# Patient Record
Sex: Female | Born: 1950 | Race: White | Hispanic: No | Smoking: Current every day smoker
Health system: Southern US, Community
[De-identification: ages and names within clinical notes are randomized; demographics above are authoritative.]

## PROBLEM LIST (undated history)

## (undated) DIAGNOSIS — Z87442 Personal history of urinary calculi: Secondary | ICD-10-CM

## (undated) DIAGNOSIS — M545 Low back pain, unspecified: Secondary | ICD-10-CM

## (undated) DIAGNOSIS — F410 Panic disorder [episodic paroxysmal anxiety] without agoraphobia: Secondary | ICD-10-CM

## (undated) DIAGNOSIS — I82409 Acute embolism and thrombosis of unspecified deep veins of unspecified lower extremity: Secondary | ICD-10-CM

## (undated) DIAGNOSIS — K589 Irritable bowel syndrome without diarrhea: Secondary | ICD-10-CM

## (undated) DIAGNOSIS — I10 Essential (primary) hypertension: Secondary | ICD-10-CM

## (undated) DIAGNOSIS — R197 Diarrhea, unspecified: Secondary | ICD-10-CM

## (undated) DIAGNOSIS — R29898 Other symptoms and signs involving the musculoskeletal system: Secondary | ICD-10-CM

## (undated) DIAGNOSIS — I2510 Atherosclerotic heart disease of native coronary artery without angina pectoris: Secondary | ICD-10-CM

## (undated) DIAGNOSIS — M5430 Sciatica, unspecified side: Secondary | ICD-10-CM

## (undated) DIAGNOSIS — Z720 Tobacco use: Secondary | ICD-10-CM

## (undated) DIAGNOSIS — K219 Gastro-esophageal reflux disease without esophagitis: Secondary | ICD-10-CM

## (undated) DIAGNOSIS — M51369 Other intervertebral disc degeneration, lumbar region without mention of lumbar back pain or lower extremity pain: Secondary | ICD-10-CM

## (undated) DIAGNOSIS — R519 Headache, unspecified: Secondary | ICD-10-CM

## (undated) DIAGNOSIS — E785 Hyperlipidemia, unspecified: Secondary | ICD-10-CM

## (undated) DIAGNOSIS — F32A Depression, unspecified: Secondary | ICD-10-CM

## (undated) DIAGNOSIS — I739 Peripheral vascular disease, unspecified: Secondary | ICD-10-CM

## (undated) DIAGNOSIS — Z923 Personal history of irradiation: Secondary | ICD-10-CM

## (undated) DIAGNOSIS — E039 Hypothyroidism, unspecified: Secondary | ICD-10-CM

## (undated) DIAGNOSIS — G629 Polyneuropathy, unspecified: Secondary | ICD-10-CM

## (undated) DIAGNOSIS — S68411A Complete traumatic amputation of right hand at wrist level, initial encounter: Secondary | ICD-10-CM

## (undated) DIAGNOSIS — F329 Major depressive disorder, single episode, unspecified: Secondary | ICD-10-CM

## (undated) DIAGNOSIS — D649 Anemia, unspecified: Secondary | ICD-10-CM

## (undated) DIAGNOSIS — N200 Calculus of kidney: Secondary | ICD-10-CM

## (undated) DIAGNOSIS — C3490 Malignant neoplasm of unspecified part of unspecified bronchus or lung: Secondary | ICD-10-CM

## (undated) DIAGNOSIS — J449 Chronic obstructive pulmonary disease, unspecified: Secondary | ICD-10-CM

## (undated) DIAGNOSIS — I214 Non-ST elevation (NSTEMI) myocardial infarction: Secondary | ICD-10-CM

## (undated) DIAGNOSIS — I959 Hypotension, unspecified: Secondary | ICD-10-CM

## (undated) DIAGNOSIS — E109 Type 1 diabetes mellitus without complications: Secondary | ICD-10-CM

## (undated) DIAGNOSIS — R251 Tremor, unspecified: Secondary | ICD-10-CM

## (undated) DIAGNOSIS — F419 Anxiety disorder, unspecified: Secondary | ICD-10-CM

## (undated) DIAGNOSIS — M5136 Other intervertebral disc degeneration, lumbar region: Secondary | ICD-10-CM

## (undated) HISTORY — DX: Low back pain, unspecified: M54.50

## (undated) HISTORY — DX: Gastro-esophageal reflux disease without esophagitis: K21.9

## (undated) HISTORY — DX: Type 1 diabetes mellitus without complications: E10.9

## (undated) HISTORY — DX: Hypothyroidism, unspecified: E03.9

## (undated) HISTORY — DX: Atherosclerotic heart disease of native coronary artery without angina pectoris: I25.10

## (undated) HISTORY — DX: Calculus of kidney: N20.0

## (undated) HISTORY — PX: HAND AMPUTATION: SUR26

## (undated) HISTORY — DX: Low back pain: M54.5

## (undated) HISTORY — DX: Polyneuropathy, unspecified: G62.9

## (undated) HISTORY — DX: Hyperlipidemia, unspecified: E78.5

## (undated) HISTORY — DX: Complete traumatic amputation of right hand at wrist level, initial encounter: S68.411A

## (undated) HISTORY — DX: Acute embolism and thrombosis of unspecified deep veins of unspecified lower extremity: I82.409

## (undated) HISTORY — DX: Depression, unspecified: F32.A

## (undated) HISTORY — DX: Personal history of irradiation: Z92.3

## (undated) HISTORY — DX: Tobacco use: Z72.0

## (undated) HISTORY — DX: Malignant neoplasm of unspecified part of unspecified bronchus or lung: C34.90

## (undated) HISTORY — PX: BACK SURGERY: SHX140

## (undated) HISTORY — PX: CHOLECYSTECTOMY: SHX55

## (undated) HISTORY — PX: SHOULDER SURGERY: SHX246

## (undated) HISTORY — DX: Major depressive disorder, single episode, unspecified: F32.9

## (undated) HISTORY — PX: KNEE SURGERY: SHX244

---

## 1972-10-15 HISTORY — PX: DILATION AND CURETTAGE OF UTERUS: SHX78

## 1976-10-15 HISTORY — PX: PARTIAL HYSTERECTOMY: SHX80

## 1982-10-15 DIAGNOSIS — S68411A Complete traumatic amputation of right hand at wrist level, initial encounter: Secondary | ICD-10-CM

## 1982-10-15 HISTORY — DX: Complete traumatic amputation of right hand at wrist level, initial encounter: S68.411A

## 1995-01-14 DIAGNOSIS — I219 Acute myocardial infarction, unspecified: Secondary | ICD-10-CM

## 1995-01-14 HISTORY — DX: Acute myocardial infarction, unspecified: I21.9

## 1997-11-15 ENCOUNTER — Ambulatory Visit (HOSPITAL_COMMUNITY): Admission: RE | Admit: 1997-11-15 | Discharge: 1997-11-15 | Payer: Self-pay | Admitting: Orthopedic Surgery

## 1999-01-20 ENCOUNTER — Ambulatory Visit (HOSPITAL_COMMUNITY): Admission: RE | Admit: 1999-01-20 | Discharge: 1999-01-20 | Payer: Self-pay | Admitting: Cardiovascular Disease

## 1999-05-06 ENCOUNTER — Encounter: Payer: Self-pay | Admitting: Emergency Medicine

## 1999-05-06 ENCOUNTER — Emergency Department (HOSPITAL_COMMUNITY): Admission: EM | Admit: 1999-05-06 | Discharge: 1999-05-06 | Payer: Self-pay | Admitting: Emergency Medicine

## 2000-03-21 ENCOUNTER — Encounter: Payer: Self-pay | Admitting: Orthopedic Surgery

## 2000-03-21 ENCOUNTER — Ambulatory Visit (HOSPITAL_COMMUNITY): Admission: RE | Admit: 2000-03-21 | Discharge: 2000-03-21 | Payer: Self-pay | Admitting: Orthopedic Surgery

## 2000-10-15 HISTORY — PX: TOTAL ABDOMINAL HYSTERECTOMY W/ BILATERAL SALPINGOOPHORECTOMY: SHX83

## 2000-10-27 ENCOUNTER — Emergency Department (HOSPITAL_COMMUNITY): Admission: EM | Admit: 2000-10-27 | Discharge: 2000-10-27 | Payer: Self-pay | Admitting: Emergency Medicine

## 2000-10-27 ENCOUNTER — Encounter: Payer: Self-pay | Admitting: Emergency Medicine

## 2000-10-29 ENCOUNTER — Encounter: Payer: Self-pay | Admitting: Emergency Medicine

## 2000-10-29 ENCOUNTER — Inpatient Hospital Stay (HOSPITAL_COMMUNITY): Admission: EM | Admit: 2000-10-29 | Discharge: 2000-10-30 | Payer: Self-pay | Admitting: Emergency Medicine

## 2000-11-01 ENCOUNTER — Encounter: Admission: RE | Admit: 2000-11-01 | Discharge: 2000-11-01 | Payer: Self-pay

## 2000-11-01 ENCOUNTER — Encounter: Payer: Self-pay | Admitting: *Deleted

## 2000-11-01 ENCOUNTER — Ambulatory Visit (HOSPITAL_COMMUNITY): Admission: RE | Admit: 2000-11-01 | Discharge: 2000-11-01 | Payer: Self-pay | Admitting: *Deleted

## 2001-01-07 ENCOUNTER — Encounter: Admission: RE | Admit: 2001-01-07 | Discharge: 2001-04-07 | Payer: Self-pay | Admitting: Orthopedic Surgery

## 2001-09-29 ENCOUNTER — Ambulatory Visit (HOSPITAL_COMMUNITY): Admission: RE | Admit: 2001-09-29 | Discharge: 2001-09-29 | Payer: Self-pay | Admitting: Obstetrics and Gynecology

## 2001-09-29 ENCOUNTER — Encounter: Payer: Self-pay | Admitting: Obstetrics and Gynecology

## 2002-09-08 ENCOUNTER — Ambulatory Visit (HOSPITAL_COMMUNITY): Admission: RE | Admit: 2002-09-08 | Discharge: 2002-09-08 | Payer: Self-pay | Admitting: Obstetrics and Gynecology

## 2002-09-08 ENCOUNTER — Encounter: Payer: Self-pay | Admitting: Obstetrics and Gynecology

## 2002-09-14 ENCOUNTER — Inpatient Hospital Stay (HOSPITAL_COMMUNITY): Admission: RE | Admit: 2002-09-14 | Discharge: 2002-09-15 | Payer: Self-pay | Admitting: Obstetrics and Gynecology

## 2002-10-28 ENCOUNTER — Inpatient Hospital Stay (HOSPITAL_COMMUNITY): Admission: AD | Admit: 2002-10-28 | Discharge: 2002-10-29 | Payer: Self-pay | Admitting: Cardiology

## 2002-10-29 ENCOUNTER — Encounter: Payer: Self-pay | Admitting: Cardiology

## 2002-11-05 ENCOUNTER — Ambulatory Visit (HOSPITAL_COMMUNITY): Admission: RE | Admit: 2002-11-05 | Discharge: 2002-11-05 | Payer: Self-pay | Admitting: Obstetrics and Gynecology

## 2002-11-05 ENCOUNTER — Encounter: Payer: Self-pay | Admitting: Obstetrics and Gynecology

## 2004-09-12 ENCOUNTER — Ambulatory Visit: Payer: Self-pay | Admitting: Pulmonary Disease

## 2004-10-15 DIAGNOSIS — N200 Calculus of kidney: Secondary | ICD-10-CM

## 2004-10-15 HISTORY — DX: Calculus of kidney: N20.0

## 2005-02-14 ENCOUNTER — Ambulatory Visit (HOSPITAL_COMMUNITY): Admission: RE | Admit: 2005-02-14 | Discharge: 2005-02-14 | Payer: Self-pay | Admitting: Obstetrics & Gynecology

## 2005-02-21 ENCOUNTER — Ambulatory Visit (HOSPITAL_COMMUNITY): Admission: RE | Admit: 2005-02-21 | Discharge: 2005-02-21 | Payer: Self-pay | Admitting: Urology

## 2005-02-26 ENCOUNTER — Ambulatory Visit (HOSPITAL_COMMUNITY): Admission: RE | Admit: 2005-02-26 | Discharge: 2005-02-26 | Payer: Self-pay | Admitting: Urology

## 2005-03-07 ENCOUNTER — Encounter: Admission: RE | Admit: 2005-03-07 | Discharge: 2005-03-07 | Payer: Self-pay | Admitting: Obstetrics and Gynecology

## 2005-03-20 ENCOUNTER — Ambulatory Visit: Payer: Self-pay | Admitting: Internal Medicine

## 2005-04-24 ENCOUNTER — Ambulatory Visit: Payer: Self-pay | Admitting: Family Medicine

## 2005-04-30 ENCOUNTER — Ambulatory Visit: Payer: Self-pay | Admitting: Internal Medicine

## 2005-05-03 ENCOUNTER — Encounter (HOSPITAL_COMMUNITY): Admission: RE | Admit: 2005-05-03 | Discharge: 2005-06-02 | Payer: Self-pay | Admitting: Family Medicine

## 2005-05-07 ENCOUNTER — Ambulatory Visit (HOSPITAL_COMMUNITY): Admission: RE | Admit: 2005-05-07 | Discharge: 2005-05-07 | Payer: Self-pay | Admitting: Internal Medicine

## 2005-05-14 ENCOUNTER — Ambulatory Visit: Payer: Self-pay | Admitting: Family Medicine

## 2005-05-15 ENCOUNTER — Ambulatory Visit (HOSPITAL_COMMUNITY): Admission: RE | Admit: 2005-05-15 | Discharge: 2005-05-15 | Payer: Self-pay | Admitting: Internal Medicine

## 2005-05-25 ENCOUNTER — Ambulatory Visit (HOSPITAL_COMMUNITY): Admission: RE | Admit: 2005-05-25 | Discharge: 2005-05-25 | Payer: Self-pay | Admitting: Urology

## 2005-05-29 ENCOUNTER — Ambulatory Visit (HOSPITAL_COMMUNITY): Admission: RE | Admit: 2005-05-29 | Discharge: 2005-05-29 | Payer: Self-pay | Admitting: Urology

## 2005-06-06 ENCOUNTER — Encounter (HOSPITAL_COMMUNITY): Admission: RE | Admit: 2005-06-06 | Discharge: 2005-07-06 | Payer: Self-pay | Admitting: Family Medicine

## 2005-06-22 ENCOUNTER — Ambulatory Visit: Payer: Self-pay | Admitting: *Deleted

## 2005-06-25 ENCOUNTER — Ambulatory Visit: Payer: Self-pay | Admitting: Family Medicine

## 2005-06-27 ENCOUNTER — Ambulatory Visit: Payer: Self-pay | Admitting: Family Medicine

## 2005-07-03 ENCOUNTER — Ambulatory Visit: Payer: Self-pay | Admitting: Family Medicine

## 2005-07-03 ENCOUNTER — Ambulatory Visit (HOSPITAL_COMMUNITY): Admission: RE | Admit: 2005-07-03 | Discharge: 2005-07-03 | Payer: Self-pay | Admitting: Family Medicine

## 2005-07-05 ENCOUNTER — Ambulatory Visit: Payer: Self-pay | Admitting: Cardiology

## 2005-07-05 ENCOUNTER — Encounter (HOSPITAL_COMMUNITY): Admission: RE | Admit: 2005-07-05 | Discharge: 2005-07-05 | Payer: Self-pay | Admitting: *Deleted

## 2005-08-06 ENCOUNTER — Ambulatory Visit (HOSPITAL_COMMUNITY): Admission: RE | Admit: 2005-08-06 | Discharge: 2005-08-06 | Payer: Self-pay | Admitting: Family Medicine

## 2005-08-06 ENCOUNTER — Ambulatory Visit: Payer: Self-pay | Admitting: *Deleted

## 2005-08-06 ENCOUNTER — Ambulatory Visit: Payer: Self-pay | Admitting: Family Medicine

## 2005-09-11 ENCOUNTER — Ambulatory Visit: Payer: Self-pay | Admitting: Family Medicine

## 2005-10-30 ENCOUNTER — Ambulatory Visit: Payer: Self-pay | Admitting: Family Medicine

## 2005-10-30 IMAGING — CT CT ABDOMEN W/O CM
1 of 2 series · 15 of 32 positions shown, 19 images · IV contrast (agent unspecified)
Comparison: none

CLINICAL DATA: Abdominal pain with hepatic enlargement and diarrhea.
TECHNIQUE: Multidetector helical CT imaging of the abdomen and pelvis was performed following oral contrast only as specifically requested.  The patient is reportedly allergic to iodinated contrast and IV contrast was not given.  
CT OF THE ABDOMEN WITHOUT CONTRAST:
The lung bases are clear.  There is no pleural effusion.  The liver is enlarged and demonstrates diffuse low attenuation compatible with fatty infiltration.  Hepatic assessment is limited by the lack of intravenous contrast.  The gallbladder is surgically absent.   The spleen, pancreas, and adrenal glands appear normal.  There is moderate right and mild left-sided hydronephrosis without perinephric inflammatory change.   There is a non-obstructing calculus in the lower pole of the left kidney measuring 4 mm in diameter.  Bilateral ureteral calculi are demonstrated.  These are described under the pelvic findings below.

[Series 8366: — · axial · 0.81mm/px · z∈[+1050,+1490]mm · 15 of 96 slices shown, 19 images]
[im 4/96  soft-tissue]
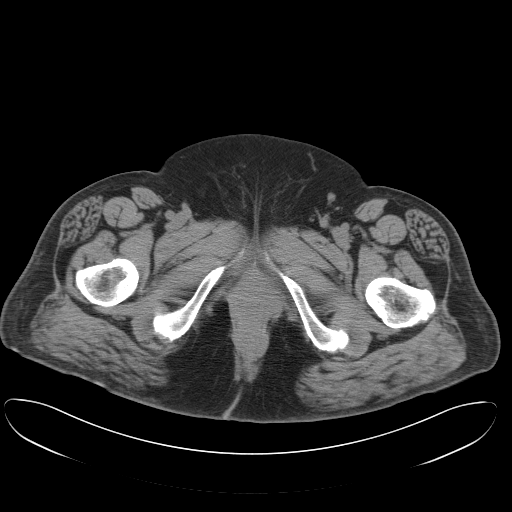
[im 4/96  bone]
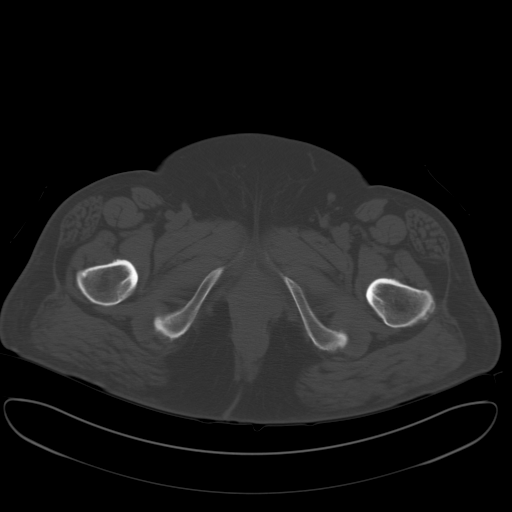
[im 12/96  soft-tissue]
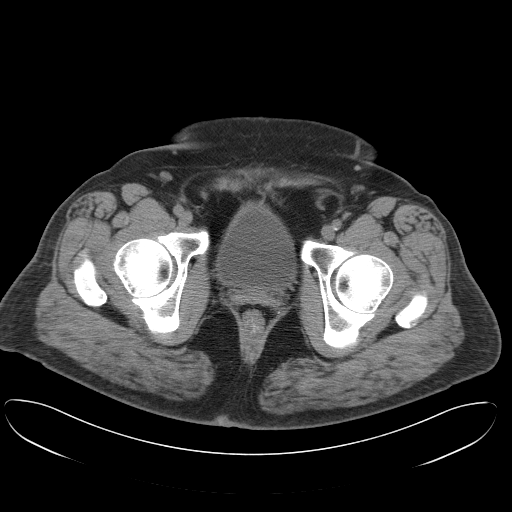
[im 20/96  soft-tissue]
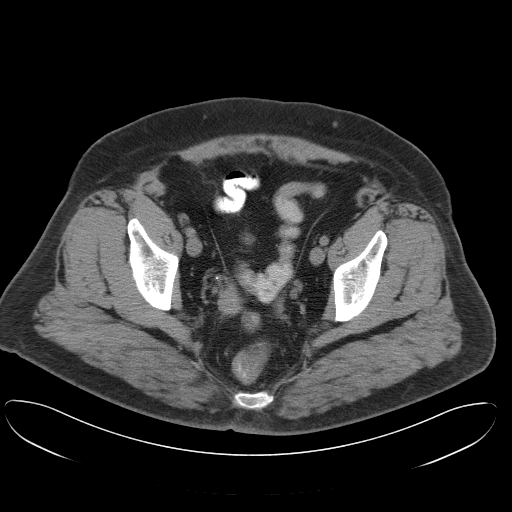
[im 28/96  soft-tissue]
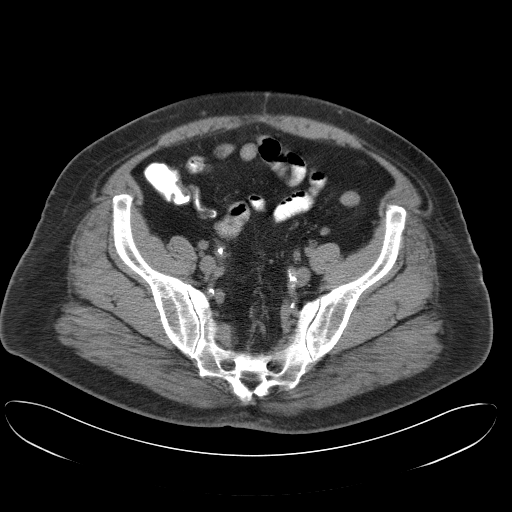
[im 32/96  soft-tissue]
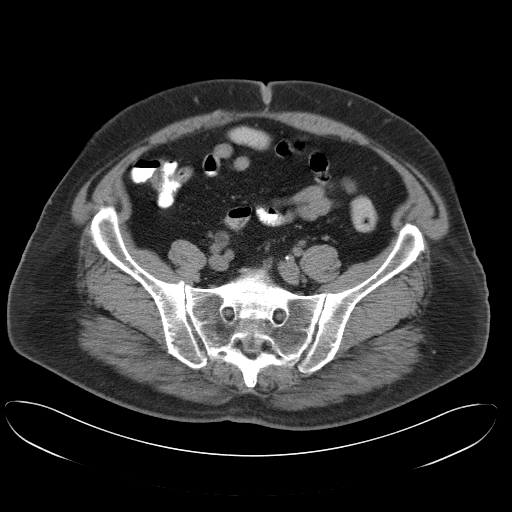
[im 40/96  soft-tissue]
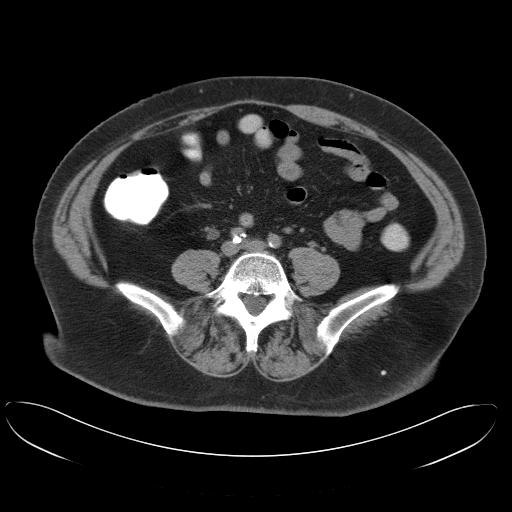
[im 48/96  soft-tissue]
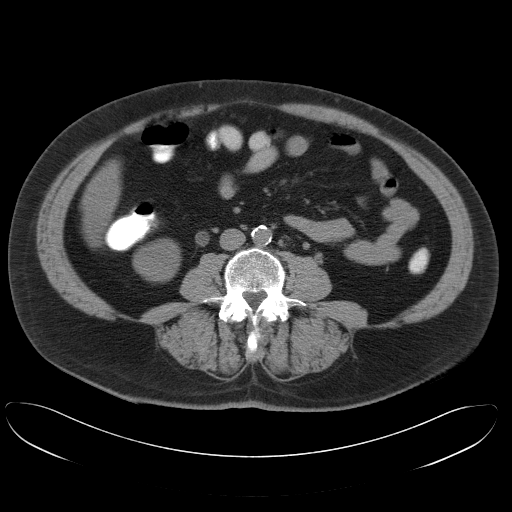
[im 56/96  soft-tissue]
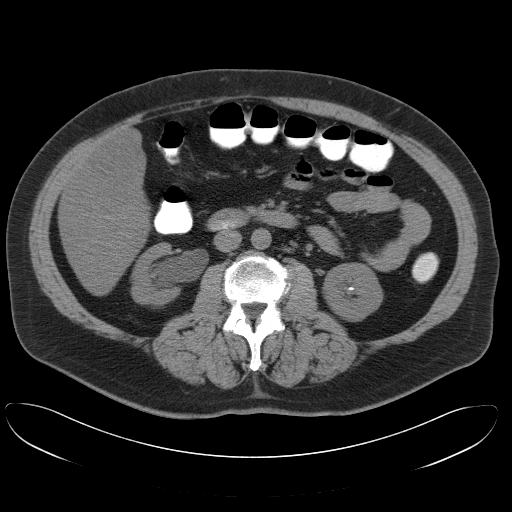
[im 64/96  soft-tissue]
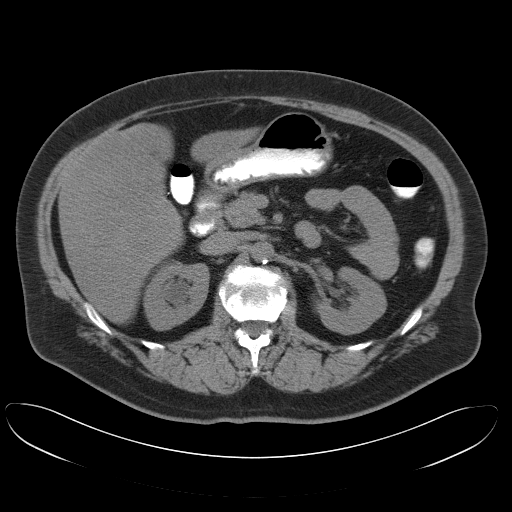
[im 64/96  bone]
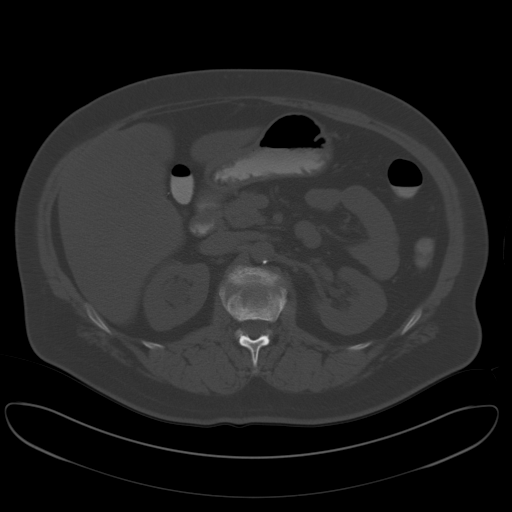
[im 68/96  soft-tissue]
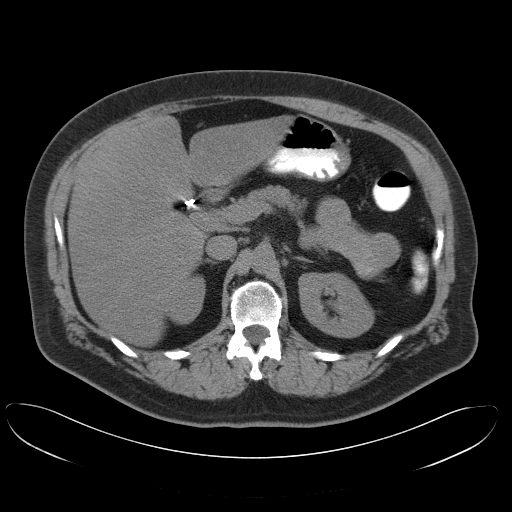
[im 76/96  soft-tissue]
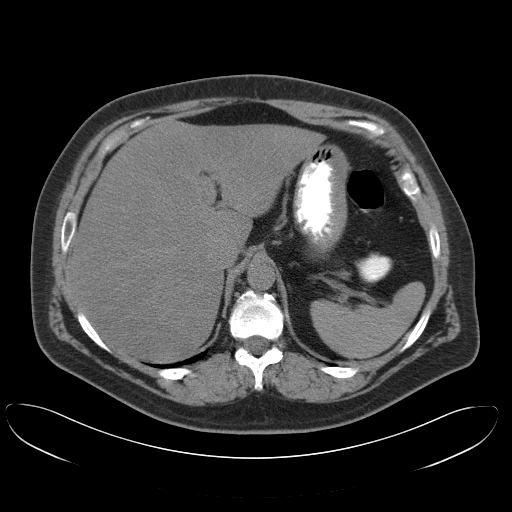
[im 80/96  lung]
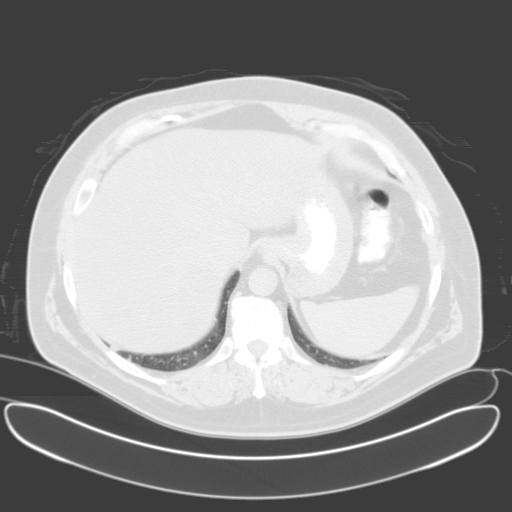
[im 84/96  soft-tissue]
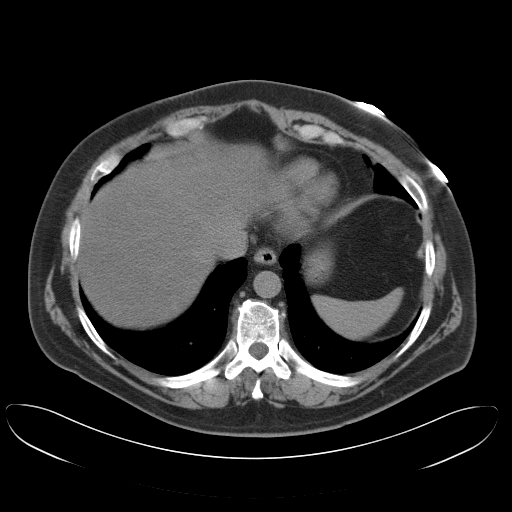
[im 84/96  lung]
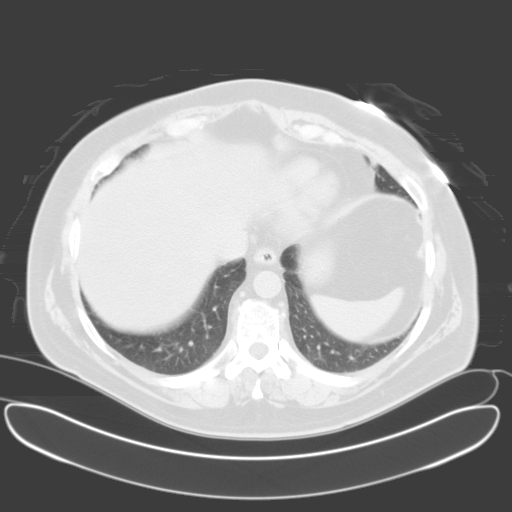
[im 88/96  lung]
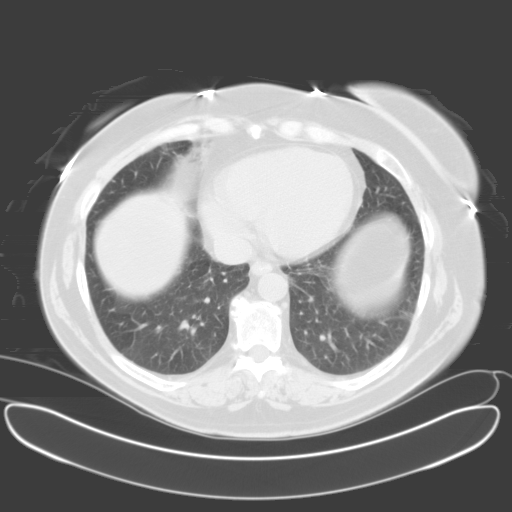
[im 92/96  soft-tissue]
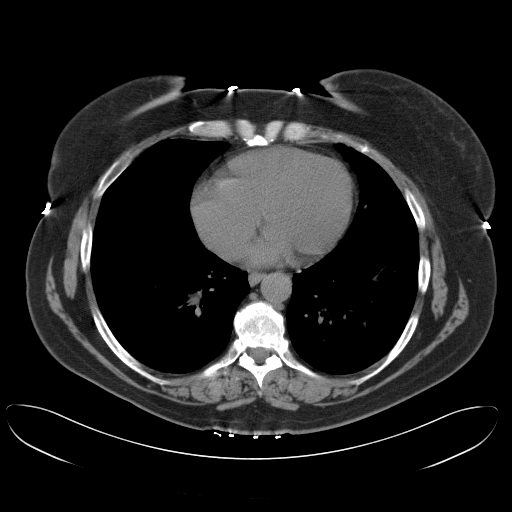
[im 92/96  lung]
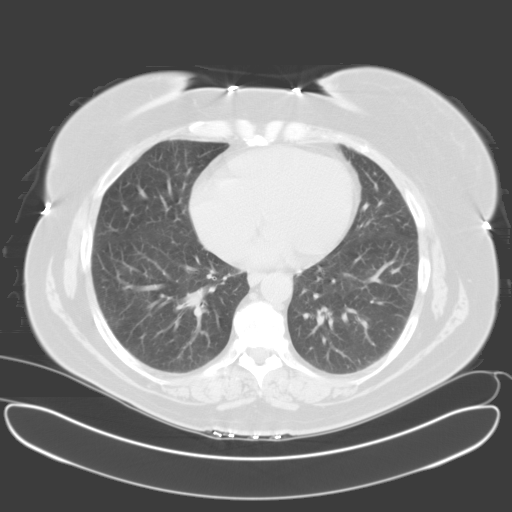

[15 of 32 positions shown; findings below may reference images not displayed]

IMPRESSION: 1.  Hepatomegaly with fatty infiltration.  Liver assessment is limited by the lack of IV contrast. 
2.  Bilateral hydronephrosis secondary to bilateral ureteral calculi.  See below.
CT PELVIS WITHOUT CONTRAST:
There is moderate dilatation of the right ureter.  A 5 mm calculus is present in the distal right ureter within the false pelvis on image 69.  More distally, there is a 5 mm calculus just proximal to the right ureterovesical junction.  The patient also has a 3 mm calculus in the distal left ureter at the ureterovesical junction associated with mild dilatation of the ureter.  The uterus is surgically absent.  There is no pelvic mass.
IMPRESSION: Bilateral distal ureteral calculi with right greater than left hydronephrosis.  Urologic consultation is recommended.

## 2005-11-06 IMAGING — CR DG ABDOMEN 1V
1 series · 1 of 1 positions shown · non-contrast
Comparison: CT dated 02/14/2005.

CLINICAL DATA: Bilateral renal/ureteral calculi.
ABDOMEN - ONE VIEW:

[view not recorded]
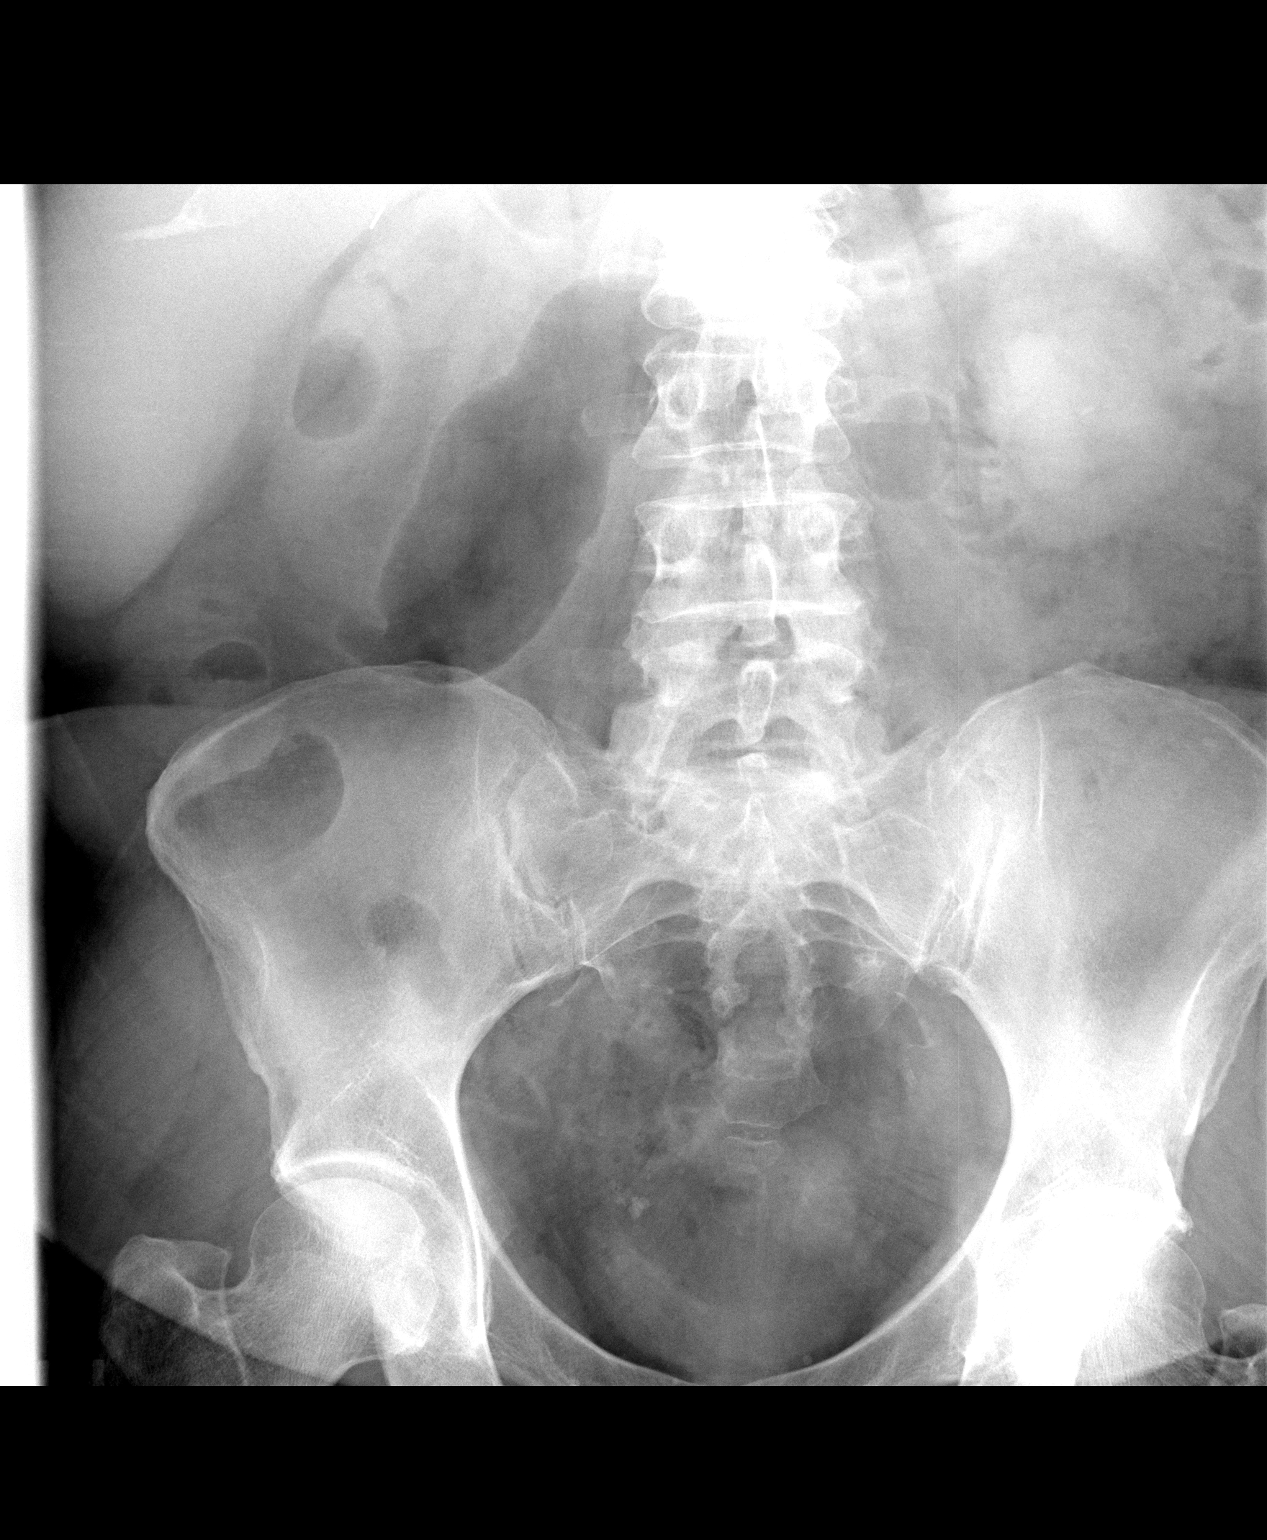

[1 of 1 positions shown; findings below may reference images not displayed]

Calcification overlying the lower pole of the left kidney are compatible with renal calculus.    Faint calcifications in the lower anatomic pelvis bilaterally may represent the patient?s known distal ureteral calculi.  Some of these calcifications do represent phleboliths.  Bowel gas pattern is unremarkable.  The patient is status post cholecystectomy.
IMPRESSION: 1.  Pelvic calcifications may represent this patient?s known distal ureteral calculi.  
2.  Left lower pole renal calculus.

## 2005-12-17 ENCOUNTER — Ambulatory Visit: Payer: Self-pay | Admitting: Family Medicine

## 2005-12-21 ENCOUNTER — Ambulatory Visit (HOSPITAL_COMMUNITY): Admission: RE | Admit: 2005-12-21 | Discharge: 2005-12-21 | Payer: Self-pay | Admitting: Family Medicine

## 2006-01-03 ENCOUNTER — Ambulatory Visit: Payer: Self-pay | Admitting: Family Medicine

## 2006-01-20 IMAGING — US US ABDOMEN COMPLETE
1 series · 13 of 25 positions shown · non-contrast
Comparison: none

CLINICAL DATA: Right upper quadrant abdominal pain.  History of kidney stones.  Hepatomegaly.  
 ABDOMEN ULTRASOUND:
TECHNIQUE: Complete abdominal ultrasound examination was performed including evaluation of the liver, gallbladder, bile ducts, pancreas, kidneys, spleen, IVC, and abdominal aorta. 
 The gallbladder is surgically absent.  The common bile duct is nondilated measuring 4 mm.  The liver shows diffusely increased echogenicity, consistent with diffuse fatty infiltration.  The liver also is enlarged measuring approximately 21 cm in length.  No focal liver lesions are identified. 
 Visualized portions of the IVC and pancreas are unremarkable.  There is no evidence of splenomegaly.  Both kidneys are normal in size.  Moderate hydronephrosis is seen involving the right kidney.  There is no evidence of left-sided hydronephrosis.  There is no evidence off abdominal aortic aneurysm or ascites.

[Series 1: unknown · 0.34mm/px · 13 of 84 slices shown]
[im 1/84]
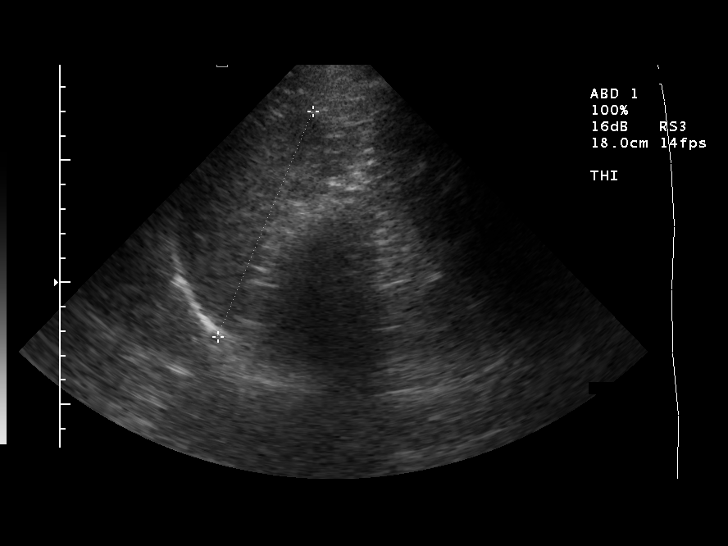
[im 7/84]
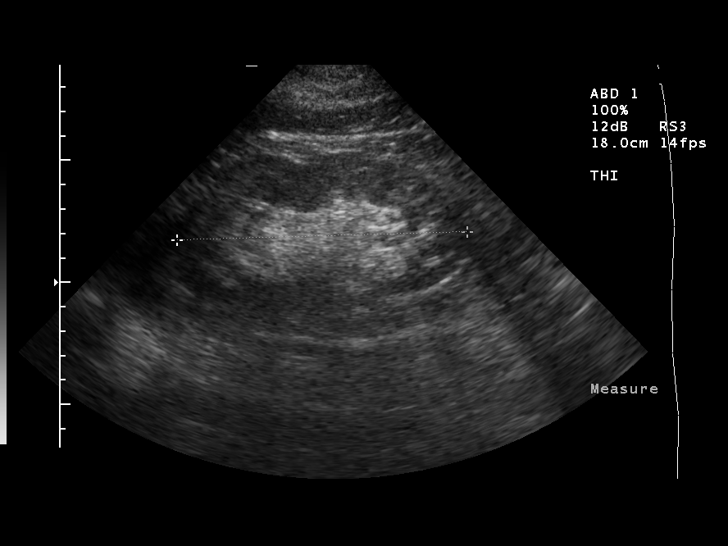
[im 14/84]
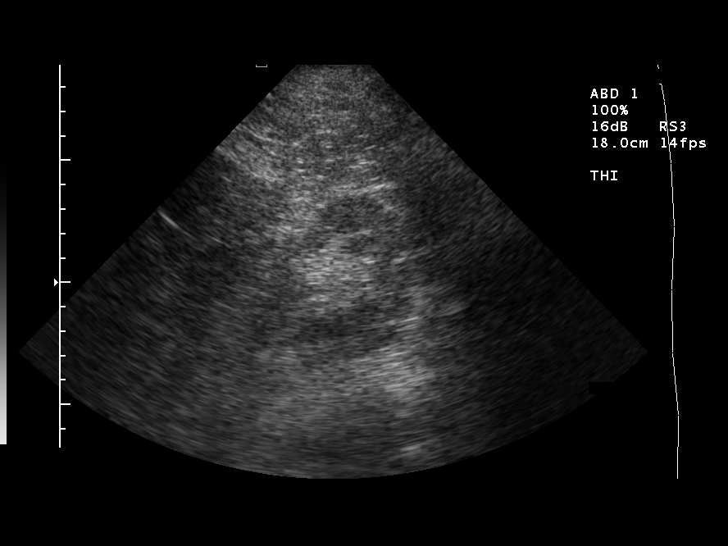
[im 21/84]
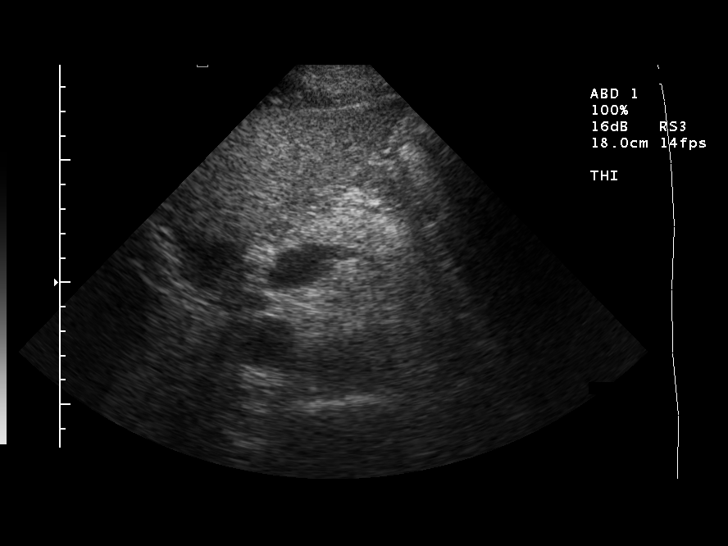
[im 28/84]
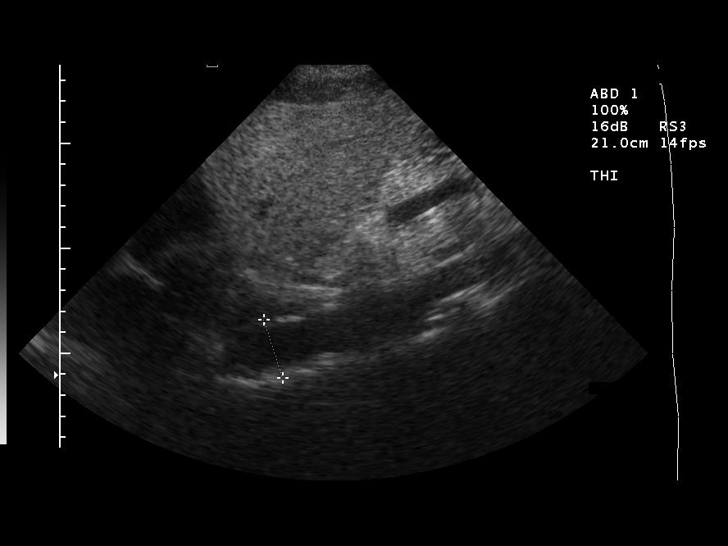
[im 35/84]
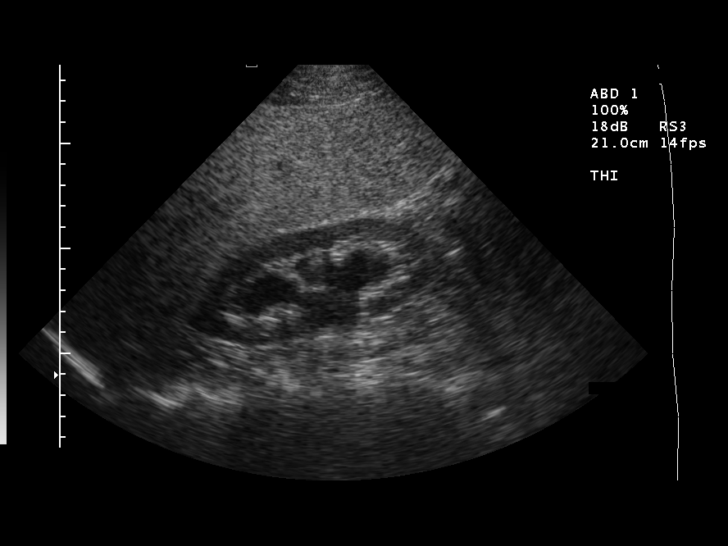
[im 42/84]
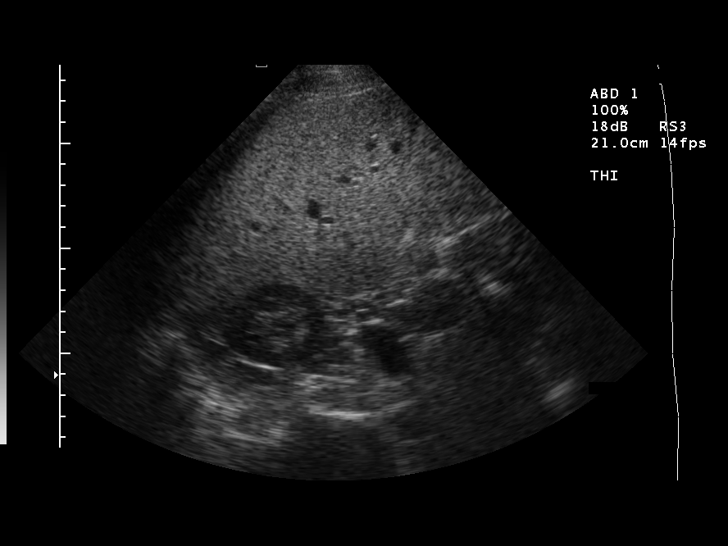
[im 49/84]
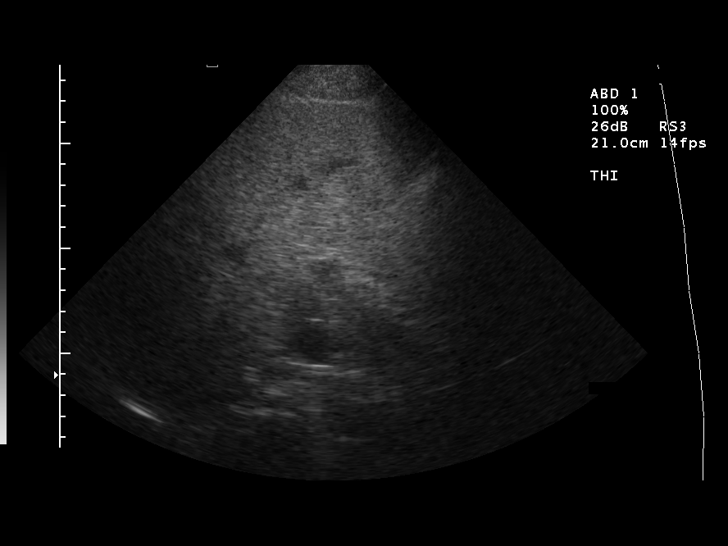
[im 56/84]
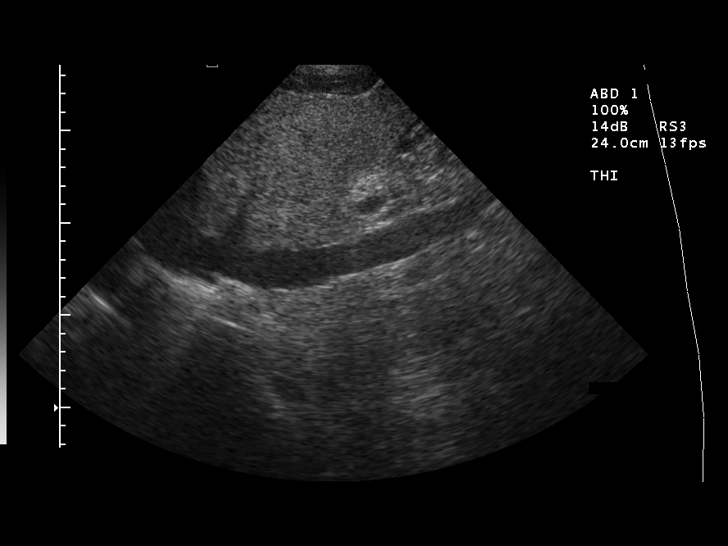
[im 63/84]
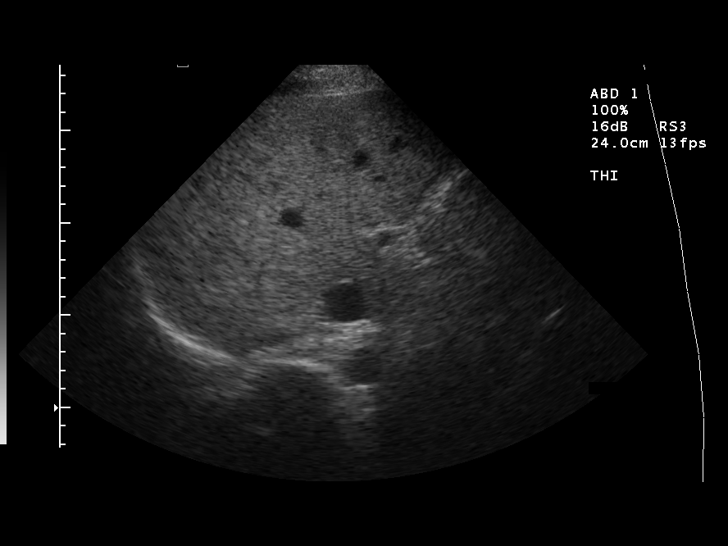
[im 70/84]
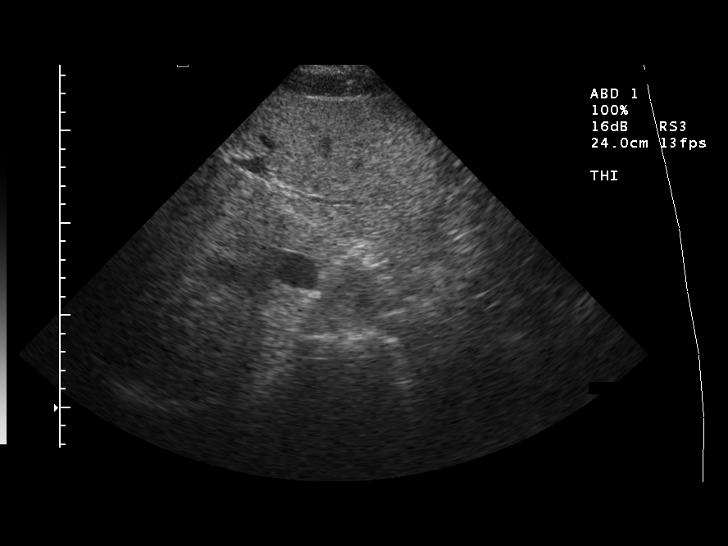
[im 77/84]
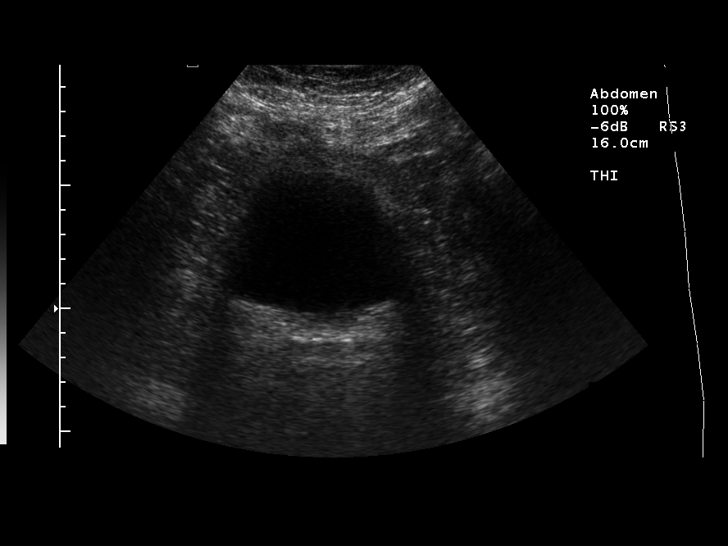
[im 84/84]
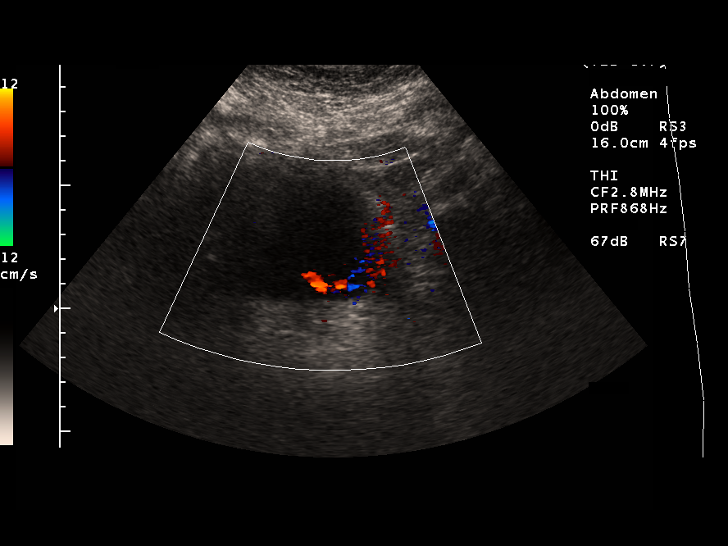

[13 of 25 positions shown; findings below may reference images not displayed]

IMPRESSION: 1.  Moderate right hydronephrosis.  Distal right ureteral obstruction cannot be excluded, and CT is recommended for further evaluation if clinically warranted. 
 2.  Previous cholecystectomy.  No evidence of biliary ductal dilatation. 
 3. Hepatomegaly and diffuse fatty infiltration of the liver.

## 2006-01-28 ENCOUNTER — Ambulatory Visit: Payer: Self-pay | Admitting: Internal Medicine

## 2006-01-28 ENCOUNTER — Encounter (INDEPENDENT_AMBULATORY_CARE_PROVIDER_SITE_OTHER): Payer: Self-pay | Admitting: *Deleted

## 2006-01-28 ENCOUNTER — Ambulatory Visit (HOSPITAL_COMMUNITY): Admission: RE | Admit: 2006-01-28 | Discharge: 2006-01-28 | Payer: Self-pay | Admitting: Internal Medicine

## 2006-01-28 IMAGING — CT CT ABDOMEN W/O CM
1 of 2 series · 15 of 32 positions shown, 19 images · IV contrast (agent unspecified)
Comparison: 02/21/05

CLINICAL DATA: Right-sided flank pain, evaluate stones
ABDOMEN CT WITHOUT CONTRAST:
TECHNIQUE: Multidetector CT imaging of the abdomen was performed following the standard protocol without IV contrast.
TECHNIQUE: Multidetector CT imaging of the pelvis was performed following the standard protocol without IV contrast.

[Series 5986: — · axial · 0.69mm/px · z∈[+1208,+1632]mm · 15 of 93 slices shown, 19 images]
[im 4/93  soft-tissue]
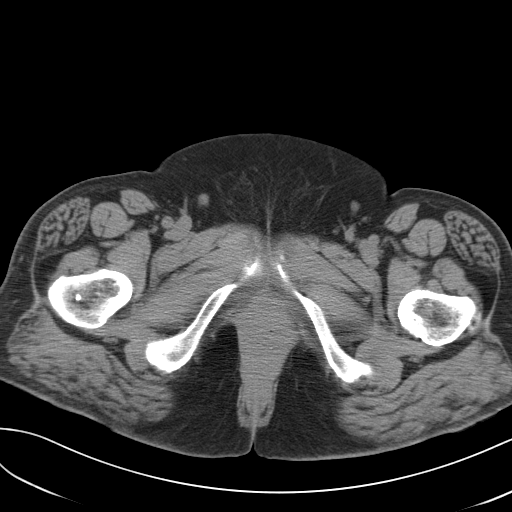
[im 4/93  bone]
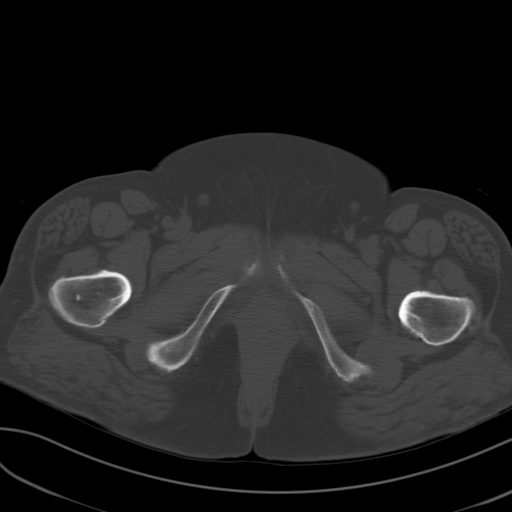
[im 12/93  soft-tissue]
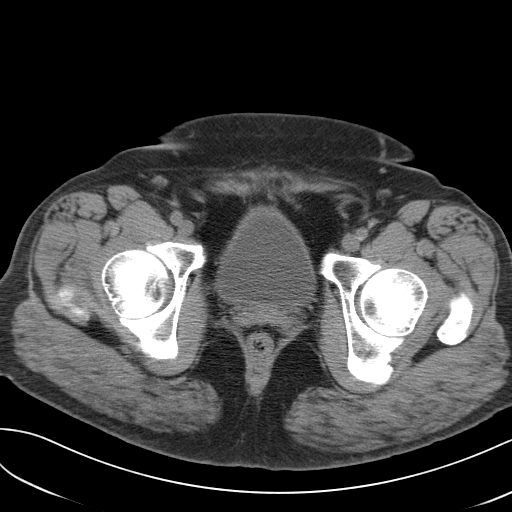
[im 19/93  soft-tissue]
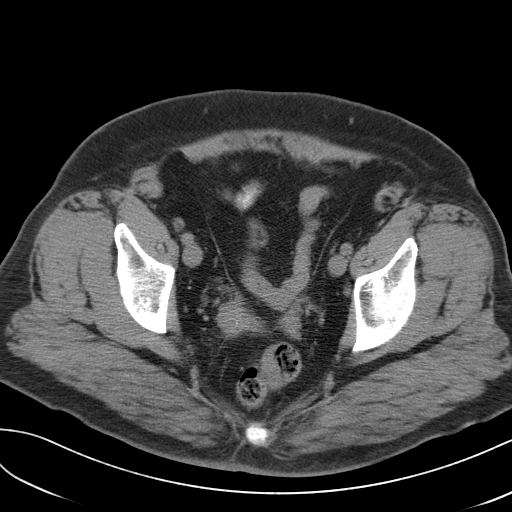
[im 26/93  soft-tissue]
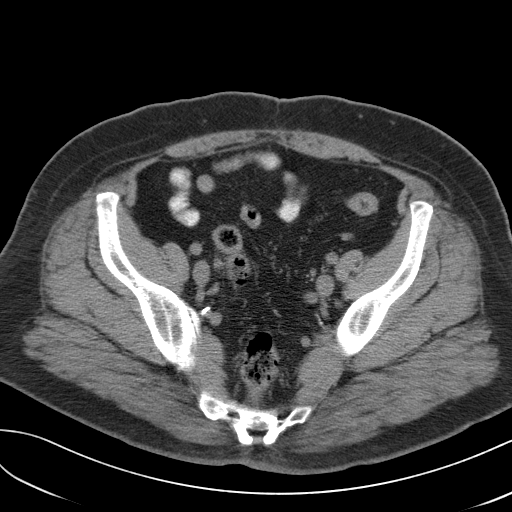
[im 34/93  soft-tissue]
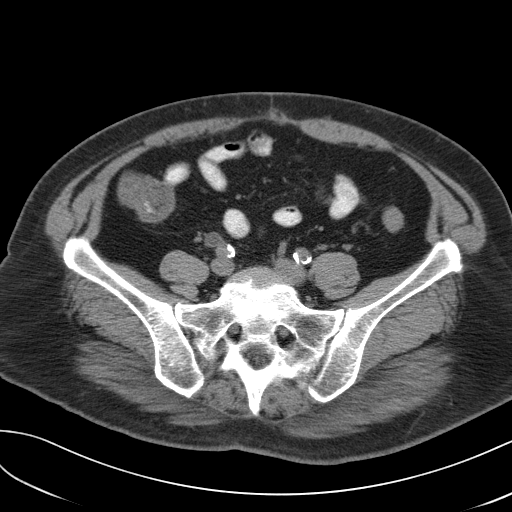
[im 41/93  soft-tissue]
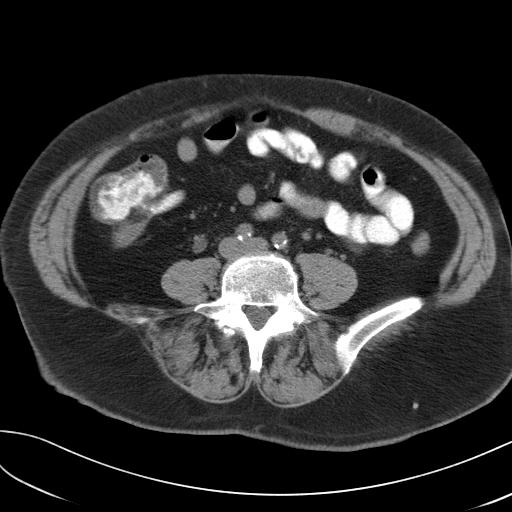
[im 48/93  soft-tissue]
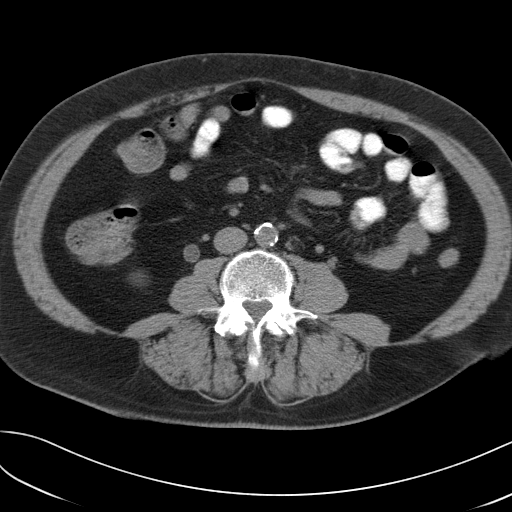
[im 52/93  soft-tissue]
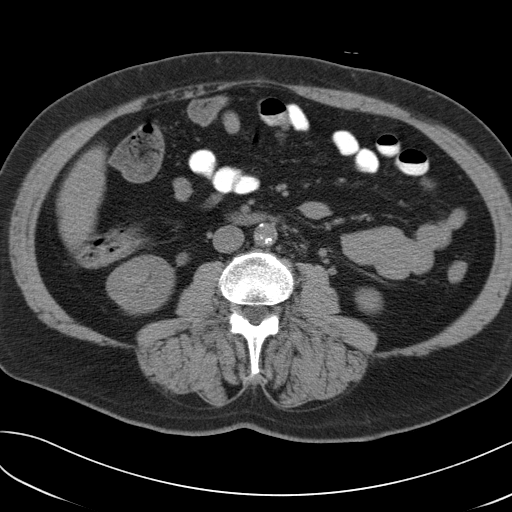
[im 59/93  soft-tissue]
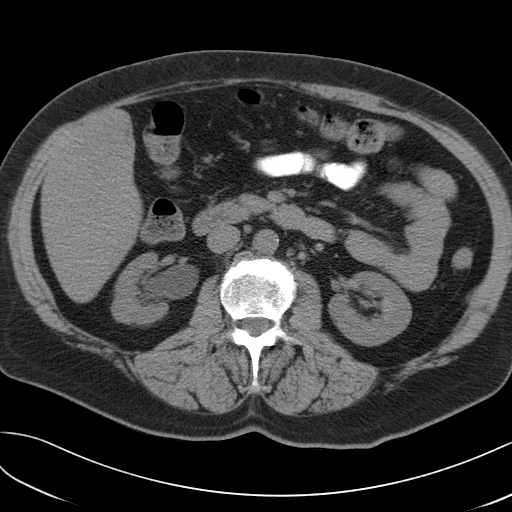
[im 59/93  bone]
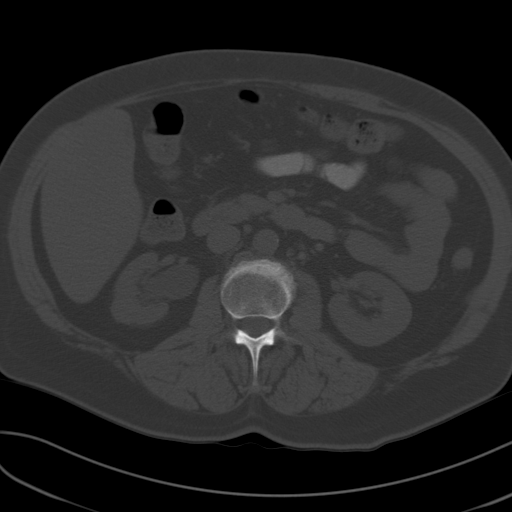
[im 67/93  soft-tissue]
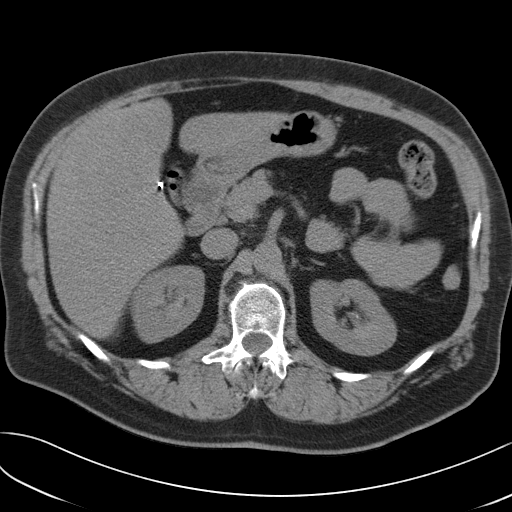
[im 74/93  soft-tissue]
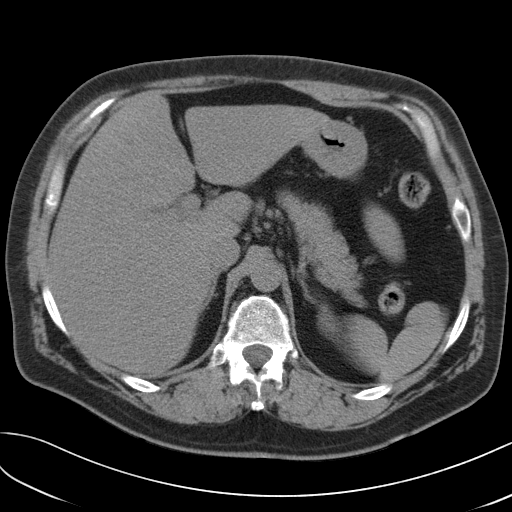
[im 78/93  lung]
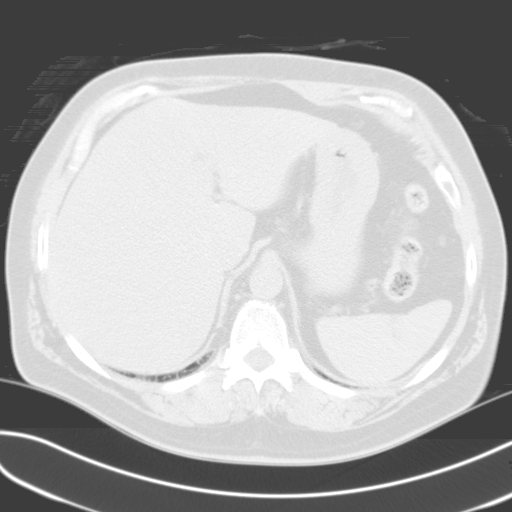
[im 81/93  soft-tissue]
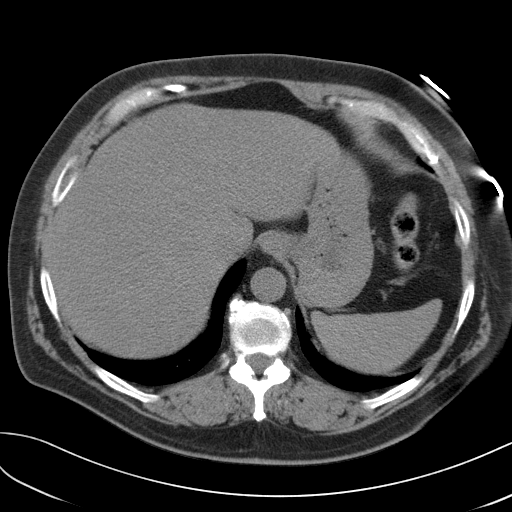
[im 81/93  lung]
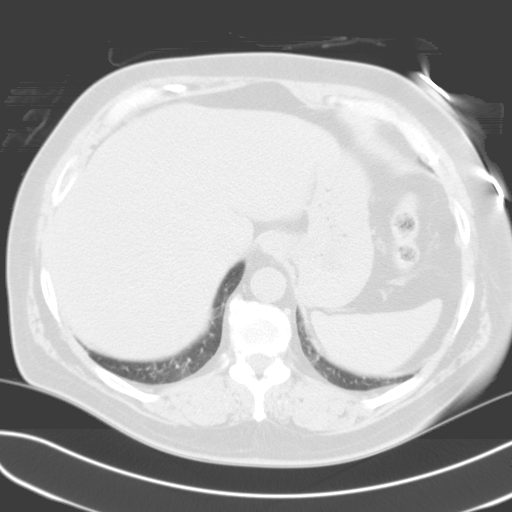
[im 85/93  lung]
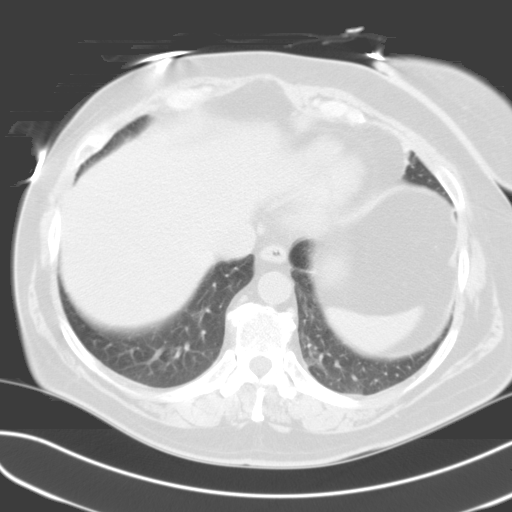
[im 89/93  soft-tissue]
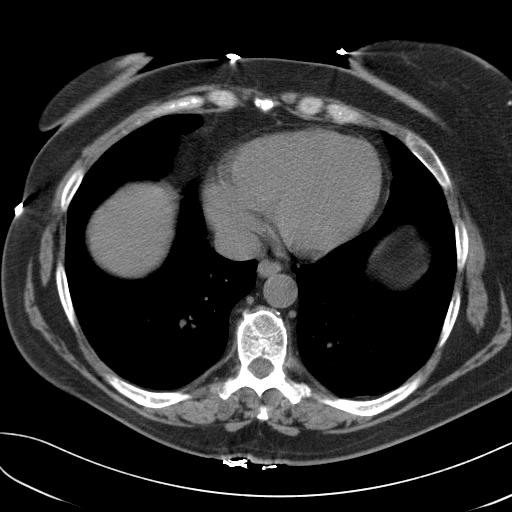
[im 89/93  lung]
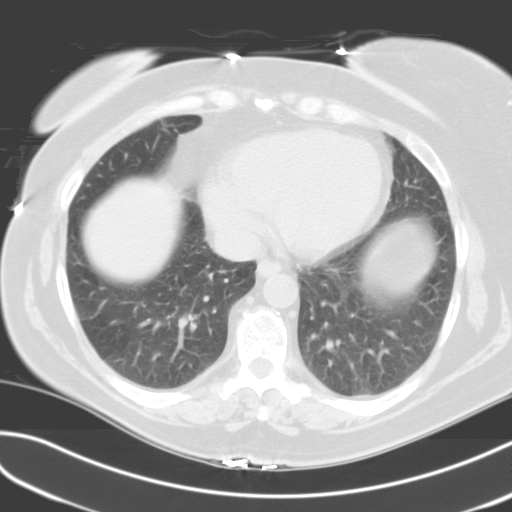

[15 of 32 positions shown; findings below may reference images not displayed]

FINDINGS: The visualized lung bases are clear.  No pleural effusions.  
Again noted is fatty infiltration of the liver.  The patient is status post cholecystectomy.  Within the limitations of non-contrast technique, I see no focal liver lesions.  
The spleen is negative.
The adrenal glands are negative.
The left kidney has a tiny less than 5 mm lower pole stone.  No left-sided hydronephrosis or hydroureter.  There is moderate right-sided hydronephrosis.  Within the mid right ureter, there is a 5 mm obstructing stone (image 67).  No additional right-sided renal stones are noted.  
There is no free abdominal fluid.  The appendix is negative.  The visualized bowel loops are unremarkable.  Negative for adenopathy.
IMPRESSION: Right-sided hydronephrosis with a 5 mm mid ureteral obstructing stone.
PELVIS CT WITHOUT CONTRAST:
FINDINGS: There is no free pelvic fluid.  No pelvic lymphadenopathy.  
No pathologically enlarged lymphadenopathy.
IMPRESSION: No acute pelvic CT findings

## 2006-01-31 ENCOUNTER — Ambulatory Visit: Payer: Self-pay | Admitting: Family Medicine

## 2006-02-05 ENCOUNTER — Ambulatory Visit: Payer: Self-pay | Admitting: Family Medicine

## 2006-02-11 IMAGING — CT CT ABDOMEN W/O CM
1 of 2 series · 13 of 32 positions shown, 19 images · IV contrast (agent unspecified)
Comparison: CT of the abdomen and pelvis 05/15/2005.

CLINICAL DATA: Right flank pain.  
ABDOMEN CT WITHOUT CONTRAST:
TECHNIQUE: Multidetector CT imaging of the abdomen was performed following the standard protocol without IV contrast.
TECHNIQUE: Multidetector CT imaging of the pelvis was performed following the standard protocol without IV contrast. 
The patient is status post hysterectomy.  I do not confidently identify ovaries.  The bowel is unremarkable.  No significant free fluid or lymphadenopathy.

[Series 7130: — · axial · 0.81mm/px · z∈[+1100,+1544]mm · 13 of 103 slices shown, 19 images]
[im 7/103  soft-tissue]
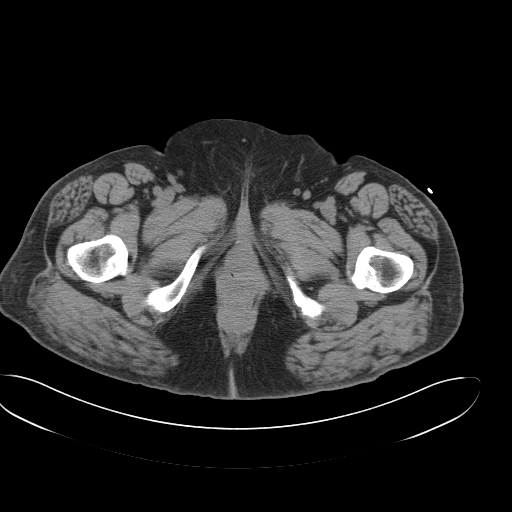
[im 7/103  bone]
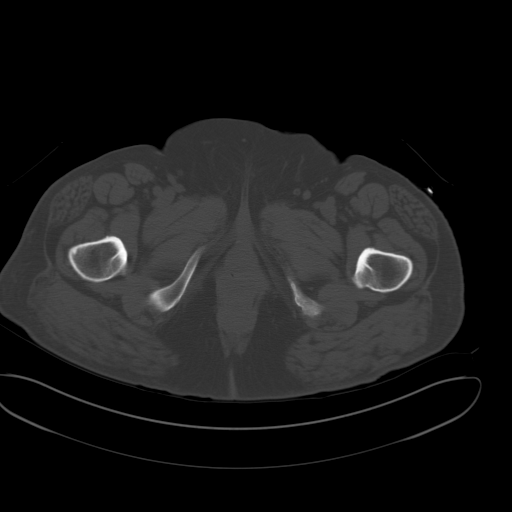
[im 14/103  soft-tissue]
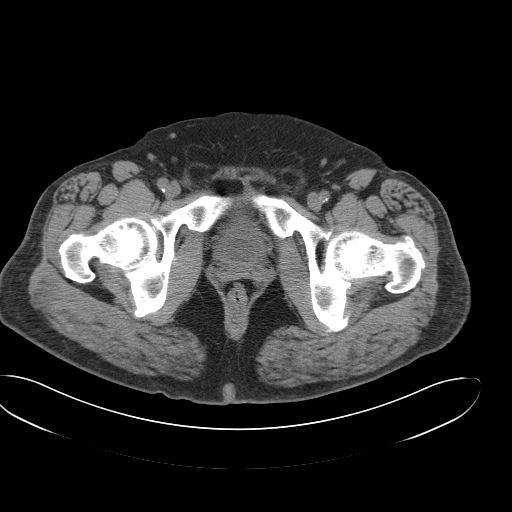
[im 21/103  soft-tissue]
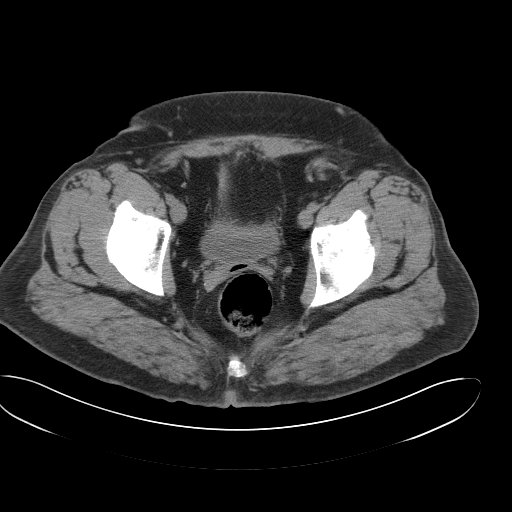
[im 28/103  soft-tissue]
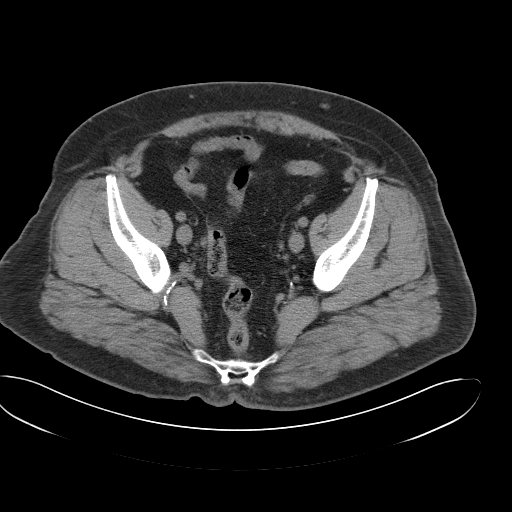
[im 35/103  soft-tissue]
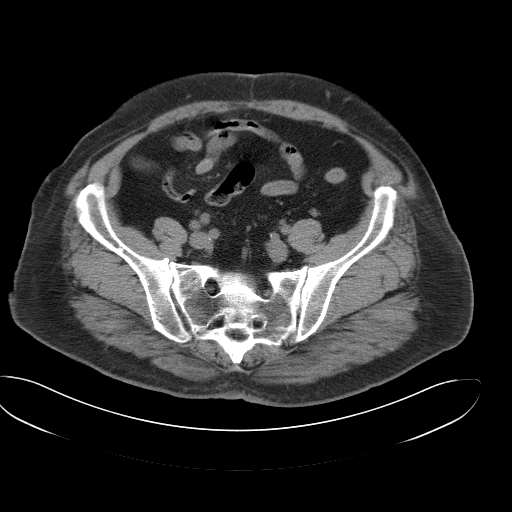
[im 41/103  soft-tissue]
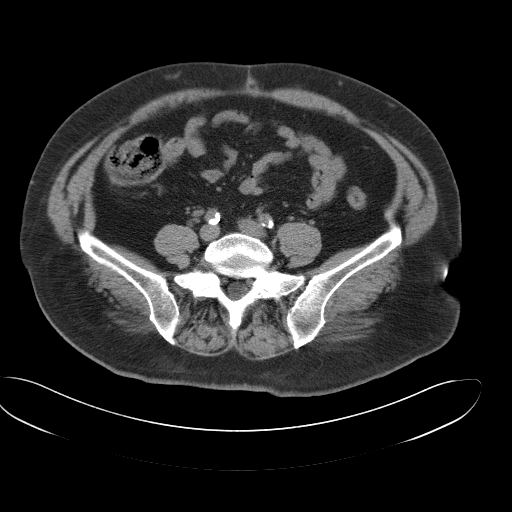
[im 55/103  soft-tissue]
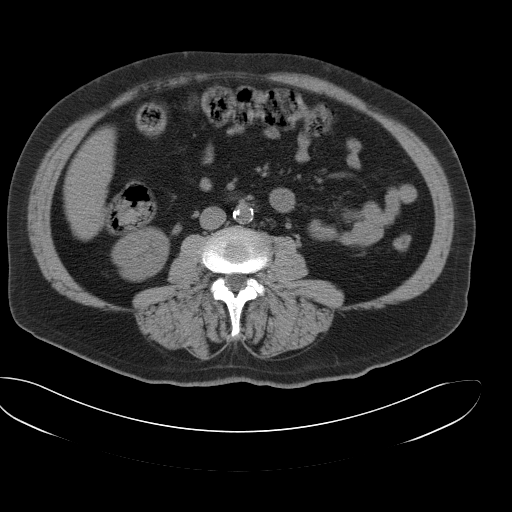
[im 62/103  soft-tissue]
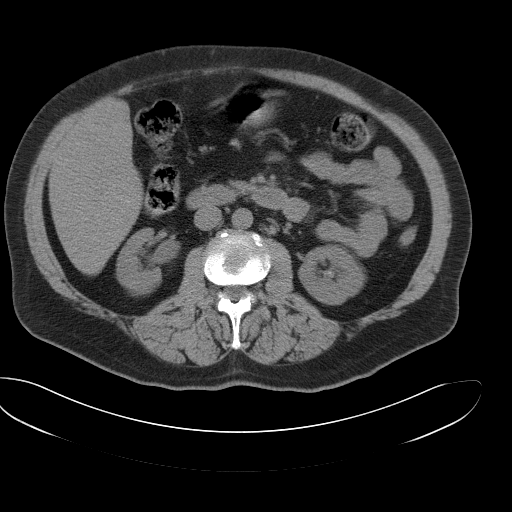
[im 69/103  soft-tissue]
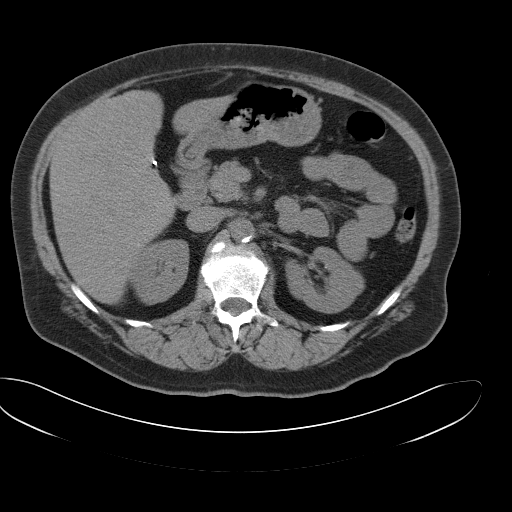
[im 69/103  bone]
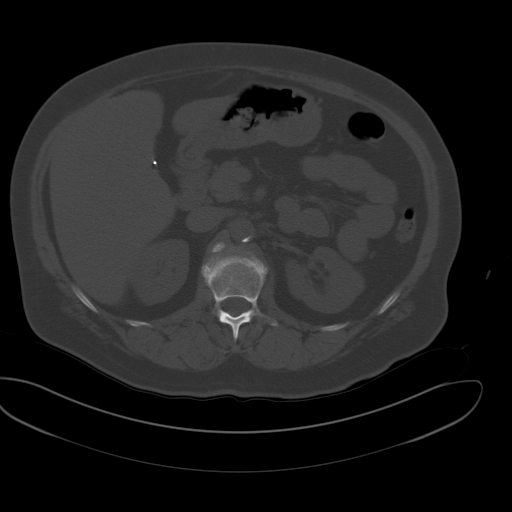
[im 75/103  soft-tissue]
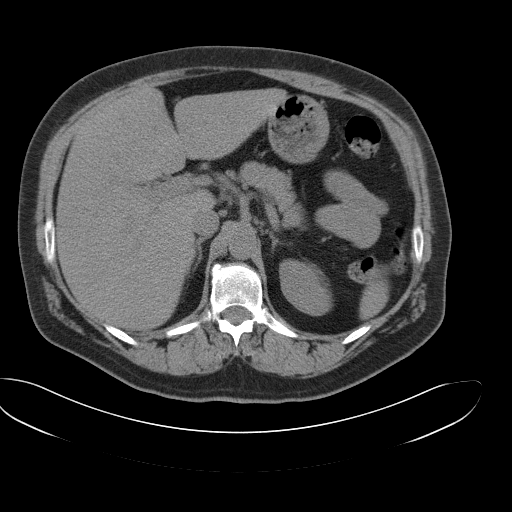
[im 75/103  lung]
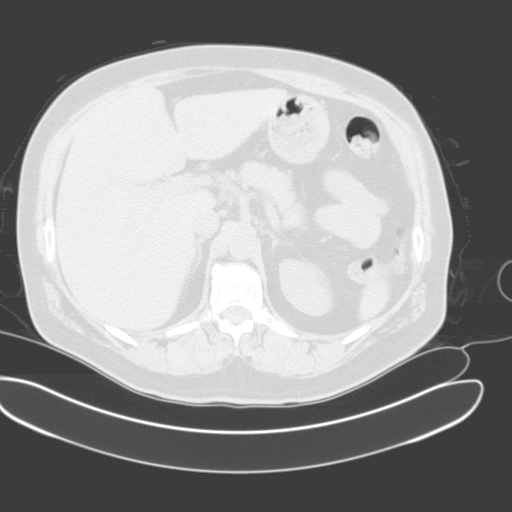
[im 82/103  soft-tissue]
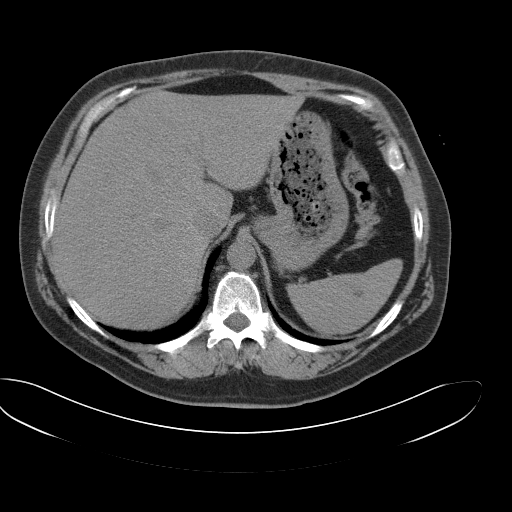
[im 82/103  lung]
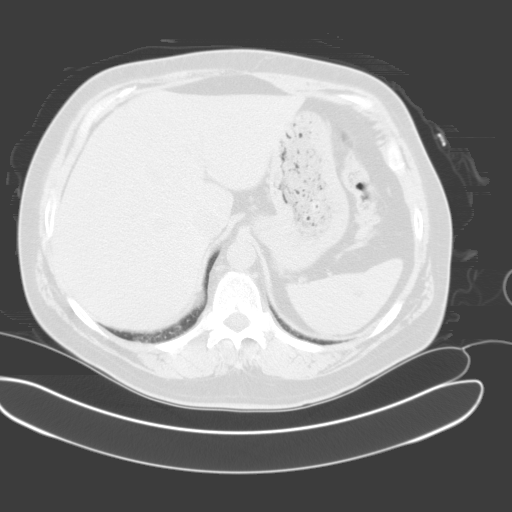
[im 89/103  soft-tissue]
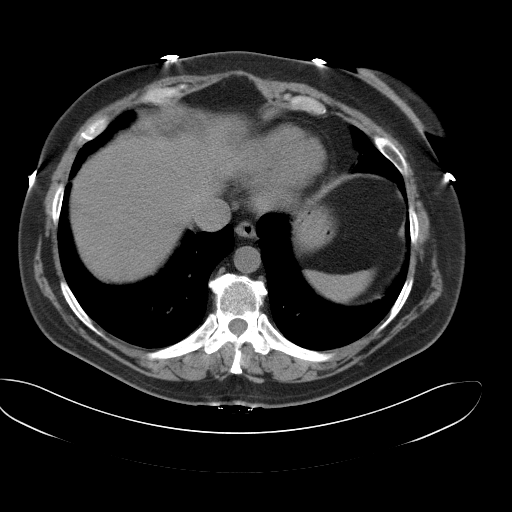
[im 89/103  lung]
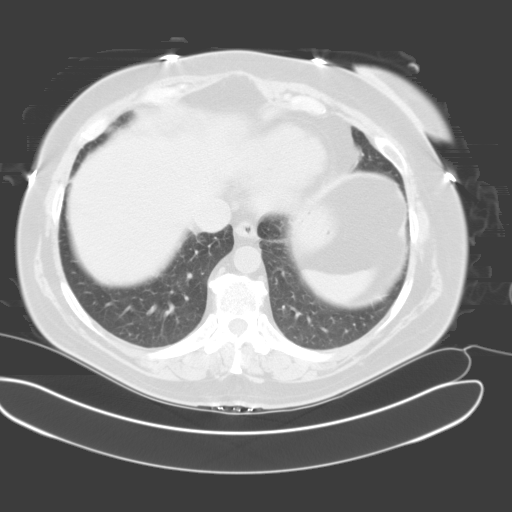
[im 96/103  soft-tissue]
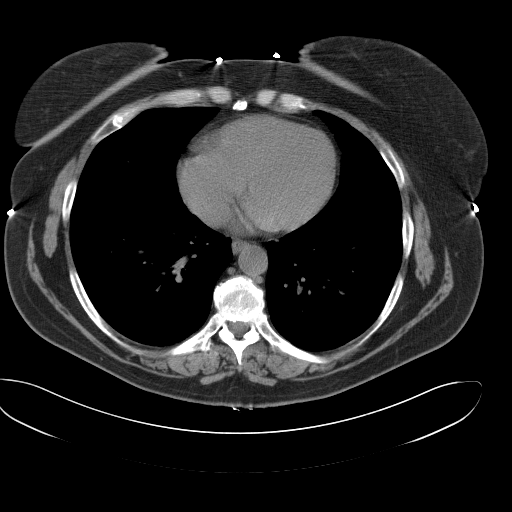
[im 96/103  lung]
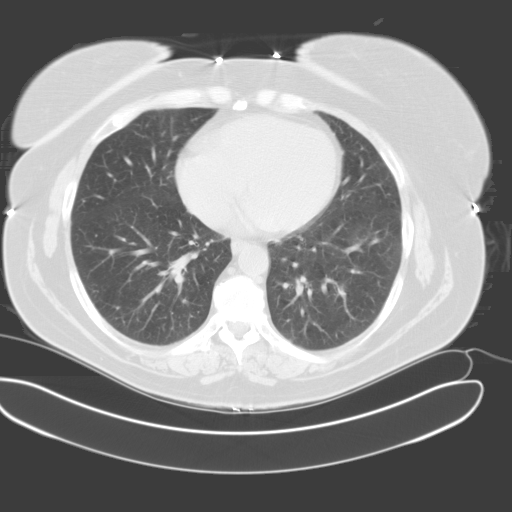

[13 of 32 positions shown; findings below may reference images not displayed]

Imaging in the lung bases demonstrates minimal atelectasis at the right lung base.  No focal nodule, mass or airspace disease is present. 
Heart size is normal.  No significant pleural or pericardial effusion is present.  
The uninfused appearance of the liver, spleen and pancreas is normal.  The patient is status post cholecystectomy.  The adrenal glands are within normal limits bilaterally.  A 3 mm calcification is redemonstrated in the lower pole of the left kidney.   degree of right sided hydroureteronephrosis is decreased.  Previously described 5 mm calcification is in near identical location in the midureter just beyond the point where the ureter crosses the iliac vessels.  Atherosclerotic calcifications are noted in the abdominal aorta and its major branch vessels without aneurysm.  
Bone windows reveal degenerative change without focal lytic or blastic lesion.
IMPRESSION: Stable appearance of 5 mm stone in the distal ureter just beyond the iliac crossing with slight decrease in hydroureteronephrosis.  Stable 3 mm calcification lower pole left kidney.  
PELVIS CT WITHOUT CONTRAST:
IMPRESSION: Negative pelvis.

## 2006-03-04 ENCOUNTER — Ambulatory Visit: Payer: Self-pay | Admitting: Family Medicine

## 2006-03-18 IMAGING — CR DG CHEST 2V
2 series · 2 of 2 positions shown · non-contrast
Comparison: none

CLINICAL DATA: Positive PPD.
 CHEST ? 2 VIEW:

[view not recorded (1 of 2)]
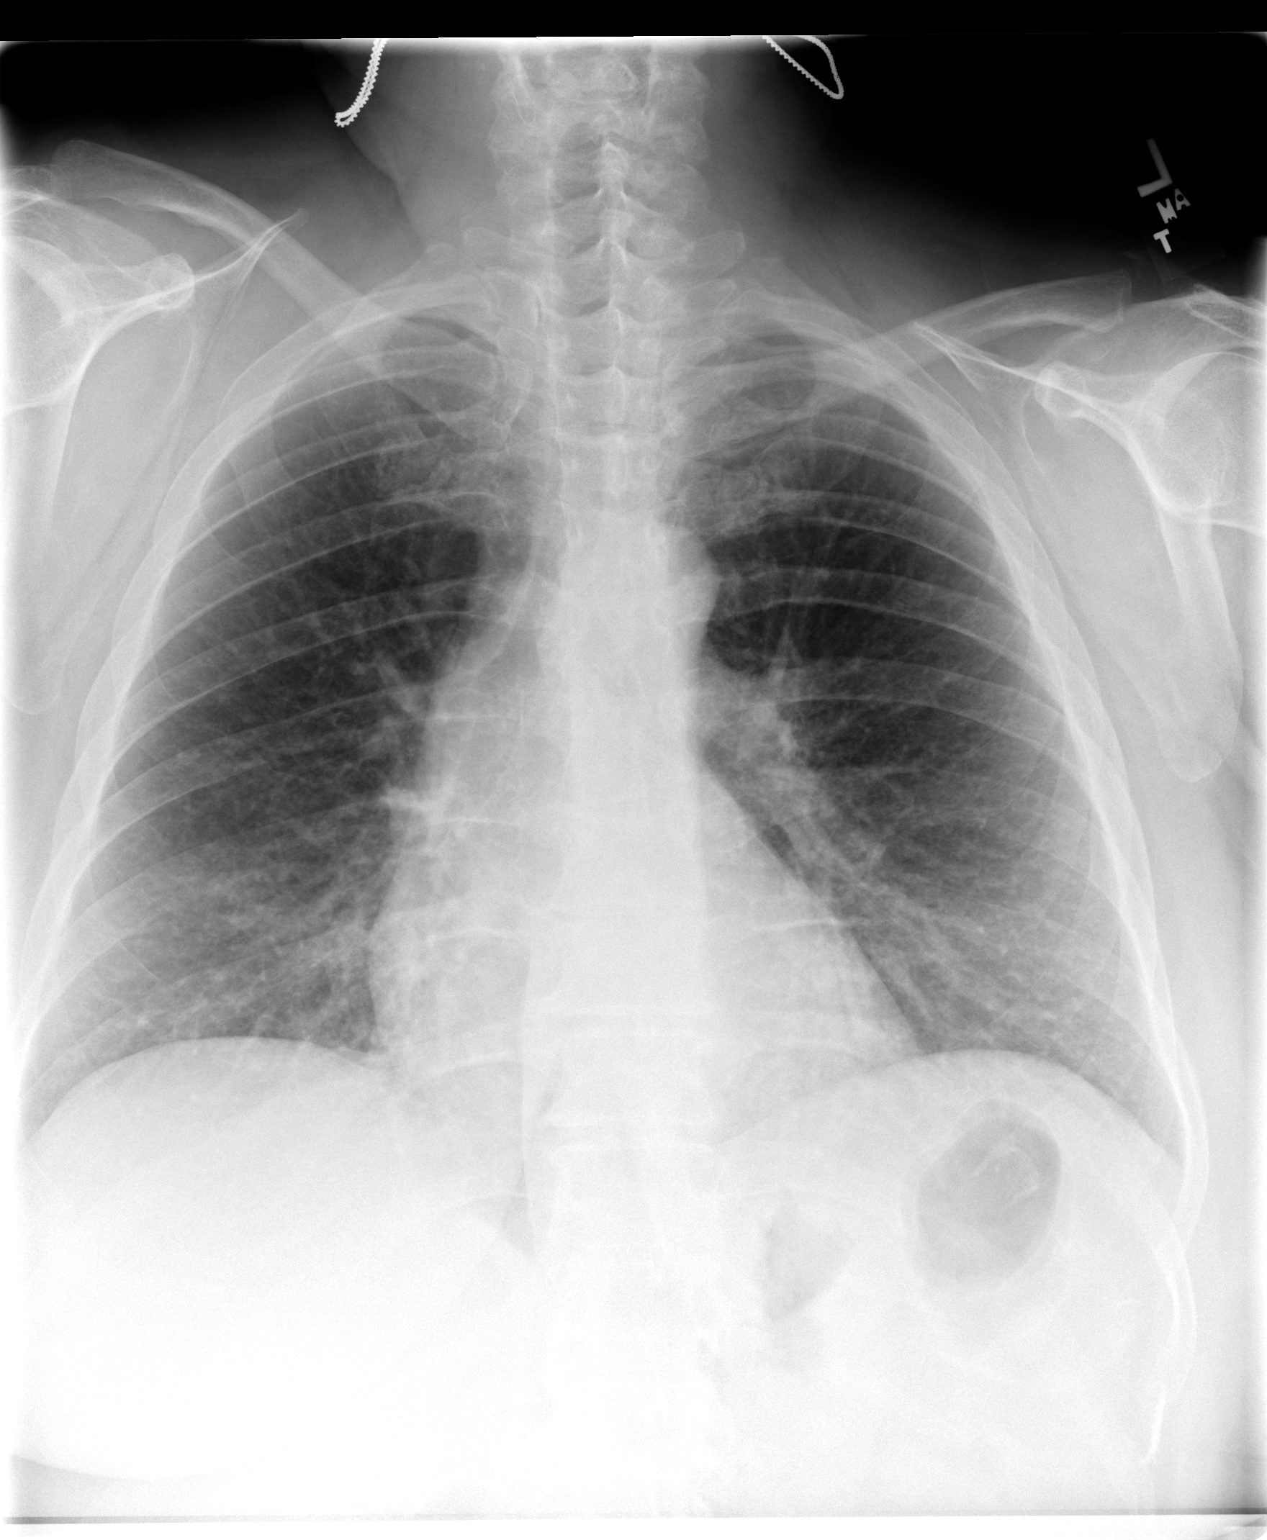

[view not recorded (2 of 2)]
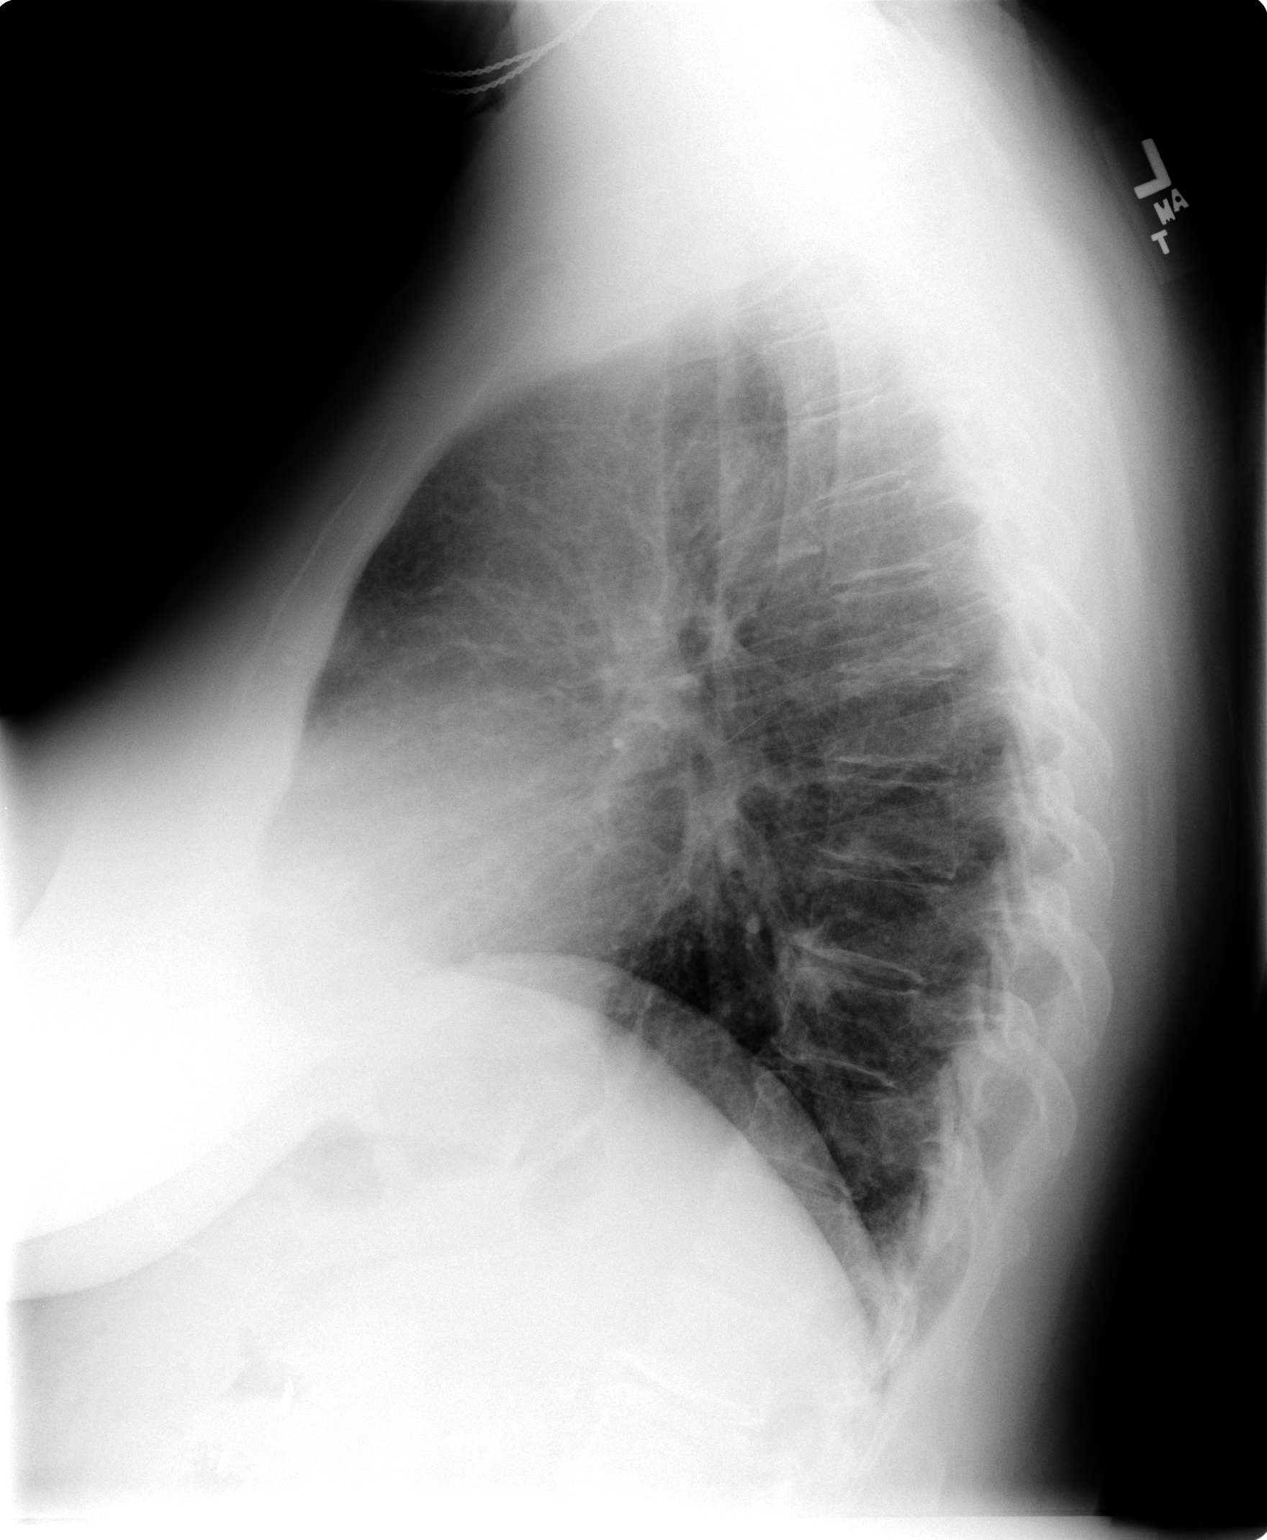

[2 of 2 positions shown; findings below may reference images not displayed]

FINDINGS: Prominent breast tissue causes increased density at the lung bases on the PA view.  No focal consolidation is seen.  Interstitial prominent is likely chronic.  No pneumothoraces or effusions are seen.  The heart is normal in size.  Bronchitic changes are also likely chronic.   Degenerative changes are present in the spine.
IMPRESSION: Chronic interstitial changes.   No acute cardiopulmonary disease.

## 2006-03-20 IMAGING — NM NM MYOCAR MULTI W/ SPECT
2 series · 12 of 12 positions shown · non-contrast
Comparison: none

CLINICAL DATA: 53-year-old woman with a history of chest pain. 
STRESS MYOVIEW STUDY:
RADIONUCLIDE DATA:  One day rest/stress protocol performed with 10.0/30.0 mCi RcPPm Myoview. 
STRESS DATA:  Treadmill exercise to a workload of 10 mets and a heart rate of 146, 87% of age - predicted maximum.  Exercise discontinued due to fatigue and dyspnea; no chest pain reported.  Blood pressure increased from a resting value of 110/60 to 180/60 at peak exercise, a normal response.  No significant arrhythmias - PVCs present during recovery. 
EKG:  Sinus bradycardia; slightly delayed R-wave progression.  
STRESS EKG:  Interpretation somewhat impaired by artifact.  Upsloping ST segment depression was present in the inferior leads, but an exact measurement was not feasible.  ST segment abnormalities reverted rapidly towards baseline in recovery. 
SCINTIGRAPHIC DATA:  Acquisition notable for moderate to marked breast attenuation.  The left ventricle was mildly dilated.  There was moderate bowel activity adjacent to the inferior myocardium.  On tomographic images reconstructed in standard planes, there was a small area of mildly decreased uptake in the mid inferolateral wall, which was adjacent to fairly intense extracardiac activity.  The rest images were unchanged.  The gated reconstruction demonstrated normal regional and global LV systolic function with an ejection fraction of .58.  There was normal systolic accentuation of activity throughout.

[Series 1: cs cardiac tc hi dose · 6.52mm/px · 6 of 512 frames shown]
[frame 43/512]
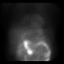
[frame 128/512]
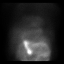
[frame 214/512]
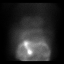
[frame 299/512]
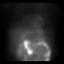
[frame 384/512]
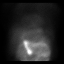
[frame 470/512]
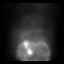

[Series 1: cr cardiac tc low dose · 6.52mm/px · 6 of 64 frames shown]
[frame 6/64]
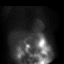
[frame 16/64]
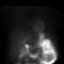
[frame 27/64]
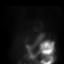
[frame 38/64]
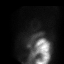
[frame 48/64]
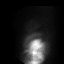
[frame 59/64]
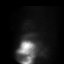

[12 of 12 positions shown; findings below may reference images not displayed]

IMPRESSION: Probably negative stress Myoview study revealing good exercise tolerance, no definite ischemic EKG abnormalities, mild left ventricular dilatation, and normal left ventricular systolic function.  By scintigraphic imaging, there was an inferolateral abnormality that may be artifactual and related to extracardiac adjacent activity.  A small degree of scarring in this region cannot be unequivocally excluded.  No evidence for myocardial ischemia.

## 2006-04-01 ENCOUNTER — Ambulatory Visit: Payer: Self-pay | Admitting: Family Medicine

## 2006-04-15 ENCOUNTER — Ambulatory Visit: Payer: Self-pay | Admitting: Family Medicine

## 2006-04-21 IMAGING — CT CT HEAD W/O CM
1 series · 16 of 30 positions shown, 20 images · IV contrast (agent unspecified)
Comparison: None.

CLINICAL DATA: Head injury.  Headaches. 
 HEAD CT WITHOUT CONTRAST:
TECHNIQUE: Contiguous axial images were obtained from the base of the skull through the vertex according to standard protocol without contrast.

[Series 3122: — · axial · 0.49mm/px · z∈[-669,-529]mm · 16 of 32 slices shown, 20 images]
[im 2/32  brain]
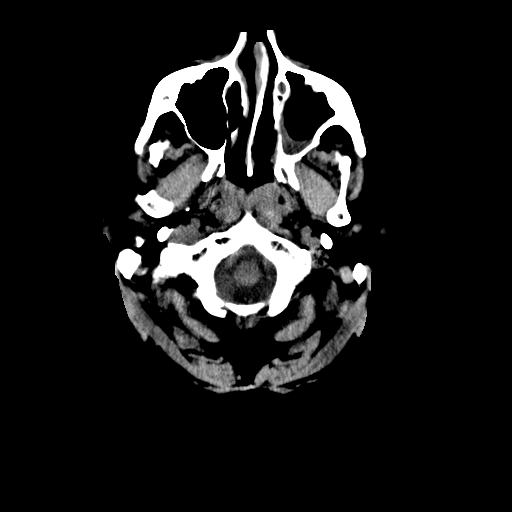
[im 2/32  bone]
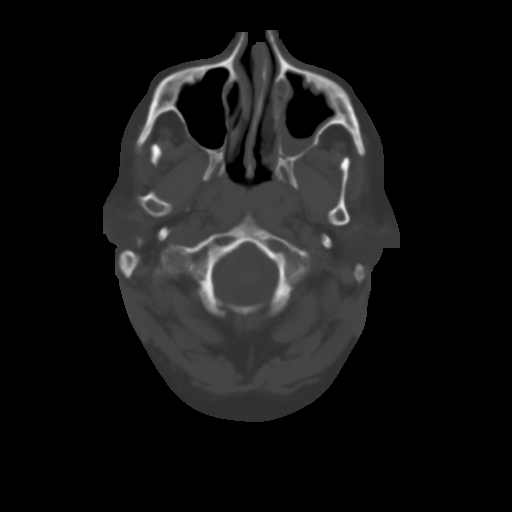
[im 4/32  brain]
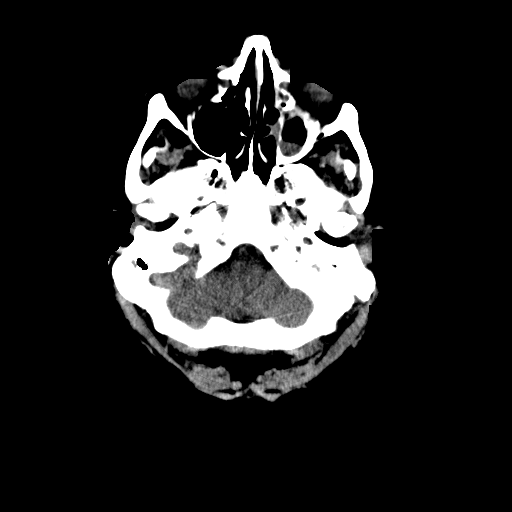
[im 6/32  brain]
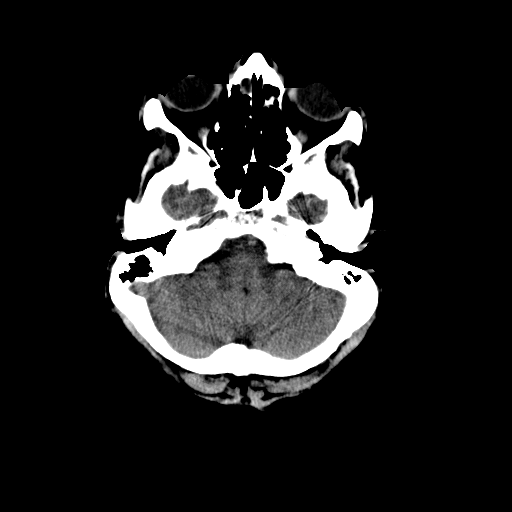
[im 8/32  brain]
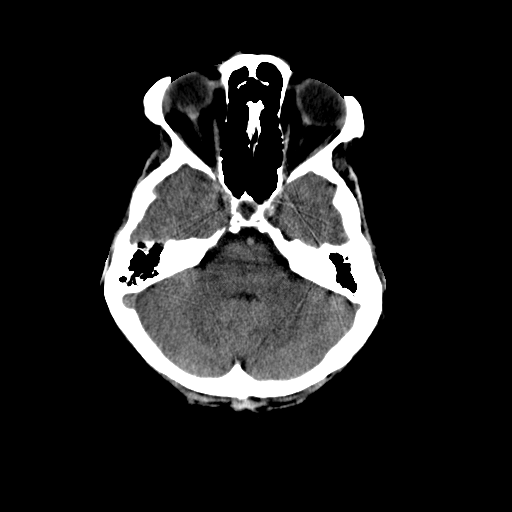
[im 9/32  brain]
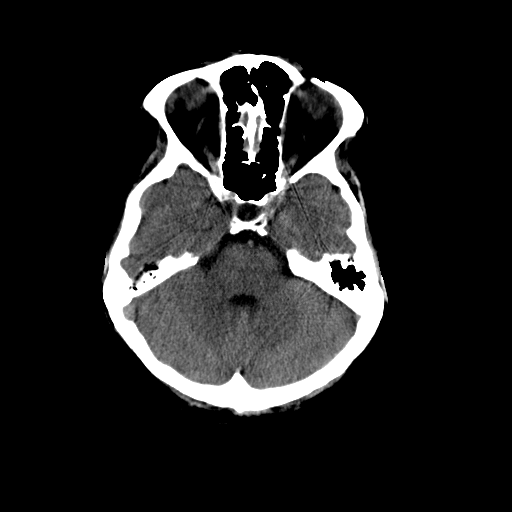
[im 9/32  bone]
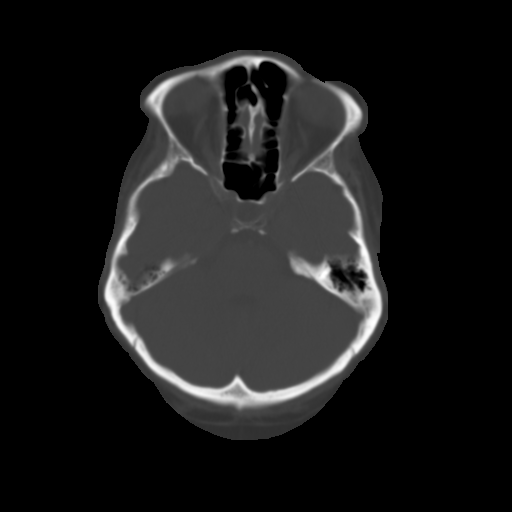
[im 11/32  brain]
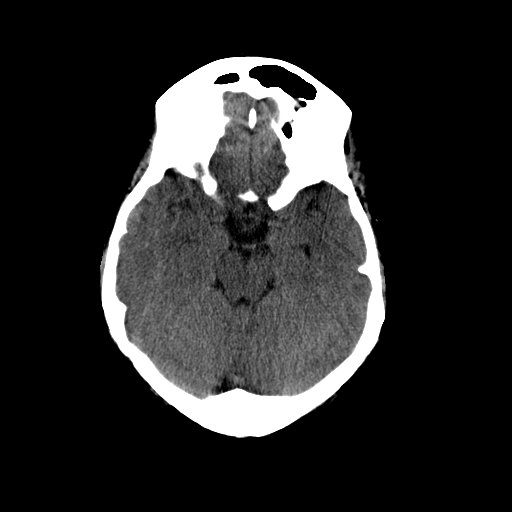
[im 13/32  brain]
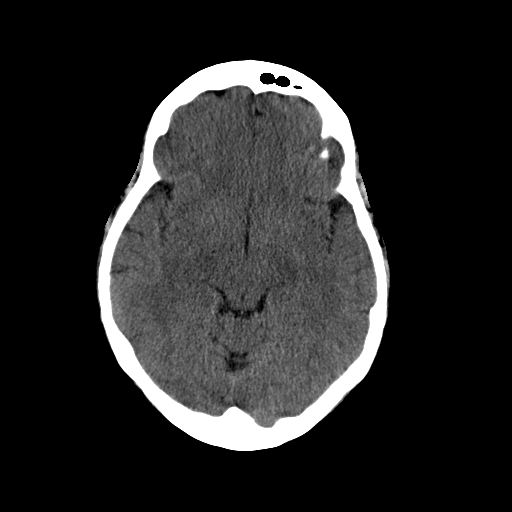
[im 15/32  brain]
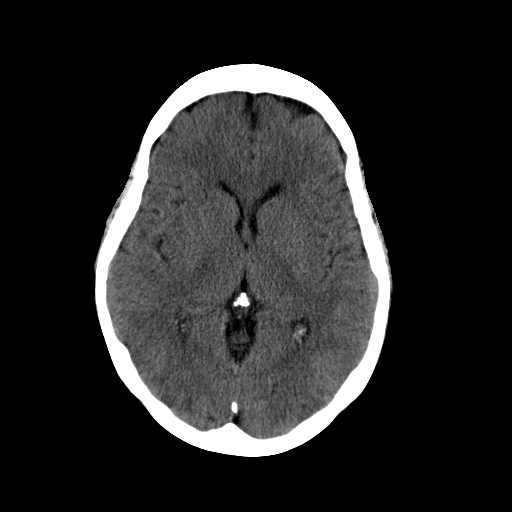
[im 17/32  brain]
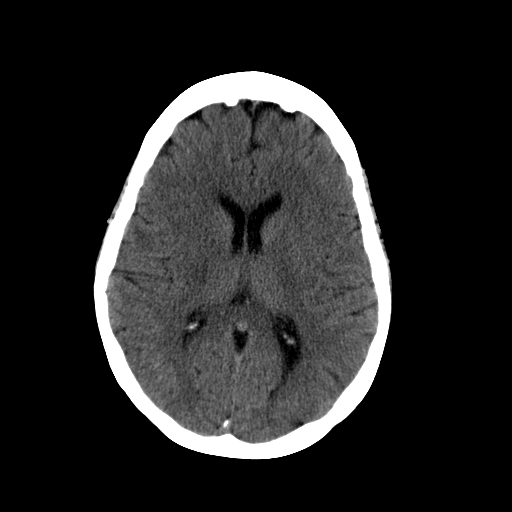
[im 17/32  bone]
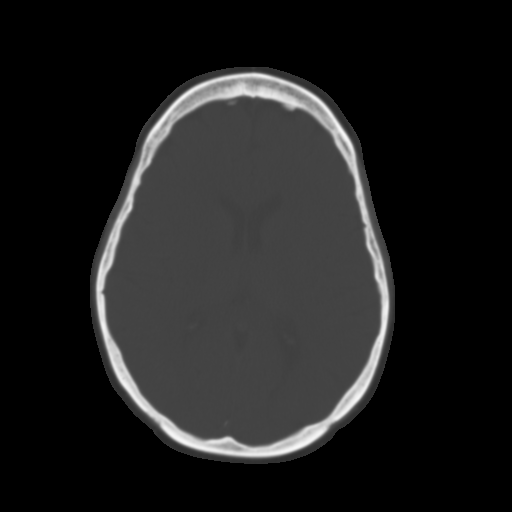
[im 19/32  brain]
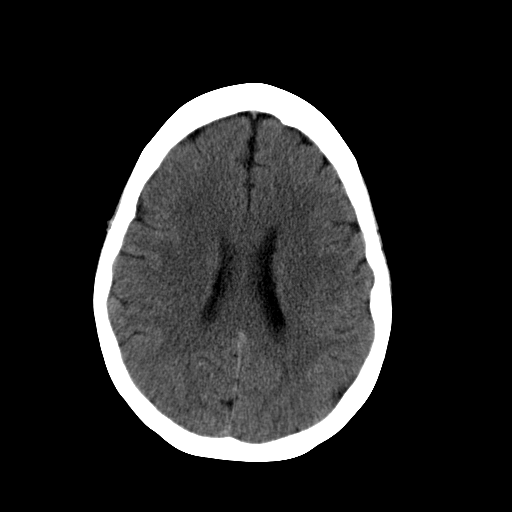
[im 21/32  brain]
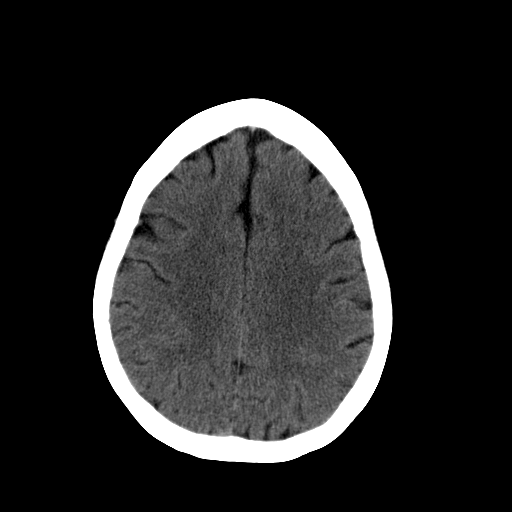
[im 23/32  brain]
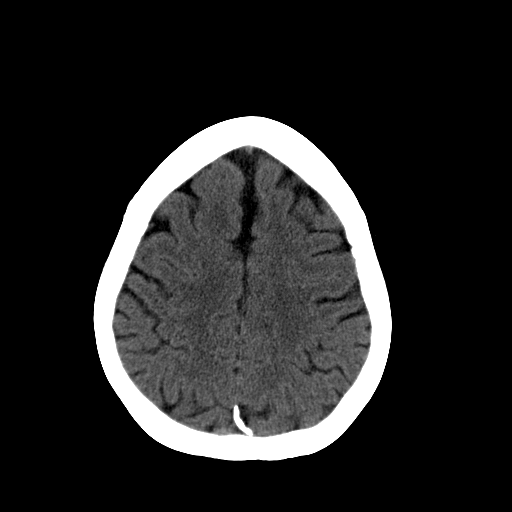
[im 24/32  brain]
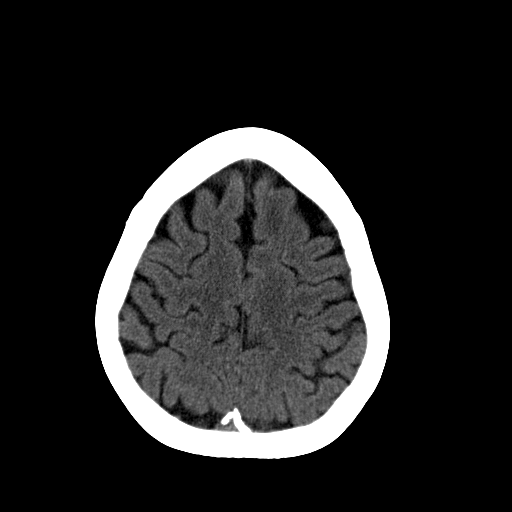
[im 24/32  bone]
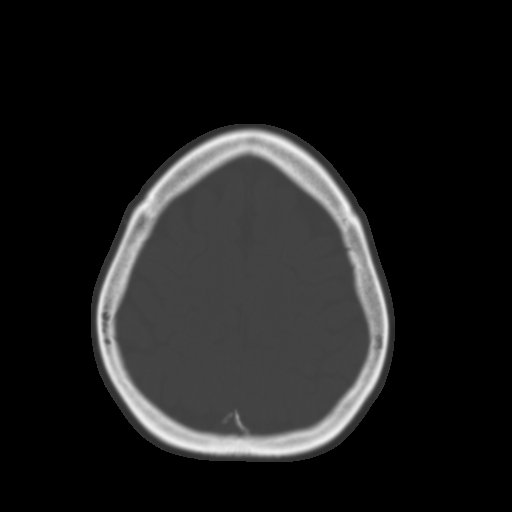
[im 26/32  brain]
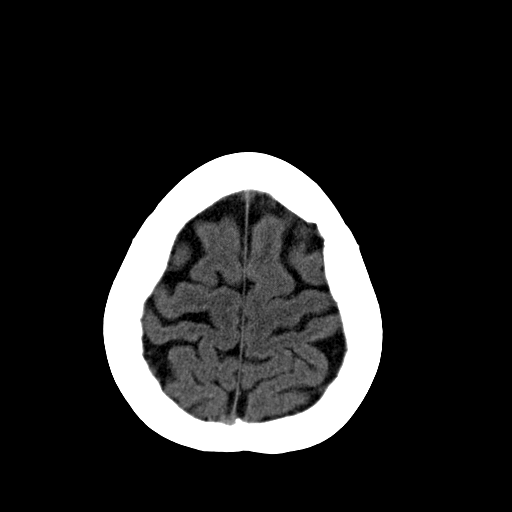
[im 28/32  brain]
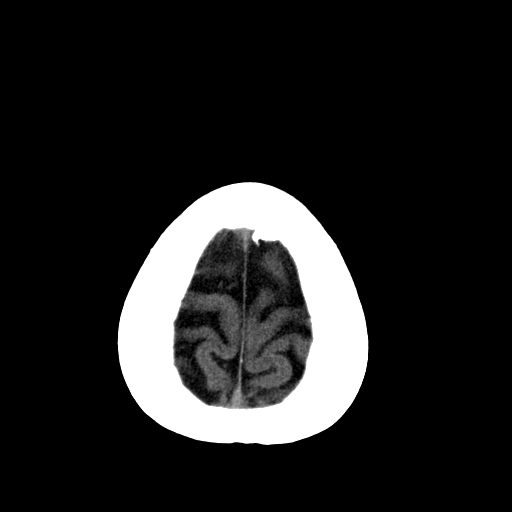
[im 30/32  brain]
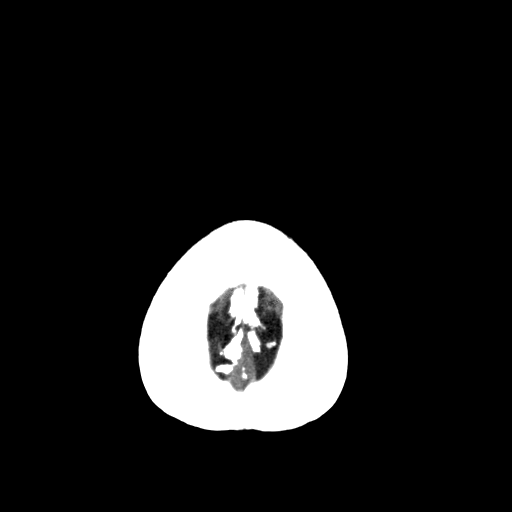

[16 of 30 positions shown; findings below may reference images not displayed]

FINDINGS: No evidence of acute infarct, hemorrhage, mass, mass effect, or hydrocephalus.  There is mucosal thickening and an air fluid level in the left maxillary sinus.
IMPRESSION: 1.  No acute intracranial abnormality.
 2.  Mucosal thickening with air fluid level in the left maxillary sinus.  Air fluid level is likely due to acute sinusitis but an occult fracture cannot be excluded.

## 2006-04-21 IMAGING — CR DG LUMBAR SPINE COMPLETE 4+V
5 series · 5 of 5 positions shown · non-contrast
Comparison: none

CLINICAL DATA: Injury two days ago with neck pain and lower back pain. 
 LUMBAR SPINE - 4 VIEW:

[view not recorded (1 of 5)]
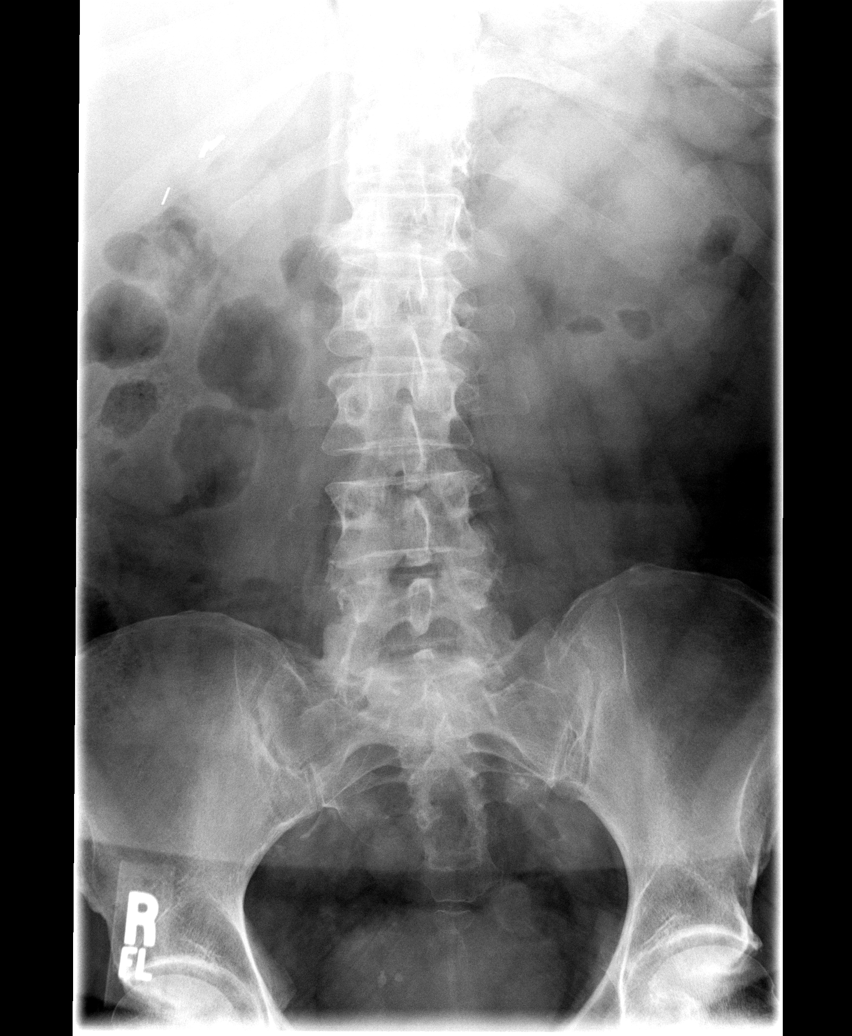

[view not recorded (2 of 5)]
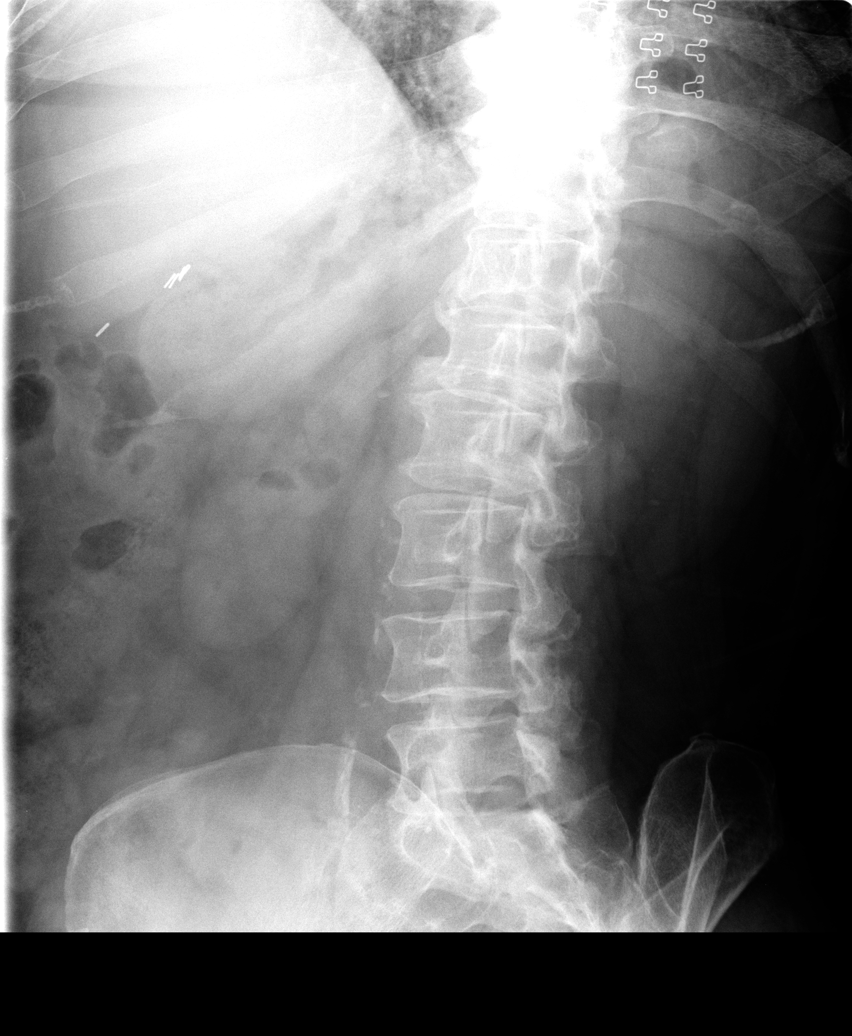

[view not recorded (3 of 5)]
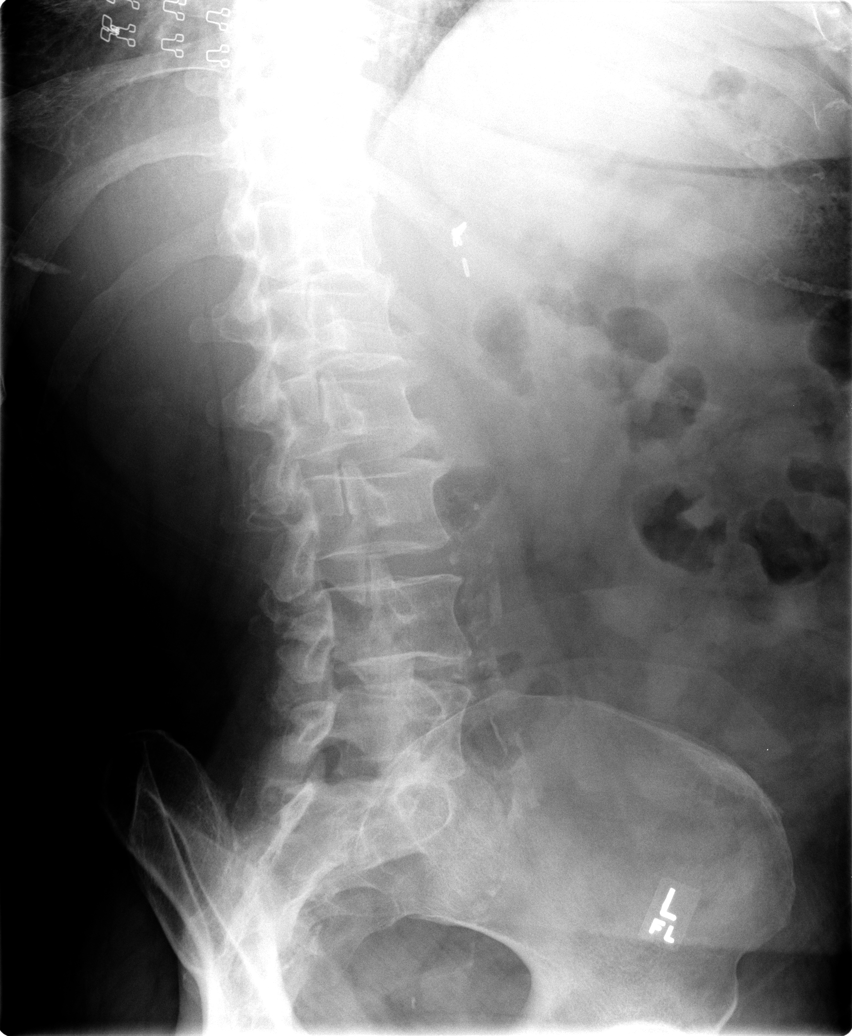

[view not recorded (4 of 5)]
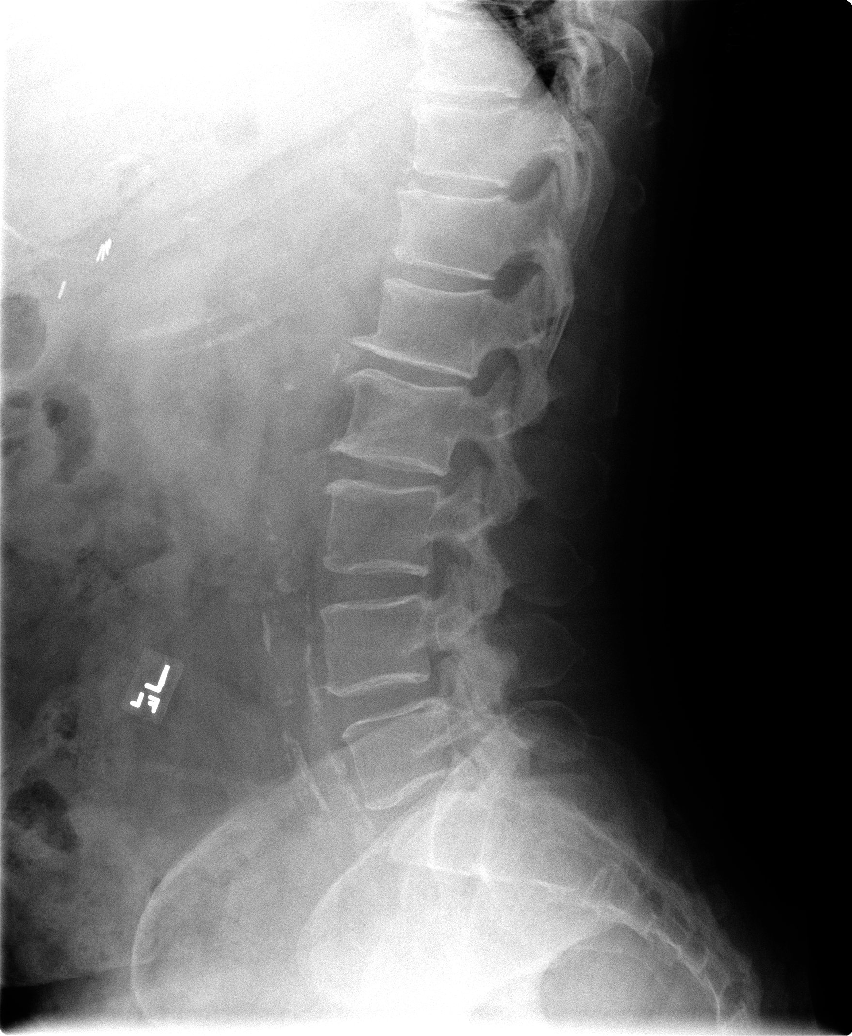

[view not recorded (5 of 5)]
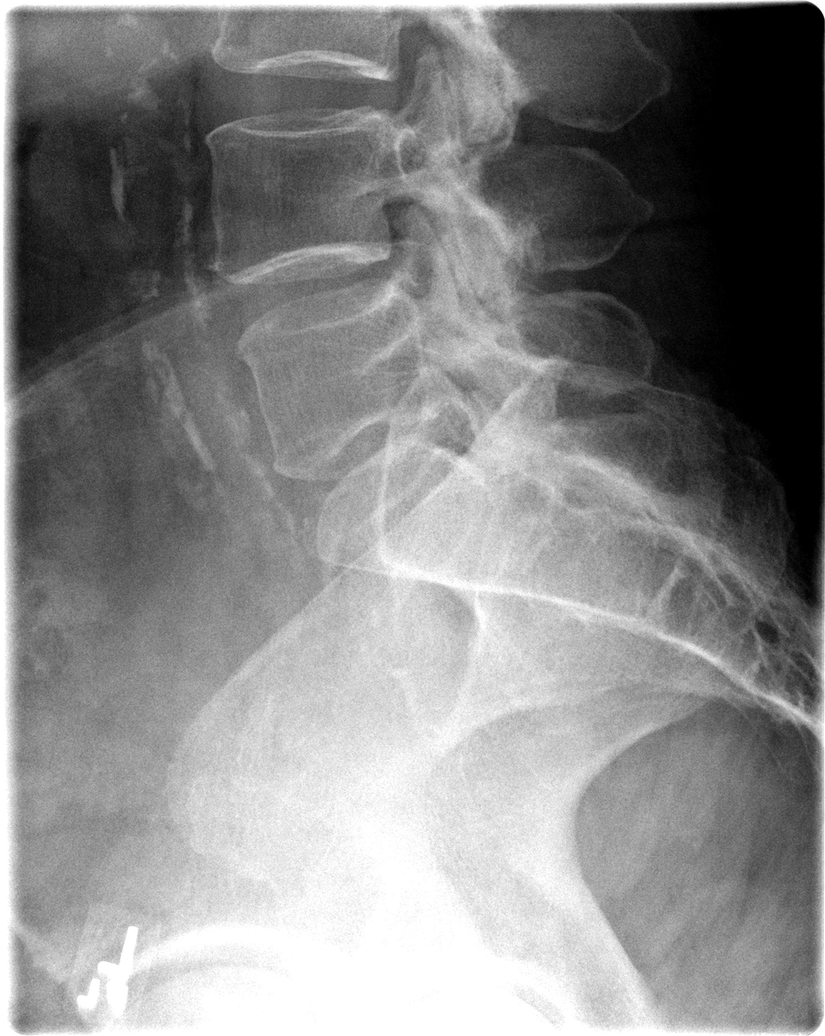

[5 of 5 positions shown; findings below may reference images not displayed]

FINDINGS: There is very mild rightward curvature of the lumbar spine.  The alignment is otherwise anatomic without spondylolisthesis.  Endplate degenerative changes, including sclerosis and marginal osteophytosis, are seen throughout the lumbar spine.  There is facet hypertrophy in the lower lumbar spine.  Atherosclerotic calcification of the arterial vasculature is noted.
IMPRESSION: Lumbar spondylosis. 
 CERVICAL SPINE - 6 VIEW:
FINDINGS: There is straightening of the normal cervical lordosis with loss of disk space height and osteophytosis at C5-6 and C6-7.  No neural foraminal narrowing.  Dens is partially obscured on the dedicated view.  There is motion on the swimmer's view.
IMPRESSION: 1.  No evidence of acute fracture or spondylolisthesis from the occiput to C6-7.  The C7-T1 junction is not well seen due to motion on the swimmer's view. 
 2.  Straightening of the normal cervical lordosis. 
 3.  Spondylosis.

## 2006-04-21 IMAGING — CR DG CERVICAL SPINE COMPLETE 4+V
7 series · 7 of 7 positions shown · non-contrast
Comparison: none

CLINICAL DATA: Injury two days ago with neck pain and lower back pain. 
 LUMBAR SPINE - 4 VIEW:

[view not recorded (1 of 7)]
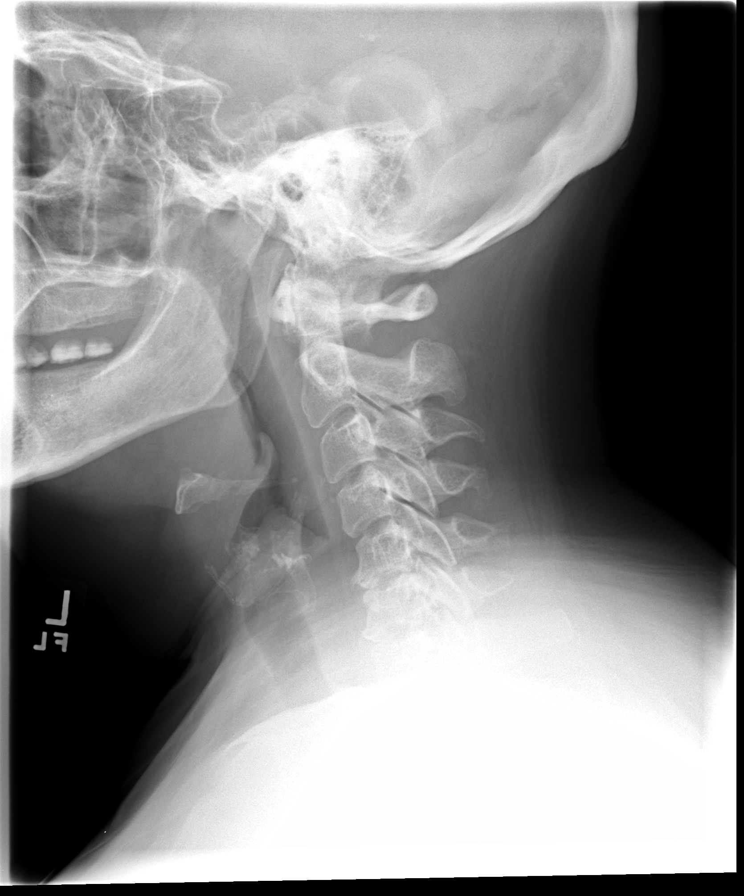

[view not recorded (2 of 7)]
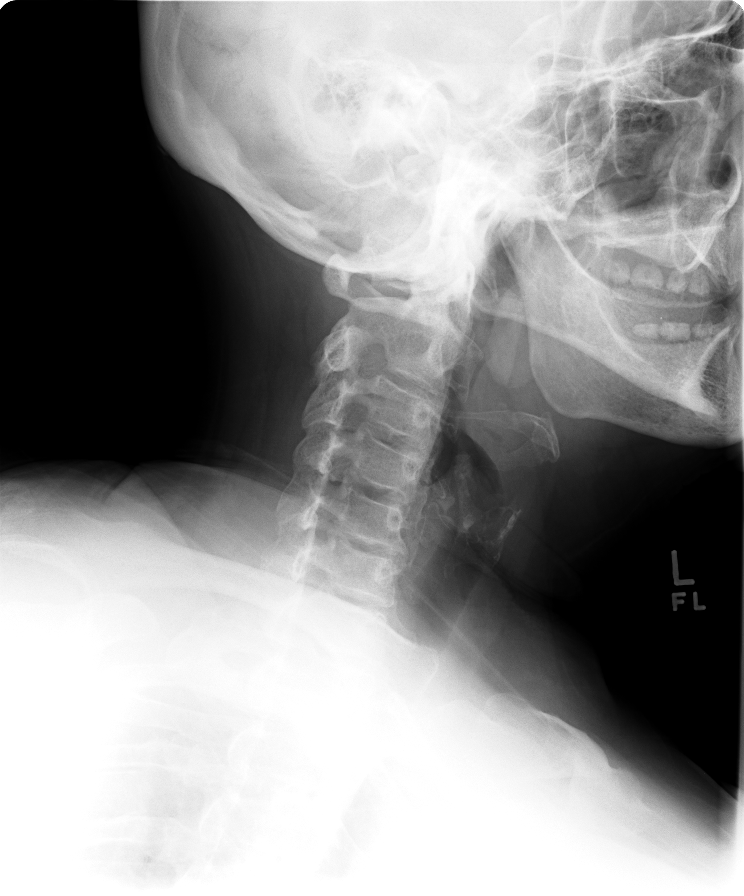

[view not recorded (3 of 7)]
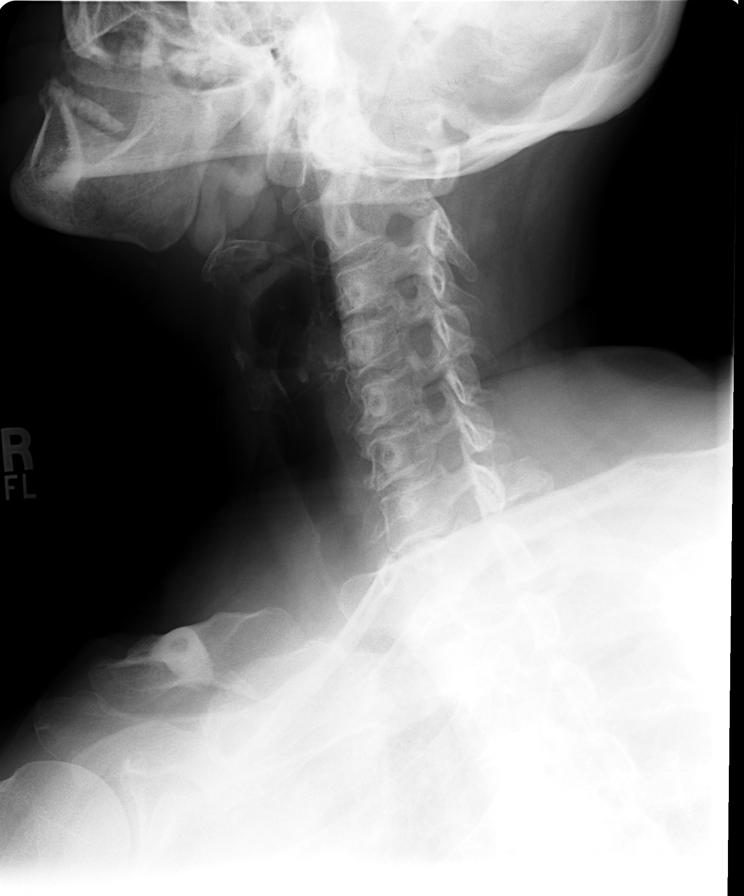

[view not recorded (4 of 7)]
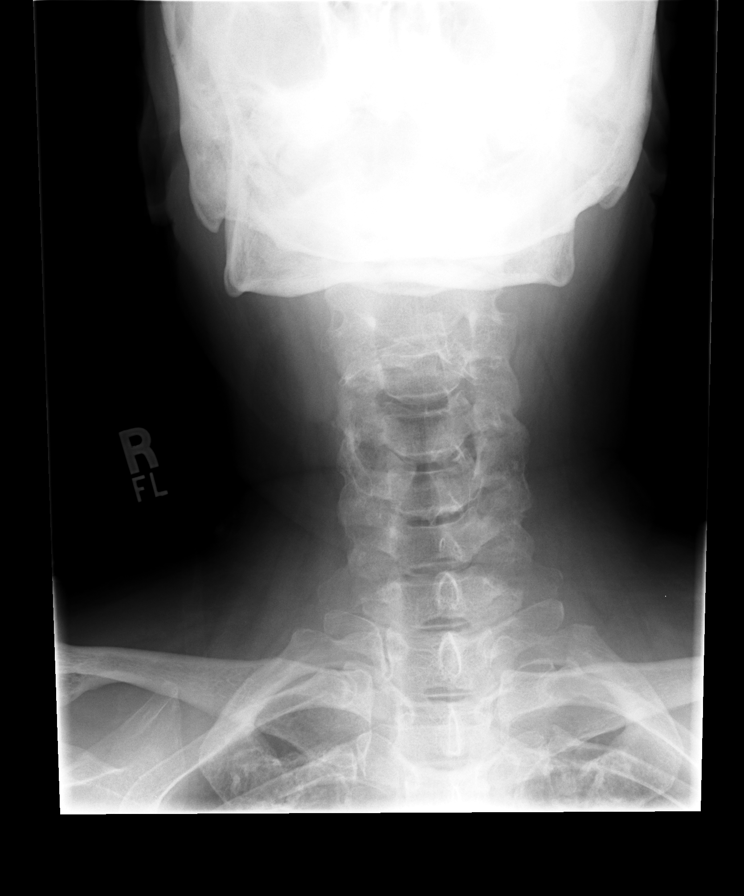

[view not recorded (5 of 7)]
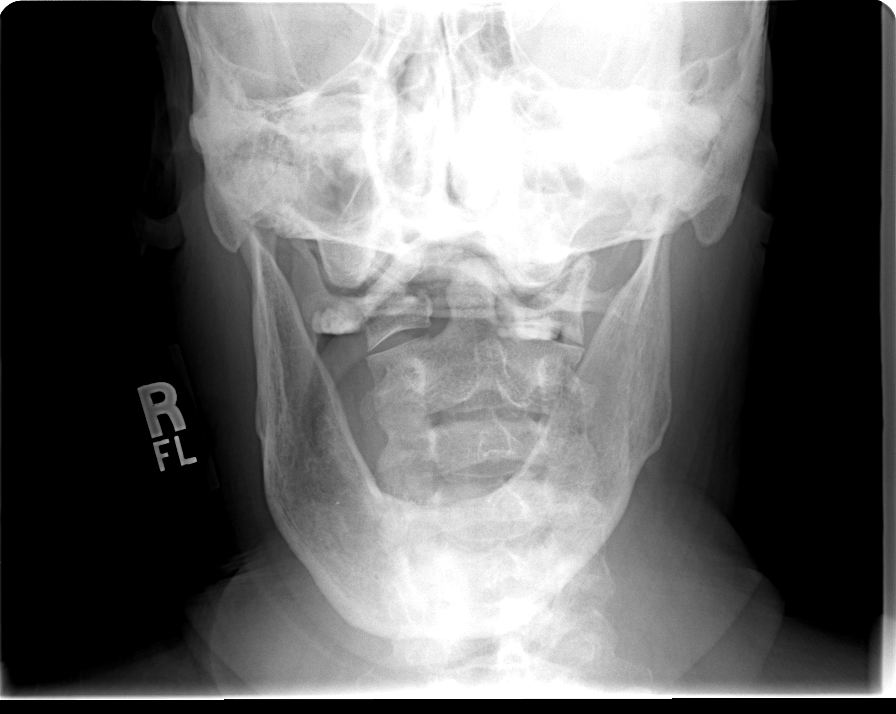

[view not recorded (6 of 7)]
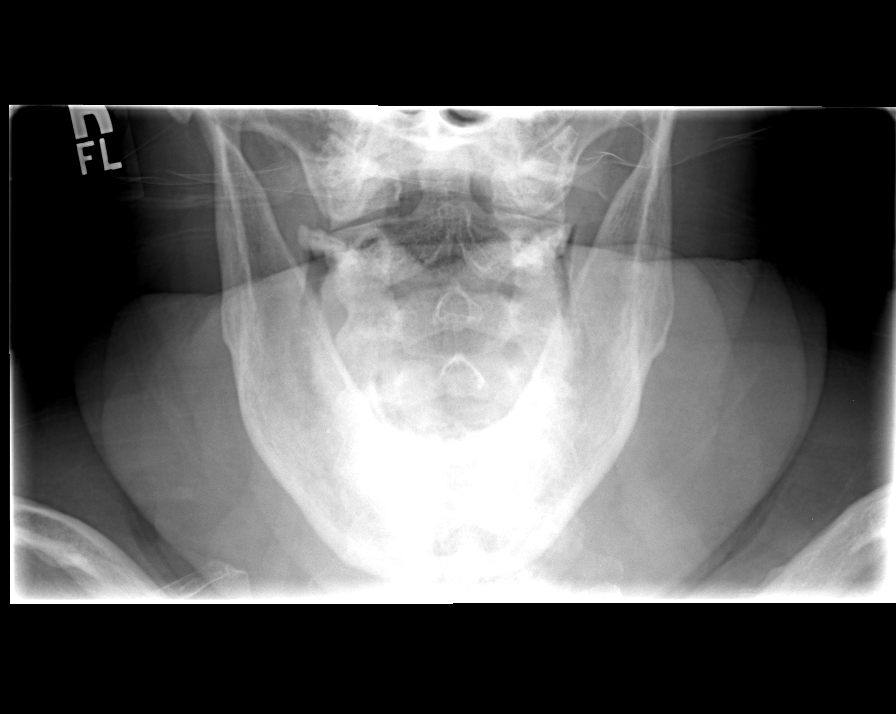

[view not recorded (7 of 7)]
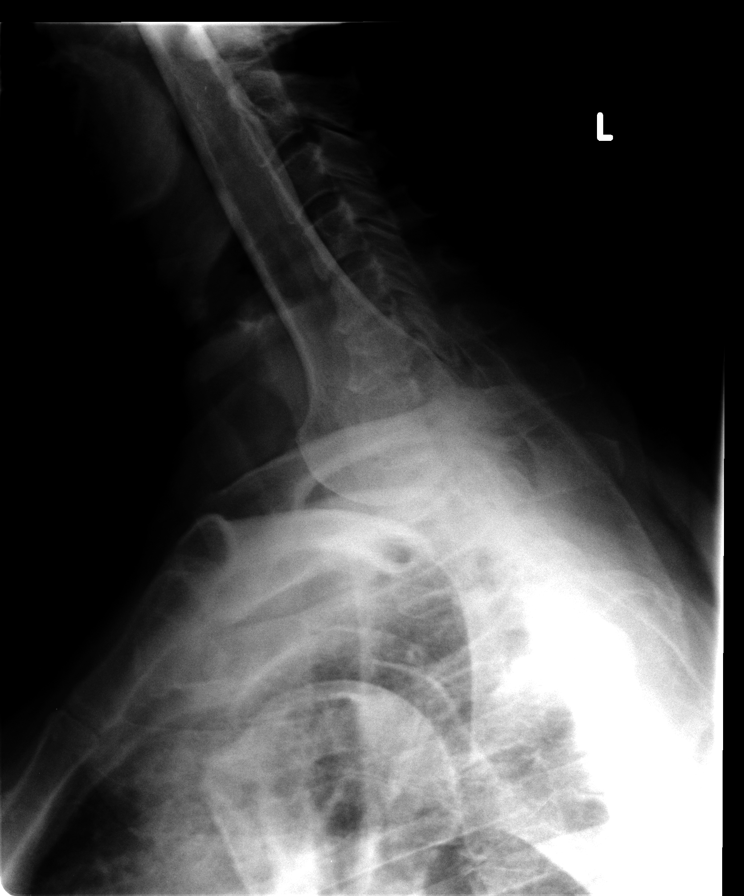

[7 of 7 positions shown; findings below may reference images not displayed]

FINDINGS: There is very mild rightward curvature of the lumbar spine.  The alignment is otherwise anatomic without spondylolisthesis.  Endplate degenerative changes, including sclerosis and marginal osteophytosis, are seen throughout the lumbar spine.  There is facet hypertrophy in the lower lumbar spine.  Atherosclerotic calcification of the arterial vasculature is noted.
IMPRESSION: Lumbar spondylosis. 
 CERVICAL SPINE - 6 VIEW:
FINDINGS: There is straightening of the normal cervical lordosis with loss of disk space height and osteophytosis at C5-6 and C6-7.  No neural foraminal narrowing.  Dens is partially obscured on the dedicated view.  There is motion on the swimmer's view.
IMPRESSION: 1.  No evidence of acute fracture or spondylolisthesis from the occiput to C6-7.  The C7-T1 junction is not well seen due to motion on the swimmer's view. 
 2.  Straightening of the normal cervical lordosis. 
 3.  Spondylosis.

## 2006-05-27 ENCOUNTER — Ambulatory Visit: Payer: Self-pay | Admitting: Family Medicine

## 2006-06-13 ENCOUNTER — Ambulatory Visit: Payer: Self-pay | Admitting: Family Medicine

## 2006-07-18 ENCOUNTER — Ambulatory Visit: Payer: Self-pay | Admitting: Family Medicine

## 2006-07-30 ENCOUNTER — Ambulatory Visit: Payer: Self-pay | Admitting: Internal Medicine

## 2006-08-12 ENCOUNTER — Ambulatory Visit: Payer: Self-pay | Admitting: Family Medicine

## 2006-08-26 ENCOUNTER — Ambulatory Visit: Payer: Self-pay | Admitting: Family Medicine

## 2006-09-16 ENCOUNTER — Ambulatory Visit: Payer: Self-pay | Admitting: Family Medicine

## 2006-10-01 ENCOUNTER — Ambulatory Visit: Payer: Self-pay | Admitting: Family Medicine

## 2006-10-15 HISTORY — PX: UMBILICAL HERNIA REPAIR: SUR1181

## 2006-10-16 ENCOUNTER — Ambulatory Visit: Payer: Self-pay | Admitting: Cardiology

## 2006-10-16 ENCOUNTER — Ambulatory Visit (HOSPITAL_COMMUNITY): Admission: RE | Admit: 2006-10-16 | Discharge: 2006-10-16 | Payer: Self-pay | Admitting: Family Medicine

## 2006-10-18 ENCOUNTER — Ambulatory Visit: Payer: Self-pay | Admitting: Cardiology

## 2006-10-18 ENCOUNTER — Ambulatory Visit (HOSPITAL_COMMUNITY): Admission: RE | Admit: 2006-10-18 | Discharge: 2006-10-18 | Payer: Self-pay | Admitting: Family Medicine

## 2006-10-23 ENCOUNTER — Ambulatory Visit: Payer: Self-pay | Admitting: Family Medicine

## 2006-10-29 ENCOUNTER — Ambulatory Visit: Payer: Self-pay | Admitting: Family Medicine

## 2006-11-01 ENCOUNTER — Encounter: Payer: Self-pay | Admitting: Family Medicine

## 2006-11-01 DIAGNOSIS — Z86718 Personal history of other venous thrombosis and embolism: Secondary | ICD-10-CM | POA: Insufficient documentation

## 2006-11-01 DIAGNOSIS — K219 Gastro-esophageal reflux disease without esophagitis: Secondary | ICD-10-CM | POA: Insufficient documentation

## 2006-11-01 DIAGNOSIS — M545 Low back pain: Secondary | ICD-10-CM

## 2006-11-01 DIAGNOSIS — J449 Chronic obstructive pulmonary disease, unspecified: Secondary | ICD-10-CM

## 2006-11-01 DIAGNOSIS — H269 Unspecified cataract: Secondary | ICD-10-CM | POA: Insufficient documentation

## 2006-11-01 DIAGNOSIS — E785 Hyperlipidemia, unspecified: Secondary | ICD-10-CM

## 2006-11-01 DIAGNOSIS — J441 Chronic obstructive pulmonary disease with (acute) exacerbation: Secondary | ICD-10-CM | POA: Insufficient documentation

## 2006-11-01 DIAGNOSIS — IMO0002 Reserved for concepts with insufficient information to code with codable children: Secondary | ICD-10-CM

## 2006-11-01 DIAGNOSIS — G609 Hereditary and idiopathic neuropathy, unspecified: Secondary | ICD-10-CM | POA: Insufficient documentation

## 2006-11-01 DIAGNOSIS — J309 Allergic rhinitis, unspecified: Secondary | ICD-10-CM | POA: Insufficient documentation

## 2006-11-01 DIAGNOSIS — E039 Hypothyroidism, unspecified: Secondary | ICD-10-CM | POA: Insufficient documentation

## 2006-11-01 DIAGNOSIS — F329 Major depressive disorder, single episode, unspecified: Secondary | ICD-10-CM

## 2006-11-01 HISTORY — DX: Personal history of other venous thrombosis and embolism: Z86.718

## 2006-11-01 HISTORY — DX: Unspecified cataract: H26.9

## 2006-11-18 ENCOUNTER — Ambulatory Visit: Payer: Self-pay | Admitting: Internal Medicine

## 2006-11-26 ENCOUNTER — Ambulatory Visit: Payer: Self-pay | Admitting: Family Medicine

## 2006-11-26 DIAGNOSIS — F411 Generalized anxiety disorder: Secondary | ICD-10-CM | POA: Insufficient documentation

## 2006-11-26 LAB — CONVERTED CEMR LAB: Inflenza A Ag: NEGATIVE

## 2006-12-10 ENCOUNTER — Ambulatory Visit: Payer: Self-pay | Admitting: Family Medicine

## 2006-12-12 ENCOUNTER — Encounter (INDEPENDENT_AMBULATORY_CARE_PROVIDER_SITE_OTHER): Payer: Self-pay | Admitting: Family Medicine

## 2006-12-12 ENCOUNTER — Telehealth (INDEPENDENT_AMBULATORY_CARE_PROVIDER_SITE_OTHER): Payer: Self-pay | Admitting: Family Medicine

## 2007-01-01 ENCOUNTER — Encounter (INDEPENDENT_AMBULATORY_CARE_PROVIDER_SITE_OTHER): Payer: Self-pay | Admitting: Family Medicine

## 2007-01-02 ENCOUNTER — Encounter (INDEPENDENT_AMBULATORY_CARE_PROVIDER_SITE_OTHER): Payer: Self-pay | Admitting: Family Medicine

## 2007-01-05 LAB — CONVERTED CEMR LAB
ALT: 22 units/L (ref 0–35)
AST: 17 units/L (ref 0–37)
Albumin: 4.6 g/dL (ref 3.5–5.2)
BUN: 17 mg/dL (ref 6–23)
Basophils Relative: 0 % (ref 0–1)
Chloride: 103 meq/L (ref 96–112)
Cholesterol: 176 mg/dL (ref 0–200)
Creatinine, Ser: 0.86 mg/dL (ref 0.40–1.20)
Eosinophils Absolute: 0.6 10*3/uL (ref 0.0–0.7)
Eosinophils Relative: 7 % — ABNORMAL HIGH (ref 0–5)
Glucose, Bld: 147 mg/dL — ABNORMAL HIGH (ref 70–99)
HDL: 41 mg/dL (ref >39)
Hemoglobin: 14.3 g/dL (ref 12.0–15.0)
Lymphs Abs: 3 10*3/uL (ref 0.7–3.3)
MCHC: 32 g/dL (ref 30.0–36.0)
Microalb, Ur: 1.23 mg/dL (ref 0.00–1.89)
Potassium: 4.6 meq/L (ref 3.5–5.3)
Total Bilirubin: 0.4 mg/dL (ref 0.3–1.2)
Total CHOL/HDL Ratio: 4.3
Total Protein: 7.6 g/dL (ref 6.0–8.3)
Triglycerides: 297 mg/dL — ABNORMAL HIGH (ref <150)
VLDL: 59 mg/dL — ABNORMAL HIGH (ref 0–40)

## 2007-01-07 ENCOUNTER — Ambulatory Visit: Payer: Self-pay | Admitting: Family Medicine

## 2007-01-07 LAB — CONVERTED CEMR LAB
Cholesterol, target level: 200 mg/dL
Glucose, Bld: 226 mg/dL
HDL goal, serum: 40 mg/dL
LDL Goal: 70 mg/dL

## 2007-01-08 ENCOUNTER — Telehealth (INDEPENDENT_AMBULATORY_CARE_PROVIDER_SITE_OTHER): Payer: Self-pay | Admitting: Family Medicine

## 2007-01-10 ENCOUNTER — Ambulatory Visit (HOSPITAL_COMMUNITY): Admission: RE | Admit: 2007-01-10 | Discharge: 2007-01-10 | Payer: Self-pay | Admitting: Family Medicine

## 2007-01-30 ENCOUNTER — Encounter (INDEPENDENT_AMBULATORY_CARE_PROVIDER_SITE_OTHER): Payer: Self-pay | Admitting: Family Medicine

## 2007-02-04 ENCOUNTER — Ambulatory Visit: Payer: Self-pay | Admitting: Family Medicine

## 2007-02-04 DIAGNOSIS — N200 Calculus of kidney: Secondary | ICD-10-CM

## 2007-02-04 HISTORY — DX: Calculus of kidney: N20.0

## 2007-02-06 ENCOUNTER — Telehealth (INDEPENDENT_AMBULATORY_CARE_PROVIDER_SITE_OTHER): Payer: Self-pay | Admitting: Family Medicine

## 2007-02-06 ENCOUNTER — Encounter (INDEPENDENT_AMBULATORY_CARE_PROVIDER_SITE_OTHER): Payer: Self-pay | Admitting: Family Medicine

## 2007-02-07 ENCOUNTER — Encounter (INDEPENDENT_AMBULATORY_CARE_PROVIDER_SITE_OTHER): Payer: Self-pay | Admitting: Family Medicine

## 2007-02-07 LAB — CONVERTED CEMR LAB: TSH: 9.715 microintl units/mL — ABNORMAL HIGH (ref 0.350–5.50)

## 2007-02-10 ENCOUNTER — Telehealth (INDEPENDENT_AMBULATORY_CARE_PROVIDER_SITE_OTHER): Payer: Self-pay | Admitting: Family Medicine

## 2007-02-24 ENCOUNTER — Encounter (INDEPENDENT_AMBULATORY_CARE_PROVIDER_SITE_OTHER): Payer: Self-pay | Admitting: Family Medicine

## 2007-02-25 ENCOUNTER — Encounter (INDEPENDENT_AMBULATORY_CARE_PROVIDER_SITE_OTHER): Payer: Self-pay | Admitting: Family Medicine

## 2007-03-07 ENCOUNTER — Encounter (INDEPENDENT_AMBULATORY_CARE_PROVIDER_SITE_OTHER): Payer: Self-pay | Admitting: Family Medicine

## 2007-03-14 ENCOUNTER — Telehealth (INDEPENDENT_AMBULATORY_CARE_PROVIDER_SITE_OTHER): Payer: Self-pay | Admitting: *Deleted

## 2007-03-20 ENCOUNTER — Ambulatory Visit: Payer: Self-pay | Admitting: Family Medicine

## 2007-04-03 ENCOUNTER — Encounter (INDEPENDENT_AMBULATORY_CARE_PROVIDER_SITE_OTHER): Payer: Self-pay | Admitting: Family Medicine

## 2007-04-16 ENCOUNTER — Encounter (INDEPENDENT_AMBULATORY_CARE_PROVIDER_SITE_OTHER): Payer: Self-pay | Admitting: Family Medicine

## 2007-04-21 ENCOUNTER — Telehealth (INDEPENDENT_AMBULATORY_CARE_PROVIDER_SITE_OTHER): Payer: Self-pay | Admitting: Family Medicine

## 2007-04-21 ENCOUNTER — Ambulatory Visit: Payer: Self-pay | Admitting: Family Medicine

## 2007-04-23 ENCOUNTER — Telehealth (INDEPENDENT_AMBULATORY_CARE_PROVIDER_SITE_OTHER): Payer: Self-pay | Admitting: *Deleted

## 2007-04-24 ENCOUNTER — Ambulatory Visit: Payer: Self-pay | Admitting: Family Medicine

## 2007-04-29 ENCOUNTER — Encounter: Admission: RE | Admit: 2007-04-29 | Discharge: 2007-04-29 | Payer: Self-pay | Admitting: Family Medicine

## 2007-04-30 ENCOUNTER — Telehealth (INDEPENDENT_AMBULATORY_CARE_PROVIDER_SITE_OTHER): Payer: Self-pay | Admitting: Family Medicine

## 2007-05-19 ENCOUNTER — Encounter (INDEPENDENT_AMBULATORY_CARE_PROVIDER_SITE_OTHER): Payer: Self-pay | Admitting: Family Medicine

## 2007-05-20 ENCOUNTER — Ambulatory Visit: Payer: Self-pay | Admitting: Family Medicine

## 2007-06-03 ENCOUNTER — Ambulatory Visit: Payer: Self-pay | Admitting: Family Medicine

## 2007-06-03 ENCOUNTER — Telehealth (INDEPENDENT_AMBULATORY_CARE_PROVIDER_SITE_OTHER): Payer: Self-pay | Admitting: *Deleted

## 2007-06-03 DIAGNOSIS — K589 Irritable bowel syndrome without diarrhea: Secondary | ICD-10-CM | POA: Insufficient documentation

## 2007-06-03 DIAGNOSIS — F17200 Nicotine dependence, unspecified, uncomplicated: Secondary | ICD-10-CM | POA: Insufficient documentation

## 2007-06-03 LAB — CONVERTED CEMR LAB: Hgb A1c MFr Bld: 6.7 %

## 2007-06-04 ENCOUNTER — Encounter (INDEPENDENT_AMBULATORY_CARE_PROVIDER_SITE_OTHER): Payer: Self-pay | Admitting: Family Medicine

## 2007-06-09 ENCOUNTER — Ambulatory Visit: Payer: Self-pay | Admitting: Family Medicine

## 2007-06-09 ENCOUNTER — Ambulatory Visit: Payer: Self-pay | Admitting: Internal Medicine

## 2007-06-10 ENCOUNTER — Encounter (INDEPENDENT_AMBULATORY_CARE_PROVIDER_SITE_OTHER): Payer: Self-pay | Admitting: Family Medicine

## 2007-06-11 ENCOUNTER — Telehealth (INDEPENDENT_AMBULATORY_CARE_PROVIDER_SITE_OTHER): Payer: Self-pay | Admitting: *Deleted

## 2007-06-11 LAB — CONVERTED CEMR LAB
Alkaline Phosphatase: 77 units/L (ref 39–117)
Basophils Relative: 0 % (ref 0–1)
CO2: 27 meq/L (ref 19–32)
Calcium: 9.4 mg/dL (ref 8.4–10.5)
Cholesterol: 180 mg/dL (ref 0–200)
Creatinine, Ser: 0.74 mg/dL (ref 0.40–1.20)
Eosinophils Relative: 2 % (ref 0–5)
Glucose, Bld: 168 mg/dL — ABNORMAL HIGH (ref 70–99)
LDL Cholesterol: 74 mg/dL (ref 0–99)
Lymphocytes Relative: 27 % (ref 12–46)
MCV: 100.7 fL — ABNORMAL HIGH (ref 78.0–100.0)
Neutro Abs: 8.3 10*3/uL — ABNORMAL HIGH (ref 1.7–7.7)
Platelets: 324 10*3/uL (ref 150–400)
Potassium: 4.5 meq/L (ref 3.5–5.3)
RBC: 4.32 M/uL (ref 3.87–5.11)
TSH: 0.384 microintl units/mL (ref 0.350–5.50)
Total CHOL/HDL Ratio: 3.8
Triglycerides: 289 mg/dL — ABNORMAL HIGH (ref <150)

## 2007-06-24 ENCOUNTER — Encounter (INDEPENDENT_AMBULATORY_CARE_PROVIDER_SITE_OTHER): Payer: Self-pay | Admitting: Family Medicine

## 2007-06-30 ENCOUNTER — Ambulatory Visit: Payer: Self-pay | Admitting: Family Medicine

## 2007-07-01 ENCOUNTER — Encounter (INDEPENDENT_AMBULATORY_CARE_PROVIDER_SITE_OTHER): Payer: Self-pay | Admitting: Family Medicine

## 2007-07-01 ENCOUNTER — Telehealth (INDEPENDENT_AMBULATORY_CARE_PROVIDER_SITE_OTHER): Payer: Self-pay | Admitting: *Deleted

## 2007-07-03 ENCOUNTER — Encounter (INDEPENDENT_AMBULATORY_CARE_PROVIDER_SITE_OTHER): Payer: Self-pay | Admitting: Family Medicine

## 2007-07-03 IMAGING — US US CAROTID DUPLEX BILAT
1 series · 14 of 24 positions shown · non-contrast
Comparison: none

HISTORY: Near syncope, diabetes, hypertension, smoking

[Series 1: us carotid duplex bilat · 0.08mm/px · 14 of 58 slices shown]
[im 1/58]
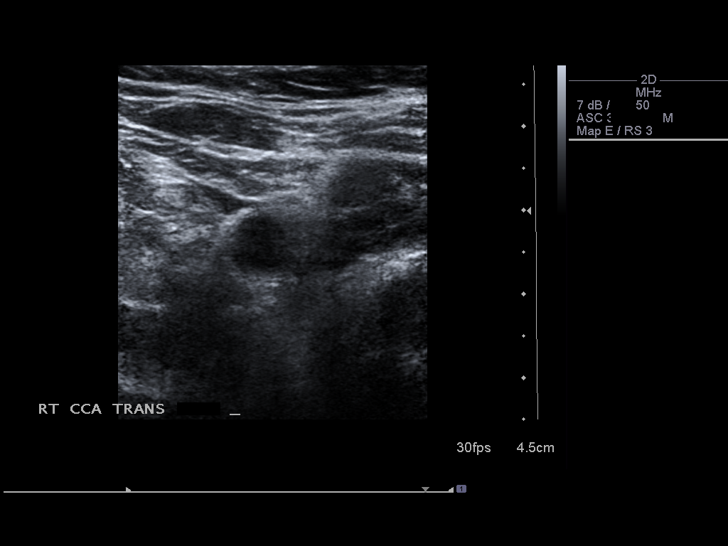
[im 5/58]
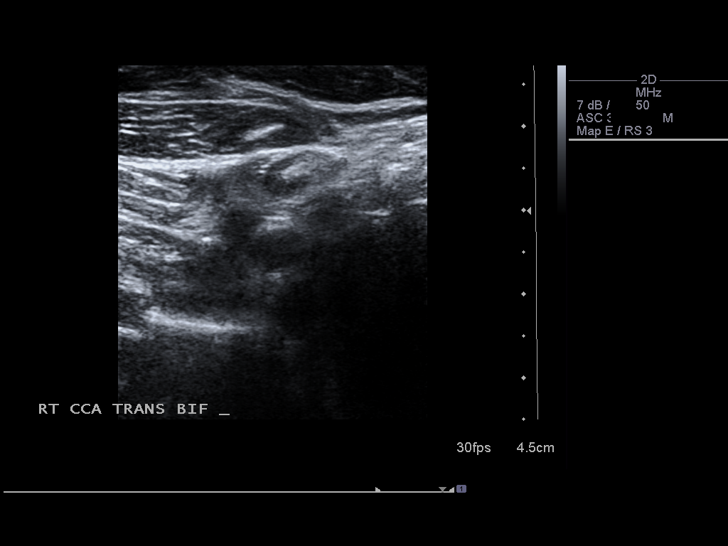
[im 10/58]
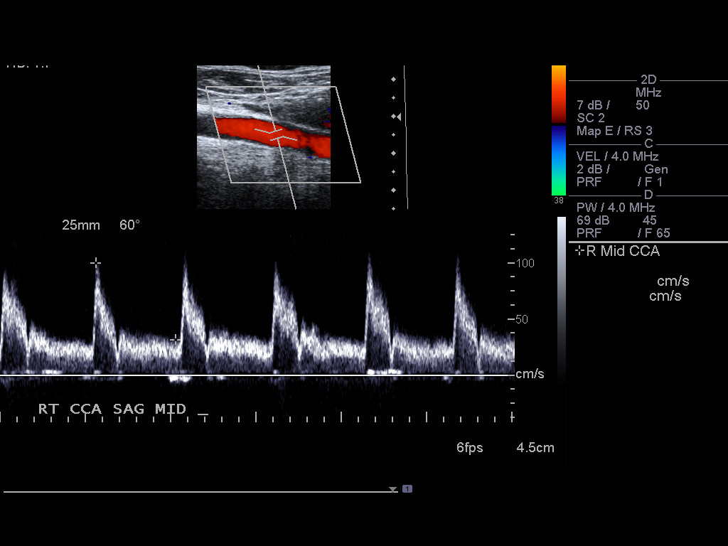
[im 15/58]
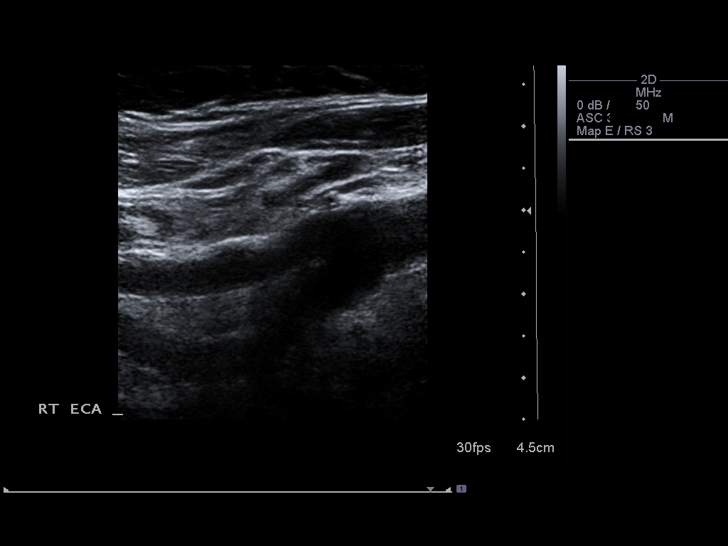
[im 18/58]
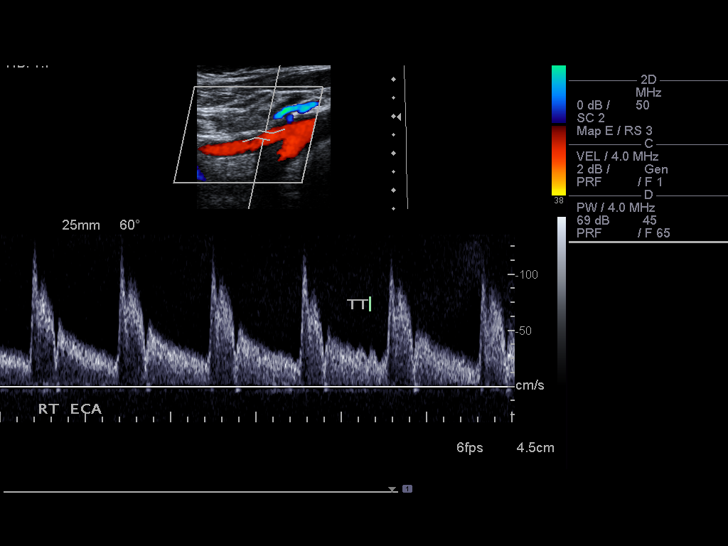
[im 23/58]
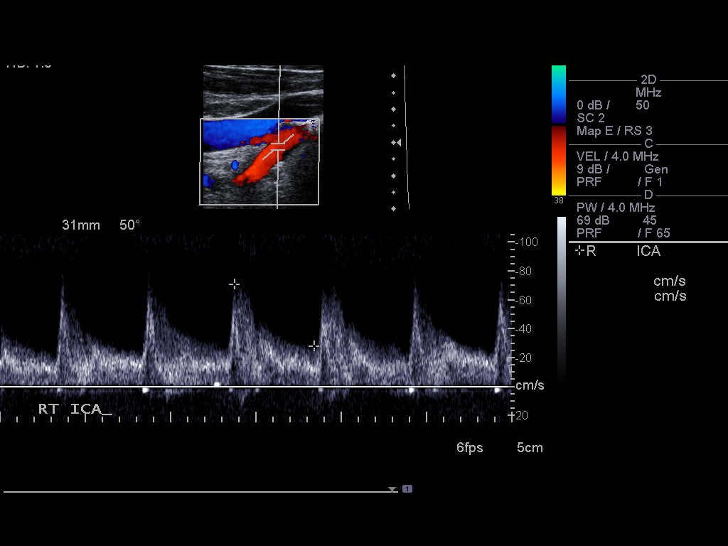
[im 28/58]
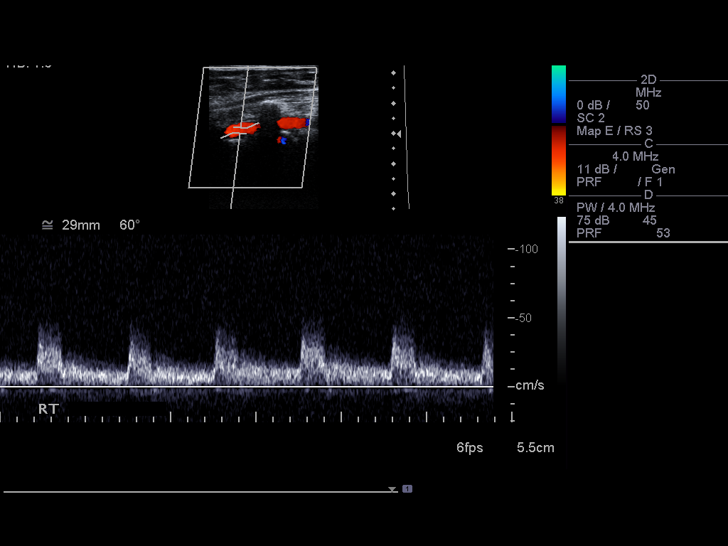
[im 30/58]
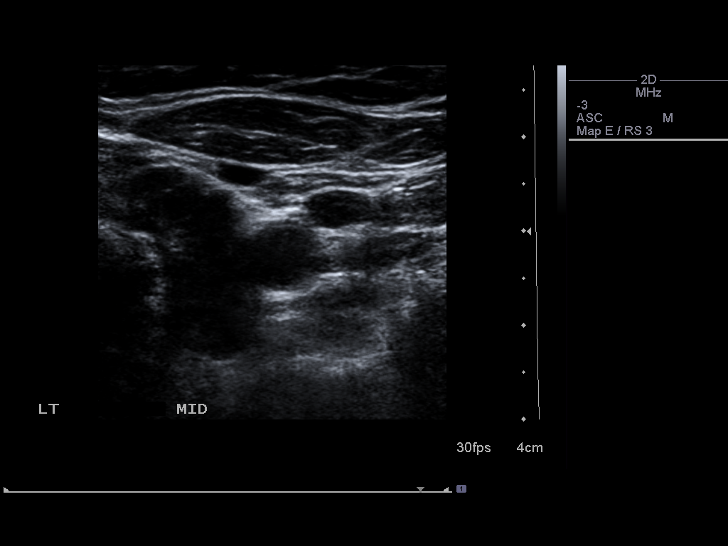
[im 35/58]
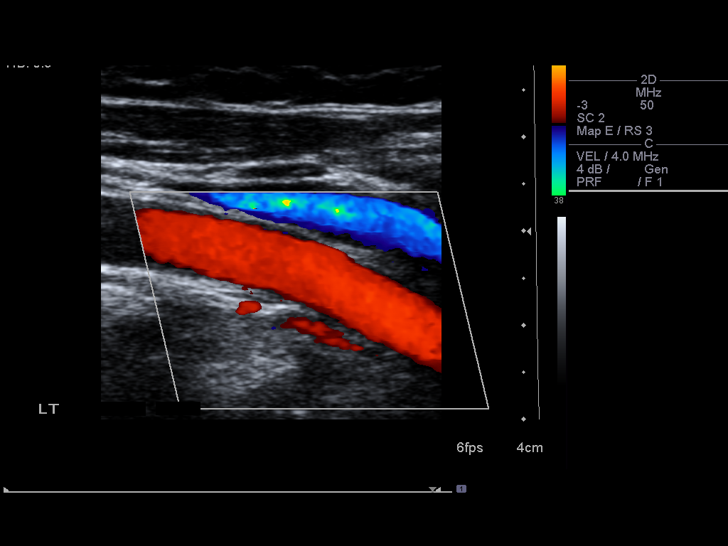
[im 40/58]
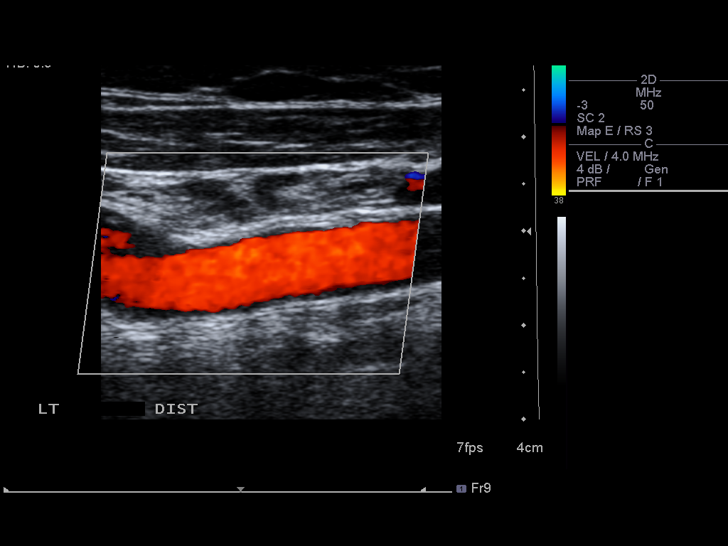
[im 45/58]
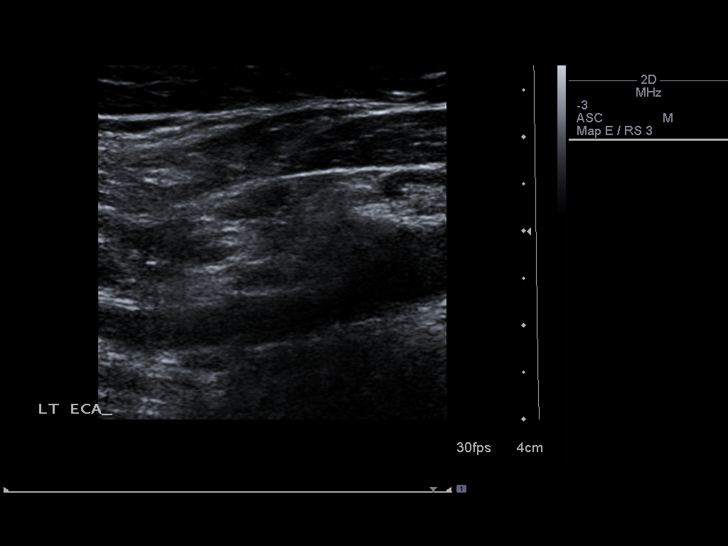
[im 48/58]
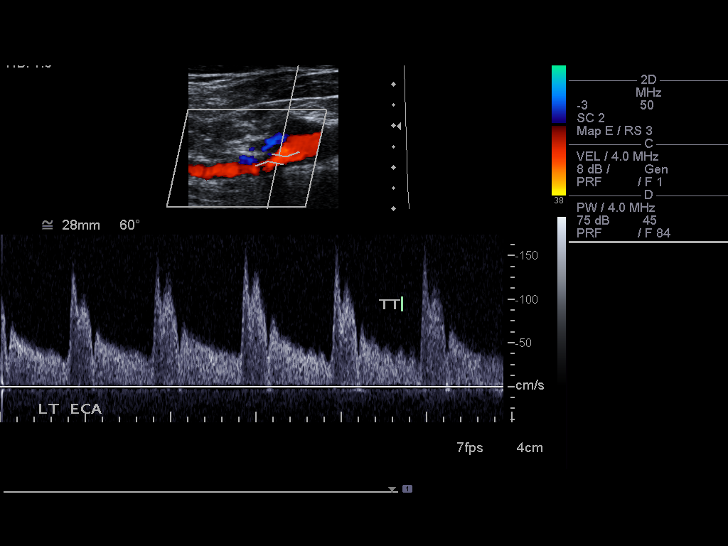
[im 53/58]
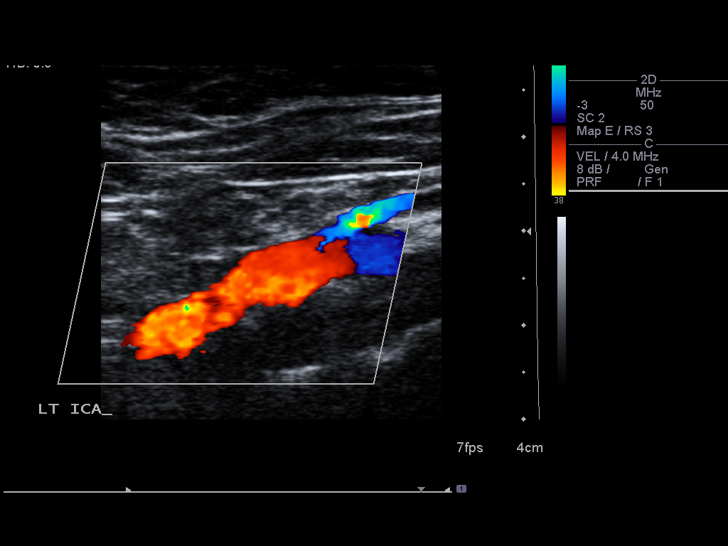
[im 58/58]
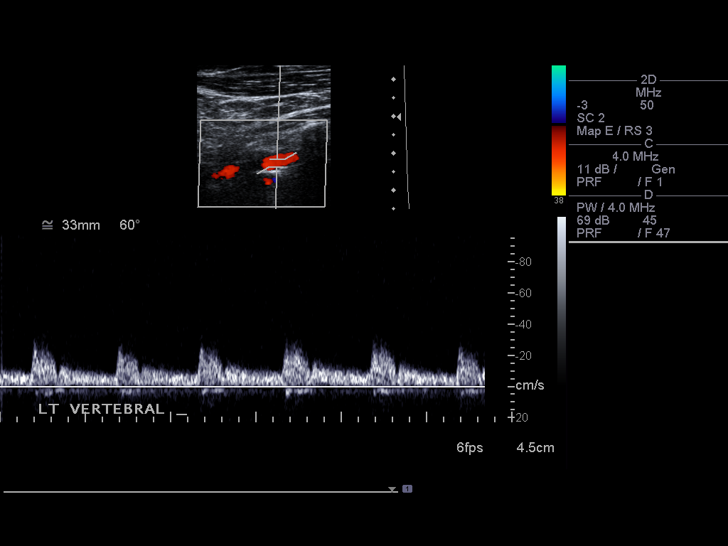

[14 of 24 positions shown; findings below may reference images not displayed]

ULTRASOUND CAROTID DUPLEX BILATERAL:

Gray scale, color Doppler, and pulse wave imaging of carotid systems performed
bilaterally. 

Plaque formation at proximal left ICA and minimally at left ECA.
No high velocity jet or significant turbulent flow on color Doppler imaging.
However, wave form analysis reveals turbulent flow and spectral broadening
within bilateral ICA and left ECA.
Minimal tortuosity carotid systems.
Following peak systolic velocities obtained, in cm per second:

Right CCA 100
Right ICA 75
Right ECA 125
Right ICA/CCA ratio
Right ICA EDV 25

Left CCA 93
Left ICA 89
Left ECA 148
Left ICA/CCA ratio
Left ICA EDV 38

Antegrade flow present bilateral vertebral arteries.
IMPRESSION: Mild plaque formation proximal left ICA and less at left ECA.
No evidence of hemodynamically significant stenosis.
Scattered turbulent flow within the carotid systems, likely related to mild
tortuosity and minimal plaque.

## 2007-07-03 IMAGING — MR MR HEAD W/O CM
7 of 8 series · 35 of 48 positions shown · IV contrast (agent unspecified)
Comparison: The study is read in conjunction with the previous CT scan of the brain of 08/06/05.

CLINICAL DATA: Near-syncope.  Diabetes.  Hypertension.  CAD.  Hyperlipidemia. 
MRI BRAIN WITHOUT CONTRAST:
TECHNIQUE: Multiplanar and multiecho pulse sequences of the brain and surrounding structures were obtained according to standard protocol without IV contrast.

[Series 2: T1 · sagittal · 5.0mm · 0.75mm/px · 2 of 12 slices shown (1 of 2)]
[im 1/12]
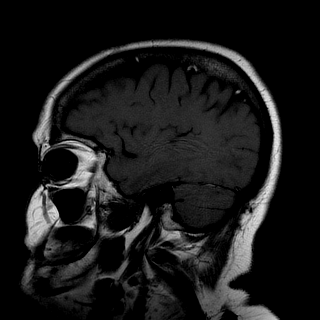
[im 12/12]
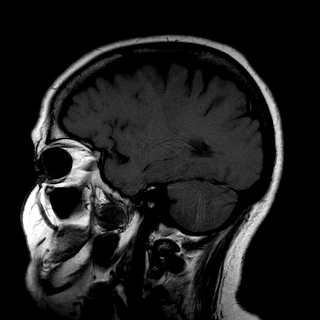

[Series 5: T2 · axial · 5.0mm · 0.57mm/px · z∈[-50,+87]mm · 5 of 24 slices shown (1 of 2)]
[im 1/24]
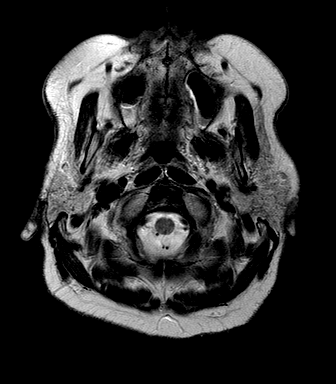
[im 6/24]
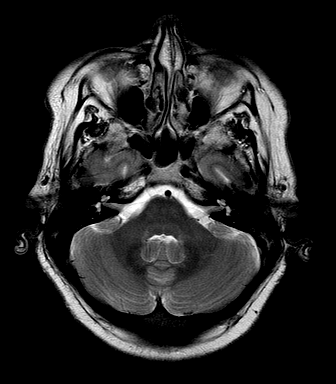
[im 12/24]
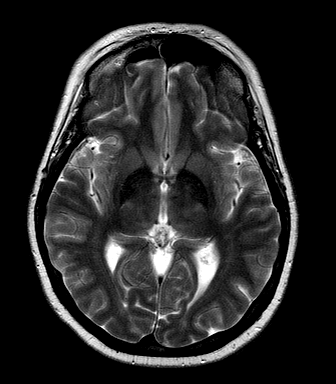
[im 18/24]
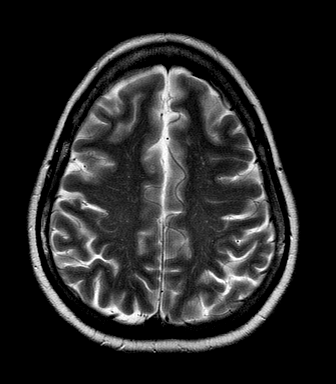
[im 24/24]
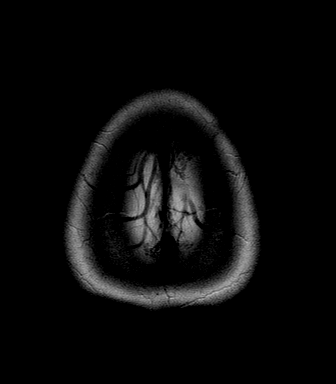

[Series 6: T1 · axial · 2.0mm · 0.57mm/px · z∈[-49,+96]mm · 8 of 74 slices shown (2 of 2)]
[im 1/74]
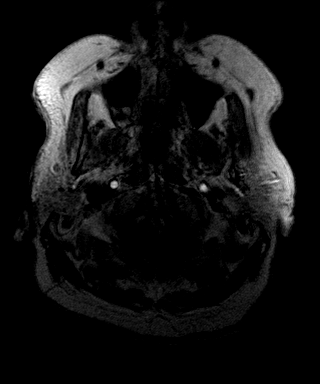
[im 11/74]
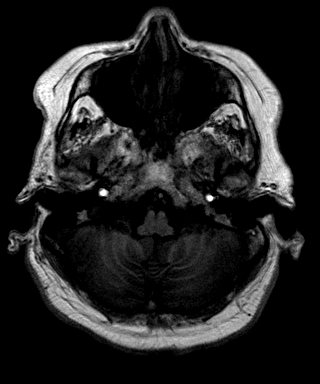
[im 21/74]
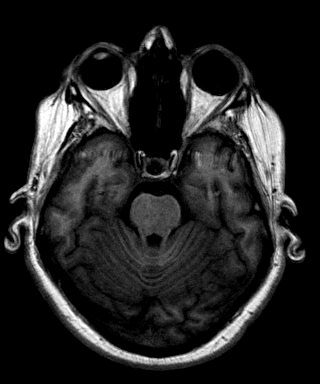
[im 32/74]
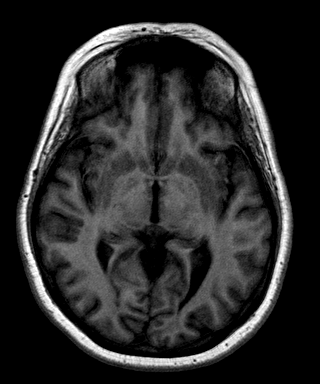
[im 42/74]
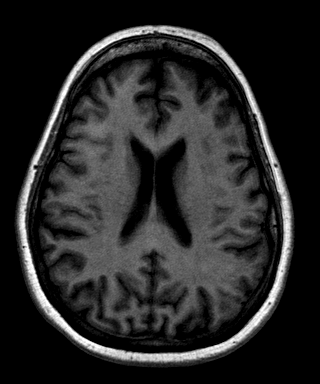
[im 53/74]
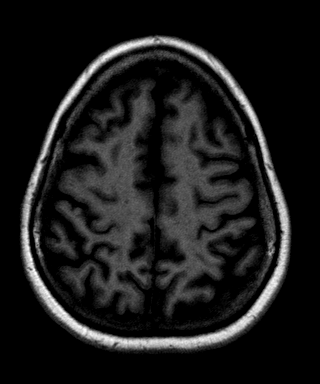
[im 63/74]
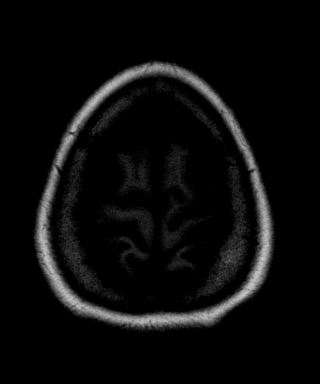
[im 74/74]
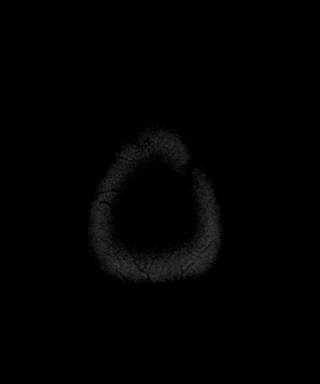

[Series 7: FLAIR · axial · 5.0mm · 0.57mm/px · z∈[-50,+87]mm · 5 of 24 slices shown]
[im 1/24]
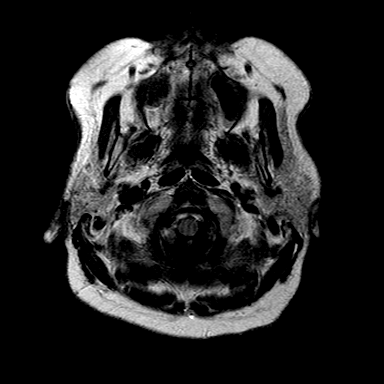
[im 6/24]
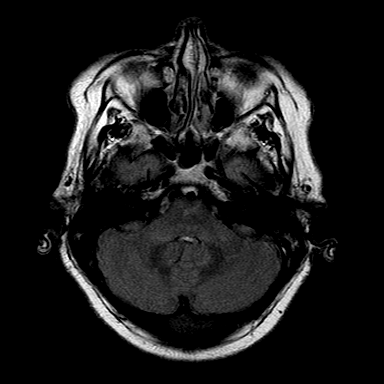
[im 12/24]
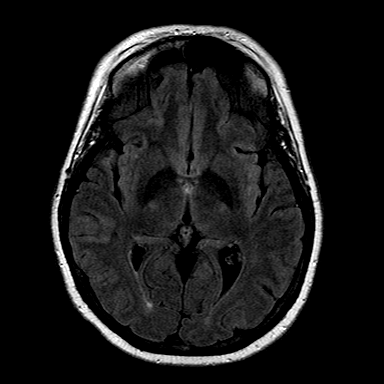
[im 18/24]
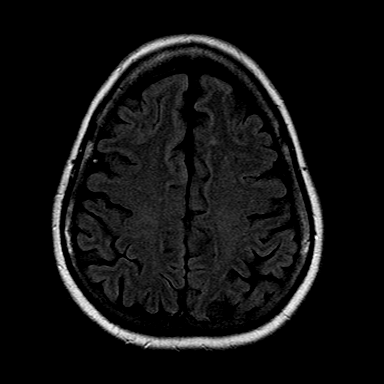
[im 24/24]
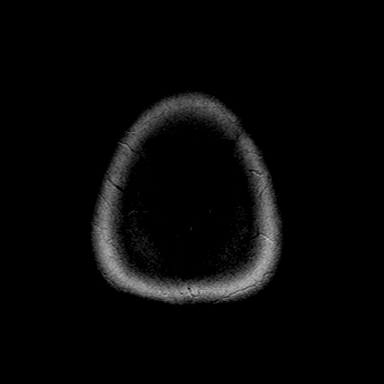

[Series 10: DWI · axial · 5.0mm · 0.86mm/px · z∈[-63,+68]mm · 5 of 25 slices shown (1 of 2)]
[im 1/25]
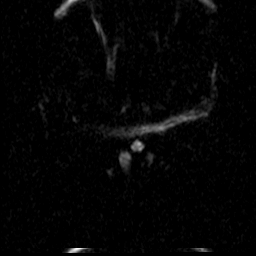
[im 7/25]
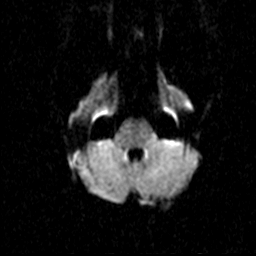
[im 13/25]
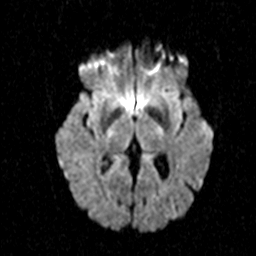
[im 19/25]
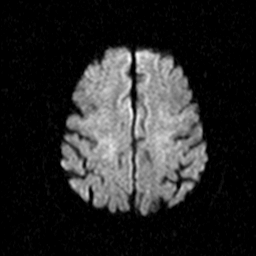
[im 25/25]
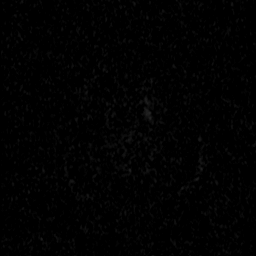

[Series 11: DWI · axial · 5.0mm · 0.86mm/px · z∈[-63,+68]mm · 5 of 25 slices shown (2 of 2)]
[im 1/25]
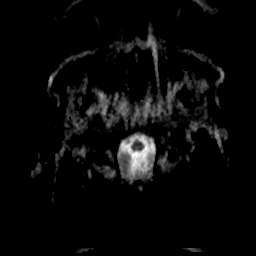
[im 7/25]
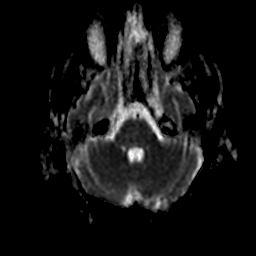
[im 13/25]
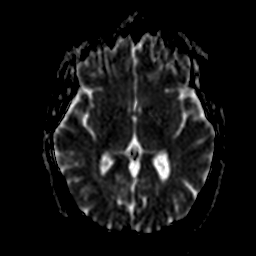
[im 19/25]
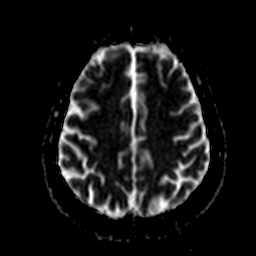
[im 25/25]
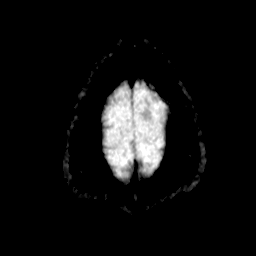

[Series 12: T2 · axial · 5.0mm · 0.43mm/px · z∈[-66,+71]mm · 5 of 24 slices shown (2 of 2)]
[im 1/24]
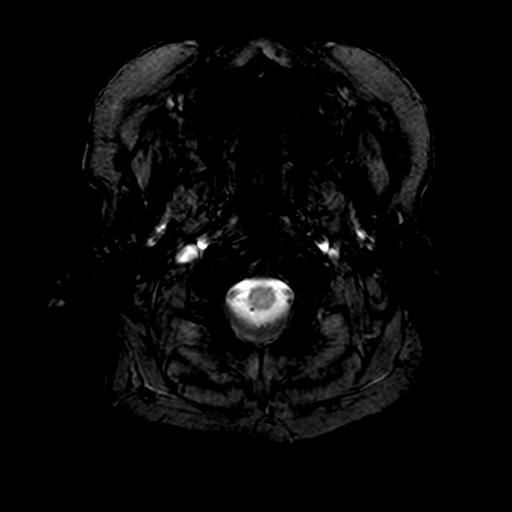
[im 6/24]
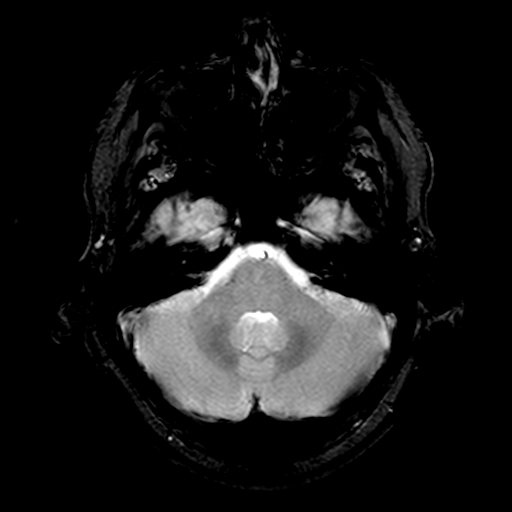
[im 12/24]
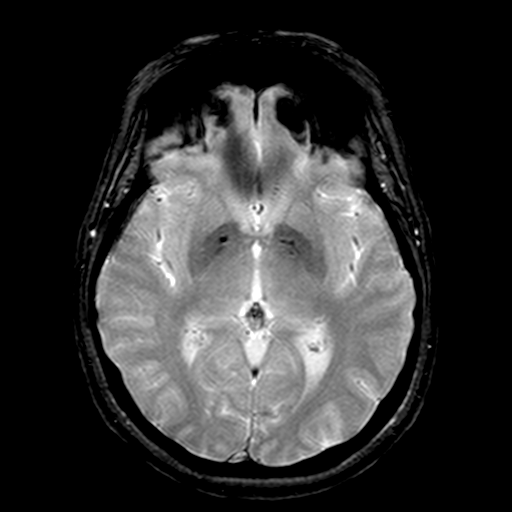
[im 18/24]
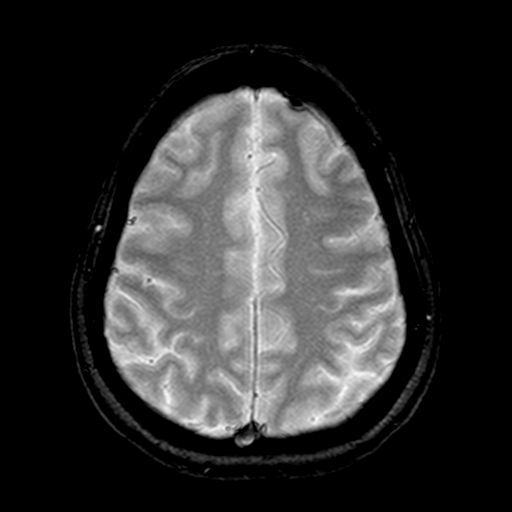
[im 24/24]
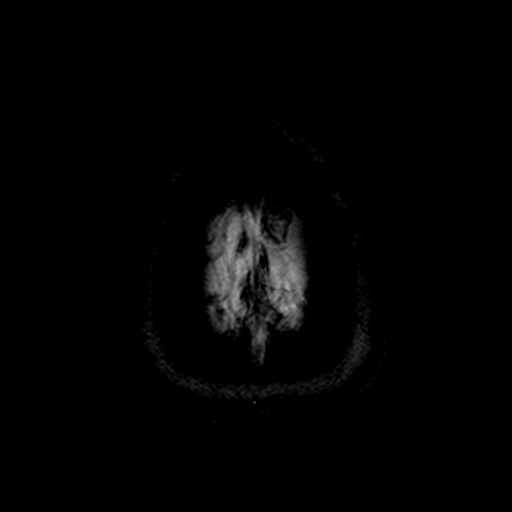

[35 of 48 positions shown; findings below may reference images not displayed]

FINDINGS: The gray-white matter differentiation is normal.  The sella demonstrates partially empty sella.  The clival signal is normal.  The cerebellar tonsils are at the level of the foramen magnum.  The odontoid process, the predental space, and the prevertebral soft tissues are normal. 
No acute ischemic change is seen on diffusion-weighted axial images.  Axial FLAIR and T2-weighted images demonstrate diffuse scattered signal hyperintensity in the subcortical white matter bifrontally, the centrum semiovale regions, and the right occipital horn periventricular white matter.  Minimal confluent hyperintensities are also noted adjacent to the frontal horns. 
There is a subtle hyperintense signal in the left paramedian pons seen on the FLAIR filling images.
Mastoid air cells are well developed and aerated.  The internal canals are symmetrical in signal and morphology.  Flow voids are maintained in the major vessels of the cranial skull base.  There is mild to moderate thickening of the mucosa in the ethmoid air cells, the frontal sinuses, and also the maxillary sinuses.
The visualized orbital contents demonstrate possible left eye lens surgery.  Clinical correlation is suggested.
IMPRESSION: 1.  No evidence of acute ischemia. 
2.  Nonspecific subcortical white matter changes as well as in the left paramedian pons.  These may represent ischemic gliosis due to small vessel disease due to hypertension and/or diabetes.  Less likely possibilities are a demyelinating process or vasculitis. 
3.  Mild sinusitis changes in the ethmoids, the right frontal sinus, and the maxillary sinuses.
4.  No blood breakdown products are seen on the SPGR sequences.

## 2007-07-07 ENCOUNTER — Encounter (INDEPENDENT_AMBULATORY_CARE_PROVIDER_SITE_OTHER): Payer: Self-pay | Admitting: Family Medicine

## 2007-07-09 ENCOUNTER — Ambulatory Visit: Payer: Self-pay | Admitting: Cardiology

## 2007-07-09 ENCOUNTER — Encounter (INDEPENDENT_AMBULATORY_CARE_PROVIDER_SITE_OTHER): Payer: Self-pay | Admitting: Family Medicine

## 2007-07-09 ENCOUNTER — Ambulatory Visit (HOSPITAL_COMMUNITY): Admission: RE | Admit: 2007-07-09 | Discharge: 2007-07-09 | Payer: Self-pay | Admitting: General Surgery

## 2007-07-15 ENCOUNTER — Encounter (INDEPENDENT_AMBULATORY_CARE_PROVIDER_SITE_OTHER): Payer: Self-pay | Admitting: Family Medicine

## 2007-07-16 ENCOUNTER — Observation Stay (HOSPITAL_COMMUNITY): Admission: RE | Admit: 2007-07-16 | Discharge: 2007-07-16 | Payer: Self-pay | Admitting: General Surgery

## 2007-07-25 ENCOUNTER — Encounter (INDEPENDENT_AMBULATORY_CARE_PROVIDER_SITE_OTHER): Payer: Self-pay | Admitting: Family Medicine

## 2007-08-06 ENCOUNTER — Encounter (INDEPENDENT_AMBULATORY_CARE_PROVIDER_SITE_OTHER): Payer: Self-pay | Admitting: Family Medicine

## 2007-08-19 ENCOUNTER — Encounter (INDEPENDENT_AMBULATORY_CARE_PROVIDER_SITE_OTHER): Payer: Self-pay | Admitting: Family Medicine

## 2007-08-22 ENCOUNTER — Encounter (INDEPENDENT_AMBULATORY_CARE_PROVIDER_SITE_OTHER): Payer: Self-pay | Admitting: Family Medicine

## 2007-09-02 ENCOUNTER — Ambulatory Visit: Payer: Self-pay | Admitting: Family Medicine

## 2007-09-02 LAB — CONVERTED CEMR LAB: Glucose, Bld: 66 mg/dL

## 2007-09-09 ENCOUNTER — Encounter (INDEPENDENT_AMBULATORY_CARE_PROVIDER_SITE_OTHER): Payer: Self-pay | Admitting: Family Medicine

## 2007-09-17 ENCOUNTER — Encounter (INDEPENDENT_AMBULATORY_CARE_PROVIDER_SITE_OTHER): Payer: Self-pay | Admitting: Family Medicine

## 2007-09-25 IMAGING — CT CT PELVIS WO/W CM
2 of 6 series · 13 of 46 positions shown, 15 images · IV contrast (omnipaque)
Comparison: 05/29/05.

CLINICAL DATA: Left-sided abdominal pain, irritable bowel syndrome, and constipation.
ABDOMEN CT WITHOUT AND WITH CONTRAST ? 01/10/07:
TECHNIQUE: Multidetector CT imaging of the abdomen was performed following the standard protocol both before and during bolus administration of intravenous contrast.  The patient received a 13 hour medication prep for history of IV contrast reaction. There were no immediate complications after IV contrast was given.
Contrast: 100 cc Omnipaque 300 IV.
TECHNIQUE: Multidetector CT imaging of the pelvis was performed following the standard protocol both before and during bolus administration of intravenous contrast.

[Series 2: abd_pel_wo 5.0 b40f · axial · 0.73mm/px · z∈[-458,-88]mm · 10 of 92 slices shown, 12 images]
[im 9/92  soft-tissue]
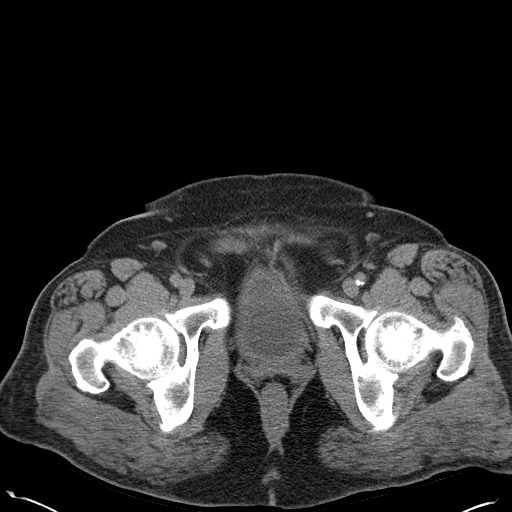
[im 9/92  bone]
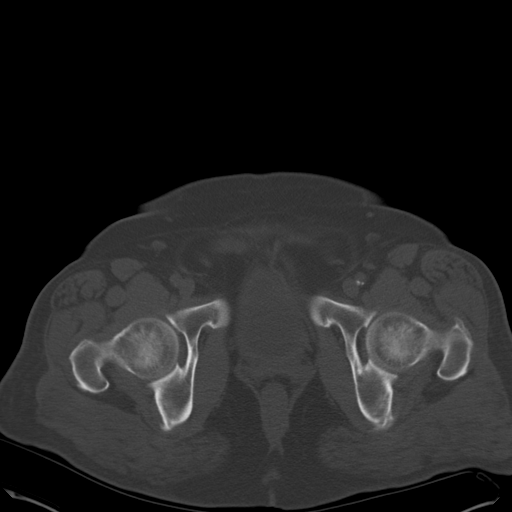
[im 17/92  soft-tissue]
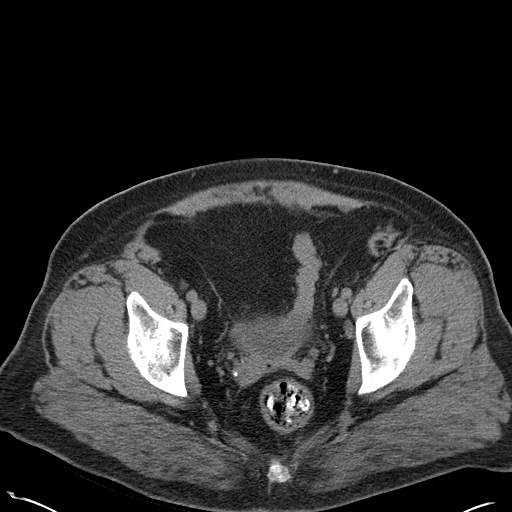
[im 25/92  soft-tissue]
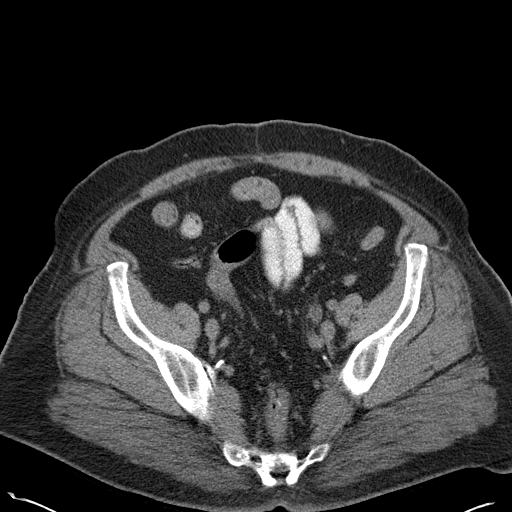
[im 34/92  soft-tissue]
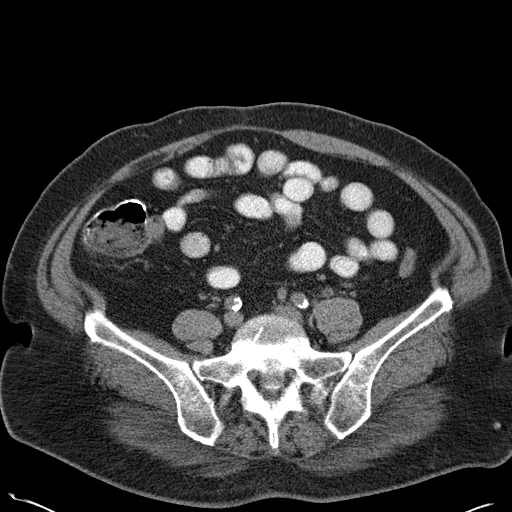
[im 42/92  soft-tissue]
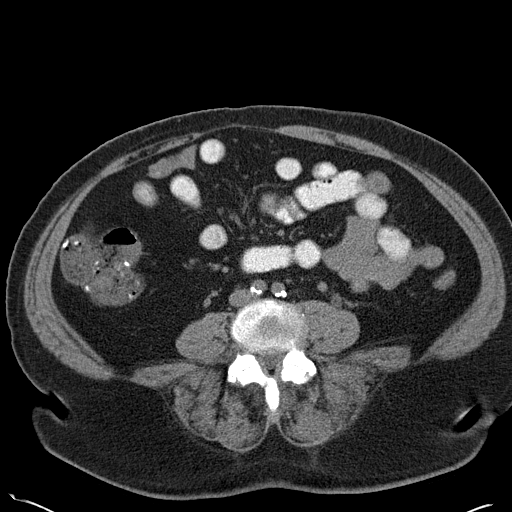
[im 50/92  soft-tissue]
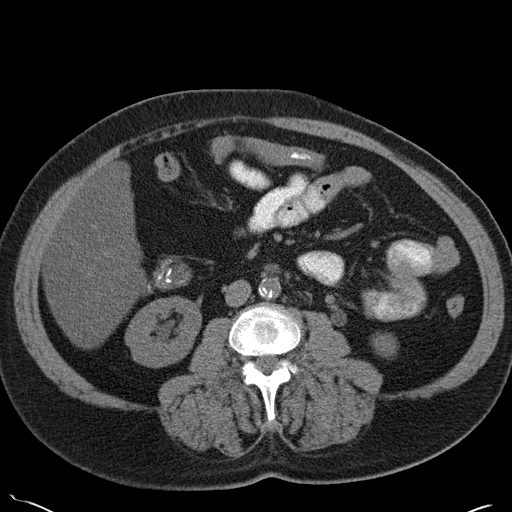
[im 58/92  soft-tissue]
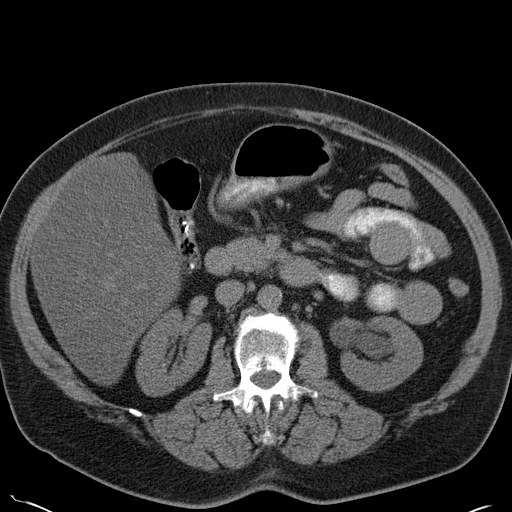
[im 67/92  soft-tissue]
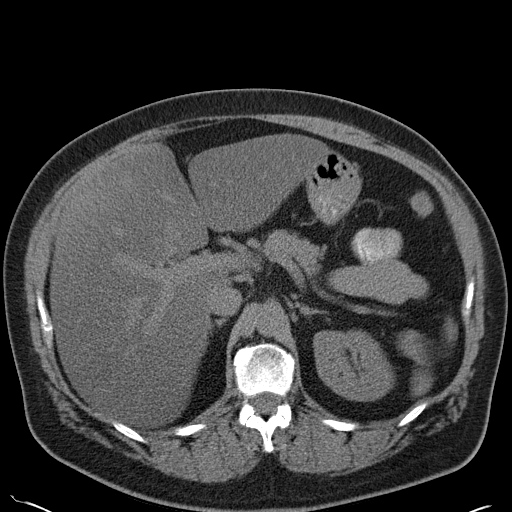
[im 75/92  soft-tissue]
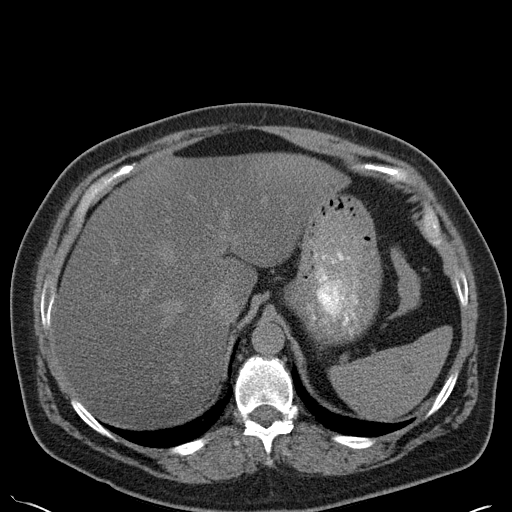
[im 75/92  bone]
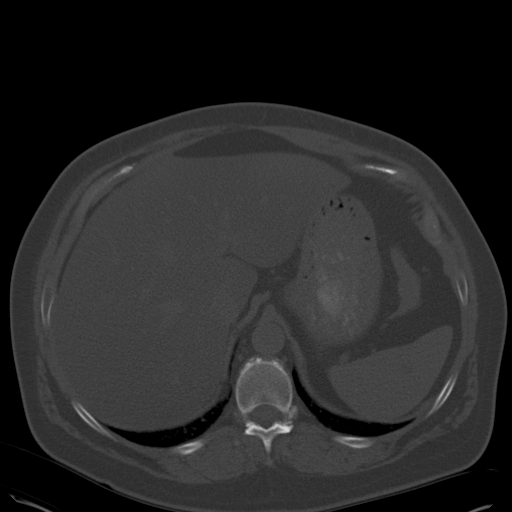
[im 83/92  soft-tissue]
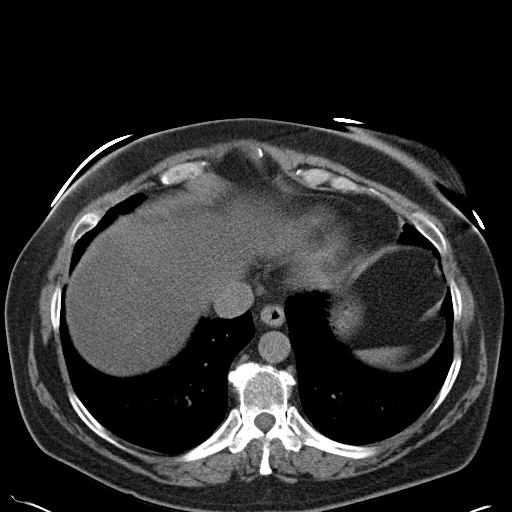

[Series 5: mpr coronal a/p cor · coronal · 0.67mm/px · 3 of 81 slices shown]
[im 27/81  soft-tissue]
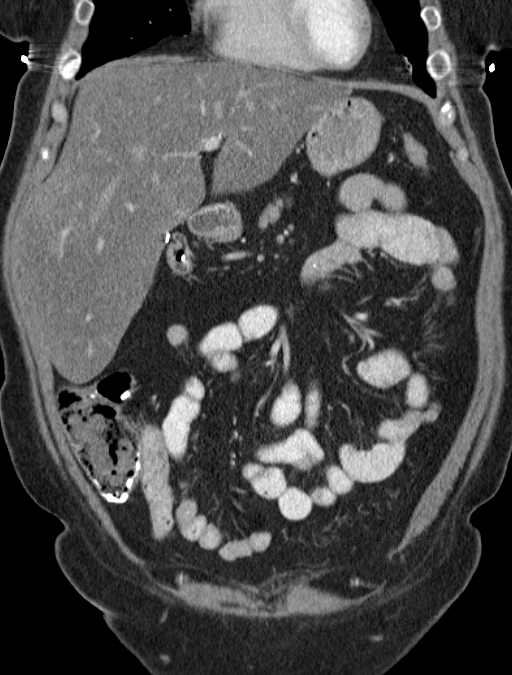
[im 36/81  soft-tissue]
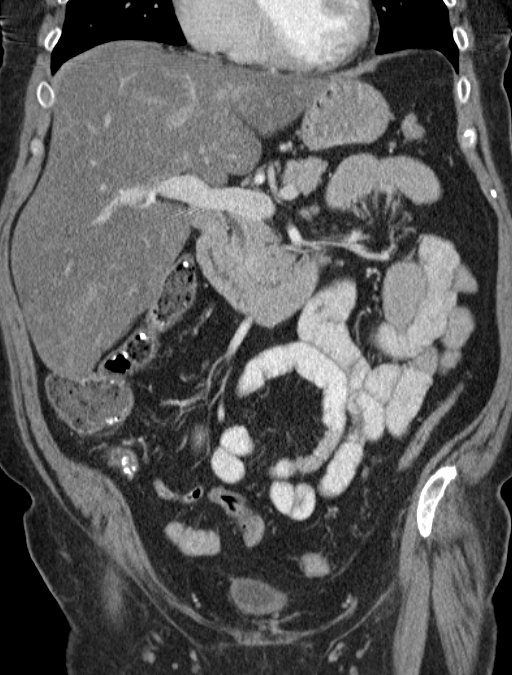
[im 45/81  soft-tissue]
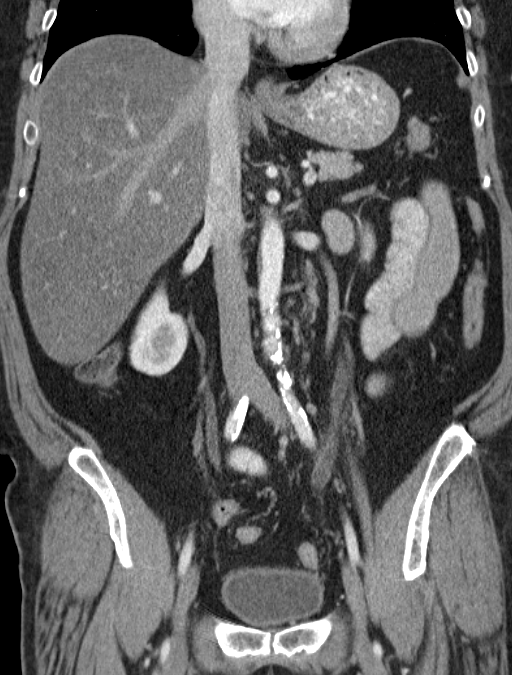

[13 of 46 positions shown; findings below may reference images not displayed]

FINDINGS: The liver is diffusely decreased in attenuation with areas of peripheral sparing.  It measures approximately 22.5 cm.  Cholecystectomy.  The adrenal glands are unremarkable.  There are small stones in both kidneys.  ild to moderate left hydronephrosis secondary to a 2 mm distal left ureteral stone.  ild left periureteric stranding.  
The spleen, pancreas, stomach, and small bowel are unremarkable.  Atherosclerotic calcification of the arterial vasculature.  Scattered lymph nodes are small in size.
IMPRESSION: 1.  Mild to moderate left hydronephrosis secondary to 2 mm distal left ureteral stone.
2.  Fatty enlarged liver.  
PELVIS CT WITHOUT AND WITH CONTRAST ? 01/10/07:
FINDINGS: The uterus has been removed.  The colon and appendix are unremarkable.  No pathologically enlarged lymph nodes.  No free fluid.  No worrisome lytic or sclerotic lesions.
IMPRESSION: No additional acute findings.

## 2007-10-06 ENCOUNTER — Ambulatory Visit: Payer: Self-pay | Admitting: Family Medicine

## 2007-10-16 ENCOUNTER — Encounter: Payer: Self-pay | Admitting: Family Medicine

## 2007-10-17 ENCOUNTER — Telehealth (INDEPENDENT_AMBULATORY_CARE_PROVIDER_SITE_OTHER): Payer: Self-pay | Admitting: Family Medicine

## 2007-10-30 ENCOUNTER — Ambulatory Visit: Payer: Self-pay | Admitting: Family Medicine

## 2007-10-30 ENCOUNTER — Ambulatory Visit (HOSPITAL_COMMUNITY): Admission: RE | Admit: 2007-10-30 | Discharge: 2007-10-30 | Payer: Self-pay | Admitting: Family Medicine

## 2007-10-31 ENCOUNTER — Telehealth (INDEPENDENT_AMBULATORY_CARE_PROVIDER_SITE_OTHER): Payer: Self-pay | Admitting: *Deleted

## 2007-11-12 ENCOUNTER — Encounter (INDEPENDENT_AMBULATORY_CARE_PROVIDER_SITE_OTHER): Payer: Self-pay | Admitting: Family Medicine

## 2007-11-24 ENCOUNTER — Encounter (INDEPENDENT_AMBULATORY_CARE_PROVIDER_SITE_OTHER): Payer: Self-pay | Admitting: Family Medicine

## 2007-11-24 ENCOUNTER — Telehealth (INDEPENDENT_AMBULATORY_CARE_PROVIDER_SITE_OTHER): Payer: Self-pay | Admitting: *Deleted

## 2007-11-28 ENCOUNTER — Ambulatory Visit: Payer: Self-pay | Admitting: Family Medicine

## 2007-11-28 LAB — CONVERTED CEMR LAB
Glucose, Bld: 276 mg/dL
Hgb A1c MFr Bld: 8 %

## 2007-12-12 ENCOUNTER — Ambulatory Visit (HOSPITAL_COMMUNITY): Admission: RE | Admit: 2007-12-12 | Discharge: 2007-12-12 | Payer: Self-pay | Admitting: Family Medicine

## 2007-12-12 ENCOUNTER — Ambulatory Visit: Payer: Self-pay | Admitting: Family Medicine

## 2007-12-26 ENCOUNTER — Ambulatory Visit: Payer: Self-pay | Admitting: Family Medicine

## 2008-01-13 ENCOUNTER — Telehealth (INDEPENDENT_AMBULATORY_CARE_PROVIDER_SITE_OTHER): Payer: Self-pay | Admitting: *Deleted

## 2008-01-19 ENCOUNTER — Encounter (INDEPENDENT_AMBULATORY_CARE_PROVIDER_SITE_OTHER): Payer: Self-pay | Admitting: Family Medicine

## 2008-01-29 ENCOUNTER — Ambulatory Visit: Payer: Self-pay | Admitting: Family Medicine

## 2008-02-02 ENCOUNTER — Encounter (INDEPENDENT_AMBULATORY_CARE_PROVIDER_SITE_OTHER): Payer: Self-pay | Admitting: Family Medicine

## 2008-02-02 ENCOUNTER — Telehealth (INDEPENDENT_AMBULATORY_CARE_PROVIDER_SITE_OTHER): Payer: Self-pay | Admitting: Family Medicine

## 2008-02-06 ENCOUNTER — Ambulatory Visit: Payer: Self-pay | Admitting: Family Medicine

## 2008-02-06 ENCOUNTER — Telehealth (INDEPENDENT_AMBULATORY_CARE_PROVIDER_SITE_OTHER): Payer: Self-pay | Admitting: *Deleted

## 2008-02-10 ENCOUNTER — Ambulatory Visit (HOSPITAL_COMMUNITY): Admission: RE | Admit: 2008-02-10 | Discharge: 2008-02-10 | Payer: Self-pay | Admitting: Family Medicine

## 2008-02-10 ENCOUNTER — Telehealth (INDEPENDENT_AMBULATORY_CARE_PROVIDER_SITE_OTHER): Payer: Self-pay | Admitting: *Deleted

## 2008-02-16 ENCOUNTER — Telehealth (INDEPENDENT_AMBULATORY_CARE_PROVIDER_SITE_OTHER): Payer: Self-pay | Admitting: *Deleted

## 2008-02-19 ENCOUNTER — Encounter (INDEPENDENT_AMBULATORY_CARE_PROVIDER_SITE_OTHER): Payer: Self-pay | Admitting: Family Medicine

## 2008-02-19 ENCOUNTER — Telehealth (INDEPENDENT_AMBULATORY_CARE_PROVIDER_SITE_OTHER): Payer: Self-pay | Admitting: *Deleted

## 2008-02-23 ENCOUNTER — Encounter (INDEPENDENT_AMBULATORY_CARE_PROVIDER_SITE_OTHER): Payer: Self-pay | Admitting: Family Medicine

## 2008-02-24 ENCOUNTER — Encounter (INDEPENDENT_AMBULATORY_CARE_PROVIDER_SITE_OTHER): Payer: Self-pay | Admitting: Family Medicine

## 2008-02-24 ENCOUNTER — Telehealth (INDEPENDENT_AMBULATORY_CARE_PROVIDER_SITE_OTHER): Payer: Self-pay | Admitting: *Deleted

## 2008-02-24 LAB — CONVERTED CEMR LAB
Alkaline Phosphatase: 69 units/L (ref 39–117)
BUN: 20 mg/dL (ref 6–23)
CO2: 22 meq/L (ref 19–32)
Calcium: 9.3 mg/dL (ref 8.4–10.5)
Creatinine, Ser: 0.78 mg/dL (ref 0.40–1.20)
LDL Cholesterol: 91 mg/dL (ref 0–99)
Total Bilirubin: 0.3 mg/dL (ref 0.3–1.2)
Total CHOL/HDL Ratio: 4.8
Triglycerides: 247 mg/dL — ABNORMAL HIGH (ref <150)

## 2008-03-01 ENCOUNTER — Telehealth (INDEPENDENT_AMBULATORY_CARE_PROVIDER_SITE_OTHER): Payer: Self-pay | Admitting: *Deleted

## 2008-03-01 ENCOUNTER — Ambulatory Visit: Payer: Self-pay | Admitting: Family Medicine

## 2008-03-02 ENCOUNTER — Telehealth (INDEPENDENT_AMBULATORY_CARE_PROVIDER_SITE_OTHER): Payer: Self-pay | Admitting: *Deleted

## 2008-03-13 ENCOUNTER — Encounter (INDEPENDENT_AMBULATORY_CARE_PROVIDER_SITE_OTHER): Payer: Self-pay | Admitting: Family Medicine

## 2008-03-16 ENCOUNTER — Encounter (INDEPENDENT_AMBULATORY_CARE_PROVIDER_SITE_OTHER): Payer: Self-pay | Admitting: Family Medicine

## 2008-03-19 ENCOUNTER — Ambulatory Visit: Payer: Self-pay | Admitting: Internal Medicine

## 2008-03-23 IMAGING — CT CT PELVIS W/O CM
1 of 2 series · 15 of 32 positions shown, 19 images · non-contrast
Comparison: none

HISTORY: Mid to left abdominal pain, periumbilical mass

[Series 2: abd|pel w/o 5.0 b40f · axial · non-contrast · 0.77mm/px · z∈[-666,-226]mm · 15 of 96 slices shown, 19 images]
[im 4/96  soft-tissue]
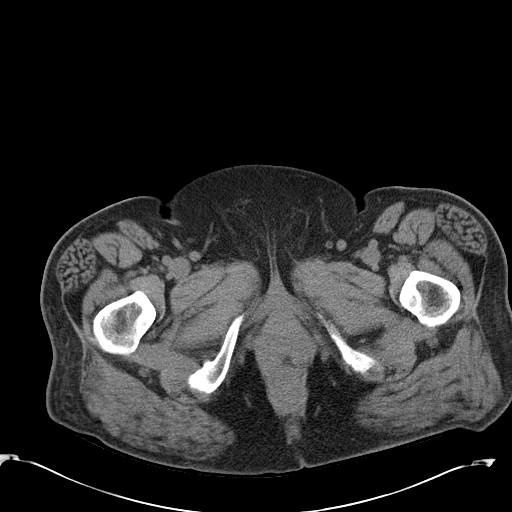
[im 4/96  bone]
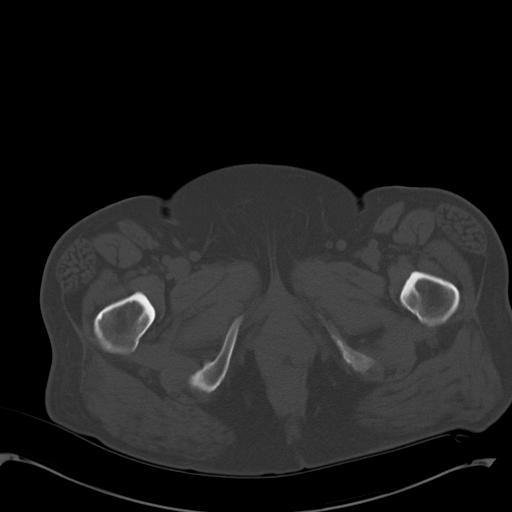
[im 12/96  soft-tissue]
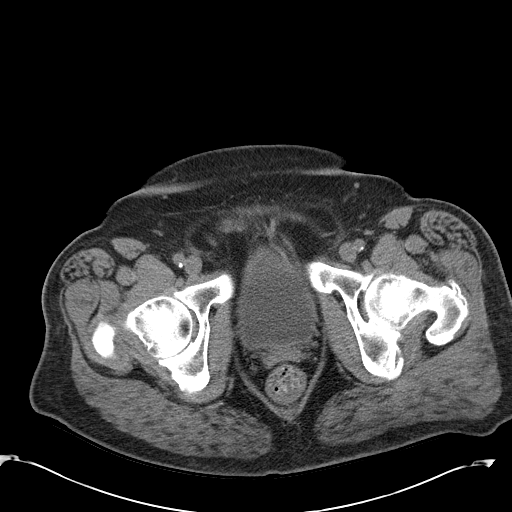
[im 20/96  soft-tissue]
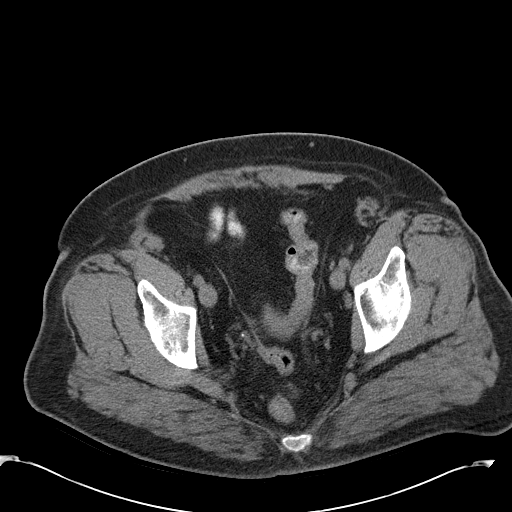
[im 27/96  soft-tissue]
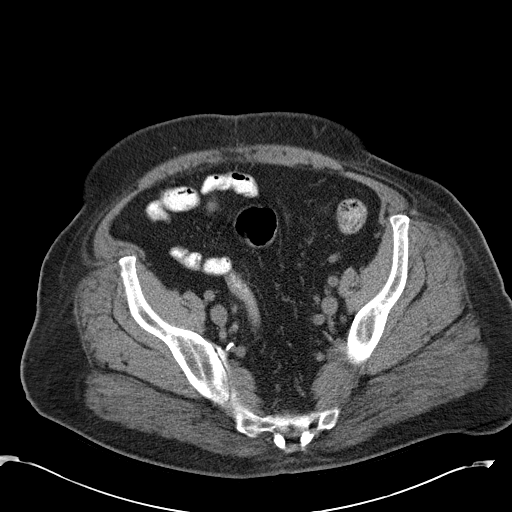
[im 35/96  soft-tissue]
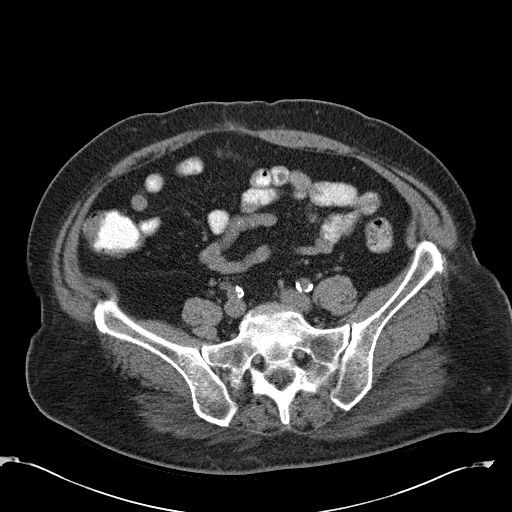
[im 42/96  soft-tissue]
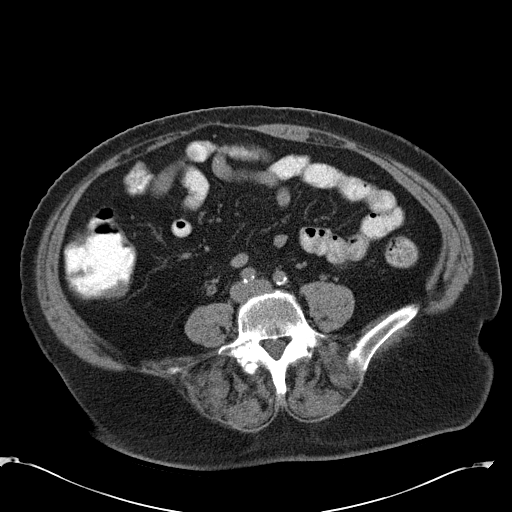
[im 50/96  soft-tissue]
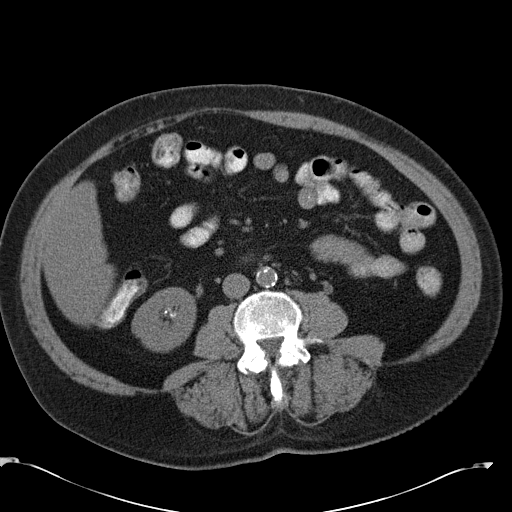
[im 54/96  soft-tissue]
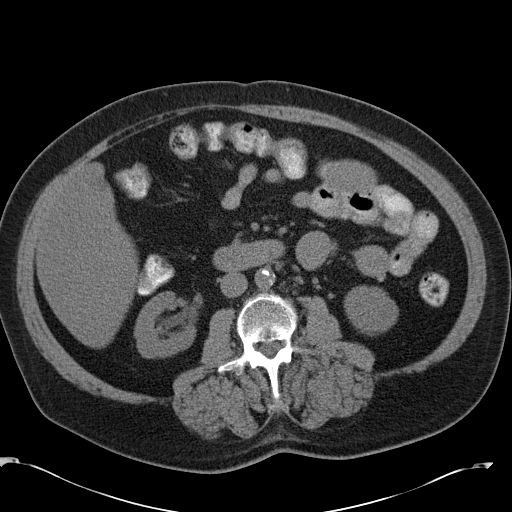
[im 61/96  soft-tissue]
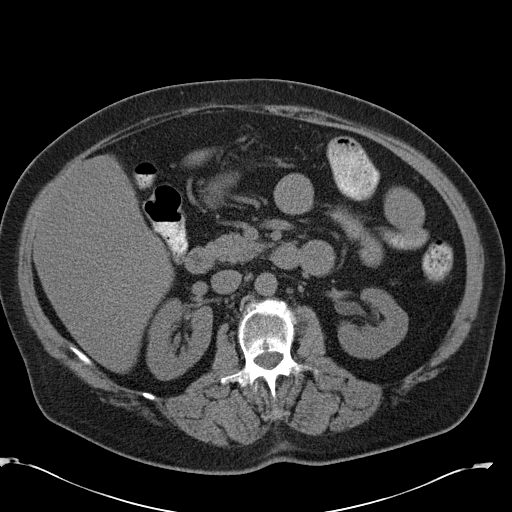
[im 61/96  bone]
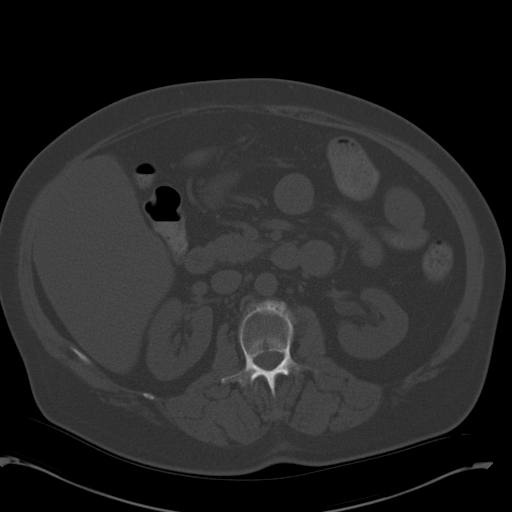
[im 69/96  soft-tissue]
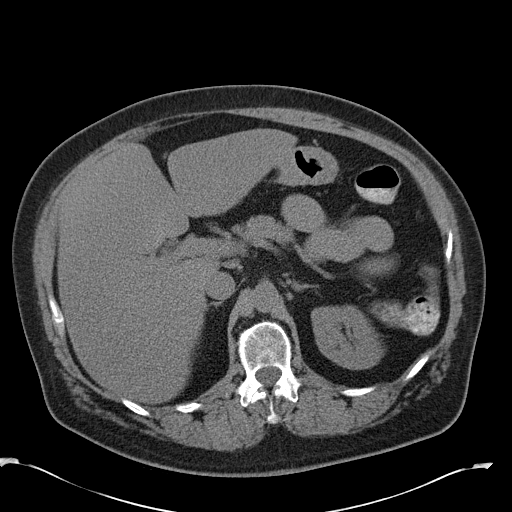
[im 77/96  soft-tissue]
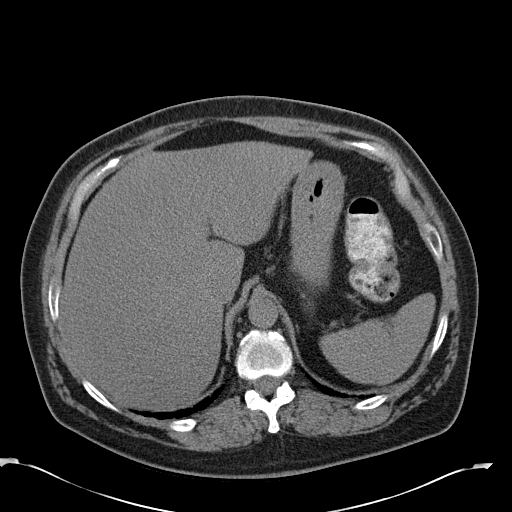
[im 80/96  lung]
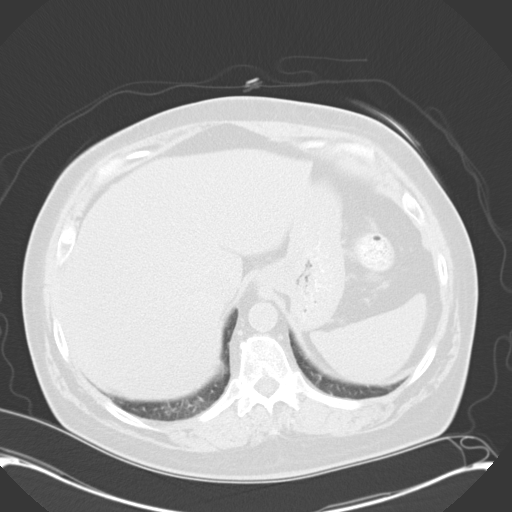
[im 84/96  soft-tissue]
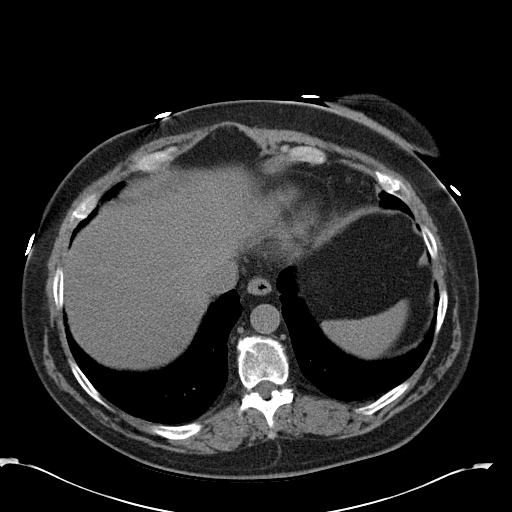
[im 84/96  lung]
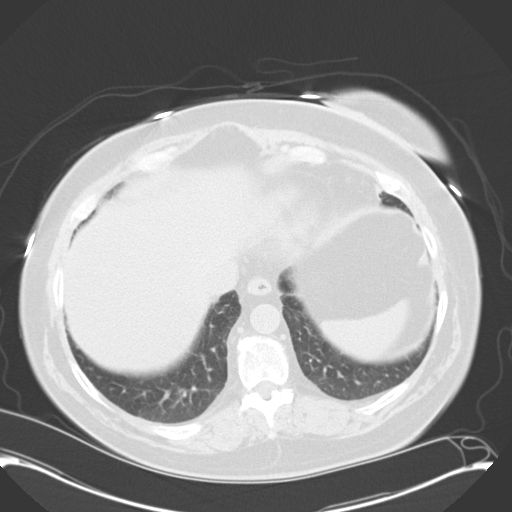
[im 88/96  lung]
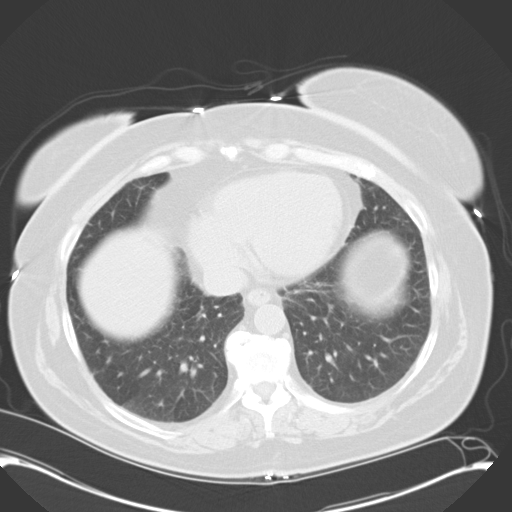
[im 92/96  soft-tissue]
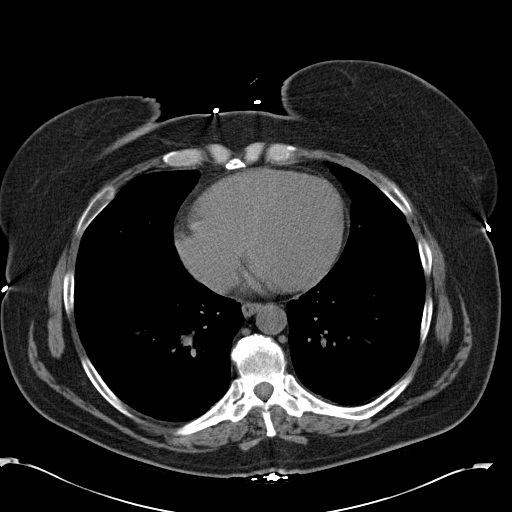
[im 92/96  lung]
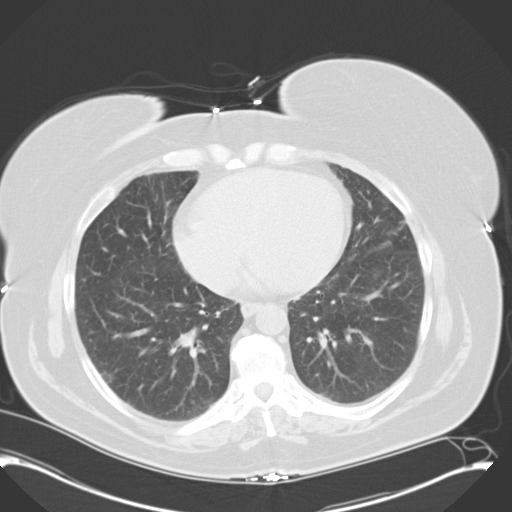

[15 of 32 positions shown; findings below may reference images not displayed]

CT ABDOMEN AND PELVIS WITHOUT CONTRAST:

Multidetector helical CT imaging abdomen pelvis compared to 01/10/2007.
Exam utilized dilute oral contrast.
IV contrast not administered due to history of prior contrast reaction.
Sagittal and coronal images reconstructed from axial data set.

CT ABDOMEN:

Minimal atelectasis base of right middle lobe.
Tiny bilateral nonobstructing renal calculi without hydronephrosis.
Status post cholecystectomy.
Remaining solid organs and bowel loops in upper abdomen unremarkable for exam
lacking IV contrast.
No mass, adenopathy, free fluid, or inflammatory process.
No abdominal wall hernia or fascial defect identified.
IMPRESSION: Tiny bilateral nonobstructing renal calculi.
Cholecystectomy.
No acute upper abdominal abnormalities.

CT PELVIS:

Normal appendix.
Uterus surgically absent with nonvisualization of ovaries.
Unremarkable bladder and distal ureters.
Large and small bowel loops normal.
No mass, adenopathy, free fluid, or inflammatory process.
No abdominal wall hernia or mass seen.
IMPRESSION: No acute abnormalities.

## 2008-03-24 ENCOUNTER — Encounter (INDEPENDENT_AMBULATORY_CARE_PROVIDER_SITE_OTHER): Payer: Self-pay | Admitting: Family Medicine

## 2008-03-29 ENCOUNTER — Ambulatory Visit: Payer: Self-pay | Admitting: Family Medicine

## 2008-03-30 ENCOUNTER — Encounter (INDEPENDENT_AMBULATORY_CARE_PROVIDER_SITE_OTHER): Payer: Self-pay | Admitting: Family Medicine

## 2008-04-01 ENCOUNTER — Ambulatory Visit: Payer: Self-pay | Admitting: Family Medicine

## 2008-04-02 ENCOUNTER — Telehealth (INDEPENDENT_AMBULATORY_CARE_PROVIDER_SITE_OTHER): Payer: Self-pay | Admitting: Family Medicine

## 2008-04-08 ENCOUNTER — Telehealth (INDEPENDENT_AMBULATORY_CARE_PROVIDER_SITE_OTHER): Payer: Self-pay | Admitting: *Deleted

## 2008-04-22 ENCOUNTER — Ambulatory Visit: Payer: Self-pay | Admitting: Family Medicine

## 2008-04-22 ENCOUNTER — Ambulatory Visit (HOSPITAL_COMMUNITY): Admission: RE | Admit: 2008-04-22 | Discharge: 2008-04-22 | Payer: Self-pay | Admitting: Family Medicine

## 2008-04-29 ENCOUNTER — Ambulatory Visit: Payer: Self-pay | Admitting: Family Medicine

## 2008-05-12 ENCOUNTER — Telehealth (INDEPENDENT_AMBULATORY_CARE_PROVIDER_SITE_OTHER): Payer: Self-pay | Admitting: *Deleted

## 2008-06-01 ENCOUNTER — Encounter: Admission: RE | Admit: 2008-06-01 | Discharge: 2008-06-01 | Payer: Self-pay | Admitting: Family Medicine

## 2008-06-03 ENCOUNTER — Ambulatory Visit: Payer: Self-pay | Admitting: Cardiology

## 2008-06-04 ENCOUNTER — Encounter (INDEPENDENT_AMBULATORY_CARE_PROVIDER_SITE_OTHER): Payer: Self-pay | Admitting: Family Medicine

## 2008-06-08 ENCOUNTER — Encounter (INDEPENDENT_AMBULATORY_CARE_PROVIDER_SITE_OTHER): Payer: Self-pay | Admitting: Family Medicine

## 2008-06-09 ENCOUNTER — Encounter: Admission: RE | Admit: 2008-06-09 | Discharge: 2008-06-09 | Payer: Self-pay | Admitting: Family Medicine

## 2008-06-09 ENCOUNTER — Encounter (INDEPENDENT_AMBULATORY_CARE_PROVIDER_SITE_OTHER): Payer: Self-pay | Admitting: Family Medicine

## 2008-06-09 LAB — CONVERTED CEMR LAB
AST: 9 units/L (ref 0–37)
Alkaline Phosphatase: 67 units/L (ref 39–117)
Basophils Absolute: 0 10*3/uL (ref 0.0–0.1)
Chloride: 107 meq/L (ref 96–112)
Creatinine, Ser: 0.74 mg/dL (ref 0.40–1.20)
Creatinine, Urine: 149.9 mg/dL
Eosinophils Absolute: 0.4 10*3/uL (ref 0.0–0.7)
Eosinophils Relative: 5 % (ref 0–5)
Glucose, Bld: 121 mg/dL — ABNORMAL HIGH (ref 70–99)
HCT: 39.3 % (ref 36.0–46.0)
HDL: 35 mg/dL — ABNORMAL LOW (ref >39)
Hemoglobin: 12.8 g/dL (ref 12.0–15.0)
Lymphocytes Relative: 37 % (ref 12–46)
Lymphs Abs: 2.7 10*3/uL (ref 0.7–4.0)
MCHC: 32.6 g/dL (ref 30.0–36.0)
MCV: 98.5 fL (ref 78.0–100.0)
Monocytes Absolute: 0.5 10*3/uL (ref 0.1–1.0)
Neutro Abs: 3.7 10*3/uL (ref 1.7–7.7)
Neutrophils Relative %: 50 % (ref 43–77)
Platelets: 262 10*3/uL (ref 150–400)
Sodium: 141 meq/L (ref 135–145)
TSH: 0.653 microintl units/mL (ref 0.350–4.50)
Total Bilirubin: 0.3 mg/dL (ref 0.3–1.2)
VLDL: 48 mg/dL — ABNORMAL HIGH (ref 0–40)
WBC: 7.4 10*3/uL (ref 4.0–10.5)

## 2008-06-10 ENCOUNTER — Ambulatory Visit: Payer: Self-pay | Admitting: Family Medicine

## 2008-06-29 ENCOUNTER — Ambulatory Visit: Payer: Self-pay | Admitting: Family Medicine

## 2008-07-01 ENCOUNTER — Ambulatory Visit: Payer: Self-pay | Admitting: Internal Medicine

## 2008-07-02 ENCOUNTER — Ambulatory Visit (HOSPITAL_COMMUNITY): Admission: RE | Admit: 2008-07-02 | Discharge: 2008-07-02 | Payer: Self-pay | Admitting: Internal Medicine

## 2008-07-02 ENCOUNTER — Encounter (INDEPENDENT_AMBULATORY_CARE_PROVIDER_SITE_OTHER): Payer: Self-pay | Admitting: Family Medicine

## 2008-07-02 ENCOUNTER — Ambulatory Visit: Payer: Self-pay | Admitting: Internal Medicine

## 2008-07-14 IMAGING — CR DG CHEST 2V
2 series · 2 of 2 positions shown · non-contrast
Comparison: none

HISTORY: Chest pain, COPD, smoker

CHEST 2 VIEWS:
Comparison 07/03/2005
Upper normal heart size.
Normal mediastinal contours and pulmonary vascularity.
Bronchitic changes with minimal chronic interstitial prominence in mid to lower
lungs.
No acute infiltrate, pleural effusion, or pneumothorax.
No acute bony abnormalities.

[view not recorded (1 of 2)]
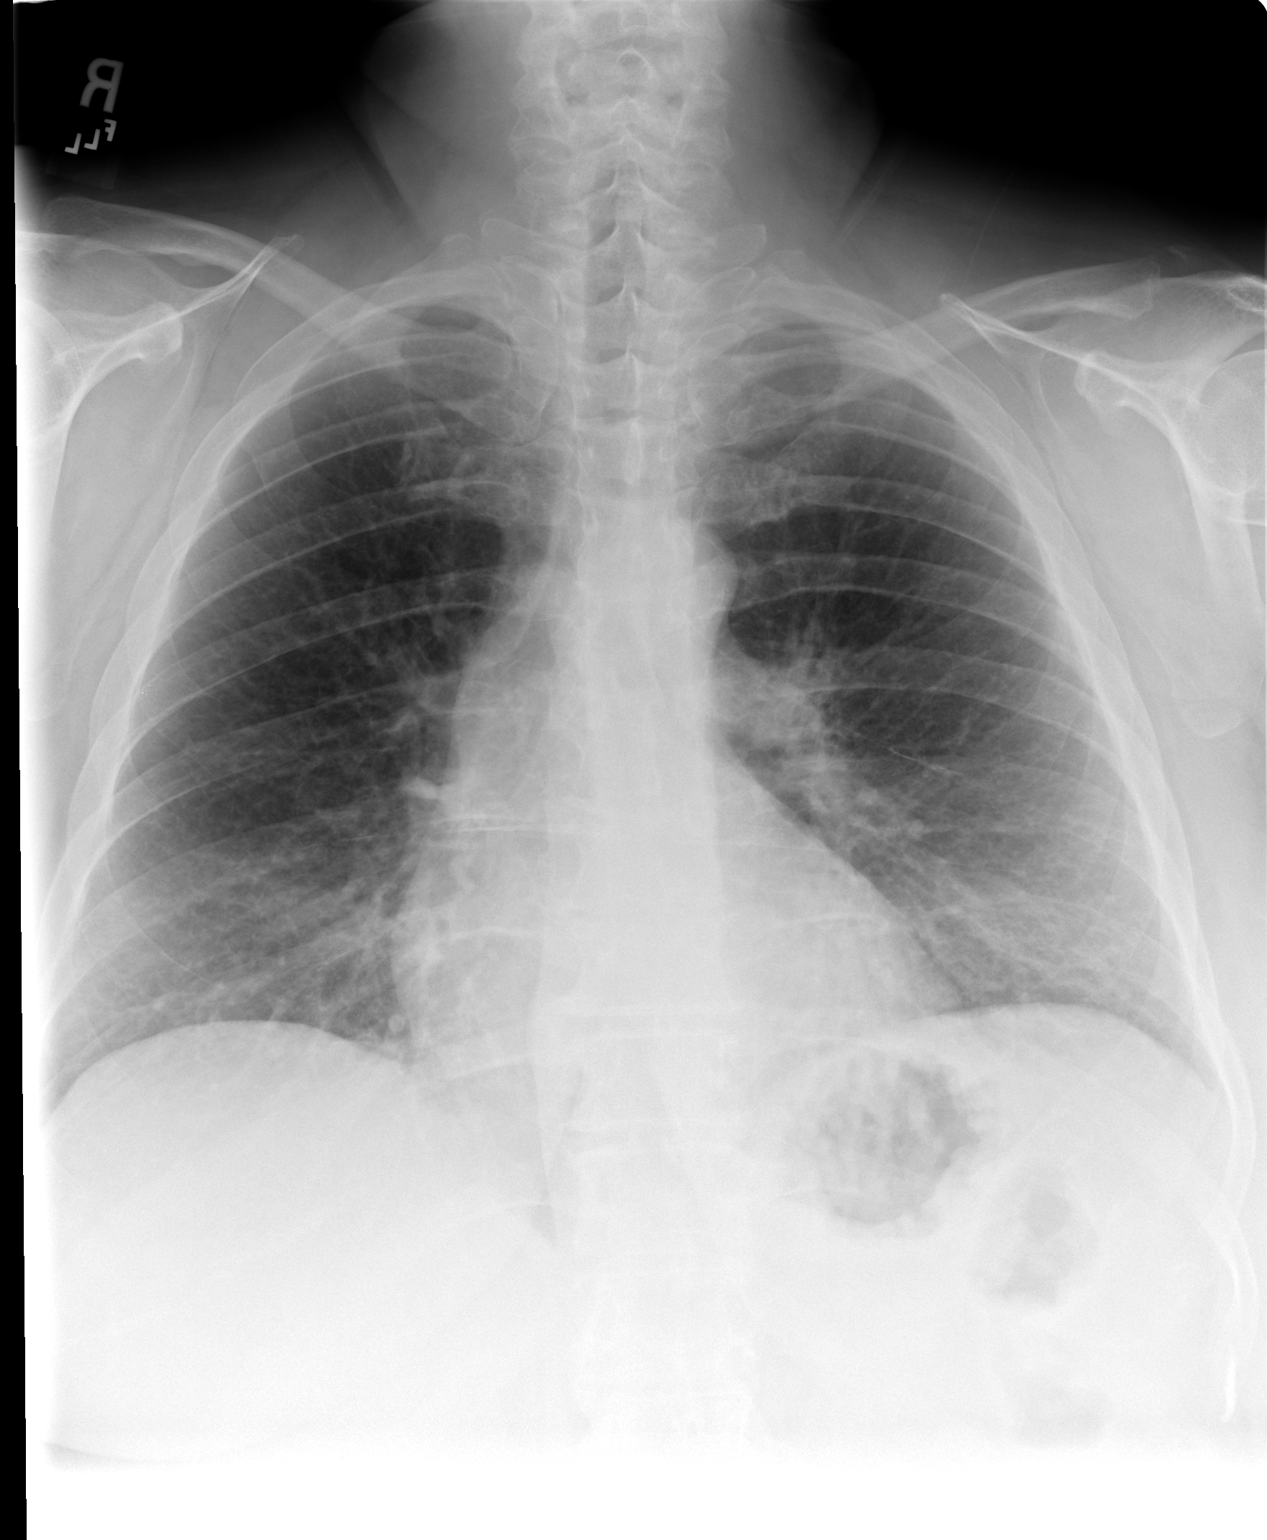

[view not recorded (2 of 2)]
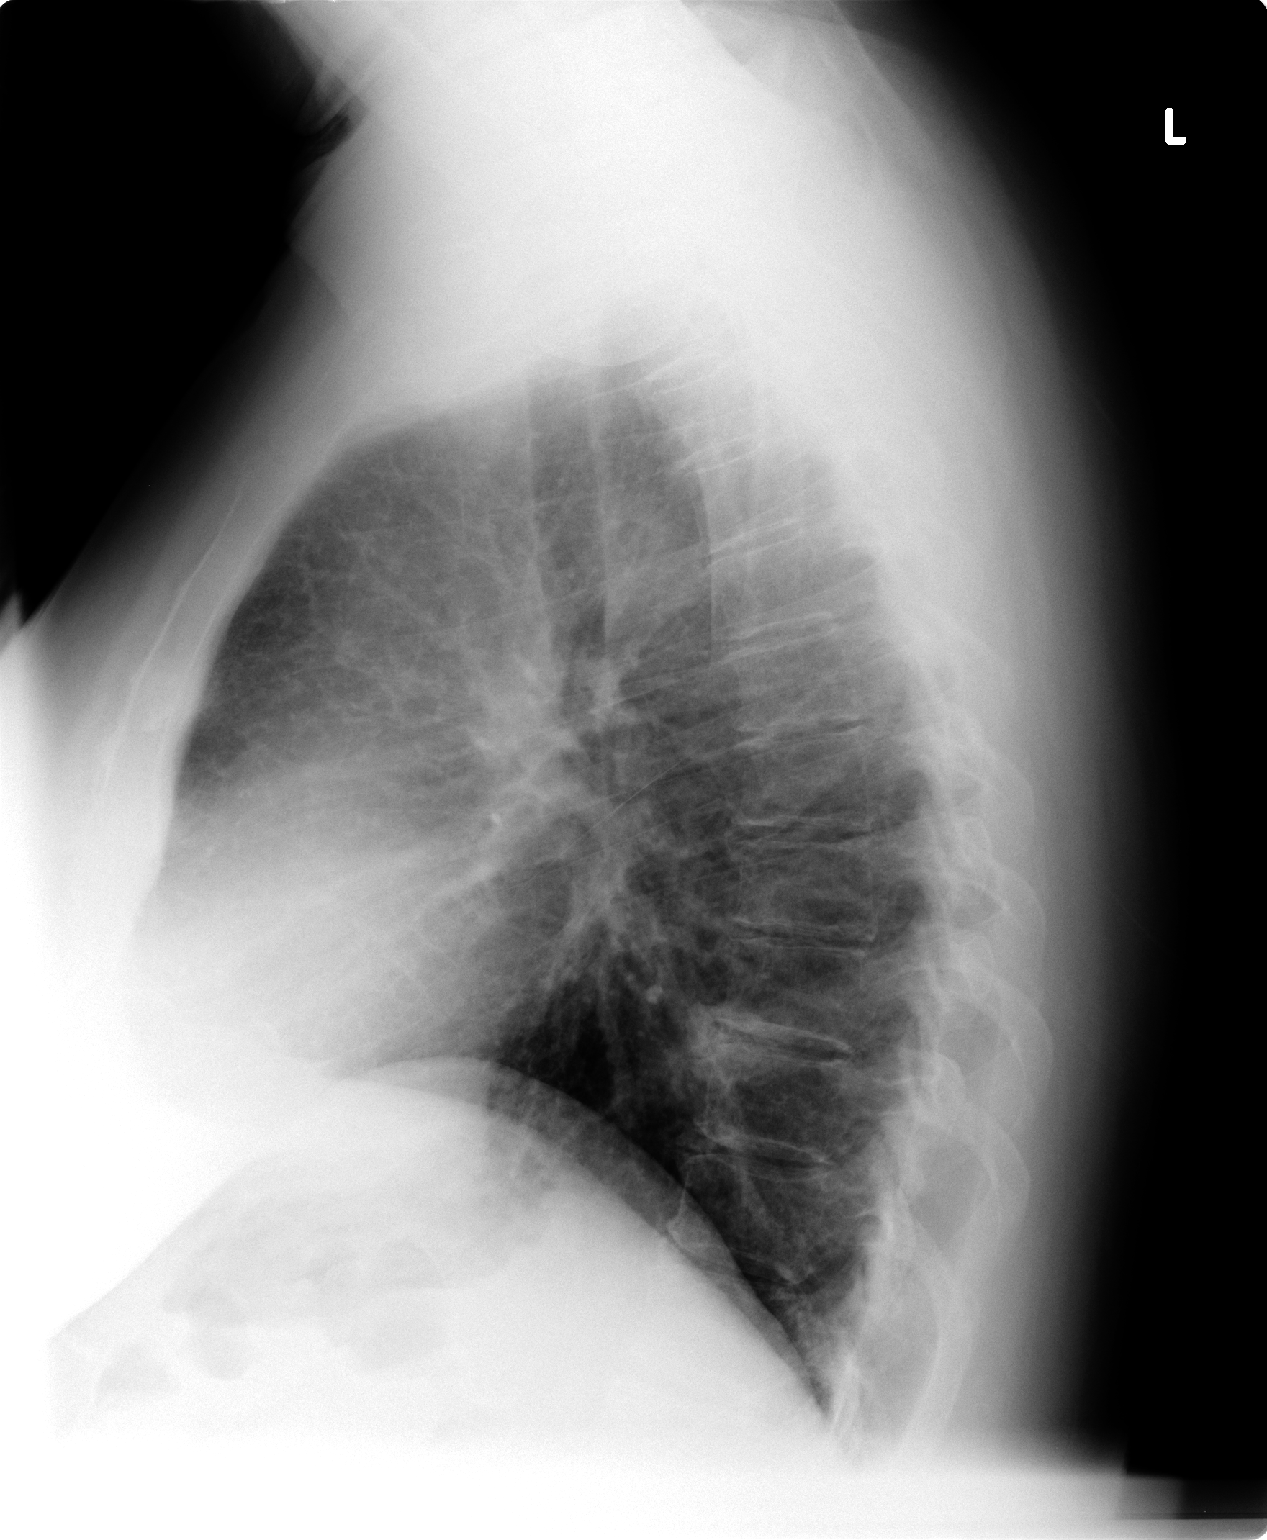

[2 of 2 positions shown; findings below may reference images not displayed]

IMPRESSION: Mild chronic bronchitic and interstitial lung disease changes.
No acute abnormalities.

## 2008-07-15 ENCOUNTER — Ambulatory Visit: Payer: Self-pay | Admitting: Family Medicine

## 2008-08-04 ENCOUNTER — Telehealth (INDEPENDENT_AMBULATORY_CARE_PROVIDER_SITE_OTHER): Payer: Self-pay | Admitting: Family Medicine

## 2008-08-04 ENCOUNTER — Ambulatory Visit: Payer: Self-pay | Admitting: Family Medicine

## 2008-08-04 DIAGNOSIS — M549 Dorsalgia, unspecified: Secondary | ICD-10-CM | POA: Insufficient documentation

## 2008-08-04 DIAGNOSIS — R197 Diarrhea, unspecified: Secondary | ICD-10-CM

## 2008-08-18 ENCOUNTER — Encounter (INDEPENDENT_AMBULATORY_CARE_PROVIDER_SITE_OTHER): Payer: Self-pay | Admitting: Family Medicine

## 2008-08-26 ENCOUNTER — Encounter (INDEPENDENT_AMBULATORY_CARE_PROVIDER_SITE_OTHER): Payer: Self-pay | Admitting: Family Medicine
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
## 2008-09-02 ENCOUNTER — Ambulatory Visit: Payer: Self-pay | Admitting: Family Medicine

## 2008-09-02 LAB — CONVERTED CEMR LAB: Hgb A1c MFr Bld: 6 %

## 2008-09-03 ENCOUNTER — Telehealth (INDEPENDENT_AMBULATORY_CARE_PROVIDER_SITE_OTHER): Payer: Self-pay | Admitting: *Deleted

## 2008-09-08 ENCOUNTER — Telehealth (INDEPENDENT_AMBULATORY_CARE_PROVIDER_SITE_OTHER): Payer: Self-pay | Admitting: *Deleted

## 2008-09-14 ENCOUNTER — Ambulatory Visit: Payer: Self-pay | Admitting: Family Medicine

## 2008-09-22 ENCOUNTER — Ambulatory Visit: Payer: Self-pay | Admitting: Family Medicine

## 2008-10-21 ENCOUNTER — Telehealth (INDEPENDENT_AMBULATORY_CARE_PROVIDER_SITE_OTHER): Payer: Self-pay | Admitting: *Deleted

## 2008-10-22 ENCOUNTER — Ambulatory Visit: Payer: Self-pay | Admitting: Family Medicine

## 2008-10-22 ENCOUNTER — Telehealth (INDEPENDENT_AMBULATORY_CARE_PROVIDER_SITE_OTHER): Payer: Self-pay | Admitting: *Deleted

## 2008-10-25 ENCOUNTER — Encounter (INDEPENDENT_AMBULATORY_CARE_PROVIDER_SITE_OTHER): Payer: Self-pay | Admitting: Family Medicine

## 2008-10-25 IMAGING — CT CT HEAD W/O CM
1 series · 16 of 30 positions shown, 20 images · non-contrast
Comparison: 08/06/2005

CLINICAL DATA: Frontal headaches, history diabetes and migraines

CT HEAD WITHOUT CONTRAST
TECHNIQUE: Contiguous axial images were obtained from the base of
the skull through the vertex without contrast.

[Series 2: headseq 4.8 h37s · axial · 0.48mm/px · z∈[+116,+279]mm · 16 of 36 slices shown, 20 images]
[im 2/36  brain]
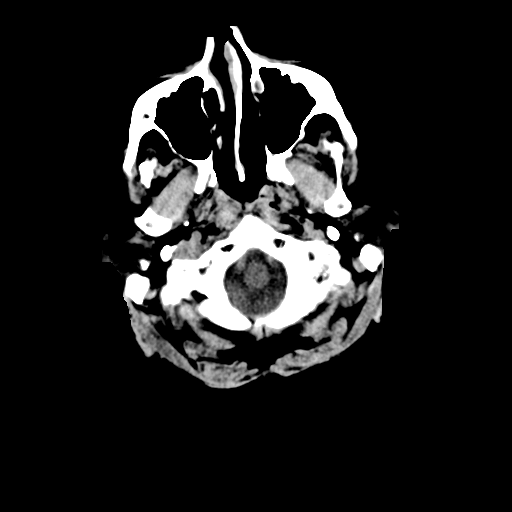
[im 2/36  bone]
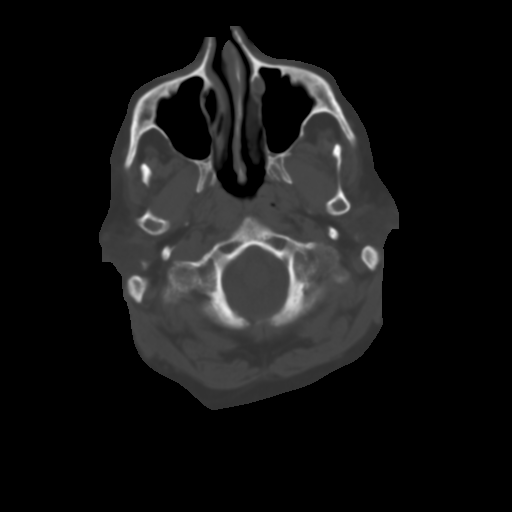
[im 4/36  brain]
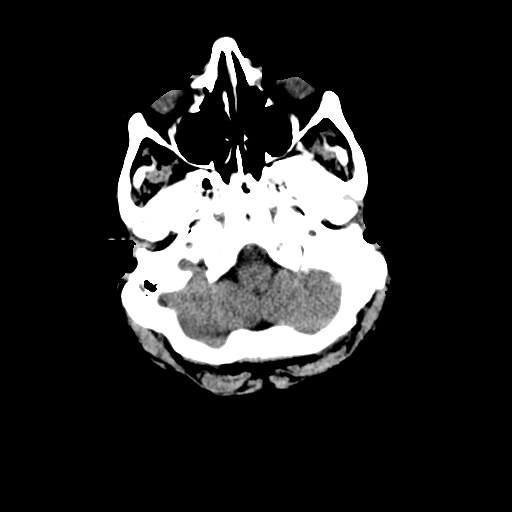
[im 7/36  brain]
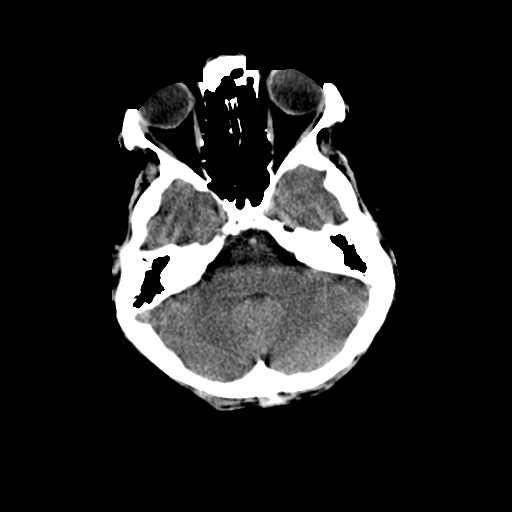
[im 9/36  brain]
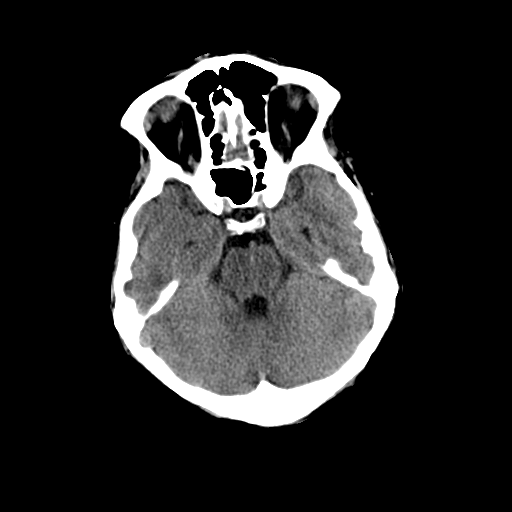
[im 10/36  brain]
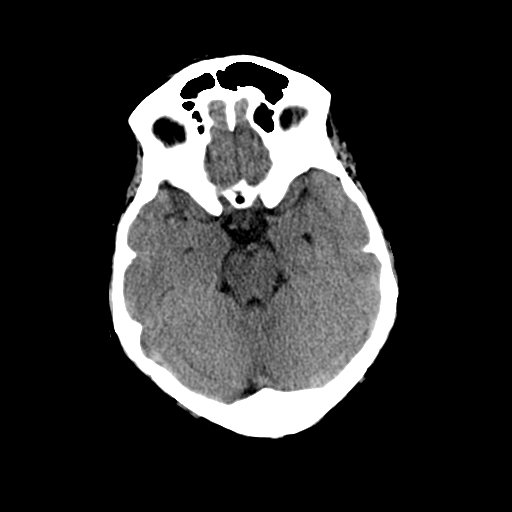
[im 10/36  bone]
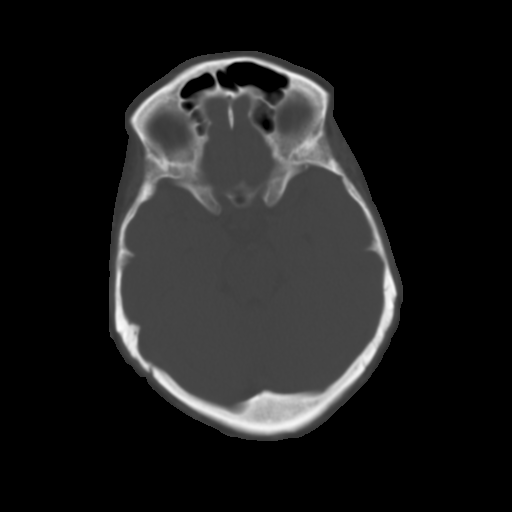
[im 13/36  brain]
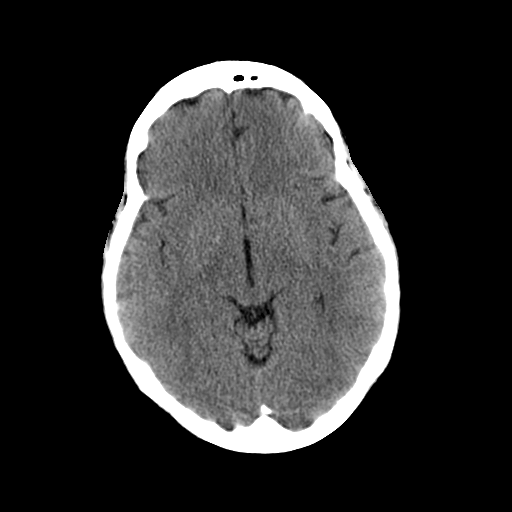
[im 15/36  brain]
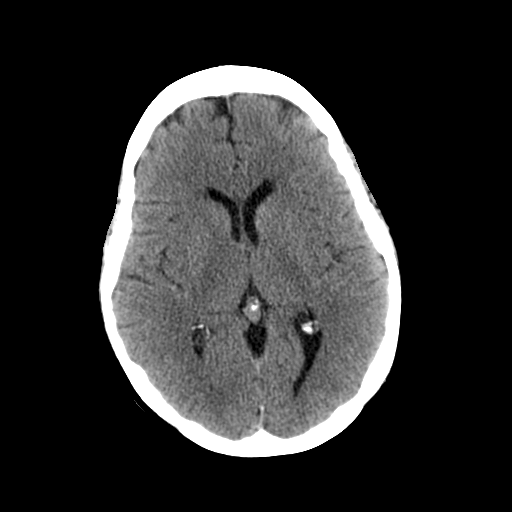
[im 17/36  brain]
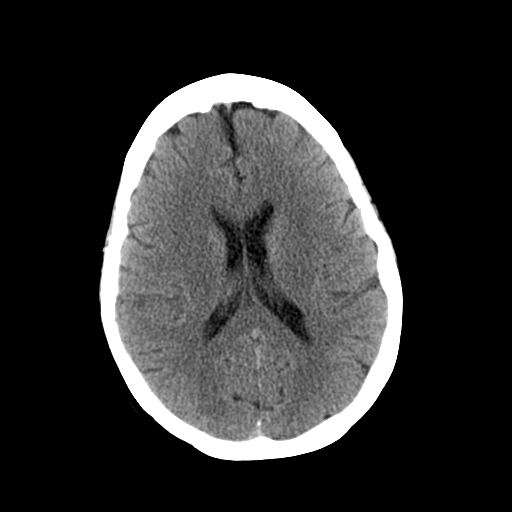
[im 19/36  brain]
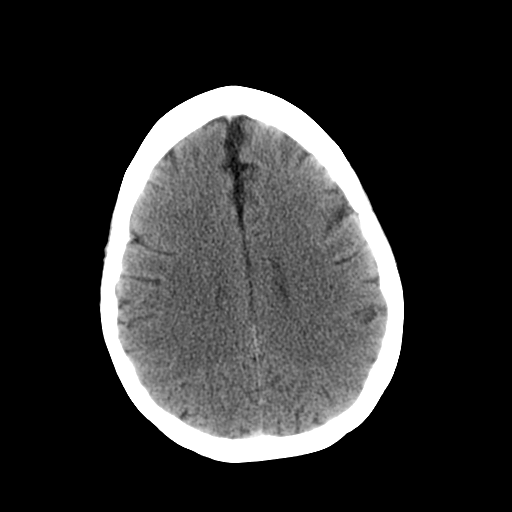
[im 19/36  bone]
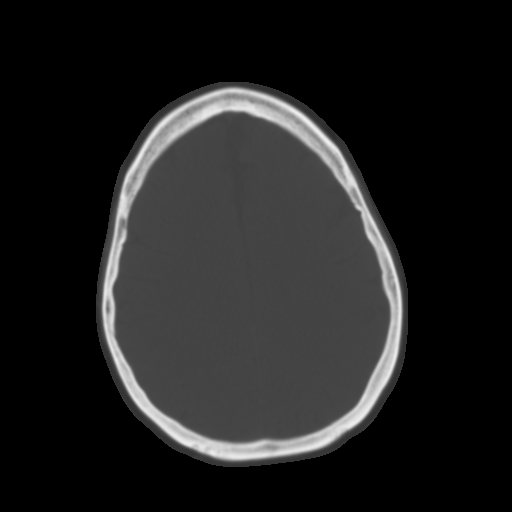
[im 21/36  brain]
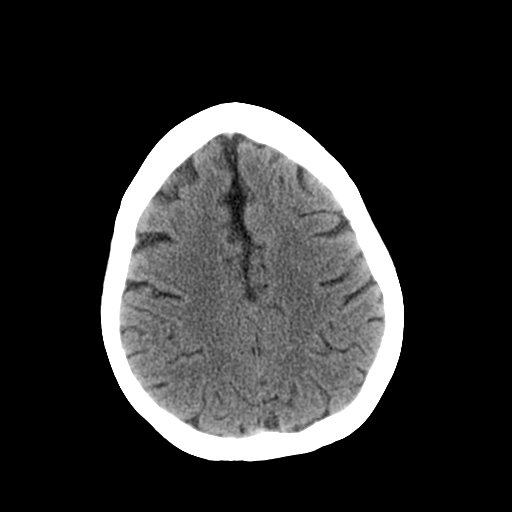
[im 23/36  brain]
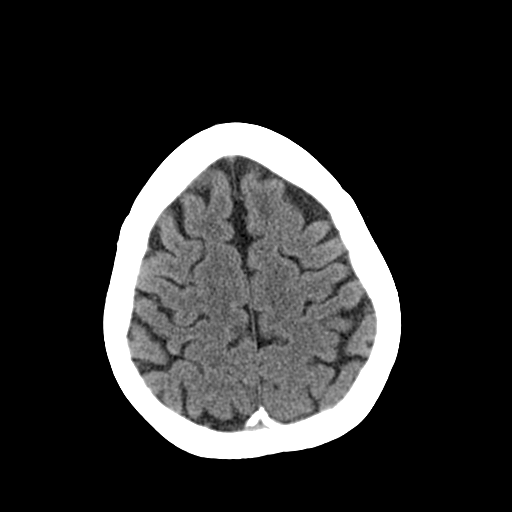
[im 26/36  brain]
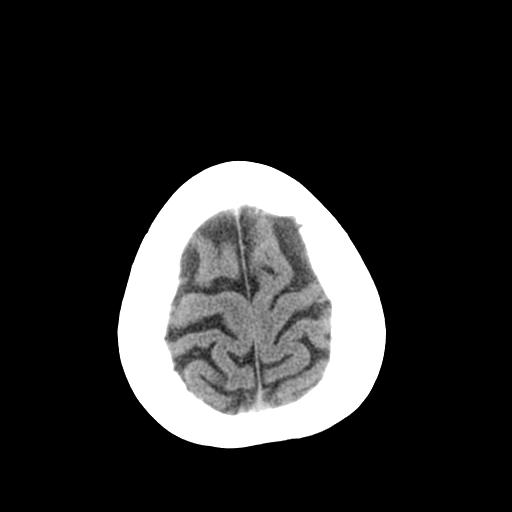
[im 27/36  brain]
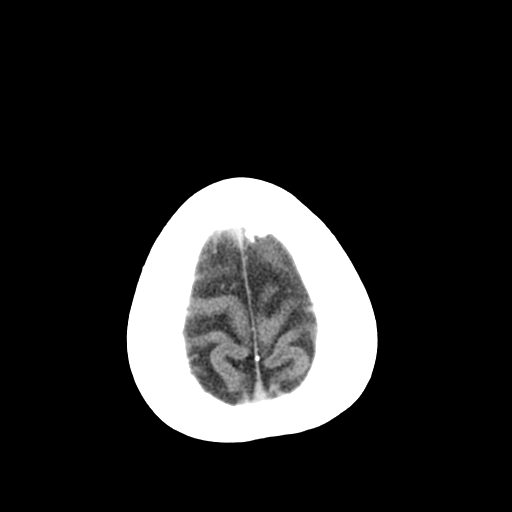
[im 27/36  bone]
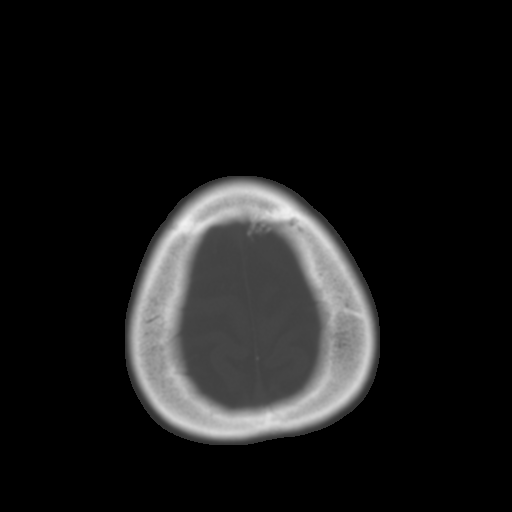
[im 29/36  brain]
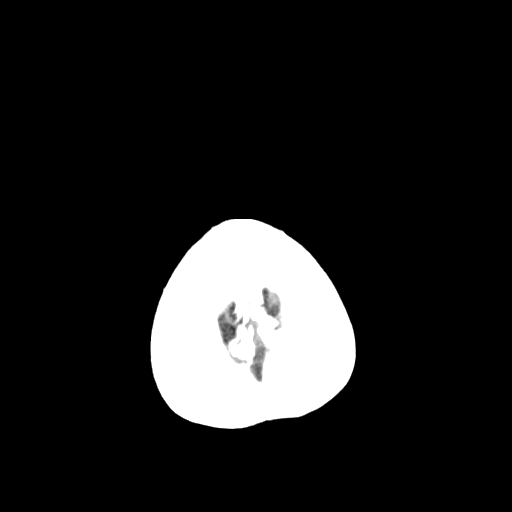
[im 32/36  brain]
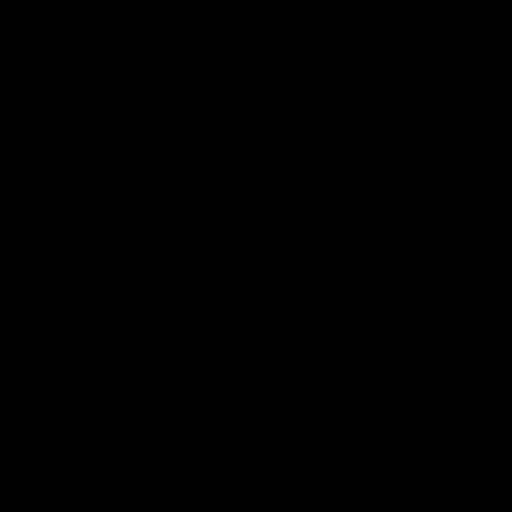
[im 34/36  brain]
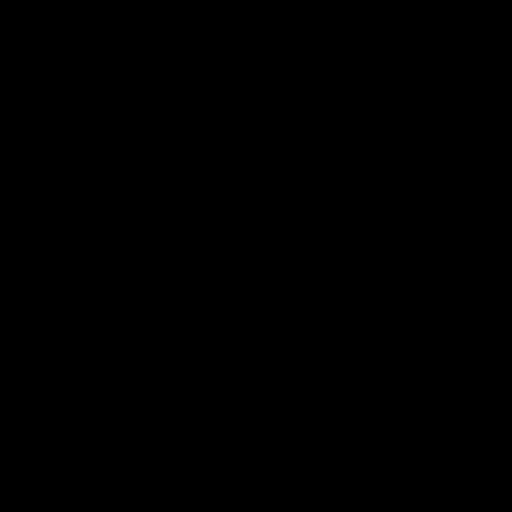

[16 of 30 positions shown; findings below may reference images not displayed]

FINDINGS: Normal ventricular morphology.
No midline shift or mass effect.
Normal appearance of brain parenchyma.
No intracranial hemorrhage, mass lesion, or acute infarct.
Visualized paranasal sinuses and mastoid air cells clear.
Bones unremarkable.
IMPRESSION: No acute intracranial abnormalities.

## 2008-10-26 ENCOUNTER — Ambulatory Visit (HOSPITAL_COMMUNITY): Admission: RE | Admit: 2008-10-26 | Discharge: 2008-10-26 | Payer: Self-pay | Admitting: Family Medicine

## 2008-10-26 ENCOUNTER — Telehealth (INDEPENDENT_AMBULATORY_CARE_PROVIDER_SITE_OTHER): Payer: Self-pay | Admitting: *Deleted

## 2008-10-26 ENCOUNTER — Ambulatory Visit: Payer: Self-pay | Admitting: Family Medicine

## 2008-10-27 ENCOUNTER — Encounter (INDEPENDENT_AMBULATORY_CARE_PROVIDER_SITE_OTHER): Payer: Self-pay | Admitting: Family Medicine

## 2008-10-27 ENCOUNTER — Telehealth (INDEPENDENT_AMBULATORY_CARE_PROVIDER_SITE_OTHER): Payer: Self-pay | Admitting: *Deleted

## 2008-10-27 LAB — CONVERTED CEMR LAB
Eosinophils Relative: 3 % (ref 0–5)
HCT: 41.9 % (ref 36.0–46.0)
Hemoglobin: 13.4 g/dL (ref 12.0–15.0)
Lymphocytes Relative: 37 % (ref 12–46)
Lymphs Abs: 4.7 10*3/uL — ABNORMAL HIGH (ref 0.7–4.0)
Monocytes Absolute: 0.9 10*3/uL (ref 0.1–1.0)
Monocytes Relative: 8 % (ref 3–12)
RBC: 4.18 M/uL (ref 3.87–5.11)
WBC: 12.5 10*3/uL — ABNORMAL HIGH (ref 4.0–10.5)

## 2008-10-28 ENCOUNTER — Encounter (INDEPENDENT_AMBULATORY_CARE_PROVIDER_SITE_OTHER): Payer: Self-pay | Admitting: Family Medicine

## 2008-10-29 ENCOUNTER — Ambulatory Visit: Payer: Self-pay | Admitting: Family Medicine

## 2008-11-08 ENCOUNTER — Encounter (INDEPENDENT_AMBULATORY_CARE_PROVIDER_SITE_OTHER): Payer: Self-pay | Admitting: Family Medicine

## 2008-12-08 ENCOUNTER — Ambulatory Visit: Payer: Self-pay | Admitting: Family Medicine

## 2008-12-13 ENCOUNTER — Ambulatory Visit (HOSPITAL_COMMUNITY): Admission: RE | Admit: 2008-12-13 | Discharge: 2008-12-13 | Payer: Self-pay | Admitting: Family Medicine

## 2009-01-05 ENCOUNTER — Ambulatory Visit: Payer: Self-pay | Admitting: Internal Medicine

## 2009-01-05 ENCOUNTER — Ambulatory Visit: Payer: Self-pay | Admitting: Family Medicine

## 2009-01-05 DIAGNOSIS — R1319 Other dysphagia: Secondary | ICD-10-CM

## 2009-01-05 DIAGNOSIS — Z8719 Personal history of other diseases of the digestive system: Secondary | ICD-10-CM

## 2009-01-05 DIAGNOSIS — Z860100 Personal history of colon polyps, unspecified: Secondary | ICD-10-CM | POA: Insufficient documentation

## 2009-01-05 HISTORY — DX: Personal history of colonic polyps: Z86.010

## 2009-01-05 HISTORY — DX: Personal history of colon polyps, unspecified: Z86.0100

## 2009-01-05 HISTORY — DX: Other dysphagia: R13.19

## 2009-01-05 HISTORY — DX: Personal history of other diseases of the digestive system: Z87.19

## 2009-01-05 LAB — CONVERTED CEMR LAB: Glucose, Bld: 119 mg/dL

## 2009-01-05 IMAGING — US US EXTREM LOW VENOUS*R*
1 series · 14 of 24 positions shown · non-contrast
Comparison: None

CLINICAL DATA: Right calf pain, question DVT



[Series 1: unknown · 14 of 34 slices shown]
[im 1/34]
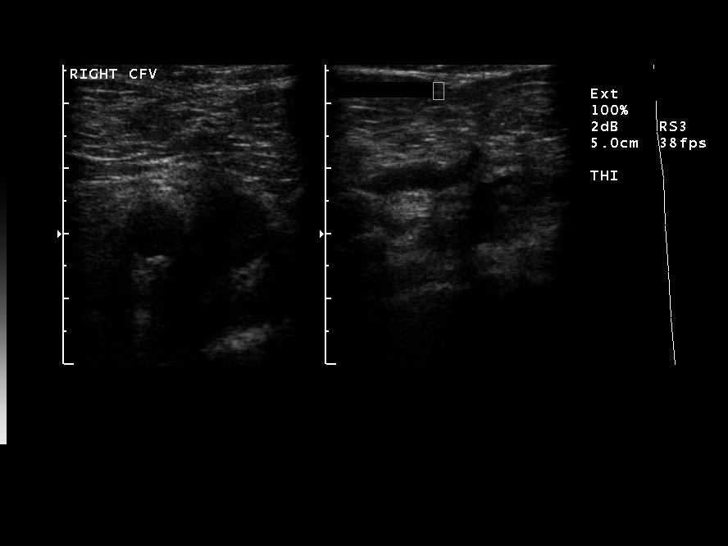
[im 3/34]
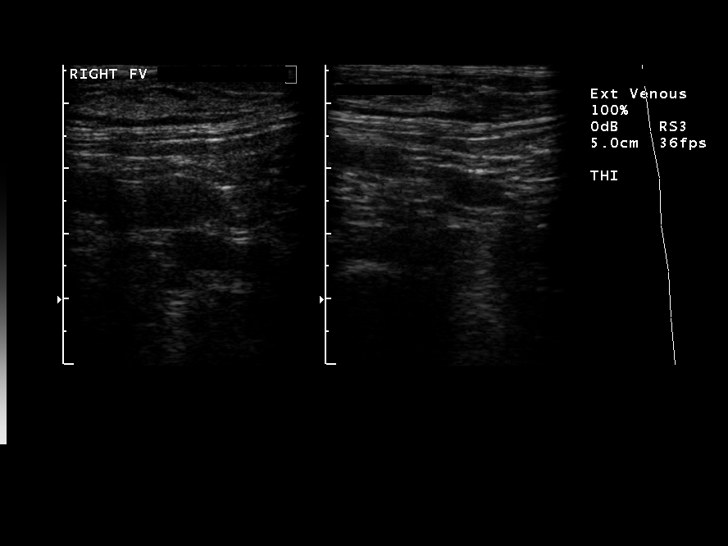
[im 6/34]
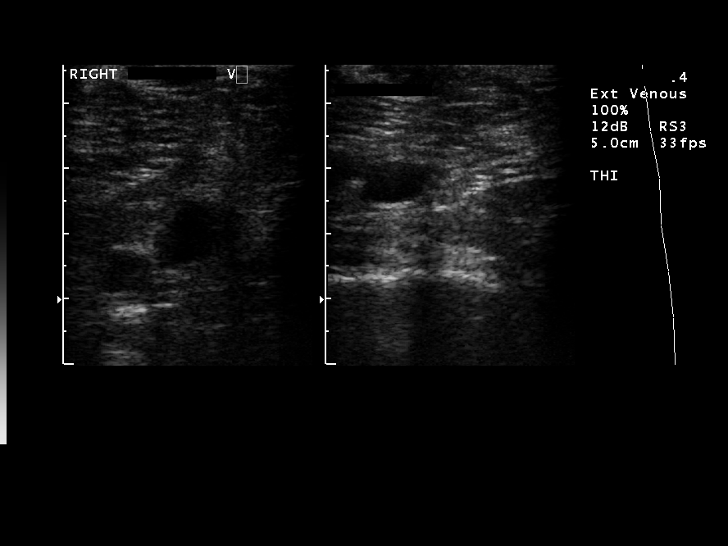
[im 9/34]
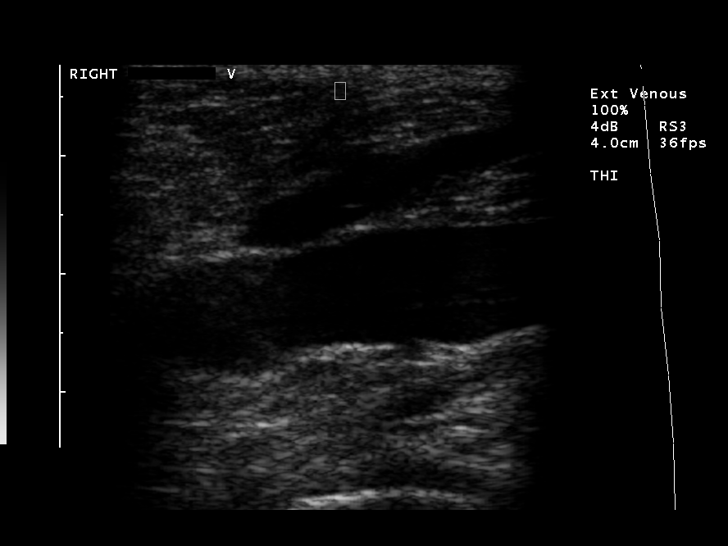
[im 11/34]
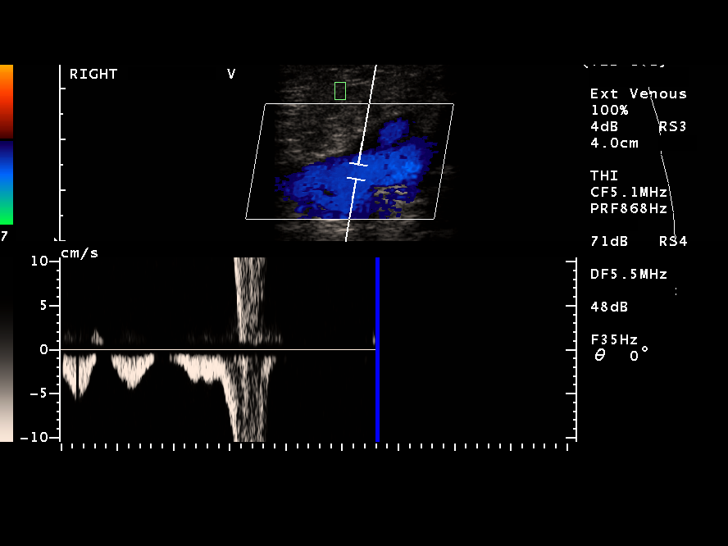
[im 13/34]
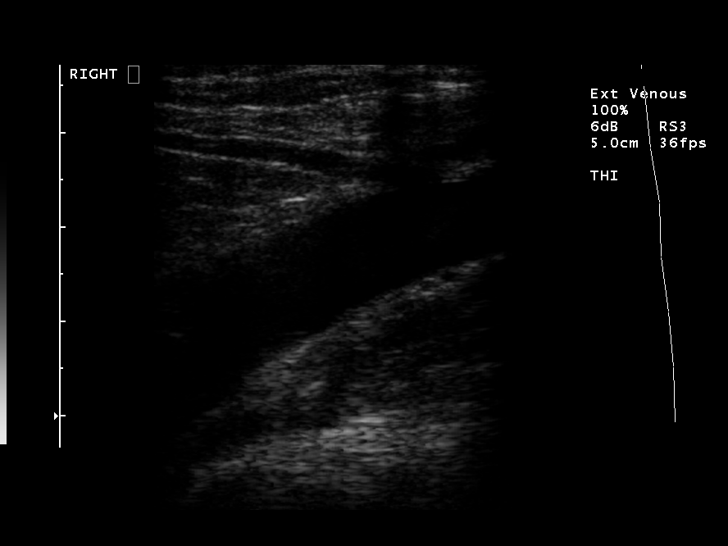
[im 16/34]
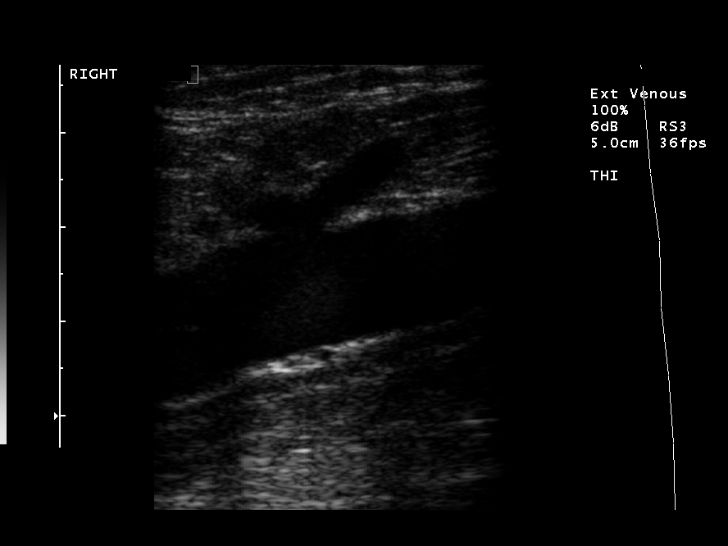
[im 18/34]
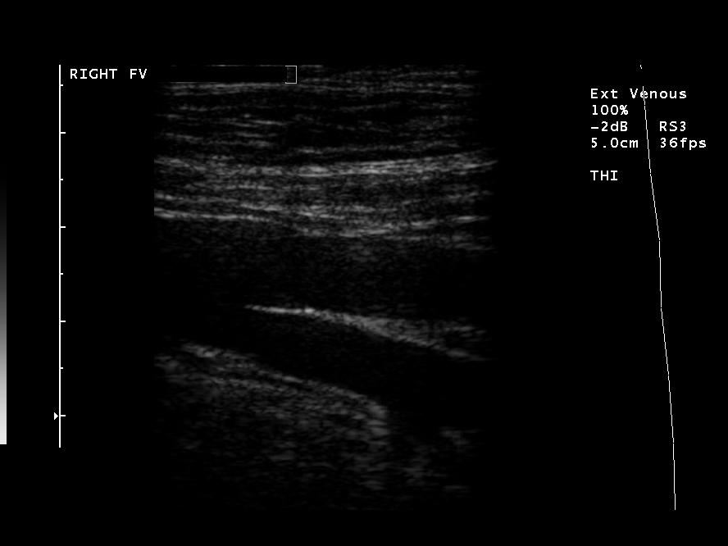
[im 21/34]
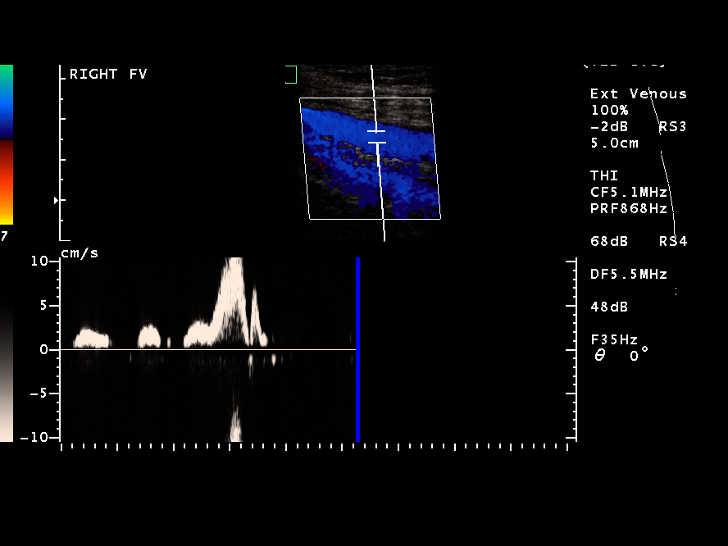
[im 23/34]
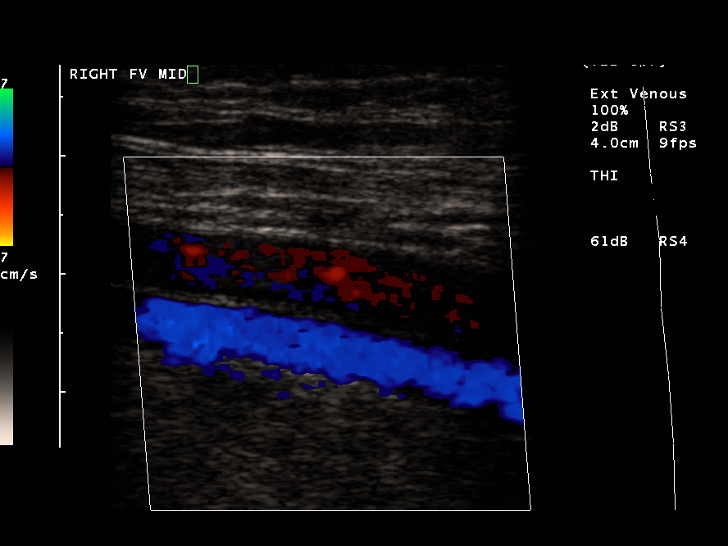
[im 26/34]
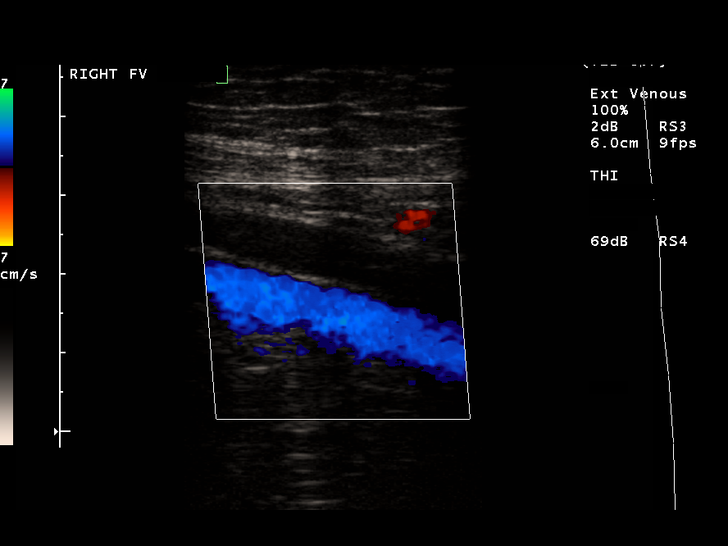
[im 28/34]
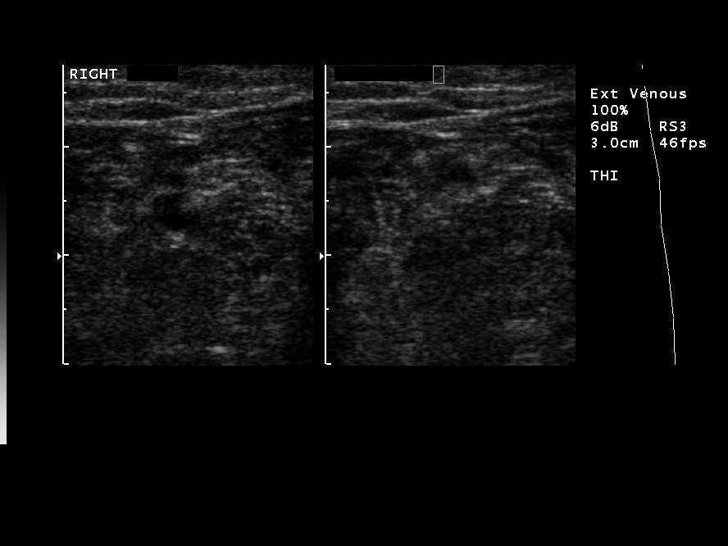
[im 31/34]
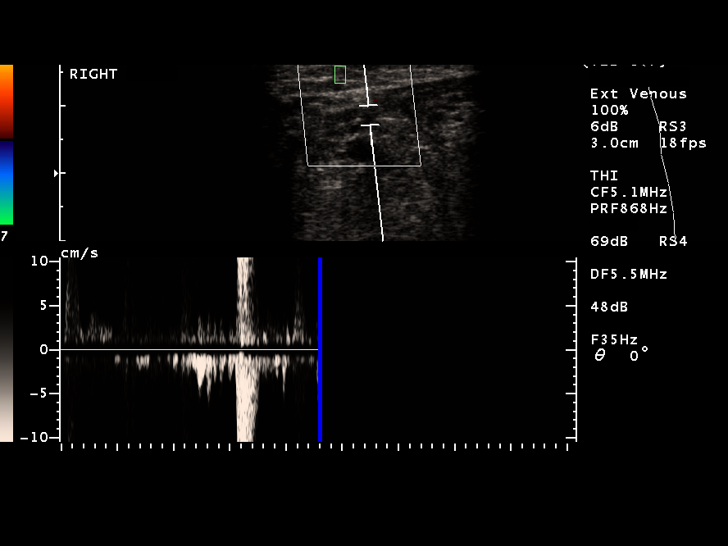
[im 34/34]
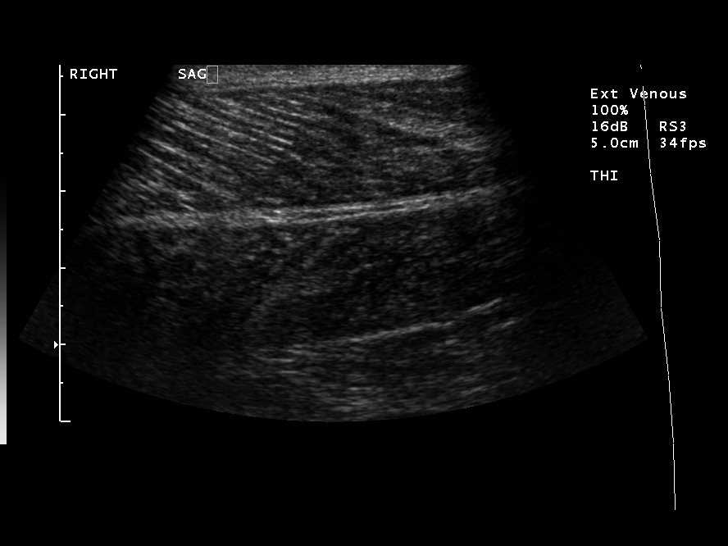

[14 of 24 positions shown; findings below may reference images not displayed]

FINDINGS: Deep venous system patent and compressible from right groin through
popliteal fossa.
Spontaneous venous flow present with intact augmentation and
evidence of respiratory phasicity.
No intraluminal thrombus identified.
Visualized portion of the greater saphenous system unremarkable.
IMPRESSION: No evidence of deep venous thrombosis, right lower extremity.

## 2009-01-06 ENCOUNTER — Encounter: Payer: Self-pay | Admitting: Internal Medicine

## 2009-01-12 ENCOUNTER — Ambulatory Visit: Payer: Self-pay | Admitting: Family Medicine

## 2009-01-13 HISTORY — PX: COLONOSCOPY: SHX174

## 2009-01-17 ENCOUNTER — Encounter (INDEPENDENT_AMBULATORY_CARE_PROVIDER_SITE_OTHER): Payer: Self-pay | Admitting: Family Medicine

## 2009-01-19 ENCOUNTER — Ambulatory Visit: Payer: Self-pay | Admitting: Family Medicine

## 2009-01-21 ENCOUNTER — Telehealth (INDEPENDENT_AMBULATORY_CARE_PROVIDER_SITE_OTHER): Payer: Self-pay | Admitting: *Deleted

## 2009-01-21 ENCOUNTER — Ambulatory Visit: Payer: Self-pay | Admitting: Family Medicine

## 2009-01-31 ENCOUNTER — Telehealth (INDEPENDENT_AMBULATORY_CARE_PROVIDER_SITE_OTHER): Payer: Self-pay | Admitting: *Deleted

## 2009-02-01 ENCOUNTER — Ambulatory Visit: Payer: Self-pay | Admitting: Family Medicine

## 2009-02-02 ENCOUNTER — Encounter (INDEPENDENT_AMBULATORY_CARE_PROVIDER_SITE_OTHER): Payer: Self-pay | Admitting: Family Medicine

## 2009-02-02 LAB — CONVERTED CEMR LAB
BUN: 10 mg/dL (ref 6–23)
CO2: 24 meq/L (ref 19–32)
Chloride: 107 meq/L (ref 96–112)
Creatinine, Ser: 0.75 mg/dL (ref 0.40–1.20)

## 2009-02-03 ENCOUNTER — Ambulatory Visit: Payer: Self-pay | Admitting: Internal Medicine

## 2009-02-03 ENCOUNTER — Ambulatory Visit (HOSPITAL_COMMUNITY): Admission: RE | Admit: 2009-02-03 | Discharge: 2009-02-03 | Payer: Self-pay | Admitting: Internal Medicine

## 2009-02-04 ENCOUNTER — Encounter (INDEPENDENT_AMBULATORY_CARE_PROVIDER_SITE_OTHER): Payer: Self-pay | Admitting: Family Medicine

## 2009-02-04 ENCOUNTER — Ambulatory Visit (HOSPITAL_COMMUNITY): Admission: RE | Admit: 2009-02-04 | Discharge: 2009-02-04 | Payer: Self-pay | Admitting: Family Medicine

## 2009-02-04 ENCOUNTER — Ambulatory Visit: Payer: Self-pay | Admitting: Cardiology

## 2009-02-08 ENCOUNTER — Ambulatory Visit: Payer: Self-pay | Admitting: Family Medicine

## 2009-03-04 ENCOUNTER — Encounter
Admission: RE | Admit: 2009-03-04 | Discharge: 2009-03-04 | Payer: Self-pay | Admitting: Physical Medicine & Rehabilitation

## 2009-03-07 ENCOUNTER — Encounter (INDEPENDENT_AMBULATORY_CARE_PROVIDER_SITE_OTHER): Payer: Self-pay | Admitting: Family Medicine

## 2009-03-21 ENCOUNTER — Telehealth (INDEPENDENT_AMBULATORY_CARE_PROVIDER_SITE_OTHER): Payer: Self-pay | Admitting: *Deleted

## 2009-03-22 ENCOUNTER — Telehealth (INDEPENDENT_AMBULATORY_CARE_PROVIDER_SITE_OTHER): Payer: Self-pay | Admitting: *Deleted

## 2009-04-20 ENCOUNTER — Ambulatory Visit: Payer: Self-pay | Admitting: Family Medicine

## 2009-04-20 DIAGNOSIS — L570 Actinic keratosis: Secondary | ICD-10-CM

## 2009-04-20 LAB — CONVERTED CEMR LAB: Hgb A1c MFr Bld: 6.7 %

## 2009-04-25 ENCOUNTER — Encounter (INDEPENDENT_AMBULATORY_CARE_PROVIDER_SITE_OTHER): Payer: Self-pay | Admitting: Family Medicine

## 2009-05-30 ENCOUNTER — Encounter (INDEPENDENT_AMBULATORY_CARE_PROVIDER_SITE_OTHER): Payer: Self-pay | Admitting: Family Medicine

## 2009-06-01 ENCOUNTER — Ambulatory Visit: Payer: Self-pay | Admitting: Family Medicine

## 2009-06-01 DIAGNOSIS — R809 Proteinuria, unspecified: Secondary | ICD-10-CM | POA: Insufficient documentation

## 2009-06-01 LAB — CONVERTED CEMR LAB
ALT: 17 units/L (ref 0–35)
AST: 16 units/L (ref 0–37)
Albumin: 4.1 g/dL (ref 3.5–5.2)
BUN: 15 mg/dL (ref 6–23)
Calcium: 9.2 mg/dL (ref 8.4–10.5)
Chloride: 110 meq/L (ref 96–112)
Eosinophils Relative: 6 % — ABNORMAL HIGH (ref 0–5)
HCT: 41.2 % (ref 36.0–46.0)
Lymphocytes Relative: 43 % (ref 12–46)
Lymphs Abs: 3.3 10*3/uL (ref 0.7–4.0)
Microalb Creat Ratio: 84.8 mg/g — ABNORMAL HIGH (ref 0.0–30.0)
Platelets: 273 10*3/uL (ref 150–400)
Potassium: 5.1 meq/L (ref 3.5–5.3)
Sodium: 141 meq/L (ref 135–145)
TSH: 22.61 microintl units/mL — ABNORMAL HIGH (ref 0.350–4.500)
Total Protein: 6.7 g/dL (ref 6.0–8.3)
WBC: 7.7 10*3/uL (ref 4.0–10.5)

## 2009-06-06 ENCOUNTER — Encounter: Admission: RE | Admit: 2009-06-06 | Discharge: 2009-06-06 | Payer: Self-pay | Admitting: Family Medicine

## 2009-06-24 ENCOUNTER — Encounter (INDEPENDENT_AMBULATORY_CARE_PROVIDER_SITE_OTHER): Payer: Self-pay | Admitting: Family Medicine

## 2009-06-27 ENCOUNTER — Telehealth (INDEPENDENT_AMBULATORY_CARE_PROVIDER_SITE_OTHER): Payer: Self-pay | Admitting: *Deleted

## 2009-07-04 ENCOUNTER — Ambulatory Visit: Payer: Self-pay | Admitting: Family Medicine

## 2009-07-11 IMAGING — CR DG CHEST 2V
2 series · 2 of 2 positions shown · non-contrast
Comparison: 10/30/2007

CLINICAL DATA: COPD

CHEST - 2 VIEW

[view not recorded (1 of 2)]
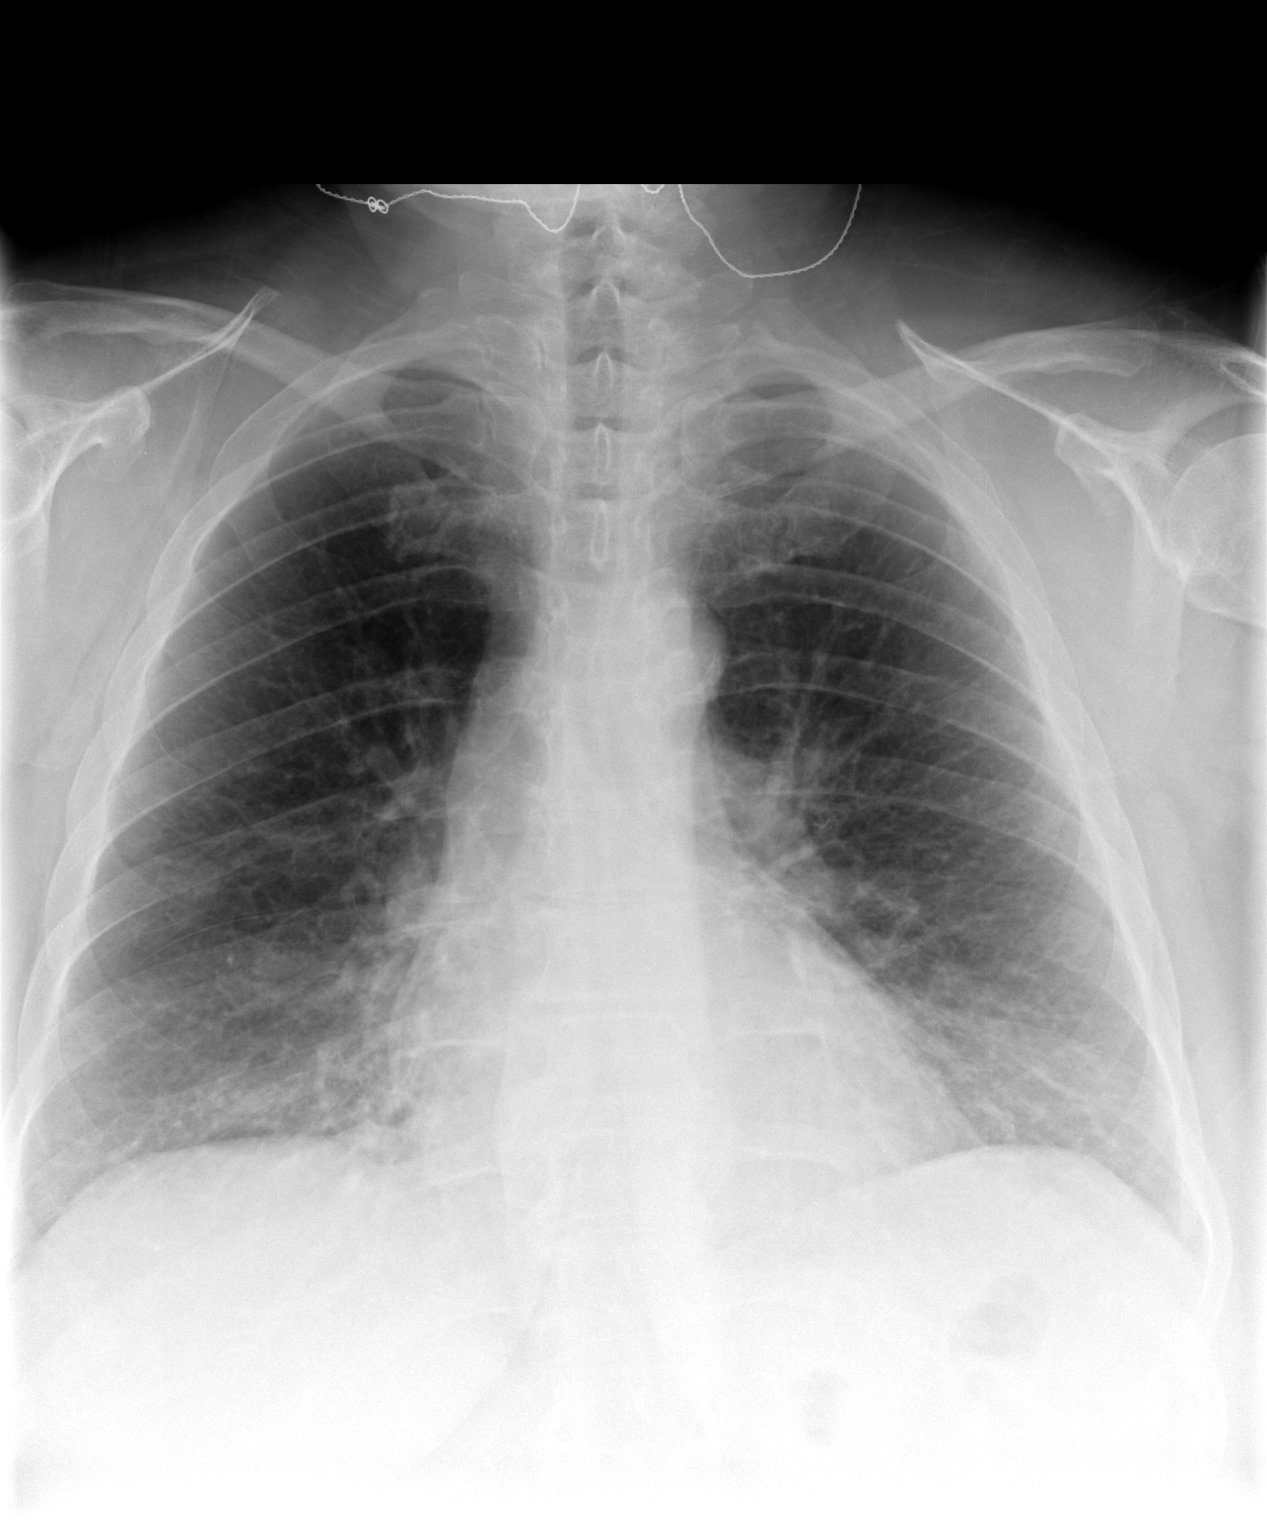

[view not recorded (2 of 2)]
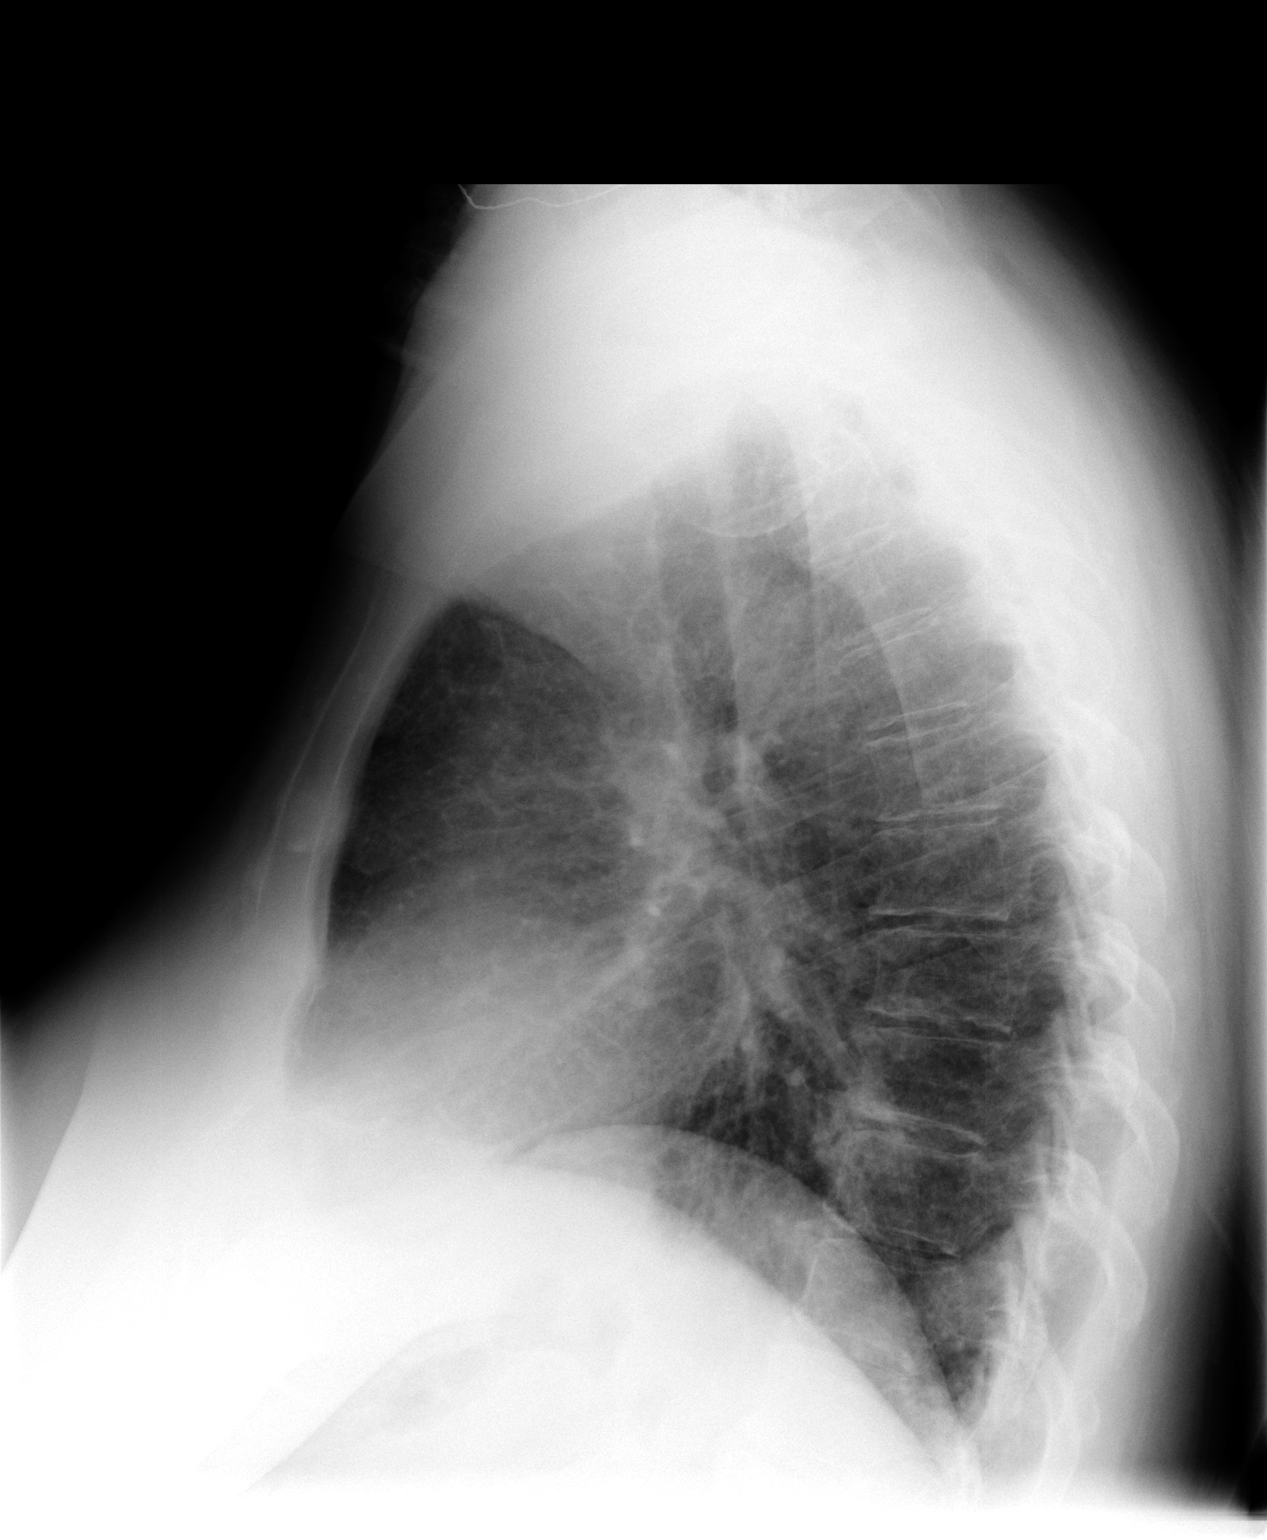

[2 of 2 positions shown; findings below may reference images not displayed]

FINDINGS: There is cardiac enlargement.

No pleural effusion or pulmonary edema

There is interstitial change consistent with COPD.

No superimposed airspace densities identified.
IMPRESSION: 1.  COPD.
2.  No evidence for pneumonia.

## 2009-10-26 ENCOUNTER — Ambulatory Visit (HOSPITAL_COMMUNITY): Admission: RE | Admit: 2009-10-26 | Discharge: 2009-10-26 | Payer: Self-pay | Admitting: Orthopaedic Surgery

## 2009-11-18 ENCOUNTER — Telehealth (INDEPENDENT_AMBULATORY_CARE_PROVIDER_SITE_OTHER): Payer: Self-pay

## 2010-01-18 ENCOUNTER — Encounter (HOSPITAL_COMMUNITY): Admission: RE | Admit: 2010-01-18 | Discharge: 2010-02-17 | Payer: Self-pay | Admitting: Oncology

## 2010-01-18 ENCOUNTER — Ambulatory Visit (HOSPITAL_COMMUNITY): Payer: Self-pay | Admitting: Oncology

## 2010-03-16 ENCOUNTER — Ambulatory Visit (HOSPITAL_COMMUNITY): Payer: Self-pay | Admitting: Psychiatry

## 2010-03-22 ENCOUNTER — Emergency Department (HOSPITAL_COMMUNITY): Admission: EM | Admit: 2010-03-22 | Discharge: 2010-03-22 | Payer: Self-pay | Admitting: Emergency Medicine

## 2010-03-22 ENCOUNTER — Encounter (INDEPENDENT_AMBULATORY_CARE_PROVIDER_SITE_OTHER): Payer: Self-pay | Admitting: *Deleted

## 2010-03-22 ENCOUNTER — Telehealth (INDEPENDENT_AMBULATORY_CARE_PROVIDER_SITE_OTHER): Payer: Self-pay | Admitting: *Deleted

## 2010-03-22 LAB — CONVERTED CEMR LAB
BUN: 6 mg/dL
Basophils Relative: 1 %
Calcium: 9.3 mg/dL
Chloride: 106 meq/L
Creatinine, Ser: 0.74 mg/dL
Eosinophils Absolute: 0.2 10*3/uL
Glomerular Filtration Rate, Af Am: >60 mL/min/{1.73_m2}
Glucose, Bld: 95 mg/dL
HCT: 34 %
Hemoglobin: 11.6 g/dL
Lymphs Abs: 2.9 10*3/uL
Monocytes Absolute: 0.6 10*3/uL
Monocytes Relative: 10 %
WBC: 6.2 10*3/uL

## 2010-04-06 ENCOUNTER — Ambulatory Visit (HOSPITAL_COMMUNITY): Payer: Self-pay | Admitting: Psychiatry

## 2010-05-02 ENCOUNTER — Ambulatory Visit (HOSPITAL_COMMUNITY): Payer: Self-pay | Admitting: Psychiatry

## 2010-05-04 ENCOUNTER — Ambulatory Visit: Payer: Self-pay | Admitting: Internal Medicine

## 2010-05-10 ENCOUNTER — Encounter: Payer: Self-pay | Admitting: Internal Medicine

## 2010-05-15 HISTORY — PX: ESOPHAGOGASTRODUODENOSCOPY: SHX1529

## 2010-05-17 ENCOUNTER — Ambulatory Visit (HOSPITAL_COMMUNITY): Admission: RE | Admit: 2010-05-17 | Discharge: 2010-05-17 | Payer: Self-pay | Admitting: Internal Medicine

## 2010-05-17 ENCOUNTER — Ambulatory Visit: Payer: Self-pay | Admitting: Internal Medicine

## 2010-06-14 ENCOUNTER — Encounter (INDEPENDENT_AMBULATORY_CARE_PROVIDER_SITE_OTHER): Payer: Self-pay | Admitting: *Deleted

## 2010-06-16 ENCOUNTER — Encounter (INDEPENDENT_AMBULATORY_CARE_PROVIDER_SITE_OTHER): Payer: Self-pay | Admitting: *Deleted

## 2010-06-16 DIAGNOSIS — I2510 Atherosclerotic heart disease of native coronary artery without angina pectoris: Secondary | ICD-10-CM

## 2010-06-16 DIAGNOSIS — E119 Type 2 diabetes mellitus without complications: Secondary | ICD-10-CM | POA: Insufficient documentation

## 2010-07-11 IMAGING — MR MR [PERSON_NAME] LOW JT W/O CM*R*
5 series · 40 of 40 positions shown · non-contrast
Comparison: None.

CLINICAL DATA: Medial knee pain and swelling.

MRI OF THE RIGHT KNEE WITHOUT CONTRAST
TECHNIQUE: Multiplanar, multisequence MR imaging of the  right
knee was performed.  No intravenous contrast was administered.

[Series 3: pdfs blade axial · axial · 4.0mm · 0.59mm/px · z∈[-42,+68]mm · 7 of 24 slices shown]
[im 1/24]
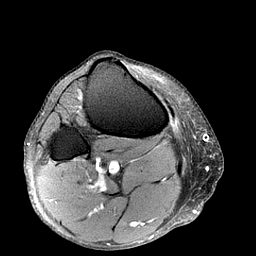
[im 4/24]
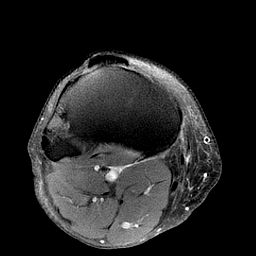
[im 8/24]
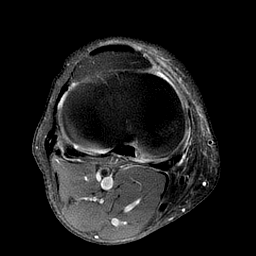
[im 12/24]
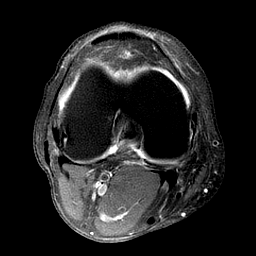
[im 16/24]
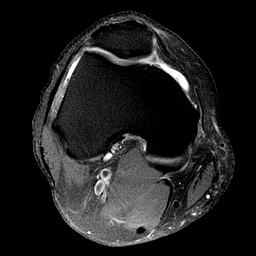
[im 20/24]
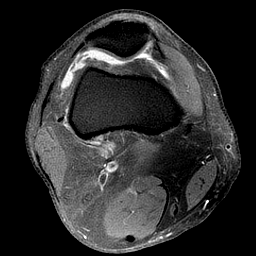
[im 24/24]
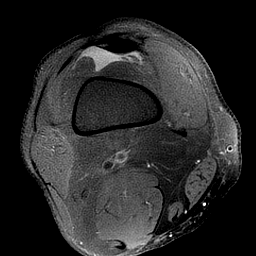

[Series 4: T1 · coronal · 3.0mm · 0.58mm/px · 9 of 32 slices shown]
[im 1/32]
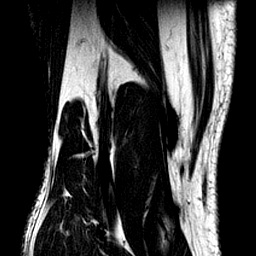
[im 4/32]
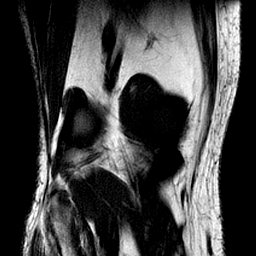
[im 8/32]
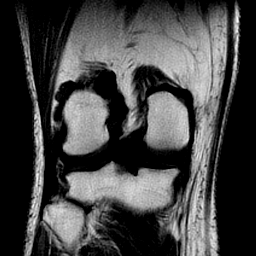
[im 12/32]
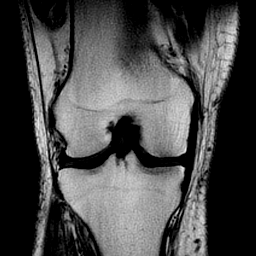
[im 16/32]
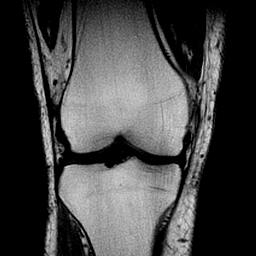
[im 20/32]
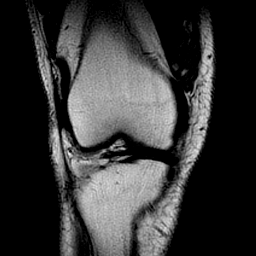
[im 24/32]
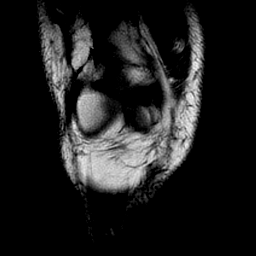
[im 28/32]
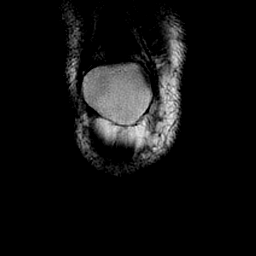
[im 32/32]
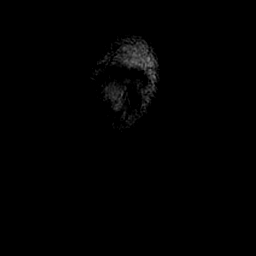

[Series 5: pdfs blade cor · coronal · 3.0mm · 0.55mm/px · 9 of 32 slices shown]
[im 1/32]
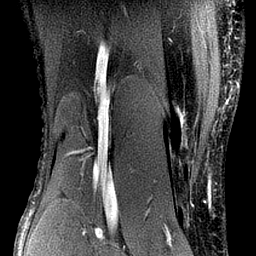
[im 4/32]
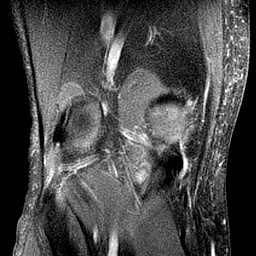
[im 8/32]
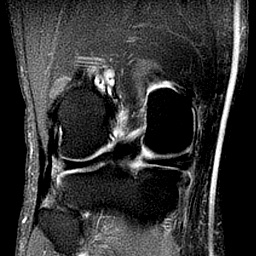
[im 12/32]
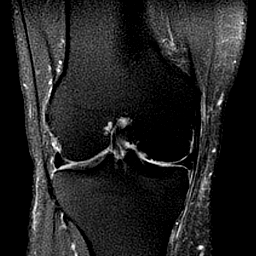
[im 16/32]
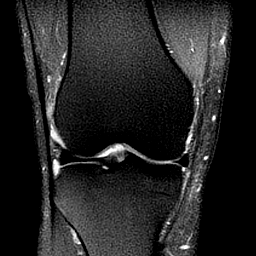
[im 20/32]
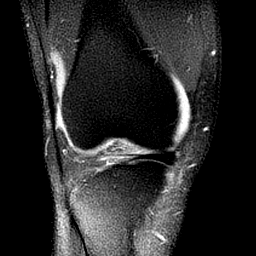
[im 24/32]
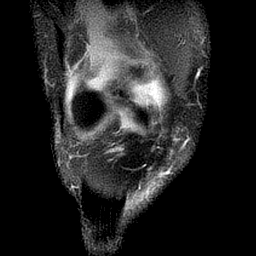
[im 28/32]
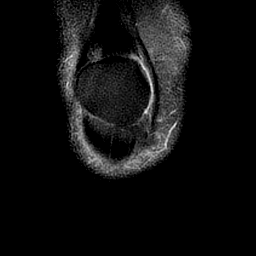
[im 32/32]
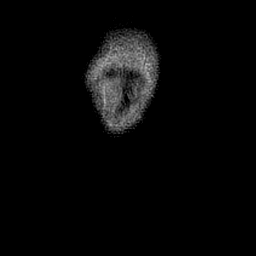

[Series 6: t2fs blade cor · coronal · 3.0mm · 0.54mm/px · 9 of 32 slices shown]
[im 1/32]
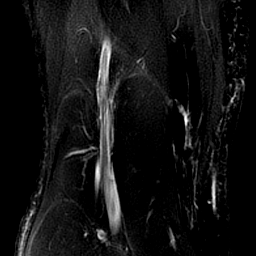
[im 4/32]
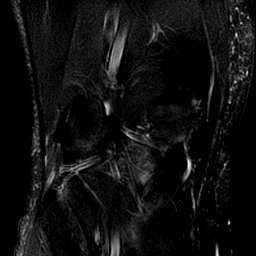
[im 8/32]
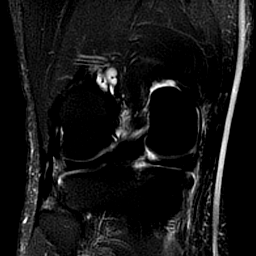
[im 12/32]
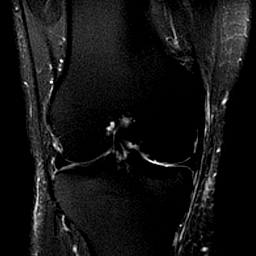
[im 16/32]
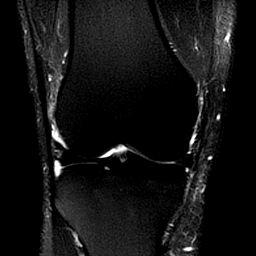
[im 20/32]
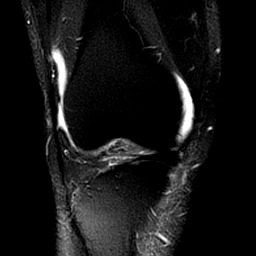
[im 24/32]
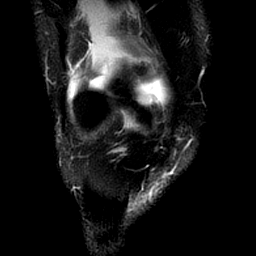
[im 28/32]
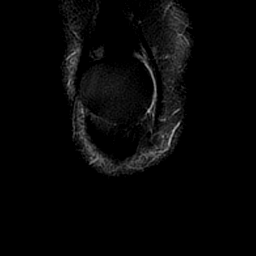
[im 32/32]
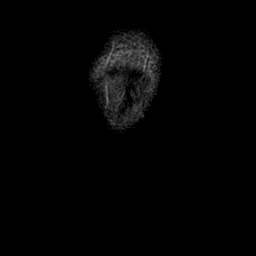

[Series 7: se pdfs sag · sagittal · 4.0mm · 0.30mm/px · 6 of 23 slices shown]
[im 1/23]
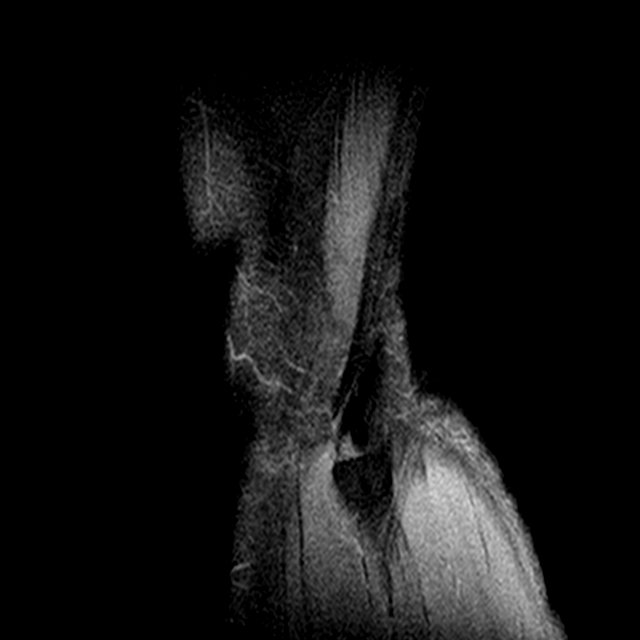
[im 5/23]
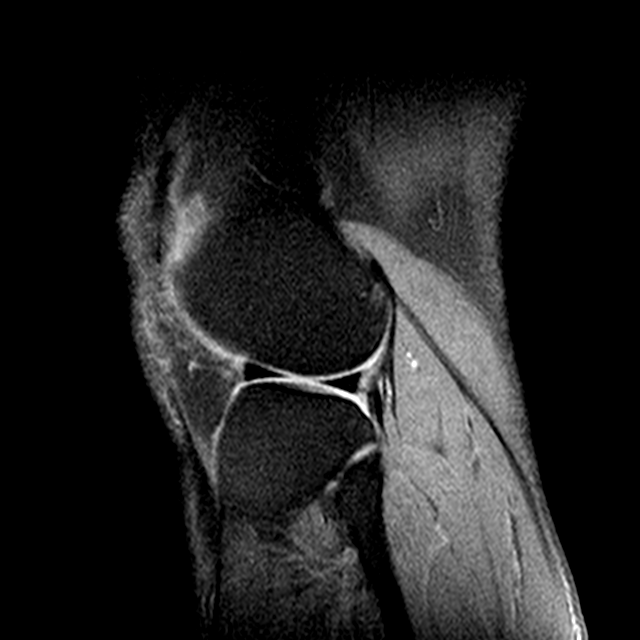
[im 9/23]
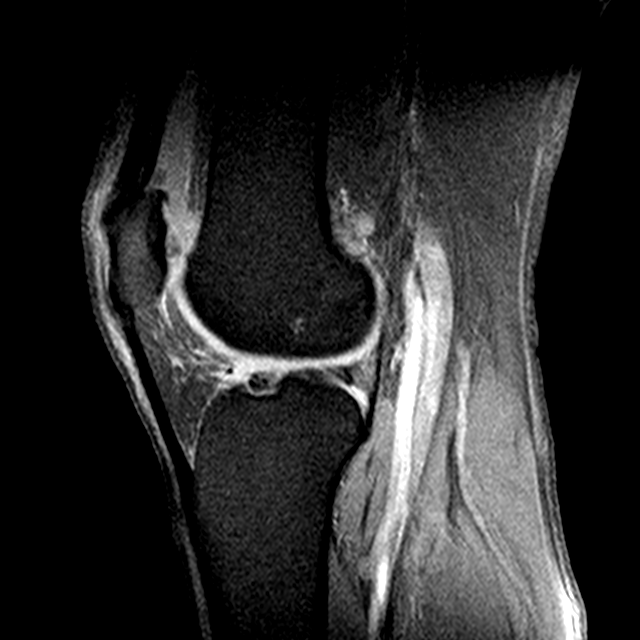
[im 14/23]
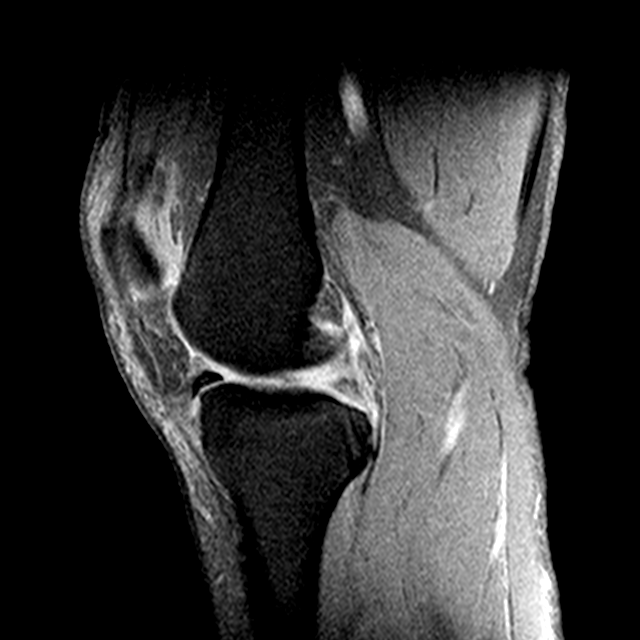
[im 18/23]
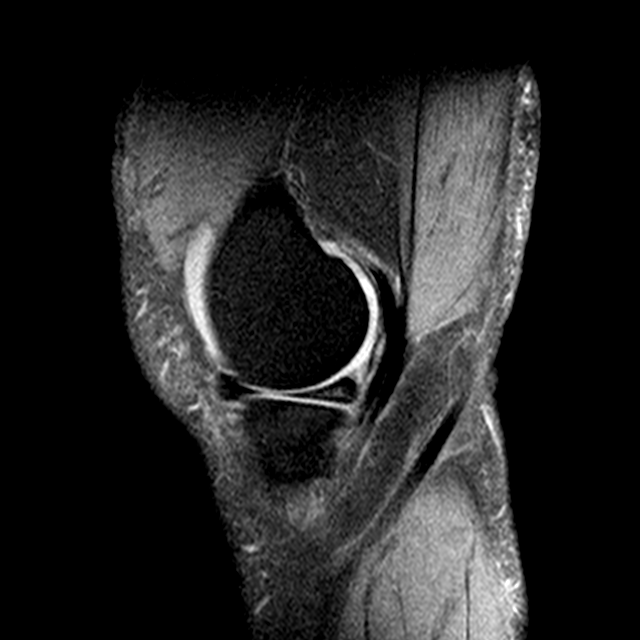
[im 23/23]
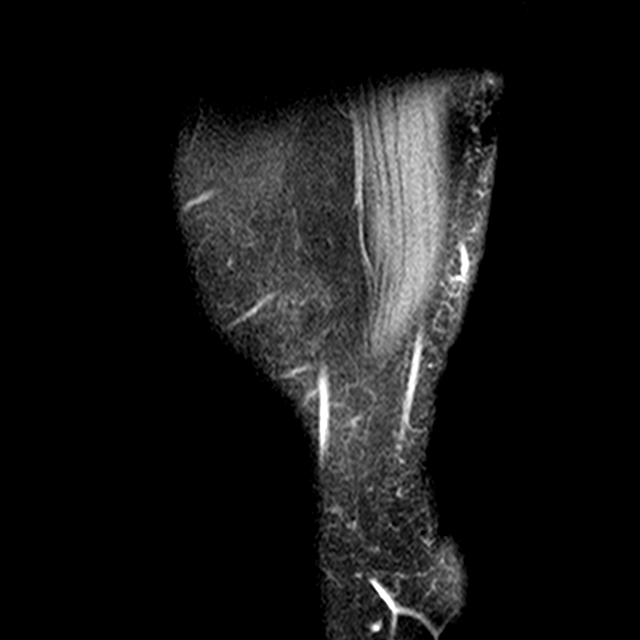

[40 of 40 positions shown; findings below may reference images not displayed]

FINDINGS: There is mild degeneration of the posterior horn of the
medial meniscus particularlyat  the meniscal root but there is no
discrete meniscal root tear.  The remainder of the medial meniscus
is normal.  Lateral meniscus is normal.

The cruciate and collateral ligaments are normal.  Distal
quadriceps tendon and patellar tendon are normal.  There is a small
joint effusion with a small complex popliteal cyst adjacent to the
origin of the lateral head of the gastrocnemius from the distal
femur.  This is only 14 mm in diameter.

There is no significant chondromalacia or joint space narrowing and
there is no marginal spur formation.
IMPRESSION: 1.  Small nonspecific joint effusion with a small ganglion cyst
posterior to the lateral femoral condyle.
2.  Mild intrinsic degeneration of the posterior horn and root of
the medial meniscus without a discrete tear.

## 2010-07-28 ENCOUNTER — Emergency Department (HOSPITAL_COMMUNITY)
Admission: EM | Admit: 2010-07-28 | Discharge: 2010-07-28 | Payer: Self-pay | Source: Home / Self Care | Admitting: Emergency Medicine

## 2010-08-03 ENCOUNTER — Ambulatory Visit (HOSPITAL_COMMUNITY): Payer: Self-pay | Admitting: Psychiatry

## 2010-08-10 ENCOUNTER — Ambulatory Visit (HOSPITAL_COMMUNITY): Payer: Self-pay | Admitting: Psychiatry

## 2010-08-17 ENCOUNTER — Ambulatory Visit (HOSPITAL_COMMUNITY): Payer: Self-pay | Admitting: Psychiatry

## 2010-08-19 ENCOUNTER — Emergency Department (HOSPITAL_COMMUNITY): Admission: EM | Admit: 2010-08-19 | Discharge: 2010-08-20 | Payer: Self-pay | Admitting: Emergency Medicine

## 2010-08-22 ENCOUNTER — Ambulatory Visit: Payer: Self-pay | Admitting: Cardiology

## 2010-08-22 ENCOUNTER — Encounter: Payer: Self-pay | Admitting: Adult Health

## 2010-08-22 DIAGNOSIS — R079 Chest pain, unspecified: Secondary | ICD-10-CM | POA: Insufficient documentation

## 2010-09-01 ENCOUNTER — Ambulatory Visit (HOSPITAL_COMMUNITY): Admission: RE | Admit: 2010-09-01 | Payer: Self-pay | Admitting: Cardiology

## 2010-09-01 ENCOUNTER — Ambulatory Visit (HOSPITAL_COMMUNITY): Admission: RE | Admit: 2010-09-01 | Discharge: 2010-09-01 | Payer: Self-pay | Admitting: Family Medicine

## 2010-09-01 ENCOUNTER — Ambulatory Visit (HOSPITAL_COMMUNITY): Payer: Self-pay | Admitting: Psychiatry

## 2010-09-04 ENCOUNTER — Ambulatory Visit: Payer: Self-pay | Admitting: Internal Medicine

## 2010-09-04 DIAGNOSIS — K6289 Other specified diseases of anus and rectum: Secondary | ICD-10-CM

## 2010-09-04 HISTORY — DX: Other specified diseases of anus and rectum: K62.89

## 2010-09-14 ENCOUNTER — Ambulatory Visit (HOSPITAL_COMMUNITY): Payer: Self-pay | Admitting: Psychiatry

## 2010-09-22 ENCOUNTER — Encounter: Payer: Self-pay | Admitting: Family Medicine

## 2010-10-03 ENCOUNTER — Ambulatory Visit (HOSPITAL_COMMUNITY): Payer: Self-pay | Admitting: Psychiatry

## 2010-10-09 IMAGING — MR MR THORACIC SPINE W/O CM
4 of 6 series · 14 of 48 positions shown · non-contrast
Comparison: Lumbar MRI dated 12/13/2008 and a chest x-ray dated
10/26/2008

CLINICAL DATA: Thoracic and lumbar pain radiating to the right leg
for 1 year.

MRI THORACIC SPINE WITHOUT CONTRAST
TECHNIQUE: Multiplanar and multiecho pulse sequences of the
thoracic spine were obtained without intravenous contrast.

[Series 4: T1 · sagittal · 3.0mm · 0.69mm/px · 3 of 12 slices shown (1 of 2)]
[im 2/12]
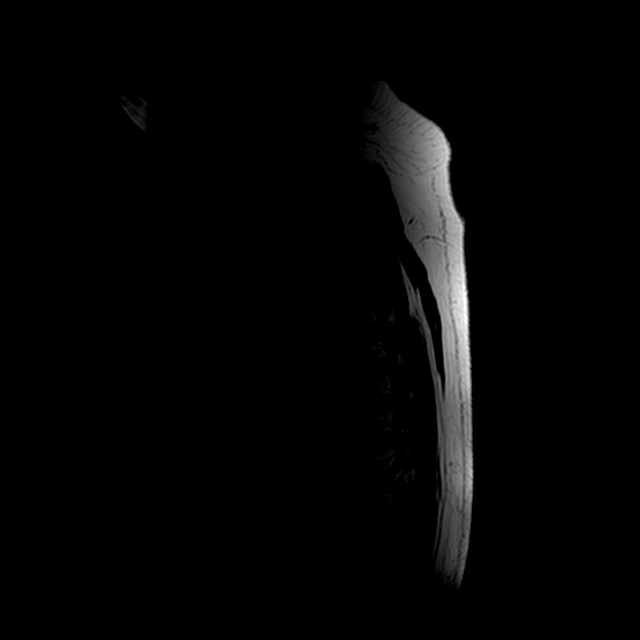
[im 7/12]
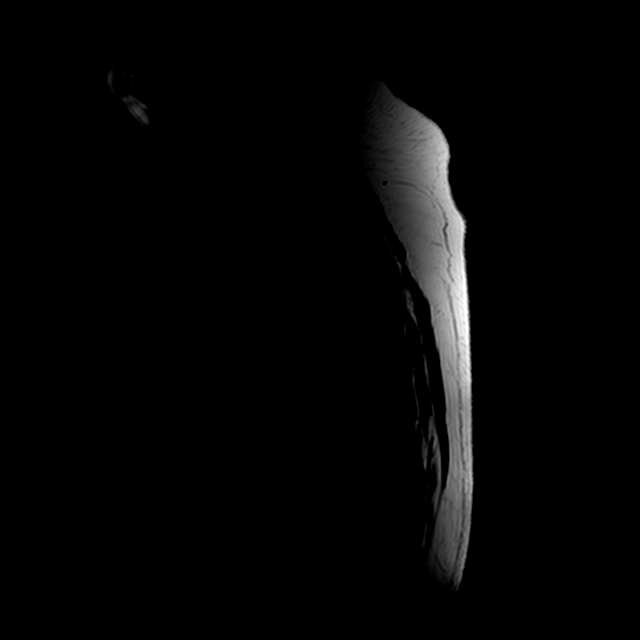
[im 10/12]
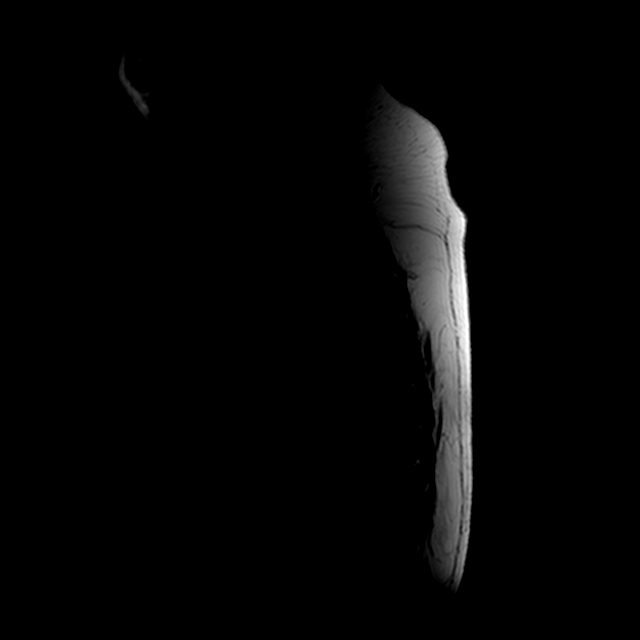

[Series 5: T2 · sagittal · 4.0mm · 0.50mm/px · 5 of 13 slices shown (1 of 2)]
[im 1/13]
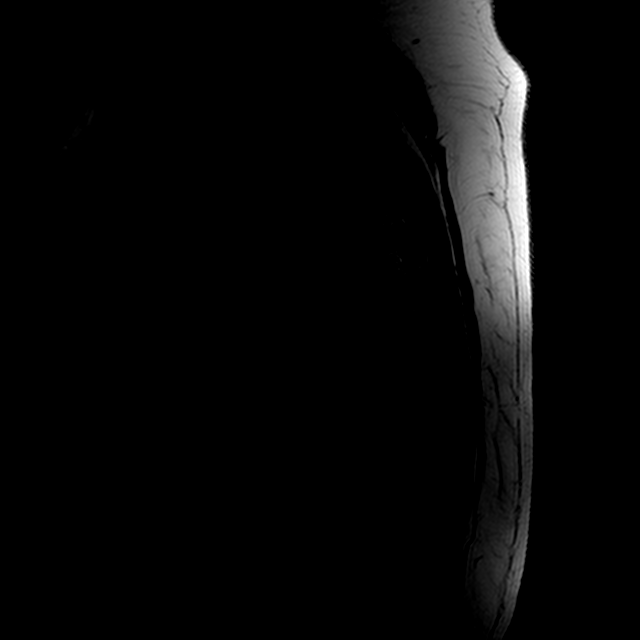
[im 2/13]
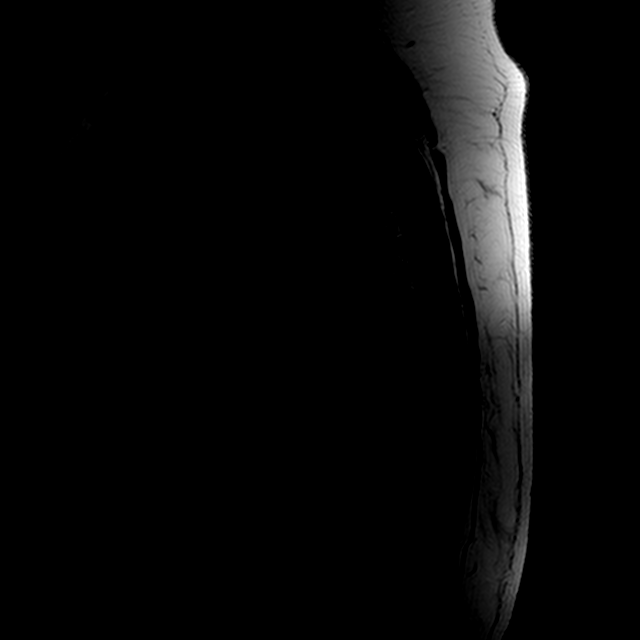
[im 4/13]
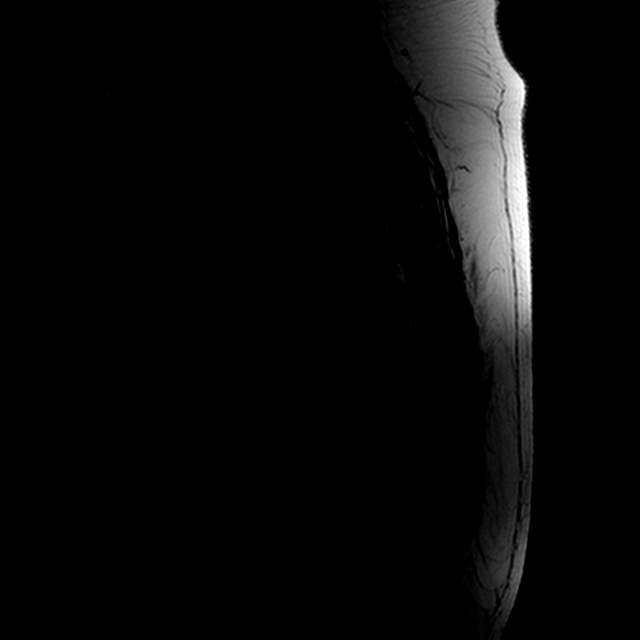
[im 7/13]
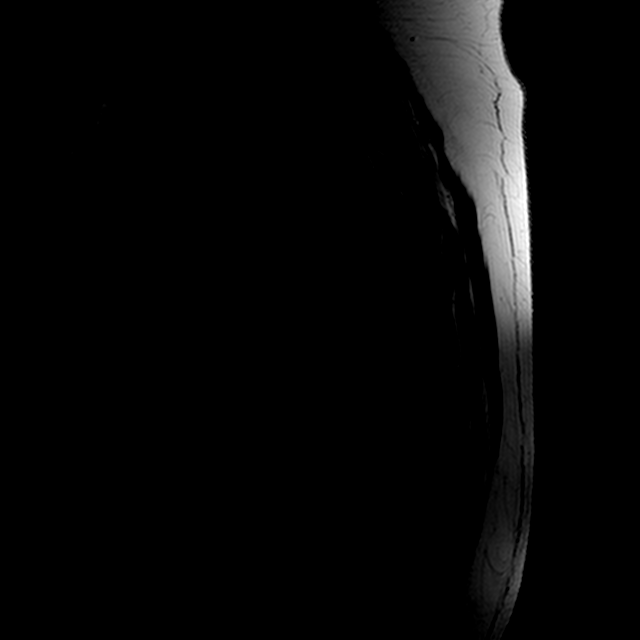
[im 11/13]
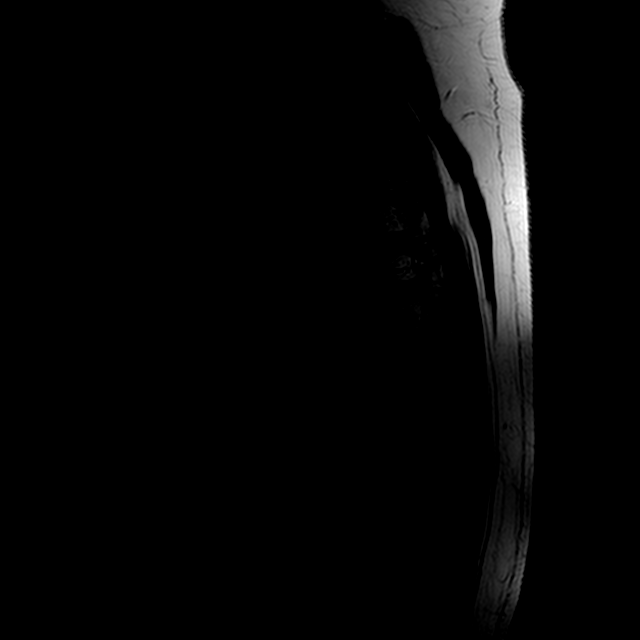

[Series 6: T1 · sagittal · 4.0mm · 0.50mm/px · 3 of 13 slices shown (2 of 2)]
[im 2/13]
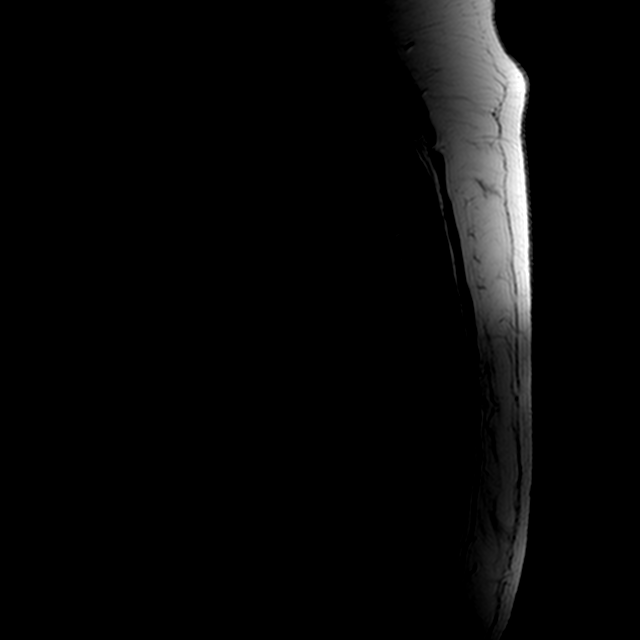
[im 7/13]
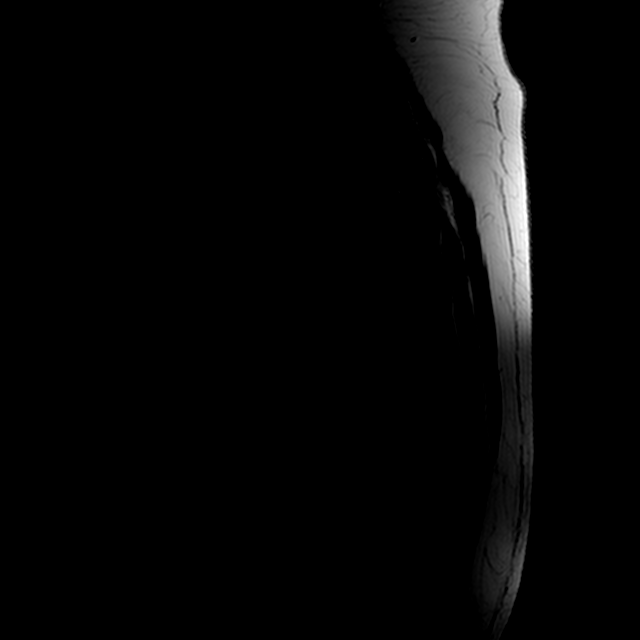
[im 11/13]
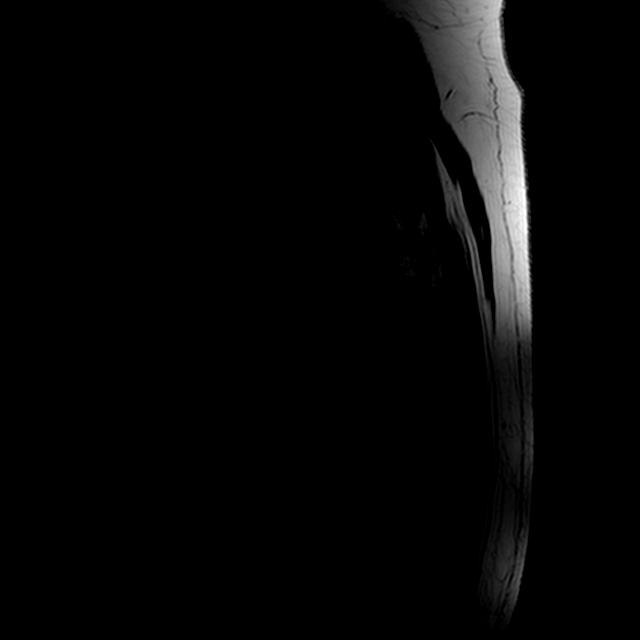

[Series 9: T2 · axial · 3.0mm · 0.31mm/px · z∈[-125,+14]mm · 3 of 12 slices shown (2 of 2)]
[im 2/12]
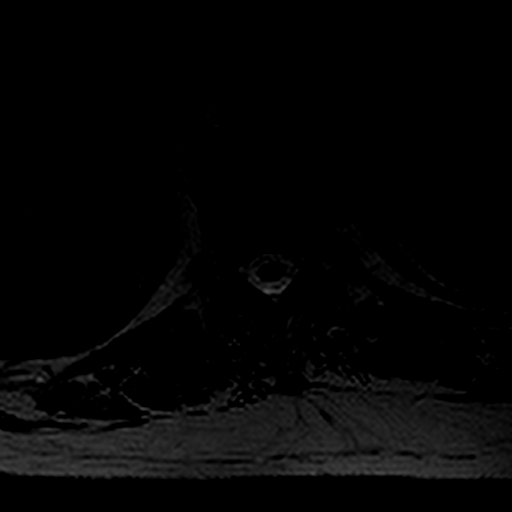
[im 7/12]
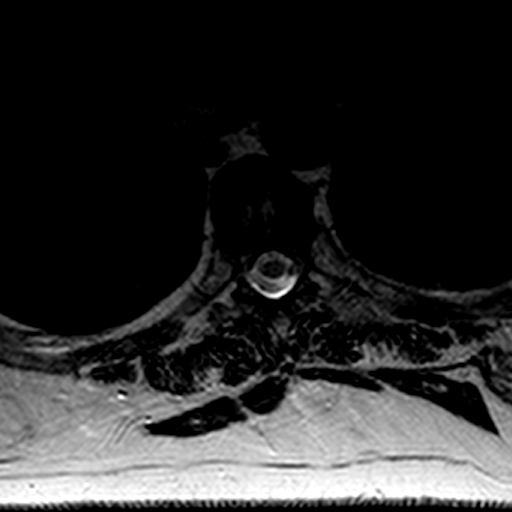
[im 10/12]
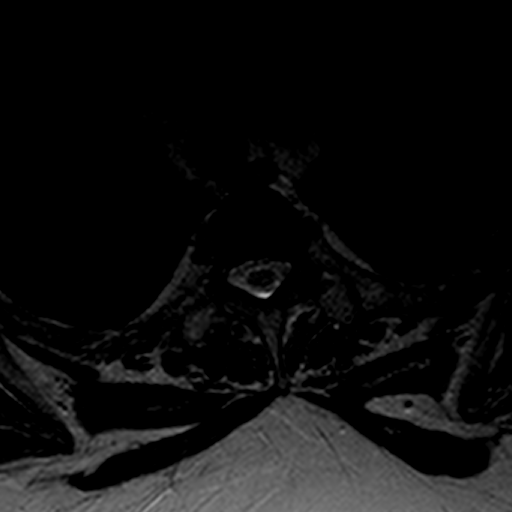

[14 of 48 positions shown; findings below may reference images not displayed]

FINDINGS: The scan extends from C7-T1 through T12-L1.

There is a tiny disc protrusion at T7-8 to the left of midline
slightly indenting the left side of the thecal sac and the spinal
cord but without myelopathy.

There is a tiny disc bulge at T8-9 slightly asymmetric to the right
with no impingement upon the spinal cord or nerve roots.

There is no mass lesion or myelopathy or thoracic spinal stenosis.
The paraspinal soft tissues are normal.  There is no foraminal
stenosis or significant facet joint arthritis.
IMPRESSION: 1.  Small disc protrusion at  T7-8 to the left of midline with a
minimal mass effect upon the ventral aspect of the spinal cord.
2.  Tiny disc protrusion at T8-9 with no significant impingement.
3.  Otherwise normal MRI of the thoracic spine.

## 2010-10-09 IMAGING — MR MR LUMBAR SPINE W/O CM
4 of 5 series · 16 of 48 positions shown · non-contrast
Comparison: 12/13/2008

CLINICAL DATA: Thoracic and lumbar spine pain and right leg pain
for 1 year.

MRI LUMBAR SPINE WITHOUT CONTRAST
TECHNIQUE: Multiplanar and multiecho pulse sequences of the lumbar
spine were obtained without intravenous contrast.

[Series 4: T2 · sagittal · 4.0mm · 0.94mm/px · 4 of 12 slices shown (1 of 2)]
[im 1/12]
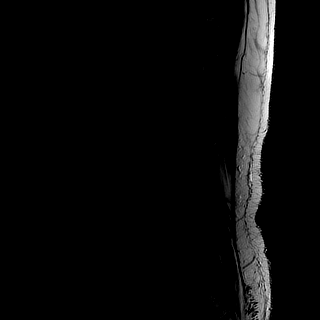
[im 4/12]
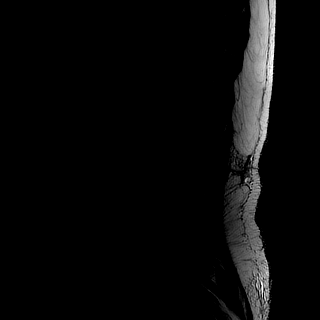
[im 8/12]
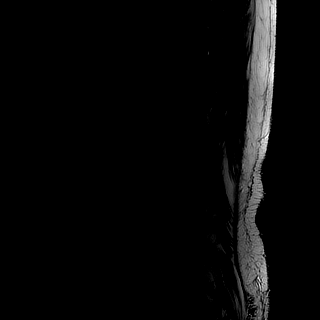
[im 12/12]
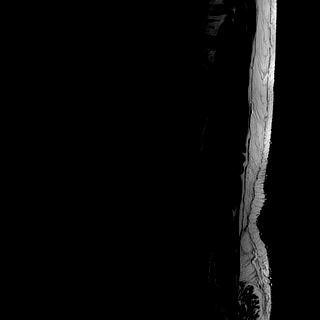

[Series 9: T2 · axial · 4.0mm · 0.29mm/px · z∈[-152,+25]mm · 6 of 41 slices shown (2 of 2)]
[im 3/41]
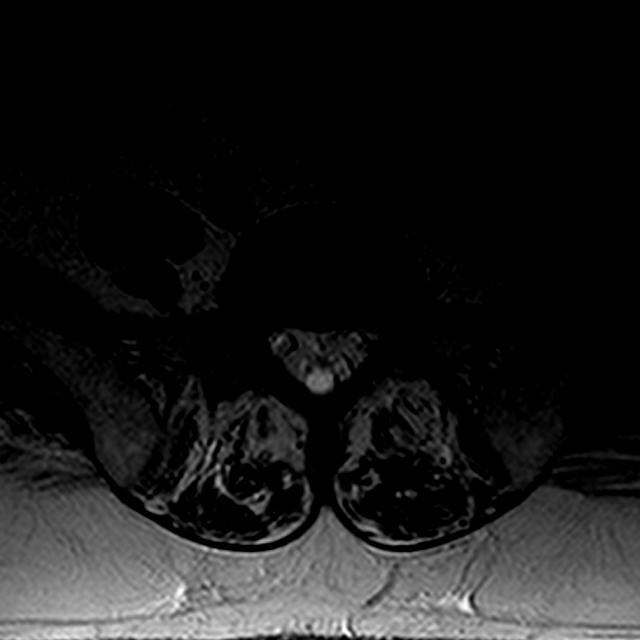
[im 6/41]
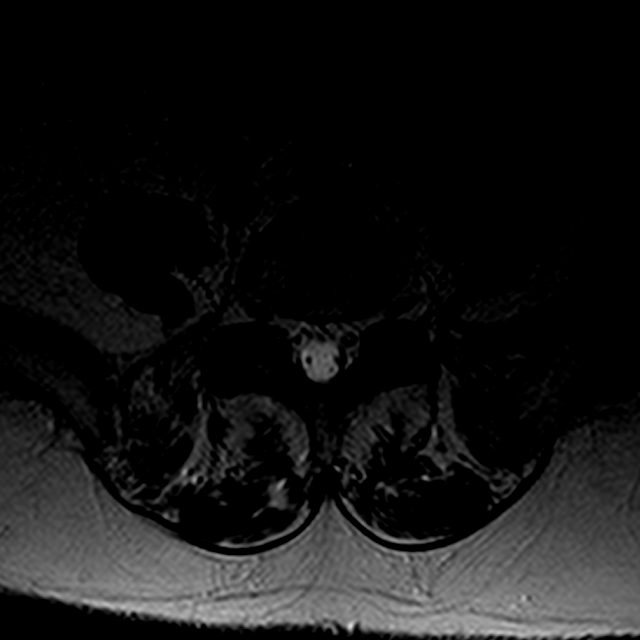
[im 8/41]
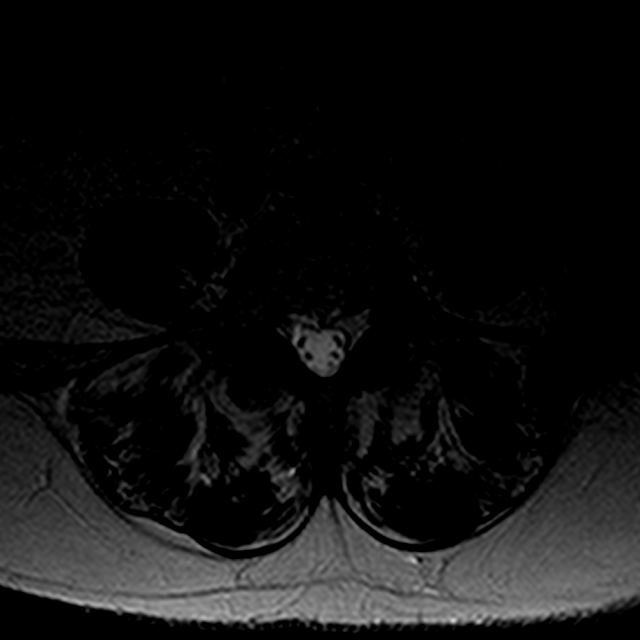
[im 13/41]
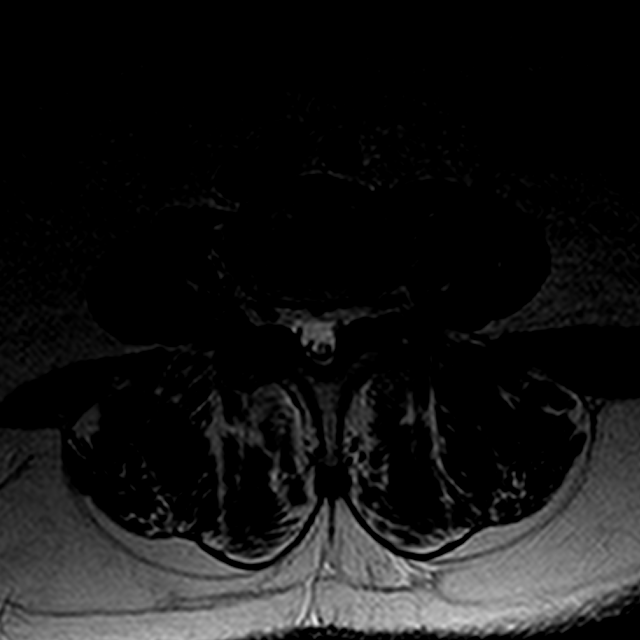
[im 21/41]
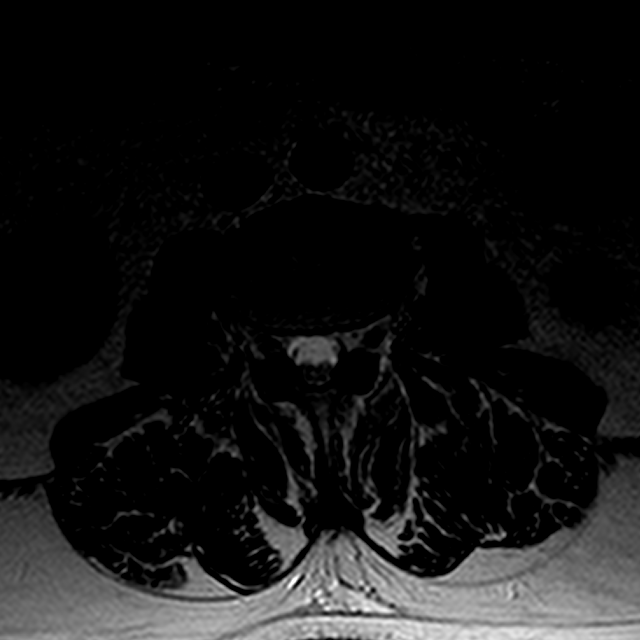
[im 36/41]
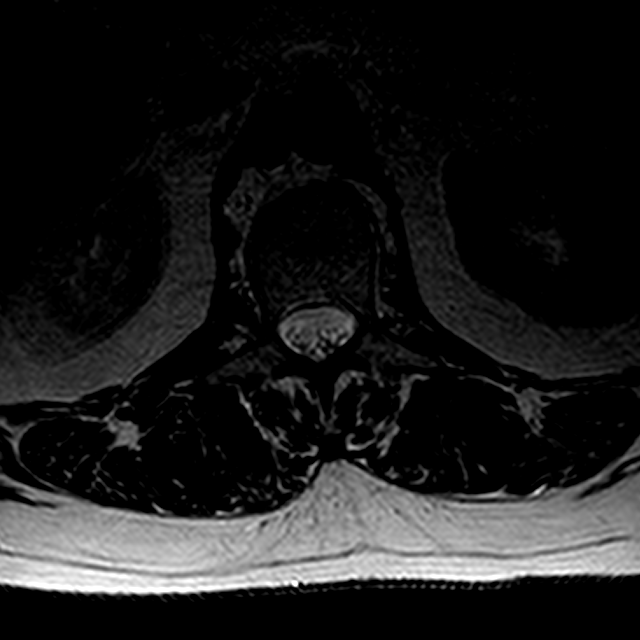

[Series 10: T1 · axial · 4.0mm · 0.29mm/px · z∈[-138,+25]mm · 3 of 41 slices shown (1 of 2)]
[im 6/41]
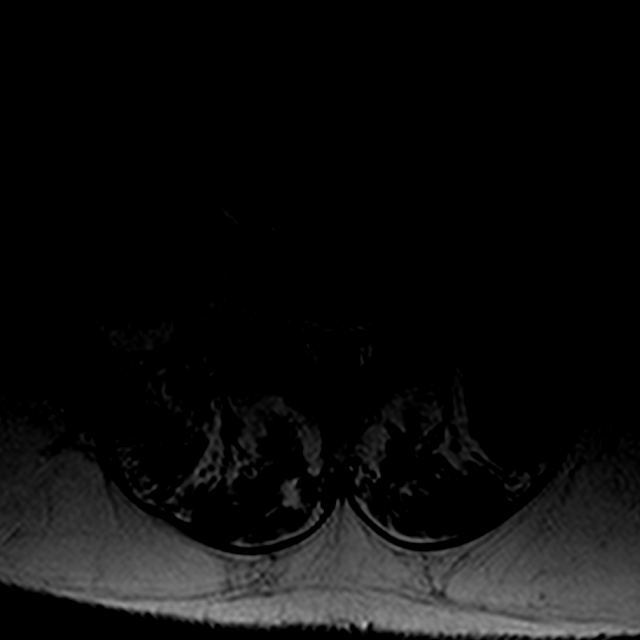
[im 21/41]
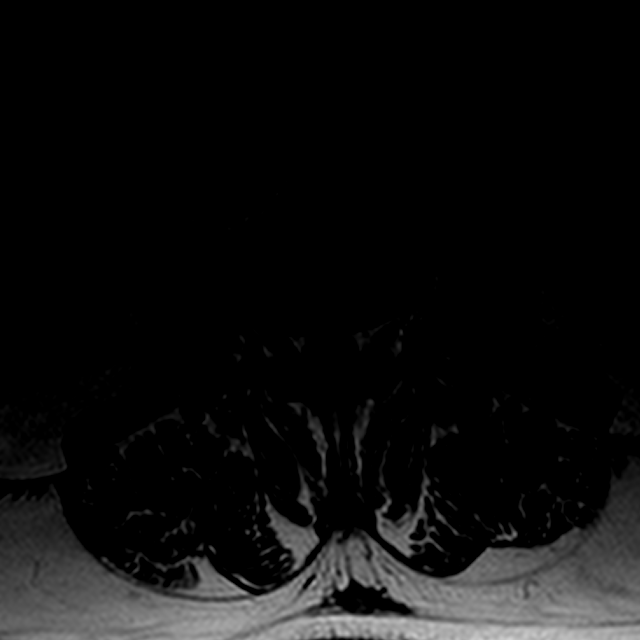
[im 36/41]
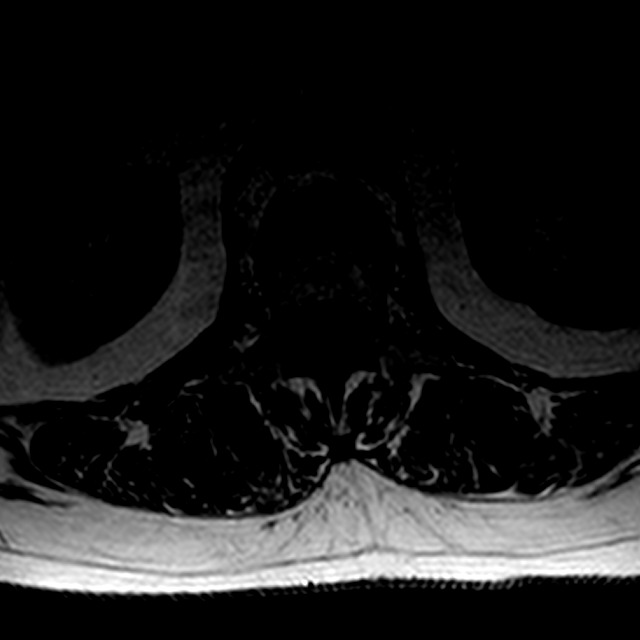

[Series 11: T1 · sagittal · 4.0mm · 0.49mm/px · 3 of 12 slices shown (2 of 2)]
[im 1/12]
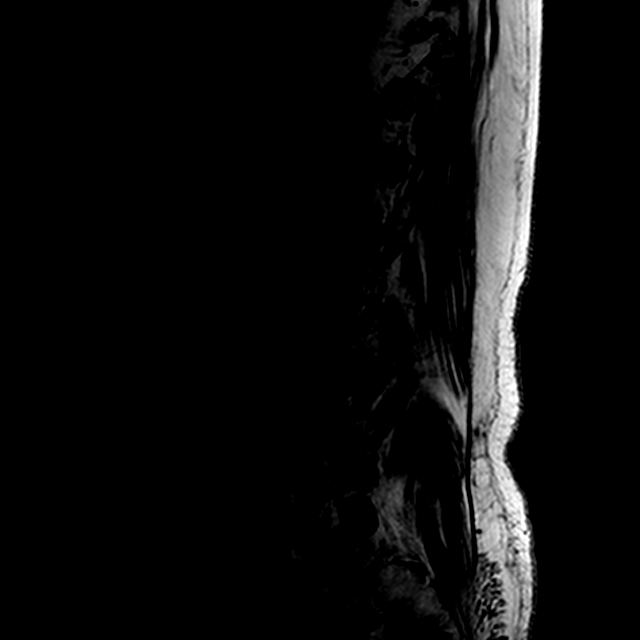
[im 6/12]
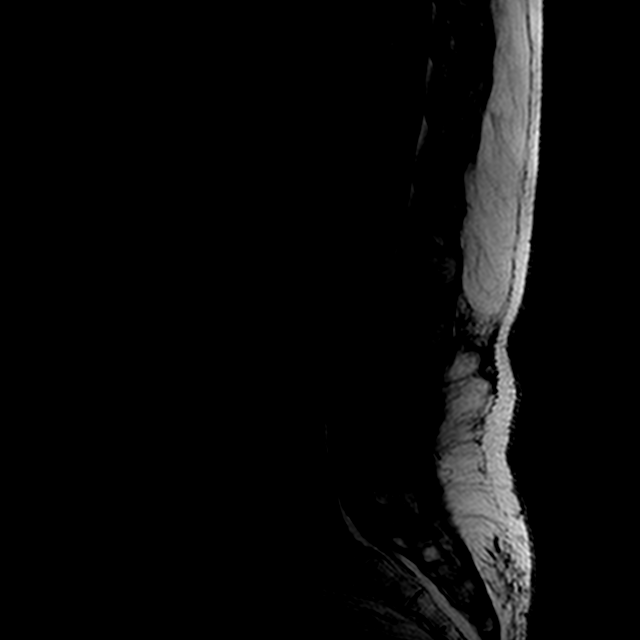
[im 12/12]
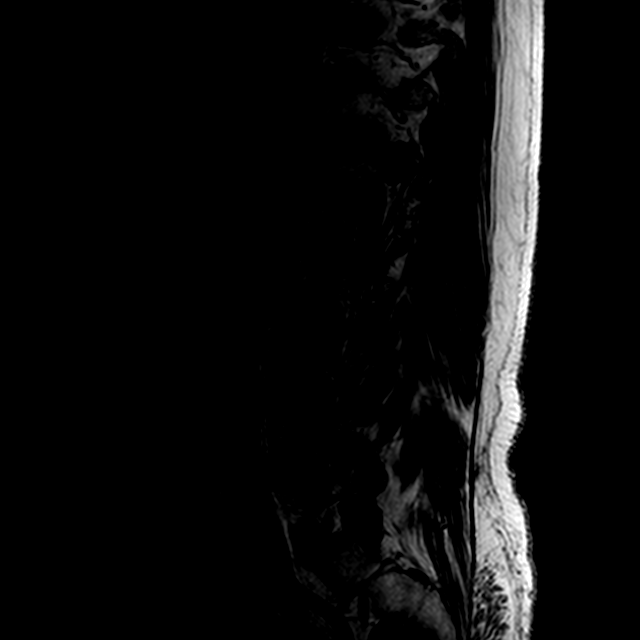

[16 of 48 positions shown; findings below may reference images not displayed]

FINDINGS: The scan extends from T9-10 through the third sacral
segment.  The tip of the conus is at T12-L1 and appears normal. The
individual disc spaces are examined as follows:

T9-10 through T12-L1:  Normal.

L1-2: There is a small diffuse bulge of the disc with no
significant impingement.

L2-3: There is a tiny diffuse bulge of the disc with no significant
impingement.

L3-4: The disc is normal.  There is mild hypertrophy of the facet
joints with no spinal stenosis.

L4-5: There is slight anterolisthesis of L4-L5 with moderate facet
joint hypertrophy without discrete nerve root impingement.

L5-S1: The disc is normal.  There is mild right facet joint
hypertrophy.

The paraspinal soft tissues are normal.
IMPRESSION: 1.  No significant change in the appearance of the lumbar spine
since the prior exam.
2.  Facet joint arthritis primarily at L4-5 with a slight
spondylolisthesis at L4-L5 without discrete neural compression.

## 2010-10-11 ENCOUNTER — Ambulatory Visit (HOSPITAL_COMMUNITY): Payer: Self-pay | Admitting: Psychiatry

## 2010-10-17 ENCOUNTER — Encounter
Admission: RE | Admit: 2010-10-17 | Discharge: 2010-10-17 | Payer: Self-pay | Source: Home / Self Care | Attending: Family Medicine | Admitting: Family Medicine

## 2010-10-24 ENCOUNTER — Ambulatory Visit (HOSPITAL_COMMUNITY): Admit: 2010-10-24 | Payer: Self-pay | Admitting: Psychiatry

## 2010-11-05 ENCOUNTER — Encounter: Payer: Self-pay | Admitting: Obstetrics and Gynecology

## 2010-11-05 ENCOUNTER — Encounter: Payer: Self-pay | Admitting: Family Medicine

## 2010-11-13 ENCOUNTER — Ambulatory Visit (HOSPITAL_COMMUNITY): Admit: 2010-11-13 | Payer: Self-pay | Admitting: Psychiatry

## 2010-11-14 ENCOUNTER — Ambulatory Visit (HOSPITAL_COMMUNITY)
Admission: RE | Admit: 2010-11-14 | Discharge: 2010-11-14 | Payer: Self-pay | Source: Home / Self Care | Attending: Psychiatry | Admitting: Psychiatry

## 2010-11-16 NOTE — Progress Notes (Signed)
Summary: chest pain   Phone Note Call from Patient Call back at Home Phone [phone]   Caller: pt Reason for Call: Talk to Nurse Summary of Call: S: yesterday evening she started having pain in her right brest area and it lasted through the night and is worse this morning, She states that it is not the same as when she had a heart attack( that was more back pain) and she is under a lot of stress. Initial call taken by: Nevada Crane,  March 22, 2010 9:28 AM  Follow-up for Phone Call        I spoke with patient and sent her directly to the ED, called [facility] and spoke with Nonie, Charge Nurse to tell her pt was coming. Follow-up by: Tye Savoy RN,  March 22, 2010 10:15 AM

## 2010-11-16 NOTE — Letter (Signed)
Summary: Stress Echocardiogram Information Sheet   [facility]. [address], [place] [address]   Phone: [phone]  Fax: [phone]      August 22, 2010 MRN: [id number] light prior to the test.   Anders Grant  Doctor: Appointment Date: Appointment Time: Appointment Location: [facility]  Stress Echocardiogram Information Sheet    Instructions:   1. DO NOT  take your Metoprolol the night before or the morning of test.  2. Do not eat or drink after midnight the night before test.  3. Dress prepared to exercise.  4. DO NOT use ANY caffine or tobacco products 3 hours before appointment.  5. Report to the [facility] on the1st floor.  6. Please bring all current prescription medications.  7. If you have any questions, please call [phone]

## 2010-11-16 NOTE — Letter (Signed)
Summary: chart review  chart review   Imported By: Dierdre Harness 09/22/2010 14:45:40  _____________________________________________________________________  External Attachment:    Type:   Image     Comment:   External Document
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
## 2010-11-16 NOTE — Miscellaneous (Signed)
Summary: labs hospital 03/22/2010  Clinical Lists Changes  Observations: Added new observation of TROPONIN I: <0.05 (03/22/2010 9:37) Added new observation of CK-MB ISOENZ: <0.1 (03/22/2010 9:37) Added new observation of CALCIUM: 9.3 mg/dL (03/22/2010 9:37) Added new observation of GFR AA: >60 mL/min/1.57m (03/22/2010 9:37) Added new observation of GFR: >60 mL/min (03/22/2010 9:37) Added new observation of CREATININE: 0.74 mg/dL (03/22/2010 9:37) Added new observation of BUN: 6 mg/dL (03/22/2010 9:37) Added new observation of BG RANDOM: 95 mg/dL (03/22/2010 9:37) Added new observation of CO2 PLSM/SER: 27 meq/L (03/22/2010 9:37) Added new observation of CL SERUM: 106 meq/L (03/22/2010 9:37) Added new observation of K SERUM: 3.8 meq/L (03/22/2010 9:37) Added new observation of NA: 139 meq/L (03/22/2010 9:37) Added new observation of ABSOLUTE BAS: 0.0 K/uL (03/22/2010 9:37) Added new observation of BASOPHIL %: 1 % (03/22/2010 9:37) Added new observation of EOS ABSLT: 0.2 K/uL (03/22/2010 9:37) Added new observation of % EOS AUTO: 4 % (03/22/2010 9:37) Added new observation of ABSOLUTE MON: 0.6 K/uL (03/22/2010 9:37) Added new observation of MONOCYTE %: 10 % (03/22/2010 9:37) Added new observation of ABS LYMPHOCY: 2.9 K/uL (03/22/2010 9:37) Added new observation of LYMPHS %: 48 % (03/22/2010 9:37) Added new observation of PLATELETK/UL: 213 K/uL (03/22/2010 9:37) Added new observation of RDW: 14.6 % (03/22/2010 9:37) Added new observation of MCHC RBC: 34.0 g/dL (03/22/2010 9:37) Added new observation of MCV: 102.8 fL (03/22/2010 9:37) Added new observation of HCT: 34.0 % (03/22/2010 9:37) Added new observation of HGB: 11.6 g/dL (03/22/2010 9:37) Added new observation of RBC M/UL: 3.31 M/uL (03/22/2010 9:37) Added new observation of WBC COUNT: 6.2 10*3/microliter (03/22/2010 9:37)

## 2010-11-16 NOTE — Letter (Signed)
Summary: Appointment - Missed  [facility]. [address], [place] [address]   Phone: [phone]  Fax: [phone]     June 16, 2010 MRN: [id number]   [name] [address] [place],   [address]   Dear [name],  Our records indicate you missed your appointment on             06/16/10 with Dr.    Lattie Haw   .                                    It is very important that we reach you to reschedule this appointment. We look forward to participating in your health care needs. Please contact us at the number listed above at your earliest convenience to reschedule this appointment.     Sincerely,    Public relations account executive

## 2010-11-16 NOTE — Assessment & Plan Note (Signed)
Summary: EPH  Medications Added LEVOTHROID 300 MCG TABS (LEVOTHYROXINE SODIUM) take 1 tab daily NITROSTAT 0.4 MG SUBL (NITROGLYCERIN) 1 tablet under tongue at onset of chest pain; you may repeat every 5 minutes for up to 3 doses.      Allergies Added:   Visit Type:  Follow-up Primary Provider:  Dr. Lucia Gaskins   History of Present Illness: [name] is a 60 y/o obese CF who has been lost to cardiology follow-up with known history of recurrent chest pain-s/p cardiac catherization in 2004 demonstrating normal coronaries, s/p stress myoview in 2006 wihich was negative for ischemia, diabetes, hypertension, anxiety, COPD, and s/p esophageal stricture dilitation, who was recently seen in [facility] ER for recurrent chest pain. Review of records demonstrates prior MI in 1996 and BMS in 2000, although, not documentation is seen of this procedure.  She describes her chest pain as two-fold.  she has discomfort in chest with deep inspirations and movement of upper torso. Also sharp pain in her right breast radiating to her back.  The sharp pain was transient, but the chest discomfort with inspiration has been going on "for months."  She states that she has had no DOE, dizziness or diaphoresis associated.  She unfortunatley continues to smoke, after stopping for one month in Sept.  She states that her blood glucose has been "fair" ranging from 120 -150 fasting. She continues chest discomfort here in the office with deep breathing and movement of upper torso and mild palpation.  Preventive Screening-Counseling & Management  Alcohol-Tobacco     Smoking Status: current     Packs/Day: 1.0  Current Medications (verified): 1)  Metformin Hcl 1000 Mg Tabs (Metformin Hcl) .... Two Times A Day 2)  Pravastatin Sodium 40 Mg Tabs (Pravastatin Sodium) .... One Daily 3)  Glyburide 5 Mg Tabs (Glyburide) .... Two Times A Day 4)  Aspir-Low 81 Mg Tbec (Aspirin) .... Once Daily 5)  Metoprolol Tartrate 50 Mg Tabs  (Metoprolol Tartrate) .... 1/2 Two Times A Day 6)  Citalopram Hydrobromide 40 Mg Tabs (Citalopram Hydrobromide) .... One Daily 7)  Nitroglycerin 0.4 Mg Subl (Nitroglycerin) .... As Needed 8)  Lisinopril 20 Mg Tabs (Lisinopril) .... One Daily 9)  Klonopin 1 Mg Tabs (Clonazepam) .... One By Mouth Twice Daily As Needed. 10)  Levothroid 300 Mcg Tabs (Levothyroxine Sodium) .... Take 1 Tab Daily 11)  Nexium 40 Mg  Pack (Esomeprazole Magnesium) .... Once Daily 12)  Proventil 90 Mcg/act  Aers (Albuterol) .... 2 Puffs Daily Every 6 Hours As Needed 13)  Fish Oil Concentrate 1000 Mg  Caps (Omega-3 Fatty Acids) .... Two Times A Day 14)  Niaspan 1000 Mg Cr-Tabs (Niacin (Antihyperlipidemic)) .... One Daily At Bedtime Per Dr. Lattie Haw 15)  Welchol 625 Mg  Tabs (Colesevelam Hcl) .... Three Two Times A Day 16)  Fannie Cream .... Apply To Breasts Two Times A Day As Needed 17)  Promethazine Hcl 25 Mg Tabs (Promethazine Hcl) .... One Every 6 Hours As Needed For Nausea 18)  Nitrostat 0.4 Mg Subl (Nitroglycerin) .Marland Kitchen.. 1 Tablet Under Tongue At Onset of Chest Pain; You May Repeat Every 5 Minutes For Up To 3 Doses.  Allergies (verified): 1)  ! * Ivp Dye 2)  ! Cipro 3)  ! Pcn 4)  ! Betadine 5)  ! Neosporin 6)  ! * Tape  Comments:  Nurse/Medical Assistant: patient didn't bring meds or list went over meds from ov 2009  Past History:  Past medical, surgical, family and social histories (including risk  factors) reviewed, and no changes noted (except as noted below).  Past Medical History: Reviewed history from 06/16/2010 and no changes required. ASCVD: Acute MI in 1996 requiring BMS CX; DES to M1 in 2000; normal coronary angiography in 2004; normal EF Hyperlipidemia Hypothyroidism Tobacco abuse-1/2 pack per day COPD Depression Diabetes mellitus, type II    DVT, hx of Nephrolithiasis-stone extraction in 2006 Traumatic amputation of right hand Allergic rhinitis Cholelithiasis GERD/dysphasia Low back  pain Peripheral neuropathy  Past Surgical History: Reviewed history from 06/16/2010 and no changes required. 1. D & C 1974 2. Partial Hysterectomy  1978 3. 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 OOPHERECTOMY 2002 5. LEFT ARM SURGERY 1998 - elbow fracture and RTC injury 6. LEFT HAND SURGERY 2001 - - ? tendon rupture 7.  Left SHOULDER SURGERY for RTC 8.  Left ANKLE SURGERY - fracture post fall 9. Cholecystectomy 1984 10. Umbilical hernia repair 2423  Family History: Reviewed history from 01/05/2009 and no changes required. Father: Dead Brain anuerysm 52 Mother: Dead Pulmonary fibrosis 85 Siblings: Sisters x 2 - one dead MVA age 64 and one 70 W&L Brothers - half only x 3 Kids - Boy age 75, daughter age 23  -healthy  Social History: Reviewed history from 02/08/2009 and no changes required. 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  Education:GED in 2005  Review of Systems       Chest Pain with deep inspiration, sharp right breast pain, transient. All other systems have been reviewed and are negative unless stated above.   Vital Signs:  Patient profile:   60 year old female Weight:      202 pounds O2 Sat:      96 % on Room air Pulse rate:   65 / minute BP sitting:   125 / 70  (right arm)  Vitals Entered By: Doretha Sou, CNA (August 22, 2010 1:00 PM)  O2 Flow:  Room air  Physical Exam  General:  Well developed, well nourished, in no acute distress. Lungs:  Expiratory wheezes in the upper airways.  No pain with deep inspiration at this time. Heart:  RRR with soft 1/6 systomlic murmur.  No carotid bruits.   Abdomen:  Obese, nontender, 23+ BS. Msk:  Back normal, normal gait. Muscle strength and tone normal. Extremities:  Absent right hand from work accident with tramatic amputation.  DP bilaterally 1+, bilateral radial pulses 1+ Neurologic:   Alert and oriented x 3. Skin:  Intact without lesions or rashes. Psych:  anxious.  States "I am under a lot of stress.  Don't want to talk about it."   EKG  Procedure date:  08/22/2010  Findings:      Normal sinus rhythm with rate of:  70 bpm  Impression & Recommendations:  Problem # 1:  CHEST PAIN (ICD-786.50) The chest discomfort appears atypical for cardiac etiology for pain.  She has multiple CVRF, but no objective data for ischemia.  Review of ER records does not demonstrate abnormal EKG, cardiac enzymes, or chest x-ray.  Vital signs are stable.  With history of COPD cannot rule out bronchospasms as cause of chest discomfort.  Will plan stress echo for evaluation of chest discomfort.  We may need to change to CCB for hypertension if she continues discomfort or has difficulty breathing from bronchospams.  Her updated medication list for this problem includes:    Aspir-low 89  Mg Tbec (Aspirin) ..... Once daily    Metoprolol Tartrate 50 Mg Tabs (Metoprolol tartrate) .Marland Kitchen... 1/2 two times a day    Nitroglycerin 0.4 Mg Subl (Nitroglycerin) .Marland Kitchen... As needed    Lisinopril 20 Mg Tabs (Lisinopril) ..... One daily    Nitrostat 0.4 Mg Subl (Nitroglycerin) .Marland Kitchen... 1 tablet under tongue at onset of chest pain; you may repeat every 5 minutes for up to 3 doses.  Orders: Stress Echo (Stress Echo)  Problem # 2:  HYPERTENSION (ICD-401.1) Continue ACE inhbitor in the setting of diabetes.  As stated above can consider CCB for BP control with history of COPD.  Further recommendations post stress echo. Her updated medication list for this problem includes:    Aspir-low 81 Mg Tbec (Aspirin) ..... Once daily    Metoprolol Tartrate 50 Mg Tabs (Metoprolol tartrate) .Marland Kitchen... 1/2 two times a day    Lisinopril 20 Mg Tabs (Lisinopril) ..... One daily  Problem # 3:  TOBACCO ABUSE (ICD-305.1) I have counseled her on smoking cessation is the setting of COPD.  She is not inclined to stop smoking at this  time.  Patient Instructions: 1)  Your physician recommends that you schedule a follow-up appointment in: after test 2)  Your physician has requested that you have an echocardiogram.   3)  Your physician discussed the hazards of tobacco use.  Tobacco use cessation is recommended and techniques and options to help you quit were discussed. 4)  [place] quit line is 1-800-QUIT-NOW 5)  Your physician recommended you take 1 tablet (or 1 spray) under tongue at onset of chest pain; you may repeat every 5 minutes for up to 3 doses. If 3 or more doses are required, call 911 and proceed to the ER immediately. 6)  Your physician has requested that you have a stress echocardiogram. For further information please visit [email or web address].  Please follow instruction sheet as given. Prescriptions: NITROSTAT 0.4 MG SUBL (NITROGLYCERIN) 1 tablet under tongue at onset of chest pain; you may repeat every 5 minutes for up to 3 doses.  #1 bottle x 0   Entered by:   Jeani Hawking Via LPN   Authorized by:   Jory Sims, NP   Signed by:   Jeani Hawking Via LPN on 71/24/5809   Method used:   Samples Given   RxID:   [id number]

## 2010-11-16 NOTE — Assessment & Plan Note (Signed)
Summary: FELL AND LIQUID COMING OUT ANUS/SS   Visit Type:  Follow-up Visit Primary Care Provider:  Dr. Lucia Gaskins  CC:  liquid coming out of anus.  History of Present Illness: [name] presents today with concerns of drainage from rectum. States she fell Saturday night while attempting to sit on a chair that wasn't stabilized. She c/o painful/burning rectum. Notes small amount of discharge that is "oozing, mucous" from rectum. Has worn a panty liner. No melena or hematochezia. Has 2 BMs daily, does experience discomfort with BM. Denies constipation. Denies abdominal pain. No N/V. Last TCS April 2010, normal, repeat in 5 years.   Current Medications (verified): 1)  Metformin Hcl 1000 Mg Tabs (Metformin Hcl) .... Two Times A Day 2)  Pravastatin Sodium 40 Mg Tabs (Pravastatin Sodium) .... One Daily 3)  Glyburide 5 Mg Tabs (Glyburide) .... Two Times A Day 4)  Aspir-Low 81 Mg Tbec (Aspirin) .... Once Daily 5)  Metoprolol Tartrate 50 Mg Tabs (Metoprolol Tartrate) .... 1/2 Two Times A Day 6)  Citalopram Hydrobromide 40 Mg Tabs (Citalopram Hydrobromide) .... One Daily 7)  Lisinopril 20 Mg Tabs (Lisinopril) .... One Daily 8)  Klonopin 1 Mg Tabs (Clonazepam) .... One By Mouth Twice Daily As Needed. 9)  Levothroid 300 Mcg Tabs (Levothyroxine Sodium) .... Take 1 Tab Daily 10)  Nexium 40 Mg  Pack (Esomeprazole Magnesium) .... Once Daily 11)  Fish Oil Concentrate 1000 Mg  Caps (Omega-3 Fatty Acids) .... Two Times A Day 12)  Niaspan 1000 Mg Cr-Tabs (Niacin (Antihyperlipidemic)) .... One Daily At Bedtime Per Dr. Lattie Haw 13)  Welchol 625 Mg  Tabs (Colesevelam Hcl) .... Three Two Times A Day 14)  Fannie Cream .... Apply To Breasts Two Times A Day As Needed 15)  Promethazine Hcl 25 Mg Tabs (Promethazine Hcl) .... One Every 6 Hours As Needed For Nausea 16)  Nitrostat 0.4 Mg Subl (Nitroglycerin) .Marland Kitchen.. 1 Tablet Under Tongue At Onset of Chest Pain; You May Repeat Every 5 Minutes For Up To 3 Doses. 17)   Qvar 40 Mcg/act Aers (Beclomethasone Dipropionate) .... As Directed  Allergies (verified): 1)  ! * Ivp Dye 2)  ! Cipro 3)  ! Pcn 4)  ! Betadine 5)  ! Neosporin 6)  ! * Tape  Past History:  Past Medical History: ASCVD: Acute MI in 1996 requiring BMS CX; DES to M1 in 2000; normal coronary angiography in 2004; normal EF Hyperlipidemia Hypothyroidism Tobacco abuse-1/2 pack per day COPD Depression Diabetes mellitus, type II    DVT, hx of Nephrolithiasis-stone extraction in 2006 Traumatic amputation of right hand Allergic rhinitis Cholelithiasis GERD/dysphasia Low back pain Peripheral neuropathy TCS April 2010: normal, repeat in 5 years  Review of Systems General:  Denies fever, chills, and anorexia. Eyes:  Denies blurring, irritation, and discharge. ENT:  Denies sore throat, hoarseness, and difficulty swallowing. CV:  Denies chest pains and dyspnea on exertion. Resp:  Denies dyspnea at rest and wheezing. GI:  Denies difficulty swallowing, pain on swallowing, nausea, abdominal pain, and constipation. GU:  Denies urinary burning and urinary incontinence. MS:  Denies joint pain / LOM, joint swelling, and joint stiffness. Derm:  Denies rash, itching, and dry skin. Neuro:  Denies weakness and frequent headaches. Psych:  Denies depression and anxiety. Endo:  Denies cold intolerance and heat intolerance.  Vital Signs:  Patient profile:   60 year old female Height:      66 inches Weight:      206 pounds BMI:     33.37  Temp:     98.7 degrees F oral Pulse rate:   72 / minute BP sitting:   138 / 82  (left arm) Cuff size:   regular  Vitals Entered By: Waldon Merl LPN (September 04, 9372 10:52 AM)  Physical Exam  General:  Well developed, well nourished, no acute distress. Lungs:  Clear throughout to auscultation. Heart:  Regular rate and rhythm; no murmurs, rubs,  or bruits. Abdomen:  normal bowel sounds, obese, without guarding, without rebound, and no hepatomegally or  splenomegaly.   Rectal:  no drainage noted, no external hemorrhoid noted.  Msk:  Symmetrical with no gross deformities. Normal posture. Extremities:  No clubbing, cyanosis, edema or deformities noted. Skin:  Intact without significant lesions or rashes. Psych:  Alert and cooperative. Normal mood and affect.  Impression & Recommendations:  Problem # 1:  RECTAL PAIN (ICD-34.18)  60 year old female with recent fall out of chair 2-3 days ago, landed on buttocks, now complaining of rectal discomfort. No visible hemorrhoids noted or discharge, yet states small amt of mucus oozes from rectum. Likely discomfort r/t trauma. could have possible internal hemorrhoids. TCS April 2010, normal.   Anusol suppositories Contact office if worsens or is not alleviated.  Orders: Est. Patient Level III (42876) Prescriptions: ANUSOL-HC 25 MG SUPP (HYDROCORTISONE ACETATE) 1 per rectum twice daily as needed  #30 x 0   Entered and Authorized by:   [name] Emperor NP   Signed by:   [name] Emperor NP on 09/04/2010   Method used:   Faxed to ...       [address] [phone]* (retail)       [address]       [place], [place]  [address]       Ph: [phone] or [phone]       Fax: [phone]   RxID:   [id number]

## 2010-11-16 NOTE — Progress Notes (Signed)
Summary: constipation  Phone Note Call from Patient Call back at Home Phone [phone]   Caller: Patient Summary of Call: pt called- she had a very bad case of constipation last week and caused her hemorroids to start bleeding. She has not had a bm all week. went to the pharmacy yesterday and got some suppositories and had a very small bm today. Wants to know if there is anything she should be doing. does she need to be seen? pt last seen in 3/10 and had tcs in 4/10.  please advise. Initial call taken by: Burnadette Peter LPN,  November 19, 4479 11:06 AM     Appended Document: constipation MiraLax 17 g orally twice daily until bm's occur. Then back off the MiraLax 17 g orally daily p.r.n. constipation. We'll be glad to see if needed.  Appended Document: constipation tried to call pt-[name]  Appended Document: constipation pt aware, will call back for appt if she doesnt get any better

## 2010-11-16 NOTE — Letter (Signed)
Summary: egd order  egd order   Imported By: Burnadette Peter LPN 37/54/3606 77:03:40  _____________________________________________________________________  External Attachment:    Type:   Image     Comment:   External Document

## 2010-11-17 ENCOUNTER — Encounter (HOSPITAL_COMMUNITY): Payer: Self-pay | Admitting: Psychiatry

## 2010-11-17 NOTE — Letter (Signed)
Summary: Historic Patient File  Historic Patient File   Imported By: Dierdre Harness 09/04/2010 11:11:31  _____________________________________________________________________  External Attachment:    Type:   Image     Comment:   External Document

## 2010-11-23 ENCOUNTER — Encounter (INDEPENDENT_AMBULATORY_CARE_PROVIDER_SITE_OTHER): Payer: 59 | Admitting: Psychiatry

## 2010-11-23 DIAGNOSIS — F3189 Other bipolar disorder: Secondary | ICD-10-CM

## 2010-11-27 ENCOUNTER — Other Ambulatory Visit (HOSPITAL_COMMUNITY): Payer: Self-pay | Admitting: Family Medicine

## 2010-11-27 ENCOUNTER — Encounter (HOSPITAL_COMMUNITY): Payer: Self-pay

## 2010-11-27 ENCOUNTER — Ambulatory Visit (HOSPITAL_COMMUNITY)
Admission: RE | Admit: 2010-11-27 | Discharge: 2010-11-27 | Disposition: A | Discharge status: Home / Self Care | Payer: Medicare Other | Source: Ambulatory Visit | Attending: Family Medicine | Admitting: Family Medicine

## 2010-11-27 DIAGNOSIS — M25559 Pain in unspecified hip: Secondary | ICD-10-CM | POA: Insufficient documentation

## 2010-11-27 DIAGNOSIS — W19XXXA Unspecified fall, initial encounter: Secondary | ICD-10-CM | POA: Insufficient documentation

## 2010-11-27 DIAGNOSIS — S79919A Unspecified injury of unspecified hip, initial encounter: Secondary | ICD-10-CM | POA: Insufficient documentation

## 2010-11-27 HISTORY — DX: Chronic obstructive pulmonary disease, unspecified: J44.9

## 2010-11-29 ENCOUNTER — Encounter (HOSPITAL_COMMUNITY): Payer: 59 | Admitting: Psychiatry

## 2010-12-04 ENCOUNTER — Encounter (HOSPITAL_COMMUNITY): Payer: 59 | Admitting: Psychiatry

## 2010-12-05 ENCOUNTER — Encounter (HOSPITAL_COMMUNITY): Payer: Self-pay | Admitting: Psychiatry

## 2010-12-12 ENCOUNTER — Other Ambulatory Visit (HOSPITAL_COMMUNITY): Payer: Self-pay | Admitting: Family Medicine

## 2010-12-12 DIAGNOSIS — M545 Low back pain: Secondary | ICD-10-CM

## 2010-12-15 ENCOUNTER — Ambulatory Visit (HOSPITAL_COMMUNITY)
Admission: RE | Admit: 2010-12-15 | Discharge: 2010-12-15 | Disposition: A | Discharge status: Home / Self Care | Payer: Medicare Other | Source: Ambulatory Visit | Attending: Family Medicine | Admitting: Family Medicine

## 2010-12-15 DIAGNOSIS — M5137 Other intervertebral disc degeneration, lumbosacral region: Secondary | ICD-10-CM | POA: Insufficient documentation

## 2010-12-15 DIAGNOSIS — M48061 Spinal stenosis, lumbar region without neurogenic claudication: Secondary | ICD-10-CM | POA: Insufficient documentation

## 2010-12-15 DIAGNOSIS — M545 Low back pain, unspecified: Secondary | ICD-10-CM | POA: Insufficient documentation

## 2010-12-15 DIAGNOSIS — M79609 Pain in unspecified limb: Secondary | ICD-10-CM | POA: Insufficient documentation

## 2010-12-15 DIAGNOSIS — M51379 Other intervertebral disc degeneration, lumbosacral region without mention of lumbar back pain or lower extremity pain: Secondary | ICD-10-CM | POA: Insufficient documentation

## 2010-12-19 ENCOUNTER — Encounter (INDEPENDENT_AMBULATORY_CARE_PROVIDER_SITE_OTHER): Payer: Medicare Other | Admitting: Psychiatry

## 2010-12-19 DIAGNOSIS — F39 Unspecified mood [affective] disorder: Secondary | ICD-10-CM

## 2010-12-20 ENCOUNTER — Encounter (INDEPENDENT_AMBULATORY_CARE_PROVIDER_SITE_OTHER): Payer: Medicare Other | Admitting: Psychiatry

## 2010-12-20 DIAGNOSIS — F39 Unspecified mood [affective] disorder: Secondary | ICD-10-CM

## 2010-12-26 LAB — BASIC METABOLIC PANEL
BUN: 11 mg/dL (ref 6–23)
Chloride: 102 mEq/L (ref 96–112)
Glucose, Bld: 271 mg/dL — ABNORMAL HIGH (ref 70–99)
Potassium: 3.8 mEq/L (ref 3.5–5.1)

## 2010-12-26 LAB — DIFFERENTIAL
Basophils Absolute: 0 10*3/uL (ref 0.0–0.1)
Basophils Relative: 0 % (ref 0–1)
Eosinophils Relative: 4 % (ref 0–5)
Monocytes Absolute: 0.9 10*3/uL (ref 0.1–1.0)

## 2010-12-26 LAB — CBC
HCT: 35 % — ABNORMAL LOW (ref 36.0–46.0)
MCHC: 33.1 g/dL (ref 30.0–36.0)
MCV: 98.7 fL (ref 78.0–100.0)
RDW: 14.5 % (ref 11.5–15.5)

## 2010-12-26 LAB — POCT CARDIAC MARKERS: Troponin i, poc: <0.05 ng/mL (ref 0.00–0.09)

## 2010-12-27 LAB — CBC
Hemoglobin: 11.5 g/dL — ABNORMAL LOW (ref 12.0–15.0)
MCH: 33.6 pg (ref 26.0–34.0)
MCV: 99.6 fL (ref 78.0–100.0)
RBC: 3.43 MIL/uL — ABNORMAL LOW (ref 3.87–5.11)

## 2010-12-27 LAB — POCT CARDIAC MARKERS
Myoglobin, poc: 65.6 ng/mL (ref 12–200)
Myoglobin, poc: 67.5 ng/mL (ref 12–200)

## 2010-12-27 LAB — DIFFERENTIAL
Eosinophils Absolute: 0.3 10*3/uL (ref 0.0–0.7)
Lymphs Abs: 3.3 10*3/uL (ref 0.7–4.0)
Monocytes Relative: 7 % (ref 3–12)
Neutrophils Relative %: 52 % (ref 43–77)

## 2010-12-29 LAB — GLUCOSE, CAPILLARY: Glucose-Capillary: 211 mg/dL — ABNORMAL HIGH (ref 70–99)

## 2011-01-01 LAB — CBC
MCHC: 34 g/dL (ref 30.0–36.0)
MCV: 102.8 fL — ABNORMAL HIGH (ref 78.0–100.0)
Platelets: 213 10*3/uL (ref 150–400)
RDW: 14.6 % (ref 11.5–15.5)
WBC: 6.2 10*3/uL (ref 4.0–10.5)

## 2011-01-01 LAB — DIFFERENTIAL
Basophils Absolute: 0 10*3/uL (ref 0.0–0.1)
Basophils Relative: 1 % (ref 0–1)
Eosinophils Absolute: 0.2 10*3/uL (ref 0.0–0.7)
Neutrophils Relative %: 38 % — ABNORMAL LOW (ref 43–77)

## 2011-01-01 LAB — BASIC METABOLIC PANEL
BUN: 6 mg/dL (ref 6–23)
CO2: 27 mEq/L (ref 19–32)
Chloride: 106 mEq/L (ref 96–112)
Creatinine, Ser: 0.74 mg/dL (ref 0.4–1.2)
Glucose, Bld: 95 mg/dL (ref 70–99)

## 2011-01-01 LAB — POCT CARDIAC MARKERS
CKMB, poc: <1 ng/mL — ABNORMAL LOW (ref 1.0–8.0)
Myoglobin, poc: 69.4 ng/mL (ref 12–200)
Troponin i, poc: <0.05 ng/mL (ref 0.00–0.09)

## 2011-01-03 ENCOUNTER — Encounter (INDEPENDENT_AMBULATORY_CARE_PROVIDER_SITE_OTHER): Payer: Medicare Other | Admitting: Psychiatry

## 2011-01-03 DIAGNOSIS — F39 Unspecified mood [affective] disorder: Secondary | ICD-10-CM

## 2011-01-03 LAB — VITAMIN B12: Vitamin B-12: 343 pg/mL (ref 211–911)

## 2011-01-03 LAB — IRON AND TIBC
Saturation Ratios: 18 % — ABNORMAL LOW (ref 20–55)
TIBC: 383 ug/dL (ref 250–470)
UIBC: 313 ug/dL

## 2011-01-03 LAB — DIFFERENTIAL
Basophils Absolute: 0.1 10*3/uL (ref 0.0–0.1)
Lymphocytes Relative: 38 % (ref 12–46)
Monocytes Absolute: 0.6 10*3/uL (ref 0.1–1.0)
Monocytes Relative: 7 % (ref 3–12)
Neutro Abs: 4.8 10*3/uL (ref 1.7–7.7)
Neutrophils Relative %: 52 % (ref 43–77)

## 2011-01-03 LAB — CBC
Hemoglobin: 13.4 g/dL (ref 12.0–15.0)
RBC: 3.82 MIL/uL — ABNORMAL LOW (ref 3.87–5.11)
RDW: 14.4 % (ref 11.5–15.5)

## 2011-01-03 LAB — RETICULOCYTES: Retic Ct Pct: 2.2 % (ref 0.4–3.1)

## 2011-01-09 ENCOUNTER — Encounter (HOSPITAL_COMMUNITY): Payer: Medicare Other | Admitting: Psychiatry

## 2011-01-24 LAB — GLUCOSE, CAPILLARY

## 2011-01-25 ENCOUNTER — Encounter (INDEPENDENT_AMBULATORY_CARE_PROVIDER_SITE_OTHER): Payer: Medicare Other | Admitting: Psychiatry

## 2011-01-25 DIAGNOSIS — F39 Unspecified mood [affective] disorder: Secondary | ICD-10-CM

## 2011-02-02 ENCOUNTER — Encounter (HOSPITAL_COMMUNITY): Payer: Medicare Other | Admitting: Psychiatry

## 2011-02-09 ENCOUNTER — Encounter (HOSPITAL_COMMUNITY): Payer: Medicare Other | Admitting: Psychiatry

## 2011-02-15 ENCOUNTER — Encounter (INDEPENDENT_AMBULATORY_CARE_PROVIDER_SITE_OTHER): Payer: Medicare Other | Admitting: Psychiatry

## 2011-02-15 DIAGNOSIS — F39 Unspecified mood [affective] disorder: Secondary | ICD-10-CM

## 2011-02-27 NOTE — Op Note (Signed)
NAMELYBERTI, THRUSH               ACCOUNT NO.:  [id number]   MEDICAL RECORD NO.:  [id number]          PATIENT TYPE:  AMB   LOCATION:  DAY                           FACILITY:  [facility]   PHYSICIAN:  R. Garfield Cornea, M.D. DATE OF BIRTH:  [birth year]   DATE OF PROCEDURE:  02/03/2009  DATE OF DISCHARGE:                               OPERATIVE REPORT   SURGEON:  R. Garfield Cornea, M.D.   INDICATIONS FOR PROCEDURE:  History of colonic polyps.  Last colonoscopy  was in 2007.  Her prep was marginal at that time.  She is having no  lower GI tract symptoms currently.  A colonoscopy is now being done as a  surveillance. The risks, benefits and limitations have been discussed  and questions answered.  Please see the medical record.   PROCEDURE NOTE:  Satisfactory blood pressure, pulse and respirations  were monitored throughout entire procedure.  Conscious sedation with  Versed 6 mg IV and Demerol 125 mg IV in divided doses.  The instrument  was the  Pentax video chip system.   FINDINGS:  A digital rectal exam revealed no abnormalities.  The prep  was adequate.   Colon:  The colonic mucosa was surveyed from the rectosigmoid junction  through the left transverse and right colon, the appendiceal orifice,  ileocecal valve and cecum.  These structures were well seen photographed  for the record.  From this level the scope was slowly cautiously  withdrawn.  All previously-mentioned mucosal surfaces were again seen.  The colonic mucosa appeared normal.  The scope was pulled back to the  rectum.  The rectal mucosa including a retroflexed view of the anal  verge demonstrated no abnormalities.   The patient tolerated procedure well as is reactive in endoscopy.   IMPRESSION:  Normal rectum.   RECOMMENDATIONS:  Repeat colonoscopy in 5 years' time.   ADDENDUM:  Cecal withdrawal time was 7 minutes.      Bridgette Habermann, M.D.  Electronically Signed    RMR/MEDQ  D:  02/03/2009  T:  02/03/2009   Job:  [id number]   cc:   [name], M.D.

## 2011-02-27 NOTE — H&P (Signed)
NAMEREILYN, NELSON               ACCOUNT NO.:  [id number]   MEDICAL RECORD NO.:  [id number]          PATIENT TYPE:  OUT   LOCATION:  RAD                           FACILITY:  [facility]   PHYSICIAN:  R. Garfield Cornea, M.D. DATE OF BIRTH:  1951/05/17   DATE OF ADMISSION:  04/22/2008  DATE OF DISCHARGE:  07/09/2009LH                              HISTORY & PHYSICAL   CHIEF COMPLAINT:  Esophageal dysphagia and history of gastroesophageal  reflux disease symptoms.   Ms. [name] was last seen by [place] on March 19, 2008, to follow up her chronic  diarrhea in the setting of diabetes.  She has been extensively  evaluated.  She had multiple adenomas removed from her colon at that  time.  She is due for surveillance in 2010.  We tried some Questran  previously.  It helped her diarrhea, but it made her sick.  Celiac  antibody panel came back negative.  Biopsies negative for microscopic  colitis.  Stool studies came back negative.  It is interesting to note  that she was started on some WelChol for cholesterol.  It has been well  tolerated and has been associated with marked improvement in her  diarrhea.   Her new problem today is over the past few months, she has noted  progressive esophageal dysphagia to solids and pills difficulty she has  never had previously.  Her reflux symptoms are controlled on Nexium 40  mg orally daily.  There is no history of alcohol intake.  She has been a  light cigarette smoker.   PAST MEDICAL HISTORY:  Gastroesophageal reflux disease, coronary artery  disease, status post MI and stent in 1996.   PAST SURGERIES:  Traumatic right hand amputation related to accident,  carpal tunnel surgery, neurological procedure for kidney stones, partial  hysterectomy and subsequent oophorectomy, and cholecystectomy at [facility].   CURRENT MEDICATIONS:  1. Aspirin 81 mg daily.  2. Metoprolol 50 mg one-half tablet b.i.d.  3. Metformin 500 mg daily.  4. Bentyl 10 mg t.i.d.  5.  Clonazepam 1 mg p.r.n.  6. Glyburide 5 mg 2 tablets daily.  7. Lisinopril 25 mg daily.  8. Nexium 40 mg daily.  9. Synthroid 100 mcg daily.  10.Pravachol 40 mg daily.  11.Celexa 40 mg daily.  12.Nitroglycerin p.r.n.  13.Proventil  p.r.n.  14.Fish oil b.i.d.  15.Doxycycline 100 mg b.i.d.  16.WelChol daily.   ALLERGIES:  PENICILLIN, CIPRO, BETADINE, TAPE, IVP DYE, and NEOSPORIN.   FAMILY HISTORY:  Father has a history of cirrhosis, was a recalcitrant  alcoholic.  Otherwise, no history of chronic GI or liver illness.   SOCIAL HISTORY:  The patient is widowed.  She has been a Psychologist, occupational at  [employer].  She smokes 8-9 cigarettes per day.  No alcohol.   REVIEW OF SYSTEMS:  No chest pain.  No dyspnea on exertion.  No fever or  chills.  No melena or hematochezia.   PHYSICAL EXAMINATION:  GENERAL:  Pleasant 60 year old lady resting  comfortably.  VITAL SIGNS:  Weight 185, height 5 feet 6-1/2 inches, temperature 97.9,  BP  118/70, and pulse 72.  SKIN:  Warm and dry.  HEENT:  No scleral icterus.  CHEST:  Lungs are clear to auscultation.  CARDIAC:  Regular rate and rhythm without murmur, gallop, or rub.  ABDOMEN:  Nondistended, positive bowel sounds, soft, nontender without  appreciable mass or organomegaly.  EXTREMITIES:  No edema.   IMPRESSION:  Ms. Sheridan Hew is a very pleasant 60 year old lady with  relatively new-onset dysphagia to pills and solid food going back a  couple of months and background history of longstanding gastroesophageal  reflux disease.  This symptom demands further evaluation.  I do note she  has been taking doxycycline recently, which may or may not be  significant factor in her dysphagia symptoms.  She has a history of  colonic adenoma and is due for surveillance colonoscopy next year.  She  has a separate issue.  I am glad to see her diarrhea is much better with  WelChol.   RECOMMENDATIONS:  We will proceed with an EGD with esophageal dilation   as appropriate in the very near future at [facility].  Risks,  benefits, and alternatives have been reviewed and questions answered.  She is agreeable to make further recommendations soon.      Bridgette Habermann, M.D.  Electronically Signed     RMR/MEDQ  D:  07/01/2008  T:  07/02/2008  Job:  [id number]   cc:   [name], M.D.

## 2011-02-27 NOTE — Assessment & Plan Note (Signed)
[name], COWMAN                CHART#:  [id number]   DATE:  03/19/2008                       DOB:  November 30, 1950   Followup diarrhea.  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  She had a  colonoscopy in 2007, which demonstrated multiple polyps, hyperplastic,  and adenoma.  She is due for surveillance in 2010.  We had tried her on  some Questran, although it slowed her diarrhea significantly, it made  her sick, and she cannot stand the texture and taste of Questran.  Celiac antibody panel in the interim came back negative.  She has been  on dicyclomine to combat diarrhea more recently.  Dr. Doy Hutching saw her  recently and did some stool studies, and her lactoferrin came back  positive.  She tells me she is having 6 plus bowel movements daily.  She  occasionally has some nighttime diarrhea and some incontinence.  She has  not had any blood per rectum.  No upper GI tract symptoms such as nausea  or vomiting.  She has lost 7 pounds since last year.  Reflux symptoms  are well controlled on Nexium 40 mg orally daily.   CURRENT MEDICATIONS:  See updated list.   ALLERGIES:  1. Penicillin.  2. Cipro.  3. Betadine.  4. Tape.  5. IVP dye.  6. Neosporin.   PHYSICAL EXAMINATION:  GENERAL:  A pleasant 60 year old lady, resting  comfortably.  VITAL SIGNS:  Weight 191, height 5 feet 6-1/2 inches, temperature 98.9,  BP 128/88, and pulse 60.  SKIN:  Warm and dry.  CHEST:  Lungs are clear to auscultation.  CARDIAC:  Regular rate and rhythm without murmur, gallop, or rub.  ABDOMEN:  Obese, positive bowel sounds, soft, nontender, without  appreciable mass or splenomegaly.  EXTREMITIES:  No edema.  RECTAL:  Scant brown stool in the rectal vault.  No mass.  Stool is  Hemoccult negative.  This lady has surprisingly good sphincter tone and  a good voluntary  squeeze.   ASSESSMENT:  Ongoing problems with nonbloody diarrhea.  She had a  positive lactoferrin recently, but I am not sure if she had any other  stool studies done.  Diarrhea really has not changed all that much in  the past several months.  She did get a nice response with Questran  previously, but cannot tolerate this agent.   RECOMMENDATIONS:  At this point, we will go ahead and simply send off  another stool for lactoferrin and check a C. diff toxin assay, and see  where we stand.  She is to continue Bentyl 10 mg t.i.d. for the time  being.  We will make further recommendations in the very near future.   ADDENDUM:  I do not see any need at this point to move up timing of  colonoscopy, although if she has a positive lactoferrin, we will need to  consider the possibility of microscopic colitis, and may need to change  plans.       Bridgette Habermann, M.D.  Electronically Signed     RMR/MEDQ  D:  03/25/2008  T:  03/26/2008  Job:  [id number]   cc:   Barnett Hatter

## 2011-02-27 NOTE — Op Note (Signed)
[name], [name]               ACCOUNT NO.:  [id number]   MEDICAL RECORD NO.:  [id number]          PATIENT TYPE:  AMB   LOCATION:  DAY                           FACILITY:  [facility]   PHYSICIAN:  R. Garfield Cornea, M.D. DATE OF BIRTH:  [birth year]   DATE OF PROCEDURE:  07/02/2008  DATE OF DISCHARGE:                               OPERATIVE REPORT   Esophagogastroduodenoscopy with Venia Minks dilation.   INDICATIONS FOR PROCEDURE:  A 60 year old lady with relatively new-onset  esophageal dysphagia to pills and solids going back a couple of months,  in a background of long-standing gastroesophageal reflux disease.  EGD  is being done to further evaluate her symptoms.  Procedure for  esophageal dilatation was reviewed.  Risks, benefits, alternatives, and  limitations have been discussed.  Please see documentation in the  medical record.   PROCEDURE NOTE:  O2 saturation, blood pressure, pulse, and respirations  were monitored throughout the entire procedure.   CONSCIOUS SEDATION:  Versed 5 mg IV and Demerol 125 mg IV in divided  doses.  Cetacaine spray for topical oropharyngeal anesthesia.   FINDINGS:  Examination of tubular esophagus revealed a subtle Schatzki  ring, otherwise esophageal mucosa appeared unremarkable.  EG junction  easily traversed.   Stomach:  Gastric cavity was emptied and insufflated well with air.  A  thorough examination of the gastric mucosa including retroflexed view of  the proximal stomach of the esophagogastric junction demonstrated only a  small hiatal hernia.  Pylorus is patent, easily traversed.  Examination  of the bulb and the second portion revealed no abnormalities.   THERAPEUTIC/DIAGNOSTIC MANEUVERS PERFORMED:  The scope was withdrawn, a  56-French Maloney dilator was passed to full insertion with ease.  A  look back revealed no apparent complication related to passage of the  dilator.  The patient tolerated the procedure well and was reactive in  Endoscopy.   IMPRESSION:  1. Subtle Schatzki ring, otherwise normal esophagus, status post      passage of a 56-French Maloney dilator.  2. Small hiatal hernia, otherwise normal stomach. D1 and D2.   RECOMMENDATIONS:  1. Continue Nexium 40 mg orally daily. Note that she has been taking      doxycycline recently.  I admonished her the usual swallowing      precautions with taking this agent.  2. Followup appointment with [place] in 6 months.      Bridgette Habermann, M.D.  Electronically Signed     RMR/MEDQ  D:  07/02/2008  T:  07/03/2008  Job:  [id number]

## 2011-02-27 NOTE — Assessment & Plan Note (Signed)
[name], [name]                CHART#:  [id number]   DATE:  06/09/2007                       DOB:  04/28/1951   CHIEF COMPLAINT:  Follow up altered bowel habits.   SUBJECTIVE:  The patient is a 60 year old female who was last seen by  Dr. Gala Romney back in October of 2007.  She has a history of diarrhea,  intermittent fecal incontinence suspected to be due to diabetic visceral  enteropathy with an element of IBS.  She had a colonoscopy in April of  2007 where she was found to have tubular adenoma and an otherwise normal  exam as well as biopsies to rule out microscopic colitis.  She tells me  she has been doing well.  She occasionally has episodes of fecal  incontinence.  She denies any rectal bleeding or melena.  She can have  anywhere from 3-6 bowel movements a day.  She has been taking Bentyl 10  mg t.i.d. as needed for diarrhea which does seem to help.  However, she  has ran out of this medication.  She has tried Imodium previously.  She  denies any abdominal pain at this point.  She tells me her blood sugars  have been well controlled recently.   CURRENT MEDICATIONS:  See the list from 06/09/07.   ALLERGIES:  PENICILLIN, CIPRO, BETADINE, TAPE, IVP DYE, AND NEOSPORIN.   OBJECTIVE:  VITAL SIGNS:  Weight 198 pounds, height 66.5 inches,  temperature 98.5, blood pressure 128/92, pulse of 86.  GENERAL:  The patient is a 60 year old Caucasian female who is well  developed, well nourished, in no acute distress.  HEENT:  Sclerae clear, nonicteric, conjunctivae pink.  Oropharynx pink  and moist without any lesions.  CHEST:  Heart regular rate and rhythm, normal S1, S2, without any  murmurs, rubs, clicks or gallops.  ABDOMEN:  Positive bowel sounds times 4, no bruits auscultated, soft,  nontender, and nondistended without palpable mass or hepatosplenomegaly.  EXTREMITIES:  Without clubbing or edema bilaterally.  SKIN:  Pink, warm, and dry without any rashes or jaundice.    ASSESSMENT:  1. Chronic diarrhea with diabetic visceral enteropathy/IBS.  2. She has a history of adenomatous colonic polyps.   PLAN:  1. She can add Benefiber gradually and I have given her samples today.  2. Refill Bentyl 10 mg t.i.d. p.r.n.  3. Check a celiac antibody panel.  4. IBS literature given for her review.  5. Office visit 1 year.  6. Colonoscopy 02/01/09 to follow up adenomatous polyps.       Vickey Huger, N.P.  Electronically Signed     R. Garfield Cornea, M.D.  Electronically Signed    KJ/MEDQ  D:  06/09/2007  T:  06/10/2007  Job:  [id number]   cc:   [name], M.D.

## 2011-02-27 NOTE — Op Note (Signed)
[name], BRASSFIELD NO.:  [id number]   MEDICAL RECORD NO.:  [id number]          PATIENT TYPE:  OBV   LOCATION:  A304                          FACILITY:  [facility]   PHYSICIAN:  Felicie Morn, M.D. DATE OF BIRTH:  27-Sep-1951   DATE OF PROCEDURE:  07/16/2007  DATE OF DISCHARGE:                               OPERATIVE REPORT   SURGEON:  Felicie Morn, M.D.   PREOPERATIVE DIAGNOSIS:  Umbilical hernia.   POSTOPERATIVE DIAGNOSIS:  Umbilical hernia.   PROCEDURE:  Umbilical herniorrhaphy without the use of mesh.   INDICATIONS FOR PROCEDURE:  This is a 60 year old white female who had  discomfort in the area of the umbilicus that was increasing.  She  clinically did not have that impressive an exam to the extent that I  obtained a CT scan to make sure that there was not something else going  on.  The CT scan did not show any obvious hernia in this area other than  a fuzziness but the patient had pinpoint tenderness and a mass-like  effect in the umbilicus.  This had not abated and we then scheduled her  for surgery, discussing complications not limited to, but including  bleeding, infection and possibility of recurrence.  Informed consent was  obtained.   GROSS OPERATIVE FINDINGS:  A lot of scar tissue around this area of the  umbilicus.  She also had a small inguinal hernia that was not  appreciated on the CT scan and there was a small amount of fat that was  incarcerated in this area.  We opened the defect and checked to make  sure that there was nothing around the rim of the fascia and there were  no other abnormalities.   TECHNIQUE:  The patient was placed in supine position.  After the  adequate administration of general anesthesia via endotracheal  intubation, her abdomen was prepped with Techni-Care and she was prepped  and draped in the usual manner.  A transverse incision was carried out  over the inferior aspect of the umbilicus in the area of her  previous  scar in this area where she had had tubal ligation surgery.  We  inspected to make sure that there were no other abnormalities in this  area carefully opening the umbilical hernia defect and then closing it  with 0 Prolene sutures in interrupted fashion.  I did not use any  prosthetic device to close this, this was not necessary.  After  repairing the hernia and irrigating with normal saline solution, I then  closed the subcutaneous area with 3-0 Polysorb  and the skin with stapling device.  Sterile dressing was applied.  Prior  to closure, all sponge, needle and instrument counts were found to be  correct.  Estimated blood loss was minimal.  The patient received a  liter of crystalloids intraoperatively.  No drains were placed and there  were no complications.      Felicie Morn, M.D.  Electronically Signed     WB/MEDQ  D:  07/16/2007  T:  07/16/2007  Job:  [id number]   cc:  [name], M.D.

## 2011-02-27 NOTE — Letter (Signed)
June 03, 2008    [name], MD  [address], [place]  [place], [place]   RE:  [name] [name], [name]  MRN:  [id number]  /  DOB:  [birth year]   Dear [name] [name] [name]:   [name] [name] [name] was seen in the office today at [name] [name] [name] request.  I last saw [name] [name]  nice [name] nearly a year ago when preparations were being made for  periumbilical hernia repair.  [name] [name] procedure was apparently successfully  performed without complications by Dr. Romona Curls soon thereafter.  She  has done well since then under your care.  She seeks evaluation today  for review of [name] [name] [name] medication in the determination of which of [name] [name] [name] drugs  can be discontinued.   She does have a history of coronary disease, albeit remote, having  undergone percutaneous intervention for an acute myocardial infarction  involving the obtuse marginal branch of the circumflex coronary artery  in 1996.  She has done well since then from a cardiac standpoint even  though she has continued to smoke cigarettes, most recently one-half  pack per day.  She has not stopped smoking for any significant period of  time.  She questions [name] [name] [name] ability to do so.   She has additional cardiovascular risk factors, most notably diabetes  and dyslipidemia.   Current medications include:  1. Aspirin 81 mg daily.  2. Metoprolol 25 mg b.i.d.  3. Levothyroxine 0.1 mg daily.  4. Nexium 40 mg daily.  5. Metformin 1000 mg b.i.d.  6. Pravastatin 40 mg daily.  7. Celexa 40 mg daily.  8. Lisinopril 2.5 mg daily.  9. Klonopin 1 mg b.i.d.  10.Januvia 100 mg daily.  11.Fish oil 2000 mg daily.  12.Bentyl 10 mg a.c. in bedtime.  13.Advair 250/50 two puffs daily.  14.Niaspan 500 mg daily.  15.Naprosyn 500 mg b.i.d.  16.WelChol 625 mg b.i.d.  17.She also uses an albuterol inhaler, nitroglycerin, and Lortab      p.r.n.   PHYSICAL EXAMINATION:  GENERAL:  On exam, pleasant [name] in no acute  distress.  VITAL SIGNS:  The weight is 185 pounds, 13 pounds  less than 1 year ago.  Blood pressure 125/60, heart rate 66 and regular, and respirations 14.  NECK:  No jugular venous distention; normal carotid upstrokes without  bruits.  LUNGS:  Few inspiratory and expiratory rhonchi; some prolongation of the  expiratory phase.  CARDIAC:  Normal first and second heart sounds.  ABDOMEN:  Soft and nontender; no organomegaly.  EXTREMITIES:  Normal distal pulses; no edema.   IMPRESSION:  I carefully reviewed all medications with [name] [name] [name].  The  elective drugs are those who are primarily being administered for  symptomatic relief.  She notes benefit from Celexa, Klonopin, Proventil,  Bentyl, and WelChol (in terms of reduced frequency of loose stools).  I  advised [name] [name] [name] that those medicine should therefore be continued.  [name] [name] [name]  medications for diabetes and hyperlipidemia are in general excellent.  I  suggested that an increase in [name] [name] [name] dose of Niaspan might provide benefit  as well as an increase in [name] [name] [name] dose of lisinopril to 10 mg daily.  She  accepted these recommendations, and we will not discontinue any of [name] [name] [name]  medications.  I will plan to see [name] [name] [name] again in 1 year.  We  will delay the laboratory studies that you ordered for [name] [name] week for few  weeks until the changes in [name] [name] [name] medication take effect.   Thank so much for allowing me  to follow [name] [name] [name] with you.    Sincerely,      Cristopher Estimable. Lattie Haw, MD, [facility]  Electronically Signed    RMR/MedQ  DD: 06/03/2008  DT: 06/04/2008  Job #: [id number]

## 2011-02-27 NOTE — Letter (Signed)
July 09, 2007    [name] Morn, MD  [place] [address]  [place], [place] [address]   RE:  [name], [name]  MRN:  [id number]  /  DOB:  24-May-1951   Dr. Artis Delay:   It is my pleasure evaluating [name] (previously [place]) in the  office today in consultation at your request. This nice woman was  previously followed by Dr. Verl Blalock but was lost to followup after he moved  his practice to [place]. Her cardiac history dates back to 1996 when  she required percutaneous intervention for coronary disease. I do not  have detail record from that admission. She has had a number of  subsequent admissions and evaluation for recurrent chest discomfort.  Cardiac catheterization in 1999 and again in 2004 revealed no  significant recurrent obstructive disease. She has felt well in recent  months with no chest pain and no dyspnea. She occasionally experiences  episodes of wheezing for which she uses an inhaler. Unfortunately, she  continues to smoke cigarettes, albeit at the reduced rate of  approximately one-half pack per day. She recently quit cigarettes with  the assistance of Chantix but resumed smoking as soon as she stopped  taking that medication. She is now found to have small umbilical hernia  with plans for surgical repair.   PAST MEDICAL HISTORY:  Is otherwise notable for:  1. Dyslipidemia.  2. Prior hysterectomy.  3. Prior left oophorectomy.  4. Remote appendectomy.  5. Subsequent right oophorectomy.  6. 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   MULTIPLE ALLERGIES ARE REPORTED INCLUDING PENICILLIN, CIPROFLOXACIN,  BETADINE, PLASTIC TAPE AND NEOSPORIN.   She has also had a history of nephrolithiasis requiring cystoscopy in  2006. Her most recent cardiac study was an echocardiogram in January of  this year. She had no significant valvular abnormalities. Left  ventricular size and function were normal.   Family history, past  medical and social history and review of systems  were updated. The patient has had GERD. She was hospitalized for  pneumonia in 2001. A stress Myoview study in September of 2006 was  negative. Recent lipid profile was good.   CURRENT MEDICATIONS:  Include:  1. Aspirin 81 mg daily.  2. Zoloft 50 mg b.i.d.  3. Metoprolol 25 mg b.i.d.  4. Levothyroxine 0.25 mg daily.  5. Nexium 40 mg daily.  6. Metformin a total of 1500 mg per day in divided doses.  7. Glipizide 5 mg daily.  8. Vytorin - dosage unknown.   On exam, pleasant woman with a raspy voice who is somewhat overweight  and in no acute distress. Weight is 198. Blood pressure 125/70, heart  rate 60 and regular, respirations 14.  HEENT:  Anicteric sclerae. EOMs full.  NECK:  No jugular venous distention; no carotid bruits.  LUNGS:  Inspiratory and expiratory rhonchi; prolonged expiratory phase;  no wheeze or rales.  CARDIAC:  Normal first and second heart sounds.  ABDOMEN:  Soft and nontender. No organomegaly.  EXTREMITIES:  Distal pulses intact; no edema.   EKG:  Sinus bradycardia; borderline left atrial abnormalities; slightly  delayed RV progression; otherwise normal.   IMPRESSION:  [name] is doing well with respect to coronary disease  and cardiovascular risk factors except for continued tobacco  consumption. If she is going to relapse after not smoking for three  months, the likelihood of prolonged cessation of tobacco use appears  small.  Nonetheless,  she was encouraged to attempt to quit again. As far as the  minimal risk surgery that is planned, her cardiac risk is extremely  small. She should continue her beta blocker and aspirin perioperatively.  Please let me if I can assist in her care when she is hospitalized.    Sincerely,      Cristopher Estimable. Lattie Haw, MD, [facility]  Electronically Signed    RMR/MedQ  DD: 07/09/2007  DT: 07/10/2007  Job #: [id number]   CC:    [name], M.D.

## 2011-03-02 NOTE — Op Note (Signed)
NAMEDANAYE, SOBH             ACCOUNT NO.:  [id number]   MEDICAL RECORD NO.:  [id number]          PATIENT TYPE:  AMB   LOCATION:  DAY                           FACILITY:  [facility]   PHYSICIAN:  Miguel Dibble, M.D.   DATE OF BIRTH:  May 29, 1951   DATE OF PROCEDURE:  02/26/2005  DATE OF DISCHARGE:                                 OPERATIVE REPORT   PREOPERATIVE DIAGNOSES:  1.  Bilateral distal ureteral calculi with obstruction.  2.  Bilateral flank pain.  3.  Bilateral hydronephrosis.   POSTOPERATIVE DIAGNOSES:  1.  Right distal ureteral calculi with obstruction.  2.  Left distal ureteral calculus (passed).   OPERATIVE PROCEDURE:  1.  Cystoscopy.  2.  Bilateral retrograde pyelograms.  3.  Right ureteroscopic stone extraction.  4.  Left ureteroscopy.  5.  Bilateral ureteral stent placement.   ANESTHESIA:  General.   SURGEON:  Miguel Dibble, M.D.   COMPLICATIONS:  None.   DRAINS:  1.  [place], 26 cm size ureteral stents x2 (string stents).   INDICATIONS FOR PROCEDURE:  This 60 year old female is evaluated for  bilateral flank pain.  She had two right distal ureteral calculi with  obstruction.  There was a 3 mm size left distal ureteral calculus with  obstruction.  The patient was unable to pass the stones.  She was taken to  the OR today for cystoscopy, bilateral retrograde pyelograms, bilateral  ureteroscopic stone extraction and stent placement.   DESCRIPTION OF PROCEDURE:  General anesthesia was induced and the patient  was placed on the OR table in the dorsal lithotomy position.  The lower  abdomen and genitalia were prepped and draped in a sterile fashion.  Cystoscopy was done with a  25-French scope.  The appearance of the bladder  was normal.  A 5-French wedge catheter was then placed in the right ureteral  orifice.  Renografin 7 mL 60 was injected to the collecting system and the  retrograde pyelogram was done by using C-arm fluoroscopy.  There were two  filling  defects about 5 cm above the ureteral orifice suggestive of stones.  The 5-French open-ended catheter was then placed in the right ureteral  orifice.  A 0.038-gauge Benson guidewire with a flexible tip was then  advanced to the renal pelvis.  The distal ureter was dilated using an 72-  French, 10 cm size balloon dilating catheter.  The balloon dilating catheter  was then removed leaving the guidewire in place.   The 7.5 French rigid ureteroscope was then inserted into the right distal  ureter.  The stones were seen and removed with the nitinol four-wire basket.  Inspection of the ureter was done up to the pelvic brim and no other  abnormality was noted.  A 6 x 26 cm size stent with a string was then  inserted into the right collecting system.   A left retrograde pyelogram was done using 7 mL of Renografin 60.  There was  no definite filling defect in the distal ureter.  There was some  irregularity and dilation of the distal ureter.  A 5-French open-ended  catheter  was then placed in the left ureteral orifice.  A 0.038-gauge Benson  guide with a flexible tip was then inserted into the left renal pelvis.  The  open-ended catheter was then removed.  Inspection of the left distal ureter  was done and no stone was noted.  The instruments were removed.  A 6 French,  26 cm size stent was then inserted on the left side.  The instruments were  removed.  The patient was transferred to the PACU in a satisfactory  condition.  The stones removed from the right distal ureter was sent for  chemical analysis.      SK/MEDQ  D:  02/26/2005  T:  02/26/2005  Job:  [id number]   cc:   Florian Buff, M.D.  [address], [place]  [place] [address]  Fax: [place] Luan Pulling, M.D.  [place]  [place] [address]  Fax: [phone]

## 2011-03-02 NOTE — Procedures (Signed)
[name], [name] NO.:  [id number]   MEDICAL RECORD NO.:  [id number]          PATIENT TYPE:  OUT   LOCATION:  RAD                           FACILITY:  [facility]   PHYSICIAN:  Cristopher Estimable. Lattie Haw, MD, FACCDATE OF BIRTH:  Jan 19, 1951   DATE OF PROCEDURE:  10/16/2006  DATE OF DISCHARGE:                                ECHOCARDIOGRAM   REFERRING:  Dr. Jonna Munro.   CLINICAL DATA:  A 60 year old woman with syncope, chest pain,  hypertension, diabetes and known coronary disease with prior stent  implantation.  M-mode aorta 3.0, left atrium 4.0, septum 1.3, posterior  wall 1.1, LV diastole 4.7, LV systole 3.8.   1. Technically adequate echocardiographic study.  2. Normal left atrium, right atrium and right ventricle.  3. Normal normal mitral valve; mild to moderate annular calcification.  4. Normal aortic valve; mild calcification of the aortic annulus.  5. Normal mitral and tricuspid valves; normal proximal pulmonary      artery.  6. Normal diameter of the ascending aorta.  7. Normal left ventricular size; borderline hypertrophy; normal      regional and global function.  8. IVC not well imaged; appears grossly normal.      Cristopher Estimable. Lattie Haw, MD, [facility]  Electronically Signed     RMR/MEDQ  D:  10/16/2006  T:  10/16/2006  Job:  [id number]

## 2011-03-02 NOTE — Op Note (Signed)
NAME:  [name], [name]                       ACCOUNT NO.:  [id number]   MEDICAL RECORD NO.:  [id number]                   PATIENT TYPE:  AMB   LOCATION:  DAY                                  FACILITY:  [facility]   PHYSICIAN:  Jonnie Kind, M.D.              DATE OF BIRTH:  15-Mar-1951   DATE OF PROCEDURE:  DATE OF DISCHARGE:                                 OPERATIVE REPORT   PREOPERATIVE DIAGNOSES:  Right lower quadrant pain.  Suspected right adnexal  adhesions to ovary.   POSTOPERATIVE DIAGNOSES:  Right lower quadrant pain.  Suspected right  adnexal adhesions to ovary.   PROCEDURE:  Failed laparoscopic right salpingo-oophorectomy.  Lysis of  adhesions.  Laparotomy with right salpingo-oophorectomy.   SURGEON:  Jonnie Kind, M.D.   ASSISTANT:  Loreli Slot, [place].   ANESTHESIA:  General, [place].   COMPLICATIONS:  None.   FINDINGS:  Extensive adhesions from the omentum to the anterior abdominal  wall at the site of the prior laparotomy, able to worked around but also  extensive adhesions from the ovary to the vaginal cuff and to the right  pelvic side wall, unable to be resected due to technical limitations of his  ability.  Extensive omental and epiploic fat appendages interfering with  visibility.   DETAILS OF PROCEDURE:  The patient was taken to the operating room and  prepped and draped for combined abdominal and vaginal procedure with the  legs supported in the yellow fin leg supports and the low lithotomy  position.  A Foley catheter was inserted, and a single-tooth tenaculum was  attached to the cervix.  Abdominal and vaginal prepping was performed with  an agent not containing any Betadine and then the abdomen cleansed. Direct  prepping and draping followed.  The Foley catheter was in place.  Attention  was directed to the umbilicus where the Veress needle was used to achieve  pneumoperitoneum under 8-11 mm of pressure.  Water droplet technique was  used to  identify infraperitoneal location, achieved on first attempt.  Pneumoperitoneum with 3 liters of CO2 was followed by blunt laparoscopically  guided trocar insertion, which identified the peritoneal cavity without  difficulty.  We were just above some omental adhesions to the anterior  abdominal wall.  The bowel and transverse colon was well out of the way,  much higher.  We could see around the right side of the midline subumbilical  adhesions and also around the left side.  The adhesion band was  approximately 4 cm in width but could be dissected free, particularly on its  lower one-third.  A right lower quadrant 5 mm trocar was inserted, as well  as a suprapubic 12 mm trocar, once orientation was complete.  A Foley  catheter was identifiable and vaginal cuff mobile.  We then proceeded to  dissect free some of the anterior abdominal wall adhesions to allow improved  visibility on the right  side of the midline.  The sigmoid colon was  redundant and rested against the right side wall but could be mobilized  easily.  It was not adherent.  Conversely, the left side had extensive  adhesions in the right and left lower quadrant where prior salpingo-  oophorectomy had been performed.   Once enough of omental adhesions had been released free, we could operate  with the three ports, and we proceeded to dissect free the right lower  quadrant.  The tube and ovary were extensively adherent and thin, filmy  adhesions to the side wall, and these were dissectable.  It was apparent  that a fourth trocar site was necessary, and so we went back and freed up  adhesions further on the left side of the midline so that an inferior window  could be achieved to reach across from a 5 mm trocar placed in the left  lower quadrant.  The right lower quadrant 5 mm trocar was replaced with a 12  mm operative trocar.  This was performed without difficulty under direct  visualization.  We then proceeded with elevation of  the tube and ovary,  dissecting beneath it.  We were unable to clearly get the ovary free from  the vaginal cuff, despite efforts to manipulate the cuff into better view  with the combination of epiploic fat and a tendency to ooze interfered  surgery.  We then decided to attempt a retroperitoneal approach to the tube  and ovary.  The lateral triangle defined by the infundibulopelvic ligament,  iliac veins, iliac vessels, and round ligament were identified.  The round  ligament was elevated, and a window made in the retroperitoneal space  lateral to the infundibulopelvic ligament. There were some dense adhesions  in this area that did not dissect free easily.  We went up higher and tried  to mobilize the IP ligament, but the degree of bleeding was excessive to  continue.  It was necessary to convert a laparotomy procedure.  We were  ___________ to considering this, but proceeded and approximately 1 hour and  45 minutes of surgery.  Conversion to laparotomy was then performed using  the same draping with legs remaining in the low lithotomy yellow fin  support.  Once counts were correct and instruments converted, a Pfannenstiel-  type incision was performed just above the panniculus crease, removing  approximately 12 cm of length.  Sharp dissection to the fascia was followed  by opening of the peritoneal cavity in the midline.  The midline adhesions  were significantly more dense in the midline that it was appreciable  laparoscopically, but these were able to be dissected free and the  peritoneal cavity entered.  There was no suspicion of bowel injury.  At this  time, attention could be directed to the right lower quadrant.  A Balfour  retractor was put in place, laparotomy tapes x2, and a moistened towel was  placed to hold the bowel and right adnexa.  A Babcock clamp could be placed on the tube and ovary with retroperitoneal dissection to isolate the IP  ligament.  The ureter was identified  at the pelvic brim and followed down  and was elevated out of harm's way.  We then proceeded with palpable  reconfirmation of the ureter, cross clamping of the isolated IP ligament,  and double ligature of a 0 chromic.  The portion of the IP ligament to be  excised and tube and ovary were placed on counter traction, and we  proceeded  down the side wall with sharp dissection as possible, followed by cross  clamping the scar tissue from the ovary to the vaginal cuff with a Kelly  clamp which allowed amputation of the tube and ovary, and then a fore-and-  aft suture around the pedicle was performed.  Hemostasis was confirmed as  good with irrigation of the pelvis.   Laparotomy equipment was removed and the anterior peritoneum closed with 2-0  chromic, the fascia closed with 0 Vicryl, the omentum having been positioned  all the way to the bladder dome, attempting to reduce potential for bowel  adhesions to the anterior abdominal wall.  We then proceeded with the  remainder of the closure.  As stated, 0 chromic peritoneal closure was  followed by 0 Vicryl fascial closure and interrupted 0 chromic sutures to  reapproximate the subcu fatty tissue.  Staple closure of the skin completed  the Pfannenstiel incision.  The 12 mm port in the right lower quadrant was  closed at the level of the fascia using Kocher clamps and 0 Vicryl to  isolate two achieve fascial closure.  Staple closure of the umbilical and  lower quadrant staple trocar sites was performed.  The patient tolerated the  procedure well.  He went to the recovery room.  The estimated blood loss was  150 cc.                                               Jonnie Kind, M.D.    JVF/MEDQ  D:  09/14/2002  T:  09/14/2002  Job:  [id number]

## 2011-03-02 NOTE — Discharge Summary (Signed)
[place]  Patient:    [name], [name]                    MRN: [id number] Adm. Date:  [id number] Disc. Date: [id number] Attending:  Axel Filler Dictator:   Jannette Fogo, M.D. CC:         [facility]   Discharge Summary  DISCHARGE DIAGNOSES: 1. Atypical chest pain, resolved. 2. Coronary artery disease.  Status post myocardial infarction with stenting    in 1996.  Repeat catheterization in 1997 showed no evidence of stenosis.    Another catheterization in 2000 showed approximately 20% narrowing in the    right coronary artery. 3. Gastroesophageal reflux disease. 4. Status post pneumonia, October 2001. 5. Tobacco use.  DISCHARGE MEDICATIONS: 1. Toprol XL 25 mg one p.o. q.d. 2. Protonix 40 mg one p.o. q.d. 3. Zithromax 250 mg one p.o. t.i.d. 4. Albuterol MDI p.r.n. 5. Aspirin.  FOLLOW-UP:  [name] will follow up with Dr. Sabra Heck in the [facility] Friday January 18 at 1:30 p.m.  CONSULTATIONS:  Cardiology, Dr. Velora Heckler, October 30, 2000.  PROCEDURES:  None.  HISTORY OF PRESENT ILLNESS:   [name] is a 60 year old white female with a history of coronary artery disease, prior MI, and tobacco use, who presented to the emergency department complaining of chest pain.  Chest pain began approximately three days prior to the date of admission, were worsened with movement and relieved by rest.  The pain began under her right breast, and radiated to the left side of her chest.   She described it as "sharp pain", 8/10 in intensity, but relieved somewhat by nitroglycerin.  The pains last approximately 1-2 minutes and had been occurring approximately 5 times per day.  They are accompanied by nausea, but no emesis, shortness of breath, or pleuritic pain.  She has had no similar symptoms in the past and states, that this pain is not like that associated with her prior MI.  She does complain of cough  productive of white sputum, but denies fever, chills or hemoptysis.  Of note, she was seen in the emergency department for a similar complaint two days prior to admission, but left against medical advise.  Cardiology work-up was in process at the time of her leaving AMA, by Dr. Ermalinda Memos group.  She has since that time been discharged from Dr. Ermalinda Memos practice.  PHYSICAL EXAMINATION:  VITAL SIGNS:  Temperature 100.1, blood pressure 120/70, pulse 60, respirations 16, oxygen saturation is 98% on room air.  GENERAL:  Moderately obese white female, talkative, no acute distress.  HEENT:  PERRL, EOMI.  NECK:  No LAD, no JVD.  CARDIOVASCULAR:  Regular rate and rhythm, no murmurs, rubs or gallops.  RESPIRATORY:  Good air movement, few wheezes bilaterally.  ABDOMEN:  Soft, nontender, nondistended with positive bowel sounds.  EXTREMITIES:  No clubbing, cyanosis, or edema.  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  Pulses are 2+ at the dorsalis pedis pulse.  NEURO:  Cranial nerves two through 12 intact.  No focal deficits.  Moves all extremities.  RECTAL:  Refused by patient.  ADMISSION LABORATORY DATA:  White blood cells 10.6, hemoglobin 13.2, platelets 346, MCV 102, PT 12.9, INR 1.0, PTT 31.  FIRST SET OF CARDIAC ENZYMES:  CK 67, MB 0.9, troponin I less than 0.1.  Chest x-ray showed no active disease.  EKG showed normal sinus rhythm  with a rate of 64.  HOSPITAL COURSE:  [name] was admitted to telemetry and ruled out for myocardial infarction via cardiac enzymes and EKG.  Dr. Velora Heckler saw Mrs. Strange on the day of discharge and felt that she was suitable for discharge and arrangements were made for her to have a Cardiolite as an outpatient.  Her chest pain resolved before she was actually admitted to the hospital and remained in check during her stay.  Of note, she did run a slight temperature on the second day of  admission, and was empirically started on Zithromax for possible bronchitis.  DISCHARGE LABORATORY DATA:  White blood cells 10.6, hemoglobin 13, MCV 101.9, platelets 346, PT 12.9, INR 1.0, PTT 31.  Sodium 135, potassium 3.6, chloride 105, bicarb 26, glucose 101, BUN 11, creatinine 0.7, calcium 7.8, total protein 6.2, albumin 3.2, AST 16, ALT 16, alkaline phosphatase 54.  Total bilirubin 0.1.  SECOND SET OF CARDIAC ENZYMES:  CK 53, MB less than 0.3, index ______, troponin I less than 0.01.  LIPID PROFILE:  Total cholesterol 213, triglycerides 238, HDL 32, LDL 133. Blood cultures showed no growth. DD:  11/26/00 TD:  11/26/00 Job: [id number]

## 2011-03-02 NOTE — H&P (Signed)
[name]             ACCOUNT NO.:  [id number]   MEDICAL RECORD NO.:  [id number]          PATIENT TYPE:  AMB   LOCATION:  DAY                           FACILITY:  [facility]   PHYSICIAN:  Miguel Dibble, M.D.   DATE OF BIRTH:  12-05-1950   DATE OF ADMISSION:  02/26/2005  DATE OF DISCHARGE:  LH                                HISTORY & PHYSICAL   CHIEF COMPLAINT:  Intermittent bilateral flank pain.   HISTORY OF PRESENT ILLNESS:  This 60 year old female has a history of  recurrent urolithiasis.  She had bilateral renal calculi in 2004.  She had  been having bilateral intermittent flank pain for the past two weeks.  The  pain is very severe, 8 to 9/10 on a pain scale.  The patient is not having  any fever, chills, or hematuria.  She has urinary frequency x 8 to 10 and  nocturia x 2 to 3.  She has undergone evaluation with a noncontrast CT scan  of the abdomen and pelvis at [facility].  This revealed moderate  right hydronephrosis and mild left hydronephrosis.  There was a  nonobstructing calculus in the lower pole of the left kidney measuring 4 mm  in size.  There were two right distal ureteral calculi, each measuring 5 mm  in size.  There was a 3 mm size left distal ureteral calculus at the level  of the ureterosacral junction.  The patient is not able to pass the stones.  She has severe persistent pain.  She is brought to the short-stay center  today for cystoscopy, bilateral retrograde pyelograms, uteroscopy stone  extraction, and ureteral stent placement.   PAST MEDICAL HISTORY:  1.  History of elevated cholesterol.  2.  History of coronary artery disease status post MI in 1996.  3.  Status post hysterectomy in 1978.  4.  Status post left oophorectomy and appendectomy in 1980.  5.  Status post right oophorectomy in December 2003.  6.  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   MEDICATIONS:  Lipitor, Toprol, Prevacid, and  aspirin 81 mg which was  discontinued for the surgery.   ALLERGIES:  PENICILLIN, CIPRO, BETADINE, PLASTIC TAPE, and NEOSPORIN.   PHYSICAL EXAMINATION:  HEAD, EYES, EARS, NOSE, AND THROAT:  Normal.  NECK:  No masses.  LUNGS:  Clear to auscultation.  HEART:  Regular rate and rhythm.  No murmur.  ABDOMEN:  Soft.  No palpable flank mass.  Moderate bilateral costovertebral  angle tenderness as noted.  No suprapubic tenderness.  Bladder not palpable.  EXTREMITIES: Status post traumatic amputation of the right hand.   IMPRESSION:  1.  Right bilateral distal ureteral calculi with obstruction.  2.  Bilateral renal colic.  3.  Bilateral hydronephrosis.  4.  Nonobstructing left renal calculus.   PLAN:  Cystoscopy, bilateral retrograde pyelograms, bilateral ureteroscopy  stone extraction, and bilateral ureteral stent placement under anesthesia in  short-stay center.  I have discussed with the patient regarding the  diagnoses, operative details, alternative treatments, outcome, possible  risks and  complications, and she has agreed for the procedure to be done.      SK/MEDQ  D:  02/25/2005  T:  02/25/2005  Job:  [id number]   cc:   Florian Buff, M.D.  [address] [place]  [place] [address]  Fax: [place] Luan Pulling, M.D.  [place]  [place] [address]  Fax: [facility]  Fax: [phone]

## 2011-03-02 NOTE — Op Note (Signed)
[name], [name]             ACCOUNT NO.:  [id number]   MEDICAL RECORD NO.:  [id number]          PATIENT TYPE:  AMB   LOCATION:  DAY                           FACILITY:  [facility]   PHYSICIAN:  R. Garfield Cornea, M.D. DATE OF BIRTH:  09/03/1951   DATE OF PROCEDURE:  01/28/2006  DATE OF DISCHARGE:                                 OPERATIVE REPORT   PROCEDURE PERFORMED:  Colonoscopy with segmental biopsy; snare polypectomy.   ENDOSCOPIST:  Bridgette Habermann, MD.   INDICATIONS FOR PROCEDURE:  The patient is a 60 year old lady with a chronic  post prandial diarrhea.  She says she has not had a formed stool in the past  20 years.  She was seen by me last year for the workup of diarrhea but  deferred the workup until now.  There is no family history of any first-  degree relatives with colon cancer, and a colonoscopy is now being done, the  potential risks, benefits, alternatives have been reviewed, questions  answered, and she is agreeable.  Please see documentation in medical  records.   PROCEDURE NOTE:  Oxygen saturation, blood pressure, pulse, respirations  monitored throughout the entire procedure.  Conscious sedation with Versed 4  mg IV, Demerol 100 mg IV, and Phenergan 25 mg IV slow IV push.   INSTRUMENT:  Olympus pediatric scope.   FINDINGS:  A digital rectal exam revealed no abnormalities.   ENDOSCOPIC FINDINGS:  The prep was suboptimal.  There was some vegetable  material and some semi-formed stool throughout her colon.   Rectum:  Examination of the rectal mucosa including retroflex view of the  anal verge revealed no abnormalities.   Colon:  Colonic mucosa was surveyed from the rectosigmoid junction through  the left, transverse and right colon to the area of the appendiceal orifice  and ileocecal valve and cecum.  These structures were well seen and  photographed for the record.  From this level, the scope was slowly  withdrawn.  All previously mentioned mucosal  surfaces were again seen.  There was a 3-mm polyp in the cecum which was cold biopsied.  Segmental  biopsies of the transverse and sigmoid colon were taken to rule out  microscopic colitis.  Stool samples suctioned out for microbiology studies.  There was an 8 mm pedunculated polyp in the mid descending polyp which was  removed with a snare cautery and recovered through the scope.  The prep was  not very good and quite a bit of washing and suctioning had to be done to  see the colonic mucosa.  Some other smaller lesion may have been obscured by  the poor prep.  The patient tolerated the procedure well and was reacted in  Endoscopy.   IMPRESSION:  1.  Normal rectum.  2.  Pedunculated polyp in the mid descending colon removed with snare.  3.  Diminutive polyp at the cecum cold biopsy/removed.  4.  The remainder of colonic mucosa grossly normal.  A poor prep compromised      the examination.  Segmental biopsies of the transverse and sigmoid colon  taken to rule out microscopic colitis.  Stool sample collected.   RECOMMENDATIONS:  1.  No aspirin or arthritis medications for 10 days.  2.  Follow up on Path.  3.  Further recommendations to follow.      Bridgette Habermann, M.D.  Electronically Signed     RMR/MEDQ  D:  01/28/2006  T:  01/28/2006  Job:  [id number]   cc:   Shann Medal. Kizzie Furnish, MD  Fax: [phone]

## 2011-03-02 NOTE — Cardiovascular Report (Signed)
   NAME:  [name], [name]                       ACCOUNT NO.:  [id number]   MEDICAL RECORD NO.:  [id number]                   PATIENT TYPE:  INP   LOCATION:  2001                                 FACILITY:  [facility]   PHYSICIAN:  Jenkins Rouge, M.D. [facility]              DATE OF BIRTH:  September 08, 1951   DATE OF PROCEDURE:  DATE OF DISCHARGE:                              CARDIAC CATHETERIZATION   PROCEDURE:  Coronary arteriography.   INDICATION:  The patient is a 60 year old patient sent down from [place]  by Dr. Coralie Keens for heart catheterization.  She has been having recurrent  chest pain.   DESCRIPTION OF PROCEDURE:  Standard catheterization was done from the right  femoral artery.  The patient was quite anxious and tearied-eyed before the  case. I believe her previous husband died of a heart attach.   She was well sedated with 3.5 mg of Versed and tolerated the procedure well.   RESULTS:  Left main coronary artery was normal.   Left anterior descending artery was normal.   The first diagonal branch is normal.   The left circumflex coronary artery was codominant.  There was one large  obtuse marginal branch.  The entire artery was normal.   The right coronary artery was codominant and it was normal.   RIGHT ANTERIOR OBLIQUE VENTRICULOGRAPHY:  RAO ventriculography was normal,  ejection fraction 65%.  There were no regional wall motion abnormalities.  No gradient across the aortic valve.   Aortic pressure was 118/71, left ventricular pressure was 118/15.   IMPRESSION/PLAN:  The patient has no significant coronary artery disease.  Her chest pain would appear to be noncardiac in etiology.  As long as her  leg heals well she will be discharged in the morning.                                                          Jenkins Rouge, M.D. [facility]    PN/MEDQ  D:  10/29/2002  T:  10/29/2002  Job:  [id number]   cc:   [facility]  [place], [place]   Signa Kell, M.D. [facility]   [place] Loser. Luan Pulling, M.D.  [place]  [place] [address]  Fax: [phone]

## 2011-03-02 NOTE — Discharge Summary (Signed)
   NAME:  [name], [name]                       ACCOUNT NO.:  [id number]   MEDICAL RECORD NO.:  [id number]                   PATIENT TYPE:  INP   LOCATION:  2001                                 FACILITY:  [facility]   PHYSICIAN:  Marijo Conception. Wall, M.D. [facility]            DATE OF BIRTH:  Feb 20, 1951   DATE OF ADMISSION:  10/28/2002  DATE OF DISCHARGE:  10/29/2002                           DISCHARGE SUMMARY - REFERRING   PROCEDURES:  1. Cardiac catheterization.  2. Coronary arteriogram.  3. Left ventriculogram.   HOSPITAL COURSE:  The patient is a 60 year old female with a history of  nonobstructing coronary artery disease by catheterization in 2000.  She has  a history of myocardial infarction in 1997 that was treated with PTCA and  circumflex.  She also has preserved left ventricular function.   On the day of admission, she had acute onset of substernal chest discomfort  associated with nausea and diaphoresis lasting five hours.  She took two  nitroglycerin without relief.  She awoke again at 2 a.m. with the same  substernal chest discomfort.  She was seen in the office and admitted to the  hospital for further evaluation and treatment.   It was felt that the patient should be recatheterized for further  evaluation.  Her enzymes were negative for MI and she had a cardiac  catheterization on October 29, 2002.  The left main and LAD had no  significant disease.  The circumflex and RCA were dominant and also had no  critical disease.  Her EF was greater than 60%.  The films were evaluated by  Dr. Jenkins Rouge, who felt that she had no significant coronary artery  disease.   It was felt that the patient needed to stay overnight because of the need to  follow her groin and make sure it was stable and because she had been  hypokalemic and this had been noted but we wanted to make sure that she was  going to do well.  However, the patient insisted on being discharged and  left AMA on the evening  of October 29, 2002.       Davis Gourd, P.A. [place]. Verl Blalock, M.D. [facility]    RG/MEDQ  D:  12/28/2002  T:  12/28/2002  Job:  [id number]

## 2011-03-22 ENCOUNTER — Encounter (HOSPITAL_COMMUNITY): Payer: Medicare Other | Admitting: Psychiatry

## 2011-03-30 ENCOUNTER — Ambulatory Visit (HOSPITAL_COMMUNITY)
Admission: RE | Admit: 2011-03-30 | Discharge: 2011-03-30 | Disposition: A | Discharge status: Home / Self Care | Payer: Medicare Other | Source: Ambulatory Visit | Attending: Cardiology | Admitting: Cardiology

## 2011-03-30 ENCOUNTER — Ambulatory Visit: Payer: Medicare Other | Admitting: Internal Medicine

## 2011-03-30 DIAGNOSIS — R0989 Other specified symptoms and signs involving the circulatory and respiratory systems: Secondary | ICD-10-CM | POA: Insufficient documentation

## 2011-03-30 DIAGNOSIS — E785 Hyperlipidemia, unspecified: Secondary | ICD-10-CM | POA: Insufficient documentation

## 2011-03-30 DIAGNOSIS — I252 Old myocardial infarction: Secondary | ICD-10-CM | POA: Insufficient documentation

## 2011-03-30 DIAGNOSIS — I10 Essential (primary) hypertension: Secondary | ICD-10-CM | POA: Insufficient documentation

## 2011-03-30 DIAGNOSIS — R0602 Shortness of breath: Secondary | ICD-10-CM | POA: Insufficient documentation

## 2011-03-30 DIAGNOSIS — I2510 Atherosclerotic heart disease of native coronary artery without angina pectoris: Secondary | ICD-10-CM | POA: Insufficient documentation

## 2011-03-30 DIAGNOSIS — E119 Type 2 diabetes mellitus without complications: Secondary | ICD-10-CM | POA: Insufficient documentation

## 2011-03-30 DIAGNOSIS — R0609 Other forms of dyspnea: Secondary | ICD-10-CM | POA: Insufficient documentation

## 2011-03-30 LAB — GLUCOSE, CAPILLARY
Glucose-Capillary: 186 mg/dL — ABNORMAL HIGH (ref 70–99)
Glucose-Capillary: 207 mg/dL — ABNORMAL HIGH (ref 70–99)

## 2011-04-10 ENCOUNTER — Encounter (INDEPENDENT_AMBULATORY_CARE_PROVIDER_SITE_OTHER): Payer: Medicare Other | Admitting: Psychiatry

## 2011-04-10 DIAGNOSIS — F39 Unspecified mood [affective] disorder: Secondary | ICD-10-CM

## 2011-04-10 NOTE — Cardiovascular Report (Signed)
NAMEPRIM, MORACE NO.:  [id number]  MEDICAL RECORD NO.:  [id number]  LOCATION:  [facility]                         FACILITY:  [facility]  PHYSICIAN:  Laverda Page, MD DATE OF BIRTH:  July 12, 1951  DATE OF PROCEDURE:  03/30/2011 DATE OF DISCHARGE:  03/30/2011                           CARDIAC CATHETERIZATION   PROCEDURES PERFORMED: 1. Left ventriculography. 2. Selective right and left coronary arteriography. 3. Right femoral arteriography. 4. Closure of the right femoral artery access with Perclose.  INDICATIONS:  Ms. Keilly Fatula is a pleasant 60 year old female who has got hypertension, diabetes, hyperlipidemia, history of known coronary artery disease, history of myocardial infarction in 1996, and history of stents.  There are no details available in the hospital record.  She has been complaining of shortness of breath and dyspnea on exertion.  A stress test performed in the outpatient basis had revealed an inferior wall ischemia done on Mar 15, 2011.  Given this, she is now brought to the cardiac catheterization lab to evaluate coronary anatomy.  HEMODYNAMIC DATA:  The left ventricular pressure was 131/19 with end- diastolic pressure of 24 mmHg.  Aortic pressure was 130/74 with a mean of 95 mmHg.  There was no pressure gradient across the aortic valve.  ANGIOGRAPHIC DATA:  Left ventricle.  Left ventricular systolic function was normal with ejection fraction of 50-55%.  There was no regional wall motion abnormality.  No significant mitral regurgitation. Right coronary artery:  Right coronary artery is codominant with circumflex coronary artery.  The PDA of the circumflex is small.  In the midsegment after the origin of a small RV branch, there is a hazy, probably mildly calcified 20-30% stenoses.  The PDA has mild luminal irregularity. Left main coronary artery.  Left main coronary artery is a large-caliber vessel.  Mild calcification is  evident. Circumflex.  The circumflex is a large-caliber vessel.  It is codominant with the right coronary artery, giving origin to a very small PDA distally.  Gives origin to large obtuse marginal 1.  I cannot see a stent in the circumflex, however, cannot completely exclude but appears to be less likely that there is a stent in the circumflex system. LAD.  LAD is a moderate-caliber vessel which ends before reaching the apex.  It has got mild luminal irregularity.  Again, I cannot really see any stent in the LAD system.  IMPRESSION: 1. Mild luminal irregularity of the circumflex LAD and also the right     coronary artery.  Improvement in the flow through the left system     with intracoronary nitroglycerin administration. 2. Inferior wall ischemia could be an artifact from obesity and/or the     LAD not reaching the apex with inferior and inferoseptal defect     could be related to this along with apical thinning. 3. I do not see a clear-cut stent in the coronary arteries.  The     patient has history of myocardial infarction and a stent     implantation in 1996, and no medical records are available for this     in the hospital system.  No dictation was available.  RECOMMENDATIONS:  The patient will be discharged home  today with outpatient followup.  She will need continued risk factor modification including especially smoking cessation.  A total of 90 mL of contrast was utilized for diagnostic angiography.  TECHNIQUE OF THE PROCEDURE:  Under sterile precautions using a 6-French right femoral arterial access, 6-French multipurpose B2 catheter was advanced into the ascending aorta, then into the left ventricle.  The left ventriculography was performed both in LAO and RAO projection. Catheter was pulled into the ascending aorta.  The right coronary artery was selectively engaged and angiography was performed.  Then, the left main coronary artery was engaged and angiography was  performed.  Then, intracoronary nitroglycerin was administered.  Angiography repeated. Then, the catheter was pulled out of the body over a J-wire.  Right femoral arteriography was performed through the arterial access sheath and the access was closed with Perclose with excellent hemostasis.  The patient tolerated the procedure well.  No immediate complication noted.     Laverda Page, MD     JRG/MEDQ  D:  03/30/2011  T:  03/30/2011  Job:  [id number]  cc:   Unk Lightning, MD  Electronically Signed by Adrian Prows MD on 04/10/2011 10:21:16 AM

## 2011-04-12 IMAGING — CR DG CHEST 1V PORT
1 series · 1 of 1 positions shown · non-contrast
Comparison: 10/26/2008

CLINICAL DATA: Chest pain

PORTABLE CHEST - 1 VIEW

[view not recorded]
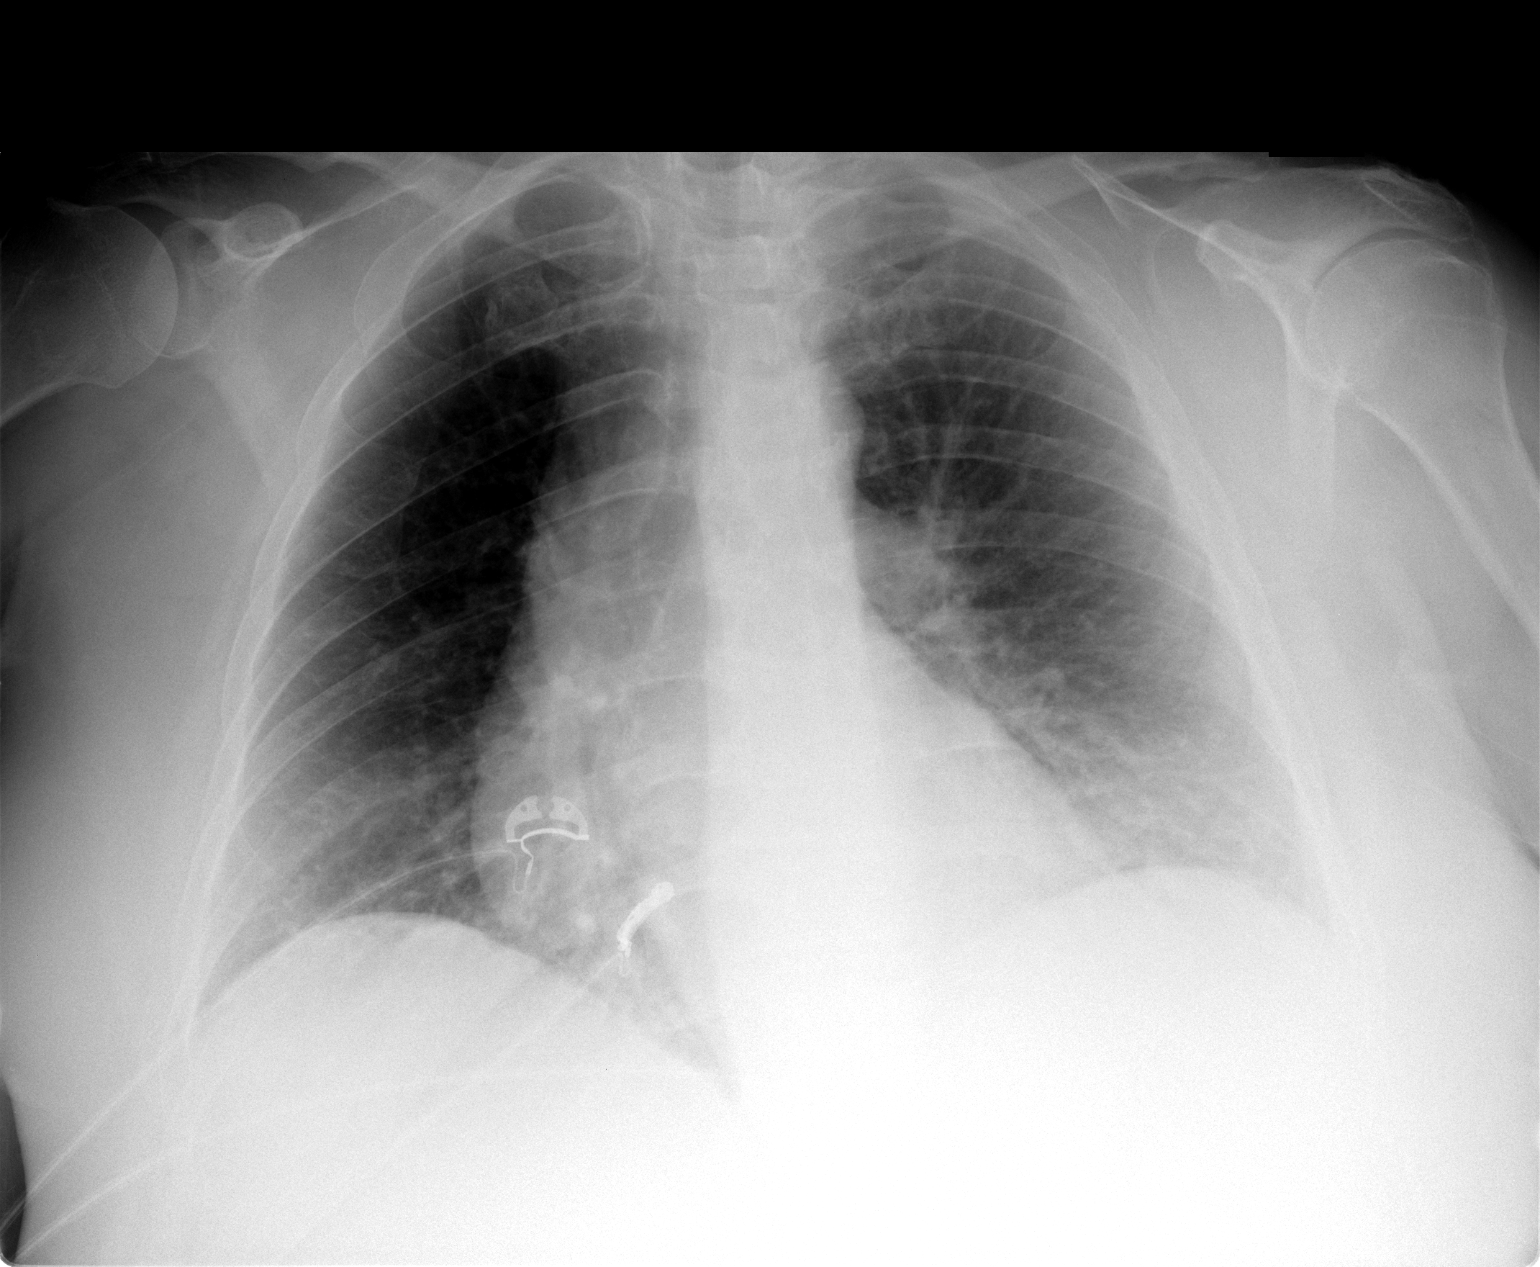

[1 of 1 positions shown; findings below may reference images not displayed]

FINDINGS: Cardiomegaly is again identified.
Mild peribronchial thickening is unchanged.
There is no evidence of focal airspace disease, pulmonary edema,
pleural effusion, or pneumothorax.
No acute bony abnormalities are identified.
IMPRESSION: Cardiomegaly without evidence of acute cardiopulmonary disease.

## 2011-04-17 ENCOUNTER — Encounter (INDEPENDENT_AMBULATORY_CARE_PROVIDER_SITE_OTHER): Payer: Medicare Other | Admitting: Psychiatry

## 2011-04-17 DIAGNOSIS — F39 Unspecified mood [affective] disorder: Secondary | ICD-10-CM

## 2011-05-01 ENCOUNTER — Encounter (INDEPENDENT_AMBULATORY_CARE_PROVIDER_SITE_OTHER): Payer: Medicare Other | Admitting: Psychiatry

## 2011-05-01 DIAGNOSIS — F39 Unspecified mood [affective] disorder: Secondary | ICD-10-CM

## 2011-05-02 ENCOUNTER — Other Ambulatory Visit (HOSPITAL_COMMUNITY): Payer: Medicare Other

## 2011-05-03 ENCOUNTER — Encounter (HOSPITAL_COMMUNITY)
Admission: RE | Admit: 2011-05-03 | Discharge: 2011-05-03 | Disposition: A | Discharge status: Home / Self Care | Payer: Medicare Other | Source: Ambulatory Visit | Attending: Orthopaedic Surgery | Admitting: Orthopaedic Surgery

## 2011-05-03 LAB — COMPREHENSIVE METABOLIC PANEL
AST: 26 U/L (ref 0–37)
Albumin: 3.8 g/dL (ref 3.5–5.2)
BUN: 8 mg/dL (ref 6–23)
Calcium: 10.3 mg/dL (ref 8.4–10.5)
Creatinine, Ser: 0.91 mg/dL (ref 0.50–1.10)

## 2011-05-03 LAB — DIFFERENTIAL
Basophils Absolute: 0 10*3/uL (ref 0.0–0.1)
Basophils Relative: 0 % (ref 0–1)
Eosinophils Absolute: 0.5 10*3/uL (ref 0.0–0.7)
Eosinophils Relative: 5 % (ref 0–5)
Monocytes Absolute: 0.6 10*3/uL (ref 0.1–1.0)
Neutro Abs: 4.3 10*3/uL (ref 1.7–7.7)

## 2011-05-03 LAB — CBC
Hemoglobin: 12.1 g/dL (ref 12.0–15.0)
MCHC: 32.4 g/dL (ref 30.0–36.0)
Platelets: 253 10*3/uL (ref 150–400)
RDW: 15.4 % (ref 11.5–15.5)

## 2011-05-03 LAB — PROTIME-INR
INR: 1.03 (ref 0.00–1.49)
Prothrombin Time: 13.7 seconds (ref 11.6–15.2)

## 2011-05-04 ENCOUNTER — Ambulatory Visit (HOSPITAL_COMMUNITY): Payer: Medicare Other

## 2011-05-04 ENCOUNTER — Inpatient Hospital Stay (HOSPITAL_COMMUNITY)
Admission: RE | Admit: 2011-05-04 | Discharge: 2011-05-05 | DRG: 491 | Disposition: A | Discharge status: Home / Self Care | Payer: Medicare Other | Source: Ambulatory Visit | Attending: Orthopaedic Surgery | Admitting: Orthopaedic Surgery

## 2011-05-04 DIAGNOSIS — E119 Type 2 diabetes mellitus without complications: Secondary | ICD-10-CM | POA: Diagnosis present

## 2011-05-04 DIAGNOSIS — M48061 Spinal stenosis, lumbar region without neurogenic claudication: Principal | ICD-10-CM | POA: Diagnosis present

## 2011-05-04 DIAGNOSIS — I252 Old myocardial infarction: Secondary | ICD-10-CM

## 2011-05-04 DIAGNOSIS — J449 Chronic obstructive pulmonary disease, unspecified: Secondary | ICD-10-CM | POA: Diagnosis present

## 2011-05-04 DIAGNOSIS — J4489 Other specified chronic obstructive pulmonary disease: Secondary | ICD-10-CM | POA: Diagnosis present

## 2011-05-04 DIAGNOSIS — Z01812 Encounter for preprocedural laboratory examination: Secondary | ICD-10-CM

## 2011-05-04 DIAGNOSIS — I10 Essential (primary) hypertension: Secondary | ICD-10-CM | POA: Diagnosis present

## 2011-05-04 DIAGNOSIS — Z888 Allergy status to other drugs, medicaments and biological substances status: Secondary | ICD-10-CM

## 2011-05-04 DIAGNOSIS — K219 Gastro-esophageal reflux disease without esophagitis: Secondary | ICD-10-CM | POA: Diagnosis present

## 2011-05-04 DIAGNOSIS — F17200 Nicotine dependence, unspecified, uncomplicated: Secondary | ICD-10-CM | POA: Diagnosis present

## 2011-05-04 DIAGNOSIS — Z880 Allergy status to penicillin: Secondary | ICD-10-CM

## 2011-05-04 DIAGNOSIS — E039 Hypothyroidism, unspecified: Secondary | ICD-10-CM | POA: Diagnosis present

## 2011-05-04 LAB — COMPREHENSIVE METABOLIC PANEL
ALT: 24 U/L (ref 0–35)
AST: 30 U/L (ref 0–37)
Alkaline Phosphatase: 104 U/L (ref 39–117)
Calcium: 10.2 mg/dL (ref 8.4–10.5)
GFR calc Af Amer: >60 mL/min (ref >60)
Glucose, Bld: 215 mg/dL — ABNORMAL HIGH (ref 70–99)
Potassium: 4.3 mEq/L (ref 3.5–5.1)
Sodium: 140 mEq/L (ref 135–145)
Total Protein: 7.1 g/dL (ref 6.0–8.3)

## 2011-05-04 LAB — GLUCOSE, CAPILLARY
Glucose-Capillary: 198 mg/dL — ABNORMAL HIGH (ref 70–99)
Glucose-Capillary: 222 mg/dL — ABNORMAL HIGH (ref 70–99)

## 2011-05-04 LAB — DIFFERENTIAL
Basophils Absolute: 0 10*3/uL (ref 0.0–0.1)
Eosinophils Absolute: 0.4 10*3/uL (ref 0.0–0.7)
Lymphocytes Relative: 36 % (ref 12–46)
Lymphs Abs: 2.7 10*3/uL (ref 0.7–4.0)
Neutrophils Relative %: 51 % (ref 43–77)

## 2011-05-04 LAB — CBC
HCT: 35.8 % — ABNORMAL LOW (ref 36.0–46.0)
MCV: 98.4 fL (ref 78.0–100.0)
Platelets: 213 10*3/uL (ref 150–400)
RBC: 3.64 MIL/uL — ABNORMAL LOW (ref 3.87–5.11)
RDW: 15.5 % (ref 11.5–15.5)
WBC: 7.5 10*3/uL (ref 4.0–10.5)

## 2011-05-04 LAB — PROTIME-INR: INR: 0.98 (ref 0.00–1.49)

## 2011-05-04 IMAGING — CR DG CHEST 2V
2 series · 2 of 2 positions shown · non-contrast
Comparison: 07/28/2010

CLINICAL DATA: Chest pain

CHEST - 2 VIEW

[view not recorded (1 of 2)]
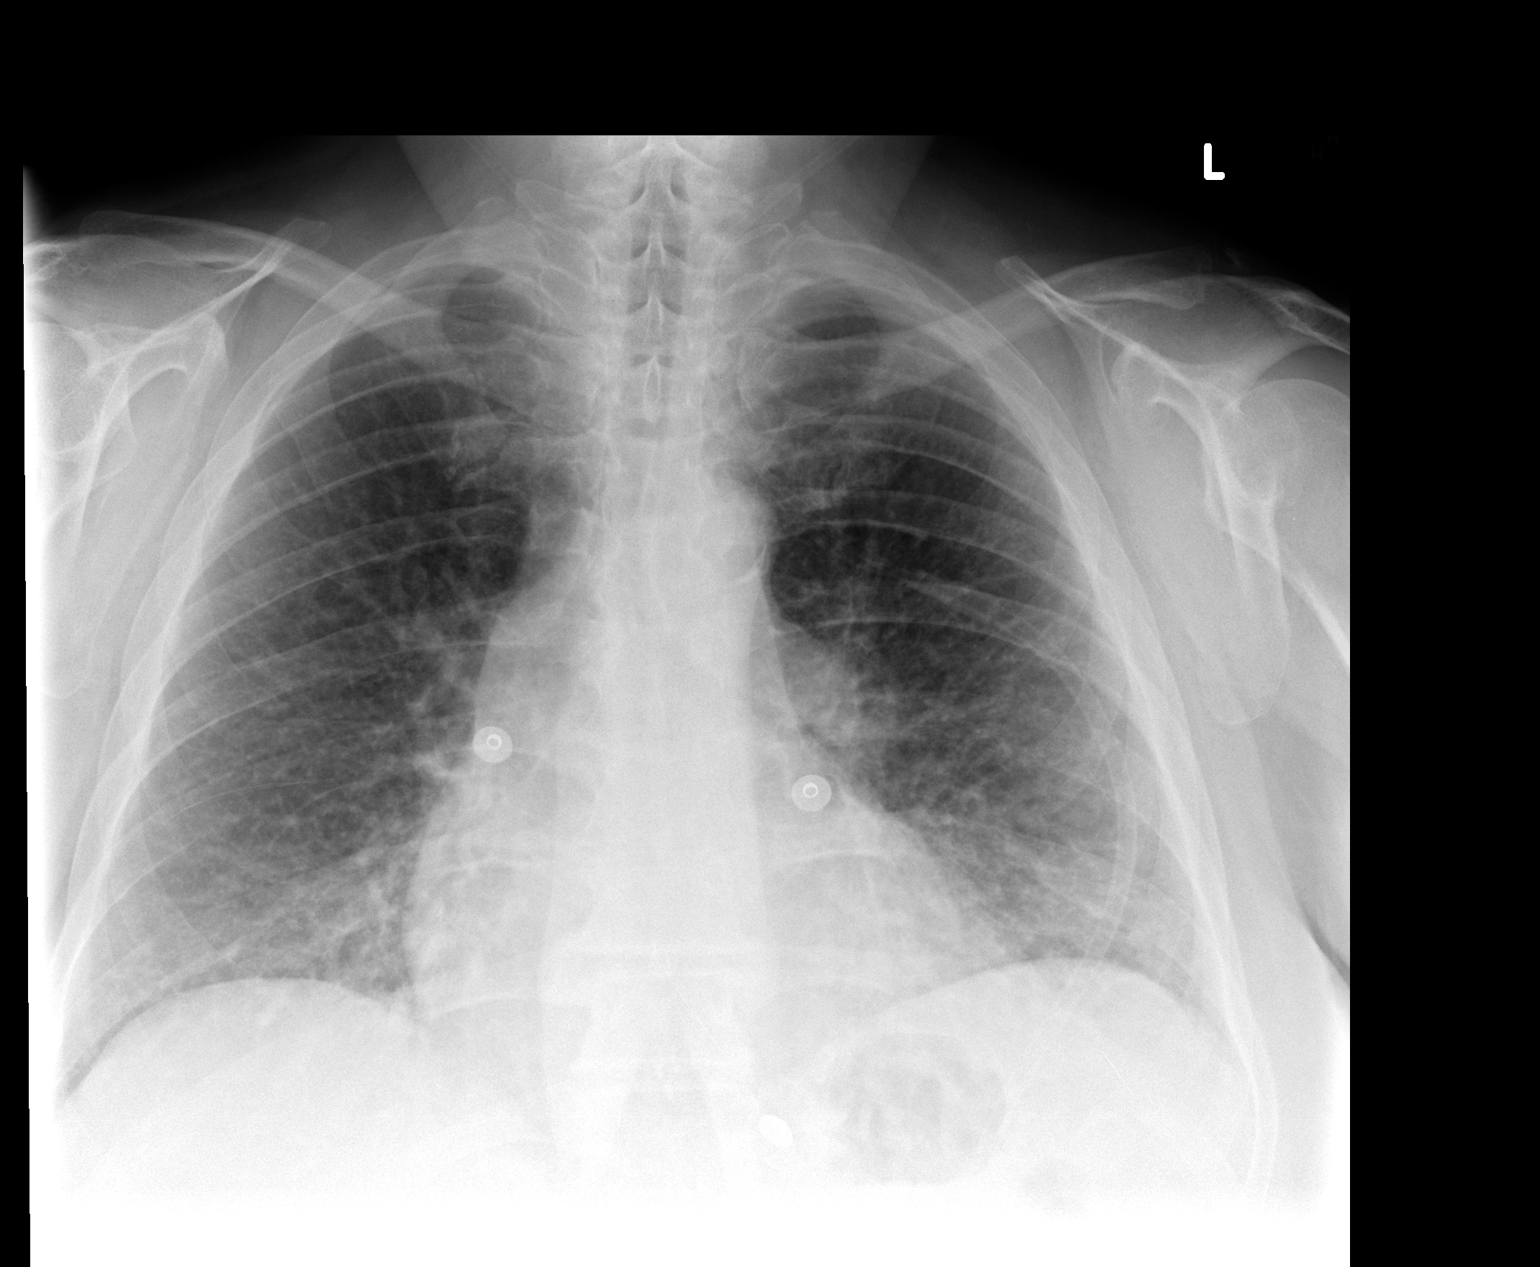

[view not recorded (2 of 2)]
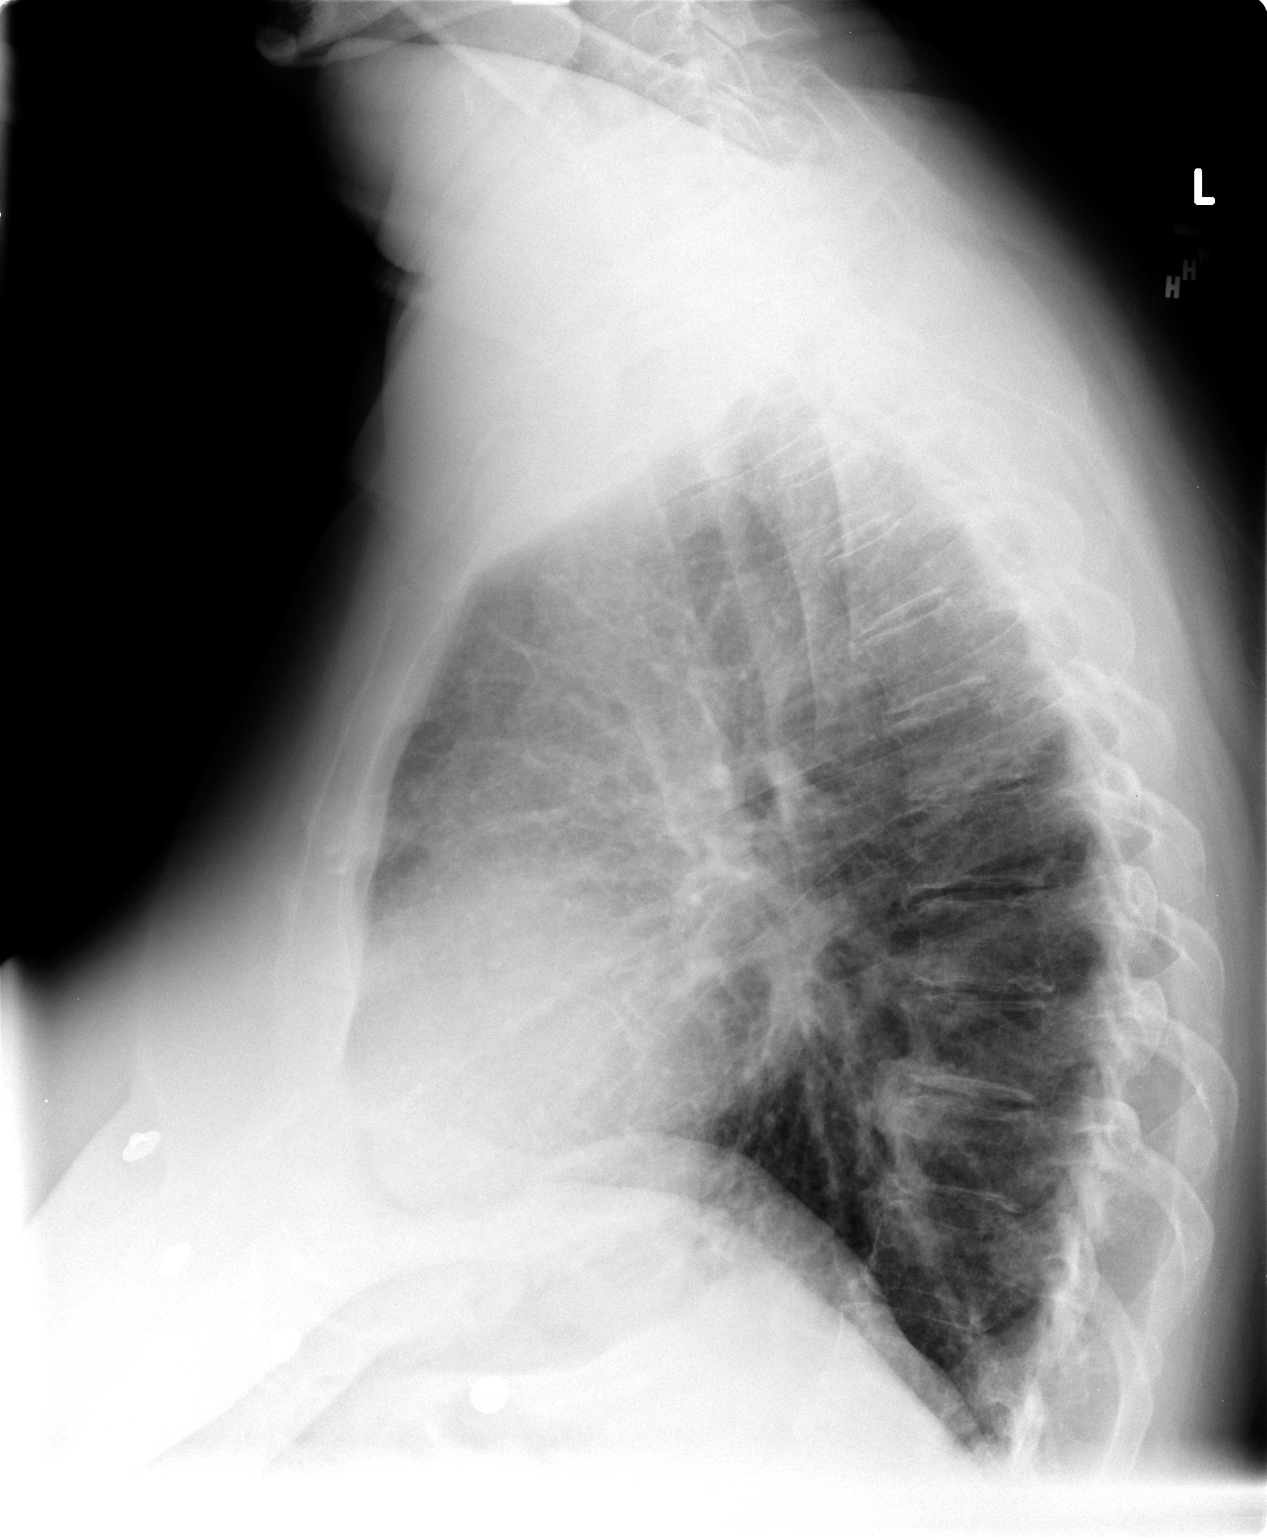

[2 of 2 positions shown; findings below may reference images not displayed]

FINDINGS: Heart size upper limits of normal. Lung volumes are low
with crowding of the bronchovascular markings.  Diffusely prominent
interstitial markings are noted without overt edema or focal
pulmonary opacity.  No pleural effusion.
IMPRESSION: Diffusely prominent interstitial markings which are nonspecific,
without focal opacity.

## 2011-05-05 LAB — GLUCOSE, CAPILLARY: Glucose-Capillary: 189 mg/dL — ABNORMAL HIGH (ref 70–99)

## 2011-05-07 NOTE — Op Note (Signed)
  NAMEJOCELINE, HINCHCLIFF NO.:  [id number]  MEDICAL RECORD NO.:  [id number]  LOCATION:  7858                         FACILITY:  [facility]  PHYSICIAN:  Renesha Lizama C. Lorin Mercy, M.D.    DATE OF BIRTH:  August 26, 1951  DATE OF PROCEDURE:  05/04/2011 DATE OF DISCHARGE:                              OPERATIVE REPORT   PREOPERATIVE DIAGNOSES:  Biforaminal stenosis, L4-5.  POSTOPERATIVE DIAGNOSES:  Biforaminal stenosis, L4-5.  PROCEDURE:  Central decompression and foraminotomy, L4-5.  SURGEON:  [name], MD  ASSISTANT:  Epimenio Foot, PA-C  ANESTHESIA:  GOT.  ESTIMATED BLOOD LOSS:  Less than 100 mL.  After induction of general anesthesia and orotracheal intubation, the patient was placed in prone, standard prepping and draping with ChloraPrep due to the patient's Betadine allergies and Steri-Drape was applied.  After the area was squared with towels and laminectomy sheets and drapes, time-out procedure was completed.  Vancomycin was given preoperatively.  Midline incision was made at the L4-5 level.  After needle localization, cross-table lateral x-ray confirmed that the needle was just below the 4-5 disk space even with the top end of the pedicle of L5.  Subperiosteal dissection on the lamina, Kocher was placed directly over the expected disk space and a second x-ray was taken, which confirmed this.  Lamina was were removed as well as the spinous process.  Thick chunks of ligament were removed and there was a right facet cyst not visualized on the MRI, which was from March and this was intermixed with some ligaments causing right foraminal stenosis.  As ligaments removed out to the level of the pedicle, disk space was visualized.  There were large epidural veins, which were coagulated. Hockey stick was used for palpation and with removal of overhanging facets on the anterior to the foramina, nerve root was decompressed. The right and left sides were cleaned of all chunks  of ligament.  Dura was intact.  Nerve root was visualized.  Disk was inspected and was firm, and no diskectomy was performed.  Operative microscope was used throughout the case since the second x-ray had been obtained.  There were some epidural veins on the right side, which were bleeding and some bipolar cautery was used as well as thrombin-soaked Gelfoam and operative field was dry at the time of closure.  Deep fascia was closed with 0 Vicryl, 2-0 Vicryl, subcutaneous tissue and subcuticular skin closure, and postop dressing. Instrument and needle count was correct.  Time-out procedure was completely closure.     Myrtha Tonkovich C. Lorin Mercy, M.D.     MCY/MEDQ  D:  05/04/2011  T:  05/04/2011  Job:  [id number]  Electronically Signed by Rodell Perna M.D. on 05/07/2011 04:11:11 PM

## 2011-05-17 IMAGING — CR DG LUMBAR SPINE COMPLETE 4+V
5 series · 5 of 5 positions shown · non-contrast
Comparison: 08/06/2005

CLINICAL DATA: Chronic low back pain left side into buttock and
down left leg, history bulging disc

LUMBAR SPINE - COMPLETE 4+ VIEW

[view not recorded (1 of 5)]
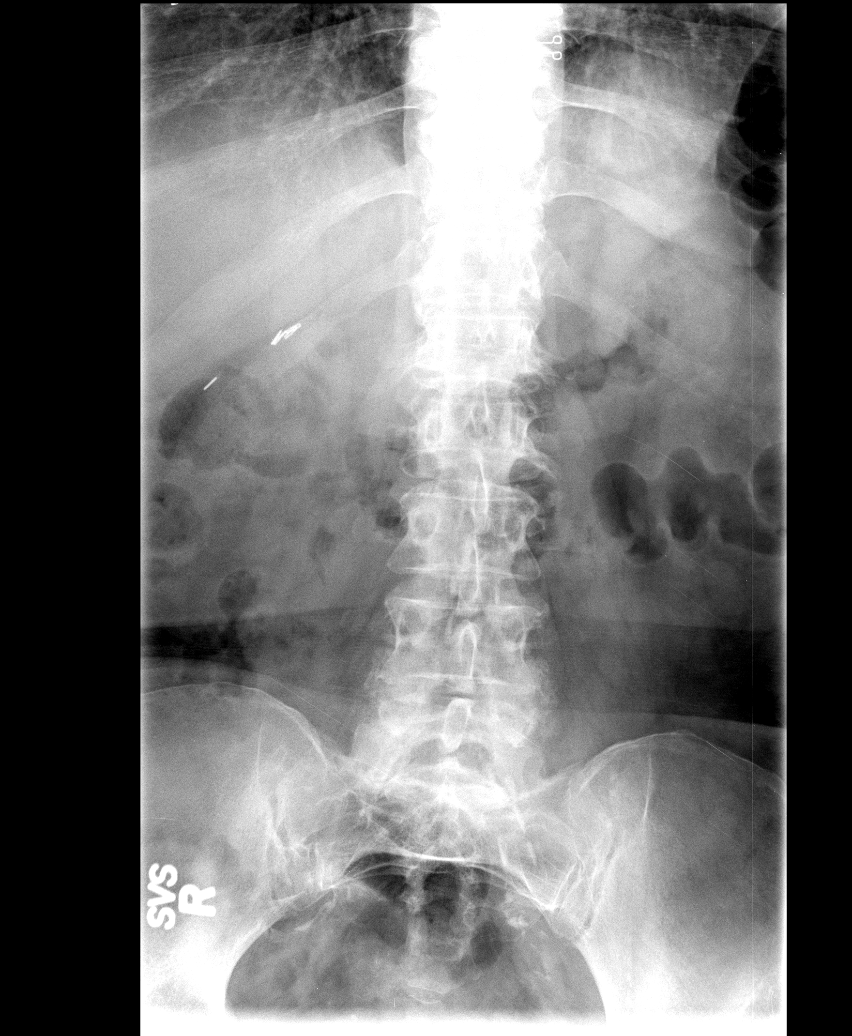

[view not recorded (2 of 5)]
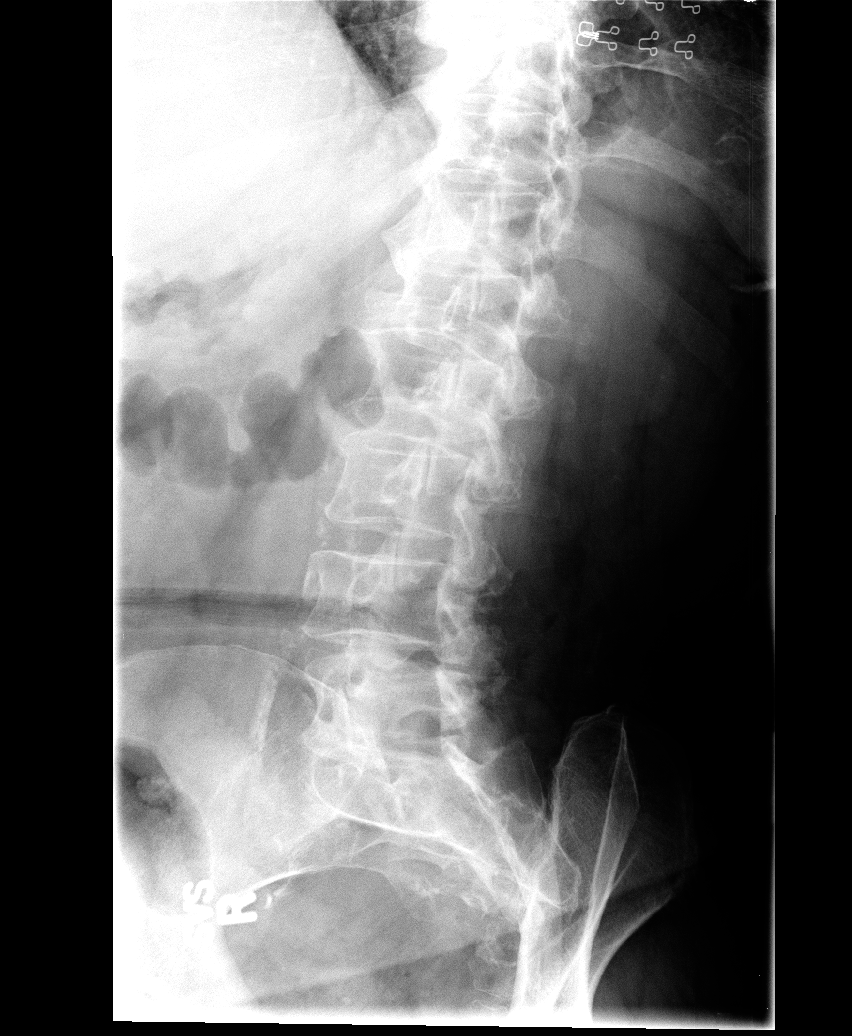

[view not recorded (3 of 5)]
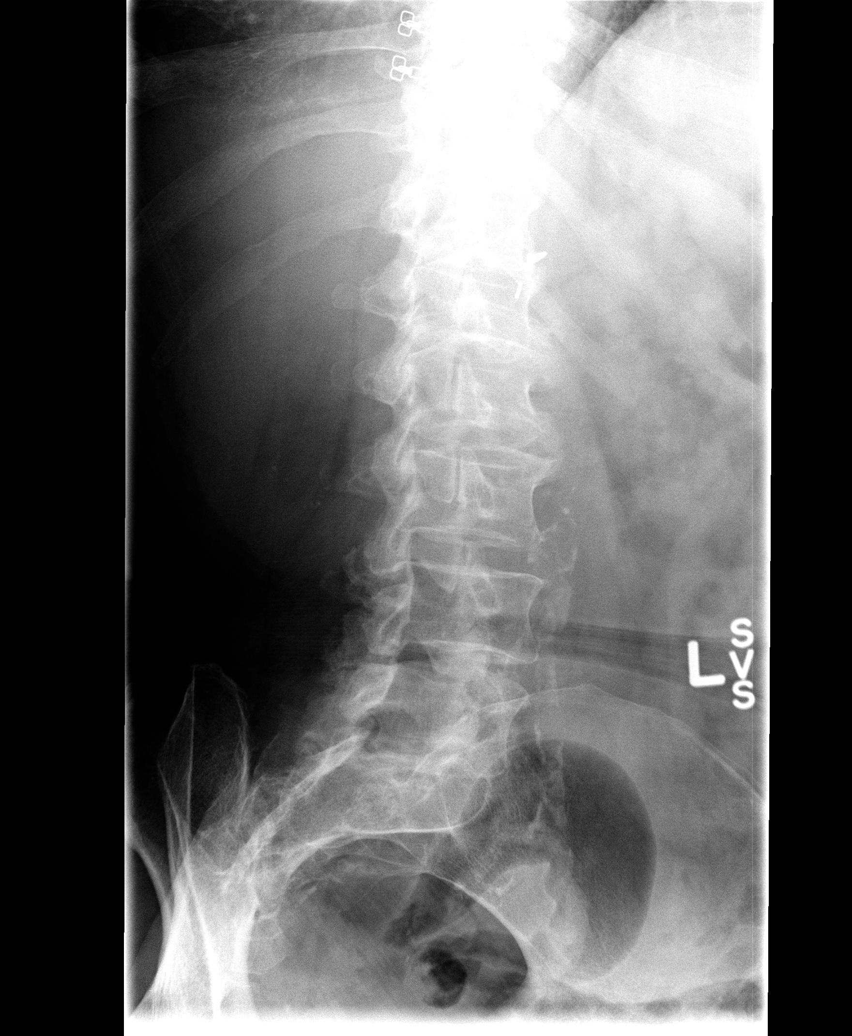

[view not recorded (4 of 5)]
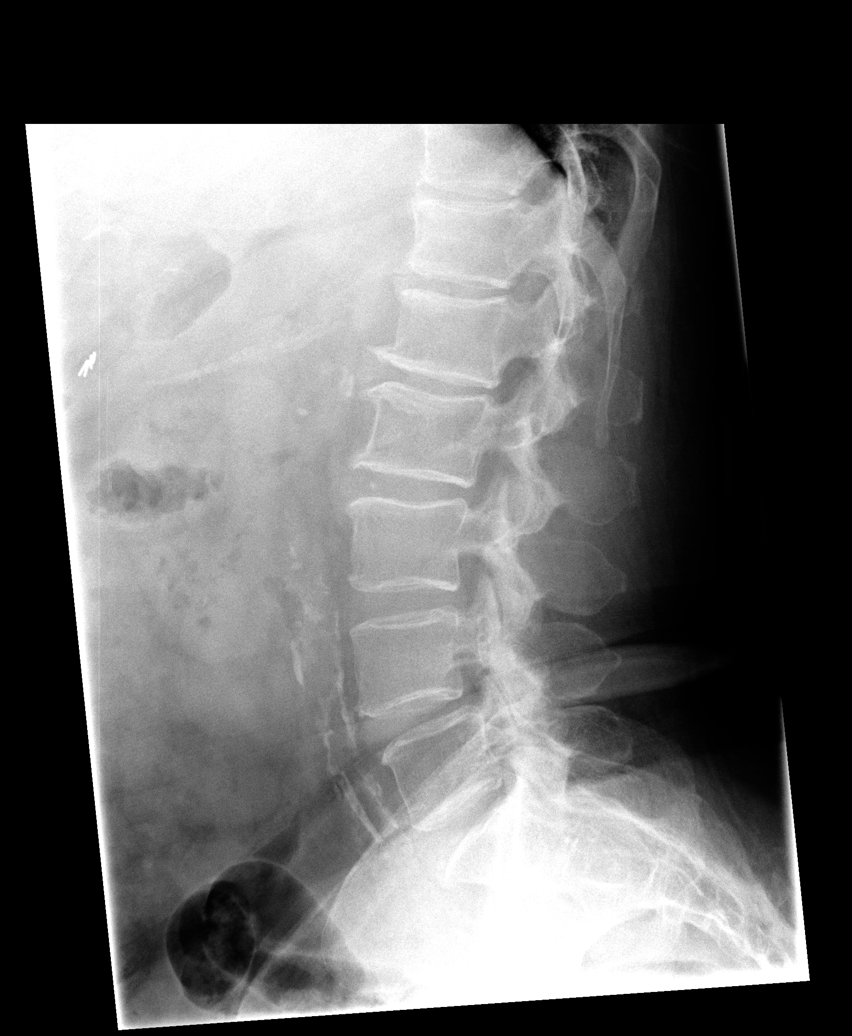

[view not recorded (5 of 5)]
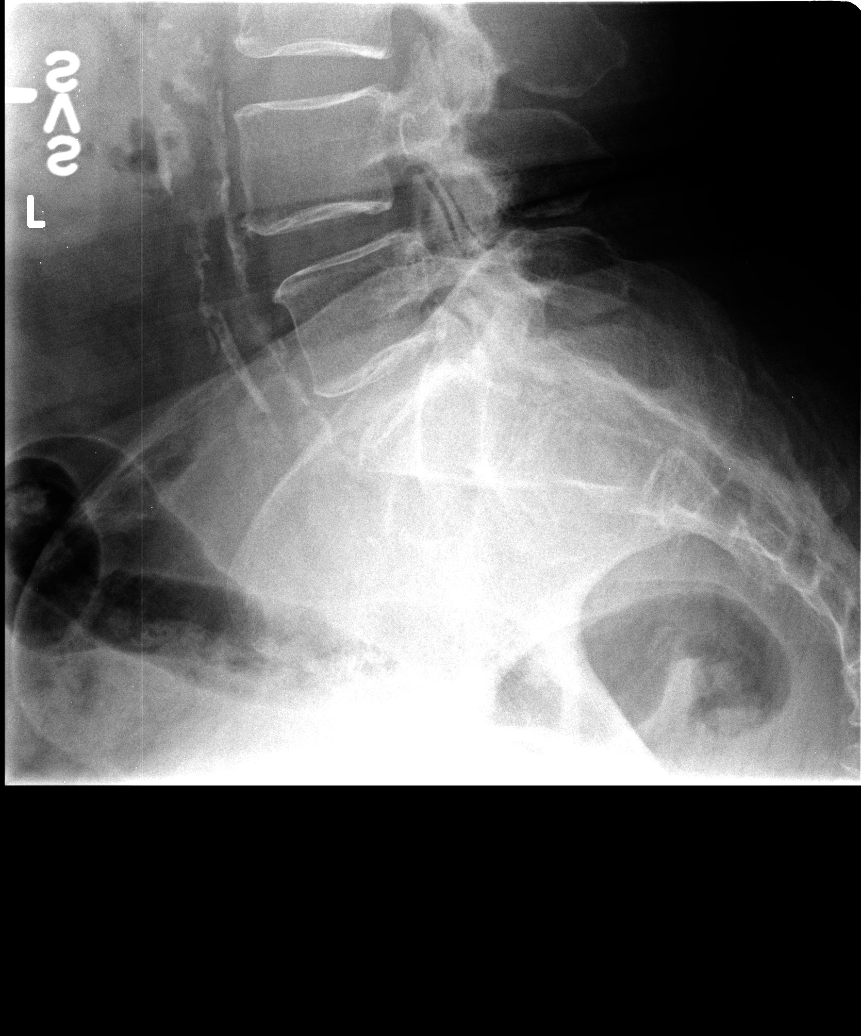

[5 of 5 positions shown; findings below may reference images not displayed]

FINDINGS: Five non-rib bearing lumbar vertebrae.
Osseous demineralization.
Minimal facet degenerative changes lower lumbar spine.
Disc space narrowing L4-L5, L1-L2, and at scattered thoracic discs.
Minimal anterolisthesis at L4-L5.
No acute fracture, additional subluxation, or bone destruction.
Scattered atherosclerotic calcifications aorta and iliac arteries.
No definite spondylolysis.
SI joints symmetric.
IMPRESSION: Mild degenerative disc and facet disease changes as above.
No acute abnormalities.

## 2011-05-31 ENCOUNTER — Encounter (HOSPITAL_COMMUNITY): Payer: Medicare Other | Admitting: Psychiatry

## 2011-06-04 NOTE — Discharge Summary (Signed)
  NAMECASSANDRE, OLEKSY NO.:  [id number]  MEDICAL RECORD NO.:  [id number]  LOCATION:  6295                         FACILITY:  [facility]  PHYSICIAN:  Mark C. Lorin Mercy, M.D.    DATE OF BIRTH:  [birth year]  DATE OF ADMISSION:  05/04/2011 DATE OF DISCHARGE:  05/05/2011                              DISCHARGE SUMMARY   ADMISSION DIAGNOSIS:  Foraminal stenosis L4-5.  DISCHARGE DIAGNOSIS:  Foraminal stenosis L4-5.  PROCEDURE:  On May 04, 2011, the patient underwent central decompression and foraminotomy at L4-5 performed by Dr. Lorin Mercy, assisted by Phillips Hay, [facility] under general anesthesia.  CONSULTATIONS:  None.  BRIEF HISTORY:  The patient is a 60 year old white female with several months of worsening leg pain right greater than left.  She has difficulty ambulating distances.  She continues to have symptoms of neurogenic claudication despite previous epidural steroid injections. 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  It was felt she would require surgical intervention and was admitted for the procedure as stated above.  BRIEF HOSPITAL COURSE:  The patient tolerated the procedure. Postoperatively, leg pain was improved and she was able to be out of bed ambulating to the bathroom.  She utilized p.o. analgesics for discomfort.  She was eating and urinating without difficulty.  She was able to be discharged to home on the first postoperative day.  PERTINENT LABORATORY VALUES:  CBC on admission with hemoglobin 11.9, hematocrit 35.8.  Chemistry studies within normal limits with exception of glucose 215.  Coagulation studies within normal limits.  The patient was positive for Staphylococcus aureus on preop nasal testing.  Negative for methicillin-resistant staph aureus.  PLAN:  The patient was discharged to her home.  The patient did not require any durable medical equipment.  She was instructed to continue on a diabetic diet.   She was instructed to change her dressing daily or as needed.  She will keep it dry and clean until her office visit in 1 week.  No bending, lifting or twisting.  She is allowed ambulation as tolerated.  MEDICATIONS AT DISCHARGE:  Percocet 5/325 one to two every 4-6 hours as needed for pain.  The patient was advised to call the office if she has questions or concerns prior to her return office visit.  CONDITION ON DISCHARGE:  Stable.     Epimenio Foot, P.A.   ______________________________ Thana Farr Lorin Mercy, M.D.    SMV/MEDQ  D:  05/28/2011  T:  05/28/2011  Job:  [id number]  Electronically Signed by Phillips Hay P.A. on 05/29/2011 12:33:15 PM Electronically Signed by Rodell Perna M.D. on 06/04/2011 04:33:54 PM

## 2011-06-05 ENCOUNTER — Encounter (HOSPITAL_COMMUNITY): Payer: Medicare Other | Admitting: Psychiatry

## 2011-07-12 ENCOUNTER — Encounter (INDEPENDENT_AMBULATORY_CARE_PROVIDER_SITE_OTHER): Payer: Medicare Other | Admitting: Psychiatry

## 2011-07-12 DIAGNOSIS — F331 Major depressive disorder, recurrent, moderate: Secondary | ICD-10-CM

## 2011-07-16 LAB — GLUCOSE, CAPILLARY: Glucose-Capillary: 65 — ABNORMAL LOW

## 2011-07-26 LAB — DIFFERENTIAL
Basophils Absolute: 0
Lymphocytes Relative: 33
Lymphs Abs: 3.1
Monocytes Absolute: 0.7
Monocytes Relative: 8
Neutro Abs: 5.3

## 2011-07-26 LAB — COMPREHENSIVE METABOLIC PANEL
Albumin: 4
BUN: 9
Calcium: 9.5
Creatinine, Ser: 0.77
Total Bilirubin: 0.4
Total Protein: 6.9

## 2011-07-26 LAB — CBC
HCT: 42.2
MCHC: 32.7
MCV: 95.6
Platelets: 351
RDW: 14.2 — ABNORMAL HIGH

## 2011-08-12 IMAGING — CR DG HIP COMPLETE 2+V*R*
3 series · 3 of 3 positions shown · non-contrast
Comparison: Lumbar spine radiographs 09/01/2010. No similar prior
study is available for comparison.

CLINICAL DATA: Fall, right hip pain

RIGHT HIP - COMPLETE 2+ VIEW

[view not recorded (1 of 3)]
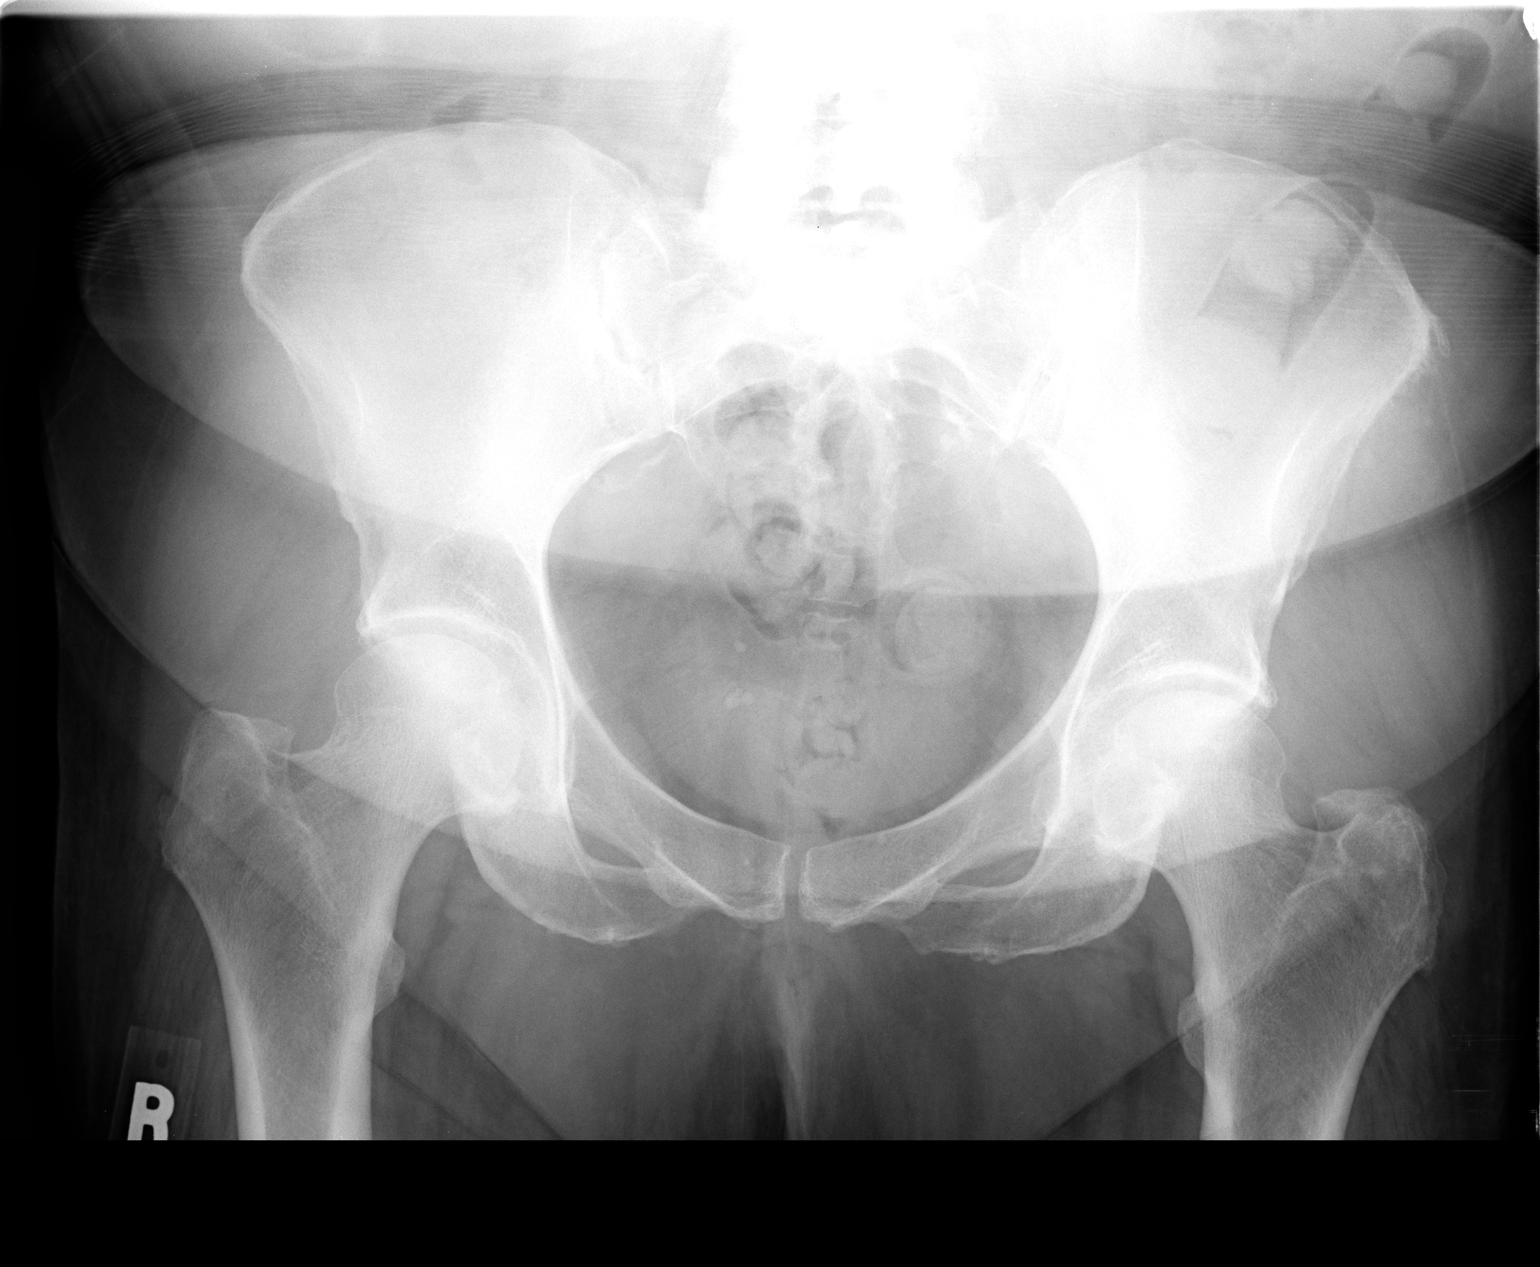

[view not recorded (2 of 3)]
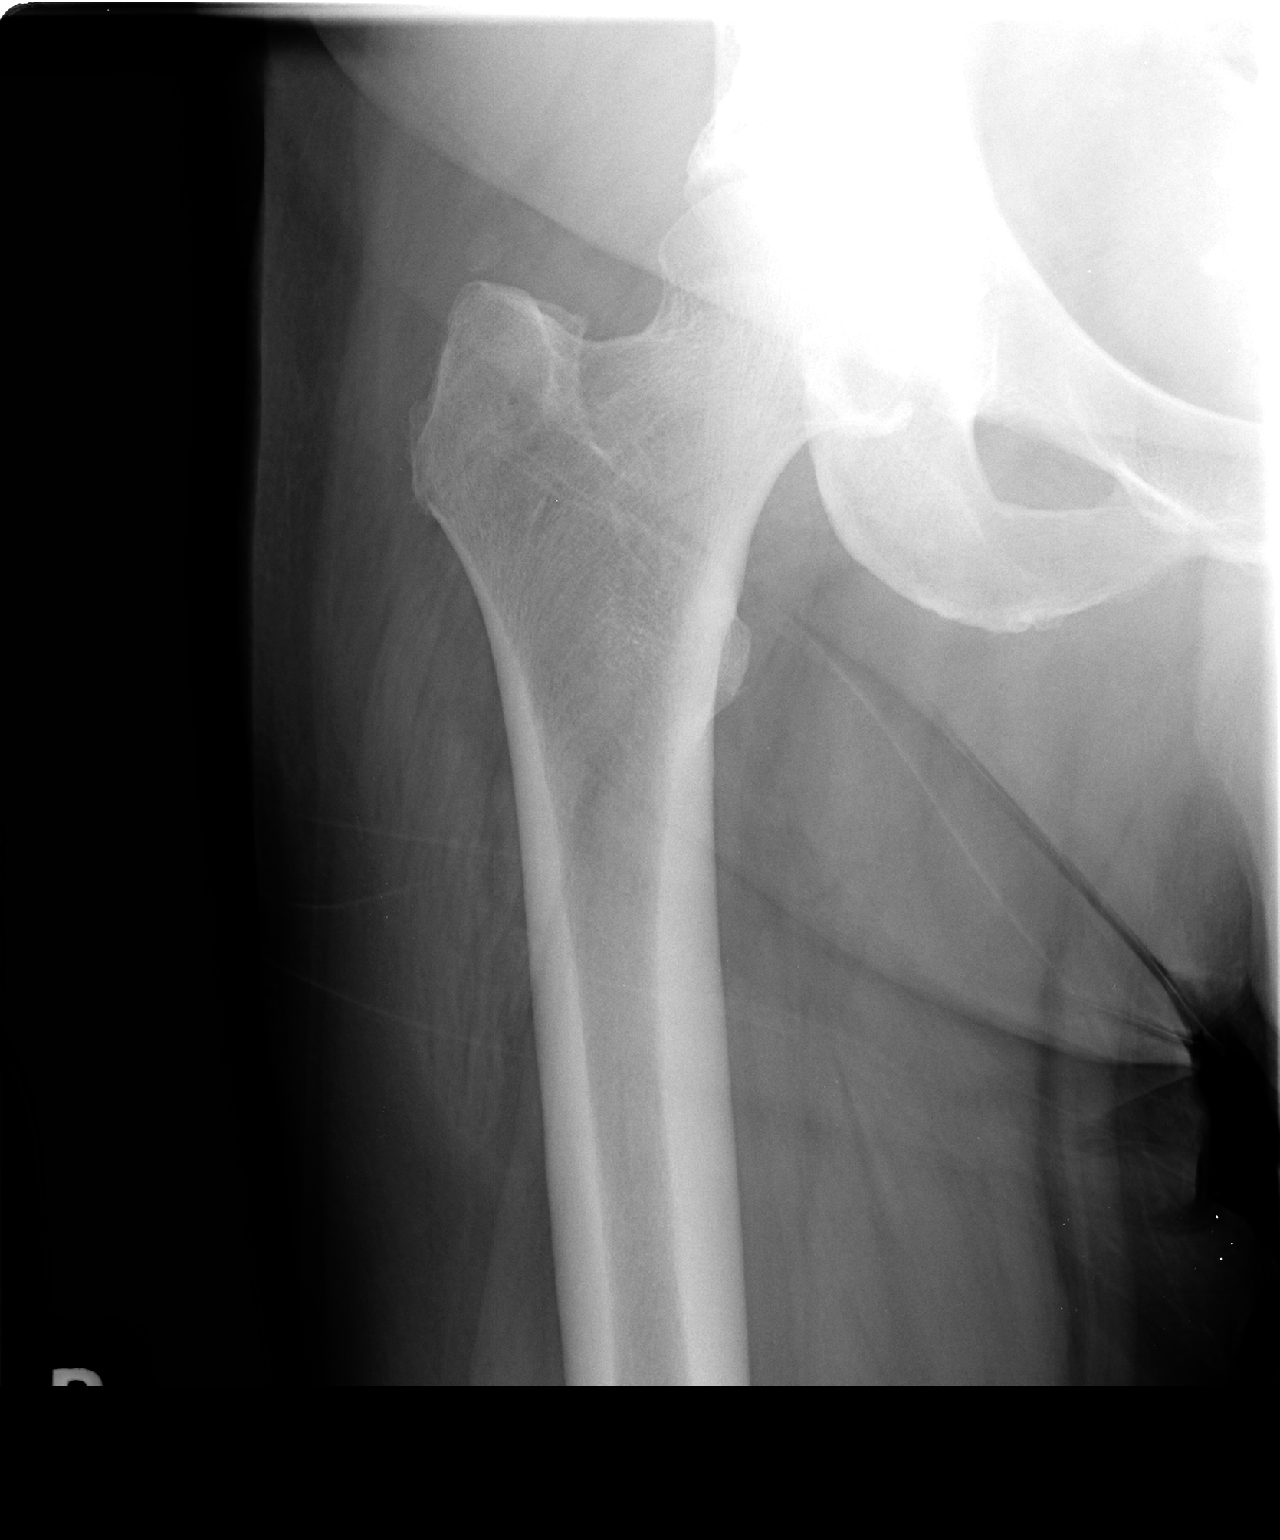

[view not recorded (3 of 3)]
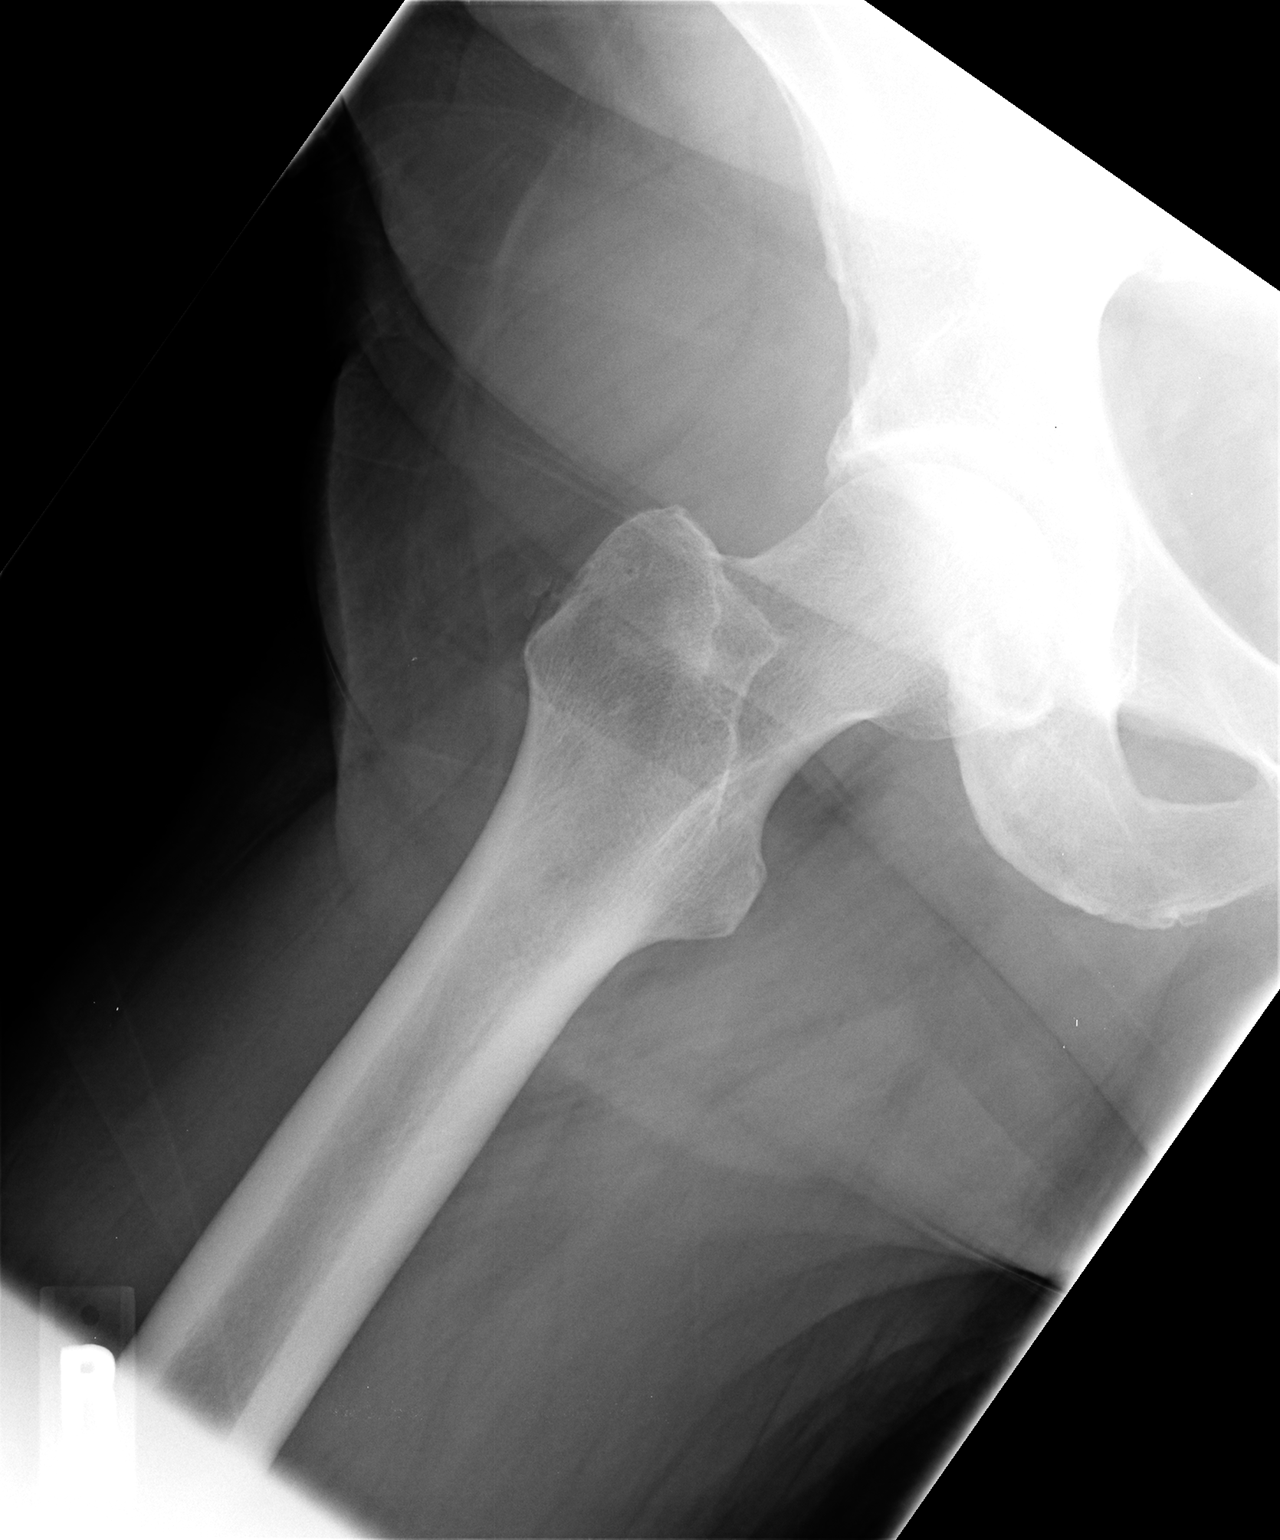

[3 of 3 positions shown; findings below may reference images not displayed]

FINDINGS: Fine detail is obscured by patient body habitus.
Degenerative changes are again noted in the lower lumbar spine in
its visualized aspects.  Mild right hip degenerative change
identified with osteophyte formation.  No acute fracture or
dislocation.
IMPRESSION: No acute finding.

## 2011-08-30 IMAGING — MR MR LUMBAR SPINE W/O CM
4 of 5 series · 13 of 48 positions shown · non-contrast
Comparison: MRI 01/24/2010

CLINICAL DATA: Low back pain with right leg pain.  Fall 3 weeks ago

MRI LUMBAR SPINE WITHOUT CONTRAST
TECHNIQUE: Multiplanar and multiecho pulse sequences of the lumbar
spine were obtained without intravenous contrast.

[Series 3: T2 · sagittal · 4.0mm · 0.33mm/px · 4 of 12 slices shown (1 of 2)]
[im 1/12]
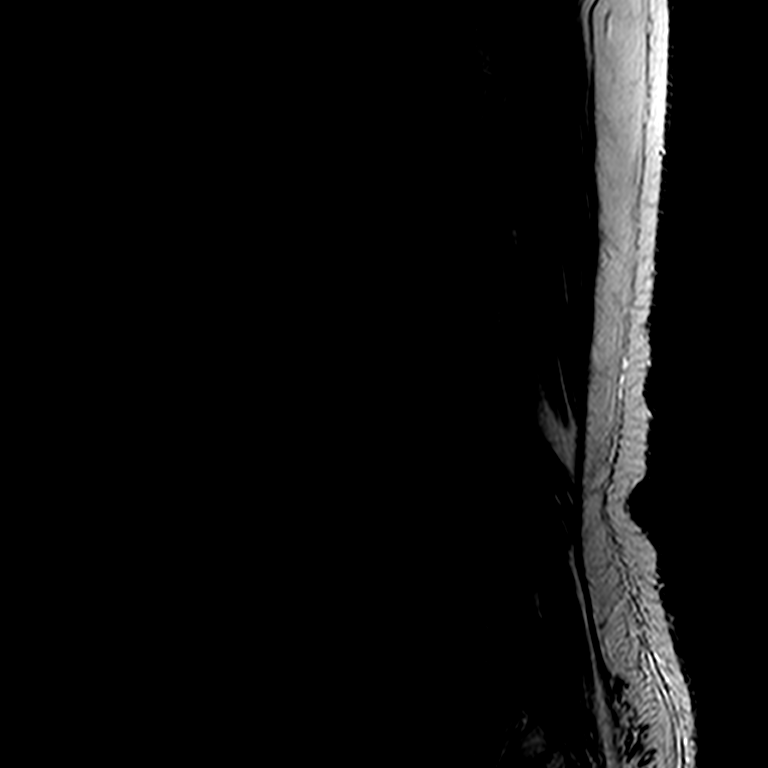
[im 3/12]
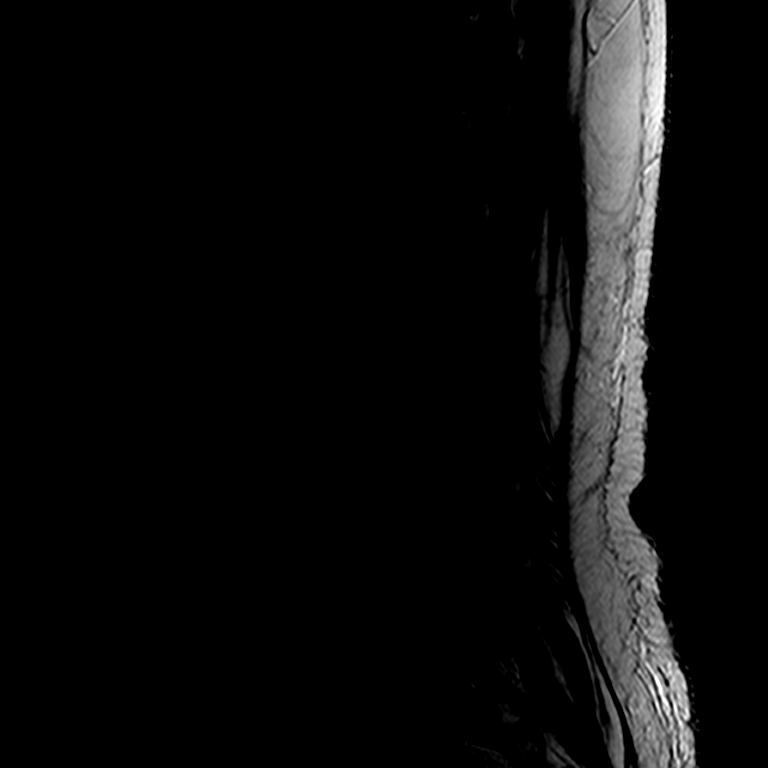
[im 7/12]
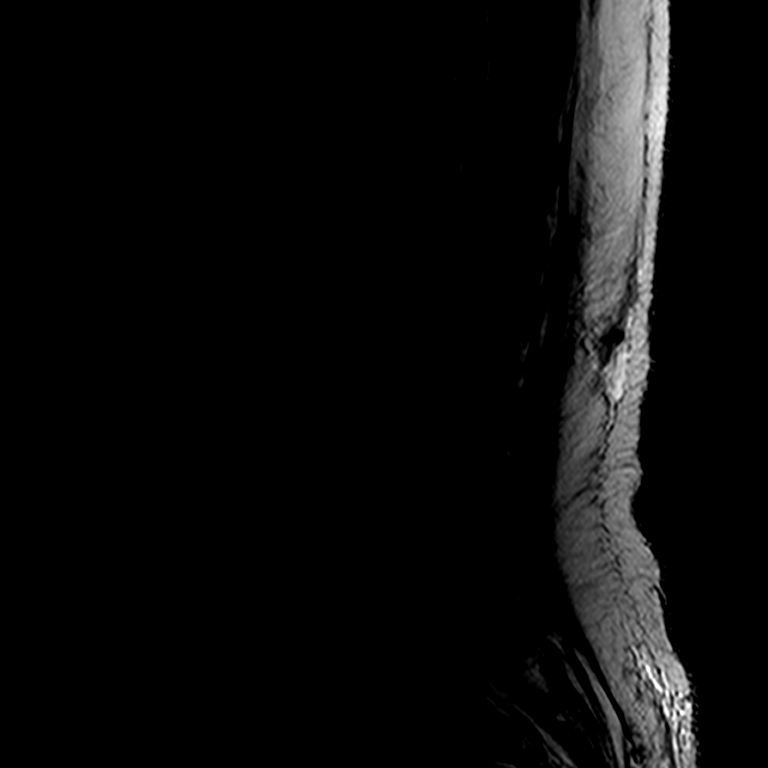
[im 12/12]
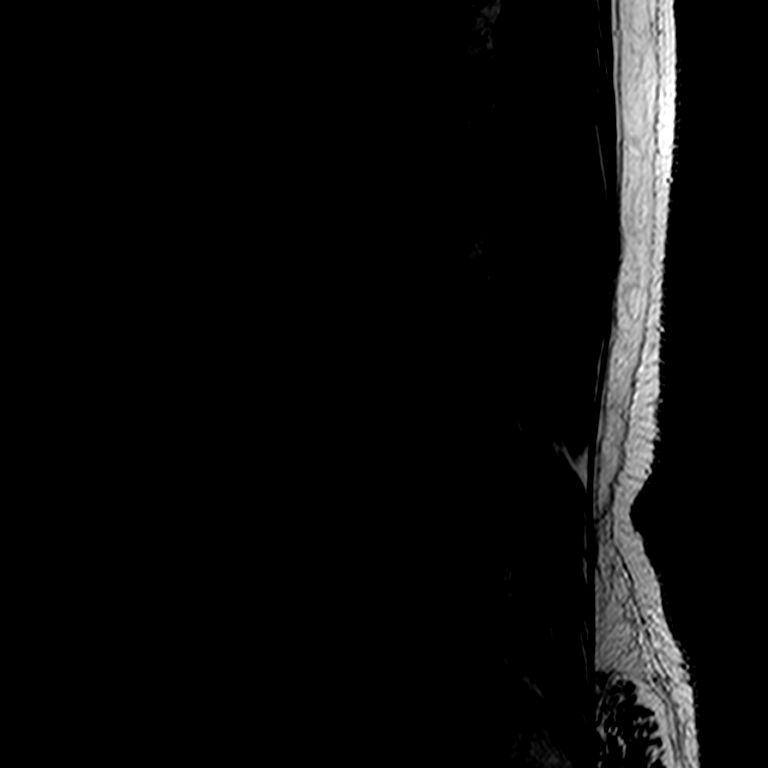

[Series 4: T1 · sagittal · 4.0mm · 0.39mm/px · 3 of 12 slices shown (1 of 2)]
[im 1/12]
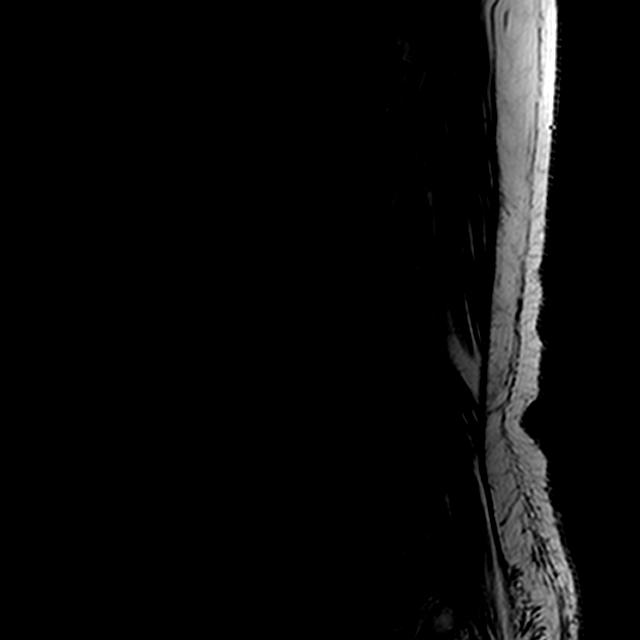
[im 6/12]
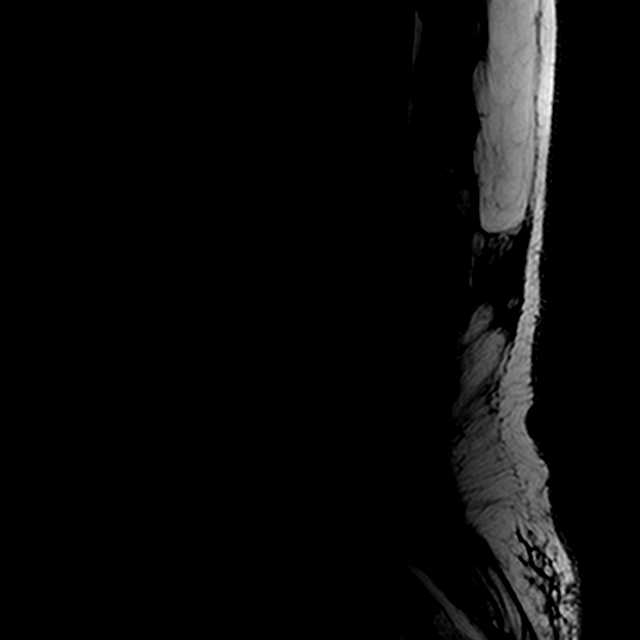
[im 12/12]
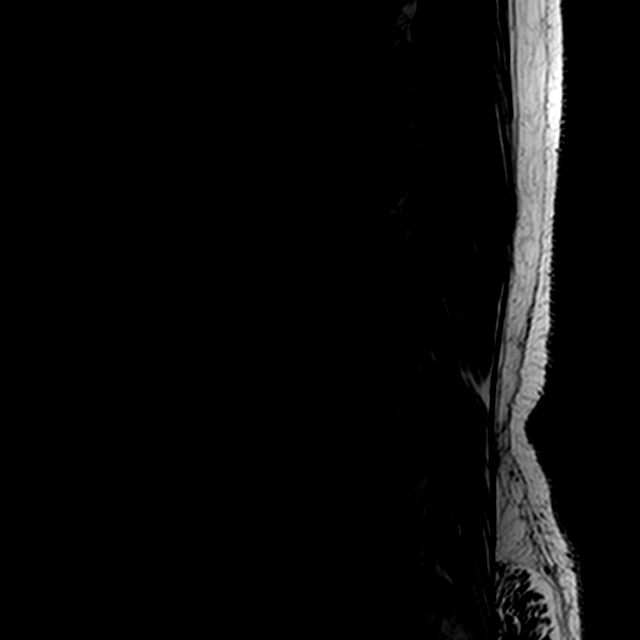

[Series 6: T2 · axial · 4.0mm · 0.29mm/px · z∈[-133,+8]mm · 3 of 36 slices shown (2 of 2)]
[im 5/36]
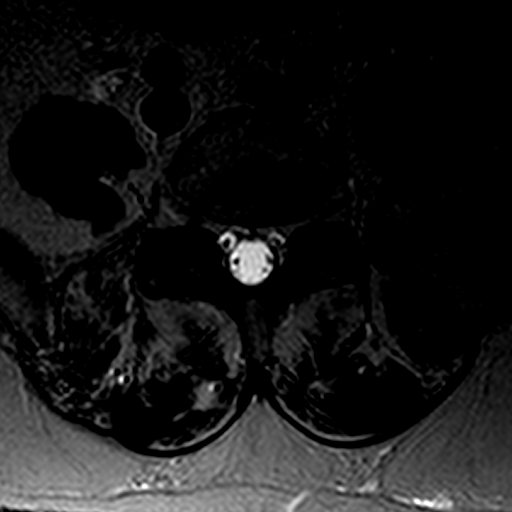
[im 19/36]
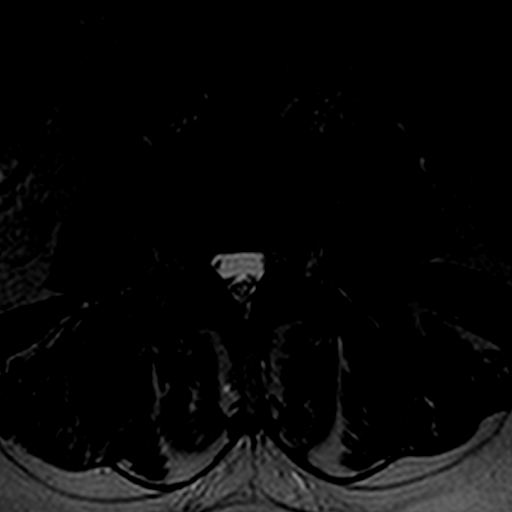
[im 31/36]
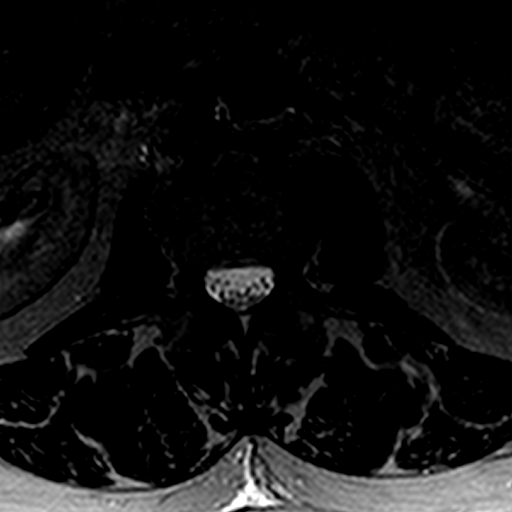

[Series 7: T1 · axial · 4.0mm · 0.31mm/px · z∈[-135,+9]mm · 3 of 36 slices shown (2 of 2)]
[im 5/36]
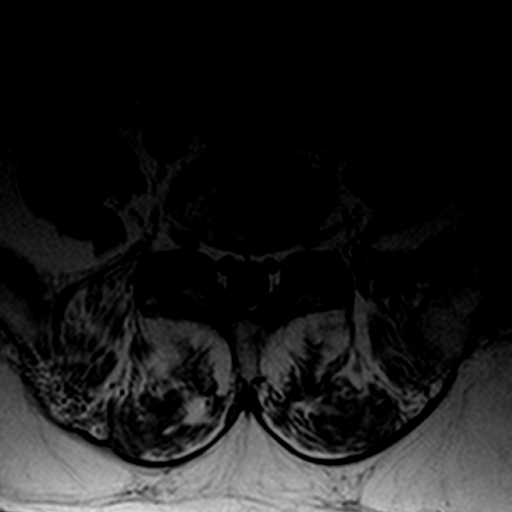
[im 19/36]
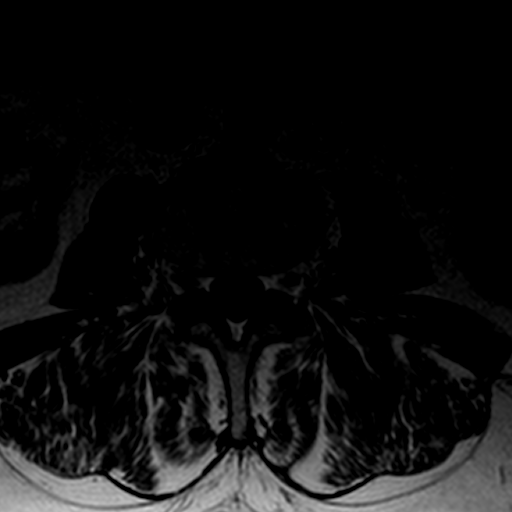
[im 31/36]
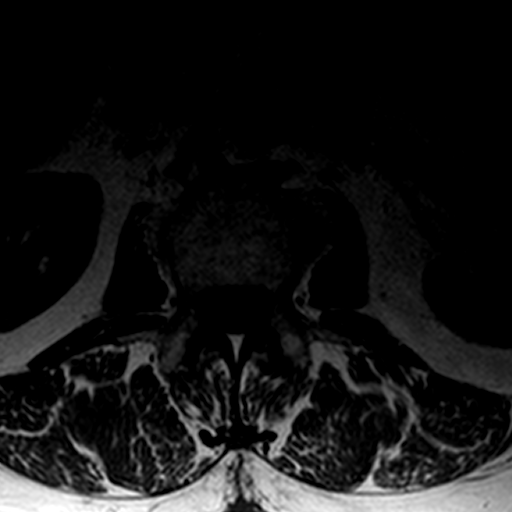

[13 of 48 positions shown; findings below may reference images not displayed]

FINDINGS: Grade 1 anterior slip L4-L5 is unchanged from the  prior
study.  Negative for acute or chronic fracture.  Negative for mass
lesion.  Conus medullaris is normal and terminates at L1

L1-2:  Disc degeneration with diffuse bulging of the disc which is
unchanged.  No significant stenosis.

L2-3:  Mild disc and mild facet degeneration.

L3-4:  Mild disc and facet degeneration.

L4-5:  Grade 1 anterior slip due to advanced facet degeneration.
This is causing mild spinal stenosis and mild foraminal narrowing
bilaterally.  Slight progression of spinal stenosis and foraminal
stenosis since the prior MRI.

L5-S1:  Mild disc degeneration.
IMPRESSION: Slight progression of mild spinal stenosis at L4-5 due to grade 1
slip and severe facet degeneration.  Mild degenerative changes at
other levels are stable.

## 2011-09-04 ENCOUNTER — Ambulatory Visit (HOSPITAL_COMMUNITY): Payer: Medicare Other | Admitting: Psychiatry

## 2011-09-04 ENCOUNTER — Encounter (HOSPITAL_COMMUNITY): Payer: Medicare Other | Admitting: Psychiatry

## 2011-09-17 ENCOUNTER — Other Ambulatory Visit (HOSPITAL_COMMUNITY): Payer: Self-pay | Admitting: Psychiatry

## 2011-09-18 ENCOUNTER — Ambulatory Visit (HOSPITAL_COMMUNITY): Payer: Medicare Other | Admitting: Psychiatry

## 2011-09-24 ENCOUNTER — Other Ambulatory Visit (HOSPITAL_COMMUNITY): Payer: Self-pay | Admitting: Psychiatry

## 2011-10-01 ENCOUNTER — Encounter: Payer: Self-pay | Admitting: Cardiology

## 2011-10-18 ENCOUNTER — Other Ambulatory Visit (HOSPITAL_COMMUNITY): Payer: Self-pay | Admitting: Psychiatry

## 2011-10-18 DIAGNOSIS — F419 Anxiety disorder, unspecified: Secondary | ICD-10-CM

## 2011-10-26 ENCOUNTER — Other Ambulatory Visit (HOSPITAL_COMMUNITY): Payer: Self-pay | Admitting: Psychiatry

## 2011-10-29 ENCOUNTER — Other Ambulatory Visit (HOSPITAL_COMMUNITY): Payer: Self-pay | Admitting: Psychiatry

## 2011-10-30 ENCOUNTER — Other Ambulatory Visit (HOSPITAL_COMMUNITY): Payer: Self-pay | Admitting: Psychiatry

## 2011-11-06 ENCOUNTER — Encounter (HOSPITAL_COMMUNITY): Payer: Self-pay | Admitting: Psychiatry

## 2011-11-06 ENCOUNTER — Ambulatory Visit (INDEPENDENT_AMBULATORY_CARE_PROVIDER_SITE_OTHER): Payer: Medicare Other | Admitting: Psychiatry

## 2011-11-06 DIAGNOSIS — F411 Generalized anxiety disorder: Secondary | ICD-10-CM

## 2011-11-06 DIAGNOSIS — F3130 Bipolar disorder, current episode depressed, mild or moderate severity, unspecified: Secondary | ICD-10-CM

## 2011-11-06 DIAGNOSIS — F419 Anxiety disorder, unspecified: Secondary | ICD-10-CM

## 2011-11-06 DIAGNOSIS — F319 Bipolar disorder, unspecified: Secondary | ICD-10-CM

## 2011-11-06 MED ORDER — TRAZODONE HCL 100 MG PO TABS: 100.0000 mg | ORAL_TABLET | Freq: Every day | ORAL | Status: DC

## 2011-11-06 MED ORDER — CITALOPRAM HYDROBROMIDE 40 MG PO TABS: 40.0000 mg | ORAL_TABLET | Freq: Every day | ORAL | Status: DC

## 2011-11-06 MED ORDER — ARIPIPRAZOLE 10 MG PO TABS: 10.0000 mg | ORAL_TABLET | Freq: Every day | ORAL | Status: DC

## 2011-11-06 MED ORDER — DULOXETINE HCL 60 MG PO CPEP: 60.0000 mg | ORAL_CAPSULE | Freq: Every day | ORAL | Status: DC

## 2011-11-06 MED ORDER — CLONAZEPAM 0.5 MG PO TABS: ORAL_TABLET | ORAL | Status: DC

## 2011-11-06 NOTE — Progress Notes (Signed)
Patient came for her followup appointment. She has been stable on her current medication. She is sleeping good and like her current dose of medication. She was upset on Christmas as her son did not come to visit. Overall she reported her mood has been stable. She denies any agitation anger or mood swings. She denies any side effects of medication. She is taking Cymbalta Klonopin trazodone and Abilify.  Mental status examination Patient is casually dressed and well-groomed. She is much calmer and maintained good eye contact. She described her mood is good and her affect is bright and mood congruent. She denies any active or passive suicidal thoughts or homicidal thoughts. There no psychotic symptoms present at this time. She's alert and and at x3. Her attention and concentration is okay. Her insight judgment and pulse control is okay. There no extrapyramidal side effects or tremors present.  Assessment Bipolar disorder NOS  Plan I will continue her current medication which is Klonopin trazodone Cymbalta and Abilify. I have explained risks and benefits of medication in detail. She does not drink or use drugs or asking early refill of her Klonopin. I will see her again in 2 months.

## 2011-12-28 ENCOUNTER — Other Ambulatory Visit (HOSPITAL_COMMUNITY): Payer: Self-pay | Admitting: Psychiatry

## 2012-01-03 ENCOUNTER — Ambulatory Visit (INDEPENDENT_AMBULATORY_CARE_PROVIDER_SITE_OTHER): Payer: Self-pay | Admitting: Psychiatry

## 2012-01-03 ENCOUNTER — Encounter (HOSPITAL_COMMUNITY): Payer: Self-pay | Admitting: Psychiatry

## 2012-01-03 VITALS — Wt 196.0 lb

## 2012-01-03 DIAGNOSIS — F419 Anxiety disorder, unspecified: Secondary | ICD-10-CM

## 2012-01-03 DIAGNOSIS — F319 Bipolar disorder, unspecified: Secondary | ICD-10-CM

## 2012-01-03 DIAGNOSIS — F411 Generalized anxiety disorder: Secondary | ICD-10-CM

## 2012-01-03 DIAGNOSIS — F3130 Bipolar disorder, current episode depressed, mild or moderate severity, unspecified: Secondary | ICD-10-CM

## 2012-01-03 MED ORDER — TRAZODONE HCL 100 MG PO TABS: 100.0000 mg | ORAL_TABLET | Freq: Every day | ORAL | Status: DC

## 2012-01-03 MED ORDER — CLONAZEPAM 0.5 MG PO TABS: ORAL_TABLET | ORAL | Status: DC

## 2012-01-03 MED ORDER — DULOXETINE HCL 60 MG PO CPEP: 60.0000 mg | ORAL_CAPSULE | Freq: Every day | ORAL | Status: DC

## 2012-01-03 MED ORDER — ARIPIPRAZOLE 10 MG PO TABS: 10.0000 mg | ORAL_TABLET | Freq: Every day | ORAL | Status: DC
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
## 2012-03-06 ENCOUNTER — Encounter (HOSPITAL_COMMUNITY): Payer: Self-pay | Admitting: Psychiatry

## 2012-03-06 ENCOUNTER — Ambulatory Visit (INDEPENDENT_AMBULATORY_CARE_PROVIDER_SITE_OTHER): Payer: Self-pay | Admitting: Psychiatry

## 2012-03-06 VITALS — Wt 190.0 lb

## 2012-03-06 DIAGNOSIS — F3130 Bipolar disorder, current episode depressed, mild or moderate severity, unspecified: Secondary | ICD-10-CM

## 2012-03-06 DIAGNOSIS — F419 Anxiety disorder, unspecified: Secondary | ICD-10-CM

## 2012-03-06 DIAGNOSIS — Z79899 Other long term (current) drug therapy: Secondary | ICD-10-CM

## 2012-03-06 DIAGNOSIS — F411 Generalized anxiety disorder: Secondary | ICD-10-CM

## 2012-03-06 DIAGNOSIS — F319 Bipolar disorder, unspecified: Secondary | ICD-10-CM

## 2012-03-06 MED ORDER — DULOXETINE HCL 60 MG PO CPEP: 60.0000 mg | ORAL_CAPSULE | Freq: Every day | ORAL | Status: DC

## 2012-03-06 MED ORDER — TRAZODONE HCL 100 MG PO TABS: 100.0000 mg | ORAL_TABLET | Freq: Every day | ORAL | Status: DC

## 2012-03-06 MED ORDER — ARIPIPRAZOLE 5 MG PO TABS: 5.0000 mg | ORAL_TABLET | Freq: Every day | ORAL | Status: DC

## 2012-03-06 MED ORDER — CLONAZEPAM 0.5 MG PO TABS: ORAL_TABLET | ORAL | Status: DC
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
## 2012-05-03 LAB — COMPREHENSIVE METABOLIC PANEL
ALT: 13 U/L (ref 0–35)
Albumin: 3.7 g/dL (ref 3.5–5.2)
CO2: 24 mEq/L (ref 19–32)
Calcium: 9.2 mg/dL (ref 8.4–10.5)
Chloride: 105 mEq/L (ref 96–112)
Sodium: 143 mEq/L (ref 135–145)
Total Protein: 6.8 g/dL (ref 6.0–8.3)

## 2012-05-03 LAB — CBC WITH DIFFERENTIAL/PLATELET
Eosinophils Relative: 4 % (ref 0–5)
Lymphocytes Relative: 36 % (ref 12–46)
Lymphs Abs: 2.3 10*3/uL (ref 0.7–4.0)
MCV: 90.6 fL (ref 78.0–100.0)
Neutrophils Relative %: 50 % (ref 43–77)
Platelets: 251 10*3/uL (ref 150–400)
RBC: 3.84 MIL/uL — ABNORMAL LOW (ref 3.87–5.11)
WBC: 6.3 10*3/uL (ref 4.0–10.5)

## 2012-05-06 ENCOUNTER — Encounter (HOSPITAL_COMMUNITY): Payer: Self-pay | Admitting: Psychiatry

## 2012-05-06 ENCOUNTER — Ambulatory Visit (INDEPENDENT_AMBULATORY_CARE_PROVIDER_SITE_OTHER): Payer: Self-pay | Admitting: Psychiatry

## 2012-05-06 VITALS — BP 151/83 | HR 60 | Wt 190.0 lb

## 2012-05-06 DIAGNOSIS — F319 Bipolar disorder, unspecified: Secondary | ICD-10-CM

## 2012-05-06 MED ORDER — TRAZODONE HCL 100 MG PO TABS: 100.0000 mg | ORAL_TABLET | Freq: Every day | ORAL | Status: DC

## 2012-05-06 MED ORDER — ARIPIPRAZOLE 5 MG PO TABS: 5.0000 mg | ORAL_TABLET | Freq: Every day | ORAL | Status: DC

## 2012-05-06 MED ORDER — DULOXETINE HCL 60 MG PO CPEP: 60.0000 mg | ORAL_CAPSULE | Freq: Every day | ORAL | Status: DC

## 2012-05-06 MED ORDER — BENZTROPINE MESYLATE 0.5 MG PO TABS: 0.5000 mg | ORAL_TABLET | Freq: Every day | ORAL | Status: DC
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
## 2012-05-13 ENCOUNTER — Other Ambulatory Visit (HOSPITAL_COMMUNITY): Payer: Self-pay | Admitting: *Deleted

## 2012-05-13 ENCOUNTER — Other Ambulatory Visit (HOSPITAL_COMMUNITY): Payer: Self-pay | Admitting: Psychiatry

## 2012-05-13 DIAGNOSIS — F419 Anxiety disorder, unspecified: Secondary | ICD-10-CM

## 2012-05-13 DIAGNOSIS — F319 Bipolar disorder, unspecified: Secondary | ICD-10-CM

## 2012-05-13 MED ORDER — CLONAZEPAM 0.5 MG PO TABS: ORAL_TABLET | ORAL | Status: DC

## 2012-07-03 ENCOUNTER — Other Ambulatory Visit (HOSPITAL_COMMUNITY): Payer: Self-pay | Admitting: Psychiatry

## 2012-07-06 ENCOUNTER — Encounter (HOSPITAL_COMMUNITY): Payer: Self-pay

## 2012-07-06 ENCOUNTER — Emergency Department (HOSPITAL_COMMUNITY): Payer: Medicare Other

## 2012-07-06 ENCOUNTER — Emergency Department (HOSPITAL_COMMUNITY)
Admission: EM | Admit: 2012-07-06 | Discharge: 2012-07-06 | Disposition: A | Discharge status: Home / Self Care | Payer: Medicare Other | Attending: Emergency Medicine | Admitting: Emergency Medicine

## 2012-07-06 DIAGNOSIS — T07XXXA Unspecified multiple injuries, initial encounter: Secondary | ICD-10-CM | POA: Insufficient documentation

## 2012-07-06 DIAGNOSIS — S51809A Unspecified open wound of unspecified forearm, initial encounter: Secondary | ICD-10-CM | POA: Insufficient documentation

## 2012-07-06 DIAGNOSIS — R079 Chest pain, unspecified: Secondary | ICD-10-CM | POA: Insufficient documentation

## 2012-07-06 DIAGNOSIS — I252 Old myocardial infarction: Secondary | ICD-10-CM | POA: Insufficient documentation

## 2012-07-06 DIAGNOSIS — E039 Hypothyroidism, unspecified: Secondary | ICD-10-CM | POA: Insufficient documentation

## 2012-07-06 DIAGNOSIS — E119 Type 2 diabetes mellitus without complications: Secondary | ICD-10-CM | POA: Insufficient documentation

## 2012-07-06 DIAGNOSIS — W010XXA Fall on same level from slipping, tripping and stumbling without subsequent striking against object, initial encounter: Secondary | ICD-10-CM | POA: Insufficient documentation

## 2012-07-06 DIAGNOSIS — W19XXXA Unspecified fall, initial encounter: Secondary | ICD-10-CM

## 2012-07-06 DIAGNOSIS — M542 Cervicalgia: Secondary | ICD-10-CM | POA: Insufficient documentation

## 2012-07-06 DIAGNOSIS — S51819A Laceration without foreign body of unspecified forearm, initial encounter: Secondary | ICD-10-CM

## 2012-07-06 DIAGNOSIS — M25559 Pain in unspecified hip: Secondary | ICD-10-CM | POA: Insufficient documentation

## 2012-07-06 DIAGNOSIS — Z79899 Other long term (current) drug therapy: Secondary | ICD-10-CM | POA: Insufficient documentation

## 2012-07-06 MED ORDER — OXYCODONE-ACETAMINOPHEN 5-325 MG PO TABS
1.0000 | ORAL_TABLET | Freq: Once | ORAL | Status: AC
  Administered 2012-07-06: 1 via ORAL
  Filled 2012-07-06: qty 1

## 2012-07-06 MED ORDER — TETANUS-DIPHTHERIA TOXOIDS TD 5-2 LFU IM INJ
0.5000 mL | INJECTION | Freq: Once | INTRAMUSCULAR | Status: AC
  Administered 2012-07-06: 0.5 mL via INTRAMUSCULAR
  Filled 2012-07-06: qty 0.5

## 2012-07-06 MED ORDER — OXYCODONE-ACETAMINOPHEN 5-325 MG PO TABS: 1.0000 | ORAL_TABLET | ORAL | Status: AC | PRN

## 2012-07-06 NOTE — ED Notes (Signed)
Pt states that she tripped over her feet today, falling on her left side, pt has abrasion to left forearm area, abrasion cleaned with saline, telfa dressing applied, pt has pain to left elbow/forearm area and left hip. Pt states that she fell around 9 this am

## 2012-07-06 NOTE — ED Notes (Signed)
Pt tripped and fell this am, landed on left side, cont. To have left hip pain and left arm pain, denies loc.

## 2012-07-06 NOTE — ED Notes (Signed)
Pt returned from ct scan, update given on plan of care,

## 2012-07-06 NOTE — ED Provider Notes (Signed)
History     CSN: [id number]  Arrival date & time 07/06/12  1521   First MD Initiated Contact with Patient 07/06/12 1553      Chief Complaint  Patient presents with  . Fall    (Consider location/radiation/quality/duration/timing/severity/associated sxs/prior treatment) HPI Comments: Patient states she tripped, fell on a hard floor.  C/o skin tear to her forearm and pain to her left hip, forearm, left neck and left chest wall. She states the fall occurred earlier on the day of arrival and pain became progressively worse.  Pain to hip is worse with weight bearing.  Also c/o swelling to her hip.  She denies sx's prior to the fall, head injury, headaches, vomiting, chest pain, abd pain or LOC.  Patient is a 61 y.o. female presenting with fall. The history is provided by the patient.  Fall The accident occurred 6 to 12 hours ago. The fall occurred while walking. Distance fallen: from standing position. She landed on a hard floor. There was no blood loss. The point of impact was the left hip (left forearm). The pain is present in the left hip (neck, left forearm, left ribs). The pain is mild. She was ambulatory at the scene. There was no entrapment after the fall. There was no drug use involved in the accident. There was no alcohol use involved in the accident. Pertinent negatives include no visual change, no fever, no numbness, no abdominal pain, no bowel incontinence, no nausea, no vomiting, no hematuria, no headaches, no hearing loss, no loss of consciousness and no tingling. The symptoms are aggravated by activity, standing, ambulation, pressure on the injury and flexion. She has tried nothing for the symptoms. The treatment provided no relief.    Past Medical History  Diagnosis Date  . Diabetes mellitus   . COPD (chronic obstructive pulmonary disease)   . ASCVD (arteriosclerotic cardiovascular disease)     MI in 96 requiring BMS CX; DES to M1 in 2000;normal coronary angiography in 2004  .  Hyperlipidemia   . Hypothyroidism   . Tobacco abuse   . Depression   . DVT (deep venous thrombosis)   . Nephrolithiasis 2006    stone extraction   . Amputation of hand, right     traumatic  . Allergic rhinitis   . Cholelithiasis   . GERD (gastroesophageal reflux disease)   . Low back pain   . Peripheral neuropathy     Past Surgical History  Procedure Date  . Dilation and curettage of uterus 1974  . Partial hysterectomy 1978  . Total abdominal hysterectomy w/ bilateral salpingoophorectomy 2002  . Shoulder surgery     Left shoulder for RTC;left arm surgery '98/left hand surgery 2001  . Cholecystectomy   . Umbilical hernia repair 0102  . Colonoscopy 01/2009    nl repeat in 5  years    No family history on file.  History  Substance Use Topics  . Smoking status: Smoker, Current Status Unknown -- 0.5 packs/day for 40 years    Types: Cigarettes  . Smokeless tobacco: Not on file  . Alcohol Use: No    OB History    Grav Para Term Preterm Abortions TAB SAB Ect Mult Living                  Review of Systems  Constitutional: Negative for fever, chills, activity change and appetite change.  HENT: Positive for neck pain. Negative for facial swelling.   Eyes: Negative for visual disturbance.  Respiratory: Negative for  shortness of breath.   Cardiovascular: Negative for chest pain.  Gastrointestinal: Negative for nausea, vomiting, abdominal pain and bowel incontinence.  Genitourinary: Negative for dysuria, hematuria and difficulty urinating.  Musculoskeletal: Positive for joint swelling and arthralgias. Negative for back pain and gait problem.  Skin: Positive for wound. Negative for color change.       Skin tear left arm  Neurological: Negative for dizziness, tingling, loss of consciousness, syncope, facial asymmetry, weakness, light-headedness, numbness and headaches.  Hematological: Does not bruise/bleed easily.  All other systems reviewed and are negative.    Allergies   Ciprofloxacin; Iohexol; Neomycin-bacitracin zn-polymyx; Penicillins; and Povidone  Home Medications   Current Outpatient Rx  Name Route Sig Dispense Refill  . ARIPIPRAZOLE 5 MG PO TABS Oral Take 1 tablet (5 mg total) by mouth daily. 30 tablet 1  . ASPIRIN 81 MG PO CHEW Oral Chew 81 mg by mouth daily.    Marland Kitchen BENZTROPINE MESYLATE 0.5 MG PO TABS  TAKE ONE TABLET BY MOUTH EVERY DAY 30 tablet 0  . CLINDAMYCIN HCL 300 MG PO CAPS Oral Take 300 mg by mouth 3 (three) times daily.     . COLESEVELAM HCL 625 MG PO TABS Oral Take 1,875 mg by mouth 2 (two) times daily with a meal.      . DULOXETINE HCL 60 MG PO CPEP Oral Take 1 capsule (60 mg total) by mouth daily. 30 capsule 1  . ESOMEPRAZOLE MAGNESIUM 40 MG PO CPDR Oral Take 40 mg by mouth daily before breakfast.      . OMEGA-3 FATTY ACIDS 1000 MG PO CAPS Oral Take 2 g by mouth daily.      . GLYBURIDE 5 MG PO TABS Oral Take 5 mg by mouth 2 (two) times daily.     Marland Kitchen LEVOTHYROXINE SODIUM 200 MCG PO TABS Oral Take 400 mcg by mouth daily.    Marland Kitchen LISINOPRIL 20 MG PO TABS Oral Take 20 mg by mouth daily.      Marland Kitchen METFORMIN HCL 500 MG PO TABS Oral Take 500 mg by mouth 2 (two) times daily.    Marland Kitchen METOPROLOL TARTRATE 50 MG PO TABS Oral Take 25 mg by mouth 2 (two) times daily.     Marland Kitchen NAPROXEN 500 MG PO TABS Oral Take 500 mg by mouth 2 (two) times daily.     Marland Kitchen NIACIN ER (ANTIHYPERLIPIDEMIC) 1000 MG PO TBCR Oral Take 1,000 mg by mouth at bedtime.      Marland Kitchen PRAVASTATIN SODIUM 40 MG PO TABS Oral Take 40 mg by mouth daily.      Marland Kitchen PROMETHAZINE HCL 25 MG PO TABS Oral Take 25 mg by mouth every 6 (six) hours as needed. nausea    . TRAZODONE HCL 100 MG PO TABS Oral Take 1 tablet (100 mg total) by mouth at bedtime. 30 tablet 1  . BECLOMETHASONE DIPROPIONATE 40 MCG/ACT IN AERS Inhalation Inhale 2 puffs into the lungs 2 (two) times daily.      Marland Kitchen NITROGLYCERIN 0.4 MG SL SUBL Sublingual Place 0.4 mg under the tongue every 5 (five) minutes as needed. Chest pain      BP 127/60  Pulse 72   Temp 98.4 F (36.9 C) (Oral)  Resp 20  Ht 5' 6.5" (1.689 m)  Wt 190 lb (86.183 kg)  BMI 30.21 kg/m2  SpO2 98%  Physical Exam  Nursing note and vitals reviewed. Constitutional: She is oriented to person, place, and time. She appears well-developed and well-nourished. No distress.  HENT:  Head:  Normocephalic and atraumatic.  Mouth/Throat: Oropharynx is clear and moist.  Eyes: Conjunctivae normal and EOM are normal. Pupils are equal, round, and reactive to light.  Neck: Normal range of motion.       See MS exam  Cardiovascular: Normal rate, regular rhythm, normal heart sounds and intact distal pulses.   No murmur heard. Pulmonary/Chest: Effort normal and breath sounds normal.  Abdominal: Soft. Bowel sounds are normal. She exhibits no distension and no mass. There is no tenderness. There is no rebound and no guarding.  Musculoskeletal: She exhibits edema and tenderness.       Left hip: She exhibits tenderness, bony tenderness and swelling. She exhibits normal range of motion, normal strength, no crepitus, no deformity and no laceration.       Cervical back: She exhibits tenderness. She exhibits normal range of motion, no bony tenderness, no swelling, no edema, no deformity, no laceration, no spasm and normal pulse.       Back:       Left forearm: She exhibits tenderness and laceration. She exhibits no bony tenderness, no swelling, no edema and no deformity.       Arms:      Legs:      Pt has a right hand amputation.  Stump appears well healed.  Left radial pulse is brisk, distal sensation intact,  CR< 3 sec.  Pt has full ROM of the left wrist and elbow with mild tenderness to palp over the forearm.    Lymphadenopathy:    She has no cervical adenopathy.  Neurological: She is alert and oriented to person, place, and time. Coordination normal.  Skin: Skin is warm and dry.  Psychiatric: Her behavior is normal.    ED Course  Procedures (including critical care time)  Labs Reviewed -  No data to display Dg Ribs Unilateral W/chest Left  07/06/2012  *RADIOLOGY REPORT*  Clinical Data: Left posterior rib pain.  LEFT RIBS AND CHEST - 3+ VIEW  Comparison: 08/19/2010  Findings: Stable interstitial accentuation in the lung bases. Thoracic spondylosis noted.  There is atherosclerotic calcification in the aortic arch.  No pneumothorax or pleural effusion identified.  Left AC joint resection or resorption noted in the past.  No rib fracture identified.  IMPRESSION:  1.  No rib fracture identified. Please note that nondisplaced rib fractures can be occult on conventional radiography. 2.  Thoracic spondylosis. 3.  Left AC joint resection or resorption noted.   Original Report Authenticated By: Carron Curie, M.D.    Dg Cervical Spine Complete  07/06/2012  *RADIOLOGY REPORT*  Clinical Data: Fall onto floor.  Left body pain.  CERVICAL SPINE - COMPLETE 4+ VIEW  Comparison: 10/20 12/2004  Findings: Cervical spondylosis again observed at C5-6 and C6-7.  No malalignment noted.  C7-T1 are slightly obscured by bony structures projecting over than on the swimmer's view, and are obscured on the standard lateral projection by the patient's shoulders.  No prevertebral soft tissue swelling noted.  The patient's earring partially covers the ring of C1 on the lateral projection.  The neural foramina are poorly seen on the oblique views due to the degree of obliquity employed.  IMPRESSION:  1.  No fracture or static instability identified.  Lower cervical spondylosis.  If there is a high clinical index of suspicion of cervical spine fracture based on physical exam and clinical assessment, CT would be recommended for higher sensitivity.   Original Report Authenticated By: Carron Curie, M.D.    Dg Forearm  Left  07/06/2012  *RADIOLOGY REPORT*  Clinical Data: Fall.  Left forearm laceration.  LEFT FOREARM - 2 VIEW  Comparison: None.  Findings: No fracture, foreign body, or acute bony findings are  identified.  IMPRESSION:  No significant abnormality identified.   Original Report Authenticated By: Carron Curie, M.D.    Dg Hip Complete Left  07/06/2012  *RADIOLOGY REPORT*  Clinical Data: Fall.  Left body pain.  LEFT HIP - COMPLETE 2+ VIEW  Comparison: 12/12/2007  Findings: Inlet view the pelvis appears unremarkable.  No hip fracture observed.  Chronic spurring of the greater trochanter identified.  IMPRESSION: 1.  No acute bony findings.   Original Report Authenticated By: Carron Curie, M.D.    Ct Cervical Spine Wo Contrast  07/06/2012  *RADIOLOGY REPORT*  Clinical Data: Fall  CT CERVICAL SPINE WITHOUT CONTRAST  Technique:  Multidetector CT imaging of the cervical spine was performed. Multiplanar CT image reconstructions were also generated.  Comparison: X-ray cervical spine same day  Findings: Axial images of the cervical spine shows no acute fracture or subluxation.  There is no pneumothorax in visualized lung apices.  Sagittal reconstructed images shows no acute fracture or subluxation.  Degenerative changes are noted C1-C2 articulation. No prevertebral soft tissue swelling.  There is a mild disc space flattening with large anterior osteophytes at C5-C6 and C6-C7 level.  No prevertebral soft tissue swelling.  Cervical airway is patent.  Coronal reconstructed images shows no acute fracture or subluxation.  IMPRESSION: No acute fracture or subluxation.  Degenerative changes as described above.   Original Report Authenticated By: Lahoma Crocker, M.D.         MDM     Patient is feeling better,  Pain improved.  Asymptomatic prior to fall.  No head trauma or neuro sx's.  Able to ambulate with a slight limp.  No focal neuro deficits on exam.  Pt has appt with her PMD tomorrow morning,     No foacl neuro deficits on exam.  Pt agrees to return here if sx's worsen.    The patient appears reasonably screened and/or stabilized for discharge and I doubt any other medical condition or  other [facility] requiring further screening, evaluation, or treatment in the ED at this time prior to discharge.    rx: Percocet #[id number]   Ladonne Sharples L. [place], [place] 07/08/12 2212

## 2012-07-08 ENCOUNTER — Ambulatory Visit (HOSPITAL_COMMUNITY): Payer: Self-pay | Admitting: Psychiatry

## 2012-07-09 NOTE — ED Provider Notes (Signed)
Medical screening examination/treatment/procedure(s) were performed by non-physician practitioner and as supervising physician I was immediately available for consultation/collaboration.  Nat Christen, MD 07/09/12 [phone]

## 2012-07-18 ENCOUNTER — Other Ambulatory Visit (HOSPITAL_COMMUNITY): Payer: Self-pay | Admitting: Psychiatry

## 2012-07-21 ENCOUNTER — Other Ambulatory Visit (HOSPITAL_COMMUNITY): Payer: Self-pay | Admitting: Psychiatry

## 2012-07-21 DIAGNOSIS — F319 Bipolar disorder, unspecified: Secondary | ICD-10-CM

## 2012-07-21 MED ORDER — TRAZODONE HCL 100 MG PO TABS: 100.0000 mg | ORAL_TABLET | Freq: Every day | ORAL | Status: DC

## 2012-07-22 ENCOUNTER — Ambulatory Visit (INDEPENDENT_AMBULATORY_CARE_PROVIDER_SITE_OTHER): Payer: Self-pay | Admitting: Psychiatry

## 2012-07-22 ENCOUNTER — Other Ambulatory Visit (HOSPITAL_COMMUNITY): Payer: Self-pay

## 2012-07-22 ENCOUNTER — Encounter (HOSPITAL_COMMUNITY): Payer: Self-pay | Admitting: Psychiatry

## 2012-07-22 DIAGNOSIS — F319 Bipolar disorder, unspecified: Secondary | ICD-10-CM

## 2012-07-22 DIAGNOSIS — F3130 Bipolar disorder, current episode depressed, mild or moderate severity, unspecified: Secondary | ICD-10-CM

## 2012-07-22 MED ORDER — TRAZODONE HCL 100 MG PO TABS: 100.0000 mg | ORAL_TABLET | Freq: Every day | ORAL | Status: DC

## 2012-07-22 MED ORDER — ARIPIPRAZOLE 5 MG PO TABS: 5.0000 mg | ORAL_TABLET | Freq: Every day | ORAL | Status: DC

## 2012-07-22 MED ORDER — BENZTROPINE MESYLATE 0.5 MG PO TABS: 0.5000 mg | ORAL_TABLET | Freq: Every day | ORAL | Status: DC

## 2012-07-22 MED ORDER — CLONAZEPAM 0.5 MG PO TABS: ORAL_TABLET | ORAL | Status: DC
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
## 2012-08-02 ENCOUNTER — Other Ambulatory Visit (HOSPITAL_COMMUNITY): Payer: Self-pay | Admitting: Psychiatry

## 2012-08-03 ENCOUNTER — Other Ambulatory Visit (HOSPITAL_COMMUNITY): Payer: Self-pay | Admitting: Psychiatry

## 2012-08-16 ENCOUNTER — Other Ambulatory Visit (HOSPITAL_COMMUNITY): Payer: Self-pay | Admitting: Psychiatry

## 2012-08-18 ENCOUNTER — Other Ambulatory Visit (HOSPITAL_COMMUNITY): Payer: Self-pay | Admitting: Psychiatry

## 2012-08-18 NOTE — Telephone Encounter (Signed)
Given on 07/23/12 with additional refill. Too soon to refill

## 2012-09-02 ENCOUNTER — Ambulatory Visit (INDEPENDENT_AMBULATORY_CARE_PROVIDER_SITE_OTHER): Payer: Self-pay | Admitting: Psychiatry

## 2012-09-02 ENCOUNTER — Ambulatory Visit (HOSPITAL_COMMUNITY): Payer: Self-pay | Admitting: Psychiatry

## 2012-09-02 ENCOUNTER — Encounter (HOSPITAL_COMMUNITY): Payer: Self-pay | Admitting: Psychiatry

## 2012-09-02 VITALS — BP 138/72 | HR 76 | Ht 64.25 in | Wt 188.0 lb

## 2012-09-02 DIAGNOSIS — F319 Bipolar disorder, unspecified: Secondary | ICD-10-CM

## 2012-09-02 DIAGNOSIS — F5105 Insomnia due to other mental disorder: Secondary | ICD-10-CM | POA: Insufficient documentation

## 2012-09-02 DIAGNOSIS — F411 Generalized anxiety disorder: Secondary | ICD-10-CM

## 2012-09-02 DIAGNOSIS — F17200 Nicotine dependence, unspecified, uncomplicated: Secondary | ICD-10-CM

## 2012-09-02 MED ORDER — ARIPIPRAZOLE 5 MG PO TABS
5.0000 mg | ORAL_TABLET | Freq: Every day | ORAL | Status: DC
Start: 2012-09-02 — End: 2012-10-02

## 2012-09-02 MED ORDER — GABAPENTIN 100 MG PO CAPS: ORAL_CAPSULE | ORAL | Status: DC

## 2012-09-02 MED ORDER — TRAZODONE HCL 100 MG PO TABS: 100.0000 mg | ORAL_TABLET | Freq: Every day | ORAL | Status: DC

## 2012-09-02 MED ORDER — BENZTROPINE MESYLATE 0.5 MG PO TABS: 0.5000 mg | ORAL_TABLET | Freq: Every day | ORAL | Status: DC

## 2012-09-02 MED ORDER — CLONAZEPAM 0.5 MG PO TABS: ORAL_TABLET | ORAL | Status: DC

## 2012-09-02 MED ORDER — DULOXETINE HCL 20 MG PO CPEP: 40.0000 mg | ORAL_CAPSULE | Freq: Two times a day (BID) | ORAL | Status: DC

## 2012-09-02 MED ORDER — DULOXETINE HCL 60 MG PO CPEP: 60.0000 mg | ORAL_CAPSULE | Freq: Every day | ORAL | Status: DC
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
## 2012-09-02 NOTE — Addendum Note (Signed)
Addended by: Darrol Jump on: 09/02/2012 03:57 PM   Modules accepted: Orders

## 2012-09-02 NOTE — Patient Instructions (Signed)
Try Neurontin for anxiety and insomnia in place of the Klonopin.  It causes some to feel groggy or unsteady with the first couple of doses, so go slow.  You could even take 1/2 of the contents for the morning dose for the first day or two.. At night try one and could go to 3 at night for the insomnia.   Could try good quality fish oil twice a day for your memory.

## 2012-09-19 ENCOUNTER — Telehealth (HOSPITAL_COMMUNITY): Payer: Self-pay | Admitting: Psychiatry

## 2012-09-26 NOTE — Telephone Encounter (Signed)
Phone message completed in the phone message section.  

## 2012-10-02 ENCOUNTER — Encounter (HOSPITAL_COMMUNITY): Payer: Self-pay | Admitting: Psychiatry

## 2012-10-02 ENCOUNTER — Ambulatory Visit (INDEPENDENT_AMBULATORY_CARE_PROVIDER_SITE_OTHER): Payer: Medicare Other | Admitting: Psychiatry

## 2012-10-02 VITALS — Wt 186.4 lb

## 2012-10-02 DIAGNOSIS — F329 Major depressive disorder, single episode, unspecified: Secondary | ICD-10-CM

## 2012-10-02 DIAGNOSIS — F411 Generalized anxiety disorder: Secondary | ICD-10-CM

## 2012-10-02 DIAGNOSIS — F319 Bipolar disorder, unspecified: Secondary | ICD-10-CM

## 2012-10-02 DIAGNOSIS — F3289 Other specified depressive episodes: Secondary | ICD-10-CM

## 2012-10-02 DIAGNOSIS — F17200 Nicotine dependence, unspecified, uncomplicated: Secondary | ICD-10-CM

## 2012-10-02 DIAGNOSIS — F5105 Insomnia due to other mental disorder: Secondary | ICD-10-CM

## 2012-10-02 MED ORDER — BENZTROPINE MESYLATE 0.5 MG PO TABS
0.5000 mg | ORAL_TABLET | Freq: Every day | ORAL | Status: DC
Start: 2012-10-02 — End: 2012-12-11

## 2012-10-02 MED ORDER — ARIPIPRAZOLE 5 MG PO TABS: 5.0000 mg | ORAL_TABLET | Freq: Every day | ORAL | Status: DC

## 2012-10-02 MED ORDER — TRAZODONE HCL 100 MG PO TABS: 100.0000 mg | ORAL_TABLET | Freq: Every day | ORAL | Status: DC

## 2012-10-02 MED ORDER — DULOXETINE HCL 20 MG PO CPEP: 40.0000 mg | ORAL_CAPSULE | Freq: Two times a day (BID) | ORAL | Status: DC

## 2012-10-02 MED ORDER — CLONAZEPAM 0.5 MG PO TABS: ORAL_TABLET | ORAL | Status: DC

## 2012-10-02 NOTE — Patient Instructions (Addendum)
Inderal may be helpful for anxiety and tremor.  It is a blood pressure medication.  Please ask your doctor about considering switching your medications to that or include that for blood pressure control.  It will help with the anxiety you described in the office.  The Neurontin was an unsuccessful trial.    We have restarted the Klonopin.  Have a happy holiday.
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
## 2012-10-03 ENCOUNTER — Other Ambulatory Visit: Payer: Self-pay | Admitting: Neurology

## 2012-10-03 DIAGNOSIS — G252 Other specified forms of tremor: Secondary | ICD-10-CM

## 2012-10-03 DIAGNOSIS — G250 Essential tremor: Secondary | ICD-10-CM

## 2012-10-03 DIAGNOSIS — R269 Unspecified abnormalities of gait and mobility: Secondary | ICD-10-CM

## 2012-10-03 DIAGNOSIS — R413 Other amnesia: Secondary | ICD-10-CM

## 2012-10-11 ENCOUNTER — Other Ambulatory Visit: Payer: Self-pay

## 2012-10-15 ENCOUNTER — Other Ambulatory Visit (HOSPITAL_COMMUNITY): Payer: Self-pay | Admitting: Psychiatry

## 2012-10-16 ENCOUNTER — Other Ambulatory Visit (HOSPITAL_COMMUNITY): Payer: Self-pay | Admitting: Psychiatry

## 2012-10-22 ENCOUNTER — Other Ambulatory Visit: Payer: Self-pay

## 2012-12-03 ENCOUNTER — Ambulatory Visit (HOSPITAL_COMMUNITY): Payer: Self-pay | Admitting: Psychiatry

## 2012-12-11 ENCOUNTER — Other Ambulatory Visit (HOSPITAL_COMMUNITY): Payer: Self-pay | Admitting: Psychiatry

## 2012-12-26 ENCOUNTER — Emergency Department (HOSPITAL_COMMUNITY): Payer: Medicare Other

## 2012-12-26 ENCOUNTER — Encounter (HOSPITAL_COMMUNITY): Payer: Self-pay | Admitting: *Deleted

## 2012-12-26 ENCOUNTER — Inpatient Hospital Stay (HOSPITAL_COMMUNITY)
Admission: EM | Admit: 2012-12-26 | Discharge: 2013-01-08 | DRG: 193 | Disposition: A | Discharge status: Skilled Nursing Facility | Payer: Medicare Other | Attending: Family Medicine | Admitting: Family Medicine

## 2012-12-26 ENCOUNTER — Inpatient Hospital Stay (HOSPITAL_COMMUNITY): Payer: Medicare Other

## 2012-12-26 DIAGNOSIS — K589 Irritable bowel syndrome without diarrhea: Secondary | ICD-10-CM

## 2012-12-26 DIAGNOSIS — J45901 Unspecified asthma with (acute) exacerbation: Secondary | ICD-10-CM | POA: Diagnosis present

## 2012-12-26 DIAGNOSIS — T380X5A Adverse effect of glucocorticoids and synthetic analogues, initial encounter: Secondary | ICD-10-CM | POA: Diagnosis present

## 2012-12-26 DIAGNOSIS — Z881 Allergy status to other antibiotic agents status: Secondary | ICD-10-CM

## 2012-12-26 DIAGNOSIS — Z9119 Patient's noncompliance with other medical treatment and regimen: Secondary | ICD-10-CM

## 2012-12-26 DIAGNOSIS — R0600 Dyspnea, unspecified: Secondary | ICD-10-CM

## 2012-12-26 DIAGNOSIS — F329 Major depressive disorder, single episode, unspecified: Secondary | ICD-10-CM | POA: Diagnosis present

## 2012-12-26 DIAGNOSIS — L570 Actinic keratosis: Secondary | ICD-10-CM

## 2012-12-26 DIAGNOSIS — J4489 Other specified chronic obstructive pulmonary disease: Secondary | ICD-10-CM

## 2012-12-26 DIAGNOSIS — I2510 Atherosclerotic heart disease of native coronary artery without angina pectoris: Secondary | ICD-10-CM

## 2012-12-26 DIAGNOSIS — R197 Diarrhea, unspecified: Secondary | ICD-10-CM

## 2012-12-26 DIAGNOSIS — F5105 Insomnia due to other mental disorder: Secondary | ICD-10-CM

## 2012-12-26 DIAGNOSIS — I10 Essential (primary) hypertension: Secondary | ICD-10-CM

## 2012-12-26 DIAGNOSIS — R1319 Other dysphagia: Secondary | ICD-10-CM

## 2012-12-26 DIAGNOSIS — J309 Allergic rhinitis, unspecified: Secondary | ICD-10-CM

## 2012-12-26 DIAGNOSIS — K6289 Other specified diseases of anus and rectum: Secondary | ICD-10-CM

## 2012-12-26 DIAGNOSIS — E119 Type 2 diabetes mellitus without complications: Secondary | ICD-10-CM | POA: Diagnosis present

## 2012-12-26 DIAGNOSIS — Z8719 Personal history of other diseases of the digestive system: Secondary | ICD-10-CM

## 2012-12-26 DIAGNOSIS — F17200 Nicotine dependence, unspecified, uncomplicated: Secondary | ICD-10-CM

## 2012-12-26 DIAGNOSIS — Z860100 Personal history of colon polyps, unspecified: Secondary | ICD-10-CM

## 2012-12-26 DIAGNOSIS — G250 Essential tremor: Secondary | ICD-10-CM | POA: Diagnosis present

## 2012-12-26 DIAGNOSIS — IMO0002 Reserved for concepts with insufficient information to code with codable children: Secondary | ICD-10-CM

## 2012-12-26 DIAGNOSIS — F6089 Other specific personality disorders: Secondary | ICD-10-CM | POA: Diagnosis present

## 2012-12-26 DIAGNOSIS — E1142 Type 2 diabetes mellitus with diabetic polyneuropathy: Secondary | ICD-10-CM | POA: Diagnosis present

## 2012-12-26 DIAGNOSIS — E1149 Type 2 diabetes mellitus with other diabetic neurological complication: Secondary | ICD-10-CM | POA: Diagnosis present

## 2012-12-26 DIAGNOSIS — E782 Mixed hyperlipidemia: Secondary | ICD-10-CM | POA: Diagnosis present

## 2012-12-26 DIAGNOSIS — R079 Chest pain, unspecified: Secondary | ICD-10-CM

## 2012-12-26 DIAGNOSIS — I129 Hypertensive chronic kidney disease with stage 1 through stage 4 chronic kidney disease, or unspecified chronic kidney disease: Secondary | ICD-10-CM | POA: Diagnosis present

## 2012-12-26 DIAGNOSIS — F418 Other specified anxiety disorders: Secondary | ICD-10-CM

## 2012-12-26 DIAGNOSIS — J449 Chronic obstructive pulmonary disease, unspecified: Secondary | ICD-10-CM | POA: Diagnosis present

## 2012-12-26 DIAGNOSIS — E039 Hypothyroidism, unspecified: Secondary | ICD-10-CM

## 2012-12-26 DIAGNOSIS — Z888 Allergy status to other drugs, medicaments and biological substances status: Secondary | ICD-10-CM

## 2012-12-26 DIAGNOSIS — M549 Dorsalgia, unspecified: Secondary | ICD-10-CM

## 2012-12-26 DIAGNOSIS — Z91199 Patient's noncompliance with other medical treatment and regimen due to unspecified reason: Secondary | ICD-10-CM

## 2012-12-26 DIAGNOSIS — J9600 Acute respiratory failure, unspecified whether with hypoxia or hypercapnia: Secondary | ICD-10-CM | POA: Diagnosis present

## 2012-12-26 DIAGNOSIS — J441 Chronic obstructive pulmonary disease with (acute) exacerbation: Secondary | ICD-10-CM | POA: Diagnosis present

## 2012-12-26 DIAGNOSIS — G929 Unspecified toxic encephalopathy: Secondary | ICD-10-CM | POA: Diagnosis present

## 2012-12-26 DIAGNOSIS — F411 Generalized anxiety disorder: Secondary | ICD-10-CM | POA: Diagnosis present

## 2012-12-26 DIAGNOSIS — Z86718 Personal history of other venous thrombosis and embolism: Secondary | ICD-10-CM

## 2012-12-26 DIAGNOSIS — Z90710 Acquired absence of both cervix and uterus: Secondary | ICD-10-CM

## 2012-12-26 DIAGNOSIS — M545 Low back pain, unspecified: Secondary | ICD-10-CM

## 2012-12-26 DIAGNOSIS — N200 Calculus of kidney: Secondary | ICD-10-CM

## 2012-12-26 DIAGNOSIS — I252 Old myocardial infarction: Secondary | ICD-10-CM

## 2012-12-26 DIAGNOSIS — E785 Hyperlipidemia, unspecified: Secondary | ICD-10-CM

## 2012-12-26 DIAGNOSIS — G92 Toxic encephalopathy: Secondary | ICD-10-CM | POA: Diagnosis present

## 2012-12-26 DIAGNOSIS — F32A Depression, unspecified: Secondary | ICD-10-CM | POA: Diagnosis present

## 2012-12-26 DIAGNOSIS — G609 Hereditary and idiopathic neuropathy, unspecified: Secondary | ICD-10-CM

## 2012-12-26 DIAGNOSIS — H269 Unspecified cataract: Secondary | ICD-10-CM

## 2012-12-26 DIAGNOSIS — R404 Transient alteration of awareness: Secondary | ICD-10-CM | POA: Diagnosis present

## 2012-12-26 DIAGNOSIS — R809 Proteinuria, unspecified: Secondary | ICD-10-CM

## 2012-12-26 DIAGNOSIS — S68419A Complete traumatic amputation of unspecified hand at wrist level, initial encounter: Secondary | ICD-10-CM

## 2012-12-26 DIAGNOSIS — N189 Chronic kidney disease, unspecified: Secondary | ICD-10-CM | POA: Diagnosis present

## 2012-12-26 DIAGNOSIS — F3289 Other specified depressive episodes: Secondary | ICD-10-CM

## 2012-12-26 DIAGNOSIS — D509 Iron deficiency anemia, unspecified: Secondary | ICD-10-CM | POA: Diagnosis present

## 2012-12-26 DIAGNOSIS — Z794 Long term (current) use of insulin: Secondary | ICD-10-CM

## 2012-12-26 DIAGNOSIS — M51379 Other intervertebral disc degeneration, lumbosacral region without mention of lumbar back pain or lower extremity pain: Secondary | ICD-10-CM | POA: Diagnosis present

## 2012-12-26 DIAGNOSIS — J1100 Influenza due to unidentified influenza virus with unspecified type of pneumonia: Principal | ICD-10-CM | POA: Diagnosis present

## 2012-12-26 DIAGNOSIS — K219 Gastro-esophageal reflux disease without esophagitis: Secondary | ICD-10-CM

## 2012-12-26 DIAGNOSIS — J189 Pneumonia, unspecified organism: Secondary | ICD-10-CM

## 2012-12-26 DIAGNOSIS — T43505A Adverse effect of unspecified antipsychotics and neuroleptics, initial encounter: Secondary | ICD-10-CM | POA: Diagnosis present

## 2012-12-26 DIAGNOSIS — Z880 Allergy status to penicillin: Secondary | ICD-10-CM

## 2012-12-26 HISTORY — DX: Tremor, unspecified: R25.1

## 2012-12-26 HISTORY — DX: Sciatica, unspecified side: M54.30

## 2012-12-26 HISTORY — DX: Other intervertebral disc degeneration, lumbar region: M51.36

## 2012-12-26 HISTORY — DX: Other intervertebral disc degeneration, lumbar region without mention of lumbar back pain or lower extremity pain: M51.369

## 2012-12-26 LAB — BLOOD GAS, ARTERIAL
Drawn by: 22223
O2 Content: 6 L/min
pCO2 arterial: 42.4 mmHg (ref 35.0–45.0)
pO2, Arterial: 79.8 mmHg — ABNORMAL LOW (ref 80.0–100.0)

## 2012-12-26 LAB — BASIC METABOLIC PANEL
BUN: 26 mg/dL — ABNORMAL HIGH (ref 6–23)
CO2: 23 mEq/L (ref 19–32)
Glucose, Bld: 246 mg/dL — ABNORMAL HIGH (ref 70–99)
Potassium: 4.3 mEq/L (ref 3.5–5.1)
Sodium: 133 mEq/L — ABNORMAL LOW (ref 135–145)

## 2012-12-26 LAB — URINALYSIS, ROUTINE W REFLEX MICROSCOPIC
Glucose, UA: 100 mg/dL — AB
Ketones, ur: NEGATIVE mg/dL
Protein, ur: >300 mg/dL — AB

## 2012-12-26 LAB — CBC WITH DIFFERENTIAL/PLATELET
Basophils Relative: 1 % (ref 0–1)
Eosinophils Absolute: 0 10*3/uL (ref 0.0–0.7)
Eosinophils Relative: 0 % (ref 0–5)
Hemoglobin: 10.8 g/dL — ABNORMAL LOW (ref 12.0–15.0)
Lymphs Abs: 1.4 10*3/uL (ref 0.7–4.0)
MCH: 28 pg (ref 26.0–34.0)
MCHC: 32.6 g/dL (ref 30.0–36.0)
MCV: 85.8 fL (ref 78.0–100.0)
Monocytes Absolute: 1 10*3/uL (ref 0.1–1.0)
Monocytes Relative: 11 % (ref 3–12)
RBC: 3.86 MIL/uL — ABNORMAL LOW (ref 3.87–5.11)

## 2012-12-26 LAB — HEPATIC FUNCTION PANEL
ALT: 10 U/L (ref 0–35)
Alkaline Phosphatase: 155 U/L — ABNORMAL HIGH (ref 39–117)
Indirect Bilirubin: 0.3 mg/dL (ref 0.3–0.9)
Total Bilirubin: 0.6 mg/dL (ref 0.3–1.2)
Total Protein: 7.1 g/dL (ref 6.0–8.3)

## 2012-12-26 LAB — URINE MICROSCOPIC-ADD ON

## 2012-12-26 LAB — MAGNESIUM: Magnesium: 1.4 mg/dL — ABNORMAL LOW (ref 1.5–2.5)

## 2012-12-26 LAB — TROPONIN I: Troponin I: <0.3 ng/mL (ref <0.30)

## 2012-12-26 MED ORDER — METFORMIN HCL 500 MG PO TABS: 500.0000 mg | ORAL_TABLET | Freq: Two times a day (BID) | ORAL | Status: DC

## 2012-12-26 MED ORDER — INSULIN GLARGINE 100 UNIT/ML ~~LOC~~ SOLN
35.0000 [IU] | Freq: Every day | SUBCUTANEOUS | Status: DC
  Administered 2012-12-26 – 2012-12-28 (×3): 35 [IU] via SUBCUTANEOUS

## 2012-12-26 MED ORDER — LISINOPRIL 10 MG PO TABS
20.0000 mg | ORAL_TABLET | Freq: Every day | ORAL | Status: DC
  Filled 2012-12-26: qty 2

## 2012-12-26 MED ORDER — OMEGA-3-ACID ETHYL ESTERS 1 G PO CAPS
2.0000 g | ORAL_CAPSULE | Freq: Every day | ORAL | Status: DC
  Administered 2012-12-27 – 2013-01-08 (×11): 2 g via ORAL
  Filled 2012-12-26 (×3): qty 2
  Filled 2012-12-26: qty 1
  Filled 2012-12-26: qty 2
  Filled 2012-12-26: qty 1
  Filled 2012-12-26 (×9): qty 2

## 2012-12-26 MED ORDER — NIACIN ER (ANTIHYPERLIPIDEMIC) 500 MG PO TBCR
1000.0000 mg | EXTENDED_RELEASE_TABLET | Freq: Every day | ORAL | Status: DC
  Administered 2012-12-27 – 2013-01-07 (×10): 1000 mg via ORAL
  Filled 2012-12-26 (×14): qty 2

## 2012-12-26 MED ORDER — ALBUTEROL (5 MG/ML) CONTINUOUS INHALATION SOLN
15.0000 mg | INHALATION_SOLUTION | Freq: Once | RESPIRATORY_TRACT | Status: AC
  Administered 2012-12-26: 15 mg via RESPIRATORY_TRACT

## 2012-12-26 MED ORDER — CEFTRIAXONE SODIUM 1 G IJ SOLR
1.0000 g | INTRAMUSCULAR | Status: DC
  Administered 2012-12-27 – 2012-12-31 (×5): 1 g via INTRAVENOUS
  Filled 2012-12-26 (×6): qty 10

## 2012-12-26 MED ORDER — AZITHROMYCIN 250 MG PO TABS: 500.0000 mg | ORAL_TABLET | Freq: Once | ORAL | Status: DC

## 2012-12-26 MED ORDER — ALBUTEROL SULFATE (5 MG/ML) 0.5% IN NEBU
INHALATION_SOLUTION | RESPIRATORY_TRACT | Status: AC
  Administered 2012-12-26: 2.5 mg via RESPIRATORY_TRACT
  Filled 2012-12-26: qty 0.5

## 2012-12-26 MED ORDER — IPRATROPIUM-ALBUTEROL 0.5-2.5 (3) MG/3ML IN SOLN: 3.0000 mL | RESPIRATORY_TRACT | Status: DC

## 2012-12-26 MED ORDER — ARIPIPRAZOLE 10 MG PO TABS
ORAL_TABLET | ORAL | Status: AC
  Filled 2012-12-26: qty 1

## 2012-12-26 MED ORDER — ARIPIPRAZOLE 10 MG PO TABS
5.0000 mg | ORAL_TABLET | Freq: Every day | ORAL | Status: DC
  Administered 2012-12-26 – 2013-01-07 (×11): 5 mg via ORAL
  Filled 2012-12-26 (×2): qty 1
  Filled 2012-12-26: qty 2
  Filled 2012-12-26 (×10): qty 1

## 2012-12-26 MED ORDER — CEFTRIAXONE SODIUM 1 G IJ SOLR
1.0000 g | INTRAMUSCULAR | Status: DC
  Filled 2012-12-26 (×3): qty 10

## 2012-12-26 MED ORDER — IPRATROPIUM BROMIDE 0.02 % IN SOLN
1.0000 mg | Freq: Once | RESPIRATORY_TRACT | Status: AC
  Administered 2012-12-26: 1 mg via RESPIRATORY_TRACT
  Filled 2012-12-26: qty 5

## 2012-12-26 MED ORDER — INSULIN ASPART 100 UNIT/ML ~~LOC~~ SOLN
12.0000 [IU] | Freq: Three times a day (TID) | SUBCUTANEOUS | Status: DC
  Administered 2012-12-28 – 2013-01-03 (×2): 12 [IU] via SUBCUTANEOUS

## 2012-12-26 MED ORDER — ALBUTEROL SULFATE (5 MG/ML) 0.5% IN NEBU
INHALATION_SOLUTION | RESPIRATORY_TRACT | Status: AC
  Administered 2012-12-26: 15 mg via RESPIRATORY_TRACT
  Filled 2012-12-26: qty 3

## 2012-12-26 MED ORDER — BENZTROPINE MESYLATE 1 MG PO TABS
0.5000 mg | ORAL_TABLET | Freq: Every day | ORAL | Status: DC
  Administered 2012-12-26 – 2013-01-08 (×13): 0.5 mg via ORAL
  Filled 2012-12-26 (×11): qty 1
  Filled 2012-12-26: qty 2

## 2012-12-26 MED ORDER — DEXTROSE 5 % IV SOLN
500.0000 mg | INTRAVENOUS | Status: DC
  Administered 2012-12-26 – 2012-12-28 (×3): 500 mg via INTRAVENOUS
  Filled 2012-12-26 (×4): qty 500

## 2012-12-26 MED ORDER — DEXTROSE 5 % IV SOLN
1.0000 g | Freq: Once | INTRAVENOUS | Status: AC
  Administered 2012-12-26: 1 g via INTRAVENOUS
  Filled 2012-12-26: qty 10

## 2012-12-26 MED ORDER — PANTOPRAZOLE SODIUM 40 MG PO TBEC
80.0000 mg | DELAYED_RELEASE_TABLET | Freq: Every day | ORAL | Status: DC
  Administered 2012-12-26 – 2013-01-08 (×13): 80 mg via ORAL
  Filled 2012-12-26 (×9): qty 2
  Filled 2012-12-26: qty 1
  Filled 2012-12-26 (×2): qty 2

## 2012-12-26 MED ORDER — CLONAZEPAM 0.5 MG PO TABS
0.5000 mg | ORAL_TABLET | Freq: Two times a day (BID) | ORAL | Status: DC | PRN
  Administered 2012-12-26 – 2012-12-31 (×7): 0.5 mg via ORAL
  Filled 2012-12-26 (×8): qty 1

## 2012-12-26 MED ORDER — LORAZEPAM 1 MG PO TABS
0.5000 mg | ORAL_TABLET | Freq: Once | ORAL | Status: AC
  Administered 2012-12-26: 0.5 mg via ORAL
  Filled 2012-12-26: qty 1

## 2012-12-26 MED ORDER — BENZTROPINE MESYLATE 1 MG PO TABS
0.5000 mg | ORAL_TABLET | Freq: Every day | ORAL | Status: DC
  Filled 2012-12-26: qty 1

## 2012-12-26 MED ORDER — TRAZODONE HCL 50 MG PO TABS
100.0000 mg | ORAL_TABLET | Freq: Every day | ORAL | Status: DC
  Administered 2012-12-27 – 2013-01-07 (×10): 100 mg via ORAL
  Filled 2012-12-26 (×10): qty 2

## 2012-12-26 MED ORDER — ENOXAPARIN SODIUM 80 MG/0.8ML ~~LOC~~ SOLN
80.0000 mg | Freq: Two times a day (BID) | SUBCUTANEOUS | Status: DC
  Administered 2012-12-26 – 2012-12-27 (×2): 80 mg via SUBCUTANEOUS
  Filled 2012-12-26 (×2): qty 0.8

## 2012-12-26 MED ORDER — SODIUM CHLORIDE 0.9 % IV SOLN
INTRAVENOUS | Status: DC
  Administered 2012-12-26 – 2012-12-30 (×8): via INTRAVENOUS
  Administered 2012-12-31: 1000 mL via INTRAVENOUS
  Administered 2013-01-01 – 2013-01-04 (×4): via INTRAVENOUS
  Administered 2013-01-05: 75 mL/h via INTRAVENOUS
  Administered 2013-01-05 – 2013-01-06 (×2): via INTRAVENOUS

## 2012-12-26 MED ORDER — METOPROLOL TARTRATE 25 MG PO TABS
25.0000 mg | ORAL_TABLET | Freq: Two times a day (BID) | ORAL | Status: DC
  Administered 2012-12-26 – 2013-01-08 (×23): 25 mg via ORAL
  Filled 2012-12-26 (×25): qty 1

## 2012-12-26 MED ORDER — LISINOPRIL 10 MG PO TABS
20.0000 mg | ORAL_TABLET | Freq: Every day | ORAL | Status: DC
  Administered 2012-12-26 – 2013-01-08 (×13): 20 mg via ORAL
  Filled 2012-12-26 (×14): qty 2

## 2012-12-26 MED ORDER — DULOXETINE HCL 20 MG PO CPEP
40.0000 mg | ORAL_CAPSULE | Freq: Two times a day (BID) | ORAL | Status: DC
  Administered 2012-12-27 – 2013-01-08 (×22): 40 mg via ORAL
  Filled 2012-12-26 (×28): qty 2

## 2012-12-26 MED ORDER — GLYBURIDE 5 MG PO TABS
5.0000 mg | ORAL_TABLET | Freq: Two times a day (BID) | ORAL | Status: DC
  Administered 2012-12-26 – 2012-12-31 (×5): 5 mg via ORAL
  Filled 2012-12-26 (×10): qty 1

## 2012-12-26 MED ORDER — ALBUTEROL SULFATE (5 MG/ML) 0.5% IN NEBU
2.5000 mg | INHALATION_SOLUTION | RESPIRATORY_TRACT | Status: DC
  Administered 2012-12-26 – 2012-12-27 (×4): 2.5 mg via RESPIRATORY_TRACT
  Filled 2012-12-26 (×4): qty 0.5

## 2012-12-26 MED ORDER — COLESEVELAM HCL 625 MG PO TABS
1875.0000 mg | ORAL_TABLET | Freq: Two times a day (BID) | ORAL | Status: DC
  Administered 2012-12-27 – 2013-01-08 (×17): 1875 mg via ORAL
  Filled 2012-12-26 (×28): qty 3

## 2012-12-26 MED ORDER — SIMVASTATIN 20 MG PO TABS
20.0000 mg | ORAL_TABLET | Freq: Every day | ORAL | Status: DC
  Administered 2012-12-26 – 2013-01-07 (×8): 20 mg via ORAL
  Filled 2012-12-26 (×10): qty 1

## 2012-12-26 MED ORDER — ASPIRIN 81 MG PO CHEW
81.0000 mg | CHEWABLE_TABLET | Freq: Every day | ORAL | Status: DC
  Administered 2012-12-27 – 2013-01-08 (×12): 81 mg via ORAL
  Filled 2012-12-26 (×12): qty 1

## 2012-12-26 MED ORDER — IPRATROPIUM BROMIDE 0.02 % IN SOLN
RESPIRATORY_TRACT | Status: AC
  Administered 2012-12-26: 0.5 mg
  Filled 2012-12-26: qty 2.5

## 2012-12-26 MED ORDER — NITROGLYCERIN 0.4 MG SL SUBL: 0.4000 mg | SUBLINGUAL_TABLET | SUBLINGUAL | Status: DC | PRN

## 2012-12-26 MED ORDER — IPRATROPIUM BROMIDE 0.02 % IN SOLN
0.5000 mg | RESPIRATORY_TRACT | Status: DC
  Administered 2012-12-26 – 2012-12-27 (×4): 0.5 mg via RESPIRATORY_TRACT
  Filled 2012-12-26 (×4): qty 2.5

## 2012-12-26 MED ORDER — ASPIRIN 81 MG PO CHEW: 81.0000 mg | CHEWABLE_TABLET | Freq: Every day | ORAL | Status: DC

## 2012-12-26 MED ORDER — METFORMIN HCL 500 MG PO TABS
500.0000 mg | ORAL_TABLET | Freq: Two times a day (BID) | ORAL | Status: DC
  Filled 2012-12-26: qty 1

## 2012-12-26 MED ORDER — METHYLPREDNISOLONE SODIUM SUCC 125 MG IJ SOLR
125.0000 mg | Freq: Once | INTRAMUSCULAR | Status: AC
  Administered 2012-12-26: 125 mg via INTRAVENOUS
  Filled 2012-12-26: qty 2

## 2012-12-26 MED ORDER — PANTOPRAZOLE SODIUM 40 MG PO TBEC
40.0000 mg | DELAYED_RELEASE_TABLET | Freq: Every day | ORAL | Status: DC
  Filled 2012-12-26: qty 1

## 2012-12-26 MED ORDER — NIACIN 500 MG PO TABS
ORAL_TABLET | ORAL | Status: AC
  Filled 2012-12-26: qty 2

## 2012-12-26 MED ORDER — LEVOTHYROXINE SODIUM 100 MCG PO TABS
400.0000 ug | ORAL_TABLET | Freq: Every day | ORAL | Status: DC
  Filled 2012-12-26 (×3): qty 2

## 2012-12-26 NOTE — ED Notes (Signed)
Report given to Janett Billow, RN unit 300. Ready to receive patient in 15 minutes. Bed assignment changed to bed closer to nurse's station due to high fall risk.

## 2012-12-26 NOTE — Progress Notes (Signed)
RT attempted to place patient on BIPAP 10/5 with 40% FIO2; however, patient refused. RT explained to patient the importance of usage; patient still refused. Patient very agitated with difficulty breathing RR 40, SATs 0n 6L 88-89%, HR 114. RT gave patient breathing treatment with 2.75m albuterol and 0.593matrovent, patient tolerated well. RN and MD made aware of patient nocompliance

## 2012-12-26 NOTE — Progress Notes (Signed)
Patient BIPAP on standby until needed at this time. Patient unable to tolerate BIPAP and continues to be monitored.

## 2012-12-26 NOTE — ED Notes (Signed)
RT stating patient needs Bipap. Dr Cindie Laroche Made aware and verbal order for bipap obtained. Bed status changed to Stepdown.

## 2012-12-26 NOTE — ED Notes (Signed)
RT stating that patient is refusing to wear the bipap. Dr Cindie Laroche paged.

## 2012-12-26 NOTE — ED Notes (Signed)
Pt states right lower back pain x 2 weeks. States she has noticed blood in her urine. O2 sat of 73% RA with HR of 117 in triage. Pt states she does not wear O2.

## 2012-12-26 NOTE — H&P (Signed)
[id number] 

## 2012-12-26 NOTE — ED Provider Notes (Signed)
History     CSN: [id number]  Arrival date & time 12/26/12  1138   First MD Initiated Contact with Patient 12/26/12 1208      Chief Complaint  Patient presents with  . Flank Pain    HPI Pt was seen at 1200.   Per pt, c/o gradual onset and worsening of persistent cough, wheezing and SOB for the past 4 days.  Describes his symptoms as "my COPD is acting up."  Has been using home MDI and nebs without relief.  Pt states she was eval by her PMD 4 days ago, and again today for same, and was sent to the ED for further eval.  Per pt, c/o gradual onset and persistence of constant acute flair of her chronic right sided low back "pain" for the past several days.  Denies any change in her usual chronic pain pattern.  Pain worsens with palpation of the area and body position changes. Denies incont/retention of bowel or bladder, no saddle anesthesia, no focal motor weakness, no tingling/numbness in extremities, no fevers, no injury.  Denies CP/palpitations, no flank pain, no abd pain, no N/V/D, no fevers, no rash.      Past Medical History  Diagnosis Date  . Diabetes mellitus   . COPD (chronic obstructive pulmonary disease)   . ASCVD (arteriosclerotic cardiovascular disease)     MI in 96 requiring BMS CX; DES to M1 in 2000;normal coronary angiography in 2004  . Hyperlipidemia   . Hypothyroidism   . Tobacco abuse   . Depression   . DVT (deep venous thrombosis)   . Nephrolithiasis 2006    stone extraction   . Amputation of hand, right     traumatic  . Allergic rhinitis   . Cholelithiasis   . GERD (gastroesophageal reflux disease)   . Low back pain   . Peripheral neuropathy   . Diabetes mellitus type I   . DDD (degenerative disc disease), lumbar   . Sciatic pain     right  . Tremor     Past Surgical History  Procedure Laterality Date  . Dilation and curettage of uterus  1974  . Partial hysterectomy  1978  . Total abdominal hysterectomy w/ bilateral salpingoophorectomy  2002  .  Shoulder surgery      Left shoulder for RTC;left arm surgery '98/left hand surgery 2001  . Cholecystectomy    . Umbilical hernia repair  2008  . Colonoscopy  01/2009    nl repeat in 5  years  . Back surgery    . Hand amputation Right     traumatic    Family History  Problem Relation Age of Onset  . Depression Mother   . Bipolar disorder Mother   . Dementia Mother   . Alcohol abuse Father     History  Substance Use Topics  . Smoking status: Current Every Day Smoker -- 0.50 packs/day for 40 years    Types: Cigarettes  . Smokeless tobacco: Not on file  . Alcohol Use: No    Review of Systems ROS: Statement: All systems negative except as marked or noted in the HPI; Constitutional: Negative for fever and chills. ; ; Eyes: Negative for eye pain, redness and discharge. ; ; ENMT: Negative for ear pain, hoarseness, nasal congestion, sinus pressure and sore throat. ; ; Cardiovascular: Negative for chest pain, palpitations, diaphoresis, and peripheral edema. ; ; Respiratory: +SOB, cough, wheezing. Negative for stridor. ; ; Gastrointestinal: Negative for nausea, vomiting, diarrhea, abdominal pain, blood in stool,  hematemesis, jaundice and rectal bleeding. . ; ; Genitourinary: Negative for dysuria, flank pain and hematuria. ; ; Musculoskeletal: +LBP. Negative for neck pain. Negative for swelling and trauma.; ; Skin: Negative for pruritus, rash, abrasions, blisters, bruising and skin lesion.; ; Neuro: Negative for headache, lightheadedness and neck stiffness. Negative for weakness, altered level of consciousness , altered mental status, extremity weakness, paresthesias, involuntary movement, seizure and syncope.       Allergies  Ciprofloxacin; Iohexol; Neomycin-bacitracin zn-polymyx; Penicillins; and Povidone  Home Medications   Current Outpatient Rx  Name  Route  Sig  Dispense  Refill  . ARIPiprazole (ABILIFY) 5 MG tablet   Oral   Take 1 tablet (5 mg total) by mouth daily.   30 tablet    1   . aspirin 81 MG chewable tablet   Oral   Chew 81 mg by mouth daily.         . beclomethasone (QVAR) 40 MCG/ACT inhaler   Inhalation   Inhale 2 puffs into the lungs 2 (two) times daily.           . benztropine (COGENTIN) 0.5 MG tablet      TAKE 1 TABLET (0.5 MG TOTAL)  BY MOUTH DAILY.   30 tablet   0   . clindamycin (CLEOCIN) 300 MG capsule   Oral   Take 300 mg by mouth 3 (three) times daily.          . clonazePAM (KLONOPIN) 0.5 MG tablet   Oral   Take 0.5-1 mg by mouth 2 (two) times daily. Take 1 in am and 2 at bed time         . colesevelam (WELCHOL) 625 MG tablet   Oral   Take 1,875 mg by mouth 2 (two) times daily with a meal.           . DULoxetine (CYMBALTA) 20 MG capsule   Oral   Take 2 capsules (40 mg total) by mouth 2 (two) times daily.   120 capsule   1   . esomeprazole (NEXIUM) 40 MG capsule   Oral   Take 40 mg by mouth daily before breakfast.           . fish oil-omega-3 fatty acids 1000 MG capsule   Oral   Take 2 g by mouth daily.           Marland Kitchen gabapentin (NEURONTIN) 100 MG capsule   Oral   Take 1 capsule by mouth 3 (three) times daily.         Marland Kitchen glyBURIDE (DIABETA) 5 MG tablet   Oral   Take 5 mg by mouth 2 (two) times daily.          Marland Kitchen levothyroxine (SYNTHROID, LEVOTHROID) 200 MCG tablet   Oral   Take 400 mcg by mouth daily.         Marland Kitchen lisinopril (PRINIVIL,ZESTRIL) 20 MG tablet   Oral   Take 20 mg by mouth daily.           . metFORMIN (GLUCOPHAGE) 500 MG tablet   Oral   Take 500 mg by mouth 2 (two) times daily.         . metoprolol (LOPRESSOR) 50 MG tablet   Oral   Take 25 mg by mouth 2 (two) times daily.          . naproxen (NAPROSYN) 500 MG tablet   Oral   Take 500 mg by mouth 2 (two) times daily.          Marland Kitchen  niacin (NIASPAN) 1000 MG CR tablet   Oral   Take 1,000 mg by mouth at bedtime.           . nitroGLYCERIN (NITROSTAT) 0.4 MG SL tablet   Sublingual   Place 0.4 mg under the tongue every 5  (five) minutes as needed. Chest pain         . pravastatin (PRAVACHOL) 40 MG tablet   Oral   Take 40 mg by mouth daily.           . traZODone (DESYREL) 100 MG tablet   Oral   Take 1 tablet (100 mg total) by mouth at bedtime.   30 tablet   1     BP 130/95  Pulse 110  Temp(Src) 98.2 F (36.8 C) (Oral)  Resp 34  Ht 5' 6.5" (1.689 m)  Wt 186 lb (84.369 kg)  BMI 29.57 kg/m2  SpO2 90%  Physical Exam 1205: Physical examination:  Nursing notes reviewed; Vital signs and O2 SAT reviewed;  Constitutional: Well developed, Well nourished, Uncomfortable appearing; Head:  Normocephalic, atraumatic; Eyes: EOMI, PERRL, No scleral icterus; ENMT: Mouth and pharynx normal, Mucous membranes moist; Neck: Supple, Full range of motion, No lymphadenopathy; Cardiovascular: Tachycardic rate and rhythm, No gallop; Respiratory: Breath sounds diminished & equal bilaterally, insp/exp wheezes bilat with audible wheezing. Sitting upright, tachypneic with access mm use. Speaking in words.; Chest: Nontender, Movement normal; Abdomen: Soft, Nontender, Nondistended, Normal bowel sounds; Genitourinary: No CVA tenderness; Spine:  No midline CS, TS, LS tenderness.  +TTP right lumbar paraspinal muscles;; Extremities: Pulses normal, No tenderness, No edema, No calf edema or asymmetry. +tremor per hx.; Neuro: AA&Ox3, Major CN grossly intact.  Speech clear. No gross focal motor or sensory deficits in extremities.; Skin: Color normal, Warm, Dry.   ED Course  Procedures   1205:  Pt with resp distress on arrival.  Sats in 70's on R/A.  IV solumedrol and continuous neb started. Pt has hx of chronic LBP, as well as kidney stones.  States LBP consistent "with one of them."  Previous MRI, CT, and [place] reviewed:  No hx of AAA; doubt same.  Pt states she "hasn't smoked in a year" but her husband states she has been smoking regularly, most recently as of 4 days ago before her symptoms began.   1300:  Continuous neb completed.  Sats  low 90's on O2 3L N/C, lungs continue coarse with insp/exp wheezing, no audible wheezing.  VS otherwise stable. Will tx for CAP.  CT A/P without acute findings; will tx back pain symptomatically at this time.    1330:  T/C to Dr. Cindie Laroche, case discussed, including:  HPI, pertinent PM/SHx, VS/PE, dx testing, ED course and treatment:  States he knows pt well, agreeable to admit, requests he will come to ED for eval.   1500:  Dr Cindie Laroche has eval in ED.  Pt's Sats now dropping into 80's while on N/C O2, will start bipap.VS otherwise stable.  Pt states she "needs something for my nerves" before starting bipap.  Will dose ativan PO then start Bipap.  Pt upgraded to stepdown bed by Dr. Cindie Laroche.  MDM  MDM Reviewed: previous chart, nursing note and vitals Reviewed previous: labs, ECG, MRI and ultrasound Interpretation: labs, ECG, x-ray and CT scan Total time providing critical care: 30-74 minutes. This excludes time spent performing separately reportable procedures and services. Consults: admitting MD     CRITICAL CARE Performed by: Alfonzo Feller Total critical care time: 45 Critical care time was  exclusive of separately billable procedures and treating other patients. Critical care was necessary to treat or prevent imminent or life-threatening deterioration. Critical care was time spent personally by me on the following activities: development of treatment plan with patient and/or surrogate as well as nursing, discussions with consultants, evaluation of patient's response to treatment, examination of patient, obtaining history from patient or surrogate, ordering and performing treatments and interventions, ordering and review of laboratory studies, ordering and review of radiographic studies, pulse oximetry and re-evaluation of patient's condition.    Date: 12/26/2012  Rate: 127  Rhythm: sinus tachycardia, artifact  QRS Axis: left  Intervals: normal  ST/T Wave abnormalities: nonspecific  ST/T changes  Conduction Disutrbances:none  Narrative Interpretation:   Old EKG Reviewed: unchanged; no significant changes from previous EKG dated 08/22/2010.  Results for orders placed during the hospital encounter of 70/48/88  BASIC METABOLIC PANEL      Result Value Range   Sodium 133 (*) 135 - 145 mEq/L   Potassium 4.3  3.5 - 5.1 mEq/L   Chloride 97  96 - 112 mEq/L   CO2 23  19 - 32 mEq/L   Glucose, Bld 246 (*) 70 - 99 mg/dL   BUN 26 (*) 6 - 23 mg/dL   Creatinine, Ser 1.45 (*) 0.50 - 1.10 mg/dL   Calcium 9.1  8.4 - 10.5 mg/dL   GFR calc non Af Amer 38 (*) >90 mL/min   GFR calc Af Amer 44 (*) >90 mL/min  CBC WITH DIFFERENTIAL      Result Value Range   WBC 9.6  4.0 - 10.5 K/uL   RBC 3.86 (*) 3.87 - 5.11 MIL/uL   Hemoglobin 10.8 (*) 12.0 - 15.0 g/dL   HCT 33.1 (*) 36.0 - 46.0 %   MCV 85.8  78.0 - 100.0 fL   MCH 28.0  26.0 - 34.0 pg   MCHC 32.6  30.0 - 36.0 g/dL   RDW 15.8 (*) 11.5 - 15.5 %   Platelets 213  150 - 400 K/uL   Neutrophils Relative 75  43 - 77 %   Neutro Abs 7.2  1.7 - 7.7 K/uL   Lymphocytes Relative 14  12 - 46 %   Lymphs Abs 1.4  0.7 - 4.0 K/uL   Monocytes Relative 11  3 - 12 %   Monocytes Absolute 1.0  0.1 - 1.0 K/uL   Eosinophils Relative 0  0 - 5 %   Eosinophils Absolute 0.0  0.0 - 0.7 K/uL   Basophils Relative 1  0 - 1 %   Basophils Absolute 0.1  0.0 - 0.1 K/uL  TROPONIN I      Result Value Range   Troponin I <0.30  <0.30 ng/mL   Dg Chest Port 1 View 12/26/2012  *RADIOLOGY REPORT*  Clinical Data: Shortness of breath.  PORTABLE CHEST - 1 VIEW  Comparison: Single view of the chest 07/06/2012 and PA and lateral chest 08/19/2010.  Findings: There is extensive patchy bilateral airspace disease. Heart size is upper normal.  No pneumothorax or pleural effusion.  IMPRESSION: Extensive bilateral airspace disease is most likely due to multi focal pneumonia rather than asymmetric edema.   Original Report Authenticated By: Orlean Patten, M.D.    Ct Abdomen Pelvis  Wo Contrast 12/26/2012  *RADIOLOGY REPORT*  Clinical Data: 62 year old female with right abdominal, flank and pelvic pain.  CT ABDOMEN AND PELVIS WITHOUT CONTRAST  Technique:  Multidetector CT imaging of the abdomen and pelvis was performed following the standard protocol without  intravenous contrast.  Comparison: 07/09/2007 CT.  Findings: Scattered areas of focal consolidation and airspace disease within both lung bases are identified - likely representing pneumonia.  Follow-up is recommended.  Hepatomegaly is again identified without evidence of focal hepatic abnormality. The spleen, adrenal glands, and pancreas are unremarkable. Multiple nonobstructing bilateral renal calculi are identified. There is no evidence of hydronephrosis or ureteral calculi.  Please note that parenchymal abnormalities may be missed as intravenous contrast was not administered.  A small amount of ascites is identified adjacent to the liver and within the pelvis.  No enlarged lymph nodes, biliary dilation or abdominal aortic aneurysm identified. Moderate to heavy aortic and iliac atherosclerotic calcifications again noted. The patient is status post cholecystectomy and hysterectomy.  The bowel and bladder are within normal limits.  Mild subcutaneous stranding/edema is noted. No acute or suspicious bony abnormalities are identified.  IMPRESSION: Consolidation and airspace disease within both lower lungs likely representing pneumonia.  Radiographic follow up to resolution recommended.  Tiny amount of abdominal pelvic ascites - nonspecific.  Mild subcutaneous edema.  Hepatomegaly and nonobstructing bilateral renal calculi.   Original Report Authenticated By: Margarette Canada, M.D.     Results for LACONYA, CLERE (MRN [id number]) as of 12/26/2012 14:46  Ref. Range 08/19/2010 23:10 05/03/2011 14:29 05/04/2011 06:20 05/03/2012 09:42 12/26/2012 12:06  BUN Latest Range: 6-23 mg/dL 11 8 8 8 26  (H)  Creatinine Latest Range: 0.50-1.10 mg/dL 0.93 0.91 0.93  0.85 1.45 (H)    Results for WYLENE, WEISSMAN (MRN [id number]) as of 12/26/2012 14:46  Ref. Range 08/19/2010 23:10 05/03/2011 14:29 05/04/2011 06:20 05/03/2012 09:42 12/26/2012 12:06  Hemoglobin Latest Range: 12.0-15.0 g/dL 11.6 (L) 12.1 11.9 (L) 11.0 (L) 10.8 (L)  HCT Latest Range: 36.0-46.0 % 35.0 (L) 37.3 35.8 (L) 34.8 (L) 33.1 (L)       Alfonzo Feller, DO 12/28/12 1232

## 2012-12-27 ENCOUNTER — Inpatient Hospital Stay (HOSPITAL_COMMUNITY): Payer: Medicare Other

## 2012-12-27 LAB — BASIC METABOLIC PANEL
BUN: 39 mg/dL — ABNORMAL HIGH (ref 6–23)
Chloride: 105 mEq/L (ref 96–112)
GFR calc Af Amer: 40 mL/min — ABNORMAL LOW (ref >90)
Potassium: 4.1 mEq/L (ref 3.5–5.1)

## 2012-12-27 LAB — GLUCOSE, CAPILLARY
Glucose-Capillary: 88 mg/dL (ref 70–99)
Glucose-Capillary: 91 mg/dL (ref 70–99)

## 2012-12-27 LAB — HEPATIC FUNCTION PANEL
ALT: 8 U/L (ref 0–35)
AST: 13 U/L (ref 0–37)
Bilirubin, Direct: 0.2 mg/dL (ref 0.0–0.3)
Total Protein: 6 g/dL (ref 6.0–8.3)

## 2012-12-27 LAB — INFLUENZA PANEL BY PCR (TYPE A & B): Influenza B By PCR: NEGATIVE

## 2012-12-27 MED ORDER — ALBUTEROL SULFATE (5 MG/ML) 0.5% IN NEBU: 2.5000 mg | INHALATION_SOLUTION | Freq: Four times a day (QID) | RESPIRATORY_TRACT | Status: DC | PRN

## 2012-12-27 MED ORDER — ALBUTEROL SULFATE (5 MG/ML) 0.5% IN NEBU: 2.5000 mg | INHALATION_SOLUTION | Freq: Four times a day (QID) | RESPIRATORY_TRACT | Status: DC

## 2012-12-27 MED ORDER — VANCOMYCIN HCL IN DEXTROSE 1-5 GM/200ML-% IV SOLN
1000.0000 mg | INTRAVENOUS | Status: DC
  Administered 2012-12-27 – 2012-12-29 (×3): 1000 mg via INTRAVENOUS
  Filled 2012-12-27 (×5): qty 200

## 2012-12-27 MED ORDER — METHYLPREDNISOLONE SODIUM SUCC 125 MG IJ SOLR
125.0000 mg | Freq: Three times a day (TID) | INTRAMUSCULAR | Status: DC
  Administered 2012-12-27 – 2013-01-05 (×27): 125 mg via INTRAVENOUS
  Filled 2012-12-27 (×27): qty 2

## 2012-12-27 MED ORDER — IPRATROPIUM BROMIDE 0.02 % IN SOLN: 0.5000 mg | RESPIRATORY_TRACT | Status: DC

## 2012-12-27 MED ORDER — ALBUTEROL SULFATE (5 MG/ML) 0.5% IN NEBU
2.5000 mg | INHALATION_SOLUTION | RESPIRATORY_TRACT | Status: DC | PRN
  Administered 2012-12-29: 2.5 mg via RESPIRATORY_TRACT
  Filled 2012-12-27: qty 0.5

## 2012-12-27 MED ORDER — ALBUTEROL SULFATE (5 MG/ML) 0.5% IN NEBU
2.5000 mg | INHALATION_SOLUTION | Freq: Four times a day (QID) | RESPIRATORY_TRACT | Status: DC
  Administered 2012-12-27 – 2012-12-28 (×5): 2.5 mg via RESPIRATORY_TRACT
  Filled 2012-12-27 (×5): qty 0.5

## 2012-12-27 MED ORDER — OSELTAMIVIR PHOSPHATE 75 MG PO CAPS
75.0000 mg | ORAL_CAPSULE | Freq: Two times a day (BID) | ORAL | Status: AC
  Administered 2012-12-27 – 2012-12-31 (×9): 75 mg via ORAL
  Filled 2012-12-27 (×9): qty 1

## 2012-12-27 MED ORDER — ENOXAPARIN SODIUM 40 MG/0.4ML ~~LOC~~ SOLN
40.0000 mg | Freq: Every day | SUBCUTANEOUS | Status: DC
  Administered 2012-12-28 – 2013-01-08 (×12): 40 mg via SUBCUTANEOUS
  Filled 2012-12-27 (×12): qty 0.4

## 2012-12-27 MED ORDER — IPRATROPIUM BROMIDE 0.02 % IN SOLN
0.5000 mg | Freq: Four times a day (QID) | RESPIRATORY_TRACT | Status: DC
  Administered 2012-12-27 – 2012-12-28 (×5): 0.5 mg via RESPIRATORY_TRACT
  Filled 2012-12-27 (×5): qty 2.5

## 2012-12-27 MED ORDER — LEVOTHYROXINE SODIUM 100 MCG PO TABS
200.0000 ug | ORAL_TABLET | Freq: Every day | ORAL | Status: DC
  Administered 2012-12-28 – 2013-01-08 (×10): 200 ug via ORAL
  Filled 2012-12-27 (×12): qty 2

## 2012-12-27 NOTE — Consult Note (Signed)
Consult requested by: Dr. Lorriane Shire Consult requested for pneumonia:  HPI: This is a 62 year old who has a long known history of COPD. She has been having increasing shortness of breath for about a week and came to the emergency department with increasing shortness of breath and was treated there but was not well enough for discharge. She was found to have multilobar pneumonia on her chest x-ray. She has been confused and somewhat delirious since her hospitalization. She has an extensive past medical history as listed below including COPD and diabetes. She stopped smoking about a year ago. I can't get any other history at this point.  Past Medical History  Diagnosis Date  . Diabetes mellitus   . COPD (chronic obstructive pulmonary disease)   . ASCVD (arteriosclerotic cardiovascular disease)     MI in 96 requiring BMS CX; DES to M1 in 2000;normal coronary angiography in 2004  . Hyperlipidemia   . Hypothyroidism   . Tobacco abuse   . Depression   . DVT (deep venous thrombosis)   . Nephrolithiasis 2006    stone extraction   . Amputation of hand, right     traumatic  . Allergic rhinitis   . Cholelithiasis   . GERD (gastroesophageal reflux disease)   . Low back pain   . Peripheral neuropathy   . Diabetes mellitus type I   . DDD (degenerative disc disease), lumbar   . Sciatic pain     right  . Tremor      Family History  Problem Relation Age of Onset  . Depression Mother   . Bipolar disorder Mother   . Dementia Mother   . Alcohol abuse Father      History   Social History  . Marital Status: Married    Spouse Name: N/A    Number of Children: N/A  . Years of Education: N/A   Occupational History  . 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 Smoking status: Current Every Day Smoker -- 0.50 packs/day for 40 years    Types: Cigarettes  . Smokeless tobacco: None  . Alcohol Use: No  . Drug Use: No  . Sexually Active: No    Other Topics Concern  . None   Social History Narrative   Married x3   Lives with husband   GED in 2005     ROS: Unobtainable    Objective: Vital signs in last 24 hours: Temp:  [97.4 F (36.3 C)-98.3 F (36.8 C)] 97.8 F (36.6 C) (03/15 0553) Pulse Rate:  [78-118] 80 (03/15 0823) Resp:  [18-35] 18 (03/15 0823) BP: (98-149)/(60-95) 115/65 mmHg (03/15 0553) SpO2:  [73 %-96 %] 96 % (03/15 0823) Weight:  [79.107 kg (174 lb 6.4 oz)-84.369 kg (186 lb)] 79.107 kg (174 lb 6.4 oz) (03/14 1753) Weight change:  Last BM Date: 12/25/12  Intake/Output from previous day: 03/14 0701 - 03/15 0700 In: 240 [P.O.:240] Out: 100 [Urine:100]  PHYSICAL EXAM She is sleepy and has received some sedation. She is easily arousable but goes right back to sleep. Her pupils react. Her nose and throat clear. Her mucous membranes are slightly dry. Her neck does not show JVD. Her chest shows significant rhonchi bilaterally and some rales more on the left. Her heart is regular without gallop. Her abdomen is soft without masses. Extremities show no edema. 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 Central nervous system examination shows that she moves all  4 extremities but I can't make much more determinations and that because of her mental status.  Lab Results: Basic Metabolic Panel:  Recent Labs  12/26/12 1206 12/27/12 0532  NA 133* 137  K 4.3 4.1  CL 97 105  CO2 23 21  GLUCOSE 246* 104*  BUN 26* 39*  CREATININE 1.45* 1.56*  CALCIUM 9.1 8.3*  MG 1.4*  --    Liver Function Tests:  Recent Labs  12/26/12 1206 12/27/12 0532  AST 23 13  ALT 10 8  ALKPHOS 155* 91  BILITOT 0.6 0.3  PROT 7.1 6.0  ALBUMIN 2.2* 1.8*   No results found for this basename: LIPASE, AMYLASE,  in the last 72 hours No results found for this basename: AMMONIA,  in the last 72 hours CBC:  Recent Labs  12/26/12 1206  WBC 9.6  NEUTROABS 7.2  HGB 10.8*  HCT 33.1*  MCV 85.8  PLT 213   Cardiac  Enzymes:  Recent Labs  12/26/12 1206  TROPONINI <0.30   BNP: No results found for this basename: PROBNP,  in the last 72 hours D-Dimer: No results found for this basename: DDIMER,  in the last 72 hours CBG:  Recent Labs  12/26/12 2118 12/27/12 0713  GLUCAP 299* 88   Hemoglobin A1C: No results found for this basename: HGBA1C,  in the last 72 hours Fasting Lipid Panel: No results found for this basename: CHOL, HDL, LDLCALC, TRIG, CHOLHDL, LDLDIRECT,  in the last 72 hours Thyroid Function Tests: No results found for this basename: TSH, T4TOTAL, FREET4, T3FREE, THYROIDAB,  in the last 72 hours Anemia Panel: No results found for this basename: VITAMINB12, FOLATE, FERRITIN, TIBC, IRON, RETICCTPCT,  in the last 72 hours Coagulation: No results found for this basename: LABPROT, INR,  in the last 72 hours Urine Drug Screen: Drugs of Abuse  No results found for this basename: labopia, cocainscrnur, labbenz, amphetmu, thcu, labbarb    Alcohol Level: No results found for this basename: ETH,  in the last 72 hours Urinalysis:  Recent Labs  12/26/12 1437  COLORURINE AMBER*  LABSPEC 1.025  PHURINE 5.5  GLUCOSEU 100*  HGBUR LARGE*  BILIRUBINUR MODERATE*  KETONESUR NEGATIVE  PROTEINUR >300*  UROBILINOGEN 0.2  NITRITE NEGATIVE  LEUKOCYTESUR NEGATIVE   Misc. Labs:   ABGS:  Recent Labs  12/26/12 2215  PHART 7.329*  PO2ART 79.8*  TCO2 20.6  HCO3 21.7     MICROBIOLOGY: Recent Results (from the past 240 hour(s))  CULTURE, BLOOD (ROUTINE X 2)     Status: None   Collection Time    12/26/12  2:55 PM      Result Value Range Status   Specimen Description BLOOD LEFT ARM   Final   Special Requests BOTTLES DRAWN AEROBIC ONLY 6CC   Final   Culture NO GROWTH 1 DAY   Final   Report Status PENDING   Incomplete  CULTURE, BLOOD (ROUTINE X 2)     Status: None   Collection Time    12/26/12  3:10 PM      Result Value Range Status   Specimen Description BLOOD RIGHT ARM   Final    Special Requests BOTTLES DRAWN AEROBIC ONLY 6CC   Final   Culture NO GROWTH 1 DAY   Final   Report Status PENDING   Incomplete    Studies/Results: Ct Abdomen Pelvis Wo Contrast  12/26/2012  *RADIOLOGY REPORT*  Clinical Data: 62 year old female with right abdominal, flank and pelvic pain.  CT ABDOMEN AND PELVIS WITHOUT  CONTRAST  Technique:  Multidetector CT imaging of the abdomen and pelvis was performed following the standard protocol without intravenous contrast.  Comparison: 07/09/2007 CT.  Findings: Scattered areas of focal consolidation and airspace disease within both lung bases are identified - likely representing pneumonia.  Follow-up is recommended.  Hepatomegaly is again identified without evidence of focal hepatic abnormality. The spleen, adrenal glands, and pancreas are unremarkable. Multiple nonobstructing bilateral renal calculi are identified. There is no evidence of hydronephrosis or ureteral calculi.  Please note that parenchymal abnormalities may be missed as intravenous contrast was not administered.  A small amount of ascites is identified adjacent to the liver and within the pelvis.  No enlarged lymph nodes, biliary dilation or abdominal aortic aneurysm identified. Moderate to heavy aortic and iliac atherosclerotic calcifications again noted. The patient is status post cholecystectomy and hysterectomy.  The bowel and bladder are within normal limits.  Mild subcutaneous stranding/edema is noted. No acute or suspicious bony abnormalities are identified.  IMPRESSION: Consolidation and airspace disease within both lower lungs likely representing pneumonia.  Radiographic follow up to resolution recommended.  Tiny amount of abdominal pelvic ascites - nonspecific.  Mild subcutaneous edema.  Hepatomegaly and nonobstructing bilateral renal calculi.   Original Report Authenticated By: Margarette Canada, M.D.    Ct Head Wo Contrast  12/27/2012  *RADIOLOGY REPORT*  Clinical Data: Altered mental status  and agitation.  History of diabetes, vascular disease, DVT, and multiple surgeries.  CT HEAD WITHOUT CONTRAST  Technique:  Contiguous axial images were obtained from the base of the skull through the vertex without contrast.  Comparison: 02/10/2008  Findings: Study is somewhat technically limited due to motion artifact.  Mild cerebral atrophy.  No ventricular dilatation.  No mass effect or midline shift.  No abnormal extra-axial fluid collections.  Pearline Cables- white matter junctions are distinct.  Basal cisterns are not effaced.  No evidence of acute intracranial hemorrhage. Intracranial contents are not significantly changed since previous study.  No depressed skull fractures.  There is opacification of the ethmoid air cells bilaterally with small amount of fluid or membrane thickening in the sphenoid sinuses.  Air-fluid levels are present in both maxillary antra.  Changes suggest possible sinusitis.  Mastoid air cells are not opacified.  IMPRESSION: No acute intracranial abnormalities.  Presumed inflammatory changes in the paranasal sinuses with air-fluid levels in the maxillary antra suggesting sinusitis.   Original Report Authenticated By: Lucienne Capers, M.D.    Dg Chest Port 1 View  12/26/2012  *RADIOLOGY REPORT*  Clinical Data: Shortness of breath.  PORTABLE CHEST - 1 VIEW  Comparison: Single view of the chest 07/06/2012 and PA and lateral chest 08/19/2010.  Findings: There is extensive patchy bilateral airspace disease. Heart size is upper normal.  No pneumothorax or pleural effusion.  IMPRESSION: Extensive bilateral airspace disease is most likely due to multi focal pneumonia rather than asymmetric edema.   Original Report Authenticated By: Orlean Patten, M.D.     Medications:  Prior to Admission:  Prescriptions prior to admission  Medication Sig Dispense Refill  . ARIPiprazole (ABILIFY) 5 MG tablet Take 1 tablet (5 mg total) by mouth daily.  30 tablet  1  . aspirin 81 MG chewable tablet Chew 81 mg  by mouth daily.      . beclomethasone (QVAR) 40 MCG/ACT inhaler Inhale 2 puffs into the lungs 2 (two) times daily.        . benztropine (COGENTIN) 0.5 MG tablet TAKE 1 TABLET (0.5 MG TOTAL)  BY MOUTH DAILY.  30 tablet  0  . clindamycin (CLEOCIN) 300 MG capsule Take 300 mg by mouth 3 (three) times daily.       . clonazePAM (KLONOPIN) 0.5 MG tablet Take 0.5-1 mg by mouth 2 (two) times daily. Take 1 in am and 2 at bed time      . colesevelam (WELCHOL) 625 MG tablet Take 1,875 mg by mouth 2 (two) times daily with a meal.        . DULoxetine (CYMBALTA) 20 MG capsule Take 2 capsules (40 mg total) by mouth 2 (two) times daily.  120 capsule  1  . esomeprazole (NEXIUM) 40 MG capsule Take 40 mg by mouth daily before breakfast.        . fish oil-omega-3 fatty acids 1000 MG capsule Take 2 g by mouth daily.        Marland Kitchen gabapentin (NEURONTIN) 100 MG capsule Take 1 capsule by mouth 3 (three) times daily.      Marland Kitchen glyBURIDE (DIABETA) 5 MG tablet Take 5 mg by mouth 2 (two) times daily.       Marland Kitchen levothyroxine (SYNTHROID, LEVOTHROID) 200 MCG tablet Take 400 mcg by mouth daily.      Marland Kitchen lisinopril (PRINIVIL,ZESTRIL) 20 MG tablet Take 20 mg by mouth daily.        . metFORMIN (GLUCOPHAGE) 500 MG tablet Take 500 mg by mouth 2 (two) times daily.      . metoprolol (LOPRESSOR) 50 MG tablet Take 25 mg by mouth 2 (two) times daily.       . naproxen (NAPROSYN) 500 MG tablet Take 500 mg by mouth 2 (two) times daily.       . niacin (NIASPAN) 1000 MG CR tablet Take 1,000 mg by mouth at bedtime.        . nitroGLYCERIN (NITROSTAT) 0.4 MG SL tablet Place 0.4 mg under the tongue every 5 (five) minutes as needed. Chest pain      . pravastatin (PRAVACHOL) 40 MG tablet Take 40 mg by mouth daily.        . traZODone (DESYREL) 100 MG tablet Take 1 tablet (100 mg total) by mouth at bedtime.  30 tablet  1   Scheduled: . albuterol  2.5 mg Nebulization QID   And  . ipratropium  0.5 mg Nebulization Q4H  . ARIPiprazole  5 mg Oral Daily  .  aspirin  81 mg Oral Daily  . azithromycin  500 mg Intravenous Q24H  . benztropine  0.5 mg Oral Daily  . cefTRIAXone (ROCEPHIN)  IV  1 g Intravenous Q24H  . colesevelam  1,875 mg Oral BID WC  . DULoxetine  40 mg Oral BID  . [START ON 12/28/2012] enoxaparin (LOVENOX) injection  40 mg Subcutaneous Daily  . glyBURIDE  5 mg Oral BID AC  . insulin aspart  12 Units Subcutaneous TID WC  . insulin glargine  35 Units Subcutaneous QHS  . [START ON 12/28/2012] levothyroxine  200 mcg Oral QAC breakfast  . lisinopril  20 mg Oral Daily  . methylPREDNISolone sodium succinate  125 mg Intravenous Q8H  . metoprolol  25 mg Oral BID  . niacin  1,000 mg Oral QHS  . omega-3 acid ethyl esters  2 g Oral Daily  . pantoprazole  80 mg Oral Daily  . simvastatin  20 mg Oral q1800  . traZODone  100 mg Oral QHS  . vancomycin  1,000 mg Intravenous Q24H   Continuous: . sodium chloride 75 mL/hr at 12/26/12 1812   TUU:[id number], nitroGLYCERIN  Assesment: She is admitted with multilobar pneumonia. She has COPD as well. She seems to be somewhat delirious. He is not clear if she had a flulike illness prior to her pneumonia so I'm going to have her get a influenza PCR and because of the potential that she may have a worse pneumonia than typical community acquired pneumonia considering the multilobar nature and her delirium I'm going to want her to be on vancomycin Principal Problem:   Pneumonia due to infectious agent Active Problems:   DIABETES MELLITUS, TYPE II, CONTROLLED   HYPERLIPIDEMIA   ANXIETY STATE NOS   DEPRESSION   COPD    Plan: Add vancomycin as above. Influenza PCR.    LOS: 1 day   Narciso Stoutenburg L 12/27/2012, 10:25 AM

## 2012-12-27 NOTE — Progress Notes (Signed)
CRITICAL VALUE ALERT  Critical value received:  Flu positive  Date of notification:  12/27/12  Time of notification:  1300  Critical value read back:yes  Nurse who received alert:  Jerrye Noble RN  MD notified (1st page):  Willey Blade  Time of first page:  1300  MD notified (2nd page):  Time of second page:  Responding MD:  Willey Blade  Time MD responded:  [phone]

## 2012-12-27 NOTE — Progress Notes (Signed)
ANTICOAGULATION CONSULT NOTE - Initial Consult  Pharmacy Consult for Lovenox Indication: VTE prophylaxis  Allergies  Allergen Reactions  . Ciprofloxacin     REACTION: Rash  . Iohexol      Desc: CHEST TIGHTNESS,BRETHING PROBLEMS NEEDS PRE MEDS   . Neomycin-Bacitracin Zn-Polymyx     REACTION: Rash  . Penicillins     REACTION: Rash  . Povidone     REACTION: Rash    Patient Measurements: Height: 5' 6"  (167.6 cm) Weight: 174 lb 6.4 oz (79.107 kg) IBW/kg (Calculated) : 59.3  Vital Signs: Temp: 97.8 F (36.6 C) (03/15 0553) Temp src: Oral (03/15 0553) BP: 115/65 mmHg (03/15 0553) Pulse Rate: 80 (03/15 0823)  Labs:  Recent Labs  12/26/12 1206 12/27/12 0532  HGB 10.8*  --   HCT 33.1*  --   PLT 213  --   CREATININE 1.45* 1.56*  TROPONINI <0.30  --     Estimated Creatinine Clearance: 40.2 ml/min (by C-G formula based on Cr of 1.56).   Medical History: Past Medical History  Diagnosis Date  . Diabetes mellitus   . COPD (chronic obstructive pulmonary disease)   . ASCVD (arteriosclerotic cardiovascular disease)     MI in 96 requiring BMS CX; DES to M1 in 2000;normal coronary angiography in 2004  . Hyperlipidemia   . Hypothyroidism   . Tobacco abuse   . Depression   . DVT (deep venous thrombosis)   . Nephrolithiasis 2006    stone extraction   . Amputation of hand, right     traumatic  . Allergic rhinitis   . Cholelithiasis   . GERD (gastroesophageal reflux disease)   . Low back pain   . Peripheral neuropathy   . Diabetes mellitus type I   . DDD (degenerative disc disease), lumbar   . Sciatic pain     right  . Tremor     Medications:  Scheduled:  . albuterol  2.5 mg Nebulization QID   And  . ipratropium  0.5 mg Nebulization Q4H  . [COMPLETED] albuterol      . [COMPLETED] albuterol  15 mg Nebulization Once  . ARIPiprazole  5 mg Oral Daily  . aspirin  81 mg Oral Daily  . azithromycin  500 mg Intravenous Q24H  . benztropine  0.5 mg Oral Daily  .  [COMPLETED] cefTRIAXone (ROCEPHIN)  IV  1 g Intravenous Once  . cefTRIAXone (ROCEPHIN)  IV  1 g Intravenous Q24H  . colesevelam  1,875 mg Oral BID WC  . DULoxetine  40 mg Oral BID  . [START ON 12/28/2012] enoxaparin (LOVENOX) injection  40 mg Subcutaneous Daily  . glyBURIDE  5 mg Oral BID AC  . insulin aspart  12 Units Subcutaneous TID WC  . insulin glargine  35 Units Subcutaneous QHS  . [COMPLETED] ipratropium      . [COMPLETED] ipratropium  1 mg Nebulization Once  . [START ON 12/28/2012] levothyroxine  200 mcg Oral QAC breakfast  . lisinopril  20 mg Oral Daily  . [COMPLETED] LORazepam  0.5 mg Oral Once  . [COMPLETED] methylPREDNISolone (SOLU-MEDROL) injection  125 mg Intravenous Once  . methylPREDNISolone sodium succinate  125 mg Intravenous Q8H  . metoprolol  25 mg Oral BID  . niacin  1,000 mg Oral QHS  . omega-3 acid ethyl esters  2 g Oral Daily  . pantoprazole  80 mg Oral Daily  . simvastatin  20 mg Oral q1800  . traZODone  100 mg Oral QHS  . [DISCONTINUED] albuterol  2.5  mg Nebulization Q4H  . [DISCONTINUED] aspirin  81 mg Oral Daily  . [DISCONTINUED] azithromycin  500 mg Oral Once  . [DISCONTINUED] benztropine  0.5 mg Oral Daily  . [DISCONTINUED] cefTRIAXone (ROCEPHIN)  IV  1 g Intravenous Q24H  . [DISCONTINUED] enoxaparin (LOVENOX) injection  80 mg Subcutaneous Q12H  . [DISCONTINUED] ipratropium  0.5 mg Nebulization Q4H  . [DISCONTINUED] ipratropium-albuterol  3 mL Nebulization Q4H  . [DISCONTINUED] levothyroxine  400 mcg Oral QAC breakfast  . [DISCONTINUED] lisinopril  20 mg Oral Daily  . [DISCONTINUED] metFORMIN  500 mg Oral BID  . [DISCONTINUED] metFORMIN  500 mg Oral BID  . [DISCONTINUED] pantoprazole  40 mg Oral Daily    Assessment: Okay for Protocol Estimated Creatinine Clearance: 40.2 ml/min (by C-G formula based on Cr of 1.56). Previously ordered for treatment dose of VTE, clarified with MD and dose should be for VTE prophylaxis.  Goal of Therapy:  Prophylaxis  dosing.   Plan:  Change Lovenox to 60m SQ every 24 hours.  HPricilla Larsson3/15/2014,9:38 AM

## 2012-12-27 NOTE — Progress Notes (Signed)
ANTIBIOTIC CONSULT NOTE  Pharmacy Consult for Vancomycin Indication: rule out pneumonia  Allergies  Allergen Reactions  . Ciprofloxacin     REACTION: Rash  . Iohexol      Desc: CHEST TIGHTNESS,BRETHING PROBLEMS NEEDS PRE MEDS   . Neomycin-Bacitracin Zn-Polymyx     REACTION: Rash  . Penicillins     REACTION: Rash  . Povidone     REACTION: Rash    Patient Measurements: Height: 5' 6"  (167.6 cm) Weight: 174 lb 6.4 oz (79.107 kg) IBW/kg (Calculated) : 59.3  Vital Signs: Temp: 97.8 F (36.6 C) (03/15 0553) Temp src: Oral (03/15 0553) BP: 115/65 mmHg (03/15 0553) Pulse Rate: 80 (03/15 0823) Intake/Output from previous day: 03/14 0701 - 03/15 0700 In: 240 [P.O.:240] Out: 100 [Urine:100] Intake/Output from this shift: Total I/O In: 100 [P.O.:100] Out: -   Labs:  Recent Labs  12/26/12 1206 12/27/12 0532  WBC 9.6  --   HGB 10.8*  --   PLT 213  --   CREATININE 1.45* 1.56*   Estimated Creatinine Clearance: 40.2 ml/min (by C-G formula based on Cr of 1.56). No results found for this basename: VANCOTROUGH, Corlis Leak, VANCORANDOM, [place], GENTPEAK, GENTRANDOM, TOBRATROUGH, TOBRAPEAK, TOBRARND, AMIKACINPEAK, AMIKACINTROU, AMIKACIN,  in the last 72 hours   Microbiology: Recent Results (from the past 720 hour(s))  CULTURE, BLOOD (ROUTINE X 2)     Status: None   Collection Time    12/26/12  2:55 PM      Result Value Range Status   Specimen Description BLOOD LEFT ARM   Final   Special Requests BOTTLES DRAWN AEROBIC ONLY 6CC   Final   Culture NO GROWTH 1 DAY   Final   Report Status PENDING   Incomplete  CULTURE, BLOOD (ROUTINE X 2)     Status: None   Collection Time    12/26/12  3:10 PM      Result Value Range Status   Specimen Description BLOOD RIGHT ARM   Final   Special Requests BOTTLES DRAWN AEROBIC ONLY [place]   Final   Culture NO GROWTH 1 DAY   Final   Report Status PENDING   Incomplete    Medical History: Past Medical History  Diagnosis Date  . Diabetes  mellitus   . COPD (chronic obstructive pulmonary disease)   . ASCVD (arteriosclerotic cardiovascular disease)     MI in 96 requiring BMS CX; DES to M1 in 2000;normal coronary angiography in 2004  . Hyperlipidemia   . Hypothyroidism   . Tobacco abuse   . Depression   . DVT (deep venous thrombosis)   . Nephrolithiasis 2006    stone extraction   . Amputation of hand, right     traumatic  . Allergic rhinitis   . Cholelithiasis   . GERD (gastroesophageal reflux disease)   . Low back pain   . Peripheral neuropathy   . Diabetes mellitus type I   . DDD (degenerative disc disease), lumbar   . Sciatic pain     right  . Tremor     Medications:  Scheduled:  . albuterol  2.5 mg Nebulization QID   And  . ipratropium  0.5 mg Nebulization Q4H  . [COMPLETED] albuterol      . [COMPLETED] albuterol  15 mg Nebulization Once  . ARIPiprazole  5 mg Oral Daily  . aspirin  81 mg Oral Daily  . azithromycin  500 mg Intravenous Q24H  . benztropine  0.5 mg Oral Daily  . [COMPLETED] cefTRIAXone (ROCEPHIN)  IV  1 g Intravenous Once  . cefTRIAXone (ROCEPHIN)  IV  1 g Intravenous Q24H  . colesevelam  1,875 mg Oral BID WC  . DULoxetine  40 mg Oral BID  . [START ON 12/28/2012] enoxaparin (LOVENOX) injection  40 mg Subcutaneous Daily  . glyBURIDE  5 mg Oral BID AC  . insulin aspart  12 Units Subcutaneous TID WC  . insulin glargine  35 Units Subcutaneous QHS  . [COMPLETED] ipratropium      . [COMPLETED] ipratropium  1 mg Nebulization Once  . [START ON 12/28/2012] levothyroxine  200 mcg Oral QAC breakfast  . lisinopril  20 mg Oral Daily  . [COMPLETED] LORazepam  0.5 mg Oral Once  . [COMPLETED] methylPREDNISolone (SOLU-MEDROL) injection  125 mg Intravenous Once  . methylPREDNISolone sodium succinate  125 mg Intravenous Q8H  . metoprolol  25 mg Oral BID  . niacin  1,000 mg Oral QHS  . omega-3 acid ethyl esters  2 g Oral Daily  . pantoprazole  80 mg Oral Daily  . simvastatin  20 mg Oral q1800  .  traZODone  100 mg Oral QHS  . [DISCONTINUED] albuterol  2.5 mg Nebulization Q4H  . [DISCONTINUED] aspirin  81 mg Oral Daily  . [DISCONTINUED] azithromycin  500 mg Oral Once  . [DISCONTINUED] benztropine  0.5 mg Oral Daily  . [DISCONTINUED] cefTRIAXone (ROCEPHIN)  IV  1 g Intravenous Q24H  . [DISCONTINUED] enoxaparin (LOVENOX) injection  80 mg Subcutaneous Q12H  . [DISCONTINUED] ipratropium  0.5 mg Nebulization Q4H  . [DISCONTINUED] ipratropium-albuterol  3 mL Nebulization Q4H  . [DISCONTINUED] levothyroxine  400 mcg Oral QAC breakfast  . [DISCONTINUED] lisinopril  20 mg Oral Daily  . [DISCONTINUED] metFORMIN  500 mg Oral BID  . [DISCONTINUED] metFORMIN  500 mg Oral BID  . [DISCONTINUED] pantoprazole  40 mg Oral Daily   Assessment: Okay for Protocol Estimated Creatinine Clearance: 40.2 ml/min (by C-G formula based on Cr of 1.56). Patient also receiving Rocephin and Zithromax for suspected PNA.  Goal of Therapy:  Vancomycin trough level 15-20 mcg/ml  Plan:  Vancomycin 1025m IV every 24 hours. Measure antibiotic drug levels at steady state Follow up culture results  HPricilla Larsson3/15/2014,10:08 AM

## 2012-12-27 NOTE — Progress Notes (Signed)
Patient insulin held today d/t hypoglycemia and decreased appetite.  Patient drinking fluids, but does not feel like eating much.  Needs encouragement with meals

## 2012-12-27 NOTE — Progress Notes (Signed)
PHARMACIST - PHYSICIAN COMMUNICATION DR:  Cindie Laroche CONCERNING:  METFORMIN SAFE ADMINISTRATION POLICY  RECOMMENDATION: Metformin has been placed on DISCONTINUE (rejected order) STATUS and should be reordered only after any of the conditions below are ruled out.  Current safety recommendations include avoiding metformin for a minimum of 48 hours after the patient's exposure to intravenous contrast media.  DESCRIPTION:  The Pharmacy Committee has adopted a policy that restricts the use of metformin in hospitalized patients until all the contraindications to administration have been ruled out. Specific contraindications are: []  Serum creatinine ? 1.5 for males [x]  Serum creatinine ? 1.4 for females []  Shock, acute MI, sepsis, hypoxemia, dehydration []  Planned administration of intravenous iodinated contrast media []  Heart Failure patients with low EF []  Acute or chronic metabolic acidosis (including DKA)  Pricilla Larsson, [facility] 12/27/2012 8:06 AM

## 2012-12-27 NOTE — Progress Notes (Signed)
[id number] 

## 2012-12-27 NOTE — Progress Notes (Addendum)
Patient was difficult to arouse upon assessment. Found to have change in mental status. She states she is at the [facility] and will not remain in bed. Needed to use commode urgently and frequently. Therefore Md was notified and urinary catheter was ordered. AMS continued and Dr. Willey Blade was notified. CT of head was ordered. No acute findings were found. Safety sitter is requested. Patient pulled out IV at 0545.

## 2012-12-27 NOTE — Progress Notes (Signed)
Hypoglycemic Event  CBG: 56  Treatment: 15 GM carbohydrate snack  Symptoms: Shaky  Follow-up CBG: Time:1215 CBG Result:91  Possible Reasons for Event: Inadequate meal intake  Comments/MD notified:Resolved with cup of juice    Marcell Anger, Marveen Reeks  Remember to initiate Hypoglycemia Order Set & complete

## 2012-12-27 NOTE — Progress Notes (Signed)
[name], PASCHAL NO.:  [id number]  MEDICAL RECORD NO.:  [id number]  LOCATION:  F121                          FACILITY:  [facility]  PHYSICIAN:  Unk Lightning, MDDATE OF BIRTH:  June 01, 1951  DATE OF PROCEDURE: DATE OF DISCHARGE:                                PROGRESS NOTE   This 62 year old white female with multilobar pneumonia, COPD, significant psychiatric overlay, benign essential tremor, major depression, anxiety, passive-aggressive behavior, hypothyroidism, noncompliance, in and out of hospital, insulin-dependent diabetes.  The patient was admitted yesterday with moderate respiratory distress, placed on dual antibiotics for multilobar pneumonia, Rocephin, Zithromax IV, Solu-Medrol, as well as DuoNeb nebulizer.  She was hemodynamically stable.  The patient began not answering questions last night to nursing staff.  Blood gas was done revealing a pH of 7.32, pCO2 of 42, pO2 of 80 with a 96% sat.  Electrolytes were essentially within normal limits. Blood pressure 115/65, temperature 97.8, pulse 78 and regular, respiratory rate is 26.  Lungs showed diminished breath sounds at the bases.  Mild-to-moderate inspiratory and expiratory wheezes, some accessory use of muscles of respiration.  The patient is tremulous which is her baseline tremor.  TSH is elevated at 9 which is better than her normal.  Shows some compliance with Synthroid.  Creatinine 1.56.  CT of head was essentially unremarkable, some mild questionable sinusitis only marked.  Sodium within normal limits.  A long talk with the patient she pulled out her IV.  She refused to wear BiPAP, she currently is on 6 L. Told the patient she needs BiPAP will help her, she refuses.  Risks were explained, death and ventilator dependency.  She seems to understand this and accepts the risk.  The patient took precisely 8 minutes and answered the question whether she would allow an IV to go back in.   She finally relented and we will replace IV to continue IV Rocephin, Zithromax, Solu-Medrol, continue nebulizer therapy.  Monitor CBC, BMET in a.m.  The patient has a 1:1 sitter for chronic passive aggressive behavior and noncompliance.     Unk Lightning, MD     RMD/MEDQ  D:  12/27/2012  T:  12/27/2012  Job:  [id number]

## 2012-12-27 NOTE — Progress Notes (Signed)
Patient positive for flu.  Dr. Willey Blade on call and notified.  Orders for tamiflu given.  Already receiving appropriate abx

## 2012-12-27 NOTE — H&P (Signed)
[name], [name] NO.:  [id number]  MEDICAL RECORD NO.:  [id number]  LOCATION:  Z025                          FACILITY:  [facility]  PHYSICIAN:  Unk Lightning, MDDATE OF BIRTH:  [birth year]  DATE OF ADMISSION:  12/26/2012 DATE OF DISCHARGE:  LH                             HISTORY & PHYSICAL   HISTORY OF PRESENT ILLNESS:  The patient is a 62 year old white female who complains of increasing dyspnea over 3-4 day period, seen to have significant bronchospasm given bronchodilators and steroids recently. Reports to the ER today with continued symptomatology and is found to have a multilobar infiltrate in addition to significant moderate respiratory distress.  The patient is admitted for COPD exacerbation, asthmatic bronchitis, with multilobar pneumonia to be placed on Rocephin and Zithromax as well as DuoNeb, Solu-Medrol therapy.  The patient smoked for 30 years a pack per day, but quit 1 year ago.  PAST MEDICAL HISTORY:  Significant for benign essential tremor, COPD, insulin-dependent diabetes, depression, anxiety, hypertension, hyperlipidemia, as well as GERD.  In addition to that, past medical history significant for peripheral neuropathy secondary to diabetes. Also hypothyroidism.  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  ALLERGIES:  She states she may be allergic to PENICILLIN, is not certain.  SOCIAL HISTORY:  She lives with her husband, is married for the 2nd time.  Smoked, quit 1 year ago.  Does not imbibe alcohol.  Is questionably compliant with her medicines.  REVIEW OF SYSTEMS:  Negative for polyuria, polydipsia, seizures, nausea, vomiting, melena, hematemesis, hematochezia, chest pain.  PHYSICAL EXAMINATION:  VITAL SIGNS:  Blood pressure is 130/95, respiratory rate is 30 and regular, temperature 98.2, pulse is 110 and regular, sinus rhythm, O2 sat is 92% on  room air. EYES:  PERRLA intact.  Sclerae clear.  Conjunctivae pink. NECK: No JVD.  No carotid bruits.  No thyromegaly.  No thyroid bruits. LUNGS: Diminished breath sounds at the bases.  Prolonged expiratory phase.  Moderate end-expiratory wheeze.  No rales audible. HEART:  Regular rate and rhythm. No S3, S4.  No heaves, thrills, or rubs. ABDOMEN:  Soft, nontender.  Bowel sounds normoactive.  No guarding, rebound, mass, or megaly. EXTREMITIES:  Right wrist amputation, 1+ pedal edema. NEUROLOGIC:  The patient has significant tremor chronically due to benign essential tremor and peripheral neuropathy.  Cranial nerves II through XII grossly intact.  The patient moves all 4 extremities. Plantars are downgoing.  LABORATORY DATA:  Hemoglobin is 10.8, creatinine 1.45.  Chest x-ray reveals extensive bilateral airspace disease, possible multifocal pneumonia.  CURRENT MEDICATIONS: 1. Trazodone 100 mg p.o. nightly. 2. Pravachol 40 mg p.o. daily. 3. Protonix 40 mg p.o. daily. 4. Niaspan 1000 mg p.o. nightly. 5. Lopressor 25 p.o. b.i.d. 6. Metformin 500 p.o. b.i.d. 7. Lisinopril 20 mg p.o. daily. 8. Synthroid 200 mcg p.o. daily. 9. Glyburide 5 mg p.o. daily. 10.Fish oil 1000 mg p.o. b.i.d. 11.Cymbalta 60 mg p.o. daily. 12.Welchol 625 p.o. b.i.d. 13.Aspirin 81 mg per day. 14.Abilify 5 mg p.o. daily. 15.Cogentin 0.5 mg p.o. daily.  IMPRESSION: 1. Multilobar pneumonia. 2. Chronic obstructive pulmonary disease exacerbation. 3. Insulin-dependent diabetes. 4. Hypothyroidism.  5. Chronic obstructive pulmonary disease. 6. Anxiety. 7. Depression. 8. Hyperlipidemia.  PLAN:  Right now is to admit, place her on Rocephin, Zithromax, DuoNeb nebulizer, Solu-Medrol 125 IV q.6h.  Continue Lantus 35 units nightly and NovoLog 12 units before meals t.i.d.  We will obtain Pulmonary consultation and make further recommendations as the database expands.     Unk Lightning,  MD     RMD/MEDQ  D:  12/26/2012  T:  12/27/2012  Job:  [id number]

## 2012-12-28 LAB — URINE CULTURE
Colony Count: NO GROWTH
Culture: NO GROWTH

## 2012-12-28 LAB — CBC WITH DIFFERENTIAL/PLATELET
Basophils Absolute: 0.1 10*3/uL (ref 0.0–0.1)
Eosinophils Absolute: 0 10*3/uL (ref 0.0–0.7)
Eosinophils Relative: 0 % (ref 0–5)
MCH: 28.5 pg (ref 26.0–34.0)
MCV: 86.1 fL (ref 78.0–100.0)
Platelets: 218 10*3/uL (ref 150–400)
RDW: 16.1 % — ABNORMAL HIGH (ref 11.5–15.5)

## 2012-12-28 LAB — FERRITIN: Ferritin: 118 ng/mL (ref 10–291)

## 2012-12-28 LAB — IRON AND TIBC
Iron: <10 ug/dL — ABNORMAL LOW (ref 42–135)
UIBC: 272 ug/dL (ref 125–400)

## 2012-12-28 LAB — HEPATIC FUNCTION PANEL
AST: 13 U/L (ref 0–37)
Albumin: 2 g/dL — ABNORMAL LOW (ref 3.5–5.2)
Alkaline Phosphatase: 95 U/L (ref 39–117)
Total Bilirubin: 0.2 mg/dL — ABNORMAL LOW (ref 0.3–1.2)

## 2012-12-28 LAB — BASIC METABOLIC PANEL
CO2: 19 mEq/L (ref 19–32)
Calcium: 8.5 mg/dL (ref 8.4–10.5)
Glucose, Bld: 198 mg/dL — ABNORMAL HIGH (ref 70–99)
Potassium: 4.6 mEq/L (ref 3.5–5.1)
Sodium: 133 mEq/L — ABNORMAL LOW (ref 135–145)

## 2012-12-28 LAB — GLUCOSE, CAPILLARY: Glucose-Capillary: 210 mg/dL — ABNORMAL HIGH (ref 70–99)

## 2012-12-28 MED ORDER — ALBUTEROL SULFATE (5 MG/ML) 0.5% IN NEBU: 2.5000 mg | INHALATION_SOLUTION | Freq: Four times a day (QID) | RESPIRATORY_TRACT | Status: DC

## 2012-12-28 MED ORDER — ALBUTEROL SULFATE (5 MG/ML) 0.5% IN NEBU
2.5000 mg | INHALATION_SOLUTION | Freq: Three times a day (TID) | RESPIRATORY_TRACT | Status: DC
  Administered 2012-12-28 – 2012-12-29 (×2): 2.5 mg via RESPIRATORY_TRACT
  Filled 2012-12-28 (×2): qty 0.5

## 2012-12-28 MED ORDER — IPRATROPIUM BROMIDE 0.02 % IN SOLN: 0.5000 mg | Freq: Three times a day (TID) | RESPIRATORY_TRACT | Status: DC

## 2012-12-28 MED ORDER — IPRATROPIUM BROMIDE 0.02 % IN SOLN
0.5000 mg | Freq: Three times a day (TID) | RESPIRATORY_TRACT | Status: DC
  Administered 2012-12-28 – 2012-12-29 (×2): 0.5 mg via RESPIRATORY_TRACT
  Filled 2012-12-28 (×2): qty 2.5

## 2012-12-28 NOTE — Progress Notes (Signed)
Subjective: She is still having episodes of confusion. She is very weak. She is sleeping off and on. She was positive for influenza  Objective: Vital signs in last 24 hours: Temp:  [97.6 F (36.4 C)-97.9 F (36.6 C)] 97.6 F (36.4 C) (03/16 0518) Pulse Rate:  [65-79] 65 (03/16 0518) Resp:  [19-20] 20 (03/16 0518) BP: (106-146)/(49-70) 106/49 mmHg (03/16 0518) SpO2:  [93 %-96 %] 96 % (03/16 0649) Weight change:  Last BM Date: 12/25/12  Intake/Output from previous day: 03/15 0701 - 03/16 0700 In: 580 [P.O.:580] Out: 800 [Urine:800]  PHYSICAL EXAM General appearance: mild distress and Sleepy but arousable Resp: rhonchi bilaterally Cardio: regular rate and rhythm, S1, S2 normal, no murmur, click, rub or gallop GI: soft, non-tender; bowel sounds normal; no masses,  no organomegaly Extremities: Previous right hand amputation  Lab Results:    Basic Metabolic Panel:  Recent Labs  12/26/12 1206 12/27/12 0532 12/28/12 0634  NA 133* 137 133*  K 4.3 4.1 4.6  CL 97 105 101  CO2 23 21 19   GLUCOSE 246* 104* 198*  BUN 26* 39* 50*  CREATININE 1.45* 1.56* 1.61*  CALCIUM 9.1 8.3* 8.5  MG 1.4*  --   --    Liver Function Tests:  Recent Labs  12/27/12 0532 12/28/12 0634  AST 13 13  ALT 8 9  ALKPHOS 91 95  BILITOT 0.3 0.2*  PROT 6.0 6.7  ALBUMIN 1.8* 2.0*   No results found for this basename: LIPASE, AMYLASE,  in the last 72 hours No results found for this basename: AMMONIA,  in the last 72 hours CBC:  Recent Labs  12/26/12 1206 12/28/12 0711  WBC 9.6 6.4  NEUTROABS 7.2 4.9  HGB 10.8* 9.4*  HCT 33.1* 28.4*  MCV 85.8 86.1  PLT 213 218   Cardiac Enzymes:  Recent Labs  12/26/12 1206  TROPONINI <0.30   BNP: No results found for this basename: PROBNP,  in the last 72 hours D-Dimer: No results found for this basename: DDIMER,  in the last 72 hours CBG:  Recent Labs  12/27/12 0713 12/27/12 1128 12/27/12 1207 12/27/12 1645 12/27/12 2105 12/28/12 0727   GLUCAP 88 56* 91 184* 239* 191*   Hemoglobin A1C: No results found for this basename: HGBA1C,  in the last 72 hours Fasting Lipid Panel: No results found for this basename: CHOL, HDL, LDLCALC, TRIG, CHOLHDL, LDLDIRECT,  in the last 72 hours Thyroid Function Tests:  Recent Labs  12/27/12 0532  TSH 0.714   Anemia Panel: No results found for this basename: VITAMINB12, FOLATE, FERRITIN, TIBC, IRON, RETICCTPCT,  in the last 72 hours Coagulation: No results found for this basename: LABPROT, INR,  in the last 72 hours Urine Drug Screen: Drugs of Abuse  No results found for this basename: labopia, cocainscrnur, labbenz, amphetmu, thcu, labbarb    Alcohol Level: No results found for this basename: ETH,  in the last 72 hours Urinalysis:  Recent Labs  12/26/12 1437  COLORURINE AMBER*  LABSPEC 1.025  PHURINE 5.5  GLUCOSEU 100*  HGBUR LARGE*  BILIRUBINUR MODERATE*  KETONESUR NEGATIVE  PROTEINUR >300*  UROBILINOGEN 0.2  NITRITE NEGATIVE  LEUKOCYTESUR NEGATIVE   Misc. Labs:  ABGS  Recent Labs  12/26/12 2215  PHART 7.329*  PO2ART 79.8*  TCO2 20.6  HCO3 21.7   CULTURES Recent Results (from the past 240 hour(s))  URINE CULTURE     Status: None   Collection Time    12/26/12  2:37 PM  Result Value Range Status   Specimen Description URINE, CATHETERIZED   Final   Special Requests NONE   Final   Culture  Setup Time 12/27/2012 05:01   Final   Colony Count NO GROWTH   Final   Culture NO GROWTH   Final   Report Status 12/28/2012 FINAL   Final  CULTURE, BLOOD (ROUTINE X 2)     Status: None   Collection Time    12/26/12  2:55 PM      Result Value Range Status   Specimen Description BLOOD LEFT ARM   Final   Special Requests BOTTLES DRAWN AEROBIC ONLY 6CC   Final   Culture NO GROWTH 2 DAYS   Final   Report Status PENDING   Incomplete  CULTURE, BLOOD (ROUTINE X 2)     Status: None   Collection Time    12/26/12  3:10 PM      Result Value Range Status   Specimen  Description BLOOD RIGHT ARM   Final   Special Requests BOTTLES DRAWN AEROBIC ONLY 6CC   Final   Culture NO GROWTH 2 DAYS   Final   Report Status PENDING   Incomplete   Studies/Results: Ct Abdomen Pelvis Wo Contrast  12/26/2012  *RADIOLOGY REPORT*  Clinical Data: 63 year old female with right abdominal, flank and pelvic pain.  CT ABDOMEN AND PELVIS WITHOUT CONTRAST  Technique:  Multidetector CT imaging of the abdomen and pelvis was performed following the standard protocol without intravenous contrast.  Comparison: 07/09/2007 CT.  Findings: Scattered areas of focal consolidation and airspace disease within both lung bases are identified - likely representing pneumonia.  Follow-up is recommended.  Hepatomegaly is again identified without evidence of focal hepatic abnormality. The spleen, adrenal glands, and pancreas are unremarkable. Multiple nonobstructing bilateral renal calculi are identified. There is no evidence of hydronephrosis or ureteral calculi.  Please note that parenchymal abnormalities may be missed as intravenous contrast was not administered.  A small amount of ascites is identified adjacent to the liver and within the pelvis.  No enlarged lymph nodes, biliary dilation or abdominal aortic aneurysm identified. Moderate to heavy aortic and iliac atherosclerotic calcifications again noted. The patient is status post cholecystectomy and hysterectomy.  The bowel and bladder are within normal limits.  Mild subcutaneous stranding/edema is noted. No acute or suspicious bony abnormalities are identified.  IMPRESSION: Consolidation and airspace disease within both lower lungs likely representing pneumonia.  Radiographic follow up to resolution recommended.  Tiny amount of abdominal pelvic ascites - nonspecific.  Mild subcutaneous edema.  Hepatomegaly and nonobstructing bilateral renal calculi.   Original Report Authenticated By: Margarette Canada, M.D.    Ct Head Wo Contrast  12/27/2012  *RADIOLOGY REPORT*   Clinical Data: Altered mental status and agitation.  History of diabetes, vascular disease, DVT, and multiple surgeries.  CT HEAD WITHOUT CONTRAST  Technique:  Contiguous axial images were obtained from the base of the skull through the vertex without contrast.  Comparison: 02/10/2008  Findings: Study is somewhat technically limited due to motion artifact.  Mild cerebral atrophy.  No ventricular dilatation.  No mass effect or midline shift.  No abnormal extra-axial fluid collections.  Pearline Cables- white matter junctions are distinct.  Basal cisterns are not effaced.  No evidence of acute intracranial hemorrhage. Intracranial contents are not significantly changed since previous study.  No depressed skull fractures.  There is opacification of the ethmoid air cells bilaterally with small amount of fluid or membrane thickening in the sphenoid sinuses.  Air-fluid levels  are present in both maxillary antra.  Changes suggest possible sinusitis.  Mastoid air cells are not opacified.  IMPRESSION: No acute intracranial abnormalities.  Presumed inflammatory changes in the paranasal sinuses with air-fluid levels in the maxillary antra suggesting sinusitis.   Original Report Authenticated By: Lucienne Capers, M.D.    Dg Chest Port 1 View  12/26/2012  *RADIOLOGY REPORT*  Clinical Data: Shortness of breath.  PORTABLE CHEST - 1 VIEW  Comparison: Single view of the chest 07/06/2012 and PA and lateral chest 08/19/2010.  Findings: There is extensive patchy bilateral airspace disease. Heart size is upper normal.  No pneumothorax or pleural effusion.  IMPRESSION: Extensive bilateral airspace disease is most likely due to multi focal pneumonia rather than asymmetric edema.   Original Report Authenticated By: Orlean Patten, M.D.     Medications:  Scheduled: . ipratropium  0.5 mg Nebulization QID   And  . albuterol  2.5 mg Nebulization QID  . ARIPiprazole  5 mg Oral Daily  . aspirin  81 mg Oral Daily  . azithromycin  500 mg  Intravenous Q24H  . benztropine  0.5 mg Oral Daily  . cefTRIAXone (ROCEPHIN)  IV  1 g Intravenous Q24H  . colesevelam  1,875 mg Oral BID WC  . DULoxetine  40 mg Oral BID  . enoxaparin (LOVENOX) injection  40 mg Subcutaneous Daily  . glyBURIDE  5 mg Oral BID AC  . insulin aspart  12 Units Subcutaneous TID WC  . insulin glargine  35 Units Subcutaneous QHS  . levothyroxine  200 mcg Oral QAC breakfast  . lisinopril  20 mg Oral Daily  . methylPREDNISolone sodium succinate  125 mg Intravenous Q8H  . metoprolol  25 mg Oral BID  . niacin  1,000 mg Oral QHS  . omega-3 acid ethyl esters  2 g Oral Daily  . oseltamivir  75 mg Oral BID  . pantoprazole  80 mg Oral Daily  . simvastatin  20 mg Oral q1800  . traZODone  100 mg Oral QHS  . vancomycin  1,000 mg Intravenous Q24H   Continuous: . sodium chloride 75 mL/hr at 12/28/12 1050   HWT:UUEKCMKLK, clonazePAM, nitroGLYCERIN  Assesment: She has multilobar pneumonia. She is positive for influenza. She is now on vancomycin. Principal Problem:   Pneumonia due to infectious agent Active Problems:   DIABETES MELLITUS, TYPE II, CONTROLLED   HYPERLIPIDEMIA   ANXIETY STATE NOS   DEPRESSION   COPD    Plan: Continue vancomycin and other antibiotics. She does seem to have improved somewhat    LOS: 2 days   Tahesha Skeet L 12/28/2012, 10:54 AM

## 2012-12-28 NOTE — Progress Notes (Signed)
[name], [name] NO.:  [id number]  MEDICAL RECORD NO.:  [id number]  LOCATION:  G017                          FACILITY:  [facility]  PHYSICIAN:  Unk Lightning, MDDATE OF BIRTH:  1950-11-04  DATE OF PROCEDURE: DATE OF DISCHARGE:                                PROGRESS NOTE   HISTORY OF PRESENT ILLNESS:  The patient has multilobar pneumonia, COPD, question whether she actually quit smoking in the last year completely, reports insulin-dependent diabetes, altered mental status acute on chronic, major depression, anxiety, chronic noncompliance, hypertension, hypothyroidism.  The patient is currently treated on Rocephin, Zithromax with the addition of vancomycin along with aggressive nebulizer therapy and IV Solu-Medrol and seems somewhat improved.  She has benign essential tremor, which has been seen by Neurology previously.  PHYSICAL EXAMINATION:  VITAL SIGNS:  Blood pressure is 106/49, temperature 97.6, pulse is 65 and regular, respiratory rate is 20. LUNGS:  Diminished breath sounds in the bases.  Moderate inspiratory and expiratory wheezing.  Poor excursion of both lungs.  Scattered rhonchi. HEART:  Regular rhythm.  No S3, S4.  No heaves, thrills, or rubs. EXTREMITIES:  Trace to 1+ pedal edema. NEUROLOGIC:  She is alert, follows commands.  Answers questions in one- word answers.  This is no different than baseline.  LABORATORY DATA:  She likewise has a new anemia, hemoglobin was 10.1 on admission, currently 9.4, possibly delusional with a creatinine of 1.61 with some chronic renal failure.  PLAN:  Right now is to continue 3 antibiotics and nebulizer therapy, steroids, get stools for occult blood, anemia profile, and we will make further recommendations as the database expands.     Unk Lightning, MD     RMD/MEDQ  D:  12/28/2012  T:  12/28/2012  Job:  [id number]

## 2012-12-28 NOTE — Progress Notes (Signed)
[id number] 

## 2012-12-29 LAB — BASIC METABOLIC PANEL
BUN: 48 mg/dL — ABNORMAL HIGH (ref 6–23)
Chloride: 104 mEq/L (ref 96–112)
GFR calc Af Amer: 38 mL/min — ABNORMAL LOW (ref >90)
Glucose, Bld: 87 mg/dL (ref 70–99)
Potassium: 4.3 mEq/L (ref 3.5–5.1)

## 2012-12-29 LAB — GLUCOSE, CAPILLARY
Glucose-Capillary: 91 mg/dL (ref 70–99)
Glucose-Capillary: 83 mg/dL (ref 70–99)

## 2012-12-29 LAB — HEPATIC FUNCTION PANEL
ALT: 10 U/L (ref 0–35)
AST: 17 U/L (ref 0–37)
Albumin: 2.1 g/dL — ABNORMAL LOW (ref 3.5–5.2)
Bilirubin, Direct: 0.1 mg/dL (ref 0.0–0.3)

## 2012-12-29 MED ORDER — AZITHROMYCIN 250 MG PO TABS
500.0000 mg | ORAL_TABLET | Freq: Every day | ORAL | Status: DC
  Administered 2012-12-30: 500 mg via ORAL
  Filled 2012-12-29: qty 1
  Filled 2012-12-29 (×2): qty 2
  Filled 2012-12-29: qty 1

## 2012-12-29 MED ORDER — ALBUTEROL SULFATE (5 MG/ML) 0.5% IN NEBU
2.5000 mg | INHALATION_SOLUTION | RESPIRATORY_TRACT | Status: DC
  Administered 2012-12-29 – 2013-01-08 (×43): 2.5 mg via RESPIRATORY_TRACT
  Filled 2012-12-29 (×50): qty 0.5

## 2012-12-29 MED ORDER — IPRATROPIUM BROMIDE 0.02 % IN SOLN
0.5000 mg | RESPIRATORY_TRACT | Status: DC
  Administered 2012-12-29 – 2013-01-08 (×44): 0.5 mg via RESPIRATORY_TRACT
  Filled 2012-12-29 (×49): qty 2.5

## 2012-12-29 MED ORDER — FERROUS SULFATE 325 (65 FE) MG PO TABS: 325.0000 mg | ORAL_TABLET | Freq: Two times a day (BID) | ORAL | Status: DC

## 2012-12-29 NOTE — Clinical Documentation Improvement (Signed)
CHANGE MENTAL STATUS DOCUMENTATION CLARIFICATION   THIS DOCUMENT IS NOT A PERMANENT PART OF THE MEDICAL RECORD  TO RESPOND TO THE THIS QUERY, FOLLOW THE INSTRUCTIONS BELOW:  1. If needed, update documentation for the patient's encounter via the notes activity.  2. Access this query again and click edit on the In [place].  3. After updating, or not, click F2 to complete all highlighted (required) fields concerning your review. Select "additional documentation in the medical record" OR "no additional documentation provided".  4. Click Sign note button.  5. The deficiency will fall out of your In Basket *Please let us know if you are not able to complete this workflow by phone or e-mail (listed below).         12/29/12  Dear Dr. Cindie Laroche Rolley Sims  In an effort to better capture your patient's severity of illness, reflect appropriate length of stay and utilization of resources, a review of the patient medical record has revealed the following indicators.    Based on your clinical judgment, please clarify and document in a progress note and/or discharge summary the clinical condition associated with the following supporting information:  In responding to this query please exercise your independent judgment.  The fact that a query is asked, does not imply that any particular answer is desired or expected.  Please clarify Mental Status. Thank you.  Possible Clinical Conditions?  Encephalopathy (describe type if known)                       Anoxic                       Metabolic                       Toxic Acute delirium Acute exacerbation of known dementia (indicate type) New diagnosis of Dementia, Alzheimer's, cerebral atherosclerosis Hypoxemia / Hypoxia Other Condition__________________ Cannot Clinically Determine   Clinical Information:  Risk Factors: Shortness of breath w/ O2 sats 70s on Room air in ER Continued treatment without resolution Decreased renal  function  Signs & Symptoms: Delirium Agressive behavior with staff Noncompliance with respiratory options Vulgar language and threats towards staff   Diagnostics: Lab: BUN/CR/GFR 05/03/12 = 8/0.85 12/26/12 = 26/1.45/38 12/27/12 = 39/1.56/35 12/28/12 = 50/1.61/33 12/29/12 =48/1.64/33   Radiology: CT scan Head: No acute intracranial abnormalities  Treatment: Klonopin 0.64m bid prn anxiety O2 6L/m via [place]  Reviewed: Query answer found in Dr. DMerlene Laughternotes x 2-Metabolic encephalopathy.  Thank You,  DEstella HuskRN, MSN Clinical Documentation Specialist: Office# [phone]

## 2012-12-29 NOTE — Progress Notes (Signed)
Pt. Refused 0800 meds and prn meds for anxiety. Pt. Uses vulgar language and makes threats towards staff. Pt. Non-compliant with staff to provide proper nursing care.

## 2012-12-29 NOTE — Progress Notes (Signed)
Pt. Again refused meds. Education provided about importance of medication, still refused. Medication wasted.

## 2012-12-29 NOTE — Care Management Note (Signed)
    Page 1 of 1   01/08/2013     1:29:35 PM   CARE MANAGEMENT NOTE 01/08/2013  Patient:  [name], [name]   Account Number:  [id number]  Date Initiated:  12/29/2012  Documentation initiated by:  Theophilus Kinds  Subjective/Objective Assessment:   Pt admitted from home with multilobar pneumonia and H1N1. Pt lives with her husband and will return home at discharge. Pt has no equipment in the home.     Action/Plan:   Will continue to follow for [facility] needs.   Anticipated DC Date:  01/02/2013   Anticipated DC Plan:  [place]  CM consult      Choice offered to / List presented to:             Status of service:  Completed, signed off Medicare Important Message given?  YES (If response is "NO", the following Medicare IM given date fields will be blank) Date Medicare IM given:  01/08/2013 Date Additional Medicare IM given:    Discharge Disposition:  [place]  Per UR Regulation:    If discussed at Long Length of Stay Meetings, dates discussed:   01/01/2013  01/06/2013  01/08/2013    Comments:  01/08/13 1330 Christinia Gully, RN BSN CM Pt discharged to [facility] today. CSW aware and will arrange discharge to facility.  01/07/13 [place], RN BSN CM Pt having PICC placed today for IV AB. Pts husband to decide today if he will do SNF or [facility] for IV AB.  12/29/12 [place], RN BSN CM

## 2012-12-29 NOTE — Progress Notes (Signed)
PHARMACIST - PHYSICIAN COMMUNICATION DR:   Luan Pulling CONCERNING: Antibiotic IV to Oral Route Change Policy  RECOMMENDATION: This patient is receiving Zithromax by the intravenous route.  Based on criteria approved by the Pharmacy and Therapeutics Committee, the antibiotic(s) is/are being converted to the equivalent oral dose form(s).  DESCRIPTION: These criteria include:  Patient being treated for a respiratory tract infection, urinary tract infection, or cellulitis  The patient is not neutropenic and does not exhibit a GI malabsorption state  The patient is eating (either orally or via tube) and/or has been taking other orally administered medications for a least 24 hours  The patient is improving clinically and has a Tmax < 100.5  If you have questions about this conversion, please contact the Pharmacy Department  [x]   [phone] )  [facility] []   [phone] )  [facility]  []   [phone] )  [facility] []   [phone] )  [facility]   S. [place] Crane, PharmD

## 2012-12-29 NOTE — Progress Notes (Signed)
UR Chart Review Completed  

## 2012-12-29 NOTE — Progress Notes (Signed)
[id number] 

## 2012-12-29 NOTE — Progress Notes (Signed)
Inpatient Diabetes Program Recommendations  AACE/ADA: New Consensus Statement on Inpatient Glycemic Control (2013)  Target Ranges:  Prepandial:   less than 140 mg/dL      Peak postprandial:   less than 180 mg/dL (1-2 hours)      Critically ill patients:  140 - 180 mg/dL  Results for [name], [name] (MRN [id number]) as of 12/29/2012 11:13  Ref. Range 12/27/2012 07:13 12/27/2012 11:28 12/27/2012 12:07 12/27/2012 16:45 12/27/2012 21:05  Glucose-Capillary Latest Range: 70-99 mg/dL 88 56 (L) 91 184 (H) 239 (H)    Results for [name], [name] (MRN [id number]) as of 12/29/2012 11:13  Ref. Range 12/28/2012 07:27 12/28/2012 11:11 12/28/2012 16:05 12/28/2012 20:16 12/29/2012 08:03  Glucose-Capillary Latest Range: 70-99 mg/dL 191 (H) 210 (H) 75 110 (H) 86    Inpatient Diabetes Program Recommendations Correction (SSI): Please consider ordering Novolog correction scale, recommend starting with moderate correction and adjust scale accordingly. Insulin - Meal Coverage: Please discontinue meal coverage of 12 units TID with meals since patient has poor PO intake.  Novolog 12 units has only been given once as an inpatient on 3/16 @ 13:03 and follow up CBG was noted to be 75 mg/dl. Oral Agents: Please discontinue Glyburide due to renal insufficiency and increased risk of hypoglycemia.  Note: Patient has a history of diabetes and takes Glyburide 43m BID and Metformin 500 mg BID at home for diabetes management.  Currently, patient is ordered to receive  Lantus 35 units QHS, Novolog 12 units TID with meals, and Glyburide 544mBID for inpatient glycemic control.  Patient experienced hypoglycemia event on 3/15.  Likely due to poor PO, renal insufficiency, and oral diabetic agent.  Glyburide was not given on 3/16 @ 17:00 nor this morning 3/17 at 8:00.  Also noted that Novolog 12 units has only been given once as an inpatient and that was on 3/16 @ 13:03.  Please discontinue Glyburide and Novolog 12 units TID meal coverage.  Please  consider ordering Novolog moderate correction scale and adjust scale accordingly.  Will continue to follow.  Thanks, MaBarnie AldermanRN, BSN, CCWaureganiabetes Coordinator Inpatient Diabetes Program [phone]

## 2012-12-29 NOTE — Progress Notes (Signed)
Subjective: She is more alert but still seems somewhat delirious  Objective: Vital signs in last 24 hours: Temp:  [97.4 F (36.3 C)-97.8 F (36.6 C)] 97.4 F (36.3 C) (03/17 0448) Pulse Rate:  [72-83] 83 (03/17 0448) Resp:  [20-22] 22 (03/17 0448) BP: (105-139)/(62-85) 127/76 mmHg (03/17 0448) SpO2:  [84 %-98 %] 84 % (03/17 0735) Weight change:  Last BM Date: 12/25/12  Intake/Output from previous day: 03/16 0701 - 03/17 0700 In: 1080 [P.O.:1080] Out: 2750 [Urine:2750]  PHYSICAL EXAM General appearance: alert, cooperative and mild distress Resp: rhonchi bilaterally Cardio: regular rate and rhythm, S1, S2 normal, no murmur, click, rub or gallop GI: soft, non-tender; bowel sounds normal; no masses,  no organomegaly 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 Liver Function Tests:  Recent Labs  12/28/12 0634 12/29/12 0514  AST 13 17  ALT 9 10  ALKPHOS 95 95  BILITOT 0.2* 0.2*  PROT 6.7 6.6  ALBUMIN 2.0* 2.1*   No results found for this basename: LIPASE, AMYLASE,  in the last 72 hours No results found for this basename: AMMONIA,  in the last 72 hours CBC:  Recent Labs  12/26/12 1206 12/28/12 0711  WBC 9.6 6.4  NEUTROABS 7.2 4.9  HGB 10.8* 9.4*  HCT 33.1* 28.4*  MCV 85.8 86.1  PLT 213 218   Cardiac Enzymes:  Recent Labs  12/26/12 1206  TROPONINI <0.30   BNP: No results found for this basename: PROBNP,  in the last 72 hours D-Dimer: No results found for this basename: DDIMER,  in the last 72 hours CBG:  Recent Labs  12/27/12 2105 12/28/12 0727 12/28/12 1111 12/28/12 1605  12/28/12 2016 12/29/12 0803  GLUCAP 239* 191* 210* 75 110* 86   Hemoglobin A1C: No results found for this basename: HGBA1C,  in the last 72 hours Fasting Lipid Panel: No results found for this basename: CHOL, HDL, LDLCALC, TRIG, CHOLHDL, LDLDIRECT,  in the last 72 hours Thyroid Function Tests:  Recent Labs  12/27/12 0532  TSH 0.714   Anemia Panel:  Recent Labs  12/27/12 1200  VITAMINB12 786  FOLATE 5.2  FERRITIN 118  TIBC Not calculated due to Iron <10.  IRON <10*  RETICCTPCT 0.6   Coagulation: No results found for this basename: LABPROT, INR,  in the last 72 hours Urine Drug Screen: Drugs of Abuse  No results found for this basename: labopia, cocainscrnur, labbenz, amphetmu, thcu, labbarb    Alcohol Level: No results found for this basename: ETH,  in the last 72 hours Urinalysis:  Recent Labs  12/26/12 1437  COLORURINE AMBER*  LABSPEC 1.025  PHURINE 5.5  GLUCOSEU 100*  HGBUR LARGE*  BILIRUBINUR MODERATE*  KETONESUR NEGATIVE  PROTEINUR >300*  UROBILINOGEN 0.2  NITRITE NEGATIVE  LEUKOCYTESUR NEGATIVE   Misc. Labs:  ABGS  Recent Labs  12/26/12 2215  PHART 7.329*  PO2ART 79.8*  TCO2 20.6  HCO3 21.7   CULTURES Recent Results (from the past 240 hour(s))  URINE CULTURE     Status: None   Collection Time    12/26/12  2:37 PM      Result Value Range Status   Specimen Description URINE, CATHETERIZED   Final   Special Requests NONE   Final   Culture  Setup Time 12/27/2012 05:01   Final   Colony Count NO GROWTH   Final   Culture NO GROWTH   Final   Report Status 12/28/2012 FINAL   Final  CULTURE, BLOOD (ROUTINE X 2)     Status: None   Collection Time    12/26/12  2:55 PM      Result Value Range Status   Specimen Description BLOOD LEFT ARM   Final   Special Requests BOTTLES DRAWN AEROBIC ONLY 6CC   Final   Culture NO GROWTH 2 DAYS   Final   Report Status PENDING   Incomplete  CULTURE, BLOOD (ROUTINE X 2)     Status: None   Collection Time     12/26/12  3:10 PM      Result Value Range Status   Specimen Description BLOOD RIGHT ARM   Final   Special Requests BOTTLES DRAWN AEROBIC ONLY 6CC   Final   Culture NO GROWTH 2 DAYS   Final   Report Status PENDING   Incomplete   Studies/Results: No results found.  Medications:  Scheduled: . albuterol  2.5 mg Nebulization TID   And  . ipratropium  0.5 mg Nebulization TID  . ARIPiprazole  5 mg Oral Daily  . aspirin  81 mg Oral Daily  . azithromycin  500 mg Intravenous Q24H  . benztropine  0.5 mg Oral Daily  . cefTRIAXone (ROCEPHIN)  IV  1 g Intravenous Q24H  . colesevelam  1,875 mg Oral BID WC  . DULoxetine  40 mg Oral BID  . enoxaparin (LOVENOX) injection  40 mg Subcutaneous Daily  . glyBURIDE  5 mg Oral BID AC  . insulin aspart  12 Units Subcutaneous TID WC  . insulin glargine  35 Units Subcutaneous QHS  . levothyroxine  200 mcg Oral QAC breakfast  . lisinopril  20 mg Oral Daily  . methylPREDNISolone sodium succinate  125 mg Intravenous Q8H  . metoprolol  25 mg Oral BID  . niacin  1,000 mg Oral QHS  . omega-3 acid ethyl esters  2 g Oral Daily  . oseltamivir  75 mg Oral BID  . pantoprazole  80 mg Oral Daily  . simvastatin  20 mg Oral q1800  . traZODone  100 mg Oral QHS  . vancomycin  1,000 mg Intravenous Q24H   Continuous: . sodium chloride 75 mL/hr at 12/28/12 2224   NUU:[id number], clonazePAM, nitroGLYCERIN  Assesment: She has pneumonia following influenza. She's been treated as if this is staph and being treated for community-acquired pneumonia. She seems to be improving as far as the pneumonia is concerned but still has some delirium Principal Problem:   Pneumonia due to infectious agent Active Problems:   DIABETES MELLITUS, TYPE II, CONTROLLED   HYPERLIPIDEMIA   ANXIETY STATE NOS   DEPRESSION   COPD    Plan: Continue with current antibiotics    LOS: 3 days   Ivy Puryear L 12/29/2012, 8:59 AM

## 2012-12-30 ENCOUNTER — Inpatient Hospital Stay (HOSPITAL_COMMUNITY): Payer: Medicare Other

## 2012-12-30 LAB — GLUCOSE, CAPILLARY
Glucose-Capillary: 83 mg/dL (ref 70–99)
Glucose-Capillary: 139 mg/dL — ABNORMAL HIGH (ref 70–99)
Glucose-Capillary: 158 mg/dL — ABNORMAL HIGH (ref 70–99)

## 2012-12-30 LAB — CBC WITH DIFFERENTIAL/PLATELET
Lymphocytes Relative: 7 % — ABNORMAL LOW (ref 12–46)
Lymphs Abs: 0.7 10*3/uL (ref 0.7–4.0)
MCH: 28.1 pg (ref 26.0–34.0)
MCHC: 32.4 g/dL (ref 30.0–36.0)
Monocytes Absolute: 0.8 10*3/uL (ref 0.1–1.0)
Monocytes Relative: 7 % (ref 3–12)
Neutrophils Relative %: 86 % — ABNORMAL HIGH (ref 43–77)
Platelets: 288 10*3/uL (ref 150–400)
RBC: 3.34 MIL/uL — ABNORMAL LOW (ref 3.87–5.11)
RDW: 16.3 % — ABNORMAL HIGH (ref 11.5–15.5)

## 2012-12-30 MED ORDER — VANCOMYCIN HCL 10 G IV SOLR
1250.0000 mg | INTRAVENOUS | Status: DC
  Administered 2012-12-31 – 2013-01-08 (×9): 1250 mg via INTRAVENOUS
  Filled 2012-12-30 (×11): qty 1250

## 2012-12-30 MED ORDER — INSULIN GLARGINE 100 UNIT/ML ~~LOC~~ SOLN
35.0000 [IU] | Freq: Every day | SUBCUTANEOUS | Status: DC
  Administered 2012-12-30 – 2013-01-02 (×3): 35 [IU] via SUBCUTANEOUS
  Filled 2012-12-30 (×6): qty 0.35

## 2012-12-30 MED ORDER — HALOPERIDOL LACTATE 5 MG/ML IJ SOLN
1.0000 mg | Freq: Once | INTRAMUSCULAR | Status: AC
  Administered 2012-12-30: 1 mg via INTRAMUSCULAR
  Filled 2012-12-30: qty 1

## 2012-12-30 NOTE — Progress Notes (Signed)
[id number] 

## 2012-12-30 NOTE — Progress Notes (Signed)
Pt agitated, confused, pulled out IV, will not stay in the bed or chair, yelling out, pushing staff, MD notified, Haldol 47m IM given per one time orders.

## 2012-12-30 NOTE — Progress Notes (Signed)
NAMEJEYLI, ZWICKER NO.:  [id number]  MEDICAL RECORD NO.:  [id number]  LOCATION:  O294                          FACILITY:  [facility]  PHYSICIAN:  Unk Lightning, MDDATE OF BIRTH:  Aug 02, 1951  DATE OF PROCEDURE: DATE OF DISCHARGE:                                PROGRESS NOTE   PROBLEM:  Multilobar pneumonia, on triple antibiotics, COPD, major depression, anxiety, chronic noncompliance, anemia and now a diagnosis of iron deficiency, Hemoccult stools still pending, chronic renal failure, creatinine of 1.64, insulin-dependent diabetes.  The patient has a mild to moderate end expiratory and inspiratory wheezing still on Solu-Medrol with DuoNeb nebulizers.  Blood pressure 127/76, temperature 97.4, respiratory rate 22, pulse 83 and regular.  Hemoglobin down to 9.4, partially dilutional.  Iron less than 10. Ferritin elevated, but is acute phase reactant.  HEART:  Regular rhythm.  No S3-S4.  No heaves, thrills, or rubs.  PLAN:  Right now is to add Feosol 325 p.o. b.i.d.  Continue to check stools for occult blood.  We look and see if she has had a colonoscopy. She is non reliable historian.  At present, continue vanc, Rocephin, and Zithromax.     Unk Lightning, MD     RMD/MEDQ  D:  12/29/2012  T:  12/30/2012  Job:  [id number]

## 2012-12-30 NOTE — Progress Notes (Signed)
ANTIBIOTIC CONSULT NOTE  Pharmacy Consult for Vancomycin Indication: rule out pneumonia  Allergies  Allergen Reactions  . Ciprofloxacin     REACTION: Rash  . Iohexol      Desc: CHEST TIGHTNESS,BRETHING PROBLEMS NEEDS PRE MEDS   . Neomycin-Bacitracin Zn-Polymyx     REACTION: Rash  . Penicillins     REACTION: Rash  . Povidone     REACTION: Rash   Patient Measurements: Height: 5' 6"  (167.6 cm) Weight: 174 lb 6.4 oz (79.107 kg) IBW/kg (Calculated) : 59.3  Vital Signs: Temp: 97.5 F (36.4 C) (03/18 0942) Temp src: Oral (03/18 0942) BP: 141/90 mmHg (03/18 0942) Pulse Rate: 108 (03/18 0942) Intake/Output from previous day: 03/17 0701 - 03/18 0700 In: 130 [P.O.:130] Out: 500 [Urine:500] Intake/Output from this shift:    Labs:  Recent Labs  12/28/12 0634 12/28/12 0711 12/29/12 0514 12/30/12 1103  WBC  --  6.4  --  10.5  HGB  --  9.4*  --  9.4*  PLT  --  218  --  288  CREATININE 1.61*  --  1.64*  --    Estimated Creatinine Clearance: 38.2 ml/min (by C-G formula based on Cr of 1.64).  Recent Labs  12/30/12 1103  Lyman 13.0    Microbiology: Recent Results (from the past 720 hour(s))  URINE CULTURE     Status: None   Collection Time    12/26/12  2:37 PM      Result Value Range Status   Specimen Description URINE, CATHETERIZED   Final   Special Requests NONE   Final   Culture  Setup Time 12/27/2012 05:01   Final   Colony Count NO GROWTH   Final   Culture NO GROWTH   Final   Report Status 12/28/2012 FINAL   Final  CULTURE, BLOOD (ROUTINE X 2)     Status: None   Collection Time    12/26/12  2:55 PM      Result Value Range Status   Specimen Description BLOOD LEFT ARM   Final   Special Requests BOTTLES DRAWN AEROBIC ONLY 6CC   Final   Culture NO GROWTH 3 DAYS   Final   Report Status PENDING   Incomplete  CULTURE, BLOOD (ROUTINE X 2)     Status: None   Collection Time    12/26/12  3:10 PM      Result Value Range Status   Specimen Description BLOOD  RIGHT ARM   Final   Special Requests BOTTLES DRAWN AEROBIC ONLY 6CC   Final   Culture NO GROWTH 3 DAYS   Final   Report Status PENDING   Incomplete   Medical History: Past Medical History  Diagnosis Date  . Diabetes mellitus   . COPD (chronic obstructive pulmonary disease)   . ASCVD (arteriosclerotic cardiovascular disease)     MI in 96 requiring BMS CX; DES to M1 in 2000;normal coronary angiography in 2004  . Hyperlipidemia   . Hypothyroidism   . Tobacco abuse   . Depression   . DVT (deep venous thrombosis)   . Nephrolithiasis 2006    stone extraction   . Amputation of hand, right     traumatic  . Allergic rhinitis   . Cholelithiasis   . GERD (gastroesophageal reflux disease)   . Low back pain   . Peripheral neuropathy   . Diabetes mellitus type I   . DDD (degenerative disc disease), lumbar   . Sciatic pain     right  . Tremor  Medications:  Scheduled:  . ipratropium  0.5 mg Nebulization Q4H   And  . albuterol  2.5 mg Nebulization Q4H  . ARIPiprazole  5 mg Oral Daily  . aspirin  81 mg Oral Daily  . azithromycin  500 mg Oral q1800  . benztropine  0.5 mg Oral Daily  . cefTRIAXone (ROCEPHIN)  IV  1 g Intravenous Q24H  . colesevelam  1,875 mg Oral BID WC  . DULoxetine  40 mg Oral BID  . enoxaparin (LOVENOX) injection  40 mg Subcutaneous Daily  . glyBURIDE  5 mg Oral BID AC  . insulin aspart  12 Units Subcutaneous TID WC  . insulin glargine  35 Units Subcutaneous QHS  . levothyroxine  200 mcg Oral QAC breakfast  . lisinopril  20 mg Oral Daily  . methylPREDNISolone sodium succinate  125 mg Intravenous Q8H  . metoprolol  25 mg Oral BID  . niacin  1,000 mg Oral QHS  . omega-3 acid ethyl esters  2 g Oral Daily  . oseltamivir  75 mg Oral BID  . pantoprazole  80 mg Oral Daily  . simvastatin  20 mg Oral q1800  . traZODone  100 mg Oral QHS  . vancomycin  1,000 mg Intravenous Q24H  . [DISCONTINUED] albuterol  2.5 mg Nebulization TID  . [DISCONTINUED] ferrous sulfate   325 mg Oral BID WC  . [DISCONTINUED] insulin glargine  35 Units Subcutaneous QHS  . [DISCONTINUED] ipratropium  0.5 mg Nebulization TID   Assessment: Okay for Protocol Estimated Creatinine Clearance: 38.2 ml/min (by C-G formula based on Cr of 1.64).   Pt is afebrile Patient also receiving Rocephin and Zithromax for suspected PNA. Trough level is below goal.  Goal of Therapy:  Vancomycin trough level 15-20 mcg/ml  Plan:  Increase Vancomycin to 1265m IV every 24 hours. Measure antibiotic drug levels at steady state Follow up culture results  HHart RobinsonsA 12/30/2012,12:31 PM

## 2012-12-30 NOTE — Progress Notes (Signed)
Subjective: She is a little bit better. She is a little bit less agitated.  Objective: Vital signs in last 24 hours: Temp:  [97.3 F (36.3 C)] 97.3 F (36.3 C) (03/17 2353) Pulse Rate:  [79] 79 (03/17 2353) Resp:  [20] 20 (03/17 2353) BP: (143)/(79) 143/79 mmHg (03/17 2353) SpO2:  [91 %-93 %] 91 % (03/17 2353) Weight change:  Last BM Date:  (pt unsure)  Intake/Output from previous day: 03/17 0701 - 03/18 0700 In: 130 [P.O.:130] Out: 500 [Urine:500]  PHYSICAL EXAM General appearance: alert, cooperative and mild distress Resp: rhonchi bilaterally Cardio: regular rate and rhythm, S1, S2 normal, no murmur, click, rub or gallop GI: soft, non-tender; bowel sounds normal; no masses,  no organomegaly 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  Lab Results:    Basic Metabolic Panel:  Recent Labs  12/28/12 0634 12/29/12 0514  NA 133* 136  K 4.6 4.3  CL 101 104  CO2 19 18*  GLUCOSE 198* 87  BUN 50* 48*  CREATININE 1.61* 1.64*  CALCIUM 8.5 8.5   Liver Function Tests:  Recent Labs  12/28/12 0634 12/29/12 0514  AST 13 17  ALT 9 10  ALKPHOS 95 95  BILITOT 0.2* 0.2*  PROT 6.7 6.6  ALBUMIN 2.0* 2.1*   No results found for this basename: LIPASE, AMYLASE,  in the last 72 hours No results found for this basename: AMMONIA,  in the last 72 hours CBC:  Recent Labs  12/28/12 0711  WBC 6.4  NEUTROABS 4.9  HGB 9.4*  HCT 28.4*  MCV 86.1  PLT 218   Cardiac Enzymes: No results found for this basename: CKTOTAL, CKMB, CKMBINDEX, TROPONINI,  in the last 72 hours BNP: No results found for this basename: PROBNP,  in the last 72 hours D-Dimer: No results found for this basename: DDIMER,  in the last 72 hours CBG:  Recent Labs  12/28/12 2016 12/29/12 0803 12/29/12 1209 12/29/12 1715 12/29/12 2038 12/30/12 0756  GLUCAP 110* 86 91 73 83 79   Hemoglobin A1C: No results found for this basename: HGBA1C,  in the last 72 hours Fasting Lipid Panel: No results  found for this basename: CHOL, HDL, LDLCALC, TRIG, CHOLHDL, LDLDIRECT,  in the last 72 hours Thyroid Function Tests: No results found for this basename: TSH, T4TOTAL, FREET4, T3FREE, THYROIDAB,  in the last 72 hours Anemia Panel:  Recent Labs  12/27/12 1200  VITAMINB12 786  FOLATE 5.2  FERRITIN 118  TIBC Not calculated due to Iron <10.  IRON <10*  RETICCTPCT 0.6   Coagulation: No results found for this basename: LABPROT, INR,  in the last 72 hours Urine Drug Screen: Drugs of Abuse  No results found for this basename: labopia, cocainscrnur, labbenz, amphetmu, thcu, labbarb    Alcohol Level: No results found for this basename: ETH,  in the last 72 hours Urinalysis: No results found for this basename: COLORURINE, APPERANCEUR, LABSPEC, PHURINE, GLUCOSEU, HGBUR, BILIRUBINUR, KETONESUR, PROTEINUR, UROBILINOGEN, NITRITE, LEUKOCYTESUR,  in the last 72 hours Misc. Labs:  ABGS No results found for this basename: PHART, PCO2, PO2ART, TCO2, HCO3,  in the last 72 hours CULTURES Recent Results (from the past 240 hour(s))  URINE CULTURE     Status: None   Collection Time    12/26/12  2:37 PM      Result Value Range Status   Specimen Description URINE, CATHETERIZED   Final   Special Requests NONE   Final   Culture  Setup Time 12/27/2012 05:01  Final   Colony Count NO GROWTH   Final   Culture NO GROWTH   Final   Report Status 12/28/2012 FINAL   Final  CULTURE, BLOOD (ROUTINE X 2)     Status: None   Collection Time    12/26/12  2:55 PM      Result Value Range Status   Specimen Description BLOOD LEFT ARM   Final   Special Requests BOTTLES DRAWN AEROBIC ONLY 6CC   Final   Culture NO GROWTH 3 DAYS   Final   Report Status PENDING   Incomplete  CULTURE, BLOOD (ROUTINE X 2)     Status: None   Collection Time    12/26/12  3:10 PM      Result Value Range Status   Specimen Description BLOOD RIGHT ARM   Final   Special Requests BOTTLES DRAWN AEROBIC ONLY 6CC   Final   Culture NO GROWTH 3  DAYS   Final   Report Status PENDING   Incomplete   Studies/Results: No results found.  Medications:  Prior to Admission:  Prescriptions prior to admission  Medication Sig Dispense Refill  . ARIPiprazole (ABILIFY) 5 MG tablet Take 1 tablet (5 mg total) by mouth daily.  30 tablet  1  . aspirin 81 MG chewable tablet Chew 81 mg by mouth daily.      . beclomethasone (QVAR) 40 MCG/ACT inhaler Inhale 2 puffs into the lungs 2 (two) times daily.        . benztropine (COGENTIN) 0.5 MG tablet TAKE 1 TABLET (0.5 MG TOTAL)  BY MOUTH DAILY.  30 tablet  0  . clindamycin (CLEOCIN) 300 MG capsule Take 300 mg by mouth 3 (three) times daily.       . clonazePAM (KLONOPIN) 0.5 MG tablet Take 0.5-1 mg by mouth 2 (two) times daily. Take 1 in am and 2 at bed time      . colesevelam (WELCHOL) 625 MG tablet Take 1,875 mg by mouth 2 (two) times daily with a meal.        . DULoxetine (CYMBALTA) 20 MG capsule Take 2 capsules (40 mg total) by mouth 2 (two) times daily.  120 capsule  1  . esomeprazole (NEXIUM) 40 MG capsule Take 40 mg by mouth daily before breakfast.        . fish oil-omega-3 fatty acids 1000 MG capsule Take 2 g by mouth daily.        Marland Kitchen gabapentin (NEURONTIN) 100 MG capsule Take 1 capsule by mouth 3 (three) times daily.      Marland Kitchen glyBURIDE (DIABETA) 5 MG tablet Take 5 mg by mouth 2 (two) times daily.       Marland Kitchen levothyroxine (SYNTHROID, LEVOTHROID) 200 MCG tablet Take 400 mcg by mouth daily.      Marland Kitchen lisinopril (PRINIVIL,ZESTRIL) 20 MG tablet Take 20 mg by mouth daily.        . metFORMIN (GLUCOPHAGE) 500 MG tablet Take 500 mg by mouth 2 (two) times daily.      . metoprolol (LOPRESSOR) 50 MG tablet Take 25 mg by mouth 2 (two) times daily.       . naproxen (NAPROSYN) 500 MG tablet Take 500 mg by mouth 2 (two) times daily.       . niacin (NIASPAN) 1000 MG CR tablet Take 1,000 mg by mouth at bedtime.        . nitroGLYCERIN (NITROSTAT) 0.4 MG SL tablet Place 0.4 mg under the tongue every 5 (five) minutes as  needed.  Chest pain      . pravastatin (PRAVACHOL) 40 MG tablet Take 40 mg by mouth daily.        . traZODone (DESYREL) 100 MG tablet Take 1 tablet (100 mg total) by mouth at bedtime.  30 tablet  1   Scheduled: . ipratropium  0.5 mg Nebulization Q4H   And  . albuterol  2.5 mg Nebulization Q4H  . ARIPiprazole  5 mg Oral Daily  . aspirin  81 mg Oral Daily  . azithromycin  500 mg Oral q1800  . benztropine  0.5 mg Oral Daily  . cefTRIAXone (ROCEPHIN)  IV  1 g Intravenous Q24H  . colesevelam  1,875 mg Oral BID WC  . DULoxetine  40 mg Oral BID  . enoxaparin (LOVENOX) injection  40 mg Subcutaneous Daily  . glyBURIDE  5 mg Oral BID AC  . insulin aspart  12 Units Subcutaneous TID WC  . insulin glargine  35 Units Subcutaneous QHS  . levothyroxine  200 mcg Oral QAC breakfast  . lisinopril  20 mg Oral Daily  . methylPREDNISolone sodium succinate  125 mg Intravenous Q8H  . metoprolol  25 mg Oral BID  . niacin  1,000 mg Oral QHS  . omega-3 acid ethyl esters  2 g Oral Daily  . oseltamivir  75 mg Oral BID  . pantoprazole  80 mg Oral Daily  . simvastatin  20 mg Oral q1800  . traZODone  100 mg Oral QHS  . vancomycin  1,000 mg Intravenous Q24H   Continuous: . sodium chloride 75 mL/hr at 12/30/12 0543   JOI:NOMVEHMCN, clonazePAM, nitroGLYCERIN  Assesment: She has post influenza pneumonia and seems to be slowly improving. Principal Problem:   Pneumonia due to infectious agent Active Problems:   DIABETES MELLITUS, TYPE II, CONTROLLED   HYPERLIPIDEMIA   ANXIETY STATE NOS   DEPRESSION   COPD    Plan: Continue current antibiotics et Ronney Asters.    LOS: 4 days   Rickey Sadowski L 12/30/2012, 8:54 AM

## 2012-12-31 ENCOUNTER — Inpatient Hospital Stay (HOSPITAL_COMMUNITY): Payer: Medicare Other

## 2012-12-31 LAB — BASIC METABOLIC PANEL
BUN: 33 mg/dL — ABNORMAL HIGH (ref 6–23)
CO2: 20 mEq/L (ref 19–32)
CO2: 20 mEq/L (ref 19–32)
Calcium: 8.5 mg/dL (ref 8.4–10.5)
Chloride: 111 mEq/L (ref 96–112)
Chloride: 111 mEq/L (ref 96–112)
Creatinine, Ser: 1.1 mg/dL (ref 0.50–1.10)
GFR calc Af Amer: 54 mL/min — ABNORMAL LOW (ref >90)
Glucose, Bld: 125 mg/dL — ABNORMAL HIGH (ref 70–99)
Sodium: 141 mEq/L (ref 135–145)

## 2012-12-31 LAB — CBC WITH DIFFERENTIAL/PLATELET
Eosinophils Relative: 0 % (ref 0–5)
HCT: 28.2 % — ABNORMAL LOW (ref 36.0–46.0)
Hemoglobin: 9.3 g/dL — ABNORMAL LOW (ref 12.0–15.0)
Lymphocytes Relative: 5 % — ABNORMAL LOW (ref 12–46)
MCV: 86.2 fL (ref 78.0–100.0)
Monocytes Absolute: 0.5 10*3/uL (ref 0.1–1.0)
Monocytes Relative: 4 % (ref 3–12)
Neutro Abs: 11.9 10*3/uL — ABNORMAL HIGH (ref 1.7–7.7)
WBC: 13.1 10*3/uL — ABNORMAL HIGH (ref 4.0–10.5)

## 2012-12-31 LAB — GLUCOSE, CAPILLARY
Glucose-Capillary: 171 mg/dL — ABNORMAL HIGH (ref 70–99)
Glucose-Capillary: 137 mg/dL — ABNORMAL HIGH (ref 70–99)
Glucose-Capillary: 101 mg/dL — ABNORMAL HIGH (ref 70–99)

## 2012-12-31 LAB — CULTURE, BLOOD (ROUTINE X 2): Culture: NO GROWTH

## 2012-12-31 LAB — BLOOD GAS, ARTERIAL
Bicarbonate: 20.6 mEq/L (ref 20.0–24.0)
Drawn by: 22223
FIO2: 100 %
FIO2: 50 %
O2 Saturation: 99.3 %
Patient temperature: 37
TCO2: 19.1 mmol/L (ref 0–100)
pCO2 arterial: 50.2 mmHg — ABNORMAL HIGH (ref 35.0–45.0)
pCO2 arterial: 43 mmHg (ref 35.0–45.0)
pH, Arterial: 7.304 — ABNORMAL LOW (ref 7.350–7.450)
pO2, Arterial: 269 mmHg — ABNORMAL HIGH (ref 80.0–100.0)

## 2012-12-31 LAB — LACTIC ACID, PLASMA: Lactic Acid, Venous: 0.9 mmol/L (ref 0.5–2.2)

## 2012-12-31 LAB — MRSA PCR SCREENING: MRSA by PCR: NEGATIVE

## 2012-12-31 MED ORDER — INSULIN ASPART 100 UNIT/ML ~~LOC~~ SOLN
0.0000 [IU] | Freq: Three times a day (TID) | SUBCUTANEOUS | Status: DC
  Administered 2013-01-01 – 2013-01-03 (×5): 1 [IU] via SUBCUTANEOUS
  Administered 2013-01-04: 2 [IU] via SUBCUTANEOUS
  Administered 2013-01-05 (×2): 1 [IU] via SUBCUTANEOUS
  Administered 2013-01-06: 3 [IU] via SUBCUTANEOUS
  Administered 2013-01-06: 2 [IU] via SUBCUTANEOUS
  Administered 2013-01-06: 3 [IU] via SUBCUTANEOUS
  Administered 2013-01-07 (×2): 2 [IU] via SUBCUTANEOUS
  Administered 2013-01-07: 3 [IU] via SUBCUTANEOUS
  Administered 2013-01-08: 1 [IU] via SUBCUTANEOUS

## 2012-12-31 MED ORDER — DEXTROSE 5 % IV SOLN
INTRAVENOUS | Status: AC
  Filled 2012-12-31: qty 2

## 2012-12-31 MED ORDER — LORAZEPAM 2 MG/ML IJ SOLN
INTRAMUSCULAR | Status: AC
  Administered 2012-12-31: 1 mg
  Filled 2012-12-31: qty 1

## 2012-12-31 MED ORDER — DEXTROSE 5 % IV SOLN
2.0000 g | Freq: Two times a day (BID) | INTRAVENOUS | Status: DC
  Administered 2012-12-31 – 2013-01-08 (×16): 2 g via INTRAVENOUS
  Filled 2012-12-31 (×20): qty 2

## 2012-12-31 MED ORDER — DEXTROSE 5 % IV SOLN
500.0000 mg | INTRAVENOUS | Status: DC
  Administered 2012-12-31 – 2013-01-08 (×8): 500 mg via INTRAVENOUS
  Filled 2012-12-31 (×9): qty 500

## 2012-12-31 MED ORDER — HALOPERIDOL LACTATE 5 MG/ML IJ SOLN
5.0000 mg | Freq: Four times a day (QID) | INTRAMUSCULAR | Status: DC | PRN
  Administered 2012-12-31 – 2013-01-04 (×14): 5 mg via INTRAVENOUS
  Filled 2012-12-31 (×14): qty 1

## 2012-12-31 MED ORDER — DEXTROSE 5 % IV SOLN
INTRAVENOUS | Status: AC
  Filled 2012-12-31: qty 500

## 2012-12-31 MED ORDER — LORAZEPAM 2 MG/ML IJ SOLN
1.0000 mg | INTRAMUSCULAR | Status: DC | PRN
  Administered 2013-01-01 – 2013-01-07 (×26): 1 mg via INTRAVENOUS
  Filled 2012-12-31 (×26): qty 1

## 2012-12-31 NOTE — Progress Notes (Signed)
Patient's skin color bluish-colored, agitated, trying to get OOB, O2 @ 5L/M via [place] on, confused, O2 sats 65-74%, 100% NRB mask applied, color pink, O2 sats increased to 96-98%, patient calmer at present.

## 2012-12-31 NOTE — Progress Notes (Signed)
UR Chart Review Completed  

## 2012-12-31 NOTE — Progress Notes (Signed)
Subjective: I think she's overall about the same. She has pneumonia post influenza and is being treated with vancomycin Rocephin and Zithromax. She seems to be improving from a respiratory point of view but is still somewhat delirious  Objective: Vital signs in last 24 hours: Temp:  [97.2 F (36.2 C)-98.7 F (37.1 C)] 97.3 F (36.3 C) (03/19 0637) Pulse Rate:  [62-108] 71 (03/19 0637) Resp:  [20-32] 20 (03/19 0637) BP: (115-163)/(65-90) 163/79 mmHg (03/19 0637) SpO2:  [91 %-100 %] 92 % (03/19 0637) Weight change:  Last BM Date:  (pt unsure)  Intake/Output from previous day: 03/18 0701 - 03/19 0700 In: 1048.8 [P.O.:240; I.V.:758.8; IV Piggyback:50] Out: 1000 [Urine:1000]  PHYSICAL EXAM General appearance: alert and moderate distress Resp: rhonchi bilaterally Cardio: regular rate and rhythm, S1, S2 normal, no murmur, click, rub or gallop GI: soft, non-tender; bowel sounds normal; no masses,  no organomegaly 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  Lab Results:    Basic Metabolic Panel:  Recent Labs  12/29/12 0514 12/31/12 0515  NA 136 141  K 4.3 4.4  CL 104 111  CO2 18* 20  GLUCOSE 87 208*  BUN 48* 37*  CREATININE 1.64* 1.23*  CALCIUM 8.5 8.5   Liver Function Tests:  Recent Labs  12/29/12 0514  AST 17  ALT 10  ALKPHOS 95  BILITOT 0.2*  PROT 6.6  ALBUMIN 2.1*   No results found for this basename: LIPASE, AMYLASE,  in the last 72 hours No results found for this basename: AMMONIA,  in the last 72 hours CBC:  Recent Labs  12/30/12 1103  WBC 10.5  NEUTROABS 9.1*  HGB 9.4*  HCT 29.0*  MCV 86.8  PLT 288   Cardiac Enzymes: No results found for this basename: CKTOTAL, CKMB, CKMBINDEX, TROPONINI,  in the last 72 hours BNP: No results found for this basename: PROBNP,  in the last 72 hours D-Dimer: No results found for this basename: DDIMER,  in the last 72 hours CBG:  Recent Labs  12/29/12 2038 12/30/12 0756 12/30/12 1120  12/30/12 1607 12/30/12 2047 12/31/12 0709  GLUCAP 83 79 83 139* 158* 171*   Hemoglobin A1C: No results found for this basename: HGBA1C,  in the last 72 hours Fasting Lipid Panel: No results found for this basename: CHOL, HDL, LDLCALC, TRIG, CHOLHDL, LDLDIRECT,  in the last 72 hours Thyroid Function Tests: No results found for this basename: TSH, T4TOTAL, FREET4, T3FREE, THYROIDAB,  in the last 72 hours Anemia Panel: No results found for this basename: VITAMINB12, FOLATE, FERRITIN, TIBC, IRON, RETICCTPCT,  in the last 72 hours Coagulation: No results found for this basename: LABPROT, INR,  in the last 72 hours Urine Drug Screen: Drugs of Abuse  No results found for this basename: labopia, cocainscrnur, labbenz, amphetmu, thcu, labbarb    Alcohol Level: No results found for this basename: ETH,  in the last 72 hours Urinalysis: No results found for this basename: COLORURINE, APPERANCEUR, LABSPEC, PHURINE, GLUCOSEU, HGBUR, BILIRUBINUR, KETONESUR, PROTEINUR, UROBILINOGEN, NITRITE, LEUKOCYTESUR,  in the last 72 hours Misc. Labs:  ABGS No results found for this basename: PHART, PCO2, PO2ART, TCO2, HCO3,  in the last 72 hours CULTURES Recent Results (from the past 240 hour(s))  URINE CULTURE     Status: None   Collection Time    12/26/12  2:37 PM      Result Value Range Status   Specimen Description URINE, CATHETERIZED   Final   Special Requests NONE   Final  Culture  Setup Time 12/27/2012 05:01   Final   Colony Count NO GROWTH   Final   Culture NO GROWTH   Final   Report Status 12/28/2012 FINAL   Final  CULTURE, BLOOD (ROUTINE X 2)     Status: None   Collection Time    12/26/12  2:55 PM      Result Value Range Status   Specimen Description BLOOD LEFT ARM   Final   Special Requests BOTTLES DRAWN AEROBIC ONLY 6CC   Final   Culture NO GROWTH 3 DAYS   Final   Report Status PENDING   Incomplete  CULTURE, BLOOD (ROUTINE X 2)     Status: None   Collection Time    12/26/12  3:10  PM      Result Value Range Status   Specimen Description BLOOD RIGHT ARM   Final   Special Requests BOTTLES DRAWN AEROBIC ONLY 6CC   Final   Culture NO GROWTH 3 DAYS   Final   Report Status PENDING   Incomplete   Studies/Results: Dg Chest 2 View  12/30/2012  *RADIOLOGY REPORT*  Clinical Data: Increased shortness of breath.  Confusion.  CHEST - 2 VIEW  Comparison: 12/26/2012.  Findings: Mildly progressive patchy opacity throughout both lungs. Borderline enlarged cardiac silhouette.  Thoracic spine degenerative changes.  No pleural fluid.  IMPRESSION: Progressive extensive bilateral pneumonia.   Original Report Authenticated By: Claudie Revering, M.D.     Medications:  Prior to Admission:  Prescriptions prior to admission  Medication Sig Dispense Refill  . ARIPiprazole (ABILIFY) 5 MG tablet Take 1 tablet (5 mg total) by mouth daily.  30 tablet  1  . aspirin 81 MG chewable tablet Chew 81 mg by mouth daily.      . beclomethasone (QVAR) 40 MCG/ACT inhaler Inhale 2 puffs into the lungs 2 (two) times daily.        . benztropine (COGENTIN) 0.5 MG tablet TAKE 1 TABLET (0.5 MG TOTAL)  BY MOUTH DAILY.  30 tablet  0  . clindamycin (CLEOCIN) 300 MG capsule Take 300 mg by mouth 3 (three) times daily.       . clonazePAM (KLONOPIN) 0.5 MG tablet Take 0.5-1 mg by mouth 2 (two) times daily. Take 1 in am and 2 at bed time      . colesevelam (WELCHOL) 625 MG tablet Take 1,875 mg by mouth 2 (two) times daily with a meal.        . DULoxetine (CYMBALTA) 20 MG capsule Take 2 capsules (40 mg total) by mouth 2 (two) times daily.  120 capsule  1  . esomeprazole (NEXIUM) 40 MG capsule Take 40 mg by mouth daily before breakfast.        . fish oil-omega-3 fatty acids 1000 MG capsule Take 2 g by mouth daily.        Marland Kitchen gabapentin (NEURONTIN) 100 MG capsule Take 1 capsule by mouth 3 (three) times daily.      Marland Kitchen glyBURIDE (DIABETA) 5 MG tablet Take 5 mg by mouth 2 (two) times daily.       Marland Kitchen levothyroxine (SYNTHROID, LEVOTHROID)  200 MCG tablet Take 400 mcg by mouth daily.      Marland Kitchen lisinopril (PRINIVIL,ZESTRIL) 20 MG tablet Take 20 mg by mouth daily.        . metFORMIN (GLUCOPHAGE) 500 MG tablet Take 500 mg by mouth 2 (two) times daily.      . metoprolol (LOPRESSOR) 50 MG tablet Take 25 mg by mouth  2 (two) times daily.       . naproxen (NAPROSYN) 500 MG tablet Take 500 mg by mouth 2 (two) times daily.       . niacin (NIASPAN) 1000 MG CR tablet Take 1,000 mg by mouth at bedtime.        . nitroGLYCERIN (NITROSTAT) 0.4 MG SL tablet Place 0.4 mg under the tongue every 5 (five) minutes as needed. Chest pain      . pravastatin (PRAVACHOL) 40 MG tablet Take 40 mg by mouth daily.        . traZODone (DESYREL) 100 MG tablet Take 1 tablet (100 mg total) by mouth at bedtime.  30 tablet  1   Scheduled: . ipratropium  0.5 mg Nebulization Q4H   And  . albuterol  2.5 mg Nebulization Q4H  . ARIPiprazole  5 mg Oral Daily  . aspirin  81 mg Oral Daily  . azithromycin  500 mg Oral q1800  . benztropine  0.5 mg Oral Daily  . cefTRIAXone (ROCEPHIN)  IV  1 g Intravenous Q24H  . colesevelam  1,875 mg Oral BID WC  . DULoxetine  40 mg Oral BID  . enoxaparin (LOVENOX) injection  40 mg Subcutaneous Daily  . glyBURIDE  5 mg Oral BID AC  . insulin aspart  12 Units Subcutaneous TID WC  . insulin glargine  35 Units Subcutaneous QHS  . levothyroxine  200 mcg Oral QAC breakfast  . lisinopril  20 mg Oral Daily  . methylPREDNISolone sodium succinate  125 mg Intravenous Q8H  . metoprolol  25 mg Oral BID  . niacin  1,000 mg Oral QHS  . omega-3 acid ethyl esters  2 g Oral Daily  . oseltamivir  75 mg Oral BID  . pantoprazole  80 mg Oral Daily  . simvastatin  20 mg Oral q1800  . traZODone  100 mg Oral QHS  . vancomycin  1,250 mg Intravenous Q24H   Continuous: . sodium chloride 75 mL/hr at 12/30/12 2053   YKD:[id number], clonazePAM, nitroGLYCERIN  Assesment: She has multilobar pneumonia with associated delirium she does seem to be  improving Principal Problem:   Pneumonia due to infectious agent Active Problems:   DIABETES MELLITUS, TYPE II, CONTROLLED   HYPERLIPIDEMIA   ANXIETY STATE NOS   DEPRESSION   COPD    Plan: Chest x-ray today    LOS: 5 days   Zorana Brockwell L 12/31/2012, 8:46 AM

## 2012-12-31 NOTE — Progress Notes (Signed)
Patient pulling off venti mask and will not keep on.  Placed on 6L [place] at this time and attempting to feed patient some dinner.  MD notified, will be coming to assess patient this evening.  Patient not responding appropriately when spoken to and will not take meds at this time

## 2012-12-31 NOTE — Progress Notes (Signed)
[id number] 

## 2012-12-31 NOTE — Progress Notes (Signed)
Patient BIPAP on standby after several attempts made previously unable to wear. Patient remains on NREBRE and will be montiored for any changes upon which she will be placed on a BIPAP.

## 2012-12-31 NOTE — Progress Notes (Signed)
[name], SKUFCA NO.:  [id number]  MEDICAL RECORD NO.:  [id number]  LOCATION:  X324                          FACILITY:  [facility]  PHYSICIAN:  Unk Lightning, MDDATE OF BIRTH:  1951-07-20  DATE OF PROCEDURE: DATE OF DISCHARGE:                                PROGRESS NOTE   The patient has post influenza pneumonia, possible superimposed bacterial pneumonia, multi lobar, on 3 antibiotics, vanc, Rocephin, and Zithromax.  Insulin-dependent diabetes; benign essential tremor; iron deficiency with associated anemia, hemoglobin of 9.4; chronic renal failure secondary to long-standing uncontrolled diabetes with a creatinine of 1.64; major depression; anxiety; COPD; hyperlipidemia. Blood pressure 141/90, pulse 108 and regular, respiratory rate is 32, temperature 97.5, O2 sat is 91%.  WBC is 10.5, hemoglobin 9.4 and stable, creatinine 1.64.  Lungs show diminished breath sounds at the bases.  Scattered rhonchi throughout all lung fields.  Mild-to-moderate expiratory and inspiratory wheezing, no rales audible.  Heart rhythm, no S3 or S4.  No heaves, thrills, or rubs.  Abdomen:  Soft, nontender. Bowel sounds normoactive.  No guarding, rebound, mass, or organomegaly.  PLAN:  Right now is to continue the triple antibiotics, DuoNeb nebulizer, Solu-Medrol as it is.  Continue Tamiflu 75 b.i.d.  We will consider chest x-ray per Pulmonary if they deem clinically relevant and continue insulin.  She has good glycemic control.     Unk Lightning, MD     RMD/MEDQ  D:  12/30/2012  T:  12/31/2012  Job:  [id number]

## 2012-12-31 NOTE — Plan of Care (Signed)
Problem: Phase II Progression Outcomes Goal: Progress activity as tolerated unless otherwise ordered Outcome: Completed/Met Date Met:  12/31/12 Patient ambulates around room and gets OOB to chair and bathroom

## 2012-12-31 NOTE — Progress Notes (Addendum)
Patient being transferred to ICU.  Report called and given to [name], Therapist, sports.  Patient belonging and meds sent with patient.  Patient wearing 6L O2 - will not keep venti mask on.  Patient given ativan earlier, but does not appear to be any less agitated at this moment.  Husband called and updated on patient status and change of rooms.

## 2013-01-01 ENCOUNTER — Inpatient Hospital Stay (HOSPITAL_COMMUNITY): Payer: Medicare Other

## 2013-01-01 DIAGNOSIS — I509 Heart failure, unspecified: Secondary | ICD-10-CM

## 2013-01-01 LAB — BLOOD GAS, ARTERIAL
Acid-base deficit: 4.4 mmol/L — ABNORMAL HIGH (ref 0.0–2.0)
FIO2: 40 %
O2 Saturation: 93.5 %
TCO2: 20.3 mmol/L (ref 0–100)
pCO2 arterial: 46.8 mmHg — ABNORMAL HIGH (ref 35.0–45.0)
pO2, Arterial: 74 mmHg — ABNORMAL LOW (ref 80.0–100.0)

## 2013-01-01 LAB — CBC WITH DIFFERENTIAL/PLATELET
Basophils Absolute: 0 10*3/uL (ref 0.0–0.1)
Basophils Relative: 0 % (ref 0–1)
Eosinophils Absolute: 0 10*3/uL (ref 0.0–0.7)
Hemoglobin: 9.2 g/dL — ABNORMAL LOW (ref 12.0–15.0)
MCH: 28.5 pg (ref 26.0–34.0)
MCHC: 32.5 g/dL (ref 30.0–36.0)
Monocytes Relative: 4 % (ref 3–12)
Neutro Abs: 8.8 10*3/uL — ABNORMAL HIGH (ref 1.7–7.7)
Neutrophils Relative %: 89 % — ABNORMAL HIGH (ref 43–77)
Platelets: 325 10*3/uL (ref 150–400)
RDW: 16.8 % — ABNORMAL HIGH (ref 11.5–15.5)

## 2013-01-01 LAB — BASIC METABOLIC PANEL
BUN: 30 mg/dL — ABNORMAL HIGH (ref 6–23)
Chloride: 111 mEq/L (ref 96–112)
GFR calc Af Amer: 63 mL/min — ABNORMAL LOW (ref >90)
GFR calc non Af Amer: 54 mL/min — ABNORMAL LOW (ref >90)
Potassium: 4.4 mEq/L (ref 3.5–5.1)
Sodium: 143 mEq/L (ref 135–145)

## 2013-01-01 LAB — GLUCOSE, CAPILLARY
Glucose-Capillary: 92 mg/dL (ref 70–99)
Glucose-Capillary: 153 mg/dL — ABNORMAL HIGH (ref 70–99)

## 2013-01-01 LAB — TROPONIN I: Troponin I: <0.3 ng/mL (ref <0.30)

## 2013-01-01 MED ORDER — FUROSEMIDE 10 MG/ML IJ SOLN
40.0000 mg | Freq: Every day | INTRAMUSCULAR | Status: DC
  Administered 2013-01-01 – 2013-01-08 (×8): 40 mg via INTRAVENOUS
  Filled 2013-01-01 (×8): qty 4

## 2013-01-01 MED ORDER — FUROSEMIDE 10 MG/ML IJ SOLN
INTRAMUSCULAR | Status: AC
  Filled 2013-01-01: qty 4

## 2013-01-01 MED ORDER — FUROSEMIDE 10 MG/ML IJ SOLN
40.0000 mg | Freq: Once | INTRAMUSCULAR | Status: AC
  Administered 2013-01-01: 40 mg via INTRAVENOUS

## 2013-01-01 MED ORDER — TRAMADOL HCL 50 MG PO TABS
50.0000 mg | ORAL_TABLET | Freq: Two times a day (BID) | ORAL | Status: DC | PRN
  Administered 2013-01-02 – 2013-01-05 (×6): 50 mg via ORAL
  Filled 2013-01-01 (×6): qty 1

## 2013-01-01 MED ORDER — FUROSEMIDE 10 MG/ML IJ SOLN: 40.0000 mg | Freq: Every day | INTRAMUSCULAR | Status: DC

## 2013-01-01 NOTE — Progress Notes (Signed)
[id number] 

## 2013-01-01 NOTE — Progress Notes (Signed)
Patient climbing out of bed taking off venti mask, hypoxic and nonrebreather applied saturation 77%, with non rebreather 100%. Patient resting at present

## 2013-01-01 NOTE — Progress Notes (Signed)
*  PRELIMINARY RESULTS* Echocardiogram 2D Echocardiogram has been performed.  Tera Partridge 01/01/2013, 11:46 AM

## 2013-01-01 NOTE — Progress Notes (Signed)
Subjective: She became much worse and was transferred to the intensive care unit last night. This morning when I saw her she was confused and not very responsive but she is better now.  Objective: Vital signs in last 24 hours: Temp:  [97.1 F (36.2 C)-98.5 F (36.9 C)] 98.5 F (36.9 C) (03/20 0800) Pulse Rate:  [68-118] 89 (03/20 0500) Resp:  [17-32] 17 (03/20 0500) BP: (120-186)/(50-98) 130/64 mmHg (03/20 0500) SpO2:  [74 %-100 %] 94 % (03/20 0500) FiO2 (%):  [50 %-100 %] 50 % (03/20 0400) Weight:  [82.3 kg (181 lb 7 oz)] 82.3 kg (181 lb 7 oz) (03/19 2015) Weight change:  Last BM Date: 12/30/12  Intake/Output from previous day: 03/19 0701 - 03/20 0700 In: 3223.8 [P.O.:390; I.V.:2533.8; IV Piggyback:300] Out: 2000 [Urine:2000]  PHYSICAL EXAM General appearance: alert, cooperative and mild distress Resp: rhonchi bilaterally Cardio: regular rate and rhythm, S1, S2 normal, no murmur, click, rub or gallop GI: soft, non-tender; bowel sounds normal; no masses,  no organomegaly 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  Lab Results:    Basic Metabolic Panel:  Recent Labs  12/31/12 0515 12/31/12 2033  NA 141 141  K 4.4 4.4  CL 111 111  CO2 20 20  GLUCOSE 208* 125*  BUN 37* 33*  CREATININE 1.23* 1.10  CALCIUM 8.5 8.5   Liver Function Tests: No results found for this basename: AST, ALT, ALKPHOS, BILITOT, PROT, ALBUMIN,  in the last 72 hours No results found for this basename: LIPASE, AMYLASE,  in the last 72 hours No results found for this basename: AMMONIA,  in the last 72 hours CBC:  Recent Labs  12/30/12 1103 12/31/12 2033  WBC 10.5 13.1*  NEUTROABS 9.1* 11.9*  HGB 9.4* 9.3*  HCT 29.0* 28.2*  MCV 86.8 86.2  PLT 288 315   Cardiac Enzymes:  Recent Labs  12/31/12 2102 01/01/13 0254  TROPONINI <0.30 <0.30   BNP: No results found for this basename: PROBNP,  in the last 72 hours D-Dimer: No results found for this basename: DDIMER,  in the last  72 hours CBG:  Recent Labs  12/31/12 0709 12/31/12 1148 12/31/12 1658 12/31/12 2122 01/01/13 0757 01/01/13 0814  GLUCAP 171* 137* 101* 117* 122* 136*   Hemoglobin A1C: No results found for this basename: HGBA1C,  in the last 72 hours Fasting Lipid Panel: No results found for this basename: CHOL, HDL, LDLCALC, TRIG, CHOLHDL, LDLDIRECT,  in the last 72 hours Thyroid Function Tests: No results found for this basename: TSH, T4TOTAL, FREET4, T3FREE, THYROIDAB,  in the last 72 hours Anemia Panel: No results found for this basename: VITAMINB12, FOLATE, FERRITIN, TIBC, IRON, RETICCTPCT,  in the last 72 hours Coagulation: No results found for this basename: LABPROT, INR,  in the last 72 hours Urine Drug Screen: Drugs of Abuse  No results found for this basename: labopia, cocainscrnur, labbenz, amphetmu, thcu, labbarb    Alcohol Level: No results found for this basename: ETH,  in the last 72 hours Urinalysis: No results found for this basename: COLORURINE, APPERANCEUR, LABSPEC, PHURINE, GLUCOSEU, HGBUR, BILIRUBINUR, KETONESUR, PROTEINUR, UROBILINOGEN, NITRITE, LEUKOCYTESUR,  in the last 72 hours Misc. Labs:  ABGS  Recent Labs  01/01/13 0450  PHART 7.279*  PO2ART 74.0*  TCO2 20.3  HCO3 21.2   CULTURES Recent Results (from the past 240 hour(s))  URINE CULTURE     Status: None   Collection Time    12/26/12  2:37 PM      Result Value  Range Status   Specimen Description URINE, CATHETERIZED   Final   Special Requests NONE   Final   Culture  Setup Time 12/27/2012 05:01   Final   Colony Count NO GROWTH   Final   Culture NO GROWTH   Final   Report Status 12/28/2012 FINAL   Final  CULTURE, BLOOD (ROUTINE X 2)     Status: None   Collection Time    12/26/12  2:55 PM      Result Value Range Status   Specimen Description BLOOD LEFT ARM   Final   Special Requests BOTTLES DRAWN AEROBIC ONLY 6CC   Final   Culture NO GROWTH 5 DAYS   Final   Report Status 12/31/2012 FINAL   Final   CULTURE, BLOOD (ROUTINE X 2)     Status: None   Collection Time    12/26/12  3:10 PM      Result Value Range Status   Specimen Description BLOOD RIGHT ARM   Final   Special Requests BOTTLES DRAWN AEROBIC ONLY 6CC   Final   Culture NO GROWTH 5 DAYS   Final   Report Status 12/31/2012 FINAL   Final  MRSA PCR SCREENING     Status: None   Collection Time    12/31/12  8:30 PM      Result Value Range Status   MRSA by PCR NEGATIVE  NEGATIVE Final   Comment:            The GeneXpert MRSA Assay (FDA     approved for NASAL specimens     only), is one component of a     comprehensive MRSA colonization     surveillance program. It is not     intended to diagnose MRSA     infection nor to guide or     monitor treatment for     MRSA infections.   Studies/Results: Dg Chest 2 View  12/30/2012  *RADIOLOGY REPORT*  Clinical Data: Increased shortness of breath.  Confusion.  CHEST - 2 VIEW  Comparison: 12/26/2012.  Findings: Mildly progressive patchy opacity throughout both lungs. Borderline enlarged cardiac silhouette.  Thoracic spine degenerative changes.  No pleural fluid.  IMPRESSION: Progressive extensive bilateral pneumonia.   Original Report Authenticated By: Claudie Revering, M.D.    Dg Chest Port 1 View  12/31/2012  *RADIOLOGY REPORT*  Clinical Data: Respiratory distress.  PORTABLE CHEST - 1 VIEW  Comparison: 12/30/2012  Findings: Mild cardiac enlargement.  Pulmonary vascular congestion. Diffuse nodular airspace parenchymal disease throughout both lungs which may be due to pneumonia, edema, or ARDS.  Changes are stable since the previous study.  No pneumothorax.  IMPRESSION: Cardiac enlargement pulmonary vascular congestion and diffuse bilateral airspace disease.  Stable appearance since previous study.   Original Report Authenticated By: Lucienne Capers, M.D.     Medications:  Scheduled: . ipratropium  0.5 mg Nebulization Q4H   And  . albuterol  2.5 mg Nebulization Q4H  . ARIPiprazole  5 mg  Oral Daily  . aspirin  81 mg Oral Daily  . azithromycin  500 mg Intravenous Q24H  . benztropine  0.5 mg Oral Daily  . ceFEPime (MAXIPIME) IV  2 g Intravenous Q12H  . colesevelam  1,875 mg Oral BID WC  . DULoxetine  40 mg Oral BID  . enoxaparin (LOVENOX) injection  40 mg Subcutaneous Daily  . furosemide      . insulin aspart  0-9 Units Subcutaneous TID WC  . insulin aspart  12  Units Subcutaneous TID WC  . insulin glargine  35 Units Subcutaneous QHS  . levothyroxine  200 mcg Oral QAC breakfast  . lisinopril  20 mg Oral Daily  . methylPREDNISolone sodium succinate  125 mg Intravenous Q8H  . metoprolol  25 mg Oral BID  . niacin  1,000 mg Oral QHS  . omega-3 acid ethyl esters  2 g Oral Daily  . oseltamivir  75 mg Oral BID  . pantoprazole  80 mg Oral Daily  . simvastatin  20 mg Oral q1800  . traZODone  100 mg Oral QHS  . vancomycin  1,250 mg Intravenous Q24H   Continuous: . sodium chloride 75 mL/hr at 01/01/13 0500   UZH:[id number], haloperidol lactate, LORazepam, nitroGLYCERIN  Assesment: She has pneumonia following influenza. Her chest x-ray looks like it may be more of an ARDS picture or CHF. She received Lasix last night. Principal Problem:   Pneumonia due to infectious agent Active Problems:   DIABETES MELLITUS, TYPE II, CONTROLLED   HYPERLIPIDEMIA   ANXIETY STATE NOS   DEPRESSION   COPD    Plan: I have taken the liberty of ordering more Lasix and  BNP and an echocardiogram. I don't think she needs to be intubated right now    LOS: 6 days   Jonnathan Birman L 01/01/2013, 8:48 AM

## 2013-01-02 ENCOUNTER — Inpatient Hospital Stay (HOSPITAL_COMMUNITY): Payer: Medicare Other

## 2013-01-02 LAB — BASIC METABOLIC PANEL
Calcium: 8.1 mg/dL — ABNORMAL LOW (ref 8.4–10.5)
GFR calc non Af Amer: 62 mL/min — ABNORMAL LOW (ref >90)
Glucose, Bld: 175 mg/dL — ABNORMAL HIGH (ref 70–99)
Sodium: 143 mEq/L (ref 135–145)

## 2013-01-02 LAB — GLUCOSE, CAPILLARY
Glucose-Capillary: 148 mg/dL — ABNORMAL HIGH (ref 70–99)
Glucose-Capillary: 109 mg/dL — ABNORMAL HIGH (ref 70–99)
Glucose-Capillary: 141 mg/dL — ABNORMAL HIGH (ref 70–99)

## 2013-01-02 MED ORDER — SODIUM CHLORIDE 0.9 % IJ SOLN
10.0000 mL | INTRAMUSCULAR | Status: DC | PRN
  Administered 2013-01-02: 10 mL via INTRAVENOUS

## 2013-01-02 NOTE — Progress Notes (Signed)
Subjective: She is more responsive. No new complaints have been noted. She had echocardiogram but report is not available yet  Objective: Vital signs in last 24 hours: Temp:  [97.9 F (36.6 C)-98.7 F (37.1 C)] 98.6 F (37 C) (03/21 0800) Pulse Rate:  [29-263] 75 (03/21 0802) Resp:  [12-29] 21 (03/21 0802) BP: (105-181)/(37-120) 121/50 mmHg (03/21 0802) SpO2:  [69 %-100 %] 97 % (03/21 0802) FiO2 (%):  [50 %] 50 % (03/21 0736) Weight:  [81.3 kg (179 lb 3.7 oz)] 81.3 kg (179 lb 3.7 oz) (03/21 0500) Weight change: -1 kg (-2 lb 3.3 oz) Last BM Date: 12/30/12  Intake/Output from previous day: 03/20 0701 - 03/21 0700 In: 3023.8 [P.O.:600; I.V.:1573.8; IV Piggyback:850] Out: 2875 [Urine:2875]  PHYSICAL EXAM General appearance: moderate distress and Still confused but more alert Resp: rhonchi bilaterally Cardio: regular rate and rhythm, S1, S2 normal, no murmur, click, rub or gallop GI: soft, non-tender; bowel sounds normal; no masses,  no organomegaly Extremities: 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  Lab Results:    Basic Metabolic Panel:  Recent Labs  01/01/13 0852 01/02/13 0445  NA 143 143  K 4.4 4.3  CL 111 111  CO2 23 24  GLUCOSE 129* 175*  BUN 30* 30*  CREATININE 1.08 0.97  CALCIUM 8.4 8.1*   Liver Function Tests: No results found for this basename: AST, ALT, ALKPHOS, BILITOT, PROT, ALBUMIN,  in the last 72 hours No results found for this basename: LIPASE, AMYLASE,  in the last 72 hours No results found for this basename: AMMONIA,  in the last 72 hours CBC:  Recent Labs  12/31/12 2033 01/01/13 0852  WBC 13.1* 10.0  NEUTROABS 11.9* 8.8*  HGB 9.3* 9.2*  HCT 28.2* 28.3*  MCV 86.2 87.6  PLT 315 325   Cardiac Enzymes:  Recent Labs  12/31/12 2102 01/01/13 0254 01/01/13 0852  TROPONINI <0.30 <0.30 <0.30   BNP:  Recent Labs  01/01/13 0852  PROBNP 7581.0*   D-Dimer: No results found for this basename: DDIMER,  in the last 72  hours CBG:  Recent Labs  01/01/13 0757 01/01/13 0814 01/01/13 1145 01/01/13 1759 01/01/13 2227 01/02/13 0737  GLUCAP 122* 136* 92 130* 153* 136*   Hemoglobin A1C: No results found for this basename: HGBA1C,  in the last 72 hours Fasting Lipid Panel: No results found for this basename: CHOL, HDL, LDLCALC, TRIG, CHOLHDL, LDLDIRECT,  in the last 72 hours Thyroid Function Tests: No results found for this basename: TSH, T4TOTAL, FREET4, T3FREE, THYROIDAB,  in the last 72 hours Anemia Panel: No results found for this basename: VITAMINB12, FOLATE, FERRITIN, TIBC, IRON, RETICCTPCT,  in the last 72 hours Coagulation: No results found for this basename: LABPROT, INR,  in the last 72 hours Urine Drug Screen: Drugs of Abuse  No results found for this basename: labopia, cocainscrnur, labbenz, amphetmu, thcu, labbarb    Alcohol Level: No results found for this basename: ETH,  in the last 72 hours Urinalysis: No results found for this basename: COLORURINE, APPERANCEUR, LABSPEC, PHURINE, GLUCOSEU, HGBUR, BILIRUBINUR, KETONESUR, PROTEINUR, UROBILINOGEN, NITRITE, LEUKOCYTESUR,  in the last 72 hours Misc. Labs:  ABGS  Recent Labs  01/01/13 0450  PHART 7.279*  PO2ART 74.0*  TCO2 20.3  HCO3 21.2   CULTURES Recent Results (from the past 240 hour(s))  URINE CULTURE     Status: None   Collection Time    12/26/12  2:37 PM      Result Value Range Status  Specimen Description URINE, CATHETERIZED   Final   Special Requests NONE   Final   Culture  Setup Time 12/27/2012 05:01   Final   Colony Count NO GROWTH   Final   Culture NO GROWTH   Final   Report Status 12/28/2012 FINAL   Final  CULTURE, BLOOD (ROUTINE X 2)     Status: None   Collection Time    12/26/12  2:55 PM      Result Value Range Status   Specimen Description BLOOD LEFT ARM   Final   Special Requests BOTTLES DRAWN AEROBIC ONLY 6CC   Final   Culture NO GROWTH 5 DAYS   Final   Report Status 12/31/2012 FINAL   Final   CULTURE, BLOOD (ROUTINE X 2)     Status: None   Collection Time    12/26/12  3:10 PM      Result Value Range Status   Specimen Description BLOOD RIGHT ARM   Final   Special Requests BOTTLES DRAWN AEROBIC ONLY 6CC   Final   Culture NO GROWTH 5 DAYS   Final   Report Status 12/31/2012 FINAL   Final  MRSA PCR SCREENING     Status: None   Collection Time    12/31/12  8:30 PM      Result Value Range Status   MRSA by PCR NEGATIVE  NEGATIVE Final   Comment:            The GeneXpert MRSA Assay (FDA     approved for NASAL specimens     only), is one component of a     comprehensive MRSA colonization     surveillance program. It is not     intended to diagnose MRSA     infection nor to guide or     monitor treatment for     MRSA infections.   Studies/Results: Dg Chest Port 1 View  01/02/2013  *RADIOLOGY REPORT*  Clinical Data: 62 year old female with respiratory failure. Pneumonia.  PORTABLE CHEST - 1 VIEW  Comparison: 12/31/2012 and earlier.  Findings: Portable semi upright AP view 0630 hours.  The patient is more rotated to the right and lung volumes are mildly decreased. Overall, no significant interval change and widespread bilateral pulmonary opacity and 12/30/2012.  Stable cardiac size and mediastinal contours.  No large effusion or pneumothorax identified.  IMPRESSION: No significant change in bilateral pulmonary opacity favored to represent pneumonia since 12/30/2012.   Original Report Authenticated By: Roselyn Reef, M.D.    Dg Chest Port 1 View  12/31/2012  *RADIOLOGY REPORT*  Clinical Data: Respiratory distress.  PORTABLE CHEST - 1 VIEW  Comparison: 12/30/2012  Findings: Mild cardiac enlargement.  Pulmonary vascular congestion. Diffuse nodular airspace parenchymal disease throughout both lungs which may be due to pneumonia, edema, or ARDS.  Changes are stable since the previous study.  No pneumothorax.  IMPRESSION: Cardiac enlargement pulmonary vascular congestion and diffuse bilateral  airspace disease.  Stable appearance since previous study.   Original Report Authenticated By: Lucienne Capers, M.D.     Medications:  Scheduled: . ipratropium  0.5 mg Nebulization Q4H   And  . albuterol  2.5 mg Nebulization Q4H  . ARIPiprazole  5 mg Oral Daily  . aspirin  81 mg Oral Daily  . azithromycin  500 mg Intravenous Q24H  . benztropine  0.5 mg Oral Daily  . ceFEPime (MAXIPIME) IV  2 g Intravenous Q12H  . colesevelam  1,875 mg Oral BID WC  . DULoxetine  40 mg Oral BID  . enoxaparin (LOVENOX) injection  40 mg Subcutaneous Daily  . furosemide  40 mg Intravenous Daily  . insulin aspart  0-9 Units Subcutaneous TID WC  . insulin aspart  12 Units Subcutaneous TID WC  . insulin glargine  35 Units Subcutaneous QHS  . levothyroxine  200 mcg Oral QAC breakfast  . lisinopril  20 mg Oral Daily  . methylPREDNISolone sodium succinate  125 mg Intravenous Q8H  . metoprolol  25 mg Oral BID  . niacin  1,000 mg Oral QHS  . omega-3 acid ethyl esters  2 g Oral Daily  . pantoprazole  80 mg Oral Daily  . simvastatin  20 mg Oral q1800  . traZODone  100 mg Oral QHS  . vancomycin  1,250 mg Intravenous Q24H   Continuous: . sodium chloride 75 mL/hr at 01/02/13 5183   UPB:DHDIXBOER, haloperidol lactate, LORazepam, nitroGLYCERIN, traMADol  Assesment: She was admitted with pneumonia which appears to be post influenza. She is on broad-spectrum IV antibiotics. She had been very delirious but is better now. She is still somewhat confused. She may have an element of CHF/volume overload an echocardiogram is pending. Her BNP was elevated Principal Problem:   Pneumonia due to infectious agent Active Problems:   DIABETES MELLITUS, TYPE II, CONTROLLED   HYPERLIPIDEMIA   ANXIETY STATE NOS   DEPRESSION   COPD    Plan: Continue current treatments and medications. Await echocardiogram    LOS: 7 days   Tapanga Ottaway L 01/02/2013, 8:35 AM

## 2013-01-02 NOTE — Progress Notes (Signed)
[name], GUSE NO.:  [id number]  MEDICAL RECORD NO.:  [id number]  LOCATION:  IC10                          FACILITY:  [facility]  PHYSICIAN:  Unk Lightning, MDDATE OF BIRTH:  [birth year]  DATE OF PROCEDURE: DATE OF DISCHARGE:                                PROGRESS NOTE   PROBLEMS: 1. Multilobar pneumonia. 2. Chronic obstructive pulmonary disease. 3. Altered mental status possible delirium. 4. Insulin-dependent diabetes. 5. Possible metabolic encephalopathy. 6. Respiratory insufficiency due to multilobar pneumonia, chronic     obstructive pulmonary disease. 7. Iron deficiency. 8. Anemia, hemoglobin 9.2.  Awaiting stools for occult blood.  For several days down still not performed although ordered.  Folate and B12 within normal limits.  LABORATORY DATA:  Blood gases, pH 7.27, pCO2 47, pO2 74 on 50% mask. The patient refuses to wear BiPAP. Echocardiogram reveals normal systolic function.  PHYSICAL EXAMINATION:  VITAL SIGNS:  Blood pressure is 138/62, pulse is sinus in 64 and regular. LUNGS:  Diminished wheezing as compared to yesterday.  Diminished breath sounds at the bases.  No rales audible.  Scattered rhonchi.  Mild end- expiratory wheeze. HEART:  Regular rhythm.  No S3, S4.  No heaves, thrills, or rubs. ABDOMEN:  Soft, nontender.  Bowel sounds normoactive. EXTREMITIES:  Plantars are downgoing bilaterally.  PLAN:  Right now is to continue vanc, Zosyn, and Maxipime.  Decrease Solu-Medrol from 125 to 80 IV.  Await stools for occult blood.  We will consider iron supplementation after stools. Hemoccults were obtained. Monitor renal function, CBC.  Awaiting Neurology consult for altered mental status at present.    Unk Lightning, MD    RMD/MEDQ  D:  01/02/2013  T:  01/02/2013  Job:  [id number]

## 2013-01-02 NOTE — Progress Notes (Signed)
ANTIBIOTIC CONSULT NOTE  Pharmacy Consult for Vancomycin Indication: rule out pneumonia  Allergies  Allergen Reactions  . Ciprofloxacin     REACTION: Rash  . Iohexol      Desc: CHEST TIGHTNESS,BRETHING PROBLEMS NEEDS PRE MEDS   . Neomycin-Bacitracin Zn-Polymyx     REACTION: Rash  . Penicillins     REACTION: Rash  . Povidone     REACTION: Rash   Patient Measurements: Height: 5' 6"  (167.6 cm) Weight: 179 lb 3.7 oz (81.3 kg) IBW/kg (Calculated) : 59.3  Vital Signs: Temp: 98.6 F (37 C) (03/21 0800) Temp src: Axillary (03/21 0800) BP: 121/50 mmHg (03/21 0802) Pulse Rate: 75 (03/21 0802) Intake/Output from previous day: 03/20 0701 - 03/21 0700 In: 3023.8 [P.O.:600; I.V.:1573.8; IV Piggyback:850] Out: 2875 [Urine:2875] Intake/Output from this shift:    Labs:  Recent Labs  12/30/12 1103  12/31/12 2033 01/01/13 0852 01/02/13 0445  WBC 10.5  --  13.1* 10.0  --   HGB 9.4*  --  9.3* 9.2*  --   PLT 288  --  315 325  --   CREATININE  --   < > 1.10 1.08 0.97  < > = values in this interval not displayed. Estimated Creatinine Clearance: 65.5 ml/min (by C-G formula based on Cr of 0.97).  Recent Labs  12/30/12 1103  Swartz 13.0    Microbiology: Recent Results (from the past 720 hour(s))  URINE CULTURE     Status: None   Collection Time    12/26/12  2:37 PM      Result Value Range Status   Specimen Description URINE, CATHETERIZED   Final   Special Requests NONE   Final   Culture  Setup Time 12/27/2012 05:01   Final   Colony Count NO GROWTH   Final   Culture NO GROWTH   Final   Report Status 12/28/2012 FINAL   Final  CULTURE, BLOOD (ROUTINE X 2)     Status: None   Collection Time    12/26/12  2:55 PM      Result Value Range Status   Specimen Description BLOOD LEFT ARM   Final   Special Requests BOTTLES DRAWN AEROBIC ONLY 6CC   Final   Culture NO GROWTH 5 DAYS   Final   Report Status 12/31/2012 FINAL   Final  CULTURE, BLOOD (ROUTINE X 2)     Status: None    Collection Time    12/26/12  3:10 PM      Result Value Range Status   Specimen Description BLOOD RIGHT ARM   Final   Special Requests BOTTLES DRAWN AEROBIC ONLY 6CC   Final   Culture NO GROWTH 5 DAYS   Final   Report Status 12/31/2012 FINAL   Final  MRSA PCR SCREENING     Status: None   Collection Time    12/31/12  8:30 PM      Result Value Range Status   MRSA by PCR NEGATIVE  NEGATIVE Final   Comment:            The GeneXpert MRSA Assay (FDA     approved for NASAL specimens     only), is one component of a     comprehensive MRSA colonization     surveillance program. It is not     intended to diagnose MRSA     infection nor to guide or     monitor treatment for     MRSA infections.   Medical History: Past Medical History  Diagnosis Date  . Diabetes mellitus   . COPD (chronic obstructive pulmonary disease)   . ASCVD (arteriosclerotic cardiovascular disease)     MI in 96 requiring BMS CX; DES to M1 in 2000;normal coronary angiography in 2004  . Hyperlipidemia   . Hypothyroidism   . Tobacco abuse   . Depression   . DVT (deep venous thrombosis)   . Nephrolithiasis 2006    stone extraction   . Amputation of hand, right     traumatic  . Allergic rhinitis   . Cholelithiasis   . GERD (gastroesophageal reflux disease)   . Low back pain   . Peripheral neuropathy   . Diabetes mellitus type I   . DDD (degenerative disc disease), lumbar   . Sciatic pain     right  . Tremor    Medications:  Scheduled:  . ipratropium  0.5 mg Nebulization Q4H   And  . albuterol  2.5 mg Nebulization Q4H  . ARIPiprazole  5 mg Oral Daily  . aspirin  81 mg Oral Daily  . azithromycin  500 mg Intravenous Q24H  . benztropine  0.5 mg Oral Daily  . ceFEPime (MAXIPIME) IV  2 g Intravenous Q12H  . colesevelam  1,875 mg Oral BID WC  . DULoxetine  40 mg Oral BID  . enoxaparin (LOVENOX) injection  40 mg Subcutaneous Daily  . [EXPIRED] furosemide      . furosemide  40 mg Intravenous Daily  .  insulin aspart  0-9 Units Subcutaneous TID WC  . insulin aspart  12 Units Subcutaneous TID WC  . insulin glargine  35 Units Subcutaneous QHS  . levothyroxine  200 mcg Oral QAC breakfast  . lisinopril  20 mg Oral Daily  . methylPREDNISolone sodium succinate  125 mg Intravenous Q8H  . metoprolol  25 mg Oral BID  . niacin  1,000 mg Oral QHS  . omega-3 acid ethyl esters  2 g Oral Daily  . [EXPIRED] oseltamivir  75 mg Oral BID  . pantoprazole  80 mg Oral Daily  . simvastatin  20 mg Oral q1800  . traZODone  100 mg Oral QHS  . vancomycin  1,250 mg Intravenous Q24H  . [DISCONTINUED] furosemide  40 mg Intravenous Daily   Assessment: 62 yo F admitted with COPD exacerbation and CAP.  She is on day#7 Vancomycin, Zithromax, and day#3 Cefepime.   Renal function has been stable.  She is afebrile with normal WBC.  Cx data has been normal to date.  Vancomycin trough was slightly less than goal on 3/18 and dose was increased.  Goal of Therapy:  Vancomycin trough level 15-20 mcg/ml  Plan:  Vancomycin to 1224m IV every 24 hours. Check weekly Vancomycin trough level Cefepime 2gm IV Q12h  Zithromax 5013mIV Q24h Monitor renal function and cx data  Duration of therapy per MD  LiBiagio Borg/21/2014,8:18 AM

## 2013-01-02 NOTE — Consult Note (Signed)
Iota A. Merlene Laughter, MD     [email or web address]          [name] is an 62 y.o. female.   ASSESSMENT/PLAN: Toxin metabolic encephalopathy likely due to multiple etiologies including medication effect and acute medical illness/multilobar pneumonia.  The patient is a 62 year old white female who presents with increasing dyspnea. She has been diagnosed as having multilobar pneumonia. She suspected of having influenza related pneumonia. The patient is in the ICU because of increasing dyspnea and respiratory distress. She has had increasing confusion and hence this neurological consultation. It appears that the patient has had tremors. The notes indicate that the tremors have been long-term however. The tremors apparently have worsened however. The patient also has underlying COPD and is on steroids. The review of systems cannot be carried out because of cognitive impairment.  GENERAL: This average weight lady who is on oxygen.  HEENT: The patient has continuous mostly horizontal and also vertical high-frequency low amplitude tremors of the head.  ABDOMEN: soft  EXTREMITIES: No edema. 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   BACK: Unremarkable.  SKIN: Normal by inspection.    MENTAL STATUS: Patient lays in bed with eyes closed. She is moaning and groaning. There is no other verbal output. She does not follow commands.  CRANIAL NERVES: Pupils are equal, round and reactive to light and accomodation; extra ocular movements are full, there is no significant nystagmus; upper and lower facial muscles are normal in strength and symmetric, there is no flattening of the nasolabial folds; tongue is midline  MOTOR: She appears to move both sides vigorously and well. There is no focal deficit. Tone and bulk are normal.  COORDINATION: The patient has frequent low frequency high amplitude tremors bilaterally. There is no dysmetria. There is mild cogwheeling bilaterally. No significant  bradykinesia or rigidity however.  REFLEXES: Deep tendon reflexes are symmetrical and normal. Plantar responses are flexor bilaterally.   Blood test 12/2012: TSH 0.7 and vitamin B12 786.   Past Medical History  Diagnosis Date  . Diabetes mellitus   . COPD (chronic obstructive pulmonary disease)   . ASCVD (arteriosclerotic cardiovascular disease)     MI in 96 requiring BMS CX; DES to M1 in 2000;normal coronary angiography in 2004  . Hyperlipidemia   . Hypothyroidism   . Tobacco abuse   . Depression   . DVT (deep venous thrombosis)   . Nephrolithiasis 2006    stone extraction   . Amputation of hand, right     traumatic  . Allergic rhinitis   . Cholelithiasis   . GERD (gastroesophageal reflux disease)   . Low back pain   . Peripheral neuropathy   . Diabetes mellitus type I   . DDD (degenerative disc disease), lumbar   . Sciatic pain     right  . Tremor     Past Surgical History  Procedure Laterality Date  . Dilation and curettage of uterus  1974  . Partial hysterectomy  1978  . Total abdominal hysterectomy w/ bilateral salpingoophorectomy  2002  . Shoulder surgery      Left shoulder for RTC;left arm surgery '98/left hand surgery 2001  . Cholecystectomy    . Umbilical hernia repair  2008  . Colonoscopy  01/2009    nl repeat in 5  years  . Back surgery    . Hand amputation Right     traumatic    Family History  Problem Relation Age of Onset  . Depression Mother   . Bipolar  disorder Mother   . Dementia Mother   . Alcohol abuse Father     Social History:  reports that she has been smoking Cigarettes.  She has a 20 pack-year smoking history. She does not have any smokeless tobacco history on file. She reports that she does not drink alcohol or use illicit drugs.  Allergies:  Allergies  Allergen Reactions  . Ciprofloxacin     REACTION: Rash  . Iohexol      Desc: CHEST TIGHTNESS,BRETHING PROBLEMS NEEDS PRE MEDS   . Neomycin-Bacitracin Zn-Polymyx     REACTION:  Rash  . Penicillins     REACTION: Rash  . Povidone     REACTION: Rash    Medications: Prior to Admission medications   Medication Sig Start Date End Date Taking? Authorizing Provider  ARIPiprazole (ABILIFY) 5 MG tablet Take 1 tablet (5 mg total) by mouth daily. 10/02/12  Yes Darrol Jump, MD  aspirin 81 MG chewable tablet Chew 81 mg by mouth daily.   Yes Historical Provider, MD  beclomethasone (QVAR) 40 MCG/ACT inhaler Inhale 2 puffs into the lungs 2 (two) times daily.     Yes Historical Provider, MD  benztropine (COGENTIN) 0.5 MG tablet TAKE 1 TABLET (0.5 MG TOTAL)  BY MOUTH DAILY. 12/11/12  Yes Freddi Che, MD  clindamycin (CLEOCIN) 300 MG capsule Take 300 mg by mouth 3 (three) times daily.  05/05/12  Yes Historical Provider, MD  clonazePAM (KLONOPIN) 0.5 MG tablet Take 0.5-1 mg by mouth 2 (two) times daily. Take 1 in am and 2 at bed time 10/02/12  Yes Darrol Jump, MD  colesevelam [facility]) 625 MG tablet Take 1,875 mg by mouth 2 (two) times daily with a meal.     Yes Historical Provider, MD  DULoxetine (CYMBALTA) 20 MG capsule Take 2 capsules (40 mg total) by mouth 2 (two) times daily. 10/02/12 10/02/13 Yes Darrol Jump, MD  esomeprazole (NEXIUM) 40 MG capsule Take 40 mg by mouth daily before breakfast.     Yes Historical Provider, MD  fish oil-omega-3 fatty acids 1000 MG capsule Take 2 g by mouth daily.     Yes Historical Provider, MD  gabapentin (NEURONTIN) 100 MG capsule Take 1 capsule by mouth 3 (three) times daily. 11/23/12  Yes Historical Provider, MD  glyBURIDE (DIABETA) 5 MG tablet Take 5 mg by mouth 2 (two) times daily.    Yes Historical Provider, MD  levothyroxine (SYNTHROID, LEVOTHROID) 200 MCG tablet Take 400 mcg by mouth daily.   Yes Historical Provider, MD  lisinopril (PRINIVIL,ZESTRIL) 20 MG tablet Take 20 mg by mouth daily.     Yes Historical Provider, MD  metFORMIN (GLUCOPHAGE) 500 MG tablet Take 500 mg by mouth 2 (two) times daily.   Yes Historical Provider, MD    metoprolol (LOPRESSOR) 50 MG tablet Take 25 mg by mouth 2 (two) times daily.    Yes Historical Provider, MD  naproxen (NAPROSYN) 500 MG tablet Take 500 mg by mouth 2 (two) times daily.  04/04/12  Yes Historical Provider, MD  niacin (NIASPAN) 1000 MG CR tablet Take 1,000 mg by mouth at bedtime.     Yes Historical Provider, MD  nitroGLYCERIN (NITROSTAT) 0.4 MG SL tablet Place 0.4 mg under the tongue every 5 (five) minutes as needed. Chest pain   Yes Historical Provider, MD  pravastatin (PRAVACHOL) 40 MG tablet Take 40 mg by mouth daily.     Yes Historical Provider, MD  traZODone (DESYREL) 100 MG tablet Take 1 tablet (100  mg total) by mouth at bedtime. 10/02/12  Yes Darrol Jump, MD    Scheduled Meds: . ipratropium  0.5 mg Nebulization Q4H   And  . albuterol  2.5 mg Nebulization Q4H  . ARIPiprazole  5 mg Oral Daily  . aspirin  81 mg Oral Daily  . azithromycin  500 mg Intravenous Q24H  . benztropine  0.5 mg Oral Daily  . ceFEPime (MAXIPIME) IV  2 g Intravenous Q12H  . colesevelam  1,875 mg Oral BID WC  . DULoxetine  40 mg Oral BID  . enoxaparin (LOVENOX) injection  40 mg Subcutaneous Daily  . furosemide  40 mg Intravenous Daily  . insulin aspart  0-9 Units Subcutaneous TID WC  . insulin aspart  12 Units Subcutaneous TID WC  . insulin glargine  35 Units Subcutaneous QHS  . levothyroxine  200 mcg Oral QAC breakfast  . lisinopril  20 mg Oral Daily  . methylPREDNISolone sodium succinate  125 mg Intravenous Q8H  . metoprolol  25 mg Oral BID  . niacin  1,000 mg Oral QHS  . omega-3 acid ethyl esters  2 g Oral Daily  . pantoprazole  80 mg Oral Daily  . simvastatin  20 mg Oral q1800  . traZODone  100 mg Oral QHS  . vancomycin  1,250 mg Intravenous Q24H   Continuous Infusions: . sodium chloride 75 mL/hr at 01/02/13 1900   PRN Meds:.albuterol, haloperidol lactate, LORazepam, nitroGLYCERIN, sodium chloride, traMADol   Blood pressure 105/57, pulse 84, temperature 98.7 F (37.1 C),  temperature source Oral, resp. rate 23, height 5' 6"  (1.676 m), weight 81.3 kg (179 lb 3.7 oz), SpO2 93.00%.   Results for orders placed during the hospital encounter of 12/26/12 (from the past 48 hour(s))  TROPONIN I     Status: None   Collection Time    01/01/13  2:54 AM      Result Value Range   Troponin I <0.30  <0.30 ng/mL   Comment:            Due to the release kinetics of cTnI,     a negative result within the first hours     of the onset of symptoms does not rule out     myocardial infarction with certainty.     If myocardial infarction is still suspected,     repeat the test at appropriate intervals.  BLOOD GAS, ARTERIAL     Status: Abnormal   Collection Time    01/01/13  4:50 AM      Result Value Range   FIO2 40.00     Delivery systems OXYGEN MASK     pH, Arterial 7.279 (*) 7.350 - 7.450   pCO2 arterial 46.8 (*) 35.0 - 45.0 mmHg   pO2, Arterial 74.0 (*) 80.0 - 100.0 mmHg   Bicarbonate 21.2  20.0 - 24.0 mEq/L   TCO2 20.3  0 - 100 mmol/L   Acid-base deficit 4.4 (*) 0.0 - 2.0 mmol/L   O2 Saturation 93.5     Patient temperature 37.0     Collection site LEFT RADIAL     Drawn by [id number]     Sample type ARTERIAL     Allens test (pass/fail) PASS  PASS  GLUCOSE, CAPILLARY     Status: Abnormal   Collection Time    01/01/13  7:57 AM      Result Value Range   Glucose-Capillary 122 (*) 70 - 99 mg/dL   Comment 1 Documented in Chart  Comment 2 Notify RN    GLUCOSE, CAPILLARY     Status: Abnormal   Collection Time    01/01/13  8:14 AM      Result Value Range   Glucose-Capillary 136 (*) 70 - 99 mg/dL  CBC WITH DIFFERENTIAL     Status: Abnormal   Collection Time    01/01/13  8:52 AM      Result Value Range   WBC 10.0  4.0 - 10.5 K/uL   RBC 3.23 (*) 3.87 - 5.11 MIL/uL   Hemoglobin 9.2 (*) 12.0 - 15.0 g/dL   HCT 28.3 (*) 36.0 - 46.0 %   MCV 87.6  78.0 - 100.0 fL   MCH 28.5  26.0 - 34.0 pg   MCHC 32.5  30.0 - 36.0 g/dL   RDW 16.8 (*) 11.5 - 15.5 %   Platelets 325  150  - 400 K/uL   Neutrophils Relative 89 (*) 43 - 77 %   Neutro Abs 8.8 (*) 1.7 - 7.7 K/uL   Lymphocytes Relative 8 (*) 12 - 46 %   Lymphs Abs 0.8  0.7 - 4.0 K/uL   Monocytes Relative 4  3 - 12 %   Monocytes Absolute 0.4  0.1 - 1.0 K/uL   Eosinophils Relative 0  0 - 5 %   Eosinophils Absolute 0.0  0.0 - 0.7 K/uL   Basophils Relative 0  0 - 1 %   Basophils Absolute 0.0  0.0 - 0.1 K/uL  BASIC METABOLIC PANEL     Status: Abnormal   Collection Time    01/01/13  8:52 AM      Result Value Range   Sodium 143  135 - 145 mEq/L   Potassium 4.4  3.5 - 5.1 mEq/L   Chloride 111  96 - 112 mEq/L   CO2 23  19 - 32 mEq/L   Glucose, Bld 129 (*) 70 - 99 mg/dL   BUN 30 (*) 6 - 23 mg/dL   Creatinine, Ser 1.08  0.50 - 1.10 mg/dL   Calcium 8.4  8.4 - 10.5 mg/dL   GFR calc non Af Amer 54 (*) >90 mL/min   GFR calc Af Amer 63 (*) >90 mL/min   Comment:            The eGFR has been calculated     using the CKD EPI equation.     This calculation has not been     validated in all clinical     situations.     eGFR's persistently     <90 mL/min signify     possible Chronic Kidney Disease.  PRO B NATRIURETIC PEPTIDE     Status: Abnormal   Collection Time    01/01/13  8:52 AM      Result Value Range   Pro B Natriuretic peptide (BNP) 7581.0 (*) 0 - 125 pg/mL  TROPONIN I     Status: None   Collection Time    01/01/13  8:52 AM      Result Value Range   Troponin I <0.30  <0.30 ng/mL   Comment:            Due to the release kinetics of cTnI,     a negative result within the first hours     of the onset of symptoms does not rule out     myocardial infarction with certainty.     If myocardial infarction is still suspected,     repeat the test at appropriate  intervals.  GLUCOSE, CAPILLARY     Status: None   Collection Time    01/01/13 11:45 AM      Result Value Range   Glucose-Capillary 92  70 - 99 mg/dL   Comment 1 Documented in Chart     Comment 2 Notify RN    GLUCOSE, CAPILLARY     Status: Abnormal    Collection Time    01/01/13  5:59 PM      Result Value Range   Glucose-Capillary 130 (*) 70 - 99 mg/dL   Comment 1 Documented in Chart     Comment 2 Notify RN    GLUCOSE, CAPILLARY     Status: Abnormal   Collection Time    01/01/13 10:27 PM      Result Value Range   Glucose-Capillary 153 (*) 70 - 99 mg/dL   Comment 1 Documented in Chart     Comment 2 Notify RN    BASIC METABOLIC PANEL     Status: Abnormal   Collection Time    01/02/13  4:45 AM      Result Value Range   Sodium 143  135 - 145 mEq/L   Potassium 4.3  3.5 - 5.1 mEq/L   Chloride 111  96 - 112 mEq/L   CO2 24  19 - 32 mEq/L   Glucose, Bld 175 (*) 70 - 99 mg/dL   BUN 30 (*) 6 - 23 mg/dL   Creatinine, Ser 0.97  0.50 - 1.10 mg/dL   Calcium 8.1 (*) 8.4 - 10.5 mg/dL   GFR calc non Af Amer 62 (*) >90 mL/min   GFR calc Af Amer 72 (*) >90 mL/min   Comment:            The eGFR has been calculated     using the CKD EPI equation.     This calculation has not been     validated in all clinical     situations.     eGFR's persistently     <90 mL/min signify     possible Chronic Kidney Disease.  GLUCOSE, CAPILLARY     Status: Abnormal   Collection Time    01/02/13  7:37 AM      Result Value Range   Glucose-Capillary 136 (*) 70 - 99 mg/dL  GLUCOSE, CAPILLARY     Status: Abnormal   Collection Time    01/02/13 11:40 AM      Result Value Range   Glucose-Capillary 148 (*) 70 - 99 mg/dL  GLUCOSE, CAPILLARY     Status: Abnormal   Collection Time    01/02/13  3:48 PM      Result Value Range   Glucose-Capillary 109 (*) 70 - 99 mg/dL  GLUCOSE, CAPILLARY     Status: Abnormal   Collection Time    01/02/13  9:08 PM      Result Value Range   Glucose-Capillary 141 (*) 70 - 99 mg/dL    Dg Chest Port 1 View  01/02/2013  *RADIOLOGY REPORT*  Clinical Data: 62 year old female with respiratory failure. Pneumonia.  PORTABLE CHEST - 1 VIEW  Comparison: 12/31/2012 and earlier.  Findings: Portable semi upright AP view 0630 hours.  The  patient is more rotated to the right and lung volumes are mildly decreased. Overall, no significant interval change and widespread bilateral pulmonary opacity and 12/30/2012.  Stable cardiac size and mediastinal contours.  No large effusion or pneumothorax identified.  IMPRESSION: No significant change in bilateral pulmonary opacity favored to represent  pneumonia since 12/30/2012.   Original Report Authenticated By: Roselyn Reef, M.D.    Dg Chest Port 1 View  12/31/2012  *RADIOLOGY REPORT*  Clinical Data: Respiratory distress.  PORTABLE CHEST - 1 VIEW  Comparison: 12/30/2012  Findings: Mild cardiac enlargement.  Pulmonary vascular congestion. Diffuse nodular airspace parenchymal disease throughout both lungs which may be due to pneumonia, edema, or ARDS.  Changes are stable since the previous study.  No pneumothorax.  IMPRESSION: Cardiac enlargement pulmonary vascular congestion and diffuse bilateral airspace disease.  Stable appearance since previous study.   Original Report Authenticated By: Lucienne Capers, M.D.         Calyx Hawker A. Merlene Laughter, M.D.  Diplomate, Tax adviser of Psychiatry and Neurology ( Neurology). 01/02/2013, 9:45 PM

## 2013-01-02 NOTE — Progress Notes (Signed)
UR Chart Review Completed  

## 2013-01-02 NOTE — Progress Notes (Signed)
[id number] 

## 2013-01-02 NOTE — Progress Notes (Signed)
NAMEMICHAELLE, BOTTOMLEY NO.:  [id number]  MEDICAL RECORD NO.:  [id number]  LOCATION:  IC10                          FACILITY:  [facility]  PHYSICIAN:  Unk Lightning, MDDATE OF BIRTH:  27-Apr-1951  DATE OF PROCEDURE: DATE OF DISCHARGE:                                PROGRESS NOTE   The patient was transferred to the unit last night, had some episodes of desaturation, currently oxygenating well in ICU.  Blood pressure is 132/63, pulse is 76 and sinus rhythm, temperature 98.5, O2 sat is 100%. Blood gas this morning pH 7.27, pCO2 46, pO2 was 74, on 50%.  The patient continually refuses BiPAP.  Risks and hazards explained to her. B12 folate levels within normal limits.  She is iron deficient.  I am waiting stools for Hemoccult before beginning iron therapy to avoid confusion.  Hemoglobin is 9.2 and stable.  WBC is 10, creatinine 1.08, somewhat improved.  Chest x-ray reveals mildly progressive multilobar infiltrates, currently on Maxipime, Solu-Medrol, vancomycin for antibiotic as well as Zithromax.  She likewise has hypothyroidism, benign essential tremor, chronic depression, anxiety disorder, hyperlipidemia, COPD, with continued smoking up until the time was in the hospital.  PLAN:  At present, is to continue triple antibiotics, aggressive nebulizer therapy.  Continue Solu-Medrol.  There is less wheezing today than previous.  A 2-D echo was performed today __________ see results yet.  Aggressive pulmonary toilet, ambulation, sitting in chair, and we will make further recommendations as the database expands.     Unk Lightning, MD     RMD/MEDQ  D:  01/01/2013  T:  01/02/2013  Job:  [id number]

## 2013-01-03 LAB — GLUCOSE, CAPILLARY
Glucose-Capillary: 121 mg/dL — ABNORMAL HIGH (ref 70–99)
Glucose-Capillary: 105 mg/dL — ABNORMAL HIGH (ref 70–99)

## 2013-01-03 MED ORDER — DEXTROSE 50 % IV SOLN
INTRAVENOUS | Status: AC
  Administered 2013-01-03: 50 mL
  Filled 2013-01-03: qty 50

## 2013-01-03 NOTE — Progress Notes (Signed)
[id number] 

## 2013-01-03 NOTE — Progress Notes (Signed)
Evening accucheck was 45. Hypoglycemia protocol followed. Patient given 1 amp D50 via IV. Recheck reveals cbg of 114. Will continue to monitor

## 2013-01-03 NOTE — Progress Notes (Signed)
Subjective: She seems a little better. She has still been somewhat delirious. This is however much improved. She is less short of breath.  Objective: Vital signs in last 24 hours: Temp:  [97.6 F (36.4 C)-98.7 F (37.1 C)] 97.8 F (36.6 C) (03/22 0800) Pulse Rate:  [57-116] 67 (03/22 0600) Resp:  [11-25] 12 (03/22 0600) BP: (52-183)/(20-105) 117/47 mmHg (03/22 0600) SpO2:  [89 %-100 %] 97 % (03/22 0600) FiO2 (%):  [50 %] 50 % (03/21 1134) Weight:  [80.7 kg (177 lb 14.6 oz)] 80.7 kg (177 lb 14.6 oz) (03/22 0500) Weight change: -0.6 kg (-1 lb 5.2 oz) Last BM Date: 12/30/12  Intake/Output from previous day: 03/21 0701 - 03/22 0700 In: 2565 [P.O.:240; I.V.:1725; IV Piggyback:600] Out: 2350 [Urine:2350]  PHYSICAL EXAM General appearance: mild distress and Still mildly confused but more alert Resp: rhonchi bilaterally Cardio: regular rate and rhythm, S1, S2 normal, no murmur, click, rub or gallop GI: soft, non-tender; bowel sounds normal; no masses,  no organomegaly Extremities: 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  Lab Results:    Basic Metabolic Panel:  Recent Labs  01/01/13 0852 01/02/13 0445  NA 143 143  K 4.4 4.3  CL 111 111  CO2 23 24  GLUCOSE 129* 175*  BUN 30* 30*  CREATININE 1.08 0.97  CALCIUM 8.4 8.1*   Liver Function Tests: No results found for this basename: AST, ALT, ALKPHOS, BILITOT, PROT, ALBUMIN,  in the last 72 hours No results found for this basename: LIPASE, AMYLASE,  in the last 72 hours No results found for this basename: AMMONIA,  in the last 72 hours CBC:  Recent Labs  12/31/12 2033 01/01/13 0852  WBC 13.1* 10.0  NEUTROABS 11.9* 8.8*  HGB 9.3* 9.2*  HCT 28.2* 28.3*  MCV 86.2 87.6  PLT 315 325   Cardiac Enzymes:  Recent Labs  12/31/12 2102 01/01/13 0254 01/01/13 0852  TROPONINI <0.30 <0.30 <0.30   BNP:  Recent Labs  01/01/13 0852  PROBNP 7581.0*   D-Dimer: No results found for this basename: DDIMER,  in the  last 72 hours CBG:  Recent Labs  01/01/13 2227 01/02/13 0737 01/02/13 1140 01/02/13 1548 01/02/13 2108 01/03/13 0724  GLUCAP 153* 136* 148* 109* 141* 121*   Hemoglobin A1C: No results found for this basename: HGBA1C,  in the last 72 hours Fasting Lipid Panel: No results found for this basename: CHOL, HDL, LDLCALC, TRIG, CHOLHDL, LDLDIRECT,  in the last 72 hours Thyroid Function Tests: No results found for this basename: TSH, T4TOTAL, FREET4, T3FREE, THYROIDAB,  in the last 72 hours Anemia Panel: No results found for this basename: VITAMINB12, FOLATE, FERRITIN, TIBC, IRON, RETICCTPCT,  in the last 72 hours Coagulation: No results found for this basename: LABPROT, INR,  in the last 72 hours Urine Drug Screen: Drugs of Abuse  No results found for this basename: labopia, cocainscrnur, labbenz, amphetmu, thcu, labbarb    Alcohol Level: No results found for this basename: ETH,  in the last 72 hours Urinalysis: No results found for this basename: COLORURINE, APPERANCEUR, LABSPEC, PHURINE, GLUCOSEU, HGBUR, BILIRUBINUR, KETONESUR, PROTEINUR, UROBILINOGEN, NITRITE, LEUKOCYTESUR,  in the last 72 hours Misc. Labs:  ABGS  Recent Labs  01/01/13 0450  PHART 7.279*  PO2ART 74.0*  TCO2 20.3  HCO3 21.2   CULTURES Recent Results (from the past 240 hour(s))  URINE CULTURE     Status: None   Collection Time    12/26/12  2:37 PM  Result Value Range Status   Specimen Description URINE, CATHETERIZED   Final   Special Requests NONE   Final   Culture  Setup Time 12/27/2012 05:01   Final   Colony Count NO GROWTH   Final   Culture NO GROWTH   Final   Report Status 12/28/2012 FINAL   Final  CULTURE, BLOOD (ROUTINE X 2)     Status: None   Collection Time    12/26/12  2:55 PM      Result Value Range Status   Specimen Description BLOOD LEFT ARM   Final   Special Requests BOTTLES DRAWN AEROBIC ONLY 6CC   Final   Culture NO GROWTH 5 DAYS   Final   Report Status 12/31/2012 FINAL    Final  CULTURE, BLOOD (ROUTINE X 2)     Status: None   Collection Time    12/26/12  3:10 PM      Result Value Range Status   Specimen Description BLOOD RIGHT ARM   Final   Special Requests BOTTLES DRAWN AEROBIC ONLY 6CC   Final   Culture NO GROWTH 5 DAYS   Final   Report Status 12/31/2012 FINAL   Final  MRSA PCR SCREENING     Status: None   Collection Time    12/31/12  8:30 PM      Result Value Range Status   MRSA by PCR NEGATIVE  NEGATIVE Final   Comment:            The GeneXpert MRSA Assay (FDA     approved for NASAL specimens     only), is one component of a     comprehensive MRSA colonization     surveillance program. It is not     intended to diagnose MRSA     infection nor to guide or     monitor treatment for     MRSA infections.   Studies/Results: Dg Chest Port 1 View  01/02/2013  *RADIOLOGY REPORT*  Clinical Data: 62 year old female with respiratory failure. Pneumonia.  PORTABLE CHEST - 1 VIEW  Comparison: 12/31/2012 and earlier.  Findings: Portable semi upright AP view 0630 hours.  The patient is more rotated to the right and lung volumes are mildly decreased. Overall, no significant interval change and widespread bilateral pulmonary opacity and 12/30/2012.  Stable cardiac size and mediastinal contours.  No large effusion or pneumothorax identified.  IMPRESSION: No significant change in bilateral pulmonary opacity favored to represent pneumonia since 12/30/2012.   Original Report Authenticated By: Roselyn Reef, M.D.     Medications:  Prior to Admission:  Prescriptions prior to admission  Medication Sig Dispense Refill  . ARIPiprazole (ABILIFY) 5 MG tablet Take 1 tablet (5 mg total) by mouth daily.  30 tablet  1  . aspirin 81 MG chewable tablet Chew 81 mg by mouth daily.      . beclomethasone (QVAR) 40 MCG/ACT inhaler Inhale 2 puffs into the lungs 2 (two) times daily.        . benztropine (COGENTIN) 0.5 MG tablet TAKE 1 TABLET (0.5 MG TOTAL)  BY MOUTH DAILY.  30 tablet  0   . clindamycin (CLEOCIN) 300 MG capsule Take 300 mg by mouth 3 (three) times daily.       . clonazePAM (KLONOPIN) 0.5 MG tablet Take 0.5-1 mg by mouth 2 (two) times daily. Take 1 in am and 2 at bed time      . colesevelam (WELCHOL) 625 MG tablet Take 1,875 mg by mouth  2 (two) times daily with a meal.        . DULoxetine (CYMBALTA) 20 MG capsule Take 2 capsules (40 mg total) by mouth 2 (two) times daily.  120 capsule  1  . esomeprazole (NEXIUM) 40 MG capsule Take 40 mg by mouth daily before breakfast.        . fish oil-omega-3 fatty acids 1000 MG capsule Take 2 g by mouth daily.        Marland Kitchen gabapentin (NEURONTIN) 100 MG capsule Take 1 capsule by mouth 3 (three) times daily.      Marland Kitchen glyBURIDE (DIABETA) 5 MG tablet Take 5 mg by mouth 2 (two) times daily.       Marland Kitchen levothyroxine (SYNTHROID, LEVOTHROID) 200 MCG tablet Take 400 mcg by mouth daily.      Marland Kitchen lisinopril (PRINIVIL,ZESTRIL) 20 MG tablet Take 20 mg by mouth daily.        . metFORMIN (GLUCOPHAGE) 500 MG tablet Take 500 mg by mouth 2 (two) times daily.      . metoprolol (LOPRESSOR) 50 MG tablet Take 25 mg by mouth 2 (two) times daily.       . naproxen (NAPROSYN) 500 MG tablet Take 500 mg by mouth 2 (two) times daily.       . niacin (NIASPAN) 1000 MG CR tablet Take 1,000 mg by mouth at bedtime.        . nitroGLYCERIN (NITROSTAT) 0.4 MG SL tablet Place 0.4 mg under the tongue every 5 (five) minutes as needed. Chest pain      . pravastatin (PRAVACHOL) 40 MG tablet Take 40 mg by mouth daily.        . traZODone (DESYREL) 100 MG tablet Take 1 tablet (100 mg total) by mouth at bedtime.  30 tablet  1   Scheduled: . ipratropium  0.5 mg Nebulization Q4H   And  . albuterol  2.5 mg Nebulization Q4H  . ARIPiprazole  5 mg Oral Daily  . aspirin  81 mg Oral Daily  . azithromycin  500 mg Intravenous Q24H  . benztropine  0.5 mg Oral Daily  . ceFEPime (MAXIPIME) IV  2 g Intravenous Q12H  . colesevelam  1,875 mg Oral BID WC  . DULoxetine  40 mg Oral BID  .  enoxaparin (LOVENOX) injection  40 mg Subcutaneous Daily  . furosemide  40 mg Intravenous Daily  . insulin aspart  0-9 Units Subcutaneous TID WC  . insulin aspart  12 Units Subcutaneous TID WC  . insulin glargine  35 Units Subcutaneous QHS  . levothyroxine  200 mcg Oral QAC breakfast  . lisinopril  20 mg Oral Daily  . methylPREDNISolone sodium succinate  125 mg Intravenous Q8H  . metoprolol  25 mg Oral BID  . niacin  1,000 mg Oral QHS  . omega-3 acid ethyl esters  2 g Oral Daily  . pantoprazole  80 mg Oral Daily  . simvastatin  20 mg Oral q1800  . traZODone  100 mg Oral QHS  . vancomycin  1,250 mg Intravenous Q24H   Continuous: . sodium chloride 75 mL/hr at 01/03/13 0600   QHU:[id number], haloperidol lactate, LORazepam, nitroGLYCERIN, sodium chloride, traMADol  Assesment: She has multilobar pneumonia following influenza. She developed respiratory failure and that is better. She is generally improving. She had delirium which is better. There was concern that she had congestive heart failure but 2-D echocardiogram shows normal systolic function Principal Problem:   Pneumonia due to infectious agent Active Problems:   DIABETES MELLITUS, TYPE  II, CONTROLLED   HYPERLIPIDEMIA   ANXIETY STATE NOS   DEPRESSION   COPD    Plan: No change in treatments    LOS: 8 days   Khole Branch L 01/03/2013, 9:51 AM

## 2013-01-03 NOTE — Progress Notes (Signed)
NAMELEEZA, HEINER NO.:  [id number]  MEDICAL RECORD NO.:  [id number]  LOCATION:  IC10                          FACILITY:  [facility]  PHYSICIAN:  Unk Lightning, MDDATE OF BIRTH:  [birth year]  DATE OF PROCEDURE: DATE OF DISCHARGE:                                PROGRESS NOTE   The patient is post influenza with multilobar infiltrates, on broad- spectrum antibiotics, has concomitant anemia which is iron deficiency, still awaiting stools for occult blood.  The patient had delirium, now better.  She is alert, talkative but withdrawn.  WBCs 10.0, hemoglobin 9.2, creatinine 0.97.  Blood pressure 117/57, pulse 67 and regular sinus, temperature 98.5.  She is in good glycemic control from insulin- dependent diabetes, hypertension, hyperlipidemia __________.  She likewise is hypothyroid.  TSH was somewhat elevated, currently on Synthroid 200 mcg per day.  Awaiting Neurology consult at present.  Her lungs show less wheeze, prolonged expiratory phase.  Scattered rhonchi. No rales audible.  Two-D echocardiogram reveals normal systolic function.  PLAN:  Plan right now is to continue antibiotics, continue DuoNeb nebulizer.  We will await Neurology expertise.     Unk Lightning, MD     RMD/MEDQ  D:  01/03/2013  T:  01/03/2013  Job:  [id number]

## 2013-01-04 LAB — HEPATIC FUNCTION PANEL
AST: 15 U/L (ref 0–37)
Albumin: 2.4 g/dL — ABNORMAL LOW (ref 3.5–5.2)
Alkaline Phosphatase: 121 U/L — ABNORMAL HIGH (ref 39–117)
Total Bilirubin: 0.4 mg/dL (ref 0.3–1.2)
Total Protein: 6 g/dL (ref 6.0–8.3)

## 2013-01-04 LAB — CBC WITH DIFFERENTIAL/PLATELET
Basophils Absolute: 0 10*3/uL (ref 0.0–0.1)
HCT: 29.5 % — ABNORMAL LOW (ref 36.0–46.0)
Hemoglobin: 9.2 g/dL — ABNORMAL LOW (ref 12.0–15.0)
Lymphocytes Relative: 7 % — ABNORMAL LOW (ref 12–46)
Lymphs Abs: 1 10*3/uL (ref 0.7–4.0)
MCV: 90.8 fL (ref 78.0–100.0)
Monocytes Absolute: 0.2 10*3/uL (ref 0.1–1.0)
Neutro Abs: 9 10*3/uL — ABNORMAL HIGH (ref 1.7–7.7)
RBC: 3.25 MIL/uL — ABNORMAL LOW (ref 3.87–5.11)
RDW: 16.9 % — ABNORMAL HIGH (ref 11.5–15.5)
WBC: 10 10*3/uL (ref 4.0–10.5)

## 2013-01-04 LAB — GLUCOSE, CAPILLARY
Glucose-Capillary: 191 mg/dL — ABNORMAL HIGH (ref 70–99)
Glucose-Capillary: 110 mg/dL — ABNORMAL HIGH (ref 70–99)
Glucose-Capillary: 238 mg/dL — ABNORMAL HIGH (ref 70–99)

## 2013-01-04 MED ORDER — INSULIN ASPART 100 UNIT/ML ~~LOC~~ SOLN: 4.0000 [IU] | Freq: Three times a day (TID) | SUBCUTANEOUS | Status: DC

## 2013-01-04 MED ORDER — INSULIN GLARGINE 100 UNIT/ML ~~LOC~~ SOLN
12.0000 [IU] | Freq: Every day | SUBCUTANEOUS | Status: DC
Start: 2013-01-04 — End: 2013-01-08
  Administered 2013-01-04 – 2013-01-07 (×4): 12 [IU] via SUBCUTANEOUS
  Filled 2013-01-04 (×5): qty 0.12

## 2013-01-04 NOTE — Progress Notes (Signed)
[id number] 

## 2013-01-04 NOTE — Progress Notes (Signed)
[name], [name] NO.:  [id number]  MEDICAL RECORD NO.:  [id number]  LOCATION:  IC10                          FACILITY:  [facility]  PHYSICIAN:  Unk Lightning, MDDATE OF BIRTH:  Jul 09, 1951  DATE OF PROCEDURE: DATE OF DISCHARGE:                                PROGRESS NOTE   PROBLEMS:  Multilobar pneumonia, altered mental status, possible delirium, currently on Haldol due to agitation, we will discontinue, hypertension, hyperlipidemia, insulin-dependent diabetes, severe chronic obstructive pulmonary disease, marked depression and anxiety by history. The patient currently on DuoNeb nebulizers, Maxipime, Zithromax, and vancomycin.  PHYSICAL EXAMINATION:  LUNGS:  Show prolonged inspiratory and expiratory phase, scattered rhonchi.  No wheeze audible today, no rales audible. HEART:  Regular rhythm.  No S3, S4. ABDOMEN:  Soft, nontender.  Bowel sounds normoactive.  PLAN:  Right now is to discontinue Haldol, watch for agitation, get BMET, hepatic function panel, magnesium, as well as CBC today, await Neurology consult regarding altered mental status since admission.     Unk Lightning, MD     RMD/MEDQ  D:  01/04/2013  T:  01/04/2013  Job:  [id number]

## 2013-01-04 NOTE — Progress Notes (Signed)
Subjective: I think she is about the same. She has no new complaints. She was hypoglycemic this morning.  Objective: Vital signs in last 24 hours: Temp:  [97.6 F (36.4 C)-98.2 F (36.8 C)] 98.1 F (36.7 C) (03/23 0800) Pulse Rate:  [51-87] 53 (03/23 0700) Resp:  [9-21] 13 (03/23 0700) BP: (96-173)/(36-115) 113/49 mmHg (03/23 0700) SpO2:  [93 %-100 %] 98 % (03/23 0806) Weight:  [79.9 kg (176 lb 2.4 oz)] 79.9 kg (176 lb 2.4 oz) (03/23 0500) Weight change: -0.8 kg (-1 lb 12.2 oz) Last BM Date: 01/02/13  Intake/Output from previous day: 03/22 0701 - 03/23 0700 In: 2551 [P.O.:150; I.V.:1801; IV Piggyback:600] Out: 2700 [Urine:2700]  PHYSICAL EXAM General appearance: mild distress and Still confused Resp: rhonchi bilaterally Cardio: regular rate and rhythm, S1, S2 normal, no murmur, click, rub or gallop GI: soft, non-tender; bowel sounds normal; no masses,  no organomegaly Extremities: 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  Lab Results:    Basic Metabolic Panel:  Recent Labs  01/02/13 0445  NA 143  K 4.3  CL 111  CO2 24  GLUCOSE 175*  BUN 30*  CREATININE 0.97  CALCIUM 8.1*   Liver Function Tests: No results found for this basename: AST, ALT, ALKPHOS, BILITOT, PROT, ALBUMIN,  in the last 72 hours No results found for this basename: LIPASE, AMYLASE,  in the last 72 hours No results found for this basename: AMMONIA,  in the last 72 hours CBC: No results found for this basename: WBC, NEUTROABS, HGB, HCT, MCV, PLT,  in the last 72 hours Cardiac Enzymes: No results found for this basename: CKTOTAL, CKMB, CKMBINDEX, TROPONINI,  in the last 72 hours BNP: No results found for this basename: PROBNP,  in the last 72 hours D-Dimer: No results found for this basename: DDIMER,  in the last 72 hours CBG:  Recent Labs  01/03/13 0724 01/03/13 1108 01/03/13 1614 01/03/13 2003 01/03/13 2027 01/04/13 0718  GLUCAP 121* 105* 76 45* 114* 115*   Hemoglobin  A1C: No results found for this basename: HGBA1C,  in the last 72 hours Fasting Lipid Panel: No results found for this basename: CHOL, HDL, LDLCALC, TRIG, CHOLHDL, LDLDIRECT,  in the last 72 hours Thyroid Function Tests: No results found for this basename: TSH, T4TOTAL, FREET4, T3FREE, THYROIDAB,  in the last 72 hours Anemia Panel: No results found for this basename: VITAMINB12, FOLATE, FERRITIN, TIBC, IRON, RETICCTPCT,  in the last 72 hours Coagulation: No results found for this basename: LABPROT, INR,  in the last 72 hours Urine Drug Screen: Drugs of Abuse  No results found for this basename: labopia, cocainscrnur, labbenz, amphetmu, thcu, labbarb    Alcohol Level: No results found for this basename: ETH,  in the last 72 hours Urinalysis: No results found for this basename: COLORURINE, APPERANCEUR, LABSPEC, PHURINE, GLUCOSEU, HGBUR, BILIRUBINUR, KETONESUR, PROTEINUR, UROBILINOGEN, NITRITE, LEUKOCYTESUR,  in the last 72 hours Misc. Labs:  ABGS No results found for this basename: PHART, PCO2, PO2ART, TCO2, HCO3,  in the last 72 hours CULTURES Recent Results (from the past 240 hour(s))  URINE CULTURE     Status: None   Collection Time    12/26/12  2:37 PM      Result Value Range Status   Specimen Description URINE, CATHETERIZED   Final   Special Requests NONE   Final   Culture  Setup Time 12/27/2012 05:01   Final   Colony Count NO GROWTH   Final   Culture NO GROWTH  Final   Report Status 12/28/2012 FINAL   Final  CULTURE, BLOOD (ROUTINE X 2)     Status: None   Collection Time    12/26/12  2:55 PM      Result Value Range Status   Specimen Description BLOOD LEFT ARM   Final   Special Requests BOTTLES DRAWN AEROBIC ONLY 6CC   Final   Culture NO GROWTH 5 DAYS   Final   Report Status 12/31/2012 FINAL   Final  CULTURE, BLOOD (ROUTINE X 2)     Status: None   Collection Time    12/26/12  3:10 PM      Result Value Range Status   Specimen Description BLOOD RIGHT ARM   Final    Special Requests BOTTLES DRAWN AEROBIC ONLY 6CC   Final   Culture NO GROWTH 5 DAYS   Final   Report Status 12/31/2012 FINAL   Final  MRSA PCR SCREENING     Status: None   Collection Time    12/31/12  8:30 PM      Result Value Range Status   MRSA by PCR NEGATIVE  NEGATIVE Final   Comment:            The GeneXpert MRSA Assay (FDA     approved for NASAL specimens     only), is one component of a     comprehensive MRSA colonization     surveillance program. It is not     intended to diagnose MRSA     infection nor to guide or     monitor treatment for     MRSA infections.   Studies/Results: No results found.  Medications:  Scheduled: . ipratropium  0.5 mg Nebulization Q4H   And  . albuterol  2.5 mg Nebulization Q4H  . ARIPiprazole  5 mg Oral Daily  . aspirin  81 mg Oral Daily  . azithromycin  500 mg Intravenous Q24H  . benztropine  0.5 mg Oral Daily  . ceFEPime (MAXIPIME) IV  2 g Intravenous Q12H  . colesevelam  1,875 mg Oral BID WC  . DULoxetine  40 mg Oral BID  . enoxaparin (LOVENOX) injection  40 mg Subcutaneous Daily  . furosemide  40 mg Intravenous Daily  . insulin aspart  0-9 Units Subcutaneous TID WC  . insulin aspart  12 Units Subcutaneous TID WC  . insulin glargine  35 Units Subcutaneous QHS  . levothyroxine  200 mcg Oral QAC breakfast  . lisinopril  20 mg Oral Daily  . methylPREDNISolone sodium succinate  125 mg Intravenous Q8H  . metoprolol  25 mg Oral BID  . niacin  1,000 mg Oral QHS  . omega-3 acid ethyl esters  2 g Oral Daily  . pantoprazole  80 mg Oral Daily  . simvastatin  20 mg Oral q1800  . traZODone  100 mg Oral QHS  . vancomycin  1,250 mg Intravenous Q24H   Continuous: . sodium chloride 75 mL/hr at 01/04/13 0700   PQZ:[id number], haloperidol lactate, LORazepam, nitroGLYCERIN, sodium chloride, traMADol  Assesment: She has multilobar pneumonia following influenza. She has been delirious. She is generally doing much better. She still has some trouble  with delirium Principal Problem:   Pneumonia due to infectious agent Active Problems:   DIABETES MELLITUS, TYPE II, CONTROLLED   HYPERLIPIDEMIA   ANXIETY STATE NOS   DEPRESSION   COPD    Plan: Continue current treatments including IV antibiotics et Ronney Asters. Some of the delirium may be related to her hypoglycemia  LOS: 9 days   Kiondre Grenz L 01/04/2013, 9:53 AM

## 2013-01-05 ENCOUNTER — Inpatient Hospital Stay (HOSPITAL_COMMUNITY): Payer: Medicare Other

## 2013-01-05 LAB — GLUCOSE, CAPILLARY: Glucose-Capillary: 126 mg/dL — ABNORMAL HIGH (ref 70–99)

## 2013-01-05 LAB — BASIC METABOLIC PANEL
CO2: 28 mEq/L (ref 19–32)
Chloride: 110 mEq/L (ref 96–112)
Creatinine, Ser: 0.91 mg/dL (ref 0.50–1.10)
Potassium: 3.7 mEq/L (ref 3.5–5.1)

## 2013-01-05 MED ORDER — MAGNESIUM HYDROXIDE 400 MG/5ML PO SUSP
30.0000 mL | Freq: Every day | ORAL | Status: DC | PRN
  Administered 2013-01-05: 30 mL via ORAL
  Filled 2013-01-05: qty 30

## 2013-01-05 MED ORDER — POLYSACCHARIDE IRON COMPLEX 150 MG PO CAPS
150.0000 mg | ORAL_CAPSULE | Freq: Every day | ORAL | Status: DC
  Administered 2013-01-05 – 2013-01-08 (×4): 150 mg via ORAL
  Filled 2013-01-05 (×4): qty 1

## 2013-01-05 MED ORDER — METHYLPREDNISOLONE SODIUM SUCC 125 MG IJ SOLR
80.0000 mg | Freq: Three times a day (TID) | INTRAMUSCULAR | Status: DC
  Administered 2013-01-05 – 2013-01-07 (×6): 80 mg via INTRAVENOUS
  Filled 2013-01-05 (×6): qty 2

## 2013-01-05 NOTE — Progress Notes (Signed)
Patient ID: [name], female   DOB: [birth year], 62 y.o.   MRN: [id number]  [place] A. Merlene Laughter, MD     [email or web address]          [name] is an 62 y.o. female.   Assessment/Plan: Toxin metabolic encephalopathy slightly improved.  Long-standing history of tremors. The tremors are probably due essential tremors at baseline. The tremors are undoubtedly worsened by bronchodilators and steroids.  The patient is lying in bed with eyes closed. Opens her eyes to verbal commands. She is again confused although more responsive today. She does not follow commands. She does speak in 2 or 3 word sentences but is mostly nonsensical. Facial muscle strength is symmetric. Extra ocular movements are full. She moved both sides well.    Objective: Vital signs in last 24 hours: Temp:  [97.7 F (36.5 C)-98.8 F (37.1 C)] 98.1 F (36.7 C) (03/24 0746) Pulse Rate:  [49-89] 83 (03/24 0500) Resp:  [13-24] 21 (03/24 0500) BP: (113-177)/(39-133) 155/131 mmHg (03/24 0400) SpO2:  [90 %-100 %] 95 % (03/24 0659) Weight:  [78.4 kg (172 lb 13.5 oz)] 78.4 kg (172 lb 13.5 oz) (03/24 0500)  Intake/Output from previous day: 03/23 0701 - 03/24 0700 In: 3330 [P.O.:1080; I.V.:1650; IV Piggyback:600] Out: 2600 [Urine:2600] Intake/Output this shift:   Nutritional status: Sodium Restricted   Lab Results: Results for orders placed during the hospital encounter of 12/26/12 (from the past 48 hour(s))  GLUCOSE, CAPILLARY     Status: Abnormal   Collection Time    01/03/13 11:08 AM      Result Value Range   Glucose-Capillary 105 (*) 70 - 99 mg/dL  GLUCOSE, CAPILLARY     Status: None   Collection Time    01/03/13  4:14 PM      Result Value Range   Glucose-Capillary 76  70 - 99 mg/dL  GLUCOSE, CAPILLARY     Status: Abnormal   Collection Time    01/03/13  8:03 PM      Result Value Range   Glucose-Capillary 45 (*) 70 - 99 mg/dL   Comment 1 Notify RN    GLUCOSE, CAPILLARY      Status: Abnormal   Collection Time    01/03/13  8:27 PM      Result Value Range   Glucose-Capillary 114 (*) 70 - 99 mg/dL  GLUCOSE, CAPILLARY     Status: Abnormal   Collection Time    01/04/13  7:18 AM      Result Value Range   Glucose-Capillary 115 (*) 70 - 99 mg/dL  GLUCOSE, CAPILLARY     Status: Abnormal   Collection Time    01/04/13 11:16 AM      Result Value Range   Glucose-Capillary 191 (*) 70 - 99 mg/dL  CBC WITH DIFFERENTIAL     Status: Abnormal   Collection Time    01/04/13  1:25 PM      Result Value Range   WBC 10.0  4.0 - 10.5 K/uL   Comment: WHITE COUNT CONFIRMED ON SMEAR   RBC 3.25 (*) 3.87 - 5.11 MIL/uL   Hemoglobin 9.2 (*) 12.0 - 15.0 g/dL   HCT 29.5 (*) 36.0 - 46.0 %   MCV 90.8  78.0 - 100.0 fL   MCH 28.3  26.0 - 34.0 pg   MCHC 31.2  30.0 - 36.0 g/dL   RDW 16.9 (*) 11.5 - 15.5 %   Platelets 212  150 - 400 K/uL   Neutrophils Relative  91 (*) 43 - 77 %   Neutro Abs 9.0 (*) 1.7 - 7.7 K/uL   Lymphocytes Relative 7 (*) 12 - 46 %   Lymphs Abs 1.0  0.7 - 4.0 K/uL   Monocytes Relative 2 (*) 3 - 12 %   Monocytes Absolute 0.2  0.1 - 1.0 K/uL   Eosinophils Relative 0  0 - 5 %   Eosinophils Absolute 0.0  0.0 - 0.7 K/uL   Basophils Relative 0  0 - 1 %   Basophils Absolute 0.0  0.0 - 0.1 K/uL   WBC Morphology ATYPICAL LYMPHOCYTES     Comment: HYPERSEGMENTED NEUT   RBC Morphology BASOPHILIC STIPPLING     Smear Review PERFORMED    HEPATIC FUNCTION PANEL     Status: Abnormal   Collection Time    01/04/13  1:25 PM      Result Value Range   Total Protein 6.0  6.0 - 8.3 g/dL   Albumin 2.4 (*) 3.5 - 5.2 g/dL   AST 15  0 - 37 U/L   ALT 18  0 - 35 U/L   Alkaline Phosphatase 121 (*) 39 - 117 U/L   Total Bilirubin 0.4  0.3 - 1.2 mg/dL   Bilirubin, Direct 0.1  0.0 - 0.3 mg/dL   Indirect Bilirubin 0.3  0.3 - 0.9 mg/dL  MAGNESIUM     Status: None   Collection Time    01/04/13  1:25 PM      Result Value Range   Magnesium 1.7  1.5 - 2.5 mg/dL  GLUCOSE, CAPILLARY      Status: Abnormal   Collection Time    01/04/13  4:45 PM      Result Value Range   Glucose-Capillary 110 (*) 70 - 99 mg/dL  GLUCOSE, CAPILLARY     Status: Abnormal   Collection Time    01/04/13  9:04 PM      Result Value Range   Glucose-Capillary 238 (*) 70 - 99 mg/dL   Comment 1 Notify RN    BASIC METABOLIC PANEL     Status: Abnormal   Collection Time    01/05/13  4:31 AM      Result Value Range   Sodium 146 (*) 135 - 145 mEq/L   Potassium 3.7  3.5 - 5.1 mEq/L   Chloride 110  96 - 112 mEq/L   CO2 28  19 - 32 mEq/L   Glucose, Bld 185 (*) 70 - 99 mg/dL   BUN 40 (*) 6 - 23 mg/dL   Creatinine, Ser 0.91  0.50 - 1.10 mg/dL   Calcium 8.1 (*) 8.4 - 10.5 mg/dL   GFR calc non Af Amer 67 (*) >90 mL/min   GFR calc Af Amer 77 (*) >90 mL/min   Comment:            The eGFR has been calculated     using the CKD EPI equation.     This calculation has not been     validated in all clinical     situations.     eGFR's persistently     <90 mL/min signify     possible Chronic Kidney Disease.  GLUCOSE, CAPILLARY     Status: Abnormal   Collection Time    01/05/13  7:43 AM      Result Value Range   Glucose-Capillary 126 (*) 70 - 99 mg/dL   Comment 1 Documented in Chart     Comment 2 Notify RN  Lipid Panel No results found for this basename: CHOL, TRIG, HDL, CHOLHDL, VLDL, LDLCALC,  in the last 72 hours  Studies/Results: No results found.  Medications:  Scheduled Meds: . ipratropium  0.5 mg Nebulization Q4H   And  . albuterol  2.5 mg Nebulization Q4H  . ARIPiprazole  5 mg Oral Daily  . aspirin  81 mg Oral Daily  . azithromycin  500 mg Intravenous Q24H  . benztropine  0.5 mg Oral Daily  . ceFEPime (MAXIPIME) IV  2 g Intravenous Q12H  . colesevelam  1,875 mg Oral BID WC  . DULoxetine  40 mg Oral BID  . enoxaparin (LOVENOX) injection  40 mg Subcutaneous Daily  . furosemide  40 mg Intravenous Daily  . insulin aspart  0-9 Units Subcutaneous TID WC  . insulin glargine  12 Units  Subcutaneous QHS  . levothyroxine  200 mcg Oral QAC breakfast  . lisinopril  20 mg Oral Daily  . methylPREDNISolone sodium succinate  125 mg Intravenous Q8H  . metoprolol  25 mg Oral BID  . niacin  1,000 mg Oral QHS  . omega-3 acid ethyl esters  2 g Oral Daily  . pantoprazole  80 mg Oral Daily  . simvastatin  20 mg Oral q1800  . traZODone  100 mg Oral QHS  . vancomycin  1,250 mg Intravenous Q24H   Continuous Infusions: . sodium chloride 75 mL/hr at 01/05/13 0500   PRN Meds:.albuterol, LORazepam, nitroGLYCERIN, sodium chloride, traMADol    LOS: 10 days   Jenifer Struve A. Merlene Laughter, M.D.  Diplomate, Tax adviser of Psychiatry and Neurology ( Neurology).

## 2013-01-05 NOTE — Progress Notes (Signed)
   Pt not seen today due to pt not following commands.  When therapist entered room pt had taken her O2 off.  Therapist replaced O2 and attempted to work with pt but pt was not following commands and was not speaking coherently.  We will attempt to see pt for an evaluation on 01/06/2013.

## 2013-01-05 NOTE — Progress Notes (Signed)
Subjective: She is overall about the same. She is confused and agitated.  Objective: Vital signs in last 24 hours: Temp:  [97.7 F (36.5 C)-98.8 F (37.1 C)] 98.1 F (36.7 C) (03/24 0746) Pulse Rate:  [49-89] 83 (03/24 0500) Resp:  [13-24] 21 (03/24 0500) BP: (113-177)/(39-133) 155/131 mmHg (03/24 0400) SpO2:  [90 %-100 %] 95 % (03/24 0659) Weight:  [78.4 kg (172 lb 13.5 oz)] 78.4 kg (172 lb 13.5 oz) (03/24 0500) Weight change: -1.5 kg (-3 lb 4.9 oz) Last BM Date: 01/02/13  Intake/Output from previous day: 03/23 0701 - 03/24 0700 In: 3330 [P.O.:1080; I.V.:1650; IV Piggyback:600] Out: 2600 [Urine:2600]  PHYSICAL EXAM General appearance: alert, moderate distress and Agitated Resp: rhonchi bilaterally Cardio: regular rate and rhythm, S1, S2 normal, no murmur, click, rub or gallop GI: soft, non-tender; bowel sounds normal; no masses,  no organomegaly Extremities: extremities normal, atraumatic, no cyanosis or edema  Lab Results:    Basic Metabolic Panel:  Recent Labs  01/04/13 1325 01/05/13 0431  NA  --  146*  K  --  3.7  CL  --  110  CO2  --  28  GLUCOSE  --  185*  BUN  --  40*  CREATININE  --  0.91  CALCIUM  --  8.1*  MG 1.7  --    Liver Function Tests:  Recent Labs  01/04/13 1325  AST 15  ALT 18  ALKPHOS 121*  BILITOT 0.4  PROT 6.0  ALBUMIN 2.4*   No results found for this basename: LIPASE, AMYLASE,  in the last 72 hours No results found for this basename: AMMONIA,  in the last 72 hours CBC:  Recent Labs  01/04/13 1325  WBC 10.0  NEUTROABS 9.0*  HGB 9.2*  HCT 29.5*  MCV 90.8  PLT 212   Cardiac Enzymes: No results found for this basename: CKTOTAL, CKMB, CKMBINDEX, TROPONINI,  in the last 72 hours BNP: No results found for this basename: PROBNP,  in the last 72 hours D-Dimer: No results found for this basename: DDIMER,  in the last 72 hours CBG:  Recent Labs  01/03/13 2003 01/03/13 2027 01/04/13 0718 01/04/13 1116 01/04/13 1645  01/04/13 2104  GLUCAP 45* 114* 115* 191* 110* 238*   Hemoglobin A1C: No results found for this basename: HGBA1C,  in the last 72 hours Fasting Lipid Panel: No results found for this basename: CHOL, HDL, LDLCALC, TRIG, CHOLHDL, LDLDIRECT,  in the last 72 hours Thyroid Function Tests: No results found for this basename: TSH, T4TOTAL, FREET4, T3FREE, THYROIDAB,  in the last 72 hours Anemia Panel: No results found for this basename: VITAMINB12, FOLATE, FERRITIN, TIBC, IRON, RETICCTPCT,  in the last 72 hours Coagulation: No results found for this basename: LABPROT, INR,  in the last 72 hours Urine Drug Screen: Drugs of Abuse  No results found for this basename: labopia, cocainscrnur, labbenz, amphetmu, thcu, labbarb    Alcohol Level: No results found for this basename: ETH,  in the last 72 hours Urinalysis: No results found for this basename: COLORURINE, APPERANCEUR, LABSPEC, PHURINE, GLUCOSEU, HGBUR, BILIRUBINUR, KETONESUR, PROTEINUR, UROBILINOGEN, NITRITE, LEUKOCYTESUR,  in the last 72 hours Misc. Labs:  ABGS No results found for this basename: PHART, PCO2, PO2ART, TCO2, HCO3,  in the last 72 hours CULTURES Recent Results (from the past 240 hour(s))  URINE CULTURE     Status: None   Collection Time    12/26/12  2:37 PM      Result Value Range Status   Specimen  Description URINE, CATHETERIZED   Final   Special Requests NONE   Final   Culture  Setup Time 12/27/2012 05:01   Final   Colony Count NO GROWTH   Final   Culture NO GROWTH   Final   Report Status 12/28/2012 FINAL   Final  CULTURE, BLOOD (ROUTINE X 2)     Status: None   Collection Time    12/26/12  2:55 PM      Result Value Range Status   Specimen Description BLOOD LEFT ARM   Final   Special Requests BOTTLES DRAWN AEROBIC ONLY 6CC   Final   Culture NO GROWTH 5 DAYS   Final   Report Status 12/31/2012 FINAL   Final  CULTURE, BLOOD (ROUTINE X 2)     Status: None   Collection Time    12/26/12  3:10 PM      Result Value  Range Status   Specimen Description BLOOD RIGHT ARM   Final   Special Requests BOTTLES DRAWN AEROBIC ONLY 6CC   Final   Culture NO GROWTH 5 DAYS   Final   Report Status 12/31/2012 FINAL   Final  MRSA PCR SCREENING     Status: None   Collection Time    12/31/12  8:30 PM      Result Value Range Status   MRSA by PCR NEGATIVE  NEGATIVE Final   Comment:            The GeneXpert MRSA Assay (FDA     approved for NASAL specimens     only), is one component of a     comprehensive MRSA colonization     surveillance program. It is not     intended to diagnose MRSA     infection nor to guide or     monitor treatment for     MRSA infections.   Studies/Results: No results found.  Medications:  Scheduled: . ipratropium  0.5 mg Nebulization Q4H   And  . albuterol  2.5 mg Nebulization Q4H  . ARIPiprazole  5 mg Oral Daily  . aspirin  81 mg Oral Daily  . azithromycin  500 mg Intravenous Q24H  . benztropine  0.5 mg Oral Daily  . ceFEPime (MAXIPIME) IV  2 g Intravenous Q12H  . colesevelam  1,875 mg Oral BID WC  . DULoxetine  40 mg Oral BID  . enoxaparin (LOVENOX) injection  40 mg Subcutaneous Daily  . furosemide  40 mg Intravenous Daily  . insulin aspart  0-9 Units Subcutaneous TID WC  . insulin glargine  12 Units Subcutaneous QHS  . levothyroxine  200 mcg Oral QAC breakfast  . lisinopril  20 mg Oral Daily  . methylPREDNISolone sodium succinate  125 mg Intravenous Q8H  . metoprolol  25 mg Oral BID  . niacin  1,000 mg Oral QHS  . omega-3 acid ethyl esters  2 g Oral Daily  . pantoprazole  80 mg Oral Daily  . simvastatin  20 mg Oral q1800  . traZODone  100 mg Oral QHS  . vancomycin  1,250 mg Intravenous Q24H   Continuous: . sodium chloride 75 mL/hr at 01/05/13 0500   TOI:[id number], LORazepam, nitroGLYCERIN, sodium chloride, traMADol  Assesment: She was admitted with post influenza pneumonia and does seem to be improving. She has had delirium related to her medical illness and that's  about the same. She has COPD which of course is worse with her pneumonia Principal Problem:   Pneumonia due to infectious agent Active  Problems:   DIABETES MELLITUS, TYPE II, CONTROLLED   HYPERLIPIDEMIA   ANXIETY STATE NOS   DEPRESSION   COPD    Plan: Continue IV antibiotics. She is approaching the point that we can discontinue this.    LOS: 10 days   Cantrell Martus L 01/05/2013, 7:54 AM

## 2013-01-05 NOTE — Progress Notes (Signed)
Report given to T. Chrissie Noa, RN. Patient being transferred to dept 300 on tele and oxygen in wheelchair by NS and NT. Patient confused but in stable condition at the time of transport. Patient's belonging sent with her.

## 2013-01-05 NOTE — Progress Notes (Signed)
ANTIBIOTIC CONSULT NOTE  Pharmacy Consult for Vancomycin Indication: rule out pneumonia  Allergies  Allergen Reactions  . Ciprofloxacin     REACTION: Rash  . Iohexol      Desc: CHEST TIGHTNESS,BRETHING PROBLEMS NEEDS PRE MEDS   . Neomycin-Bacitracin Zn-Polymyx     REACTION: Rash  . Penicillins     REACTION: Rash  . Povidone     REACTION: Rash   Patient Measurements: Height: 5' 6"  (167.6 cm) Weight: 172 lb 13.5 oz (78.4 kg) IBW/kg (Calculated) : 59.3  Vital Signs: Temp: 98.1 F (36.7 C) (03/24 0746) Temp src: Oral (03/24 0746) BP: 155/131 mmHg (03/24 0400) Pulse Rate: 83 (03/24 0500) Intake/Output from previous day: 03/23 0701 - 03/24 0700 In: 3330 [P.O.:1080; I.V.:1650; IV Piggyback:600] Out: 2600 [Urine:2600] Intake/Output from this shift: Total I/O In: 120 [P.O.:120] Out: -   Labs:  Recent Labs  01/04/13 1325 01/05/13 0431  WBC 10.0  --   HGB 9.2*  --   PLT 212  --   CREATININE  --  0.91   Estimated Creatinine Clearance: 68.6 ml/min (by C-G formula based on Cr of 0.91). No results found for this basename: VANCOTROUGH, Corlis Leak, VANCORANDOM, [place], GENTPEAK, GENTRANDOM, TOBRATROUGH, TOBRAPEAK, TOBRARND, AMIKACINPEAK, AMIKACINTROU, AMIKACIN,  in the last 72 hours  Microbiology: Recent Results (from the past 720 hour(s))  URINE CULTURE     Status: None   Collection Time    12/26/12  2:37 PM      Result Value Range Status   Specimen Description URINE, CATHETERIZED   Final   Special Requests NONE   Final   Culture  Setup Time 12/27/2012 05:01   Final   Colony Count NO GROWTH   Final   Culture NO GROWTH   Final   Report Status 12/28/2012 FINAL   Final  CULTURE, BLOOD (ROUTINE X 2)     Status: None   Collection Time    12/26/12  2:55 PM      Result Value Range Status   Specimen Description BLOOD LEFT ARM   Final   Special Requests BOTTLES DRAWN AEROBIC ONLY 6CC   Final   Culture NO GROWTH 5 DAYS   Final   Report Status 12/31/2012 FINAL   Final   CULTURE, BLOOD (ROUTINE X 2)     Status: None   Collection Time    12/26/12  3:10 PM      Result Value Range Status   Specimen Description BLOOD RIGHT ARM   Final   Special Requests BOTTLES DRAWN AEROBIC ONLY [place]   Final   Culture NO GROWTH 5 DAYS   Final   Report Status 12/31/2012 FINAL   Final  MRSA PCR SCREENING     Status: None   Collection Time    12/31/12  8:30 PM      Result Value Range Status   MRSA by PCR NEGATIVE  NEGATIVE Final   Comment:            The GeneXpert MRSA Assay (FDA     approved for NASAL specimens     only), is one component of a     comprehensive MRSA colonization     surveillance program. It is not     intended to diagnose MRSA     infection nor to guide or     monitor treatment for     MRSA infections.   Medical History: Past Medical History  Diagnosis Date  . Diabetes mellitus   . COPD (chronic obstructive pulmonary  disease)   . ASCVD (arteriosclerotic cardiovascular disease)     MI in 96 requiring BMS CX; DES to M1 in 2000;normal coronary angiography in 2004  . Hyperlipidemia   . Hypothyroidism   . Tobacco abuse   . Depression   . DVT (deep venous thrombosis)   . Nephrolithiasis 2006    stone extraction   . Amputation of hand, right     traumatic  . Allergic rhinitis   . Cholelithiasis   . GERD (gastroesophageal reflux disease)   . Low back pain   . Peripheral neuropathy   . Diabetes mellitus type I   . DDD (degenerative disc disease), lumbar   . Sciatic pain     right  . Tremor    Medications:  Scheduled:  . ipratropium  0.5 mg Nebulization Q4H   And  . albuterol  2.5 mg Nebulization Q4H  . ARIPiprazole  5 mg Oral Daily  . aspirin  81 mg Oral Daily  . azithromycin  500 mg Intravenous Q24H  . benztropine  0.5 mg Oral Daily  . ceFEPime (MAXIPIME) IV  2 g Intravenous Q12H  . colesevelam  1,875 mg Oral BID WC  . DULoxetine  40 mg Oral BID  . enoxaparin (LOVENOX) injection  40 mg Subcutaneous Daily  . furosemide  40 mg  Intravenous Daily  . insulin aspart  0-9 Units Subcutaneous TID WC  . insulin glargine  12 Units Subcutaneous QHS  . levothyroxine  200 mcg Oral QAC breakfast  . lisinopril  20 mg Oral Daily  . methylPREDNISolone sodium succinate  125 mg Intravenous Q8H  . metoprolol  25 mg Oral BID  . niacin  1,000 mg Oral QHS  . omega-3 acid ethyl esters  2 g Oral Daily  . pantoprazole  80 mg Oral Daily  . simvastatin  20 mg Oral q1800  . traZODone  100 mg Oral QHS  . vancomycin  1,250 mg Intravenous Q24H  . [DISCONTINUED] insulin aspart  12 Units Subcutaneous TID WC  . [DISCONTINUED] insulin aspart  4 Units Subcutaneous TID WC  . [DISCONTINUED] insulin glargine  35 Units Subcutaneous QHS   Assessment: 62 yo F admitted with COPD exacerbation and CAP.  She is on day#10 Vancomycin, Zithromax, and day#6 Cefepime.   Renal function has been stable.  She is afebrile with normal WBC.  Cx data has been normal to date. Vancomycin trough was slightly less than goal on 3/18 and dose was increased.  Goal of Therapy:  Vancomycin trough level 15-20 mcg/ml  Plan:  Vancomycin 1264m IV every 24 hours. Check weekly Vancomycin trough level (tomorrow) Cefepime 2gm IV Q12h  Zithromax 5056mIV Q24h Monitor renal function and cx data  Duration of therapy per MD  HaHart Robinsons 01/05/2013,10:45 AM

## 2013-01-05 NOTE — Progress Notes (Signed)
[name], [name] NO.:  [id number]  MEDICAL RECORD NO.:  [id number]  LOCATION:                                 FACILITY:  PHYSICIAN:  Unk Lightning, MDDATE OF BIRTH:  [birth year]  DATE OF PROCEDURE:  12/31/2012 DATE OF DISCHARGE:                                PROGRESS NOTE   The patient has multilobar pneumonia, has influenza, currently on Tamiflu and 3 antibiotics and aggressive nebulizer therapy as well as steroids.  She has mild-to-moderate inspiratory and expiratory wheezing, scattered rhonchi, diminished breath sounds at the bases today which is an improvement.  She is alert.  She is sitting in bed.  She actually answers questions.  She also has improved sensorium.  Blood pressure 163/79, temperature 97.3, pulse 71 and regular, respiratory rate is 20. Hemoglobin 9.4 on the 18 and creatinine is 1.23 somewhat improved. Awaiting stools for occult blood.  She is iron deficient.  I do not want to add iron to interfere with stools for Hemocculting.  Heart, regular rhythm.  No S3, S4.  No heaves, thrills, or rubs.  Abdomen is soft, nontender.  Bowel sounds normoactive.  PLAN:  Right now is to continue vancomycin, IV Solu-Medrol, Rocephin, as well as Zithromax, and ambulate patient and see how she does.  We will check CBC in the morning and stools for occult blood before adding iron.     Unk Lightning, MD     RMD/MEDQ  D:  12/31/2012  T:  12/31/2012  Job:  [id number]

## 2013-01-05 NOTE — Progress Notes (Signed)
[id number] 

## 2013-01-06 LAB — BASIC METABOLIC PANEL
BUN: 32 mg/dL — ABNORMAL HIGH (ref 6–23)
Calcium: 8.3 mg/dL — ABNORMAL LOW (ref 8.4–10.5)
Creatinine, Ser: 0.85 mg/dL (ref 0.50–1.10)
GFR calc Af Amer: 84 mL/min — ABNORMAL LOW (ref >90)
GFR calc non Af Amer: 72 mL/min — ABNORMAL LOW (ref >90)
Glucose, Bld: 195 mg/dL — ABNORMAL HIGH (ref 70–99)
Potassium: 3.7 mEq/L (ref 3.5–5.1)

## 2013-01-06 LAB — GLUCOSE, CAPILLARY
Glucose-Capillary: 208 mg/dL — ABNORMAL HIGH (ref 70–99)
Glucose-Capillary: 237 mg/dL — ABNORMAL HIGH (ref 70–99)

## 2013-01-06 MED ORDER — PRO-STAT SUGAR FREE PO LIQD
30.0000 mL | Freq: Three times a day (TID) | ORAL | Status: DC
  Administered 2013-01-06 – 2013-01-08 (×6): 30 mL via ORAL
  Filled 2013-01-06 (×6): qty 30

## 2013-01-06 MED ORDER — ENSURE COMPLETE PO LIQD
237.0000 mL | Freq: Two times a day (BID) | ORAL | Status: DC
  Administered 2013-01-06 – 2013-01-08 (×4): 237 mL via ORAL

## 2013-01-06 NOTE — Progress Notes (Signed)
Subjective: I think she is overall about the same. No new complaints have been noted. She remained somewhat confused and occasionally combative  Objective: Vital signs in last 24 hours: Temp:  [98.3 F (36.8 C)-98.9 F (37.2 C)] 98.7 F (37.1 C) (03/25 0608) Pulse Rate:  [78-95] 91 (03/25 0608) Resp:  [18-22] 20 (03/25 0608) BP: (158-171)/(58-80) 167/67 mmHg (03/25 0608) SpO2:  [90 %-96 %] 90 % (03/25 0608) Weight change:  Last BM Date: 01/02/13  Intake/Output from previous day: 03/24 0701 - 03/25 0700 In: 920 [P.O.:120; I.V.:450; IV Piggyback:350] Out: 3300 [Urine:3300]  PHYSICAL EXAM General appearance: alert, moderate distress and Confused Resp: rhonchi bilaterally Cardio: regular rate and rhythm, S1, S2 normal, no murmur, click, rub or gallop GI: soft, non-tender; bowel sounds normal; no masses,  no organomegaly Extremities: extremities normal, atraumatic, no cyanosis or edema  Lab Results:    Basic Metabolic Panel:  Recent Labs  01/04/13 1325 01/05/13 0431 01/06/13 0509  NA  --  146* 144  K  --  3.7 3.7  CL  --  110 108  CO2  --  28 27  GLUCOSE  --  185* 195*  BUN  --  40* 32*  CREATININE  --  0.91 0.85  CALCIUM  --  8.1* 8.3*  MG 1.7  --   --    Liver Function Tests:  Recent Labs  01/04/13 1325  AST 15  ALT 18  ALKPHOS 121*  BILITOT 0.4  PROT 6.0  ALBUMIN 2.4*   No results found for this basename: LIPASE, AMYLASE,  in the last 72 hours No results found for this basename: AMMONIA,  in the last 72 hours CBC:  Recent Labs  01/04/13 1325  WBC 10.0  NEUTROABS 9.0*  HGB 9.2*  HCT 29.5*  MCV 90.8  PLT 212   Cardiac Enzymes: No results found for this basename: CKTOTAL, CKMB, CKMBINDEX, TROPONINI,  in the last 72 hours BNP: No results found for this basename: PROBNP,  in the last 72 hours D-Dimer: No results found for this basename: DDIMER,  in the last 72 hours CBG:  Recent Labs  01/04/13 2104 01/05/13 0743 01/05/13 1208  01/05/13 1654 01/05/13 2104 01/06/13 0723  GLUCAP 238* 126* 145* 97 118* 173*   Hemoglobin A1C: No results found for this basename: HGBA1C,  in the last 72 hours Fasting Lipid Panel: No results found for this basename: CHOL, HDL, LDLCALC, TRIG, CHOLHDL, LDLDIRECT,  in the last 72 hours Thyroid Function Tests: No results found for this basename: TSH, T4TOTAL, FREET4, T3FREE, THYROIDAB,  in the last 72 hours Anemia Panel: No results found for this basename: VITAMINB12, FOLATE, FERRITIN, TIBC, IRON, RETICCTPCT,  in the last 72 hours Coagulation: No results found for this basename: LABPROT, INR,  in the last 72 hours Urine Drug Screen: Drugs of Abuse  No results found for this basename: labopia, cocainscrnur, labbenz, amphetmu, thcu, labbarb    Alcohol Level: No results found for this basename: ETH,  in the last 72 hours Urinalysis: No results found for this basename: COLORURINE, APPERANCEUR, LABSPEC, PHURINE, GLUCOSEU, HGBUR, BILIRUBINUR, KETONESUR, PROTEINUR, UROBILINOGEN, NITRITE, LEUKOCYTESUR,  in the last 72 hours Misc. Labs:  ABGS No results found for this basename: PHART, PCO2, PO2ART, TCO2, HCO3,  in the last 72 hours CULTURES Recent Results (from the past 240 hour(s))  MRSA PCR SCREENING     Status: None   Collection Time    12/31/12  8:30 PM      Result Value Range Status  MRSA by PCR NEGATIVE  NEGATIVE Final   Comment:            The GeneXpert MRSA Assay (FDA     approved for NASAL specimens     only), is one component of a     comprehensive MRSA colonization     surveillance program. It is not     intended to diagnose MRSA     infection nor to guide or     monitor treatment for     MRSA infections.   Studies/Results: Dg Chest Port 1 View  01/05/2013  *RADIOLOGY REPORT*  Clinical Data: Pneumonia.  Altered mental status.  PORTABLE CHEST - 1 VIEW  Comparison: Chest 01/02/2013 and 12/31/2012.  Findings: Diffuse bilateral airspace disease persists and appears  slightly worsened compared to the most recent study.  Heart size is upper normal.  No pneumothorax or pleural effusion.  IMPRESSION: Some worsening in diffuse bilateral airspace disease consistent with multifocal pneumonia and/or pulmonary edema.   Original Report Authenticated By: Orlean Patten, M.D.     Medications:  Prior to Admission:  Prescriptions prior to admission  Medication Sig Dispense Refill  . ARIPiprazole (ABILIFY) 5 MG tablet Take 1 tablet (5 mg total) by mouth daily.  30 tablet  1  . aspirin 81 MG chewable tablet Chew 81 mg by mouth daily.      . beclomethasone (QVAR) 40 MCG/ACT inhaler Inhale 2 puffs into the lungs 2 (two) times daily.        . benztropine (COGENTIN) 0.5 MG tablet TAKE 1 TABLET (0.5 MG TOTAL)  BY MOUTH DAILY.  30 tablet  0  . clindamycin (CLEOCIN) 300 MG capsule Take 300 mg by mouth 3 (three) times daily.       . clonazePAM (KLONOPIN) 0.5 MG tablet Take 0.5-1 mg by mouth 2 (two) times daily. Take 1 in am and 2 at bed time      . colesevelam (WELCHOL) 625 MG tablet Take 1,875 mg by mouth 2 (two) times daily with a meal.        . DULoxetine (CYMBALTA) 20 MG capsule Take 2 capsules (40 mg total) by mouth 2 (two) times daily.  120 capsule  1  . esomeprazole (NEXIUM) 40 MG capsule Take 40 mg by mouth daily before breakfast.        . fish oil-omega-3 fatty acids 1000 MG capsule Take 2 g by mouth daily.        Marland Kitchen gabapentin (NEURONTIN) 100 MG capsule Take 1 capsule by mouth 3 (three) times daily.      Marland Kitchen glyBURIDE (DIABETA) 5 MG tablet Take 5 mg by mouth 2 (two) times daily.       Marland Kitchen levothyroxine (SYNTHROID, LEVOTHROID) 200 MCG tablet Take 400 mcg by mouth daily.      Marland Kitchen lisinopril (PRINIVIL,ZESTRIL) 20 MG tablet Take 20 mg by mouth daily.        . metFORMIN (GLUCOPHAGE) 500 MG tablet Take 500 mg by mouth 2 (two) times daily.      . metoprolol (LOPRESSOR) 50 MG tablet Take 25 mg by mouth 2 (two) times daily.       . naproxen (NAPROSYN) 500 MG tablet Take 500 mg by  mouth 2 (two) times daily.       . niacin (NIASPAN) 1000 MG CR tablet Take 1,000 mg by mouth at bedtime.        . nitroGLYCERIN (NITROSTAT) 0.4 MG SL tablet Place 0.4 mg under the tongue every 5 (five)  minutes as needed. Chest pain      . pravastatin (PRAVACHOL) 40 MG tablet Take 40 mg by mouth daily.        . traZODone (DESYREL) 100 MG tablet Take 1 tablet (100 mg total) by mouth at bedtime.  30 tablet  1   Scheduled: . ipratropium  0.5 mg Nebulization Q4H   And  . albuterol  2.5 mg Nebulization Q4H  . ARIPiprazole  5 mg Oral Daily  . aspirin  81 mg Oral Daily  . azithromycin  500 mg Intravenous Q24H  . benztropine  0.5 mg Oral Daily  . ceFEPime (MAXIPIME) IV  2 g Intravenous Q12H  . colesevelam  1,875 mg Oral BID WC  . DULoxetine  40 mg Oral BID  . enoxaparin (LOVENOX) injection  40 mg Subcutaneous Daily  . furosemide  40 mg Intravenous Daily  . insulin aspart  0-9 Units Subcutaneous TID WC  . insulin glargine  12 Units Subcutaneous QHS  . iron polysaccharides  150 mg Oral Daily  . levothyroxine  200 mcg Oral QAC breakfast  . lisinopril  20 mg Oral Daily  . methylPREDNISolone sodium succinate  80 mg Intravenous Q8H  . metoprolol  25 mg Oral BID  . niacin  1,000 mg Oral QHS  . omega-3 acid ethyl esters  2 g Oral Daily  . pantoprazole  80 mg Oral Daily  . simvastatin  20 mg Oral q1800  . traZODone  100 mg Oral QHS  . vancomycin  1,250 mg Intravenous Q24H   Continuous: . sodium chloride 75 mL/hr (01/05/13 1415)   [id number], LORazepam, magnesium hydroxide, nitroGLYCERIN, sodium chloride, traMADol  Assesment: She has pneumonia following influenza. Her chest x-ray yesterday looked a little bit worse. Clinically she is better. She has encephalopathy and I think that's a little bit better. Principal Problem:   Pneumonia due to infectious agent Active Problems:   DIABETES MELLITUS, TYPE II, CONTROLLED   HYPERLIPIDEMIA   ANXIETY STATE NOS   DEPRESSION   COPD    Plan:  Although I had originally thought that we could move toward discontinuing her antibiotics I think we should probably continue for 14 days considering the changes on chest x-ray    LOS: 11 days   Aleyda Gindlesperger L 01/06/2013, 8:35 AM

## 2013-01-06 NOTE — Progress Notes (Signed)
[id number] 

## 2013-01-06 NOTE — Progress Notes (Signed)
UR Chart Review Completed  

## 2013-01-06 NOTE — Evaluation (Signed)
Physical Therapy Evaluation Patient Details Name: [name] MRN: [id number] DOB: 1951/08/08 Today's Date: 01/06/2013 Time: 5176-1607 [name] Time Calculation (min): 32 min  [name] Assessment / Plan / Recommendation Clinical Impression  [name] was seen for evaluation.  She was alert but very confused, agreeable to working with me.  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  She has had multiple falls at home and has also had progressive confusion.  Currently the [name] can only follow about 50% of directions.  I could not evaluate her strength due to confusion. She required mod to mas assist to transfer to EOB and max assist to ambulate 4' with hand held assist.  Her standing balance is poor and she has an ataxic gait with very narrow base of support.   She presents a very high fall risk.  I would recommend SNF at d/c    [name] Assessment  Patient needs continued [name] services    Follow Up Recommendations  SNF    Does the patient have the potential to tolerate intense rehabilitation      Barriers to Discharge None      Equipment Recommendations  None recommended by [name]    Recommendations for Other Services     Frequency Min 3X/week    Precautions / Restrictions Precautions Precautions: Fall Restrictions Weight Bearing Restrictions: No   Pertinent Vitals/Pain       Mobility  Bed Mobility Bed Mobility: Supine to Sit;Sit to Supine Supine to Sit: 2: Max assist;HOB elevated Sit to Supine: 3: Mod assist;HOB elevated Transfers Transfers: Sit to Stand;Stand to Sit Sit to Stand: 3: Mod assist;With upper extremity assist Stand to Sit: 3: Mod assist;Without upper extremity assist Ambulation/Gait Ambulation/Gait Assistance: 2: Max assist Ambulation Distance (Feet): 4 Feet Assistive device: 1 person hand held assist Gait Pattern: Ataxic;Narrow base of support Gait velocity:  slow General Gait Details: [name] is unable to correct gait deviations due to difficulty following directions Stairs: No Wheelchair Mobility Wheelchair Mobility: No    Exercises     [name] Diagnosis: Difficulty walking;Abnormality of gait;Generalized weakness  [name] Problem List: Decreased strength;Decreased activity tolerance;Decreased balance;Decreased mobility;Decreased cognition;Decreased knowledge of use of DME;Decreased safety awareness;Cardiopulmonary status limiting activity [name] Treatment Interventions: Gait training;Functional mobility training;Therapeutic activities;Patient/family education   [name] Goals Acute Rehab [name] Goals [name] Goal Formulation: With patient Time For Goal Achievement: 01/13/13 Potential to Achieve Goals: Poor [name] will go Supine/Side to Sit: with min assist;with HOB not 0 degrees (comment degree) [name] Goal: Supine/Side to Sit - Progress: Goal set today [name] will go Sit to Supine/Side: with min assist;with HOB not 0 degrees (comment degree) [name] Goal: Sit to Supine/Side - Progress: Goal set today [name] will go Sit to Stand: with min assist [name] Goal: Sit to Stand - Progress: Goal set today [name] will go Stand to Sit: with min assist [name] Goal: Stand to Sit - Progress: Goal set today [name] will Ambulate: 1 - 15 feet;with other equipment (comment);with mod assist (try walker with R platform) [name] Goal: Ambulate - Progress: Goal set today  Visit Information  Last [name] Received On: 01/06/13    Subjective Data  Subjective: [name] speaking nonsensically...she reports that she lives alone whereupon her husband entered the room...he was able to give hx Patient Stated Goal: husbanc would like to take her home   Prior [place] With:  Spouse Available Help at Discharge: Family Type of Home: House Home Access: Ramped entrance Home Layout: One level Bathroom Shower/Tub: Multimedia programmer: Standard Home Adaptive Equipment: Bedside commode/3-in-1;Walker - rolling;Straight  cane Prior Function Level of Independence: Needs assistance Needs Assistance: Bathing;Dressing;Feeding;Grooming;Meal Prep;Light Housekeeping Bath: Minimal Dressing: Minimal Feeding: Minimal Grooming: Minimal Meal Prep: Total Light Housekeeping: Total Able to Take Stairs?: No Driving: Yes Vocation: Unemployed Communication Communication:  (speech is mumbled) Dominant Hand:  (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 hand due to amputation)    Cognition  Cognition Overall Cognitive Status: History of cognitive impairments - further impaired Area of Impairment: Attention;Memory;Following commands;Safety/judgement;Awareness of errors;Awareness of deficits;Problem solving;Executive functioning Arousal/Alertness: Awake/alert Orientation Level: Disoriented X4;Time Behavior During Session: Agitated Current Attention Level: Divided Memory:  (no recall of her hx) Following Commands: Follows one step commands inconsistently Safety/Judgement: Decreased awareness of safety precautions Awareness of Errors: Assistance required to identify errors made    Extremity/Trunk Assessment Right Lower Extremity Assessment RLE ROM/Strength/Tone: Unable to fully assess;Due to impaired cognition Left Lower Extremity Assessment LLE ROM/Strength/Tone: Unable to fully assess;Due to impaired cognition   Balance Balance Balance Assessed: Yes Static Standing Balance Static Standing - Balance Support: Bilateral upper extremity supported Static Standing - Level of Assistance: 4: Min assist (falls backward) Dynamic Standing Balance Dynamic Standing - Balance Support: Right upper extremity supported Dynamic Standing - Level of Assistance: 2: Max assist  End of Session [name] - End of Session Equipment Utilized During Treatment: Gait belt;Oxygen Activity Tolerance: Patient limited by fatigue;Treatment limited secondary to agitation Patient left: in bed;with call bell/phone within reach;with bed alarm set;with family/visitor present Nurse  Communication: Mobility status  GP     Sable Feil 01/06/2013, 11:27 AM

## 2013-01-06 NOTE — Clinical Social Work Placement (Signed)
     Clinical Social Work Department CLINICAL SOCIAL WORK PLACEMENT NOTE 01/09/2013  Patient:  [name], [name]  Account Number:  [id number] Admit date:  12/26/2012  Clinical Social Worker:  Edwyna Shell, CLINICAL SOCIAL WORKER  Date/time:  01/06/2013 11:00 AM  Clinical Social Work is seeking post-discharge placement for this patient at the following level of care:   [facility]   (*CSW will update this form in Epic as items are completed)   01/06/2013  Patient/family provided with [facility] Department of Clinical Social Works list of facilities offering this level of care within the geographic area requested by the patient (or if unable, by the patients family).  01/06/2013  Patient/family informed of their freedom to choose among providers that offer the needed level of care, that participate in Medicare, Medicaid or managed care program needed by the patient, have an available bed and are willing to accept the patient.  01/06/2013  Patient/family informed of [facility] ownership interest in [facility], as well as of the fact that they are under no obligation to receive care at this facility.  PASARR submitted to EDS on 01/06/2013 PASARR number received from EDS on   FL2 transmitted to all facilities in geographic area requested by pt/family on  01/06/2013 FL2 transmitted to all facilities within larger geographic area on   Patient informed that his/her managed care company has contracts with or will negotiate with  certain facilities, including the following:     Patient/family informed of bed offers received:  01/07/2013 Patient chooses bed at [facility] Physician recommends and patient chooses bed at    Patient to be transferred to [facility] on  01/08/2013 Patient to be transferred to facility by [facility]  The following physician request were entered in Epic:   Additional Comments: Patients husband states that they cannot  afford copays required for SNF placement.  Per [facility] patient has met 2014 deductible and husband agrees to admission

## 2013-01-06 NOTE — Progress Notes (Addendum)
INITIAL NUTRITION ASSESSMENT  DOCUMENTATION CODES Per approved criteria  -Not Applicable   INTERVENTION:  Ensure Complete po BID, each supplement provides 350 kcal and 13 grams of protein.  ProStat 30 ml BID  Staff please assist with meals   RD will continue to follow for nutrition needs  NUTRITION DIAGNOSIS: Inadequate oral intake related to pneumonia, confusion  as evidenced by 0% intake recent meals.   Goal: Pt to meet >/= 90% of their estimated nutrition needs; not met  Monitor:  Po intake, labs and wt trends  Reason for Assessment: poor po intake   62 y.o. female  Admitting Dx: Pneumonia due to infectious agent  ASSESSMENT: Pt limited response to questioning. Husband not present currently. Her lunch is here and untouched. Very poor po intake 0% of recent meals likely related to acute illness. Wt loss since admission 12/26/12;  7%, 13# (5.9 kg) which is clinically significant. Wt loss likely multifactorial diuretics and inadequate po's. CBG's are trending up 118-208 mg/dl. Skin breakdown noted to buttocks stage II.   Pt has Povidone allergy which is present in oral supplements. Reviewed with pharmacy and talked with husband by telephone. Husband is not aware of pt having any reaction to drinking oral supplements and says that she has been eating poorly since "she got sick". She normally feeds herself but would benefit from assistance given her acute illness and confusion.  Pt is at risk for malnutrition given her poor protein-energy intake and increased needs with both PNA and pressure ulcer.  Height: Ht Readings from Last 1 Encounters:  12/31/12 5' 6"  (1.676 m)    Weight: Wt Readings from Last 1 Encounters:  01/05/13 172 lb 13.5 oz (78.4 kg)    Ideal Body Weight: 130# (59 kg)  % Ideal Body Weight: 133%  Wt Readings from Last 10 Encounters:  01/05/13 172 lb 13.5 oz (78.4 kg)  10/02/12 186 lb 6.4 oz (84.55 kg)  09/02/12 188 lb (85.276 kg)  07/06/12 190 lb  (86.183 kg)  05/06/12 190 lb (86.183 kg)  03/06/12 190 lb (86.183 kg)  01/03/12 196 lb (88.905 kg)  11/06/11 193 lb (87.544 kg)  09/04/10 206 lb (93.441 kg)  08/22/10 202 lb (91.627 kg)    Usual Body Weight: 186-188#  % Usual Body Weight: 93%  BMI:  Body mass index is 27.91 kg/(m^2).Overweight  Estimated Nutritional Needs: Kcal: 5053-9767 Protein: 78-88 gr Fluid: >1800 ml/day  Skin: stage II to buttocks 01/01/13  Diet Order: Sodium Restricted  EDUCATION NEEDS: -Education not appropriate at this time   Intake/Output Summary (Last 24 hours) at 01/06/13 1209 Last data filed at 01/06/13 0500  Gross per 24 hour  Intake    350 ml  Output   1800 ml  Net  -1450 ml    Last BM:  01/02/13   Labs:   Recent Labs Lab 01/02/13 0445 01/04/13 1325 01/05/13 0431 01/06/13 0509  NA 143  --  146* 144  K 4.3  --  3.7 3.7  CL 111  --  110 108  CO2 24  --  28 27  BUN 30*  --  40* 32*  CREATININE 0.97  --  0.91 0.85  CALCIUM 8.1*  --  8.1* 8.3*  MG  --  1.7  --   --   GLUCOSE 175*  --  185* 195*    CBG (last 3)   Recent Labs  01/05/13 2104 01/06/13 0723 01/06/13 1121  GLUCAP 118* 173* 208*    Scheduled Meds: .  ipratropium  0.5 mg Nebulization Q4H   And  . albuterol  2.5 mg Nebulization Q4H  . ARIPiprazole  5 mg Oral Daily  . aspirin  81 mg Oral Daily  . azithromycin  500 mg Intravenous Q24H  . benztropine  0.5 mg Oral Daily  . ceFEPime (MAXIPIME) IV  2 g Intravenous Q12H  . colesevelam  1,875 mg Oral BID WC  . DULoxetine  40 mg Oral BID  . enoxaparin (LOVENOX) injection  40 mg Subcutaneous Daily  . furosemide  40 mg Intravenous Daily  . insulin aspart  0-9 Units Subcutaneous TID WC  . insulin glargine  12 Units Subcutaneous QHS  . iron polysaccharides  150 mg Oral Daily  . levothyroxine  200 mcg Oral QAC breakfast  . lisinopril  20 mg Oral Daily  . methylPREDNISolone sodium succinate  80 mg Intravenous Q8H  . metoprolol  25 mg Oral BID  . niacin  1,000 mg  Oral QHS  . omega-3 acid ethyl esters  2 g Oral Daily  . pantoprazole  80 mg Oral Daily  . simvastatin  20 mg Oral q1800  . traZODone  100 mg Oral QHS  . vancomycin  1,250 mg Intravenous Q24H    Continuous Infusions: . sodium chloride 75 mL/hr at 01/06/13 4503    Past Medical History  Diagnosis Date  . Diabetes mellitus   . COPD (chronic obstructive pulmonary disease)   . ASCVD (arteriosclerotic cardiovascular disease)     MI in 96 requiring BMS CX; DES to M1 in 2000;normal coronary angiography in 2004  . Hyperlipidemia   . Hypothyroidism   . Tobacco abuse   . Depression   . DVT (deep venous thrombosis)   . Nephrolithiasis 2006    stone extraction   . Amputation of hand, right     traumatic  . Allergic rhinitis   . Cholelithiasis   . GERD (gastroesophageal reflux disease)   . Low back pain   . Peripheral neuropathy   . Diabetes mellitus type I   . DDD (degenerative disc disease), lumbar   . Sciatic pain     right  . Tremor     Past Surgical History  Procedure Laterality Date  . Dilation and curettage of uterus  1974  . Partial hysterectomy  1978  . Total abdominal hysterectomy w/ bilateral salpingoophorectomy  2002  . Shoulder surgery      Left shoulder for RTC;left arm surgery '98/left hand surgery 2001  . Cholecystectomy    . Umbilical hernia repair  2008  . Colonoscopy  01/2009    nl repeat in 5  years  . Back surgery    . Hand amputation Right     traumatic    Colman Cater MS,RD,LDN,CSG Office: #[phone] Pager: [phone]

## 2013-01-06 NOTE — Progress Notes (Signed)
Patient ID: [name], female   DOB: 04-09-1951, 62 y.o.   MRN: [id number]  [place] A. Merlene Laughter, MD     [email or web address]          ZORIANNA TALIAFERRO is an 62 y.o. female.   Assessment/Plan: Toxic metabolic encephalopathy. Again, the etiology is multifactorial including hypoxia, pneumonia and medication effect particularly from high-dose steroids. We'll continue to follow the patient see how she progresses. She possible could benefit from increasing her neuroleptics although this may have some down side with worsening tremors. We will add thiamine to the regimen.  Essential tremor likely worsened with steroids.  The patient is more alert and with available she still confused. She seemed to have visual hallucinations. She continues to have continuous tremors involving the extremities and also the head. The characteristic again is high frequency low amplitude type tremors. She moves both sides well.    Objective: Vital signs in last 24 hours: Temp:  [97.9 F (36.6 C)-98.7 F (37.1 C)] 97.9 F (36.6 C) (03/25 1352) Pulse Rate:  [81-95] 81 (03/25 1352) Resp:  [20] 20 (03/25 1352) BP: (149-167)/(67-80) 149/68 mmHg (03/25 1352) SpO2:  [90 %-99 %] 94 % (03/25 1525)  Intake/Output from previous day: 03/24 0701 - 03/25 0700 In: 920 [P.O.:120; I.V.:450; IV Piggyback:350] Out: 3300 [Urine:3300] Intake/Output this shift: Total I/O In: 3015 [P.O.:840; I.V.:2175] Out: 2100 [Urine:2100] Nutritional status: Sodium Restricted   Lab Results: Results for orders placed during the hospital encounter of 12/26/12 (from the past 48 hour(s))  GLUCOSE, CAPILLARY     Status: Abnormal   Collection Time    01/04/13  9:04 PM      Result Value Range   Glucose-Capillary 238 (*) 70 - 99 mg/dL   Comment 1 Notify RN    BASIC METABOLIC PANEL     Status: Abnormal   Collection Time    01/05/13  4:31 AM      Result Value Range   Sodium 146 (*) 135 - 145 mEq/L   Potassium 3.7   3.5 - 5.1 mEq/L   Chloride 110  96 - 112 mEq/L   CO2 28  19 - 32 mEq/L   Glucose, Bld 185 (*) 70 - 99 mg/dL   BUN 40 (*) 6 - 23 mg/dL   Creatinine, Ser 0.91  0.50 - 1.10 mg/dL   Calcium 8.1 (*) 8.4 - 10.5 mg/dL   GFR calc non Af Amer 67 (*) >90 mL/min   GFR calc Af Amer 77 (*) >90 mL/min   Comment:            The eGFR has been calculated     using the CKD EPI equation.     This calculation has not been     validated in all clinical     situations.     eGFR's persistently     <90 mL/min signify     possible Chronic Kidney Disease.  GLUCOSE, CAPILLARY     Status: Abnormal   Collection Time    01/05/13  7:43 AM      Result Value Range   Glucose-Capillary 126 (*) 70 - 99 mg/dL   Comment 1 Documented in Chart     Comment 2 Notify RN    GLUCOSE, CAPILLARY     Status: Abnormal   Collection Time    01/05/13 12:08 PM      Result Value Range   Glucose-Capillary 145 (*) 70 - 99 mg/dL  GLUCOSE, CAPILLARY     Status:  None   Collection Time    01/05/13  4:54 PM      Result Value Range   Glucose-Capillary 97  70 - 99 mg/dL  GLUCOSE, CAPILLARY     Status: Abnormal   Collection Time    01/05/13  9:04 PM      Result Value Range   Glucose-Capillary 118 (*) 70 - 99 mg/dL   Comment 1 Notify RN    BASIC METABOLIC PANEL     Status: Abnormal   Collection Time    01/06/13  5:09 AM      Result Value Range   Sodium 144  135 - 145 mEq/L   Potassium 3.7  3.5 - 5.1 mEq/L   Chloride 108  96 - 112 mEq/L   CO2 27  19 - 32 mEq/L   Glucose, Bld 195 (*) 70 - 99 mg/dL   BUN 32 (*) 6 - 23 mg/dL   Creatinine, Ser 0.85  0.50 - 1.10 mg/dL   Calcium 8.3 (*) 8.4 - 10.5 mg/dL   GFR calc non Af Amer 72 (*) >90 mL/min   GFR calc Af Amer 84 (*) >90 mL/min   Comment:            The eGFR has been calculated     using the CKD EPI equation.     This calculation has not been     validated in all clinical     situations.     eGFR's persistently     <90 mL/min signify     possible Chronic Kidney Disease.    GLUCOSE, CAPILLARY     Status: Abnormal   Collection Time    01/06/13  7:23 AM      Result Value Range   Glucose-Capillary 173 (*) 70 - 99 mg/dL   Comment 1 Notify RN     Comment 2 Documented in Chart    MAGNESIUM     Status: None   Collection Time    01/06/13 11:00 AM      Result Value Range   Magnesium 1.6  1.5 - 2.5 mg/dL  VANCOMYCIN, TROUGH     Status: None   Collection Time    01/06/13 11:03 AM      Result Value Range   Vancomycin Tr 16.6  10.0 - 20.0 ug/mL  GLUCOSE, CAPILLARY     Status: Abnormal   Collection Time    01/06/13 11:21 AM      Result Value Range   Glucose-Capillary 208 (*) 70 - 99 mg/dL   Comment 1 Notify RN     Comment 2 Documented in Chart    GLUCOSE, CAPILLARY     Status: Abnormal   Collection Time    01/06/13  4:47 PM      Result Value Range   Glucose-Capillary 237 (*) 70 - 99 mg/dL   Comment 1 Documented in Chart     Comment 2 Notify RN      Lipid Panel No results found for this basename: CHOL, TRIG, HDL, CHOLHDL, VLDL, LDLCALC,  in the last 72 hours  Studies/Results: Dg Chest Port 1 View  01/05/2013  *RADIOLOGY REPORT*  Clinical Data: Pneumonia.  Altered mental status.  PORTABLE CHEST - 1 VIEW  Comparison: Chest 01/02/2013 and 12/31/2012.  Findings: Diffuse bilateral airspace disease persists and appears slightly worsened compared to the most recent study.  Heart size is upper normal.  No pneumothorax or pleural effusion.  IMPRESSION: Some worsening in diffuse bilateral airspace disease consistent with multifocal  pneumonia and/or pulmonary edema.   Original Report Authenticated By: Orlean Patten, M.D.     Medications:  Scheduled Meds: . ipratropium  0.5 mg Nebulization Q4H   And  . albuterol  2.5 mg Nebulization Q4H  . ARIPiprazole  5 mg Oral Daily  . aspirin  81 mg Oral Daily  . azithromycin  500 mg Intravenous Q24H  . benztropine  0.5 mg Oral Daily  . ceFEPime (MAXIPIME) IV  2 g Intravenous Q12H  . colesevelam  1,875 mg Oral BID WC   . DULoxetine  40 mg Oral BID  . enoxaparin (LOVENOX) injection  40 mg Subcutaneous Daily  . feeding supplement  237 mL Oral BID BM  . feeding supplement  30 mL Oral TID WC  . furosemide  40 mg Intravenous Daily  . insulin aspart  0-9 Units Subcutaneous TID WC  . insulin glargine  12 Units Subcutaneous QHS  . iron polysaccharides  150 mg Oral Daily  . levothyroxine  200 mcg Oral QAC breakfast  . lisinopril  20 mg Oral Daily  . methylPREDNISolone sodium succinate  80 mg Intravenous Q8H  . metoprolol  25 mg Oral BID  . niacin  1,000 mg Oral QHS  . omega-3 acid ethyl esters  2 g Oral Daily  . pantoprazole  80 mg Oral Daily  . simvastatin  20 mg Oral q1800  . traZODone  100 mg Oral QHS  . vancomycin  1,250 mg Intravenous Q24H   Continuous Infusions: . sodium chloride 75 mL/hr at 01/06/13 0942   PRN Meds:.albuterol, LORazepam, magnesium hydroxide, nitroGLYCERIN, sodium chloride, traMADol    LOS: 11 days   Monnie Gudgel A. Merlene Laughter, M.D.  Diplomate, Tax adviser of Psychiatry and Neurology ( Neurology).

## 2013-01-06 NOTE — Progress Notes (Signed)
ANTIBIOTIC CONSULT NOTE  Pharmacy Consult for Vancomycin Indication: rule out pneumonia  Allergies  Allergen Reactions  . Ciprofloxacin     REACTION: Rash  . Iohexol      Desc: CHEST TIGHTNESS,BRETHING PROBLEMS NEEDS PRE MEDS   . Neomycin-Bacitracin Zn-Polymyx     REACTION: Rash  . Penicillins     REACTION: Rash  . Povidone     REACTION: Rash   Patient Measurements: Height: 5' 6"  (167.6 cm) Weight: 172 lb 13.5 oz (78.4 kg) IBW/kg (Calculated) : 59.3  Vital Signs: Temp: 98.7 F (37.1 C) (03/25 0608) BP: 167/67 mmHg (03/25 0608) Pulse Rate: 91 (03/25 0608) Intake/Output from previous day: 03/24 0701 - 03/25 0700 In: 920 [P.O.:120; I.V.:450; IV Piggyback:350] Out: 3300 [Urine:3300] Intake/Output from this shift:    Labs:  Recent Labs  01/04/13 1325 01/05/13 0431 01/06/13 0509  WBC 10.0  --   --   HGB 9.2*  --   --   PLT 212  --   --   CREATININE  --  0.91 0.85   Estimated Creatinine Clearance: 73.4 ml/min (by C-G formula based on Cr of 0.85).  Recent Labs  01/06/13 1103  VANCOTROUGH 16.6    Microbiology: Recent Results (from the past 720 hour(s))  URINE CULTURE     Status: None   Collection Time    12/26/12  2:37 PM      Result Value Range Status   Specimen Description URINE, CATHETERIZED   Final   Special Requests NONE   Final   Culture  Setup Time 12/27/2012 05:01   Final   Colony Count NO GROWTH   Final   Culture NO GROWTH   Final   Report Status 12/28/2012 FINAL   Final  CULTURE, BLOOD (ROUTINE X 2)     Status: None   Collection Time    12/26/12  2:55 PM      Result Value Range Status   Specimen Description BLOOD LEFT ARM   Final   Special Requests BOTTLES DRAWN AEROBIC ONLY 6CC   Final   Culture NO GROWTH 5 DAYS   Final   Report Status 12/31/2012 FINAL   Final  CULTURE, BLOOD (ROUTINE X 2)     Status: None   Collection Time    12/26/12  3:10 PM      Result Value Range Status   Specimen Description BLOOD RIGHT ARM   Final   Special  Requests BOTTLES DRAWN AEROBIC ONLY 6CC   Final   Culture NO GROWTH 5 DAYS   Final   Report Status 12/31/2012 FINAL   Final  MRSA PCR SCREENING     Status: None   Collection Time    12/31/12  8:30 PM      Result Value Range Status   MRSA by PCR NEGATIVE  NEGATIVE Final   Comment:            The GeneXpert MRSA Assay (FDA     approved for NASAL specimens     only), is one component of a     comprehensive MRSA colonization     surveillance program. It is not     intended to diagnose MRSA     infection nor to guide or     monitor treatment for     MRSA infections.   Medical History: Past Medical History  Diagnosis Date  . Diabetes mellitus   . COPD (chronic obstructive pulmonary disease)   . ASCVD (arteriosclerotic cardiovascular disease)  MI in 96 requiring BMS CX; DES to M1 in 2000;normal coronary angiography in 2004  . Hyperlipidemia   . Hypothyroidism   . Tobacco abuse   . Depression   . DVT (deep venous thrombosis)   . Nephrolithiasis 2006    stone extraction   . Amputation of hand, right     traumatic  . Allergic rhinitis   . Cholelithiasis   . GERD (gastroesophageal reflux disease)   . Low back pain   . Peripheral neuropathy   . Diabetes mellitus type I   . DDD (degenerative disc disease), lumbar   . Sciatic pain     right  . Tremor    Medications:  Scheduled:  . ipratropium  0.5 mg Nebulization Q4H   And  . albuterol  2.5 mg Nebulization Q4H  . ARIPiprazole  5 mg Oral Daily  . aspirin  81 mg Oral Daily  . azithromycin  500 mg Intravenous Q24H  . benztropine  0.5 mg Oral Daily  . ceFEPime (MAXIPIME) IV  2 g Intravenous Q12H  . colesevelam  1,875 mg Oral BID WC  . DULoxetine  40 mg Oral BID  . enoxaparin (LOVENOX) injection  40 mg Subcutaneous Daily  . furosemide  40 mg Intravenous Daily  . insulin aspart  0-9 Units Subcutaneous TID WC  . insulin glargine  12 Units Subcutaneous QHS  . iron polysaccharides  150 mg Oral Daily  . levothyroxine  200 mcg  Oral QAC breakfast  . lisinopril  20 mg Oral Daily  . methylPREDNISolone sodium succinate  80 mg Intravenous Q8H  . metoprolol  25 mg Oral BID  . niacin  1,000 mg Oral QHS  . omega-3 acid ethyl esters  2 g Oral Daily  . pantoprazole  80 mg Oral Daily  . simvastatin  20 mg Oral q1800  . traZODone  100 mg Oral QHS  . vancomycin  1,250 mg Intravenous Q24H  . [DISCONTINUED] methylPREDNISolone sodium succinate  125 mg Intravenous Q8H   Assessment: 62 yo F admitted with COPD exacerbation and CAP.  She is on day#11 Vancomycin, Zithromax, and day#7 Cefepime.   Renal function has been stable.  She is afebrile with normal WBC.  Cx data has been normal to date. Vancomycin trough was slightly less than goal on 3/18 and dose was increased.  Vancomycin trough is now on target.  Plan per MD is to continue ABX for 14 more days due to changes on CXR.  Goal of Therapy:  Vancomycin trough level 15-20 mcg/ml  Plan:  Vancomycin 121m IV every 24 hours. Check weekly Vancomycin trough level  Cefepime 2gm IV Q12h  Zithromax 5025mIV Q24h Monitor renal function and cx data  Duration of therapy per MD  HaHart Robinsons 01/06/2013,12:18 PM

## 2013-01-06 NOTE — Progress Notes (Signed)
[name], [name] NO.:  [id number]  MEDICAL RECORD NO.:  [id number]  LOCATION:                                 FACILITY:  PHYSICIAN:  Unk Lightning, MDDATE OF BIRTH:  [birth year]  DATE OF PROCEDURE:  01/05/2013 DATE OF DISCHARGE:                                PROGRESS NOTE   SUBJECTIVE:  The patient has multilobar pneumonia.  Currently, on triple antibiotics in the form of Maxipime and Zithromax as well as vancomycin since admission.  She was also was on Solu-Medrol for bronchospastic component.  She has iron deficiency with anemia.  Hemoglobin 9.2 and stable awaiting stools for occult blood for 5 days now.  Apparently, she has not had bowel movement.  She has altered mental status, presumably dementia and/or delirium.  She has insulin-dependent diabetes, hypothyroidism, COPD.  Chest x-ray done today reveals no significant improvement and fluffy infiltrates, however, clinically she is improved.  OBJECTIVE:  VITAL SIGNS:  Blood pressure is 171/66, temperature 98.1, respiratory rate 22, O2 saturation 94%. NECK:  Shows no JVD. LUNGS:  Show scattered rhonchi.  Diminished breath sounds at the bases. No rales audible.  Mild end-expiratory wheeze is noted.  No inspiratory wheeze noted, somewhat improved over 48 hours ago. HEART:  Regular rhythm.  No S3 or S4 auscultated.  The patient had dementia workup.  TSH is near normal, on supplementation.  B12 is 786.  Foley also likewise normal RPR is negative.  ASSESSMENT AND PLAN:  The plan at present is to give milk of magnesia to obtain stool samples to check any heme positivity.  Then, we will supplement with Niferex 150 p.o. b.i.d.  Ferritin less than 10. Awaiting neurology consult for altered mental status/dementia/delirium and depression with superimposed anxiety.  Blood pressure elevated, but this is an isolated number compared to the previous 5 days.  We will observe blood pressure and  hemodynamics.  Continue DuoNeb nebulizer, diminish Solu-Medrol from 125 down to 80 q.8 hours.     Unk Lightning, MD     RMD/MEDQ  D:  01/05/2013  T:  01/05/2013  Job:  [id number]

## 2013-01-06 NOTE — Clinical Social Work Psychosocial (Signed)
    Clinical Social Work Department BRIEF PSYCHOSOCIAL ASSESSMENT 01/06/2013  Patient:  FLORNCE, RECORD     Account Number:  [id number]     [place] date:  12/26/2012  Clinical Social Worker:  Edwyna Shell, [place]  Date/Time:  01/06/2013 11:00 AM  Referred by:  Physician  Date Referred:  01/06/2013 Referred for  SNF Placement   Other Referral:   Interview type:  Family Other interview type:    PSYCHOSOCIAL DATA Living Status:  FAMILY Admitted from facility:   Level of care:   Primary support name:  Shayne Alken Primary support relationship to patient:  SPOUSE Degree of support available:   Husband in home, husband also says he has roommate and sister who can assist w patient's care    CURRENT CONCERNS Current Concerns  Post-Acute Placement   Other Concerns:    SOCIAL WORK ASSESSMENT / PLAN CSW met w patient and husband in patient room.  Patient oriented to self only, tremulous, and suffers from dementia.  CSW spoke w husband, explained PT and MD recommendation for short term SNF placement for PT and IV antibiotic therapy.  Explained SNF placement process, including anticipated copays for [facility] of approx $25/day.  Husband adamant that they cannot afford to pay copays, referenced utility bills that he has to pay at present (approx $400).  States wife receives approx $1100/month income.  States that wife does not have Medicaid, but does have some kind of insurance which picks up costs of medications after approx March when deductible has been met.    Patient has been married to husband approx 7 years. Husband says wife has become much more disabled recently - used to be able to get around house somewhat independently. Husband says she gets out of bed at night wanders in home. Husband also says she is much more agitated now than before.  However, he also says that he cannot afford to pay for patient copays for SNF placement and says that he has help from a man  who lives in their home as well as patient's sister in law.  At present, copays and cost of placement appear to be a barrier to SNF placement and husband is declining placement due to copays at this time. CSW will continue to monitor and work w RN CM and MD to determine actual need for placement.    CSW spoke w [employer] DSS, [place] ([place]) states that husband has applied for Medicaid; however, she has determined that patient is not eligible for Medicaid of any kind at this time.   Assessment/plan status:  Psychosocial Support/Ongoing Assessment of Needs Other assessment/ plan:   Information/referral to community resources:   SNF list    PATIENT'S/FAMILY'S RESPONSE TO PLAN OF CARE: Husband asked for information on whether wife qualifies for Medicaid that might pay co pays.    Edwyna Shell, LCSW Clinical Social Worker [phone])

## 2013-01-07 ENCOUNTER — Encounter (HOSPITAL_COMMUNITY): Payer: Self-pay

## 2013-01-07 LAB — BASIC METABOLIC PANEL
BUN: 29 mg/dL — ABNORMAL HIGH (ref 6–23)
BUN: 29 mg/dL — ABNORMAL HIGH (ref 6–23)
Calcium: 8.2 mg/dL — ABNORMAL LOW (ref 8.4–10.5)
Creatinine, Ser: 0.75 mg/dL (ref 0.50–1.10)
GFR calc Af Amer: >90 mL/min (ref >90)
GFR calc non Af Amer: 90 mL/min — ABNORMAL LOW (ref >90)
GFR calc non Af Amer: 88 mL/min — ABNORMAL LOW (ref >90)
Glucose, Bld: 182 mg/dL — ABNORMAL HIGH (ref 70–99)
Potassium: 3.4 mEq/L — ABNORMAL LOW (ref 3.5–5.1)
Sodium: 141 mEq/L (ref 135–145)

## 2013-01-07 LAB — GLUCOSE, CAPILLARY: Glucose-Capillary: 175 mg/dL — ABNORMAL HIGH (ref 70–99)

## 2013-01-07 MED ORDER — POTASSIUM CHLORIDE CRYS ER 20 MEQ PO TBCR
20.0000 meq | EXTENDED_RELEASE_TABLET | Freq: Every day | ORAL | Status: AC
  Administered 2013-01-07 – 2013-01-08 (×2): 20 meq via ORAL
  Filled 2013-01-07 (×2): qty 1

## 2013-01-07 MED ORDER — METHYLPREDNISOLONE SODIUM SUCC 125 MG IJ SOLR
60.0000 mg | Freq: Three times a day (TID) | INTRAMUSCULAR | Status: DC
  Administered 2013-01-07 (×2): 60 mg via INTRAVENOUS
  Filled 2013-01-07 (×2): qty 2

## 2013-01-07 MED ORDER — ARIPIPRAZOLE 10 MG PO TABS
10.0000 mg | ORAL_TABLET | Freq: Every day | ORAL | Status: DC
  Administered 2013-01-08: 10 mg via ORAL
  Filled 2013-01-07: qty 1

## 2013-01-07 MED ORDER — METFORMIN HCL 500 MG PO TABS
500.0000 mg | ORAL_TABLET | Freq: Two times a day (BID) | ORAL | Status: DC
  Administered 2013-01-07 – 2013-01-08 (×3): 500 mg via ORAL
  Filled 2013-01-07 (×3): qty 1

## 2013-01-07 NOTE — Progress Notes (Signed)
[id number] 

## 2013-01-07 NOTE — Progress Notes (Signed)
Patient ID: [name], female   DOB: 04/07/1951, 62 y.o.   MRN: [id number]  Weston A. [name] Laughter, MD     [email or web address]          LYNNETTE [name] is an 62 y.o. female.   Assessment/Plan:   As stated toxic metabolic encephalopathy. Given the agitation, the patient's Abilify will be increased. The patient is drowsy and she is just gotten medications because of being restless. She does open her eyes and says high to verbal command. She states that she's doing okay.  Objective: Vital signs in last 24 hours: Temp:  [97.7 F (36.5 C)-98.3 F (36.8 C)] 97.7 F (36.5 C) (03/26 0353) Pulse Rate:  [83-89] 83 (03/26 0353) Resp:  [20] 20 (03/26 0353) BP: (161-174)/(66-71) 161/66 mmHg (03/26 0353) SpO2:  [90 %-93 %] 93 % (03/26 1506)  Intake/Output from previous day: 03/25 0701 - 03/26 0700 In: 3215 [P.O.:1040; I.V.:2175] Out: 2300 [Urine:2300] Intake/Output this shift: Total I/O In: -  Out: 1600 [Urine:1600] Nutritional status: Sodium Restricted   Lab Results: Results for orders placed during the hospital encounter of 12/26/12 (from the past 48 hour(s))  GLUCOSE, CAPILLARY     Status: Abnormal   Collection Time    01/05/13  9:04 PM      Result Value Range   Glucose-Capillary 118 (*) 70 - 99 mg/dL   Comment 1 Notify RN    BASIC METABOLIC PANEL     Status: Abnormal   Collection Time    01/06/13  5:09 AM      Result Value Range   Sodium 144  135 - 145 mEq/L   Potassium 3.7  3.5 - 5.1 mEq/L   Chloride 108  96 - 112 mEq/L   CO2 27  19 - 32 mEq/L   Glucose, Bld 195 (*) 70 - 99 mg/dL   BUN 32 (*) 6 - 23 mg/dL   Creatinine, Ser 0.85  0.50 - 1.10 mg/dL   Calcium 8.3 (*) 8.4 - 10.5 mg/dL   GFR calc non Af Amer 72 (*) >90 mL/min   GFR calc Af Amer 84 (*) >90 mL/min   Comment:            The eGFR has been calculated     using the CKD EPI equation.     This calculation has not been     validated in all clinical     situations.     eGFR's  persistently     <90 mL/min signify     possible Chronic Kidney Disease.  GLUCOSE, CAPILLARY     Status: Abnormal   Collection Time    01/06/13  7:23 AM      Result Value Range   Glucose-Capillary 173 (*) 70 - 99 mg/dL   Comment 1 Notify RN     Comment 2 Documented in Chart    MAGNESIUM     Status: None   Collection Time    01/06/13 11:00 AM      Result Value Range   Magnesium 1.6  1.5 - 2.5 mg/dL  VANCOMYCIN, TROUGH     Status: None   Collection Time    01/06/13 11:03 AM      Result Value Range   Vancomycin Tr 16.6  10.0 - 20.0 ug/mL  GLUCOSE, CAPILLARY     Status: Abnormal   Collection Time    01/06/13 11:21 AM      Result Value Range   Glucose-Capillary 208 (*) 70 - 99 mg/dL  Comment 1 Notify RN     Comment 2 Documented in Chart    GLUCOSE, CAPILLARY     Status: Abnormal   Collection Time    01/06/13  4:47 PM      Result Value Range   Glucose-Capillary 237 (*) 70 - 99 mg/dL   Comment 1 Documented in Chart     Comment 2 Notify RN    GLUCOSE, CAPILLARY     Status: Abnormal   Collection Time    01/06/13  7:54 PM      Result Value Range   Glucose-Capillary 276 (*) 70 - 99 mg/dL  BASIC METABOLIC PANEL     Status: Abnormal   Collection Time    01/07/13  4:44 AM      Result Value Range   Sodium 141  135 - 145 mEq/L   Potassium 3.4 (*) 3.5 - 5.1 mEq/L   Chloride 105  96 - 112 mEq/L   CO2 27  19 - 32 mEq/L   Glucose, Bld 243 (*) 70 - 99 mg/dL   BUN 29 (*) 6 - 23 mg/dL   Creatinine, Ser 0.75  0.50 - 1.10 mg/dL   Calcium 8.1 (*) 8.4 - 10.5 mg/dL   GFR calc non Af Amer 90 (*) >90 mL/min   GFR calc Af Amer >90  >90 mL/min   Comment:            The eGFR has been calculated     using the CKD EPI equation.     This calculation has not been     validated in all clinical     situations.     eGFR's persistently     <90 mL/min signify     possible Chronic Kidney Disease.  TSH     Status: None   Collection Time    01/07/13  4:44 AM      Result Value Range   TSH 0.669   0.350 - 4.500 uIU/mL  GLUCOSE, CAPILLARY     Status: Abnormal   Collection Time    01/07/13  7:54 AM      Result Value Range   Glucose-Capillary 210 (*) 70 - 99 mg/dL   Comment 1 Notify RN    GLUCOSE, CAPILLARY     Status: Abnormal   Collection Time    01/07/13 12:04 PM      Result Value Range   Glucose-Capillary 229 (*) 70 - 99 mg/dL   Comment 1 Notify RN    BASIC METABOLIC PANEL     Status: Abnormal   Collection Time    01/07/13  2:42 PM      Result Value Range   Sodium 141  135 - 145 mEq/L   Potassium 3.6  3.5 - 5.1 mEq/L   Chloride 104  96 - 112 mEq/L   CO2 32  19 - 32 mEq/L   Glucose, Bld 182 (*) 70 - 99 mg/dL   BUN 29 (*) 6 - 23 mg/dL   Creatinine, Ser 0.78  0.50 - 1.10 mg/dL   Calcium 8.2 (*) 8.4 - 10.5 mg/dL   GFR calc non Af Amer 88 (*) >90 mL/min   GFR calc Af Amer >90  >90 mL/min   Comment:            The eGFR has been calculated     using the CKD EPI equation.     This calculation has not been     validated in all clinical  situations.     eGFR's persistently     <90 mL/min signify     possible Chronic Kidney Disease.  GLUCOSE, CAPILLARY     Status: Abnormal   Collection Time    01/07/13  4:43 PM      Result Value Range   Glucose-Capillary 158 (*) 70 - 99 mg/dL    Lipid Panel No results found for this basename: CHOL, TRIG, HDL, CHOLHDL, VLDL, LDLCALC,  in the last 72 hours  Studies/Results: No results found.  Medications:  Scheduled Meds: . ipratropium  0.5 mg Nebulization Q4H   And  . albuterol  2.5 mg Nebulization Q4H  . ARIPiprazole  5 mg Oral Daily  . aspirin  81 mg Oral Daily  . azithromycin  500 mg Intravenous Q24H  . benztropine  0.5 mg Oral Daily  . ceFEPime (MAXIPIME) IV  2 g Intravenous Q12H  . colesevelam  1,875 mg Oral BID WC  . DULoxetine  40 mg Oral BID  . enoxaparin (LOVENOX) injection  40 mg Subcutaneous Daily  . feeding supplement  237 mL Oral BID BM  . feeding supplement  30 mL Oral TID WC  . furosemide  40 mg  Intravenous Daily  . insulin aspart  0-9 Units Subcutaneous TID WC  . insulin glargine  12 Units Subcutaneous QHS  . iron polysaccharides  150 mg Oral Daily  . levothyroxine  200 mcg Oral QAC breakfast  . lisinopril  20 mg Oral Daily  . metFORMIN  500 mg Oral BID WC  . methylPREDNISolone sodium succinate  60 mg Intravenous Q8H  . metoprolol  25 mg Oral BID  . niacin  1,000 mg Oral QHS  . omega-3 acid ethyl esters  2 g Oral Daily  . pantoprazole  80 mg Oral Daily  . potassium chloride  20 mEq Oral Daily  . simvastatin  20 mg Oral q1800  . traZODone  100 mg Oral QHS  . vancomycin  1,250 mg Intravenous Q24H   Continuous Infusions: . sodium chloride 75 mL/hr at 01/06/13 0942   PRN Meds:.albuterol, LORazepam, magnesium hydroxide, nitroGLYCERIN, sodium chloride, traMADol    LOS: 12 days   Hiroko Tregre A. [name], M.D.  Diplomate, Tax adviser of Psychiatry and Neurology ( Neurology).

## 2013-01-07 NOTE — Clinical Social Work Note (Signed)
Patient's husband accepted bed offer at [facility] on behalf of patient.  Debbie at [facility] notified and agreeable to patient transfer to [facility] at discharge.  Edwyna Shell, LCSW Clinical Social Worker [phone])

## 2013-01-07 NOTE — Clinical Social Work Note (Signed)
Husband given bed offers [facility], [facility], [facility] [place]).  Husband wants her in [place], advised husband to call admissions at both [facility] and [facility] to discuss copays and other issues related to potential admission.  Husband says he will call both facilities and make decision today.    Edwyna Shell, LCSW Clinical Social Worker [phone])

## 2013-01-07 NOTE — Progress Notes (Signed)
Pt's metformin was previously held per hospital policy for UXB>9.8.  Her Scr has improved and CBGs currently >200. Metformin was resumed at previously ordered dose. Netta Cedars, PharmD, BCPS

## 2013-01-07 NOTE — Progress Notes (Signed)
NAMEMERCER, STALLWORTH NO.:  [id number]  MEDICAL RECORD NO.:  [id number]  LOCATION:  A301                          FACILITY:  [facility]  PHYSICIAN:  Unk Lightning, MDDATE OF BIRTH:  Mar 30, 1951  DATE OF PROCEDURE: DATE OF DISCHARGE:                                PROGRESS NOTE   HISTORY OF PRESENT ILLNESS:  The patient has multilobar pneumonia. Chest x-ray reveals equal or possibly worsening infiltrates.  She has altered mental status, toxic metabolic encephalopathy, possible delirium, depression, anxiety, has iron deficiency with concomitant anemia of 9.2, still awaiting stools.  Blood pressure 149/68, temperature 97.9, pulse 81 and regular, respiratory rate is 20.  The patient currently controlled on vanc, Maxipime, and Zithromax since admission along with DuoNeb nebulizer and Solu-Medrol 80 mg IV q.8 hours reduced from 125.  PHYSICAL EXAMINATION:  LUNGS:  Show prolonged expiratory phase, scattered rhonchi, mild expiratory wheeze.  No rales audible. HEART:  Regular rhythm, no S3, S4 audible.  ABDOMEN:  Essentially benign. EXTREMITIES:  Showed 1+ pedal edema.  PLAN:  Right now is continue triple antibiotic, Solu-Medrol, DuoNeb nebulizer.  We will obtain TSH, magnesium level, and BMET today or tomorrow morning and make further recommendations as the database expands.     Unk Lightning, MD     RMD/MEDQ  D:  01/06/2013  T:  01/07/2013  Job:  [id number]

## 2013-01-07 NOTE — Progress Notes (Signed)
She is overall about the same. Her mental status is essentially unchanged. Chest x-ray done about 2 days ago shows still significant infiltrates.  She's awake not very cooperative afebrile now her chest shows rhonchi bilaterally her heart is regular and her abdomen is soft  She's going to need prolonged IV antibiotics. It will probably take the chest x-ray some time to clear but she still seems to have some respiratory problems so I think she is going to need prolonged antibiotics. I have requested PICC line.  She'll have PICC line continue his other treatments and follow

## 2013-01-08 ENCOUNTER — Inpatient Hospital Stay (HOSPITAL_COMMUNITY): Payer: Medicare Other

## 2013-01-08 ENCOUNTER — Encounter (HOSPITAL_COMMUNITY): Payer: Self-pay

## 2013-01-08 LAB — GLUCOSE, CAPILLARY: Glucose-Capillary: 100 mg/dL — ABNORMAL HIGH (ref 70–99)

## 2013-01-08 MED ORDER — DEXTROSE 5 % IV SOLN: 2.0000 g | Freq: Two times a day (BID) | INTRAVENOUS | Status: DC

## 2013-01-08 MED ORDER — IPRATROPIUM BROMIDE 0.02 % IN SOLN: 0.5000 mg | RESPIRATORY_TRACT | Status: DC

## 2013-01-08 MED ORDER — ALBUTEROL SULFATE (5 MG/ML) 0.5% IN NEBU: 2.5000 mg | INHALATION_SOLUTION | RESPIRATORY_TRACT | Status: DC | PRN

## 2013-01-08 MED ORDER — PREDNISONE 20 MG PO TABS: 20.0000 mg | ORAL_TABLET | Freq: Every day | ORAL | Status: DC

## 2013-01-08 MED ORDER — POLYSACCHARIDE IRON COMPLEX 150 MG PO CAPS: 150.0000 mg | ORAL_CAPSULE | Freq: Every day | ORAL | Status: DC

## 2013-01-08 MED ORDER — VANCOMYCIN HCL 10 G IV SOLR: 1250.0000 mg | INTRAVENOUS | Status: DC

## 2013-01-08 MED ORDER — ALBUTEROL SULFATE (5 MG/ML) 0.5% IN NEBU: 2.5000 mg | INHALATION_SOLUTION | RESPIRATORY_TRACT | Status: DC

## 2013-01-08 MED ORDER — INSULIN ASPART 100 UNIT/ML ~~LOC~~ SOLN: 0.0000 [IU] | Freq: Three times a day (TID) | SUBCUTANEOUS | Status: DC

## 2013-01-08 MED ORDER — SODIUM CHLORIDE 0.9 % IJ SOLN: 10.0000 mL | Freq: Two times a day (BID) | INTRAMUSCULAR | Status: DC

## 2013-01-08 MED ORDER — INSULIN GLARGINE 100 UNIT/ML ~~LOC~~ SOLN: 12.0000 [IU] | Freq: Every day | SUBCUTANEOUS | Status: DC

## 2013-01-08 MED ORDER — SODIUM CHLORIDE 0.9 % IJ SOLN: 10.0000 mL | INTRAMUSCULAR | Status: DC | PRN

## 2013-01-08 NOTE — Discharge Summary (Signed)
[id number] 

## 2013-01-08 NOTE — Clinical Social Work Note (Signed)
Pt d/c today to [facility]. Pt's husband and facility aware and agreeable. Pt to transport via [facility]. CSW will fax d/c summary upon completion.   Benay Pike, [place]

## 2013-01-08 NOTE — Progress Notes (Signed)
No IV access. Per sitter in room pt rolled over on IV and it became occluded. Pt will have PICC inserted today. No peripheral IV attempted, verified by [facility].

## 2013-01-08 NOTE — Progress Notes (Signed)
Patient report called to [facility]. Patient transported via EMS to [facility]. Patient discharged in stable condition.

## 2013-01-08 NOTE — Progress Notes (Signed)
I think she is overall about the same. She remained somewhat confused. She lost IV access today so PICC line is being inserted.  She is awake poorly responsive but apparently was up all night last night and agitated than. She still has rales and rhonchi on her chest exam more on the right than on the left.  She has multilobar pneumonia following influenza. She's been treated as if this is staph which I think is appropriate. She is slowly responding. She has had severe encephalopathy with baseline mental illness and that is about the same she lost IV access and a PICC line is to be placed  Continue current treatments

## 2013-01-08 NOTE — Progress Notes (Signed)
NAMEMOLLI, GETHERS NO.:  [id number]  MEDICAL RECORD NO.:  [id number]  LOCATION:  A301                          FACILITY:  [facility]  PHYSICIAN:  Unk Lightning, MDDATE OF BIRTH:  Apr 07, 1951  DATE OF PROCEDURE: DATE OF DISCHARGE:                                PROGRESS NOTE   The patient has multilobar pneumonia, which is persisting for day 9 despite triple antibiotic therapy, currently controlled on Zithromax, cefepime as well as vancomycin.  She had altered mental status, possible delirium, home, possibly toxic metabolic encephalopathy, multifactorial including hypoxia, drug effect, steroids.  She has chronic benign essential tremor, possibly worsened by steroids according to Neurology. Significant COPD, hypokalemia with a potassium of 3.4 today.  After discussion with pulmonology who agreed that she will require 14 days of antibiotics for optimization and improvement of lung status.  We were trying to arrange a skilled nursing facility.  The patient does not participate in physical therapy at present, answers simple verbal questions.  Does not engage spontaneously in conversation.  Blood pressure is 161/66, temperature 97.7, pulse 83 and regular, respiratory rate 20, creatinine 0.75, potassium 3.4.  WBC 10, hemoglobin 9.2, she is iron deficient and started on Niferex 150 p.o. b.i.d.  PLAN:  Right now is to continue DuoNeb nebulizer.  Continue Solu-Medrol 80 IV q.8 h., 3 different antibiotics.  The patient will be given thiamine by Neurology.  Oral potassium 20 p.o. b.i.d. for 2 days by myself.     Unk Lightning, MD     RMD/MEDQ  D:  01/07/2013  T:  01/08/2013  Job:  [id number]

## 2013-01-08 NOTE — Progress Notes (Signed)
Physical Therapy Treatment Patient Details Name: [name] MRN: [id number] DOB: August 05, 1951 Today's Date: 01/08/2013 Time: 7543-6067 PT Time Calculation (min): 36 min  PT Assessment / Plan / Recommendation Comments on Treatment Session  Pt has improved quite a bit with mentation, mobility and balance since initial visit.  She was conversant, still very confused, cooperative.  To be transferred to SNF today.    Follow Up Recommendations        Does the patient have the potential to tolerate intense rehabilitation     Barriers to Discharge        Equipment Recommendations       Recommendations for Other Services    Frequency     Plan  (pt discharged)    Precautions / Restrictions     Pertinent Vitals/Pain     Mobility  Bed Mobility Supine to Sit: 4: Min assist;HOB elevated Sit to Supine: 4: Min assist;HOB elevated Transfers Sit to Stand: 5: Supervision;From bed;With upper extremity assist Stand to Sit: 5: Supervision;To bed;With upper extremity assist Ambulation/Gait Ambulation/Gait Assistance: 4: Min guard Ambulation Distance (Feet): 40 Feet Assistive device: Straight cane Gait Pattern: Narrow base of support General Gait Details: no gait ataxia noted today.Marland KitchenMarland Kitchenpt will walk into things if not watched Stairs: No Wheelchair Mobility Wheelchair Mobility: No    Exercises General Exercises - Lower Extremity Ankle Circles/Pumps: Strengthening;Both;10 reps;Supine Heel Slides: Strengthening;Both;10 reps;Supine Hip ABduction/ADduction: AROM;Both;10 reps;Supine Straight Leg Raises: AROM;Both;10 reps;Supine   PT Diagnosis:    PT Problem List:   PT Treatment Interventions:     PT Goals Acute Rehab PT Goals PT Goal: Supine/Side to Sit - Progress: Met PT Goal: Sit to Supine/Side - Progress: Met PT Goal: Sit to Stand - Progress: Met PT Goal: Stand to Sit - Progress: Met PT Goal: Ambulate - Progress: Met  Visit Information  Last PT Received On: 01/08/13     Subjective Data  Subjective: pt more conversant...says that she remembers me   Cognition  Cognition Overall Cognitive Status: History of cognitive impairments - at baseline Arousal/Alertness: Awake/alert    Balance     End of Session PT - End of Session Equipment Utilized During Treatment: Gait belt;Oxygen Activity Tolerance: Patient tolerated treatment well Patient left: in bed;with call bell/phone within reach;with bed alarm set Nurse Communication: Mobility status   GP     Sable Feil 01/08/2013, 1:56 PM

## 2013-01-08 NOTE — Clinical Social Work Placement (Signed)
Clinical Social Work Department CLINICAL SOCIAL WORK PLACEMENT NOTE 01/08/2013  Patient:  [name], [name]  Account Number:  [id number] Admit date:  12/26/2012  Clinical Social Worker:  Edwyna Shell, CLINICAL SOCIAL WORKER  Date/time:  01/06/2013 11:00 AM  Clinical Social Work is seeking post-discharge placement for this patient at the following level of care:   [place]   (*CSW will update this form in Epic as items are completed)   01/06/2013  Patient/family provided with [place] Department of Clinical Social Work's list of facilities offering this level of care within the geographic area requested by the patient (or if unable, by the patient's family).  01/06/2013  Patient/family informed of their freedom to choose among providers that offer the needed level of care, that participate in Medicare, Medicaid or managed care program needed by the patient, have an available bed and are willing to accept the patient.  01/06/2013  Patient/family informed of [facility]' ownership interest in [facility], as well as of the fact that they are under no obligation to receive care at this facility.  PASARR submitted to [facility] on 01/06/2013 PASARR number received from [employer] on   FL2 transmitted to all facilities in geographic area requested by pt/family on  01/06/2013 FL2 transmitted to all facilities within larger geographic area on   Patient informed that his/her managed care company has contracts with or will negotiate with  certain facilities, including the following:     Patient/family informed of bed offers received:  01/07/2013 Patient chooses bed at [place] Physician recommends and patient chooses bed at    Patient to be transferred to [place] on  01/08/2013 Patient to be transferred to facility by [facility]  The following physician request were entered in Epic:   Additional Comments: Patients husband states that they cannot  afford copays required for SNF placement.  Benay Pike, [place]

## 2013-01-09 NOTE — Discharge Summary (Signed)
NAMECLARITY, CISZEK NO.:  [id number]  MEDICAL RECORD NO.:  [id number]  LOCATION:                                 FACILITY:  PHYSICIAN:  Unk Lightning, MDDATE OF BIRTH:  09-Aug-1951  DATE OF ADMISSION:  12/26/2012 DATE OF DISCHARGE:  [facility]                              DISCHARGE SUMMARY   The patient is a 62 year old white female with severe COPD and continues smoking of tobacco, who came in with multilobar pneumonia, had some altered mental status, possible delirium, possible major depression, possible early dementia.  She likewise had insulin-dependent diabetes, hyperlipidemia, GERD, hypertension, major depression, anxiety.  Her hospital course was prolonged.  She had multilobar infiltrates, was placed on triple antibiotics in the form of Zithromax, Maxipime, and vancomycin, seen in consultation by Pulmonology.  She was given IV Solu- Medrol 125 q.6h and DuoNeb nebulizers and hence was slow to progress and resolve her bronchospastic component.  She likewise had altered mental status multiple times, seen in consultation by Neurology who thought that the mental status changes were multifactorial including delirium medicines and possible exacerbation of tremor by steroids.  I failed to mention she is also hypothyroid and has a benign essential tremor, which is quite pronounced.  She continued to progress and her steroids were dial down.  We ultimately placed on oral prednisone on the day of discharge.  Pulmonology felt she was going to require 14 days further of additional IV antibiotic therapy.  She had been on IV Zithromax for a period of 5 weeks and this was discontinued due to long-acting nature. She was continued, however, on trazodone 100 mg p.o. at bedtime, tramadol 50 mg p.o. t.i.d. p.r.n., pravastatin 40 mg p.o. daily, sublingual nitroglycerin 0.4 p.r.n. chest pain, Niaspan 100 mg nightly, Lopressor 50 mg p.o. b.i.d., metformin 500 mg p.o.  b.i.d., lisinopril 20 mg p.o. daily, Synthroid 200 mcg p.o. daily, Nexium 40 p.o. daily, fish oil 1000 mg p.o. b.i.d., Welchol 625 mg 3 tablets p.o. b.i.d., Cogentin 0.5 mg p.o. daily, aspirin 81 mg per day, and Abilify 5 mg p.o. daily. It is recommended that she have early ambulation in rehab center to check her TSH within 2 weeks, and monitor her renal function, and likewise to check her chest x-ray in a week after discharge to see if resolution.  She is only on 2 out of the 3 antibiotics as I have mentioned the vancomycin and Maxipime are given per pharmacy protocol, and Zithromax was discontinued after 7 days of having this struck in her system and had long duration of action.  She will follow up with the primary doctor when discharged from rehab.     Unk Lightning, MD     RMD/MEDQ  D:  01/08/2013  T:  01/08/2013  Job:  [id number]

## 2013-01-12 ENCOUNTER — Encounter (HOSPITAL_COMMUNITY): Payer: Self-pay

## 2013-01-13 ENCOUNTER — Ambulatory Visit (HOSPITAL_COMMUNITY)
Admission: RE | Admit: 2013-01-13 | Discharge: 2013-01-13 | Disposition: A | Discharge status: Home / Self Care | Payer: Medicare Other | Source: Ambulatory Visit | Attending: Internal Medicine | Admitting: Internal Medicine

## 2013-01-13 DIAGNOSIS — Z01812 Encounter for preprocedural laboratory examination: Secondary | ICD-10-CM | POA: Insufficient documentation

## 2013-01-13 DIAGNOSIS — E119 Type 2 diabetes mellitus without complications: Secondary | ICD-10-CM | POA: Insufficient documentation

## 2013-01-13 DIAGNOSIS — J189 Pneumonia, unspecified organism: Secondary | ICD-10-CM | POA: Insufficient documentation

## 2013-01-13 LAB — GLUCOSE, CAPILLARY: Glucose-Capillary: 116 mg/dL — ABNORMAL HIGH (ref 70–99)

## 2013-01-15 ENCOUNTER — Ambulatory Visit: Payer: Self-pay | Admitting: Neurology

## 2013-01-23 NOTE — Progress Notes (Signed)
Late Entry: Single lumen PICC inserted for treatment of Pneumonia. Original PICC dislodged by patient 3 days prior.

## 2013-01-30 ENCOUNTER — Other Ambulatory Visit (HOSPITAL_COMMUNITY): Payer: Self-pay | Admitting: Internal Medicine

## 2013-01-30 DIAGNOSIS — Z139 Encounter for screening, unspecified: Secondary | ICD-10-CM

## 2013-02-03 ENCOUNTER — Ambulatory Visit (HOSPITAL_COMMUNITY)
Admission: RE | Admit: 2013-02-03 | Discharge: 2013-02-03 | Disposition: A | Discharge status: Home / Self Care | Payer: Medicare Other | Source: Ambulatory Visit | Attending: Internal Medicine | Admitting: Internal Medicine

## 2013-02-03 DIAGNOSIS — Z1231 Encounter for screening mammogram for malignant neoplasm of breast: Secondary | ICD-10-CM | POA: Insufficient documentation

## 2013-02-03 DIAGNOSIS — Z139 Encounter for screening, unspecified: Secondary | ICD-10-CM

## 2013-03-21 IMAGING — CT CT CERVICAL SPINE W/O CM
3 of 4 series · 13 of 33 positions shown, 16 images · non-contrast
Comparison: X-ray cervical spine same day

CLINICAL DATA: Fall

CT CERVICAL SPINE WITHOUT CONTRAST
TECHNIQUE: Multidetector CT imaging of the cervical spine was
performed. Multiplanar CT image reconstructions were also
generated.

[Series 3: cervical 2.0 st axial · axial · 0.32mm/px · z∈[+67,+199]mm · 5 of 100 slices shown, 7 images]
[im 17/100  soft-tissue]
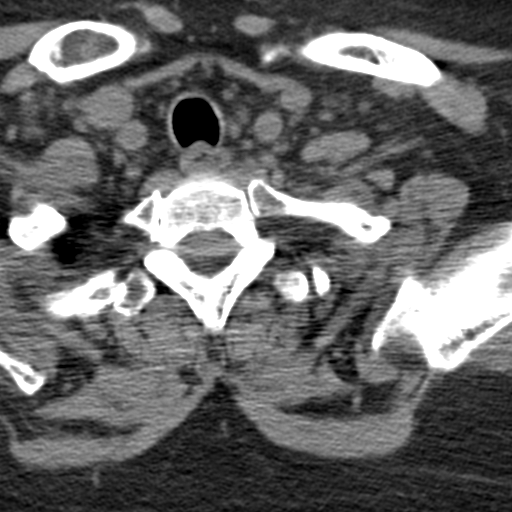
[im 17/100  bone]
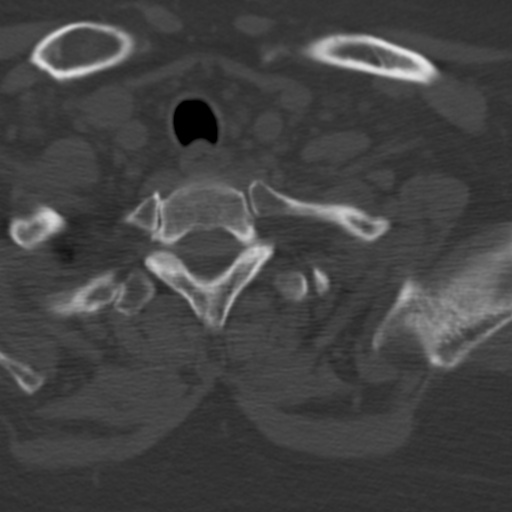
[im 34/100  bone]
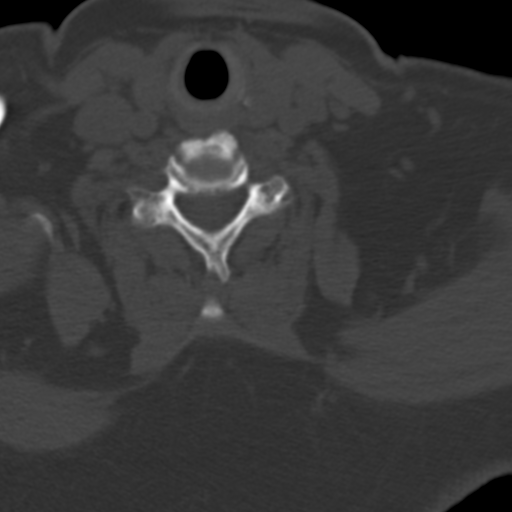
[im 50/100  bone]
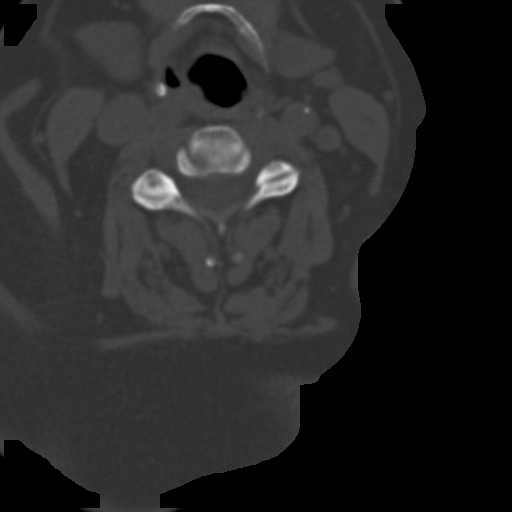
[im 67/100  bone]
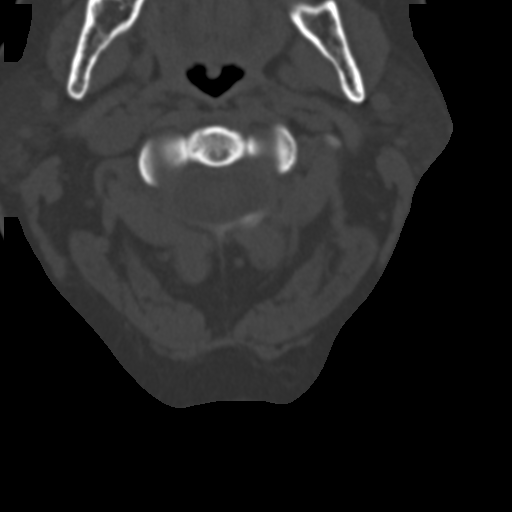
[im 83/100  soft-tissue]
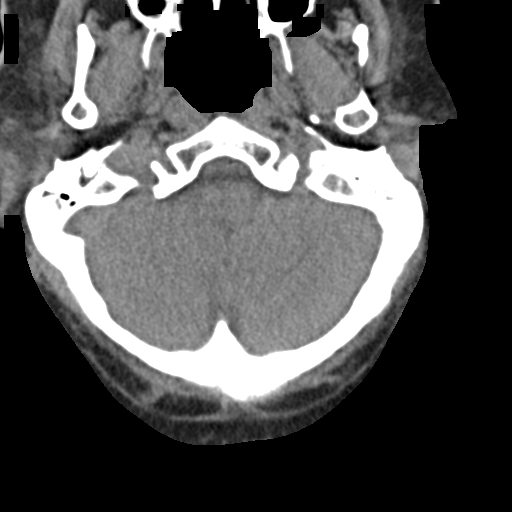
[im 83/100  bone]
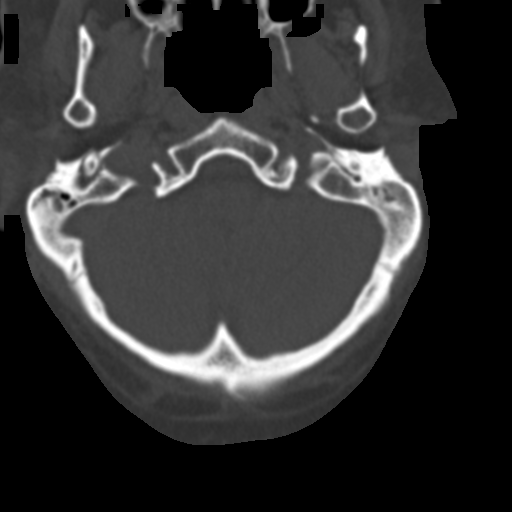

[Series 5: cervical spine sagittal bone · sagittal · 0.30mm/px · 5 of 75 slices shown, 6 images]
[im 25/75  bone]
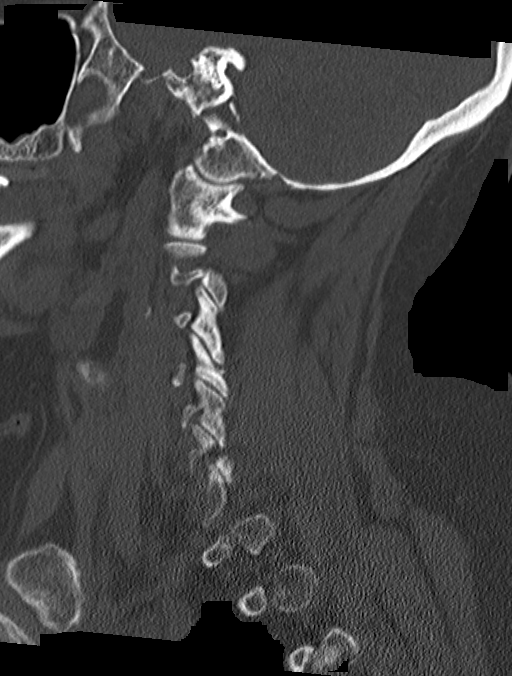
[im 31/75  bone]
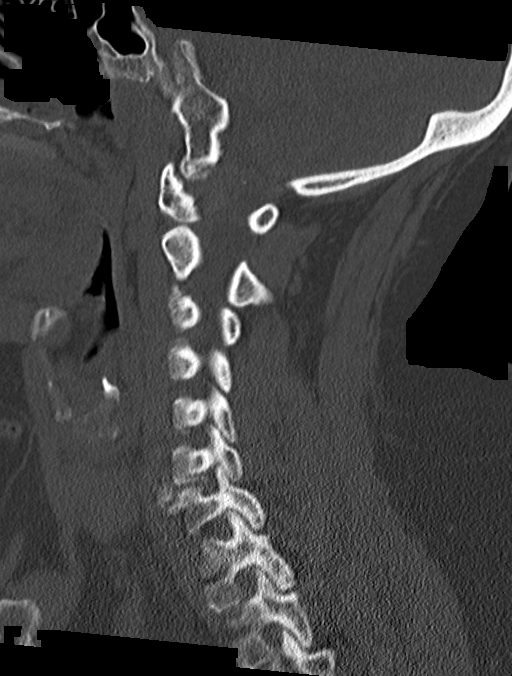
[im 38/75  soft-tissue]
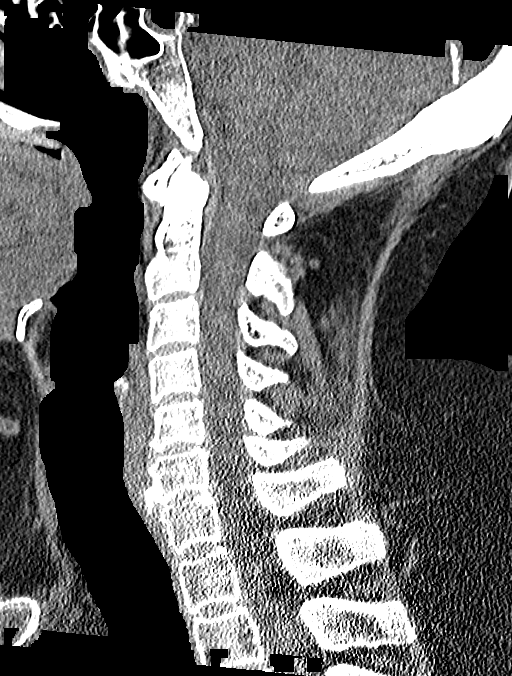
[im 38/75  bone]
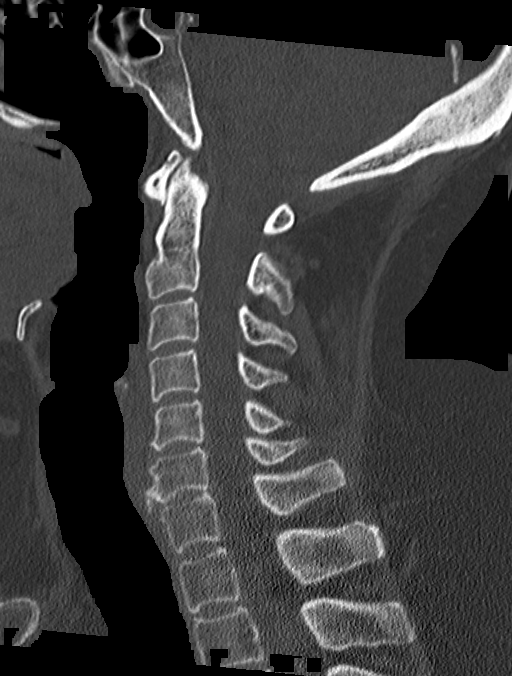
[im 44/75  bone]
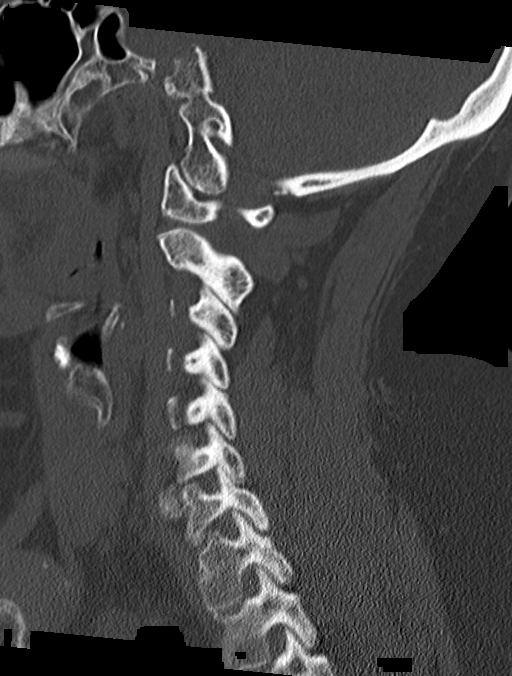
[im 50/75  bone]
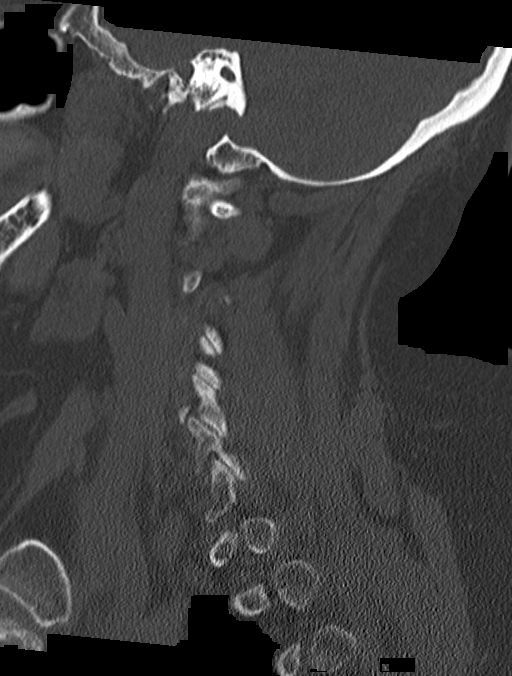

[Series 6: cervical spine coronal bone · coronal · 0.31mm/px · 3 of 82 slices shown]
[im 17/82  bone]
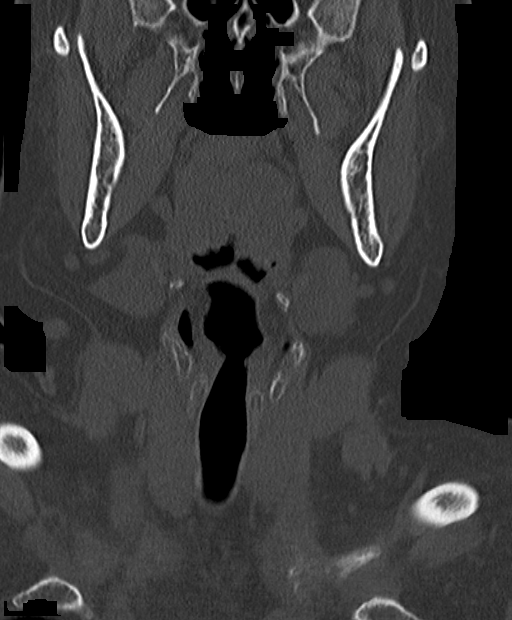
[im 33/82  bone]
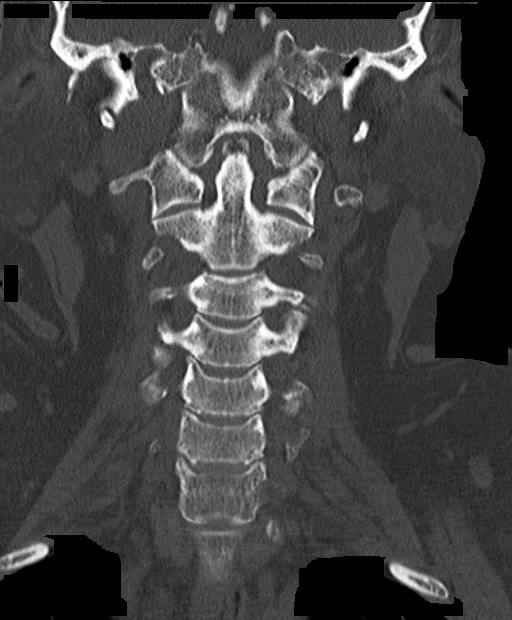
[im 49/82  bone]
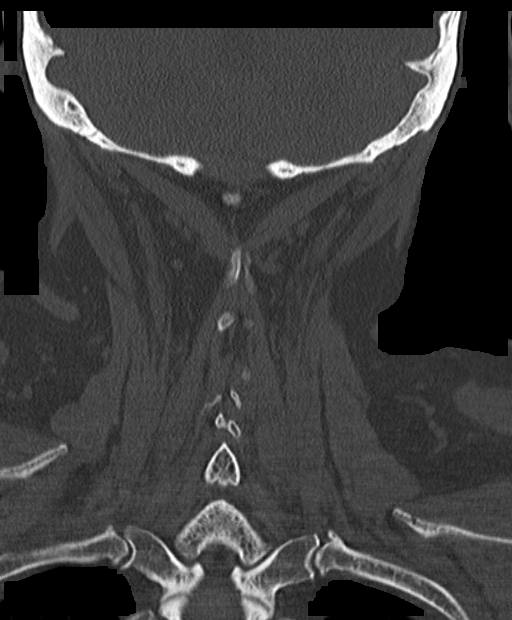

[13 of 33 positions shown; findings below may reference images not displayed]

FINDINGS: Axial images of the cervical spine shows no acute
fracture or subluxation.  There is no pneumothorax in visualized
lung apices.

Sagittal reconstructed images shows no acute fracture or
subluxation.  Degenerative changes are noted C1-C2 articulation.
No prevertebral soft tissue swelling.  There is a mild disc space
flattening with large anterior osteophytes at C5-C6 and C6-C7
level.  No prevertebral soft tissue swelling.  Cervical airway is
patent.

Coronal reconstructed images shows no acute fracture or
subluxation.
IMPRESSION: No acute fracture or subluxation.  Degenerative changes as
described above.

## 2013-03-21 IMAGING — CR DG HIP (WITH OR WITHOUT PELVIS) 2-3V*L*
3 series · 3 of 3 positions shown · non-contrast
Comparison: 12/12/2007

CLINICAL DATA: Fall.  Left body pain.

LEFT HIP - COMPLETE 2+ VIEW

[view not recorded (1 of 3)]
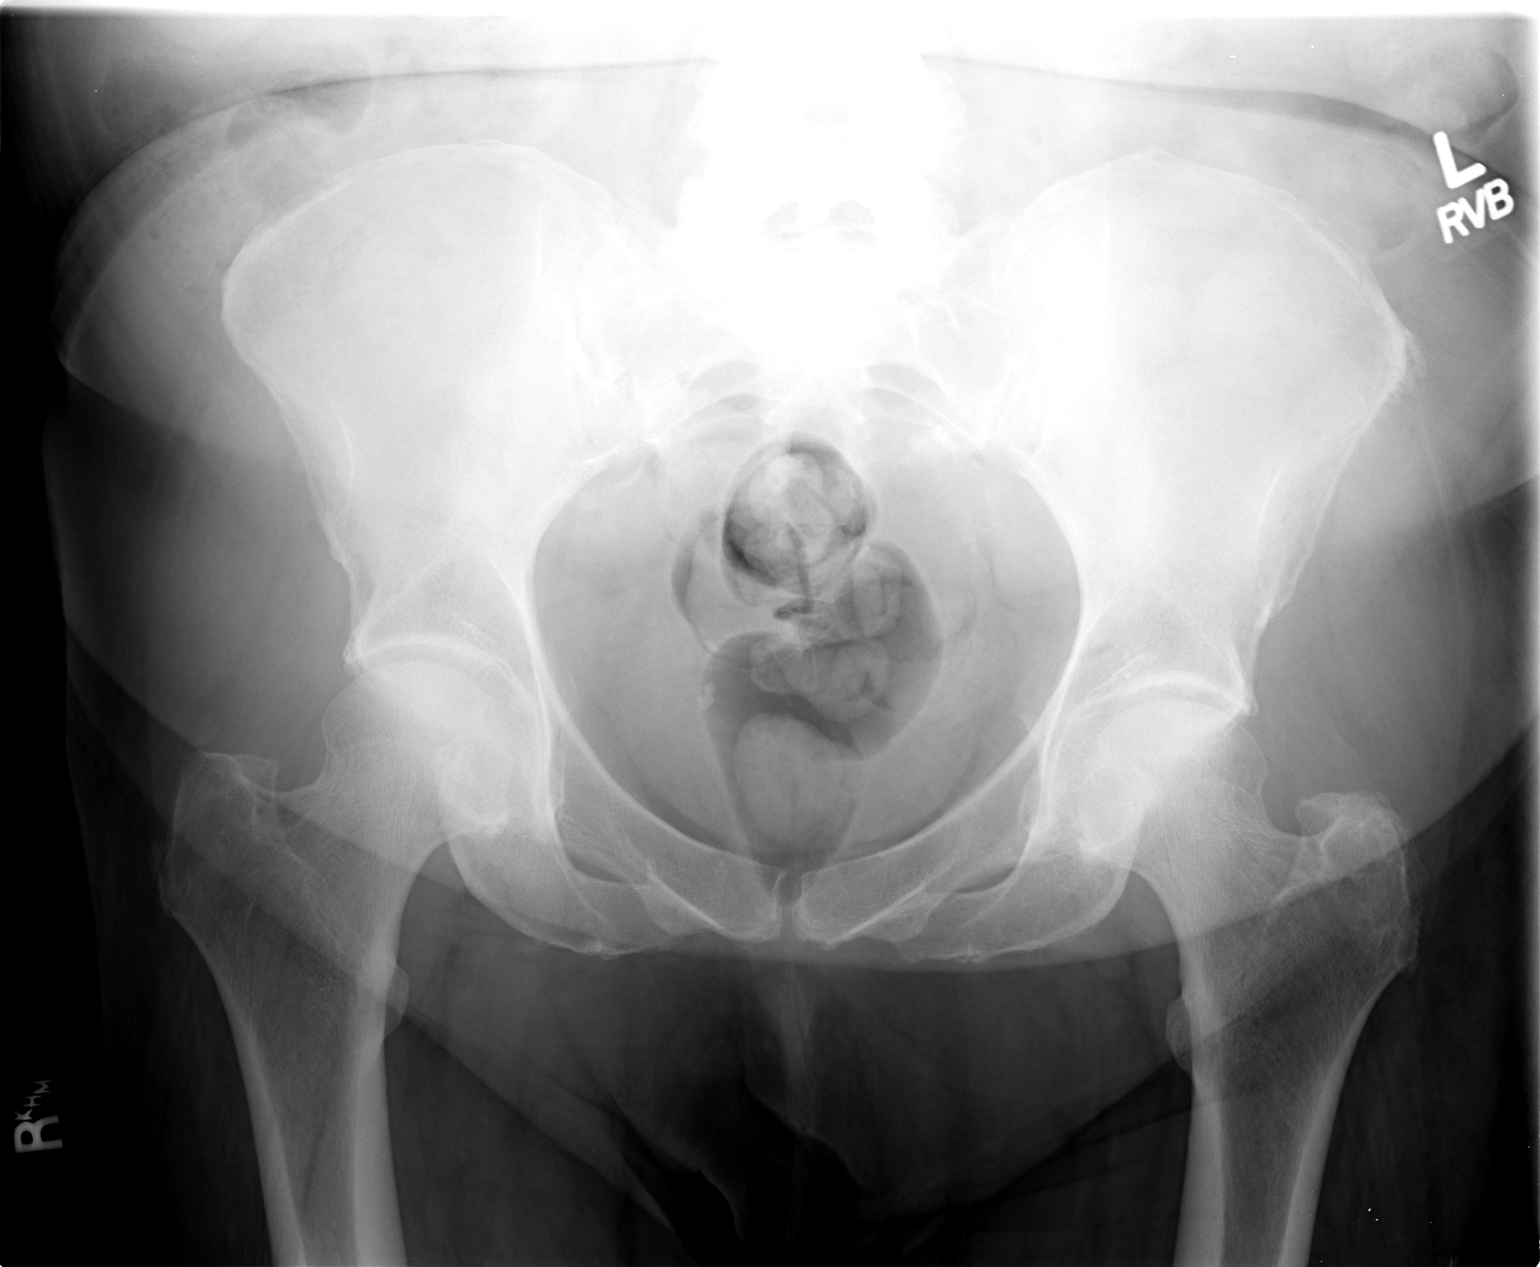

[view not recorded (2 of 3)]
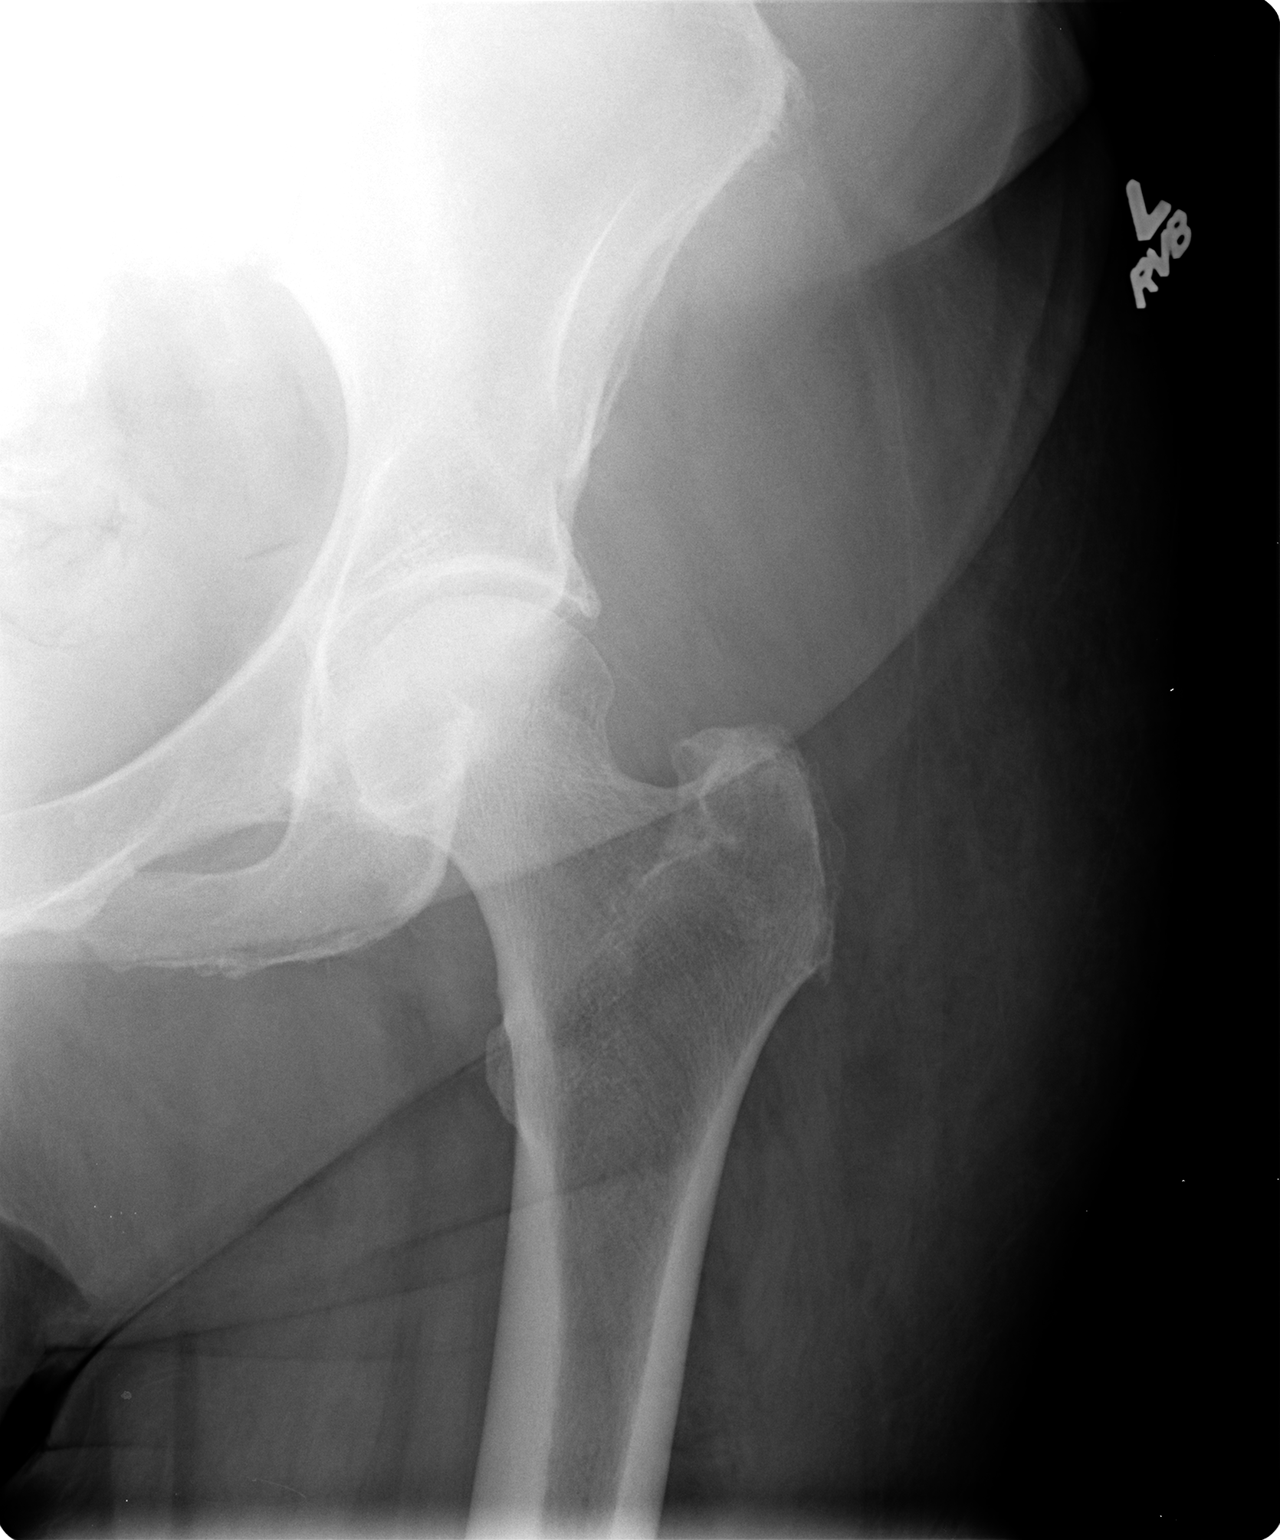

[view not recorded (3 of 3)]
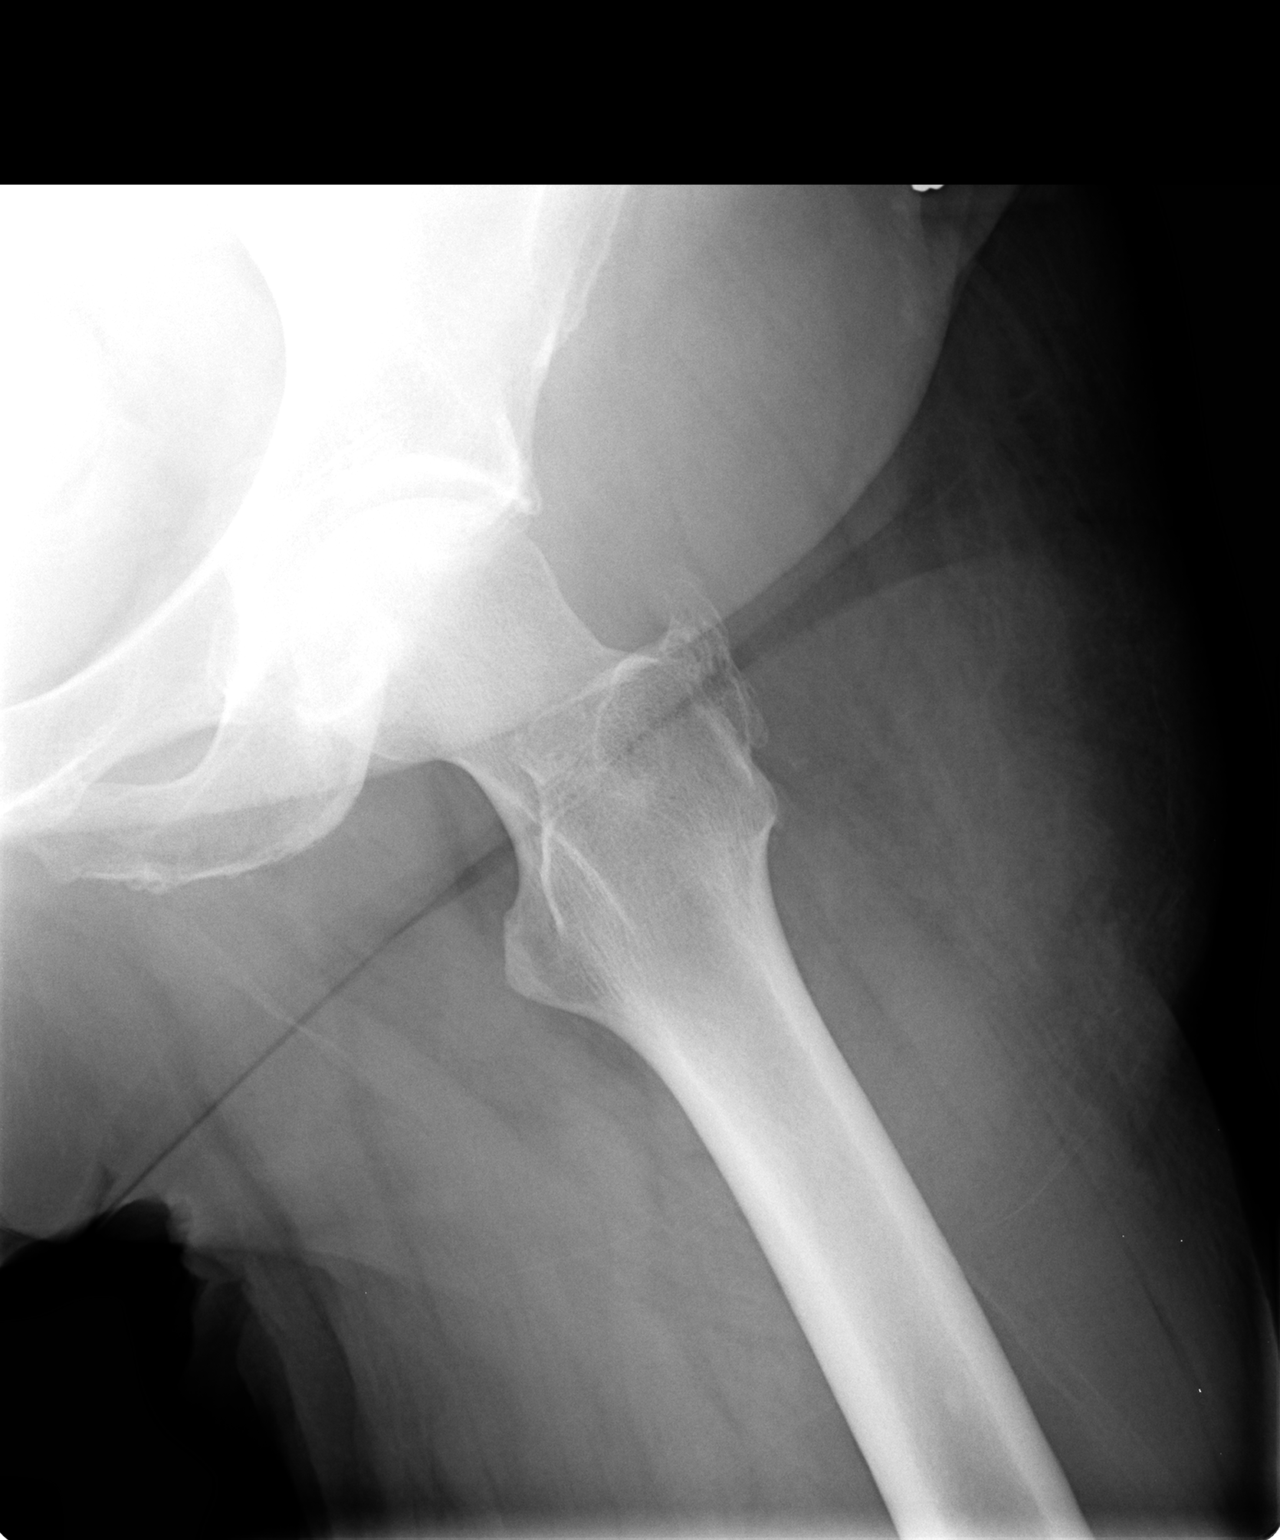

[3 of 3 positions shown; findings below may reference images not displayed]

FINDINGS: Inlet view the pelvis appears unremarkable.  No hip
fracture observed.  Chronic spurring of the greater trochanter
identified.
IMPRESSION: 1.  No acute bony findings.

## 2013-03-21 IMAGING — CR DG RIBS W/ CHEST 3+V*L*
4 series · 4 of 4 positions shown · non-contrast
Comparison: 08/19/2010

CLINICAL DATA: Left posterior rib pain.

LEFT RIBS AND CHEST - 3+ VIEW

[view not recorded (1 of 4)]
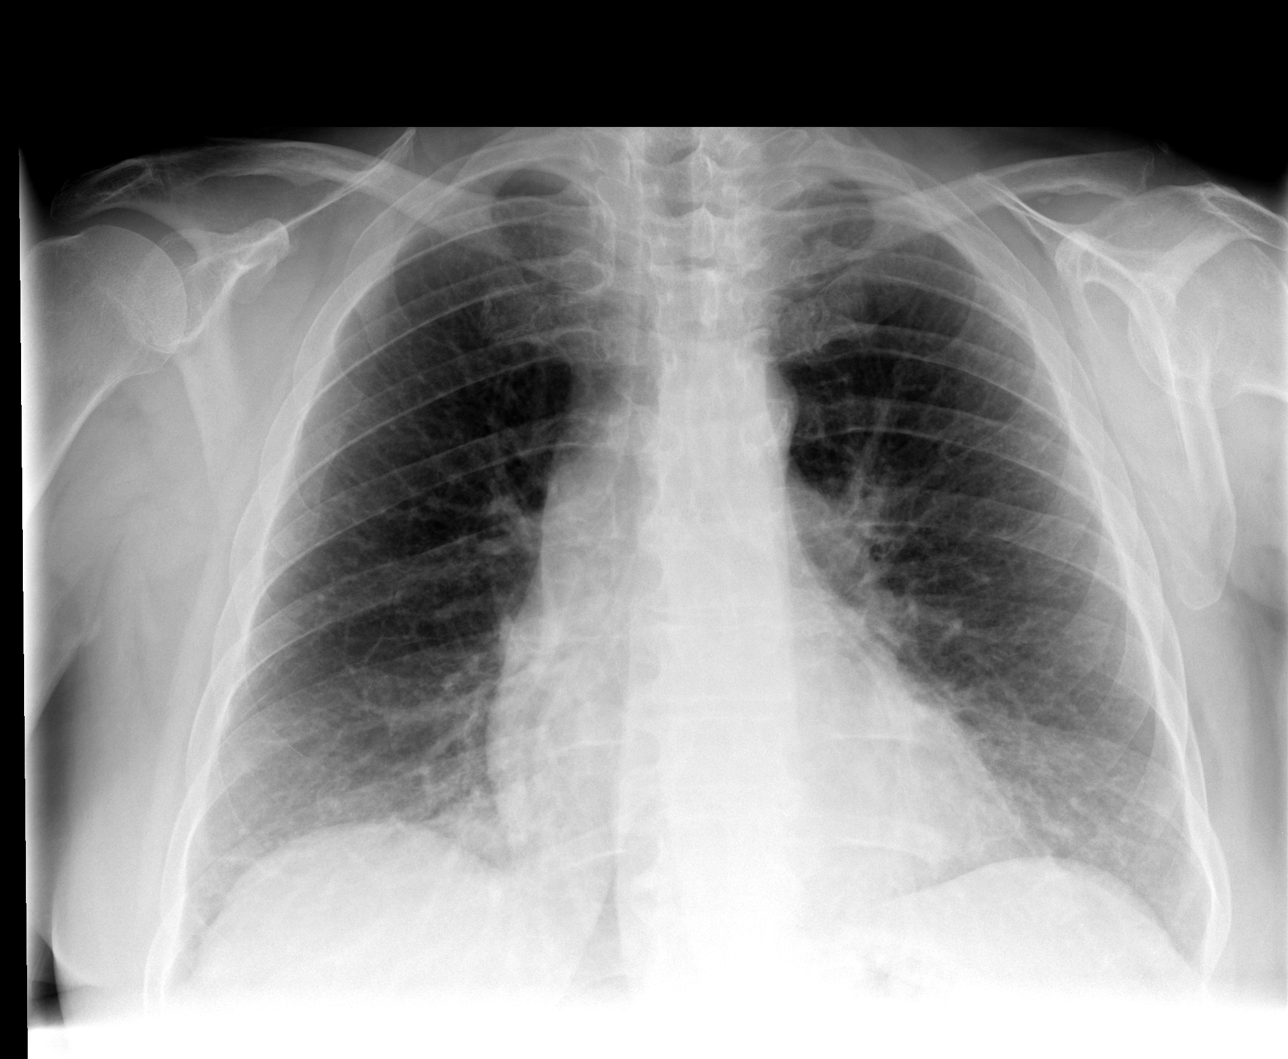

[view not recorded (2 of 4)]
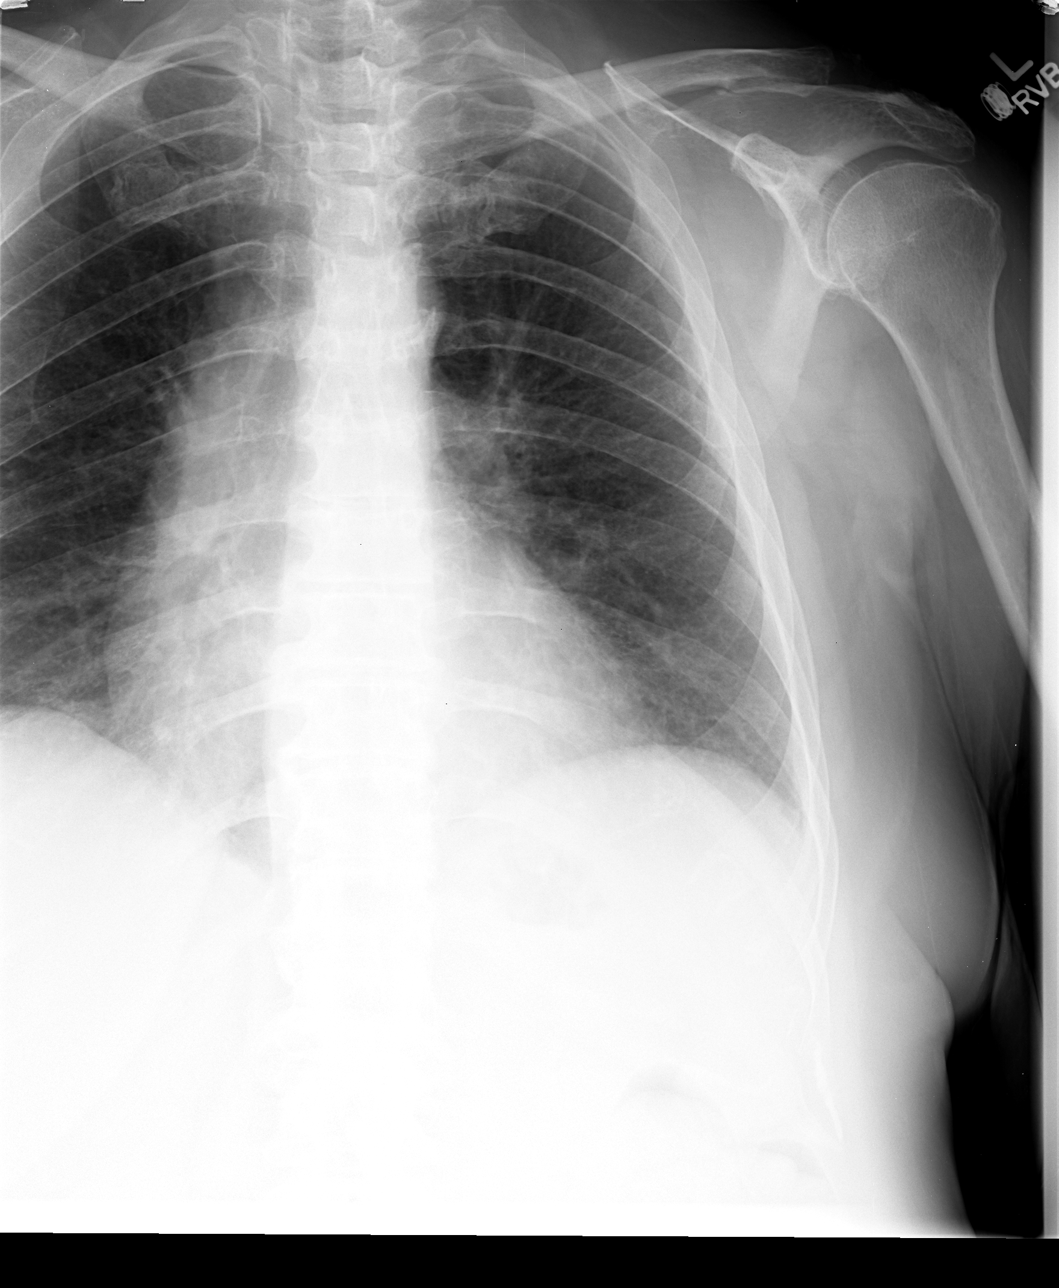

[view not recorded (3 of 4)]
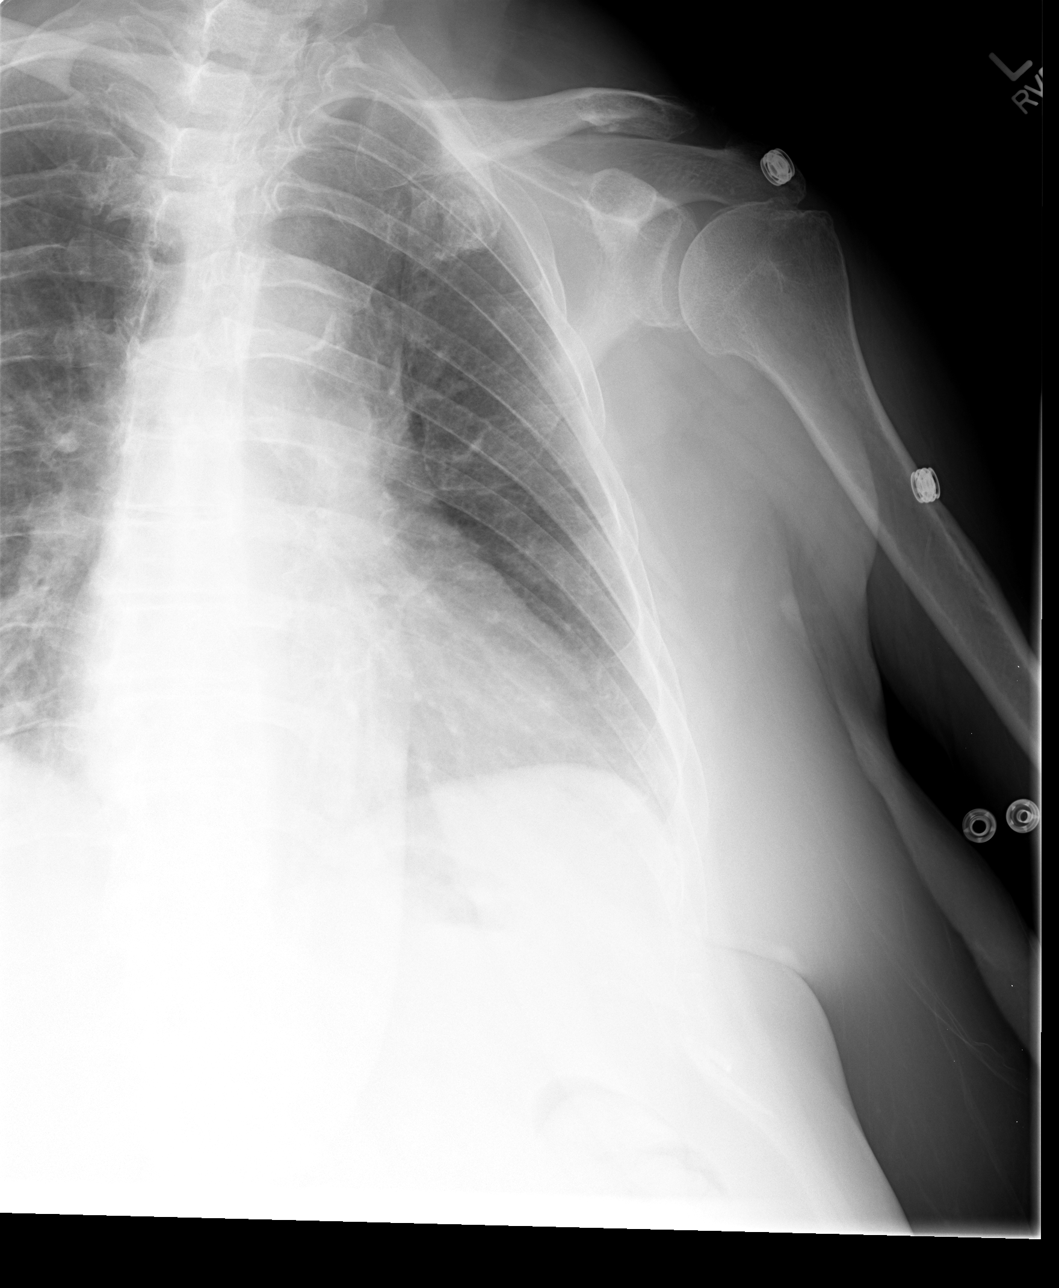

[view not recorded (4 of 4)]
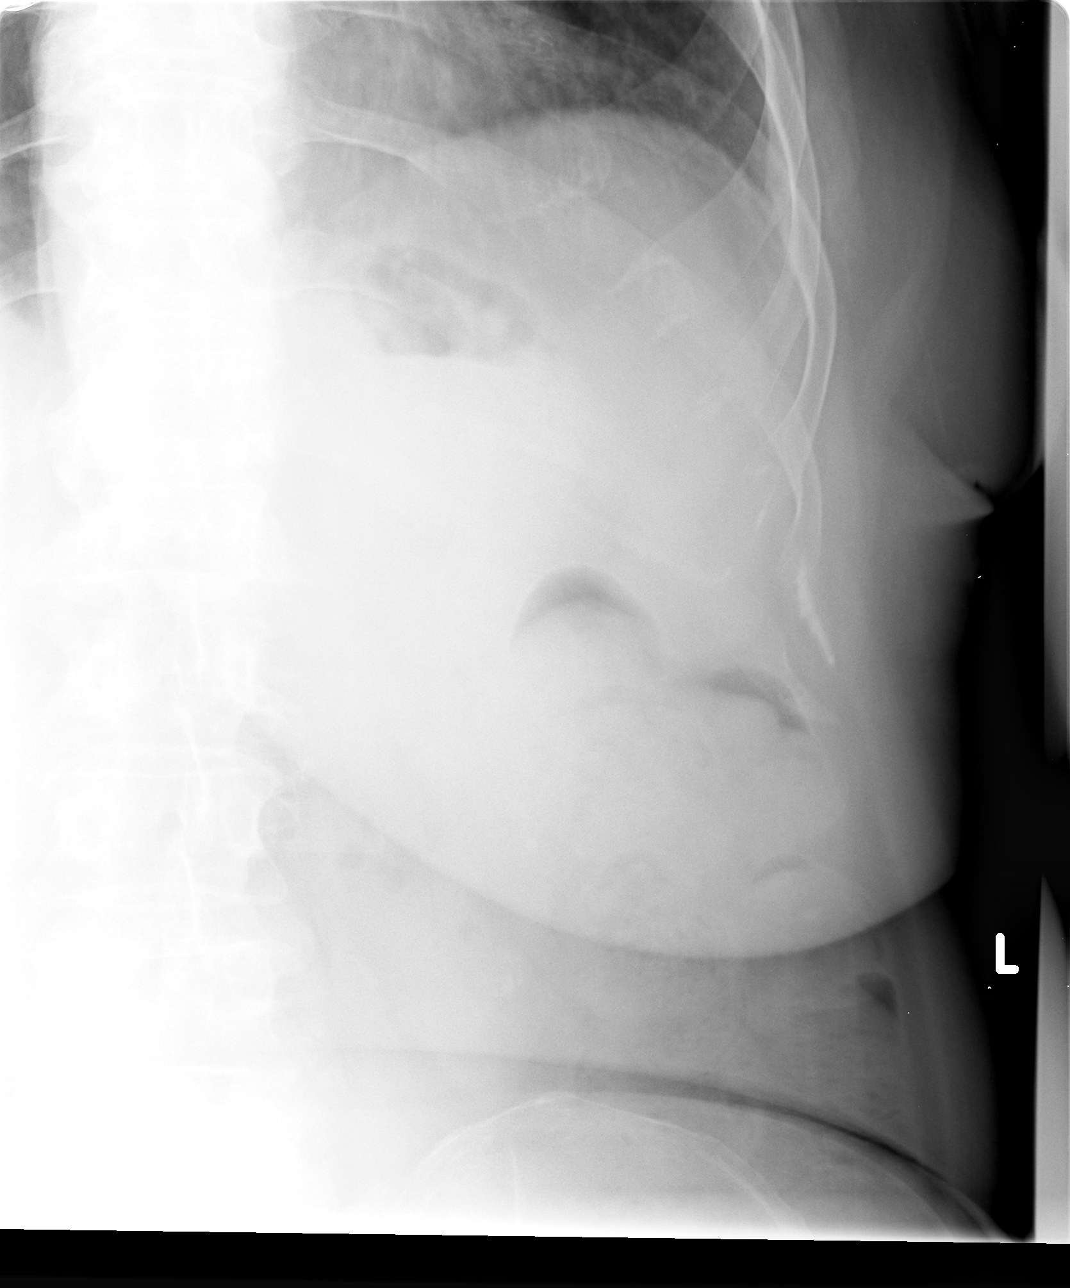

[4 of 4 positions shown; findings below may reference images not displayed]

FINDINGS: Stable interstitial accentuation in the lung bases.
Thoracic spondylosis noted.  There is atherosclerotic calcification
in the aortic arch.

No pneumothorax or pleural effusion identified.  Left AC joint
resection or resorption noted in the past.

No rib fracture identified.
IMPRESSION: 1.  No rib fracture identified. Please note that nondisplaced rib
fractures can be occult on conventional radiography.
2.  Thoracic spondylosis.
3.  Left AC joint resection or resorption noted.

## 2013-04-01 ENCOUNTER — Other Ambulatory Visit: Payer: Self-pay | Admitting: Neurology

## 2013-04-01 DIAGNOSIS — R269 Unspecified abnormalities of gait and mobility: Secondary | ICD-10-CM

## 2013-04-03 ENCOUNTER — Ambulatory Visit (HOSPITAL_COMMUNITY)
Admission: RE | Admit: 2013-04-03 | Discharge: 2013-04-03 | Disposition: A | Discharge status: Home / Self Care | Payer: Medicare Other | Source: Ambulatory Visit | Attending: Neurology | Admitting: Neurology

## 2013-04-03 DIAGNOSIS — R269 Unspecified abnormalities of gait and mobility: Secondary | ICD-10-CM | POA: Insufficient documentation

## 2013-04-03 DIAGNOSIS — I6529 Occlusion and stenosis of unspecified carotid artery: Secondary | ICD-10-CM | POA: Insufficient documentation

## 2013-04-30 ENCOUNTER — Other Ambulatory Visit (HOSPITAL_COMMUNITY): Payer: Self-pay | Admitting: Psychiatry

## 2013-05-01 ENCOUNTER — Other Ambulatory Visit (HOSPITAL_COMMUNITY): Payer: Self-pay | Admitting: Psychiatry

## 2013-05-07 ENCOUNTER — Telehealth (HOSPITAL_COMMUNITY): Payer: Self-pay

## 2013-05-13 NOTE — Telephone Encounter (Signed)
Per Dr. Adele Schilder medication refill that has been requested is not appropriate. Pt has appointment scheduled for 05/14/13 and he will discuss medications at that time.

## 2013-05-14 ENCOUNTER — Ambulatory Visit (HOSPITAL_COMMUNITY): Payer: Medicare Other | Admitting: Psychiatry

## 2013-05-14 ENCOUNTER — Ambulatory Visit (INDEPENDENT_AMBULATORY_CARE_PROVIDER_SITE_OTHER): Payer: Medicare Other | Admitting: Psychiatry

## 2013-05-14 ENCOUNTER — Encounter (HOSPITAL_COMMUNITY): Payer: Self-pay | Admitting: Psychiatry

## 2013-05-14 VITALS — BP 130/90 | HR 80 | Wt 163.0 lb

## 2013-05-14 DIAGNOSIS — F319 Bipolar disorder, unspecified: Secondary | ICD-10-CM

## 2013-05-14 MED ORDER — ARIPIPRAZOLE 5 MG PO TABS: 5.0000 mg | ORAL_TABLET | Freq: Every day | ORAL | Status: DC

## 2013-05-14 MED ORDER — TRAZODONE HCL 100 MG PO TABS: 100.0000 mg | ORAL_TABLET | Freq: Every day | ORAL | Status: DC

## 2013-05-14 MED ORDER — CLONAZEPAM 0.5 MG PO TABS: ORAL_TABLET | ORAL | Status: DC

## 2013-05-14 MED ORDER — DULOXETINE HCL 20 MG PO CPEP: 20.0000 mg | ORAL_CAPSULE | Freq: Two times a day (BID) | ORAL | Status: DC
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
## 2013-06-02 ENCOUNTER — Inpatient Hospital Stay (HOSPITAL_COMMUNITY)
Admission: AD | Admit: 2013-06-02 | Discharge: 2013-06-05 | DRG: 641 | Disposition: A | Discharge status: Skilled Nursing Facility | Payer: Medicare HMO | Source: Ambulatory Visit | Attending: Internal Medicine | Admitting: Internal Medicine

## 2013-06-02 DIAGNOSIS — D649 Anemia, unspecified: Secondary | ICD-10-CM | POA: Diagnosis present

## 2013-06-02 DIAGNOSIS — F17200 Nicotine dependence, unspecified, uncomplicated: Secondary | ICD-10-CM | POA: Diagnosis present

## 2013-06-02 DIAGNOSIS — I2510 Atherosclerotic heart disease of native coronary artery without angina pectoris: Secondary | ICD-10-CM | POA: Diagnosis present

## 2013-06-02 DIAGNOSIS — IMO0001 Reserved for inherently not codable concepts without codable children: Secondary | ICD-10-CM | POA: Diagnosis not present

## 2013-06-02 DIAGNOSIS — R269 Unspecified abnormalities of gait and mobility: Secondary | ICD-10-CM | POA: Diagnosis present

## 2013-06-02 DIAGNOSIS — M5137 Other intervertebral disc degeneration, lumbosacral region: Secondary | ICD-10-CM | POA: Diagnosis present

## 2013-06-02 DIAGNOSIS — R609 Edema, unspecified: Secondary | ICD-10-CM | POA: Diagnosis present

## 2013-06-02 DIAGNOSIS — I10 Essential (primary) hypertension: Secondary | ICD-10-CM | POA: Diagnosis present

## 2013-06-02 DIAGNOSIS — E039 Hypothyroidism, unspecified: Secondary | ICD-10-CM | POA: Diagnosis present

## 2013-06-02 DIAGNOSIS — I498 Other specified cardiac arrhythmias: Secondary | ICD-10-CM | POA: Diagnosis present

## 2013-06-02 DIAGNOSIS — S68419A Complete traumatic amputation of unspecified hand at wrist level, initial encounter: Secondary | ICD-10-CM

## 2013-06-02 DIAGNOSIS — G609 Hereditary and idiopathic neuropathy, unspecified: Secondary | ICD-10-CM | POA: Diagnosis present

## 2013-06-02 DIAGNOSIS — Z90710 Acquired absence of both cervix and uterus: Secondary | ICD-10-CM

## 2013-06-02 DIAGNOSIS — E875 Hyperkalemia: Secondary | ICD-10-CM | POA: Diagnosis not present

## 2013-06-02 DIAGNOSIS — Z794 Long term (current) use of insulin: Secondary | ICD-10-CM

## 2013-06-02 DIAGNOSIS — F319 Bipolar disorder, unspecified: Secondary | ICD-10-CM

## 2013-06-02 DIAGNOSIS — E871 Hypo-osmolality and hyponatremia: Secondary | ICD-10-CM | POA: Diagnosis present

## 2013-06-02 DIAGNOSIS — E785 Hyperlipidemia, unspecified: Secondary | ICD-10-CM | POA: Diagnosis present

## 2013-06-02 DIAGNOSIS — F411 Generalized anxiety disorder: Secondary | ICD-10-CM | POA: Diagnosis present

## 2013-06-02 DIAGNOSIS — K219 Gastro-esophageal reflux disease without esophagitis: Secondary | ICD-10-CM | POA: Diagnosis present

## 2013-06-02 DIAGNOSIS — J449 Chronic obstructive pulmonary disease, unspecified: Secondary | ICD-10-CM | POA: Diagnosis present

## 2013-06-02 DIAGNOSIS — J4489 Other specified chronic obstructive pulmonary disease: Secondary | ICD-10-CM | POA: Diagnosis present

## 2013-06-02 DIAGNOSIS — Z79899 Other long term (current) drug therapy: Secondary | ICD-10-CM

## 2013-06-02 DIAGNOSIS — I252 Old myocardial infarction: Secondary | ICD-10-CM

## 2013-06-02 DIAGNOSIS — R001 Bradycardia, unspecified: Secondary | ICD-10-CM | POA: Diagnosis present

## 2013-06-02 DIAGNOSIS — J309 Allergic rhinitis, unspecified: Secondary | ICD-10-CM | POA: Diagnosis present

## 2013-06-02 DIAGNOSIS — Z86718 Personal history of other venous thrombosis and embolism: Secondary | ICD-10-CM

## 2013-06-02 DIAGNOSIS — M51379 Other intervertebral disc degeneration, lumbosacral region without mention of lumbar back pain or lower extremity pain: Secondary | ICD-10-CM | POA: Diagnosis present

## 2013-06-02 LAB — CBC
Hemoglobin: 8.8 g/dL — ABNORMAL LOW (ref 12.0–15.0)
MCH: 29.9 pg (ref 26.0–34.0)
MCHC: 31.5 g/dL (ref 30.0–36.0)
RDW: 14.6 % (ref 11.5–15.5)

## 2013-06-02 LAB — CREATININE, SERUM: Creatinine, Ser: 1.49 mg/dL — ABNORMAL HIGH (ref 0.50–1.10)

## 2013-06-02 MED ORDER — INSULIN ASPART 100 UNIT/ML ~~LOC~~ SOLN
0.0000 [IU] | Freq: Three times a day (TID) | SUBCUTANEOUS | Status: DC
  Administered 2013-06-04 (×2): 1 [IU] via SUBCUTANEOUS

## 2013-06-02 MED ORDER — SODIUM CHLORIDE 0.9 % IJ SOLN: 3.0000 mL | INTRAMUSCULAR | Status: DC | PRN

## 2013-06-02 MED ORDER — ACETAMINOPHEN 325 MG PO TABS
650.0000 mg | ORAL_TABLET | Freq: Four times a day (QID) | ORAL | Status: DC | PRN
  Administered 2013-06-03 – 2013-06-05 (×4): 650 mg via ORAL
  Filled 2013-06-02 (×4): qty 2

## 2013-06-02 MED ORDER — ACETAMINOPHEN 650 MG RE SUPP: 650.0000 mg | Freq: Four times a day (QID) | RECTAL | Status: DC | PRN

## 2013-06-02 MED ORDER — SODIUM CHLORIDE 0.9 % IJ SOLN
3.0000 mL | Freq: Two times a day (BID) | INTRAMUSCULAR | Status: DC
  Administered 2013-06-02 – 2013-06-05 (×6): 3 mL via INTRAVENOUS

## 2013-06-02 MED ORDER — INSULIN ASPART 100 UNIT/ML ~~LOC~~ SOLN: 0.0000 [IU] | Freq: Every day | SUBCUTANEOUS | Status: DC

## 2013-06-02 MED ORDER — SODIUM CHLORIDE 0.9 % IV SOLN: 250.0000 mL | INTRAVENOUS | Status: DC | PRN

## 2013-06-02 MED ORDER — ENOXAPARIN SODIUM 40 MG/0.4ML ~~LOC~~ SOLN
40.0000 mg | SUBCUTANEOUS | Status: DC
  Administered 2013-06-02 – 2013-06-04 (×3): 40 mg via SUBCUTANEOUS
  Filled 2013-06-02 (×3): qty 0.4

## 2013-06-02 MED ORDER — FUROSEMIDE 10 MG/ML IJ SOLN
20.0000 mg | Freq: Every day | INTRAMUSCULAR | Status: DC
  Administered 2013-06-02 – 2013-06-05 (×4): 20 mg via INTRAVENOUS
  Filled 2013-06-02 (×6): qty 2

## 2013-06-02 NOTE — H&P (Signed)
[name] is an 62 y.o. female.   Chief Complaint: Edema HPI: 62 yo female with dm2, CAD, Hypothyroidism , DVT apparently has had refractory edema to outpatient lasix.  Pt also hyponatremic and therefore needs diuresis with close monitoring of sodium.  Pt denies cp, palp, sob, orthopnea, pnd.  Pt had 2+ edema pitting to the knees bilaterally and will be admitted for diuresis as stated above.  No note of pain or erythema, or warmth.   Past Medical History  Diagnosis Date  . Diabetes mellitus   . COPD (chronic obstructive pulmonary disease)   . ASCVD (arteriosclerotic cardiovascular disease)     MI in 96 requiring BMS CX; DES to M1 in 2000;normal coronary angiography in 2004  . Hyperlipidemia   . Hypothyroidism   . Tobacco abuse   . Depression   . DVT (deep venous thrombosis)   . Nephrolithiasis 2006    stone extraction   . Amputation of hand, right     traumatic  . Allergic rhinitis   . Cholelithiasis   . GERD (gastroesophageal reflux disease)   . Low back pain   . Peripheral neuropathy   . Diabetes mellitus type I   . DDD (degenerative disc disease), lumbar   . Sciatic pain     right  . Tremor     Past Surgical History  Procedure Laterality Date  . Dilation and curettage of uterus  1974  . Partial hysterectomy  1978  . Total abdominal hysterectomy w/ bilateral salpingoophorectomy  2002  . Shoulder surgery      Left shoulder for RTC;left arm surgery '98/left hand surgery 2001  . Cholecystectomy    . Umbilical hernia repair  2008  . Colonoscopy  01/2009    nl repeat in 5  years  . Back surgery    . Hand amputation Right     traumatic    Family History  Problem Relation Age of Onset  . Depression Mother   . Bipolar disorder Mother   . Dementia Mother   . Alcohol abuse Father    Social History:  reports that she has been smoking Cigarettes.  She has a 20 pack-year smoking history. She does not have any smokeless tobacco history on file. She reports that she does  not drink alcohol or use illicit drugs.  Allergies:  Allergies  Allergen Reactions  . Ciprofloxacin     REACTION: Rash  . Iohexol      Desc: CHEST TIGHTNESS,BRETHING PROBLEMS NEEDS PRE MEDS   . Neomycin-Bacitracin Zn-Polymyx     REACTION: Rash  . Penicillins     REACTION: Rash  . Povidone     REACTION: Rash    Medications Prior to Admission  Medication Sig Dispense Refill  . ARIPiprazole (ABILIFY) 5 MG tablet Take 1 tablet (5 mg total) by mouth daily.  30 tablet  1  . aspirin 81 MG chewable tablet Chew 81 mg by mouth daily.      . benztropine (COGENTIN) 0.5 MG tablet TAKE 1 TABLET (0.5 MG TOTAL)  BY MOUTH DAILY.  30 tablet  0  . clonazePAM (KLONOPIN) 0.5 MG tablet take1 tab bid  60 tablet  1  . colesevelam (WELCHOL) 625 MG tablet Take 1,875 mg by mouth 2 (two) times daily with a meal.        . DULoxetine (CYMBALTA) 20 MG capsule Take 1 capsule (20 mg total) by mouth 2 (two) times daily.  60 capsule  1  . esomeprazole (NEXIUM) 40 MG  capsule Take 40 mg by mouth daily before breakfast.        . fish oil-omega-3 fatty acids 1000 MG capsule Take 2 g by mouth daily.        . iron polysaccharides (NIFEREX) 150 MG capsule Take 1 capsule (150 mg total) by mouth daily.  60 capsule  2  . levothyroxine (SYNTHROID, LEVOTHROID) 200 MCG tablet Take 400 mcg by mouth daily.      Marland Kitchen lisinopril (PRINIVIL,ZESTRIL) 20 MG tablet Take 20 mg by mouth daily.        . metFORMIN (GLUCOPHAGE) 500 MG tablet Take 500 mg by mouth 2 (two) times daily.      . metoprolol (LOPRESSOR) 50 MG tablet Take 25 mg by mouth 2 (two) times daily.       . niacin (NIASPAN) 1000 MG CR tablet Take 1,000 mg by mouth at bedtime.        . nitroGLYCERIN (NITROSTAT) 0.4 MG SL tablet Place 0.4 mg under the tongue every 5 (five) minutes as needed. Chest pain      . pravastatin (PRAVACHOL) 40 MG tablet Take 40 mg by mouth daily.        . traMADol (ULTRAM) 50 MG tablet Take 50 mg by mouth daily.      . traZODone (DESYREL) 100 MG tablet  Take 1 tablet (100 mg total) by mouth at bedtime.  30 tablet  1    No results found for this or any previous visit (from the past 48 hour(s)). No results found.  ROS negative for all organ systems except for + above  Blood pressure 146/65, pulse 58, temperature 97.9 F (36.6 C), temperature source Oral, resp. rate 16, weight 179 lb 0.2 oz (81.2 kg), SpO2 97.00%. Physical Exam  Heent: anicteric Neck: no jvd, no bruit, no tm,  Heart: rrr s1, s2 Lung: ctab Abd: soft, obese, nt, nd, +bs Ext: no c/c/  2+ pitting edema to the knee bilaterally, no erythema, no warmth,  Msk: amputation of the right hand Neuro:  Slightly unsteady gait  Assessment/Plan Edema:  Start on lasix 14m iv qday Hyponatremia, check serum osm, tsh, cortisol, urine sodium, urine osm,  May need to replete with Ns gently.  Copd: cont current breathing treatments Dm2: fsbs ac and qhs, senstive sliding scale CAD:  Cont metoprolol, lisinopril aspirin, pravastatin Gerd: cont omeprazole Hypothyroidism: cont levothyroxine  KJani Gravel8/19/2014, 7:54 PM

## 2013-06-03 ENCOUNTER — Encounter (HOSPITAL_COMMUNITY): Payer: Self-pay | Admitting: General Practice

## 2013-06-03 LAB — CORTISOL: Cortisol, Plasma: 11.9 ug/dL

## 2013-06-03 LAB — CBC
HCT: 28.8 % — ABNORMAL LOW (ref 36.0–46.0)
Platelets: 202 10*3/uL (ref 150–400)
RDW: 14.7 % (ref 11.5–15.5)
WBC: 5.7 10*3/uL (ref 4.0–10.5)

## 2013-06-03 LAB — COMPREHENSIVE METABOLIC PANEL
Alkaline Phosphatase: 92 U/L (ref 39–117)
BUN: 21 mg/dL (ref 6–23)
GFR calc Af Amer: 49 mL/min — ABNORMAL LOW (ref >90)
Glucose, Bld: 97 mg/dL (ref 70–99)
Potassium: 5.6 mEq/L — ABNORMAL HIGH (ref 3.5–5.1)
Total Bilirubin: 0.2 mg/dL — ABNORMAL LOW (ref 0.3–1.2)
Total Protein: 6.3 g/dL (ref 6.0–8.3)

## 2013-06-03 LAB — OSMOLALITY: Osmolality: 295 mOsm/kg (ref 275–300)

## 2013-06-03 MED ORDER — METOPROLOL TARTRATE 25 MG PO TABS
25.0000 mg | ORAL_TABLET | Freq: Two times a day (BID) | ORAL | Status: DC
  Administered 2013-06-03: 25 mg via ORAL
  Filled 2013-06-03 (×2): qty 1

## 2013-06-03 MED ORDER — SIMVASTATIN 10 MG PO TABS
10.0000 mg | ORAL_TABLET | Freq: Every day | ORAL | Status: DC
  Administered 2013-06-03 – 2013-06-04 (×2): 10 mg via ORAL
  Filled 2013-06-03 (×2): qty 1

## 2013-06-03 MED ORDER — LEVOTHYROXINE SODIUM 100 MCG PO TABS
400.0000 ug | ORAL_TABLET | Freq: Every day | ORAL | Status: DC
  Administered 2013-06-04 – 2013-06-05 (×2): 400 ug via ORAL
  Filled 2013-06-03 (×2): qty 4

## 2013-06-03 MED ORDER — LISINOPRIL 10 MG PO TABS
20.0000 mg | ORAL_TABLET | Freq: Every day | ORAL | Status: DC
  Administered 2013-06-03 – 2013-06-04 (×2): 20 mg via ORAL
  Filled 2013-06-03 (×2): qty 2

## 2013-06-03 MED ORDER — METFORMIN HCL 500 MG PO TABS
500.0000 mg | ORAL_TABLET | Freq: Two times a day (BID) | ORAL | Status: DC
  Administered 2013-06-03 – 2013-06-05 (×4): 500 mg via ORAL
  Filled 2013-06-03 (×4): qty 1

## 2013-06-03 MED ORDER — ARIPIPRAZOLE 10 MG PO TABS
5.0000 mg | ORAL_TABLET | Freq: Every day | ORAL | Status: DC
  Administered 2013-06-03 – 2013-06-05 (×3): 5 mg via ORAL
  Filled 2013-06-03 (×3): qty 1

## 2013-06-03 MED ORDER — HYDRALAZINE HCL 20 MG/ML IJ SOLN
10.0000 mg | Freq: Four times a day (QID) | INTRAMUSCULAR | Status: DC | PRN
  Administered 2013-06-04 – 2013-06-05 (×3): 10 mg via INTRAVENOUS
  Filled 2013-06-03 (×3): qty 1

## 2013-06-03 MED ORDER — PANTOPRAZOLE SODIUM 40 MG PO TBEC
40.0000 mg | DELAYED_RELEASE_TABLET | Freq: Every day | ORAL | Status: DC
  Administered 2013-06-03 – 2013-06-05 (×3): 40 mg via ORAL
  Filled 2013-06-03 (×3): qty 1

## 2013-06-03 MED ORDER — SODIUM POLYSTYRENE SULFONATE 15 GM/60ML PO SUSP
15.0000 g | Freq: Once | ORAL | Status: DC
  Filled 2013-06-03: qty 60

## 2013-06-03 MED ORDER — TRAMADOL HCL 50 MG PO TABS
50.0000 mg | ORAL_TABLET | Freq: Every day | ORAL | Status: DC
  Administered 2013-06-03 – 2013-06-05 (×3): 50 mg via ORAL
  Filled 2013-06-03 (×3): qty 1

## 2013-06-03 MED ORDER — ASPIRIN 81 MG PO CHEW
81.0000 mg | CHEWABLE_TABLET | Freq: Every day | ORAL | Status: DC
  Administered 2013-06-03 – 2013-06-05 (×3): 81 mg via ORAL
  Filled 2013-06-03 (×3): qty 1

## 2013-06-03 MED ORDER — BENZTROPINE MESYLATE 1 MG PO TABS
1.0000 mg | ORAL_TABLET | Freq: Every day | ORAL | Status: DC
  Administered 2013-06-03 – 2013-06-04 (×2): 1 mg via ORAL
  Filled 2013-06-03 (×2): qty 1

## 2013-06-03 MED ORDER — POLYSACCHARIDE IRON COMPLEX 150 MG PO CAPS
150.0000 mg | ORAL_CAPSULE | Freq: Every day | ORAL | Status: DC
  Administered 2013-06-03 – 2013-06-05 (×3): 150 mg via ORAL
  Filled 2013-06-03 (×3): qty 1

## 2013-06-03 MED ORDER — DULOXETINE HCL 20 MG PO CPEP
20.0000 mg | ORAL_CAPSULE | Freq: Two times a day (BID) | ORAL | Status: DC
  Administered 2013-06-03 – 2013-06-05 (×5): 20 mg via ORAL
  Filled 2013-06-03 (×11): qty 1

## 2013-06-03 MED ORDER — TRAZODONE HCL 50 MG PO TABS
100.0000 mg | ORAL_TABLET | Freq: Every day | ORAL | Status: DC
  Administered 2013-06-03 – 2013-06-04 (×2): 100 mg via ORAL
  Filled 2013-06-03 (×2): qty 2

## 2013-06-03 MED ORDER — COLESEVELAM HCL 625 MG PO TABS
1875.0000 mg | ORAL_TABLET | Freq: Two times a day (BID) | ORAL | Status: DC
  Administered 2013-06-03 – 2013-06-05 (×4): 1875 mg via ORAL
  Filled 2013-06-03 (×10): qty 3

## 2013-06-03 MED ORDER — CLONAZEPAM 0.5 MG PO TABS: 0.5000 mg | ORAL_TABLET | Freq: Two times a day (BID) | ORAL | Status: DC | PRN

## 2013-06-03 NOTE — Progress Notes (Signed)
Patient refused Kayexlate, RN made MD aware, no further orders at this time, Charna Busman, RN

## 2013-06-03 NOTE — Progress Notes (Signed)
Subjective: Edema:  Improved after iv lasix,  Pt has difficulty getting ted hose on due to amputation of the right hand,  Hyponatremia: resolved Hypertension: uncontrolled.  Denies cp, palp, sob. Hyperkalemia:  Pt receiving kayexalate.  Anemia: fairly stable, denies brbpr, black stool.     Objective: Vital signs in last 24 hours: Temp:  [97.4 F (36.3 C)-98.2 F (36.8 C)] 98.2 F (36.8 C) (08/20 1551) Pulse Rate:  [54-67] 54 (08/20 1551) Resp:  [16-18] 18 (08/20 1551) BP: (148-185)/(62-76) 185/76 mmHg (08/20 1551) SpO2:  [93 %-96 %] 96 % (08/20 1551) Weight:  [171 lb 8.3 oz (77.8 kg)] 171 lb 8.3 oz (77.8 kg) (08/20 0445) Weight change:  Last BM Date: 06/03/13  Intake/Output from previous day:   Intake/Output this shift:    Heent: anicteric Neck: no jvd Heart: rrr s1, s2 Lung: ctab Abd: soft, nt, nd, +bs Ext: no c/c/1+ edema Skin: no rash Lymph: no adenoapthy Neuiro: nonfocal   Lab Results:  Recent Labs  06/02/13 2127 06/03/13 0524  WBC 6.7 5.7  HGB 8.8* 9.3*  HCT 27.9* 28.8*  PLT 208 202   BMET  Recent Labs  06/02/13 2127 06/03/13 0524  NA  --  139  K  --  5.6*  CL  --  106  CO2  --  26  GLUCOSE  --  97  BUN  --  21  CREATININE 1.49* 1.32*  CALCIUM  --  9.6    Studies/Results: No results found.  Medications: I have reviewed the patient's current medications.  Assessment/Plan: Edema: improving on iv lasix,  PT to help with ted nose Unsteady gait: PT/OT to evaluate and tx Hypertension: uncontrolled.  Will continue to monitor. Hydralazine prn Hyperkalemia:  Kayexalate today.  Hyponatremia: resolved, wil continue to monitor since diuresing.  Anemia: check cbc in am Dispo: PT/OT consult, social work consult , hopefully SNF tomorrow, [facility] to continue to manage edema, help assist in getting ted  Hose on. unsteady gait  LOS: 1 day   Jani Gravel 06/03/2013, 7:17 PM

## 2013-06-04 LAB — CBC
HCT: 28.6 % — ABNORMAL LOW (ref 36.0–46.0)
Hemoglobin: 9.3 g/dL — ABNORMAL LOW (ref 12.0–15.0)
MCH: 30.3 pg (ref 26.0–34.0)
MCHC: 32.5 g/dL (ref 30.0–36.0)
RBC: 3.07 MIL/uL — ABNORMAL LOW (ref 3.87–5.11)

## 2013-06-04 LAB — MRSA PCR SCREENING: MRSA by PCR: NEGATIVE

## 2013-06-04 LAB — GLUCOSE, CAPILLARY
Glucose-Capillary: 140 mg/dL — ABNORMAL HIGH (ref 70–99)
Glucose-Capillary: 125 mg/dL — ABNORMAL HIGH (ref 70–99)
Glucose-Capillary: 127 mg/dL — ABNORMAL HIGH (ref 70–99)
Glucose-Capillary: 88 mg/dL (ref 70–99)

## 2013-06-04 LAB — BASIC METABOLIC PANEL
BUN: 17 mg/dL (ref 6–23)
CO2: 27 mEq/L (ref 19–32)
Chloride: 108 mEq/L (ref 96–112)
GFR calc non Af Amer: 48 mL/min — ABNORMAL LOW (ref >90)
Glucose, Bld: 96 mg/dL (ref 70–99)
Potassium: 4.8 mEq/L (ref 3.5–5.1)
Sodium: 142 mEq/L (ref 135–145)

## 2013-06-04 MED ORDER — LISINOPRIL 10 MG PO TABS
40.0000 mg | ORAL_TABLET | Freq: Every day | ORAL | Status: DC
  Administered 2013-06-05: 40 mg via ORAL
  Filled 2013-06-04: qty 4

## 2013-06-04 MED ORDER — METOPROLOL TARTRATE 25 MG PO TABS
12.5000 mg | ORAL_TABLET | Freq: Two times a day (BID) | ORAL | Status: DC
  Administered 2013-06-04 – 2013-06-05 (×2): 12.5 mg via ORAL
  Filled 2013-06-04 (×2): qty 1

## 2013-06-04 NOTE — Progress Notes (Signed)
Pt stated that her heart felt like it was fluttering.  Stated that she has anxiety at times but has not had this feeling recently.  RN called Dr. Maudie Mercury and left message.

## 2013-06-04 NOTE — Progress Notes (Signed)
Dr. Maudie Mercury called and notified of pt's EKG results and updated BP.  No new orders at this time.  Continue to monitor.

## 2013-06-04 NOTE — Progress Notes (Signed)
PT Cancellation Note  Patient Details Name: [name] MRN: [id number] DOB: [birth year]   Cancelled Treatment:    Reason Eval/Treat Not Completed: PT screened, no needs identified, will sign off Pt states that she feels much better, has no difficulty walking.  She states that she has been walking in the room this morning with no problems.  I asked her to please let the nurse know if she changed her mind and felt that she needed an evaluation.  Sable Feil 06/04/2013, 9:33 AM

## 2013-06-04 NOTE — Progress Notes (Signed)
Subjective: Bradycardia:  Pt c/o palpitation earlier in the day, had EKG which showed sinus bradycardia.  Hyperkalemia: resolved.  Hypertension: uncontrolled, pt has been receiving iv hydralazine.  Denies cp, sob, n/v. Edema: improved greatly with iv lasix. PT evaluated the patient today.  Hyponatremia: on admission Na 130 resolved.  Dm2:  On metformin.  No low bs.  Anxiety: stable,   Objective: Vital signs in last 24 hours: Temp:  [97.9 F (36.6 C)-98.5 F (36.9 C)] 98.3 F (36.8 C) (08/21 1555) Pulse Rate:  [51-78] 78 (08/21 1555) Resp:  [18] 18 (08/21 1555) BP: (147-183)/(54-87) 147/85 mmHg (08/21 1555) SpO2:  [94 %-98 %] 98 % (08/21 1555)  Intake/Output from previous day: 08/20 0701 - 08/21 0700 In: 240 [P.O.:240] Out: 300 [Urine:300] Intake/Output this shift: Total I/O In: 720 [P.O.:720] Out: 850 [Urine:850]  Heent: anicteric Neck: no jvd, no bruit, no tm, Heart:  rrr s1, s2 Lung:  ctab Abd: soft, nt , +bs Ext: trace edema Skin:  No rash    Results for orders placed during the hospital encounter of 06/02/13 (from the past 24 hour(s))  SODIUM, URINE, RANDOM     Status: None   Collection Time    06/03/13  7:55 PM      Result Value Range   Sodium, Ur 54    OSMOLALITY, URINE     Status: Abnormal   Collection Time    06/03/13  7:55 PM      Result Value Range   Osmolality, Ur 190 (*) 390 - 1090 mOsm/kg  GLUCOSE, CAPILLARY     Status: Abnormal   Collection Time    06/03/13  8:51 PM      Result Value Range   Glucose-Capillary 140 (*) 70 - 99 mg/dL   Comment 1 Notify RN    BASIC METABOLIC PANEL     Status: Abnormal   Collection Time    06/04/13  5:46 AM      Result Value Range   Sodium 142  135 - 145 mEq/L   Potassium 4.8  3.5 - 5.1 mEq/L   Chloride 108  96 - 112 mEq/L   CO2 27  19 - 32 mEq/L   Glucose, Bld 96  70 - 99 mg/dL   BUN 17  6 - 23 mg/dL   Creatinine, Ser 1.20 (*) 0.50 - 1.10 mg/dL   Calcium 9.3  8.4 - 10.5 mg/dL   GFR calc non Af Amer 48 (*)  >90 mL/min   GFR calc Af Amer 55 (*) >90 mL/min  CBC     Status: Abnormal   Collection Time    06/04/13  5:46 AM      Result Value Range   WBC 5.1  4.0 - 10.5 K/uL   RBC 3.07 (*) 3.87 - 5.11 MIL/uL   Hemoglobin 9.3 (*) 12.0 - 15.0 g/dL   HCT 28.6 (*) 36.0 - 46.0 %   MCV 93.2  78.0 - 100.0 fL   MCH 30.3  26.0 - 34.0 pg   MCHC 32.5  30.0 - 36.0 g/dL   RDW 14.9  11.5 - 15.5 %   Platelets 191  150 - 400 K/uL  GLUCOSE, CAPILLARY     Status: None   Collection Time    06/04/13  7:32 AM      Result Value Range   Glucose-Capillary 86  70 - 99 mg/dL  GLUCOSE, CAPILLARY     Status: Abnormal   Collection Time    06/04/13 11:56 AM  Result Value Range   Glucose-Capillary 125 (*) 70 - 99 mg/dL  MRSA PCR SCREENING     Status: None   Collection Time    06/04/13  1:55 PM      Result Value Range   MRSA by PCR NEGATIVE  NEGATIVE  GLUCOSE, CAPILLARY     Status: Abnormal   Collection Time    06/04/13  4:50 PM      Result Value Range   Glucose-Capillary 127 (*) 70 - 99 mg/dL   Comment 1 Notify RN      Studies/Results: No results found.  Scheduled Meds: . ARIPiprazole  5 mg Oral Daily  . aspirin  81 mg Oral Daily  . benztropine  1 mg Oral QHS  . colesevelam  1,875 mg Oral BID WC  . DULoxetine  20 mg Oral BID  . enoxaparin (LOVENOX) injection  40 mg Subcutaneous Q24H  . furosemide  20 mg Intravenous Daily  . insulin aspart  0-5 Units Subcutaneous QHS  . insulin aspart  0-9 Units Subcutaneous TID WC  . iron polysaccharides  150 mg Oral Daily  . levothyroxine  400 mcg Oral QAC breakfast  . [START ON 06/05/2013] lisinopril  40 mg Oral Daily  . metFORMIN  500 mg Oral BID WC  . metoprolol  12.5 mg Oral BID  . pantoprazole  40 mg Oral Daily  . simvastatin  10 mg Oral q1800  . sodium chloride  3 mL Intravenous Q12H  . sodium polystyrene  15 g Oral Once  . traMADol  50 mg Oral Daily  . traZODone  100 mg Oral QHS   Continuous Infusions:  PRN Meds:sodium chloride, acetaminophen,  acetaminophen, clonazePAM, hydrALAZINE, sodium chloride  Assessment/Plan: Bradycardia: decrease metoprolol to 12.50m po bid Palpiation: resolved, ekg=> sinus brady with pac Hypertension: uncontrolled,  Increase lisniopril to 454mpoqday Edema: improved,  Will convert to po lasix tomorrow.  Hyponatremia: resolved Hyperkalemia resolved, will need to monitor closely due to fact that we are inceasing lisinopril.  Anxiety: stable Dm2:  Cont metofmrin.  Have be careful.  Due to mild renal insufficiency.  Anemia: will w/up as outpatient Dispo: tomorrow AM back to [facility],    LOS: 2 days   [name]

## 2013-06-04 NOTE — Progress Notes (Signed)
Clinical Social Work Department BRIEF PSYCHOSOCIAL ASSESSMENT 06/04/2013  Patient:  [name], [name]     Account Number:  [id number]     Admit date:  06/02/2013  Clinical Social Worker:  Daiva Huge  Date/Time:  06/04/2013 11:04 AM  Referred by:  Physician  Date Referred:  06/03/2013 Referred for  SNF Placement   Other Referral:   Interview type:  Patient Other interview type:    PSYCHOSOCIAL DATA Living Status:  FACILITY Admitted from facility:  [facility] Level of care:  [place] Primary support name:  husband Primary support relationship to patient:  FAMILY Degree of support available:   good    CURRENT CONCERNS Current Concerns  Post-Acute Placement   Other Concerns:    SOCIAL WORK ASSESSMENT / PLAN Patient admitted from [facility] where she reports she has been for about 2 weeks for rehab due to weakness. She plans to return there at d/c- she has [facility] as well as Medicaid pending per SNF rep.   Assessment/plan status:  Other - See comment Other assessment/ plan:   update FL2 and submit info to [employer]   Information/referral to community resources:   SNF  EMS    PATIENT'S/FAMILY'S RESPONSE TO PLAN OF CARE: Patient agrees to the plans for return to SNF bed at [facility] as pta. Will follow to assist and for support- patient very encouraged by the progress she has already made at the SNF.       Eduard Clos, MSW [phone]

## 2013-06-04 NOTE — Plan of Care (Signed)
Problem: Phase I Progression Outcomes Goal: Initial discharge plan identified Outcome: Completed/Met Date Met:  06/04/13 Return to [facility] at discharge.

## 2013-06-04 NOTE — Progress Notes (Signed)
UR Chart Review Completed  

## 2013-06-05 DIAGNOSIS — R001 Bradycardia, unspecified: Secondary | ICD-10-CM | POA: Diagnosis present

## 2013-06-05 DIAGNOSIS — E875 Hyperkalemia: Secondary | ICD-10-CM | POA: Diagnosis not present

## 2013-06-05 MED ORDER — CLONAZEPAM 0.5 MG PO TABS: ORAL_TABLET | ORAL | Status: DC

## 2013-06-05 MED ORDER — FUROSEMIDE 20 MG PO TABS: 20.0000 mg | ORAL_TABLET | Freq: Every day | ORAL | Status: DC

## 2013-06-05 MED ORDER — METOPROLOL TARTRATE 12.5 MG HALF TABLET: 12.5000 mg | ORAL_TABLET | Freq: Two times a day (BID) | ORAL | Status: DC

## 2013-06-05 NOTE — Progress Notes (Signed)
Called report to [place], LPN at [facility].  Verbalized understanding. Pt to be dc'd to facility when transportation arrives.  [place], Eulis Canner 06/05/2013

## 2013-06-05 NOTE — Care Management Note (Signed)
    Page 1 of 1   06/05/2013     11:39:15 AM   CARE MANAGEMENT NOTE 06/05/2013  Patient:  [name], [name]   Account Number:  [id number]  Date Initiated:  06/05/2013  Documentation initiated by:  Claretha Cooper  Subjective/Objective Assessment:   Pt lives at [facility]. Plans to return today.     Action/Plan:   Anticipated DC Date:  06/05/2013   Anticipated DC Plan:  [place]  CM consult      Choice offered to / List presented to:             Status of service:  Completed, signed off Medicare Important Message given?  YES (If response is "NO", the following Medicare IM given date fields will be blank) Date Medicare IM given:  06/05/2013 Date Additional Medicare IM given:    Discharge Disposition:  [place]  Per UR Regulation:    If discussed at Long Length of Stay Meetings, dates discussed:    Comments:  06/05/13 Claretha Cooper RN BSN CM

## 2013-06-05 NOTE — Clinical Social Work Note (Signed)
Patient ready for discharge today, will return to [facility], facility will transport w their staff as patient is unable to reach her husband.  FL2 reviewed w RN and updated as needed.  Discharge summary faxed to facility.  Discharge packet prepared and placed w shadow chart for transport.  Patient and facility both agreeable to discharge plan.  Facility asked to address PASARR issues, admissions states they will complete today.  SW signing off as no further SW needs identified.  Edwyna Shell, LCSW Clinical Social Worker [phone])

## 2013-06-05 NOTE — Evaluation (Signed)
Physical Therapy Evaluation Patient Details Name: [name] MRN: [id number] DOB: 10/26/1950 Today's Date: 06/05/2013 Time: 6283-1517 PT Time Calculation (min): 21 min  PT Assessment / Plan / Recommendation History of Present Illness  Pt had been at [facility] for rehab prior to admission. She has been admitted for acute onset of edema and was noted to have unsteady gait.  Pt's right hand has been amputated due to trauma, but she is left dominant.  She now reports that she feels "fine", but we are asked  by the insurer for a PT consult.   Clinical Impression   Pt was seen for evaluation and found to have no significant issues with mobility.  I have recommended that she not wear thongs during gait and that she have her right AFO be repaired ASAP so that is can be worn for all gait.    PT Assessment  Patent does not need any further PT services    Follow Up Recommendations  No PT follow up    Does the patient have the potential to tolerate intense rehabilitation      Barriers to Discharge        Equipment Recommendations  None recommended by PT    Recommendations for Other Services     Frequency      Precautions / Restrictions Precautions Precautions: None Restrictions Weight Bearing Restrictions: No   Pertinent Vitals/Pain       Mobility  Bed Mobility Bed Mobility: Supine to Sit Supine to Sit: 7: Independent Transfers Transfers: Sit to Stand;Stand to Sit Sit to Stand: 7: Independent;From bed;With upper extremity assist Stand to Sit: 7: Independent;To bed;Without upper extremity assist Ambulation/Gait Ambulation/Gait Assistance: 7: Independent Ambulation Distance (Feet): 300 Feet Assistive device: None Gait Pattern: Right foot flat;Within Functional Limits Gait velocity: WNL General Gait Details: pt actually has thongs on feet and yet she is able to ambulate with no gait instability and a right drop foot Stairs: No Wheelchair Mobility Wheelchair Mobility: No    Exercises     PT Diagnosis:    PT Problem List:   PT Treatment Interventions:       PT Goals(Current goals can be found in the care plan section) Acute Rehab PT Goals PT Goal Formulation: No goals set, d/c therapy  Visit Information  Last PT Received On: 06/05/13 History of Present Illness: Pt had been at [facility] for rehab prior to admission. She has been admitted for acute onset of edema and was noted to have unsteady gait.  Pt's right hand has been amputated due to trauma, but she is left dominant.  She now reports that she feels "fine", but we are asked  by the insurer for a PT consult.        Prior [place] expects to be discharged to:: Skilled nursing facility Prior Function Level of Independence: Independent Communication Communication: No difficulties    Cognition  Cognition Arousal/Alertness: Awake/alert Behavior During Therapy: WFL for tasks assessed/performed Overall Cognitive Status: Within Functional Limits for tasks assessed    Extremity/Trunk Assessment Lower Extremity Assessment Lower Extremity Assessment: Overall WFL for tasks assessed;RLE deficits/detail RLE Deficits / Details: pt has a drop foot with anterior tibialis strength 2/5...she has an AFO but it currently needs to be modified due to a poor fit   Balance Balance Balance Assessed: Yes Dynamic Standing Balance Dynamic Standing - Balance Support: No upper extremity supported Dynamic Standing - Level of Assistance: 6: Modified independent (Device/Increase time)  End of Session PT -  End of Session Equipment Utilized During Treatment: Gait belt Activity Tolerance: Patient tolerated treatment well Patient left: in bed Nurse Communication: Mobility status  GP     Sable Feil 06/05/2013, 10:57 AM

## 2013-06-05 NOTE — Discharge Summary (Signed)
Physician Discharge Summary  Patient ID: AVERLEE SWARTZ MRN: [id number] DOB/AGE: March 24, 1951 62 y.o.  Admit date: 06/02/2013 Discharge date: 06/05/2013  Admission Diagnoses: Edema Hyponatremia Amputation R hand Gait instability Anemia Dm2 Hypertension  Copd  Discharge Diagnoses:  Active Problems:   Edema   Hyponatremia   Hyperkalemia   Bradycardia  Palpitation Anemia Gait instability Dm2 Hypertension uncontrolled (received iv hydralazine)  Discharged Condition: stable  Hospital Course: 62 yo female with hx of Copd, hypertension, R hand ampuation, gait instability, chronic lower ext edema had refractory edema(pitting edema to the knees bilaterally) to oral lasix.  Pt was noted to be hyponatremic Na 130, and admitted for diuresis with iv lasix due to fact that she would need her sodium to be closely monitored.  Pt then developed hyperkalemia, and c/o palpiations,  ekg showed sinus bradycardia with pac.  Pt's edema improved with iv diuresis,  And hyperkalemia resolved. Pt developed uncontrolled hypertension requiring iv hydralazine.  We increase lisinopril with improvement in bp and had to adjust her metoprolol from 49m to 12.532mpo bid.   Pt will need training for TED hose by PT,  This may require time since pt has right hand amputation, and also has still has some gait instability.     Consults: None  Significant Diagnostic Studies: labs:   Treatments: iv lasix,   Discharge Exam: Blood pressure 155/83, pulse 64, temperature 98.7 F (37.1 C), temperature source Oral, resp. rate 20, height 5' 6"  (1.676 m), weight 171 lb 8.3 oz (77.8 kg), SpO2 98.00%. Heent: anicteric Neck: no jvd, no bruit, no tm,  Haert: rrr s1, s2, no m/g/r Lung: ctab ABd: soft, nt, nd, +bs Ext: no c/c/,  Trace edema,  Gait: unstable,   LYph: no cervical adenopathy   A/P: Edema: restart on lasix 2027moqday, check bmp in 5 days Hyponatremia: recheck bmp in 5 days Hyperkalemia: recheck bmp in 5  days since we increased lisinopril.  Hypertension: uncontrolled,  Pt needs qshift vs, call if sbp >160 for adjustment of medications.  Palpitations: probably due to pac,  Continue metoprolol Bradycardia, resolved,  After decrease in metoprolol. Gait instability: fall risk needs PT/oT to evaluate and tx,   Dispo: needs PT to help with putting on TED hose for edema.  Back to SNF today Disposition: 03-Skilled Nursing Facility     Medication List    STOP taking these medications       esomeprazole 40 MG capsule  Commonly known as:  NEX[place] medications       ARIPiprazole 5 MG tablet  Commonly known as:  ABILIFY  Take 1 tablet (5 mg total) by mouth daily.     aspirin 81 MG chewable tablet  Chew 81 mg by mouth daily.     benztropine 1 MG tablet  Commonly known as:  COGENTIN  Take 1 mg by mouth at bedtime.     clonazePAM 0.5 MG tablet  Commonly known as:  KLONOPIN  take1 tab bid     colesevelam 625 MG tablet  Commonly known as:  WELCHOL  Take 625 mg by mouth daily.     DULoxetine 20 MG capsule  Commonly known as:  CYMBALTA  Take 40 mg by mouth 2 (two) times daily.     ferrous sulfate 325 (65 FE) MG tablet  Take 325 mg by mouth 2 (two) times daily.     fish oil-omega-3 fatty acids 1000 MG capsule  Take 2 g by mouth daily.  furosemide 20 MG tablet  Commonly known as:  LASIX  Take 1 tablet (20 mg total) by mouth daily.     levothyroxine 200 MCG tablet  Commonly known as:  SYNTHROID, LEVOTHROID  Take 400 mcg by mouth daily.     lisinopril 20 MG tablet  Commonly known as:  PRINIVIL,ZESTRIL  Take 20 mg by mouth daily.     metFORMIN 500 MG tablet  Commonly known as:  GLUCOPHAGE  Take 500 mg by mouth 2 (two) times daily.     metoprolol tartrate 12.5 mg Tabs tablet  Commonly known as:  LOPRESSOR  Take 0.5 tablets (12.5 mg total) by mouth 2 (two) times daily.     naproxen 500 MG tablet  Commonly known as:  NAPROSYN  Take 500 mg by mouth 2 (two)  times daily with a meal.     niacin 1000 MG CR tablet  Commonly known as:  NIASPAN  Take 1,000 mg by mouth at bedtime.     nitroGLYCERIN 0.4 MG SL tablet  Commonly known as:  NITROSTAT  Place 0.4 mg under the tongue every 5 (five) minutes as needed. Chest pain     pravastatin 40 MG tablet  Commonly known as:  PRAVACHOL  Take 40 mg by mouth daily.     traMADol 50 MG tablet  Commonly known as:  ULTRAM  Take 50 mg by mouth 3 (three) times daily.     traZODone 100 MG tablet  Commonly known as:  DESYREL  Take 1 tablet (100 mg total) by mouth at bedtime.     vitamin C 500 MG tablet  Commonly known as:  ASCORBIC ACID  Take 500 mg by mouth 2 (two) times daily.     Vitamin D-3 1000 UNITS Caps  Take 1 capsule by mouth daily.           Follow-up Information   Please follow up. (f/u with nursing home physician in 3-5 days)       Signed: Jani Gravel 06/05/2013, 11:36 AM

## 2013-06-25 ENCOUNTER — Ambulatory Visit (HOSPITAL_COMMUNITY): Payer: Self-pay | Admitting: Psychiatry

## 2013-08-24 ENCOUNTER — Encounter (HOSPITAL_COMMUNITY): Payer: Self-pay | Admitting: Psychiatry

## 2013-08-24 ENCOUNTER — Ambulatory Visit (INDEPENDENT_AMBULATORY_CARE_PROVIDER_SITE_OTHER): Payer: Medicare Other | Admitting: Psychiatry

## 2013-08-24 VITALS — BP 140/70 | Ht 66.0 in | Wt 156.0 lb

## 2013-08-24 DIAGNOSIS — F319 Bipolar disorder, unspecified: Secondary | ICD-10-CM

## 2013-08-24 DIAGNOSIS — F39 Unspecified mood [affective] disorder: Secondary | ICD-10-CM

## 2013-08-24 DIAGNOSIS — F329 Major depressive disorder, single episode, unspecified: Secondary | ICD-10-CM

## 2013-08-24 DIAGNOSIS — F3289 Other specified depressive episodes: Secondary | ICD-10-CM

## 2013-08-24 MED ORDER — ARIPIPRAZOLE 5 MG PO TABS
5.0000 mg | ORAL_TABLET | Freq: Every day | ORAL | Status: DC
Start: 2013-08-24 — End: 2013-12-03

## 2013-08-24 MED ORDER — BENZTROPINE MESYLATE 1 MG PO TABS: 1.0000 mg | ORAL_TABLET | Freq: Every day | ORAL | Status: DC

## 2013-08-24 MED ORDER — TRAZODONE HCL 100 MG PO TABS
100.0000 mg | ORAL_TABLET | Freq: Every day | ORAL | Status: DC
Start: 2013-08-24 — End: 2013-12-03

## 2013-08-24 MED ORDER — CLONAZEPAM 0.5 MG PO TABS: ORAL_TABLET | ORAL | Status: DC

## 2013-08-24 MED ORDER — DULOXETINE HCL 20 MG PO CPEP: 40.0000 mg | ORAL_CAPSULE | Freq: Two times a day (BID) | ORAL | Status: DC
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
## 2013-09-10 IMAGING — CT CT ABD-PELV W/O CM
2 of 3 series · 17 of 46 positions shown, 19 images · non-contrast
Comparison: 07/09/2007 CT.

CLINICAL DATA: 61-year-old female with right abdominal, flank and
pelvic pain.

CT ABDOMEN AND PELVIS WITHOUT CONTRAST
TECHNIQUE: Multidetector CT imaging of the abdomen and pelvis was
performed following the standard protocol without intravenous
contrast.

[Series 2: standard/full over (age)lbs 5.0 · axial · 0.77mm/px · z∈[-460,-50]mm · 14 of 96 slices shown, 16 images]
[im 7/96  soft-tissue]
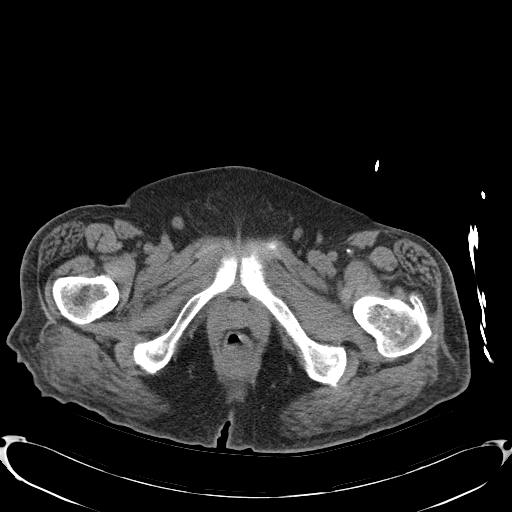
[im 7/96  bone]
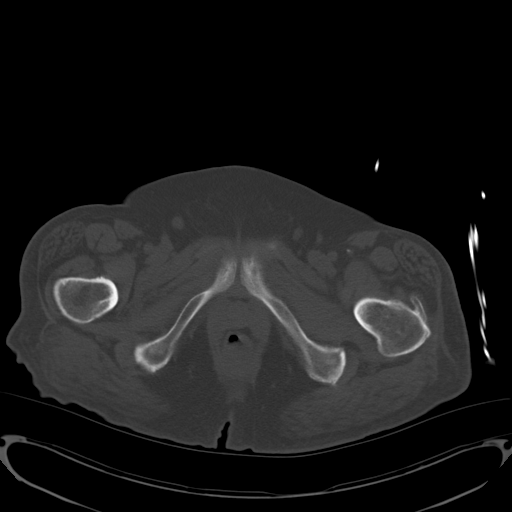
[im 13/96  soft-tissue]
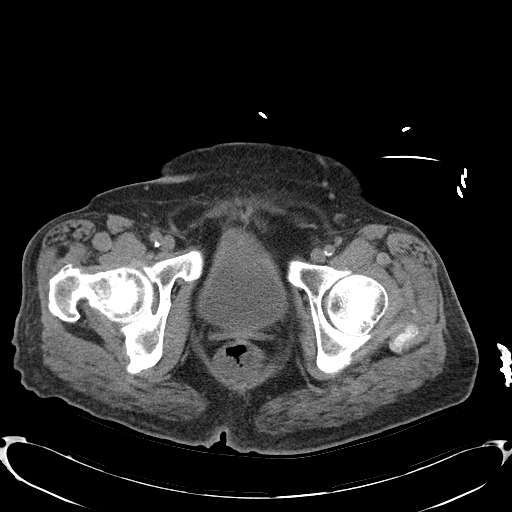
[im 19/96  soft-tissue]
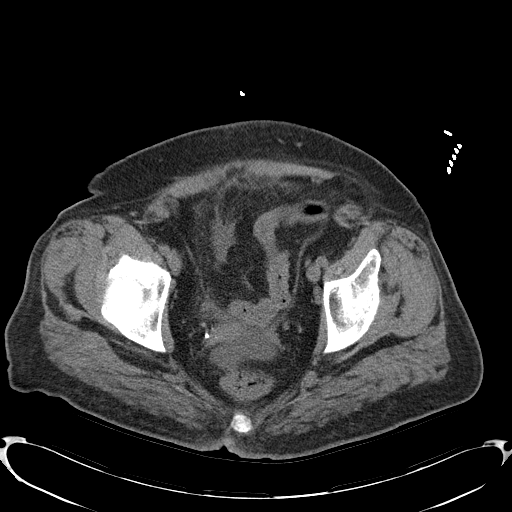
[im 25/96  soft-tissue]
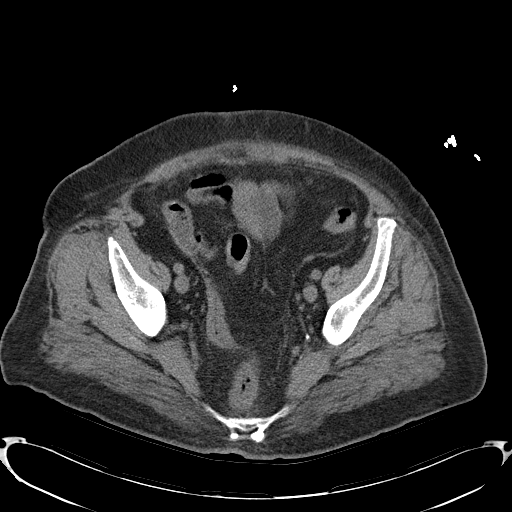
[im 31/96  soft-tissue]
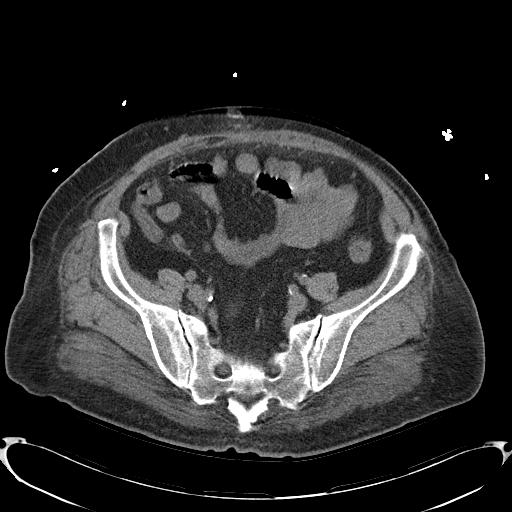
[im 37/96  soft-tissue]
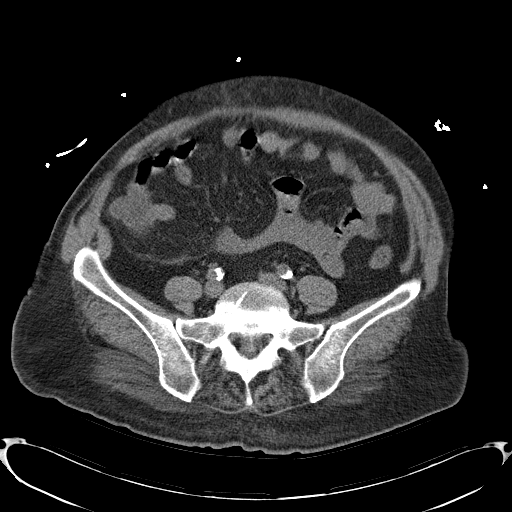
[im 43/96  soft-tissue]
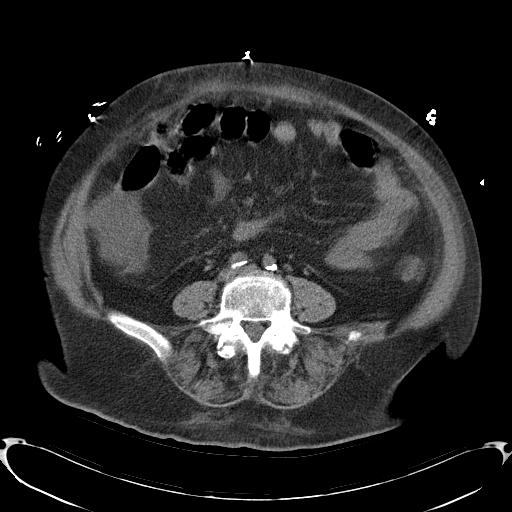
[im 53/96  soft-tissue]
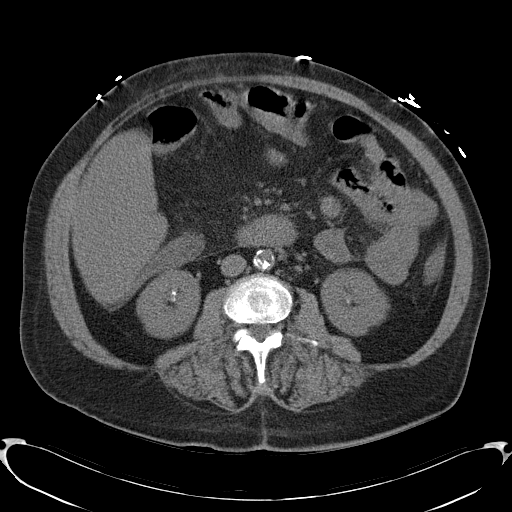
[im 59/96  soft-tissue]
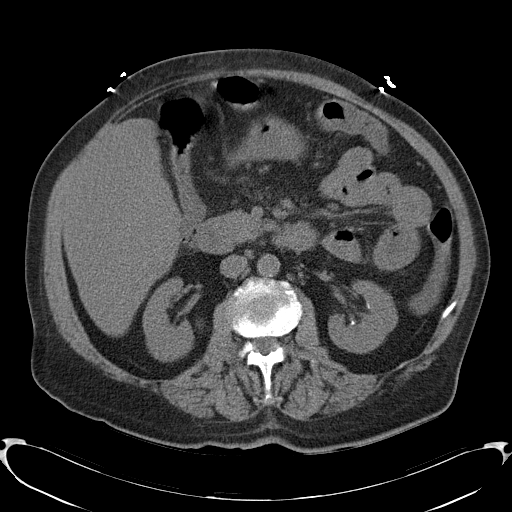
[im 59/96  bone]
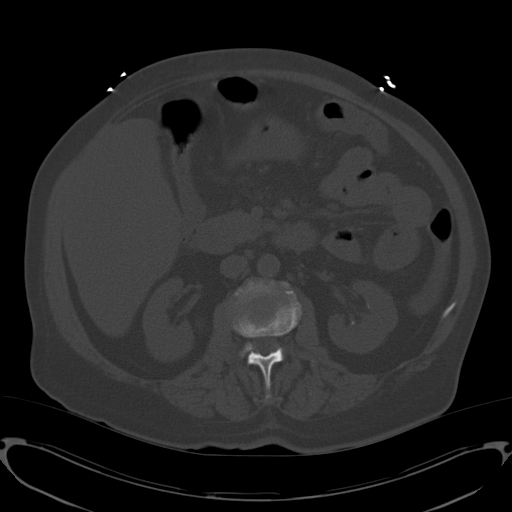
[im 65/96  soft-tissue]
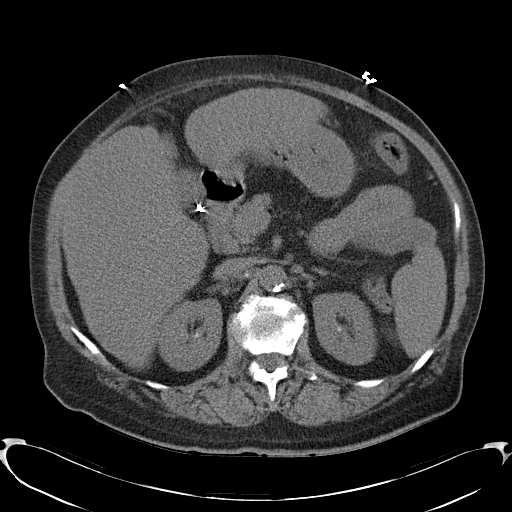
[im 71/96  soft-tissue]
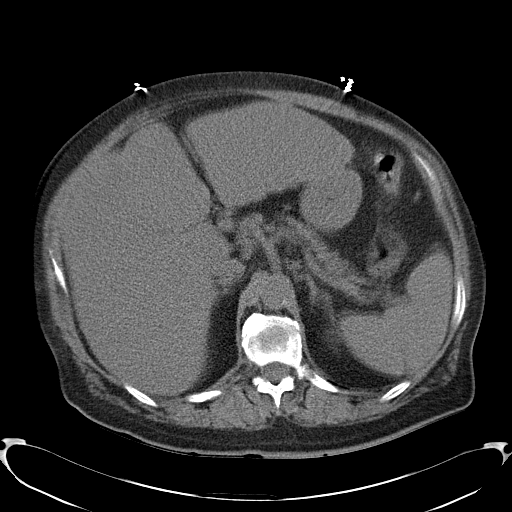
[im 77/96  soft-tissue]
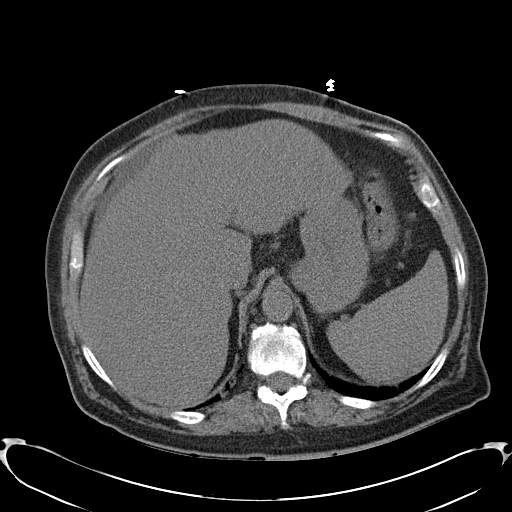
[im 83/96  soft-tissue]
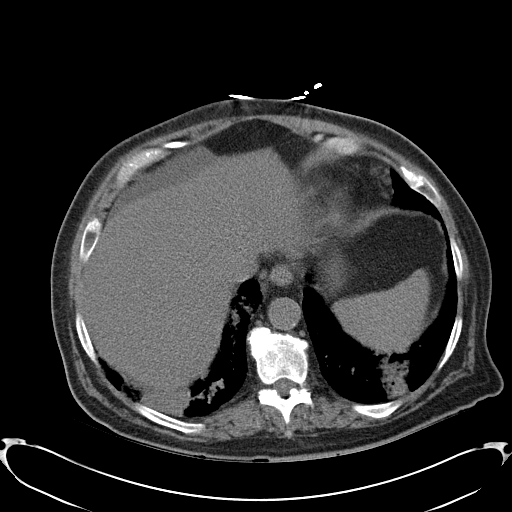
[im 89/96  soft-tissue]
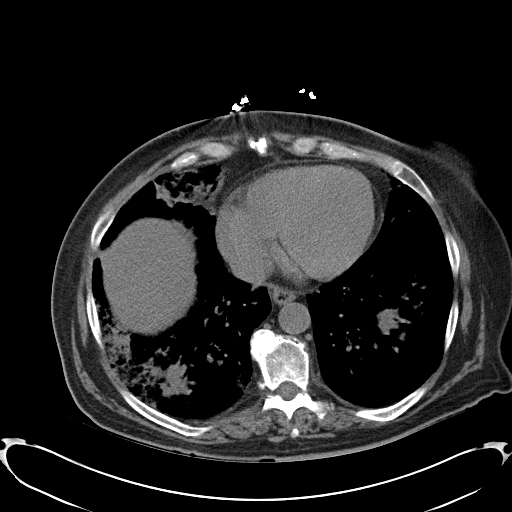

[Series 4: mpr coronal · coronal · 0.82mm/px · 3 of 102 slices shown]
[im 34/102  soft-tissue]
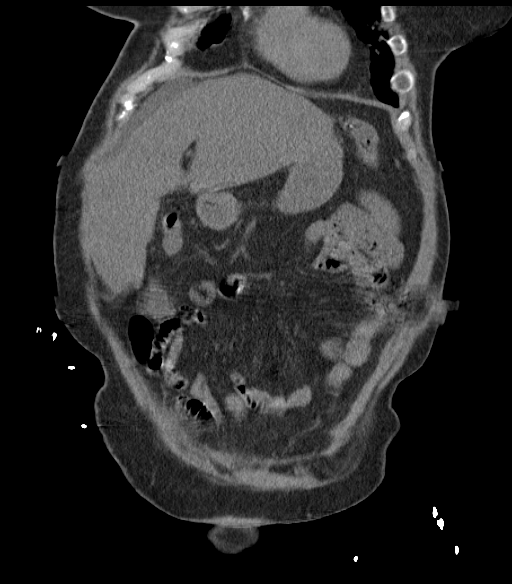
[im 45/102  soft-tissue]
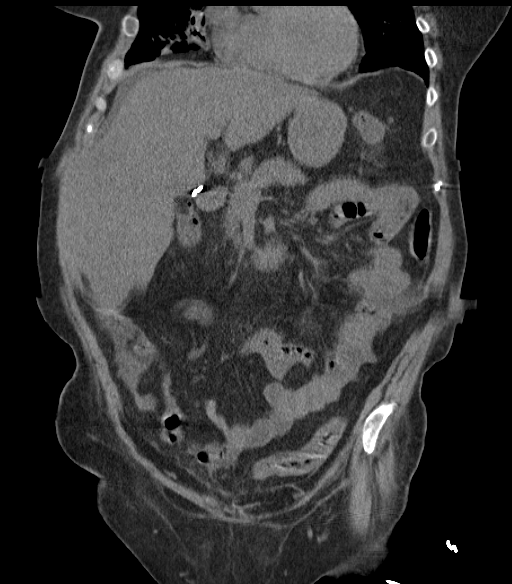
[im 57/102  soft-tissue]
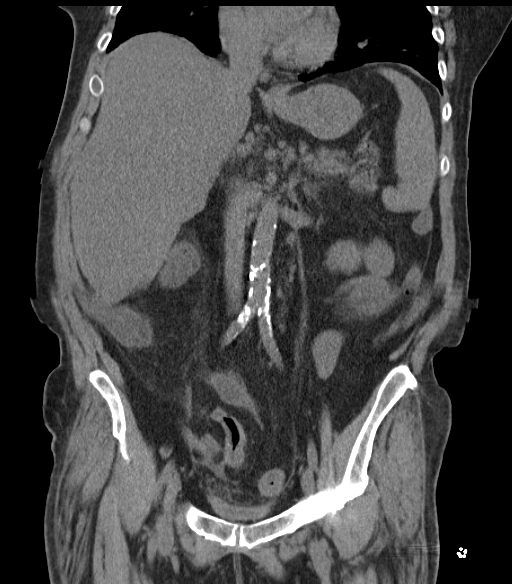

[17 of 46 positions shown; findings below may reference images not displayed]

FINDINGS: Scattered areas of focal consolidation and airspace
disease within both lung bases are identified - likely representing
pneumonia.  Follow-up is recommended.

Hepatomegaly is again identified without evidence of focal hepatic
abnormality.
The spleen, adrenal glands, and pancreas are unremarkable.
Multiple nonobstructing bilateral renal calculi are identified.
There is no evidence of hydronephrosis or ureteral calculi.

Please note that parenchymal abnormalities may be missed as
intravenous contrast was not administered.

A small amount of ascites is identified adjacent to the liver and
within the pelvis.

No enlarged lymph nodes, biliary dilation or abdominal aortic
aneurysm identified.
Moderate to heavy aortic and iliac atherosclerotic calcifications
again noted. The patient is status post cholecystectomy and
hysterectomy.

The bowel and bladder are within normal limits.

Mild subcutaneous stranding/edema is noted. No acute or suspicious
bony abnormalities are identified.
IMPRESSION: Consolidation and airspace disease within both lower lungs likely
representing pneumonia.  Radiographic follow up to resolution
recommended.

Tiny amount of abdominal pelvic ascites - nonspecific.

Mild subcutaneous edema.

Hepatomegaly and nonobstructing bilateral renal calculi.

## 2013-09-10 IMAGING — CR DG CHEST 1V PORT
1 series · 1 of 1 positions shown · non-contrast
Comparison: Single view of the chest 07/06/2012 and PA and lateral
chest 08/19/2010.

CLINICAL DATA: Shortness of breath.

PORTABLE CHEST - 1 VIEW

[view not recorded]
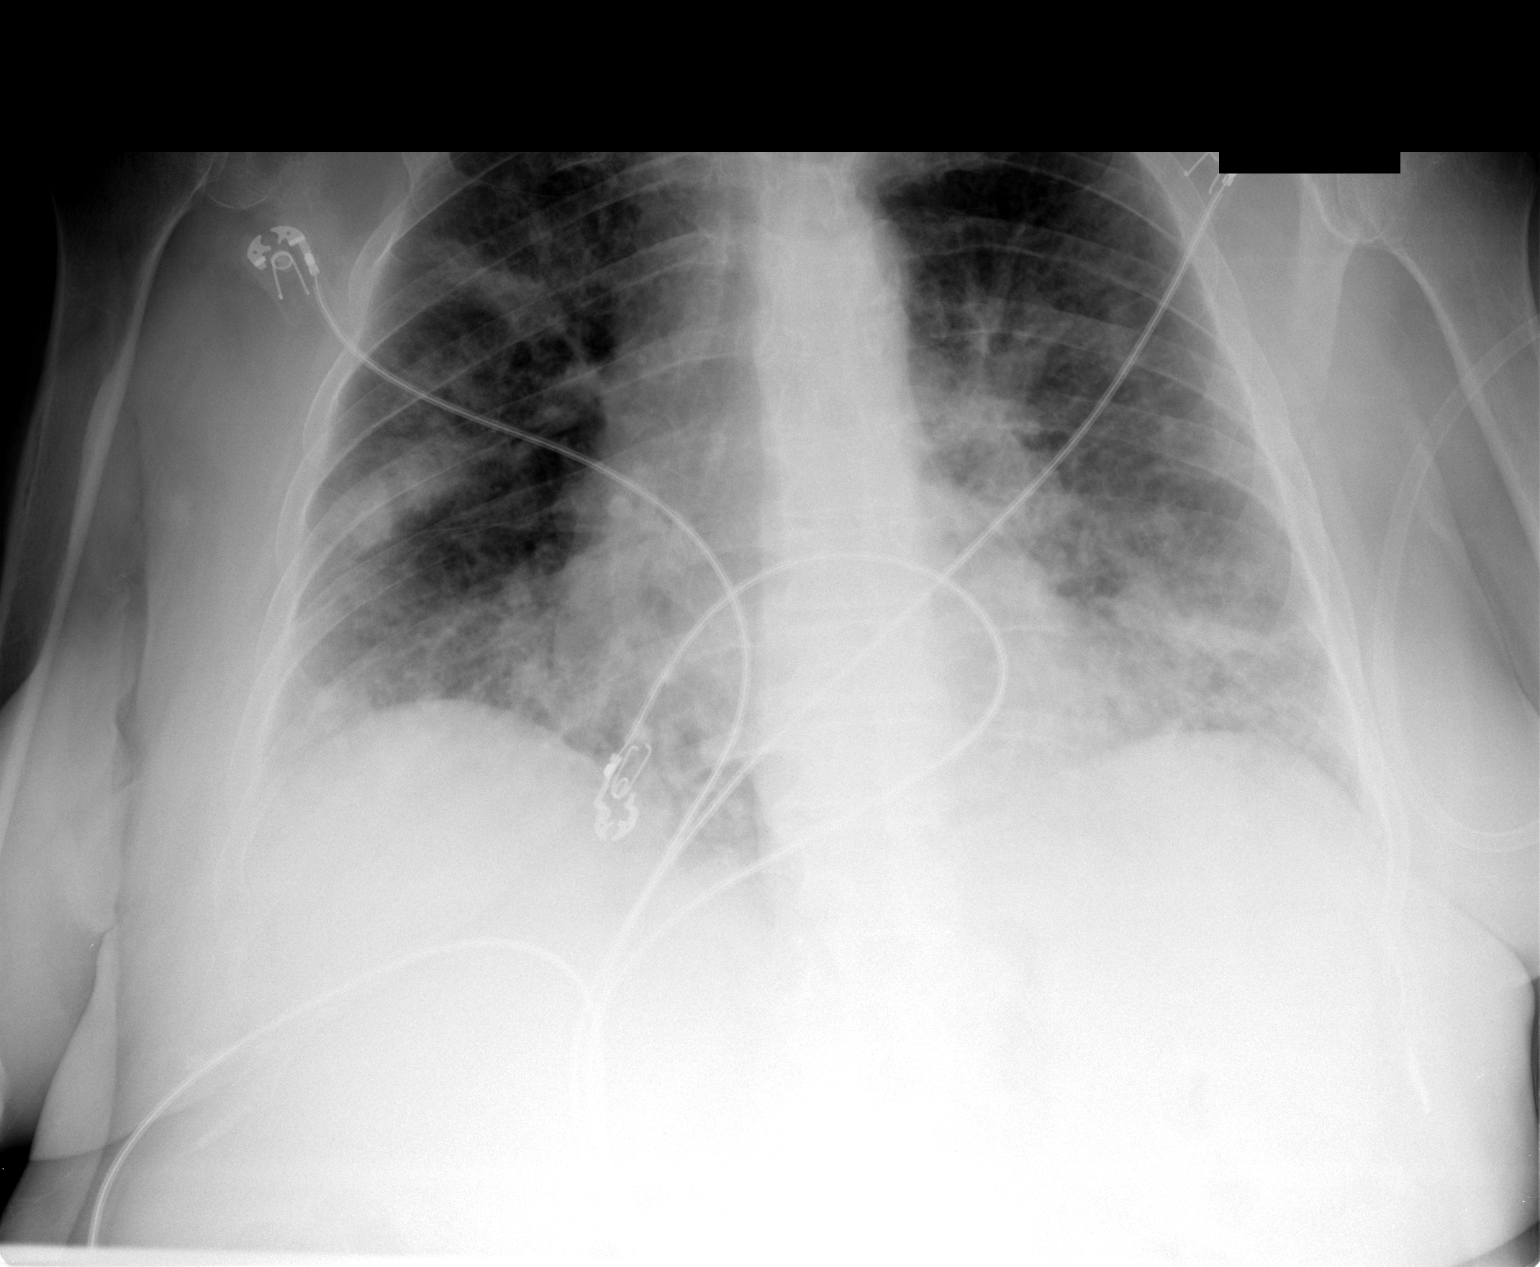

[1 of 1 positions shown; findings below may reference images not displayed]

FINDINGS: There is extensive patchy bilateral airspace disease.
Heart size is upper normal.  No pneumothorax or pleural effusion.
IMPRESSION: Extensive bilateral airspace disease is most likely due to multi
focal pneumonia rather than asymmetric edema.

## 2013-09-11 IMAGING — CT CT HEAD W/O CM
2 of 3 series · 16 of 30 positions shown, 19 images · non-contrast
Comparison: 02/10/2008

CLINICAL DATA: Altered mental status and agitation.  History of
diabetes, vascular disease, DVT, and multiple surgeries.

CT HEAD WITHOUT CONTRAST
TECHNIQUE: Contiguous axial images were obtained from the base of
the skull through the vertex without contrast.

[Series 2: headseq 4.8 h37s · axial · 0.43mm/px · z∈[+76,+206]mm · 12 of 33 slices shown, 15 images]
[im 3/33  brain]
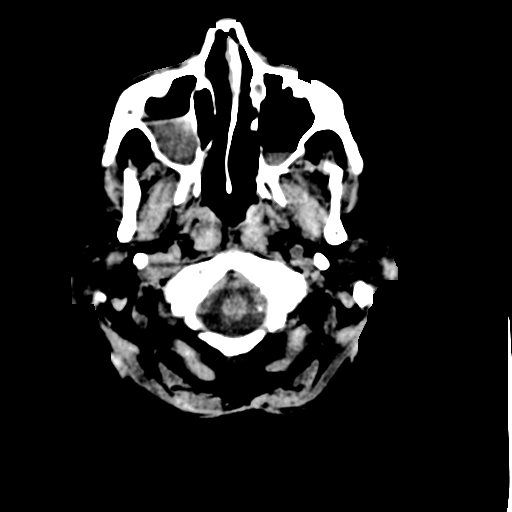
[im 3/33  bone]
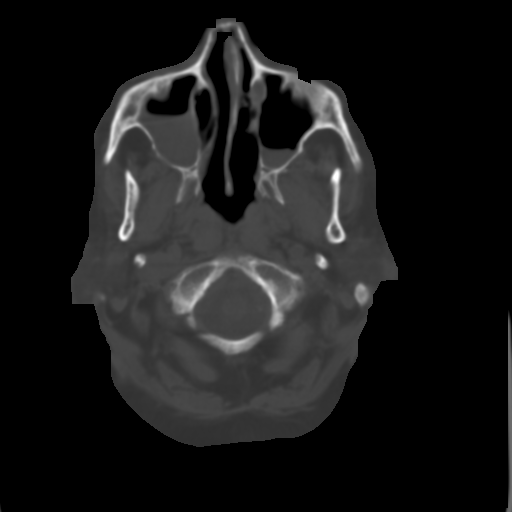
[im 5/33  brain]
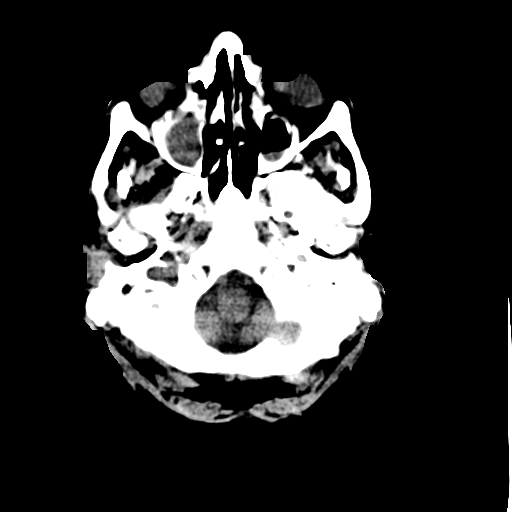
[im 8/33  brain]
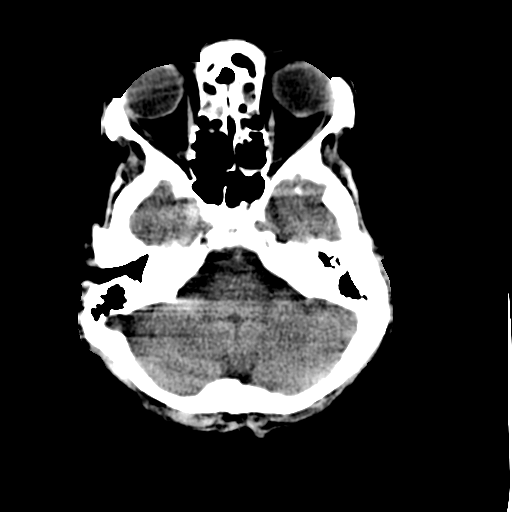
[im 10/33  brain]
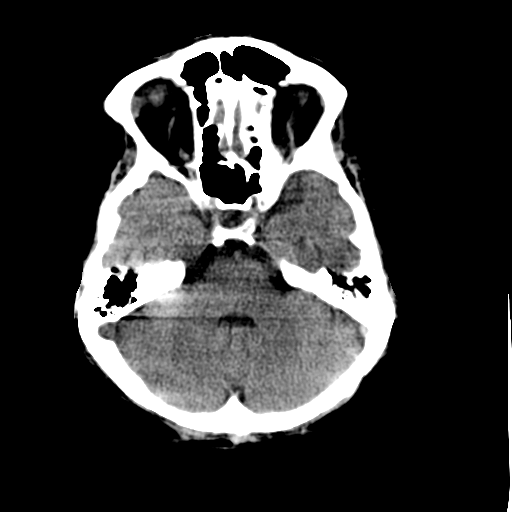
[im 13/33  brain]
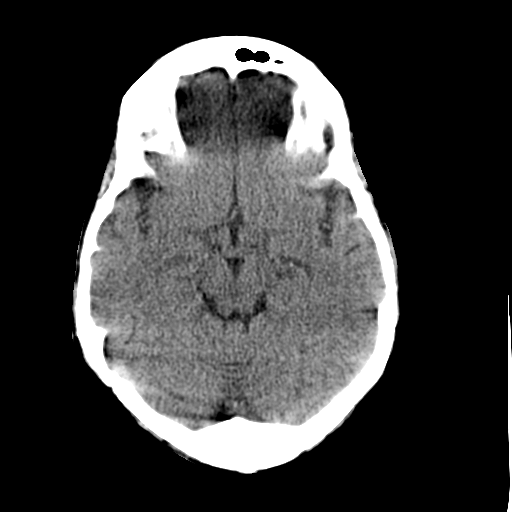
[im 13/33  bone]
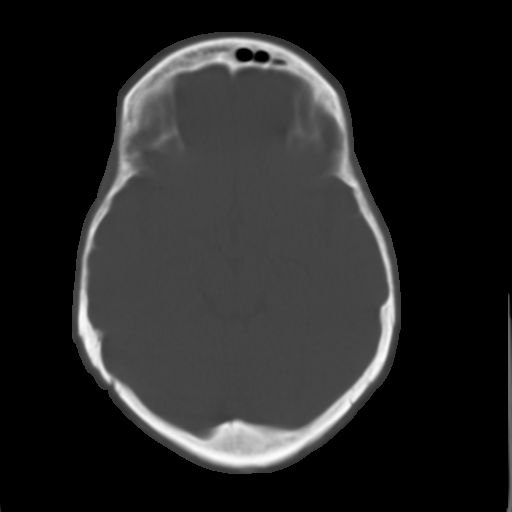
[im 15/33  brain]
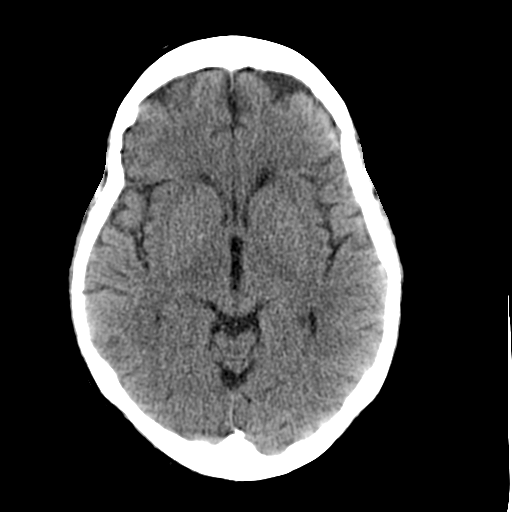
[im 18/33  brain]
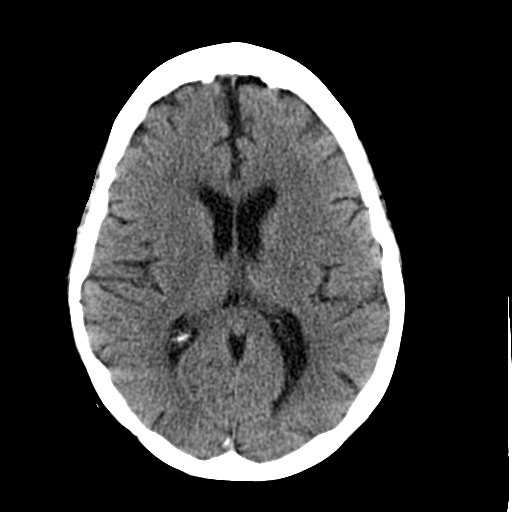
[im 20/33  brain]
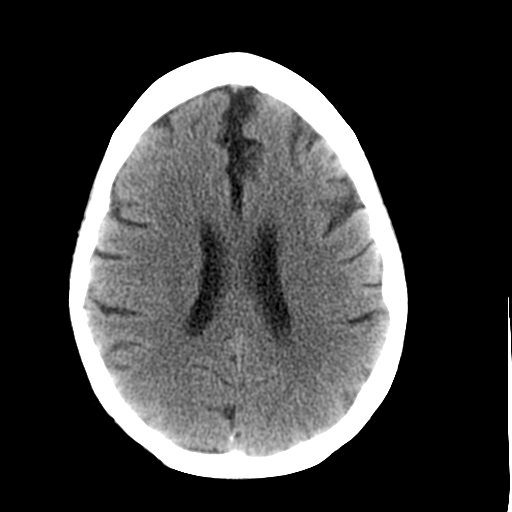
[im 23/33  brain]
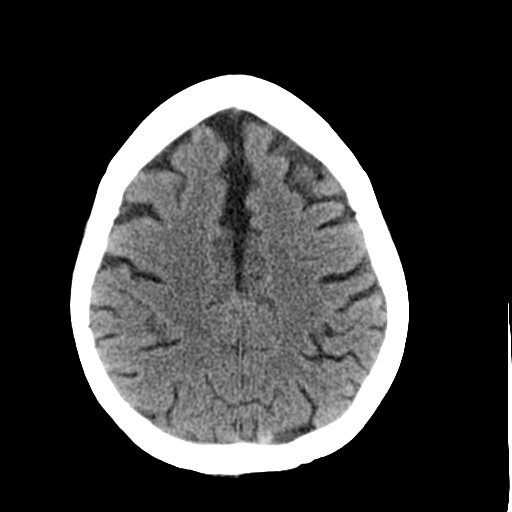
[im 23/33  bone]
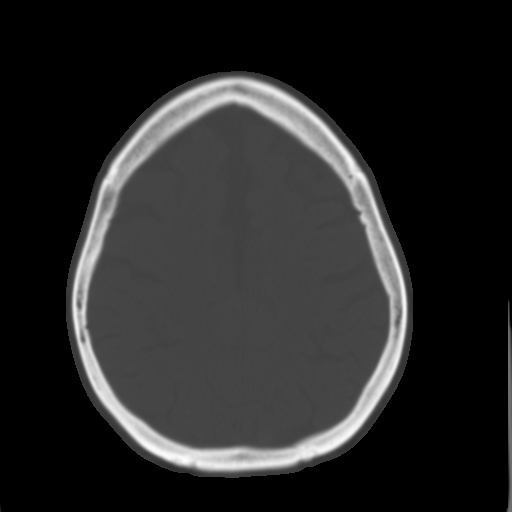
[im 25/33  brain]
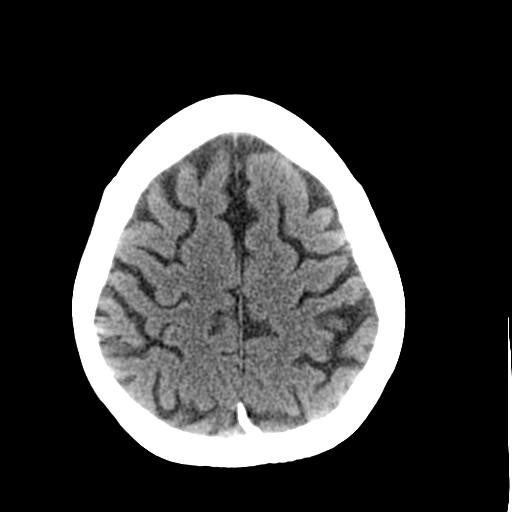
[im 28/33  brain]
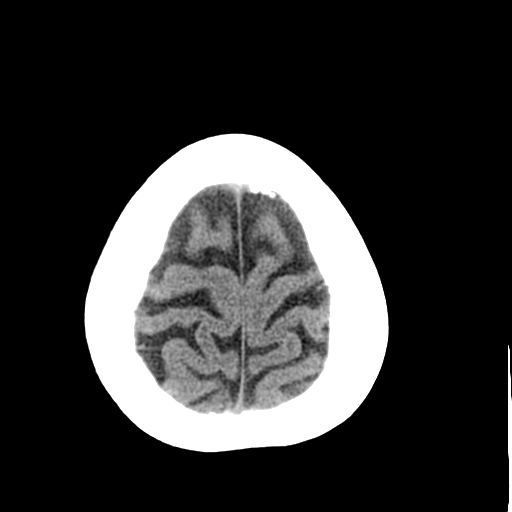
[im 30/33  brain]
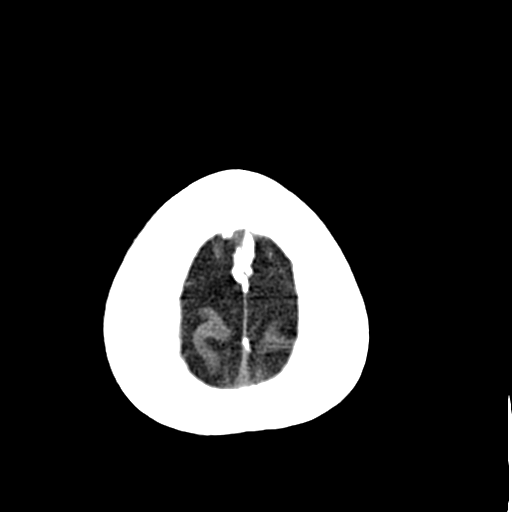

[Series 4: headseq 4.8 h60s · axial · 0.43mm/px · z∈[+76,+143]mm · 4 of 36 slices shown]
[im 3/36  brain]
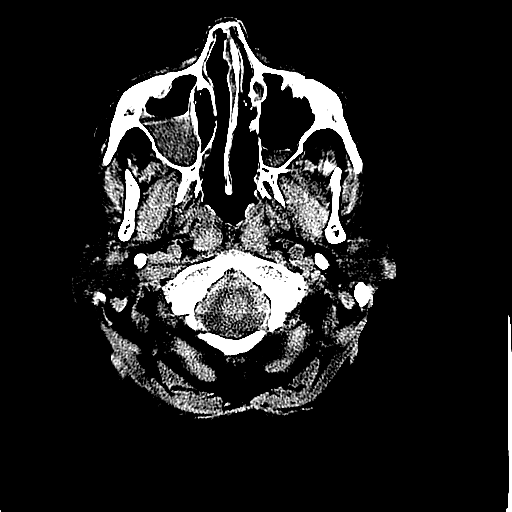
[im 9/36  brain]
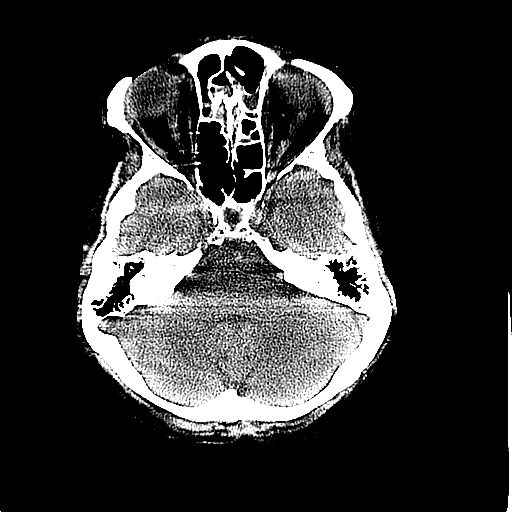
[im 11/36  brain]
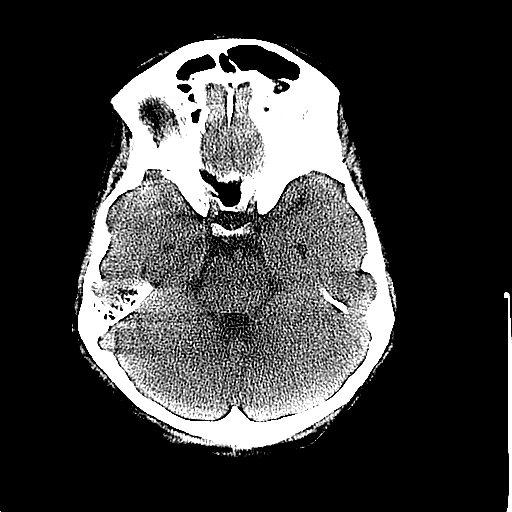
[im 17/36  brain]
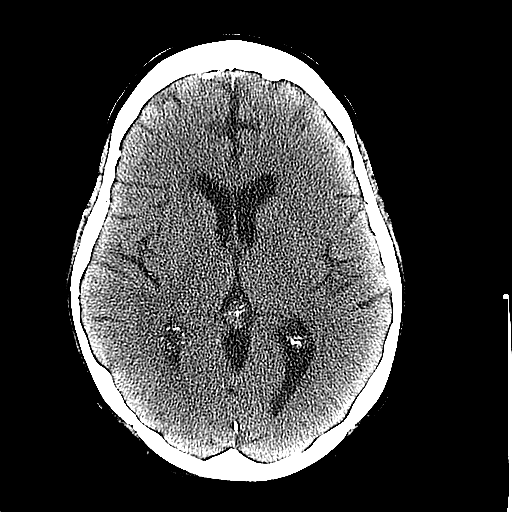

[16 of 30 positions shown; findings below may reference images not displayed]

FINDINGS: Study is somewhat technically limited due to motion
artifact.

Mild cerebral atrophy.  No ventricular dilatation.  No mass effect
or midline shift.  No abnormal extra-axial fluid collections.  Gray-
white matter junctions are distinct.  Basal cisterns are not
effaced.  No evidence of acute intracranial hemorrhage.
Intracranial contents are not significantly changed since previous
study.

No depressed skull fractures.  There is opacification of the
ethmoid air cells bilaterally with small amount of fluid or
membrane thickening in the sphenoid sinuses.  Air-fluid levels are
present in both maxillary antra.  Changes suggest possible
sinusitis.  Mastoid air cells are not opacified.
IMPRESSION: No acute intracranial abnormalities.  Presumed inflammatory changes
in the paranasal sinuses with air-fluid levels in the maxillary
antra suggesting sinusitis.

## 2013-09-14 ENCOUNTER — Ambulatory Visit (HOSPITAL_COMMUNITY): Payer: Self-pay | Admitting: Psychiatry

## 2013-09-14 IMAGING — CR DG CHEST 2V
2 series · 2 of 2 positions shown · non-contrast
Comparison: 12/26/2012.

CLINICAL DATA: Increased shortness of breath.  Confusion.

CHEST - 2 VIEW

[view not recorded (1 of 2)]
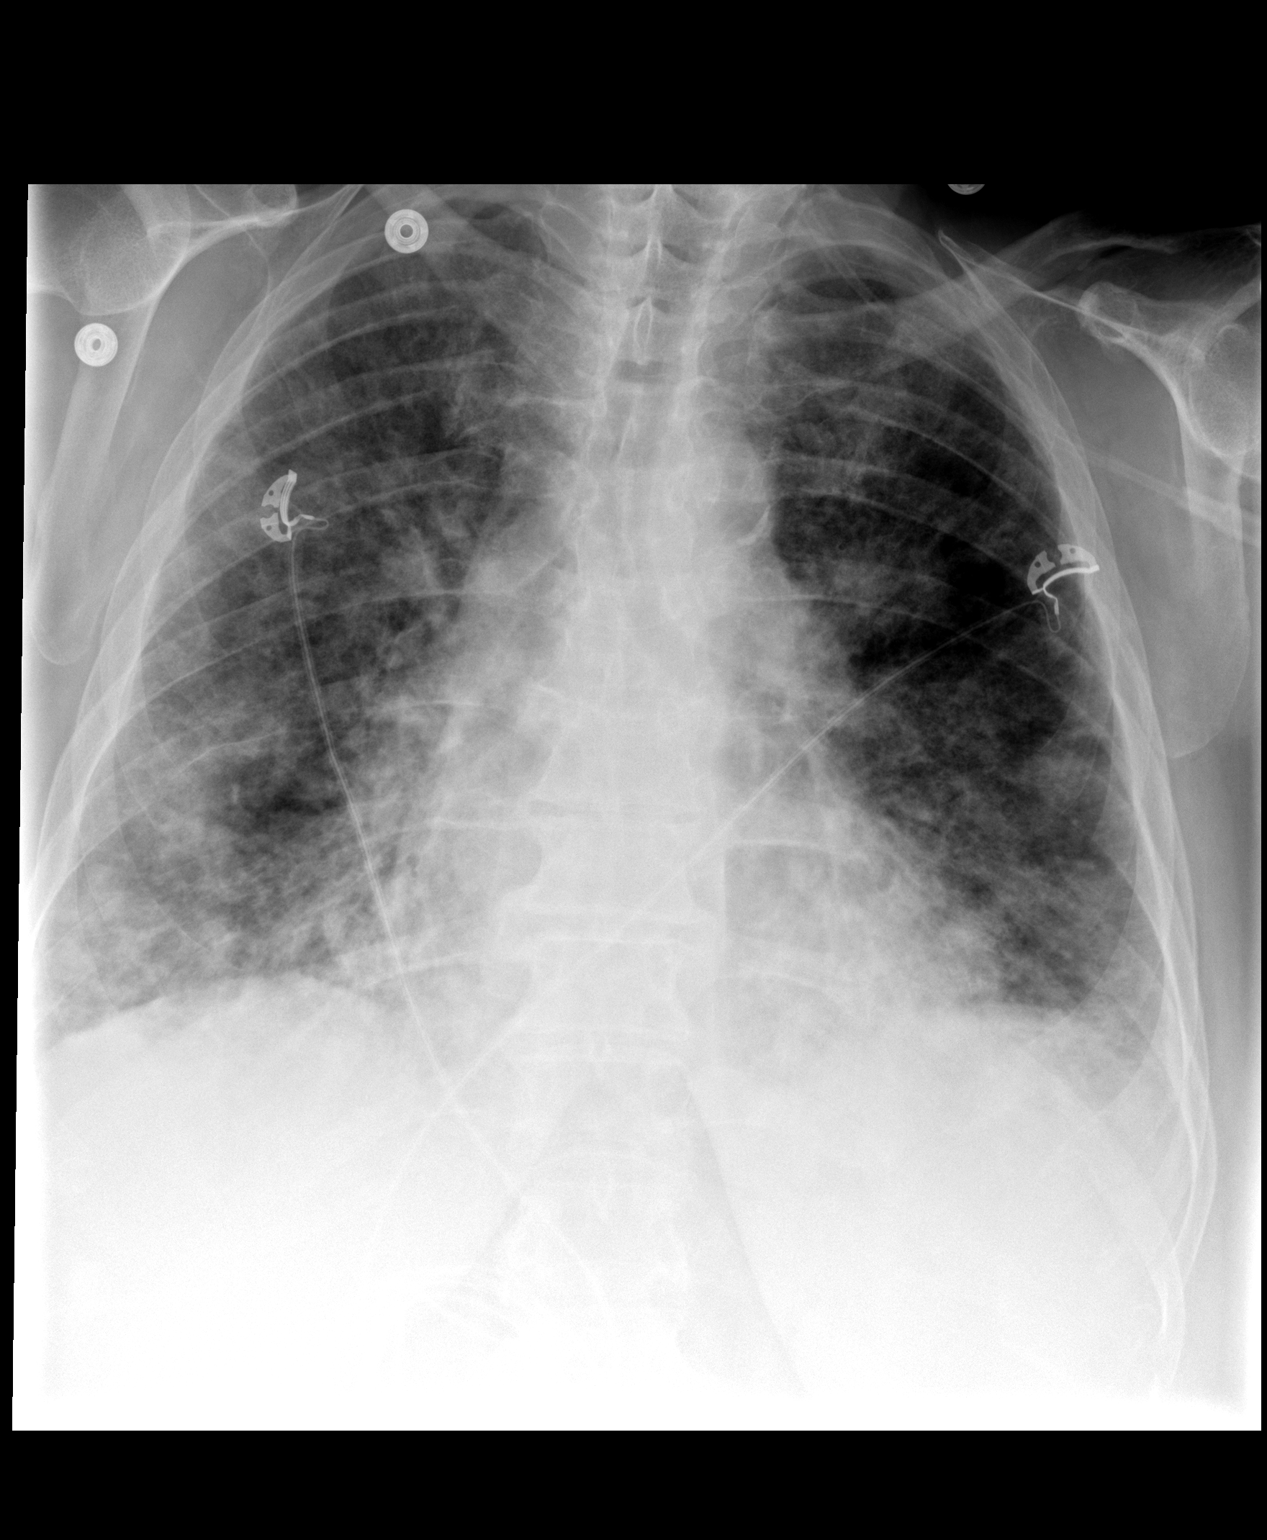

[view not recorded (2 of 2)]
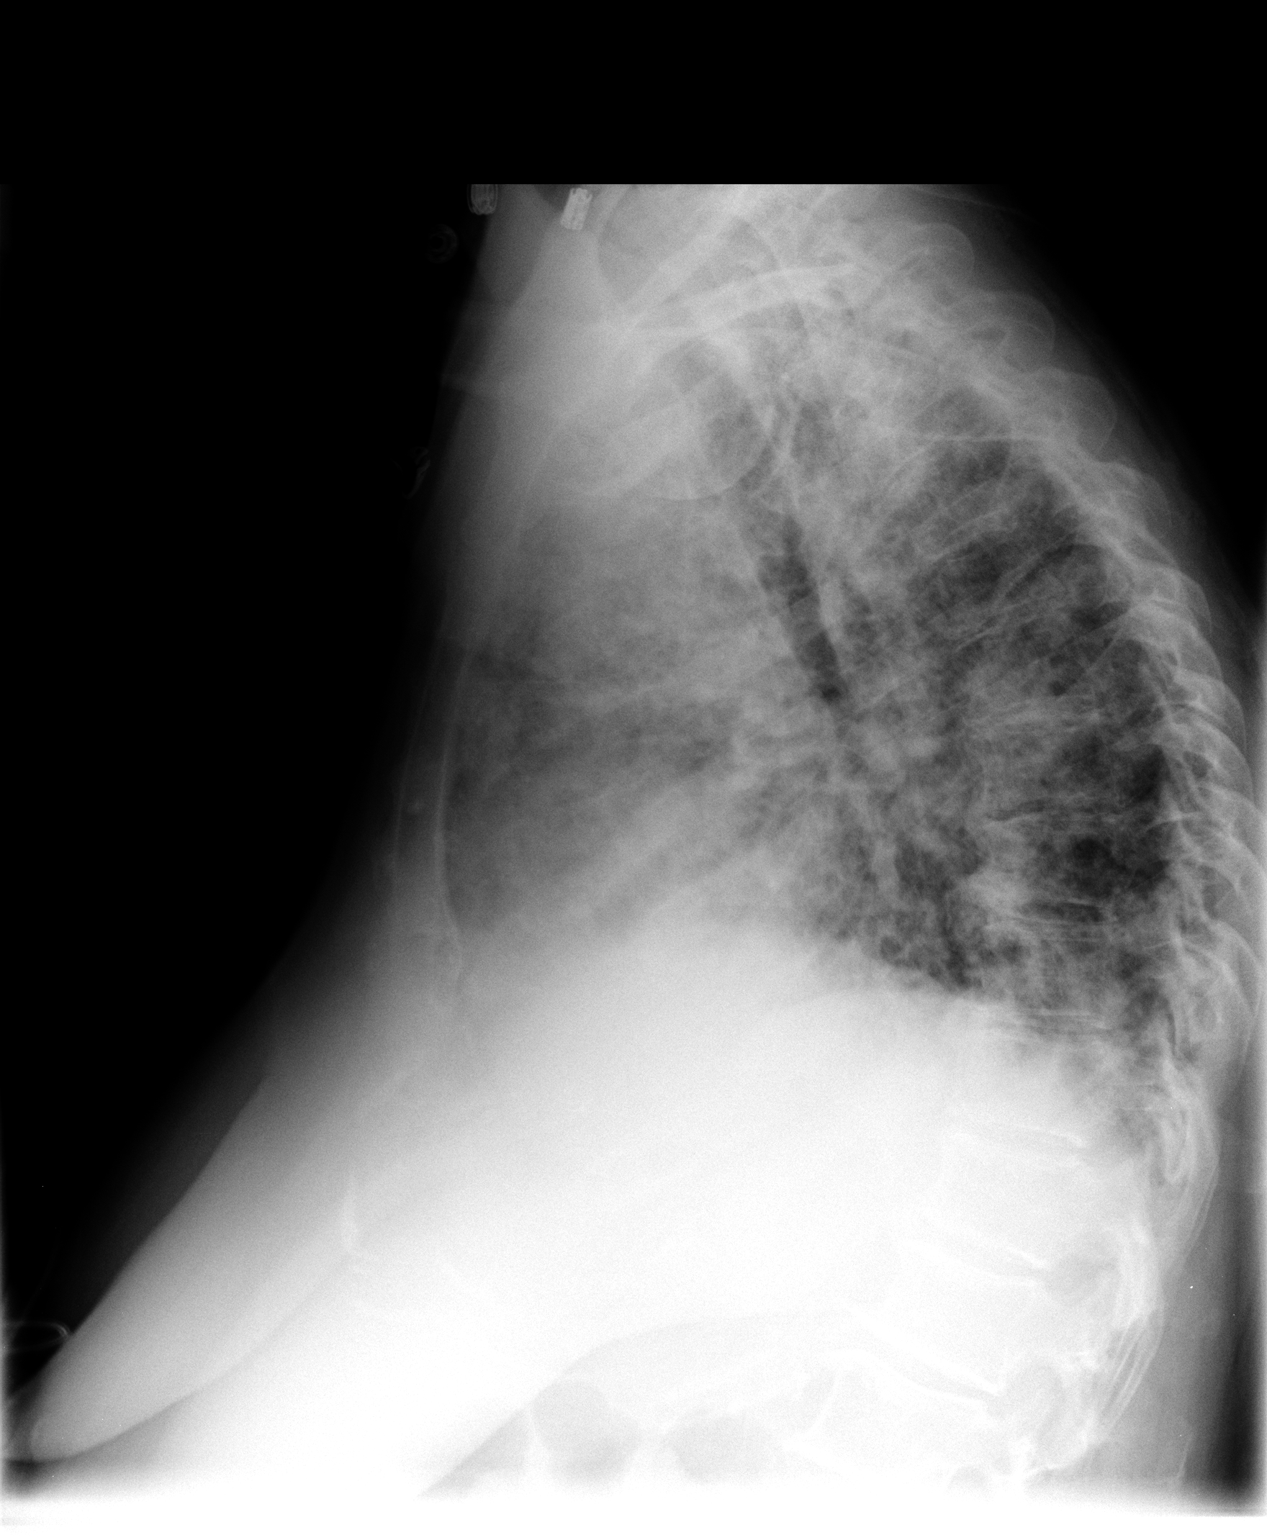

[2 of 2 positions shown; findings below may reference images not displayed]

FINDINGS: Mildly progressive patchy opacity throughout both lungs.
Borderline enlarged cardiac silhouette.  Thoracic spine
degenerative changes.  No pleural fluid.
IMPRESSION: Progressive extensive bilateral pneumonia.

## 2013-09-15 IMAGING — CR DG CHEST 1V PORT
1 series · 1 of 1 positions shown · non-contrast
Comparison: 12/30/2012

CLINICAL DATA: Respiratory distress.

PORTABLE CHEST - 1 VIEW

[view not recorded]
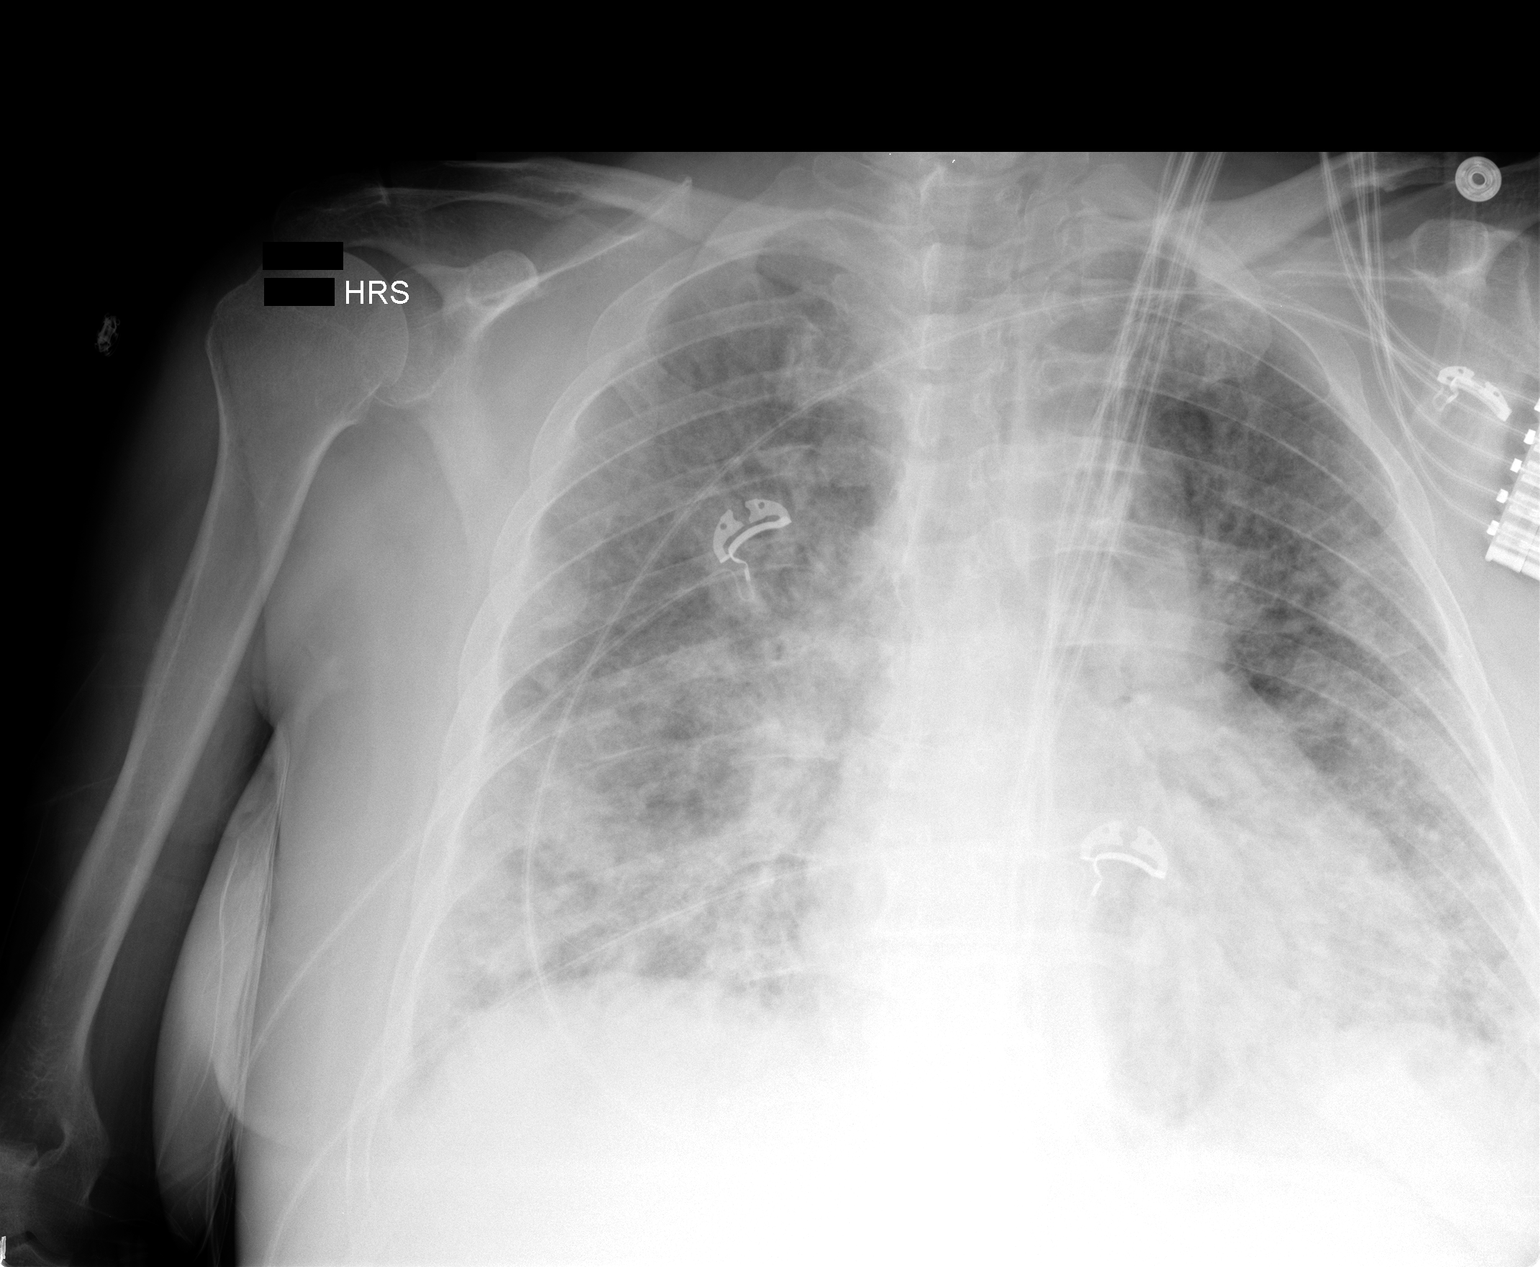

[1 of 1 positions shown; findings below may reference images not displayed]

FINDINGS: Mild cardiac enlargement.  Pulmonary vascular congestion.
Diffuse nodular airspace parenchymal disease throughout both lungs
which may be due to pneumonia, edema, or ARDS.  Changes are stable
since the previous study.  No pneumothorax.
IMPRESSION: Cardiac enlargement pulmonary vascular congestion and diffuse
bilateral airspace disease.  Stable appearance since previous
study.

## 2013-09-17 IMAGING — CR DG CHEST 1V PORT
1 series · 1 of 1 positions shown · non-contrast
Comparison: 12/31/2012 and earlier.

CLINICAL DATA: 61-year-old female with respiratory failure.
Pneumonia.

PORTABLE CHEST - 1 VIEW

[view not recorded]
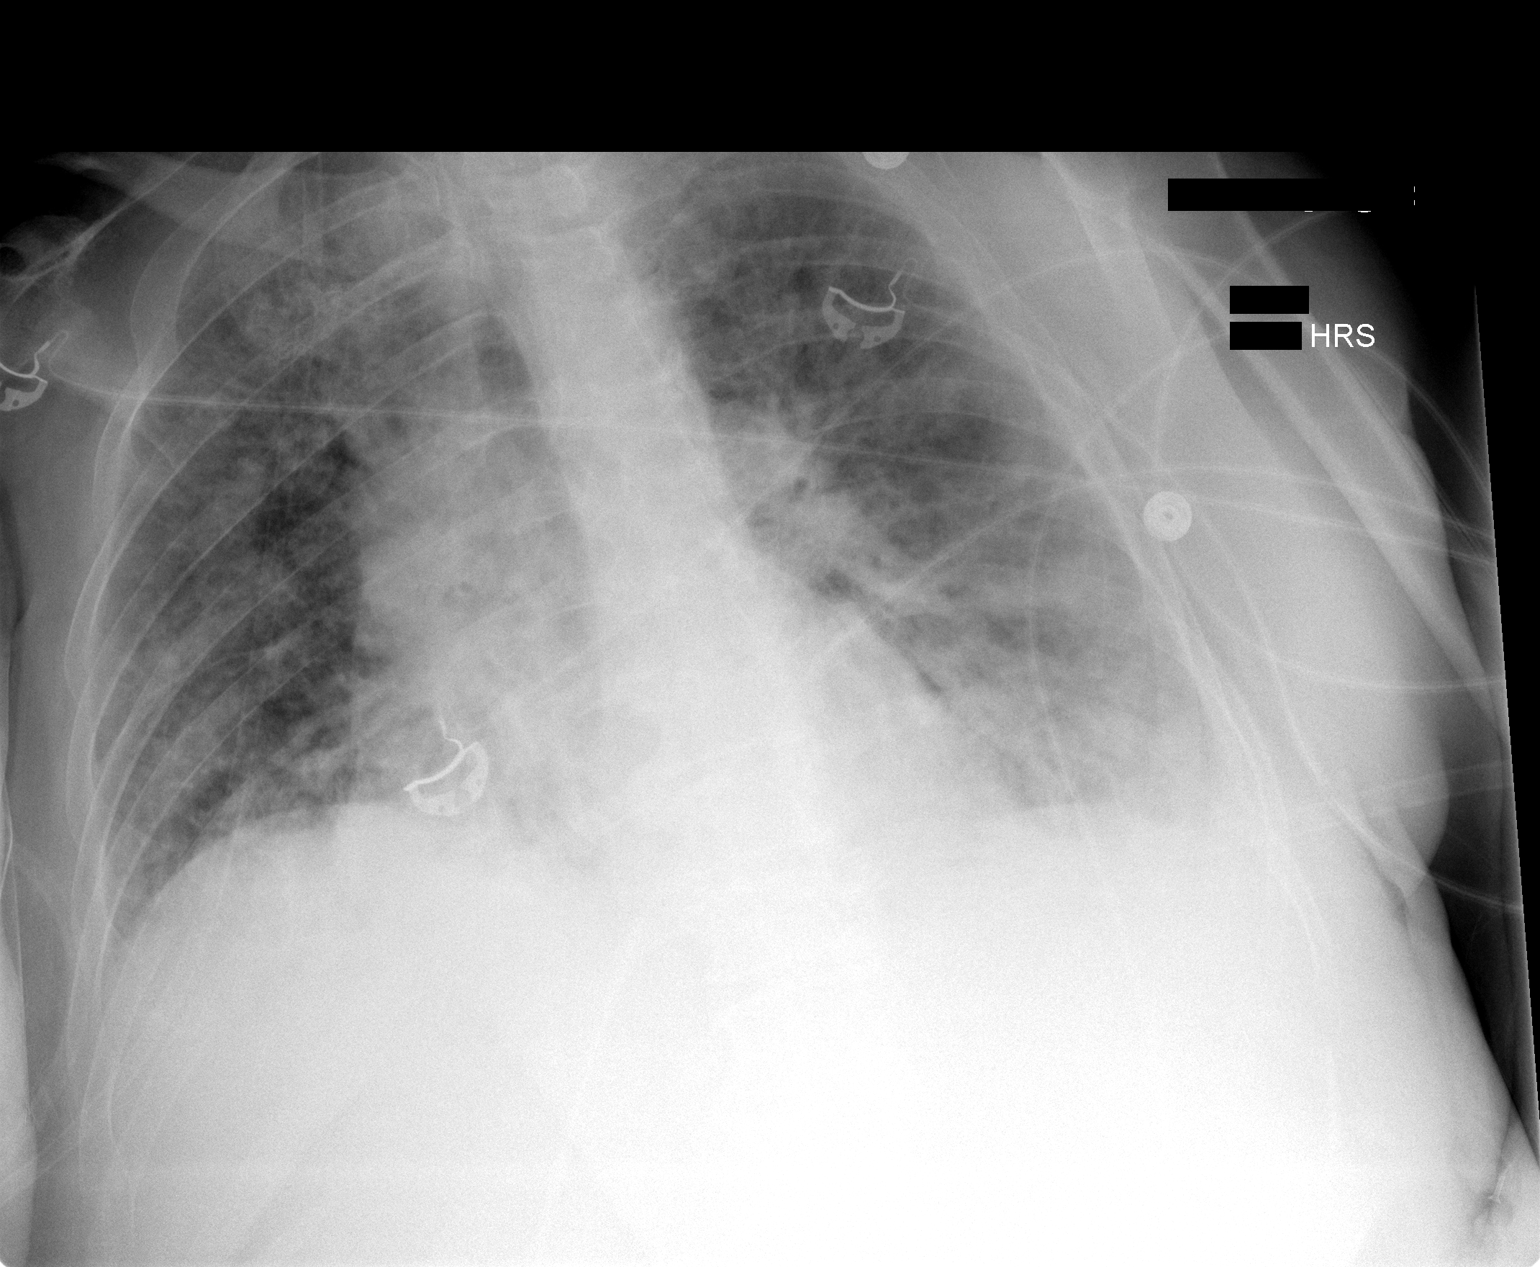

[1 of 1 positions shown; findings below may reference images not displayed]

FINDINGS: Portable semi upright AP view 2272 hours.  The patient is
more rotated to the right and lung volumes are mildly decreased.
Overall, no significant interval change and widespread bilateral
pulmonary opacity and 12/30/2012.  Stable cardiac size and
mediastinal contours.  No large effusion or pneumothorax
identified.
IMPRESSION: No significant change in bilateral pulmonary opacity favored to
represent pneumonia since 12/30/2012.

## 2013-09-20 IMAGING — CR DG CHEST 1V PORT
1 series · 1 of 1 positions shown · non-contrast
Comparison: Chest 01/02/2013 and 12/31/2012.

CLINICAL DATA: Pneumonia.  Altered mental status.

PORTABLE CHEST - 1 VIEW

[view not recorded]
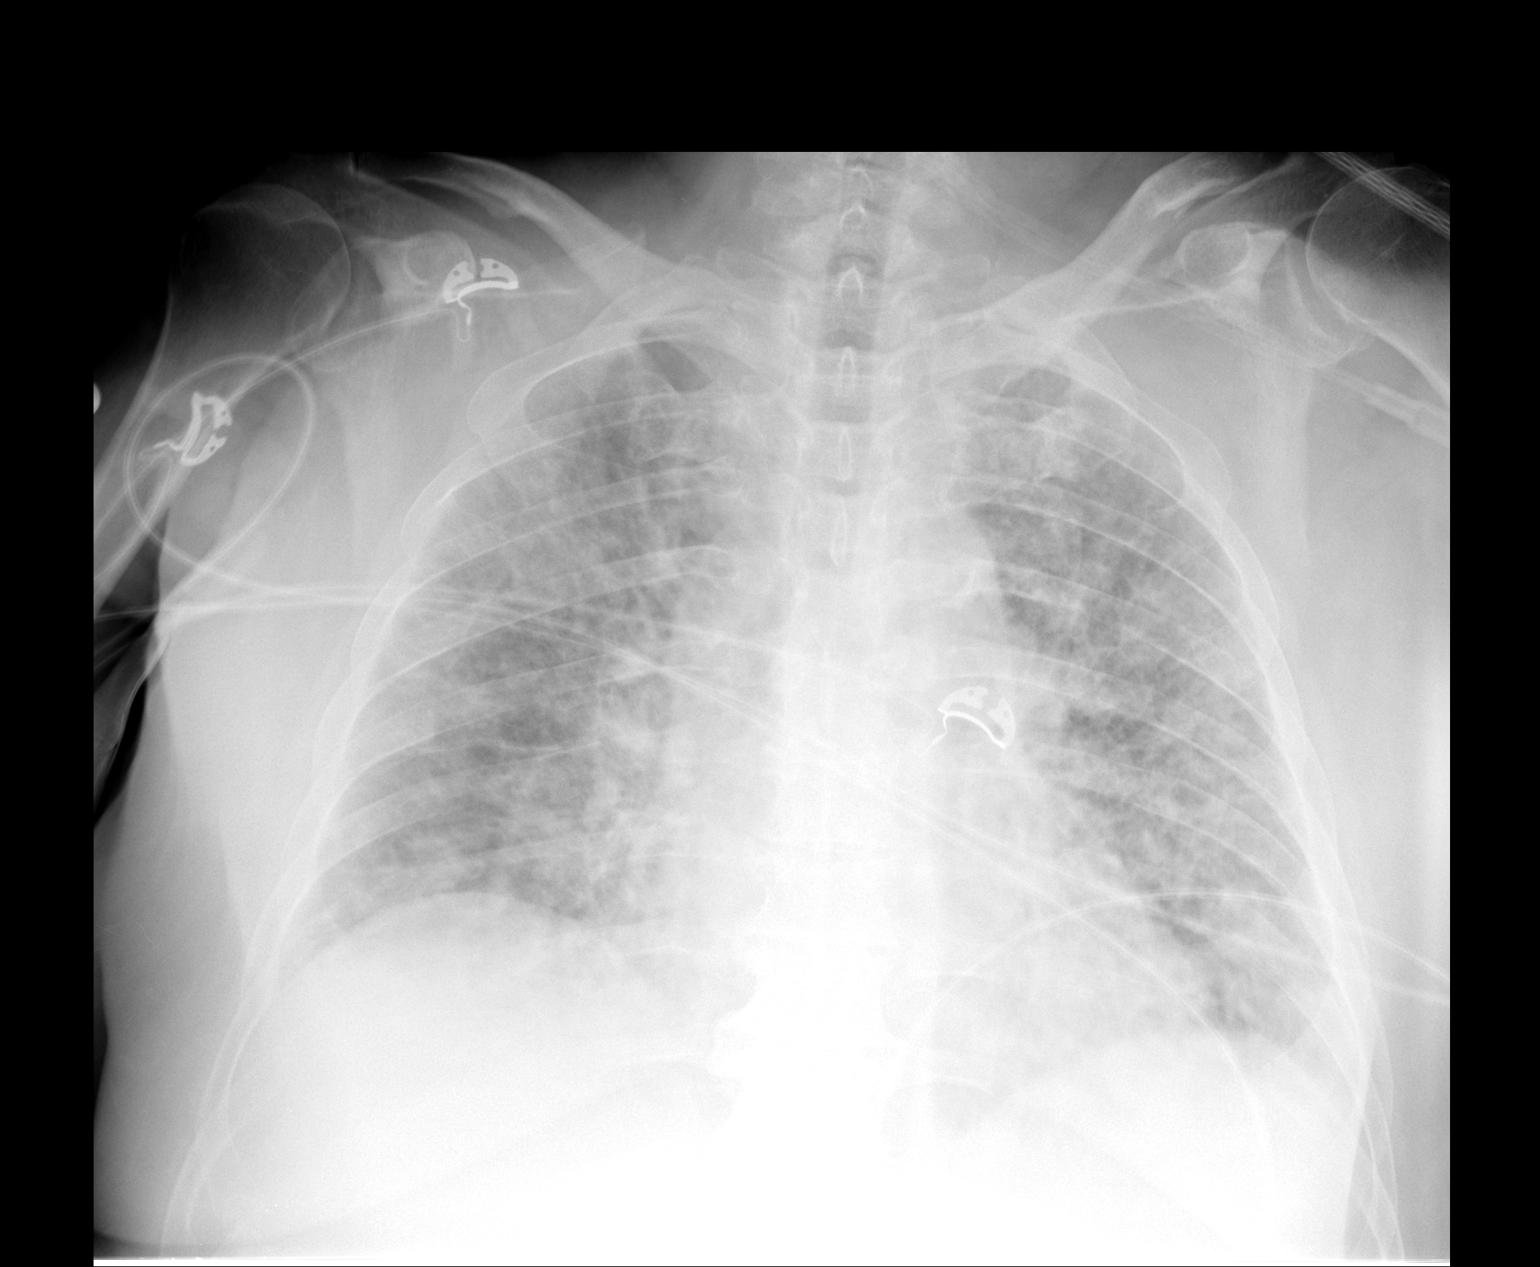

[1 of 1 positions shown; findings below may reference images not displayed]

FINDINGS: Diffuse bilateral airspace disease persists and appears
slightly worsened compared to the most recent study.  Heart size is
upper normal.  No pneumothorax or pleural effusion.
IMPRESSION: Some worsening in diffuse bilateral airspace disease consistent
with multifocal pneumonia and/or pulmonary edema.

## 2013-09-23 IMAGING — CR DG CHEST 1V PORT
1 series · 1 of 1 positions shown · non-contrast
Comparison: 01/05/2013 and earlier.

CLINICAL DATA: 61-year-old female PICC line placement.

PORTABLE CHEST - 1 VIEW

[view not recorded]
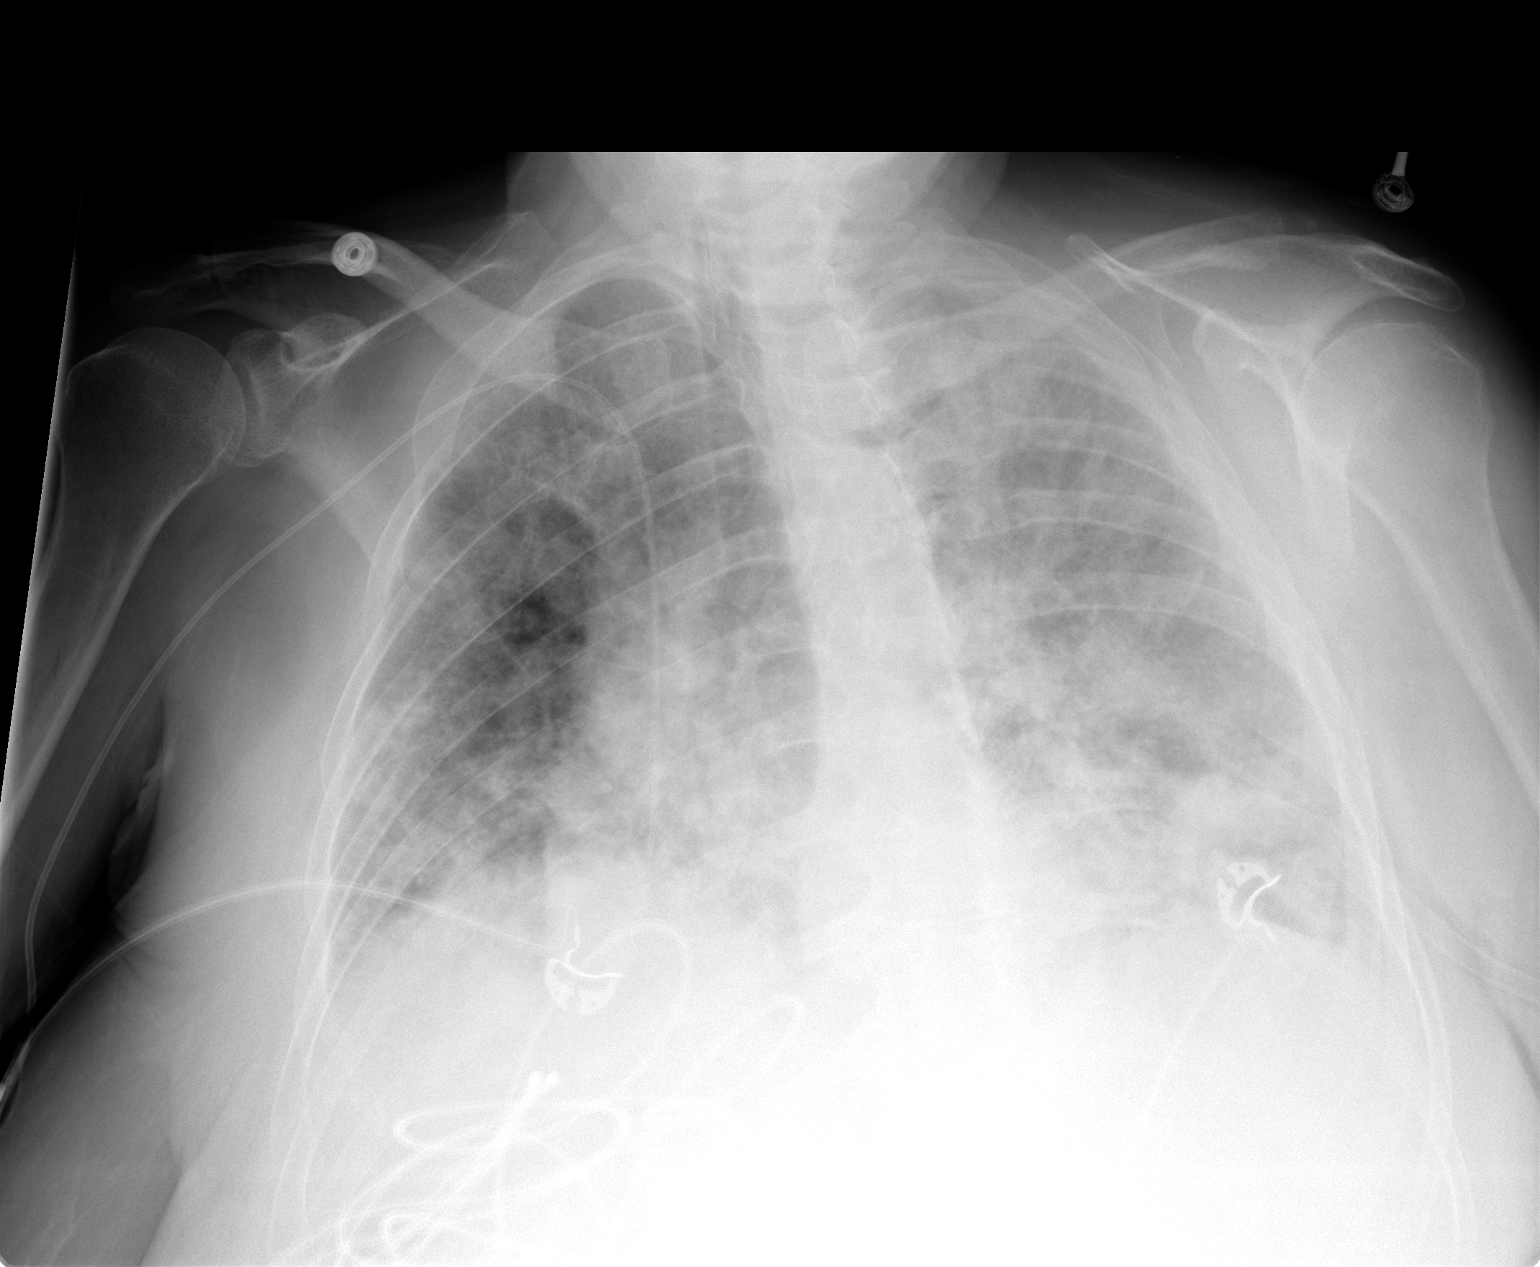

[1 of 1 positions shown; findings below may reference images not displayed]

FINDINGS: AP portable upright view at 2006 hours.  Lower lung
volumes.  Right side PICC line in place, tip projects at the level
of the right atrium.  Retract 1 cm for cavoatrial junction
placement.  Increased coarse and confluent bilateral pulmonary
opacity with obscuration of mediastinal contours.  No large pleural
effusion.  No pneumothorax. Visualized tracheal air column is
within normal limits.
IMPRESSION: 1.  Right PICC line placed, tip projects at the level of the right
atrium on this view with mildly lower lung volumes.  Retract 1 cm
for cavoatrial junction level placement.
2.  Worsening coarse bilateral pulmonary opacity, now obscuring the
mediastinal contours.

## 2013-09-27 ENCOUNTER — Encounter (HOSPITAL_COMMUNITY): Payer: Self-pay | Admitting: Emergency Medicine

## 2013-09-27 ENCOUNTER — Emergency Department (HOSPITAL_COMMUNITY): Payer: Medicare HMO

## 2013-09-27 ENCOUNTER — Observation Stay (HOSPITAL_COMMUNITY)
Admission: EM | Admit: 2013-09-27 | Discharge: 2013-09-29 | Disposition: A | Discharge status: Home / Self Care | Payer: Medicare HMO | Attending: Family Medicine | Admitting: Family Medicine

## 2013-09-27 DIAGNOSIS — G609 Hereditary and idiopathic neuropathy, unspecified: Secondary | ICD-10-CM

## 2013-09-27 DIAGNOSIS — I10 Essential (primary) hypertension: Secondary | ICD-10-CM | POA: Insufficient documentation

## 2013-09-27 DIAGNOSIS — F3289 Other specified depressive episodes: Secondary | ICD-10-CM | POA: Insufficient documentation

## 2013-09-27 DIAGNOSIS — J4489 Other specified chronic obstructive pulmonary disease: Secondary | ICD-10-CM

## 2013-09-27 DIAGNOSIS — F5105 Insomnia due to other mental disorder: Secondary | ICD-10-CM | POA: Diagnosis present

## 2013-09-27 DIAGNOSIS — J449 Chronic obstructive pulmonary disease, unspecified: Secondary | ICD-10-CM

## 2013-09-27 DIAGNOSIS — E785 Hyperlipidemia, unspecified: Secondary | ICD-10-CM

## 2013-09-27 DIAGNOSIS — F329 Major depressive disorder, single episode, unspecified: Secondary | ICD-10-CM | POA: Insufficient documentation

## 2013-09-27 DIAGNOSIS — R4182 Altered mental status, unspecified: Principal | ICD-10-CM | POA: Diagnosis present

## 2013-09-27 DIAGNOSIS — I2510 Atherosclerotic heart disease of native coronary artery without angina pectoris: Secondary | ICD-10-CM | POA: Insufficient documentation

## 2013-09-27 DIAGNOSIS — F411 Generalized anxiety disorder: Secondary | ICD-10-CM

## 2013-09-27 DIAGNOSIS — E119 Type 2 diabetes mellitus without complications: Secondary | ICD-10-CM

## 2013-09-27 DIAGNOSIS — J441 Chronic obstructive pulmonary disease with (acute) exacerbation: Secondary | ICD-10-CM | POA: Diagnosis present

## 2013-09-27 DIAGNOSIS — F1010 Alcohol abuse, uncomplicated: Secondary | ICD-10-CM | POA: Diagnosis present

## 2013-09-27 LAB — HEPATIC FUNCTION PANEL
AST: 21 U/L (ref 0–37)
Albumin: 3 g/dL — ABNORMAL LOW (ref 3.5–5.2)
Alkaline Phosphatase: 96 U/L (ref 39–117)
Total Bilirubin: 0.3 mg/dL (ref 0.3–1.2)

## 2013-09-27 LAB — RAPID URINE DRUG SCREEN, HOSP PERFORMED
Barbiturates: NOT DETECTED
Tetrahydrocannabinol: NOT DETECTED

## 2013-09-27 LAB — BASIC METABOLIC PANEL
BUN: 18 mg/dL (ref 6–23)
CO2: 24 mEq/L (ref 19–32)
Calcium: 9.3 mg/dL (ref 8.4–10.5)
Glucose, Bld: 111 mg/dL — ABNORMAL HIGH (ref 70–99)
Sodium: 132 mEq/L — ABNORMAL LOW (ref 135–145)

## 2013-09-27 LAB — CBC WITH DIFFERENTIAL/PLATELET
Eosinophils Absolute: 0.4 10*3/uL (ref 0.0–0.7)
Eosinophils Relative: 5 % (ref 0–5)
HCT: 36.8 % (ref 36.0–46.0)
Hemoglobin: 11.9 g/dL — ABNORMAL LOW (ref 12.0–15.0)
Lymphocytes Relative: 42 % (ref 12–46)
Lymphs Abs: 3.6 10*3/uL (ref 0.7–4.0)
MCH: 31.2 pg (ref 26.0–34.0)
MCV: 96.3 fL (ref 78.0–100.0)
Monocytes Relative: 5 % (ref 3–12)
Platelets: 169 10*3/uL (ref 150–400)
RBC: 3.82 MIL/uL — ABNORMAL LOW (ref 3.87–5.11)
WBC: 8.5 10*3/uL (ref 4.0–10.5)

## 2013-09-27 LAB — URINALYSIS, ROUTINE W REFLEX MICROSCOPIC
Bilirubin Urine: NEGATIVE
Glucose, UA: NEGATIVE mg/dL
Protein, ur: 100 mg/dL — AB
Specific Gravity, Urine: 1.01 (ref 1.005–1.030)

## 2013-09-27 LAB — BLOOD GAS, ARTERIAL
Acid-Base Excess: 1.1 mmol/L (ref 0.0–2.0)
Drawn by: 22223
FIO2: 21 %
O2 Saturation: 91.7 %
pCO2 arterial: 45.7 mmHg — ABNORMAL HIGH (ref 35.0–45.0)
pH, Arterial: 7.371 (ref 7.350–7.450)

## 2013-09-27 LAB — GLUCOSE, CAPILLARY: Glucose-Capillary: 106 mg/dL — ABNORMAL HIGH (ref 70–99)

## 2013-09-27 MED ORDER — LORAZEPAM 2 MG/ML IJ SOLN
1.0000 mg | Freq: Once | INTRAMUSCULAR | Status: AC
  Administered 2013-09-27: 1 mg via INTRAVENOUS
  Filled 2013-09-27: qty 1

## 2013-09-27 MED ORDER — NALOXONE HCL 1 MG/ML IJ SOLN
1.0000 mg | Freq: Once | INTRAMUSCULAR | Status: AC
  Administered 2013-09-27: 1 mg via INTRAVENOUS
  Filled 2013-09-27: qty 2

## 2013-09-27 NOTE — ED Provider Notes (Signed)
CSN: [id number]     Arrival date & time 09/27/13  1938 History   First MD Initiated Contact with Patient 09/27/13 1945      This chart was scribed for Maudry Diego, MD by Forrestine Him, ED Scribe. This patient was seen in room APA19/APA19 and the patient's care was started 7:49 PM.   Chief Complaint  Patient presents with  . Weakness  . Dizziness   Patient is a 62 y.o. female presenting with weakness. The history is provided by the patient. No language interpreter was used.  Weakness This is a recurrent problem. The current episode started 3 to 5 hours ago. The problem occurs constantly. The problem has not changed since onset.Pertinent negatives include no chest pain, no abdominal pain and no headaches. Nothing aggravates the symptoms. Nothing relieves the symptoms. She has tried nothing for the symptoms.    HPI Comments: [name] is a 62 y.o. Female with h/o DM, COPD, ASCVD, hyperlipidemia, hypothyroidism, DVT,and amputation of the left hand bought in by ambulance who presents to the Emergency Department complaining of generalized tremors that started prior to arrival. She also reports associated dizziness and weakness. Pt states this has happened before in the past, but was not given a diagnosis.   Past Medical History  Diagnosis Date  . Diabetes mellitus   . COPD (chronic obstructive pulmonary disease)   . ASCVD (arteriosclerotic cardiovascular disease)     MI in 96 requiring BMS CX; DES to M1 in 2000;normal coronary angiography in 2004  . Hyperlipidemia   . Hypothyroidism   . Tobacco abuse   . Depression   . DVT (deep venous thrombosis)   . Nephrolithiasis 2006    stone extraction   . Amputation of hand, right     traumatic  . Allergic rhinitis   . Cholelithiasis   . GERD (gastroesophageal reflux disease)   . Low back pain   . Peripheral neuropathy   . Diabetes mellitus type I   . DDD (degenerative disc disease), lumbar   . Sciatic pain     right  . Tremor     Past Surgical History  Procedure Laterality Date  . Dilation and curettage of uterus  1974  . Partial hysterectomy  1978  . Total abdominal hysterectomy w/ bilateral salpingoophorectomy  2002  . Shoulder surgery      Left shoulder for RTC;left arm surgery '98/left hand surgery 2001  . Cholecystectomy    . Umbilical hernia repair  2008  . Colonoscopy  01/2009    nl repeat in 5  years  . Back surgery    . Hand amputation Right     traumatic   Family History  Problem Relation Age of Onset  . Depression Mother   . Bipolar disorder Mother   . Dementia Mother   . Alcohol abuse Father    History  Substance Use Topics  . Smoking status: Current Every Day Smoker -- 0.50 packs/day for 40 years    Types: Cigarettes  . Smokeless tobacco: Not on file  . Alcohol Use: No   OB History   Grav Para Term Preterm Abortions TAB SAB Ect Mult Living                 Review of Systems  Constitutional: Negative for appetite change and fatigue.  HENT: Negative for congestion, ear discharge and sinus pressure.   Eyes: Negative for discharge.  Respiratory: Negative for cough.   Cardiovascular: Negative for chest pain.  Gastrointestinal:  Negative for abdominal pain and diarrhea.  Genitourinary: Negative for frequency and hematuria.  Musculoskeletal: Negative for back pain.  Skin: Negative for rash.  Neurological: Positive for dizziness, tremors and weakness. Negative for seizures and headaches.  Psychiatric/Behavioral: Negative for hallucinations.    Allergies  Ciprofloxacin; Iohexol; Neomycin-bacitracin zn-polymyx; Penicillins; and Povidone  Home Medications   Current Outpatient Rx  Name  Route  Sig  Dispense  Refill  . ARIPiprazole (ABILIFY) 5 MG tablet   Oral   Take 1 tablet (5 mg total) by mouth daily.   30 tablet   2   . aspirin 81 MG chewable tablet   Oral   Chew 81 mg by mouth daily.         . benztropine (COGENTIN) 1 MG tablet   Oral   Take 1 tablet (1 mg total) by  mouth at bedtime.   30 tablet   2   . Cholecalciferol (VITAMIN D-3) 1000 UNITS CAPS   Oral   Take 1 capsule by mouth daily.         . clonazePAM (KLONOPIN) 0.5 MG tablet      take1 tab bid   60 tablet   2   . colesevelam (WELCHOL) 625 MG tablet   Oral   Take 625 mg by mouth daily.         . DULoxetine (CYMBALTA) 20 MG capsule   Oral   Take 2 capsules (40 mg total) by mouth 2 (two) times daily.   120 capsule   2   . ferrous sulfate 325 (65 FE) MG tablet   Oral   Take 325 mg by mouth 2 (two) times daily.         . fish oil-omega-3 fatty acids 1000 MG capsule   Oral   Take 2 g by mouth daily.           . furosemide (LASIX) 20 MG tablet   Oral   Take 1 tablet (20 mg total) by mouth daily.   30 tablet   6   . levothyroxine (SYNTHROID, LEVOTHROID) 200 MCG tablet   Oral   Take 400 mcg by mouth daily.         Marland Kitchen lisinopril (PRINIVIL,ZESTRIL) 20 MG tablet   Oral   Take 20 mg by mouth daily.           . metFORMIN (GLUCOPHAGE) 500 MG tablet   Oral   Take 500 mg by mouth 2 (two) times daily.         . metoprolol tartrate (LOPRESSOR) 12.5 mg TABS tablet   Oral   Take 0.5 tablets (12.5 mg total) by mouth 2 (two) times daily.   30 tablet   6   . naproxen (NAPROSYN) 500 MG tablet   Oral   Take 500 mg by mouth 2 (two) times daily with a meal.         . niacin (NIASPAN) 1000 MG CR tablet   Oral   Take 1,000 mg by mouth at bedtime.           . nitroGLYCERIN (NITROSTAT) 0.4 MG SL tablet   Sublingual   Place 0.4 mg under the tongue every 5 (five) minutes as needed. Chest pain         . pravastatin (PRAVACHOL) 40 MG tablet   Oral   Take 40 mg by mouth daily.           . traMADol (ULTRAM) 50 MG tablet   Oral   Take  50 mg by mouth 3 (three) times daily.         . traZODone (DESYREL) 100 MG tablet   Oral   Take 1 tablet (100 mg total) by mouth at bedtime.   30 tablet   2   . vitamin C (ASCORBIC ACID) 500 MG tablet   Oral   Take 500 mg  by mouth 2 (two) times daily.          Triage Vitals: BP 147/70  Pulse 75  Temp(Src) 98 F (36.7 C) (Oral)  Resp 18  Ht 5' 6.5" (1.689 m)  Wt 153 lb (69.4 kg)  BMI 24.33 kg/m2  SpO2 92%  Physical Exam  Constitutional: She appears well-developed.  HENT:  Head: Normocephalic.  Eyes: Conjunctivae and EOM are normal. No scleral icterus.  Neck: Neck supple. No thyromegaly present.  Cardiovascular: Normal rate and regular rhythm.  Exam reveals no gallop and no friction rub.   No murmur heard. Pulmonary/Chest: No stridor. She has no wheezes. She has no rales. She exhibits no tenderness.  Abdominal: She exhibits no distension. There is no tenderness. There is no rebound.  Musculoskeletal: Normal range of motion. She exhibits no edema.  Lymphadenopathy:    She has no cervical adenopathy.  Neurological: She exhibits normal muscle tone. Coordination normal.  Tremors to the left arm,  Oriented to person and place  Skin: No rash noted. No erythema.  Psychiatric: She has a normal mood and affect. Her behavior is normal.    ED Course  Procedures (including critical care time)  DIAGNOSTIC STUDIES: Oxygen Saturation is 92% on RA, adequate by my interpretation.    COORDINATION OF CARE: 7:48 PM- Will order blood work. Will give Ativan. Discussed treatment plan with pt at bedside and pt agreed to plan.     Labs Review Labs Reviewed - No data to display Imaging Review No results found.  EKG Interpretation    Date/Time:  Sunday September 27 2013 22:26:51 EST Ventricular Rate:  66 PR Interval:  154 QRS Duration: 92 QT Interval:  442 QTC Calculation: 463 R Axis:     Text Interpretation:  Normal sinus rhythm with sinus arrhythmia Nonspecific T wave abnormality Abnormal ECG When compared with ECG of 04-Jun-2013 12:32, Premature ventricular complexes are no longer Present Criteria for Septal infarct are no longer Present T wave amplitude has decreased in Anterior leads QT has lengthened  Confirmed by CAMPOS  MD, KEVIN ([id number]) on 09/28/2013 12:20:21 AM            MDM  Altered mental status with tremor.  uti    The chart was scribed for me under my direct supervision.  I personally performed the history, physical, and medical decision making and all procedures in the evaluation of this patient.Maudry Diego, MD 09/28/13 [phone]

## 2013-09-27 NOTE — ED Notes (Signed)
Pt's husband called ems for pt with weakness

## 2013-09-28 DIAGNOSIS — R4182 Altered mental status, unspecified: Secondary | ICD-10-CM | POA: Diagnosis present

## 2013-09-28 DIAGNOSIS — F411 Generalized anxiety disorder: Secondary | ICD-10-CM

## 2013-09-28 DIAGNOSIS — F1010 Alcohol abuse, uncomplicated: Secondary | ICD-10-CM

## 2013-09-28 DIAGNOSIS — J449 Chronic obstructive pulmonary disease, unspecified: Secondary | ICD-10-CM

## 2013-09-28 LAB — CBC
MCH: 31.2 pg (ref 26.0–34.0)
Platelets: 208 10*3/uL (ref 150–400)
RBC: 3.43 MIL/uL — ABNORMAL LOW (ref 3.87–5.11)
RDW: 13.2 % (ref 11.5–15.5)
WBC: 7.9 10*3/uL (ref 4.0–10.5)

## 2013-09-28 LAB — PRO B NATRIURETIC PEPTIDE: Pro B Natriuretic peptide (BNP): 244.5 pg/mL — ABNORMAL HIGH (ref 0–125)

## 2013-09-28 LAB — GLUCOSE, CAPILLARY: Glucose-Capillary: 134 mg/dL — ABNORMAL HIGH (ref 70–99)

## 2013-09-28 IMAGING — CR DG CHEST 1V PORT
1 series · 1 of 1 positions shown · non-contrast
Comparison: 01/08/2013

CLINICAL DATA: PICC placement

PORTABLE CHEST - 1 VIEW

[view not recorded]
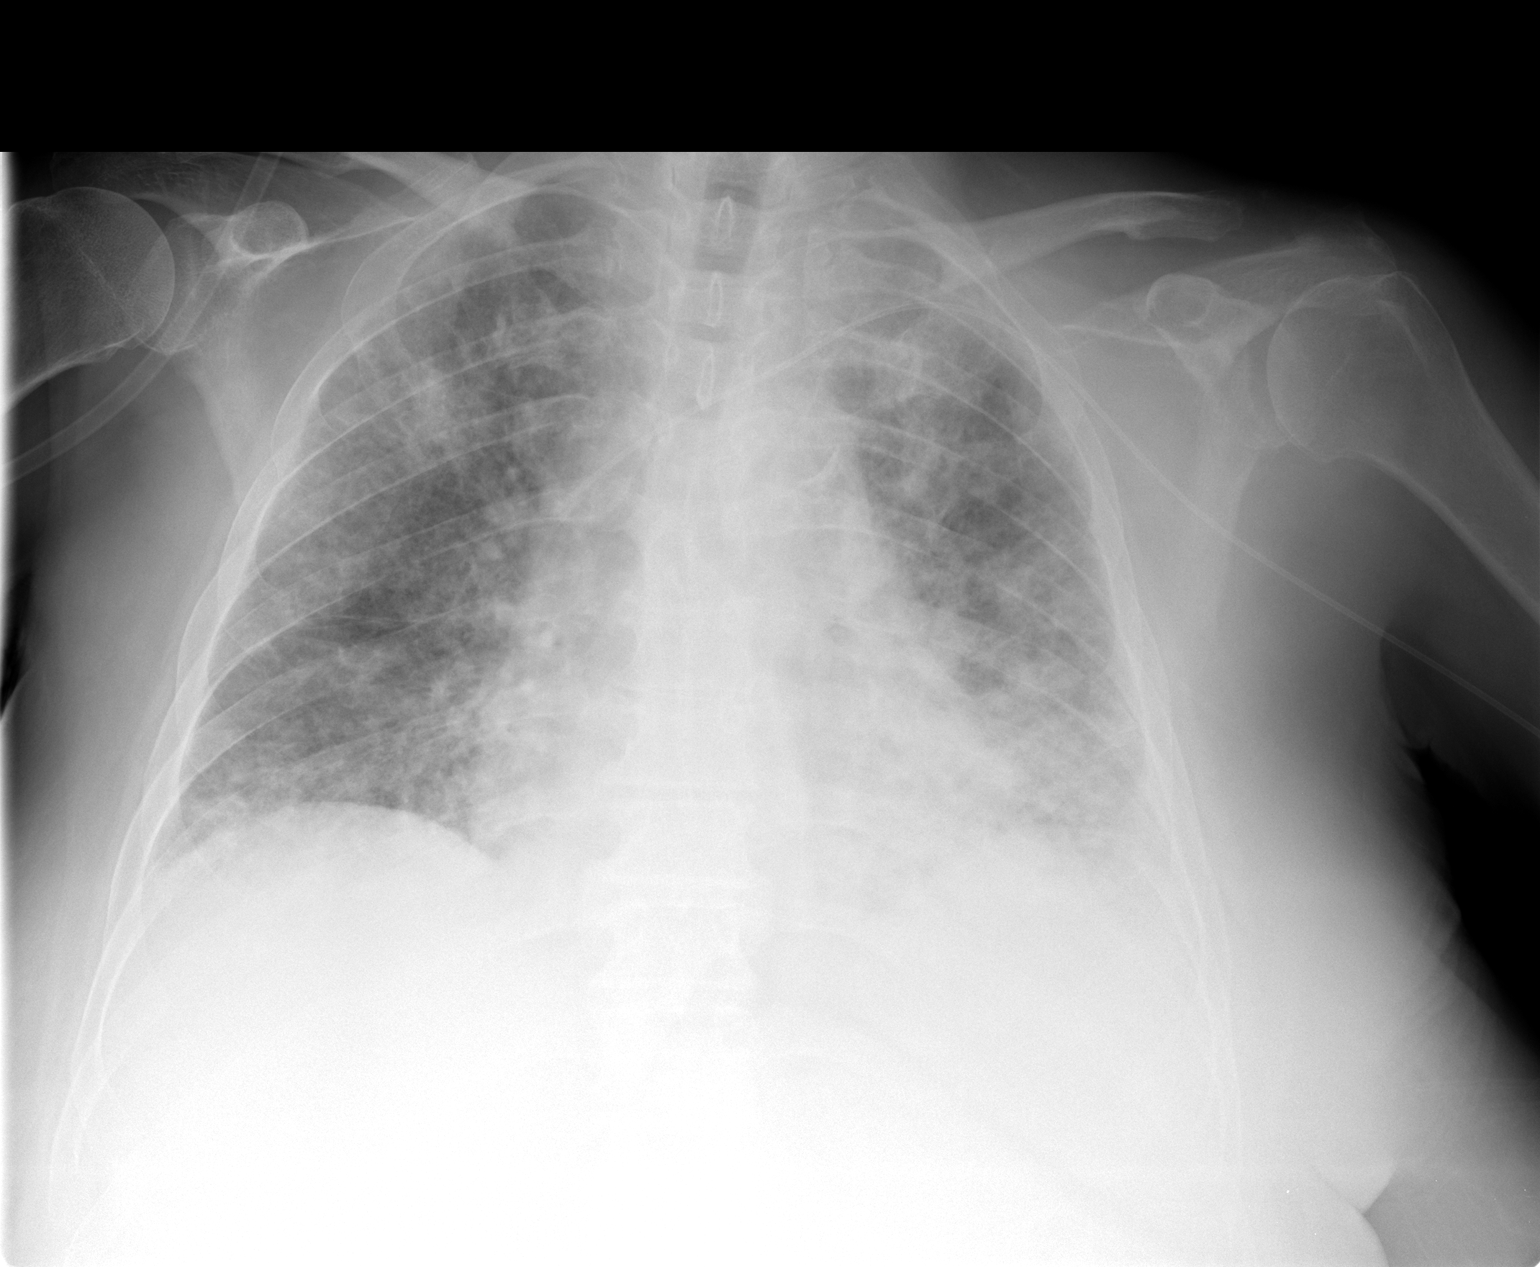

[1 of 1 positions shown; findings below may reference images not displayed]

FINDINGS: Right arm PICC has been removed.

Left arm PICC is in place with  the tip in the mid SVC.

Severe diffuse bilateral airspace disease is unchanged from prior
studies.
IMPRESSION: Left arm PICC tip in the SVC.

No change severe diffuse bilateral airspace disease.

## 2013-09-28 MED ORDER — SIMVASTATIN 20 MG PO TABS
20.0000 mg | ORAL_TABLET | Freq: Every day | ORAL | Status: DC
  Administered 2013-09-28: 20 mg via ORAL
  Filled 2013-09-28: qty 1

## 2013-09-28 MED ORDER — NITROGLYCERIN 0.4 MG SL SUBL: 0.4000 mg | SUBLINGUAL_TABLET | SUBLINGUAL | Status: DC | PRN

## 2013-09-28 MED ORDER — ARIPIPRAZOLE 10 MG PO TABS
5.0000 mg | ORAL_TABLET | Freq: Every day | ORAL | Status: DC
  Administered 2013-09-28: 5 mg via ORAL
  Filled 2013-09-28: qty 1

## 2013-09-28 MED ORDER — METOPROLOL TARTRATE 25 MG PO TABS
12.5000 mg | ORAL_TABLET | Freq: Two times a day (BID) | ORAL | Status: DC
  Administered 2013-09-28 (×2): 12.5 mg via ORAL
  Filled 2013-09-28 (×3): qty 1

## 2013-09-28 MED ORDER — COLESEVELAM HCL 625 MG PO TABS
625.0000 mg | ORAL_TABLET | Freq: Every day | ORAL | Status: DC
  Administered 2013-09-29: 625 mg via ORAL
  Filled 2013-09-28 (×2): qty 1

## 2013-09-28 MED ORDER — METFORMIN HCL 500 MG PO TABS: 500.0000 mg | ORAL_TABLET | Freq: Two times a day (BID) | ORAL | Status: DC

## 2013-09-28 MED ORDER — LISINOPRIL 10 MG PO TABS
20.0000 mg | ORAL_TABLET | Freq: Every day | ORAL | Status: DC
  Administered 2013-09-28: 20 mg via ORAL
  Filled 2013-09-28: qty 2

## 2013-09-28 MED ORDER — VITAMIN B-1 100 MG PO TABS
100.0000 mg | ORAL_TABLET | Freq: Every day | ORAL | Status: DC
  Administered 2013-09-28: 100 mg via ORAL
  Filled 2013-09-28: qty 1

## 2013-09-28 MED ORDER — LORAZEPAM 1 MG PO TABS: 1.0000 mg | ORAL_TABLET | Freq: Four times a day (QID) | ORAL | Status: DC | PRN

## 2013-09-28 MED ORDER — DEXTROSE 5 % IV SOLN
1.0000 g | Freq: Once | INTRAVENOUS | Status: AC
  Administered 2013-09-28: 1 g via INTRAVENOUS
  Filled 2013-09-28: qty 10

## 2013-09-28 MED ORDER — SODIUM CHLORIDE 0.9 % IV SOLN: INTRAVENOUS | Status: AC

## 2013-09-28 MED ORDER — FOLIC ACID 1 MG PO TABS
1.0000 mg | ORAL_TABLET | Freq: Every day | ORAL | Status: DC
  Administered 2013-09-28: 1 mg via ORAL
  Filled 2013-09-28: qty 1

## 2013-09-28 MED ORDER — MELATONIN 3 MG PO TABS: 1.0000 | ORAL_TABLET | Freq: Every evening | ORAL | Status: DC | PRN

## 2013-09-28 MED ORDER — OMEGA-3 FATTY ACIDS 1000 MG PO CAPS: 2.0000 g | ORAL_CAPSULE | Freq: Every day | ORAL | Status: DC

## 2013-09-28 MED ORDER — TRAZODONE HCL 50 MG PO TABS
100.0000 mg | ORAL_TABLET | Freq: Every day | ORAL | Status: DC
  Administered 2013-09-28: 100 mg via ORAL
  Filled 2013-09-28: qty 2

## 2013-09-28 MED ORDER — VITAMIN D-3 25 MCG (1000 UT) PO CAPS: 1.0000 | ORAL_CAPSULE | Freq: Every day | ORAL | Status: DC

## 2013-09-28 MED ORDER — LEVOTHYROXINE SODIUM 100 MCG PO TABS
200.0000 ug | ORAL_TABLET | Freq: Every day | ORAL | Status: DC
  Administered 2013-09-29: 200 ug via ORAL
  Filled 2013-09-28: qty 2

## 2013-09-28 MED ORDER — TRAMADOL HCL 50 MG PO TABS
50.0000 mg | ORAL_TABLET | Freq: Three times a day (TID) | ORAL | Status: DC
  Administered 2013-09-28 (×2): 50 mg via ORAL
  Filled 2013-09-28 (×2): qty 1

## 2013-09-28 MED ORDER — OMEGA-3-ACID ETHYL ESTERS 1 G PO CAPS
1.0000 g | ORAL_CAPSULE | Freq: Every day | ORAL | Status: DC
  Administered 2013-09-28: 1 g via ORAL
  Filled 2013-09-28: qty 1

## 2013-09-28 MED ORDER — VITAMIN D 1000 UNITS PO TABS
1000.0000 [IU] | ORAL_TABLET | Freq: Every day | ORAL | Status: DC
  Administered 2013-09-28: 1000 [IU] via ORAL
  Filled 2013-09-28: qty 1

## 2013-09-28 MED ORDER — ADULT MULTIVITAMIN W/MINERALS CH
1.0000 | ORAL_TABLET | Freq: Every day | ORAL | Status: DC
  Administered 2013-09-28: 1 via ORAL
  Filled 2013-09-28: qty 1

## 2013-09-28 MED ORDER — LORAZEPAM 2 MG/ML IJ SOLN
1.0000 mg | Freq: Four times a day (QID) | INTRAMUSCULAR | Status: DC | PRN
Start: 2013-09-28 — End: 2013-09-29

## 2013-09-28 MED ORDER — ASPIRIN 81 MG PO CHEW
81.0000 mg | CHEWABLE_TABLET | Freq: Every day | ORAL | Status: DC
  Administered 2013-09-28: 81 mg via ORAL
  Filled 2013-09-28: qty 1

## 2013-09-28 MED ORDER — THIAMINE HCL 100 MG/ML IJ SOLN: 100.0000 mg | Freq: Every day | INTRAMUSCULAR | Status: DC

## 2013-09-28 MED ORDER — NITROGLYCERIN 0.4 MG/SPRAY TL SOLN
1.0000 | Status: DC | PRN
Start: 2013-09-28 — End: 2013-09-28

## 2013-09-28 NOTE — Progress Notes (Signed)
UR completed 

## 2013-09-28 NOTE — Progress Notes (Signed)
Nutrition Brief Note  Patient identified on the Malnutrition Screening Tool (MST) Report  Pt is and employee of [employer]. Chronic tremor. Presented to ED with increased weakness. Good appetite observed today, 100% of lunch consumed. Denies unplanned wt loss and is asking to go home.  Pt says Dr told her will likely be discharged tomorrow.    Wt Readings from Last 15 Encounters:  09/27/13 153 lb (69.4 kg)  08/24/13 156 lb (70.761 kg)  06/03/13 171 lb 8.3 oz (77.8 kg)  05/14/13 163 lb (73.936 kg)  01/05/13 172 lb 13.5 oz (78.4 kg)  10/02/12 186 lb 6.4 oz (84.55 kg)  09/02/12 188 lb (85.276 kg)  07/06/12 190 lb (86.183 kg)  05/06/12 190 lb (86.183 kg)  03/06/12 190 lb (86.183 kg)  01/03/12 196 lb (88.905 kg)  11/06/11 193 lb (87.544 kg)  09/04/10 206 lb (93.441 kg)  08/22/10 202 lb (91.627 kg)  05/04/10 209 lb (94.802 kg)    Body mass index is 24.33 kg/(m^2). Patient meets criteria for normal range based on current BMI.   Current diet order is CHO Modified, patient is consuming approximately 75-100% of meals at this time. Labs and medications reviewed.   No nutrition interventions warranted at this time. Colman Cater MS,RD,CSG,LDN Office: [phone] Pager: [phone]

## 2013-09-28 NOTE — Progress Notes (Signed)
Pharmacist - Physician Communication  Concerning - Metformin Safe Administration  Description: The Pharmacy and Therapeutics Committee has adopted a policy that restricts the use of metformin in hospitalized patients until all the contraindications to administration have been ruled out.   Specific contraindications are:  Serum creatinine >/= 1.5 for males  Serum creatinine >/= 1.4 for females  Shock, acute MI, sepsis, hypoxemia, dehydration  Planned administration of intravenous iodinated contrast media  Heart failure patients with a low ejection fraction  Acute or chronic metabolic acidosis (indluding diabetic ketoacidosis).   Recommendation: Metformin order has been rejected and should only be reordered after the about conditions are ruled out or corrected.  Current safety recommendations include avoiding metformin for a minimum of 48 hours after the patient's exposure to intravenous contrast media.   Netta Cedars, PharmD, BCPS 09/28/2013@1 :04 PM

## 2013-09-28 NOTE — Progress Notes (Signed)
PHARMACIST - PHYSICIAN ORDER COMMUNICATION  CONCERNING: P&T Medication Policy on Herbal Medications  DESCRIPTION:  This patient's order for:  melatonin  has been noted.  This product(s) is classified as an "herbal" or natural product. Due to a lack of definitive safety studies or FDA approval, nonstandard manufacturing practices, plus the potential risk of unknown drug-drug interactions while on inpatient medications, the Pharmacy and Therapeutics Committee does not permit the use of "herbal" or natural products of this type within [facility].   ACTION TAKEN: The pharmacy department is unable to verify this order at this time and your patient has been informed of this safety policy. Please reevaluate patient's clinical condition at discharge and address if the herbal or natural product(s) should be resumed at that time.  Netta Cedars, PharmD, BCPS 09/28/2013@1 :03 PM

## 2013-09-28 NOTE — ED Notes (Signed)
Report called to [place], RN on unit 300.

## 2013-09-28 NOTE — Care Management Note (Signed)
    Page 1 of 1   09/28/2013     3:05:44 PM   CARE MANAGEMENT NOTE 09/28/2013  Patient:  [name], [name]   Account Number:  [id number]  Date Initiated:  09/28/2013  Documentation initiated by:  Claretha Cooper  Subjective/Objective Assessment:   Pt admitted from home where she lives with her spouse. Pt alert and oriented. States she does not need HH.     Action/Plan:   Anticipated DC Date:  09/28/2013   Anticipated DC Plan:  [place]  CM consult      Choice offered to / List presented to:             Status of service:  Completed, signed off Medicare Important Message given?   (If response is "NO", the following Medicare IM given date fields will be blank) Date Medicare IM given:   Date Additional Medicare IM given:    Discharge Disposition:    Per UR Regulation:    If discussed at Long Length of Stay Meetings, dates discussed:    Comments:  09/28/13 Claretha Cooper RN BSN CM

## 2013-09-28 NOTE — Progress Notes (Signed)
Patient has skin tear to right arm measuring approximately 1x 1. Has blanchable redness to sacrum.

## 2013-09-28 NOTE — Progress Notes (Signed)
[id number] 

## 2013-09-28 NOTE — ED Provider Notes (Signed)
1:29 AM Pt is not able to tell me why she came to the ER tonight. She states "I came to get help for my daughter".  Unclear how much of this was related to the benzodiazepines given earlier in the evening. Admit for ongoing evaluation. Not convinced by urine. Labs without abnormalities. Observation admission warranted. Admit to PCP service. Will benefit from evaluation by Dr Cindie Laroche in the AM.  Ct Head Wo Contrast  09/27/2013   CLINICAL DATA:  Weakness, dizziness, unresponsive, history diabetes, COPD, smoking  EXAM: CT HEAD WITHOUT CONTRAST  TECHNIQUE: Contiguous axial images were obtained from the base of the skull through the vertex without intravenous contrast.  COMPARISON:  12/27/2012  FINDINGS: Minimal generalized atrophy.  Normal ventricular morphology.  No midline shift or mass effect.  Normal appearance of brain parenchyma otherwise identified.  No intracranial hemorrhage, mass lesion, or evidence acute infarction.  No extra-axial fluid collections.  Few atherosclerotic calcifications in internal carotid arteries at skullbase.  Bones and sinuses unremarkable.  IMPRESSION: No acute intracranial abnormalities.   Electronically Signed   By: Lavonia Dana M.D.   On: 09/27/2013 22:24   Dg Chest Portable 1 View  09/27/2013   CLINICAL DATA:  Weakness and dizziness  EXAM: PORTABLE CHEST - 1 VIEW  COMPARISON:  01/13/2013  FINDINGS: Mild cardiomegaly, similar to prior. Diffuse interstitial coarsening, less severe than previously. No asymmetric opacity or visible effusion. No pneumothorax. Postsurgical changes to the distal left clavicle.  IMPRESSION: Mild pulmonary edema.   Electronically Signed   By: Jorje Guild M.D.   On: 09/27/2013 23:46   Results for orders placed during the hospital encounter of 09/27/13  CBC WITH DIFFERENTIAL      Result Value Range   WBC 8.5  4.0 - 10.5 K/uL   RBC 3.82 (*) 3.87 - 5.11 MIL/uL   Hemoglobin 11.9 (*) 12.0 - 15.0 g/dL   HCT 36.8  36.0 - 46.0 %   MCV 96.3  78.0 -  100.0 fL   MCH 31.2  26.0 - 34.0 pg   MCHC 32.3  30.0 - 36.0 g/dL   RDW 13.2  11.5 - 15.5 %   Platelets 169  150 - 400 K/uL   Neutrophils Relative % 48  43 - 77 %   Neutro Abs 4.1  1.7 - 7.7 K/uL   Lymphocytes Relative 42  12 - 46 %   Lymphs Abs 3.6  0.7 - 4.0 K/uL   Monocytes Relative 5  3 - 12 %   Monocytes Absolute 0.4  0.1 - 1.0 K/uL   Eosinophils Relative 5  0 - 5 %   Eosinophils Absolute 0.4  0.0 - 0.7 K/uL   Basophils Relative 0  0 - 1 %   Basophils Absolute 0.0  0.0 - 0.1 K/uL  BASIC METABOLIC PANEL      Result Value Range   Sodium 132 (*) 135 - 145 mEq/L   Potassium 4.5  3.5 - 5.1 mEq/L   Chloride 97  96 - 112 mEq/L   CO2 24  19 - 32 mEq/L   Glucose, Bld 111 (*) 70 - 99 mg/dL   BUN 18  6 - 23 mg/dL   Creatinine, Ser 1.64 (*) 0.50 - 1.10 mg/dL   Calcium 9.3  8.4 - 10.5 mg/dL   GFR calc non Af Amer 33 (*) >90 mL/min   GFR calc Af Amer 38 (*) >90 mL/min  URINALYSIS, ROUTINE W REFLEX MICROSCOPIC      Result Value Range  Color, Urine YELLOW  YELLOW   APPearance CLEAR  CLEAR   Specific Gravity, Urine 1.010  1.005 - 1.030   pH 6.0  5.0 - 8.0   Glucose, UA NEGATIVE  NEGATIVE mg/dL   Hgb urine dipstick MODERATE (*) NEGATIVE   Bilirubin Urine NEGATIVE  NEGATIVE   Ketones, ur NEGATIVE  NEGATIVE mg/dL   Protein, ur 100 (*) NEGATIVE mg/dL   Urobilinogen, UA 0.2  0.0 - 1.0 mg/dL   Nitrite NEGATIVE  NEGATIVE   Leukocytes, UA NEGATIVE  NEGATIVE  GLUCOSE, CAPILLARY      Result Value Range   Glucose-Capillary 106 (*) 70 - 99 mg/dL  URINE MICROSCOPIC-ADD ON      Result Value Range   WBC, UA 0-2  <3 WBC/hpf   RBC / HPF 0-2  <3 RBC/hpf   Bacteria, UA MANY (*) RARE  HEPATIC FUNCTION PANEL      Result Value Range   Total Protein 7.0  6.0 - 8.3 g/dL   Albumin 3.0 (*) 3.5 - 5.2 g/dL   AST 21  0 - 37 U/L   ALT 12  0 - 35 U/L   Alkaline Phosphatase 96  39 - 117 U/L   Total Bilirubin 0.3  0.3 - 1.2 mg/dL   Bilirubin, Direct 0.1  0.0 - 0.3 mg/dL   Indirect Bilirubin 0.2 (*) 0.3  - 0.9 mg/dL  URINE RAPID DRUG SCREEN (HOSP PERFORMED)      Result Value Range   Opiates NONE DETECTED  NONE DETECTED   Cocaine NONE DETECTED  NONE DETECTED   Benzodiazepines NONE DETECTED  NONE DETECTED   Amphetamines NONE DETECTED  NONE DETECTED   Tetrahydrocannabinol NONE DETECTED  NONE DETECTED   Barbiturates NONE DETECTED  NONE DETECTED  BLOOD GAS, ARTERIAL      Result Value Range   FIO2 21.00     Delivery systems ROOM AIR     pH, Arterial 7.371  7.350 - 7.450   pCO2 arterial 45.7 (*) 35.0 - 45.0 mmHg   pO2, Arterial 61.3 (*) 80.0 - 100.0 mmHg   Bicarbonate 25.8 (*) 20.0 - 24.0 mEq/L   TCO2 23.9  0 - 100 mmol/L   Acid-Base Excess 1.1  0.0 - 2.0 mmol/L   O2 Saturation 91.7     Patient temperature 37.0     Collection site LEFT RADIAL     Drawn by [id number]     Sample type ARTERIAL     Allens test (pass/fail) PASS  PASS  PRO B NATRIURETIC PEPTIDE      Result Value Range   Pro B Natriuretic peptide (BNP) 244.5 (*) 0 - 125 pg/mL  ETHANOL      Result Value Range   Alcohol, Ethyl (B) <11  0 - 11 mg/dL  TROPONIN I      Result Value Range   Troponin I <0.30  <0.30 ng/mL  AMMONIA      Result Value Range   Ammonia 18  11 - 60 umol/L   I personally reviewed the imaging tests through PACS system I reviewed available ER/hospitalization records through the [employer], MD 09/28/13 0131

## 2013-09-28 NOTE — H&P (Signed)
PCP:   Maricela Curet, MD   Chief Complaint:  weakness  HPI: 62 yo female sent in by husband with 911 for generalized weakness.  And some mild confusion.  Pt knows its 2014.  Was given some ativan for her tremor in ED and is still mildly sedated for that.  Unknown what her baseline is.  She has a chronic tremor, unknown if this is worse.  Her w/u in ED is unrevealing.  Asked to obs overnight, b/c her husband is intoxicated at home and really cannot provide any further history per ED staff.  Review of Systems:  Unobtainable  Past Medical History: Past Medical History  Diagnosis Date  . Diabetes mellitus   . COPD (chronic obstructive pulmonary disease)   . ASCVD (arteriosclerotic cardiovascular disease)     MI in 96 requiring BMS CX; DES to M1 in 2000;normal coronary angiography in 2004  . Hyperlipidemia   . Hypothyroidism   . Tobacco abuse   . Depression   . DVT (deep venous thrombosis)   . Nephrolithiasis 2006    stone extraction   . Amputation of hand, right     traumatic  . Allergic rhinitis   . Cholelithiasis   . GERD (gastroesophageal reflux disease)   . Low back pain   . Peripheral neuropathy   . Diabetes mellitus type I   . DDD (degenerative disc disease), lumbar   . Sciatic pain     right  . Tremor    Past Surgical History  Procedure Laterality Date  . Dilation and curettage of uterus  1974  . Partial hysterectomy  1978  . Total abdominal hysterectomy w/ bilateral salpingoophorectomy  2002  . Shoulder surgery      Left shoulder for RTC;left arm surgery '98/left hand surgery 2001  . Cholecystectomy    . Umbilical hernia repair  2008  . Colonoscopy  01/2009    nl repeat in 5  years  . Back surgery    . Hand amputation Right     traumatic    Medications: Prior to Admission medications   Medication Sig Start Date End Date Taking? Authorizing Provider  ARIPiprazole (ABILIFY) 5 MG tablet Take 1 tablet (5 mg total) by mouth daily. 08/24/13   Levonne Spiller, MD  aspirin 81 MG chewable tablet Chew 81 mg by mouth daily.    Historical Provider, MD  benztropine (COGENTIN) 1 MG tablet Take 1 tablet (1 mg total) by mouth at bedtime. 08/24/13   Levonne Spiller, MD  Cholecalciferol (VITAMIN D-3) 1000 UNITS CAPS Take 1 capsule by mouth daily.    Historical Provider, MD  clonazePAM [facility]) 0.5 MG tablet take1 tab bid 08/24/13   Levonne Spiller, MD  colesevelam [facility]) 625 MG tablet Take 625 mg by mouth daily.    Historical Provider, MD  DULoxetine (CYMBALTA) 20 MG capsule Take 2 capsules (40 mg total) by mouth 2 (two) times daily. 08/24/13   Levonne Spiller, MD  ferrous sulfate 325 (65 FE) MG tablet Take 325 mg by mouth 2 (two) times daily.    Historical Provider, MD  fish oil-omega-3 fatty acids 1000 MG capsule Take 2 g by mouth daily.      Historical Provider, MD  furosemide (LASIX) 20 MG tablet Take 1 tablet (20 mg total) by mouth daily. 06/05/13   Jani Gravel, MD  gabapentin (NEURONTIN) 300 MG capsule Take 300 mg by mouth daily. 09/19/13   Historical Provider, MD  levothyroxine (SYNTHROID, LEVOTHROID) 200 MCG tablet Take 400 mcg by  mouth daily.    Historical Provider, MD  lisinopril (PRINIVIL,ZESTRIL) 20 MG tablet Take 20 mg by mouth daily.      Historical Provider, MD  metFORMIN (GLUCOPHAGE) 500 MG tablet Take 500 mg by mouth 2 (two) times daily.    Historical Provider, MD  metoprolol tartrate (LOPRESSOR) 12.5 mg TABS tablet Take 0.5 tablets (12.5 mg total) by mouth 2 (two) times daily. 06/05/13   Jani Gravel, MD  naproxen (NAPROSYN) 500 MG tablet Take 500 mg by mouth 2 (two) times daily with a meal.    Historical Provider, MD  NEXIUM 40 MG capsule Take 40 mg by mouth daily. 08/21/13   Historical Provider, MD  niacin (NIASPAN) 1000 MG CR tablet Take 1,000 mg by mouth at bedtime.      Historical Provider, MD  nitroGLYCERIN (NITROSTAT) 0.4 MG SL tablet Place 0.4 mg under the tongue every 5 (five) minutes as needed. Chest pain    Historical Provider, MD   pravastatin (PRAVACHOL) 40 MG tablet Take 40 mg by mouth daily.      Historical Provider, MD  traMADol (ULTRAM) 50 MG tablet Take 50 mg by mouth 3 (three) times daily.    Historical Provider, MD  traZODone (DESYREL) 100 MG tablet Take 1 tablet (100 mg total) by mouth at bedtime. 08/24/13   Levonne Spiller, MD  vitamin C (ASCORBIC ACID) 500 MG tablet Take 500 mg by mouth 2 (two) times daily.    Historical Provider, MD    Allergies:   Allergies  Allergen Reactions  . Ciprofloxacin     REACTION: Rash  . Iohexol      Desc: CHEST TIGHTNESS,BRETHING PROBLEMS NEEDS PRE MEDS   . Neomycin-Bacitracin Zn-Polymyx     REACTION: Rash  . Penicillins     REACTION: Rash  . Povidone     REACTION: Rash    Social History:  reports that she has been smoking Cigarettes.  She has a 20 pack-year smoking history. She does not have any smokeless tobacco history on file. She reports that she does not drink alcohol or use illicit drugs.  Family History: Family History  Problem Relation Age of Onset  . Depression Mother   . Bipolar disorder Mother   . Dementia Mother   . Alcohol abuse Father     Physical Exam: Filed Vitals:   09/27/13 1953 09/27/13 2200 09/27/13 2354 09/27/13 2359  BP: 147/70 126/59  131/92  Pulse: 75 66    Temp: 98 F (36.7 C)     TempSrc: Oral     Resp: 18 11 15    Height: 5' 6.5" (1.689 m)     Weight: 69.4 kg (153 lb)     SpO2: 92%   97%   General appearance: alert, cooperative and no distress Head: Normocephalic, without obvious abnormality, atraumatic Eyes: negative Nose: Nares normal. Septum midline. Mucosa normal. No drainage or sinus tenderness. Neck: no JVD and supple, symmetrical, trachea midline Lungs: clear to auscultation bilaterally Heart: regular rate and rhythm, S1, S2 normal, no murmur, click, rub or gallop Abdomen: soft, non-tender; bowel sounds normal; no masses,  no organomegaly Extremities: extremities normal, atraumatic, no cyanosis or edema Pulses: 2+  and symmetric Skin: Skin color, texture, turgor normal. No rashes or lesions Neurologic: Grossly normal  Mildly sedated denies pain    Labs on Admission:   Recent Labs  09/27/13 1957  NA 132*  K 4.5  CL 97  CO2 24  GLUCOSE 111*  BUN 18  CREATININE 1.64*  CALCIUM 9.3  Recent Labs  09/27/13 1957  AST 21  ALT 12  ALKPHOS 96  BILITOT 0.3  PROT 7.0  ALBUMIN 3.0*    Recent Labs  09/27/13 1957  WBC 8.5  NEUTROABS 4.1  HGB 11.9*  HCT 36.8  MCV 96.3  PLT 169    Recent Labs  09/28/13 0024  TROPONINI <0.30   Radiological Exams on Admission: Ct Head Wo Contrast  09/27/2013   CLINICAL DATA:  Weakness, dizziness, unresponsive, history diabetes, COPD, smoking  EXAM: CT HEAD WITHOUT CONTRAST  TECHNIQUE: Contiguous axial images were obtained from the base of the skull through the vertex without intravenous contrast.  COMPARISON:  12/27/2012  FINDINGS: Minimal generalized atrophy.  Normal ventricular morphology.  No midline shift or mass effect.  Normal appearance of brain parenchyma otherwise identified.  No intracranial hemorrhage, mass lesion, or evidence acute infarction.  No extra-axial fluid collections.  Few atherosclerotic calcifications in internal carotid arteries at skullbase.  Bones and sinuses unremarkable.  IMPRESSION: No acute intracranial abnormalities.   Electronically Signed   By: Lavonia Dana M.D.   On: 09/27/2013 22:24   Dg Chest Portable 1 View  09/27/2013   CLINICAL DATA:  Weakness and dizziness  EXAM: PORTABLE CHEST - 1 VIEW  COMPARISON:  01/13/2013  FINDINGS: Mild cardiomegaly, similar to prior. Diffuse interstitial coarsening, less severe than previously. No asymmetric opacity or visible effusion. No pneumothorax. Postsurgical changes to the distal left clavicle.  IMPRESSION: Mild pulmonary edema.   Electronically Signed   By: Jorje Guild M.D.   On: 09/27/2013 23:46    Assessment/Plan  62 yo female with ams, report history of etoh  abuse  Principal Problem:   Altered mental status-  uds is neg as well as etoh level.  obs overnight nothing focal on neuro exam.  freq neuro cks in am.  ivf overnight.  Active Problems:   COPD   Insomnia due to mental disorder   ETOH abuse  pcp is dr Dub Mikes A 09/28/2013, 1:46 AM

## 2013-09-28 NOTE — ED Notes (Signed)
[name] ([place] FRIEND/NEIGHBOR)    [phone]

## 2013-09-29 LAB — GLUCOSE, CAPILLARY: Glucose-Capillary: 103 mg/dL — ABNORMAL HIGH (ref 70–99)

## 2013-09-29 LAB — VITAMIN B12: Vitamin B-12: 347 pg/mL (ref 211–911)

## 2013-09-29 LAB — URINE CULTURE: Culture: NO GROWTH

## 2013-09-29 LAB — FOLATE: Folate: >20 ng/mL

## 2013-09-29 LAB — RPR: RPR Ser Ql: NONREACTIVE

## 2013-09-29 LAB — TSH: TSH: 4.383 u[IU]/mL (ref 0.350–4.500)

## 2013-09-29 MED ORDER — CHOLECALCIFEROL 25 MCG (1000 UT) PO TABS: 1000.0000 [IU] | ORAL_TABLET | Freq: Every day | ORAL | Status: DC

## 2013-09-29 NOTE — Progress Notes (Signed)
[id number] 

## 2013-09-30 NOTE — Progress Notes (Signed)
[name], [name] NO.:  [id number]  MEDICAL RECORD NO.:  [id number]  LOCATION:                                 FACILITY:  PHYSICIAN:  Unk Lightning, MDDATE OF BIRTH:  1951-08-13  DATE OF PROCEDURE:  09/28/2013 DATE OF DISCHARGE:  09/29/2013                                PROGRESS NOTE   SUBJECTIVE:  A 62 year old white female who came with altered mental status last night, is on multiple sedating medicines, has been off most of her medicines, alert and oriented today, does not have much recall of what happened last night.  CT scan of head is negative, showing no acute event.  She is on thyroid medicines and gabapentin and 5 or 6 drugs which are sedating, 3 of these have been withheld, clonazepam, Neurontin, and Ativan.  PHYSICAL EXAMINATION:  VITAL SIGNS:  Blood pressure 128/96, temperature 98, pulse 68 and regular, respiratory rate is 20. LUNGS:  Clear.  No rales, wheeze, or rhonchi. HEART:  Regular rhythm.  No S3, S4.  DIAGNOSTIC DATA:  Echocardiogram reveals ejection fraction of 60-65%, although chest x-ray shows question of mild pulmonary edema.  No ischemic symptomatology noted.  LABORATORY DATA:  Renal function, creatinine bumped at 0.73, not certain why.  PLAN:  Right now is to hold these sedating medicines, get TSH and dementia panel, monitor a.c. and at bedtime glucoses, and make further recommendations as the database expands.     Unk Lightning, MD     RMD/MEDQ  D:  09/28/2013  T:  09/29/2013  Job:  [id number]

## 2013-10-02 NOTE — Discharge Summary (Signed)
[name], HOR NO.:  MEDICAL RECORD NO.:  [id number]  LOCATION:                                 FACILITY:  PHYSICIAN:  Unk Lightning, MDDATE OF BIRTH:  1951/03/22  DATE OF ADMISSION: DATE OF DISCHARGE:  LH                              DISCHARGE SUMMARY   The patient is a 62 year old, white female who has non-insulin-dependent diabetes, hypertension, hyperlipidemia, coronary artery disease, depression, anxiety, essential tremor, who dropped her to the hospital by her husband.  She states she had altered mental status.  CT scan of the head was negative on admission.  There were no focal neurologic deficits.  She was on multiple sedating medicines from several physicians and 3 or 4 of these were held during the course of her hospital stay.  She stayed in the hospital for approximately 48 hours, had neuro checks.  These were quite normal.  She had no angina, anginal pain.  She has good glycemic control.  She likewise had a TSH that was pending at the time of discharge summary.  She was hemodynamically stable.  No significant anemia or other metabolic abnormalities were noted, and it was elected to stop several of her medicines.  She was subsequently discharged on the following medicines: 1. Cholecalciferol 1000 units p.o. daily. 2. Folic acid 1 mg p.o. daily. 3. Multivitamin 1 p.o. daily. 4. Abilify 5 mg p.o. daily. 5. Aspirin 81 mg per day. 6. Welchol 625 mg 3 p.o. b.i.d. 7. Omega-3 fatty acids 1000 mg p.o. b.i.d. 8. Lasix 20 mg p.o. daily. 9. Synthroid 200 mcg 2 tablets that is 400 mcg p.o. daily. 10.Lisinopril 20 mg per day. 11.Melatonin 3 mg p.o. h.s. 12.Metformin 5 mg p.o. b.i.d. 13.Lopressor 12.5 mg p.o. b.i.d. 14.Sublingual nitroglycerin 0.4 spray p.r.n. 15.Pravachol 40 mg p.o. daily. 16.Tramadol 50 mg p.o. t.i.d. p.r.n. 17.Desyrel 100 mg p.o. h.s.  She was told to stop taking Klonopin, Cymbalta, Neurontin, and Cogentin. The  patient understands this clearly and will follow up my office in 1 week's time.  I will follow up on her TSH.     Unk Lightning, MD     RMD/MEDQ  D:  09/29/2013  T:  09/30/2013  Job:  [id number]

## 2013-10-30 ENCOUNTER — Inpatient Hospital Stay (HOSPITAL_COMMUNITY)
Admission: EM | Admit: 2013-10-30 | Discharge: 2013-11-03 | DRG: 683 | Disposition: A | Discharge status: Home / Self Care | Payer: Medicare HMO | Attending: Family Medicine | Admitting: Family Medicine

## 2013-10-30 ENCOUNTER — Encounter (HOSPITAL_COMMUNITY): Payer: Self-pay | Admitting: Emergency Medicine

## 2013-10-30 ENCOUNTER — Emergency Department (HOSPITAL_COMMUNITY): Payer: Medicare HMO

## 2013-10-30 DIAGNOSIS — I2510 Atherosclerotic heart disease of native coronary artery without angina pectoris: Secondary | ICD-10-CM | POA: Diagnosis present

## 2013-10-30 DIAGNOSIS — G609 Hereditary and idiopathic neuropathy, unspecified: Secondary | ICD-10-CM

## 2013-10-30 DIAGNOSIS — E872 Acidosis, unspecified: Secondary | ICD-10-CM | POA: Diagnosis present

## 2013-10-30 DIAGNOSIS — Z818 Family history of other mental and behavioral disorders: Secondary | ICD-10-CM

## 2013-10-30 DIAGNOSIS — E46 Unspecified protein-calorie malnutrition: Secondary | ICD-10-CM | POA: Diagnosis present

## 2013-10-30 DIAGNOSIS — N390 Urinary tract infection, site not specified: Secondary | ICD-10-CM | POA: Diagnosis present

## 2013-10-30 DIAGNOSIS — N179 Acute kidney failure, unspecified: Secondary | ICD-10-CM | POA: Diagnosis present

## 2013-10-30 DIAGNOSIS — M1990 Unspecified osteoarthritis, unspecified site: Secondary | ICD-10-CM | POA: Diagnosis present

## 2013-10-30 DIAGNOSIS — K219 Gastro-esophageal reflux disease without esophagitis: Secondary | ICD-10-CM | POA: Diagnosis present

## 2013-10-30 DIAGNOSIS — Z9861 Coronary angioplasty status: Secondary | ICD-10-CM

## 2013-10-30 DIAGNOSIS — F411 Generalized anxiety disorder: Secondary | ICD-10-CM

## 2013-10-30 DIAGNOSIS — E785 Hyperlipidemia, unspecified: Secondary | ICD-10-CM

## 2013-10-30 DIAGNOSIS — E119 Type 2 diabetes mellitus without complications: Secondary | ICD-10-CM | POA: Diagnosis present

## 2013-10-30 DIAGNOSIS — Z7982 Long term (current) use of aspirin: Secondary | ICD-10-CM

## 2013-10-30 DIAGNOSIS — R809 Proteinuria, unspecified: Secondary | ICD-10-CM | POA: Diagnosis present

## 2013-10-30 DIAGNOSIS — E039 Hypothyroidism, unspecified: Secondary | ICD-10-CM | POA: Diagnosis present

## 2013-10-30 DIAGNOSIS — F17200 Nicotine dependence, unspecified, uncomplicated: Secondary | ICD-10-CM | POA: Diagnosis present

## 2013-10-30 DIAGNOSIS — N189 Chronic kidney disease, unspecified: Secondary | ICD-10-CM | POA: Diagnosis present

## 2013-10-30 DIAGNOSIS — Z794 Long term (current) use of insulin: Secondary | ICD-10-CM

## 2013-10-30 DIAGNOSIS — A498 Other bacterial infections of unspecified site: Secondary | ICD-10-CM | POA: Diagnosis present

## 2013-10-30 DIAGNOSIS — F329 Major depressive disorder, single episode, unspecified: Secondary | ICD-10-CM | POA: Diagnosis present

## 2013-10-30 DIAGNOSIS — J4489 Other specified chronic obstructive pulmonary disease: Secondary | ICD-10-CM | POA: Diagnosis present

## 2013-10-30 DIAGNOSIS — F3289 Other specified depressive episodes: Secondary | ICD-10-CM | POA: Diagnosis present

## 2013-10-30 DIAGNOSIS — I129 Hypertensive chronic kidney disease with stage 1 through stage 4 chronic kidney disease, or unspecified chronic kidney disease: Secondary | ICD-10-CM | POA: Diagnosis present

## 2013-10-30 DIAGNOSIS — J449 Chronic obstructive pulmonary disease, unspecified: Secondary | ICD-10-CM | POA: Diagnosis present

## 2013-10-30 DIAGNOSIS — Z86718 Personal history of other venous thrombosis and embolism: Secondary | ICD-10-CM

## 2013-10-30 DIAGNOSIS — K589 Irritable bowel syndrome without diarrhea: Secondary | ICD-10-CM | POA: Diagnosis present

## 2013-10-30 DIAGNOSIS — N200 Calculus of kidney: Secondary | ICD-10-CM | POA: Diagnosis present

## 2013-10-30 DIAGNOSIS — I252 Old myocardial infarction: Secondary | ICD-10-CM

## 2013-10-30 DIAGNOSIS — I959 Hypotension, unspecified: Secondary | ICD-10-CM

## 2013-10-30 DIAGNOSIS — N170 Acute kidney failure with tubular necrosis: Principal | ICD-10-CM | POA: Diagnosis present

## 2013-10-30 DIAGNOSIS — E871 Hypo-osmolality and hyponatremia: Secondary | ICD-10-CM | POA: Diagnosis present

## 2013-10-30 LAB — GLUCOSE, CAPILLARY
Glucose-Capillary: 79 mg/dL (ref 70–99)
Glucose-Capillary: 104 mg/dL — ABNORMAL HIGH (ref 70–99)

## 2013-10-30 LAB — COMPREHENSIVE METABOLIC PANEL
ALBUMIN: 3.2 g/dL — AB (ref 3.5–5.2)
ALT: 17 U/L (ref 0–35)
AST: 13 U/L (ref 0–37)
Alkaline Phosphatase: 93 U/L (ref 39–117)
BUN: 79 mg/dL — AB (ref 6–23)
CALCIUM: 9.1 mg/dL (ref 8.4–10.5)
CHLORIDE: 102 meq/L (ref 96–112)
CO2: 11 mEq/L — ABNORMAL LOW (ref 19–32)
Creatinine, Ser: 4.52 mg/dL — ABNORMAL HIGH (ref 0.50–1.10)
GFR calc Af Amer: 11 mL/min — ABNORMAL LOW (ref >90)
GFR, EST NON AFRICAN AMERICAN: 10 mL/min — AB (ref >90)
Glucose, Bld: 101 mg/dL — ABNORMAL HIGH (ref 70–99)
Potassium: 4.2 mEq/L (ref 3.7–5.3)
Sodium: 134 mEq/L — ABNORMAL LOW (ref 137–147)
Total Bilirubin: 0.2 mg/dL — ABNORMAL LOW (ref 0.3–1.2)
Total Protein: 7.4 g/dL (ref 6.0–8.3)

## 2013-10-30 LAB — CBC WITH DIFFERENTIAL/PLATELET
BASOS ABS: 0 10*3/uL (ref 0.0–0.1)
Basophils Relative: 0 % (ref 0–1)
Eosinophils Absolute: 0.1 10*3/uL (ref 0.0–0.7)
Eosinophils Relative: 1 % (ref 0–5)
HEMATOCRIT: 37.6 % (ref 36.0–46.0)
Hemoglobin: 13.5 g/dL (ref 12.0–15.0)
LYMPHS ABS: 4 10*3/uL (ref 0.7–4.0)
Lymphocytes Relative: 33 % (ref 12–46)
MCH: 32.5 pg (ref 26.0–34.0)
MCHC: 35.9 g/dL (ref 30.0–36.0)
MCV: 90.4 fL (ref 78.0–100.0)
MONO ABS: 1 10*3/uL (ref 0.1–1.0)
Monocytes Relative: 8 % (ref 3–12)
NEUTROS ABS: 7 10*3/uL (ref 1.7–7.7)
Neutrophils Relative %: 58 % (ref 43–77)
Platelets: 201 10*3/uL (ref 150–400)
RBC: 4.16 MIL/uL (ref 3.87–5.11)
RDW: 13.9 % (ref 11.5–15.5)
WBC: 12.1 10*3/uL — ABNORMAL HIGH (ref 4.0–10.5)

## 2013-10-30 LAB — URINALYSIS W MICROSCOPIC + REFLEX CULTURE
Glucose, UA: NEGATIVE mg/dL
Ketones, ur: NEGATIVE mg/dL
NITRITE: NEGATIVE
PH: 5 (ref 5.0–8.0)
Protein, ur: >300 mg/dL — AB
Specific Gravity, Urine: >1.03 — ABNORMAL HIGH (ref 1.005–1.030)
UROBILINOGEN UA: 0.2 mg/dL (ref 0.0–1.0)

## 2013-10-30 LAB — TROPONIN I

## 2013-10-30 LAB — OCCULT BLOOD, POC DEVICE: FECAL OCCULT BLD: NEGATIVE

## 2013-10-30 LAB — LACTIC ACID, PLASMA: LACTIC ACID, VENOUS: 2 mmol/L (ref 0.5–2.2)

## 2013-10-30 LAB — LIPASE, BLOOD: Lipase: 23 U/L (ref 11–59)

## 2013-10-30 MED ORDER — ACETAMINOPHEN 325 MG PO TABS: 650.0000 mg | ORAL_TABLET | Freq: Four times a day (QID) | ORAL | Status: DC | PRN

## 2013-10-30 MED ORDER — PANTOPRAZOLE SODIUM 40 MG PO TBEC
40.0000 mg | DELAYED_RELEASE_TABLET | Freq: Every day | ORAL | Status: DC
Start: 2013-10-30 — End: 2013-10-30

## 2013-10-30 MED ORDER — GABAPENTIN 300 MG PO CAPS
300.0000 mg | ORAL_CAPSULE | Freq: Every day | ORAL | Status: DC
  Administered 2013-10-30 – 2013-11-02 (×4): 300 mg via ORAL
  Filled 2013-10-30 (×6): qty 1

## 2013-10-30 MED ORDER — LEVOTHYROXINE SODIUM 100 MCG PO TABS
200.0000 ug | ORAL_TABLET | Freq: Every day | ORAL | Status: DC
  Administered 2013-10-31 – 2013-11-03 (×4): 200 ug via ORAL
  Filled 2013-10-30 (×6): qty 2

## 2013-10-30 MED ORDER — ARIPIPRAZOLE 10 MG PO TABS
5.0000 mg | ORAL_TABLET | Freq: Every day | ORAL | Status: DC
  Administered 2013-10-31 – 2013-11-03 (×4): 5 mg via ORAL
  Filled 2013-10-30 (×6): qty 1

## 2013-10-30 MED ORDER — NITROGLYCERIN 0.4 MG/SPRAY TL SOLN
1.0000 | Status: DC | PRN
  Filled 2013-10-30: qty 4.9

## 2013-10-30 MED ORDER — DEXTROSE 5 % IV SOLN
INTRAVENOUS | Status: AC
  Filled 2013-10-30: qty 10

## 2013-10-30 MED ORDER — CLONAZEPAM 0.5 MG PO TABS
0.5000 mg | ORAL_TABLET | Freq: Every day | ORAL | Status: DC
  Administered 2013-10-31 – 2013-11-03 (×4): 0.5 mg via ORAL
  Filled 2013-10-30 (×4): qty 1

## 2013-10-30 MED ORDER — DEXTROSE 5 % IV SOLN
1.0000 g | INTRAVENOUS | Status: DC
  Administered 2013-10-31 – 2013-11-02 (×3): 1 g via INTRAVENOUS
  Filled 2013-10-30 (×4): qty 10

## 2013-10-30 MED ORDER — ACETAMINOPHEN 650 MG RE SUPP: 650.0000 mg | Freq: Four times a day (QID) | RECTAL | Status: DC | PRN

## 2013-10-30 MED ORDER — ONDANSETRON HCL 4 MG/2ML IJ SOLN: 4.0000 mg | Freq: Four times a day (QID) | INTRAMUSCULAR | Status: DC | PRN

## 2013-10-30 MED ORDER — SODIUM CHLORIDE 0.9 % IV BOLUS (SEPSIS)
500.0000 mL | Freq: Once | INTRAVENOUS | Status: AC
  Administered 2013-10-30: 500 mL via INTRAVENOUS

## 2013-10-30 MED ORDER — ONDANSETRON HCL 4 MG PO TABS: 4.0000 mg | ORAL_TABLET | Freq: Four times a day (QID) | ORAL | Status: DC | PRN

## 2013-10-30 MED ORDER — ALUM & MAG HYDROXIDE-SIMETH 200-200-20 MG/5ML PO SUSP: 30.0000 mL | Freq: Four times a day (QID) | ORAL | Status: DC | PRN

## 2013-10-30 MED ORDER — DEXTROSE 5 % IV SOLN
1.0000 g | Freq: Once | INTRAVENOUS | Status: AC
  Administered 2013-10-30: 1 g via INTRAVENOUS

## 2013-10-30 MED ORDER — SODIUM CHLORIDE 0.9 % IV SOLN
INTRAVENOUS | Status: DC
  Administered 2013-10-30: 15:00:00 via INTRAVENOUS

## 2013-10-30 MED ORDER — SODIUM CHLORIDE 0.9 % IV SOLN
INTRAVENOUS | Status: DC
  Administered 2013-10-30 – 2013-10-31 (×3): via INTRAVENOUS
  Administered 2013-11-01: 1000 mL via INTRAVENOUS
  Administered 2013-11-01 (×2): via INTRAVENOUS

## 2013-10-30 MED ORDER — PANTOPRAZOLE SODIUM 40 MG PO TBEC
80.0000 mg | DELAYED_RELEASE_TABLET | Freq: Every day | ORAL | Status: DC
  Administered 2013-10-31 – 2013-11-03 (×4): 80 mg via ORAL
  Filled 2013-10-30 (×4): qty 2

## 2013-10-30 MED ORDER — INSULIN ASPART 100 UNIT/ML ~~LOC~~ SOLN
0.0000 [IU] | Freq: Three times a day (TID) | SUBCUTANEOUS | Status: DC
  Administered 2013-10-31 – 2013-11-02 (×3): 1 [IU] via SUBCUTANEOUS

## 2013-10-30 MED ORDER — CLONAZEPAM 0.5 MG PO TABS: 0.5000 mg | ORAL_TABLET | Freq: Every day | ORAL | Status: DC

## 2013-10-30 MED ORDER — DULOXETINE HCL 20 MG PO CPEP
40.0000 mg | ORAL_CAPSULE | Freq: Two times a day (BID) | ORAL | Status: DC
  Administered 2013-10-31 – 2013-11-03 (×7): 40 mg via ORAL
  Filled 2013-10-30 (×9): qty 2

## 2013-10-30 MED ORDER — TRAZODONE HCL 50 MG PO TABS
100.0000 mg | ORAL_TABLET | Freq: Every day | ORAL | Status: DC
  Administered 2013-10-30 – 2013-11-02 (×4): 100 mg via ORAL
  Filled 2013-10-30 (×4): qty 2

## 2013-10-30 MED ORDER — INSULIN ASPART 100 UNIT/ML ~~LOC~~ SOLN: 0.0000 [IU] | Freq: Every day | SUBCUTANEOUS | Status: DC

## 2013-10-30 MED ORDER — BENZTROPINE MESYLATE 1 MG PO TABS
1.0000 mg | ORAL_TABLET | Freq: Every day | ORAL | Status: DC
  Administered 2013-10-30 – 2013-11-02 (×4): 1 mg via ORAL
  Filled 2013-10-30 (×4): qty 1

## 2013-10-30 MED ORDER — ENOXAPARIN SODIUM 30 MG/0.3ML ~~LOC~~ SOLN: 30.0000 mg | SUBCUTANEOUS | Status: DC

## 2013-10-30 MED ORDER — ENOXAPARIN SODIUM 30 MG/0.3ML ~~LOC~~ SOLN
30.0000 mg | SUBCUTANEOUS | Status: DC
  Administered 2013-10-30 – 2013-10-31 (×2): 30 mg via SUBCUTANEOUS
  Filled 2013-10-30 (×2): qty 0.3

## 2013-10-30 NOTE — ED Notes (Signed)
Per the doctor - acceptable for a clean catch urine verus in/out cath.

## 2013-10-30 NOTE — ED Provider Notes (Signed)
CSN: [id number]     Arrival date & time 10/30/13  1334 History   First MD Initiated Contact with Patient 10/30/13 1343     Chief Complaint  Patient presents with  . Abdominal Pain    HPI Pt was seen at 1405.  Per pt, c/o gradual onset and persistence of constant generalized abd "pain" for the past 2 weeks.  Has been associated with multiple intermittent episodes of N/V/D and subjective home fevers/chills.  Describes the abd pain as "cramping" and "sharp." Describes the diarrhea as "tan colored." States she was evaluated by her PMD yesterday for same. Denies recent abx use, no recent travel. Denies back pain, no rash, no CP/SOB, no black or blood in stools or emesis.       Past Medical History  Diagnosis Date  . Diabetes mellitus   . COPD (chronic obstructive pulmonary disease)   . ASCVD (arteriosclerotic cardiovascular disease)     MI in 96 requiring BMS CX; DES to M1 in 2000;normal coronary angiography in 2004  . Hyperlipidemia   . Hypothyroidism   . Tobacco abuse   . Depression   . DVT (deep venous thrombosis)   . Nephrolithiasis 2006    stone extraction   . Amputation of hand, right     traumatic  . Allergic rhinitis   . Cholelithiasis   . GERD (gastroesophageal reflux disease)   . Low back pain   . Peripheral neuropathy   . Diabetes mellitus type I   . DDD (degenerative disc disease), lumbar   . Sciatic pain     right  . Tremor    Past Surgical History  Procedure Laterality Date  . Dilation and curettage of uterus  1974  . Partial hysterectomy  1978  . Total abdominal hysterectomy w/ bilateral salpingoophorectomy  2002  . Shoulder surgery      Left shoulder for RTC;left arm surgery '98/left hand surgery 2001  . Cholecystectomy    . Umbilical hernia repair  2008  . Colonoscopy  01/2009    nl repeat in 5  years  . Back surgery    . Hand amputation Right     traumatic   Family History  Problem Relation Age of Onset  . Depression Mother   . Bipolar disorder  Mother   . Dementia Mother   . Alcohol abuse Father    History  Substance Use Topics  . Smoking status: Current Every Day Smoker -- 0.50 packs/day for 40 years    Types: Cigarettes  . Smokeless tobacco: Not on file  . Alcohol Use: No    Review of Systems ROS: Statement: All systems negative except as marked or noted in the HPI; Constitutional: Negative for subjective fever and +chills. ; ; Eyes: Negative for eye pain, redness and discharge. ; ; ENMT: Negative for ear pain, hoarseness, nasal congestion, sinus pressure and sore throat. ; ; Cardiovascular: Negative for chest pain, palpitations, diaphoresis, dyspnea and peripheral edema. ; ; Respiratory: Negative for cough, wheezing and stridor. ; ; Gastrointestinal: +abd pain, N/V/D. Negative for blood in stool, hematemesis, jaundice and rectal bleeding. . ; ; Genitourinary: Negative for dysuria, flank pain and hematuria. ; ; Musculoskeletal: Negative for back pain and neck pain. Negative for swelling and trauma.; ; Skin: Negative for pruritus, rash, abrasions, blisters, bruising and skin lesion.; ; Neuro: Negative for headache, lightheadedness and neck stiffness. Negative for weakness, altered level of consciousness , altered mental status, extremity weakness, paresthesias, involuntary movement, seizure and syncope.  Allergies  Ciprofloxacin; Iohexol; Neomycin-bacitracin zn-polymyx; Penicillins; and Povidone  Home Medications   Current Outpatient Rx  Name  Route  Sig  Dispense  Refill  . ARIPiprazole (ABILIFY) 5 MG tablet   Oral   Take 1 tablet (5 mg total) by mouth daily.   30 tablet   2   . aspirin 81 MG chewable tablet   Oral   Chew 81 mg by mouth daily.         . benztropine (COGENTIN) 1 MG tablet   Oral   Take 1 mg by mouth at bedtime.         . Cholecalciferol (VITAMIN D-3) 1000 UNITS CAPS   Oral   Take 1 capsule by mouth daily.         . cholecalciferol 1000 UNITS tablet   Oral   Take 1 tablet (1,000 Units  total) by mouth daily.   30 tablet   2   . clonazePAM (KLONOPIN) 0.5 MG tablet   Oral   Take 0.5 mg by mouth daily.         . colesevelam (WELCHOL) 625 MG tablet   Oral   Take 625 mg by mouth daily.         . DULoxetine (CYMBALTA) 20 MG capsule   Oral   Take 40 mg by mouth 2 (two) times daily.         Marland Kitchen esomeprazole (NEXIUM) 40 MG capsule   Oral   Take 40 mg by mouth daily at 12 noon.         . fish oil-omega-3 fatty acids 1000 MG capsule   Oral   Take 2 g by mouth daily.           . furosemide (LASIX) 20 MG tablet   Oral   Take 1 tablet (20 mg total) by mouth daily.   30 tablet   6   . gabapentin (NEURONTIN) 300 MG capsule   Oral   Take 300 mg by mouth at bedtime.         Marland Kitchen levothyroxine (SYNTHROID, LEVOTHROID) 200 MCG tablet   Oral   Take 200 mcg by mouth daily before breakfast.         . lisinopril (PRINIVIL,ZESTRIL) 20 MG tablet   Oral   Take 20 mg by mouth daily.           . Melatonin 3 MG TABS   Oral   Take 1 tablet by mouth at bedtime as needed (insomnia).         . metFORMIN (GLUCOPHAGE) 500 MG tablet   Oral   Take 500 mg by mouth 2 (two) times daily.         . metoprolol tartrate (LOPRESSOR) 12.5 mg TABS tablet   Oral   Take 0.5 tablets (12.5 mg total) by mouth 2 (two) times daily.   30 tablet   6   . niacin (NIASPAN) 1000 MG CR tablet   Oral   Take 1,000 mg by mouth at bedtime.         . nitroGLYCERIN (NITROLINGUAL) 0.4 MG/SPRAY spray   Sublingual   Place 1 spray under the tongue every 5 (five) minutes x 3 doses as needed for chest pain.         . pravastatin (PRAVACHOL) 40 MG tablet   Oral   Take 40 mg by mouth daily.           . traMADol (ULTRAM) 50 MG tablet   Oral  Take 50 mg by mouth 3 (three) times daily.         . traZODone (DESYREL) 100 MG tablet   Oral   Take 1 tablet (100 mg total) by mouth at bedtime.   30 tablet   2    BP 83/47  Pulse 73  Temp(Src) 97.6 F (36.4 C) (Oral)  Resp 19   Ht 5' 6"  (1.676 m)  Wt 153 lb (69.4 kg)  BMI 24.71 kg/m2  SpO2 97% Filed Vitals:   10/30/13 1416 10/30/13 1500 10/30/13 1700 10/30/13 1834  BP:  107/57 93/40 109/54  Pulse: 73   82  Temp:      TempSrc:      Resp: 19 16 21 24   Height:      Weight:      SpO2: 97%   94%    Physical Exam 1410: Physical examination:  Nursing notes reviewed; Vital signs and O2 SAT reviewed;  Constitutional: Well developed, Well nourished, In no acute distress; Head:  Normocephalic, atraumatic; Eyes: EOMI, PERRL, No scleral icterus; ENMT: Mouth and pharynx normal, Mucous membranes dry; Neck: Supple, Full range of motion, No lymphadenopathy; Cardiovascular: Regular rate and rhythm, No gallop; Respiratory: Breath sounds clear & equal bilaterally, No wheezes.  Speaking full sentences with ease, Normal respiratory effort/excursion; Chest: Nontender, Movement normal; Abdomen: Soft, +diffuse tenderness to palp. No rebound or guarding. Nondistended, Normal bowel sounds. Rectal exam performed w/permission of pt and ED RN chaperone present.  Anal tone normal.  Non-tender, soft brown stool in rectal vault, heme neg.  No fissures, no external hemorrhoids, no palp masses.; Genitourinary: No CVA tenderness; Extremities: Pulses normal, No tenderness, No edema, No calf edema or asymmetry.; Neuro: AA&Ox3, Major CN grossly intact.  Speech clear. No gross focal motor or sensory deficits in extremities.; Skin: Color normal, Warm, Dry.   ED Course  Procedures   EKG Interpretation    Date/Time:  Friday October 30 2013 14:16:06 EST Ventricular Rate:  75 PR Interval:  156 QRS Duration: 92 QT Interval:  404 QTC Calculation: 451 R Axis:   34 Text Interpretation:  Normal sinus rhythm Baseline wander Artifact Septal infarct , age undetermined T wave abnormality, consider lateral ischemia Abnormal ECG When compared with ECG of 27-Sep-2013 22:26, No significant change was found Confirmed by [facility]  MD, Nunzio Cory [phone]) on 10/30/2013  3:11:12 PM            MDM  MDM Reviewed: previous chart, nursing note and vitals Reviewed previous: labs and ECG Interpretation: labs, ECG, x-ray and CT scan Total time providing critical care: 30-74 minutes. This excludes time spent performing separately reportable procedures and services. Consults: admitting MD     CRITICAL CARE Performed by: Alfonzo Feller Total critical care time: 40 Critical care time was exclusive of separately billable procedures and treating other patients. Critical care was necessary to treat or prevent imminent or life-threatening deterioration. Critical care was time spent personally by me on the following activities: development of treatment plan with patient and/or surrogate as well as nursing, discussions with consultants, evaluation of patient's response to treatment, examination of patient, obtaining history from patient or surrogate, ordering and performing treatments and interventions, ordering and review of laboratory studies, ordering and review of radiographic studies, pulse oximetry and re-evaluation of patient's condition.  Results for orders placed during the hospital encounter of 10/30/13  URINALYSIS W MICROSCOPIC + REFLEX CULTURE      Result Value Range   Color, Urine AMBER (*) YELLOW   APPearance CLOUDY (*)  CLEAR   Specific Gravity, Urine >1.030 (*) 1.005 - 1.030   pH 5.0  5.0 - 8.0   Glucose, UA NEGATIVE  NEGATIVE mg/dL   Hgb urine dipstick SMALL (*) NEGATIVE   Bilirubin Urine SMALL (*) NEGATIVE   Ketones, ur NEGATIVE  NEGATIVE mg/dL   Protein, ur >300 (*) NEGATIVE mg/dL   Urobilinogen, UA 0.2  0.0 - 1.0 mg/dL   Nitrite NEGATIVE  NEGATIVE   Leukocytes, UA TRACE (*) NEGATIVE   WBC, UA 11-20  <3 WBC/hpf   RBC / HPF 3-6  <3 RBC/hpf   Bacteria, UA FEW (*) RARE   Squamous Epithelial / LPF FEW (*) RARE   Casts HYALINE CASTS (*) NEGATIVE  CBC WITH DIFFERENTIAL      Result Value Range   WBC 12.1 (*) 4.0 - 10.5 K/uL   RBC 4.16   3.87 - 5.11 MIL/uL   Hemoglobin 13.5  12.0 - 15.0 g/dL   HCT 37.6  36.0 - 46.0 %   MCV 90.4  78.0 - 100.0 fL   MCH 32.5  26.0 - 34.0 pg   MCHC 35.9  30.0 - 36.0 g/dL   RDW 13.9  11.5 - 15.5 %   Platelets 201  150 - 400 K/uL   Neutrophils Relative % 58  43 - 77 %   Lymphocytes Relative 33  12 - 46 %   Monocytes Relative 8  3 - 12 %   Eosinophils Relative 1  0 - 5 %   Basophils Relative 0  0 - 1 %   Neutro Abs 7.0  1.7 - 7.7 K/uL   Lymphs Abs 4.0  0.7 - 4.0 K/uL   Monocytes Absolute 1.0  0.1 - 1.0 K/uL   Eosinophils Absolute 0.1  0.0 - 0.7 K/uL   Basophils Absolute 0.0  0.0 - 0.1 K/uL   WBC Morphology ATYPICAL LYMPHOCYTES    COMPREHENSIVE METABOLIC PANEL      Result Value Range   Sodium 134 (*) 137 - 147 mEq/L   Potassium 4.2  3.7 - 5.3 mEq/L   Chloride 102  96 - 112 mEq/L   CO2 11 (*) 19 - 32 mEq/L   Glucose, Bld 101 (*) 70 - 99 mg/dL   BUN 79 (*) 6 - 23 mg/dL   Creatinine, Ser 4.52 (*) 0.50 - 1.10 mg/dL   Calcium 9.1  8.4 - 10.5 mg/dL   Total Protein 7.4  6.0 - 8.3 g/dL   Albumin 3.2 (*) 3.5 - 5.2 g/dL   AST 13  0 - 37 U/L   ALT 17  0 - 35 U/L   Alkaline Phosphatase 93  39 - 117 U/L   Total Bilirubin 0.2 (*) 0.3 - 1.2 mg/dL   GFR calc non Af Amer 10 (*) >90 mL/min   GFR calc Af Amer 11 (*) >90 mL/min  LIPASE, BLOOD      Result Value Range   Lipase 23  11 - 59 U/L  LACTIC ACID, PLASMA      Result Value Range   Lactic Acid, Venous 2.0  0.5 - 2.2 mmol/L  TROPONIN I      Result Value Range   Troponin I <0.30  <0.30 ng/mL  OCCULT BLOOD, POC DEVICE      Result Value Range   Fecal Occult Bld NEGATIVE  NEGATIVE   Ct Abdomen Pelvis Wo Contrast 10/30/2013   CLINICAL DATA:  Abdominal pain  EXAM: CT ABDOMEN AND PELVIS WITHOUT CONTRAST  TECHNIQUE: Multidetector CT imaging of  the abdomen and pelvis was performed following the standard protocol without intravenous contrast.  COMPARISON:  12/26/2012  FINDINGS: Mild vague ill-defined area of residual density projects within the  anterior aspect of the right lung base when compared to the previous study. There is prominence of the interstitial markings.  Noncontrast evaluation of the liver, spleen, adrenals, pancreas is unremarkable. Multiple bilateral nonobstructing medullary calculi appreciated appears stable. There is no evidence of hydronephrosis, hydroureter, nor a ureteral lithiasis. Patient is status postcholecystectomy.  There is no evidence of bowel obstruction, enteritis, colitis, diverticulitis, nor secondary signs reflecting appendicitis. The appendix is identified and is unremarkable.  There is no evidence of abdominal aortic aneurysm. Atherosclerotic calcifications identified within the abdominal aorta and iliac vessels. Patient is status post cholecystectomy.  Within the limitations of a noncontrast CT there is no evidence of abdominal or pelvic free fluid, loculated fluid collections, masses, nor adenopathy.  There is no evidence of abdominal wall nor inguinal hernia.  Multilevel degenerative changes identified within the thoracic and lumbar spine.  IMPRESSION: 1. Mild ill-defined area of increased density within the right lung base may represent an area of fibrosis considering the findings on previous CT dated 03/14 2014. Alternatively a region of persistent mild chronic infiltrate if clinically appropriate cannot be excluded. Mild to moderate interstitial changes identified within the lung bases likely representing areas of pulmonary fibrosis. 2. Stable bilateral nonobstructing medullary calculi. 3. There is otherwise no evidence of obstructive or inflammatory abnormalities within the limitations of a noncontrast CT. 4. Atherosclerotic calcifications within the aorta and mesenteric vessels.   Electronically Signed   By: Margaree Mackintosh M.D.   On: 10/30/2013 17:54   Dg Chest Port 1 View 10/30/2013   CLINICAL DATA:  Shortness of breath, abdominal pain, COPD  EXAM: PORTABLE CHEST - 1 VIEW  COMPARISON:  09/27/2013  FINDINGS:  The heart size and vascular pattern are normal. Is calcification of the aortic arch. There is mild diffuse interstitial prominence, unchanged from the prior study. There are no pleural effusions.  IMPRESSION: Stable mild interstitial prominence. This may represent a mild chronic inflammatory process. Mild pulmonary edema is not excluded, although this is felt to be less likely given the absence of cardiac enlargement or vascular congestion.   Electronically Signed   By: Skipper Cliche M.D.   On: 10/30/2013 14:50    1840:  No vomiting or stooling while in the ED. SBP 75/46  on ED arrival, multiple IVF boluses given with SBP increasing to 109/54.  Pt continues to mentate per her baseline, resps easy, remains afebrile. +UTI, UC pending; will start IV rocephin. T/C to Dr. Willey Blade, case discussed, including:  HPI, pertinent PM/SHx, VS/PE, dx testing, ED course and treatment:  Agreeable to admit.    Alfonzo Feller, DO 11/02/13 1215

## 2013-10-30 NOTE — Progress Notes (Signed)
ANTIBIOTIC CONSULT NOTE - INITIAL  Pharmacy Consult for Rocephin  Indication: UTI  Allergies  Allergen Reactions  . Ciprofloxacin     REACTION: Rash  . Iohexol      Desc: CHEST TIGHTNESS,BRETHING PROBLEMS NEEDS PRE MEDS   . Neomycin-Bacitracin Zn-Polymyx     REACTION: Rash  . Penicillins     REACTION: Rash  . Povidone     REACTION: Rash    Patient Measurements: Height: 5' 6"  (167.6 cm) Weight: 153 lb (69.4 kg) IBW/kg (Calculated) : 59.3  Vital Signs: Temp: 97.6 F (36.4 C) (01/16 1339) Temp src: Oral (01/16 1339) BP: 109/54 mmHg (01/16 1834) Pulse Rate: 82 (01/16 1834) Intake/Output from previous day:   Intake/Output from this shift:    Labs:  Recent Labs  10/30/13 1509  WBC 12.1*  HGB 13.5  PLT 201  CREATININE 4.52*   Estimated Creatinine Clearance: 12.1 ml/min (by C-G formula based on Cr of 4.52). No results found for this basename: VANCOTROUGH, VANCOPEAK, VANCORANDOM, GENTTROUGH, GENTPEAK, GENTRANDOM, TOBRATROUGH, TOBRAPEAK, TOBRARND, AMIKACINPEAK, AMIKACINTROU, AMIKACIN,  in the last 72 hours   Microbiology: No results found for this or any previous visit (from the past 720 hour(s)).  Medical History: Past Medical History  Diagnosis Date  . Diabetes mellitus   . COPD (chronic obstructive pulmonary disease)   . ASCVD (arteriosclerotic cardiovascular disease)     MI in 96 requiring BMS CX; DES to M1 in 2000;normal coronary angiography in 2004  . Hyperlipidemia   . Hypothyroidism   . Tobacco abuse   . Depression   . DVT (deep venous thrombosis)   . Nephrolithiasis 2006    stone extraction   . Amputation of hand, right     traumatic  . Allergic rhinitis   . Cholelithiasis   . GERD (gastroesophageal reflux disease)   . Low back pain   . Peripheral neuropathy   . Diabetes mellitus type I   . DDD (degenerative disc disease), lumbar   . Sciatic pain     right  . Tremor     Medications:  Scheduled:  . [START ON 10/31/2013] ARIPiprazole  5 mg  Oral Daily  . benztropine  1 mg Oral QHS  . clonazePAM  0.5 mg Oral Daily  . [START ON 10/31/2013] DULoxetine  40 mg Oral BID  . enoxaparin (LOVENOX) injection  30 mg Subcutaneous Q24H  . gabapentin  300 mg Oral QHS  . insulin aspart  0-5 Units Subcutaneous QHS  . [START ON 10/31/2013] insulin aspart  0-9 Units Subcutaneous TID WC  . [START ON 10/31/2013] levothyroxine  200 mcg Oral QAC breakfast  . pantoprazole  40 mg Oral Daily  . traZODone  100 mg Oral QHS   Assessment: 63 yo F admitted with abdominal cramping, N/V/D, and fever.  WBC is elevated.  Lactic acid is normal. Acute renal dysfunction noted.  No chronic renal or hepatic abnormalities documented.  Urine cx pending.   Rocephin 1/16>>  Goal of Therapy:  Eradicate infection.  Plan:  Rocephin 1gm IV q24h No dose adjustments anticipated.  Pharmacy to sign off.  Please re-consult an needed.   Biagio Borg 10/30/2013,7:29 PM

## 2013-10-30 NOTE — ED Notes (Signed)
Pt comes from home via EMS with c/o intermittent abdominal pain as well as intermittent n/v/d, SOB and fever x2 weeks. Pt was seen by Dr. Cindie Laroche yesterday but states symptoms have worsened.

## 2013-10-30 NOTE — ED Notes (Signed)
Pt resting w/ eyes closed, rise & fall of chest noted. Pt remains on cardiac monitor.

## 2013-10-31 LAB — CBC WITH DIFFERENTIAL/PLATELET
BASOS PCT: 1 % (ref 0–1)
Basophils Absolute: 0.1 10*3/uL (ref 0.0–0.1)
Eosinophils Absolute: 0.1 10*3/uL (ref 0.0–0.7)
Eosinophils Relative: 1 % (ref 0–5)
HCT: 32.7 % — ABNORMAL LOW (ref 36.0–46.0)
HEMOGLOBIN: 12 g/dL (ref 12.0–15.0)
LYMPHS ABS: 4.6 10*3/uL — AB (ref 0.7–4.0)
Lymphocytes Relative: 45 % (ref 12–46)
MCH: 32.6 pg (ref 26.0–34.0)
MCHC: 36.7 g/dL — AB (ref 30.0–36.0)
MCV: 88.9 fL (ref 78.0–100.0)
MONO ABS: 0.6 10*3/uL (ref 0.1–1.0)
Monocytes Relative: 6 % (ref 3–12)
Neutro Abs: 4.9 10*3/uL (ref 1.7–7.7)
Neutrophils Relative %: 47 % (ref 43–77)
PLATELETS: 174 10*3/uL (ref 150–400)
RBC: 3.68 MIL/uL — ABNORMAL LOW (ref 3.87–5.11)
RDW: 14.1 % (ref 11.5–15.5)
WBC: 10.3 10*3/uL (ref 4.0–10.5)

## 2013-10-31 LAB — GLUCOSE, CAPILLARY
GLUCOSE-CAPILLARY: 114 mg/dL — AB (ref 70–99)
Glucose-Capillary: 111 mg/dL — ABNORMAL HIGH (ref 70–99)
Glucose-Capillary: 137 mg/dL — ABNORMAL HIGH (ref 70–99)
Glucose-Capillary: 111 mg/dL — ABNORMAL HIGH (ref 70–99)

## 2013-10-31 LAB — BASIC METABOLIC PANEL
BUN: 66 mg/dL — ABNORMAL HIGH (ref 6–23)
CHLORIDE: 105 meq/L (ref 96–112)
CO2: 10 mEq/L — CL (ref 19–32)
Calcium: 8.5 mg/dL (ref 8.4–10.5)
Creatinine, Ser: 3.06 mg/dL — ABNORMAL HIGH (ref 0.50–1.10)
GFR calc non Af Amer: 15 mL/min — ABNORMAL LOW (ref >90)
GFR, EST AFRICAN AMERICAN: 18 mL/min — AB (ref >90)
Glucose, Bld: 112 mg/dL — ABNORMAL HIGH (ref 70–99)
POTASSIUM: 4.2 meq/L (ref 3.7–5.3)
Sodium: 132 mEq/L — ABNORMAL LOW (ref 137–147)

## 2013-10-31 LAB — TSH
TSH: 6.981 u[IU]/mL — ABNORMAL HIGH (ref 0.350–4.500)
TSH: 3.867 u[IU]/mL (ref 0.350–4.500)

## 2013-10-31 LAB — HEPATITIS C ANTIBODY: HCV AB: NEGATIVE

## 2013-10-31 LAB — LACTIC ACID, PLASMA: Lactic Acid, Venous: 0.6 mmol/L (ref 0.5–2.2)

## 2013-10-31 LAB — HEPATITIS B SURFACE ANTIGEN: Hepatitis B Surface Ag: NEGATIVE

## 2013-10-31 MED ORDER — JUVEN PO PACK
1.0000 | PACK | Freq: Three times a day (TID) | ORAL | Status: DC
  Administered 2013-10-31 – 2013-11-03 (×3): 1 via ORAL
  Filled 2013-10-31 (×12): qty 1

## 2013-10-31 MED ORDER — NITROGLYCERIN 0.4 MG SL SUBL: 0.4000 mg | SUBLINGUAL_TABLET | SUBLINGUAL | Status: DC | PRN

## 2013-10-31 NOTE — Progress Notes (Signed)
[name], [name] NO.:  [id number]  MEDICAL RECORD NO.:  [id number]  LOCATION:  A316                          FACILITY:  [facility]  PHYSICIAN:  Paula Compton. Willey Blade, MD       DATE OF BIRTH:  1950-12-17  DATE OF PROCEDURE:  10/31/2013 DATE OF DISCHARGE:                                PROGRESS NOTE   Ms. [name] states that she feels better today.  She denies any vomiting or diarrhea overnight.  She still has not given a stool specimen for evaluation.  She has received IV fluids without difficulty until IV access was lost this morning, multiple attempts have been unsuccessful restarting an IV.  She denies any abdominal pain now.  She has not had fever.  PHYSICAL EXAMINATION:  VITAL SIGNS:  Temperature 98.2, pulse 106, respirations 24, blood pressure 92/51. HEENT:  Pharynx unremarkable. LUNGS:  Clear. HEART:  Tachycardic, with no murmurs. ABDOMEN:  Soft, nondistended, and nontender with no organomegaly. EXTREMITIES:  No edema.  IMPRESSION/PLAN: 1. Dehydration.  BUN and creatinine are improved at 66 and 3.06.  She     remains acidotic with a bicarb of 10.  She remains hyponatremic     with a serum sodium at 132.  Her recent gastrointestinal symptoms     though seemed to be much improved overnight, it appears she will     require PICC line for adequate hydration. 2. UTI.  Continue ceftriaxone.  White count is down from 12.1 to 10.3.     Urine culture is pending. 3. Diabetes, stable, off metformin.  Glucose is 111. 4. Depression, stable on her combination of psychiatric medications. 5. Her appetite has improved.  She would like to advance to a bland     diet. 6. She has had another lactic acid level drawn which is normal at 0.6.     Paula Compton. Willey Blade, MD     ROF/MEDQ  D:  10/31/2013  T:  10/31/2013  Job:  [id number]

## 2013-10-31 NOTE — Progress Notes (Signed)
CRITICAL VALUE ALERT  Critical value received:  CO2 - 10 on BMET this morning.   Date of notification:  10/31/13  Time of notification:  0750  Critical value read back:yes  Nurse who received alert:  A. Stann Mainland, RN  MD notified (1st page):   Time of first page:   MD notified (2nd page):  Time of second page:  Responding MD:  Notified Dr Willey Blade on rounds this morning at [place]. No new orders received at this time. Janeece Fitting, RN  Time MD responded:

## 2013-10-31 NOTE — Progress Notes (Signed)
Lost IV access due to being infiltrated.  Attempted to get a new IV site by multiple nurses and was unsuccessful.  Pt. Stated she has had a PICC line in the past.  Is this a possibility due to poor IV access?

## 2013-10-31 NOTE — Progress Notes (Signed)
10/31/13 Dr Willey Blade notified at [place] this am, of patient loss of IV access and nursing staff unable to obtain new access with multiple attempts. Patient reported has required PICC line in the past per nursing staff report. Order received for PICC line to be inserted today if possible. Nursing supervisor notified, stated PICC nurse would be available today. On assessment, patient stated "they aren't putting a PICC line in, I've had them before and it hurts". Pt states they may attempt peripheral IV access again.  Awaiting PICC line nurse arrival to attempt IV access. Patient assisted up to chair, chair alarm on for safety. Instructed to call and not attempt getting up on her own, unsteady at times. States will call, call light within reach. Pt also educated on 24 hour urine collection as ordered per Dr. Lowanda Foster. Verbalized understanding of need to notify nursing staff of any urine output and none to be discarded. Donavan Foil, RN

## 2013-10-31 NOTE — Progress Notes (Signed)
Pt. Has tolerated clear liquids well throughout the night and is requesting to eat.  She stated she does not feel nauseated and has not have any episodes of vomiting or diarrhea.

## 2013-10-31 NOTE — Progress Notes (Signed)
INITIAL NUTRITION ASSESSMENT  DOCUMENTATION CODES Per approved criteria  -Not Applicable   INTERVENTION: Juven TID, each packet to provide additional 80 kcals daily. (Juven supplement does not contain povidone).   NUTRITION DIAGNOSIS: Inadequate oral intake related to decreased a[[etite as evidenced by PO less than 50%, significant wt loss.   Goal: Pt will meet >90% of estimated energy needs  Monitor:  PO intake, weight changes, labs, skin assessments, changes in status  Reason for Assessment: MST=5  63 y.o. female  Admitting Dx: <principal problem not specified>  ASSESSMENT: Pt admitted or abdominal pain, n/v, and fever x 2 weeks. Notes reports pt is feeling better and tolerated clear liquid diet this AM. Pt had a bland diet for lunch; noted less than 50% intake of tray in room.  Wt hx reveals a 24% wt loss x 1 year, 12% wt loss x 6 months, 18% wt loss x 3 months, and 8% wt loss x 1 month, all of which are clinically significant.  Unable to arouse pt at time of visit and no family present in room. Brief physical exam revealed no evidence of fat or muscle loss on face or clavicle areas.  Noted pt has povidone allergy, which is present in nutritional supplements. Previous RD assessment reveals that pt has had no adverse reactions to drinking nutritional supplements.  While pt does not meet criteria for malnutrition at this time, pt is at high risk for malnutrition given wt loss and poor po intake.   Height: Ht Readings from Last 1 Encounters:  10/30/13 5' 6"  (1.676 m)    Weight: Wt Readings from Last 1 Encounters:  10/31/13 141 lb 11.2 oz (64.275 kg)    Ideal Body Weight: 130#  % Ideal Body Weight: 108%  Wt Readings from Last 10 Encounters:  10/31/13 141 lb 11.2 oz (64.275 kg)  09/27/13 153 lb (69.4 kg)  08/24/13 156 lb (70.761 kg)  06/03/13 171 lb 8.3 oz (77.8 kg)  05/14/13 163 lb (73.936 kg)  01/05/13 172 lb 13.5 oz (78.4 kg)  10/02/12 186 lb 6.4 oz (84.55 kg)   09/02/12 188 lb (85.276 kg)  07/06/12 190 lb (86.183 kg)  05/06/12 190 lb (86.183 kg)    Usual Body Weight: 190#  % Usual Body Weight: 74%  BMI:  Body mass index is 22.88 kg/(m^2). Meets criteria for normal weight.   Estimated Nutritional Needs: Kcal: 1200-1300 daily Protein: 64-80 grams daily Fluid: 1.2-1.3 L daily  Skin: Intact  Diet Order: Criss Rosales  EDUCATION NEEDS: -Education not appropriate at this time   Intake/Output Summary (Last 24 hours) at 10/31/13 1353 Last data filed at 10/31/13 1233  Gross per 24 hour  Intake    480 ml  Output   1600 ml  Net  -1120 ml    Last BM: 10/30/13   Labs:   Recent Labs Lab 10/30/13 1509 10/31/13 0708  NA 134* 132*  K 4.2 4.2  CL 102 105  CO2 11* 10*  BUN 79* 66*  CREATININE 4.52* 3.06*  CALCIUM 9.1 8.5  GLUCOSE 101* 112*    CBG (last 3)   Recent Labs  10/30/13 2015 10/30/13 2322 10/31/13 0735  GLUCAP 79 104* 111*    Scheduled Meds: . ARIPiprazole  5 mg Oral Daily  . benztropine  1 mg Oral QHS  . cefTRIAXone (ROCEPHIN)  IV  1 g Intravenous Q24H  . clonazePAM  0.5 mg Oral Daily  . DULoxetine  40 mg Oral BID  . enoxaparin (LOVENOX) injection  30  mg Subcutaneous Q24H  . gabapentin  300 mg Oral QHS  . insulin aspart  0-5 Units Subcutaneous QHS  . insulin aspart  0-9 Units Subcutaneous TID WC  . levothyroxine  200 mcg Oral QAC breakfast  . pantoprazole  80 mg Oral Daily  . traZODone  100 mg Oral QHS    Continuous Infusions: . sodium chloride 150 mL/hr at 10/31/13 0413    Past Medical History  Diagnosis Date  . Diabetes mellitus   . COPD (chronic obstructive pulmonary disease)   . ASCVD (arteriosclerotic cardiovascular disease)     MI in 96 requiring BMS CX; DES to M1 in 2000;normal coronary angiography in 2004  . Hyperlipidemia   . Hypothyroidism   . Tobacco abuse   . Depression   . DVT (deep venous thrombosis)   . Nephrolithiasis 2006    stone extraction   . Amputation of hand, right      traumatic  . Allergic rhinitis   . Cholelithiasis   . GERD (gastroesophageal reflux disease)   . Low back pain   . Peripheral neuropathy   . Diabetes mellitus type I   . DDD (degenerative disc disease), lumbar   . Sciatic pain     right  . Tremor     Past Surgical History  Procedure Laterality Date  . Dilation and curettage of uterus  1974  . Partial hysterectomy  1978  . Total abdominal hysterectomy w/ bilateral salpingoophorectomy  2002  . Shoulder surgery      Left shoulder for RTC;left arm surgery '98/left hand surgery 2001  . Cholecystectomy    . Umbilical hernia repair  2008  . Colonoscopy  01/2009    nl repeat in 5  years  . Back surgery    . Hand amputation Right     traumatic    Edwyna Dangerfield A. Jimmye Norman, RD, LDN Pager: [phone]

## 2013-10-31 NOTE — Progress Notes (Signed)
10/31/13 1335 Patient up to chair, chair alarm in place for safety. Red non-slip socks in place. Call light within reach. Patient found lying on floor next to chair by nurse tech. When asked if patient called for assist before getting up, pt stated "no, it was my fault, I just got up on my own". No bruises, injuries noted. Denied pain. Stated "i didn't hit my head, held it up so it wouldn't hit the floor". Assisted back to chair. Chair alarm remains in place. Call light and phone within reach. Reinforced fall prevention/safety education, again instructed to call for assistance and not attempt getting up on her own. Patient stated "i know to call".  VSS. Neuro check within normal limits.  Notified Dr. Willey Blade. Stated okay, no new orders. Donavan Foil, RN

## 2013-10-31 NOTE — Progress Notes (Signed)
10/31/13 1836 Patient refused PICC line placed when PICC nurse arrived this evening. ER nursing staff able to obtained IV access this evening. NS at 125 ml/hour and rocephin given as ordered this evening once IV access obtained. Donavan Foil, RN

## 2013-10-31 NOTE — Progress Notes (Signed)
10/31/13 1129 Patient tolerated clear liquids well, no complaints of nausea, no vomiting or diarrhea. Requested "regular food, I'm starving". Notified Dr. Willey Blade. Order received for bland diet. Notified him PICC nurse to come this afternoon regarding IV access. Stated okay. Donavan Foil, RN

## 2013-10-31 NOTE — H&P (Signed)
[name], [name] NO.:  [id number]  MEDICAL RECORD NO.:  [id number]  LOCATION:  A316                          FACILITY:  [facility]  PHYSICIAN:  Paula Compton. Willey Blade, MD       DATE OF BIRTH:  [birth year]  DATE OF ADMISSION:  10/30/2013 DATE OF DISCHARGE:  LH                             HISTORY & PHYSICAL   CHIEF COMPLAINT:  Diarrhea.  HISTORY OF PRESENT ILLNESS:  This patient is a 63 year old, white female, patient of Dr. Cindie Laroche, who presented to the emergency room complaining of a 2-week course of repeated episodes of vomiting and diarrhea.  She had also had abdominal pain.  Her last bowel movement had been 1-day prior to presentation.  She denies fever.  She denies hematemesis, melena, or hematochezia.  She was evaluated in the emergency room and found to be Hemoccult negative.  She was found to be in acute renal failure likely related to marked dehydration with a BUN and creatinine of 79 and 4.52.  PAST MEDICAL HISTORY: 1. Diabetes. 2. COPD. 3. Coronary artery disease, status post stent. 4. Hypothyroidism. 5. Hyperlipidemia. 6. Depression. 7. DVT. 8. Kidney stones. 9. Traumatic right hand amputation. 10.Cholelithiasis. 11.GERD. 12.Low back pain. 13.Peripheral neuropathy. 14.Degenerative disc disease. 15.Tremor.  MEDICATIONS:  Abilify 5 mg daily, aspirin 81 mg daily, Cogentin 1 mg daily, vitamin D3 1000 units daily, Klonopin 0.5 mg daily, Welchol 625 mg daily, Cymbalta 40 mg b.i.d., Nexium 40 mg daily, fish oil 2 g daily, Lasix 20 mg daily, gabapentin 300 mg daily, Synthroid 200 mcg daily, lisinopril 20 mg daily, melatonin 3 mg daily p.r.n., metformin 500 mg b.i.d., metoprolol 12.5 mg b.i.d., niacin 1000 mg daily sublingual nitroglycerin p.r.n., pravastatin 40 mg daily, tramadol 50 mg t.i.d., trazodone 100 mg daily.  ALLERGIES:  Ciprofloxacin, iohexol, neomycin, bacitracin, polymyxin, penicillin, and povidone.  SOCIAL HISTORY:  She smokes about a 3rd  of a pack of cigarettes daily. She does not use alcohol.  She does not use recreational substances. She has not had recent travel.  FAMILY HISTORY:  Remarkable for bipolar disorder in her mother, dementia in her mother and alcohol abuse in her father.  REVIEW OF SYSTEMS:  No syncope, chest pain, difficulty breathing.  She has had some discomfort with voiding.  PHYSICAL EXAMINATION:  VITAL SIGNS:  Temperature 97.6, initially with a pulse of 73, and blood pressure 75/46, which improved to 1 0 9/54 with IV fluid, respirations 16, oxygen saturation 94%. GENERAL:  Alert, dehydrated-appearing female. HEENT:  Eyes revealed no scleral icterus.  Pharynx reveals dry mucous membranes. NECK:  Supple with no JVD or thyromegaly. LUNGS:  Clear. HEART:  Regular with no murmurs. ABDOMEN:  Active bowel sounds.  Soft, nontender.  No palpable organomegaly.  No CVA tenderness. EXTREMITIES:  No cyanosis, clubbing, or edema. NEURO:  No focal weakness. LYMPH NODES:  No cervical or supraclavicular enlargement  LABORATORY DATA:  White count 12.1, hemoglobin 13.5, platelets 201,000. Sodium 134, potassium 4.2, bicarb 11, BUN 79, creatinine 4.52, calcium 9.1, glucose 101, albumin 3.2.  Lactic acid 2.0.  Lipase 23, AST 13 ALT 17.  Urinalysis reveals 11-20 WBCs.  Greater than 300 mg/dL of protein.  She  underwent CT scan of the abdomen and pelvis, which revealed no inflammatory process.  There is no evidence of urinary tract obstruction.  She did have a right lung base density possibly representative of fibrosis.  She also had mild to moderate interstitial changes in the lung bases, felt to possibly represent pulmonary fibrosis.  Atherosclerotic calcifications were present in the aorta and mesenteric vessels.  There was stable bilateral nonobstructing medullary renal calculi.  The chest x-ray revealed stable mild interstitial prominence.  EKG reveals normal sinus rhythm at 75 beats per minute with possible  old septal infarct.  IMPRESSION/PLAN: 1. Acute renal failure, likely prerenal with recent gastroenteritis.     We will obtain stool studies.  We will treat with IV hydration. 2. Acidosis. 3. Malnutrition. 4. Urinary tract infection.  Obtain urine culture.  Treat with     ceftriaxone. 5. Diabetes.  Metformin will need to be held.  We will follow before     meals and at bedtime Accu-Cheks. 6. Coronary artery disease.  We will hold metoprolol, lisinopril, and     aspirin also. 7. Depression.  Continue Abilify, Cogentin, Cymbalta, and Klonopin. 8. Hypothyroidism.  Check TSH.     Paula Compton. Willey Blade, MD    ROF/MEDQ  D:  10/31/2013  T:  10/31/2013  Job:  [id number]

## 2013-10-31 NOTE — Consult Note (Signed)
Reason for Consult: Acute kidney injury Referring Physician: DR. Virgina Jock is an 63 y.o. female.  HPI: She is a patient was history of diabetes, hypertension, history of her kidney stone presently came with complaints of nausea, vomiting and diarrhea of 2 weeks duration. According to the patient today is watery and he doesn't have any blood. She usually goes 3-4 times a day. Patient denies any fever chills or sweating. Presently she's feeling much better. Patient denies any previous history of renal failure.  Past Medical History  Diagnosis Date  . Diabetes mellitus   . COPD (chronic obstructive pulmonary disease)   . ASCVD (arteriosclerotic cardiovascular disease)     MI in 96 requiring BMS CX; DES to M1 in 2000;normal coronary angiography in 2004  . Hyperlipidemia   . Hypothyroidism   . Tobacco abuse   . Depression   . DVT (deep venous thrombosis)   . Nephrolithiasis 2006    stone extraction   . Amputation of hand, right     traumatic  . Allergic rhinitis   . Cholelithiasis   . GERD (gastroesophageal reflux disease)   . Low back pain   . Peripheral neuropathy   . Diabetes mellitus type I   . DDD (degenerative disc disease), lumbar   . Sciatic pain     right  . Tremor     Past Surgical History  Procedure Laterality Date  . Dilation and curettage of uterus  1974  . Partial hysterectomy  1978  . Total abdominal hysterectomy w/ bilateral salpingoophorectomy  2002  . Shoulder surgery      Left shoulder for RTC;left arm surgery '98/left hand surgery 2001  . Cholecystectomy    . Umbilical hernia repair  2008  . Colonoscopy  01/2009    nl repeat in 5  years  . Back surgery    . Hand amputation Right     traumatic    Family History  Problem Relation Age of Onset  . Depression Mother   . Bipolar disorder Mother   . Dementia Mother   . Alcohol abuse Father     Social History:  reports that she has been smoking Cigarettes.  She has a 20 pack-year smoking  history. She does not have any smokeless tobacco history on file. She reports that she does not drink alcohol or use illicit drugs.  Allergies:  Allergies  Allergen Reactions  . Ciprofloxacin     REACTION: Rash  . Iohexol      Desc: CHEST TIGHTNESS,BRETHING PROBLEMS NEEDS PRE MEDS   . Neomycin-Bacitracin Zn-Polymyx     REACTION: Rash  . Penicillins     REACTION: Rash  . Povidone     REACTION: Rash    Medications: I have reviewed the patient's current medications.  Results for orders placed during the hospital encounter of 10/30/13 (from the past 48 hour(s))  URINALYSIS W MICROSCOPIC + REFLEX CULTURE     Status: Abnormal   Collection Time    10/30/13  2:54 PM      Result Value Range   Color, Urine AMBER (*) YELLOW   Comment: BIOCHEMICALS MAY BE AFFECTED BY COLOR   APPearance CLOUDY (*) CLEAR   Specific Gravity, Urine >1.030 (*) 1.005 - 1.030   pH 5.0  5.0 - 8.0   Glucose, UA NEGATIVE  NEGATIVE mg/dL   Hgb urine dipstick SMALL (*) NEGATIVE   Bilirubin Urine SMALL (*) NEGATIVE   Ketones, ur NEGATIVE  NEGATIVE mg/dL   Protein, ur >  300 (*) NEGATIVE mg/dL   Urobilinogen, UA 0.2  0.0 - 1.0 mg/dL   Nitrite NEGATIVE  NEGATIVE   Leukocytes, UA TRACE (*) NEGATIVE   WBC, UA 11-20  <3 WBC/hpf   RBC / HPF 3-6  <3 RBC/hpf   Bacteria, UA FEW (*) RARE   Squamous Epithelial / LPF FEW (*) RARE   Casts HYALINE CASTS (*) NEGATIVE  CBC WITH DIFFERENTIAL     Status: Abnormal   Collection Time    10/30/13  3:09 PM      Result Value Range   WBC 12.1 (*) 4.0 - 10.5 K/uL   RBC 4.16  3.87 - 5.11 MIL/uL   Hemoglobin 13.5  12.0 - 15.0 g/dL   HCT 37.6  36.0 - 46.0 %   MCV 90.4  78.0 - 100.0 fL   MCH 32.5  26.0 - 34.0 pg   MCHC 35.9  30.0 - 36.0 g/dL   RDW 13.9  11.5 - 15.5 %   Platelets 201  150 - 400 K/uL   Neutrophils Relative % 58  43 - 77 %   Lymphocytes Relative 33  12 - 46 %   Monocytes Relative 8  3 - 12 %   Eosinophils Relative 1  0 - 5 %   Basophils Relative 0  0 - 1 %   Neutro  Abs 7.0  1.7 - 7.7 K/uL   Lymphs Abs 4.0  0.7 - 4.0 K/uL   Monocytes Absolute 1.0  0.1 - 1.0 K/uL   Eosinophils Absolute 0.1  0.0 - 0.7 K/uL   Basophils Absolute 0.0  0.0 - 0.1 K/uL   WBC Morphology ATYPICAL LYMPHOCYTES    COMPREHENSIVE METABOLIC PANEL     Status: Abnormal   Collection Time    10/30/13  3:09 PM      Result Value Range   Sodium 134 (*) 137 - 147 mEq/L   Potassium 4.2  3.7 - 5.3 mEq/L   Chloride 102  96 - 112 mEq/L   CO2 11 (*) 19 - 32 mEq/L   Glucose, Bld 101 (*) 70 - 99 mg/dL   BUN 79 (*) 6 - 23 mg/dL   Creatinine, Ser 4.52 (*) 0.50 - 1.10 mg/dL   Calcium 9.1  8.4 - 10.5 mg/dL   Total Protein 7.4  6.0 - 8.3 g/dL   Albumin 3.2 (*) 3.5 - 5.2 g/dL   AST 13  0 - 37 U/L   ALT 17  0 - 35 U/L   Alkaline Phosphatase 93  39 - 117 U/L   Total Bilirubin 0.2 (*) 0.3 - 1.2 mg/dL   GFR calc non Af Amer 10 (*) >90 mL/min   GFR calc Af Amer 11 (*) >90 mL/min   Comment: (NOTE)     The eGFR has been calculated using the CKD EPI equation.     This calculation has not been validated in all clinical situations.     eGFR's persistently <90 mL/min signify possible Chronic Kidney     Disease.  LIPASE, BLOOD     Status: None   Collection Time    10/30/13  3:09 PM      Result Value Range   Lipase 23  11 - 59 U/L  LACTIC ACID, PLASMA     Status: None   Collection Time    10/30/13  3:09 PM      Result Value Range   Lactic Acid, Venous 2.0  0.5 - 2.2 mmol/L  TROPONIN I  Status: None   Collection Time    10/30/13  3:09 PM      Result Value Range   Troponin I <0.30  <0.30 ng/mL   Comment:            Due to the release kinetics of cTnI,     a negative result within the first hours     of the onset of symptoms does not rule out     myocardial infarction with certainty.     If myocardial infarction is still suspected,     repeat the test at appropriate intervals.  OCCULT BLOOD, POC DEVICE     Status: None   Collection Time    10/30/13  4:35 PM      Result Value Range    Fecal Occult Bld NEGATIVE  NEGATIVE  GLUCOSE, CAPILLARY     Status: None   Collection Time    10/30/13  8:15 PM      Result Value Range   Glucose-Capillary 79  70 - 99 mg/dL  GLUCOSE, CAPILLARY     Status: Abnormal   Collection Time    10/30/13 11:22 PM      Result Value Range   Glucose-Capillary 104 (*) 70 - 99 mg/dL  BASIC METABOLIC PANEL     Status: Abnormal   Collection Time    10/31/13  7:08 AM      Result Value Range   Sodium 132 (*) 137 - 147 mEq/L   Potassium 4.2  3.7 - 5.3 mEq/L   Chloride 105  96 - 112 mEq/L   CO2 10 (*) 19 - 32 mEq/L   Comment: CRITICAL RESULT CALLED TO, READ BACK BY AND VERIFIED WITH:     ROGERS L. AT [id number] ON [id number] BY THOMPSON S.   Glucose, Bld 112 (*) 70 - 99 mg/dL   BUN 66 (*) 6 - 23 mg/dL   Creatinine, Ser 3.06 (*) 0.50 - 1.10 mg/dL   Calcium 8.5  8.4 - 10.5 mg/dL   GFR calc non Af Amer 15 (*) >90 mL/min   GFR calc Af Amer 18 (*) >90 mL/min   Comment: (NOTE)     The eGFR has been calculated using the CKD EPI equation.     This calculation has not been validated in all clinical situations.     eGFR's persistently <90 mL/min signify possible Chronic Kidney     Disease.  CBC WITH DIFFERENTIAL     Status: Abnormal (Preliminary result)   Collection Time    10/31/13  7:08 AM      Result Value Range   WBC 10.3  4.0 - 10.5 K/uL   RBC 3.68 (*) 3.87 - 5.11 MIL/uL   Hemoglobin 12.0  12.0 - 15.0 g/dL   HCT 32.7 (*) 36.0 - 46.0 %   MCV 88.9  78.0 - 100.0 fL   MCH 32.6  26.0 - 34.0 pg   MCHC 36.7 (*) 30.0 - 36.0 g/dL   RDW 14.1  11.5 - 15.5 %   Platelets 174  150 - 400 K/uL   Neutrophils Relative % PENDING  43 - 77 %   Neutro Abs PENDING  1.7 - 7.7 K/uL   Band Neutrophils PENDING  0 - 10 %   Lymphocytes Relative PENDING  12 - 46 %   Lymphs Abs PENDING  0.7 - 4.0 K/uL   Monocytes Relative PENDING  3 - 12 %   Monocytes Absolute PENDING  0.1 - 1.0 K/uL   Eosinophils Relative  PENDING  0 - 5 %   Eosinophils Absolute PENDING  0.0 - 0.7 K/uL    Basophils Relative PENDING  0 - 1 %   Basophils Absolute PENDING  0.0 - 0.1 K/uL   WBC Morphology PENDING     RBC Morphology PENDING     Smear Review PENDING     nRBC PENDING  0 /100 WBC   Metamyelocytes Relative PENDING     Myelocytes PENDING     Promyelocytes Absolute PENDING     Blasts PENDING    LACTIC ACID, PLASMA     Status: None   Collection Time    10/31/13  7:08 AM      Result Value Range   Lactic Acid, Venous 0.6  0.5 - 2.2 mmol/L  GLUCOSE, CAPILLARY     Status: Abnormal   Collection Time    10/31/13  7:35 AM      Result Value Range   Glucose-Capillary 111 (*) 70 - 99 mg/dL   Comment 1 Documented in Chart     Comment 2 Notify RN      Ct Abdomen Pelvis Wo Contrast  10/30/2013   CLINICAL DATA:  Abdominal pain  EXAM: CT ABDOMEN AND PELVIS WITHOUT CONTRAST  TECHNIQUE: Multidetector CT imaging of the abdomen and pelvis was performed following the standard protocol without intravenous contrast.  COMPARISON:  12/26/2012  FINDINGS: Mild vague ill-defined area of residual density projects within the anterior aspect of the right lung base when compared to the previous study. There is prominence of the interstitial markings.  Noncontrast evaluation of the liver, spleen, adrenals, pancreas is unremarkable. Multiple bilateral nonobstructing medullary calculi appreciated appears stable. There is no evidence of hydronephrosis, hydroureter, nor a ureteral lithiasis. Patient is status postcholecystectomy.  There is no evidence of bowel obstruction, enteritis, colitis, diverticulitis, nor secondary signs reflecting appendicitis. The appendix is identified and is unremarkable.  There is no evidence of abdominal aortic aneurysm. Atherosclerotic calcifications identified within the abdominal aorta and iliac vessels. Patient is status post cholecystectomy.  Within the limitations of a noncontrast CT there is no evidence of abdominal or pelvic free fluid, loculated fluid collections, masses, nor  adenopathy.  There is no evidence of abdominal wall nor inguinal hernia.  Multilevel degenerative changes identified within the thoracic and lumbar spine.  IMPRESSION: 1. Mild ill-defined area of increased density within the right lung base may represent an area of fibrosis considering the findings on previous CT dated 03/14 2014. Alternatively a region of persistent mild chronic infiltrate if clinically appropriate cannot be excluded. Mild to moderate interstitial changes identified within the lung bases likely representing areas of pulmonary fibrosis. 2. Stable bilateral nonobstructing medullary calculi. 3. There is otherwise no evidence of obstructive or inflammatory abnormalities within the limitations of a noncontrast CT. 4. Atherosclerotic calcifications within the aorta and mesenteric vessels.   Electronically Signed   By: Margaree Mackintosh M.D.   On: 10/30/2013 17:54   Dg Chest Port 1 View  10/30/2013   CLINICAL DATA:  Shortness of breath, abdominal pain, COPD  EXAM: PORTABLE CHEST - 1 VIEW  COMPARISON:  09/27/2013  FINDINGS: The heart size and vascular pattern are normal. Is calcification of the aortic arch. There is mild diffuse interstitial prominence, unchanged from the prior study. There are no pleural effusions.  IMPRESSION: Stable mild interstitial prominence. This may represent a mild chronic inflammatory process. Mild pulmonary edema is not excluded, although this is felt to be less likely given the absence of cardiac enlargement or  vascular congestion.   Electronically Signed   By: Skipper Cliche M.D.   On: 10/30/2013 14:50    Review of Systems  Constitutional: Positive for chills. Negative for fever.  Respiratory: Negative for shortness of breath.   Gastrointestinal: Positive for nausea, vomiting and diarrhea.  Genitourinary: Positive for flank pain. Negative for urgency.  Neurological: Positive for weakness.   Blood pressure 92/51, pulse 106, temperature 98.2 F (36.8 C), temperature  source Oral, resp. rate 24, height 5' 6"  (1.676 m), weight 64.275 kg (141 lb 11.2 oz), SpO2 98.00%. Physical Exam  Constitutional: No distress.  Eyes: No scleral icterus.  Neck: No JVD present.  Cardiovascular: Normal rate and regular rhythm.   Respiratory: No respiratory distress. She has no wheezes.  GI: She exhibits no distension. There is no tenderness. There is no rebound.  Musculoskeletal: She exhibits no edema.    Assessment/Plan: Problem #1 acute kidney injury: Patient with history of nausea/vomiting/diarrhea and was hypotension most likely ATN versus prerenal syndrome. Her renal function seems to be worsening the last couple of weeks. Since patient has bilateral non-obstructing kidney stone underlying focal segmental nephrosclerosis cannot ruled out. Presently her renal function seems to be improving and patient is none oliguric. ACE inhibitor we'll continue to also to her renal failure. Problem #2 diabetes Problem #3 hypothyroidism Problem #4 hypertension: Her blood pressure seems to be somewhat low. Problem #5 diarrhea: Etiology as this moment not clear. Patient with history of irritable bowel syndrome. Problem #6 degenerative joint disease Problem #7 kidney stone: Bilateral and non-obstructing. Problem #8 atherosclerotic cardiovascular disease. Presently patient denies any chest pain. Problem #9 proteinuria: Seems to be nephrotic range. Has this moment this could be secondary to diabetes however other etiologies cannot ruled out. Plan: High but his hydration and will increase her IV fluid to 125 cc per hour. We'll check her basic metabolic panel, phosphorus in the morning. Check 24-hour urine for protein and immunoelectrophoresis. We'll check hepatitis B surface antigen, hepatitis C antibody ANA and complement. Tahiry Spicer S 10/31/2013, 8:48 AM

## 2013-11-01 ENCOUNTER — Inpatient Hospital Stay (HOSPITAL_COMMUNITY): Payer: Medicare HMO

## 2013-11-01 LAB — BASIC METABOLIC PANEL WITH GFR
BUN: 46 mg/dL — ABNORMAL HIGH (ref 6–23)
CO2: 14 meq/L — ABNORMAL LOW (ref 19–32)
Calcium: 8.3 mg/dL — ABNORMAL LOW (ref 8.4–10.5)
Chloride: 112 meq/L (ref 96–112)
Creatinine, Ser: 1.57 mg/dL — ABNORMAL HIGH (ref 0.50–1.10)
GFR calc Af Amer: 40 mL/min — ABNORMAL LOW
GFR calc non Af Amer: 34 mL/min — ABNORMAL LOW
Glucose, Bld: 106 mg/dL — ABNORMAL HIGH (ref 70–99)
Potassium: 4.2 meq/L (ref 3.7–5.3)
Sodium: 137 meq/L (ref 137–147)

## 2013-11-01 LAB — GLUCOSE, CAPILLARY
GLUCOSE-CAPILLARY: 112 mg/dL — AB (ref 70–99)
GLUCOSE-CAPILLARY: 88 mg/dL (ref 70–99)
Glucose-Capillary: 98 mg/dL (ref 70–99)
Glucose-Capillary: 137 mg/dL — ABNORMAL HIGH (ref 70–99)

## 2013-11-01 LAB — PHOSPHORUS: Phosphorus: 2.7 mg/dL (ref 2.3–4.6)

## 2013-11-01 LAB — CBC
HCT: 31.9 % — ABNORMAL LOW (ref 36.0–46.0)
Hemoglobin: 11.5 g/dL — ABNORMAL LOW (ref 12.0–15.0)
MCH: 32 pg (ref 26.0–34.0)
MCHC: 36.1 g/dL — ABNORMAL HIGH (ref 30.0–36.0)
MCV: 88.9 fL (ref 78.0–100.0)
Platelets: 149 10*3/uL — ABNORMAL LOW (ref 150–400)
RBC: 3.59 MIL/uL — ABNORMAL LOW (ref 3.87–5.11)
RDW: 14.2 % (ref 11.5–15.5)
WBC: 6.7 10*3/uL (ref 4.0–10.5)

## 2013-11-01 LAB — URINE CULTURE: Colony Count: 2000

## 2013-11-01 MED ORDER — CEFTRIAXONE SODIUM 1 G IJ SOLR
INTRAMUSCULAR | Status: AC
  Filled 2013-11-01: qty 10

## 2013-11-01 MED ORDER — ENOXAPARIN SODIUM 40 MG/0.4ML ~~LOC~~ SOLN
40.0000 mg | SUBCUTANEOUS | Status: DC
Start: 2013-11-01 — End: 2013-11-03
  Administered 2013-11-01 – 2013-11-02 (×2): 40 mg via SUBCUTANEOUS
  Filled 2013-11-01 (×2): qty 0.4

## 2013-11-01 NOTE — Progress Notes (Signed)
11/01/13 0852 Assisted up to chair, chair alarm on for safety. Red nonslip socks in place. Reinforced fall prevention/safety with patient. Instructed to call for assist and not attempt getting up on her own. Call light within reach. Patient demonstrates correct use of call light, states will call for assist and wait for nursing staff to help her up. Donavan Foil, RN

## 2013-11-01 NOTE — Progress Notes (Signed)
11/01/13 1429 Notified Dr. Willey Blade of c-spine x-ray results. Patient denies pain at this time, resting. bedalarm on for safety. Call light within reach. Safety sitter at bedside. Donavan Foil, RN

## 2013-11-01 NOTE — Progress Notes (Signed)
[name], CUMBO NO.:  [id number]  MEDICAL RECORD NO.:  [id number]  LOCATION:  A316                          FACILITY:  [facility]  PHYSICIAN:  Paula Compton. Willey Blade, MD       DATE OF BIRTH:  1951/03/03  DATE OF PROCEDURE: DATE OF DISCHARGE:                                PROGRESS NOTE   SUBJECTIVE:  [name] is feeling much better.  She denies any vomiting or diarrhea.  She denies any abdominal pain.  She has not had any fever. She is able to eat solid food.  OBJECTIVE:  VITAL SIGNS:  Temperature 98, pulse 88, respirations 20, blood pressure 130/66. LUNGS:  Clear. HEART:  Regular with no murmurs. ABDOMEN:  Soft and nontender. EXTREMITIES:  No edema.  IMPRESSION/PLAN: 1. Acute renal failure/dehydration improving with IV fluids.  BUN and     creatinine are down to 46 and 1.57.  Acidosis is improving with a     rise in her bicarb to 14.  She has been evaluated by Nephrology.     Her case was discussed with Nephrology. 2. Urinary tract infection.  Her culture reveals Escherichia coli with     75,000 colonies per mL.  Sensitivities are pending.  Continue     ceftriaxone.  Her white count is normal at 6.7.  Her hemoglobin is     trended down with hydration to 11.5, and her platelet count has     dropped from 201-149,000. 3. Diabetes.  Well controlled.  Her Accu-Chek this morning is 98.     Continue holding metformin. 4. Hypothyroidism.  She is euthyroid with a TSH of 3.86. 5. Fall.  She had a fall after I had left the room.  She tried to get     up on her own.  She complains of neck pain.  Cervical spine, films     will be obtained.  She has no sign of acute neurologic deficit.     Paula Compton. Willey Blade, MD     ROF/MEDQ  D:  11/01/2013  T:  11/01/2013  Job:  [id number]

## 2013-11-01 NOTE — Progress Notes (Signed)
11/01/13 1836 Patient stated "i'm ready to get up and ramble". Ambulated in hallway with nurse tech and nurse assist. Ambulated approximately 100 feet, tolerated well. Assisted back to bed, bedalarm on for safety. Call light within reach. Safety sitter at bedside. Donavan Foil, RN

## 2013-11-01 NOTE — Progress Notes (Signed)
11/01/13 1007 Nursing staff called to patient's room, patient found lying in floor by chair per dietary staff at [facility]. Chair alarm was in place, red socks in place. Patient has been frequently reminded to call for assist, not attempt getting up on her own. Verbalized understanding, demonstrates correct use of call light. Patient assisted back to bed, bedalarm on for safety. Small skin tear to right forearm noted on assessment, see flowsheet. Patient c/o pain to right side of neck. No bruises or other signs of injury noted. Dr. Willey Blade notified, came to room. Order received for c-spine x-ray. No further orders at this time. Neuro checks within normal limits. VSS. Foam dressing applied to skin tear. Patient's husband and son arrived at 52.  Notified husband and son of situation. Husband stated "she's okay". Reinforced fall prevention/safety instructions. Asked patient what to do when needing assistance. Patient stated "i call for help, push the button and wait for help". When asked if patient called for help before getting out of chair, pt stated "No, i heard someone in the hallway and called for them. I know I'm supposed to wait for help but I got up".  Bedalarm on for safety, call light within reach. Donavan Foil, RN

## 2013-11-01 NOTE — Progress Notes (Signed)
Subjective: Interval History: has no complaint of nausea or vomiting. Her diarrhea has improved and presently she denies any difficulty in breathing..  Objective: Vital signs in last 24 hours: Temp:  [97.5 F (36.4 C)-98.4 F (36.9 C)] 98.4 F (36.9 C) (01/18 0249) Pulse Rate:  [90-96] 90 (01/18 0249) Resp:  [20] 20 (01/18 0249) BP: (119-151)/(61-72) 119/61 mmHg (01/18 0249) SpO2:  [96 %-100 %] 96 % (01/18 0249) Weight change:   Intake/Output from previous day: 01/17 0701 - 01/18 0700 In: 50 [IV Piggyback:50] Out: 2850 [Urine:2850] Intake/Output this shift: Total I/O In: -  Out: 400 [Urine:400]  General appearance: alert, cooperative and no distress Resp: clear to auscultation bilaterally Cardio: regular rate and rhythm, S1, S2 normal, no murmur, click, rub or gallop GI: soft, non-tender; bowel sounds normal; no masses,  no organomegaly Extremities: extremities normal, atraumatic, no cyanosis or edema  Lab Results:  Recent Labs  10/31/13 0708 11/01/13 0642  WBC 10.3 6.7  HGB 12.0 11.5*  HCT 32.7* 31.9*  PLT 174 149*   BMET:  Recent Labs  10/31/13 0708 11/01/13 0642  NA 132* 137  K 4.2 4.2  CL 105 112  CO2 10* 14*  GLUCOSE 112* 106*  BUN 66* 46*  CREATININE 3.06* 1.57*  CALCIUM 8.5 8.3*   No results found for this basename: PTH,  in the last 72 hours Iron Studies: No results found for this basename: IRON, TIBC, TRANSFERRIN, FERRITIN,  in the last 72 hours  Studies/Results: Ct Abdomen Pelvis Wo Contrast  10/30/2013   CLINICAL DATA:  Abdominal pain  EXAM: CT ABDOMEN AND PELVIS WITHOUT CONTRAST  TECHNIQUE: Multidetector CT imaging of the abdomen and pelvis was performed following the standard protocol without intravenous contrast.  COMPARISON:  12/26/2012  FINDINGS: Mild vague ill-defined area of residual density projects within the anterior aspect of the right lung base when compared to the previous study. There is prominence of the interstitial markings.   Noncontrast evaluation of the liver, spleen, adrenals, pancreas is unremarkable. Multiple bilateral nonobstructing medullary calculi appreciated appears stable. There is no evidence of hydronephrosis, hydroureter, nor a ureteral lithiasis. Patient is status postcholecystectomy.  There is no evidence of bowel obstruction, enteritis, colitis, diverticulitis, nor secondary signs reflecting appendicitis. The appendix is identified and is unremarkable.  There is no evidence of abdominal aortic aneurysm. Atherosclerotic calcifications identified within the abdominal aorta and iliac vessels. Patient is status post cholecystectomy.  Within the limitations of a noncontrast CT there is no evidence of abdominal or pelvic free fluid, loculated fluid collections, masses, nor adenopathy.  There is no evidence of abdominal wall nor inguinal hernia.  Multilevel degenerative changes identified within the thoracic and lumbar spine.  IMPRESSION: 1. Mild ill-defined area of increased density within the right lung base may represent an area of fibrosis considering the findings on previous CT dated 03/14 2014. Alternatively a region of persistent mild chronic infiltrate if clinically appropriate cannot be excluded. Mild to moderate interstitial changes identified within the lung bases likely representing areas of pulmonary fibrosis. 2. Stable bilateral nonobstructing medullary calculi. 3. There is otherwise no evidence of obstructive or inflammatory abnormalities within the limitations of a noncontrast CT. 4. Atherosclerotic calcifications within the aorta and mesenteric vessels.   Electronically Signed   By: Margaree Mackintosh M.D.   On: 10/30/2013 17:54   Dg Chest Port 1 View  10/30/2013   CLINICAL DATA:  Shortness of breath, abdominal pain, COPD  EXAM: PORTABLE CHEST - 1 VIEW  COMPARISON:  09/27/2013  FINDINGS: The heart size and vascular pattern are normal. Is calcification of the aortic arch. There is mild diffuse interstitial  prominence, unchanged from the prior study. There are no pleural effusions.  IMPRESSION: Stable mild interstitial prominence. This may represent a mild chronic inflammatory process. Mild pulmonary edema is not excluded, although this is felt to be less likely given the absence of cardiac enlargement or vascular congestion.   Electronically Signed   By: Skipper Cliche M.D.   On: 10/30/2013 14:50    I have reviewed the patient'[name] current medications.  Assessment/Plan: Problem #1 acute kidney injury her BUN and creatinine presently improving. Etiology is thought to be secondary to prerenal syndrome/ATN/secondary to ACE inhibitor.  Problem #2 hypothyroidism Problem #3 history of diabetes Problem #4 diarrhea: Presently seems to be improving Problem #5 history of COPD Problem #6 metabolic acidosis: Most likely secondary to diarrhea. Her CO2 is 14 is improving. Problem #7 hyponatremia sodium has improved Problem #8 CAD Plan: We'll continue his present management We'll check her basic metabolic panel in the morning.   LOS: 2 days   [name] 11/01/2013,9:21 AM

## 2013-11-01 NOTE — Progress Notes (Signed)
11/01/13 1245 Safety sitter at bedside. bedalarm remains on for safety. Nursing staff continues to reinforce fall prevention/safety instructions. Pt continues to demonstrate correct use of call light. Donavan Foil, RN

## 2013-11-02 LAB — GLUCOSE, CAPILLARY
Glucose-Capillary: 88 mg/dL (ref 70–99)
Glucose-Capillary: 87 mg/dL (ref 70–99)
Glucose-Capillary: 144 mg/dL — ABNORMAL HIGH (ref 70–99)
Glucose-Capillary: 128 mg/dL — ABNORMAL HIGH (ref 70–99)

## 2013-11-02 LAB — URINE CULTURE: Colony Count: 75000

## 2013-11-02 LAB — BASIC METABOLIC PANEL
BUN: 23 mg/dL (ref 6–23)
CHLORIDE: 116 meq/L — AB (ref 96–112)
CO2: 15 mEq/L — ABNORMAL LOW (ref 19–32)
Calcium: 8.3 mg/dL — ABNORMAL LOW (ref 8.4–10.5)
Creatinine, Ser: 1.07 mg/dL (ref 0.50–1.10)
GFR calc non Af Amer: 54 mL/min — ABNORMAL LOW (ref >90)
GFR, EST AFRICAN AMERICAN: 63 mL/min — AB (ref >90)
Glucose, Bld: 119 mg/dL — ABNORMAL HIGH (ref 70–99)
POTASSIUM: 4 meq/L (ref 3.7–5.3)
SODIUM: 142 meq/L (ref 137–147)

## 2013-11-02 LAB — ANA: Anti Nuclear Antibody(ANA): NEGATIVE

## 2013-11-02 MED ORDER — SODIUM BICARBONATE 650 MG PO TABS
650.0000 mg | ORAL_TABLET | Freq: Two times a day (BID) | ORAL | Status: DC
  Administered 2013-11-02 – 2013-11-03 (×3): 650 mg via ORAL
  Filled 2013-11-02 (×3): qty 1

## 2013-11-02 NOTE — Progress Notes (Signed)
[id number] 

## 2013-11-02 NOTE — Progress Notes (Signed)
Utilization Review Complete  

## 2013-11-02 NOTE — Progress Notes (Signed)
Subjective: Interval History- Patient offers no complaint. Her diarrhea has resolved  Objective: Vital signs in last 24 hours: Temp:  [97 F (36.1 C)-98.2 F (36.8 C)] 98 F (36.7 C) (01/19 0514) Pulse Rate:  [66-89] 72 (01/19 0514) Resp:  [18-20] 18 (01/19 0514) BP: (112-149)/(52-78) 135/73 mmHg (01/19 0514) SpO2:  [98 %-100 %] 98 % (01/19 0514) Weight:  [66.089 kg (145 lb 11.2 oz)] 66.089 kg (145 lb 11.2 oz) (01/19 0500) Weight change:   Intake/Output from previous day: 01/18 0701 - 01/19 0700 In: 5260.4 [P.O.:2400; I.V.:2810.4; IV Piggyback:50] Out: 3300 [Urine:3300] Intake/Output this shift:    General appearance: alert, cooperative and no distress Resp: clear to auscultation bilaterally Cardio: regular rate and rhythm, S1, S2 normal, no murmur, click, rub or gallop GI: soft, non-tender; bowel sounds normal; no masses,  no organomegaly Extremities: extremities normal, atraumatic, no cyanosis or edema  Lab Results:  Recent Labs  10/31/13 0708 11/01/13 0642  WBC 10.3 6.7  HGB 12.0 11.5*  HCT 32.7* 31.9*  PLT 174 149*   BMET:   Recent Labs  11/01/13 0642 11/02/13 0519  NA 137 142  K 4.2 4.0  CL 112 116*  CO2 14* 15*  GLUCOSE 106* 119*  BUN 46* 23  CREATININE 1.57* 1.07  CALCIUM 8.3* 8.3*   No results found for this basename: PTH,  in the last 72 hours Iron Studies: No results found for this basename: IRON, TIBC, TRANSFERRIN, FERRITIN,  in the last 72 hours  Studies/Results: Dg Cervical Spine Complete  11/01/2013   CLINICAL DATA:  Fall with right-sided posterior neck pain.  EXAM: CERVICAL SPINE  4+ VIEWS  COMPARISON:  Cervical spine radiographs and CT 07/06/2012  FINDINGS: There is no evidence of listhesis, although C7-T1 is poorly visualized on lateral images due to overlapping soft tissues. Moderate lower cervical spondylosis is again seen. No acute fracture is identified. Prevertebral soft tissues are unremarkable. Visualized lung apices are clear.   IMPRESSION: No acute cervical spine fracture or listhesis identified. Suboptimal evaluation of C7-T1.   Electronically Signed   By: Logan Bores   On: 11/01/2013 13:25    I have reviewed the patient's current medications.  Assessment/Plan: Problem #1 acute kidney injury her BUN and creatinine has returned to her base line and her kidney function has recovered  Problem #2 hypothyroidism Problem #3 history of diabetes Problem #4 diarrhea: Has resolved Problem #5 history of COPD Problem #6 metabolic acidosis: Most likely secondary to diarrhea. Her CO2 is 15 is improving, but still remains low Problem #7 hyponatremia has corrected  Problem #8 CAD Plan: We'll D/C ivf Start on sodium bicarbonate 650 mg po bid We'll check her basic metabolic panel in the morning.   LOS: 3 days   Yakira Duquette S 11/02/2013,7:16 AM

## 2013-11-03 LAB — BASIC METABOLIC PANEL
BUN: 14 mg/dL (ref 6–23)
CHLORIDE: 113 meq/L — AB (ref 96–112)
CO2: 21 meq/L (ref 19–32)
Calcium: 8.5 mg/dL (ref 8.4–10.5)
Creatinine, Ser: 1.04 mg/dL (ref 0.50–1.10)
GFR calc Af Amer: 65 mL/min — ABNORMAL LOW (ref >90)
GFR calc non Af Amer: 56 mL/min — ABNORMAL LOW (ref >90)
Glucose, Bld: 107 mg/dL — ABNORMAL HIGH (ref 70–99)
Potassium: 4.1 mEq/L (ref 3.7–5.3)
Sodium: 144 mEq/L (ref 137–147)

## 2013-11-03 LAB — UIFE/LIGHT CHAINS/TP QN, 24-HR UR
Albumin, U: DETECTED
Alpha 1, Urine: DETECTED — AB
Alpha 2, Urine: DETECTED — AB
Beta, Urine: DETECTED — AB
Free Kappa Lt Chains,Ur: 14.1 mg/dL — ABNORMAL HIGH (ref 0.14–2.42)
Free Kappa/Lambda Ratio: 3.06 ratio (ref 2.04–10.37)
Free Lambda Excretion/Day: 145.22 mg/d
Free Lambda Lt Chains,Ur: 4.61 mg/dL — ABNORMAL HIGH (ref 0.02–0.67)
Free Lt Chn Excr Rate: 444.15 mg/d
Gamma Globulin, Urine: DETECTED — AB
Time: 24 h
Total Protein, Urine-Ur/day: 1562 mg/d — ABNORMAL HIGH (ref 10–140)
Total Protein, Urine: 49.6 mg/dL
Volume, Urine: 3150 mL

## 2013-11-03 LAB — C4 COMPLEMENT: Complement C4, Body Fluid: 33 mg/dL (ref 10–40)

## 2013-11-03 LAB — GLUCOSE, CAPILLARY
Glucose-Capillary: 109 mg/dL — ABNORMAL HIGH (ref 70–99)
Glucose-Capillary: 99 mg/dL (ref 70–99)

## 2013-11-03 LAB — C3 COMPLEMENT: C3 COMPLEMENT: 124 mg/dL (ref 90–180)

## 2013-11-03 LAB — COMPLEMENT, TOTAL: Compl, Total (CH50): 58 U/mL (ref 31–60)

## 2013-11-03 NOTE — Evaluation (Signed)
Physical Therapy Evaluation Patient Details Name: [name] MRN: [id number] DOB: 03/01/1951 Today's Date: 11/03/2013 Time: 8280-0349 PT Time Calculation (min): 24 min  PT Assessment / Plan / Recommendation History of Present Illness  Pt is admitted with acute renal failure following acute gastroenteritis.  She has a hx of COPD, DM and has had a traumatic amputation of the right hand.  Clinical Impression   Pt is found to be at prior functional level and is not found to have any gait instability.  The fall she sustained on the 16th is unexplained.  I did check her standing balance and found no abnormality.    PT Assessment  Patent does not need any further PT services    Follow Up Recommendations  No PT follow up    Does the patient have the potential to tolerate intense rehabilitation      Barriers to Discharge        Equipment Recommendations  None recommended by PT    Recommendations for Other Services     Frequency      Precautions / Restrictions Precautions Precautions: None Precaution Comments: pt did have a fall in the room 2 days ago but at this time her balance was found to be WNL Restrictions Weight Bearing Restrictions: No   Pertinent Vitals/Pain       Mobility  Bed Mobility Overal bed mobility: Independent Transfers Overall transfer level: Independent Ambulation/Gait Ambulation/Gait assistance: Independent Ambulation Distance (Feet): 300 Feet Assistive device: None Gait Pattern/deviations: WFL(Within Functional Limits) Gait velocity: WNL    Exercises     PT Diagnosis:    PT Problem List:   PT Treatment Interventions:       PT Goals(Current goals can be found in the care plan section) Acute Rehab PT Goals PT Goal Formulation: No goals set, d/c therapy  Visit Information  Last PT Received On: 11/03/13 History of Present Illness: Pt is admitted with acute renal failure following acute gastroenteritis.  She has a hx of COPD, DM and has  had a traumatic amputation of the right hand.       Prior [place] expects to be discharged to:: Private residence Living Arrangements: Spouse/significant other Available Help at Discharge: Family Type of Home: House Home Access: [place]: One [place]: Environmental consultant - 2 wheels;Cane - single point;Bedside commode Prior Function Level of Independence: Independent Communication Communication: No difficulties    Cognition  Cognition Arousal/Alertness: Awake/alert Behavior During Therapy: WFL for tasks assessed/performed Overall Cognitive Status: Within Functional Limits for tasks assessed    Extremity/Trunk Assessment Lower Extremity Assessment Lower Extremity Assessment: Overall WFL for tasks assessed (pt did have a right foot drop but this has resolved)   Balance Balance Overall balance assessment: Independent  End of Session PT - End of Session Equipment Utilized During Treatment: Gait belt Activity Tolerance: Patient tolerated treatment well Patient left: in bed;with call bell/phone within reach;with bed alarm set Nurse Communication: Mobility status  GP     Sable Feil 11/03/2013, 10:53 AM

## 2013-11-03 NOTE — Progress Notes (Signed)
[name]  MRN: [id number]  DOB/AGE: August 20, 1951 63 y.o.  Primary Care 52 M, MD  Admit date: 10/30/2013  Chief Complaint:  Chief Complaint  Patient presents with  . Abdominal Pain    S-Pt presented on  10/30/2013 with  Chief Complaint  Patient presents with  . Abdominal Pain  .    Pt today feels better   Pt looking forward to going home.  meds . ARIPiprazole  5 mg Oral Daily  . benztropine  1 mg Oral QHS  . cefTRIAXone (ROCEPHIN)  IV  1 g Intravenous Q24H  . clonazePAM  0.5 mg Oral Daily  . DULoxetine  40 mg Oral BID  . enoxaparin (LOVENOX) injection  40 mg Subcutaneous Q24H  . gabapentin  300 mg Oral QHS  . insulin aspart  0-5 Units Subcutaneous QHS  . insulin aspart  0-9 Units Subcutaneous TID WC  . levothyroxine  200 mcg Oral QAC breakfast  . nutrition supplement (JUVEN)  1 packet Oral TID WC  . pantoprazole  80 mg Oral Daily  . sodium bicarbonate  650 mg Oral BID  . traZODone  100 mg Oral QHS       Physical Exam: Vital signs in last 24 hours: Temp:  [98 F (36.7 C)-98.6 F (37 C)] 98 F (36.7 C) (01/20 0422) Pulse Rate:  [75-79] 77 (01/20 0422) Resp:  [18] 18 (01/20 0422) BP: (131-167)/(54-80) 167/80 mmHg (01/20 0422) SpO2:  [96 %-100 %] 96 % (01/20 0422) Weight:  [149 lb 11.1 oz (67.9 kg)] 149 lb 11.1 oz (67.9 kg) (01/20 0422) Weight change: 3 lb 15.9 oz (1.811 kg) Last BM Date: 10/31/13  Intake/Output from previous day: 01/19 0701 - 01/20 0700 In: 920 [P.O.:920] Out: 400 [Urine:400]     Physical Exam: General- pt is awake,alert, oriented to time place and person Resp- No acute REsp distress, CTA B/L NO Rhonchi CVS- S1S2 regular ij rate and rhythm GIT- BS+, soft, NT, ND EXT- NO LE Edema, Cyanosis   Lab Results: CBC  Recent Labs  11/01/13 0642  WBC 6.7  HGB 11.5*  HCT 31.9*  PLT 149*    BMET  Recent Labs  11/02/13 0519 11/03/13 0458  NA 142 144  K 4.0 4.1  CL 116* 113*  CO2 15* 21  GLUCOSE 119*  107*  BUN 23 14  CREATININE 1.07 1.04  CALCIUM 8.3* 8.5   Trend Creat 2015   4.52==>1.07=>1.04 2014   0.7--1.5  Trend CO2 11=>15==>21  MICRO Recent Results (from the past 240 hour(s))  URINE CULTURE     Status: None   Collection Time    10/30/13  2:54 PM      Result Value Range Status   Specimen Description URINE, CLEAN CATCH   Final   Special Requests NONE   Final   Culture  Setup Time     Final   Value: 10/31/2013 00:38     Performed at [facility]     Final   Value: 75,000 COLONIES/ML     Performed at [facility]   Culture     Final   Value: ESCHERICHIA COLI     Performed at [facility]   Report Status 11/02/2013 FINAL   Final   Organism ID, Bacteria ESCHERICHIA COLI   Final  URINE CULTURE     Status: None   Collection Time    10/31/13  4:41 AM      Result Value Range Status  Specimen Description URINE, CLEAN CATCH   Final   Special Requests NONE   Final   Culture  Setup Time     Final   Value: 10/31/2013 22:12     Performed at [facility]     Final   Value: 2,000 COLONIES/ML     Performed at [facility]   Culture     Final   Value: INSIGNIFICANT GROWTH     Performed at [facility]   Report Status 11/01/2013 FINAL   Final      Lab Results  Component Value Date   CALCIUM 8.5 11/03/2013   PHOS 2.7 11/01/2013        Impression: 1)Renal  AKI secondary to Prerenal/ATN                AKI sec tp Prerenal                AKI now much better                  2)HTN BP now better  Pt was mhypotensive earlier    3)Anemia HGb at goal (9--11)   4)GI- admitted with Nausea/Vomiting/Diarrhea Now better  5)Resp- hx of COPD Primary MD following  6)Electrolytes Normokalemic NOrmonatremic  7)Acid base Co2 was NOT at goal NON AG acidosis sec to GI losses. CO2 11=>15==>21 Now at goal    Plan:  Will continue current care     [place] S 11/03/2013, 9:20  AM

## 2013-11-03 NOTE — Care Management Note (Signed)
    Page 1 of 1   11/03/2013     11:48:13 AM   CARE MANAGEMENT NOTE 11/03/2013  Patient:  BERGEN, MELLE   Account Number:  [id number]  Date Initiated:  11/03/2013  Documentation initiated by:  Claretha Cooper  Subjective/Objective Assessment:   Pt from home and will return home. No HH or DME identified.     Action/Plan:   Anticipated DC Date:  11/03/2013   Anticipated DC Plan:  [place]  CM consult      Choice offered to / List presented to:             Status of service:  Completed, signed off Medicare Important Message given?  YES (If response is "NO", the following Medicare IM given date fields will be blank) Date Medicare IM given:  11/03/2013 Date Additional Medicare IM given:    Discharge Disposition:  HOME/SELF CARE  Per UR Regulation:    If discussed at Long Length of Stay Meetings, dates discussed:    Comments:  11/03/13 Claretha Cooper RN BSN CM

## 2013-11-03 NOTE — Progress Notes (Signed)
Patient being d/c home with instructions. IV cath removed and intact. No pain/swelling at site. Patient transported home by [facility]. Verbalizes understanding.

## 2013-11-03 NOTE — Discharge Summary (Signed)
[id number] 

## 2013-11-03 NOTE — Progress Notes (Signed)
NAMEYALITZA, TEED NO.:  [id number]  MEDICAL RECORD NO.:  [id number]  LOCATION:                                 FACILITY:  PHYSICIAN:  Unk Lightning, MDDATE OF BIRTH:  December 14, 1950  DATE OF PROCEDURE:  11/02/2013 DATE OF DISCHARGE:                                PROGRESS NOTE   HISTORY OF PRESENT ILLNESS:  The patient has acute tubular necrosis due to prerenal azotemia, insulin-dependent diabetes, hypertension, hyperlipidemia, depression, hypothyroidism, COPD, and metabolic alkalosis, CO2 is now 15, improving upwards from 11 possibly due to diarrhea, coronary artery disease.  The patient was started on sodium bicarbonate today 650 p.o. b.i.d.  PHYSICAL EXAMINATION:  VITAL SIGNS:  Blood pressure well controlled 131/54, temperature 98.2, pulse 75 and regular, CO2 of 15. LUNGS:  Clear with prolonged expiratory phase.  Scattered rhonchi.  No rales. HEART:  Regular rhythm.  No murmurs, gallops, heaves, thrills, or rubs. ABDOMEN:  Soft, nontender.  LABORATORY DATA:  Creatinine down to 1.07  PLAN:  Right now is to continue sodium bicarb 650 p.o. b.i.d., obtain good glycemic control and hemodynamic control.  Check BMET in a.m.     Unk Lightning, MD     RMD/MEDQ  D:  11/02/2013  T:  11/03/2013  Job:  [id number]

## 2013-11-04 NOTE — Discharge Summary (Signed)
NAMESAYDIE, GERDTS NO.:  [id number]  MEDICAL RECORD NO.:  [id number]  LOCATION:  A316                          FACILITY:  [facility]  PHYSICIAN:  Unk Lightning, MDDATE OF BIRTH:  July 07, 1951  DATE OF ADMISSION:  10/30/2013 DATE OF DISCHARGE:  01/20/2015LH                              DISCHARGE SUMMARY   The patient has type 2 diabetes, hypertension, depression, COPD, peripheral neuropathy, hyperlipidemia, anxiety, insomnia, hyperlipidemia as well as osteoporosis.  The patient was admitted with prerenal azotemia and acute tubular necrosis, with a creatinine on day of admission to 4.9.  She was given aggressive fluid resuscitation.  She likewise had a metabolic acidosis.  With aggressive fluid resuscitation, her BUN and creatinine returned to normal at 1.2.  Her acidosis resolved.  She was hemodynamically stable throughout her hospital stay. Blood pressure was well controlled.  Her lisinopril was stopped during inpatient stay.  Seen in consultation by Nephrology.  She had good glycemic control.  Her thyroid status with some episodes of chronic noncompliance, and revealed a mildly elevated TSH.  She was discharged on 200 mcg per day of Synthroid as well as the following listed medicines:  Pravachol 40 mg p.o. daily, tramadol 50 mg p.o. t.i.d., trazodone 100 mg p.o. at bedtime, melatonin 3 mg p.o. at bedtime, Glucophage 500 mg p.o. b.i.d., Neurontin 300 mg p.o. t.i.d., Cymbalta 30 mg p.o. daily, Welchol 6.25, three tablets b.i.d., clonazepam 0.5 mg p.o. at bedtime, Cogentin 1 mg p.o. at bedtime, aspirin 81 mg per day, Abilify 5 mL p.o. daily.  Her Lasix was discontinued and lisinopril was kept at 20 mg per day.  She will follow up in the office within 1 week's time to check electrolytes and renal function.     Unk Lightning, MD     RMD/MEDQ  D:  11/03/2013  T:  11/04/2013  Job:  [id number]

## 2013-11-21 ENCOUNTER — Other Ambulatory Visit: Payer: Self-pay | Admitting: Internal Medicine

## 2013-11-24 ENCOUNTER — Ambulatory Visit (HOSPITAL_COMMUNITY): Payer: Self-pay | Admitting: Psychiatry

## 2013-11-30 ENCOUNTER — Telehealth (HOSPITAL_COMMUNITY): Payer: Self-pay | Admitting: *Deleted

## 2013-12-02 ENCOUNTER — Other Ambulatory Visit (HOSPITAL_COMMUNITY): Payer: Self-pay | Admitting: Psychiatry

## 2013-12-02 NOTE — Telephone Encounter (Signed)
Dose changed to cymbalta 60 mg qam. Pt needs to come in

## 2013-12-03 ENCOUNTER — Ambulatory Visit (INDEPENDENT_AMBULATORY_CARE_PROVIDER_SITE_OTHER): Payer: Medicare HMO | Admitting: Psychiatry

## 2013-12-03 ENCOUNTER — Encounter (HOSPITAL_COMMUNITY): Payer: Self-pay | Admitting: Psychiatry

## 2013-12-03 VITALS — BP 150/80 | Ht 66.0 in | Wt 159.0 lb

## 2013-12-03 DIAGNOSIS — F319 Bipolar disorder, unspecified: Secondary | ICD-10-CM

## 2013-12-03 DIAGNOSIS — F329 Major depressive disorder, single episode, unspecified: Secondary | ICD-10-CM

## 2013-12-03 DIAGNOSIS — F3289 Other specified depressive episodes: Secondary | ICD-10-CM

## 2013-12-03 DIAGNOSIS — F3130 Bipolar disorder, current episode depressed, mild or moderate severity, unspecified: Secondary | ICD-10-CM

## 2013-12-03 MED ORDER — DULOXETINE HCL 60 MG PO CPEP: 60.0000 mg | ORAL_CAPSULE | Freq: Every day | ORAL | Status: DC

## 2013-12-03 MED ORDER — ARIPIPRAZOLE 5 MG PO TABS: 5.0000 mg | ORAL_TABLET | Freq: Every day | ORAL | Status: DC

## 2013-12-03 MED ORDER — TRAZODONE HCL 100 MG PO TABS: 100.0000 mg | ORAL_TABLET | Freq: Every day | ORAL | Status: DC
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
## 2013-12-07 ENCOUNTER — Other Ambulatory Visit: Payer: Self-pay | Admitting: Internal Medicine

## 2013-12-16 ENCOUNTER — Encounter: Payer: Self-pay | Admitting: Internal Medicine

## 2013-12-17 IMAGING — US US CAROTID DUPLEX BILAT
1 series · 13 of 24 positions shown · non-contrast
Comparison: 10/18/2006

CLINICAL DATA: Gait abnormality

BILATERAL CAROTID DUPLEX ULTRASOUND
TECHNIQUE: Gray scale imaging, color Doppler and duplex ultrasound
was performed of bilateral carotid and vertebral arteries in the
neck.

[Series 1: us carotid duplex bilat · 0.07mm/px · 13 of 69 slices shown]
[im 1/69]
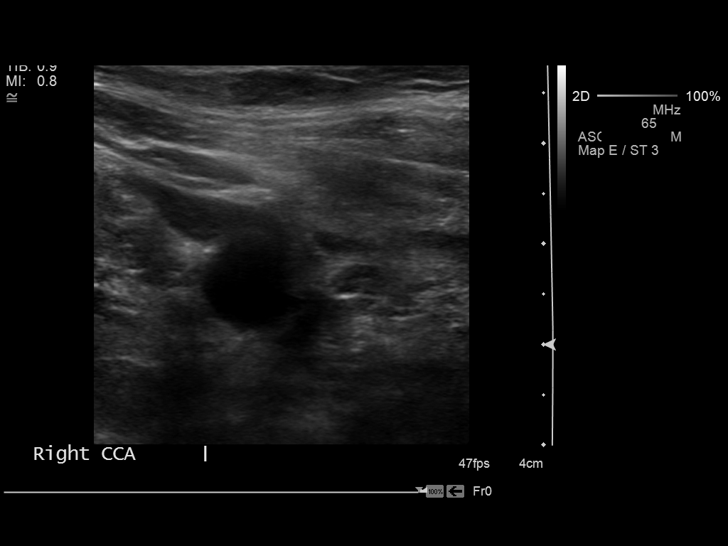
[im 6/69]
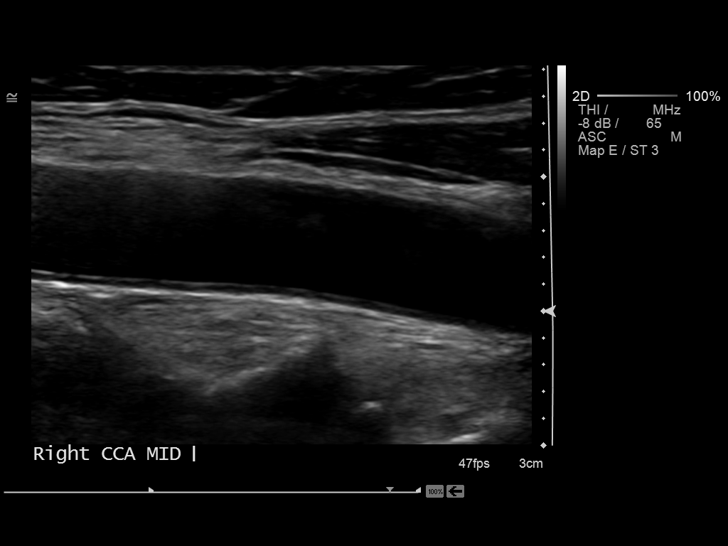
[im 12/69]
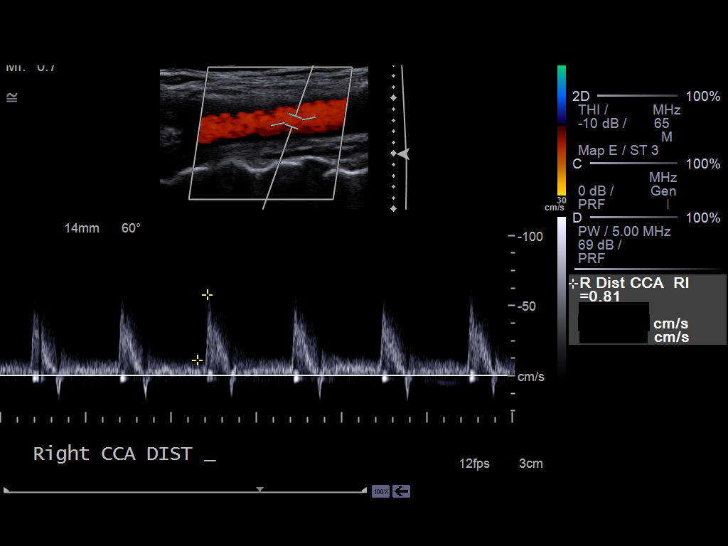
[im 18/69]
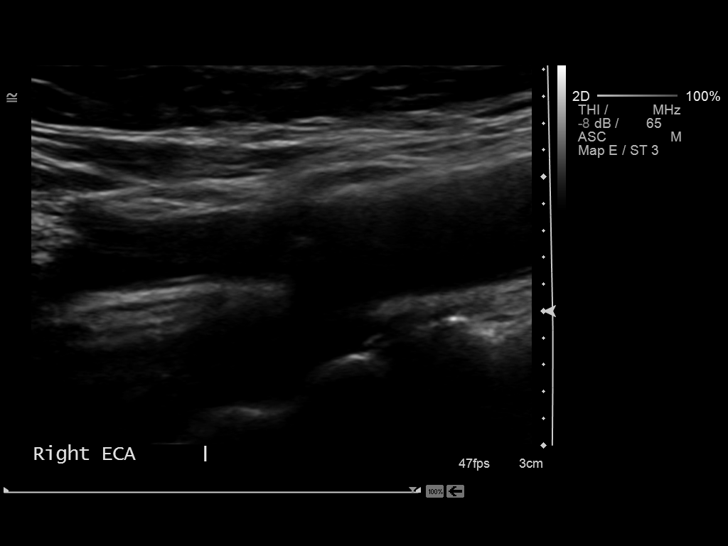
[im 24/69]
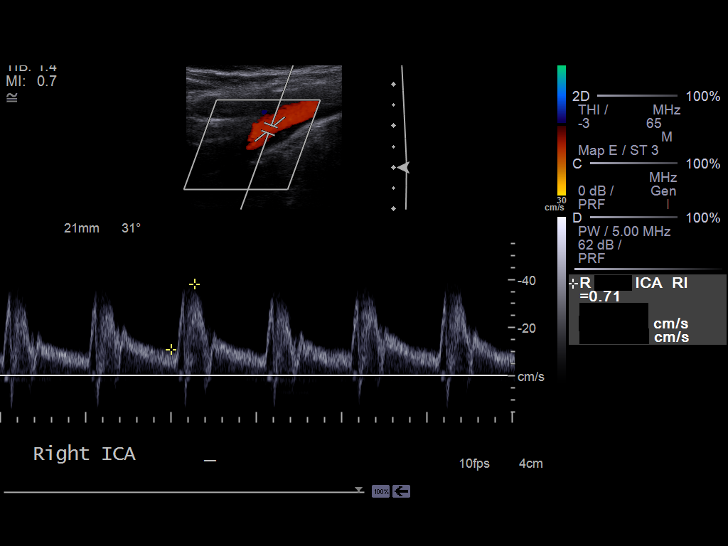
[im 30/69]
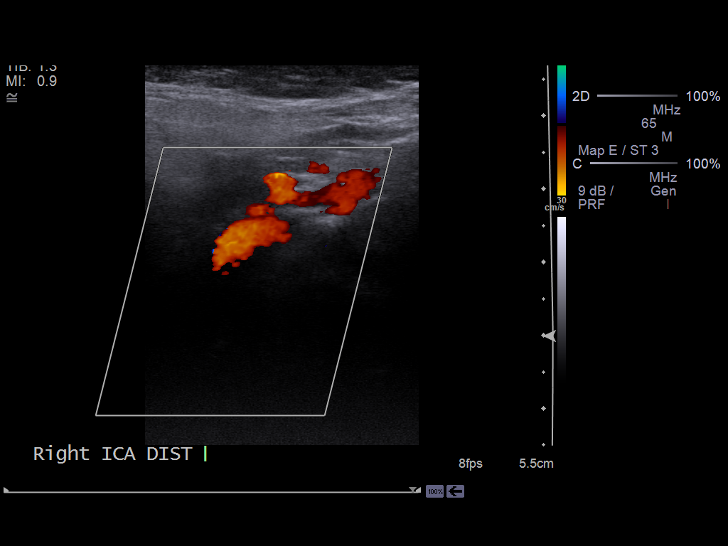
[im 36/69]
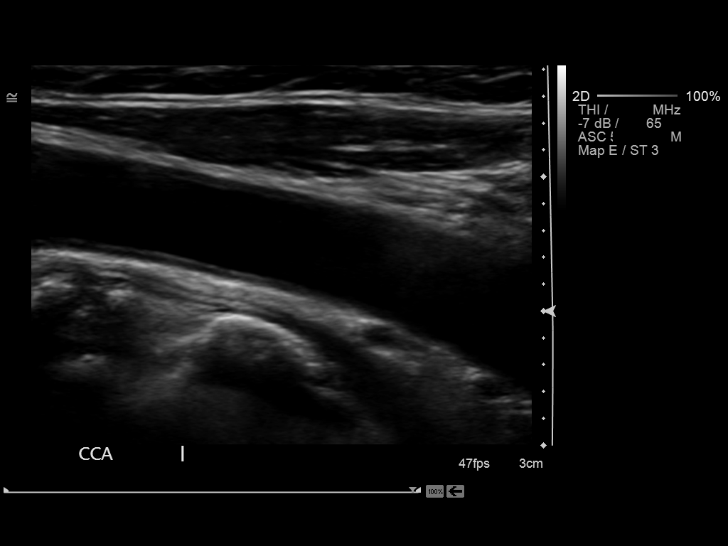
[im 39/69]
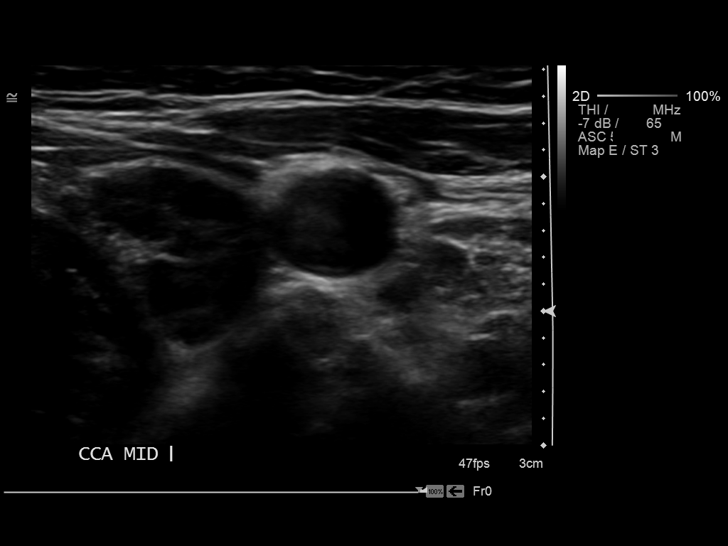
[im 45/69]
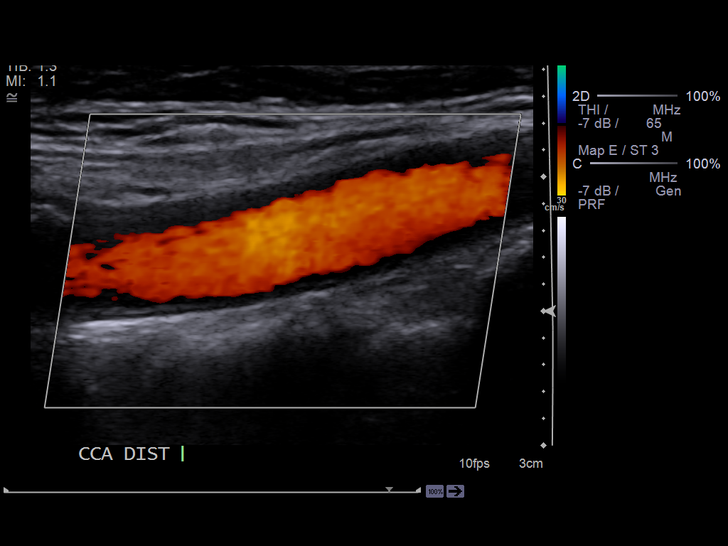
[im 51/69]
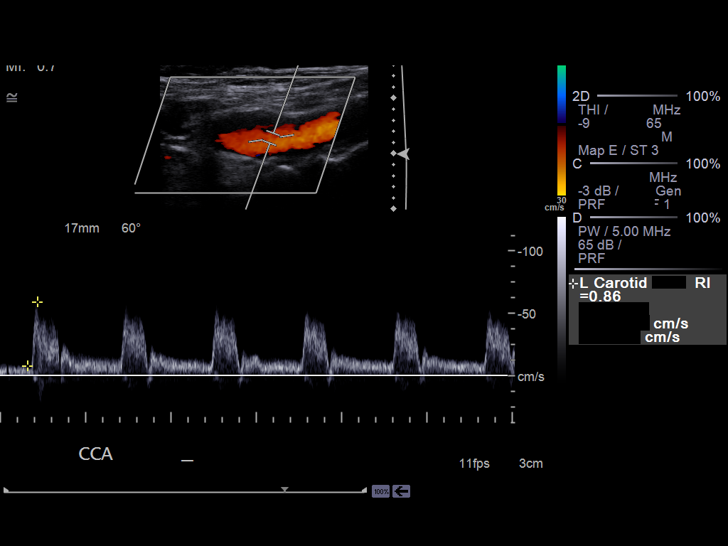
[im 57/69]
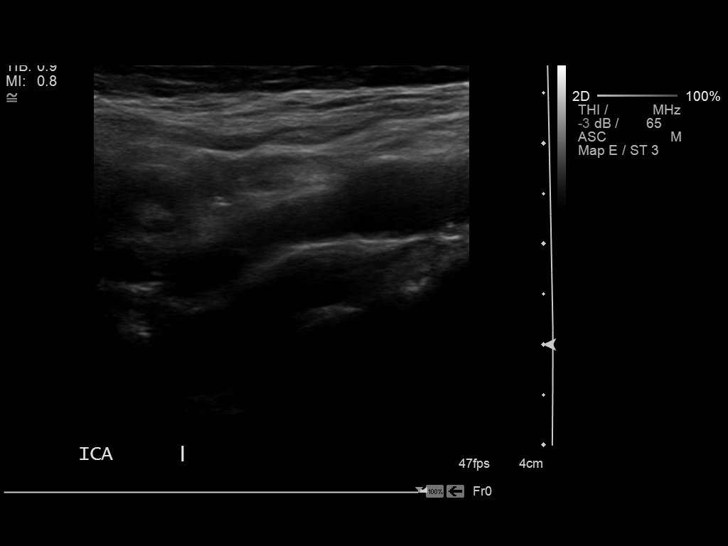
[im 63/69]
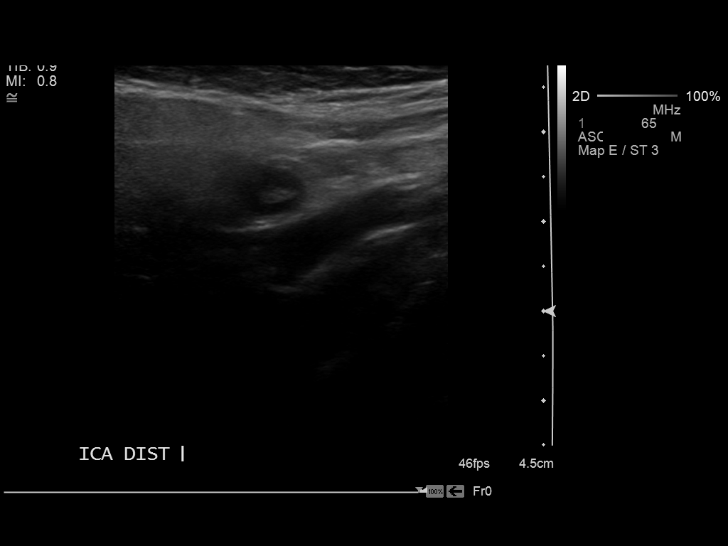
[im 69/69]
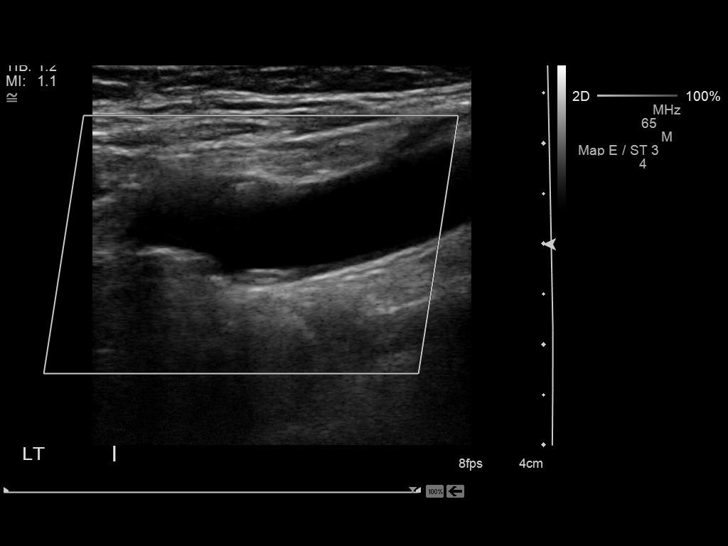

[13 of 24 positions shown; findings below may reference images not displayed]

Criteria:  Quantification of carotid stenosis is based on velocity
parameters that correlate the residual internal carotid diameter
with NASCET-based stenosis levels, using the diameter of the distal
internal carotid lumen as the denominator for stenosis measurement.

The following velocity measurements were obtained:

                 PEAK SYSTOLIC/END DIASTOLIC
RIGHT
ICA:                        104cm/sec
CCA:                        89cm/sec
SYSTOLIC ICA/CCA RATIO:
DIASTOLIC ICA/CCA RATIO:
ECA:                        90cm/sec

LEFT
ICA:                        97cm/sec
CCA:                        V5cm/sec
SYSTOLIC ICA/CCA RATIO:
DIASTOLIC ICA/CCA RATIO:
ECA:                        107cm/sec
FINDINGS: RIGHT CAROTID ARTERY: Little if any plaque in the bulb.  Low
resistance internal carotid Doppler pattern.

RIGHT VERTEBRAL ARTERY:  Antegrade.

LEFT CAROTID ARTERY: Mild soft plaque in the bulb.  Low resistance
internal carotid Doppler pattern.

LEFT VERTEBRAL ARTERY:  Antegrade.
IMPRESSION: Less than 50% stenosis in the right and left internal carotid
arteries.

## 2013-12-22 ENCOUNTER — Ambulatory Visit (INDEPENDENT_AMBULATORY_CARE_PROVIDER_SITE_OTHER): Payer: Medicare HMO | Admitting: Psychiatry

## 2013-12-22 DIAGNOSIS — F3289 Other specified depressive episodes: Secondary | ICD-10-CM

## 2013-12-22 DIAGNOSIS — F329 Major depressive disorder, single episode, unspecified: Secondary | ICD-10-CM

## 2013-12-22 NOTE — Patient Instructions (Signed)
Discussed orally 
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
## 2014-01-12 ENCOUNTER — Other Ambulatory Visit (HOSPITAL_COMMUNITY): Payer: Self-pay | Admitting: Psychiatry

## 2014-01-19 ENCOUNTER — Ambulatory Visit (INDEPENDENT_AMBULATORY_CARE_PROVIDER_SITE_OTHER): Payer: Medicare HMO | Admitting: Psychiatry

## 2014-01-19 ENCOUNTER — Other Ambulatory Visit (HOSPITAL_COMMUNITY): Payer: Self-pay | Admitting: Family Medicine

## 2014-01-19 DIAGNOSIS — F329 Major depressive disorder, single episode, unspecified: Secondary | ICD-10-CM

## 2014-01-19 DIAGNOSIS — F3289 Other specified depressive episodes: Secondary | ICD-10-CM

## 2014-01-19 DIAGNOSIS — Z1231 Encounter for screening mammogram for malignant neoplasm of breast: Secondary | ICD-10-CM
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
## 2014-01-20 NOTE — Patient Instructions (Signed)
Discussed orally 

## 2014-02-04 ENCOUNTER — Ambulatory Visit (HOSPITAL_COMMUNITY): Payer: Self-pay

## 2014-02-09 ENCOUNTER — Ambulatory Visit (HOSPITAL_COMMUNITY): Payer: Self-pay | Admitting: Psychiatry

## 2014-02-17 ENCOUNTER — Telehealth: Payer: Self-pay | Admitting: Gastroenterology

## 2014-02-17 ENCOUNTER — Ambulatory Visit: Payer: Self-pay | Admitting: Gastroenterology

## 2014-02-17 ENCOUNTER — Encounter: Payer: Self-pay | Admitting: Gastroenterology

## 2014-02-17 NOTE — Telephone Encounter (Signed)
Pt was a no show

## 2014-02-17 NOTE — Telephone Encounter (Signed)
Mailed letter °

## 2014-03-01 ENCOUNTER — Ambulatory Visit (HOSPITAL_COMMUNITY)
Admission: RE | Admit: 2014-03-01 | Discharge: 2014-03-01 | Disposition: A | Discharge status: Home / Self Care | Payer: Medicare HMO | Source: Ambulatory Visit | Attending: Family Medicine | Admitting: Family Medicine

## 2014-03-01 DIAGNOSIS — Z1231 Encounter for screening mammogram for malignant neoplasm of breast: Secondary | ICD-10-CM | POA: Insufficient documentation

## 2014-03-02 ENCOUNTER — Ambulatory Visit (HOSPITAL_COMMUNITY): Payer: Self-pay | Admitting: Psychiatry

## 2014-03-19 ENCOUNTER — Other Ambulatory Visit (HOSPITAL_COMMUNITY): Payer: Self-pay | Admitting: Psychiatry

## 2014-03-31 ENCOUNTER — Encounter: Payer: Self-pay | Admitting: Gastroenterology

## 2014-03-31 ENCOUNTER — Encounter (INDEPENDENT_AMBULATORY_CARE_PROVIDER_SITE_OTHER): Payer: Self-pay

## 2014-03-31 ENCOUNTER — Ambulatory Visit (INDEPENDENT_AMBULATORY_CARE_PROVIDER_SITE_OTHER): Payer: Medicare HMO | Admitting: Gastroenterology

## 2014-03-31 VITALS — BP 129/68 | HR 72 | Temp 98.8°F | Resp 18 | Ht 66.5 in | Wt 163.0 lb

## 2014-03-31 DIAGNOSIS — R1314 Dysphagia, pharyngoesophageal phase: Secondary | ICD-10-CM

## 2014-03-31 DIAGNOSIS — R160 Hepatomegaly, not elsewhere classified: Secondary | ICD-10-CM | POA: Insufficient documentation

## 2014-03-31 DIAGNOSIS — R1319 Other dysphagia: Secondary | ICD-10-CM

## 2014-03-31 DIAGNOSIS — R1310 Dysphagia, unspecified: Secondary | ICD-10-CM

## 2014-03-31 DIAGNOSIS — K5900 Constipation, unspecified: Secondary | ICD-10-CM | POA: Insufficient documentation

## 2014-03-31 DIAGNOSIS — Z86010 Personal history of colonic polyps: Secondary | ICD-10-CM

## 2014-03-31 MED ORDER — LINACLOTIDE 145 MCG PO CAPS
145.0000 ug | ORAL_CAPSULE | Freq: Every day | ORAL | Status: DC
Start: 2014-03-31 — End: 2014-05-04

## 2014-03-31 MED ORDER — PEG 3350-KCL-NA BICARB-NACL 420 G PO SOLR
4000.0000 mL | ORAL | Status: DC
Start: 2014-03-31 — End: 2014-04-14

## 2014-03-31 NOTE — Assessment & Plan Note (Signed)
63 year old lady with history of adenomatous colon polyps due for surveillance colonoscopy. History of chronic diarrhea treated with WelChol in the past. Patient tells me she is having more constipation even with increase in dose. Recommend colonoscopy in the near future. I have discussed the risks, alternatives, benefits with regards to but not limited to the risk of reaction to medication, bleeding, infection, perforation and the patient is agreeable to proceed. Written consent to be obtained. Would recommend stopping WelChol unless she is taking it for her diabetes her cholesterol. She will discuss with her PCP tomorrow at her office visit. In order to ensure she has adequate bowel prep, she will take Linzess 145mg daily for constipation for at least 3-4 days before her procedure. RX and samples provided.

## 2014-03-31 NOTE — Assessment & Plan Note (Signed)
Hepatomegaly noted on exam. This was not mentioned on prior evaluation by [place] back in 2011. Looking through her imaging studies, noncontrast CT in 2014 reported hepatomegaly again identified as when compared 2008. 2008 report did not mention hepatomegaly. She denies alcohol use. Suspect fatty liver. Her LFTs are normal. Await EGD findings to see if there's any suggestions of advanced liver disease. To discuss further with Dr. Gala Romney as well.

## 2014-03-31 NOTE — Assessment & Plan Note (Signed)
Solid food esophageal dysphagia. EGD/ED in the near future. Suspect esophageal ring.  I have discussed the risks, alternatives, benefits with regards to but not limited to the risk of reaction to medication, bleeding, infection, perforation and the patient is agreeable to proceed. Written consent to be obtained.

## 2014-03-31 NOTE — Patient Instructions (Signed)
1. Linzess 136mg daily on empty stomach at least 3-4 days before your bowel prep starts. I also gave RX voucher so you can get 30 day supply for free. 2. Colonoscopy and upper endoscopy as scheduled. 3. I will discuss your enlarged liver with Dr. RGala Romney Further recommendations to follow.
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
## 2014-04-01 NOTE — Progress Notes (Signed)
cc'd to pcp 

## 2014-04-14 ENCOUNTER — Encounter (HOSPITAL_COMMUNITY): Payer: Self-pay | Admitting: Pharmacy Technician

## 2014-04-23 ENCOUNTER — Other Ambulatory Visit: Payer: Self-pay | Admitting: Internal Medicine

## 2014-04-23 MED ORDER — PEG 3350-KCL-NA BICARB-NACL 420 G PO SOLR: 4000.0000 mL | ORAL | Status: DC

## 2014-04-26 ENCOUNTER — Other Ambulatory Visit (HOSPITAL_COMMUNITY): Payer: Self-pay | Admitting: Psychiatry

## 2014-04-29 ENCOUNTER — Encounter (HOSPITAL_COMMUNITY)
Admission: RE | Disposition: A | Discharge status: Home / Self Care | Payer: Self-pay | Source: Ambulatory Visit | Attending: Internal Medicine

## 2014-04-29 ENCOUNTER — Encounter (HOSPITAL_COMMUNITY): Payer: Self-pay | Admitting: *Deleted

## 2014-04-29 ENCOUNTER — Ambulatory Visit (HOSPITAL_COMMUNITY)
Admission: RE | Admit: 2014-04-29 | Discharge: 2014-04-29 | Disposition: A | Discharge status: Home / Self Care | Payer: Medicare HMO | Source: Ambulatory Visit | Attending: Internal Medicine | Admitting: Internal Medicine

## 2014-04-29 DIAGNOSIS — Z860100 Personal history of colon polyps, unspecified: Secondary | ICD-10-CM | POA: Insufficient documentation

## 2014-04-29 DIAGNOSIS — F17200 Nicotine dependence, unspecified, uncomplicated: Secondary | ICD-10-CM | POA: Insufficient documentation

## 2014-04-29 DIAGNOSIS — R1310 Dysphagia, unspecified: Secondary | ICD-10-CM | POA: Insufficient documentation

## 2014-04-29 DIAGNOSIS — F329 Major depressive disorder, single episode, unspecified: Secondary | ICD-10-CM | POA: Insufficient documentation

## 2014-04-29 DIAGNOSIS — Z79899 Other long term (current) drug therapy: Secondary | ICD-10-CM | POA: Insufficient documentation

## 2014-04-29 DIAGNOSIS — E785 Hyperlipidemia, unspecified: Secondary | ICD-10-CM | POA: Insufficient documentation

## 2014-04-29 DIAGNOSIS — E039 Hypothyroidism, unspecified: Secondary | ICD-10-CM | POA: Insufficient documentation

## 2014-04-29 DIAGNOSIS — F3289 Other specified depressive episodes: Secondary | ICD-10-CM | POA: Insufficient documentation

## 2014-04-29 DIAGNOSIS — R160 Hepatomegaly, not elsewhere classified: Secondary | ICD-10-CM

## 2014-04-29 DIAGNOSIS — E119 Type 2 diabetes mellitus without complications: Secondary | ICD-10-CM | POA: Insufficient documentation

## 2014-04-29 DIAGNOSIS — Z7982 Long term (current) use of aspirin: Secondary | ICD-10-CM | POA: Insufficient documentation

## 2014-04-29 DIAGNOSIS — Z800 Family history of malignant neoplasm of digestive organs: Secondary | ICD-10-CM | POA: Insufficient documentation

## 2014-04-29 DIAGNOSIS — K449 Diaphragmatic hernia without obstruction or gangrene: Secondary | ICD-10-CM | POA: Insufficient documentation

## 2014-04-29 DIAGNOSIS — Z794 Long term (current) use of insulin: Secondary | ICD-10-CM | POA: Insufficient documentation

## 2014-04-29 DIAGNOSIS — R1319 Other dysphagia: Secondary | ICD-10-CM

## 2014-04-29 DIAGNOSIS — K5900 Constipation, unspecified: Secondary | ICD-10-CM

## 2014-04-29 DIAGNOSIS — Z538 Procedure and treatment not carried out for other reasons: Secondary | ICD-10-CM | POA: Insufficient documentation

## 2014-04-29 HISTORY — PX: ESOPHAGOGASTRODUODENOSCOPY: SHX5428

## 2014-04-29 HISTORY — PX: COLONOSCOPY: SHX5424

## 2014-04-29 HISTORY — PX: MALONEY DILATION: SHX5535

## 2014-04-29 LAB — GLUCOSE, CAPILLARY: GLUCOSE-CAPILLARY: 342 mg/dL — AB (ref 70–99)

## 2014-04-29 SURGERY — COLONOSCOPY
Anesthesia: Moderate Sedation

## 2014-04-29 MED ORDER — SODIUM CHLORIDE 0.9 % IV SOLN
INTRAVENOUS | Status: DC
  Administered 2014-04-29: 08:00:00 via INTRAVENOUS

## 2014-04-29 MED ORDER — LIDOCAINE VISCOUS 2 % MT SOLN
OROMUCOSAL | Status: AC
  Filled 2014-04-29: qty 15

## 2014-04-29 MED ORDER — MIDAZOLAM HCL 5 MG/5ML IJ SOLN
INTRAMUSCULAR | Status: DC | PRN
  Administered 2014-04-29: 1 mg via INTRAVENOUS
  Administered 2014-04-29: 2 mg via INTRAVENOUS

## 2014-04-29 MED ORDER — MIDAZOLAM HCL 5 MG/5ML IJ SOLN
INTRAMUSCULAR | Status: AC
  Filled 2014-04-29: qty 10

## 2014-04-29 MED ORDER — LIDOCAINE VISCOUS 2 % MT SOLN
OROMUCOSAL | Status: DC | PRN
  Administered 2014-04-29: 3 mL via OROMUCOSAL

## 2014-04-29 MED ORDER — ONDANSETRON HCL 4 MG/2ML IJ SOLN
INTRAMUSCULAR | Status: DC | PRN
  Administered 2014-04-29: 4 mg via INTRAVENOUS

## 2014-04-29 MED ORDER — MEPERIDINE HCL 100 MG/ML IJ SOLN
INTRAMUSCULAR | Status: AC
  Filled 2014-04-29: qty 2

## 2014-04-29 MED ORDER — MEPERIDINE HCL 100 MG/ML IJ SOLN
INTRAMUSCULAR | Status: DC | PRN
  Administered 2014-04-29: 50 mg via INTRAVENOUS

## 2014-04-29 MED ORDER — ONDANSETRON HCL 4 MG/2ML IJ SOLN
INTRAMUSCULAR | Status: AC
  Filled 2014-04-29: qty 2

## 2014-04-29 MED ORDER — STERILE WATER FOR IRRIGATION IR SOLN
Status: DC | PRN
  Administered 2014-04-29: 08:00:00

## 2014-04-29 NOTE — Op Note (Signed)
[facility] [address] [place], [address]   ENDOSCOPY PROCEDURE REPORT  PATIENT: [name], [name]  MR#: [id number] BIRTHDATE: 1951/10/06 , 56  yrs. old GENDER: Female ENDOSCOPIST: R.  Garfield Cornea, MD FACP [facility] REFERRED BY:  Lucia Gaskins, M.D. PROCEDURE DATE:  04/29/2014 PROCEDURE:     EGD with Venia Minks dilation, gastric biopsy  INDICATIONS:      Recurrent esophageal dysphagia  INFORMED CONSENT:   The risks, benefits, limitations, alternatives and imponderables have been discussed.  The potential for biopsy, esophogeal dilation, etc. have also been reviewed.  Questions have been answered.  All parties agreeable.  Please see the history and physical in the medical record for more information.  MEDICATIONS:    Versed 3 mg IV and Demerol 50 mg IV in divided doses. Xylocaine gel orally. Zofran 4 mg IV.  DESCRIPTION OF PROCEDURE:   The EG-[id number] ([id number])  endoscope was introduced through the mouth and advanced to the second portion of the duodenum without difficulty or limitations.  The mucosal surfaces were surveyed very carefully during advancement of the scope and upon withdrawal.  Retroflexion view of the proximal stomach and esophagogastric junction was performed.      FINDINGS:  Normal-appearing, patent tubular esophagus. Stomach empty. Some mottling of the gastric mucosa diffusely. Patchy erythema. No ulcer or infiltrating process. Small hiatal hernia. Patent pylorus. Normal first and second portion of the duodenum.  THERAPEUTIC / DIAGNOSTIC MANEUVERS PERFORMED:  A 56 French Maloney dilator was passed to full insertion easily. A look back revealed no apparent complication related to this maneuver. Subsequently, biopsies of the abnormal-appearing gastric mucosa taken for histologic study.   COMPLICATIONS:  None  IMPRESSION:   Normal esophagus-status post passage of a Maloney dilator. Small hiatal hernia. Abnormal gastric mucosa of  doubtful clinical significance-status post gastric biopsy.  RECOMMENDATIONS:   Followup on pathology. See colonoscopy report.    _______________________________ R. Garfield Cornea, MD FACP [facility] eSigned:  R. Garfield Cornea, MD FACP [facility] 04/29/2014 8:35 AM     CC:

## 2014-04-29 NOTE — Op Note (Signed)
[facility] [address] [place], [address]   COLONOSCOPY PROCEDURE REPORT  PATIENT: [name], [name]  MR#:         [id number] BIRTHDATE: 02-15-1951 , 11  yrs. old GENDER: Female ENDOSCOPIST: R.  Garfield Cornea, MD FACP [facility] REFERRED BY:  Lucia Gaskins, M.D. PROCEDURE DATE:  04/29/2014 PROCEDURE:     attempted/incomplete colonoscopy  INDICATIONS: history colonic adenoma; positive family history colon cancer.  INFORMED CONSENT:  The risks, benefits, alternatives and imponderables including but not limited to bleeding, perforation as well as the possibility of a missed lesion have been reviewed.  The potential for biopsy, lesion removal, etc. have also been discussed.  Questions have been answered.  All parties agreeable. Please see the history and physical in the medical record for more information.  MEDICATIONS: Versed 3 mg IV and Demerol 50 mg IV in divided doses. Zofran 4 mg IV.  DESCRIPTION OF PROCEDURE:  [id number] ([id number])   FINDINGS:  Preparation was inadequate. Formed stool in the rectum extending well into the sigmoid colon. Did not  clear as scope advanced into the sigmoid. It was evident that I would not be able to complete the examination today due to inadequate preparation.  THERAPEUTIC / DIAGNOSTIC MANEUVERS PERFORMED:  none  COMPLICATIONS: none  CECAL WITHDRAWAL TIME:  not applicable  IMPRESSION:  Attempted/incomplete colonoscopy. Inadequate preparation precluded completion of examination.  RECOMMENDATIONS: Will give the patient the opportunity to return for colonoscopy in the setting of an adequate preparation. See EGD report.   _______________________________ eSigned:  R. Garfield Cornea, MD FACP [facility] 04/29/2014 8:48 AM   CC:

## 2014-04-29 NOTE — Interval H&P Note (Signed)
History and Physical Interval Note:  04/29/2014 8:10 AM  [name]  has presented today for surgery, with the diagnosis of DYSPHAGIA, CONSTIPATION, FAMILY HISTORY OF COLON CANCER  The various methods of treatment have been discussed with the patient and family. After consideration of risks, benefits and other options for treatment, the patient has consented to  Procedure(s) with comments: COLONOSCOPY (N/A) - 8:00 ESOPHAGOGASTRODUODENOSCOPY (EGD) (N/A) SAVORY DILATION (N/A) MALONEY DILATION (N/A) as a surgical intervention .  The patient's history has been reviewed, patient examined, no change in status, stable for surgery.  I have reviewed the patient's chart and labs.  Questions were answered to the patient's satisfaction.     [name]  No change. EGD with dilation as appropriate and colonoscopy per plan.The risks, benefits, limitations, imponderables and alternatives regarding both EGD and colonoscopy have been reviewed with the patient. Questions have been answered. All parties agreeable.

## 2014-04-29 NOTE — H&P (View-Only) (Signed)
Primary Care Physician:  Maricela Curet, MD  Primary Gastroenterologist:  Garfield Cornea, MD   Chief Complaint  Patient presents with  . Colon Cancer Screening    prep    HPI:  [name] is a 63 y.o. female here because she is due for her surveillance colonoscopy. She has a history of adenomatous colon polyps and her last colonoscopy was 5 years ago. She was last seen in our office in 2011. She also has a history of chronic diarrhea previously felt to be related to a combination of diabetic visceral enteropathy and possibly IBS. She's also been managed with bowel acid sequestrants. Patient states overall she's been doing pretty well. She used to have a tendency towards diarrhea but now she may go 2 or 3 days without a bowel movement. She relates this to a change in her Welchol from 3 tablets twice a day to now only one tablet daily. Would suspect opposite result. States most of her stools are hard. Denies any blood in the stool. She has bloating associated with constipation. Denies any heartburn. No nausea or vomiting. She complains of esophageal dysphagia to meat. Couple of episodes of near impaction.  She was told by her cardiologist years ago that her liver was enlarged.  Current Outpatient Prescriptions  Medication Sig Dispense Refill  . ARIPiprazole (ABILIFY) 5 MG tablet Take 1 tablet (5 mg total) by mouth daily.  30 tablet  2  . aspirin 81 MG chewable tablet Chew 81 mg by mouth daily.      . Cholecalciferol (VITAMIN D-3) 1000 UNITS CAPS Take 1 capsule by mouth daily.      . cholecalciferol 1000 UNITS tablet Take 1 tablet (1,000 Units total) by mouth daily.  30 tablet  2  . colesevelam (WELCHOL) 625 MG tablet Take 625 mg by mouth daily.      . DULoxetine (CYMBALTA) 60 MG capsule Take 1 capsule (60 mg total) by mouth daily.  30 capsule  2  . fish oil-omega-3 fatty acids 1000 MG capsule Take 2 g by mouth daily.        Marland Kitchen levothyroxine (SYNTHROID, LEVOTHROID) 200 MCG tablet Take 200  mcg by mouth daily before breakfast.      . lisinopril (PRINIVIL,ZESTRIL) 20 MG tablet Take 20 mg by mouth daily.        . Melatonin 3 MG TABS Take 1 tablet by mouth at bedtime as needed (insomnia).      . metFORMIN (GLUCOPHAGE) 500 MG tablet Take 500 mg by mouth 2 (two) times daily.      . metoprolol tartrate (LOPRESSOR) 12.5 mg TABS tablet Take 0.5 tablets (12.5 mg total) by mouth 2 (two) times daily.  30 tablet  6  . nitroGLYCERIN (NITROLINGUAL) 0.4 MG/SPRAY spray Place 1 spray under the tongue every 5 (five) minutes x 3 doses as needed for chest pain.      . pravastatin (PRAVACHOL) 40 MG tablet Take 40 mg by mouth daily.        . traMADol (ULTRAM) 50 MG tablet Take 50 mg by mouth 3 (three) times daily.      . traZODone (DESYREL) 100 MG tablet Take 1 tablet (100 mg total) by mouth at bedtime.  30 tablet  2  . LANTUS SOLOSTAR 100 UNIT/ML Solostar Pen 20 Units at bedtime.       No current facility-administered medications for this visit.    Allergies as of 03/31/2014 - Review Complete 03/31/2014  Allergen Reaction Noted  . Ciprofloxacin  02/04/2007  . Iohexol  02/14/2005  . Latex  03/31/2014  . Neomycin-bacitracin zn-polymyx  02/04/2007  . Penicillins  02/04/2007  . Povidone-iodine  02/04/2007  . Tape  03/31/2014    Past Medical History  Diagnosis Date  . Diabetes mellitus   . COPD (chronic obstructive pulmonary disease)   . ASCVD (arteriosclerotic cardiovascular disease)     MI in 96 requiring BMS CX; DES to M1 in 2000;normal coronary angiography in 2004  . Hyperlipidemia   . Hypothyroidism   . Tobacco abuse   . Depression   . DVT (deep venous thrombosis)   . Nephrolithiasis 2006    stone extraction   . Amputation of hand, right     traumatic  . Allergic rhinitis   . Cholelithiasis   . GERD (gastroesophageal reflux disease)   . Low back pain   . Peripheral neuropathy   . Diabetes mellitus type I   . DDD (degenerative disc disease), lumbar   . Sciatic pain     right   . Tremor     Past Surgical History  Procedure Laterality Date  . Dilation and curettage of uterus  1974  . Partial hysterectomy  1978  . Total abdominal hysterectomy w/ bilateral salpingoophorectomy  2002  . Shoulder surgery      Left shoulder for RTC;left arm surgery '98/left hand surgery 2001  . Cholecystectomy    . Umbilical hernia repair  2008  . Colonoscopy  01/2009    ASN:[id number] rectum/repeat in 5 yrs  . Back surgery    . Hand amputation Right     traumatic  . Esophagogastroduodenoscopy  05/2010    Dr. Tessie Fass, erosion. 30F dilation    Family History  Problem Relation Age of Onset  . Depression Mother   . Bipolar disorder Mother   . Dementia Mother   . Alcohol abuse Father   . Aneurysm Father     deceased age 58, brain  . Pulmonary fibrosis Mother   . Colon cancer Paternal Grandfather     age greater than 76    History   Social History  . Marital Status: Married    Spouse Name: N/A    Number of Children: N/A  . Years of Education: N/A   Occupational History  . 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 Smoking status: Current Every Day Smoker -- 0.50 packs/day for 40 years    Types: Cigarettes  . Smokeless tobacco: Not on file  . Alcohol Use: No  . Drug Use: No  . Sexual Activity: No   Other Topics Concern  . Not on file   Social History Narrative   Married x3   Lives with husband   GED in 2005      ROS:  General: Negative for anorexia, weight loss, fever, chills, fatigue, weakness. Eyes: Negative for vision changes.  ENT: Negative for hoarseness, difficulty swallowing , nasal congestion. CV: Negative for chest pain, angina, palpitations, dyspnea on exertion, peripheral edema.  Respiratory: Negative for dyspnea at rest, dyspnea on exertion, cough, sputum, wheezing.  GI: See history of present illness. GU:  Negative for dysuria, hematuria, urinary incontinence, urinary frequency, nocturnal  urination.  MS: Negative for joint pain, low back pain.  Derm: Negative for rash or itching.  Neuro: Negative for weakness, abnormal sensation, seizure, frequent headaches, memory loss, confusion. Positive chronic tremor Psych: Positive depression, negative suicidal ideation, hallucinations.  Endo: Negative for unusual weight  change.  Heme: Negative for bruising or bleeding. Allergy: Negative for rash or hives.    Physical Examination:  BP 129/68  Pulse 72  Temp(Src) 98.8 F (37.1 C) (Oral)  Resp 18  Ht 5' 6.5" (1.689 m)  Wt 163 lb (73.936 kg)  BMI 25.92 kg/m2   General: Well-nourished, well-developed in no acute distress.  Head: Normocephalic, atraumatic.   Eyes: Conjunctiva pink, no icterus. Mouth: Oropharyngeal mucosa moist and pink , no lesions erythema or exudate. Neck: Supple without thyromegaly, masses, or lymphadenopathy.  Lungs: Clear to auscultation bilaterally.  Heart: Regular rate and rhythm, no murmurs rubs or gallops.  Abdomen: Bowel sounds are normal, nontender, nondistended, no splenomegaly or masses, no abdominal bruits or    hernia , no rebound or guarding.  Enlarged liver easily palpable in the right upper quadrant Rectal: defered Extremities: No lower extremity edema. No clubbing or deformities.  Neuro: Alert and oriented x 4 , grossly normal neurologically.  Skin: Warm and dry, no rash or jaundice.   Psych: Alert and cooperative, normal mood and affect.  Labs: Lab Results  Component Value Date   WBC 6.7 11/01/2013   HGB 11.5* 11/01/2013   HCT 31.9* 11/01/2013   MCV 88.9 11/01/2013   PLT 149* 11/01/2013   Lab Results  Component Value Date   CREATININE 1.04 11/03/2013   BUN 14 11/03/2013   NA 144 11/03/2013   K 4.1 11/03/2013   CL 113* 11/03/2013   CO2 21 11/03/2013   Lab Results  Component Value Date   ALT 17 10/30/2013   AST 13 10/30/2013   ALKPHOS 93 10/30/2013   BILITOT 0.2* 10/30/2013     Imaging Studies: No results found.

## 2014-04-29 NOTE — Discharge Instructions (Signed)
Colonoscopy Discharge Instructions  Read the instructions outlined below and refer to this sheet in the next few weeks. These discharge instructions provide you with general information on caring for yourself after you leave the hospital. Your doctor may also give you specific instructions. While your treatment has been planned according to the most current medical practices available, unavoidable complications occasionally occur. If you have any problems or questions after discharge, call Dr. Gala Romney at [phone]. ACTIVITY  You may resume your regular activity, but move at a slower pace for the next 24 hours.   Take frequent rest periods for the next 24 hours.   Walking will help get rid of the air and reduce the bloated feeling in your belly (abdomen).   No driving for 24 hours (because of the medicine (anesthesia) used during the test).    Do not sign any important legal documents or operate any machinery for 24 hours (because of the anesthesia used during the test).  NUTRITION  Drink plenty of fluids.   You may resume your normal diet as instructed by your doctor.   Begin with a light meal and progress to your normal diet. Heavy or fried foods are harder to digest and may make you feel sick to your stomach (nauseated).   Avoid alcoholic beverages for 24 hours or as instructed.  MEDICATIONS  You may resume your normal medications unless your doctor tells you otherwise.  WHAT YOU CAN EXPECT TODAY  Some feelings of bloating in the abdomen.   Passage of more gas than usual.   Spotting of blood in your stool or on the toilet paper.  IF YOU HAD POLYPS REMOVED DURING THE COLONOSCOPY:  No aspirin products for 7 days or as instructed.   No alcohol for 7 days or as instructed.   Eat a soft diet for the next 24 hours.  FINDING OUT THE RESULTS OF YOUR TEST Not all test results are available during your visit. If your test results are not back during the visit, make an appointment  with your caregiver to find out the results. Do not assume everything is normal if you have not heard from your caregiver or the medical facility. It is important for you to follow up on all of your test results.  SEEK IMMEDIATE MEDICAL ATTENTION IF:  You have more than a spotting of blood in your stool.   Your belly is swollen (abdominal distention).   You are nauseated or vomiting.   You have a temperature over 101.  You have abdominal pain or discomfort that is severe or gets worse throughout the day. EGD Discharge instructions Please read the instructions outlined below and refer to this sheet in the next few weeks. These discharge instructions provide you with general information on caring for yourself after you leave the hospital. Your doctor may also give you specific instructions. While your treatment has been planned according to the most current medical practices available, unavoidable complications occasionally occur. If you have any problems or questions after discharge, please call your doctor. ACTIVITY You may resume your regular activity but move at a slower pace for the next 24 hours.  Take frequent rest periods for the next 24 hours.  Walking will help expel (get rid of) the air and reduce the bloated feeling in your abdomen.  No driving for 24 hours (because of the anesthesia (medicine) used during the test).  You may shower.  Do not sign any important legal documents or operate any machinery for 24  hours (because of the anesthesia used during the test).  NUTRITION Drink plenty of fluids.  You may resume your normal diet.  Begin with a light meal and progress to your normal diet.  Avoid alcoholic beverages for 24 hours or as instructed by your caregiver.  MEDICATIONS You may resume your normal medications unless your caregiver tells you otherwise.  WHAT YOU CAN EXPECT TODAY You may experience abdominal discomfort such as a feeling of fullness or gas pains.   FOLLOW-UP Your doctor will discuss the results of your test with you.  SEEK IMMEDIATE MEDICAL ATTENTION IF ANY OF THE FOLLOWING OCCUR: Excessive nausea (feeling sick to your stomach) and/or vomiting.  Severe abdominal pain and distention (swelling).  Trouble swallowing.  Temperature over 101 F (37.8 C).  Rectal bleeding or vomiting of blood.       Further recommendations to follow pending review of pathology report    You were not cleaned out for colonoscopy today. My office will be in touch to reschedule.

## 2014-05-01 ENCOUNTER — Encounter: Payer: Self-pay | Admitting: Internal Medicine

## 2014-05-04 ENCOUNTER — Other Ambulatory Visit: Payer: Self-pay | Admitting: Gastroenterology

## 2014-05-05 ENCOUNTER — Encounter (HOSPITAL_COMMUNITY): Payer: Self-pay | Admitting: Internal Medicine

## 2014-05-11 ENCOUNTER — Encounter: Payer: Self-pay | Admitting: Gastroenterology

## 2014-05-11 ENCOUNTER — Ambulatory Visit (INDEPENDENT_AMBULATORY_CARE_PROVIDER_SITE_OTHER): Payer: Medicare HMO | Admitting: Gastroenterology

## 2014-05-11 ENCOUNTER — Encounter (INDEPENDENT_AMBULATORY_CARE_PROVIDER_SITE_OTHER): Payer: Self-pay

## 2014-05-11 VITALS — BP 132/76 | HR 78 | Temp 98.0°F | Ht 66.5 in | Wt 163.0 lb

## 2014-05-11 DIAGNOSIS — Z86010 Personal history of colonic polyps: Secondary | ICD-10-CM

## 2014-05-11 DIAGNOSIS — R197 Diarrhea, unspecified: Secondary | ICD-10-CM

## 2014-05-11 MED ORDER — COLESEVELAM HCL 625 MG PO TABS: 625.0000 mg | ORAL_TABLET | Freq: Three times a day (TID) | ORAL | Status: DC

## 2014-05-11 NOTE — Patient Instructions (Signed)
1. We will schedule you for a colonoscopy in the near future.  2. You may increase Welchol for management of your diarrhea BUT you need to stop the medication one week before your bowel prep/colonoscopy. 3. Linzess provided for intermittent constipation, take one daily as needed with food.

## 2014-05-11 NOTE — Assessment & Plan Note (Signed)
Increase welchol to 1 po TID. Advised to hold for one week prior to bowel prep for upcoming colonoscopy.

## 2014-05-11 NOTE — Progress Notes (Signed)
Primary Care Physician:  Maricela Curet, MD  Primary Gastroenterologist:  Garfield Cornea, MD   Chief Complaint  Patient presents with  . Reschedule colonoscopy    HPI:  [name] is a 63 y.o. female here to schedule repeat colonoscopy. Failed attempt back on 04/29/2014 due to inadequate prep. Formed stool noted in the rectum extending well into the sigmoid colon. Procedure scheduled as surveillance maneuver for history of adenomatous colon polyps.  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 She has been managed with bile acid sequestrants. Complains of diarrhea since on WelChol only once daily. Previously took one 3 times a day with good control of her diarrhea. She would like to increase her medication. Reviewed records it appears this may have been an inadvertant change on her discharge papers. She denies any melena or rectal bleeding. No abdominal pain. Her swallowing has improved since recent esophageal dilation. With regards to recent attempted colonoscopy, inadequate prep, she reports that she follow clear liquid diet as prescribed. She consumed half of the Trilytely prep but could not complete do to nausea. She took Linzess daily prior to procedure but did not stop her Welchol until day before TCS.     Current Outpatient Prescriptions  Medication Sig Dispense Refill  . ARIPiprazole (ABILIFY) 5 MG tablet Take 1 tablet (5 mg total) by mouth daily.  30 tablet  2  . aspirin 81 MG chewable tablet Chew 81 mg by mouth daily.      . Cholecalciferol (VITAMIN D-3) 1000 UNITS CAPS Take 1 capsule by mouth daily.      . colesevelam (WELCHOL) 625 MG tablet Take 625 mg by mouth daily.      . DULoxetine (CYMBALTA) 60 MG capsule Take 1 capsule (60 mg total) by mouth daily.  30 capsule  2  . fish oil-omega-3 fatty acids 1000 MG capsule Take 2 g by mouth daily.        Marland Kitchen LANTUS SOLOSTAR 100 UNIT/ML Solostar Pen 20 Units at  bedtime.      Marland Kitchen levothyroxine (SYNTHROID, LEVOTHROID) 200 MCG tablet Take 200 mcg by mouth daily before breakfast.      . LINZESS 145 MCG CAPS capsule TAKE ONE CAPSULE BY MOUTH DAILY  30 capsule  5  . lisinopril (PRINIVIL,ZESTRIL) 20 MG tablet Take 20 mg by mouth daily.        . Melatonin 3 MG TABS Take 1 tablet by mouth at bedtime as needed (insomnia).      . metFORMIN (GLUCOPHAGE) 500 MG tablet Take 500 mg by mouth 2 (two) times daily.      . metoprolol tartrate (LOPRESSOR) 12.5 mg TABS tablet Take 0.5 tablets (12.5 mg total) by mouth 2 (two) times daily.  30 tablet  6  . pravastatin (PRAVACHOL) 40 MG tablet Take 40 mg by mouth daily.        . traMADol (ULTRAM) 50 MG tablet Take 50 mg by mouth 3 (three) times daily.      . traZODone (DESYREL) 100 MG tablet Take 1 tablet (100 mg total) by mouth at bedtime.  30 tablet  2   No current facility-administered medications for this visit.    Allergies as of 05/11/2014 - Review Complete 05/11/2014  Allergen Reaction Noted  . Ciprofloxacin  02/04/2007  . Iohexol  02/14/2005  . Latex  03/31/2014  . Neomycin-bacitracin zn-polymyx  02/04/2007  . Penicillins  02/04/2007  . Povidone-iodine  02/04/2007  .  Tape  03/31/2014    Past Medical History  Diagnosis Date  . Diabetes mellitus   . COPD (chronic obstructive pulmonary disease)   . ASCVD (arteriosclerotic cardiovascular disease)     MI in 96 requiring BMS CX; DES to M1 in 2000;normal coronary angiography in 2004  . Hyperlipidemia   . Hypothyroidism   . Tobacco abuse   . Depression   . DVT (deep venous thrombosis)   . Nephrolithiasis 2006    stone extraction   . Amputation of hand, right     traumatic  . Allergic rhinitis   . Cholelithiasis   . GERD (gastroesophageal reflux disease)   . Low back pain   . Peripheral neuropathy   . Diabetes mellitus type I   . DDD (degenerative disc disease), lumbar   . Sciatic pain     right  . Tremor     Past Surgical History  Procedure  Laterality Date  . Dilation and curettage of uterus  1974  . Partial hysterectomy  1978  . Total abdominal hysterectomy w/ bilateral salpingoophorectomy  2002  . Shoulder surgery      Left shoulder for RTC;left arm surgery '98/left hand surgery 2001  . Cholecystectomy    . Umbilical hernia repair  2008  . Colonoscopy  01/2009    VEL:FYBOFB rectum/repeat in 5 yrs  . Back surgery    . Hand amputation Right     traumatic  . Esophagogastroduodenoscopy  05/2010    Dr. Tessie Fass, erosion. 34F dilation  . Colonoscopy N/A 04/29/2014    Dr.Rourk- attempted/incomplete colonoscopy. inadequate prep  . Esophagogastroduodenoscopy N/A 04/29/2014    Dr.Rourk- normal esophagus s/p passage of maloney dilator. small hiatal hernia- bx= chronic inflammation.  Venia Minks dilation N/A 04/29/2014    Procedure: Venia Minks DILATION;  Surgeon: Daneil Dolin, MD;  Location: [facility];  Service: Endoscopy;  Laterality: N/A;    Family History  Problem Relation Age of Onset  . Depression Mother   . Bipolar disorder Mother   . Dementia Mother   . Alcohol abuse Father   . Aneurysm Father     deceased age 60, brain  . Pulmonary fibrosis Mother   . Colon cancer Paternal Grandfather     age greater than 47    History   Social History  . Marital Status: Married    Spouse Name: N/A    Number of Children: N/A  . Years of Education: N/A   Occupational History  . 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 Smoking status: Current Every Day Smoker -- 0.50 packs/day for 40 years    Types: Cigarettes  . Smokeless tobacco: Not on file     Comment: 1/2 pack per day  . Alcohol Use: No  . Drug Use: No  . Sexual Activity: No   Other Topics Concern  . Not on file   Social History Narrative   Married x3   Lives with husband   GED in 2005      ROS:  General: Negative for anorexia, weight loss, fever, chills, fatigue, weakness. Eyes: Negative for vision  changes.  ENT: Negative for hoarseness, difficulty swallowing , nasal congestion. CV: Negative for chest pain, angina, palpitations, dyspnea on exertion, peripheral edema.  Respiratory: Negative for dyspnea at rest, dyspnea on exertion, cough, sputum, wheezing.  GI: See history of present illness. GU:  Negative for dysuria, hematuria, urinary incontinence, urinary frequency, nocturnal urination.  MS: Negative for joint pain, low back pain.  Derm: Negative for rash or itching.  Neuro: Negative for weakness, abnormal sensation, seizure, frequent headaches, memory loss, confusion.  Psych: Negative for anxiety, depression, suicidal ideation, hallucinations.  Endo: Negative for unusual weight change.  Heme: Negative for bruising or bleeding. Allergy: Negative for rash or hives.    Physical Examination:  BP 132/76  Pulse 78  Temp(Src) 98 F (36.7 C) (Oral)  Ht 5' 6.5" (1.689 m)  Wt 163 lb (73.936 kg)  BMI 25.92 kg/m2   General: Well-nourished, well-developed in no acute distress.  Head: Normocephalic, atraumatic.   Eyes: Conjunctiva pink, no icterus. Mouth: Oropharyngeal mucosa moist and pink , no lesions erythema or exudate. Neck: Supple without thyromegaly, masses, or lymphadenopathy.  Lungs: Clear to auscultation bilaterally.  Heart: Regular rate and rhythm, no murmurs rubs or gallops.  Abdomen: Bowel sounds are normal, nontender, nondistended, no hepatosplenomegaly or masses, no abdominal bruits or    hernia , no rebound or guarding.   Rectal: not performed Extremities: No lower extremity edema. No clubbing or deformities.  Neuro: Alert and oriented x 4 , grossly normal neurologically.  Skin: Warm and dry, no rash or jaundice.   Psych: Alert and cooperative, normal mood and affect.

## 2014-05-11 NOTE — Assessment & Plan Note (Signed)
Recent failed attempted colonoscopy due to inadequate bowel prep. Patient did not complete Trilytely due to nausea. Modified two-day bowel prep provided. She will hold WelChol for one week prior to colonoscopy. If she is not having daily bowel movements, she should take Linzess daily one week leading up to bowel prep. Samples provided.  I have discussed the risks, alternatives, benefits with regards to but not limited to the risk of reaction to medication, bleeding, infection, perforation and the patient is agreeable to proceed. Written consent to be obtained.

## 2014-05-12 ENCOUNTER — Other Ambulatory Visit: Payer: Self-pay | Admitting: Internal Medicine

## 2014-05-12 DIAGNOSIS — Z860100 Personal history of colon polyps, unspecified: Secondary | ICD-10-CM

## 2014-05-12 DIAGNOSIS — K529 Noninfective gastroenteritis and colitis, unspecified: Secondary | ICD-10-CM

## 2014-05-12 MED ORDER — PEG 3350-KCL-NA BICARB-NACL 420 G PO SOLR: 4000.0000 mL | ORAL | Status: DC

## 2014-05-12 NOTE — Progress Notes (Signed)
INSTRUCTIONS HAVE BEEN MAILED TO [facility] ALONG WITH PREP INSTRUCTIONS

## 2014-05-12 NOTE — Progress Notes (Signed)
Discussed with Dr. Gala Romney. Bowel prep should consist of the following for colonoscopy: 2 days clear liquids. Hold Welchol for 7 days before procedure.  She can use Linzess once daily week before TCS if not having good BMs. Give split dose bowel prep (trilytely), 2 liters day before, 2 liters morning of.  Day before TCS: Lantus 10U at bedtime, metformin 225m BID  Please schedule.

## 2014-05-13 NOTE — Progress Notes (Signed)
cc'd to pcp 

## 2014-05-14 ENCOUNTER — Encounter (HOSPITAL_COMMUNITY): Payer: Self-pay | Admitting: Pharmacy Technician

## 2014-05-27 ENCOUNTER — Encounter (HOSPITAL_COMMUNITY)
Admission: RE | Disposition: A | Discharge status: Home / Self Care | Payer: Self-pay | Source: Ambulatory Visit | Attending: Internal Medicine

## 2014-05-27 ENCOUNTER — Encounter (HOSPITAL_COMMUNITY): Payer: Self-pay | Admitting: *Deleted

## 2014-05-27 ENCOUNTER — Ambulatory Visit (HOSPITAL_COMMUNITY)
Admission: RE | Admit: 2014-05-27 | Discharge: 2014-05-27 | Disposition: A | Discharge status: Home / Self Care | Payer: Medicare HMO | Source: Ambulatory Visit | Attending: Internal Medicine | Admitting: Internal Medicine

## 2014-05-27 DIAGNOSIS — S68419A Complete traumatic amputation of unspecified hand at wrist level, initial encounter: Secondary | ICD-10-CM | POA: Diagnosis not present

## 2014-05-27 DIAGNOSIS — E785 Hyperlipidemia, unspecified: Secondary | ICD-10-CM | POA: Insufficient documentation

## 2014-05-27 DIAGNOSIS — Z860100 Personal history of colon polyps, unspecified: Secondary | ICD-10-CM

## 2014-05-27 DIAGNOSIS — G609 Hereditary and idiopathic neuropathy, unspecified: Secondary | ICD-10-CM | POA: Diagnosis not present

## 2014-05-27 DIAGNOSIS — E109 Type 1 diabetes mellitus without complications: Secondary | ICD-10-CM | POA: Diagnosis not present

## 2014-05-27 DIAGNOSIS — E039 Hypothyroidism, unspecified: Secondary | ICD-10-CM | POA: Diagnosis not present

## 2014-05-27 DIAGNOSIS — Z79899 Other long term (current) drug therapy: Secondary | ICD-10-CM | POA: Insufficient documentation

## 2014-05-27 DIAGNOSIS — R197 Diarrhea, unspecified: Secondary | ICD-10-CM | POA: Insufficient documentation

## 2014-05-27 DIAGNOSIS — F329 Major depressive disorder, single episode, unspecified: Secondary | ICD-10-CM | POA: Diagnosis not present

## 2014-05-27 DIAGNOSIS — F3289 Other specified depressive episodes: Secondary | ICD-10-CM | POA: Insufficient documentation

## 2014-05-27 DIAGNOSIS — F17200 Nicotine dependence, unspecified, uncomplicated: Secondary | ICD-10-CM | POA: Insufficient documentation

## 2014-05-27 DIAGNOSIS — Z7982 Long term (current) use of aspirin: Secondary | ICD-10-CM | POA: Diagnosis not present

## 2014-05-27 DIAGNOSIS — K529 Noninfective gastroenteritis and colitis, unspecified: Secondary | ICD-10-CM

## 2014-05-27 DIAGNOSIS — D126 Benign neoplasm of colon, unspecified: Secondary | ICD-10-CM

## 2014-05-27 HISTORY — PX: COLONOSCOPY: SHX5424

## 2014-05-27 LAB — GLUCOSE, CAPILLARY: GLUCOSE-CAPILLARY: 233 mg/dL — AB (ref 70–99)

## 2014-05-27 SURGERY — COLONOSCOPY
Anesthesia: Moderate Sedation

## 2014-05-27 MED ORDER — SODIUM CHLORIDE 0.9 % IV SOLN
INTRAVENOUS | Status: DC
  Administered 2014-05-27: 10:00:00 via INTRAVENOUS

## 2014-05-27 MED ORDER — ONDANSETRON HCL 4 MG/2ML IJ SOLN
INTRAMUSCULAR | Status: DC | PRN
  Administered 2014-05-27: 4 mg via INTRAVENOUS

## 2014-05-27 MED ORDER — SIMETHICONE 40 MG/0.6ML PO SUSP
ORAL | Status: DC | PRN
Start: 2014-05-27 — End: 2014-05-27
  Administered 2014-05-27: 12:00:00

## 2014-05-27 MED ORDER — MEPERIDINE HCL 100 MG/ML IJ SOLN
INTRAMUSCULAR | Status: AC
  Filled 2014-05-27: qty 2

## 2014-05-27 MED ORDER — ONDANSETRON HCL 4 MG/2ML IJ SOLN
INTRAMUSCULAR | Status: AC
  Filled 2014-05-27: qty 2

## 2014-05-27 MED ORDER — MIDAZOLAM HCL 5 MG/5ML IJ SOLN
INTRAMUSCULAR | Status: AC
  Filled 2014-05-27: qty 10

## 2014-05-27 MED ORDER — MIDAZOLAM HCL 5 MG/5ML IJ SOLN
INTRAMUSCULAR | Status: DC | PRN
Start: 2014-05-27 — End: 2014-05-27
  Administered 2014-05-27: 1 mg via INTRAVENOUS
  Administered 2014-05-27: 2 mg via INTRAVENOUS

## 2014-05-27 MED ORDER — MEPERIDINE HCL 100 MG/ML IJ SOLN
INTRAMUSCULAR | Status: DC | PRN
  Administered 2014-05-27: 50 mg via INTRAVENOUS

## 2014-05-27 NOTE — Discharge Instructions (Signed)
°Colonoscopy °Discharge Instructions ° °Read the instructions outlined below and refer to this sheet in the next few weeks. These discharge instructions provide you with general information on caring for yourself after you leave the hospital. Your doctor may also give you specific instructions. While your treatment has been planned according to the most current medical practices available, unavoidable complications occasionally occur. If you have any problems or questions after discharge, call Dr. Rourk at [phone]. °ACTIVITY °· You may resume your regular activity, but move at a slower pace for the next 24 hours.  °· Take frequent rest periods for the next 24 hours.  °· Walking will help get rid of the air and reduce the bloated feeling in your belly (abdomen).  °· No driving for 24 hours (because of the medicine (anesthesia) used during the test).   °· Do not sign any important legal documents or operate any machinery for 24 hours (because of the anesthesia used during the test).  °NUTRITION °· Drink plenty of fluids.  °· You may resume your normal diet as instructed by your doctor.  °· Begin with a light meal and progress to your normal diet. Heavy or fried foods are harder to digest and may make you feel sick to your stomach (nauseated).  °· Avoid alcoholic beverages for 24 hours or as instructed.  °MEDICATIONS °· You may resume your normal medications unless your doctor tells you otherwise.  °WHAT YOU CAN EXPECT TODAY °· Some feelings of bloating in the abdomen.  °· Passage of more gas than usual.  °· Spotting of blood in your stool or on the toilet paper.  °IF YOU HAD POLYPS REMOVED DURING THE COLONOSCOPY: °· No aspirin products for 7 days or as instructed.  °· No alcohol for 7 days or as instructed.  °· Eat a soft diet for the next 24 hours.  °FINDING OUT THE RESULTS OF YOUR TEST °Not all test results are available during your visit. If your test results are not back during the visit, make an appointment  with your caregiver to find out the results. Do not assume everything is normal if you have not heard from your caregiver or the medical facility. It is important for you to follow up on all of your test results.  °SEEK IMMEDIATE MEDICAL ATTENTION IF: °· You have more than a spotting of blood in your stool.  °· Your belly is swollen (abdominal distention).  °· You are nauseated or vomiting.  °· You have a temperature over 101.  °· You have abdominal pain or discomfort that is severe or gets worse throughout the day.  ° ° °Polyp information provided ° °Further recommendations to follow pending review of pathology report ° °Colon Polyps °Polyps are lumps of extra tissue growing inside the body. Polyps can grow in the large intestine (colon). Most colon polyps are noncancerous (benign). However, some colon polyps can become cancerous over time. Polyps that are larger than a pea may be harmful. To be safe, caregivers remove and test all polyps. °CAUSES  °Polyps form when mutations in the genes cause your cells to grow and divide even though no more tissue is needed. °RISK FACTORS °There are a number of risk factors that can increase your chances of getting colon polyps. They include: °· Being older than 50 years. °· Family history of colon polyps or colon cancer. °· Long-term colon diseases, such as colitis or Crohn disease. °· Being overweight. °· Smoking. °· Being inactive. °· Drinking too much alcohol. °SYMPTOMS  °  Most small polyps do not cause symptoms. If symptoms are present, they may include: °· Blood in the stool. The stool may look dark red or black. °· Constipation or diarrhea that lasts longer than 1 week. °DIAGNOSIS °People often do not know they have polyps until their caregiver finds them during a regular checkup. Your caregiver can use 4 tests to check for polyps: °· Digital rectal exam. The caregiver wears gloves and feels inside the rectum. This test would find polyps only in the rectum. °· Barium  enema. The caregiver puts a liquid called barium into your rectum before taking X-rays of your colon. Barium makes your colon look white. Polyps are dark, so they are easy to see in the X-ray pictures. °· Sigmoidoscopy. A thin, flexible tube (sigmoidoscope) is placed into your rectum. The sigmoidoscope has a light and tiny camera in it. The caregiver uses the sigmoidoscope to look at the last third of your colon. °· Colonoscopy. This test is like sigmoidoscopy, but the caregiver looks at the entire colon. This is the most common method for finding and removing polyps. °TREATMENT  °Any polyps will be removed during a sigmoidoscopy or colonoscopy. The polyps are then tested for cancer. °PREVENTION  °To help lower your risk of getting more colon polyps: °· Eat plenty of fruits and vegetables. Avoid eating fatty foods. °· Do not smoke. °· Avoid drinking alcohol. °· Exercise every day. °· Lose weight if recommended by your caregiver. °· Eat plenty of calcium and folate. Foods that are rich in calcium include milk, cheese, and broccoli. Foods that are rich in folate include chickpeas, kidney beans, and spinach. °HOME CARE INSTRUCTIONS °Keep all follow-up appointments as directed by your caregiver. You may need periodic exams to check for polyps. °SEEK MEDICAL CARE IF: °You notice bleeding during a bowel movement. °Document Released: 06/27/2004 Document Revised: 12/24/2011 Document Reviewed: 12/11/2011 °[employer]® Patient Information ©2015 [employer]. This information is not intended to replace advice given to you by your health care provider. Make sure you discuss any questions you have with your health care provider. ° °

## 2014-05-27 NOTE — H&P (View-Only) (Signed)
Primary Care Physician:  Maricela Curet, MD  Primary Gastroenterologist:  Garfield Cornea, MD   Chief Complaint  Patient presents with  . Reschedule colonoscopy    HPI:  [name] is a 63 y.o. female here to schedule repeat colonoscopy. Failed attempt back on 04/29/2014 due to inadequate prep. Formed stool noted in the rectum extending well into the sigmoid colon. Procedure scheduled as surveillance maneuver for history of adenomatous colon polyps.  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 She has been managed with bile acid sequestrants. Complains of diarrhea since on WelChol only once daily. Previously took one 3 times a day with good control of her diarrhea. She would like to increase her medication. Reviewed records it appears this may have been an inadvertant change on her discharge papers. She denies any melena or rectal bleeding. No abdominal pain. Her swallowing has improved since recent esophageal dilation. With regards to recent attempted colonoscopy, inadequate prep, she reports that she follow clear liquid diet as prescribed. She consumed half of the Trilytely prep but could not complete do to nausea. She took Linzess daily prior to procedure but did not stop her Welchol until day before TCS.     Current Outpatient Prescriptions  Medication Sig Dispense Refill  . ARIPiprazole (ABILIFY) 5 MG tablet Take 1 tablet (5 mg total) by mouth daily.  30 tablet  2  . aspirin 81 MG chewable tablet Chew 81 mg by mouth daily.      . Cholecalciferol (VITAMIN D-3) 1000 UNITS CAPS Take 1 capsule by mouth daily.      . colesevelam (WELCHOL) 625 MG tablet Take 625 mg by mouth daily.      . DULoxetine (CYMBALTA) 60 MG capsule Take 1 capsule (60 mg total) by mouth daily.  30 capsule  2  . fish oil-omega-3 fatty acids 1000 MG capsule Take 2 g by mouth daily.        Marland Kitchen LANTUS SOLOSTAR 100 UNIT/ML Solostar Pen 20 Units at  bedtime.      Marland Kitchen levothyroxine (SYNTHROID, LEVOTHROID) 200 MCG tablet Take 200 mcg by mouth daily before breakfast.      . LINZESS 145 MCG CAPS capsule TAKE ONE CAPSULE BY MOUTH DAILY  30 capsule  5  . lisinopril (PRINIVIL,ZESTRIL) 20 MG tablet Take 20 mg by mouth daily.        . Melatonin 3 MG TABS Take 1 tablet by mouth at bedtime as needed (insomnia).      . metFORMIN (GLUCOPHAGE) 500 MG tablet Take 500 mg by mouth 2 (two) times daily.      . metoprolol tartrate (LOPRESSOR) 12.5 mg TABS tablet Take 0.5 tablets (12.5 mg total) by mouth 2 (two) times daily.  30 tablet  6  . pravastatin (PRAVACHOL) 40 MG tablet Take 40 mg by mouth daily.        . traMADol (ULTRAM) 50 MG tablet Take 50 mg by mouth 3 (three) times daily.      . traZODone (DESYREL) 100 MG tablet Take 1 tablet (100 mg total) by mouth at bedtime.  30 tablet  2   No current facility-administered medications for this visit.    Allergies as of 05/11/2014 - Review Complete 05/11/2014  Allergen Reaction Noted  . Ciprofloxacin  02/04/2007  . Iohexol  02/14/2005  . Latex  03/31/2014  . Neomycin-bacitracin zn-polymyx  02/04/2007  . Penicillins  02/04/2007  . Povidone-iodine  02/04/2007  .  Tape  03/31/2014    Past Medical History  Diagnosis Date  . Diabetes mellitus   . COPD (chronic obstructive pulmonary disease)   . ASCVD (arteriosclerotic cardiovascular disease)     MI in 96 requiring BMS CX; DES to M1 in 2000;normal coronary angiography in 2004  . Hyperlipidemia   . Hypothyroidism   . Tobacco abuse   . Depression   . DVT (deep venous thrombosis)   . Nephrolithiasis 2006    stone extraction   . Amputation of hand, right     traumatic  . Allergic rhinitis   . Cholelithiasis   . GERD (gastroesophageal reflux disease)   . Low back pain   . Peripheral neuropathy   . Diabetes mellitus type I   . DDD (degenerative disc disease), lumbar   . Sciatic pain     right  . Tremor     Past Surgical History  Procedure  Laterality Date  . Dilation and curettage of uterus  1974  . Partial hysterectomy  1978  . Total abdominal hysterectomy w/ bilateral salpingoophorectomy  2002  . Shoulder surgery      Left shoulder for RTC;left arm surgery '98/left hand surgery 2001  . Cholecystectomy    . Umbilical hernia repair  2008  . Colonoscopy  01/2009    RCV:ELFYBO rectum/repeat in 5 yrs  . Back surgery    . Hand amputation Right     traumatic  . Esophagogastroduodenoscopy  05/2010    Dr. Tessie Fass, erosion. 22F dilation  . Colonoscopy N/A 04/29/2014    Dr.Rourk- attempted/incomplete colonoscopy. inadequate prep  . Esophagogastroduodenoscopy N/A 04/29/2014    Dr.Rourk- normal esophagus s/p passage of maloney dilator. small hiatal hernia- bx= chronic inflammation.  Venia Minks dilation N/A 04/29/2014    Procedure: Venia Minks DILATION;  Surgeon: Daneil Dolin, MD;  Location: [facility];  Service: Endoscopy;  Laterality: N/A;    Family History  Problem Relation Age of Onset  . Depression Mother   . Bipolar disorder Mother   . Dementia Mother   . Alcohol abuse Father   . Aneurysm Father     deceased age 40, brain  . Pulmonary fibrosis Mother   . Colon cancer Paternal Grandfather     age greater than 56    History   Social History  . Marital Status: Married    Spouse Name: N/A    Number of Children: N/A  . Years of Education: N/A   Occupational History  . 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 Smoking status: Current Every Day Smoker -- 0.50 packs/day for 40 years    Types: Cigarettes  . Smokeless tobacco: Not on file     Comment: 1/2 pack per day  . Alcohol Use: No  . Drug Use: No  . Sexual Activity: No   Other Topics Concern  . Not on file   Social History Narrative   Married x3   Lives with husband   GED in 2005      ROS:  General: Negative for anorexia, weight loss, fever, chills, fatigue, weakness. Eyes: Negative for vision  changes.  ENT: Negative for hoarseness, difficulty swallowing , nasal congestion. CV: Negative for chest pain, angina, palpitations, dyspnea on exertion, peripheral edema.  Respiratory: Negative for dyspnea at rest, dyspnea on exertion, cough, sputum, wheezing.  GI: See history of present illness. GU:  Negative for dysuria, hematuria, urinary incontinence, urinary frequency, nocturnal urination.  MS: Negative for joint pain, low back pain.  Derm: Negative for rash or itching.  Neuro: Negative for weakness, abnormal sensation, seizure, frequent headaches, memory loss, confusion.  Psych: Negative for anxiety, depression, suicidal ideation, hallucinations.  Endo: Negative for unusual weight change.  Heme: Negative for bruising or bleeding. Allergy: Negative for rash or hives.    Physical Examination:  BP 132/76  Pulse 78  Temp(Src) 98 F (36.7 C) (Oral)  Ht 5' 6.5" (1.689 m)  Wt 163 lb (73.936 kg)  BMI 25.92 kg/m2   General: Well-nourished, well-developed in no acute distress.  Head: Normocephalic, atraumatic.   Eyes: Conjunctiva pink, no icterus. Mouth: Oropharyngeal mucosa moist and pink , no lesions erythema or exudate. Neck: Supple without thyromegaly, masses, or lymphadenopathy.  Lungs: Clear to auscultation bilaterally.  Heart: Regular rate and rhythm, no murmurs rubs or gallops.  Abdomen: Bowel sounds are normal, nontender, nondistended, no hepatosplenomegaly or masses, no abdominal bruits or    hernia , no rebound or guarding.   Rectal: not performed Extremities: No lower extremity edema. No clubbing or deformities.  Neuro: Alert and oriented x 4 , grossly normal neurologically.  Skin: Warm and dry, no rash or jaundice.   Psych: Alert and cooperative, normal mood and affect.

## 2014-05-27 NOTE — Interval H&P Note (Signed)
History and Physical Interval Note:  05/27/2014 11:33 AM  [name]  has presented today for surgery, with the diagnosis of HISTORY OF COLON POLYPS, CHRONIC DIARRHEA  The various methods of treatment have been discussed with the patient and family. After consideration of risks, benefits and other options for treatment, the patient has consented to  Procedure(s) with comments: COLONOSCOPY (N/A) - 12:30-moved to [place] notified pt as a surgical intervention .  The patient's history has been reviewed, patient examined, no change in status, stable for surgery.  I have reviewed the patient's chart and labs.  Questions were answered to the patient's satisfaction.    No change. Colonoscopy per plan The risks, benefits, limitations, alternatives and imponderables have been reviewed with the patient. Questions have been answered. All parties are agreeable.  Manus Rudd

## 2014-05-27 NOTE — Op Note (Signed)
[facility] [address] [place], [address]   COLONOSCOPY PROCEDURE REPORT  PATIENT: [name], [name]  MR#:         [id number] BIRTHDATE: [birth year] , 71  yrs. old GENDER: Female ENDOSCOPIST: R.  Garfield Cornea, MD FACP [facility] REFERRED BY:  Lucia Gaskins, M.D. PROCEDURE DATE:  05/27/2014 PROCEDURE:     Colonoscopy with biopsy  INDICATIONS: Chronic diarrhea; history of colonic adenoma  INFORMED CONSENT:  The risks, benefits, alternatives and imponderables including but not limited to bleeding, perforation as well as the possibility of a missed lesion have been reviewed.  The potential for biopsy, lesion removal, etc. have also been discussed.  Questions have been answered.  All parties agreeable. Please see the history and physical in the medical record for more information.  MEDICATIONS: Versed 3 mg IV and Demerol 50 mg IV in divided doses. Zofran 4 mg IV  DESCRIPTION OF PROCEDURE:  After a digital rectal exam was performed, the EC-[id number] ([id number])  colonoscope was advanced from the anus through the rectum and colon to the area of the cecum, ileocecal valve and appendiceal orifice.  The cecum was deeply intubated.  These structures were well-seen and photographed for the record.  From the level of the cecum and ileocecal valve, the scope was slowly and cautiously withdrawn.  The mucosal surfaces were carefully surveyed utilizing scope tip deflection to facilitate fold flattening as needed.  The scope was pulled down into the rectum where a thorough examination including retroflexion was performed.    FINDINGS:  Adequate preparation. Normal rectum. Melanosis coli. (3) diminutive polyps clustered in the ascending segment; otherwise, the remainder of the colonic mucosa appeared normal.  THERAPEUTIC / DIAGNOSTIC MANEUVERS PERFORMED:   The above-mentioned polyps were cold biopsied/removed. Also, segmental biopsies of the ascending and descending/sigmoid  segments were taken to evaluate for microscopic colitis.  COMPLICATIONS: none  CECAL WITHDRAWAL TIME:  13 minutes  IMPRESSION:  Melanosis coli. Colonic polyps-removed as described above. Status post segmental biopsy.  RECOMMENDATIONS: Followup on path pathology.   _______________________________ eSigned:  R. Garfield Cornea, MD FACP [facility] 05/27/2014 12:06 PM   CC:

## 2014-05-28 ENCOUNTER — Encounter: Payer: Self-pay | Admitting: Internal Medicine

## 2014-05-31 ENCOUNTER — Encounter: Payer: Self-pay | Admitting: Internal Medicine

## 2014-06-01 ENCOUNTER — Encounter (HOSPITAL_COMMUNITY): Payer: Self-pay | Admitting: Internal Medicine

## 2014-06-07 ENCOUNTER — Ambulatory Visit (INDEPENDENT_AMBULATORY_CARE_PROVIDER_SITE_OTHER): Payer: Medicare HMO | Admitting: Psychiatry

## 2014-06-07 DIAGNOSIS — F329 Major depressive disorder, single episode, unspecified: Secondary | ICD-10-CM

## 2014-06-07 DIAGNOSIS — F3289 Other specified depressive episodes: Secondary | ICD-10-CM

## 2014-06-07 NOTE — Patient Instructions (Signed)
Discussed orally 

## 2014-06-07 NOTE — Progress Notes (Signed)
   THERAPIST PROGRESS NOTE  Session Time: Monday 06/07/2014 3:10 PM - 3:50 PM  Participation Level: Active  Behavioral Response: CasualAlertEuthymic  Type of Therapy: Individual Therapy  Treatment Goals addressed: Improve ability to manage stress  Interventions: Supportive  Summary: [name] is a 63 y.o. female who presents with a 20 year history of depression with at least one psychiatric admission. She is a returning patient to this clinician and resumed psychotherapy due to increased stress as her husband cheated on her last year while patient was recovering in a rehabilitation facility. Since her return home, patient has tried to discuss issue with husband but he refuses and denies he did anything wrong. She also expresses frustration and worry as husband spends his money on alcohol and then does not have enough money to pay their expenses. Patient wasexperiencing periods of depressed mood, anxiety, sleep difficulty.  Since her las session 4 months ago, patient reports improved mood, decreased worry, and absence of crying spells. She states she is coping with her situation well. She has been assertive with husband and set boundaries. They now live as roommates and he takes her to her appointments and to do errands. She reports continued hurt feelings about his betrayal but denies any anger. She has decided to stay in marriage for economic reasons although her son has offered her to move in with him and his girlfriend. She doesn't think the arrangement would work but knows she is welcomed and is thankful for son's concern. She does have overnight visits at her son's home. Patient has resumed normal interest in activities and has strong emotional support from her son, church family, friends,and neighbors. She sees her grandchildren regularly and is looking forward to attending granddaughter's wedding in September. Patient maintains involvement in activity and reports positive self-care.  She  is seeing her PCP for medication management.    Suicidal/Homicidal: No  Therapist Response: Therapist works with patient to process feelings, review progress, identify ways to use support system, and review coping techniques.  Plan: Patient and therapist agree to discontinue therapy as patient is coping well and feels positive about her progress. Patient agrees to continue working with PCP. Patient is encouraged to call this practice should she need therapy in the future.  Diagnosis: Axis I: Depressive Disorder NOS    Axis II: No diagnosis    [name] Ronda, LCSW 06/07/2014        Outpatient Therapist Discharge Summary  [name]    August 17, 1951   Admission Date: 08/10/2010  Discharge Date:  06/07/2014  Reason for Discharge:  Treatment completed  Diagnosis:  Axis I:  Depressive disorder, not elsewhere classified   Axis V:  71-80  Comments:    [name] Swayze LCSW

## 2014-06-12 IMAGING — CT CT HEAD W/O CM
1 series · 16 of 30 positions shown, 20 images · non-contrast
Comparison: 12/27/2012

CLINICAL DATA: Weakness, dizziness, unresponsive, history diabetes,
COPD, smoking

EXAM:
CT HEAD WITHOUT CONTRAST
TECHNIQUE: Contiguous axial images were obtained from the base of the skull
through the vertex without intravenous contrast.

[Series 2: headseq 4.8 h37s · axial · 0.46mm/px · z∈[+399,+556]mm · 16 of 36 slices shown, 20 images]
[im 2/36  brain]
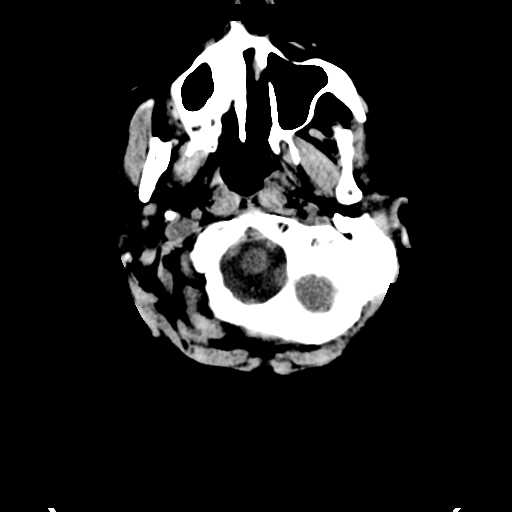
[im 2/36  bone]
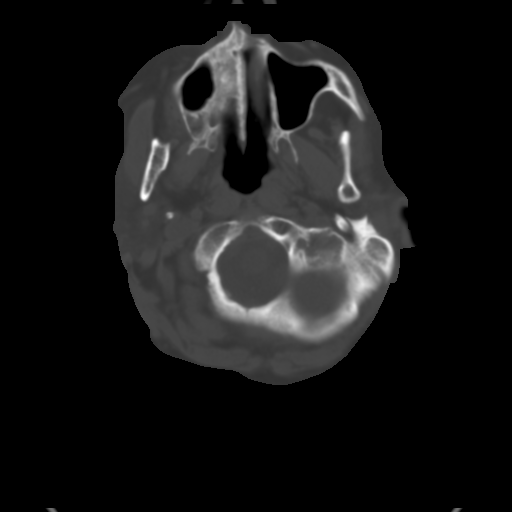
[im 4/36  brain]
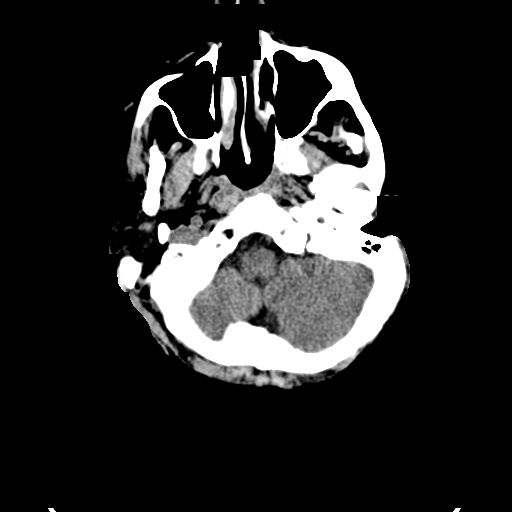
[im 7/36  brain]
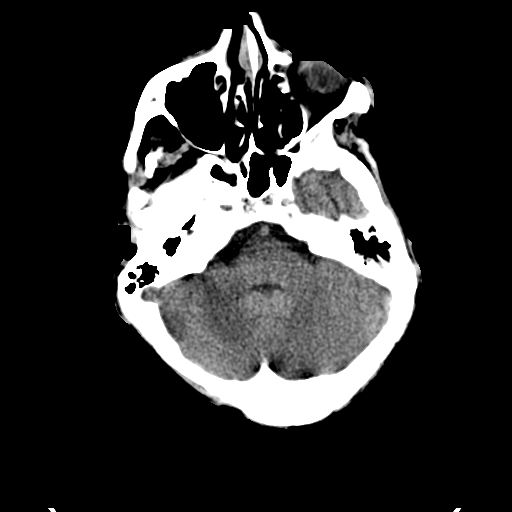
[im 9/36  brain]
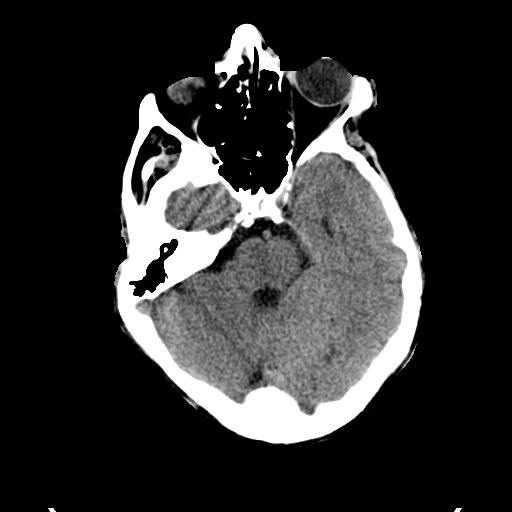
[im 10/36  brain]
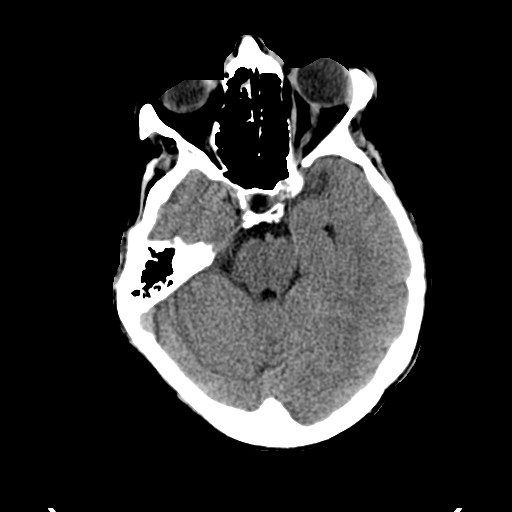
[im 10/36  bone]
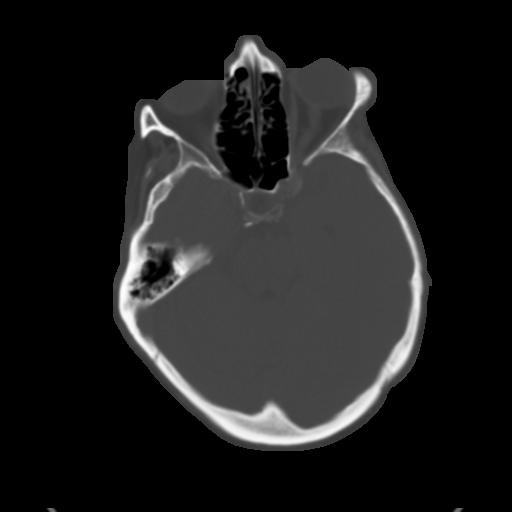
[im 13/36  brain]
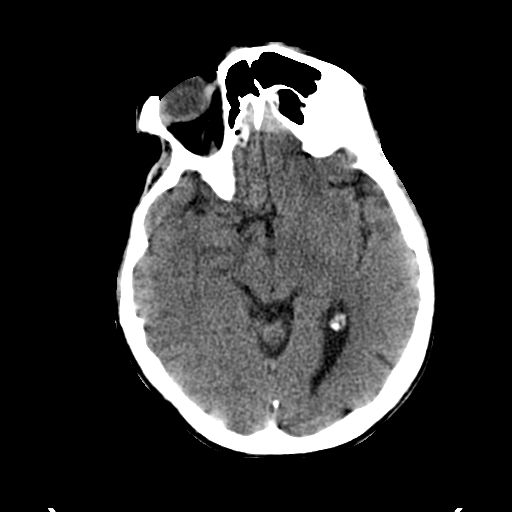
[im 15/36  brain]
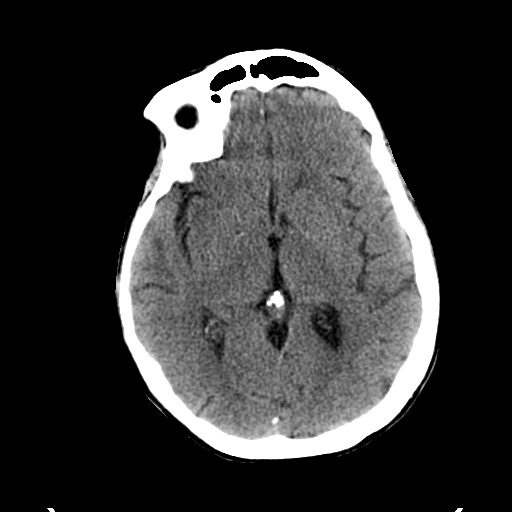
[im 17/36  brain]
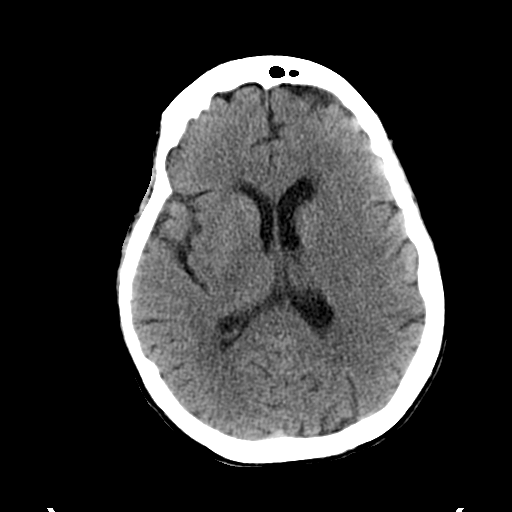
[im 19/36  brain]
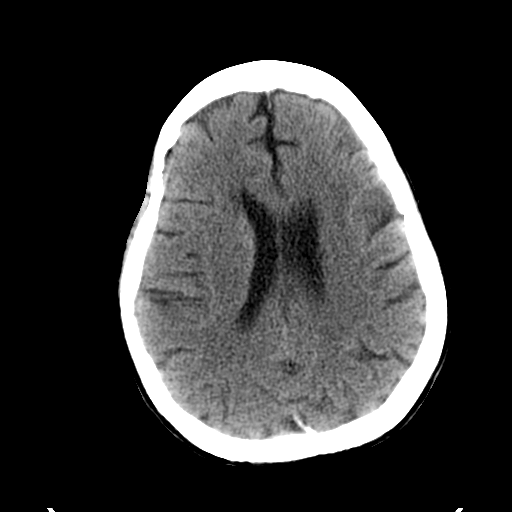
[im 19/36  bone]
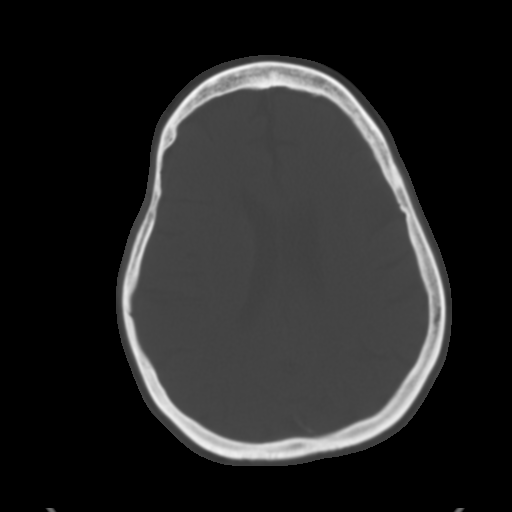
[im 21/36  brain]
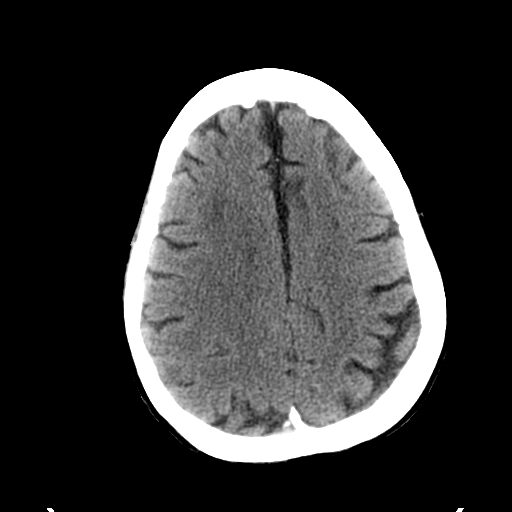
[im 23/36  brain]
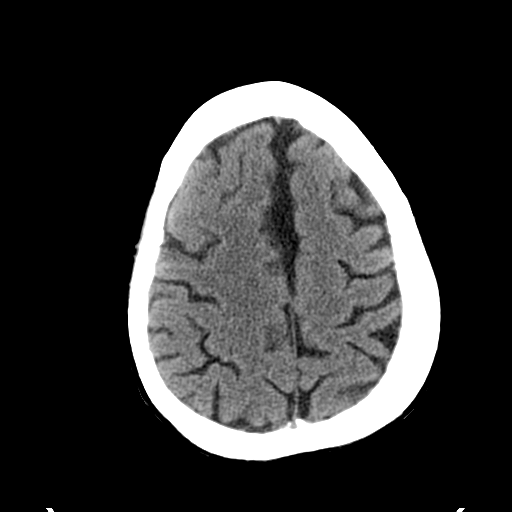
[im 26/36  brain]
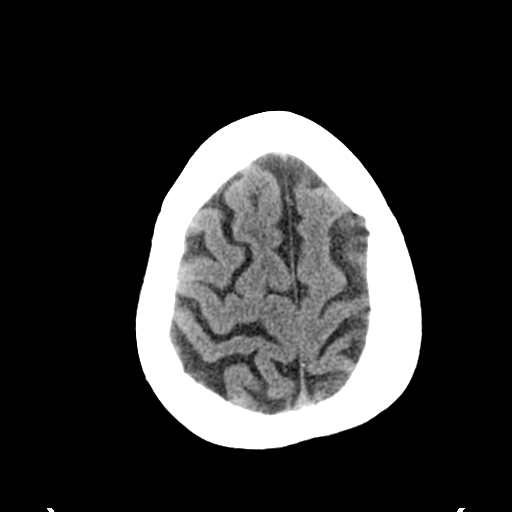
[im 27/36  brain]
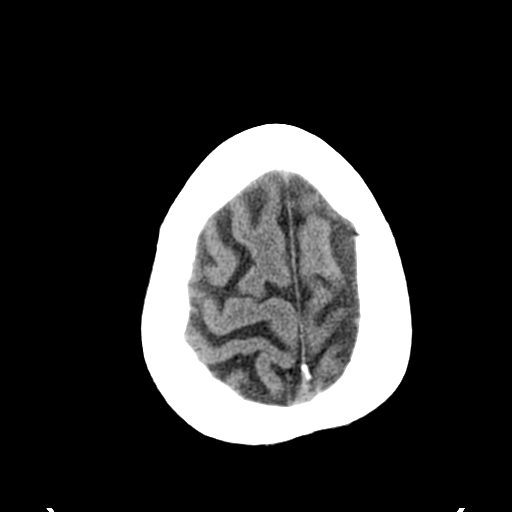
[im 27/36  bone]
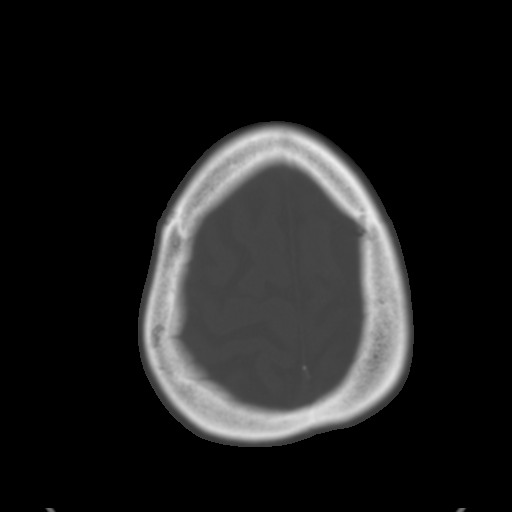
[im 29/36  brain]
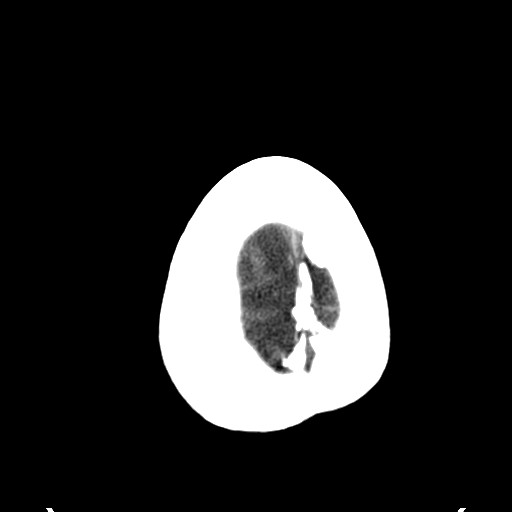
[im 32/36  brain]
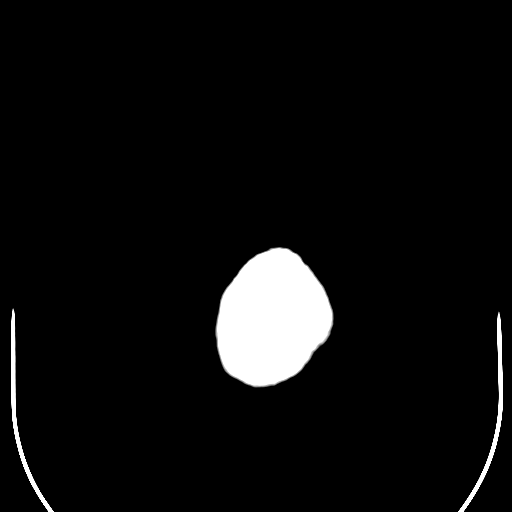
[im 34/36  brain]
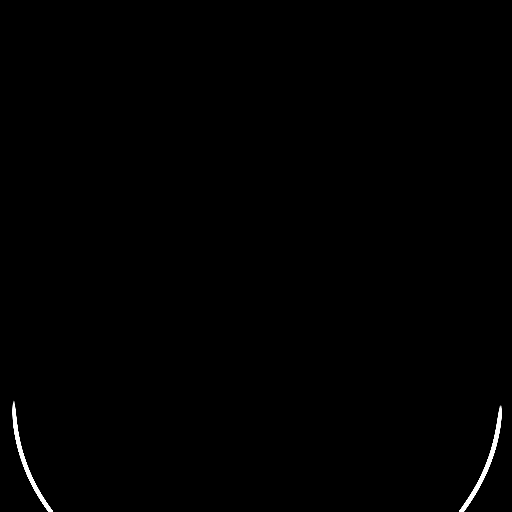

[16 of 30 positions shown; findings below may reference images not displayed]

FINDINGS: Minimal generalized atrophy.

Normal ventricular morphology.

No midline shift or mass effect.

Normal appearance of brain parenchyma otherwise identified.

No intracranial hemorrhage, mass lesion, or evidence acute
infarction.

No extra-axial fluid collections.

Few atherosclerotic calcifications in internal carotid arteries at
skullbase.

Bones and sinuses unremarkable.
IMPRESSION: No acute intracranial abnormalities.

## 2014-06-12 IMAGING — CR DG CHEST 1V PORT
1 series · 1 of 1 positions shown · non-contrast
Comparison: 01/13/2013

CLINICAL DATA: Weakness and dizziness

EXAM:
PORTABLE CHEST - 1 VIEW

[portable]
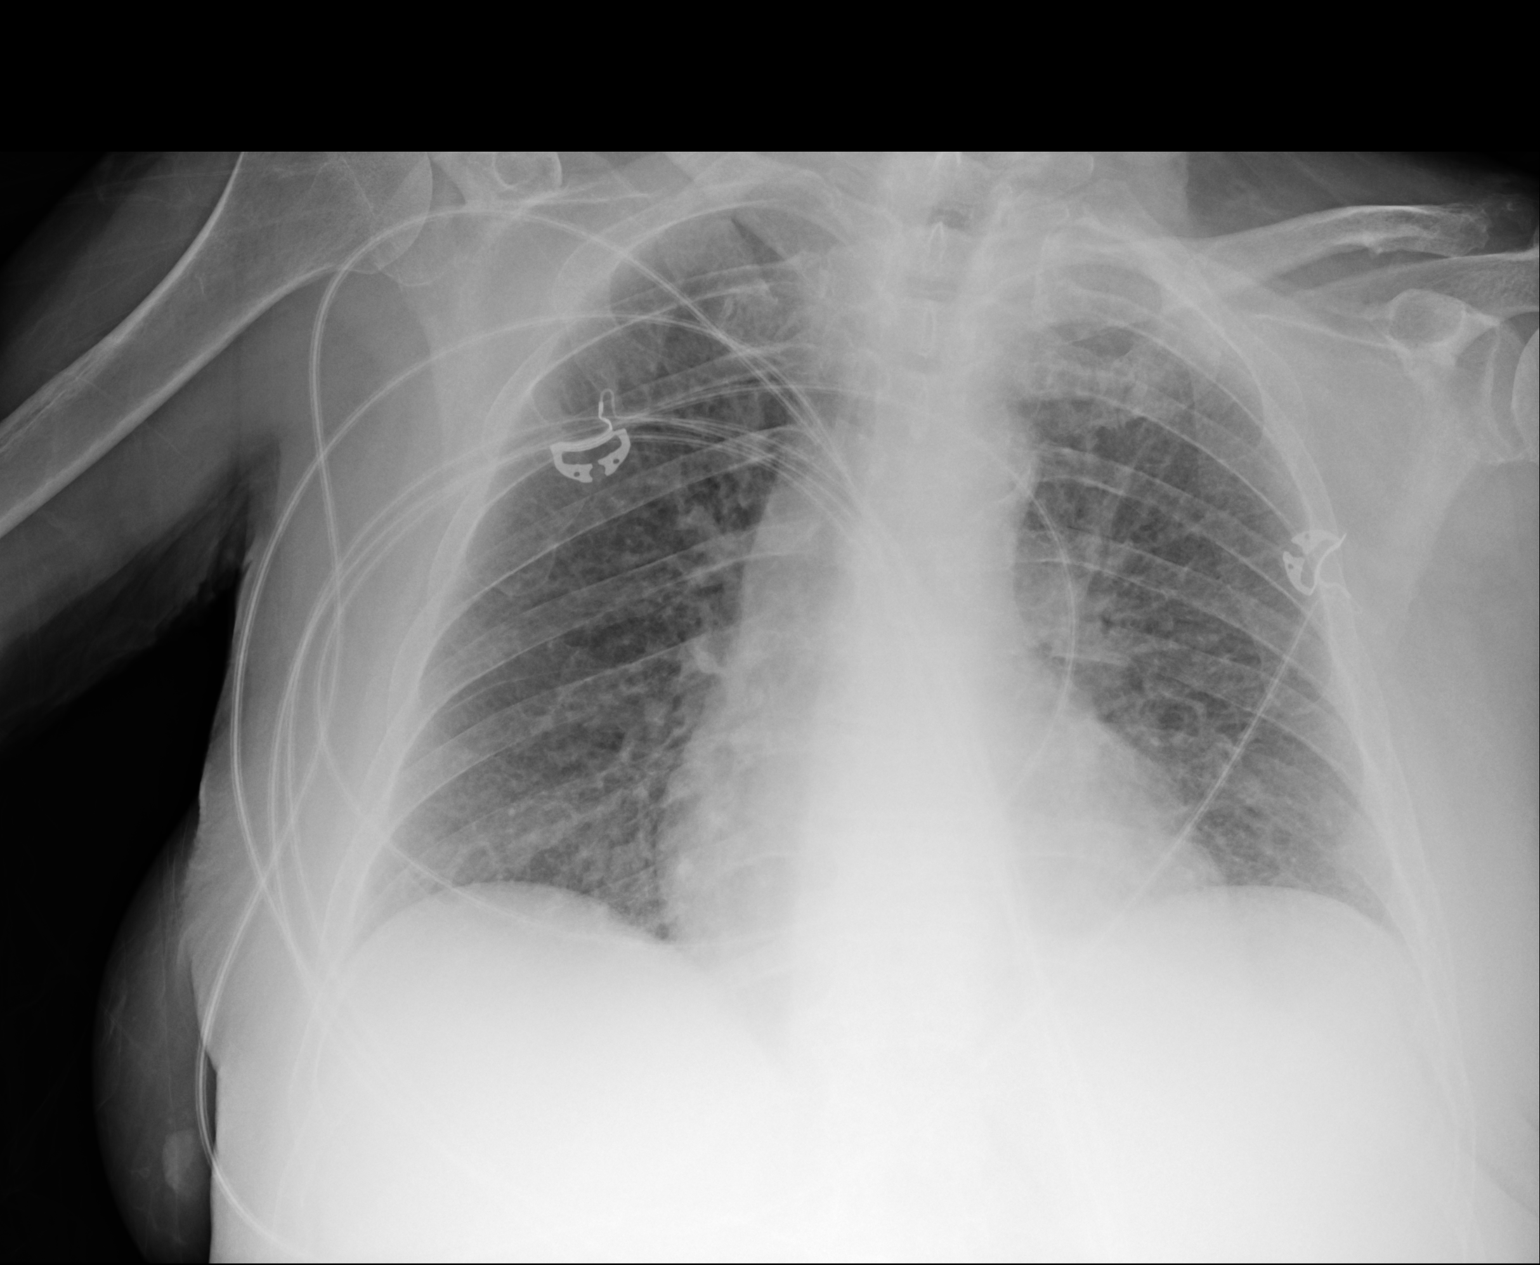

[1 of 1 positions shown; findings below may reference images not displayed]

FINDINGS: Mild cardiomegaly, similar to prior. Diffuse interstitial
coarsening, less severe than previously. No asymmetric opacity or
visible effusion. No pneumothorax. Postsurgical changes to the
distal left clavicle.
IMPRESSION: Mild pulmonary edema.

## 2014-07-05 ENCOUNTER — Ambulatory Visit: Payer: Self-pay | Admitting: Gastroenterology

## 2014-07-05 ENCOUNTER — Encounter: Payer: Self-pay | Admitting: Gastroenterology

## 2014-07-15 IMAGING — CT CT ABD-PELV W/O CM
2 of 4 series · 16 of 46 positions shown, 18 images · non-contrast
Comparison: 12/26/2012

CLINICAL DATA: Abdominal pain

EXAM:
CT ABDOMEN AND PELVIS WITHOUT CONTRAST
TECHNIQUE: Multidetector CT imaging of the abdomen and pelvis was performed
following the standard protocol without intravenous contrast.

[Series 2: abdomen/pelvis w/o contrast · axial · non-contrast · 0.78mm/px · z∈[-473,-43]mm · 13 of 100 slices shown, 15 images]
[im 7/100  soft-tissue]
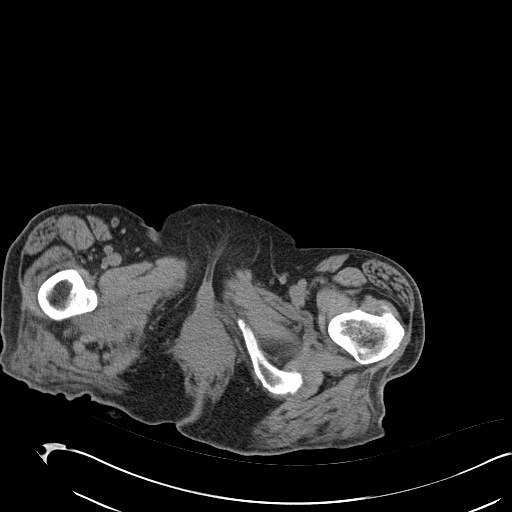
[im 7/100  bone]
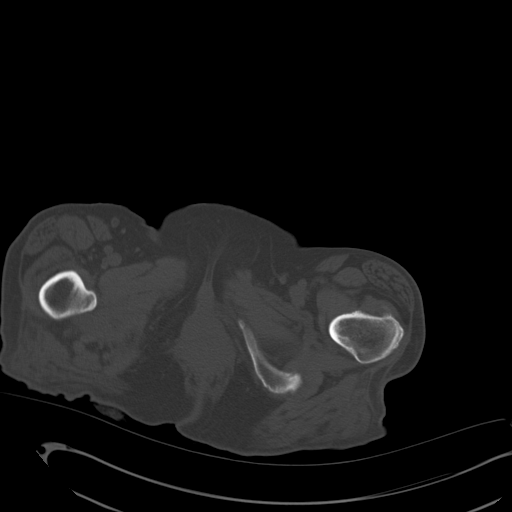
[im 14/100  soft-tissue]
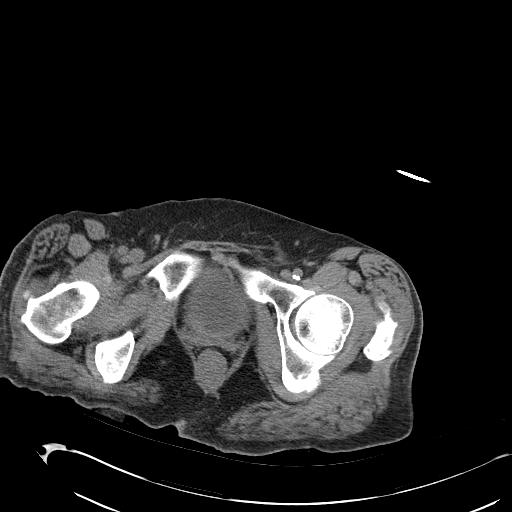
[im 20/100  soft-tissue]
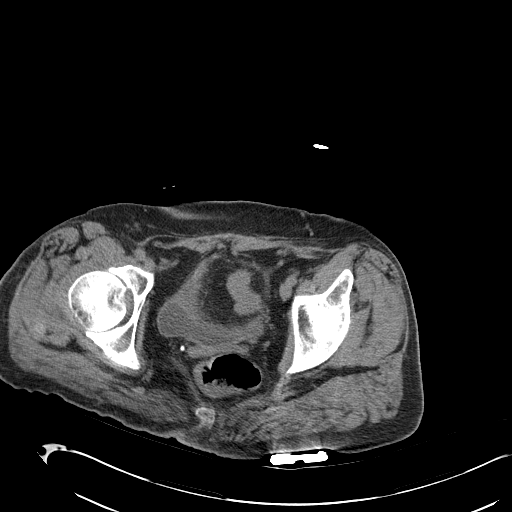
[im 27/100  soft-tissue]
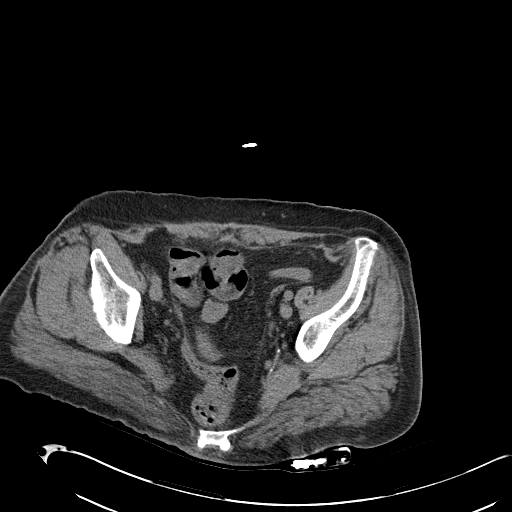
[im 34/100  soft-tissue]
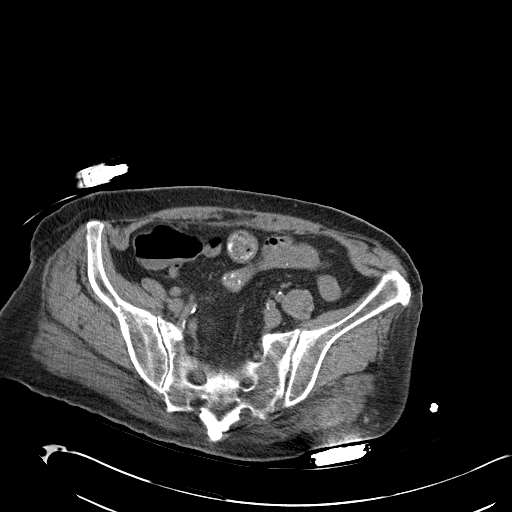
[im 40/100  soft-tissue]
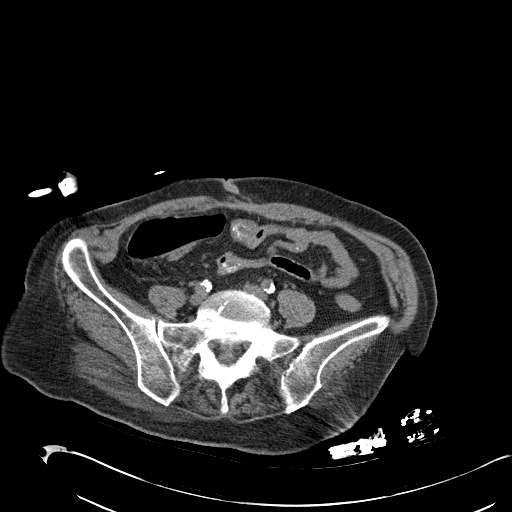
[im 53/100  soft-tissue]
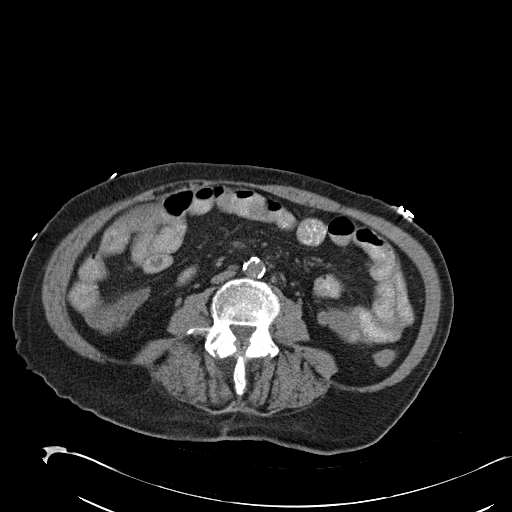
[im 60/100  soft-tissue]
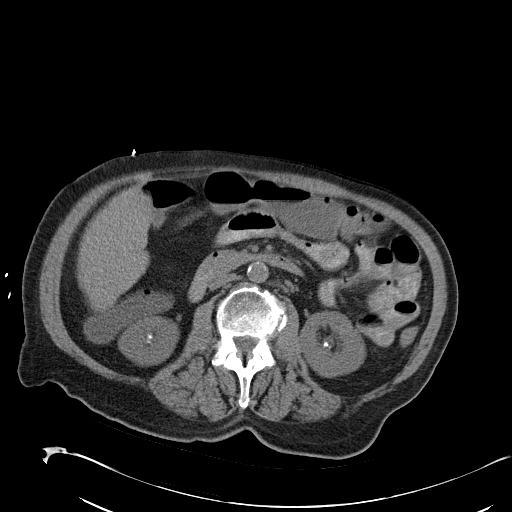
[im 67/100  soft-tissue]
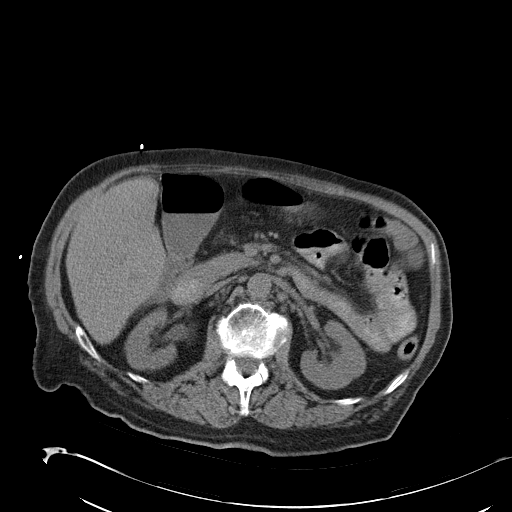
[im 67/100  bone]
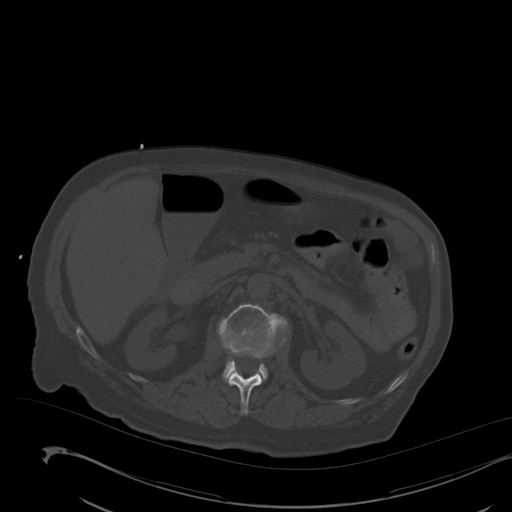
[im 73/100  soft-tissue]
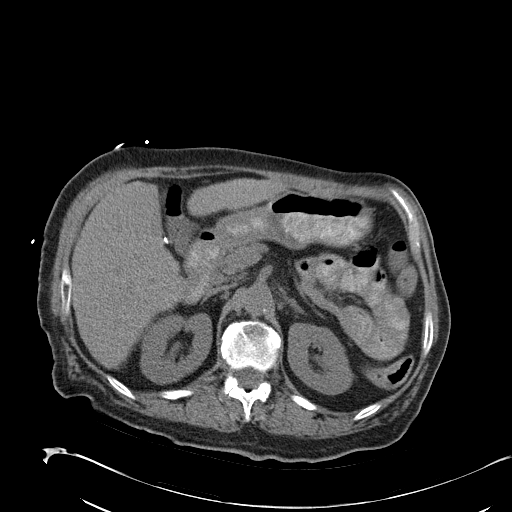
[im 80/100  soft-tissue]
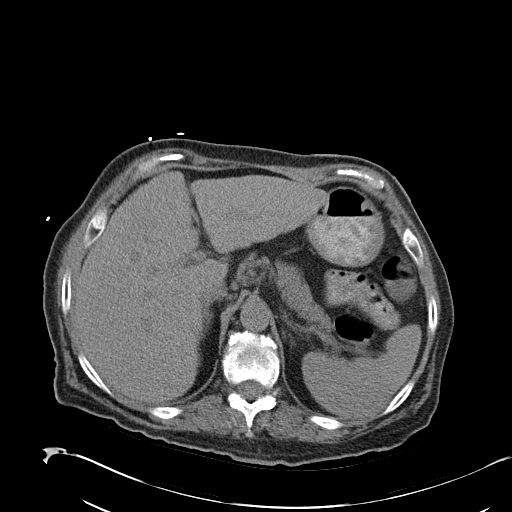
[im 86/100  soft-tissue]
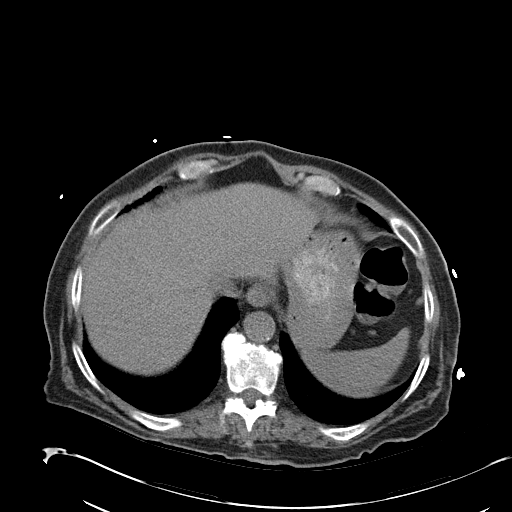
[im 93/100  soft-tissue]
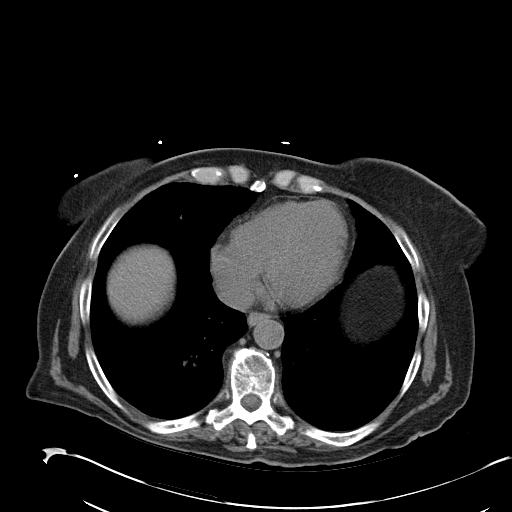

[Series 4: mpr cor (id) · coronal · 0.98mm/px · 3 of 72 slices shown]
[im 24/72  soft-tissue]
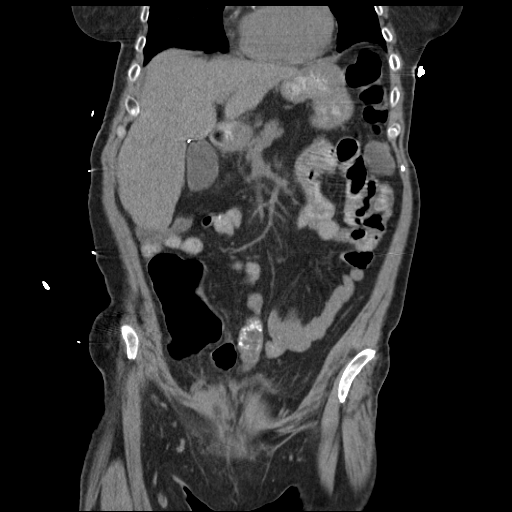
[im 32/72  soft-tissue]
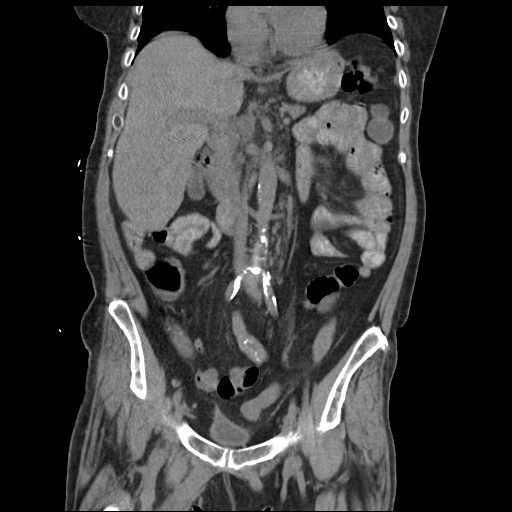
[im 40/72  soft-tissue]
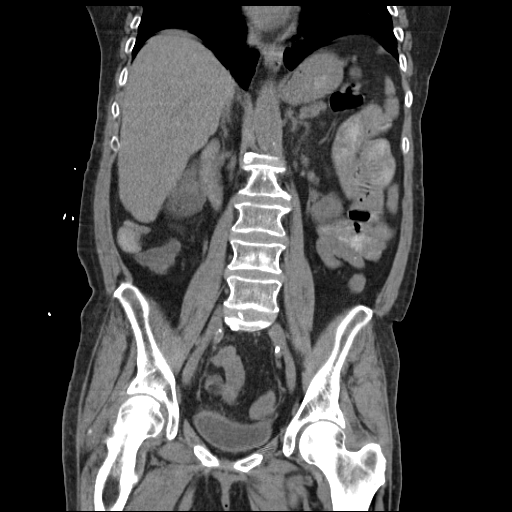

[16 of 46 positions shown; findings below may reference images not displayed]

FINDINGS: Mild vague ill-defined area of residual density projects within the
anterior aspect of the right lung base when compared to the previous
study. There is prominence of the interstitial markings.

Noncontrast evaluation of the liver, spleen, adrenals, pancreas is
unremarkable. Multiple bilateral nonobstructing medullary calculi
appreciated appears stable. There is no evidence of hydronephrosis,
hydroureter, nor a ureteral lithiasis. Patient is status
postcholecystectomy.

There is no evidence of bowel obstruction, enteritis, colitis,
diverticulitis, nor secondary signs reflecting appendicitis. The
appendix is identified and is unremarkable.

There is no evidence of abdominal aortic aneurysm. Atherosclerotic
calcifications identified within the abdominal aorta and iliac
vessels. Patient is status post cholecystectomy.

Within the limitations of a noncontrast CT there is no evidence of
abdominal or pelvic free fluid, loculated fluid collections, masses,
nor adenopathy.

There is no evidence of abdominal wall nor inguinal hernia.

Multilevel degenerative changes identified within the thoracic and
lumbar spine.
IMPRESSION: 1. Mild ill-defined area of increased density within the right lung
base may represent an area of fibrosis considering the findings on
previous CT dated [DATE] 9866. Alternatively a region of persistent
mild chronic infiltrate if clinically appropriate cannot be
excluded. Mild to moderate interstitial changes identified within
the lung bases likely representing areas of pulmonary fibrosis.
2. Stable bilateral nonobstructing medullary calculi.
3. There is otherwise no evidence of obstructive or inflammatory
abnormalities within the limitations of a noncontrast CT.
4. Atherosclerotic calcifications within the aorta and mesenteric
vessels.

## 2014-07-17 IMAGING — CR DG CERVICAL SPINE COMPLETE 4+V
6 series · 6 of 6 positions shown · non-contrast
Comparison: Cervical spine radiographs and CT 07/06/2012

CLINICAL DATA: Fall with right-sided posterior neck pain.

EXAM:
CERVICAL SPINE  4+ VIEWS

[view not recorded (1 of 6)]
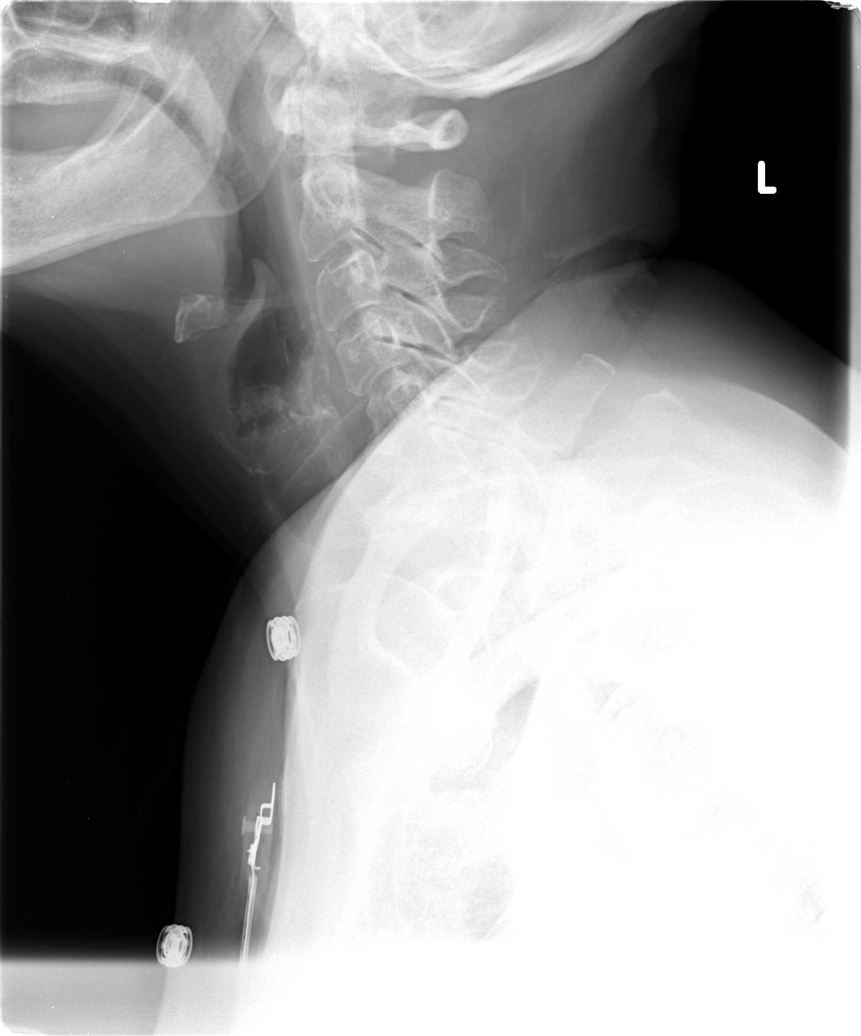

[view not recorded (2 of 6)]
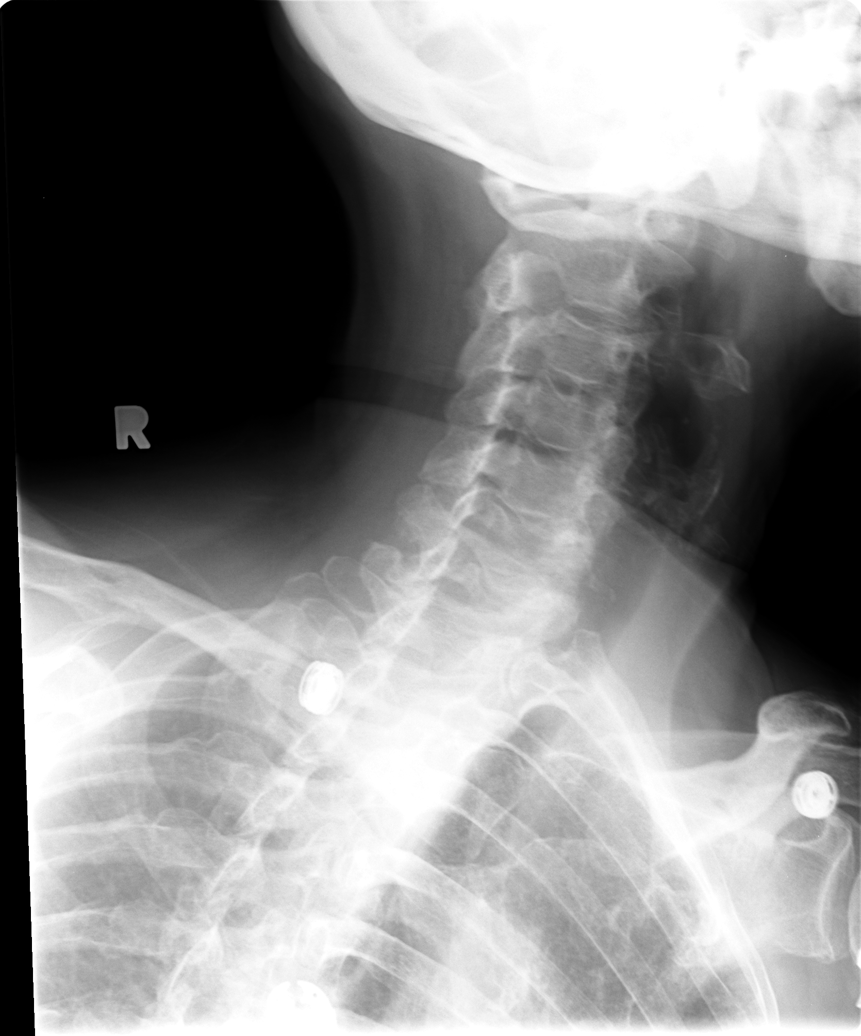

[view not recorded (3 of 6)]
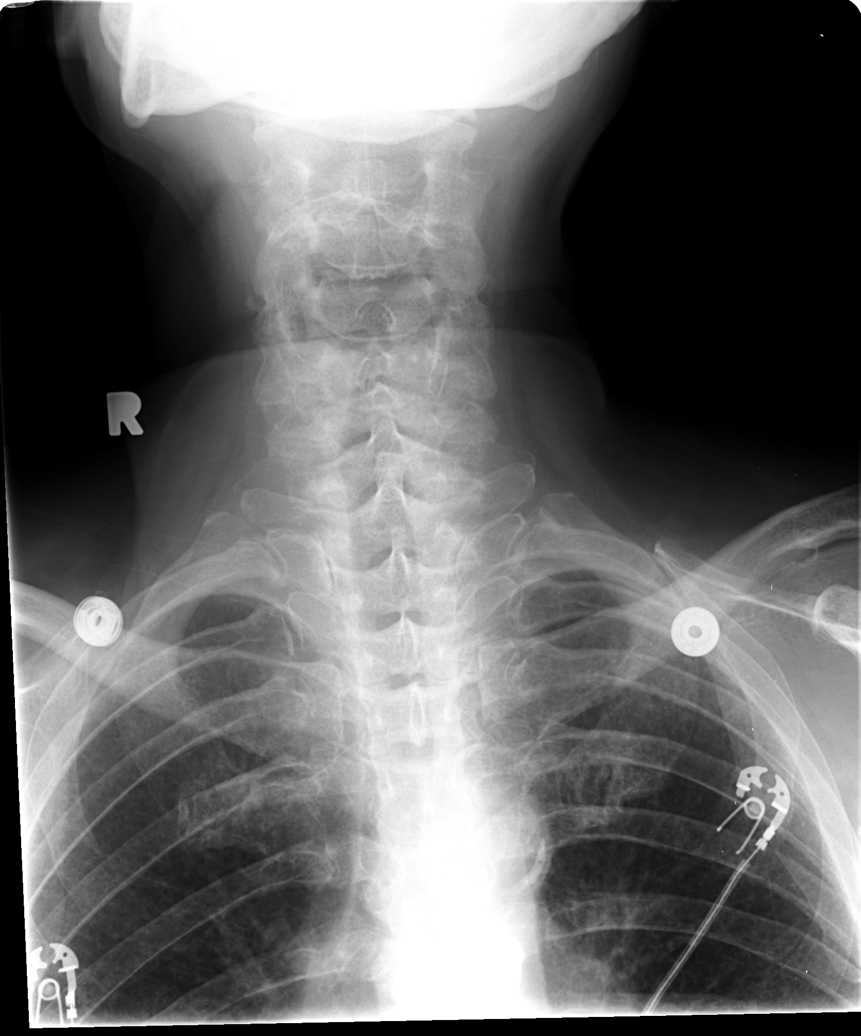

[view not recorded (4 of 6)]
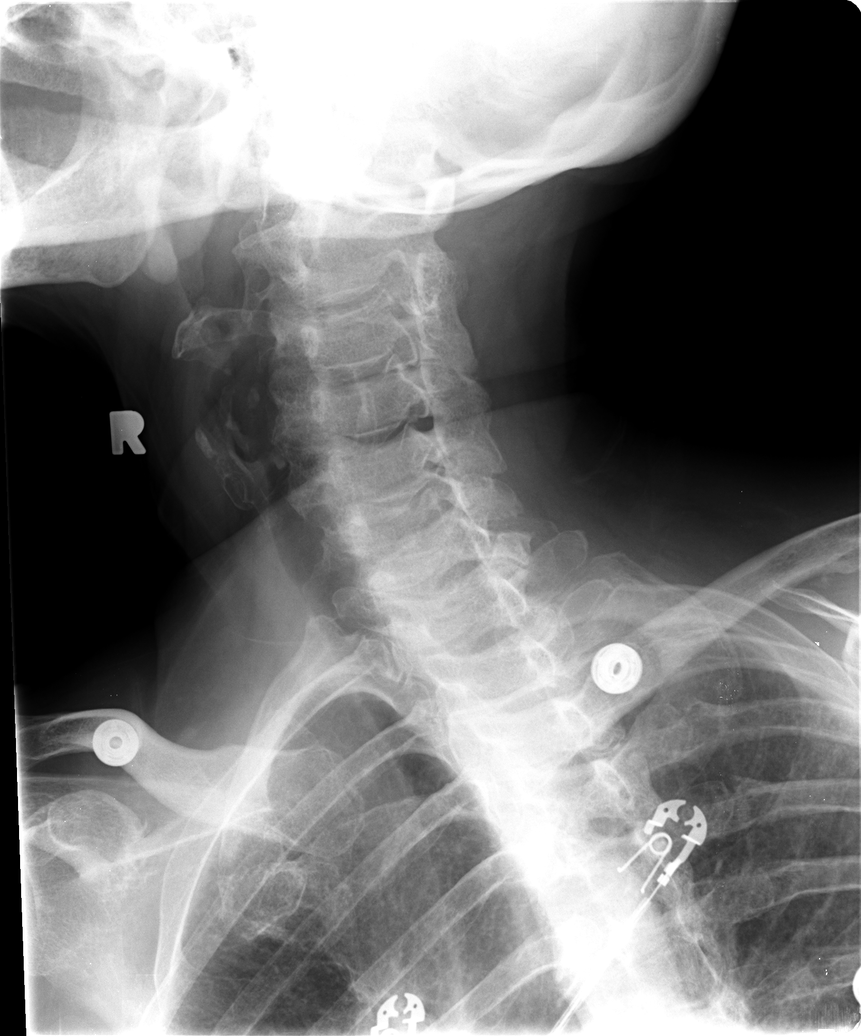

[view not recorded (5 of 6)]
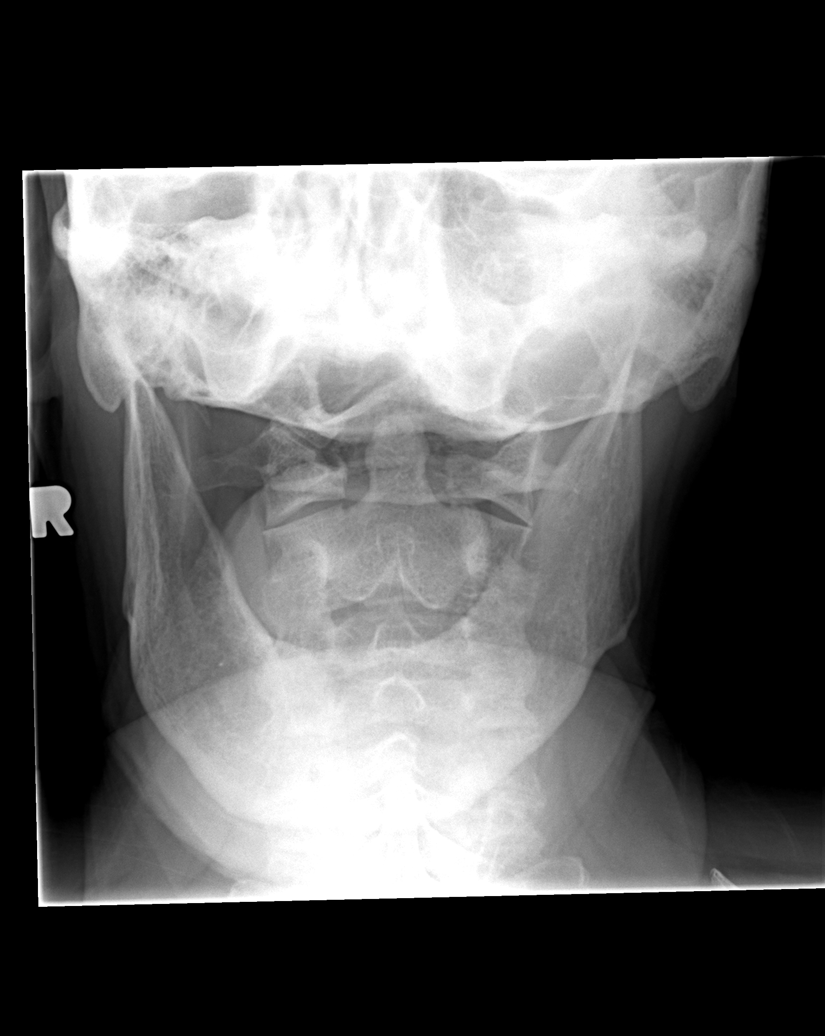

[view not recorded (6 of 6)]
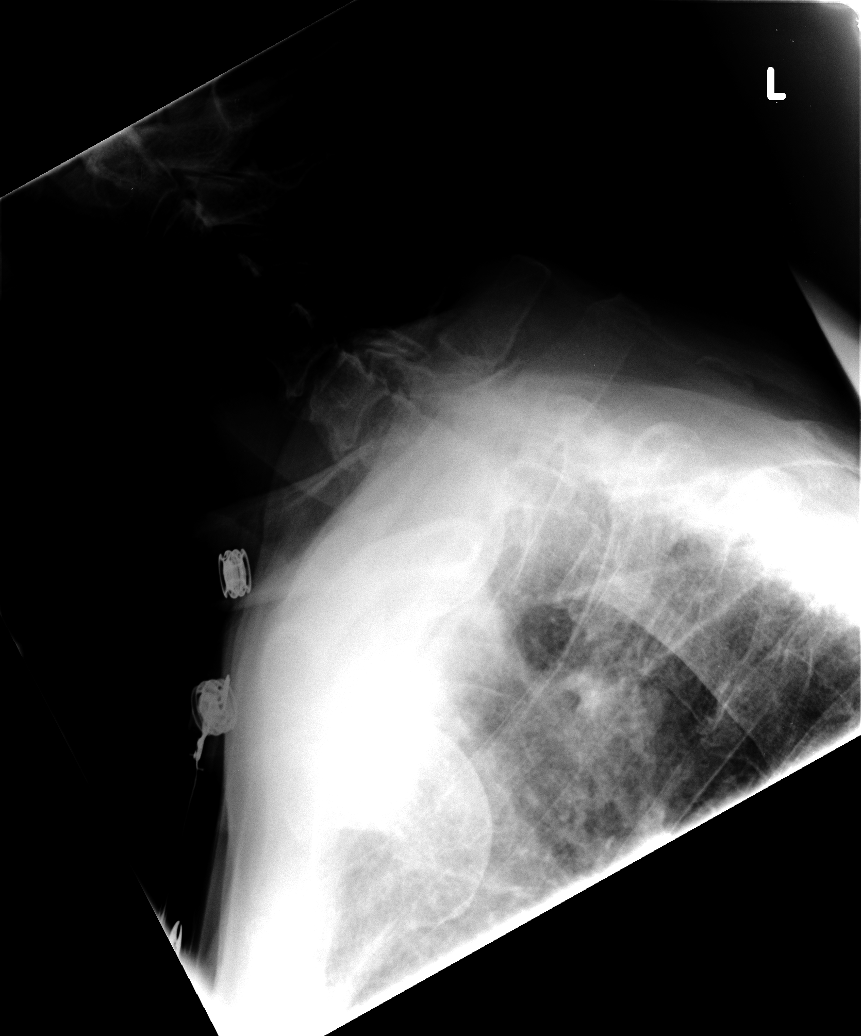

[6 of 6 positions shown; findings below may reference images not displayed]

FINDINGS: There is no evidence of listhesis, although C7-T1 is poorly
visualized on lateral images due to overlapping soft tissues.
Moderate lower cervical spondylosis is again seen. No acute fracture
is identified. Prevertebral soft tissues are unremarkable.
Visualized lung apices are clear.
IMPRESSION: No acute cervical spine fracture or listhesis identified. Suboptimal
evaluation of C7-T1.

## 2014-08-27 ENCOUNTER — Ambulatory Visit (INDEPENDENT_AMBULATORY_CARE_PROVIDER_SITE_OTHER): Payer: Medicare HMO | Admitting: Psychiatry

## 2014-08-27 DIAGNOSIS — F32A Depression, unspecified: Secondary | ICD-10-CM

## 2014-08-27 DIAGNOSIS — F329 Major depressive disorder, single episode, unspecified: Secondary | ICD-10-CM

## 2014-08-27 NOTE — Patient Instructions (Signed)
Discussed orally 

## 2014-08-27 NOTE — Progress Notes (Signed)
     THERAPIST PROGRESS NOTE  Session Time: Friday 08/27/2014 11:10 AM - 11:55 AM  Participation Level: Active  Behavioral Response: CasualAlert/Anxious/Depressed  Type of Therapy: Individual Therapy  Treatment Goals addressed: Improve ability to manage stress  Interventions: Supportive  Summary: [name] is a 63 y.o. female who presents with a 20 year history of depression with at least one psychiatric admission. She discontinued treatment in August 2015 but is resuming services today due to worsening symptoms of depression that appear to be triggered by increased marital stress. Per patient's report, husband has increased alcohol use and is drinking daily. He stays gone most of the time but is verbally abusive when at home. He does not contribute any money to any bills besides the mortgage as he spends money on alcohol. She reports paying the other bills and having little to no money to purchase food. Patient also reports he is cheating on her with multiple women. Patient is experiencing anxiety, depressed mood, crying spells, sleep difficulty ( wakes ups every hour), poor concentration, memory difficulty, and decreased of interest in activities.  She reports feelings of hopelessness and helplessness but denies any suicidal ideation. Patient reports son and daughter are supportive but being unable to stay overnight with either of them due to their spouses. She reports emotional support from a neighbor and her sister-in-law. Patient goes to [employer] 4 days per week and attends church weekly.  Suicidal/Homicidal: No  Therapist Response: Therapist works with patient to process feelings, identify coping techniques, explore resources, identify ways to use support system, discuss scheduling earlier appointment with psychiatrist Dr. Harrington Challenger  Plan: Patient agrees to return for an appointment in 2-3 weeks. She agrees to place name on waiting list for an earlier appointment with psychiatrist  Dr. Harrington Challenger.  Diagnosis: Axis I: Depressive Disorder NOS    Axis II: No diagnosis    BYNUM,PEGGY, LCSW 08/27/2014

## 2014-09-04 ENCOUNTER — Emergency Department (HOSPITAL_COMMUNITY): Payer: Medicare HMO

## 2014-09-04 ENCOUNTER — Encounter (HOSPITAL_COMMUNITY): Payer: Self-pay | Admitting: *Deleted

## 2014-09-04 ENCOUNTER — Inpatient Hospital Stay (HOSPITAL_COMMUNITY)
Admission: EM | Admit: 2014-09-04 | Discharge: 2014-09-06 | DRG: 149 | Disposition: A | Discharge status: Home / Self Care | Payer: Medicare HMO | Attending: Family Medicine | Admitting: Family Medicine

## 2014-09-04 DIAGNOSIS — R1310 Dysphagia, unspecified: Secondary | ICD-10-CM

## 2014-09-04 DIAGNOSIS — J9611 Chronic respiratory failure with hypoxia: Secondary | ICD-10-CM | POA: Diagnosis present

## 2014-09-04 DIAGNOSIS — J410 Simple chronic bronchitis: Secondary | ICD-10-CM

## 2014-09-04 DIAGNOSIS — E039 Hypothyroidism, unspecified: Secondary | ICD-10-CM | POA: Diagnosis present

## 2014-09-04 DIAGNOSIS — G629 Polyneuropathy, unspecified: Secondary | ICD-10-CM | POA: Diagnosis present

## 2014-09-04 DIAGNOSIS — R42 Dizziness and giddiness: Principal | ICD-10-CM

## 2014-09-04 DIAGNOSIS — T426X5A Adverse effect of other antiepileptic and sedative-hypnotic drugs, initial encounter: Secondary | ICD-10-CM | POA: Diagnosis present

## 2014-09-04 DIAGNOSIS — Z66 Do not resuscitate: Secondary | ICD-10-CM | POA: Diagnosis present

## 2014-09-04 DIAGNOSIS — Z794 Long term (current) use of insulin: Secondary | ICD-10-CM

## 2014-09-04 DIAGNOSIS — I252 Old myocardial infarction: Secondary | ICD-10-CM

## 2014-09-04 DIAGNOSIS — T443X5A Adverse effect of other parasympatholytics [anticholinergics and antimuscarinics] and spasmolytics, initial encounter: Secondary | ICD-10-CM | POA: Diagnosis present

## 2014-09-04 DIAGNOSIS — E109 Type 1 diabetes mellitus without complications: Secondary | ICD-10-CM | POA: Diagnosis present

## 2014-09-04 DIAGNOSIS — E0822 Diabetes mellitus due to underlying condition with diabetic chronic kidney disease: Secondary | ICD-10-CM

## 2014-09-04 DIAGNOSIS — T43215A Adverse effect of selective serotonin and norepinephrine reuptake inhibitors, initial encounter: Secondary | ICD-10-CM | POA: Diagnosis present

## 2014-09-04 DIAGNOSIS — F329 Major depressive disorder, single episode, unspecified: Secondary | ICD-10-CM | POA: Diagnosis present

## 2014-09-04 DIAGNOSIS — T404X5A Adverse effect of other synthetic narcotics, initial encounter: Secondary | ICD-10-CM | POA: Diagnosis present

## 2014-09-04 DIAGNOSIS — Z800 Family history of malignant neoplasm of digestive organs: Secondary | ICD-10-CM

## 2014-09-04 DIAGNOSIS — Z818 Family history of other mental and behavioral disorders: Secondary | ICD-10-CM

## 2014-09-04 DIAGNOSIS — T43595A Adverse effect of other antipsychotics and neuroleptics, initial encounter: Secondary | ICD-10-CM | POA: Diagnosis present

## 2014-09-04 DIAGNOSIS — E034 Atrophy of thyroid (acquired): Secondary | ICD-10-CM

## 2014-09-04 DIAGNOSIS — T424X5A Adverse effect of benzodiazepines, initial encounter: Secondary | ICD-10-CM | POA: Diagnosis present

## 2014-09-04 DIAGNOSIS — F5105 Insomnia due to other mental disorder: Secondary | ICD-10-CM

## 2014-09-04 DIAGNOSIS — R531 Weakness: Secondary | ICD-10-CM

## 2014-09-04 DIAGNOSIS — Z90710 Acquired absence of both cervix and uterus: Secondary | ICD-10-CM

## 2014-09-04 DIAGNOSIS — M5440 Lumbago with sciatica, unspecified side: Secondary | ICD-10-CM

## 2014-09-04 DIAGNOSIS — Z8673 Personal history of transient ischemic attack (TIA), and cerebral infarction without residual deficits: Secondary | ICD-10-CM

## 2014-09-04 DIAGNOSIS — F17210 Nicotine dependence, cigarettes, uncomplicated: Secondary | ICD-10-CM | POA: Diagnosis present

## 2014-09-04 DIAGNOSIS — K219 Gastro-esophageal reflux disease without esophagitis: Secondary | ICD-10-CM | POA: Diagnosis present

## 2014-09-04 DIAGNOSIS — Z7982 Long term (current) use of aspirin: Secondary | ICD-10-CM

## 2014-09-04 DIAGNOSIS — F32A Depression, unspecified: Secondary | ICD-10-CM

## 2014-09-04 DIAGNOSIS — J449 Chronic obstructive pulmonary disease, unspecified: Secondary | ICD-10-CM | POA: Diagnosis present

## 2014-09-04 DIAGNOSIS — I2510 Atherosclerotic heart disease of native coronary artery without angina pectoris: Secondary | ICD-10-CM | POA: Diagnosis present

## 2014-09-04 DIAGNOSIS — K2100 Gastro-esophageal reflux disease with esophagitis, without bleeding: Secondary | ICD-10-CM

## 2014-09-04 DIAGNOSIS — E871 Hypo-osmolality and hyponatremia: Secondary | ICD-10-CM | POA: Diagnosis present

## 2014-09-04 DIAGNOSIS — E785 Hyperlipidemia, unspecified: Secondary | ICD-10-CM | POA: Diagnosis present

## 2014-09-04 DIAGNOSIS — E119 Type 2 diabetes mellitus without complications: Secondary | ICD-10-CM

## 2014-09-04 DIAGNOSIS — J441 Chronic obstructive pulmonary disease with (acute) exacerbation: Secondary | ICD-10-CM | POA: Diagnosis present

## 2014-09-04 DIAGNOSIS — E876 Hypokalemia: Secondary | ICD-10-CM | POA: Diagnosis present

## 2014-09-04 DIAGNOSIS — R1319 Other dysphagia: Secondary | ICD-10-CM

## 2014-09-04 LAB — BLOOD GAS, ARTERIAL
ACID-BASE EXCESS: 2 mmol/L (ref 0.0–2.0)
BICARBONATE: 26.1 meq/L — AB (ref 20.0–24.0)
Drawn by: 22223
FIO2: 21 %
O2 Saturation: 89.8 %
PCO2 ART: 41.8 mmHg (ref 35.0–45.0)
PO2 ART: 54.3 mmHg — AB (ref 80.0–100.0)
Patient temperature: 37
TCO2: 23.7 mmol/L (ref 0–100)
pH, Arterial: 7.413 (ref 7.350–7.450)

## 2014-09-04 LAB — URINALYSIS, ROUTINE W REFLEX MICROSCOPIC
Bilirubin Urine: NEGATIVE
Glucose, UA: >1000 mg/dL — AB
KETONES UR: NEGATIVE mg/dL
LEUKOCYTES UA: NEGATIVE
NITRITE: NEGATIVE
PROTEIN: 100 mg/dL — AB
Specific Gravity, Urine: 1.015 (ref 1.005–1.030)
UROBILINOGEN UA: 0.2 mg/dL (ref 0.0–1.0)
pH: 5.5 (ref 5.0–8.0)

## 2014-09-04 LAB — CBC WITH DIFFERENTIAL/PLATELET
BASOS ABS: 0 10*3/uL (ref 0.0–0.1)
BASOS PCT: 0 % (ref 0–1)
Eosinophils Absolute: 0.3 10*3/uL (ref 0.0–0.7)
Eosinophils Relative: 4 % (ref 0–5)
HCT: 35.1 % — ABNORMAL LOW (ref 36.0–46.0)
HEMOGLOBIN: 11.6 g/dL — AB (ref 12.0–15.0)
LYMPHS PCT: 34 % (ref 12–46)
Lymphs Abs: 3 10*3/uL (ref 0.7–4.0)
MCH: 28.9 pg (ref 26.0–34.0)
MCHC: 33 g/dL (ref 30.0–36.0)
MCV: 87.5 fL (ref 78.0–100.0)
Monocytes Absolute: 0.5 10*3/uL (ref 0.1–1.0)
Monocytes Relative: 6 % (ref 3–12)
NEUTROS PCT: 56 % (ref 43–77)
Neutro Abs: 5.1 10*3/uL (ref 1.7–7.7)
Platelets: 220 10*3/uL (ref 150–400)
RBC: 4.01 MIL/uL (ref 3.87–5.11)
RDW: 14.3 % (ref 11.5–15.5)
WBC: 9.1 10*3/uL (ref 4.0–10.5)

## 2014-09-04 LAB — BASIC METABOLIC PANEL
ANION GAP: 13 (ref 5–15)
BUN: 11 mg/dL (ref 6–23)
CHLORIDE: 93 meq/L — AB (ref 96–112)
CO2: 27 mEq/L (ref 19–32)
Calcium: 9.3 mg/dL (ref 8.4–10.5)
Creatinine, Ser: 1.03 mg/dL (ref 0.50–1.10)
GFR calc non Af Amer: 57 mL/min — ABNORMAL LOW (ref >90)
GFR, EST AFRICAN AMERICAN: 66 mL/min — AB (ref >90)
Glucose, Bld: 338 mg/dL — ABNORMAL HIGH (ref 70–99)
POTASSIUM: 3.6 meq/L — AB (ref 3.7–5.3)
Sodium: 133 mEq/L — ABNORMAL LOW (ref 137–147)

## 2014-09-04 LAB — TROPONIN I: Troponin I: <0.3 ng/mL (ref <0.30)

## 2014-09-04 LAB — CBG MONITORING, ED: GLUCOSE-CAPILLARY: 357 mg/dL — AB (ref 70–99)

## 2014-09-04 LAB — URINE MICROSCOPIC-ADD ON

## 2014-09-04 MED ORDER — IPRATROPIUM-ALBUTEROL 0.5-2.5 (3) MG/3ML IN SOLN: 3.0000 mL | Freq: Four times a day (QID) | RESPIRATORY_TRACT | Status: DC

## 2014-09-04 MED ORDER — CLONAZEPAM 0.5 MG PO TABS
1.0000 mg | ORAL_TABLET | Freq: Every day | ORAL | Status: DC
  Administered 2014-09-05 – 2014-09-06 (×2): 1 mg via ORAL
  Filled 2014-09-04 (×2): qty 2

## 2014-09-04 MED ORDER — ALBUTEROL SULFATE (2.5 MG/3ML) 0.083% IN NEBU
2.5000 mg | INHALATION_SOLUTION | Freq: Four times a day (QID) | RESPIRATORY_TRACT | Status: DC
  Administered 2014-09-05 (×4): 2.5 mg via RESPIRATORY_TRACT
  Filled 2014-09-04 (×5): qty 3

## 2014-09-04 MED ORDER — METOPROLOL TARTRATE 25 MG PO TABS
12.5000 mg | ORAL_TABLET | Freq: Two times a day (BID) | ORAL | Status: DC
  Administered 2014-09-04 – 2014-09-06 (×4): 12.5 mg via ORAL
  Filled 2014-09-04 (×4): qty 1

## 2014-09-04 MED ORDER — ENOXAPARIN SODIUM 40 MG/0.4ML ~~LOC~~ SOLN
40.0000 mg | SUBCUTANEOUS | Status: DC
  Administered 2014-09-05 – 2014-09-06 (×2): 40 mg via SUBCUTANEOUS
  Filled 2014-09-04 (×2): qty 0.4

## 2014-09-04 MED ORDER — BECLOMETHASONE DIPROPIONATE 40 MCG/ACT IN AERS: 2.0000 | INHALATION_SPRAY | Freq: Two times a day (BID) | RESPIRATORY_TRACT | Status: DC

## 2014-09-04 MED ORDER — MECLIZINE HCL 12.5 MG PO TABS
ORAL_TABLET | ORAL | Status: AC
  Filled 2014-09-04: qty 2

## 2014-09-04 MED ORDER — LISINOPRIL 10 MG PO TABS
20.0000 mg | ORAL_TABLET | Freq: Every day | ORAL | Status: DC
  Administered 2014-09-05 – 2014-09-06 (×2): 20 mg via ORAL
  Filled 2014-09-04 (×2): qty 2

## 2014-09-04 MED ORDER — DULOXETINE HCL 60 MG PO CPEP
60.0000 mg | ORAL_CAPSULE | Freq: Every day | ORAL | Status: DC
  Administered 2014-09-05 – 2014-09-06 (×2): 60 mg via ORAL
  Filled 2014-09-04 (×2): qty 1

## 2014-09-04 MED ORDER — SODIUM CHLORIDE 0.9 % IV SOLN
INTRAVENOUS | Status: DC
  Administered 2014-09-04: 23:00:00 via INTRAVENOUS

## 2014-09-04 MED ORDER — LINACLOTIDE 145 MCG PO CAPS
145.0000 ug | ORAL_CAPSULE | Freq: Every day | ORAL | Status: DC
  Administered 2014-09-05 – 2014-09-06 (×2): 145 ug via ORAL
  Filled 2014-09-04 (×2): qty 1

## 2014-09-04 MED ORDER — PRAVASTATIN SODIUM 40 MG PO TABS
40.0000 mg | ORAL_TABLET | Freq: Every day | ORAL | Status: DC
  Administered 2014-09-05 – 2014-09-06 (×2): 40 mg via ORAL
  Filled 2014-09-04 (×2): qty 1

## 2014-09-04 MED ORDER — IPRATROPIUM BROMIDE 0.02 % IN SOLN: 0.5000 mg | Freq: Four times a day (QID) | RESPIRATORY_TRACT | Status: DC

## 2014-09-04 MED ORDER — ONDANSETRON HCL 4 MG PO TABS: 4.0000 mg | ORAL_TABLET | Freq: Four times a day (QID) | ORAL | Status: DC | PRN

## 2014-09-04 MED ORDER — PANTOPRAZOLE SODIUM 40 MG PO TBEC
40.0000 mg | DELAYED_RELEASE_TABLET | Freq: Every day | ORAL | Status: DC
  Administered 2014-09-05 – 2014-09-06 (×2): 40 mg via ORAL
  Filled 2014-09-04 (×2): qty 1

## 2014-09-04 MED ORDER — ALBUTEROL SULFATE (2.5 MG/3ML) 0.083% IN NEBU
2.5000 mg | INHALATION_SOLUTION | Freq: Four times a day (QID) | RESPIRATORY_TRACT | Status: DC
  Administered 2014-09-04: 2.5 mg via RESPIRATORY_TRACT
  Filled 2014-09-04: qty 3

## 2014-09-04 MED ORDER — HYDROCODONE-ACETAMINOPHEN 5-325 MG PO TABS: 1.0000 | ORAL_TABLET | ORAL | Status: DC | PRN

## 2014-09-04 MED ORDER — INSULIN GLARGINE 100 UNIT/ML ~~LOC~~ SOLN
35.0000 [IU] | Freq: Every day | SUBCUTANEOUS | Status: DC
  Administered 2014-09-04 – 2014-09-05 (×2): 35 [IU] via SUBCUTANEOUS
  Filled 2014-09-04 (×3): qty 0.35

## 2014-09-04 MED ORDER — ARIPIPRAZOLE 10 MG PO TABS
5.0000 mg | ORAL_TABLET | Freq: Every day | ORAL | Status: DC
  Administered 2014-09-05 – 2014-09-06 (×2): 5 mg via ORAL
  Filled 2014-09-04 (×2): qty 1

## 2014-09-04 MED ORDER — INSULIN ASPART 100 UNIT/ML ~~LOC~~ SOLN
0.0000 [IU] | Freq: Three times a day (TID) | SUBCUTANEOUS | Status: DC
  Administered 2014-09-05: 5 [IU] via SUBCUTANEOUS
  Administered 2014-09-05: 3 [IU] via SUBCUTANEOUS
  Administered 2014-09-05: 5 [IU] via SUBCUTANEOUS
  Administered 2014-09-06: 2 [IU] via SUBCUTANEOUS
  Administered 2014-09-06: 3 [IU] via SUBCUTANEOUS

## 2014-09-04 MED ORDER — ASPIRIN 81 MG PO CHEW
81.0000 mg | CHEWABLE_TABLET | Freq: Every day | ORAL | Status: DC
  Administered 2014-09-05 – 2014-09-06 (×2): 81 mg via ORAL
  Filled 2014-09-04 (×2): qty 1

## 2014-09-04 MED ORDER — SODIUM CHLORIDE 0.9 % IV BOLUS (SEPSIS)
500.0000 mL | Freq: Once | INTRAVENOUS | Status: AC
  Administered 2014-09-04: 500 mL via INTRAVENOUS

## 2014-09-04 MED ORDER — LEVOTHYROXINE SODIUM 100 MCG PO TABS
200.0000 ug | ORAL_TABLET | Freq: Every day | ORAL | Status: DC
  Administered 2014-09-05 – 2014-09-06 (×2): 200 ug via ORAL
  Filled 2014-09-04 (×2): qty 2

## 2014-09-04 MED ORDER — SODIUM CHLORIDE 0.9 % IJ SOLN
3.0000 mL | Freq: Two times a day (BID) | INTRAMUSCULAR | Status: DC
  Administered 2014-09-04: 3 mL via INTRAVENOUS

## 2014-09-04 MED ORDER — MECLIZINE HCL 12.5 MG PO TABS
25.0000 mg | ORAL_TABLET | Freq: Once | ORAL | Status: AC
  Administered 2014-09-04: 25 mg via ORAL
  Filled 2014-09-04: qty 2

## 2014-09-04 MED ORDER — ONDANSETRON HCL 4 MG/2ML IJ SOLN: 4.0000 mg | Freq: Four times a day (QID) | INTRAMUSCULAR | Status: DC | PRN

## 2014-09-04 NOTE — ED Provider Notes (Signed)
CSN: [id number]     Arrival date & time 09/04/14  1719 History   First MD Initiated Contact with Patient 09/04/14 1743     Chief Complaint  Patient presents with  . Fall     (Consider location/radiation/quality/duration/timing/severity/associated sxs/prior Treatment) HPI Comments: Patient is a 63 year old female with history of diabetes, COPD, DVT. She presents today with complaints of weakness that has progressed over the past 7 days. She states over the past 2 days she has fallen 3 times because her "legs gave out on her". She denies any injury in the falls and denies any loss of consciousness. She denies having struck her head, any headache, neck pain, or other specific complaints.  Patient is a 63 y.o. female presenting with fall. The history is provided by the patient.  Fall This is a new problem. Episode onset: 1 week ago. The problem occurs constantly. The problem has been gradually worsening. Pertinent negatives include no chest pain, no headaches and no shortness of breath. Nothing aggravates the symptoms. Nothing relieves the symptoms. She has tried nothing for the symptoms. The treatment provided no relief.    Past Medical History  Diagnosis Date  . Diabetes mellitus   . COPD (chronic obstructive pulmonary disease)   . ASCVD (arteriosclerotic cardiovascular disease)     MI in 96 requiring BMS CX; DES to M1 in 2000;normal coronary angiography in 2004  . Hyperlipidemia   . Hypothyroidism   . Tobacco abuse   . Depression   . DVT (deep venous thrombosis)   . Nephrolithiasis 2006    stone extraction   . Amputation of hand, right     traumatic  . Allergic rhinitis   . Cholelithiasis   . GERD (gastroesophageal reflux disease)   . Low back pain   . Peripheral neuropathy   . Diabetes mellitus type I   . DDD (degenerative disc disease), lumbar   . Sciatic pain     right  . Tremor    Past Surgical History  Procedure Laterality Date  . Dilation and curettage of uterus   1974  . Partial hysterectomy  1978  . Total abdominal hysterectomy w/ bilateral salpingoophorectomy  2002  . Shoulder surgery      Left shoulder for RTC;left arm surgery '98/left hand surgery 2001  . Cholecystectomy    . Umbilical hernia repair  2008  . Colonoscopy  01/2009    IRW:ERXVQM rectum/repeat in 5 yrs  . Back surgery    . Hand amputation Right     traumatic  . Esophagogastroduodenoscopy  05/2010    Dr. Tessie Fass, erosion. 19F dilation  . Colonoscopy N/A 04/29/2014    Dr.Rourk- attempted/incomplete colonoscopy. inadequate prep  . Esophagogastroduodenoscopy N/A 04/29/2014    Dr.Rourk- normal esophagus s/p passage of maloney dilator. small hiatal hernia- bx= chronic inflammation.  Venia Minks dilation N/A 04/29/2014    Procedure: Venia Minks DILATION;  Surgeon: Daneil Dolin, MD;  Location: [facility];  Service: Endoscopy;  Laterality: N/A;  . Colonoscopy N/A 05/27/2014    [id number] coli. Colonic polyps-removed as described above.Status post segmental biopsy   Family History  Problem Relation Age of Onset  . Depression Mother   . Bipolar disorder Mother   . Dementia Mother   . Alcohol abuse Father   . Aneurysm Father     deceased age 74, brain  . Pulmonary fibrosis Mother   . Colon cancer Paternal Grandfather     age greater than 2   History  Substance Use Topics  .  Smoking status: Current Every Day Smoker -- 0.50 packs/day for 40 years    Types: Cigarettes  . Smokeless tobacco: Not on file     Comment: 1/2 pack per day  . Alcohol Use: No   OB History    No data available     Review of Systems  Constitutional: Positive for fatigue.  Respiratory: Negative for shortness of breath.   Cardiovascular: Negative for chest pain.  Neurological: Negative for headaches.  All other systems reviewed and are negative.     Allergies  Ciprofloxacin; Iohexol; Latex; Neomycin-bacitracin zn-polymyx; Penicillins; Povidone-iodine; and Tape  Home Medications   Prior to  Admission medications   Medication Sig Start Date End Date Taking? Authorizing Provider  ARIPiprazole (ABILIFY) 5 MG tablet Take 1 tablet (5 mg total) by mouth daily. 12/03/13   Levonne Spiller, MD  aspirin 81 MG chewable tablet Chew 81 mg by mouth daily.    Historical Provider, MD  Cholecalciferol (VITAMIN D-3) 1000 UNITS CAPS Take 1 capsule by mouth daily.    Historical Provider, MD  colesevelam (WELCHOL) 625 MG tablet Take 1 tablet (625 mg total) by mouth 3 (three) times daily. 05/11/14   Mahala Menghini, PA-C  DULoxetine (CYMBALTA) 60 MG capsule Take 1 capsule (60 mg total) by mouth daily. 12/03/13 12/03/14  Levonne Spiller, MD  fish oil-omega-3 fatty acids 1000 MG capsule Take 2 g by mouth daily.      Historical Provider, MD  LANTUS SOLOSTAR 100 UNIT/ML Solostar Pen 20 Units at bedtime. 03/01/14   Historical Provider, MD  levothyroxine (SYNTHROID, LEVOTHROID) 200 MCG tablet Take 200 mcg by mouth daily before breakfast.    Historical Provider, MD  lisinopril (PRINIVIL,ZESTRIL) 20 MG tablet Take 20 mg by mouth daily.      Historical Provider, MD  Melatonin 3 MG TABS Take 1 tablet by mouth at bedtime as needed (insomnia).    Historical Provider, MD  metFORMIN (GLUCOPHAGE) 500 MG tablet Take 500 mg by mouth 2 (two) times daily.    Historical Provider, MD  metoprolol tartrate (LOPRESSOR) 12.5 mg TABS tablet Take 0.5 tablets (12.5 mg total) by mouth 2 (two) times daily. 06/05/13   Jani Gravel, MD  pravastatin (PRAVACHOL) 40 MG tablet Take 40 mg by mouth daily.      Historical Provider, MD  traMADol (ULTRAM) 50 MG tablet Take 50 mg by mouth 3 (three) times daily.    Historical Provider, MD  traZODone (DESYREL) 100 MG tablet Take 1 tablet (100 mg total) by mouth at bedtime. 12/03/13   Levonne Spiller, MD   BP 130/70 mmHg  Pulse 83  Temp(Src) 98.4 F (36.9 C) (Oral)  Resp 18  Ht 5' 6"  (1.676 m)  Wt 156 lb (70.761 kg)  BMI 25.19 kg/m2  SpO2 95% Physical Exam  Constitutional: She is oriented to person, place, and  time. She appears well-developed and well-nourished. No distress.  HENT:  Head: Normocephalic and atraumatic.  Mouth/Throat: Oropharynx is clear and moist.  Eyes: EOM are normal. Pupils are equal, round, and reactive to light.  Neck: Normal range of motion. Neck supple.  Cardiovascular: Normal rate and regular rhythm.  Exam reveals no gallop and no friction rub.   No murmur heard. Pulmonary/Chest: Effort normal and breath sounds normal. No respiratory distress. She has no wheezes.  Abdominal: Soft. Bowel sounds are normal. She exhibits no distension. There is no tenderness.  Musculoskeletal: Normal range of motion.  Neurological: She is alert and oriented to person, place, and time. No cranial  nerve deficit. She exhibits normal muscle tone. Coordination normal.  Skin: Skin is warm and dry. She is not diaphoretic.  Nursing note and vitals reviewed.   ED Course  Procedures (including critical care time) Labs Review Labs Reviewed  CBC WITH DIFFERENTIAL - Abnormal; Notable for the following:    Hemoglobin 11.6 (*)    HCT 35.1 (*)    All other components within normal limits  CBG MONITORING, ED - Abnormal; Notable for the following:    Glucose-Capillary 357 (*)    All other components within normal limits  BASIC METABOLIC PANEL  TROPONIN I  URINALYSIS, ROUTINE W REFLEX MICROSCOPIC    Imaging Review No results found.   EKG Interpretation   Date/Time:  Saturday September 04 2014 17:36:36 EST Ventricular Rate:  76 PR Interval:  162 QRS Duration: 89 QT Interval:  394 QTC Calculation: 443 R Axis:   -13 Text Interpretation:  Sinus rhythm Borderline repolarization abnormality  Confirmed by Beau Fanny  MD, Damyen Knoll ([id number]) on 09/04/2014 6:04:54 PM      MDM   Final diagnoses:  None    Patient is a 63 year old female with history of COPD, diabetes. She presents with a one-week history of progressive weakness. She is having difficulty ambulating to the point where she is actually  falling down. Her workup reveals no significant change from baseline on her laboratory studies. Her ABG reveals hypoxia, however no CO2 retention. EKG and troponin are negative.  We attempted to ambulate her in the hallway, however she became weak and stated that she had to return to bed. She is having difficulty remaining in a sitting position due to her weakness. She appears to be in no condition for return home as I believe she is at risk for falls.  I've discussed the case with Dr. Darrick Meigs from the hospitalist service who feels as though the patient may be overmedicated. Her medication list has multiple sedating medications and he is concerned this may be the cause. She will be admitted for observation and further evaluation.    Veryl Speak, MD 09/04/14 2051

## 2014-09-04 NOTE — ED Notes (Signed)
Pt states she has been feeling dizzy all week and today makes her 3rd fall. Pt states she hit her head when she fell today. She states she did not lose consciousness and after the fall continued on with her day. Comes in now to be checked. NAD noted.

## 2014-09-04 NOTE — ED Notes (Signed)
Pt had several falls today before calling 911. Pt states she has generalized weakness for the past week and today makes her 3rd fall. Pt states she hit her head when she fell today and has a bump on the back of her head. She complains of dizziness this entire week.

## 2014-09-04 NOTE — H&P (Signed)
PCP:   Maricela Curet, MD   Chief Complaint:  Dizziness  HPI: 63 year old female who   has a past medical history of Diabetes mellitus; COPD (chronic obstructive pulmonary disease); ASCVD (arteriosclerotic cardiovascular disease); Hyperlipidemia; Hypothyroidism; Tobacco abuse; Depression; DVT (deep venous thrombosis); Nephrolithiasis (2006); Amputation of hand, right; Allergic rhinitis; Cholelithiasis; GERD (gastroesophageal reflux disease); Low back pain; Peripheral neuropathy; Diabetes mellitus type I; DDD (degenerative disc disease), lumbar; Sciatic pain; and Tremor. Today presents to the ED with chief complaint of dizziness, she fell twice today though she denies passing out. As per patient she has been having dizziness and falling episodes for last one year. She has a history of stroke in the past, though she denies slurred speech or weakness of extremities. She does complain of generalized weakness throughout the body. Patient takes aspirin daily. In the ED CT head was done which was negative for stroke She denies dysuria, no chest pain shortness of breath no blurred vision no passing out episode. Patient takes multiple psychotropic meds at home for depression.  Allergies:   Allergies  Allergen Reactions  . Iohexol      Desc: CHEST TIGHTNESS,BRETHING PROBLEMS NEEDS PRE MEDS   . Tape Other (See Comments)    Tears skin  . Ciprofloxacin Rash  . Latex Rash    GLOVES   . Neomycin-Bacitracin Zn-Polymyx Rash  . Penicillins Rash  . Povidone-Iodine Rash      Past Medical History  Diagnosis Date  . Diabetes mellitus   . COPD (chronic obstructive pulmonary disease)   . ASCVD (arteriosclerotic cardiovascular disease)     MI in 96 requiring BMS CX; DES to M1 in 2000;normal coronary angiography in 2004  . Hyperlipidemia   . Hypothyroidism   . Tobacco abuse   . Depression   . DVT (deep venous thrombosis)   . Nephrolithiasis 2006    stone extraction   . Amputation of hand,  right     traumatic  . Allergic rhinitis   . Cholelithiasis   . GERD (gastroesophageal reflux disease)   . Low back pain   . Peripheral neuropathy   . Diabetes mellitus type I   . DDD (degenerative disc disease), lumbar   . Sciatic pain     right  . Tremor     Past Surgical History  Procedure Laterality Date  . Dilation and curettage of uterus  1974  . Partial hysterectomy  1978  . Total abdominal hysterectomy w/ bilateral salpingoophorectomy  2002  . Shoulder surgery      Left shoulder for RTC;left arm surgery '98/left hand surgery 2001  . Cholecystectomy    . Umbilical hernia repair  2008  . Colonoscopy  01/2009    NAT:FTDDUK rectum/repeat in 5 yrs  . Back surgery    . Hand amputation Right     traumatic  . Esophagogastroduodenoscopy  05/2010    Dr. Tessie Fass, erosion. 18F dilation  . Colonoscopy N/A 04/29/2014    Dr.Rourk- attempted/incomplete colonoscopy. inadequate prep  . Esophagogastroduodenoscopy N/A 04/29/2014    Dr.Rourk- normal esophagus s/p passage of maloney dilator. small hiatal hernia- bx= chronic inflammation.  Venia Minks dilation N/A 04/29/2014    Procedure: Venia Minks DILATION;  Surgeon: Daneil Dolin, MD;  Location: [facility];  Service: Endoscopy;  Laterality: N/A;  . Colonoscopy N/A 05/27/2014    GUR:[id number] coli. Colonic polyps-removed as described above.Status post segmental biopsy    Prior to Admission medications   Medication Sig Start Date End Date Taking? Authorizing Provider  albuterol (  PROVENTIL) (2.5 MG/3ML) 0.083% nebulizer solution Take 2.5 mg by nebulization every 6 (six) hours as needed for wheezing or shortness of breath.   Yes Historical Provider, MD  ARIPiprazole (ABILIFY) 5 MG tablet Take 1 tablet (5 mg total) by mouth daily. 12/03/13  Yes Levonne Spiller, MD  aspirin 81 MG chewable tablet Chew 81 mg by mouth daily.   Yes Historical Provider, MD  benztropine (COGENTIN) 1 MG tablet Take 1 mg by mouth daily. 08/31/14  Yes Historical Provider, MD   Cholecalciferol (VITAMIN D-3) 1000 UNITS CAPS Take 1 capsule by mouth daily.   Yes Historical Provider, MD  clonazePAM (KLONOPIN) 2 MG tablet Take 2 mg by mouth daily.   Yes Historical Provider, MD  colesevelam (WELCHOL) 625 MG tablet Take 1 tablet (625 mg total) by mouth 3 (three) times daily. Patient taking differently: Take 625 mg by mouth daily.  05/11/14  Yes Mahala Menghini, PA-C  DULoxetine (CYMBALTA) 60 MG capsule Take 1 capsule (60 mg total) by mouth daily. 12/03/13 12/03/14 Yes Levonne Spiller, MD  fish oil-omega-3 fatty acids 1000 MG capsule Take 2 g by mouth daily.     Yes Historical Provider, MD  furosemide (LASIX) 20 MG tablet Take 20 mg by mouth daily. 09/01/14  Yes Historical Provider, MD  gabapentin (NEURONTIN) 300 MG capsule Take 300 mg by mouth daily. 08/31/14  Yes Historical Provider, MD  LANTUS SOLOSTAR 100 UNIT/ML Solostar Pen Inject 35 Units into the skin at bedtime.  03/01/14  Yes Historical Provider, MD  levothyroxine (SYNTHROID, LEVOTHROID) 200 MCG tablet Take 200 mcg by mouth daily before breakfast.   Yes Historical Provider, MD  Linaclotide (LINZESS) 145 MCG CAPS capsule Take 145 mcg by mouth daily.   Yes Historical Provider, MD  lisinopril (PRINIVIL,ZESTRIL) 20 MG tablet Take 20 mg by mouth daily.     Yes Historical Provider, MD  Melatonin 3 MG TABS Take 1 tablet by mouth at bedtime as needed (insomnia).   Yes Historical Provider, MD  metFORMIN (GLUCOPHAGE) 500 MG tablet Take 500 mg by mouth 2 (two) times daily.   Yes Historical Provider, MD  metoprolol tartrate (LOPRESSOR) 12.5 mg TABS tablet Take 0.5 tablets (12.5 mg total) by mouth 2 (two) times daily. 06/05/13  Yes Jani Gravel, MD  NEXIUM 40 MG capsule Take 40 mg by mouth daily. 08/31/14  Yes Historical Provider, MD  nitroGLYCERIN (NITROLINGUAL) 0.4 MG/SPRAY spray Place 1 spray under the tongue every 5 (five) minutes x 3 doses as needed for chest pain.   Yes Historical Provider, MD  OVER THE COUNTER MEDICATION Place 2 sprays  into the nose 2 (two) times daily. 4 Way Nasal Spray   Yes Historical Provider, MD  pravastatin (PRAVACHOL) 40 MG tablet Take 40 mg by mouth daily.     Yes Historical Provider, MD  QVAR 40 MCG/ACT inhaler Take 2 puffs by mouth 2 (two) times daily. 08/19/14  Yes Historical Provider, MD  traMADol (ULTRAM) 50 MG tablet Take 50 mg by mouth 3 (three) times daily.   Yes Historical Provider, MD  traZODone (DESYREL) 100 MG tablet Take 1 tablet (100 mg total) by mouth at bedtime. 12/03/13  Yes Levonne Spiller, MD    Social History:  reports that she has been smoking Cigarettes.  She has a 20 pack-year smoking history. She does not have any smokeless tobacco history on file. She reports that she does not drink alcohol or use illicit drugs.  Family History  Problem Relation Age of Onset  . Depression Mother   .  Bipolar disorder Mother   . Dementia Mother   . Alcohol abuse Father   . Aneurysm Father     deceased age 67, brain  . Pulmonary fibrosis Mother   . Colon cancer Paternal Grandfather     age greater than 79     All the positives are listed in BOLD  Review of Systems:  HEENT: Headache, blurred vision, runny nose, sore throat Neck: Hypothyroidism, hyperthyroidism,,lymphadenopathy Chest : Shortness of breath, history of COPD, Asthma Heart : Chest pain, history of coronary arterey disease GI:  Nausea, vomiting, diarrhea, constipation, GERD GU: Dysuria, urgency, frequency of urination, hematuria Neuro: Stroke, seizures, syncope Psych: Depression, anxiety, hallucinations   Physical Exam: Blood pressure 115/56, pulse 71, temperature 98.4 F (36.9 C), temperature source Oral, resp. rate 19, height 5' 6"  (1.676 m), weight 70.761 kg (156 lb), SpO2 100 %. Constitutional:   Patient is a well-developed and well-nourished female* in no acute distress and cooperative with exam. Head: Normocephalic and atraumatic Mouth: Mucus membranes moist Eyes: PERRL, EOMI, conjunctivae normal Neck: Supple, No  Thyromegaly Cardiovascular: RRR, S1 normal, S2 normal Pulmonary/Chest: Bilateral wheezing Abdominal: Soft. Non-tender, non-distended, bowel sounds are normal, no masses, organomegaly, or guarding present.  Neurological: A&O x3, Strength is normal and symmetric bilaterally, cranial nerve II-XII are grossly intact, no focal motor deficit, sensory intact to light touch bilaterally.  Extremities : No Cyanosis, Clubbing or Edema  Labs on Admission:  Basic Metabolic Panel:  Recent Labs Lab 09/04/14 1740  NA 133*  K 3.6*  CL 93*  CO2 27  GLUCOSE 338*  BUN 11  CREATININE 1.03  CALCIUM 9.3   Liver Function Tests: No results for input(s): AST, ALT, ALKPHOS, BILITOT, PROT, ALBUMIN in the last 168 hours. No results for input(s): LIPASE, AMYLASE in the last 168 hours. No results for input(s): AMMONIA in the last 168 hours. CBC:  Recent Labs Lab 09/04/14 1740  WBC 9.1  NEUTROABS 5.1  HGB 11.6*  HCT 35.1*  MCV 87.5  PLT 220   Cardiac Enzymes:  Recent Labs Lab 09/04/14 1944  TROPONINI <0.30    BNP (last 3 results)  Recent Labs  09/27/13 1957  PROBNP 244.5*   CBG:  Recent Labs Lab 09/04/14 1737  GLUCAP 357*    Radiological Exams on Admission: Ct Head Wo Contrast  09/04/2014   CLINICAL DATA:  Altered mental status with dizziness. Fall striking back of head. History of diabetes and COPD. Initial encounter.  EXAM: CT HEAD WITHOUT CONTRAST  TECHNIQUE: Contiguous axial images were obtained from the base of the skull through the vertex without intravenous contrast.  COMPARISON:  Head CT 09/27/2013.  FINDINGS: There is no evidence of acute intracranial hemorrhage, mass lesion, brain edema or extra-axial fluid collection. The ventricles and subarachnoid spaces are appropriately sized for age. There is no CT evidence of acute cortical infarction.  The visualized paranasal sinuses, mastoid air cells and middle ears are clear. The calvarium is intact.  IMPRESSION: Stable  examination. No acute or significant intracranial or calvarial findings.   Electronically Signed   By: Camie Patience M.D.   On: 09/04/2014 19:17   Dg Chest Port 1 View  09/04/2014   CLINICAL DATA:  Generalized weakness and dizziness today. No chest pain. Initial encounter.  EXAM: PORTABLE CHEST - 1 VIEW  COMPARISON:  10/30/2013 and 09/27/2013.  FINDINGS: 2050 hr. The heart size and mediastinal contours are stable. There is stable mild accentuation of the bronchovascular markings which appears chronic. There is no pleural effusion  or confluent airspace opacity. The bones appear stable. Telemetry leads overlie the chest.  IMPRESSION: Stable examination with mild chronic interstitial prominence. No acute cardiopulmonary process.   Electronically Signed   By: Camie Patience M.D.   On: 09/04/2014 21:05    EKG: Independently reviewed. *Sinus rhythm   Assessment/Plan Active Problems:   Hypothyroidism   Diabetes mellitus   Depression   Cardiovascular disease   COPD (chronic obstructive pulmonary disease)   Weakness   Dizziness  Dizziness Likely secondary to polypharmacy, patient is on multiple psychotropic medications including Abilify, Cogentin, clonazepam, Neurontin, tramadol, trazodone. I will hold tramadol, trazodone, Neurontin, Cogentin at this time Will cut down the dose of clonazepam to 1 mg by mouth daily Will get PT OT consultation in a.m. We'll also obtain MRI brain in a.m. to rule out CVA  Diabetes mellitus Continue Lantus 35 units subcutaneous at bedtime. We'll also initiate sliding scale insulin with NovoLog.  COPD Patient has mild bilateral wheezing, will start DuoNeb nebulizers every 6 hours scheduled. Does not think that patient needs steroids at this time. However if she doesn't improve by tomorrow consider starting tapering dose of prednisone.  Depression Continue Abilify, Cymbalta.   DVT prophylaxis Lovenox   Code status: Patient is DO NOT RESUSCITATE    Time  Spent on Admission: 41 minutes  [facility] Pager: [phone] 09/04/2014, 9:25 PM  If 7PM-7AM, please contact night-coverage  [email or web address]  Password [email or web address]

## 2014-09-05 ENCOUNTER — Inpatient Hospital Stay (HOSPITAL_COMMUNITY): Payer: Medicare HMO

## 2014-09-05 LAB — CBC
HCT: 33 % — ABNORMAL LOW (ref 36.0–46.0)
Hemoglobin: 10.8 g/dL — ABNORMAL LOW (ref 12.0–15.0)
MCH: 28.8 pg (ref 26.0–34.0)
MCHC: 32.7 g/dL (ref 30.0–36.0)
MCV: 88 fL (ref 78.0–100.0)
PLATELETS: 208 10*3/uL (ref 150–400)
RBC: 3.75 MIL/uL — ABNORMAL LOW (ref 3.87–5.11)
RDW: 14.4 % (ref 11.5–15.5)
WBC: 6.7 10*3/uL (ref 4.0–10.5)

## 2014-09-05 LAB — GLUCOSE, CAPILLARY
GLUCOSE-CAPILLARY: 260 mg/dL — AB (ref 70–99)
Glucose-Capillary: 376 mg/dL — ABNORMAL HIGH (ref 70–99)
Glucose-Capillary: 237 mg/dL — ABNORMAL HIGH (ref 70–99)
Glucose-Capillary: 251 mg/dL — ABNORMAL HIGH (ref 70–99)
Glucose-Capillary: 273 mg/dL — ABNORMAL HIGH (ref 70–99)

## 2014-09-05 LAB — COMPREHENSIVE METABOLIC PANEL
ALT: 15 U/L (ref 0–35)
ANION GAP: 12 (ref 5–15)
AST: 16 U/L (ref 0–37)
Albumin: 2.5 g/dL — ABNORMAL LOW (ref 3.5–5.2)
Alkaline Phosphatase: 143 U/L — ABNORMAL HIGH (ref 39–117)
BUN: 9 mg/dL (ref 6–23)
CO2: 27 mEq/L (ref 19–32)
Calcium: 8.8 mg/dL (ref 8.4–10.5)
Chloride: 102 mEq/L (ref 96–112)
Creatinine, Ser: 0.9 mg/dL (ref 0.50–1.10)
GFR calc Af Amer: 78 mL/min — ABNORMAL LOW (ref >90)
GFR calc non Af Amer: 67 mL/min — ABNORMAL LOW (ref >90)
Glucose, Bld: 218 mg/dL — ABNORMAL HIGH (ref 70–99)
POTASSIUM: 3.6 meq/L — AB (ref 3.7–5.3)
SODIUM: 141 meq/L (ref 137–147)
TOTAL PROTEIN: 6.2 g/dL (ref 6.0–8.3)
Total Bilirubin: 0.2 mg/dL — ABNORMAL LOW (ref 0.3–1.2)

## 2014-09-05 MED ORDER — POTASSIUM CHLORIDE CRYS ER 20 MEQ PO TBCR
20.0000 meq | EXTENDED_RELEASE_TABLET | Freq: Three times a day (TID) | ORAL | Status: DC
  Administered 2014-09-05 – 2014-09-06 (×4): 20 meq via ORAL
  Filled 2014-09-05 (×4): qty 1

## 2014-09-05 MED ORDER — FLUTICASONE PROPIONATE HFA 44 MCG/ACT IN AERO
2.0000 | INHALATION_SPRAY | Freq: Two times a day (BID) | RESPIRATORY_TRACT | Status: DC
  Administered 2014-09-05 (×2): 2 via RESPIRATORY_TRACT
  Filled 2014-09-05: qty 10.6

## 2014-09-05 NOTE — Progress Notes (Signed)
Subjective: This patient is a 63 year old female who was admitted yesterday by the hospitalist because of dizziness and 2 falls. She feels that her dizziness is better today but has not resolved. A CT scan of her head revealed no sign of acute stroke. An MRI was ordered. This remains pending.  Objective: Vital signs in last 24 hours: Filed Vitals:   09/04/14 2333 09/05/14 0530 09/05/14 0726 09/05/14 0928  BP:  143/60  106/61  Pulse:  61  101  Temp:  98.7 F (37.1 C)    TempSrc:  Oral    Resp:      Height:      Weight:      SpO2: 93% 98% 97%    Weight change:   Intake/Output Summary (Last 24 hours) at 09/05/14 1006 Last data filed at 09/05/14 0551  Gross per 24 hour  Intake  532.5 ml  Output      0 ml  Net  532.5 ml    Physical Exam: Alert. No distress. Face symmetric. No slurred speech noted. Lungs reveal bilateral wheezes. Heart regular with no murmurs. Abdomen is soft and nontender. Extremities reveal no edema. Neuro reveals no focal weakness.  Lab Results:    Results for orders placed or performed during the hospital encounter of 09/04/14 (from the past 24 hour(s))  CBG monitoring, ED     Status: Abnormal   Collection Time: 09/04/14  5:37 PM  Result Value Ref Range   Glucose-Capillary 357 (H) 70 - 99 mg/dL  CBC with Differential     Status: Abnormal   Collection Time: 09/04/14  5:40 PM  Result Value Ref Range   WBC 9.1 4.0 - 10.5 K/uL   RBC 4.01 3.87 - 5.11 MIL/uL   Hemoglobin 11.6 (L) 12.0 - 15.0 g/dL   HCT 35.1 (L) 36.0 - 46.0 %   MCV 87.5 78.0 - 100.0 fL   MCH 28.9 26.0 - 34.0 pg   MCHC 33.0 30.0 - 36.0 g/dL   RDW 14.3 11.5 - 15.5 %   Platelets 220 150 - 400 K/uL   Neutrophils Relative % 56 43 - 77 %   Neutro Abs 5.1 1.7 - 7.7 K/uL   Lymphocytes Relative 34 12 - 46 %   Lymphs Abs 3.0 0.7 - 4.0 K/uL   Monocytes Relative 6 3 - 12 %   Monocytes Absolute 0.5 0.1 - 1.0 K/uL   Eosinophils Relative 4 0 - 5 %   Eosinophils Absolute 0.3 0.0 - 0.7 K/uL   Basophils Relative 0 0 - 1 %   Basophils Absolute 0.0 0.0 - 0.1 K/uL  Basic metabolic panel     Status: Abnormal   Collection Time: 09/04/14  5:40 PM  Result Value Ref Range   Sodium 133 (L) 137 - 147 mEq/L   Potassium 3.6 (L) 3.7 - 5.3 mEq/L   Chloride 93 (L) 96 - 112 mEq/L   CO2 27 19 - 32 mEq/L   Glucose, Bld 338 (H) 70 - 99 mg/dL   BUN 11 6 - 23 mg/dL   Creatinine, Ser 1.03 0.50 - 1.10 mg/dL   Calcium 9.3 8.4 - 10.5 mg/dL   GFR calc non Af Amer 57 (L) >90 mL/min   GFR calc Af Amer 66 (L) >90 mL/min   Anion gap 13 5 - 15  Urinalysis, Routine w reflex microscopic     Status: Abnormal   Collection Time: 09/04/14  6:41 PM  Result Value Ref Range   Color, Urine YELLOW YELLOW  APPearance CLEAR CLEAR   Specific Gravity, Urine 1.015 1.005 - 1.030   pH 5.5 5.0 - 8.0   Glucose, UA >1000 (A) NEGATIVE mg/dL   Hgb urine dipstick MODERATE (A) NEGATIVE   Bilirubin Urine NEGATIVE NEGATIVE   Ketones, ur NEGATIVE NEGATIVE mg/dL   Protein, ur 100 (A) NEGATIVE mg/dL   Urobilinogen, UA 0.2 0.0 - 1.0 mg/dL   Nitrite NEGATIVE NEGATIVE   Leukocytes, UA NEGATIVE NEGATIVE  Urine microscopic-add on     Status: Abnormal   Collection Time: 09/04/14  6:41 PM  Result Value Ref Range   WBC, UA 11-20 <3 WBC/hpf   Bacteria, UA FEW (A) RARE   Urine-Other FEW YEAST   Troponin I     Status: None   Collection Time: 09/04/14  7:44 PM  Result Value Ref Range   Troponin I <0.30 <0.30 ng/mL  Blood gas, arterial ([facility] ONLY)     Status: Abnormal   Collection Time: 09/04/14  8:18 PM  Result Value Ref Range   FIO2 21.00 %   Delivery systems ROOM AIR    pH, Arterial 7.413 7.350 - 7.450   pCO2 arterial 41.8 35.0 - 45.0 mmHg   pO2, Arterial 54.3 (L) 80.0 - 100.0 mmHg   Bicarbonate 26.1 (H) 20.0 - 24.0 mEq/L   TCO2 23.7 0 - 100 mmol/L   Acid-Base Excess 2.0 0.0 - 2.0 mmol/L   O2 Saturation 89.8 %   Patient temperature 37.0    Collection site RIGHT BRACHIAL    Drawn by [id number]    Sample type ARTERIAL     Allens test (pass/fail) PASS PASS  Glucose, capillary     Status: Abnormal   Collection Time: 09/04/14 11:25 PM  Result Value Ref Range   Glucose-Capillary 376 (H) 70 - 99 mg/dL   Comment 1 Documented in Chart    Comment 2 Notify RN   CBC     Status: Abnormal   Collection Time: 09/05/14  5:44 AM  Result Value Ref Range   WBC 6.7 4.0 - 10.5 K/uL   RBC 3.75 (L) 3.87 - 5.11 MIL/uL   Hemoglobin 10.8 (L) 12.0 - 15.0 g/dL   HCT 33.0 (L) 36.0 - 46.0 %   MCV 88.0 78.0 - 100.0 fL   MCH 28.8 26.0 - 34.0 pg   MCHC 32.7 30.0 - 36.0 g/dL   RDW 14.4 11.5 - 15.5 %   Platelets 208 150 - 400 K/uL  Comprehensive metabolic panel     Status: Abnormal   Collection Time: 09/05/14  5:44 AM  Result Value Ref Range   Sodium 141 137 - 147 mEq/L   Potassium 3.6 (L) 3.7 - 5.3 mEq/L   Chloride 102 96 - 112 mEq/L   CO2 27 19 - 32 mEq/L   Glucose, Bld 218 (H) 70 - 99 mg/dL   BUN 9 6 - 23 mg/dL   Creatinine, Ser 0.90 0.50 - 1.10 mg/dL   Calcium 8.8 8.4 - 10.5 mg/dL   Total Protein 6.2 6.0 - 8.3 g/dL   Albumin 2.5 (L) 3.5 - 5.2 g/dL   AST 16 0 - 37 U/L   ALT 15 0 - 35 U/L   Alkaline Phosphatase 143 (H) 39 - 117 U/L   Total Bilirubin 0.2 (L) 0.3 - 1.2 mg/dL   GFR calc non Af Amer 67 (L) >90 mL/min   GFR calc Af Amer 78 (L) >90 mL/min   Anion gap 12 5 - 15  Glucose, capillary  Status: Abnormal   Collection Time: 09/05/14  7:17 AM  Result Value Ref Range   Glucose-Capillary 237 (H) 70 - 99 mg/dL   Comment 1 Documented in Chart    Comment 2 Notify RN      ABGS  Recent Labs  09/04/14 2018  PHART 7.413  PO2ART 54.3*  TCO2 23.7  HCO3 26.1*   CULTURES No results found for this or any previous visit (from the past 240 hour(s)). Studies/Results: Ct Head Wo Contrast  09/04/2014   CLINICAL DATA:  Altered mental status with dizziness. Fall striking back of head. History of diabetes and COPD. Initial encounter.  EXAM: CT HEAD WITHOUT CONTRAST  TECHNIQUE: Contiguous axial images were obtained from  the base of the skull through the vertex without intravenous contrast.  COMPARISON:  Head CT 09/27/2013.  FINDINGS: There is no evidence of acute intracranial hemorrhage, mass lesion, brain edema or extra-axial fluid collection. The ventricles and subarachnoid spaces are appropriately sized for age. There is no CT evidence of acute cortical infarction.  The visualized paranasal sinuses, mastoid air cells and middle ears are clear. The calvarium is intact.  IMPRESSION: Stable examination. No acute or significant intracranial or calvarial findings.   Electronically Signed   By: Camie Patience M.D.   On: 09/04/2014 19:17   Dg Chest Port 1 View  09/04/2014   CLINICAL DATA:  Generalized weakness and dizziness today. No chest pain. Initial encounter.  EXAM: PORTABLE CHEST - 1 VIEW  COMPARISON:  10/30/2013 and 09/27/2013.  FINDINGS: 2050 hr. The heart size and mediastinal contours are stable. There is stable mild accentuation of the bronchovascular markings which appears chronic. There is no pleural effusion or confluent airspace opacity. The bones appear stable. Telemetry leads overlie the chest.  IMPRESSION: Stable examination with mild chronic interstitial prominence. No acute cardiopulmonary process.   Electronically Signed   By: Camie Patience M.D.   On: 09/04/2014 21:05   Micro Results: No results found for this or any previous visit (from the past 240 hour(s)). Studies/Results: Ct Head Wo Contrast  09/04/2014   CLINICAL DATA:  Altered mental status with dizziness. Fall striking back of head. History of diabetes and COPD. Initial encounter.  EXAM: CT HEAD WITHOUT CONTRAST  TECHNIQUE: Contiguous axial images were obtained from the base of the skull through the vertex without intravenous contrast.  COMPARISON:  Head CT 09/27/2013.  FINDINGS: There is no evidence of acute intracranial hemorrhage, mass lesion, brain edema or extra-axial fluid collection. The ventricles and subarachnoid spaces are appropriately  sized for age. There is no CT evidence of acute cortical infarction.  The visualized paranasal sinuses, mastoid air cells and middle ears are clear. The calvarium is intact.  IMPRESSION: Stable examination. No acute or significant intracranial or calvarial findings.   Electronically Signed   By: Camie Patience M.D.   On: 09/04/2014 19:17   Dg Chest Port 1 View  09/04/2014   CLINICAL DATA:  Generalized weakness and dizziness today. No chest pain. Initial encounter.  EXAM: PORTABLE CHEST - 1 VIEW  COMPARISON:  10/30/2013 and 09/27/2013.  FINDINGS: 2050 hr. The heart size and mediastinal contours are stable. There is stable mild accentuation of the bronchovascular markings which appears chronic. There is no pleural effusion or confluent airspace opacity. The bones appear stable. Telemetry leads overlie the chest.  IMPRESSION: Stable examination with mild chronic interstitial prominence. No acute cardiopulmonary process.   Electronically Signed   By: Camie Patience M.D.   On: 09/04/2014 21:05  Medications:  I have reviewed the patient's current medications Scheduled Meds: . albuterol  2.5 mg Nebulization QID  . ARIPiprazole  5 mg Oral Daily  . aspirin  81 mg Oral Daily  . clonazePAM  1 mg Oral Daily  . DULoxetine  60 mg Oral Daily  . enoxaparin (LOVENOX) injection  40 mg Subcutaneous Q24H  . fluticasone  2 puff Inhalation BID  . insulin aspart  0-9 Units Subcutaneous TID WC  . insulin glargine  35 Units Subcutaneous QHS  . levothyroxine  200 mcg Oral QAC breakfast  . Linaclotide  145 mcg Oral Daily  . lisinopril  20 mg Oral Daily  . metoprolol tartrate  12.5 mg Oral BID  . pantoprazole  40 mg Oral Daily  . potassium chloride  20 mEq Oral TID  . pravastatin  40 mg Oral Daily  . sodium chloride  3 mL Intravenous Q12H   Continuous Infusions: . sodium chloride 75 mL/hr at 09/05/14 0800   PRN Meds:.HYDROcodone-acetaminophen, ondansetron **OR** ondansetron (ZOFRAN) IV   Assessment/Plan: #1.  Dizziness. Falls. MRI of the brain pending.  Medications have been modified on admission. Will observe on previously ordered meds for now.  #2. Hyponatremia. Improved. Serum sodium is up to 141 from 133 with IV fluids. Will reduce IV fluids to [facility]. #3. Hypokalemia. 3.6. Replace orally. #4. Diabetes. Continue Lantus with sliding scale NovoLog. #5. COPD. Chronic respiratory failure. Continue oxygen and inhaled bronchodilators. #6. Possible UTI. Check urine culture. Active Problems:   Hypothyroidism   Diabetes mellitus   Depression   Cardiovascular disease   COPD (chronic obstructive pulmonary disease)   Weakness   Dizziness     LOS: 1 day   Kyro Joswick 09/05/2014, 10:06 AM

## 2014-09-05 NOTE — Progress Notes (Signed)
Telemetry unit removed patient going to x-ray for MRI

## 2014-09-06 LAB — GLUCOSE, CAPILLARY
GLUCOSE-CAPILLARY: 189 mg/dL — AB (ref 70–99)
Glucose-Capillary: 214 mg/dL — ABNORMAL HIGH (ref 70–99)

## 2014-09-06 MED ORDER — ALBUTEROL SULFATE (2.5 MG/3ML) 0.083% IN NEBU: 2.5000 mg | INHALATION_SOLUTION | RESPIRATORY_TRACT | Status: DC | PRN

## 2014-09-06 NOTE — Evaluation (Signed)
Physical Therapy Evaluation Patient Details Name: [name] MRN: [id number] DOB: [birth year] Today's Date: 09/06/2014   History of Present Illness  Pt is admitted with hyponatremia and hypokalemia  along with a possible UTI.  She has a hx of COPD, DM and has had a traumatic amputation of the right hand.  Clinical Impression   Pt was seen for evaluation and found to be back to her functional baseline.  She now has no dizziness and feels well.  She lives with her husband and has had chronic decreased balance .  She has a cane and husband states that he continues to encourage her to use it but she is somewhat resistant.  I have strongly recommended that she use her cane especially when outside of the home.    Follow Up Recommendations No PT follow up    Equipment Recommendations  None recommended by PT    Recommendations for Other Services   none    Precautions / Restrictions Precautions Precautions: Fall Restrictions Weight Bearing Restrictions: No      Mobility  Bed Mobility Overal bed mobility: Modified Independent                Transfers Overall transfer level: Modified independent                  Ambulation/Gait Ambulation/Gait assistance: Supervision Ambulation Distance (Feet): 150 Feet Assistive device: Straight cane Gait Pattern/deviations: Drifts right/left   Gait velocity interpretation: Below normal speed for age/gender General Gait Details: drifts right during gait which she reports is chronic...a cane helps to minimize this  Stairs            Wheelchair Mobility    Modified Rankin (Stroke Patients Only)       Balance Overall balance assessment: History of Falls;Needs assistance Sitting-balance support: No upper extremity supported;Feet supported Sitting balance-Leahy Scale: Normal     Standing balance support: No upper extremity supported Standing balance-Leahy Scale: Fair Standing balance comment: decreased balance has  been a long term problem for this pt and I have recommended that she use a cane for all gait                             Pertinent Vitals/Pain Pain Assessment: No/denies pain    Home Living Family/patient expects to be discharged to:: Private residence Living Arrangements: Spouse/significant other Available Help at Discharge: Family Type of Home: House Home Access: Ramped entrance     Home Layout: One level Home Equipment: Environmental consultant - 2 wheels;Cane - single point;Bedside commode      Prior Function Level of Independence: Independent               Hand Dominance   Dominant Hand: Left    Extremity/Trunk Assessment               Lower Extremity Assessment: Overall WFL for tasks assessed      Cervical / Trunk Assessment: Normal  Communication   Communication: No difficulties  Cognition Arousal/Alertness: Awake/alert Behavior During Therapy: WFL for tasks assessed/performed Overall Cognitive Status: Within Functional Limits for tasks assessed                      General Comments      Exercises        Assessment/Plan    PT Assessment Patent does not need any further PT services  PT Diagnosis     PT Problem  List    PT Treatment Interventions     PT Goals (Current goals can be found in the Care Plan section) Acute Rehab PT Goals PT Goal Formulation: All assessment and education complete, DC therapy    Frequency     Barriers to discharge  none      Co-evaluation               End of Session Equipment Utilized During Treatment: Gait belt Activity Tolerance: Patient tolerated treatment well Patient left: in chair;with call bell/phone within reach;with family/visitor present           Time: 6861-6837 PT Time Calculation (min) (ACUTE ONLY): 30 min   Charges:   PT Evaluation $Initial PT Evaluation Tier I: 1 Procedure     PT G CodesDemetrios Isaacs L 09/06/2014, 9:19 AM

## 2014-09-06 NOTE — Discharge Summary (Signed)
Physician Discharge Summary  [name] TTS:[id number] DOB: 02-18-1951 DOA: 09/04/2014  PCP: Maricela Curet, MD  Admit date: 09/04/2014 Discharge date: 09/06/2014   Recommendations for Outpatient Follow-up:  Patient is to follow up in office for carotid ultrasound assessment of resolution of symptoms and monitoring of electrolytes as well as glucose control Discharge Diagnoses:  Active Problems:   Hypothyroidism   Diabetes mellitus   Depression   Cardiovascular disease   COPD (chronic obstructive pulmonary disease)   Weakness   Dizziness   Discharge Condition: Good symptoms resolved  Filed Weights   09/04/14 1726 09/04/14 2202  Weight: 156 lb (70.761 kg) 162 lb 12.8 oz (73.846 kg)    History of present illness:  Patient with known psychosocial stressors insulin dependent diabetes for follow-up. Disease chronic bronchitis admitted with a dizzy episodes no true syncope CT scan MRI were essentially unrevealing and it workup was essentially unrevealing patient had resolution of symptoms she describes some enemas possible vertigo or on day of discharge had 0 symptomatology  Hospital Course:  Patient was administered admitted at bedrest monitored monitoring of vital signs as well as electrolytes had some hyponatremia which was resolved with normal saline with resolution of symptoms. She had excellent glycemic control as well as hemodynamically stable  Procedures:    Consultations:  None  Discharge Instructions  Discharge Instructions    Discharge instructions    Complete by:  As directed      Discharge patient    Complete by:  As directed             Medication List    TAKE these medications        albuterol (2.5 MG/3ML) 0.083% nebulizer solution  Commonly known as:  PROVENTIL  Take 2.5 mg by nebulization every 6 (six) hours as needed for wheezing or shortness of breath.     ARIPiprazole 5 MG tablet  Commonly known as:  ABILIFY  Take 1 tablet (5  mg total) by mouth daily.     aspirin 81 MG chewable tablet  Chew 81 mg by mouth daily.     benztropine 1 MG tablet  Commonly known as:  COGENTIN  Take 1 mg by mouth daily.     clonazePAM 2 MG tablet  Commonly known as:  KLONOPIN  Take 2 mg by mouth daily.     colesevelam 625 MG tablet  Commonly known as:  WELCHOL  Take 1 tablet (625 mg total) by mouth 3 (three) times daily.     DULoxetine 60 MG capsule  Commonly known as:  CYMBALTA  Take 1 capsule (60 mg total) by mouth daily.     fish oil-omega-3 fatty acids 1000 MG capsule  Take 2 g by mouth daily.     furosemide 20 MG tablet  Commonly known as:  LASIX  Take 20 mg by mouth daily.     gabapentin 300 MG capsule  Commonly known as:  NEURONTIN  Take 300 mg by mouth daily.     LANTUS SOLOSTAR 100 UNIT/ML Solostar Pen  Generic drug:  Insulin Glargine  Inject 35 Units into the skin at bedtime.     levothyroxine 200 MCG tablet  Commonly known as:  SYNTHROID, LEVOTHROID  Take 200 mcg by mouth daily before breakfast.     LINZESS 145 MCG Caps capsule  Generic drug:  Linaclotide  Take 145 mcg by mouth daily.     lisinopril 20 MG tablet  Commonly known as:  PRINIVIL,ZESTRIL  Take 20 mg  by mouth daily.     Melatonin 3 MG Tabs  Take 1 tablet by mouth at bedtime as needed (insomnia).     metFORMIN 500 MG tablet  Commonly known as:  GLUCOPHAGE  Take 500 mg by mouth 2 (two) times daily.     metoprolol tartrate 12.5 mg Tabs tablet  Commonly known as:  LOPRESSOR  Take 0.5 tablets (12.5 mg total) by mouth 2 (two) times daily.     NEXIUM 40 MG capsule  Generic drug:  esomeprazole  Take 40 mg by mouth daily.     nitroGLYCERIN 0.4 MG/SPRAY spray  Commonly known as:  NITROLINGUAL  Place 1 spray under the tongue every 5 (five) minutes x 3 doses as needed for chest pain.     OVER THE COUNTER MEDICATION  Place 2 sprays into the nose 2 (two) times daily. 4 Way Nasal Spray     pravastatin 40 MG tablet  Commonly known as:   PRAVACHOL  Take 40 mg by mouth daily.     QVAR 40 MCG/ACT inhaler  Generic drug:  beclomethasone  Take 2 puffs by mouth 2 (two) times daily.     traMADol 50 MG tablet  Commonly known as:  ULTRAM  Take 50 mg by mouth 3 (three) times daily.     traZODone 100 MG tablet  Commonly known as:  DESYREL  Take 1 tablet (100 mg total) by mouth at bedtime.     Vitamin D-3 1000 UNITS Caps  Take 1 capsule by mouth daily.       Allergies  Allergen Reactions  . Iohexol      Desc: CHEST TIGHTNESS,BRETHING PROBLEMS NEEDS PRE MEDS   . Tape Other (See Comments)    Tears skin  . Ciprofloxacin Rash  . Latex Rash    GLOVES   . Neomycin-Bacitracin Zn-Polymyx Rash  . Penicillins Rash  . Povidone-Iodine Rash      The results of significant diagnostics from this hospitalization (including imaging, microbiology, ancillary and laboratory) are listed below for reference.    Significant Diagnostic Studies: Ct Head Wo Contrast  09/04/2014   CLINICAL DATA:  Altered mental status with dizziness. Fall striking back of head. History of diabetes and COPD. Initial encounter.  EXAM: CT HEAD WITHOUT CONTRAST  TECHNIQUE: Contiguous axial images were obtained from the base of the skull through the vertex without intravenous contrast.  COMPARISON:  Head CT 09/27/2013.  FINDINGS: There is no evidence of acute intracranial hemorrhage, mass lesion, brain edema or extra-axial fluid collection. The ventricles and subarachnoid spaces are appropriately sized for age. There is no CT evidence of acute cortical infarction.  The visualized paranasal sinuses, mastoid air cells and middle ears are clear. The calvarium is intact.  IMPRESSION: Stable examination. No acute or significant intracranial or calvarial findings.   Electronically Signed   By: Camie Patience M.D.   On: 09/04/2014 19:17   Mr Brain Wo Contrast  09/05/2014   CLINICAL DATA:  62 year old female with dizziness for 3 days. Fall with posterior head injury  yesterday. Initial encounter.  EXAM: MRI HEAD WITHOUT CONTRAST  TECHNIQUE: Multiplanar, multiecho pulse sequences of the brain and surrounding structures were obtained without intravenous contrast.  COMPARISON:  Head CT without contrast 09/04/2014. Brain MRI 08/19/2007.  FINDINGS: Study is intermittently degraded by motion artifact despite repeated imaging attempts, due to patient uncontrollable tremor.  Cerebral volume remains within normal limits. Major intracranial vascular flow voids are stable. No restricted diffusion to suggest acute infarction. No midline shift,  mass effect, evidence of mass lesion, ventriculomegaly, extra-axial collection or acute intracranial hemorrhage. Cervicomedullary junction and pituitary are within normal limits. Negative visualized cervical spine. Minimal to mild for age T2 and FLAIR hyperintensity in the cerebral white matter has not progressed. Elsewhere gray and white matter signal are within normal limits.  Visible internal auditory structures appear normal. Stable trace mastoid fluid. Negative visualized nasopharynx. Mild paranasal sinus mucosal thickening not significantly changed. Stable orbit soft tissues. Visualized scalp soft tissues are within normal limits. No marrow edema or evidence of acute osseous abnormality. To  IMPRESSION: No acute intracranial abnormality. Stable and largely unremarkable for age non contrast MRI appearance of the brain.   Electronically Signed   By: Lars Pinks M.D.   On: 09/05/2014 12:02   Dg Chest Port 1 View  09/04/2014   CLINICAL DATA:  Generalized weakness and dizziness today. No chest pain. Initial encounter.  EXAM: PORTABLE CHEST - 1 VIEW  COMPARISON:  10/30/2013 and 09/27/2013.  FINDINGS: 2050 hr. The heart size and mediastinal contours are stable. There is stable mild accentuation of the bronchovascular markings which appears chronic. There is no pleural effusion or confluent airspace opacity. The bones appear stable. Telemetry leads  overlie the chest.  IMPRESSION: Stable examination with mild chronic interstitial prominence. No acute cardiopulmonary process.   Electronically Signed   By: Camie Patience M.D.   On: 09/04/2014 21:05    Microbiology: No results found for this or any previous visit (from the past 240 hour(s)).   Labs: Basic Metabolic Panel:  Recent Labs Lab 09/04/14 1740 09/05/14 0544  NA 133* 141  K 3.6* 3.6*  CL 93* 102  CO2 27 27  GLUCOSE 338* 218*  BUN 11 9  CREATININE 1.03 0.90  CALCIUM 9.3 8.8   Liver Function Tests:  Recent Labs Lab 09/05/14 0544  AST 16  ALT 15  ALKPHOS 143*  BILITOT 0.2*  PROT 6.2  ALBUMIN 2.5*   No results for input(s): LIPASE, AMYLASE in the last 168 hours. No results for input(s): AMMONIA in the last 168 hours. CBC:  Recent Labs Lab 09/04/14 1740 09/05/14 0544  WBC 9.1 6.7  NEUTROABS 5.1  --   HGB 11.6* 10.8*  HCT 35.1* 33.0*  MCV 87.5 88.0  PLT 220 208   Cardiac Enzymes:  Recent Labs Lab 09/04/14 1944  TROPONINI <0.30   BNP: BNP (last 3 results)  Recent Labs  09/27/13 1957  PROBNP 244.5*   CBG:  Recent Labs Lab 09/05/14 1215 09/05/14 1609 09/05/14 2119 09/06/14 0814 09/06/14 1111  GLUCAP 251* 260* 273* 189* 214*       Signed:  Kordae Buonocore M  [facility] Pager: [phone] 09/06/2014, 2:07 PM

## 2014-09-06 NOTE — Progress Notes (Signed)
Pt discharged to home with family

## 2014-09-06 NOTE — Care Management Note (Signed)
UR completed 

## 2014-09-06 NOTE — Plan of Care (Signed)
Problem: Discharge Progression Outcomes Goal: Discharge plan in place and appropriate Outcome: Completed/Met Date Met:  09/06/14 Goal: Pain controlled with appropriate interventions Outcome: Completed/Met Date Met:  09/06/14 Goal: Hemodynamically stable Outcome: Completed/Met Date Met:  16/74/25 Goal: Complications resolved/controlled Outcome: Completed/Met Date Met:  09/06/14 Goal: Tolerating diet Outcome: Completed/Met Date Met:  09/06/14 Goal: Activity appropriate for discharge plan Outcome: Completed/Met Date Met:  09/06/14 Goal: Other Discharge Outcomes/Goals Outcome: Completed/Met Date Met:  09/06/14

## 2014-09-06 NOTE — Care Management Note (Signed)
    Page 1 of 1   09/06/2014     11:12:15 AM CARE MANAGEMENT NOTE 09/06/2014  Patient:  [name], [name]   Account Number:  [id number]  Date Initiated:  09/06/2014  Documentation initiated by:  Theophilus Kinds  Subjective/Objective Assessment:   Pt admitted dizziness. Pt lives with her husband and will return home at discharge. Pt has a cane for home use.     Action/Plan:   No CM needs noted. Anticipate discharge today.   Anticipated DC Date:  09/06/2014   Anticipated DC Plan:  [place]  CM consult      Choice offered to / List presented to:             Status of service:  Completed, signed off Medicare Important Message given?   (If response is "NO", the following Medicare IM given date fields will be blank) Date Medicare IM given:   Medicare IM given by:   Date Additional Medicare IM given:   Additional Medicare IM given by:    Discharge Disposition:  HOME/SELF CARE  Per UR Regulation:    If discussed at Long Length of Stay Meetings, dates discussed:    Comments:  09/06/14 [place], RN BSN CM

## 2014-09-06 NOTE — Progress Notes (Signed)
Inpatient Diabetes Program Recommendations  AACE/ADA: New Consensus Statement on Inpatient Glycemic Control (2013)  Target Ranges:  Prepandial:   less than 140 mg/dL      Peak postprandial:   less than 180 mg/dL (1-2 hours)      Critically ill patients:  140 - 180 mg/dL   Results for COBIE, LEIDNER (MRN [id number]) as of 09/06/2014 10:07  Ref. Range 09/05/2014 07:17 09/05/2014 12:15 09/05/2014 16:09 09/05/2014 21:19 09/06/2014 08:14  Glucose-Capillary Latest Range: 70-99 mg/dL 237 (H) 251 (H) 260 (H) 273 (H) 189 (H)   Diabetes history: DM2 Outpatient Diabetes medications: Lantus 35 units QHS, Metformin 500 mg BID Current orders for Inpatient glycemic control: Lantus 35 units QHS, Novolog 0-9 units AC  Inpatient Diabetes Program Recommendations Correction (SSI): Please consider adding Novolog bedtime correction scale. Insulin - Meal Coverage: Please consider ordering Novolog 5 units TID with meals for meal coverage if patient eats at least 50% of meal.  Thanks, Barnie Alderman, RN, MSN, CCRN, CDE Diabetes Coordinator Inpatient Diabetes Program [phone] (Team Pager) [phone] (AP office) [phone] [facility] office)

## 2014-09-07 ENCOUNTER — Other Ambulatory Visit (HOSPITAL_COMMUNITY): Payer: Self-pay | Admitting: Family Medicine

## 2014-09-07 ENCOUNTER — Ambulatory Visit (HOSPITAL_COMMUNITY)
Admission: RE | Admit: 2014-09-07 | Discharge: 2014-09-07 | Disposition: A | Discharge status: Home / Self Care | Payer: Medicare HMO | Source: Ambulatory Visit | Attending: Family Medicine | Admitting: Family Medicine

## 2014-09-07 DIAGNOSIS — W19XXXA Unspecified fall, initial encounter: Secondary | ICD-10-CM | POA: Insufficient documentation

## 2014-09-07 DIAGNOSIS — W19XXXD Unspecified fall, subsequent encounter: Secondary | ICD-10-CM

## 2014-09-07 DIAGNOSIS — S3992XA Unspecified injury of lower back, initial encounter: Secondary | ICD-10-CM | POA: Insufficient documentation

## 2014-09-07 DIAGNOSIS — Y92009 Unspecified place in unspecified non-institutional (private) residence as the place of occurrence of the external cause: Secondary | ICD-10-CM

## 2014-09-07 DIAGNOSIS — M533 Sacrococcygeal disorders, not elsewhere classified: Secondary | ICD-10-CM | POA: Insufficient documentation

## 2014-09-07 DIAGNOSIS — M545 Low back pain: Secondary | ICD-10-CM | POA: Insufficient documentation

## 2014-09-07 LAB — URINE CULTURE
Colony Count: NO GROWTH
Culture: NO GROWTH

## 2014-09-17 ENCOUNTER — Ambulatory Visit (HOSPITAL_COMMUNITY): Payer: Self-pay | Admitting: Psychiatry

## 2014-09-17 ENCOUNTER — Telehealth (HOSPITAL_COMMUNITY): Payer: Self-pay | Admitting: *Deleted

## 2014-09-17 NOTE — Telephone Encounter (Signed)
Called pt to resch her appt due to pt not being able to wait for her scheduled appt due to network being down and had another appt to go to. Informed pt that I was going to call her when the network came back up. Per pt, she needed refills. Called pt at 11:41am, 3:40pm and 4:38pm. Lmtcb, number provided

## 2014-09-22 ENCOUNTER — Ambulatory Visit (INDEPENDENT_AMBULATORY_CARE_PROVIDER_SITE_OTHER): Payer: Medicare HMO | Admitting: Psychiatry

## 2014-09-22 ENCOUNTER — Encounter (HOSPITAL_COMMUNITY): Payer: Self-pay | Admitting: Psychiatry

## 2014-09-22 VITALS — BP 127/66 | HR 89 | Ht 66.0 in | Wt 158.6 lb

## 2014-09-22 DIAGNOSIS — F32A Depression, unspecified: Secondary | ICD-10-CM

## 2014-09-22 DIAGNOSIS — F39 Unspecified mood [affective] disorder: Secondary | ICD-10-CM

## 2014-09-22 DIAGNOSIS — F319 Bipolar disorder, unspecified: Secondary | ICD-10-CM

## 2014-09-22 DIAGNOSIS — F329 Major depressive disorder, single episode, unspecified: Secondary | ICD-10-CM

## 2014-09-22 MED ORDER — ARIPIPRAZOLE 5 MG PO TABS: 5.0000 mg | ORAL_TABLET | Freq: Every day | ORAL | Status: DC

## 2014-09-22 MED ORDER — BENZTROPINE MESYLATE 1 MG PO TABS: 1.0000 mg | ORAL_TABLET | Freq: Every day | ORAL | Status: DC

## 2014-09-22 MED ORDER — DULOXETINE HCL 60 MG PO CPEP: 60.0000 mg | ORAL_CAPSULE | Freq: Every day | ORAL | Status: DC

## 2014-09-22 MED ORDER — ALPRAZOLAM 2 MG PO TABS: 2.0000 mg | ORAL_TABLET | Freq: Every evening | ORAL | Status: DC | PRN
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
## 2014-09-27 ENCOUNTER — Ambulatory Visit (INDEPENDENT_AMBULATORY_CARE_PROVIDER_SITE_OTHER): Payer: Medicare HMO | Admitting: Psychiatry

## 2014-09-27 DIAGNOSIS — F329 Major depressive disorder, single episode, unspecified: Secondary | ICD-10-CM

## 2014-09-27 DIAGNOSIS — F32A Depression, unspecified: Secondary | ICD-10-CM

## 2014-09-27 NOTE — Patient Instructions (Signed)
Discussed orally 
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
## 2014-10-04 ENCOUNTER — Ambulatory Visit: Payer: Medicare HMO | Admitting: Gastroenterology

## 2014-10-04 ENCOUNTER — Encounter: Payer: Self-pay | Admitting: Gastroenterology

## 2014-10-04 ENCOUNTER — Telehealth: Payer: Self-pay | Admitting: Internal Medicine

## 2014-10-04 NOTE — Telephone Encounter (Signed)
PATIENT WAS A NO SHOW AND LETTER SENT  °

## 2014-10-11 ENCOUNTER — Ambulatory Visit (HOSPITAL_COMMUNITY): Payer: Self-pay | Admitting: Psychiatry

## 2014-10-28 ENCOUNTER — Ambulatory Visit (HOSPITAL_COMMUNITY): Payer: Self-pay | Admitting: Psychiatry

## 2014-10-29 ENCOUNTER — Encounter (HOSPITAL_COMMUNITY): Payer: Self-pay | Admitting: Psychiatry

## 2015-01-13 ENCOUNTER — Encounter (HOSPITAL_COMMUNITY): Payer: Self-pay | Admitting: Psychiatry

## 2015-02-07 ENCOUNTER — Emergency Department (HOSPITAL_COMMUNITY): Payer: Medicare HMO

## 2015-02-07 ENCOUNTER — Encounter (HOSPITAL_COMMUNITY): Payer: Self-pay | Admitting: Emergency Medicine

## 2015-02-07 ENCOUNTER — Emergency Department (HOSPITAL_COMMUNITY)
Admission: EM | Admit: 2015-02-07 | Discharge: 2015-02-07 | Disposition: A | Discharge status: Home / Self Care | Payer: Medicare HMO | Attending: Emergency Medicine | Admitting: Emergency Medicine

## 2015-02-07 DIAGNOSIS — K219 Gastro-esophageal reflux disease without esophagitis: Secondary | ICD-10-CM | POA: Diagnosis not present

## 2015-02-07 DIAGNOSIS — S61402A Unspecified open wound of left hand, initial encounter: Secondary | ICD-10-CM | POA: Insufficient documentation

## 2015-02-07 DIAGNOSIS — I2510 Atherosclerotic heart disease of native coronary artery without angina pectoris: Secondary | ICD-10-CM | POA: Insufficient documentation

## 2015-02-07 DIAGNOSIS — S51801A Unspecified open wound of right forearm, initial encounter: Secondary | ICD-10-CM | POA: Insufficient documentation

## 2015-02-07 DIAGNOSIS — Z720 Tobacco use: Secondary | ICD-10-CM | POA: Diagnosis not present

## 2015-02-07 DIAGNOSIS — Z7982 Long term (current) use of aspirin: Secondary | ICD-10-CM | POA: Diagnosis not present

## 2015-02-07 DIAGNOSIS — E785 Hyperlipidemia, unspecified: Secondary | ICD-10-CM | POA: Insufficient documentation

## 2015-02-07 DIAGNOSIS — Y998 Other external cause status: Secondary | ICD-10-CM | POA: Diagnosis not present

## 2015-02-07 DIAGNOSIS — S81002A Unspecified open wound, left knee, initial encounter: Secondary | ICD-10-CM | POA: Insufficient documentation

## 2015-02-07 DIAGNOSIS — J449 Chronic obstructive pulmonary disease, unspecified: Secondary | ICD-10-CM | POA: Insufficient documentation

## 2015-02-07 DIAGNOSIS — S0990XA Unspecified injury of head, initial encounter: Secondary | ICD-10-CM | POA: Insufficient documentation

## 2015-02-07 DIAGNOSIS — T148 Other injury of unspecified body region: Secondary | ICD-10-CM | POA: Insufficient documentation

## 2015-02-07 DIAGNOSIS — S199XXA Unspecified injury of neck, initial encounter: Secondary | ICD-10-CM | POA: Insufficient documentation

## 2015-02-07 DIAGNOSIS — E109 Type 1 diabetes mellitus without complications: Secondary | ICD-10-CM | POA: Diagnosis not present

## 2015-02-07 DIAGNOSIS — T07XXXA Unspecified multiple injuries, initial encounter: Secondary | ICD-10-CM

## 2015-02-07 DIAGNOSIS — T148XXA Other injury of unspecified body region, initial encounter: Secondary | ICD-10-CM

## 2015-02-07 DIAGNOSIS — Z8669 Personal history of other diseases of the nervous system and sense organs: Secondary | ICD-10-CM | POA: Insufficient documentation

## 2015-02-07 DIAGNOSIS — Y92410 Unspecified street and highway as the place of occurrence of the external cause: Secondary | ICD-10-CM | POA: Insufficient documentation

## 2015-02-07 DIAGNOSIS — Z79899 Other long term (current) drug therapy: Secondary | ICD-10-CM | POA: Insufficient documentation

## 2015-02-07 DIAGNOSIS — Y9389 Activity, other specified: Secondary | ICD-10-CM | POA: Diagnosis not present

## 2015-02-07 DIAGNOSIS — Z23 Encounter for immunization: Secondary | ICD-10-CM | POA: Insufficient documentation

## 2015-02-07 DIAGNOSIS — Z86718 Personal history of other venous thrombosis and embolism: Secondary | ICD-10-CM | POA: Diagnosis not present

## 2015-02-07 DIAGNOSIS — E039 Hypothyroidism, unspecified: Secondary | ICD-10-CM | POA: Insufficient documentation

## 2015-02-07 DIAGNOSIS — F329 Major depressive disorder, single episode, unspecified: Secondary | ICD-10-CM | POA: Diagnosis not present

## 2015-02-07 DIAGNOSIS — Z87442 Personal history of urinary calculi: Secondary | ICD-10-CM | POA: Insufficient documentation

## 2015-02-07 MED ORDER — LIDOCAINE-EPINEPHRINE (PF) 1 %-1:200000 IJ SOLN: 20.0000 mL | Freq: Once | INTRAMUSCULAR | Status: DC

## 2015-02-07 MED ORDER — HYDROCODONE-ACETAMINOPHEN 5-325 MG PO TABS: 1.0000 | ORAL_TABLET | ORAL | Status: DC | PRN

## 2015-02-07 MED ORDER — HYDROCODONE-ACETAMINOPHEN 5-325 MG PO TABS
2.0000 | ORAL_TABLET | Freq: Once | ORAL | Status: AC
  Administered 2015-02-07: 2 via ORAL
  Filled 2015-02-07: qty 2

## 2015-02-07 MED ORDER — TETANUS-DIPHTH-ACELL PERTUSSIS 5-2.5-18.5 LF-MCG/0.5 IM SUSP
0.5000 mL | Freq: Once | INTRAMUSCULAR | Status: AC
  Administered 2015-02-07: 0.5 mL via INTRAMUSCULAR
  Filled 2015-02-07: qty 0.5

## 2015-02-07 MED ORDER — FENTANYL CITRATE (PF) 100 MCG/2ML IJ SOLN: 100.0000 ug | Freq: Once | INTRAMUSCULAR | Status: DC
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
## 2015-02-07 NOTE — Discharge Instructions (Signed)
Head Injury You have received a head injury. It does not appear serious at this time. Headaches and vomiting are common following head injury. It should be easy to awaken from sleeping. Sometimes it is necessary for you to stay in the emergency department for a while for observation. Sometimes admission to the hospital may be needed. After injuries such as yours, most problems occur within the first 24 hours, but side effects may occur up to 7-10 days after the injury. It is important for you to carefully monitor your condition and contact your health care provider or seek immediate medical care if there is a change in your condition. WHAT ARE THE TYPES OF HEAD INJURIES? Head injuries can be as minor as a bump. Some head injuries can be more severe. More severe head injuries include:  A jarring injury to the brain (concussion).  A bruise of the brain (contusion). This mean there is bleeding in the brain that can cause swelling.  A cracked skull (skull fracture).  Bleeding in the brain that collects, clots, and forms a bump (hematoma). WHAT CAUSES A HEAD INJURY? A serious head injury is most likely to happen to someone who is in a car wreck and is not wearing a seat belt. Other causes of major head injuries include bicycle or motorcycle accidents, sports injuries, and falls. HOW ARE HEAD INJURIES DIAGNOSED? A complete history of the event leading to the injury and your current symptoms will be helpful in diagnosing head injuries. Many times, pictures of the brain, such as CT or MRI are needed to see the extent of the injury. Often, an overnight hospital stay is necessary for observation.  WHEN SHOULD I SEEK IMMEDIATE MEDICAL CARE?  You should get help right away if:  You have confusion or drowsiness.  You feel sick to your stomach (nauseous) or have continued, forceful vomiting.  You have dizziness or unsteadiness that is getting worse.  You have severe, continued headaches not relieved by  medicine. Only take over-the-counter or prescription medicines for pain, fever, or discomfort as directed by your health care provider.  You do not have normal function of the arms or legs or are unable to walk.  You notice changes in the black spots in the center of the colored part of your eye (pupil).  You have a clear or bloody fluid coming from your nose or ears.  You have a loss of vision. During the next 24 hours after the injury, you must stay with someone who can watch you for the warning signs. This person should contact local emergency services (911 in the [place] if you have seizures, you become unconscious, or you are unable to wake up. HOW CAN I PREVENT A HEAD INJURY IN THE FUTURE? The most important factor for preventing major head injuries is avoiding motor vehicle accidents. To minimize the potential for damage to your head, it is crucial to wear seat belts while riding in motor vehicles. Wearing helmets while bike riding and playing collision sports (like football) is also helpful. Also, avoiding dangerous activities around the house will further help reduce your risk of head injury.  WHEN CAN I RETURN TO NORMAL ACTIVITIES AND ATHLETICS? You should be reevaluated by your health care provider before returning to these activities. If you have any of the following symptoms, you should not return to activities or contact sports until 1 week after the symptoms have stopped:  Persistent headache.  Dizziness or vertigo.  Poor attention and concentration.  Confusion.  Memory problems.  Nausea or vomiting.  Fatigue or tire easily.  Irritability.  Intolerant of bright lights or loud noises.  Anxiety or depression.  Disturbed sleep. MAKE SURE YOU:   Understand these instructions.  Will watch your condition.  Will get help right away if you are not doing well or get worse. Document Released: 10/01/2005 Document Revised: 10/06/2013 Document Reviewed:  06/08/2013 [facility] Patient Information 2015 [place], [place]. This information is not intended to replace advice given to you by your health care provider. Make sure you discuss any questions you have with your health care provider.  Motor Vehicle Collision It is common to have multiple bruises and sore muscles after a motor vehicle collision (MVC). These tend to feel worse for the first 24 hours. You may have the most stiffness and soreness over the first several hours. You may also feel worse when you wake up the first morning after your collision. After this point, you will usually begin to improve with each day. The speed of improvement often depends on the severity of the collision, the number of injuries, and the location and nature of these injuries. HOME CARE INSTRUCTIONS  Put ice on the injured area.  Put ice in a plastic bag.  Place a towel between your skin and the bag.  Leave the ice on for 15-20 minutes, 3-4 times a day, or as directed by your health care provider.  Drink enough fluids to keep your urine clear or pale yellow. Do not drink alcohol.  Take a warm shower or bath once or twice a day. This will increase blood flow to sore muscles.  You may return to activities as directed by your caregiver. Be careful when lifting, as this may aggravate neck or back pain.  Only take over-the-counter or prescription medicines for pain, discomfort, or fever as directed by your caregiver. Do not use aspirin. This may increase bruising and bleeding. SEEK IMMEDIATE MEDICAL CARE IF:  You have numbness, tingling, or weakness in the arms or legs.  You develop severe headaches not relieved with medicine.  You have severe neck pain, especially tenderness in the middle of the back of your neck.  You have changes in bowel or bladder control.  There is increasing pain in any area of the body.  You have shortness of breath, light-headedness, dizziness, or fainting.  You have chest  pain.  You feel sick to your stomach (nauseous), throw up (vomit), or sweat.  You have increasing abdominal discomfort.  There is blood in your urine, stool, or vomit.  You have pain in your shoulder (shoulder strap areas).  You feel your symptoms are getting worse. MAKE SURE YOU:  Understand these instructions.  Will watch your condition.  Will get help right away if you are not doing well or get worse. Document Released: 10/01/2005 Document Revised: 02/15/2014 Document Reviewed: 02/28/2011 [facility] Patient Information 2015 [place], [place]. This information is not intended to replace advice given to you by your health care provider. Make sure you discuss any questions you have with your health care provider.   Contusion A contusion is a deep bruise. Contusions are the result of an injury that caused bleeding under the skin. The contusion may turn blue, purple, or yellow. Minor injuries will give you a painless contusion, but more severe contusions may stay painful and swollen for a few weeks.  CAUSES  A contusion is usually caused by a blow, trauma, or direct force to an area of the body. SYMPTOMS  Swelling and redness of the injured area.  Bruising of the injured area.  Tenderness and soreness of the injured area.  Pain. DIAGNOSIS  The diagnosis can be made by taking a history and physical exam. An X-ray, CT scan, or MRI may be needed to determine if there were any associated injuries, such as fractures. TREATMENT  Specific treatment will depend on what area of the body was injured. In general, the best treatment for a contusion is resting, icing, elevating, and applying cold compresses to the injured area. Over-the-counter medicines may also be recommended for pain control. Ask your caregiver what the best treatment is for your contusion. HOME CARE INSTRUCTIONS   Put ice on the injured area.  Put ice in a plastic bag.  Place a towel between your skin and the  bag.  Leave the ice on for 15-20 minutes, 3-4 times a day, or as directed by your health care provider.  Only take over-the-counter or prescription medicines for pain, discomfort, or fever as directed by your caregiver. Your caregiver may recommend avoiding anti-inflammatory medicines (aspirin, ibuprofen, and naproxen) for 48 hours because these medicines may increase bruising.  Rest the injured area.  If possible, elevate the injured area to reduce swelling. SEEK IMMEDIATE MEDICAL CARE IF:   You have increased bruising or swelling.  You have pain that is getting worse.  Your swelling or pain is not relieved with medicines. MAKE SURE YOU:   Understand these instructions.  Will watch your condition.  Will get help right away if you are not doing well or get worse. Document Released: 07/11/2005 Document Revised: 10/06/2013 Document Reviewed: 08/06/2011 [facility] Patient Information 2015 [place], [place]. This information is not intended to replace advice given to you by your health care provider. Make sure you discuss any questions you have with your health care provider.  RICE: Routine Care for Injuries The routine care of many injuries includes Rest, Ice, Compression, and Elevation (RICE). HOME CARE INSTRUCTIONS  Rest is needed to allow your body to heal. Routine activities can usually be resumed when comfortable. Injured tendons and bones can take up to 6 weeks to heal. Tendons are the cord-like structures that attach muscle to bone.  Ice following an injury helps keep the swelling down and reduces pain.  Put ice in a plastic bag.  Place a towel between your skin and the bag.  Leave the ice on for 15-20 minutes, 3-4 times a day, or as directed by your health care provider. Do this while awake, for the first 24 to 48 hours. After that, continue as directed by your caregiver.  Compression helps keep swelling down. It also gives support and helps with discomfort. If an elastic  bandage has been applied, it should be removed and reapplied every 3 to 4 hours. It should not be applied tightly, but firmly enough to keep swelling down. Watch fingers or toes for swelling, bluish discoloration, coldness, numbness, or excessive pain. If any of these problems occur, remove the bandage and reapply loosely. Contact your caregiver if these problems continue.  Elevation helps reduce swelling and decreases pain. With extremities, such as the arms, hands, legs, and feet, the injured area should be placed near or above the level of the heart, if possible. SEEK IMMEDIATE MEDICAL CARE IF:  You have persistent pain and swelling.  You develop redness, numbness, or unexpected weakness.  Your symptoms are getting worse rather than improving after several days. These symptoms may indicate that further evaluation or further X-rays  are needed. Sometimes, X-rays may not show a small broken bone (fracture) until 1 week or 10 days later. Make a follow-up appointment with your caregiver. Ask when your X-ray results will be ready. Make sure you get your X-ray results. Document Released: 01/13/2001 Document Revised: 10/06/2013 Document Reviewed: 03/02/2011 [facility] Patient Information 2015 [place], [place]. This information is not intended to replace advice given to you by your health care provider. Make sure you discuss any questions you have with your health care provider.

## 2015-04-07 ENCOUNTER — Telehealth (HOSPITAL_COMMUNITY): Payer: Self-pay | Admitting: *Deleted

## 2015-05-04 ENCOUNTER — Telehealth (HOSPITAL_COMMUNITY): Payer: Self-pay | Admitting: *Deleted

## 2015-05-05 ENCOUNTER — Ambulatory Visit (HOSPITAL_COMMUNITY): Payer: Self-pay | Admitting: Psychiatry

## 2015-05-20 IMAGING — CT CT HEAD W/O CM
1 series · 16 of 30 positions shown, 20 images · non-contrast
Comparison: Head CT 09/27/2013.

CLINICAL DATA: Altered mental status with dizziness. Fall striking
back of head. History of diabetes and COPD. Initial encounter.

EXAM:
CT HEAD WITHOUT CONTRAST
TECHNIQUE: Contiguous axial images were obtained from the base of the skull
through the vertex without intravenous contrast.

[Series 2: headtrauma 4.8 h37s · axial · 0.47mm/px · z∈[+1130,+1290]mm · 16 of 36 slices shown, 20 images]
[im 2/36  brain]
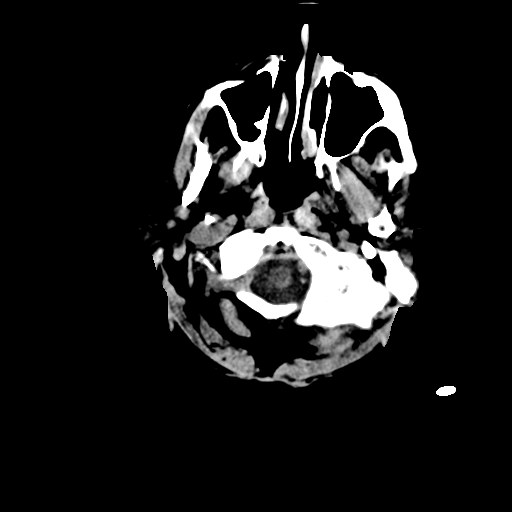
[im 2/36  bone]
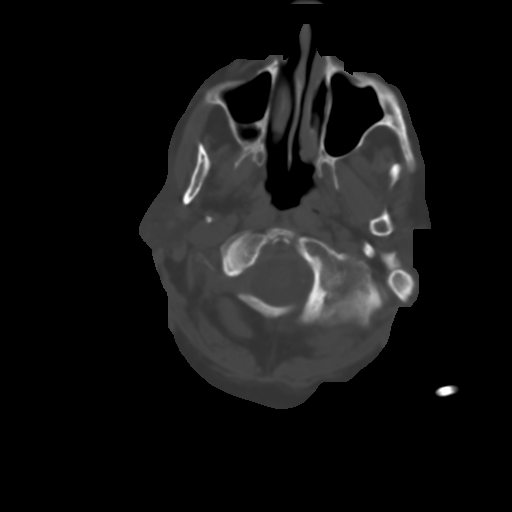
[im 4/36  brain]
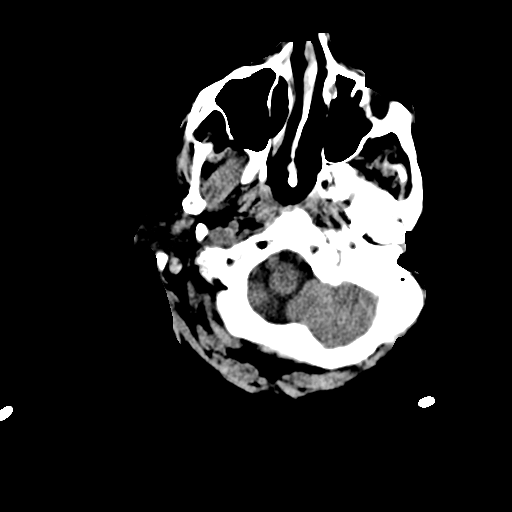
[im 7/36  brain]
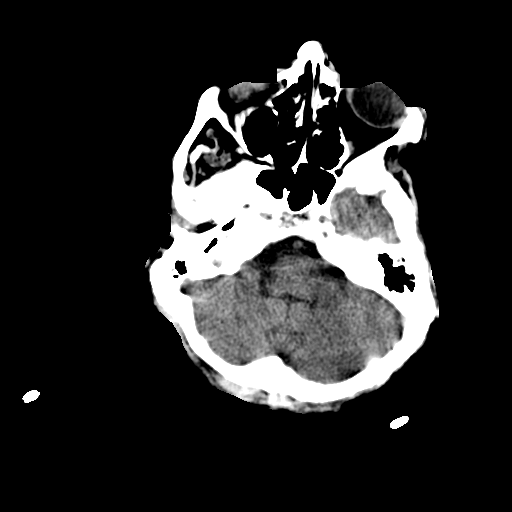
[im 9/36  brain]
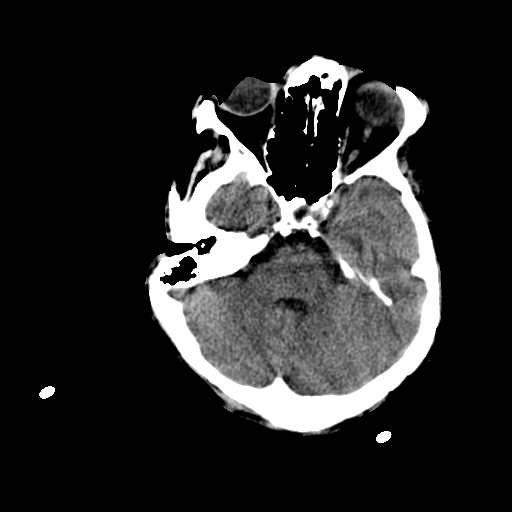
[im 10/36  brain]
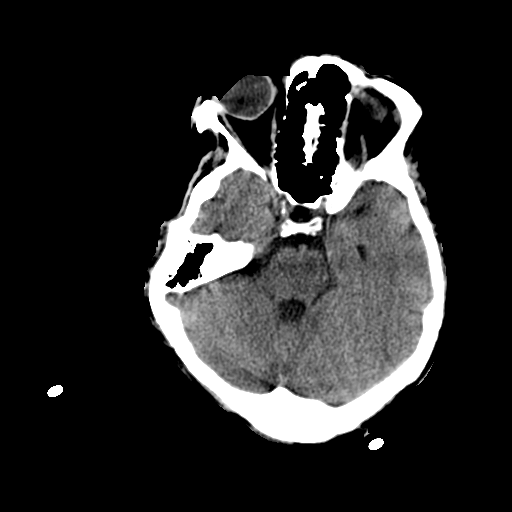
[im 10/36  bone]
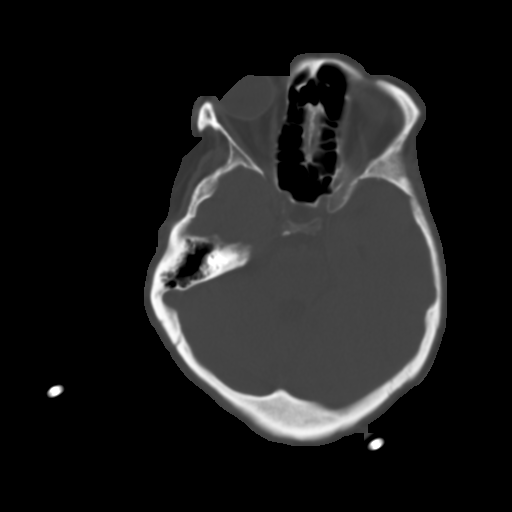
[im 13/36  brain]
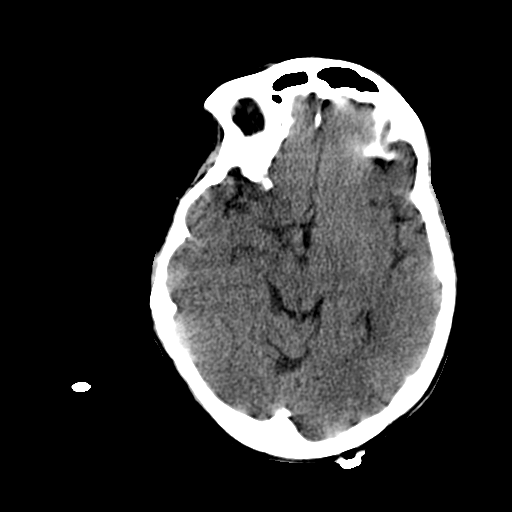
[im 15/36  brain]
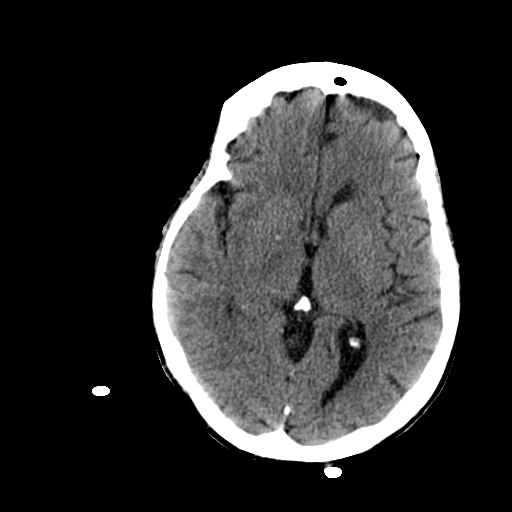
[im 17/36  brain]
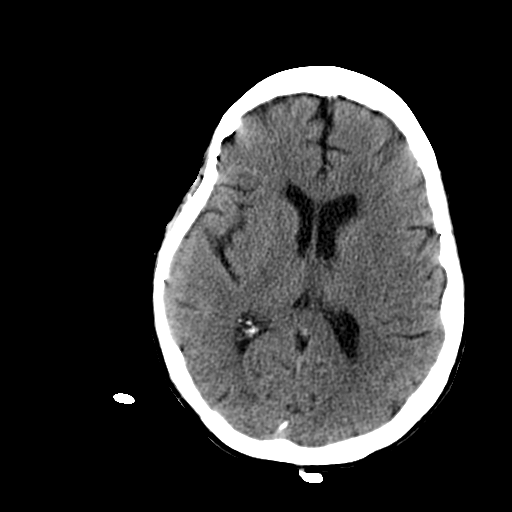
[im 19/36  brain]
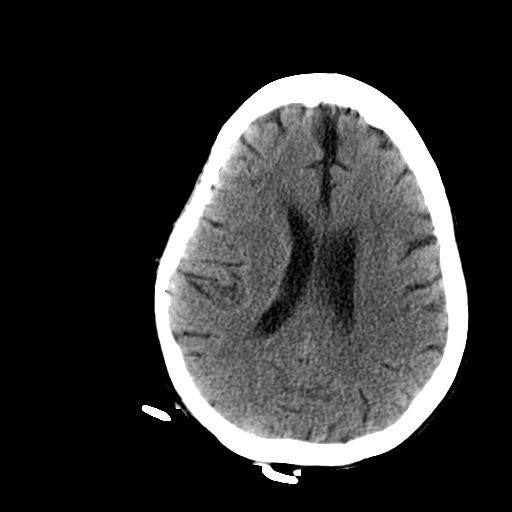
[im 19/36  bone]
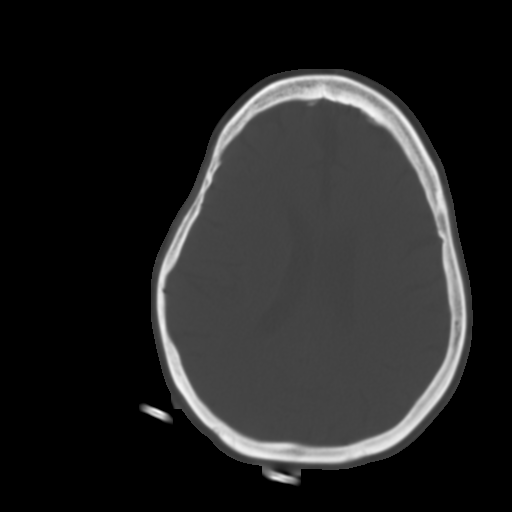
[im 21/36  brain]
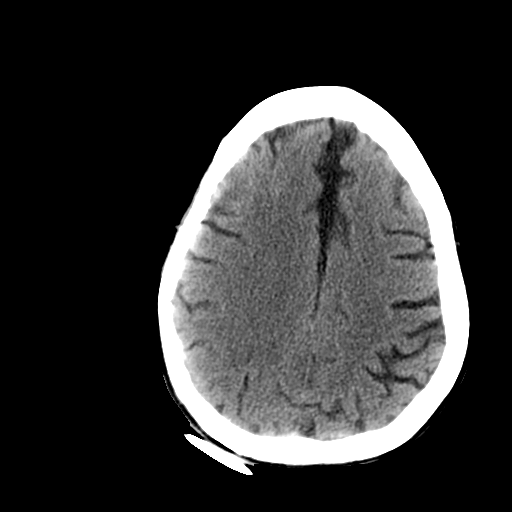
[im 23/36  brain]
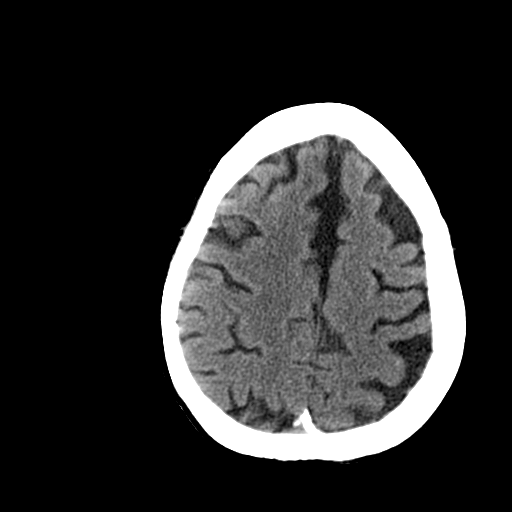
[im 26/36  brain]
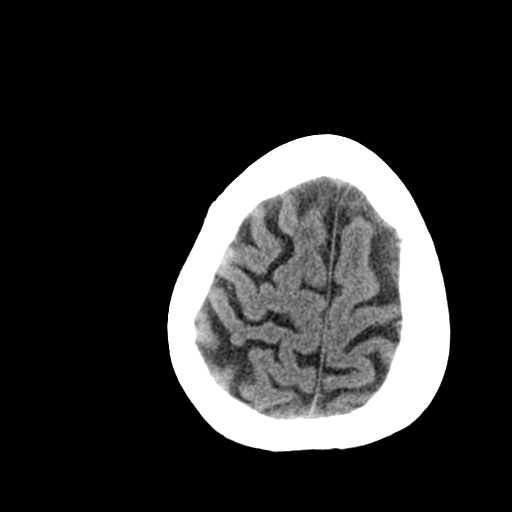
[im 27/36  brain]
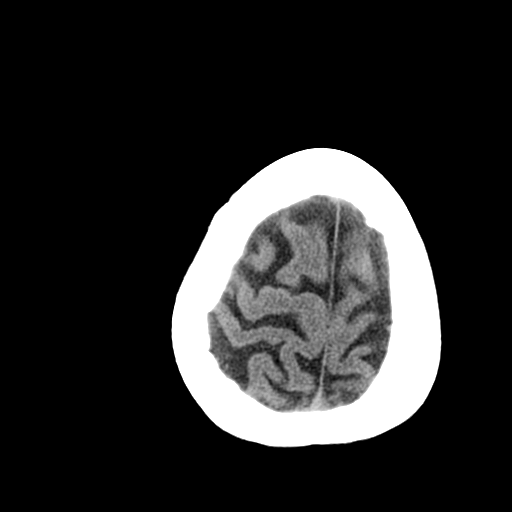
[im 27/36  bone]
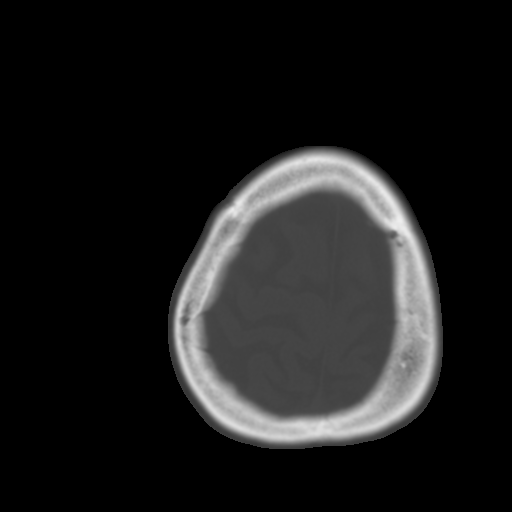
[im 29/36  brain]
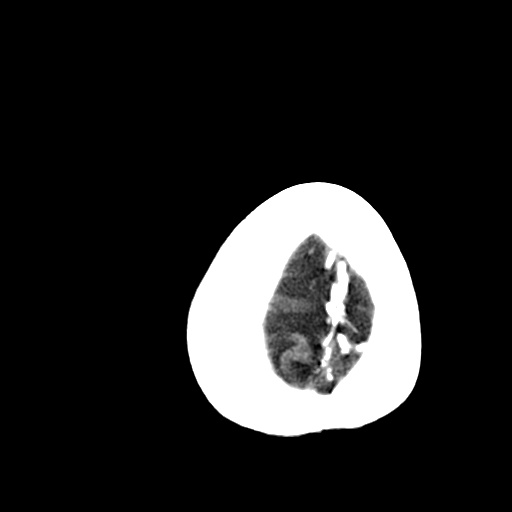
[im 32/36  brain]
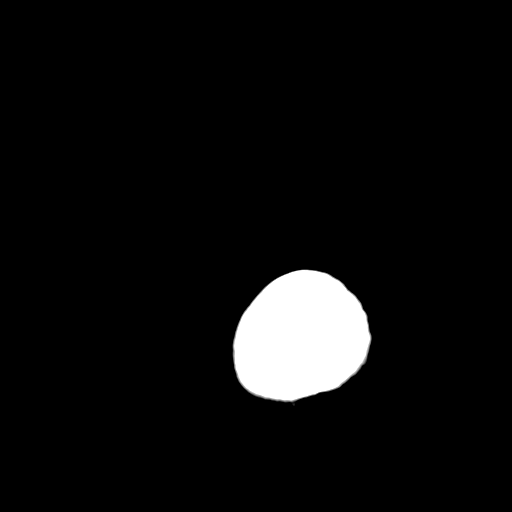
[im 34/36  brain]
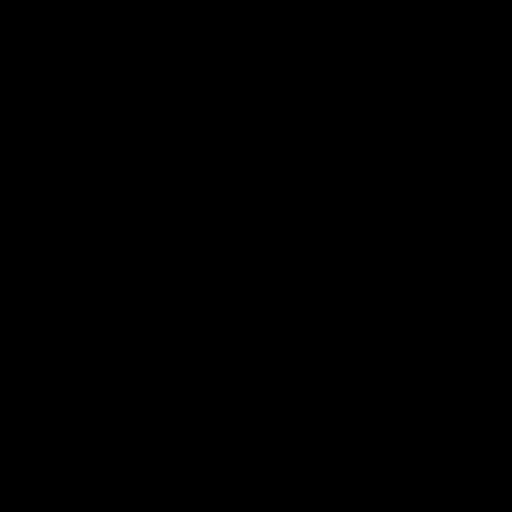

[16 of 30 positions shown; findings below may reference images not displayed]

FINDINGS: There is no evidence of acute intracranial hemorrhage, mass lesion,
brain edema or extra-axial fluid collection. The ventricles and
subarachnoid spaces are appropriately sized for age. There is no CT
evidence of acute cortical infarction.

The visualized paranasal sinuses, mastoid air cells and middle ears
are clear. The calvarium is intact.
IMPRESSION: Stable examination. No acute or significant intracranial or
calvarial findings.

## 2015-05-20 IMAGING — CR DG CHEST 1V PORT
1 series · 1 of 1 positions shown · non-contrast
Comparison: 10/30/2013 and 09/27/2013.

CLINICAL DATA: Generalized weakness and dizziness today. No chest
pain. Initial encounter.

EXAM:
PORTABLE CHEST - 1 VIEW

[portable]
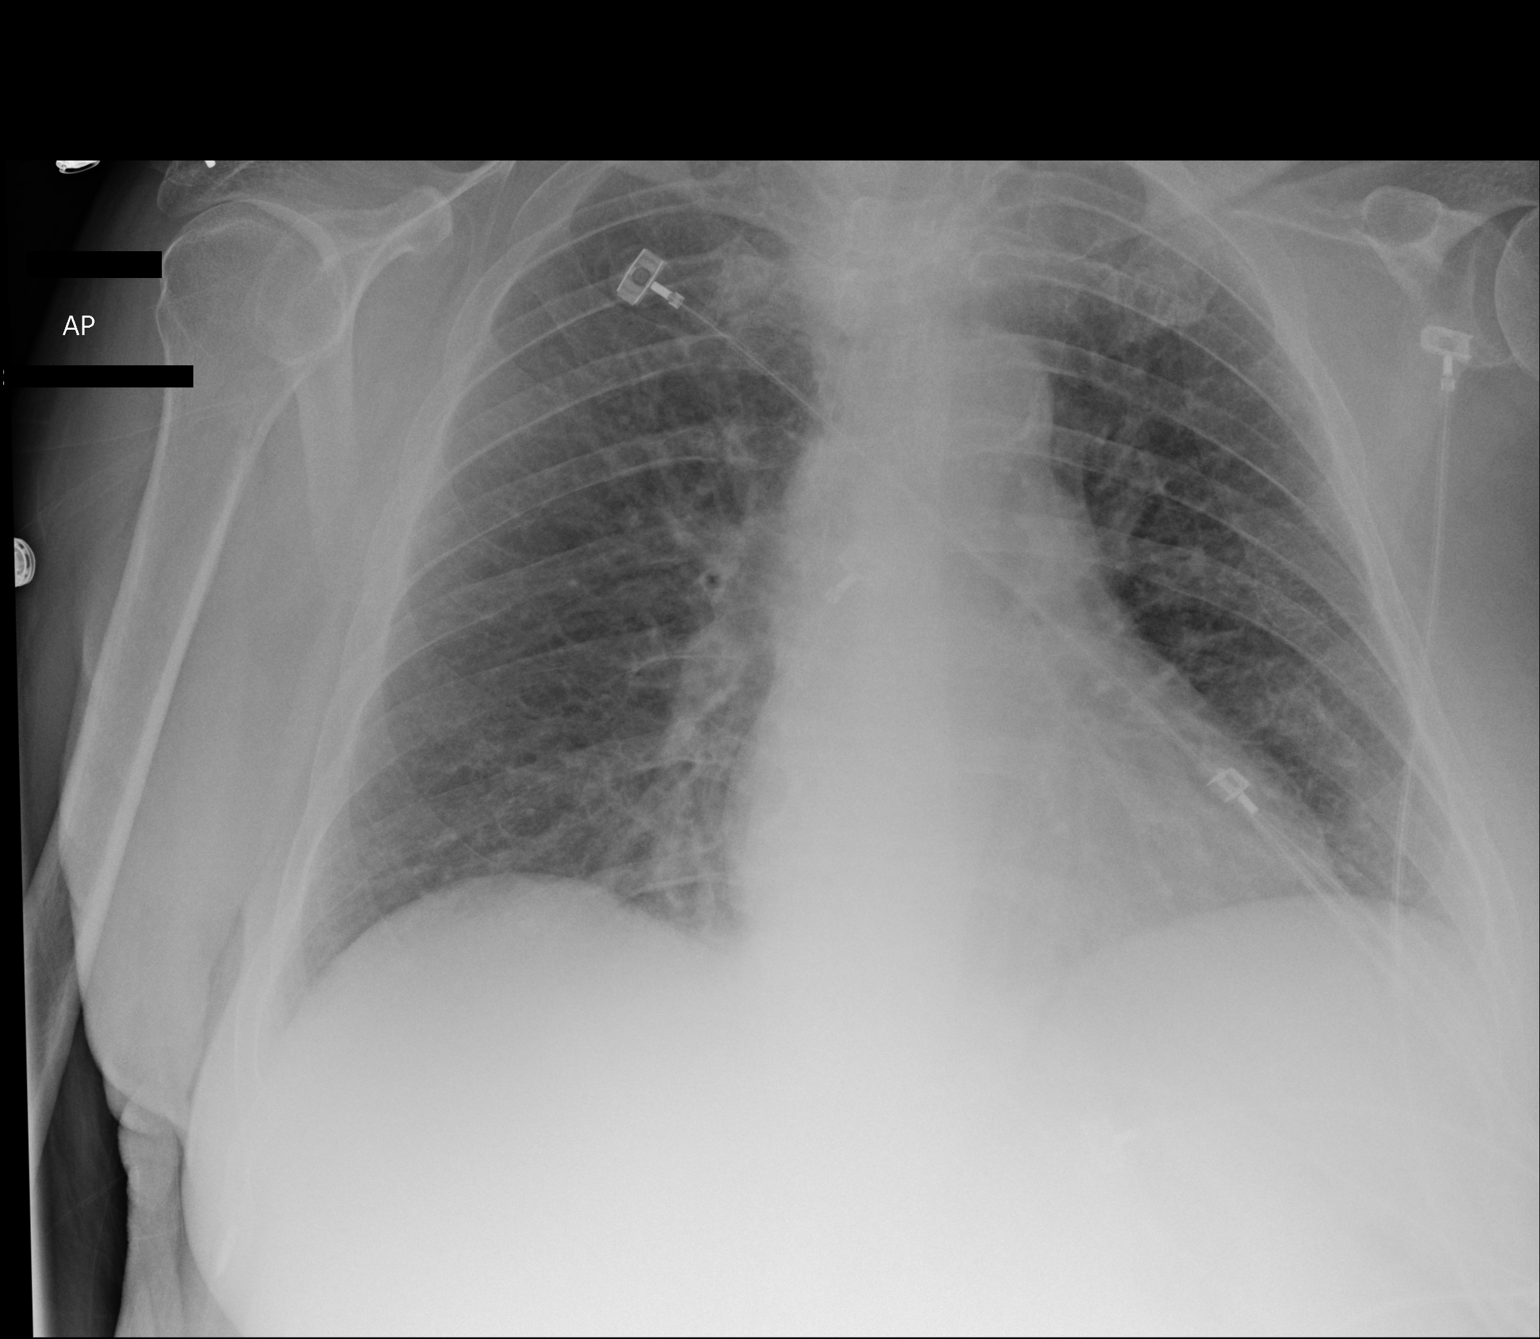

[1 of 1 positions shown; findings below may reference images not displayed]

FINDINGS: 1464 hr. The heart size and mediastinal contours are stable. There
is stable mild accentuation of the bronchovascular markings which
appears chronic. There is no pleural effusion or confluent airspace
opacity. The bones appear stable. Telemetry leads overlie the chest.
IMPRESSION: Stable examination with mild chronic interstitial prominence. No
acute cardiopulmonary process.

## 2015-05-21 IMAGING — MR MR HEAD W/O CM
7 of 10 series · 30 of 48 positions shown · non-contrast
Comparison: Head CT without contrast 09/04/2014. Brain MRI
08/19/2007.

CLINICAL DATA: 62-year-old female with dizziness for 3 days. Fall
with posterior head injury yesterday. Initial encounter.

EXAM:
MRI HEAD WITHOUT CONTRAST
TECHNIQUE: Multiplanar, multiecho pulse sequences of the brain and surrounding
structures were obtained without intravenous contrast.

[Series 2: [id number] · sagittal · 5.0mm · 0.45mm/px · 4 of 22 slices shown]
[im 1/22]
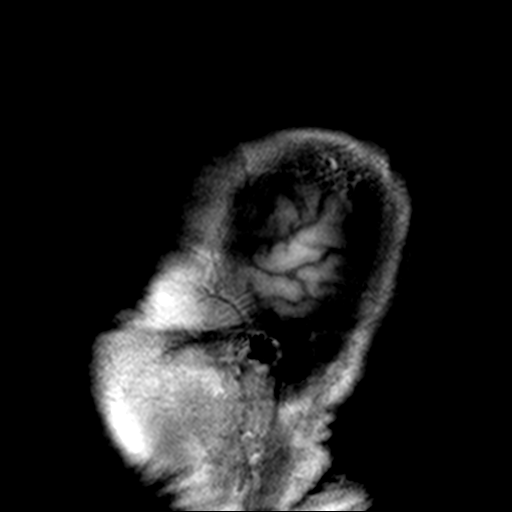
[im 8/22]
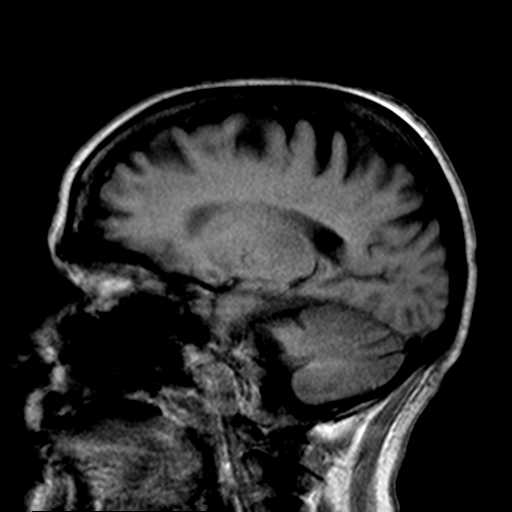
[im 15/22]
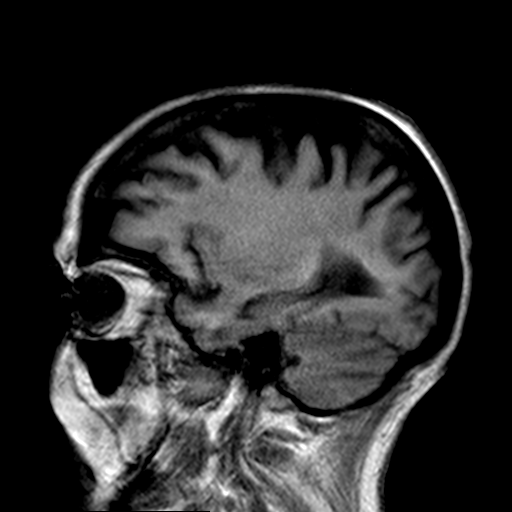
[im 22/22]
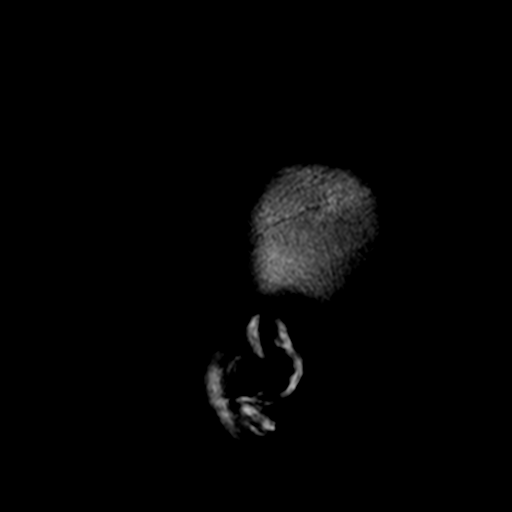

[Series 5: T2 · axial · 5.0mm · 0.75mm/px · z∈[-72,+71]mm · 3 of 23 slices shown (1 of 2)]
[im 1/23]
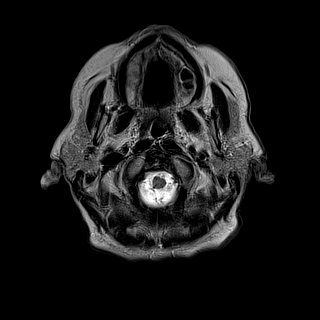
[im 12/23]
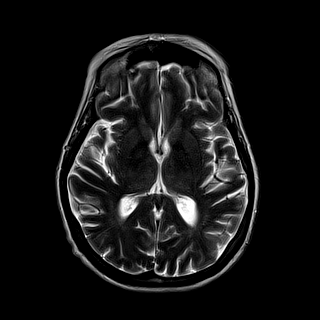
[im 23/23]
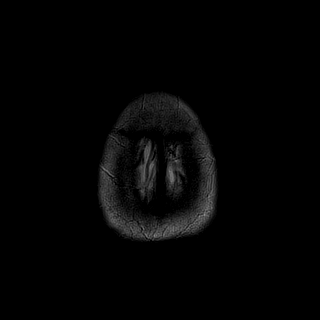

[Series 6: FLAIR · axial · 5.0mm · 0.94mm/px · z∈[-72,+71]mm · 3 of 23 slices shown]
[im 1/23]
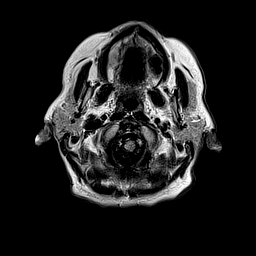
[im 12/23]
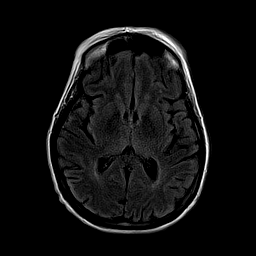
[im 23/23]
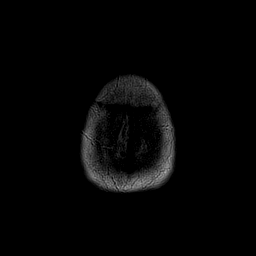

[Series 7: T1 · axial · 2.0mm · 0.47mm/px · z∈[-86,+102]mm · 9 of 95 slices shown]
[im 1/95]
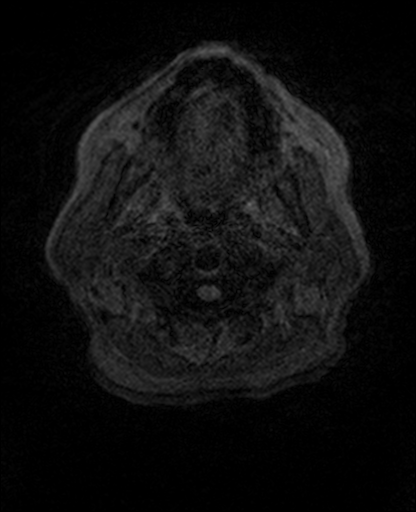
[im 16/95]
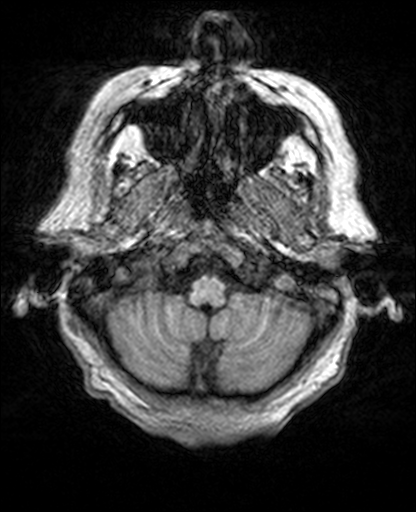
[im 32/95]
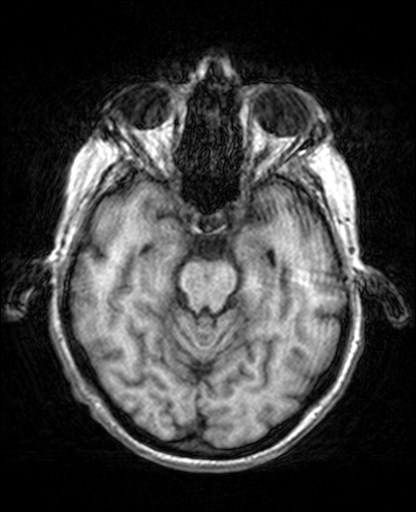
[im 40/95]
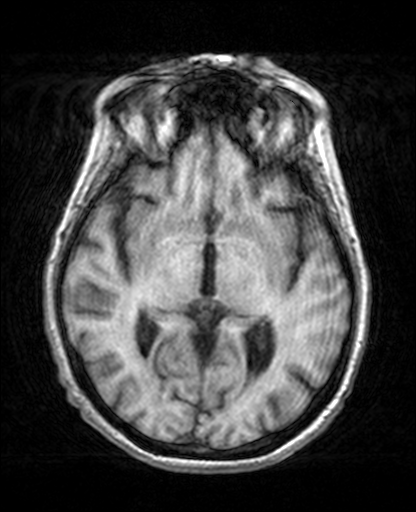
[im 48/95]
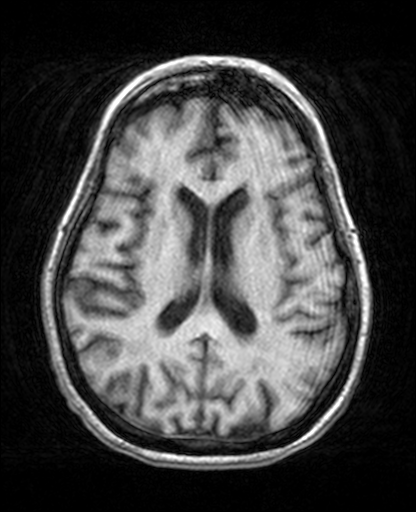
[im 55/95]
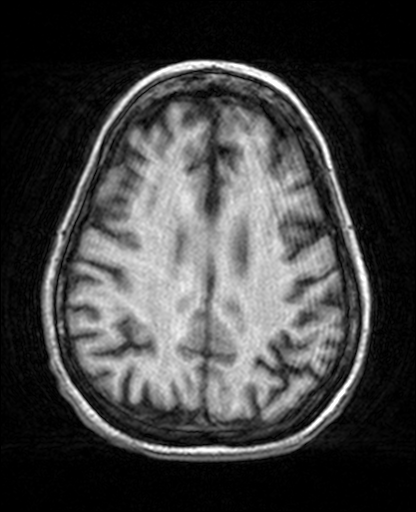
[im 63/95]
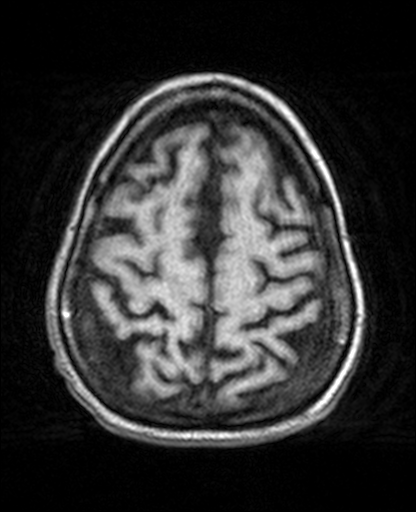
[im 79/95]
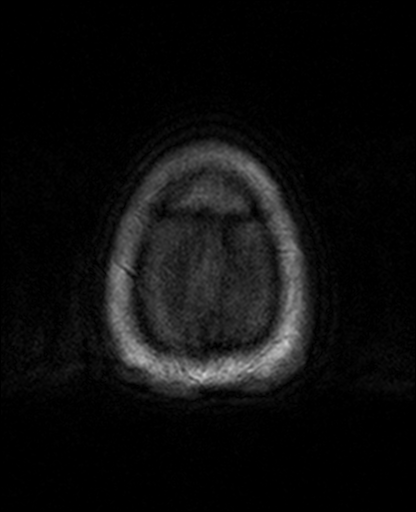
[im 95/95]
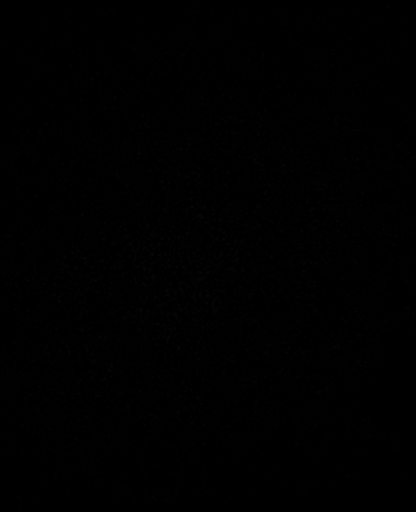

[Series 8: trauma axial · axial · 5.0mm · 0.45mm/px · z∈[-66,+64]mm · 3 of 21 slices shown]
[im 1/21]
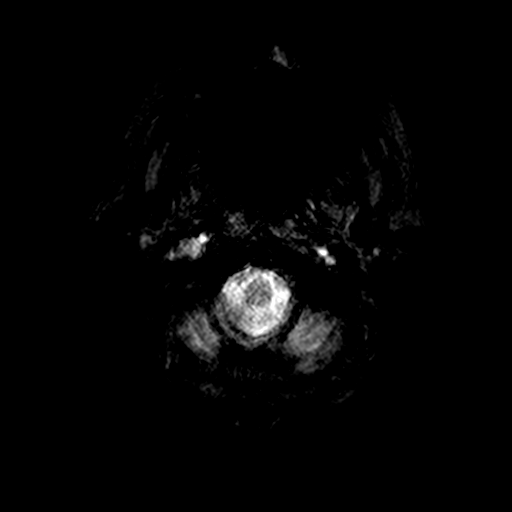
[im 11/21]
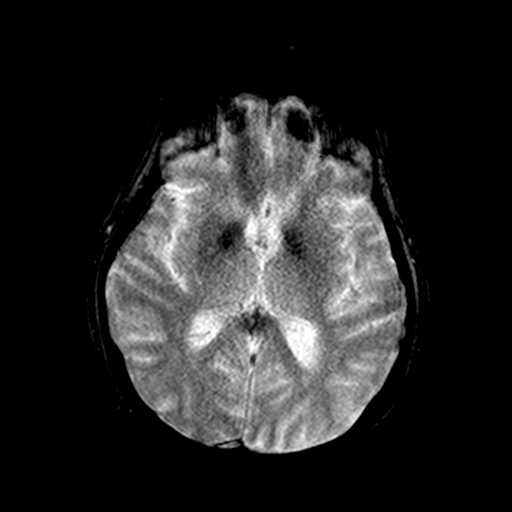
[im 21/21]
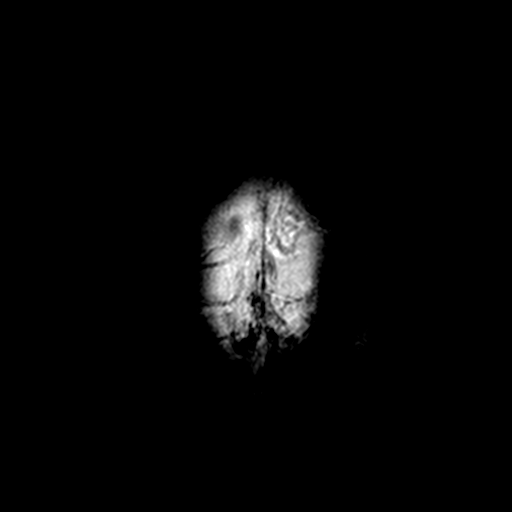

[Series 9: T2 · coronal · 5.0mm · 0.61mm/px · 4 of 28 slices shown (2 of 2)]
[im 1/28]
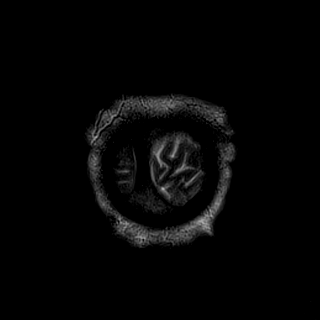
[im 10/28]
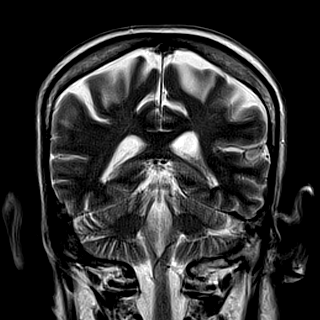
[im 19/28]
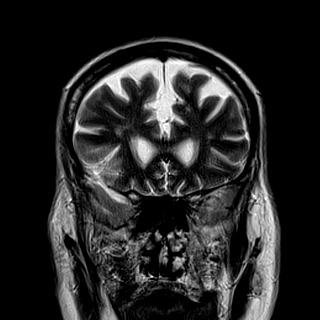
[im 28/28]
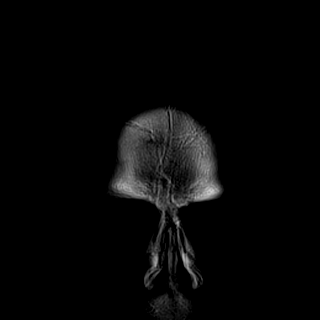

[Series 100: <mpr thick range> · axial · 5.0mm · 0.82mm/px · z∈[-71,+66]mm · 4 of 29 slices shown]
[im 1/29]
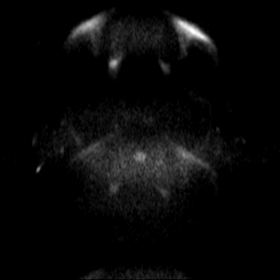
[im 10/29]
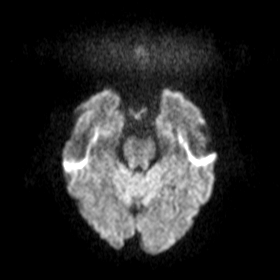
[im 19/29]
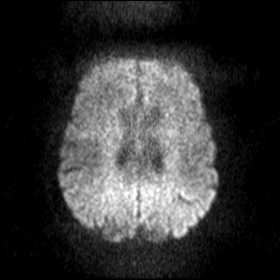
[im 29/29]
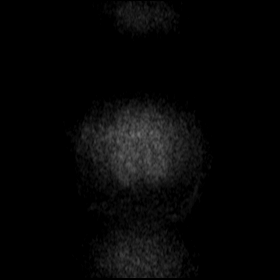

[30 of 48 positions shown; findings below may reference images not displayed]

FINDINGS: Study is intermittently degraded by motion artifact despite repeated
imaging attempts, due to patient uncontrollable tremor.

Cerebral volume remains within normal limits. Major intracranial
vascular flow voids are stable. No restricted diffusion to suggest
acute infarction. No midline shift, mass effect, evidence of mass
lesion, ventriculomegaly, extra-axial collection or acute
intracranial hemorrhage. Cervicomedullary junction and pituitary are
within normal limits. Negative visualized cervical spine. Minimal to
mild for age T2 and FLAIR hyperintensity in the cerebral white
matter has not progressed. Elsewhere gray and white matter signal
are within normal limits.

Visible internal auditory structures appear normal. Stable trace
mastoid fluid. Negative visualized nasopharynx. Mild paranasal sinus
mucosal thickening not significantly changed. Stable orbit soft
tissues. Visualized scalp soft tissues are within normal limits. No
marrow edema or evidence of acute osseous abnormality. To
IMPRESSION: No acute intracranial abnormality. Stable and largely unremarkable
for age non contrast MRI appearance of the brain.

## 2015-05-23 IMAGING — CR DG SACRUM/COCCYX 2+V
3 series · 3 of 3 positions shown · non-contrast
Comparison: Coronal and sagittal images through the lumbar spine
and sacrum and coccyx from an abdominal CT scan October 30, 2013

CLINICAL DATA: Status post fall striking the buttock region 2
months ago with persistent coccygeal pain

EXAM:
SACRUM AND COCCYX - 2+ VIEW

[view not recorded (1 of 3)]
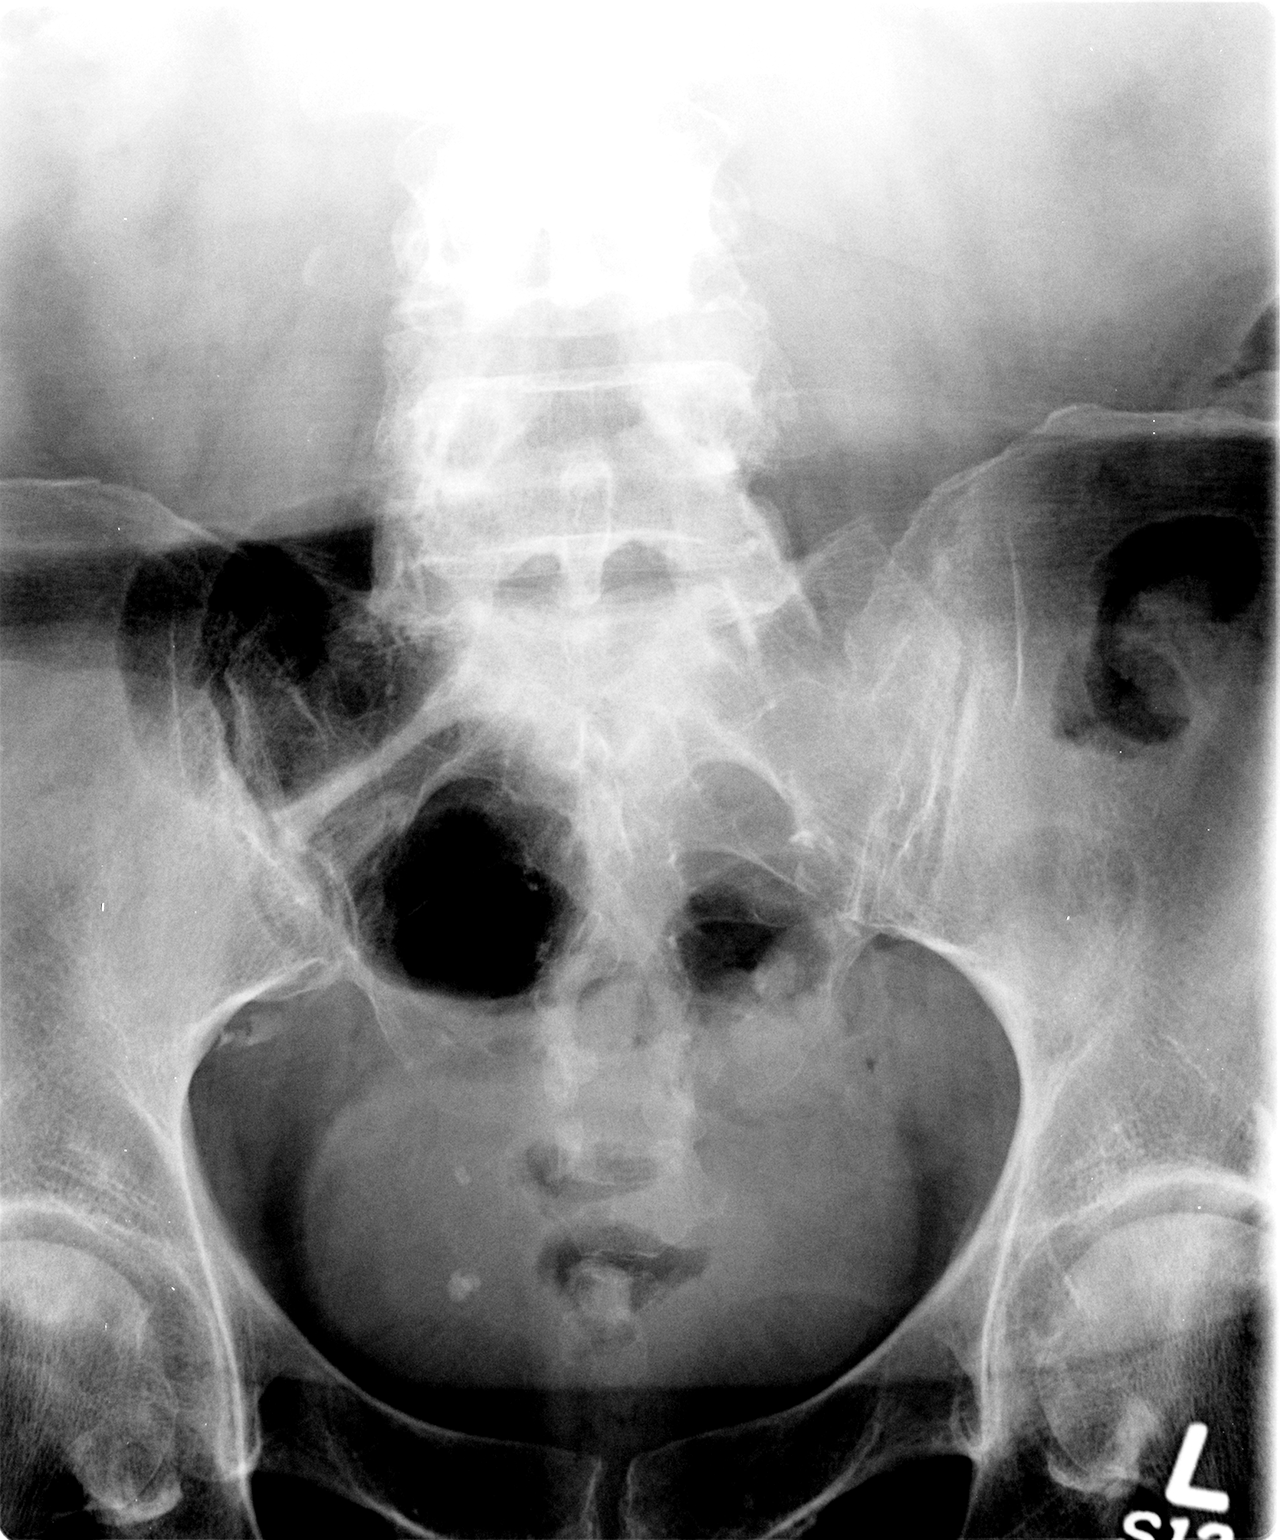

[view not recorded (2 of 3)]
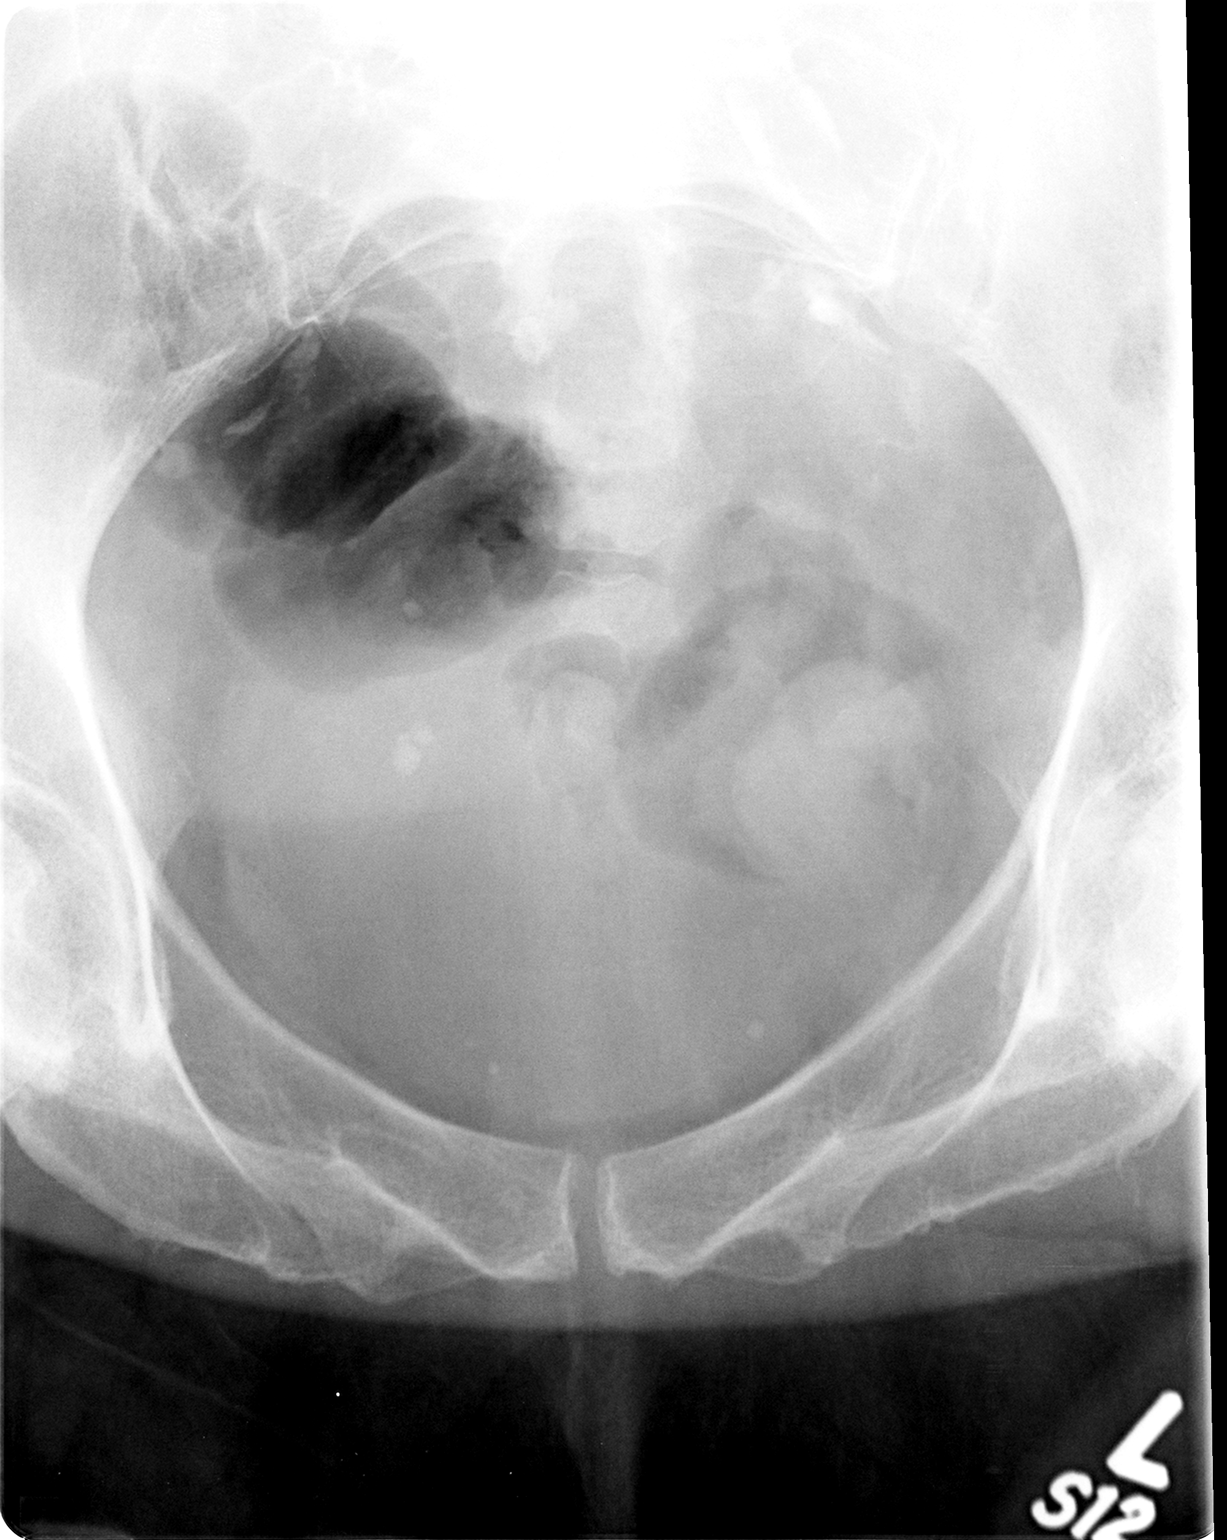

[view not recorded (3 of 3)]
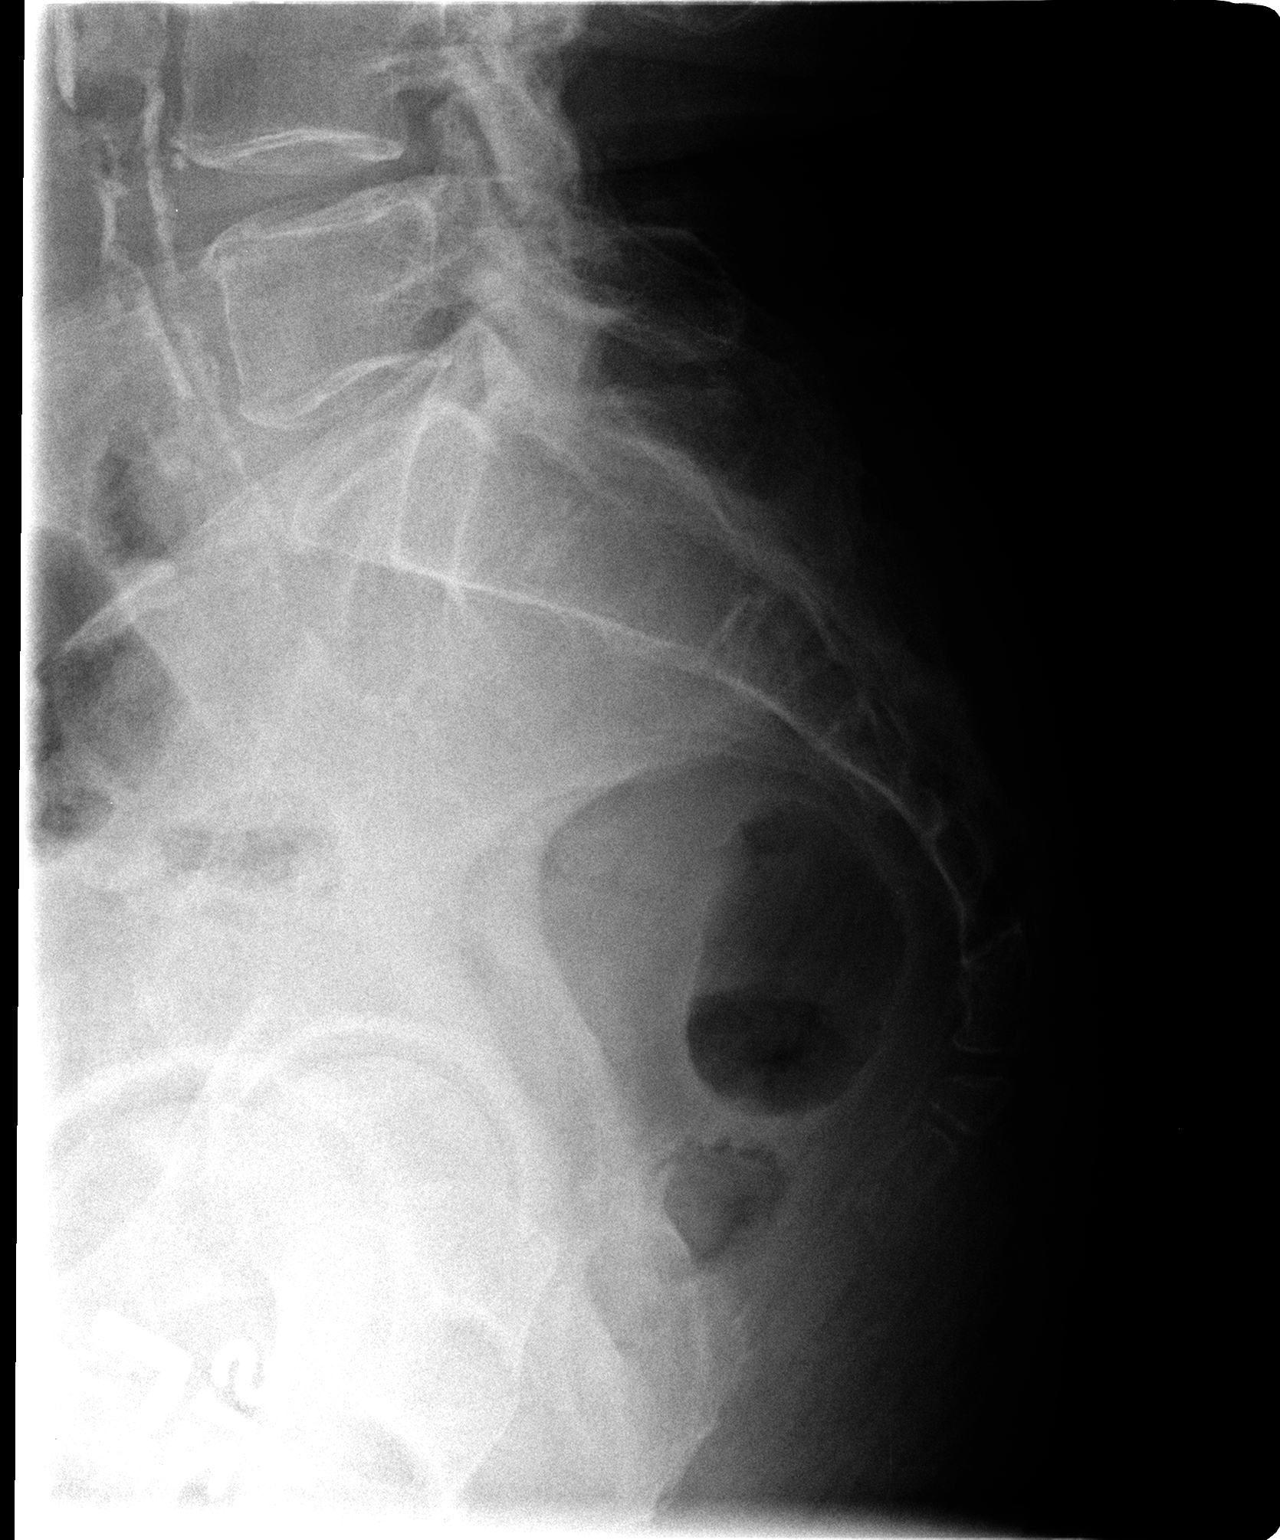

[3 of 3 positions shown; findings below may reference images not displayed]

FINDINGS: The study is limited due to overlying bowel gas and stool. The
sacrum is grossly intact. The SI joints are unremarkable. The
sacrococcygeal junction unremarkable and no acute abnormality mucous
coccyx is demonstrated..
IMPRESSION: No acute or significant chronic bony abnormality of the sacrum or
coccyx is demonstrated.

## 2015-05-23 IMAGING — CR DG LUMBAR SPINE COMPLETE 4+V
5 series · 5 of 5 positions shown · non-contrast
Comparison: Coronal and sagittal reconstructed images through the
lumbar spine from an abdominal pelvic CT scan October 30, 2013

CLINICAL DATA: Status post fall 2 months ago striking the buttock
region

EXAM:
LUMBAR SPINE - COMPLETE 4+ VIEW

[view not recorded (1 of 5)]
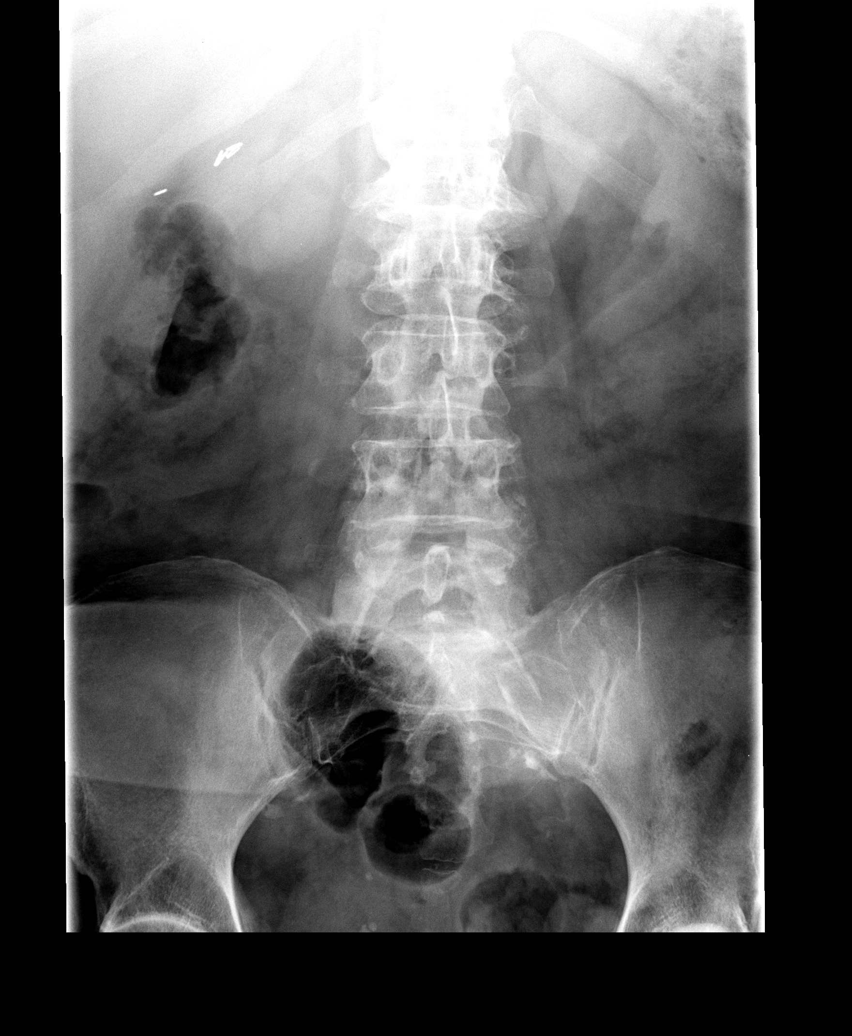

[view not recorded (2 of 5)]
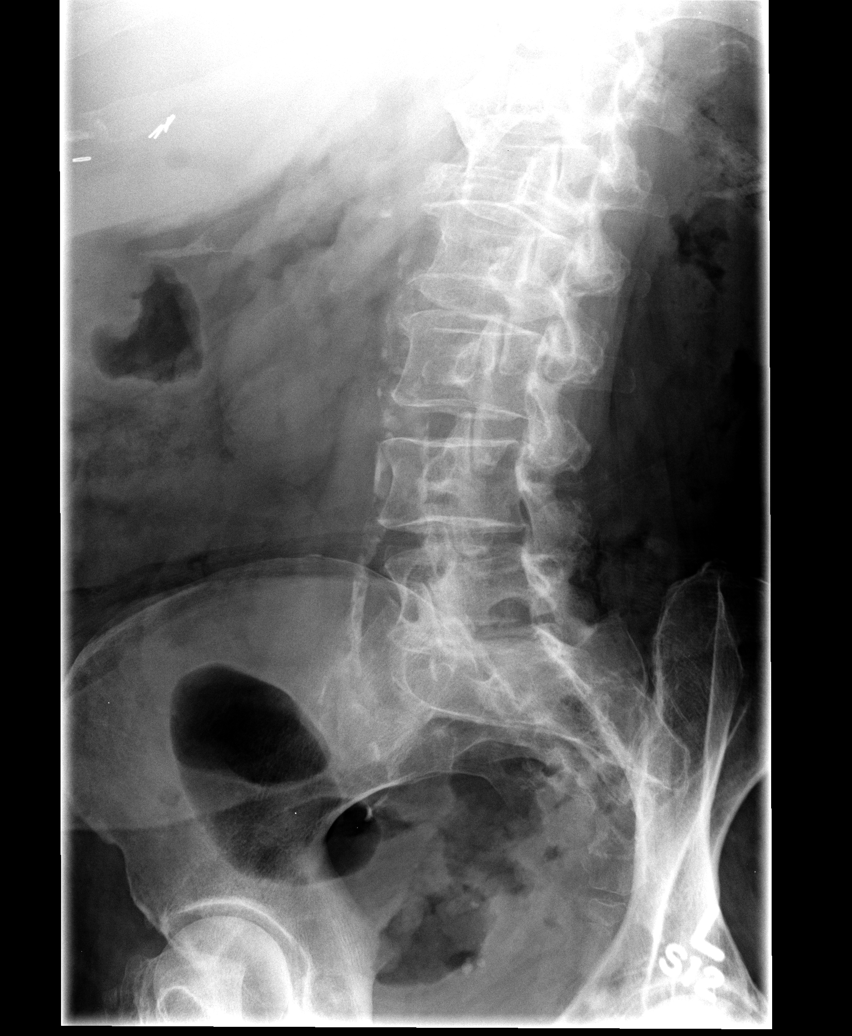

[view not recorded (3 of 5)]
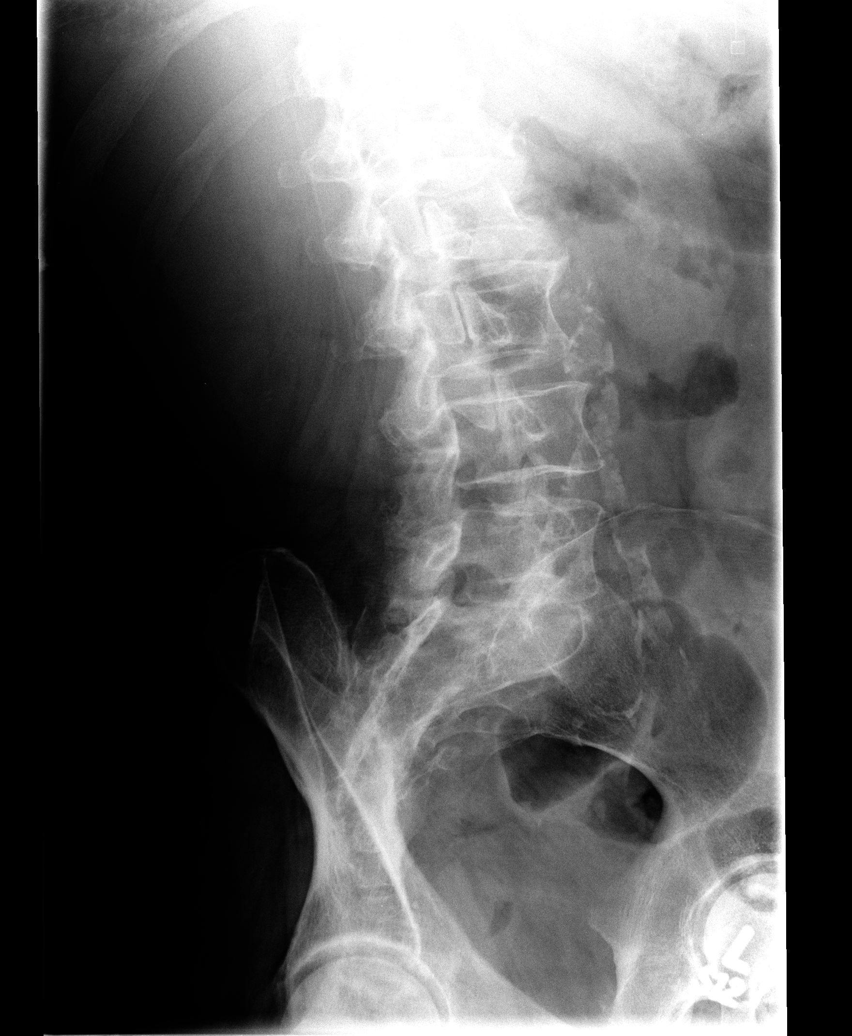

[view not recorded (4 of 5)]
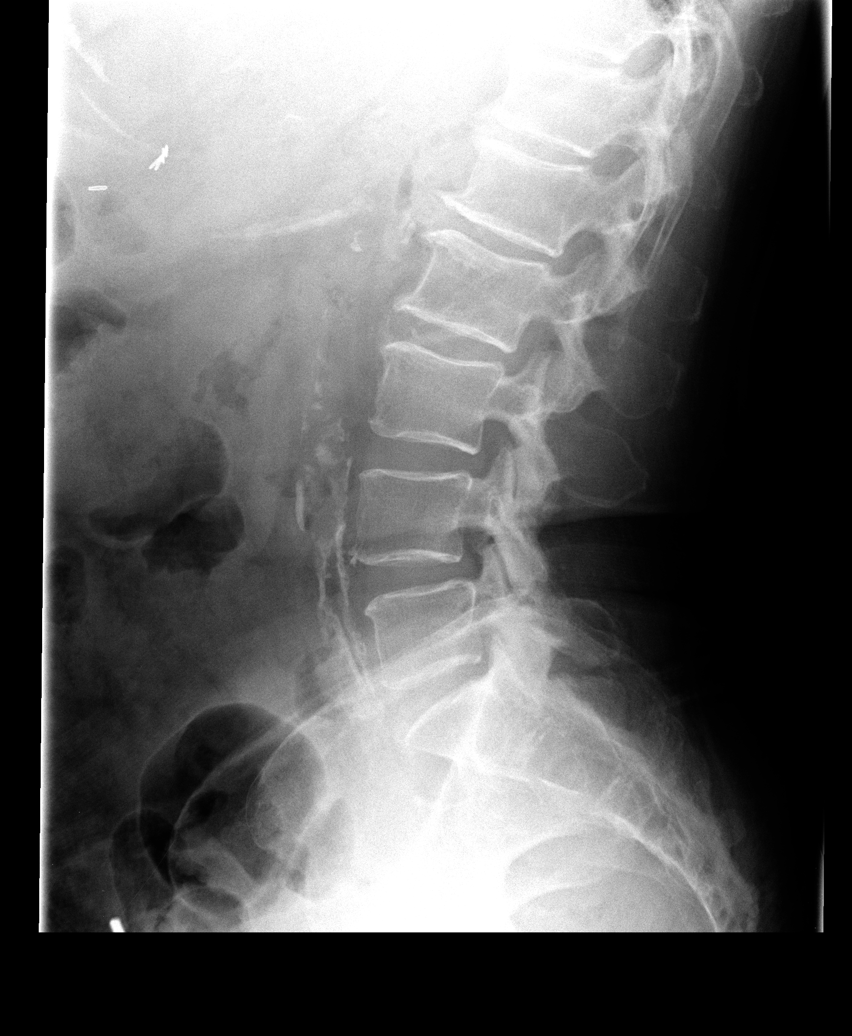

[view not recorded (5 of 5)]
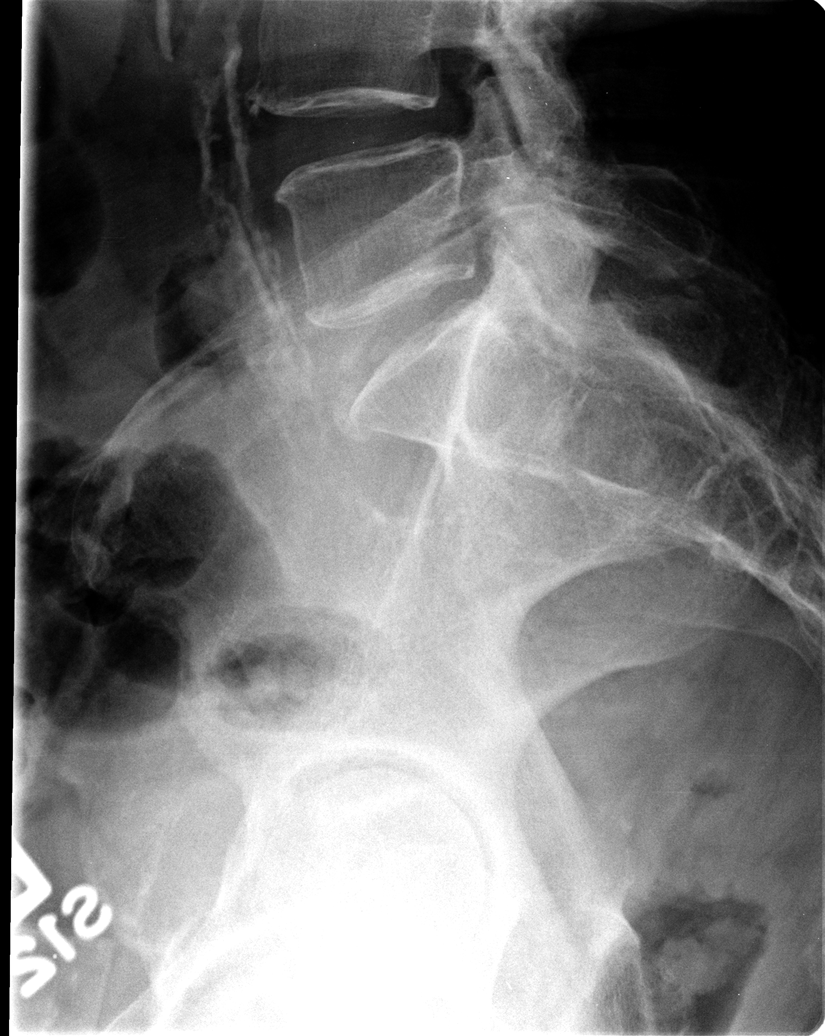

[5 of 5 positions shown; findings below may reference images not displayed]

FINDINGS: The lumbar vertebral bodies are preserved in height. There endplate
spurs noted at L1-2 and L2-3. There is no spondylolisthesis. The
disc space heights are well maintained. There is mild facet joint
hypertrophy at L5-S1. The pedicles and transverse processes are
intact. The observed portions of the sacrum are unremarkable.
IMPRESSION: There is mild degenerative change of the upper lumbar spine. There
is no compression fracture nor evidence of an acute or chronic
lumbar spine fracture.

## 2015-06-08 ENCOUNTER — Other Ambulatory Visit (HOSPITAL_COMMUNITY): Payer: Self-pay | Admitting: Psychiatry

## 2015-06-23 ENCOUNTER — Telehealth (HOSPITAL_COMMUNITY): Payer: Self-pay | Admitting: *Deleted

## 2015-06-23 NOTE — Telephone Encounter (Signed)
phone call from [place] need refill on Alprazolam 2 mg.

## 2015-06-24 ENCOUNTER — Encounter (HOSPITAL_COMMUNITY): Payer: Self-pay | Admitting: Psychiatry

## 2015-06-24 ENCOUNTER — Ambulatory Visit (INDEPENDENT_AMBULATORY_CARE_PROVIDER_SITE_OTHER): Payer: Medicare HMO | Admitting: Psychiatry

## 2015-06-24 VITALS — BP 136/80 | HR 93 | Ht 66.5 in | Wt 141.0 lb

## 2015-06-24 DIAGNOSIS — F39 Unspecified mood [affective] disorder: Secondary | ICD-10-CM

## 2015-06-24 DIAGNOSIS — F329 Major depressive disorder, single episode, unspecified: Secondary | ICD-10-CM

## 2015-06-24 DIAGNOSIS — F32A Depression, unspecified: Secondary | ICD-10-CM

## 2015-06-24 MED ORDER — ZOLPIDEM TARTRATE ER 12.5 MG PO TBCR
12.5000 mg | EXTENDED_RELEASE_TABLET | Freq: Every evening | ORAL | Status: DC | PRN
Start: 2015-06-24 — End: 2015-07-26
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
## 2015-06-24 NOTE — Telephone Encounter (Signed)
Pt was seen today.

## 2015-06-29 ENCOUNTER — Telehealth (HOSPITAL_COMMUNITY): Payer: Self-pay | Admitting: *Deleted

## 2015-06-29 NOTE — Telephone Encounter (Signed)
"  solarize" is not the name of any medication. Please find out correct name

## 2015-06-29 NOTE — Telephone Encounter (Signed)
Pt called stating that her Ambien is not working. Per pt she is averaging 2 hr since she started taking medication. Per pt, her frined is taking Solarize and would like to see if that would work for her. Pt number [phone]

## 2015-06-30 ENCOUNTER — Other Ambulatory Visit (HOSPITAL_COMMUNITY): Payer: Self-pay | Admitting: Psychiatry

## 2015-06-30 MED ORDER — TRAZODONE HCL 100 MG PO TABS: 100.0000 mg | ORAL_TABLET | Freq: Every day | ORAL | Status: DC

## 2015-06-30 NOTE — Telephone Encounter (Signed)
Per pt, the correct name is Soliris. Pt number is [phone].

## 2015-06-30 NOTE — Telephone Encounter (Signed)
That medicine is an immunosuppresant , not for sleep. I have sent in Trazodone

## 2015-07-06 ENCOUNTER — Telehealth (HOSPITAL_COMMUNITY): Payer: Self-pay | Admitting: *Deleted

## 2015-07-06 NOTE — Telephone Encounter (Signed)
Pt walked into office at 1:00pm asking why Dr. Harrington Challenger did not fill her "solarize" medication. Pulled pt back to the back room from the lobby. Informed pt again that the medication she is requesting Dr. Harrington Challenger stated that is for immunosuppresant not for sleep. Pt than stated that both of her friends that are taking this medication, they are sleeping well. Informed pt again what Dr. Harrington Challenger stated and informed her that Dr. Harrington Challenger had sent in Trazodone to her Pharmacy. Per pt, that medication don't work. Pt then stated to tell Dr. Harrington Challenger to give her Klonopin then because in the past that helped her sleep. Per pt, she will stay in office until medication is printed. Informed pt that message was sent to provider and pt went back to the lobby to wait. Informed provider of what was going on and per provider to have pt make an appt so they can discuss medication changes and t agreed and wrote pt appt down for her and she took it and left.

## 2015-07-06 NOTE — Telephone Encounter (Signed)
Patient came in regarding (per patient [name]) she need to pick up a prescription.

## 2015-07-06 NOTE — Telephone Encounter (Signed)
Pt is aware.  

## 2015-07-06 NOTE — Telephone Encounter (Signed)
Informed pt of what Dr. Harrington Challenger stated about the medication she request in previous call. Per pt, she tried taking Trazodone and it does not work for her. Per pt she would like to go back on Klonopin. Pt number is [phone]

## 2015-07-14 ENCOUNTER — Ambulatory Visit (HOSPITAL_COMMUNITY): Payer: Self-pay | Admitting: Psychiatry

## 2015-07-18 ENCOUNTER — Other Ambulatory Visit (HOSPITAL_COMMUNITY): Payer: Self-pay | Admitting: Psychiatry

## 2015-07-20 ENCOUNTER — Ambulatory Visit (HOSPITAL_COMMUNITY): Payer: Self-pay | Admitting: Psychiatry

## 2015-07-22 ENCOUNTER — Telehealth (HOSPITAL_COMMUNITY): Payer: Self-pay | Admitting: *Deleted

## 2015-07-22 NOTE — Telephone Encounter (Signed)
Please route to Octavia to call her and get more information

## 2015-07-22 NOTE — Telephone Encounter (Addendum)
Per pt she have the shakes really back and she need her medications refilled. Pt then stated that she can not sleep and need something for sleeping. Per pt, Dr. Cindie Laroche took her off of one of her medication that Dr. Harrington Challenger gived her and she would like to get that refilled. Asked pt for the name and she did not know what the name is. All pt kept saying was the medication that Dr. Harrington Challenger put her on for shaking and was very aggressive about it. Asked pt if she could come into office to discuss this and for a f/u and pt denied wanting to come into office for f/u. Per pt, why do I need to come into office for the provider to fill my medication, she always fill my medications just have her call in my medication to the pharmacy. Just tell Dr. Harrington Challenger that she can not sleep and need something for sleeping. While looking for medication pt is talking about for her sleep, pt then stated, the Trazodone that Dr. Harrington Challenger put me on do not work. Office then tried to inform her that she need to come into office for a f/u to discuss this with Dr. Harrington Challenger. Put pt on hold and informed provider and she stated to have pt come into office for f/u.../or Informed pt and she agreed to come to f/u appt.

## 2015-07-22 NOTE — Telephone Encounter (Signed)
phone call from patient she is shaking terrible, she was given medicine for shakes, she don't know the name of it, but her PC threw them away.   She said he told her she don't need them.

## 2015-07-25 NOTE — Telephone Encounter (Signed)
Pt made appt to come into office for f/u appt and to discuss medications

## 2015-07-26 ENCOUNTER — Ambulatory Visit (INDEPENDENT_AMBULATORY_CARE_PROVIDER_SITE_OTHER): Payer: Medicare HMO | Admitting: Psychiatry

## 2015-07-26 ENCOUNTER — Encounter (HOSPITAL_COMMUNITY): Payer: Self-pay | Admitting: Psychiatry

## 2015-07-26 VITALS — BP 169/79 | HR 78 | Ht 66.5 in | Wt 138.8 lb

## 2015-07-26 DIAGNOSIS — F32A Depression, unspecified: Secondary | ICD-10-CM

## 2015-07-26 DIAGNOSIS — F39 Unspecified mood [affective] disorder: Secondary | ICD-10-CM

## 2015-07-26 DIAGNOSIS — F329 Major depressive disorder, single episode, unspecified: Secondary | ICD-10-CM

## 2015-07-26 MED ORDER — DULOXETINE HCL 30 MG PO CPEP: 30.0000 mg | ORAL_CAPSULE | Freq: Two times a day (BID) | ORAL | Status: AC

## 2015-07-26 MED ORDER — QUETIAPINE FUMARATE 25 MG PO TABS: 25.0000 mg | ORAL_TABLET | Freq: Every day | ORAL | Status: DC
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
## 2015-08-10 ENCOUNTER — Ambulatory Visit (HOSPITAL_COMMUNITY): Payer: Self-pay | Admitting: Psychiatry

## 2015-08-22 ENCOUNTER — Ambulatory Visit (HOSPITAL_COMMUNITY)
Admission: RE | Admit: 2015-08-22 | Discharge: 2015-08-22 | Disposition: A | Discharge status: Home / Self Care | Payer: Medicare HMO | Source: Ambulatory Visit | Attending: Family Medicine | Admitting: Family Medicine

## 2015-08-22 ENCOUNTER — Other Ambulatory Visit (HOSPITAL_COMMUNITY): Payer: Self-pay | Admitting: Family Medicine

## 2015-08-22 DIAGNOSIS — J449 Chronic obstructive pulmonary disease, unspecified: Secondary | ICD-10-CM | POA: Diagnosis not present

## 2015-08-22 DIAGNOSIS — R0781 Pleurodynia: Secondary | ICD-10-CM

## 2015-08-22 DIAGNOSIS — R079 Chest pain, unspecified: Secondary | ICD-10-CM | POA: Insufficient documentation

## 2015-08-22 DIAGNOSIS — R05 Cough: Secondary | ICD-10-CM | POA: Insufficient documentation

## 2015-08-22 DIAGNOSIS — F17200 Nicotine dependence, unspecified, uncomplicated: Secondary | ICD-10-CM | POA: Insufficient documentation

## 2015-09-20 ENCOUNTER — Emergency Department (HOSPITAL_COMMUNITY)
Admission: EM | Admit: 2015-09-20 | Discharge: 2015-09-20 | Disposition: A | Discharge status: Home / Self Care | Payer: Medicare HMO | Attending: Emergency Medicine | Admitting: Emergency Medicine

## 2015-09-20 ENCOUNTER — Encounter (HOSPITAL_COMMUNITY): Payer: Self-pay | Admitting: *Deleted

## 2015-09-20 DIAGNOSIS — Z8739 Personal history of other diseases of the musculoskeletal system and connective tissue: Secondary | ICD-10-CM | POA: Diagnosis not present

## 2015-09-20 DIAGNOSIS — I2510 Atherosclerotic heart disease of native coronary artery without angina pectoris: Secondary | ICD-10-CM | POA: Diagnosis not present

## 2015-09-20 DIAGNOSIS — K219 Gastro-esophageal reflux disease without esophagitis: Secondary | ICD-10-CM | POA: Insufficient documentation

## 2015-09-20 DIAGNOSIS — Z8639 Personal history of other endocrine, nutritional and metabolic disease: Secondary | ICD-10-CM | POA: Diagnosis not present

## 2015-09-20 DIAGNOSIS — F329 Major depressive disorder, single episode, unspecified: Secondary | ICD-10-CM | POA: Insufficient documentation

## 2015-09-20 DIAGNOSIS — Z91040 Latex allergy status: Secondary | ICD-10-CM | POA: Insufficient documentation

## 2015-09-20 DIAGNOSIS — E785 Hyperlipidemia, unspecified: Secondary | ICD-10-CM | POA: Insufficient documentation

## 2015-09-20 DIAGNOSIS — F17210 Nicotine dependence, cigarettes, uncomplicated: Secondary | ICD-10-CM | POA: Diagnosis not present

## 2015-09-20 DIAGNOSIS — R519 Headache, unspecified: Secondary | ICD-10-CM

## 2015-09-20 DIAGNOSIS — Z79899 Other long term (current) drug therapy: Secondary | ICD-10-CM | POA: Diagnosis not present

## 2015-09-20 DIAGNOSIS — H53149 Visual discomfort, unspecified: Secondary | ICD-10-CM | POA: Insufficient documentation

## 2015-09-20 DIAGNOSIS — E119 Type 2 diabetes mellitus without complications: Secondary | ICD-10-CM | POA: Insufficient documentation

## 2015-09-20 DIAGNOSIS — Z86718 Personal history of other venous thrombosis and embolism: Secondary | ICD-10-CM | POA: Insufficient documentation

## 2015-09-20 DIAGNOSIS — R51 Headache: Secondary | ICD-10-CM | POA: Insufficient documentation

## 2015-09-20 DIAGNOSIS — Z880 Allergy status to penicillin: Secondary | ICD-10-CM | POA: Diagnosis not present

## 2015-09-20 DIAGNOSIS — E039 Hypothyroidism, unspecified: Secondary | ICD-10-CM | POA: Diagnosis not present

## 2015-09-20 DIAGNOSIS — J449 Chronic obstructive pulmonary disease, unspecified: Secondary | ICD-10-CM | POA: Insufficient documentation

## 2015-09-20 DIAGNOSIS — Z87442 Personal history of urinary calculi: Secondary | ICD-10-CM | POA: Diagnosis not present

## 2015-09-20 LAB — URINE MICROSCOPIC-ADD ON

## 2015-09-20 LAB — URINALYSIS, ROUTINE W REFLEX MICROSCOPIC
BILIRUBIN URINE: NEGATIVE
Glucose, UA: NEGATIVE mg/dL
KETONES UR: NEGATIVE mg/dL
Leukocytes, UA: NEGATIVE
NITRITE: NEGATIVE
SPECIFIC GRAVITY, URINE: 1.025 (ref 1.005–1.030)
pH: 5.5 (ref 5.0–8.0)

## 2015-09-20 MED ORDER — DIPHENHYDRAMINE HCL 50 MG/ML IJ SOLN
25.0000 mg | Freq: Once | INTRAMUSCULAR | Status: AC
  Administered 2015-09-20: 25 mg via INTRAVENOUS
  Filled 2015-09-20: qty 1

## 2015-09-20 MED ORDER — PROMETHAZINE HCL 25 MG PO TABS: 25.0000 mg | ORAL_TABLET | Freq: Four times a day (QID) | ORAL | Status: DC | PRN

## 2015-09-20 MED ORDER — OXYCODONE-ACETAMINOPHEN 5-325 MG PO TABS: 1.0000 | ORAL_TABLET | ORAL | Status: DC | PRN

## 2015-09-20 MED ORDER — METOCLOPRAMIDE HCL 5 MG/ML IJ SOLN
10.0000 mg | Freq: Once | INTRAMUSCULAR | Status: AC
  Administered 2015-09-20: 10 mg via INTRAVENOUS
  Filled 2015-09-20: qty 2

## 2015-09-20 MED ORDER — SODIUM CHLORIDE 0.9 % IV BOLUS (SEPSIS)
500.0000 mL | Freq: Once | INTRAVENOUS | Status: AC
  Administered 2015-09-20: 500 mL via INTRAVENOUS

## 2015-09-20 MED ORDER — KETOROLAC TROMETHAMINE 30 MG/ML IJ SOLN
30.0000 mg | Freq: Once | INTRAMUSCULAR | Status: AC
  Administered 2015-09-20: 30 mg via INTRAVENOUS
  Filled 2015-09-20: qty 1
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
## 2015-09-20 NOTE — ED Notes (Signed)
Pt states she is having a migraine, states she hasn't had a migraine in several years. Pt took Excedrin around 0900 this morning. Denies any change in pain

## 2015-09-20 NOTE — Discharge Instructions (Signed)
Meds for pain and nausea.  Urinalysis shows no infection

## 2015-09-20 NOTE — ED Notes (Signed)
Pt c/o migraine headache that started at 0730 this morning, no relief from 8 Excedrin that she has taken today. Pt reports light and sound sensitivity. Pt also c/o urinary frequency and 3-4 loose stools daily that started 1 year ago. Denies any other urinary or bowel symptoms. Pt also c/o "knot" on her "tailbone".

## 2015-09-20 NOTE — ED Provider Notes (Signed)
CSN: [id number]     Arrival date & time 09/20/15  1318 History   First MD Initiated Contact with Patient 09/20/15 1336     Chief Complaint  Patient presents with  . Headache     (Consider location/radiation/quality/duration/timing/severity/associated sxs/prior Treatment) HPI..... Right forehead headache for several days. Patient blames it on family stress. She has taken multiple Excedrin without relief. She is photophobic. Review systems positive for urinary frequency and loose stools. No neurological deficits, stiff neck. Severity symptoms moderate. She is depressed secondary to her relationship with her boyfriend  Past Medical History  Diagnosis Date  . Diabetes mellitus   . COPD (chronic obstructive pulmonary disease) ([place])   . ASCVD (arteriosclerotic cardiovascular disease)     MI in 96 requiring BMS CX; DES to M1 in 2000;normal coronary angiography in 2004  . Hyperlipidemia   . Hypothyroidism   . Tobacco abuse   . Depression   . DVT (deep venous thrombosis) ([place])   . Nephrolithiasis 2006    stone extraction   . Amputation of hand, right     traumatic  . Allergic rhinitis   . Cholelithiasis   . GERD (gastroesophageal reflux disease)   . Low back pain   . Peripheral neuropathy ([place])   . Diabetes mellitus type I ([place])   . DDD (degenerative disc disease), lumbar   . Sciatic pain     right  . Tremor    Past Surgical History  Procedure Laterality Date  . Dilation and curettage of uterus  1974  . Partial hysterectomy  1978  . Total abdominal hysterectomy w/ bilateral salpingoophorectomy  2002  . Shoulder surgery      Left shoulder for RTC;left arm surgery '98/left hand surgery 2001  . Cholecystectomy    . Umbilical hernia repair  2008  . Colonoscopy  01/2009    HYI:FOYDXA rectum/repeat in 5 yrs  . Back surgery    . Hand amputation Right     traumatic  . Esophagogastroduodenoscopy  05/2010    Dr. Tessie Fass, erosion. 51F dilation  . Colonoscopy N/A 04/29/2014     Dr.Rourk- attempted/incomplete colonoscopy. inadequate prep  . Esophagogastroduodenoscopy N/A 04/29/2014    Dr.Rourk- normal esophagus s/p passage of maloney dilator. small hiatal hernia- bx= chronic inflammation.  Venia Minks dilation N/A 04/29/2014    Procedure: Venia Minks DILATION;  Surgeon: Daneil Dolin, MD;  [facility];  Service: Endoscopy;  Laterality: N/A;  . Colonoscopy N/A 05/27/2014    JOI:NOMVEHMCN coli. Colonic polyps-removed as described above.Status post segmental biopsy   Family History  Problem Relation Age of Onset  . Depression Mother   . Bipolar disorder Mother   . Dementia Mother   . Alcohol abuse Father   . Aneurysm Father     deceased age 94, brain  . Pulmonary fibrosis Mother   . Colon cancer Paternal Grandfather     age greater than 63   Social History  Substance Use Topics  . Smoking status: Current Every Day Smoker -- 0.50 packs/day for 40 years    Types: Cigarettes  . Smokeless tobacco: Never Used     Comment: 1/2 pack per day  . Alcohol Use: No   OB History    Gravida Para Term Preterm AB TAB SAB Ectopic Multiple Living   3 2 2  1  1         Review of Systems  All other systems reviewed and are negative.     Allergies  Iohexol; Tape; Ciprofloxacin; Latex; Neomycin-bacitracin  zn-polymyx; Penicillins; and Povidone-iodine  Home Medications   Prior to Admission medications   Medication Sig Start Date End Date Taking? Authorizing Provider  citalopram (CELEXA) 20 MG tablet Take 1 tablet by mouth daily. 08/19/15  Yes Historical Provider, MD  clonazePAM (KLONOPIN) 0.5 MG tablet Take 1 tablet by mouth at bedtime. 09/19/15  Yes Historical Provider, MD  levothyroxine (SYNTHROID, LEVOTHROID) 200 MCG tablet Take 200 mcg by mouth daily before breakfast.   Yes Historical Provider, MD  lisinopril (PRINIVIL,ZESTRIL) 20 MG tablet Take 20 mg by mouth daily.     Yes Historical Provider, MD  omeprazole (PRILOSEC) 20 MG capsule Take 1 capsule by mouth  daily. 08/23/15  Yes Historical Provider, MD  OVER THE COUNTER MEDICATION Place 2 sprays into the nose 2 (two) times daily as needed (for congestion). 4 Way Nasal Spray   Yes Historical Provider, MD  pravastatin (PRAVACHOL) 40 MG tablet Take 40 mg by mouth daily.     Yes Historical Provider, MD  predniSONE (DELTASONE) 10 MG tablet Take 10-60 mg by mouth See admin instructions. Starting 09/19/2015 x 6 days. (6-5-4-3-2-1 tablet dosing regimen) 09/19/15  Yes Historical Provider, MD  QUEtiapine (SEROQUEL) 25 MG tablet Take 1 tablet (25 mg total) by mouth at bedtime. 07/26/15 07/25/16 Yes Cloria Spring, MD  traZODone (DESYREL) 100 MG tablet Take 1 tablet by mouth at bedtime. 08/19/15  Yes Historical Provider, MD  DULoxetine (CYMBALTA) 30 MG capsule Take 1 capsule (30 mg total) by mouth 2 (two) times daily. Patient not taking: Reported on 09/20/2015 07/26/15 07/25/16  Cloria Spring, MD  oxyCODONE-acetaminophen (PERCOCET/ROXICET) 5-325 MG tablet Take 1-2 tablets by mouth every 4 (four) hours as needed for severe pain. 09/20/15   Nat Christen, MD  promethazine (PHENERGAN) 25 MG tablet Take 1 tablet (25 mg total) by mouth every 6 (six) hours as needed. 09/20/15   Nat Christen, MD   BP 165/71 mmHg  Pulse 57  Temp(Src) 98.2 F (36.8 C) (Oral)  Resp 16  Ht 5' 6.5" (1.689 m)  Wt 148 lb (67.132 kg)  BMI 23.53 kg/m2  SpO2 97% Physical Exam  Constitutional: She is oriented to person, place, and time.  Minimal photophobia, no neuro deficits  HENT:  Head: Normocephalic and atraumatic.  Eyes: Conjunctivae and EOM are normal. Pupils are equal, round, and reactive to light.  Neck: Normal range of motion. Neck supple.  Cardiovascular: Normal rate and regular rhythm.   Pulmonary/Chest: Effort normal and breath sounds normal.  Abdominal: Soft. Bowel sounds are normal.  Musculoskeletal:  Right hand amputation  Neurological: She is alert and oriented to person, place, and time.  Skin: Skin is warm and dry.   Psychiatric: She has a normal mood and affect. Her behavior is normal.  Nursing note and vitals reviewed.   ED Course  Procedures (including critical care time) Labs Review Labs Reviewed  URINALYSIS, ROUTINE W REFLEX MICROSCOPIC (NOT AT [facility]) - Abnormal; Notable for the following:    Hgb urine dipstick TRACE (*)    Protein, ur >300 (*)    All other components within normal limits  URINE MICROSCOPIC-ADD ON - Abnormal; Notable for the following:    Squamous Epithelial / LPF 0-5 (*)    Bacteria, UA FEW (*)    Casts HYALINE CASTS (*)    All other components within normal limits    Imaging Review No results found. I have personally reviewed and evaluated these images and lab results as part of my medical decision-making.   EKG  Interpretation None      MDM   Final diagnoses:  Headache, unspecified headache type    Patient feels better after IV fluids, IV Toradol, Reglan, Benadryl. Urinalysis shows no evidence of infection. Discharge medications Percocet and Phenergan 25 mg    Nat Christen, MD 09/20/15 1651

## 2015-10-02 ENCOUNTER — Emergency Department (HOSPITAL_COMMUNITY)
Admission: EM | Admit: 2015-10-02 | Discharge: 2015-10-02 | Disposition: A | Discharge status: Home / Self Care | Payer: Medicare HMO | Attending: Emergency Medicine | Admitting: Emergency Medicine

## 2015-10-02 ENCOUNTER — Emergency Department (HOSPITAL_COMMUNITY): Payer: Medicare HMO

## 2015-10-02 ENCOUNTER — Encounter (HOSPITAL_COMMUNITY): Payer: Self-pay | Admitting: *Deleted

## 2015-10-02 DIAGNOSIS — K219 Gastro-esophageal reflux disease without esophagitis: Secondary | ICD-10-CM | POA: Diagnosis not present

## 2015-10-02 DIAGNOSIS — E039 Hypothyroidism, unspecified: Secondary | ICD-10-CM | POA: Diagnosis not present

## 2015-10-02 DIAGNOSIS — F329 Major depressive disorder, single episode, unspecified: Secondary | ICD-10-CM | POA: Diagnosis not present

## 2015-10-02 DIAGNOSIS — I2510 Atherosclerotic heart disease of native coronary artery without angina pectoris: Secondary | ICD-10-CM | POA: Diagnosis not present

## 2015-10-02 DIAGNOSIS — Z89111 Acquired absence of right hand: Secondary | ICD-10-CM | POA: Insufficient documentation

## 2015-10-02 DIAGNOSIS — Z86718 Personal history of other venous thrombosis and embolism: Secondary | ICD-10-CM | POA: Insufficient documentation

## 2015-10-02 DIAGNOSIS — E119 Type 2 diabetes mellitus without complications: Secondary | ICD-10-CM | POA: Insufficient documentation

## 2015-10-02 DIAGNOSIS — E785 Hyperlipidemia, unspecified: Secondary | ICD-10-CM | POA: Diagnosis not present

## 2015-10-02 DIAGNOSIS — Z91040 Latex allergy status: Secondary | ICD-10-CM | POA: Insufficient documentation

## 2015-10-02 DIAGNOSIS — R0602 Shortness of breath: Secondary | ICD-10-CM | POA: Diagnosis present

## 2015-10-02 DIAGNOSIS — J441 Chronic obstructive pulmonary disease with (acute) exacerbation: Secondary | ICD-10-CM | POA: Diagnosis not present

## 2015-10-02 DIAGNOSIS — Z79899 Other long term (current) drug therapy: Secondary | ICD-10-CM | POA: Insufficient documentation

## 2015-10-02 DIAGNOSIS — Z880 Allergy status to penicillin: Secondary | ICD-10-CM | POA: Insufficient documentation

## 2015-10-02 DIAGNOSIS — Z8739 Personal history of other diseases of the musculoskeletal system and connective tissue: Secondary | ICD-10-CM | POA: Insufficient documentation

## 2015-10-02 DIAGNOSIS — Z8669 Personal history of other diseases of the nervous system and sense organs: Secondary | ICD-10-CM | POA: Insufficient documentation

## 2015-10-02 DIAGNOSIS — F17210 Nicotine dependence, cigarettes, uncomplicated: Secondary | ICD-10-CM | POA: Insufficient documentation

## 2015-10-02 LAB — CBC WITH DIFFERENTIAL/PLATELET
BASOS PCT: 0 %
Basophils Absolute: 0 10*3/uL (ref 0.0–0.1)
EOS ABS: 0.1 10*3/uL (ref 0.0–0.7)
Eosinophils Relative: 1 %
HEMATOCRIT: 35.6 % — AB (ref 36.0–46.0)
HEMOGLOBIN: 11.8 g/dL — AB (ref 12.0–15.0)
LYMPHS ABS: 1.6 10*3/uL (ref 0.7–4.0)
Lymphocytes Relative: 19 %
MCH: 31 pg (ref 26.0–34.0)
MCHC: 33.1 g/dL (ref 30.0–36.0)
MCV: 93.4 fL (ref 78.0–100.0)
MONOS PCT: 6 %
Monocytes Absolute: 0.5 10*3/uL (ref 0.1–1.0)
NEUTROS ABS: 6.2 10*3/uL (ref 1.7–7.7)
NEUTROS PCT: 74 %
Platelets: 172 10*3/uL (ref 150–400)
RBC: 3.81 MIL/uL — AB (ref 3.87–5.11)
RDW: 14.5 % (ref 11.5–15.5)
WBC: 8.4 10*3/uL (ref 4.0–10.5)

## 2015-10-02 LAB — BASIC METABOLIC PANEL
Anion gap: 8 (ref 5–15)
BUN: 15 mg/dL (ref 6–20)
CHLORIDE: 103 mmol/L (ref 101–111)
CO2: 25 mmol/L (ref 22–32)
Calcium: 9 mg/dL (ref 8.9–10.3)
Creatinine, Ser: 1.19 mg/dL — ABNORMAL HIGH (ref 0.44–1.00)
GFR calc non Af Amer: 47 mL/min — ABNORMAL LOW (ref >60)
GFR, EST AFRICAN AMERICAN: 55 mL/min — AB (ref >60)
Glucose, Bld: 184 mg/dL — ABNORMAL HIGH (ref 65–99)
POTASSIUM: 3.9 mmol/L (ref 3.5–5.1)
SODIUM: 136 mmol/L (ref 135–145)

## 2015-10-02 LAB — TROPONIN I

## 2015-10-02 MED ORDER — PREDNISONE 20 MG PO TABS: 60.0000 mg | ORAL_TABLET | Freq: Every day | ORAL | Status: DC

## 2015-10-02 MED ORDER — ALBUTEROL SULFATE HFA 108 (90 BASE) MCG/ACT IN AERS: 2.0000 | INHALATION_SPRAY | RESPIRATORY_TRACT | Status: DC | PRN

## 2015-10-02 MED ORDER — ALBUTEROL SULFATE HFA 108 (90 BASE) MCG/ACT IN AERS
2.0000 | INHALATION_SPRAY | RESPIRATORY_TRACT | Status: DC | PRN
  Filled 2015-10-02: qty 6.7

## 2015-10-02 MED ORDER — IPRATROPIUM-ALBUTEROL 0.5-2.5 (3) MG/3ML IN SOLN
3.0000 mL | Freq: Once | RESPIRATORY_TRACT | Status: AC
  Administered 2015-10-02: 3 mL via RESPIRATORY_TRACT
  Filled 2015-10-02: qty 3

## 2015-10-02 NOTE — ED Notes (Signed)
Pt c/o sob, wheezing that started a week ago becoming worse tonight, pt received one 2.5 albuterol, one duoneb and 125 solumedrol while enroute with ems with improvement in symptoms,

## 2015-10-02 NOTE — Discharge Instructions (Signed)
Chronic Obstructive Pulmonary Disease Chronic obstructive pulmonary disease (COPD) is a common lung condition in which airflow from the lungs is limited. COPD is a general term that can be used to describe many different lung problems that limit airflow, including both chronic bronchitis and emphysema. If you have COPD, your lung function will probably never return to normal, but there are measures you can take to improve lung function and make yourself feel better. CAUSES   Smoking (common).  Exposure to secondhand smoke.  Genetic problems.  Chronic inflammatory lung diseases or recurrent infections. SYMPTOMS  Shortness of breath, especially with physical activity.  Deep, persistent (chronic) cough with a large amount of thick mucus.  Wheezing.  Rapid breaths (tachypnea).  Gray or bluish discoloration (cyanosis) of the skin, especially in your fingers, toes, or lips.  Fatigue.  Weight loss.  Frequent infections or episodes when breathing symptoms become much worse (exacerbations).  Chest tightness. DIAGNOSIS Your health care provider will take a medical history and perform a physical examination to diagnose COPD. Additional tests for COPD may include:  Lung (pulmonary) function tests.  Chest X-ray.  CT scan.  Blood tests. TREATMENT  Treatment for COPD may include:  Inhaler and nebulizer medicines. These help manage the symptoms of COPD and make your breathing more comfortable.  Supplemental oxygen. Supplemental oxygen is only helpful if you have a low oxygen level in your blood.  Exercise and physical activity. These are beneficial for nearly all people with COPD.  Lung surgery or transplant.  Nutrition therapy to gain weight, if you are underweight.  Pulmonary rehabilitation. This may involve working with a team of health care providers and specialists, such as respiratory, occupational, and physical therapists. HOME CARE INSTRUCTIONS  Take all medicines  (inhaled or pills) as directed by your health care provider.  Avoid over-the-counter medicines or cough syrups that dry up your airway (such as antihistamines) and slow down the elimination of secretions unless instructed otherwise by your health care provider.  If you are a smoker, the most important thing that you can do is stop smoking. Continuing to smoke will cause further lung damage and breathing trouble. Ask your health care provider for help with quitting smoking. He or she can direct you to community resources or hospitals that provide support.  Avoid exposure to irritants such as smoke, chemicals, and fumes that aggravate your breathing.  Use oxygen therapy and pulmonary rehabilitation if directed by your health care provider. If you require home oxygen therapy, ask your health care provider whether you should purchase a pulse oximeter to measure your oxygen level at home.  Avoid contact with individuals who have a contagious illness.  Avoid extreme temperature and humidity changes.  Eat healthy foods. Eating smaller, more frequent meals and resting before meals may help you maintain your strength.  Stay active, but balance activity with periods of rest. Exercise and physical activity will help you maintain your ability to do things you want to do.  Preventing infection and hospitalization is very important when you have COPD. Make sure to receive all the vaccines your health care provider recommends, especially the pneumococcal and influenza vaccines. Ask your health care provider whether you need a pneumonia vaccine.  Learn and use relaxation techniques to manage stress.  Learn and use controlled breathing techniques as directed by your health care provider. Controlled breathing techniques include:  Pursed lip breathing. Start by breathing in (inhaling) through your nose for 1 second. Then, purse your lips as if you were   going to whistle and breathe out (exhale) through the  pursed lips for 2 seconds.  Diaphragmatic breathing. Start by putting one hand on your abdomen just above your waist. Inhale slowly through your nose. The hand on your abdomen should move out. Then purse your lips and exhale slowly. You should be able to feel the hand on your abdomen moving in as you exhale.  Learn and use controlled coughing to clear mucus from your lungs. Controlled coughing is a series of short, progressive coughs. The steps of controlled coughing are: 1. Lean your head slightly forward. 2. Breathe in deeply using diaphragmatic breathing. 3. Try to hold your breath for 3 seconds. 4. Keep your mouth slightly open while coughing twice. 5. Spit any mucus out into a tissue. 6. Rest and repeat the steps once or twice as needed. SEEK MEDICAL CARE IF:  You are coughing up more mucus than usual.  There is a change in the color or thickness of your mucus.  Your breathing is more labored than usual.  Your breathing is faster than usual. SEEK IMMEDIATE MEDICAL CARE IF:  You have shortness of breath while you are resting.  You have shortness of breath that prevents you from:  Being able to talk.  Performing your usual physical activities.  You have chest pain lasting longer than 5 minutes.  Your skin color is more cyanotic than usual.  You measure low oxygen saturations for longer than 5 minutes with a pulse oximeter. MAKE SURE YOU:  Understand these instructions.  Will watch your condition.  Will get help right away if you are not doing well or get worse.   This information is not intended to replace advice given to you by your health care provider. Make sure you discuss any questions you have with your health care provider.   Document Released: 07/11/2005 Document Revised: 10/22/2014 Document Reviewed: 05/28/2013 Elsevier Interactive Patient Education 2016 [employer]  

## 2015-10-02 NOTE — ED Notes (Signed)
Pt ambulated to bathroom and around nurses station without feeling SOB or dizzy, O2 saturation remained above 92%.

## 2015-10-02 NOTE — ED Provider Notes (Signed)
CSN: [id number]     Arrival date & time 10/02/15  7654 History   First MD Initiated Contact with Patient 10/02/15 [phone]     Chief Complaint  Patient presents with  . Shortness of Breath     (Consider location/radiation/quality/duration/timing/severity/associated sxs/prior Treatment) HPI  This is a 64 year old female with history of diabetes, COPD, hyperlipidemia, tobacco use, DVT who presents with cough and shortness of breath. Patient reports one week history of increasing shortness of breath and wheezing as well as cough. She takes Symbicort at home but does not have a rescue inhaler. She states that she woke up tonight coughing and couldn't catch her breath. Denies any fevers. Denies any recent COPD exacerbations. Patient was given albuterol, DuoNeb, and Solu-Medrol by EMS with improvement. She states "my ribs hurts from all this coughing." She denies chest pain. She denies any pain at this time.  Past Medical History  Diagnosis Date  . Diabetes mellitus   . COPD (chronic obstructive pulmonary disease) ([place])   . ASCVD (arteriosclerotic cardiovascular disease)     MI in 96 requiring BMS CX; DES to M1 in 2000;normal coronary angiography in 2004  . Hyperlipidemia   . Hypothyroidism   . Tobacco abuse   . Depression   . DVT (deep venous thrombosis) ([place])   . Nephrolithiasis 2006    stone extraction   . Amputation of hand, right     traumatic  . Allergic rhinitis   . Cholelithiasis   . GERD (gastroesophageal reflux disease)   . Low back pain   . Peripheral neuropathy ([place])   . Diabetes mellitus type I ([place])   . DDD (degenerative disc disease), lumbar   . Sciatic pain     right  . Tremor    Past Surgical History  Procedure Laterality Date  . Dilation and curettage of uterus  1974  . Partial hysterectomy  1978  . Total abdominal hysterectomy w/ bilateral salpingoophorectomy  2002  . Shoulder surgery      Left shoulder for RTC;left arm surgery '98/left hand surgery 2001  .  Cholecystectomy    . Umbilical hernia repair  2008  . Colonoscopy  01/2009    TWS:FKCLEX rectum/repeat in 5 yrs  . Back surgery    . Hand amputation Right     traumatic  . Esophagogastroduodenoscopy  05/2010    Dr. Tessie Fass, erosion. 21F dilation  . Colonoscopy N/A 04/29/2014    Dr.Rourk- attempted/incomplete colonoscopy. inadequate prep  . Esophagogastroduodenoscopy N/A 04/29/2014    Dr.Rourk- normal esophagus s/p passage of maloney dilator. small hiatal hernia- bx= chronic inflammation.  Venia Minks dilation N/A 04/29/2014    Procedure: Venia Minks DILATION;  Surgeon: Daneil Dolin, MD;  Location: [facility];  Service: Endoscopy;  Laterality: N/A;  . Colonoscopy N/A 05/27/2014    NTZ:GYFVCBSWH coli. Colonic polyps-removed as described above.Status post segmental biopsy   Family History  Problem Relation Age of Onset  . Depression Mother   . Bipolar disorder Mother   . Dementia Mother   . Alcohol abuse Father   . Aneurysm Father     deceased age 59, brain  . Pulmonary fibrosis Mother   . Colon cancer Paternal Grandfather     age greater than 18   Social History  Substance Use Topics  . Smoking status: Current Every Day Smoker -- 0.50 packs/day for 40 years    Types: Cigarettes  . Smokeless tobacco: Never Used     Comment: 1/2 pack per day  . Alcohol Use:  No   OB History    Gravida Para Term Preterm AB TAB SAB Ectopic Multiple Living   3 2 2  1  1         Review of Systems  Constitutional: Negative for fever.  Respiratory: Positive for cough and shortness of breath. Negative for chest tightness.   Cardiovascular: Negative for chest pain and leg swelling.  Gastrointestinal: Negative for nausea and vomiting.  Genitourinary: Negative for dysuria.  Neurological: Negative for headaches.  All other systems reviewed and are negative.     Allergies  Iohexol; Tape; Ciprofloxacin; Latex; Neomycin-bacitracin zn-polymyx; Penicillins; and Povidone-iodine  Home Medications    Prior to Admission medications   Medication Sig Start Date End Date Taking? Authorizing Provider  albuterol (PROVENTIL HFA;VENTOLIN HFA) 108 (90 BASE) MCG/ACT inhaler Inhale 2 puffs into the lungs every 4 (four) hours as needed for wheezing or shortness of breath. 10/02/15   Merryl Hacker, MD  citalopram (CELEXA) 20 MG tablet Take 1 tablet by mouth daily. 08/19/15   Historical Provider, MD  clonazePAM (KLONOPIN) 0.5 MG tablet Take 1 tablet by mouth at bedtime. 09/19/15   Historical Provider, MD  DULoxetine (CYMBALTA) 30 MG capsule Take 1 capsule (30 mg total) by mouth 2 (two) times daily. Patient not taking: Reported on 09/20/2015 07/26/15 07/25/16  Cloria Spring, MD  levothyroxine (SYNTHROID, LEVOTHROID) 200 MCG tablet Take 200 mcg by mouth daily before breakfast.    Historical Provider, MD  lisinopril (PRINIVIL,ZESTRIL) 20 MG tablet Take 20 mg by mouth daily.      Historical Provider, MD  omeprazole (PRILOSEC) 20 MG capsule Take 1 capsule by mouth daily. 08/23/15   Historical Provider, MD  OVER THE COUNTER MEDICATION Place 2 sprays into the nose 2 (two) times daily as needed (for congestion). 4 Way Nasal Spray    Historical Provider, MD  oxyCODONE-acetaminophen (PERCOCET/ROXICET) 5-325 MG tablet Take 1-2 tablets by mouth every 4 (four) hours as needed for severe pain. 09/20/15   Nat Christen, MD  pravastatin (PRAVACHOL) 40 MG tablet Take 40 mg by mouth daily.      Historical Provider, MD  predniSONE (DELTASONE) 20 MG tablet Take 3 tablets (60 mg total) by mouth daily. 10/02/15   Merryl Hacker, MD  promethazine (PHENERGAN) 25 MG tablet Take 1 tablet (25 mg total) by mouth every 6 (six) hours as needed. 09/20/15   Nat Christen, MD  QUEtiapine (SEROQUEL) 25 MG tablet Take 1 tablet (25 mg total) by mouth at bedtime. 07/26/15 07/25/16  Cloria Spring, MD  traZODone (DESYREL) 100 MG tablet Take 1 tablet by mouth at bedtime. 08/19/15   Historical Provider, MD   BP 142/59 mmHg  Pulse 66  Temp(Src)  98.4 F (36.9 C) (Oral)  Resp 16  Ht 5' 6.5" (1.689 m)  Wt 148 lb (67.132 kg)  BMI 23.53 kg/m2  SpO2 91% Physical Exam  Constitutional: She is oriented to person, place, and time.  Chronically ill-appearing, no acute distress  HENT:  Head: Normocephalic and atraumatic.  Cardiovascular: Normal rate, regular rhythm and normal heart sounds.   No murmur heard. Pulmonary/Chest: Effort normal. No respiratory distress. She has wheezes.  Fair movement, diffuse expiratory wheeze  Abdominal: Soft. Bowel sounds are normal. There is no tenderness.  Musculoskeletal: She exhibits no edema.  Right hand amputation  Neurological: She is alert and oriented to person, place, and time.  Skin: Skin is warm and dry.  Psychiatric: She has a normal mood and affect.  Nursing note and vitals  reviewed.   ED Course  Procedures (including critical care time) Labs Review Labs Reviewed  CBC WITH DIFFERENTIAL/PLATELET - Abnormal; Notable for the following:    RBC 3.81 (*)    Hemoglobin 11.8 (*)    HCT 35.6 (*)    All other components within normal limits  BASIC METABOLIC PANEL - Abnormal; Notable for the following:    Glucose, Bld 184 (*)    Creatinine, Ser 1.19 (*)    GFR calc non Af Amer 47 (*)    GFR calc Af Amer 55 (*)    All other components within normal limits  TROPONIN I    Imaging Review Dg Chest Portable 1 View  10/02/2015  CLINICAL DATA:  Shortness of breath and wheezing started week ago but became worse tonight. EXAM: PORTABLE CHEST 1 VIEW COMPARISON:  08/22/2015 FINDINGS: Interstitial fibrosis or atelectasis in the lung bases. Normal heart size and pulmonary vascularity. No focal airspace disease or consolidation in the lungs. No blunting of costophrenic angles. No pneumothorax. Mediastinal contours appear intact. IMPRESSION: Fibrosis or atelectasis in the lung bases similar to previous study. No evidence of active pulmonary disease. Electronically Signed   By: Lucienne Capers M.D.   On:  10/02/2015 04:47   I have personally reviewed and evaluated these images and lab results as part of my medical decision-making.   EKG Interpretation   Date/Time:  Sunday October 02 2015 03:38:55 EST Ventricular Rate:  79 PR Interval:  138 QRS Duration: 82 QT Interval:  366 QTC Calculation: 419 R Axis:   12 Text Interpretation:  Sinus rhythm Borderline repolarization abnormality  Confirmed by Jaidan Stachnik  MD, [place] ([id number]) on 10/02/2015 3:42:00 AM      MDM   Final diagnoses:  COPD exacerbation ([place])    Patient presents with shortness of breath, cough, and wheezing. She was evaluated by EMS and given a duo neb and Solu-Medrol. She reports improvement upon arrival. She continues to wheeze but has good air movement. Chronically ill-appearing. No acute distress. O2 sats 94%. X-ray without evidence of pneumonia. Basic labwork is reassuring. Patient was given a second duo neb.  On recheck, she is sleeping comfortably. She continues to have scant expiratory wheezing. She states that she is out of her albuterol. Albuterol inhaler ordered. Patient was able to ambulate and maintain pulse ox greater than 92% without oxygen. Suspect acute exacerbation. Will discharge with inhaler and burst dose of steroids.  After history, exam, and medical workup I feel the patient has been appropriately medically screened and is safe for discharge home. Pertinent diagnoses were discussed with the patient. Patient was given return precautions.     Merryl Hacker, MD 10/02/15 [phone]
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
## 2015-12-27 ENCOUNTER — Telehealth (HOSPITAL_COMMUNITY): Payer: Self-pay | Admitting: *Deleted

## 2015-12-27 NOTE — Telephone Encounter (Signed)
Pt pharmacy requesting refills for pt Trazodone. Per pt chart, looks like pt medication was d/c on 07-26-15 and was put back on pt list on 08-19-15. Pt last appt with provider was 07-26-15 and do not have f/u scheduled. Pt pharmacy number is [phone]

## 2015-12-27 NOTE — Telephone Encounter (Signed)
i d/ced trazodone, she stated it wasn't working. She will need to come in

## 2015-12-28 NOTE — Telephone Encounter (Signed)
Called pt home number and lmtcb. Number provided

## 2015-12-28 NOTE — Telephone Encounter (Signed)
Called pt mobile number and mailbox is full

## 2015-12-29 NOTE — Telephone Encounter (Signed)
Called pt home number and lmtcb

## 2015-12-29 NOTE — Telephone Encounter (Signed)
Called pt mobile and lmtcb number provided

## 2016-01-06 ENCOUNTER — Telehealth (HOSPITAL_COMMUNITY): Payer: Self-pay | Admitting: *Deleted

## 2016-01-06 NOTE — Telephone Encounter (Signed)
Pt pharmacy requesting refills for pt Trazodone HCL 100 mg QHS. Pt medication last filled 08-19-2015. Called pt to sch f/u appt lmtcb due to pt last appt being 07-2015. Pt pharmacy requesting refills for pt Trazodone HCL 100 mg QHS. Pt medication last filled 08-19-2015. Tried contact pt twice on 12-28-15 and twice on 12-29-15 to sch f/u appt but lmtcb and number was provided. Called pt pharmacy to inform deny pt medication until pt call office to sch f/u appt. Spoke Tammy and informed her and she stated they did not mean to send request to office because it looks like Dr. Merrily Pew filled it on 12-29-15 but she will let pt know.

## 2016-01-06 NOTE — Telephone Encounter (Signed)
noted 

## 2016-05-01 ENCOUNTER — Other Ambulatory Visit (HOSPITAL_COMMUNITY): Payer: Self-pay | Admitting: Family Medicine

## 2016-05-01 DIAGNOSIS — Z1231 Encounter for screening mammogram for malignant neoplasm of breast: Secondary | ICD-10-CM

## 2016-05-06 IMAGING — DX DG CHEST 2V
2 series · 2 of 2 positions shown · non-contrast
Comparison: Portable chest x-ray September 04, 2014

CLINICAL DATA: Right ribcage pain associated with cough; no history
of injury ; history of COPD, current smoker.

EXAM:
CHEST  2 VIEW

[chest pa]
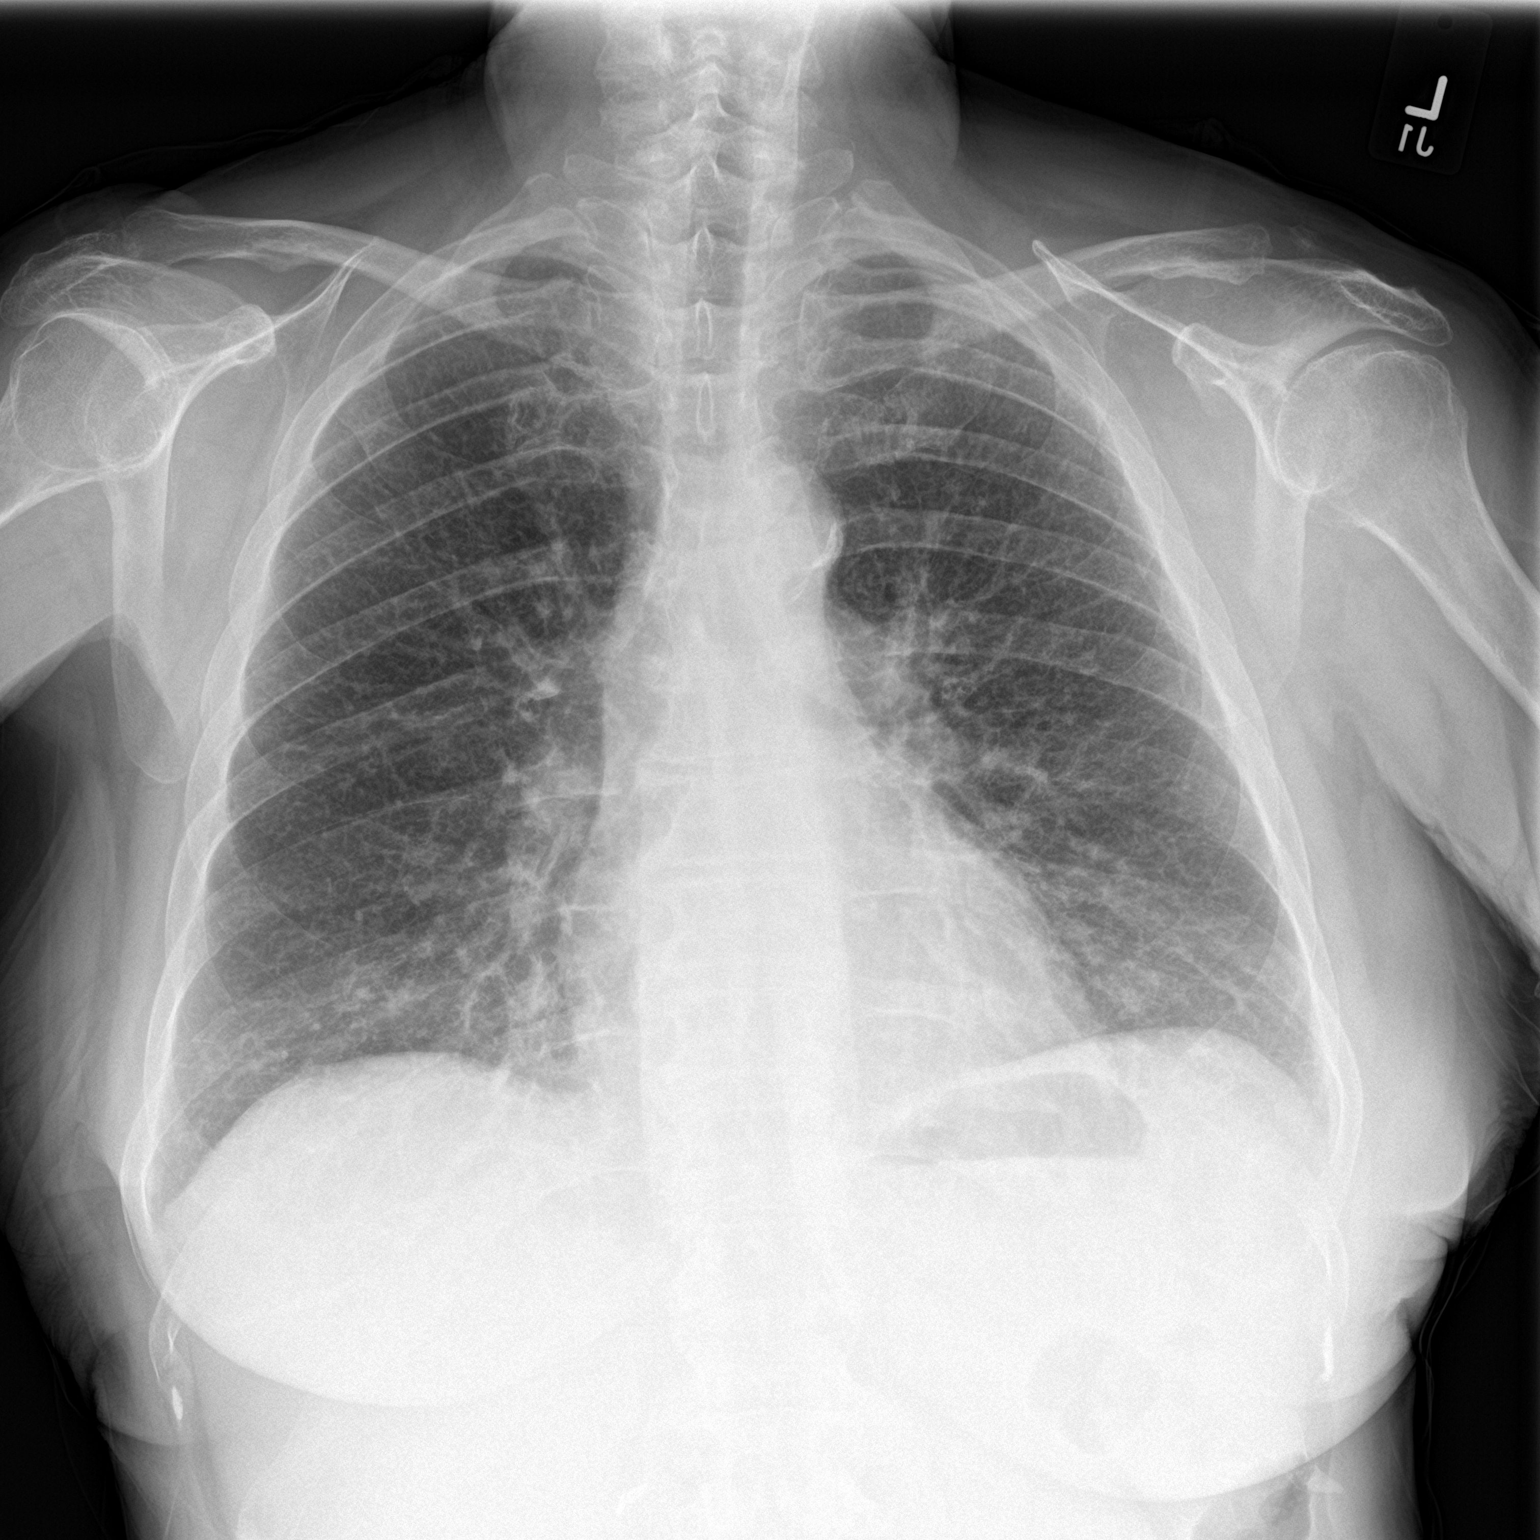

[chest lat]
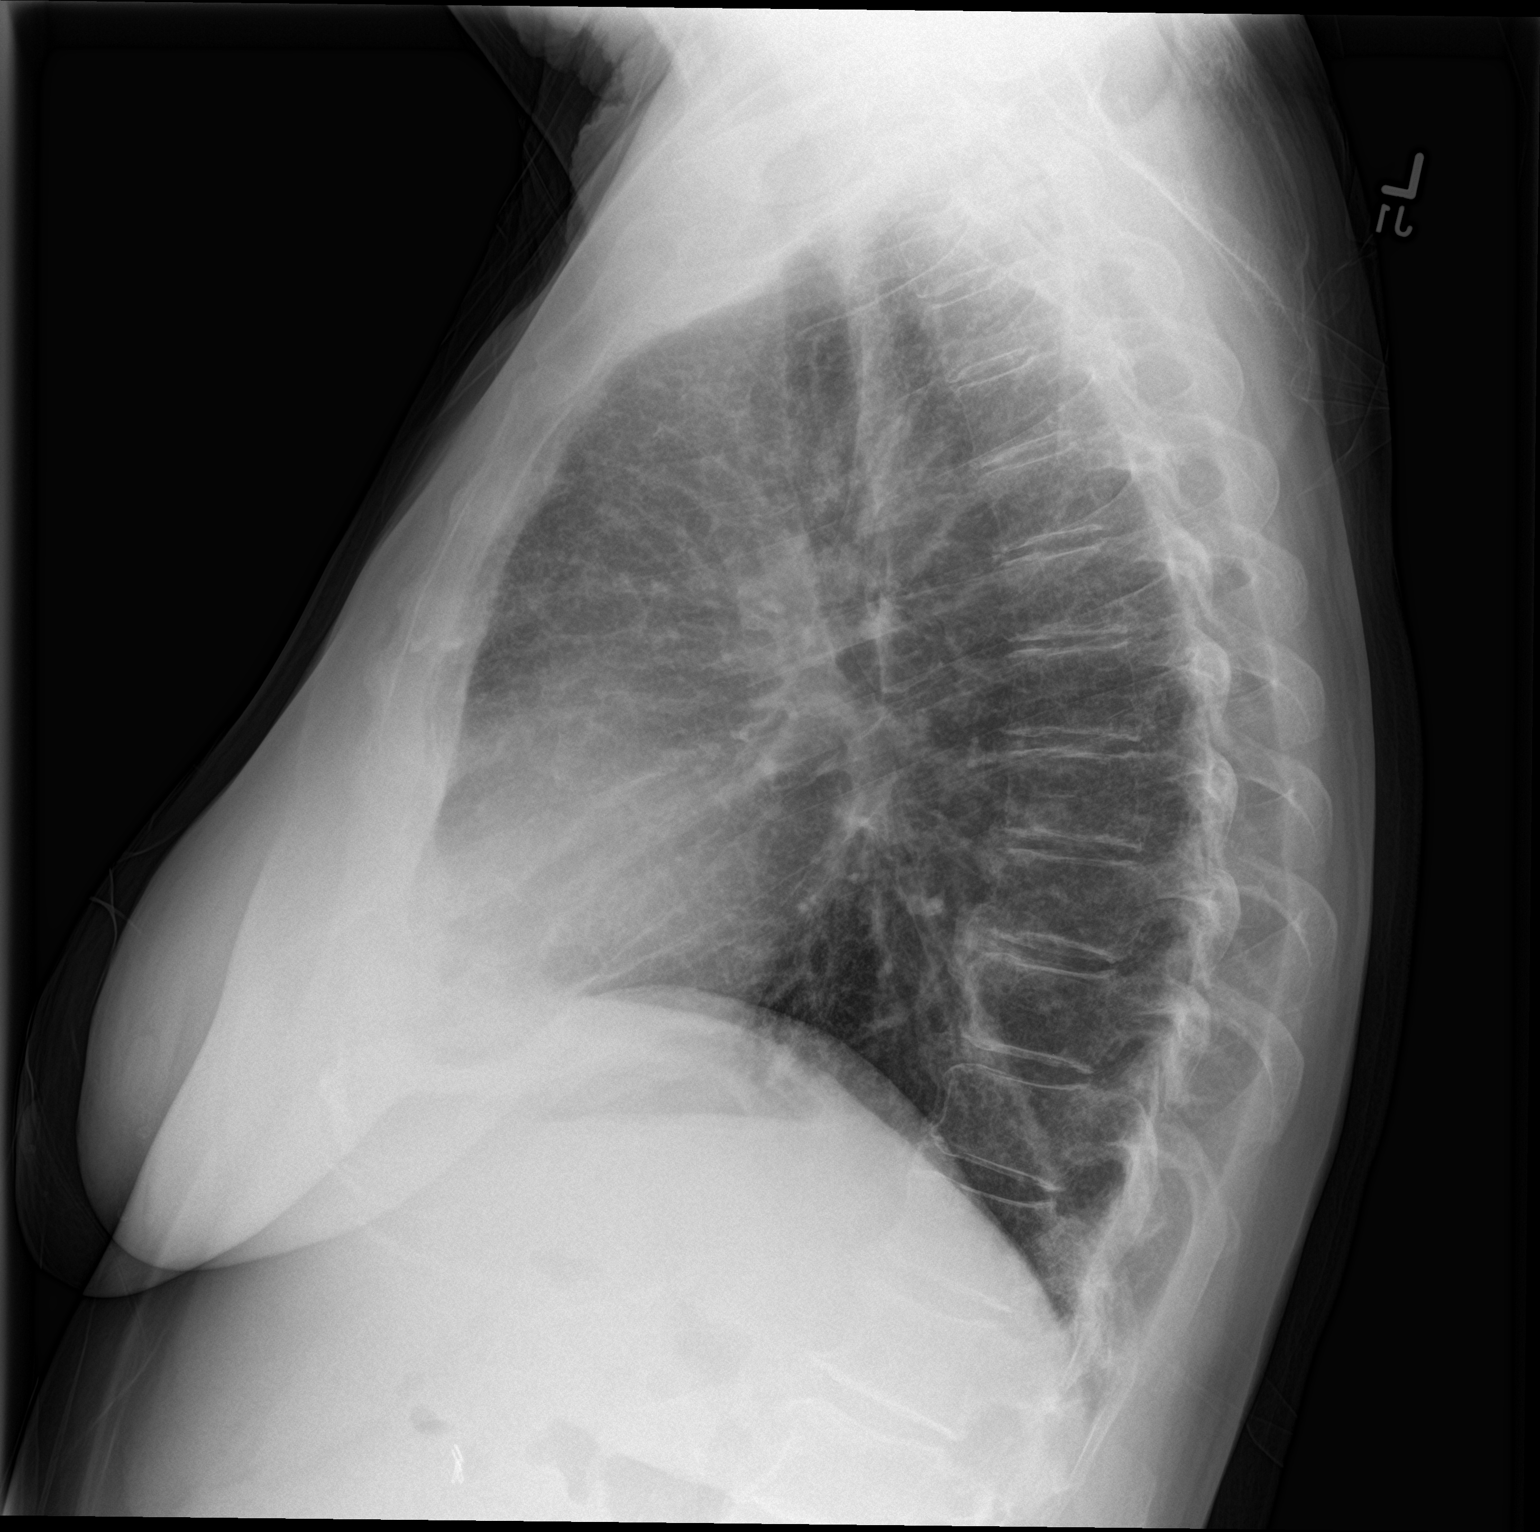

[2 of 2 positions shown; findings below may reference images not displayed]

FINDINGS: The lungs are well-expanded. The interstitial markings are coarse
bilaterally but stable. There is no alveolar infiltrate. There is no
pleural effusion or pneumothorax. The heart and pulmonary
vascularity are normal. The thoracic vertebral bodies are preserved
in height. No acute or healing right rib fracture is observed.
IMPRESSION: Chronic fibrotic changes consistent with COPD and the patient's
smoking history. There is no evidence of pneumonia. No acute right
rib fracture is observed.

## 2016-05-07 ENCOUNTER — Ambulatory Visit (HOSPITAL_COMMUNITY)
Admission: RE | Admit: 2016-05-07 | Discharge: 2016-05-07 | Disposition: A | Discharge status: Home / Self Care | Payer: Medicare HMO | Source: Ambulatory Visit | Attending: Family Medicine | Admitting: Family Medicine

## 2016-05-07 DIAGNOSIS — Z1231 Encounter for screening mammogram for malignant neoplasm of breast: Secondary | ICD-10-CM | POA: Insufficient documentation

## 2016-07-30 ENCOUNTER — Other Ambulatory Visit (HOSPITAL_COMMUNITY): Payer: Self-pay | Admitting: Family Medicine

## 2016-07-30 ENCOUNTER — Ambulatory Visit (HOSPITAL_COMMUNITY)
Admission: RE | Admit: 2016-07-30 | Discharge: 2016-07-30 | Disposition: A | Discharge status: Home / Self Care | Payer: Medicare HMO | Source: Ambulatory Visit | Attending: Family Medicine | Admitting: Family Medicine

## 2016-07-30 DIAGNOSIS — M19072 Primary osteoarthritis, left ankle and foot: Secondary | ICD-10-CM | POA: Insufficient documentation

## 2016-07-30 DIAGNOSIS — M25472 Effusion, left ankle: Secondary | ICD-10-CM | POA: Diagnosis not present

## 2016-07-30 DIAGNOSIS — M7732 Calcaneal spur, left foot: Secondary | ICD-10-CM | POA: Insufficient documentation

## 2016-07-30 DIAGNOSIS — Z9889 Other specified postprocedural states: Secondary | ICD-10-CM | POA: Diagnosis not present

## 2016-07-30 DIAGNOSIS — R52 Pain, unspecified: Secondary | ICD-10-CM

## 2016-07-30 DIAGNOSIS — X58XXXS Exposure to other specified factors, sequela: Secondary | ICD-10-CM | POA: Insufficient documentation

## 2016-07-30 DIAGNOSIS — S99912S Unspecified injury of left ankle, sequela: Secondary | ICD-10-CM | POA: Diagnosis not present

## 2017-04-12 ENCOUNTER — Ambulatory Visit: Payer: Medicare HMO | Admitting: Urology

## 2017-04-14 IMAGING — DX DG FOOT COMPLETE 3+V*L*
3 series · 3 of 3 positions shown · non-contrast
Comparison: None.

CLINICAL DATA: Five day history of pain laterally

EXAM:
LEFT FOOT - COMPLETE 3+ VIEW

[foot ap]
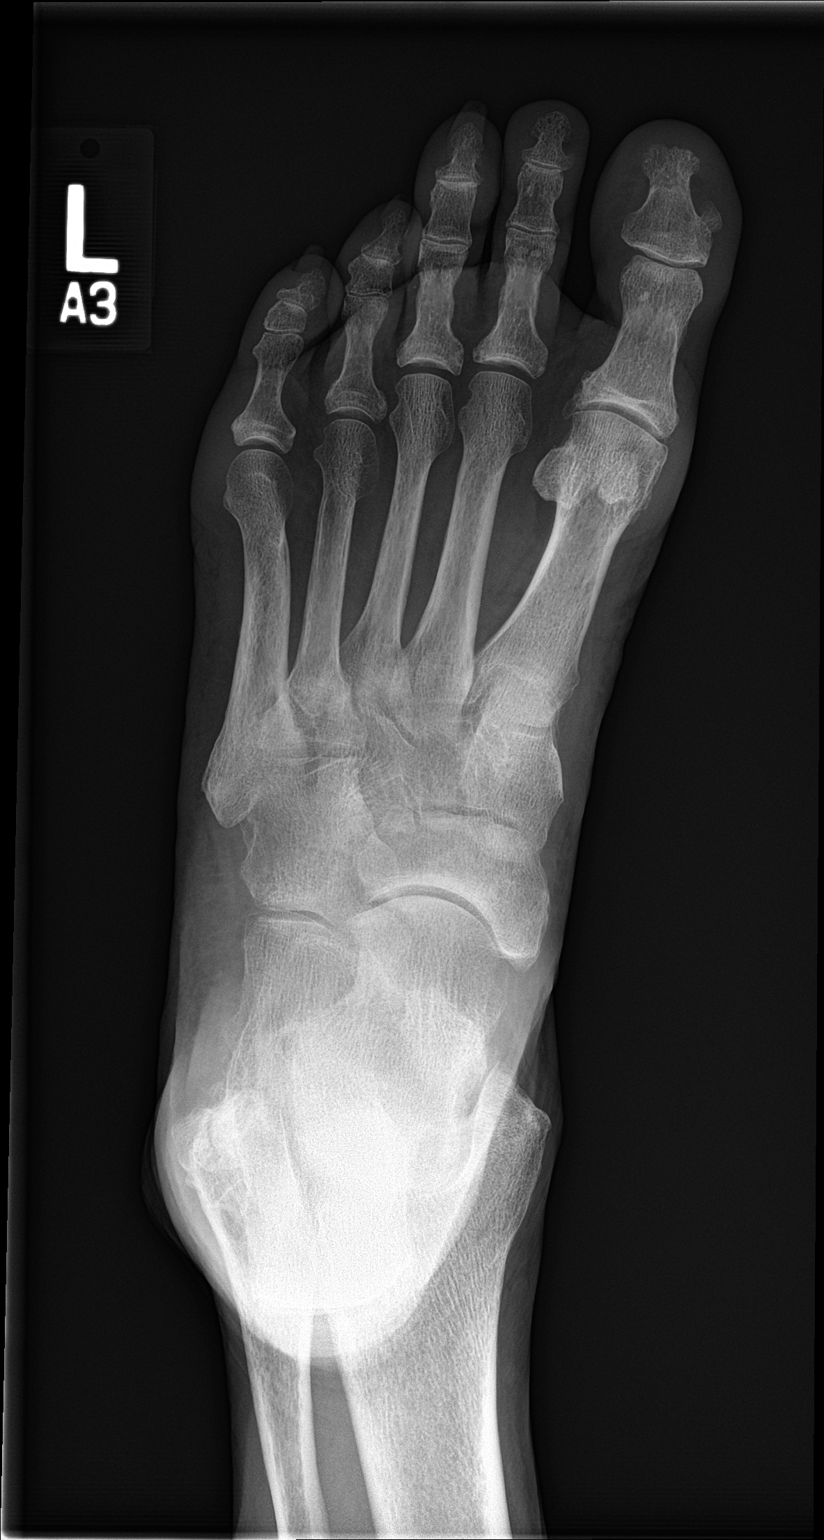

[foot obl]
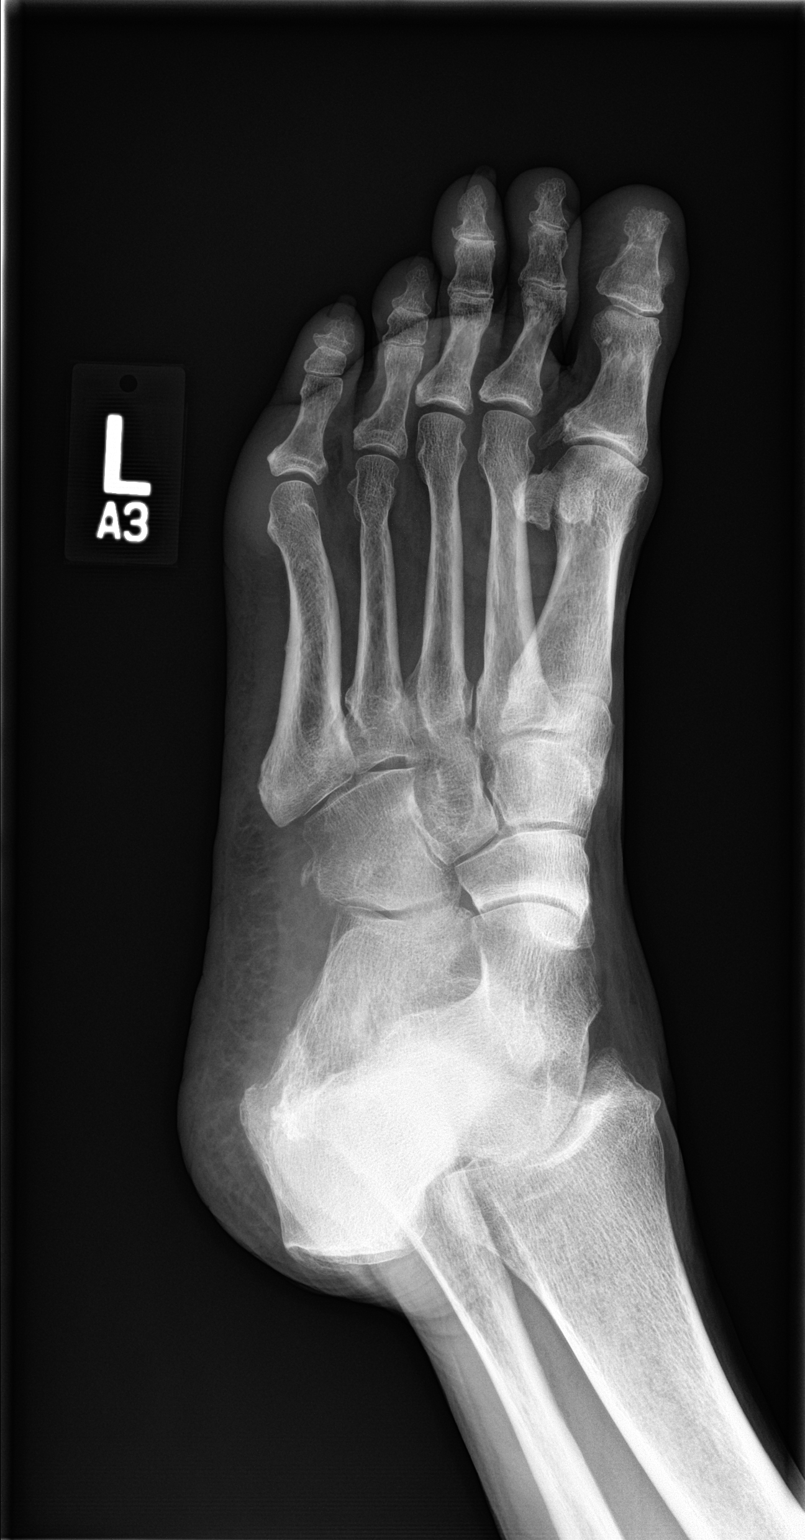

[foot lat]
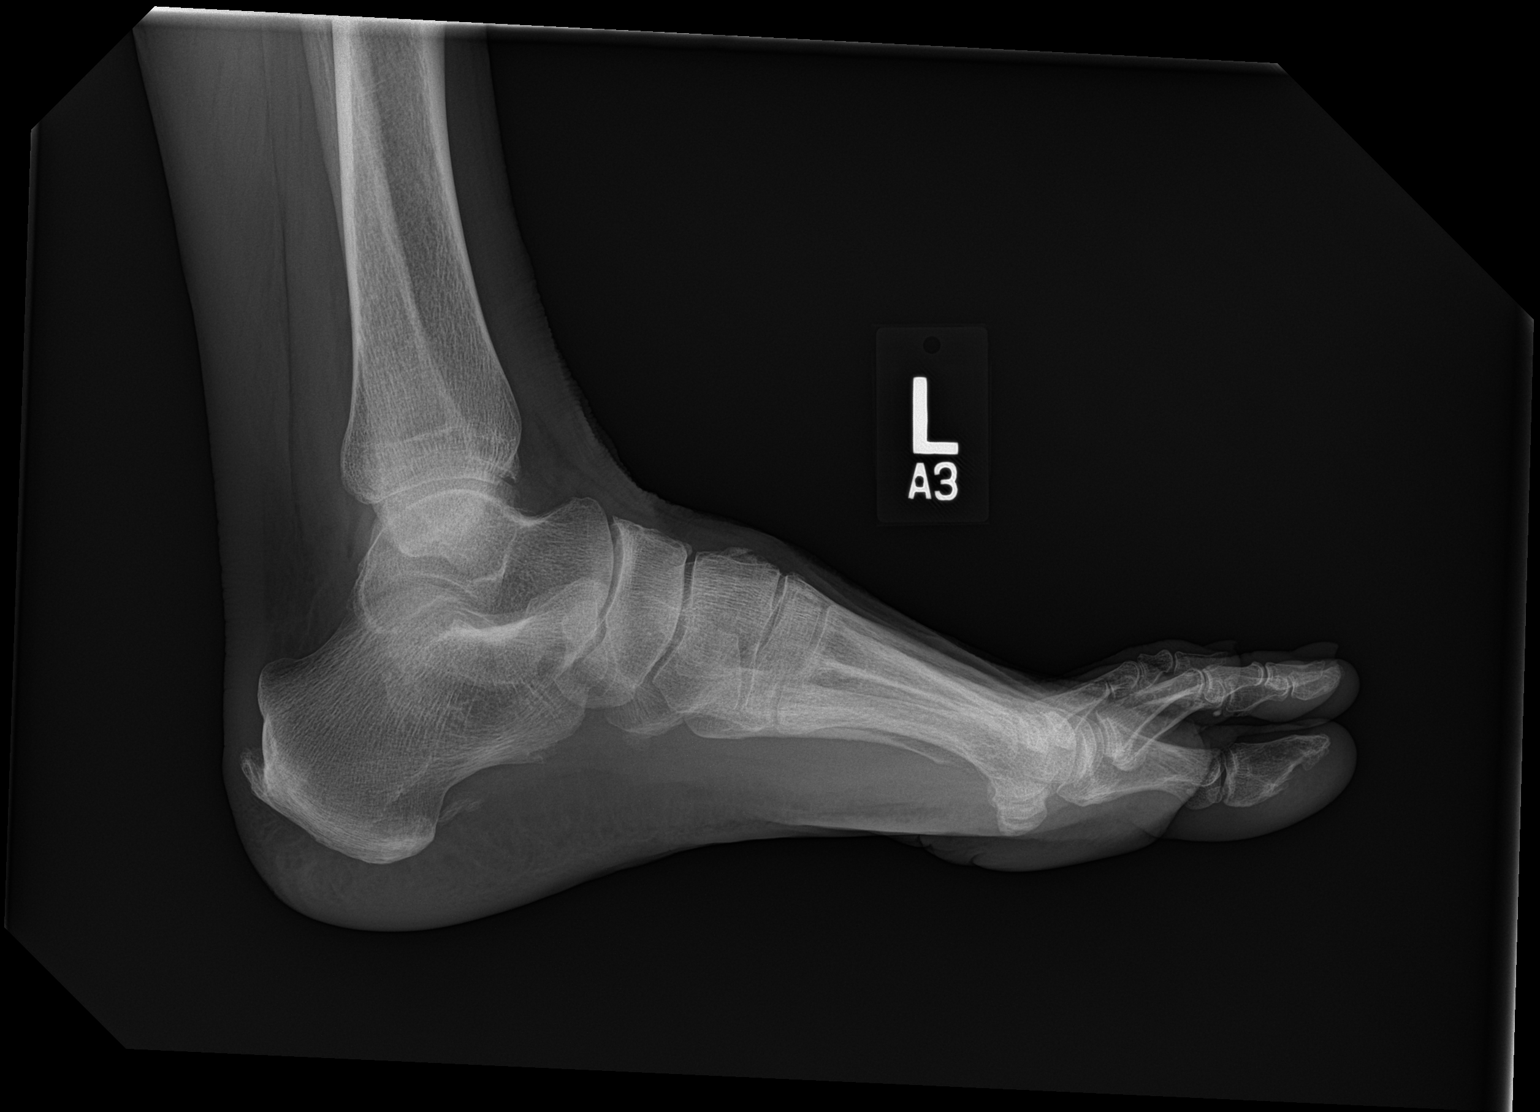

[3 of 3 positions shown; findings below may reference images not displayed]

FINDINGS: Frontal, oblique, and lateral views were obtained. There is no acute
fracture or dislocation. There is mild narrowing of the first MTP
joint. Other joint spaces appear unremarkable. There is a small
exostosis arising from the dorsal aspect of the cuboid. There are
calcaneal spurs inferiorly and posteriorly.
IMPRESSION: Areas of relatively mild osteoarthritic change, most notably in the
first MTP joint. There are calcaneal spurs. No fracture or
dislocation. No erosive changes.

## 2017-04-14 IMAGING — DX DG ANKLE COMPLETE 3+V*L*
3 series · 3 of 3 positions shown · non-contrast
Comparison: None.

CLINICAL DATA: Pain for 5 days.  No recent trauma

EXAM:
LEFT ANKLE COMPLETE - 3+ VIEW

[ankle ap]
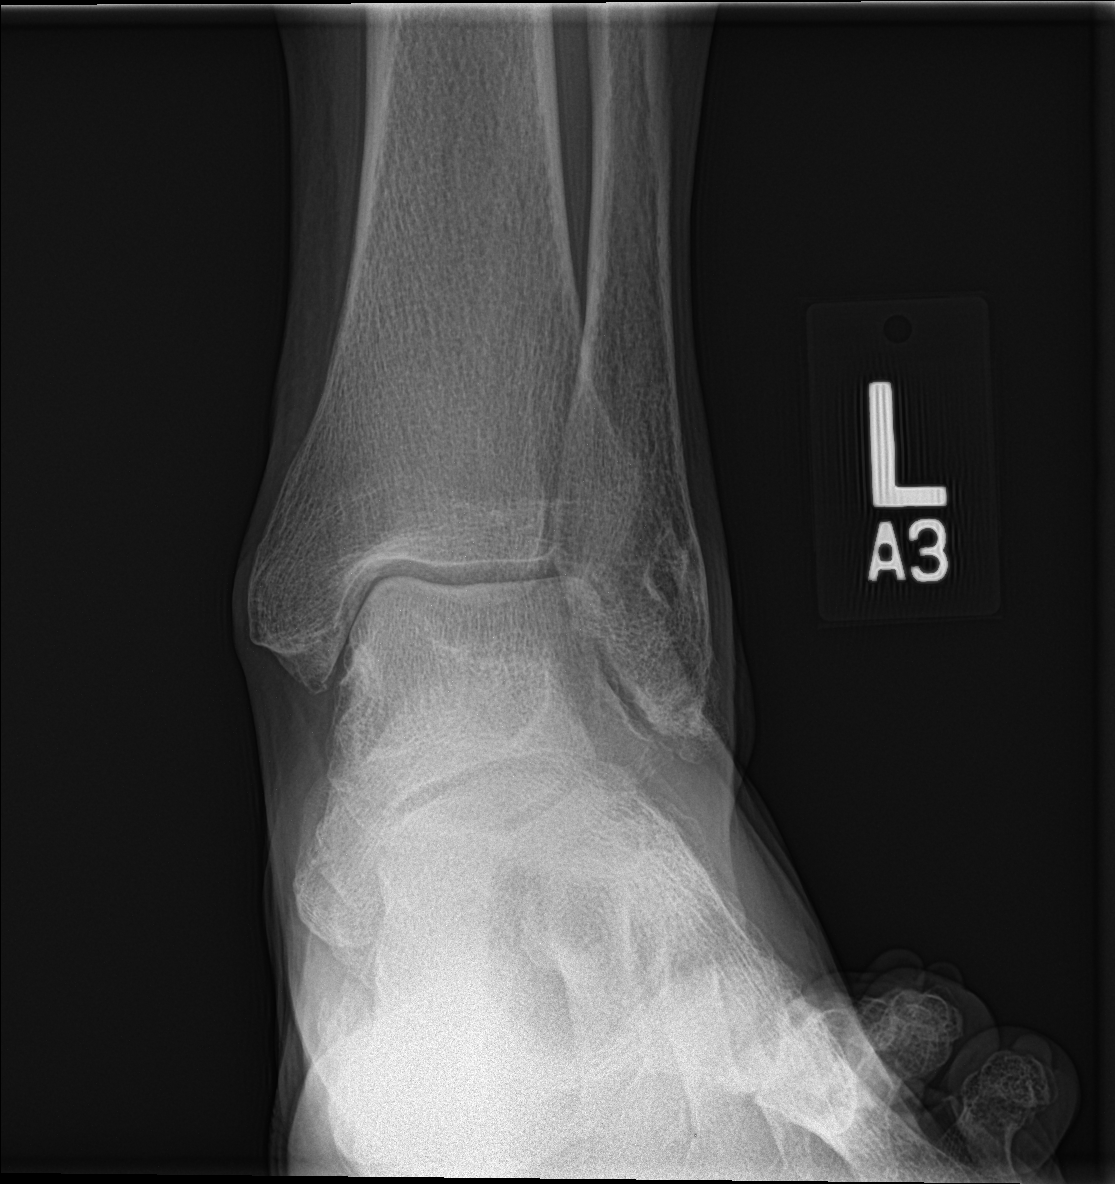

[ankle obl]
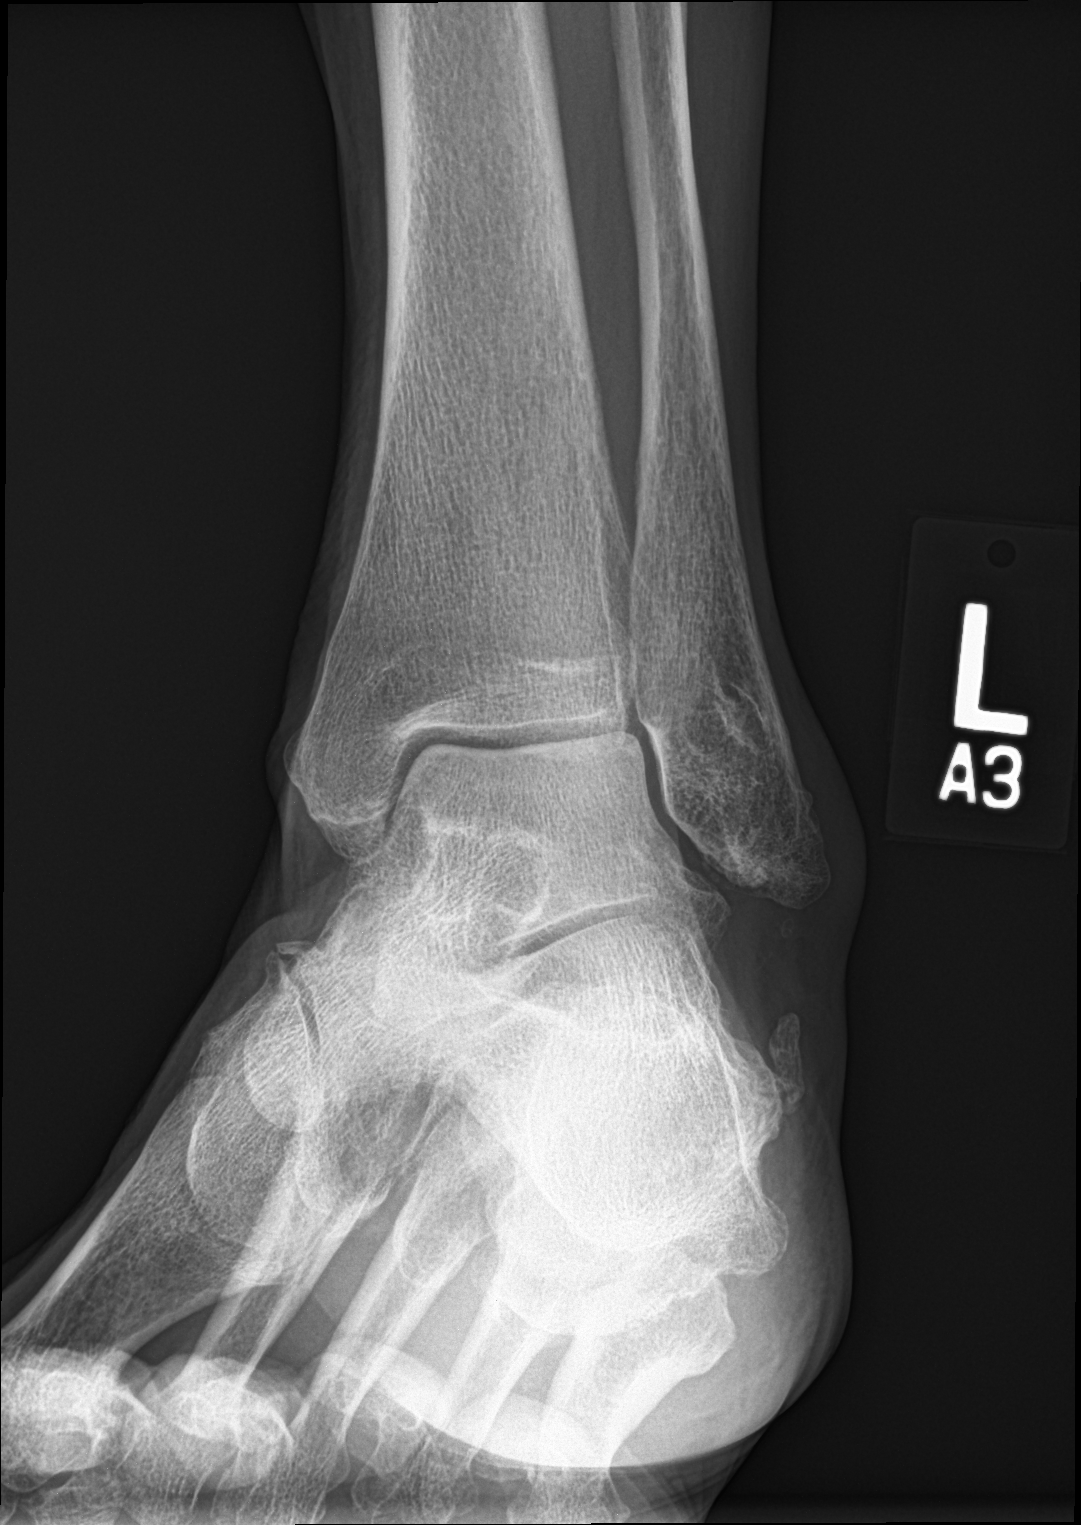

[ankle lat]
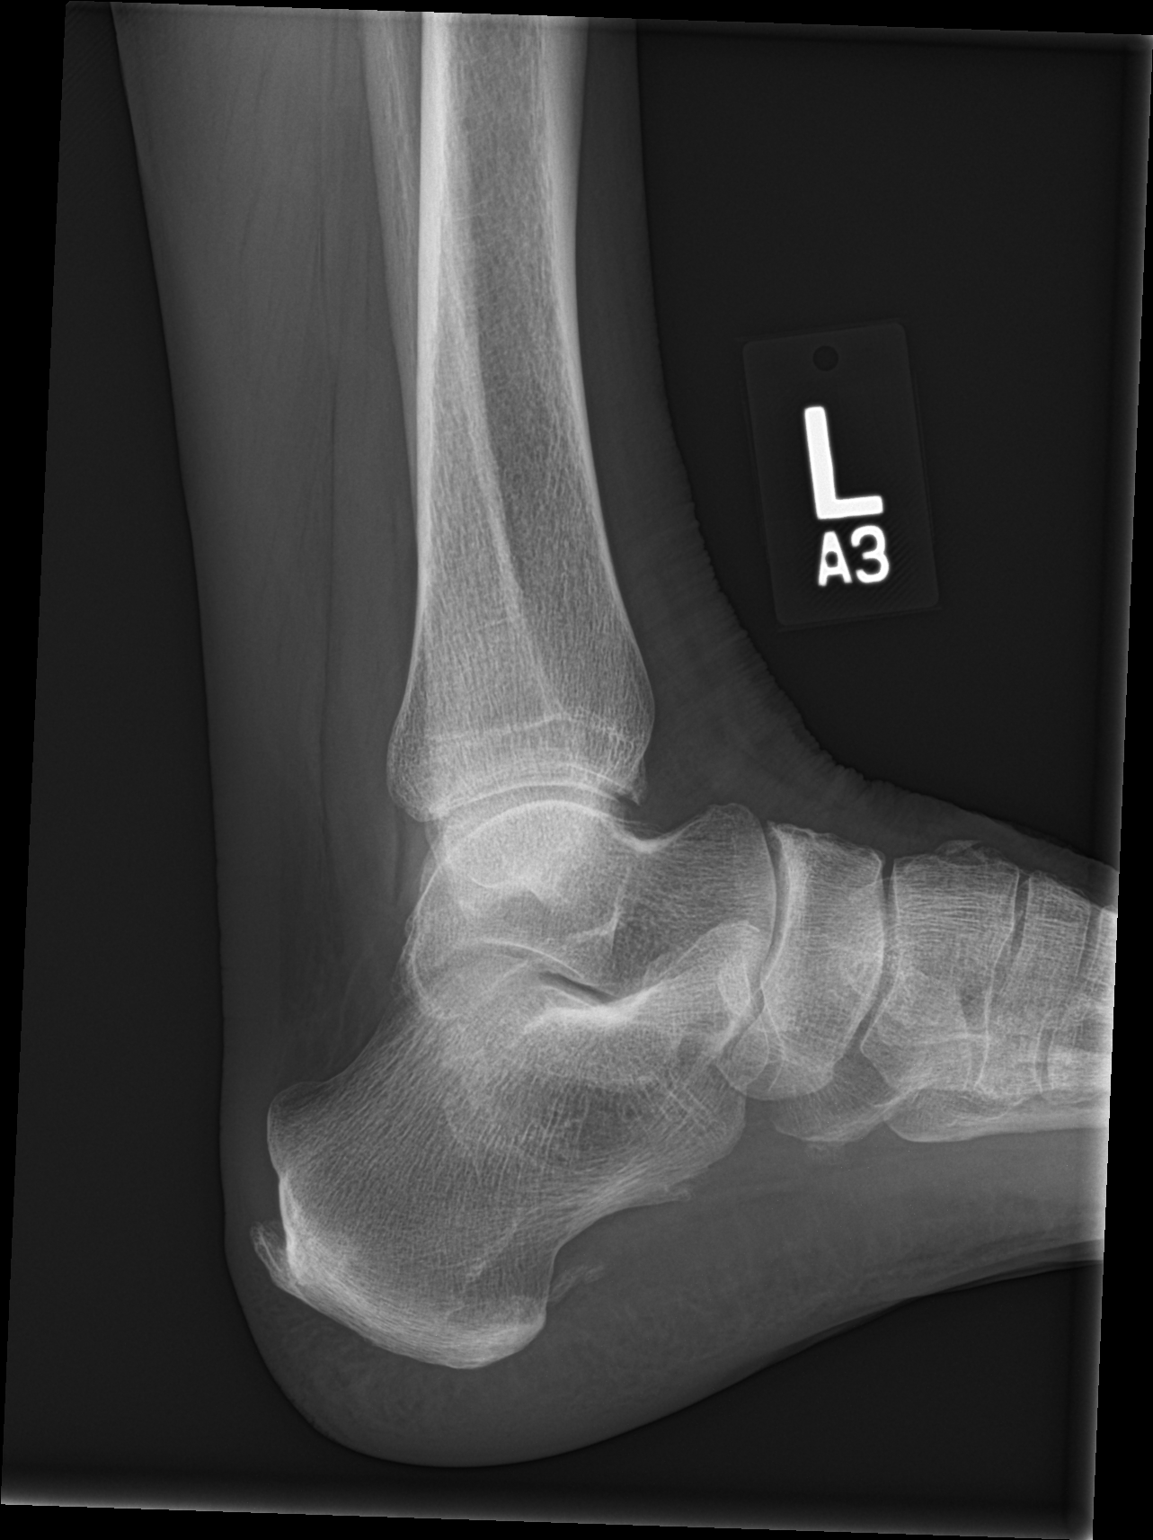

[3 of 3 positions shown; findings below may reference images not displayed]

FINDINGS: Frontal, oblique, and lateral views obtained. There is soft tissue
swelling laterally. There is evidence of old trauma in the lateral
malleolar region. There is no evident acute fracture or joint
effusion. The ankle mortise appears intact. There is mild narrowing
in the medial and lateral aspects of the ankle joint. There are
spurs arising from the posterior and inferior calcaneus.
IMPRESSION: Soft tissue swelling laterally. Evidence of old trauma lateral
malleolar region. No acute fracture. Ankle mortise appears intact.
There is generalized osteoarthritic change. There are calcaneal
spurs present.

## 2017-07-24 ENCOUNTER — Emergency Department (HOSPITAL_COMMUNITY)
Admission: EM | Admit: 2017-07-24 | Discharge: 2017-07-24 | Disposition: A | Discharge status: Home / Self Care | Payer: Medicare HMO | Attending: Emergency Medicine | Admitting: Emergency Medicine

## 2017-07-24 ENCOUNTER — Encounter (HOSPITAL_COMMUNITY): Payer: Self-pay

## 2017-07-24 DIAGNOSIS — E039 Hypothyroidism, unspecified: Secondary | ICD-10-CM | POA: Insufficient documentation

## 2017-07-24 DIAGNOSIS — X58XXXA Exposure to other specified factors, initial encounter: Secondary | ICD-10-CM | POA: Diagnosis not present

## 2017-07-24 DIAGNOSIS — S61412A Laceration without foreign body of left hand, initial encounter: Secondary | ICD-10-CM | POA: Insufficient documentation

## 2017-07-24 DIAGNOSIS — J449 Chronic obstructive pulmonary disease, unspecified: Secondary | ICD-10-CM | POA: Insufficient documentation

## 2017-07-24 DIAGNOSIS — E119 Type 2 diabetes mellitus without complications: Secondary | ICD-10-CM | POA: Diagnosis not present

## 2017-07-24 DIAGNOSIS — Y9389 Activity, other specified: Secondary | ICD-10-CM | POA: Diagnosis not present

## 2017-07-24 DIAGNOSIS — F17210 Nicotine dependence, cigarettes, uncomplicated: Secondary | ICD-10-CM | POA: Insufficient documentation

## 2017-07-24 DIAGNOSIS — Y999 Unspecified external cause status: Secondary | ICD-10-CM | POA: Insufficient documentation

## 2017-07-24 DIAGNOSIS — S6992XA Unspecified injury of left wrist, hand and finger(s), initial encounter: Secondary | ICD-10-CM | POA: Diagnosis present

## 2017-07-24 DIAGNOSIS — Y929 Unspecified place or not applicable: Secondary | ICD-10-CM | POA: Diagnosis not present

## 2017-07-24 DIAGNOSIS — Z79899 Other long term (current) drug therapy: Secondary | ICD-10-CM | POA: Insufficient documentation

## 2017-07-24 DIAGNOSIS — Z91040 Latex allergy status: Secondary | ICD-10-CM | POA: Diagnosis not present

## 2017-07-24 MED ORDER — TETANUS-DIPHTH-ACELL PERTUSSIS 5-2.5-18.5 LF-MCG/0.5 IM SUSP: 0.5000 mL | Freq: Once | INTRAMUSCULAR | Status: DC

## 2017-07-24 NOTE — ED Notes (Signed)
Skin tear noted in between the index & middle finger on the left hand. Bleeding controlled.

## 2017-07-24 NOTE — Discharge Instructions (Signed)
Our wound was repaired with Dermabond. This will come off on its own in about 5-7 days. Please do not pull on it. Please see your physician or return to the emergency department if any increased redness, red streaks going up the arm, or signs of advancing infection.
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
## 2017-07-24 NOTE — ED Provider Notes (Signed)
[facility] DEPT Provider Note   CSN: [id number] Arrival date & time: 07/24/17  1519     History   Chief Complaint Chief Complaint  Patient presents with  . skin tear    HPI [name] is a 66 y.o. female.  Patient is a 66 year old female who presents to the emergency department with a skin tear to the dorsum of the left hand.  The patient states that she was getting close from the cleaners, they were on 1 fingers and she sustained a skin tear to the dorsum of her left hand. She states that she does not recall the date of her last tetanus, and she was unsure as to how to best care for surgical into the emergency department for additional evaluation. It is of note the patient has had a traumatic amputation of the right hand. Patient also has history of diabetes.   The history is provided by the patient.    Past Medical History:  Diagnosis Date  . Allergic rhinitis   . 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 ASCVD (arteriosclerotic cardiovascular disease)    MI in 96 requiring BMS CX; DES to M1 in 2000;normal coronary angiography in 2004  . Cholelithiasis   . COPD (chronic obstructive pulmonary disease) ([place])   . DDD (degenerative disc disease), lumbar   . Depression   . Diabetes mellitus   . Diabetes mellitus type I ([place])   . DVT (deep venous thrombosis) ([place])   . GERD (gastroesophageal reflux disease)   . Hyperlipidemia   . Hypothyroidism   . Low back pain   . Nephrolithiasis 2006   stone extraction   . Peripheral neuropathy   . Sciatic pain    right  . Tobacco abuse   . Tremor     Patient Active Problem List   Diagnosis Date Noted  . Weakness 09/04/2014  . Dizziness 09/04/2014  . Hepatomegaly 03/31/2014  . Esophageal dysphagia 03/31/2014  . Unspecified constipation 03/31/2014  . Acute renal failure ([place]) 10/30/2013  . Altered mental status 09/28/2013  . ETOH abuse 09/28/2013  . Altered mental state 09/28/2013  . Hyperkalemia 06/05/2013   . Bradycardia 06/05/2013  . Edema 06/02/2013  . Hyponatremia 06/02/2013  . Pneumonia due to infectious agent 12/26/2012  . Insomnia due to mental disorder 09/02/2012  . RECTAL PAIN 09/04/2010  . CHEST PAIN 08/22/2010  . Diabetes mellitus ([place]) 06/16/2010  . Cardiovascular disease 06/16/2010  . MICROALBUMINURIA 06/01/2009  . 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 OTHER DYSPHAGIA 01/05/2009  . COLONIC POLYPS, ADENOMATOUS, HX OF 01/05/2009  . SCHATZKI'S RING, HX OF 01/05/2009  . BACK PAIN 08/04/2008  . DIARRHEA, CHRONIC 08/04/2008  . TOBACCO ABUSE 06/03/2007  . IBS 06/03/2007  . NEPHROLITHIASIS 02/04/2007  . ANXIETY STATE NOS 11/26/2006  . Hypothyroidism 11/01/2006  . HYPERLIPIDEMIA 11/01/2006  . Depression 11/01/2006  . PERIPHERAL NEUROPATHY 11/01/2006  . CATARACT NOS 11/01/2006  . ALLERGIC RHINITIS 11/01/2006  . COPD (chronic obstructive pulmonary disease) ([place]) 11/01/2006  . GERD 11/01/2006  . DEGENERATION, DISC NOS 11/01/2006  . LOW BACK PAIN 11/01/2006  . DVT, HX OF 11/01/2006    Past Surgical History:  Procedure Laterality Date  . BACK SURGERY    . CHOLECYSTECTOMY    . COLONOSCOPY  01/2009   QMG:QQPYPP rectum/repeat in 5 yrs  . COLONOSCOPY N/A 04/29/2014   Dr.Rourk- attempted/incomplete colonoscopy. inadequate prep  . COLONOSCOPY N/A 05/27/2014   JKD:TOIZTIWPY coli. Colonic polyps-removed as described above.Status post segmental biopsy  .  DILATION AND CURETTAGE OF UTERUS  1974  . ESOPHAGOGASTRODUODENOSCOPY  05/2010   Dr. Tessie Fass, erosion. 46F dilation  . ESOPHAGOGASTRODUODENOSCOPY N/A 04/29/2014   Dr.Rourk- normal esophagus s/p passage of maloney dilator. small hiatal hernia- bx= chronic inflammation.  Marland Kitchen HAND AMPUTATION Right    traumatic  . MALONEY DILATION N/A 04/29/2014   Procedure: Venia Minks DILATION;  Surgeon: Daneil Dolin, MD;  Location[facility];  Service: Endoscopy;  Laterality: N/A;  . PARTIAL HYSTERECTOMY  1978  . SHOULDER SURGERY     Left shoulder  for RTC;left arm surgery '98/left hand surgery 2001  . TOTAL ABDOMINAL HYSTERECTOMY W/ BILATERAL SALPINGOOPHORECTOMY  2002  . UMBILICAL HERNIA REPAIR  2008    OB History    Gravida Para Term Preterm AB Living   3 2 2   1      SAB TAB Ectopic Multiple Live Births   1               Home Medications    Prior to Admission medications   Medication Sig Start Date End Date Taking? Authorizing Provider  albuterol (PROVENTIL HFA;VENTOLIN HFA) 108 (90 BASE) MCG/ACT inhaler Inhale 2 puffs into the lungs every 4 (four) hours as needed for wheezing or shortness of breath. 10/02/15   Horton, Barbette Hair, MD  citalopram (CELEXA) 20 MG tablet Take 1 tablet by mouth daily. 08/19/15   [provider]  clonazePAM (KLONOPIN) 0.5 MG tablet Take 1 tablet by mouth at bedtime. 09/19/15   [provider]  levothyroxine (SYNTHROID, LEVOTHROID) 200 MCG tablet Take 200 mcg by mouth daily before breakfast.    [provider]  lisinopril (PRINIVIL,ZESTRIL) 20 MG tablet Take 20 mg by mouth daily.      [provider]  omeprazole (PRILOSEC) 20 MG capsule Take 1 capsule by mouth daily. 08/23/15   [provider]  OVER THE COUNTER MEDICATION Place 2 sprays into the nose 2 (two) times daily as needed (for congestion). 4 Way Nasal Spray    [provider]  oxyCODONE-acetaminophen (PERCOCET/ROXICET) 5-325 MG tablet Take 1-2 tablets by mouth every 4 (four) hours as needed for severe pain. 09/20/15   Nat Christen, MD  pravastatin (PRAVACHOL) 40 MG tablet Take 40 mg by mouth daily.      [provider]  predniSONE (DELTASONE) 20 MG tablet Take 3 tablets (60 mg total) by mouth daily. 10/02/15   Horton, Barbette Hair, MD  promethazine (PHENERGAN) 25 MG tablet Take 1 tablet (25 mg total) by mouth every 6 (six) hours as needed. 09/20/15   Nat Christen, MD  QUEtiapine (SEROQUEL) 25 MG tablet Take 1 tablet (25 mg total) by mouth at bedtime. 07/26/15 07/25/16  Cloria Spring, MD    traZODone (DESYREL) 100 MG tablet Take 1 tablet by mouth at bedtime. 08/19/15   [provider]    Family History Family History  Problem Relation Age of Onset  . Depression Mother   . Bipolar disorder Mother   . Dementia Mother   . Pulmonary fibrosis Mother   . Alcohol abuse Father   . Aneurysm Father        deceased age 21, brain  . Colon cancer Paternal Grandfather        age greater than 25    Social History Social History  Substance Use Topics  . Smoking status: Current Every Day Smoker    Packs/day: 0.50    Years: 40.00    Types: Cigarettes  . Smokeless tobacco: Never Used  Comment: 1/2 pack per day  . Alcohol use No     Allergies   Iohexol; Tape; Ciprofloxacin; Latex; Neomycin-bacitracin zn-polymyx; Penicillins; and Povidone-iodine   Review of Systems Review of Systems  Constitutional: Negative for activity change.       All ROS Neg except as noted in HPI  HENT: Negative for nosebleeds.   Eyes: Negative for photophobia and discharge.  Respiratory: Negative for cough, shortness of breath and wheezing.   Cardiovascular: Negative for chest pain and palpitations.  Gastrointestinal: Negative for abdominal pain and blood in stool.  Genitourinary: Negative for dysuria, frequency and hematuria.  Musculoskeletal: Negative for arthralgias, back pain and neck pain.  Skin: Negative.   Neurological: Negative for dizziness, seizures and speech difficulty.  Psychiatric/Behavioral: Negative for confusion and hallucinations.     Physical Exam Updated Vital Signs BP (!) 130/53 (BP Location: Left Arm)   Pulse 70   Temp 98.1 F (36.7 C) (Oral)   Resp 18   Ht 5' 6"  (1.676 m)   Wt 64.4 kg (142 lb)   SpO2 97%   BMI 22.92 kg/m   Physical Exam  Constitutional: She is oriented to person, place, and time. She appears well-developed and well-nourished.  Non-toxic appearance.  HENT:  Head: Normocephalic.  Right Ear: Tympanic membrane and external ear normal.   Left Ear: Tympanic membrane and external ear normal.  Eyes: Pupils are equal, round, and reactive to light. EOM and lids are normal.  Neck: Normal range of motion. Neck supple. Carotid bruit is not present.  Cardiovascular: Normal rate, regular rhythm, normal heart sounds, intact distal pulses and normal pulses.   Pulmonary/Chest: Breath sounds normal. No respiratory distress.  Abdominal: Soft. Bowel sounds are normal. There is no tenderness. There is no guarding.  Musculoskeletal: Normal range of motion.       Left hand: She exhibits laceration.       Hands: There is full range of motion of the left wrist and fingers. There are 2 small skin tear areas noted of the dorsum of the hand. Capillary refill is less than 2 seconds.  Lymphadenopathy:       Head (right side): No submandibular adenopathy present.       Head (left side): No submandibular adenopathy present.    She has no cervical adenopathy.  Neurological: She is alert and oriented to person, place, and time. She has normal strength. No cranial nerve deficit or sensory deficit.  Skin: Skin is warm and dry.  Psychiatric: She has a normal mood and affect. Her speech is normal.  Nursing note and vitals reviewed.    ED Treatments / Results  Labs (all labs ordered are listed, but only abnormal results are displayed) Labs Reviewed - No data to display  EKG  EKG Interpretation None       Radiology No results found.  Procedures .Marland KitchenLaceration Repair Date/Time: 07/24/2017 4:00 PM Performed by: Lily Kocher Authorized by: Lily Kocher   Consent:    Consent obtained:  Verbal   Consent given by:  Patient   Risks discussed:  Infection, poor cosmetic result and poor wound healing Anesthesia (see MAR for exact dosages):    Anesthesia method:  None Laceration details:    Location:  Hand   Hand location:  L hand, dorsum   Length (cm):  1 (2 skin tears 0.5cm each) Repair type:    Repair type:  Simple Pre-procedure  details:    Preparation:  Patient was prepped and draped in usual sterile fashion Exploration:  Hemostasis achieved with:  Direct pressure   Wound exploration: wound explored through full range of motion     Wound extent: no foreign bodies/material noted, no tendon damage noted, no underlying fracture noted and no vascular damage noted   Treatment:    Area cleansed with:  Soap and water   Irrigation solution:  Tap water Skin repair:    Repair method:  Tissue adhesive Post-procedure details:    Dressing:  Open (no dressing)   (including critical care time)  Medications Ordered in ED Medications - No data to display   Initial Impression / Assessment and Plan / ED Course  I have reviewed the triage vital signs and the nursing notes.  Pertinent labs & imaging results that were available during my care of the patient were reviewed by me and considered in my medical decision making (see chart for details).       Final Clinical Impressions(s) / ED Diagnoses MDM Vital signs within normal limits. Patient had tetanus status updated in 2016 according to records. The laceration/skin tear to the left hand was repaired with Dermabond. We discussed the need to return if any signs of advancing infection. Patient is in agreement with this plan.   Final diagnoses:  Laceration of skin of left hand, initial encounter    New Prescriptions New Prescriptions   No medications on file     Annette Stable 07/24/17 Putnam, Nathan, MD 07/24/17 [id number]

## 2017-07-24 NOTE — ED Notes (Signed)
Pt alert & oriented x4, stable gait. Patient given discharge instructions, paperwork & prescription(s). Patient verbalized understanding. Pt left department w/ no further questions.

## 2017-07-24 NOTE — ED Notes (Signed)
Hand cleaned w/ Shur Clens. Pt informed PA will be covering w/ a skin glue.

## 2018-07-14 ENCOUNTER — Other Ambulatory Visit (HOSPITAL_COMMUNITY): Payer: Self-pay | Admitting: Family Medicine

## 2018-07-15 ENCOUNTER — Other Ambulatory Visit (HOSPITAL_COMMUNITY): Payer: Self-pay | Admitting: Family Medicine

## 2018-07-15 DIAGNOSIS — N6320 Unspecified lump in the left breast, unspecified quadrant: Secondary | ICD-10-CM

## 2018-07-22 ENCOUNTER — Ambulatory Visit (HOSPITAL_COMMUNITY)
Admission: RE | Admit: 2018-07-22 | Discharge: 2018-07-22 | Disposition: A | Discharge status: Home / Self Care | Payer: Medicare HMO | Source: Ambulatory Visit | Attending: Family Medicine | Admitting: Family Medicine

## 2018-07-22 ENCOUNTER — Ambulatory Visit (HOSPITAL_COMMUNITY): Payer: Medicare HMO

## 2018-07-22 DIAGNOSIS — N6320 Unspecified lump in the left breast, unspecified quadrant: Secondary | ICD-10-CM

## 2018-08-30 ENCOUNTER — Other Ambulatory Visit: Payer: Self-pay

## 2018-08-30 ENCOUNTER — Emergency Department (HOSPITAL_COMMUNITY)
Admission: EM | Admit: 2018-08-30 | Discharge: 2018-08-30 | Disposition: A | Discharge status: Home / Self Care | Payer: Medicare HMO | Attending: Emergency Medicine | Admitting: Emergency Medicine

## 2018-08-30 ENCOUNTER — Encounter (HOSPITAL_COMMUNITY): Payer: Self-pay | Admitting: Emergency Medicine

## 2018-08-30 DIAGNOSIS — Z87891 Personal history of nicotine dependence: Secondary | ICD-10-CM | POA: Diagnosis not present

## 2018-08-30 DIAGNOSIS — L0231 Cutaneous abscess of buttock: Secondary | ICD-10-CM | POA: Insufficient documentation

## 2018-08-30 DIAGNOSIS — E109 Type 1 diabetes mellitus without complications: Secondary | ICD-10-CM | POA: Diagnosis not present

## 2018-08-30 DIAGNOSIS — I2510 Atherosclerotic heart disease of native coronary artery without angina pectoris: Secondary | ICD-10-CM | POA: Insufficient documentation

## 2018-08-30 DIAGNOSIS — E039 Hypothyroidism, unspecified: Secondary | ICD-10-CM | POA: Diagnosis not present

## 2018-08-30 DIAGNOSIS — Z79899 Other long term (current) drug therapy: Secondary | ICD-10-CM | POA: Diagnosis not present

## 2018-08-30 DIAGNOSIS — J449 Chronic obstructive pulmonary disease, unspecified: Secondary | ICD-10-CM | POA: Diagnosis not present

## 2018-08-30 DIAGNOSIS — L0291 Cutaneous abscess, unspecified: Secondary | ICD-10-CM

## 2018-08-30 MED ORDER — BACITRACIN ZINC 500 UNIT/GM EX OINT: TOPICAL_OINTMENT | Freq: Once | CUTANEOUS | Status: DC

## 2018-08-30 MED ORDER — MUPIROCIN CALCIUM 2 % EX CREA
TOPICAL_CREAM | Freq: Two times a day (BID) | CUTANEOUS | Status: DC
  Filled 2018-08-30: qty 15

## 2018-08-30 MED ORDER — BACITRACIN-NEOMYCIN-POLYMYXIN 400-5-5000 EX OINT
TOPICAL_OINTMENT | CUTANEOUS | Status: AC
  Filled 2018-08-30: qty 1

## 2018-08-30 MED ORDER — BACITRACIN-NEOMYCIN-POLYMYXIN 400-5-5000 EX OINT
TOPICAL_OINTMENT | Freq: Once | CUTANEOUS | Status: AC
  Administered 2018-08-30: 1 via TOPICAL

## 2018-08-30 NOTE — ED Provider Notes (Signed)
[facility] EMERGENCY DEPARTMENT Provider Note   CSN: [id number] Arrival date & time: 08/30/18  1150     History   Chief Complaint Chief Complaint  Patient presents with  . Abscess    HPI [name] is a 67 y.o. female.  The history is provided by the patient.  Abscess  Location:  Pelvis Pelvic abscess location:  R buttock Size:  Was quarter sized when she started keflex 6 days ago. smaller, but started burning this am after scrubbing site while in the shower Abscess quality: induration and painful   Abscess quality: not draining, no fluctuance and no itching   Abscess quality comment:  Burning Duration:  6 hours Progression:  Worsening Pain details:    Quality:  Burning   Severity:  Moderate   Timing:  Constant Chronicity:  New Context: diabetes   Relieved by:  Nothing (worsened when in hot shower this am) Associated symptoms: no fever, no nausea and no vomiting   Risk factors: no prior abscess     Past Medical History:  Diagnosis Date  . Allergic rhinitis   . 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 ASCVD (arteriosclerotic cardiovascular disease)    MI in 96 requiring BMS CX; DES to M1 in 2000;normal coronary angiography in 2004  . Cholelithiasis   . COPD (chronic obstructive pulmonary disease) ([place])   . DDD (degenerative disc disease), lumbar   . Depression   . Diabetes mellitus   . Diabetes mellitus type I ([place])   . DVT (deep venous thrombosis) ([place])   . GERD (gastroesophageal reflux disease)   . Hyperlipidemia   . Hypothyroidism   . Low back pain   . Nephrolithiasis 2006   stone extraction   . Peripheral neuropathy   . Sciatic pain    right  . Tobacco abuse   . Tremor     Patient Active Problem List   Diagnosis Date Noted  . Weakness 09/04/2014  . Dizziness 09/04/2014  . Hepatomegaly 03/31/2014  . Esophageal dysphagia 03/31/2014  . Unspecified constipation 03/31/2014  . Acute renal failure ([place]) 10/30/2013  . Altered mental  status 09/28/2013  . ETOH abuse 09/28/2013  . Altered mental state 09/28/2013  . Hyperkalemia 06/05/2013  . Bradycardia 06/05/2013  . Edema 06/02/2013  . Hyponatremia 06/02/2013  . Pneumonia due to infectious agent 12/26/2012  . Insomnia due to mental disorder 09/02/2012  . RECTAL PAIN 09/04/2010  . CHEST PAIN 08/22/2010  . Diabetes mellitus ([place]) 06/16/2010  . Cardiovascular disease 06/16/2010  . MICROALBUMINURIA 06/01/2009  . 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 OTHER DYSPHAGIA 01/05/2009  . COLONIC POLYPS, ADENOMATOUS, HX OF 01/05/2009  . SCHATZKI'S RING, HX OF 01/05/2009  . BACK PAIN 08/04/2008  . DIARRHEA, CHRONIC 08/04/2008  . TOBACCO ABUSE 06/03/2007  . IBS 06/03/2007  . NEPHROLITHIASIS 02/04/2007  . ANXIETY STATE NOS 11/26/2006  . Hypothyroidism 11/01/2006  . HYPERLIPIDEMIA 11/01/2006  . Depression 11/01/2006  . PERIPHERAL NEUROPATHY 11/01/2006  . CATARACT NOS 11/01/2006  . ALLERGIC RHINITIS 11/01/2006  . COPD (chronic obstructive pulmonary disease) ([place]) 11/01/2006  . GERD 11/01/2006  . DEGENERATION, DISC NOS 11/01/2006  . LOW BACK PAIN 11/01/2006  . DVT, HX OF 11/01/2006    Past Surgical History:  Procedure Laterality Date  . BACK SURGERY    . CHOLECYSTECTOMY    . COLONOSCOPY  01/2009   QQP:YPPJKD rectum/repeat in 5 yrs  . COLONOSCOPY N/A 04/29/2014   Dr.Rourk- attempted/incomplete colonoscopy. inadequate prep  . COLONOSCOPY N/A  05/27/2014   EHU:[id number] coli. Colonic polyps-removed as described above.Status post segmental biopsy  . DILATION AND CURETTAGE OF UTERUS  1974  . ESOPHAGOGASTRODUODENOSCOPY  05/2010   Dr. Tessie Fass, erosion. 11F dilation  . ESOPHAGOGASTRODUODENOSCOPY N/A 04/29/2014   Dr.Rourk- normal esophagus s/p passage of maloney dilator. small hiatal hernia- bx= chronic inflammation.  Marland Kitchen HAND AMPUTATION Right    traumatic  . MALONEY DILATION N/A 04/29/2014   Procedure: Venia Minks DILATION;  Surgeon: Daneil Dolin, MD;  Location[facility];   Service: Endoscopy;  Laterality: N/A;  . PARTIAL HYSTERECTOMY  1978  . SHOULDER SURGERY     Left shoulder for RTC;left arm surgery '98/left hand surgery 2001  . TOTAL ABDOMINAL HYSTERECTOMY W/ BILATERAL SALPINGOOPHORECTOMY  2002  . UMBILICAL HERNIA REPAIR  2008     OB History    Gravida  3   Para  2   Term  2   Preterm      AB  1   Living        SAB  1   TAB      Ectopic      Multiple      Live Births               Home Medications    Prior to Admission medications   Medication Sig Start Date End Date Taking? Authorizing Provider  albuterol (PROVENTIL HFA;VENTOLIN HFA) 108 (90 BASE) MCG/ACT inhaler Inhale 2 puffs into the lungs every 4 (four) hours as needed for wheezing or shortness of breath. 10/02/15   Horton, Barbette Hair, MD  citalopram (CELEXA) 20 MG tablet Take 1 tablet by mouth daily. 08/19/15   [provider]  clonazePAM (KLONOPIN) 0.5 MG tablet Take 1 tablet by mouth at bedtime. 09/19/15   [provider]  levothyroxine (SYNTHROID, LEVOTHROID) 200 MCG tablet Take 200 mcg by mouth daily before breakfast.    [provider]  lisinopril (PRINIVIL,ZESTRIL) 20 MG tablet Take 20 mg by mouth daily.      [provider]  omeprazole (PRILOSEC) 20 MG capsule Take 1 capsule by mouth daily. 08/23/15   [provider]  OVER THE COUNTER MEDICATION Place 2 sprays into the nose 2 (two) times daily as needed (for congestion). 4 Way Nasal Spray    [provider]  oxyCODONE-acetaminophen (PERCOCET/ROXICET) 5-325 MG tablet Take 1-2 tablets by mouth every 4 (four) hours as needed for severe pain. 09/20/15   Nat Christen, MD  pravastatin (PRAVACHOL) 40 MG tablet Take 40 mg by mouth daily.      [provider]  predniSONE (DELTASONE) 20 MG tablet Take 3 tablets (60 mg total) by mouth daily. 10/02/15   Horton, Barbette Hair, MD  promethazine (PHENERGAN) 25 MG tablet Take 1 tablet (25 mg total) by mouth every 6 (six) hours as  needed. 09/20/15   Nat Christen, MD  QUEtiapine (SEROQUEL) 25 MG tablet Take 1 tablet (25 mg total) by mouth at bedtime. 07/26/15 07/25/16  Cloria Spring, MD  traZODone (DESYREL) 100 MG tablet Take 1 tablet by mouth at bedtime. 08/19/15   [provider]    Family History Family History  Problem Relation Age of Onset  . Depression Mother   . Bipolar disorder Mother   . Dementia Mother   . Pulmonary fibrosis Mother   . Alcohol abuse Father   . Aneurysm Father        deceased age 67, brain  . Colon cancer Paternal Grandfather  age greater than 57    Social History Social History   Tobacco Use  . Smoking status: Former Smoker    Packs/day: 0.50    Years: 40.00    Pack years: 20.00    Types: Cigarettes    Last attempt to quit: 08/30/2017    Years since quitting: 1.0  . Smokeless tobacco: Never Used  . Tobacco comment: 1/2 pack per day  Substance Use Topics  . Alcohol use: No  . Drug use: No     Allergies   Iohexol; Tape; Ciprofloxacin; Latex; Penicillins; and Povidone-iodine   Review of Systems Review of Systems  Constitutional: Negative for chills and fever.  Respiratory: Negative for shortness of breath and wheezing.   Gastrointestinal: Negative for nausea and vomiting.  Skin: Positive for wound.  Neurological: Negative for numbness.     Physical Exam Updated Vital Signs BP (!) 131/111 (BP Location: Right Arm)   Pulse 89   Temp 98.4 F (36.9 C) (Oral)   Resp 18   Wt 64.4 kg   SpO2 96%   BMI 22.92 kg/m   Physical Exam  Constitutional: She is oriented to person, place, and time. She appears well-developed and well-nourished.  HENT:  Head: Normocephalic.  Cardiovascular: Normal rate.  Pulmonary/Chest: Effort normal.  Musculoskeletal: She exhibits no tenderness.  Neurological: She is alert and oriented to person, place, and time. No sensory deficit.  Skin:  0.5 cm excoriated appearing papule right medial buttock. Slightly raised without  fluctuance, mild induration. Superficial.  No pustule or blister. No surrounding erythema.     ED Treatments / Results  Labs (all labs ordered are listed, but only abnormal results are displayed) Labs Reviewed - No data to display  EKG None  Radiology No results found.  Procedures Procedures (including critical care time)  Medications Ordered in ED Medications  neomycin-bacitracin-polymyxin (NEOSPORIN) ointment (has no administration in time range)     Initial Impression / Assessment and Plan / ED Course  I have reviewed the triage vital signs and the nursing notes.  Pertinent labs & imaging results that were available during my care of the patient were reviewed by me and considered in my medical decision making (see chart for details).     Pt with improving (in size) abscess/pustule right medial buttock. No exam findings suggesting need for I&D.  The surface appears abraded which explains the burning pain. Topical abx applied and planned for continues use.  Pt has 4 more days of her abx, advised to complete this course.  Of note, pt denies being allergic to neosporin, uses all the time. Chart corrected by RN.  Final Clinical Impressions(s) / ED Diagnoses   Final diagnoses:  Abscess    ED Discharge Orders    None       Landis Martins 08/30/18 1240    Daleen Bo, MD 08/30/18 [id number]

## 2018-08-30 NOTE — Discharge Instructions (Addendum)
Finish your antibiotics as prescribed by your doctor.  Apply neosporin to the site twice daily.

## 2018-08-30 NOTE — ED Triage Notes (Signed)
Patient states she has abscess on right buttock x 1 week. States she saw PCP on Monday and was given cephalexin but is no better.

## 2019-02-05 ENCOUNTER — Telehealth: Payer: Self-pay

## 2019-03-30 ENCOUNTER — Telehealth: Payer: Self-pay | Admitting: Diagnostic Radiology

## 2019-03-30 NOTE — Progress Notes (Signed)
The patient has not followed up for left diagnostic mammogram/US as recommended.  [name] for patient to call me. Can have it done at [facility].

## 2019-04-06 IMAGING — [facility] DIGITAL DIAGNOSTIC BILATERAL MAMMOGRAM WITH TOMO AND CAD
6 of 10 series · 6 of 30 positions shown · non-contrast
Comparison: 05/07/2016 and earlier

CLINICAL DATA: Palpable abnormality in the LEFT breast. Patient
denies any trauma to the breast.

EXAM:
DIGITAL DIAGNOSTIC BILATERAL MAMMOGRAM WITH CAD AND TOMO
ULTRASOUND LEFT BREAST

[L TAN synth-2D]
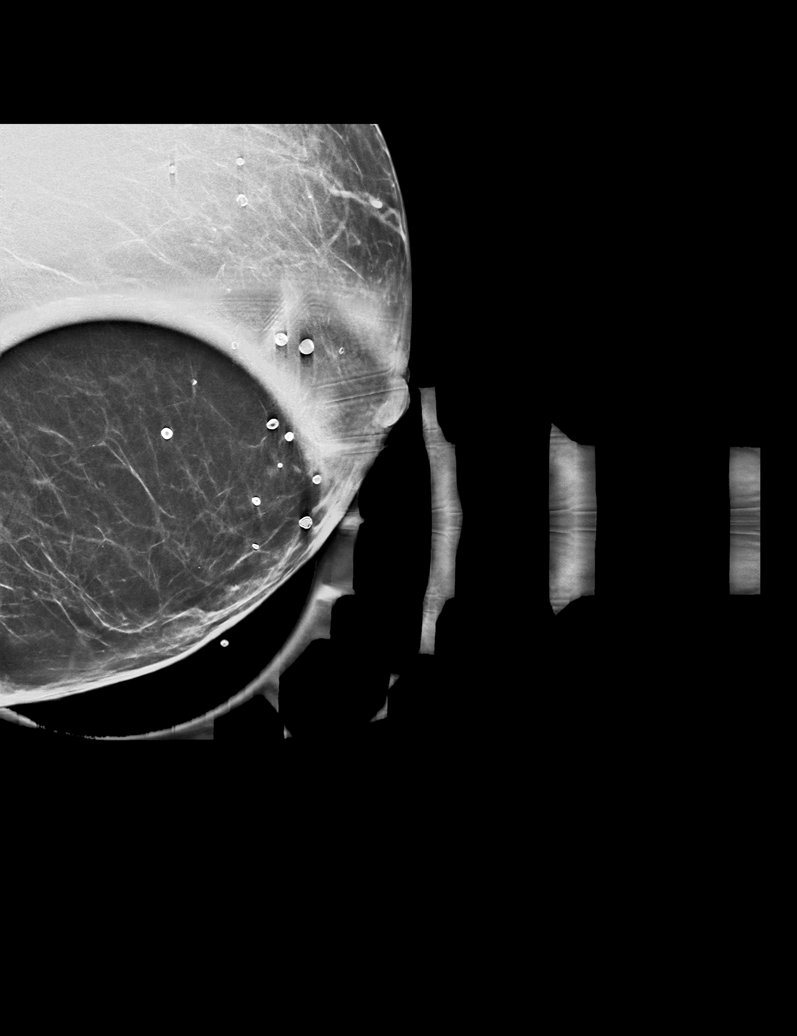

[R MLO synth-2D]
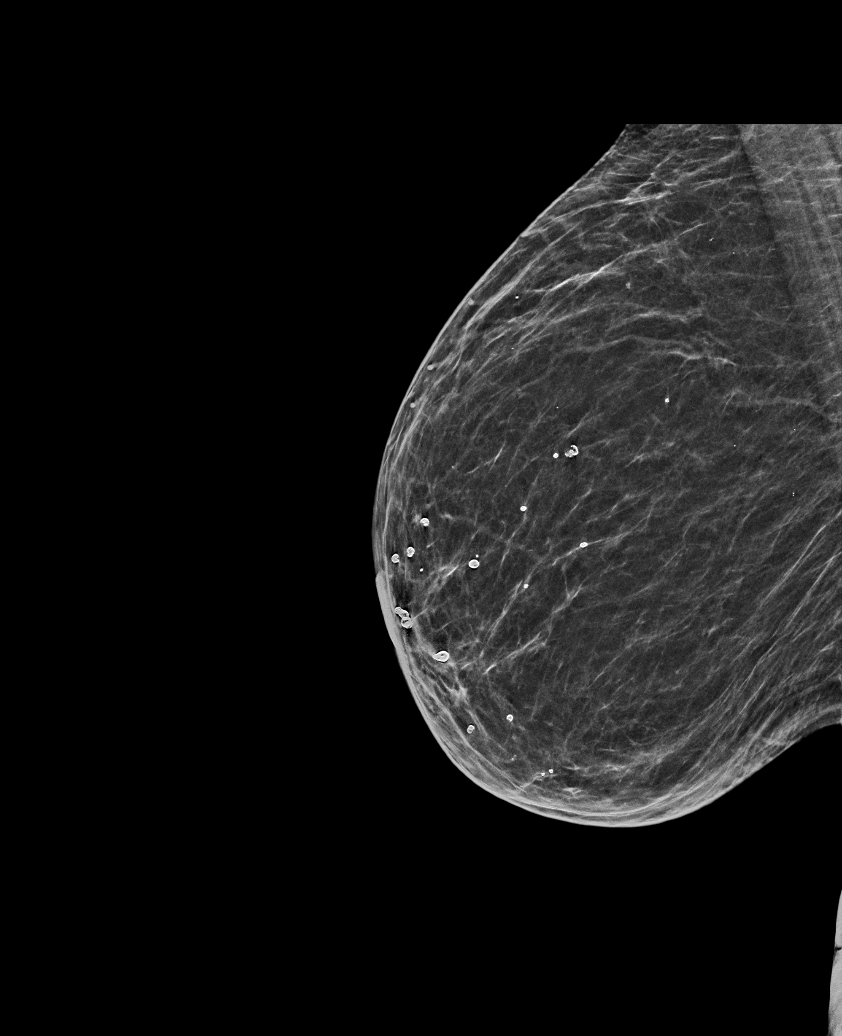

[L MLO synth-2D]
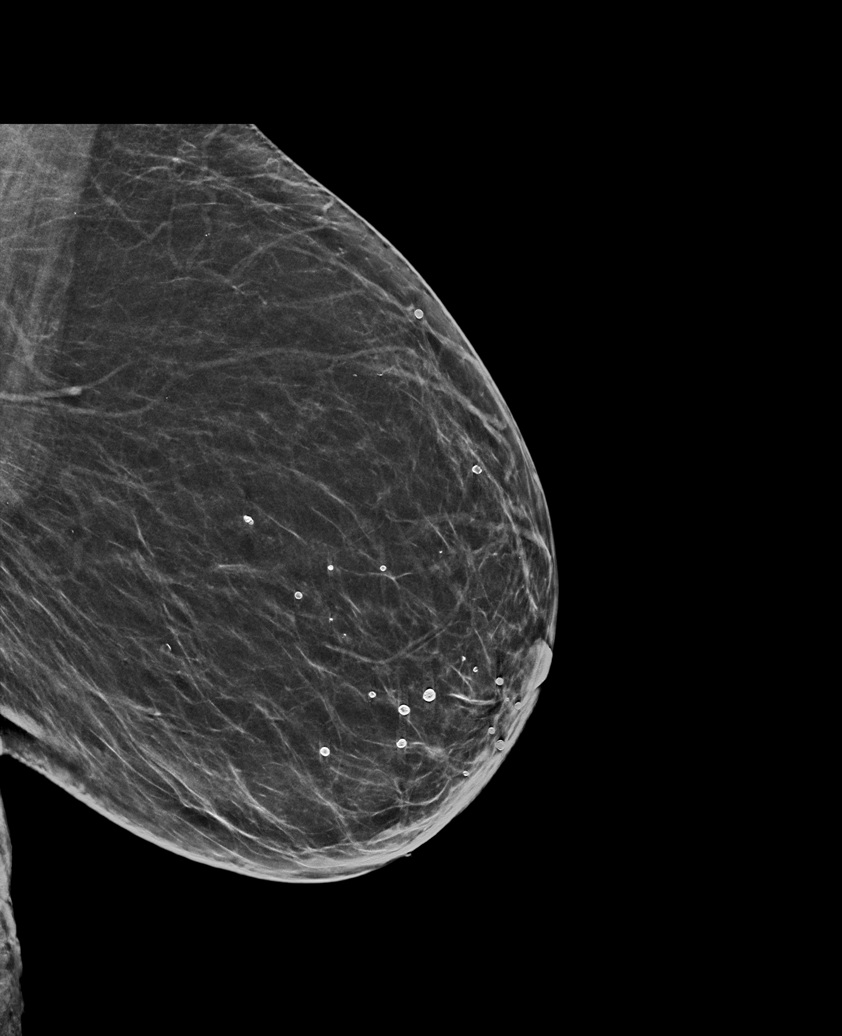

[R CC synth-2D]
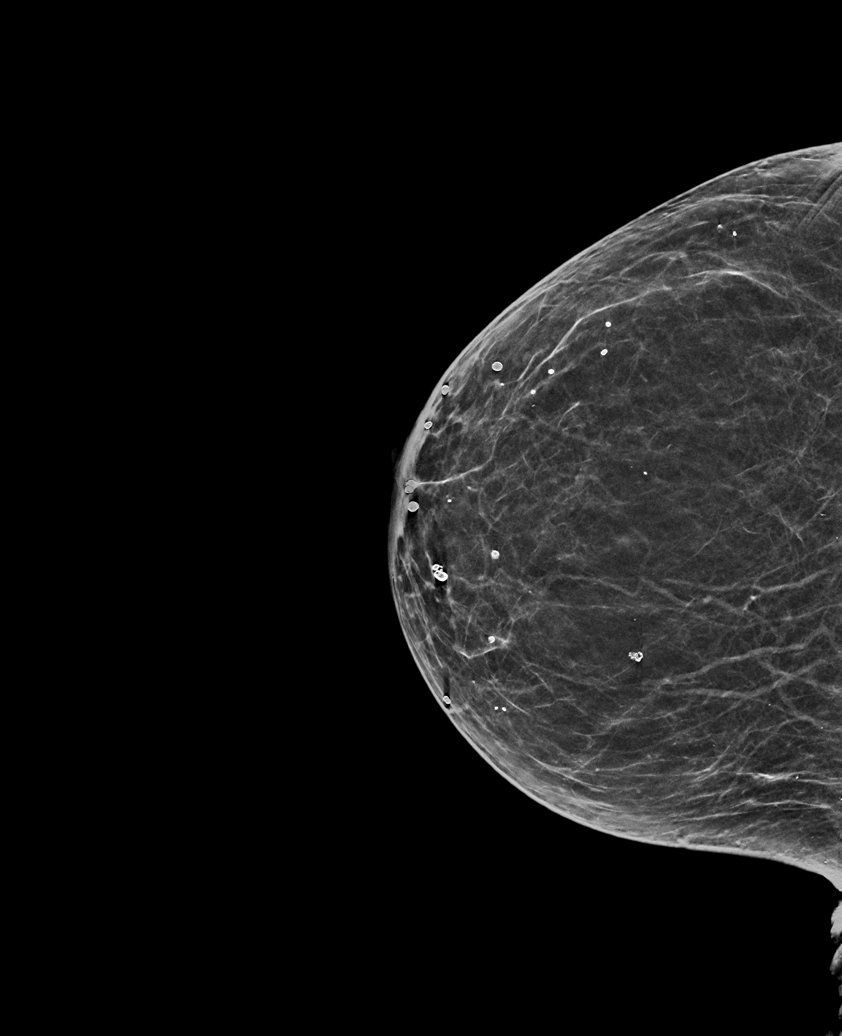

[L CC synth-2D]
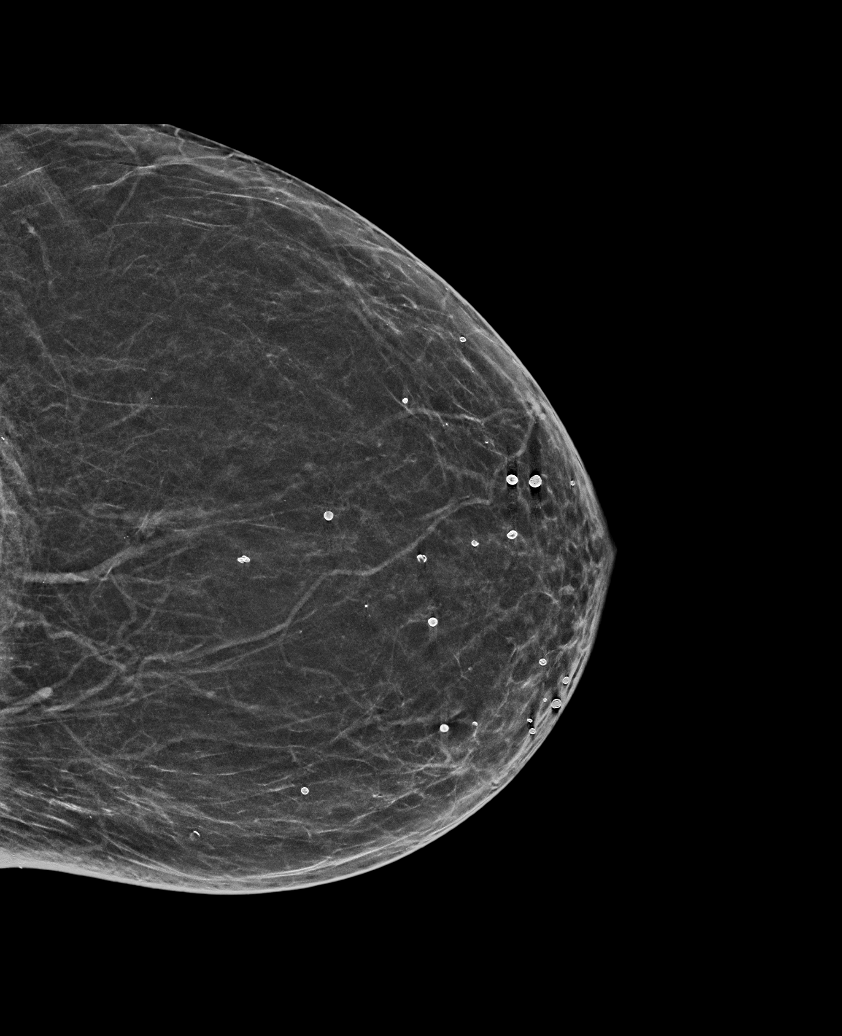

[R CC tomo · tomo slice 27/52.0]
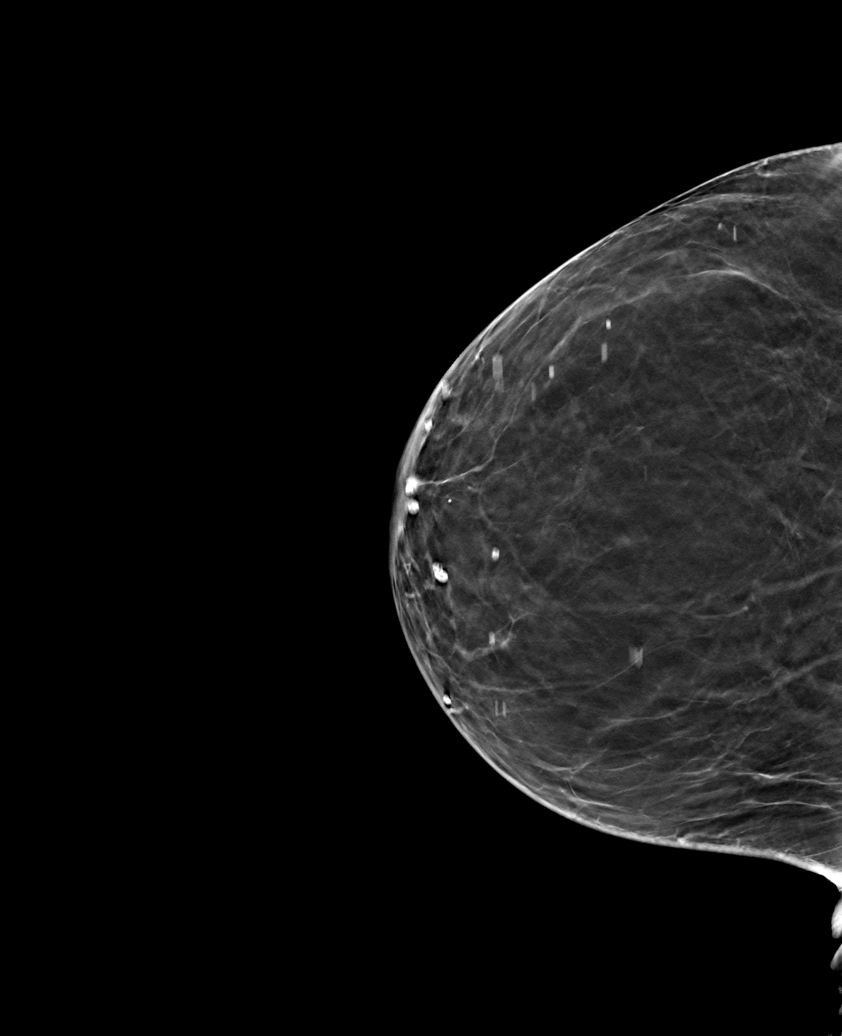

[6 of 30 positions shown; findings below may reference images not displayed]

ACR Breast Density Category b: There are scattered areas of
fibroglandular density.
FINDINGS: Spot tangential view is performed in the area concern in the LOWER
INNER QUADRANT of the LEFT breast. In this region, there is a lucent
oval superficial mass marked with a BB as palpable. Otherwise, no
abnormality identified in either breast.

Mammographic images were processed with CAD.

On physical exam, I palpate a discrete firm mobile slightly tender
mass in the 7 o'clock location of the LEFT breast in the area
concern to the patient. There is no overlying ecchymosis.

Targeted ultrasound is performed, showing a circumscribed
hyperechoic mass in the superficial portion of the LEFT breast 7
o'clock location corresponding to the palpable abnormality. There is
no associated internal blood flow. Mass measures 1.4 x 0.6 x
centimeters. Centrally there is a focal hypoechoic region. Findings
favor benign fat necrosis.
IMPRESSION: 1.  No mammographic or ultrasound evidence for malignancy.
2. Palpable abnormality in the LEFT breast likely represents an area
of fat necrosis. Close followup is recommended to document
appropriate changes and to exclude malignancy. We also discussed the
option of ultrasound-guided core needle biopsy. The patient prefers
a follow-up exam.

RECOMMENDATION:
Followup LEFT breast ultrasound in 6 weeks.

I have discussed the findings and recommendations with the patient.
Results were also provided in writing at the conclusion of the
visit. If applicable, a reminder letter will be sent to the patient
regarding the next appointment.

BI-RADS CATEGORY  3: Probably benign.

## 2019-05-01 ENCOUNTER — Other Ambulatory Visit: Payer: Self-pay

## 2019-05-01 ENCOUNTER — Ambulatory Visit (HOSPITAL_COMMUNITY)
Admission: RE | Admit: 2019-05-01 | Discharge: 2019-05-01 | Disposition: A | Discharge status: Home / Self Care | Payer: Medicare HMO | Source: Ambulatory Visit | Attending: Family Medicine | Admitting: Family Medicine

## 2019-05-01 ENCOUNTER — Other Ambulatory Visit (HOSPITAL_COMMUNITY): Payer: Self-pay | Admitting: Family Medicine

## 2019-05-01 DIAGNOSIS — M79674 Pain in right toe(s): Secondary | ICD-10-CM | POA: Diagnosis present

## 2019-05-01 DIAGNOSIS — T148XXA Other injury of unspecified body region, initial encounter: Secondary | ICD-10-CM | POA: Insufficient documentation

## 2019-05-20 ENCOUNTER — Encounter: Payer: Self-pay | Admitting: Internal Medicine

## 2019-10-27 ENCOUNTER — Telehealth (HOSPITAL_COMMUNITY): Payer: Self-pay

## 2019-10-27 NOTE — Telephone Encounter (Signed)
The above patient or their representative was contacted and gave the following answers to these questions:         Do you have any of the following symptoms?    NO  Fever                    Cough                   Shortness of breath  Do  you have any of the following other symptoms?    muscle pain         vomiting,        diarrhea        rash         weakness        red eye        abdominal pain         bruising          bruising or bleeding              joint pain           severe headache    Have you been in contact with someone who was or has been sick in the past 2 weeks?  NO  Yes                 Unsure                         Unable to assess   Does the person that you were in contact with have any of the following symptoms?   Cough         shortness of breath           muscle pain         vomiting,            diarrhea            rash            weakness           fever            red eye           abdominal pain           bruising  or  bleeding                joint pain                severe headache                 COMMENTS OR ACTION PLAN FOR THIS PATIENT:    ALL QUESTIONS WERE ANSWERED BY PATIENT/[facility]

## 2019-10-29 ENCOUNTER — Encounter (HOSPITAL_COMMUNITY): Payer: Medicare HMO

## 2019-11-03 ENCOUNTER — Other Ambulatory Visit (HOSPITAL_COMMUNITY): Payer: Self-pay | Admitting: Family Medicine

## 2019-11-03 ENCOUNTER — Ambulatory Visit (INDEPENDENT_AMBULATORY_CARE_PROVIDER_SITE_OTHER)
Admission: RE | Admit: 2019-11-03 | Discharge: 2019-11-03 | Disposition: A | Discharge status: Home / Self Care | Payer: Medicare HMO | Source: Ambulatory Visit | Attending: Surgery | Admitting: Surgery

## 2019-11-03 ENCOUNTER — Other Ambulatory Visit: Payer: Self-pay

## 2019-11-03 ENCOUNTER — Ambulatory Visit (HOSPITAL_COMMUNITY)
Admission: RE | Admit: 2019-11-03 | Discharge: 2019-11-03 | Disposition: A | Discharge status: Home / Self Care | Payer: Medicare HMO | Source: Ambulatory Visit | Attending: Surgery | Admitting: Surgery

## 2019-11-03 DIAGNOSIS — I739 Peripheral vascular disease, unspecified: Secondary | ICD-10-CM

## 2019-11-10 ENCOUNTER — Encounter: Payer: Self-pay | Admitting: Vascular Surgery

## 2019-11-10 ENCOUNTER — Other Ambulatory Visit: Payer: Self-pay

## 2019-11-10 ENCOUNTER — Ambulatory Visit (INDEPENDENT_AMBULATORY_CARE_PROVIDER_SITE_OTHER): Payer: Medicare HMO | Admitting: Vascular Surgery

## 2019-11-10 VITALS — BP 129/77 | HR 79 | Temp 97.7°F | Resp 20 | Ht 66.0 in | Wt 172.0 lb

## 2019-11-10 DIAGNOSIS — I70213 Atherosclerosis of native arteries of extremities with intermittent claudication, bilateral legs: Secondary | ICD-10-CM | POA: Diagnosis not present

## 2019-11-10 MED ORDER — CILOSTAZOL 100 MG PO TABS: 100.0000 mg | ORAL_TABLET | Freq: Two times a day (BID) | ORAL | 11 refills | Status: DC

## 2019-11-10 NOTE — Progress Notes (Signed)
[facility] [place]  Patient name: [name] MRN: [id number] DOB: 1951/04/03 Sex: female  REASON FOR CONSULT: Evaluation bilateral lower extremity discomfort from intermittent claudication  HPI: [name] is a 69 y.o. female, who is here today for discussion of bilateral lower extremity arterial insufficiency.  She is a very pleasant active 68 year old who reports limiting claudication in both lower extremities.  This is worse in her right leg than her left.  She describes that walking in her neighborhood or attempting to mow her grass is very difficult for her.  This requires her to sit for resting and then resume.  This is in both calves and is more so in her right than her left.  She has no history of tissue loss in her lower extremities.  No arterial rest pain.  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  Does have history of coronary artery disease with stenting in 2000.  She is a diabetic and does have hypertension.  She was a long-term smoker and quit 3 years ago.  Past Medical History:  Diagnosis Date  . Allergic rhinitis   . 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 ASCVD (arteriosclerotic cardiovascular disease)    MI in 96 requiring BMS CX; DES to M1 in 2000;normal coronary angiography in 2004  . Cholelithiasis   . COPD (chronic obstructive pulmonary disease) ([place])   . DDD (degenerative disc disease), lumbar   . Depression   . Diabetes mellitus   . Diabetes mellitus type I ([place])   . DVT (deep venous thrombosis) ([place])   . GERD (gastroesophageal reflux disease)   . Hyperlipidemia   . Hypothyroidism   . Low back pain   . Nephrolithiasis 2006   stone extraction   . Peripheral neuropathy   . Sciatic pain    right  . Tobacco abuse   . Tremor     Family History  Problem Relation Age of Onset  . Depression Mother   . Bipolar  disorder Mother   . Dementia Mother   . Pulmonary fibrosis Mother   . Alcohol abuse Father   . Aneurysm Father        deceased age 41, brain  . Colon cancer Paternal Grandfather        age greater than 47    SOCIAL HISTORY: Social History   Socioeconomic History  . Marital status: Legally Separated    Spouse name: Not on file  . Number of children: Not on file  . Years of education: Not on file  . Highest education level: Not on file  Occupational History  . 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 Smoking status: Former Smoker    Packs/day: 0.50    Years: 40.00    Pack years: 20.00    Types: Cigarettes    Quit date: 08/30/2017    Years since quitting: 2.1  . Smokeless tobacco: Never Used  . Tobacco comment: 1/2 pack per day  Substance and Sexual Activity  . Alcohol use: No  . Drug use: No  . Sexual activity: Never  Other Topics Concern  . Not on file  Social History Narrative   Married x3   Lives with husband   GED in 2005   Social Determinants of Health   Financial Resource Strain:   .  Difficulty of Paying Living Expenses: Not on file  Food Insecurity:   . Worried About Charity fundraiser in the Last Year: Not on file  . Ran Out of Food in the Last Year: Not on file  Transportation Needs:   . Lack of Transportation (Medical): Not on file  . Lack of Transportation (Non-Medical): Not on file  Physical Activity:   . Days of Exercise per Week: Not on file  . Minutes of Exercise per Session: Not on file  Stress:   . Feeling of Stress : Not on file  Social Connections:   . Frequency of Communication with Friends and Family: Not on file  . Frequency of Social Gatherings with Friends and Family: Not on file  . Attends Religious Services: Not on file  . Active Member of Clubs or Organizations: Not on file  . Attends Archivist Meetings: Not on file  . Marital Status: Not on file   Intimate Partner Violence:   . Fear of Current or Ex-Partner: Not on file  . Emotionally Abused: Not on file  . Physically Abused: Not on file  . Sexually Abused: Not on file    Allergies  Allergen Reactions  . Iohexol      Desc: CHEST TIGHTNESS,BRETHING PROBLEMS NEEDS PRE MEDS   . Tape Other (See Comments)    Tears skin  . Ciprofloxacin Rash  . Latex Rash    GLOVES   . Penicillins Rash  . Povidone-Iodine Rash    Current Outpatient Medications  Medication Sig Dispense Refill  . albuterol (PROVENTIL HFA;VENTOLIN HFA) 108 (90 BASE) MCG/ACT inhaler Inhale 2 puffs into the lungs every 4 (four) hours as needed for wheezing or shortness of breath. 1 Inhaler 0  . clonazePAM (KLONOPIN) 0.5 MG tablet Take 1 tablet by mouth at bedtime.    Marland Kitchen FARXIGA 10 MG TABS tablet     . LANTUS SOLOSTAR 100 UNIT/ML Solostar Pen     . levothyroxine (SYNTHROID, LEVOTHROID) 200 MCG tablet Take 200 mcg by mouth daily before breakfast.    . lisinopril (PRINIVIL,ZESTRIL) 20 MG tablet Take 20 mg by mouth daily.      Marland Kitchen LORazepam (ATIVAN) 1 MG tablet     . metFORMIN (GLUCOPHAGE) 500 MG tablet     . omeprazole (PRILOSEC) 20 MG capsule Take 1 capsule by mouth daily.    Marland Kitchen OVER THE COUNTER MEDICATION Place 2 sprays into the nose 2 (two) times daily as needed (for congestion). 4 Way Nasal Spray    . oxyCODONE-acetaminophen (PERCOCET/ROXICET) 5-325 MG tablet Take 1-2 tablets by mouth every 4 (four) hours as needed for severe pain. 12 tablet 0  . PARoxetine (PAXIL) 20 MG tablet     . pravastatin (PRAVACHOL) 40 MG tablet Take 40 mg by mouth daily.      . pregabalin (LYRICA) 75 MG capsule     . traZODone (DESYREL) 100 MG tablet Take 1 tablet by mouth at bedtime.    . cilostazol (PLETAL) 100 MG tablet Take 1 tablet (100 mg total) by mouth 2 (two) times daily before a meal. 60 tablet 11  . citalopram (CELEXA) 20 MG tablet Take 1 tablet by mouth daily.     No current facility-administered medications for this visit.     REVIEW OF SYSTEMS:  [X]  denotes positive finding, [ ]  denotes negative finding Cardiac  Comments:  Chest pain or chest pressure:    Shortness of breath upon exertion:    Short of breath when lying flat:  Irregular heart rhythm:        Vascular    Pain in calf, thigh, or hip brought on by ambulation: x   Pain in feet at night that wakes you up from your sleep:     Blood clot in your veins:    Leg swelling:         Pulmonary    Oxygen at home:    Productive cough:     Wheezing:  x       Neurologic    Sudden weakness in arms or legs:  x   Sudden numbness in arms or legs:     Sudden onset of difficulty speaking or slurred speech:    Temporary loss of vision in one eye:     Problems with dizziness:  x       Gastrointestinal    Blood in stool:     Vomited blood:         Genitourinary    Burning when urinating:     Blood in urine:        Psychiatric    Major depression:         Hematologic    Bleeding problems:    Problems with blood clotting too easily:        Skin    Rashes or ulcers:        Constitutional    Fever or chills:      PHYSICAL EXAM: Vitals:   11/10/19 1236  BP: 129/77  Pulse: 79  Resp: 20  Temp: 97.7 F (36.5 C)  SpO2: 94%  Weight: 172 lb (78 kg)  Height: 5' 6"  (1.676 m)    GENERAL: The patient is a well-nourished female, in no acute distress. The vital signs are documented above. CARDIOVASCULAR: Carotid arteries without bruits bilaterally.  2+ radial pulses bilaterally.  Palpable femoral pulses bilaterally.  She does have a 1+ right dorsalis pedis and 1+ left posterior tibial pulse. PULMONARY: There is good air exchange  ABDOMEN: Soft and non-tender  MUSCULOSKELETAL: There are no major deformities or cyanosis.  Right hand amputation NEUROLOGIC: No focal weakness or paresthesias are detected. SKIN: There are no ulcers or rashes noted. PSYCHIATRIC: The patient has a normal affect.  DATA:  Noninvasive studies reveal ankle arm index of  0.71 on the right and 0.82 on the left with monophasic flow bilaterally.  Duplex imaging shows stenosis in her superficial femoral arteries bilaterally  MEDICAL ISSUES: I had a long discussion with the patient.  She does have bilateral limiting claudication right greater than left.  I explained the option of arteriography with angioplasty and possible stenting of her superficial femoral arteries.  I explained that there is extremely high incidence of Cecelia Graciano recurrence with this and would recommend against this currently.  Also explained the option of femoral to popliteal bypass grafting and would recommend against this due to the magnitude of surgery.  I reassured her that she is not causing any harm in her walking program and encouraged her to continue that.  Also discussed the option of Pletal for symptomatic relief of her intermittent claudication.  I did explain that this occasionally does give selected patients good relief and in fact called in a prescription for her.  Recommend that she try this for several months to determine if subjectively she is walking better.  I will see her again in 3 months for continued discussion.  She knows to notify should she develop any tissue loss   Rosetta Posner,  MD FACS [facility] Tel [phone] Pager [phone]

## 2019-12-15 ENCOUNTER — Ambulatory Visit: Payer: Medicare HMO | Attending: Internal Medicine

## 2019-12-15 ENCOUNTER — Other Ambulatory Visit: Payer: Self-pay

## 2019-12-15 DIAGNOSIS — Z23 Encounter for immunization: Secondary | ICD-10-CM | POA: Insufficient documentation

## 2019-12-19 ENCOUNTER — Encounter (HOSPITAL_COMMUNITY): Payer: Self-pay | Admitting: Emergency Medicine

## 2019-12-19 ENCOUNTER — Emergency Department (HOSPITAL_COMMUNITY): Payer: Medicare HMO

## 2019-12-19 ENCOUNTER — Ambulatory Visit
Admission: EM | Admit: 2019-12-19 | Discharge: 2019-12-19 | Disposition: A | Discharge status: Home / Self Care | Payer: Medicare HMO | Source: Home / Self Care

## 2019-12-19 ENCOUNTER — Inpatient Hospital Stay (HOSPITAL_COMMUNITY)
Admission: EM | Admit: 2019-12-19 | Discharge: 2019-12-24 | DRG: 280 | Disposition: A | Discharge status: Home / Self Care | Payer: Medicare HMO | Attending: Internal Medicine | Admitting: Internal Medicine

## 2019-12-19 ENCOUNTER — Other Ambulatory Visit: Payer: Self-pay

## 2019-12-19 DIAGNOSIS — Z86718 Personal history of other venous thrombosis and embolism: Secondary | ICD-10-CM

## 2019-12-19 DIAGNOSIS — I214 Non-ST elevation (NSTEMI) myocardial infarction: Secondary | ICD-10-CM

## 2019-12-19 DIAGNOSIS — K2970 Gastritis, unspecified, without bleeding: Secondary | ICD-10-CM | POA: Diagnosis present

## 2019-12-19 DIAGNOSIS — I340 Nonrheumatic mitral (valve) insufficiency: Secondary | ICD-10-CM | POA: Diagnosis present

## 2019-12-19 DIAGNOSIS — K219 Gastro-esophageal reflux disease without esophagitis: Secondary | ICD-10-CM | POA: Diagnosis present

## 2019-12-19 DIAGNOSIS — I951 Orthostatic hypotension: Secondary | ICD-10-CM | POA: Diagnosis not present

## 2019-12-19 DIAGNOSIS — Z91048 Other nonmedicinal substance allergy status: Secondary | ICD-10-CM

## 2019-12-19 DIAGNOSIS — E875 Hyperkalemia: Secondary | ICD-10-CM | POA: Diagnosis present

## 2019-12-19 DIAGNOSIS — J441 Chronic obstructive pulmonary disease with (acute) exacerbation: Secondary | ICD-10-CM

## 2019-12-19 DIAGNOSIS — Z90722 Acquired absence of ovaries, bilateral: Secondary | ICD-10-CM

## 2019-12-19 DIAGNOSIS — I130 Hypertensive heart and chronic kidney disease with heart failure and stage 1 through stage 4 chronic kidney disease, or unspecified chronic kidney disease: Secondary | ICD-10-CM | POA: Diagnosis not present

## 2019-12-19 DIAGNOSIS — Z811 Family history of alcohol abuse and dependence: Secondary | ICD-10-CM

## 2019-12-19 DIAGNOSIS — N183 Chronic kidney disease, stage 3 unspecified: Secondary | ICD-10-CM

## 2019-12-19 DIAGNOSIS — R0602 Shortness of breath: Secondary | ICD-10-CM

## 2019-12-19 DIAGNOSIS — E1122 Type 2 diabetes mellitus with diabetic chronic kidney disease: Secondary | ICD-10-CM | POA: Diagnosis present

## 2019-12-19 DIAGNOSIS — Z880 Allergy status to penicillin: Secondary | ICD-10-CM

## 2019-12-19 DIAGNOSIS — I21A1 Myocardial infarction type 2: Secondary | ICD-10-CM | POA: Diagnosis present

## 2019-12-19 DIAGNOSIS — N1831 Chronic kidney disease, stage 3a: Secondary | ICD-10-CM | POA: Diagnosis present

## 2019-12-19 DIAGNOSIS — Z87442 Personal history of urinary calculi: Secondary | ICD-10-CM

## 2019-12-19 DIAGNOSIS — Z89111 Acquired absence of right hand: Secondary | ICD-10-CM

## 2019-12-19 DIAGNOSIS — F329 Major depressive disorder, single episode, unspecified: Secondary | ICD-10-CM | POA: Diagnosis present

## 2019-12-19 DIAGNOSIS — I2510 Atherosclerotic heart disease of native coronary artery without angina pectoris: Secondary | ICD-10-CM

## 2019-12-19 DIAGNOSIS — Z79891 Long term (current) use of opiate analgesic: Secondary | ICD-10-CM

## 2019-12-19 DIAGNOSIS — E669 Obesity, unspecified: Secondary | ICD-10-CM | POA: Diagnosis present

## 2019-12-19 DIAGNOSIS — E039 Hypothyroidism, unspecified: Secondary | ICD-10-CM | POA: Diagnosis present

## 2019-12-19 DIAGNOSIS — E1142 Type 2 diabetes mellitus with diabetic polyneuropathy: Secondary | ICD-10-CM

## 2019-12-19 DIAGNOSIS — D649 Anemia, unspecified: Secondary | ICD-10-CM

## 2019-12-19 DIAGNOSIS — D1779 Benign lipomatous neoplasm of other sites: Secondary | ICD-10-CM | POA: Diagnosis present

## 2019-12-19 DIAGNOSIS — Z20822 Contact with and (suspected) exposure to covid-19: Secondary | ICD-10-CM | POA: Diagnosis present

## 2019-12-19 DIAGNOSIS — Z6829 Body mass index (BMI) 29.0-29.9, adult: Secondary | ICD-10-CM

## 2019-12-19 DIAGNOSIS — J189 Pneumonia, unspecified organism: Secondary | ICD-10-CM | POA: Diagnosis present

## 2019-12-19 DIAGNOSIS — Z800 Family history of malignant neoplasm of digestive organs: Secondary | ICD-10-CM

## 2019-12-19 DIAGNOSIS — E119 Type 2 diabetes mellitus without complications: Secondary | ICD-10-CM

## 2019-12-19 DIAGNOSIS — I5031 Acute diastolic (congestive) heart failure: Secondary | ICD-10-CM | POA: Diagnosis present

## 2019-12-19 DIAGNOSIS — N179 Acute kidney failure, unspecified: Secondary | ICD-10-CM

## 2019-12-19 DIAGNOSIS — Z818 Family history of other mental and behavioral disorders: Secondary | ICD-10-CM

## 2019-12-19 DIAGNOSIS — E782 Mixed hyperlipidemia: Secondary | ICD-10-CM

## 2019-12-19 DIAGNOSIS — I509 Heart failure, unspecified: Secondary | ICD-10-CM

## 2019-12-19 DIAGNOSIS — Z955 Presence of coronary angioplasty implant and graft: Secondary | ICD-10-CM

## 2019-12-19 DIAGNOSIS — I25110 Atherosclerotic heart disease of native coronary artery with unstable angina pectoris: Secondary | ICD-10-CM | POA: Diagnosis present

## 2019-12-19 DIAGNOSIS — Z79899 Other long term (current) drug therapy: Secondary | ICD-10-CM

## 2019-12-19 DIAGNOSIS — F419 Anxiety disorder, unspecified: Secondary | ICD-10-CM | POA: Diagnosis present

## 2019-12-19 DIAGNOSIS — Z90710 Acquired absence of both cervix and uterus: Secondary | ICD-10-CM

## 2019-12-19 DIAGNOSIS — Z9049 Acquired absence of other specified parts of digestive tract: Secondary | ICD-10-CM

## 2019-12-19 DIAGNOSIS — Z794 Long term (current) use of insulin: Secondary | ICD-10-CM

## 2019-12-19 DIAGNOSIS — K5520 Angiodysplasia of colon without hemorrhage: Secondary | ICD-10-CM | POA: Diagnosis present

## 2019-12-19 DIAGNOSIS — R0600 Dyspnea, unspecified: Secondary | ICD-10-CM

## 2019-12-19 DIAGNOSIS — Z91040 Latex allergy status: Secondary | ICD-10-CM

## 2019-12-19 DIAGNOSIS — I129 Hypertensive chronic kidney disease with stage 1 through stage 4 chronic kidney disease, or unspecified chronic kidney disease: Secondary | ICD-10-CM

## 2019-12-19 DIAGNOSIS — Z87891 Personal history of nicotine dependence: Secondary | ICD-10-CM

## 2019-12-19 DIAGNOSIS — K7460 Unspecified cirrhosis of liver: Secondary | ICD-10-CM | POA: Diagnosis present

## 2019-12-19 DIAGNOSIS — Z888 Allergy status to other drugs, medicaments and biological substances status: Secondary | ICD-10-CM

## 2019-12-19 DIAGNOSIS — D509 Iron deficiency anemia, unspecified: Secondary | ICD-10-CM | POA: Diagnosis present

## 2019-12-19 DIAGNOSIS — J9601 Acute respiratory failure with hypoxia: Secondary | ICD-10-CM | POA: Diagnosis present

## 2019-12-19 DIAGNOSIS — K760 Fatty (change of) liver, not elsewhere classified: Secondary | ICD-10-CM | POA: Diagnosis present

## 2019-12-19 DIAGNOSIS — Z79890 Hormone replacement therapy (postmenopausal): Secondary | ICD-10-CM

## 2019-12-19 DIAGNOSIS — J440 Chronic obstructive pulmonary disease with acute lower respiratory infection: Secondary | ICD-10-CM | POA: Diagnosis present

## 2019-12-19 DIAGNOSIS — R079 Chest pain, unspecified: Secondary | ICD-10-CM | POA: Diagnosis present

## 2019-12-19 DIAGNOSIS — I2584 Coronary atherosclerosis due to calcified coronary lesion: Secondary | ICD-10-CM | POA: Diagnosis present

## 2019-12-19 DIAGNOSIS — I252 Old myocardial infarction: Secondary | ICD-10-CM

## 2019-12-19 LAB — CBC WITH DIFFERENTIAL/PLATELET
Abs Immature Granulocytes: 0.02 10*3/uL (ref 0.00–0.07)
Basophils Absolute: 0 10*3/uL (ref 0.0–0.1)
Basophils Relative: 0 %
Eosinophils Absolute: 0.2 10*3/uL (ref 0.0–0.5)
Eosinophils Relative: 3 %
HCT: 27.8 % — ABNORMAL LOW (ref 36.0–46.0)
Hemoglobin: 8.1 g/dL — ABNORMAL LOW (ref 12.0–15.0)
Immature Granulocytes: 0 %
Lymphocytes Relative: 30 %
Lymphs Abs: 2 10*3/uL (ref 0.7–4.0)
MCH: 26 pg (ref 26.0–34.0)
MCHC: 29.1 g/dL — ABNORMAL LOW (ref 30.0–36.0)
MCV: 89.1 fL (ref 80.0–100.0)
Monocytes Absolute: 0.5 10*3/uL (ref 0.1–1.0)
Monocytes Relative: 7 %
Neutro Abs: 3.9 10*3/uL (ref 1.7–7.7)
Neutrophils Relative %: 60 %
Platelets: 246 10*3/uL (ref 150–400)
RBC: 3.12 MIL/uL — ABNORMAL LOW (ref 3.87–5.11)
RDW: 16.7 % — ABNORMAL HIGH (ref 11.5–15.5)
WBC: 6.5 10*3/uL (ref 4.0–10.5)
nRBC: 0 % (ref 0.0–0.2)

## 2019-12-19 LAB — TROPONIN I (HIGH SENSITIVITY)
Troponin I (High Sensitivity): 1593 ng/L (ref <18)
Troponin I (High Sensitivity): 1584 ng/L (ref <18)

## 2019-12-19 LAB — D-DIMER, QUANTITATIVE: D-Dimer, Quant: 1.18 ug/mL-FEU — ABNORMAL HIGH (ref 0.00–0.50)

## 2019-12-19 LAB — COMPREHENSIVE METABOLIC PANEL
ALT: 13 U/L (ref 0–44)
AST: 15 U/L (ref 15–41)
Albumin: 3.6 g/dL (ref 3.5–5.0)
Alkaline Phosphatase: 61 U/L (ref 38–126)
Anion gap: 7 (ref 5–15)
BUN: 19 mg/dL (ref 8–23)
CO2: 24 mmol/L (ref 22–32)
Calcium: 9.2 mg/dL (ref 8.9–10.3)
Chloride: 102 mmol/L (ref 98–111)
Creatinine, Ser: 1.23 mg/dL — ABNORMAL HIGH (ref 0.44–1.00)
GFR calc Af Amer: 52 mL/min — ABNORMAL LOW (ref >60)
GFR calc non Af Amer: 45 mL/min — ABNORMAL LOW (ref >60)
Glucose, Bld: 123 mg/dL — ABNORMAL HIGH (ref 70–99)
Potassium: 5.2 mmol/L — ABNORMAL HIGH (ref 3.5–5.1)
Sodium: 133 mmol/L — ABNORMAL LOW (ref 135–145)
Total Bilirubin: 0.5 mg/dL (ref 0.3–1.2)
Total Protein: 7.5 g/dL (ref 6.5–8.1)

## 2019-12-19 LAB — BRAIN NATRIURETIC PEPTIDE: B Natriuretic Peptide: 417 pg/mL — ABNORMAL HIGH (ref 0.0–100.0)

## 2019-12-19 LAB — CBG MONITORING, ED: Glucose-Capillary: 121 mg/dL — ABNORMAL HIGH (ref 70–99)

## 2019-12-19 LAB — POC SARS CORONAVIRUS 2 AG -  ED: SARS Coronavirus 2 Ag: NEGATIVE

## 2019-12-19 MED ORDER — DIPHENHYDRAMINE HCL 25 MG PO CAPS
50.0000 mg | ORAL_CAPSULE | Freq: Once | ORAL | Status: AC
  Administered 2019-12-19: 50 mg via ORAL
  Filled 2019-12-19: qty 2

## 2019-12-19 MED ORDER — ASPIRIN 81 MG PO CHEW
324.0000 mg | CHEWABLE_TABLET | Freq: Once | ORAL | Status: AC
  Administered 2019-12-19: 324 mg via ORAL
  Filled 2019-12-19: qty 4

## 2019-12-19 MED ORDER — IOHEXOL 350 MG/ML SOLN
75.0000 mL | Freq: Once | INTRAVENOUS | Status: AC | PRN
  Administered 2019-12-19: 75 mL via INTRAVENOUS

## 2019-12-19 MED ORDER — AEROCHAMBER PLUS FLO-VU MEDIUM MISC
1.0000 | Freq: Once | Status: AC
  Administered 2019-12-19: 1
  Filled 2019-12-19 (×2): qty 1

## 2019-12-19 MED ORDER — DIPHENHYDRAMINE HCL 50 MG/ML IJ SOLN
50.0000 mg | Freq: Once | INTRAMUSCULAR | Status: AC
  Filled 2019-12-19: qty 1

## 2019-12-19 MED ORDER — SODIUM CHLORIDE 0.9 % IV SOLN
1.0000 g | Freq: Once | INTRAVENOUS | Status: AC
  Administered 2019-12-19: 1 g via INTRAVENOUS
  Filled 2019-12-19: qty 10

## 2019-12-19 MED ORDER — HYDROCORTISONE NA SUCCINATE PF 250 MG IJ SOLR
200.0000 mg | Freq: Once | INTRAMUSCULAR | Status: AC
  Administered 2019-12-19: 200 mg via INTRAVENOUS
  Filled 2019-12-19: qty 200

## 2019-12-19 MED ORDER — HYDROCORTISONE NA SUCCINATE PF 250 MG IJ SOLR: 200.0000 mg | Freq: Once | INTRAMUSCULAR | Status: DC

## 2019-12-19 MED ORDER — SODIUM CHLORIDE 0.9 % IV SOLN
500.0000 mg | Freq: Once | INTRAVENOUS | Status: AC
  Administered 2019-12-19: 500 mg via INTRAVENOUS
  Filled 2019-12-19: qty 500

## 2019-12-19 MED ORDER — ALBUTEROL SULFATE HFA 108 (90 BASE) MCG/ACT IN AERS
4.0000 | INHALATION_SPRAY | Freq: Once | RESPIRATORY_TRACT | Status: AC
  Administered 2019-12-19: 4 via RESPIRATORY_TRACT
  Filled 2019-12-19: qty 6.7

## 2019-12-19 MED ORDER — HYDROCORTISONE NA SUCCINATE PF 250 MG IJ SOLR
200.0000 mg | Freq: Once | INTRAMUSCULAR | Status: DC
  Filled 2019-12-19: qty 200

## 2019-12-19 NOTE — ED Provider Notes (Signed)
Medical screening examination/treatment/procedure(s) were conducted as a shared visit with non-physician practitioner(s) and myself.  I personally evaluated the patient during the encounter.  EKG Interpretation  Date/Time:  Saturday December 19 2019 17:07:30 EST Ventricular Rate:  74 PR Interval:    QRS Duration: 95 QT Interval:  367 QTC Calculation: 408 R Axis:   5 Text Interpretation: Sinus rhythm Low voltage, precordial leads Abnormal T, consider ischemia, lateral leads Confirmed by Fredia Sorrow [phone]) on 12/19/2019 5:21:17 PM   Patient seen by me along with the physician assistant.  Patient's past medical history is significant for coronary artery disease patient presenting with complaint of shortness of breath O2 sats were about 88% on room air.  She reports 68% at home.  Also patient with orthostatic blood pressure drop.  Besides the history of coronary disease patient has COPD.  Hyperlipidemia.  Diabetes type 1.  In addition patient also has a past history of deep vein thrombosis.  Patient last seen for a breathing problem in December 2016 which is COPD exacerbation.  Patient does have some chest discomfort with it.  But she does not like her previous MI.  Here in the bed patient oxygen saturations on oxygen 2 L is fine.  Covid testing was negative no leukocytosis.  Hemoglobin is down at 8.1.  Electrolytes significant for potassium of 5.2.  Creatinine 1.23.  D-dimer elevated to 1.18.  Raising concerns for pulmonary embolus or DVT.  BNP up a little bit at 417.  Troponin markedly elevated at 1593.  Patient's EKG does not show any evidence of STEMI.  Troponin raise concern for a non-STEMI.  Patient will require admission for this.  Based on the D-dimer does warrant CT study.  But she has a dye sensitivity.  We had already ordered a plain CT to see what results we get.  Before we knew about the D-dimer.  But patient was notified that she may require CT scan with contrast to rule out a PE as well.   Patient will require admission.  Patient very stable in bed.  Oxygen saturations good blood pressure good.  Patient no acute distress.   Patient has been preloaded with steroids in preparation for the CT angio.  The dye allergy premedication order set has been placed.    Fredia Sorrow, MD 12/19/19 1820

## 2019-12-19 NOTE — ED Triage Notes (Signed)
Pt arrived with sob and O2 sat 88 %, reported 68% at home, seen by provider and determined to need higher level of care in ED

## 2019-12-19 NOTE — ED Triage Notes (Signed)
Shortness of breath and dizziness since Tuesday

## 2019-12-19 NOTE — ED Provider Notes (Addendum)
[facility] Provider Note   CSN: [id number] Arrival date & time: 12/19/19  1606     History Chief Complaint  Patient presents with  . Shortness of Breath    [name] is a 69 y.o. female with a history of COPD, diabetes mellitus, hyperlipidemia, hypothyroidism, prior DVT not on anticoagulation, anxiety, depression, EtOH abuse, & IBS who presents to the ED from urgent care with complaints of dyspnea x 3 weeks, worsening over the past 1 week significantly. Patient states that dyspnea is primarily with exertion, alleviated with rest, with this she has associated dry cough, wheezing, and R sided chest pain. Chest pain is worse with coughing, no other alleviating/aggravating factors. She states that sxs are progressively worsening, seems short of breath with very minimal activity at this point. She also mentions she feels lightheaded as if she may pass out whenever she transitions from sitting to standing or walks around, denies syncope. She has bilateral LE pain/weakness for awhile now, seeing vascular surgery, no acute change. Denies fever, chills, diaphoresis, nausea, vomiting, abdominal pain, leg swelling, hemoptysis, recent surgery/trauma, recent long travel, hormone use, or personal hx of cancer. Patient does not recall a DVT but one is documented in her chart from 2008. She has never been able to sleep on her back in regards to orthopnea, this is not new.    HPI     Past Medical History:  Diagnosis Date  . Allergic rhinitis   . 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 ASCVD (arteriosclerotic cardiovascular disease)    MI in 96 requiring BMS CX; DES to M1 in 2000;normal coronary angiography in 2004  . Cholelithiasis   . COPD (chronic obstructive pulmonary disease) ([place])   . DDD (degenerative disc disease), lumbar   . Depression   . Diabetes mellitus   . Diabetes mellitus type I ([place])   . DVT (deep venous thrombosis) ([place])   . GERD (gastroesophageal  reflux disease)   . Hyperlipidemia   . Hypothyroidism   . Low back pain   . Nephrolithiasis 2006   stone extraction   . Peripheral neuropathy   . Sciatic pain    right  . Tobacco abuse   . Tremor     Patient Active Problem List   Diagnosis Date Noted  . Weakness 09/04/2014  . Dizziness 09/04/2014  . Hepatomegaly 03/31/2014  . Esophageal dysphagia 03/31/2014  . Unspecified constipation 03/31/2014  . Acute renal failure ([place]) 10/30/2013  . Altered mental status 09/28/2013  . ETOH abuse 09/28/2013  . Altered mental state 09/28/2013  . Hyperkalemia 06/05/2013  . Bradycardia 06/05/2013  . Edema 06/02/2013  . Hyponatremia 06/02/2013  . Pneumonia due to infectious agent 12/26/2012  . Insomnia due to mental disorder 09/02/2012  . RECTAL PAIN 09/04/2010  . CHEST PAIN 08/22/2010  . Diabetes mellitus ([place]) 06/16/2010  . Cardiovascular disease 06/16/2010  . MICROALBUMINURIA 06/01/2009  . 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 OTHER DYSPHAGIA 01/05/2009  . COLONIC POLYPS, ADENOMATOUS, HX OF 01/05/2009  . SCHATZKI'S RING, HX OF 01/05/2009  . BACK PAIN 08/04/2008  . DIARRHEA, CHRONIC 08/04/2008  . TOBACCO ABUSE 06/03/2007  . IBS 06/03/2007  . NEPHROLITHIASIS 02/04/2007  . ANXIETY STATE NOS 11/26/2006  . Hypothyroidism 11/01/2006  . HYPERLIPIDEMIA 11/01/2006  . Depression 11/01/2006  . PERIPHERAL NEUROPATHY 11/01/2006  . CATARACT NOS 11/01/2006  . ALLERGIC RHINITIS 11/01/2006  . COPD (chronic obstructive pulmonary disease) ([place]) 11/01/2006  . GERD 11/01/2006  . DEGENERATION, DISC NOS  11/01/2006  . LOW BACK PAIN 11/01/2006  . DVT, HX OF 11/01/2006    Past Surgical History:  Procedure Laterality Date  . BACK SURGERY    . CHOLECYSTECTOMY    . COLONOSCOPY  01/2009   UYQ:IHKVQQ rectum/repeat in 5 yrs  . COLONOSCOPY N/A 04/29/2014   Dr.Rourk- attempted/incomplete colonoscopy. inadequate prep  . COLONOSCOPY N/A 05/27/2014   VZD:GLOVFIEPP coli. Colonic polyps-removed as  described above.Status post segmental biopsy  . DILATION AND CURETTAGE OF UTERUS  1974  . ESOPHAGOGASTRODUODENOSCOPY  05/2010   Dr. Tessie Fass, erosion. 60F dilation  . ESOPHAGOGASTRODUODENOSCOPY N/A 04/29/2014   Dr.Rourk- normal esophagus s/p passage of maloney dilator. small hiatal hernia- bx= chronic inflammation.  Marland Kitchen HAND AMPUTATION Right    traumatic  . MALONEY DILATION N/A 04/29/2014   Procedure: Venia Minks DILATION;  Surgeon: Daneil Dolin, MD;  Location: [facility];  Service: Endoscopy;  Laterality: N/A;  . PARTIAL HYSTERECTOMY  1978  . SHOULDER SURGERY     Left shoulder for RTC;left arm surgery '98/left hand surgery 2001  . TOTAL ABDOMINAL HYSTERECTOMY W/ BILATERAL SALPINGOOPHORECTOMY  2002  . UMBILICAL HERNIA REPAIR  2008     OB History    Gravida  3   Para  2   Term  2   Preterm      AB  1   Living        SAB  1   TAB      Ectopic      Multiple      Live Births              Family History  Problem Relation Age of Onset  . Depression Mother   . Bipolar disorder Mother   . Dementia Mother   . Pulmonary fibrosis Mother   . Alcohol abuse Father   . Aneurysm Father        deceased age 47, brain  . Colon cancer Paternal Grandfather        age greater than 34    Social History   Tobacco Use  . Smoking status: Former Smoker    Packs/day: 0.50    Years: 40.00    Pack years: 20.00    Types: Cigarettes    Quit date: 08/30/2017    Years since quitting: 2.3  . Smokeless tobacco: Never Used  . Tobacco comment: 1/2 pack per day  Substance Use Topics  . Alcohol use: No  . Drug use: No    Home Medications Prior to Admission medications   Medication Sig Start Date End Date Taking? Authorizing Provider  albuterol (PROVENTIL HFA;VENTOLIN HFA) 108 (90 BASE) MCG/ACT inhaler Inhale 2 puffs into the lungs every 4 (four) hours as needed for wheezing or shortness of breath. 10/02/15   Horton, Barbette Hair, MD  cilostazol (PLETAL) 100 MG tablet Take 1 tablet  (100 mg total) by mouth 2 (two) times daily before a meal. 11/10/19   Early, Arvilla Meres, MD  citalopram (CELEXA) 20 MG tablet Take 1 tablet by mouth daily. 08/19/15   [provider]  clonazePAM (KLONOPIN) 0.5 MG tablet Take 1 tablet by mouth at bedtime. 09/19/15   [provider]  FARXIGA 10 MG TABS tablet  07/16/19   [provider]  LANTUS SOLOSTAR 100 UNIT/ML Solostar Pen  10/19/19   [provider]  levothyroxine (SYNTHROID, LEVOTHROID) 200 MCG tablet Take 200 mcg by mouth daily before breakfast.    [provider]  lisinopril (PRINIVIL,ZESTRIL) 20 MG tablet Take 20 mg  by mouth daily.      [provider]  LORazepam (ATIVAN) 1 MG tablet  10/21/19   [provider]  metFORMIN (GLUCOPHAGE) 500 MG tablet  09/18/19   [provider]  omeprazole (PRILOSEC) 20 MG capsule Take 1 capsule by mouth daily. 08/23/15   [provider]  OVER THE COUNTER MEDICATION Place 2 sprays into the nose 2 (two) times daily as needed (for congestion). 4 Way Nasal Spray    [provider]  oxyCODONE-acetaminophen (PERCOCET/ROXICET) 5-325 MG tablet Take 1-2 tablets by mouth every 4 (four) hours as needed for severe pain. 09/20/15   Nat Christen, MD  PARoxetine (PAXIL) 20 MG tablet  09/29/19   [provider]  pravastatin (PRAVACHOL) 40 MG tablet Take 40 mg by mouth daily.      [provider]  pregabalin (LYRICA) 75 MG capsule  10/19/19   [provider]  traZODone (DESYREL) 100 MG tablet Take 1 tablet by mouth at bedtime. 08/19/15   [provider]    Allergies    Iohexol, Tape, Ciprofloxacin, Latex, Penicillins, and Povidone-iodine  Review of Systems   Review of Systems  Constitutional: Positive for diaphoresis. Negative for chills and fever.  Respiratory: Positive for cough, shortness of breath and wheezing.   Cardiovascular: Positive for chest pain. Negative for leg swelling.  Gastrointestinal: Negative  for abdominal pain, nausea and vomiting.  Musculoskeletal: Positive for myalgias.  Neurological: Positive for weakness and light-headedness. Negative for dizziness and syncope.  All other systems reviewed and are negative.   Physical Exam Updated Vital Signs BP 135/67 (BP Location: Left Arm)   Pulse 75   Temp 98.8 F (37.1 C) (Oral)   Resp 18   Ht 5' 6"  (1.676 m)   Wt 83 kg   SpO2 93%   BMI 29.54 kg/m   Physical Exam Vitals and nursing note reviewed.  Constitutional:      General: She is not in acute distress.    Appearance: She is well-developed. She is not toxic-appearing.  HENT:     Head: Normocephalic and atraumatic.  Eyes:     General:        Right eye: No discharge.        Left eye: No discharge.     Conjunctiva/sclera: Conjunctivae normal.  Cardiovascular:     Rate and Rhythm: Normal rate and regular rhythm.  Pulmonary:     Breath sounds: Wheezing (biphasic) present. No rhonchi or rales.     Comments: SpO2 88-92% on RA- patient refusing application of oxygen via [place].  Chest:     Chest wall: Tenderness (R anterior chest wall) present.  Abdominal:     General: There is no distension.     Palpations: Abdomen is soft.     Tenderness: There is no abdominal tenderness.  Musculoskeletal:     Cervical back: Neck supple.     Comments: UEs: RUE s/p amputation @ the forearm.  LEs: Trace symmetric pitting edema to BLEs. No erythema/warmth. Appropriate symmetric temperatures and coloring.   Skin:    General: Skin is warm and dry.     Findings: No rash.  Neurological:     Mental Status: She is alert.     Comments: Clear speech.   Psychiatric:        Behavior: Behavior normal.    ED Results / Procedures / Treatments   Labs (all labs ordered are listed, but only abnormal results are displayed) Labs Reviewed  CBC WITH DIFFERENTIAL/PLATELET - Abnormal;  Notable for the following components:      Result Value   RBC 3.12 (*)    Hemoglobin 8.1 (*)    HCT 27.8 (*)     MCHC 29.1 (*)    RDW 16.7 (*)    All other components within normal limits  COMPREHENSIVE METABOLIC PANEL - Abnormal; Notable for the following components:   Sodium 133 (*)    Potassium 5.2 (*)    Glucose, Bld 123 (*)    Creatinine, Ser 1.23 (*)    GFR calc non Af Amer 45 (*)    GFR calc Af Amer 52 (*)    All other components within normal limits  BRAIN NATRIURETIC PEPTIDE - Abnormal; Notable for the following components:   B Natriuretic Peptide 417.0 (*)    All other components within normal limits  D-DIMER, QUANTITATIVE (NOT AT [facility]) - Abnormal; Notable for the following components:   D-Dimer, Quant 1.18 (*)    All other components within normal limits  CBG MONITORING, ED - Abnormal; Notable for the following components:   Glucose-Capillary 121 (*)    All other components within normal limits  TROPONIN I (HIGH SENSITIVITY) - Abnormal; Notable for the following components:   Troponin I (High Sensitivity) 1,593 (*)    All other components within normal limits  POC SARS CORONAVIRUS 2 AG -  ED    EKG EKG Interpretation  Date/Time:  Saturday December 19 2019 17:07:30 EST Ventricular Rate:  74 PR Interval:    QRS Duration: 95 QT Interval:  367 QTC Calculation: 408 R Axis:   5 Text Interpretation: Sinus rhythm Low voltage, precordial leads Abnormal T, consider ischemia, lateral leads Confirmed by Fredia Sorrow [phone]) on 12/19/2019 5:21:17 PM   Radiology CT Chest Wo Contrast  Result Date: 12/19/2019 CLINICAL DATA:  69 year old female with shortness of breath and dizziness. EXAM: CT CHEST WITHOUT CONTRAST TECHNIQUE: Multidetector CT imaging of the chest was performed following the standard protocol without IV contrast. COMPARISON:  Chest radiograph dated 12/19/2019. FINDINGS: Evaluation of this exam is limited in the absence of intravenous contrast. Cardiovascular: There is no cardiomegaly or pericardial effusion. There is 3 vessel coronary vascular calcification. There is  hypoattenuation of the cardiac blood pool suggestive of a degree of anemia. Clinical correlation is recommended. Moderate atherosclerotic calcification of the thoracic aorta. The central pulmonary arteries are grossly unremarkable. Mediastinum/Nodes: Mildly enlarged bilateral hilar and mediastinal lymph nodes measure approximately 14 mm in short axis in the prevascular space and 18 mm in short axis in the subcarinal region. Evaluation of the hilar lymph nodes is limited in the absence of intravenous contrast. There is however fullness of the hilar region likely combination of vascular confluence and enlarged lymph nodes. There is an apparent right hilar adenopathy measuring 14 mm in short axis. The esophagus and the thyroid gland are grossly unremarkable. No mediastinal fluid collection. Lungs/Pleura: There are small bilateral pleural effusions, right greater than left. There is diffuse interstitial and interlobular septal prominence consistent with mild edema. There is background of chronic interstitial coarsening. There is a 4.6 x 4.0 cm focal area of consolidation in the right middle lobe corresponding to the opacity seen on the earlier radiograph. This likely represent pneumonia. However, a mass or malignancy is not excluded. Clinical correlation and close follow-up after treatment and resolution of acute symptoms recommended. There is no pneumothorax. The central airways are patent. Upper Abdomen: Cirrhosis. Cholecystectomy. Small ascites. Musculoskeletal: Degenerative changes of the spine. No acute osseous pathology. IMPRESSION: 1. Focal  consolidation in the right middle lobe most consistent with pneumonia. A mass or malignancy is not excluded. Clinical correlation and close follow-up after treatment and resolution of acute symptoms recommended. 2. Small bilateral pleural effusions and findings of interstitial edema. 3. Mildly enlarged bilateral hilar and mediastinal lymph nodes, likely reactive. 4. Cirrhosis  with small ascites. 5. Aortic Atherosclerosis (ICD10-I70.0). Electronically Signed   By: Anner Crete M.D.   On: 12/19/2019 18:23   CT Angio Chest PE W/Cm &/Or Wo Cm  Result Date: 12/19/2019 CLINICAL DATA:  Shortness of breath. EXAM: CT ANGIOGRAPHY CHEST WITH CONTRAST TECHNIQUE: Multidetector CT imaging of the chest was performed using the standard protocol during bolus administration of intravenous contrast. Multiplanar CT image reconstructions and MIPs were obtained to evaluate the vascular anatomy. CONTRAST:  42m OMNIPAQUE IOHEXOL 350 MG/ML SOLN Patient pre-medicated due to reported contrast allergy. COMPARISON:  Noncontrast chest CT earlier this day. FINDINGS: Cardiovascular: There are no filling defects within the pulmonary arteries to suggest pulmonary embolus. Aortic atherosclerosis without dissection. No pericardial effusion. Mediastinum/Nodes: Again seen bilateral hilar and mediastinal adenopathy. Representative prevascular node measures 14 mm short axis, subcarinal node measures 17 mm short axis. Enlarged right hilar node measures 17 mm short axis. Prominent left hilar nodes to a lesser extent. No esophageal wall thickening. No thyroid nodule. Lungs/Pleura: Small bilateral pleural effusions, right greater than left, unchanged from earlier this day. Interstitial and septal thickening, basilar and subpleural predominance, also unchanged. Focal consolidation in the right middle lobe measuring 4.2 x 3.8 cm abutting the fissure, unchanged. Mild central bronchial thickening. Minimal emphysema. Upper Abdomen: Nodular hepatic contours consistent with cirrhosis. Cholecystectomy. Atherosclerosis of the upper abdominal aorta. Minimal perihepatic ascites again seen. Musculoskeletal: There are no acute or suspicious osseous abnormalities. Again seen degenerative change of the spine. Review of the MIP images confirms the above findings. IMPRESSION: 1. No pulmonary embolus. 2. Exam is otherwise unchanged from  noncontrast CT earlier this day. Focal masslike consolidation in the right middle lobe which may represent pneumonia, however radiographic follow-up recommended to document resolution and exclude underlying neoplasm. 3. Small pleural effusions and findings of pulmonary edema. Enlarged mediastinal and hilar lymph nodes, likely reactive but nonspecific in the setting. 4. Cirrhosis. Minimal perihepatic ascites. Aortic Atherosclerosis (ICD10-I70.0). Electronically Signed   By: MKeith RakeM.D.   On: 12/19/2019 23:01   DG Chest Port 1 View  Result Date: 12/19/2019 CLINICAL DATA:  69year old female with shortness of breath. EXAM: PORTABLE CHEST 1 VIEW COMPARISON:  Chest radiograph dated 10/02/2015. FINDINGS: There is mild cardiomegaly and mild vascular congestion. Background of chronic interstitial coarsening and mild bronchitic changes. Faint diffuse bilateral peripheral and subpleural hazy densities may represent chronic changes or mild edema. Atypical infiltrate is not excluded. Clinical correlation is recommended. There is a 2.8 x 5.0 cm ovoid opacity in the right mid lung field which may represent a round atelectasis, infiltrate, or loculated fluid within the fissure. A mass is not excluded. Clinical correlation and close follow-up after treatment and resolution of acute symptoms or further evaluation with CT is recommended. There is no pleural effusion or pneumothorax. Atherosclerotic calcification of the aorta. No acute osseous pathology. IMPRESSION: 1. Cardiomegaly with mild vascular congestion. 2. Ovoid opacity in the right mid lung field may represent round atelectasis, infiltrate, or loculated fluid within the fissure. Lung mass is not excluded. Clinical correlation and close follow-up after treatment and resolution of acute symptoms or further evaluation with CT is recommended. Electronically Signed   By: AAnner Crete  M.D.   On: 12/19/2019 17:13    Procedures .Critical Care Performed by:  Amaryllis Dyke, PA-C Authorized by: Amaryllis Dyke, PA-C    CRITICAL CARE Performed by: Kennith Maes   Total critical care time: 60 minutes  Critical care time was exclusive of separately billable procedures and treating other patients.  Critical care was necessary to treat or prevent imminent or life-threatening deterioration.  Critical care was time spent personally by me on the following activities: development of treatment plan with patient and/or surrogate as well as nursing, discussions with consultants, evaluation of patient's response to treatment, examination of patient, obtaining history from patient or surrogate, ordering and performing treatments and interventions, ordering and review of laboratory studies, ordering and review of radiographic studies, pulse oximetry and re-evaluation of patient's condition.    (including critical care time)  Medications Ordered in ED Medications  traZODone (DESYREL) tablet 100 mg (100 mg Oral Given 12/20/19 0130)  albuterol (VENTOLIN HFA) 108 (90 Base) MCG/ACT inhaler 4 puff (4 puffs Inhalation Given 12/19/19 1705)  AeroChamber Plus Flo-Vu Medium MISC 1 each (1 each Other Given 12/19/19 1705)  diphenhydrAMINE (BENADRYL) capsule 50 mg (50 mg Oral Given 12/19/19 2038)    Or  diphenhydrAMINE (BENADRYL) injection 50 mg ( Intravenous See Alternative 12/19/19 2038)  aspirin chewable tablet 324 mg (324 mg Oral Given 12/19/19 1820)  hydrocortisone sodium succinate (SOLU-CORTEF) injection 200 mg (200 mg Intravenous Given 12/19/19 1825)  cefTRIAXone (ROCEPHIN) 1 g in sodium chloride 0.9 % 100 mL IVPB (0 g Intravenous Stopped 12/19/19 1932)  azithromycin (ZITHROMAX) 500 mg in sodium chloride 0.9 % 250 mL IVPB (0 mg Intravenous Stopped 12/19/19 2325)  iohexol (OMNIPAQUE) 350 MG/ML injection 75 mL (75 mLs Intravenous Contrast Given 12/19/19 2234)  rOPINIRole (REQUIP) tablet 0.5 mg (0.5 mg Oral Given 12/20/19 0130)  LORazepam (ATIVAN) injection 0.5 mg  (0.5 mg Intravenous Given 12/20/19 0130)    ED Course  I have reviewed the triage vital signs and the nursing notes.  Pertinent labs & imaging results that were available during my care of the patient were reviewed by me and considered in my medical decision making (see chart for details).    MDM Rules/Calculators/A&P                     Patient presents to the ED with complaints of progressively worsening shortness of breath with exertion for the past few weeks that worsened more so this week. She has associated cough, wheezing, and R sided chest discomfort. Patient is nontoxic, intermittently mildly hypoxic into the upper 80s, vitals otherwise fairly unremarkable. Patient has biphasic wheezing. She has R sided chest wall tenderness which somewhat reproduces chest discomfort. DDX: pneumonia, COPD exacerbation, PE, new onset CHF, atypical ACS, arrhythmia, critical anemia, orthostatic hypotension, electrolyte dysfunction, renal failure  Orthostatic VS for the past 24 hrs:  BP- Lying Pulse- Lying BP- Sitting Pulse- Sitting BP- Standing at 0 minutes Pulse- Standing at 0 minutes  12/19/19 1633 140/60 77 147/62 70 101/54 80  + orthostatics   EKG: Sinus rhythm T wave abnormality in I & aVL- reviewed prior EKGs and this appears fairly similar on my interpretation.   CBG: 121 EKG: Sinus rhythm. No STEMI. Appears fairly similar to prior on chart review comparison on my interpretation.  CXR: 1. Cardiomegaly with mild vascular congestion. 2. Ovoid opacity in the right mid lung field may represent round atelectasis, infiltrate, or loculated fluid within the fissure. Lung mass is not excluded. Clinical correlation  and close follow-up after treatment and resolution of acute symptoms or further evaluation with CT is recommended.  Typically would CTA for PE and for further assessment of xray findings and to evaluate for PE, however patient with contrast dye allergy.   17:22: Discussed w/ Dr. Rogene Houston-  recommends start pre-medications for contrast dye allergy, obtain d-dimer, start with CT chest wo contrast.   CBC: Anemia which is decreased from prior however most recent labs are from 4 years prior. No leukocytosis or platelet dysfunction.  CMP: Renal function similar to prior, mild hyperkalemia @ 5.2.  Troponin: 1,593--> Re-reviewed EKG- No STEMI, no active chest pain currently. Pending delta & CTA. Aspirin ordered.  BNP: Elevated @ 417  CT chest wo: 1. Focal consolidation in the right middle lobe most consistent with pneumonia. A mass or malignancy is not excluded. Clinical correlation and close follow-up after treatment and resolution of acute symptoms recommended. 2. Small bilateral pleural effusions and findings of interstitial edema. 3. Mildly enlarged bilateral hilar and mediastinal lymph nodes, likely reactive. 4. Cirrhosis with small ascites. 5. Aortic Atherosclerosis  --> start CAP coverage in the ED Ddimer positive- CTA ordered.   19:00: RE-EVAL: Patient remains with borderline SpO2 did desaturate briefly to 86% with good wave form, she does not want to be given supplemental oxygen at this time. She states she will not stay in the hospital over night. I had a discussion with her and her friend via telephone with her consent at length, she is willing to stay for CTA and re-discuss treatment plan following results. Refusing PCR covid swab currently.   19:11: Delta troponin flat.  CTA negative for PE, exam otherwise similar to CT chest w/o contrast as detailed above.   Patient here with likely multi-factorial dyspnea and acute hypoxic respiratory failure currently refusing to wear oxygen via [place] with SpO2 86-92% on RA: Community acquired pneumonia- abx started, COPD exacerbation- given albuterol inhaler as well as hydrocortisone (would not be steroid of choice, but given to pre-medication for CTA), and what is suspected to be new onset CHF, she also has orthostatic hypotension, will defer  diuretics to admitting team should patient wish to stay.   23:30: RE-EVAL: Patient still is not keen on staying in the hospital overnight, she would like a little time to think about it.    00:30: RE-EVAL: Re- discussed with patient and her friend via telephone at length, risk/benefits discussed and strongly advised that patient remain in the hospital, ultimately she was agreeable if we were able to give her her nighttime meds, she states she takes Ativan, Requip, and trazodone.   12:52: CONSULT: Discussed with cardiologist Dr. Radford Pax- states patient saw Dr. Einar Gip in 2012, will consult whoever is on-call for their group.  01:14: CONSULT: Discussed with cardiologist Dr. Virgina Jock felt troponin elevation is most likely multifactorial, requesting admission to medical service at [facility], cardiology team will see in consultation.  Re discussed with patient & her friend, patient is ultimately agreeable to admission at [facility].  01:28: CONSULT: Discussed with hospitalist Dr. Darrick Meigs- accepts admission.    Findings and plan of care discussed with supervising physician Dr. Rogene Houston who has evaluated patient & is in agreement.   Final Clinical Impression(s) / ED Diagnoses Final diagnoses:  Acute hypoxemic respiratory failure ([place])  COPD exacerbation ([place])  Community acquired pneumonia of right middle lobe of lung  Acute congestive heart failure, unspecified heart failure type ([facility])  Orthostatic hypotension  Anemia, unspecified type    Rx /  DC Orders ED Discharge Orders    None       Amaryllis Dyke, PA-C 12/20/19 0156    Amaryllis Dyke, PA-C 12/20/19 0157    Fredia Sorrow, MD 12/20/19 [phone]

## 2019-12-19 NOTE — ED Notes (Signed)
Date and time results received: 12/19/19 1815 (use smartphrase ".now" to insert current time)  Test: troponin Critical Value: 1593  Name of Provider Notified: Kennith Maes PA  Orders Received? Or Actions Taken?: new orders

## 2019-12-20 DIAGNOSIS — N179 Acute kidney failure, unspecified: Secondary | ICD-10-CM | POA: Diagnosis present

## 2019-12-20 DIAGNOSIS — J441 Chronic obstructive pulmonary disease with (acute) exacerbation: Secondary | ICD-10-CM | POA: Diagnosis present

## 2019-12-20 DIAGNOSIS — Z794 Long term (current) use of insulin: Secondary | ICD-10-CM | POA: Diagnosis not present

## 2019-12-20 DIAGNOSIS — N183 Chronic kidney disease, stage 3 unspecified: Secondary | ICD-10-CM | POA: Diagnosis present

## 2019-12-20 DIAGNOSIS — J440 Chronic obstructive pulmonary disease with acute lower respiratory infection: Secondary | ICD-10-CM | POA: Diagnosis present

## 2019-12-20 DIAGNOSIS — E782 Mixed hyperlipidemia: Secondary | ICD-10-CM | POA: Diagnosis present

## 2019-12-20 DIAGNOSIS — J189 Pneumonia, unspecified organism: Secondary | ICD-10-CM

## 2019-12-20 DIAGNOSIS — R0789 Other chest pain: Secondary | ICD-10-CM

## 2019-12-20 DIAGNOSIS — I130 Hypertensive heart and chronic kidney disease with heart failure and stage 1 through stage 4 chronic kidney disease, or unspecified chronic kidney disease: Secondary | ICD-10-CM | POA: Diagnosis present

## 2019-12-20 DIAGNOSIS — J9601 Acute respiratory failure with hypoxia: Secondary | ICD-10-CM

## 2019-12-20 DIAGNOSIS — I25110 Atherosclerotic heart disease of native coronary artery with unstable angina pectoris: Secondary | ICD-10-CM | POA: Diagnosis present

## 2019-12-20 DIAGNOSIS — Z20822 Contact with and (suspected) exposure to covid-19: Secondary | ICD-10-CM | POA: Diagnosis present

## 2019-12-20 DIAGNOSIS — I5031 Acute diastolic (congestive) heart failure: Secondary | ICD-10-CM | POA: Diagnosis present

## 2019-12-20 DIAGNOSIS — Z888 Allergy status to other drugs, medicaments and biological substances status: Secondary | ICD-10-CM | POA: Diagnosis not present

## 2019-12-20 DIAGNOSIS — E039 Hypothyroidism, unspecified: Secondary | ICD-10-CM | POA: Diagnosis present

## 2019-12-20 DIAGNOSIS — D509 Iron deficiency anemia, unspecified: Secondary | ICD-10-CM | POA: Diagnosis present

## 2019-12-20 DIAGNOSIS — Z91048 Other nonmedicinal substance allergy status: Secondary | ICD-10-CM | POA: Diagnosis not present

## 2019-12-20 DIAGNOSIS — K5520 Angiodysplasia of colon without hemorrhage: Secondary | ICD-10-CM | POA: Diagnosis present

## 2019-12-20 DIAGNOSIS — R079 Chest pain, unspecified: Secondary | ICD-10-CM | POA: Diagnosis present

## 2019-12-20 DIAGNOSIS — Z880 Allergy status to penicillin: Secondary | ICD-10-CM | POA: Diagnosis not present

## 2019-12-20 DIAGNOSIS — I509 Heart failure, unspecified: Secondary | ICD-10-CM | POA: Diagnosis not present

## 2019-12-20 DIAGNOSIS — Z91040 Latex allergy status: Secondary | ICD-10-CM | POA: Diagnosis not present

## 2019-12-20 DIAGNOSIS — I951 Orthostatic hypotension: Secondary | ICD-10-CM | POA: Diagnosis present

## 2019-12-20 DIAGNOSIS — R0603 Acute respiratory distress: Secondary | ICD-10-CM | POA: Diagnosis not present

## 2019-12-20 DIAGNOSIS — R072 Precordial pain: Secondary | ICD-10-CM | POA: Diagnosis not present

## 2019-12-20 DIAGNOSIS — K2970 Gastritis, unspecified, without bleeding: Secondary | ICD-10-CM | POA: Diagnosis present

## 2019-12-20 DIAGNOSIS — Z87891 Personal history of nicotine dependence: Secondary | ICD-10-CM | POA: Diagnosis not present

## 2019-12-20 DIAGNOSIS — N1831 Chronic kidney disease, stage 3a: Secondary | ICD-10-CM | POA: Diagnosis present

## 2019-12-20 DIAGNOSIS — I21A1 Myocardial infarction type 2: Secondary | ICD-10-CM | POA: Diagnosis present

## 2019-12-20 DIAGNOSIS — Z955 Presence of coronary angioplasty implant and graft: Secondary | ICD-10-CM | POA: Diagnosis not present

## 2019-12-20 LAB — HEMOGLOBIN A1C
Hgb A1c MFr Bld: 8.2 % — ABNORMAL HIGH (ref 4.8–5.6)
Mean Plasma Glucose: 188.64 mg/dL

## 2019-12-20 LAB — COMPREHENSIVE METABOLIC PANEL
ALT: 12 U/L (ref 0–44)
AST: 15 U/L (ref 15–41)
Albumin: 3.5 g/dL (ref 3.5–5.0)
Alkaline Phosphatase: 61 U/L (ref 38–126)
Anion gap: 8 (ref 5–15)
BUN: 23 mg/dL (ref 8–23)
CO2: 23 mmol/L (ref 22–32)
Calcium: 8.8 mg/dL — ABNORMAL LOW (ref 8.9–10.3)
Chloride: 102 mmol/L (ref 98–111)
Creatinine, Ser: 1.34 mg/dL — ABNORMAL HIGH (ref 0.44–1.00)
GFR calc Af Amer: 47 mL/min — ABNORMAL LOW (ref >60)
GFR calc non Af Amer: 41 mL/min — ABNORMAL LOW (ref >60)
Glucose, Bld: 196 mg/dL — ABNORMAL HIGH (ref 70–99)
Potassium: 5.7 mmol/L — ABNORMAL HIGH (ref 3.5–5.1)
Sodium: 133 mmol/L — ABNORMAL LOW (ref 135–145)
Total Bilirubin: 0.3 mg/dL (ref 0.3–1.2)
Total Protein: 7.1 g/dL (ref 6.5–8.1)

## 2019-12-20 LAB — CBC
HCT: 26.1 % — ABNORMAL LOW (ref 36.0–46.0)
Hemoglobin: 7.7 g/dL — ABNORMAL LOW (ref 12.0–15.0)
MCH: 26.5 pg (ref 26.0–34.0)
MCHC: 29.5 g/dL — ABNORMAL LOW (ref 30.0–36.0)
MCV: 89.7 fL (ref 80.0–100.0)
Platelets: 231 10*3/uL (ref 150–400)
RBC: 2.91 MIL/uL — ABNORMAL LOW (ref 3.87–5.11)
RDW: 16.6 % — ABNORMAL HIGH (ref 11.5–15.5)
WBC: 6.2 10*3/uL (ref 4.0–10.5)
nRBC: 0 % (ref 0.0–0.2)

## 2019-12-20 LAB — GLUCOSE, CAPILLARY
Glucose-Capillary: 276 mg/dL — ABNORMAL HIGH (ref 70–99)
Glucose-Capillary: 373 mg/dL — ABNORMAL HIGH (ref 70–99)
Glucose-Capillary: 224 mg/dL — ABNORMAL HIGH (ref 70–99)

## 2019-12-20 LAB — TROPONIN I (HIGH SENSITIVITY): Troponin I (High Sensitivity): 941 ng/L (ref <18)

## 2019-12-20 LAB — HIV ANTIBODY (ROUTINE TESTING W REFLEX): HIV Screen 4th Generation wRfx: NONREACTIVE

## 2019-12-20 LAB — RESPIRATORY PANEL BY RT PCR (FLU A&B, COVID)
Influenza A by PCR: NEGATIVE
Influenza B by PCR: NEGATIVE
SARS Coronavirus 2 by RT PCR: NEGATIVE

## 2019-12-20 MED ORDER — IPRATROPIUM-ALBUTEROL 0.5-2.5 (3) MG/3ML IN SOLN
3.0000 mL | Freq: Four times a day (QID) | RESPIRATORY_TRACT | Status: DC
  Administered 2019-12-20 (×4): 3 mL via RESPIRATORY_TRACT
  Filled 2019-12-20 (×3): qty 3

## 2019-12-20 MED ORDER — LORAZEPAM 2 MG/ML IJ SOLN
0.5000 mg | Freq: Once | INTRAMUSCULAR | Status: AC
  Administered 2019-12-20: 0.5 mg via INTRAVENOUS
  Filled 2019-12-20: qty 1

## 2019-12-20 MED ORDER — LISINOPRIL 10 MG PO TABS: 20.0000 mg | ORAL_TABLET | Freq: Every day | ORAL | Status: DC

## 2019-12-20 MED ORDER — CILOSTAZOL 100 MG PO TABS
100.0000 mg | ORAL_TABLET | Freq: Two times a day (BID) | ORAL | Status: DC
  Administered 2019-12-20 – 2019-12-22 (×5): 100 mg via ORAL
  Filled 2019-12-20 (×9): qty 1

## 2019-12-20 MED ORDER — SODIUM CHLORIDE 0.9% FLUSH
3.0000 mL | Freq: Two times a day (BID) | INTRAVENOUS | Status: DC
  Administered 2019-12-20 – 2019-12-23 (×7): 3 mL via INTRAVENOUS

## 2019-12-20 MED ORDER — SODIUM CHLORIDE 0.9 % IV SOLN: 250.0000 mL | INTRAVENOUS | Status: DC | PRN

## 2019-12-20 MED ORDER — LEVOTHYROXINE SODIUM 25 MCG PO TABS: 125.0000 ug | ORAL_TABLET | Freq: Every day | ORAL | Status: DC

## 2019-12-20 MED ORDER — METHYLPREDNISOLONE SODIUM SUCC 40 MG IJ SOLR
40.0000 mg | Freq: Two times a day (BID) | INTRAMUSCULAR | Status: DC
  Administered 2019-12-21 – 2019-12-22 (×3): 40 mg via INTRAVENOUS
  Filled 2019-12-20 (×3): qty 1

## 2019-12-20 MED ORDER — PAROXETINE HCL 20 MG PO TABS
20.0000 mg | ORAL_TABLET | Freq: Every day | ORAL | Status: DC
  Administered 2019-12-20 – 2019-12-24 (×5): 20 mg via ORAL
  Filled 2019-12-20 (×7): qty 1

## 2019-12-20 MED ORDER — SODIUM ZIRCONIUM CYCLOSILICATE 10 G PO PACK: 10.0000 g | PACK | Freq: Every day | ORAL | Status: DC

## 2019-12-20 MED ORDER — ROPINIROLE HCL 0.25 MG PO TABS
ORAL_TABLET | ORAL | Status: AC
  Filled 2019-12-20: qty 2

## 2019-12-20 MED ORDER — METHYLPREDNISOLONE SODIUM SUCC 125 MG IJ SOLR
60.0000 mg | Freq: Four times a day (QID) | INTRAMUSCULAR | Status: DC
  Administered 2019-12-20 (×2): 60 mg via INTRAVENOUS
  Filled 2019-12-20 (×2): qty 2

## 2019-12-20 MED ORDER — PRAVASTATIN SODIUM 40 MG PO TABS
40.0000 mg | ORAL_TABLET | Freq: Every day | ORAL | Status: DC
  Administered 2019-12-20 – 2019-12-23 (×4): 40 mg via ORAL
  Filled 2019-12-20 (×2): qty 1
  Filled 2019-12-20: qty 4
  Filled 2019-12-20 (×2): qty 1

## 2019-12-20 MED ORDER — TRAZODONE HCL 100 MG PO TABS
100.0000 mg | ORAL_TABLET | Freq: Every day | ORAL | Status: DC
  Administered 2019-12-20 – 2019-12-23 (×4): 100 mg via ORAL
  Filled 2019-12-20 (×4): qty 1

## 2019-12-20 MED ORDER — ARFORMOTEROL TARTRATE 15 MCG/2ML IN NEBU
15.0000 ug | INHALATION_SOLUTION | Freq: Two times a day (BID) | RESPIRATORY_TRACT | Status: DC
  Administered 2019-12-20 – 2019-12-23 (×7): 15 ug via RESPIRATORY_TRACT
  Filled 2019-12-20 (×9): qty 2

## 2019-12-20 MED ORDER — SODIUM ZIRCONIUM CYCLOSILICATE 10 G PO PACK
10.0000 g | PACK | Freq: Once | ORAL | Status: AC
  Administered 2019-12-20: 10 g via ORAL
  Filled 2019-12-20: qty 1

## 2019-12-20 MED ORDER — FUROSEMIDE 10 MG/ML IJ SOLN
40.0000 mg | Freq: Once | INTRAMUSCULAR | Status: AC
  Administered 2019-12-20: 40 mg via INTRAVENOUS
  Filled 2019-12-20: qty 4

## 2019-12-20 MED ORDER — INSULIN GLARGINE 100 UNIT/ML ~~LOC~~ SOLN
20.0000 [IU] | Freq: Once | SUBCUTANEOUS | Status: AC
  Administered 2019-12-20: 20 [IU] via SUBCUTANEOUS
  Filled 2019-12-20: qty 0.2

## 2019-12-20 MED ORDER — FUROSEMIDE 10 MG/ML IJ SOLN
20.0000 mg | Freq: Once | INTRAMUSCULAR | Status: AC
  Administered 2019-12-20: 20 mg via INTRAVENOUS
  Filled 2019-12-20: qty 2

## 2019-12-20 MED ORDER — SODIUM CHLORIDE 0.9 % IV SOLN
500.0000 mg | INTRAVENOUS | Status: DC
  Administered 2019-12-20 – 2019-12-21 (×2): 500 mg via INTRAVENOUS
  Filled 2019-12-20 (×3): qty 500

## 2019-12-20 MED ORDER — PANTOPRAZOLE SODIUM 40 MG PO TBEC
40.0000 mg | DELAYED_RELEASE_TABLET | Freq: Every day | ORAL | Status: DC
  Administered 2019-12-20 – 2019-12-21 (×2): 40 mg via ORAL
  Filled 2019-12-20 (×2): qty 1

## 2019-12-20 MED ORDER — SODIUM CHLORIDE 0.9 % IV SOLN
1.0000 g | INTRAVENOUS | Status: DC
  Administered 2019-12-20 – 2019-12-22 (×3): 1 g via INTRAVENOUS
  Filled 2019-12-20 (×3): qty 10

## 2019-12-20 MED ORDER — ROPINIROLE HCL 0.5 MG PO TABS
0.5000 mg | ORAL_TABLET | Freq: Once | ORAL | Status: AC
  Administered 2019-12-20: 0.5 mg via ORAL
  Filled 2019-12-20: qty 1

## 2019-12-20 MED ORDER — ONDANSETRON HCL 4 MG PO TABS: 4.0000 mg | ORAL_TABLET | Freq: Four times a day (QID) | ORAL | Status: DC | PRN

## 2019-12-20 MED ORDER — ENOXAPARIN SODIUM 40 MG/0.4ML ~~LOC~~ SOLN
40.0000 mg | SUBCUTANEOUS | Status: DC
  Administered 2019-12-20 – 2019-12-24 (×4): 40 mg via SUBCUTANEOUS
  Filled 2019-12-20 (×4): qty 0.4

## 2019-12-20 MED ORDER — OXYCODONE-ACETAMINOPHEN 5-325 MG PO TABS
1.0000 | ORAL_TABLET | ORAL | Status: DC | PRN
  Administered 2019-12-20: 1 via ORAL
  Filled 2019-12-20: qty 1

## 2019-12-20 MED ORDER — SODIUM CHLORIDE 0.9% FLUSH: 3.0000 mL | INTRAVENOUS | Status: DC | PRN

## 2019-12-20 MED ORDER — LEVOTHYROXINE SODIUM 25 MCG PO TABS
125.0000 ug | ORAL_TABLET | Freq: Every day | ORAL | Status: DC
  Administered 2019-12-21 – 2019-12-24 (×4): 125 ug via ORAL
  Filled 2019-12-20 (×4): qty 1

## 2019-12-20 MED ORDER — IPRATROPIUM-ALBUTEROL 0.5-2.5 (3) MG/3ML IN SOLN
3.0000 mL | Freq: Four times a day (QID) | RESPIRATORY_TRACT | Status: DC | PRN
  Administered 2019-12-21 (×2): 3 mL via RESPIRATORY_TRACT
  Filled 2019-12-20: qty 3

## 2019-12-20 MED ORDER — NITROGLYCERIN IN D5W 200-5 MCG/ML-% IV SOLN: 2.0000 ug/min | INTRAVENOUS | Status: DC

## 2019-12-20 MED ORDER — INSULIN GLARGINE 100 UNIT/ML ~~LOC~~ SOLN
20.0000 [IU] | Freq: Every day | SUBCUTANEOUS | Status: DC
  Administered 2019-12-21 – 2019-12-22 (×2): 20 [IU] via SUBCUTANEOUS
  Filled 2019-12-20 (×4): qty 0.2

## 2019-12-20 MED ORDER — LORAZEPAM 1 MG PO TABS
1.0000 mg | ORAL_TABLET | Freq: Two times a day (BID) | ORAL | Status: DC
  Administered 2019-12-20 – 2019-12-24 (×9): 1 mg via ORAL
  Filled 2019-12-20 (×9): qty 1

## 2019-12-20 MED ORDER — ROPINIROLE HCL 0.25 MG PO TABS
0.2500 mg | ORAL_TABLET | Freq: Once | ORAL | Status: DC
  Filled 2019-12-20: qty 1

## 2019-12-20 MED ORDER — BUDESONIDE 0.5 MG/2ML IN SUSP
0.5000 mg | Freq: Two times a day (BID) | RESPIRATORY_TRACT | Status: DC
  Administered 2019-12-20 – 2019-12-23 (×7): 0.5 mg via RESPIRATORY_TRACT
  Filled 2019-12-20 (×8): qty 2

## 2019-12-20 MED ORDER — IPRATROPIUM-ALBUTEROL 0.5-2.5 (3) MG/3ML IN SOLN
3.0000 mL | Freq: Three times a day (TID) | RESPIRATORY_TRACT | Status: DC
  Administered 2019-12-21 – 2019-12-22 (×3): 3 mL via RESPIRATORY_TRACT
  Filled 2019-12-20 (×4): qty 3

## 2019-12-20 MED ORDER — INSULIN GLARGINE 100 UNIT/ML ~~LOC~~ SOLN
40.0000 [IU] | Freq: Every day | SUBCUTANEOUS | Status: DC
  Filled 2019-12-20 (×3): qty 0.4

## 2019-12-20 MED ORDER — INSULIN ASPART 100 UNIT/ML ~~LOC~~ SOLN
0.0000 [IU] | Freq: Three times a day (TID) | SUBCUTANEOUS | Status: DC
  Administered 2019-12-20: 9 [IU] via SUBCUTANEOUS
  Administered 2019-12-20: 5 [IU] via SUBCUTANEOUS
  Administered 2019-12-21: 3 [IU] via SUBCUTANEOUS
  Administered 2019-12-22: 5 [IU] via SUBCUTANEOUS
  Administered 2019-12-22: 1 [IU] via SUBCUTANEOUS
  Administered 2019-12-22: 5 [IU] via SUBCUTANEOUS
  Administered 2019-12-24: 2 [IU] via SUBCUTANEOUS

## 2019-12-20 MED ORDER — TRAZODONE HCL 50 MG PO TABS
100.0000 mg | ORAL_TABLET | Freq: Every day | ORAL | Status: DC
  Administered 2019-12-20: 100 mg via ORAL
  Filled 2019-12-20: qty 2

## 2019-12-20 MED ORDER — PREGABALIN 75 MG PO CAPS
75.0000 mg | ORAL_CAPSULE | Freq: Every day | ORAL | Status: DC
  Administered 2019-12-20 – 2019-12-24 (×5): 75 mg via ORAL
  Filled 2019-12-20 (×5): qty 1

## 2019-12-20 MED ORDER — ASPIRIN EC 81 MG PO TBEC
81.0000 mg | DELAYED_RELEASE_TABLET | Freq: Every day | ORAL | Status: DC
  Administered 2019-12-20 – 2019-12-23 (×4): 81 mg via ORAL
  Filled 2019-12-20 (×5): qty 1

## 2019-12-20 MED ORDER — ONDANSETRON HCL 4 MG/2ML IJ SOLN: 4.0000 mg | Freq: Four times a day (QID) | INTRAMUSCULAR | Status: DC | PRN

## 2019-12-20 NOTE — Progress Notes (Signed)
Patient seen and examined.  Admitted after midnight secondary to chest pain and shortness of breath; work-up in the ED demonstrating multifactorial causes with findings of right middle lobe pneumonia, COPD exacerbation, mild CHF exacerbation and elevated troponin with demand ischemia.  Currently hemodynamically stable and chest pain-free.  Please refer to H&P written by Dr. Darrick Meigs for further info/details on presentation.  Plan: -Patient will be transferred to [facility], [place] for further evaluation and management; including evaluation by her cardiologist (Dr. Einar Gip). -continue aspirin and statins; no B-blocker in the setting of decompensated COPD. -Continue current IV antibiotics -Continue IV steroids, flutter valve, mucolytic's and nebulizer management; Pulmicort and Brovana has been added to her regimen.  Patient is still with expiratory wheezing on examination. -Follow daily weights, low-sodium diet and strict I's and O's. -Assess clinical response.  Barton Dubois MD [phone]

## 2019-12-20 NOTE — H&P (Addendum)
TRH H&P    Patient Demographics:    [name], is a 69 y.o. female  MRN: [id number]  DOB - [birth year]  Admit Date - 12/19/2019  Referring MD/NP/PA: Kennith Maes  Outpatient Primary MD for the patient is Lucia Gaskins, MD  Patient coming from: Home  Chief complaint-chest pain   HPI:    [name]  is a 69 y.o. female, with history of COPD, diabetes mellitus type 2, hyperlipidemia, hypothyroidism, prior DVT not on anticoagulation, anxiety, depression came to hospital with complaints of chest pain and shortness of breath for past 3 days.  She complains of dyspnea on exertion.  Chest pain is located at left chest with no radiation. She denies fever or chills. Denies nausea or vomiting Denies abdominal pain or dysuria.  In the ED, chest x-ray showed ovoid opacity in the right midlung field.  CT chest confirmed pneumonia.  Patient started on ceftriaxone and Zithromax.  Also found to have elevated troponin I 593, 1584.  Cardiology was consulted by ED physician.  Dr. Virgina Jock recommended patient to be transferred to [facility].    Review of systems:    In addition to the HPI above,    All other systems reviewed and are negative.    Past History of the following :    Past Medical History:  Diagnosis Date  . Allergic rhinitis   . 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 ASCVD (arteriosclerotic cardiovascular disease)    MI in 96 requiring BMS CX; DES to M1 in 2000;normal coronary angiography in 2004  . Cholelithiasis   . COPD (chronic obstructive pulmonary disease) ([place])   . DDD (degenerative disc disease), lumbar   . Depression   . Diabetes mellitus   . Diabetes mellitus type I ([place])   . DVT (deep venous thrombosis) ([place])   . GERD (gastroesophageal reflux disease)   . Hyperlipidemia   . Hypothyroidism   . Low back pain   . Nephrolithiasis 2006   stone extraction   .  Peripheral neuropathy   . Sciatic pain    right  . Tobacco abuse   . Tremor       Past Surgical History:  Procedure Laterality Date  . BACK SURGERY    . CHOLECYSTECTOMY    . COLONOSCOPY  01/2009   NWG:NFAOZH rectum/repeat in 5 yrs  . COLONOSCOPY N/A 04/29/2014   Dr.Rourk- attempted/incomplete colonoscopy. inadequate prep  . COLONOSCOPY N/A 05/27/2014   YQM:VHQIONGEX coli. Colonic polyps-removed as described above.Status post segmental biopsy  . DILATION AND CURETTAGE OF UTERUS  1974  . ESOPHAGOGASTRODUODENOSCOPY  05/2010   Dr. Tessie Fass, erosion. 74F dilation  . ESOPHAGOGASTRODUODENOSCOPY N/A 04/29/2014   Dr.Rourk- normal esophagus s/p passage of maloney dilator. small hiatal hernia- bx= chronic inflammation.  Marland Kitchen HAND AMPUTATION Right    traumatic  . MALONEY DILATION N/A 04/29/2014   Procedure: Venia Minks DILATION;  Surgeon: Daneil Dolin, MD;  Location: [facility];  Service: Endoscopy;  Laterality: N/A;  . PARTIAL HYSTERECTOMY  1978  . SHOULDER SURGERY  Left shoulder for RTC;left arm surgery '98/left hand surgery 2001  . TOTAL ABDOMINAL HYSTERECTOMY W/ BILATERAL SALPINGOOPHORECTOMY  2002  . UMBILICAL HERNIA REPAIR  2008      Social History:      Social History   Tobacco Use  . Smoking status: Former Smoker    Packs/day: 0.50    Years: 40.00    Pack years: 20.00    Types: Cigarettes    Quit date: 08/30/2017    Years since quitting: 2.3  . Smokeless tobacco: Never Used  . Tobacco comment: 1/2 pack per day  Substance Use Topics  . Alcohol use: No       Family History :     Family History  Problem Relation Age of Onset  . Depression Mother   . Bipolar disorder Mother   . Dementia Mother   . Pulmonary fibrosis Mother   . Alcohol abuse Father   . Aneurysm Father        deceased age 30, brain  . Colon cancer Paternal Grandfather        age greater than 70      Home Medications:   Prior to Admission medications   Medication Sig Start Date End Date  Taking? Authorizing Provider  albuterol (PROVENTIL HFA;VENTOLIN HFA) 108 (90 BASE) MCG/ACT inhaler Inhale 2 puffs into the lungs every 4 (four) hours as needed for wheezing or shortness of breath. 10/02/15  Yes Horton, Barbette Hair, MD  cilostazol (PLETAL) 100 MG tablet Take 1 tablet (100 mg total) by mouth 2 (two) times daily before a meal. 11/10/19  Yes Early, Arvilla Meres, MD  LANTUS SOLOSTAR 100 UNIT/ML Solostar Pen Inject 40 Units into the skin daily.  10/19/19  Yes [provider]  levothyroxine (SYNTHROID) 125 MCG tablet Take 125 mcg by mouth daily. 12/15/19  Yes [provider]  lisinopril (PRINIVIL,ZESTRIL) 20 MG tablet Take 20 mg by mouth daily.     Yes [provider]  LORazepam (ATIVAN) 1 MG tablet Take 1 mg by mouth 2 (two) times daily.  10/21/19  Yes [provider]  metFORMIN (GLUCOPHAGE) 500 MG tablet Take 500 mg by mouth 2 (two) times daily with a meal.  09/18/19  Yes [provider]  pantoprazole (PROTONIX) 40 MG tablet Take 40 mg by mouth daily. 09/29/19  Yes [provider]  PARoxetine (PAXIL) 20 MG tablet Take 20 mg by mouth daily.  09/29/19  Yes [provider]  pravastatin (PRAVACHOL) 40 MG tablet Take 40 mg by mouth daily.     Yes [provider]  pregabalin (LYRICA) 75 MG capsule Take 75 mg by mouth daily.  10/19/19  Yes [provider]  traZODone (DESYREL) 100 MG tablet Take 1 tablet by mouth at bedtime. 08/19/15  Yes [provider]  oxyCODONE-acetaminophen (PERCOCET/ROXICET) 5-325 MG tablet Take 1-2 tablets by mouth every 4 (four) hours as needed for severe pain. 09/20/15   Nat Christen, MD     Allergies:     Allergies  Allergen Reactions  . Iohexol      Desc: CHEST TIGHTNESS,BRETHING PROBLEMS NEEDS PRE MEDS   . Tape Other (See Comments)    Tears skin  . Ciprofloxacin Rash  . Latex Rash    GLOVES   . Penicillins Rash  . Povidone-Iodine Rash     Physical Exam:   Vitals  Blood pressure  (!) 143/70, pulse 76, temperature 98.8 F (37.1 C), temperature source Oral, resp. rate 16, height 5' 6"  (1.676 m), weight 83  kg, SpO2 90 %.  1.  General: Appears in no acute distress  2. Psychiatric: Alert, oriented x3, intact insight and judgment  3. Neurologic: Cranial nerves II through XII grossly intact, no focal deficit noted  4. HEENMT:  Atraumatic normocephalic, extraocular muscles are intact  5. Respiratory : Bilateral wheezing auscultated  6. Cardiovascular : S1-S2, regular, no murmur auscultated  7. Gastrointestinal:  Abdomen is soft, nontender, no organomegaly  8. Skin:  No rashes noted      Data Review:    CBC Recent Labs  Lab 12/19/19 1710  WBC 6.5  HGB 8.1*  HCT 27.8*  PLT 246  MCV 89.1  MCH 26.0  MCHC 29.1*  RDW 16.7*  LYMPHSABS 2.0  MONOABS 0.5  EOSABS 0.2  BASOSABS 0.0   ------------------------------------------------------------------------------------------------------------------  Results for orders placed or performed during the hospital encounter of 12/19/19 (from the past 48 hour(s))  POC CBG, ED     Status: Abnormal   Collection Time: 12/19/19  4:59 PM  Result Value Ref Range   Glucose-Capillary 121 (H) 70 - 99 mg/dL    Comment: Glucose reference range applies only to samples taken after fasting for at least 8 hours.  CBC with Differential     Status: Abnormal   Collection Time: 12/19/19  5:10 PM  Result Value Ref Range   WBC 6.5 4.0 - 10.5 K/uL   RBC 3.12 (L) 3.87 - 5.11 MIL/uL   Hemoglobin 8.1 (L) 12.0 - 15.0 g/dL   HCT 27.8 (L) 36.0 - 46.0 %   MCV 89.1 80.0 - 100.0 fL   MCH 26.0 26.0 - 34.0 pg   MCHC 29.1 (L) 30.0 - 36.0 g/dL   RDW 16.7 (H) 11.5 - 15.5 %   Platelets 246 150 - 400 K/uL   nRBC 0.0 0.0 - 0.2 %   Neutrophils Relative % 60 %   Neutro Abs 3.9 1.7 - 7.7 K/uL   Lymphocytes Relative 30 %   Lymphs Abs 2.0 0.7 - 4.0 K/uL   Monocytes Relative 7 %   Monocytes Absolute 0.5 0.1 - 1.0 K/uL   Eosinophils Relative  3 %   Eosinophils Absolute 0.2 0.0 - 0.5 K/uL   Basophils Relative 0 %   Basophils Absolute 0.0 0.0 - 0.1 K/uL   Immature Granulocytes 0 %   Abs Immature Granulocytes 0.02 0.00 - 0.07 K/uL    Comment: Performed at [facility], [address], [place], [place] [address]  Comprehensive metabolic panel     Status: Abnormal   Collection Time: 12/19/19  5:10 PM  Result Value Ref Range   Sodium 133 (L) 135 - 145 mmol/L   Potassium 5.2 (H) 3.5 - 5.1 mmol/L   Chloride 102 98 - 111 mmol/L   CO2 24 22 - 32 mmol/L   Glucose, Bld 123 (H) 70 - 99 mg/dL    Comment: Glucose reference range applies only to samples taken after fasting for at least 8 hours.   BUN 19 8 - 23 mg/dL   Creatinine, Ser 1.23 (H) 0.44 - 1.00 mg/dL   Calcium 9.2 8.9 - 10.3 mg/dL   Total Protein 7.5 6.5 - 8.1 g/dL   Albumin 3.6 3.5 - 5.0 g/dL   AST 15 15 - 41 U/L   ALT 13 0 - 44 U/L   Alkaline Phosphatase 61 38 - 126 U/L   Total Bilirubin 0.5 0.3 - 1.2 mg/dL   GFR calc non Af Amer 45 (L) >60 mL/min   GFR calc Af  Amer 52 (L) >60 mL/min   Anion gap 7 5 - 15    Comment: Performed at [facility], [address] [place], [place] [address]  Brain natriuretic peptide     Status: Abnormal   Collection Time: 12/19/19  5:10 PM  Result Value Ref Range   B Natriuretic Peptide 417.0 (H) 0.0 - 100.0 pg/mL    Comment: Performed at [facility], [address], [place], [place] [address]  Troponin I (High Sensitivity)     Status: Abnormal   Collection Time: 12/19/19  5:10 PM  Result Value Ref Range   Troponin I (High Sensitivity) 1,593 (HH) <18 ng/L    Comment: CRITICAL RESULT CALLED TO, READ BACK BY AND VERIFIED WITH: MOORE,M @ 1804 ON 12/19/19 BY JUW Performed at [facility], [address] [place], [place] [address]   D-dimer, quantitative (not at [facility])     Status: Abnormal   Collection Time: 12/19/19  5:10 PM  Result Value Ref Range   D-Dimer, Quant 1.18 (H) 0.00 - 0.50 ug/mL-FEU    Comment: (NOTE) At the manufacturer cut-off  of 0.50 ug/mL FEU, this assay has been documented to exclude PE with a sensitivity and negative predictive value of 97 to 99%.  At this time, this assay has not been approved by the FDA to exclude DVT/VTE. Results should be correlated with clinical presentation. Performed at [facility], [address], [place], [place] [address]   POC SARS Coronavirus 2 Ag-ED -     Status: None   Collection Time: 12/19/19  5:41 PM  Result Value Ref Range   SARS Coronavirus 2 Ag NEGATIVE NEGATIVE    Comment: (NOTE) SARS-CoV-2 antigen NOT DETECTED.  Negative results are presumptive.  Negative results do not preclude SARS-CoV-2 infection and should not be used as the sole basis for treatment or other patient management decisions, including infection  control decisions, particularly in the presence of clinical signs and  symptoms consistent with COVID-19, or in those who have been in contact with the virus.  Negative results must be combined with clinical observations, patient history, and epidemiological information. The expected result is Negative. Fact Sheet for Patients: PodPark.tn Fact Sheet for Healthcare Providers: GiftContent.is This test is not yet approved or cleared by the [place] FDA and  has been authorized for detection and/or diagnosis of SARS-CoV-2 by FDA under an Emergency Use Authorization (EUA).  This EUA will remain in effect (meaning this test can be used) for the duration of  the COVID-19 de claration under Section 564(b)(1) of the Act, 21 U.S.C. section 360bbb-3(b)(1), unless the authorization is terminated or revoked sooner.   Troponin I (High Sensitivity)     Status: Abnormal   Collection Time: 12/19/19  7:11 PM  Result Value Ref Range   Troponin I (High Sensitivity) 1,584 (HH) <18 ng/L    Comment: CRITICAL VALUE NOTED.  VALUE IS CONSISTENT WITH PREVIOUSLY REPORTED AND CALLED VALUE. Performed at [facility], [address], [place], [place] [address]   Respiratory Panel by RT PCR (Flu A&B, Covid) - Nasopharyngeal Swab     Status: None   Collection Time: 12/20/19 12:30 AM   Specimen: Nasopharyngeal Swab  Result Value Ref Range   SARS Coronavirus 2 by RT PCR NEGATIVE NEGATIVE    Comment: (NOTE) SARS-CoV-2 target nucleic acids are NOT DETECTED. The SARS-CoV-2 RNA is generally detectable in upper respiratoy specimens during the acute phase of infection. The lowest concentration of SARS-CoV-2 viral copies this assay can detect is  131 copies/mL. A negative result does not preclude SARS-Cov-2 infection and should not be used as the sole basis for treatment or other patient management decisions. A negative result may occur with  improper specimen collection/handling, submission of specimen other than nasopharyngeal swab, presence of viral mutation(s) within the areas targeted by this assay, and inadequate number of viral copies (<131 copies/mL). A negative result must be combined with clinical observations, patient history, and epidemiological information. The expected result is Negative. Fact Sheet for Patients:  PinkCheek.be Fact Sheet for Healthcare Providers:  GravelBags.it This test is not yet ap proved or cleared by the [place] FDA and  has been authorized for detection and/or diagnosis of SARS-CoV-2 by FDA under an Emergency Use Authorization (EUA). This EUA will remain  in effect (meaning this test can be used) for the duration of the COVID-19 declaration under Section 564(b)(1) of the Act, 21 U.S.C. section 360bbb-3(b)(1), unless the authorization is terminated or revoked sooner.    Influenza A by PCR NEGATIVE NEGATIVE   Influenza B by PCR NEGATIVE NEGATIVE    Comment: (NOTE) The Xpert Xpress SARS-CoV-2/FLU/RSV assay is intended as an aid in  the diagnosis of influenza from Nasopharyngeal swab specimens and  should  not be used as a sole basis for treatment. Nasal washings and  aspirates are unacceptable for Xpert Xpress SARS-CoV-2/FLU/RSV  testing. Fact Sheet for Patients: PinkCheek.be Fact Sheet for Healthcare Providers: GravelBags.it This test is not yet approved or cleared by the [place] FDA and  has been authorized for detection and/or diagnosis of SARS-CoV-2 by  FDA under an Emergency Use Authorization (EUA). This EUA will remain  in effect (meaning this test can be used) for the duration of the  Covid-19 declaration under Section 564(b)(1) of the Act, 21  U.S.C. section 360bbb-3(b)(1), unless the authorization is  terminated or revoked. Performed at [facility], [address], [place], [place] [address]     Chemistries  Recent Labs  Lab 12/19/19 1710  NA 133*  K 5.2*  CL 102  CO2 24  GLUCOSE 123*  BUN 19  CREATININE 1.23*  CALCIUM 9.2  AST 15  ALT 13  ALKPHOS 61  BILITOT 0.5   ------------------------------------------------------------------------------------------------------------------  ------------------------------------------------------------------------------------------------------------------ GFR: Estimated Creatinine Clearance: 47.5 mL/min (A) (by C-G formula based on SCr of 1.23 mg/dL (H)). Liver Function Tests: Recent Labs  Lab 12/19/19 1710  AST 15  ALT 13  ALKPHOS 61  BILITOT 0.5  PROT 7.5  ALBUMIN 3.6   CBG: Recent Labs  Lab 12/19/19 1659  GLUCAP 121*    --------------------------------------------------------------------------------------------------------------- Urine analysis:    Component Value Date/Time   COLORURINE YELLOW 09/20/2015 1550   APPEARANCEUR CLEAR 09/20/2015 1550   LABSPEC 1.025 09/20/2015 1550   PHURINE 5.5 09/20/2015 1550   GLUCOSEU NEGATIVE 09/20/2015 1550   HGBUR TRACE (A) 09/20/2015 1550   BILIRUBINUR NEGATIVE 09/20/2015 1550   KETONESUR NEGATIVE  09/20/2015 1550   PROTEINUR >300 (A) 09/20/2015 1550   UROBILINOGEN 0.2 09/04/2014 1841   NITRITE NEGATIVE 09/20/2015 1550   LEUKOCYTESUR NEGATIVE 09/20/2015 1550      Imaging Results:    CT Chest Wo Contrast  Result Date: 12/19/2019 CLINICAL DATA:  69 year old female with shortness of breath and dizziness. EXAM: CT CHEST WITHOUT CONTRAST TECHNIQUE: Multidetector CT imaging of the chest was performed following the standard protocol without IV contrast. COMPARISON:  Chest radiograph dated 12/19/2019. FINDINGS: Evaluation of this exam is limited in the absence of intravenous contrast. Cardiovascular: There is no cardiomegaly or pericardial  effusion. There is 3 vessel coronary vascular calcification. There is hypoattenuation of the cardiac blood pool suggestive of a degree of anemia. Clinical correlation is recommended. Moderate atherosclerotic calcification of the thoracic aorta. The central pulmonary arteries are grossly unremarkable. Mediastinum/Nodes: Mildly enlarged bilateral hilar and mediastinal lymph nodes measure approximately 14 mm in short axis in the prevascular space and 18 mm in short axis in the subcarinal region. Evaluation of the hilar lymph nodes is limited in the absence of intravenous contrast. There is however fullness of the hilar region likely combination of vascular confluence and enlarged lymph nodes. There is an apparent right hilar adenopathy measuring 14 mm in short axis. The esophagus and the thyroid gland are grossly unremarkable. No mediastinal fluid collection. Lungs/Pleura: There are small bilateral pleural effusions, right greater than left. There is diffuse interstitial and interlobular septal prominence consistent with mild edema. There is background of chronic interstitial coarsening. There is a 4.6 x 4.0 cm focal area of consolidation in the right middle lobe corresponding to the opacity seen on the earlier radiograph. This likely represent pneumonia. However, a mass or  malignancy is not excluded. Clinical correlation and close follow-up after treatment and resolution of acute symptoms recommended. There is no pneumothorax. The central airways are patent. Upper Abdomen: Cirrhosis. Cholecystectomy. Small ascites. Musculoskeletal: Degenerative changes of the spine. No acute osseous pathology. IMPRESSION: 1. Focal consolidation in the right middle lobe most consistent with pneumonia. A mass or malignancy is not excluded. Clinical correlation and close follow-up after treatment and resolution of acute symptoms recommended. 2. Small bilateral pleural effusions and findings of interstitial edema. 3. Mildly enlarged bilateral hilar and mediastinal lymph nodes, likely reactive. 4. Cirrhosis with small ascites. 5. Aortic Atherosclerosis (ICD10-I70.0). Electronically Signed   By: Anner Crete M.D.   On: 12/19/2019 18:23   CT Angio Chest PE W/Cm &/Or Wo Cm  Result Date: 12/19/2019 CLINICAL DATA:  Shortness of breath. EXAM: CT ANGIOGRAPHY CHEST WITH CONTRAST TECHNIQUE: Multidetector CT imaging of the chest was performed using the standard protocol during bolus administration of intravenous contrast. Multiplanar CT image reconstructions and MIPs were obtained to evaluate the vascular anatomy. CONTRAST:  35m OMNIPAQUE IOHEXOL 350 MG/ML SOLN Patient pre-medicated due to reported contrast allergy. COMPARISON:  Noncontrast chest CT earlier this day. FINDINGS: Cardiovascular: There are no filling defects within the pulmonary arteries to suggest pulmonary embolus. Aortic atherosclerosis without dissection. No pericardial effusion. Mediastinum/Nodes: Again seen bilateral hilar and mediastinal adenopathy. Representative prevascular node measures 14 mm short axis, subcarinal node measures 17 mm short axis. Enlarged right hilar node measures 17 mm short axis. Prominent left hilar nodes to a lesser extent. No esophageal wall thickening. No thyroid nodule. Lungs/Pleura: Small bilateral pleural  effusions, right greater than left, unchanged from earlier this day. Interstitial and septal thickening, basilar and subpleural predominance, also unchanged. Focal consolidation in the right middle lobe measuring 4.2 x 3.8 cm abutting the fissure, unchanged. Mild central bronchial thickening. Minimal emphysema. Upper Abdomen: Nodular hepatic contours consistent with cirrhosis. Cholecystectomy. Atherosclerosis of the upper abdominal aorta. Minimal perihepatic ascites again seen. Musculoskeletal: There are no acute or suspicious osseous abnormalities. Again seen degenerative change of the spine. Review of the MIP images confirms the above findings. IMPRESSION: 1. No pulmonary embolus. 2. Exam is otherwise unchanged from noncontrast CT earlier this day. Focal masslike consolidation in the right middle lobe which may represent pneumonia, however radiographic follow-up recommended to document resolution and exclude underlying neoplasm. 3. Small pleural effusions and findings of pulmonary edema.  Enlarged mediastinal and hilar lymph nodes, likely reactive but nonspecific in the setting. 4. Cirrhosis. Minimal perihepatic ascites. Aortic Atherosclerosis (ICD10-I70.0). Electronically Signed   By: Keith Rake M.D.   On: 12/19/2019 23:01   DG Chest Port 1 View  Result Date: 12/19/2019 CLINICAL DATA:  69 year old female with shortness of breath. EXAM: PORTABLE CHEST 1 VIEW COMPARISON:  Chest radiograph dated 10/02/2015. FINDINGS: There is mild cardiomegaly and mild vascular congestion. Background of chronic interstitial coarsening and mild bronchitic changes. Faint diffuse bilateral peripheral and subpleural hazy densities may represent chronic changes or mild edema. Atypical infiltrate is not excluded. Clinical correlation is recommended. There is a 2.8 x 5.0 cm ovoid opacity in the right mid lung field which may represent a round atelectasis, infiltrate, or loculated fluid within the fissure. A mass is not excluded.  Clinical correlation and close follow-up after treatment and resolution of acute symptoms or further evaluation with CT is recommended. There is no pleural effusion or pneumothorax. Atherosclerotic calcification of the aorta. No acute osseous pathology. IMPRESSION: 1. Cardiomegaly with mild vascular congestion. 2. Ovoid opacity in the right mid lung field may represent round atelectasis, infiltrate, or loculated fluid within the fissure. Lung mass is not excluded. Clinical correlation and close follow-up after treatment and resolution of acute symptoms or further evaluation with CT is recommended. Electronically Signed   By: Anner Crete M.D.   On: 12/19/2019 17:13    My personal review of EKG: Rhythm NSR, T wave inversion in lead I and aVL   Assessment & Plan:    Active Problems:   Chest pain   1. Chest pain-patient has mild elevation of troponin, cardiology was consulted by ED provider.  Patient has seen Dr. Einar Gip in the past. Dr Virgina Jock recommended patient to be transferred to [facility].  EKG shows T wave inversion in lead I and aVL.  Chest pain has resolved at this time.  He does not feel that patient has acute coronary syndrome, likely from demand ischemia. 2. Congestive heart failure-patient's BNP is elevated to 417.  Will give 1 dose of Lasix 40 mg IV.  Will obtain echocardiogram in a.m. 3. Community-acquired pneumonia-CT chest shows focal masslike lesion in the right middle lobe.  Consistent with community-acquired pneumonia.  Patient started on IV ceftriaxone and Zithromax.  She will need repeat imaging studies after completion of treatment to rule out malignancy. 4. COPD exacerbation-patient has bilateral wheezing, will start Solu-Medrol 60 mg IV every 6 hours, DuoNeb nebulizer every 6 hours. 5. Diabetes mellitus type 2-we will hold Metformin, start Lantus 40 units subcu daily, sliding scale insulin with NovoLog. 6. Hypothyroidism-continue Synthroid.   DVT Prophylaxis-    Lovenox   AM Labs Ordered, also please review Full Orders  Family Communication: Admission, patients condition and plan of care including tests being ordered have been discussed with the patient who indicate understanding and agree with the plan and Code Status.  Code Status: Full code  Admission status: Inpatient :The appropriate admission status for this patient is INPATIENT. Inpatient status is judged to be reasonable and necessary in order to provide the required intensity of service to ensure the patient's safety. The patient's presenting symptoms, physical exam findings, and initial radiographic and laboratory data in the context of their chronic comorbidities is felt to place them at high risk for further clinical deterioration. Furthermore, it is not anticipated that the patient will be medically stable for discharge from the hospital within 2 midnights of admission. The following factors support  the admission status of inpatient.     The patient's presenting symptoms include shortness of breath, chest pain. The initial radiographic and laboratory data are worrisome because of community-acquired pneumonia The chronic co-morbidities include diabetes mellitus type 2, hypertension.       * I certify that at the point of admission it is my clinical judgment that the patient will require inpatient hospital care spanning beyond 2 midnights from the point of admission due to high intensity of service, high risk for further deterioration and high frequency of surveillance required.*  Time spent in minutes : 60 minutes   Oluwaseyi Tull S Kemari Narez M.D

## 2019-12-20 NOTE — ED Notes (Signed)
Pt reports to [facility] that she is having 7/10 chest pain. Spoke with DR Dyann Kief. Ordered nitro SL x1. [facility] will administer

## 2019-12-20 NOTE — ED Notes (Signed)
Pt ambulated to [facility]. Pt looking for dentures. Dentures found in [employer] bag wrapped in napkin in the trash can. Pt given denture cup

## 2019-12-20 NOTE — ED Notes (Signed)
Spoke with Ruby at [facility] to arrange for transportation to [facility].

## 2019-12-20 NOTE — ED Notes (Signed)
Date and time results received: 12/20/19 0602(use smartphrase ".now" to insert current time)  Test: troponin Critical Value: 941  Name of Provider Notified: Dr Dyann Kief  Orders Received? Or Actions Taken?: see chart

## 2019-12-20 NOTE — ED Notes (Signed)
Hospitalist at bedside 

## 2019-12-20 NOTE — ED Notes (Signed)
Pt provided bedside commode

## 2019-12-20 NOTE — Consult Note (Signed)
CARDIOLOGY CONSULT NOTE  Patient ID: [name] MRN: [id number] DOB/AGE: December 18, 1950 69 y.o.  Admit date: 12/19/2019 Referring Physician: [facility] hospitalist Reason for Consultation: Chest pain, troponin elevation  HPI:   69 y.o. Caucasian female  with nonobstructive coronary artery disease (cath 2010), controlled hypertension, type 2 diabetes mellitus, hyperlipidemia, former smoker, hypothyroidism, COPD/asthma, depression, admitted with chest pain, shortness of breath.  Patient lives with her 3 dogs, does not have any family members.  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  She had been doing well up until 2 weeks ago, when she started developing right-sided sharp, stabbing chest pain, associated with shortness of breath.  Pain would improve with taking Tums, but worsened with physical exertion.  She also reports pain under lower part of her sternum.  Shortness of breath worse with physical exertion.  She denies any orthopnea, PND, leg edema symptoms.  She has had clear productive cough over the last few days.  She also reports dark-colored stools for last 2 days.  She denies any prior history of gastric ulcers.  Patient presented to [facility] emergency department on 12/19/2019.  Work-up there showed high-sensitivity troponin elevated up to 1500, BNP 417, anemia with hemoglobin around 8 g/DL, focal right middle lobe lung opacity.  Since arrival to [facility], she has not had any chest pain.  Her breathing is improved, although she reports having wheezing.  Patient underwent cardiac catheterization by my partner Dr. Einar Gip in 2010.  Since then, she has not seen a cardiologist.  She reportedly had a normal stress test in November 2020 with her PCP.  She states that she had a bare-metal stent several years ago, however no clear evidence of was seen on her last catheterization in 2010.  She had her last colonoscopy in 4 years,  reportedly normal for the patient.  Past Medical History:  Diagnosis Date  . Allergic rhinitis   . 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 ASCVD (arteriosclerotic cardiovascular disease)    MI in 96 requiring BMS CX; DES to M1 in 2000;normal coronary angiography in 2004  . Cholelithiasis   . COPD (chronic obstructive pulmonary disease) ([place])   . DDD (degenerative disc disease), lumbar   . Depression   . Diabetes mellitus   . Diabetes mellitus type I ([place])   . DVT (deep venous thrombosis) ([place])   . GERD (gastroesophageal reflux disease)   . Hyperlipidemia   . Hypothyroidism   . Low back pain   . Nephrolithiasis 2006   stone extraction   . Peripheral neuropathy   . Sciatic pain    right  . Tobacco abuse   . Tremor      Past Surgical History:  Procedure Laterality Date  . BACK SURGERY    . CHOLECYSTECTOMY    . COLONOSCOPY  01/2009   HBZ:JIRCVE rectum/repeat in 5 yrs  . COLONOSCOPY N/A 04/29/2014   Dr.Rourk- attempted/incomplete colonoscopy. inadequate prep  . COLONOSCOPY N/A 05/27/2014   LFY:BOFBPZWCH coli. Colonic polyps-removed as described above.Status post segmental biopsy  . DILATION AND CURETTAGE OF UTERUS  1974  . ESOPHAGOGASTRODUODENOSCOPY  05/2010   Dr. Tessie Fass, erosion. 15F dilation  . ESOPHAGOGASTRODUODENOSCOPY N/A 04/29/2014   Dr.Rourk- normal esophagus s/p passage of maloney dilator. small hiatal hernia- bx= chronic inflammation.  Marland Kitchen HAND AMPUTATION Right    traumatic  . MALONEY DILATION N/A 04/29/2014   Procedure: Venia Minks DILATION;  Surgeon: Daneil Dolin,  MD;  Location: [facility]O SUITE;  Service: Endoscopy;  Laterality: N/A;  . PARTIAL HYSTERECTOMY  1978  . SHOULDER SURGERY     Left shoulder for RTC;left arm surgery '98/left hand surgery 2001  . TOTAL ABDOMINAL HYSTERECTOMY W/ BILATERAL SALPINGOOPHORECTOMY  2002  . UMBILICAL HERNIA REPAIR  2008     Family History  Problem Relation Age of Onset  . Depression Mother   . Bipolar disorder Mother   .  Dementia Mother   . Pulmonary fibrosis Mother   . Alcohol abuse Father   . Aneurysm Father        deceased age 79, brain  . Colon cancer Paternal Grandfather        age greater than 31     Social History: Social History   Socioeconomic History  . Marital status: Legally Separated    Spouse name: Not on file  . Number of children: Not on file  . Years of education: Not on file  . Highest education level: Not on file  Occupational History  . 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 Smoking status: Former Smoker    Packs/day: 0.50    Years: 40.00    Pack years: 20.00    Types: Cigarettes    Quit date: 08/30/2017    Years since quitting: 2.3  . Smokeless tobacco: Never Used  . Tobacco comment: 1/2 pack per day  Substance and Sexual Activity  . Alcohol use: No  . Drug use: No  . Sexual activity: Never  Other Topics Concern  . Not on file  Social History Narrative   Married x3   Lives with husband   GED in 2005   Social Determinants of Health   Financial Resource Strain:   . Difficulty of Paying Living Expenses: Not on file  Food Insecurity:   . Worried About Charity fundraiser in the Last Year: Not on file  . Ran Out of Food in the Last Year: Not on file  Transportation Needs:   . Lack of Transportation (Medical): Not on file  . Lack of Transportation (Non-Medical): Not on file  Physical Activity:   . Days of Exercise per Week: Not on file  . Minutes of Exercise per Session: Not on file  Stress:   . Feeling of Stress : Not on file  Social Connections:   . Frequency of Communication with Friends and Family: Not on file  . Frequency of Social Gatherings with Friends and Family: Not on file  . Attends Religious Services: Not on file  . Active Member of Clubs or Organizations: Not on file  . Attends Archivist Meetings: Not on file  . Marital Status: Not on file  Intimate Partner  Violence:   . Fear of Current or Ex-Partner: Not on file  . Emotionally Abused: Not on file  . Physically Abused: Not on file  . Sexually Abused: Not on file     Medications Prior to Admission  Medication Sig Dispense Refill Last Dose  . albuterol (PROVENTIL HFA;VENTOLIN HFA) 108 (90 BASE) MCG/ACT inhaler Inhale 2 puffs into the lungs every 4 (four) hours as needed for wheezing or shortness of breath. 1 Inhaler 0 12/19/2019 at Unknown time  . cilostazol (PLETAL) 100 MG tablet Take 1 tablet (100 mg total) by mouth 2 (two) times daily before a meal. 60 tablet 11 12/19/2019 at Unknown time  . LANTUS SOLOSTAR 100  UNIT/ML Solostar Pen Inject 40 Units into the skin daily.    12/18/2019 at Unknown time  . levothyroxine (SYNTHROID) 125 MCG tablet Take 125 mcg by mouth daily.   12/19/2019 at Unknown time  . lisinopril (PRINIVIL,ZESTRIL) 20 MG tablet Take 20 mg by mouth daily.     12/19/2019 at Unknown time  . LORazepam (ATIVAN) 1 MG tablet Take 1 mg by mouth 2 (two) times daily.    Past Week at Unknown time  . metFORMIN (GLUCOPHAGE) 500 MG tablet Take 500 mg by mouth 2 (two) times daily with a meal.    12/19/2019 at Unknown time  . pantoprazole (PROTONIX) 40 MG tablet Take 40 mg by mouth daily.   12/19/2019 at Unknown time  . PARoxetine (PAXIL) 20 MG tablet Take 20 mg by mouth daily.    12/19/2019 at Unknown time  . pravastatin (PRAVACHOL) 40 MG tablet Take 40 mg by mouth daily.     12/18/2019 at Unknown time  . pregabalin (LYRICA) 75 MG capsule Take 75 mg by mouth daily.    12/19/2019 at Unknown time  . traZODone (DESYREL) 100 MG tablet Take 1 tablet by mouth at bedtime.   12/18/2019 at Unknown time  . oxyCODONE-acetaminophen (PERCOCET/ROXICET) 5-325 MG tablet Take 1-2 tablets by mouth every 4 (four) hours as needed for severe pain. 12 tablet 0     Review of Systems  Constitution: Negative for decreased appetite, malaise/fatigue, weight gain and weight loss.  HENT: Negative for congestion.   Eyes: Negative for visual  disturbance.  Cardiovascular: Positive for chest pain (Currently absent.  As per HPI.). Negative for dyspnea on exertion, leg swelling, palpitations and syncope.  Respiratory: Positive for shortness of breath (Currently absent) and wheezing. Negative for cough.   Endocrine: Negative for cold intolerance.  Hematologic/Lymphatic: Does not bruise/bleed easily.  Skin: Negative for itching and rash.  Musculoskeletal: Negative for myalgias.  Gastrointestinal: Negative for nausea and vomiting.  Genitourinary: Negative for dysuria.  Neurological: Negative for dizziness and weakness.  Psychiatric/Behavioral: The patient is not nervous/anxious.   All other systems reviewed and are negative.     Physical Exam: Physical Exam  Constitutional: She is oriented to person, place, and time. She appears well-developed and well-nourished. No distress.  HENT:  Head: Normocephalic and atraumatic.  Eyes: Pupils are equal, round, and reactive to light. Conjunctivae are normal.  Neck: No JVD present.  Cardiovascular: Normal rate, regular rhythm and intact distal pulses.  Pulmonary/Chest: Effort normal. She has wheezes. She has no rales. She exhibits tenderness (Lower retrosternal tenderness).  Abdominal: Soft. Bowel sounds are normal. There is no abdominal tenderness. There is no rebound.  Musculoskeletal:        General: No edema.     Comments: Right below wrist amputation  Lymphadenopathy:    She has no cervical adenopathy.  Neurological: She is alert and oriented to person, place, and time. No cranial nerve deficit.  Skin: Skin is warm and dry.  Psychiatric: She has a normal mood and affect.  Nursing note and vitals reviewed.    Labs:   Lab Results  Component Value Date   WBC 6.2 12/20/2019   HGB 7.7 (L) 12/20/2019   HCT 26.1 (L) 12/20/2019   MCV 89.7 12/20/2019   PLT 231 12/20/2019    Recent Labs  Lab 12/20/19 0437  NA 133*  K 5.7*  CL 102  CO2 23  BUN 23  CREATININE 1.34*  CALCIUM  8.8*  PROT 7.1  BILITOT 0.3  ALKPHOS 61  ALT 12  AST 15  GLUCOSE 196*    Lipid Panel     Component Value Date/Time   CHOL 142 05/30/2009 2042   TRIG 189 (H) 05/30/2009 2042   HDL 36 (L) 05/30/2009 2042   CHOLHDL 3.9 Ratio 05/30/2009 2042   VLDL 38 05/30/2009 2042   LDLCALC 68 05/30/2009 2042    BNP (last 3 results) Recent Labs    12/19/19 1710  BNP 417.0*    Results for [name], [name] (MRN [id number]) as of 12/20/2019 10:38  Ref. Range 12/19/2019 17:10 12/19/2019 19:11 12/20/2019 04:37  Troponin I (High Sensitivity) Latest Ref Range: <18 ng/L 1,593 (HH) 1,584 (HH) 941 (HH)    HEMOGLOBIN A1C Lab Results  Component Value Date   HGBA1C 8.7 (H) 05/03/2012   MPG 203 (H) 05/03/2012     Radiology: CT Chest Wo Contrast  Result Date: 12/19/2019 CLINICAL DATA:  69 year old female with shortness of breath and dizziness. EXAM: CT CHEST WITHOUT CONTRAST TECHNIQUE: Multidetector CT imaging of the chest was performed following the standard protocol without IV contrast. COMPARISON:  Chest radiograph dated 12/19/2019. FINDINGS: Evaluation of this exam is limited in the absence of intravenous contrast. Cardiovascular: There is no cardiomegaly or pericardial effusion. There is 3 vessel coronary vascular calcification. There is hypoattenuation of the cardiac blood pool suggestive of a degree of anemia. Clinical correlation is recommended. Moderate atherosclerotic calcification of the thoracic aorta. The central pulmonary arteries are grossly unremarkable. Mediastinum/Nodes: Mildly enlarged bilateral hilar and mediastinal lymph nodes measure approximately 14 mm in short axis in the prevascular space and 18 mm in short axis in the subcarinal region. Evaluation of the hilar lymph nodes is limited in the absence of intravenous contrast. There is however fullness of the hilar region likely combination of vascular confluence and enlarged lymph nodes. There is an apparent right hilar adenopathy measuring 14  mm in short axis. The esophagus and the thyroid gland are grossly unremarkable. No mediastinal fluid collection. Lungs/Pleura: There are small bilateral pleural effusions, right greater than left. There is diffuse interstitial and interlobular septal prominence consistent with mild edema. There is background of chronic interstitial coarsening. There is a 4.6 x 4.0 cm focal area of consolidation in the right middle lobe corresponding to the opacity seen on the earlier radiograph. This likely represent pneumonia. However, a mass or malignancy is not excluded. Clinical correlation and close follow-up after treatment and resolution of acute symptoms recommended. There is no pneumothorax. The central airways are patent. Upper Abdomen: Cirrhosis. Cholecystectomy. Small ascites. Musculoskeletal: Degenerative changes of the spine. No acute osseous pathology. IMPRESSION: 1. Focal consolidation in the right middle lobe most consistent with pneumonia. A mass or malignancy is not excluded. Clinical correlation and close follow-up after treatment and resolution of acute symptoms recommended. 2. Small bilateral pleural effusions and findings of interstitial edema. 3. Mildly enlarged bilateral hilar and mediastinal lymph nodes, likely reactive. 4. Cirrhosis with small ascites. 5. Aortic Atherosclerosis (ICD10-I70.0). Electronically Signed   By: Anner Crete M.D.   On: 12/19/2019 18:23   CT Angio Chest PE W/Cm &/Or Wo Cm  Result Date: 12/19/2019 CLINICAL DATA:  Shortness of breath. EXAM: CT ANGIOGRAPHY CHEST WITH CONTRAST TECHNIQUE: Multidetector CT imaging of the chest was performed using the standard protocol during bolus administration of intravenous contrast. Multiplanar CT image reconstructions and MIPs were obtained to evaluate the vascular anatomy. CONTRAST:  20m OMNIPAQUE IOHEXOL 350 MG/ML SOLN Patient pre-medicated due to reported contrast allergy. COMPARISON:  Noncontrast chest CT earlier this day.  FINDINGS:  Cardiovascular: There are no filling defects within the pulmonary arteries to suggest pulmonary embolus. Aortic atherosclerosis without dissection. No pericardial effusion. Mediastinum/Nodes: Again seen bilateral hilar and mediastinal adenopathy. Representative prevascular node measures 14 mm short axis, subcarinal node measures 17 mm short axis. Enlarged right hilar node measures 17 mm short axis. Prominent left hilar nodes to a lesser extent. No esophageal wall thickening. No thyroid nodule. Lungs/Pleura: Small bilateral pleural effusions, right greater than left, unchanged from earlier this day. Interstitial and septal thickening, basilar and subpleural predominance, also unchanged. Focal consolidation in the right middle lobe measuring 4.2 x 3.8 cm abutting the fissure, unchanged. Mild central bronchial thickening. Minimal emphysema. Upper Abdomen: Nodular hepatic contours consistent with cirrhosis. Cholecystectomy. Atherosclerosis of the upper abdominal aorta. Minimal perihepatic ascites again seen. Musculoskeletal: There are no acute or suspicious osseous abnormalities. Again seen degenerative change of the spine. Review of the MIP images confirms the above findings. IMPRESSION: 1. No pulmonary embolus. 2. Exam is otherwise unchanged from noncontrast CT earlier this day. Focal masslike consolidation in the right middle lobe which may represent pneumonia, however radiographic follow-up recommended to document resolution and exclude underlying neoplasm. 3. Small pleural effusions and findings of pulmonary edema. Enlarged mediastinal and hilar lymph nodes, likely reactive but nonspecific in the setting. 4. Cirrhosis. Minimal perihepatic ascites. Aortic Atherosclerosis (ICD10-I70.0). Electronically Signed   By: Keith Rake M.D.   On: 12/19/2019 23:01   DG Chest Port 1 View  Result Date: 12/19/2019 CLINICAL DATA:  69 year old female with shortness of breath. EXAM: PORTABLE CHEST 1 VIEW COMPARISON:  Chest  radiograph dated 10/02/2015. FINDINGS: There is mild cardiomegaly and mild vascular congestion. Background of chronic interstitial coarsening and mild bronchitic changes. Faint diffuse bilateral peripheral and subpleural hazy densities may represent chronic changes or mild edema. Atypical infiltrate is not excluded. Clinical correlation is recommended. There is a 2.8 x 5.0 cm ovoid opacity in the right mid lung field which may represent a round atelectasis, infiltrate, or loculated fluid within the fissure. A mass is not excluded. Clinical correlation and close follow-up after treatment and resolution of acute symptoms or further evaluation with CT is recommended. There is no pleural effusion or pneumothorax. Atherosclerotic calcification of the aorta. No acute osseous pathology. IMPRESSION: 1. Cardiomegaly with mild vascular congestion. 2. Ovoid opacity in the right mid lung field may represent round atelectasis, infiltrate, or loculated fluid within the fissure. Lung mass is not excluded. Clinical correlation and close follow-up after treatment and resolution of acute symptoms or further evaluation with CT is recommended. Electronically Signed   By: Anner Crete M.D.   On: 12/19/2019 17:13    Scheduled Meds: . arformoterol  15 mcg Nebulization BID  . aspirin EC  81 mg Oral Daily  . budesonide (PULMICORT) nebulizer solution  0.5 mg Nebulization BID  . cilostazol  100 mg Oral BID AC  . enoxaparin (LOVENOX) injection  40 mg Subcutaneous Q24H  . furosemide  20 mg Intravenous Once  . insulin glargine  40 Units Subcutaneous Daily  . ipratropium-albuterol  3 mL Nebulization Q6H  . levothyroxine  125 mcg Oral Q0600  . LORazepam  1 mg Oral BID  . methylPREDNISolone (SOLU-MEDROL) injection  60 mg Intravenous Q6H  . pantoprazole  40 mg Oral Daily  . PARoxetine  20 mg Oral Daily  . pravastatin  40 mg Oral q1800  . pregabalin  75 mg Oral Daily  . sodium chloride flush  3 mL Intravenous Q12H  . traZODone  100 mg Oral QHS   Continuous Infusions: . sodium chloride    . azithromycin    . cefTRIAXone (ROCEPHIN)  IV     PRN Meds:.sodium chloride, ondansetron **OR** ondansetron (ZOFRAN) IV, oxyCODONE-acetaminophen, sodium chloride flush  CARDIAC STUDIES:  EKG 12/20/2019: Sinus rhythm. Old anterior infarct. Lateral ischemia, slightly more prominent compared to previous EKG in 2016.  Echocardiogram: Pending  Coronary angiography 2012: 1. Mild luminal irregularity of the circumflex LAD and also the right     coronary artery.  Improvement in the flow through the left system     with intracoronary nitroglycerin administration. 2. Inferior wall ischemia could be an artifact from obesity and/or the     LAD not reaching the apex with inferior and inferoseptal defect     could be related to this along with apical thinning. 3. I do not see a clear-cut stent in the coronary arteries.  The     patient has history of myocardial infarction and a stent     implantation in 1996, and no medical records are available for this     in the hospital system.  No dictation was available.  Assessment & Recommendations:  69 y.o. Caucasian female  with nonobstructive coronary artery disease (cath 2010), controlled hypertension, type 2 diabetes mellitus, hyperlipidemia, former smoker, hypothyroidism, COPD/asthma, depression, admitted with chest pain, shortness of breath.  Chest pain, shortness of breath: Currently chest pain-free.  Maintaining sats without respiratory distress, in spite of wheezing. She has known coronary artery disease with multiple risk factors.  High-sensitivity troponin does have a trend of rise and fall suspicious for type I MI.  However, she has other comorbidities that could cause supply demand mismatch, including anemia, possible pneumonia.  She likely also has acute congestive heart failure, as reflected by elevated BNP. Echocardiogram is pending.  Management of possible acute coronary  syndrome is limited by her anemia with recent history of dark-colored stools.  Recommend GI evaluation to assess if therapeutic heparin can be used.  Consider PRBC transfusion given demand ischemia in the setting of coronary artery disease.  She will likely need ischemia evaluation at some point, once other comorbidities including anemia and pneumonia are addressed.  Nigel Mormon, MD 12/20/2019, 10:26 AM [facility] Pager: [phone] Office: [phone] If no answer Cell [phone]

## 2019-12-20 NOTE — Progress Notes (Addendum)
Patient is a 69 year old female with history of COPD, diabetes type 2, hyperlipidemia, hypothyroidism, prior history of DVT not on anticoagulation, anxiety, depression who presents at [facility] hospital with complaints of chest pain, shortness of breath for 3 days.  She complained of dyspnea on exertion.  She also complained of left-sided chest pain. In the emergency department, chest x-ray showed opacity in the right midlung field.  Chest CT confirmed pneumonia.  Started on antibiotics.  She was also found to have elevated troponin.  Cardiology was consulted by the emergency physician and transferred to [facility]. Her chest pain has currently resolved.  EKG had some T wave inversion in lead I and aVL.  She had troponin elevation which could be from demand ischemia.  She has known history of coronary artery disease with multiple risk factors. BNP was elevated to 417.  She was given IV Lasix.  Echocardiogram pending. Patient was also wheezing on presentation so she has been started on bronchodilators, steroids. Lab work showed normocytic anemia with hemoglobin in the range of 7.  She reported bloody bowel movement several months ago but recently her stools are brown and she has not noticed any change in the color.  We will check iron studies and get fecal occult blood test. Patient seen and examined at the bedside this afternoon.  Currently she is hemodynamically stable, on 2 L of oxygen per minute. She was also found to have hyperkalemia with potassium of 5.7.  We will give a dose of Lokelma. Patient lives by herself and is physically active.She does not have any family members but has a friend. Patient seen by my colleague Dr. Darrick Meigs this morning.  I agree with his assessment and plan.

## 2019-12-21 ENCOUNTER — Inpatient Hospital Stay (HOSPITAL_COMMUNITY): Payer: Medicare HMO

## 2019-12-21 DIAGNOSIS — D649 Anemia, unspecified: Secondary | ICD-10-CM

## 2019-12-21 DIAGNOSIS — N183 Chronic kidney disease, stage 3 unspecified: Secondary | ICD-10-CM

## 2019-12-21 DIAGNOSIS — I129 Hypertensive chronic kidney disease with stage 1 through stage 4 chronic kidney disease, or unspecified chronic kidney disease: Secondary | ICD-10-CM

## 2019-12-21 DIAGNOSIS — Z955 Presence of coronary angioplasty implant and graft: Secondary | ICD-10-CM

## 2019-12-21 DIAGNOSIS — R0603 Acute respiratory distress: Secondary | ICD-10-CM

## 2019-12-21 DIAGNOSIS — R0600 Dyspnea, unspecified: Secondary | ICD-10-CM

## 2019-12-21 DIAGNOSIS — Z87891 Personal history of nicotine dependence: Secondary | ICD-10-CM

## 2019-12-21 LAB — CBC WITH DIFFERENTIAL/PLATELET
Abs Immature Granulocytes: 0.06 10*3/uL (ref 0.00–0.07)
Basophils Absolute: 0 10*3/uL (ref 0.0–0.1)
Basophils Relative: 0 %
Eosinophils Absolute: 0 10*3/uL (ref 0.0–0.5)
Eosinophils Relative: 0 %
HCT: 26.3 % — ABNORMAL LOW (ref 36.0–46.0)
Hemoglobin: 7.8 g/dL — ABNORMAL LOW (ref 12.0–15.0)
Immature Granulocytes: 1 %
Lymphocytes Relative: 14 %
Lymphs Abs: 0.9 10*3/uL (ref 0.7–4.0)
MCH: 25.7 pg — ABNORMAL LOW (ref 26.0–34.0)
MCHC: 29.7 g/dL — ABNORMAL LOW (ref 30.0–36.0)
MCV: 86.5 fL (ref 80.0–100.0)
Monocytes Absolute: 0.3 10*3/uL (ref 0.1–1.0)
Monocytes Relative: 5 %
Neutro Abs: 5.3 10*3/uL (ref 1.7–7.7)
Neutrophils Relative %: 80 %
Platelets: 254 10*3/uL (ref 150–400)
RBC: 3.04 MIL/uL — ABNORMAL LOW (ref 3.87–5.11)
RDW: 16.5 % — ABNORMAL HIGH (ref 11.5–15.5)
WBC: 6.5 10*3/uL (ref 4.0–10.5)
nRBC: 0 % (ref 0.0–0.2)

## 2019-12-21 LAB — BASIC METABOLIC PANEL
Anion gap: 11 (ref 5–15)
BUN: 32 mg/dL — ABNORMAL HIGH (ref 8–23)
CO2: 23 mmol/L (ref 22–32)
Calcium: 8.6 mg/dL — ABNORMAL LOW (ref 8.9–10.3)
Chloride: 101 mmol/L (ref 98–111)
Creatinine, Ser: 1.51 mg/dL — ABNORMAL HIGH (ref 0.44–1.00)
GFR calc Af Amer: 41 mL/min — ABNORMAL LOW (ref >60)
GFR calc non Af Amer: 35 mL/min — ABNORMAL LOW (ref >60)
Glucose, Bld: 208 mg/dL — ABNORMAL HIGH (ref 70–99)
Potassium: 4.6 mmol/L (ref 3.5–5.1)
Sodium: 135 mmol/L (ref 135–145)

## 2019-12-21 LAB — PROTIME-INR
INR: 1.2 (ref 0.8–1.2)
Prothrombin Time: 15.1 seconds (ref 11.4–15.2)

## 2019-12-21 LAB — IRON AND TIBC
Iron: 12 ug/dL — ABNORMAL LOW (ref 28–170)
Saturation Ratios: 2 % — ABNORMAL LOW (ref 10.4–31.8)
TIBC: 533 ug/dL — ABNORMAL HIGH (ref 250–450)
UIBC: 521 ug/dL

## 2019-12-21 LAB — FERRITIN: Ferritin: 9 ng/mL — ABNORMAL LOW (ref 11–307)

## 2019-12-21 MED ORDER — PANTOPRAZOLE SODIUM 40 MG PO TBEC
40.0000 mg | DELAYED_RELEASE_TABLET | Freq: Two times a day (BID) | ORAL | Status: DC
  Administered 2019-12-21 – 2019-12-22 (×3): 40 mg via ORAL
  Filled 2019-12-21 (×4): qty 1

## 2019-12-21 MED ORDER — SODIUM CHLORIDE 0.9 % IV SOLN
510.0000 mg | Freq: Once | INTRAVENOUS | Status: AC
  Administered 2019-12-21: 510 mg via INTRAVENOUS
  Filled 2019-12-21: qty 17

## 2019-12-21 MED ORDER — SALINE SPRAY 0.65 % NA SOLN
1.0000 | NASAL | Status: DC | PRN
  Administered 2019-12-21: 1 via NASAL
  Filled 2019-12-21: qty 44

## 2019-12-21 NOTE — Progress Notes (Signed)
PROGRESS NOTE    [name]  IRC:[id number] DOB: 11-Mar-1951 DOA: 12/19/2019 PCP: Lucia Gaskins, MD   Brief Narrative:  Patient is a 69 year old female with history of COPD, diabetes type 2, hyperlipidemia, hypothyroidism, prior history of DVT not on anticoagulation, anxiety, depression who presents at [facility] hospital with complaints of chest pain, shortness of breath for 3 days.  She complained of dyspnea on exertion.  She also complained of left-sided chest pain. In the emergency department, chest x-ray showed opacity in the right midlung field.  Chest CT confirmed pneumonia.  Started on antibiotics.  She was also found to have elevated troponin.  Cardiology was consulted by the emergency physician and transferred to [facility]. Her chest pain has currently resolved.  EKG had some T wave inversion in lead I and aVL.  She had troponin elevation which could be from demand ischemia.  She has known history of coronary artery disease with multiple risk factors. BNP was elevated to 417.  She was given IV Lasix.  Echocardiogram pending. Patient was also wheezing on presentation so she has been started on bronchodilators, steroids. Lab work showed normocytic anemia with hemoglobin in the range of 7.  She reported bloody bowel movement several months ago but recently her stools are brown and she has not noticed any change in the color.  Assessment & Plan:   Active Problems:   Chest pain   Acute respiratory failure with hypoxia: Not in respiratory distress but desaturated rapidly on lying on the bed.  Will check chest x-ray today.  CT chest done on presentation did not show any pulmonary embolus but showed focal masslike lesion in the right middle lobe.  Being treated for community-acquired pneumonia.  Hypoxia most likely recommendation of community-acquired pneumonia, congestive heart failure and COPD.  Chest pain: Elevated troponin on presentation, trended down.  Currently chest pain-free.  EKG showed  T wave inversions in lead I, aVL.  Cardiology following.  Possible type II MI due to supply demand ischemia secondary to congestive heart failure.  Coronary artery disease: History of coronary artery disease with multiple risk factors.  Currently chest pain-free.  Congestive heart failure: Elevated BNP on presentation.  Given a dose of Lasix IV.  Echocardiogram pending.  Community-acquired pneumonia: CT shows focal masslike lesion in the right middle lobe.  Started on ceftriaxone and azithromycin.  Respiratory status has improved.  Normocytic anemia: Hemoglobin in the range of 7.  Her last hemoglobin in 09/2015 was 11.8.  Patient reported bloody bowel movements few months ago but currently her stool is brown.  I have requested for fecal occult blood test.  Iron studies show severe iron deficiency with low iron and ferritin.  We will give her a dose of IV iron. We have requested GI consultation  COPD exacerbation: Presented with wheezes.  Started on steroids, bronchodilators.  Currently on supplemental oxygen. Not on oxygen at home.  Diabetes type 2: Continue current regimen.  Continue to monitor CBGs.  Hemoglobins of 8.2.  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  Creatinine close to baseline.           DVT prophylaxis:Lovenox Code Status: Full Family Communication: Discussed with the patient. Disposition Plan: Patient is from home.  Clinically not ready for discharge because she is still hypoxic, she is getting work-up for possible GI bleed, cardiology has not cleared her for discharge.  Expect discharge to home when ready.  Needs PT/OT evaluation for discharge.   Consultants: Cardiology  Procedures:None  Antimicrobials:  Anti-infectives (From admission, onward)   Start     Dose/Rate Route Frequency Ordered Stop   12/20/19 2000  azithromycin (ZITHROMAX) 500 mg in sodium chloride 0.9 % 250 mL IVPB     500 mg 250 mL/hr over 60 Minutes  Intravenous Every 24 hours 12/20/19 0315     12/20/19 1800  cefTRIAXone (ROCEPHIN) 1 g in sodium chloride 0.9 % 100 mL IVPB     1 g 200 mL/hr over 30 Minutes Intravenous Every 24 hours 12/20/19 0315     12/19/19 1845  cefTRIAXone (ROCEPHIN) 1 g in sodium chloride 0.9 % 100 mL IVPB     1 g 200 mL/hr over 30 Minutes Intravenous  Once 12/19/19 1840 12/19/19 1932   12/19/19 1845  azithromycin (ZITHROMAX) 500 mg in sodium chloride 0.9 % 250 mL IVPB     500 mg 250 mL/hr over 60 Minutes Intravenous  Once 12/19/19 1840 12/19/19 2325      Subjective: Patient seen and examined at the bedside this morning.  She was sitting on the chair.  Saturating in the range of 90s on 2 L of oxygen.  She denies any shortness of breath or cough.  I was reported that, when she was monitored off oxygen, she rapidly desaturated and had to be put back on 4 L of oxygen.  Objective: Vitals:   12/20/19 2109 12/20/19 2110 12/21/19 0335 12/21/19 0341  BP:   (!) 140/56   Pulse:   72   Resp:   20   Temp:    (!) 97.3 F (36.3 C)  TempSrc:    Oral  SpO2: 96% 96% 93%   Weight:   83.8 kg   Height:        Intake/Output Summary (Last 24 hours) at 12/21/2019 0800 Last data filed at 12/21/2019 0400 Gross per 24 hour  Intake 1045 ml  Output 1350 ml  Net -305 ml   Filed Weights   12/19/19 1620 12/20/19 1103 12/21/19 0335  Weight: 83 kg 83.6 kg 83.8 kg    Examination:  General exam: Not in distress Respiratory system: Bilateral decreased air entry, few scattered wheezes Cardiovascular system: S1 & S2 heard, RRR. No JVD, murmurs, rubs, gallops or clicks. No pedal edema. Gastrointestinal system: Abdomen is nondistended, soft and nontender. No organomegaly or masses felt. Normal bowel sounds heard. Central nervous system: Alert and oriented. No focal neurological deficits. Extremities: No edema, no clubbing ,no cyanosis, amputation of right hand Skin: No rashes, lesions or ulcers,no icterus ,no pallor    Data  Reviewed: I have personally reviewed following labs and imaging studies  CBC: Recent Labs  Lab 12/19/19 1710 12/20/19 0437 12/21/19 0316  WBC 6.5 6.2 6.5  NEUTROABS 3.9  --  5.3  HGB 8.1* 7.7* 7.8*  HCT 27.8* 26.1* 26.3*  MCV 89.1 89.7 86.5  PLT 246 231 240   Basic Metabolic Panel: Recent Labs  Lab 12/19/19 1710 12/20/19 0437 12/21/19 0316  NA 133* 133* 135  K 5.2* 5.7* 4.6  CL 102 102 101  CO2 24 23 23   GLUCOSE 123* 196* 208*  BUN 19 23 32*  CREATININE 1.23* 1.34* 1.51*  CALCIUM 9.2 8.8* 8.6*   GFR: Estimated Creatinine Clearance: 38.9 mL/min (A) (by C-G formula based on SCr of 1.51 mg/dL (H)). Liver Function Tests: Recent Labs  Lab 12/19/19 1710 12/20/19 0437  AST 15 15  ALT 13 12  ALKPHOS 61 61  BILITOT 0.5 0.3  PROT 7.5 7.1  ALBUMIN  3.6 3.5   No results for input(s): LIPASE, AMYLASE in the last 168 hours. No results for input(s): AMMONIA in the last 168 hours. Coagulation Profile: No results for input(s): INR, PROTIME in the last 168 hours. Cardiac Enzymes: No results for input(s): CKTOTAL, CKMB, CKMBINDEX, TROPONINI in the last 168 hours. BNP (last 3 results) No results for input(s): PROBNP in the last 8760 hours. HbA1C: Recent Labs    12/20/19 1238  HGBA1C 8.2*   CBG: Recent Labs  Lab 12/19/19 1659 12/20/19 1201 12/20/19 1632 12/20/19 2100  GLUCAP 121* 276* 373* 224*   Lipid Profile: No results for input(s): CHOL, HDL, LDLCALC, TRIG, CHOLHDL, LDLDIRECT in the last 72 hours. Thyroid Function Tests: No results for input(s): TSH, T4TOTAL, FREET4, T3FREE, THYROIDAB in the last 72 hours. Anemia Panel: Recent Labs    12/21/19 0316  FERRITIN 9*  TIBC 533*  IRON 12*   Sepsis Labs: No results for input(s): PROCALCITON, LATICACIDVEN in the last 168 hours.  Recent Results (from the past 240 hour(s))  Respiratory Panel by RT PCR (Flu A&B, Covid) - Nasopharyngeal Swab     Status: None   Collection Time: 12/20/19 12:30 AM   Specimen:  Nasopharyngeal Swab  Result Value Ref Range Status   SARS Coronavirus 2 by RT PCR NEGATIVE NEGATIVE Final    Comment: (NOTE) SARS-CoV-2 target nucleic acids are NOT DETECTED. The SARS-CoV-2 RNA is generally detectable in upper respiratoy specimens during the acute phase of infection. The lowest concentration of SARS-CoV-2 viral copies this assay can detect is 131 copies/mL. A negative result does not preclude SARS-Cov-2 infection and should not be used as the sole basis for treatment or other patient management decisions. A negative result may occur with  improper specimen collection/handling, submission of specimen other than nasopharyngeal swab, presence of viral mutation(s) within the areas targeted by this assay, and inadequate number of viral copies (<131 copies/mL). A negative result must be combined with clinical observations, patient history, and epidemiological information. The expected result is Negative. Fact Sheet for Patients:  PinkCheek.be Fact Sheet for Healthcare Providers:  GravelBags.it This test is not yet ap proved or cleared by the [place] FDA and  has been authorized for detection and/or diagnosis of SARS-CoV-2 by FDA under an Emergency Use Authorization (EUA). This EUA will remain  in effect (meaning this test can be used) for the duration of the COVID-19 declaration under Section 564(b)(1) of the Act, 21 U.S.C. section 360bbb-3(b)(1), unless the authorization is terminated or revoked sooner.    Influenza A by PCR NEGATIVE NEGATIVE Final   Influenza B by PCR NEGATIVE NEGATIVE Final    Comment: (NOTE) The Xpert Xpress SARS-CoV-2/FLU/RSV assay is intended as an aid in  the diagnosis of influenza from Nasopharyngeal swab specimens and  should not be used as a sole basis for treatment. Nasal washings and  aspirates are unacceptable for Xpert Xpress SARS-CoV-2/FLU/RSV  testing. Fact Sheet for  Patients: PinkCheek.be Fact Sheet for Healthcare Providers: GravelBags.it This test is not yet approved or cleared by the [place] FDA and  has been authorized for detection and/or diagnosis of SARS-CoV-2 by  FDA under an Emergency Use Authorization (EUA). This EUA will remain  in effect (meaning this test can be used) for the duration of the  Covid-19 declaration under Section 564(b)(1) of the Act, 21  U.S.C. section 360bbb-3(b)(1), unless the authorization is  terminated or revoked. Performed at [facility], [address], [place], [place] [address]  Radiology Studies: CT Chest Wo Contrast  Result Date: 12/19/2019 CLINICAL DATA:  69 year old female with shortness of breath and dizziness. EXAM: CT CHEST WITHOUT CONTRAST TECHNIQUE: Multidetector CT imaging of the chest was performed following the standard protocol without IV contrast. COMPARISON:  Chest radiograph dated 12/19/2019. FINDINGS: Evaluation of this exam is limited in the absence of intravenous contrast. Cardiovascular: There is no cardiomegaly or pericardial effusion. There is 3 vessel coronary vascular calcification. There is hypoattenuation of the cardiac blood pool suggestive of a degree of anemia. Clinical correlation is recommended. Moderate atherosclerotic calcification of the thoracic aorta. The central pulmonary arteries are grossly unremarkable. Mediastinum/Nodes: Mildly enlarged bilateral hilar and mediastinal lymph nodes measure approximately 14 mm in short axis in the prevascular space and 18 mm in short axis in the subcarinal region. Evaluation of the hilar lymph nodes is limited in the absence of intravenous contrast. There is however fullness of the hilar region likely combination of vascular confluence and enlarged lymph nodes. There is an apparent right hilar adenopathy measuring 14 mm in short axis. The esophagus and the thyroid gland are grossly  unremarkable. No mediastinal fluid collection. Lungs/Pleura: There are small bilateral pleural effusions, right greater than left. There is diffuse interstitial and interlobular septal prominence consistent with mild edema. There is background of chronic interstitial coarsening. There is a 4.6 x 4.0 cm focal area of consolidation in the right middle lobe corresponding to the opacity seen on the earlier radiograph. This likely represent pneumonia. However, a mass or malignancy is not excluded. Clinical correlation and close follow-up after treatment and resolution of acute symptoms recommended. There is no pneumothorax. The central airways are patent. Upper Abdomen: Cirrhosis. Cholecystectomy. Small ascites. Musculoskeletal: Degenerative changes of the spine. No acute osseous pathology. IMPRESSION: 1. Focal consolidation in the right middle lobe most consistent with pneumonia. A mass or malignancy is not excluded. Clinical correlation and close follow-up after treatment and resolution of acute symptoms recommended. 2. Small bilateral pleural effusions and findings of interstitial edema. 3. Mildly enlarged bilateral hilar and mediastinal lymph nodes, likely reactive. 4. Cirrhosis with small ascites. 5. Aortic Atherosclerosis (ICD10-I70.0). Electronically Signed   By: Anner Crete M.D.   On: 12/19/2019 18:23   CT Angio Chest PE W/Cm &/Or Wo Cm  Result Date: 12/19/2019 CLINICAL DATA:  Shortness of breath. EXAM: CT ANGIOGRAPHY CHEST WITH CONTRAST TECHNIQUE: Multidetector CT imaging of the chest was performed using the standard protocol during bolus administration of intravenous contrast. Multiplanar CT image reconstructions and MIPs were obtained to evaluate the vascular anatomy. CONTRAST:  11m OMNIPAQUE IOHEXOL 350 MG/ML SOLN Patient pre-medicated due to reported contrast allergy. COMPARISON:  Noncontrast chest CT earlier this day. FINDINGS: Cardiovascular: There are no filling defects within the pulmonary  arteries to suggest pulmonary embolus. Aortic atherosclerosis without dissection. No pericardial effusion. Mediastinum/Nodes: Again seen bilateral hilar and mediastinal adenopathy. Representative prevascular node measures 14 mm short axis, subcarinal node measures 17 mm short axis. Enlarged right hilar node measures 17 mm short axis. Prominent left hilar nodes to a lesser extent. No esophageal wall thickening. No thyroid nodule. Lungs/Pleura: Small bilateral pleural effusions, right greater than left, unchanged from earlier this day. Interstitial and septal thickening, basilar and subpleural predominance, also unchanged. Focal consolidation in the right middle lobe measuring 4.2 x 3.8 cm abutting the fissure, unchanged. Mild central bronchial thickening. Minimal emphysema. Upper Abdomen: Nodular hepatic contours consistent with cirrhosis. Cholecystectomy. Atherosclerosis of the upper abdominal aorta. Minimal perihepatic ascites again seen. Musculoskeletal: There are no acute  or suspicious osseous abnormalities. Again seen degenerative change of the spine. Review of the MIP images confirms the above findings. IMPRESSION: 1. No pulmonary embolus. 2. Exam is otherwise unchanged from noncontrast CT earlier this day. Focal masslike consolidation in the right middle lobe which may represent pneumonia, however radiographic follow-up recommended to document resolution and exclude underlying neoplasm. 3. Small pleural effusions and findings of pulmonary edema. Enlarged mediastinal and hilar lymph nodes, likely reactive but nonspecific in the setting. 4. Cirrhosis. Minimal perihepatic ascites. Aortic Atherosclerosis (ICD10-I70.0). Electronically Signed   By: Keith Rake M.D.   On: 12/19/2019 23:01   DG Chest Port 1 View  Result Date: 12/19/2019 CLINICAL DATA:  69 year old female with shortness of breath. EXAM: PORTABLE CHEST 1 VIEW COMPARISON:  Chest radiograph dated 10/02/2015. FINDINGS: There is mild cardiomegaly  and mild vascular congestion. Background of chronic interstitial coarsening and mild bronchitic changes. Faint diffuse bilateral peripheral and subpleural hazy densities may represent chronic changes or mild edema. Atypical infiltrate is not excluded. Clinical correlation is recommended. There is a 2.8 x 5.0 cm ovoid opacity in the right mid lung field which may represent a round atelectasis, infiltrate, or loculated fluid within the fissure. A mass is not excluded. Clinical correlation and close follow-up after treatment and resolution of acute symptoms or further evaluation with CT is recommended. There is no pleural effusion or pneumothorax. Atherosclerotic calcification of the aorta. No acute osseous pathology. IMPRESSION: 1. Cardiomegaly with mild vascular congestion. 2. Ovoid opacity in the right mid lung field may represent round atelectasis, infiltrate, or loculated fluid within the fissure. Lung mass is not excluded. Clinical correlation and close follow-up after treatment and resolution of acute symptoms or further evaluation with CT is recommended. Electronically Signed   By: Anner Crete M.D.   On: 12/19/2019 17:13        Scheduled Meds: . arformoterol  15 mcg Nebulization BID  . aspirin EC  81 mg Oral Daily  . budesonide (PULMICORT) nebulizer solution  0.5 mg Nebulization BID  . cilostazol  100 mg Oral BID AC  . enoxaparin (LOVENOX) injection  40 mg Subcutaneous Q24H  . insulin aspart  0-9 Units Subcutaneous TID WC  . insulin glargine  20 Units Subcutaneous Daily  . ipratropium-albuterol  3 mL Nebulization TID  . levothyroxine  125 mcg Oral Q0600  . LORazepam  1 mg Oral BID  . methylPREDNISolone (SOLU-MEDROL) injection  40 mg Intravenous Q12H  . pantoprazole  40 mg Oral Daily  . PARoxetine  20 mg Oral Daily  . pravastatin  40 mg Oral q1800  . pregabalin  75 mg Oral Daily  . sodium chloride flush  3 mL Intravenous Q12H  . traZODone  100 mg Oral QHS   Continuous Infusions: .  sodium chloride    . azithromycin Stopped (12/21/19 0025)  . cefTRIAXone (ROCEPHIN)  IV Stopped (12/20/19 1756)  . nitroGLYCERIN       LOS: 1 day    Time spent: 25 mins.More than 50% of that time was spent in counseling and/or coordination of care.      Shelly Coss, MD [facility] P3/05/2020, 8:00 AM

## 2019-12-21 NOTE — Progress Notes (Signed)
  Echocardiogram 2D Echocardiogram has been performed.  [name] 12/21/2019, 1:54 PM

## 2019-12-21 NOTE — Progress Notes (Signed)
Pt to Radiology via bed. O2 5l / [place] tolerating well. Escorted by transporter.

## 2019-12-21 NOTE — Progress Notes (Signed)
Pt has refused all of her Blood Sugar / Accu Checks today. MD aware.

## 2019-12-21 NOTE — Progress Notes (Addendum)
Inpatient Diabetes Program Recommendations  AACE/ADA: New Consensus Statement on Inpatient Glycemic Control (2015)  Target Ranges:  Prepandial:   less than 140 mg/dL      Peak postprandial:   less than 180 mg/dL (1-2 hours)      Critically ill patients:  140 - 180 mg/dL   Lab Results  Component Value Date   GLUCAP 224 (H) 12/20/2019   HGBA1C 8.2 (H) 12/20/2019    Review of Glycemic Control Results for [name], [name] (MRN [id number]) as of 12/21/2019 09:49  Ref. Range 12/19/2019 16:59 12/20/2019 12:01 12/20/2019 16:32 12/20/2019 21:00  Glucose-Capillary Latest Ref Range: 70 - 99 mg/dL 121 (H) 276 (H) 373 (H) 224 (H)   Diabetes history: DM 2 Outpatient Diabetes medications: Lantus 40 units, Metformin 500 mg bid Current orders for Inpatient glycemic control:  Lantus 20 units Novolog 0-9 units tid   Solumedrol 40 mg Q12 hours  Inpatient Diabetes Program Recommendations:    -  Add Novolog 4 units tid meal coverage while on steroids if eating >50% of meals.  Thanks,  Tama Headings RN, MSN, BC-ADM Inpatient Diabetes Coordinator Team Pager [phone] (8a-5p)

## 2019-12-21 NOTE — Progress Notes (Signed)
Bedside report done, assumed care. Pt presents AAOx4, bright affect. Denies c/o at this time. Will con't to observe. Monitor shows NSR w/ O2 sats 97% on ra.

## 2019-12-21 NOTE — Consult Note (Signed)
[facility] Consult  Referring Provider: Shelly Coss, MD Primary Care Physician:  Lucia Gaskins, MD Primary Gastroenterologist: [facility]/Unassigned  Reason for Consultation: Symptomatic anemia  HPI: [name] is a 69 y.o. female with past medical history of COPD, type 2 diabetes, dyslipidemia, hypothyroidism, history of DVT, on aspirin 81 mg daily, last dose on Saturday, anxiety, depression, was admitted at [facility] after being transferred from [facility] for cardiology evaluation.  Patient presented with complains of shortness of breath on rest but worse on exertion, midsternal and left-sided chest pain, with chest x-ray findings of cardiomegaly, mild vascular congestion and old opacity in right midlung.  CT chest showed right middle lobe pneumonia, small bilateral pleural effusions, mildly enlarged bilateral hilar and mediastinal lymph nodes.  CT angio of chest did not show pulmonary embolus.  She was also noted to have cirrhosis with small ascites. Ultrasound from 2006 had shown hepatomegaly and diffuse fatty infiltration of the liver.  Patient was admitted with a hemoglobin of 8.1, which has stayed between 7.8 and 7.7, without obvious melena or hematochezia. Patient reports 2 episodes of small-volume hematochezia several months ago. Last colonoscopy was in 2015 and she had tubular adenomas removed.  Her grandfather had history of colon cancer in his 78s. She also had an endoscopy performed in 2015.  Patient denies unintentional weight loss, loss of appetite, early satiety, difficulty swallowing or pain on swallowing.   Past Medical History:  Diagnosis Date  . Allergic rhinitis   . 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 ASCVD (arteriosclerotic cardiovascular disease)    MI in 96 requiring BMS CX; DES to M1 in 2000;normal coronary angiography in 2004  . Cholelithiasis   . COPD (chronic obstructive pulmonary disease) ([place])   . DDD (degenerative disc  disease), lumbar   . Depression   . Diabetes mellitus   . Diabetes mellitus type I ([place])   . DVT (deep venous thrombosis) ([place])   . GERD (gastroesophageal reflux disease)   . Hyperlipidemia   . Hypothyroidism   . Low back pain   . Nephrolithiasis 2006   stone extraction   . Peripheral neuropathy   . Sciatic pain    right  . Tobacco abuse   . Tremor     Past Surgical History:  Procedure Laterality Date  . BACK SURGERY    . CHOLECYSTECTOMY    . COLONOSCOPY  01/2009   FFM:[id number] rectum/repeat in 5 yrs  . COLONOSCOPY N/A 04/29/2014   Dr.Rourk- attempted/incomplete colonoscopy. inadequate prep  . COLONOSCOPY N/A 05/27/2014   LDJ:[id number] coli. Colonic polyps-removed as described above.Status post segmental biopsy  . DILATION AND CURETTAGE OF UTERUS  1974  . ESOPHAGOGASTRODUODENOSCOPY  05/2010   Dr. Tessie Fass, erosion. 60F dilation  . ESOPHAGOGASTRODUODENOSCOPY N/A 04/29/2014   Dr.Rourk- normal esophagus s/p passage of maloney dilator. small hiatal hernia- bx= chronic inflammation.  Marland Kitchen HAND AMPUTATION Right    traumatic  . MALONEY DILATION N/A 04/29/2014   Procedure: Venia Minks DILATION;  Surgeon: Daneil Dolin, MD;  Location: [facility]ITE;  Service: Endoscopy;  Laterality: N/A;  . PARTIAL HYSTERECTOMY  1978  . SHOULDER SURGERY     Left shoulder for RTC;left arm surgery '98/left hand surgery 2001  . TOTAL ABDOMINAL HYSTERECTOMY W/ BILATERAL SALPINGOOPHORECTOMY  2002  . UMBILICAL HERNIA REPAIR  2008    Prior to Admission medications   Medication Sig Start Date End Date Taking? Authorizing Provider  albuterol (PROVENTIL HFA;VENTOLIN HFA) 108 (90 BASE) MCG/ACT inhaler Inhale 2  puffs into the lungs every 4 (four) hours as needed for wheezing or shortness of breath. 10/02/15  Yes Horton, Barbette Hair, MD  cilostazol (PLETAL) 100 MG tablet Take 1 tablet (100 mg total) by mouth 2 (two) times daily before a meal. 11/10/19  Yes Early, Arvilla Meres, MD  LANTUS SOLOSTAR 100 UNIT/ML Solostar Pen Inject  40 Units into the skin daily.  10/19/19  Yes [provider]  levothyroxine (SYNTHROID) 125 MCG tablet Take 125 mcg by mouth daily. 12/15/19  Yes [provider]  lisinopril (PRINIVIL,ZESTRIL) 20 MG tablet Take 20 mg by mouth daily.     Yes [provider]  LORazepam (ATIVAN) 1 MG tablet Take 1 mg by mouth 2 (two) times daily.  10/21/19  Yes [provider]  metFORMIN (GLUCOPHAGE) 500 MG tablet Take 500 mg by mouth 2 (two) times daily with a meal.  09/18/19  Yes [provider]  pantoprazole (PROTONIX) 40 MG tablet Take 40 mg by mouth daily. 09/29/19  Yes [provider]  PARoxetine (PAXIL) 20 MG tablet Take 20 mg by mouth daily.  09/29/19  Yes [provider]  pravastatin (PRAVACHOL) 40 MG tablet Take 40 mg by mouth daily.     Yes [provider]  pregabalin (LYRICA) 75 MG capsule Take 75 mg by mouth daily.  10/19/19  Yes [provider]  traZODone (DESYREL) 100 MG tablet Take 1 tablet by mouth at bedtime. 08/19/15  Yes [provider]  oxyCODONE-acetaminophen (PERCOCET/ROXICET) 5-325 MG tablet Take 1-2 tablets by mouth every 4 (four) hours as needed for severe pain. 09/20/15   Nat Christen, MD    Current Facility-Administered Medications  Medication Dose Route Frequency Provider Last Rate Last Admin  . 0.9 %  sodium chloride infusion  250 mL Intravenous PRN Oswald Hillock, MD      . arformoterol [facility]) nebulizer solution 15 mcg  15 mcg Nebulization BID Barton Dubois, MD   15 mcg at 12/21/19 0758  . aspirin EC tablet 81 mg  81 mg Oral Daily Barton Dubois, MD   81 mg at 12/21/19 1751  . azithromycin (ZITHROMAX) 500 mg in sodium chloride 0.9 % 250 mL IVPB  500 mg Intravenous Q24H Oswald Hillock, MD   Stopped at 12/21/19 0025  . budesonide (PULMICORT) nebulizer solution 0.5 mg  0.5 mg Nebulization BID Barton Dubois, MD   0.5 mg at 12/21/19 0758  . cefTRIAXone (ROCEPHIN) 1 g in sodium chloride 0.9 % 100 mL IVPB  1 g  Intravenous Q24H Oswald Hillock, MD   Stopped at 12/20/19 1756  . cilostazol (PLETAL) tablet 100 mg  100 mg Oral BID AC Oswald Hillock, MD   100 mg at 12/21/19 0811  . enoxaparin (LOVENOX) injection 40 mg  40 mg Subcutaneous Q24H Oswald Hillock, MD   40 mg at 12/20/19 1313  . insulin aspart (novoLOG) injection 0-9 Units  0-9 Units Subcutaneous TID WC Oswald Hillock, MD   3 Units at 12/21/19 0810  . insulin glargine (LANTUS) injection 20 Units  20 Units Subcutaneous Daily Adhikari, Amrit, MD      . ipratropium-albuterol (DUONEB) 0.5-2.5 (3) MG/3ML nebulizer solution 3 mL  3 mL Nebulization TID Shelly Coss, MD   Stopped at 12/21/19 0802  . ipratropium-albuterol (DUONEB) 0.5-2.5 (3) MG/3ML nebulizer solution 3 mL  3 mL Nebulization Q6H PRN Shelly Coss, MD   3 mL at 12/21/19 0758  . levothyroxine (SYNTHROID) tablet 125 mcg  125 mcg Oral Q0600 Dyann Kief,  Clifton James, MD   125 mcg at 12/21/19 0556  . LORazepam (ATIVAN) tablet 1 mg  1 mg Oral BID Oswald Hillock, MD   1 mg at 12/21/19 0811  . methylPREDNISolone sodium succinate (SOLU-MEDROL) 40 mg/mL injection 40 mg  40 mg Intravenous Q12H Shelly Coss, MD   40 mg at 12/21/19 0138  . nitroGLYCERIN 50 mg in dextrose 5 % 250 mL (0.2 mg/mL) infusion  2-200 mcg/min Intravenous Titrated Patwardhan, Manish J, MD      . ondansetron (ZOFRAN) tablet 4 mg  4 mg Oral Q6H PRN Oswald Hillock, MD       Or  . ondansetron (ZOFRAN) injection 4 mg  4 mg Intravenous Q6H PRN Oswald Hillock, MD      . oxyCODONE-acetaminophen (PERCOCET/ROXICET) 5-325 MG per tablet 1-2 tablet  1-2 tablet Oral Q4H PRN Oswald Hillock, MD   1 tablet at 12/20/19 1703  . pantoprazole (PROTONIX) EC tablet 40 mg  40 mg Oral Daily Oswald Hillock, MD   40 mg at 12/21/19 0811  . PARoxetine (PAXIL) tablet 20 mg  20 mg Oral Daily Oswald Hillock, MD   20 mg at 12/21/19 0811  . pravastatin (PRAVACHOL) tablet 40 mg  40 mg Oral q1800 Oswald Hillock, MD   40 mg at 12/20/19 1705  . pregabalin (LYRICA) capsule 75 mg  75  mg Oral Daily Oswald Hillock, MD   75 mg at 12/21/19 0811  . sodium chloride flush (NS) 0.9 % injection 3 mL  3 mL Intravenous Q12H Oswald Hillock, MD   3 mL at 12/21/19 0865  . sodium chloride flush (NS) 0.9 % injection 3 mL  3 mL Intravenous PRN Oswald Hillock, MD      . traZODone (DESYREL) tablet 100 mg  100 mg Oral QHS Oswald Hillock, MD   100 mg at 12/20/19 2147    Allergies as of 12/19/2019 - Review Complete 12/19/2019  Allergen Reaction Noted  . Iohexol  02/14/2005  . Tape Other (See Comments) 03/31/2014  . Ciprofloxacin Rash 02/04/2007  . Latex Rash 03/31/2014  . Penicillins Rash 02/04/2007  . Povidone-iodine Rash 02/04/2007    Family History  Problem Relation Age of Onset  . Depression Mother   . Bipolar disorder Mother   . Dementia Mother   . Pulmonary fibrosis Mother   . Alcohol abuse Father   . Aneurysm Father        deceased age 55, brain  . Colon cancer Paternal Grandfather        age greater than 69    Social History   Socioeconomic History  . Marital status: Legally Separated    Spouse name: Not on file  . Number of children: Not on file  . Years of education: Not on file  . Highest education level: Not on file  Occupational History  . 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 Smoking status: Former Smoker    Packs/day: 0.50    Years: 40.00    Pack years: 20.00    Types: Cigarettes    Quit date: 08/30/2017    Years since quitting: 2.3  . Smokeless tobacco: Never Used  . Tobacco comment: 1/2 pack per day  Substance and Sexual Activity  . Alcohol use: No  . Drug use: No  . Sexual activity: Never  Other Topics Concern  . Not on file  Social History  Narrative   Married x3   Lives with husband   GED in 2005   Social Determinants of Health   Financial Resource Strain:   . Difficulty of Paying Living Expenses: Not on file  Food Insecurity:   . Worried About Charity fundraiser  in the Last Year: Not on file  . Ran Out of Food in the Last Year: Not on file  Transportation Needs:   . Lack of Transportation (Medical): Not on file  . Lack of Transportation (Non-Medical): Not on file  Physical Activity:   . Days of Exercise per Week: Not on file  . Minutes of Exercise per Session: Not on file  Stress:   . Feeling of Stress : Not on file  Social Connections:   . Frequency of Communication with Friends and Family: Not on file  . Frequency of Social Gatherings with Friends and Family: Not on file  . Attends Religious Services: Not on file  . Active Member of Clubs or Organizations: Not on file  . Attends Archivist Meetings: Not on file  . Marital Status: Not on file  Intimate Partner Violence:   . Fear of Current or Ex-Partner: Not on file  . Emotionally Abused: Not on file  . Physically Abused: Not on file  . Sexually Abused: Not on file    Review of Systems: Positive for: GI: Described in detail in HPI.    Gen: Denies any fever, chills, rigors, night sweats, anorexia, fatigue, weakness, malaise, involuntary weight loss, and sleep disorder CV: Denies chest pain, angina, palpitations, syncope, orthopnea, PND, peripheral edema, and claudication. Resp: dyspnea, cough, sputum, wheezing, Denies coughing up blood. GU : Denies urinary burning, blood in urine, urinary frequency, urinary hesitancy, nocturnal urination, and urinary incontinence. MS: Denies joint pain or swelling.  Denies muscle weakness, cramps, atrophy.  Derm: Denies rash, itching, oral ulcerations, hives, unhealing ulcers.  Psych: Denies depression, anxiety, memory loss, suicidal ideation, hallucinations,  and confusion. Heme: Denies bruising, bleeding, and enlarged lymph nodes. Neuro:  Denies any headaches, dizziness, paresthesias. Endo:  DM, hypothyroid,Denies any problems with  adrenal function.  Physical Exam: Vital signs in last 24 hours: Temp:  [97.3 F (36.3 C)-98.3 F (36.8 C)]  97.3 F (36.3 C) (03/08 0341) Pulse Rate:  [64-80] 72 (03/08 0335) Resp:  [20] 20 (03/08 0335) BP: (125-155)/(56-81) 140/56 (03/08 0335) SpO2:  [93 %-96 %] 93 % (03/08 0335) Weight:  [83.6 kg-83.8 kg] 83.8 kg (03/08 0335) Last BM Date: 12/21/19  General:   Alert,  Well-developed, overweight, pleasant and cooperative in NAD Head:  Normocephalic and atraumatic. Eyes:  Sclera clear, no icterus.   Mild pallor Ears:  Normal auditory acuity. Nose:  No deformity, discharge,  or lesions. Mouth:  No deformity or lesions.  Oropharynx pink & moist. Neck:  Supple; no masses or thyromegaly. Lungs: Minimal wheezing, decreased breath sounds over right lung field, on 4 L oxygen via nasal cannula, able to speak in few words not full sentences Heart:  Regular rate and rhythm; no murmurs, clicks, rubs,  or gallops. Extremities:  Without clubbing or edema. Neurologic:  Alert and  oriented x4;  grossly normal neurologically. Skin:  Intact without significant lesions or rashes. Psych:  Alert and cooperative. Normal mood and affect. Abdomen:  Soft, nontender and nondistended. No masses, hepatosplenomegaly or hernias noted. Normal bowel sounds, without guarding, and without rebound.         Lab Results: Recent Labs    12/19/19 1710 12/20/19 0437 12/21/19  0316  WBC 6.5 6.2 6.5  HGB 8.1* 7.7* 7.8*  HCT 27.8* 26.1* 26.3*  PLT 246 231 254   BMET Recent Labs    12/19/19 1710 12/20/19 0437 12/21/19 0316  NA 133* 133* 135  K 5.2* 5.7* 4.6  CL 102 102 101  CO2 24 23 23   GLUCOSE 123* 196* 208*  BUN 19 23 32*  CREATININE 1.23* 1.34* 1.51*  CALCIUM 9.2 8.8* 8.6*   LFT Recent Labs    12/20/19 0437  PROT 7.1  ALBUMIN 3.5  AST 15  ALT 12  ALKPHOS 61  BILITOT 0.3   PT/INR No results for input(s): LABPROT, INR in the last 72 hours.  Studies/Results: CT Chest Wo Contrast  Result Date: 12/19/2019 CLINICAL DATA:  69 year old female with shortness of breath and dizziness. EXAM: CT CHEST  WITHOUT CONTRAST TECHNIQUE: Multidetector CT imaging of the chest was performed following the standard protocol without IV contrast. COMPARISON:  Chest radiograph dated 12/19/2019. FINDINGS: Evaluation of this exam is limited in the absence of intravenous contrast. Cardiovascular: There is no cardiomegaly or pericardial effusion. There is 3 vessel coronary vascular calcification. There is hypoattenuation of the cardiac blood pool suggestive of a degree of anemia. Clinical correlation is recommended. Moderate atherosclerotic calcification of the thoracic aorta. The central pulmonary arteries are grossly unremarkable. Mediastinum/Nodes: Mildly enlarged bilateral hilar and mediastinal lymph nodes measure approximately 14 mm in short axis in the prevascular space and 18 mm in short axis in the subcarinal region. Evaluation of the hilar lymph nodes is limited in the absence of intravenous contrast. There is however fullness of the hilar region likely combination of vascular confluence and enlarged lymph nodes. There is an apparent right hilar adenopathy measuring 14 mm in short axis. The esophagus and the thyroid gland are grossly unremarkable. No mediastinal fluid collection. Lungs/Pleura: There are small bilateral pleural effusions, right greater than left. There is diffuse interstitial and interlobular septal prominence consistent with mild edema. There is background of chronic interstitial coarsening. There is a 4.6 x 4.0 cm focal area of consolidation in the right middle lobe corresponding to the opacity seen on the earlier radiograph. This likely represent pneumonia. However, a mass or malignancy is not excluded. Clinical correlation and close follow-up after treatment and resolution of acute symptoms recommended. There is no pneumothorax. The central airways are patent. Upper Abdomen: Cirrhosis. Cholecystectomy. Small ascites. Musculoskeletal: Degenerative changes of the spine. No acute osseous pathology.  IMPRESSION: 1. Focal consolidation in the right middle lobe most consistent with pneumonia. A mass or malignancy is not excluded. Clinical correlation and close follow-up after treatment and resolution of acute symptoms recommended. 2. Small bilateral pleural effusions and findings of interstitial edema. 3. Mildly enlarged bilateral hilar and mediastinal lymph nodes, likely reactive. 4. Cirrhosis with small ascites. 5. Aortic Atherosclerosis (ICD10-I70.0). Electronically Signed   By: Anner Crete M.D.   On: 12/19/2019 18:23   CT Angio Chest PE W/Cm &/Or Wo Cm  Result Date: 12/19/2019 CLINICAL DATA:  Shortness of breath. EXAM: CT ANGIOGRAPHY CHEST WITH CONTRAST TECHNIQUE: Multidetector CT imaging of the chest was performed using the standard protocol during bolus administration of intravenous contrast. Multiplanar CT image reconstructions and MIPs were obtained to evaluate the vascular anatomy. CONTRAST:  11m OMNIPAQUE IOHEXOL 350 MG/ML SOLN Patient pre-medicated due to reported contrast allergy. COMPARISON:  Noncontrast chest CT earlier this day. FINDINGS: Cardiovascular: There are no filling defects within the pulmonary arteries to suggest pulmonary embolus. Aortic atherosclerosis without dissection. No pericardial effusion. Mediastinum/Nodes:  Again seen bilateral hilar and mediastinal adenopathy. Representative prevascular node measures 14 mm short axis, subcarinal node measures 17 mm short axis. Enlarged right hilar node measures 17 mm short axis. Prominent left hilar nodes to a lesser extent. No esophageal wall thickening. No thyroid nodule. Lungs/Pleura: Small bilateral pleural effusions, right greater than left, unchanged from earlier this day. Interstitial and septal thickening, basilar and subpleural predominance, also unchanged. Focal consolidation in the right middle lobe measuring 4.2 x 3.8 cm abutting the fissure, unchanged. Mild central bronchial thickening. Minimal emphysema. Upper Abdomen:  Nodular hepatic contours consistent with cirrhosis. Cholecystectomy. Atherosclerosis of the upper abdominal aorta. Minimal perihepatic ascites again seen. Musculoskeletal: There are no acute or suspicious osseous abnormalities. Again seen degenerative change of the spine. Review of the MIP images confirms the above findings. IMPRESSION: 1. No pulmonary embolus. 2. Exam is otherwise unchanged from noncontrast CT earlier this day. Focal masslike consolidation in the right middle lobe which may represent pneumonia, however radiographic follow-up recommended to document resolution and exclude underlying neoplasm. 3. Small pleural effusions and findings of pulmonary edema. Enlarged mediastinal and hilar lymph nodes, likely reactive but nonspecific in the setting. 4. Cirrhosis. Minimal perihepatic ascites. Aortic Atherosclerosis (ICD10-I70.0). Electronically Signed   By: Keith Rake M.D.   On: 12/19/2019 23:01   DG Chest Port 1 View  Result Date: 12/19/2019 CLINICAL DATA:  69 year old female with shortness of breath. EXAM: PORTABLE CHEST 1 VIEW COMPARISON:  Chest radiograph dated 10/02/2015. FINDINGS: There is mild cardiomegaly and mild vascular congestion. Background of chronic interstitial coarsening and mild bronchitic changes. Faint diffuse bilateral peripheral and subpleural hazy densities may represent chronic changes or mild edema. Atypical infiltrate is not excluded. Clinical correlation is recommended. There is a 2.8 x 5.0 cm ovoid opacity in the right mid lung field which may represent a round atelectasis, infiltrate, or loculated fluid within the fissure. A mass is not excluded. Clinical correlation and close follow-up after treatment and resolution of acute symptoms or further evaluation with CT is recommended. There is no pleural effusion or pneumothorax. Atherosclerotic calcification of the aorta. No acute osseous pathology. IMPRESSION: 1. Cardiomegaly with mild vascular congestion. 2. Ovoid opacity  in the right mid lung field may represent round atelectasis, infiltrate, or loculated fluid within the fissure. Lung mass is not excluded. Clinical correlation and close follow-up after treatment and resolution of acute symptoms or further evaluation with CT is recommended. Electronically Signed   By: Anner Crete M.D.   On: 12/19/2019 17:13    Impression: Normocytic anemia without obvious melena or hematochezia elevated BUN/creatinine of 32/1.51 cirrhosis and small amount of ascites noted on CAT scan severe iron deficiency, iron saturation 2%, elevated TIBC of 533, ferritin of only 9  Right middle lobe opacity, being treated as pneumonia  Chest pain, exertional dyspnea, elevated high-sensitivity troponins, cardiology on board and recommending GI evaluation prior to starting patient on therapeutic heparin while undergoing LHC with possible intervention  Plan: Currently patient is hypoxic, short of breath, and is on oxygen 4 L via nasal cannula. She will not be a candidate for anesthesia for EGD/colonoscopy at the moment . Will reevaluate in 24 hours, plan to start patient on clear liquid diet tomorrow, if respiratory status has improved, will plan on giving colonic prep tomorrow with possible EGD and colonoscopy on Wednesday.  Patient was noted to have cirrhosis with ascites on CAT scan of the chest, will order an ultrasound of the abdomen to confirm diagnosis of cirrhosis and if patient has  ascites will require paracentesis for diagnostic tap.  Patient may have anemia from underlying portal hypertension, will need an EGD for evaluation of varices, will also need a colonoscopy for history of tubular adenoma in 2015 and severe iron deficiency anemia.  I will get our PT/INR to calculate a MELDNa for risk stratification.   LOS: 1 day   Ronnette Juniper, MD  12/21/2019, 10:50 AM

## 2019-12-21 NOTE — Progress Notes (Signed)
Subjective:  Patient seen and examined at bedside approximately 8:30 AM. Patient denies any chest pain at rest.  Patient was unable to comment on any chest discomfort with effort related activities that she is mostly bedrest. Nonproductive cough, improved.  Patient states that she does have audible wheezing but usually improves with inhaler therapy. No events overnight. Telemetry reviewed  Objective:  Vital Signs in the last 24 hours: Temp:  [97.3 F (36.3 C)-98.3 F (36.8 C)] 97.3 F (36.3 C) (03/08 0341) Pulse Rate:  [64-80] 72 (03/08 0335) Resp:  [16-22] 20 (03/08 0335) BP: (125-155)/(56-81) 140/56 (03/08 0335) SpO2:  [92 %-96 %] 93 % (03/08 0335) Weight:  [83.6 kg-83.8 kg] 83.8 kg (03/08 0335)  Intake/Output from previous day: 03/07 0701 - 03/08 0700 In: 1045 [P.O.:670; IV Piggyback:375] Out: 3016 [Urine:1350]  Physical Exam  Constitutional: She is oriented to person, place, and time. She appears well-developed.  HENT:  Head: Normocephalic and atraumatic.  Cardiovascular: Normal rate, regular rhythm and normal heart sounds. Exam reveals no gallop and no friction rub.  No murmur heard. Pulmonary/Chest: No respiratory distress. She has wheezes. She has no rales. She exhibits no tenderness.  Abdominal: She exhibits no distension. There is no abdominal tenderness. There is no rebound and no guarding.  Musculoskeletal:        General: Normal range of motion.     Cervical back: Normal range of motion.     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  Neurological: She is alert and oriented to person, place, and time.  Skin: Skin is warm and dry.   Lab Results: BMP Recent Labs    12/19/19 1710 12/20/19 0437 12/21/19 0316  NA 133* 133* 135  K 5.2* 5.7* 4.6  CL 102 102 101  CO2 24 23 23   GLUCOSE 123* 196* 208*  BUN 19 23 32*  CREATININE 1.23* 1.34* 1.51*  CALCIUM 9.2 8.8* 8.6*  GFRNONAA 45* 41* 35*  GFRAA 52* 47* 41*    CBC Recent Labs  Lab 12/21/19 0316   WBC 6.5  RBC 3.04*  HGB 7.8*  HCT 26.3*  PLT 254  MCV 86.5  MCH 25.7*  MCHC 29.7*  RDW 16.5*  LYMPHSABS 0.9  MONOABS 0.3  EOSABS 0.0  BASOSABS 0.0    HEMOGLOBIN A1C Lab Results  Component Value Date   HGBA1C 8.2 (H) 12/20/2019   MPG 188.64 12/20/2019    Cardiac Panel (last 3 results) No results for input(s): CKTOTAL, CKMB, TROPONINI, RELINDX in the last 8760 hours.  BNP (last 3 results) Recent Labs    12/19/19 1710  BNP 417.0*    TSH No results for input(s): TSH in the last 8760 hours.  Lipid Panel     Component Value Date/Time   CHOL 142 05/30/2009 2042   TRIG 189 (H) 05/30/2009 2042   HDL 36 (L) 05/30/2009 2042   CHOLHDL 3.9 Ratio 05/30/2009 2042   VLDL 38 05/30/2009 2042   LDLCALC 68 05/30/2009 2042     Hepatic Function Panel Recent Labs    12/19/19 1710 12/20/19 0437  PROT 7.5 7.1  ALBUMIN 3.6 3.5  AST 15 15  ALT 13 12  ALKPHOS 61 61  BILITOT 0.5 0.3   Telemetry: Normal sinus rhythm with rare PVCs.  Imaging: CT Chest Wo Contrast  Result Date: 12/19/2019 CLINICAL DATA:  69 year old female with shortness of breath and dizziness. EXAM: CT CHEST WITHOUT CONTRAST TECHNIQUE: Multidetector CT imaging of the chest was performed following the standard protocol without IV contrast.  COMPARISON:  Chest radiograph dated 12/19/2019. FINDINGS: Evaluation of this exam is limited in the absence of intravenous contrast. Cardiovascular: There is no cardiomegaly or pericardial effusion. There is 3 vessel coronary vascular calcification. There is hypoattenuation of the cardiac blood pool suggestive of a degree of anemia. Clinical correlation is recommended. Moderate atherosclerotic calcification of the thoracic aorta. The central pulmonary arteries are grossly unremarkable. Mediastinum/Nodes: Mildly enlarged bilateral hilar and mediastinal lymph nodes measure approximately 14 mm in short axis in the prevascular space and 18 mm in short axis in the subcarinal region.  Evaluation of the hilar lymph nodes is limited in the absence of intravenous contrast. There is however fullness of the hilar region likely combination of vascular confluence and enlarged lymph nodes. There is an apparent right hilar adenopathy measuring 14 mm in short axis. The esophagus and the thyroid gland are grossly unremarkable. No mediastinal fluid collection. Lungs/Pleura: There are small bilateral pleural effusions, right greater than left. There is diffuse interstitial and interlobular septal prominence consistent with mild edema. There is background of chronic interstitial coarsening. There is a 4.6 x 4.0 cm focal area of consolidation in the right middle lobe corresponding to the opacity seen on the earlier radiograph. This likely represent pneumonia. However, a mass or malignancy is not excluded. Clinical correlation and close follow-up after treatment and resolution of acute symptoms recommended. There is no pneumothorax. The central airways are patent. Upper Abdomen: Cirrhosis. Cholecystectomy. Small ascites. Musculoskeletal: Degenerative changes of the spine. No acute osseous pathology. IMPRESSION: 1. Focal consolidation in the right middle lobe most consistent with pneumonia. A mass or malignancy is not excluded. Clinical correlation and close follow-up after treatment and resolution of acute symptoms recommended. 2. Small bilateral pleural effusions and findings of interstitial edema. 3. Mildly enlarged bilateral hilar and mediastinal lymph nodes, likely reactive. 4. Cirrhosis with small ascites. 5. Aortic Atherosclerosis (ICD10-I70.0). Electronically Signed   By: Anner Crete M.D.   On: 12/19/2019 18:23   CT Angio Chest PE W/Cm &/Or Wo Cm  Result Date: 12/19/2019 CLINICAL DATA:  Shortness of breath. EXAM: CT ANGIOGRAPHY CHEST WITH CONTRAST TECHNIQUE: Multidetector CT imaging of the chest was performed using the standard protocol during bolus administration of intravenous contrast.  Multiplanar CT image reconstructions and MIPs were obtained to evaluate the vascular anatomy. CONTRAST:  65m OMNIPAQUE IOHEXOL 350 MG/ML SOLN Patient pre-medicated due to reported contrast allergy. COMPARISON:  Noncontrast chest CT earlier this day. FINDINGS: Cardiovascular: There are no filling defects within the pulmonary arteries to suggest pulmonary embolus. Aortic atherosclerosis without dissection. No pericardial effusion. Mediastinum/Nodes: Again seen bilateral hilar and mediastinal adenopathy. Representative prevascular node measures 14 mm short axis, subcarinal node measures 17 mm short axis. Enlarged right hilar node measures 17 mm short axis. Prominent left hilar nodes to a lesser extent. No esophageal wall thickening. No thyroid nodule. Lungs/Pleura: Small bilateral pleural effusions, right greater than left, unchanged from earlier this day. Interstitial and septal thickening, basilar and subpleural predominance, also unchanged. Focal consolidation in the right middle lobe measuring 4.2 x 3.8 cm abutting the fissure, unchanged. Mild central bronchial thickening. Minimal emphysema. Upper Abdomen: Nodular hepatic contours consistent with cirrhosis. Cholecystectomy. Atherosclerosis of the upper abdominal aorta. Minimal perihepatic ascites again seen. Musculoskeletal: There are no acute or suspicious osseous abnormalities. Again seen degenerative change of the spine. Review of the MIP images confirms the above findings. IMPRESSION: 1. No pulmonary embolus. 2. Exam is otherwise unchanged from noncontrast CT earlier this day. Focal masslike consolidation in the right  middle lobe which may represent pneumonia, however radiographic follow-up recommended to document resolution and exclude underlying neoplasm. 3. Small pleural effusions and findings of pulmonary edema. Enlarged mediastinal and hilar lymph nodes, likely reactive but nonspecific in the setting. 4. Cirrhosis. Minimal perihepatic ascites. Aortic  Atherosclerosis (ICD10-I70.0). Electronically Signed   By: Keith Rake M.D.   On: 12/19/2019 23:01   DG Chest Port 1 View  Result Date: 12/19/2019 CLINICAL DATA:  69 year old female with shortness of breath. EXAM: PORTABLE CHEST 1 VIEW COMPARISON:  Chest radiograph dated 10/02/2015. FINDINGS: There is mild cardiomegaly and mild vascular congestion. Background of chronic interstitial coarsening and mild bronchitic changes. Faint diffuse bilateral peripheral and subpleural hazy densities may represent chronic changes or mild edema. Atypical infiltrate is not excluded. Clinical correlation is recommended. There is a 2.8 x 5.0 cm ovoid opacity in the right mid lung field which may represent a round atelectasis, infiltrate, or loculated fluid within the fissure. A mass is not excluded. Clinical correlation and close follow-up after treatment and resolution of acute symptoms or further evaluation with CT is recommended. There is no pleural effusion or pneumothorax. Atherosclerotic calcification of the aorta. No acute osseous pathology. IMPRESSION: 1. Cardiomegaly with mild vascular congestion. 2. Ovoid opacity in the right mid lung field may represent round atelectasis, infiltrate, or loculated fluid within the fissure. Lung mass is not excluded. Clinical correlation and close follow-up after treatment and resolution of acute symptoms or further evaluation with CT is recommended. Electronically Signed   By: Anner Crete M.D.   On: 12/19/2019 17:13    Cardiac Studies:  EKG 12/20/2019: Normal sinus rhythm ventricular rate of 70 bpm, possible old anteroseptal infarct, T wave inversions in the high lateral leads suggestive of possible ischemia.  Echocardiogram: Pending  Assessment & Recommendations: 69 y.o. Caucasian female  with history of prior myocardial infarction in 1996, coronary artery disease (cath 2010) BMS to LCx, controlled hypertension, type 2 diabetes mellitus, hyperlipidemia, former smoker,  hypothyroidism, COPD/asthma, depression, admitted with chest pain, shortness of breath.  Chest pain effort related dyspnea:  Currently patient does not have any active chest pain at rest.  Unable to quantify chest discomfort with ambulation as she is currently at bedrest.  High-sensitivity troponins were elevated, noted the rise and fall.  There is a suspicion for type I MI but in the setting of established coronary disease, anemia and pneumonia this could be type II MI as well secondary to supply demand mismatch.  Echocardiogram pending.  Awaiting GI recommendations to see if she is a candidate for therapeutic heparin when undergoing LHC w/ possible intervention.  This was conveyed to the patient who is in agreement with the recommendations provided.  Right middle lobe opacity with an expiratory wheezes: Currently managed by primary team.   Established coronary disease with prior intervention: See above.   Acute kidney injury most likely secondary to intravascular depletion due to underlying anemia and diuresis. Recommend holding Lasix.  Anemia: H&H relatively stable.  Currently managed by primary team.  Hypertension with chronic kidney disease stage III: Currently on medications and monitor.   Insulin-dependent diabetes mellitus type 2: Currently managed by primary team.  Mixed hyperlipidemia: Continue statin therapy.  Former smoker: Educated on importance of continued smoking cessation.  Rex Kras, DO, [facility]. [place] Office: [phone]

## 2019-12-22 ENCOUNTER — Encounter (HOSPITAL_COMMUNITY): Payer: Self-pay | Admitting: Family Medicine

## 2019-12-22 DIAGNOSIS — J189 Pneumonia, unspecified organism: Secondary | ICD-10-CM

## 2019-12-22 HISTORY — DX: Pneumonia, unspecified organism: J18.9

## 2019-12-22 LAB — CBC WITH DIFFERENTIAL/PLATELET
Abs Immature Granulocytes: 0.09 10*3/uL — ABNORMAL HIGH (ref 0.00–0.07)
Basophils Absolute: 0 10*3/uL (ref 0.0–0.1)
Basophils Relative: 0 %
Eosinophils Absolute: 0 10*3/uL (ref 0.0–0.5)
Eosinophils Relative: 0 %
HCT: 24.3 % — ABNORMAL LOW (ref 36.0–46.0)
Hemoglobin: 7.4 g/dL — ABNORMAL LOW (ref 12.0–15.0)
Immature Granulocytes: 1 %
Lymphocytes Relative: 11 %
Lymphs Abs: 0.8 10*3/uL (ref 0.7–4.0)
MCH: 26.4 pg (ref 26.0–34.0)
MCHC: 30.5 g/dL (ref 30.0–36.0)
MCV: 86.8 fL (ref 80.0–100.0)
Monocytes Absolute: 0.4 10*3/uL (ref 0.1–1.0)
Monocytes Relative: 5 %
Neutro Abs: 5.4 10*3/uL (ref 1.7–7.7)
Neutrophils Relative %: 83 %
Platelets: 254 10*3/uL (ref 150–400)
RBC: 2.8 MIL/uL — ABNORMAL LOW (ref 3.87–5.11)
RDW: 16.4 % — ABNORMAL HIGH (ref 11.5–15.5)
WBC: 6.7 10*3/uL (ref 4.0–10.5)
nRBC: 0 % (ref 0.0–0.2)

## 2019-12-22 LAB — ECHOCARDIOGRAM COMPLETE
Height: 66 in
Weight: 2956.8 oz

## 2019-12-22 LAB — BASIC METABOLIC PANEL
Anion gap: 11 (ref 5–15)
BUN: 36 mg/dL — ABNORMAL HIGH (ref 8–23)
CO2: 21 mmol/L — ABNORMAL LOW (ref 22–32)
Calcium: 8.8 mg/dL — ABNORMAL LOW (ref 8.9–10.3)
Chloride: 105 mmol/L (ref 98–111)
Creatinine, Ser: 1.49 mg/dL — ABNORMAL HIGH (ref 0.44–1.00)
GFR calc Af Amer: 41 mL/min — ABNORMAL LOW (ref >60)
GFR calc non Af Amer: 36 mL/min — ABNORMAL LOW (ref >60)
Glucose, Bld: 274 mg/dL — ABNORMAL HIGH (ref 70–99)
Potassium: 5.1 mmol/L (ref 3.5–5.1)
Sodium: 137 mmol/L (ref 135–145)

## 2019-12-22 LAB — GLUCOSE, CAPILLARY
Glucose-Capillary: 297 mg/dL — ABNORMAL HIGH (ref 70–99)
Glucose-Capillary: 255 mg/dL — ABNORMAL HIGH (ref 70–99)
Glucose-Capillary: 131 mg/dL — ABNORMAL HIGH (ref 70–99)

## 2019-12-22 LAB — MAGNESIUM: Magnesium: 2.1 mg/dL (ref 1.7–2.4)

## 2019-12-22 MED ORDER — PEG 3350-KCL-NA BICARB-NACL 420 G PO SOLR: 4000.0000 mL | Freq: Once | ORAL | Status: DC

## 2019-12-22 MED ORDER — AZITHROMYCIN 250 MG PO TABS
250.0000 mg | ORAL_TABLET | Freq: Every day | ORAL | Status: AC
  Administered 2019-12-22 – 2019-12-24 (×3): 250 mg via ORAL
  Filled 2019-12-22 (×3): qty 1

## 2019-12-22 MED ORDER — PEG 3350-KCL-NA BICARB-NACL 420 G PO SOLR
4000.0000 mL | Freq: Once | ORAL | Status: AC
  Administered 2019-12-22: 4000 mL via ORAL
  Filled 2019-12-22: qty 4000

## 2019-12-22 MED ORDER — PREDNISONE 20 MG PO TABS
40.0000 mg | ORAL_TABLET | Freq: Every day | ORAL | Status: DC
  Administered 2019-12-23 – 2019-12-24 (×2): 40 mg via ORAL
  Filled 2019-12-22 (×2): qty 2

## 2019-12-22 MED ORDER — IPRATROPIUM-ALBUTEROL 0.5-2.5 (3) MG/3ML IN SOLN
3.0000 mL | Freq: Two times a day (BID) | RESPIRATORY_TRACT | Status: DC
  Administered 2019-12-22 – 2019-12-23 (×3): 3 mL via RESPIRATORY_TRACT
  Filled 2019-12-22 (×4): qty 3

## 2019-12-22 NOTE — Progress Notes (Signed)
Patient did not want any GI procedures, and stated she would have them outpatient. After speaking with physicians rounding this morning she verbalized good understanding of need for procedures in evaluating source of bleeding prior to cardiac catheterization. Started prep this morning and has had great results. Complained of number of stools and has been encouraged to continue prep, explaining importance of clear results and complete colon cleanse for proper evaluation. Patient has continued to drink prep after encouragement and explanation.

## 2019-12-22 NOTE — Progress Notes (Signed)
Subjective:  Patient seen and examined at bedside approximately 750AM. Patient denies any chest pain at rest. Patient denies any chest discomfort while ambulating on the floor yesterday night. Nonproductive cough, improved.  And expiratory wheezing has resolved. No events overnight. Telemetry reviewed  Objective:  Vital Signs in the last 24 hours: Temp:  [98.1 F (36.7 C)-98.4 F (36.9 C)] 98.1 F (36.7 C) (03/09 0517) Pulse Rate:  [81-90] 87 (03/09 0517) Resp:  [20] 20 (03/08 1110) BP: (116-148)/(47-86) 148/86 (03/09 0517) SpO2:  [94 %-100 %] 96 % (03/09 0517) Weight:  [83.8 kg] 83.8 kg (03/09 0517)  Intake/Output from previous day: 03/08 0701 - 03/09 0700 In: 1690.2 [P.O.:1422; I.V.:3; IV Piggyback:265.2] Out: -   Physical Exam  Constitutional: She is oriented to person, place, and time. She appears well-developed.  HENT:  Head: Normocephalic and atraumatic.  Cardiovascular: Normal rate, regular rhythm and normal heart sounds. Exam reveals no gallop and no friction rub.  No murmur heard. Pulmonary/Chest: No respiratory distress. She has no wheezes. She has no rales. She exhibits no tenderness.  Abdominal: She exhibits no distension. There is no abdominal tenderness. There is no rebound and no guarding.  Musculoskeletal:        General: Normal range of motion.     Cervical back: Normal range of motion.     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  Neurological: She is alert and oriented to person, place, and time.  Skin: Skin is warm and dry.   Lab Results: BMP Recent Labs    12/20/19 0437 12/21/19 0316 12/22/19 0313  NA 133* 135 137  K 5.7* 4.6 5.1  CL 102 101 105  CO2 23 23 21*  GLUCOSE 196* 208* 274*  BUN 23 32* 36*  CREATININE 1.34* 1.51* 1.49*  CALCIUM 8.8* 8.6* 8.8*  GFRNONAA 41* 35* 36*  GFRAA 47* 41* 41*    CBC Recent Labs  Lab 12/22/19 0345  WBC 6.7  RBC 2.80*  HGB 7.4*  HCT 24.3*  PLT 254  MCV 86.8  MCH 26.4  MCHC 30.5  RDW  16.4*  LYMPHSABS 0.8  MONOABS 0.4  EOSABS 0.0  BASOSABS 0.0    HEMOGLOBIN A1C Lab Results  Component Value Date   HGBA1C 8.2 (H) 12/20/2019   MPG 188.64 12/20/2019    Cardiac Panel (last 3 results) No results for input(s): CKTOTAL, CKMB, TROPONINI, RELINDX in the last 8760 hours.  BNP (last 3 results) Recent Labs    12/19/19 1710  BNP 417.0*    TSH No results for input(s): TSH in the last 8760 hours.  Lipid Panel     Component Value Date/Time   CHOL 142 05/30/2009 2042   TRIG 189 (H) 05/30/2009 2042   HDL 36 (L) 05/30/2009 2042   CHOLHDL 3.9 Ratio 05/30/2009 2042   VLDL 38 05/30/2009 2042   LDLCALC 68 05/30/2009 2042     Hepatic Function Panel Recent Labs    12/19/19 1710 12/20/19 0437  PROT 7.5 7.1  ALBUMIN 3.6 3.5  AST 15 15  ALT 13 12  ALKPHOS 61 61  BILITOT 0.5 0.3   Telemetry: Normal sinus rhythm with rare PVCs.  Imaging: DG Chest Port 1 View  Result Date: 12/21/2019 CLINICAL DATA:  Shortness of breath. EXAM: PORTABLE CHEST 1 VIEW COMPARISON:  December 19, 2019. FINDINGS: Stable cardiomediastinal silhouette. Atherosclerosis of thoracic aorta is noted. No pneumothorax or pleural effusion is noted. Mild bibasilar subsegmental atelectasis is noted. Stable right middle lobe density is noted concerning  for pneumonia, atelectasis or mass. Bony thorax is unremarkable. IMPRESSION: Stable right middle lobe density is noted concerning for pneumonia, atelectasis or mass. Continued radiographic follow-up is recommended. Mild bibasilar subsegmental atelectasis is noted. Aortic Atherosclerosis (ICD10-I70.0). Electronically Signed   By: Marijo Conception M.D.   On: 12/21/2019 13:46   US Abdomen Limited RUQ  Result Date: 12/21/2019 CLINICAL DATA:  Cirrhosis, search for ascites EXAM: ULTRASOUND ABDOMEN LIMITED RIGHT UPPER QUADRANT COMPARISON:  CT chest 02/18/2020 FINDINGS: Gallbladder: Surgically absent Common bile duct: Diameter: 0.5 cm Liver: No focal lesion identified.  Nodular contour with diffuse coarse echotexture. Portal vein is patent on color Doppler imaging with normal direction of blood flow towards the liver. There is trace perihepatic ascites. Other: Small right pleural effusion. IMPRESSION: 1.  Trace perihepatic ascites. 2.  Cirrhotic liver. 3.  Small right pleural effusion. Electronically Signed   By: Audie Pinto M.D.   On: 12/21/2019 12:56    Cardiac Studies:  EKG 12/20/2019: Normal sinus rhythm ventricular rate of 70 bpm, possible old anteroseptal infarct, T wave inversions in the high lateral leads suggestive of possible ischemia.  Echocardiogram: Pending  Assessment & Recommendations: 69 y.o. Caucasian female  with history of prior myocardial infarction in 1996, coronary artery disease (cath 2010) BMS to LCx, controlled hypertension, type 2 diabetes mellitus, hyperlipidemia, former smoker, hypothyroidism, COPD/asthma, depression, admitted with chest pain, shortness of breath.  Chest pain & effort related dyspnea:  Currently patient does not have any active chest pain at rest or with ambulation.    Explained to the patient that her rise and fall of high-sensitivity troponins could be secondary to either type I or type II myocardial infarction.  This is in the setting of symptomatic anemia.   Patient is currently being evaluated by GI service.  Recommendations appreciated.  Echocardiogram pending.  Awaiting GI recommendations to see if she is a candidate for therapeutic heparin when undergoing LHC w/ possible intervention.   This was conveyed to the patient who is in agreement with the recommendations provided.  Asymptomatic ventricular runs noted on telemetry: Potassium within normal limits.  Check magnesium level.  Echocardiogram results pending.  Right middle lobe opacity with an expiratory wheezes: Currently on antibiotics and managed by primary team.   Established coronary disease with prior intervention: See above.   Acute  kidney injury most likely secondary to intravascular depletion due to underlying anemia and diuresis: Improving  Anemia: H&H trending down, currently followed by GI.   Hypertension with chronic kidney disease stage III: Currently on medications and monitor.   Insulin-dependent diabetes mellitus type 2: Currently managed by primary team.  Mixed hyperlipidemia: Continue statin therapy.  Former smoker: Educated on importance of continued smoking cessation.  Rex Kras, DO, [facility]. [place] Office: [phone]

## 2019-12-22 NOTE — Progress Notes (Signed)
CCMD called to advise 02 sats are in the low 80's. Went in to check on patient, pt is sleep. Pt easily aroused and this RN placed 2 liters of 02 on pt. 02 sats returned to 90's

## 2019-12-22 NOTE — Evaluation (Signed)
Physical Therapy Evaluation Patient Details Name: [name] MRN: [id number] DOB: 1951-05-31 Today's Date: 12/22/2019   History of Present Illness  Patient is a 69 y/o female who presents with chest pain and SOB. Found to have symptomatic anemia. Chest CT- PNA. PMH includes COPD, DM, tremor, tobacco abuse, amputation right hand, peripheral neuropathy, depression,  Clinical Impression  Patient presents with impaired balance, decreased activity tolerance and impaired mobility s/p above. Pt independent and lives alone PTA. Today, pt tolerated transfers and gait training with Min guard and supervision for safety. Noted to have some mild balance deficits but no overt LOB noted. Sp02 dropped to 84% on RA with mobility, able to maintain 02 in high 90s at rest on RA. Encouraged walking with nursing daily to improve oxygenation. Pt hoping to not require 02 at home. Will follow acutely to maximize independence and mobility prior to return home.    Follow Up Recommendations No PT follow up;Supervision - Intermittent    Equipment Recommendations  None recommended by PT    Recommendations for Other Services       Precautions / Restrictions Precautions Precautions: Other (comment) Precaution Comments: watch 02 Restrictions Weight Bearing Restrictions: No      Mobility  Bed Mobility               General bed mobility comments: Up in chair upon PT arrival.  Transfers Overall transfer level: Needs assistance Equipment used: None Transfers: Sit to/from Stand Sit to Stand: Modified independent (Device/Increase time)         General transfer comment: Stood from chair x2, from toilet x2.  Ambulation/Gait Ambulation/Gait assistance: Supervision;Min guard Gait Distance (Feet): 150 Feet Assistive device: None Gait Pattern/deviations: Step-through pattern;Decreased stride length;Staggering left;Staggering right   Gait velocity interpretation: 1.31 - 2.62 ft/sec, indicative of  limited community ambulator General Gait Details: Mildly unsteady gait with staggering to left/right at times but no overt LOB. 2/4 DOE. Sp02 dropped to 84% on RA. Cues for pursed lip breathing.  Stairs            Wheelchair Mobility    Modified Rankin (Stroke Patients Only)       Balance Overall balance assessment: Needs assistance Sitting-balance support: Feet supported;No upper extremity supported Sitting balance-Leahy Scale: Normal     Standing balance support: During functional activity Standing balance-Leahy Scale: Fair Standing balance comment: Able to wash up at sink without difficulty; incontinent of bowel due to having to take bowel prep for colonoscopy tomorrow.                             Pertinent Vitals/Pain Pain Assessment: Faces Faces Pain Scale: Hurts a little bit Pain Location: headache Pain Descriptors / Indicators: Headache Pain Intervention(s): Monitored during session    Home Living Family/patient expects to be discharged to:: Private residence Living Arrangements: Alone Available Help at Discharge: Family Type of Home: House Home Access: Ramped entrance;Stairs to enter   [employer] of Steps: a few steps in back Home Layout: One level Home Equipment: Cane - single point      Prior Function Level of Independence: Independent         Comments: Drives, works in her yard. Reports having difficulty walking longer distances due to LE pain.     Hand Dominance   Dominant Hand: Left    Extremity/Trunk Assessment   Upper Extremity Assessment Upper Extremity Assessment: (tremoring noted UEs- baseline)    Lower Extremity Assessment  Lower Extremity Assessment: RLE deficits/detail;LLE deficits/detail RLE Sensation: history of peripheral neuropathy LLE Sensation: history of peripheral neuropathy    Cervical / Trunk Assessment Cervical / Trunk Assessment: Normal  Communication   Communication: No difficulties   Cognition Arousal/Alertness: Awake/alert Behavior During Therapy: WFL for tasks assessed/performed Overall Cognitive Status: Within Functional Limits for tasks assessed                                        General Comments General comments (skin integrity, edema, etc.): Friend present during session. Sp02 dropped to 84% on RA with activity. Sp02 in high 90s on RA at rest.    Exercises     Assessment/Plan    PT Assessment Patient needs continued PT services  PT Problem List Decreased balance;Cardiopulmonary status limiting activity;Decreased activity tolerance;Pain       PT Treatment Interventions Therapeutic activities;Gait training;Therapeutic exercise;Patient/family education;Functional mobility training    PT Goals (Current goals can be found in the Care Plan section)  Acute Rehab PT Goals Patient Stated Goal: to go home and not need 02 PT Goal Formulation: With patient Time For Goal Achievement: 01/05/20 Potential to Achieve Goals: Good    Frequency Min 3X/week   Barriers to discharge Decreased caregiver support home alone    Co-evaluation               AM-PAC PT "6 Clicks" Mobility  Outcome Measure Help needed turning from your back to your side while in a flat bed without using bedrails?: None Help needed moving from lying on your back to sitting on the side of a flat bed without using bedrails?: None Help needed moving to and from a bed to a chair (including a wheelchair)?: None Help needed standing up from a chair using your arms (e.g., wheelchair or bedside chair)?: None Help needed to walk in hospital room?: None Help needed climbing 3-5 steps with a railing? : A Little 6 Click Score: 23    End of Session   Activity Tolerance: Treatment limited secondary to medical complications (Comment)(drop in SP02) Patient left: in chair;with call bell/phone within reach;with family/visitor present Nurse Communication: Mobility status PT Visit  Diagnosis: Unsteadiness on feet (R26.81)    Time: 1340-1413 PT Time Calculation (min) (ACUTE ONLY): 33 min   Charges:   PT Evaluation $PT Eval Moderate Complexity: 1 Mod PT Treatments $Gait Training: 8-22 mins        Marisa Severin, PT, DPT Acute Rehabilitation Services Pager [phone] Office [phone]      Marguarite Arbour A Sabra Heck 12/22/2019, 3:40 PM

## 2019-12-22 NOTE — H&P (View-Only) (Signed)
Subjective: Patient seen and examined while receiving nebulization treatment. She continues to have cough and expiratory wheezing. Her saturation is above 90% on 2 L oxygen via nasal cannula. She is ready to have an endoscopy and a colonoscopy tomorrow and states she will be able to finish a gallon of colonic prep today.  Objective: Vital signs in last 24 hours: Temp:  [98.1 F (36.7 C)-98.4 F (36.9 C)] 98.1 F (36.7 C) (03/09 0517) Pulse Rate:  [81-90] 87 (03/09 0517) Resp:  [20] 20 (03/08 1110) BP: (116-148)/(47-86) 148/86 (03/09 0517) SpO2:  [94 %-100 %] 99 % (03/09 0829) Weight:  [83.8 kg] 83.8 kg (03/09 0517) Weight change: 0.227 kg Last BM Date: 12/21/19  PE: Overweight, obvious pallor GENERAL: Not in distress ABDOMEN: Soft, distended but nontender, normal active bowel sounds EXTREMITIES: No deformity  Lab Results: Results for orders placed or performed during the hospital encounter of 12/19/19 (from the past 48 hour(s))  Glucose, capillary     Status: Abnormal   Collection Time: 12/20/19 12:01 PM  Result Value Ref Range   Glucose-Capillary 276 (H) 70 - 99 mg/dL    Comment: Glucose reference range applies only to samples taken after fasting for at least 8 hours.  Hemoglobin A1c     Status: Abnormal   Collection Time: 12/20/19 12:38 PM  Result Value Ref Range   Hgb A1c MFr Bld 8.2 (H) 4.8 - 5.6 %    Comment: (NOTE) Pre diabetes:          5.7%-6.4% Diabetes:              >6.4% Glycemic control for   <7.0% adults with diabetes    Mean Plasma Glucose 188.64 mg/dL    Comment: Performed at [facility] [address] [place], [place] [address]  Glucose, capillary     Status: Abnormal   Collection Time: 12/20/19  4:32 PM  Result Value Ref Range   Glucose-Capillary 373 (H) 70 - 99 mg/dL    Comment: Glucose reference range applies only to samples taken after fasting for at least 8 hours.  Glucose, capillary     Status: Abnormal   Collection Time: 12/20/19  9:00  PM  Result Value Ref Range   Glucose-Capillary 224 (H) 70 - 99 mg/dL    Comment: Glucose reference range applies only to samples taken after fasting for at least 8 hours.  Iron and TIBC     Status: Abnormal   Collection Time: 12/21/19  3:16 AM  Result Value Ref Range   Iron 12 (L) 28 - 170 ug/dL   TIBC 533 (H) 250 - 450 ug/dL   Saturation Ratios 2 (L) 10.4 - 31.8 %   UIBC 521 ug/dL    Comment: Performed at [facility], [place] [address] [place], [place] [address]  Ferritin     Status: Abnormal   Collection Time: 12/21/19  3:16 AM  Result Value Ref Range   Ferritin 9 (L) 11 - 307 ng/mL    Comment: Performed at [facility], [place] [address], [place], [place] [address]  CBC with Differential/Platelet     Status: Abnormal   Collection Time: 12/21/19  3:16 AM  Result Value Ref Range   WBC 6.5 4.0 - 10.5 K/uL   RBC 3.04 (L) 3.87 - 5.11 MIL/uL   Hemoglobin 7.8 (L) 12.0 - 15.0 g/dL   HCT 26.3 (L) 36.0 - 46.0 %   MCV 86.5 80.0 - 100.0 fL   MCH 25.7 (  L) 26.0 - 34.0 pg   MCHC 29.7 (L) 30.0 - 36.0 g/dL   RDW 16.5 (H) 11.5 - 15.5 %   Platelets 254 150 - 400 K/uL   nRBC 0.0 0.0 - 0.2 %   Neutrophils Relative % 80 %   Neutro Abs 5.3 1.7 - 7.7 K/uL   Lymphocytes Relative 14 %   Lymphs Abs 0.9 0.7 - 4.0 K/uL   Monocytes Relative 5 %   Monocytes Absolute 0.3 0.1 - 1.0 K/uL   Eosinophils Relative 0 %   Eosinophils Absolute 0.0 0.0 - 0.5 K/uL   Basophils Relative 0 %   Basophils Absolute 0.0 0.0 - 0.1 K/uL   Immature Granulocytes 1 %   Abs Immature Granulocytes 0.06 0.00 - 0.07 K/uL    Comment: Performed at [facility] [address] [place], [place] [address]  Basic metabolic panel     Status: Abnormal   Collection Time: 12/21/19  3:16 AM  Result Value Ref Range   Sodium 135 135 - 145 mmol/L   Potassium 4.6 3.5 - 5.1 mmol/L   Chloride 101 98 - 111 mmol/L   CO2 23 22 - 32 mmol/L   Glucose, Bld 208 (H) 70 - 99 mg/dL    Comment: Glucose reference range applies only to  samples taken after fasting for at least 8 hours.   BUN 32 (H) 8 - 23 mg/dL   Creatinine, Ser 1.51 (H) 0.44 - 1.00 mg/dL   Calcium 8.6 (L) 8.9 - 10.3 mg/dL   GFR calc non Af Amer 35 (L) >60 mL/min   GFR calc Af Amer 41 (L) >60 mL/min   Anion gap 11 5 - 15    Comment: Performed at [facility] [address] [place], [place] [address]  Protime-INR     Status: None   Collection Time: 12/21/19 11:19 AM  Result Value Ref Range   Prothrombin Time 15.1 11.4 - 15.2 seconds   INR 1.2 0.8 - 1.2    Comment: (NOTE) INR goal varies based on device and disease states. Performed at [facility], [place] [address] [place], [place] [address]   Basic metabolic panel     Status: Abnormal   Collection Time: 12/22/19  3:13 AM  Result Value Ref Range   Sodium 137 135 - 145 mmol/L   Potassium 5.1 3.5 - 5.1 mmol/L   Chloride 105 98 - 111 mmol/L   CO2 21 (L) 22 - 32 mmol/L   Glucose, Bld 274 (H) 70 - 99 mg/dL    Comment: Glucose reference range applies only to samples taken after fasting for at least 8 hours.   BUN 36 (H) 8 - 23 mg/dL   Creatinine, Ser 1.49 (H) 0.44 - 1.00 mg/dL   Calcium 8.8 (L) 8.9 - 10.3 mg/dL   GFR calc non Af Amer 36 (L) >60 mL/min   GFR calc Af Amer 41 (L) >60 mL/min   Anion gap 11 5 - 15    Comment: Performed at [facility] [address], [place], [place] [address]  CBC with Differential/Platelet     Status: Abnormal   Collection Time: 12/22/19  3:45 AM  Result Value Ref Range   WBC 6.7 4.0 - 10.5 K/uL   RBC 2.80 (L) 3.87 - 5.11 MIL/uL   Hemoglobin 7.4 (L) 12.0 - 15.0 g/dL   HCT 24.3 (L) 36.0 - 46.0 %   MCV 86.8 80.0 - 100.0 fL   MCH 26.4  26.0 - 34.0 pg   MCHC 30.5 30.0 - 36.0 g/dL   RDW 16.4 (H) 11.5 - 15.5 %   Platelets 254 150 - 400 K/uL   nRBC 0.0 0.0 - 0.2 %   Neutrophils Relative % 83 %   Neutro Abs 5.4 1.7 - 7.7 K/uL   Lymphocytes Relative 11 %   Lymphs Abs 0.8 0.7 - 4.0 K/uL   Monocytes Relative 5 %   Monocytes Absolute 0.4 0.1 - 1.0  K/uL   Eosinophils Relative 0 %   Eosinophils Absolute 0.0 0.0 - 0.5 K/uL   Basophils Relative 0 %   Basophils Absolute 0.0 0.0 - 0.1 K/uL   Immature Granulocytes 1 %   Abs Immature Granulocytes 0.09 (H) 0.00 - 0.07 K/uL    Comment: Performed at [facility] [address], [place], [place] [address]  Glucose, capillary     Status: Abnormal   Collection Time: 12/22/19  7:41 AM  Result Value Ref Range   Glucose-Capillary 297 (H) 70 - 99 mg/dL    Comment: Glucose reference range applies only to samples taken after fasting for at least 8 hours.    Studies/Results: DG Chest Port 1 View  Result Date: 12/21/2019 CLINICAL DATA:  Shortness of breath. EXAM: PORTABLE CHEST 1 VIEW COMPARISON:  December 19, 2019. FINDINGS: Stable cardiomediastinal silhouette. Atherosclerosis of thoracic aorta is noted. No pneumothorax or pleural effusion is noted. Mild bibasilar subsegmental atelectasis is noted. Stable right middle lobe density is noted concerning for pneumonia, atelectasis or mass. Bony thorax is unremarkable. IMPRESSION: Stable right middle lobe density is noted concerning for pneumonia, atelectasis or mass. Continued radiographic follow-up is recommended. Mild bibasilar subsegmental atelectasis is noted. Aortic Atherosclerosis (ICD10-I70.0). Electronically Signed   By: Marijo Conception M.D.   On: 12/21/2019 13:46   US Abdomen Limited RUQ  Result Date: 12/21/2019 CLINICAL DATA:  Cirrhosis, search for ascites EXAM: ULTRASOUND ABDOMEN LIMITED RIGHT UPPER QUADRANT COMPARISON:  CT chest 02/18/2020 FINDINGS: Gallbladder: Surgically absent Common bile duct: Diameter: 0.5 cm Liver: No focal lesion identified. Nodular contour with diffuse coarse echotexture. Portal vein is patent on color Doppler imaging with normal direction of blood flow towards the liver. There is trace perihepatic ascites. Other: Small right pleural effusion. IMPRESSION: 1.  Trace perihepatic ascites. 2.  Cirrhotic liver. 3.  Small right  pleural effusion. Electronically Signed   By: Audie Pinto M.D.   On: 12/21/2019 12:56    Medications: I have reviewed the patient's current medications.  Assessment: Cirrhotic liver noted on ultrasound from yesterday with trace perihepatic ascites and small right pleural effusion Normocytic anemia without thrombocytopenia, with marked iron deficiency, saturation 2% and ferritin only 9 Elevated BUN/creatinine ratio of 36/1.49 Normal PT/INR of 15.1/1.2 MELD Na of 15  Right middle lobe opacity, being treated as pneumonia Elevation of high-sensitivity troponins, possible left heart catheterization after EGD and colonoscopy Hypertension, type 2 diabetes, dyslipidemia, former smoker  Plan: EGD tomorrow to screen for esophageal varices and possible banding if needed tomorrow. Colonoscopy tomorrow for history of adenoma and iron deficiency, last colonoscopy 2015. Clear liquid diet, n.p.o. post midnight, colonic prep for today. Okay to continue aspirin 81 mg and Lovenox for now. The risks and the benefits of the procedure were discussed with the patient in details, she verbalized understanding and consents.  Ronnette Juniper, MD 12/22/2019, 8:43 AM

## 2019-12-22 NOTE — Progress Notes (Signed)
PROGRESS NOTE    [name]  IBB:[id number] DOB: 01/17/1951 DOA: 12/19/2019 PCP: Lucia Gaskins, MD   Brief Narrative:  Patient is a 69 year old female with history of COPD, diabetes type 2, hyperlipidemia, hypothyroidism, prior history of DVT not on anticoagulation, anxiety, depression who presents at [facility] with complaints of chest pain, shortness of breath for 3 days.  She complained of dyspnea on exertion.  She also complained of left-sided chest pain. In the emergency department, chest x-ray showed opacity in the right midlung field.  Chest CT confirmed pneumonia.  Started on antibiotics.  She was also found to have elevated troponin.  Cardiology was consulted by the emergency physician and transferred to [facility]. Her chest pain has currently resolved.  EKG had some T wave inversion in lead I and aVL.  She had troponin elevation which could be from demand ischemia.  She has known history of coronary artery disease with multiple risk factors. BNP was elevated to 417.  She was given IV Lasix.  Echocardiogram showed ejection fraction of 55 to 45%, grade 2 diastolic dysfunction. Patient was also wheezing on presentation so she has been started on bronchodilators, steroids. Lab work showed normocytic anemia with hemoglobin in the range of 7.  She reported bloody bowel movement several months ago but recently her stools are brown and she has not noticed any change in the color.  GI consulted, plan for EGD/colonoscopy tomorrow.  Assessment & Plan:   Active Problems:   Insulin dependent type 2 diabetes mellitus (HCC)   Mixed hyperlipidemia   Coronary artery disease   AKI (acute kidney injury) ([place])   Chest pain   Anemia   Dyspnea   Hx of heart artery stent   Benign hypertension with CKD (chronic kidney disease) stage III   Former smoker   Acute respiratory failure with hypoxia: CT chest done on presentation did not show any pulmonary embolus but showed focal masslike lesion in the  right middle lobe.  Being treated for community-acquired pneumonia.  Hypoxia most likely secondary  To community-acquired pneumonia, congestive heart failure and COPD. Chest x-ray done on 12/21/2019 showed stable right middle lobe density is noted concerning for pneumonia, atelectasis or mass.  This morning she was maintaining her saturation without oxygen.  Chest pain: Elevated troponin on presentation, trended down.  Currently chest pain-free.  EKG showed T wave inversions in lead I, aVL.  Cardiology following.  Possible type II MI due to supply demand ischemia secondary to congestive heart failure.  Further recommendation as per cardiology.  Coronary artery disease: History of coronary artery disease with multiple risk factors.  Currently chest pain-free.  Congestive heart failure: Elevated BNP on presentation.  Given a dose of Lasix IV.  Echocardiogram showed ejection fraction of 55 to 03%, grade 2 diastolic dysfunction.  Currently euvolemic.  Normocytic anemia: Hemoglobin in the range of 7.  Her last hemoglobin in 09/2015 was 11.8.  Patient reported bloody bowel movements few months ago but currently her stool is brown. Iron studies show severe iron deficiency with low iron and ferritin. S/p  a dose of IV iron.  GI consulted, plan for EGD/colonoscopy tomorrow  COPD exacerbation: Presented with wheezes.  Started on steroids, bronchodilators.  Currently on supplemental oxygen. Not on oxygen at home.  We will taper the steroids to oral tomorrow.  Diabetes type 2: Continue current regimen.  Continue to monitor CBGs.  Hemoglobins of 8.2.  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  Creatinine  close to baseline.           DVT prophylaxis:Lovenox Code Status: Full Family Communication: Discussed with the patient. Disposition Plan: Patient is from home.  Clinically not ready for discharge because  she is getting work-up for possible GI bleed, cardiology has  not cleared her for discharge.  Expect discharge to home when ready.     Consultants: Cardiology  Procedures:None  Antimicrobials:  Anti-infectives (From admission, onward)   Start     Dose/Rate Route Frequency Ordered Stop   12/20/19 2000  azithromycin (ZITHROMAX) 500 mg in sodium chloride 0.9 % 250 mL IVPB     500 mg 250 mL/hr over 60 Minutes Intravenous Every 24 hours 12/20/19 0315     12/20/19 1800  cefTRIAXone (ROCEPHIN) 1 g in sodium chloride 0.9 % 100 mL IVPB     1 g 200 mL/hr over 30 Minutes Intravenous Every 24 hours 12/20/19 0315     12/19/19 1845  cefTRIAXone (ROCEPHIN) 1 g in sodium chloride 0.9 % 100 mL IVPB     1 g 200 mL/hr over 30 Minutes Intravenous  Once 12/19/19 1840 12/19/19 1932   12/19/19 1845  azithromycin (ZITHROMAX) 500 mg in sodium chloride 0.9 % 250 mL IVPB     500 mg 250 mL/hr over 60 Minutes Intravenous  Once 12/19/19 1840 12/19/19 2325      Subjective: Patient seen and examined at the bedside this morning.  Hemodynamically stable.  Her respiratory status has significantly improved today.  Denies any shortness of breath, cough.  She was maintaining her saturation on room air this morning.  Objective: Vitals:   12/21/19 1952 12/21/19 1954 12/21/19 2007 12/22/19 0517  BP:   (!) 116/47 (!) 148/86  Pulse:   90 87  Resp:      Temp:   98.2 F (36.8 C) 98.1 F (36.7 C)  TempSrc:   Oral Oral  SpO2: 94% 98% 96% 96%  Weight:    83.8 kg  Height:        Intake/Output Summary (Last 24 hours) at 12/22/2019 0745 Last data filed at 12/22/2019 0400 Gross per 24 hour  Intake 1690.2 ml  Output --  Net 1690.2 ml   Filed Weights   12/20/19 1103 12/21/19 0335 12/22/19 0517  Weight: 83.6 kg 83.8 kg 83.8 kg    Examination:   General exam: Comfortable Respiratory system: Minimal expiratory wheezes Cardiovascular system: S1 & S2 heard, RRR. No JVD, murmurs, rubs, gallops or clicks. Gastrointestinal system: Abdomen is nondistended, soft and nontender. No  organomegaly or masses felt. Normal bowel sounds heard. Central nervous system: Alert and oriented. No focal neurological deficits. Extremities: No edema, no clubbing ,no cyanosis, amputation of right hand, tremors Skin: No rashes, lesions or ulcers,no icterus ,no pallor    Data Reviewed: I have personally reviewed following labs and imaging studies  CBC: Recent Labs  Lab 12/19/19 1710 12/20/19 0437 12/21/19 0316 12/22/19 0345  WBC 6.5 6.2 6.5 6.7  NEUTROABS 3.9  --  5.3 5.4  HGB 8.1* 7.7* 7.8* 7.4*  HCT 27.8* 26.1* 26.3* 24.3*  MCV 89.1 89.7 86.5 86.8  PLT 246 231 254 287   Basic Metabolic Panel: Recent Labs  Lab 12/19/19 1710 12/20/19 0437 12/21/19 0316 12/22/19 0313  NA 133* 133* 135 137  K 5.2* 5.7* 4.6 5.1  CL 102 102 101 105  CO2 24 23 23  21*  GLUCOSE 123* 196* 208* 274*  BUN 19 23 32* 36*  CREATININE 1.23* 1.34* 1.51* 1.49*  CALCIUM  9.2 8.8* 8.6* 8.8*   GFR: Estimated Creatinine Clearance: 39.4 mL/min (A) (by C-G formula based on SCr of 1.49 mg/dL (H)). Liver Function Tests: Recent Labs  Lab 12/19/19 1710 12/20/19 0437  AST 15 15  ALT 13 12  ALKPHOS 61 61  BILITOT 0.5 0.3  PROT 7.5 7.1  ALBUMIN 3.6 3.5   No results for input(s): LIPASE, AMYLASE in the last 168 hours. No results for input(s): AMMONIA in the last 168 hours. Coagulation Profile: Recent Labs  Lab 12/21/19 1119  INR 1.2   Cardiac Enzymes: No results for input(s): CKTOTAL, CKMB, CKMBINDEX, TROPONINI in the last 168 hours. BNP (last 3 results) No results for input(s): PROBNP in the last 8760 hours. HbA1C: Recent Labs    12/20/19 1238  HGBA1C 8.2*   CBG: Recent Labs  Lab 12/19/19 1659 12/20/19 1201 12/20/19 1632 12/20/19 2100  GLUCAP 121* 276* 373* 224*   Lipid Profile: No results for input(s): CHOL, HDL, LDLCALC, TRIG, CHOLHDL, LDLDIRECT in the last 72 hours. Thyroid Function Tests: No results for input(s): TSH, T4TOTAL, FREET4, T3FREE, THYROIDAB in the last 72  hours. Anemia Panel: Recent Labs    12/21/19 0316  FERRITIN 9*  TIBC 533*  IRON 12*   Sepsis Labs: No results for input(s): PROCALCITON, LATICACIDVEN in the last 168 hours.  Recent Results (from the past 240 hour(s))  Respiratory Panel by RT PCR (Flu A&B, Covid) - Nasopharyngeal Swab     Status: None   Collection Time: 12/20/19 12:30 AM   Specimen: Nasopharyngeal Swab  Result Value Ref Range Status   SARS Coronavirus 2 by RT PCR NEGATIVE NEGATIVE Final    Comment: (NOTE) SARS-CoV-2 target nucleic acids are NOT DETECTED. The SARS-CoV-2 RNA is generally detectable in upper respiratoy specimens during the acute phase of infection. The lowest concentration of SARS-CoV-2 viral copies this assay can detect is 131 copies/mL. A negative result does not preclude SARS-Cov-2 infection and should not be used as the sole basis for treatment or other patient management decisions. A negative result may occur with  improper specimen collection/handling, submission of specimen other than nasopharyngeal swab, presence of viral mutation(s) within the areas targeted by this assay, and inadequate number of viral copies (<131 copies/mL). A negative result must be combined with clinical observations, patient history, and epidemiological information. The expected result is Negative. Fact Sheet for Patients:  PinkCheek.be Fact Sheet for Healthcare Providers:  GravelBags.it This test is not yet ap proved or cleared by the [place] FDA and  has been authorized for detection and/or diagnosis of SARS-CoV-2 by FDA under an Emergency Use Authorization (EUA). This EUA will remain  in effect (meaning this test can be used) for the duration of the COVID-19 declaration under Section 564(b)(1) of the Act, 21 U.S.C. section 360bbb-3(b)(1), unless the authorization is terminated or revoked sooner.    Influenza A by PCR NEGATIVE NEGATIVE Final    Influenza B by PCR NEGATIVE NEGATIVE Final    Comment: (NOTE) The Xpert Xpress SARS-CoV-2/FLU/RSV assay is intended as an aid in  the diagnosis of influenza from Nasopharyngeal swab specimens and  should not be used as a sole basis for treatment. Nasal washings and  aspirates are unacceptable for Xpert Xpress SARS-CoV-2/FLU/RSV  testing. Fact Sheet for Patients: PinkCheek.be Fact Sheet for Healthcare Providers: GravelBags.it This test is not yet approved or cleared by the [place] FDA and  has been authorized for detection and/or diagnosis of SARS-CoV-2 by  FDA under an Emergency Use Authorization (EUA). This  EUA will remain  in effect (meaning this test can be used) for the duration of the  Covid-19 declaration under Section 564(b)(1) of the Act, 21  U.S.C. section 360bbb-3(b)(1), unless the authorization is  terminated or revoked. Performed at [facility], [address] [place], [place] [address]          Radiology Studies: [facility] Chest [facility] 1 View  Result Date: 12/21/2019 CLINICAL DATA:  Shortness of breath. EXAM: PORTABLE CHEST 1 VIEW COMPARISON:  December 19, 2019. FINDINGS: Stable cardiomediastinal silhouette. Atherosclerosis of thoracic aorta is noted. No pneumothorax or pleural effusion is noted. Mild bibasilar subsegmental atelectasis is noted. Stable right middle lobe density is noted concerning for pneumonia, atelectasis or mass. Bony thorax is unremarkable. IMPRESSION: Stable right middle lobe density is noted concerning for pneumonia, atelectasis or mass. Continued radiographic follow-up is recommended. Mild bibasilar subsegmental atelectasis is noted. Aortic Atherosclerosis (ICD10-I70.0). Electronically Signed   By: Marijo Conception M.D.   On: 12/21/2019 13:46   US Abdomen Limited RUQ  Result Date: 12/21/2019 CLINICAL DATA:  Cirrhosis, search for ascites EXAM: ULTRASOUND ABDOMEN LIMITED RIGHT UPPER QUADRANT  COMPARISON:  CT chest 02/18/2020 FINDINGS: Gallbladder: Surgically absent Common bile duct: Diameter: 0.5 cm Liver: No focal lesion identified. Nodular contour with diffuse coarse echotexture. Portal vein is patent on color Doppler imaging with normal direction of blood flow towards the liver. There is trace perihepatic ascites. Other: Small right pleural effusion. IMPRESSION: 1.  Trace perihepatic ascites. 2.  Cirrhotic liver. 3.  Small right pleural effusion. Electronically Signed   By: Audie Pinto M.D.   On: 12/21/2019 12:56        Scheduled Meds: . arformoterol  15 mcg Nebulization BID  . aspirin EC  81 mg Oral Daily  . budesonide (PULMICORT) nebulizer solution  0.5 mg Nebulization BID  . cilostazol  100 mg Oral BID AC  . enoxaparin (LOVENOX) injection  40 mg Subcutaneous Q24H  . insulin aspart  0-9 Units Subcutaneous TID WC  . insulin glargine  20 Units Subcutaneous Daily  . ipratropium-albuterol  3 mL Nebulization TID  . levothyroxine  125 mcg Oral Q0600  . LORazepam  1 mg Oral BID  . methylPREDNISolone (SOLU-MEDROL) injection  40 mg Intravenous Q12H  . pantoprazole  40 mg Oral BID  . PARoxetine  20 mg Oral Daily  . pravastatin  40 mg Oral q1800  . pregabalin  75 mg Oral Daily  . sodium chloride flush  3 mL Intravenous Q12H  . traZODone  100 mg Oral QHS   Continuous Infusions: . sodium chloride    . azithromycin Stopped (12/22/19 0006)  . cefTRIAXone (ROCEPHIN)  IV Stopped (12/21/19 1728)  . nitroGLYCERIN       LOS: 2 days    Time spent: 25 mins.More than 50% of that time was spent in counseling and/or coordination of care.      Shelly Coss, MD [facility] P3/06/2020, 7:45 AM

## 2019-12-22 NOTE — Progress Notes (Signed)
Patient refused to have blood sugar checked at bedtime.

## 2019-12-22 NOTE — Progress Notes (Signed)
Subjective: Patient seen and examined while receiving nebulization treatment. She continues to have cough and expiratory wheezing. Her saturation is above 90% on 2 L oxygen via nasal cannula. She is ready to have an endoscopy and a colonoscopy tomorrow and states she will be able to finish a gallon of colonic prep today.  Objective: Vital signs in last 24 hours: Temp:  [98.1 F (36.7 C)-98.4 F (36.9 C)] 98.1 F (36.7 C) (03/09 0517) Pulse Rate:  [81-90] 87 (03/09 0517) Resp:  [20] 20 (03/08 1110) BP: (116-148)/(47-86) 148/86 (03/09 0517) SpO2:  [94 %-100 %] 99 % (03/09 0829) Weight:  [83.8 kg] 83.8 kg (03/09 0517) Weight change: 0.227 kg Last BM Date: 12/21/19  PE: Overweight, obvious pallor GENERAL: Not in distress ABDOMEN: Soft, distended but nontender, normal active bowel sounds EXTREMITIES: No deformity  Lab Results: Results for orders placed or performed during the hospital encounter of 12/19/19 (from the past 48 hour(s))  Glucose, capillary     Status: Abnormal   Collection Time: 12/20/19 12:01 PM  Result Value Ref Range   Glucose-Capillary 276 (H) 70 - 99 mg/dL    Comment: Glucose reference range applies only to samples taken after fasting for at least 8 hours.  Hemoglobin A1c     Status: Abnormal   Collection Time: 12/20/19 12:38 PM  Result Value Ref Range   Hgb A1c MFr Bld 8.2 (H) 4.8 - 5.6 %    Comment: (NOTE) Pre diabetes:          5.7%-6.4% Diabetes:              >6.4% Glycemic control for   <7.0% adults with diabetes    Mean Plasma Glucose 188.64 mg/dL    Comment: Performed at [facility] [address] [place], [place] [address]  Glucose, capillary     Status: Abnormal   Collection Time: 12/20/19  4:32 PM  Result Value Ref Range   Glucose-Capillary 373 (H) 70 - 99 mg/dL    Comment: Glucose reference range applies only to samples taken after fasting for at least 8 hours.  Glucose, capillary     Status: Abnormal   Collection Time: 12/20/19  9:00  PM  Result Value Ref Range   Glucose-Capillary 224 (H) 70 - 99 mg/dL    Comment: Glucose reference range applies only to samples taken after fasting for at least 8 hours.  Iron and TIBC     Status: Abnormal   Collection Time: 12/21/19  3:16 AM  Result Value Ref Range   Iron 12 (L) 28 - 170 ug/dL   TIBC 533 (H) 250 - 450 ug/dL   Saturation Ratios 2 (L) 10.4 - 31.8 %   UIBC 521 ug/dL    Comment: Performed at [facility], [place] [address] [place], [place] [address]  Ferritin     Status: Abnormal   Collection Time: 12/21/19  3:16 AM  Result Value Ref Range   Ferritin 9 (L) 11 - 307 ng/mL    Comment: Performed at [facility], [place] [address] [place], [place] [address]  CBC with Differential/Platelet     Status: Abnormal   Collection Time: 12/21/19  3:16 AM  Result Value Ref Range   WBC 6.5 4.0 - 10.5 K/uL   RBC 3.04 (L) 3.87 - 5.11 MIL/uL   Hemoglobin 7.8 (L) 12.0 - 15.0 g/dL   HCT 26.3 (L) 36.0 - 46.0 %   MCV 86.5 80.0 - 100.0 fL   MCH 25.7 (  L) 26.0 - 34.0 pg   MCHC 29.7 (L) 30.0 - 36.0 g/dL   RDW 16.5 (H) 11.5 - 15.5 %   Platelets 254 150 - 400 K/uL   nRBC 0.0 0.0 - 0.2 %   Neutrophils Relative % 80 %   Neutro Abs 5.3 1.7 - 7.7 K/uL   Lymphocytes Relative 14 %   Lymphs Abs 0.9 0.7 - 4.0 K/uL   Monocytes Relative 5 %   Monocytes Absolute 0.3 0.1 - 1.0 K/uL   Eosinophils Relative 0 %   Eosinophils Absolute 0.0 0.0 - 0.5 K/uL   Basophils Relative 0 %   Basophils Absolute 0.0 0.0 - 0.1 K/uL   Immature Granulocytes 1 %   Abs Immature Granulocytes 0.06 0.00 - 0.07 K/uL    Comment: Performed at [facility] [address], [place], [place] [address]  Basic metabolic panel     Status: Abnormal   Collection Time: 12/21/19  3:16 AM  Result Value Ref Range   Sodium 135 135 - 145 mmol/L   Potassium 4.6 3.5 - 5.1 mmol/L   Chloride 101 98 - 111 mmol/L   CO2 23 22 - 32 mmol/L   Glucose, Bld 208 (H) 70 - 99 mg/dL    Comment: Glucose reference range applies only to  samples taken after fasting for at least 8 hours.   BUN 32 (H) 8 - 23 mg/dL   Creatinine, Ser 1.51 (H) 0.44 - 1.00 mg/dL   Calcium 8.6 (L) 8.9 - 10.3 mg/dL   GFR calc non Af Amer 35 (L) >60 mL/min   GFR calc Af Amer 41 (L) >60 mL/min   Anion gap 11 5 - 15    Comment: Performed at [facility] [address] [place], [place] [address]  Protime-INR     Status: None   Collection Time: 12/21/19 11:19 AM  Result Value Ref Range   Prothrombin Time 15.1 11.4 - 15.2 seconds   INR 1.2 0.8 - 1.2    Comment: (NOTE) INR goal varies based on device and disease states. Performed at [facility], [place] [address], [place], [place] [address]   Basic metabolic panel     Status: Abnormal   Collection Time: 12/22/19  3:13 AM  Result Value Ref Range   Sodium 137 135 - 145 mmol/L   Potassium 5.1 3.5 - 5.1 mmol/L   Chloride 105 98 - 111 mmol/L   CO2 21 (L) 22 - 32 mmol/L   Glucose, Bld 274 (H) 70 - 99 mg/dL    Comment: Glucose reference range applies only to samples taken after fasting for at least 8 hours.   BUN 36 (H) 8 - 23 mg/dL   Creatinine, Ser 1.49 (H) 0.44 - 1.00 mg/dL   Calcium 8.8 (L) 8.9 - 10.3 mg/dL   GFR calc non Af Amer 36 (L) >60 mL/min   GFR calc Af Amer 41 (L) >60 mL/min   Anion gap 11 5 - 15    Comment: Performed at [facility] [address] [place], [place] [address]  CBC with Differential/Platelet     Status: Abnormal   Collection Time: 12/22/19  3:45 AM  Result Value Ref Range   WBC 6.7 4.0 - 10.5 K/uL   RBC 2.80 (L) 3.87 - 5.11 MIL/uL   Hemoglobin 7.4 (L) 12.0 - 15.0 g/dL   HCT 24.3 (L) 36.0 - 46.0 %   MCV 86.8 80.0 - 100.0 fL   MCH 26.4  26.0 - 34.0 pg   MCHC 30.5 30.0 - 36.0 g/dL   RDW 16.4 (H) 11.5 - 15.5 %   Platelets 254 150 - 400 K/uL   nRBC 0.0 0.0 - 0.2 %   Neutrophils Relative % 83 %   Neutro Abs 5.4 1.7 - 7.7 K/uL   Lymphocytes Relative 11 %   Lymphs Abs 0.8 0.7 - 4.0 K/uL   Monocytes Relative 5 %   Monocytes Absolute 0.4 0.1 - 1.0  K/uL   Eosinophils Relative 0 %   Eosinophils Absolute 0.0 0.0 - 0.5 K/uL   Basophils Relative 0 %   Basophils Absolute 0.0 0.0 - 0.1 K/uL   Immature Granulocytes 1 %   Abs Immature Granulocytes 0.09 (H) 0.00 - 0.07 K/uL    Comment: Performed at [facility] [address], [place], [place] [address]  Glucose, capillary     Status: Abnormal   Collection Time: 12/22/19  7:41 AM  Result Value Ref Range   Glucose-Capillary 297 (H) 70 - 99 mg/dL    Comment: Glucose reference range applies only to samples taken after fasting for at least 8 hours.    Studies/Results: DG Chest Port 1 View  Result Date: 12/21/2019 CLINICAL DATA:  Shortness of breath. EXAM: PORTABLE CHEST 1 VIEW COMPARISON:  December 19, 2019. FINDINGS: Stable cardiomediastinal silhouette. Atherosclerosis of thoracic aorta is noted. No pneumothorax or pleural effusion is noted. Mild bibasilar subsegmental atelectasis is noted. Stable right middle lobe density is noted concerning for pneumonia, atelectasis or mass. Bony thorax is unremarkable. IMPRESSION: Stable right middle lobe density is noted concerning for pneumonia, atelectasis or mass. Continued radiographic follow-up is recommended. Mild bibasilar subsegmental atelectasis is noted. Aortic Atherosclerosis (ICD10-I70.0). Electronically Signed   By: Marijo Conception M.D.   On: 12/21/2019 13:46   US Abdomen Limited RUQ  Result Date: 12/21/2019 CLINICAL DATA:  Cirrhosis, search for ascites EXAM: ULTRASOUND ABDOMEN LIMITED RIGHT UPPER QUADRANT COMPARISON:  CT chest 02/18/2020 FINDINGS: Gallbladder: Surgically absent Common bile duct: Diameter: 0.5 cm Liver: No focal lesion identified. Nodular contour with diffuse coarse echotexture. Portal vein is patent on color Doppler imaging with normal direction of blood flow towards the liver. There is trace perihepatic ascites. Other: Small right pleural effusion. IMPRESSION: 1.  Trace perihepatic ascites. 2.  Cirrhotic liver. 3.  Small right  pleural effusion. Electronically Signed   By: Audie Pinto M.D.   On: 12/21/2019 12:56    Medications: I have reviewed the patient's current medications.  Assessment: Cirrhotic liver noted on ultrasound from yesterday with trace perihepatic ascites and small right pleural effusion Normocytic anemia without thrombocytopenia, with marked iron deficiency, saturation 2% and ferritin only 9 Elevated BUN/creatinine ratio of 36/1.49 Normal PT/INR of 15.1/1.2 MELD Na of 15  Right middle lobe opacity, being treated as pneumonia Elevation of high-sensitivity troponins, possible left heart catheterization after EGD and colonoscopy Hypertension, type 2 diabetes, dyslipidemia, former smoker  Plan: EGD tomorrow to screen for esophageal varices and possible banding if needed tomorrow. Colonoscopy tomorrow for history of adenoma and iron deficiency, last colonoscopy 2015. Clear liquid diet, n.p.o. post midnight, colonic prep for today. Okay to continue aspirin 81 mg and Lovenox for now. The risks and the benefits of the procedure were discussed with the patient in details, she verbalized understanding and consents.  Ronnette Juniper, MD 12/22/2019, 8:43 AM

## 2019-12-23 ENCOUNTER — Inpatient Hospital Stay (HOSPITAL_COMMUNITY): Payer: Medicare HMO | Admitting: Certified Registered Nurse Anesthetist

## 2019-12-23 ENCOUNTER — Encounter (HOSPITAL_COMMUNITY)
Admission: EM | Disposition: A | Discharge status: Home / Self Care | Payer: Self-pay | Source: Home / Self Care | Attending: Internal Medicine

## 2019-12-23 ENCOUNTER — Encounter (HOSPITAL_COMMUNITY): Payer: Self-pay | Admitting: Family Medicine

## 2019-12-23 HISTORY — PX: HOT HEMOSTASIS: SHX5433

## 2019-12-23 HISTORY — PX: COLONOSCOPY WITH PROPOFOL: SHX5780

## 2019-12-23 HISTORY — PX: HEMOSTASIS CLIP PLACEMENT: SHX6857

## 2019-12-23 HISTORY — PX: POLYPECTOMY: SHX5525

## 2019-12-23 HISTORY — PX: ESOPHAGOGASTRODUODENOSCOPY (EGD) WITH PROPOFOL: SHX5813

## 2019-12-23 LAB — CBC WITH DIFFERENTIAL/PLATELET
Abs Immature Granulocytes: 0.05 10*3/uL (ref 0.00–0.07)
Basophils Absolute: 0 10*3/uL (ref 0.0–0.1)
Basophils Relative: 0 %
Eosinophils Absolute: 0.1 10*3/uL (ref 0.0–0.5)
Eosinophils Relative: 1 %
HCT: 26.4 % — ABNORMAL LOW (ref 36.0–46.0)
Hemoglobin: 7.8 g/dL — ABNORMAL LOW (ref 12.0–15.0)
Immature Granulocytes: 1 %
Lymphocytes Relative: 22 %
Lymphs Abs: 1.3 10*3/uL (ref 0.7–4.0)
MCH: 25.7 pg — ABNORMAL LOW (ref 26.0–34.0)
MCHC: 29.5 g/dL — ABNORMAL LOW (ref 30.0–36.0)
MCV: 86.8 fL (ref 80.0–100.0)
Monocytes Absolute: 0.5 10*3/uL (ref 0.1–1.0)
Monocytes Relative: 8 %
Neutro Abs: 3.9 10*3/uL (ref 1.7–7.7)
Neutrophils Relative %: 68 %
Platelets: 242 10*3/uL (ref 150–400)
RBC: 3.04 MIL/uL — ABNORMAL LOW (ref 3.87–5.11)
RDW: 16.8 % — ABNORMAL HIGH (ref 11.5–15.5)
WBC: 5.8 10*3/uL (ref 4.0–10.5)
nRBC: 0 % (ref 0.0–0.2)

## 2019-12-23 LAB — BASIC METABOLIC PANEL
Anion gap: 8 (ref 5–15)
BUN: 19 mg/dL (ref 8–23)
CO2: 27 mmol/L (ref 22–32)
Calcium: 8.8 mg/dL — ABNORMAL LOW (ref 8.9–10.3)
Chloride: 108 mmol/L (ref 98–111)
Creatinine, Ser: 1.1 mg/dL — ABNORMAL HIGH (ref 0.44–1.00)
GFR calc Af Amer: 60 mL/min — ABNORMAL LOW (ref >60)
GFR calc non Af Amer: 52 mL/min — ABNORMAL LOW (ref >60)
Glucose, Bld: 75 mg/dL (ref 70–99)
Potassium: 3.6 mmol/L (ref 3.5–5.1)
Sodium: 143 mmol/L (ref 135–145)

## 2019-12-23 LAB — GLUCOSE, CAPILLARY: Glucose-Capillary: 361 mg/dL — ABNORMAL HIGH (ref 70–99)

## 2019-12-23 LAB — MAGNESIUM: Magnesium: 2 mg/dL (ref 1.7–2.4)

## 2019-12-23 SURGERY — ESOPHAGOGASTRODUODENOSCOPY (EGD) WITH PROPOFOL
Anesthesia: Monitor Anesthesia Care

## 2019-12-23 MED ORDER — SODIUM CHLORIDE 0.9 % WEIGHT BASED INFUSION: 3.0000 mL/kg/h | INTRAVENOUS | Status: DC

## 2019-12-23 MED ORDER — SODIUM CHLORIDE 0.9% FLUSH: 3.0000 mL | Freq: Two times a day (BID) | INTRAVENOUS | Status: DC

## 2019-12-23 MED ORDER — SODIUM CHLORIDE 0.9 % WEIGHT BASED INFUSION: 1.0000 mL/kg/h | INTRAVENOUS | Status: DC

## 2019-12-23 MED ORDER — SODIUM CHLORIDE 0.9% FLUSH: 3.0000 mL | INTRAVENOUS | Status: DC | PRN

## 2019-12-23 MED ORDER — PHENYLEPHRINE 40 MCG/ML (10ML) SYRINGE FOR IV PUSH (FOR BLOOD PRESSURE SUPPORT)
PREFILLED_SYRINGE | INTRAVENOUS | Status: DC | PRN
  Administered 2019-12-23 (×2): 160 ug via INTRAVENOUS
  Administered 2019-12-23 (×2): 120 ug via INTRAVENOUS

## 2019-12-23 MED ORDER — SODIUM CHLORIDE 0.9 % WEIGHT BASED INFUSION
1.0000 mL/kg/h | INTRAVENOUS | Status: DC
  Administered 2019-12-24: 1 mL/kg/h via INTRAVENOUS

## 2019-12-23 MED ORDER — PANTOPRAZOLE SODIUM 40 MG PO TBEC
40.0000 mg | DELAYED_RELEASE_TABLET | Freq: Every day | ORAL | Status: DC
  Administered 2019-12-23 – 2019-12-24 (×2): 40 mg via ORAL
  Filled 2019-12-23: qty 1

## 2019-12-23 MED ORDER — SODIUM CHLORIDE 0.9 % IV SOLN: INTRAVENOUS | Status: DC

## 2019-12-23 MED ORDER — INSULIN GLARGINE 100 UNIT/ML ~~LOC~~ SOLN
20.0000 [IU] | Freq: Every day | SUBCUTANEOUS | Status: DC
  Administered 2019-12-23: 20 [IU] via SUBCUTANEOUS
  Filled 2019-12-23 (×2): qty 0.2

## 2019-12-23 MED ORDER — ASPIRIN 81 MG PO CHEW
81.0000 mg | CHEWABLE_TABLET | ORAL | Status: AC
  Administered 2019-12-24: 81 mg via ORAL
  Filled 2019-12-23: qty 1

## 2019-12-23 MED ORDER — SODIUM CHLORIDE 0.9 % IV SOLN: 250.0000 mL | INTRAVENOUS | Status: DC | PRN

## 2019-12-23 MED ORDER — PROPOFOL 10 MG/ML IV BOLUS
INTRAVENOUS | Status: DC | PRN
  Administered 2019-12-23: 40 mg via INTRAVENOUS

## 2019-12-23 MED ORDER — SODIUM CHLORIDE 0.9% FLUSH
3.0000 mL | Freq: Two times a day (BID) | INTRAVENOUS | Status: DC
  Administered 2019-12-23: 3 mL via INTRAVENOUS

## 2019-12-23 MED ORDER — CEFDINIR 300 MG PO CAPS
300.0000 mg | ORAL_CAPSULE | Freq: Two times a day (BID) | ORAL | Status: DC
  Administered 2019-12-23 – 2019-12-24 (×2): 300 mg via ORAL
  Filled 2019-12-23 (×5): qty 1

## 2019-12-23 MED ORDER — SODIUM CHLORIDE 0.9 % WEIGHT BASED INFUSION
3.0000 mL/kg/h | INTRAVENOUS | Status: AC
  Administered 2019-12-24: 3 mL/kg/h via INTRAVENOUS

## 2019-12-23 MED ORDER — LACTATED RINGERS IV SOLN: INTRAVENOUS | Status: DC | PRN

## 2019-12-23 MED ORDER — PROPOFOL 500 MG/50ML IV EMUL
INTRAVENOUS | Status: DC | PRN
  Administered 2019-12-23: 150 ug/kg/min via INTRAVENOUS

## 2019-12-23 SURGICAL SUPPLY — 25 items

## 2019-12-23 NOTE — H&P (View-Only) (Signed)
Subjective:  Patient seen and examined at bedside approximately 815AM. Patient denies any chest pain at rest. Patient states that she was unable to ambulate on the floor yesterday night as she was getting prepped for colonoscopy. Nonproductive cough, improved with mild expiratory wheezing has resolved. No events overnight. Telemetry reviewed  Objective:  Vital Signs in the last 24 hours: Temp:  [97.5 F (36.4 C)-98.1 F (36.7 C)] 98.1 F (36.7 C) (03/10 0542) Pulse Rate:  [78-88] 78 (03/10 0542) Resp:  [20] 20 (03/09 1507) BP: (117-148)/(46-77) 117/52 (03/10 0542) SpO2:  [92 %-97 %] 97 % (03/10 0745) Weight:  [82.5 kg] 82.5 kg (03/10 0537)  Intake/Output from previous day: 03/09 0701 - 03/10 0700 In: 346.4 [P.O.:120; I.V.:24.4; IV Piggyback:202] Out: -   Physical Exam  Constitutional: She is oriented to person, place, and time. She appears well-developed.  HENT:  Head: Normocephalic and atraumatic.  Cardiovascular: Normal rate, regular rhythm and normal heart sounds. Exam reveals no gallop and no friction rub.  No murmur heard. Pulmonary/Chest: No respiratory distress. She has wheezes. She has no rales. She exhibits no tenderness.  Abdominal: She exhibits no distension. There is no abdominal tenderness. There is no rebound and no guarding.  Musculoskeletal:        General: Normal range of motion.     Cervical back: Normal range of motion.     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  Neurological: She is alert and oriented to person, place, and time.  Skin: Skin is warm and dry.   Lab Results: BMP Recent Labs    12/20/19 0437 12/21/19 0316 12/22/19 0313  NA 133* 135 137  K 5.7* 4.6 5.1  CL 102 101 105  CO2 23 23 21*  GLUCOSE 196* 208* 274*  BUN 23 32* 36*  CREATININE 1.34* 1.51* 1.49*  CALCIUM 8.8* 8.6* 8.8*  GFRNONAA 41* 35* 36*  GFRAA 47* 41* 41*    CBC Recent Labs  Lab 12/23/19 0915  WBC 5.8  RBC 3.04*  HGB 7.8*  HCT 26.4*  PLT 242  MCV  86.8  MCH 25.7*  MCHC 29.5*  RDW 16.8*  LYMPHSABS 1.3  MONOABS 0.5  EOSABS 0.1  BASOSABS 0.0    HEMOGLOBIN A1C Lab Results  Component Value Date   HGBA1C 8.2 (H) 12/20/2019   MPG 188.64 12/20/2019    Cardiac Panel (last 3 results) No results for input(s): CKTOTAL, CKMB, TROPONINI, RELINDX in the last 8760 hours.  BNP (last 3 results) Recent Labs    12/19/19 1710  BNP 417.0*    TSH No results for input(s): TSH in the last 8760 hours.  Lipid Panel     Component Value Date/Time   CHOL 142 05/30/2009 2042   TRIG 189 (H) 05/30/2009 2042   HDL 36 (L) 05/30/2009 2042   CHOLHDL 3.9 Ratio 05/30/2009 2042   VLDL 38 05/30/2009 2042   LDLCALC 68 05/30/2009 2042     Hepatic Function Panel Recent Labs    12/19/19 1710 12/20/19 0437  PROT 7.5 7.1  ALBUMIN 3.6 3.5  AST 15 15  ALT 13 12  ALKPHOS 61 61  BILITOT 0.5 0.3   Telemetry: Normal sinus rhythm with rare PVCs.  Imaging: DG Chest Port 1 View  Result Date: 12/21/2019 CLINICAL DATA:  Shortness of breath. EXAM: PORTABLE CHEST 1 VIEW COMPARISON:  December 19, 2019. FINDINGS: Stable cardiomediastinal silhouette. Atherosclerosis of thoracic aorta is noted. No pneumothorax or pleural effusion is noted. Mild bibasilar subsegmental atelectasis is noted. Stable  right middle lobe density is noted concerning for pneumonia, atelectasis or mass. Bony thorax is unremarkable. IMPRESSION: Stable right middle lobe density is noted concerning for pneumonia, atelectasis or mass. Continued radiographic follow-up is recommended. Mild bibasilar subsegmental atelectasis is noted. Aortic Atherosclerosis (ICD10-I70.0). Electronically Signed   By: Marijo Conception M.D.   On: 12/21/2019 13:46   ECHOCARDIOGRAM COMPLETE  Result Date: 12/22/2019    ECHOCARDIOGRAM REPORT   Patient Name:   [name] Date of Exam: 12/21/2019 Medical Rec #:  [id number]       Height:       66.0 in Accession #:    [id number]      Weight:       184.8 lb Date of Birth:   Feb 23, 1951       BSA:          1.934 m Patient Age:    68 years        BP:           140/56 mmHg Patient Gender: F               HR:           86 bpm. Exam Location:  Inpatient Procedure: 2D Echo Indications:     chest pain 786.50  History:         Patient has prior history of Echocardiogram examinations, most                  recent 01/01/2013. CAD, Signs/Symptoms:Dyspnea; Risk                  Factors:Diabetes and Dyslipidemia.  Sonographer:     Johny Chess RDCS Referring Phys:  [id number] [facility] PATWARDHAN Diagnosing Phys: Vernell Leep MD IMPRESSIONS  1. Left ventricular ejection fraction, by estimation, is 55 to 60%. The left ventricle has normal function. The left ventricle has no regional wall motion abnormalities. Left ventricular diastolic parameters are consistent with Grade II diastolic dysfunction (pseudonormalization).  2. Right ventricular systolic function is normal. The right ventricular size is normal.  3. Left atrial size was mildly dilated.  4. The mitral valve is grossly normal. Mild to moderate mitral valve regurgitation.  5. Trileaflet aortic valve with mild calcification. Mild aortic stenosis. Mean PG 9 mmHg, Vmax 2.0 m/sec, AVA 2.1 cm2.  6. Estimated RA pressure 8 mmHg. FINDINGS  Left Ventricle: Left ventricular ejection fraction, by estimation, is 55 to 60%. The left ventricle has normal function. The left ventricle has no regional wall motion abnormalities. The left ventricular internal cavity size was normal in size. There is  no left ventricular hypertrophy. Left ventricular diastolic parameters are consistent with Grade II diastolic dysfunction (pseudonormalization). Right Ventricle: The right ventricular size is normal. No increase in right ventricular wall thickness. Right ventricular systolic function is normal. Left Atrium: Left atrial size was mildly dilated. Right Atrium: Right atrial size was normal in size. Pericardium: There is no evidence of pericardial effusion. Mitral  Valve: The mitral valve is grossly normal. Mild to moderate mitral valve regurgitation. Tricuspid Valve: The tricuspid valve is grossly normal. Tricuspid valve regurgitation is not demonstrated. Aortic Valve: The aortic valve is tricuspid. Aortic valve regurgitation is not visualized. Mild aortic stenosis is present. There is mild calcification of the aortic valve. Aortic valve mean gradient measures 9.0 mmHg. Aortic valve peak gradient measures  17.3 mmHg. Aortic valve area, by VTI measures 2.20 cm. Pulmonic Valve: The pulmonic valve was normal in structure. Pulmonic valve regurgitation  is not visualized. Aorta: The aortic root is normal in size and structure. Venous: The inferior vena cava is normal in size with less than 50% respiratory variability, suggesting right atrial pressure of 8 mmHg. IAS/Shunts: The interatrial septum was not assessed.  LEFT VENTRICLE PLAX 2D LVIDd:         5.00 cm  Diastology LVIDs:         3.70 cm  LV e' lateral:   10.40 cm/s LV PW:         1.00 cm  LV E/e' lateral: 12.7 LV IVS:        0.90 cm  LV e' medial:    7.62 cm/s LVOT diam:     2.00 cm  LV E/e' medial:  17.3 LV SV:         86 LV SV Index:   45 LVOT Area:     3.14 cm  RIGHT VENTRICLE RV S prime:     14.10 cm/s TAPSE (M-mode): 2.2 cm LEFT ATRIUM             Index       RIGHT ATRIUM           Index LA diam:        4.20 cm 2.17 cm/m  RA Area:     15.40 cm LA Vol (A2C):   67.2 ml 34.75 ml/m RA Volume:   41.20 ml  21.31 ml/m LA Vol (A4C):   62.6 ml 32.37 ml/m LA Biplane Vol: 66.4 ml 34.34 ml/m  AORTIC VALVE AV Area (Vmax):    2.17 cm AV Area (Vmean):   1.98 cm AV Area (VTI):     2.20 cm AV Vmax:           208.00 cm/s AV Vmean:          143.000 cm/s AV VTI:            0.393 m AV Peak Grad:      17.3 mmHg AV Mean Grad:      9.0 mmHg LVOT Vmax:         144.00 cm/s LVOT Vmean:        90.300 cm/s LVOT VTI:          0.275 m LVOT/AV VTI ratio: 0.70  AORTA Ao Root diam: 3.00 cm MITRAL VALVE MV Area (PHT): 4.21 cm     SHUNTS MV  Decel Time: 180 msec     Systemic VTI:  0.28 m MV E velocity: 132.00 cm/s  Systemic Diam: 2.00 cm MV A velocity: 124.00 cm/s MV E/A ratio:  1.06 Manish Patwardhan MD Electronically signed by Vernell Leep MD Signature Date/Time: 12/22/2019/9:06:18 AM    Final    US Abdomen Limited RUQ  Result Date: 12/21/2019 CLINICAL DATA:  Cirrhosis, search for ascites EXAM: ULTRASOUND ABDOMEN LIMITED RIGHT UPPER QUADRANT COMPARISON:  CT chest 02/18/2020 FINDINGS: Gallbladder: Surgically absent Common bile duct: Diameter: 0.5 cm Liver: No focal lesion identified. Nodular contour with diffuse coarse echotexture. Portal vein is patent on color Doppler imaging with normal direction of blood flow towards the liver. There is trace perihepatic ascites. Other: Small right pleural effusion. IMPRESSION: 1.  Trace perihepatic ascites. 2.  Cirrhotic liver. 3.  Small right pleural effusion. Electronically Signed   By: Audie Pinto M.D.   On: 12/21/2019 12:56   Cardiac Studies: EKG 12/20/2019: Normal sinus rhythm ventricular rate of 70 bpm, possible old anteroseptal infarct, T wave inversions in the high lateral leads suggestive of possible ischemia.  Echocardiogram:  12/21/2019: EF of 54-65%, grade 2 diastolic impairment, mildly dilated left atrium, mild to moderate MR.  Assessment & Recommendations: 69 y.o. Caucasian female  with history of prior myocardial infarction in 1996, coronary artery disease (cath 2010) BMS to LCx, controlled hypertension, type 2 diabetes mellitus, hyperlipidemia, former smoker, hypothyroidism, COPD/asthma, depression, admitted with chest pain, shortness of breath.  Chest pain & effort related dyspnea: Resolved  Currently patient does not have any active chest pain at rest or with ambulation.    Explained to the patient that her rise and fall of high-sensitivity troponins could be secondary to either type I or type II myocardial infarction.  I suspect this most likely secondary to type II  myocardial infarction due to symptomatic anemia and right middle lobe opacity suggestive of community-acquired pneumonia.   Patient is currently being evaluated by GI service.  Recommendations appreciated.  Echocardiogram report reviewed which notes a preserved left ventricular systolic function without any significant valvular heart disease.  Awaiting GI recommendations to see if she is a candidate for therapeutic heparin when undergoing LHC w/ possible intervention.   This was conveyed to the patient who is in agreement with the recommendations provided.  Asymptomatic ventricular runs noted on telemetry: BMP pending we will check magnesium levels as well.  EF is preserved for echocardiogram.  Right middle lobe opacity with an expiratory wheezes: Managed by primary team.   Established coronary disease with prior intervention: See above.   Acute kidney injury most likely secondary to intravascular depletion due to underlying anemia and diuresis: Improving, morning labs pending.  Anemia: H&H trending down, currently followed by GI.   Hypertension with chronic kidney disease stage III: Currently on medications and monitor.   Insulin-dependent diabetes mellitus type 2: Currently managed by primary team.  Mixed hyperlipidemia: Continue statin therapy.  Former smoker: Educated on importance of continued smoking cessation.  Rex Kras, DO, [facility]. [place] Office: [phone]

## 2019-12-23 NOTE — Brief Op Note (Signed)
12/19/2019 - 12/23/2019  2:06 PM  PATIENT:  [name]  69 y.o. female  PRE-OPERATIVE DIAGNOSIS:  anemia, history of adenoma in 2015  POST-OPERATIVE DIAGNOSIS:  EGD: mild gastritis, no evidence of bleeding Colonoscopy: AC polyp x1 hot snare, mucosal trauma clipped ascending colon, transverse colon AVM APC'd  PROCEDURE:  Procedure(s): ESOPHAGOGASTRODUODENOSCOPY (EGD) WITH PROPOFOL (N/A) COLONOSCOPY WITH PROPOFOL (N/A) HEMOSTASIS CLIP PLACEMENT POLYPECTOMY HOT HEMOSTASIS (ARGON PLASMA COAGULATION/BICAP) (N/A)  SURGEON:  Surgeon(s) and Role:    Ronnette Juniper, MD - Primary  PHYSICIAN ASSISTANT:   ASSISTANTS:Jenny Kappus, RN, Lazaro Arms, Tech  ANESTHESIA:   MAC  EBL:  Minimal  BLOOD ADMINISTERED:none  DRAINS: none   LOCAL MEDICATIONS USED:  NONE  SPECIMEN:  Biopsy / Limited Resection  DISPOSITION OF SPECIMEN:  PATHOLOGY  COUNTS:  YES  TOURNIQUET:  * No tourniquets in log *  DICTATION: .Dragon Dictation  PLAN OF CARE: Admit to inpatient   PATIENT DISPOSITION:  PACU - hemodynamically stable.   Delay start of Pharmacological VTE agent (>24hrs) due to surgical blood loss or risk of bleeding: not applicable

## 2019-12-23 NOTE — Anesthesia Postprocedure Evaluation (Signed)
Anesthesia Post Note  Patient: [name]  Procedure(s) Performed: ESOPHAGOGASTRODUODENOSCOPY (EGD) WITH PROPOFOL (N/A ) COLONOSCOPY WITH PROPOFOL (N/A ) HEMOSTASIS CLIP PLACEMENT POLYPECTOMY HOT HEMOSTASIS (ARGON PLASMA COAGULATION/BICAP) (N/A )     Patient location during evaluation: Endoscopy Anesthesia Type: MAC Level of consciousness: awake and alert Pain management: pain level controlled Vital Signs Assessment: post-procedure vital signs reviewed and stable Respiratory status: spontaneous breathing, nonlabored ventilation, respiratory function stable and patient connected to nasal cannula oxygen Cardiovascular status: stable and blood pressure returned to baseline Postop Assessment: no apparent nausea or vomiting Anesthetic complications: no    Last Vitals:  Vitals:   12/23/19 1425 12/23/19 1446  BP: 139/62 (!) 156/75  Pulse: 73 70  Resp: 20   Temp:  36.7 C  SpO2: 99% 98%    Last Pain:  Vitals:   12/23/19 1446  TempSrc: Oral  PainSc:                  Tamekia Rotter,W. EDMOND

## 2019-12-23 NOTE — Op Note (Signed)
[facility] Patient Name: [name] Procedure Date : 12/23/2019 MRN: [id number] Attending MD: Ronnette Juniper , MD Date of Birth: 1951/06/18 CSN: [id number] Age: 69 Admit Type: Inpatient Procedure:                Upper GI endoscopy Indications:              Unexplained iron deficiency anemia Providers:                Ronnette Juniper, MD, Carlyn Reichert, RN, Lazaro Arms,                            Technician Referring MD:             [facility] Medicines:                Monitored Anesthesia Care Complications:            No immediate complications. Estimated Blood Loss:     Estimated blood loss: none. Procedure:                Pre-Anesthesia Assessment:                           - Prior to the procedure, a History and Physical                            was performed, and patient medications and                            allergies were reviewed. The patient's tolerance of                            previous anesthesia was also reviewed. The risks                            and benefits of the procedure and the sedation                            options and risks were discussed with the patient.                            All questions were answered, and informed consent                            was obtained. Prior Anticoagulants: The patient has                            taken no previous anticoagulant or antiplatelet                            agents. ASA Grade Assessment: III - A patient with                            severe systemic disease. After reviewing the risks  and benefits, the patient was deemed in                            satisfactory condition to undergo the procedure.                           After obtaining informed consent, the endoscope was                            passed under direct vision. Throughout the                            procedure, the patient's blood pressure, pulse, and                            oxygen  saturations were monitored continuously. The                            [id number]-H190 ([id number]) Olympus gastroscope was                            introduced through the mouth, and advanced to the                            second part of duodenum. The upper GI endoscopy was                            accomplished without difficulty. The patient                            tolerated the procedure well. Scope In: Scope Out: Findings:      The examined esophagus was normal.      The Z-line was regular and was found 36 cm from the incisors.      Localized mildly erythematous mucosa without bleeding was found in the       gastric antrum.      The cardia and gastric fundus were normal on retroflexion.      The examined duodenum was normal. Impression:               - Normal esophagus.                           - Z-line regular, 36 cm from the incisors.                           - Erythematous mucosa in the antrum.                           - Normal examined duodenum.                           - No specimens collected. Moderate Sedation:      Patient did not receive moderate sedation for this procedure, but       instead received monitored anesthesia care. Recommendation:           -  Perform a colonoscopy today. Procedure Code(s):        --- Professional ---                           [id number], Esophagogastroduodenoscopy, flexible,                            transoral; diagnostic, including collection of                            specimen(s) by brushing or washing, when performed                            (separate procedure) Diagnosis Code(s):        --- Professional ---                           K31.89, Other diseases of stomach and duodenum                           D50.9, Iron deficiency anemia, unspecified CPT copyright 2019 [employer] Association. All rights reserved. The codes documented in this report are preliminary and upon coder review may  be revised to meet current compliance  requirements. Ronnette Juniper, MD 12/23/2019 2:01:19 PM This report has been signed electronically. Number of Addenda: 0

## 2019-12-23 NOTE — Interval H&P Note (Signed)
History and Physical Interval Note: 68/female with anemia, history of colonic adenomas for an EGD and colonoscopy today.  12/23/2019 12:47 PM  [name]  has presented today for EGD and Colonoscopy, with the diagnosis of anemia, history of adenoma in 2015.  The various methods of treatment have been discussed with the patient and family. After consideration of risks, benefits and other options for treatment, the patient has consented to  Procedure(s): ESOPHAGOGASTRODUODENOSCOPY (EGD) WITH PROPOFOL (N/A) COLONOSCOPY WITH PROPOFOL (N/A) as a surgical intervention.  The patient's history has been reviewed, patient examined, no change in status, stable for surgery.  I have reviewed the patient's chart and labs.  Questions were answered to the patient's satisfaction.     Ronnette Juniper

## 2019-12-23 NOTE — Progress Notes (Signed)
PROGRESS NOTE    [name]  LSL:[id number] DOB: 06-Mar-1951 DOA: 12/19/2019 PCP: Lucia Gaskins, MD   Brief Narrative:  Patient is a 69 year old female with history of COPD, diabetes type 2, hyperlipidemia, hypothyroidism, prior history of DVT not on anticoagulation, anxiety, depression who presents at [facility] hospital with complaints of chest pain, shortness of breath for 3 days.  She complained of dyspnea on exertion.  She also complained of left-sided chest pain. In the emergency department, chest x-ray showed opacity in the right midlung field.  Chest CT confirmed pneumonia.  Started on antibiotics.  She was also found to have elevated troponin.  Cardiology was consulted by the emergency physician and transferred to [facility]. Her chest pain has currently resolved.  EKG had some T wave inversion in lead I and aVL.  She had troponin elevation which could be from demand ischemia.  She has known history of coronary artery disease with multiple risk factors. BNP was elevated to 417.  She was given IV Lasix.  Echocardiogram showed ejection fraction of 55 to 11%, grade 2 diastolic dysfunction. Patient was also wheezing on presentation so she has been started on bronchodilators, steroids. Lab work showed normocytic anemia with hemoglobin in the range of 7.  She reported bloody bowel movement several months ago but recently her stools are brown and she has not noticed any change in the color.  GI consulted, plan for EGD/colonoscopy today.  Assessment & Plan:   Active Problems:   Insulin dependent type 2 diabetes mellitus (HCC)   Mixed hyperlipidemia   Coronary artery disease   AKI (acute kidney injury) ([place])   Chest pain   Anemia   Dyspnea   Hx of heart artery stent   Benign hypertension with CKD (chronic kidney disease) stage III   Former smoker   Acute respiratory failure with hypoxia: CT chest done on presentation did not show any pulmonary embolus but showed focal masslike lesion in the  right middle lobe.  Being treated for community-acquired pneumonia.  Hypoxia most likely secondary  to community-acquired pneumonia, congestive heart failure and COPD. Chest x-ray done on 12/21/2019 showed stable right middle lobe density is noted concerning for pneumonia, atelectasis or mass.  Currently on 2 L of oxygen per minute.  She might need oxygen on discharge to home.  Will check if she qualifies for home oxygen.  Chest pain: Elevated troponin on presentation, trended down.  Currently chest pain-free.  EKG showed T wave inversions in lead I, aVL.  Cardiology following.  Possible type II MI due to supply demand ischemia secondary to congestive heart failure.  Further recommendation as per cardiology.  Coronary artery disease: History of coronary artery disease with multiple risk factors.  Currently chest pain-free.  Congestive heart failure: Elevated BNP on presentation.  Given a dose of Lasix IV.  Echocardiogram showed ejection fraction of 55 to 57%, grade 2 diastolic dysfunction.  Currently euvolemic.  Normocytic anemia: Hemoglobin in the range of 7.  Her last hemoglobin in 09/2015 was 11.8.  Patient reported bloody bowel movements few months ago but currently her stool is brown. Iron studies show severe iron deficiency with low iron and ferritin. S/p  a dose of IV iron.  GI consulted, plan for EGD/colonoscopy today  COPD exacerbation: Presented with wheezes.  Started on steroids, bronchodilators.  Currently on supplemental oxygen. Not on oxygen at home.  Start on oral steroids.  Diabetes type 2: Continue current regimen.  Continue to monitor CBGs.  Hemoglobin A1c of 8.2.  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  Creatinine close to baseline.           DVT prophylaxis:Lovenox Code Status: Full Family Communication: Discussed with the patient. Disposition Plan: Patient is from home.  Clinically not ready for discharge because  she is getting  work-up for possible GI bleed, cardiology has not cleared her for discharge.  Expect discharge to home when ready.     Consultants: Cardiology  Procedures:None  Antimicrobials:  Anti-infectives (From admission, onward)   Start     Dose/Rate Route Frequency Ordered Stop   12/22/19 1600  azithromycin (ZITHROMAX) tablet 250 mg     250 mg Oral Daily 12/22/19 1140 12/25/19 0959   12/20/19 2000  azithromycin (ZITHROMAX) 500 mg in sodium chloride 0.9 % 250 mL IVPB  Status:  Discontinued     500 mg 250 mL/hr over 60 Minutes Intravenous Every 24 hours 12/20/19 0315 12/22/19 1140   12/20/19 1800  cefTRIAXone (ROCEPHIN) 1 g in sodium chloride 0.9 % 100 mL IVPB     1 g 200 mL/hr over 30 Minutes Intravenous Every 24 hours 12/20/19 0315     12/19/19 1845  cefTRIAXone (ROCEPHIN) 1 g in sodium chloride 0.9 % 100 mL IVPB     1 g 200 mL/hr over 30 Minutes Intravenous  Once 12/19/19 1840 12/19/19 1932   12/19/19 1845  azithromycin (ZITHROMAX) 500 mg in sodium chloride 0.9 % 250 mL IVPB     500 mg 250 mL/hr over 60 Minutes Intravenous  Once 12/19/19 1840 12/19/19 2325      Subjective: Patient seen and examined the bedside this morning.  Hemodynamically stable.  Comfortable.  Still on 2 L of oxygen per minute.  Denies any shortness of breath or cough.Eager to go home  Objective: Vitals:   12/22/19 2115 12/23/19 0537 12/23/19 0542 12/23/19 0745  BP: (!) 135/46  (!) 117/52   Pulse: 88  78   Resp:      Temp: (!) 97.5 F (36.4 C)  98.1 F (36.7 C)   TempSrc: Oral  Oral   SpO2: 92%  93% 97%  Weight:  82.5 kg    Height:        Intake/Output Summary (Last 24 hours) at 12/23/2019 0759 Last data filed at 12/23/2019 0555 Gross per 24 hour  Intake 324.73 ml  Output --  Net 324.73 ml   Filed Weights   12/21/19 0335 12/22/19 0517 12/23/19 0537  Weight: 83.8 kg 83.8 kg 82.5 kg    Examination:    General exam: Comfortable Respiratory system: Minimal expiratory wheezes Cardiovascular system:  S1 & S2 heard, RRR. No JVD, murmurs, rubs, gallops or clicks. Gastrointestinal system: Abdomen is nondistended, soft and nontender. No organomegaly or masses felt. Normal bowel sounds heard. Central nervous system: Alert and oriented. No focal neurological deficits. Extremities: No edema, no clubbing ,no cyanosis, amputation of right hand Skin: No rashes, lesions or ulcers,no icterus ,no pallor     Data Reviewed: I have personally reviewed following labs and imaging studies  CBC: Recent Labs  Lab 12/19/19 1710 12/20/19 0437 12/21/19 0316 12/22/19 0345  WBC 6.5 6.2 6.5 6.7  NEUTROABS 3.9  --  5.3 5.4  HGB 8.1* 7.7* 7.8* 7.4*  HCT 27.8* 26.1* 26.3* 24.3*  MCV 89.1 89.7 86.5 86.8  PLT 246 231 254 030   Basic Metabolic Panel: Recent Labs  Lab 12/19/19 1710 12/20/19 0437 12/21/19 0316 12/22/19 0313  NA 133* 133* 135 137  K 5.2* 5.7* 4.6 5.1  CL 102 102 101 105  CO2 24 23 23  21*  GLUCOSE 123* 196* 208* 274*  BUN 19 23 32* 36*  CREATININE 1.23* 1.34* 1.51* 1.49*  CALCIUM 9.2 8.8* 8.6* 8.8*  MG  --   --   --  2.1   GFR: Estimated Creatinine Clearance: 39.1 mL/min (A) (by C-G formula based on SCr of 1.49 mg/dL (H)). Liver Function Tests: Recent Labs  Lab 12/19/19 1710 12/20/19 0437  AST 15 15  ALT 13 12  ALKPHOS 61 61  BILITOT 0.5 0.3  PROT 7.5 7.1  ALBUMIN 3.6 3.5   No results for input(s): LIPASE, AMYLASE in the last 168 hours. No results for input(s): AMMONIA in the last 168 hours. Coagulation Profile: Recent Labs  Lab 12/21/19 1119  INR 1.2   Cardiac Enzymes: No results for input(s): CKTOTAL, CKMB, CKMBINDEX, TROPONINI in the last 168 hours. BNP (last 3 results) No results for input(s): PROBNP in the last 8760 hours. HbA1C: Recent Labs    12/20/19 1238  HGBA1C 8.2*   CBG: Recent Labs  Lab 12/20/19 1632 12/20/19 2100 12/22/19 0741 12/22/19 1127 12/22/19 1646  GLUCAP 373* 224* 297* 255* 131*   Lipid Profile: No results for input(s): CHOL,  HDL, LDLCALC, TRIG, CHOLHDL, LDLDIRECT in the last 72 hours. Thyroid Function Tests: No results for input(s): TSH, T4TOTAL, FREET4, T3FREE, THYROIDAB in the last 72 hours. Anemia Panel: Recent Labs    12/21/19 0316  FERRITIN 9*  TIBC 533*  IRON 12*   Sepsis Labs: No results for input(s): PROCALCITON, LATICACIDVEN in the last 168 hours.  Recent Results (from the past 240 hour(s))  Respiratory Panel by RT PCR (Flu A&B, Covid) - Nasopharyngeal Swab     Status: None   Collection Time: 12/20/19 12:30 AM   Specimen: Nasopharyngeal Swab  Result Value Ref Range Status   SARS Coronavirus 2 by RT PCR NEGATIVE NEGATIVE Final    Comment: (NOTE) SARS-CoV-2 target nucleic acids are NOT DETECTED. The SARS-CoV-2 RNA is generally detectable in upper respiratoy specimens during the acute phase of infection. The lowest concentration of SARS-CoV-2 viral copies this assay can detect is 131 copies/mL. A negative result does not preclude SARS-Cov-2 infection and should not be used as the sole basis for treatment or other patient management decisions. A negative result may occur with  improper specimen collection/handling, submission of specimen other than nasopharyngeal swab, presence of viral mutation(s) within the areas targeted by this assay, and inadequate number of viral copies (<131 copies/mL). A negative result must be combined with clinical observations, patient history, and epidemiological information. The expected result is Negative. Fact Sheet for Patients:  PinkCheek.be Fact Sheet for Healthcare Providers:  GravelBags.it This test is not yet ap proved or cleared by the [place] FDA and  has been authorized for detection and/or diagnosis of SARS-CoV-2 by FDA under an Emergency Use Authorization (EUA). This EUA will remain  in effect (meaning this test can be used) for the duration of the COVID-19 declaration under Section  564(b)(1) of the Act, 21 U.S.C. section 360bbb-3(b)(1), unless the authorization is terminated or revoked sooner.    Influenza A by PCR NEGATIVE NEGATIVE Final   Influenza B by PCR NEGATIVE NEGATIVE Final    Comment: (NOTE) The Xpert Xpress SARS-CoV-2/FLU/RSV assay is intended as an aid in  the diagnosis of influenza from Nasopharyngeal swab specimens and  should not be used as a sole basis for treatment. Nasal washings and  aspirates are  unacceptable for Xpert Xpress SARS-CoV-2/FLU/RSV  testing. Fact Sheet for Patients: PinkCheek.be Fact Sheet for Healthcare Providers: GravelBags.it This test is not yet approved or cleared by the [place] FDA and  has been authorized for detection and/or diagnosis of SARS-CoV-2 by  FDA under an Emergency Use Authorization (EUA). This EUA will remain  in effect (meaning this test can be used) for the duration of the  Covid-19 declaration under Section 564(b)(1) of the Act, 21  U.S.C. section 360bbb-3(b)(1), unless the authorization is  terminated or revoked. Performed at [facility], [address] [place], [place] [address]          Radiology Studies: [facility] Chest [facility] 1 View  Result Date: 12/21/2019 CLINICAL DATA:  Shortness of breath. EXAM: PORTABLE CHEST 1 VIEW COMPARISON:  December 19, 2019. FINDINGS: Stable cardiomediastinal silhouette. Atherosclerosis of thoracic aorta is noted. No pneumothorax or pleural effusion is noted. Mild bibasilar subsegmental atelectasis is noted. Stable right middle lobe density is noted concerning for pneumonia, atelectasis or mass. Bony thorax is unremarkable. IMPRESSION: Stable right middle lobe density is noted concerning for pneumonia, atelectasis or mass. Continued radiographic follow-up is recommended. Mild bibasilar subsegmental atelectasis is noted. Aortic Atherosclerosis (ICD10-I70.0). Electronically Signed   By: Marijo Conception M.D.   On: 12/21/2019  13:46   ECHOCARDIOGRAM COMPLETE  Result Date: 12/22/2019    ECHOCARDIOGRAM REPORT   Patient Name:   [name] Date of Exam: 12/21/2019 Medical Rec #:  [id number]       Height:       66.0 in Accession #:    [id number]      Weight:       184.8 lb Date of Birth:  1951-04-02       BSA:          1.934 m Patient Age:    47 years        BP:           140/56 mmHg Patient Gender: F               HR:           86 bpm. Exam Location:  Inpatient Procedure: 2D Echo Indications:     chest pain 786.50  History:         Patient has prior history of Echocardiogram examinations, most                  recent 01/01/2013. CAD, Signs/Symptoms:Dyspnea; Risk                  Factors:Diabetes and Dyslipidemia.  Sonographer:     Johny Chess RDCS Referring Phys:  [id number] [facility] J PATWARDHAN Diagnosing Phys: Vernell Leep MD IMPRESSIONS  1. Left ventricular ejection fraction, by estimation, is 55 to 60%. The left ventricle has normal function. The left ventricle has no regional wall motion abnormalities. Left ventricular diastolic parameters are consistent with Grade II diastolic dysfunction (pseudonormalization).  2. Right ventricular systolic function is normal. The right ventricular size is normal.  3. Left atrial size was mildly dilated.  4. The mitral valve is grossly normal. Mild to moderate mitral valve regurgitation.  5. Trileaflet aortic valve with mild calcification. Mild aortic stenosis. Mean PG 9 mmHg, Vmax 2.0 m/sec, AVA 2.1 cm2.  6. Estimated RA pressure 8 mmHg. FINDINGS  Left Ventricle: Left ventricular ejection fraction, by estimation, is 55 to 60%. The left ventricle has normal function. The left ventricle has no regional wall motion abnormalities. The left ventricular  internal cavity size was normal in size. There is  no left ventricular hypertrophy. Left ventricular diastolic parameters are consistent with Grade II diastolic dysfunction (pseudonormalization). Right Ventricle: The right ventricular size is  normal. No increase in right ventricular wall thickness. Right ventricular systolic function is normal. Left Atrium: Left atrial size was mildly dilated. Right Atrium: Right atrial size was normal in size. Pericardium: There is no evidence of pericardial effusion. Mitral Valve: The mitral valve is grossly normal. Mild to moderate mitral valve regurgitation. Tricuspid Valve: The tricuspid valve is grossly normal. Tricuspid valve regurgitation is not demonstrated. Aortic Valve: The aortic valve is tricuspid. Aortic valve regurgitation is not visualized. Mild aortic stenosis is present. There is mild calcification of the aortic valve. Aortic valve mean gradient measures 9.0 mmHg. Aortic valve peak gradient measures  17.3 mmHg. Aortic valve area, by VTI measures 2.20 cm. Pulmonic Valve: The pulmonic valve was normal in structure. Pulmonic valve regurgitation is not visualized. Aorta: The aortic root is normal in size and structure. Venous: The inferior vena cava is normal in size with less than 50% respiratory variability, suggesting right atrial pressure of 8 mmHg. IAS/Shunts: The interatrial septum was not assessed.  LEFT VENTRICLE PLAX 2D LVIDd:         5.00 cm  Diastology LVIDs:         3.70 cm  LV e' lateral:   10.40 cm/s LV PW:         1.00 cm  LV E/e' lateral: 12.7 LV IVS:        0.90 cm  LV e' medial:    7.62 cm/s LVOT diam:     2.00 cm  LV E/e' medial:  17.3 LV SV:         86 LV SV Index:   45 LVOT Area:     3.14 cm  RIGHT VENTRICLE RV S prime:     14.10 cm/s TAPSE (M-mode): 2.2 cm LEFT ATRIUM             Index       RIGHT ATRIUM           Index LA diam:        4.20 cm 2.17 cm/m  RA Area:     15.40 cm LA Vol (A2C):   67.2 ml 34.75 ml/m RA Volume:   41.20 ml  21.31 ml/m LA Vol (A4C):   62.6 ml 32.37 ml/m LA Biplane Vol: 66.4 ml 34.34 ml/m  AORTIC VALVE AV Area (Vmax):    2.17 cm AV Area (Vmean):   1.98 cm AV Area (VTI):     2.20 cm AV Vmax:           208.00 cm/s AV Vmean:          143.000 cm/s AV VTI:             0.393 m AV Peak Grad:      17.3 mmHg AV Mean Grad:      9.0 mmHg LVOT Vmax:         144.00 cm/s LVOT Vmean:        90.300 cm/s LVOT VTI:          0.275 m LVOT/AV VTI ratio: 0.70  AORTA Ao Root diam: 3.00 cm MITRAL VALVE MV Area (PHT): 4.21 cm     SHUNTS MV Decel Time: 180 msec     Systemic VTI:  0.28 m MV E velocity: 132.00 cm/s  Systemic Diam: 2.00 cm MV A velocity: 124.00  cm/s MV E/A ratio:  1.06 Vernell Leep MD Electronically signed by Vernell Leep MD Signature Date/Time: 12/22/2019/9:06:18 AM    Final    US Abdomen Limited RUQ  Result Date: 12/21/2019 CLINICAL DATA:  Cirrhosis, search for ascites EXAM: ULTRASOUND ABDOMEN LIMITED RIGHT UPPER QUADRANT COMPARISON:  CT chest 02/18/2020 FINDINGS: Gallbladder: Surgically absent Common bile duct: Diameter: 0.5 cm Liver: No focal lesion identified. Nodular contour with diffuse coarse echotexture. Portal vein is patent on color Doppler imaging with normal direction of blood flow towards the liver. There is trace perihepatic ascites. Other: Small right pleural effusion. IMPRESSION: 1.  Trace perihepatic ascites. 2.  Cirrhotic liver. 3.  Small right pleural effusion. Electronically Signed   By: Audie Pinto M.D.   On: 12/21/2019 12:56        Scheduled Meds: . arformoterol  15 mcg Nebulization BID  . aspirin EC  81 mg Oral Daily  . azithromycin  250 mg Oral Daily  . budesonide (PULMICORT) nebulizer solution  0.5 mg Nebulization BID  . cilostazol  100 mg Oral BID AC  . enoxaparin (LOVENOX) injection  40 mg Subcutaneous Q24H  . insulin aspart  0-9 Units Subcutaneous TID WC  . insulin glargine  20 Units Subcutaneous Daily  . ipratropium-albuterol  3 mL Nebulization BID  . levothyroxine  125 mcg Oral Q0600  . LORazepam  1 mg Oral BID  . pantoprazole  40 mg Oral BID  . PARoxetine  20 mg Oral Daily  . polyethylene glycol-electrolytes  4,000 mL Oral Once  . pravastatin  40 mg Oral q1800  . predniSONE  40 mg Oral Q breakfast  .  pregabalin  75 mg Oral Daily  . sodium chloride flush  3 mL Intravenous Q12H  . traZODone  100 mg Oral QHS   Continuous Infusions: . sodium chloride    . sodium chloride 20 mL/hr at 12/23/19 0555  . cefTRIAXone (ROCEPHIN)  IV Stopped (12/22/19 1922)  . nitroGLYCERIN       LOS: 3 days    Time spent: 25 mins.More than 50% of that time was spent in counseling and/or coordination of care.      Shelly Coss, MD [facility] P3/07/2020, 7:59 AM

## 2019-12-23 NOTE — Progress Notes (Addendum)
Patient ambulated in hall independently with steady gait. No complaints of chest pain shortness of breath or felling lightheaded or dizzy. O2 sat ranged from 87-92 %, mainly 89-92% on room air. Patient does have COPD and history of smoking and has had pneumonia this admission. She stated she did not feel like she needed oxygen when ambulating. Will report off to RN to recheck tonight or tomorrow.

## 2019-12-23 NOTE — Progress Notes (Signed)
Subjective:  Patient seen and examined at bedside approximately 815AM. Patient denies any chest pain at rest. Patient states that she was unable to ambulate on the floor yesterday night as she was getting prepped for colonoscopy. Nonproductive cough, improved with mild expiratory wheezing has resolved. No events overnight. Telemetry reviewed  Objective:  Vital Signs in the last 24 hours: Temp:  [97.5 F (36.4 C)-98.1 F (36.7 C)] 98.1 F (36.7 C) (03/10 0542) Pulse Rate:  [78-88] 78 (03/10 0542) Resp:  [20] 20 (03/09 1507) BP: (117-148)/(46-77) 117/52 (03/10 0542) SpO2:  [92 %-97 %] 97 % (03/10 0745) Weight:  [82.5 kg] 82.5 kg (03/10 0537)  Intake/Output from previous day: 03/09 0701 - 03/10 0700 In: 346.4 [P.O.:120; I.V.:24.4; IV Piggyback:202] Out: -   Physical Exam  Constitutional: She is oriented to person, place, and time. She appears well-developed.  HENT:  Head: Normocephalic and atraumatic.  Cardiovascular: Normal rate, regular rhythm and normal heart sounds. Exam reveals no gallop and no friction rub.  No murmur heard. Pulmonary/Chest: No respiratory distress. She has wheezes. She has no rales. She exhibits no tenderness.  Abdominal: She exhibits no distension. There is no abdominal tenderness. There is no rebound and no guarding.  Musculoskeletal:        General: Normal range of motion.     Cervical back: Normal range of motion.     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  Neurological: She is alert and oriented to person, place, and time.  Skin: Skin is warm and dry.   Lab Results: BMP Recent Labs    12/20/19 0437 12/21/19 0316 12/22/19 0313  NA 133* 135 137  K 5.7* 4.6 5.1  CL 102 101 105  CO2 23 23 21*  GLUCOSE 196* 208* 274*  BUN 23 32* 36*  CREATININE 1.34* 1.51* 1.49*  CALCIUM 8.8* 8.6* 8.8*  GFRNONAA 41* 35* 36*  GFRAA 47* 41* 41*    CBC Recent Labs  Lab 12/23/19 0915  WBC 5.8  RBC 3.04*  HGB 7.8*  HCT 26.4*  PLT 242  MCV  86.8  MCH 25.7*  MCHC 29.5*  RDW 16.8*  LYMPHSABS 1.3  MONOABS 0.5  EOSABS 0.1  BASOSABS 0.0    HEMOGLOBIN A1C Lab Results  Component Value Date   HGBA1C 8.2 (H) 12/20/2019   MPG 188.64 12/20/2019    Cardiac Panel (last 3 results) No results for input(s): CKTOTAL, CKMB, TROPONINI, RELINDX in the last 8760 hours.  BNP (last 3 results) Recent Labs    12/19/19 1710  BNP 417.0*    TSH No results for input(s): TSH in the last 8760 hours.  Lipid Panel     Component Value Date/Time   CHOL 142 05/30/2009 2042   TRIG 189 (H) 05/30/2009 2042   HDL 36 (L) 05/30/2009 2042   CHOLHDL 3.9 Ratio 05/30/2009 2042   VLDL 38 05/30/2009 2042   LDLCALC 68 05/30/2009 2042     Hepatic Function Panel Recent Labs    12/19/19 1710 12/20/19 0437  PROT 7.5 7.1  ALBUMIN 3.6 3.5  AST 15 15  ALT 13 12  ALKPHOS 61 61  BILITOT 0.5 0.3   Telemetry: Normal sinus rhythm with rare PVCs.  Imaging: DG Chest Port 1 View  Result Date: 12/21/2019 CLINICAL DATA:  Shortness of breath. EXAM: PORTABLE CHEST 1 VIEW COMPARISON:  December 19, 2019. FINDINGS: Stable cardiomediastinal silhouette. Atherosclerosis of thoracic aorta is noted. No pneumothorax or pleural effusion is noted. Mild bibasilar subsegmental atelectasis is noted. Stable  right middle lobe density is noted concerning for pneumonia, atelectasis or mass. Bony thorax is unremarkable. IMPRESSION: Stable right middle lobe density is noted concerning for pneumonia, atelectasis or mass. Continued radiographic follow-up is recommended. Mild bibasilar subsegmental atelectasis is noted. Aortic Atherosclerosis (ICD10-I70.0). Electronically Signed   By: Marijo Conception M.D.   On: 12/21/2019 13:46   ECHOCARDIOGRAM COMPLETE  Result Date: 12/22/2019    ECHOCARDIOGRAM REPORT   Patient Name:   [name] Date of Exam: 12/21/2019 Medical Rec #:  [id number]       Height:       66.0 in Accession #:    [id number]      Weight:       184.8 lb Date of Birth:   [birth year]       BSA:          1.934 m Patient Age:    19 years        BP:           140/56 mmHg Patient Gender: F               HR:           86 bpm. Exam Location:  Inpatient Procedure: 2D Echo Indications:     chest pain 786.50  History:         Patient has prior history of Echocardiogram examinations, most                  recent 01/01/2013. CAD, Signs/Symptoms:Dyspnea; Risk                  Factors:Diabetes and Dyslipidemia.  Sonographer:     Johny Chess RDCS Referring Phys:  [id number] [facility] J PATWARDHAN Diagnosing Phys: Vernell Leep MD IMPRESSIONS  1. Left ventricular ejection fraction, by estimation, is 55 to 60%. The left ventricle has normal function. The left ventricle has no regional wall motion abnormalities. Left ventricular diastolic parameters are consistent with Grade II diastolic dysfunction (pseudonormalization).  2. Right ventricular systolic function is normal. The right ventricular size is normal.  3. Left atrial size was mildly dilated.  4. The mitral valve is grossly normal. Mild to moderate mitral valve regurgitation.  5. Trileaflet aortic valve with mild calcification. Mild aortic stenosis. Mean PG 9 mmHg, Vmax 2.0 m/sec, AVA 2.1 cm2.  6. Estimated RA pressure 8 mmHg. FINDINGS  Left Ventricle: Left ventricular ejection fraction, by estimation, is 55 to 60%. The left ventricle has normal function. The left ventricle has no regional wall motion abnormalities. The left ventricular internal cavity size was normal in size. There is  no left ventricular hypertrophy. Left ventricular diastolic parameters are consistent with Grade II diastolic dysfunction (pseudonormalization). Right Ventricle: The right ventricular size is normal. No increase in right ventricular wall thickness. Right ventricular systolic function is normal. Left Atrium: Left atrial size was mildly dilated. Right Atrium: Right atrial size was normal in size. Pericardium: There is no evidence of pericardial effusion. Mitral  Valve: The mitral valve is grossly normal. Mild to moderate mitral valve regurgitation. Tricuspid Valve: The tricuspid valve is grossly normal. Tricuspid valve regurgitation is not demonstrated. Aortic Valve: The aortic valve is tricuspid. Aortic valve regurgitation is not visualized. Mild aortic stenosis is present. There is mild calcification of the aortic valve. Aortic valve mean gradient measures 9.0 mmHg. Aortic valve peak gradient measures  17.3 mmHg. Aortic valve area, by VTI measures 2.20 cm. Pulmonic Valve: The pulmonic valve was normal in structure. Pulmonic valve regurgitation  is not visualized. Aorta: The aortic root is normal in size and structure. Venous: The inferior vena cava is normal in size with less than 50% respiratory variability, suggesting right atrial pressure of 8 mmHg. IAS/Shunts: The interatrial septum was not assessed.  LEFT VENTRICLE PLAX 2D LVIDd:         5.00 cm  Diastology LVIDs:         3.70 cm  LV e' lateral:   10.40 cm/s LV PW:         1.00 cm  LV E/e' lateral: 12.7 LV IVS:        0.90 cm  LV e' medial:    7.62 cm/s LVOT diam:     2.00 cm  LV E/e' medial:  17.3 LV SV:         86 LV SV Index:   45 LVOT Area:     3.14 cm  RIGHT VENTRICLE RV S prime:     14.10 cm/s TAPSE (M-mode): 2.2 cm LEFT ATRIUM             Index       RIGHT ATRIUM           Index LA diam:        4.20 cm 2.17 cm/m  RA Area:     15.40 cm LA Vol (A2C):   67.2 ml 34.75 ml/m RA Volume:   41.20 ml  21.31 ml/m LA Vol (A4C):   62.6 ml 32.37 ml/m LA Biplane Vol: 66.4 ml 34.34 ml/m  AORTIC VALVE AV Area (Vmax):    2.17 cm AV Area (Vmean):   1.98 cm AV Area (VTI):     2.20 cm AV Vmax:           208.00 cm/s AV Vmean:          143.000 cm/s AV VTI:            0.393 m AV Peak Grad:      17.3 mmHg AV Mean Grad:      9.0 mmHg LVOT Vmax:         144.00 cm/s LVOT Vmean:        90.300 cm/s LVOT VTI:          0.275 m LVOT/AV VTI ratio: 0.70  AORTA Ao Root diam: 3.00 cm MITRAL VALVE MV Area (PHT): 4.21 cm     SHUNTS MV  Decel Time: 180 msec     Systemic VTI:  0.28 m MV E velocity: 132.00 cm/s  Systemic Diam: 2.00 cm MV A velocity: 124.00 cm/s MV E/A ratio:  1.06 Manish Patwardhan MD Electronically signed by Vernell Leep MD Signature Date/Time: 12/22/2019/9:06:18 AM    Final    US Abdomen Limited RUQ  Result Date: 12/21/2019 CLINICAL DATA:  Cirrhosis, search for ascites EXAM: ULTRASOUND ABDOMEN LIMITED RIGHT UPPER QUADRANT COMPARISON:  CT chest 02/18/2020 FINDINGS: Gallbladder: Surgically absent Common bile duct: Diameter: 0.5 cm Liver: No focal lesion identified. Nodular contour with diffuse coarse echotexture. Portal vein is patent on color Doppler imaging with normal direction of blood flow towards the liver. There is trace perihepatic ascites. Other: Small right pleural effusion. IMPRESSION: 1.  Trace perihepatic ascites. 2.  Cirrhotic liver. 3.  Small right pleural effusion. Electronically Signed   By: Audie Pinto M.D.   On: 12/21/2019 12:56   Cardiac Studies: EKG 12/20/2019: Normal sinus rhythm ventricular rate of 70 bpm, possible old anteroseptal infarct, T wave inversions in the high lateral leads suggestive of possible ischemia.  Echocardiogram:  12/21/2019: EF of 21-30%, grade 2 diastolic impairment, mildly dilated left atrium, mild to moderate MR.  Assessment & Recommendations: 69 y.o. Caucasian female  with history of prior myocardial infarction in 1996, coronary artery disease (cath 2010) BMS to LCx, controlled hypertension, type 2 diabetes mellitus, hyperlipidemia, former smoker, hypothyroidism, COPD/asthma, depression, admitted with chest pain, shortness of breath.  Chest pain & effort related dyspnea: Resolved  Currently patient does not have any active chest pain at rest or with ambulation.    Explained to the patient that her rise and fall of high-sensitivity troponins could be secondary to either type I or type II myocardial infarction.  I suspect this most likely secondary to type II  myocardial infarction due to symptomatic anemia and right middle lobe opacity suggestive of community-acquired pneumonia.   Patient is currently being evaluated by GI service.  Recommendations appreciated.  Echocardiogram report reviewed which notes a preserved left ventricular systolic function without any significant valvular heart disease.  Awaiting GI recommendations to see if she is a candidate for therapeutic heparin when undergoing LHC w/ possible intervention.   This was conveyed to the patient who is in agreement with the recommendations provided.  Asymptomatic ventricular runs noted on telemetry: BMP pending we will check magnesium levels as well.  EF is preserved for echocardiogram.  Right middle lobe opacity with an expiratory wheezes: Managed by primary team.   Established coronary disease with prior intervention: See above.   Acute kidney injury most likely secondary to intravascular depletion due to underlying anemia and diuresis: Improving, morning labs pending.  Anemia: H&H trending down, currently followed by GI.   Hypertension with chronic kidney disease stage III: Currently on medications and monitor.   Insulin-dependent diabetes mellitus type 2: Currently managed by primary team.  Mixed hyperlipidemia: Continue statin therapy.  Former smoker: Educated on importance of continued smoking cessation.  Rex Kras, DO, [facility]. [place] Office: [phone]

## 2019-12-23 NOTE — Transfer of Care (Signed)
Immediate Anesthesia Transfer of Care Note  Patient: [name]  Procedure(s) Performed: ESOPHAGOGASTRODUODENOSCOPY (EGD) WITH PROPOFOL (N/A ) COLONOSCOPY WITH PROPOFOL (N/A ) HEMOSTASIS CLIP PLACEMENT POLYPECTOMY HOT HEMOSTASIS (ARGON PLASMA COAGULATION/BICAP) (N/A )  Patient Location: Endoscopy Unit  Anesthesia Type:MAC  Level of Consciousness: awake, alert  and oriented  Airway & Oxygen Therapy: Patient Spontanous Breathing and Patient connected to nasal cannula oxygen  Post-op Assessment: Report given to RN, Post -op Vital signs reviewed and stable and Patient moving all extremities  Post vital signs: Reviewed and stable  Last Vitals:  Vitals Value Taken Time  BP 98/38 12/23/19 1408  Temp    Pulse 75 12/23/19 1411  Resp 17 12/23/19 1411  SpO2 99 % 12/23/19 1411  Vitals shown include unvalidated device data.  Last Pain:  Vitals:   12/23/19 1406  TempSrc:   PainSc: 0-No pain      Patients Stated Pain Goal: 0 (11/57/26 2035)  Complications: No apparent anesthesia complications

## 2019-12-23 NOTE — Anesthesia Preprocedure Evaluation (Addendum)
Anesthesia Evaluation  Patient identified by MRN, date of birth, ID band Patient awake    Reviewed: Allergy & Precautions, H&P , NPO status , Patient's Chart, lab work & pertinent test results  Airway Mallampati: III  TM Distance: >3 FB Neck ROM: Full    Dental no notable dental hx. (+) Edentulous Upper, Edentulous Lower, Dental Advisory Given   Pulmonary COPD,  COPD inhaler, former smoker,    Pulmonary exam normal breath sounds clear to auscultation       Cardiovascular hypertension, Pt. on medications + CAD and + Cardiac Stents   Rhythm:Regular Rate:Normal     Neuro/Psych Anxiety Depression negative neurological ROS     GI/Hepatic Neg liver ROS, GERD  Medicated and Controlled,  Endo/Other  diabetes, Insulin Dependent, Oral Hypoglycemic AgentsHypothyroidism   Renal/GU Renal InsufficiencyRenal disease  negative genitourinary   Musculoskeletal  (+) Arthritis , Osteoarthritis,    Abdominal   Peds  Hematology  (+) Blood dyscrasia, anemia ,   Anesthesia Other Findings   Reproductive/Obstetrics negative OB ROS                            Anesthesia Physical Anesthesia Plan  ASA: III  Anesthesia Plan: MAC   Post-op Pain Management:    Induction: Intravenous  PONV Risk Score and Plan: 2 and Propofol infusion and Treatment may vary due to age or medical condition  Airway Management Planned: Nasal Cannula  Additional Equipment:   Intra-op Plan:   Post-operative Plan:   Informed Consent: I have reviewed the patients History and Physical, chart, labs and discussed the procedure including the risks, benefits and alternatives for the proposed anesthesia with the patient or authorized representative who has indicated his/her understanding and acceptance.     Dental advisory given  Plan Discussed with: CRNA  Anesthesia Plan Comments:         Anesthesia Quick Evaluation

## 2019-12-23 NOTE — Anesthesia Procedure Notes (Signed)
Procedure Name: MAC Date/Time: 12/23/2019 1:15 PM Performed by: Leonor Liv, CRNA Pre-anesthesia Checklist: Patient identified, Emergency Drugs available, Suction available, Patient being monitored and Timeout performed Patient Re-evaluated:Patient Re-evaluated prior to induction Oxygen Delivery Method: Nasal cannula Airway Equipment and Method: Bite block Placement Confirmation: positive ETCO2 Dental Injury: Teeth and Oropharynx as per pre-operative assessment

## 2019-12-23 NOTE — Op Note (Signed)
[facility] Patient Name: [name] Procedure Date : 12/23/2019 MRN: [id number] Attending MD: Ronnette Juniper , MD Date of Birth: 1951/06/08 CSN: [id number] Age: 69 Admit Type: Inpatient Procedure:                Colonoscopy Indications:              Last colonoscopy: 2015, Unexplained iron deficiency                            anemia, Follow-up for history of adenomatous polyps                            in the colon Providers:                Ronnette Juniper, MD, Carlyn Reichert, RN, Lazaro Arms,                            Technician Referring MD:             [facility] Medicines:                Monitored Anesthesia Care Complications:            No immediate complications. Estimated blood loss:                            Minimal. Estimated Blood Loss:     Estimated blood loss was minimal. Procedure:                Pre-Anesthesia Assessment:                           - Prior to the procedure, a History and Physical                            was performed, and patient medications and                            allergies were reviewed. The patient's tolerance of                            previous anesthesia was also reviewed. The risks                            and benefits of the procedure and the sedation                            options and risks were discussed with the patient.                            All questions were answered, and informed consent                            was obtained. Prior Anticoagulants: The patient has                            taken no previous anticoagulant  or antiplatelet                            agents. ASA Grade Assessment: III - A patient with                            severe systemic disease. After reviewing the risks                            and benefits, the patient was deemed in                            satisfactory condition to undergo the procedure.                           - Prior to the procedure, a History and  Physical                            was performed, and patient medications and                            allergies were reviewed. The patient's tolerance of                            previous anesthesia was also reviewed. The risks                            and benefits of the procedure and the sedation                            options and risks were discussed with the patient.                            All questions were answered, and informed consent                            was obtained. Prior Anticoagulants: The patient has                            taken no previous anticoagulant or antiplatelet                            agents. ASA Grade Assessment: III - A patient with                            severe systemic disease. After reviewing the risks                            and benefits, the patient was deemed in                            satisfactory condition to undergo the procedure.  After obtaining informed consent, the colonoscope                            was passed under direct vision. Throughout the                            procedure, the patient's blood pressure, pulse, and                            oxygen saturations were monitored continuously. The                            [id number]-[id number] Olympus pediatric colonscope                            was introduced through the anus and advanced to the                            the cecum, identified by appendiceal orifice and                            ileocecal valve. The colonoscopy was technically                            difficult and complex due to a redundant colon,                            significant looping and a tortuous colon.                            Successful completion of the procedure was aided by                            applying abdominal pressure. The patient tolerated                            the procedure well. The quality of the bowel                             preparation was adequate to identify polyps 6 mm                            and larger in size. Scope In: 1:30:53 PM Scope Out: 1:57:37 PM Scope Withdrawal Time: 0 hours 14 minutes 7 seconds  Total Procedure Duration: 0 hours 26 minutes 44 seconds  Findings:      A 7 mm polyp was found in the ascending colon. The polyp was sessile.       The polyp was removed with a hot snare. Resection and retrieval were       complete.      A single small localized angioectasia without bleeding was found in the       transverse colon. Coagulation for tissue destruction using argon plasma       at 1 liter/minute and 20 watts was successful.  A localized area of mildly friable (with contact bleeding) mucosa was       found in the transverse colon. To prevent bleeding post-intervention,       one hemostatic clip was successfully placed (MR conditional). There was       no bleeding at the end of the procedure.      The exam was otherwise without abnormality on direct and retroflexion       views. Impression:               - One 7 mm polyp in the ascending colon, removed                            with a hot snare. Resected and retrieved.                           - A single non-bleeding colonic angioectasia.                            Treated with argon plasma coagulation (APC).                           Dionisio David (with contact bleeding) mucosa in the                            transverse colon. Clip (MR conditional) was placed.                           - The examination was otherwise normal on direct                            and retroflexion views. Moderate Sedation:      Patient did not receive moderate sedation for this procedure, but       instead received monitored anesthesia care. Recommendation:           - Cardiac diet.                           - Continue present medications.                           - Await pathology results.                           - Repeat colonoscopy for  surveillance based on                            pathology results. Procedure Code(s):        --- Professional ---                           [phone], Colonoscopy, flexible; with ablation of                            tumor(s), polyp(s), or other lesion(s) (includes  pre- and post-dilation and guide wire passage, when                            performed)                           [id number], 59, Colonoscopy, flexible; with removal of                            tumor(s), polyp(s), or other lesion(s) by snare                            technique Diagnosis Code(s):        --- Professional ---                           K63.5, Polyp of colon                           K55.20, Angiodysplasia of colon without hemorrhage                           K92.2, Gastrointestinal hemorrhage, unspecified                           D50.9, Iron deficiency anemia, unspecified                           Z86.010, Personal history of colonic polyps CPT copyright 2019 [employer]. All rights reserved. The codes documented in this report are preliminary and upon coder review may  be revised to meet current compliance requirements. Ronnette Juniper, MD 12/23/2019 2:06:12 PM This report has been signed electronically. Number of Addenda: 0

## 2019-12-24 ENCOUNTER — Telehealth: Payer: Self-pay

## 2019-12-24 ENCOUNTER — Other Ambulatory Visit: Payer: Self-pay | Admitting: Cardiology

## 2019-12-24 ENCOUNTER — Encounter: Payer: Self-pay | Admitting: *Deleted

## 2019-12-24 ENCOUNTER — Inpatient Hospital Stay (HOSPITAL_COMMUNITY)
Admission: EM | Disposition: A | Discharge status: Home / Self Care | Payer: Self-pay | Source: Home / Self Care | Attending: Internal Medicine

## 2019-12-24 DIAGNOSIS — I214 Non-ST elevation (NSTEMI) myocardial infarction: Secondary | ICD-10-CM

## 2019-12-24 DIAGNOSIS — D649 Anemia, unspecified: Secondary | ICD-10-CM

## 2019-12-24 DIAGNOSIS — R072 Precordial pain: Secondary | ICD-10-CM

## 2019-12-24 DIAGNOSIS — I25110 Atherosclerotic heart disease of native coronary artery with unstable angina pectoris: Secondary | ICD-10-CM

## 2019-12-24 HISTORY — PX: LEFT HEART CATH AND CORONARY ANGIOGRAPHY: CATH118249

## 2019-12-24 LAB — CBC WITH DIFFERENTIAL/PLATELET
Abs Immature Granulocytes: 0.09 10*3/uL — ABNORMAL HIGH (ref 0.00–0.07)
Basophils Absolute: 0 10*3/uL (ref 0.0–0.1)
Basophils Relative: 0 %
Eosinophils Absolute: 0 10*3/uL (ref 0.0–0.5)
Eosinophils Relative: 0 %
HCT: 27.1 % — ABNORMAL LOW (ref 36.0–46.0)
Hemoglobin: 7.9 g/dL — ABNORMAL LOW (ref 12.0–15.0)
Immature Granulocytes: 2 %
Lymphocytes Relative: 10 %
Lymphs Abs: 0.6 10*3/uL — ABNORMAL LOW (ref 0.7–4.0)
MCH: 25.6 pg — ABNORMAL LOW (ref 26.0–34.0)
MCHC: 29.2 g/dL — ABNORMAL LOW (ref 30.0–36.0)
MCV: 88 fL (ref 80.0–100.0)
Monocytes Absolute: 0.3 10*3/uL (ref 0.1–1.0)
Monocytes Relative: 5 %
Neutro Abs: 4.7 10*3/uL (ref 1.7–7.7)
Neutrophils Relative %: 83 %
Platelets: 233 10*3/uL (ref 150–400)
RBC: 3.08 MIL/uL — ABNORMAL LOW (ref 3.87–5.11)
RDW: 16.7 % — ABNORMAL HIGH (ref 11.5–15.5)
WBC: 5.7 10*3/uL (ref 4.0–10.5)
nRBC: 0.4 % — ABNORMAL HIGH (ref 0.0–0.2)

## 2019-12-24 LAB — ABO/RH: ABO/RH(D): A NEG

## 2019-12-24 LAB — PREPARE RBC (CROSSMATCH)

## 2019-12-24 LAB — GLUCOSE, CAPILLARY: Glucose-Capillary: 181 mg/dL — ABNORMAL HIGH (ref 70–99)

## 2019-12-24 SURGERY — LEFT HEART CATH AND CORONARY ANGIOGRAPHY
Anesthesia: LOCAL

## 2019-12-24 MED ORDER — VERAPAMIL HCL 2.5 MG/ML IV SOLN
INTRAVENOUS | Status: AC
  Filled 2019-12-24: qty 2

## 2019-12-24 MED ORDER — HYDRALAZINE HCL 20 MG/ML IJ SOLN
10.0000 mg | INTRAMUSCULAR | Status: AC | PRN
  Administered 2019-12-24: 10 mg via INTRAVENOUS

## 2019-12-24 MED ORDER — FENTANYL CITRATE (PF) 100 MCG/2ML IJ SOLN
INTRAMUSCULAR | Status: AC
  Filled 2019-12-24: qty 2

## 2019-12-24 MED ORDER — HYDRALAZINE HCL 20 MG/ML IJ SOLN
INTRAMUSCULAR | Status: AC
  Filled 2019-12-24: qty 1

## 2019-12-24 MED ORDER — CEFDINIR 300 MG PO CAPS: 300.0000 mg | ORAL_CAPSULE | Freq: Two times a day (BID) | ORAL | 0 refills | Status: AC

## 2019-12-24 MED ORDER — LABETALOL HCL 5 MG/ML IV SOLN: 10.0000 mg | INTRAVENOUS | Status: AC | PRN

## 2019-12-24 MED ORDER — HEPARIN SODIUM (PORCINE) 1000 UNIT/ML IJ SOLN
INTRAMUSCULAR | Status: AC
  Filled 2019-12-24: qty 1

## 2019-12-24 MED ORDER — ACETAMINOPHEN 325 MG PO TABS: 650.0000 mg | ORAL_TABLET | ORAL | Status: DC | PRN

## 2019-12-24 MED ORDER — MIDAZOLAM HCL 2 MG/2ML IJ SOLN
INTRAMUSCULAR | Status: DC | PRN
  Administered 2019-12-24: 2 mg via INTRAVENOUS
  Administered 2019-12-24: 1 mg via INTRAVENOUS

## 2019-12-24 MED ORDER — HEPARIN (PORCINE) IN NACL 1000-0.9 UT/500ML-% IV SOLN
INTRAVENOUS | Status: DC | PRN
  Administered 2019-12-24 (×3): 500 mL

## 2019-12-24 MED ORDER — MIDAZOLAM HCL 2 MG/2ML IJ SOLN
INTRAMUSCULAR | Status: AC
  Filled 2019-12-24: qty 2

## 2019-12-24 MED ORDER — PREDNISONE 20 MG PO TABS: 40.0000 mg | ORAL_TABLET | Freq: Every day | ORAL | 0 refills | Status: AC

## 2019-12-24 MED ORDER — LIDOCAINE HCL (PF) 1 % IJ SOLN
INTRAMUSCULAR | Status: AC
  Filled 2019-12-24: qty 30

## 2019-12-24 MED ORDER — FUROSEMIDE 40 MG PO TABS: 40.0000 mg | ORAL_TABLET | Freq: Two times a day (BID) | ORAL | Status: DC

## 2019-12-24 MED ORDER — FUROSEMIDE 10 MG/ML IJ SOLN
40.0000 mg | Freq: Once | INTRAMUSCULAR | Status: AC
  Administered 2019-12-24: 40 mg via INTRAVENOUS
  Filled 2019-12-24: qty 4

## 2019-12-24 MED ORDER — FENTANYL CITRATE (PF) 100 MCG/2ML IJ SOLN
INTRAMUSCULAR | Status: DC | PRN
  Administered 2019-12-24: 25 ug via INTRAVENOUS
  Administered 2019-12-24: 50 ug via INTRAVENOUS

## 2019-12-24 MED ORDER — FERROUS SULFATE 325 (65 FE) MG PO TABS: 325.0000 mg | ORAL_TABLET | Freq: Every day | ORAL | 1 refills | Status: DC

## 2019-12-24 MED ORDER — POTASSIUM CHLORIDE ER 20 MEQ PO TBCR: 40.0000 meq | EXTENDED_RELEASE_TABLET | Freq: Every day | ORAL | 1 refills | Status: DC

## 2019-12-24 MED ORDER — DIPHENHYDRAMINE HCL 50 MG/ML IJ SOLN
25.0000 mg | Freq: Once | INTRAMUSCULAR | Status: AC
  Administered 2019-12-24: 25 mg via INTRAVENOUS
  Filled 2019-12-24: qty 1

## 2019-12-24 MED ORDER — ONDANSETRON HCL 4 MG/2ML IJ SOLN: 4.0000 mg | Freq: Four times a day (QID) | INTRAMUSCULAR | Status: DC | PRN

## 2019-12-24 MED ORDER — CLOPIDOGREL BISULFATE 75 MG PO TABS: 75.0000 mg | ORAL_TABLET | Freq: Every day | ORAL | 1 refills | Status: DC

## 2019-12-24 MED ORDER — FUROSEMIDE 40 MG PO TABS: 40.0000 mg | ORAL_TABLET | Freq: Two times a day (BID) | ORAL | 1 refills | Status: DC

## 2019-12-24 MED ORDER — SODIUM CHLORIDE 0.9 % IV SOLN: 250.0000 mL | INTRAVENOUS | Status: DC | PRN

## 2019-12-24 MED ORDER — SODIUM CHLORIDE 0.9 % IV SOLN: INTRAVENOUS | Status: AC

## 2019-12-24 MED ORDER — SODIUM CHLORIDE 0.9% FLUSH: 3.0000 mL | INTRAVENOUS | Status: DC | PRN

## 2019-12-24 MED ORDER — METHYLPREDNISOLONE SODIUM SUCC 125 MG IJ SOLR
125.0000 mg | Freq: Once | INTRAMUSCULAR | Status: AC
  Administered 2019-12-24: 125 mg via INTRAVENOUS
  Filled 2019-12-24: qty 2

## 2019-12-24 MED ORDER — CLOPIDOGREL BISULFATE 75 MG PO TABS: 75.0000 mg | ORAL_TABLET | Freq: Every day | ORAL | Status: DC

## 2019-12-24 MED ORDER — FUROSEMIDE 40 MG PO TABS: 40.0000 mg | ORAL_TABLET | Freq: Every day | ORAL | Status: DC

## 2019-12-24 MED ORDER — SODIUM CHLORIDE 0.9% IV SOLUTION: Freq: Once | INTRAVENOUS | Status: DC

## 2019-12-24 MED ORDER — LIDOCAINE HCL (PF) 1 % IJ SOLN
INTRAMUSCULAR | Status: DC | PRN
  Administered 2019-12-24: 2 mL via INTRADERMAL
  Administered 2019-12-24: 30 mL via INTRADERMAL

## 2019-12-24 MED ORDER — VERAPAMIL HCL 2.5 MG/ML IV SOLN: INTRAVENOUS | Status: DC | PRN

## 2019-12-24 MED ORDER — HEPARIN (PORCINE) IN NACL 1000-0.9 UT/500ML-% IV SOLN
INTRAVENOUS | Status: AC
  Filled 2019-12-24: qty 1000

## 2019-12-24 MED ORDER — IOHEXOL 350 MG/ML SOLN
INTRAVENOUS | Status: DC | PRN
  Administered 2019-12-24: 30 mL via INTRA_ARTERIAL

## 2019-12-24 MED ORDER — SODIUM CHLORIDE 0.9% FLUSH: 3.0000 mL | Freq: Two times a day (BID) | INTRAVENOUS | Status: DC

## 2019-12-24 SURGICAL SUPPLY — 13 items
CATH DXT MULTI JL4 JR4 ANG PIG (CATHETERS) ×1 IMPLANT
CATH OPTITORQUE TIG 4.0 5F (CATHETERS) ×1 IMPLANT
GLIDESHEATH SLEND A-KIT 6F 22G (SHEATH) ×1 IMPLANT
GUIDEWIRE INQWIRE 1.5J.035X260 (WIRE) IMPLANT
INQWIRE 1.5J .035X260CM (WIRE) ×2
KIT HEART LEFT (KITS) ×2 IMPLANT
KIT MICROPUNCTURE NIT STIFF (SHEATH) ×1 IMPLANT
PACK CARDIAC CATHETERIZATION (CUSTOM PROCEDURE TRAY) ×2 IMPLANT
SHEATH PINNACLE 5F 10CM (SHEATH) ×1 IMPLANT
SHEATH PROBE COVER 6X72 (BAG) ×1 IMPLANT
SYR MEDRAD MARK 7 150ML (SYRINGE) ×2 IMPLANT
TRANSDUCER W/STOPCOCK (MISCELLANEOUS) ×2 IMPLANT
TUBING CIL FLEX 10 FLL-RA (TUBING) ×2 IMPLANT

## 2019-12-24 NOTE — Discharge Summary (Addendum)
Physician Discharge Summary  [name] XTK:[id number] DOB: 1951-05-11 DOA: 12/19/2019  PCP: Lucia Gaskins, MD  Admit date: 12/19/2019 Discharge date: 12/24/2019  Admitted From: Home Disposition:  Home  Discharge Condition:Stable CODE STATUS:FULL, Diet recommendation: Heart Healthy  Brief/Interim Summary: Patient is a 69 year old female with history of COPD, diabetes type 2, hyperlipidemia, hypothyroidism, prior history of DVT not on anticoagulation, anxiety, depression who presents at [facility] hospital with complaints of chest pain, shortness of breath for 3 days. She complained of dyspnea on exertion. She also complained of left-sided chest pain. In the emergency department, chest x-ray showed opacity in the right midlung field. Chest CT confirmed pneumonia. Started on antibiotics. She was also found to have elevated troponin. Cardiology was consulted by the emergency physician and transferred to [facility]. Her chest pain has currently resolved. EKG had some T wave inversion in lead I and aVL. She had troponin elevation which could be from demand ischemia. She has known history of coronary artery disease with multiple risk factors. BNP was elevated to 417. She was given IV Lasix. Echocardiogram showed ejection fraction of 55 to 99%, grade 2 diastolic dysfunction. Patient was also wheezing on presentation so she has been started on bronchodilators, steroids. Lab work showed normocytic anemia with hemoglobin in the range of 7.She reported bloody bowel movement several months ago but recently her stools are brown and she has not noticed any change in the color.  GI consulted, she underwent  EGD/colonoscopy on 12/23/19.  She also underwent cardiac catheterization on 12/24/2019. She is hemodynamically stable for discharge home today.  Following problems were addressed during her  Hospitalization:  Acute respiratory failure with hypoxia: CT chest done on presentation did not show any  pulmonary embolus but showed focal masslike lesion in the right middle lobe.  Being treated for community-acquired pneumonia.  Hypoxia most likely secondary  to community-acquired pneumonia, congestive heart failure and COPD. Chest x-ray done on 12/21/2019 showed stable right middle lobe density is noted concerning for pneumonia, atelectasis or mass.  Currently saturating fine on room air.  Chest pain/coronary artery disease: Elevated troponin on presentation, trended down.  Currently chest pain-free.  EKG showed T wave inversions in lead I, aVL.  Cardiology following.  Possible type II MI due to supply demand ischemia secondary to congestive heart failure.  She underwent cardiac cath on 12/24/2019.  She was found to have proximal RCA 95% calcific stenosis.  Cardiology planning for intervention to her proximal RCA as an outpatient. Start on Plavix.  Congestive heart failure: Elevated BNP on presentation. Echocardiogram showed ejection fraction of 55 to 24%, grade 2 diastolic dysfunction.  Started on Lasix 40 mg twice a day as per cardiology.  Normocytic anemia: Hemoglobin in the range of 7.  Her last hemoglobin in 09/2015 was 11.8.  Patient reported bloody bowel movements few months ago but currently her stool is brown. Iron studies show severe iron deficiency with low iron and ferritin. S/p  a dose of IV iron.  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  APC applied. She has been transfused with a unit of PRBC here.  Follow-up CBC in a week.  COPD exacerbation: Presented with wheezes.  Started on steroids, bronchodilators.  Not on oxygen at home.  Start on oral steroids.  Diabetes type 2: Continue home regimen.  Continue to monitor CBGs.  Hemoglobin A1c of 8.2.  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  Creatinine  close to baseline.  Discharge Diagnoses:  Active Problems:   Insulin dependent  type 2 diabetes mellitus (HCC)   Mixed hyperlipidemia   Coronary artery disease   AKI (acute kidney injury) ([place])   Chest pain   Anemia   Dyspnea   Hx of heart artery stent   Benign hypertension with CKD (chronic kidney disease) stage III   Former smoker    Discharge Instructions  Discharge Instructions    Diet - low sodium heart healthy   Complete by: As directed    Discharge instructions   Complete by: As directed    1)Please follow-up with your PCP in a week.  Do a CBC,BMP tests during the follow-up 2)Follow up with cardiology as an outpatient on the given appointment date. 3)Take prescribed medications as instructed.   Increase activity slowly   Complete by: As directed      Allergies as of 12/24/2019      Reactions   Iohexol     Desc: CHEST TIGHTNESS,BRETHING PROBLEMS NEEDS PRE MEDS   Tape Other (See Comments)   Tears skin   Ciprofloxacin Rash   Latex Rash   GLOVES   Penicillins Rash   Povidone-iodine Rash      Medication List    STOP taking these medications   cilostazol 100 MG tablet Commonly known as: PLETAL     TAKE these medications   albuterol 108 (90 Base) MCG/ACT inhaler Commonly known as: VENTOLIN HFA Inhale 2 puffs into the lungs every 4 (four) hours as needed for wheezing or shortness of breath.   cefdinir 300 MG capsule Commonly known as: OMNICEF Take 1 capsule (300 mg total) by mouth every 12 (twelve) hours for 3 days.   clopidogrel 75 MG tablet Commonly known as: PLAVIX Take 1 tablet (75 mg total) by mouth daily. Start taking on: December 25, 2019   ferrous sulfate 325 (65 FE) MG tablet Take 1 tablet (325 mg total) by mouth daily with breakfast.   furosemide 40 MG tablet Commonly known as: LASIX Take 1 tablet (40 mg total) by mouth 2 (two) times daily. Start taking on: December 25, 2019   Lantus SoloStar 100 UNIT/ML Solostar Pen Generic drug: insulin glargine Inject 40 Units into the skin daily.   levothyroxine 125 MCG  tablet Commonly known as: SYNTHROID Take 125 mcg by mouth daily.   lisinopril 20 MG tablet Commonly known as: ZESTRIL Take 20 mg by mouth daily.   LORazepam 1 MG tablet Commonly known as: ATIVAN Take 1 mg by mouth 2 (two) times daily.   metFORMIN 500 MG tablet Commonly known as: GLUCOPHAGE Take 500 mg by mouth 2 (two) times daily with a meal.   oxyCODONE-acetaminophen 5-325 MG tablet Commonly known as: PERCOCET/ROXICET Take 1-2 tablets by mouth every 4 (four) hours as needed for severe pain.   pantoprazole 40 MG tablet Commonly known as: PROTONIX Take 40 mg by mouth daily.   PARoxetine 20 MG tablet Commonly known as: PAXIL Take 20 mg by mouth daily.   Potassium Chloride ER 20 MEQ Tbcr Take 40 mEq by mouth daily.   pravastatin 40 MG tablet Commonly known as: PRAVACHOL Take 40 mg by mouth daily.   predniSONE 20 MG tablet Commonly known as: DELTASONE Take 2 tablets (40 mg total) by mouth daily with breakfast for 3 days. Start taking on: December 25, 2019   pregabalin 75 MG capsule Commonly known as: LYRICA Take 75 mg by mouth daily.   traZODone 100 MG tablet Commonly known as:  DESYREL Take 1 tablet by mouth at bedtime.            Durable Medical Equipment  (From admission, onward)         Start     Ordered   12/24/19 1040  For home use only DME oxygen  Once    Question Answer Comment  Length of Need Lifetime   Mode or (Route) Nasal cannula   Liters per Minute 2   Frequency Continuous (stationary and portable oxygen unit needed)   Oxygen delivery system Gas      12/24/19 1040         Follow-up Information    Lucia Gaskins, MD. Schedule an appointment as soon as possible for a visit in 1 week(s).   Specialty: Internal Medicine Contact information: [place] [address] [phone]          Allergies  Allergen Reactions  . Iohexol      Desc: CHEST TIGHTNESS,BRETHING PROBLEMS NEEDS PRE MEDS   . Tape Other (See  Comments)    Tears skin  . Ciprofloxacin Rash  . Latex Rash    GLOVES   . Penicillins Rash  . Povidone-Iodine Rash    Consultations:  Cardiology, GI   Procedures/Studies: CT Chest Wo Contrast  Result Date: 12/19/2019 CLINICAL DATA:  69 year old female with shortness of breath and dizziness. EXAM: CT CHEST WITHOUT CONTRAST TECHNIQUE: Multidetector CT imaging of the chest was performed following the standard protocol without IV contrast. COMPARISON:  Chest radiograph dated 12/19/2019. FINDINGS: Evaluation of this exam is limited in the absence of intravenous contrast. Cardiovascular: There is no cardiomegaly or pericardial effusion. There is 3 vessel coronary vascular calcification. There is hypoattenuation of the cardiac blood pool suggestive of a degree of anemia. Clinical correlation is recommended. Moderate atherosclerotic calcification of the thoracic aorta. The central pulmonary arteries are grossly unremarkable. Mediastinum/Nodes: Mildly enlarged bilateral hilar and mediastinal lymph nodes measure approximately 14 mm in short axis in the prevascular space and 18 mm in short axis in the subcarinal region. Evaluation of the hilar lymph nodes is limited in the absence of intravenous contrast. There is however fullness of the hilar region likely combination of vascular confluence and enlarged lymph nodes. There is an apparent right hilar adenopathy measuring 14 mm in short axis. The esophagus and the thyroid gland are grossly unremarkable. No mediastinal fluid collection. Lungs/Pleura: There are small bilateral pleural effusions, right greater than left. There is diffuse interstitial and interlobular septal prominence consistent with mild edema. There is background of chronic interstitial coarsening. There is a 4.6 x 4.0 cm focal area of consolidation in the right middle lobe corresponding to the opacity seen on the earlier radiograph. This likely represent pneumonia. However, a mass or malignancy  is not excluded. Clinical correlation and close follow-up after treatment and resolution of acute symptoms recommended. There is no pneumothorax. The central airways are patent. Upper Abdomen: Cirrhosis. Cholecystectomy. Small ascites. Musculoskeletal: Degenerative changes of the spine. No acute osseous pathology. IMPRESSION: 1. Focal consolidation in the right middle lobe most consistent with pneumonia. A mass or malignancy is not excluded. Clinical correlation and close follow-up after treatment and resolution of acute symptoms recommended. 2. Small bilateral pleural effusions and findings of interstitial edema. 3. Mildly enlarged bilateral hilar and mediastinal lymph nodes, likely reactive. 4. Cirrhosis with small ascites. 5. Aortic Atherosclerosis (ICD10-I70.0). Electronically Signed   By: Anner Crete M.D.   On: 12/19/2019 18:23   CT Angio Chest PE W/Cm &/Or  Wo Cm  Result Date: 12/19/2019 CLINICAL DATA:  Shortness of breath. EXAM: CT ANGIOGRAPHY CHEST WITH CONTRAST TECHNIQUE: Multidetector CT imaging of the chest was performed using the standard protocol during bolus administration of intravenous contrast. Multiplanar CT image reconstructions and MIPs were obtained to evaluate the vascular anatomy. CONTRAST:  62m OMNIPAQUE IOHEXOL 350 MG/ML SOLN Patient pre-medicated due to reported contrast allergy. COMPARISON:  Noncontrast chest CT earlier this day. FINDINGS: Cardiovascular: There are no filling defects within the pulmonary arteries to suggest pulmonary embolus. Aortic atherosclerosis without dissection. No pericardial effusion. Mediastinum/Nodes: Again seen bilateral hilar and mediastinal adenopathy. Representative prevascular node measures 14 mm short axis, subcarinal node measures 17 mm short axis. Enlarged right hilar node measures 17 mm short axis. Prominent left hilar nodes to a lesser extent. No esophageal wall thickening. No thyroid nodule. Lungs/Pleura: Small bilateral pleural effusions,  right greater than left, unchanged from earlier this day. Interstitial and septal thickening, basilar and subpleural predominance, also unchanged. Focal consolidation in the right middle lobe measuring 4.2 x 3.8 cm abutting the fissure, unchanged. Mild central bronchial thickening. Minimal emphysema. Upper Abdomen: Nodular hepatic contours consistent with cirrhosis. Cholecystectomy. Atherosclerosis of the upper abdominal aorta. Minimal perihepatic ascites again seen. Musculoskeletal: There are no acute or suspicious osseous abnormalities. Again seen degenerative change of the spine. Review of the MIP images confirms the above findings. IMPRESSION: 1. No pulmonary embolus. 2. Exam is otherwise unchanged from noncontrast CT earlier this day. Focal masslike consolidation in the right middle lobe which may represent pneumonia, however radiographic follow-up recommended to document resolution and exclude underlying neoplasm. 3. Small pleural effusions and findings of pulmonary edema. Enlarged mediastinal and hilar lymph nodes, likely reactive but nonspecific in the setting. 4. Cirrhosis. Minimal perihepatic ascites. Aortic Atherosclerosis (ICD10-I70.0). Electronically Signed   By: MKeith RakeM.D.   On: 12/19/2019 23:01   DG Chest Port 1 View  Result Date: 12/21/2019 CLINICAL DATA:  Shortness of breath. EXAM: PORTABLE CHEST 1 VIEW COMPARISON:  December 19, 2019. FINDINGS: Stable cardiomediastinal silhouette. Atherosclerosis of thoracic aorta is noted. No pneumothorax or pleural effusion is noted. Mild bibasilar subsegmental atelectasis is noted. Stable right middle lobe density is noted concerning for pneumonia, atelectasis or mass. Bony thorax is unremarkable. IMPRESSION: Stable right middle lobe density is noted concerning for pneumonia, atelectasis or mass. Continued radiographic follow-up is recommended. Mild bibasilar subsegmental atelectasis is noted. Aortic Atherosclerosis (ICD10-I70.0). Electronically Signed    By: JMarijo ConceptionM.D.   On: 12/21/2019 13:46   DG Chest Port 1 View  Result Date: 12/19/2019 CLINICAL DATA:  69year old female with shortness of breath. EXAM: PORTABLE CHEST 1 VIEW COMPARISON:  Chest radiograph dated 10/02/2015. FINDINGS: There is mild cardiomegaly and mild vascular congestion. Background of chronic interstitial coarsening and mild bronchitic changes. Faint diffuse bilateral peripheral and subpleural hazy densities may represent chronic changes or mild edema. Atypical infiltrate is not excluded. Clinical correlation is recommended. There is a 2.8 x 5.0 cm ovoid opacity in the right mid lung field which may represent a round atelectasis, infiltrate, or loculated fluid within the fissure. A mass is not excluded. Clinical correlation and close follow-up after treatment and resolution of acute symptoms or further evaluation with CT is recommended. There is no pleural effusion or pneumothorax. Atherosclerotic calcification of the aorta. No acute osseous pathology. IMPRESSION: 1. Cardiomegaly with mild vascular congestion. 2. Ovoid opacity in the right mid lung field may represent round atelectasis, infiltrate, or loculated fluid within the fissure. Lung mass is not  excluded. Clinical correlation and close follow-up after treatment and resolution of acute symptoms or further evaluation with CT is recommended. Electronically Signed   By: Anner Crete M.D.   On: 12/19/2019 17:13   ECHOCARDIOGRAM COMPLETE  Result Date: 12/22/2019    ECHOCARDIOGRAM REPORT   Patient Name:   [name] Date of Exam: 12/21/2019 Medical Rec #:  [id number]       Height:       66.0 in Accession #:    [id number]      Weight:       184.8 lb Date of Birth:  Mar 23, 1951       BSA:          1.934 m Patient Age:    67 years        BP:           140/56 mmHg Patient Gender: F               HR:           86 bpm. Exam Location:  Inpatient Procedure: 2D Echo Indications:     chest pain 786.50  History:         Patient has  prior history of Echocardiogram examinations, most                  recent 01/01/2013. CAD, Signs/Symptoms:Dyspnea; Risk                  Factors:Diabetes and Dyslipidemia.  Sonographer:     Johny Chess RDCS Referring Phys:  [id number] [facility] J PATWARDHAN Diagnosing Phys: Vernell Leep MD IMPRESSIONS  1. Left ventricular ejection fraction, by estimation, is 55 to 60%. The left ventricle has normal function. The left ventricle has no regional wall motion abnormalities. Left ventricular diastolic parameters are consistent with Grade II diastolic dysfunction (pseudonormalization).  2. Right ventricular systolic function is normal. The right ventricular size is normal.  3. Left atrial size was mildly dilated.  4. The mitral valve is grossly normal. Mild to moderate mitral valve regurgitation.  5. Trileaflet aortic valve with mild calcification. Mild aortic stenosis. Mean PG 9 mmHg, Vmax 2.0 m/sec, AVA 2.1 cm2.  6. Estimated RA pressure 8 mmHg. FINDINGS  Left Ventricle: Left ventricular ejection fraction, by estimation, is 55 to 60%. The left ventricle has normal function. The left ventricle has no regional wall motion abnormalities. The left ventricular internal cavity size was normal in size. There is  no left ventricular hypertrophy. Left ventricular diastolic parameters are consistent with Grade II diastolic dysfunction (pseudonormalization). Right Ventricle: The right ventricular size is normal. No increase in right ventricular wall thickness. Right ventricular systolic function is normal. Left Atrium: Left atrial size was mildly dilated. Right Atrium: Right atrial size was normal in size. Pericardium: There is no evidence of pericardial effusion. Mitral Valve: The mitral valve is grossly normal. Mild to moderate mitral valve regurgitation. Tricuspid Valve: The tricuspid valve is grossly normal. Tricuspid valve regurgitation is not demonstrated. Aortic Valve: The aortic valve is tricuspid. Aortic valve  regurgitation is not visualized. Mild aortic stenosis is present. There is mild calcification of the aortic valve. Aortic valve mean gradient measures 9.0 mmHg. Aortic valve peak gradient measures  17.3 mmHg. Aortic valve area, by VTI measures 2.20 cm. Pulmonic Valve: The pulmonic valve was normal in structure. Pulmonic valve regurgitation is not visualized. Aorta: The aortic root is normal in size and structure. Venous: The inferior vena cava is normal in size with less than  50% respiratory variability, suggesting right atrial pressure of 8 mmHg. IAS/Shunts: The interatrial septum was not assessed.  LEFT VENTRICLE PLAX 2D LVIDd:         5.00 cm  Diastology LVIDs:         3.70 cm  LV e' lateral:   10.40 cm/s LV PW:         1.00 cm  LV E/e' lateral: 12.7 LV IVS:        0.90 cm  LV e' medial:    7.62 cm/s LVOT diam:     2.00 cm  LV E/e' medial:  17.3 LV SV:         86 LV SV Index:   45 LVOT Area:     3.14 cm  RIGHT VENTRICLE RV S prime:     14.10 cm/s TAPSE (M-mode): 2.2 cm LEFT ATRIUM             Index       RIGHT ATRIUM           Index LA diam:        4.20 cm 2.17 cm/m  RA Area:     15.40 cm LA Vol (A2C):   67.2 ml 34.75 ml/m RA Volume:   41.20 ml  21.31 ml/m LA Vol (A4C):   62.6 ml 32.37 ml/m LA Biplane Vol: 66.4 ml 34.34 ml/m  AORTIC VALVE AV Area (Vmax):    2.17 cm AV Area (Vmean):   1.98 cm AV Area (VTI):     2.20 cm AV Vmax:           208.00 cm/s AV Vmean:          143.000 cm/s AV VTI:            0.393 m AV Peak Grad:      17.3 mmHg AV Mean Grad:      9.0 mmHg LVOT Vmax:         144.00 cm/s LVOT Vmean:        90.300 cm/s LVOT VTI:          0.275 m LVOT/AV VTI ratio: 0.70  AORTA Ao Root diam: 3.00 cm MITRAL VALVE MV Area (PHT): 4.21 cm     SHUNTS MV Decel Time: 180 msec     Systemic VTI:  0.28 m MV E velocity: 132.00 cm/s  Systemic Diam: 2.00 cm MV A velocity: 124.00 cm/s MV E/A ratio:  1.06 Manish Patwardhan MD Electronically signed by Vernell Leep MD Signature Date/Time: 12/22/2019/9:06:18 AM     Final    US Abdomen Limited RUQ  Result Date: 12/21/2019 CLINICAL DATA:  Cirrhosis, search for ascites EXAM: ULTRASOUND ABDOMEN LIMITED RIGHT UPPER QUADRANT COMPARISON:  CT chest 02/18/2020 FINDINGS: Gallbladder: Surgically absent Common bile duct: Diameter: 0.5 cm Liver: No focal lesion identified. Nodular contour with diffuse coarse echotexture. Portal vein is patent on color Doppler imaging with normal direction of blood flow towards the liver. There is trace perihepatic ascites. Other: Small right pleural effusion. IMPRESSION: 1.  Trace perihepatic ascites. 2.  Cirrhotic liver. 3.  Small right pleural effusion. Electronically Signed   By: Audie Pinto M.D.   On: 12/21/2019 12:56      Subjective: Patient seen and examined at the bedside this morning.  Hemodynamically stable for discharge today.  Discharge Exam: Vitals:   12/24/19 1015 12/24/19 1050  BP: (!) 170/73 (!) 153/80  Pulse: 62 61  Resp: 12   Temp:  97.9 F (36.6 C)  SpO2: 99% 95%  Vitals:   12/24/19 1005 12/24/19 1010 12/24/19 1015 12/24/19 1050  BP: (!) 180/76 (!) 177/74 (!) 170/73 (!) 153/80  Pulse: (!) 57 62 62 61  Resp: 17 17 12    Temp:    97.9 F (36.6 C)  TempSrc:    Oral  SpO2: 96% 97% 99% 95%  Weight:      Height:        General: Pt is alert, awake, not in acute distress Cardiovascular: RRR, S1/S2 +, no rubs, no gallops Respiratory: CTA bilaterally, no wheezing, no rhonchi Abdominal: Soft, NT, ND, bowel sounds + Extremities: no edema, no cyanosis    The results of significant diagnostics from this hospitalization (including imaging, microbiology, ancillary and laboratory) are listed below for reference.     Microbiology: Recent Results (from the past 240 hour(s))  Respiratory Panel by RT PCR (Flu A&B, Covid) - Nasopharyngeal Swab     Status: None   Collection Time: 12/20/19 12:30 AM   Specimen: Nasopharyngeal Swab  Result Value Ref Range Status   SARS Coronavirus 2 by RT PCR NEGATIVE  NEGATIVE Final    Comment: (NOTE) SARS-CoV-2 target nucleic acids are NOT DETECTED. The SARS-CoV-2 RNA is generally detectable in upper respiratoy specimens during the acute phase of infection. The lowest concentration of SARS-CoV-2 viral copies this assay can detect is 131 copies/mL. A negative result does not preclude SARS-Cov-2 infection and should not be used as the sole basis for treatment or other patient management decisions. A negative result may occur with  improper specimen collection/handling, submission of specimen other than nasopharyngeal swab, presence of viral mutation(s) within the areas targeted by this assay, and inadequate number of viral copies (<131 copies/mL). A negative result must be combined with clinical observations, patient history, and epidemiological information. The expected result is Negative. Fact Sheet for Patients:  PinkCheek.be Fact Sheet for Healthcare Providers:  GravelBags.it This test is not yet ap proved or cleared by the [place] FDA and  has been authorized for detection and/or diagnosis of SARS-CoV-2 by FDA under an Emergency Use Authorization (EUA). This EUA will remain  in effect (meaning this test can be used) for the duration of the COVID-19 declaration under Section 564(b)(1) of the Act, 21 U.S.C. section 360bbb-3(b)(1), unless the authorization is terminated or revoked sooner.    Influenza A by PCR NEGATIVE NEGATIVE Final   Influenza B by PCR NEGATIVE NEGATIVE Final    Comment: (NOTE) The Xpert Xpress SARS-CoV-2/FLU/RSV assay is intended as an aid in  the diagnosis of influenza from Nasopharyngeal swab specimens and  should not be used as a sole basis for treatment. Nasal washings and  aspirates are unacceptable for Xpert Xpress SARS-CoV-2/FLU/RSV  testing. Fact Sheet for Patients: PinkCheek.be Fact Sheet for Healthcare  Providers: GravelBags.it This test is not yet approved or cleared by the [place] FDA and  has been authorized for detection and/or diagnosis of SARS-CoV-2 by  FDA under an Emergency Use Authorization (EUA). This EUA will remain  in effect (meaning this test can be used) for the duration of the  Covid-19 declaration under Section 564(b)(1) of the Act, 21  U.S.C. section 360bbb-3(b)(1), unless the authorization is  terminated or revoked. Performed at [facility], [address], [place], [place] [address]      Labs: BNP (last 3 results) Recent Labs    12/19/19 1710  BNP 947.0*   Basic Metabolic Panel: Recent Labs  Lab 12/19/19 1710 12/20/19 0437 12/21/19 0316 12/22/19 0313 12/23/19 0915  NA 133*  133* 135 137 143  K 5.2* 5.7* 4.6 5.1 3.6  CL 102 102 101 105 108  CO2 24 23 23  21* 27  GLUCOSE 123* 196* 208* 274* 75  BUN 19 23 32* 36* 19  CREATININE 1.23* 1.34* 1.51* 1.49* 1.10*  CALCIUM 9.2 8.8* 8.6* 8.8* 8.8*  MG  --   --   --  2.1 2.0   Liver Function Tests: Recent Labs  Lab 12/19/19 1710 12/20/19 0437  AST 15 15  ALT 13 12  ALKPHOS 61 61  BILITOT 0.5 0.3  PROT 7.5 7.1  ALBUMIN 3.6 3.5   No results for input(s): LIPASE, AMYLASE in the last 168 hours. No results for input(s): AMMONIA in the last 168 hours. CBC: Recent Labs  Lab 12/19/19 1710 12/19/19 1710 12/20/19 0437 12/21/19 0316 12/22/19 0345 12/23/19 0915 12/24/19 0739  WBC 6.5   < > 6.2 6.5 6.7 5.8 5.7  NEUTROABS 3.9  --   --  5.3 5.4 3.9 4.7  HGB 8.1*   < > 7.7* 7.8* 7.4* 7.8* 7.9*  HCT 27.8*   < > 26.1* 26.3* 24.3* 26.4* 27.1*  MCV 89.1   < > 89.7 86.5 86.8 86.8 88.0  PLT 246   < > 231 254 254 242 233   < > = values in this interval not displayed.   Cardiac Enzymes: No results for input(s): CKTOTAL, CKMB, CKMBINDEX, TROPONINI in the last 168 hours. BNP: Invalid input(s): POCBNP CBG: Recent Labs  Lab 12/22/19 0741 12/22/19 1127 12/22/19 1646  12/23/19 2206 12/24/19 0956  GLUCAP 297* 255* 131* 361* 181*   D-Dimer No results for input(s): DDIMER in the last 72 hours. Hgb A1c No results for input(s): HGBA1C in the last 72 hours. Lipid Profile No results for input(s): CHOL, HDL, LDLCALC, TRIG, CHOLHDL, LDLDIRECT in the last 72 hours. Thyroid function studies No results for input(s): TSH, T4TOTAL, T3FREE, THYROIDAB in the last 72 hours.  Invalid input(s): FREET3 Anemia work up No results for input(s): VITAMINB12, FOLATE, FERRITIN, TIBC, IRON, RETICCTPCT in the last 72 hours. Urinalysis    Component Value Date/Time   COLORURINE YELLOW 09/20/2015 1550   APPEARANCEUR CLEAR 09/20/2015 1550   LABSPEC 1.025 09/20/2015 1550   PHURINE 5.5 09/20/2015 1550   GLUCOSEU NEGATIVE 09/20/2015 1550   HGBUR TRACE (A) 09/20/2015 1550   BILIRUBINUR NEGATIVE 09/20/2015 1550   KETONESUR NEGATIVE 09/20/2015 1550   PROTEINUR >300 (A) 09/20/2015 1550   UROBILINOGEN 0.2 09/04/2014 1841   NITRITE NEGATIVE 09/20/2015 1550   LEUKOCYTESUR NEGATIVE 09/20/2015 1550   Sepsis Labs Invalid input(s): PROCALCITONIN,  WBC,  LACTICIDVEN Microbiology Recent Results (from the past 240 hour(s))  Respiratory Panel by RT PCR (Flu A&B, Covid) - Nasopharyngeal Swab     Status: None   Collection Time: 12/20/19 12:30 AM   Specimen: Nasopharyngeal Swab  Result Value Ref Range Status   SARS Coronavirus 2 by RT PCR NEGATIVE NEGATIVE Final    Comment: (NOTE) SARS-CoV-2 target nucleic acids are NOT DETECTED. The SARS-CoV-2 RNA is generally detectable in upper respiratoy specimens during the acute phase of infection. The lowest concentration of SARS-CoV-2 viral copies this assay can detect is 131 copies/mL. A negative result does not preclude SARS-Cov-2 infection and should not be used as the sole basis for treatment or other patient management decisions. A negative result may occur with  improper specimen collection/handling, submission of specimen other than  nasopharyngeal swab, presence of viral mutation(s) within the areas targeted by this assay, and inadequate number of viral  copies (<131 copies/mL). A negative result must be combined with clinical observations, patient history, and epidemiological information. The expected result is Negative. Fact Sheet for Patients:  PinkCheek.be Fact Sheet for Healthcare Providers:  GravelBags.it This test is not yet ap proved or cleared by the [place] FDA and  has been authorized for detection and/or diagnosis of SARS-CoV-2 by FDA under an Emergency Use Authorization (EUA). This EUA will remain  in effect (meaning this test can be used) for the duration of the COVID-19 declaration under Section 564(b)(1) of the Act, 21 U.S.C. section 360bbb-3(b)(1), unless the authorization is terminated or revoked sooner.    Influenza A by PCR NEGATIVE NEGATIVE Final   Influenza B by PCR NEGATIVE NEGATIVE Final    Comment: (NOTE) The Xpert Xpress SARS-CoV-2/FLU/RSV assay is intended as an aid in  the diagnosis of influenza from Nasopharyngeal swab specimens and  should not be used as a sole basis for treatment. Nasal washings and  aspirates are unacceptable for Xpert Xpress SARS-CoV-2/FLU/RSV  testing. Fact Sheet for Patients: PinkCheek.be Fact Sheet for Healthcare Providers: GravelBags.it This test is not yet approved or cleared by the [place] FDA and  has been authorized for detection and/or diagnosis of SARS-CoV-2 by  FDA under an Emergency Use Authorization (EUA). This EUA will remain  in effect (meaning this test can be used) for the duration of the  Covid-19 declaration under Section 564(b)(1) of the Act, 21  U.S.C. section 360bbb-3(b)(1), unless the authorization is  terminated or revoked. Performed at [facility], [address], [place], [place] [address]     Please  note: You were cared for by a hospitalist during your hospital stay. Once you are discharged, your primary care physician will handle any further medical issues. Please note that NO REFILLS for any discharge medications will be authorized once you are discharged, as it is imperative that you return to your primary care physician (or establish a relationship with a primary care physician if you do not have one) for your post hospital discharge needs so that they can reassess your need for medications and monitor your lab values.    Time coordinating discharge: 40 minutes  SIGNED:   Shelly Coss, MD  [facility] 12/24/2019, 1:32 PM Pager [phone]  If 7PM-7AM, please contact night-coverage [email or web address] Password [email or web address]

## 2019-12-24 NOTE — Progress Notes (Signed)
PT Cancellation Note  Patient Details Name: [name] MRN: [id number] DOB: June 21, 1951   Cancelled Treatment:    Reason Eval/Treat Not Completed: Patient at procedure or test/unavailable. Attempted to see pt at this time but pt was out of room to cath lab. Will f/u at a later time.  Horald Chestnut, PT    Delford Field 12/24/2019, 9:42 AM

## 2019-12-24 NOTE — Discharge Instructions (Addendum)
Femoral Site Care This sheet gives you information about how to care for yourself after your procedure. Your health care provider may also give you more specific instructions. If you have problems or questions, contact your health care provider. What can I expect after the procedure? After the procedure, it is common to have:  Bruising that usually fades within 1-2 weeks.  Tenderness at the site. Follow these instructions at home: Wound care  Follow instructions from your health care provider about how to take care of your insertion site. Make sure you: ? Wash your hands with soap and water before you change your bandage (dressing). If soap and water are not available, use hand sanitizer. ? Change your dressing as told by your health care provider. ? Leave stitches (sutures), skin glue, or adhesive strips in place. These skin closures may need to stay in place for 2 weeks or longer. If adhesive strip edges start to loosen and curl up, you may trim the loose edges. Do not remove adhesive strips completely unless your health care provider tells you to do that.  Do not take baths, swim, or use a hot tub until your health care provider approves.  You may shower 24-48 hours after the procedure or as told by your health care provider. ? Gently wash the site with plain soap and water. ? Pat the area dry with a clean towel. ? Do not rub the site. This may cause bleeding.  Do not apply powder or lotion to the site. Keep the site clean and dry.  Check your femoral site every day for signs of infection. Check for: ? Redness, swelling, or pain. ? Fluid or blood. ? Warmth. ? Pus or a bad smell. Activity  For the first 2-3 days after your procedure, or as long as directed: ? Avoid climbing stairs as much as possible. ? Do not squat.  Do not lift anything that is heavier than 10 lb (4.5 kg), or the limit that you are told, until your health care provider says that it is safe.  Rest as  directed. ? Avoid sitting for a long time without moving. Get up to take short walks every 1-2 hours.  Do not drive for 24 hours if you were given a medicine to help you relax (sedative). General instructions  Take over-the-counter and prescription medicines only as told by your health care provider.  Keep all follow-up visits as told by your health care provider. This is important. Contact a health care provider if you have:  A fever or chills.  You have redness, swelling, or pain around your insertion site. Get help right away if:  The catheter insertion area swells very fast.  You pass out.  You suddenly start to sweat or your skin gets clammy.  The catheter insertion area is bleeding, and the bleeding does not stop when you hold steady pressure on the area.  The area near or just beyond the catheter insertion site becomes pale, cool, tingly, or numb. These symptoms may represent a serious problem that is an emergency. Do not wait to see if the symptoms will go away. Get medical help right away. Call your local emergency services (911 in the [place] Do not drive yourself to the hospital. Summary  After the procedure, it is common to have bruising that usually fades within 1-2 weeks.  Check your femoral site every day for signs of infection.  Do not lift anything that is heavier than 10 lb (4.5 kg), or the   limit that you are told, until your health care provider says that it is safe. This information is not intended to replace advice given to you by your health care provider. Make sure you discuss any questions you have with your health care provider. Document Revised: 10/14/2017 Document Reviewed: 10/14/2017 Elsevier Patient Education  2020 Elsevier Inc.  

## 2019-12-24 NOTE — Interval H&P Note (Signed)
History and Physical Interval Note:  12/24/2019 7:51 AM  [name]  has presented today for surgery, with the diagnosis of n stemi.  The various methods of treatment have been discussed with the patient and family. After consideration of risks, benefits and other options for treatment, the patient has consented to  Procedure(s): LEFT HEART CATH AND CORONARY ANGIOGRAPHY (N/A) as a surgical intervention.  The patient's history has been reviewed, patient examined, no change in status, stable for surgery.  I have reviewed the patient's chart and labs.  Questions were answered to the patient's satisfaction.    2016 Appropriate Use Criteria for Coronary Revascularization in Patients With Acute Coronary Syndrome NSTEMI/UA High Risk (TIMI Score 5-7) NSTEMI/Unstable angina, stabilized patient at high risk Link Here: sistemancia.com Indication:  Revascularization by PCI or CABG of 1 or more arteries in a patient with NSTEMI or unstable angina with Stabilization after presentation High risk for clinical events  A (7) Indication: 16; Score 7    Chauncey

## 2019-12-24 NOTE — Progress Notes (Addendum)
Site area: Right groin a 5 french arterial sheath was removed Gladstone Lighter RN  Site Prior to Removal:  Level 0  Pressure Applied For 20 MINUTES    Bedrest Beginning at 1010am  Manual:   Yes.    Patient Status During Pull:  stable  Post Pull Groin Site:  Level 0  Post Pull Instructions Given:  Yes.    Post Pull Pulses Present:  Yes.    Dressing Applied:  Yes.    Comments:

## 2019-12-24 NOTE — Progress Notes (Signed)
No significant CAD in left system. Prox RCA 95% calcific stenosis with TIMI II flow with left-to-right colalterals LVEDP 28 mmHg. Hb 7.9 g/dl.  She needs intervention to her prox RCA. However, need to optimize her Hb and LVEDP first. I gave her the option of optimizing over the weekend and intervening on Monday/Tuesday. However, patient wants to go home today. I recommend the following prior to discharge.  1 U PRBC to optimize Hb. Iron supplementation on discharge. If okay with GI, start plavix and evaluate for any further bleeding, prior to coronary stenting.  IV lasix 40 mg ordered, discharge on lasix 40 PO bid.  I will arrange repeat CBC next week and outpatient follow up. Tentatively, plan for planned coronary intervention on 3/23.  Discussed with hospitalist Dr. Tawanna Solo.  Nigel Mormon, MD [facility] [place] Pager: [phone] Office: [phone]

## 2019-12-25 LAB — BPAM RBC
Blood Product Expiration Date: 202103232359
ISSUE DATE / TIME: 202103111429
Unit Type and Rh: 600

## 2019-12-25 LAB — TYPE AND SCREEN
ABO/RH(D): A NEG
Antibody Screen: NEGATIVE
Unit division: 0

## 2019-12-25 LAB — SURGICAL PATHOLOGY

## 2019-12-25 MED FILL — Verapamil HCl IV Soln 2.5 MG/ML: INTRAVENOUS | Qty: 2 | Status: AC

## 2019-12-30 LAB — CBC
Hematocrit: 38 % (ref 34.0–46.6)
Hemoglobin: 12 g/dL (ref 11.1–15.9)
MCH: 27.4 pg (ref 26.6–33.0)
MCHC: 31.6 g/dL (ref 31.5–35.7)
MCV: 87 fL (ref 79–97)
Platelets: 290 10*3/uL (ref 150–450)
RBC: 4.38 x10E6/uL (ref 3.77–5.28)
RDW: 18.5 % — ABNORMAL HIGH (ref 11.7–15.4)
WBC: 11.5 10*3/uL — ABNORMAL HIGH (ref 3.4–10.8)

## 2019-12-30 LAB — BASIC METABOLIC PANEL
BUN/Creatinine Ratio: 17 (ref 12–28)
BUN: 29 mg/dL — ABNORMAL HIGH (ref 8–27)
CO2: 26 mmol/L (ref 20–29)
Calcium: 9.6 mg/dL (ref 8.7–10.3)
Chloride: 97 mmol/L (ref 96–106)
Creatinine, Ser: 1.67 mg/dL — ABNORMAL HIGH (ref 0.57–1.00)
GFR calc Af Amer: 36 mL/min/{1.73_m2} — ABNORMAL LOW (ref >59)
GFR calc non Af Amer: 31 mL/min/{1.73_m2} — ABNORMAL LOW (ref >59)
Glucose: 124 mg/dL — ABNORMAL HIGH (ref 65–99)
Potassium: 4.8 mmol/L (ref 3.5–5.2)
Sodium: 140 mmol/L (ref 134–144)

## 2019-12-31 ENCOUNTER — Emergency Department (HOSPITAL_COMMUNITY): Payer: Medicare HMO

## 2019-12-31 ENCOUNTER — Encounter (HOSPITAL_COMMUNITY): Payer: Self-pay

## 2019-12-31 ENCOUNTER — Encounter: Payer: Self-pay | Admitting: Cardiology

## 2019-12-31 ENCOUNTER — Other Ambulatory Visit: Payer: Self-pay

## 2019-12-31 ENCOUNTER — Emergency Department (HOSPITAL_COMMUNITY)
Admission: EM | Admit: 2019-12-31 | Discharge: 2019-12-31 | Discharge status: Against Medical Advice | Payer: Medicare HMO | Attending: Emergency Medicine | Admitting: Emergency Medicine

## 2019-12-31 ENCOUNTER — Ambulatory Visit: Payer: Medicare HMO | Admitting: Cardiology

## 2019-12-31 VITALS — BP 88/47 | HR 79 | Temp 96.9°F | Ht 66.0 in | Wt 171.0 lb

## 2019-12-31 DIAGNOSIS — Z5320 Procedure and treatment not carried out because of patient's decision for unspecified reasons: Secondary | ICD-10-CM | POA: Diagnosis not present

## 2019-12-31 DIAGNOSIS — I951 Orthostatic hypotension: Secondary | ICD-10-CM | POA: Insufficient documentation

## 2019-12-31 DIAGNOSIS — D649 Anemia, unspecified: Secondary | ICD-10-CM

## 2019-12-31 DIAGNOSIS — E039 Hypothyroidism, unspecified: Secondary | ICD-10-CM | POA: Insufficient documentation

## 2019-12-31 DIAGNOSIS — K625 Hemorrhage of anus and rectum: Secondary | ICD-10-CM | POA: Insufficient documentation

## 2019-12-31 DIAGNOSIS — I959 Hypotension, unspecified: Secondary | ICD-10-CM | POA: Diagnosis present

## 2019-12-31 DIAGNOSIS — R5383 Other fatigue: Secondary | ICD-10-CM | POA: Insufficient documentation

## 2019-12-31 DIAGNOSIS — Z79899 Other long term (current) drug therapy: Secondary | ICD-10-CM | POA: Insufficient documentation

## 2019-12-31 DIAGNOSIS — R42 Dizziness and giddiness: Secondary | ICD-10-CM | POA: Insufficient documentation

## 2019-12-31 DIAGNOSIS — J449 Chronic obstructive pulmonary disease, unspecified: Secondary | ICD-10-CM | POA: Diagnosis not present

## 2019-12-31 DIAGNOSIS — Z87891 Personal history of nicotine dependence: Secondary | ICD-10-CM | POA: Insufficient documentation

## 2019-12-31 DIAGNOSIS — R197 Diarrhea, unspecified: Secondary | ICD-10-CM | POA: Insufficient documentation

## 2019-12-31 DIAGNOSIS — I252 Old myocardial infarction: Secondary | ICD-10-CM | POA: Insufficient documentation

## 2019-12-31 DIAGNOSIS — Z91040 Latex allergy status: Secondary | ICD-10-CM | POA: Diagnosis not present

## 2019-12-31 DIAGNOSIS — E1140 Type 2 diabetes mellitus with diabetic neuropathy, unspecified: Secondary | ICD-10-CM | POA: Diagnosis not present

## 2019-12-31 DIAGNOSIS — I25118 Atherosclerotic heart disease of native coronary artery with other forms of angina pectoris: Secondary | ICD-10-CM

## 2019-12-31 DIAGNOSIS — Z794 Long term (current) use of insulin: Secondary | ICD-10-CM | POA: Insufficient documentation

## 2019-12-31 LAB — BASIC METABOLIC PANEL
Anion gap: 11 (ref 5–15)
BUN: 32 mg/dL — ABNORMAL HIGH (ref 8–23)
CO2: 26 mmol/L (ref 22–32)
Calcium: 9.6 mg/dL (ref 8.9–10.3)
Chloride: 100 mmol/L (ref 98–111)
Creatinine, Ser: 2.26 mg/dL — ABNORMAL HIGH (ref 0.44–1.00)
GFR calc Af Amer: 25 mL/min — ABNORMAL LOW (ref >60)
GFR calc non Af Amer: 22 mL/min — ABNORMAL LOW (ref >60)
Glucose, Bld: 210 mg/dL — ABNORMAL HIGH (ref 70–99)
Potassium: 4.7 mmol/L (ref 3.5–5.1)
Sodium: 137 mmol/L (ref 135–145)

## 2019-12-31 LAB — CBC
HCT: 37.1 % (ref 36.0–46.0)
Hemoglobin: 11 g/dL — ABNORMAL LOW (ref 12.0–15.0)
MCH: 27 pg (ref 26.0–34.0)
MCHC: 29.6 g/dL — ABNORMAL LOW (ref 30.0–36.0)
MCV: 90.9 fL (ref 80.0–100.0)
Platelets: 222 10*3/uL (ref 150–400)
RBC: 4.08 MIL/uL (ref 3.87–5.11)
RDW: 20.2 % — ABNORMAL HIGH (ref 11.5–15.5)
WBC: 10.1 10*3/uL (ref 4.0–10.5)
nRBC: 0 % (ref 0.0–0.2)

## 2019-12-31 LAB — TYPE AND SCREEN
ABO/RH(D): A NEG
Antibody Screen: NEGATIVE

## 2019-12-31 LAB — TROPONIN I (HIGH SENSITIVITY)
Troponin I (High Sensitivity): 41 ng/L — ABNORMAL HIGH (ref <18)
Troponin I (High Sensitivity): 39 ng/L — ABNORMAL HIGH (ref <18)

## 2019-12-31 LAB — POC OCCULT BLOOD, ED: Fecal Occult Bld: NEGATIVE

## 2019-12-31 MED ORDER — LACTATED RINGERS IV BOLUS
1000.0000 mL | Freq: Once | INTRAVENOUS | Status: AC
  Administered 2019-12-31: 19:00:00 1000 mL via INTRAVENOUS

## 2019-12-31 MED ORDER — SODIUM CHLORIDE 0.9% FLUSH: 3.0000 mL | Freq: Once | INTRAVENOUS | Status: DC

## 2019-12-31 NOTE — ED Triage Notes (Signed)
Pt arrives to ED w/ c/o hypotension. Pt seen by her cardiologist today for orthostatic hypotension (66/50 standing). Pt recently admitted anemia and hx of recent MI.

## 2019-12-31 NOTE — Progress Notes (Signed)
Follow up visit  Subjective:   [name], female    DOB: 28-Oct-1950, 69 y.o.   MRN: [id number]   HPI  Chief Complaint  Patient presents with  . Coronary Artery Disease  . Follow-up    69 y.o. Caucasian female  with nonobstructive coronary artery disease (cath 2010), controlled hypertension, type 2 diabetes mellitus, hyperlipidemia, former smoker, hypothyroidism, COPD/asthma, depression, was admitted with to [facility] with chest pain, found to have NSTEMI, CHF, anemia. She underwent coronary angiography that showed severe prox RCA short segment of CTO with left-to-right and right-to-right bridging collaterals. Nonobstructive disease seen in LCx.  Given her LVEDP of 28 mmHg and Hb of 7.9 g/dl (albeit without active bleeding seen on EGD), I gave her the option of optimizing over the weekend and intervening on Monday/Tuesday. However, patient wants to go home today. I recommended the following prior to discharge.  1 U PRBC to optimize Hb. Iron supplementation on discharge. If okay with GI, start plavix and evaluate for any further bleeding, prior to coronary stenting.  IV lasix 40 mg ordered, discharge on lasix 40 PO bid. I will arrange repeat CBC next week and outpatient follow up. Tentatively, plan for planned coronary intervention on 3/23.  Patient's Hb has come up to 12 g. However, Cr has increased to 1.67. She is here for follow up visit with her friend [place]. Patient has noticed bright rood blood on toilet paper. She has also noticed dark colored stools. She has felt lightheaded and dizzy. She has not had any chest pain.   Current Outpatient Medications on File Prior to Visit  Medication Sig Dispense Refill  . albuterol (PROVENTIL HFA;VENTOLIN HFA) 108 (90 BASE) MCG/ACT inhaler Inhale 2 puffs into the lungs every 4 (four) hours as needed for wheezing or shortness of breath. 1 Inhaler 0  . cefdinir (OMNICEF) 300 MG capsule Take 300 mg by mouth every 12 (twelve)  hours.    . clopidogrel (PLAVIX) 75 MG tablet Take 1 tablet (75 mg total) by mouth daily. 30 tablet 1  . ferrous sulfate 325 (65 FE) MG tablet Take 1 tablet (325 mg total) by mouth daily with breakfast. (Patient taking differently: Take 325 mg by mouth daily at 12 noon. ) 30 tablet 1  . furosemide (LASIX) 40 MG tablet Take 1 tablet (40 mg total) by mouth 2 (two) times daily. 30 tablet 1  . LANTUS SOLOSTAR 100 UNIT/ML Solostar Pen Inject 40 Units into the skin daily.     Marland Kitchen levothyroxine (SYNTHROID) 125 MCG tablet Take 125 mcg by mouth daily.    Marland Kitchen lisinopril (PRINIVIL,ZESTRIL) 20 MG tablet Take 20 mg by mouth daily.      Marland Kitchen LORazepam (ATIVAN) 1 MG tablet Take 1 mg by mouth 2 (two) times daily as needed for anxiety.     . metFORMIN (GLUCOPHAGE) 500 MG tablet Take 500 mg by mouth 2 (two) times daily with a meal.     . Multiple Vitamins-Minerals (MULTIVITAMIN WITH MINERALS) tablet Take 1 tablet by mouth daily.    Marland Kitchen oxyCODONE-acetaminophen (PERCOCET/ROXICET) 5-325 MG tablet Take 1-2 tablets by mouth every 4 (four) hours as needed for severe pain. 12 tablet 0  . pantoprazole (PROTONIX) 40 MG tablet Take 40 mg by mouth daily.    Marland Kitchen PARoxetine (PAXIL) 20 MG tablet Take 20 mg by mouth daily.     . potassium chloride 20 MEQ TBCR Take 40 mEq by mouth daily. (Patient taking differently: Take 20 mEq by mouth daily. )  60 tablet 1  . pravastatin (PRAVACHOL) 40 MG tablet Take 40 mg by mouth daily.      . pregabalin (LYRICA) 75 MG capsule Take 75 mg by mouth daily.     . traZODone (DESYREL) 100 MG tablet Take 100 mg by mouth at bedtime.      No current facility-administered medications on file prior to visit.    Cardiovascular & other pertient studies:  Coronary angiography 12/24/2019: LM: Normal LAD: Minimal luminal irregularities LCx: Prox LCx 30%, prox 50% OM1 stenosis      RCA: Prox RCA CTO with right-to right bridging collaterals, and left-to-right collaterals  LVEDP 28 mmHg. Hb 7.9 g/dl.    Echocardiogram 12/21/2019: 1. Left ventricular ejection fraction, by estimation, is 55 to 60%. The  left ventricle has normal function. The left ventricle has no regional  wall motion abnormalities. Left ventricular diastolic parameters are  consistent with Grade II diastolic  dysfunction (pseudonormalization).  2. Right ventricular systolic function is normal. The right ventricular  size is normal.  3. Left atrial size was mildly dilated.  4. The mitral valve is grossly normal. Mild to moderate mitral valve  regurgitation.  5. Trileaflet aortic valve with mild calcification. Mild aortic stenosis.  Mean PG 9 mmHg, Vmax 2.0 m/sec, AVA 2.1 cm2.  6. Estimated RA pressure 8 mmHg.   Recent labs: 12/29/2019: Glucose 124, BUN/Cr 29/1.67. EGFR 31. Na/K 140/4.8.  H/H 12/38. MCV 87. Platelets 290  12/23/2019: Glucose 75, BUN/Cr 19/1.1. EGFR 52. Na/K 143/3.6. Rest of the CMP normal H/H 7.9/27.1. MCV 88. Platelets 233 HbA1C 8.2%  2015: TSH 3.8normal  2010: Chol 142, TG 189, HDL 36, LDL 68    Review of Systems  Cardiovascular: Negative for chest pain, dyspnea on exertion, leg swelling, palpitations and syncope.  Gastrointestinal: Positive for melena.  Neurological: Positive for light-headedness.  Psychiatric/Behavioral: Positive for memory loss.         Vitals:   12/31/19 1415  BP: (!) 71/51  Pulse: 93  Temp: (!) 96.9 F (36.1 C)  SpO2: 92%   Orthostatic VS for the past 72 hrs (Last 3 readings):  Orthostatic BP Patient Position BP Location Cuff Size Orthostatic Pulse  12/31/19 1437 (!) 66/50 Standing Left Arm Normal 94  12/31/19 1436 (!) 80/47 Sitting Left Arm Normal 92  12/31/19 1435 95/53 Supine Left Arm Normal 86      Body mass index is 27.6 kg/m. Filed Weights   12/31/19 1415  Weight: 171 lb (77.6 kg)     Objective:   Physical Exam  Constitutional: She appears well-developed and well-nourished. She appears distressed (Pale appearing).  Pale  appearing   Neck: No JVD present.  Cardiovascular: Normal rate, regular rhythm and normal heart sounds. Exam reveals decreased pulses.  No murmur heard. Pulmonary/Chest: Effort normal and breath sounds normal. She has no wheezes. She has no rales.  Musculoskeletal:        General: No edema.  Nursing note and vitals reviewed.          Assessment & Recommendations:   69 y.o. Caucasian female  with CAD (prox RCA CTO, rest nonobstructive CAD), HFpEF, anemia, hypertension, type 2 diabetes mellitus, hyperlipidemia, former smoker, hypothyroidism, COPD/asthma, depression, now with lightheadedness, hypotension.  Orthostatic hypotension: Profoundly hypotensive with symptoms of lightheadedness.  Standing blood pressure 66/50 mmHg.  I am concerned that patient is either dehydrated or he is having active GI bleeding again.  I will send her to the emergency room for further evaluation and admission to the  medicine service as indicated.  If she is anemic, she will likely need blood transfusion and further GI work-up.  Recommend holding Plavix at this time given concern for GI bleed.  I offered the patient to be transferred to emergency room with EMS.  However, patient feels more comfortable that her friend drives her to the emergency room.  She is currently not having any syncope, able to speak complete sentences.  Coronary artery disease: Proximal RCA CTO for which intervention is scheduled for 01/05/2020.  She was started on a trial of aspirin and Plavix prior to elective coronary stenting.  However, should she be having recurrent GI bleed, further intervention will likely need to be readdressed.  Fortunately, she is not having any active chest pain at this time.  Patient is at risk of life-threatening decompensation due to possible GI bleed, acute blood loss anemia, and orthostatic hypotension.  Nigel Mormon, MD [facility] [place] Pager: [phone] Office: [phone]

## 2019-12-31 NOTE — ED Provider Notes (Signed)
[facility] EMERGENCY DEPARTMENT Provider Note   CSN: [id number] Arrival date & time: 12/31/19  1522     History Chief Complaint  Patient presents with  . Hypotension    [name] is a 69 y.o. female.  HPI  Patient is a 69 year old female with a PMH of hypothyroidism, insulin-dependent diabetes, hyperlipidemia, CAD, GERD, IBS, CKD, COPD, CHF with preserved EF presenting to the ED today from her cardiologist office due to hypotension.  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  Patient was being evaluated in cardiology office today prior to procedure.  However, patient found to be persistently hypotensive with BP is 80/47 with worsening to 66/50 upon standing.  Patient had reported that she had experienced multiple black stools and so she was sent to the ED for further evaluation of potential GI bleed.  On arrival, patient reports that she was recently placed on iron supplementation due to normocytic anemia.  Her baseline hemoglobin appears to be around 7.  And she recently underwent an EGD/colonoscopy which showed mild gastritis and AVM on transverse colon without active bleeding.  Patient says that since beginning iron supplements, she has had 3-4 episodes of large-volume black loose stool every day.  She says that the increased frequency of bowel movements has caused her backside to feel raw and she has noticed some bright red bleeding occasionally with wiping.  She has experienced dizziness, chills and fatigue over the past 2 days.  She denies abdominal pain, fever, chest pain or shortness of breath.    Past Medical History:  Diagnosis Date  . Allergic rhinitis   . 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 ASCVD (arteriosclerotic cardiovascular disease)    MI in 96 requiring BMS CX; DES to M1 in 2000;normal coronary angiography in 2004  . Cholelithiasis   . COPD  (chronic obstructive pulmonary disease) ([place])   . DDD (degenerative disc disease), lumbar   . Depression   . Diabetes mellitus   . Diabetes mellitus type I ([place])   . DVT (deep venous thrombosis) ([place])   . GERD (gastroesophageal reflux disease)   . Hyperlipidemia   . Hypothyroidism   . Low back pain   . Nephrolithiasis 2006   stone extraction   . Peripheral neuropathy   . Pneumonia 12/22/2019  . Sciatic pain    right  . Tobacco abuse   . Tremor     Patient Active Problem List   Diagnosis Date Noted  . Orthostatic hypotension 12/31/2019  . Non-ST elevation (NSTEMI) myocardial infarction ([place])   . Anemia   . Dyspnea   . Hx of heart artery stent   . Benign hypertension with CKD (chronic kidney disease) stage III   . Former smoker   . Chest pain 12/20/2019  . Weakness 09/04/2014  . Dizziness 09/04/2014  . Hepatomegaly 03/31/2014  . Esophageal dysphagia 03/31/2014  . Unspecified constipation 03/31/2014  . AKI (acute kidney injury) ([place]) 10/30/2013  . Altered mental status 09/28/2013  . ETOH abuse 09/28/2013  . Altered mental state 09/28/2013  . Hyperkalemia 06/05/2013  . Bradycardia 06/05/2013  . Edema 06/02/2013  . Hyponatremia 06/02/2013  . Pneumonia due to infectious agent 12/26/2012  . Insomnia due to mental disorder 09/02/2012  . RECTAL PAIN 09/04/2010  . CHEST PAIN 08/22/2010  . Insulin dependent type 2 diabetes mellitus ([place]) 06/16/2010  . Coronary artery disease  06/16/2010  . MICROALBUMINURIA 06/01/2009  . 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 OTHER DYSPHAGIA 01/05/2009  . COLONIC POLYPS, ADENOMATOUS, HX OF 01/05/2009  . SCHATZKI'S RING, HX OF 01/05/2009  . BACK PAIN 08/04/2008  . DIARRHEA, CHRONIC 08/04/2008  . TOBACCO ABUSE 06/03/2007  . IBS 06/03/2007  . NEPHROLITHIASIS 02/04/2007  . ANXIETY STATE NOS 11/26/2006  . Hypothyroidism 11/01/2006  . Mixed hyperlipidemia 11/01/2006  . Depression 11/01/2006  . PERIPHERAL NEUROPATHY 11/01/2006  . CATARACT  NOS 11/01/2006  . ALLERGIC RHINITIS 11/01/2006  . COPD (chronic obstructive pulmonary disease) ([place]) 11/01/2006  . GERD 11/01/2006  . DEGENERATION, DISC NOS 11/01/2006  . LOW BACK PAIN 11/01/2006  . DVT, HX OF 11/01/2006    Past Surgical History:  Procedure Laterality Date  . BACK SURGERY    . CHOLECYSTECTOMY    . COLONOSCOPY  01/2009   ZOX:WRUEAV rectum/repeat in 5 yrs  . COLONOSCOPY N/A 04/29/2014   Dr.Rourk- attempted/incomplete colonoscopy. inadequate prep  . COLONOSCOPY N/A 05/27/2014   WUJ:[id number] coli. Colonic polyps-removed as described above.Status post segmental biopsy  . COLONOSCOPY WITH PROPOFOL N/A 12/23/2019   Procedure: COLONOSCOPY WITH PROPOFOL;  Surgeon: Ronnette Juniper, MD;  Location: [facility];  Service: Gastroenterology;  Laterality: N/A;  . DILATION AND CURETTAGE OF UTERUS  1974  . ESOPHAGOGASTRODUODENOSCOPY  05/2010   Dr. Tessie Fass, erosion. 16F dilation  . ESOPHAGOGASTRODUODENOSCOPY N/A 04/29/2014   Dr.Rourk- normal esophagus s/p passage of maloney dilator. small hiatal hernia- bx= chronic inflammation.  . ESOPHAGOGASTRODUODENOSCOPY (EGD) WITH PROPOFOL N/A 12/23/2019   Procedure: ESOPHAGOGASTRODUODENOSCOPY (EGD) WITH PROPOFOL;  Surgeon: Ronnette Juniper, MD;  Location: [facility];  Service: Gastroenterology;  Laterality: N/A;  . HAND AMPUTATION Right    traumatic  . HEMOSTASIS CLIP PLACEMENT  12/23/2019   Procedure: HEMOSTASIS CLIP PLACEMENT;  Surgeon: Ronnette Juniper, MD;  Location: [facility];  Service: Gastroenterology;;  . HOT HEMOSTASIS N/A 12/23/2019   Procedure: HOT HEMOSTASIS (ARGON PLASMA COAGULATION/BICAP);  Surgeon: Ronnette Juniper, MD;  Location: [facility];  Service: Gastroenterology;  Laterality: N/A;  . LEFT HEART CATH AND CORONARY ANGIOGRAPHY N/A 12/24/2019   Procedure: LEFT HEART CATH AND CORONARY ANGIOGRAPHY;  Surgeon: Nigel Mormon, MD;  Location: [facility];  Service: Cardiovascular;  Laterality: N/A;  . Venia Minks DILATION N/A 04/29/2014    Procedure: Venia Minks DILATION;  Surgeon: Daneil Dolin, MD;  [facility];  Service: Endoscopy;  Laterality: N/A;  . PARTIAL HYSTERECTOMY  1978  . POLYPECTOMY  12/23/2019   Procedure: POLYPECTOMY;  Surgeon: Ronnette Juniper, MD;  Location: [facility] ENDOSCOPY;  Service: Gastroenterology;;  . SHOULDER SURGERY     Left shoulder for RTC;left arm surgery '98/left hand surgery 2001  . TOTAL ABDOMINAL HYSTERECTOMY W/ BILATERAL SALPINGOOPHORECTOMY  2002  . UMBILICAL HERNIA REPAIR  2008     OB History    Gravida  3   Para  2   Term  2   Preterm      AB  1   Living        SAB  1   TAB      Ectopic      Multiple      Live Births              Family History  Problem Relation Age of Onset  . Depression Mother   . Bipolar disorder Mother   . Dementia Mother   . Pulmonary fibrosis Mother   . Alcohol abuse Father   . Aneurysm Father  deceased age 41, brain  . Colon cancer Paternal Grandfather        age greater than 55    Social History   Tobacco Use  . Smoking status: Former Smoker    Packs/day: 0.50    Years: 40.00    Pack years: 20.00    Types: Cigarettes    Quit date: 08/30/2017    Years since quitting: 2.3  . Smokeless tobacco: Never Used  . Tobacco comment: 1/2 pack per day  Substance Use Topics  . Alcohol use: No  . Drug use: No    Home Medications Prior to Admission medications   Medication Sig Start Date End Date Taking? Authorizing Provider  albuterol (PROVENTIL HFA;VENTOLIN HFA) 108 (90 BASE) MCG/ACT inhaler Inhale 2 puffs into the lungs every 4 (four) hours as needed for wheezing or shortness of breath. 10/02/15   Horton, Barbette Hair, MD  cefdinir (OMNICEF) 300 MG capsule Take 300 mg by mouth every 12 (twelve) hours.    [provider]  clopidogrel (PLAVIX) 75 MG tablet Take 1 tablet (75 mg total) by mouth daily. 12/25/19   Shelly Coss, MD  ferrous sulfate 325 (65 FE) MG tablet Take 1 tablet (325 mg total) by mouth daily with  breakfast. Patient taking differently: Take 325 mg by mouth daily at 12 noon.  12/24/19 12/23/20  Shelly Coss, MD  furosemide (LASIX) 40 MG tablet Take 1 tablet (40 mg total) by mouth 2 (two) times daily. 12/25/19   Shelly Coss, MD  LANTUS SOLOSTAR 100 UNIT/ML Solostar Pen Inject 40 Units into the skin daily.  10/19/19   [provider]  levothyroxine (SYNTHROID) 125 MCG tablet Take 125 mcg by mouth daily. 12/15/19   [provider]  lisinopril (PRINIVIL,ZESTRIL) 20 MG tablet Take 20 mg by mouth daily.      [provider]  LORazepam (ATIVAN) 1 MG tablet Take 1 mg by mouth 2 (two) times daily as needed for anxiety.  10/21/19   [provider]  metFORMIN (GLUCOPHAGE) 500 MG tablet Take 500 mg by mouth 2 (two) times daily with a meal.  09/18/19   [provider]  Multiple Vitamins-Minerals (MULTIVITAMIN WITH MINERALS) tablet Take 1 tablet by mouth daily.    [provider]  oxyCODONE-acetaminophen (PERCOCET/ROXICET) 5-325 MG tablet Take 1-2 tablets by mouth every 4 (four) hours as needed for severe pain. 09/20/15   Nat Christen, MD  pantoprazole (PROTONIX) 40 MG tablet Take 40 mg by mouth daily. 09/29/19   [provider]  PARoxetine (PAXIL) 20 MG tablet Take 20 mg by mouth daily.  09/29/19   [provider]  potassium chloride 20 MEQ TBCR Take 40 mEq by mouth daily. Patient taking differently: Take 20 mEq by mouth daily.  12/24/19 01/23/20  Shelly Coss, MD  pravastatin (PRAVACHOL) 40 MG tablet Take 40 mg by mouth daily.      [provider]  pregabalin (LYRICA) 75 MG capsule Take 75 mg by mouth daily.  10/19/19   [provider]  traZODone (DESYREL) 100 MG tablet Take 100 mg by mouth at bedtime.  08/19/15   [provider]    Allergies    Iohexol, Tape, Ciprofloxacin, Latex, Penicillins, and Povidone-iodine  Review of Systems   Review of Systems  Constitutional: Positive for chills and fatigue.  Negative for appetite change and fever.  HENT: Negative for rhinorrhea and sore throat.   Eyes: Negative for pain and visual disturbance.  Respiratory: Negative for cough, shortness of breath and  wheezing.   Cardiovascular: Negative for chest pain, palpitations and leg swelling.  Gastrointestinal: Positive for diarrhea. Negative for abdominal pain, nausea and vomiting.  Genitourinary: Negative for dysuria and hematuria.  Musculoskeletal: Negative for arthralgias and back pain.  Skin: Negative for rash and wound.  Neurological: Positive for light-headedness. Negative for syncope, weakness and headaches.  Psychiatric/Behavioral: Negative for agitation.  All other systems reviewed and are negative.   Physical Exam Updated Vital Signs BP 93/60   Pulse 87   Temp 99.4 F (37.4 C) (Oral)   Resp 16   Ht 5' 6"  (1.676 m)   Wt 77.6 kg   SpO2 93%   BMI 27.60 kg/m   Physical Exam Vitals and nursing note reviewed.  Constitutional:      General: She is not in acute distress.    Appearance: Normal appearance. She is well-developed. She is not ill-appearing.  HENT:     Head: Normocephalic and atraumatic.     Right Ear: External ear normal.     Left Ear: External ear normal.     Nose: Nose normal. No congestion or rhinorrhea.     Mouth/Throat:     Mouth: Mucous membranes are moist.     Pharynx: Oropharynx is clear.  Eyes:     Extraocular Movements: Extraocular movements intact.     Pupils: Pupils are equal, round, and reactive to light.  Cardiovascular:     Rate and Rhythm: Normal rate and regular rhythm.     Pulses: Normal pulses.     Heart sounds: Normal heart sounds.  Pulmonary:     Effort: Pulmonary effort is normal. No respiratory distress.     Breath sounds: Normal breath sounds. No stridor. No wheezing, rhonchi or rales.  Abdominal:     General: There is no distension.     Palpations: Abdomen is soft.     Tenderness: There is no abdominal tenderness. There is no guarding or  rebound.  Musculoskeletal:        General: Normal range of motion.     Cervical back: Normal range of motion and neck supple. No rigidity.     Right lower leg: No edema.     Left lower leg: No edema.  Skin:    General: Skin is warm and dry.     Capillary Refill: Capillary refill takes less than 2 seconds.  Neurological:     General: No focal deficit present.     Mental Status: She is alert and oriented to person, place, and time. Mental status is at baseline.  Psychiatric:        Mood and Affect: Mood normal.     ED Results / Procedures / Treatments   Labs (all labs ordered are listed, but only abnormal results are displayed) Labs Reviewed  BASIC METABOLIC PANEL - Abnormal; Notable for the following components:      Result Value   Glucose, Bld 210 (*)    BUN 32 (*)    Creatinine, Ser 2.26 (*)    GFR calc non Af Amer 22 (*)    GFR calc Af Amer 25 (*)    All other components within normal limits  CBC - Abnormal; Notable for the following components:   Hemoglobin 11.0 (*)    MCHC 29.6 (*)    RDW 20.2 (*)    All other components within normal limits  TROPONIN I (HIGH SENSITIVITY) - Abnormal; Notable for the following components:   Troponin I (High Sensitivity) 41 (*)    All  other components within normal limits  TROPONIN I (HIGH SENSITIVITY) - Abnormal; Notable for the following components:   Troponin I (High Sensitivity) 39 (*)    All other components within normal limits  SARS CORONAVIRUS 2 (TAT 6-24 HRS)  POC OCCULT BLOOD, ED  TYPE AND SCREEN    EKG EKG Interpretation  Date/Time:  Thursday December 31 2019 16:03:57 EDT Ventricular Rate:  81 PR Interval:    QRS Duration: 89 QT Interval:  358 QTC Calculation: 416 R Axis:   1 Text Interpretation: Sinus rhythm Borderline repolarization abnormality No significant change since last tracing Confirmed by Blanchie Dessert [phone]) on 12/31/2019 4:46:02 PM   Radiology DG Chest 2 View  Result Date: 12/31/2019 CLINICAL  DATA:  Weakness, low blood pressure EXAM: CHEST - 2 VIEW COMPARISON:  12/21/2019 FINDINGS: Mild cardiomegaly. Unchanged masslike opacity of the right midlung. Unchanged mild, diffuse interstitial opacity. Disc degenerative disease of the thoracic spine. IMPRESSION: 1. Unchanged mild, diffuse interstitial opacity, which may reflect edema. No new airspace opacity. 2.  Unchanged masslike opacity of the right midlung. Electronically Signed   By: Eddie Candle M.D.   On: 12/31/2019 16:39    Procedures Procedures (including critical care time)  Medications Ordered in ED Medications  sodium chloride flush (NS) 0.9 % injection 3 mL (has no administration in time range)  lactated ringers bolus 1,000 mL (0 mLs Intravenous Stopped 12/31/19 2001)    ED Course  I have reviewed the triage vital signs and the nursing notes.  Pertinent labs & imaging results that were available during my care of the patient were reviewed by me and considered in my medical decision making (see chart for details).    MDM Rules/Calculators/A&P                     Patient is a 69 year old female with a PMH of hypothyroidism, insulin-dependent diabetes, hyperlipidemia, CAD, GERD, IBS, CKD, COPD, CHF with preserved EF presenting to the ED today from her cardiologist office due to hypotension.  Physical exam unremarkable.  BP 71/51, HR 93, RR 16, SPO2 92% on room air.  Afebrile.  On arrival, patient appears generally well and is displaying no signs of acute distress.  She was sent to the ED from cardiology appointment due to concern for GI bleed.  Patient reporting 3-4 episodes of large-volume, black, loose bowel movements every day for the past week.  She attributed this to beginning iron supplementation after she was found to have normocytic anemia during recent admission.  EKG shows sinus rhythm with T wave inversions in aVF and III that appear new from previous studies.  CBC with hemoglobin 11.0.  BMP with BUN 32, creatinine 2.26  elevated from previous concerning for AKI.  Serial troponins 41 and 39 consecutively.  Performed DRE which is negative for frank blood.  Hemoccult negative.  Chest x-ray shows "unchanged mild, diffuse interstitial opacity, which may reflect edema.  No new airspace opacity."  Given the fact that patient has stable hemoglobin of 11.0 and occult blood negative, lesser suspicion for GI bleed.  Dark black stool potentially caused by iron supplementation.  However, patient does have an AKI.  She was given 1 L IV fluids.  Given her AKI, the decision was made to have patient admitted for further management.  Worsening BUN/creatinine potentially related to overdiuresis as she was recently started on 40 mg Lasix twice daily a week ago.  Patient refusing COVID-19 swab as she says that she was recently tested  on previous admission.  Patient has received 1 dose of Covid vaccine.  Patient told that she could refuse COVID-19 swab but may potentially be placed on Covid floor.  Patient subsequently requested to sign out AMA.  Discussed patient's lab results with her and that risks of leaving would include worsening hypotension, endorgan damage, worsening kidney damage, falls potentially resulting in [place] as patient is anticoagulated and death.  Patient expresses understanding and assumes these risks.  Provided strict return precautions.  Patient says that she will come back if she begins to feel worse.  Patient assessed and evaluated with Dr. Maryan Rued.  Nadeen Landau, MD   Final Clinical Impression(s) / ED Diagnoses Final diagnoses:  Hypotension, unspecified hypotension type    Rx / DC Orders ED Discharge Orders    None       Nadeen Landau, MD 01/01/20 0111    Blanchie Dessert, MD 01/01/20 2144

## 2019-12-31 NOTE — ED Notes (Signed)
2 day hx of dizziness and weakness, sleeping more than usual.  3 day hx of dark stool noted with red blood with wiping.  Denies any new pain sx.  Hx of back pain at a "7" which is chronic for her.  Hx of diarrhea since 1984 progressively worsening since cholecystectomy.

## 2019-12-31 NOTE — ED Notes (Signed)
Pt. Stated she is leaving AMA. MD at beside.

## 2019-12-31 NOTE — ED Notes (Signed)
Pt adamantly refuses Covid swab

## 2020-01-01 ENCOUNTER — Other Ambulatory Visit (HOSPITAL_COMMUNITY): Payer: Self-pay

## 2020-01-01 ENCOUNTER — Telehealth: Payer: Self-pay | Admitting: Cardiology

## 2020-01-01 ENCOUNTER — Other Ambulatory Visit: Payer: Self-pay | Admitting: Cardiology

## 2020-01-01 DIAGNOSIS — I951 Orthostatic hypotension: Secondary | ICD-10-CM

## 2020-01-01 DIAGNOSIS — N179 Acute kidney failure, unspecified: Secondary | ICD-10-CM

## 2020-01-01 DIAGNOSIS — D649 Anemia, unspecified: Secondary | ICD-10-CM

## 2020-01-01 NOTE — Telephone Encounter (Signed)
Patient sent to the ED on 12/31/2019 by me due to orthostatic hypotension and concern for possible acute blood loss anemia.  Work-up in the ED showed hemoglobin of 11, slightly lower than hemoglobin 2 days before (12 g/dl).  Notably, creatinine had increased to 2.2.  Pending further work-up, patient left AMA.  I called the patient at this time, left voicemail.  Given her unaddressed issue of orthostatic hypotension, and acute kidney injury, and possibility of ongoing blood loss anemia, I do not recommend proceeding with complex coronary intervention as scheduled on 01/05/2020.  I will arrange for outpatient follow-up in the coming days.  Nigel Mormon, MD [facility] Pager: [phone] Office: [phone]

## 2020-01-05 ENCOUNTER — Ambulatory Visit (HOSPITAL_COMMUNITY): Admission: RE | Admit: 2020-01-05 | Payer: Medicare HMO | Source: Home / Self Care | Admitting: Cardiology

## 2020-01-05 ENCOUNTER — Encounter (HOSPITAL_COMMUNITY): Admission: RE | Payer: Self-pay | Source: Home / Self Care

## 2020-01-05 SURGERY — CORONARY STENT INTERVENTION
Anesthesia: LOCAL

## 2020-01-09 ENCOUNTER — Encounter (HOSPITAL_COMMUNITY): Payer: Self-pay

## 2020-01-09 ENCOUNTER — Other Ambulatory Visit: Payer: Self-pay

## 2020-01-09 ENCOUNTER — Telehealth: Payer: Self-pay | Admitting: Cardiology

## 2020-01-09 ENCOUNTER — Emergency Department (HOSPITAL_COMMUNITY): Payer: Medicare Other

## 2020-01-09 ENCOUNTER — Encounter: Payer: Self-pay | Admitting: Cardiology

## 2020-01-09 ENCOUNTER — Inpatient Hospital Stay (HOSPITAL_COMMUNITY)
Admission: EM | Admit: 2020-01-09 | Discharge: 2020-01-11 | DRG: 641 | Disposition: A | Discharge status: Home / Self Care | Payer: Medicare Other | Attending: Internal Medicine | Admitting: Internal Medicine

## 2020-01-09 DIAGNOSIS — D631 Anemia in chronic kidney disease: Secondary | ICD-10-CM | POA: Diagnosis present

## 2020-01-09 DIAGNOSIS — I252 Old myocardial infarction: Secondary | ICD-10-CM | POA: Diagnosis not present

## 2020-01-09 DIAGNOSIS — I2510 Atherosclerotic heart disease of native coronary artery without angina pectoris: Secondary | ICD-10-CM | POA: Diagnosis present

## 2020-01-09 DIAGNOSIS — Z91040 Latex allergy status: Secondary | ICD-10-CM

## 2020-01-09 DIAGNOSIS — J441 Chronic obstructive pulmonary disease with (acute) exacerbation: Secondary | ICD-10-CM | POA: Diagnosis present

## 2020-01-09 DIAGNOSIS — Z86718 Personal history of other venous thrombosis and embolism: Secondary | ICD-10-CM

## 2020-01-09 DIAGNOSIS — Z86010 Personal history of colonic polyps: Secondary | ICD-10-CM

## 2020-01-09 DIAGNOSIS — R072 Precordial pain: Secondary | ICD-10-CM | POA: Diagnosis not present

## 2020-01-09 DIAGNOSIS — E875 Hyperkalemia: Secondary | ICD-10-CM | POA: Diagnosis not present

## 2020-01-09 DIAGNOSIS — Z800 Family history of malignant neoplasm of digestive organs: Secondary | ICD-10-CM | POA: Diagnosis not present

## 2020-01-09 DIAGNOSIS — I130 Hypertensive heart and chronic kidney disease with heart failure and stage 1 through stage 4 chronic kidney disease, or unspecified chronic kidney disease: Secondary | ICD-10-CM | POA: Diagnosis present

## 2020-01-09 DIAGNOSIS — Z79899 Other long term (current) drug therapy: Secondary | ICD-10-CM | POA: Diagnosis not present

## 2020-01-09 DIAGNOSIS — Z91048 Other nonmedicinal substance allergy status: Secondary | ICD-10-CM

## 2020-01-09 DIAGNOSIS — E782 Mixed hyperlipidemia: Secondary | ICD-10-CM | POA: Diagnosis present

## 2020-01-09 DIAGNOSIS — Z79891 Long term (current) use of opiate analgesic: Secondary | ICD-10-CM

## 2020-01-09 DIAGNOSIS — E119 Type 2 diabetes mellitus without complications: Secondary | ICD-10-CM

## 2020-01-09 DIAGNOSIS — Z89111 Acquired absence of right hand: Secondary | ICD-10-CM

## 2020-01-09 DIAGNOSIS — J440 Chronic obstructive pulmonary disease with acute lower respiratory infection: Secondary | ICD-10-CM | POA: Diagnosis present

## 2020-01-09 DIAGNOSIS — Z818 Family history of other mental and behavioral disorders: Secondary | ICD-10-CM

## 2020-01-09 DIAGNOSIS — J449 Chronic obstructive pulmonary disease, unspecified: Secondary | ICD-10-CM | POA: Diagnosis present

## 2020-01-09 DIAGNOSIS — Z794 Long term (current) use of insulin: Secondary | ICD-10-CM | POA: Diagnosis not present

## 2020-01-09 DIAGNOSIS — R079 Chest pain, unspecified: Secondary | ICD-10-CM | POA: Diagnosis present

## 2020-01-09 DIAGNOSIS — E1022 Type 1 diabetes mellitus with diabetic chronic kidney disease: Secondary | ICD-10-CM | POA: Diagnosis not present

## 2020-01-09 DIAGNOSIS — Z20822 Contact with and (suspected) exposure to covid-19: Secondary | ICD-10-CM | POA: Diagnosis not present

## 2020-01-09 DIAGNOSIS — I200 Unstable angina: Secondary | ICD-10-CM

## 2020-01-09 DIAGNOSIS — N1831 Chronic kidney disease, stage 3a: Secondary | ICD-10-CM

## 2020-01-09 DIAGNOSIS — I5032 Chronic diastolic (congestive) heart failure: Secondary | ICD-10-CM | POA: Diagnosis not present

## 2020-01-09 DIAGNOSIS — N179 Acute kidney failure, unspecified: Secondary | ICD-10-CM | POA: Diagnosis not present

## 2020-01-09 DIAGNOSIS — I2582 Chronic total occlusion of coronary artery: Secondary | ICD-10-CM | POA: Diagnosis not present

## 2020-01-09 DIAGNOSIS — I25110 Atherosclerotic heart disease of native coronary artery with unstable angina pectoris: Secondary | ICD-10-CM | POA: Diagnosis not present

## 2020-01-09 DIAGNOSIS — I25118 Atherosclerotic heart disease of native coronary artery with other forms of angina pectoris: Secondary | ICD-10-CM

## 2020-01-09 DIAGNOSIS — Z7902 Long term (current) use of antithrombotics/antiplatelets: Secondary | ICD-10-CM

## 2020-01-09 DIAGNOSIS — R197 Diarrhea, unspecified: Secondary | ICD-10-CM | POA: Diagnosis present

## 2020-01-09 DIAGNOSIS — N183 Chronic kidney disease, stage 3 unspecified: Secondary | ICD-10-CM | POA: Diagnosis present

## 2020-01-09 DIAGNOSIS — E039 Hypothyroidism, unspecified: Secondary | ICD-10-CM | POA: Diagnosis not present

## 2020-01-09 DIAGNOSIS — K219 Gastro-esophageal reflux disease without esophagitis: Secondary | ICD-10-CM | POA: Diagnosis not present

## 2020-01-09 DIAGNOSIS — R918 Other nonspecific abnormal finding of lung field: Secondary | ICD-10-CM | POA: Diagnosis present

## 2020-01-09 DIAGNOSIS — Z888 Allergy status to other drugs, medicaments and biological substances status: Secondary | ICD-10-CM

## 2020-01-09 DIAGNOSIS — F329 Major depressive disorder, single episode, unspecified: Secondary | ICD-10-CM | POA: Diagnosis present

## 2020-01-09 DIAGNOSIS — Z87891 Personal history of nicotine dependence: Secondary | ICD-10-CM

## 2020-01-09 DIAGNOSIS — Z880 Allergy status to penicillin: Secondary | ICD-10-CM

## 2020-01-09 LAB — BASIC METABOLIC PANEL
Anion gap: 8 (ref 5–15)
Anion gap: 6 (ref 5–15)
Anion gap: 7 (ref 5–15)
BUN/Creatinine Ratio: 18 (ref 12–28)
BUN: 26 mg/dL (ref 8–27)
BUN: 36 mg/dL — ABNORMAL HIGH (ref 8–23)
BUN: 37 mg/dL — ABNORMAL HIGH (ref 8–23)
BUN: 34 mg/dL — ABNORMAL HIGH (ref 8–23)
CO2: 21 mmol/L (ref 20–29)
CO2: 22 mmol/L (ref 22–32)
CO2: 21 mmol/L — ABNORMAL LOW (ref 22–32)
CO2: 24 mmol/L (ref 22–32)
Calcium: 10 mg/dL (ref 8.7–10.3)
Calcium: 9.3 mg/dL (ref 8.9–10.3)
Calcium: 9.6 mg/dL (ref 8.9–10.3)
Calcium: 9.2 mg/dL (ref 8.9–10.3)
Chloride: 99 mmol/L (ref 96–106)
Chloride: 102 mmol/L (ref 98–111)
Chloride: 105 mmol/L (ref 98–111)
Chloride: 103 mmol/L (ref 98–111)
Creatinine, Ser: 1.48 mg/dL — ABNORMAL HIGH (ref 0.57–1.00)
Creatinine, Ser: 2.19 mg/dL — ABNORMAL HIGH (ref 0.44–1.00)
Creatinine, Ser: 2.17 mg/dL — ABNORMAL HIGH (ref 0.44–1.00)
Creatinine, Ser: 2.06 mg/dL — ABNORMAL HIGH (ref 0.44–1.00)
GFR calc Af Amer: 42 mL/min/{1.73_m2} — ABNORMAL LOW (ref >59)
GFR calc Af Amer: 26 mL/min — ABNORMAL LOW (ref >60)
GFR calc Af Amer: 26 mL/min — ABNORMAL LOW (ref >60)
GFR calc Af Amer: 28 mL/min — ABNORMAL LOW (ref >60)
GFR calc non Af Amer: 36 mL/min/{1.73_m2} — ABNORMAL LOW (ref >59)
GFR calc non Af Amer: 22 mL/min — ABNORMAL LOW (ref >60)
GFR calc non Af Amer: 23 mL/min — ABNORMAL LOW (ref >60)
GFR calc non Af Amer: 24 mL/min — ABNORMAL LOW (ref >60)
Glucose, Bld: 284 mg/dL — ABNORMAL HIGH (ref 70–99)
Glucose, Bld: 216 mg/dL — ABNORMAL HIGH (ref 70–99)
Glucose, Bld: 203 mg/dL — ABNORMAL HIGH (ref 70–99)
Glucose: 198 mg/dL — ABNORMAL HIGH (ref 65–99)
Potassium: 6.7 mmol/L (ref 3.5–5.2)
Potassium: 7.3 mmol/L (ref 3.5–5.1)
Potassium: 7.5 mmol/L (ref 3.5–5.1)
Potassium: 5.8 mmol/L — ABNORMAL HIGH (ref 3.5–5.1)
Sodium: 134 mmol/L (ref 134–144)
Sodium: 132 mmol/L — ABNORMAL LOW (ref 135–145)
Sodium: 132 mmol/L — ABNORMAL LOW (ref 135–145)
Sodium: 134 mmol/L — ABNORMAL LOW (ref 135–145)

## 2020-01-09 LAB — CBC
HCT: 36.1 % (ref 36.0–46.0)
Hematocrit: 34.8 % (ref 34.0–46.6)
Hemoglobin: 11.2 g/dL (ref 11.1–15.9)
Hemoglobin: 10.7 g/dL — ABNORMAL LOW (ref 12.0–15.0)
MCH: 28.1 pg (ref 26.6–33.0)
MCH: 28.5 pg (ref 26.0–34.0)
MCHC: 32.2 g/dL (ref 31.5–35.7)
MCHC: 29.6 g/dL — ABNORMAL LOW (ref 30.0–36.0)
MCV: 87 fL (ref 79–97)
MCV: 96 fL (ref 80.0–100.0)
Platelets: 224 10*3/uL (ref 150–450)
Platelets: 207 10*3/uL (ref 150–400)
RBC: 3.99 x10E6/uL (ref 3.77–5.28)
RBC: 3.76 MIL/uL — ABNORMAL LOW (ref 3.87–5.11)
RDW: 19.4 % — ABNORMAL HIGH (ref 11.7–15.4)
RDW: 21.3 % — ABNORMAL HIGH (ref 11.5–15.5)
WBC: 6 10*3/uL (ref 3.4–10.8)
WBC: 6.1 10*3/uL (ref 4.0–10.5)
nRBC: 0 % (ref 0.0–0.2)

## 2020-01-09 LAB — RESPIRATORY PANEL BY RT PCR (FLU A&B, COVID)
Influenza A by PCR: NEGATIVE
Influenza B by PCR: NEGATIVE
SARS Coronavirus 2 by RT PCR: NEGATIVE

## 2020-01-09 LAB — TROPONIN I (HIGH SENSITIVITY)
Troponin I (High Sensitivity): 5 ng/L (ref <18)
Troponin I (High Sensitivity): 6 ng/L (ref <18)

## 2020-01-09 LAB — CBG MONITORING, ED: Glucose-Capillary: 213 mg/dL — ABNORMAL HIGH (ref 70–99)

## 2020-01-09 MED ORDER — CALCIUM GLUCONATE 10 % IV SOLN
1.0000 g | Freq: Once | INTRAVENOUS | Status: AC
  Administered 2020-01-09: 1 g via INTRAVENOUS
  Filled 2020-01-09: qty 10

## 2020-01-09 MED ORDER — HEPARIN SODIUM (PORCINE) 5000 UNIT/ML IJ SOLN
5000.0000 [IU] | Freq: Three times a day (TID) | INTRAMUSCULAR | Status: DC
  Administered 2020-01-09 – 2020-01-11 (×5): 5000 [IU] via SUBCUTANEOUS
  Filled 2020-01-09 (×5): qty 1

## 2020-01-09 MED ORDER — ONDANSETRON HCL 4 MG/2ML IJ SOLN: 4.0000 mg | Freq: Four times a day (QID) | INTRAMUSCULAR | Status: DC | PRN

## 2020-01-09 MED ORDER — PANTOPRAZOLE SODIUM 40 MG PO TBEC
40.0000 mg | DELAYED_RELEASE_TABLET | Freq: Every day | ORAL | Status: DC
  Administered 2020-01-10 – 2020-01-11 (×2): 40 mg via ORAL
  Filled 2020-01-09 (×2): qty 1

## 2020-01-09 MED ORDER — ALBUTEROL SULFATE HFA 108 (90 BASE) MCG/ACT IN AERS
2.0000 | INHALATION_SPRAY | Freq: Once | RESPIRATORY_TRACT | Status: AC
  Administered 2020-01-09: 13:00:00 2 via RESPIRATORY_TRACT
  Filled 2020-01-09: qty 6.7

## 2020-01-09 MED ORDER — POLYETHYLENE GLYCOL 3350 17 G PO PACK: 17.0000 g | PACK | Freq: Every day | ORAL | Status: DC | PRN

## 2020-01-09 MED ORDER — ADULT MULTIVITAMIN W/MINERALS CH
1.0000 | ORAL_TABLET | Freq: Every day | ORAL | Status: DC
  Administered 2020-01-10 – 2020-01-11 (×2): 1 via ORAL
  Filled 2020-01-09 (×2): qty 1

## 2020-01-09 MED ORDER — INSULIN ASPART 100 UNIT/ML IV SOLN
10.0000 [IU] | Freq: Once | INTRAVENOUS | Status: AC
  Administered 2020-01-09: 10 [IU] via INTRAVENOUS

## 2020-01-09 MED ORDER — SODIUM ZIRCONIUM CYCLOSILICATE 5 G PO PACK
10.0000 g | PACK | Freq: Once | ORAL | Status: AC
  Administered 2020-01-09: 10 g via ORAL
  Filled 2020-01-09: qty 2

## 2020-01-09 MED ORDER — OXYCODONE-ACETAMINOPHEN 5-325 MG PO TABS: 1.0000 | ORAL_TABLET | ORAL | Status: DC | PRN

## 2020-01-09 MED ORDER — SODIUM BICARBONATE 8.4 % IV SOLN
INTRAVENOUS | Status: DC
  Filled 2020-01-09 (×2): qty 1000

## 2020-01-09 MED ORDER — SODIUM CHLORIDE 0.9% FLUSH
3.0000 mL | INTRAVENOUS | Status: DC | PRN
  Administered 2020-01-09: 3 mL via INTRAVENOUS

## 2020-01-09 MED ORDER — SODIUM POLYSTYRENE SULFONATE 15 GM/60ML PO SUSP
30.0000 g | Freq: Once | ORAL | Status: AC
  Administered 2020-01-09: 30 g via ORAL
  Filled 2020-01-09: qty 120

## 2020-01-09 MED ORDER — INSULIN GLARGINE 100 UNIT/ML ~~LOC~~ SOLN
30.0000 [IU] | Freq: Every day | SUBCUTANEOUS | Status: DC
  Administered 2020-01-09 – 2020-01-11 (×3): 30 [IU] via SUBCUTANEOUS
  Filled 2020-01-09 (×3): qty 0.3

## 2020-01-09 MED ORDER — FUROSEMIDE 40 MG PO TABS: 40.0000 mg | ORAL_TABLET | Freq: Two times a day (BID) | ORAL | Status: DC

## 2020-01-09 MED ORDER — SODIUM CHLORIDE 0.9 % IV SOLN: 250.0000 mL | INTRAVENOUS | Status: DC | PRN

## 2020-01-09 MED ORDER — ALBUTEROL SULFATE HFA 108 (90 BASE) MCG/ACT IN AERS
2.0000 | INHALATION_SPRAY | Freq: Once | RESPIRATORY_TRACT | Status: AC
  Administered 2020-01-09: 2 via RESPIRATORY_TRACT
  Filled 2020-01-09: qty 6.7

## 2020-01-09 MED ORDER — SODIUM BICARBONATE 8.4 % IV SOLN: Freq: Once | INTRAVENOUS | Status: DC

## 2020-01-09 MED ORDER — ACETAMINOPHEN 650 MG RE SUPP: 650.0000 mg | Freq: Four times a day (QID) | RECTAL | Status: DC | PRN

## 2020-01-09 MED ORDER — LORAZEPAM 1 MG PO TABS: 1.0000 mg | ORAL_TABLET | Freq: Two times a day (BID) | ORAL | Status: DC | PRN

## 2020-01-09 MED ORDER — LEVOTHYROXINE SODIUM 25 MCG PO TABS
125.0000 ug | ORAL_TABLET | Freq: Every day | ORAL | Status: DC
  Administered 2020-01-10 – 2020-01-11 (×2): 125 ug via ORAL
  Filled 2020-01-09 (×2): qty 1

## 2020-01-09 MED ORDER — SODIUM BICARBONATE-DEXTROSE 150-5 MEQ/L-% IV SOLN
150.0000 meq | Freq: Once | INTRAVENOUS | Status: AC
  Administered 2020-01-09: 150 meq via INTRAVENOUS
  Filled 2020-01-09: qty 1000

## 2020-01-09 MED ORDER — ONDANSETRON HCL 4 MG PO TABS: 4.0000 mg | ORAL_TABLET | Freq: Four times a day (QID) | ORAL | Status: DC | PRN

## 2020-01-09 MED ORDER — SODIUM CHLORIDE 0.9% FLUSH
3.0000 mL | Freq: Two times a day (BID) | INTRAVENOUS | Status: DC
  Administered 2020-01-09 – 2020-01-11 (×3): 3 mL via INTRAVENOUS

## 2020-01-09 MED ORDER — TRAZODONE HCL 100 MG PO TABS
100.0000 mg | ORAL_TABLET | Freq: Every day | ORAL | Status: DC
  Administered 2020-01-09 – 2020-01-10 (×2): 100 mg via ORAL
  Filled 2020-01-09 (×2): qty 1

## 2020-01-09 MED ORDER — ALBUTEROL SULFATE (2.5 MG/3ML) 0.083% IN NEBU: 2.5000 mg | INHALATION_SOLUTION | RESPIRATORY_TRACT | Status: DC | PRN

## 2020-01-09 MED ORDER — CLOPIDOGREL BISULFATE 75 MG PO TABS
75.0000 mg | ORAL_TABLET | Freq: Every day | ORAL | Status: DC
  Administered 2020-01-10 – 2020-01-11 (×2): 75 mg via ORAL
  Filled 2020-01-09 (×2): qty 1

## 2020-01-09 MED ORDER — SODIUM BICARBONATE 8.4 % IV SOLN
INTRAVENOUS | Status: DC
  Filled 2020-01-09 (×2): qty 850

## 2020-01-09 MED ORDER — PREGABALIN 75 MG PO CAPS
75.0000 mg | ORAL_CAPSULE | Freq: Every day | ORAL | Status: DC
  Administered 2020-01-10 – 2020-01-11 (×2): 75 mg via ORAL
  Filled 2020-01-09 (×2): qty 1

## 2020-01-09 MED ORDER — SODIUM BICARBONATE-DEXTROSE 150-5 MEQ/L-% IV SOLN
150.0000 meq | INTRAVENOUS | Status: DC
  Filled 2020-01-09 (×3): qty 1000

## 2020-01-09 MED ORDER — ACETAMINOPHEN 325 MG PO TABS: 650.0000 mg | ORAL_TABLET | Freq: Four times a day (QID) | ORAL | Status: DC | PRN

## 2020-01-09 MED ORDER — PRAVASTATIN SODIUM 40 MG PO TABS
40.0000 mg | ORAL_TABLET | Freq: Every day | ORAL | Status: DC
  Administered 2020-01-10 – 2020-01-11 (×2): 40 mg via ORAL
  Filled 2020-01-09 (×2): qty 1

## 2020-01-09 MED ORDER — PAROXETINE HCL 20 MG PO TABS
20.0000 mg | ORAL_TABLET | Freq: Every day | ORAL | Status: DC
  Administered 2020-01-10 – 2020-01-11 (×2): 20 mg via ORAL
  Filled 2020-01-09 (×2): qty 1

## 2020-01-09 MED ORDER — FERROUS SULFATE 325 (65 FE) MG PO TABS
325.0000 mg | ORAL_TABLET | Freq: Every day | ORAL | Status: DC
  Administered 2020-01-10 – 2020-01-11 (×2): 325 mg via ORAL
  Filled 2020-01-09 (×2): qty 1

## 2020-01-09 MED ORDER — SODIUM CHLORIDE 0.9% FLUSH: 3.0000 mL | Freq: Once | INTRAVENOUS | Status: DC

## 2020-01-09 MED ORDER — ALBUTEROL SULFATE HFA 108 (90 BASE) MCG/ACT IN AERS: 2.0000 | INHALATION_SPRAY | RESPIRATORY_TRACT | Status: DC | PRN

## 2020-01-09 NOTE — Progress Notes (Signed)
  Patient reevaluated found to have persistent hyperkalemia despite aggressive measures including hyperkalemia cocktail  -- Patient received hyperkalemia cocktail including bicarb infusion, calcium gluconate, albuterol, iv insulin, Lokelma and Kayexalate --Repeat potassium up to 7.5, will repeat albuterol, will give bicarb infusion, repeat IV insulin and repeat Lokelma  -Check BMP again in a couple of hours if stable then every 4 hours  CRITICAL CARE Performed by: Roxan Hockey   Total critical care time: 43 minutes  --Due to persistent life-threatening hyperkalemia  Critical care time was exclusive of separately billable procedures and treating other patients.  Critical care was necessary to treat or prevent imminent or life-threatening deterioration.  Critical care was time spent personally by me on the following activities: development of treatment plan with patient and/or surrogate as well as nursing, discussions with consultants, evaluation of patient's response to treatment, examination of patient, obtaining history from patient or surrogate, ordering and performing treatments and interventions, ordering and review of laboratory studies, ordering and review of radiographic studies, pulse oximetry and re-evaluation of patient's condition.

## 2020-01-09 NOTE — ED Provider Notes (Addendum)
[facility] Provider Note   CSN: [id number] Arrival date & time: 01/09/20  1038     History Chief Complaint  Patient presents with  . Chest Pain    [name] is a 69 y.o. female.  HPI 69 year old female with extensive medical history including non-ST elevation MI with RCA occlusion currently on Plavix, COPD, DM type II, hyperlipidemia, CKD, CHF with preserved EF presents the ER today after receiving a call from her cardiologist stating that her potassium was 6.7 and she needed to go to the ER for further evaluation.  On 12/19/19 she was admitted for a non-ST elevation MI where she underwent a cardiac cath which found a proximal RCA 95% stenosis.  She was also transfused with a unit of PRBCs for severe iron deficiency with low iron and ferritin.  She was then discharged on 12/24/19 with cardiology planning for intervention as an outpatient and started on Plavix.  She was seen again in the ER on 12/31/2019 for possible GI bleed/worsening renal function awaiting intervention for her RCA occlusion.  Due to worsening renal failure and concern for decreased hemoglobin she was not a candidate for a stent at that time.  She was scheduled to get admitted, but left AMA due to refusing a Covid test per notes. She was given strict return precautions. Today she presents with worsening chest pain at rest, stating that normally she is at about a 3/4 but today it is about a 8/9 and a potassium of 6.7 per lab work done at her cardiologist's office yesterday.  Denies radiation of pain to shoulder, syncope, tearing chest pain, nausea, vomiting, shoulder pain but does endorse increasing shortness of breath even at rest.  Past Medical History:  Diagnosis Date  . Allergic rhinitis   . 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 ASCVD (arteriosclerotic cardiovascular disease)    MI in 96 requiring BMS CX; DES to M1 in 2000;normal coronary angiography in 2004  . Cholelithiasis   . COPD  (chronic obstructive pulmonary disease) ([place])   . DDD (degenerative disc disease), lumbar   . Depression   . Diabetes mellitus   . Diabetes mellitus type I ([place])   . DVT (deep venous thrombosis) ([place])   . GERD (gastroesophageal reflux disease)   . Hyperlipidemia   . Hypothyroidism   . Low back pain   . Nephrolithiasis 2006   stone extraction   . Peripheral neuropathy   . Pneumonia 12/22/2019  . Sciatic pain    right  . Tobacco abuse   . Tremor     Patient Active Problem List   Diagnosis Date Noted  . CKD (chronic kidney disease), stage IIIa 01/09/2020  . Mass of middle lobe of right lung--??? PNA 01/09/2020  . Orthostatic hypotension 12/31/2019  . Non-ST elevation (NSTEMI) myocardial infarction ([place])   . Anemia   . Dyspnea   . Hx of heart artery stent   . Benign hypertension with CKD (chronic kidney disease) stage III   . Former smoker   . Chest pain 12/20/2019  . Weakness 09/04/2014  . Dizziness 09/04/2014  . Hepatomegaly 03/31/2014  . Esophageal dysphagia 03/31/2014  . Unspecified constipation 03/31/2014  . AKI (acute kidney injury) ([place]) 10/30/2013  . Altered mental status 09/28/2013  . ETOH abuse 09/28/2013  . Altered mental state 09/28/2013  . Hyperkalemia 06/05/2013  . Bradycardia 06/05/2013  . Edema 06/02/2013  . Hyponatremia 06/02/2013  . Pneumonia due to infectious agent 12/26/2012  .  Insomnia due to mental disorder 09/02/2012  . RECTAL PAIN 09/04/2010  . CHEST PAIN 08/22/2010  . Insulin dependent type 2 diabetes mellitus ([place]) 06/16/2010  . Coronary artery disease/Prior Stents 1997 and 2000/RCA occlusion 12/2019 06/16/2010  . MICROALBUMINURIA 06/01/2009  . 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 OTHER DYSPHAGIA 01/05/2009  . COLONIC POLYPS, ADENOMATOUS, HX OF 01/05/2009  . SCHATZKI'S RING, HX OF 01/05/2009  . BACK PAIN 08/04/2008  . DIARRHEA, CHRONIC 08/04/2008  . TOBACCO ABUSE 06/03/2007  . IBS 06/03/2007  . NEPHROLITHIASIS 02/04/2007  . ANXIETY  STATE NOS 11/26/2006  . Hypothyroidism 11/01/2006  . Mixed hyperlipidemia 11/01/2006  . Depression 11/01/2006  . PERIPHERAL NEUROPATHY 11/01/2006  . CATARACT NOS 11/01/2006  . ALLERGIC RHINITIS 11/01/2006  . COPD (chronic obstructive pulmonary disease) ([place]) 11/01/2006  . GERD 11/01/2006  . DEGENERATION, DISC NOS 11/01/2006  . LOW BACK PAIN 11/01/2006  . DVT, HX OF 11/01/2006    Past Surgical History:  Procedure Laterality Date  . BACK SURGERY    . CHOLECYSTECTOMY    . COLONOSCOPY  01/2009   KAJ:GOTLXB rectum/repeat in 5 yrs  . COLONOSCOPY N/A 04/29/2014   Dr.Rourk- attempted/incomplete colonoscopy. inadequate prep  . COLONOSCOPY N/A 05/27/2014   WIO:MBTDHRCBU coli. Colonic polyps-removed as described above.Status post segmental biopsy  . COLONOSCOPY WITH PROPOFOL N/A 12/23/2019   Procedure: COLONOSCOPY WITH PROPOFOL;  Surgeon: Ronnette Juniper, MD;  Location: [facility];  Service: Gastroenterology;  Laterality: N/A;  . DILATION AND CURETTAGE OF UTERUS  1974  . ESOPHAGOGASTRODUODENOSCOPY  05/2010   Dr. Tessie Fass, erosion. 71F dilation  . ESOPHAGOGASTRODUODENOSCOPY N/A 04/29/2014   Dr.Rourk- normal esophagus s/p passage of maloney dilator. small hiatal hernia- bx= chronic inflammation.  . ESOPHAGOGASTRODUODENOSCOPY (EGD) WITH PROPOFOL N/A 12/23/2019   Procedure: ESOPHAGOGASTRODUODENOSCOPY (EGD) WITH PROPOFOL;  Surgeon: Ronnette Juniper, MD;  Location: [facility];  Service: Gastroenterology;  Laterality: N/A;  . HAND AMPUTATION Right    traumatic  . HEMOSTASIS CLIP PLACEMENT  12/23/2019   Procedure: HEMOSTASIS CLIP PLACEMENT;  Surgeon: Ronnette Juniper, MD;  Location: [facility];  Service: Gastroenterology;;  . HOT HEMOSTASIS N/A 12/23/2019   Procedure: HOT HEMOSTASIS (ARGON PLASMA COAGULATION/BICAP);  Surgeon: Ronnette Juniper, MD;  Location: [facility];  Service: Gastroenterology;  Laterality: N/A;  . LEFT HEART CATH AND CORONARY ANGIOGRAPHY N/A 12/24/2019   Procedure: LEFT HEART CATH AND CORONARY  ANGIOGRAPHY;  Surgeon: Nigel Mormon, MD;  Location: [facility];  Service: Cardiovascular;  Laterality: N/A;  . Venia Minks DILATION N/A 04/29/2014   Procedure: Venia Minks DILATION;  Surgeon: Daneil Dolin, MD;  [facility];  Service: Endoscopy;  Laterality: N/A;  . PARTIAL HYSTERECTOMY  1978  . POLYPECTOMY  12/23/2019   Procedure: POLYPECTOMY;  Surgeon: Ronnette Juniper, MD;  Location[facility];  Service: Gastroenterology;;  . SHOULDER SURGERY     Left shoulder for RTC;left arm surgery '98/left hand surgery 2001  . TOTAL ABDOMINAL HYSTERECTOMY W/ BILATERAL SALPINGOOPHORECTOMY  2002  . UMBILICAL HERNIA REPAIR  2008     OB History    Gravida  3   Para  2   Term  2   Preterm      AB  1   Living        SAB  1   TAB      Ectopic      Multiple      Live Births              Family History  Problem Relation Age of Onset  .  Depression Mother   . Bipolar disorder Mother   . Dementia Mother   . Pulmonary fibrosis Mother   . Alcohol abuse Father   . Aneurysm Father        deceased age 44, brain  . Colon cancer Paternal Grandfather        age greater than 18    Social History   Tobacco Use  . Smoking status: Former Smoker    Packs/day: 0.50    Years: 40.00    Pack years: 20.00    Types: Cigarettes    Quit date: 08/30/2017    Years since quitting: 2.3  . Smokeless tobacco: Never Used  . Tobacco comment: 1/2 pack per day  Substance Use Topics  . Alcohol use: No  . Drug use: No    Home Medications Prior to Admission medications   Medication Sig Start Date End Date Taking? Authorizing Provider  albuterol (PROVENTIL HFA;VENTOLIN HFA) 108 (90 BASE) MCG/ACT inhaler Inhale 2 puffs into the lungs every 4 (four) hours as needed for wheezing or shortness of breath. 10/02/15  Yes Horton, Barbette Hair, MD  aspirin-acetaminophen-caffeine (EXCEDRIN MIGRAINE) [phone] MG tablet Take 2 tablets by mouth every 6 (six) hours as needed for headache.   Yes  [provider]  clopidogrel (PLAVIX) 75 MG tablet Take 1 tablet (75 mg total) by mouth daily. 12/25/19  Yes Shelly Coss, MD  ferrous sulfate 325 (65 FE) MG tablet Take 1 tablet (325 mg total) by mouth daily with breakfast. Patient taking differently: Take 325 mg by mouth daily at 12 noon.  12/24/19 12/23/20 Yes Shelly Coss, MD  furosemide (LASIX) 40 MG tablet Take 1 tablet (40 mg total) by mouth 2 (two) times daily. 12/25/19  Yes Shelly Coss, MD  LANTUS SOLOSTAR 100 UNIT/ML Solostar Pen Inject 40 Units into the skin daily.  10/19/19  Yes [provider]  levothyroxine (SYNTHROID) 125 MCG tablet Take 125 mcg by mouth daily. 12/15/19  Yes [provider]  lisinopril (PRINIVIL,ZESTRIL) 20 MG tablet Take 20 mg by mouth daily.     Yes [provider]  LORazepam (ATIVAN) 1 MG tablet Take 1 mg by mouth 2 (two) times daily as needed for anxiety.  10/21/19  Yes [provider]  metFORMIN (GLUCOPHAGE) 500 MG tablet Take 500 mg by mouth 2 (two) times daily with a meal.  09/18/19  Yes [provider]  Multiple Vitamins-Minerals (MULTIVITAMIN WITH MINERALS) tablet Take 1 tablet by mouth daily.   Yes [provider]  oxyCODONE-acetaminophen (PERCOCET/ROXICET) 5-325 MG tablet Take 1-2 tablets by mouth every 4 (four) hours as needed for severe pain. 09/20/15  Yes Nat Christen, MD  pantoprazole (PROTONIX) 40 MG tablet Take 40 mg by mouth daily. 09/29/19  Yes [provider]  PARoxetine (PAXIL) 20 MG tablet Take 20 mg by mouth daily.  09/29/19  Yes [provider]  potassium chloride 20 MEQ TBCR Take 40 mEq by mouth daily. Patient taking differently: Take 20 mEq by mouth daily.  12/24/19 01/23/20 Yes Shelly Coss, MD  pravastatin (PRAVACHOL) 40 MG tablet Take 40 mg by mouth daily.     Yes [provider]  pregabalin (LYRICA) 75 MG capsule Take 75 mg by mouth daily.  10/19/19  Yes [provider]  traZODone (DESYREL) 100  MG tablet Take 100 mg by mouth at bedtime.  08/19/15  Yes [provider]    Allergies    Iohexol, Tape, Ciprofloxacin, Latex, Penicillins, and Povidone-iodine  Review of Systems  Review of Systems  Constitutional: Negative for chills, fatigue and fever.  HENT: Negative for ear pain and sore throat.   Eyes: Negative for pain and visual disturbance.  Respiratory: Positive for cough, chest tightness and shortness of breath.   Cardiovascular: Positive for chest pain. Negative for palpitations and leg swelling.  Gastrointestinal: Negative for abdominal pain, nausea and vomiting.  Genitourinary: Negative for dysuria and hematuria.  Musculoskeletal: Negative for arthralgias and back pain.  Skin: Negative for color change and rash.  Neurological: Negative for dizziness, seizures, syncope and headaches.  All other systems reviewed and are negative.   Physical Exam Updated Vital Signs BP (!) 99/48   Pulse 88   Temp 98.4 F (36.9 C) (Oral)   Resp 17   Ht 5' 6"  (1.676 m)   Wt 77.1 kg   SpO2 98%   BMI 27.44 kg/m   Physical Exam Vitals reviewed.  Constitutional:      General: She is not in acute distress.    Appearance: Normal appearance. She is obese. She is not ill-appearing, toxic-appearing or diaphoretic.  HENT:     Head: Normocephalic.  Cardiovascular:     Rate and Rhythm: Normal rate and regular rhythm.     Pulses: Normal pulses.     Heart sounds: Normal heart sounds, S1 normal and S2 normal. No murmur. No friction rub.  Pulmonary:     Effort: Pulmonary effort is normal.     Breath sounds: Wheezing present.  Chest:     Chest wall: No tenderness or crepitus.  Abdominal:     General: Abdomen is flat.     Palpations: Abdomen is soft.  Musculoskeletal:     Right lower leg: No edema.     Left lower leg: No edema.     Comments: Right hand amputation  Skin:    General: Skin is warm and dry.  Neurological:     Mental Status: She is alert.  Psychiatric:         Mood and Affect: Mood normal.        Behavior: Behavior normal.     ED Results / Procedures / Treatments   Labs (all labs ordered are listed, but only abnormal results are displayed) Labs Reviewed  BASIC METABOLIC PANEL - Abnormal; Notable for the following components:      Result Value   Sodium 132 (*)    Potassium 7.3 (*)    Glucose, Bld 284 (*)    BUN 36 (*)    Creatinine, Ser 2.19 (*)    GFR calc non Af Amer 22 (*)    GFR calc Af Amer 26 (*)    All other components within normal limits  CBC - Abnormal; Notable for the following components:   RBC 3.76 (*)    Hemoglobin 10.7 (*)    MCHC 29.6 (*)    RDW 21.3 (*)    All other components within normal limits  BASIC METABOLIC PANEL - Abnormal; Notable for the following components:   Sodium 132 (*)    Potassium 7.5 (*)    CO2 21 (*)    Glucose, Bld 216 (*)    BUN 37 (*)    Creatinine, Ser 2.17 (*)    GFR calc non Af Amer 23 (*)    GFR calc Af Amer 26 (*)    All other components within normal limits  CBG MONITORING, ED - Abnormal; Notable for the following components:   Glucose-Capillary 213 (*)    All other components within  normal limits  RESPIRATORY PANEL BY RT PCR (FLU A&B, COVID)  BASIC METABOLIC PANEL  TROPONIN I (HIGH SENSITIVITY)  TROPONIN I (HIGH SENSITIVITY)    EKG EKG Interpretation  Date/Time:  Saturday January 09 2020 10:54:49 EDT Ventricular Rate:  92 PR Interval:    QRS Duration: 95 QT Interval:  321 QTC Calculation: 397 R Axis:   26 Text Interpretation: Sinus rhythm Borderline repolarization abnormality difusely inverted T waves - more prominent than prior on 12/31/19 Confirmed by Noemi Chapel [phone]) on 01/09/2020 11:05:11 AM   Radiology DG Chest Portable 1 View  Result Date: 01/09/2020 CLINICAL DATA:  Pt reports was discharged approx 1 week ago. [facility] and was told she had a 100% blockage. Reports they weren't able to put in a stent. Persistent chest pain EXAM:  PORTABLE CHEST - 1 VIEW COMPARISON:  12/31/2019 FINDINGS: 4.1 cm masslike consolidation adjacent to the minor fissure as before. No new infiltrate or overt edema. Heart size and mediastinal contours are within normal limits. Aortic Atherosclerosis (ICD10-170.0). No effusion. No pneumothorax. Visualized bones unremarkable. IMPRESSION: Persistent masslike consolidation adjacent to the minor fissure. No acute findings. Electronically Signed   By: Lucrezia Europe M.D.   On: 01/09/2020 12:03    Procedures .Critical Care Performed by: Garald Balding, PA-C Authorized by: Garald Balding, PA-C   Critical care provider statement:    Critical care time (minutes):  45   Critical care time was exclusive of:  Separately billable procedures and treating other patients   Critical care was necessary to treat or prevent imminent or life-threatening deterioration of the following conditions:  Cardiac failure and renal failure   Critical care was time spent personally by me on the following activities:  Discussions with consultants, evaluation of patient's response to treatment, examination of patient, ordering and performing treatments and interventions, ordering and review of laboratory studies, ordering and review of radiographic studies, pulse oximetry, re-evaluation of patient's condition, obtaining history from patient or surrogate, review of old charts and interpretation of cardiac output measurements   I assumed direction of critical care for this patient from another provider in my specialty: no     (including critical care time)  Medications Ordered in ED Medications  sodium bicarbonate 150 mEq in dextrose 5% 1000 mL infusion (150 mEq Intravenous New Bag/Given 01/09/20 1301)  sodium zirconium cyclosilicate (LOKELMA) packet 10 g (has no administration in time range)  insulin aspart (novoLOG) injection 10 Units (has no administration in time range)  albuterol (VENTOLIN HFA) 108 (90 Base) MCG/ACT inhaler 2 puff  (has no administration in time range)  sodium bicarbonate 150 mEq in dextrose 5% 1000 mL infusion (has no administration in time range)  albuterol (VENTOLIN HFA) 108 (90 Base) MCG/ACT inhaler 2 puff (2 puffs Inhalation Given 01/09/20 1256)  calcium gluconate inj 10% (1 g) URGENT USE ONLY! (1 g Intravenous Given 01/09/20 1256)  insulin aspart (novoLOG) injection 10 Units (10 Units Intravenous Given 01/09/20 1257)  sodium zirconium cyclosilicate (LOKELMA) packet 10 g (10 g Oral Given 01/09/20 1322)  sodium polystyrene (KAYEXALATE) 15 GM/60ML suspension 30 g (30 g Oral Given 01/09/20 1404)    ED Course  I have reviewed the triage vital signs and the nursing notes.  Pertinent labs & imaging results that were available during my care of the patient were reviewed by me and considered in my medical decision making (see chart for details).    MDM Rules/Calculators/A&P  69 year old female with extensive cardiac history presents to ER with a potassium of 6.7 as of yesterday and worsening chest pain at rest. Appears in no acute distress, resting comfortably in room with no increased work of breathing. Diastolic blood pressure in the 50s but this appears to be consistent with her previous visits.  Normotensive, 99 O2 sats.  EKG shows more T wave inversions relative to prior EKG but negative troponin; will trend throughout ED course.  Chest x-ray unchanged from last visit with persistent masslike consolidation.  Critical potassium of 7.3 trending upwards from yesterday's 6.7, will initiate hyperkalemia protocol and consult cardiology to see if she requires [facility] transfer given her recent cardiac history.   Dr. Sabra Heck consulted  Dr.Tolia with [facility] cardiology and it was agreed upon that the patient will be admitted to [facility] via the hospital service and she will be followed by them there. Consulted [facility] hospitalist Dr.Emokpae and the patient will be admitted for further management of her  hyperkalemia and 95% occlusion of her RCA.  The patient was seen and evaluated by Dr. Sabra Heck, he agrees with the plan above. Final Clinical Impression(s) / ED Diagnoses Final diagnoses:  Hyperkalemia  AKI (acute kidney injury) [facility])    Rx / [place] Orders ED Discharge Orders    None       Garald Balding, PA-C 01/09/20 1555    Garald Balding, PA-C 01/09/20 1711    Garald Balding, PA-C 01/09/20 1713    Noemi Chapel, MD 01/10/20 [phone]

## 2020-01-09 NOTE — ED Provider Notes (Signed)
Medical screening examination/treatment/procedure(s) were conducted as a shared visit with non-physician practitioner(s) and myself.  I personally evaluated the patient during the encounter.  Clinical Impression:   Final diagnoses:  Hyperkalemia  AKI (acute kidney injury) ([place])  Unstable angina [facility])    Patient is a 69 year old female, history of coronary disease, recent admission to the hospital approximately 2 weeks ago when she was noted to have a non-ST elevation MI.  The patient had been treated at that time with a blood transfusion and started on Plavix, she was noted interventional candidate at that time though she had a 100% proximal RCA occlusion.  It was noted that she had left to right collaterals at that time.  She was to be evaluated on March 23 secondary to this proximal occlusion for an intervention however due to an acute kidney injury the patient was told that she was not a candidate at this time.  She left AGAINST MEDICAL ADVICE during that evaluation when she was sent to the emergency department on 18 March.  She had blood work done in the meantime and had a potassium that was measured at 6.7 yesterday, she was called this morning by cardiology and asked to come to the hospital for repeat evaluation.  He does report increasing amounts of chest pain over the week, blood pressure has been slightly low and she gets lightheaded and dizzy when she stands.  Currently blood pressure is 100/66.  We will touch base with cardiology, repeat labs.  Hyperkalemia was in fact discovered again here over 7, thankfully no severe EKG findings however the patient was given aggressive management for the potassium given the level of elevation.  Her feeling off balance and lightheaded and slightly ataxic.  This involved a bicarbonate drip, Lokelma, albuterol, insulin and D50 and admission.  I discussed the care with the cardiologist who recommends that the patient be transferred to [facility] on the  hospitalist service, hospitalist consulted and agreeable.   Noemi Chapel, MD 01/10/20 0700

## 2020-01-09 NOTE — Telephone Encounter (Signed)
ON-CALL CARDIOLOGY 01/09/20  Patient's name: [name].   MRN: [id number].    DOB: [birth year] Primary care provider: Lucia Gaskins, MD. Cardiologist: Dr. Jorge Ny regarding this patient's care today: [facility] called regarding critical lab [phone] and reference # [id number]) Potassium level is 6.7 which was confirmed by repeat testing.   Called the patient at [phone].  She answered the call and was informed that her potassium is 6.7 which critically high. She stated that her girlfriend is driving her to the ER at [facility] as we were talking.   She was thankful for the call back.    Rex Kras, DO, [facility]. [place] Office: [phone]

## 2020-01-09 NOTE — ED Notes (Signed)
Date and time results received: 01/08/2110:56 AM    Test: Potassium Critical Value: 7.3  Name of Provider Notified: Sharyn Lull  Orders Received? Or Actions Taken? See orders

## 2020-01-09 NOTE — ED Triage Notes (Signed)
Pt reports was discharged approx 1 week ago.  Reports [facility] and was told she had a 100% blockage.  Reports they weren't able to put in a stent.  Pt says was called today and told to come to ED because her potassium was too high.  Pt still reports chest pain in center of chest.  Says it doesn't radiate anywhere else.  Denies any nausea or sob.  Reports a cough but says has had it for " a while."

## 2020-01-09 NOTE — H&P (Signed)
Patient Demographics:    [name], is a 69 y.o. 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 PNA   Insulin dependent type 2 diabetes mellitus ([place])   Coronary artery disease/Prior Stents 1997 and 2000/RCA occlusion 12/2019   COPD (chronic obstructive pulmonary disease) ([place])   DIARRHEA, CHRONIC   CKD (chronic kidney disease), stage IIIa   1)Severe Hyperkalemia--- potassium was 6.7 on 01/08/2020, repeat potassium 7.3 on 01/09/2020 --Patient received hyperkalemia cocktail including bicarb infusion, calcium gluconate, albuterol, iv insulin, Lokelma and Kayexalate --Repeat potassium up to 7.5, will repeat albuterol, will give bicarb infusion, repeat IV insulin and repeat Lokelma -Will not repeat Kayexalate as patient has history of chronic diarrhea -PTA patient was on lisinopril 20 mg and potassium 24mq - -Stop lisinopril, stop potassium replacement -BMP every 4 hours -EKG without significant T wave peaking, patient actually has some T wave inversions  2)CAD--- status post bare-metal stent to left circumflex in the post MI state in 1997, status post DES to OM1 in 2000 --LHC on 12/24/2019 with total occlusion of proximal RCA--- awaiting angioplasty and stenting --Continue aspirin and Plavix --Cardiology to see   3)HFpEF--- patient with chronic diastolic dysfunction CHF, echo from March 2021 with EF of 55 to 60% with grade 2 diastolic dysfunction -Stop Lasix due to AKI --DC lisinopril due to hyperkalemia  4) chronic normocytic anemia with  concerns about recurrent GI blood loss--- recent work-up consistent with severe iron deficiency anemia with low iron and low ferritin  ---[facility] March 2021 [facility], AVM on the transverse colon, APC was applied -Patient was transfused PRBC -Hemoglobin currently 10.7 , discharge hemoglobin was around 11  5)AKI with hyperkalemia----acute kidney injury on CKD stage - IIIa- worsening renal failure due to Lasix use compounded by lisinopril and Metformin use - creatinine on admission= 2.19  , baseline creatinine = 1.48 (01/08/20) -- renally adjust medications, avoid nephrotoxic agents / dehydration  / hypotension -Hold lisinopril, hold Metformin, hold Lasix, hold potassium supplementation  6)Rt ML Mass-patient was recently treated for pneumonia, serial imaging studies suggest persistent masslike lesion on the right middle lobe----please consider inpatient pulmonology consult prior to discharge given persistence of this finding   7)DM2-A1c 8.15 December 2019, reflecting uncontrolled DM --Give Lantus insulin 30 units daily -,Use Novolog/Humalog Sliding scale insulin with Accu-Cheks/Fingersticks as ordered   8)Hypothyroidism--- continue levothyroxine, check TSH  9) COPD--- appears stable at this time with no exacerbation, continue bronchodilators especially in view of hyperkalemia  With History of - Reviewed by me  Past Medical History:  Diagnosis Date  . Allergic rhinitis   .  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 ASCVD (arteriosclerotic cardiovascular disease)    MI in 96 requiring BMS CX; DES to M1 in 2000;normal coronary angiography in 2004  . Cholelithiasis   . COPD (chronic obstructive pulmonary disease) ([place])   . DDD (degenerative disc disease), lumbar   . Depression   . Diabetes mellitus   . Diabetes mellitus type I ([place])   . DVT (deep venous thrombosis) ([place])   . GERD (gastroesophageal reflux disease)   .  Hyperlipidemia   . Hypothyroidism   . Low back pain   . Nephrolithiasis 2006   stone extraction   . Peripheral neuropathy   . Pneumonia 12/22/2019  . Sciatic pain    right  . Tobacco abuse   . Tremor      Past Surgical History:  Procedure Laterality Date  . BACK SURGERY    . CHOLECYSTECTOMY    . COLONOSCOPY  01/2009   UMP:NTIRWE rectum/repeat in 5 yrs  . COLONOSCOPY N/A 04/29/2014   Dr.Rourk- attempted/incomplete colonoscopy. inadequate prep  . COLONOSCOPY N/A 05/27/2014   RXV:[id number] coli. Colonic polyps-removed as described above.Status post segmental biopsy  . COLONOSCOPY WITH PROPOFOL N/A 12/23/2019   Procedure: COLONOSCOPY WITH PROPOFOL;  Surgeon: Ronnette Juniper, MD;  Location: [place];  Service: Gastroenterology;  Laterality: N/A;  . DILATION AND CURETTAGE OF UTERUS  1974  . ESOPHAGOGASTRODUODENOSCOPY  05/2010   Dr. Tessie Fass, erosion. 81F dilation  . ESOPHAGOGASTRODUODENOSCOPY N/A 04/29/2014   Dr.Rourk- normal esophagus s/p passage of maloney dilator. small hiatal hernia- bx= chronic inflammation.  . ESOPHAGOGASTRODUODENOSCOPY (EGD) WITH PROPOFOL N/A 12/23/2019   Procedure: ESOPHAGOGASTRODUODENOSCOPY (EGD) WITH PROPOFOL;  Surgeon: Ronnette Juniper, MD;  Location: [place];  Service: Gastroenterology;  Laterality: N/A;  . HAND AMPUTATION Right    traumatic  . HEMOSTASIS CLIP PLACEMENT  12/23/2019   Procedure: HEMOSTASIS CLIP PLACEMENT;  Surgeon: Ronnette Juniper, MD;  Location: [place];  Service: Gastroenterology;;  . HOT HEMOSTASIS N/A 12/23/2019   Procedure: HOT HEMOSTASIS (ARGON PLASMA COAGULATION/BICAP);  Surgeon: Ronnette Juniper, MD;  Location: [place];  Service: Gastroenterology;  Laterality: N/A;  . LEFT HEART CATH AND CORONARY ANGIOGRAPHY N/A 12/24/2019   Procedure: LEFT HEART CATH AND CORONARY ANGIOGRAPHY;  Surgeon: Nigel Mormon, MD;  Location: [place] [facility];  Service: Cardiovascular;  Laterality: N/A;  . Venia Minks DILATION N/A 04/29/2014   Procedure: Venia Minks  DILATION;  Surgeon: Daneil Dolin, MD;  [facility];  Service: Endoscopy;  Laterality: N/A;  . PARTIAL HYSTERECTOMY  1978  . POLYPECTOMY  12/23/2019   Procedure: POLYPECTOMY;  Surgeon: Ronnette Juniper, MD;  Location: [facility] ENDOSCOPY;  Service: Gastroenterology;;  . SHOULDER SURGERY     Left shoulder for RTC;left arm surgery '98/left hand surgery 2001  . TOTAL ABDOMINAL HYSTERECTOMY W/ BILATERAL SALPINGOOPHORECTOMY  2002  . UMBILICAL HERNIA REPAIR  2008   Chief Complaint  Patient presents with  . Chest Pain     HPI:    [name]  is a 69 y.o. female reformed smoker reformed drinker with history of COPD, diabetes type 2, hyperlipidemia, hypothyroidism, prior history of DVT not on anticoagulation, anxiety, depression , history of CAD with prior bare-metal stent to left circumflex post MI 1997, DES secondary to M1 branch in 2000, LHC on 12/24/2018 with total occlusion of proximal RCA awaiting angioplasty and stent placement, recent treatment for right middle lobe mass/pneumonia--- presents to the ED today after being called to come to ED bt her cardiologist---  had called  her to come to the ED due to blood work revealing hyperkalemia, repeat BMP in the ED consistent with hyperkalemia in the 7 range  --Patient has chronic diarrhea continues to have diarrhea -No emesis, no palpitations no dizziness no leg pains no leg swelling  PTA patient was on lisinopril 20 mg and potassium 45mq -  -Potassium on 01/08/2020 was 6.7, repeat potassium 7.3, patient received hyperkalemia cocktail in the ED repeat potassium went up to 7.5 -Hyperkalemia cocktail and bicarb repeated --EKG without  "peaked"  T waves  Creatinine is up to 2.19 from 1.46 on 01/08/20  -[facility]    Review of systems:    In addition to the HPI above,   A full Review of  Systems was done, all other systems reviewed are negative except as noted above in HPI  , .   Social History:  Reviewed by me    Social History   Tobacco Use  . Smoking status: Former Smoker    Packs/day: 0.50    Years: 40.00    Pack years: 20.00    Types: Cigarettes    Quit date: 08/30/2017    Years since quitting: 2.3  . Smokeless tobacco: Never Used  . Tobacco comment: 1/2 pack per day  Substance Use Topics  . Alcohol use: No    Family History :  Reviewed by me   Family History  Problem Relation Age of Onset  . Depression Mother   . Bipolar disorder Mother   . Dementia Mother   . Pulmonary fibrosis Mother   . Alcohol abuse Father   . Aneurysm Father        deceased age 69 brain  . Colon cancer Paternal Grandfather        age greater than 689   Home Medications:   Prior to Admission medications   Medication Sig Start Date End Date Taking? Authorizing Provider  albuterol (PROVENTIL HFA;VENTOLIN HFA) 108 (90 BASE) MCG/ACT inhaler Inhale 2 puffs into the lungs every 4 (four) hours as needed for wheezing or shortness of breath. 10/02/15  Yes Horton, CBarbette Hair MD  aspirin-acetaminophen-caffeine (EXCEDRIN MIGRAINE) 2530 089 5270MG tablet Take 2 tablets by mouth every 6 (six) hours as needed for headache.   Yes [provider]  clopidogrel (PLAVIX) 75 MG tablet Take 1 tablet (75 mg total) by mouth daily. 12/25/19  Yes AShelly Coss MD  ferrous sulfate 325 (65 FE) MG tablet Take 1 tablet (325 mg total) by mouth daily with breakfast. Patient taking differently: Take 325 mg by mouth daily at 12 noon.  12/24/19 12/23/20 Yes AShelly Coss MD  furosemide (LASIX) 40 MG tablet Take 1 tablet (40 mg total) by mouth 2 (two) times daily. 12/25/19  Yes AShelly Coss MD  LANTUS SOLOSTAR 100 UNIT/ML Solostar Pen Inject 40 Units into the skin daily.  10/19/19  Yes [provider]  levothyroxine (SYNTHROID) 125 MCG tablet Take 125 mcg by mouth daily. 12/15/19  Yes [provider]  lisinopril (PRINIVIL,ZESTRIL) 20 MG tablet Take 20 mg by mouth  daily.     Yes [provider]  LORazepam (ATIVAN) 1 MG tablet Take 1 mg by mouth 2 (two) times daily as needed for anxiety.  10/21/19  Yes [provider]  metFORMIN (GLUCOPHAGE) 500 MG tablet Take 500 mg by mouth 2 (two) times daily with a meal.  09/18/19  Yes [provider]  Multiple Vitamins-Minerals (MULTIVITAMIN WITH MINERALS) tablet Take 1  tablet by mouth daily.   Yes [provider]  oxyCODONE-acetaminophen (PERCOCET/ROXICET) 5-325 MG tablet Take 1-2 tablets by mouth every 4 (four) hours as needed for severe pain. 09/20/15  Yes Nat Christen, MD  pantoprazole (PROTONIX) 40 MG tablet Take 40 mg by mouth daily. 09/29/19  Yes [provider]  PARoxetine (PAXIL) 20 MG tablet Take 20 mg by mouth daily.  09/29/19  Yes [provider]  potassium chloride 20 MEQ TBCR Take 40 mEq by mouth daily. Patient taking differently: Take 20 mEq by mouth daily.  12/24/19 01/23/20 Yes Shelly Coss, MD  pravastatin (PRAVACHOL) 40 MG tablet Take 40 mg by mouth daily.     Yes [provider]  pregabalin (LYRICA) 75 MG capsule Take 75 mg by mouth daily.  10/19/19  Yes [provider]  traZODone (DESYREL) 100 MG tablet Take 100 mg by mouth at bedtime.  08/19/15  Yes [provider]     Allergies:     Allergies  Allergen Reactions  . Iohexol      Desc: CHEST TIGHTNESS,BRETHING PROBLEMS NEEDS PRE MEDS   . Tape Other (See Comments)    Tears skin  . Ciprofloxacin Rash  . Latex Rash    GLOVES   . Penicillins Swelling    Did it involve swelling of the face/tongue/throat, SOB, or low BP? Yes Did it involve sudden or severe rash/hives, skin peeling, or any reaction on the inside of your mouth or nose? No Did you need to seek medical attention at a hospital or doctor's office? Yes When did it last happen?1996 If all above answers are "NO", may proceed with cephalosporin use.  . Povidone-Iodine Rash     Physical Exam:    Vitals  Blood pressure (!) 117/51, pulse 77, temperature 98.4 F (36.9 C), temperature source Oral, resp. rate 17, height 5' 6"  (1.676 m), weight 77.1 kg, SpO2 97 %.  Physical Examination: General appearance - alert, well appearing, and in no distress  Mental status - alert, oriented to person, place, and time,  Eyes - sclera anicteric Neck - supple, no JVD elevation , Chest - clear  to auscultation bilaterally, symmetrical air movement,  Heart - S1 and S2 normal, regular  Abdomen - soft, nontender, nondistended, no masses or organomegaly Neurological - screening mental status exam normal, neck supple without rigidity, cranial nerves II through XII intact, DTR's normal and symmetric Extremities - no pedal edema noted, intact peripheral pulses , distal amputation of right forearm/wrist Skin - warm, dry    Data Review:    CBC Recent Labs  Lab 01/08/20 0850 01/09/20 1057  WBC 6.0 6.1  HGB 11.2 10.7*  HCT 34.8 36.1  PLT 224 207  MCV 87 96.0  MCH 28.1 28.5  MCHC 32.2 29.6*  RDW 19.4* 21.3*   ------------------------------------------------------------------------------------------------------------------  Chemistries  Recent Labs  Lab 01/08/20 0848 01/09/20 1057 01/09/20 1258  NA 134 132* 132*  K 6.7* 7.3* 7.5*  CL 99 102 105  CO2 21 22 21*  GLUCOSE 198* 284* 216*  BUN 26 36* 37*  CREATININE 1.48* 2.19* 2.17*  CALCIUM 10.0 9.3 9.6   ------------------------------------------------------------------------------------------------------------------ estimated creatinine clearance is 26 mL/min (A) (by C-G formula based on SCr of 2.17 mg/dL (H)). ------------------------------------------------------------------------------------------------------------------ No results for input(s): TSH, T4TOTAL, T3FREE, THYROIDAB in the last 72 hours.  Invalid input(s): FREET3   Coagulation profile No results for input(s): INR, PROTIME in the last 168  hours. ------------------------------------------------------------------------------------------------------------------- No results for input(s): DDIMER in the last 72 hours. -------------------------------------------------------------------------------------------------------------------  Cardiac Enzymes No results for input(s): CKMB, TROPONINI, MYOGLOBIN in the last 168 hours.  Invalid input(s): CK ------------------------------------------------------------------------------------------------------------------    Component Value Date/Time   BNP 417.0 (H) 12/19/2019 1710    Urinalysis    Component Value Date/Time   COLORURINE YELLOW 09/20/2015 1550   APPEARANCEUR CLEAR 09/20/2015 1550   LABSPEC 1.025 09/20/2015 1550   PHURINE 5.5 09/20/2015 1550   GLUCOSEU NEGATIVE 09/20/2015 1550   HGBUR TRACE (A) 09/20/2015 1550   BILIRUBINUR NEGATIVE 09/20/2015 1550   KETONESUR NEGATIVE 09/20/2015 1550   PROTEINUR >300 (A) 09/20/2015 1550   UROBILINOGEN 0.2 09/04/2014 1841   NITRITE NEGATIVE 09/20/2015 1550   LEUKOCYTESUR NEGATIVE 09/20/2015 1550    ----------------------------------------------------------------------------------------------------------------   Imaging Results:    DG Chest Portable 1 View  Result Date: 01/09/2020 CLINICAL DATA:  Pt reports was discharged approx 1 week ago. Reports was [facility] for [facility] and was told she had a 100% blockage. Reports they weren't able to put in a stent. Persistent chest pain EXAM: PORTABLE CHEST - 1 VIEW COMPARISON:  12/31/2019 FINDINGS: 4.1 cm masslike consolidation adjacent to the minor fissure as before. No new infiltrate or overt edema. Heart size and mediastinal contours are within normal limits. Aortic Atherosclerosis (ICD10-170.0). No effusion. No pneumothorax. Visualized bones unremarkable. IMPRESSION: Persistent masslike consolidation adjacent to the minor fissure. No acute findings. Electronically Signed   By: Lucrezia Europe M.D.   On: 01/09/2020 12:03    Radiological Exams on Admission: DG Chest Portable 1 View  Result Date: 01/09/2020 CLINICAL DATA:  Pt reports was discharged approx 1 week ago. Reports was [facility] for [facility] and was told she had a 100% blockage. Reports they weren't able to put in a stent. Persistent chest pain EXAM: PORTABLE CHEST - 1 VIEW COMPARISON:  12/31/2019 FINDINGS: 4.1 cm masslike consolidation adjacent to the minor fissure as before. No new infiltrate or overt edema. Heart size and mediastinal contours are within normal limits. Aortic Atherosclerosis (ICD10-170.0). No effusion. No pneumothorax. Visualized bones unremarkable. IMPRESSION: Persistent masslike consolidation adjacent to the minor fissure. No acute findings. Electronically Signed   By: Lucrezia Europe M.D.   On: 01/09/2020 12:03   DVT Prophylaxis -SCD/Heparin AM Labs Ordered, also please review Full Orders  Family Communication: Admission, patients condition and plan of care including tests being ordered have been discussed with the patient who indicate understanding and agree with the plan   Code Status - Full Code  Likely DC to  Home when acute hyperkalemia and chest pain issues resolved  Condition --- critical  Roxan Hockey M.D on 01/09/2020 at 4:35 PM Go to [email or web address] -  for contact info  [facility] - Office  [phone]

## 2020-01-09 NOTE — Progress Notes (Signed)
Received to 6E 30 via stretcher with [facility]. VSS. Denies CP or SOB. Oriented to room and unit. Cardiac monitor verified with CCMD. Call bell and phone placed within reach. Encouraged to call prn oob or other.

## 2020-01-09 NOTE — Progress Notes (Signed)
I got critical alert regarding patient's potassium of 6.7. I called the patient at 8 AM this morning. Patient did not answer the call. I left a voicemail asking the patient to call 911 and go to nearest emergency department. I explained to the patient that not doing so I could potentially be life-threatening. I will follow up again in some time.

## 2020-01-09 NOTE — ED Notes (Signed)
Report given to [facility] 

## 2020-01-10 ENCOUNTER — Other Ambulatory Visit: Payer: Self-pay

## 2020-01-10 DIAGNOSIS — E875 Hyperkalemia: Secondary | ICD-10-CM | POA: Diagnosis not present

## 2020-01-10 DIAGNOSIS — E119 Type 2 diabetes mellitus without complications: Secondary | ICD-10-CM

## 2020-01-10 DIAGNOSIS — J440 Chronic obstructive pulmonary disease with acute lower respiratory infection: Secondary | ICD-10-CM | POA: Diagnosis not present

## 2020-01-10 DIAGNOSIS — R072 Precordial pain: Secondary | ICD-10-CM | POA: Diagnosis not present

## 2020-01-10 DIAGNOSIS — I25110 Atherosclerotic heart disease of native coronary artery with unstable angina pectoris: Secondary | ICD-10-CM | POA: Diagnosis not present

## 2020-01-10 DIAGNOSIS — I200 Unstable angina: Secondary | ICD-10-CM | POA: Diagnosis not present

## 2020-01-10 DIAGNOSIS — I5032 Chronic diastolic (congestive) heart failure: Secondary | ICD-10-CM | POA: Diagnosis not present

## 2020-01-10 DIAGNOSIS — N1831 Chronic kidney disease, stage 3a: Secondary | ICD-10-CM | POA: Diagnosis not present

## 2020-01-10 DIAGNOSIS — J410 Simple chronic bronchitis: Secondary | ICD-10-CM

## 2020-01-10 DIAGNOSIS — N179 Acute kidney failure, unspecified: Secondary | ICD-10-CM

## 2020-01-10 DIAGNOSIS — Z794 Long term (current) use of insulin: Secondary | ICD-10-CM

## 2020-01-10 DIAGNOSIS — E034 Atrophy of thyroid (acquired): Secondary | ICD-10-CM

## 2020-01-10 LAB — BASIC METABOLIC PANEL
Anion gap: 11 (ref 5–15)
Anion gap: 9 (ref 5–15)
Anion gap: 11 (ref 5–15)
Anion gap: 10 (ref 5–15)
BUN: 19 mg/dL (ref 8–23)
BUN: 21 mg/dL (ref 8–23)
BUN: 24 mg/dL — ABNORMAL HIGH (ref 8–23)
BUN: 25 mg/dL — ABNORMAL HIGH (ref 8–23)
CO2: 44 mmol/L — ABNORMAL HIGH (ref 22–32)
CO2: 24 mmol/L (ref 22–32)
CO2: 29 mmol/L (ref 22–32)
CO2: 29 mmol/L (ref 22–32)
Calcium: 7.2 mg/dL — ABNORMAL LOW (ref 8.9–10.3)
Calcium: 8.8 mg/dL — ABNORMAL LOW (ref 8.9–10.3)
Calcium: 9.4 mg/dL (ref 8.9–10.3)
Calcium: 8.9 mg/dL (ref 8.9–10.3)
Chloride: 84 mmol/L — ABNORMAL LOW (ref 98–111)
Chloride: 103 mmol/L (ref 98–111)
Chloride: 96 mmol/L — ABNORMAL LOW (ref 98–111)
Chloride: 97 mmol/L — ABNORMAL LOW (ref 98–111)
Creatinine, Ser: 1.41 mg/dL — ABNORMAL HIGH (ref 0.44–1.00)
Creatinine, Ser: 1.6 mg/dL — ABNORMAL HIGH (ref 0.44–1.00)
Creatinine, Ser: 1.52 mg/dL — ABNORMAL HIGH (ref 0.44–1.00)
Creatinine, Ser: 1.64 mg/dL — ABNORMAL HIGH (ref 0.44–1.00)
GFR calc Af Amer: 44 mL/min — ABNORMAL LOW (ref >60)
GFR calc Af Amer: 38 mL/min — ABNORMAL LOW (ref >60)
GFR calc Af Amer: 40 mL/min — ABNORMAL LOW (ref >60)
GFR calc Af Amer: 37 mL/min — ABNORMAL LOW (ref >60)
GFR calc non Af Amer: 38 mL/min — ABNORMAL LOW (ref >60)
GFR calc non Af Amer: 33 mL/min — ABNORMAL LOW (ref >60)
GFR calc non Af Amer: 35 mL/min — ABNORMAL LOW (ref >60)
GFR calc non Af Amer: 32 mL/min — ABNORMAL LOW (ref >60)
Glucose, Bld: 750 mg/dL (ref 70–99)
Glucose, Bld: 253 mg/dL — ABNORMAL HIGH (ref 70–99)
Glucose, Bld: 284 mg/dL — ABNORMAL HIGH (ref 70–99)
Glucose, Bld: 261 mg/dL — ABNORMAL HIGH (ref 70–99)
Potassium: 4.2 mmol/L (ref 3.5–5.1)
Potassium: 6.4 mmol/L (ref 3.5–5.1)
Potassium: 5.3 mmol/L — ABNORMAL HIGH (ref 3.5–5.1)
Potassium: 4.5 mmol/L (ref 3.5–5.1)
Sodium: 139 mmol/L (ref 135–145)
Sodium: 136 mmol/L (ref 135–145)
Sodium: 136 mmol/L (ref 135–145)
Sodium: 136 mmol/L (ref 135–145)

## 2020-01-10 LAB — CBC
HCT: 27.9 % — ABNORMAL LOW (ref 36.0–46.0)
Hemoglobin: 8.7 g/dL — ABNORMAL LOW (ref 12.0–15.0)
MCH: 28.2 pg (ref 26.0–34.0)
MCHC: 31.2 g/dL (ref 30.0–36.0)
MCV: 90.6 fL (ref 80.0–100.0)
Platelets: 172 10*3/uL (ref 150–400)
RBC: 3.08 MIL/uL — ABNORMAL LOW (ref 3.87–5.11)
RDW: 21 % — ABNORMAL HIGH (ref 11.5–15.5)
WBC: 3.6 10*3/uL — ABNORMAL LOW (ref 4.0–10.5)
nRBC: 0 % (ref 0.0–0.2)

## 2020-01-10 LAB — CBC WITH DIFFERENTIAL/PLATELET
Abs Immature Granulocytes: 0.01 10*3/uL (ref 0.00–0.07)
Basophils Absolute: 0 10*3/uL (ref 0.0–0.1)
Basophils Relative: 0 %
Eosinophils Absolute: 0.2 10*3/uL (ref 0.0–0.5)
Eosinophils Relative: 4 %
HCT: 36.5 % (ref 36.0–46.0)
Hemoglobin: 11.1 g/dL — ABNORMAL LOW (ref 12.0–15.0)
Immature Granulocytes: 0 %
Lymphocytes Relative: 33 %
Lymphs Abs: 1.6 10*3/uL (ref 0.7–4.0)
MCH: 28 pg (ref 26.0–34.0)
MCHC: 30.4 g/dL (ref 30.0–36.0)
MCV: 92.2 fL (ref 80.0–100.0)
Monocytes Absolute: 0.4 10*3/uL (ref 0.1–1.0)
Monocytes Relative: 8 %
Neutro Abs: 2.7 10*3/uL (ref 1.7–7.7)
Neutrophils Relative %: 55 %
Platelets: 195 10*3/uL (ref 150–400)
RBC: 3.96 MIL/uL (ref 3.87–5.11)
RDW: 21.2 % — ABNORMAL HIGH (ref 11.5–15.5)
WBC: 4.9 10*3/uL (ref 4.0–10.5)
nRBC: 0 % (ref 0.0–0.2)

## 2020-01-10 LAB — GLUCOSE, CAPILLARY
Glucose-Capillary: 119 mg/dL — ABNORMAL HIGH (ref 70–99)
Glucose-Capillary: 275 mg/dL — ABNORMAL HIGH (ref 70–99)
Glucose-Capillary: 282 mg/dL — ABNORMAL HIGH (ref 70–99)

## 2020-01-10 MED ORDER — FUROSEMIDE 10 MG/ML IJ SOLN
40.0000 mg | Freq: Once | INTRAMUSCULAR | Status: AC
  Administered 2020-01-10: 40 mg via INTRAVENOUS
  Filled 2020-01-10: qty 4

## 2020-01-10 MED ORDER — INSULIN ASPART 100 UNIT/ML IV SOLN
10.0000 [IU] | Freq: Once | INTRAVENOUS | Status: AC
  Administered 2020-01-10: 10 [IU] via INTRAVENOUS

## 2020-01-10 MED ORDER — INSULIN ASPART 100 UNIT/ML ~~LOC~~ SOLN
0.0000 [IU] | Freq: Three times a day (TID) | SUBCUTANEOUS | Status: DC
  Administered 2020-01-10: 5 [IU] via SUBCUTANEOUS
  Administered 2020-01-11: 3 [IU] via SUBCUTANEOUS

## 2020-01-10 MED ORDER — INSULIN ASPART 100 UNIT/ML ~~LOC~~ SOLN: 0.0000 [IU] | SUBCUTANEOUS | Status: DC

## 2020-01-10 MED ORDER — DEXTROSE 50 % IV SOLN
25.0000 mL | Freq: Once | INTRAVENOUS | Status: AC
  Administered 2020-01-10: 25 mL via INTRAVENOUS
  Filled 2020-01-10: qty 50

## 2020-01-10 MED ORDER — SODIUM ZIRCONIUM CYCLOSILICATE 10 G PO PACK
10.0000 g | PACK | Freq: Once | ORAL | Status: AC
  Administered 2020-01-10: 10 g via ORAL
  Filled 2020-01-10: qty 1

## 2020-01-10 NOTE — Progress Notes (Signed)
PROGRESS NOTE    [name]  IRS:[id number] DOB: 1951/07/17 DOA: 01/09/2020 PCP: Lucia Gaskins, MD  Outpatient Specialists:Cardiology    Brief Narrative:  [name]  is a 69 y.o. female reformed smoker reformed drinker with history of COPD, diabetes type 2, hyperlipidemia, hypothyroidism, prior history of DVT not on anticoagulation, anxiety, depression , history of CAD with prior bare-metal stent to left circumflex post MI 1997, DES secondary to M1 branch in 2000, LHC on 12/24/2018 with total occlusion of proximal RCA awaiting angioplasty and stent placement, recent treatment for right middle lobe mass/pneumonia--- presents to the ED today after being called to come to ED bt her cardiologist---  had called her to come to the ED due to blood work revealing hyperkalemia, repeat BMP in the ED consistent with hyperkalemia  PTA patient was on lisinopril 20 mg and potassium 25mq.  Potassium on 01/08/2020 was 6.7, repeat potassium 7.3, patient received hyperkalemia cocktail in the ED repeat potassium went up to 7.5.  Hyperkalemia cocktail and bicarb repeated.  EKG without  "peaked"  T waves.  3/28: Renal function has returned to baseline although potassium remains elevated. She had an inaccurate lab draw with glucose noted to be 750 with bicarb of 44 which was then noted to be drawn from just below where the IV was infusing D5W with sodium bicarb infusion.   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 PNA   1)Severe Hyperkalemia--- potassium was 6.7 on 01/08/2020, repeat potassium 7.3 on 01/09/2020 --Patient  received hyperkalemia cocktail including bicarb infusion, calcium gluconate, albuterol, iv insulin, Lokelma and Kayexalate --Repeat potassium up to 7.5, will repeat albuterol, will give bicarb infusion, repeat IV insulin and repeat Lokelma -PTA patient was on lisinopril 20 mg and potassium 411m - -Stop lisinopril, stop potassium replacement -BMP every 6 hours -EKG without significant T wave peaking, patient actually has some T wave inversions. Maintain telemetry - Repeat BMP after inaccurate BMP noted to have potassium of 6.4. Will give 40 mg IV lasix, 10 g lokelma for 3rd and final dose today. Will also give IV insulin 10 U and 25 ml of 50% dextrose. - BMP at 5 pm and again at 11 PM  2)CAD--- status post bare-metal stent to left circumflex in the post MI state in 1997, status post DES to OMSandovaln 2000 --LHC on 12/24/2019 with total occlusion of proximal RCA--- awaiting angioplasty and stenting --Continue aspirin and Plavix --Resolved as she denies any chest pain this morning  3)HFpEF--- patient with chronic diastolic dysfunction CHF, echo from March 2021 with EF of 55 to 60% with grade 2 diastolic dysfunction --DC lisinopril due to hyperkalemia --IV lasix as above and will likely continue PO in AM  4) chronic normocytic anemia with concerns about recurrent GI blood loss--- recent work-up consistent with severe iron deficiency anemia with low iron and low ferritin  ---in March 2021 patient underwent EGD and colonoscopy by [facility] with mild gastritis, AVM on the transverse colon, APC was applied -Patient was transfused PRBC -Hemoglobin stable  5)AKI with hyperkalemia----acute kidney injury on CKD stage - IIIa- worsening renal failure  due to Lasix use compounded by lisinopril and Metformin use - creatinine on admission= 2.19  , baseline creatinine = 1.48 (01/08/20) -- renally adjust medications, avoid nephrotoxic agents / dehydration  / hypotension -Hold lisinopril, hold Metformin -  Appears to be at baseline this morning.  6)Rt ML Mass-patient was recently treated for pneumonia, serial imaging studies suggest persistent masslike lesion on the right middle lobe----may need inpatient pulmonology consult prior to discharge given persistence of this finding   7)DM2-A1c 8.15 December 2019, reflecting uncontrolled DM --Give Lantus insulin 30 units daily -,Use Novolog/Humalog Sliding scale insulin with Accu-Cheks/Fingersticks as ordered - Glucose noted at 750 this morning is inaccurate due to the above   8)Hypothyroidism--- continue levothyroxine, check TSH  9) COPD--- appears stable at this time with no exacerbation, continue bronchodilators especially in view of hyperkalemia  DVT prophylaxis: Heparin Code Status: Full Family Communication: Spoke to patient at bedside Disposition Plan: Anticipate discharge home once critical hyperkalemia resolves  Consultants:   Cardiology  Procedures: None  Antimicrobials:   None   Subjective: Patient reports feeling well with no complaints. She says she is just here because she was told by her doctor to get to the ED due to her potassium. She denies any current CP, SOB, N/V/D, fever, chills, muscle cramps.   Objective: Vitals:   01/09/20 1824 01/09/20 2017 01/10/20 0010 01/10/20 0333  BP: 138/71 (!) 116/55 110/64 (!) 134/56  Pulse: 71 78 73 69  Resp: 17 18 18 18   Temp: 98.7 F (37.1 C) 98.9 F (37.2 C) 98.9 F (37.2 C) 98.7 F (37.1 C)  TempSrc: Oral Oral Oral Oral  SpO2: 93% 93% 95% (!) 89%  Weight: 78.2 kg   77.9 kg  Height: 5' 6"  (1.676 m)       Intake/Output Summary (Last 24 hours) at 01/10/2020 1243 Last data filed at 01/10/2020 0600 Gross per 24 hour  Intake 839.8 ml  Output --  Net 839.8 ml   Filed Weights   01/09/20 1045 01/09/20 1824 01/10/20 0333  Weight: 77.1 kg 78.2 kg 77.9 kg    Examination:  General exam: Appears calm and comfortable  Respiratory system: Clear to auscultation. Respiratory  effort normal. Cardiovascular system: S1 & S2 heard, RRR. No JVD, murmurs, rubs, gallops or clicks. No pedal edema. Gastrointestinal system: Abdomen is nondistended, soft and nontender. No organomegaly or masses felt. Normal bowel sounds heard. Central nervous system: Alert and oriented. No focal neurological deficits. Extremities: Symmetric 5 x 5 power. Skin: No rashes, lesions or ulcers Psychiatry: Judgement and insight appear normal. Mood & affect appropriate.     Data Reviewed: I have personally reviewed following labs and imaging studies  CBC: Recent Labs  Lab 01/08/20 0850 01/09/20 1057 01/10/20 0508  WBC 6.0 6.1 3.6*  HGB 11.2 10.7* 8.7*  HCT 34.8 36.1 27.9*  MCV 87 96.0 90.6  PLT 224 207 638   Basic Metabolic Panel: Recent Labs  Lab 01/09/20 1057 01/09/20 1258 01/09/20 1638 01/10/20 0508 01/10/20 1121  NA 132* 132* 134* 139 136  K 7.3* 7.5* 5.8* 4.2 6.4*  CL 102 105 103 84* 103  CO2 22 21* 24 44* 24  GLUCOSE 284* 216* 203* 750* 253*  BUN 36* 37* 34* 19 21  CREATININE 2.19* 2.17* 2.06* 1.41* 1.60*  CALCIUM 9.3 9.6 9.2 7.2* 8.8*   GFR: Estimated Creatinine Clearance: 35.4 mL/min (A) (by C-G formula based on SCr of 1.6 mg/dL (H)). Liver Function Tests: No results for input(s): AST, ALT, ALKPHOS, BILITOT, PROT,  ALBUMIN in the last 168 hours. No results for input(s): LIPASE, AMYLASE in the last 168 hours. No results for input(s): AMMONIA in the last 168 hours. Coagulation Profile: No results for input(s): INR, PROTIME in the last 168 hours. Cardiac Enzymes: No results for input(s): CKTOTAL, CKMB, CKMBINDEX, TROPONINI in the last 168 hours. BNP (last 3 results) No results for input(s): PROBNP in the last 8760 hours. HbA1C: No results for input(s): HGBA1C in the last 72 hours. CBG: Recent Labs  Lab 01/09/20 1307 01/10/20 0906 01/10/20 1141  GLUCAP 213* 119* 275*   Lipid Profile: No results for input(s): CHOL, HDL, LDLCALC, TRIG, CHOLHDL, LDLDIRECT in  the last 72 hours. Thyroid Function Tests: No results for input(s): TSH, T4TOTAL, FREET4, T3FREE, THYROIDAB in the last 72 hours. Anemia Panel: No results for input(s): VITAMINB12, FOLATE, FERRITIN, TIBC, IRON, RETICCTPCT in the last 72 hours. Urine analysis:    Component Value Date/Time   COLORURINE YELLOW 09/20/2015 1550   APPEARANCEUR CLEAR 09/20/2015 1550   LABSPEC 1.025 09/20/2015 1550   PHURINE 5.5 09/20/2015 1550   GLUCOSEU NEGATIVE 09/20/2015 1550   HGBUR TRACE (A) 09/20/2015 1550   BILIRUBINUR NEGATIVE 09/20/2015 1550   KETONESUR NEGATIVE 09/20/2015 1550   PROTEINUR >300 (A) 09/20/2015 1550   UROBILINOGEN 0.2 09/04/2014 1841   NITRITE NEGATIVE 09/20/2015 1550   LEUKOCYTESUR NEGATIVE 09/20/2015 1550   Sepsis Labs: @LABRCNTIP (procalcitonin:4,lacticidven:4)  ) Recent Results (from the past 240 hour(s))  Respiratory Panel by RT PCR (Flu A&B, Covid) - Nasopharyngeal Swab     Status: None   Collection Time: 01/09/20  2:58 PM   Specimen: Nasopharyngeal Swab  Result Value Ref Range Status   SARS Coronavirus 2 by RT PCR NEGATIVE NEGATIVE Final    Comment: (NOTE) SARS-CoV-2 target nucleic acids are NOT DETECTED. The SARS-CoV-2 RNA is generally detectable in upper respiratoy specimens during the acute phase of infection. The lowest concentration of SARS-CoV-2 viral copies this assay can detect is 131 copies/mL. A negative result does not preclude SARS-Cov-2 infection and should not be used as the sole basis for treatment or other patient management decisions. A negative result may occur with  improper specimen collection/handling, submission of specimen other than nasopharyngeal swab, presence of viral mutation(s) within the areas targeted by this assay, and inadequate number of viral copies (<131 copies/mL). A negative result must be combined with clinical observations, patient history, and epidemiological information. The expected result is Negative. Fact Sheet for  Patients:  PinkCheek.be Fact Sheet for Healthcare Providers:  GravelBags.it This test is not yet ap proved or cleared by the [place] FDA and  has been authorized for detection and/or diagnosis of SARS-CoV-2 by FDA under an Emergency Use Authorization (EUA). This EUA will remain  in effect (meaning this test can be used) for the duration of the COVID-19 declaration under Section 564(b)(1) of the Act, 21 U.S.C. section 360bbb-3(b)(1), unless the authorization is terminated or revoked sooner.    Influenza A by PCR NEGATIVE NEGATIVE Final   Influenza B by PCR NEGATIVE NEGATIVE Final    Comment: (NOTE) The Xpert Xpress SARS-CoV-2/FLU/RSV assay is intended as an aid in  the diagnosis of influenza from Nasopharyngeal swab specimens and  should not be used as a sole basis for treatment. Nasal washings and  aspirates are unacceptable for Xpert Xpress SARS-CoV-2/FLU/RSV  testing. Fact Sheet for Patients: PinkCheek.be Fact Sheet for Healthcare Providers: GravelBags.it This test is not yet approved or cleared by the [place] FDA and  has been authorized for detection  and/or diagnosis of SARS-CoV-2 by  FDA under an Emergency Use Authorization (EUA). This EUA will remain  in effect (meaning this test can be used) for the duration of the  Covid-19 declaration under Section 564(b)(1) of the Act, 21  U.S.C. section 360bbb-3(b)(1), unless the authorization is  terminated or revoked. Performed at [facility], [address], [place], [place] [address]          Radiology Studies: DG Chest Portable 1 View  Result Date: 01/09/2020 CLINICAL DATA:  Pt reports was discharged approx 1 week ago. Reports [facility] and was told she had a 100% blockage. Reports they weren't able to put in a stent. Persistent chest pain EXAM: PORTABLE CHEST - 1 VIEW COMPARISON:   12/31/2019 FINDINGS: 4.1 cm masslike consolidation adjacent to the minor fissure as before. No new infiltrate or overt edema. Heart size and mediastinal contours are within normal limits. Aortic Atherosclerosis (ICD10-170.0). No effusion. No pneumothorax. Visualized bones unremarkable. IMPRESSION: Persistent masslike consolidation adjacent to the minor fissure. No acute findings. Electronically Signed   By: Lucrezia Europe M.D.   On: 01/09/2020 12:03        Scheduled Meds: . clopidogrel  75 mg Oral Daily  . dextrose  25 mL Intravenous Once  . ferrous sulfate  325 mg Oral Q breakfast  . furosemide  40 mg Intravenous Once  . heparin  5,000 Units Subcutaneous Q8H  . insulin aspart  0-9 Units Subcutaneous TID WC  . insulin aspart  10 Units Intravenous Once  . insulin glargine  30 Units Subcutaneous Daily  . levothyroxine  125 mcg Oral Daily  . multivitamin with minerals  1 tablet Oral Daily  . pantoprazole  40 mg Oral Daily  . PARoxetine  20 mg Oral Daily  . pravastatin  40 mg Oral Daily  . pregabalin  75 mg Oral Daily  . sodium chloride flush  3 mL Intravenous Q12H  . sodium zirconium cyclosilicate  10 g Oral Once  . traZODone  100 mg Oral QHS   Continuous Infusions: . sodium chloride       LOS: 1 day   Arlan Organ, DO [facility]  If 7PM-7AM, please contact night-coverage [email or web address] 01/10/2020, 12:43 PM

## 2020-01-10 NOTE — Progress Notes (Signed)
CRITICAL VALUE ALERT  Critical Value:  K 6.4  Date & Time Notied: 3/28 1205  Provider Notified: Gloriann Loan  Orders Received/Actions taken: dextrose, insulin, lasix, lokelma, EKG

## 2020-01-10 NOTE — Consult Note (Signed)
CARDIOLOGY CONSULT NOTE  Patient ID: [name] MRN: [id number] DOB/AGE: January 27, 1951 69 y.o.  Admit date: 01/09/2020 Referring Physician: [facility] hospitalist  Arlan Organ, DO  Outpatient cardiologist: Dr. Vernell Leep Consultant: Rex Kras, DO Reason for Consultation: RCA occluded  HPI:   69 y.o. Caucasian female  with established coronary artery disease, controlled hypertension, type 2 diabetes mellitus, hyperlipidemia, former smoker, hypothyroidism, COPD/asthma, depression, admitted with hyperkalemia.  Cardiology has been consulted for evaluation of documented RCA occlusion.  Patient was recently hospitalized earlier this month and underwent a cardiac evaluation including an echo and a left heart catheterization.  Per angiography and left heart catheterization report patient was noted to have a proximal chronic total occlusion with collaterals from left to right and right to right.  Prior to intervening on the CTO the decision was to start the patient on Plavix see how she tolerates it in the setting of her underlying anemia prior to subjecting her to dual antiplatelet therapy.  Post hospitalization patient followed up in the office and was seen by Dr. Vernell Leep.  Patient was hypotensive in the differential diagnosis included orthostasis hypotension versus gastrointestinal bleeding.  Patient was sent to the ER for further evaluation.  Patient later left AMA.  Recently outpatient labs were performed which noted hyperkalemia.  Patient was notified and was asked to come to the nearest hospital for treatment.  Patient went to [facility] and hyperkalemia was reconfirmed on repeat blood testing.  In the interim, patient voiced concern for chest discomfort and given the EKG changes cardiology was called for further evaluation and management.  She was transferred to [facility] given her complex past medical history, recent anemia, concern for chest pain that may need  intervention.  Patient's chest pain resolved very shortly thereafter EKG changes normalized and high-sensitivity troponins were negative x2.  At the time of evaluation today patient is completely asymptomatic from a cardiovascular standpoint.   Review of laboratory values notes acute kidney injury serum creatinine on admission 2.19 mg/dL and improving.  Most recent Estimated Creatinine Clearance: 35.4 mL/min (A) (by C-G formula based on SCr of 1.6 mg/dL (H)).   Hemoglobin on December 29, 2019 was 12 g/dL, on admission 10.7 g/dL and today 8.7 g/dL.  Patient does have black tarry stools but this could be secondary to blood loss and or iron supplement.  Patient continues to be hyperkalemic but improving.  Most recent serum potassium 6.4 despite providing treatment for hyperkalemia and holding potassium as well as lisinopril.  Patient continues to have right middle lobe lung opacity on repeat imaging.  Past Medical History:  Diagnosis Date  . Allergic rhinitis   . 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 ASCVD (arteriosclerotic cardiovascular disease)    MI in 96 requiring BMS CX; DES to M1 in 2000;normal coronary angiography in 2004  . Cholelithiasis   . COPD (chronic obstructive pulmonary disease) ([place])   . DDD (degenerative disc disease), lumbar   . Depression   . Diabetes mellitus   . Diabetes mellitus type I ([place])   . DVT (deep venous thrombosis) ([place])   . GERD (gastroesophageal reflux disease)   . Hyperlipidemia   . Hypothyroidism   . Low back pain   . Nephrolithiasis 2006   stone extraction   . Peripheral neuropathy   . Pneumonia 12/22/2019  . Sciatic pain    right  . Tobacco abuse   . Tremor      Past Surgical  History:  Procedure Laterality Date  . BACK SURGERY    . CHOLECYSTECTOMY    . COLONOSCOPY  01/2009   WHQ:PRFFMB rectum/repeat in 5 yrs  . COLONOSCOPY N/A 04/29/2014   Dr.Rourk- attempted/incomplete colonoscopy. inadequate prep  . COLONOSCOPY N/A 05/27/2014    WGY:[id number] coli. Colonic polyps-removed as described above.Status post segmental biopsy  . COLONOSCOPY WITH PROPOFOL N/A 12/23/2019   Procedure: COLONOSCOPY WITH PROPOFOL;  Surgeon: Ronnette Juniper, MD;  Location: [place];  Service: Gastroenterology;  Laterality: N/A;  . DILATION AND CURETTAGE OF UTERUS  1974  . ESOPHAGOGASTRODUODENOSCOPY  05/2010   Dr. Tessie Fass, erosion. 5F dilation  . ESOPHAGOGASTRODUODENOSCOPY N/A 04/29/2014   Dr.Rourk- normal esophagus s/p passage of maloney dilator. small hiatal hernia- bx= chronic inflammation.  . ESOPHAGOGASTRODUODENOSCOPY (EGD) WITH PROPOFOL N/A 12/23/2019   Procedure: ESOPHAGOGASTRODUODENOSCOPY (EGD) WITH PROPOFOL;  Surgeon: Ronnette Juniper, MD;  Location: [place];  Service: Gastroenterology;  Laterality: N/A;  . HAND AMPUTATION Right    traumatic  . HEMOSTASIS CLIP PLACEMENT  12/23/2019   Procedure: HEMOSTASIS CLIP PLACEMENT;  Surgeon: Ronnette Juniper, MD;  Location: [place];  Service: Gastroenterology;;  . HOT HEMOSTASIS N/A 12/23/2019   Procedure: HOT HEMOSTASIS (ARGON PLASMA COAGULATION/BICAP);  Surgeon: Ronnette Juniper, MD;  Location: [place];  Service: Gastroenterology;  Laterality: N/A;  . LEFT HEART CATH AND CORONARY ANGIOGRAPHY N/A 12/24/2019   Procedure: LEFT HEART CATH AND CORONARY ANGIOGRAPHY;  Surgeon: Nigel Mormon, MD;  Location: [place] [facility];  Service: Cardiovascular;  Laterality: N/A;  . Venia Minks DILATION N/A 04/29/2014   Procedure: Venia Minks DILATION;  Surgeon: Daneil Dolin, MD;  [facility];  Service: Endoscopy;  Laterality: N/A;  . PARTIAL HYSTERECTOMY  1978  . POLYPECTOMY  12/23/2019   Procedure: POLYPECTOMY;  Surgeon: Ronnette Juniper, MD;  Location: [facility];  Service: Gastroenterology;;  . SHOULDER SURGERY     Left shoulder for RTC;left arm surgery '98/left hand surgery 2001  . TOTAL ABDOMINAL HYSTERECTOMY W/ BILATERAL SALPINGOOPHORECTOMY  2002  . UMBILICAL HERNIA REPAIR  2008     Family History   Problem Relation Age of Onset  . Depression Mother   . Bipolar disorder Mother   . Dementia Mother   . Pulmonary fibrosis Mother   . Alcohol abuse Father   . Aneurysm Father        deceased age 73, brain  . Colon cancer Paternal Grandfather        age greater than 2     Social History: Social History   Tobacco Use  . Smoking status: Former Smoker    Packs/day: 0.50    Years: 40.00    Pack years: 20.00    Types: Cigarettes    Quit date: 08/30/2017    Years since quitting: 2.3  . Smokeless tobacco: Never Used  . Tobacco comment: 1/2 pack per day  Substance Use Topics  . Alcohol use: No  . Drug use: No    Medications Prior to Admission  Medication Sig Dispense Refill Last Dose  . albuterol (PROVENTIL HFA;VENTOLIN HFA) 108 (90 BASE) MCG/ACT inhaler Inhale 2 puffs into the lungs every 4 (four) hours as needed for wheezing or shortness of breath. 1 Inhaler 0 01/09/2020 at Unknown time  . aspirin-acetaminophen-caffeine (EXCEDRIN MIGRAINE) 250-250-65 MG tablet Take 2 tablets by mouth every 6 (six) hours as needed for headache.   Past Week at Unknown time  . clopidogrel (PLAVIX) 75 MG tablet Take 1 tablet (75 mg total) by mouth daily. 30 tablet 1 01/09/2020 at  Unknown time  . ferrous sulfate 325 (65 FE) MG tablet Take 1 tablet (325 mg total) by mouth daily with breakfast. (Patient taking differently: Take 325 mg by mouth daily at 12 noon. ) 30 tablet 1 01/09/2020 at Unknown time  . furosemide (LASIX) 40 MG tablet Take 1 tablet (40 mg total) by mouth 2 (two) times daily. 30 tablet 1 01/09/2020 at Unknown time  . LANTUS SOLOSTAR 100 UNIT/ML Solostar Pen Inject 40 Units into the skin daily.    01/08/2020 at Unknown time  . levothyroxine (SYNTHROID) 125 MCG tablet Take 125 mcg by mouth daily.   01/09/2020 at Unknown time  . lisinopril (PRINIVIL,ZESTRIL) 20 MG tablet Take 20 mg by mouth daily.     01/09/2020 at Unknown time  . LORazepam (ATIVAN) 1 MG tablet Take 1 mg by mouth 2 (two) times  daily as needed for anxiety.    01/09/2020 at Unknown time  . metFORMIN (GLUCOPHAGE) 500 MG tablet Take 500 mg by mouth 2 (two) times daily with a meal.    01/09/2020 at Unknown time  . Multiple Vitamins-Minerals (MULTIVITAMIN WITH MINERALS) tablet Take 1 tablet by mouth daily.   01/09/2020 at Unknown time  . oxyCODONE-acetaminophen (PERCOCET/ROXICET) 5-325 MG tablet Take 1-2 tablets by mouth every 4 (four) hours as needed for severe pain. 12 tablet 0 01/09/2020 at Unknown time  . pantoprazole (PROTONIX) 40 MG tablet Take 40 mg by mouth daily.   01/09/2020 at Unknown time  . PARoxetine (PAXIL) 20 MG tablet Take 20 mg by mouth daily.    01/09/2020 at Unknown time  . potassium chloride 20 MEQ TBCR Take 40 mEq by mouth daily. (Patient taking differently: Take 20 mEq by mouth daily. ) 60 tablet 1 01/09/2020 at Unknown time  . pravastatin (PRAVACHOL) 40 MG tablet Take 40 mg by mouth daily.     01/09/2020 at Unknown time  . pregabalin (LYRICA) 75 MG capsule Take 75 mg by mouth daily.    01/09/2020 at Unknown time  . traZODone (DESYREL) 100 MG tablet Take 100 mg by mouth at bedtime.    01/08/2020 at Unknown time    Review of Systems  Constitution: Negative for decreased appetite, malaise/fatigue, weight gain and weight loss.  HENT: Negative for congestion.   Eyes: Negative for visual disturbance.  Cardiovascular: Negative for chest pain, dyspnea on exertion, leg swelling, palpitations and syncope.  Respiratory: Negative for cough, shortness of breath and wheezing.   Endocrine: Negative for cold intolerance.  Hematologic/Lymphatic: Does not bruise/bleed easily.  Skin: Negative for itching and rash.  Musculoskeletal: Negative for myalgias.  Gastrointestinal: Positive for melena. Negative for nausea and vomiting.  Genitourinary: Negative for dysuria.  Neurological: Negative for dizziness and weakness.  Psychiatric/Behavioral: The patient is not nervous/anxious.   All other systems reviewed and are  negative.   Physical Exam: Physical Exam  Constitutional: She is oriented to person, place, and time. She appears well-developed and well-nourished. No distress.  HENT:  Head: Normocephalic and atraumatic.  Eyes: Pupils are equal, round, and reactive to light. Conjunctivae are normal.  Neck: No JVD present.  Cardiovascular: Normal rate, regular rhythm and intact distal pulses.  Pulmonary/Chest: Effort normal. She has no wheezes. She has no rales. She exhibits no tenderness.  Abdominal: Soft. Bowel sounds are normal. There is no abdominal tenderness. There is no rebound.  Musculoskeletal:        General: No edema.     Comments: Right below wrist amputation  Lymphadenopathy:    She has no  cervical adenopathy.  Neurological: She is alert and oriented to person, place, and time. No cranial nerve deficit.  Skin: Skin is warm and dry.  Psychiatric: She has a normal mood and affect.  Nursing note and vitals reviewed.  Labs:   Lab Results  Component Value Date   WBC 3.6 (L) 01/10/2020   HGB 8.7 (L) 01/10/2020   HCT 27.9 (L) 01/10/2020   MCV 90.6 01/10/2020   PLT 172 01/10/2020    Recent Labs  Lab 01/10/20 1121  NA 136  K 6.4*  CL 103  CO2 24  BUN 21  CREATININE 1.60*  CALCIUM 8.8*  GLUCOSE 253*   Lab Results  Component Value Date   CREATININE 1.60 (H) 01/10/2020   CREATININE 1.41 (H) 01/10/2020   CREATININE 2.06 (H) 01/09/2020   Hemoglobin & Hematocrit     Component Value Date/Time   HGB 8.7 (L) 01/10/2020 0508   HGB 11.2 01/08/2020 0850   HCT 27.9 (L) 01/10/2020 0508   HCT 34.8 01/08/2020 0850   Lipid Panel     Component Value Date/Time   CHOL 142 05/30/2009 2042   TRIG 189 (H) 05/30/2009 2042   HDL 36 (L) 05/30/2009 2042   CHOLHDL 3.9 Ratio 05/30/2009 2042   VLDL 38 05/30/2009 2042   LDLCALC 68 05/30/2009 2042    BNP (last 3 results) Recent Labs    12/19/19 1710  BNP 417.0*    Cardiac Panel (last 3 results) Recent Labs    01/09/20 1057  01/09/20 1258  TROPONINIHS 5 6   HEMOGLOBIN A1C Lab Results  Component Value Date   HGBA1C 8.2 (H) 12/20/2019   MPG 188.64 12/20/2019     Radiology: DG Chest Portable 1 View  Result Date: 01/09/2020 CLINICAL DATA:  Pt reports was discharged approx 1 week ago. Reports [facility] and was told she had a 100% blockage. Reports they weren't able to put in a stent. Persistent chest pain EXAM: PORTABLE CHEST - 1 VIEW COMPARISON:  12/31/2019 FINDINGS: 4.1 cm masslike consolidation adjacent to the minor fissure as before. No new infiltrate or overt edema. Heart size and mediastinal contours are within normal limits. Aortic Atherosclerosis (ICD10-170.0). No effusion. No pneumothorax. Visualized bones unremarkable. IMPRESSION: Persistent masslike consolidation adjacent to the minor fissure. No acute findings. Electronically Signed   By: Lucrezia Europe M.D.   On: 01/09/2020 12:03    Scheduled Meds: . clopidogrel  75 mg Oral Daily  . ferrous sulfate  325 mg Oral Q breakfast  . heparin  5,000 Units Subcutaneous Q8H  . insulin aspart  0-9 Units Subcutaneous TID WC  . insulin glargine  30 Units Subcutaneous Daily  . levothyroxine  125 mcg Oral Daily  . multivitamin with minerals  1 tablet Oral Daily  . pantoprazole  40 mg Oral Daily  . PARoxetine  20 mg Oral Daily  . pravastatin  40 mg Oral Daily  . pregabalin  75 mg Oral Daily  . sodium chloride flush  3 mL Intravenous Q12H  . traZODone  100 mg Oral QHS   Continuous Infusions: . sodium chloride     PRN Meds:.sodium chloride, acetaminophen **OR** acetaminophen, albuterol, LORazepam, ondansetron **OR** ondansetron (ZOFRAN) IV, oxyCODONE-acetaminophen, polyethylene glycol, sodium chloride flush  CARDIAC STUDIES:  EKG  12/20/2019: Sinus rhythm. Old anterior infarct. Lateral ischemia, slightly more prominent compared to previous EKG in 2016.  01/09/2020 1054: Normal sinus rhythm with a ventricular rate of 92 bpm, normal axis  deviation, ST-T changes in  the high lateral, lateral leads, and inferiorly.  Without evidence of underlying myocardial injury pattern.  01/09/2020 1611: Normal sinus rhythm with a ventricular rate of 80 bpm.  Compared to prior EKG the ST-T changes have resolved.  Echocardiogram: 12/21/2019: LVEF 55 to 60%, no regional wall motion abnormalities, grade 2 diastolic impairment, mildly dilated left atrium, mild to moderate MR.  Heart catheterization: 12/24/2019:  LM: Normal LAD: Minimal luminal irregularities LCx: Prox LCx 30%, prox 50% OM1 stenosis      RCA: Prox RCA CTO with right-to right bridging collaterals, and left-to-right collaterals LVEDP 28 mmHg. Hb 7.9 g/dl.  Recommendation:            She needs intervention to her prox RCA. However, need to optimize her Hb and LVEDP first.   Assessment & Recommendations: 69 y.o. Caucasian female  with established coronary artery disease, benign hypertension, type 2 diabetes mellitus, hyperlipidemia, former smoker, hypothyroidism, COPD/asthma, depression, admitted secondary to hyperkalemia cardiology consulted secondary to recent finding of RCA occlusion.  Established coronary artery disease without angina pectoris and prior interventions:  Currently patient does not have anginal symptoms.  High-sensitivity troponin negative x2  EKG findings did note ST-T changes suggestive of underlying ischemia but resolved upon treatment of hyperkalemia.  Patient remains in normal sinus rhythm on telemetry.  Currently not on antianginal therapy.  Consider starting Lopressor once her blood pressure improves.  Most recent echocardiogram illustrated preserved left ventricular systolic function.  Most recent left heart catheterization does note a proximal RCA occlusion but the patient does have collateral circulation from both the left and the right coronary system.  In the setting of hyperkalemia, acute kidney injury, anemia and no anginal symptoms plus  preserved left ventricular systolic function would refrain from cardiac interventions at this time unless change in clinical status.  Her initial episode of chest discomfort could be secondary to supply demand physiology in such setting of anemia underlying CAD.  Did speak to attending physician and considering possible GI evaluation given her underlying anemia.  Hyperkalemia:  Patient continues to be hyperkalemic despite medical treatment and holding lisinopril and potassium supplements that she was taking as outpatient basis.  A component of hyperkalemia could be secondary to her anemia.  Acute kidney injury on chronic kidney disease: Improving currently managed by primary team   Secondary diagnoses: Right middle lobe opacity: Please refer to chest x-ray from 01/09/2020.  Currently managed by primary team.  Hypertension with chronic kidney disease: Continue to monitor.  Blood pressures trending low.  Will hold off ACE inhibitor/ARB's in the setting of hyperkalemia at this time.  May consider beta-blocker therapy when clinically indicated.  Insulin-dependent diabetes mellitus type 2: Currently managed by primary team.  Mixed hyperlipidemia: Continue statin therapy.  Does not endorse myalgias.  Former smoker: Educated on importance of continued smoking cessation.  Patient's questions and concerns were addressed to her satisfaction. She voices understanding of the instructions provided during this encounter.   Upon discharge patient can follow-up with her primary cardiologist Dr. Vernell Leep at [facility] cardiovascular.  This note was created using a voice recognition software as a result there may be grammatical errors inadvertently enclosed that do not reflect the nature of this encounter. Every attempt is made to correct such errors.  Rex Kras, DO, [facility]. [facility] Pager: [phone] Office: [phone]

## 2020-01-10 NOTE — Progress Notes (Signed)
CRITICAL VALUE ALERT  Critical Value:  Glucose 750  Date & Time Notied:  3/28 0803  Provider Notified: Gloriann Loan  Orders Received/Actions taken: d/c dextrose and bicarb infusion. Novolog ordered

## 2020-01-11 ENCOUNTER — Ambulatory Visit: Payer: Self-pay | Admitting: Cardiology

## 2020-01-11 DIAGNOSIS — R918 Other nonspecific abnormal finding of lung field: Secondary | ICD-10-CM

## 2020-01-11 DIAGNOSIS — E875 Hyperkalemia: Secondary | ICD-10-CM | POA: Diagnosis not present

## 2020-01-11 DIAGNOSIS — J410 Simple chronic bronchitis: Secondary | ICD-10-CM | POA: Diagnosis not present

## 2020-01-11 DIAGNOSIS — I200 Unstable angina: Secondary | ICD-10-CM | POA: Diagnosis not present

## 2020-01-11 DIAGNOSIS — R072 Precordial pain: Secondary | ICD-10-CM | POA: Diagnosis not present

## 2020-01-11 DIAGNOSIS — N179 Acute kidney failure, unspecified: Secondary | ICD-10-CM | POA: Diagnosis not present

## 2020-01-11 LAB — CBC WITH DIFFERENTIAL/PLATELET
Abs Immature Granulocytes: 0.01 10*3/uL (ref 0.00–0.07)
Basophils Absolute: 0 10*3/uL (ref 0.0–0.1)
Basophils Relative: 1 %
Eosinophils Absolute: 0.2 10*3/uL (ref 0.0–0.5)
Eosinophils Relative: 4 %
HCT: 34.4 % — ABNORMAL LOW (ref 36.0–46.0)
Hemoglobin: 10.4 g/dL — ABNORMAL LOW (ref 12.0–15.0)
Immature Granulocytes: 0 %
Lymphocytes Relative: 41 %
Lymphs Abs: 1.6 10*3/uL (ref 0.7–4.0)
MCH: 27.6 pg (ref 26.0–34.0)
MCHC: 30.2 g/dL (ref 30.0–36.0)
MCV: 91.2 fL (ref 80.0–100.0)
Monocytes Absolute: 0.3 10*3/uL (ref 0.1–1.0)
Monocytes Relative: 7 %
Neutro Abs: 1.8 10*3/uL (ref 1.7–7.7)
Neutrophils Relative %: 47 %
Platelets: 165 10*3/uL (ref 150–400)
RBC: 3.77 MIL/uL — ABNORMAL LOW (ref 3.87–5.11)
RDW: 20.7 % — ABNORMAL HIGH (ref 11.5–15.5)
WBC: 3.9 10*3/uL — ABNORMAL LOW (ref 4.0–10.5)
nRBC: 0 % (ref 0.0–0.2)

## 2020-01-11 LAB — GLUCOSE, CAPILLARY
Glucose-Capillary: 137 mg/dL — ABNORMAL HIGH (ref 70–99)
Glucose-Capillary: 239 mg/dL — ABNORMAL HIGH (ref 70–99)

## 2020-01-11 LAB — BASIC METABOLIC PANEL
Anion gap: 10 (ref 5–15)
BUN: 21 mg/dL (ref 8–23)
CO2: 29 mmol/L (ref 22–32)
Calcium: 8.8 mg/dL — ABNORMAL LOW (ref 8.9–10.3)
Chloride: 99 mmol/L (ref 98–111)
Creatinine, Ser: 1.44 mg/dL — ABNORMAL HIGH (ref 0.44–1.00)
GFR calc Af Amer: 43 mL/min — ABNORMAL LOW (ref >60)
GFR calc non Af Amer: 37 mL/min — ABNORMAL LOW (ref >60)
Glucose, Bld: 210 mg/dL — ABNORMAL HIGH (ref 70–99)
Potassium: 4.4 mmol/L (ref 3.5–5.1)
Sodium: 138 mmol/L (ref 135–145)

## 2020-01-11 MED ORDER — METOPROLOL TARTRATE 12.5 MG HALF TABLET
12.5000 mg | ORAL_TABLET | Freq: Two times a day (BID) | ORAL | Status: DC
  Administered 2020-01-11: 12.5 mg via ORAL
  Filled 2020-01-11: qty 1

## 2020-01-11 MED ORDER — METOPROLOL TARTRATE 25 MG PO TABS: 12.5000 mg | ORAL_TABLET | Freq: Two times a day (BID) | ORAL | 0 refills | Status: DC

## 2020-01-11 NOTE — Progress Notes (Signed)
Inpatient Diabetes Program Recommendations  AACE/ADA: New Consensus Statement on Inpatient Glycemic Control   Target Ranges:  Prepandial:   less than 140 mg/dL      Peak postprandial:   less than 180 mg/dL (1-2 hours)      Critically ill patients:  140 - 180 mg/dL   Results for VASSIE, KUGEL (MRN [id number]) as of 01/11/2020 11:12  Ref. Range 12/24/2019 09:56 01/09/2020 13:07 01/10/2020 09:06 01/10/2020 11:41 01/10/2020 16:51 01/10/2020 21:02 01/11/2020 08:06  Glucose-Capillary Latest Ref Range: 70 - 99 mg/dL 181 (H) 213 (H) 119 (H) 275 (H) 282 (H) 137 (H) 239 (H)   Review of Glycemic Control  Diabetes history: DM2 Outpatient Diabetes medications: Lantus 40 units daily, Metformin 500 mg BID Current orders for Inpatient glycemic control: Lantus 30 units daily, Novolog 0-9 units TID with meals  Inpatient Diabetes Program Recommendations:   Correction (SSI): Please consider ordering Novolog 0-5 units QHS for bedtime correction.  Insulin - Meal Coverage: Please consider ordering Novolog 3 units TID with meals for meal coverage if patient eats at least 50% of meals.  Thanks, Barnie Alderman, RN, MSN, CDE Diabetes Coordinator Inpatient Diabetes Program [phone] (Team Pager from 8am to 5pm)

## 2020-01-11 NOTE — Progress Notes (Signed)
Progress Note  Patient Name: [name] Date of Encounter: 01/11/2020  Attending physician: [name] Koch, MD Primary care provider: Lucia Gaskins, MD Primary Cardiologist: Dr. Vernell [name] Consultant:[name] [name]  Subjective: [name] is a 69 y.o. female whose past medical history and cardiac risk factors include: established coronary artery disease, controlled hypertension, type 2 diabetes mellitus, hyperlipidemia, former smoker, hypothyroidism, COPD/asthma, depression, admitted with hyperkalemia.  Cardiology has been consulted for evaluation of documented RCA occlusion.  Patient seen and examined at bedside at approximately 8:15 AM No events overnight. Patient denies any chest pain at rest or with effort related activities, no shortness of breath at rest or with effort related activities, no orthopnea, paroxysmal nocturnal dyspnea or lower extremity swelling. Telemetry reviewed.  Objective: Vital Signs in the last 24 hours: Temp:  [98.4 F (36.9 C)-99.5 F (37.5 C)] 98.4 F (36.9 C) (03/29 0609) Pulse Rate:  [63-71] 63 (03/29 0609) BP: (106-121)/(46-75) 121/46 (03/29 0609) SpO2:  [92 %-95 %] 95 % (03/29 0609) Weight:  [77.3 kg] 77.3 kg (03/29 0609)  Intake/Output from previous day: 03/28 0701 - 03/29 0700 In: 960 [P.O.:960] Out: 700 [Urine:700]  Weights:  Filed Weights   01/09/20 1824 01/10/20 0333 01/11/20 0609  Weight: 78.2 kg 77.9 kg 77.3 kg    Telemetry: Personally reviewed.  Physical examination: Constitutional: She is oriented to person, place, and time. She appears well-developed and well-nourished. No distress.  HENT:  Head: Normocephalic and atraumatic.  Eyes: Pupils are equal, round, and reactive to light. Conjunctivae are normal.  Neck: No JVD present.  Cardiovascular: Normal rate, regular rhythm and intact distal pulses.  Pulmonary/Chest: Effort normal. She has  expiratory wheezes. She has no rales. She exhibits no tenderness.   Abdominal: Soft. Bowel sounds are normal. There is no abdominal tenderness. There is no rebound.  Musculoskeletal:        General: No edema.     Comments: Right below wrist amputation  Lymphadenopathy:    She has no cervical adenopathy.  Neurological: She is alert and oriented to person, place, and time. No cranial nerve deficit.  Skin: Skin is warm and dry.  Psychiatric: She has a normal mood and affect.  Nursing note and vitals reviewed.  Lab Results: Hematology Recent Labs  Lab 01/10/20 0508 01/10/20 1640 01/11/20 0406  WBC 3.6* 4.9 3.9*  RBC 3.08* 3.96 3.77*  HGB 8.7* 11.1* 10.4*  HCT 27.9* 36.5 34.4*  MCV 90.6 92.2 91.2  MCH 28.2 28.0 27.6  MCHC 31.2 30.4 30.2  RDW 21.0* 21.2* 20.7*  PLT 172 195 165    Chemistry Recent Labs  Lab 01/10/20 1640 01/10/20 2253 01/11/20 0406  NA 136 136 138  K 5.3* 4.5 4.4  CL 96* 97* 99  CO2 29 29 29   GLUCOSE 284* 261* 210*  BUN 24* 25* 21  CREATININE 1.52* 1.64* 1.44*  CALCIUM 9.4 8.9 8.8*  GFRNONAA 35* 32* 37*  GFRAA 40* 37* 43*  ANIONGAP 11 10 10      Hepatic Function Panel  Recent Labs    12/19/19 1710 12/20/19 0437  PROT 7.5 7.1  ALBUMIN 3.6 3.5  AST 15 15  ALT 13 12  ALKPHOS 61 61  BILITOT 0.5 0.3    Cardiac Enzymes: No results for input(s): CKTOTAL, CKMB, RELINDX in the last 8760 hours.  Invalid input(s): TROPONIN I Lab Results  Component Value Date   CKMB <0.1 03/22/2010   TROPONINI <0.03 10/02/2015     BNPNo results for input(s): BNP, PROBNP in  the last 168 hours.   DDimer No results for input(s): DDIMER in the last 168 hours.   Hemoglobin A1c:  Lab Results  Component Value Date   HGBA1C 8.2 (H) 12/20/2019   MPG 188.64 12/20/2019    TSH No results for input(s): TSH in the last 8760 hours.  Lipid Panel     Component Value Date/Time   CHOL 142 05/30/2009 2042   TRIG 189 (H) 05/30/2009 2042   HDL 36 (L) 05/30/2009 2042   CHOLHDL 3.9 Ratio 05/30/2009 2042   VLDL 38 05/30/2009 2042    LDLCALC 68 05/30/2009 2042    Imaging: DG Chest Portable 1 View  Result Date: 01/09/2020 CLINICAL DATA:  Pt reports was discharged approx 1 week ago. Reports was [facility] for [facility] pain and was told she had a 100% blockage. Reports they weren't able to put in a stent. Persistent chest pain EXAM: PORTABLE CHEST - 1 VIEW COMPARISON:  12/31/2019 FINDINGS: 4.1 cm masslike consolidation adjacent to the minor fissure as before. No new infiltrate or overt edema. Heart size and mediastinal contours are within normal limits. Aortic Atherosclerosis (ICD10-170.0). No effusion. No pneumothorax. Visualized bones unremarkable. IMPRESSION: Persistent masslike consolidation adjacent to the minor fissure. No acute findings. Electronically Signed   By: Lucrezia Europe M.D.   On: 01/09/2020 12:03    Cardiac database: EKG  12/20/2019: Sinus rhythm. Old anterior infarct. Lateral ischemia, slightly more prominent compared to previous EKG in 2016.  01/09/2020 1054: Normal sinus rhythm with a ventricular rate of 92 bpm, normal axis deviation, ST-T changes in the high lateral, lateral leads, and inferiorly.  Without evidence of underlying myocardial injury pattern.  01/09/2020 1611: Normal sinus rhythm with a ventricular rate of 80 bpm.  Compared to prior EKG the ST-T changes have resolved.  Echocardiogram: 12/21/2019: LVEF 55 to 60%, no regional wall motion abnormalities, grade 2 diastolic impairment, mildly dilated left atrium, mild to moderate MR.  Heart catheterization: 12/24/2019:  LM: Normal LAD: Minimal luminal irregularities LCx: Prox LCx 30%, prox 50% OM1 stenosis  RCA: Prox RCA CTO with right-to right bridging collaterals, and left-to-right collaterals LVEDP 28 mmHg. Hb 7.9 g/dl.  Recommendation: She needs intervention to her prox RCA. However, need to optimize her Hb and LVEDP first  Scheduled Meds: . clopidogrel  75 mg Oral Daily  . ferrous sulfate  325 mg Oral Q breakfast  . heparin   5,000 Units Subcutaneous Q8H  . insulin aspart  0-9 Units Subcutaneous TID WC  . insulin glargine  30 Units Subcutaneous Daily  . levothyroxine  125 mcg Oral Daily  . multivitamin with minerals  1 tablet Oral Daily  . pantoprazole  40 mg Oral Daily  . PARoxetine  20 mg Oral Daily  . pravastatin  40 mg Oral Daily  . pregabalin  75 mg Oral Daily  . sodium chloride flush  3 mL Intravenous Q12H  . traZODone  100 mg Oral QHS    Continuous Infusions: . sodium chloride      PRN Meds: sodium chloride, acetaminophen **OR** acetaminophen, albuterol, LORazepam, ondansetron **OR** ondansetron (ZOFRAN) IV, oxyCODONE-acetaminophen, polyethylene glycol, sodium chloride flush   Assessment & Recommendations: 69 y.o. Caucasian female  with established coronary artery disease, benign hypertension, type 2 diabetes mellitus, hyperlipidemia, former smoker, hypothyroidism, COPD/asthma, depression, admitted secondary to hyperkalemia cardiology consulted secondary to recent finding of RCA occlusion.  Established coronary artery disease without angina pectoris and prior interventions:  Currently patient does not have anginal symptoms.  High-sensitivity troponin negative  x2  EKG findings did note ST-T changes suggestive of underlying ischemia but resolved upon treatment of hyperkalemia.  Patient remains in normal sinus rhythm on telemetry.  Currently not on antianginal therapy.    Will start Lopressor 12.5 mg p.o. twice daily.  Most recent echocardiogram illustrated preserved left ventricular systolic function.  Most recent left heart catheterization does note a proximal RCA occlusion but the patient does have collateral circulation from both the left and the right coronary system.  In the setting of recent hyperkalemia during this admission, acute kidney injury (improving), anemia (improving), and right lobe opacity / mass (needs further work up), and no anginal symptoms plus preserved left ventricular  systolic function would refrain from cardiac interventions at this time unless change in clinical status.  Her initial episode of chest discomfort could be secondary to supply demand physiology in such setting of anemia and underlying CAD.  Patient can follow up as outpatient with Dr. Vernell [name] in 2 weeks after discharge. Patient appointment rescheduled for January 28, 2020 at 11:15 AM.  Hyperkalemia:Resolved.  Currently managed by primary team.  Acute kidney injury on chronic kidney disease: Improving currently managed by primary team   Secondary diagnoses: Right middle lobe opacity: Please refer to chest x-ray from 01/09/2020.  Currently managed by primary team.  I have asked patient to follow-up with her primary care physician if this does not get addressed at today's office visit so that we have future directions prior to scheduling her coronary intervention.  Patient verbalized understanding.  Hypertension with chronic kidney disease: Continue to monitor.    Started Lopressor 12.5 mg p.o. twice daily.  Insulin-dependent diabetes mellitus type 2: Currently managed by primary team.  Mixed hyperlipidemia: Continue statin therapy.  Does not endorse myalgias.  Former smoker: Educated on importance of continued smoking cessation.  Patient's questions and concerns were addressed to her satisfaction. She voices understanding of the instructions provided during this encounter.   Upon discharge patient can follow-up with her primary cardiologist Dr. Vernell [name] at [facility]. Patient appointment rescheduled for January 28, 2020 at 11:15 AM.  This note was created using a voice recognition software as a result there may be grammatical errors inadvertently enclosed that do not reflect the nature of this encounter. Every attempt is made to correct such errors.  Rex Kras, [place], [facility]  Pager: [phone] Office: [phone]

## 2020-01-11 NOTE — Discharge Summary (Signed)
Physician Discharge Summary  [name] LDJ:[id number] DOB: Jul 08, 1951 DOA: 01/09/2020  PCP: [name] Gaskins, MD  Admit date: 01/09/2020 Discharge date: 01/11/2020  Admitted From: home Disposition:  home  Recommendations for Outpatient Follow-up:  1. Follow up with PCP in 1-2 weeks 2. Please obtain BMP/CBC in one week 3. Please follow up on the following pending results: CT lung with R middle lung mass 4 cm and needs pulmonary follow up outpatient for repeat imaging versus biopsy in this former smoker.   Home Health:none Equipment/Devices:none  Discharge Condition:improved CODE STATUS:*full Diet recommendation: Heart Healthy   Brief/Interim Summary: [name]  is a 69 y.o. female reformed smoker reformed drinker with history of COPD, diabetes type 2, hyperlipidemia, hypothyroidism, prior history of DVT not on anticoagulation, anxiety, depression , history of CAD with prior bare-metal stent to left circumflex post MI 1997, DES secondary to M1 branch in 2000, LHC on 12/24/2018 with total occlusion of proximal RCA awaiting angioplasty and stent placement, recent treatment for right middle lobe mass/pneumonia--- presents to the ED today after being called to come to ED bt her cardiologist---  had called her to come to the ED due to blood work revealing hyperkalemia, repeat BMP in the ED consistent with hyperkalemia in the 7 range PTA patient was on lisinopril 20 mg and potassium 15mq.  Potassium was treated with Lokelma and bicarb/calcium gluconate and kayexalate without immediate improvement. She was dosed 3 times with lokelma and got several insulin injections to manage until 01/10/20 when levels dropped to mid 4s. She was able to tolerate oral intake. Without any cardiac events or symptoms. She was asked to stop potassium supplement and lisinopril on discharge. Will need labs and potentially restart potassium supplement depending on levels. Would not recommend to resume ACE-I/ARB.  Throughout course renal function normalized to baseline. She did have mass RML lung which was found on recent imaging during recent hospital. May need pulmonary referral for outpatient imaging and or biopsy in this former smoker.   Discharge Diagnoses:  Principal Problem:   Chest pain Active Problems:   Hypothyroidism   Insulin dependent type 2 diabetes mellitus (HCC)   Coronary artery disease/Prior Stents 1997 and 2000/RCA occlusion 12/2019   COPD (chronic obstructive pulmonary disease) (HCC)   DIARRHEA, CHRONIC   Hyperkalemia   AKI (acute kidney injury) ([facility]   CKD (chronic kidney disease), stage IIIa   Mass of middle lobe of right lung--??? PNA   Discharge Instructions  Discharge Instructions    Call MD for:  persistant nausea and vomiting   Complete by: As directed    Diet - low sodium heart healthy   Complete by: As directed    Increase activity slowly   Complete by: As directed      Allergies as of 01/11/2020      Reactions   Iohexol     Desc: CHEST TIGHTNESS,BRETHING PROBLEMS NEEDS PRE MEDS   Tape Other (See Comments)   Tears skin   Ciprofloxacin Rash   Latex Rash   GLOVES   Penicillins Swelling   Did it involve swelling of the face/tongue/throat, SOB, or low BP? Yes Did it involve sudden or severe rash/hives, skin peeling, or any reaction on the inside of your mouth or nose? No Did you need to seek medical attention at a hospital or doctor's office? Yes When did it last happen?1996 If all above answers are "NO", may proceed with cephalosporin use.   Povidone-iodine Rash      Medication List  STOP taking these medications   lisinopril 20 MG tablet Commonly known as: ZESTRIL   Potassium Chloride ER 20 MEQ Tbcr     TAKE these medications   albuterol 108 (90 Base) MCG/ACT inhaler Commonly known as: VENTOLIN HFA Inhale 2 puffs into the lungs every 4 (four) hours as needed for wheezing or shortness of breath.   aspirin-acetaminophen-caffeine  250-250-65 MG tablet Commonly known as: EXCEDRIN MIGRAINE Take 2 tablets by mouth every 6 (six) hours as needed for headache.   clopidogrel 75 MG tablet Commonly known as: PLAVIX Take 1 tablet (75 mg total) by mouth daily.   ferrous sulfate 325 (65 FE) MG tablet Take 1 tablet (325 mg total) by mouth daily with breakfast. What changed: when to take this   furosemide 40 MG tablet Commonly known as: LASIX Take 1 tablet (40 mg total) by mouth 2 (two) times daily.   Lantus SoloStar 100 UNIT/ML Solostar Pen Generic drug: insulin glargine Inject 40 Units into the skin daily.   levothyroxine 125 MCG tablet Commonly known as: SYNTHROID Take 125 mcg by mouth daily.   LORazepam 1 MG tablet Commonly known as: ATIVAN Take 1 mg by mouth 2 (two) times daily as needed for anxiety.   metFORMIN 500 MG tablet Commonly known as: GLUCOPHAGE Take 500 mg by mouth 2 (two) times daily with a meal.   metoprolol tartrate 25 MG tablet Commonly known as: LOPRESSOR Take 0.5 tablets (12.5 mg total) by mouth 2 (two) times daily.   multivitamin with minerals tablet Take 1 tablet by mouth daily.   oxyCODONE-acetaminophen 5-325 MG tablet Commonly known as: PERCOCET/ROXICET Take 1-2 tablets by mouth every 4 (four) hours as needed for severe pain.   pantoprazole 40 MG tablet Commonly known as: PROTONIX Take 40 mg by mouth daily.   PARoxetine 20 MG tablet Commonly known as: PAXIL Take 20 mg by mouth daily.   pravastatin 40 MG tablet Commonly known as: PRAVACHOL Take 40 mg by mouth daily.   pregabalin 75 MG capsule Commonly known as: LYRICA Take 75 mg by mouth daily.   traZODone 100 MG tablet Commonly known as: DESYREL Take 100 mg by mouth at bedtime.      Follow-up Information    [name] Mormon, MD Follow up on 01/28/2020.   Specialties: Cardiology, Radiology Why: Patient appointment rescheduled for January 28, 2020 at 11:15 AM. Contact information: [place] [address] [phone]          Allergies  Allergen Reactions  . Iohexol      Desc: CHEST TIGHTNESS,BRETHING PROBLEMS NEEDS PRE MEDS   . Tape Other (See Comments)    Tears skin  . Ciprofloxacin Rash  . Latex Rash    GLOVES   . Penicillins Swelling    Did it involve swelling of the face/tongue/throat, SOB, or low BP? Yes Did it involve sudden or severe rash/hives, skin peeling, or any reaction on the inside of your mouth or nose? No Did you need to seek medical attention at a hospital or doctor's office? Yes When did it last happen?1996 If all above answers are "NO", may proceed with cephalosporin use.  . Povidone-Iodine Rash    Consultations:  Cardiology  Procedures/Studies: DG Chest 2 View  Result Date: 12/31/2019 CLINICAL DATA:  Weakness, low blood pressure EXAM: CHEST - 2 VIEW COMPARISON:  12/21/2019 FINDINGS: Mild cardiomegaly. Unchanged masslike opacity of the right midlung. Unchanged mild, diffuse interstitial opacity. Disc degenerative disease of the thoracic spine. IMPRESSION:  1. Unchanged mild, diffuse interstitial opacity, which may reflect edema. No new airspace opacity. 2.  Unchanged masslike opacity of the right midlung. Electronically Signed   By: Eddie Candle M.D.   On: 12/31/2019 16:39   CT Chest Wo Contrast  Result Date: 12/19/2019 CLINICAL DATA:  69 year old female with shortness of breath and dizziness. EXAM: CT CHEST WITHOUT CONTRAST TECHNIQUE: Multidetector CT imaging of the chest was performed following the standard protocol without IV contrast. COMPARISON:  Chest radiograph dated 12/19/2019. FINDINGS: Evaluation of this exam is limited in the absence of intravenous contrast. Cardiovascular: There is no cardiomegaly or pericardial effusion. There is 3 vessel coronary vascular calcification. There is hypoattenuation of the cardiac blood pool suggestive of a degree of anemia. Clinical correlation is recommended. Moderate atherosclerotic  calcification of the thoracic aorta. The central pulmonary arteries are grossly unremarkable. Mediastinum/Nodes: Mildly enlarged bilateral hilar and mediastinal lymph nodes measure approximately 14 mm in short axis in the prevascular space and 18 mm in short axis in the subcarinal region. Evaluation of the hilar lymph nodes is limited in the absence of intravenous contrast. There is however fullness of the hilar region likely combination of vascular confluence and enlarged lymph nodes. There is an apparent right hilar adenopathy measuring 14 mm in short axis. The esophagus and the thyroid gland are grossly unremarkable. No mediastinal fluid collection. Lungs/Pleura: There are small bilateral pleural effusions, right greater than left. There is diffuse interstitial and interlobular septal prominence consistent with mild edema. There is background of chronic interstitial coarsening. There is a 4.6 x 4.0 cm focal area of consolidation in the right middle lobe corresponding to the opacity seen on the earlier radiograph. This likely represent pneumonia. However, a mass or malignancy is not excluded. Clinical correlation and close follow-up after treatment and resolution of acute symptoms recommended. There is no pneumothorax. The central airways are patent. Upper Abdomen: Cirrhosis. Cholecystectomy. Small ascites. Musculoskeletal: Degenerative changes of the spine. No acute osseous pathology. IMPRESSION: 1. Focal consolidation in the right middle lobe most consistent with pneumonia. A mass or malignancy is not excluded. Clinical correlation and close follow-up after treatment and resolution of acute symptoms recommended. 2. Small bilateral pleural effusions and findings of interstitial edema. 3. Mildly enlarged bilateral hilar and mediastinal lymph nodes, likely reactive. 4. Cirrhosis with small ascites. 5. Aortic Atherosclerosis (ICD10-I70.0). Electronically Signed   By: Anner Crete M.D.   On: 12/19/2019 18:23    CT Angio Chest PE W/Cm &/Or Wo Cm  Result Date: 12/19/2019 CLINICAL DATA:  Shortness of breath. EXAM: CT ANGIOGRAPHY CHEST WITH CONTRAST TECHNIQUE: Multidetector CT imaging of the chest was performed using the standard protocol during bolus administration of intravenous contrast. Multiplanar CT image reconstructions and MIPs were obtained to evaluate the vascular anatomy. CONTRAST:  43m OMNIPAQUE IOHEXOL 350 MG/ML SOLN Patient pre-medicated due to reported contrast allergy. COMPARISON:  Noncontrast chest CT earlier this day. FINDINGS: Cardiovascular: There are no filling defects within the pulmonary arteries to suggest pulmonary embolus. Aortic atherosclerosis without dissection. No pericardial effusion. Mediastinum/Nodes: Again seen bilateral hilar and mediastinal adenopathy. Representative prevascular node measures 14 mm short axis, subcarinal node measures 17 mm short axis. Enlarged right hilar node measures 17 mm short axis. Prominent left hilar nodes to a lesser extent. No esophageal wall thickening. No thyroid nodule. Lungs/Pleura: Small bilateral pleural effusions, right greater than left, unchanged from earlier this day. Interstitial and septal thickening, basilar and subpleural predominance, also unchanged. Focal consolidation in the right middle lobe measuring 4.2 x  3.8 cm abutting the fissure, unchanged. Mild central bronchial thickening. Minimal emphysema. Upper Abdomen: Nodular hepatic contours consistent with cirrhosis. Cholecystectomy. Atherosclerosis of the upper abdominal aorta. Minimal perihepatic ascites again seen. Musculoskeletal: There are no acute or suspicious osseous abnormalities. Again seen degenerative change of the spine. Review of the MIP images confirms the above findings. IMPRESSION: 1. No pulmonary embolus. 2. Exam is otherwise unchanged from noncontrast CT earlier this day. Focal masslike consolidation in the right middle lobe which may represent pneumonia, however  radiographic follow-up recommended to document resolution and exclude underlying neoplasm. 3. Small pleural effusions and findings of pulmonary edema. Enlarged mediastinal and hilar lymph nodes, likely reactive but nonspecific in the setting. 4. Cirrhosis. Minimal perihepatic ascites. Aortic Atherosclerosis (ICD10-I70.0). Electronically Signed   By: Keith Rake M.D.   On: 12/19/2019 23:01   CARDIAC CATHETERIZATION  Result Date: 12/24/2019 LM: Normal LAD: Minimal luminal irregularities LCx: Prox LCx 30%, prox 50% OM1 stenosis     RCA: Prox RCA CTO with right-to right bridging collaterals, and left-to-right collaterals LVEDP 28 mmHg. Hb 7.9 g/dl.  Recommendation: She needs intervention to her prox RCA. However, need to optimize her Hb and LVEDP first. I gave her the option of optimizing over the weekend and intervening on Monday/Tuesday. However, patient wants to go home today. I recommend the following prior to discharge.  1 U PRBC to optimize Hb. Iron supplementation on discharge. If okay with GI, start plavix and evaluate for any further bleeding, prior to coronary stenting. IV lasix 40 mg ordered, discharge on lasix 40 PO bid.  I will arrange repeat CBC next week and outpatient follow up. Tentatively, plan for planned coronary intervention on 3/23.  Discussed with hospitalist Dr. Tawanna Solo. [name] Mormon, MD [facility]. [place] Pager: [phone] Office: [phone]    DG Chest Portable 1 View  Result Date: 01/09/2020 CLINICAL DATA:  Pt reports was discharged approx 1 week ago. Reports [facility] for [facility] and was told she had a 100% blockage. Reports they weren't able to put in a stent. Persistent chest pain EXAM: PORTABLE CHEST - 1 VIEW COMPARISON:  12/31/2019 FINDINGS: 4.1 cm masslike consolidation adjacent to the minor fissure as before. No new infiltrate or overt edema. Heart size and mediastinal contours are within normal limits. Aortic Atherosclerosis  (ICD10-170.0). No effusion. No pneumothorax. Visualized bones unremarkable. IMPRESSION: Persistent masslike consolidation adjacent to the minor fissure. No acute findings. Electronically Signed   By: Lucrezia Europe M.D.   On: 01/09/2020 12:03   DG Chest Port 1 View  Result Date: 12/21/2019 CLINICAL DATA:  Shortness of breath. EXAM: PORTABLE CHEST 1 VIEW COMPARISON:  December 19, 2019. FINDINGS: Stable cardiomediastinal silhouette. Atherosclerosis of thoracic aorta is noted. No pneumothorax or pleural effusion is noted. Mild bibasilar subsegmental atelectasis is noted. Stable right middle lobe density is noted concerning for pneumonia, atelectasis or mass. Bony thorax is unremarkable. IMPRESSION: Stable right middle lobe density is noted concerning for pneumonia, atelectasis or mass. Continued radiographic follow-up is recommended. Mild bibasilar subsegmental atelectasis is noted. Aortic Atherosclerosis (ICD10-I70.0). Electronically Signed   By: Marijo Conception M.D.   On: 12/21/2019 13:46   DG Chest Port 1 View  Result Date: 12/19/2019 CLINICAL DATA:  69 year old female with shortness of breath. EXAM: PORTABLE CHEST 1 VIEW COMPARISON:  Chest radiograph dated 10/02/2015. FINDINGS: There is mild cardiomegaly and mild vascular congestion. Background of chronic interstitial coarsening and mild bronchitic changes. Faint diffuse bilateral peripheral and subpleural hazy densities may represent chronic  changes or mild edema. Atypical infiltrate is not excluded. Clinical correlation is recommended. There is a 2.8 x 5.0 cm ovoid opacity in the right mid lung field which may represent a round atelectasis, infiltrate, or loculated fluid within the fissure. A mass is not excluded. Clinical correlation and close follow-up after treatment and resolution of acute symptoms or further evaluation with CT is recommended. There is no pleural effusion or pneumothorax. Atherosclerotic calcification of the aorta. No acute osseous pathology.  IMPRESSION: 1. Cardiomegaly with mild vascular congestion. 2. Ovoid opacity in the right mid lung field may represent round atelectasis, infiltrate, or loculated fluid within the fissure. Lung mass is not excluded. Clinical correlation and close follow-up after treatment and resolution of acute symptoms or further evaluation with CT is recommended. Electronically Signed   By: Anner Crete M.D.   On: 12/19/2019 17:13   ECHOCARDIOGRAM COMPLETE  Result Date: 12/22/2019    ECHOCARDIOGRAM REPORT   Patient Name:   [name] Date of Exam: 12/21/2019 Medical Rec #:  [id number]       Height:       66.0 in Accession #:    [id number]      Weight:       184.8 lb Date of Birth:  1951/02/02       BSA:          1.934 m Patient Age:    22 years        BP:           140/56 mmHg Patient Gender: F               HR:           86 bpm. Exam Location:  Inpatient Procedure: 2D Echo Indications:     chest pain 786.50  History:         Patient has prior history of Echocardiogram examinations, most                  recent 01/01/2013. CAD, Signs/Symptoms:Dyspnea; Risk                  Factors:Diabetes and Dyslipidemia.  Sonographer:     Johny Chess RDCS Referring Phys:  [id number] [facility] J PATWARDHAN Diagnosing Phys: Vernell Leep MD IMPRESSIONS  1. Left ventricular ejection fraction, by estimation, is 55 to 60%. The left ventricle has normal function. The left ventricle has no regional wall motion abnormalities. Left ventricular diastolic parameters are consistent with Grade II diastolic dysfunction (pseudonormalization).  2. Right ventricular systolic function is normal. The right ventricular size is normal.  3. Left atrial size was mildly dilated.  4. The mitral valve is grossly normal. Mild to moderate mitral valve regurgitation.  5. Trileaflet aortic valve with mild calcification. Mild aortic stenosis. Mean PG 9 mmHg, Vmax 2.0 m/sec, AVA 2.1 cm2.  6. Estimated RA pressure 8 mmHg. FINDINGS  Left Ventricle: Left ventricular  ejection fraction, by estimation, is 55 to 60%. The left ventricle has normal function. The left ventricle has no regional wall motion abnormalities. The left ventricular internal cavity size was normal in size. There is  no left ventricular hypertrophy. Left ventricular diastolic parameters are consistent with Grade II diastolic dysfunction (pseudonormalization). Right Ventricle: The right ventricular size is normal. No increase in right ventricular wall thickness. Right ventricular systolic function is normal. Left Atrium: Left atrial size was mildly dilated. Right Atrium: Right atrial size was normal in size. Pericardium: There is no evidence of pericardial effusion. Mitral Valve:  The mitral valve is grossly normal. Mild to moderate mitral valve regurgitation. Tricuspid Valve: The tricuspid valve is grossly normal. Tricuspid valve regurgitation is not demonstrated. Aortic Valve: The aortic valve is tricuspid. Aortic valve regurgitation is not visualized. Mild aortic stenosis is present. There is mild calcification of the aortic valve. Aortic valve mean gradient measures 9.0 mmHg. Aortic valve peak gradient measures  17.3 mmHg. Aortic valve area, by VTI measures 2.20 cm. Pulmonic Valve: The pulmonic valve was normal in structure. Pulmonic valve regurgitation is not visualized. Aorta: The aortic root is normal in size and structure. Venous: The inferior vena cava is normal in size with less than 50% respiratory variability, suggesting right atrial pressure of 8 mmHg. IAS/Shunts: The interatrial septum was not assessed.  LEFT VENTRICLE PLAX 2D LVIDd:         5.00 cm  Diastology LVIDs:         3.70 cm  LV e' lateral:   10.40 cm/s LV PW:         1.00 cm  LV E/e' lateral: 12.7 LV IVS:        0.90 cm  LV e' medial:    7.62 cm/s LVOT diam:     2.00 cm  LV E/e' medial:  17.3 LV SV:         86 LV SV Index:   45 LVOT Area:     3.14 cm  RIGHT VENTRICLE RV S prime:     14.10 cm/s TAPSE (M-mode): 2.2 cm LEFT ATRIUM              Index       RIGHT ATRIUM           Index LA diam:        4.20 cm 2.17 cm/m  RA Area:     15.40 cm LA Vol (A2C):   67.2 ml 34.75 ml/m RA Volume:   41.20 ml  21.31 ml/m LA Vol (A4C):   62.6 ml 32.37 ml/m LA Biplane Vol: 66.4 ml 34.34 ml/m  AORTIC VALVE AV Area (Vmax):    2.17 cm AV Area (Vmean):   1.98 cm AV Area (VTI):     2.20 cm AV Vmax:           208.00 cm/s AV Vmean:          143.000 cm/s AV VTI:            0.393 m AV Peak Grad:      17.3 mmHg AV Mean Grad:      9.0 mmHg LVOT Vmax:         144.00 cm/s LVOT Vmean:        90.300 cm/s LVOT VTI:          0.275 m LVOT/AV VTI ratio: 0.70  AORTA Ao Root diam: 3.00 cm MITRAL VALVE MV Area (PHT): 4.21 cm     SHUNTS MV Decel Time: 180 msec     Systemic VTI:  0.28 m MV E velocity: 132.00 cm/s  Systemic Diam: 2.00 cm MV A velocity: 124.00 cm/s MV E/A ratio:  1.06 Manish Patwardhan MD Electronically signed by Vernell Leep MD Signature Date/Time: 12/22/2019/9:06:18 AM    Final    US Abdomen Limited RUQ  Result Date: 12/21/2019 CLINICAL DATA:  Cirrhosis, search for ascites EXAM: ULTRASOUND ABDOMEN LIMITED RIGHT UPPER QUADRANT COMPARISON:  CT chest 02/18/2020 FINDINGS: Gallbladder: Surgically absent Common bile duct: Diameter: 0.5 cm Liver: No focal lesion identified. Nodular contour with diffuse coarse echotexture. Portal  vein is patent on color Doppler imaging with normal direction of blood flow towards the liver. There is trace perihepatic ascites. Other: Small right pleural effusion. IMPRESSION: 1.  Trace perihepatic ascites. 2.  Cirrhotic liver. 3.  Small right pleural effusion. Electronically Signed   By: Audie Pinto M.D.   On: 12/21/2019 12:56   Subjective: Feeling well on day of discharge wants to go home. Eating and sleeping well. Denies chest pains, SOB, abdominal pain. Denies nausea or vomiting or constipation or diarrhea.   Discharge Exam: Vitals:   01/11/20 0609 01/11/20 1011  BP: (!) 121/46 (!) 125/51  Pulse: 63 63  Resp:    Temp:  98.4 F (36.9 C)   SpO2: 95%    Vitals:   01/10/20 1441 01/10/20 2042 01/11/20 0609 01/11/20 1011  BP: (!) 110/48 106/75 (!) 121/46 (!) 125/51  Pulse: 71 65 63 63  Resp:      Temp: 99.5 F (37.5 C) 98.4 F (36.9 C) 98.4 F (36.9 C)   TempSrc: Oral Oral Oral   SpO2:  92% 95%   Weight:   77.3 kg   Height:        General: Pt is alert, awake, not in acute distress Cardiovascular: RRR, S1/S2 +, no rubs, no gallops Respiratory: CTA bilaterally, no wheezing, no rhonchi Abdominal: Soft, NT, ND, bowel sounds + Extremities: no edema, no cyanosis  The results of significant diagnostics from this hospitalization (including imaging, microbiology, ancillary and laboratory) are listed below for reference.     Microbiology: Recent Results (from the past 240 hour(s))  Respiratory Panel by RT PCR (Flu A&B, Covid) - Nasopharyngeal Swab     Status: None   Collection Time: 01/09/20  2:58 PM   Specimen: Nasopharyngeal Swab  Result Value Ref Range Status   SARS Coronavirus 2 by RT PCR NEGATIVE NEGATIVE Final    Comment: (NOTE) SARS-CoV-2 target nucleic acids are NOT DETECTED. The SARS-CoV-2 RNA is generally detectable in upper respiratoy specimens during the acute phase of infection. The lowest concentration of SARS-CoV-2 viral copies this assay can detect is 131 copies/mL. A negative result does not preclude SARS-Cov-2 infection and should not be used as the sole basis for treatment or other patient management decisions. A negative result may occur with  improper specimen collection/handling, submission of specimen other than nasopharyngeal swab, presence of viral mutation(s) within the areas targeted by this assay, and inadequate number of viral copies (<131 copies/mL). A negative result must be combined with clinical observations, patient history, and epidemiological information. The expected result is Negative. Fact Sheet for Patients:  PinkCheek.be Fact  Sheet for Healthcare Providers:  GravelBags.it This test is not yet ap proved or cleared by the [place] FDA and  has been authorized for detection and/or diagnosis of SARS-CoV-2 by FDA under an Emergency Use Authorization (EUA). This EUA will remain  in effect (meaning this test can be used) for the duration of the COVID-19 declaration under Section 564(b)(1) of the Act, 21 U.S.C. section 360bbb-3(b)(1), unless the authorization is terminated or revoked sooner.    Influenza A by PCR NEGATIVE NEGATIVE Final   Influenza B by PCR NEGATIVE NEGATIVE Final    Comment: (NOTE) The Xpert Xpress SARS-CoV-2/FLU/RSV assay is intended as an aid in  the diagnosis of influenza from Nasopharyngeal swab specimens and  should not be used as a sole basis for treatment. Nasal washings and  aspirates are unacceptable for Xpert Xpress SARS-CoV-2/FLU/RSV  testing. Fact Sheet for Patients: PinkCheek.be Fact Sheet for  Healthcare Providers: GravelBags.it This test is not yet approved or cleared by the [place] and  has been authorized for detection and/or diagnosis of SARS-CoV-2 by  FDA under an Emergency Use Authorization (EUA). This EUA will remain  in effect (meaning this test can be used) for the duration of the  Covid-19 declaration under Section 564(b)(1) of the Act, 21  U.S.C. section 360bbb-3(b)(1), unless the authorization is  terminated or revoked. Performed at [facility], [address], [place], [place] [address]      Labs: BNP (last 3 results) Recent Labs    12/19/19 1710  BNP 660.6*   Basic Metabolic Panel: Recent Labs  Lab 01/10/20 0508 01/10/20 1121 01/10/20 1640 01/10/20 2253 01/11/20 0406  NA 139 136 136 136 138  K 4.2 6.4* 5.3* 4.5 4.4  CL 84* 103 96* 97* 99  CO2 44* 24 29 29 29   GLUCOSE 750* 253* 284* 261* 210*  BUN 19 21 24* 25* 21  CREATININE 1.41* 1.60* 1.52* 1.64*  1.44*  CALCIUM 7.2* 8.8* 9.4 8.9 8.8*   Liver Function Tests: No results for input(s): AST, ALT, ALKPHOS, BILITOT, PROT, ALBUMIN in the last 168 hours. No results for input(s): LIPASE, AMYLASE in the last 168 hours. No results for input(s): AMMONIA in the last 168 hours. CBC: Recent Labs  Lab 01/08/20 0850 01/09/20 1057 01/10/20 0508 01/10/20 1640 01/11/20 0406  WBC 6.0 6.1 3.6* 4.9 3.9*  NEUTROABS  --   --   --  2.7 1.8  HGB 11.2 10.7* 8.7* 11.1* 10.4*  HCT 34.8 36.1 27.9* 36.5 34.4*  MCV 87 96.0 90.6 92.2 91.2  PLT 224 207 172 195 165   Cardiac Enzymes: No results for input(s): CKTOTAL, CKMB, CKMBINDEX, TROPONINI in the last 168 hours. BNP: Invalid input(s): POCBNP CBG: Recent Labs  Lab 01/10/20 0906 01/10/20 1141 01/10/20 1651 01/10/20 2102 01/11/20 0806  GLUCAP 119* 275* 282* 137* 239*   D-Dimer No results for input(s): DDIMER in the last 72 hours. Hgb A1c No results for input(s): HGBA1C in the last 72 hours. Lipid Profile No results for input(s): CHOL, HDL, LDLCALC, TRIG, CHOLHDL, LDLDIRECT in the last 72 hours. Thyroid function studies No results for input(s): TSH, T4TOTAL, T3FREE, THYROIDAB in the last 72 hours.  Invalid input(s): FREET3 Anemia work up No results for input(s): VITAMINB12, FOLATE, FERRITIN, TIBC, IRON, RETICCTPCT in the last 72 hours. Urinalysis    Component Value Date/Time   COLORURINE YELLOW 09/20/2015 1550   APPEARANCEUR CLEAR 09/20/2015 1550   LABSPEC 1.025 09/20/2015 1550   PHURINE 5.5 09/20/2015 1550   GLUCOSEU NEGATIVE 09/20/2015 1550   HGBUR TRACE (A) 09/20/2015 1550   BILIRUBINUR NEGATIVE 09/20/2015 1550   KETONESUR NEGATIVE 09/20/2015 1550   PROTEINUR >300 (A) 09/20/2015 1550   UROBILINOGEN 0.2 09/04/2014 1841   NITRITE NEGATIVE 09/20/2015 1550   LEUKOCYTESUR NEGATIVE 09/20/2015 1550   Sepsis Labs Invalid input(s): PROCALCITONIN,  WBC,  LACTICIDVEN Microbiology Recent Results (from the past 240 hour(s))  Respiratory  Panel by RT PCR (Flu A&B, Covid) - Nasopharyngeal Swab     Status: None   Collection Time: 01/09/20  2:58 PM   Specimen: Nasopharyngeal Swab  Result Value Ref Range Status   SARS Coronavirus 2 by RT PCR NEGATIVE NEGATIVE Final    Comment: (NOTE) SARS-CoV-2 target nucleic acids are NOT DETECTED. The SARS-CoV-2 RNA is generally detectable in upper respiratoy specimens during the acute phase of infection. The lowest concentration of SARS-CoV-2 viral copies this assay can detect is 131  copies/mL. A negative result does not preclude SARS-Cov-2 infection and should not be used as the sole basis for treatment or other patient management decisions. A negative result may occur with  improper specimen collection/handling, submission of specimen other than nasopharyngeal swab, presence of viral mutation(s) within the areas targeted by this assay, and inadequate number of viral copies (<131 copies/mL). A negative result must be combined with clinical observations, patient history, and epidemiological information. The expected result is Negative. Fact Sheet for Patients:  PinkCheek.be Fact Sheet for Healthcare Providers:  GravelBags.it This test is not yet ap proved or cleared by the [place] FDA and  has been authorized for detection and/or diagnosis of SARS-CoV-2 by FDA under an Emergency Use Authorization (EUA). This EUA will remain  in effect (meaning this test can be used) for the duration of the COVID-19 declaration under Section 564(b)(1) of the Act, 21 U.S.C. section 360bbb-3(b)(1), unless the authorization is terminated or revoked sooner.    Influenza A by PCR NEGATIVE NEGATIVE Final   Influenza B by PCR NEGATIVE NEGATIVE Final    Comment: (NOTE) The Xpert Xpress SARS-CoV-2/FLU/RSV assay is intended as an aid in  the diagnosis of influenza from Nasopharyngeal swab specimens and  should not be used as a sole basis for  treatment. Nasal washings and  aspirates are unacceptable for Xpert Xpress SARS-CoV-2/FLU/RSV  testing. Fact Sheet for Patients: PinkCheek.be Fact Sheet for Healthcare Providers: GravelBags.it This test is not yet approved or cleared by the [place] FDA and  has been authorized for detection and/or diagnosis of SARS-CoV-2 by  FDA under an Emergency Use Authorization (EUA). This EUA will remain  in effect (meaning this test can be used) for the duration of the  Covid-19 declaration under Section 564(b)(1) of the Act, 21  U.S.C. section 360bbb-3(b)(1), unless the authorization is  terminated or revoked. Performed at [facility], [address] [place], [place] [address]     Time coordinating discharge: Over 30 minutes  SIGNED:  Hoyt Koch, MD  [facility] 01/11/2020, 1:55 PM Pager   If 7PM-7AM, please contact night-coverage [email or web address] Password [email or web address]

## 2020-01-12 ENCOUNTER — Ambulatory Visit: Payer: Medicare HMO | Attending: Internal Medicine

## 2020-01-12 DIAGNOSIS — Z23 Encounter for immunization: Secondary | ICD-10-CM

## 2020-01-12 NOTE — Progress Notes (Signed)
   Covid-19 Vaccination Clinic  Name:  [name]    MRN: [id number] DOB: 20-Feb-1951  01/12/2020  [name] was observed post Covid-19 immunization for 30 minutes based on pre-vaccination screening without incident. She was provided with Vaccine Information Sheet and instruction to access the V-Safe system.   [name] was instructed to call 911 with any severe reactions post vaccine: Marland Kitchen Difficulty breathing  . Swelling of face and throat  . A fast heartbeat  . A bad rash all over body  . Dizziness and weakness   Immunizations Administered    Name Date Dose VIS Date Route   Moderna COVID-19 Vaccine 01/12/2020  8:56 AM 0.5 mL 09/15/2019 Intramuscular   Manufacturer: Moderna   Lot: [id number]   [place]: [id number]

## 2020-01-14 IMAGING — DX RIGHT SECOND TOE
3 series · 3 of 3 positions shown · non-contrast
Comparison: None.

CLINICAL DATA: Continued pain with interval redness and swelling
involving the right 2nd toe after hitting the toe on a cabinet
months ago.

EXAM:
RIGHT SECOND TOE

[toe ap]
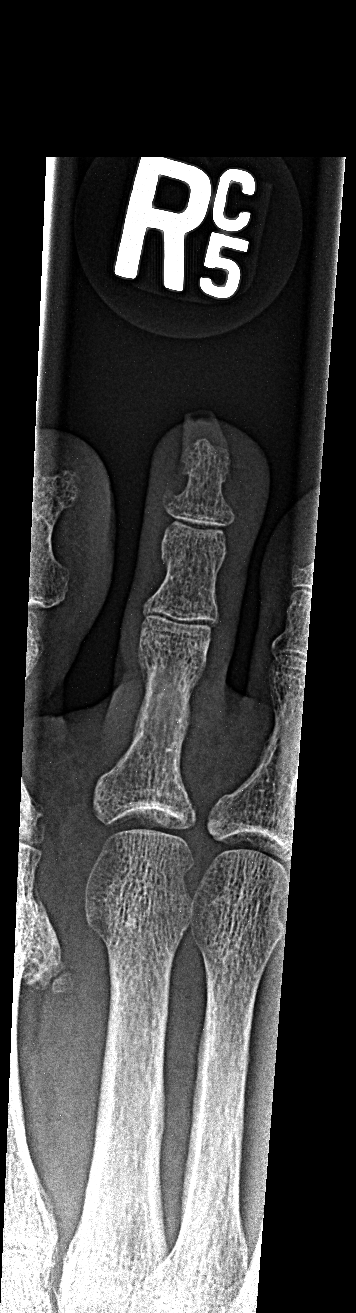

[toe obl]
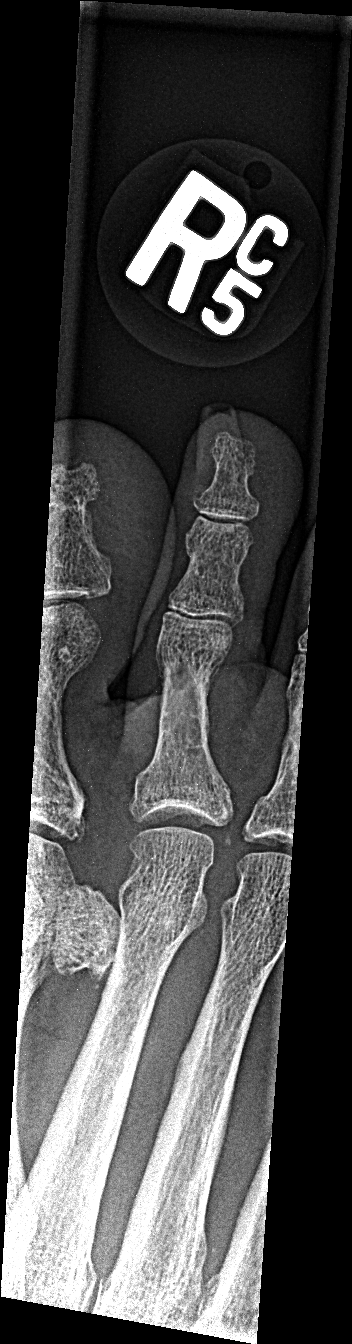

[toe lat]
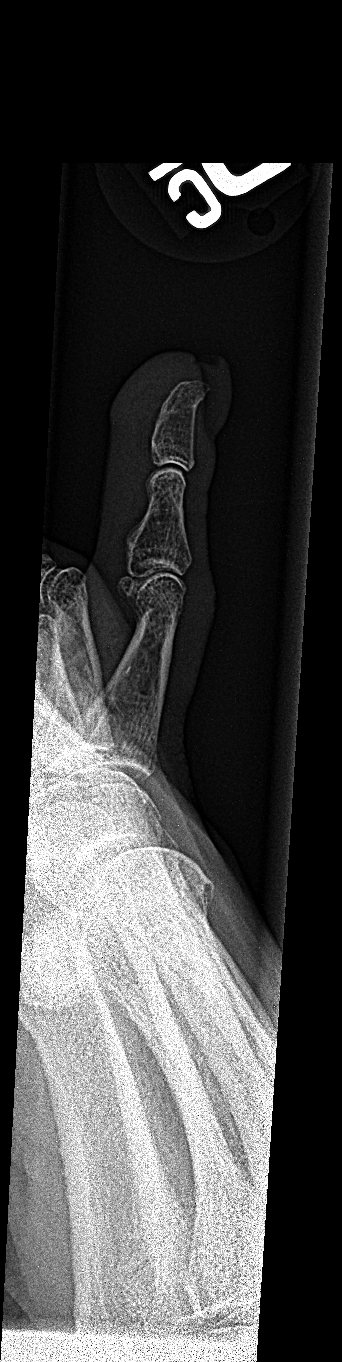

[3 of 3 positions shown; findings below may reference images not displayed]

FINDINGS: There is no evidence of fracture or dislocation. There is no
evidence of arthropathy or other focal bone abnormality. Soft
tissues are unremarkable.
IMPRESSION: Normal examination.

## 2020-01-28 ENCOUNTER — Other Ambulatory Visit: Payer: Self-pay

## 2020-01-28 ENCOUNTER — Ambulatory Visit: Payer: Medicare HMO | Admitting: Cardiology

## 2020-01-28 ENCOUNTER — Encounter: Payer: Self-pay | Admitting: Cardiology

## 2020-01-28 VITALS — Temp 95.5°F | Resp 16 | Ht 66.0 in | Wt 172.0 lb

## 2020-01-28 DIAGNOSIS — I25118 Atherosclerotic heart disease of native coronary artery with other forms of angina pectoris: Secondary | ICD-10-CM

## 2020-01-28 DIAGNOSIS — R918 Other nonspecific abnormal finding of lung field: Secondary | ICD-10-CM

## 2020-01-28 MED ORDER — ROSUVASTATIN CALCIUM 20 MG PO TABS: 20.0000 mg | ORAL_TABLET | Freq: Every day | ORAL | 2 refills | Status: DC

## 2020-01-28 MED ORDER — METOPROLOL TARTRATE 25 MG PO TABS: 12.5000 mg | ORAL_TABLET | Freq: Two times a day (BID) | ORAL | 2 refills | Status: DC

## 2020-01-28 NOTE — Progress Notes (Signed)
Follow up visit  Subjective:   [name], female    DOB: 1951/04/06, 69 y.o.   MRN: [id number]   HPI   Chief Complaint  Patient presents with  . Follow-up    Hospital f/u    69 y.o. Caucasian female  with CAD (RCA CTO),  controlled hypertension, type 2 diabetes mellitus, h/o GI Bleed, hyperlipidemia, former smoker, hypothyroidism, COPD/asthma, depression, lung mass.  Patient was hospitalized in March 2021 with complaints of chest pain and shortness of breath.  She did not have an ACS during this admission, with normal high-sensitivity troponins.  Right middle lobe mass was seen again on chest x-ray in spite of completing treatment for pneumonia.  Patient was recommended outpatient follow-up pulmonology with possibility of performing a biopsy.  Patient is here for follow-up visit. She is looking to establish care with PCP Dr. Nevada Crane (Appt in May). She is awaiting referral to pulmonology. She has not had any recurrent chest pain or shortness of breath. However, she continues to have recurrent melena. She has an upcoming appt with gastroenterologist Dr. Gala Romney in [place].    Current Outpatient Medications on File Prior to Visit  Medication Sig Dispense Refill  . albuterol (PROVENTIL HFA;VENTOLIN HFA) 108 (90 BASE) MCG/ACT inhaler Inhale 2 puffs into the lungs every 4 (four) hours as needed for wheezing or shortness of breath. 1 Inhaler 0  . aspirin-acetaminophen-caffeine (EXCEDRIN MIGRAINE) 250-250-65 MG tablet Take 2 tablets by mouth every 6 (six) hours as needed for headache.    . clopidogrel (PLAVIX) 75 MG tablet Take 1 tablet (75 mg total) by mouth daily. 30 tablet 1  . ferrous sulfate 325 (65 FE) MG tablet Take 1 tablet (325 mg total) by mouth daily with breakfast. (Patient taking differently: Take 325 mg by mouth daily at 12 noon. ) 30 tablet 1  . furosemide (LASIX) 40 MG tablet Take 1 tablet (40 mg total) by mouth 2 (two) times daily. 30 tablet 1  . LANTUS SOLOSTAR 100  UNIT/ML Solostar Pen Inject 40 Units into the skin daily.     Marland Kitchen levothyroxine (SYNTHROID) 125 MCG tablet Take 125 mcg by mouth daily.    Marland Kitchen LORazepam (ATIVAN) 1 MG tablet Take 1 mg by mouth 2 (two) times daily as needed for anxiety.     . metFORMIN (GLUCOPHAGE) 500 MG tablet Take 500 mg by mouth 2 (two) times daily with a meal.     . metoprolol tartrate (LOPRESSOR) 25 MG tablet Take 0.5 tablets (12.5 mg total) by mouth 2 (two) times daily. 60 tablet 0  . Multiple Vitamins-Minerals (MULTIVITAMIN WITH MINERALS) tablet Take 1 tablet by mouth daily.    Marland Kitchen oxyCODONE-acetaminophen (PERCOCET/ROXICET) 5-325 MG tablet Take 1-2 tablets by mouth every 4 (four) hours as needed for severe pain. 12 tablet 0  . pantoprazole (PROTONIX) 40 MG tablet Take 40 mg by mouth daily.    Marland Kitchen PARoxetine (PAXIL) 20 MG tablet Take 20 mg by mouth daily.     . pravastatin (PRAVACHOL) 40 MG tablet Take 40 mg by mouth daily.      . pregabalin (LYRICA) 75 MG capsule Take 75 mg by mouth daily.     . traZODone (DESYREL) 100 MG tablet Take 100 mg by mouth at bedtime.      No current facility-administered medications on file prior to visit.    Cardiovascular & other pertient studies:  Coronary angiography 12/24/2019: LM: Normal LAD: Minimal luminal irregularities LCx: Prox LCx 30%, prox 50% OM1 stenosis  RCA: Prox RCA CTO with right-to right bridging collaterals, and left-to-right collaterals  LVEDP 28 mmHg. Hb 7.9 g/dl.   Echocardiogram 12/21/2019: 1. Left ventricular ejection fraction, by estimation, is 55 to 60%. The  left ventricle has normal function. The left ventricle has no regional  wall motion abnormalities. Left ventricular diastolic parameters are  consistent with Grade II diastolic  dysfunction (pseudonormalization).  2. Right ventricular systolic function is normal. The right ventricular  size is normal.  3. Left atrial size was mildly dilated.  4. The mitral valve is grossly normal. Mild to moderate  mitral valve  regurgitation.  5. Trileaflet aortic valve with mild calcification. Mild aortic stenosis.  Mean PG 9 mmHg, Vmax 2.0 m/sec, AVA 2.1 cm2.  6. Estimated RA pressure 8 mmHg.   Recent labs: 01/11/2020: Glucose 210, BUN/Cr 21/1.44. EGFR 37. Na/K 138/4.4.  H/H 10.4/34.4. MCV 91. Platelets 165  12/29/2019: Glucose 124, BUN/Cr 29/1.67. EGFR 31. Na/K 140/4.8.  H/H 12/38. MCV 87. Platelets 290  12/23/2019: Glucose 75, BUN/Cr 19/1.1. EGFR 52. Na/K 143/3.6. Rest of the CMP normal H/H 7.9/27.1. MCV 88. Platelets 233 HbA1C 8.2%  2015: TSH 3.8normal  2010: Chol 142, TG 189, HDL 36, LDL 68    Review of Systems  Cardiovascular: Negative for chest pain, dyspnea on exertion, leg swelling, palpitations and syncope.         Vitals:   01/28/20 1031  Resp: 16  Temp: (!) 95.5 F (35.3 C)   Orthostatic VS for the past 72 hrs (Last 3 readings):  Orthostatic BP Patient Position BP Location Cuff Size Orthostatic Pulse  01/28/20 1034 113/53 Standing Left Arm Normal --  01/28/20 1033 128/55 Sitting Left Arm Normal --  01/28/20 1031 125/62 Supine Left Arm Normal 68      Body mass index is 27.76 kg/m. Filed Weights   01/28/20 1031  Weight: 172 lb (78 kg)     Objective:   Physical Exam  Constitutional: She appears well-developed and well-nourished.  Neck: No JVD present.  Cardiovascular: Normal rate, regular rhythm, normal heart sounds and intact distal pulses.  No murmur heard. Pulmonary/Chest: Effort normal and breath sounds normal. She has no wheezes. She has no rales.  Musculoskeletal:        General: No edema.     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          Assessment & Recommendations:   69 y.o. Caucasian female  with CAD (RCA CTO),  controlled hypertension, type 2 diabetes mellitus, h/o GI Bleed, hyperlipidemia, former smoker, hypothyroidism, COPD/asthma, depression, lung  mass.  CAD: No angina symptoms at this time. Given lack of symptoms, concern for ongoing melena, and undifferentiated lung mass, I do not recommend complex PCI to her RCA CTO.  Given melena, I have stopped her plavix.  Continue metoprlolol 12.5 mg bid. Switch pravastatin to rosuvastatin 20 mg daily.  I will request Dr. Delphina Cahill to check her lipid panel during her initial visit with him.  Lung mass: Awaiting establishing care with PCP Dr. Nevada Crane. I have taken the liberty to refer her to pulmonology.   Nigel Mormon, MD [facility] Pager: [phone] Office: [phone]

## 2020-02-09 ENCOUNTER — Ambulatory Visit (INDEPENDENT_AMBULATORY_CARE_PROVIDER_SITE_OTHER): Payer: Medicare HMO | Admitting: Vascular Surgery

## 2020-02-09 ENCOUNTER — Encounter: Payer: Self-pay | Admitting: Vascular Surgery

## 2020-02-09 ENCOUNTER — Other Ambulatory Visit: Payer: Self-pay

## 2020-02-09 VITALS — BP 105/57 | HR 50 | Temp 97.3°F | Resp 20 | Ht 66.0 in | Wt 165.2 lb

## 2020-02-09 DIAGNOSIS — I70213 Atherosclerosis of native arteries of extremities with intermittent claudication, bilateral legs: Secondary | ICD-10-CM | POA: Diagnosis not present

## 2020-02-09 NOTE — Progress Notes (Signed)
[facility] [place]  Patient name: [name] MRN: [id number] DOB: August 21, 1951 Sex: female  REASON FOR VISIT: Follow-up intermittent claudication  HPI: [name] is a 69 y.o. female here today for follow-up.  I saw her in our office 3 months ago.  At that time she had noninvasive studies suggesting suprasternal artery stenosis.  She does have bilateral caught calf claudication.  She is quite active and this limits her walking ability and reports that it is difficult for her to mow her yard due to this.  She does have cardiac disease and recently underwent evaluation revealing right coronary occlusion.  She has no rest pain and no tissue loss  Past Medical History:  Diagnosis Date  . Allergic rhinitis   . 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 ASCVD (arteriosclerotic cardiovascular disease)    MI in 96 requiring BMS CX; DES to M1 in 2000;normal coronary angiography in 2004  . Cholelithiasis   . COPD (chronic obstructive pulmonary disease) ([place])   . DDD (degenerative disc disease), lumbar   . Depression   . Diabetes mellitus   . Diabetes mellitus type I ([place])   . DVT (deep venous thrombosis) ([place])   . GERD (gastroesophageal reflux disease)   . Hyperlipidemia   . Hypothyroidism   . Low back pain   . Nephrolithiasis 2006   stone extraction   . Peripheral neuropathy   . Pneumonia 12/22/2019  . Sciatic pain    right  . Tobacco abuse   . Tremor     Family History  Problem Relation Age of Onset  . Depression Mother   . Bipolar disorder Mother   . Dementia Mother   . Pulmonary fibrosis Mother   . Alcohol abuse Father   . Aneurysm Father        deceased age 25, brain  . Colon cancer Paternal Grandfather        age greater than 80    SOCIAL HISTORY: Social History   Tobacco Use  . Smoking status: Former Smoker    Packs/day: 0.50    Years: 40.00    Pack years: 20.00    Types: Cigarettes   Quit date: 08/30/2017    Years since quitting: 2.4  . Smokeless tobacco: Never Used  . Tobacco comment: 1/2 pack per day  Substance Use Topics  . Alcohol use: No    Allergies  Allergen Reactions  . Iohexol      Desc: CHEST TIGHTNESS,BRETHING PROBLEMS NEEDS PRE MEDS   . Tape Other (See Comments)    Tears skin  . Ciprofloxacin Rash  . Latex Rash    GLOVES   . Penicillins Swelling    Did it involve swelling of the face/tongue/throat, SOB, or low BP? Yes Did it involve sudden or severe rash/hives, skin peeling, or any reaction on the inside of your mouth or nose? No Did you need to seek medical attention at a hospital or doctor's office? Yes When did it last happen?1996 If all above answers are "NO", may proceed with cephalosporin use.  . Povidone-Iodine Rash    Current Outpatient Medications  Medication Sig Dispense Refill  . albuterol (PROVENTIL HFA;VENTOLIN HFA) 108 (90 BASE) MCG/ACT inhaler Inhale 2 puffs into the lungs every 4 (four) hours as needed for wheezing or shortness of breath. 1 Inhaler 0  . ferrous sulfate 325 (65 FE) MG tablet Take 1 tablet (325 mg total) by mouth daily with breakfast. (Patient taking  differently: Take 325 mg by mouth daily at 12 noon. ) 30 tablet 1  . furosemide (LASIX) 40 MG tablet Take 1 tablet (40 mg total) by mouth 2 (two) times daily. 30 tablet 1  . LANTUS SOLOSTAR 100 UNIT/ML Solostar Pen Inject 40 Units into the skin daily.     Marland Kitchen levothyroxine (SYNTHROID) 125 MCG tablet Take 125 mcg by mouth daily.    Marland Kitchen LORazepam (ATIVAN) 1 MG tablet Take 1 mg by mouth 2 (two) times daily as needed for anxiety.     . metFORMIN (GLUCOPHAGE) 500 MG tablet Take 500 mg by mouth 2 (two) times daily with a meal.     . metoprolol tartrate (LOPRESSOR) 25 MG tablet Take 0.5 tablets (12.5 mg total) by mouth 2 (two) times daily. 90 tablet 2  . Multiple Vitamins-Minerals (MULTIVITAMIN WITH MINERALS) tablet Take 1 tablet by mouth daily.    Marland Kitchen oxyCODONE-acetaminophen  (PERCOCET/ROXICET) 5-325 MG tablet Take 1-2 tablets by mouth every 4 (four) hours as needed for severe pain. 12 tablet 0  . pantoprazole (PROTONIX) 40 MG tablet Take 40 mg by mouth daily.    Marland Kitchen PARoxetine (PAXIL) 20 MG tablet Take 20 mg by mouth daily.     . pregabalin (LYRICA) 75 MG capsule Take 75 mg by mouth daily.     . rosuvastatin (CRESTOR) 20 MG tablet Take 1 tablet (20 mg total) by mouth daily. 90 tablet 2  . STEGLATRO 5 MG TABS Take 1 tablet by mouth daily.    . traZODone (DESYREL) 100 MG tablet Take 100 mg by mouth at bedtime.      No current facility-administered medications for this visit.    REVIEW OF SYSTEMS:  [X]  denotes positive finding, [ ]  denotes negative finding Cardiac  Comments:  Chest pain or chest pressure:    Shortness of breath upon exertion:    Short of breath when lying flat:    Irregular heart rhythm:        Vascular    Pain in calf, thigh, or hip brought on by ambulation: x   Pain in feet at night that wakes you up from your sleep:     Blood clot in your veins:    Leg swelling:           PHYSICAL EXAM: Vitals:   02/09/20 1114  BP: (!) 105/57  Pulse: (!) 50  Resp: 20  Temp: (!) 97.3 F (36.3 C)  SpO2: 93%  Weight: 165 lb 3.2 oz (74.9 kg)  Height: 5' 6"  (1.676 m)    GENERAL: The patient is a well-nourished female, in no acute distress. The vital signs are documented above. CARDIOVASCULAR: She does not have palpable pedal pulses. PULMONARY: There is good air exchange  MUSCULOSKELETAL: There are no major deformities or cyanosis. NEUROLOGIC: No focal weakness or paresthesias are detected. SKIN: There are no ulcers or rashes noted. PSYCHIATRIC: The patient has a normal affect.  DATA:  None new  MEDICAL ISSUES: Had long discussion with patient and her friend present.  I explained that the fact that she is quit smoking is most important factor.  She did try a trial of Pletal and noted no improvement.  I suggested that she discontinue this.  She  will continue her walking program.  She is requesting a rolling walker with a seat so she can rest when she walks and I have written a prescription of this.  We will see her again on an as-needed basis and she will notify should she  have any progressive pain or tissue loss    Rosetta Posner, MD FACS [facility] Tel [phone] Pager [phone]

## 2020-02-15 ENCOUNTER — Ambulatory Visit: Payer: Medicare HMO | Admitting: Gastroenterology

## 2020-02-15 ENCOUNTER — Other Ambulatory Visit: Payer: Self-pay

## 2020-02-15 ENCOUNTER — Encounter: Payer: Self-pay | Admitting: *Deleted

## 2020-02-15 ENCOUNTER — Encounter: Payer: Self-pay | Admitting: Gastroenterology

## 2020-02-15 ENCOUNTER — Other Ambulatory Visit: Payer: Self-pay | Admitting: *Deleted

## 2020-02-15 VITALS — BP 98/60 | HR 57 | Temp 96.3°F | Ht 66.0 in | Wt 163.2 lb

## 2020-02-15 DIAGNOSIS — D509 Iron deficiency anemia, unspecified: Secondary | ICD-10-CM | POA: Diagnosis not present

## 2020-02-15 DIAGNOSIS — R197 Diarrhea, unspecified: Secondary | ICD-10-CM | POA: Diagnosis not present

## 2020-02-15 DIAGNOSIS — K7460 Unspecified cirrhosis of liver: Secondary | ICD-10-CM | POA: Insufficient documentation

## 2020-02-15 NOTE — Addendum Note (Signed)
Addended by: Cheron Every on: 02/15/2020 09:02 AM   Modules accepted: Orders

## 2020-02-15 NOTE — Progress Notes (Signed)
Primary Care Physician:  Lucia Gaskins, MD , changing to Dr. Wende Neighbors  Primary Gastroenterologist:  Garfield Cornea, MD   Chief Complaint  Patient presents with  . Encopresis    HPI:  [name] is a 69 y.o. female with COPD, DM, h/o DVT presenting for further evaluation of iron deficiency anemia and diarrhea.  Patient was last seen by our practice in 2015.   In the hospital for chest pain back in 12/2019.  Diagnosed with pneumonia.  Noted to have cirrhosis with small ascites on imaging. No prior known cirrhosis. MELD Na of 15.  Hgb noted to be in the 7-8 range. Hematochezia reported several months previous but no melena. Anemia labs c/w IDA. She was seen by [facility], Dr. Therisa Doyne. EGD/TCS completed during admission showing mild gastritis (no specimen collected), single polyp removed from colon (path-lipoma), single non-bleeding colonic angioectasia. Treated with argon plasma coagulation (APC). Friable (with contact bleeding) mucosa in the transverse colon. Clip (MR conditional) was placed.  Hemoglobin on admission (December 19, 2019) was 8.1.  At time of discharge (March 11) hemoglobin was 7.9.  Patient was readmitted on March 27 for hyperkalemia.  On March 26 her hemoglobin was 11.2, March 28 hemoglobin 8.7.  March 29 her hemoglobin was 10.4.  She reports receiving 1 unit of packed red blood cells during her first admission.  Patient states that she has been having explosive stools since January 2021.  Having to wear depends.  Frequent nocturnal incontinence.  Wakes up in the morning having had a bowel movement in her depends.  Stools are black/green, worse since on iron.  Several loose stools daily.  Has not tried any medication for it.  No warning, no abdominal cramping or urge.  Just notes stool running without warning. No abdominal pain. No n/v. Occasional heartburn. No significant weight loss. Was fluid overload at one point but now back at her dry weight..   Has to go to pulmonologist for  follow-up of abnormal chest CTs.  Concern for masslike consolidation previously noted.  She is also transitioning to new PCP, has an upcoming appointment with Dr. Wende Neighbors   Pertinent imaging:  CT chest without contrast on December 19, 2019 IMPRESSION: 1. Focal consolidation in the right middle lobe most consistent with pneumonia. A mass or malignancy is not excluded. Clinical correlation and close follow-up after treatment and resolution of acute symptoms recommended. 2. Small bilateral pleural effusions and findings of interstitial edema. 3. Mildly enlarged bilateral hilar and mediastinal lymph nodes, likely reactive. 4. Cirrhosis with small ascites. 5. Aortic Atherosclerosis (ICD10-I70.0).  CT angio chest on December 19, 2019: IMPRESSION: 1. No pulmonary embolus. 2. Exam is otherwise unchanged from noncontrast CT earlier this day. Focal masslike consolidation in the right middle lobe which may represent pneumonia, however radiographic follow-up recommended to document resolution and exclude underlying neoplasm. 3. Small pleural effusions and findings of pulmonary edema. Enlarged mediastinal and hilar lymph nodes, likely reactive but nonspecific in the setting. 4. Cirrhosis. Minimal perihepatic ascites. 5. Aortic Atherosclerosis (ICD10-I70.0).  Right upper quadrant ultrasound December 21, 2019: IMPRESSION: 1.  Trace perihepatic ascites. 2.  Cirrhotic liver. 3.  Small right pleural effusion  Current Outpatient Medications  Medication Sig Dispense Refill  . albuterol (PROVENTIL HFA;VENTOLIN HFA) 108 (90 BASE) MCG/ACT inhaler Inhale 2 puffs into the lungs every 4 (four) hours as needed for wheezing or shortness of breath. 1 Inhaler 0  . ferrous sulfate 325 (65 FE) MG tablet Take 1 tablet (325 mg total)  by mouth daily with breakfast. (Patient taking differently: Take 325 mg by mouth daily at 12 noon. ) 30 tablet 1  . furosemide (LASIX) 40 MG tablet Take 1 tablet (40 mg total) by mouth 2  (two) times daily. 30 tablet 1  . LANTUS SOLOSTAR 100 UNIT/ML Solostar Pen Inject 40 Units into the skin daily.     Marland Kitchen levothyroxine (SYNTHROID) 125 MCG tablet Take 125 mcg by mouth daily.    Marland Kitchen LORazepam (ATIVAN) 1 MG tablet Take 1 mg by mouth 2 (two) times daily as needed for anxiety.     . metFORMIN (GLUCOPHAGE) 500 MG tablet Take 500 mg by mouth 2 (two) times daily with a meal.     . metoprolol tartrate (LOPRESSOR) 25 MG tablet Take 0.5 tablets (12.5 mg total) by mouth 2 (two) times daily. 90 tablet 2  . Multiple Vitamins-Minerals (MULTIVITAMIN WITH MINERALS) tablet Take 1 tablet by mouth daily.    Marland Kitchen oxyCODONE-acetaminophen (PERCOCET/ROXICET) 5-325 MG tablet Take 1-2 tablets by mouth every 4 (four) hours as needed for severe pain. 12 tablet 0  . pantoprazole (PROTONIX) 40 MG tablet Take 40 mg by mouth daily.    Marland Kitchen PARoxetine (PAXIL) 20 MG tablet Take 20 mg by mouth daily.     . pregabalin (LYRICA) 75 MG capsule Take 75 mg by mouth daily.     . rosuvastatin (CRESTOR) 20 MG tablet Take 1 tablet (20 mg total) by mouth daily. 90 tablet 2  . STEGLATRO 5 MG TABS Take 1 tablet by mouth daily.    . traZODone (DESYREL) 100 MG tablet Take 100 mg by mouth at bedtime.      No current facility-administered medications for this visit.    Allergies as of 02/15/2020 - Review Complete 02/15/2020  Allergen Reaction Noted  . Iohexol  02/14/2005  . Tape Other (See Comments) 03/31/2014  . Ciprofloxacin Rash 02/04/2007  . Latex Rash 03/31/2014  . Penicillins Swelling 02/04/2007  . Povidone-iodine Rash 02/04/2007    Past Medical History:  Diagnosis Date  . Allergic rhinitis   . 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 ASCVD (arteriosclerotic cardiovascular disease)    MI in 96 requiring BMS CX; DES to M1 in 2000;normal coronary angiography in 2004  . Cholelithiasis   . COPD (chronic obstructive pulmonary disease) ([place])   . DDD (degenerative disc disease), lumbar   . Depression   . Diabetes  mellitus   . Diabetes mellitus type I ([place])   . DVT (deep venous thrombosis) ([place])   . GERD (gastroesophageal reflux disease)   . Hyperlipidemia   . Hypothyroidism   . Low back pain   . Nephrolithiasis 2006   stone extraction   . Peripheral neuropathy   . Pneumonia 12/22/2019  . Sciatic pain    right  . Tobacco abuse   . Tremor     Past Surgical History:  Procedure Laterality Date  . BACK SURGERY    . CHOLECYSTECTOMY    . COLONOSCOPY  01/2009   IBB:CWUGQB rectum/repeat in 5 yrs  . COLONOSCOPY N/A 04/29/2014   Dr.Rourk- attempted/incomplete colonoscopy. inadequate prep  . COLONOSCOPY N/A 05/27/2014   VQX:[id number] coli. Colonic polyps-removed as described above.Status post segmental biopsy. single tubular adenoma and random colon bx neg  . COLONOSCOPY WITH PROPOFOL N/A 12/23/2019   Procedure: COLONOSCOPY WITH PROPOFOL;  Surgeon: Ronnette Juniper, MD;  Location: [place];  Service: Gastroenterology;  Laterality: N/A;  . DILATION AND CURETTAGE OF UTERUS  1974  . ESOPHAGOGASTRODUODENOSCOPY  05/2010   Dr. Tessie Fass, erosion. 26F dilation  . ESOPHAGOGASTRODUODENOSCOPY N/A 04/29/2014   Dr.Rourk- normal esophagus s/p passage of maloney dilator. small hiatal hernia- bx= chronic inflammation.  . ESOPHAGOGASTRODUODENOSCOPY (EGD) WITH PROPOFOL N/A 12/23/2019   Procedure: ESOPHAGOGASTRODUODENOSCOPY (EGD) WITH PROPOFOL;  Surgeon: Ronnette Juniper, MD;  Location: [place];  Service: Gastroenterology;  Laterality: N/A;  . HAND AMPUTATION Right    traumatic  . HEMOSTASIS CLIP PLACEMENT  12/23/2019   Procedure: HEMOSTASIS CLIP PLACEMENT;  Surgeon: Ronnette Juniper, MD;  Location: [place];  Service: Gastroenterology;;  . HOT HEMOSTASIS N/A 12/23/2019   Procedure: HOT HEMOSTASIS (ARGON PLASMA COAGULATION/BICAP);  Surgeon: Ronnette Juniper, MD;  Location: [place];  Service: Gastroenterology;  Laterality: N/A;  . LEFT HEART CATH AND CORONARY ANGIOGRAPHY N/A 12/24/2019   Procedure: LEFT HEART CATH AND  CORONARY ANGIOGRAPHY;  Surgeon: Nigel Mormon, MD;  Location: [place] [facility];  Service: Cardiovascular;  Laterality: N/A;  . Venia Minks DILATION N/A 04/29/2014   Procedure: Venia Minks DILATION;  Surgeon: Daneil Dolin, MD;  Location: [facility];  Service: Endoscopy;  Laterality: N/A;  . PARTIAL HYSTERECTOMY  1978  . POLYPECTOMY  12/23/2019   Procedure: POLYPECTOMY;  Surgeon: Ronnette Juniper, MD;  Location: [facility];  Service: Gastroenterology;;  . SHOULDER SURGERY     Left shoulder for RTC;left arm surgery '98/left hand surgery 2001  . TOTAL ABDOMINAL HYSTERECTOMY W/ BILATERAL SALPINGOOPHORECTOMY  2002  . UMBILICAL HERNIA REPAIR  2008    Family History  Problem Relation Age of Onset  . Depression Mother   . Bipolar disorder Mother   . Dementia Mother   . Pulmonary fibrosis Mother   . Alcohol abuse Father   . Aneurysm Father        deceased age 47, brain  . Colon cancer Paternal Grandfather        age greater than 87    Social History   Socioeconomic History  . Marital status: Divorced    Spouse name: Not on file  . Number of children: 2  . Years of education: Not on file  . Highest education level: Not on file  Occupational History  . 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 Smoking status: Former Smoker    Packs/day: 0.50    Years: 40.00    Pack years: 20.00    Types: Cigarettes    Quit date: 08/30/2017    Years since quitting: 2.4  . Smokeless tobacco: Never Used  . Tobacco comment: 1/2 pack per day  Substance and Sexual Activity  . Alcohol use: No  . Drug use: No  . Sexual activity: Not Currently  Other Topics Concern  . Not on file  Social History Narrative   Married x3   Lives with husband   GED in 2005   Social Determinants of Health   Financial Resource Strain:   . Difficulty of Paying Living Expenses:   Food Insecurity:   . Worried About Charity fundraiser in the Last  Year:   . Arboriculturist in the Last Year:   Transportation Needs:   . Film/video editor (Medical):   Marland Kitchen Lack of Transportation (Non-Medical):   Physical Activity:   . Days of Exercise per Week:   . Minutes of Exercise per Session:   Stress:   . Feeling of Stress :   Social Connections:   . Frequency of Communication with Friends and  Family:   . Frequency of Social Gatherings with Friends and Family:   . Attends Religious Services:   . Active Member of Clubs or Organizations:   . Attends Archivist Meetings:   Marland Kitchen Marital Status:   Intimate Partner Violence:   . Fear of Current or Ex-Partner:   . Emotionally Abused:   Marland Kitchen Physically Abused:   . Sexually Abused:       ROS:  General: Negative for anorexia, weight loss, fever, chills, fatigue, weakness. Eyes: Negative for vision changes.  ENT: Negative for hoarseness, difficulty swallowing , nasal congestion. CV: Negative for chest pain, angina, palpitations, positive dyspnea on exertion, peripheral edema.  Respiratory: Negative for dyspnea at rest, positive dyspnea on exertion, cough, sputum, wheezing.  GI: See history of present illness. GU:  Negative for dysuria, hematuria, urinary incontinence, urinary frequency, nocturnal urination.  MS: Negative for joint pain, low back pain.  Derm: Negative for rash or itching.  Neuro: Negative for weakness, abnormal sensation, seizure, frequent headaches, memory loss, confusion.  Psych: Negative for anxiety, depression, suicidal ideation, hallucinations.  Endo: Negative for unusual weight change.  Heme: Negative for bruising or bleeding. Allergy: Negative for rash or hives.    Physical Examination:  BP 98/60   Pulse (!) 57   Temp (!) 96.3 F (35.7 C) (Temporal)   Ht 5' 6"  (1.676 m)   Wt 163 lb 3.2 oz (74 kg)   BMI 26.34 kg/m    General: Well-nourished, well-developed in no acute distress.  Head: Normocephalic, atraumatic.   Eyes: Conjunctiva pink, no  icterus. Mouth: masked Neck: Supple without thyromegaly, masses, or lymphadenopathy.  Lungs: Clear to auscultation bilaterally.  Heart: Regular rate and rhythm, no murmurs rubs or gallops.  Abdomen: Bowel sounds are normal, nontender, nondistended, no hepatosplenomegaly or masses, no abdominal bruits or    hernia , no rebound or guarding.   Rectal: Not performed Extremities: No lower extremity edema. No clubbing or deformities.  Neuro: Alert and oriented x 4 , grossly normal neurologically.  Skin: Warm and dry, no rash or jaundice.   Psych: Alert and cooperative, normal mood and affect.  Labs: Lab Results  Component Value Date   CREATININE 1.44 (H) 01/11/2020   BUN 21 01/11/2020   NA 138 01/11/2020   K 4.4 01/11/2020   CL 99 01/11/2020   CO2 29 01/11/2020   Lab Results  Component Value Date   INR 1.2 12/21/2019   INR 0.98 05/04/2011   INR 1.03 05/03/2011   Lab Results  Component Value Date   ALT 12 12/20/2019   AST 15 12/20/2019   ALKPHOS 61 12/20/2019   BILITOT 0.3 12/20/2019   Lab Results  Component Value Date   WBC 3.9 (L) 01/11/2020   HGB 10.4 (L) 01/11/2020   HCT 34.4 (L) 01/11/2020   MCV 91.2 01/11/2020   PLT 165 01/11/2020   Lab Results  Component Value Date   IRON 12 (L) 12/21/2019   TIBC 533 (H) 12/21/2019   FERRITIN 9 (L) 12/21/2019     Imaging Studies: No results found.  Impression/Plan:  Pleasant 69 year old female presenting for further evaluation of iron deficiency anemia, chronic diarrhea, recent diagnosis of cirrhosis.  Patient tells me today that actually she was unaware cirrhosis diagnosis.  She states she has been told she has had a fatty liver for some time by her PCP.  Iron deficiency anemia: Recently require blood transfusion.  Ferritin of 9.  Heme-negative stool.  Intermittent bright red blood per rectum, dark  stool on iron.  Heme-negative in March.  EGD and colonoscopy as outlined.  Nothing really to explain IDA.  Recommend small bowel  capsule endoscopy however given several abdominal surgeries, we will pursue agile study first to assess small bowel patency.  Follow-up hemoglobin now.  Chronic diarrhea: Interestingly patient has had chronic diarrhea in the past, random colon biopsies were negative in 2015.  Recent colonoscopy while inpatient back in March as outlined, no random colon biopsies done at that time.  At this point we will check stool studies.  She describes worsening of her diarrhea more recently.  If stool studies are negative, consider trial of dicyclomine, Levsin, or Imodium.  Cirrhosis: likely due to [facility] in the setting of diabetes.  Previous iron studies consistent with IDA, no evidence of iron overload.  We will check viral markers, hepatitis a and B immunity, alpha-1 antitrypsin level.  Hold off on autoimmune markers as her LFTs are currently normal.  She is aware that we will need to continue to see her on a regular basis for her cirrhosis.  She is aware that she will require imaging every 6 months.  We will plan to bring her back in 3 months for follow-up.

## 2020-02-15 NOTE — Patient Instructions (Addendum)
1. Please go for labs and stool tests as soon as you can. Once we have your stool results back, if no signs of infection we can start you on medication for the diarrhea.  2. Reviewing your history, you have also had umbilical hernia repair. I think it would be best to due the "dummy" capsule test BEFORE we have you swallow the real camera.  3. We will need to continue following you for the cirrhosis. We will see you at minimum twice per year.  4. Next follow up in 3 months.

## 2020-02-18 ENCOUNTER — Other Ambulatory Visit: Payer: Self-pay | Admitting: Gastroenterology

## 2020-02-18 ENCOUNTER — Telehealth: Payer: Self-pay | Admitting: *Deleted

## 2020-02-18 NOTE — Telephone Encounter (Signed)
Patient called back and is aware agile cancelled for Monday. I advised will call back to r/s'd. She voiced understanding.

## 2020-02-18 NOTE — Telephone Encounter (Signed)
Received call from endo. They currently do not have any AGILE Capsules and they are on back order. Threasa Beards will call so we can r/s patient once she receives them in.   LMOVM for pt.

## 2020-02-19 LAB — SPECIMEN STATUS REPORT

## 2020-02-20 LAB — COMPREHENSIVE METABOLIC PANEL
ALT: 12 IU/L (ref 0–32)
AST: 15 IU/L (ref 0–40)
Albumin/Globulin Ratio: 1.7 (ref 1.2–2.2)
Albumin: 4.3 g/dL (ref 3.8–4.8)
Alkaline Phosphatase: 68 IU/L (ref 39–117)
BUN/Creatinine Ratio: 18 (ref 12–28)
BUN: 26 mg/dL (ref 8–27)
Bilirubin Total: <0.2 mg/dL (ref 0.0–1.2)
CO2: 18 mmol/L — ABNORMAL LOW (ref 20–29)
Calcium: 10.4 mg/dL — ABNORMAL HIGH (ref 8.7–10.3)
Chloride: 106 mmol/L (ref 96–106)
Creatinine, Ser: 1.45 mg/dL — ABNORMAL HIGH (ref 0.57–1.00)
GFR calc Af Amer: 43 mL/min/{1.73_m2} — ABNORMAL LOW (ref >59)
GFR calc non Af Amer: 37 mL/min/{1.73_m2} — ABNORMAL LOW (ref >59)
Globulin, Total: 2.6 g/dL (ref 1.5–4.5)
Glucose: 110 mg/dL — ABNORMAL HIGH (ref 65–99)
Potassium: 4.8 mmol/L (ref 3.5–5.2)
Sodium: 139 mmol/L (ref 134–144)
Total Protein: 6.9 g/dL (ref 6.0–8.5)

## 2020-02-20 LAB — CBC WITH DIFFERENTIAL/PLATELET
Basophils Absolute: 0 10*3/uL (ref 0.0–0.2)
Basos: 1 %
EOS (ABSOLUTE): 0.3 10*3/uL (ref 0.0–0.4)
Eos: 3 %
Hematocrit: 36.4 % (ref 34.0–46.6)
Hemoglobin: 11.8 g/dL (ref 11.1–15.9)
Immature Grans (Abs): 0 10*3/uL (ref 0.0–0.1)
Immature Granulocytes: 0 %
Lymphocytes Absolute: 2.1 10*3/uL (ref 0.7–3.1)
Lymphs: 27 %
MCH: 30.1 pg (ref 26.6–33.0)
MCHC: 32.4 g/dL (ref 31.5–35.7)
MCV: 93 fL (ref 79–97)
Monocytes Absolute: 0.5 10*3/uL (ref 0.1–0.9)
Monocytes: 7 %
Neutrophils Absolute: 5 10*3/uL (ref 1.4–7.0)
Neutrophils: 62 %
Platelets: 212 10*3/uL (ref 150–450)
RBC: 3.92 x10E6/uL (ref 3.77–5.28)
RDW: 19.2 % — ABNORMAL HIGH (ref 11.7–15.4)
WBC: 7.9 10*3/uL (ref 3.4–10.8)

## 2020-02-20 LAB — TISSUE TRANSGLUTAMINASE, IGA: Transglutaminase IgA: <2 U/mL (ref 0–3)

## 2020-02-20 LAB — GI PROFILE, STOOL, PCR

## 2020-02-20 LAB — PROTIME-INR
INR: 1 (ref 0.9–1.2)
Prothrombin Time: 11 s (ref 9.1–12.0)

## 2020-02-20 LAB — HEPATITIS B SURFACE ANTIBODY,QUALITATIVE: Hep B Surface Ab, Qual: NONREACTIVE

## 2020-02-20 LAB — HEPATITIS B SURFACE ANTIGEN: Hepatitis B Surface Ag: NEGATIVE

## 2020-02-20 LAB — HEPATITIS C ANTIBODY: Hep C Virus Ab: <0.1 s/co ratio (ref 0.0–0.9)

## 2020-02-20 LAB — HEPATITIS A ANTIBODY, TOTAL: hep A Total Ab: NEGATIVE

## 2020-02-20 LAB — ALPHA-1-ANTITRYPSIN: A-1 Antitrypsin: 131 mg/dL (ref 101–187)

## 2020-02-20 LAB — IGA: IgA/Immunoglobulin A, Serum: 289 mg/dL (ref 87–352)

## 2020-02-21 LAB — GI PROFILE, STOOL, PCR

## 2020-02-22 ENCOUNTER — Other Ambulatory Visit: Payer: Self-pay | Admitting: Gastroenterology

## 2020-02-22 MED ORDER — DICYCLOMINE HCL 10 MG PO CAPS: 10.0000 mg | ORAL_CAPSULE | Freq: Three times a day (TID) | ORAL | 1 refills | Status: DC | PRN

## 2020-02-24 NOTE — Telephone Encounter (Signed)
Called patient. Per endo they have received agiles in. She is scheduled for 5/24 at 7:30am.instructions discussed in detail with pt. Also have been mailed. Called endo and LMOVM making aware

## 2020-03-01 ENCOUNTER — Telehealth: Payer: Self-pay

## 2020-03-01 NOTE — Telephone Encounter (Signed)
Pt called back after several attempts. Results were given to pt. Hep A & B vaccination info was mailed to pt. Pt has a f/u with [facility] in 05/2020 as directed.

## 2020-03-07 ENCOUNTER — Encounter (HOSPITAL_COMMUNITY)
Admission: RE | Disposition: A | Discharge status: Home / Self Care | Payer: Self-pay | Source: Home / Self Care | Attending: Internal Medicine

## 2020-03-07 ENCOUNTER — Ambulatory Visit (HOSPITAL_COMMUNITY)
Admission: RE | Admit: 2020-03-07 | Discharge: 2020-03-07 | Disposition: A | Discharge status: Home / Self Care | Payer: Medicare HMO | Attending: Internal Medicine | Admitting: Internal Medicine

## 2020-03-07 DIAGNOSIS — D649 Anemia, unspecified: Secondary | ICD-10-CM | POA: Diagnosis present

## 2020-03-07 HISTORY — PX: AGILE CAPSULE: SHX5420

## 2020-03-07 SURGERY — AGILE CAPSULE
Anesthesia: Monitor Anesthesia Care

## 2020-03-07 MED ORDER — SODIUM CHLORIDE 0.9 % IV SOLN: INTRAVENOUS | Status: DC

## 2020-03-08 ENCOUNTER — Telehealth: Payer: Self-pay

## 2020-03-08 ENCOUNTER — Other Ambulatory Visit: Payer: Self-pay

## 2020-03-08 ENCOUNTER — Ambulatory Visit (HOSPITAL_COMMUNITY)
Admission: RE | Admit: 2020-03-08 | Discharge: 2020-03-08 | Disposition: A | Discharge status: Home / Self Care | Payer: Medicare HMO | Source: Ambulatory Visit | Attending: Gastroenterology | Admitting: Gastroenterology

## 2020-03-08 DIAGNOSIS — D509 Iron deficiency anemia, unspecified: Secondary | ICD-10-CM | POA: Diagnosis present

## 2020-03-08 NOTE — Telephone Encounter (Signed)
Pt and her fired called today 03/08/20 @ 1:19 PM. Pt had her capsule study completed yesterday 03/07/20 and went back to [facility] today to have her Xray. Pt and her friend said pt was suppose to receive a camera today to take back with her and states pt was given a dummy pill with metal. I explained to pt and her friend that it was probably a miscommunication when instructions were given to pt.  When pt had her Xray, that was most likely the end of that procedure. Pt and her friend said if there was a miscommunication, please ask the doctor to advise. They are aware that the doctor will have to review the results when results are available.

## 2020-03-08 NOTE — Telephone Encounter (Signed)
Noted. Spoke with pt. Pt is aware of how the procedure works and will wait for her results.

## 2020-03-08 NOTE — Telephone Encounter (Signed)
Patient swallowed the dummy capsule (to make sure the real capsule can get through her small intestines) yesterday and today had abdominal film to make sure the capsule has passed.  Those results are currently pending.  If the capsule has passed, then she will be called to be scheduled for the "real" small bowel capsule study.  Hopefully xray results will be available in the next few minutes.

## 2020-03-21 ENCOUNTER — Other Ambulatory Visit: Payer: Self-pay

## 2020-03-22 ENCOUNTER — Ambulatory Visit (INDEPENDENT_AMBULATORY_CARE_PROVIDER_SITE_OTHER): Payer: Medicare HMO | Admitting: Pulmonary Disease

## 2020-03-22 ENCOUNTER — Other Ambulatory Visit: Payer: Self-pay

## 2020-03-22 ENCOUNTER — Ambulatory Visit (HOSPITAL_COMMUNITY)
Admission: RE | Admit: 2020-03-22 | Discharge: 2020-03-22 | Disposition: A | Discharge status: Home / Self Care | Payer: Medicare HMO | Attending: Internal Medicine | Admitting: Internal Medicine

## 2020-03-22 ENCOUNTER — Encounter: Payer: Self-pay | Admitting: Pulmonary Disease

## 2020-03-22 ENCOUNTER — Encounter (HOSPITAL_COMMUNITY)
Admission: RE | Disposition: A | Discharge status: Home / Self Care | Payer: Self-pay | Source: Home / Self Care | Attending: Internal Medicine

## 2020-03-22 VITALS — BP 110/68 | HR 67 | Temp 96.8°F | Ht 66.5 in | Wt 157.5 lb

## 2020-03-22 DIAGNOSIS — K529 Noninfective gastroenteritis and colitis, unspecified: Secondary | ICD-10-CM | POA: Diagnosis not present

## 2020-03-22 DIAGNOSIS — R911 Solitary pulmonary nodule: Secondary | ICD-10-CM | POA: Diagnosis not present

## 2020-03-22 DIAGNOSIS — J418 Mixed simple and mucopurulent chronic bronchitis: Secondary | ICD-10-CM

## 2020-03-22 DIAGNOSIS — K31819 Angiodysplasia of stomach and duodenum without bleeding: Secondary | ICD-10-CM | POA: Diagnosis not present

## 2020-03-22 DIAGNOSIS — D509 Iron deficiency anemia, unspecified: Secondary | ICD-10-CM | POA: Diagnosis present

## 2020-03-22 DIAGNOSIS — N189 Chronic kidney disease, unspecified: Secondary | ICD-10-CM | POA: Diagnosis not present

## 2020-03-22 HISTORY — PX: GIVENS CAPSULE STUDY: SHX5432

## 2020-03-22 SURGERY — IMAGING PROCEDURE, GI TRACT, INTRALUMINAL, VIA CAPSULE

## 2020-03-22 NOTE — Patient Instructions (Signed)
Will schedule CT chest and pulmonary function test and call with results  Follow up in 8 weeks

## 2020-03-22 NOTE — Progress Notes (Signed)
[facility]  Chief Complaint  Patient presents with   Pulmonary Consult    Referred by Donneta Romberg, NP for eval of lung mass. Pt states has had PNA multiple times in the past.     Constitutional:  BP 110/68 (BP Location: Left Arm, Cuff Size: Normal)    Pulse 67    Temp (!) 96.8 F (36 C) (Temporal)    Ht 5' 6.5" (1.689 m)    Wt 157 lb 8 oz (71.4 kg)    SpO2 94% Comment: on RA   BMI 25.04 kg/m   Past Medical History:  CAD, HTN, DM type 2, HLD, Hypothyroidism, Depression, ETOH with cirrhosis, DVT, diastolic CHF, Colon polyp, CKD 2, Neuropathy, Tremor  Summary:  [name] is a 69 y.o. female former smoker with lung mass and COPD.  Subjective:   She is here with her friend and support dog Oreo.  She was in hospital in March for anemia, chest pain, and hyperkalemia.  During that hospitalization she had CXR and then CT chest.  Found to have Rt middle lung area of consolidation/mass with mediastinal and hilar adenopathy.  She has not had repeat imaging since then.  She was having trouble with cough, wheeze, and sputum.  Her PCP started her on trelegy and this has helped.  She quit smoking 5 yrs ago.  She used to get pneumonia frequently when she was younger.  She doesn't feel like her breathing limits her activity.  She is more limited by leg pains.  She is not having chest pain, fever, gland swelling, skin rash or hemoptysis.  She has been losing weight.  Her appetite has been good.   Physical Exam:   Appearance - well kempt  ENMT - no sinus tenderness, no nasal discharge, no oral exudate, Mallampati 2  Respiratory - no wheeze, or rales  CV - regular rate and rhythm, no murmurs  GI - soft, wearing GI capsule endoscopy belt  Lymph - no adenopathy noted in neck  Ext - no edema, amputation of Rt hand  Skin - no rashes  Neuro - normal strength, oriented x 3  Psych - normal mood and affect   Assessment/Plan:   Rt middle lung  area of consolidation/mass seen on CT chest from March 2021. - will repeat CT chest w/o contrast (she has contrast allergy) - depending on results will determine if she needs bronchoscopic biopsy  History of tobacco abuse with presumed diagnosis of COPD. - she has improvement in symptoms since being on trelegy; continue this - will arrange for pulmonary function test to further assess  Hx of CAD. - followed by Dr. Virgina Jock with [facility] - might need assessment from Dr. Virgina Jock prior to her undergoing bronchoscopy if she needs this  Iron deficiency anemia, hx of cirrhosis. - f/u with GI  A total of 47 minutes addressing patient care on the day of the visit.  Follow up:  Patient Instructions  Will schedule CT chest and pulmonary function test and call with results  Follow up in 8 weeks   Signature:  Chesley Mires, MD [place] Pager: [phone] 03/22/2020, 10:43 AM  Flow Sheet     Pulmonary tests:   A1AT 02/17/20 >> 131  Chest imaging:   CT chest 12/19/19 >> enlarged b/l hilar and mediastinal LN up to 18 mm, small b/l effusions, diffuse interstitial and interlobular septal prominence, 4.6 x 4 cm area of consolidation in RML, cirrhosis with small amount  of ascites  Cardiac tests:   Echo 12/21/19 >> EF 55 to 60%, grade 2 DD, mild/mod MR  Medications:   Allergies as of 03/22/2020      Reactions   Iohexol     Desc: CHEST TIGHTNESS,BRETHING PROBLEMS NEEDS PRE MEDS   Tape Other (See Comments)   Tears skin   Ciprofloxacin Rash   Latex Rash   GLOVES   Penicillins Swelling   Did it involve swelling of the face/tongue/throat, SOB, or low BP? Yes Did it involve sudden or severe rash/hives, skin peeling, or any reaction on the inside of your mouth or nose? No Did you need to seek medical attention at a hospital or doctor's office? Yes When did it last happen?1996 If all above answers are NO, may proceed with cephalosporin use.    Povidone-iodine Rash      Medication List       Accurate as of March 22, 2020 10:43 AM. If you have any questions, ask your nurse or doctor.        STOP taking these medications   ferrous sulfate 325 (65 FE) MG tablet   LORazepam 1 MG tablet Commonly known as: ATIVAN   metoprolol tartrate 25 MG tablet Commonly known as: LOPRESSOR   Steglatro 5 MG Tabs tablet Generic drug: ertugliflozin L-PyroglutamicAc     TAKE these medications   albuterol 108 (90 Base) MCG/ACT inhaler Commonly known as: VENTOLIN HFA Inhale 2 puffs into the lungs every 4 (four) hours as needed for wheezing or shortness of breath.   dicyclomine 10 MG capsule Commonly known as: Bentyl Take 1 capsule (10 mg total) by mouth 3 (three) times daily as needed for spasms (as needed for abdominal cramps and diarrhea.).   furosemide 40 MG tablet Commonly known as: LASIX Take 1 tablet (40 mg total) by mouth 2 (two) times daily.   Lantus SoloStar 100 UNIT/ML Solostar Pen Generic drug: insulin glargine Inject 40 Units into the skin daily.   levothyroxine 125 MCG tablet Commonly known as: SYNTHROID Take 125 mcg by mouth daily.   metFORMIN 500 MG tablet Commonly known as: GLUCOPHAGE Take 500 mg by mouth 2 (two) times daily with a meal.   multivitamin with minerals tablet Take 1 tablet by mouth daily.   oxyCODONE-acetaminophen 5-325 MG tablet Commonly known as: PERCOCET/ROXICET Take 1-2 tablets by mouth every 4 (four) hours as needed for severe pain.   pantoprazole 40 MG tablet Commonly known as: PROTONIX Take 40 mg by mouth daily.   PARoxetine 20 MG tablet Commonly known as: PAXIL Take 20 mg by mouth daily.   pregabalin 75 MG capsule Commonly known as: LYRICA Take 75 mg by mouth daily.   rosuvastatin 20 MG tablet Commonly known as: CRESTOR Take 1 tablet (20 mg total) by mouth daily.   traZODone 100 MG tablet Commonly known as: DESYREL Take 100 mg by mouth at bedtime.   TRELEGY ELLIPTA  IN Inhale 1 puff into the lungs daily.       Past Surgical History:  She  has a past surgical history that includes Dilation and curettage of uterus (1974); Partial hysterectomy (1978); Total abdominal hysterectomy w/ bilateral salpingoophorectomy (2002); Shoulder surgery; Cholecystectomy; Umbilical hernia repair (2008); Colonoscopy (01/2009); Back surgery; Hand amputation (Right); Esophagogastroduodenoscopy (05/2010); Colonoscopy (N/A, 04/29/2014); Esophagogastroduodenoscopy (N/A, 04/29/2014); maloney dilation (N/A, 04/29/2014); Colonoscopy (N/A, 05/27/2014); Esophagogastroduodenoscopy (egd) with propofol (N/A, 12/23/2019); Colonoscopy with propofol (N/A, 12/23/2019); Hemostasis clip placement (12/23/2019); polypectomy (12/23/2019); Hot hemostasis (N/A, 12/23/2019); LEFT HEART CATH AND CORONARY ANGIOGRAPHY (N/A,  12/24/2019); and Agile capsule (N/A, 03/07/2020).  Family History:  Her family history includes Alcohol abuse in her father; Aneurysm in her father; Bipolar disorder in her mother; Colon cancer in her paternal grandfather; Dementia in her mother; Depression in her mother; Pulmonary fibrosis in her mother.  Social History:  She  reports that she quit smoking about 2 years ago. Her smoking use included cigarettes. She has a 20.00 pack-year smoking history. She has never used smokeless tobacco. She reports that she does not drink alcohol or use drugs.

## 2020-03-25 NOTE — Op Note (Addendum)
.  Small Bowel Givens Capsule Study Procedure date:  03/22/20  Referring Provider:  Neil Crouch, PA-C PCP:  Dr. Nevada Crane, [name], MD  Indication for procedure:  Iron deficiency anemia, chronic diarrhea.  69 y.o. female with history of chronic diarrhea also found to have IDA (Ferritin 9) in March 2021 with heme negative stool during hospitalization for pneumonia. EGD/TCS at that time with [facility] revealed mild gastritis (no specimen collected), single polyp removed from colon (path-lipoma), single non-bleeding colonic angioectasia. Treated with argon plasma coagulation (APC). Friable (with contact bleeding) mucosa in the transverse colon. Clip (MR conditional) was placed.  Report of hematochezia several months prior hospitalization.  No melena, stools black/green since starting iron. No abdominal pain or weight loss. Recent stool studies and celiac testing and May 2021 negative. Prior random colon biopsies in 2015 were negative for microscopic colitis. Givens capsule is now being pursued for complete evaluation.  Patient data:  Wt: 69.9 kg Ht: 5' 6.5"  Findings: Study complete to the cecum. Few gastric erosions and small AVM.  Few scattered small bowel erosions.  Couple of small bowel AVMs.  Possible small ulcerations at 00:55:51 and 1:29:43 (not sure if these are stuck on food particles versus ulceration).  No active GI bleeding.  No obvious masses.   First Gastric image:  00:00:24 First Duodenal image: 00:36:10 First Cecal image: 03:47:47 Gastric Passage time: 0h 4mSmall Bowel Passage time:  3h 142mSummary & Recommendations: Patient could have intermittent oozing from AVMs/erosions contributing to IDA in the setting of daily aspirin.  Unknown history of NSAID use, but these agents need to be avoided as they could contributing to her anemia via mechanism of slow GI bleeding. Notably, she also has CKD which can also contribute to IDA.   Recommend she avoid all NSAIDs, continue Protonix 40 mg  daily, continue oral iron, and follow-up in office as scheduled in August with updated CBC/iron panel to ensure stability of hemoglobin and improvement in iron panel.   No significant findings to explain her diarrhea.  Doubt Crohn's disease without typical findings.  Stool studies and celiac serologies recently negative. Diarrhea has been chronic and previously thought to be a mesh of diabetic visceral enteropathy and possibly IBS. Had been managed on WelChol several years ago.  Recommend she continue with trial of Bentyl for now.    Attending note: Pertinent images reviewed.  Agree with above assessment and recommendations.

## 2020-03-31 ENCOUNTER — Telehealth: Payer: Self-pay | Admitting: Gastroenterology

## 2020-03-31 NOTE — Telephone Encounter (Signed)
Spoke with pt. Pt notified of results and recommendations. Pt advised to avoid NSAIDS and f/u in 05/2020. Pt is aware that we will update labs at that apt.

## 2020-03-31 NOTE — Telephone Encounter (Signed)
[name], waiting on a return call.  

## 2020-03-31 NOTE — Telephone Encounter (Signed)
Please let patient know her Givens study revealed a few erosions in her small bowel/AVMs.  Suspect she may have intermittent oozing from these lesions that may be contributing to iron deficiency anemia in the setting of daily aspirin. Also suspect her chronic kidney disease is contributing to iron deficiency anemia.    Additionally, no significant findings to explain her diarrhea.  Recommend she continue with trial of Bentyl for now as Neil Crouch, PA-C had recommended.  Recommendations:  Avoid all NSAIDs including ibuprofen, Aleve, Advil, aspirin powders, naproxen, meloxicam, and anything that says "NSAID" on the package moving forward as these products will cause GI bleeding.  Continue taking Protonix 40 mg daily. Continue taking oral iron. Follow-up in office as scheduled in August.  She will need an updated CBC/iron panel at that time.

## 2020-04-04 ENCOUNTER — Telehealth: Payer: Self-pay | Admitting: Internal Medicine

## 2020-04-04 NOTE — Telephone Encounter (Signed)
Pt said she was returning a call to AM. [phone]

## 2020-04-04 NOTE — Telephone Encounter (Signed)
Spoke with pt last week. Pt listened to a previous VM. Pt is aware and communication is understood.

## 2020-04-12 ENCOUNTER — Ambulatory Visit (HOSPITAL_COMMUNITY)
Admission: RE | Admit: 2020-04-12 | Discharge: 2020-04-12 | Disposition: A | Discharge status: Home / Self Care | Payer: Medicare HMO | Source: Ambulatory Visit | Attending: Pulmonary Disease | Admitting: Pulmonary Disease

## 2020-04-12 ENCOUNTER — Other Ambulatory Visit: Payer: Self-pay

## 2020-04-12 DIAGNOSIS — R911 Solitary pulmonary nodule: Secondary | ICD-10-CM | POA: Insufficient documentation

## 2020-04-13 ENCOUNTER — Telehealth: Payer: Self-pay | Admitting: Pulmonary Disease

## 2020-04-13 DIAGNOSIS — R911 Solitary pulmonary nodule: Secondary | ICD-10-CM

## 2020-04-13 NOTE — Telephone Encounter (Signed)
CT chest 04/12/20 >> atherosclerosis, paraseptal/centrilobular, increased interstitial markings, 5.5 x 4.2 x 3.4 cm mass RML (was 4.0 x 3.7 x 2.2 cm in March 2021).   Discussed results with patient.  Will arrange for PET scan to further assess, and then determine best option for biopsy.

## 2020-04-25 ENCOUNTER — Ambulatory Visit (HOSPITAL_COMMUNITY): Payer: Medicare HMO

## 2020-05-02 ENCOUNTER — Other Ambulatory Visit: Payer: Self-pay

## 2020-05-02 ENCOUNTER — Ambulatory Visit (HOSPITAL_COMMUNITY)
Admission: RE | Admit: 2020-05-02 | Discharge: 2020-05-02 | Disposition: A | Discharge status: Home / Self Care | Payer: Medicare HMO | Source: Ambulatory Visit | Attending: Pulmonary Disease | Admitting: Pulmonary Disease

## 2020-05-02 DIAGNOSIS — R911 Solitary pulmonary nodule: Secondary | ICD-10-CM | POA: Diagnosis present

## 2020-05-02 LAB — GLUCOSE, CAPILLARY: Glucose-Capillary: 128 mg/dL — ABNORMAL HIGH (ref 70–99)

## 2020-05-02 MED ORDER — FLUDEOXYGLUCOSE F - 18 (FDG) INJECTION
7.2000 | Freq: Once | INTRAVENOUS | Status: AC | PRN
  Administered 2020-05-02: 7.2 via INTRAVENOUS

## 2020-05-05 ENCOUNTER — Telehealth: Payer: Self-pay | Admitting: Pulmonary Disease

## 2020-05-05 DIAGNOSIS — R911 Solitary pulmonary nodule: Secondary | ICD-10-CM

## 2020-05-05 NOTE — Telephone Encounter (Signed)
PET scan 05/02/20 >> Rt paratracheal LN 1.7 cm with 14.7 SUV, Rt hilar LN with 20.5 SUV, RML mass 5.8 x 4.6 cm with 17 SUV   Left message on patient's voicemail explain that she will need to get biopsy set up.  Will d/w Dr. Lamonte Sakai about best option for bronchoscopy and then schedule accordingly.

## 2020-05-06 NOTE — Telephone Encounter (Signed)
I believe that EBUS + ENB would be best strategy. Will hold off on the Nav if I get an answer on the EBUS. If patient and Dr [name] agree then [facility] to go ahead and schedule:   Diagnosis: R hilar mass and lymphadenopathy Procedure: EBUS plus ENB  Anesthesia: general Do you need Fluro? yes Priority: high Date: 8/2 thru 8/6; prefer 8/3 in endoscopy  Time: any Location: [facility] Endo or OR Does patient have OSA? DM yes. Or Latex allergy? Yes, and to tape Medication Restriction: none Anticoagulate/Antiplatelet: no Pre-op Labs Ordered: CBC, CMP, PT/INR, PTT Imaging request: most recent CT made into a superD. Either RB or Endo will pick up at [facility].   (If, SuperDimension CT Chest, please have STAT courier sent to [facility].)  Please coordinate Pre-op COVID Testing

## 2020-05-09 ENCOUNTER — Telehealth: Payer: Self-pay | Admitting: Pulmonary Disease

## 2020-05-09 NOTE — Telephone Encounter (Signed)
Pt emergency contact returning missed call from Dr. [name]. Can be reached at [phone]

## 2020-05-09 NOTE — Telephone Encounter (Signed)
Dr [name] did you reach out to this pt today by chance?

## 2020-05-10 NOTE — Telephone Encounter (Signed)
I have tried calling [name] and [name] at listed numbers several times.  Also routed to voicemail.   Please schedule a televisit with me or one of the nurse practitioners to discuss PET scan findings and next steps.

## 2020-05-10 NOTE — Telephone Encounter (Signed)
Noted  

## 2020-05-10 NOTE — Telephone Encounter (Signed)
Patient showed up at [facility] office and appt was made with SG nothing further needed at this time. Will route to SG as FYI to have details for procedure

## 2020-05-10 NOTE — Telephone Encounter (Signed)
Patient showed up at the [facility] office, staff was able to get her set up with televisit on 05/11/20 with Eric Form will let Dr. [name] know as Juluis Rainier

## 2020-05-10 NOTE — Telephone Encounter (Signed)
atc patient, unable to reach, left detailed message per DPR on her voicemail to call office to schedule televisit to discuss a plan will route to front desk pool to see if they can get ahold of patient to schedule her for a televisit

## 2020-05-10 NOTE — Telephone Encounter (Signed)
Have been unable to speak directly with pt or her advocate as of yet >> my phone calls to them are consistently routed to voicemail.  Have asked office staff to schedule patient for televisit with me or one of the NPs so that plan can be discussed.

## 2020-05-11 ENCOUNTER — Encounter: Payer: Self-pay | Admitting: Acute Care

## 2020-05-11 ENCOUNTER — Ambulatory Visit (INDEPENDENT_AMBULATORY_CARE_PROVIDER_SITE_OTHER): Payer: Medicare HMO | Admitting: Acute Care

## 2020-05-11 ENCOUNTER — Telehealth: Payer: Self-pay | Admitting: Acute Care

## 2020-05-11 ENCOUNTER — Other Ambulatory Visit: Payer: Self-pay

## 2020-05-11 DIAGNOSIS — J441 Chronic obstructive pulmonary disease with (acute) exacerbation: Secondary | ICD-10-CM

## 2020-05-11 DIAGNOSIS — Z87891 Personal history of nicotine dependence: Secondary | ICD-10-CM | POA: Diagnosis not present

## 2020-05-11 DIAGNOSIS — R918 Other nonspecific abnormal finding of lung field: Secondary | ICD-10-CM | POA: Diagnosis not present

## 2020-05-11 NOTE — Progress Notes (Signed)
Virtual Visit via Telephone Note  I connected with [name] on 05/11/20 [name]  3:00 PM EDT by telephone and verified that I am speaking with the correct person using two identifiers.  Location: Patient: [name] Provider:  [place], [place], [place], [address]    I discussed the limitations, risks, security and privacy concerns of performing an evaluation and management service by telephone and the availability of in person appointments. I also discussed with the patient that there may be a patient responsible charge related to this service. The patient expressed understanding and agreed to proceed.  Synopsis 69 year old former smoker ( Quit 2018) with lung mass and COPD. She is a patient of Dr. [name].   History of Present Illness: Follow up to discuss results of patient's PET scan.  We reviewed the patient's PET scan results. I explained that the PET scan confirmed that the right middle lobe mass was hypermetabolic on PET, and had an SUV max of 20.5. We discussed that this was concerning for bronchogenic carcinoma.  Additionally there was additional hypermetabolism of the thyroid and paratracheal lymphadenopathy. We discussed that we need to biopsy this area for a tissue diagnosis.  Dr. [name] had already spoken with Dr. Lamonte Sakai about getting the biopsy scheduled. The dates noted below are within the parameters Dr. Lamonte Sakai gave, and are ranked by the patient . She understands that it will depend on when the biopsy can be done.  August 2 Aug 5th is best for the patient August 4 th is third choice. August 3rd patient has a conflict with another MD appointment. CT Chest dated 04/12/2020 will need to be converted into a Super D Scan for the procedure.  Pt. Denies any fever, chest pain, orthopnea or hemoptysis.   Observations/Objective: PET scan of the Chest 7.19.2021 Mild hypermetabolism the thyroid. Hypermetabolic low right paratracheal lymph node measures 1.7 cm with an SUV max of  14.7. Hypermetabolic right hilar lymph node is difficult to measure on CT but has an SUV max of 20.5. Lateral segment right middle lobe mass measures approximately 4.6 x 5.8 cm with an SUV max of 17.0. New small area of consolidation in the medial right lower lobe (4/88) is mildly hypermetabolic and likely infectious or inflammatory given its relative absence on 04/12/2020.  1. 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 2. Trace bilateral pleural effusions. 3. Cirrhosis. 4. Mildly hypermetabolic thyroid.  Consider laboratory correlation. 5. Aortic atherosclerosis (ICD10-I70.0). Coronary artery calcification. 6.  Emphysema (ICD10-J43.9).   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 Plan We will get you scheduled for a biopsy with Dr. Lamonte Sakai the week of 8/2-8/6. You will get a phone call with Details. You will also need PFT's done We will call you to get this scheduled. Please call the office [name] [phone] if you have any questions, or need [place] for anything prior. Dr. Lamonte Sakai will let you know  when you need a follow up appointment.  Please contact office for sooner follow up if symptoms do not improve or worsen or seek emergency care   Order has been placed   Follow Up Instructions: Follow up week of 8/2-8/6 for biopsy. You will get a  call with details.    I discussed the assessment and treatment plan with the patient. The patient was provided an opportunity to ask questions and all were answered. The patient agreed with the plan and demonstrated an understanding of the instructions.   The patient was advised to call back or seek an in-person evaluation if the symptoms worsen or if the condition fails to improve as anticipated.  I provided 25 minutes of non-face-to-face time during this encounter.   [name], NP 05/11/2020 3:22 PM

## 2020-05-11 NOTE — Telephone Encounter (Signed)
Order have been placed for [facility] to begin scheduling

## 2020-05-11 NOTE — Telephone Encounter (Signed)
She was on my afternoon schedule. Did she see a doc in [place]?

## 2020-05-11 NOTE — Telephone Encounter (Signed)
[name], This patient has a televisit with you this afternoon at 3 o'clock.  Just FYI.  See previous notes.  Thanks

## 2020-05-11 NOTE — Addendum Note (Signed)
Addended by: Amado Coe on: 05/11/2020 04:01 PM   Modules accepted: Orders

## 2020-05-11 NOTE — Addendum Note (Signed)
Addended by: Vanessa Barbara on: 05/11/2020 03:55 PM   Modules accepted: Orders

## 2020-05-11 NOTE — Progress Notes (Signed)
Reviewed and agree with assessment/plan.   Chesley Mires, MD [facility] 05/11/2020, 5:32 PM Pager:  [phone]

## 2020-05-11 NOTE — Telephone Encounter (Signed)
Thanks so much. 

## 2020-05-11 NOTE — Telephone Encounter (Signed)
Called to schedule patient for a PFT prior to her upcoming biopsy.  Left VM for her to return our call and letting her know that we do have an opening on Thursday 05/12/20 at 4:30 pm if she would like to come in then.  She is fully covid vaccinated and will not need covid testing prior to PFT.  Will await her call.

## 2020-05-11 NOTE — Telephone Encounter (Signed)
I believe that EBUS + ENB would be best strategy. Will hold off on the Nav if I get an answer on the EBUS. If patient and Dr [name] agree then [facility] to go ahead and schedule:   Diagnosis: R hilar mass and lymphadenopathy Procedure: EBUS plus ENB  Anesthesia: general Do you need Fluro? yes Priority: high Date: 8/2 thru 8/6; prefer 8/3 in endoscopy  Time: any Location: [facility] Endo or OR Does patient have OSA? DM yes. Or Latex allergy? Yes, and to tape Medication Restriction: none Anticoagulate/Antiplatelet: no Pre-op Labs Ordered: CBC, CMP, PT/INR, PTT Imaging request: most recent CT made into a superD. Either RB or Endo will pick up at [facility].   (If, SuperDimension CT Chest, please have STAT courier sent to [facility] [address])  Please coordinate Pre-op COVID Testing   Lauren, we need to schedule this patient for biopsy. Thanks

## 2020-05-11 NOTE — Patient Instructions (Addendum)
It was good to talk with you today. We will get you scheduled for a biopsy with Dr. Lamonte Sakai the week of 8/2-8/6. You will get a phone call with Details. You will also need PFT's done We will call you to get this scheduled. Please call the office at [phone] if you have any questions, or need [place] for anything prior. Dr. Lamonte Sakai will let you know when you need a follow up appointment.  Please contact office for sooner follow up if symptoms do not improve or worsen or seek emergency care

## 2020-05-12 ENCOUNTER — Ambulatory Visit (INDEPENDENT_AMBULATORY_CARE_PROVIDER_SITE_OTHER): Payer: Medicare HMO | Admitting: Pulmonary Disease

## 2020-05-12 ENCOUNTER — Ambulatory Visit: Payer: Medicare HMO | Admitting: Adult Health

## 2020-05-12 ENCOUNTER — Other Ambulatory Visit: Payer: Self-pay

## 2020-05-12 DIAGNOSIS — R918 Other nonspecific abnormal finding of lung field: Secondary | ICD-10-CM

## 2020-05-12 LAB — PULMONARY FUNCTION TEST
DL/VA % pred: 76 %
DL/VA: 3.14 ml/min/mmHg/L
DLCO cor % pred: 58 %
DLCO cor: 12.27 ml/min/mmHg
DLCO unc % pred: 58 %
DLCO unc: 12.27 ml/min/mmHg
FEF 25-75 Pre: 1.93 L/sec
FEF2575-%Pred-Pre: 91 %
FEV1-%Pred-Pre: 87 %
FEV1-Pre: 2.2 L
FEV1FVC-%Pred-Pre: 100 %
FEV6-%Pred-Pre: 90 %
FEV6-Pre: 2.87 L
FEV6FVC-%Pred-Pre: 104 %
FVC-%Pred-Pre: 86 %
FVC-Pre: 2.87 L
Pre FEV1/FVC ratio: 77 %
Pre FEV6/FVC Ratio: 100 %

## 2020-05-12 NOTE — Progress Notes (Signed)
Spirometry and Dlco done today. 

## 2020-05-13 ENCOUNTER — Telehealth: Payer: Self-pay | Admitting: *Deleted

## 2020-05-13 NOTE — Telephone Encounter (Signed)
-----   Message from Joellen Jersey sent at 05/12/2020 12:03 PM EDT ----- Regarding: enb/ebus Enb/ebus is scheduled 05/20/20@7 :30am @[facility]  Covid test is 05/17/20@8 :45am dis is ordered from [facility] and pt is aware Joellen Jersey

## 2020-05-17 ENCOUNTER — Other Ambulatory Visit: Payer: Self-pay

## 2020-05-17 ENCOUNTER — Encounter: Payer: Self-pay | Admitting: *Deleted

## 2020-05-17 ENCOUNTER — Ambulatory Visit: Payer: Medicare HMO | Admitting: Gastroenterology

## 2020-05-17 ENCOUNTER — Telehealth: Payer: Self-pay | Admitting: *Deleted

## 2020-05-17 ENCOUNTER — Encounter: Payer: Self-pay | Admitting: Gastroenterology

## 2020-05-17 ENCOUNTER — Other Ambulatory Visit (HOSPITAL_COMMUNITY)
Admission: RE | Admit: 2020-05-17 | Discharge: 2020-05-17 | Disposition: A | Discharge status: Home / Self Care | Payer: Medicare HMO | Source: Ambulatory Visit | Attending: Emergency Medicine | Admitting: Emergency Medicine

## 2020-05-17 VITALS — BP 102/47 | HR 77 | Temp 97.3°F | Ht 67.0 in | Wt 148.6 lb

## 2020-05-17 DIAGNOSIS — R197 Diarrhea, unspecified: Secondary | ICD-10-CM | POA: Diagnosis not present

## 2020-05-17 DIAGNOSIS — K7460 Unspecified cirrhosis of liver: Secondary | ICD-10-CM

## 2020-05-17 DIAGNOSIS — Z01812 Encounter for preprocedural laboratory examination: Secondary | ICD-10-CM | POA: Diagnosis present

## 2020-05-17 DIAGNOSIS — R634 Abnormal weight loss: Secondary | ICD-10-CM

## 2020-05-17 DIAGNOSIS — D509 Iron deficiency anemia, unspecified: Secondary | ICD-10-CM | POA: Diagnosis not present

## 2020-05-17 DIAGNOSIS — R160 Hepatomegaly, not elsewhere classified: Secondary | ICD-10-CM | POA: Diagnosis not present

## 2020-05-17 DIAGNOSIS — Z20822 Contact with and (suspected) exposure to covid-19: Secondary | ICD-10-CM | POA: Insufficient documentation

## 2020-05-17 LAB — SARS CORONAVIRUS 2 (TAT 6-24 HRS): SARS Coronavirus 2: NEGATIVE

## 2020-05-17 MED ORDER — COLESTIPOL HCL 1 G PO TABS: 3.0000 g | ORAL_TABLET | Freq: Every day | ORAL | 3 refills | Status: DC

## 2020-05-17 MED ORDER — PREDNISONE 50 MG PO TABS: ORAL_TABLET | ORAL | 0 refills | Status: DC

## 2020-05-17 MED ORDER — DIPHENHYDRAMINE HCL 50 MG PO CAPS: ORAL_CAPSULE | ORAL | 0 refills | Status: DC

## 2020-05-17 NOTE — Patient Instructions (Signed)
1. Please have your labs done.  2. CT of your abdomen and blood vessels feeding your intestines. YOU NEED TO TAKE PREDNISONE 50MG 13 HOURS BEFORE, 7 HOURS BEFORE, AND 1 HOUR BEFORE YOUR CT TO PREVENT ALLERGIC REACTION. YOU ALSO NEED TO TAKE BENADRYL 50MG ONE HOUR BEFORE CT. 3. Stop dicyclomine and start Colestid 3 grams daily for diarrhea. Do not take within two hours of other medications.  4. Return to the office in four weeks.

## 2020-05-17 NOTE — Progress Notes (Signed)
Primary Care Physician: [name], MD  Primary Gastroenterologist:  Garfield Cornea, MD   Chief Complaint  Patient presents with  . Diarrhea    4 times per day, no blood in stool  . abdominal tenderness    right side  . Cirrhosis    f/u    HPI: [name] is a 69 y.o. female here for follow up. She was seen in May of 2021 for IDA and diarrhea and recent diagnosis of cirrhosis by CT.  In March 2021 when she was in the hospital for chest pain in [place]. Diagnosed with pneumonia.  Noted to have cirrhosis with small ascites on imaging. No prior known cirrhosis. MELD Na of 15.  Hgb noted to be in the 7-8 range. Hematochezia reported several months previous but no melena. Anemia labs c/w IDA. She was seen by [facility], Dr. Therisa Doyne. EGD/TCS completed during admission showing mild gastritis (no specimen collected), single polyp removed from colon (path-lipoma), single non-bleeding colonic angioectasia. Treated with argon plasma coagulation (APC). Friable (with contact bleeding) mucosa in the transverse colon. Clip (MR conditional) was placed. Hemoglobin on admission was 8.1 and at time of D/C was 7.9. she received one unit of prbcs. Patient had capsule endoscopy 03/22/20: few gastric erosions and small AVM. Few scattered small bowel erosions. Couple of small bowel AVMs.   Patient scheduled for lung biopsy on Friday. She has hypermetabolic right middle lobe mass with hypermetabolic metastatic right hilar and low right paratracheal adenopathy concerning for primary bronchogenic carcinoma.   Presents today with caregiver. She continues to complain of diarrhea. Has been worse since 10/2019 although states has been present since work up in 2015. Colonoscopy with negative random colon biopsies in 2015.  More recent colonoscopy or for anemia, no random colon biopsies obtained.  In May her GI pathogen panel was negative celiac serologies were negative.  We started her on dicyclomine, two daily  helped some but still with multiple stools daily and episodes of incontinence. 3 dicyclomine daily caused stools to be pasty and difficult to clean up and she continued to have 3-4 BM daily. Some nocturnal incontinence. No blood in stool. Caregiver notes that stools seem to be worse with stress. Her essential tremors are worse with stress. She has been anxious about the lung mass. Lost emotional support dog. States appetite has been good. Reports 30 pound weight loss since on lasix. Denies obvious peripheral edema.  She has some vague ruq soreness she feels may be related to her coughing.      Current Outpatient Medications  Medication Sig Dispense Refill  . albuterol (PROVENTIL HFA;VENTOLIN HFA) 108 (90 BASE) MCG/ACT inhaler Inhale 2 puffs into the lungs every 4 (four) hours as needed for wheezing or shortness of breath. 1 Inhaler 0  . aspirin-acetaminophen-caffeine (EXCEDRIN MIGRAINE) 250-250-65 MG tablet Take 2 tablets by mouth daily as needed for headache. Up to 4 times a week    . azelastine (OPTIVAR) 0.05 % ophthalmic solution Place 1 drop into both eyes 2 (two) times daily.    Marland Kitchen b complex vitamins tablet Take 1 tablet by mouth daily. With Zinc and vitamin C    . Benzocaine (BOIL-EASE EX) Apply 1 application topically daily as needed (Boil).    . Biotin 5000 MCG TABS Take 10,000 mcg by mouth daily. Keratin    . Cholecalciferol (VITAMIN D3) 50 MCG (2000 UT) TABS Take 4,000 Units by mouth daily.    . Cyanocobalamin 2500 MCG CHEW Chew  2,500 mcg by mouth daily.    Marland Kitchen dicyclomine (BENTYL) 10 MG capsule Take 1 capsule (10 mg total) by mouth 3 (three) times daily as needed for spasms (as needed for abdominal cramps and diarrhea.). (Patient taking differently: Take 10 mg by mouth in the morning and at bedtime. ) 90 capsule 1  . ferrous sulfate 325 (65 FE) MG tablet Take 325 mg by mouth daily with breakfast.    . furosemide (LASIX) 40 MG tablet Take 1 tablet (40 mg total) by mouth 2 (two) times daily.  30 tablet 1  . hydrocortisone cream 1 % Apply 1 application topically daily as needed for itching.    Marland Kitchen ibuprofen (ADVIL) 200 MG tablet Take 200-600 mg by mouth every 6 (six) hours as needed for moderate pain.    Marland Kitchen LANTUS SOLOSTAR 100 UNIT/ML Solostar Pen Inject 30 Units into the skin daily.     Marland Kitchen levothyroxine (SYNTHROID) 125 MCG tablet Take 125 mcg by mouth daily.    Marland Kitchen lisinopril (ZESTRIL) 20 MG tablet Take 20 mg by mouth at bedtime.    Marland Kitchen LORazepam (ATIVAN) 1 MG tablet Take 1 mg by mouth 2 (two) times daily.    . Menthol, Topical Analgesic, (ICY HOT) 7.5 % (Roll) MISC Apply 1 each topically daily as needed (Pain). spray    . metFORMIN (GLUCOPHAGE) 500 MG tablet Take 500 mg by mouth 2 (two) times daily with a meal.     . Multiple Vitamins-Minerals (MULTIVITAMIN WITH MINERALS) tablet Take 1 tablet by mouth daily. Adult 50+    . neomycin-bacitracin-polymyxin (NEOSPORIN) OINT Apply 1 application topically daily as needed for irritation or wound care.    Marland Kitchen oxyCODONE-acetaminophen (PERCOCET/ROXICET) 5-325 MG tablet Take 1-2 tablets by mouth every 4 (four) hours as needed for severe pain. (Patient taking differently: Take 0.5 tablets by mouth 2 (two) times daily as needed for severe pain. ) 12 tablet 0  . pantoprazole (PROTONIX) 40 MG tablet Take 40 mg by mouth daily.    Marland Kitchen PARoxetine (PAXIL) 20 MG tablet Take 20 mg by mouth daily.     . pravastatin (PRAVACHOL) 40 MG tablet Take 40 mg by mouth daily.    . Rimegepant Sulfate (NURTEC) 75 MG TBDP Take 75 mg by mouth daily as needed (Migarine).    . traZODone (DESYREL) 150 MG tablet Take 150 mg by mouth at bedtime.      No current facility-administered medications for this visit.    Allergies as of 05/17/2020 - Review Complete 05/17/2020  Allergen Reaction Noted  . Iohexol  02/14/2005  . Tape Other (See Comments) 03/31/2014  . Ciprofloxacin Rash 02/04/2007  . Latex Rash 03/31/2014  . Penicillins Swelling 02/04/2007  . Povidone-iodine Rash  02/04/2007   Past Surgical History:  Procedure Laterality Date  . AGILE CAPSULE N/A 03/07/2020   Procedure: AGILE CAPSULE;  Surgeon: Daneil Dolin, MD;  [facility];  Service: Endoscopy;  Laterality: N/A;  7:30am  . BACK SURGERY    . CHOLECYSTECTOMY    . COLONOSCOPY  01/2009   DGL:OVFIEP rectum/repeat in 5 yrs  . COLONOSCOPY N/A 04/29/2014   Dr.Rourk- attempted/incomplete colonoscopy. inadequate prep  . COLONOSCOPY N/A 05/27/2014   PIR:JJOACZYSA coli. Colonic polyps-removed as described above.Status post segmental biopsy. single tubular adenoma and random colon bx neg  . COLONOSCOPY WITH PROPOFOL N/A 12/23/2019   Procedure: COLONOSCOPY WITH PROPOFOL;  Surgeon: Ronnette Juniper, MD;  Location: [place];  Service: Gastroenterology;  Laterality: N/A;  . DILATION AND CURETTAGE OF UTERUS  1974  . ESOPHAGOGASTRODUODENOSCOPY  05/2010   Dr. Tessie Fass, erosion. 75F dilation  . ESOPHAGOGASTRODUODENOSCOPY N/A 04/29/2014   Dr.Rourk- normal esophagus s/p passage of maloney dilator. small hiatal hernia- bx= chronic inflammation.  . ESOPHAGOGASTRODUODENOSCOPY (EGD) WITH PROPOFOL N/A 12/23/2019   Procedure: ESOPHAGOGASTRODUODENOSCOPY (EGD) WITH PROPOFOL;  Surgeon: Ronnette Juniper, MD;  Location: [place];  Service: Gastroenterology;  Laterality: N/A;  . GIVENS CAPSULE STUDY N/A 03/22/2020   Procedure: GIVENS CAPSULE STUDY;  Surgeon: Daneil Dolin, MD;  [facility];  Service: Endoscopy;  Laterality: N/A;  7:30am  . HAND AMPUTATION Right    traumatic  . HEMOSTASIS CLIP PLACEMENT  12/23/2019   Procedure: HEMOSTASIS CLIP PLACEMENT;  Surgeon: Ronnette Juniper, MD;  Location: [place];  Service: Gastroenterology;;  . HOT HEMOSTASIS N/A 12/23/2019   Procedure: HOT HEMOSTASIS (ARGON PLASMA COAGULATION/BICAP);  Surgeon: Ronnette Juniper, MD;  Location: [place];  Service: Gastroenterology;  Laterality: N/A;  . LEFT HEART CATH AND CORONARY ANGIOGRAPHY N/A 12/24/2019   Procedure: LEFT HEART CATH AND  CORONARY ANGIOGRAPHY;  Surgeon: Nigel Mormon, MD;  Location: [place] [facility];  Service: Cardiovascular;  Laterality: N/A;  . Venia Minks DILATION N/A 04/29/2014   Procedure: Venia Minks DILATION;  Surgeon: Daneil Dolin, MD;  [facility];  Service: Endoscopy;  Laterality: N/A;  . PARTIAL HYSTERECTOMY  1978  . POLYPECTOMY  12/23/2019   Procedure: POLYPECTOMY;  Surgeon: Ronnette Juniper, MD;  Location[facility];  Service: Gastroenterology;;  . SHOULDER SURGERY     Left shoulder for RTC;left arm surgery '98/left hand surgery 2001  . TOTAL ABDOMINAL HYSTERECTOMY W/ BILATERAL SALPINGOOPHORECTOMY  2002  . UMBILICAL HERNIA REPAIR  2008    ROS:  General: Negative for anorexia, fever, chills, fatigue, weakness. See hpi ENT: Negative for hoarseness, difficulty swallowing , nasal congestion. CV: Negative for chest pain, angina, palpitations, dyspnea on exertion, peripheral edema.  Respiratory: Negative for dyspnea at rest, dyspnea on exertion,+ cough, sputum, wheezing.  GI: See history of present illness. GU:  Negative for dysuria, hematuria, urinary incontinence, urinary frequency, nocturnal urination.  Endo: Negative for unusual weight change.    Physical Examination:   BP (!) 102/47   Pulse 77   Temp (!) 97.3 F (36.3 C)   Ht 5' 7"  (1.702 m)   Wt 148 lb 9.6 oz (67.4 kg)   BMI 23.27 kg/m   General: Well-nourished, well-developed in no acute distress.  Eyes: No icterus. Mouth: Oropharyngeal mucosa moist and pink , no lesions erythema or exudate. Lungs: Clear to auscultation bilaterally.  Heart: Regular rate and rhythm, no murmurs rubs or gallops.  Abdomen: Bowel sounds are normal, nontender, nondistended, no masses, no abdominal bruits or hernia , no rebound or guarding.  Liver edge easily palpable below rcm in mcl and mildly tender to palpation.  Extremities: No lower extremity edema. No clubbing or deformities. Neuro: Alert and oriented x 4   Skin: Warm and dry, no  jaundice.   Psych: Alert and cooperative, normal mood and affect.  Labs:  Lab Results  Component Value Date   WBC 7.9 02/17/2020   HGB 11.8 02/17/2020   HCT 36.4 02/17/2020   MCV 93 02/17/2020   PLT 212 02/17/2020   Lab Results  Component Value Date   CREATININE 1.45 (H) 02/17/2020   BUN 26 02/17/2020   NA 139 02/17/2020   K 4.8 02/17/2020   CL 106 02/17/2020   CO2 18 (L) 02/17/2020   Lab Results  Component Value Date   ALT  12 02/17/2020   AST 15 02/17/2020   ALKPHOS 68 02/17/2020   BILITOT <0.2 02/17/2020   Lab Results  Component Value Date   IRON 12 (L) 12/21/2019   TIBC 533 (H) 12/21/2019   FERRITIN 9 (L) 12/21/2019      Imaging Studies: NM PET Image Initial (PI) Skull Base To Thigh  Result Date: 05/02/2020 CLINICAL DATA:  Initial treatment strategy for lung nodule. EXAM: NUCLEAR MEDICINE PET SKULL BASE TO THIGH TECHNIQUE: 7.2 mCi F-18 FDG was injected intravenously. Full-ring PET imaging was performed from the skull base to thigh after the radiotracer. CT data was obtained and used for attenuation correction and anatomic localization. Fasting blood glucose: 128 mg/dl COMPARISON:  CT chest 04/12/2020 and 12/19/2019. FINDINGS: Mediastinal blood pool activity: SUV max 2.3 Liver activity: SUV max NA NECK: No abnormal hypermetabolism. Incidental CT findings: None. CHEST: Mild hypermetabolism the thyroid. Hypermetabolic low right paratracheal lymph node measures 1.7 cm with an SUV max of 14.7. Hypermetabolic right hilar lymph node is difficult to measure on CT but has an SUV max of 20.5. Lateral segment right middle lobe mass measures approximately 4.6 x 5.8 cm with an SUV max of 17.0. New small area of consolidation in the medial right lower lobe (4/88) is mildly hypermetabolic and likely infectious or inflammatory given its relative absence on 04/12/2020. Incidental CT findings: Atherosclerotic calcification of the aorta, aortic valve and coronary arteries. Heart is at the  upper limits of normal in size to mildly enlarged. No pericardial effusion. Trace bilateral pleural effusions. Centrilobular and paraseptal emphysema. ABDOMEN/PELVIS: No abnormal hypermetabolism in the liver, adrenal glands, spleen or pancreas. No hypermetabolic lymph nodes. Incidental CT findings: Liver margin is irregular. Cholecystectomy. Adrenal glands, kidneys, spleen, pancreas stomach and bowel are grossly unremarkable. Atherosclerotic calcification of the aorta without aneurysm. SKELETON: No abnormal hypermetabolism. Incidental CT findings: Degenerative changes in the spine. No worrisome lytic or sclerotic lesions. IMPRESSION: 1. 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 2. Trace bilateral pleural effusions. 3. Cirrhosis. 4. Mildly hypermetabolic thyroid.  Consider laboratory correlation. 5. Aortic atherosclerosis (ICD10-I70.0). Coronary artery calcification. 6.  Emphysema (ICD10-J43.9). Electronically Signed   By: Lorin Picket M.D.   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 Plan for vaccination at later date given current lung mass work up. Will continue to monitor, evaluate for hepatoma at next imaging via CT that is planned. Will update labs.   IDA: due for follow up labs. EGD/colonoscopy/capsule study completed. Recommend avoiding asa powders/NSAIDS. Continue PPI.   Chronic diarrhea: occurring for years. Previous random colon biopsies negative. Celiac screen negative. Recent stools negative. Inadequate management on bentyl. Symptoms present since gallbladder removed remotely. Trial of  colestid 3 grams daily not to be taken within two hours of other medications. Would consider CTA A/P due to chronic diarrhea, weight loss, h/o vascular disease. Will premedicate given history of allergic reactions to contrast. She tolerates with predication.   Return to office in four weeks.

## 2020-05-17 NOTE — Telephone Encounter (Signed)
PA approved for CTA A/P via [employer] website. Auth# [id number] dates 06/08/2020-07/08/2020   Called caregiver [name] at [phone] and provided appt details for CTA 8/25, arrival 7:45am, water only 4 hrs prior. Also advised of premedication for CTA. Letter mailed.

## 2020-05-18 ENCOUNTER — Telehealth: Payer: Self-pay | Admitting: Emergency Medicine

## 2020-05-18 NOTE — Telephone Encounter (Signed)
[facility] called and stated pts insurance will not pay for colestid 1 g but they will pay for generic. However, it  is unavailable. Pharmacy wants to know if it can be switched. Pharmacy states the colestid packets are covered [id number]   Please advise

## 2020-05-18 NOTE — Telephone Encounter (Signed)
Please let patient know what is going on. She was opposed to liquid medication. Please find out if she is willing to try.

## 2020-05-18 NOTE — Telephone Encounter (Signed)
Called pt and sh stated she is agreeable to take liquid medication please send into [name]

## 2020-05-18 NOTE — Telephone Encounter (Signed)
Called pt and sh stated she is agreeable to take liquid medication please send into [facility]

## 2020-05-19 ENCOUNTER — Telehealth: Payer: Self-pay | Admitting: Emergency Medicine

## 2020-05-19 ENCOUNTER — Telehealth: Payer: Self-pay | Admitting: Internal Medicine

## 2020-05-19 ENCOUNTER — Encounter (HOSPITAL_COMMUNITY): Payer: Self-pay | Admitting: Emergency Medicine

## 2020-05-19 ENCOUNTER — Other Ambulatory Visit: Payer: Self-pay

## 2020-05-19 LAB — COMPREHENSIVE METABOLIC PANEL
ALT: 14 IU/L (ref 0–32)
AST: 17 IU/L (ref 0–40)
Albumin/Globulin Ratio: 1.3 (ref 1.2–2.2)
Albumin: 4.3 g/dL (ref 3.8–4.8)
Alkaline Phosphatase: 81 IU/L (ref 48–121)
BUN/Creatinine Ratio: 23 (ref 12–28)
BUN: 38 mg/dL — ABNORMAL HIGH (ref 8–27)
Bilirubin Total: 0.2 mg/dL (ref 0.0–1.2)
CO2: 16 mmol/L — ABNORMAL LOW (ref 20–29)
Calcium: 10.3 mg/dL (ref 8.7–10.3)
Chloride: 100 mmol/L (ref 96–106)
Creatinine, Ser: 1.65 mg/dL — ABNORMAL HIGH (ref 0.57–1.00)
GFR calc Af Amer: 37 mL/min/{1.73_m2} — ABNORMAL LOW (ref >59)
GFR calc non Af Amer: 32 mL/min/{1.73_m2} — ABNORMAL LOW (ref >59)
Globulin, Total: 3.4 g/dL (ref 1.5–4.5)
Glucose: 103 mg/dL — ABNORMAL HIGH (ref 65–99)
Potassium: 4.6 mmol/L (ref 3.5–5.2)
Sodium: 135 mmol/L (ref 134–144)
Total Protein: 7.7 g/dL (ref 6.0–8.5)

## 2020-05-19 LAB — CBC WITH DIFFERENTIAL/PLATELET
Basophils Absolute: 0 10*3/uL (ref 0.0–0.2)
Basos: 0 %
EOS (ABSOLUTE): 0.2 10*3/uL (ref 0.0–0.4)
Eos: 3 %
Hematocrit: 32.7 % — ABNORMAL LOW (ref 34.0–46.6)
Hemoglobin: 10.5 g/dL — ABNORMAL LOW (ref 11.1–15.9)
Immature Grans (Abs): 0 10*3/uL (ref 0.0–0.1)
Immature Granulocytes: 0 %
Lymphocytes Absolute: 1.7 10*3/uL (ref 0.7–3.1)
Lymphs: 19 %
MCH: 29.4 pg (ref 26.6–33.0)
MCHC: 32.1 g/dL (ref 31.5–35.7)
MCV: 92 fL (ref 79–97)
Monocytes Absolute: 0.6 10*3/uL (ref 0.1–0.9)
Monocytes: 6 %
Neutrophils Absolute: 6.5 10*3/uL (ref 1.4–7.0)
Neutrophils: 72 %
Platelets: 280 10*3/uL (ref 150–450)
RBC: 3.57 x10E6/uL — ABNORMAL LOW (ref 3.77–5.28)
RDW: 12.4 % (ref 11.7–15.4)
WBC: 9.1 10*3/uL (ref 3.4–10.8)

## 2020-05-19 LAB — PROTIME-INR
INR: 1.1 (ref 0.9–1.2)
Prothrombin Time: 11.5 s (ref 9.1–12.0)

## 2020-05-19 LAB — IRON AND TIBC
Iron Saturation: 10 % — ABNORMAL LOW (ref 15–55)
Iron: 29 ug/dL (ref 27–139)
Total Iron Binding Capacity: 288 ug/dL (ref 250–450)
UIBC: 259 ug/dL (ref 118–369)

## 2020-05-19 MED ORDER — COLESTIPOL HCL 5 G PO PACK: 3.0000 g | PACK | Freq: Every day | ORAL | 0 refills | Status: DC

## 2020-05-19 NOTE — Progress Notes (Addendum)
I called Ms [name]- she gave the phone to her " [place]ona"- Melven Sartorius.   Ms Verne Spurr reports that Ms. [name] has legally  changed her last name to [place], patient's maiden name. Ms Reola Mosher reports that Ms Espiritu  has short term memory issuses and need help with current happenings, Ms Reola Mosher will be with patient in am.  [name] reported that patient has not had shortness of breath or chest pain. Ms Dech tested negative for Covid on 05/17/20.  Patient has been in her home, she has been in contact with some of her small social group in her home with no one wearing a mask. [name] reported that everyone have been vaccinated including the patient.  Ms. [name] has type II diabetes, she does not have a CBG machine.  I gave instructions that if patient feels that glucose level is stable that she may take 1/2 of scheduled Lantus Insulin. I asked to hold Metformin in am.  I instructed [name] to ask patient to not take any more vitamins, Excedrin Migraine or Ibuprofen today or in am.  I spoke with Dr. Jefm Petty, he said patient can be retested on arrival.  I notified Dr Lamonte Sakai of retest and patient's name change, through staff message,

## 2020-05-19 NOTE — Addendum Note (Signed)
Addended by: Mahala Menghini on: 05/19/2020 07:28 AM   Modules accepted: Orders

## 2020-05-19 NOTE — Telephone Encounter (Signed)
Jonni Sanger from [facility] called and wanted to let the nurse Magda Paganini and Neil Crouch, PA know that he found the prescription in tablet form and was sending it to [facility] because they have it in [place] for the patient prior to her procedure tomorrow and her case worker is aware also.

## 2020-05-19 NOTE — Telephone Encounter (Signed)
Epic was being weird with this one. I tried to prescribe (2.5grams=1/2packet) but it would not let me.   I want her to get 1/2 packet (2.5grams) daily (not within two hours of other medications). Dispense 30 packets with 1 refill

## 2020-05-19 NOTE — Telephone Encounter (Signed)
[facility] called and stated they did receive the rx for the colestied. However, it was sent in as 3g by mouth daily. The packets come in 5g only. Pharmacy wants to know if this is ok. Pharmacy also stated he can only dispense it in 30 day packets he is unable to break it up. Is this ok? Please advise.

## 2020-05-19 NOTE — Anesthesia Preprocedure Evaluation (Addendum)
Anesthesia Evaluation  Patient identified by MRN, date of birth, ID band Patient awake    Reviewed: Allergy & Precautions, NPO status , Patient's Chart, lab work & pertinent test results  History of Anesthesia Complications Negative for: history of anesthetic complications  Airway Mallampati: III  TM Distance: >3 FB Neck ROM: Full    Dental  (+) Edentulous Upper, Edentulous Lower   Pulmonary COPD, former smoker,  Lung mass   Pulmonary exam normal        Cardiovascular hypertension, (-) angina+ CAD (untreated RCA disease), + Past MI, + Cardiac Stents and + Peripheral Vascular Disease  Normal cardiovascular exam     Neuro/Psych Anxiety Depression negative neurological ROS     GI/Hepatic GERD  ,(+) Cirrhosis       ,   Endo/Other  diabetes, Type 2, Insulin DependentHypothyroidism   Renal/GU Renal InsufficiencyRenal disease  negative genitourinary   Musculoskeletal negative musculoskeletal ROS (+)   Abdominal   Peds  Hematology  (+) anemia ,   Anesthesia Other Findings  Echo 12/21/19: EF 55-60%, gr2dd, normal RV function, mild-mod MR, mild AS (MG 9, AVA 2.1)  Cath 12/24/19:  LM: Normal LAD: Minimal luminal irregularities LCx: Prox LCx 30%, prox 50% OM1 stenosis      RCA: Prox RCA CTO with right-to right bridging collaterals, and left-to-right collaterals  Reproductive/Obstetrics                           Anesthesia Physical Anesthesia Plan  ASA: III  Anesthesia Plan: General   Post-op Pain Management:    Induction: Intravenous  PONV Risk Score and Plan: 3 and Ondansetron, Dexamethasone, Treatment may vary due to age or medical condition and Midazolam  Airway Management Planned: Oral ETT  Additional Equipment: None  Intra-op Plan:   Post-operative Plan: Extubation in OR  Informed Consent: I have reviewed the patients History and Physical, chart, labs and discussed the  procedure including the risks, benefits and alternatives for the proposed anesthesia with the patient or authorized representative who has indicated his/her understanding and acceptance.     Consent reviewed with POA  Plan Discussed with:   Anesthesia Plan Comments: (PAT note by Karoline Caldwell, PA-C: Patient was hospitalized in March 2021 with complaints of chest pain and shortness of breath. ACS ruled out, high sensitivity troponin normal. LHC showed CTO of RCA. Right middle lobe mass was seen again on chest x-ray in spite of completing treatment for pneumonia. Patient was recommended outpatient follow-up pulmonology with possibility of performing a biopsy.Followup CT with findings concerning for malignancy.  Follows with cardiology for hx of CAD with prior bare-metal stent to left circumflex post MI 1997, DES secondary toM1branch in 2000,LHC on 12/24/2018 with total occlusion of proximal RCA. Last seen by Dr. Virgina Jock 01/28/20. Per note, "No angina symptoms at this time. Given lack of symptoms, concern for ongoing melena, and undifferentiated lung mass, I do not recommend complex PCI to her RCA CTO." Her Plavix was stopped.  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 She is also being followed for iron deficiency anemia.  IDDMII, last A1c 8.2 on 12/20/19.  Labs from GI visit 05/18/20 reviewed, creatinine 1.65 which appears near recent baseline. Mild anemia with Hgb 10.5.  Will need DOS eval.  PFT 05/12/20: FVC-%Pred-Pre Latest Units: % 86 FEV1-%Pred-Pre Latest Units: % 87 FEV1FVC-%Pred-Pre Latest Units: % 100  DLCO unc % pred Latest Units: % 58  CT Chest 04/12/20: IMPRESSION: 1. Interval increase in size of mass like consolidation within the right middle lobe. Findings are worrisome for malignancy. Further evaluation with PET-CT is advised. 2. Persistent, asymmetric enlargement of the  right hilum concerning for adenopathy. 3. Similar appearance of diffusely increased interstitial markings throughout both lungs with peripheral predominant interstitial reticulation. Findings are nonspecific and may reflect underlying interstitial lung disease. 4. Three vessel coronary artery calcifications noted. 5. Morphologic features of the liver concerning for cirrhosis. 6. Emphysema and aortic atherosclerosis.   Coronary angiography 12/24/2019: LM: Normal LAD: Minimal luminal irregularities LCx: Prox LCx 30%, prox 50% OM1 stenosis  RCA: Prox RCA CTO with right-to right bridging collaterals, and left-to-right collaterals  LVEDP 28 mmHg. Hb 7.9 g/dl.   Echocardiogram 12/21/2019: 1. Left ventricular ejection fraction, by estimation, is 55 to 60%. The  left ventricle has normal function. The left ventricle has no regional  wall motion abnormalities. Left ventricular diastolic parameters are  consistent with Grade II diastolic  dysfunction (pseudonormalization).  2. Right ventricular systolic function is normal. The right ventricular  size is normal.  3. Left atrial size was mildly dilated.  4. The mitral valve is grossly normal. Mild to moderate mitral valve  regurgitation.  5. Trileaflet aortic valve with mild calcification. Mild aortic stenosis.  Mean PG 9 mmHg, Vmax 2.0 m/sec, AVA 2.1 cm2.  6. Estimated RA pressure 8 mmHg.    )       Anesthesia Quick Evaluation

## 2020-05-19 NOTE — Progress Notes (Signed)
Anesthesia Chart Review: Same day workup  Patient was hospitalized in March 2021 with complaints of chest pain and shortness of breath.  ACS ruled out, high sensitivity troponin normal. LHC showed CTO of RCA.  Right middle lobe mass was seen again on chest x-ray in spite of completing treatment for pneumonia.  Patient was recommended outpatient follow-up pulmonology with possibility of performing a biopsy. Followup CT with findings concerning for malignancy.  Follows with cardiology for hx of CAD with prior bare-metal stent to left circumflex post MI 1997, DES secondary toM1branch in 2000,LHC on 12/24/2018 with total occlusion of proximal RCA. Last seen by Dr. Virgina Jock 01/28/20. Per note, "No angina symptoms at this time. Given lack of symptoms, concern for ongoing melena, and undifferentiated lung mass, I do not recommend complex PCI to her RCA CTO." Her Plavix was stopped.  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 She is also being followed for iron deficiency anemia.  IDDMII, last A1c 8.2 on 12/20/19.  Labs from GI visit 05/18/20 reviewed, creatinine 1.65 which appears near recent baseline. Mild anemia with Hgb 10.5.  Will need DOS eval.  PFT 05/12/20: FVC-%Pred-Pre Latest Units: % 86  FEV1-%Pred-Pre Latest Units: % 87  FEV1FVC-%Pred-Pre Latest Units: % 100  DLCO unc % pred Latest Units: % 58   CT Chest 04/12/20: IMPRESSION: 1. Interval increase in size of mass like consolidation within the right middle lobe. Findings are worrisome for malignancy. Further evaluation with PET-CT is advised. 2. Persistent, asymmetric enlargement of the right hilum concerning for adenopathy. 3. Similar appearance of diffusely increased interstitial markings throughout both lungs with peripheral predominant interstitial reticulation. Findings are nonspecific and may reflect underlying interstitial lung  disease. 4. Three vessel coronary artery calcifications noted. 5. Morphologic features of the liver concerning for cirrhosis. 6. Emphysema and aortic atherosclerosis.   Coronary angiography 12/24/2019: LM: Normal LAD: Minimal luminal irregularities LCx: Prox LCx 30%, prox 50% OM1 stenosis  RCA: Prox RCA CTO with right-to right bridging collaterals, and left-to-right collaterals  LVEDP 28 mmHg. Hb 7.9 g/dl.   Echocardiogram 12/21/2019: 1. Left ventricular ejection fraction, by estimation, is 55 to 60%. The  left ventricle has normal function. The left ventricle has no regional  wall motion abnormalities. Left ventricular diastolic parameters are  consistent with Grade II diastolic  dysfunction (pseudonormalization).  2. Right ventricular systolic function is normal. The right ventricular  size is normal.  3. Left atrial size was mildly dilated.  4. The mitral valve is grossly normal. Mild to moderate mitral valve  regurgitation.  5. Trileaflet aortic valve with mild calcification. Mild aortic stenosis.  Mean PG 9 mmHg, Vmax 2.0 m/sec, AVA 2.1 cm2.  6. Estimated RA pressure 8 mmHg.   Wynonia Musty [facility] Stay [facility]/Anesthesiology Phone [phone] 05/19/2020 3:06 PM

## 2020-05-19 NOTE — Telephone Encounter (Signed)
Done

## 2020-05-20 ENCOUNTER — Ambulatory Visit (HOSPITAL_COMMUNITY): Payer: Medicare HMO | Admitting: Physician Assistant

## 2020-05-20 ENCOUNTER — Telehealth: Payer: Self-pay | Admitting: Emergency Medicine

## 2020-05-20 ENCOUNTER — Encounter (HOSPITAL_COMMUNITY): Payer: Self-pay | Admitting: Emergency Medicine

## 2020-05-20 ENCOUNTER — Other Ambulatory Visit (HOSPITAL_COMMUNITY)
Admission: RE | Admit: 2020-05-20 | Discharge: 2020-05-20 | Disposition: A | Discharge status: Home / Self Care | Payer: Medicare HMO | Source: Ambulatory Visit | Attending: Emergency Medicine | Admitting: Emergency Medicine

## 2020-05-20 ENCOUNTER — Encounter (HOSPITAL_COMMUNITY)
Admission: RE | Disposition: A | Discharge status: Home / Self Care | Payer: Self-pay | Source: Home / Self Care | Attending: Emergency Medicine

## 2020-05-20 ENCOUNTER — Other Ambulatory Visit: Payer: Self-pay

## 2020-05-20 ENCOUNTER — Ambulatory Visit (HOSPITAL_COMMUNITY)
Admission: RE | Admit: 2020-05-20 | Discharge: 2020-05-20 | Disposition: A | Discharge status: Home / Self Care | Payer: Medicare HMO | Attending: Emergency Medicine | Admitting: Emergency Medicine

## 2020-05-20 DIAGNOSIS — E039 Hypothyroidism, unspecified: Secondary | ICD-10-CM | POA: Diagnosis not present

## 2020-05-20 DIAGNOSIS — I110 Hypertensive heart disease with heart failure: Secondary | ICD-10-CM | POA: Insufficient documentation

## 2020-05-20 DIAGNOSIS — I252 Old myocardial infarction: Secondary | ICD-10-CM | POA: Insufficient documentation

## 2020-05-20 DIAGNOSIS — Z86718 Personal history of other venous thrombosis and embolism: Secondary | ICD-10-CM | POA: Diagnosis not present

## 2020-05-20 DIAGNOSIS — E785 Hyperlipidemia, unspecified: Secondary | ICD-10-CM | POA: Diagnosis not present

## 2020-05-20 DIAGNOSIS — E1151 Type 2 diabetes mellitus with diabetic peripheral angiopathy without gangrene: Secondary | ICD-10-CM | POA: Insufficient documentation

## 2020-05-20 DIAGNOSIS — Z87891 Personal history of nicotine dependence: Secondary | ICD-10-CM | POA: Diagnosis not present

## 2020-05-20 DIAGNOSIS — F329 Major depressive disorder, single episode, unspecified: Secondary | ICD-10-CM | POA: Insufficient documentation

## 2020-05-20 DIAGNOSIS — K219 Gastro-esophageal reflux disease without esophagitis: Secondary | ICD-10-CM | POA: Diagnosis not present

## 2020-05-20 DIAGNOSIS — Z794 Long term (current) use of insulin: Secondary | ICD-10-CM | POA: Insufficient documentation

## 2020-05-20 DIAGNOSIS — I2510 Atherosclerotic heart disease of native coronary artery without angina pectoris: Secondary | ICD-10-CM | POA: Diagnosis not present

## 2020-05-20 DIAGNOSIS — Z881 Allergy status to other antibiotic agents status: Secondary | ICD-10-CM | POA: Diagnosis not present

## 2020-05-20 DIAGNOSIS — Z20822 Contact with and (suspected) exposure to covid-19: Secondary | ICD-10-CM | POA: Diagnosis not present

## 2020-05-20 DIAGNOSIS — K7460 Unspecified cirrhosis of liver: Secondary | ICD-10-CM | POA: Diagnosis not present

## 2020-05-20 DIAGNOSIS — C969 Malignant neoplasm of lymphoid, hematopoietic and related tissue, unspecified: Secondary | ICD-10-CM | POA: Diagnosis not present

## 2020-05-20 DIAGNOSIS — R590 Localized enlarged lymph nodes: Secondary | ICD-10-CM | POA: Diagnosis present

## 2020-05-20 DIAGNOSIS — Z7982 Long term (current) use of aspirin: Secondary | ICD-10-CM | POA: Diagnosis not present

## 2020-05-20 DIAGNOSIS — Z888 Allergy status to other drugs, medicaments and biological substances status: Secondary | ICD-10-CM | POA: Diagnosis not present

## 2020-05-20 DIAGNOSIS — J449 Chronic obstructive pulmonary disease, unspecified: Secondary | ICD-10-CM | POA: Insufficient documentation

## 2020-05-20 DIAGNOSIS — I5032 Chronic diastolic (congestive) heart failure: Secondary | ICD-10-CM | POA: Insufficient documentation

## 2020-05-20 DIAGNOSIS — Z880 Allergy status to penicillin: Secondary | ICD-10-CM | POA: Diagnosis not present

## 2020-05-20 DIAGNOSIS — R918 Other nonspecific abnormal finding of lung field: Secondary | ICD-10-CM | POA: Diagnosis not present

## 2020-05-20 DIAGNOSIS — Z91040 Latex allergy status: Secondary | ICD-10-CM | POA: Insufficient documentation

## 2020-05-20 DIAGNOSIS — C3490 Malignant neoplasm of unspecified part of unspecified bronchus or lung: Secondary | ICD-10-CM

## 2020-05-20 DIAGNOSIS — Z79899 Other long term (current) drug therapy: Secondary | ICD-10-CM | POA: Insufficient documentation

## 2020-05-20 DIAGNOSIS — E1142 Type 2 diabetes mellitus with diabetic polyneuropathy: Secondary | ICD-10-CM | POA: Insufficient documentation

## 2020-05-20 HISTORY — DX: Anemia, unspecified: D64.9

## 2020-05-20 HISTORY — DX: Headache, unspecified: R51.9

## 2020-05-20 HISTORY — DX: Panic disorder (episodic paroxysmal anxiety): F41.0

## 2020-05-20 HISTORY — DX: Hypotension, unspecified: I95.9

## 2020-05-20 HISTORY — PX: BRONCHIAL NEEDLE ASPIRATION BIOPSY: SHX5106

## 2020-05-20 HISTORY — DX: Diarrhea, unspecified: R19.7

## 2020-05-20 HISTORY — DX: Anxiety disorder, unspecified: F41.9

## 2020-05-20 HISTORY — DX: Peripheral vascular disease, unspecified: I73.9

## 2020-05-20 HISTORY — DX: Personal history of urinary calculi: Z87.442

## 2020-05-20 HISTORY — PX: VIDEO BRONCHOSCOPY WITH ENDOBRONCHIAL ULTRASOUND: SHX6177

## 2020-05-20 LAB — CBC
HCT: 31.1 % — ABNORMAL LOW (ref 36.0–46.0)
Hemoglobin: 9.8 g/dL — ABNORMAL LOW (ref 12.0–15.0)
MCH: 30.2 pg (ref 26.0–34.0)
MCHC: 31.5 g/dL (ref 30.0–36.0)
MCV: 95.7 fL (ref 80.0–100.0)
Platelets: 243 10*3/uL (ref 150–400)
RBC: 3.25 MIL/uL — ABNORMAL LOW (ref 3.87–5.11)
RDW: 13.3 % (ref 11.5–15.5)
WBC: 8.1 10*3/uL (ref 4.0–10.5)
nRBC: 0 % (ref 0.0–0.2)

## 2020-05-20 LAB — SARS CORONAVIRUS 2 BY RT PCR (HOSPITAL ORDER, PERFORMED IN ~~LOC~~ HOSPITAL LAB): SARS Coronavirus 2: NEGATIVE

## 2020-05-20 LAB — COMPREHENSIVE METABOLIC PANEL
ALT: 15 U/L (ref 0–44)
AST: 12 U/L — ABNORMAL LOW (ref 15–41)
Albumin: 3.2 g/dL — ABNORMAL LOW (ref 3.5–5.0)
Alkaline Phosphatase: 52 U/L (ref 38–126)
Anion gap: 10 (ref 5–15)
BUN: 28 mg/dL — ABNORMAL HIGH (ref 8–23)
CO2: 18 mmol/L — ABNORMAL LOW (ref 22–32)
Calcium: 9.4 mg/dL (ref 8.9–10.3)
Chloride: 109 mmol/L (ref 98–111)
Creatinine, Ser: 1.36 mg/dL — ABNORMAL HIGH (ref 0.44–1.00)
GFR calc Af Amer: 46 mL/min — ABNORMAL LOW (ref >60)
GFR calc non Af Amer: 40 mL/min — ABNORMAL LOW (ref >60)
Glucose, Bld: 97 mg/dL (ref 70–99)
Potassium: 4.8 mmol/L (ref 3.5–5.1)
Sodium: 137 mmol/L (ref 135–145)
Total Bilirubin: 0.4 mg/dL (ref 0.3–1.2)
Total Protein: 7.1 g/dL (ref 6.5–8.1)

## 2020-05-20 LAB — APTT: aPTT: 35 seconds (ref 24–36)

## 2020-05-20 LAB — PROTIME-INR
INR: 1.2 (ref 0.8–1.2)
Prothrombin Time: 14.2 seconds (ref 11.4–15.2)

## 2020-05-20 LAB — GLUCOSE, CAPILLARY
Glucose-Capillary: 89 mg/dL (ref 70–99)
Glucose-Capillary: 110 mg/dL — ABNORMAL HIGH (ref 70–99)

## 2020-05-20 SURGERY — BRONCHOSCOPY, WITH NEEDLE ASPIRATION BIOPSY
Anesthesia: General

## 2020-05-20 MED ORDER — OXYCODONE HCL 5 MG PO TABS: 5.0000 mg | ORAL_TABLET | Freq: Once | ORAL | Status: DC | PRN

## 2020-05-20 MED ORDER — PHENYLEPHRINE HCL (PRESSORS) 10 MG/ML IV SOLN
INTRAVENOUS | Status: DC | PRN
Start: 2020-05-20 — End: 2020-05-20
  Administered 2020-05-20: 80 ug via INTRAVENOUS
  Administered 2020-05-20: 40 ug via INTRAVENOUS
  Administered 2020-05-20: 120 ug via INTRAVENOUS

## 2020-05-20 MED ORDER — FENTANYL CITRATE (PF) 100 MCG/2ML IJ SOLN
INTRAMUSCULAR | Status: DC | PRN
  Administered 2020-05-20 (×2): 50 ug via INTRAVENOUS

## 2020-05-20 MED ORDER — FENTANYL CITRATE (PF) 100 MCG/2ML IJ SOLN
INTRAMUSCULAR | Status: AC
  Filled 2020-05-20: qty 4

## 2020-05-20 MED ORDER — LIDOCAINE 2% (20 MG/ML) 5 ML SYRINGE
INTRAMUSCULAR | Status: DC | PRN
  Administered 2020-05-20: 100 mg via INTRAVENOUS

## 2020-05-20 MED ORDER — FENTANYL CITRATE (PF) 100 MCG/2ML IJ SOLN: 25.0000 ug | INTRAMUSCULAR | Status: DC | PRN

## 2020-05-20 MED ORDER — OXYCODONE-ACETAMINOPHEN 5-325 MG PO TABS: 0.5000 | ORAL_TABLET | Freq: Two times a day (BID) | ORAL | Status: DC | PRN

## 2020-05-20 MED ORDER — AMISULPRIDE (ANTIEMETIC) 5 MG/2ML IV SOLN: 10.0000 mg | Freq: Once | INTRAVENOUS | Status: DC | PRN

## 2020-05-20 MED ORDER — OXYCODONE HCL 5 MG/5ML PO SOLN: 5.0000 mg | Freq: Once | ORAL | Status: DC | PRN

## 2020-05-20 MED ORDER — DEXAMETHASONE SODIUM PHOSPHATE 10 MG/ML IJ SOLN
INTRAMUSCULAR | Status: DC | PRN
  Administered 2020-05-20: 10 mg via INTRAVENOUS

## 2020-05-20 MED ORDER — ONDANSETRON HCL 4 MG/2ML IJ SOLN
INTRAMUSCULAR | Status: DC | PRN
  Administered 2020-05-20: 4 mg via INTRAVENOUS

## 2020-05-20 MED ORDER — ONDANSETRON HCL 4 MG/2ML IJ SOLN: 4.0000 mg | Freq: Once | INTRAMUSCULAR | Status: DC | PRN

## 2020-05-20 MED ORDER — PROPOFOL 10 MG/ML IV BOLUS
INTRAVENOUS | Status: DC | PRN
  Administered 2020-05-20: 120 mg via INTRAVENOUS

## 2020-05-20 MED ORDER — LACTATED RINGERS IV SOLN: INTRAVENOUS | Status: DC

## 2020-05-20 MED ORDER — CHLORHEXIDINE GLUCONATE 0.12 % MT SOLN
OROMUCOSAL | Status: AC
  Administered 2020-05-20: 15 mL
  Filled 2020-05-20: qty 15

## 2020-05-20 MED ORDER — ROCURONIUM BROMIDE 10 MG/ML (PF) SYRINGE
PREFILLED_SYRINGE | INTRAVENOUS | Status: DC | PRN
  Administered 2020-05-20: 50 mg via INTRAVENOUS

## 2020-05-20 MED ORDER — SUGAMMADEX SODIUM 200 MG/2ML IV SOLN
INTRAVENOUS | Status: DC | PRN
  Administered 2020-05-20: 200 mg via INTRAVENOUS

## 2020-05-20 NOTE — Telephone Encounter (Signed)
Spoke with [facility], they transferred RX to [facility]. They are aware of directions.

## 2020-05-20 NOTE — Op Note (Signed)
Video Bronchoscopy with Endobronchial Ultrasound and Electromagnetic Navigation Procedure Note  Date of Operation: 05/20/2020  Pre-op Diagnosis: Right hilar mass with mediastinal lymphadenopathy  Post-op Diagnosis: Same  Surgeon: Baltazar Apo  Assistants: None  Anesthesia: General endotracheal anesthesia  Operation: Flexible video fiberoptic bronchoscopy with endobronchial ultrasound and biopsies.  Estimated Blood Loss: Minimal  Complications: None apparent  Indications and History: [name] is a 69 y.o. female with history of smoking, COPD, other medical problems who was found to have a right hilar mass on CT chest, mediastinal lymphadenopathy.  Recommendation was made to achieve a tissue diagnosis via navigational bronchoscopy and endobronchial ultrasound.  The risks, benefits, complications, treatment options and expected outcomes were discussed with the patient.  The possibilities of pneumothorax, pneumonia, reaction to medication, pulmonary aspiration, perforation of a viscus, bleeding, failure to diagnose a condition and creating a complication requiring transfusion or operation were discussed with the patient who freely signed the consent.    Description of Procedure: The patient was examined in the preoperative area and history and data from the preprocedure consultation were reviewed. It was deemed appropriate to proceed.  The patient was taken to [facility] endoscopy room 2, identified as [name] and the procedure verified as Flexible Video Fiberoptic Bronchoscopy.  A Time Out was held and the above information confirmed. After being taken to the operating room general anesthesia was initiated and the patient  was orally intubated.   Prior to the date of the procedure a high-resolution CT scan of the chest was performed. Utilizing [employer] a virtual tracheobronchial tree was generated to allow the creation of distinct navigation pathways to the  patient's parenchymal abnormality  The video fiberoptic bronchoscope was introduced via the endotracheal tube and a general inspection was performed which showed normal airways throughout.  There were no endobronchial lesions or abnormal secretions. The standard scope was then withdrawn and the endobronchial ultrasound was used to identify and characterize the peritracheal, hilar and bronchial lymph nodes. Inspection showed enlargement at 4R, 4L, 12 L, 7. Using real-time ultrasound guidance Wang needle biopsies were take from Station 4L, 12 L, 7 nodes and were sent for cytology.   As above navigational planning was performed but initial cytology from station 4L was adequate for interpretation, suspicious for malignancy.  For that reason transbronchial biopsies and brushings of the right middle lobe mass were deferred.  There was no fluoroscopy used.  The patient tolerated the procedure well without apparent complications. There was no significant blood loss. The bronchoscope was withdrawn. Anesthesia was reversed and the patient was taken to the PACU for recovery.   Samples: 1. Wang needle biopsies from 4R node 2. Wang needle biopsies from 12 L node 3. Wang needle biopsies from 4L node   Plans:  The patient will be discharged from the PACU to home when recovered from anesthesia. We will review the cytology, pathology and microbiology results with the patient when they become available. Outpatient followup will be with Dr. [name].    Baltazar Apo, MD, PhD 05/20/2020, 9:11 AM [facility] [phone] or if no answer [phone]

## 2020-05-20 NOTE — Telephone Encounter (Signed)
Call patient's guardian Alexis Frock when bronchoscopy biopsy data available

## 2020-05-20 NOTE — Transfer of Care (Signed)
Immediate Anesthesia Transfer of Care Note  Patient: [name]  Procedure(s) Performed: BRONCHIAL NEEDLE ASPIRATION BIOPSIES (N/A ) VIDEO BRONCHOSCOPY WITH ENDOBRONCHIAL ULTRASOUND (N/A )  Patient Location: PACU  Anesthesia Type:General  Level of Consciousness: awake, alert  and patient cooperative  Airway & Oxygen Therapy: Patient Spontanous Breathing and Patient connected to face mask oxygen  Post-op Assessment: Report given to RN and Post -op Vital signs reviewed and stable  Post vital signs: Reviewed and stable  Last Vitals:  Vitals Value Taken Time  BP 149/106 05/20/20 0916  Temp    Pulse 79 05/20/20 0919  Resp 25 05/20/20 0919  SpO2 94 % 05/20/20 0919  Vitals shown include unvalidated device data.  Last Pain:  Vitals:   05/20/20 0717  PainSc: 0-No pain      Patients Stated Pain Goal: 6 (10/71/25 2479)  Complications: No complications documented.

## 2020-05-20 NOTE — H&P (Signed)
[name] is an 69 y.o. female.   Chief Complaint: Right middle lobe mass HPI: 69 year old woman with a history of coronary disease, hypertension with diastolic CHF, diabetes, hyperlipidemia cirrhosis, prior DVT, COPD, possible early dementia. She was found to have a right middle lobe lung mass on CT chest 12/19/2019, evaluated by Dr. [name] in our office.  Repeat CT June/29/21 showed interval increase in size and then subsequent PET scan on 05/02/2020 confirmed hypermetabolism with associated hyper metallic right hilar and lower right paratracheal lymphadenopathy, trace effusion.  She presents now for further evaluation and for bronchoscopy to attempt to achieve tissue diagnosis.  Her friend and advocate is here with her today, notes that the patient has impaired short-term memory.  On my questioning she does seem to understand the rationale for bronchoscopy the basic risks and benefits.  These discussed in detail with her HCPOA.   Past Medical History:  Diagnosis Date  . Allergic rhinitis   . 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 Anemia   . Anxiety   . ASCVD (arteriosclerotic cardiovascular disease)    MI in 96 requiring BMS CX; DES to M1 in 2000;normal coronary angiography in 2004  . Cholelithiasis   . COPD (chronic obstructive pulmonary disease) ([place])   . DDD (degenerative disc disease), lumbar   . Depression   . Diabetes mellitus    Type II  . Diarrhea   . DVT (deep venous thrombosis) ([place])    patient said no  . GERD (gastroesophageal reflux disease)   . Headache    migraine  . History of kidney stones    2006  . Hyperlipidemia   . Hypotension   . Hypothyroidism   . Low back pain   . Myocardial infarction ([place]) 01/1995  . Nephrolithiasis 2006   stone extraction   . Panic attacks   . Peripheral neuropathy   . Peripheral vascular disease ([facility])    legs  . Pneumonia 12/22/2019  . Sciatic pain    right  . Tobacco abuse   . Tremor     Past Surgical History:   Procedure Laterality Date  . AGILE CAPSULE N/A 03/07/2020   Procedure: AGILE CAPSULE;  Surgeon: Daneil Dolin, MD;  [facility];  Service: Endoscopy;  Laterality: N/A;  7:30am  . BACK SURGERY     fusion  . CHOLECYSTECTOMY    . COLONOSCOPY  01/2009   PQD:IYMEBR rectum/repeat in 5 yrs  . COLONOSCOPY N/A 04/29/2014   Dr.Rourk- attempted/incomplete colonoscopy. inadequate prep  . COLONOSCOPY N/A 05/27/2014   AXE:NMMHWKGSU coli. Colonic polyps-removed as described above.Status post segmental biopsy. single tubular adenoma and random colon bx neg  . COLONOSCOPY WITH PROPOFOL N/A 12/23/2019   Procedure: COLONOSCOPY WITH PROPOFOL;  Surgeon: Ronnette Juniper, MD;  Location: [place];  Service: Gastroenterology;  Laterality: N/A;  . DILATION AND CURETTAGE OF UTERUS  1974  . ESOPHAGOGASTRODUODENOSCOPY  05/2010   Dr. Tessie Fass, erosion. 43F dilation  . ESOPHAGOGASTRODUODENOSCOPY N/A 04/29/2014   Dr.Rourk- normal esophagus s/p passage of maloney dilator. small hiatal hernia- bx= chronic inflammation.  . ESOPHAGOGASTRODUODENOSCOPY (EGD) WITH PROPOFOL N/A 12/23/2019   Procedure: ESOPHAGOGASTRODUODENOSCOPY (EGD) WITH PROPOFOL;  Surgeon: Ronnette Juniper, MD;  Location: [place];  Service: Gastroenterology;  Laterality: N/A;  . GIVENS CAPSULE STUDY N/A 03/22/2020   Procedure: GIVENS CAPSULE STUDY;  Surgeon: Daneil Dolin, MD;  [facility];  Service: Endoscopy;  Laterality: N/A;  7:30am  . HEMOSTASIS CLIP PLACEMENT  12/23/2019  Procedure: HEMOSTASIS CLIP PLACEMENT;  Surgeon: Ronnette Juniper, MD;  Location: [place];  Service: Gastroenterology;;  . HOT HEMOSTASIS N/A 12/23/2019   Procedure: HOT HEMOSTASIS (ARGON PLASMA COAGULATION/BICAP);  Surgeon: Ronnette Juniper, MD;  Location: [place];  Service: Gastroenterology;  Laterality: N/A;  . LEFT HEART CATH AND CORONARY ANGIOGRAPHY N/A 12/24/2019   Procedure: LEFT HEART CATH AND CORONARY ANGIOGRAPHY;  Surgeon: Nigel Mormon, MD;  Location:  [place] [facility];  Service: Cardiovascular;  Laterality: N/A;  . Venia Minks DILATION N/A 04/29/2014   Procedure: Venia Minks DILATION;  Surgeon: Daneil Dolin, MD;  [facility];  Service: Endoscopy;  Laterality: N/A;  . PARTIAL HYSTERECTOMY  1978  . POLYPECTOMY  12/23/2019   Procedure: POLYPECTOMY;  Surgeon: Ronnette Juniper, MD;  Location: [facility];  Service: Gastroenterology;;  . SHOULDER SURGERY Left    Left shoulder for RTC;left arm surgery '98/left hand surgery 2001  . TOTAL ABDOMINAL HYSTERECTOMY W/ BILATERAL SALPINGOOPHORECTOMY  2002  . UMBILICAL HERNIA REPAIR  2008    Family History  Problem Relation Age of Onset  . Depression Mother   . Bipolar disorder Mother   . Dementia Mother   . Pulmonary fibrosis Mother   . Alcohol abuse Father   . Aneurysm Father        deceased age 94, brain  . Colon cancer Paternal Grandfather        age greater than 37   Social History:  reports that she quit smoking about 2 years ago. Her smoking use included cigarettes. She has a 20.00 pack-year smoking history. She has never used smokeless tobacco. She reports that she does not drink alcohol and does not use drugs.  Allergies:  Allergies  Allergen Reactions  . Iohexol      Desc: CHEST TIGHTNESS,BRETHING PROBLEMS NEEDS PRE MEDS   . Tape Other (See Comments)    Tears skin  . Ciprofloxacin Rash  . Latex Rash    GLOVES   . Penicillins Swelling    Did it involve swelling of the face/tongue/throat, SOB, or low BP? Yes Did it involve sudden or severe rash/hives, skin peeling, or any reaction on the inside of your mouth or nose? No Did you need to seek medical attention at a hospital or doctor's office? Yes When did it last happen?1996 If all above answers are "NO", may proceed with cephalosporin use.  . Povidone-Iodine Rash    Medications Prior to Admission  Medication Sig Dispense Refill  . albuterol (PROVENTIL HFA;VENTOLIN HFA) 108 (90 BASE) MCG/ACT inhaler Inhale 2 puffs  into the lungs every 4 (four) hours as needed for wheezing or shortness of breath. 1 Inhaler 0  . aspirin-acetaminophen-caffeine (EXCEDRIN MIGRAINE) 250-250-65 MG tablet Take 2 tablets by mouth daily as needed for headache. Up to 4 times a week    . azelastine (OPTIVAR) 0.05 % ophthalmic solution Place 1 drop into both eyes 2 (two) times daily.    Marland Kitchen b complex vitamins tablet Take 1 tablet by mouth daily. With Zinc and vitamin C    . Benzocaine (BOIL-EASE EX) Apply 1 application topically daily as needed (Boil).    . Biotin 5000 MCG TABS Take 10,000 mcg by mouth daily. Keratin    . Cholecalciferol (VITAMIN D3) 50 MCG (2000 UT) TABS Take 4,000 Units by mouth daily.    . colestipol (COLESTID) 5 g packet Take 3 g by mouth daily. Do not take within 2 hours of other medications. 18 packet 0  . Cyanocobalamin 2500 MCG CHEW Chew  2,500 mcg by mouth daily.    . ferrous sulfate 325 (65 FE) MG tablet Take 325 mg by mouth daily with breakfast.    . furosemide (LASIX) 40 MG tablet Take 1 tablet (40 mg total) by mouth 2 (two) times daily. 30 tablet 1  . hydrocortisone cream 1 % Apply 1 application topically daily as needed for itching.    Marland Kitchen ibuprofen (ADVIL) 200 MG tablet Take 200-600 mg by mouth every 6 (six) hours as needed for moderate pain.    Marland Kitchen LANTUS SOLOSTAR 100 UNIT/ML Solostar Pen Inject 30 Units into the skin daily.     Marland Kitchen levothyroxine (SYNTHROID) 125 MCG tablet Take 125 mcg by mouth daily.    Marland Kitchen lisinopril (ZESTRIL) 20 MG tablet Take 20 mg by mouth at bedtime.    Marland Kitchen LORazepam (ATIVAN) 1 MG tablet Take 1 mg by mouth 2 (two) times daily.    . Menthol, Topical Analgesic, (ICY HOT) 7.5 % (Roll) MISC Apply 1 each topically daily as needed (Pain). spray    . metFORMIN (GLUCOPHAGE) 500 MG tablet Take 500 mg by mouth 2 (two) times daily with a meal.     . Multiple Vitamins-Minerals (MULTIVITAMIN WITH MINERALS) tablet Take 1 tablet by mouth daily. Adult 50+    . neomycin-bacitracin-polymyxin (NEOSPORIN) OINT  Apply 1 application topically daily as needed for irritation or wound care.    Marland Kitchen oxyCODONE-acetaminophen (PERCOCET/ROXICET) 5-325 MG tablet Take 1-2 tablets by mouth every 4 (four) hours as needed for severe pain. (Patient taking differently: Take 0.5 tablets by mouth 2 (two) times daily as needed for severe pain. ) 12 tablet 0  . pantoprazole (PROTONIX) 40 MG tablet Take 40 mg by mouth daily.    Marland Kitchen PARoxetine (PAXIL) 20 MG tablet Take 20 mg by mouth daily.     . pravastatin (PRAVACHOL) 40 MG tablet Take 40 mg by mouth daily.    . traZODone (DESYREL) 150 MG tablet Take 150 mg by mouth at bedtime.     . diphenhydrAMINE (BENADRYL) 50 MG capsule Take 32m 1 hour before scheduled CT 1 capsule 0  . predniSONE (DELTASONE) 50 MG tablet Take 50 mg 13 hours, 7 hours, and 1 hour before scheduled CT scan 3 tablet 0  . Rimegepant Sulfate (NURTEC) 75 MG TBDP Take 75 mg by mouth daily as needed (Migarine).      Results for orders placed or performed during the hospital encounter of 05/20/20 (from the past 48 hour(s))  SARS Coronavirus 2 by RT PCR (hospital order[facility]) Nasopharyngeal Nasopharyngeal Swab     Status: None   Collection Time: 05/20/20  5:51 AM   Specimen: Nasopharyngeal Swab  Result Value Ref Range   SARS Coronavirus 2 NEGATIVE NEGATIVE    Comment: (NOTE) SARS-CoV-2 target nucleic acids are NOT DETECTED.  The SARS-CoV-2 RNA is generally detectable in upper and lower respiratory specimens during the acute phase of infection. The lowest concentration of SARS-CoV-2 viral copies this assay can detect is 250 copies / mL. A negative result does not preclude SARS-CoV-2 infection and should not be used as the sole basis for treatment or other patient management decisions.  A negative result may occur with improper specimen collection / handling, submission of specimen other than nasopharyngeal swab, presence of viral mutation(s) within the areas targeted by this  assay, and inadequate number of viral copies (<250 copies / mL). A negative result must be combined with clinical observations, patient history, and epidemiological information.  Fact Sheet  for Patients:   StrictlyIdeas.no  Fact Sheet for Healthcare Providers: BankingDealers.co.za  This test is not yet approved or  cleared by the [place] FDA and has been authorized for detection and/or diagnosis of SARS-CoV-2 by FDA under an Emergency Use Authorization (EUA).  This EUA will remain in effect (meaning this test can be used) for the duration of the COVID-19 declaration under Section 564(b)(1) of the Act, 21 U.S.C. section 360bbb-3(b)(1), unless the authorization is terminated or revoked sooner.  Performed at [facility], [name] [address] [place], [place] [address]   CBC     Status: Abnormal   Collection Time: 05/20/20  7:10 AM  Result Value Ref Range   WBC 8.1 4.0 - 10.5 K/uL   RBC 3.25 (L) 3.87 - 5.11 MIL/uL   Hemoglobin 9.8 (L) 12.0 - 15.0 g/dL   HCT 31.1 (L) 36 - 46 %   MCV 95.7 80.0 - 100.0 fL   MCH 30.2 26.0 - 34.0 pg   MCHC 31.5 30.0 - 36.0 g/dL   RDW 13.3 11.5 - 15.5 %   Platelets 243 150 - 400 K/uL   nRBC 0.0 0.0 - 0.2 %    Comment: Performed at [facility], [place] [address] [place], [place] [address]  Protime-INR     Status: None   Collection Time: 05/20/20  7:10 AM  Result Value Ref Range   Prothrombin Time 14.2 11.4 - 15.2 seconds   INR 1.2 0.8 - 1.2    Comment: (NOTE) INR goal varies based on device and disease states. Performed at [facility], [place] [address] [place], [place] [address]   APTT     Status: None   Collection Time: 05/20/20  7:10 AM  Result Value Ref Range   aPTT 35 24 - 36 seconds    Comment: Performed at [place] [address] [place], [place] [address]   No results found.  Review of Systems   No SOB, some cough She has diarrhea   Blood pressure (!)  100/39, pulse 69, temperature 98.2 F (36.8 C), resp. rate 18, height 5' 7"  (1.702 m), weight 66.2 kg, SpO2 95 %. Physical Exam  Obese, somewhat chronically ill-appearing woman in no distress on room air Some upper airway noise on inspiration and expiration, strong voice, able to phonate Lungs decreased at both bases, distant but no wheezes or crackles Heart distant, regular without a murmur. Abdomen obese, nondistended, nontender with positive bowel sounds No significant edema Awake, alert, interacting, follows commands  Assessment/Plan Enlarging right middle lobe mass with associated right hilar and paratracheal lymphadenopathy -Plan for bronchoscopy under general anesthesia today 8/6.  We will examine the right hilum with endobronchial ultrasound to see if a tissue diagnosis can be made with transbronchial biopsies in this region.  If not then we will proceed to navigation to the right middle lobe mass.  Patient understands the procedure.  Risk and benefits assessed and all elect to proceed.  Collene Gobble, MD 05/20/2020, 7:31 AM

## 2020-05-20 NOTE — Discharge Instructions (Signed)
Flexible Bronchoscopy, Care After This sheet gives you information about how to care for yourself after your test. Your doctor may also give you more specific instructions. If you have problems or questions, contact your doctor. Follow these instructions at home: Eating and drinking  Do not eat or drink anything (not even water) for 2 hours after your test, or until your numbing medicine (local anesthetic) wears off.  When your numbness is gone and your cough and gag reflexes have come back, you may: ? Eat only soft foods. ? Slowly drink liquids.  The day after the test, go back to your normal diet. Driving  Do not drive for 24 hours if you were given a medicine to help you relax (sedative).  Do not drive or use heavy machinery while taking prescription pain medicine. General instructions   Take over-the-counter and prescription medicines only as told by your doctor.  Return to your normal activities as told. Ask what activities are safe for you.  Do not use any products that have nicotine or tobacco in them. This includes cigarettes and e-cigarettes. If you need help quitting, ask your doctor.  Keep all follow-up visits as told by your doctor. This is important. It is very important if you had a tissue sample (biopsy) taken. Get help right away if:  You have shortness of breath that gets worse.  You get light-headed.  You feel like you are going to pass out (faint).  You have chest pain.  You cough up: ? More than a little blood. ? More blood than before. Summary  Do not eat or drink anything (not even water) for 2 hours after your test, or until your numbing medicine wears off.  Do not use cigarettes. Do not use e-cigarettes.  Get help right away if you have chest pain.  Please call our office for any questions or concerns.  [phone].   This information is not intended to replace advice given to you by your health care provider. Make sure you discuss any  questions you have with your health care provider. Document Revised: 09/13/2017 Document Reviewed: 10/19/2016 Elsevier Patient Education  2020 [employer].

## 2020-05-20 NOTE — Progress Notes (Signed)
Dr. Kerin Perna aware of ring on left hand,cannot get off. Stated that was okay.

## 2020-05-20 NOTE — Anesthesia Postprocedure Evaluation (Signed)
Anesthesia Post Note  Patient: ANAISABEL PEDERSON  Procedure(s) Performed: BRONCHIAL NEEDLE ASPIRATION BIOPSIES (N/A ) VIDEO BRONCHOSCOPY WITH ENDOBRONCHIAL ULTRASOUND (N/A )     Patient location during evaluation: PACU Anesthesia Type: General Level of consciousness: awake and alert Pain management: pain level controlled Vital Signs Assessment: post-procedure vital signs reviewed and stable Respiratory status: spontaneous breathing, nonlabored ventilation and respiratory function stable Cardiovascular status: blood pressure returned to baseline and stable Postop Assessment: no apparent nausea or vomiting Anesthetic complications: no   No complications documented.  Last Vitals:  Vitals:   05/20/20 0945 05/20/20 1000  BP: (!) 95/47 (!) 96/52  Pulse: 60 62  Resp: 15 20  Temp:  36.9 C  SpO2: 97% 92%    Last Pain:  Vitals:   05/20/20 0945  PainSc: 0-No pain                 Lidia Collum

## 2020-05-20 NOTE — Telephone Encounter (Signed)
Noted  

## 2020-05-20 NOTE — Progress Notes (Signed)
Spoke with patient's daughter, Mrs. Zaun has already had her COVID test and Full PFT elsewhere. Unfortunately when patient's daughter tried to cancel her mother's appts. she was given the run around. ( She called multiple #s none of which were the right #.) She made a suggestion: That the # to the scheduler who actually makes these appts. Is the # that she should have been given in the first place.  I will call [place] Monday and make her aware. Nothing further needed. I'll call [place] as well, and give her Kellina's #. Nothing further needed.

## 2020-05-23 ENCOUNTER — Ambulatory Visit: Payer: Medicare HMO | Admitting: Cardiology

## 2020-05-23 ENCOUNTER — Telehealth: Payer: Self-pay

## 2020-05-23 ENCOUNTER — Other Ambulatory Visit: Payer: Self-pay

## 2020-05-23 ENCOUNTER — Encounter: Payer: Self-pay | Admitting: Cardiology

## 2020-05-23 VITALS — BP 93/51 | HR 74 | Resp 16 | Ht 67.0 in | Wt 146.0 lb

## 2020-05-23 DIAGNOSIS — I25118 Atherosclerotic heart disease of native coronary artery with other forms of angina pectoris: Secondary | ICD-10-CM

## 2020-05-23 DIAGNOSIS — D649 Anemia, unspecified: Secondary | ICD-10-CM

## 2020-05-23 DIAGNOSIS — R918 Other nonspecific abnormal finding of lung field: Secondary | ICD-10-CM

## 2020-05-23 DIAGNOSIS — K7460 Unspecified cirrhosis of liver: Secondary | ICD-10-CM

## 2020-05-23 DIAGNOSIS — R6889 Other general symptoms and signs: Secondary | ICD-10-CM | POA: Insufficient documentation

## 2020-05-23 LAB — CYTOLOGY - NON PAP

## 2020-05-23 MED ORDER — ROSUVASTATIN CALCIUM 10 MG PO TABS: 10.0000 mg | ORAL_TABLET | Freq: Every day | ORAL | 3 refills | Status: DC

## 2020-05-23 NOTE — Progress Notes (Signed)
Follow up visit  Subjective:   [name], female    DOB: [birth year], 69 y.o.   MRN: [id number]   HPI   Chief Complaint  Patient presents with  . Coronary Artery Disease  . Follow-up    69 y.o. Caucasian female  with CAD (RCA CTO),  controlled hypertension, hyperlipidemia, type 2 diabetes mellitus, h/o GI Bleed,  former smoker, liver cirrhosis, hypothyroidism, COPD/asthma, depression, lung mass.  Last note 12/2019: No angina symptoms at this time. Given lack of symptoms, concern for ongoing melena, and undifferentiated lung mass, I do not recommend complex PCI to her RCA CTO.  Given melena, I have stopped her plavix.  Continue metoprlolol 12.5 mg bid. Switch pravastatin to rosuvastatin 20 mg daily.  I will request Dr. Delphina Cahill to check her lipid panel during her initial visit with him.  Patient is seeing pulmonology re: her right hilar lung mass.  She recently underwent bronchoscopy.  Results awaited. She has appt with Dr. [name] to discuss the results next week. According to the patient, she was told that likelihood of cancer diagnosis is high. She has not had any recent chest pain, shortness of breath symptoms. She feels fatigued at all times. She denies any recent melena. She takes Excedrin migraine and ibuprofen, only occasionally.    Current Outpatient Medications on File Prior to Visit  Medication Sig Dispense Refill  . albuterol (PROVENTIL HFA;VENTOLIN HFA) 108 (90 BASE) MCG/ACT inhaler Inhale 2 puffs into the lungs every 4 (four) hours as needed for wheezing or shortness of breath. 1 Inhaler 0  . aspirin-acetaminophen-caffeine (EXCEDRIN MIGRAINE) 250-250-65 MG tablet Take 2 tablets by mouth daily as needed for headache. Up to 4 times a week    . azelastine (OPTIVAR) 0.05 % ophthalmic solution Place 1 drop into both eyes 2 (two) times daily.    Marland Kitchen b complex vitamins tablet Take 1 tablet by mouth daily. With Zinc and vitamin C    . Benzocaine (BOIL-EASE EX) Apply 1  application topically daily as needed (Boil).    . Biotin 5000 MCG TABS Take 10,000 mcg by mouth daily. Keratin    . Cholecalciferol (VITAMIN D3) 50 MCG (2000 UT) TABS Take 4,000 Units by mouth daily.    . colestipol (COLESTID) 5 g packet Take 3 g by mouth daily. Do not take within 2 hours of other medications. 18 packet 0  . Cyanocobalamin 2500 MCG CHEW Chew 2,500 mcg by mouth daily.    . ferrous sulfate 325 (65 FE) MG tablet Take 325 mg by mouth daily with breakfast.    . furosemide (LASIX) 40 MG tablet Take 1 tablet (40 mg total) by mouth 2 (two) times daily. (Patient taking differently: Take 40 mg by mouth daily. ) 30 tablet 1  . Ginkgo Biloba Extract (GNP GINGKO BILOBA EXTRACT) 60 MG CAPS Take 1 capsule by mouth daily.    . hydrocortisone cream 1 % Apply 1 application topically daily as needed for itching.    Marland Kitchen ibuprofen (ADVIL) 200 MG tablet Take 200-600 mg by mouth every 6 (six) hours as needed for moderate pain.    Marland Kitchen LANTUS SOLOSTAR 100 UNIT/ML Solostar Pen Inject 30 Units into the skin daily.     Marland Kitchen levothyroxine (SYNTHROID) 125 MCG tablet Take 125 mcg by mouth daily.    Marland Kitchen lisinopril (ZESTRIL) 20 MG tablet Take 20 mg by mouth at bedtime.    Marland Kitchen LORazepam (ATIVAN) 1 MG tablet Take 1 mg by mouth 2 (two) times daily.    Marland Kitchen  metFORMIN (GLUCOPHAGE) 500 MG tablet Take 500 mg by mouth 2 (two) times daily with a meal.     . Multiple Vitamins-Minerals (MULTIVITAMIN WITH MINERALS) tablet Take 1 tablet by mouth daily. Adult 50+    . neomycin-bacitracin-polymyxin (NEOSPORIN) OINT Apply 1 application topically daily as needed for irritation or wound care.    . Omega 3-6-9 Fatty Acids (OMEGA 3-6-9 COMPLEX PO) Take 2 tablets by mouth daily.    Marland Kitchen oxyCODONE-acetaminophen (PERCOCET/ROXICET) 5-325 MG tablet Take 0.5 tablets by mouth 2 (two) times daily as needed for severe pain.    . pantoprazole (PROTONIX) 40 MG tablet Take 40 mg by mouth daily.    Marland Kitchen PARoxetine (PAXIL) 20 MG tablet Take 20 mg by mouth daily.      . pravastatin (PRAVACHOL) 40 MG tablet Take 40 mg by mouth daily.    . traZODone (DESYREL) 150 MG tablet Take 150 mg by mouth at bedtime.     . diphenhydrAMINE (BENADRYL) 50 MG capsule Take 72m 1 hour before scheduled CT (Patient not taking: Reported on 05/23/2020) 1 capsule 0  . Menthol, Topical Analgesic, (ICY HOT) 7.5 % (Roll) MISC Apply 1 each topically daily as needed (Pain). spray (Patient not taking: Reported on 05/23/2020)    . predniSONE (DELTASONE) 50 MG tablet Take 50 mg 13 hours, 7 hours, and 1 hour before scheduled CT scan (Patient not taking: Reported on 05/23/2020) 3 tablet 0   No current facility-administered medications on file prior to visit.    Cardiovascular & other pertient studies:  Coronary angiography 12/24/2019: LM: Normal LAD: Minimal luminal irregularities LCx: Prox LCx 30%, prox 50% OM1 stenosis      RCA: Prox RCA CTO with right-to right bridging collaterals, and left-to-right collaterals  LVEDP 28 mmHg. Hb 7.9 g/dl.   Echocardiogram 12/21/2019: 1. Left ventricular ejection fraction, by estimation, is 55 to 60%. The  left ventricle has normal function. The left ventricle has no regional  wall motion abnormalities. Left ventricular diastolic parameters are  consistent with Grade II diastolic  dysfunction (pseudonormalization).  2. Right ventricular systolic function is normal. The right ventricular  size is normal.  3. Left atrial size was mildly dilated.  4. The mitral valve is grossly normal. Mild to moderate mitral valve  regurgitation.  5. Trileaflet aortic valve with mild calcification. Mild aortic stenosis.  Mean PG 9 mmHg, Vmax 2.0 m/sec, AVA 2.1 cm2.  6. Estimated RA pressure 8 mmHg.   Recent labs: 05/20/2020: Glucose 97, BUN/Cr 28/1.36. EGFR 40. Na/K 137/4.8. Albumin 3.2. Rest of the CMP normal H/H 9.8/31.1. MCV 95.7. Platelets 243   01/11/2020: Glucose 210, BUN/Cr 21/1.44. EGFR 37. Na/K 138/4.4.  H/H 10.4/34.4. MCV 91. Platelets  165  12/29/2019: Glucose 124, BUN/Cr 29/1.67. EGFR 31. Na/K 140/4.8.  H/H 12/38. MCV 87. Platelets 290  12/23/2019: Glucose 75, BUN/Cr 19/1.1. EGFR 52. Na/K 143/3.6. Rest of the CMP normal H/H 7.9/27.1. MCV 88. Platelets 233 HbA1C 8.2%  2015: TSH 3.8normal  2010: Chol 142, TG 189, HDL 36, LDL 68    Review of Systems  Cardiovascular: Negative for chest pain, dyspnea on exertion, leg swelling, palpitations and syncope.         Vitals:   05/23/20 1033  BP: (!) 93/51  Pulse: 74  Resp: 16  SpO2: 94%      Body mass index is 22.87 kg/m. Filed Weights   05/23/20 1033  Weight: 146 lb (66.2 kg)     Objective:   Physical Exam Vitals and nursing note reviewed.  Constitutional:      Appearance: She is well-developed.  Neck:     Vascular: No JVD.  Cardiovascular:     Rate and Rhythm: Normal rate and regular rhythm.     Pulses: Intact distal pulses.     Heart sounds: Normal heart sounds. No murmur heard.   Pulmonary:     Effort: Pulmonary effort is normal.     Breath sounds: Normal breath sounds. No wheezing or rales.  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 Caucasian female  with CAD (RCA CTO),  controlled hypertension, hyperlipidemia, type 2 diabetes mellitus, h/o GI Bleed,  former smoker, liver cirrhosis, hypothyroidism, COPD/asthma, depression, lung mass.  CAD: No angina symptoms at this time. Given lack of symptoms, concern for ongoing melena, and undifferentiated lung mass, I do not recommend complex PCI to her RCA CTO.  Given melena, I have stopped her plavix. Also not on regular Aspirin due to anemia and h/o GI bleed. She occasionally takes Excedrin migraine. Avoid ibuprofen. Continue metoprlolol 12.5 mg bid. Switch pravastatin to rosuvastatin 20 mg daily.  Will check lipid panel and LFT, given diagnosis of cirrhosis.  Lung mass: Awaiting  pathology results.  F/u in 6 months  Kolt Mcwhirter Esther Hardy, MD [facility] Pager: [phone] Office: [phone]

## 2020-05-23 NOTE — Telephone Encounter (Signed)
Blood work from [facility] didn't result in pts chart. Results were placed on Neil Crouch, [place] desk. Hemoglobin 10.5, Hematocrit 32.7, Glucose 103, Bun 38, Creatinine 1.65.

## 2020-05-24 ENCOUNTER — Ambulatory Visit (HOSPITAL_COMMUNITY): Payer: Medicare HMO

## 2020-05-24 ENCOUNTER — Telehealth: Payer: Self-pay | Admitting: Internal Medicine

## 2020-05-24 NOTE — Telephone Encounter (Signed)
Did patient change her last name since she was here on 05/17/20? Her last name was [name] when I saw her.

## 2020-05-24 NOTE — Telephone Encounter (Signed)
Received a new pt referral from Dr. Lamonte Sakai at [facility] for small cell lung cancer. [name] has been scheduled to see Dr. [name] on 8/16 at 215pm w/labs at 145pm. Appt date and time has been provided to the pt's friend. Aware to arrive 15 minutes early.

## 2020-05-24 NOTE — Telephone Encounter (Signed)
Yes, pts last name changed. Pt got a divorce per pt.

## 2020-05-24 NOTE — Telephone Encounter (Signed)
Called to speak with [name], review bronchoscopy results.  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  No answer, left message, will call back.

## 2020-05-24 NOTE — Telephone Encounter (Signed)
Called teresa at [phone], still unable to reach her.   I was able to reach [place] on [facility]. Explained dx of SCLCA. She states that the patient is willing to hear options for treatment, although she may ultimately decide no to seek rx. I will refer to [place] to discuss the options.

## 2020-05-25 ENCOUNTER — Other Ambulatory Visit (HOSPITAL_COMMUNITY): Payer: Self-pay | Admitting: Cardiology

## 2020-05-25 NOTE — Telephone Encounter (Signed)
Noted  

## 2020-05-26 ENCOUNTER — Other Ambulatory Visit: Payer: Self-pay | Admitting: Physician Assistant

## 2020-05-26 DIAGNOSIS — R918 Other nonspecific abnormal finding of lung field: Secondary | ICD-10-CM

## 2020-05-26 LAB — LIPID PANEL
Chol/HDL Ratio: 2.9 ratio (ref 0.0–4.4)
Cholesterol, Total: 120 mg/dL (ref 100–199)
HDL: 41 mg/dL (ref >39)
LDL Chol Calc (NIH): 62 mg/dL (ref 0–99)
Triglycerides: 90 mg/dL (ref 0–149)
VLDL Cholesterol Cal: 17 mg/dL (ref 5–40)

## 2020-05-26 LAB — COMPREHENSIVE METABOLIC PANEL
ALT: 12 IU/L (ref 0–32)
AST: 11 IU/L (ref 0–40)
Albumin/Globulin Ratio: 1.1 — ABNORMAL LOW (ref 1.2–2.2)
Albumin: 3.6 g/dL — ABNORMAL LOW (ref 3.8–4.8)
Alkaline Phosphatase: 83 IU/L (ref 48–121)
BUN/Creatinine Ratio: 21 (ref 12–28)
BUN: 26 mg/dL (ref 8–27)
Bilirubin Total: 0.3 mg/dL (ref 0.0–1.2)
CO2: 19 mmol/L — ABNORMAL LOW (ref 20–29)
Calcium: 10 mg/dL (ref 8.7–10.3)
Chloride: 103 mmol/L (ref 96–106)
Creatinine, Ser: 1.25 mg/dL — ABNORMAL HIGH (ref 0.57–1.00)
GFR calc Af Amer: 51 mL/min/{1.73_m2} — ABNORMAL LOW (ref >59)
GFR calc non Af Amer: 44 mL/min/{1.73_m2} — ABNORMAL LOW (ref >59)
Globulin, Total: 3.3 g/dL (ref 1.5–4.5)
Glucose: 112 mg/dL — ABNORMAL HIGH (ref 65–99)
Potassium: 5.3 mmol/L — ABNORMAL HIGH (ref 3.5–5.2)
Sodium: 138 mmol/L (ref 134–144)
Total Protein: 6.9 g/dL (ref 6.0–8.5)

## 2020-05-26 LAB — TSH: TSH: 0.063 u[IU]/mL — ABNORMAL LOW (ref 0.450–4.500)

## 2020-05-30 ENCOUNTER — Other Ambulatory Visit: Payer: Self-pay

## 2020-05-30 ENCOUNTER — Telehealth: Payer: Self-pay | Admitting: Physician Assistant

## 2020-05-30 ENCOUNTER — Other Ambulatory Visit: Payer: Self-pay | Admitting: Physician Assistant

## 2020-05-30 ENCOUNTER — Encounter: Payer: Self-pay | Admitting: Internal Medicine

## 2020-05-30 ENCOUNTER — Inpatient Hospital Stay: Payer: Medicare HMO | Attending: Internal Medicine

## 2020-05-30 ENCOUNTER — Inpatient Hospital Stay (HOSPITAL_BASED_OUTPATIENT_CLINIC_OR_DEPARTMENT_OTHER): Payer: Medicare HMO | Admitting: Internal Medicine

## 2020-05-30 DIAGNOSIS — I2510 Atherosclerotic heart disease of native coronary artery without angina pectoris: Secondary | ICD-10-CM | POA: Insufficient documentation

## 2020-05-30 DIAGNOSIS — Z5189 Encounter for other specified aftercare: Secondary | ICD-10-CM | POA: Diagnosis not present

## 2020-05-30 DIAGNOSIS — F329 Major depressive disorder, single episode, unspecified: Secondary | ICD-10-CM | POA: Insufficient documentation

## 2020-05-30 DIAGNOSIS — E1151 Type 2 diabetes mellitus with diabetic peripheral angiopathy without gangrene: Secondary | ICD-10-CM | POA: Diagnosis not present

## 2020-05-30 DIAGNOSIS — Z794 Long term (current) use of insulin: Secondary | ICD-10-CM | POA: Insufficient documentation

## 2020-05-30 DIAGNOSIS — Z808 Family history of malignant neoplasm of other organs or systems: Secondary | ICD-10-CM

## 2020-05-30 DIAGNOSIS — Z5111 Encounter for antineoplastic chemotherapy: Secondary | ICD-10-CM | POA: Diagnosis not present

## 2020-05-30 DIAGNOSIS — Z801 Family history of malignant neoplasm of trachea, bronchus and lung: Secondary | ICD-10-CM | POA: Diagnosis not present

## 2020-05-30 DIAGNOSIS — R599 Enlarged lymph nodes, unspecified: Secondary | ICD-10-CM | POA: Insufficient documentation

## 2020-05-30 DIAGNOSIS — Z87891 Personal history of nicotine dependence: Secondary | ICD-10-CM | POA: Insufficient documentation

## 2020-05-30 DIAGNOSIS — N289 Disorder of kidney and ureter, unspecified: Secondary | ICD-10-CM

## 2020-05-30 DIAGNOSIS — R918 Other nonspecific abnormal finding of lung field: Secondary | ICD-10-CM

## 2020-05-30 DIAGNOSIS — I252 Old myocardial infarction: Secondary | ICD-10-CM | POA: Diagnosis not present

## 2020-05-30 DIAGNOSIS — J449 Chronic obstructive pulmonary disease, unspecified: Secondary | ICD-10-CM | POA: Diagnosis not present

## 2020-05-30 DIAGNOSIS — Z7189 Other specified counseling: Secondary | ICD-10-CM

## 2020-05-30 DIAGNOSIS — C342 Malignant neoplasm of middle lobe, bronchus or lung: Secondary | ICD-10-CM | POA: Diagnosis not present

## 2020-05-30 DIAGNOSIS — I739 Peripheral vascular disease, unspecified: Secondary | ICD-10-CM | POA: Diagnosis not present

## 2020-05-30 DIAGNOSIS — Z86718 Personal history of other venous thrombosis and embolism: Secondary | ICD-10-CM | POA: Diagnosis not present

## 2020-05-30 DIAGNOSIS — Z89111 Acquired absence of right hand: Secondary | ICD-10-CM

## 2020-05-30 DIAGNOSIS — C3490 Malignant neoplasm of unspecified part of unspecified bronchus or lung: Secondary | ICD-10-CM

## 2020-05-30 DIAGNOSIS — I119 Hypertensive heart disease without heart failure: Secondary | ICD-10-CM

## 2020-05-30 HISTORY — DX: Other specified counseling: Z71.89

## 2020-05-30 LAB — CBC WITH DIFFERENTIAL (CANCER CENTER ONLY)
Abs Immature Granulocytes: 0.02 10*3/uL (ref 0.00–0.07)
Basophils Absolute: 0 10*3/uL (ref 0.0–0.1)
Basophils Relative: 0 %
Eosinophils Absolute: 0.2 10*3/uL (ref 0.0–0.5)
Eosinophils Relative: 2 %
HCT: 28.8 % — ABNORMAL LOW (ref 36.0–46.0)
Hemoglobin: 9.2 g/dL — ABNORMAL LOW (ref 12.0–15.0)
Immature Granulocytes: 0 %
Lymphocytes Relative: 18 %
Lymphs Abs: 1.7 10*3/uL (ref 0.7–4.0)
MCH: 29.3 pg (ref 26.0–34.0)
MCHC: 31.9 g/dL (ref 30.0–36.0)
MCV: 91.7 fL (ref 80.0–100.0)
Monocytes Absolute: 0.7 10*3/uL (ref 0.1–1.0)
Monocytes Relative: 7 %
Neutro Abs: 6.8 10*3/uL (ref 1.7–7.7)
Neutrophils Relative %: 73 %
Platelet Count: 256 10*3/uL (ref 150–400)
RBC: 3.14 MIL/uL — ABNORMAL LOW (ref 3.87–5.11)
RDW: 13.4 % (ref 11.5–15.5)
WBC Count: 9.4 10*3/uL (ref 4.0–10.5)
nRBC: 0 % (ref 0.0–0.2)

## 2020-05-30 LAB — CMP (CANCER CENTER ONLY)
ALT: 11 U/L (ref 0–44)
AST: 11 U/L — ABNORMAL LOW (ref 15–41)
Albumin: 3.1 g/dL — ABNORMAL LOW (ref 3.5–5.0)
Alkaline Phosphatase: 77 U/L (ref 38–126)
Anion gap: 7 (ref 5–15)
BUN: 28 mg/dL — ABNORMAL HIGH (ref 8–23)
CO2: 18 mmol/L — ABNORMAL LOW (ref 22–32)
Calcium: 10.3 mg/dL (ref 8.9–10.3)
Chloride: 107 mmol/L (ref 98–111)
Creatinine: 1.49 mg/dL — ABNORMAL HIGH (ref 0.44–1.00)
GFR, Est AFR Am: 41 mL/min — ABNORMAL LOW (ref >60)
GFR, Estimated: 36 mL/min — ABNORMAL LOW (ref >60)
Glucose, Bld: 175 mg/dL — ABNORMAL HIGH (ref 70–99)
Potassium: 5.5 mmol/L — ABNORMAL HIGH (ref 3.5–5.1)
Sodium: 132 mmol/L — ABNORMAL LOW (ref 135–145)
Total Bilirubin: 0.2 mg/dL — ABNORMAL LOW (ref 0.3–1.2)
Total Protein: 7 g/dL (ref 6.5–8.1)

## 2020-05-30 NOTE — Telephone Encounter (Signed)
I called the patient as well as her caregiver to let them know about her lab work from today. Her potassium was high at 5.5. I spoke to the patient and she states she takes potassium supplements. We will re-check her potassium in 2 days. She was also instructed to hold her potassium for the next few days until it can be rechecked. I would also advise her to follow up with her PCP for consideration of management of her medications which can cause electrolyte abnormalities (lisinopril) and to monitor her electrolytes. The patient ask that I relay this information to her caregiver who I was unable to reach by phone but left a message with the instructions and ask that she call back if she has any questions. Pending a call back.

## 2020-05-30 NOTE — Progress Notes (Signed)
Pt arrived to room with another person.  This person states she is the patient's 'health advocate'. She states her name is [name]. When asked how the patient was feeling today, the advocate stated she (the patient) was very upset.  The advocate states that the patient was told she  could not bring her dog back with her. The advocate states it is an emotional support dog.  The advocate states she was told that the reason she was given for not bringing the dog back was because the doctor didn't  want dogs in the clinic because of his 'culture'. The advocate stated she already does not like the cancer center or the physician. Assurance given to the advocate that it is not the provider who does not want the dog back in the clinic. It is the policy of the clinic that it has to be an approved 'Service Dog' with the appropriate paper work, and with arrangements made ahead of time. The advocate remained irritated and would consistently patient speak for patient. Suggested that the patient speak for herself when questions asked.  The patient was  capable of answering questions about her dog and her health.

## 2020-05-30 NOTE — Progress Notes (Signed)
[place] [phone]   [phone]  CONSULT NOTE  REFERRING PHYSICIAN: Dr. Chesley Mires  REASON FOR CONSULTATION:  69 years old white female recently diagnosed with lung cancer.  HPI [name] is a 69 y.o. female with past medical history significant for COPD, diabetes mellitus, depression, degenerative disc disease, atherosclerotic cardiovascular disease, and anemia, amputation of the right hand, and anxiety and depression, deep venous thrombosis, hypertension, hypothyroidism, myocardial infarction, history of kidney stones as well as dyslipidemia and peripheral vascular disease.  The patient came today accompanied by another person, [name] who presented herself as the patient's advocate and started to speak for her.  The patient was very nice but her advocate was very hostile from the moment I entered the room and accused me of not allowing the patient to have her dog with her during the visit because of a ??? cultural issue even it was explained to the patient and her advocate at the entrance of the cancer center and registration that it is the health system policy which was presented to her not allow any dog unless they are a service dog and she has to present see documentation supporting that.  I also explained to them that I have nothing to do with the dog situation and it is not my decision to allow it or not.  The patient's advocate also mentions that she will take the patient to a different cancer center that allows her to have her dog with her during the visit and treatment.  I tried my best to cool down the situation but she was very strong in her opinion. The patient mentioned that she had CT scan of the chest without contrast on December 19, 2019 for evaluation of shortness of breath and dizzy spells.  The scan showed focal consolidation in the right middle lobe most consistent with pneumonia.  A mass or malignancy is not excluded.  There was also  small bilateral pleural effusions and findings of interstitial edema.  There was mildly enlarged bilateral hilar and mediastinal lymph nodes likely reactive.  She was referred to Dr. [name] and repeat CT scan of the chest without contrast on 04/12/2020 showed interval increase in the size of the masslike consolidation within the right middle lobe measured 5.5 x 4.2 x 3.4 cm.  There was persistent asymmetric enlargement of the right hilum concerning for adenopathy.  On 05/02/2020 the patient had a PET scan and it showed lateral segment right middle lobe mass measuring 4.6 x 5.8 cm with SUV max of 17.0.  There was new area of consolidation in the medial right lower lobe mildly hypermetabolic and likely infectious or inflammatory.  There was also hypermetabolic low right paratracheal lymph node measuring 1.7 cm with SUV max of 32.3 and hypermetabolic right hilar lymph node difficult to measure on the CT scan but has SUV max of 20.5.  On 05/20/2020 the patient underwent video bronchoscopy with endobronchial ultrasound and electromagnetic navigation procedure under the care of Dr. Lamonte Sakai. 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 The patient was referred to me today for evaluation and recommendation regarding treatment of her condition. When seen today the patient is complaining of fatigue as well as intermittent chest pain and cough productive of whitish sputum but no significant shortness of breath or hemoptysis.  She continues to have intermittent headache and occasional nausea.  She is followed by gastroenterology in [place] for history of persistent diarrhea.  The  patient has no recent weight loss or night sweats. Family history significant for mother died from lung cancer at age 50.  Father died from alcoholic complication and aneurysm.  She has 2 uncles one with lung cancer and the other one with brain cancer. The patient is single and has 2 children a son  and daughter.  She was to work as a [employer] manager.  She has a history for smoking up to 3 packs/day for around 50 years and quit 5 years ago.  She has no history of alcohol or drug abuse.  HPI  Past Medical History:  Diagnosis Date  . Allergic rhinitis   . 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 Anemia   . Anxiety   . ASCVD (arteriosclerotic cardiovascular disease)    MI in 96 requiring BMS CX; DES to M1 in 2000;normal coronary angiography in 2004  . Cholelithiasis   . COPD (chronic obstructive pulmonary disease) ([place])   . DDD (degenerative disc disease), lumbar   . Depression   . Diabetes mellitus    Type II  . Diarrhea   . DVT (deep venous thrombosis) ([place])    patient said no  . GERD (gastroesophageal reflux disease)   . Headache    migraine  . History of kidney stones    2006  . Hyperlipidemia   . Hypotension   . Hypothyroidism   . Low back pain   . Myocardial infarction ([place]) 01/1995  . Nephrolithiasis 2006   stone extraction   . Panic attacks   . Peripheral neuropathy   . Peripheral vascular disease ([facility])    legs  . Pneumonia 12/22/2019  . Sciatic pain    right  . Tobacco abuse   . Tremor     Past Surgical History:  Procedure Laterality Date  . AGILE CAPSULE N/A 03/07/2020   Procedure: AGILE CAPSULE;  Surgeon: Daneil Dolin, MD;  [facility];  Service: Endoscopy;  Laterality: N/A;  7:30am  . BACK SURGERY     fusion  . BRONCHIAL NEEDLE ASPIRATION BIOPSY N/A 05/20/2020   Procedure: BRONCHIAL NEEDLE ASPIRATION BIOPSIES;  Surgeon: Collene Gobble, MD;  Location: [facility] ENDOSCOPY;  Service: Pulmonary;  Laterality: N/A;  . CHOLECYSTECTOMY    . COLONOSCOPY  01/2009   TKP:TWSFKC rectum/repeat in 5 yrs  . COLONOSCOPY N/A 04/29/2014   Dr.Rourk- attempted/incomplete colonoscopy. inadequate prep  . COLONOSCOPY N/A 05/27/2014   LEX:NTZGYFVCB coli. Colonic polyps-removed as described above.Status post segmental biopsy. single tubular adenoma and random colon bx neg   . COLONOSCOPY WITH PROPOFOL N/A 12/23/2019   Procedure: COLONOSCOPY WITH PROPOFOL;  Surgeon: Ronnette Juniper, MD;  Location: [place];  Service: Gastroenterology;  Laterality: N/A;  . DILATION AND CURETTAGE OF UTERUS  1974  . ESOPHAGOGASTRODUODENOSCOPY  05/2010   Dr. Tessie Fass, erosion. 3F dilation  . ESOPHAGOGASTRODUODENOSCOPY N/A 04/29/2014   Dr.Rourk- normal esophagus s/p passage of maloney dilator. small hiatal hernia- bx= chronic inflammation.  . ESOPHAGOGASTRODUODENOSCOPY (EGD) WITH PROPOFOL N/A 12/23/2019   Procedure: ESOPHAGOGASTRODUODENOSCOPY (EGD) WITH PROPOFOL;  Surgeon: Ronnette Juniper, MD;  Location: [facility];  Service: Gastroenterology;  Laterality: N/A;  . GIVENS CAPSULE STUDY N/A 03/22/2020   Procedure: GIVENS CAPSULE STUDY;  Surgeon: Daneil Dolin, MD;  [facility];  Service: Endoscopy;  Laterality: N/A;  7:30am  . HEMOSTASIS CLIP PLACEMENT  12/23/2019   Procedure: HEMOSTASIS CLIP PLACEMENT;  Surgeon: Ronnette Juniper, MD;  Location: [facility];  Service: Gastroenterology;;  . HOT HEMOSTASIS N/A 12/23/2019  Procedure: HOT HEMOSTASIS (ARGON PLASMA COAGULATION/BICAP);  Surgeon: Ronnette Juniper, MD;  Location: [facility];  Service: Gastroenterology;  Laterality: N/A;  . LEFT HEART CATH AND CORONARY ANGIOGRAPHY N/A 12/24/2019   Procedure: LEFT HEART CATH AND CORONARY ANGIOGRAPHY;  Surgeon: Nigel Mormon, MD;  Location: [facility] LAB;  Service: Cardiovascular;  Laterality: N/A;  . Venia Minks DILATION N/A 04/29/2014   Procedure: Venia Minks DILATION;  Surgeon: Daneil Dolin, MD;  [facility];  Service: Endoscopy;  Laterality: N/A;  . PARTIAL HYSTERECTOMY  1978  . POLYPECTOMY  12/23/2019   Procedure: POLYPECTOMY;  Surgeon: Ronnette Juniper, MD;  Location: [facility] ENDOSCOPY;  Service: Gastroenterology;;  . SHOULDER SURGERY Left    Left shoulder for RTC;left arm surgery '98/left hand surgery 2001  . TOTAL ABDOMINAL HYSTERECTOMY W/ BILATERAL SALPINGOOPHORECTOMY  2002  .  UMBILICAL HERNIA REPAIR  2008  . VIDEO BRONCHOSCOPY WITH ENDOBRONCHIAL ULTRASOUND N/A 05/20/2020   Procedure: VIDEO BRONCHOSCOPY WITH ENDOBRONCHIAL ULTRASOUND;  Surgeon: Collene Gobble, MD;  Location: [facility] ENDOSCOPY;  Service: Pulmonary;  Laterality: N/A;    Family History  Problem Relation Age of Onset  . Depression Mother   . Bipolar disorder Mother   . Dementia Mother   . Pulmonary fibrosis Mother   . Alcohol abuse Father   . Aneurysm Father        deceased age 69, brain  . Colon cancer Paternal Grandfather        age greater than 72    Social History Social History   Tobacco Use  . Smoking status: Former Smoker    Packs/day: 0.50    Years: 40.00    Pack years: 20.00    Types: Cigarettes    Quit date: 08/30/2017    Years since quitting: 2.7  . Smokeless tobacco: Never Used  Vaping Use  . Vaping Use: Never used  Substance Use Topics  . Alcohol use: No  . Drug use: No    Allergies  Allergen Reactions  . Iohexol      Desc: CHEST TIGHTNESS,BRETHING PROBLEMS NEEDS PRE MEDS   . Tape Other (See Comments)    Tears skin  . Ciprofloxacin Rash  . Latex Rash    GLOVES   . Penicillins Swelling    Did it involve swelling of the face/tongue/throat, SOB, or low BP? Yes Did it involve sudden or severe rash/hives, skin peeling, or any reaction on the inside of your mouth or nose? No Did you need to seek medical attention at a hospital or doctor's office? Yes When did it last happen?1996 If all above answers are "NO", may proceed with cephalosporin use.  . Povidone-Iodine Rash    Current Outpatient Medications  Medication Sig Dispense Refill  . albuterol (PROVENTIL HFA;VENTOLIN HFA) 108 (90 BASE) MCG/ACT inhaler Inhale 2 puffs into the lungs every 4 (four) hours as needed for wheezing or shortness of breath. 1 Inhaler 0  . aspirin-acetaminophen-caffeine (EXCEDRIN MIGRAINE) 250-250-65 MG tablet Take 2 tablets by mouth daily as needed for headache. Up to 4 times a week     . azelastine (OPTIVAR) 0.05 % ophthalmic solution Place 1 drop into both eyes 2 (two) times daily.    Marland Kitchen b complex vitamins tablet Take 1 tablet by mouth daily. With Zinc and vitamin C    . Benzocaine (BOIL-EASE EX) Apply 1 application topically daily as needed (Boil).    . Biotin 5000 MCG TABS Take 10,000 mcg by mouth daily. Keratin    . Cholecalciferol (VITAMIN D3) 50 MCG (  2000 UT) TABS Take 4,000 Units by mouth daily.    . colestipol (COLESTID) 5 g packet Take 3 g by mouth daily. Do not take within 2 hours of other medications. 18 packet 0  . Cyanocobalamin 2500 MCG CHEW Chew 2,500 mcg by mouth daily.    . diphenhydrAMINE (BENADRYL) 50 MG capsule Take 37m 1 hour before scheduled CT (Patient not taking: Reported on 05/23/2020) 1 capsule 0  . ferrous sulfate 325 (65 FE) MG tablet Take 325 mg by mouth daily with breakfast.    . furosemide (LASIX) 40 MG tablet Take 1 tablet (40 mg total) by mouth 2 (two) times daily. (Patient taking differently: Take 40 mg by mouth daily. ) 30 tablet 1  . Ginkgo Biloba Extract (GNP GINGKO BILOBA EXTRACT) 60 MG CAPS Take 1 capsule by mouth daily.    . hydrocortisone cream 1 % Apply 1 application topically daily as needed for itching.    .Marland Kitchenibuprofen (ADVIL) 200 MG tablet Take 200-600 mg by mouth every 6 (six) hours as needed for moderate pain.    .Marland KitchenLANTUS SOLOSTAR 100 UNIT/ML Solostar Pen Inject 30 Units into the skin daily.     .Marland Kitchenlevothyroxine (SYNTHROID) 125 MCG tablet Take 125 mcg by mouth daily.    .Marland Kitchenlisinopril (ZESTRIL) 20 MG tablet Take 20 mg by mouth at bedtime.    .Marland KitchenLORazepam (ATIVAN) 1 MG tablet Take 1 mg by mouth 2 (two) times daily.    . Menthol, Topical Analgesic, (ICY HOT) 7.5 % (Roll) MISC Apply 1 each topically daily as needed (Pain). spray (Patient not taking: Reported on 05/23/2020)    . metFORMIN (GLUCOPHAGE) 500 MG tablet Take 500 mg by mouth 2 (two) times daily with a meal.     . Multiple Vitamins-Minerals (MULTIVITAMIN WITH MINERALS) tablet Take 1  tablet by mouth daily. Adult 50+    . neomycin-bacitracin-polymyxin (NEOSPORIN) OINT Apply 1 application topically daily as needed for irritation or wound care.    . Omega 3-6-9 Fatty Acids (OMEGA 3-6-9 COMPLEX PO) Take 2 tablets by mouth daily.    .Marland KitchenoxyCODONE-acetaminophen (PERCOCET/ROXICET) 5-325 MG tablet Take 0.5 tablets by mouth 2 (two) times daily as needed for severe pain.    . pantoprazole (PROTONIX) 40 MG tablet Take 40 mg by mouth daily.    .Marland KitchenPARoxetine (PAXIL) 20 MG tablet Take 20 mg by mouth daily.     . predniSONE (DELTASONE) 50 MG tablet Take 50 mg 13 hours, 7 hours, and 1 hour before scheduled CT scan (Patient not taking: Reported on 05/23/2020) 3 tablet 0  . rosuvastatin (CRESTOR) 10 MG tablet Take 1 tablet (10 mg total) by mouth daily. 90 tablet 3  . traZODone (DESYREL) 150 MG tablet Take 150 mg by mouth at bedtime.      No current facility-administered medications for this visit.    Review of Systems  Constitutional: positive for fatigue Eyes: negative Ears, nose, mouth, throat, and face: negative Respiratory: positive for cough and pleurisy/chest pain Cardiovascular: negative Gastrointestinal: positive for diarrhea and nausea Genitourinary:negative Integument/breast: negative Hematologic/lymphatic: negative Musculoskeletal:negative Neurological: positive for headaches Behavioral/Psych: positive for anxiety Endocrine: negative Allergic/Immunologic: negative  Physical Exam  RLEX:NTZGY healthy, no distress, well nourished, well developed and anxious SKIN: skin color, texture, turgor are normal, no rashes or significant lesions HEAD: Normocephalic, No masses, lesions, tenderness or abnormalities EYES: normal, Conjunctiva are pink and non-injected EARS: External ears normal, Canals clear OROPHARYNX:no exudate, no erythema and lips, buccal mucosa, and tongue normal  NECK: supple,  no adenopathy, no JVD LYMPH:  no palpable lymphadenopathy, no  hepatosplenomegaly BREAST:not examined LUNGS: clear to auscultation , and palpation HEART: regular rate & rhythm, no murmurs and no gallops ABDOMEN:abdomen soft, non-tender, normal bowel sounds and no masses or organomegaly BACK: Back symmetric, no curvature., No CVA tenderness EXTREMITIES: Right hand amputation. NEURO: alert & oriented x 3 with fluent speech, no focal motor/sensory deficits  PERFORMANCE STATUS: ECOG 1  LABORATORY DATA: Lab Results  Component Value Date   WBC 8.1 05/20/2020   HGB 9.8 (L) 05/20/2020   HCT 31.1 (L) 05/20/2020   MCV 95.7 05/20/2020   PLT 243 05/20/2020      Chemistry      Component Value Date/Time   NA 138 05/25/2020 0806   K 5.3 (H) 05/25/2020 0806   CL 103 05/25/2020 0806   CO2 19 (L) 05/25/2020 0806   BUN 26 05/25/2020 0806   CREATININE 1.25 (H) 05/25/2020 0806   CREATININE 0.85 05/03/2012 0942      Component Value Date/Time   CALCIUM 10.0 05/25/2020 0806   ALKPHOS 83 05/25/2020 0806   AST 11 05/25/2020 0806   ALT 12 05/25/2020 0806   BILITOT 0.3 05/25/2020 0806       RADIOGRAPHIC STUDIES: NM PET Image Initial (PI) Skull Base To Thigh  Result Date: 05/02/2020 CLINICAL DATA:  Initial treatment strategy for lung nodule. EXAM: NUCLEAR MEDICINE PET SKULL BASE TO THIGH TECHNIQUE: 7.2 mCi F-18 FDG was injected intravenously. Full-ring PET imaging was performed from the skull base to thigh after the radiotracer. CT data was obtained and used for attenuation correction and anatomic localization. Fasting blood glucose: 128 mg/dl COMPARISON:  CT chest 04/12/2020 and 12/19/2019. FINDINGS: Mediastinal blood pool activity: SUV max 2.3 Liver activity: SUV max NA NECK: No abnormal hypermetabolism. Incidental CT findings: None. CHEST: Mild hypermetabolism the thyroid. Hypermetabolic low right paratracheal lymph node measures 1.7 cm with an SUV max of 14.7. Hypermetabolic right hilar lymph node is difficult to measure on CT but has an SUV max of 20.5.  Lateral segment right middle lobe mass measures approximately 4.6 x 5.8 cm with an SUV max of 17.0. New small area of consolidation in the medial right lower lobe (4/88) is mildly hypermetabolic and likely infectious or inflammatory given its relative absence on 04/12/2020. Incidental CT findings: Atherosclerotic calcification of the aorta, aortic valve and coronary arteries. Heart is at the upper limits of normal in size to mildly enlarged. No pericardial effusion. Trace bilateral pleural effusions. Centrilobular and paraseptal emphysema. ABDOMEN/PELVIS: No abnormal hypermetabolism in the liver, adrenal glands, spleen or pancreas. No hypermetabolic lymph nodes. Incidental CT findings: Liver margin is irregular. Cholecystectomy. Adrenal glands, kidneys, spleen, pancreas stomach and bowel are grossly unremarkable. Atherosclerotic calcification of the aorta without aneurysm. SKELETON: No abnormal hypermetabolism. Incidental CT findings: Degenerative changes in the spine. No worrisome lytic or sclerotic lesions. IMPRESSION: 1. 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 2. Trace bilateral pleural effusions. 3. Cirrhosis. 4. Mildly hypermetabolic thyroid.  Consider laboratory correlation. 5. Aortic atherosclerosis (ICD10-I70.0). Coronary artery calcification. 6.  Emphysema (ICD10-J43.9). Electronically Signed   By: Lorin Picket M.D.   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   PLAN: I had a lengthy discussion with the patient and her advocate [name] about her current disease stage, prognosis and treatment options.  I  personally and independently reviewed the scan images with the patient and her companion. I recommended for the patient to complete the staging work-up by ordering MRI of the brain to rule out brain metastasis. I explained to the patient if the MRI of the brain is negative for metastatic disease, she would have a limited stage small cell lung cancer which she has a potential curative outcome. I recommended for the patient a course of systemic chemotherapy with carboplatin for AUC of 5 on day 1, etoposide 100 mg/M2 on days 1, 2 and 3 with Neulasta support.  The patient will not be able to tolerate cisplatin because of her renal insufficiency. I discussed with the patient the adverse effect of this treatment including but not limited to alopecia, myelosuppression, nausea and vomiting, peripheral neuropathy, liver or renal dysfunction. She would like to start the treatment as soon as possible and she is expected to start the first dose of this treatment next week. I will arrange for the patient to have a chemotherapy education class before the first dose of her treatment. We may also consider the patient for Port-A-Cath placement if she has poor IV access especially with the amputation of the right hand. I will refer the patient to radiation oncology for evaluation and discussion of the concurrent course of radiotherapy for the limited stage disease. The patient will come back for follow-up visit in 2 weeks for evaluation and management of any adverse effect of her treatment. I will call her pharmacy with prescription for Compazine 10 mg p.o. every 6 hours as needed for nausea. She was advised to call immediately if she has any concerning symptoms in the interval. The patient voices understanding of current disease status and treatment options and is in agreement with the current care plan.  All questions were answered. The patient knows to call the clinic with any problems, questions or concerns. We can  certainly see the patient much sooner if necessary.  Thank you so much for allowing me to participate in the care of [employer]. I will continue to follow up the patient with you and assist in her care.  The total time spent in the appointment was 90 minutes.  Disclaimer: This note was dictated with voice recognition software. Similar sounding words can inadvertently be transcribed and may not be corrected upon review.   Eilleen Kempf May 30, 2020, 2:02 PM

## 2020-05-30 NOTE — Progress Notes (Signed)
START ON PATHWAY REGIMEN - Small Cell Lung     A cycle is every 21 days:     Carboplatin      Etoposide   **Always confirm dose/schedule in your pharmacy ordering system**  Patient Characteristics: Newly Diagnosed, Preoperative or Nonsurgical Candidate (Clinical Staging), First Line, Limited Stage, Nonsurgical Candidate Therapeutic Status: Newly Diagnosed, Preoperative or Nonsurgical Candidate (Clinical Staging) AJCC T Category: cT3 AJCC N Category: cN2 AJCC M Category: cM0 AJCC 8 Stage Grouping: IIIB Stage Classification: Limited Surgical Candidacy: Nonsurgical Candidate Intent of Therapy: Curative Intent, Discussed with Patient

## 2020-05-31 ENCOUNTER — Encounter: Payer: Self-pay | Admitting: *Deleted

## 2020-05-31 ENCOUNTER — Ambulatory Visit (INDEPENDENT_AMBULATORY_CARE_PROVIDER_SITE_OTHER): Payer: Medicare HMO | Admitting: Pulmonary Disease

## 2020-05-31 ENCOUNTER — Other Ambulatory Visit: Payer: Self-pay | Admitting: *Deleted

## 2020-05-31 ENCOUNTER — Encounter: Payer: Self-pay | Admitting: Pulmonary Disease

## 2020-05-31 ENCOUNTER — Other Ambulatory Visit: Payer: Self-pay | Admitting: Internal Medicine

## 2020-05-31 VITALS — BP 108/58 | HR 68 | Temp 99.0°F | Ht 66.0 in | Wt 144.6 lb

## 2020-05-31 DIAGNOSIS — C3490 Malignant neoplasm of unspecified part of unspecified bronchus or lung: Secondary | ICD-10-CM

## 2020-05-31 DIAGNOSIS — J432 Centrilobular emphysema: Secondary | ICD-10-CM

## 2020-05-31 MED ORDER — LORAZEPAM 0.5 MG PO TABS
ORAL_TABLET | ORAL | 0 refills | Status: DC
Start: 2020-05-31 — End: 2020-11-23

## 2020-05-31 MED ORDER — TRELEGY ELLIPTA 100-62.5-25 MCG/INH IN AEPB
1.0000 | INHALATION_SPRAY | Freq: Every day | RESPIRATORY_TRACT | 0 refills | Status: DC
Start: 2020-05-31 — End: 2020-06-27

## 2020-05-31 MED ORDER — TRELEGY ELLIPTA 100-62.5-25 MCG/INH IN AEPB: 1.0000 | INHALATION_SPRAY | Freq: Every day | RESPIRATORY_TRACT | 5 refills | Status: DC

## 2020-05-31 NOTE — Patient Instructions (Signed)
Trelegy one puff daily, and rinse your mouth after each use  Follow up in 6 months

## 2020-05-31 NOTE — Progress Notes (Signed)
I spoke with patient and her friend yesterday at her first visit with Dr. [name]. Unfortunately, she has been dx with limit stage small cell lung cancer.  She has been referred to rad onc and I will follow up with them on her appt.  The patient seemed anxious and Dr. [name] spoke with her and explained her treatment plan.  The patient's friend was very aggressive in her tone and mostly spoke at th visit.  She would interrupt Dr. [name] and the patient.  She would at times contradict the patient statements.  An issue they had was that a dog was not allowed into the cancer center. With the COVID cases increasing, we are unable to have animals as well as visitors in the infusion room.  Beth RN states that they said they thought it was Dr. Worthy Flank culture and that is why they could not have the dog.   I did give the patient information on lung cancer and her treatment medications.

## 2020-05-31 NOTE — Progress Notes (Signed)
Faxed order request for BMP to [facility] [phone] with our [facility] account # [id number]  Made patient aware that she can either walk into the clinic and have the lab drawn or she can call Customer Service to set up appointment [phone].  Cassie also wanted to make patient aware that it might be in her best interest to follow up with her PCP as the lisinopril can contribute to hyperkalemia which is the reason for this recheck.

## 2020-05-31 NOTE — Progress Notes (Signed)
[facility]  Chief Complaint  Patient presents with  . Follow-up    productive cough in mornings with white phlegm    Constitutional:  BP (!) 108/58 (BP Location: Left Arm, Cuff Size: Normal)   Pulse 68   Temp 99 F (37.2 C) (Oral)   Ht 5' 6"  (1.676 m)   Wt 144 lb 9.6 oz (65.6 kg)   SpO2 95% Comment: Room air  BMI 23.34 kg/m   Past Medical History:  CAD, HTN, DM type 2, HLD, Hypothyroidism, Depression, ETOH with cirrhosis, DVT, diastolic CHF, Colon polyp, CKD 2, Neuropathy, Tremor  Summary:  [name] is a 69 y.o. female former smoker with lung mass and COPD.  Subjective:   She is here with her friend.  Since last visit she had PFT.  This was difficult to interpret due to coughing, but most consistent with emphysema.  She had EBUS with Dr. Lamonte Sakai.  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  Met with Dr. [name].  She has MRI brain scheduled and then will need to get port-a-cath placed.  She feels that trelegy has helped her cough.  She needs a refill.  She continues to have issues with diarrhea.  She has CT angio abd/pelvis scheduled through GI.  She is concerned that she has this scheduled on the same day as her MRI brain.  She feels she will need something for anxiety to help get her through the imaging studies.  Physical Exam:   Deferred.   Assessment/Plan:   Small cell carcinoma of the lung. - she has therapy and follow up imaging studies being coordinate through Dr. [name] with cancer center - I left a voice message with Dr. Worthy Flank nurse to inform them that [name] would like an anxiety medication to take when she has the MRI brain done  History of tobacco abuse with presumed diagnosis of COPD and emphysema. - she has symptomatic benefit from trelegy; will continue this for now - she had trouble completing recent PFT due to coughing; don't think she needs to have this repeated at present but might need to repeat  at some point  Hx of CAD. - followed by Dr. Virgina Jock with [facility]  Iron deficiency anemia, hx of cirrhosis, diarrhea. - f/u with GI  A total of 23 minutes addressing patient care on the day of the visit.  Follow up:  Patient Instructions  Trelegy one puff daily, and rinse your mouth after each use  Follow up in 6 months   Signature:  Chesley Mires, MD [place] Pager: [phone] 05/31/2020, 1:07 PM  Flow Sheet     Pulmonary tests:   A1AT 02/17/20 >> 131  PFT 05/12/20 >> FEV1 2.20 (87%), FEV1% 77, DLCO 58%.  Poor technique due to coughing and unable to do lung volume testing  Chest imaging:   CT chest 12/19/19 >> enlarged b/l hilar and mediastinal LN up to 18 mm, small b/l effusions, diffuse interstitial and interlobular septal prominence, 4.6 x 4 cm area of consolidation in RML, cirrhosis with small amount of ascites  Cardiac tests:   Echo 12/21/19 >> EF 55 to 60%, grade 2 DD, mild/mod MR  Medications:   Allergies as of 05/31/2020      Reactions   Iohexol     Desc: CHEST TIGHTNESS,BRETHING PROBLEMS NEEDS PRE MEDS   Tape Other (See Comments)   Tears skin   Ciprofloxacin Rash   Latex Rash   GLOVES   Penicillins  Swelling   Did it involve swelling of the face/tongue/throat, SOB, or low BP? Yes Did it involve sudden or severe rash/hives, skin peeling, or any reaction on the inside of your mouth or nose? No Did you need to seek medical attention at a hospital or doctor's office? Yes When did it last happen?1996 If all above answers are "NO", may proceed with cephalosporin use.   Povidone-iodine Rash      Medication List       Accurate as of May 31, 2020  1:07 PM. If you have any questions, ask your nurse or doctor.        albuterol 108 (90 Base) MCG/ACT inhaler Commonly known as: VENTOLIN HFA Inhale 2 puffs into the lungs every 4 (four) hours as needed for wheezing or shortness of breath.     aspirin-acetaminophen-caffeine 250-250-65 MG tablet Commonly known as: EXCEDRIN MIGRAINE Take 2 tablets by mouth daily as needed for headache. Up to 4 times a week   azelastine 0.05 % ophthalmic solution Commonly known as: OPTIVAR Place 1 drop into both eyes 2 (two) times daily.   b complex vitamins tablet Take 1 tablet by mouth daily. With Zinc and vitamin C   Biotin 5000 MCG Tabs Take 10,000 mcg by mouth daily. Keratin   BOIL-EASE EX Apply 1 application topically daily as needed (Boil).   colestipol 5 g packet Commonly known as: COLESTID Take 3 g by mouth daily. Do not take within 2 hours of other medications.   Cyanocobalamin 2500 MCG Chew Chew 2,500 mcg by mouth daily.   diphenhydrAMINE 50 MG capsule Commonly known as: BENADRYL Take 93m 1 hour before scheduled CT   ferrous sulfate 325 (65 FE) MG tablet Take 325 mg by mouth daily with breakfast.   furosemide 40 MG tablet Commonly known as: LASIX Take 1 tablet (40 mg total) by mouth 2 (two) times daily. What changed: when to take this   GNP Gingko Biloba Extract 60 MG Caps Generic drug: Ginkgo Biloba Extract Take 1 capsule by mouth daily.   hydrocortisone cream 1 % Apply 1 application topically daily as needed for itching.   ibuprofen 200 MG tablet Commonly known as: ADVIL Take 200-600 mg by mouth every 6 (six) hours as needed for moderate pain.   Icy Hot 7.5 % (Roll) Misc Generic drug: Menthol (Topical Analgesic) Apply 1 each topically daily as needed (Pain). spray   Lantus SoloStar 100 UNIT/ML Solostar Pen Generic drug: insulin glargine Inject 30 Units into the skin daily.   levothyroxine 100 MCG tablet Commonly known as: SYNTHROID Take 100 mcg by mouth daily before breakfast. What changed: Another medication with the same name was removed. Continue taking this medication, and follow the directions you see here. Changed by: VChesley Mires MD   lisinopril 20 MG tablet Commonly known as: ZESTRIL Take  20 mg by mouth at bedtime.   LORazepam 1 MG tablet Commonly known as: ATIVAN Take 1 mg by mouth 2 (two) times daily.   metFORMIN 500 MG tablet Commonly known as: GLUCOPHAGE Take 500 mg by mouth 2 (two) times daily with a meal.   multivitamin with minerals tablet Take 1 tablet by mouth daily. Adult 50+   neomycin-bacitracin-polymyxin Oint Commonly known as: NEOSPORIN Apply 1 application topically daily as needed for irritation or wound care.   OMEGA 3-6-9 COMPLEX PO Take 2 tablets by mouth daily.   oxyCODONE-acetaminophen 5-325 MG tablet Commonly known as: PERCOCET/ROXICET Take 0.5 tablets by mouth 2 (two) times daily as needed for  severe pain.   pantoprazole 40 MG tablet Commonly known as: PROTONIX Take 40 mg by mouth daily.   PARoxetine 20 MG tablet Commonly known as: PAXIL Take 20 mg by mouth daily.   predniSONE 50 MG tablet Commonly known as: DELTASONE Take 50 mg 13 hours, 7 hours, and 1 hour before scheduled CT scan   rosuvastatin 10 MG tablet Commonly known as: Crestor Take 1 tablet (10 mg total) by mouth daily.   traZODone 150 MG tablet Commonly known as: DESYREL Take 150 mg by mouth at bedtime.   Trelegy Ellipta 100-62.5-25 MCG/INH Aepb Generic drug: Fluticasone-Umeclidin-Vilant Inhale 1 puff into the lungs daily. What changed: how much to take Changed by: Chesley Mires, MD   Trelegy Ellipta 100-62.5-25 MCG/INH Aepb Generic drug: Fluticasone-Umeclidin-Vilant Inhale 1 puff into the lungs daily. What changed: You were already taking a medication with the same name, and this prescription was added. Make sure you understand how and when to take each. Changed by: Chesley Mires, MD   Vitamin D3 50 MCG (2000 UT) Tabs Take 4,000 Units by mouth daily.       Past Surgical History:  She  has a past surgical history that includes Dilation and curettage of uterus (1974); Partial hysterectomy (1978); Total abdominal hysterectomy w/ bilateral salpingoophorectomy  (2002); Shoulder surgery (Left); Cholecystectomy; Umbilical hernia repair (2008); Colonoscopy (01/2009); Esophagogastroduodenoscopy (05/2010); Colonoscopy (N/A, 04/29/2014); Esophagogastroduodenoscopy (N/A, 04/29/2014); maloney dilation (N/A, 04/29/2014); Colonoscopy (N/A, 05/27/2014); Esophagogastroduodenoscopy (egd) with propofol (N/A, 12/23/2019); Colonoscopy with propofol (N/A, 12/23/2019); Hemostasis clip placement (12/23/2019); polypectomy (12/23/2019); Hot hemostasis (N/A, 12/23/2019); LEFT HEART CATH AND CORONARY ANGIOGRAPHY (N/A, 12/24/2019); Agile capsule (N/A, 03/07/2020); Givens capsule study (N/A, 03/22/2020); Back surgery; Bronchial needle aspiration biopsy (N/A, 05/20/2020); and Video bronchoscopy with endobronchial ultrasound (N/A, 05/20/2020).  Family History:  Her family history includes Alcohol abuse in her father; Aneurysm in her father; Bipolar disorder in her mother; Colon cancer in her paternal grandfather; Dementia in her mother; Depression in her mother; Pulmonary fibrosis in her mother.  Social History:  She  reports that she quit smoking about 2 years ago. Her smoking use included cigarettes. She has a 20.00 pack-year smoking history. She has never used smokeless tobacco. She reports that she does not drink alcohol and does not use drugs.

## 2020-06-01 ENCOUNTER — Telehealth: Payer: Self-pay | Admitting: Internal Medicine

## 2020-06-01 ENCOUNTER — Telehealth: Payer: Self-pay | Admitting: *Deleted

## 2020-06-01 NOTE — Telephone Encounter (Signed)
Scheduled appts per los. Called and spoke with patient. Patient was a bit confused and wanted me to call her caregiver [name]. Attempted to contact caregiver, no answer, keeps going to voicemail. Left msg.

## 2020-06-01 NOTE — Telephone Encounter (Signed)
I called patient and she gave the phone to her caregiver, [name].  I updated [name] on her appts. She was not ok with her appts.  I changed her chemo ed appt and will update scheduling to change these appt.

## 2020-06-02 ENCOUNTER — Other Ambulatory Visit: Payer: Medicare HMO

## 2020-06-03 NOTE — Progress Notes (Signed)
Pharmacist Chemotherapy Monitoring - Initial Assessment    Anticipated start date: 06/07/20   Regimen:  . Are orders appropriate based on the patient's diagnosis, regimen, and cycle? Yes . Does the plan date match the patient's scheduled date? Yes . Is the sequencing of drugs appropriate? Yes . Are the premedications appropriate for the patient's regimen? Yes . Prior Authorization for treatment is: Approved o If applicable, is the correct biosimilar selected based on the patient's insurance? yes  Organ Function and Labs: Marland Kitchen Are dose adjustments needed based on the patient's renal function, hepatic function, or hematologic function? No . Are appropriate labs ordered prior to the start of patient's treatment? Yes . Other organ system assessment, if indicated: N/A . The following baseline labs, if indicated, have been ordered: N/A  Dose Assessment: . Are the drug doses appropriate? Yes . Are the following correct: o Drug concentrations Yes o IV fluid compatible with drug Yes o Administration routes Yes o Timing of therapy Yes . If applicable, does the patient have documented access for treatment and/or plans for port-a-cath placement? no . If applicable, have lifetime cumulative doses been properly documented and assessed? yes Lifetime Dose Tracking  No doses have been documented on this patient for the following tracked chemicals: Doxorubicin, Epirubicin, Idarubicin, Daunorubicin, Mitoxantrone, Bleomycin, Oxaliplatin, Carboplatin, Liposomal Doxorubicin  o   Toxicity Monitoring/Prevention: . The patient has the following take home antiemetics prescribed: N/A . The patient has the following take home medications prescribed: N/A . Medication allergies and previous infusion related reactions, if applicable, have been reviewed and addressed. Yes . The patient's current medication list has been assessed for drug-drug interactions with their chemotherapy regimen. no significant drug-drug  interactions were identified on review.  Order Review: . Are the treatment plan orders signed? Yes . Is the patient scheduled to see a provider prior to their treatment? No  I verify that I have reviewed each item in the above checklist and answered each question accordingly.   Kennith Center, Pharm.D., CPP 06/03/2020@3 :04 PM

## 2020-06-06 ENCOUNTER — Inpatient Hospital Stay: Payer: Medicare HMO

## 2020-06-06 ENCOUNTER — Other Ambulatory Visit: Payer: Self-pay | Admitting: *Deleted

## 2020-06-06 ENCOUNTER — Other Ambulatory Visit: Payer: Self-pay

## 2020-06-07 ENCOUNTER — Other Ambulatory Visit: Payer: Self-pay | Admitting: *Deleted

## 2020-06-07 ENCOUNTER — Encounter: Payer: Self-pay | Admitting: Internal Medicine

## 2020-06-07 ENCOUNTER — Other Ambulatory Visit: Payer: Self-pay | Admitting: Physician Assistant

## 2020-06-07 ENCOUNTER — Other Ambulatory Visit: Payer: Medicare HMO

## 2020-06-07 ENCOUNTER — Inpatient Hospital Stay: Payer: Medicare HMO

## 2020-06-07 ENCOUNTER — Other Ambulatory Visit: Payer: Self-pay

## 2020-06-07 VITALS — BP 95/58 | HR 67 | Temp 98.7°F | Resp 17

## 2020-06-07 DIAGNOSIS — C342 Malignant neoplasm of middle lobe, bronchus or lung: Secondary | ICD-10-CM

## 2020-06-07 DIAGNOSIS — Z5111 Encounter for antineoplastic chemotherapy: Secondary | ICD-10-CM | POA: Diagnosis not present

## 2020-06-07 LAB — CMP (CANCER CENTER ONLY)
ALT: 8 U/L (ref 0–44)
AST: 11 U/L — ABNORMAL LOW (ref 15–41)
Albumin: 3 g/dL — ABNORMAL LOW (ref 3.5–5.0)
Alkaline Phosphatase: 80 U/L (ref 38–126)
Anion gap: 11 (ref 5–15)
BUN: 28 mg/dL — ABNORMAL HIGH (ref 8–23)
CO2: 19 mmol/L — ABNORMAL LOW (ref 22–32)
Calcium: 10.4 mg/dL — ABNORMAL HIGH (ref 8.9–10.3)
Chloride: 107 mmol/L (ref 98–111)
Creatinine: 1.5 mg/dL — ABNORMAL HIGH (ref 0.44–1.00)
GFR, Est AFR Am: 41 mL/min — ABNORMAL LOW (ref >60)
GFR, Estimated: 35 mL/min — ABNORMAL LOW (ref >60)
Glucose, Bld: 88 mg/dL (ref 70–99)
Potassium: 4.9 mmol/L (ref 3.5–5.1)
Sodium: 137 mmol/L (ref 135–145)
Total Bilirubin: <0.2 mg/dL — ABNORMAL LOW (ref 0.3–1.2)
Total Protein: 7 g/dL (ref 6.5–8.1)

## 2020-06-07 LAB — CBC WITH DIFFERENTIAL (CANCER CENTER ONLY)
Abs Immature Granulocytes: 0.02 10*3/uL (ref 0.00–0.07)
Basophils Absolute: 0 10*3/uL (ref 0.0–0.1)
Basophils Relative: 0 %
Eosinophils Absolute: 0.2 10*3/uL (ref 0.0–0.5)
Eosinophils Relative: 2 %
HCT: 28.1 % — ABNORMAL LOW (ref 36.0–46.0)
Hemoglobin: 9 g/dL — ABNORMAL LOW (ref 12.0–15.0)
Immature Granulocytes: 0 %
Lymphocytes Relative: 24 %
Lymphs Abs: 2 10*3/uL (ref 0.7–4.0)
MCH: 29.5 pg (ref 26.0–34.0)
MCHC: 32 g/dL (ref 30.0–36.0)
MCV: 92.1 fL (ref 80.0–100.0)
Monocytes Absolute: 0.6 10*3/uL (ref 0.1–1.0)
Monocytes Relative: 6 %
Neutro Abs: 5.8 10*3/uL (ref 1.7–7.7)
Neutrophils Relative %: 68 %
Platelet Count: 283 10*3/uL (ref 150–400)
RBC: 3.05 MIL/uL — ABNORMAL LOW (ref 3.87–5.11)
RDW: 13.3 % (ref 11.5–15.5)
WBC Count: 8.6 10*3/uL (ref 4.0–10.5)
nRBC: 0 % (ref 0.0–0.2)

## 2020-06-07 MED ORDER — SODIUM CHLORIDE 0.9 % IV SOLN
75.0000 mg/m2 | Freq: Once | INTRAVENOUS | Status: AC
  Administered 2020-06-07: 130 mg via INTRAVENOUS
  Filled 2020-06-07: qty 6.5

## 2020-06-07 MED ORDER — SODIUM CHLORIDE 0.9 % IV SOLN
10.0000 mg | Freq: Once | INTRAVENOUS | Status: AC
  Administered 2020-06-07: 10 mg via INTRAVENOUS
  Filled 2020-06-07: qty 10
  Filled 2020-06-07: qty 1

## 2020-06-07 MED ORDER — PALONOSETRON HCL INJECTION 0.25 MG/5ML
0.2500 mg | Freq: Once | INTRAVENOUS | Status: AC
  Administered 2020-06-07: 0.25 mg via INTRAVENOUS

## 2020-06-07 MED ORDER — SODIUM CHLORIDE 0.9 % IV SOLN
312.0000 mg | Freq: Once | INTRAVENOUS | Status: AC
  Administered 2020-06-07: 310 mg via INTRAVENOUS
  Filled 2020-06-07: qty 31

## 2020-06-07 MED ORDER — PALONOSETRON HCL INJECTION 0.25 MG/5ML
INTRAVENOUS | Status: AC
  Filled 2020-06-07: qty 5

## 2020-06-07 MED ORDER — PROCHLORPERAZINE MALEATE 10 MG PO TABS: 10.0000 mg | ORAL_TABLET | Freq: Four times a day (QID) | ORAL | 2 refills | Status: DC | PRN

## 2020-06-07 MED ORDER — SODIUM CHLORIDE 0.9 % IV SOLN
150.0000 mg | Freq: Once | INTRAVENOUS | Status: AC
  Administered 2020-06-07: 150 mg via INTRAVENOUS
  Filled 2020-06-07: qty 5
  Filled 2020-06-07: qty 150

## 2020-06-07 MED ORDER — SODIUM CHLORIDE 0.9 % IV SOLN
Freq: Once | INTRAVENOUS | Status: AC
  Filled 2020-06-07: qty 250

## 2020-06-07 NOTE — Patient Instructions (Signed)
[facility] Discharge Instructions for Patients Receiving Chemotherapy  Today you received the following chemotherapy agents carboplatin/etoposide  To help prevent nausea and vomiting after your treatment, we encourage you to take your nausea medication as directed   If you develop nausea and vomiting that is not controlled by your nausea medication, call the clinic.   BELOW ARE SYMPTOMS THAT SHOULD BE REPORTED IMMEDIATELY:  *FEVER GREATER THAN 100.5 F  *CHILLS WITH OR WITHOUT FEVER  NAUSEA AND VOMITING THAT IS NOT CONTROLLED WITH YOUR NAUSEA MEDICATION  *UNUSUAL SHORTNESS OF BREATH  *UNUSUAL BRUISING OR BLEEDING  TENDERNESS IN MOUTH AND THROAT WITH OR WITHOUT PRESENCE OF ULCERS  *URINARY PROBLEMS  *BOWEL PROBLEMS  UNUSUAL RASH Items with * indicate a potential emergency and should be followed up as soon as possible.  Feel free to call the clinic you have any questions or concerns. The clinic phone number is [phone]  [phone].  Carboplatin injection What is this medicine? CARBOPLATIN (KAR boe pla tin) is a chemotherapy drug. It targets fast dividing cells, like cancer cells, and causes these cells to die. This medicine is used to treat ovarian cancer and many other cancers. This medicine may be used for other purposes; ask your health care provider or pharmacist if you have questions. COMMON BRAND NAME(S): Paraplatin What should I tell my health care provider before I take this medicine? They need to know if you have any of these conditions:  blood disorders  hearing problems  kidney disease  recent or ongoing radiation therapy  an unusual or allergic reaction to carboplatin, cisplatin, other chemotherapy, other medicines, foods, dyes, or preservatives  pregnant or trying to get pregnant  breast-feeding How should I use this medicine? This drug is usually given as an infusion into a vein. It is administered in a hospital or clinic by a specially  trained health care professional. Talk to your pediatrician regarding the use of this medicine in children. Special care may be needed. Overdosage: If you think you have taken too much of this medicine contact a poison control center or emergency room at once. NOTE: This medicine is only for you. Do not share this medicine with others. What if I miss a dose? It is important not to miss a dose. Call your doctor or health care professional if you are unable to keep an appointment. What may interact with this medicine?  medicines for seizures  medicines to increase blood counts like filgrastim, pegfilgrastim, sargramostim  some antibiotics like amikacin, gentamicin, neomycin, streptomycin, tobramycin  vaccines Talk to your doctor or health care professional before taking any of these medicines:  acetaminophen  aspirin  ibuprofen  ketoprofen  naproxen This list may not describe all possible interactions. Give your health care provider a list of all the medicines, herbs, non-prescription drugs, or dietary supplements you use. Also tell them if you smoke, drink alcohol, or use illegal drugs. Some items may interact with your medicine. What should I watch for while using this medicine? Your condition will be monitored carefully while you are receiving this medicine. You will need important blood work done while you are taking this medicine. This drug may make you feel generally unwell. This is not uncommon, as chemotherapy can affect healthy cells as well as cancer cells. Report any side effects. Continue your course of treatment even though you feel ill unless your doctor tells you to stop. In some cases, you may be given additional medicines to help with side effects. Follow all  directions for their use. Call your doctor or health care professional for advice if you get a fever, chills or sore throat, or other symptoms of a cold or flu. Do not treat yourself. This drug decreases your body's  ability to fight infections. Try to avoid being around people who are sick. This medicine may increase your risk to bruise or bleed. Call your doctor or health care professional if you notice any unusual bleeding. Be careful brushing and flossing your teeth or using a toothpick because you may get an infection or bleed more easily. If you have any dental work done, tell your dentist you are receiving this medicine. Avoid taking products that contain aspirin, acetaminophen, ibuprofen, naproxen, or ketoprofen unless instructed by your doctor. These medicines may hide a fever. Do not become pregnant while taking this medicine. Women should inform their doctor if they wish to become pregnant or think they might be pregnant. There is a potential for serious side effects to an unborn child. Talk to your health care professional or pharmacist for more information. Do not breast-feed an infant while taking this medicine. What side effects may I notice from receiving this medicine? Side effects that you should report to your doctor or health care professional as soon as possible:  allergic reactions like skin rash, itching or hives, swelling of the face, lips, or tongue  signs of infection - fever or chills, cough, sore throat, pain or difficulty passing urine  signs of decreased platelets or bleeding - bruising, pinpoint red spots on the skin, black, tarry stools, nosebleeds  signs of decreased red blood cells - unusually weak or tired, fainting spells, lightheadedness  breathing problems  changes in hearing  changes in vision  chest pain  high blood pressure  low blood counts - This drug may decrease the number of white blood cells, red blood cells and platelets. You may be at increased risk for infections and bleeding.  nausea and vomiting  pain, swelling, redness or irritation at the injection site  pain, tingling, numbness in the hands or feet  problems with balance, talking,  walking  trouble passing urine or change in the amount of urine Side effects that usually do not require medical attention (report to your doctor or health care professional if they continue or are bothersome):  hair loss  loss of appetite  metallic taste in the mouth or changes in taste This list may not describe all possible side effects. Call your doctor for medical advice about side effects. You may report side effects to FDA at 1-800-FDA-1088. Where should I keep my medicine? This drug is given in a hospital or clinic and will not be stored at home. NOTE: This sheet is a summary. It may not cover all possible information. If you have questions about this medicine, talk to your doctor, pharmacist, or health care provider.  2020 Elsevier/Gold Standard (2008-01-06 14:38:05)  Etoposide, VP-16 injection What is this medicine? ETOPOSIDE, VP-16 (e toe POE side) is a chemotherapy drug. It is used to treat testicular cancer, lung cancer, and other cancers. This medicine may be used for other purposes; ask your health care provider or pharmacist if you have questions. COMMON BRAND NAME(S): Etopophos, Toposar, VePesid What should I tell my health care provider before I take this medicine? They need to know if you have any of these conditions:  infection  kidney disease  liver disease  low blood counts, like low white cell, platelet, or red cell counts  an unusual  or allergic reaction to etoposide, other medicines, foods, dyes, or preservatives  pregnant or trying to get pregnant  breast-feeding How should I use this medicine? This medicine is for infusion into a vein. It is administered in a hospital or clinic by a specially trained health care professional. Talk to your pediatrician regarding the use of this medicine in children. Special care may be needed. Overdosage: If you think you have taken too much of this medicine contact a poison control center or emergency room at  once. NOTE: This medicine is only for you. Do not share this medicine with others. What if I miss a dose? It is important not to miss your dose. Call your doctor or health care professional if you are unable to keep an appointment. What may interact with this medicine? This medicine may interact with the following medications:  warfarin This list may not describe all possible interactions. Give your health care provider a list of all the medicines, herbs, non-prescription drugs, or dietary supplements you use. Also tell them if you smoke, drink alcohol, or use illegal drugs. Some items may interact with your medicine. What should I watch for while using this medicine? Visit your doctor for checks on your progress. This drug may make you feel generally unwell. This is not uncommon, as chemotherapy can affect healthy cells as well as cancer cells. Report any side effects. Continue your course of treatment even though you feel ill unless your doctor tells you to stop. In some cases, you may be given additional medicines to help with side effects. Follow all directions for their use. Call your doctor or health care professional for advice if you get a fever, chills or sore throat, or other symptoms of a cold or flu. Do not treat yourself. This drug decreases your body's ability to fight infections. Try to avoid being around people who are sick. This medicine may increase your risk to bruise or bleed. Call your doctor or health care professional if you notice any unusual bleeding. Talk to your doctor about your risk of cancer. You may be more at risk for certain types of cancers if you take this medicine. Do not become pregnant while taking this medicine or for at least 6 months after stopping it. Women should inform their doctor if they wish to become pregnant or think they might be pregnant. Women of child-bearing potential will need to have a negative pregnancy test before starting this medicine. There  is a potential for serious side effects to an unborn child. Talk to your health care professional or pharmacist for more information. Do not breast-feed an infant while taking this medicine. Men must use a latex condom during sexual contact with a woman while taking this medicine and for at least 4 months after stopping it. A latex condom is needed even if you have had a vasectomy. Contact your doctor right away if your partner becomes pregnant. Do not donate sperm while taking this medicine and for at least 4 months after you stop taking this medicine. Men should inform their doctors if they wish to father a child. This medicine may lower sperm counts. What side effects may I notice from receiving this medicine? Side effects that you should report to your doctor or health care professional as soon as possible:  allergic reactions like skin rash, itching or hives, swelling of the face, lips, or tongue  low blood counts - this medicine may decrease the number of white blood cells, red blood cells,  and platelets. You may be at increased risk for infections and bleeding  nausea, vomiting  redness, blistering, peeling or loosening of the skin, including inside the mouth  signs and symptoms of infection like fever; chills; cough; sore throat; pain or trouble passing urine  signs and symptoms of low red blood cells or anemia such as unusually weak or tired; feeling faint or lightheaded; falls; breathing problems  unusual bruising or bleeding Side effects that usually do not require medical attention (report to your doctor or health care professional if they continue or are bothersome):  changes in taste  diarrhea  hair loss  loss of appetite  mouth sores This list may not describe all possible side effects. Call your doctor for medical advice about side effects. You may report side effects to FDA at 1-800-FDA-1088. Where should I keep my medicine? This drug is given in a hospital or clinic  and will not be stored at home. NOTE: This sheet is a summary. It may not cover all possible information. If you have questions about this medicine, talk to your doctor, pharmacist, or health care provider.  2020 Elsevier/Gold Standard (2018-11-26 16:57:15)

## 2020-06-07 NOTE — Progress Notes (Signed)
Met w/ pt to introduce myself as her Arboriculturist, discuss copay assistance and the [employer].  Pt gave me consent to apply in her behalf so I completed the online application w/ the [employer] and she was approved for $9,400 for Carboplatin and Etoposide effective5/26/21 to 06/06/21.  I also informed her of the [employer], went over what it covers and gave her an expense sheet.  She would like to apply so she will bring proof of income on 06/08/20.  I will also give her a [place] [place] application to apply for additional assistance w/ them.  She has my card for any questions or concerns she may have in the future.

## 2020-06-07 NOTE — Progress Notes (Signed)
Patients CrCl 37 ml/min with scr 1.5  Received order from Dr [name] to decrease dose by 25% = 73m/m2 per dosing recommendations.  Plan updated to reflect above.  T.O. Dr [name]/Natthew Marlatt JRonnald Ramp PharmD

## 2020-06-08 ENCOUNTER — Ambulatory Visit (HOSPITAL_COMMUNITY)
Admission: RE | Admit: 2020-06-08 | Discharge: 2020-06-08 | Disposition: A | Discharge status: Home / Self Care | Payer: Medicare HMO | Source: Ambulatory Visit | Attending: Internal Medicine | Admitting: Internal Medicine

## 2020-06-08 ENCOUNTER — Other Ambulatory Visit: Payer: Self-pay

## 2020-06-08 ENCOUNTER — Inpatient Hospital Stay: Payer: Medicare HMO

## 2020-06-08 ENCOUNTER — Ambulatory Visit (HOSPITAL_COMMUNITY)
Admission: RE | Admit: 2020-06-08 | Discharge: 2020-06-08 | Disposition: A | Discharge status: Home / Self Care | Payer: Medicare HMO | Source: Ambulatory Visit | Attending: Gastroenterology | Admitting: Gastroenterology

## 2020-06-08 VITALS — BP 106/80 | HR 63 | Temp 98.1°F | Resp 18

## 2020-06-08 DIAGNOSIS — R160 Hepatomegaly, not elsewhere classified: Secondary | ICD-10-CM | POA: Insufficient documentation

## 2020-06-08 DIAGNOSIS — K7460 Unspecified cirrhosis of liver: Secondary | ICD-10-CM | POA: Diagnosis present

## 2020-06-08 DIAGNOSIS — C3490 Malignant neoplasm of unspecified part of unspecified bronchus or lung: Secondary | ICD-10-CM

## 2020-06-08 DIAGNOSIS — D509 Iron deficiency anemia, unspecified: Secondary | ICD-10-CM | POA: Insufficient documentation

## 2020-06-08 DIAGNOSIS — Z5111 Encounter for antineoplastic chemotherapy: Secondary | ICD-10-CM | POA: Diagnosis not present

## 2020-06-08 DIAGNOSIS — R197 Diarrhea, unspecified: Secondary | ICD-10-CM | POA: Diagnosis present

## 2020-06-08 DIAGNOSIS — C342 Malignant neoplasm of middle lobe, bronchus or lung: Secondary | ICD-10-CM

## 2020-06-08 MED ORDER — SODIUM CHLORIDE 0.9 % IV SOLN
75.0000 mg/m2 | Freq: Once | INTRAVENOUS | Status: AC
  Administered 2020-06-08: 130 mg via INTRAVENOUS
  Filled 2020-06-08: qty 6.5

## 2020-06-08 MED ORDER — SODIUM CHLORIDE 0.9 % IV SOLN
Freq: Once | INTRAVENOUS | Status: AC
  Filled 2020-06-08: qty 250

## 2020-06-08 MED ORDER — GADOBUTROL 1 MMOL/ML IV SOLN
7.0000 mL | Freq: Once | INTRAVENOUS | Status: AC | PRN
  Administered 2020-06-08: 7 mL via INTRAVENOUS

## 2020-06-08 MED ORDER — SODIUM CHLORIDE 0.9 % IV SOLN
10.0000 mg | Freq: Once | INTRAVENOUS | Status: AC
  Administered 2020-06-08: 10 mg via INTRAVENOUS
  Filled 2020-06-08: qty 10

## 2020-06-08 MED ORDER — IOHEXOL 350 MG/ML SOLN
80.0000 mL | Freq: Once | INTRAVENOUS | Status: AC | PRN
  Administered 2020-06-08: 80 mL via INTRAVENOUS

## 2020-06-08 NOTE — Patient Instructions (Signed)
[facility] Discharge Instructions for Patients Receiving Chemotherapy  Today you received the following chemotherapy agents Etoposide.  To help prevent nausea and vomiting after your treatment, we encourage you to take your nausea medication    If you develop nausea and vomiting that is not controlled by your nausea medication, call the clinic.   BELOW ARE SYMPTOMS THAT SHOULD BE REPORTED IMMEDIATELY:  *FEVER GREATER THAN 100.5 F  *CHILLS WITH OR WITHOUT FEVER  NAUSEA AND VOMITING THAT IS NOT CONTROLLED WITH YOUR NAUSEA MEDICATION  *UNUSUAL SHORTNESS OF BREATH  *UNUSUAL BRUISING OR BLEEDING  TENDERNESS IN MOUTH AND THROAT WITH OR WITHOUT PRESENCE OF ULCERS  *URINARY PROBLEMS  *BOWEL PROBLEMS  UNUSUAL RASH Items with * indicate a potential emergency and should be followed up as soon as possible.  Feel free to call the clinic should you have any questions or concerns. The clinic phone number is [phone].  Please show the [place] at check-in to the Emergency Department and triage nurse.

## 2020-06-09 ENCOUNTER — Inpatient Hospital Stay: Payer: Medicare HMO

## 2020-06-09 ENCOUNTER — Other Ambulatory Visit: Payer: Self-pay

## 2020-06-09 DIAGNOSIS — C342 Malignant neoplasm of middle lobe, bronchus or lung: Secondary | ICD-10-CM

## 2020-06-09 DIAGNOSIS — Z5111 Encounter for antineoplastic chemotherapy: Secondary | ICD-10-CM | POA: Diagnosis not present

## 2020-06-09 MED ORDER — SODIUM CHLORIDE 0.9 % IV SOLN
10.0000 mg | Freq: Once | INTRAVENOUS | Status: AC
  Administered 2020-06-09: 10 mg via INTRAVENOUS
  Filled 2020-06-09: qty 10

## 2020-06-09 MED ORDER — SODIUM CHLORIDE 0.9 % IV SOLN
Freq: Once | INTRAVENOUS | Status: AC
  Filled 2020-06-09: qty 250

## 2020-06-09 MED ORDER — SODIUM CHLORIDE 0.9 % IV SOLN
75.0000 mg/m2 | Freq: Once | INTRAVENOUS | Status: AC
  Administered 2020-06-09: 130 mg via INTRAVENOUS
  Filled 2020-06-09: qty 6.5

## 2020-06-09 NOTE — Progress Notes (Signed)
Patient went in to use bathroom at 1533. Patient opened bathroom door, stated she needed help.  Her IV had been completely pulled out and patient was bleeding from arm onto floor.  IV pump was stopped and pressure applied to IV site on arm.  Patient had approximately 50 cc's left of infusion. Cassie Heilingoepter, PA made aware. Patient's shoes and clothing cleaned of visible blood. Patient advised to clean cloths and shoes in washer. Patient verbalized understanding.

## 2020-06-09 NOTE — Patient Instructions (Signed)
[facility] Discharge Instructions for Patients Receiving Chemotherapy  Today you received the following chemotherapy agents Etoposide.  To help prevent nausea and vomiting after your treatment, we encourage you to take your nausea medication    If you develop nausea and vomiting that is not controlled by your nausea medication, call the clinic.   BELOW ARE SYMPTOMS THAT SHOULD BE REPORTED IMMEDIATELY:  *FEVER GREATER THAN 100.5 F  *CHILLS WITH OR WITHOUT FEVER  NAUSEA AND VOMITING THAT IS NOT CONTROLLED WITH YOUR NAUSEA MEDICATION  *UNUSUAL SHORTNESS OF BREATH  *UNUSUAL BRUISING OR BLEEDING  TENDERNESS IN MOUTH AND THROAT WITH OR WITHOUT PRESENCE OF ULCERS  *URINARY PROBLEMS  *BOWEL PROBLEMS  UNUSUAL RASH Items with * indicate a potential emergency and should be followed up as soon as possible.  Feel free to call the clinic should you have any questions or concerns. The clinic phone number is [phone].  Please show the [place] at check-in to the Emergency Department and triage nurse.

## 2020-06-09 NOTE — Progress Notes (Signed)
Infusion nurse Marita [place] called to advise that during visit to bathroom IV was pulled out while Etoposide infusion was going.  Remaining volume in the bag is approximately 50 cc's per nurse.    Per Cassandra Heilingoetter, PA, ok to leave IV out and end infusion without giving last approximate amount of 50 cc's.  Communicated to Infusion nurse.

## 2020-06-10 ENCOUNTER — Telehealth: Payer: Self-pay | Admitting: *Deleted

## 2020-06-11 ENCOUNTER — Other Ambulatory Visit: Payer: Self-pay

## 2020-06-11 ENCOUNTER — Inpatient Hospital Stay: Payer: Medicare HMO

## 2020-06-11 VITALS — BP 97/65 | HR 68 | Temp 98.3°F | Resp 18

## 2020-06-11 DIAGNOSIS — C342 Malignant neoplasm of middle lobe, bronchus or lung: Secondary | ICD-10-CM

## 2020-06-11 DIAGNOSIS — Z5111 Encounter for antineoplastic chemotherapy: Secondary | ICD-10-CM | POA: Diagnosis not present

## 2020-06-11 MED ORDER — PEGFILGRASTIM-JMDB 6 MG/0.6ML ~~LOC~~ SOSY
6.0000 mg | PREFILLED_SYRINGE | Freq: Once | SUBCUTANEOUS | Status: AC
  Administered 2020-06-11: 6 mg via SUBCUTANEOUS

## 2020-06-11 NOTE — Patient Instructions (Signed)

## 2020-06-13 ENCOUNTER — Other Ambulatory Visit: Payer: Self-pay

## 2020-06-13 ENCOUNTER — Encounter: Payer: Self-pay | Admitting: Internal Medicine

## 2020-06-13 ENCOUNTER — Inpatient Hospital Stay: Payer: Medicare HMO

## 2020-06-13 ENCOUNTER — Inpatient Hospital Stay (HOSPITAL_BASED_OUTPATIENT_CLINIC_OR_DEPARTMENT_OTHER): Payer: Medicare HMO | Admitting: Internal Medicine

## 2020-06-13 VITALS — BP 103/69 | HR 73 | Temp 98.3°F | Resp 17 | Ht 66.0 in | Wt 146.1 lb

## 2020-06-13 DIAGNOSIS — Z5111 Encounter for antineoplastic chemotherapy: Secondary | ICD-10-CM | POA: Diagnosis not present

## 2020-06-13 DIAGNOSIS — C342 Malignant neoplasm of middle lobe, bronchus or lung: Secondary | ICD-10-CM | POA: Diagnosis not present

## 2020-06-13 LAB — CMP (CANCER CENTER ONLY)
ALT: 9 U/L (ref 0–44)
AST: 9 U/L — ABNORMAL LOW (ref 15–41)
Albumin: 3.1 g/dL — ABNORMAL LOW (ref 3.5–5.0)
Alkaline Phosphatase: 78 U/L (ref 38–126)
Anion gap: 10 (ref 5–15)
BUN: 31 mg/dL — ABNORMAL HIGH (ref 8–23)
CO2: 22 mmol/L (ref 22–32)
Calcium: 9.9 mg/dL (ref 8.9–10.3)
Chloride: 104 mmol/L (ref 98–111)
Creatinine: 1.37 mg/dL — ABNORMAL HIGH (ref 0.44–1.00)
GFR, Est AFR Am: 46 mL/min — ABNORMAL LOW (ref >60)
GFR, Estimated: 40 mL/min — ABNORMAL LOW (ref >60)
Glucose, Bld: 148 mg/dL — ABNORMAL HIGH (ref 70–99)
Potassium: 4.6 mmol/L (ref 3.5–5.1)
Sodium: 136 mmol/L (ref 135–145)
Total Bilirubin: <0.2 mg/dL — ABNORMAL LOW (ref 0.3–1.2)
Total Protein: 6.4 g/dL — ABNORMAL LOW (ref 6.5–8.1)

## 2020-06-13 LAB — CBC WITH DIFFERENTIAL (CANCER CENTER ONLY)
Abs Immature Granulocytes: 0 10*3/uL (ref 0.00–0.07)
Basophils Absolute: 0 10*3/uL (ref 0.0–0.1)
Basophils Relative: 0 %
Eosinophils Absolute: 0.2 10*3/uL (ref 0.0–0.5)
Eosinophils Relative: 1 %
HCT: 26.8 % — ABNORMAL LOW (ref 36.0–46.0)
Hemoglobin: 8.5 g/dL — ABNORMAL LOW (ref 12.0–15.0)
Lymphocytes Relative: 17 %
Lymphs Abs: 2.7 10*3/uL (ref 0.7–4.0)
MCH: 29.3 pg (ref 26.0–34.0)
MCHC: 31.7 g/dL (ref 30.0–36.0)
MCV: 92.4 fL (ref 80.0–100.0)
Monocytes Absolute: 0 10*3/uL — ABNORMAL LOW (ref 0.1–1.0)
Monocytes Relative: 0 %
Neutro Abs: 13.2 10*3/uL — ABNORMAL HIGH (ref 1.7–7.7)
Neutrophils Relative %: 82 %
Platelet Count: 196 10*3/uL (ref 150–400)
RBC: 2.9 MIL/uL — ABNORMAL LOW (ref 3.87–5.11)
RDW: 13.5 % (ref 11.5–15.5)
WBC Count: 16.1 10*3/uL — ABNORMAL HIGH (ref 4.0–10.5)
nRBC: 0 % (ref 0.0–0.2)

## 2020-06-13 NOTE — Progress Notes (Signed)
[facility] [phone]   [phone]  OFFICE PROGRESS NOTE  [name], MD [address] [place] [address]  DIAGNOSIS: Extensive stage (T3, N2, M1c) small cell lung cancer presented with right middle lobe lung mass in addition to right hilar and low right paratracheal adenopathy in addition to tiny solitary brain metastasis diagnosed in August 2021.  PRIOR THERAPY:None  CURRENT THERAPY: Palliative systemic chemotherapy with carboplatin for AUC of 5 on day 1, etoposide 100 mg/M2 on days 1, 2 and 3 with Neulasta support.    INTERVAL HISTORY: BERNARDA ERCK 69 y.o. female returns to the clinic today for follow-up visit accompanied by her friend [name].  The patient is feeling fine today with no concerning complaints except for mild fatigue.  The patient tolerated the first week of her treatment fairly well except for the fatigue after the Neulasta injection.  She denied having any nausea, vomiting, diarrhea or constipation.  She denied having any headache or visual changes.  She has no weight loss or night sweats.  She denied having any chest pain, shortness of breath, cough or hemoptysis.  She is here today for evaluation and repeat blood work.  She had MRI of the brain performed recently that showed 4 mm solitary metastasis in the midline cerebellum with mild vasogenic edema.  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   Anemia    Anxiety    ASCVD (arteriosclerotic cardiovascular disease)    MI in 96 requiring BMS CX; DES to M1 in 2000;normal coronary angiography in 2004   Cholelithiasis    COPD (chronic obstructive pulmonary disease) (HCC)    DDD (degenerative disc disease), lumbar    Depression    Diabetes mellitus    Type II   Diarrhea    DVT (deep venous thrombosis) ([facility])    patient said no   GERD (gastroesophageal reflux disease)     Headache    migraine   History of kidney stones    2006   Hyperlipidemia    Hypotension    Hypothyroidism    Low back pain    Myocardial infarction [facility]) 01/1995   Nephrolithiasis 2006   stone extraction    Panic attacks    Peripheral neuropathy    Peripheral vascular disease (HCC)    legs   Pneumonia 12/22/2019   Sciatic pain    right   Tobacco abuse    Tremor     ALLERGIES:  is allergic to iohexol, tape, ciprofloxacin, latex, penicillins, and povidone-iodine.  MEDICATIONS:  Current Outpatient Medications  Medication Sig Dispense Refill   albuterol (PROVENTIL HFA;VENTOLIN HFA) 108 (90 BASE) MCG/ACT inhaler Inhale 2 puffs into the lungs every 4 (four) hours as needed for wheezing or shortness of breath. 1 Inhaler 0   aspirin-acetaminophen-caffeine (EXCEDRIN MIGRAINE) 250-250-65 MG tablet Take 2 tablets by mouth daily as needed for headache. Up to 4 times a week     azelastine (OPTIVAR) 0.05 % ophthalmic solution Place 1 drop into both eyes 2 (two) times daily.     b complex vitamins tablet Take 1 tablet by mouth daily. With Zinc and vitamin C     Benzocaine (BOIL-EASE EX) Apply 1 application topically daily as needed (Boil).     Biotin 5000 MCG TABS Take 10,000 mcg by mouth daily. Keratin     Cholecalciferol (VITAMIN D3) 50  MCG (2000 UT) TABS Take 4,000 Units by mouth daily.     colestipol (COLESTID) 5 g packet Take 3 g by mouth daily. Do not take within 2 hours of other medications. 18 packet 0   Cyanocobalamin 2500 MCG CHEW Chew 2,500 mcg by mouth daily.     diphenhydrAMINE (BENADRYL) 50 MG capsule Take 39m 1 hour before scheduled CT 1 capsule 0   ferrous sulfate 325 (65 FE) MG tablet Take 325 mg by mouth daily with breakfast.     Fluticasone-Umeclidin-Vilant (TRELEGY ELLIPTA) 100-62.5-25 MCG/INH AEPB Inhale 1 puff into the lungs daily. 28 each 5   Fluticasone-Umeclidin-Vilant (TRELEGY ELLIPTA) 100-62.5-25 MCG/INH AEPB Inhale 1 puff into the  lungs daily. 60 each 0   furosemide (LASIX) 40 MG tablet Take 1 tablet (40 mg total) by mouth 2 (two) times daily. (Patient taking differently: Take 40 mg by mouth daily. ) 30 tablet 1   Ginkgo Biloba Extract (GNP GINGKO BILOBA EXTRACT) 60 MG CAPS Take 1 capsule by mouth daily.     hydrocortisone cream 1 % Apply 1 application topically daily as needed for itching.     ibuprofen (ADVIL) 200 MG tablet Take 200-600 mg by mouth every 6 (six) hours as needed for moderate pain.     LANTUS SOLOSTAR 100 UNIT/ML Solostar Pen Inject 30 Units into the skin daily.      levothyroxine (SYNTHROID) 100 MCG tablet Take 100 mcg by mouth daily before breakfast.     lisinopril (ZESTRIL) 20 MG tablet Take 20 mg by mouth at bedtime.     LORazepam (ATIVAN) 0.5 MG tablet Take 1 tablet 30 minutes before the MRI.  Repeat once if needed. 2 tablet 0   Menthol, Topical Analgesic, (ICY HOT) 7.5 % (Roll) MISC Apply 1 each topically daily as needed (Pain). spray      metFORMIN (GLUCOPHAGE) 500 MG tablet Take 500 mg by mouth 2 (two) times daily with a meal.      Multiple Vitamins-Minerals (MULTIVITAMIN WITH MINERALS) tablet Take 1 tablet by mouth daily. Adult 50+     neomycin-bacitracin-polymyxin (NEOSPORIN) OINT Apply 1 application topically daily as needed for irritation or wound care.     Omega 3-6-9 Fatty Acids (OMEGA 3-6-9 COMPLEX PO) Take 2 tablets by mouth daily.     oxyCODONE-acetaminophen (PERCOCET/ROXICET) 5-325 MG tablet Take 0.5 tablets by mouth 2 (two) times daily as needed for severe pain.     pantoprazole (PROTONIX) 40 MG tablet Take 40 mg by mouth daily.     PARoxetine (PAXIL) 20 MG tablet Take 20 mg by mouth daily.      predniSONE (DELTASONE) 50 MG tablet Take 50 mg 13 hours, 7 hours, and 1 hour before scheduled CT scan 3 tablet 0   prochlorperazine (COMPAZINE) 10 MG tablet Take 1 tablet (10 mg total) by mouth every 6 (six) hours as needed. 30 tablet 2   rosuvastatin (CRESTOR) 10 MG tablet Take  1 tablet (10 mg total) by mouth daily. 90 tablet 3   traZODone (DESYREL) 150 MG tablet Take 150 mg by mouth at bedtime.      No current facility-administered medications for this visit.    SURGICAL HISTORY:  Past Surgical History:  Procedure Laterality Date   AGILE CAPSULE N/A 03/07/2020   Procedure: AGILE CAPSULE;  Surgeon: RDaneil Dolin MD;  [facility];  Service: Endoscopy;  Laterality: N/A;  7:30am   BACK SURGERY     fusion   BRONCHIAL NEEDLE ASPIRATION BIOPSY N/A 05/20/2020  Procedure: BRONCHIAL NEEDLE ASPIRATION BIOPSIES;  Surgeon: Collene Gobble, MD;  Location: [facility] ENDOSCOPY;  Service: Pulmonary;  Laterality: N/A;   CHOLECYSTECTOMY     COLONOSCOPY  01/2009   UDJ:SHFWYO rectum/repeat in 5 yrs   COLONOSCOPY N/A 04/29/2014   Dr.Rourk- attempted/incomplete colonoscopy. inadequate prep   COLONOSCOPY N/A 05/27/2014   VZC:HYIFOYDXA coli. Colonic polyps-removed as described above.Status post segmental biopsy. single tubular adenoma and random colon bx neg   COLONOSCOPY WITH PROPOFOL N/A 12/23/2019   Procedure: COLONOSCOPY WITH PROPOFOL;  Surgeon: Ronnette Juniper, MD;  Location: [facility];  Service: Gastroenterology;  Laterality: N/A;   DILATION AND CURETTAGE OF UTERUS  1974   ESOPHAGOGASTRODUODENOSCOPY  05/2010   Dr. Tessie Fass, erosion. 85F dilation   ESOPHAGOGASTRODUODENOSCOPY N/A 04/29/2014   Dr.Rourk- normal esophagus s/p passage of maloney dilator. small hiatal hernia- bx= chronic inflammation.   ESOPHAGOGASTRODUODENOSCOPY (EGD) WITH PROPOFOL N/A 12/23/2019   Procedure: ESOPHAGOGASTRODUODENOSCOPY (EGD) WITH PROPOFOL;  Surgeon: Ronnette Juniper, MD;  Location: [facility];  Service: Gastroenterology;  Laterality: N/A;   GIVENS CAPSULE STUDY N/A 03/22/2020   Procedure: GIVENS CAPSULE STUDY;  Surgeon: Daneil Dolin, MD;  [facility];  Service: Endoscopy;  Laterality: N/A;  7:30am   HEMOSTASIS CLIP PLACEMENT  12/23/2019   Procedure: HEMOSTASIS CLIP  PLACEMENT;  Surgeon: Ronnette Juniper, MD;  Location: [facility];  Service: Gastroenterology;;   HOT HEMOSTASIS N/A 12/23/2019   Procedure: HOT HEMOSTASIS (ARGON PLASMA COAGULATION/BICAP);  Surgeon: Ronnette Juniper, MD;  Location: [facility];  Service: Gastroenterology;  Laterality: N/A;   LEFT HEART CATH AND CORONARY ANGIOGRAPHY N/A 12/24/2019   Procedure: LEFT HEART CATH AND CORONARY ANGIOGRAPHY;  Surgeon: Nigel Mormon, MD;  Location: [facility];  Service: Cardiovascular;  Laterality: N/A;   MALONEY DILATION N/A 04/29/2014   Procedure: Venia Minks DILATION;  Surgeon: Daneil Dolin, MD;  [facility];  Service: Endoscopy;  Laterality: N/A;   PARTIAL HYSTERECTOMY  1978   POLYPECTOMY  12/23/2019   Procedure: POLYPECTOMY;  Surgeon: Ronnette Juniper, MD;  Location: [facility];  Service: Gastroenterology;;   SHOULDER SURGERY Left    Left shoulder for RTC;left arm surgery '98/left hand surgery 2001   TOTAL ABDOMINAL HYSTERECTOMY W/ BILATERAL SALPINGOOPHORECTOMY  1287   UMBILICAL HERNIA REPAIR  2008   VIDEO BRONCHOSCOPY WITH ENDOBRONCHIAL ULTRASOUND N/A 05/20/2020   Procedure: VIDEO BRONCHOSCOPY WITH ENDOBRONCHIAL ULTRASOUND;  Surgeon: Collene Gobble, MD;  Location: [facility] ENDOSCOPY;  Service: Pulmonary;  Laterality: N/A;    REVIEW OF SYSTEMS:  Constitutional: positive for fatigue Eyes: negative Ears, nose, mouth, throat, and face: negative Respiratory: negative Cardiovascular: negative Gastrointestinal: negative Genitourinary:negative Integument/breast: negative Hematologic/lymphatic: negative Musculoskeletal:negative Neurological: negative Behavioral/Psych: negative Endocrine: negative Allergic/Immunologic: negative   PHYSICAL EXAMINATION: General appearance: alert, cooperative, fatigued and no distress Head: Normocephalic, without obvious abnormality, atraumatic Neck: no adenopathy, no JVD, supple, symmetrical, trachea midline and thyroid not enlarged, symmetric, no  tenderness/mass/nodules Lymph nodes: Cervical, supraclavicular, and axillary nodes normal. Resp: clear to auscultation bilaterally Back: symmetric, no curvature. ROM normal. No CVA tenderness. Cardio: regular rate and rhythm, S1, S2 normal, no murmur, click, rub or gallop GI: soft, non-tender; bowel sounds normal; no masses,  no organomegaly Extremities: extremities normal, atraumatic, no cyanosis or edema Neurologic: Alert and oriented X 3, normal strength and tone. Normal symmetric reflexes. Normal coordination and gait  ECOG PERFORMANCE STATUS: 1 - Symptomatic but completely ambulatory  Blood pressure 103/69, pulse 73, temperature 98.3 F (36.8 C), temperature source Tympanic, resp. rate 17, height 5' 6"  (1.676  m), weight 146 lb 1.6 oz (66.3 kg), SpO2 95 %.  LABORATORY DATA: Lab Results  Component Value Date   WBC 16.1 (H) 06/13/2020   HGB 8.5 (L) 06/13/2020   HCT 26.8 (L) 06/13/2020   MCV 92.4 06/13/2020   PLT 196 06/13/2020      Chemistry      Component Value Date/Time   NA 137 06/07/2020 1206   NA 138 05/25/2020 0806   K 4.9 06/07/2020 1206   CL 107 06/07/2020 1206   CO2 19 (L) 06/07/2020 1206   BUN 28 (H) 06/07/2020 1206   BUN 26 05/25/2020 0806   CREATININE 1.50 (H) 06/07/2020 1206   CREATININE 0.85 05/03/2012 0942      Component Value Date/Time   CALCIUM 10.4 (H) 06/07/2020 1206   ALKPHOS 80 06/07/2020 1206   AST 11 (L) 06/07/2020 1206   ALT 8 06/07/2020 1206   BILITOT <0.2 (L) 06/07/2020 1206       RADIOGRAPHIC STUDIES: MR Brain W Wo Contrast  Result Date: 06/09/2020 CLINICAL DATA:  Small cell lung cancer staging EXAM: MRI HEAD WITHOUT AND WITH CONTRAST TECHNIQUE: Multiplanar, multiecho pulse sequences of the brain and surrounding structures were obtained without and with intravenous contrast. CONTRAST:  51m GADAVIST GADOBUTROL 1 MMOL/ML IV SOLN COMPARISON:  09/05/2014 brain MRI FINDINGS: Brain: Enhancing nodular lesion in the midline cerebellum measuring 4  mm on coronal postcontrast imaging, with mild adjacent vasogenic edema. Subtle nodular densities marked on axial slices more laterally in the cerebellum are not seen on coronal acquisition and attributed to artifact. No incidental infarct, hemorrhage, hydrocephalus, or collection. Mild chronic small vessel disease in the hemispheric white matter. Vascular: Normal flow voids and vascular enhancement Skull and upper cervical spine: No evidence of bony metastasis Sinuses/Orbits: Negative for mass. IMPRESSION: Solitary 4 mm metastasis in the midline cerebellum with mild vasogenic edema. Electronically Signed   By: JMonte FantasiaM.D.   On: 06/09/2020 07:41   CT Angio Abd/Pel w/ and/or w/o  Result Date: 06/09/2020 CLINICAL DATA:  Mesenteric ischemia, weight loss, abdominal pain. History of small cell lung carcinoma post chemotherapy. EXAM: CTA ABDOMEN AND PELVIS WITH CONTRAST TECHNIQUE: Multidetector CT imaging of the abdomen and pelvis was performed using the standard protocol during bolus administration of intravenous contrast. Multiplanar reconstructed images and MIPs were obtained and reviewed to evaluate the vascular anatomy. CONTRAST:  869mOMNIPAQUE IOHEXOL 350 MG/ML SOLN COMPARISON:  05/02/2020 and previous FINDINGS: VASCULAR Aorta: Moderate calcified atheromatous plaque. No high-grade stenosis. No aneurysm or dissection. 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 Celiac: Patent without evidence of aneurysm, dissection, vasculitis or significant stenosis. SMA: Patent without evidence of aneurysm, dissection, vasculitis or significant stenosis. Renals: Single left, with irregular plaque extending from the ostium a long length of at least 2.1 cm resulting in at least moderate stenosis. Single right renal artery, with calcified ostial plaque along the length of at least 1.5 cm resulting in at least mild stenosis, patent distally. IMA: Short-segment origin occlusion or stenosis,  reconstituted distally by visceral collaterals. Inflow: Scattered calcified nonocclusive plaque. No aneurysm or dissection. Proximal Outflow: Atheromatous, patent Veins: Patent hepatic veins, portal vein, SMV, splenic vein, bilateral renal veins. Unremarkable iliac venous system and IVC. No venous pathology evident. Review of the MIP images confirms the above findings. NON-VASCULAR Lower chest: Right mid lung mass, incompletely visualized. Coronary calcifications. No pleural or pericardial effusion. Hepatobiliary: Nodular liver contour without focal lesion or biliary ductal dilatation. Cholecystectomy clips. Pancreas: Unremarkable. No pancreatic ductal dilatation  or surrounding inflammatory changes. Spleen: Normal in size without focal abnormality. Adrenals/Urinary Tract: Adrenal glands unremarkable. Symmetric renal enhancement. No hydronephrosis. Urinary bladder incompletely distended. Stomach/Bowel: Stomach is nondistended. Small-bowel decompressed. Normal appendix. The colon is nondilated, unremarkable. Lymphatic: No abdominal or pelvic adenopathy. Stable subcentimeter left para-aortic and aortocaval mesenteric lymph nodes since 10/30/2013. Reproductive: Status post hysterectomy. No adnexal masses. Other: Right pelvic phleboliths.  No ascites.  No free air. Musculoskeletal: Postop and degenerative changes in the lower lumbar spine. 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 No fracture or worrisome bone lesion. IMPRESSION: 1. No significant proximal mesenteric arterial occlusive disease to suggest an etiology of mesenteric ischemia. 2. Bilateral renal artery ostial stenoses. 3. Right mid lung mass, incompletely visualized. 4. Coronary and aortic Atherosclerosis (ICD10-I70.0). 5. Cirrhosis. 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 Electronically Signed   By: Lucrezia Europe M.D.   On: 06/09/2020 11:03    ASSESSMENT AND PLAN: This is a very pleasant 69 years old white female recently  diagnosed with extensive stage small cell lung cancer (T3, N2, M1 C) presented with right middle lobe lung mass in addition to right hilar and low right paratracheal lymphadenopathy in addition to tiny solitary brain metastasis diagnosed in August 2021. The patient is currently undergoing systemic chemotherapy with carboplatin for AUC of 5 on day 1, etoposide 100 mg/M2 with Neulasta support. She is status post 1 cycle and she tolerated the first cycle of her treatment fairly well. Her lab today was unremarkable except for leukocytosis secondary to the Neulasta injection. I recommended for the patient to continue her current treatment and she will proceed with cycle #2 in 2 weeks. I also discussed with her the results of the MRI and she is seeing Dr. [name] soon for evaluation and discussion of treatment options for this lesion.  It may respond well to the chemotherapy and we can continue to monitor it closely during this time. For the hyperkalemia, her potassium is much better today.  We will continue to monitor for now. The patient was advised to call immediately if she has any concerning symptoms in the interval. The patient voices understanding of current disease status and treatment options and is in agreement with the current care plan.  All questions were answered. The patient knows to call the clinic with any problems, questions or concerns. We can certainly see the patient much sooner if necessary.  The total time spent in the appointment was 30 minutes.  Disclaimer: This note was dictated with voice recognition software. Similar sounding words can inadvertently be transcribed and may not be corrected upon review.

## 2020-06-14 ENCOUNTER — Telehealth: Payer: Self-pay

## 2020-06-14 NOTE — Telephone Encounter (Signed)
Reviewed labs from 8/30. K 4.6, Cr 1.37. Both numbers are stable. I do not see any need to change current medications.Hope that helps.

## 2020-06-14 NOTE — Telephone Encounter (Signed)
Telephone encounter:  Reason for call: pt's caregiver called and wanted you to comment on pt's potassium level and whether or not lisinopril or lasix needed to be changed per her PCP  Usual provider: [facility]  Last office visit: 05/23/20  Next office visit: 11/23/20   Last hospitalization: 05/20/20  Current Outpatient Medications on File Prior to Visit  Medication Sig Dispense Refill  . albuterol (PROVENTIL HFA;VENTOLIN HFA) 108 (90 BASE) MCG/ACT inhaler Inhale 2 puffs into the lungs every 4 (four) hours as needed for wheezing or shortness of breath. 1 Inhaler 0  . aspirin-acetaminophen-caffeine (EXCEDRIN MIGRAINE) 250-250-65 MG tablet Take 2 tablets by mouth daily as needed for headache. Up to 4 times a week    . azelastine (OPTIVAR) 0.05 % ophthalmic solution Place 1 drop into both eyes 2 (two) times daily.    Marland Kitchen b complex vitamins tablet Take 1 tablet by mouth daily. With Zinc and vitamin C    . Benzocaine (BOIL-EASE EX) Apply 1 application topically daily as needed (Boil).    . Biotin 5000 MCG TABS Take 10,000 mcg by mouth daily. Keratin    . Cholecalciferol (VITAMIN D3) 50 MCG (2000 UT) TABS Take 4,000 Units by mouth daily.    . colestipol (COLESTID) 5 g packet Take 3 g by mouth daily. Do not take within 2 hours of other medications. 18 packet 0  . Cyanocobalamin 2500 MCG CHEW Chew 2,500 mcg by mouth daily.    . diphenhydrAMINE (BENADRYL) 50 MG capsule Take 48m 1 hour before scheduled CT 1 capsule 0  . ferrous sulfate 325 (65 FE) MG tablet Take 325 mg by mouth daily with breakfast.    . Fluticasone-Umeclidin-Vilant (TRELEGY ELLIPTA) 100-62.5-25 MCG/INH AEPB Inhale 1 puff into the lungs daily. 28 each 5  . Fluticasone-Umeclidin-Vilant (TRELEGY ELLIPTA) 100-62.5-25 MCG/INH AEPB Inhale 1 puff into the lungs daily. 60 each 0  . furosemide (LASIX) 40 MG tablet Take 1 tablet (40 mg total) by mouth 2 (two) times daily. (Patient taking differently: Take 40 mg by mouth daily. ) 30 tablet 1  . Ginkgo  Biloba Extract (GNP GINGKO BILOBA EXTRACT) 60 MG CAPS Take 1 capsule by mouth daily.    . hydrocortisone cream 1 % Apply 1 application topically daily as needed for itching.    .Marland Kitchenibuprofen (ADVIL) 200 MG tablet Take 200-600 mg by mouth every 6 (six) hours as needed for moderate pain.    .Marland KitchenLANTUS SOLOSTAR 100 UNIT/ML Solostar Pen Inject 30 Units into the skin daily.     .Marland Kitchenlevothyroxine (SYNTHROID) 100 MCG tablet Take 100 mcg by mouth daily before breakfast.    . lisinopril (ZESTRIL) 20 MG tablet Take 20 mg by mouth at bedtime.    .Marland KitchenLORazepam (ATIVAN) 0.5 MG tablet Take 1 tablet 30 minutes before the MRI.  Repeat once if needed. 2 tablet 0  . Menthol, Topical Analgesic, (ICY HOT) 7.5 % (Roll) MISC Apply 1 each topically daily as needed (Pain). spray     . metFORMIN (GLUCOPHAGE) 500 MG tablet Take 500 mg by mouth 2 (two) times daily with a meal.     . Multiple Vitamins-Minerals (MULTIVITAMIN WITH MINERALS) tablet Take 1 tablet by mouth daily. Adult 50+    . neomycin-bacitracin-polymyxin (NEOSPORIN) OINT Apply 1 application topically daily as needed for irritation or wound care.    . Omega 3-6-9 Fatty Acids (OMEGA 3-6-9 COMPLEX PO) Take 2 tablets by mouth daily.    .Marland KitchenoxyCODONE-acetaminophen (PERCOCET/ROXICET) 5-325 MG tablet Take 0.5 tablets by mouth  2 (two) times daily as needed for severe pain.    . pantoprazole (PROTONIX) 40 MG tablet Take 40 mg by mouth daily.    Marland Kitchen PARoxetine (PAXIL) 20 MG tablet Take 20 mg by mouth daily.     . predniSONE (DELTASONE) 50 MG tablet Take 50 mg 13 hours, 7 hours, and 1 hour before scheduled CT scan 3 tablet 0  . prochlorperazine (COMPAZINE) 10 MG tablet Take 1 tablet (10 mg total) by mouth every 6 (six) hours as needed. 30 tablet 2  . rosuvastatin (CRESTOR) 10 MG tablet Take 1 tablet (10 mg total) by mouth daily. 90 tablet 3  . traZODone (DESYREL) 150 MG tablet Take 150 mg by mouth at bedtime.      No current facility-administered medications on file prior to visit.

## 2020-06-14 NOTE — Progress Notes (Signed)
Patient here for a consult with Dr. [name].  CONSULT NOTE   REFERRING PHYSICIAN: Dr. Chesley Mires   REASON FOR CONSULTATION:  69 years old white female recently diagnosed with lung cancer.   HPI [name] is a 69 y.o. female with past medical history significant for COPD, diabetes mellitus, depression, degenerative disc disease, atherosclerotic cardiovascular disease, and anemia, amputation of the right hand, and anxiety and depression, deep venous thrombosis, hypertension, hypothyroidism, myocardial infarction, history of kidney stones as well as dyslipidemia and peripheral vascular disease.  The patient came today accompanied by another person, [name] who presented herself as the patient's advocate and started to speak for her.  The patient was very nice but her advocate was very hostile from the moment I entered the room and accused me of not allowing the patient to have her dog with her during the visit because of a ??? cultural issue even it was explained to the patient and her advocate at the entrance of the cancer center and registration that it is the health system policy which was presented to her not allow any dog unless they are a service dog and she has to present see documentation supporting that.  I also explained to them that I have nothing to do with the dog situation and it is not my decision to allow it or not.  The patient's advocate also mentions that she will take the patient to a different cancer center that allows her to have her dog with her during the visit and treatment.  I tried my best to cool down the situation but she was very strong in her opinion. The patient mentioned that she had CT scan of the chest without contrast on December 19, 2019 for evaluation of shortness of breath and dizzy spells.  The scan showed focal consolidation in the right middle lobe most consistent with pneumonia.  A mass or malignancy is not excluded.  There was also small bilateral pleural  effusions and findings of interstitial edema.  There was mildly enlarged bilateral hilar and mediastinal lymph nodes likely reactive.  She was referred to Dr. [name] and repeat CT scan of the chest without contrast on 04/12/2020 showed interval increase in the size of the masslike consolidation within the right middle lobe measured 5.5 x 4.2 x 3.4 cm.  There was persistent asymmetric enlargement of the right hilum concerning for adenopathy.  On 05/02/2020 the patient had a PET scan and it showed lateral segment right middle lobe mass measuring 4.6 x 5.8 cm with SUV max of 17.0.  There was new area of consolidation in the medial right lower lobe mildly hypermetabolic and likely infectious or inflammatory.  There was also hypermetabolic low right paratracheal lymph node measuring 1.7 cm with SUV max of 23.7 and hypermetabolic right hilar lymph node difficult to measure on the CT scan but has SUV max of 20.5.  On 05/20/2020 the patient underwent video bronchoscopy with endobronchial ultrasound and electromagnetic navigation procedure under the care of Dr. Lamonte Sakai. 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 The patient was referred to me today for evaluation and recommendation regarding treatment of her condition. When seen today the patient is complaining of fatigue as well as intermittent chest pain and cough productive of whitish sputum but no significant shortness of breath or hemoptysis.  She continues to have intermittent headache and occasional nausea.  She is followed by gastroenterology in [place] for history of persistent diarrhea.  The patient  has no recent weight loss or night sweats. Family history significant for mother died from lung cancer at age 22.  Father died from alcoholic complication and aneurysm.  She has 2 uncles one with lung cancer and the other one with brain cancer. The patient is single and has 2 children a son and daughter.  She was to  work as a [employer] manager.  She has a history for smoking up to 3 packs/day for around 50 years and quit 5 years ago.  She has no history of alcohol or drug abuse.    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     PLAN: I had a lengthy discussion with the patient and her advocate [name] about her current disease stage, prognosis and treatment options.  I personally and independently reviewed the scan images with the patient and her companion. I recommended for the patient to complete the staging work-up by ordering MRI of the brain to rule out brain metastasis. I explained to the patient if the MRI of the brain is negative for metastatic disease, she would have a limited stage small cell lung cancer which she has a potential curative outcome. I recommended for the patient a course of systemic chemotherapy with carboplatin for AUC of 5 on day 1, etoposide 100 mg/M2 on days 1, 2 and 3 with Neulasta support.  The patient will not be able to tolerate cisplatin because of her renal insufficiency. I discussed with the patient the adverse effect of this treatment including but not limited to alopecia, myelosuppression, nausea and vomiting, peripheral neuropathy, liver or renal dysfunction. She would like to start the treatment as soon as possible and she is expected to start the first dose of this treatment next week. I will arrange for the patient to have a chemotherapy education class before the first dose of her treatment. We may also consider the patient for Port-A-Cath placement if she has poor IV access especially with the amputation of the right hand. I will refer the patient to radiation oncology for evaluation and discussion of the concurrent course of radiotherapy for the limited stage disease. The patient will  come back for follow-up visit in 2 weeks for evaluation and management of any adverse effect of her treatment. I will call her pharmacy with prescription for Compazine 10 mg p.o. every 6 hours as needed for nausea. She was advised to call immediately if she has any concerning symptoms in the interval. The patient voices understanding of current disease status and treatment options and is in agreement with the current care plan.   All questions were answered. The patient knows to call the clinic with any problems, questions or concerns. We can certainly see the patient much sooner if necessary.   Thank you so much for allowing me to participate in the care of [employer]. I will continue to follow up the patient with you and assist in her care.   The total time spent in the appointment was 90 minutes.  Disclaimer: This note was dictated with voice recognition software. Similar sounding words can inadvertently be transcribed and may not be corrected upon review.     Eilleen Kempf May 30, 2020, 2:02 PM   Past/Anticipated interventions by cardiothoracic surgery, if any: no  Past/Anticipated interventions by medical oncology,  if any: has 3 more cycles of chemo  Signs/Symptoms Weight changes, if any: 187 lbs March wt now 142 lbs Respiratory complaints, if any:  NONE PER PATIENT Hemoptysis, if any: no Pain issues, if any:  no  SAFETY ISSUES: Prior radiation? no Pacemaker/ICD? no Possible current pregnancy? ongoing postmenopausal sx Is the patient on methotrexate? No  Current Complaints / other details:  [name] her friend is here with her.  BP 117/75 (BP Location: Right Arm, Patient Position: Sitting)   Pulse 72   Temp 98.3 F (36.8 C) (Oral)   Resp 19   Wt 145 lb (65.8 kg)   SpO2 98%   BMI 23.40 kg/m   Wt Readings from Last 3 Encounters:  06/15/20 145 lb (65.8 kg)  06/13/20 146 lb 1.6 oz (66.3 kg)  05/31/20 144 lb 9.6 oz (65.6 kg)

## 2020-06-15 ENCOUNTER — Encounter: Payer: Self-pay | Admitting: Radiation Oncology

## 2020-06-15 ENCOUNTER — Ambulatory Visit
Admission: RE | Admit: 2020-06-15 | Discharge: 2020-06-15 | Disposition: A | Discharge status: Home / Self Care | Payer: Medicare HMO | Source: Ambulatory Visit | Attending: Radiation Oncology | Admitting: Radiation Oncology

## 2020-06-15 VITALS — BP 117/75 | HR 72 | Temp 98.3°F | Resp 19 | Wt 145.0 lb

## 2020-06-15 DIAGNOSIS — E785 Hyperlipidemia, unspecified: Secondary | ICD-10-CM | POA: Insufficient documentation

## 2020-06-15 DIAGNOSIS — Z8719 Personal history of other diseases of the digestive system: Secondary | ICD-10-CM | POA: Insufficient documentation

## 2020-06-15 DIAGNOSIS — K7460 Unspecified cirrhosis of liver: Secondary | ICD-10-CM | POA: Insufficient documentation

## 2020-06-15 DIAGNOSIS — K219 Gastro-esophageal reflux disease without esophagitis: Secondary | ICD-10-CM | POA: Insufficient documentation

## 2020-06-15 DIAGNOSIS — I700 Atherosclerosis of aorta: Secondary | ICD-10-CM | POA: Diagnosis not present

## 2020-06-15 DIAGNOSIS — C342 Malignant neoplasm of middle lobe, bronchus or lung: Secondary | ICD-10-CM

## 2020-06-15 DIAGNOSIS — Z794 Long term (current) use of insulin: Secondary | ICD-10-CM | POA: Diagnosis not present

## 2020-06-15 DIAGNOSIS — J449 Chronic obstructive pulmonary disease, unspecified: Secondary | ICD-10-CM | POA: Diagnosis not present

## 2020-06-15 DIAGNOSIS — Z87891 Personal history of nicotine dependence: Secondary | ICD-10-CM | POA: Insufficient documentation

## 2020-06-15 DIAGNOSIS — Z86010 Personal history of colonic polyps: Secondary | ICD-10-CM | POA: Insufficient documentation

## 2020-06-15 DIAGNOSIS — I252 Old myocardial infarction: Secondary | ICD-10-CM | POA: Insufficient documentation

## 2020-06-15 DIAGNOSIS — E039 Hypothyroidism, unspecified: Secondary | ICD-10-CM | POA: Diagnosis not present

## 2020-06-15 DIAGNOSIS — I2510 Atherosclerotic heart disease of native coronary artery without angina pectoris: Secondary | ICD-10-CM | POA: Diagnosis not present

## 2020-06-15 DIAGNOSIS — Z86718 Personal history of other venous thrombosis and embolism: Secondary | ICD-10-CM | POA: Insufficient documentation

## 2020-06-15 DIAGNOSIS — E1151 Type 2 diabetes mellitus with diabetic peripheral angiopathy without gangrene: Secondary | ICD-10-CM | POA: Diagnosis not present

## 2020-06-15 DIAGNOSIS — C7951 Secondary malignant neoplasm of bone: Secondary | ICD-10-CM | POA: Insufficient documentation

## 2020-06-15 DIAGNOSIS — Z79899 Other long term (current) drug therapy: Secondary | ICD-10-CM | POA: Insufficient documentation

## 2020-06-15 DIAGNOSIS — J90 Pleural effusion, not elsewhere classified: Secondary | ICD-10-CM | POA: Insufficient documentation

## 2020-06-15 NOTE — Progress Notes (Signed)
Radiation Oncology         ([phone]) [phone] ________________________________  Initial Outpatient Consultation  Name: [name] MRN: [id number]  Date: 06/15/2020  DOB: 1951/02/23  CV:ELFY, [name], MD  Curt Bears, MD   REFERRING PHYSICIAN: Curt Bears, MD  DIAGNOSIS: The encounter diagnosis was Small cell lung cancer, right middle lobe [facility]).  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 female who is seen as a courtesy of Dr. [name] for an opinion concerning radiation therapy as part of management for her recently diagnosed lung cancer. Today, she is accompanied by good friend. The patient presented to the ED on 12/19/2019 with chief complaint of shortness of breath. Chest x-ray at that time showed an ovoid opacity in the right mid lung field that may have represented round atelectasis, infiltrate, loculated fluid within the fissure, or lung mass. CT of chest showed a focal consolidation in the right middle lobe most consistent with pneumonia but a mass or malignancy could not be excluded. It also showed small bilateral pleural effusions that were consistent with interstitial edema, mildly enlarged bilateral hilar and mediastinal lymph nodes that were likely reactive, and cirrhosis with small ascites. That patient was admitted to the hospital for further evaluation and management.  While admitted, the patient underwent a RUQ abdominal ultrasound on 12/21/2019 to further evaluate the cirrhosis seen on the CT scan. Results showed a cirrhotic liver with trace perihepatic ascites and a small right pleural effusion. She then underwent an EGD, coloscopy, and polypectomy on 12/23/2019 (Dr. Maud Deed). EGD showed a normal esophagus, erythematous mucosa in the antrum, and a  normal examined duodenum. Colonoscopy showed one 7 mm polyp in the ascending colon that was removed with a hot snare, resecteds, and retrieved. It also showed a single non-bleeding colonic angioectasia that was treated with argon plasma coagulation. Finally, there was friable (with contact bleeding) mucosa in the trasverse colon for which a clip (MR condidtional) was placed. Pathology from the polypectomy revealed colonic mucosa with submucosal adipose tissue that was consistent with lipoma. However, there was no adenomatous change or carcinoma. She was stabilized and discharged home on 12/24/2019.  The patient was seen by Dr. Virgina Jock, cardiologist, on 12/31/2019. She was noted to have orthostatic hypotension and there was concern for possible acute blood loss anemia. Thus, she was sent to the ED for further evaluation. Chest x-ray at that time showed an unchanged, mild, diffuse, interstitial opacity. No new airspace opacity was noted. Additionally, there was an unchanged mass-like opacity of the right mid lung. After completion of work-up and plan to admit, the patient signed out [place].  On 01/09/2020, the patient was found to have a critical potassium level of 6.7. She was referred back to the ED by Dr. Virgina Jock. Chest x-ray at that time showed a persistent mass-like consolidation adjacent to the minor fissure. She was once again found to be hyperkalemic and was admitted to the hospital for further evaluation and management. She was stabilized and discharged home on 01/11/2020.   Given the patient's history of iron deficiency anemia and chronic diarrhea, she underwent a small bowel givens capsule study on 03/22/2020 under the care of Dr. Gala Romney. Findings showed a few gastric erosions, small AVM, scattered small bowel erosions, a couple of small bowel AVMs, and possible small ulcerations at  two different locations. No active GI bleeding or obvious masses were seen.  The patient was referred to Dr. [name],  critical care, for further evaluation of the right-sided lung mass and was seen in consultation on 03/22/2020. At that time, it was recommended that she proceed with a repeat chest CT scan followed by bronchoscopic biopsy, if indicated.  Chest CT scan was performed on 04/12/2020 and showed an interval increase in size of the mass-like consolidation within the right middle lobe. Findings were worrisome for malignancy. It also showed persistent, asymmetric enlargement of the right hilum that was concerning for adenopathy. The diffusely increased interstitial markings throughout both lungs with peripheral predominant interstitial reticulation were similar in appearance. Findings were non-specific and may have reflected underlying interstitial lung disease.  The patient then underwent a PET scan on 05/02/2020 that showed a hypermetabolic right middle lobe mass with hypermetabolic right hilar and low right paratracheal adenopathy, most consistent with primary bronchogenic carcinoma. It also showed trace bilateral pleural effusions, cirrhosis, a mildly hypermetabolic thyroid, aortic atherosclerosis, and emphysema.   Given the above findings, the patient underwent a video bronchoscopy with endobronchial ultrasound and electromagnetic navigation on 05/20/2020 (Dr. Lamonte Sakai). Cytology from the procedure revealed malignant cells consistent with small cell carcinoma in the fine needle aspiration of lymph node 4R. Fine needle aspirations of lymph nodes 12L and 4L did not show any malignant cells.  The patient was referred to Dr. [name] and was seen in consultation on 05/30/2020. At that time, it was recommended that she proceed with brain MRI and systemic chemotherapy with Carboplatin, Etoposide, and Neulasta support.  MRI of brain on 06/08/2020 showed a solitary 4 mm metastasis in the midline cerebellum with mild vasogenic edema. She also underwent a CTA of abdomen and pelvis on that same day for abdominal pain.  Results did not show significant proximal mesenteric arterial occlusive disease to suggest an etiology of mesenteric ischemia. However, it did show bilateral renal artery ostial stenosis, right mid lung mass that was incompletely visualized, coronary and aortic atherosclerosis, cirrhosis, and bilateral femoral head AVN without subchondral collapse.  The patient was last seen by Dr. [name] on 06/13/2020. At that time, she had completed her first cycle of chemotherapy and had tolerated it fairly well. She is to proceed with her second cycle in two weeks.  PREVIOUS RADIATION THERAPY: No  PAST MEDICAL HISTORY:  Past Medical History:  Diagnosis Date  . Allergic rhinitis   . 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 Anemia   . Anxiety   . ASCVD (arteriosclerotic cardiovascular disease)    MI in 96 requiring BMS CX; DES to M1 in 2000;normal coronary angiography in 2004  . Cholelithiasis   . COPD (chronic obstructive pulmonary disease) ([place])   . DDD (degenerative disc disease), lumbar   . Depression   . Diabetes mellitus    Type II  . Diarrhea   . DVT (deep venous thrombosis) ([place])    patient said no  . GERD (gastroesophageal reflux disease)   . Headache    migraine  . History of kidney stones    2006  . Hyperlipidemia   . Hypotension   . Hypothyroidism   . Low back pain   . Myocardial infarction ([place]) 01/1995  . Nephrolithiasis 2006   stone extraction   . Panic attacks   . Peripheral neuropathy   . Peripheral vascular disease ([facility])    legs  . Pneumonia 12/22/2019  . Sciatic pain  right  . Tobacco abuse   . Tremor     PAST SURGICAL HISTORY: Past Surgical History:  Procedure Laterality Date  . AGILE CAPSULE N/A 03/07/2020   Procedure: AGILE CAPSULE;  Surgeon: Daneil Dolin, MD;  [facility];  Service: Endoscopy;  Laterality: N/A;  7:30am  . BACK SURGERY     fusion  . BRONCHIAL NEEDLE ASPIRATION BIOPSY N/A 05/20/2020   Procedure: BRONCHIAL NEEDLE  ASPIRATION BIOPSIES;  Surgeon: Collene Gobble, MD;  Location: [facility] ENDOSCOPY;  Service: Pulmonary;  Laterality: N/A;  . CHOLECYSTECTOMY    . COLONOSCOPY  01/2009   UVO:[id number] rectum/repeat in 5 yrs  . COLONOSCOPY N/A 04/29/2014   Dr.Rourk- attempted/incomplete colonoscopy. inadequate prep  . COLONOSCOPY N/A 05/27/2014   IHK:[id number] coli. Colonic polyps-removed as described above.Status post segmental biopsy. single tubular adenoma and random colon bx neg  . COLONOSCOPY WITH PROPOFOL N/A 12/23/2019   Procedure: COLONOSCOPY WITH PROPOFOL;  Surgeon: Ronnette Juniper, MD;  Location: [place];  Service: Gastroenterology;  Laterality: N/A;  . DILATION AND CURETTAGE OF UTERUS  1974  . ESOPHAGOGASTRODUODENOSCOPY  05/2010   Dr. Tessie Fass, erosion. 66F dilation  . ESOPHAGOGASTRODUODENOSCOPY N/A 04/29/2014   Dr.Rourk- normal esophagus s/p passage of maloney dilator. small hiatal hernia- bx= chronic inflammation.  . ESOPHAGOGASTRODUODENOSCOPY (EGD) WITH PROPOFOL N/A 12/23/2019   Procedure: ESOPHAGOGASTRODUODENOSCOPY (EGD) WITH PROPOFOL;  Surgeon: Ronnette Juniper, MD;  Location: [place];  Service: Gastroenterology;  Laterality: N/A;  . GIVENS CAPSULE STUDY N/A 03/22/2020   Procedure: GIVENS CAPSULE STUDY;  Surgeon: Daneil Dolin, MD;  [facility];  Service: Endoscopy;  Laterality: N/A;  7:30am  . HEMOSTASIS CLIP PLACEMENT  12/23/2019   Procedure: HEMOSTASIS CLIP PLACEMENT;  Surgeon: Ronnette Juniper, MD;  Location: [place];  Service: Gastroenterology;;  . HOT HEMOSTASIS N/A 12/23/2019   Procedure: HOT HEMOSTASIS (ARGON PLASMA COAGULATION/BICAP);  Surgeon: Ronnette Juniper, MD;  Location: [place];  Service: Gastroenterology;  Laterality: N/A;  . LEFT HEART CATH AND CORONARY ANGIOGRAPHY N/A 12/24/2019   Procedure: LEFT HEART CATH AND CORONARY ANGIOGRAPHY;  Surgeon: Nigel Mormon, MD;  Location: [place] CV LAB;  Service: Cardiovascular;  Laterality: N/A;  . Venia Minks DILATION N/A 04/29/2014    Procedure: Venia Minks DILATION;  Surgeon: Daneil Dolin, MD;  [facility];  Service: Endoscopy;  Laterality: N/A;  . PARTIAL HYSTERECTOMY  1978  . POLYPECTOMY  12/23/2019   Procedure: POLYPECTOMY;  Surgeon: Ronnette Juniper, MD;  Location: [facility] ENDOSCOPY;  Service: Gastroenterology;;  . SHOULDER SURGERY Left    Left shoulder for RTC;left arm surgery '98/left hand surgery 2001  . TOTAL ABDOMINAL HYSTERECTOMY W/ BILATERAL SALPINGOOPHORECTOMY  2002  . UMBILICAL HERNIA REPAIR  2008  . VIDEO BRONCHOSCOPY WITH ENDOBRONCHIAL ULTRASOUND N/A 05/20/2020   Procedure: VIDEO BRONCHOSCOPY WITH ENDOBRONCHIAL ULTRASOUND;  Surgeon: Collene Gobble, MD;  Location: [facility] ENDOSCOPY;  Service: Pulmonary;  Laterality: N/A;    FAMILY HISTORY:  Family History  Problem Relation Age of Onset  . Depression Mother   . Bipolar disorder Mother   . Dementia Mother   . Pulmonary fibrosis Mother   . Alcohol abuse Father   . Aneurysm Father        deceased age 10, brain  . Colon cancer Paternal Grandfather        age greater than 43    SOCIAL HISTORY:  Social History   Tobacco Use  . Smoking status: Former Smoker    Packs/day: 0.50    Years: 40.00  Pack years: 20.00    Types: Cigarettes    Quit date: 08/30/2017    Years since quitting: 2.7  . Smokeless tobacco: Never Used  Vaping Use  . Vaping Use: Never used  Substance Use Topics  . Alcohol use: No  . Drug use: No    ALLERGIES:  Allergies  Allergen Reactions  . Iohexol      Desc: CHEST TIGHTNESS,BRETHING PROBLEMS NEEDS PRE MEDS 13 hour premeds given 06/08/2020 without incident   . Tape Other (See Comments)    Tears skin  . Ciprofloxacin Rash  . Latex Rash    GLOVES   . Penicillins Swelling    Did it involve swelling of the face/tongue/throat, SOB, or low BP? Yes Did it involve sudden or severe rash/hives, skin peeling, or any reaction on the inside of your mouth or nose? No Did you need to seek medical attention at a hospital or doctor's  office? Yes When did it last happen?1996 If all above answers are "NO", may proceed with cephalosporin use.  . Povidone-Iodine Rash    MEDICATIONS:  Current Outpatient Medications  Medication Sig Dispense Refill  . carbamide peroxide (DEBROX) 6.5 % OTIC solution 5 drops daily as needed.    . predniSONE (DELTASONE) 50 MG tablet Take 50 mg 13 hours, 7 hours, and 1 hour before scheduled CT scan 3 tablet 0  . pyridOXINE (VITAMIN B-6) 50 MG tablet Take 200 mg by mouth daily.    Marland Kitchen albuterol (PROVENTIL HFA;VENTOLIN HFA) 108 (90 BASE) MCG/ACT inhaler Inhale 2 puffs into the lungs every 4 (four) hours as needed for wheezing or shortness of breath. 1 Inhaler 0  . aspirin-acetaminophen-caffeine (EXCEDRIN MIGRAINE) 250-250-65 MG tablet Take 2 tablets by mouth daily as needed for headache. Up to 4 times a week    . azelastine (OPTIVAR) 0.05 % ophthalmic solution Place 1 drop into both eyes 2 (two) times daily.    Marland Kitchen b complex vitamins tablet Take 1 tablet by mouth daily. With Zinc and vitamin C    . Benzocaine (BOIL-EASE EX) Apply 1 application topically daily as needed (Boil).    . Biotin 5000 MCG TABS Take 10,000 mcg by mouth daily. Keratin    . Cholecalciferol (VITAMIN D3) 50 MCG (2000 UT) TABS Take 4,000 Units by mouth daily.    . colestipol (COLESTID) 5 g packet Take 3 g by mouth daily. Do not take within 2 hours of other medications. 18 packet 0  . Cyanocobalamin 2500 MCG CHEW Chew 2,500 mcg by mouth daily.    . diphenhydrAMINE (BENADRYL) 50 MG capsule Take 74m 1 hour before scheduled CT (Patient not taking: Reported on 06/15/2020) 1 capsule 0  . ferrous sulfate 325 (65 FE) MG tablet Take 325 mg by mouth daily with breakfast.    . Fluticasone-Umeclidin-Vilant (TRELEGY ELLIPTA) 100-62.5-25 MCG/INH AEPB Inhale 1 puff into the lungs daily. 28 each 5  . Fluticasone-Umeclidin-Vilant (TRELEGY ELLIPTA) 100-62.5-25 MCG/INH AEPB Inhale 1 puff into the lungs daily. 60 each 0  . furosemide (LASIX) 40 MG  tablet Take 1 tablet (40 mg total) by mouth 2 (two) times daily. (Patient taking differently: Take 40 mg by mouth daily. ) 30 tablet 1  . Ginkgo Biloba Extract (GNP GINGKO BILOBA EXTRACT) 60 MG CAPS Take 2 capsules by mouth daily.     . hydrocortisone cream 1 % Apply 1 application topically daily as needed for itching.    .Marland Kitchenibuprofen (ADVIL) 200 MG tablet Take 200-600 mg by mouth every 6 (six) hours as  needed for moderate pain.    Marland Kitchen LANTUS SOLOSTAR 100 UNIT/ML Solostar Pen Inject 16 Units into the skin daily.     Marland Kitchen levothyroxine (SYNTHROID) 100 MCG tablet Take 100 mcg by mouth daily before breakfast.    . lisinopril (ZESTRIL) 20 MG tablet Take 20 mg by mouth at bedtime.    Marland Kitchen LORazepam (ATIVAN) 0.5 MG tablet Take 1 tablet 30 minutes before the MRI.  Repeat once if needed. (Patient not taking: Reported on 06/15/2020) 2 tablet 0  . Menthol, Topical Analgesic, (ICY HOT) 7.5 % (Roll) MISC Apply 1 each topically daily as needed (Pain). spray     . metFORMIN (GLUCOPHAGE) 500 MG tablet Take 500 mg by mouth 2 (two) times daily with a meal.     . Multiple Vitamins-Minerals (MULTIVITAMIN WITH MINERALS) tablet Take 1 tablet by mouth daily. Adult 50+    . neomycin-bacitracin-polymyxin (NEOSPORIN) OINT Apply 1 application topically daily as needed for irritation or wound care.    . Omega 3-6-9 Fatty Acids (OMEGA 3-6-9 COMPLEX PO) Take 2 tablets by mouth daily.    Marland Kitchen oxyCODONE-acetaminophen (PERCOCET/ROXICET) 5-325 MG tablet Take 0.5 tablets by mouth 2 (two) times daily as needed for severe pain.    . pantoprazole (PROTONIX) 40 MG tablet Take 40 mg by mouth daily.    Marland Kitchen PARoxetine (PAXIL) 20 MG tablet Take 20 mg by mouth daily.     . prochlorperazine (COMPAZINE) 10 MG tablet Take 1 tablet (10 mg total) by mouth every 6 (six) hours as needed. 30 tablet 2  . rosuvastatin (CRESTOR) 10 MG tablet Take 1 tablet (10 mg total) by mouth daily. 90 tablet 3  . traZODone (DESYREL) 150 MG tablet Take 150 mg by mouth at bedtime.       No current facility-administered medications for this encounter.    REVIEW OF SYSTEMS:  A 10+ POINT REVIEW OF SYSTEMS WAS OBTAINED including neurology, dermatology, psychiatry, cardiac, respiratory, lymph, extremities, GI, GU, musculoskeletal, constitutional, reproductive, HEENT.  Patient has some mild nausea with her chemotherapy but overall tolerated well.  She denies any significant breathing problems cough or hemoptysis.  She has chronic headaches which she relates to migraines, no visual issues.   PHYSICAL EXAM:  weight is 145 lb (65.8 kg). Her oral temperature is 98.3 F (36.8 C). Her blood pressure is 117/75 and her pulse is 72. Her respiration is 19 and oxygen saturation is 98%.   General: Alert and oriented, in no acute distress HEENT: Head is normocephalic. Extraocular movements are intact. Oropharynx is clear.  Edentulous Neck: Neck is supple, no palpable cervical or supraclavicular lymphadenopathy. Heart: Regular in rate and rhythm with no murmurs, rubs, or gallops. Chest: Clear to auscultation bilaterally, with no rhonchi, wheezes, or rales. Abdomen: Soft, nontender, nondistended, with no rigidity or guarding. Extremities: No cyanosis or edema. Lymphatics: see Neck Exam Skin: No concerning lesions. Musculoskeletal: symmetric strength and muscle tone throughout.  Patient experienced a traumatic amputation of her right hand and wrist.  She wears a dressing over this area to protect the area Neurologic: Cranial nerves II through XII are grossly intact. No obvious focalities. Speech is fluent. Coordination is intact. Psychiatric: Judgment and insight are intact. Affect is appropriate.   ECOG = 1  0 - Asymptomatic (Fully active, able to carry on all predisease activities without restriction)  1 - Symptomatic but completely ambulatory (Restricted in physically strenuous activity but ambulatory and able to carry out work of a light or sedentary nature. For example, light  housework,  office work)  2 - Symptomatic, <50% in bed during the day (Ambulatory and capable of all self care but unable to carry out any work activities. Up and about more than 50% of waking hours)  3 - Symptomatic, >50% in bed, but not bedbound (Capable of only limited self-care, confined to bed or chair 50% or more of waking hours)  4 - Bedbound (Completely disabled. Cannot carry on any self-care. Totally confined to bed or chair)  5 - Death   Eustace Pen MM, Creech RH, Tormey DC, et al. [phone]). "Toxicity and response criteria of the [facility]". [place] Oncol. 5 (6): 649-55  LABORATORY DATA:  Lab Results  Component Value Date   WBC 16.1 (H) 06/13/2020   HGB 8.5 (L) 06/13/2020   HCT 26.8 (L) 06/13/2020   MCV 92.4 06/13/2020   PLT 196 06/13/2020   NEUTROABS 13.2 (H) 06/13/2020   Lab Results  Component Value Date   NA 136 06/13/2020   K 4.6 06/13/2020   CL 104 06/13/2020   CO2 22 06/13/2020   GLUCOSE 148 (H) 06/13/2020   CREATININE 1.37 (H) 06/13/2020   CALCIUM 9.9 06/13/2020      RADIOGRAPHY: MR Brain W Wo Contrast  Result Date: 06/09/2020 CLINICAL DATA:  Small cell lung cancer staging EXAM: MRI HEAD WITHOUT AND WITH CONTRAST TECHNIQUE: Multiplanar, multiecho pulse sequences of the brain and surrounding structures were obtained without and with intravenous contrast. CONTRAST:  85m GADAVIST GADOBUTROL 1 MMOL/ML IV SOLN COMPARISON:  09/05/2014 brain MRI FINDINGS: Brain: Enhancing nodular lesion in the midline cerebellum measuring 4 mm on coronal postcontrast imaging, with mild adjacent vasogenic edema. Subtle nodular densities marked on axial slices more laterally in the cerebellum are not seen on coronal acquisition and attributed to artifact. No incidental infarct, hemorrhage, hydrocephalus, or collection. Mild chronic small vessel disease in the hemispheric white matter. Vascular: Normal flow voids and vascular enhancement Skull and upper cervical spine:  No evidence of bony metastasis Sinuses/Orbits: Negative for mass. IMPRESSION: Solitary 4 mm metastasis in the midline cerebellum with mild vasogenic edema. Electronically Signed   By: JMonte FantasiaM.D.   On: 06/09/2020 07:41   CT Angio Abd/Pel w/ and/or w/o  Result Date: 06/09/2020 CLINICAL DATA:  Mesenteric ischemia, weight loss, abdominal pain. History of small cell lung carcinoma post chemotherapy. EXAM: CTA ABDOMEN AND PELVIS WITH CONTRAST TECHNIQUE: Multidetector CT imaging of the abdomen and pelvis was performed using the standard protocol during bolus administration of intravenous contrast. Multiplanar reconstructed images and MIPs were obtained and reviewed to evaluate the vascular anatomy. CONTRAST:  852mOMNIPAQUE IOHEXOL 350 MG/ML SOLN COMPARISON:  05/02/2020 and previous FINDINGS: VASCULAR Aorta: Moderate calcified atheromatous plaque. No high-grade stenosis. No aneurysm or dissection. 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 Celiac: Patent without evidence of aneurysm, dissection, vasculitis or significant stenosis. SMA: Patent without evidence of aneurysm, dissection, vasculitis or significant stenosis. Renals: Single left, with irregular plaque extending from the ostium a long length of at least 2.1 cm resulting in at least moderate stenosis. Single right renal artery, with calcified ostial plaque along the length of at least 1.5 cm resulting in at least mild stenosis, patent distally. IMA: Short-segment origin occlusion or stenosis, reconstituted distally by visceral collaterals. Inflow: Scattered calcified nonocclusive plaque. No aneurysm or dissection. Proximal Outflow: Atheromatous, patent Veins: Patent hepatic veins, portal vein, SMV, splenic vein, bilateral renal veins. Unremarkable iliac venous system and IVC. No venous pathology evident. Review of the MIP images confirms the  above findings. NON-VASCULAR Lower chest: Right mid lung mass, incompletely visualized.  Coronary calcifications. No pleural or pericardial effusion. Hepatobiliary: Nodular liver contour without focal lesion or biliary ductal dilatation. Cholecystectomy clips. Pancreas: Unremarkable. No pancreatic ductal dilatation or surrounding inflammatory changes. Spleen: Normal in size without focal abnormality. Adrenals/Urinary Tract: Adrenal glands unremarkable. Symmetric renal enhancement. No hydronephrosis. Urinary bladder incompletely distended. Stomach/Bowel: Stomach is nondistended. Small-bowel decompressed. Normal appendix. The colon is nondilated, unremarkable. Lymphatic: No abdominal or pelvic adenopathy. Stable subcentimeter left para-aortic and aortocaval mesenteric lymph nodes since 10/30/2013. Reproductive: Status post hysterectomy. No adnexal masses. Other: Right pelvic phleboliths.  No ascites.  No free air. Musculoskeletal: Postop and degenerative changes in the lower lumbar spine. 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 No fracture or worrisome bone lesion. IMPRESSION: 1. No significant proximal mesenteric arterial occlusive disease to suggest an etiology of mesenteric ischemia. 2. Bilateral renal artery ostial stenoses. 3. Right mid lung mass, incompletely visualized. 4. Coronary and aortic Atherosclerosis (ICD10-I70.0). 5. Cirrhosis. 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 Electronically Signed   By: Lucrezia Europe M.D.   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  Given her relatively young age and performance status she would be a candidate for aggressive therapy including radiation treatments to the chest along with chemotherapy as above.  Given the minimal metastatic disease in the brain we will hold off on  whole brain radiation therapy and reassess with another MRI of the brain after she has completed her chemotherapy.  Given the patient's memory issues already she is hesitant to consider prophylactic cranial radiation should she become in remission.  Today, I talked to the patient and friend about the findings and work-up thus far.  We discussed the natural history of small cell lung cancer and general treatment, highlighting the role of radiotherapy in the management.  We discussed the available radiation techniques, and focused on the details of logistics and delivery.  We reviewed the anticipated acute and late sequelae associated with radiation in this setting.  The patient was encouraged to ask questions that I answered to the best of my ability.  A patient consent form was discussed and signed.  We retained a copy for our records.  The patient would like to proceed with radiation and will be scheduled for CT simulation.  PLAN: CT simulation next week with treatments to begin approximately 2 weeks from now, concomitant with her second cycle of chemotherapy.  Total time spent in this encounter was 60 minutes which included reviewing the patient's most recent ED visits, hospital admissions, consultations, follow-ups, chest x-rays, CT scans, PET scan, procedures, biopsies, pathology reports, chemotherapy, physical examination, and documentation.  ------------------------------------------------  Blair Promise, PhD, MD  This document serves as a record of services personally performed by [name], MD. It was created on his behalf by Clerance Lav, a trained medical scribe. The creation of this record is based on the scribe's personal observations and the provider's statements to them. This document has been checked and approved by the attending provider.

## 2020-06-16 ENCOUNTER — Telehealth: Payer: Self-pay | Admitting: Internal Medicine

## 2020-06-16 NOTE — Telephone Encounter (Signed)
Scheduled per los. Called and left msg. Mailed printout  °

## 2020-06-21 ENCOUNTER — Telehealth: Payer: Self-pay

## 2020-06-21 ENCOUNTER — Ambulatory Visit: Payer: Medicare HMO | Admitting: Radiation Oncology

## 2020-06-21 ENCOUNTER — Inpatient Hospital Stay: Payer: Medicare HMO | Attending: Physician Assistant

## 2020-06-21 DIAGNOSIS — Z5111 Encounter for antineoplastic chemotherapy: Secondary | ICD-10-CM | POA: Insufficient documentation

## 2020-06-21 DIAGNOSIS — Z5189 Encounter for other specified aftercare: Secondary | ICD-10-CM | POA: Insufficient documentation

## 2020-06-21 DIAGNOSIS — C7931 Secondary malignant neoplasm of brain: Secondary | ICD-10-CM | POA: Insufficient documentation

## 2020-06-21 DIAGNOSIS — C342 Malignant neoplasm of middle lobe, bronchus or lung: Secondary | ICD-10-CM | POA: Insufficient documentation

## 2020-06-21 NOTE — Telephone Encounter (Signed)
Returned patient's call and LM on her VM to return call. Contact # provided.

## 2020-06-22 ENCOUNTER — Ambulatory Visit
Admission: RE | Admit: 2020-06-22 | Discharge: 2020-06-22 | Disposition: A | Discharge status: Home / Self Care | Payer: Medicare HMO | Source: Ambulatory Visit | Attending: Radiation Oncology | Admitting: Radiation Oncology

## 2020-06-22 ENCOUNTER — Other Ambulatory Visit: Payer: Self-pay

## 2020-06-22 DIAGNOSIS — C7931 Secondary malignant neoplasm of brain: Secondary | ICD-10-CM | POA: Diagnosis present

## 2020-06-22 DIAGNOSIS — Z87891 Personal history of nicotine dependence: Secondary | ICD-10-CM | POA: Diagnosis not present

## 2020-06-22 DIAGNOSIS — C342 Malignant neoplasm of middle lobe, bronchus or lung: Secondary | ICD-10-CM | POA: Insufficient documentation

## 2020-06-22 DIAGNOSIS — Z510 Encounter for antineoplastic radiation therapy: Secondary | ICD-10-CM | POA: Diagnosis not present

## 2020-06-23 ENCOUNTER — Telehealth: Payer: Self-pay | Admitting: Gastroenterology

## 2020-06-23 DIAGNOSIS — Z510 Encounter for antineoplastic radiation therapy: Secondary | ICD-10-CM | POA: Diagnosis not present

## 2020-06-23 NOTE — Telephone Encounter (Signed)
----- Message from Rosetta Posner, MD sent at 06/13/2020  3:10 PM EDT ----- I am sorry to hear about the lung cancer.  I reviewed her CT scan.  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  No evidence of mesenteric ischemia.  I would not further evaluate or treat her bilateral renal artery stenosis and that she has progressive renal insufficiency.  It looks like her current creatinine is 1.5.  Please let me know if we can assist ----- Message ----- From: Westly Pam Sent: 06/10/2020   7:40 AM EDT To: Rosetta Posner, MD, Mahala Menghini, PA-C  Dr. Donnetta Hutching,   This is a mutual patient. She has been diagnosed with lung cancer and undergoing chemotherapy. CTA A/P was done to rule out mesenteric ischemia.   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  I just wanted to reach out and give you an FYI in case this is something that would prompt a follow up with you. I wasn't sure if aorta mural thrombus required treatment at this point.   Magda Paganini     CLINICAL DATA:  Mesenteric ischemia, weight loss, abdominal pain. History of small cell lung carcinoma post chemotherapy.  EXAM: CTA ABDOMEN AND PELVIS WITH CONTRAST  TECHNIQUE: Multidetector CT imaging of the abdomen and pelvis was performed using the standard protocol during bolus administration of intravenous contrast. Multiplanar reconstructed images and MIPs were obtained and reviewed to evaluate the vascular anatomy.  CONTRAST:  52m OMNIPAQUE IOHEXOL 350 MG/ML SOLN  COMPARISON:  05/02/2020 and previous  FINDINGS: VASCULAR  Aorta: Moderate calcified atheromatous plaque. No high-grade stenosis. No aneurysm or dissection. 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  Celiac: Patent without evidence of aneurysm, dissection, vasculitis or significant stenosis.  SMA: Patent without  evidence of aneurysm, dissection, vasculitis or significant stenosis.  Renals: Single left, with irregular plaque extending from the ostium a long length of at least 2.1 cm resulting in at least moderate stenosis. Single right renal artery, with calcified ostial plaque along the length of at least 1.5 cm resulting in at least mild stenosis, patent distally.  IMA: Short-segment origin occlusion or stenosis, reconstituted distally by visceral collaterals.  Inflow: Scattered calcified nonocclusive plaque. No aneurysm or dissection.  Proximal Outflow: Atheromatous, patent  Veins: Patent hepatic veins, portal vein, SMV, splenic vein, bilateral renal veins. Unremarkable iliac venous system and IVC. No venous pathology evident.  Review of the MIP images confirms the above findings.  NON-VASCULAR  Lower chest: Right mid lung mass, incompletely visualized. Coronary calcifications. No pleural or pericardial effusion.  Hepatobiliary: Nodular liver contour without focal lesion or biliary ductal dilatation. Cholecystectomy clips.  Pancreas: Unremarkable. No pancreatic ductal dilatation or surrounding inflammatory changes.  Spleen: Normal in size without focal abnormality.  Adrenals/Urinary Tract: Adrenal glands unremarkable. Symmetric renal enhancement. No hydronephrosis. Urinary bladder incompletely distended.  Stomach/Bowel: Stomach is nondistended. Small-bowel decompressed. Normal appendix. The colon is nondilated, unremarkable.  Lymphatic: No abdominal or pelvic adenopathy. Stable subcentimeter left para-aortic and aortocaval mesenteric lymph nodes since 10/30/2013.  Reproductive: Status post hysterectomy. No adnexal masses.  Other: Right pelvic phleboliths.  No ascites.  No free air.  Musculoskeletal: Postop and degenerative changes in the lower lumbar spine. 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 No fracture or worrisome bone  lesion.  IMPRESSION: 1. No significant proximal mesenteric arterial occlusive disease to suggest an etiology of mesenteric ischemia.  2. Bilateral renal artery ostial stenoses. 3. Right mid lung mass, incompletely visualized. 4. Coronary and aortic Atherosclerosis (ICD10-I70.0). 5. Cirrhosis. 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   Electronically Signed   By: Lucrezia Europe M.D.   On: 06/09/2020 11:03

## 2020-06-23 NOTE — Telephone Encounter (Signed)
1) Please let pt know I have reviewed the CTA with Dr. Donnetta Hutching with [facility]. He states the aortic thrombus and bilateral renal artery stenosis do not need treatment right now. If her kidney function declines then would consider.   2) I will want to see her in six months for her cirrhosis. Please make appt.  3) She is supposed to see me next month for diarrhea. If she is doing ok and needs to cancel that appt given everything going on with her lung cancer treatments she can. I will be glad to see her if she needs me to.

## 2020-06-23 NOTE — Telephone Encounter (Signed)
Spoke with pt. Pt was notified of recommendation of Dr. Donnetta Hutching with [facility]. Pt is aware of her appointment date to discuss diarrhea and wants to keep for now. Pt may call back and cancel apt. 6 month apt was made. Pt is aware of that appointment date.

## 2020-06-27 ENCOUNTER — Encounter: Payer: Self-pay | Admitting: Pulmonary Disease

## 2020-06-27 ENCOUNTER — Telehealth: Payer: Self-pay | Admitting: Pulmonary Disease

## 2020-06-27 ENCOUNTER — Other Ambulatory Visit: Payer: Self-pay

## 2020-06-27 MED ORDER — TRELEGY ELLIPTA 100-62.5-25 MCG/INH IN AEPB: 1.0000 | INHALATION_SPRAY | Freq: Every day | RESPIRATORY_TRACT | 0 refills | Status: DC

## 2020-06-27 NOTE — Telephone Encounter (Signed)
Gave patient [employer] [place] assistance paperwork to fill out to possibly assist with helping to afford Trelegy inhaler. Explained paperwork to patient, patient expressed full understanding and stated she will drop paperwork back off at office once filled out.

## 2020-06-27 NOTE — Progress Notes (Signed)
[facility] OFFICE PROGRESS NOTE  [name] Squibb, MD [address] [place] [address]  DIAGNOSIS: Extensive stage (T3, N2, M1c) small cell lung cancer presented with right middle lobe lung mass in addition to right hilar and low right paratracheal adenopathy in addition to tiny solitary brain metastasis diagnosed in August 2021.  PRIOR THERAPY: None   CURRENT THERAPY: Palliative systemic chemotherapy with carboplatin for AUC of 5 on day 1, etoposide 100 mg/M2 on days 1, 2 and 3 with Neulasta support. First dose on 06/07/20. Concurrent radiation under the care of Dr. [name], last dose expected on 08/08/20  INTERVAL HISTORY: [name] 69 y.o. female returns to the clinic today for a follow up visit. The patient is feeling well today without any concerning complaints. The patient tolerated her first treatment with carboplatin and etoposide well without any adverse effects except for fatigue for a few days following her treatment. She is receiving radiation under the care of Dr. [name]. Her last day of radiation is scheduled for 08/08/20. She reports baseline night sweats which have been occurring for several years. Denies any fever, chills, or recent weight loss. She states she had rapidly lost weight since March after being treated for pneumonia. She states her baseline weight was 235 lbs. She states that currently she is "eating like a pig" and has a good appetite. Denies any chest pain, shortness of breath, cough, or hemoptysis. Denies any nausea, vomiting, or constipation. She states she has chronic diarrhea but recently had her medications changed which has helped her diarrhea. Denies any headache or visual changes. She was recently found to have a small solitary brain metastasis. The patient is here today for evaluation prior to starting cycle # 2   MEDICAL HISTORY: Past Medical History:  Diagnosis Date  . Allergic rhinitis   . 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 Anemia   . Anxiety   . ASCVD (arteriosclerotic cardiovascular disease)    MI in 96 requiring BMS CX; DES to M1 in 2000;normal coronary angiography in 2004  . Cholelithiasis   . COPD (chronic obstructive pulmonary disease) ([place])   . DDD (degenerative disc disease), lumbar   . Depression   . Diabetes mellitus    Type II  . Diarrhea   . DVT (deep venous thrombosis) ([place])    patient said no  . GERD (gastroesophageal reflux disease)   . Headache    migraine  . History of kidney stones    2006  . Hyperlipidemia   . Hypotension   . Hypothyroidism   . Low back pain   . Myocardial infarction ([place]) 01/1995  . Nephrolithiasis 2006   stone extraction   . Panic attacks   . Peripheral neuropathy   . Peripheral vascular disease ([facility])    legs  . Pneumonia 12/22/2019  . Sciatic pain    right  . Tobacco abuse   . Tremor     ALLERGIES:  is allergic to iohexol, tape, ciprofloxacin, latex, penicillins, and povidone-iodine.  MEDICATIONS:  Current Outpatient Medications  Medication Sig Dispense Refill  . albuterol (PROVENTIL HFA;VENTOLIN HFA) 108 (90 BASE) MCG/ACT inhaler Inhale 2 puffs into the lungs every 4 (four) hours as needed for wheezing or shortness of breath. 1 Inhaler 0  . aspirin-acetaminophen-caffeine (EXCEDRIN MIGRAINE) 250-250-65 MG tablet Take 2 tablets by mouth daily as needed for headache. Up to 4 times a week    . azelastine (OPTIVAR) 0.05 % ophthalmic  solution Place 1 drop into both eyes 2 (two) times daily.    Marland Kitchen b complex vitamins tablet Take 1 tablet by mouth daily. With Zinc and vitamin C    . Benzocaine (BOIL-EASE EX) Apply 1 application topically daily as needed (Boil).    . Biotin 5000 MCG TABS Take 10,000 mcg by mouth daily. Keratin    . carbamide peroxide (DEBROX) 6.5 % OTIC solution 5 drops daily as needed.    . Cholecalciferol (VITAMIN D3) 50 MCG (2000 UT) TABS Take 4,000 Units by mouth daily.    . colestipol (COLESTID) 5 g packet Take 3 g by mouth  daily. Do not take within 2 hours of other medications. 18 packet 0  . Cyanocobalamin 2500 MCG CHEW Chew 2,500 mcg by mouth daily.    . diphenhydrAMINE (BENADRYL) 50 MG capsule Take 49m 1 hour before scheduled CT (Patient not taking: Reported on 06/15/2020) 1 capsule 0  . ferrous sulfate 325 (65 FE) MG tablet Take 325 mg by mouth daily with breakfast.    . Fluticasone-Umeclidin-Vilant (TRELEGY ELLIPTA) 100-62.5-25 MCG/INH AEPB Inhale 1 puff into the lungs daily. 28 each 5  . Fluticasone-Umeclidin-Vilant (TRELEGY ELLIPTA) 100-62.5-25 MCG/INH AEPB Inhale 1 puff into the lungs daily. 60 each 0  . Fluticasone-Umeclidin-Vilant (TRELEGY ELLIPTA) 100-62.5-25 MCG/INH AEPB Inhale 1 puff into the lungs daily. 60 each 3  . furosemide (LASIX) 40 MG tablet Take 1 tablet (40 mg total) by mouth 2 (two) times daily. (Patient taking differently: Take 40 mg by mouth daily. ) 30 tablet 1  . Ginkgo Biloba Extract (GNP GINGKO BILOBA EXTRACT) 60 MG CAPS Take 2 capsules by mouth daily.     . hydrocortisone cream 1 % Apply 1 application topically daily as needed for itching.    .Marland Kitchenibuprofen (ADVIL) 200 MG tablet Take 200-600 mg by mouth every 6 (six) hours as needed for moderate pain.    .Marland KitchenLANTUS SOLOSTAR 100 UNIT/ML Solostar Pen Inject 16 Units into the skin daily.     .Marland Kitchenlevothyroxine (SYNTHROID) 100 MCG tablet Take 100 mcg by mouth daily before breakfast.    . lisinopril (ZESTRIL) 20 MG tablet Take 20 mg by mouth at bedtime.    .Marland KitchenLORazepam (ATIVAN) 0.5 MG tablet Take 1 tablet 30 minutes before the MRI.  Repeat once if needed. (Patient not taking: Reported on 06/15/2020) 2 tablet 0  . Menthol, Topical Analgesic, (ICY HOT) 7.5 % (Roll) MISC Apply 1 each topically daily as needed (Pain). spray     . metFORMIN (GLUCOPHAGE) 500 MG tablet Take 500 mg by mouth 2 (two) times daily with a meal.     . Multiple Vitamins-Minerals (MULTIVITAMIN WITH MINERALS) tablet Take 1 tablet by mouth daily. Adult 50+    .  neomycin-bacitracin-polymyxin (NEOSPORIN) OINT Apply 1 application topically daily as needed for irritation or wound care.    . Omega 3-6-9 Fatty Acids (OMEGA 3-6-9 COMPLEX PO) Take 2 tablets by mouth daily.    .Marland KitchenoxyCODONE-acetaminophen (PERCOCET/ROXICET) 5-325 MG tablet Take 0.5 tablets by mouth 2 (two) times daily as needed for severe pain.    . pantoprazole (PROTONIX) 40 MG tablet Take 40 mg by mouth daily.    .Marland KitchenPARoxetine (PAXIL) 20 MG tablet Take 20 mg by mouth daily.     . predniSONE (DELTASONE) 50 MG tablet Take 50 mg 13 hours, 7 hours, and 2 hours before scheduled CT scan 3 tablet 0  . prochlorperazine (COMPAZINE) 10 MG tablet Take 1 tablet (10 mg total) by mouth every  6 (six) hours as needed. 30 tablet 2  . pyridOXINE (VITAMIN B-6) 50 MG tablet Take 200 mg by mouth daily.    . rosuvastatin (CRESTOR) 10 MG tablet Take 1 tablet (10 mg total) by mouth daily. 90 tablet 3  . traZODone (DESYREL) 150 MG tablet Take 150 mg by mouth at bedtime.      No current facility-administered medications for this visit.   Facility-Administered Medications Ordered in Other Visits  Medication Dose Route Frequency Provider Last Rate Last Admin  . CARBOplatin (PARAPLATIN) 370 mg in sodium chloride 0.9 % 250 mL chemo infusion  370 mg Intravenous Once Curt Bears, MD      . etoposide (VEPESID) 130 mg in sodium chloride 0.9 % 500 mL chemo infusion  75 mg/m2 (Treatment Plan Recorded) Intravenous Once Curt Bears, MD      . fosaprepitant (EMEND) 150 mg in sodium chloride 0.9 % 145 mL IVPB  150 mg Intravenous Once Curt Bears, MD 450 mL/hr at 06/28/20 1351 150 mg at 06/28/20 1351    SURGICAL HISTORY:  Past Surgical History:  Procedure Laterality Date  . AGILE CAPSULE N/A 03/07/2020   Procedure: AGILE CAPSULE;  Surgeon: Daneil Dolin, MD;  [facility];  Service: Endoscopy;  Laterality: N/A;  7:30am  . BACK SURGERY     fusion  . BRONCHIAL NEEDLE ASPIRATION BIOPSY N/A 05/20/2020    Procedure: BRONCHIAL NEEDLE ASPIRATION BIOPSIES;  Surgeon: Collene Gobble, MD;  Location: [facility];  Service: Pulmonary;  Laterality: N/A;  . CHOLECYSTECTOMY    . COLONOSCOPY  01/2009   XVQ:[id number] rectum/repeat in 5 yrs  . COLONOSCOPY N/A 04/29/2014   Dr.Rourk- attempted/incomplete colonoscopy. inadequate prep  . COLONOSCOPY N/A 05/27/2014   PPJ:[id number] coli. Colonic polyps-removed as described above.Status post segmental biopsy. single tubular adenoma and random colon bx neg  . COLONOSCOPY WITH PROPOFOL N/A 12/23/2019   Procedure: COLONOSCOPY WITH PROPOFOL;  Surgeon: Ronnette Juniper, MD;  Location: [place];  Service: Gastroenterology;  Laterality: N/A;  . DILATION AND CURETTAGE OF UTERUS  1974  . ESOPHAGOGASTRODUODENOSCOPY  05/2010   Dr. Tessie Fass, erosion. 8F dilation  . ESOPHAGOGASTRODUODENOSCOPY N/A 04/29/2014   Dr.Rourk- normal esophagus s/p passage of maloney dilator. small hiatal hernia- bx= chronic inflammation.  . ESOPHAGOGASTRODUODENOSCOPY (EGD) WITH PROPOFOL N/A 12/23/2019   Procedure: ESOPHAGOGASTRODUODENOSCOPY (EGD) WITH PROPOFOL;  Surgeon: Ronnette Juniper, MD;  Location: [place];  Service: Gastroenterology;  Laterality: N/A;  . GIVENS CAPSULE STUDY N/A 03/22/2020   Procedure: GIVENS CAPSULE STUDY;  Surgeon: Daneil Dolin, MD;  [facility];  Service: Endoscopy;  Laterality: N/A;  7:30am  . HEMOSTASIS CLIP PLACEMENT  12/23/2019   Procedure: HEMOSTASIS CLIP PLACEMENT;  Surgeon: Ronnette Juniper, MD;  Location: [place];  Service: Gastroenterology;;  . HOT HEMOSTASIS N/A 12/23/2019   Procedure: HOT HEMOSTASIS (ARGON PLASMA COAGULATION/BICAP);  Surgeon: Ronnette Juniper, MD;  Location: [place];  Service: Gastroenterology;  Laterality: N/A;  . LEFT HEART CATH AND CORONARY ANGIOGRAPHY N/A 12/24/2019   Procedure: LEFT HEART CATH AND CORONARY ANGIOGRAPHY;  Surgeon: Nigel Mormon, MD;  Location: [facility];  Service: Cardiovascular;  Laterality: N/A;  . Venia Minks  DILATION N/A 04/29/2014   Procedure: Venia Minks DILATION;  Surgeon: Daneil Dolin, MD;  [facility];  Service: Endoscopy;  Laterality: N/A;  . PARTIAL HYSTERECTOMY  1978  . POLYPECTOMY  12/23/2019   Procedure: POLYPECTOMY;  Surgeon: Ronnette Juniper, MD;  Location: [place];  Service: Gastroenterology;;  . SHOULDER SURGERY Left  Left shoulder for RTC;left arm surgery '98/left hand surgery 2001  . TOTAL ABDOMINAL HYSTERECTOMY W/ BILATERAL SALPINGOOPHORECTOMY  2002  . UMBILICAL HERNIA REPAIR  2008  . VIDEO BRONCHOSCOPY WITH ENDOBRONCHIAL ULTRASOUND N/A 05/20/2020   Procedure: VIDEO BRONCHOSCOPY WITH ENDOBRONCHIAL ULTRASOUND;  Surgeon: Collene Gobble, MD;  Location[facility];  Service: Pulmonary;  Laterality: N/A;    REVIEW OF SYSTEMS:   Review of Systems  Constitutional: Positive for fatigue. Negative for appetite change, chills, fever and unexpected weight change.  HENT: Negative for mouth sores, nosebleeds, sore throat and trouble swallowing.   Eyes: Negative for eye problems and icterus.  Respiratory: Negative for cough, hemoptysis, shortness of breath and wheezing.   Cardiovascular: Negative for chest pain and leg swelling.  Gastrointestinal: Positive for chronic diarrhea. Negative for abdominal pain, constipation,  nausea and vomiting.  Genitourinary: Negative for bladder incontinence, difficulty urinating, dysuria, frequency and hematuria.   Musculoskeletal: Negative for back pain, gait problem, neck pain and neck stiffness.  Skin: Negative for itching and rash.  Neurological: Negative for dizziness, extremity weakness, gait problem, headaches, light-headedness and seizures.  Hematological: Negative for adenopathy. Does not bruise/bleed easily.  Psychiatric/Behavioral: Negative for confusion, depression and sleep disturbance. The patient is not nervous/anxious.     PHYSICAL EXAMINATION:  Blood pressure (!) 131/58, pulse 63, temperature 97.9 F (36.6 C), temperature  source Tympanic, resp. rate 18, height 5' 6"  (1.676 m), weight 147 lb 9.6 oz (67 kg), SpO2 98 %.  ECOG PERFORMANCE STATUS: 1 - Symptomatic but completely ambulatory  Physical Exam  Constitutional: Oriented to person, place, and time and well-developed, well-nourished, and in no distress.  HENT:  Head: Normocephalic and atraumatic.  Mouth/Throat: Oropharynx is clear and moist. No oropharyngeal exudate.  Eyes: Conjunctivae are normal. Right eye exhibits no discharge. Left eye exhibits no discharge. No scleral icterus.  Neck: Normal range of motion. Neck supple.  Cardiovascular: Normal rate, regular rhythm, normal heart sounds and intact distal pulses.   Pulmonary/Chest: Effort normal. Mild wheezing. No respiratory distress.  No rales.  Abdominal: Soft. Bowel sounds are normal. Exhibits no distension and no mass. There is no tenderness.  Musculoskeletal: Absent right hand. Normal range of motion. Exhibits no edema.  Lymphadenopathy:    No cervical adenopathy.  Neurological: Alert and oriented to person, place, and time. Exhibits normal muscle tone. Gait normal. Uses a walker for ambulation.   Skin: Skin is warm and dry. No rash noted. Not diaphoretic. No erythema. No pallor.  Psychiatric: Mood, memory and judgment normal.  Vitals reviewed.  LABORATORY DATA: Lab Results  Component Value Date   WBC 9.4 06/28/2020   HGB 8.5 (L) 06/28/2020   HCT 27.1 (L) 06/28/2020   MCV 96.1 06/28/2020   PLT 222 06/28/2020      Chemistry      Component Value Date/Time   NA 136 06/28/2020 1207   NA 138 05/25/2020 0806   K 4.6 06/28/2020 1207   CL 103 06/28/2020 1207   CO2 27 06/28/2020 1207   BUN 16 06/28/2020 1207   BUN 26 05/25/2020 0806   CREATININE 1.15 (H) 06/28/2020 1207   CREATININE 0.85 05/03/2012 0942      Component Value Date/Time   CALCIUM 9.4 06/28/2020 1207   ALKPHOS 91 06/28/2020 1207   AST 23 06/28/2020 1207   ALT 18 06/28/2020 1207   BILITOT 0.3 06/28/2020 1207        RADIOGRAPHIC STUDIES:  MR Brain W Wo Contrast  Result Date: 06/09/2020 CLINICAL DATA:  Small  cell lung cancer staging EXAM: MRI HEAD WITHOUT AND WITH CONTRAST TECHNIQUE: Multiplanar, multiecho pulse sequences of the brain and surrounding structures were obtained without and with intravenous contrast. CONTRAST:  16m GADAVIST GADOBUTROL 1 MMOL/ML IV SOLN COMPARISON:  09/05/2014 brain MRI FINDINGS: Brain: Enhancing nodular lesion in the midline cerebellum measuring 4 mm on coronal postcontrast imaging, with mild adjacent vasogenic edema. Subtle nodular densities marked on axial slices more laterally in the cerebellum are not seen on coronal acquisition and attributed to artifact. No incidental infarct, hemorrhage, hydrocephalus, or collection. Mild chronic small vessel disease in the hemispheric white matter. Vascular: Normal flow voids and vascular enhancement Skull and upper cervical spine: No evidence of bony metastasis Sinuses/Orbits: Negative for mass. IMPRESSION: Solitary 4 mm metastasis in the midline cerebellum with mild vasogenic edema. Electronically Signed   By: JMonte FantasiaM.D.   On: 06/09/2020 07:41   CT Angio Abd/Pel w/ and/or w/o  Result Date: 06/09/2020 CLINICAL DATA:  Mesenteric ischemia, weight loss, abdominal pain. History of small cell lung carcinoma post chemotherapy. EXAM: CTA ABDOMEN AND PELVIS WITH CONTRAST TECHNIQUE: Multidetector CT imaging of the abdomen and pelvis was performed using the standard protocol during bolus administration of intravenous contrast. Multiplanar reconstructed images and MIPs were obtained and reviewed to evaluate the vascular anatomy. CONTRAST:  832mOMNIPAQUE IOHEXOL 350 MG/ML SOLN COMPARISON:  05/02/2020 and previous FINDINGS: VASCULAR Aorta: Moderate calcified atheromatous plaque. No high-grade stenosis. No aneurysm or dissection. 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 Celiac: Patent without evidence of aneurysm,  dissection, vasculitis or significant stenosis. SMA: Patent without evidence of aneurysm, dissection, vasculitis or significant stenosis. Renals: Single left, with irregular plaque extending from the ostium a long length of at least 2.1 cm resulting in at least moderate stenosis. Single right renal artery, with calcified ostial plaque along the length of at least 1.5 cm resulting in at least mild stenosis, patent distally. IMA: Short-segment origin occlusion or stenosis, reconstituted distally by visceral collaterals. Inflow: Scattered calcified nonocclusive plaque. No aneurysm or dissection. Proximal Outflow: Atheromatous, patent Veins: Patent hepatic veins, portal vein, SMV, splenic vein, bilateral renal veins. Unremarkable iliac venous system and IVC. No venous pathology evident. Review of the MIP images confirms the above findings. NON-VASCULAR Lower chest: Right mid lung mass, incompletely visualized. Coronary calcifications. No pleural or pericardial effusion. Hepatobiliary: Nodular liver contour without focal lesion or biliary ductal dilatation. Cholecystectomy clips. Pancreas: Unremarkable. No pancreatic ductal dilatation or surrounding inflammatory changes. Spleen: Normal in size without focal abnormality. Adrenals/Urinary Tract: Adrenal glands unremarkable. Symmetric renal enhancement. No hydronephrosis. Urinary bladder incompletely distended. Stomach/Bowel: Stomach is nondistended. Small-bowel decompressed. Normal appendix. The colon is nondilated, unremarkable. Lymphatic: No abdominal or pelvic adenopathy. Stable subcentimeter left para-aortic and aortocaval mesenteric lymph nodes since 10/30/2013. Reproductive: Status post hysterectomy. No adnexal masses. Other: Right pelvic phleboliths.  No ascites.  No free air. Musculoskeletal: Postop and degenerative changes in the lower lumbar spine. 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 No fracture or worrisome bone lesion. IMPRESSION: 1. No  significant proximal mesenteric arterial occlusive disease to suggest an etiology of mesenteric ischemia. 2. Bilateral renal artery ostial stenoses. 3. Right mid lung mass, incompletely visualized. 4. Coronary and aortic Atherosclerosis (ICD10-I70.0). 5. Cirrhosis. 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 Electronically Signed   By: D Lucrezia Europe.D.   On: 06/09/2020 11:03     ASSESSMENT/PLAN:  This is a very pleasant [age]ear old Caucasian female recently diagnosed with extensive stage small cell lung cancer (T3,  N2, M1 C) presented with right middle lobe lung mass in addition to right hilar and low right paratracheal lymphadenopathy in addition to tiny solitary brain metastasis diagnosed in August 2021.  The patient is currently undergoing systemic chemotherapy with carboplatin for an AUC of 5 on day 1 and etoposide 100 mg/m2 on days 1, 2, and 3 IV every 3 weeks with neulasta support. She is status post 1 cycle and tolerated it well except for fatigue.   She is also receiving radiation under the care of Dr. [name]. Her last day of radiation is scheduled for 08/08/20.   The patient was seen with Dr. [name]. Labs were reviewed. Recommend that she proceed with cycle #2 today as scheduled.   I will arrange for a restaging CT scan of the chest prior to her next cycle of treatment. I have ordered this with contrast due to improved renal function but have written a note that contrast may be held if her GFR that week is <45. I have sent a prescription to her pharmacy for her prednisone pre-medication prior to her scan due to her contrast allergy. She will take 1 tablet 13 hours, 7 hours, and 2 hours before her scan. She was instructed to take benadryl 25 mg 2 hours before her scan. The patient has dementia; therefore, she was given a printout of written instructions.   We will see her back for a follow up visit in 3 weeks for evaluation and to review her scan before starting cycle #3.    She has opted to have her weekly labs performed at our clinic since she comes daily for radiation anyway. I have sent a scheduling message to reschedule her weekly labs closer to her radiation appointment times since she lives far away. Her Hgb is 8.5 today. She does not qualify for a blood transfusion now but may require one in the near future if her Hgb <8. I have added a sample to blood bank for the next two weeks.   The patient was advised to call immediately if she has any concerning symptoms in the interval. The patient voices understanding of current disease status and treatment options and is in agreement with the current care plan. All questions were answered. The patient knows to call the clinic with any problems, questions or concerns. We can certainly see the patient much sooner if necessary    Orders Placed This Encounter  Procedures  . CT Chest W Contrast    May hold contrast if GFR <45    Standing Status:   Future    Standing Expiration Date:   06/28/2021    Order Specific Question:   If indicated for the ordered procedure, I authorize the administration of contrast media per Radiology protocol    Answer:   Yes    Order Specific Question:   Preferred imaging location?    Answer:   [facility]    Order Specific Question:   Radiology Contrast Protocol - do NOT remove file path    Answer:   [email or web address]  . Sample to Blood Bank    Standing Status:   Standing    Number of Occurrences:   2    Standing Expiration Date:   06/28/2021     Tobe Sos Zaria Taha, PA-C 06/28/20  ADDENDUM: Hematology/Oncology Attending: I had a face-to-face encounter with the patient today.  I recommended her care plan.  This is a very pleasant 69 years old white female recently diagnosed with extensive stage small  cell lung cancer presented with right middle lobe lung mass in addition to right hilar and low right paratracheal lymph node as  well as tiny solitary brain metastasis diagnosed in August 2021. The patient is currently undergoing systemic chemotherapy with carboplatin and etoposide status post 1 cycle.  She tolerated the first cycle of her treatment fairly well with no concerning adverse effect except for 1 episode of nausea improved with Compazine. She is feeling much better today and I recommended for her to proceed with cycle #2 of her treatment today as planned. She will come back for follow-up visit in 3 weeks for evaluation with repeat CT scan of the chest for restaging of her disease. We will continue to monitor the tiny solitary brain lesion closely and consider the patient for repeat MRI after completion of cycle #4. She was advised to call immediately if she has any concerning symptoms in the interval.  Disclaimer: This note was dictated with voice recognition software. Similar sounding words can inadvertently be transcribed and may be missed upon review. Eilleen Kempf, MD 06/28/20

## 2020-06-27 NOTE — Telephone Encounter (Signed)
Patient stopped in to get some Trelegy 100 mcg samples stating she couldn't afford refill at pharmacy currently. Dr [name] aware and two samples of Trelegy 148mg ordered and given to patient per Dr SHalford Chessman

## 2020-06-28 ENCOUNTER — Ambulatory Visit
Admission: RE | Admit: 2020-06-28 | Discharge: 2020-06-28 | Disposition: A | Discharge status: Home / Self Care | Payer: Medicare HMO | Source: Ambulatory Visit | Attending: Radiation Oncology | Admitting: Radiation Oncology

## 2020-06-28 ENCOUNTER — Other Ambulatory Visit: Payer: Self-pay

## 2020-06-28 ENCOUNTER — Encounter: Payer: Self-pay | Admitting: Physician Assistant

## 2020-06-28 ENCOUNTER — Inpatient Hospital Stay: Payer: Medicare HMO

## 2020-06-28 ENCOUNTER — Inpatient Hospital Stay (HOSPITAL_BASED_OUTPATIENT_CLINIC_OR_DEPARTMENT_OTHER): Payer: Medicare HMO | Admitting: Physician Assistant

## 2020-06-28 ENCOUNTER — Telehealth: Payer: Self-pay

## 2020-06-28 VITALS — BP 131/58 | HR 63 | Temp 97.9°F | Resp 18 | Ht 66.0 in | Wt 147.6 lb

## 2020-06-28 DIAGNOSIS — Z5111 Encounter for antineoplastic chemotherapy: Secondary | ICD-10-CM | POA: Diagnosis not present

## 2020-06-28 DIAGNOSIS — C342 Malignant neoplasm of middle lobe, bronchus or lung: Secondary | ICD-10-CM

## 2020-06-28 DIAGNOSIS — C7931 Secondary malignant neoplasm of brain: Secondary | ICD-10-CM | POA: Diagnosis present

## 2020-06-28 DIAGNOSIS — Z5189 Encounter for other specified aftercare: Secondary | ICD-10-CM | POA: Diagnosis not present

## 2020-06-28 DIAGNOSIS — Z510 Encounter for antineoplastic radiation therapy: Secondary | ICD-10-CM | POA: Diagnosis not present

## 2020-06-28 LAB — CMP (CANCER CENTER ONLY)
ALT: 18 U/L (ref 0–44)
AST: 23 U/L (ref 15–41)
Albumin: 3.5 g/dL (ref 3.5–5.0)
Alkaline Phosphatase: 91 U/L (ref 38–126)
Anion gap: 6 (ref 5–15)
BUN: 16 mg/dL (ref 8–23)
CO2: 27 mmol/L (ref 22–32)
Calcium: 9.4 mg/dL (ref 8.9–10.3)
Chloride: 103 mmol/L (ref 98–111)
Creatinine: 1.15 mg/dL — ABNORMAL HIGH (ref 0.44–1.00)
GFR, Est AFR Am: 57 mL/min — ABNORMAL LOW (ref >60)
GFR, Estimated: 49 mL/min — ABNORMAL LOW (ref >60)
Glucose, Bld: 75 mg/dL (ref 70–99)
Potassium: 4.6 mmol/L (ref 3.5–5.1)
Sodium: 136 mmol/L (ref 135–145)
Total Bilirubin: 0.3 mg/dL (ref 0.3–1.2)
Total Protein: 7 g/dL (ref 6.5–8.1)

## 2020-06-28 LAB — CBC WITH DIFFERENTIAL (CANCER CENTER ONLY)
Abs Immature Granulocytes: 0.06 10*3/uL (ref 0.00–0.07)
Basophils Absolute: 0 10*3/uL (ref 0.0–0.1)
Basophils Relative: 0 %
Eosinophils Absolute: 0 10*3/uL (ref 0.0–0.5)
Eosinophils Relative: 0 %
HCT: 27.1 % — ABNORMAL LOW (ref 36.0–46.0)
Hemoglobin: 8.5 g/dL — ABNORMAL LOW (ref 12.0–15.0)
Immature Granulocytes: 1 %
Lymphocytes Relative: 23 %
Lymphs Abs: 2.2 10*3/uL (ref 0.7–4.0)
MCH: 30.1 pg (ref 26.0–34.0)
MCHC: 31.4 g/dL (ref 30.0–36.0)
MCV: 96.1 fL (ref 80.0–100.0)
Monocytes Absolute: 0.7 10*3/uL (ref 0.1–1.0)
Monocytes Relative: 7 %
Neutro Abs: 6.4 10*3/uL (ref 1.7–7.7)
Neutrophils Relative %: 69 %
Platelet Count: 222 10*3/uL (ref 150–400)
RBC: 2.82 MIL/uL — ABNORMAL LOW (ref 3.87–5.11)
RDW: 17.3 % — ABNORMAL HIGH (ref 11.5–15.5)
WBC Count: 9.4 10*3/uL (ref 4.0–10.5)
nRBC: 0 % (ref 0.0–0.2)

## 2020-06-28 MED ORDER — SODIUM CHLORIDE 0.9 % IV SOLN
10.0000 mg | Freq: Once | INTRAVENOUS | Status: AC
  Administered 2020-06-28: 10 mg via INTRAVENOUS
  Filled 2020-06-28: qty 10

## 2020-06-28 MED ORDER — SODIUM CHLORIDE 0.9 % IV SOLN
150.0000 mg | Freq: Once | INTRAVENOUS | Status: AC
  Administered 2020-06-28: 150 mg via INTRAVENOUS
  Filled 2020-06-28: qty 150

## 2020-06-28 MED ORDER — SODIUM CHLORIDE 0.9 % IV SOLN
Freq: Once | INTRAVENOUS | Status: AC
  Filled 2020-06-28: qty 250

## 2020-06-28 MED ORDER — PREDNISONE 50 MG PO TABS: ORAL_TABLET | ORAL | 0 refills | Status: DC

## 2020-06-28 MED ORDER — PALONOSETRON HCL INJECTION 0.25 MG/5ML
0.2500 mg | Freq: Once | INTRAVENOUS | Status: AC
  Administered 2020-06-28: 0.25 mg via INTRAVENOUS

## 2020-06-28 MED ORDER — SODIUM CHLORIDE 0.9 % IV SOLN
370.0000 mg | Freq: Once | INTRAVENOUS | Status: AC
  Administered 2020-06-28: 370 mg via INTRAVENOUS
  Filled 2020-06-28: qty 37

## 2020-06-28 MED ORDER — SODIUM CHLORIDE 0.9 % IV SOLN
75.0000 mg/m2 | Freq: Once | INTRAVENOUS | Status: AC
  Administered 2020-06-28: 130 mg via INTRAVENOUS
  Filled 2020-06-28: qty 6.5

## 2020-06-28 MED ORDER — TRELEGY ELLIPTA 100-62.5-25 MCG/INH IN AEPB: 1.0000 | INHALATION_SPRAY | Freq: Every day | RESPIRATORY_TRACT | 3 refills | Status: DC

## 2020-06-28 MED ORDER — PALONOSETRON HCL INJECTION 0.25 MG/5ML
INTRAVENOUS | Status: AC
  Filled 2020-06-28: qty 5

## 2020-06-28 NOTE — Progress Notes (Signed)
MD [name] would like for pharmacy to adjust dose of Carboplatin based on improved renal function. Dose calculated to be 370 mg.   Larene Beach, PharmD

## 2020-06-28 NOTE — Patient Instructions (Signed)
[facility] Discharge Instructions for Patients Receiving Chemotherapy  Today you received the following chemotherapy agents Carboplatin (PARAPLATIN) & Etoposide (VEPESID).  To help prevent nausea and vomiting after your treatment, we encourage you to take your nausea medication as prescribed.   If you develop nausea and vomiting that is not controlled by your nausea medication, call the clinic.   BELOW ARE SYMPTOMS THAT SHOULD BE REPORTED IMMEDIATELY:  *FEVER GREATER THAN 100.5 F  *CHILLS WITH OR WITHOUT FEVER  NAUSEA AND VOMITING THAT IS NOT CONTROLLED WITH YOUR NAUSEA MEDICATION  *UNUSUAL SHORTNESS OF BREATH  *UNUSUAL BRUISING OR BLEEDING  TENDERNESS IN MOUTH AND THROAT WITH OR WITHOUT PRESENCE OF ULCERS  *URINARY PROBLEMS  *BOWEL PROBLEMS  UNUSUAL RASH Items with * indicate a potential emergency and should be followed up as soon as possible.  Feel free to call the clinic should you have any questions or concerns. The clinic phone number is [phone].  Please show the [facility] at check-in to the Emergency Department and triage nurse.

## 2020-06-28 NOTE — Progress Notes (Signed)
trele

## 2020-06-28 NOTE — Patient Instructions (Addendum)
-  It was nice meeting you today. I am Dr. Worthy Flank Physician Assistant. Sometimes when you have appointments you may see just me, just Dr. [name], or both of [place] - Before we see you for your next cycle of treatment in 3 weeks (October 5th, see appointments below), we would like to do a CT scan of the chest to check on the progression of treatment. I would like your scan to be done around 07/14/20 or 07/15/20. That way, we will have the results by the time we see you for your next appointment. They should call you from the radiology department to schedule your scan so please make sure to call them back if you miss a call from them. If they do not call you or it is approaching your 07/19/20 appointment and you still have not heard from radiology scheduling, just go ahead and give them a call to schedule your scan. Their number is [phone]. All you need to tell them is your name and date of birth and that you are calling to schedule your CT scan. They should be able to pull your chart open with that information and see the order for your scan and schedule it from there. I know you live far away, I have scheduled scans at [facility] in the past; however, they have a lot less availability and often cannot schedule scans in the time frame that we need before your next appointment. I have better luck here at [facility].  -As long as you are getting your weekly labs in [place], you do not need to get them done here weekly. Please just make sure [employer] is faxing them to [place]. I have cancelled the scheduled lab appointments here except for the ones you get on days you get treatment.  -Your blood work looks good today except your hemoglobin is trending downward. Hemoglobin/RBCs carry oxygen around. Sometimes if this gets too low you may feel extra tired or short of breath. This is an expected side effect from the treatment. We often will consider people for a blood transfusion if the hemoglobin drops below  8. You are getting close to 8 which is why it is important to get your blood work performed as planned weekly just so we can keep a close eye on this and arrange a blood transfusion if needed.  -I am glad you are doing well with this treatment. If you have any questions or concerns, please always feel free to call us.

## 2020-06-28 NOTE — Telephone Encounter (Signed)
Left voicemail for pt to call back [facility]

## 2020-06-29 ENCOUNTER — Encounter: Payer: Self-pay | Admitting: *Deleted

## 2020-06-29 ENCOUNTER — Other Ambulatory Visit: Payer: Self-pay

## 2020-06-29 ENCOUNTER — Inpatient Hospital Stay: Payer: Medicare HMO

## 2020-06-29 ENCOUNTER — Ambulatory Visit: Payer: Medicare HMO | Admitting: Radiation Oncology

## 2020-06-29 ENCOUNTER — Ambulatory Visit
Admission: RE | Admit: 2020-06-29 | Discharge: 2020-06-29 | Disposition: A | Discharge status: Home / Self Care | Payer: Medicare HMO | Source: Ambulatory Visit | Attending: Radiation Oncology | Admitting: Radiation Oncology

## 2020-06-29 VITALS — BP 108/64 | HR 75 | Temp 98.6°F | Resp 18

## 2020-06-29 DIAGNOSIS — C342 Malignant neoplasm of middle lobe, bronchus or lung: Secondary | ICD-10-CM

## 2020-06-29 DIAGNOSIS — Z5111 Encounter for antineoplastic chemotherapy: Secondary | ICD-10-CM | POA: Diagnosis not present

## 2020-06-29 DIAGNOSIS — Z510 Encounter for antineoplastic radiation therapy: Secondary | ICD-10-CM | POA: Diagnosis not present

## 2020-06-29 MED ORDER — SODIUM CHLORIDE 0.9 % IV SOLN
75.0000 mg/m2 | Freq: Once | INTRAVENOUS | Status: AC
  Administered 2020-06-29: 130 mg via INTRAVENOUS
  Filled 2020-06-29: qty 6.5

## 2020-06-29 MED ORDER — SODIUM CHLORIDE 0.9 % IV SOLN
10.0000 mg | Freq: Once | INTRAVENOUS | Status: AC
  Administered 2020-06-29: 10 mg via INTRAVENOUS
  Filled 2020-06-29: qty 10

## 2020-06-29 MED ORDER — SODIUM CHLORIDE 0.9 % IV SOLN
Freq: Once | INTRAVENOUS | Status: AC
  Filled 2020-06-29: qty 250

## 2020-06-29 NOTE — Progress Notes (Signed)
Oncology Nurse Navigator Documentation  Oncology Nurse Navigator Flowsheets 06/29/2020  Abnormal Finding Date 12/19/2019  Confirmed Diagnosis Date 05/20/2020  Diagnosis Status Confirmed Diagnosis Complete  Planned Course of Treatment Chemo/Radiation Concurrent  Phase of Treatment Radiation  Chemotherapy Actual Start Date: 06/07/2020  Radiation Actual Start Date: 06/28/2020  Navigator Follow Up Date: 06/28/2020  Navigator Follow Up Reason: Follow-up Appointment;Radiation  Navigator Location [facility]  Navigator Encounter Type Clinic/MDC  Treatment Initiated Date 06/07/2020  Patient Visit Type MedOnc  Treatment Phase Treatment  Barriers/Navigation Needs Education  Education Understanding Cancer/ Treatment Options;Other  Interventions Education;Psycho-Social Support  Acuity Level 2-Minimal Needs (1-2 Barriers Identified)  Education Method Verbal  Time Spent with Patient 15

## 2020-06-29 NOTE — Patient Instructions (Signed)
[place] Discharge Instructions for Patients Receiving Chemotherapy  Today you received the following chemotherapy agents: Etoposide (VEPESID).  To help prevent nausea and vomiting after your treatment, we encourage you to take your nausea medication as prescribed.   If you develop nausea and vomiting that is not controlled by your nausea medication, call the clinic.   BELOW ARE SYMPTOMS THAT SHOULD BE REPORTED IMMEDIATELY:  *FEVER GREATER THAN 100.5 F  *CHILLS WITH OR WITHOUT FEVER  NAUSEA AND VOMITING THAT IS NOT CONTROLLED WITH YOUR NAUSEA MEDICATION  *UNUSUAL SHORTNESS OF BREATH  *UNUSUAL BRUISING OR BLEEDING  TENDERNESS IN MOUTH AND THROAT WITH OR WITHOUT PRESENCE OF ULCERS  *URINARY PROBLEMS  *BOWEL PROBLEMS  UNUSUAL RASH Items with * indicate a potential emergency and should be followed up as soon as possible.  Feel free to call the clinic should you have any questions or concerns. The clinic phone number is [phone].  Please show the [place] at check-in to the Emergency Department and triage nurse.

## 2020-06-30 ENCOUNTER — Ambulatory Visit: Payer: Medicare HMO

## 2020-06-30 ENCOUNTER — Other Ambulatory Visit: Payer: Self-pay

## 2020-06-30 ENCOUNTER — Ambulatory Visit
Admission: RE | Admit: 2020-06-30 | Discharge: 2020-06-30 | Disposition: A | Discharge status: Home / Self Care | Payer: Medicare HMO | Source: Ambulatory Visit | Attending: Radiation Oncology | Admitting: Radiation Oncology

## 2020-06-30 ENCOUNTER — Inpatient Hospital Stay: Payer: Medicare HMO

## 2020-06-30 VITALS — BP 110/52 | HR 66 | Temp 98.5°F | Resp 18

## 2020-06-30 DIAGNOSIS — C342 Malignant neoplasm of middle lobe, bronchus or lung: Secondary | ICD-10-CM

## 2020-06-30 DIAGNOSIS — Z5111 Encounter for antineoplastic chemotherapy: Secondary | ICD-10-CM | POA: Diagnosis not present

## 2020-06-30 DIAGNOSIS — Z510 Encounter for antineoplastic radiation therapy: Secondary | ICD-10-CM | POA: Diagnosis not present

## 2020-06-30 MED ORDER — SODIUM CHLORIDE 0.9 % IV SOLN
75.0000 mg/m2 | Freq: Once | INTRAVENOUS | Status: AC
  Administered 2020-06-30: 130 mg via INTRAVENOUS
  Filled 2020-06-30: qty 6.5

## 2020-06-30 MED ORDER — SODIUM CHLORIDE 0.9 % IV SOLN
Freq: Once | INTRAVENOUS | Status: AC
  Filled 2020-06-30: qty 250

## 2020-06-30 MED ORDER — SODIUM CHLORIDE 0.9 % IV SOLN
10.0000 mg | Freq: Once | INTRAVENOUS | Status: AC
  Administered 2020-06-30: 10 mg via INTRAVENOUS
  Filled 2020-06-30: qty 10

## 2020-06-30 MED ORDER — SODIUM CHLORIDE 0.9% FLUSH
10.0000 mL | INTRAVENOUS | Status: DC | PRN
  Filled 2020-06-30: qty 10

## 2020-06-30 MED ORDER — HEPARIN SOD (PORK) LOCK FLUSH 100 UNIT/ML IV SOLN
500.0000 [IU] | Freq: Once | INTRAVENOUS | Status: DC | PRN
  Filled 2020-06-30: qty 5

## 2020-06-30 NOTE — Patient Instructions (Signed)
[place] Discharge Instructions for Patients Receiving Chemotherapy  Today you received the following chemotherapy agents: Etoposide (VEPESID).  To help prevent nausea and vomiting after your treatment, we encourage you to take your nausea medication as prescribed.   If you develop nausea and vomiting that is not controlled by your nausea medication, call the clinic.   BELOW ARE SYMPTOMS THAT SHOULD BE REPORTED IMMEDIATELY:  *FEVER GREATER THAN 100.5 F  *CHILLS WITH OR WITHOUT FEVER  NAUSEA AND VOMITING THAT IS NOT CONTROLLED WITH YOUR NAUSEA MEDICATION  *UNUSUAL SHORTNESS OF BREATH  *UNUSUAL BRUISING OR BLEEDING  TENDERNESS IN MOUTH AND THROAT WITH OR WITHOUT PRESENCE OF ULCERS  *URINARY PROBLEMS  *BOWEL PROBLEMS  UNUSUAL RASH Items with * indicate a potential emergency and should be followed up as soon as possible.  Feel free to call the clinic should you have any questions or concerns. The clinic phone number is [phone].  Please show the [place] at check-in to the Emergency Department and triage nurse.

## 2020-07-01 ENCOUNTER — Other Ambulatory Visit: Payer: Self-pay

## 2020-07-01 ENCOUNTER — Ambulatory Visit
Admission: RE | Admit: 2020-07-01 | Discharge: 2020-07-01 | Disposition: A | Discharge status: Home / Self Care | Payer: Medicare HMO | Source: Ambulatory Visit | Attending: Radiation Oncology | Admitting: Radiation Oncology

## 2020-07-01 ENCOUNTER — Ambulatory Visit: Payer: Medicare HMO

## 2020-07-01 DIAGNOSIS — Z510 Encounter for antineoplastic radiation therapy: Secondary | ICD-10-CM | POA: Diagnosis not present

## 2020-07-02 ENCOUNTER — Inpatient Hospital Stay: Payer: Medicare HMO

## 2020-07-02 ENCOUNTER — Other Ambulatory Visit: Payer: Self-pay

## 2020-07-02 VITALS — BP 106/57 | HR 83 | Temp 98.3°F | Resp 17 | Ht 66.0 in

## 2020-07-02 DIAGNOSIS — C342 Malignant neoplasm of middle lobe, bronchus or lung: Secondary | ICD-10-CM

## 2020-07-02 DIAGNOSIS — Z5111 Encounter for antineoplastic chemotherapy: Secondary | ICD-10-CM | POA: Diagnosis not present

## 2020-07-02 MED ORDER — PEGFILGRASTIM-JMDB 6 MG/0.6ML ~~LOC~~ SOSY
6.0000 mg | PREFILLED_SYRINGE | Freq: Once | SUBCUTANEOUS | Status: AC
  Administered 2020-07-02: 6 mg via SUBCUTANEOUS

## 2020-07-02 NOTE — Patient Instructions (Signed)

## 2020-07-04 ENCOUNTER — Ambulatory Visit
Admission: RE | Admit: 2020-07-04 | Discharge: 2020-07-04 | Disposition: A | Discharge status: Home / Self Care | Payer: Medicare HMO | Source: Ambulatory Visit | Attending: Radiation Oncology | Admitting: Radiation Oncology

## 2020-07-04 ENCOUNTER — Ambulatory Visit: Payer: Medicare HMO

## 2020-07-04 ENCOUNTER — Other Ambulatory Visit: Payer: Self-pay

## 2020-07-04 DIAGNOSIS — Z510 Encounter for antineoplastic radiation therapy: Secondary | ICD-10-CM | POA: Diagnosis not present

## 2020-07-05 ENCOUNTER — Other Ambulatory Visit: Payer: Self-pay

## 2020-07-05 ENCOUNTER — Other Ambulatory Visit: Payer: Medicare HMO

## 2020-07-05 ENCOUNTER — Encounter: Payer: Self-pay | Admitting: Internal Medicine

## 2020-07-05 ENCOUNTER — Ambulatory Visit
Admission: RE | Admit: 2020-07-05 | Discharge: 2020-07-05 | Disposition: A | Discharge status: Home / Self Care | Payer: Medicare HMO | Source: Ambulatory Visit | Attending: Radiation Oncology | Admitting: Radiation Oncology

## 2020-07-05 ENCOUNTER — Inpatient Hospital Stay: Payer: Medicare HMO

## 2020-07-05 ENCOUNTER — Ambulatory Visit: Payer: Medicare HMO

## 2020-07-05 DIAGNOSIS — Z5111 Encounter for antineoplastic chemotherapy: Secondary | ICD-10-CM | POA: Diagnosis not present

## 2020-07-05 DIAGNOSIS — C342 Malignant neoplasm of middle lobe, bronchus or lung: Secondary | ICD-10-CM

## 2020-07-05 DIAGNOSIS — Z510 Encounter for antineoplastic radiation therapy: Secondary | ICD-10-CM | POA: Diagnosis not present

## 2020-07-05 LAB — CMP (CANCER CENTER ONLY)
ALT: 25 U/L (ref 0–44)
AST: 21 U/L (ref 15–41)
Albumin: 3.5 g/dL (ref 3.5–5.0)
Alkaline Phosphatase: 90 U/L (ref 38–126)
Anion gap: 12 (ref 5–15)
BUN: 27 mg/dL — ABNORMAL HIGH (ref 8–23)
CO2: 24 mmol/L (ref 22–32)
Calcium: 9.7 mg/dL (ref 8.9–10.3)
Chloride: 105 mmol/L (ref 98–111)
Creatinine: 1.12 mg/dL — ABNORMAL HIGH (ref 0.44–1.00)
GFR, Est AFR Am: 58 mL/min — ABNORMAL LOW (ref >60)
GFR, Estimated: 50 mL/min — ABNORMAL LOW (ref >60)
Glucose, Bld: 80 mg/dL (ref 70–99)
Potassium: 4.7 mmol/L (ref 3.5–5.1)
Sodium: 141 mmol/L (ref 135–145)
Total Bilirubin: 0.3 mg/dL (ref 0.3–1.2)
Total Protein: 6.8 g/dL (ref 6.5–8.1)

## 2020-07-05 LAB — CBC WITH DIFFERENTIAL (CANCER CENTER ONLY)
Abs Immature Granulocytes: 0 10*3/uL (ref 0.00–0.07)
Band Neutrophils: 3 %
Basophils Absolute: 0 10*3/uL (ref 0.0–0.1)
Basophils Relative: 0 %
Eosinophils Absolute: 0 10*3/uL (ref 0.0–0.5)
Eosinophils Relative: 0 %
HCT: 25.4 % — ABNORMAL LOW (ref 36.0–46.0)
Hemoglobin: 8 g/dL — ABNORMAL LOW (ref 12.0–15.0)
Lymphocytes Relative: 8 %
Lymphs Abs: 1 10*3/uL (ref 0.7–4.0)
MCH: 30.2 pg (ref 26.0–34.0)
MCHC: 31.5 g/dL (ref 30.0–36.0)
MCV: 95.8 fL (ref 80.0–100.0)
Monocytes Absolute: 0.1 10*3/uL (ref 0.1–1.0)
Monocytes Relative: 1 %
Neutro Abs: 11.5 10*3/uL — ABNORMAL HIGH (ref 1.7–7.7)
Neutrophils Relative %: 88 %
Platelet Count: 250 10*3/uL (ref 150–400)
RBC: 2.65 MIL/uL — ABNORMAL LOW (ref 3.87–5.11)
RDW: 18.7 % — ABNORMAL HIGH (ref 11.5–15.5)
WBC Count: 12.6 10*3/uL — ABNORMAL HIGH (ref 4.0–10.5)
nRBC: 0 % (ref 0.0–0.2)

## 2020-07-05 LAB — SAMPLE TO BLOOD BANK

## 2020-07-05 NOTE — Progress Notes (Signed)
Pt is approved for the $1000 [employer] grant.  

## 2020-07-06 ENCOUNTER — Ambulatory Visit: Payer: Medicare HMO

## 2020-07-06 ENCOUNTER — Other Ambulatory Visit: Payer: Self-pay

## 2020-07-06 ENCOUNTER — Ambulatory Visit
Admission: RE | Admit: 2020-07-06 | Discharge: 2020-07-06 | Disposition: A | Discharge status: Home / Self Care | Payer: Medicare HMO | Source: Ambulatory Visit | Attending: Radiation Oncology | Admitting: Radiation Oncology

## 2020-07-06 DIAGNOSIS — Z510 Encounter for antineoplastic radiation therapy: Secondary | ICD-10-CM | POA: Diagnosis not present

## 2020-07-07 ENCOUNTER — Ambulatory Visit: Payer: Medicare HMO

## 2020-07-07 ENCOUNTER — Ambulatory Visit
Admission: RE | Admit: 2020-07-07 | Discharge: 2020-07-07 | Disposition: A | Discharge status: Home / Self Care | Payer: Medicare HMO | Source: Ambulatory Visit | Attending: Radiation Oncology | Admitting: Radiation Oncology

## 2020-07-07 ENCOUNTER — Other Ambulatory Visit: Payer: Self-pay

## 2020-07-07 DIAGNOSIS — C342 Malignant neoplasm of middle lobe, bronchus or lung: Secondary | ICD-10-CM

## 2020-07-07 DIAGNOSIS — Z510 Encounter for antineoplastic radiation therapy: Secondary | ICD-10-CM | POA: Diagnosis not present

## 2020-07-07 MED ORDER — SONAFINE EX EMUL
1.0000 "application " | Freq: Two times a day (BID) | CUTANEOUS | Status: DC
  Administered 2020-07-07: 1 via TOPICAL

## 2020-07-08 ENCOUNTER — Ambulatory Visit
Admission: RE | Admit: 2020-07-08 | Discharge: 2020-07-08 | Disposition: A | Discharge status: Home / Self Care | Payer: Medicare HMO | Source: Ambulatory Visit | Attending: Radiation Oncology | Admitting: Radiation Oncology

## 2020-07-08 ENCOUNTER — Ambulatory Visit: Payer: Medicare HMO

## 2020-07-08 DIAGNOSIS — Z510 Encounter for antineoplastic radiation therapy: Secondary | ICD-10-CM | POA: Diagnosis not present

## 2020-07-11 ENCOUNTER — Ambulatory Visit
Admission: RE | Admit: 2020-07-11 | Discharge: 2020-07-11 | Disposition: A | Discharge status: Home / Self Care | Payer: Medicare HMO | Source: Ambulatory Visit | Attending: Radiation Oncology | Admitting: Radiation Oncology

## 2020-07-11 ENCOUNTER — Ambulatory Visit: Payer: Medicare HMO

## 2020-07-11 ENCOUNTER — Telehealth: Payer: Self-pay | Admitting: Internal Medicine

## 2020-07-11 ENCOUNTER — Telehealth: Payer: Self-pay | Admitting: Gastroenterology

## 2020-07-11 DIAGNOSIS — Z510 Encounter for antineoplastic radiation therapy: Secondary | ICD-10-CM | POA: Diagnosis not present

## 2020-07-11 MED ORDER — NITROGLYCERIN 0.4 % RE OINT: TOPICAL_OINTMENT | RECTAL | 0 refills | Status: DC

## 2020-07-11 NOTE — Telephone Encounter (Signed)
Spoke with pt. Pt had a hard stool 1 month ago that caused pt to strain. Pt states that her rectum has been sore with pressure every since that BM. Pt hasn't had to strain anymore since she had the hard stool 1 month ago. Pt is currently using Preparation H wipes and cream with no help. Pt says she doesn't feel that she has hemorrhoids present.

## 2020-07-11 NOTE — Telephone Encounter (Signed)
patient called and said that she is having rectal pain and needs to speak to Neil Crouch

## 2020-07-11 NOTE — Telephone Encounter (Signed)
Spoke with [employer] and the cost of Nitroglycerin Ointment is $95.00. the pharmacy couldn't give me the cost of another medication. GoodRX coupon cost is $45-50. Pt would like something less expensive. Please advise.

## 2020-07-11 NOTE — Addendum Note (Signed)
Addended by: Mahala Menghini on: 07/11/2020 11:47 AM   Modules accepted: Orders

## 2020-07-11 NOTE — Telephone Encounter (Signed)
[name], waiting on a return call.  

## 2020-07-11 NOTE — Telephone Encounter (Signed)
The prescription for the suppositories that was called in is almost 100$ and she needs something less expensive

## 2020-07-11 NOTE — Telephone Encounter (Signed)
She may have anal fissure. I have sent in nitroglycerin to apply anorectally every 12 hours for up to 3 weeks. Let her know to used a gloved finger to apply so she does not have absorption of nitroglycerin into skin which would cause a headache.

## 2020-07-11 NOTE — Telephone Encounter (Signed)
Pt was notified of topical cream and how to apply it with a gloved finger. Pt advised to monitor symptoms of a headache.

## 2020-07-11 NOTE — Telephone Encounter (Signed)
I recommend nitroglycerin. I don't think [facility] can compound for less than $45.   We can send in stronger hydrocortisone than she is getting over the counter if she would like but if she has a fissure it may not be as effective as the nitroglycerin.

## 2020-07-12 ENCOUNTER — Ambulatory Visit
Admission: RE | Admit: 2020-07-12 | Discharge: 2020-07-12 | Disposition: A | Discharge status: Home / Self Care | Payer: Medicare HMO | Source: Ambulatory Visit | Attending: Radiation Oncology | Admitting: Radiation Oncology

## 2020-07-12 ENCOUNTER — Other Ambulatory Visit: Payer: Self-pay

## 2020-07-12 ENCOUNTER — Other Ambulatory Visit: Payer: Medicare HMO

## 2020-07-12 ENCOUNTER — Ambulatory Visit: Payer: Medicare HMO

## 2020-07-12 DIAGNOSIS — Z510 Encounter for antineoplastic radiation therapy: Secondary | ICD-10-CM | POA: Diagnosis not present

## 2020-07-12 MED ORDER — HYDROCORTISONE (PERIANAL) 1 % EX CREA: TOPICAL_CREAM | CUTANEOUS | 0 refills | Status: DC

## 2020-07-12 NOTE — Telephone Encounter (Signed)
Pt returned call. Pt isn't able to pay $45.00 and would like to try the higher strength of Hydrocortisone. Please advise.

## 2020-07-12 NOTE — Addendum Note (Signed)
Addended by: Mahala Menghini on: 07/12/2020 01:16 PM   Modules accepted: Orders

## 2020-07-12 NOTE — Telephone Encounter (Signed)
RX sent for hydrocortisone

## 2020-07-12 NOTE — Telephone Encounter (Signed)
Noted. Spoke with pt. Pt is aware that RX was sent to her pharmacy.

## 2020-07-12 NOTE — Telephone Encounter (Signed)
[name], waiting on a return call.  

## 2020-07-13 ENCOUNTER — Ambulatory Visit: Payer: Medicare HMO

## 2020-07-13 ENCOUNTER — Ambulatory Visit
Admission: RE | Admit: 2020-07-13 | Discharge: 2020-07-13 | Disposition: A | Discharge status: Home / Self Care | Payer: Medicare HMO | Source: Ambulatory Visit | Attending: Radiation Oncology | Admitting: Radiation Oncology

## 2020-07-13 ENCOUNTER — Other Ambulatory Visit: Payer: Self-pay

## 2020-07-13 DIAGNOSIS — Z510 Encounter for antineoplastic radiation therapy: Secondary | ICD-10-CM | POA: Diagnosis not present

## 2020-07-14 ENCOUNTER — Ambulatory Visit: Payer: Medicare HMO

## 2020-07-14 ENCOUNTER — Ambulatory Visit
Admission: RE | Admit: 2020-07-14 | Discharge: 2020-07-14 | Disposition: A | Discharge status: Home / Self Care | Payer: Medicare HMO | Source: Ambulatory Visit | Attending: Radiation Oncology | Admitting: Radiation Oncology

## 2020-07-14 ENCOUNTER — Encounter (HOSPITAL_COMMUNITY): Payer: Self-pay

## 2020-07-14 ENCOUNTER — Ambulatory Visit (HOSPITAL_COMMUNITY)
Admission: RE | Admit: 2020-07-14 | Discharge: 2020-07-14 | Disposition: A | Discharge status: Home / Self Care | Payer: Medicare HMO | Source: Ambulatory Visit | Attending: Physician Assistant | Admitting: Physician Assistant

## 2020-07-14 DIAGNOSIS — Z510 Encounter for antineoplastic radiation therapy: Secondary | ICD-10-CM | POA: Diagnosis not present

## 2020-07-14 DIAGNOSIS — C342 Malignant neoplasm of middle lobe, bronchus or lung: Secondary | ICD-10-CM | POA: Insufficient documentation

## 2020-07-14 MED ORDER — IOHEXOL 300 MG/ML  SOLN
75.0000 mL | Freq: Once | INTRAMUSCULAR | Status: AC | PRN
  Administered 2020-07-14: 75 mL via INTRAVENOUS

## 2020-07-15 ENCOUNTER — Ambulatory Visit
Admission: RE | Admit: 2020-07-15 | Discharge: 2020-07-15 | Disposition: A | Discharge status: Home / Self Care | Payer: Medicare HMO | Source: Ambulatory Visit | Attending: Radiation Oncology | Admitting: Radiation Oncology

## 2020-07-15 ENCOUNTER — Ambulatory Visit: Payer: Medicare HMO

## 2020-07-15 ENCOUNTER — Other Ambulatory Visit: Payer: Self-pay

## 2020-07-15 DIAGNOSIS — Z510 Encounter for antineoplastic radiation therapy: Secondary | ICD-10-CM | POA: Insufficient documentation

## 2020-07-15 DIAGNOSIS — Z87891 Personal history of nicotine dependence: Secondary | ICD-10-CM | POA: Diagnosis not present

## 2020-07-15 DIAGNOSIS — C7931 Secondary malignant neoplasm of brain: Secondary | ICD-10-CM | POA: Diagnosis present

## 2020-07-15 DIAGNOSIS — C342 Malignant neoplasm of middle lobe, bronchus or lung: Secondary | ICD-10-CM | POA: Diagnosis present

## 2020-07-18 ENCOUNTER — Ambulatory Visit: Payer: Medicare HMO

## 2020-07-18 ENCOUNTER — Ambulatory Visit: Payer: Medicare HMO | Admitting: Gastroenterology

## 2020-07-18 ENCOUNTER — Other Ambulatory Visit: Payer: Self-pay

## 2020-07-18 ENCOUNTER — Ambulatory Visit
Admission: RE | Admit: 2020-07-18 | Discharge: 2020-07-18 | Disposition: A | Discharge status: Home / Self Care | Payer: Medicare HMO | Source: Ambulatory Visit | Attending: Radiation Oncology | Admitting: Radiation Oncology

## 2020-07-18 DIAGNOSIS — Z510 Encounter for antineoplastic radiation therapy: Secondary | ICD-10-CM | POA: Diagnosis not present

## 2020-07-19 ENCOUNTER — Inpatient Hospital Stay: Payer: Medicare HMO | Attending: Internal Medicine | Admitting: Internal Medicine

## 2020-07-19 ENCOUNTER — Inpatient Hospital Stay: Payer: Medicare HMO

## 2020-07-19 ENCOUNTER — Encounter: Payer: Self-pay | Admitting: Internal Medicine

## 2020-07-19 ENCOUNTER — Other Ambulatory Visit: Payer: Self-pay | Admitting: Medical Oncology

## 2020-07-19 ENCOUNTER — Other Ambulatory Visit: Payer: Self-pay

## 2020-07-19 ENCOUNTER — Other Ambulatory Visit: Payer: Self-pay | Admitting: Internal Medicine

## 2020-07-19 ENCOUNTER — Ambulatory Visit
Admission: RE | Admit: 2020-07-19 | Discharge: 2020-07-19 | Disposition: A | Discharge status: Home / Self Care | Payer: Medicare HMO | Source: Ambulatory Visit | Attending: Radiation Oncology | Admitting: Radiation Oncology

## 2020-07-19 ENCOUNTER — Ambulatory Visit: Payer: Medicare HMO

## 2020-07-19 VITALS — BP 102/48 | HR 82 | Temp 98.4°F | Resp 18

## 2020-07-19 DIAGNOSIS — Z23 Encounter for immunization: Secondary | ICD-10-CM | POA: Diagnosis not present

## 2020-07-19 DIAGNOSIS — Z5189 Encounter for other specified aftercare: Secondary | ICD-10-CM | POA: Insufficient documentation

## 2020-07-19 DIAGNOSIS — D649 Anemia, unspecified: Secondary | ICD-10-CM

## 2020-07-19 DIAGNOSIS — C7931 Secondary malignant neoplasm of brain: Secondary | ICD-10-CM | POA: Insufficient documentation

## 2020-07-19 DIAGNOSIS — D6481 Anemia due to antineoplastic chemotherapy: Secondary | ICD-10-CM | POA: Diagnosis not present

## 2020-07-19 DIAGNOSIS — Z87891 Personal history of nicotine dependence: Secondary | ICD-10-CM | POA: Diagnosis not present

## 2020-07-19 DIAGNOSIS — C342 Malignant neoplasm of middle lobe, bronchus or lung: Secondary | ICD-10-CM

## 2020-07-19 DIAGNOSIS — Z510 Encounter for antineoplastic radiation therapy: Secondary | ICD-10-CM | POA: Diagnosis not present

## 2020-07-19 DIAGNOSIS — Z5111 Encounter for antineoplastic chemotherapy: Secondary | ICD-10-CM | POA: Insufficient documentation

## 2020-07-19 LAB — CBC WITH DIFFERENTIAL (CANCER CENTER ONLY)
Abs Immature Granulocytes: 0.06 10*3/uL (ref 0.00–0.07)
Basophils Absolute: 0 10*3/uL (ref 0.0–0.1)
Basophils Relative: 0 %
Eosinophils Absolute: 0.1 10*3/uL (ref 0.0–0.5)
Eosinophils Relative: 1 %
HCT: 26.2 % — ABNORMAL LOW (ref 36.0–46.0)
Hemoglobin: 8.5 g/dL — ABNORMAL LOW (ref 12.0–15.0)
Immature Granulocytes: 1 %
Lymphocytes Relative: 13 %
Lymphs Abs: 1.2 10*3/uL (ref 0.7–4.0)
MCH: 32.2 pg (ref 26.0–34.0)
MCHC: 32.4 g/dL (ref 30.0–36.0)
MCV: 99.2 fL (ref 80.0–100.0)
Monocytes Absolute: 0.6 10*3/uL (ref 0.1–1.0)
Monocytes Relative: 6 %
Neutro Abs: 7.6 10*3/uL (ref 1.7–7.7)
Neutrophils Relative %: 79 %
Platelet Count: 146 10*3/uL — ABNORMAL LOW (ref 150–400)
RBC: 2.64 MIL/uL — ABNORMAL LOW (ref 3.87–5.11)
RDW: 24.5 % — ABNORMAL HIGH (ref 11.5–15.5)
WBC Count: 9.6 10*3/uL (ref 4.0–10.5)
nRBC: 0 % (ref 0.0–0.2)

## 2020-07-19 LAB — CMP (CANCER CENTER ONLY)
ALT: 15 U/L (ref 0–44)
AST: 15 U/L (ref 15–41)
Albumin: 3.6 g/dL (ref 3.5–5.0)
Alkaline Phosphatase: 77 U/L (ref 38–126)
Anion gap: 2 — ABNORMAL LOW (ref 5–15)
BUN: 14 mg/dL (ref 8–23)
CO2: 29 mmol/L (ref 22–32)
Calcium: 9.7 mg/dL (ref 8.9–10.3)
Chloride: 106 mmol/L (ref 98–111)
Creatinine: 1.05 mg/dL — ABNORMAL HIGH (ref 0.44–1.00)
GFR, Estimated: 55 mL/min — ABNORMAL LOW (ref >60)
Glucose, Bld: 98 mg/dL (ref 70–99)
Potassium: 4.6 mmol/L (ref 3.5–5.1)
Sodium: 137 mmol/L (ref 135–145)
Total Bilirubin: 0.2 mg/dL — ABNORMAL LOW (ref 0.3–1.2)
Total Protein: 7 g/dL (ref 6.5–8.1)

## 2020-07-19 LAB — SAMPLE TO BLOOD BANK

## 2020-07-19 MED ORDER — SODIUM CHLORIDE 0.9 % IV SOLN
150.0000 mg | Freq: Once | INTRAVENOUS | Status: AC
  Administered 2020-07-19: 150 mg via INTRAVENOUS
  Filled 2020-07-19: qty 150

## 2020-07-19 MED ORDER — PALONOSETRON HCL INJECTION 0.25 MG/5ML
INTRAVENOUS | Status: AC
  Filled 2020-07-19: qty 5

## 2020-07-19 MED ORDER — SODIUM CHLORIDE 0.9 % IV SOLN
390.0000 mg | Freq: Once | INTRAVENOUS | Status: AC
  Administered 2020-07-19: 390 mg via INTRAVENOUS
  Filled 2020-07-19: qty 39

## 2020-07-19 MED ORDER — SODIUM CHLORIDE 0.9 % IV SOLN
75.0000 mg/m2 | Freq: Once | INTRAVENOUS | Status: AC
  Administered 2020-07-19: 130 mg via INTRAVENOUS
  Filled 2020-07-19: qty 6.5

## 2020-07-19 MED ORDER — PALONOSETRON HCL INJECTION 0.25 MG/5ML
0.2500 mg | Freq: Once | INTRAVENOUS | Status: AC
  Administered 2020-07-19: 0.25 mg via INTRAVENOUS

## 2020-07-19 MED ORDER — SODIUM CHLORIDE 0.9 % IV SOLN
10.0000 mg | Freq: Once | INTRAVENOUS | Status: AC
  Administered 2020-07-19: 10 mg via INTRAVENOUS
  Filled 2020-07-19: qty 10

## 2020-07-19 MED ORDER — SODIUM CHLORIDE 0.9 % IV SOLN
Freq: Once | INTRAVENOUS | Status: AC
  Filled 2020-07-19: qty 250

## 2020-07-19 NOTE — Progress Notes (Signed)
[place] [phone]   [phone]  OFFICE PROGRESS NOTE  [name], MD [address] [place] [address]  DIAGNOSIS: Extensive stage (T3, N2, M1c) small cell lung cancer presented with right middle lobe lung mass in addition to right hilar and low right paratracheal adenopathy in addition to tiny solitary brain metastasis diagnosed in August 2021.  PRIOR THERAPY:None  CURRENT THERAPY: Palliative systemic chemotherapy with carboplatin for AUC of 5 on day 1, etoposide 100 mg/M2 on days 1, 2 and 3 with Neulasta support.    Status post 2 cycles.  INTERVAL HISTORY: [name] 69 y.o. female returns to the clinic today for follow-up visit.  Her friend [name] was available by phone during the visit.  The patient is feeling fine today with no concerning complaints except for fatigue.  She denied having any chest pain, shortness of breath, cough or hemoptysis.  She has no nausea, vomiting, diarrhea or constipation.  She has no headache or visual changes.  She denied having any weight loss or night sweats.  She has been tolerating her treatment with systemic chemotherapy fairly well.  The patient had repeat CT scan of the chest performed recently and she is here for evaluation and discussion of her risk her results before starting cycle #3.  MEDICAL HISTORY: Past Medical History:  Diagnosis Date  . Allergic rhinitis   . 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 Anemia   . Anxiety   . ASCVD (arteriosclerotic cardiovascular disease)    MI in 96 requiring BMS CX; DES to M1 in 2000;normal coronary angiography in 2004  . Cholelithiasis   . COPD (chronic obstructive pulmonary disease) ([place])   . DDD (degenerative disc disease), lumbar   . Depression   . Diabetes mellitus    Type II  . Diarrhea   . DVT (deep venous thrombosis) ([place])    patient said no  . GERD (gastroesophageal reflux disease)   . Headache    migraine  . History  of kidney stones    2006  . Hyperlipidemia   . Hypotension   . Hypothyroidism   . Low back pain   . Myocardial infarction ([place]) 01/1995  . Nephrolithiasis 2006   stone extraction   . Panic attacks   . Peripheral neuropathy   . Peripheral vascular disease ([facility])    legs  . Pneumonia 12/22/2019  . Sciatic pain    right  . Tobacco abuse   . Tremor     ALLERGIES:  is allergic to iohexol, tape, ciprofloxacin, latex, penicillins, and povidone-iodine.  MEDICATIONS:  Current Outpatient Medications  Medication Sig Dispense Refill  . albuterol (PROVENTIL HFA;VENTOLIN HFA) 108 (90 BASE) MCG/ACT inhaler Inhale 2 puffs into the lungs every 4 (four) hours as needed for wheezing or shortness of breath. 1 Inhaler 0  . aspirin-acetaminophen-caffeine (EXCEDRIN MIGRAINE) 250-250-65 MG tablet Take 2 tablets by mouth daily as needed for headache. Up to 4 times a week    . azelastine (OPTIVAR) 0.05 % ophthalmic solution Place 1 drop into both eyes 2 (two) times daily.    Marland Kitchen b complex vitamins tablet Take 1 tablet by mouth daily. With Zinc and vitamin C    . Benzocaine (BOIL-EASE EX) Apply 1 application topically daily as needed (Boil).    . Biotin 5000 MCG TABS Take 10,000 mcg by mouth daily. Keratin    . carbamide peroxide (DEBROX) 6.5 % OTIC solution 5  drops daily as needed.    . Cholecalciferol (VITAMIN D3) 50 MCG (2000 UT) TABS Take 4,000 Units by mouth daily.    . colestipol (COLESTID) 5 g packet Take 3 g by mouth daily. Do not take within 2 hours of other medications. 18 packet 0  . Cyanocobalamin 2500 MCG CHEW Chew 2,500 mcg by mouth daily.    . diphenhydrAMINE (BENADRYL) 50 MG capsule Take 38m 1 hour before scheduled CT (Patient not taking: Reported on 06/15/2020) 1 capsule 0  . ferrous sulfate 325 (65 FE) MG tablet Take 325 mg by mouth daily with breakfast.    . Fluticasone-Umeclidin-Vilant (TRELEGY ELLIPTA) 100-62.5-25 MCG/INH AEPB Inhale 1 puff into the lungs daily. 28 each 5  .  Fluticasone-Umeclidin-Vilant (TRELEGY ELLIPTA) 100-62.5-25 MCG/INH AEPB Inhale 1 puff into the lungs daily. 60 each 0  . Fluticasone-Umeclidin-Vilant (TRELEGY ELLIPTA) 100-62.5-25 MCG/INH AEPB Inhale 1 puff into the lungs daily. 60 each 3  . furosemide (LASIX) 40 MG tablet Take 1 tablet (40 mg total) by mouth 2 (two) times daily. (Patient taking differently: Take 40 mg by mouth daily. ) 30 tablet 1  . Ginkgo Biloba Extract (GNP GINGKO BILOBA EXTRACT) 60 MG CAPS Take 2 capsules by mouth daily.     . hydrocortisone cream 1 % Apply 1 application topically daily as needed for itching.    .Marland KitchenHydrocortisone, Perianal, (PROCTO-PAK) 1 % CREA Apply anorectally twice daily for two weeks. 28 g 0  . ibuprofen (ADVIL) 200 MG tablet Take 200-600 mg by mouth every 6 (six) hours as needed for moderate pain.    .Marland KitchenLANTUS SOLOSTAR 100 UNIT/ML Solostar Pen Inject 16 Units into the skin daily.     .Marland Kitchenlevothyroxine (SYNTHROID) 100 MCG tablet Take 100 mcg by mouth daily before breakfast.    . lisinopril (ZESTRIL) 20 MG tablet Take 20 mg by mouth at bedtime.    .Marland KitchenLORazepam (ATIVAN) 0.5 MG tablet Take 1 tablet 30 minutes before the MRI.  Repeat once if needed. (Patient not taking: Reported on 06/15/2020) 2 tablet 0  . Menthol, Topical Analgesic, (ICY HOT) 7.5 % (Roll) MISC Apply 1 each topically daily as needed (Pain). spray     . metFORMIN (GLUCOPHAGE) 500 MG tablet Take 500 mg by mouth 2 (two) times daily with a meal.     . Multiple Vitamins-Minerals (MULTIVITAMIN WITH MINERALS) tablet Take 1 tablet by mouth daily. Adult 50+    . neomycin-bacitracin-polymyxin (NEOSPORIN) OINT Apply 1 application topically daily as needed for irritation or wound care.    . Nitroglycerin 0.4 % OINT Place one inch anorectally with a gloved finger every 12 hours for up to 3 weeks for anal fissure. Apply with a gloved finger to prevent absorption of nitroglycerin through the skin which can lead to headache. 30 g 0  . Omega 3-6-9 Fatty Acids (OMEGA  3-6-9 COMPLEX PO) Take 2 tablets by mouth daily.    .Marland KitchenoxyCODONE-acetaminophen (PERCOCET/ROXICET) 5-325 MG tablet Take 0.5 tablets by mouth 2 (two) times daily as needed for severe pain.    . pantoprazole (PROTONIX) 40 MG tablet Take 40 mg by mouth daily.    .Marland KitchenPARoxetine (PAXIL) 20 MG tablet Take 20 mg by mouth daily.     . predniSONE (DELTASONE) 50 MG tablet Take 50 mg 13 hours, 7 hours, and 2 hours before scheduled CT scan 3 tablet 0  . prochlorperazine (COMPAZINE) 10 MG tablet Take 1 tablet (10 mg total) by mouth every 6 (six) hours as needed. 30 tablet 2  .  pyridOXINE (VITAMIN B-6) 50 MG tablet Take 200 mg by mouth daily.    . rosuvastatin (CRESTOR) 10 MG tablet Take 1 tablet (10 mg total) by mouth daily. 90 tablet 3  . traZODone (DESYREL) 150 MG tablet Take 150 mg by mouth at bedtime.      No current facility-administered medications for this visit.    SURGICAL HISTORY:  Past Surgical History:  Procedure Laterality Date  . AGILE CAPSULE N/A 03/07/2020   Procedure: AGILE CAPSULE;  Surgeon: Daneil Dolin, MD;  [facility];  Service: Endoscopy;  Laterality: N/A;  7:30am  . BACK SURGERY     fusion  . BRONCHIAL NEEDLE ASPIRATION BIOPSY N/A 05/20/2020   Procedure: BRONCHIAL NEEDLE ASPIRATION BIOPSIES;  Surgeon: Collene Gobble, MD;  Location: [facility] ENDOSCOPY;  Service: Pulmonary;  Laterality: N/A;  . CHOLECYSTECTOMY    . COLONOSCOPY  01/2009   ZLD:[id number] rectum/repeat in 5 yrs  . COLONOSCOPY N/A 04/29/2014   Dr.Rourk- attempted/incomplete colonoscopy. inadequate prep  . COLONOSCOPY N/A 05/27/2014   BLT:[id number] coli. Colonic polyps-removed as described above.Status post segmental biopsy. single tubular adenoma and random colon bx neg  . COLONOSCOPY WITH PROPOFOL N/A 12/23/2019   Procedure: COLONOSCOPY WITH PROPOFOL;  Surgeon: Ronnette Juniper, MD;  Location: [place];  Service: Gastroenterology;  Laterality: N/A;  . DILATION AND CURETTAGE OF UTERUS  1974  .  ESOPHAGOGASTRODUODENOSCOPY  05/2010   Dr. Tessie Fass, erosion. 25F dilation  . ESOPHAGOGASTRODUODENOSCOPY N/A 04/29/2014   Dr.Rourk- normal esophagus s/p passage of maloney dilator. small hiatal hernia- bx= chronic inflammation.  . ESOPHAGOGASTRODUODENOSCOPY (EGD) WITH PROPOFOL N/A 12/23/2019   Procedure: ESOPHAGOGASTRODUODENOSCOPY (EGD) WITH PROPOFOL;  Surgeon: Ronnette Juniper, MD;  Location: [place];  Service: Gastroenterology;  Laterality: N/A;  . GIVENS CAPSULE STUDY N/A 03/22/2020   Procedure: GIVENS CAPSULE STUDY;  Surgeon: Daneil Dolin, MD;  [facility];  Service: Endoscopy;  Laterality: N/A;  7:30am  . HEMOSTASIS CLIP PLACEMENT  12/23/2019   Procedure: HEMOSTASIS CLIP PLACEMENT;  Surgeon: Ronnette Juniper, MD;  Location: [facility];  Service: Gastroenterology;;  . HOT HEMOSTASIS N/A 12/23/2019   Procedure: HOT HEMOSTASIS (ARGON PLASMA COAGULATION/BICAP);  Surgeon: Ronnette Juniper, MD;  Location: [facility];  Service: Gastroenterology;  Laterality: N/A;  . LEFT HEART CATH AND CORONARY ANGIOGRAPHY N/A 12/24/2019   Procedure: LEFT HEART CATH AND CORONARY ANGIOGRAPHY;  Surgeon: Nigel Mormon, MD;  Location: [place] [facility] CV LAB;  Service: Cardiovascular;  Laterality: N/A;  . Venia Minks DILATION N/A 04/29/2014   Procedure: Venia Minks DILATION;  Surgeon: Daneil Dolin, MD;  [facility];  Service: Endoscopy;  Laterality: N/A;  . PARTIAL HYSTERECTOMY  1978  . POLYPECTOMY  12/23/2019   Procedure: POLYPECTOMY;  Surgeon: Ronnette Juniper, MD;  Location: [facility] ENDOSCOPY;  Service: Gastroenterology;;  . SHOULDER SURGERY Left    Left shoulder for RTC;left arm surgery '98/left hand surgery 2001  . TOTAL ABDOMINAL HYSTERECTOMY W/ BILATERAL SALPINGOOPHORECTOMY  2002  . UMBILICAL HERNIA REPAIR  2008  . VIDEO BRONCHOSCOPY WITH ENDOBRONCHIAL ULTRASOUND N/A 05/20/2020   Procedure: VIDEO BRONCHOSCOPY WITH ENDOBRONCHIAL ULTRASOUND;  Surgeon: Collene Gobble, MD;  Location[facility] END[facility]COPY;  Service:  Pulmonary;  Laterality: N/A;    REVIEW OF SYSTEMS:  Constitutional: positive for fatigue Eyes: negative Ears, nose, mouth, throat, and face: negative Respiratory: negative Cardiovascular: negative Gastrointestinal: negative Genitourinary:negative Integument/breast: negative Hematologic/lymphatic: negative Musculoskeletal:negative Neurological: negative Behavioral/Psych: negative Endocrine: negative Allergic/Immunologic: negative   PHYSICAL EXAMINATION: General appearance: alert, cooperative, fatigued and no distress Head: Normocephalic, without obvious  abnormality, atraumatic Neck: no adenopathy, no JVD, supple, symmetrical, trachea midline and thyroid not enlarged, symmetric, no tenderness/mass/nodules Lymph nodes: Cervical, supraclavicular, and axillary nodes normal. Resp: clear to auscultation bilaterally Back: symmetric, no curvature. ROM normal. No CVA tenderness. Cardio: regular rate and rhythm, S1, S2 normal, no murmur, click, rub or gallop GI: soft, non-tender; bowel sounds normal; no masses,  no organomegaly Extremities: extremities normal, atraumatic, no cyanosis or edema Neurologic: Alert and oriented X 3, normal strength and tone. Normal symmetric reflexes. Normal coordination and gait  ECOG PERFORMANCE STATUS: 1 - Symptomatic but completely ambulatory  There were no vitals taken for this visit.  LABORATORY DATA: Lab Results  Component Value Date   WBC 9.6 07/19/2020   HGB 8.5 (L) 07/19/2020   HCT 26.2 (L) 07/19/2020   MCV 99.2 07/19/2020   PLT 146 (L) 07/19/2020      Chemistry      Component Value Date/Time   NA 137 07/19/2020 1144   NA 138 05/25/2020 0806   K 4.6 07/19/2020 1144   CL 106 07/19/2020 1144   CO2 29 07/19/2020 1144   BUN 14 07/19/2020 1144   BUN 26 05/25/2020 0806   CREATININE 1.05 (H) 07/19/2020 1144   CREATININE 0.85 05/03/2012 0942      Component Value Date/Time   CALCIUM 9.7 07/19/2020 1144   ALKPHOS 77 07/19/2020 1144   AST 15  07/19/2020 1144   ALT 15 07/19/2020 1144   BILITOT 0.2 (L) 07/19/2020 1144       RADIOGRAPHIC STUDIES: CT Chest W Contrast  Result Date: 07/14/2020 CLINICAL DATA:  Small-cell lung cancer restaging EXAM: CT CHEST WITH CONTRAST TECHNIQUE: Multidetector CT imaging of the chest was performed during intravenous contrast administration. CONTRAST:  46m OMNIPAQUE IOHEXOL 300 MG/ML  SOLN COMPARISON:  CT chest, 05/12/2020, PET-CT, 05/02/2020 FINDINGS: Cardiovascular: Aortic arthrosclerosis. Normal heart size. Three-vessel coronary artery calcifications. No pericardial effusion. Mediastinum/Nodes: Interval reduction in size of enlarged, FDG avid pretracheal and right hilar lymph nodes, index pretracheal node measuring 1.2 x 1.0 cm, previously 1.6 x 1.5 cm (series 2, image 43). Thyroid gland, trachea, and esophagus demonstrate no significant findings. Lungs/Pleura: Interval decrease in size of a mass of the lateral segment right middle lobe measuring approximately 3.5 x 3.5 cm, previously at least 5.4 x 4.4 cm (series 2, image 77). Mild centrilobular and paraseptal emphysema. Extensive peripheral irregular interstitial opacity in a pattern without clear apical to basal gradient. No pleural effusion or pneumothorax. Upper Abdomen: No acute abnormality. Musculoskeletal: No chest wall mass or suspicious bone lesions identified. IMPRESSION: 1. Interval decrease in size of an FDG avid mass of the lateral segment right middle lobe. 2. Interval reduction in size of enlarged, FDG avid pretracheal and right hilar lymph nodes. 3. Findings are consistent with treatment response of primary lung mass and nodal metastatic disease. 4. Extensive peripheral irregular interstitial opacity in a pattern without clear apical to basal gradient, consistent with mild pulmonary fibrosis, in an "indeterminate for UIP" pattern if characterized by ATS pulmonary fibrosis criteria. Attention on follow-up for stability of fibrotic findings and  pattern. 5. Emphysema (ICD10-J43.9). 6. Coronary artery disease. Aortic Atherosclerosis (ICD10-I70.0). Electronically Signed   By: AEddie CandleM.D.   On: 07/14/2020 11:52    ASSESSMENT AND PLAN: This is a very pleasant 69years old white female recently diagnosed with extensive stage small cell lung cancer (T3, N2, M1 C) presented with right middle lobe lung mass in addition to right hilar and low right paratracheal lymphadenopathy  in addition to tiny solitary brain metastasis diagnosed in August 2021. The patient is currently undergoing systemic chemotherapy with carboplatin for AUC of 5 on day 1, etoposide 100 mg/M2 with Neulasta support.  Status post 2 cycles.  The patient continues to tolerate her treatment well with no concerning adverse effects. She had repeat CT scan of the chest performed recently.  I personally and independently reviewed the scans and discussed the results and showed the images to the patient today.   Her scan showed significant improvement of her disease. I recommended for the patient to proceed with cycle #3 today as planned. I will see her back for follow-up visit in 3 weeks for evaluation before the next cycle of her treatment. For the chemotherapy-induced anemia, I will arrange for the patient to receive 2 units of PRBCs transfusion this week. She was advised to call immediately if she has any concerning symptoms in the interval. The patient voices understanding of current disease status and treatment options and is in agreement with the current care plan.  All questions were answered. The patient knows to call the clinic with any problems, questions or concerns. We can certainly see the patient much sooner if necessary.  Disclaimer: This note was dictated with voice recognition software. Similar sounding words can inadvertently be transcribed and may not be corrected upon review.

## 2020-07-19 NOTE — Patient Instructions (Signed)
[facility] Discharge Instructions for Patients Receiving Chemotherapy  Today you received the following chemotherapy agents Carboplatin (PARAPLATIN) & Etoposide (VEPESID).  To help prevent nausea and vomiting after your treatment, we encourage you to take your nausea medication as prescribed.   If you develop nausea and vomiting that is not controlled by your nausea medication, call the clinic.   BELOW ARE SYMPTOMS THAT SHOULD BE REPORTED IMMEDIATELY:  *FEVER GREATER THAN 100.5 F  *CHILLS WITH OR WITHOUT FEVER  NAUSEA AND VOMITING THAT IS NOT CONTROLLED WITH YOUR NAUSEA MEDICATION  *UNUSUAL SHORTNESS OF BREATH  *UNUSUAL BRUISING OR BLEEDING  TENDERNESS IN MOUTH AND THROAT WITH OR WITHOUT PRESENCE OF ULCERS  *URINARY PROBLEMS  *BOWEL PROBLEMS  UNUSUAL RASH Items with * indicate a potential emergency and should be followed up as soon as possible.  Feel free to call the clinic should you have any questions or concerns. The clinic phone number is [phone].  Please show the [place] at check-in to the Emergency Department and triage nurse.

## 2020-07-20 ENCOUNTER — Inpatient Hospital Stay: Payer: Medicare HMO

## 2020-07-20 ENCOUNTER — Ambulatory Visit: Payer: Medicare HMO

## 2020-07-20 ENCOUNTER — Telehealth: Payer: Self-pay | Admitting: Internal Medicine

## 2020-07-20 ENCOUNTER — Ambulatory Visit
Admission: RE | Admit: 2020-07-20 | Discharge: 2020-07-20 | Disposition: A | Discharge status: Home / Self Care | Payer: Medicare HMO | Source: Ambulatory Visit | Attending: Radiation Oncology | Admitting: Radiation Oncology

## 2020-07-20 ENCOUNTER — Other Ambulatory Visit: Payer: Self-pay

## 2020-07-20 VITALS — BP 119/55 | HR 79 | Temp 97.8°F | Resp 20

## 2020-07-20 DIAGNOSIS — D649 Anemia, unspecified: Secondary | ICD-10-CM

## 2020-07-20 DIAGNOSIS — C342 Malignant neoplasm of middle lobe, bronchus or lung: Secondary | ICD-10-CM

## 2020-07-20 DIAGNOSIS — Z510 Encounter for antineoplastic radiation therapy: Secondary | ICD-10-CM | POA: Diagnosis not present

## 2020-07-20 DIAGNOSIS — Z23 Encounter for immunization: Secondary | ICD-10-CM

## 2020-07-20 DIAGNOSIS — Z5111 Encounter for antineoplastic chemotherapy: Secondary | ICD-10-CM | POA: Diagnosis not present

## 2020-07-20 LAB — PREPARE RBC (CROSSMATCH)

## 2020-07-20 MED ORDER — ACETAMINOPHEN 325 MG PO TABS
ORAL_TABLET | ORAL | Status: AC
  Filled 2020-07-20: qty 2

## 2020-07-20 MED ORDER — INFLUENZA VAC A&B SA ADJ QUAD 0.5 ML IM PRSY
PREFILLED_SYRINGE | INTRAMUSCULAR | Status: AC
  Filled 2020-07-20: qty 0.5

## 2020-07-20 MED ORDER — ACETAMINOPHEN 325 MG PO TABS
650.0000 mg | ORAL_TABLET | Freq: Once | ORAL | Status: AC
  Administered 2020-07-20: 650 mg via ORAL

## 2020-07-20 MED ORDER — SODIUM CHLORIDE 0.9 % IV SOLN
10.0000 mg | Freq: Once | INTRAVENOUS | Status: AC
  Administered 2020-07-20: 10 mg via INTRAVENOUS
  Filled 2020-07-20: qty 10

## 2020-07-20 MED ORDER — DEXAMETHASONE SODIUM PHOSPHATE 10 MG/ML IJ SOLN
INTRAMUSCULAR | Status: AC
  Filled 2020-07-20: qty 1

## 2020-07-20 MED ORDER — DIPHENHYDRAMINE HCL 25 MG PO CAPS
ORAL_CAPSULE | ORAL | Status: AC
  Filled 2020-07-20: qty 1

## 2020-07-20 MED ORDER — SODIUM CHLORIDE 0.9 % IV SOLN
Freq: Once | INTRAVENOUS | Status: AC
  Filled 2020-07-20: qty 250

## 2020-07-20 MED ORDER — SODIUM CHLORIDE 0.9 % IV SOLN
75.0000 mg/m2 | Freq: Once | INTRAVENOUS | Status: AC
  Administered 2020-07-20: 130 mg via INTRAVENOUS
  Filled 2020-07-20: qty 6.5

## 2020-07-20 MED ORDER — INFLUENZA VAC A&B SA ADJ QUAD 0.5 ML IM PRSY
0.5000 mL | PREFILLED_SYRINGE | Freq: Once | INTRAMUSCULAR | Status: AC
  Administered 2020-07-20: 0.5 mL via INTRAMUSCULAR

## 2020-07-20 MED ORDER — SODIUM CHLORIDE 0.9% IV SOLUTION
250.0000 mL | Freq: Once | INTRAVENOUS | Status: AC
  Administered 2020-07-20: 250 mL via INTRAVENOUS
  Filled 2020-07-20: qty 250

## 2020-07-20 MED ORDER — DIPHENHYDRAMINE HCL 25 MG PO CAPS
25.0000 mg | ORAL_CAPSULE | Freq: Once | ORAL | Status: AC
  Administered 2020-07-20: 25 mg via ORAL

## 2020-07-20 NOTE — Patient Instructions (Signed)
[facility] Discharge Instructions for Patients Receiving Chemotherapy  Today you received the following chemotherapy agents: Etoposide (VEPESID).  To help prevent nausea and vomiting after your treatment, we encourage you to take your nausea medication as prescribed.   If you develop nausea and vomiting that is not controlled by your nausea medication, call the clinic.   BELOW ARE SYMPTOMS THAT SHOULD BE REPORTED IMMEDIATELY:  *FEVER GREATER THAN 100.5 F  *CHILLS WITH OR WITHOUT FEVER  NAUSEA AND VOMITING THAT IS NOT CONTROLLED WITH YOUR NAUSEA MEDICATION  *UNUSUAL SHORTNESS OF BREATH  *UNUSUAL BRUISING OR BLEEDING  TENDERNESS IN MOUTH AND THROAT WITH OR WITHOUT PRESENCE OF ULCERS  *URINARY PROBLEMS  *BOWEL PROBLEMS  UNUSUAL RASH Items with * indicate a potential emergency and should be followed up as soon as possible.  Feel free to call the clinic should you have any questions or concerns. The clinic phone number is [phone].  Please show the [place] [facility] at check-in to the Emergency Department and triage nurse.   https://[email or web address]/donate-blood/blood-donation-[email or web address]-happens-to-donated-blood/blood-transfusions/types-of-blood-transfusions.html"> https://[email or web address]/donate-blood/blood-donation-process/what-happens-to-donated-blood/blood-transfusions/risks-complications.html">  Blood Transfusion, Adult, Care After This sheet gives you information about how to care for yourself after your procedure. Your health care provider may also give you more specific instructions. If you have problems or questions, contact your health care provider. What can I expect after the procedure? After the procedure, it is common to have:  Bruising and soreness where the IV was inserted.  A fever or chills on the day of the procedure. This may be your body's response to the new blood cells received.  A headache. Follow these instructions at  home: IV insertion site care      Follow instructions from your health care provider about how to take care of your IV insertion site. Make sure you: ? Wash your hands with soap and water before and after you change your bandage (dressing). If soap and water are not available, use hand sanitizer. ? Change your dressing as told by your health care provider.  Check your IV insertion site every day for signs of infection. Check for: ? Redness, swelling, or pain. ? Bleeding from the site. ? Warmth. ? Pus or a bad smell. General instructions  Take over-the-counter and prescription medicines only as told by your health care provider.  Rest as told by your health care provider.  Return to your normal activities as told by your health care provider.  Keep all follow-up visits as told by your health care provider. This is important. Contact a health care provider if:  You have itching or red, swollen areas of skin (hives).  You feel anxious.  You feel weak after doing your normal activities.  You have redness, swelling, warmth, or pain around the IV insertion site.  You have blood coming from the IV insertion site that does not stop with pressure.  You have pus or a bad smell coming from your IV insertion site. Get help right away if:  You have symptoms of a serious allergic or immune system reaction, including: ? Trouble breathing or shortness of breath. ? Swelling of the face or feeling flushed. ? Fever or chills. ? Pain in the head, back, or chest. ? Dark urine or blood in the urine. ? Widespread rash. ? Fast heartbeat. ? Feeling dizzy or light-headed. If you receive your blood transfusion in an outpatient setting, you will be told whom to contact to report any reactions. These symptoms may represent a serious problem that is an emergency. Do  not wait to see if the symptoms will go away. Get medical help right away. Call your local emergency services (911 in the [place] Do  not drive yourself to the hospital. Summary  Bruising and tenderness around the IV insertion site are common.  Check your IV insertion site every day for signs of infection.  Rest as told by your health care provider. Return to your normal activities as told by your health care provider.  Get help right away for symptoms of a serious allergic or immune system reaction to blood transfusion. This information is not intended to replace advice given to you by your health care provider. Make sure you discuss any questions you have with your health care provider. Document Revised: 03/26/2019 Document Reviewed: 03/26/2019 Elsevier Patient Education  [place].

## 2020-07-20 NOTE — Telephone Encounter (Signed)
Added additional appt per 10/5 los - pt to get an updated schedule in infusion

## 2020-07-21 ENCOUNTER — Ambulatory Visit: Payer: Medicare HMO

## 2020-07-21 ENCOUNTER — Ambulatory Visit
Admission: RE | Admit: 2020-07-21 | Discharge: 2020-07-21 | Disposition: A | Discharge status: Home / Self Care | Payer: Medicare HMO | Source: Ambulatory Visit | Attending: Radiation Oncology | Admitting: Radiation Oncology

## 2020-07-21 ENCOUNTER — Other Ambulatory Visit: Payer: Self-pay

## 2020-07-21 ENCOUNTER — Inpatient Hospital Stay: Payer: Medicare HMO

## 2020-07-21 VITALS — BP 135/56 | HR 69 | Temp 98.1°F | Resp 18

## 2020-07-21 DIAGNOSIS — Z5111 Encounter for antineoplastic chemotherapy: Secondary | ICD-10-CM | POA: Diagnosis not present

## 2020-07-21 DIAGNOSIS — C342 Malignant neoplasm of middle lobe, bronchus or lung: Secondary | ICD-10-CM

## 2020-07-21 DIAGNOSIS — Z510 Encounter for antineoplastic radiation therapy: Secondary | ICD-10-CM | POA: Diagnosis not present

## 2020-07-21 LAB — TYPE AND SCREEN
ABO/RH(D): A NEG
Antibody Screen: NEGATIVE
Unit division: 0

## 2020-07-21 LAB — BPAM RBC
Blood Product Expiration Date: 202110142359
ISSUE DATE / TIME: 202110060915
Unit Type and Rh: 600

## 2020-07-21 MED ORDER — SODIUM CHLORIDE 0.9 % IV SOLN
75.0000 mg/m2 | Freq: Once | INTRAVENOUS | Status: AC
  Administered 2020-07-21: 130 mg via INTRAVENOUS
  Filled 2020-07-21: qty 6.5

## 2020-07-21 MED ORDER — SODIUM CHLORIDE 0.9 % IV SOLN
Freq: Once | INTRAVENOUS | Status: AC
  Filled 2020-07-21: qty 250

## 2020-07-21 MED ORDER — SODIUM CHLORIDE 0.9 % IV SOLN
10.0000 mg | Freq: Once | INTRAVENOUS | Status: AC
  Administered 2020-07-21: 10 mg via INTRAVENOUS
  Filled 2020-07-21: qty 10

## 2020-07-21 NOTE — Patient Instructions (Addendum)
[facility] Discharge Instructions for Patients Receiving Chemotherapy  Today you received the following chemotherapy agents: Etoposide (VEPESID).  To help prevent nausea and vomiting after your treatment, we encourage you to take your nausea medication as prescribed.   If you develop nausea and vomiting that is not controlled by your nausea medication, call the clinic.   BELOW ARE SYMPTOMS THAT SHOULD BE REPORTED IMMEDIATELY:  *FEVER GREATER THAN 100.5 F  *CHILLS WITH OR WITHOUT FEVER  NAUSEA AND VOMITING THAT IS NOT CONTROLLED WITH YOUR NAUSEA MEDICATION  *UNUSUAL SHORTNESS OF BREATH  *UNUSUAL BRUISING OR BLEEDING  TENDERNESS IN MOUTH AND THROAT WITH OR WITHOUT PRESENCE OF ULCERS  *URINARY PROBLEMS  *BOWEL PROBLEMS  UNUSUAL RASH Items with * indicate a potential emergency and should be followed up as soon as possible.  Feel free to call the clinic should you have any questions or concerns. The clinic phone number is [phone].  Please show the [facility] at check-in to the Emergency Department and triage nurse.

## 2020-07-22 ENCOUNTER — Ambulatory Visit
Admission: RE | Admit: 2020-07-22 | Discharge: 2020-07-22 | Disposition: A | Discharge status: Home / Self Care | Payer: Medicare HMO | Source: Ambulatory Visit | Attending: Radiation Oncology | Admitting: Radiation Oncology

## 2020-07-22 ENCOUNTER — Telehealth: Payer: Self-pay | Admitting: *Deleted

## 2020-07-22 ENCOUNTER — Ambulatory Visit: Payer: Medicare HMO

## 2020-07-22 ENCOUNTER — Other Ambulatory Visit: Payer: Self-pay

## 2020-07-22 DIAGNOSIS — Z510 Encounter for antineoplastic radiation therapy: Secondary | ICD-10-CM | POA: Diagnosis not present

## 2020-07-22 NOTE — Telephone Encounter (Signed)
Received call patient has not had a BM in 3 days.

## 2020-07-22 NOTE — Telephone Encounter (Signed)
Spoke with pt. Pt was notified of Neil Crouch, PA recommendations. She will hold Colestid and Bentyl on day she has no BM or stools are hard. Pt given Miralax instructions.

## 2020-07-22 NOTE — Telephone Encounter (Signed)
Spoke with pt. Pt hasn't had a BM in three days. Pt took two Dulcolax and it hasn't moved her bowels. Pt states when she drinks her coffee in the mornings, her bowels usually move. Pt has increased her fluids and has been eating without problems. Pt reports no abdominal pain, some pressure at her rectum, no n/v.

## 2020-07-22 NOTE — Telephone Encounter (Signed)
Please make sure she HOLDS colestid and bentyl whenever her stools are hard or if no BM in 24 hours. Both of these given to her for chronic diarrhea but can make constipated.  To manage constipation today, She can get a bottle of miralax and take one capful every hour up to five doses to have a good bm. Can stop after a good bm. Drink 6 ounces of water 30 minutes after each dose of miralax.

## 2020-07-23 ENCOUNTER — Other Ambulatory Visit: Payer: Self-pay

## 2020-07-23 ENCOUNTER — Inpatient Hospital Stay: Payer: Medicare HMO

## 2020-07-23 VITALS — BP 114/65 | HR 84 | Temp 97.8°F | Resp 17

## 2020-07-23 DIAGNOSIS — Z5111 Encounter for antineoplastic chemotherapy: Secondary | ICD-10-CM | POA: Diagnosis not present

## 2020-07-23 MED ORDER — PEGFILGRASTIM-JMDB 6 MG/0.6ML ~~LOC~~ SOSY
6.0000 mg | PREFILLED_SYRINGE | Freq: Once | SUBCUTANEOUS | Status: AC
  Administered 2020-07-23: 6 mg via SUBCUTANEOUS

## 2020-07-23 NOTE — Patient Instructions (Signed)

## 2020-07-25 ENCOUNTER — Ambulatory Visit: Payer: Medicare HMO

## 2020-07-25 ENCOUNTER — Ambulatory Visit
Admission: RE | Admit: 2020-07-25 | Discharge: 2020-07-25 | Disposition: A | Discharge status: Home / Self Care | Payer: Medicare HMO | Source: Ambulatory Visit | Attending: Radiation Oncology | Admitting: Radiation Oncology

## 2020-07-25 DIAGNOSIS — Z510 Encounter for antineoplastic radiation therapy: Secondary | ICD-10-CM | POA: Diagnosis not present

## 2020-07-26 ENCOUNTER — Ambulatory Visit
Admission: RE | Admit: 2020-07-26 | Discharge: 2020-07-26 | Disposition: A | Discharge status: Home / Self Care | Payer: Medicare HMO | Source: Ambulatory Visit | Attending: Radiation Oncology | Admitting: Radiation Oncology

## 2020-07-26 ENCOUNTER — Other Ambulatory Visit: Payer: Self-pay

## 2020-07-26 ENCOUNTER — Ambulatory Visit: Payer: Medicare HMO

## 2020-07-26 ENCOUNTER — Other Ambulatory Visit: Payer: Self-pay | Admitting: Radiation Oncology

## 2020-07-26 ENCOUNTER — Other Ambulatory Visit: Payer: Medicare HMO

## 2020-07-26 DIAGNOSIS — Z510 Encounter for antineoplastic radiation therapy: Secondary | ICD-10-CM | POA: Diagnosis not present

## 2020-07-26 MED ORDER — SUCRALFATE 1 G PO TABS: 1.0000 g | ORAL_TABLET | Freq: Three times a day (TID) | ORAL | 1 refills | Status: DC

## 2020-07-27 ENCOUNTER — Ambulatory Visit
Admission: RE | Admit: 2020-07-27 | Discharge: 2020-07-27 | Disposition: A | Discharge status: Home / Self Care | Payer: Medicare HMO | Source: Ambulatory Visit | Attending: Radiation Oncology | Admitting: Radiation Oncology

## 2020-07-27 ENCOUNTER — Other Ambulatory Visit: Payer: Self-pay

## 2020-07-27 ENCOUNTER — Inpatient Hospital Stay: Payer: Medicare HMO

## 2020-07-27 ENCOUNTER — Other Ambulatory Visit: Payer: Self-pay | Admitting: Medical Oncology

## 2020-07-27 ENCOUNTER — Ambulatory Visit: Payer: Medicare HMO

## 2020-07-27 DIAGNOSIS — C342 Malignant neoplasm of middle lobe, bronchus or lung: Secondary | ICD-10-CM

## 2020-07-27 DIAGNOSIS — Z510 Encounter for antineoplastic radiation therapy: Secondary | ICD-10-CM | POA: Diagnosis not present

## 2020-07-27 DIAGNOSIS — Z5111 Encounter for antineoplastic chemotherapy: Secondary | ICD-10-CM | POA: Diagnosis not present

## 2020-07-27 DIAGNOSIS — D649 Anemia, unspecified: Secondary | ICD-10-CM

## 2020-07-27 LAB — CMP (CANCER CENTER ONLY)
ALT: 32 U/L (ref 0–44)
AST: 30 U/L (ref 15–41)
Albumin: 3.6 g/dL (ref 3.5–5.0)
Alkaline Phosphatase: 87 U/L (ref 38–126)
Anion gap: 6 (ref 5–15)
BUN: 19 mg/dL (ref 8–23)
CO2: 23 mmol/L (ref 22–32)
Calcium: 9.8 mg/dL (ref 8.9–10.3)
Chloride: 107 mmol/L (ref 98–111)
Creatinine: 0.99 mg/dL (ref 0.44–1.00)
GFR, Estimated: 59 mL/min — ABNORMAL LOW (ref >60)
Glucose, Bld: 105 mg/dL — ABNORMAL HIGH (ref 70–99)
Potassium: 5.2 mmol/L — ABNORMAL HIGH (ref 3.5–5.1)
Sodium: 136 mmol/L (ref 135–145)
Total Bilirubin: 0.3 mg/dL (ref 0.3–1.2)
Total Protein: 6.9 g/dL (ref 6.5–8.1)

## 2020-07-27 LAB — CBC WITH DIFFERENTIAL (CANCER CENTER ONLY)
Abs Immature Granulocytes: 0.05 10*3/uL (ref 0.00–0.07)
Basophils Absolute: 0.1 10*3/uL (ref 0.0–0.1)
Basophils Relative: 1 %
Eosinophils Absolute: 0 10*3/uL (ref 0.0–0.5)
Eosinophils Relative: 0 %
HCT: 26.9 % — ABNORMAL LOW (ref 36.0–46.0)
Hemoglobin: 8.7 g/dL — ABNORMAL LOW (ref 12.0–15.0)
Immature Granulocytes: 1 %
Lymphocytes Relative: 8 %
Lymphs Abs: 0.4 10*3/uL — ABNORMAL LOW (ref 0.7–4.0)
MCH: 32.5 pg (ref 26.0–34.0)
MCHC: 32.3 g/dL (ref 30.0–36.0)
MCV: 100.4 fL — ABNORMAL HIGH (ref 80.0–100.0)
Monocytes Absolute: 0.4 10*3/uL (ref 0.1–1.0)
Monocytes Relative: 7 %
Neutro Abs: 4.6 10*3/uL (ref 1.7–7.7)
Neutrophils Relative %: 83 %
Platelet Count: 134 10*3/uL — ABNORMAL LOW (ref 150–400)
RBC: 2.68 MIL/uL — ABNORMAL LOW (ref 3.87–5.11)
RDW: 23 % — ABNORMAL HIGH (ref 11.5–15.5)
WBC Count: 5.4 10*3/uL (ref 4.0–10.5)
nRBC: 0 % (ref 0.0–0.2)

## 2020-07-27 LAB — SAMPLE TO BLOOD BANK

## 2020-07-28 ENCOUNTER — Ambulatory Visit: Payer: Medicare HMO

## 2020-07-28 ENCOUNTER — Ambulatory Visit
Admission: RE | Admit: 2020-07-28 | Discharge: 2020-07-28 | Disposition: A | Discharge status: Home / Self Care | Payer: Medicare HMO | Source: Ambulatory Visit | Attending: Radiation Oncology | Admitting: Radiation Oncology

## 2020-07-28 DIAGNOSIS — Z510 Encounter for antineoplastic radiation therapy: Secondary | ICD-10-CM | POA: Diagnosis not present

## 2020-07-29 ENCOUNTER — Ambulatory Visit: Payer: Medicare HMO

## 2020-07-29 ENCOUNTER — Ambulatory Visit
Admission: RE | Admit: 2020-07-29 | Discharge: 2020-07-29 | Disposition: A | Discharge status: Home / Self Care | Payer: Medicare HMO | Source: Ambulatory Visit | Attending: Radiation Oncology | Admitting: Radiation Oncology

## 2020-07-29 DIAGNOSIS — Z510 Encounter for antineoplastic radiation therapy: Secondary | ICD-10-CM | POA: Diagnosis not present

## 2020-08-01 ENCOUNTER — Ambulatory Visit: Payer: Medicare HMO

## 2020-08-01 ENCOUNTER — Ambulatory Visit
Admission: RE | Admit: 2020-08-01 | Discharge: 2020-08-01 | Disposition: A | Discharge status: Home / Self Care | Payer: Medicare HMO | Source: Ambulatory Visit | Attending: Radiation Oncology | Admitting: Radiation Oncology

## 2020-08-01 DIAGNOSIS — Z510 Encounter for antineoplastic radiation therapy: Secondary | ICD-10-CM | POA: Diagnosis not present

## 2020-08-02 ENCOUNTER — Ambulatory Visit: Payer: Medicare HMO

## 2020-08-02 ENCOUNTER — Other Ambulatory Visit: Payer: Self-pay

## 2020-08-02 ENCOUNTER — Ambulatory Visit
Admission: RE | Admit: 2020-08-02 | Discharge: 2020-08-02 | Disposition: A | Discharge status: Home / Self Care | Payer: Medicare HMO | Source: Ambulatory Visit | Attending: Radiation Oncology | Admitting: Radiation Oncology

## 2020-08-02 ENCOUNTER — Inpatient Hospital Stay: Payer: Medicare HMO

## 2020-08-02 DIAGNOSIS — Z5111 Encounter for antineoplastic chemotherapy: Secondary | ICD-10-CM | POA: Diagnosis not present

## 2020-08-02 DIAGNOSIS — Z510 Encounter for antineoplastic radiation therapy: Secondary | ICD-10-CM | POA: Diagnosis not present

## 2020-08-02 DIAGNOSIS — C342 Malignant neoplasm of middle lobe, bronchus or lung: Secondary | ICD-10-CM

## 2020-08-02 LAB — CBC WITH DIFFERENTIAL (CANCER CENTER ONLY)
Abs Immature Granulocytes: 0.05 10*3/uL (ref 0.00–0.07)
Basophils Absolute: 0 10*3/uL (ref 0.0–0.1)
Basophils Relative: 0 %
Eosinophils Absolute: 0 10*3/uL (ref 0.0–0.5)
Eosinophils Relative: 1 %
HCT: 26.9 % — ABNORMAL LOW (ref 36.0–46.0)
Hemoglobin: 8.7 g/dL — ABNORMAL LOW (ref 12.0–15.0)
Immature Granulocytes: 1 %
Lymphocytes Relative: 12 %
Lymphs Abs: 0.8 10*3/uL (ref 0.7–4.0)
MCH: 33 pg (ref 26.0–34.0)
MCHC: 32.3 g/dL (ref 30.0–36.0)
MCV: 101.9 fL — ABNORMAL HIGH (ref 80.0–100.0)
Monocytes Absolute: 0.5 10*3/uL (ref 0.1–1.0)
Monocytes Relative: 7 %
Neutro Abs: 5.5 10*3/uL (ref 1.7–7.7)
Neutrophils Relative %: 79 %
Platelet Count: 33 10*3/uL — ABNORMAL LOW (ref 150–400)
RBC: 2.64 MIL/uL — ABNORMAL LOW (ref 3.87–5.11)
RDW: 23.1 % — ABNORMAL HIGH (ref 11.5–15.5)
WBC Count: 6.9 10*3/uL (ref 4.0–10.5)
nRBC: 0 % (ref 0.0–0.2)

## 2020-08-02 LAB — CMP (CANCER CENTER ONLY)
ALT: 27 U/L (ref 0–44)
AST: 19 U/L (ref 15–41)
Albumin: 3.6 g/dL (ref 3.5–5.0)
Alkaline Phosphatase: 95 U/L (ref 38–126)
Anion gap: 5 (ref 5–15)
BUN: 16 mg/dL (ref 8–23)
CO2: 26 mmol/L (ref 22–32)
Calcium: 9.6 mg/dL (ref 8.9–10.3)
Chloride: 105 mmol/L (ref 98–111)
Creatinine: 1.12 mg/dL — ABNORMAL HIGH (ref 0.44–1.00)
GFR, Estimated: 50 mL/min — ABNORMAL LOW (ref >60)
Glucose, Bld: 96 mg/dL (ref 70–99)
Potassium: 5.2 mmol/L — ABNORMAL HIGH (ref 3.5–5.1)
Sodium: 136 mmol/L (ref 135–145)
Total Bilirubin: 0.2 mg/dL — ABNORMAL LOW (ref 0.3–1.2)
Total Protein: 7 g/dL (ref 6.5–8.1)

## 2020-08-03 ENCOUNTER — Ambulatory Visit
Admission: RE | Admit: 2020-08-03 | Discharge: 2020-08-03 | Disposition: A | Discharge status: Home / Self Care | Payer: Medicare HMO | Source: Ambulatory Visit | Attending: Radiation Oncology | Admitting: Radiation Oncology

## 2020-08-03 ENCOUNTER — Ambulatory Visit (HOSPITAL_COMMUNITY)
Admission: RE | Admit: 2020-08-03 | Discharge: 2020-08-03 | Disposition: A | Discharge status: Home / Self Care | Payer: Medicare HMO | Source: Ambulatory Visit | Attending: Medical | Admitting: Medical

## 2020-08-03 ENCOUNTER — Ambulatory Visit: Payer: Medicare HMO

## 2020-08-03 ENCOUNTER — Other Ambulatory Visit: Payer: Self-pay | Admitting: Medical

## 2020-08-03 ENCOUNTER — Inpatient Hospital Stay: Payer: Medicare HMO

## 2020-08-03 ENCOUNTER — Inpatient Hospital Stay (HOSPITAL_BASED_OUTPATIENT_CLINIC_OR_DEPARTMENT_OTHER): Payer: Medicare HMO | Admitting: Medical

## 2020-08-03 DIAGNOSIS — Z510 Encounter for antineoplastic radiation therapy: Secondary | ICD-10-CM | POA: Diagnosis not present

## 2020-08-03 DIAGNOSIS — Z23 Encounter for immunization: Secondary | ICD-10-CM

## 2020-08-03 DIAGNOSIS — C342 Malignant neoplasm of middle lobe, bronchus or lung: Secondary | ICD-10-CM | POA: Diagnosis present

## 2020-08-03 DIAGNOSIS — M7989 Other specified soft tissue disorders: Secondary | ICD-10-CM

## 2020-08-03 DIAGNOSIS — I82612 Acute embolism and thrombosis of superficial veins of left upper extremity: Secondary | ICD-10-CM

## 2020-08-03 DIAGNOSIS — C7931 Secondary malignant neoplasm of brain: Secondary | ICD-10-CM | POA: Diagnosis present

## 2020-08-03 DIAGNOSIS — Z5111 Encounter for antineoplastic chemotherapy: Secondary | ICD-10-CM | POA: Diagnosis not present

## 2020-08-03 MED ORDER — RIVAROXABAN (XARELTO) VTE STARTER PACK (15 & 20 MG): ORAL_TABLET | ORAL | 0 refills | Status: DC

## 2020-08-03 NOTE — Progress Notes (Signed)
Upper extremity venous has been completed.   Preliminary results in CV Proc.   Abram Sander 08/03/2020 1:05 PM

## 2020-08-04 ENCOUNTER — Ambulatory Visit: Payer: Medicare HMO

## 2020-08-04 ENCOUNTER — Ambulatory Visit
Admission: RE | Admit: 2020-08-04 | Discharge: 2020-08-04 | Disposition: A | Discharge status: Home / Self Care | Payer: Medicare HMO | Source: Ambulatory Visit | Attending: Radiation Oncology | Admitting: Radiation Oncology

## 2020-08-04 DIAGNOSIS — Z510 Encounter for antineoplastic radiation therapy: Secondary | ICD-10-CM | POA: Diagnosis not present

## 2020-08-05 ENCOUNTER — Ambulatory Visit: Payer: Medicare HMO

## 2020-08-05 ENCOUNTER — Ambulatory Visit
Admission: RE | Admit: 2020-08-05 | Discharge: 2020-08-05 | Disposition: A | Discharge status: Home / Self Care | Payer: Medicare HMO | Source: Ambulatory Visit | Attending: Radiation Oncology | Admitting: Radiation Oncology

## 2020-08-05 ENCOUNTER — Telehealth: Payer: Self-pay | Admitting: Internal Medicine

## 2020-08-05 DIAGNOSIS — Z510 Encounter for antineoplastic radiation therapy: Secondary | ICD-10-CM | POA: Diagnosis not present

## 2020-08-05 NOTE — Telephone Encounter (Signed)
Scheduled injection per sch msg. Called and left msg.

## 2020-08-05 NOTE — Progress Notes (Signed)
[facility] Progress Note   [name] [id number] 1951-05-11 69 y.o.  [name] is managed by Dr. Fanny Bien. [name]  Actively treated with chemotherapy/immunotherapy/hormonal therapy: yes  Current therapy: Carboplatin and etoposide with Fulphila support  Last treated: 07/19/2020 (cycle 3, day 1)  Next scheduled appointment with provider: 08/09/2020  Assessment: Plan:    Left cephalic vein thrombosis - Plan: RIVAROXABAN (XARELTO) VTE STARTER PACK (15 & 20 MG TABLETS)  Small cell lung cancer, right middle lobe ([place])   Superficial thrombus of the left cephalic vein: The patient was given a prescription for a Xarelto starter pack.  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  Please see After Visit Summary for patient specific instructions.  Future Appointments  Date Time Provider [place]  08/08/2020 10:45 AM [facility] LINAC 3 [facility] None  08/09/2020 10:30 AM [facility] LAB [facility] None  08/09/2020 11:00 AM Curt Bears, MD [facility] None  08/09/2020 12:00 PM [facility] INFUSION [facility] None  08/10/2020  2:15 PM [facility] INFUSION [facility] None  08/11/2020  2:00 PM [facility] INFUSION [facility] None  08/12/2020  3:45 PM [facility] Bonnie FLUSH [facility] None  08/29/2020  8:00 AM Mahala Menghini, PA-C [facility]GA RGA  09/12/2020 11:45 AM [name], MD [facility] None  11/23/2020 11:30 AM Patwardhan, Reynold Bowen, MD [facility] None  12/14/2020 10:00 AM Mahala Menghini, PA-C [facility]RGA RGA    No orders of the defined types were placed in this encounter.      Subjective:   Patient ID:  [name] is a 69 y.o. (DOB  [birth year]) female.  Chief Complaint: No chief complaint on file.   HPI [name] is a 69 y.o. female with a diagnosis of an extensive stage small cell carcinoma of the lung with a right middle lobe mass, right hilar and low right paratracheal adenopathy in addition to a solitary brain metastasis at the time of the diagnosis in August 2021: She is managed by Dr. [name] and is status post cycle 3, day 1 of carboplatin and etoposide with Fulphila support with cycle 3 begun on 07/19/2020.  She presents today with a new onset of tenderness and swelling over her left anterior bicep.  She denies any change in activity or trauma.  She denies any other issues of concern.  She was referred for a Doppler ultrasound which returned showing:  No evidence of deep vein thrombosis in the upper extremity.  Findings consistent with age indeterminate superficial vein thrombosis involving the left cephalic vein.  Medications: I have reviewed the patient's current medications.  Allergies:  Allergies  Allergen Reactions  . Iohexol      Desc: CHEST TIGHTNESS,BRETHING PROBLEMS NEEDS PRE MEDS 13 hour premeds given 06/08/2020 without incident   . Tape Other (See Comments)    Tears skin  . Ciprofloxacin Rash  . Latex Rash    GLOVES   . Penicillins Swelling    Did it involve swelling of the face/tongue/throat, SOB, or low BP? Yes Did it involve sudden or severe rash/hives, skin peeling, or any reaction on the inside of your mouth or nose? No Did you need to seek medical attention at a hospital or doctor's office? Yes  When did it last happen?1996 If all above answers are "NO", may proceed with cephalosporin use.  . Povidone-Iodine Rash    Past Medical History:  Diagnosis Date  . Allergic rhinitis   . 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 Anemia   . Anxiety   . ASCVD (arteriosclerotic cardiovascular disease)    MI in 96 requiring BMS CX; DES to M1 in 2000;normal coronary  angiography in 2004  . Cholelithiasis   . COPD (chronic obstructive pulmonary disease) ([place])   . DDD (degenerative disc disease), lumbar   . Depression   . Diabetes mellitus    Type II  . Diarrhea   . DVT (deep venous thrombosis) ([place])    patient said no  . GERD (gastroesophageal reflux disease)   . Headache    migraine  . History of kidney stones    2006  . Hyperlipidemia   . Hypotension   . Hypothyroidism   . Low back pain   . Myocardial infarction ([place]) 01/1995  . Nephrolithiasis 2006   stone extraction   . Panic attacks   . Peripheral neuropathy   . Peripheral vascular disease ([facility])    legs  . Pneumonia 12/22/2019  . Sciatic pain    right  . Tobacco abuse   . Tremor     Past Surgical History:  Procedure Laterality Date  . AGILE CAPSULE N/A 03/07/2020   Procedure: AGILE CAPSULE;  Surgeon: Daneil Dolin, MD;  [facility];  Service: Endoscopy;  Laterality: N/A;  7:30am  . BACK SURGERY     fusion  . BRONCHIAL NEEDLE ASPIRATION BIOPSY N/A 05/20/2020   Procedure: BRONCHIAL NEEDLE ASPIRATION BIOPSIES;  Surgeon: Collene Gobble, MD;  Location: [facility] ENDOSCOPY;  Service: Pulmonary;  Laterality: N/A;  . CHOLECYSTECTOMY    . COLONOSCOPY  01/2009   KKX:FGHWEX rectum/repeat in 5 yrs  . COLONOSCOPY N/A 04/29/2014   Dr.Rourk- attempted/incomplete colonoscopy. inadequate prep  . COLONOSCOPY N/A 05/27/2014   HBZ:JIRCVELFY coli. Colonic polyps-removed as described above.Status post segmental biopsy. single tubular adenoma and random colon bx neg  . COLONOSCOPY WITH PROPOFOL N/A 12/23/2019   Procedure: COLONOSCOPY WITH PROPOFOL;  Surgeon: Ronnette Juniper, MD;  Location: [place];  Service: Gastroenterology;  Laterality: N/A;  . DILATION AND CURETTAGE OF UTERUS  1974  . ESOPHAGOGASTRODUODENOSCOPY  05/2010   Dr. Tessie Fass, erosion. 77F dilation  . ESOPHAGOGASTRODUODENOSCOPY N/A 04/29/2014   Dr.Rourk- normal esophagus s/p passage of maloney dilator. small hiatal hernia- bx=  chronic inflammation.  . ESOPHAGOGASTRODUODENOSCOPY (EGD) WITH PROPOFOL N/A 12/23/2019   Procedure: ESOPHAGOGASTRODUODENOSCOPY (EGD) WITH PROPOFOL;  Surgeon: Ronnette Juniper, MD;  Location: [place];  Service: Gastroenterology;  Laterality: N/A;  . GIVENS CAPSULE STUDY N/A 03/22/2020   Procedure: GIVENS CAPSULE STUDY;  Surgeon: Daneil Dolin, MD;  [facility];  Service: Endoscopy;  Laterality: N/A;  7:30am  . HEMOSTASIS CLIP PLACEMENT  12/23/2019   Procedure: HEMOSTASIS CLIP PLACEMENT;  Surgeon: Ronnette Juniper, MD;  Location: [place];  Service: Gastroenterology;;  . HOT HEMOSTASIS N/A 12/23/2019   Procedure: HOT HEMOSTASIS (ARGON PLASMA COAGULATION/BICAP);  Surgeon: Ronnette Juniper, MD;  Location: [place];  Service: Gastroenterology;  Laterality: N/A;  . LEFT HEART CATH AND CORONARY ANGIOGRAPHY N/A 12/24/2019   Procedure: LEFT HEART CATH AND CORONARY ANGIOGRAPHY;  Surgeon: Nigel Mormon, MD;  Location: [place] [facility];  Service: Cardiovascular;  Laterality: N/A;  . MALONEY DILATION N/A 04/29/2014   Procedure: Venia Minks DILATION;  Surgeon: Cristopher Estimable  Rourk, MD;  [facility];  Service: Endoscopy;  Laterality: N/A;  . PARTIAL HYSTERECTOMY  1978  . POLYPECTOMY  12/23/2019   Procedure: POLYPECTOMY;  Surgeon: Ronnette Juniper, MD;  Location: [facility] END[facility]COPY;  Service: Gastroenterology;;  . SHOULDER SURGERY Left    Left shoulder for RTC;left arm surgery '98/left hand surgery 2001  . TOTAL ABDOMINAL HYSTERECTOMY W/ BILATERAL SALPINGOOPHORECTOMY  2002  . UMBILICAL HERNIA REPAIR  2008  . VIDEO BRONCHOSCOPY WITH ENDOBRONCHIAL ULTRASOUND N/A 05/20/2020   Procedure: VIDEO BRONCHOSCOPY WITH ENDOBRONCHIAL ULTRASOUND;  Surgeon: Collene Gobble, MD;  Location: [facility] ENDOSCOPY;  Service: Pulmonary;  Laterality: N/A;    Family History  Problem Relation Age of Onset  . Depression Mother   . Bipolar disorder Mother   . Dementia Mother   . Pulmonary fibrosis Mother   . Alcohol abuse Father   .  Aneurysm Father        deceased age 36, brain  . Colon cancer Paternal Grandfather        age greater than 20    Social History   Socioeconomic History  . Marital status: Divorced    Spouse name: Not on file  . Number of children: 2  . Years of education: Not on file  . Highest education level: Not on file  Occupational History  . 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 Smoking status: Former Smoker    Packs/day: 0.50    Years: 40.00    Pack years: 20.00    Types: Cigarettes    Quit date: 08/30/2017    Years since quitting: 2.9  . Smokeless tobacco: Never Used  Vaping Use  . Vaping Use: Never used  Substance and Sexual Activity  . Alcohol use: No  . Drug use: No  . Sexual activity: Not Currently  Other Topics Concern  . Not on file  Social History Narrative   Married x3   Lives with husband   GED in 2005   Social Determinants of Health   Financial Resource Strain:   . Difficulty of Paying Living Expenses: Not on file  Food Insecurity:   . Worried About Charity fundraiser in the Last Year: Not on file  . Ran Out of Food in the Last Year: Not on file  Transportation Needs:   . Lack of Transportation (Medical): Not on file  . Lack of Transportation (Non-Medical): Not on file  Physical Activity:   . Days of Exercise per Week: Not on file  . Minutes of Exercise per Session: Not on file  Stress:   . Feeling of Stress : Not on file  Social Connections:   . Frequency of Communication with Friends and Family: Not on file  . Frequency of Social Gatherings with Friends and Family: Not on file  . Attends Religious Services: Not on file  . Active Member of Clubs or Organizations: Not on file  . Attends Archivist Meetings: Not on file  . Marital Status: Not on file  Intimate Partner Violence:   . Fear of Current or Ex-Partner: Not on file  . Emotionally Abused: Not on file  .  Physically Abused: Not on file  . Sexually Abused: Not on file    Past Medical History, Surgical history, Social history, and Family history were reviewed and updated as appropriate.   Please see review of systems for further details on the patient's review from today.  Review of Systems:  Review of Systems  Constitutional: Negative for chills, diaphoresis and fever.  HENT: Negative for trouble swallowing and voice change.   Respiratory: Negative for cough, chest tightness, shortness of breath and wheezing.   Cardiovascular: Negative for chest pain and palpitations.  Gastrointestinal: Negative for abdominal pain, constipation, diarrhea, nausea and vomiting.  Musculoskeletal: Negative for back pain and myalgias.       Tenderness and swelling over the left anterior biceps  Neurological: Negative for dizziness, light-headedness and headaches.    Objective:   Physical Exam:  There were no vitals taken for this visit. ECOG: 0  Physical Exam Constitutional:      General: She is not in acute distress.    Appearance: Normal appearance. She is not ill-appearing.  Eyes:     General: No scleral icterus.       Right eye: No discharge.        Left eye: No discharge.     Conjunctiva/sclera: Conjunctivae normal.  Musculoskeletal:        General: Swelling, tenderness and deformity present.     Comments: Tenderness and swelling over the left anterior biceps. The right hand is absent.  Skin:    General: Skin is warm and dry.     Findings: No erythema or rash.  Neurological:     Mental Status: She is alert.     Coordination: Coordination normal.     Gait: Gait normal.  Psychiatric:        Mood and Affect: Mood normal.        Behavior: Behavior normal.        Thought Content: Thought content normal.        Judgment: Judgment normal.     Lab Review:     Component Value Date/Time   NA 136 08/02/2020 1104   NA 138 05/25/2020 0806   K 5.2 (H) 08/02/2020 1104   CL 105 08/02/2020  1104   CO2 26 08/02/2020 1104   GLUCOSE 96 08/02/2020 1104   BUN 16 08/02/2020 1104   BUN 26 05/25/2020 0806   CREATININE 1.12 (H) 08/02/2020 1104   CREATININE 0.85 05/03/2012 0942   CALCIUM 9.6 08/02/2020 1104   PROT 7.0 08/02/2020 1104   PROT 6.9 05/25/2020 0806   ALBUMIN 3.6 08/02/2020 1104   ALBUMIN 3.6 (L) 05/25/2020 0806   AST 19 08/02/2020 1104   ALT 27 08/02/2020 1104   ALKPHOS 95 08/02/2020 1104   BILITOT 0.2 (L) 08/02/2020 1104   GFRNONAA 50 (L) 08/02/2020 1104   GFRAA 58 (L) 07/05/2020 1137       Component Value Date/Time   WBC 6.9 08/02/2020 1104   WBC 8.1 05/20/2020 0710   RBC 2.64 (L) 08/02/2020 1104   HGB 8.7 (L) 08/02/2020 1104   HGB 10.5 (L) 05/18/2020 0830   HCT 26.9 (L) 08/02/2020 1104   HCT 32.7 (L) 05/18/2020 0830   PLT 33 (L) 08/02/2020 1104   PLT 280 05/18/2020 0830   MCV 101.9 (H) 08/02/2020 1104   MCV 92 05/18/2020 0830   MCH 33.0 08/02/2020 1104   MCHC 32.3 08/02/2020 1104   RDW 23.1 (H) 08/02/2020 1104   RDW 12.4 05/18/2020 0830   LYMPHSABS 0.8 08/02/2020 1104   LYMPHSABS 1.7 05/18/2020 0830   MONOABS 0.5 08/02/2020 1104   EOSABS 0.0 08/02/2020 1104   EOSABS 0.2 05/18/2020 0830   BASOSABS 0.0 08/02/2020 1104   BASOSABS 0.0 05/18/2020 0830   -------------------------------  Imaging from last 24 hours (if applicable):  Radiology interpretation: CT Chest W Contrast  Result Date: 07/14/2020 CLINICAL DATA:  Small-cell lung cancer restaging EXAM: CT CHEST WITH CONTRAST TECHNIQUE: Multidetector CT imaging of the chest was performed during intravenous contrast administration. CONTRAST:  33m OMNIPAQUE IOHEXOL 300 MG/ML  SOLN COMPARISON:  CT chest, 05/12/2020, PET-CT, 05/02/2020 FINDINGS: Cardiovascular: Aortic arthrosclerosis. Normal heart size. Three-vessel coronary artery calcifications. No pericardial effusion. Mediastinum/Nodes: Interval reduction in size of enlarged, FDG avid pretracheal and right hilar lymph nodes, index pretracheal node  measuring 1.2 x 1.0 cm, previously 1.6 x 1.5 cm (series 2, image 43). Thyroid gland, trachea, and esophagus demonstrate no significant findings. Lungs/Pleura: Interval decrease in size of a mass of the lateral segment right middle lobe measuring approximately 3.5 x 3.5 cm, previously at least 5.4 x 4.4 cm (series 2, image 77). Mild centrilobular and paraseptal emphysema. Extensive peripheral irregular interstitial opacity in a pattern without clear apical to basal gradient. No pleural effusion or pneumothorax. Upper Abdomen: No acute abnormality. Musculoskeletal: No chest wall mass or suspicious bone lesions identified. IMPRESSION: 1. Interval decrease in size of an FDG avid mass of the lateral segment right middle lobe. 2. Interval reduction in size of enlarged, FDG avid pretracheal and right hilar lymph nodes. 3. Findings are consistent with treatment response of primary lung mass and nodal metastatic disease. 4. Extensive peripheral irregular interstitial opacity in a pattern without clear apical to basal gradient, consistent with mild pulmonary fibrosis, in an "indeterminate for UIP" pattern if characterized by ATS pulmonary fibrosis criteria. Attention on follow-up for stability of fibrotic findings and pattern. 5. Emphysema (ICD10-J43.9). 6. Coronary artery disease. Aortic Atherosclerosis (ICD10-I70.0). Electronically Signed   By: AEddie CandleM.D.   On: 07/14/2020 11:52   VAS [place]PPER EXTREMITY VENOUS DUPLEX  Result Date: 08/03/2020 UPPER VENOUS STUDY  Indications: Pain Comparison Study: no prior Performing Technologist: MAbram SanderRVS  Examination Guidelines: A complete evaluation includes B-mode imaging, spectral Doppler, color Doppler, and power Doppler as needed of all accessible portions of each vessel. Bilateral testing is considered an integral part of a complete examination. Limited examinations for reoccurring indications may be performed as noted.  Left Findings:  +----------+------------+---------+-----------+----------+-----------------+ LEFT      CompressiblePhasicitySpontaneousProperties     Summary      +----------+------------+---------+-----------+----------+-----------------+ IJV           Full       Yes       Yes                                +----------+------------+---------+-----------+----------+-----------------+ Subclavian    Full       Yes       Yes                                +----------+------------+---------+-----------+----------+-----------------+ Axillary      Full       Yes       Yes                                +----------+------------+---------+-----------+----------+-----------------+ Brachial      Full       Yes       Yes                                +----------+------------+---------+-----------+----------+-----------------+  Radial        Full                                                    +----------+------------+---------+-----------+----------+-----------------+ Ulnar         Full                                                    +----------+------------+---------+-----------+----------+-----------------+ Cephalic      None                                  Age Indeterminate +----------+------------+---------+-----------+----------+-----------------+ Basilic       Full                                                    +----------+------------+---------+-----------+----------+-----------------+  Summary:  Left: No evidence of deep vein thrombosis in the upper extremity. Findings consistent with age indeterminate superficial vein thrombosis involving the left cephalic vein.  *See table(s) above for measurements and observations.  Diagnosing physician: Harold Barban MD Electronically signed by Harold Barban MD on 08/03/2020 at 7:39:24 PM.    Final         This case was discussed with Dr. [name]. He expressed agreement with my management of this patient.

## 2020-08-08 ENCOUNTER — Encounter: Payer: Self-pay | Admitting: Radiation Oncology

## 2020-08-08 ENCOUNTER — Ambulatory Visit
Admission: RE | Admit: 2020-08-08 | Discharge: 2020-08-08 | Disposition: A | Discharge status: Home / Self Care | Payer: Medicare HMO | Source: Ambulatory Visit | Attending: Radiation Oncology | Admitting: Radiation Oncology

## 2020-08-08 ENCOUNTER — Ambulatory Visit: Payer: Medicare HMO

## 2020-08-08 DIAGNOSIS — Z510 Encounter for antineoplastic radiation therapy: Secondary | ICD-10-CM | POA: Diagnosis not present

## 2020-08-09 ENCOUNTER — Ambulatory Visit: Payer: Medicare HMO

## 2020-08-09 ENCOUNTER — Encounter: Payer: Self-pay | Admitting: Internal Medicine

## 2020-08-09 ENCOUNTER — Inpatient Hospital Stay (HOSPITAL_BASED_OUTPATIENT_CLINIC_OR_DEPARTMENT_OTHER): Payer: Medicare HMO | Admitting: Internal Medicine

## 2020-08-09 ENCOUNTER — Inpatient Hospital Stay: Payer: Medicare HMO

## 2020-08-09 ENCOUNTER — Other Ambulatory Visit: Payer: Self-pay

## 2020-08-09 VITALS — BP 111/60 | HR 86 | Temp 97.8°F | Resp 18 | Ht 66.0 in | Wt 143.7 lb

## 2020-08-09 DIAGNOSIS — C342 Malignant neoplasm of middle lobe, bronchus or lung: Secondary | ICD-10-CM

## 2020-08-09 DIAGNOSIS — Z5111 Encounter for antineoplastic chemotherapy: Secondary | ICD-10-CM

## 2020-08-09 DIAGNOSIS — C3490 Malignant neoplasm of unspecified part of unspecified bronchus or lung: Secondary | ICD-10-CM | POA: Diagnosis not present

## 2020-08-09 LAB — CBC WITH DIFFERENTIAL (CANCER CENTER ONLY)
Abs Immature Granulocytes: 0.02 10*3/uL (ref 0.00–0.07)
Basophils Absolute: 0 10*3/uL (ref 0.0–0.1)
Basophils Relative: 0 %
Eosinophils Absolute: 0.1 10*3/uL (ref 0.0–0.5)
Eosinophils Relative: 1 %
HCT: 25.3 % — ABNORMAL LOW (ref 36.0–46.0)
Hemoglobin: 8.4 g/dL — ABNORMAL LOW (ref 12.0–15.0)
Immature Granulocytes: 0 %
Lymphocytes Relative: 13 %
Lymphs Abs: 0.9 10*3/uL (ref 0.7–4.0)
MCH: 33.9 pg (ref 26.0–34.0)
MCHC: 33.2 g/dL (ref 30.0–36.0)
MCV: 102 fL — ABNORMAL HIGH (ref 80.0–100.0)
Monocytes Absolute: 0.4 10*3/uL (ref 0.1–1.0)
Monocytes Relative: 6 %
Neutro Abs: 5.5 10*3/uL (ref 1.7–7.7)
Neutrophils Relative %: 80 %
Platelet Count: 94 10*3/uL — ABNORMAL LOW (ref 150–400)
RBC: 2.48 MIL/uL — ABNORMAL LOW (ref 3.87–5.11)
RDW: 23.4 % — ABNORMAL HIGH (ref 11.5–15.5)
WBC Count: 6.9 10*3/uL (ref 4.0–10.5)
nRBC: 0 % (ref 0.0–0.2)

## 2020-08-09 LAB — CMP (CANCER CENTER ONLY)
ALT: 19 U/L (ref 0–44)
AST: 14 U/L — ABNORMAL LOW (ref 15–41)
Albumin: 3.7 g/dL (ref 3.5–5.0)
Alkaline Phosphatase: 93 U/L (ref 38–126)
Anion gap: 8 (ref 5–15)
BUN: 20 mg/dL (ref 8–23)
CO2: 25 mmol/L (ref 22–32)
Calcium: 9.9 mg/dL (ref 8.9–10.3)
Chloride: 104 mmol/L (ref 98–111)
Creatinine: 1.13 mg/dL — ABNORMAL HIGH (ref 0.44–1.00)
GFR, Estimated: 53 mL/min — ABNORMAL LOW (ref >60)
Glucose, Bld: 98 mg/dL (ref 70–99)
Potassium: 4.8 mmol/L (ref 3.5–5.1)
Sodium: 137 mmol/L (ref 135–145)
Total Bilirubin: 0.3 mg/dL (ref 0.3–1.2)
Total Protein: 7.2 g/dL (ref 6.5–8.1)

## 2020-08-09 NOTE — Progress Notes (Signed)
[place] [phone]   [phone]  OFFICE PROGRESS NOTE  [name], MD [address] [place] [address]  DIAGNOSIS: Extensive stage (T3, N2, M1c) small cell lung cancer presented with right middle lobe lung mass in addition to right hilar and low right paratracheal adenopathy in addition to tiny solitary brain metastasis diagnosed in August 2021.  PRIOR THERAPY:None  CURRENT THERAPY: Palliative systemic chemotherapy with carboplatin for AUC of 5 on day 1, etoposide 100 mg/M2 on days 1, 2 and 3 with Neulasta support.    Status post 2 cycles.  This is concurrent with radiotherapy which was completed.  INTERVAL HISTORY: [name] 69 y.o. female returns to the clinic today for follow-up visit.  The patient is feeling fine today with no concerning complaints except for fatigue.  She denied having any chest pain, shortness of breath except with exertion with no cough or hemoptysis.  She has no nausea, vomiting, diarrhea or constipation.  She denied having any headache or visual changes.  She has no recent weight loss or night sweats.  She is tolerating her systemic chemotherapy fairly well.  She was supposed to start cycle #4 today.  MEDICAL HISTORY: Past Medical History:  Diagnosis Date  . Allergic rhinitis   . 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 Anemia   . Anxiety   . ASCVD (arteriosclerotic cardiovascular disease)    MI in 96 requiring BMS CX; DES to M1 in 2000;normal coronary angiography in 2004  . Cholelithiasis   . COPD (chronic obstructive pulmonary disease) ([place])   . DDD (degenerative disc disease), lumbar   . Depression   . Diabetes mellitus    Type II  . Diarrhea   . DVT (deep venous thrombosis) ([place])    patient said no  . GERD (gastroesophageal reflux disease)   . Headache    migraine  . History of kidney stones    2006  . Hyperlipidemia   . Hypotension   . Hypothyroidism   . Low back pain   .  Myocardial infarction ([place]) 01/1995  . Nephrolithiasis 2006   stone extraction   . Panic attacks   . Peripheral neuropathy   . Peripheral vascular disease ([facility])    legs  . Pneumonia 12/22/2019  . Sciatic pain    right  . Tobacco abuse   . Tremor     ALLERGIES:  is allergic to iohexol, tape, ciprofloxacin, latex, penicillins, and povidone-iodine.  MEDICATIONS:  Current Outpatient Medications  Medication Sig Dispense Refill  . albuterol (PROVENTIL HFA;VENTOLIN HFA) 108 (90 BASE) MCG/ACT inhaler Inhale 2 puffs into the lungs every 4 (four) hours as needed for wheezing or shortness of breath. 1 Inhaler 0  . aspirin-acetaminophen-caffeine (EXCEDRIN MIGRAINE) 250-250-65 MG tablet Take 2 tablets by mouth daily as needed for headache. Up to 4 times a week    . azelastine (OPTIVAR) 0.05 % ophthalmic solution Place 1 drop into both eyes 2 (two) times daily.    Marland Kitchen b complex vitamins tablet Take 1 tablet by mouth daily. With Zinc and vitamin C    . Benzocaine (BOIL-EASE EX) Apply 1 application topically daily as needed (Boil).    . Biotin 5000 MCG TABS Take 10,000 mcg by mouth daily. Keratin    . carbamide peroxide (DEBROX) 6.5 % OTIC solution 5 drops daily as needed.    . Cholecalciferol (VITAMIN D3) 50 MCG (2000 UT) TABS Take 4,000 Units  by mouth daily.    . colestipol (COLESTID) 5 g packet Take 3 g by mouth daily. Do not take within 2 hours of other medications. 18 packet 0  . Cyanocobalamin 2500 MCG CHEW Chew 2,500 mcg by mouth daily.    . diphenhydrAMINE (BENADRYL) 50 MG capsule Take 84m 1 hour before scheduled CT 1 capsule 0  . ferrous sulfate 325 (65 FE) MG tablet Take 325 mg by mouth daily with breakfast.    . Fluticasone-Umeclidin-Vilant (TRELEGY ELLIPTA) 100-62.5-25 MCG/INH AEPB Inhale 1 puff into the lungs daily. 28 each 5  . furosemide (LASIX) 40 MG tablet Take 1 tablet (40 mg total) by mouth 2 (two) times daily. (Patient taking differently: Take 40 mg by mouth daily. ) 30 tablet 1    . Ginkgo Biloba Extract (GNP GINGKO BILOBA EXTRACT) 60 MG CAPS Take 2 capsules by mouth daily.     . hydrocortisone cream 1 % Apply 1 application topically daily as needed for itching.    .Marland KitchenHydrocortisone, Perianal, (PROCTO-PAK) 1 % CREA Apply anorectally twice daily for two weeks. 28 g 0  . ibuprofen (ADVIL) 200 MG tablet Take 200-600 mg by mouth every 6 (six) hours as needed for moderate pain.    .Marland KitchenLANTUS SOLOSTAR 100 UNIT/ML Solostar Pen Inject 16 Units into the skin daily.     .Marland Kitchenlevothyroxine (SYNTHROID) 100 MCG tablet Take 112 mcg by mouth daily before breakfast.     . lisinopril (ZESTRIL) 20 MG tablet Take 20 mg by mouth at bedtime.    .Marland KitchenLORazepam (ATIVAN) 0.5 MG tablet Take 1 tablet 30 minutes before the MRI.  Repeat once if needed. 2 tablet 0  . Menthol, Topical Analgesic, (ICY HOT) 7.5 % (Roll) MISC Apply 1 each topically daily as needed (Pain). spray     . metFORMIN (GLUCOPHAGE) 500 MG tablet Take 500 mg by mouth 2 (two) times daily with a meal.     . mirtazapine (REMERON) 15 MG tablet Take 7.5 mg by mouth at bedtime.    . Multiple Vitamins-Minerals (MULTIVITAMIN WITH MINERALS) tablet Take 1 tablet by mouth daily. Adult 50+    . neomycin-bacitracin-polymyxin (NEOSPORIN) OINT Apply 1 application topically daily as needed for irritation or wound care.    . Nitroglycerin 0.4 % OINT Place one inch anorectally with a gloved finger every 12 hours for up to 3 weeks for anal fissure. Apply with a gloved finger to prevent absorption of nitroglycerin through the skin which can lead to headache. 30 g 0  . Omega 3-6-9 Fatty Acids (OMEGA 3-6-9 COMPLEX PO) Take 2 tablets by mouth daily.    .Marland KitchenoxyCODONE-acetaminophen (PERCOCET/ROXICET) 5-325 MG tablet Take 0.5 tablets by mouth 2 (two) times daily as needed for severe pain.    . pantoprazole (PROTONIX) 40 MG tablet Take 40 mg by mouth daily.    .Marland KitchenPARoxetine (PAXIL) 20 MG tablet Take 20 mg by mouth daily.     . pravastatin (PRAVACHOL) 40 MG tablet Take  40 mg by mouth daily.    . predniSONE (DELTASONE) 50 MG tablet Take 50 mg 13 hours, 7 hours, and 2 hours before scheduled CT scan 3 tablet 0  . prochlorperazine (COMPAZINE) 10 MG tablet Take 1 tablet (10 mg total) by mouth every 6 (six) hours as needed. 30 tablet 2  . pyridOXINE (VITAMIN B-6) 50 MG tablet Take 200 mg by mouth daily.    .Marland KitchenRIVAROXABAN (XARELTO) VTE STARTER PACK (15 & 20 MG TABLETS) Follow package directions: Take one 162m  tablet by mouth twice a day. On day 22, switch to one 2m tablet once a day. Take with food.Call for refill 1 week before finishing starter pack. 51 each 0  . rosuvastatin (CRESTOR) 10 MG tablet Take 1 tablet (10 mg total) by mouth daily. 90 tablet 3  . sucralfate (CARAFATE) 1 g tablet Take 1 tablet (1 g total) by mouth 4 (four) times daily -  with meals and at bedtime. Crush and dissolve in 15 mL of warm water prior to swallowing 120 tablet 1  . traZODone (DESYREL) 150 MG tablet Take 150 mg by mouth at bedtime.      No current facility-administered medications for this visit.    SURGICAL HISTORY:  Past Surgical History:  Procedure Laterality Date  . AGILE CAPSULE N/A 03/07/2020   Procedure: AGILE CAPSULE;  Surgeon: RDaneil Dolin MD;  [facility];  Service: Endoscopy;  Laterality: N/A;  7:30am  . BACK SURGERY     fusion  . BRONCHIAL NEEDLE ASPIRATION BIOPSY N/A 05/20/2020   Procedure: BRONCHIAL NEEDLE ASPIRATION BIOPSIES;  Surgeon: BCollene Gobble MD;  [facility];  Service: Pulmonary;  Laterality: N/A;  . CHOLECYSTECTOMY    . COLONOSCOPY  01/2009   RGTX:MIWOEHrectum/repeat in 5 yrs  . COLONOSCOPY N/A 04/29/2014   Dr.Rourk- attempted/incomplete colonoscopy. inadequate prep  . COLONOSCOPY N/A 05/27/2014   ROZY:YQMGNOIBBcoli. Colonic polyps-removed as described above.Status post segmental biopsy. single tubular adenoma and random colon bx neg  . COLONOSCOPY WITH PROPOFOL N/A 12/23/2019   Procedure: COLONOSCOPY WITH PROPOFOL;  Surgeon:  KRonnette Juniper MD;  [place] [facility]  Service: Gastroenterology;  Laterality: N/A;  . DILATION AND CURETTAGE OF UTERUS  1974  . ESOPHAGOGASTRODUODENOSCOPY  05/2010   Dr. RTessie Fass erosion. 25F dilation  . ESOPHAGOGASTRODUODENOSCOPY N/A 04/29/2014   Dr.Rourk- normal esophagus s/p passage of maloney dilator. small hiatal hernia- bx= chronic inflammation.  . ESOPHAGOGASTRODUODENOSCOPY (EGD) WITH PROPOFOL N/A 12/23/2019   Procedure: ESOPHAGOGASTRODUODENOSCOPY (EGD) WITH PROPOFOL;  Surgeon: KRonnette Juniper MD;  [place] [facility]  Service: Gastroenterology;  Laterality: N/A;  . GIVENS CAPSULE STUDY N/A 03/22/2020   Procedure: GIVENS CAPSULE STUDY;  Surgeon: RDaneil Dolin MD;  [facility];  Service: Endoscopy;  Laterality: N/A;  7:30am  . HEMOSTASIS CLIP PLACEMENT  12/23/2019   Procedure: HEMOSTASIS CLIP PLACEMENT;  Surgeon: KRonnette Juniper MD;  [place]  Service: Gastroenterology;;  . HOT HEMOSTASIS N/A 12/23/2019   Procedure: HOT HEMOSTASIS (ARGON PLASMA COAGULATION/BICAP);  Surgeon: KRonnette Juniper MD;  [place] [facility]  Service: Gastroenterology;  Laterality: N/A;  . LEFT HEART CATH AND CORONARY ANGIOGRAPHY N/A 12/24/2019   Procedure: LEFT HEART CATH AND CORONARY ANGIOGRAPHY;  Surgeon: PNigel Mormon MD;  [facility];  Service: Cardiovascular;  Laterality: N/A;  . MVenia MinksDILATION N/A 04/29/2014   Procedure: MVenia MinksDILATION;  Surgeon: RDaneil Dolin MD;  [facility];  Service: Endoscopy;  Laterality: N/A;  . PARTIAL HYSTERECTOMY  1978  . POLYPECTOMY  12/23/2019   Procedure: POLYPECTOMY;  Surgeon: KRonnette Juniper MD;  [facility];  Service: Gastroenterology;;  . SHOULDER SURGERY Left    Left shoulder for RTC;left arm surgery '98/left hand surgery 2001  . TOTAL ABDOMINAL HYSTERECTOMY W/ BILATERAL SALPINGOOPHORECTOMY  2002  . UMBILICAL HERNIA REPAIR  2008  . VIDEO BRONCHOSCOPY WITH ENDOBRONCHIAL ULTRASOUND N/A 05/20/2020    Procedure: VIDEO BRONCHOSCOPY WITH ENDOBRONCHIAL ULTRASOUND;  Surgeon: BCollene Gobble MD;  [facility];  Service: Pulmonary;  Laterality: N/A;    REVIEW OF SYSTEMS:  A comprehensive review of systems was negative except for: Constitutional: positive for fatigue   PHYSICAL EXAMINATION: General appearance: alert, cooperative, fatigued and no distress Head: Normocephalic, without obvious abnormality, atraumatic Neck: no adenopathy, no JVD, supple, symmetrical, trachea midline and thyroid not enlarged, symmetric, no tenderness/mass/nodules Lymph nodes: Cervical, supraclavicular, and axillary nodes normal. Resp: clear to auscultation bilaterally Back: symmetric, no curvature. ROM normal. No CVA tenderness. Cardio: regular rate and rhythm, S1, S2 normal, no murmur, click, rub or gallop GI: soft, non-tender; bowel sounds normal; no masses,  no organomegaly Extremities: extremities normal, atraumatic, no cyanosis or edema  ECOG PERFORMANCE STATUS: 1 - Symptomatic but completely ambulatory  Blood pressure 111/60, pulse 86, temperature 97.8 F (36.6 C), temperature source Tympanic, resp. rate 18, height 5' 6"  (1.676 m), weight 143 lb 11.2 oz (65.2 kg), SpO2 98 %.  LABORATORY DATA: Lab Results  Component Value Date   WBC 6.9 08/09/2020   HGB 8.4 (L) 08/09/2020   HCT 25.3 (L) 08/09/2020   MCV 102.0 (H) 08/09/2020   PLT 94 (L) 08/09/2020      Chemistry      Component Value Date/Time   NA 137 08/09/2020 1039   NA 138 05/25/2020 0806   K 4.8 08/09/2020 1039   CL 104 08/09/2020 1039   CO2 25 08/09/2020 1039   BUN 20 08/09/2020 1039   BUN 26 05/25/2020 0806   CREATININE 1.13 (H) 08/09/2020 1039   CREATININE 0.85 05/03/2012 0942      Component Value Date/Time   CALCIUM 9.9 08/09/2020 1039   ALKPHOS 93 08/09/2020 1039   AST 14 (L) 08/09/2020 1039   ALT 19 08/09/2020 1039   BILITOT 0.3 08/09/2020 1039       RADIOGRAPHIC STUDIES: CT Chest W Contrast  Result Date:  07/14/2020 CLINICAL DATA:  Small-cell lung cancer restaging EXAM: CT CHEST WITH CONTRAST TECHNIQUE: Multidetector CT imaging of the chest was performed during intravenous contrast administration. CONTRAST:  59m OMNIPAQUE IOHEXOL 300 MG/ML  SOLN COMPARISON:  CT chest, 05/12/2020, PET-CT, 05/02/2020 FINDINGS: Cardiovascular: Aortic arthrosclerosis. Normal heart size. Three-vessel coronary artery calcifications. No pericardial effusion. Mediastinum/Nodes: Interval reduction in size of enlarged, FDG avid pretracheal and right hilar lymph nodes, index pretracheal node measuring 1.2 x 1.0 cm, previously 1.6 x 1.5 cm (series 2, image 43). Thyroid gland, trachea, and esophagus demonstrate no significant findings. Lungs/Pleura: Interval decrease in size of a mass of the lateral segment right middle lobe measuring approximately 3.5 x 3.5 cm, previously at least 5.4 x 4.4 cm (series 2, image 77). Mild centrilobular and paraseptal emphysema. Extensive peripheral irregular interstitial opacity in a pattern without clear apical to basal gradient. No pleural effusion or pneumothorax. Upper Abdomen: No acute abnormality. Musculoskeletal: No chest wall mass or suspicious bone lesions identified. IMPRESSION: 1. Interval decrease in size of an FDG avid mass of the lateral segment right middle lobe. 2. Interval reduction in size of enlarged, FDG avid pretracheal and right hilar lymph nodes. 3. Findings are consistent with treatment response of primary lung mass and nodal metastatic disease. 4. Extensive peripheral irregular interstitial opacity in a pattern without clear apical to basal gradient, consistent with mild pulmonary fibrosis, in an "indeterminate for UIP" pattern if characterized by ATS pulmonary fibrosis criteria. Attention on follow-up for stability of fibrotic findings and pattern. 5. Emphysema (ICD10-J43.9). 6. Coronary artery disease. Aortic Atherosclerosis (ICD10-I70.0). Electronically Signed   By: AEddie CandleM.D.    On: 07/14/2020 11:52  VAS [place] UPPER EXTREMITY VENOUS DUPLEX  Result Date: 08/03/2020 UPPER VENOUS STUDY  Indications: Pain Comparison Study: no prior Performing Technologist: Abram Sander RVS  Examination Guidelines: A complete evaluation includes B-mode imaging, spectral Doppler, color Doppler, and power Doppler as needed of all accessible portions of each vessel. Bilateral testing is considered an integral part of a complete examination. Limited examinations for reoccurring indications may be performed as noted.  Left Findings: +----------+------------+---------+-----------+----------+-----------------+ LEFT      CompressiblePhasicitySpontaneousProperties     Summary      +----------+------------+---------+-----------+----------+-----------------+ IJV           Full       Yes       Yes                                +----------+------------+---------+-----------+----------+-----------------+ Subclavian    Full       Yes       Yes                                +----------+------------+---------+-----------+----------+-----------------+ Axillary      Full       Yes       Yes                                +----------+------------+---------+-----------+----------+-----------------+ Brachial      Full       Yes       Yes                                +----------+------------+---------+-----------+----------+-----------------+ Radial        Full                                                    +----------+------------+---------+-----------+----------+-----------------+ Ulnar         Full                                                    +----------+------------+---------+-----------+----------+-----------------+ Cephalic      None                                  Age Indeterminate +----------+------------+---------+-----------+----------+-----------------+ Basilic       Full                                                     +----------+------------+---------+-----------+----------+-----------------+  Summary:  Left: No evidence of deep vein thrombosis in the upper extremity. Findings consistent with age indeterminate superficial vein thrombosis involving the left cephalic vein.  *See table(s) above for measurements and observations.  Diagnosing physician: Harold Barban MD Electronically signed by Harold Barban MD on 08/03/2020 at 7:39:24 PM.    Final     ASSESSMENT AND PLAN: This is  a very pleasant 69 years old white female recently diagnosed with extensive stage small cell lung cancer (T3, N2, M1 C) presented with right middle lobe lung mass in addition to right hilar and low right paratracheal lymphadenopathy in addition to tiny solitary brain metastasis diagnosed in August 2021. The patient is currently undergoing systemic chemotherapy with carboplatin for AUC of 5 on day 1, etoposide 100 mg/M2 with Neulasta support.  Status post 3 cycles.  The patient continues to tolerate her treatment well with no concerning adverse effects. She was supposed to start cycle #4 today but unfortunately her platelets count are still low. I gave the patient the option of delaying her treatment by 1 week until improvement of her condition and she was in agreement with this plan. She will proceed with cycle #4 next week. I will see her back for follow-up visit in 4 weeks for evaluation with repeat CT scan of the chest for restaging of her disease. She was advised to call immediately if she has any concerning symptoms in the interval. The patient voices understanding of current disease status and treatment options and is in agreement with the current care plan.  All questions were answered. The patient knows to call the clinic with any problems, questions or concerns. We can certainly see the patient much sooner if necessary.  Disclaimer: This note was dictated with voice recognition software. Similar sounding words can inadvertently be  transcribed and may not be corrected upon review.

## 2020-08-10 ENCOUNTER — Ambulatory Visit: Payer: Medicare HMO

## 2020-08-11 ENCOUNTER — Ambulatory Visit: Payer: Medicare HMO

## 2020-08-12 ENCOUNTER — Ambulatory Visit: Payer: Medicare HMO | Admitting: Gastroenterology

## 2020-08-12 ENCOUNTER — Inpatient Hospital Stay: Payer: Medicare HMO

## 2020-08-16 ENCOUNTER — Inpatient Hospital Stay: Payer: Medicare HMO

## 2020-08-16 ENCOUNTER — Telehealth: Payer: Self-pay

## 2020-08-16 ENCOUNTER — Other Ambulatory Visit: Payer: Self-pay | Admitting: Medical

## 2020-08-16 ENCOUNTER — Other Ambulatory Visit: Payer: Self-pay

## 2020-08-16 ENCOUNTER — Inpatient Hospital Stay: Payer: Medicare HMO | Attending: Internal Medicine | Admitting: Medical

## 2020-08-16 VITALS — BP 159/137 | HR 76 | Temp 97.0°F | Resp 18 | Ht 66.0 in | Wt 144.6 lb

## 2020-08-16 DIAGNOSIS — C342 Malignant neoplasm of middle lobe, bronchus or lung: Secondary | ICD-10-CM

## 2020-08-16 DIAGNOSIS — N1831 Chronic kidney disease, stage 3a: Secondary | ICD-10-CM | POA: Diagnosis not present

## 2020-08-16 DIAGNOSIS — Z87891 Personal history of nicotine dependence: Secondary | ICD-10-CM | POA: Diagnosis not present

## 2020-08-16 DIAGNOSIS — D696 Thrombocytopenia, unspecified: Secondary | ICD-10-CM

## 2020-08-16 DIAGNOSIS — D649 Anemia, unspecified: Secondary | ICD-10-CM

## 2020-08-16 DIAGNOSIS — Z5111 Encounter for antineoplastic chemotherapy: Secondary | ICD-10-CM | POA: Diagnosis not present

## 2020-08-16 DIAGNOSIS — D631 Anemia in chronic kidney disease: Secondary | ICD-10-CM | POA: Diagnosis not present

## 2020-08-16 DIAGNOSIS — I10 Essential (primary) hypertension: Secondary | ICD-10-CM | POA: Diagnosis not present

## 2020-08-16 LAB — CMP (CANCER CENTER ONLY)
ALT: 13 U/L (ref 0–44)
AST: 15 U/L (ref 15–41)
Albumin: 3.6 g/dL (ref 3.5–5.0)
Alkaline Phosphatase: 70 U/L (ref 38–126)
Anion gap: 6 (ref 5–15)
BUN: 23 mg/dL (ref 8–23)
CO2: 25 mmol/L (ref 22–32)
Calcium: 9.6 mg/dL (ref 8.9–10.3)
Chloride: 104 mmol/L (ref 98–111)
Creatinine: 1.35 mg/dL — ABNORMAL HIGH (ref 0.44–1.00)
GFR, Estimated: 43 mL/min — ABNORMAL LOW (ref >60)
Glucose, Bld: 100 mg/dL — ABNORMAL HIGH (ref 70–99)
Potassium: 5.5 mmol/L — ABNORMAL HIGH (ref 3.5–5.1)
Sodium: 135 mmol/L (ref 135–145)
Total Bilirubin: 0.2 mg/dL — ABNORMAL LOW (ref 0.3–1.2)
Total Protein: 6.8 g/dL (ref 6.5–8.1)

## 2020-08-16 LAB — CBC WITH DIFFERENTIAL (CANCER CENTER ONLY)
Abs Immature Granulocytes: 0.01 10*3/uL (ref 0.00–0.07)
Basophils Absolute: 0 10*3/uL (ref 0.0–0.1)
Basophils Relative: 0 %
Eosinophils Absolute: 0.1 10*3/uL (ref 0.0–0.5)
Eosinophils Relative: 1 %
HCT: 23.4 % — ABNORMAL LOW (ref 36.0–46.0)
Hemoglobin: 7.5 g/dL — ABNORMAL LOW (ref 12.0–15.0)
Immature Granulocytes: 0 %
Lymphocytes Relative: 19 %
Lymphs Abs: 1 10*3/uL (ref 0.7–4.0)
MCH: 34.1 pg — ABNORMAL HIGH (ref 26.0–34.0)
MCHC: 32.1 g/dL (ref 30.0–36.0)
MCV: 106.4 fL — ABNORMAL HIGH (ref 80.0–100.0)
Monocytes Absolute: 0.7 10*3/uL (ref 0.1–1.0)
Monocytes Relative: 14 %
Neutro Abs: 3.5 10*3/uL (ref 1.7–7.7)
Neutrophils Relative %: 66 %
Platelet Count: 218 10*3/uL (ref 150–400)
RBC: 2.2 MIL/uL — ABNORMAL LOW (ref 3.87–5.11)
RDW: 24.2 % — ABNORMAL HIGH (ref 11.5–15.5)
WBC Count: 5.4 10*3/uL (ref 4.0–10.5)
nRBC: 0 % (ref 0.0–0.2)

## 2020-08-16 LAB — PREPARE RBC (CROSSMATCH)

## 2020-08-16 MED ORDER — CLONIDINE HCL 0.1 MG PO TABS
ORAL_TABLET | ORAL | Status: AC
Start: 2020-08-16 — End: ?
  Filled 2020-08-16: qty 1

## 2020-08-16 MED ORDER — CLONIDINE HCL 0.1 MG PO TABS
0.1000 mg | ORAL_TABLET | Freq: Once | ORAL | Status: AC
  Administered 2020-08-16: 0.1 mg via ORAL

## 2020-08-16 NOTE — Progress Notes (Signed)
These results were reviewed with the patient. She was told to increase fluids by 50% today. She will return tomorrow for chemo and 1 unit of PRBC's.  Sandi Mealy, MHS, PA-C

## 2020-08-16 NOTE — Telephone Encounter (Signed)
[facility] the cancer center called due to pt had a office visit with them, pt Hemoglobin: 7.5  And B/p: 159/137. Lucianne Lei PA would like to see if we can see her for her b/p

## 2020-08-16 NOTE — Telephone Encounter (Signed)
Can see her today, if she is available.

## 2020-08-17 ENCOUNTER — Inpatient Hospital Stay: Payer: Medicare HMO

## 2020-08-17 ENCOUNTER — Other Ambulatory Visit: Payer: Self-pay

## 2020-08-17 VITALS — BP 108/61 | HR 70 | Temp 97.7°F | Resp 18

## 2020-08-17 DIAGNOSIS — D649 Anemia, unspecified: Secondary | ICD-10-CM

## 2020-08-17 DIAGNOSIS — C342 Malignant neoplasm of middle lobe, bronchus or lung: Secondary | ICD-10-CM

## 2020-08-17 DIAGNOSIS — Z5111 Encounter for antineoplastic chemotherapy: Secondary | ICD-10-CM | POA: Diagnosis not present

## 2020-08-17 MED ORDER — ACETAMINOPHEN 325 MG PO TABS
650.0000 mg | ORAL_TABLET | Freq: Once | ORAL | Status: AC
  Administered 2020-08-17: 650 mg via ORAL

## 2020-08-17 MED ORDER — SODIUM CHLORIDE 0.9 % IV SOLN
Freq: Once | INTRAVENOUS | Status: AC
  Filled 2020-08-17: qty 250

## 2020-08-17 MED ORDER — SODIUM CHLORIDE 0.9 % IV SOLN
10.0000 mg | Freq: Once | INTRAVENOUS | Status: AC
  Administered 2020-08-17: 10 mg via INTRAVENOUS
  Filled 2020-08-17: qty 10

## 2020-08-17 MED ORDER — SODIUM CHLORIDE 0.9 % IV SOLN
150.0000 mg | Freq: Once | INTRAVENOUS | Status: AC
  Administered 2020-08-17: 150 mg via INTRAVENOUS
  Filled 2020-08-17: qty 150

## 2020-08-17 MED ORDER — ACETAMINOPHEN 325 MG PO TABS
ORAL_TABLET | ORAL | Status: AC
  Filled 2020-08-17: qty 2

## 2020-08-17 MED ORDER — RIVAROXABAN 20 MG PO TABS
20.0000 mg | ORAL_TABLET | Freq: Every day | ORAL | 4 refills | Status: DC
Start: 2020-08-17 — End: 2020-10-17

## 2020-08-17 MED ORDER — PALONOSETRON HCL INJECTION 0.25 MG/5ML
0.2500 mg | Freq: Once | INTRAVENOUS | Status: AC
  Administered 2020-08-17: 0.25 mg via INTRAVENOUS

## 2020-08-17 MED ORDER — SODIUM CHLORIDE 0.9 % IV SOLN
75.0000 mg/m2 | Freq: Once | INTRAVENOUS | Status: AC
  Administered 2020-08-17: 130 mg via INTRAVENOUS
  Filled 2020-08-17: qty 6.5

## 2020-08-17 MED ORDER — PALONOSETRON HCL INJECTION 0.25 MG/5ML
INTRAVENOUS | Status: AC
  Filled 2020-08-17: qty 5

## 2020-08-17 MED ORDER — SODIUM CHLORIDE 0.9 % IV SOLN
340.0000 mg | Freq: Once | INTRAVENOUS | Status: AC
  Administered 2020-08-17: 340 mg via INTRAVENOUS
  Filled 2020-08-17: qty 34

## 2020-08-17 NOTE — Progress Notes (Signed)
[facility] Progress Note   [name] [id number] 11/06/1950 69 y.o.  [name] is managed by Dr. Fanny Bien. [name]  Actively treated with chemotherapy/immunotherapy/hormonal therapy: yes  Current therapy: Carboplatin and etoposide with Fulphila support  Last treated: 07/19/2020 (cycle 3, day 1)  Next scheduled appointment with provider: Follow-up to be scheduled  Assessment: Plan:    Thrombocytopenia ([place])  Small cell lung cancer, right middle lobe (HCC)  Stage 3a chronic kidney disease ([place])  Primary hypertension - Plan: cloNIDine (CATAPRES) tablet 0.1 mg, cloNIDine (CATAPRES) 0.1 MG tablet  Symptomatic anemia - Plan: Sample to Blood Bank, Informed Consent Details: Physician/Practitioner Attestation; Transcribe to consent form and obtain patient signature, Type and screen, Care order/instruction, heparin lock flush 100 unit/mL, sodium chloride flush (NS) 0.9 % injection 10 mL, DISCONTINUED: acetaminophen (TYLENOL) tablet 650 mg, CANCELED: Prepare RBC (crossmatch), CANCELED: Transfuse RBC   History of thrombocytopenia: A CBC from today showed that the patient's platelet count had normalized at 218.  This was up from 94 when checked 8 days ago when she presented for consideration of chemotherapy.  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 Fanny Bien. [name] and presents today prior to cycle 4, day 1 of carboplatin and etoposide which will be dosed tomorrow.  Primary hypertension: The patient's blood pressure was checked and was found to be 159/137.  She was given clonidine 0.1 mg p.o. x1.  Her cardiologist Dr. Vernell Leep was contacted.  His nurse states that he will call her either today or tomorrow regarding her blood pressure.  Her blood pressure was rechecked before leaving and returned at 160/106.  Symptomatic anemia in the setting of a grade 3A chronic kidney disease: A CBC returned  today with a hemoglobin of 7.5.  Her chemistries returned showing a creatinine of 1.35.  She will return tomorrow and will be given 1 unit of packed red blood cells prior to her chemotherapy.  She will follow-up with her primary care provider as scheduled.  Please see After Visit Summary for patient specific instructions.  Future Appointments  Date Time Provider [address]  08/18/2020  2:00 PM [facility] INFUSION [facility] None  08/19/2020  2:00 PM [facility] INFUSION [facility] None  08/22/2020 11:00 AM [facility] [place] FLUSH [facility] None  08/29/2020  8:00 AM Mahala Menghini, PA-C [facility]GA  09/12/2020 11:45 AM [name], MD [facility] None  11/23/2020 11:30 AM Patwardhan, Reynold Bowen, MD [facility] None  12/14/2020 10:00 AM Mahala Menghini, PA-C [facility] RGA    Orders Placed This Encounter  Procedures  . Informed Consent Details: Physician/Practitioner Attestation; Transcribe to consent form and obtain patient signature  . Care order/instruction  . Sample to Blood Bank  . Type and screen       Subjective:   Patient ID:  [name] is a 69 y.o. (DOB Jul 01, 1951) female.  Chief Complaint: No chief complaint on file.   HPI [name] is a 69 y.o. female with a diagnosis of an extensive stage small cell carcinoma of the lung.  She is followed by Dr. Fanny Bien. [name] and and is status post cycle 3, day 1 of carboplatin and etoposide which was dosed on 07/19/2020.  The patient presents to the clinic today as a walk-in.  She was last seen 1 week ago.  She was found to have a platelet count of 94 and had her treatment held.  She is recently been found to  have a DVT in her left upper extremity and has been placed on Xarelto.  She reports that she has not been called regarding a rescheduling of her chemotherapy.  She has stopped her "ulcer medications" which were given to her by Dr. [name] for her sore throat.  She continues on lisinopril 20 mg once daily which was given to  her by her cardiologist Dr. Vernell Leep.  She is having dizziness and a chronic dry cough.  She is concerned that her dry cough could be caused by lisinopril.  She denies GI bleeding, fevers, chills, sweats, headache, nausea, vomiting, constipation, or diarrhea.  She presents with her caregiver.    Medications: I have reviewed the patient's current medications.  Allergies:  Allergies  Allergen Reactions  . Iohexol      Desc: CHEST TIGHTNESS,BRETHING PROBLEMS NEEDS PRE MEDS 13 hour premeds given 06/08/2020 without incident   . Tape Other (See Comments)    Tears skin  . Ciprofloxacin Rash  . Latex Rash    GLOVES   . Penicillins Swelling    Did it involve swelling of the face/tongue/throat, SOB, or low BP? Yes Did it involve sudden or severe rash/hives, skin peeling, or any reaction on the inside of your mouth or nose? No Did you need to seek medical attention at a hospital or doctor's office? Yes When did it last happen?1996 If all above answers are "NO", may proceed with cephalosporin use.  . Povidone-Iodine Rash    Past Medical History:  Diagnosis Date  . Allergic rhinitis   . 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 Anemia   . Anxiety   . ASCVD (arteriosclerotic cardiovascular disease)    MI in 96 requiring BMS CX; DES to M1 in 2000;normal coronary angiography in 2004  . Cholelithiasis   . COPD (chronic obstructive pulmonary disease) ([place])   . DDD (degenerative disc disease), lumbar   . Depression   . Diabetes mellitus    Type II  . Diarrhea   . DVT (deep venous thrombosis) ([place])    patient said no  . GERD (gastroesophageal reflux disease)   . Headache    migraine  . History of kidney stones    2006  . Hyperlipidemia   . Hypotension   . Hypothyroidism   . Low back pain   . Myocardial infarction ([place]) 01/1995  . Nephrolithiasis 2006   stone extraction   . Panic attacks   . Peripheral neuropathy   . Peripheral vascular disease (HCC)    legs   . Pneumonia 12/22/2019  . Sciatic pain    right  . Tobacco abuse   . Tremor     Past Surgical History:  Procedure Laterality Date  . AGILE CAPSULE N/A 03/07/2020   Procedure: AGILE CAPSULE;  Surgeon: Daneil Dolin, MD;  [facility];  Service: Endoscopy;  Laterality: N/A;  7:30am  . BACK SURGERY     fusion  . BRONCHIAL NEEDLE ASPIRATION BIOPSY N/A 05/20/2020   Procedure: BRONCHIAL NEEDLE ASPIRATION BIOPSIES;  Surgeon: Collene Gobble, MD;  Location: [facility] ENDOSCOPY;  Service: Pulmonary;  Laterality: N/A;  . CHOLECYSTECTOMY    . COLONOSCOPY  01/2009   PQD:IYMEBR rectum/repeat in 5 yrs  . COLONOSCOPY N/A 04/29/2014   Dr.Rourk- attempted/incomplete colonoscopy. inadequate prep  . COLONOSCOPY N/A 05/27/2014   AXE:NMMHWKGSU coli. Colonic polyps-removed as described above.Status post segmental biopsy. single tubular adenoma and random colon bx neg  . COLONOSCOPY WITH PROPOFOL N/A 12/23/2019  Procedure: COLONOSCOPY WITH PROPOFOL;  Surgeon: Ronnette Juniper, MD;  Location: [facility];  Service: Gastroenterology;  Laterality: N/A;  . DILATION AND CURETTAGE OF UTERUS  1974  . ESOPHAGOGASTRODUODENOSCOPY  05/2010   Dr. Tessie Fass, erosion. 33F dilation  . ESOPHAGOGASTRODUODENOSCOPY N/A 04/29/2014   Dr.Rourk- normal esophagus s/p passage of maloney dilator. small hiatal hernia- bx= chronic inflammation.  . ESOPHAGOGASTRODUODENOSCOPY (EGD) WITH PROPOFOL N/A 12/23/2019   Procedure: ESOPHAGOGASTRODUODENOSCOPY (EGD) WITH PROPOFOL;  Surgeon: Ronnette Juniper, MD;  Location: [facility];  Service: Gastroenterology;  Laterality: N/A;  . GIVENS CAPSULE STUDY N/A 03/22/2020   Procedure: GIVENS CAPSULE STUDY;  Surgeon: Daneil Dolin, MD;  [facility];  Service: Endoscopy;  Laterality: N/A;  7:30am  . HEMOSTASIS CLIP PLACEMENT  12/23/2019   Procedure: HEMOSTASIS CLIP PLACEMENT;  Surgeon: Ronnette Juniper, MD;  Location: [facility];  Service: Gastroenterology;;  . HOT HEMOSTASIS N/A 12/23/2019    Procedure: HOT HEMOSTASIS (ARGON PLASMA COAGULATION/BICAP);  Surgeon: Ronnette Juniper, MD;  Location: [facility];  Service: Gastroenterology;  Laterality: N/A;  . LEFT HEART CATH AND CORONARY ANGIOGRAPHY N/A 12/24/2019   Procedure: LEFT HEART CATH AND CORONARY ANGIOGRAPHY;  Surgeon: Nigel Mormon, MD;  Location: [facility];  Service: Cardiovascular;  Laterality: N/A;  . Venia Minks DILATION N/A 04/29/2014   Procedure: Venia Minks DILATION;  Surgeon: Daneil Dolin, MD;  [facility];  Service: Endoscopy;  Laterality: N/A;  . PARTIAL HYSTERECTOMY  1978  . POLYPECTOMY  12/23/2019   Procedure: POLYPECTOMY;  Surgeon: Ronnette Juniper, MD;  Location[facility]COPY;  Service: Gastroenterology;;  . SHOULDER SURGERY Left    Left shoulder for RTC;left arm surgery '98/left hand surgery 2001  . TOTAL ABDOMINAL HYSTERECTOMY W/ BILATERAL SALPINGOOPHORECTOMY  2002  . UMBILICAL HERNIA REPAIR  2008  . VIDEO BRONCHOSCOPY WITH ENDOBRONCHIAL ULTRASOUND N/A 05/20/2020   Procedure: VIDEO BRONCHOSCOPY WITH ENDOBRONCHIAL ULTRASOUND;  Surgeon: Collene Gobble, MD;  Location: [facility] ENDOSCOPY;  Service: Pulmonary;  Laterality: N/A;    Family History  Problem Relation Age of Onset  . Depression Mother   . Bipolar disorder Mother   . Dementia Mother   . Pulmonary fibrosis Mother   . Alcohol abuse Father   . Aneurysm Father        deceased age 98, brain  . Colon cancer Paternal Grandfather        age greater than 19    Social History   Socioeconomic History  . Marital status: Divorced    Spouse name: Not on file  . Number of children: 2  . Years of education: Not on file  . Highest education level: Not on file  Occupational History  . 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 Smoking status: Former Smoker    Packs/day: 0.50    Years: 40.00    Pack years: 20.00    Types: Cigarettes    Quit date: 08/30/2017    Years since  quitting: 2.9  . Smokeless tobacco: Never Used  Vaping Use  . Vaping Use: Never used  Substance and Sexual Activity  . Alcohol use: No  . Drug use: No  . Sexual activity: Not Currently  Other Topics Concern  . Not on file  Social History Narrative   Married x3   Lives with husband   GED in 2005   Social Determinants of Health   Financial Resource Strain:   . Difficulty of Paying Living Expenses:  Not on file  Food Insecurity:   . Worried About Charity fundraiser in the Last Year: Not on file  . Ran Out of Food in the Last Year: Not on file  Transportation Needs:   . Lack of Transportation (Medical): Not on file  . Lack of Transportation (Non-Medical): Not on file  Physical Activity:   . Days of Exercise per Week: Not on file  . Minutes of Exercise per Session: Not on file  Stress:   . Feeling of Stress : Not on file  Social Connections:   . Frequency of Communication with Friends and Family: Not on file  . Frequency of Social Gatherings with Friends and Family: Not on file  . Attends Religious Services: Not on file  . Active Member of Clubs or Organizations: Not on file  . Attends Archivist Meetings: Not on file  . Marital Status: Not on file  Intimate Partner Violence:   . Fear of Current or Ex-Partner: Not on file  . Emotionally Abused: Not on file  . Physically Abused: Not on file  . Sexually Abused: Not on file    Past Medical History, Surgical history, Social history, and Family history were reviewed and updated as appropriate.   Please see review of systems for further details on the patient's review from today.   Review of Systems:  Review of Systems  Constitutional: Negative for chills, diaphoresis and fever.  HENT: Negative for trouble swallowing and voice change.   Respiratory: Negative for cough, chest tightness, shortness of breath and wheezing.   Cardiovascular: Negative for chest pain and palpitations.  Gastrointestinal: Negative for  abdominal pain, constipation, diarrhea, nausea and vomiting.  Musculoskeletal: Negative for back pain and myalgias.  Neurological: Positive for dizziness. Negative for light-headedness and headaches.    Objective:   Physical Exam:  BP (!) 159/137   Pulse 76   Temp (!) 97 F (36.1 C) (Tympanic)   Resp 18   Ht 5' 6"  (1.676 m)   Wt 144 lb 9.6 oz (65.6 kg)   SpO2 100%   BMI 23.34 kg/m  ECOG: 0  Physical Exam Constitutional:      General: She is not in acute distress.    Appearance: She is not diaphoretic.  HENT:     Head: Normocephalic and atraumatic.  Eyes:     General: No scleral icterus.       Right eye: No discharge.        Left eye: No discharge.     Conjunctiva/sclera: Conjunctivae normal.  Musculoskeletal:     Comments: The patient's right hand is missing.  Skin:    General: Skin is warm and dry.     Findings: No erythema or rash.  Neurological:     Mental Status: She is alert.     Coordination: Coordination normal.     Gait: Gait normal.  Psychiatric:        Mood and Affect: Mood normal.        Behavior: Behavior normal.        Thought Content: Thought content normal.        Judgment: Judgment normal.     Lab Review:     Component Value Date/Time   NA 135 08/16/2020 1040   NA 138 05/25/2020 0806   K 5.5 (H) 08/16/2020 1040   CL 104 08/16/2020 1040   CO2 25 08/16/2020 1040   GLUCOSE 100 (H) 08/16/2020 1040   BUN 23 08/16/2020 1040   BUN  26 05/25/2020 0806   CREATININE 1.35 (H) 08/16/2020 1040   CREATININE 0.85 05/03/2012 0942   CALCIUM 9.6 08/16/2020 1040   PROT 6.8 08/16/2020 1040   PROT 6.9 05/25/2020 0806   ALBUMIN 3.6 08/16/2020 1040   ALBUMIN 3.6 (L) 05/25/2020 0806   AST 15 08/16/2020 1040   ALT 13 08/16/2020 1040   ALKPHOS 70 08/16/2020 1040   BILITOT 0.2 (L) 08/16/2020 1040   GFRNONAA 43 (L) 08/16/2020 1040   GFRAA 58 (L) 07/05/2020 1137       Component Value Date/Time   WBC 5.4 08/16/2020 1040   WBC 8.1 05/20/2020 0710   RBC  2.20 (L) 08/16/2020 1040   HGB 7.5 (L) 08/16/2020 1040   HGB 10.5 (L) 05/18/2020 0830   HCT 23.4 (L) 08/16/2020 1040   HCT 32.7 (L) 05/18/2020 0830   PLT 218 08/16/2020 1040   PLT 280 05/18/2020 0830   MCV 106.4 (H) 08/16/2020 1040   MCV 92 05/18/2020 0830   MCH 34.1 (H) 08/16/2020 1040   MCHC 32.1 08/16/2020 1040   RDW 24.2 (H) 08/16/2020 1040   RDW 12.4 05/18/2020 0830   LYMPHSABS 1.0 08/16/2020 1040   LYMPHSABS 1.7 05/18/2020 0830   MONOABS 0.7 08/16/2020 1040   EOSABS 0.1 08/16/2020 1040   EOSABS 0.2 05/18/2020 0830   BASOSABS 0.0 08/16/2020 1040   BASOSABS 0.0 05/18/2020 0830   -------------------------------  Imaging from last 24 hours (if applicable):  Radiology interpretation: VAS [place] UPPER EXTREMITY VENOUS DUPLEX  Result Date: 08/03/2020 UPPER VENOUS STUDY  Indications: Pain Comparison Study: no prior Performing Technologist: Abram Sander RVS  Examination Guidelines: A complete evaluation includes B-mode imaging, spectral Doppler, color Doppler, and power Doppler as needed of all accessible portions of each vessel. Bilateral testing is considered an integral part of a complete examination. Limited examinations for reoccurring indications may be performed as noted.  Left Findings: +----------+------------+---------+-----------+----------+-----------------+ LEFT      CompressiblePhasicitySpontaneousProperties     Summary      +----------+------------+---------+-----------+----------+-----------------+ IJV           Full       Yes       Yes                                +----------+------------+---------+-----------+----------+-----------------+ Subclavian    Full       Yes       Yes                                +----------+------------+---------+-----------+----------+-----------------+ Axillary      Full       Yes       Yes                                +----------+------------+---------+-----------+----------+-----------------+ Brachial      Full        Yes       Yes                                +----------+------------+---------+-----------+----------+-----------------+ Radial        Full                                                    +----------+------------+---------+-----------+----------+-----------------+  Ulnar         Full                                                    +----------+------------+---------+-----------+----------+-----------------+ Cephalic      None                                  Age Indeterminate +----------+------------+---------+-----------+----------+-----------------+ Basilic       Full                                                    +----------+------------+---------+-----------+----------+-----------------+  Summary:  Left: No evidence of deep vein thrombosis in the upper extremity. Findings consistent with age indeterminate superficial vein thrombosis involving the left cephalic vein.  *See table(s) above for measurements and observations.  Diagnosing physician: Harold Barban MD Electronically signed by Harold Barban MD on 08/03/2020 at 7:39:24 PM.    Final         Dr. Fanny Bien. [name]

## 2020-08-17 NOTE — Patient Instructions (Addendum)
[facility] Discharge Instructions for Patients Receiving Chemotherapy  Today you received the following chemotherapy agents: Etoposide and Carboplatin  To help prevent nausea and vomiting after your treatment, we encourage you to take your nausea medication  as prescribed.    If you develop nausea and vomiting that is not controlled by your nausea medication, call the clinic.   BELOW ARE SYMPTOMS THAT SHOULD BE REPORTED IMMEDIATELY:  *FEVER GREATER THAN 100.5 F  *CHILLS WITH OR WITHOUT FEVER  NAUSEA AND VOMITING THAT IS NOT CONTROLLED WITH YOUR NAUSEA MEDICATION  *UNUSUAL SHORTNESS OF BREATH  *UNUSUAL BRUISING OR BLEEDING  TENDERNESS IN MOUTH AND THROAT WITH OR WITHOUT PRESENCE OF ULCERS  *URINARY PROBLEMS  *BOWEL PROBLEMS  UNUSUAL RASH Items with * indicate a potential emergency and should be followed up as soon as possible.  Feel free to call the clinic should you have any questions or concerns. The clinic phone number is [phone].  Please show the [place] at check-in to the Emergency Department and triage nurse.   Blood Transfusion, Adult, Care After This sheet gives you information about how to care for yourself after your procedure. Your doctor may also give you more specific instructions. If you have problems or questions, contact your doctor. What can I expect after the procedure? After the procedure, it is common to have:  Bruising and soreness at the IV site.  A fever or chills on the day of the procedure. This may be your body's response to the new blood cells received.  A headache. Follow these instructions at home: Insertion site care      Follow instructions from your doctor about how to take care of your insertion site. This is where an IV tube was put into your vein. Make sure you: ? Wash your hands with soap and water before and after you change your bandage (dressing). If you cannot use soap and water, use hand  sanitizer. ? Change your bandage as told by your doctor.  Check your insertion site every day for signs of infection. Check for: ? Redness, swelling, or pain. ? Bleeding from the site. ? Warmth. ? Pus or a bad smell. General instructions  Take over-the-counter and prescription medicines only as told by your doctor.  Rest as told by your doctor.  Go back to your normal activities as told by your doctor.  Keep all follow-up visits as told by your doctor. This is important. Contact a doctor if:  You have itching or red, swollen areas of skin (hives).  You feel worried or nervous (anxious).  You feel weak after doing your normal activities.  You have redness, swelling, warmth, or pain around the insertion site.  You have blood coming from the insertion site, and the blood does not stop with pressure.  You have pus or a bad smell coming from the insertion site. Get help right away if:  You have signs of a serious reaction. This may be coming from an allergy or the body's defense system (immune system). Signs include: ? Trouble breathing or shortness of breath. ? Swelling of the face or feeling warm (flushed). ? Fever or chills. ? Head, chest, or back pain. ? Dark pee (urine) or blood in the pee. ? Widespread rash. ? Fast heartbeat. ? Feeling dizzy or light-headed. You may receive your blood transfusion in an outpatient setting. If so, you will be told whom to contact to report any reactions. These symptoms may be an emergency. Do  not wait to see if the symptoms will go away. Get medical help right away. Call your local emergency services (911 in the [place] Do not drive yourself to the hospital. Summary  Bruising and soreness at the IV site are common.  Check your insertion site every day for signs of infection.  Rest as told by your doctor. Go back to your normal activities as told by your doctor.  Get help right away if you have signs of a serious reaction. This  information is not intended to replace advice given to you by your health care provider. Make sure you discuss any questions you have with your health care provider. Document Revised: 03/26/2019 Document Reviewed: 03/26/2019 Elsevier Patient Education  [place].

## 2020-08-18 ENCOUNTER — Inpatient Hospital Stay: Payer: Medicare HMO

## 2020-08-18 ENCOUNTER — Other Ambulatory Visit: Payer: Self-pay

## 2020-08-18 VITALS — BP 95/44 | HR 70 | Temp 98.2°F | Resp 20

## 2020-08-18 DIAGNOSIS — C342 Malignant neoplasm of middle lobe, bronchus or lung: Secondary | ICD-10-CM

## 2020-08-18 DIAGNOSIS — Z5111 Encounter for antineoplastic chemotherapy: Secondary | ICD-10-CM | POA: Diagnosis not present

## 2020-08-18 LAB — BPAM RBC
Blood Product Expiration Date: 202111242359
ISSUE DATE / TIME: 202111030836
Unit Type and Rh: 600

## 2020-08-18 LAB — TYPE AND SCREEN
ABO/RH(D): A NEG
Antibody Screen: NEGATIVE
Unit division: 0

## 2020-08-18 MED ORDER — SODIUM CHLORIDE 0.9 % IV SOLN
Freq: Once | INTRAVENOUS | Status: AC
  Filled 2020-08-18: qty 250

## 2020-08-18 MED ORDER — SODIUM CHLORIDE 0.9 % IV SOLN
75.0000 mg/m2 | Freq: Once | INTRAVENOUS | Status: AC
  Administered 2020-08-18: 130 mg via INTRAVENOUS
  Filled 2020-08-18: qty 6.5

## 2020-08-18 MED ORDER — SODIUM CHLORIDE 0.9 % IV SOLN
10.0000 mg | Freq: Once | INTRAVENOUS | Status: AC
  Administered 2020-08-18: 10 mg via INTRAVENOUS
  Filled 2020-08-18: qty 10

## 2020-08-18 NOTE — Progress Notes (Signed)
Per Dr. [name], ok for treatment today with low blood pressure. Pt. denies chest pain, dizziness, and no shortness of breath noted.

## 2020-08-18 NOTE — Patient Instructions (Signed)
[facility] Discharge Instructions for Patients Receiving Chemotherapy  Today you received the following chemotherapy agent: Etoposide  To help prevent nausea and vomiting after your treatment, we encourage you to take your nausea medication as directed by your MD.   If you develop nausea and vomiting that is not controlled by your nausea medication, call the clinic.   BELOW ARE SYMPTOMS THAT SHOULD BE REPORTED IMMEDIATELY:  *FEVER GREATER THAN 100.5 F  *CHILLS WITH OR WITHOUT FEVER  NAUSEA AND VOMITING THAT IS NOT CONTROLLED WITH YOUR NAUSEA MEDICATION  *UNUSUAL SHORTNESS OF BREATH  *UNUSUAL BRUISING OR BLEEDING  TENDERNESS IN MOUTH AND THROAT WITH OR WITHOUT PRESENCE OF ULCERS  *URINARY PROBLEMS  *BOWEL PROBLEMS  UNUSUAL RASH Items with * indicate a potential emergency and should be followed up as soon as possible.  Feel free to call the clinic should you have any questions or concerns. The clinic phone number is [phone].  Please show the [place] at check-in to the Emergency Department and triage nurse.

## 2020-08-19 ENCOUNTER — Other Ambulatory Visit: Payer: Self-pay

## 2020-08-19 ENCOUNTER — Inpatient Hospital Stay: Payer: Medicare HMO

## 2020-08-19 VITALS — BP 114/52 | HR 79 | Temp 98.0°F | Resp 18

## 2020-08-19 DIAGNOSIS — Z5111 Encounter for antineoplastic chemotherapy: Secondary | ICD-10-CM | POA: Diagnosis not present

## 2020-08-19 DIAGNOSIS — C342 Malignant neoplasm of middle lobe, bronchus or lung: Secondary | ICD-10-CM

## 2020-08-19 MED ORDER — SODIUM CHLORIDE 0.9 % IV SOLN
75.0000 mg/m2 | Freq: Once | INTRAVENOUS | Status: AC
  Administered 2020-08-19: 130 mg via INTRAVENOUS
  Filled 2020-08-19: qty 6.5

## 2020-08-19 MED ORDER — SODIUM CHLORIDE 0.9 % IV SOLN
10.0000 mg | Freq: Once | INTRAVENOUS | Status: AC
  Administered 2020-08-19: 10 mg via INTRAVENOUS
  Filled 2020-08-19: qty 10

## 2020-08-19 MED ORDER — SODIUM CHLORIDE 0.9 % IV SOLN
Freq: Once | INTRAVENOUS | Status: AC
  Filled 2020-08-19: qty 250

## 2020-08-19 NOTE — Patient Instructions (Signed)
[facility] Discharge Instructions for Patients Receiving Chemotherapy  Today you received the following chemotherapy agent: Etoposide  To help prevent nausea and vomiting after your treatment, we encourage you to take your nausea medication as directed by your MD.   If you develop nausea and vomiting that is not controlled by your nausea medication, call the clinic.   BELOW ARE SYMPTOMS THAT SHOULD BE REPORTED IMMEDIATELY:  *FEVER GREATER THAN 100.5 F  *CHILLS WITH OR WITHOUT FEVER  NAUSEA AND VOMITING THAT IS NOT CONTROLLED WITH YOUR NAUSEA MEDICATION  *UNUSUAL SHORTNESS OF BREATH  *UNUSUAL BRUISING OR BLEEDING  TENDERNESS IN MOUTH AND THROAT WITH OR WITHOUT PRESENCE OF ULCERS  *URINARY PROBLEMS  *BOWEL PROBLEMS  UNUSUAL RASH Items with * indicate a potential emergency and should be followed up as soon as possible.  Feel free to call the clinic should you have any questions or concerns. The clinic phone number is [phone].  Please show the [place] at check-in to the Emergency Department and triage nurse.

## 2020-08-22 ENCOUNTER — Other Ambulatory Visit: Payer: Self-pay

## 2020-08-22 ENCOUNTER — Inpatient Hospital Stay: Payer: Medicare HMO

## 2020-08-22 VITALS — BP 92/47 | HR 75 | Temp 98.9°F | Resp 18

## 2020-08-22 DIAGNOSIS — C342 Malignant neoplasm of middle lobe, bronchus or lung: Secondary | ICD-10-CM

## 2020-08-22 DIAGNOSIS — Z5111 Encounter for antineoplastic chemotherapy: Secondary | ICD-10-CM | POA: Diagnosis not present

## 2020-08-22 MED ORDER — PEGFILGRASTIM-JMDB 6 MG/0.6ML ~~LOC~~ SOSY
PREFILLED_SYRINGE | SUBCUTANEOUS | Status: AC
  Filled 2020-08-22: qty 0.6

## 2020-08-22 MED ORDER — PEGFILGRASTIM-JMDB 6 MG/0.6ML ~~LOC~~ SOSY
6.0000 mg | PREFILLED_SYRINGE | Freq: Once | SUBCUTANEOUS | Status: AC
  Administered 2020-08-22: 6 mg via SUBCUTANEOUS

## 2020-08-22 NOTE — Patient Instructions (Signed)

## 2020-08-23 ENCOUNTER — Other Ambulatory Visit: Payer: Self-pay | Admitting: Internal Medicine

## 2020-08-23 ENCOUNTER — Telehealth: Payer: Self-pay

## 2020-08-23 DIAGNOSIS — C342 Malignant neoplasm of middle lobe, bronchus or lung: Secondary | ICD-10-CM

## 2020-08-23 MED ORDER — PREDNISONE 50 MG PO TABS: ORAL_TABLET | ORAL | 0 refills | Status: DC

## 2020-08-23 NOTE — Telephone Encounter (Signed)
Please switch to losartan 50 mg instead. Send 30 pills X 5 refills  Thanks MJP

## 2020-08-23 NOTE — Telephone Encounter (Signed)
[name] called to inform us that pt is having cough and teresa thinks it is due to the Lisinopril and would like to know if you can change it to another bp medication. Please advice. Thanks

## 2020-08-24 ENCOUNTER — Other Ambulatory Visit: Payer: Self-pay

## 2020-08-24 ENCOUNTER — Inpatient Hospital Stay: Payer: Medicare HMO

## 2020-08-24 ENCOUNTER — Other Ambulatory Visit: Payer: Self-pay | Admitting: Medical

## 2020-08-24 DIAGNOSIS — I82612 Acute embolism and thrombosis of superficial veins of left upper extremity: Secondary | ICD-10-CM

## 2020-08-24 MED ORDER — LOSARTAN POTASSIUM 50 MG PO TABS: 50.0000 mg | ORAL_TABLET | Freq: Every day | ORAL | 5 refills | Status: DC

## 2020-08-24 NOTE — Telephone Encounter (Signed)
Called pt to inform her about her medication pt understood

## 2020-08-29 ENCOUNTER — Telehealth: Payer: Self-pay | Admitting: Emergency Medicine

## 2020-08-29 ENCOUNTER — Other Ambulatory Visit: Payer: Self-pay | Admitting: Medical

## 2020-08-29 ENCOUNTER — Ambulatory Visit: Payer: Medicare HMO | Admitting: Gastroenterology

## 2020-08-29 NOTE — Telephone Encounter (Signed)
Returning pt's VM reporting her prescription is not available at [facility] in [place] for [place].  Confirmed that it was sent to [facility] instead, pt aware to call back CC if she has any issues with it being filled there/picking it up.  Denies any further questions/concerns at this time.

## 2020-08-31 ENCOUNTER — Inpatient Hospital Stay: Payer: Medicare HMO

## 2020-09-01 ENCOUNTER — Telehealth: Payer: Self-pay

## 2020-09-01 NOTE — Telephone Encounter (Signed)
LM for pt reminding her to take her Prednisone in preparation for her CT scan tomorrow.

## 2020-09-02 ENCOUNTER — Ambulatory Visit (HOSPITAL_COMMUNITY)
Admission: RE | Admit: 2020-09-02 | Discharge: 2020-09-02 | Disposition: A | Discharge status: Home / Self Care | Payer: Medicare HMO | Source: Ambulatory Visit | Attending: Internal Medicine | Admitting: Internal Medicine

## 2020-09-02 ENCOUNTER — Encounter (HOSPITAL_COMMUNITY): Payer: Self-pay

## 2020-09-02 ENCOUNTER — Other Ambulatory Visit: Payer: Self-pay

## 2020-09-02 DIAGNOSIS — C3490 Malignant neoplasm of unspecified part of unspecified bronchus or lung: Secondary | ICD-10-CM

## 2020-09-02 IMAGING — DX DG CHEST 1V PORT
1 series · 1 of 1 positions shown · non-contrast
Comparison: Chest radiograph dated 10/02/2015.

CLINICAL DATA: 60-year-old female with shortness of breath.

EXAM:
PORTABLE CHEST 1 VIEW

[chest ap]
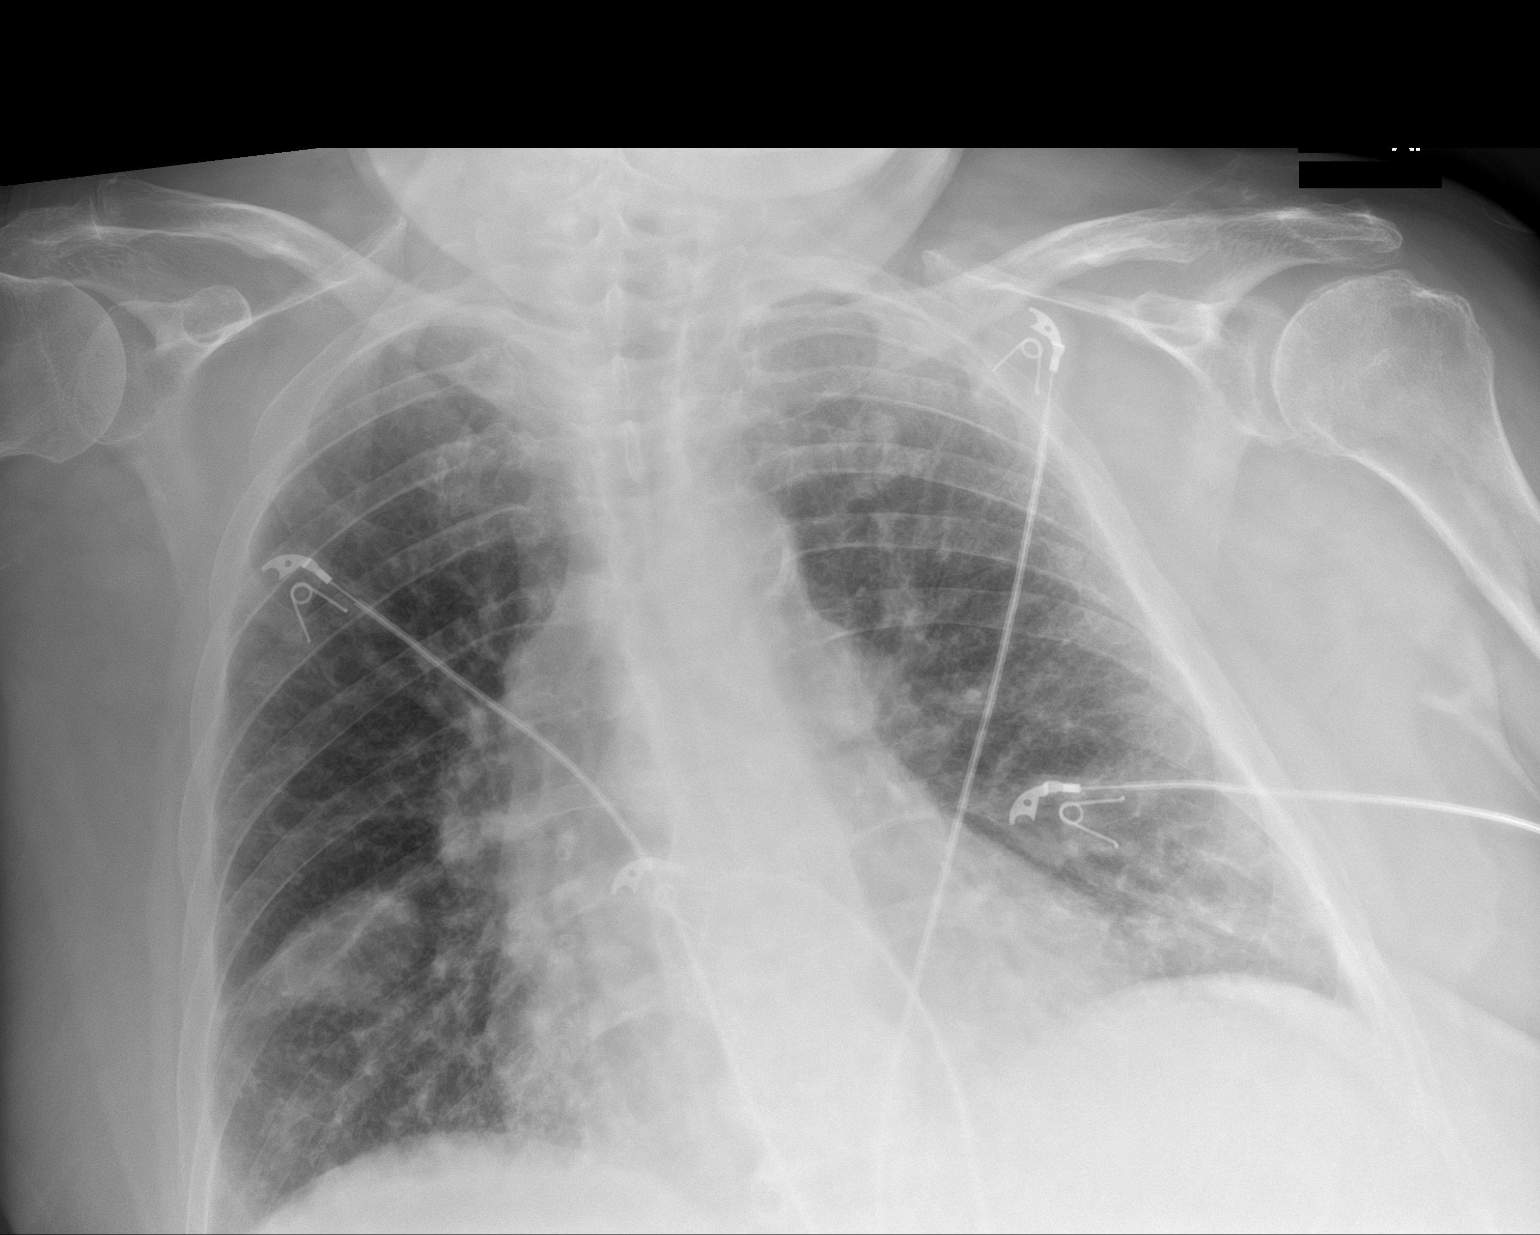

[1 of 1 positions shown; findings below may reference images not displayed]

FINDINGS: There is mild cardiomegaly and mild vascular congestion. Background
of chronic interstitial coarsening and mild bronchitic changes.
Faint diffuse bilateral peripheral and subpleural hazy densities may
represent chronic changes or mild edema. Atypical infiltrate is not
excluded. Clinical correlation is recommended. There is a 2.8 x
cm ovoid opacity in the right mid lung field which may represent a
round atelectasis, infiltrate, or loculated fluid within the
fissure. A mass is not excluded. Clinical correlation and close
follow-up after treatment and resolution of acute symptoms or
further evaluation with CT is recommended. There is no pleural
effusion or pneumothorax. Atherosclerotic calcification of the
aorta. No acute osseous pathology.
IMPRESSION: 1. Cardiomegaly with mild vascular congestion.
2. Ovoid opacity in the right mid lung field may represent round
atelectasis, infiltrate, or loculated fluid within the fissure. Lung
mass is not excluded. Clinical correlation and close follow-up after
treatment and resolution of acute symptoms or further evaluation
with CT is recommended.

## 2020-09-02 IMAGING — CT CT CHEST W/O CM
2 of 4 series · 15 of 36 positions shown, 18 images · non-contrast
Comparison: Chest radiograph dated 12/19/2019.

CLINICAL DATA: 68-year-old female with shortness of breath and
dizziness.

EXAM:
CT CHEST WITHOUT CONTRAST
TECHNIQUE: Multidetector CT imaging of the chest was performed following the
standard protocol without IV contrast.

[Series 2: routine chest without · axial · non-contrast · 0.87mm/px · z∈[+903,+1183]mm · 12 of 166 slices shown, 15 images]
[im 13/166  mediastinal]
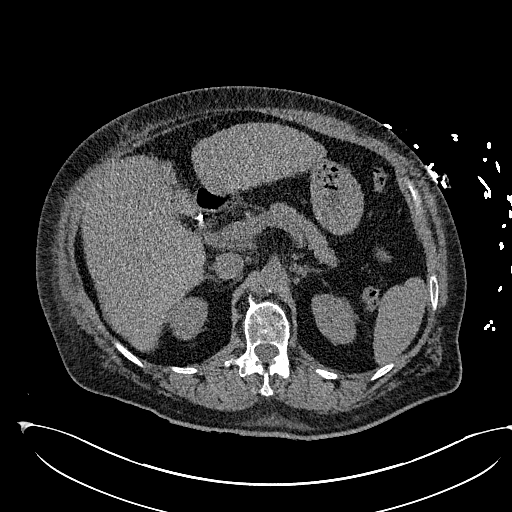
[im 13/166  lung]
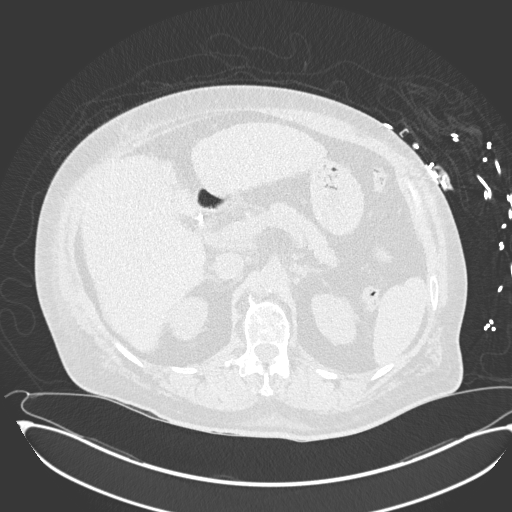
[im 26/166  lung]
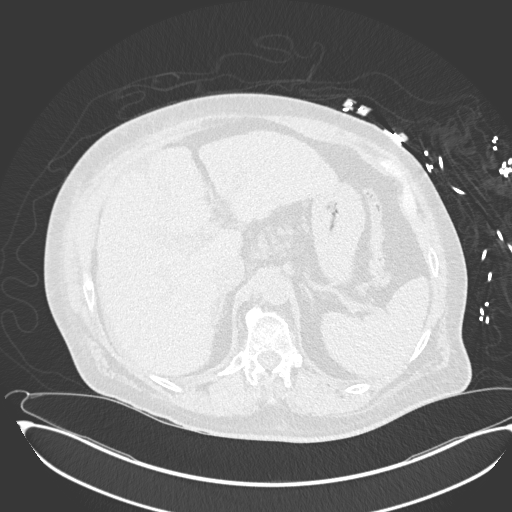
[im 39/166  lung]
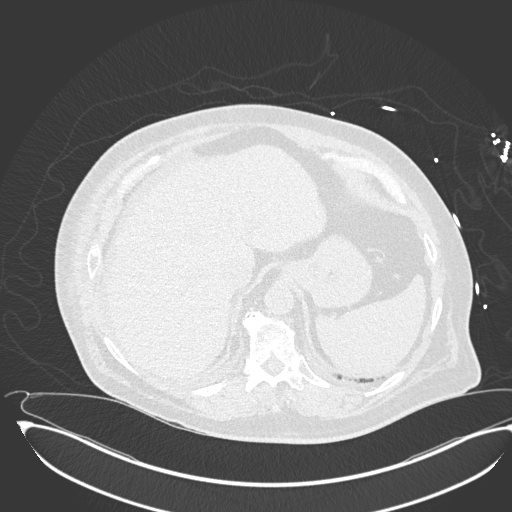
[im 51/166  lung]
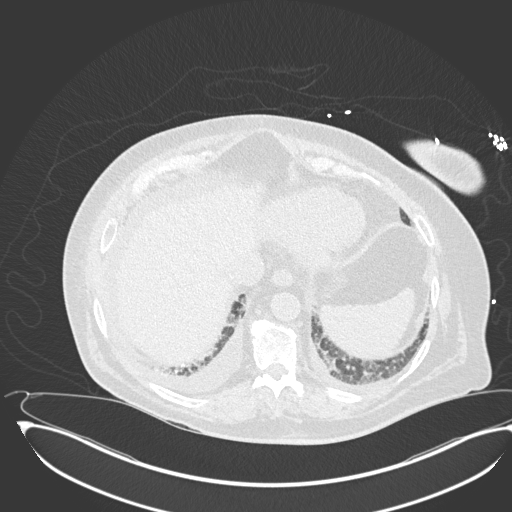
[im 64/166  mediastinal]
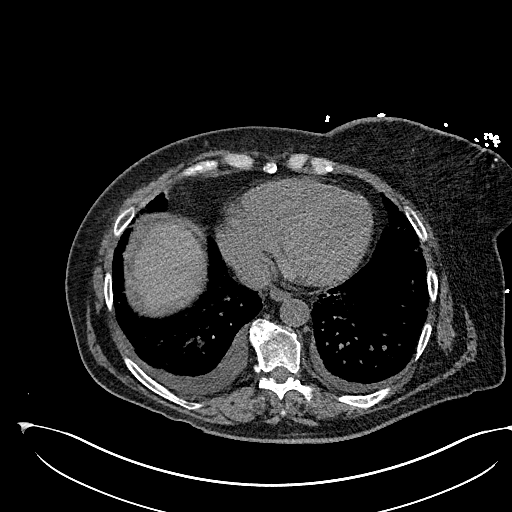
[im 64/166  lung]
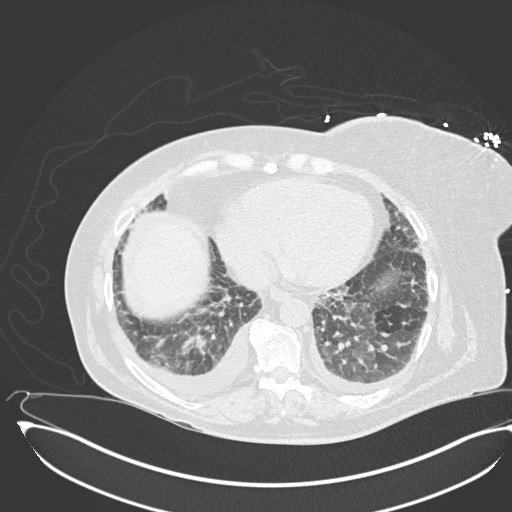
[im 77/166  lung]
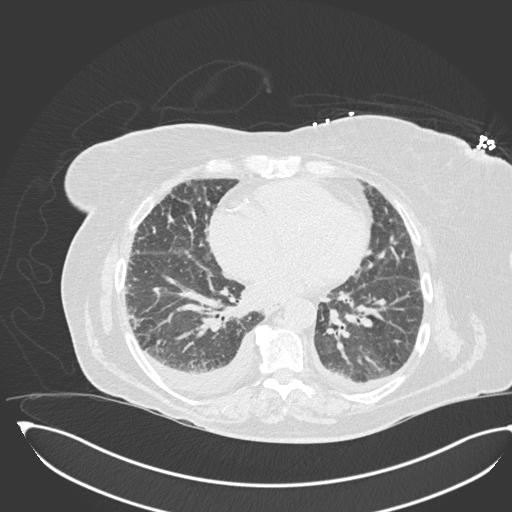
[im 89/166  lung]
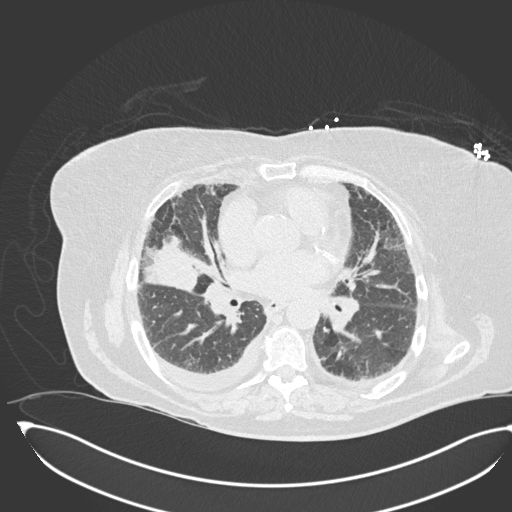
[im 102/166  lung]
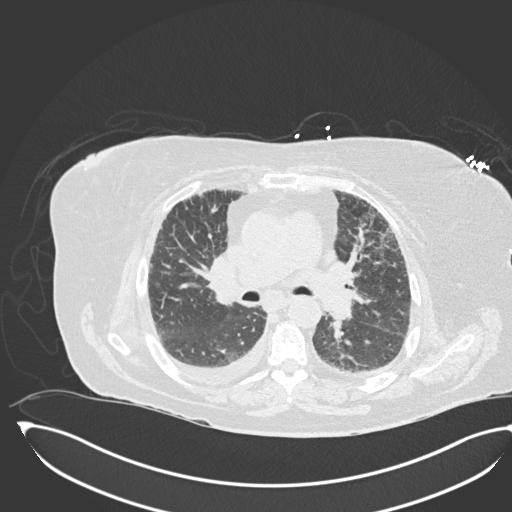
[im 115/166  mediastinal]
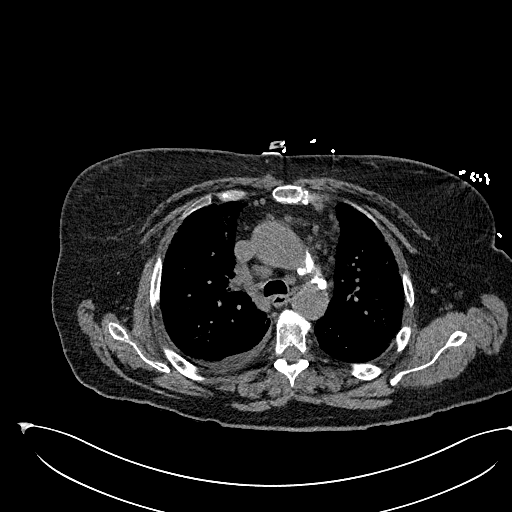
[im 115/166  lung]
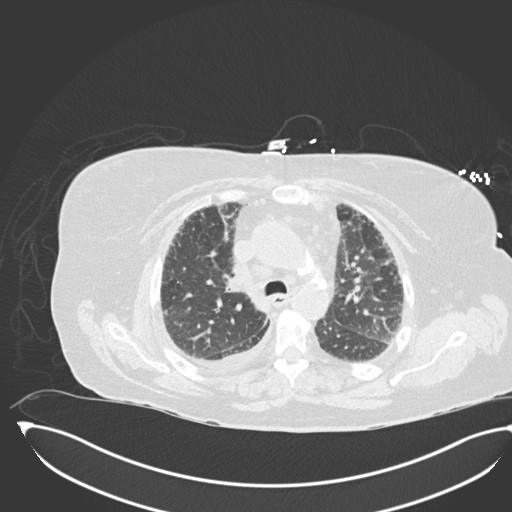
[im 127/166  lung]
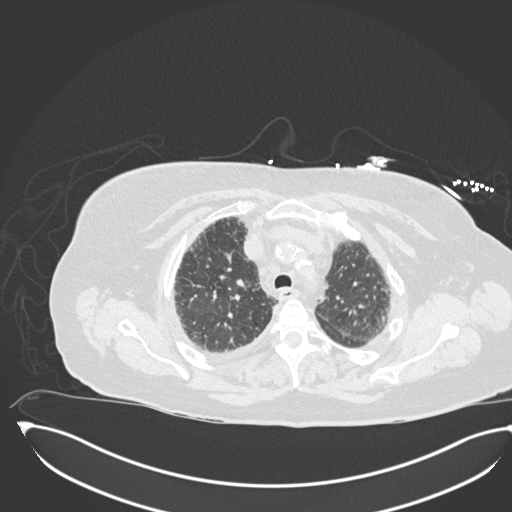
[im 140/166  lung]
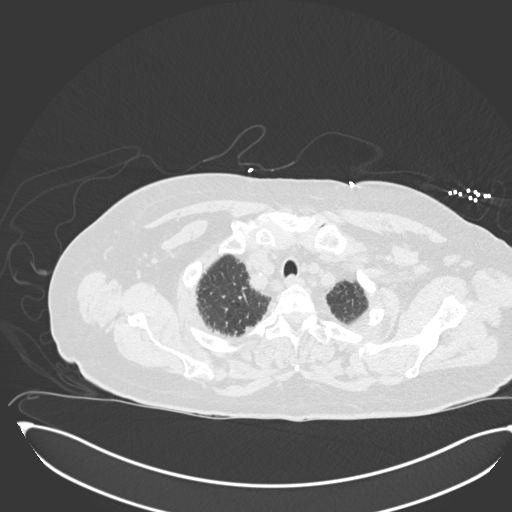
[im 153/166  lung]
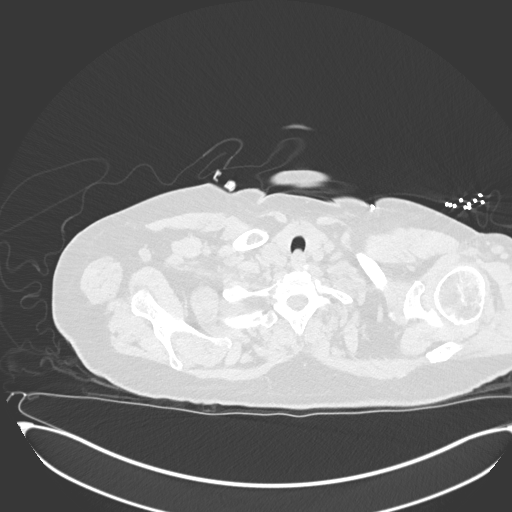

[Series 5: coronal · coronal · 0.73mm/px · 3 of 162 slices shown]
[im 33/162  lung]
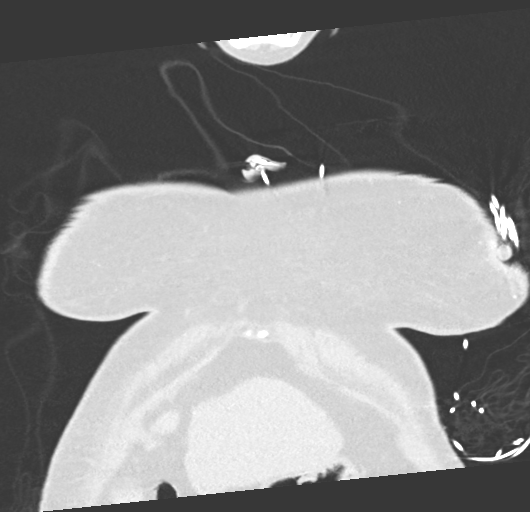
[im 65/162  lung]
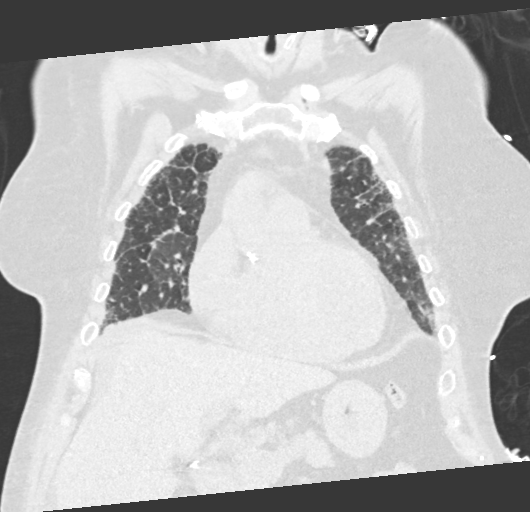
[im 97/162  lung]
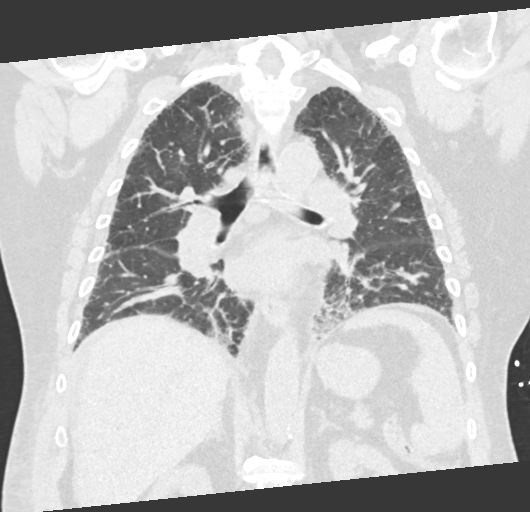

[15 of 36 positions shown; findings below may reference images not displayed]

FINDINGS: Evaluation of this exam is limited in the absence of intravenous
contrast.

Cardiovascular: There is no cardiomegaly or pericardial effusion.
There is 3 vessel coronary vascular calcification. There is
hypoattenuation of the cardiac blood pool suggestive of a degree of
anemia. Clinical correlation is recommended. Moderate
atherosclerotic calcification of the thoracic aorta. The central
pulmonary arteries are grossly unremarkable.

Mediastinum/Nodes: Mildly enlarged bilateral hilar and mediastinal
lymph nodes measure approximately 14 mm in short axis in the
prevascular space and 18 mm in short axis in the subcarinal region.
Evaluation of the hilar lymph nodes is limited in the absence of
intravenous contrast. There is however fullness of the hilar region
likely combination of vascular confluence and enlarged lymph nodes.
There is an apparent right hilar adenopathy measuring 14 mm in short
axis. The esophagus and the thyroid gland are grossly unremarkable.
No mediastinal fluid collection.

Lungs/Pleura: There are small bilateral pleural effusions, right
greater than left. There is diffuse interstitial and interlobular
septal prominence consistent with mild edema. There is background of
chronic interstitial coarsening. There is a 4.6 x 4.0 cm focal area
of consolidation in the right middle lobe corresponding to the
opacity seen on the earlier radiograph. This likely represent
pneumonia. However, a mass or malignancy is not excluded. Clinical
correlation and close follow-up after treatment and resolution of
acute symptoms recommended. There is no pneumothorax. The central
airways are patent.

Upper Abdomen: Cirrhosis. Cholecystectomy. Small ascites.

Musculoskeletal: Degenerative changes of the spine. No acute osseous
pathology.
IMPRESSION: 1. Focal consolidation in the right middle lobe most consistent with
pneumonia. A mass or malignancy is not excluded. Clinical
correlation and close follow-up after treatment and resolution of
acute symptoms recommended.
2. Small bilateral pleural effusions and findings of interstitial
edema.
3. Mildly enlarged bilateral hilar and mediastinal lymph nodes,
likely reactive.
4. Cirrhosis with small ascites.
5. Aortic Atherosclerosis ([id number]).

## 2020-09-02 MED ORDER — IOHEXOL 300 MG/ML  SOLN
75.0000 mL | Freq: Once | INTRAMUSCULAR | Status: AC | PRN
  Administered 2020-09-02: 75 mL via INTRAVENOUS

## 2020-09-04 IMAGING — DX DG CHEST 1V PORT
1 series · 1 of 1 positions shown · non-contrast
Comparison: December 19, 2019.

CLINICAL DATA: Shortness of breath.

EXAM:
PORTABLE CHEST 1 VIEW

[chest ap]
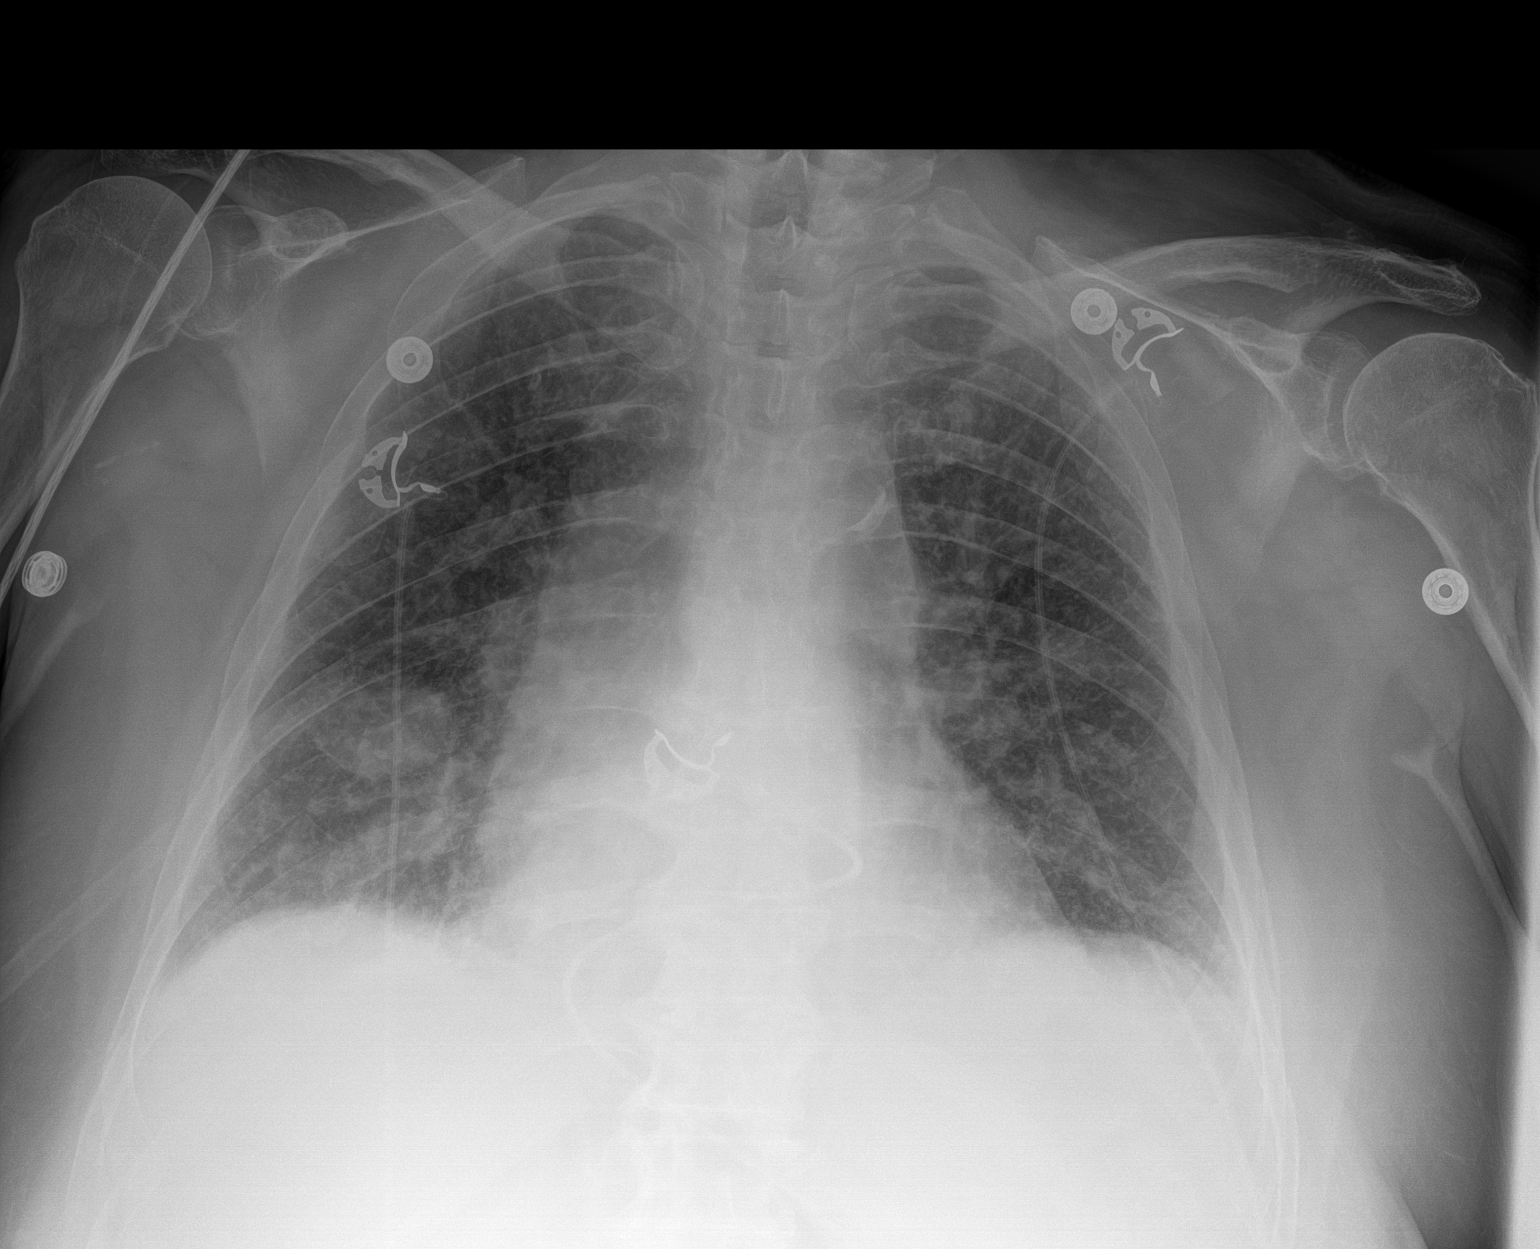

[1 of 1 positions shown; findings below may reference images not displayed]

FINDINGS: Stable cardiomediastinal silhouette. Atherosclerosis of thoracic
aorta is noted. No pneumothorax or pleural effusion is noted. Mild
bibasilar subsegmental atelectasis is noted. Stable right middle
lobe density is noted concerning for pneumonia, atelectasis or mass.
Bony thorax is unremarkable.
IMPRESSION: Stable right middle lobe density is noted concerning for pneumonia,
atelectasis or mass. Continued radiographic follow-up is
recommended. Mild bibasilar subsegmental atelectasis is noted.

Aortic Atherosclerosis ([id number]).

## 2020-09-04 IMAGING — US US ABDOMEN LIMITED
1 series · 14 of 25 positions shown · non-contrast
Comparison: CT chest 02/18/2020

CLINICAL DATA: Cirrhosis, search for ascites

EXAM:
ULTRASOUND ABDOMEN LIMITED RIGHT UPPER QUADRANT

[Series 1: us abdomen limited · 14 of 44 slices shown]
[im 1/44]
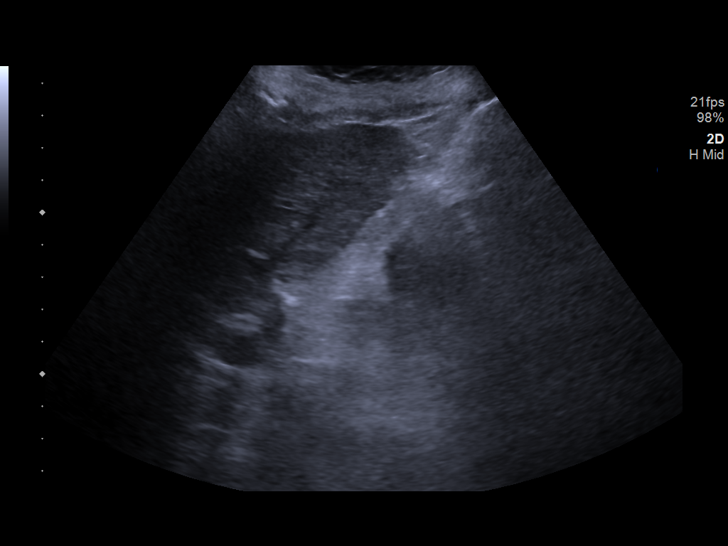
[im 4/44]
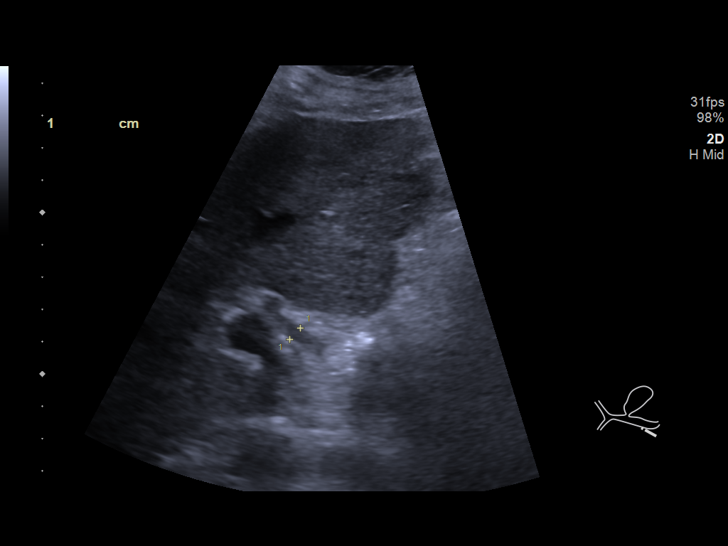
[im 8/44]
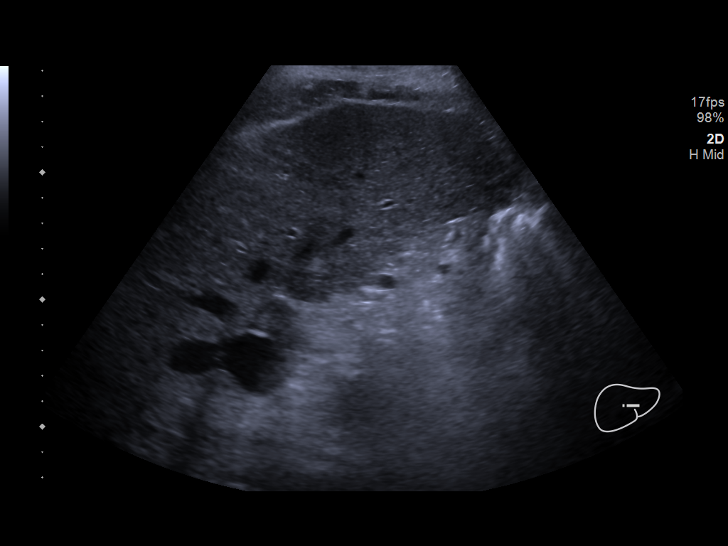
[im 11/44]
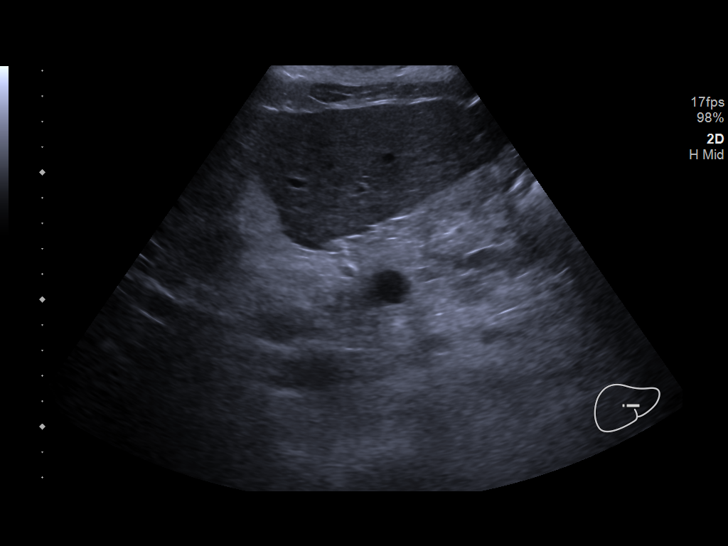
[im 15/44]
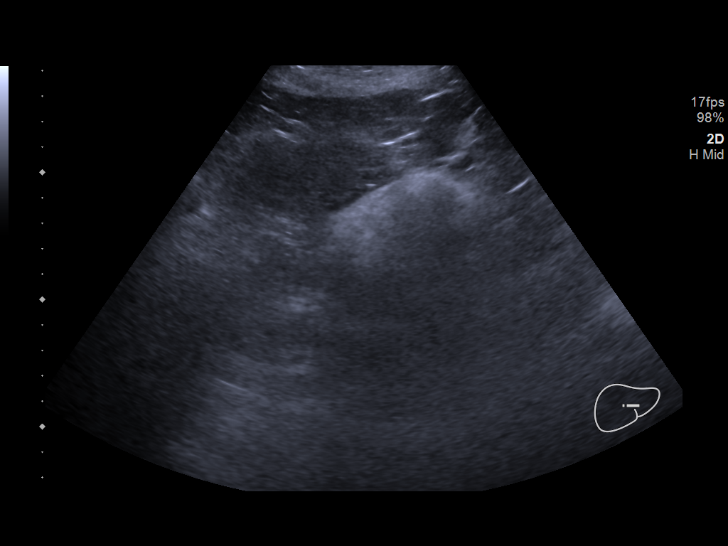
[im 17/44]
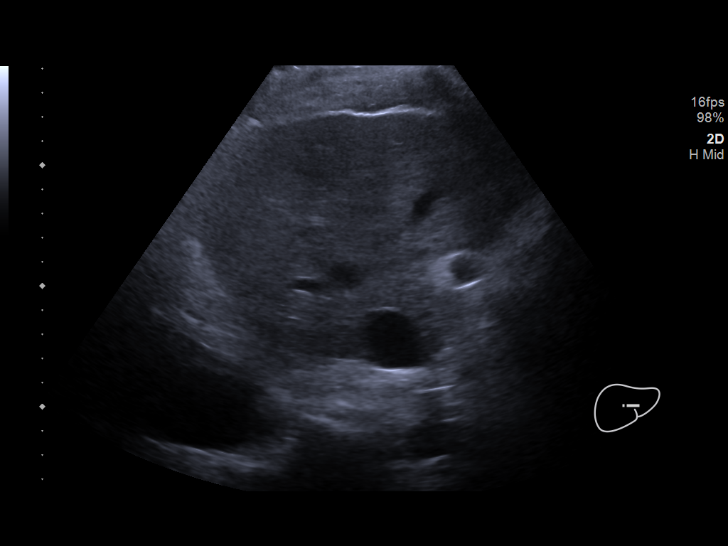
[im 20/44]
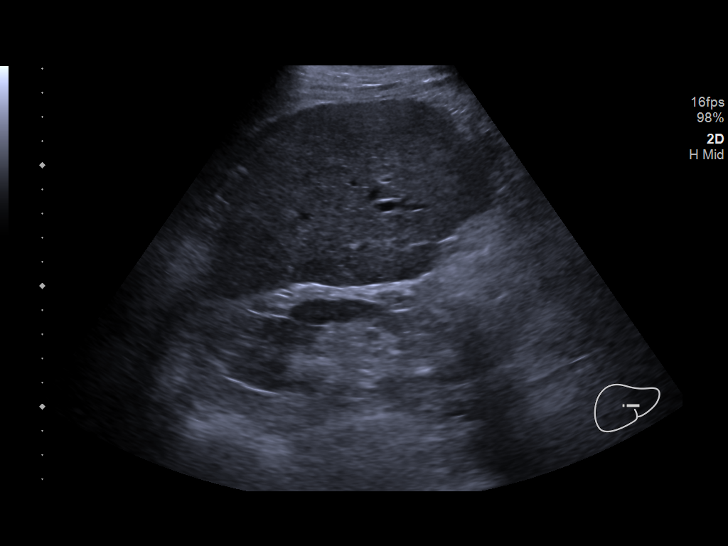
[im 24/44]
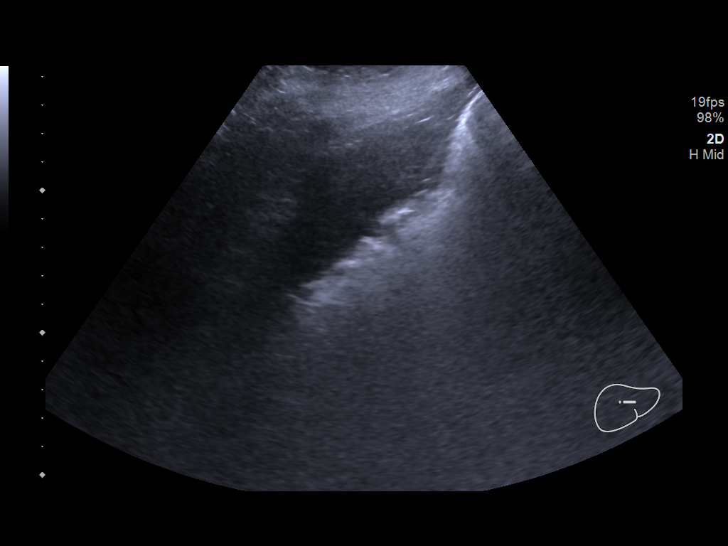
[im 27/44]
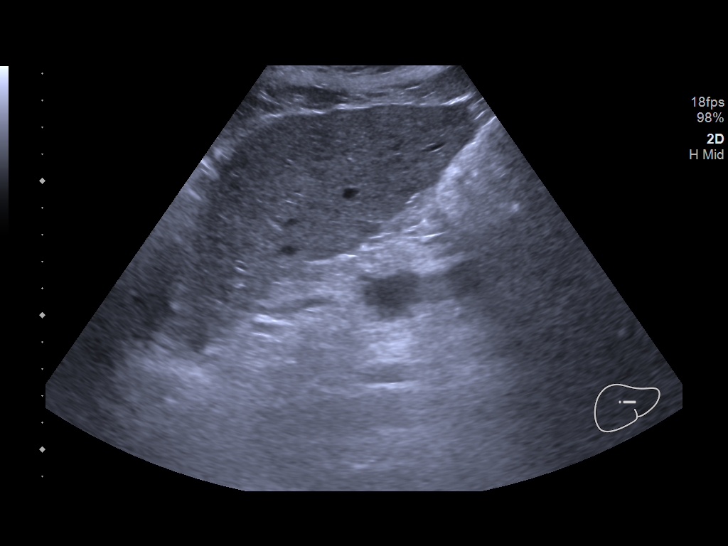
[im 29/44]
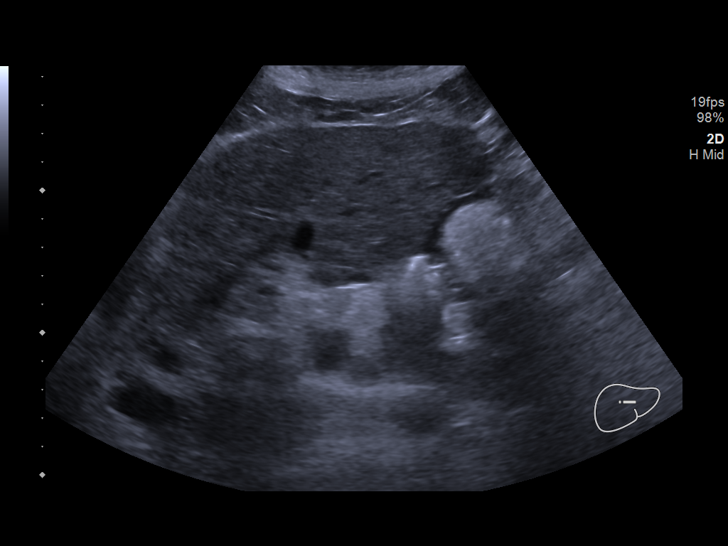
[im 33/44]
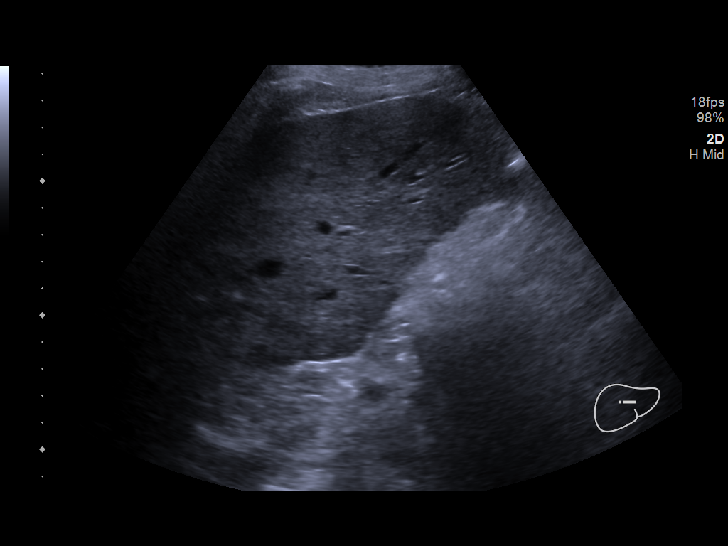
[im 36/44]
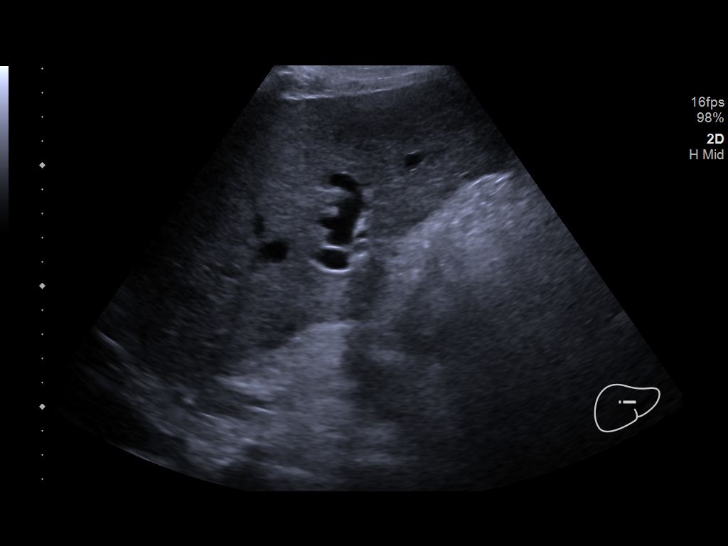
[im 40/44]
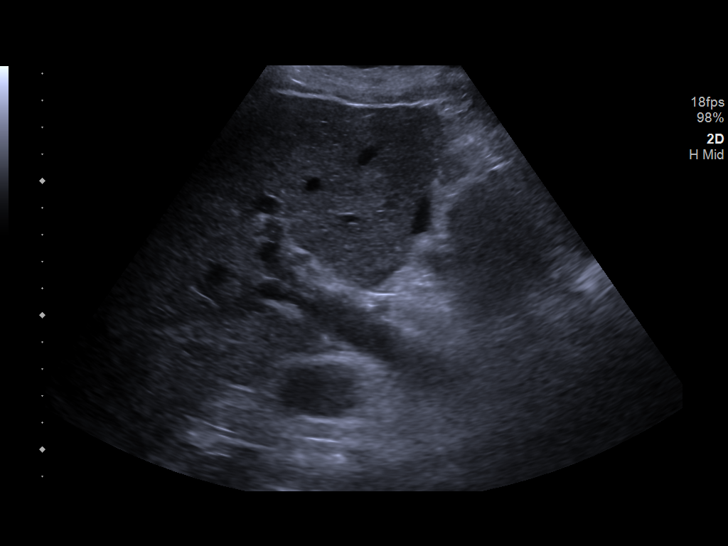
[im 44/44]
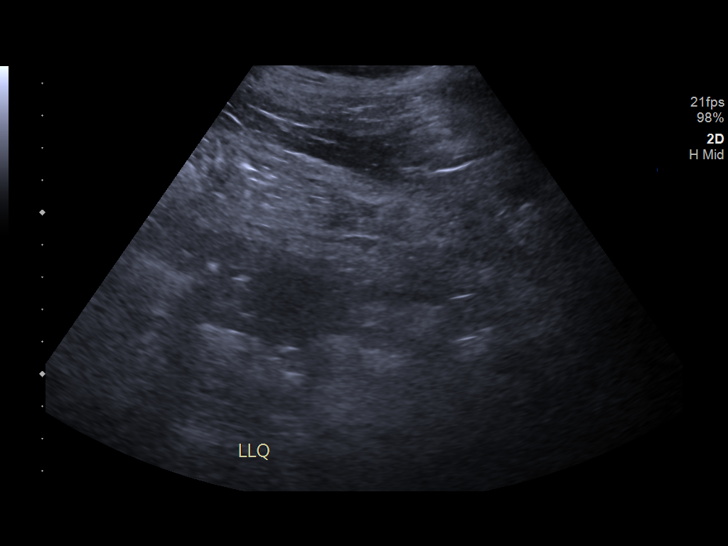

[14 of 25 positions shown; findings below may reference images not displayed]

FINDINGS: Gallbladder:

Surgically absent

Common bile duct:

Diameter: 0.5 cm

Liver:

No focal lesion identified. Nodular contour with diffuse coarse
echotexture. Portal vein is patent on color Doppler imaging with
normal direction of blood flow towards the liver. There is trace
perihepatic ascites.

Other: Small right pleural effusion.
IMPRESSION: 1.  Trace perihepatic ascites.

2.  Cirrhotic liver.

3.  Small right pleural effusion.

## 2020-09-05 ENCOUNTER — Other Ambulatory Visit: Payer: Self-pay

## 2020-09-05 ENCOUNTER — Inpatient Hospital Stay (HOSPITAL_BASED_OUTPATIENT_CLINIC_OR_DEPARTMENT_OTHER): Payer: Medicare HMO | Admitting: Internal Medicine

## 2020-09-05 ENCOUNTER — Other Ambulatory Visit: Payer: Medicare HMO

## 2020-09-05 ENCOUNTER — Other Ambulatory Visit: Payer: Self-pay | Admitting: Medical Oncology

## 2020-09-05 ENCOUNTER — Ambulatory Visit: Payer: Medicare HMO | Admitting: Internal Medicine

## 2020-09-05 ENCOUNTER — Inpatient Hospital Stay: Payer: Medicare HMO

## 2020-09-05 ENCOUNTER — Ambulatory Visit: Payer: Medicare HMO

## 2020-09-05 ENCOUNTER — Encounter: Payer: Self-pay | Admitting: Internal Medicine

## 2020-09-05 VITALS — BP 120/50 | HR 100 | Temp 96.4°F | Resp 18 | Ht 66.0 in | Wt 140.7 lb

## 2020-09-05 DIAGNOSIS — C342 Malignant neoplasm of middle lobe, bronchus or lung: Secondary | ICD-10-CM

## 2020-09-05 DIAGNOSIS — Z5111 Encounter for antineoplastic chemotherapy: Secondary | ICD-10-CM | POA: Diagnosis not present

## 2020-09-05 DIAGNOSIS — D649 Anemia, unspecified: Secondary | ICD-10-CM

## 2020-09-05 DIAGNOSIS — Z7189 Other specified counseling: Secondary | ICD-10-CM

## 2020-09-05 DIAGNOSIS — C3490 Malignant neoplasm of unspecified part of unspecified bronchus or lung: Secondary | ICD-10-CM | POA: Diagnosis not present

## 2020-09-05 LAB — CBC WITH DIFFERENTIAL (CANCER CENTER ONLY)
Abs Immature Granulocytes: 0.24 10*3/uL — ABNORMAL HIGH (ref 0.00–0.07)
Basophils Absolute: 0 10*3/uL (ref 0.0–0.1)
Basophils Relative: 0 %
Eosinophils Absolute: 0 10*3/uL (ref 0.0–0.5)
Eosinophils Relative: 0 %
HCT: 24.3 % — ABNORMAL LOW (ref 36.0–46.0)
Hemoglobin: 8 g/dL — ABNORMAL LOW (ref 12.0–15.0)
Immature Granulocytes: 2 %
Lymphocytes Relative: 12 %
Lymphs Abs: 1.2 10*3/uL (ref 0.7–4.0)
MCH: 36 pg — ABNORMAL HIGH (ref 26.0–34.0)
MCHC: 32.9 g/dL (ref 30.0–36.0)
MCV: 109.5 fL — ABNORMAL HIGH (ref 80.0–100.0)
Monocytes Absolute: 0.6 10*3/uL (ref 0.1–1.0)
Monocytes Relative: 6 %
Neutro Abs: 8.1 10*3/uL — ABNORMAL HIGH (ref 1.7–7.7)
Neutrophils Relative %: 80 %
Platelet Count: 82 10*3/uL — ABNORMAL LOW (ref 150–400)
RBC: 2.22 MIL/uL — ABNORMAL LOW (ref 3.87–5.11)
WBC Count: 10.1 10*3/uL (ref 4.0–10.5)
nRBC: 0.2 % (ref 0.0–0.2)

## 2020-09-05 LAB — CMP (CANCER CENTER ONLY)
ALT: 25 U/L (ref 0–44)
AST: 21 U/L (ref 15–41)
Albumin: 3.9 g/dL (ref 3.5–5.0)
Alkaline Phosphatase: 87 U/L (ref 38–126)
Anion gap: 8 (ref 5–15)
BUN: 15 mg/dL (ref 8–23)
CO2: 29 mmol/L (ref 22–32)
Calcium: 9.6 mg/dL (ref 8.9–10.3)
Chloride: 102 mmol/L (ref 98–111)
Creatinine: 1.18 mg/dL — ABNORMAL HIGH (ref 0.44–1.00)
GFR, Estimated: 50 mL/min — ABNORMAL LOW (ref >60)
Glucose, Bld: 110 mg/dL — ABNORMAL HIGH (ref 70–99)
Potassium: 4.4 mmol/L (ref 3.5–5.1)
Sodium: 139 mmol/L (ref 135–145)
Total Bilirubin: 0.3 mg/dL (ref 0.3–1.2)
Total Protein: 7.2 g/dL (ref 6.5–8.1)

## 2020-09-05 LAB — SAMPLE TO BLOOD BANK

## 2020-09-05 NOTE — Progress Notes (Signed)
[facility] [phone]   [phone]  OFFICE PROGRESS NOTE  [name], MD [address] [place] [address]  DIAGNOSIS: Extensive stage (T3, N2, M1c) small cell lung cancer presented with right middle lobe lung mass in addition to right hilar and low right paratracheal adenopathy in addition to tiny solitary brain metastasis diagnosed in August 2021.  PRIOR THERAPY: Palliative systemic chemotherapy with carboplatin for AUC of 5 on day 1, etoposide 100 mg/M2 on days 1, 2 and 3 with Neulasta support.    Status post 4 cycles.  This is concurrent with radiotherapy which was completed.  CURRENT THERAPY: Observation.  INTERVAL HISTORY: [name] 69 y.o. female returns to the clinic today for follow-up visit.  The patient is feeling fine today with no concerning complaints.  She denied having any current chest pain, shortness of breath, cough or hemoptysis.  She denied having any fever or chills.  She has no nausea, vomiting, diarrhea or constipation.  She has no headache or visual changes.  The patient denied having any recent weight loss or night sweats.  She tolerated the previous course of systemic chemotherapy as well as radiation fairly well.  She had repeat CT scan of the chest performed recently and she is here for evaluation and discussion of her risk her results.  MEDICAL HISTORY: Past Medical History:  Diagnosis Date  . Allergic rhinitis   . 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 Anemia   . Anxiety   . ASCVD (arteriosclerotic cardiovascular disease)    MI in 96 requiring BMS CX; DES to M1 in 2000;normal coronary angiography in 2004  . Cholelithiasis   . COPD (chronic obstructive pulmonary disease) ([facility])   . DDD (degenerative disc disease), lumbar   . Depression   . Diabetes mellitus    Type II  . Diarrhea   . DVT (deep venous thrombosis) ([facility])    patient said no  . GERD (gastroesophageal reflux disease)   .  Headache    migraine  . History of kidney stones    2006  . Hyperlipidemia   . Hypotension   . Hypothyroidism   . Low back pain   . Myocardial infarction ([facility]) 01/1995  . Nephrolithiasis 2006   stone extraction   . Panic attacks   . Peripheral neuropathy   . Peripheral vascular disease ([facility])    legs  . Pneumonia 12/22/2019  . Sciatic pain    right  . Tobacco abuse   . Tremor     ALLERGIES:  is allergic to iohexol, tape, ciprofloxacin, latex, penicillins, and povidone-iodine.  MEDICATIONS:  Current Outpatient Medications  Medication Sig Dispense Refill  . albuterol (PROVENTIL HFA;VENTOLIN HFA) 108 (90 BASE) MCG/ACT inhaler Inhale 2 puffs into the lungs every 4 (four) hours as needed for wheezing or shortness of breath. 1 Inhaler 0  . aspirin-acetaminophen-caffeine (EXCEDRIN MIGRAINE) 250-250-65 MG tablet Take 2 tablets by mouth daily as needed for headache. Up to 4 times a week    . azelastine (OPTIVAR) 0.05 % ophthalmic solution Place 1 drop into both eyes 2 (two) times daily.    Marland Kitchen b complex vitamins tablet Take 1 tablet by mouth daily. With Zinc and vitamin C    . Benzocaine (BOIL-EASE EX) Apply 1 application topically daily as needed (Boil).    . Biotin 5000 MCG TABS Take 10,000 mcg by mouth daily. Keratin    . carbamide peroxide (DEBROX)  6.5 % OTIC solution 5 drops daily as needed.    . Cholecalciferol (VITAMIN D3) 50 MCG (2000 UT) TABS Take 4,000 Units by mouth daily.    . colestipol (COLESTID) 5 g packet Take 3 g by mouth daily. Do not take within 2 hours of other medications. 18 packet 0  . Cyanocobalamin 2500 MCG CHEW Chew 2,500 mcg by mouth daily.    . diphenhydrAMINE (BENADRYL) 50 MG capsule Take 64m 1 hour before scheduled CT 1 capsule 0  . ferrous sulfate 325 (65 FE) MG tablet Take 325 mg by mouth daily with breakfast.    . Fluticasone-Umeclidin-Vilant (TRELEGY ELLIPTA) 100-62.5-25 MCG/INH AEPB Inhale 1 puff into the lungs daily. 28 each 5  . furosemide (LASIX) 40  MG tablet Take 1 tablet (40 mg total) by mouth 2 (two) times daily. (Patient taking differently: Take 40 mg by mouth daily. ) 30 tablet 1  . Ginkgo Biloba Extract (GNP GINGKO BILOBA EXTRACT) 60 MG CAPS Take 2 capsules by mouth daily.     . hydrocortisone cream 1 % Apply 1 application topically daily as needed for itching.    .Marland KitchenHydrocortisone, Perianal, (PROCTO-PAK) 1 % CREA Apply anorectally twice daily for two weeks. 28 g 0  . ibuprofen (ADVIL) 200 MG tablet Take 200-600 mg by mouth every 6 (six) hours as needed for moderate pain.    .Marland KitchenLANTUS SOLOSTAR 100 UNIT/ML Solostar Pen Inject 16 Units into the skin daily.     .Marland Kitchenlevothyroxine (SYNTHROID) 100 MCG tablet Take 112 mcg by mouth daily before breakfast.     . LORazepam (ATIVAN) 0.5 MG tablet Take 1 tablet 30 minutes before the MRI.  Repeat once if needed. 2 tablet 0  . losartan (COZAAR) 50 MG tablet Take 1 tablet (50 mg total) by mouth daily. 30 tablet 5  . Menthol, Topical Analgesic, (ICY HOT) 7.5 % (Roll) MISC Apply 1 each topically daily as needed (Pain). spray     . metFORMIN (GLUCOPHAGE) 500 MG tablet Take 500 mg by mouth 2 (two) times daily with a meal.     . mirtazapine (REMERON) 15 MG tablet Take 7.5 mg by mouth at bedtime.    . Multiple Vitamins-Minerals (MULTIVITAMIN WITH MINERALS) tablet Take 1 tablet by mouth daily. Adult 50+    . neomycin-bacitracin-polymyxin (NEOSPORIN) OINT Apply 1 application topically daily as needed for irritation or wound care.    . Nitroglycerin 0.4 % OINT Place one inch anorectally with a gloved finger every 12 hours for up to 3 weeks for anal fissure. Apply with a gloved finger to prevent absorption of nitroglycerin through the skin which can lead to headache. 30 g 0  . Omega 3-6-9 Fatty Acids (OMEGA 3-6-9 COMPLEX PO) Take 2 tablets by mouth daily.    .Marland KitchenoxyCODONE-acetaminophen (PERCOCET/ROXICET) 5-325 MG tablet Take 0.5 tablets by mouth 2 (two) times daily as needed for severe pain.    . pantoprazole  (PROTONIX) 40 MG tablet Take 40 mg by mouth daily.    .Marland KitchenPARoxetine (PAXIL) 20 MG tablet Take 20 mg by mouth daily.     . pravastatin (PRAVACHOL) 40 MG tablet Take 40 mg by mouth daily.    . predniSONE (DELTASONE) 50 MG tablet Take 50 mg 13 hours, 7 hours, and 2 hours before scheduled CT scan 3 tablet 0  . prochlorperazine (COMPAZINE) 10 MG tablet Take 1 tablet (10 mg total) by mouth every 6 (six) hours as needed. 30 tablet 2  . pyridOXINE (VITAMIN B-6) 50 MG tablet  Take 200 mg by mouth daily.    . rivaroxaban (XARELTO) 20 MG TABS tablet Take 1 tablet (20 mg total) by mouth daily with supper. 30 tablet 4  . rosuvastatin (CRESTOR) 10 MG tablet Take 1 tablet (10 mg total) by mouth daily. 90 tablet 3  . sucralfate (CARAFATE) 1 g tablet Take 1 tablet (1 g total) by mouth 4 (four) times daily -  with meals and at bedtime. Crush and dissolve in 15 mL of warm water prior to swallowing 120 tablet 1  . traZODone (DESYREL) 150 MG tablet Take 150 mg by mouth at bedtime.      No current facility-administered medications for this visit.    SURGICAL HISTORY:  Past Surgical History:  Procedure Laterality Date  . AGILE CAPSULE N/A 03/07/2020   Procedure: AGILE CAPSULE;  Surgeon: Daneil Dolin, MD;  [facility];  Service: Endoscopy;  Laterality: N/A;  7:30am  . BACK SURGERY     fusion  . BRONCHIAL NEEDLE ASPIRATION BIOPSY N/A 05/20/2020   Procedure: BRONCHIAL NEEDLE ASPIRATION BIOPSIES;  Surgeon: Collene Gobble, MD;  Location: [facility] ENDOSCOPY;  Service: Pulmonary;  Laterality: N/A;  . CHOLECYSTECTOMY    . COLONOSCOPY  01/2009   MLJ:QGBEEF rectum/repeat in 5 yrs  . COLONOSCOPY N/A 04/29/2014   Dr.Rourk- attempted/incomplete colonoscopy. inadequate prep  . COLONOSCOPY N/A 05/27/2014   EOF:HQRFXJOIT coli. Colonic polyps-removed as described above.Status post segmental biopsy. single tubular adenoma and random colon bx neg  . COLONOSCOPY WITH PROPOFOL N/A 12/23/2019   Procedure: COLONOSCOPY WITH  PROPOFOL;  Surgeon: Ronnette Juniper, MD;  Location: [facility];  Service: Gastroenterology;  Laterality: N/A;  . DILATION AND CURETTAGE OF UTERUS  1974  . ESOPHAGOGASTRODUODENOSCOPY  05/2010   Dr. Tessie Fass, erosion. 86F dilation  . ESOPHAGOGASTRODUODENOSCOPY N/A 04/29/2014   Dr.Rourk- normal esophagus s/p passage of maloney dilator. small hiatal hernia- bx= chronic inflammation.  . ESOPHAGOGASTRODUODENOSCOPY (EGD) WITH PROPOFOL N/A 12/23/2019   Procedure: ESOPHAGOGASTRODUODENOSCOPY (EGD) WITH PROPOFOL;  Surgeon: Ronnette Juniper, MD;  Location: [facility];  Service: Gastroenterology;  Laterality: N/A;  . GIVENS CAPSULE STUDY N/A 03/22/2020   Procedure: GIVENS CAPSULE STUDY;  Surgeon: Daneil Dolin, MD;  [facility];  Service: Endoscopy;  Laterality: N/A;  7:30am  . HEMOSTASIS CLIP PLACEMENT  12/23/2019   Procedure: HEMOSTASIS CLIP PLACEMENT;  Surgeon: Ronnette Juniper, MD;  Location: [facility];  Service: Gastroenterology;;  . HOT HEMOSTASIS N/A 12/23/2019   Procedure: HOT HEMOSTASIS (ARGON PLASMA COAGULATION/BICAP);  Surgeon: Ronnette Juniper, MD;  Location: [facility];  Service: Gastroenterology;  Laterality: N/A;  . LEFT HEART CATH AND CORONARY ANGIOGRAPHY N/A 12/24/2019   Procedure: LEFT HEART CATH AND CORONARY ANGIOGRAPHY;  Surgeon: Nigel Mormon, MD;  Location: [facility] CV LAB;  Service: Cardiovascular;  Laterality: N/A;  . Venia Minks DILATION N/A 04/29/2014   Procedure: Venia Minks DILATION;  Surgeon: Daneil Dolin, MD;  [facility];  Service: Endoscopy;  Laterality: N/A;  . PARTIAL HYSTERECTOMY  1978  . POLYPECTOMY  12/23/2019   Procedure: POLYPECTOMY;  Surgeon: Ronnette Juniper, MD;  Location: [facility] ENDOSCOPY;  Service: Gastroenterology;;  . SHOULDER SURGERY Left    Left shoulder for RTC;left arm surgery '98/left hand surgery 2001  . TOTAL ABDOMINAL HYSTERECTOMY W/ BILATERAL SALPINGOOPHORECTOMY  2002  . UMBILICAL HERNIA REPAIR  2008  . VIDEO BRONCHOSCOPY WITH ENDOBRONCHIAL  ULTRASOUND N/A 05/20/2020   Procedure: VIDEO BRONCHOSCOPY WITH ENDOBRONCHIAL ULTRASOUND;  Surgeon: Collene Gobble, MD;  Location: [facility] ENDOSCOPY;  Service: Pulmonary;  Laterality:  N/A;    REVIEW OF SYSTEMS:  Constitutional: negative Eyes: negative Ears, nose, mouth, throat, and face: negative Respiratory: negative Cardiovascular: negative Gastrointestinal: negative Genitourinary:negative Integument/breast: negative Hematologic/lymphatic: negative Musculoskeletal:negative Neurological: negative Behavioral/Psych: negative Endocrine: negative Allergic/Immunologic: negative   PHYSICAL EXAMINATION: General appearance: alert, cooperative, fatigued and no distress Head: Normocephalic, without obvious abnormality, atraumatic Neck: no adenopathy, no JVD, supple, symmetrical, trachea midline and thyroid not enlarged, symmetric, no tenderness/mass/nodules Lymph nodes: Cervical, supraclavicular, and axillary nodes normal. Resp: clear to auscultation bilaterally Back: symmetric, no curvature. ROM normal. No CVA tenderness. Cardio: regular rate and rhythm, S1, S2 normal, no murmur, click, rub or gallop GI: soft, non-tender; bowel sounds normal; no masses,  no organomegaly Extremities: extremities normal, atraumatic, no cyanosis or edema Neurologic: Alert and oriented X 3, normal strength and tone. Normal symmetric reflexes. Normal coordination and gait  ECOG PERFORMANCE STATUS: 1 - Symptomatic but completely ambulatory  Blood pressure (!) 120/50, pulse 100, temperature (!) 96.4 F (35.8 C), temperature source Tympanic, resp. rate 18, height 5' 6"  (1.676 m), weight 140 lb 11.2 oz (63.8 kg), SpO2 98 %.  LABORATORY DATA: Lab Results  Component Value Date   WBC 5.4 08/16/2020   HGB 7.5 (L) 08/16/2020   HCT 23.4 (L) 08/16/2020   MCV 106.4 (H) 08/16/2020   PLT 218 08/16/2020      Chemistry      Component Value Date/Time   NA 135 08/16/2020 1040   NA 138 05/25/2020 0806   K 5.5 (H)  08/16/2020 1040   CL 104 08/16/2020 1040   CO2 25 08/16/2020 1040   BUN 23 08/16/2020 1040   BUN 26 05/25/2020 0806   CREATININE 1.35 (H) 08/16/2020 1040   CREATININE 0.85 05/03/2012 0942      Component Value Date/Time   CALCIUM 9.6 08/16/2020 1040   ALKPHOS 70 08/16/2020 1040   AST 15 08/16/2020 1040   ALT 13 08/16/2020 1040   BILITOT 0.2 (L) 08/16/2020 1040       RADIOGRAPHIC STUDIES: CT Chest W Contrast  Result Date: 09/03/2020 CLINICAL DATA:  Small cell lung cancer with brain metastases, chemotherapy/XRT complete EXAM: CT CHEST WITH CONTRAST TECHNIQUE: Multidetector CT imaging of the chest was performed during intravenous contrast administration. CONTRAST:  57m OMNIPAQUE IOHEXOL 300 MG/ML  SOLN COMPARISON:  07/14/2020 FINDINGS: Cardiovascular: The heart is normal in size. No pericardial effusion. No evidence of thoracic aortic aneurysm. Atherosclerotic calcifications of the aortic arch. Three vessel coronary atherosclerosis. Mediastinum/Nodes: Small mediastinal lymph nodes which do not meet pathologic CT size criteria, including a dominant 9 mm short axis subcarinal node, previously 10 mm. Additional 12 mm short axis right hilar node, previously 13 mm. These are presumed to reflect improving nodal metastases in this clinical context. Visualized thyroid is unremarkable. Lungs/Pleura: 2.3 x 3.5 cm mass in the posterior right middle lobe (series 5/image 87), previously 2.6 x 4.3 cm, corresponding to the patient's known primary bronchogenic neoplasm. Associated subpleural reticulation/fibrosis in the lungs bilaterally, likely reflecting superimposed chronic interstitial lung disease. No focal consolidation. No pleural effusion or pneumothorax. Upper Abdomen: Visualized upper abdomen is notable for a mildly nodular hepatic contour, raising the possibility of cirrhosis. Prior cholecystectomy. Vascular calcifications. Musculoskeletal: Degenerative changes of the visualized thoracolumbar spine.  IMPRESSION: 3.5 cm mass in the posterior right middle lobe, corresponding to the patient's known primary bronchogenic neoplasm, decreased. Small thoracic nodal metastases, mildly improved. Aortic Atherosclerosis (ICD10-I70.0). Electronically Signed   By: SJulian HyM.D.   On: 09/03/2020 09:33    ASSESSMENT  AND PLAN: This is a very pleasant 69 years old white female recently diagnosed with extensive stage small cell lung cancer (T3, N2, M1 C) presented with right middle lobe lung mass in addition to right hilar and low right paratracheal lymphadenopathy in addition to tiny solitary brain metastasis diagnosed in August 2021. The patient is currently undergoing systemic chemotherapy with carboplatin for AUC of 5 on day 1, etoposide 100 mg/M2 with Neulasta support.  Status post 4 cycles.  This was concurrent with radiotherapy.  The patient tolerated her treatment fairly well with no concerning adverse effects. She had repeat CT scan of the chest performed recently.  I personally and independently reviewed the scan images and discussed the results with the patient and her friend.  Her scan showed no concerning findings for disease progression and there was further improvement in the primary bronchogenic neoplasm. I gave the patient the option of proceeding with 2 more cycles of systemic chemotherapy to complete a total of 6 cycles versus observation and close monitoring. She would like to stop her systemic chemotherapy at this point. I also recommend for the patient to discuss with Dr. [name] consideration of prophylactic cranial irradiation. The patient will come back for follow-up visit in 3 months for evaluation with repeat CT scan of the chest for restaging of her disease. She was advised to call immediately if she has any concerning symptoms in the interval. The patient voices understanding of current disease status and treatment options and is in agreement with the current care plan.  All  questions were answered. The patient knows to call the clinic with any problems, questions or concerns. We can certainly see the patient much sooner if necessary.  Disclaimer: This note was dictated with voice recognition software. Similar sounding words can inadvertently be transcribed and may not be corrected upon review.

## 2020-09-06 ENCOUNTER — Encounter: Payer: Self-pay | Admitting: Internal Medicine

## 2020-09-06 ENCOUNTER — Ambulatory Visit: Payer: Medicare HMO

## 2020-09-07 ENCOUNTER — Telehealth: Payer: Self-pay | Admitting: Internal Medicine

## 2020-09-07 ENCOUNTER — Ambulatory Visit: Payer: Medicare HMO

## 2020-09-07 ENCOUNTER — Inpatient Hospital Stay: Payer: Medicare HMO

## 2020-09-07 NOTE — Telephone Encounter (Signed)
Scheduled per los. Called, not able to leave msg. Mailed printout  

## 2020-09-12 ENCOUNTER — Inpatient Hospital Stay: Payer: Medicare HMO | Admitting: Internal Medicine

## 2020-09-12 ENCOUNTER — Other Ambulatory Visit: Payer: Self-pay

## 2020-09-12 ENCOUNTER — Other Ambulatory Visit: Payer: Self-pay | Admitting: Medical Oncology

## 2020-09-12 ENCOUNTER — Ambulatory Visit
Admission: RE | Admit: 2020-09-12 | Discharge: 2020-09-12 | Disposition: A | Discharge status: Home / Self Care | Payer: Medicare HMO | Source: Ambulatory Visit | Attending: Radiation Oncology | Admitting: Radiation Oncology

## 2020-09-12 ENCOUNTER — Inpatient Hospital Stay: Payer: Medicare HMO

## 2020-09-12 ENCOUNTER — Telehealth: Payer: Self-pay | Admitting: Medical Oncology

## 2020-09-12 VITALS — BP 110/47 | HR 76 | Temp 97.6°F | Resp 18 | Ht 66.0 in | Wt 140.1 lb

## 2020-09-12 DIAGNOSIS — C342 Malignant neoplasm of middle lobe, bronchus or lung: Secondary | ICD-10-CM

## 2020-09-12 DIAGNOSIS — D649 Anemia, unspecified: Secondary | ICD-10-CM

## 2020-09-12 MED ORDER — DIPHENHYDRAMINE HCL 50 MG PO CAPS
ORAL_CAPSULE | ORAL | 0 refills | Status: DC
Start: 2020-09-12 — End: 2020-11-23

## 2020-09-12 MED ORDER — PREDNISONE 50 MG PO TABS: ORAL_TABLET | ORAL | 0 refills | Status: DC

## 2020-09-12 NOTE — Telephone Encounter (Signed)
Pt did come to appt today.

## 2020-09-12 NOTE — Progress Notes (Signed)
Radiation Oncology         [phone] ________________________________  Name: [name] MRN: [id number]  Date: 09/12/2020  DOB: 1951-01-15  Follow-Up Visit Note  CC: [name], MD  [name], MD    ICD-10-CM   1. 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 Since the end of treatment, the patient completed four cycles of palliative systemic chemotherapy with Carboplatin and Etoposide under the care of Dr. [name]. She tolerated treatment relatively well with the exception of fatigue.  Chest CT scan on 09/02/2020 showed a 3.5 cm, decreased mass in the posterior right middle lobe that corresponded to the patient's known primary bronchogenic neoplasm. The small thoracic nodal metastases had mildly improved.    On review of systems, she reports minimal fatigue at this time. She denies breathing problems or swallowing difficulties.  ALLERGIES:  is allergic to iohexol, tape, ciprofloxacin, latex, penicillins, and povidone-iodine.  Meds: Current Outpatient Medications  Medication Sig Dispense Refill  . albuterol (PROVENTIL HFA;VENTOLIN HFA) 108 (90 BASE) MCG/ACT inhaler Inhale 2 puffs into the lungs every 4 (four) hours as needed for wheezing or shortness of breath. 1 Inhaler 0  . aspirin-acetaminophen-caffeine (EXCEDRIN MIGRAINE) 250-250-65 MG tablet Take 2 tablets by mouth daily as needed for headache. Up to 4 times a week    . azelastine  (OPTIVAR) 0.05 % ophthalmic solution Place 1 drop into both eyes 2 (two) times daily.    Marland Kitchen b complex vitamins tablet Take 1 tablet by mouth daily. With Zinc and vitamin C    . Benzocaine (BOIL-EASE EX) Apply 1 application topically daily as needed (Boil).    . Biotin 5000 MCG TABS Take 10,000 mcg by mouth daily. Keratin    . carbamide peroxide (DEBROX) 6.5 % OTIC solution 5 drops daily as needed.    . Cholecalciferol (VITAMIN D3) 50 MCG (2000 UT) TABS Take 4,000 Units by mouth daily.    . Cyanocobalamin 2500 MCG CHEW Chew 2,500 mcg by mouth daily.    . ferrous sulfate 325 (65 FE) MG tablet Take 325 mg by mouth daily with breakfast.    . Fluticasone-Umeclidin-Vilant (TRELEGY ELLIPTA) 100-62.5-25 MCG/INH AEPB Inhale 1 puff into the lungs daily. 28 each 5  . furosemide (LASIX) 40 MG tablet Take 1 tablet (40 mg total) by mouth 2 (two) times daily. (Patient taking differently: Take 40 mg by mouth daily. ) 30 tablet 1  . Ginkgo Biloba Extract (GNP GINGKO BILOBA EXTRACT) 60 MG CAPS Take 2 capsules by mouth daily.     . hydrocortisone cream 1 % Apply 1 application topically daily as needed for itching.    Marland Kitchen Hydrocortisone, Perianal, (PROCTO-PAK) 1 % CREA Apply anorectally twice daily for two weeks. 28 g 0  . ibuprofen (ADVIL) 200 MG tablet Take 200-600 mg by mouth every 6 (six) hours as needed for moderate pain.    Marland Kitchen LANTUS SOLOSTAR 100 UNIT/ML Solostar Pen Inject 16 Units into the skin daily.     Marland Kitchen  levothyroxine (SYNTHROID) 100 MCG tablet Take 112 mcg by mouth daily before breakfast.     . LORazepam (ATIVAN) 0.5 MG tablet Take 1 tablet 30 minutes before the MRI.  Repeat once if needed. 2 tablet 0  . losartan (COZAAR) 50 MG tablet Take 1 tablet (50 mg total) by mouth daily. 30 tablet 5  . Menthol, Topical Analgesic, (ICY HOT) 7.5 % (Roll) MISC Apply 1 each topically daily as needed (Pain). spray     . metFORMIN (GLUCOPHAGE) 500 MG tablet Take 500 mg by mouth 2 (two) times daily with a meal.     .  mirtazapine (REMERON) 15 MG tablet Take 7.5 mg by mouth at bedtime.    . Multiple Vitamins-Minerals (MULTIVITAMIN WITH MINERALS) tablet Take 1 tablet by mouth daily. Adult 50+    . neomycin-bacitracin-polymyxin (NEOSPORIN) OINT Apply 1 application topically daily as needed for irritation or wound care.    . Nitroglycerin 0.4 % OINT Place one inch anorectally with a gloved finger every 12 hours for up to 3 weeks for anal fissure. Apply with a gloved finger to prevent absorption of nitroglycerin through the skin which can lead to headache. 30 g 0  . Omega 3-6-9 Fatty Acids (OMEGA 3-6-9 COMPLEX PO) Take 2 tablets by mouth daily.    Marland Kitchen oxyCODONE-acetaminophen (PERCOCET/ROXICET) 5-325 MG tablet Take 0.5 tablets by mouth 2 (two) times daily as needed for severe pain.    . pantoprazole (PROTONIX) 40 MG tablet Take 40 mg by mouth daily.    Marland Kitchen PARoxetine (PAXIL) 20 MG tablet Take 20 mg by mouth daily.     . pravastatin (PRAVACHOL) 40 MG tablet Take 40 mg by mouth daily.    . prochlorperazine (COMPAZINE) 10 MG tablet Take 1 tablet (10 mg total) by mouth every 6 (six) hours as needed. 30 tablet 2  . pyridOXINE (VITAMIN B-6) 50 MG tablet Take 200 mg by mouth daily.    . rivaroxaban (XARELTO) 20 MG TABS tablet Take 1 tablet (20 mg total) by mouth daily with supper. 30 tablet 4  . rosuvastatin (CRESTOR) 10 MG tablet Take 1 tablet (10 mg total) by mouth daily. 90 tablet 3  . traZODone (DESYREL) 150 MG tablet Take 150 mg by mouth at bedtime.     . colestipol (COLESTID) 5 g packet Take 3 g by mouth daily. Do not take within 2 hours of other medications. 18 packet 0  . diphenhydrAMINE (BENADRYL) 50 MG capsule Take 48m 1 hour before scheduled CT (Patient not taking: Reported on 09/12/2020) 1 capsule 0  . predniSONE (DELTASONE) 50 MG tablet Take 50 mg 13 hours, 7 hours, and 2 hours before scheduled CT scan (Patient not taking: Reported on 09/12/2020) 3 tablet 0  . sucralfate (CARAFATE) 1 g tablet Take 1 tablet (1 g  total) by mouth 4 (four) times daily -  with meals and at bedtime. Crush and dissolve in 15 mL of warm water prior to swallowing (Patient not taking: Reported on 09/12/2020) 120 tablet 1   No current facility-administered medications for this encounter.    Physical Findings: The patient is in no acute distress. Patient is alert and oriented.  height is 5' 6"  (1.676 m) and weight is 140 lb 2 oz (63.6 kg). Her temporal temperature is 97.6 F (36.4 C). Her blood pressure is 110/47 (abnormal) and her pulse is 76. Her respiration is 18 and oxygen saturation is 98%.  Lungs are clear to auscultation bilaterally. Heart has regular rate and rhythm. No palpable cervical,  supraclavicular, or axillary adenopathy. Abdomen soft, non-tender, normal bowel sounds.   Lab Findings: Lab Results  Component Value Date   WBC 10.1 09/05/2020   HGB 8.0 (L) 09/05/2020   HCT 24.3 (L) 09/05/2020   MCV 109.5 (H) 09/05/2020   PLT 82 (L) 09/05/2020    Radiographic Findings: CT Chest W Contrast  Result Date: 09/03/2020 CLINICAL DATA:  Small cell lung cancer with brain metastases, chemotherapy/XRT complete EXAM: CT CHEST WITH CONTRAST TECHNIQUE: Multidetector CT imaging of the chest was performed during intravenous contrast administration. CONTRAST:  84m OMNIPAQUE IOHEXOL 300 MG/ML  SOLN COMPARISON:  07/14/2020 FINDINGS: Cardiovascular: The heart is normal in size. No pericardial effusion. No evidence of thoracic aortic aneurysm. Atherosclerotic calcifications of the aortic arch. Three vessel coronary atherosclerosis. Mediastinum/Nodes: Small mediastinal lymph nodes which do not meet pathologic CT size criteria, including a dominant 9 mm short axis subcarinal node, previously 10 mm. Additional 12 mm short axis right hilar node, previously 13 mm. These are presumed to reflect improving nodal metastases in this clinical context. Visualized thyroid is unremarkable. Lungs/Pleura: 2.3 x 3.5 cm mass in the posterior right middle  lobe (series 5/image 87), previously 2.6 x 4.3 cm, corresponding to the patient's known primary bronchogenic neoplasm. Associated subpleural reticulation/fibrosis in the lungs bilaterally, likely reflecting superimposed chronic interstitial lung disease. No focal consolidation. No pleural effusion or pneumothorax. Upper Abdomen: Visualized upper abdomen is notable for a mildly nodular hepatic contour, raising the possibility of cirrhosis. Prior cholecystectomy. Vascular calcifications. Musculoskeletal: Degenerative changes of the visualized thoracolumbar spine. IMPRESSION: 3.5 cm mass in the posterior right middle lobe, corresponding to the patient's known primary bronchogenic neoplasm, decreased. Small thoracic nodal metastases, mildly improved. Aortic Atherosclerosis (ICD10-I70.0). Electronically Signed   By: SJulian HyM.D.   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  The patient is recovering from the effects of radiation. Most recent chest CT scan showed a reduction in the size of the right middle lobe mass as well as the small thoracic nodal metastases.    Plan:  The patient is scheduled to follow up with Dr. MJulien Nordmannon 12/06/2020.  The patient was inquiring about her metastasis to the brain and would like to proceed with an MRI of the brain.  She does have a contrast allergy and will premedicate prior to this scan as recommended by the [employer] [facility].  ____________________________________   JBlair Promise PhD, MD  This document serves as a record of services personally performed by JGery Pray MD. It was created on his behalf by MClerance Lav a trained medical scribe. The creation of this record is based on the scribe's personal observations and the provider's statements  to them. This document has been checked and approved by the attending provider.

## 2020-09-12 NOTE — Progress Notes (Incomplete)
  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 She did report a weight loss of approximately 40 lbs over six months. She also reported a knot on sternum/near diaphragm, mild fatigue, a dry cough in the mornings and evenings, back pain secondary to fall, mid chest pain secondary to clipping tree branches, some mild difficulty swallowing, a burning sensation of the skin on her mid chest, and headaches that improved. She denied shortness of breath, poor appetite, and reflux. Palpation along the lower rib cage area revealed a prominent rib along the right side without point tenderness; likely related to significant weight loss and patient was just noticing that area. Towards the end of treatment, the patient was noted to have some erythema and mild hyperpigmentation changes along the treatment region without skin breakdown. This improved by the end of treatment.  Of note, the patient underwent a restaging chest CT scan on 07/14/2020 that showed an interval decrease in the size of the FDG  avid mass of the lateral segment of the right middle lobe. There was also an interval reduction in size of the enlarged, FDG avid pretracheal and right hilar lymph nodes. Findings were consistent with treatment response of primary lung mass and nodal metastatic disease. 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   Plan: The patient will follow-up with radiation oncology in one month.  ________________________________________________   Blair Promise, PhD, MD  This document serves as a record of services personally performed by [name], MD. It was created on his behalf by Clerance Lav, a trained medical scribe. The creation of this record is based on the scribe's personal observations and the provider's statements to them. This document has been checked and approved by the attending provider.

## 2020-09-12 NOTE — Progress Notes (Signed)
Patient is here today for 1 month follow up pot radiation to chest for small lung cell cancer.  Denies having any shortness of breath.  Denies having any pain.  Reports completed chemotherapy under Dr. [name] and is following with him.  Denies having any skin issues related to radiation.    Patient states that she previously had a spot show up on CT head and was unsure if that would be followed up on.  Today's Vitals   09/12/20 1149  BP: (!) 110/47  Pulse: 76  Resp: 18  Temp: 97.6 F (36.4 C)  TempSrc: Temporal  SpO2: 98%  Weight: 140 lb 2 oz (63.6 kg)  Height: 5' 6"  (1.676 m)   Body mass index is 22.62 kg/m.

## 2020-09-13 ENCOUNTER — Telehealth: Payer: Self-pay | Admitting: *Deleted

## 2020-09-13 ENCOUNTER — Inpatient Hospital Stay: Payer: Medicare HMO

## 2020-09-13 NOTE — Telephone Encounter (Signed)
CALLED PATIENT TO INFORM OF FEB. 28 FU WITH DR. [place] ON 12-12-20 @ 11:45 AM, SPOKE WITH PATIENT AND SHE IS AWARE OF THIS APPT.

## 2020-09-14 ENCOUNTER — Ambulatory Visit: Payer: Medicare HMO

## 2020-09-14 IMAGING — DX DG CHEST 2V
2 series · 2 of 2 positions shown · non-contrast
Comparison: 12/21/2019

CLINICAL DATA: Weakness, low blood pressure

EXAM:
CHEST - 2 VIEW

[chest pa]
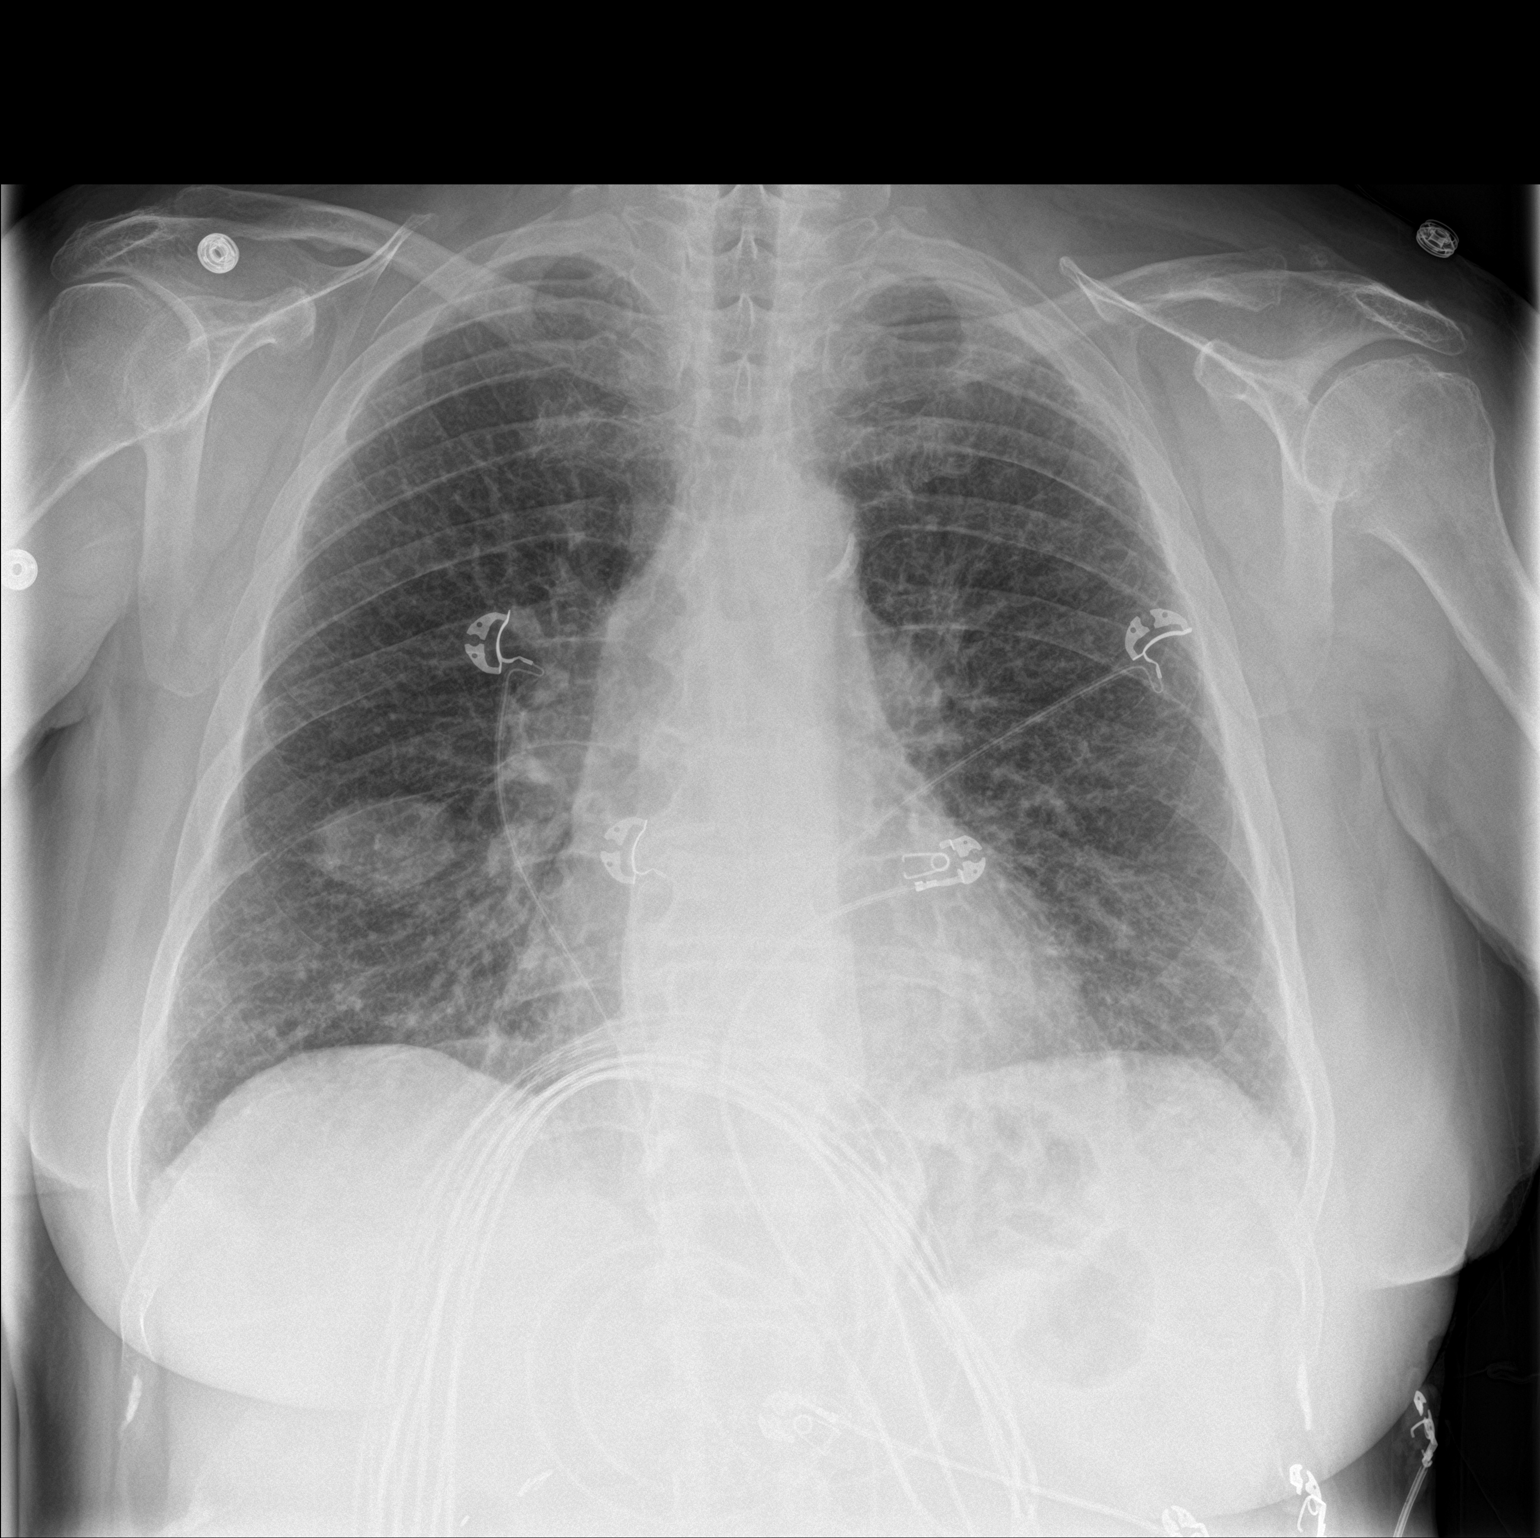

[chest lat]
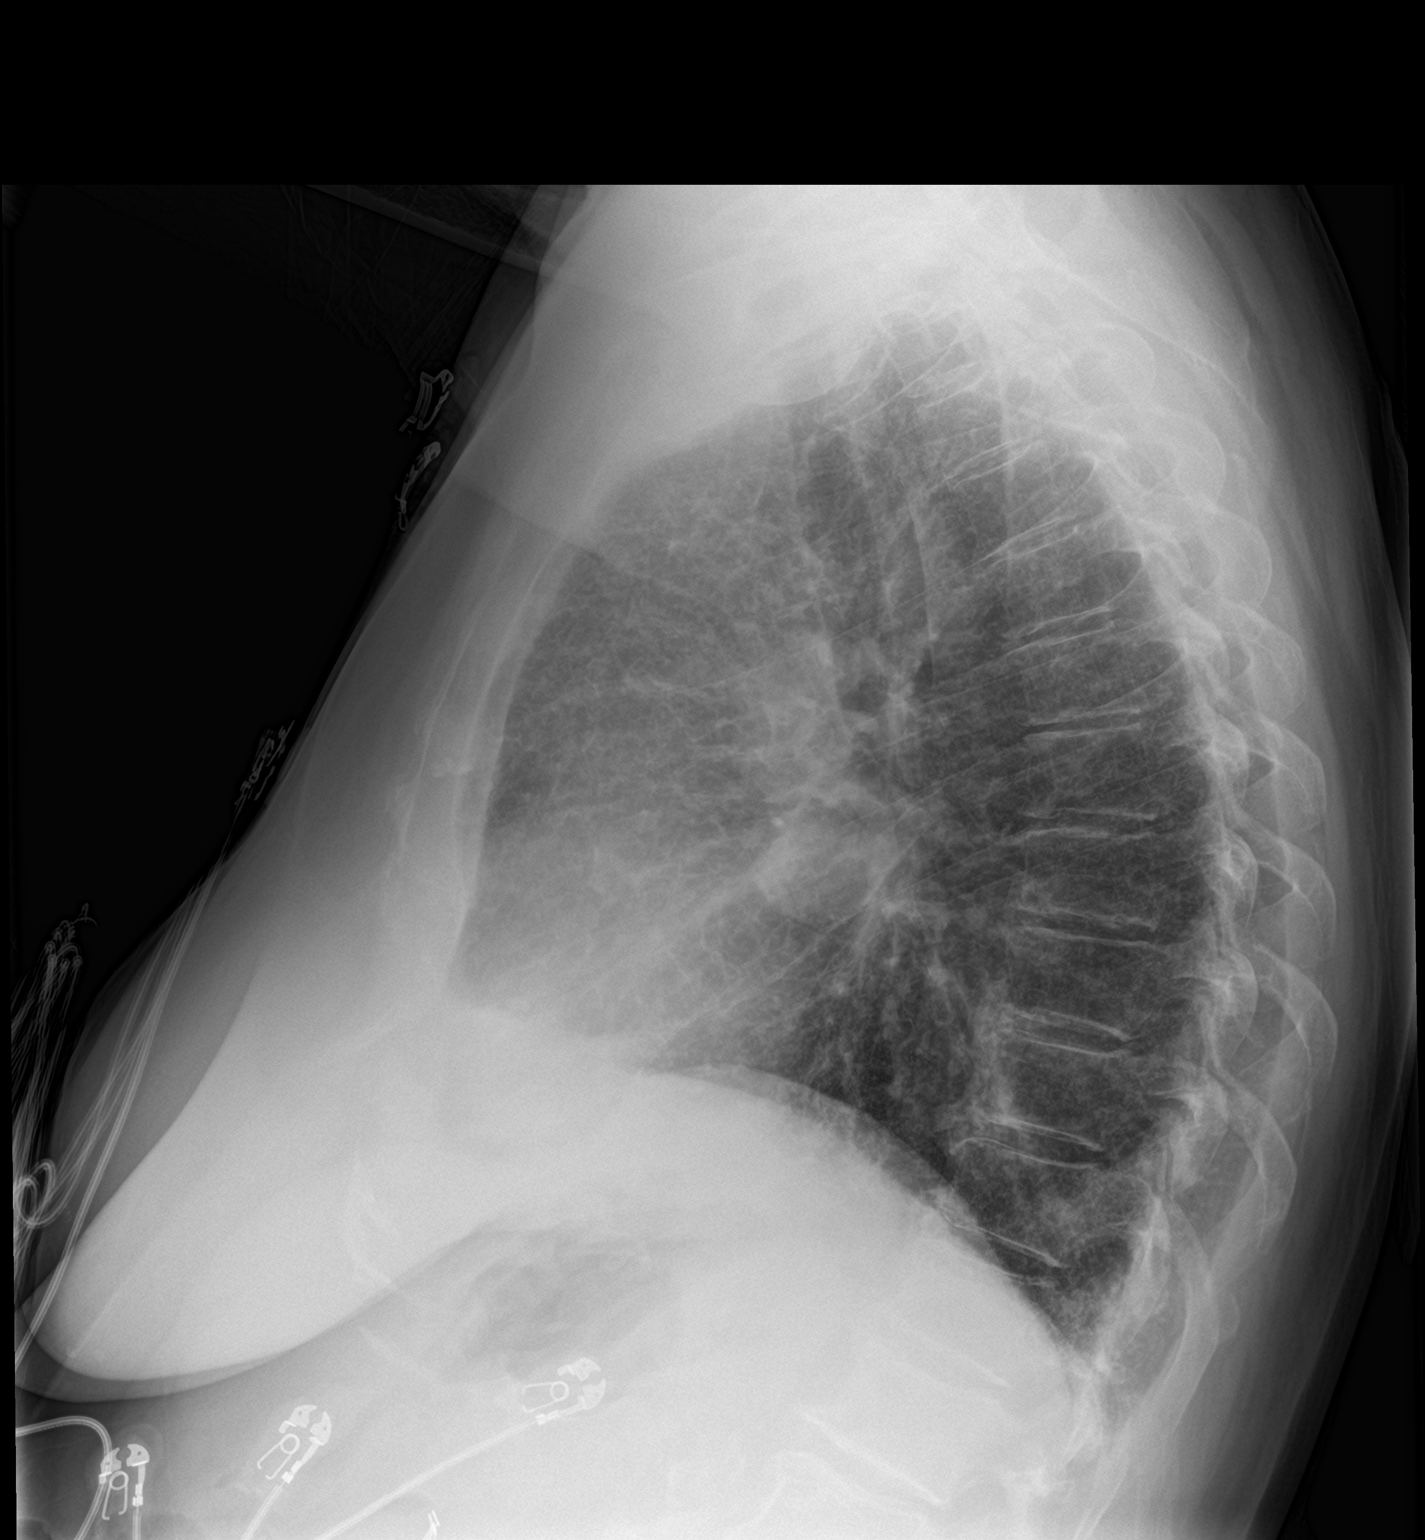

[2 of 2 positions shown; findings below may reference images not displayed]

FINDINGS: Mild cardiomegaly. Unchanged masslike opacity of the right midlung.
Unchanged mild, diffuse interstitial opacity. Disc degenerative
disease of the thoracic spine.
IMPRESSION: 1. Unchanged mild, diffuse interstitial opacity, which may reflect
edema. No new airspace opacity.

2.  Unchanged masslike opacity of the right midlung.

## 2020-09-21 ENCOUNTER — Other Ambulatory Visit: Payer: Self-pay

## 2020-09-21 ENCOUNTER — Ambulatory Visit (HOSPITAL_COMMUNITY)
Admission: RE | Admit: 2020-09-21 | Discharge: 2020-09-21 | Disposition: A | Discharge status: Home / Self Care | Payer: Medicare HMO | Source: Ambulatory Visit | Attending: Radiation Oncology | Admitting: Radiation Oncology

## 2020-09-21 DIAGNOSIS — C3490 Malignant neoplasm of unspecified part of unspecified bronchus or lung: Secondary | ICD-10-CM | POA: Diagnosis not present

## 2020-09-21 DIAGNOSIS — C342 Malignant neoplasm of middle lobe, bronchus or lung: Secondary | ICD-10-CM

## 2020-09-21 MED ORDER — GADOBUTROL 1 MMOL/ML IV SOLN
6.0000 mL | Freq: Once | INTRAVENOUS | Status: AC | PRN
  Administered 2020-09-21: 6 mL via INTRAVENOUS

## 2020-09-23 IMAGING — DX DG CHEST 1V PORT
1 series · 1 of 1 positions shown · non-contrast
Comparison: 12/31/2019

CLINICAL DATA: Pt reports was discharged approx 1 week ago. Reports
was sent [REDACTED] for chest pain and was told she had a 100%
blockage. Reports they weren't able to put in a stent. Persistent
chest pain

EXAM:
PORTABLE CHEST - 1 VIEW

[chest ap]
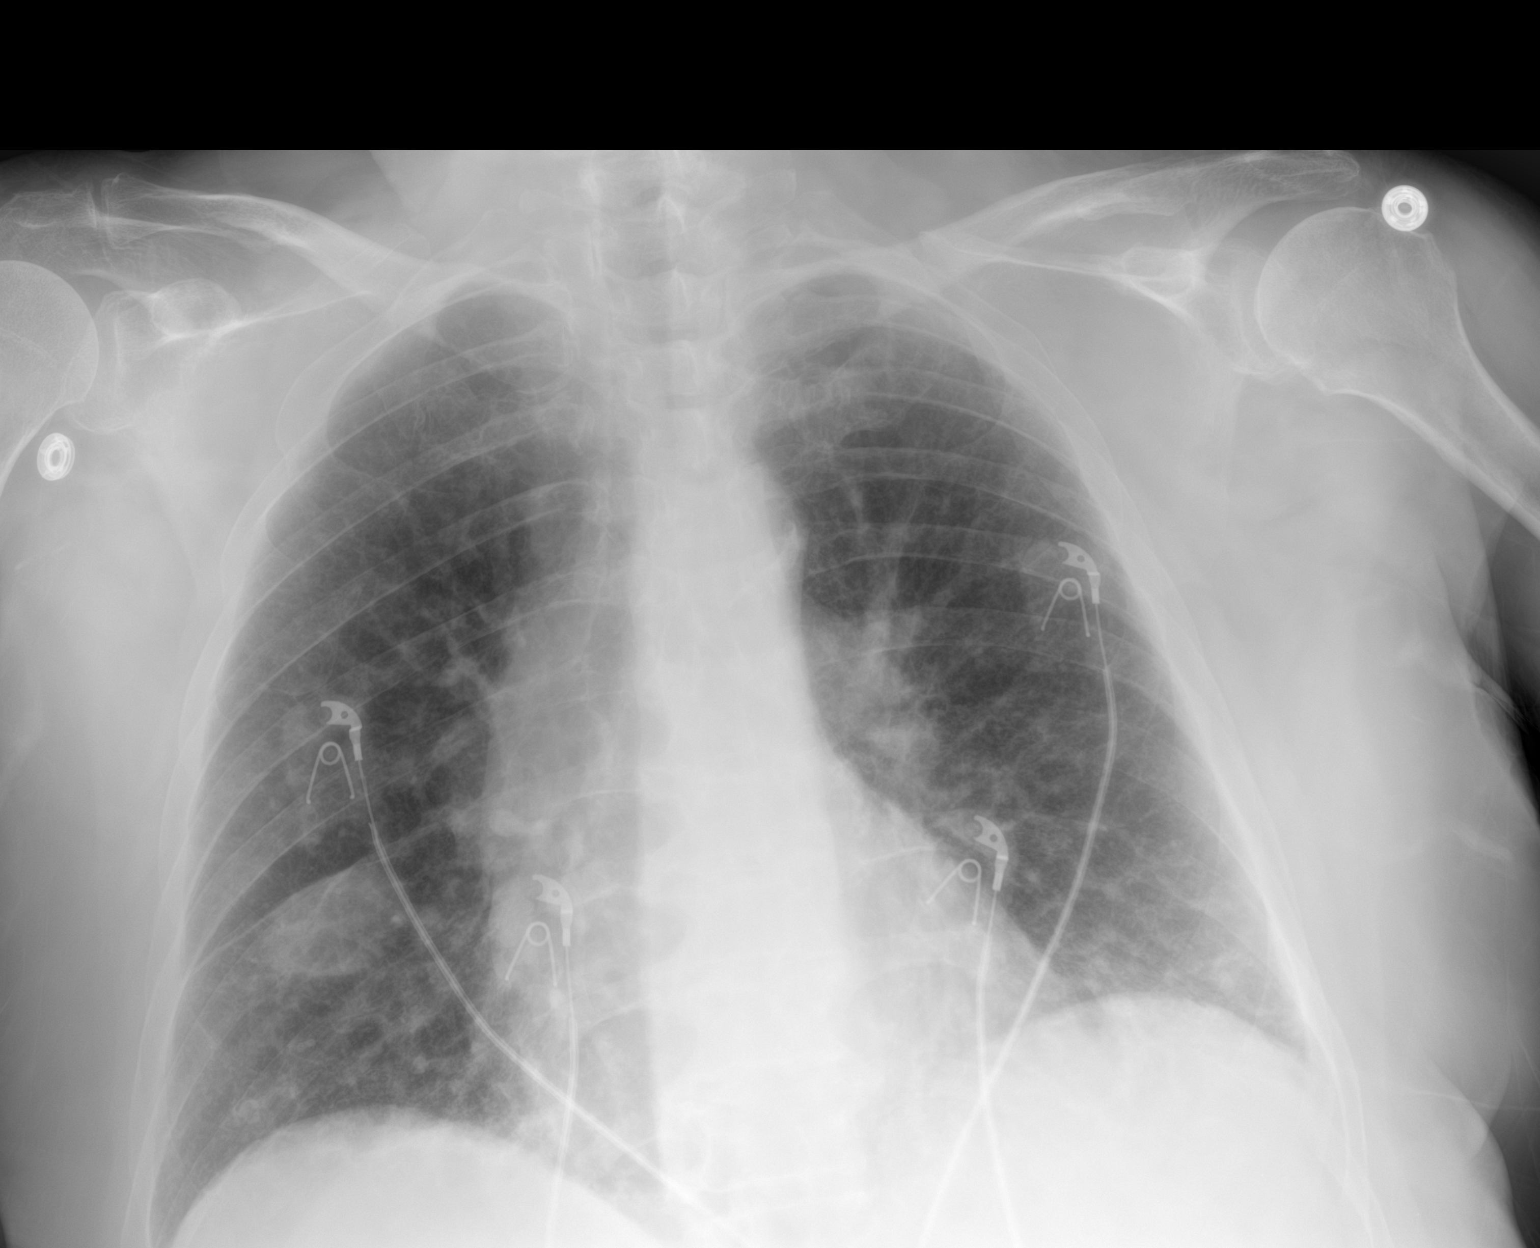

[1 of 1 positions shown; findings below may reference images not displayed]

FINDINGS: 4.1 cm masslike consolidation adjacent to the minor fissure as
before. No new infiltrate or overt edema.

Heart size and mediastinal contours are within normal limits. Aortic
Atherosclerosis ([id number]).

No effusion. No pneumothorax.

Visualized bones unremarkable.
IMPRESSION: Persistent masslike consolidation adjacent to the minor fissure. No
acute findings.

## 2020-09-30 ENCOUNTER — Other Ambulatory Visit: Payer: Self-pay | Admitting: Radiation Oncology

## 2020-09-30 ENCOUNTER — Telehealth: Payer: Self-pay

## 2020-09-30 DIAGNOSIS — C342 Malignant neoplasm of middle lobe, bronchus or lung: Secondary | ICD-10-CM

## 2020-09-30 NOTE — Telephone Encounter (Signed)
Returned patient's call who states she needed Dr. [name] to call her with her MRI results from 09/21/2020. Patient advised that he will be contacted and will call her back with this information. Patient verbalized understanding.

## 2020-10-04 DIAGNOSIS — F339 Major depressive disorder, recurrent, unspecified: Secondary | ICD-10-CM | POA: Diagnosis not present

## 2020-10-04 DIAGNOSIS — F17200 Nicotine dependence, unspecified, uncomplicated: Secondary | ICD-10-CM | POA: Diagnosis not present

## 2020-10-04 DIAGNOSIS — F411 Generalized anxiety disorder: Secondary | ICD-10-CM | POA: Diagnosis not present

## 2020-10-14 DIAGNOSIS — E785 Hyperlipidemia, unspecified: Secondary | ICD-10-CM | POA: Diagnosis not present

## 2020-10-14 DIAGNOSIS — K7469 Other cirrhosis of liver: Secondary | ICD-10-CM | POA: Diagnosis not present

## 2020-10-14 DIAGNOSIS — E1165 Type 2 diabetes mellitus with hyperglycemia: Secondary | ICD-10-CM | POA: Diagnosis not present

## 2020-10-14 DIAGNOSIS — I10 Essential (primary) hypertension: Secondary | ICD-10-CM | POA: Diagnosis not present

## 2020-10-17 ENCOUNTER — Encounter: Payer: Self-pay | Admitting: Obstetrics & Gynecology

## 2020-10-17 ENCOUNTER — Other Ambulatory Visit: Payer: Self-pay

## 2020-10-17 ENCOUNTER — Ambulatory Visit: Payer: Medicare HMO | Admitting: Obstetrics & Gynecology

## 2020-10-17 VITALS — BP 138/69 | HR 92 | Ht 66.75 in | Wt 138.0 lb

## 2020-10-17 DIAGNOSIS — N764 Abscess of vulva: Secondary | ICD-10-CM

## 2020-10-17 MED ORDER — SULFAMETHOXAZOLE-TRIMETHOPRIM 800-160 MG PO TABS: 1.0000 | ORAL_TABLET | Freq: Two times a day (BID) | ORAL | 0 refills | Status: DC

## 2020-10-17 MED ORDER — SILVER SULFADIAZINE 1 % EX CREA: TOPICAL_CREAM | CUTANEOUS | 11 refills | Status: DC

## 2020-10-17 NOTE — Progress Notes (Signed)
Chief Complaint  Patient presents with  . Gynecologic Exam      70 y.o. [id number] No LMP recorded. Patient has had a hysterectomy. The current method of family planning is status post hysterectomy.  Outpatient Encounter Medications as of 10/17/2020  Medication Sig  . albuterol (PROVENTIL HFA;VENTOLIN HFA) 108 (90 BASE) MCG/ACT inhaler Inhale 2 puffs into the lungs every 4 (four) hours as needed for wheezing or shortness of breath.  Marland Kitchen aspirin-acetaminophen-caffeine (EXCEDRIN MIGRAINE) 250-250-65 MG tablet Take 2 tablets by mouth daily as needed for headache. Up to 4 times a week  . b complex vitamins tablet Take 1 tablet by mouth daily. With Zinc and vitamin C  . Benzocaine (BOIL-EASE EX) Apply 1 application topically daily as needed (Boil).  . Biotin 5000 MCG TABS Take 10,000 mcg by mouth daily. Keratin  . carbamide peroxide (DEBROX) 6.5 % OTIC solution 5 drops daily as needed.  . Cholecalciferol (VITAMIN D3) 50 MCG (2000 UT) TABS Take 4,000 Units by mouth daily.  . Cyanocobalamin 2500 MCG CHEW Chew 2,500 mcg by mouth daily.  . diazepam (VALIUM) 5 MG tablet Take 5 mg by mouth daily as needed.  . ferrous sulfate 325 (65 FE) MG tablet Take 325 mg by mouth daily with breakfast.  . Fluticasone-Umeclidin-Vilant (TRELEGY ELLIPTA) 100-62.5-25 MCG/INH AEPB Inhale 1 puff into the lungs daily.  . furosemide (LASIX) 40 MG tablet Take 1 tablet (40 mg total) by mouth 2 (two) times daily. (Patient taking differently: Take 40 mg by mouth daily.)  . Ginkgo Biloba Extract 60 MG CAPS Take 2 capsules by mouth daily.   . hydrocortisone cream 1 % Apply 1 application topically daily as needed for itching.  Marland Kitchen Hydrocortisone, Perianal, (PROCTO-PAK) 1 % CREA Apply anorectally twice daily for two weeks.  Marland Kitchen LANTUS SOLOSTAR 100 UNIT/ML Solostar Pen Inject 16 Units into the skin daily.   Marland Kitchen levothyroxine (SYNTHROID) 100 MCG tablet Take 112 mcg by mouth daily before breakfast.   . metFORMIN (GLUCOPHAGE) 500 MG  tablet Take 500 mg by mouth 2 (two) times daily with a meal.   . Multiple Vitamins-Minerals (MULTIVITAMIN WITH MINERALS) tablet Take 1 tablet by mouth daily. Adult 50+  . neomycin-bacitracin-polymyxin (NEOSPORIN) OINT Apply 1 application topically daily as needed for irritation or wound care.  . Nitroglycerin 0.4 % OINT Place one inch anorectally with a gloved finger every 12 hours for up to 3 weeks for anal fissure. Apply with a gloved finger to prevent absorption of nitroglycerin through the skin which can lead to headache.  . Omega 3-6-9 Fatty Acids (OMEGA 3-6-9 COMPLEX PO) Take 2 tablets by mouth daily.  Marland Kitchen oxyCODONE-acetaminophen (PERCOCET/ROXICET) 5-325 MG tablet Take 0.5 tablets by mouth 2 (two) times daily as needed for severe pain.  . pantoprazole (PROTONIX) 40 MG tablet Take 40 mg by mouth daily.  Marland Kitchen PARoxetine (PAXIL) 20 MG tablet Take 20 mg by mouth daily.   . pravastatin (PRAVACHOL) 40 MG tablet Take 40 mg by mouth daily.  Marland Kitchen pyridOXINE (VITAMIN B-6) 50 MG tablet Take 200 mg by mouth daily.  . silver sulfADIAZINE (SILVADENE) 1 % cream Apply 2-3 times per day  . sulfamethoxazole-trimethoprim (BACTRIM DS) 800-160 MG tablet Take 1 tablet by mouth 2 (two) times daily.  . traZODone (DESYREL) 150 MG tablet Take 150 mg by mouth at bedtime.   . diphenhydrAMINE (BENADRYL) 50 MG capsule Take 58m 1 hour before scheduled CT (Patient not taking: Reported on 10/17/2020)  . ibuprofen (ADVIL) 200 MG tablet  Take 200-600 mg by mouth every 6 (six) hours as needed for moderate pain. (Patient not taking: Reported on 10/17/2020)  . LORazepam (ATIVAN) 0.5 MG tablet Take 1 tablet 30 minutes before the MRI.  Repeat once if needed. (Patient not taking: Reported on 10/17/2020)  . predniSONE (DELTASONE) 50 MG tablet Take 50 mg 13 hours, 7 hours, and 2 hours before scheduled CT scan (Patient not taking: Reported on 10/17/2020)  . prochlorperazine (COMPAZINE) 10 MG tablet Take 1 tablet (10 mg total) by mouth every 6 (six)  hours as needed. (Patient not taking: Reported on 10/17/2020)  . TechLite Lancets MISC   . [DISCONTINUED] azelastine (OPTIVAR) 0.05 % ophthalmic solution Place 1 drop into both eyes 2 (two) times daily.  . [DISCONTINUED] colestipol (COLESTID) 5 g packet Take 3 g by mouth daily. Do not take within 2 hours of other medications.  . [DISCONTINUED] losartan (COZAAR) 50 MG tablet Take 1 tablet (50 mg total) by mouth daily.  . [DISCONTINUED] Menthol, Topical Analgesic, (ICY HOT) 7.5 % (Roll) MISC Apply 1 each topically daily as needed (Pain). spray   . [DISCONTINUED] mirtazapine (REMERON) 15 MG tablet Take 7.5 mg by mouth at bedtime.  . [DISCONTINUED] rivaroxaban (XARELTO) 20 MG TABS tablet Take 1 tablet (20 mg total) by mouth daily with supper.  . [DISCONTINUED] rosuvastatin (CRESTOR) 10 MG tablet Take 1 tablet (10 mg total) by mouth daily.  . [DISCONTINUED] sucralfate (CARAFATE) 1 g tablet Take 1 tablet (1 g total) by mouth 4 (four) times daily -  with meals and at bedtime. Crush and dissolve in 15 mL of warm water prior to swallowing (Patient not taking: Reported on 09/12/2020)   No facility-administered encounter medications on file as of 10/17/2020.    Subjective Patient comes in complaining of a firm area in her bottom which began about a week ago It was nontender initially It then became tender No drainage No fevers and chills Never had before No antibiotics recently She is a diabetic but is on a minimal amount of Lantus with a normal hemoglobin A1c Past Medical History:  Diagnosis Date  . Allergic rhinitis   . 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 Anemia   . Anxiety   . ASCVD (arteriosclerotic cardiovascular disease)    MI in 96 requiring BMS CX; DES to M1 in 2000;normal coronary angiography in 2004  . Cholelithiasis   . COPD (chronic obstructive pulmonary disease) ([place])   . DDD (degenerative disc disease), lumbar   . Depression   . Diabetes mellitus    Type II  .  Diarrhea   . DVT (deep venous thrombosis) ([place])    patient said no  . GERD (gastroesophageal reflux disease)   . Headache    migraine  . History of kidney stones    2006  . Hyperlipidemia   . Hypotension   . Hypothyroidism   . Low back pain   . Myocardial infarction ([place]) 01/1995  . Nephrolithiasis 2006   stone extraction   . Panic attacks   . Peripheral neuropathy   . Peripheral vascular disease ([facility])    legs  . Pneumonia 12/22/2019  . Sciatic pain    right  . Small cell lung cancer ([place])   . Tobacco abuse   . Tremor     Past Surgical History:  Procedure Laterality Date  . AGILE CAPSULE N/A 03/07/2020   Procedure: AGILE CAPSULE;  Surgeon: Daneil Dolin, MD;  [facility];  Service:  Endoscopy;  Laterality: N/A;  7:30am  . BACK SURGERY     fusion  . BRONCHIAL NEEDLE ASPIRATION BIOPSY N/A 05/20/2020   Procedure: BRONCHIAL NEEDLE ASPIRATION BIOPSIES;  Surgeon: Collene Gobble, MD;  Location: [facility] ENDOSCOPY;  Service: Pulmonary;  Laterality: N/A;  . CHOLECYSTECTOMY    . COLONOSCOPY  01/2009   FTD:DUKGUR rectum/repeat in 5 yrs  . COLONOSCOPY N/A 04/29/2014   Dr.Rourk- attempted/incomplete colonoscopy. inadequate prep  . COLONOSCOPY N/A 05/27/2014   [facility]:[id number] coli. Colonic polyps-removed as described above.Status post segmental biopsy. single tubular adenoma and random colon bx neg  . COLONOSCOPY WITH PROPOFOL N/A 12/23/2019   Procedure: COLONOSCOPY WITH PROPOFOL;  Surgeon: Ronnette Juniper, MD;  Location: [place];  Service: Gastroenterology;  Laterality: N/A;  . DILATION AND CURETTAGE OF UTERUS  1974  . ESOPHAGOGASTRODUODENOSCOPY  05/2010   Dr. Tessie Fass, erosion. 66F dilation  . ESOPHAGOGASTRODUODENOSCOPY N/A 04/29/2014   Dr.Rourk- normal esophagus s/p passage of maloney dilator. small hiatal hernia- bx= chronic inflammation.  . ESOPHAGOGASTRODUODENOSCOPY (EGD) WITH PROPOFOL N/A 12/23/2019   Procedure: ESOPHAGOGASTRODUODENOSCOPY (EGD) WITH PROPOFOL;  Surgeon: Ronnette Juniper, MD;  Location: [facility];  Service: Gastroenterology;  Laterality: N/A;  . GIVENS CAPSULE STUDY N/A 03/22/2020   Procedure: GIVENS CAPSULE STUDY;  Surgeon: Daneil Dolin, MD;  [facility];  Service: Endoscopy;  Laterality: N/A;  7:30am  . HEMOSTASIS CLIP PLACEMENT  12/23/2019   Procedure: HEMOSTASIS CLIP PLACEMENT;  Surgeon: Ronnette Juniper, MD;  Location: [place];  Service: Gastroenterology;;  . HOT HEMOSTASIS N/A 12/23/2019   Procedure: HOT HEMOSTASIS (ARGON PLASMA COAGULATION/BICAP);  Surgeon: Ronnette Juniper, MD;  Location: [place];  Service: Gastroenterology;  Laterality: N/A;  . LEFT HEART CATH AND CORONARY ANGIOGRAPHY N/A 12/24/2019   Procedure: LEFT HEART CATH AND CORONARY ANGIOGRAPHY;  Surgeon: Nigel Mormon, MD;  Location: [facility];  Service: Cardiovascular;  Laterality: N/A;  . Venia Minks DILATION N/A 04/29/2014   Procedure: Venia Minks DILATION;  Surgeon: Daneil Dolin, MD;  [facility];  Service: Endoscopy;  Laterality: N/A;  . PARTIAL HYSTERECTOMY  1978  . POLYPECTOMY  12/23/2019   Procedure: POLYPECTOMY;  Surgeon: Ronnette Juniper, MD;  Location: [facility] ENDOSCOPY;  Service: Gastroenterology;;  . SHOULDER SURGERY Left    Left shoulder for RTC;left arm surgery '98/left hand surgery 2001  . TOTAL ABDOMINAL HYSTERECTOMY W/ BILATERAL SALPINGOOPHORECTOMY  2002  . UMBILICAL HERNIA REPAIR  2008  . VIDEO BRONCHOSCOPY WITH ENDOBRONCHIAL ULTRASOUND N/A 05/20/2020   Procedure: VIDEO BRONCHOSCOPY WITH ENDOBRONCHIAL ULTRASOUND;  Surgeon: Collene Gobble, MD;  Location: [facility] ENDOSCOPY;  Service: Pulmonary;  Laterality: N/A;    OB History    Gravida  3   Para  2   Term  2   Preterm      AB  1   Living  2     SAB  1   IAB      Ectopic      Multiple      Live Births              Allergies  Allergen Reactions  . Iohexol      Desc: CHEST TIGHTNESS,BRETHING PROBLEMS NEEDS PRE MEDS 13 hour premeds given 06/08/2020 without incident   . Tape  Other (See Comments)    Tears skin  . Ciprofloxacin Rash  . Latex Rash    GLOVES   . Penicillins Swelling    Did it involve swelling of the face/tongue/throat, SOB, or low BP? Yes Did it involve  sudden or severe rash/hives, skin peeling, or any reaction on the inside of your mouth or nose? No Did you need to seek medical attention at a hospital or doctor's office? Yes When did it last happen?1996 If all above answers are "NO", may proceed with cephalosporin use.  . Povidone-Iodine Rash    Social History   Socioeconomic History  . Marital status: Widowed    Spouse name: Not on file  . Number of children: 2  . Years of education: Not on file  . Highest education level: Not on file  Occupational History  . 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 Smoking status: Former Smoker    Packs/day: 0.50    Years: 40.00    Pack years: 20.00    Types: Cigarettes    Quit date: 08/30/2017    Years since quitting: 3.1  . Smokeless tobacco: Never Used  Vaping Use  . Vaping Use: Never used  Substance and Sexual Activity  . Alcohol use: No  . Drug use: No  . Sexual activity: Yes    Birth control/protection: Surgical  Other Topics Concern  . Not on file  Social History Narrative   Married x3   Lives with husband   GED in 2005   Social Determinants of Health   Financial Resource Strain: Low Risk   . Difficulty of Paying Living Expenses: Not hard at all  Food Insecurity: No Food Insecurity  . Worried About Charity fundraiser in the Last Year: Never true  . Ran Out of Food in the Last Year: Never true  Transportation Needs: No Transportation Needs  . Lack of Transportation (Medical): No  . Lack of Transportation (Non-Medical): No  Physical Activity: Inactive  . Days of Exercise per Week: 0 days  . Minutes of Exercise per Session: 0 min  Stress: No Stress Concern Present  . Feeling of Stress : Not at  all  Social Connections: Unknown  . Frequency of Communication with Friends and Family: Three times a week  . Frequency of Social Gatherings with Friends and Family: Once a week  . Attends Religious Services: Patient refused  . Active Member of Clubs or Organizations: No  . Attends Archivist Meetings: Never  . Marital Status: Widowed    Family History  Problem Relation Age of Onset  . Depression Mother   . Bipolar disorder Mother   . Dementia Mother   . Pulmonary fibrosis Mother   . Alcohol abuse Father   . Aneurysm Father        deceased age 109, brain  . Colon cancer Paternal Grandfather        age greater than 75    Medications:       Current Outpatient Medications:  .  albuterol (PROVENTIL HFA;VENTOLIN HFA) 108 (90 BASE) MCG/ACT inhaler, Inhale 2 puffs into the lungs every 4 (four) hours as needed for wheezing or shortness of breath., Disp: 1 Inhaler, Rfl: 0 .  aspirin-acetaminophen-caffeine (EXCEDRIN MIGRAINE) 250-250-65 MG tablet, Take 2 tablets by mouth daily as needed for headache. Up to 4 times a week, Disp: , Rfl:  .  b complex vitamins tablet, Take 1 tablet by mouth daily. With Zinc and vitamin C, Disp: , Rfl:  .  Benzocaine (BOIL-EASE EX), Apply 1 application topically daily as needed (Boil)., Disp: , Rfl:  .  Biotin 5000 MCG TABS, Take 10,000 mcg by mouth daily. Keratin,  Disp: , Rfl:  .  carbamide peroxide (DEBROX) 6.5 % OTIC solution, 5 drops daily as needed., Disp: , Rfl:  .  Cholecalciferol (VITAMIN D3) 50 MCG (2000 UT) TABS, Take 4,000 Units by mouth daily., Disp: , Rfl:  .  Cyanocobalamin 2500 MCG CHEW, Chew 2,500 mcg by mouth daily., Disp: , Rfl:  .  diazepam (VALIUM) 5 MG tablet, Take 5 mg by mouth daily as needed., Disp: , Rfl:  .  ferrous sulfate 325 (65 FE) MG tablet, Take 325 mg by mouth daily with breakfast., Disp: , Rfl:  .  Fluticasone-Umeclidin-Vilant (TRELEGY ELLIPTA) 100-62.5-25 MCG/INH AEPB, Inhale 1 puff into the lungs daily., Disp: 28  each, Rfl: 5 .  furosemide (LASIX) 40 MG tablet, Take 1 tablet (40 mg total) by mouth 2 (two) times daily. (Patient taking differently: Take 40 mg by mouth daily.), Disp: 30 tablet, Rfl: 1 .  Ginkgo Biloba Extract 60 MG CAPS, Take 2 capsules by mouth daily. , Disp: , Rfl:  .  hydrocortisone cream 1 %, Apply 1 application topically daily as needed for itching., Disp: , Rfl:  .  Hydrocortisone, Perianal, (PROCTO-PAK) 1 % CREA, Apply anorectally twice daily for two weeks., Disp: 28 g, Rfl: 0 .  LANTUS SOLOSTAR 100 UNIT/ML Solostar Pen, Inject 16 Units into the skin daily. , Disp: , Rfl:  .  levothyroxine (SYNTHROID) 100 MCG tablet, Take 112 mcg by mouth daily before breakfast. , Disp: , Rfl:  .  metFORMIN (GLUCOPHAGE) 500 MG tablet, Take 500 mg by mouth 2 (two) times daily with a meal. , Disp: , Rfl:  .  Multiple Vitamins-Minerals (MULTIVITAMIN WITH MINERALS) tablet, Take 1 tablet by mouth daily. Adult 50+, Disp: , Rfl:  .  neomycin-bacitracin-polymyxin (NEOSPORIN) OINT, Apply 1 application topically daily as needed for irritation or wound care., Disp: , Rfl:  .  Nitroglycerin 0.4 % OINT, Place one inch anorectally with a gloved finger every 12 hours for up to 3 weeks for anal fissure. Apply with a gloved finger to prevent absorption of nitroglycerin through the skin which can lead to headache., Disp: 30 g, Rfl: 0 .  Omega 3-6-9 Fatty Acids (OMEGA 3-6-9 COMPLEX PO), Take 2 tablets by mouth daily., Disp: , Rfl:  .  oxyCODONE-acetaminophen (PERCOCET/ROXICET) 5-325 MG tablet, Take 0.5 tablets by mouth 2 (two) times daily as needed for severe pain., Disp: , Rfl:  .  pantoprazole (PROTONIX) 40 MG tablet, Take 40 mg by mouth daily., Disp: , Rfl:  .  PARoxetine (PAXIL) 20 MG tablet, Take 20 mg by mouth daily. , Disp: , Rfl:  .  pravastatin (PRAVACHOL) 40 MG tablet, Take 40 mg by mouth daily., Disp: , Rfl:  .  pyridOXINE (VITAMIN B-6) 50 MG tablet, Take 200 mg by mouth daily., Disp: , Rfl:  .  silver  sulfADIAZINE (SILVADENE) 1 % cream, Apply 2-3 times per day, Disp: 50 g, Rfl: 11 .  sulfamethoxazole-trimethoprim (BACTRIM DS) 800-160 MG tablet, Take 1 tablet by mouth 2 (two) times daily., Disp: 14 tablet, Rfl: 0 .  traZODone (DESYREL) 150 MG tablet, Take 150 mg by mouth at bedtime. , Disp: , Rfl:  .  diphenhydrAMINE (BENADRYL) 50 MG capsule, Take 68m 1 hour before scheduled CT (Patient not taking: Reported on 10/17/2020), Disp: 1 capsule, Rfl: 0 .  ibuprofen (ADVIL) 200 MG tablet, Take 200-600 mg by mouth every 6 (six) hours as needed for moderate pain. (Patient not taking: Reported on 10/17/2020), Disp: , Rfl:  .  LORazepam (ATIVAN) 0.5 MG  tablet, Take 1 tablet 30 minutes before the MRI.  Repeat once if needed. (Patient not taking: Reported on 10/17/2020), Disp: 2 tablet, Rfl: 0 .  predniSONE (DELTASONE) 50 MG tablet, Take 50 mg 13 hours, 7 hours, and 2 hours before scheduled CT scan (Patient not taking: Reported on 10/17/2020), Disp: 3 tablet, Rfl: 0 .  prochlorperazine (COMPAZINE) 10 MG tablet, Take 1 tablet (10 mg total) by mouth every 6 (six) hours as needed. (Patient not taking: Reported on 10/17/2020), Disp: 30 tablet, Rfl: 2 .  TechLite Lancets MISC, , Disp: , Rfl:   Objective Blood pressure 138/69, pulse 92, height 5' 6.75" (1.695 m), weight 138 lb (62.6 kg).  Area in the dependent portion of her right vulva indurated erythematous relatively localized with no significant vulvar spread Remainder of exam is normal  Pertinent ROS No burning with urination, frequency or urgency No nausea, vomiting or diarrhea Nor fever chills or other constitutional symptoms   Labs or studies No new     Impression Diagnoses this Encounter::   ICD-10-CM   1. Vulvar boil, right  N76.4    Patient place on oral Bactrim DS and topical silvadene.  Re evaluate in 1 week for possible I&D vs removal vs no further therapy needed    Established relevant diagnosis(es):   Plan/Recommendations: Meds ordered  this encounter  Medications  . sulfamethoxazole-trimethoprim (BACTRIM DS) 800-160 MG tablet    Sig: Take 1 tablet by mouth 2 (two) times daily.    Dispense:  14 tablet    Refill:  0  . silver sulfADIAZINE (SILVADENE) 1 % cream    Sig: Apply 2-3 times per day    Dispense:  50 g    Refill:  11    Labs or Scans Ordered: No orders of the defined types were placed in this encounter.   Management:: See I/P above  Follow up Return in about 1 week (around 10/24/2020) for Follow up, with Dr Elonda Husky.     All questions were answered.

## 2020-10-24 ENCOUNTER — Other Ambulatory Visit: Payer: Self-pay

## 2020-10-24 MED ORDER — TRELEGY ELLIPTA 100-62.5-25 MCG/INH IN AEPB: 1.0000 | INHALATION_SPRAY | Freq: Every day | RESPIRATORY_TRACT | 0 refills | Status: DC

## 2020-10-25 ENCOUNTER — Other Ambulatory Visit: Payer: Self-pay

## 2020-10-25 ENCOUNTER — Encounter: Payer: Medicare HMO | Admitting: Obstetrics & Gynecology

## 2020-10-25 ENCOUNTER — Encounter: Payer: Self-pay | Admitting: Obstetrics & Gynecology

## 2020-10-25 ENCOUNTER — Ambulatory Visit (INDEPENDENT_AMBULATORY_CARE_PROVIDER_SITE_OTHER): Payer: Medicare HMO | Admitting: Obstetrics & Gynecology

## 2020-10-25 VITALS — BP 92/57 | HR 102 | Ht 66.75 in | Wt 138.0 lb

## 2020-10-25 DIAGNOSIS — N764 Abscess of vulva: Secondary | ICD-10-CM

## 2020-10-25 NOTE — Progress Notes (Signed)
Incision and Drainage Procedure Note  Diagnosis: Right vulvar boil, s/p 1 week antibiotics   Location: right vulva   Informed Consent: Discussed risks (permanent scarring, light or dark discoloration, infection, pain, bleeding, bruising, redness, damage to adjacent structures, and recurrence of the lesion) and benefits of the procedure, as well as the alternatives.  Informed consent was obtained.  Preparation: The area was prepared and draped in a standard fashion.  Anesthesia: Lidocaine 1% without epinephrine   Procedure Details: An incision was made overlying the lesion. The lesion drained pus and blood.  A small amount of fluid was drained.   Due to its large size, the cavity was packed with iodiform gauze.   The patient was instructed to return in 2-3 days for packing removal.    Antibiotic ointment and a sterile pressure dressing were applied. The patient tolerated procedure well. Total number of lesions drained: 1 Plan: The patient was instructed on post-op care. Recommend OTC analgesia as needed for pain.

## 2020-10-26 DIAGNOSIS — E785 Hyperlipidemia, unspecified: Secondary | ICD-10-CM | POA: Diagnosis not present

## 2020-10-26 DIAGNOSIS — K7469 Other cirrhosis of liver: Secondary | ICD-10-CM | POA: Diagnosis not present

## 2020-10-26 DIAGNOSIS — K922 Gastrointestinal hemorrhage, unspecified: Secondary | ICD-10-CM | POA: Diagnosis not present

## 2020-10-26 DIAGNOSIS — I10 Essential (primary) hypertension: Secondary | ICD-10-CM | POA: Diagnosis not present

## 2020-10-26 DIAGNOSIS — E039 Hypothyroidism, unspecified: Secondary | ICD-10-CM | POA: Diagnosis not present

## 2020-10-26 DIAGNOSIS — K219 Gastro-esophageal reflux disease without esophagitis: Secondary | ICD-10-CM | POA: Diagnosis not present

## 2020-10-26 DIAGNOSIS — D519 Vitamin B12 deficiency anemia, unspecified: Secondary | ICD-10-CM | POA: Diagnosis not present

## 2020-10-26 DIAGNOSIS — E1165 Type 2 diabetes mellitus with hyperglycemia: Secondary | ICD-10-CM | POA: Diagnosis not present

## 2020-10-26 DIAGNOSIS — F419 Anxiety disorder, unspecified: Secondary | ICD-10-CM | POA: Diagnosis not present

## 2020-10-26 DIAGNOSIS — D509 Iron deficiency anemia, unspecified: Secondary | ICD-10-CM | POA: Diagnosis not present

## 2020-10-26 DIAGNOSIS — N1832 Chronic kidney disease, stage 3b: Secondary | ICD-10-CM | POA: Diagnosis not present

## 2020-10-27 ENCOUNTER — Encounter: Payer: Self-pay | Admitting: Obstetrics & Gynecology

## 2020-10-27 ENCOUNTER — Ambulatory Visit (INDEPENDENT_AMBULATORY_CARE_PROVIDER_SITE_OTHER): Payer: Medicare HMO | Admitting: Obstetrics & Gynecology

## 2020-10-27 ENCOUNTER — Other Ambulatory Visit: Payer: Self-pay

## 2020-10-27 VITALS — BP 110/66 | HR 91 | Ht 66.4 in | Wt 140.0 lb

## 2020-10-27 DIAGNOSIS — N764 Abscess of vulva: Secondary | ICD-10-CM

## 2020-10-27 MED ORDER — SILVER SULFADIAZINE 1 % EX CREA: TOPICAL_CREAM | CUTANEOUS | 11 refills | Status: DC

## 2020-10-27 NOTE — Progress Notes (Signed)
Follow up appointment      Chief Complaint  Patient presents with  . Follow-up    Right vulvar boil / area still burning and itching    Blood pressure 110/66, pulse 91, height 5' 6.4" (1.687 m), weight 140 lb (63.5 kg).  S/P I&D with packing Packing removed, stay suture is removed  H2O2 used Gentian violet used  MEDS ordered this encounter: Meds ordered this encounter  Medications  . silver sulfADIAZINE (SILVADENE) 1 % cream    Sig: Use to area TID    Dispense:  50 g    Refill:  11    Orders for this encounter: No orders of the defined types were placed in this encounter.   Impression:   ICD-10-CM   1. Vulvar boil, right  N76.4       Plan: Local care Follow up 11 days for evaluation  Follow Up: Return in about 11 days (around 11/07/2020) for Follow up, with Dr Elonda Husky.       All questions were answered.  Past Medical History:  Diagnosis Date  . Allergic rhinitis   . 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 Anemia   . Anxiety   . ASCVD (arteriosclerotic cardiovascular disease)    MI in 96 requiring BMS CX; DES to M1 in 2000;normal coronary angiography in 2004  . Cholelithiasis   . COPD (chronic obstructive pulmonary disease) ([place])   . DDD (degenerative disc disease), lumbar   . Depression   . Diabetes mellitus    Type II  . Diarrhea   . DVT (deep venous thrombosis) ([place])    patient said no  . GERD (gastroesophageal reflux disease)   . Headache    migraine  . History of kidney stones    2006  . Hyperlipidemia   . Hypotension   . Hypothyroidism   . Low back pain   . Myocardial infarction ([place]) 01/1995  . Nephrolithiasis 2006   stone extraction   . Panic attacks   . Peripheral neuropathy   . Peripheral vascular disease (HCC)    legs  . Pneumonia 12/22/2019  . Sciatic pain    right  . Small cell lung cancer ([place])   . Tobacco abuse   . Tremor     Past Surgical History:  Procedure Laterality Date  . AGILE CAPSULE N/A  03/07/2020   Procedure: AGILE CAPSULE;  Surgeon: Daneil Dolin, MD;  [facility];  Service: Endoscopy;  Laterality: N/A;  7:30am  . BACK SURGERY     fusion  . BRONCHIAL NEEDLE ASPIRATION BIOPSY N/A 05/20/2020   Procedure: BRONCHIAL NEEDLE ASPIRATION BIOPSIES;  Surgeon: Collene Gobble, MD;  Location: [facility] ENDOSCOPY;  Service: Pulmonary;  Laterality: N/A;  . CHOLECYSTECTOMY    . COLONOSCOPY  01/2009   KCM:[id number] rectum/repeat in 5 yrs  . COLONOSCOPY N/A 04/29/2014   Dr.Rourk- attempted/incomplete colonoscopy. inadequate prep  . COLONOSCOPY N/A 05/27/2014   HXT:[id number] coli. Colonic polyps-removed as described above.Status post segmental biopsy. single tubular adenoma and random colon bx neg  . COLONOSCOPY WITH PROPOFOL N/A 12/23/2019   Procedure: COLONOSCOPY WITH PROPOFOL;  Surgeon: Ronnette Juniper, MD;  Location: [place];  Service: Gastroenterology;  Laterality: N/A;  . DILATION AND CURETTAGE OF UTERUS  1974  . ESOPHAGOGASTRODUODENOSCOPY  05/2010   Dr. Tessie Fass, erosion. 23F dilation  . ESOPHAGOGASTRODUODENOSCOPY N/A 04/29/2014   Dr.Rourk- normal esophagus s/p passage of maloney dilator. small hiatal hernia- bx= chronic inflammation.  . ESOPHAGOGASTRODUODENOSCOPY (  EGD) WITH PROPOFOL N/A 12/23/2019   Procedure: ESOPHAGOGASTRODUODENOSCOPY (EGD) WITH PROPOFOL;  Surgeon: Ronnette Juniper, MD;  Location: [place];  Service: Gastroenterology;  Laterality: N/A;  . GIVENS CAPSULE STUDY N/A 03/22/2020   Procedure: GIVENS CAPSULE STUDY;  Surgeon: Daneil Dolin, MD;  [facility];  Service: Endoscopy;  Laterality: N/A;  7:30am  . HEMOSTASIS CLIP PLACEMENT  12/23/2019   Procedure: HEMOSTASIS CLIP PLACEMENT;  Surgeon: Ronnette Juniper, MD;  Location: [place];  Service: Gastroenterology;;  . HOT HEMOSTASIS N/A 12/23/2019   Procedure: HOT HEMOSTASIS (ARGON PLASMA COAGULATION/BICAP);  Surgeon: Ronnette Juniper, MD;  Location: [place];  Service: Gastroenterology;  Laterality: N/A;  . LEFT  HEART CATH AND CORONARY ANGIOGRAPHY N/A 12/24/2019   Procedure: LEFT HEART CATH AND CORONARY ANGIOGRAPHY;  Surgeon: Nigel Mormon, MD;  Location: [place] CV LAB;  Service: Cardiovascular;  Laterality: N/A;  . Venia Minks DILATION N/A 04/29/2014   Procedure: Venia Minks DILATION;  Surgeon: Daneil Dolin, MD;  [facility];  Service: Endoscopy;  Laterality: N/A;  . PARTIAL HYSTERECTOMY  1978  . POLYPECTOMY  12/23/2019   Procedure: POLYPECTOMY;  Surgeon: Ronnette Juniper, MD;  Location: [facility] ENDOSCOPY;  Service: Gastroenterology;;  . SHOULDER SURGERY Left    Left shoulder for RTC;left arm surgery '98/left hand surgery 2001  . TOTAL ABDOMINAL HYSTERECTOMY W/ BILATERAL SALPINGOOPHORECTOMY  2002  . UMBILICAL HERNIA REPAIR  2008  . VIDEO BRONCHOSCOPY WITH ENDOBRONCHIAL ULTRASOUND N/A 05/20/2020   Procedure: VIDEO BRONCHOSCOPY WITH ENDOBRONCHIAL ULTRASOUND;  Surgeon: Collene Gobble, MD;  Location[facility] END[facility]COPY;  Service: Pulmonary;  Laterality: N/A;    OB History    Gravida  3   Para  2   Term  2   Preterm      AB  1   Living  2     SAB  1   IAB      Ectopic      Multiple      Live Births              Allergies  Allergen Reactions  . Iohexol      Desc: CHEST TIGHTNESS,BRETHING PROBLEMS NEEDS PRE MEDS 13 hour premeds given 06/08/2020 without incident   . Tape Other (See Comments)    Tears skin  . Ciprofloxacin Rash  . Latex Rash    GLOVES   . Penicillins Swelling    Did it involve swelling of the face/tongue/throat, SOB, or low BP? Yes Did it involve sudden or severe rash/hives, skin peeling, or any reaction on the inside of your mouth or nose? No Did you need to seek medical attention at a hospital or doctor's office? Yes When did it last happen?1996 If all above answers are "NO", may proceed with cephalosporin use.  . Povidone-Iodine Rash    Social History   Socioeconomic History  . Marital status: Widowed    Spouse name: Not on file  . Number of  children: 2  . Years of education: Not on file  . Highest education level: Not on file  Occupational History  . 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 Smoking status: Former Smoker    Packs/day: 0.50    Years: 40.00    Pack years: 20.00    Types: Cigarettes    Quit date: 08/30/2017    Years since quitting: 3.1  . Smokeless tobacco: Never Used  Vaping Use  . Vaping Use:  Never used  Substance and Sexual Activity  . Alcohol use: No  . Drug use: No  . Sexual activity: Yes    Birth control/protection: Surgical  Other Topics Concern  . Not on file  Social History Narrative   Married x3   Lives with husband   GED in 2005   Social Determinants of Health   Financial Resource Strain: Low Risk   . Difficulty of Paying Living Expenses: Not hard at all  Food Insecurity: No Food Insecurity  . Worried About Charity fundraiser in the Last Year: Never true  . Ran Out of Food in the Last Year: Never true  Transportation Needs: No Transportation Needs  . Lack of Transportation (Medical): No  . Lack of Transportation (Non-Medical): No  Physical Activity: Inactive  . Days of Exercise per Week: 0 days  . Minutes of Exercise per Session: 0 min  Stress: No Stress Concern Present  . Feeling of Stress : Not at all  Social Connections: Unknown  . Frequency of Communication with Friends and Family: Three times a week  . Frequency of Social Gatherings with Friends and Family: Once a week  . Attends Religious Services: Patient refused  . Active Member of Clubs or Organizations: No  . Attends Archivist Meetings: Never  . Marital Status: Widowed    Family History  Problem Relation Age of Onset  . Depression Mother   . Bipolar disorder Mother   . Dementia Mother   . Pulmonary fibrosis Mother   . Alcohol abuse Father   . Aneurysm Father        deceased age 1, brain  . Colon cancer Paternal Grandfather         age greater than 83

## 2020-10-31 DIAGNOSIS — J4540 Moderate persistent asthma, uncomplicated: Secondary | ICD-10-CM | POA: Diagnosis not present

## 2020-10-31 DIAGNOSIS — D509 Iron deficiency anemia, unspecified: Secondary | ICD-10-CM | POA: Diagnosis not present

## 2020-10-31 DIAGNOSIS — E1165 Type 2 diabetes mellitus with hyperglycemia: Secondary | ICD-10-CM | POA: Diagnosis not present

## 2020-10-31 DIAGNOSIS — N1832 Chronic kidney disease, stage 3b: Secondary | ICD-10-CM | POA: Diagnosis not present

## 2020-10-31 DIAGNOSIS — I744 Embolism and thrombosis of arteries of extremities, unspecified: Secondary | ICD-10-CM | POA: Diagnosis not present

## 2020-10-31 DIAGNOSIS — I10 Essential (primary) hypertension: Secondary | ICD-10-CM | POA: Diagnosis not present

## 2020-10-31 DIAGNOSIS — R944 Abnormal results of kidney function studies: Secondary | ICD-10-CM | POA: Diagnosis not present

## 2020-10-31 DIAGNOSIS — J449 Chronic obstructive pulmonary disease, unspecified: Secondary | ICD-10-CM | POA: Diagnosis not present

## 2020-11-01 ENCOUNTER — Ambulatory Visit
Admission: EM | Admit: 2020-11-01 | Discharge: 2020-11-01 | Disposition: A | Discharge status: Home / Self Care | Payer: Medicare HMO | Attending: Family Medicine | Admitting: Family Medicine

## 2020-11-01 ENCOUNTER — Other Ambulatory Visit: Payer: Self-pay

## 2020-11-01 ENCOUNTER — Encounter: Payer: Self-pay | Admitting: Emergency Medicine

## 2020-11-01 DIAGNOSIS — L03211 Cellulitis of face: Secondary | ICD-10-CM | POA: Diagnosis not present

## 2020-11-01 DIAGNOSIS — W540XXA Bitten by dog, initial encounter: Secondary | ICD-10-CM

## 2020-11-01 MED ORDER — SULFAMETHOXAZOLE-TRIMETHOPRIM 800-160 MG PO TABS: 1.0000 | ORAL_TABLET | Freq: Two times a day (BID) | ORAL | 0 refills | Status: AC

## 2020-11-01 MED ORDER — METRONIDAZOLE 500 MG PO TABS: 500.0000 mg | ORAL_TABLET | Freq: Three times a day (TID) | ORAL | 0 refills | Status: AC

## 2020-11-01 NOTE — ED Triage Notes (Signed)
Pt's dog bit her on the face Saturday. Pt feels like she needs abx. Area is swollen and painful.  Dog has had rabies shots.

## 2020-11-01 NOTE — Discharge Instructions (Signed)
I have sent in Bactrim for you to take twice a day for 7 days  I have sent in metronidazole for you to take three times per day for 7 days.  Follow up with this office or with primary care if symptoms are persisting.  Follow up in the ER for high fever, trouble swallowing, trouble breathing, other concerning symptoms.

## 2020-11-01 NOTE — ED Provider Notes (Signed)
[place]   [id number] 11/01/20 Arrival Time: 35  CC: RASH  SUBJECTIVE:  [name] is a 70 y.o. female who presents with a skin complaint that began 3 days ago. Reports that she was bitten in the face on the L cheek by her dog. Reports that the now the cheek is sore and swollen. States that the dog is up to date on Rabies vaccines. Has not attempted OTC treatments. There are no aggravating or alleviating factors. Denies similar symptoms in the past.  Denies fever, chills, nausea, vomiting, discharge, oral lesions, SOB, chest pain, abdominal pain, changes in bowel or bladder function.    ROS: As per HPI.  All other pertinent ROS negative.     Past Medical History:  Diagnosis Date  . Allergic rhinitis   . 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 Anemia   . Anxiety   . ASCVD (arteriosclerotic cardiovascular disease)    MI in 96 requiring BMS CX; DES to M1 in 2000;normal coronary angiography in 2004  . Cholelithiasis   . COPD (chronic obstructive pulmonary disease) ([place])   . DDD (degenerative disc disease), lumbar   . Depression   . Diabetes mellitus    Type II  . Diarrhea   . DVT (deep venous thrombosis) ([place])    patient said no  . GERD (gastroesophageal reflux disease)   . Headache    migraine  . History of kidney stones    2006  . Hyperlipidemia   . Hypotension   . Hypothyroidism   . Low back pain   . Myocardial infarction ([place]) 01/1995  . Nephrolithiasis 2006   stone extraction   . Panic attacks   . Peripheral neuropathy   . Peripheral vascular disease (HCC)    legs  . Pneumonia 12/22/2019  . Sciatic pain    right  . Small cell lung cancer ([place])   . Tobacco abuse   . Tremor    Past Surgical History:  Procedure Laterality Date  . AGILE CAPSULE N/A 03/07/2020   Procedure: AGILE CAPSULE;  Surgeon: Daneil Dolin, MD;  [facility];  Service: Endoscopy;  Laterality: N/A;  7:30am  . BACK SURGERY     fusion  . BRONCHIAL  NEEDLE ASPIRATION BIOPSY N/A 05/20/2020   Procedure: BRONCHIAL NEEDLE ASPIRATION BIOPSIES;  Surgeon: Collene Gobble, MD;  Location: [facility] ENDOSCOPY;  Service: Pulmonary;  Laterality: N/A;  . CHOLECYSTECTOMY    . COLONOSCOPY  01/2009   YIF:[id number] rectum/repeat in 5 yrs  . COLONOSCOPY N/A 04/29/2014   Dr.Rourk- attempted/incomplete colonoscopy. inadequate prep  . COLONOSCOPY N/A 05/27/2014   OIN:OMVEHMCNO coli. Colonic polyps-removed as described above.Status post segmental biopsy. single tubular adenoma and random colon bx neg  . COLONOSCOPY WITH PROPOFOL N/A 12/23/2019   Procedure: COLONOSCOPY WITH PROPOFOL;  Surgeon: Ronnette Juniper, MD;  Location: [place];  Service: Gastroenterology;  Laterality: N/A;  . DILATION AND CURETTAGE OF UTERUS  1974  . ESOPHAGOGASTRODUODENOSCOPY  05/2010   Dr. Tessie Fass, erosion. 48F dilation  . ESOPHAGOGASTRODUODENOSCOPY N/A 04/29/2014   Dr.Rourk- normal esophagus s/p passage of maloney dilator. small hiatal hernia- bx= chronic inflammation.  . ESOPHAGOGASTRODUODENOSCOPY (EGD) WITH PROPOFOL N/A 12/23/2019   Procedure: ESOPHAGOGASTRODUODENOSCOPY (EGD) WITH PROPOFOL;  Surgeon: Ronnette Juniper, MD;  Location: [place];  Service: Gastroenterology;  Laterality: N/A;  . GIVENS CAPSULE STUDY N/A 03/22/2020   Procedure: GIVENS CAPSULE STUDY;  Surgeon: Daneil Dolin, MD;  [facility];  Service: Endoscopy;  Laterality:  N/A;  7:30am  . HEMOSTASIS CLIP PLACEMENT  12/23/2019   Procedure: HEMOSTASIS CLIP PLACEMENT;  Surgeon: Ronnette Juniper, MD;  Location: [place];  Service: Gastroenterology;;  . HOT HEMOSTASIS N/A 12/23/2019   Procedure: HOT HEMOSTASIS (ARGON PLASMA COAGULATION/BICAP);  Surgeon: Ronnette Juniper, MD;  Location: [place];  Service: Gastroenterology;  Laterality: N/A;  . LEFT HEART CATH AND CORONARY ANGIOGRAPHY N/A 12/24/2019   Procedure: LEFT HEART CATH AND CORONARY ANGIOGRAPHY;  Surgeon: Nigel Mormon, MD;  Location: [place] CV LAB;  Service:  Cardiovascular;  Laterality: N/A;  . Venia Minks DILATION N/A 04/29/2014   Procedure: Venia Minks DILATION;  Surgeon: Daneil Dolin, MD;  [facility];  Service: Endoscopy;  Laterality: N/A;  . PARTIAL HYSTERECTOMY  1978  . POLYPECTOMY  12/23/2019   Procedure: POLYPECTOMY;  Surgeon: Ronnette Juniper, MD;  Location: [facility] ENDOSCOPY;  Service: Gastroenterology;;  . SHOULDER SURGERY Left    Left shoulder for RTC;left arm surgery '98/left hand surgery 2001  . TOTAL ABDOMINAL HYSTERECTOMY W/ BILATERAL SALPINGOOPHORECTOMY  2002  . UMBILICAL HERNIA REPAIR  2008  . VIDEO BRONCHOSCOPY WITH ENDOBRONCHIAL ULTRASOUND N/A 05/20/2020   Procedure: VIDEO BRONCHOSCOPY WITH ENDOBRONCHIAL ULTRASOUND;  Surgeon: Collene Gobble, MD;  Location: [facility] ENDOSCOPY;  Service: Pulmonary;  Laterality: N/A;   Allergies  Allergen Reactions  . Iohexol      Desc: CHEST TIGHTNESS,BRETHING PROBLEMS NEEDS PRE MEDS 13 hour premeds given 06/08/2020 without incident   . Tape Other (See Comments)    Tears skin  . Ciprofloxacin Rash  . Latex Rash    GLOVES   . Penicillins Swelling    Did it involve swelling of the face/tongue/throat, SOB, or low BP? Yes Did it involve sudden or severe rash/hives, skin peeling, or any reaction on the inside of your mouth or nose? No Did you need to seek medical attention at a hospital or doctor's office? Yes When did it last happen?1996 If all above answers are "NO", may proceed with cephalosporin use.  . Povidone-Iodine Rash   No current facility-administered medications on file prior to encounter.   Current Outpatient Medications on File Prior to Encounter  Medication Sig Dispense Refill  . albuterol (PROVENTIL HFA;VENTOLIN HFA) 108 (90 BASE) MCG/ACT inhaler Inhale 2 puffs into the lungs every 4 (four) hours as needed for wheezing or shortness of breath. 1 Inhaler 0  . aspirin-acetaminophen-caffeine (EXCEDRIN MIGRAINE) 250-250-65 MG tablet Take 2 tablets by mouth daily as needed for  headache. Up to 4 times a week (Patient not taking: Reported on 10/27/2020)    . b complex vitamins tablet Take 1 tablet by mouth daily. With Zinc and vitamin C    . Benzocaine (BOIL-EASE EX) Apply 1 application topically daily as needed (Boil).    . Biotin 5000 MCG TABS Take 10,000 mcg by mouth daily. Keratin    . carbamide peroxide (DEBROX) 6.5 % OTIC solution 5 drops daily as needed.    . Cholecalciferol (VITAMIN D3) 50 MCG (2000 UT) TABS Take 4,000 Units by mouth daily.    . Cyanocobalamin 2500 MCG CHEW Chew 2,500 mcg by mouth daily.    . diazepam (VALIUM) 5 MG tablet Take 5 mg by mouth daily as needed.    . diphenhydrAMINE (BENADRYL) 50 MG capsule Take 25m 1 hour before scheduled CT 1 capsule 0  . ferrous sulfate 325 (65 FE) MG tablet Take 325 mg by mouth daily with breakfast. (Patient not taking: Reported on 10/27/2020)    . Fluticasone-Umeclidin-Vilant (TRELEGY ELLIPTA) 100-62.5-25 MCG/INH AEPB  Inhale 1 puff into the lungs daily. 28 each 5  . Fluticasone-Umeclidin-Vilant (TRELEGY ELLIPTA) 100-62.5-25 MCG/INH AEPB Inhale 1 puff into the lungs daily. 60 each 0  . furosemide (LASIX) 40 MG tablet Take 1 tablet (40 mg total) by mouth 2 (two) times daily. (Patient taking differently: Take 40 mg by mouth daily.) 30 tablet 1  . Ginkgo Biloba Extract 60 MG CAPS Take 2 capsules by mouth daily.     . hydrocortisone cream 1 % Apply 1 application topically daily as needed for itching.    Marland Kitchen Hydrocortisone, Perianal, (PROCTO-PAK) 1 % CREA Apply anorectally twice daily for two weeks. (Patient not taking: Reported on 10/27/2020) 28 g 0  . ibuprofen (ADVIL) 200 MG tablet Take 200-600 mg by mouth every 6 (six) hours as needed for moderate pain. (Patient not taking: Reported on 10/27/2020)    . LANTUS SOLOSTAR 100 UNIT/ML Solostar Pen Inject 16 Units into the skin daily.  (Patient not taking: No sig reported)    . levothyroxine (SYNTHROID) 100 MCG tablet Take 112 mcg by mouth daily before breakfast.     .  LORazepam (ATIVAN) 0.5 MG tablet Take 1 tablet 30 minutes before the MRI.  Repeat once if needed. (Patient taking differently: Take 1 tablet 30 minutes before the MRI.  Repeat once if needed.) 2 tablet 0  . metFORMIN (GLUCOPHAGE) 500 MG tablet Take 500 mg by mouth 2 (two) times daily with a meal.     . Multiple Vitamins-Minerals (MULTIVITAMIN WITH MINERALS) tablet Take 1 tablet by mouth daily. Adult 50+    . neomycin-bacitracin-polymyxin (NEOSPORIN) OINT Apply 1 application topically daily as needed for irritation or wound care.    . Nitroglycerin 0.4 % OINT Place one inch anorectally with a gloved finger every 12 hours for up to 3 weeks for anal fissure. Apply with a gloved finger to prevent absorption of nitroglycerin through the skin which can lead to headache. (Patient not taking: Reported on 10/27/2020) 30 g 0  . Omega 3-6-9 Fatty Acids (OMEGA 3-6-9 COMPLEX PO) Take 2 tablets by mouth daily.    Marland Kitchen oxyCODONE-acetaminophen (PERCOCET/ROXICET) 5-325 MG tablet Take 0.5 tablets by mouth 2 (two) times daily as needed for severe pain.    . pantoprazole (PROTONIX) 40 MG tablet Take 40 mg by mouth daily.    Marland Kitchen PARoxetine (PAXIL) 20 MG tablet Take 20 mg by mouth daily.  (Patient not taking: Reported on 10/27/2020)    . pravastatin (PRAVACHOL) 40 MG tablet Take 40 mg by mouth daily.    . predniSONE (DELTASONE) 50 MG tablet Take 50 mg 13 hours, 7 hours, and 2 hours before scheduled CT scan (Patient not taking: No sig reported) 3 tablet 0  . prochlorperazine (COMPAZINE) 10 MG tablet Take 1 tablet (10 mg total) by mouth every 6 (six) hours as needed. 30 tablet 2  . pyridOXINE (VITAMIN B-6) 50 MG tablet Take 200 mg by mouth daily.    . silver sulfADIAZINE (SILVADENE) 1 % cream Apply 2-3 times per day 50 g 11  . silver sulfADIAZINE (SILVADENE) 1 % cream Use to area TID 50 g 11  . TechLite Lancets MISC  (Patient not taking: Reported on 10/27/2020)    . traZODone (DESYREL) 150 MG tablet Take 150 mg by mouth at bedtime.       Social History   Socioeconomic History  . Marital status: Widowed    Spouse name: Not on file  . Number of children: 2  . Years of education: Not on file  .  Highest education level: Not on file  Occupational History  . 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 Smoking status: Former Smoker    Packs/day: 0.50    Years: 40.00    Pack years: 20.00    Types: Cigarettes    Quit date: 08/30/2017    Years since quitting: 3.1  . Smokeless tobacco: Never Used  Vaping Use  . Vaping Use: Never used  Substance and Sexual Activity  . Alcohol use: No  . Drug use: No  . Sexual activity: Yes    Birth control/protection: Surgical  Other Topics Concern  . Not on file  Social History Narrative   Married x3   Lives with husband   GED in 2005   Social Determinants of Health   Financial Resource Strain: Low Risk   . Difficulty of Paying Living Expenses: Not hard at all  Food Insecurity: No Food Insecurity  . Worried About Charity fundraiser in the Last Year: Never true  . Ran Out of Food in the Last Year: Never true  Transportation Needs: No Transportation Needs  . Lack of Transportation (Medical): No  . Lack of Transportation (Non-Medical): No  Physical Activity: Inactive  . Days of Exercise per Week: 0 days  . Minutes of Exercise per Session: 0 min  Stress: No Stress Concern Present  . Feeling of Stress : Not at all  Social Connections: Unknown  . Frequency of Communication with Friends and Family: Three times a week  . Frequency of Social Gatherings with Friends and Family: Once a week  . Attends Religious Services: Patient refused  . Active Member of Clubs or Organizations: No  . Attends Archivist Meetings: Never  . Marital Status: Widowed  Intimate Partner Violence: Not At Risk  . Fear of Current or Ex-Partner: No  . Emotionally Abused: No  . Physically Abused: No  . Sexually Abused: No    Family History  Problem Relation Age of Onset  . Depression Mother   . Bipolar disorder Mother   . Dementia Mother   . Pulmonary fibrosis Mother   . Alcohol abuse Father   . Aneurysm Father        deceased age 48, brain  . Colon cancer Paternal Grandfather        age greater than 77    OBJECTIVE: Vitals:   11/01/20 1049 11/01/20 1051  BP:  119/65  Pulse:  83  Resp:  18  Temp:  98.5 F (36.9 C)  TempSrc:  Oral  SpO2:  93%  Weight: 137 lb (62.1 kg)   Height: 5' 6"  (1.676 m)     General appearance: alert; no distress Head: NCAT Lungs: clear to auscultation bilaterally Heart: regular rate and rhythm.  Radial pulse 2+ bilaterally Extremities: no edema Skin: warm and dry; bite mark to L cheek adjacent to nose, the L cheek is swollen, erythematous, warm, tender to touch, no drainage noted Psychological: alert and cooperative; normal mood and affect  ASSESSMENT & PLAN:  1. Cellulitis of face   2. Dog bite, initial encounter     Meds ordered this encounter  Medications  . sulfamethoxazole-trimethoprim (BACTRIM DS) 800-160 MG tablet    Sig: Take 1 tablet by mouth 2 (two) times daily for 7 days.    Dispense:  14 tablet    Refill:  0    Order Specific Question:   Supervising Provider  AnswerChase Picket [id number]  . metroNIDAZOLE (FLAGYL) 500 MG tablet    Sig: Take 1 tablet (500 mg total) by mouth 3 (three) times daily for 7 days.    Dispense:  21 tablet    Refill:  0    Order Specific Question:   Supervising Provider    Answer:   Chase Picket [id number]    Prescribed Bactrim and Flagyl Take as prescribed and to completion Avoid hot showers/ baths Moisturize skin daily  Follow up with PCP if symptoms persists Return or go to the ER if you have any new or worsening symptoms such as fever, chills, nausea, vomiting, redness, swelling, discharge, if symptoms do not improve with medications  Reviewed expectations re: course of current medical issues.  Questions answered. Outlined signs and symptoms indicating need for more acute intervention. Patient verbalized understanding. After Visit Summary given.   Faustino Congress, NP 11/02/20 [phone]

## 2020-11-02 DIAGNOSIS — L820 Inflamed seborrheic keratosis: Secondary | ICD-10-CM | POA: Diagnosis not present

## 2020-11-02 DIAGNOSIS — L570 Actinic keratosis: Secondary | ICD-10-CM | POA: Diagnosis not present

## 2020-11-02 DIAGNOSIS — L218 Other seborrheic dermatitis: Secondary | ICD-10-CM | POA: Diagnosis not present

## 2020-11-02 DIAGNOSIS — X32XXXA Exposure to sunlight, initial encounter: Secondary | ICD-10-CM | POA: Diagnosis not present

## 2020-11-02 DIAGNOSIS — S0185XA Open bite of other part of head, initial encounter: Secondary | ICD-10-CM | POA: Diagnosis not present

## 2020-11-10 ENCOUNTER — Ambulatory Visit (INDEPENDENT_AMBULATORY_CARE_PROVIDER_SITE_OTHER): Payer: Medicare HMO | Admitting: Obstetrics & Gynecology

## 2020-11-10 ENCOUNTER — Other Ambulatory Visit: Payer: Self-pay

## 2020-11-10 ENCOUNTER — Encounter: Payer: Self-pay | Admitting: Obstetrics & Gynecology

## 2020-11-10 VITALS — BP 120/63 | HR 80 | Ht 66.75 in | Wt 146.0 lb

## 2020-11-10 DIAGNOSIS — K922 Gastrointestinal hemorrhage, unspecified: Secondary | ICD-10-CM | POA: Diagnosis not present

## 2020-11-10 DIAGNOSIS — F419 Anxiety disorder, unspecified: Secondary | ICD-10-CM | POA: Diagnosis not present

## 2020-11-10 DIAGNOSIS — I10 Essential (primary) hypertension: Secondary | ICD-10-CM | POA: Diagnosis not present

## 2020-11-10 DIAGNOSIS — E1165 Type 2 diabetes mellitus with hyperglycemia: Secondary | ICD-10-CM | POA: Diagnosis not present

## 2020-11-10 DIAGNOSIS — E785 Hyperlipidemia, unspecified: Secondary | ICD-10-CM | POA: Diagnosis not present

## 2020-11-10 DIAGNOSIS — N764 Abscess of vulva: Secondary | ICD-10-CM | POA: Diagnosis not present

## 2020-11-10 DIAGNOSIS — K219 Gastro-esophageal reflux disease without esophagitis: Secondary | ICD-10-CM | POA: Diagnosis not present

## 2020-11-10 DIAGNOSIS — D509 Iron deficiency anemia, unspecified: Secondary | ICD-10-CM | POA: Diagnosis not present

## 2020-11-10 DIAGNOSIS — E039 Hypothyroidism, unspecified: Secondary | ICD-10-CM | POA: Diagnosis not present

## 2020-11-10 DIAGNOSIS — K7469 Other cirrhosis of liver: Secondary | ICD-10-CM | POA: Diagnosis not present

## 2020-11-10 NOTE — Progress Notes (Signed)
Chief Complaint  Patient presents with  . Follow-up    On boil      70 y.o. [id number] No LMP recorded. Patient has had a hysterectomy. The current method of family planning is .  Outpatient Encounter Medications as of 11/10/2020  Medication Sig  . albuterol (PROVENTIL HFA;VENTOLIN HFA) 108 (90 BASE) MCG/ACT inhaler Inhale 2 puffs into the lungs every 4 (four) hours as needed for wheezing or shortness of breath.  Marland Kitchen aspirin-acetaminophen-caffeine (EXCEDRIN MIGRAINE) 250-250-65 MG tablet Take 2 tablets by mouth daily as needed for headache. Up to 4 times a week  . b complex vitamins tablet Take 1 tablet by mouth daily. With Zinc and vitamin C  . Benzocaine (BOIL-EASE EX) Apply 1 application topically daily as needed (Boil).  . Biotin 5000 MCG TABS Take 10,000 mcg by mouth daily. Keratin  . carbamide peroxide (DEBROX) 6.5 % OTIC solution 5 drops daily as needed.  . Cholecalciferol (VITAMIN D3) 50 MCG (2000 UT) TABS Take 4,000 Units by mouth daily.  . Cyanocobalamin (VITAMIN B-12 PO) Take by mouth. Vit B6 also  . Cyanocobalamin 2500 MCG CHEW Chew 2,500 mcg by mouth daily.  . diazepam (VALIUM) 5 MG tablet Take 5 mg by mouth daily as needed.  . diphenhydrAMINE (BENADRYL) 50 MG capsule Take 28m 1 hour before scheduled CT  . ferrous sulfate 325 (65 FE) MG tablet Take 325 mg by mouth daily with breakfast.  . Fluticasone-Umeclidin-Vilant (TRELEGY ELLIPTA) 100-62.5-25 MCG/INH AEPB Inhale 1 puff into the lungs daily.  . Fluticasone-Umeclidin-Vilant (TRELEGY ELLIPTA) 100-62.5-25 MCG/INH AEPB Inhale 1 puff into the lungs daily.  . furosemide (LASIX) 40 MG tablet Take 1 tablet (40 mg total) by mouth 2 (two) times daily. (Patient taking differently: Take 40 mg by mouth every other day.)  . Ginkgo Biloba Extract 60 MG CAPS Take 2 capsules by mouth daily.   . hydrocortisone cream 1 % Apply 1 application topically daily as needed for itching.  .Marland KitchenHydrocortisone, Perianal, (PROCTO-PAK) 1 % CREA Apply  anorectally twice daily for two weeks.  .Marland Kitchenlevothyroxine (SYNTHROID) 100 MCG tablet Take 112 mcg by mouth daily before breakfast.   . LORazepam (ATIVAN) 0.5 MG tablet Take 1 tablet 30 minutes before the MRI.  Repeat once if needed. (Patient taking differently: Take 1 tablet 30 minutes before the MRI.  Repeat once if needed.)  . metFORMIN (GLUCOPHAGE) 500 MG tablet Take 1,000 mg by mouth 2 (two) times daily with a meal.  . Multiple Vitamins-Minerals (MULTIVITAMIN WITH MINERALS) tablet Take 1 tablet by mouth daily. Adult 50+  . neomycin-bacitracin-polymyxin (NEOSPORIN) OINT Apply 1 application topically daily as needed for irritation or wound care.  . Omega 3-6-9 Fatty Acids (OMEGA 3-6-9 COMPLEX PO) Take 2 tablets by mouth daily.  .Marland KitchenoxyCODONE-acetaminophen (PERCOCET/ROXICET) 5-325 MG tablet Take 0.5 tablets by mouth 2 (two) times daily as needed for severe pain.  . pantoprazole (PROTONIX) 40 MG tablet Take 40 mg by mouth daily.  . pravastatin (PRAVACHOL) 40 MG tablet Take 40 mg by mouth daily.  . predniSONE (DELTASONE) 50 MG tablet Take 50 mg 13 hours, 7 hours, and 2 hours before scheduled CT scan  . pyridOXINE (VITAMIN B-6) 50 MG tablet Take 200 mg by mouth daily.  . silver sulfADIAZINE (SILVADENE) 1 % cream Apply 2-3 times per day  . silver sulfADIAZINE (SILVADENE) 1 % cream Use to area TID  . TechLite Lancets MISC   . traZODone (DESYREL) 150 MG tablet Take 150 mg by mouth at  bedtime.   . [DISCONTINUED] ibuprofen (ADVIL) 200 MG tablet Take 200-600 mg by mouth every 6 (six) hours as needed for moderate pain. (Patient not taking: Reported on 10/27/2020)  . [DISCONTINUED] LANTUS SOLOSTAR 100 UNIT/ML Solostar Pen Inject 16 Units into the skin daily.  (Patient not taking: No sig reported)  . [DISCONTINUED] Nitroglycerin 0.4 % OINT Place one inch anorectally with a gloved finger every 12 hours for up to 3 weeks for anal fissure. Apply with a gloved finger to prevent absorption of nitroglycerin through the  skin which can lead to headache. (Patient not taking: Reported on 10/27/2020)  . [DISCONTINUED] PARoxetine (PAXIL) 20 MG tablet Take 20 mg by mouth daily.  (Patient not taking: Reported on 10/27/2020)  . [DISCONTINUED] prochlorperazine (COMPAZINE) 10 MG tablet Take 1 tablet (10 mg total) by mouth every 6 (six) hours as needed.   No facility-administered encounter medications on file as of 11/10/2020.    Subjective Follow up for her right vulvar boil Improving according to patient Past Medical History:  Diagnosis Date  . Allergic rhinitis   . 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 Anemia   . Anxiety   . ASCVD (arteriosclerotic cardiovascular disease)    MI in 96 requiring BMS CX; DES to M1 in 2000;normal coronary angiography in 2004  . Cholelithiasis   . COPD (chronic obstructive pulmonary disease) ([place])   . DDD (degenerative disc disease), lumbar   . Depression   . Diabetes mellitus    Type II  . Diarrhea   . DVT (deep venous thrombosis) ([place])    patient said no  . GERD (gastroesophageal reflux disease)   . Headache    migraine  . History of kidney stones    2006  . Hyperlipidemia   . Hypotension   . Hypothyroidism   . Low back pain   . Myocardial infarction ([place]) 01/1995  . Nephrolithiasis 2006   stone extraction   . Panic attacks   . Peripheral neuropathy   . Peripheral vascular disease ([facility])    legs  . Pneumonia 12/22/2019  . Sciatic pain    right  . Small cell lung cancer ([place])   . Tobacco abuse   . Tremor     Past Surgical History:  Procedure Laterality Date  . AGILE CAPSULE N/A 03/07/2020   Procedure: AGILE CAPSULE;  Surgeon: Daneil Dolin, MD;  [facility];  Service: Endoscopy;  Laterality: N/A;  7:30am  . BACK SURGERY     fusion  . BRONCHIAL NEEDLE ASPIRATION BIOPSY N/A 05/20/2020   Procedure: BRONCHIAL NEEDLE ASPIRATION BIOPSIES;  Surgeon: Collene Gobble, MD;  Location: [facility] ENDOSCOPY;  Service: Pulmonary;  Laterality: N/A;  .  CHOLECYSTECTOMY    . COLONOSCOPY  01/2009   GMW:[id number] rectum/repeat in 5 yrs  . COLONOSCOPY N/A 04/29/2014   Dr.Rourk- attempted/incomplete colonoscopy. inadequate prep  . COLONOSCOPY N/A 05/27/2014   DGU:[id number] coli. Colonic polyps-removed as described above.Status post segmental biopsy. single tubular adenoma and random colon bx neg  . COLONOSCOPY WITH PROPOFOL N/A 12/23/2019   Procedure: COLONOSCOPY WITH PROPOFOL;  Surgeon: Ronnette Juniper, MD;  Location: [place];  Service: Gastroenterology;  Laterality: N/A;  . DILATION AND CURETTAGE OF UTERUS  1974  . ESOPHAGOGASTRODUODENOSCOPY  05/2010   Dr. Tessie Fass, erosion. 20F dilation  . ESOPHAGOGASTRODUODENOSCOPY N/A 04/29/2014   Dr.Rourk- normal esophagus s/p passage of maloney dilator. small hiatal hernia- bx= chronic inflammation.  . ESOPHAGOGASTRODUODENOSCOPY (EGD) WITH PROPOFOL N/A 12/23/2019   Procedure:  ESOPHAGOGASTRODUODENOSCOPY (EGD) WITH PROPOFOL;  Surgeon: Ronnette Juniper, MD;  Location: [place];  Service: Gastroenterology;  Laterality: N/A;  . GIVENS CAPSULE STUDY N/A 03/22/2020   Procedure: GIVENS CAPSULE STUDY;  Surgeon: Daneil Dolin, MD;  [facility];  Service: Endoscopy;  Laterality: N/A;  7:30am  . HEMOSTASIS CLIP PLACEMENT  12/23/2019   Procedure: HEMOSTASIS CLIP PLACEMENT;  Surgeon: Ronnette Juniper, MD;  Location: [place];  Service: Gastroenterology;;  . HOT HEMOSTASIS N/A 12/23/2019   Procedure: HOT HEMOSTASIS (ARGON PLASMA COAGULATION/BICAP);  Surgeon: Ronnette Juniper, MD;  Location: [place];  Service: Gastroenterology;  Laterality: N/A;  . LEFT HEART CATH AND CORONARY ANGIOGRAPHY N/A 12/24/2019   Procedure: LEFT HEART CATH AND CORONARY ANGIOGRAPHY;  Surgeon: Nigel Mormon, MD;  Location: [place] [facility];  Service: Cardiovascular;  Laterality: N/A;  . Venia Minks DILATION N/A 04/29/2014   Procedure: Venia Minks DILATION;  Surgeon: Daneil Dolin, MD;  [facility];  Service: Endoscopy;  Laterality: N/A;   . PARTIAL HYSTERECTOMY  1978  . POLYPECTOMY  12/23/2019   Procedure: POLYPECTOMY;  Surgeon: Ronnette Juniper, MD;  Location[facility] ENDOSCOPY;  Service: Gastroenterology;;  . SHOULDER SURGERY Left    Left shoulder for RTC;left arm surgery '98/left hand surgery 2001  . TOTAL ABDOMINAL HYSTERECTOMY W/ BILATERAL SALPINGOOPHORECTOMY  2002  . UMBILICAL HERNIA REPAIR  2008  . VIDEO BRONCHOSCOPY WITH ENDOBRONCHIAL ULTRASOUND N/A 05/20/2020   Procedure: VIDEO BRONCHOSCOPY WITH ENDOBRONCHIAL ULTRASOUND;  Surgeon: Collene Gobble, MD;  Location: [facility]COPY;  Service: Pulmonary;  Laterality: N/A;    OB History    Gravida  3   Para  2   Term  2   Preterm      AB  1   Living  2     SAB  1   IAB      Ectopic      Multiple      Live Births              Allergies  Allergen Reactions  . Iohexol      Desc: CHEST TIGHTNESS,BRETHING PROBLEMS NEEDS PRE MEDS 13 hour premeds given 06/08/2020 without incident   . Tape Other (See Comments)    Tears skin  . Ciprofloxacin Rash  . Latex Rash    GLOVES   . Penicillins Swelling    Did it involve swelling of the face/tongue/throat, SOB, or low BP? Yes Did it involve sudden or severe rash/hives, skin peeling, or any reaction on the inside of your mouth or nose? No Did you need to seek medical attention at a hospital or doctor's office? Yes When did it last happen?1996 If all above answers are "NO", may proceed with cephalosporin use.  . Povidone-Iodine Rash    Social History   Socioeconomic History  . Marital status: Widowed    Spouse name: Not on file  . Number of children: 2  . Years of education: Not on file  . Highest education level: Not on file  Occupational History  . 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 Smoking status: Former Smoker    Packs/day: 0.50    Years: 40.00    Pack years: 20.00    Types: Cigarettes    Quit date: 08/30/2017    Years  since quitting: 3.2  . Smokeless tobacco: Never Used  Vaping Use  . Vaping Use: Never used  Substance and Sexual Activity  .  Alcohol use: No  . Drug use: No  . Sexual activity: Yes    Birth control/protection: Surgical    Comment: hyst  Other Topics Concern  . Not on file  Social History Narrative   Married x3   Lives with husband   GED in 2005   Social Determinants of Health   Financial Resource Strain: Low Risk   . Difficulty of Paying Living Expenses: Not hard at all  Food Insecurity: No Food Insecurity  . Worried About Charity fundraiser in the Last Year: Never true  . Ran Out of Food in the Last Year: Never true  Transportation Needs: No Transportation Needs  . Lack of Transportation (Medical): No  . Lack of Transportation (Non-Medical): No  Physical Activity: Inactive  . Days of Exercise per Week: 0 days  . Minutes of Exercise per Session: 0 min  Stress: No Stress Concern Present  . Feeling of Stress : Not at all  Social Connections: Unknown  . Frequency of Communication with Friends and Family: Three times a week  . Frequency of Social Gatherings with Friends and Family: Once a week  . Attends Religious Services: Patient refused  . Active Member of Clubs or Organizations: No  . Attends Archivist Meetings: Never  . Marital Status: Widowed    Family History  Problem Relation Age of Onset  . Depression Mother   . Bipolar disorder Mother   . Dementia Mother   . Pulmonary fibrosis Mother   . Alcohol abuse Father   . Aneurysm Father        deceased age 69, brain  . Colon cancer Paternal Grandfather        age greater than 65    Medications:       Current Outpatient Medications:  .  albuterol (PROVENTIL HFA;VENTOLIN HFA) 108 (90 BASE) MCG/ACT inhaler, Inhale 2 puffs into the lungs every 4 (four) hours as needed for wheezing or shortness of breath., Disp: 1 Inhaler, Rfl: 0 .  aspirin-acetaminophen-caffeine (EXCEDRIN MIGRAINE) 250-250-65 MG tablet,  Take 2 tablets by mouth daily as needed for headache. Up to 4 times a week, Disp: , Rfl:  .  b complex vitamins tablet, Take 1 tablet by mouth daily. With Zinc and vitamin C, Disp: , Rfl:  .  Benzocaine (BOIL-EASE EX), Apply 1 application topically daily as needed (Boil)., Disp: , Rfl:  .  Biotin 5000 MCG TABS, Take 10,000 mcg by mouth daily. Keratin, Disp: , Rfl:  .  carbamide peroxide (DEBROX) 6.5 % OTIC solution, 5 drops daily as needed., Disp: , Rfl:  .  Cholecalciferol (VITAMIN D3) 50 MCG (2000 UT) TABS, Take 4,000 Units by mouth daily., Disp: , Rfl:  .  Cyanocobalamin (VITAMIN B-12 PO), Take by mouth. Vit B6 also, Disp: , Rfl:  .  Cyanocobalamin 2500 MCG CHEW, Chew 2,500 mcg by mouth daily., Disp: , Rfl:  .  diazepam (VALIUM) 5 MG tablet, Take 5 mg by mouth daily as needed., Disp: , Rfl:  .  diphenhydrAMINE (BENADRYL) 50 MG capsule, Take 58m 1 hour before scheduled CT, Disp: 1 capsule, Rfl: 0 .  ferrous sulfate 325 (65 FE) MG tablet, Take 325 mg by mouth daily with breakfast., Disp: , Rfl:  .  Fluticasone-Umeclidin-Vilant (TRELEGY ELLIPTA) 100-62.5-25 MCG/INH AEPB, Inhale 1 puff into the lungs daily., Disp: 28 each, Rfl: 5 .  Fluticasone-Umeclidin-Vilant (TRELEGY ELLIPTA) 100-62.5-25 MCG/INH AEPB, Inhale 1 puff into the lungs daily., Disp: 60 each, Rfl: 0 .  furosemide (LASIX)  40 MG tablet, Take 1 tablet (40 mg total) by mouth 2 (two) times daily. (Patient taking differently: Take 40 mg by mouth every other day.), Disp: 30 tablet, Rfl: 1 .  Ginkgo Biloba Extract 60 MG CAPS, Take 2 capsules by mouth daily. , Disp: , Rfl:  .  hydrocortisone cream 1 %, Apply 1 application topically daily as needed for itching., Disp: , Rfl:  .  Hydrocortisone, Perianal, (PROCTO-PAK) 1 % CREA, Apply anorectally twice daily for two weeks., Disp: 28 g, Rfl: 0 .  levothyroxine (SYNTHROID) 100 MCG tablet, Take 112 mcg by mouth daily before breakfast. , Disp: , Rfl:  .  LORazepam (ATIVAN) 0.5 MG tablet, Take 1 tablet  30 minutes before the MRI.  Repeat once if needed. (Patient taking differently: Take 1 tablet 30 minutes before the MRI.  Repeat once if needed.), Disp: 2 tablet, Rfl: 0 .  metFORMIN (GLUCOPHAGE) 500 MG tablet, Take 1,000 mg by mouth 2 (two) times daily with a meal., Disp: , Rfl:  .  Multiple Vitamins-Minerals (MULTIVITAMIN WITH MINERALS) tablet, Take 1 tablet by mouth daily. Adult 50+, Disp: , Rfl:  .  neomycin-bacitracin-polymyxin (NEOSPORIN) OINT, Apply 1 application topically daily as needed for irritation or wound care., Disp: , Rfl:  .  Omega 3-6-9 Fatty Acids (OMEGA 3-6-9 COMPLEX PO), Take 2 tablets by mouth daily., Disp: , Rfl:  .  oxyCODONE-acetaminophen (PERCOCET/ROXICET) 5-325 MG tablet, Take 0.5 tablets by mouth 2 (two) times daily as needed for severe pain., Disp: , Rfl:  .  pantoprazole (PROTONIX) 40 MG tablet, Take 40 mg by mouth daily., Disp: , Rfl:  .  pravastatin (PRAVACHOL) 40 MG tablet, Take 40 mg by mouth daily., Disp: , Rfl:  .  predniSONE (DELTASONE) 50 MG tablet, Take 50 mg 13 hours, 7 hours, and 2 hours before scheduled CT scan, Disp: 3 tablet, Rfl: 0 .  pyridOXINE (VITAMIN B-6) 50 MG tablet, Take 200 mg by mouth daily., Disp: , Rfl:  .  silver sulfADIAZINE (SILVADENE) 1 % cream, Apply 2-3 times per day, Disp: 50 g, Rfl: 11 .  silver sulfADIAZINE (SILVADENE) 1 % cream, Use to area TID, Disp: 50 g, Rfl: 11 .  TechLite Lancets MISC, , Disp: , Rfl:  .  traZODone (DESYREL) 150 MG tablet, Take 150 mg by mouth at bedtime. , Disp: , Rfl:   Objective Blood pressure 120/63, pulse 80, height 5' 6.75" (1.695 m), weight 146 lb (66.2 kg).  75% healed up right vulvar boil No erythema no odor  Pertinent ROS No burning with urination, frequency or urgency No nausea, vomiting or diarrhea Nor fever chills or other constitutional symptoms   Labs or studies     Impression Diagnoses this Encounter::   ICD-10-CM   1. Vulvar boil, right  N76.4     Established relevant  diagnosis(es):   Plan/Recommendations: No orders of the defined types were placed in this encounter.   Labs or Scans Ordered: No orders of the defined types were placed in this encounter.   Management:: Continue local care Follow up 3 weeks and should be healed up completely by then  Follow up No follow-ups on file.        All questions were answered.

## 2020-11-12 DIAGNOSIS — E1165 Type 2 diabetes mellitus with hyperglycemia: Secondary | ICD-10-CM | POA: Diagnosis not present

## 2020-11-12 DIAGNOSIS — I10 Essential (primary) hypertension: Secondary | ICD-10-CM | POA: Diagnosis not present

## 2020-11-12 DIAGNOSIS — E785 Hyperlipidemia, unspecified: Secondary | ICD-10-CM | POA: Diagnosis not present

## 2020-11-12 DIAGNOSIS — K7469 Other cirrhosis of liver: Secondary | ICD-10-CM | POA: Diagnosis not present

## 2020-11-17 ENCOUNTER — Other Ambulatory Visit: Payer: Self-pay

## 2020-11-17 ENCOUNTER — Ambulatory Visit (INDEPENDENT_AMBULATORY_CARE_PROVIDER_SITE_OTHER): Payer: Medicare HMO | Admitting: Pulmonary Disease

## 2020-11-17 ENCOUNTER — Encounter: Payer: Self-pay | Admitting: Pulmonary Disease

## 2020-11-17 VITALS — BP 124/66 | HR 85 | Temp 97.0°F | Ht 66.0 in | Wt 145.8 lb

## 2020-11-17 DIAGNOSIS — J449 Chronic obstructive pulmonary disease, unspecified: Secondary | ICD-10-CM

## 2020-11-17 DIAGNOSIS — N1832 Chronic kidney disease, stage 3b: Secondary | ICD-10-CM | POA: Diagnosis not present

## 2020-11-17 DIAGNOSIS — E1165 Type 2 diabetes mellitus with hyperglycemia: Secondary | ICD-10-CM | POA: Diagnosis not present

## 2020-11-17 DIAGNOSIS — D509 Iron deficiency anemia, unspecified: Secondary | ICD-10-CM | POA: Diagnosis not present

## 2020-11-17 DIAGNOSIS — J849 Interstitial pulmonary disease, unspecified: Secondary | ICD-10-CM | POA: Diagnosis not present

## 2020-11-17 DIAGNOSIS — I744 Embolism and thrombosis of arteries of extremities, unspecified: Secondary | ICD-10-CM | POA: Diagnosis not present

## 2020-11-17 DIAGNOSIS — J4540 Moderate persistent asthma, uncomplicated: Secondary | ICD-10-CM | POA: Diagnosis not present

## 2020-11-17 DIAGNOSIS — I10 Essential (primary) hypertension: Secondary | ICD-10-CM | POA: Diagnosis not present

## 2020-11-17 DIAGNOSIS — R944 Abnormal results of kidney function studies: Secondary | ICD-10-CM | POA: Diagnosis not present

## 2020-11-17 MED ORDER — ALBUTEROL SULFATE HFA 108 (90 BASE) MCG/ACT IN AERS: 2.0000 | INHALATION_SPRAY | RESPIRATORY_TRACT | 3 refills | Status: DC | PRN

## 2020-11-17 MED ORDER — TRELEGY ELLIPTA 100-62.5-25 MCG/INH IN AEPB: 1.0000 | INHALATION_SPRAY | Freq: Every day | RESPIRATORY_TRACT | 0 refills | Status: DC

## 2020-11-17 NOTE — Patient Instructions (Signed)
Continue trelegy one puff daily, and rinse your mouth after each use   Albuterol two puffs every 6 hours as needed for cough, wheeze, or chest congestion  Follow up in 1 year

## 2020-11-17 NOTE — Progress Notes (Signed)
[facility]  Chief Complaint  Patient presents with  . Follow-up    No complaints currently    Constitutional:  BP 124/66 (BP Location: Left Arm, Cuff Size: Normal)   Pulse 85   Temp (!) 97 F (36.1 C) (Other (Comment)) Comment (Src): wrist  Ht 5' 6"  (1.676 m)   Wt 145 lb 12.8 oz (66.1 kg)   SpO2 97% Comment: Room air  BMI 23.53 kg/m   Past Medical History:  CAD, HTN, DM type 2, HLD, Hypothyroidism, Depression, ETOH with cirrhosis, DVT, diastolic CHF, Colon polyp, CKD 2, Neuropathy, Tremor  Past Surgical History:  She  has a past surgical history that includes Dilation and curettage of uterus (1974); Partial hysterectomy (1978); Total abdominal hysterectomy w/ bilateral salpingoophorectomy (2002); Shoulder surgery (Left); Cholecystectomy; Umbilical hernia repair (2008); Colonoscopy (01/2009); Esophagogastroduodenoscopy (05/2010); Colonoscopy (N/A, 04/29/2014); Esophagogastroduodenoscopy (N/A, 04/29/2014); maloney dilation (N/A, 04/29/2014); Colonoscopy (N/A, 05/27/2014); Esophagogastroduodenoscopy (egd) with propofol (N/A, 12/23/2019); Colonoscopy with propofol (N/A, 12/23/2019); Hemostasis clip placement (12/23/2019); polypectomy (12/23/2019); Hot hemostasis (N/A, 12/23/2019); LEFT HEART CATH AND CORONARY ANGIOGRAPHY (N/A, 12/24/2019); Agile capsule (N/A, 03/07/2020); Givens capsule study (N/A, 03/22/2020); Back surgery; Bronchial needle aspiration biopsy (N/A, 05/20/2020); and Video bronchoscopy with endobronchial ultrasound (N/A, 05/20/2020).  Brief Summary:  [name] is a 70 y.o. female former smoker with COPD and ILD.      Subjective:   She had last round of chemo in November.  Breathing much better since she started on trelegy.  Has occasional cough.  Able to keep up with activities at home w/o difficulty.    Physical Exam:   Appearance - well kempt   ENMT - no sinus tenderness, no oral exudate, no LAN, Mallampati 2airway, no  stridor  Respiratory - decreased  breath sounds bilaterally, no wheezing or rales  CV - s1s2 regular rate and rhythm, no murmurs  Ext - no clubbing, no edema, absent Rt hand  Skin - no rashes  Psych - normal mood and affect   Pulmonary testing:   A1AT 02/17/20 >> 131  PFT 05/12/20 >> FEV1 2.20 (87%), FEV1% 77, DLCO 58%.  Poor technique due to coughing and unable to do lung volume testing  Chest Imaging:   CT chest 12/19/19 >> enlarged b/l hilar and mediastinal LN up to 18 mm, small b/l effusions, diffuse interstitial and interlobular septal prominence, 4.6 x 4 cm area of consolidation in RML, cirrhosis with small amount of ascites  CT chest 09/03/20 >> RML mass now 2.3 x 3.5 cm, subpleural reticulation/fibrosis at lung bases  Sleep Tests:    Cardiac Tests:   Echo 12/21/19 >> EF 55 to 60%, grade 2 DD, mild LA dilation, mild/mod MR  Social History:  She  reports that she quit smoking about 3 years ago. Her smoking use included cigarettes. She has a 20.00 pack-year smoking history. She has never used smokeless tobacco. She reports that she does not drink alcohol and does not use drugs.  Family History:  Her family history includes Alcohol abuse in her father; Aneurysm in her father; Bipolar disorder in her mother; Colon cancer in her paternal grandfather; Dementia in her mother; Depression in her mother; Pulmonary fibrosis in her mother.     Assessment/Plan:   COPD mixed type. - she wasn't able to complete PFT previously - don't seen utility in having her do PFT at this time - continue trelegy with prn albuterol  Interstitial lung disease. - has typical pattern for IPF/UIP - no  findings to suggest CTD - she would not be a candidate for antifibrotic therapy given history of liver disease - monitor clinically  Small cell carcinoma of the lung. - followed by Dr. Curt Bears with [facility] - completed 4 cycles of palliative systemic chemotherapy with  carboplatin, and etoposide with concurrent radiotherapy; she opted against additional chemotherapy at visit on 09/05/20  Hx of CAD. - followed by Dr. 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 - followed by Dr. Garfield Cornea with [facility]  Time Spent Involved in Patient Care on Day of Examination:  23 minutes  Follow up:  Patient Instructions  Continue trelegy one puff daily, and rinse your mouth after each use   Albuterol two puffs every 6 hours as needed for cough, wheeze, or chest congestion  Follow up in 1 year   Medication List:   Allergies as of 11/17/2020      Reactions   Iohexol     Desc: CHEST TIGHTNESS,BRETHING PROBLEMS NEEDS PRE MEDS 13 hour premeds given 06/08/2020 without incident    Tape Other (See Comments)   Tears skin   Ciprofloxacin Rash   Latex Rash   GLOVES   Penicillins Swelling   Did it involve swelling of the face/tongue/throat, SOB, or low BP? Yes Did it involve sudden or severe rash/hives, skin peeling, or any reaction on the inside of your mouth or nose? No Did you need to seek medical attention at a hospital or doctor's office? Yes When did it last happen?1996 If all above answers are "NO", may proceed with cephalosporin use.   Povidone-iodine Rash      Medication List       Accurate as of November 17, 2020 12:05 PM. If you have any questions, ask your nurse or doctor.        albuterol 108 (90 Base) MCG/ACT inhaler Commonly known as: VENTOLIN HFA Inhale 2 puffs into the lungs every 4 (four) hours as needed for wheezing or shortness of breath.   aspirin-acetaminophen-caffeine 250-250-65 MG tablet Commonly known as: EXCEDRIN MIGRAINE Take 2 tablets by mouth daily as needed for headache. Up to 4 times a week   b complex vitamins tablet Take 1 tablet by mouth daily. With Zinc and vitamin C   Biotin 5000 MCG Tabs Take 10,000 mcg by mouth  daily. Keratin   BOIL-EASE EX Apply 1 application topically daily as needed (Boil).   carbamide peroxide 6.5 % OTIC solution Commonly known as: DEBROX 5 drops daily as needed.   Cyanocobalamin 2500 MCG Chew Chew 2,500 mcg by mouth daily.   VITAMIN B-12 PO Take by mouth. Vit B6 also   diazepam 5 MG tablet Commonly known as: VALIUM Take 5 mg by mouth daily as needed.   diphenhydrAMINE 50 MG capsule Commonly known as: BENADRYL Take 71m 1 hour before scheduled CT   ferrous sulfate 325 (65 FE) MG tablet Take 325 mg by mouth daily with breakfast.   furosemide 40 MG tablet Commonly known as: LASIX Take 1 tablet (40 mg total) by mouth 2 (two) times daily. What changed: when to take this   Ginkgo Biloba Extract 60 MG Caps Take 2 capsules by mouth daily.   Hydrocortisone (Perianal) 1 % Crea Commonly known as: Procto-Pak Apply anorectally twice daily for two weeks.   hydrocortisone cream 1 % Apply 1 application topically daily as needed for itching.   levothyroxine 100 MCG tablet Commonly known as: SYNTHROID Take 112  mcg by mouth daily before breakfast.   LORazepam 0.5 MG tablet Commonly known as: ATIVAN Take 1 tablet 30 minutes before the MRI.  Repeat once if needed.   metFORMIN 500 MG tablet Commonly known as: GLUCOPHAGE Take 1,000 mg by mouth 2 (two) times daily with a meal.   multivitamin with minerals tablet Take 1 tablet by mouth daily. Adult 50+   neomycin-bacitracin-polymyxin Oint Commonly known as: NEOSPORIN Apply 1 application topically daily as needed for irritation or wound care.   OMEGA 3-6-9 COMPLEX PO Take 2 tablets by mouth daily.   oxyCODONE-acetaminophen 5-325 MG tablet Commonly known as: PERCOCET/ROXICET Take 0.5 tablets by mouth 2 (two) times daily as needed for severe pain.   pantoprazole 40 MG tablet Commonly known as: PROTONIX Take 40 mg by mouth daily.   pravastatin 40 MG tablet Commonly known as: PRAVACHOL Take 40 mg by mouth  daily.   predniSONE 50 MG tablet Commonly known as: DELTASONE Take 50 mg 13 hours, 7 hours, and 2 hours before scheduled CT scan   pyridOXINE 50 MG tablet Commonly known as: VITAMIN B-6 Take 200 mg by mouth daily.   silver sulfADIAZINE 1 % cream Commonly known as: Silvadene Apply 2-3 times per day   silver sulfADIAZINE 1 % cream Commonly known as: Silvadene Use to area TID   TechLite Lancets Misc   traZODone 150 MG tablet Commonly known as: DESYREL Take 150 mg by mouth at bedtime.   Trelegy Ellipta 100-62.5-25 MCG/INH Aepb Generic drug: Fluticasone-Umeclidin-Vilant Inhale 1 puff into the lungs daily.   Trelegy Ellipta 100-62.5-25 MCG/INH Aepb Generic drug: Fluticasone-Umeclidin-Vilant Inhale 1 puff into the lungs daily.   Vitamin D3 50 MCG (2000 UT) Tabs Take 4,000 Units by mouth daily.       Signature:  Chesley Mires, MD [place] Pager - [phone] 11/17/2020, 12:05 PM

## 2020-11-17 NOTE — Addendum Note (Signed)
Addended by: Merrilee Seashore on: 11/17/2020 12:24 PM   Modules accepted: Orders

## 2020-11-17 NOTE — Addendum Note (Signed)
Addended by: Merrilee Seashore on: 11/17/2020 12:14 PM   Modules accepted: Orders

## 2020-11-21 IMAGING — DX DG ABDOMEN 1V
1 series · 1 of 1 positions shown · non-contrast
Comparison: 02/21/2005.  CT, 10/30/2013.

CLINICAL DATA: Diarrhea since late last year. Patient given a agile
capsule

EXAM:
ABDOMEN - 1 VIEW

[abdomen kub]
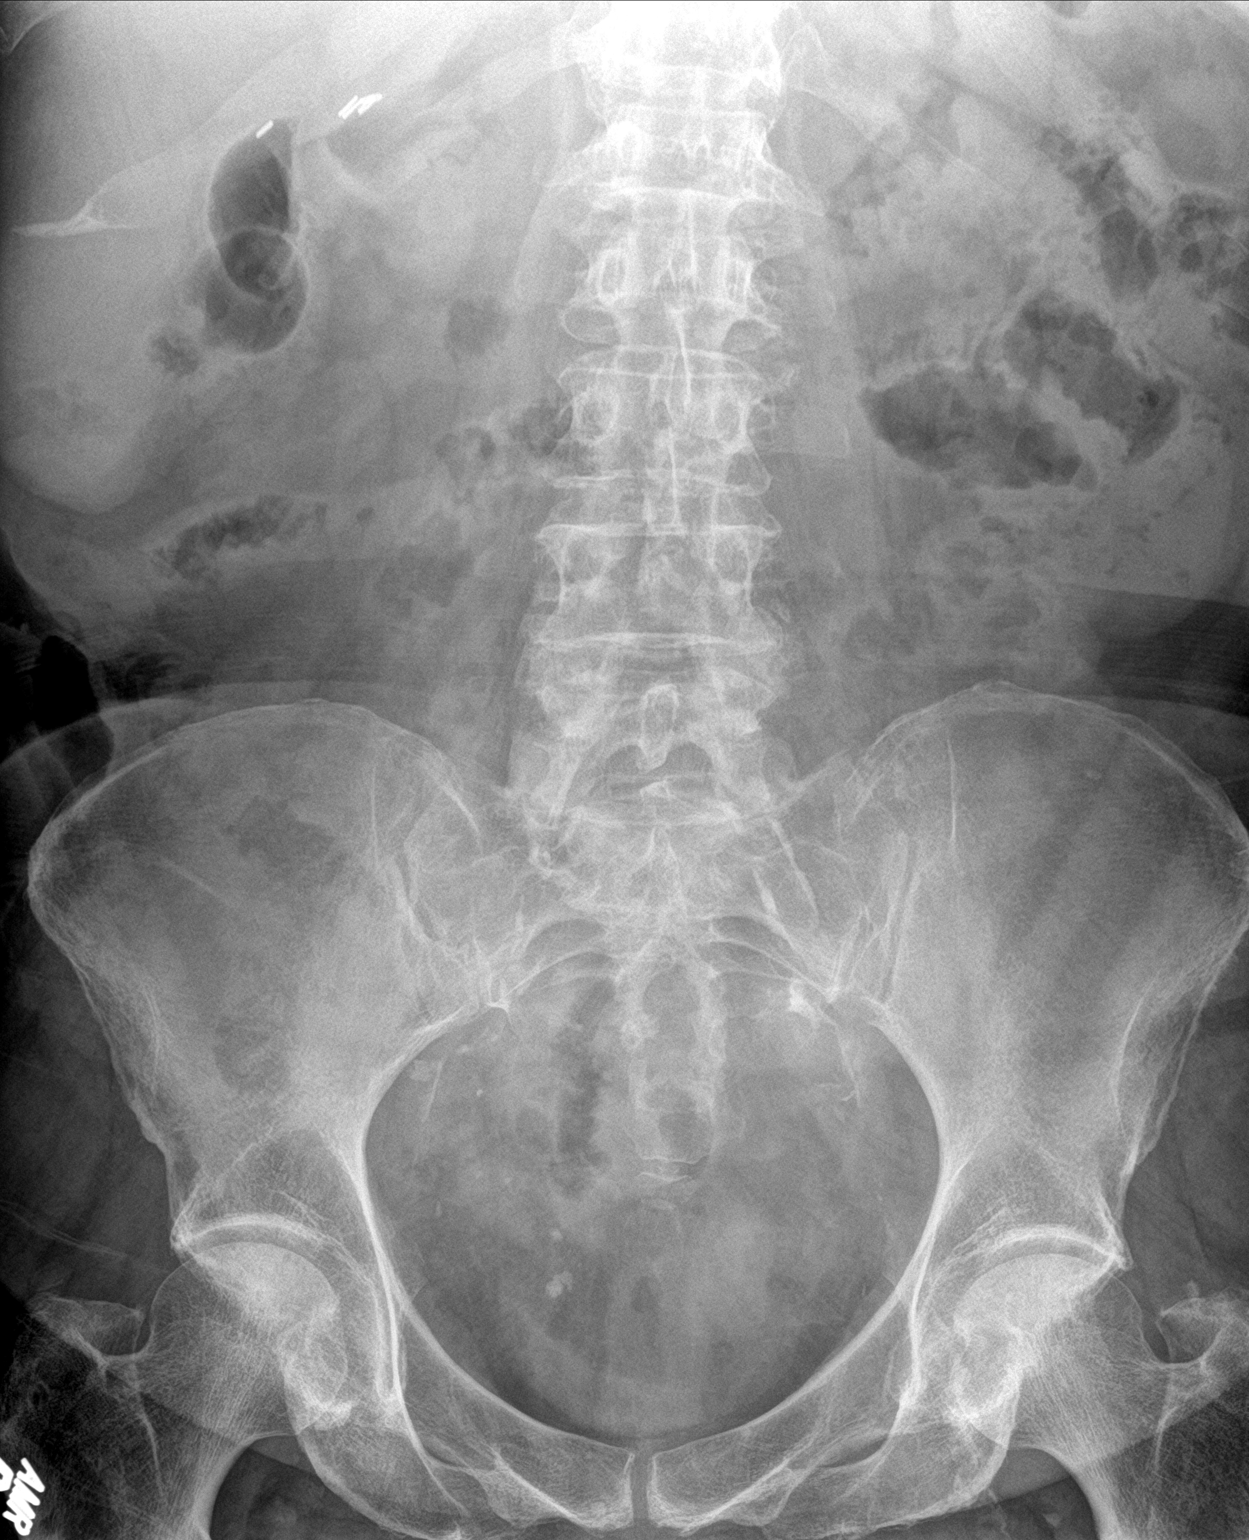

[1 of 1 positions shown; findings below may reference images not displayed]

FINDINGS: Normal bowel gas pattern.  No radiopaque capsule visualized.

Status post cholecystectomy. There are aortoiliac arterial
atherosclerotic calcifications. No evidence of renal or ureteral
stones. Renal stones noted on the prior CT are not visualized
radiographically. Soft tissues are otherwise unremarkable.

Sclerosis noted along both femoral heads consistent with chronic
avascular necrosis, which was present on the prior CT scan.
IMPRESSION: 1. No acute findings.  No evidence of bowel obstruction.

## 2020-11-22 DIAGNOSIS — F339 Major depressive disorder, recurrent, unspecified: Secondary | ICD-10-CM | POA: Diagnosis not present

## 2020-11-22 DIAGNOSIS — F411 Generalized anxiety disorder: Secondary | ICD-10-CM | POA: Diagnosis not present

## 2020-11-22 DIAGNOSIS — F17200 Nicotine dependence, unspecified, uncomplicated: Secondary | ICD-10-CM | POA: Diagnosis not present

## 2020-11-23 ENCOUNTER — Other Ambulatory Visit: Payer: Self-pay

## 2020-11-23 ENCOUNTER — Ambulatory Visit: Payer: Medicare HMO | Admitting: Cardiology

## 2020-11-23 ENCOUNTER — Encounter: Payer: Self-pay | Admitting: Cardiology

## 2020-11-23 VITALS — BP 147/74 | HR 68 | Temp 97.8°F | Resp 17 | Wt 147.0 lb

## 2020-11-23 DIAGNOSIS — I25118 Atherosclerotic heart disease of native coronary artery with other forms of angina pectoris: Secondary | ICD-10-CM | POA: Diagnosis not present

## 2020-11-23 NOTE — Progress Notes (Signed)
Follow up visit  Subjective:   [name], female    DOB: [birth year], 70 y.o.   MRN: [id number]   HPI   Chief Complaint  Patient presents with  . Coronary Artery Disease  . Follow-up    70 y.o. Caucasian female  with CAD (RCA CTO),  controlled hypertension, hyperlipidemia, type 2 diabetes mellitus, h/o GI Bleed,  former smoker, liver cirrhosis, hypothyroidism, COPD/asthma, depression, extensive stage small cell lung cancer with hilar, paratracheal adenopathy, and tiny solitary brain mets, s/p palliative chemo and radiotherapy.  At her visit in 08/2020 with Dr. [name], he noted no concerning findings for disease progression and further improvement in the primary bronchogenic neoplasm. She was offered 2 more cycles of systemic chemotherapy, as well as prophylactic cranial irradiation. However, she wanted to hold off. She is in good spirits. She denies chest pain, shortness of breath, palpitations, leg edema, orthopnea, PND, TIA/syncope. She is now on Xarelto due to diagnosis of superficial venous thrombosis in LUE (07/2020). She has not had any recurrent melena, hematochezia. Recent labs reviewed with the patient, details below.     Current Outpatient Medications on File Prior to Visit  Medication Sig Dispense Refill  . albuterol (VENTOLIN HFA) 108 (90 Base) MCG/ACT inhaler Inhale 2 puffs into the lungs every 4 (four) hours as needed for wheezing or shortness of breath. 1 each 3  . aspirin-acetaminophen-caffeine (EXCEDRIN MIGRAINE) 250-250-65 MG tablet Take 2 tablets by mouth daily as needed for headache. Up to 4 times a week    . b complex vitamins tablet Take 1 tablet by mouth daily. With Zinc and vitamin C    . Benzocaine (BOIL-EASE EX) Apply 1 application topically daily as needed (Boil).    . Biotin 5000 MCG TABS Take 10,000 mcg by mouth daily. Keratin    . carbamide peroxide (DEBROX) 6.5 % OTIC solution 5 drops daily as needed.    . Cholecalciferol (VITAMIN D3) 50 MCG  (2000 UT) TABS Take 4,000 Units by mouth daily.    . Cyanocobalamin (VITAMIN B-12 PO) Take by mouth. Vit B6 also    . Cyanocobalamin 2500 MCG CHEW Chew 2,500 mcg by mouth daily.    . diazepam (VALIUM) 5 MG tablet Take 5 mg by mouth daily as needed.    . diphenhydrAMINE (BENADRYL) 50 MG capsule Take 93m 1 hour before scheduled CT 1 capsule 0  . ferrous sulfate 325 (65 FE) MG tablet Take 325 mg by mouth daily with breakfast.    . Fluticasone-Umeclidin-Vilant (TRELEGY ELLIPTA) 100-62.5-25 MCG/INH AEPB Inhale 1 puff into the lungs daily. 28 each 5  . Fluticasone-Umeclidin-Vilant (TRELEGY ELLIPTA) 100-62.5-25 MCG/INH AEPB Inhale 1 puff into the lungs daily. 60 each 0  . Fluticasone-Umeclidin-Vilant (TRELEGY ELLIPTA) 100-62.5-25 MCG/INH AEPB Inhale 1 puff into the lungs daily. 60 each 0  . furosemide (LASIX) 40 MG tablet Take 1 tablet (40 mg total) by mouth 2 (two) times daily. (Patient taking differently: No sig reported) 30 tablet 1  . Ginkgo Biloba Extract 60 MG CAPS Take 2 capsules by mouth daily.     . hydrocortisone cream 1 % Apply 1 application topically daily as needed for itching.    .Marland KitchenHydrocortisone, Perianal, (PROCTO-PAK) 1 % CREA Apply anorectally twice daily for two weeks. 28 g 0  . levothyroxine (SYNTHROID) 100 MCG tablet Take 112 mcg by mouth daily before breakfast.     . LORazepam (ATIVAN) 0.5 MG tablet Take 1 tablet 30 minutes before the MRI.  Repeat once if needed. (Patient  taking differently: Take 1 tablet 30 minutes before the MRI.  Repeat once if needed.) 2 tablet 0  . metFORMIN (GLUCOPHAGE) 500 MG tablet Take 1,000 mg by mouth 2 (two) times daily with a meal.    . Multiple Vitamins-Minerals (MULTIVITAMIN WITH MINERALS) tablet Take 1 tablet by mouth daily. Adult 50+    . neomycin-bacitracin-polymyxin (NEOSPORIN) OINT Apply 1 application topically daily as needed for irritation or wound care.    . Omega 3-6-9 Fatty Acids (OMEGA 3-6-9 COMPLEX PO) Take 2 tablets by mouth daily.    Marland Kitchen  oxyCODONE-acetaminophen (PERCOCET/ROXICET) 5-325 MG tablet Take 0.5 tablets by mouth 2 (two) times daily as needed for severe pain.    . pantoprazole (PROTONIX) 40 MG tablet Take 40 mg by mouth daily.    . pravastatin (PRAVACHOL) 40 MG tablet Take 40 mg by mouth daily.    . predniSONE (DELTASONE) 50 MG tablet Take 50 mg 13 hours, 7 hours, and 2 hours before scheduled CT scan (Patient not taking: Reported on 11/17/2020) 3 tablet 0  . pyridOXINE (VITAMIN B-6) 50 MG tablet Take 200 mg by mouth daily.    . silver sulfADIAZINE (SILVADENE) 1 % cream Apply 2-3 times per day 50 g 11  . silver sulfADIAZINE (SILVADENE) 1 % cream Use to area TID 50 g 11  . TechLite Lancets MISC     . traZODone (DESYREL) 150 MG tablet Take 150 mg by mouth at bedtime.      No current facility-administered medications on file prior to visit.    Cardiovascular & other pertient studies:  EKG 11/23/2020: Sinus rhythm 67 bpm Old anteroseptal infarct Poor R-wave progression Nonspecific T-abnormality  MRI brain 09/21/2020: The previously identified cerebellar lesion is no longer seen. No new abnormality or visible metastatic disease today.  DVT US 07/2020: Left:  No evidence of deep vein thrombosis in the upper extremity. 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     Coronary angiography 12/24/2019: LM: Normal LAD: Minimal luminal irregularities LCx: Prox LCx 30%, prox 50% OM1 stenosis      RCA: Prox RCA CTO with right-to right bridging collaterals, and left-to-right collaterals  LVEDP 28 mmHg. Hb 7.9 g/dl.   Echocardiogram 12/21/2019: 1. Left ventricular ejection fraction, by estimation, is 55 to 60%. The  left ventricle has normal function. The left ventricle has no regional  wall motion abnormalities. Left ventricular diastolic parameters are  consistent with Grade II diastolic  dysfunction (pseudonormalization).  2. Right ventricular  systolic function is normal. The right ventricular  size is normal.  3. Left atrial size was mildly dilated.  4. The mitral valve is grossly normal. Mild to moderate mitral valve  regurgitation.  5. Trileaflet aortic valve with mild calcification. Mild aortic stenosis.  Mean PG 9 mmHg, Vmax 2.0 m/sec, AVA 2.1 cm2.  6. Estimated RA pressure 8 mmHg.   Recent labs: 09/05/2020: Glucose 110, BUN/Cr 15/1.18. EGFR 50. Na/K 139/4.4. Rest of the CMP normal H/H 8/24. MCV 109. Platelets 82  05/20/2020: Glucose 97, BUN/Cr 28/1.36. EGFR 40. Na/K 137/4.8. Albumin 3.2. Rest of the CMP normal H/H 9.8/31.1. MCV 95.7. Platelets 243 Chol 120, TG 90, HDL 41, LDL 62   01/11/2020: Glucose 210, BUN/Cr 21/1.44. EGFR 37. Na/K 138/4.4.  H/H 10.4/34.4. MCV 91. Platelets 165  12/29/2019: Glucose 124, BUN/Cr 29/1.67. EGFR 31. Na/K 140/4.8.  H/H 12/38. MCV 87. Platelets 290  12/23/2019: Glucose 75, BUN/Cr 19/1.1. EGFR 52. Na/K 143/3.6. Rest of the  CMP normal H/H 7.9/27.1. MCV 88. Platelets 233 HbA1C 8.2%  2015: TSH 3.8normal  2010: Chol 142, TG 189, HDL 36, LDL 68    Review of Systems  Cardiovascular: Negative for chest pain, dyspnea on exertion, leg swelling, palpitations and syncope.         Vitals:   11/23/20 1124  BP: (!) 147/74  Pulse: 68  Resp: 17  Temp: 97.8 F (36.6 C)  SpO2: 95%     Body mass index is 23.73 kg/m. Filed Weights   11/23/20 1124  Weight: 147 lb (66.7 kg)     Objective:   Physical Exam Vitals and nursing note reviewed.  Constitutional:      Appearance: She is well-developed.  Neck:     Vascular: No JVD.  Cardiovascular:     Rate and Rhythm: Normal rate and regular rhythm.     Pulses: Intact distal pulses.     Heart sounds: Normal heart sounds. No murmur heard.   Pulmonary:     Effort: Pulmonary effort is normal.     Breath sounds: Normal breath sounds. No wheezing or rales.  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 Caucasian female  with CAD (RCA CTO),  controlled hypertension, hyperlipidemia, type 2 diabetes mellitus, h/o GI Bleed,  former smoker, liver cirrhosis, hypothyroidism, COPD/asthma, depression, extensive stage small cell lung cancer with hilar, paratracheal adenopathy, and tiny solitary brain mets, s/p palliative chemo and radiotherapy   CAD: Known RCA CTO, without any ongoing angina symptoms. Not on aspirin due to ongoing use of Xarelto. LDL well controlled on pravastatin 40 mg daily. I will discussed with Dr. [name] regarding duration of Xarelto required for management of superficial thrombus.  If she could come off Xarelto, could cautiously start Plavix in the near future.  She has an upcoming appointment with her PCP next 2 months where her CBC can be repeated.  Anemia, thrombocytopenia: Prior history of GI bleeding, with more recent anemia and thrombocytopenia likely due to chemotherapy.  No bleeding issues.  F/u in 3 months  [place], MD [facility] [place] Pager: [phone] Office: [phone]

## 2020-11-24 ENCOUNTER — Ambulatory Visit
Admission: EM | Admit: 2020-11-24 | Discharge: 2020-11-24 | Disposition: A | Discharge status: Home / Self Care | Payer: Medicare HMO | Attending: Family Medicine | Admitting: Family Medicine

## 2020-11-24 ENCOUNTER — Other Ambulatory Visit: Payer: Self-pay

## 2020-11-24 DIAGNOSIS — W540XXA Bitten by dog, initial encounter: Secondary | ICD-10-CM

## 2020-11-24 DIAGNOSIS — L03114 Cellulitis of left upper limb: Secondary | ICD-10-CM

## 2020-11-24 MED ORDER — SULFAMETHOXAZOLE-TRIMETHOPRIM 800-160 MG PO TABS: 1.0000 | ORAL_TABLET | Freq: Two times a day (BID) | ORAL | 0 refills | Status: AC

## 2020-11-24 MED ORDER — METRONIDAZOLE 500 MG PO TABS: 500.0000 mg | ORAL_TABLET | Freq: Two times a day (BID) | ORAL | 0 refills | Status: DC

## 2020-11-24 NOTE — ED Triage Notes (Signed)
Pt presents with dog bite to left hand, pt bitten by own dog

## 2020-11-24 NOTE — Discharge Instructions (Signed)
I have sent in flagyl for you to take twice a day for 7 days  I have sent in bactrim for you to take twice daily for 7 days  Follow up with this office or with primary care if symptoms are persisting.  Follow up in the ER for high fever, trouble swallowing, trouble breathing, other concerning symptoms.

## 2020-11-28 NOTE — ED Provider Notes (Signed)
[name] CARE    CSN: [id number] Arrival date & time: 11/24/20  1502      History   Chief Complaint Chief Complaint  Patient presents with  . Animal Bite    HPI [name] is a 70 y.o. female.   Reports that she was bitten by her dog to her L hand and wrist. States that dog is up to date on rabies vaccines and does not wish to have vaccine series. Tetanus vaccine is up to date. She was seen in this office for a bite to her L cheek on 11/01/20. She was treated with flagyl and bactrim. States that the area is healing well and that she completed the medications. Family reports that patient is not ready to do anything about the dog biting her. Denies headache, fever, other symptoms.  ROS per HPI  The history is provided by the patient.  Animal Bite   Past Medical History:  Diagnosis Date  . Allergic rhinitis   . 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 Anemia   . Anxiety   . ASCVD (arteriosclerotic cardiovascular disease)    MI in 96 requiring BMS CX; DES to M1 in 2000;normal coronary angiography in 2004  . Cholelithiasis   . COPD (chronic obstructive pulmonary disease) ([place])   . DDD (degenerative disc disease), lumbar   . Depression   . Diabetes mellitus    Type II  . Diarrhea   . DVT (deep venous thrombosis) ([place])    patient said no  . GERD (gastroesophageal reflux disease)   . Headache    migraine  . History of kidney stones    2006  . Hyperlipidemia   . Hypotension   . Hypothyroidism   . Low back pain   . Myocardial infarction ([place]) 01/1995  . Nephrolithiasis 2006   stone extraction   . Panic attacks   . Peripheral neuropathy   . Peripheral vascular disease (HCC)    legs  . Pneumonia 12/22/2019  . Sciatic pain    right  . Small cell lung cancer ([place])   . Tobacco abuse   . Tremor     Patient Active Problem List   Diagnosis Date Noted  . 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 Encounter for  antineoplastic chemotherapy 05/30/2020  . Goals of care, counseling/discussion 05/30/2020  . Intolerance to cold 05/23/2020  . Mediastinal adenopathy 05/20/2020  . Abnormal weight loss 05/17/2020  . IDA (iron deficiency anemia) 02/15/2020  . Hepatic cirrhosis ([place]) 02/15/2020  . CKD (chronic kidney disease), stage IIIa 01/09/2020  . Lung mass 01/09/2020  . Orthostatic hypotension 12/31/2019  . Non-ST elevation (NSTEMI) myocardial infarction ([place])   . Anemia   . Dyspnea   . Hx of heart artery stent   . Benign hypertension with CKD (chronic kidney disease) stage III ([place])   . Former smoker   . Chest pain 12/20/2019  . Weakness 09/04/2014  . Dizziness 09/04/2014  . Hepatomegaly 03/31/2014  . Esophageal dysphagia 03/31/2014  . Unspecified constipation 03/31/2014  . AKI (acute kidney injury) ([place]) 10/30/2013  . Altered mental status 09/28/2013  . ETOH abuse 09/28/2013  . Altered mental state 09/28/2013  . Hyperkalemia 06/05/2013  . Bradycardia 06/05/2013  . Edema 06/02/2013  . Hyponatremia 06/02/2013  . Pneumonia due to infectious agent 12/26/2012  . Insomnia due to mental disorder 09/02/2012  . RECTAL PAIN 09/04/2010  . CHEST PAIN 08/22/2010  . Insulin dependent type 2  diabetes mellitus ([place]) 06/16/2010  . Coronary artery disease/Prior Stents 1997 and 2000/RCA occlusion 12/2019 06/16/2010  . MICROALBUMINURIA 06/01/2009  . 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 OTHER DYSPHAGIA 01/05/2009  . COLONIC POLYPS, ADENOMATOUS, HX OF 01/05/2009  . SCHATZKI'S RING, HX OF 01/05/2009  . BACK PAIN 08/04/2008  . DIARRHEA, CHRONIC 08/04/2008  . TOBACCO ABUSE 06/03/2007  . IBS 06/03/2007  . NEPHROLITHIASIS 02/04/2007  . ANXIETY STATE NOS 11/26/2006  . Hypothyroidism 11/01/2006  . Mixed hyperlipidemia 11/01/2006  . Depression 11/01/2006  . PERIPHERAL NEUROPATHY 11/01/2006  . CATARACT NOS 11/01/2006  . ALLERGIC RHINITIS 11/01/2006  . COPD (chronic obstructive pulmonary disease) ([place])  11/01/2006  . GERD 11/01/2006  . DEGENERATION, DISC NOS 11/01/2006  . LOW BACK PAIN 11/01/2006  . DVT, HX OF 11/01/2006    Past Surgical History:  Procedure Laterality Date  . AGILE CAPSULE N/A 03/07/2020   Procedure: AGILE CAPSULE;  Surgeon: Daneil Dolin, MD;  [facility];  Service: Endoscopy;  Laterality: N/A;  7:30am  . BACK SURGERY     fusion  . BRONCHIAL NEEDLE ASPIRATION BIOPSY N/A 05/20/2020   Procedure: BRONCHIAL NEEDLE ASPIRATION BIOPSIES;  Surgeon: Collene Gobble, MD;  Location: [facility] ENDOSCOPY;  Service: Pulmonary;  Laterality: N/A;  . CHOLECYSTECTOMY    . COLONOSCOPY  01/2009   GBT:[id number] rectum/repeat in 5 yrs  . COLONOSCOPY N/A 04/29/2014   Dr.Rourk- attempted/incomplete colonoscopy. inadequate prep  . COLONOSCOPY N/A 05/27/2014   WVP:[id number] coli. Colonic polyps-removed as described above.Status post segmental biopsy. single tubular adenoma and random colon bx neg  . COLONOSCOPY WITH PROPOFOL N/A 12/23/2019   Procedure: COLONOSCOPY WITH PROPOFOL;  Surgeon: Ronnette Juniper, MD;  Location: [place];  Service: Gastroenterology;  Laterality: N/A;  . DILATION AND CURETTAGE OF UTERUS  1974  . ESOPHAGOGASTRODUODENOSCOPY  05/2010   Dr. Tessie Fass, erosion. 80F dilation  . ESOPHAGOGASTRODUODENOSCOPY N/A 04/29/2014   Dr.Rourk- normal esophagus s/p passage of maloney dilator. small hiatal hernia- bx= chronic inflammation.  . ESOPHAGOGASTRODUODENOSCOPY (EGD) WITH PROPOFOL N/A 12/23/2019   Procedure: ESOPHAGOGASTRODUODENOSCOPY (EGD) WITH PROPOFOL;  Surgeon: Ronnette Juniper, MD;  Location: [place];  Service: Gastroenterology;  Laterality: N/A;  . GIVENS CAPSULE STUDY N/A 03/22/2020   Procedure: GIVENS CAPSULE STUDY;  Surgeon: Daneil Dolin, MD;  [facility];  Service: Endoscopy;  Laterality: N/A;  7:30am  . HEMOSTASIS CLIP PLACEMENT  12/23/2019   Procedure: HEMOSTASIS CLIP PLACEMENT;  Surgeon: Ronnette Juniper, MD;  Location: [place];  Service: Gastroenterology;;  .  HOT HEMOSTASIS N/A 12/23/2019   Procedure: HOT HEMOSTASIS (ARGON PLASMA COAGULATION/BICAP);  Surgeon: Ronnette Juniper, MD;  Location: [place];  Service: Gastroenterology;  Laterality: N/A;  . LEFT HEART CATH AND CORONARY ANGIOGRAPHY N/A 12/24/2019   Procedure: LEFT HEART CATH AND CORONARY ANGIOGRAPHY;  Surgeon: Nigel Mormon, MD;  Location: [place] [facility];  Service: Cardiovascular;  Laterality: N/A;  . Venia Minks DILATION N/A 04/29/2014   Procedure: Venia Minks DILATION;  Surgeon: Daneil Dolin, MD;  [facility];  Service: Endoscopy;  Laterality: N/A;  . PARTIAL HYSTERECTOMY  1978  . POLYPECTOMY  12/23/2019   Procedure: POLYPECTOMY;  Surgeon: Ronnette Juniper, MD;  Location: [facility] ENDOSCOPY;  Service: Gastroenterology;;  . SHOULDER SURGERY Left    Left shoulder for RTC;left arm surgery '98/left hand surgery 2001  . TOTAL ABDOMINAL HYSTERECTOMY W/ BILATERAL SALPINGOOPHORECTOMY  2002  . UMBILICAL HERNIA REPAIR  2008  . VIDEO BRONCHOSCOPY WITH ENDOBRONCHIAL ULTRASOUND N/A 05/20/2020   Procedure: VIDEO BRONCHOSCOPY WITH ENDOBRONCHIAL ULTRASOUND;  Surgeon: Collene Gobble, MD;  Location[facility] ENDOSCOPY;  Service: Pulmonary;  Laterality: N/A;    OB History    Gravida  3   Para  2   Term  2   Preterm      AB  1   Living  2     SAB  1   IAB      Ectopic      Multiple      Live Births               Home Medications    Prior to Admission medications   Medication Sig Start Date End Date Taking? Authorizing Provider  metroNIDAZOLE (FLAGYL) 500 MG tablet Take 1 tablet (500 mg total) by mouth 2 (two) times daily. 11/24/20  Yes Faustino Congress, NP  sulfamethoxazole-trimethoprim (BACTRIM DS) 800-160 MG tablet Take 1 tablet by mouth 2 (two) times daily for 7 days. 11/24/20 12/01/20 Yes Faustino Congress, NP  albuterol (VENTOLIN HFA) 108 (90 Base) MCG/ACT inhaler Inhale 2 puffs into the lungs every 4 (four) hours as needed for wheezing or shortness of breath. 11/17/20   Chesley Mires, MD  aspirin-acetaminophen-caffeine (EXCEDRIN MIGRAINE) [phone] MG tablet Take 2 tablets by mouth daily as needed for headache. Up to 4 times a week Patient not taking: Reported on 11/23/2020    [provider]  b complex vitamins tablet Take 1 tablet by mouth daily. With Zinc and vitamin C    [provider]  Benzocaine (BOIL-EASE EX) Apply 1 application topically daily as needed (Boil).    [provider]  Biotin 5000 MCG TABS Take 10,000 mcg by mouth daily. Keratin    [provider]  carbamide peroxide (DEBROX) 6.5 % OTIC solution 5 drops daily as needed.    [provider]  Cholecalciferol (VITAMIN D3) 50 MCG (2000 UT) TABS Take 4,000 Units by mouth daily.    [provider]  Cyanocobalamin (VITAMIN B-12 PO) Take by mouth. Vit B6 also    [provider]  Cyanocobalamin 2500 MCG CHEW Chew 2,500 mcg by mouth daily.    [provider]  diazepam (VALIUM) 5 MG tablet Take 5 mg by mouth daily as needed.    [provider]  Fluticasone-Umeclidin-Vilant (TRELEGY ELLIPTA) 100-62.5-25 MCG/INH AEPB Inhale 1 puff into the lungs daily. 05/31/20   Chesley Mires, MD  furosemide (LASIX) 40 MG tablet Take 1 tablet (40 mg total) by mouth 2 (two) times daily. Patient taking differently: Take 40 mg by mouth every 3 (three) days. 12/25/19   Shelly Coss, MD  Ginkgo Biloba Extract 60 MG CAPS Take 2 capsules by mouth daily.     [provider]  hydrocortisone cream 1 % Apply 1 application topically daily as needed for itching.    [provider]  Hydrocortisone, Perianal, (PROCTO-PAK) 1 % CREA Apply anorectally twice daily for two weeks. 07/12/20   Mahala Menghini, PA-C  levothyroxine (SYNTHROID) 100 MCG tablet Take 112 mcg by mouth daily before breakfast.     [provider]  metFORMIN (GLUCOPHAGE) 500 MG tablet Take 1,000 mg by mouth 2 (two) times daily with a meal. 09/18/19   [provider]   Multiple Vitamins-Minerals (MULTIVITAMIN WITH MINERALS) tablet Take 1 tablet by mouth daily. Adult 50+    [provider]  neomycin-bacitracin-polymyxin (NEOSPORIN) OINT Apply 1 application topically daily as needed for irritation or wound care.    [provider]  Omega 3-6-9 Fatty Acids (OMEGA 3-6-9 COMPLEX PO)  Take 2 tablets by mouth daily.    [provider]  oxyCODONE-acetaminophen (PERCOCET/ROXICET) 5-325 MG tablet Take 0.5 tablets by mouth 2 (two) times daily as needed for severe pain. 05/20/20   Collene Gobble, MD  pantoprazole (PROTONIX) 40 MG tablet Take 40 mg by mouth daily. 09/29/19   [provider]  pravastatin (PRAVACHOL) 40 MG tablet Take 40 mg by mouth daily. 07/11/20   [provider]  pyridOXINE (VITAMIN B-6) 50 MG tablet Take 200 mg by mouth daily.    [provider]  silver sulfADIAZINE (SILVADENE) 1 % cream Apply 2-3 times per day 10/17/20   Florian Buff, MD  TechLite Lancets MISC  07/18/20   [provider]  traZODone (DESYREL) 150 MG tablet Take 150 mg by mouth at bedtime.  08/19/15   [provider]  XARELTO 20 MG TABS tablet Take 20 mg by mouth daily. 11/17/20   [provider]    Family History Family History  Problem Relation Age of Onset  . Depression Mother   . Bipolar disorder Mother   . Dementia Mother   . Pulmonary fibrosis Mother   . Alcohol abuse Father   . Aneurysm Father        deceased age 80, brain  . Colon cancer Paternal Grandfather        age greater than 10    Social History Social History   Tobacco Use  . Smoking status: Former Smoker    Packs/day: 0.50    Years: 40.00    Pack years: 20.00    Types: Cigarettes    Quit date: 08/30/2017    Years since quitting: 3.2  . Smokeless tobacco: Never Used  Vaping Use  . Vaping Use: Never used  Substance Use Topics  . Alcohol use: No  . Drug use: No     Allergies   Iohexol, Tape, Ciprofloxacin, Latex,  Penicillins, and Povidone-iodine   Review of Systems Review of Systems   Physical Exam Triage Vital Signs ED Triage Vitals  Enc Vitals Group     BP --      Pulse Rate 11/24/20 1517 74     Resp 11/24/20 1517 16     Temp 11/24/20 1517 97.9 F (36.6 C)     Temp Source 11/24/20 1517 Tympanic     SpO2 11/24/20 1517 94 %     Weight --      Height --      Head Circumference --      Peak Flow --      Pain Score 11/24/20 1527 5     Pain Loc --      Pain Edu? --      Excl. in [place]? --    No data found.  Updated Vital Signs Pulse 74   Temp 97.9 F (36.6 C) (Tympanic)   Resp 16   SpO2 94%      Physical Exam Vitals and nursing note reviewed.  Constitutional:      General: She is not in acute distress.    Appearance: Normal appearance. She is well-developed and well-nourished. She is not ill-appearing.  HENT:     Head: Normocephalic and atraumatic.  Eyes:     Conjunctiva/sclera: Conjunctivae normal.  Cardiovascular:     Rate and Rhythm: Normal rate and regular rhythm.     Heart sounds: No murmur heard.   Pulmonary:     Effort: Pulmonary effort is normal. No respiratory distress.     Breath sounds:  Normal breath sounds.  Abdominal:     Palpations: Abdomen is soft.     Tenderness: There is no abdominal tenderness.  Musculoskeletal:        General: No edema. Normal range of motion.     Cervical back: Normal range of motion and neck supple.  Skin:    General: Skin is warm and dry.     Capillary Refill: Capillary refill takes less than 2 seconds.     Comments: Multiple puncture wounds to left hand and wrist  Neurological:     General: No focal deficit present.     Mental Status: She is alert and oriented to person, place, and time.  Psychiatric:        Mood and Affect: Mood and affect and mood normal.        Behavior: Behavior normal.        Thought Content: Thought content normal.      UC Treatments / Results  Labs (all labs ordered are listed, but only  abnormal results are displayed) Labs Reviewed - No data to display  EKG   Radiology No results found.  Procedures Procedures (including critical care time)  Medications Ordered in UC Medications - No data to display  Initial Impression / Assessment and Plan / UC Course  I have reviewed the triage vital signs and the nursing notes.  Pertinent labs & imaging results that were available during my care of the patient were reviewed by me and considered in my medical decision making (see chart for details).     Cellulitis of L upper extremity Dog bite  Wounds cleansed and dressed in office today Prescribed flagyl Prescribed bactrim  Discussed that the dog's aggression is putting her in danger Follow up with this office or with primary care if symptoms are persisting.  Follow up in the ER for high fever, trouble swallowing, trouble breathing, other concerning symptoms.    Final Clinical Impressions(s) / UC Diagnoses   Final diagnoses:  Cellulitis of left upper extremity  Dog bite, initial encounter     Discharge Instructions     I have sent in flagyl for you to take twice a day for 7 days  I have sent in bactrim for you to take twice daily for 7 days  Follow up with this office or with primary care if symptoms are persisting.  Follow up in the ER for high fever, trouble swallowing, trouble breathing, other concerning symptoms.     ED Prescriptions    Medication Sig Dispense Auth. Provider   metroNIDAZOLE (FLAGYL) 500 MG tablet Take 1 tablet (500 mg total) by mouth 2 (two) times daily. 14 tablet Faustino Congress, NP   sulfamethoxazole-trimethoprim (BACTRIM DS) 800-160 MG tablet Take 1 tablet by mouth 2 (two) times daily for 7 days. 14 tablet Faustino Congress, NP     PDMP not reviewed this encounter.   Faustino Congress, NP 11/28/20 1212

## 2020-12-01 ENCOUNTER — Encounter: Payer: Self-pay | Admitting: Obstetrics & Gynecology

## 2020-12-01 ENCOUNTER — Other Ambulatory Visit: Payer: Self-pay

## 2020-12-01 ENCOUNTER — Ambulatory Visit (INDEPENDENT_AMBULATORY_CARE_PROVIDER_SITE_OTHER): Payer: Medicare HMO | Admitting: Obstetrics & Gynecology

## 2020-12-01 VITALS — BP 107/53 | HR 88

## 2020-12-01 DIAGNOSIS — N764 Abscess of vulva: Secondary | ICD-10-CM

## 2020-12-01 NOTE — Progress Notes (Signed)
Follow up appointment for results  Chief Complaint  Patient presents with  . Follow-up    Blood pressure (!) 107/53, pulse 88.    Pt is seen today for on going surveillance of a right vulvar/buttock abscess S/p antibiotic therapy, I&D, topical silvadene She is doing much much better Now no pain or drainage  Exam Completely healed abscess, no erythema or tenderness  MEDS ordered this encounter: No orders of the defined types were placed in this encounter.   Orders for this encounter: No orders of the defined types were placed in this encounter.   Impression:   ICD-10-CM   1. Vulvar boil, right, resolved  N76.4    No further follow up needed, use silvadene prn     Plan: As above  Follow Up: Return if symptoms worsen or fail to improve.       Face to face time:  10 minutes  Greater than 50% of the visit time was spent in counseling and coordination of care with the patient.  The summary and outline of the counseling and care coordination is summarized in the note above.   All questions were answered.  Past Medical History:  Diagnosis Date  . Allergic rhinitis   . 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 Anemia   . Anxiety   . ASCVD (arteriosclerotic cardiovascular disease)    MI in 96 requiring BMS CX; DES to M1 in 2000;normal coronary angiography in 2004  . Cholelithiasis   . COPD (chronic obstructive pulmonary disease) ([place])   . DDD (degenerative disc disease), lumbar   . Depression   . Diabetes mellitus    Type II  . Diarrhea   . DVT (deep venous thrombosis) ([place])    patient said no  . GERD (gastroesophageal reflux disease)   . Headache    migraine  . History of kidney stones    2006  . Hyperlipidemia   . Hypotension   . Hypothyroidism   . Low back pain   . Myocardial infarction ([place]) 01/1995  . Nephrolithiasis 2006   stone extraction   . Panic attacks   . Peripheral neuropathy   . Peripheral vascular disease (HCC)    legs  .  Pneumonia 12/22/2019  . Sciatic pain    right  . Small cell lung cancer ([place])   . Tobacco abuse   . Tremor     Past Surgical History:  Procedure Laterality Date  . AGILE CAPSULE N/A 03/07/2020   Procedure: AGILE CAPSULE;  Surgeon: Daneil Dolin, MD;  [facility];  Service: Endoscopy;  Laterality: N/A;  7:30am  . BACK SURGERY     fusion  . BRONCHIAL NEEDLE ASPIRATION BIOPSY N/A 05/20/2020   Procedure: BRONCHIAL NEEDLE ASPIRATION BIOPSIES;  Surgeon: Collene Gobble, MD;  Location: [facility] ENDOSCOPY;  Service: Pulmonary;  Laterality: N/A;  . CHOLECYSTECTOMY    . COLONOSCOPY  01/2009   QIO:[id number] rectum/repeat in 5 yrs  . COLONOSCOPY N/A 04/29/2014   Dr.Rourk- attempted/incomplete colonoscopy. inadequate prep  . COLONOSCOPY N/A 05/27/2014   WUX:[id number] coli. Colonic polyps-removed as described above.Status post segmental biopsy. single tubular adenoma and random colon bx neg  . COLONOSCOPY WITH PROPOFOL N/A 12/23/2019   Procedure: COLONOSCOPY WITH PROPOFOL;  Surgeon: Ronnette Juniper, MD;  Location: [place];  Service: Gastroenterology;  Laterality: N/A;  . DILATION AND CURETTAGE OF UTERUS  1974  . ESOPHAGOGASTRODUODENOSCOPY  05/2010   Dr. Tessie Fass, erosion. 86F dilation  . ESOPHAGOGASTRODUODENOSCOPY N/A 04/29/2014  Dr.Rourk- normal esophagus s/p passage of maloney dilator. small hiatal hernia- bx= chronic inflammation.  . ESOPHAGOGASTRODUODENOSCOPY (EGD) WITH PROPOFOL N/A 12/23/2019   Procedure: ESOPHAGOGASTRODUODENOSCOPY (EGD) WITH PROPOFOL;  Surgeon: Ronnette Juniper, MD;  Location: [place];  Service: Gastroenterology;  Laterality: N/A;  . GIVENS CAPSULE STUDY N/A 03/22/2020   Procedure: GIVENS CAPSULE STUDY;  Surgeon: Daneil Dolin, MD;  [facility];  Service: Endoscopy;  Laterality: N/A;  7:30am  . HEMOSTASIS CLIP PLACEMENT  12/23/2019   Procedure: HEMOSTASIS CLIP PLACEMENT;  Surgeon: Ronnette Juniper, MD;  Location: [place];  Service: Gastroenterology;;  . HOT  HEMOSTASIS N/A 12/23/2019   Procedure: HOT HEMOSTASIS (ARGON PLASMA COAGULATION/BICAP);  Surgeon: Ronnette Juniper, MD;  Location: [place];  Service: Gastroenterology;  Laterality: N/A;  . LEFT HEART CATH AND CORONARY ANGIOGRAPHY N/A 12/24/2019   Procedure: LEFT HEART CATH AND CORONARY ANGIOGRAPHY;  Surgeon: Nigel Mormon, MD;  Location: [place] [facility];  Service: Cardiovascular;  Laterality: N/A;  . Venia Minks DILATION N/A 04/29/2014   Procedure: Venia Minks DILATION;  Surgeon: Daneil Dolin, MD;  [facility];  Service: Endoscopy;  Laterality: N/A;  . PARTIAL HYSTERECTOMY  1978  . POLYPECTOMY  12/23/2019   Procedure: POLYPECTOMY;  Surgeon: Ronnette Juniper, MD;  Location: [facility] ENDOSCOPY;  Service: Gastroenterology;;  . SHOULDER SURGERY Left    Left shoulder for RTC;left arm surgery '98/left hand surgery 2001  . TOTAL ABDOMINAL HYSTERECTOMY W/ BILATERAL SALPINGOOPHORECTOMY  2002  . UMBILICAL HERNIA REPAIR  2008  . VIDEO BRONCHOSCOPY WITH ENDOBRONCHIAL ULTRASOUND N/A 05/20/2020   Procedure: VIDEO BRONCHOSCOPY WITH ENDOBRONCHIAL ULTRASOUND;  Surgeon: Collene Gobble, MD;  Location: [facility] ENDOSCOPY;  Service: Pulmonary;  Laterality: N/A;    OB History    Gravida  3   Para  2   Term  2   Preterm      AB  1   Living  2     SAB  1   IAB      Ectopic      Multiple      Live Births              Allergies  Allergen Reactions  . Iohexol      Desc: CHEST TIGHTNESS,BRETHING PROBLEMS NEEDS PRE MEDS 13 hour premeds given 06/08/2020 without incident   . Tape Other (See Comments)    Tears skin  . Ciprofloxacin Rash  . Latex Rash    GLOVES   . Penicillins Swelling    Did it involve swelling of the face/tongue/throat, SOB, or low BP? Yes Did it involve sudden or severe rash/hives, skin peeling, or any reaction on the inside of your mouth or nose? No Did you need to seek medical attention at a hospital or doctor's office? Yes When did it last happen?1996 If all above  answers are "NO", may proceed with cephalosporin use.  . Povidone-Iodine Rash    Social History   Socioeconomic History  . Marital status: Widowed    Spouse name: Not on file  . Number of children: 2  . Years of education: Not on file  . Highest education level: Not on file  Occupational History  . 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 Smoking status: Former Smoker    Packs/day: 0.50    Years: 40.00    Pack years: 20.00    Types: Cigarettes    Quit date: 08/30/2017  Years since quitting: 3.2  . Smokeless tobacco: Never Used  Vaping Use  . Vaping Use: Never used  Substance and Sexual Activity  . Alcohol use: No  . Drug use: No  . Sexual activity: Yes    Birth control/protection: Surgical    Comment: hyst  Other Topics Concern  . Not on file  Social History Narrative   Married x3   Lives with husband   GED in 2005   Social Determinants of Health   Financial Resource Strain: Low Risk   . Difficulty of Paying Living Expenses: Not hard at all  Food Insecurity: No Food Insecurity  . Worried About Charity fundraiser in the Last Year: Never true  . Ran Out of Food in the Last Year: Never true  Transportation Needs: No Transportation Needs  . Lack of Transportation (Medical): No  . Lack of Transportation (Non-Medical): No  Physical Activity: Inactive  . Days of Exercise per Week: 0 days  . Minutes of Exercise per Session: 0 min  Stress: No Stress Concern Present  . Feeling of Stress : Not at all  Social Connections: Unknown  . Frequency of Communication with Friends and Family: Three times a week  . Frequency of Social Gatherings with Friends and Family: Once a week  . Attends Religious Services: Patient refused  . Active Member of Clubs or Organizations: No  . Attends Archivist Meetings: Never  . Marital Status: Widowed    Family History  Problem Relation Age of Onset  .  Depression Mother   . Bipolar disorder Mother   . Dementia Mother   . Pulmonary fibrosis Mother   . Alcohol abuse Father   . Aneurysm Father        deceased age 78, brain  . Colon cancer Paternal Grandfather        age greater than 58

## 2020-12-02 ENCOUNTER — Encounter (HOSPITAL_COMMUNITY): Payer: Self-pay

## 2020-12-02 ENCOUNTER — Ambulatory Visit (HOSPITAL_COMMUNITY)
Admission: RE | Admit: 2020-12-02 | Discharge: 2020-12-02 | Disposition: A | Discharge status: Home / Self Care | Payer: Medicare HMO | Source: Ambulatory Visit | Attending: Internal Medicine | Admitting: Internal Medicine

## 2020-12-02 DIAGNOSIS — J849 Interstitial pulmonary disease, unspecified: Secondary | ICD-10-CM | POA: Diagnosis not present

## 2020-12-02 DIAGNOSIS — C3490 Malignant neoplasm of unspecified part of unspecified bronchus or lung: Secondary | ICD-10-CM | POA: Diagnosis not present

## 2020-12-02 DIAGNOSIS — I2510 Atherosclerotic heart disease of native coronary artery without angina pectoris: Secondary | ICD-10-CM | POA: Diagnosis not present

## 2020-12-02 DIAGNOSIS — I700 Atherosclerosis of aorta: Secondary | ICD-10-CM | POA: Diagnosis not present

## 2020-12-02 HISTORY — DX: Essential (primary) hypertension: I10

## 2020-12-02 LAB — POCT I-STAT CREATININE: Creatinine, Ser: 1.3 mg/dL — ABNORMAL HIGH (ref 0.44–1.00)

## 2020-12-02 MED ORDER — IOHEXOL 300 MG/ML  SOLN
75.0000 mL | Freq: Once | INTRAMUSCULAR | Status: AC | PRN
  Administered 2020-12-02: 60 mL via INTRAVENOUS

## 2020-12-05 ENCOUNTER — Other Ambulatory Visit: Payer: Medicare HMO

## 2020-12-06 ENCOUNTER — Ambulatory Visit: Payer: Medicare HMO | Admitting: Internal Medicine

## 2020-12-08 ENCOUNTER — Encounter: Payer: Self-pay | Admitting: *Deleted

## 2020-12-12 ENCOUNTER — Encounter: Payer: Self-pay | Admitting: Radiation Oncology

## 2020-12-12 ENCOUNTER — Other Ambulatory Visit: Payer: Self-pay

## 2020-12-12 ENCOUNTER — Ambulatory Visit
Admission: RE | Admit: 2020-12-12 | Discharge: 2020-12-12 | Disposition: A | Discharge status: Home / Self Care | Payer: Medicare HMO | Source: Ambulatory Visit | Attending: Radiation Oncology | Admitting: Radiation Oncology

## 2020-12-12 DIAGNOSIS — C342 Malignant neoplasm of middle lobe, bronchus or lung: Secondary | ICD-10-CM | POA: Insufficient documentation

## 2020-12-12 DIAGNOSIS — Z7901 Long term (current) use of anticoagulants: Secondary | ICD-10-CM | POA: Insufficient documentation

## 2020-12-12 DIAGNOSIS — C7931 Secondary malignant neoplasm of brain: Secondary | ICD-10-CM | POA: Diagnosis not present

## 2020-12-12 DIAGNOSIS — Z923 Personal history of irradiation: Secondary | ICD-10-CM | POA: Insufficient documentation

## 2020-12-12 DIAGNOSIS — Z08 Encounter for follow-up examination after completed treatment for malignant neoplasm: Secondary | ICD-10-CM | POA: Diagnosis not present

## 2020-12-12 DIAGNOSIS — Z79899 Other long term (current) drug therapy: Secondary | ICD-10-CM | POA: Insufficient documentation

## 2020-12-12 NOTE — Progress Notes (Signed)
Patient is here today for follow up to radiation that was completed October 2021.   She reports having pain in the buttock area but that is not related.  She denies having any shortness of breath. She states she had a cough but that has since improved post radiation.  Reports her appetite is good.  She denies having any fatigue. Patient denies any swallowing issues or choking concerns.   Vitals:   12/12/20 1151  BP: (!) 132/56  Pulse: 72  Resp: 18  Temp: (!) 96.8 F (36 C)  TempSrc: Temporal  SpO2: 96%  Weight: 145 lb 6 oz (65.9 kg)  Height: 5' 6"  (1.676 m)

## 2020-12-12 NOTE — Progress Notes (Signed)
Radiation Oncology         [phone] ________________________________  Name: [name] MRN: [id number]  Date: 12/12/2020  DOB: 21-Apr-1951  Follow-Up Visit Note  CC: [name], MD  [name], MD    ICD-10-CM   1. 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 Since her last visit, she underwent a brain MRI on 09/21/2020 that showed resolution of the previously identified cerebellar lesion. There was no new abnormality or visible metastatic disease.  Of note, she has been seen in the ED on two different occasions, 11/01/2020 and 11/24/2020, for dog bites.  CT scan of chest on 12/02/2020 showed interval decrease in volume of the right middle lobe pulmonary mass. Right hilar para bronchovascular thickening was stable. There was no mediastinal lymphadenopathy or evidence of disease progression.  She is quite happy to hear these results and I printed the copy of her CT image for her to compare with her previous images  On review of systems, she reports overall feeling and breathing well. She denies visual problems or headaches.  She denies any pain within the chest area significant cough or hemoptysis.Marland Kitchen  ALLERGIES:  is allergic to iohexol, tape, ciprofloxacin, latex, penicillins, and povidone-iodine.  Meds: Current Outpatient Medications  Medication Sig Dispense Refill  . albuterol (VENTOLIN HFA) 108 (90 Base) MCG/ACT inhaler Inhale 2 puffs into  the lungs every 4 (four) hours as needed for wheezing or shortness of breath. 1 each 3  . aspirin-acetaminophen-caffeine (EXCEDRIN MIGRAINE) 250-250-65 MG tablet Take 2 tablets by mouth daily as needed for headache. Up to 4 times a week    . b complex vitamins tablet Take 1 tablet by mouth daily. With Zinc and vitamin C    . Benzocaine (BOIL-EASE EX) Apply 1 application topically daily as needed (Boil).    . Biotin 5000 MCG TABS Take 10,000 mcg by mouth daily. Keratin    . carbamide peroxide (DEBROX) 6.5 % OTIC solution 5 drops daily as needed.    . Cholecalciferol (VITAMIN D3) 50 MCG (2000 UT) TABS Take 4,000 Units by mouth daily.    . Cyanocobalamin (VITAMIN B-12 PO) Take by mouth. Vit B6 also    . Cyanocobalamin 2500 MCG CHEW Chew 2,500 mcg by mouth daily.    . diazepam (VALIUM) 5 MG tablet Take 5 mg by mouth daily as needed.    . Fluticasone-Umeclidin-Vilant (TRELEGY ELLIPTA) 100-62.5-25 MCG/INH AEPB Inhale 1 puff into the lungs daily. 28 each 5  . furosemide (LASIX) 40 MG tablet Take 1 tablet (40 mg total) by mouth 2 (two) times daily. (Patient taking differently: Take 40 mg by mouth every 3 (three) days.) 30 tablet 1  . Ginkgo Biloba Extract 60 MG CAPS Take 2 capsules by mouth daily.     . hydrocortisone cream 1 % Apply 1 application topically daily as needed for itching.    Marland Kitchen Hydrocortisone, Perianal, (PROCTO-PAK) 1 % CREA Apply anorectally twice daily for two weeks. 28 g  0  . levothyroxine (SYNTHROID) 100 MCG tablet Take 112 mcg by mouth daily before breakfast.     . metFORMIN (GLUCOPHAGE) 500 MG tablet Take 1,000 mg by mouth 2 (two) times daily with a meal.    . metroNIDAZOLE (FLAGYL) 500 MG tablet Take 1 tablet (500 mg total) by mouth 2 (two) times daily. 14 tablet 0  . Multiple Vitamins-Minerals (MULTIVITAMIN WITH MINERALS) tablet Take 1 tablet by mouth daily. Adult 50+    . neomycin-bacitracin-polymyxin (NEOSPORIN) OINT Apply 1 application topically daily as needed for irritation or  wound care.    . Omega 3-6-9 Fatty Acids (OMEGA 3-6-9 COMPLEX PO) Take 2 tablets by mouth daily.    Marland Kitchen oxyCODONE-acetaminophen (PERCOCET/ROXICET) 5-325 MG tablet Take 0.5 tablets by mouth 2 (two) times daily as needed for severe pain.    . pantoprazole (PROTONIX) 40 MG tablet Take 40 mg by mouth daily.    . pravastatin (PRAVACHOL) 40 MG tablet Take 40 mg by mouth daily.    Marland Kitchen pyridOXINE (VITAMIN B-6) 50 MG tablet Take 200 mg by mouth daily.    . silver sulfADIAZINE (SILVADENE) 1 % cream Apply 2-3 times per day 50 g 11  . TechLite Lancets MISC     . traZODone (DESYREL) 150 MG tablet Take 150 mg by mouth at bedtime.     Alveda Reasons 20 MG TABS tablet Take 20 mg by mouth daily.     No current facility-administered medications for this encounter.    Physical Findings: The patient is in no acute distress. Patient is alert and oriented.  height is 5' 6"  (1.676 m) and weight is 145 lb 6 oz (65.9 kg). Her temporal temperature is 96.8 F (36 C) (abnormal). Her blood pressure is 132/56 (abnormal) and her pulse is 72. Her respiration is 18 and oxygen saturation is 96%.  Lungs are clear to auscultation bilaterally. Heart has regular rate and rhythm. No palpable cervical, supraclavicular, or axillary adenopathy. Abdomen soft, non-tender, normal bowel sounds.  The neurological examination is nonfocal  Lab Findings: Lab Results  Component Value Date   WBC 10.1 09/05/2020   HGB 8.0 (L) 09/05/2020   HCT 24.3 (L) 09/05/2020   MCV 109.5 (H) 09/05/2020   PLT 82 (L) 09/05/2020    Radiographic Findings: CT Chest W Contrast  Result Date: 12/02/2020 CLINICAL DATA:  Lung cancer. Assess response to treatment is. Small cell lung cancer with brain metastasis EXAM: CT CHEST WITH CONTRAST TECHNIQUE: Multidetector CT imaging of the chest was performed during intravenous contrast administration. CONTRAST:  88m OMNIPAQUE IOHEXOL 300 MG/ML  SOLN COMPARISON:  CT chest 09/03/2019 FINDINGS: Cardiovascular: 1 Coronary artery  calcification and aortic atherosclerotic calcification. Mediastinum/Nodes: No axillary or supraclavicular adenopathy. No mediastinal or hilar adenopathy. No pericardial fluid. Esophagus normal. Peribronchial thickening at RIGHT hilum is not changed. Lungs/Pleura: RIGHT middle lobe pulmonary mass is decreased in size measuring 2.9 x 1.8 cm compared to 3.5 x 2.3 cm. No new nodularity. There is diffuse inter lobular septal thickening LEFT RIGHT lung. Subpleural reticulation in the upper lobes. Upper Abdomen: Limited view of the liver, kidneys, pancreas are unremarkable. Normal adrenal glands. Musculoskeletal: No aggressive osseous lesion. IMPRESSION: 1. Interval decrease in volume RIGHT middle lobe pulmonary mass. 2. Stable RIGHT hilar para bronchovascular thickening. 3. No mediastinal lymphadenopathy. 4. No evidence disease progression. 5. Interstitial lung disease stable. Electronically Signed   By: SSuzy BouchardM.D.   On: 12/02/2020 13:00    Impression: Extensive stage (T3, N2, M1c) small cell carcinoma of the  right middle lung with right hilar and low right paratracheal lymphadenopathy in addition to a solitary brain metastasis (minimal metastatic disease).  The patient is recovering from the effects of radiation. Most recent chest CT scan showed continued reduction in the size of the right middle lobe mass. There was no evidence of mediastinal lymphadenopathy or disease progression.   Plan:  The patient will follow up with radiation oncology in after completion of her brain MRI.  This is been ordered but has not been scheduled as of yet.  The patient did not receive radiation treatments to the brain but after her chemotherapy the solitary metastasis had resolved.  She did not wish to consider prophylactic cranial irradiation but continue with close follow-up and consider brain radiation therapy if needed in the future.  Total time spent in this encounter was 15 minutes which included reviewing the  patient's most recent ED visits, brain MRI, chest CT scan, physical examination, and documentation.  ____________________________________   Blair Promise, PhD, MD  This document serves as a record of services personally performed by [name], MD. It was created on his behalf by Clerance Lav, a trained medical scribe. The creation of this record is based on the scribe's personal observations and the provider's statements to them. This document has been checked and approved by the attending provider.

## 2020-12-14 ENCOUNTER — Encounter: Payer: Self-pay | Admitting: Gastroenterology

## 2020-12-14 ENCOUNTER — Other Ambulatory Visit: Payer: Self-pay

## 2020-12-14 ENCOUNTER — Ambulatory Visit (INDEPENDENT_AMBULATORY_CARE_PROVIDER_SITE_OTHER): Payer: Medicare HMO | Admitting: Gastroenterology

## 2020-12-14 VITALS — BP 146/69 | HR 73 | Temp 97.3°F | Ht 66.0 in | Wt 144.2 lb

## 2020-12-14 DIAGNOSIS — R197 Diarrhea, unspecified: Secondary | ICD-10-CM

## 2020-12-14 DIAGNOSIS — D509 Iron deficiency anemia, unspecified: Secondary | ICD-10-CM

## 2020-12-14 DIAGNOSIS — K7460 Unspecified cirrhosis of liver: Secondary | ICD-10-CM

## 2020-12-14 DIAGNOSIS — K219 Gastro-esophageal reflux disease without esophagitis: Secondary | ICD-10-CM

## 2020-12-14 MED ORDER — COLESTIPOL HCL 1 G PO TABS: 3.0000 g | ORAL_TABLET | Freq: Every day | ORAL | 5 refills | Status: DC

## 2020-12-14 NOTE — Patient Instructions (Signed)
1. Please have labs done when you go for labs with your PCP. Take our orders with you. Call if you have not heard from [place] regarding results.  2. Continue pantoprazole once daily for reflux. 3. Continue colestid 3 grams (3 tablets) once daily for diarrhea, do not take within two hours of other medications.  4. Return to the office in six months.

## 2020-12-14 NOTE — Progress Notes (Signed)
Primary Care Physician: [name], MD  Primary Gastroenterologist:  Garfield Cornea, MD   Chief Complaint  Patient presents with  . Cirrhosis    F/u. Doing okay. Needs colestid sent to [place] [facility]    HPI: [name] is a 70 y.o. female here for follow-up.  She was last seen in August 2021.  She has a history of IDA, diarrhea, cirrhosis by CT (March/2021).  In March 2021 when she was in the hospital for chest pain in [place]. Diagnosed with pneumonia. Noted to have cirrhosis with small ascites on imaging. No prior known cirrhosis. MELD Na of 15. Hgb noted to be in the 7-8 range. Hematochezia reported several months previous but no melena. Anemia labs c/w IDA. She was seen by [facility], Dr. Therisa Doyne. EGD/TCS completed during admission showing mild gastritis (no specimen collected), single polyp removed from colon (path-lipoma), single non-bleeding colonic angioectasia. Treated with argon plasma coagulation (APC). Friable (with contact bleeding) mucosa in the transverse colon. Clip (MR conditional) was placed. Hemoglobin on admission was 8.1 and at time of D/C was 7.9. she received one unit of prbcs. Patient had capsule endoscopy 03/22/20: few gastric erosions and small AVM. Few scattered small bowel erosions. Couple of small bowel AVMs.   Patient diagnosed with lung cancer August 2021.  CTA completed August 2021 to further evaluate chronic diarrhea.  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  Discussed with Dr. Sherren Mocha Early who she was already established with.  He felt aortic thrombus was insignificant and would not require further follow-up or treatment.  No evidence of mesenteric ischemia.  No need to further treat bilateral renal artery stenosis unless she had progressive renal insufficiency, baseline creatinine at the time was 1.5.  Patient feels well. Completed chemo/radiation for lung cancer.  States she is in remission. From a GI  standpoint her bowel movements have improved dramatically with Colestid.  No longer having diarrhea or incontinence.  No constipation.  No melena or rectal bleeding.  Appetite is good.  No weight loss.  No heartburn.  No abdominal pain.  Going for labs soon with PCP.   Current Outpatient Medications  Medication Sig Dispense Refill  . albuterol (VENTOLIN HFA) 108 (90 Base) MCG/ACT inhaler Inhale 2 puffs into the lungs every 4 (four) hours as needed for wheezing or shortness of breath. 1 each 3  . aspirin-acetaminophen-caffeine (EXCEDRIN MIGRAINE) 250-250-65 MG tablet Take 2 tablets by mouth daily as needed for headache. Up to 4 times a week    . b complex vitamins tablet Take 1 tablet by mouth daily. With Zinc and vitamin C    . Benzocaine (BOIL-EASE EX) Apply 1 application topically daily as needed (Boil).    . Biotin 5000 MCG TABS Take 10,000 mcg by mouth daily. Keratin    . carbamide peroxide (DEBROX) 6.5 % OTIC solution 5 drops daily as needed.    . Cholecalciferol (VITAMIN D3) 50 MCG (2000 UT) TABS Take 4,000 Units by mouth daily.    . Cyanocobalamin (VITAMIN B-12 PO) Take by mouth. Vit B6 also    . Cyanocobalamin 2500 MCG CHEW Chew 2,500 mcg by mouth daily.    . diazepam (VALIUM) 5 MG tablet Take 5 mg by mouth daily as needed.    . Fluticasone-Umeclidin-Vilant (TRELEGY ELLIPTA) 100-62.5-25 MCG/INH AEPB Inhale 1 puff into the lungs daily. 28 each 5  . furosemide (LASIX) 40 MG tablet Take 1 tablet (40 mg total) by mouth 2 (  two) times daily. (Patient taking differently: Take 40 mg by mouth every 3 (three) days.) 30 tablet 1  . Ginkgo Biloba Extract 60 MG CAPS Take 2 capsules by mouth daily.     . hydrocortisone cream 1 % Apply 1 application topically daily as needed for itching.    Marland Kitchen Hydrocortisone, Perianal, (PROCTO-PAK) 1 % CREA Apply anorectally twice daily for two weeks. 28 g 0  . levothyroxine (SYNTHROID) 100 MCG tablet Take 112 mcg by mouth daily before breakfast.     . metFORMIN  (GLUCOPHAGE) 500 MG tablet Take 1,000 mg by mouth 2 (two) times daily with a meal.    . metroNIDAZOLE (FLAGYL) 500 MG tablet Take 1 tablet (500 mg total) by mouth 2 (two) times daily. 14 tablet 0  . Multiple Vitamins-Minerals (MULTIVITAMIN WITH MINERALS) tablet Take 1 tablet by mouth daily. Adult 50+    . neomycin-bacitracin-polymyxin (NEOSPORIN) OINT Apply 1 application topically daily as needed for irritation or wound care.    . Omega 3-6-9 Fatty Acids (OMEGA 3-6-9 COMPLEX PO) Take 2 tablets by mouth daily.    Marland Kitchen oxyCODONE-acetaminophen (PERCOCET/ROXICET) 5-325 MG tablet Take 0.5 tablets by mouth 2 (two) times daily as needed for severe pain.    . pantoprazole (PROTONIX) 40 MG tablet Take 40 mg by mouth daily.    . pravastatin (PRAVACHOL) 40 MG tablet Take 40 mg by mouth daily.    Marland Kitchen pyridOXINE (VITAMIN B-6) 50 MG tablet Take 200 mg by mouth daily.    . silver sulfADIAZINE (SILVADENE) 1 % cream Apply 2-3 times per day 50 g 11  . TechLite Lancets MISC     . traZODone (DESYREL) 150 MG tablet Take 150 mg by mouth at bedtime.     Alveda Reasons 20 MG TABS tablet Take 20 mg by mouth daily.    . colestipol (COLESTID) 1 g tablet Take 3 g by mouth daily.     No current facility-administered medications for this visit.    Allergies as of 12/14/2020 - Review Complete 12/14/2020  Allergen Reaction Noted  . Iohexol  02/14/2005  . Tape Other (See Comments) 03/31/2014  . Ciprofloxacin Rash 02/04/2007  . Latex Rash 03/31/2014  . Penicillins Swelling 02/04/2007  . Povidone-iodine Rash 02/04/2007    ROS:  General: Negative for anorexia, weight loss, fever, chills, fatigue, weakness. ENT: Negative for hoarseness, difficulty swallowing , nasal congestion. CV: Negative for chest pain, angina, palpitations, dyspnea on exertion, peripheral edema.  Respiratory: Negative for dyspnea at rest, dyspnea on exertion, cough, sputum, wheezing.  GI: See history of present illness. GU:  Negative for dysuria, hematuria,  urinary incontinence, urinary frequency, nocturnal urination.  Endo: Negative for unusual weight change.    Physical Examination:   BP (!) 146/69   Pulse 73   Temp (!) 97.3 F (36.3 C)   Ht 5' 6"  (1.676 m)   Wt 144 lb 3.2 oz (65.4 kg)   BMI 23.27 kg/m   General: Well-nourished, well-developed in no acute distress.  Eyes: No icterus. Mouth: masked. Lungs: Clear to auscultation bilaterally.  Heart: Regular rate and rhythm, no murmurs rubs or gallops.  Abdomen: Bowel sounds are normal, nontender, nondistended, no hepatosplenomegaly or masses, no abdominal bruits or hernia , no rebound or guarding.   Extremities: No lower extremity edema. No clubbing or deformities. Neuro: Alert and oriented x 4   Skin: Warm and dry, no jaundice.   Psych: Alert and cooperative, normal mood and affect.  Labs:  Lab Results  Component Value Date  CREATININE 1.30 (H) 12/02/2020   BUN 15 09/05/2020   NA 139 09/05/2020   K 4.4 09/05/2020   CL 102 09/05/2020   CO2 29 09/05/2020   Lab Results  Component Value Date   ALT 25 09/05/2020   AST 21 09/05/2020   ALKPHOS 87 09/05/2020   BILITOT 0.3 09/05/2020   Lab Results  Component Value Date   WBC 10.1 09/05/2020   HGB 8.0 (L) 09/05/2020   HCT 24.3 (L) 09/05/2020   MCV 109.5 (H) 09/05/2020   PLT 82 (L) 09/05/2020   Lab Results  Component Value Date   IRON 29 05/18/2020   TIBC 288 05/18/2020   FERRITIN 9 (L) 12/21/2019     Lab Results  Component Value Date   INR 1.2 05/20/2020   INR 1.1 05/18/2020   INR 1.0 02/17/2020     Imaging Studies: CT Chest W Contrast  Result Date: 12/02/2020 CLINICAL DATA:  Lung cancer. Assess response to treatment is. Small cell lung cancer with brain metastasis EXAM: CT CHEST WITH CONTRAST TECHNIQUE: Multidetector CT imaging of the chest was performed during intravenous contrast administration. CONTRAST:  47m OMNIPAQUE IOHEXOL 300 MG/ML  SOLN COMPARISON:  CT chest 09/03/2019 FINDINGS: Cardiovascular: 1  Coronary artery calcification and aortic atherosclerotic calcification. Mediastinum/Nodes: No axillary or supraclavicular adenopathy. No mediastinal or hilar adenopathy. No pericardial fluid. Esophagus normal. Peribronchial thickening at RIGHT hilum is not changed. Lungs/Pleura: RIGHT middle lobe pulmonary mass is decreased in size measuring 2.9 x 1.8 cm compared to 3.5 x 2.3 cm. No new nodularity. There is diffuse inter lobular septal thickening LEFT RIGHT lung. Subpleural reticulation in the upper lobes. Upper Abdomen: Limited view of the liver, kidneys, pancreas are unremarkable. Normal adrenal glands. Musculoskeletal: No aggressive osseous lesion. IMPRESSION: 1. Interval decrease in volume RIGHT middle lobe pulmonary mass. 2. Stable RIGHT hilar para bronchovascular thickening. 3. No mediastinal lymphadenopathy. 4. No evidence disease progression. 5. Interstitial lung disease stable. Electronically Signed   By: SSuzy BouchardM.D.   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 Plan for vaccination at later date given lung cancer treatment (unless she has had in interim). Will continue to monitor, update labs. She is due for hepatoma screening at this time.    IDA: due for follow up labs. EGD/colonoscopy/capsule study completed. Recommend avoiding asa powders/NSAIDS. Continue PPI.   Chronic diarrhea: occurring for years. Previous random colon biopsies negative. Celiac screen negative. Stools negative. Inadequate management on bentyl. Symptoms present since gallbladder removed remotely. Started colestid 3 grams daily at time of last visit and she has had resolution of diarrhea.   Weight loss: stabilized. Appetite improved.   1. CMET, PT/INR, CBC, AFP.  We will move towards  hepatoma screening in the near future pending upcoming studies. 2. Continue pantoprazole 424mdaily before breakfast. 3. Continue Colestid 3 g once daily for diarrhea, do not take within 2 hours of other medications. 4. Return to the office in 6 months for follow-up.

## 2020-12-19 ENCOUNTER — Inpatient Hospital Stay: Payer: Medicare HMO | Attending: Internal Medicine

## 2020-12-19 ENCOUNTER — Inpatient Hospital Stay (HOSPITAL_BASED_OUTPATIENT_CLINIC_OR_DEPARTMENT_OTHER): Payer: Medicare HMO | Admitting: Internal Medicine

## 2020-12-19 ENCOUNTER — Other Ambulatory Visit: Payer: Self-pay

## 2020-12-19 ENCOUNTER — Telehealth: Payer: Self-pay | Admitting: Internal Medicine

## 2020-12-19 VITALS — BP 133/70 | HR 79 | Temp 98.0°F | Resp 15 | Ht 66.0 in | Wt 145.3 lb

## 2020-12-19 DIAGNOSIS — C3490 Malignant neoplasm of unspecified part of unspecified bronchus or lung: Secondary | ICD-10-CM | POA: Diagnosis not present

## 2020-12-19 DIAGNOSIS — C342 Malignant neoplasm of middle lobe, bronchus or lung: Secondary | ICD-10-CM

## 2020-12-19 DIAGNOSIS — D649 Anemia, unspecified: Secondary | ICD-10-CM | POA: Diagnosis not present

## 2020-12-19 DIAGNOSIS — K219 Gastro-esophageal reflux disease without esophagitis: Secondary | ICD-10-CM | POA: Diagnosis not present

## 2020-12-19 DIAGNOSIS — R197 Diarrhea, unspecified: Secondary | ICD-10-CM | POA: Diagnosis not present

## 2020-12-19 DIAGNOSIS — C7931 Secondary malignant neoplasm of brain: Secondary | ICD-10-CM | POA: Insufficient documentation

## 2020-12-19 DIAGNOSIS — K7460 Unspecified cirrhosis of liver: Secondary | ICD-10-CM | POA: Diagnosis not present

## 2020-12-19 DIAGNOSIS — D509 Iron deficiency anemia, unspecified: Secondary | ICD-10-CM | POA: Diagnosis not present

## 2020-12-19 LAB — CBC WITH DIFFERENTIAL (CANCER CENTER ONLY)
Abs Immature Granulocytes: 0.01 10*3/uL (ref 0.00–0.07)
Basophils Absolute: 0 10*3/uL (ref 0.0–0.1)
Basophils Relative: 0 %
Eosinophils Absolute: 0.2 10*3/uL (ref 0.0–0.5)
Eosinophils Relative: 3 %
HCT: 31.7 % — ABNORMAL LOW (ref 36.0–46.0)
Hemoglobin: 10.1 g/dL — ABNORMAL LOW (ref 12.0–15.0)
Immature Granulocytes: 0 %
Lymphocytes Relative: 21 %
Lymphs Abs: 1 10*3/uL (ref 0.7–4.0)
MCH: 32 pg (ref 26.0–34.0)
MCHC: 31.9 g/dL (ref 30.0–36.0)
MCV: 100.3 fL — ABNORMAL HIGH (ref 80.0–100.0)
Monocytes Absolute: 0.4 10*3/uL (ref 0.1–1.0)
Monocytes Relative: 9 %
Neutro Abs: 3.1 10*3/uL (ref 1.7–7.7)
Neutrophils Relative %: 66 %
Platelet Count: 186 10*3/uL (ref 150–400)
RBC: 3.16 MIL/uL — ABNORMAL LOW (ref 3.87–5.11)
RDW: 13.4 % (ref 11.5–15.5)
WBC Count: 4.6 10*3/uL (ref 4.0–10.5)
nRBC: 0 % (ref 0.0–0.2)

## 2020-12-19 LAB — CMP (CANCER CENTER ONLY)
ALT: 19 U/L (ref 0–44)
AST: 19 U/L (ref 15–41)
Albumin: 3.9 g/dL (ref 3.5–5.0)
Alkaline Phosphatase: 58 U/L (ref 38–126)
Anion gap: 9 (ref 5–15)
BUN: 17 mg/dL (ref 8–23)
CO2: 27 mmol/L (ref 22–32)
Calcium: 10 mg/dL (ref 8.9–10.3)
Chloride: 102 mmol/L (ref 98–111)
Creatinine: 1.18 mg/dL — ABNORMAL HIGH (ref 0.44–1.00)
GFR, Estimated: 50 mL/min — ABNORMAL LOW (ref >60)
Glucose, Bld: 87 mg/dL (ref 70–99)
Potassium: 5.1 mmol/L (ref 3.5–5.1)
Sodium: 138 mmol/L (ref 135–145)
Total Bilirubin: 0.3 mg/dL (ref 0.3–1.2)
Total Protein: 7.9 g/dL (ref 6.5–8.1)

## 2020-12-19 LAB — SAMPLE TO BLOOD BANK

## 2020-12-19 NOTE — Progress Notes (Signed)
[place] [phone]   [phone]  OFFICE PROGRESS NOTE  [name], MD [address] [place] [address]  DIAGNOSIS: Extensive stage (T3, N2, M1c) small cell lung cancer presented with right middle lobe lung mass in addition to right hilar and low right paratracheal adenopathy in addition to tiny solitary brain metastasis diagnosed in August 2021.  PRIOR THERAPY:  1) Palliative systemic chemotherapy with carboplatin for AUC of 5 on day 1, etoposide 100 mg/M2 on days 1, 2 and 3 with Neulasta support.    Status post 4 cycles.  This is concurrent with radiotherapy which was completed.   CURRENT THERAPY: Observation.  INTERVAL HISTORY: [name] 70 y.o. female returns to the clinic today for follow-up visit accompanied by friend.  The patient is feeling fine today with no concerning complaints.  She denied having any current chest pain, shortness of breath, cough or hemoptysis.  She denied having any nausea, vomiting, diarrhea or constipation.  She has no headache or visual changes.  She denied having any weight loss or night sweats.  She is currently on observation and she is feeling fine.  The patient had repeat CT scan of the chest performed recently and she is here for evaluation and discussion of her risk her results.  MEDICAL HISTORY: Past Medical History:  Diagnosis Date  . Allergic rhinitis   . 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 Anemia   . Anxiety   . ASCVD (arteriosclerotic cardiovascular disease)    MI in 96 requiring BMS CX; DES to M1 in 2000;normal coronary angiography in 2004  . Cholelithiasis   . COPD (chronic obstructive pulmonary disease) ([place])   . DDD (degenerative disc disease), lumbar   . Depression   . Diabetes mellitus    Type II  . Diarrhea   . DVT (deep venous thrombosis) ([facility])    patient said no  . GERD (gastroesophageal reflux disease)   . Headache    migraine  . History of kidney  stones    2006  . History of radiation therapy 06/28/2020-08/08/2020   right Lung; Dr. [name]  . Hyperlipidemia   . Hypertension   . Hypotension   . Hypothyroidism   . Low back pain   . Myocardial infarction ([facility]) 01/1995  . Nephrolithiasis 2006   stone extraction   . Panic attacks   . Peripheral neuropathy   . Peripheral vascular disease ([facility])    legs  . Pneumonia 12/22/2019  . Sciatic pain    right  . Small cell lung cancer ([place])   . Tobacco abuse   . Tremor     ALLERGIES:  is allergic to iohexol, tape, ciprofloxacin, latex, penicillins, and povidone-iodine.  MEDICATIONS:  Current Outpatient Medications  Medication Sig Dispense Refill  . albuterol (VENTOLIN HFA) 108 (90 Base) MCG/ACT inhaler Inhale 2 puffs into the lungs every 4 (four) hours as needed for wheezing or shortness of breath. 1 each 3  . aspirin-acetaminophen-caffeine (EXCEDRIN MIGRAINE) 250-250-65 MG tablet Take 2 tablets by mouth daily as needed for headache. Up to 4 times a week    . b complex vitamins tablet Take 1 tablet by mouth daily. With Zinc and vitamin C    . Benzocaine (BOIL-EASE EX) Apply 1 application topically daily as needed (Boil).    . Biotin 5000 MCG TABS Take 10,000 mcg by mouth daily. Keratin    . carbamide peroxide (DEBROX) 6.5 %  OTIC solution 5 drops daily as needed.    . Cholecalciferol (VITAMIN D3) 50 MCG (2000 UT) TABS Take 4,000 Units by mouth daily.    . colestipol (COLESTID) 1 g tablet Take 3 tablets (3 g total) by mouth daily. Do not take within two hours of other medications. 90 tablet 5  . Cyanocobalamin (VITAMIN B-12 PO) Take by mouth. Vit B6 also    . Cyanocobalamin 2500 MCG CHEW Chew 2,500 mcg by mouth daily.    . diazepam (VALIUM) 5 MG tablet Take 5 mg by mouth daily as needed.    . Fluticasone-Umeclidin-Vilant (TRELEGY ELLIPTA) 100-62.5-25 MCG/INH AEPB Inhale 1 puff into the lungs daily. 28 each 5  . furosemide (LASIX) 40 MG tablet Take 1 tablet (40 mg total) by mouth 2  (two) times daily. (Patient taking differently: Take 40 mg by mouth every 3 (three) days.) 30 tablet 1  . Ginkgo Biloba Extract 60 MG CAPS Take 2 capsules by mouth daily.     . hydrocortisone cream 1 % Apply 1 application topically daily as needed for itching.    Marland Kitchen Hydrocortisone, Perianal, (PROCTO-PAK) 1 % CREA Apply anorectally twice daily for two weeks. 28 g 0  . levothyroxine (SYNTHROID) 100 MCG tablet Take 112 mcg by mouth daily before breakfast.     . metFORMIN (GLUCOPHAGE) 500 MG tablet Take 1,000 mg by mouth 2 (two) times daily with a meal.    . metroNIDAZOLE (FLAGYL) 500 MG tablet Take 1 tablet (500 mg total) by mouth 2 (two) times daily. 14 tablet 0  . Multiple Vitamins-Minerals (MULTIVITAMIN WITH MINERALS) tablet Take 1 tablet by mouth daily. Adult 50+    . neomycin-bacitracin-polymyxin (NEOSPORIN) OINT Apply 1 application topically daily as needed for irritation or wound care.    . Omega 3-6-9 Fatty Acids (OMEGA 3-6-9 COMPLEX PO) Take 2 tablets by mouth daily.    Marland Kitchen oxyCODONE-acetaminophen (PERCOCET/ROXICET) 5-325 MG tablet Take 0.5 tablets by mouth 2 (two) times daily as needed for severe pain.    . pantoprazole (PROTONIX) 40 MG tablet Take 40 mg by mouth daily.    . pravastatin (PRAVACHOL) 40 MG tablet Take 40 mg by mouth daily.    Marland Kitchen pyridOXINE (VITAMIN B-6) 50 MG tablet Take 200 mg by mouth daily.    . silver sulfADIAZINE (SILVADENE) 1 % cream Apply 2-3 times per day 50 g 11  . TechLite Lancets MISC     . traZODone (DESYREL) 150 MG tablet Take 150 mg by mouth at bedtime.     Alveda Reasons 20 MG TABS tablet Take 20 mg by mouth daily.     No current facility-administered medications for this visit.    SURGICAL HISTORY:  Past Surgical History:  Procedure Laterality Date  . AGILE CAPSULE N/A 03/07/2020   Procedure: AGILE CAPSULE;  Surgeon: Daneil Dolin, MD;  [facility];  Service: Endoscopy;  Laterality: N/A;  7:30am  . BACK SURGERY     fusion  . BRONCHIAL NEEDLE  ASPIRATION BIOPSY N/A 05/20/2020   Procedure: BRONCHIAL NEEDLE ASPIRATION BIOPSIES;  Surgeon: Collene Gobble, MD;  Location: [facility] ENDOSCOPY;  Service: Pulmonary;  Laterality: N/A;  . CHOLECYSTECTOMY    . COLONOSCOPY  01/2009   DJS:[id number] rectum/repeat in 5 yrs  . COLONOSCOPY N/A 04/29/2014   Dr.Rourk- attempted/incomplete colonoscopy. inadequate prep  . COLONOSCOPY N/A 05/27/2014   ZCH:[id number] coli. Colonic polyps-removed as described above.Status post segmental biopsy. single tubular adenoma and random colon bx neg  . COLONOSCOPY WITH PROPOFOL N/A 12/23/2019  Procedure: COLONOSCOPY WITH PROPOFOL;  Surgeon: Ronnette Juniper, MD;  Location: [facility];  Service: Gastroenterology;  Laterality: N/A;  . DILATION AND CURETTAGE OF UTERUS  1974  . ESOPHAGOGASTRODUODENOSCOPY  05/2010   Dr. Tessie Fass, erosion. 57F dilation  . ESOPHAGOGASTRODUODENOSCOPY N/A 04/29/2014   Dr.Rourk- normal esophagus s/p passage of maloney dilator. small hiatal hernia- bx= chronic inflammation.  . ESOPHAGOGASTRODUODENOSCOPY (EGD) WITH PROPOFOL N/A 12/23/2019   Procedure: ESOPHAGOGASTRODUODENOSCOPY (EGD) WITH PROPOFOL;  Surgeon: Ronnette Juniper, MD;  Location: [place];  Service: Gastroenterology;  Laterality: N/A;  . GIVENS CAPSULE STUDY N/A 03/22/2020   Procedure: GIVENS CAPSULE STUDY;  Surgeon: Daneil Dolin, MD;  [facility];  Service: Endoscopy;  Laterality: N/A;  7:30am  . HEMOSTASIS CLIP PLACEMENT  12/23/2019   Procedure: HEMOSTASIS CLIP PLACEMENT;  Surgeon: Ronnette Juniper, MD;  Location: [facility];  Service: Gastroenterology;;  . HOT HEMOSTASIS N/A 12/23/2019   Procedure: HOT HEMOSTASIS (ARGON PLASMA COAGULATION/BICAP);  Surgeon: Ronnette Juniper, MD;  Location: [facility];  Service: Gastroenterology;  Laterality: N/A;  . LEFT HEART CATH AND CORONARY ANGIOGRAPHY N/A 12/24/2019   Procedure: LEFT HEART CATH AND CORONARY ANGIOGRAPHY;  Surgeon: Nigel Mormon, MD;  Location: [facility];  Service:  Cardiovascular;  Laterality: N/A;  . Venia Minks DILATION N/A 04/29/2014   Procedure: Venia Minks DILATION;  Surgeon: Daneil Dolin, MD;  [facility];  Service: Endoscopy;  Laterality: N/A;  . PARTIAL HYSTERECTOMY  1978  . POLYPECTOMY  12/23/2019   Procedure: POLYPECTOMY;  Surgeon: Ronnette Juniper, MD;  Location: [facility] ENDOSCOPY;  Service: Gastroenterology;;  . SHOULDER SURGERY Left    Left shoulder for RTC;left arm surgery '98/left hand surgery 2001  . TOTAL ABDOMINAL HYSTERECTOMY W/ BILATERAL SALPINGOOPHORECTOMY  2002  . UMBILICAL HERNIA REPAIR  2008  . VIDEO BRONCHOSCOPY WITH ENDOBRONCHIAL ULTRASOUND N/A 05/20/2020   Procedure: VIDEO BRONCHOSCOPY WITH ENDOBRONCHIAL ULTRASOUND;  Surgeon: Collene Gobble, MD;  Location: [facility] ENDOSCOPY;  Service: Pulmonary;  Laterality: N/A;    REVIEW OF SYSTEMS:  Constitutional: positive for fatigue Eyes: negative Ears, nose, mouth, throat, and face: negative Respiratory: negative Cardiovascular: negative Gastrointestinal: negative Genitourinary:negative Integument/breast: negative Hematologic/lymphatic: negative Musculoskeletal:negative Neurological: negative Behavioral/Psych: negative Endocrine: negative Allergic/Immunologic: negative   PHYSICAL EXAMINATION: General appearance: alert, cooperative, fatigued and no distress Head: Normocephalic, without obvious abnormality, atraumatic Neck: no adenopathy, no JVD, supple, symmetrical, trachea midline and thyroid not enlarged, symmetric, no tenderness/mass/nodules Lymph nodes: Cervical, supraclavicular, and axillary nodes normal. Resp: clear to auscultation bilaterally Back: symmetric, no curvature. ROM normal. No CVA tenderness. Cardio: regular rate and rhythm, S1, S2 normal, no murmur, click, rub or gallop GI: soft, non-tender; bowel sounds normal; no masses,  no organomegaly Extremities: extremities normal, atraumatic, no cyanosis or edema Neurologic: Alert and oriented X 3, normal strength and tone.  Normal symmetric reflexes. Normal coordination and gait  ECOG PERFORMANCE STATUS: 1 - Symptomatic but completely ambulatory  Blood pressure 133/70, pulse 79, temperature 98 F (36.7 C), temperature source Tympanic, resp. rate 15, height 5' 6"  (1.676 m), weight 145 lb 4.8 oz (65.9 kg), SpO2 94 %.  LABORATORY DATA: Lab Results  Component Value Date   WBC 10.1 09/05/2020   HGB 8.0 (L) 09/05/2020   HCT 24.3 (L) 09/05/2020   MCV 109.5 (H) 09/05/2020   PLT 82 (L) 09/05/2020      Chemistry      Component Value Date/Time   NA 139 09/05/2020 1111   NA 138 05/25/2020 0806   K 4.4 09/05/2020 1111   CL  102 09/05/2020 1111   CO2 29 09/05/2020 1111   BUN 15 09/05/2020 1111   BUN 26 05/25/2020 0806   CREATININE 1.30 (H) 12/02/2020 1001   CREATININE 1.18 (H) 09/05/2020 1111   CREATININE 0.85 05/03/2012 0942      Component Value Date/Time   CALCIUM 9.6 09/05/2020 1111   ALKPHOS 87 09/05/2020 1111   AST 21 09/05/2020 1111   ALT 25 09/05/2020 1111   BILITOT 0.3 09/05/2020 1111       RADIOGRAPHIC STUDIES: CT Chest W Contrast  Result Date: 12/02/2020 CLINICAL DATA:  Lung cancer. Assess response to treatment is. Small cell lung cancer with brain metastasis EXAM: CT CHEST WITH CONTRAST TECHNIQUE: Multidetector CT imaging of the chest was performed during intravenous contrast administration. CONTRAST:  64m OMNIPAQUE IOHEXOL 300 MG/ML  SOLN COMPARISON:  CT chest 09/03/2019 FINDINGS: Cardiovascular: 1 Coronary artery calcification and aortic atherosclerotic calcification. Mediastinum/Nodes: No axillary or supraclavicular adenopathy. No mediastinal or hilar adenopathy. No pericardial fluid. Esophagus normal. Peribronchial thickening at RIGHT hilum is not changed. Lungs/Pleura: RIGHT middle lobe pulmonary mass is decreased in size measuring 2.9 x 1.8 cm compared to 3.5 x 2.3 cm. No new nodularity. There is diffuse inter lobular septal thickening LEFT RIGHT lung. Subpleural reticulation in the upper  lobes. Upper Abdomen: Limited view of the liver, kidneys, pancreas are unremarkable. Normal adrenal glands. Musculoskeletal: No aggressive osseous lesion. IMPRESSION: 1. Interval decrease in volume RIGHT middle lobe pulmonary mass. 2. Stable RIGHT hilar para bronchovascular thickening. 3. No mediastinal lymphadenopathy. 4. No evidence disease progression. 5. Interstitial lung disease stable. Electronically Signed   By: SSuzy BouchardM.D.   On: 12/02/2020 13:00    ASSESSMENT AND PLAN: This is a very pleasant 70years old white female recently diagnosed with extensive stage small cell lung cancer (T3, N2, M1 C) presented with right middle lobe lung mass in addition to right hilar and low right paratracheal lymphadenopathy in addition to tiny solitary brain metastasis diagnosed in August 2021. The patient is currently undergoing systemic chemotherapy with carboplatin for AUC of 5 on day 1, etoposide 100 mg/M2 with Neulasta support.  Status post 4 cycles.  This was concurrent with radiotherapy.  The patient tolerated her treatment fairly well with no concerning adverse effects. The patient is currently on observation and she is feeling fine today with no concerning complaints.  There was resolution of the tiny solitary brain metastasis on the MRI of the brain performed recently.  She is currently on observation by Dr. KSondra Come She had repeat CT scan of the chest performed recently.  I personally and independently reviewed the scan and discussed the results with the patient and her friend. Her scan showed no concerning findings for disease progression or metastasis. I recommended for her to continue on observation with repeat CT scan of the chest in 3 months. For the anemia, she was advised to continue with oral iron tablet 1 tablet p.o. daily. The patient was advised to call immediately if she has any other concerning symptoms in the interval. The patient voices understanding of current disease status and  treatment options and is in agreement with the current care plan.  All questions were answered. The patient knows to call the clinic with any problems, questions or concerns. We can certainly see the patient much sooner if necessary.  Disclaimer: This note was dictated with voice recognition software. Similar sounding words can inadvertently be transcribed and may not be corrected upon review.

## 2020-12-19 NOTE — Telephone Encounter (Signed)
Scheduled per 03/07 los, patient has been called and notified.

## 2020-12-20 DIAGNOSIS — F339 Major depressive disorder, recurrent, unspecified: Secondary | ICD-10-CM | POA: Diagnosis not present

## 2020-12-20 DIAGNOSIS — F411 Generalized anxiety disorder: Secondary | ICD-10-CM | POA: Diagnosis not present

## 2020-12-20 DIAGNOSIS — F17200 Nicotine dependence, unspecified, uncomplicated: Secondary | ICD-10-CM | POA: Diagnosis not present

## 2020-12-20 LAB — CBC WITH DIFFERENTIAL/PLATELET

## 2020-12-20 LAB — COMPREHENSIVE METABOLIC PANEL
ALT: 17 IU/L (ref 0–32)
AST: 21 IU/L (ref 0–40)
Albumin/Globulin Ratio: 1.5 (ref 1.2–2.2)
Albumin: 4.3 g/dL (ref 3.8–4.8)
Alkaline Phosphatase: 62 IU/L (ref 44–121)
BUN/Creatinine Ratio: 12 (ref 12–28)
BUN: 15 mg/dL (ref 8–27)
Bilirubin Total: <0.2 mg/dL (ref 0.0–1.2)
CO2: 23 mmol/L (ref 20–29)
Calcium: 10.1 mg/dL (ref 8.7–10.3)
Chloride: 101 mmol/L (ref 96–106)
Creatinine, Ser: 1.26 mg/dL — ABNORMAL HIGH (ref 0.57–1.00)
Globulin, Total: 2.9 g/dL (ref 1.5–4.5)
Glucose: 83 mg/dL (ref 65–99)
Potassium: 5.4 mmol/L — ABNORMAL HIGH (ref 3.5–5.2)
Sodium: 138 mmol/L (ref 134–144)
Total Protein: 7.2 g/dL (ref 6.0–8.5)
eGFR: 46 mL/min/{1.73_m2} — ABNORMAL LOW (ref >59)

## 2020-12-20 LAB — IRON,TIBC AND FERRITIN PANEL
Ferritin: 14 ng/mL — ABNORMAL LOW (ref 15–150)
Iron Saturation: 10 % — ABNORMAL LOW (ref 15–55)
Iron: 41 ug/dL (ref 27–139)
Total Iron Binding Capacity: 400 ug/dL (ref 250–450)
UIBC: 359 ug/dL (ref 118–369)

## 2020-12-20 LAB — PROTIME-INR
INR: 1.5 — ABNORMAL HIGH (ref 0.9–1.2)
Prothrombin Time: 15.2 s — ABNORMAL HIGH (ref 9.1–12.0)

## 2020-12-20 LAB — AFP TUMOR MARKER: AFP, Serum, Tumor Marker: 2.8 ng/mL (ref 0.0–9.2)

## 2020-12-26 IMAGING — CT CT CHEST W/O CM
2 of 4 series · 15 of 36 positions shown, 18 images · non-contrast
Comparison: CT angio chest 12/19/2019

CLINICAL DATA: Follow-up mass.

EXAM:
CT CHEST WITHOUT CONTRAST
TECHNIQUE: Multidetector CT imaging of the chest was performed following the
standard protocol without IV contrast.

[Series 2: routine chest without · axial · non-contrast · 0.66mm/px · z∈[+1221,+1483]mm · 12 of 155 slices shown, 15 images]
[im 12/155  mediastinal]
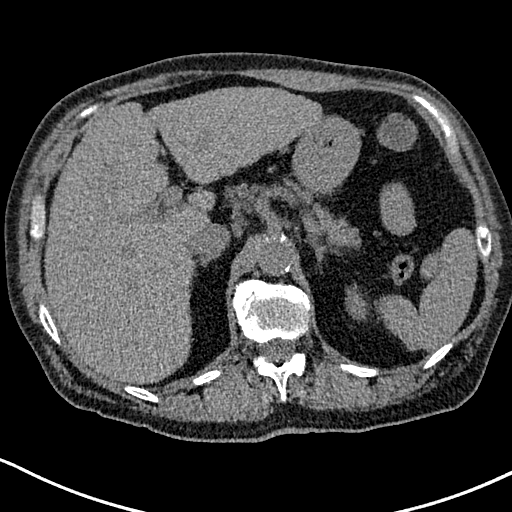
[im 12/155  lung]
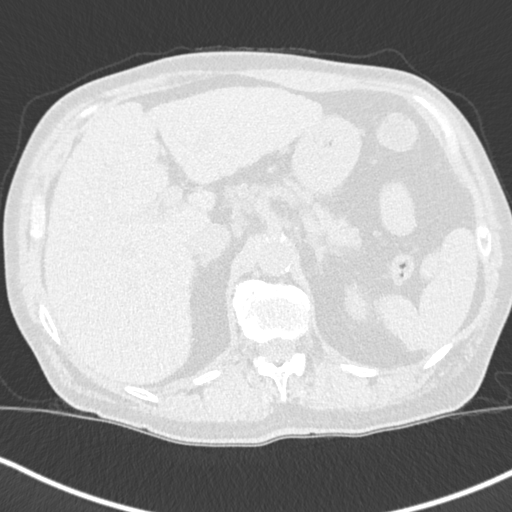
[im 24/155  lung]
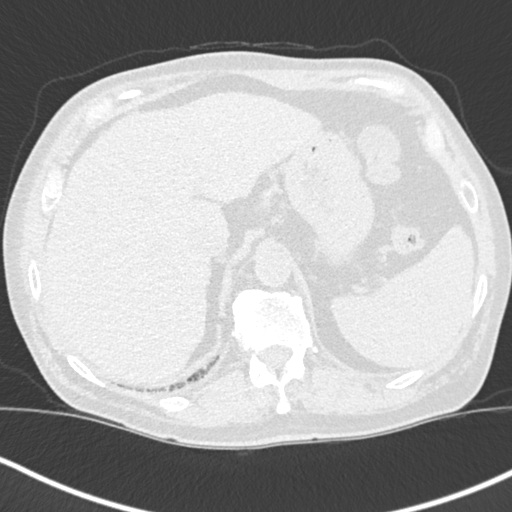
[im 36/155  lung]
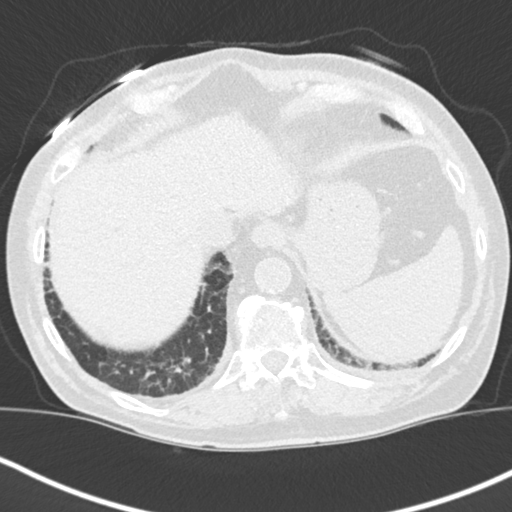
[im 48/155  lung]
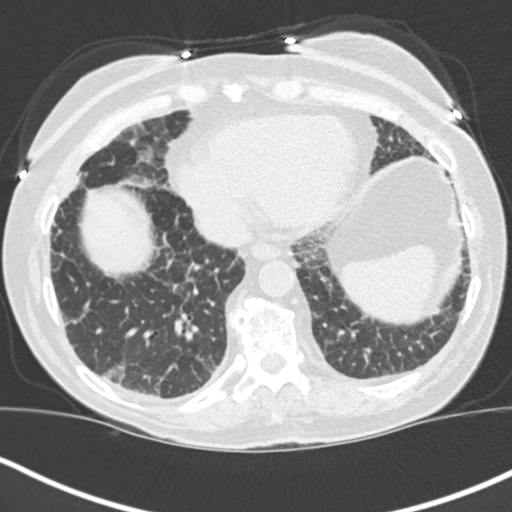
[im 60/155  mediastinal]
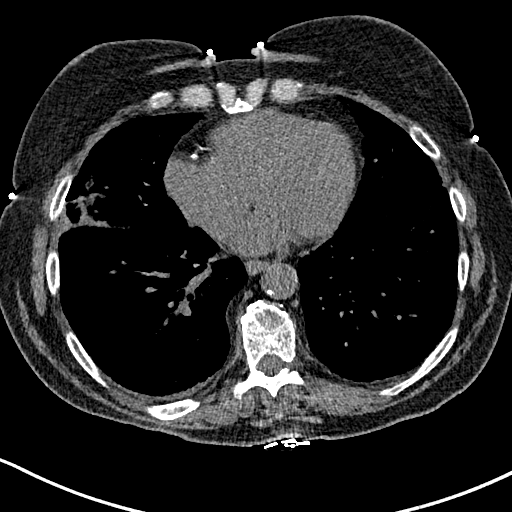
[im 60/155  lung]
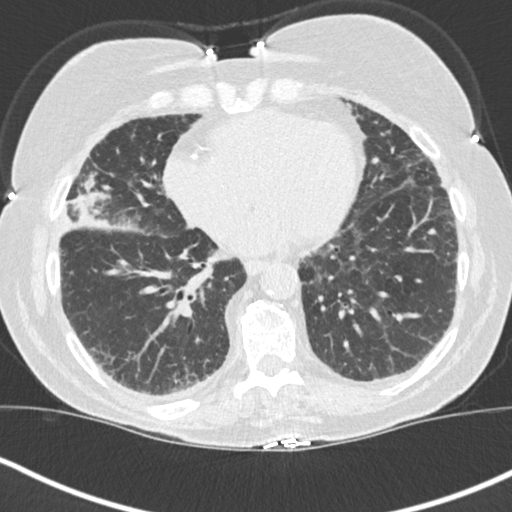
[im 72/155  lung]
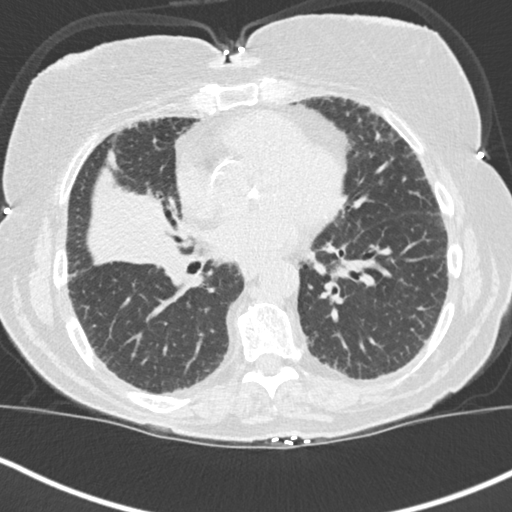
[im 83/155  lung]
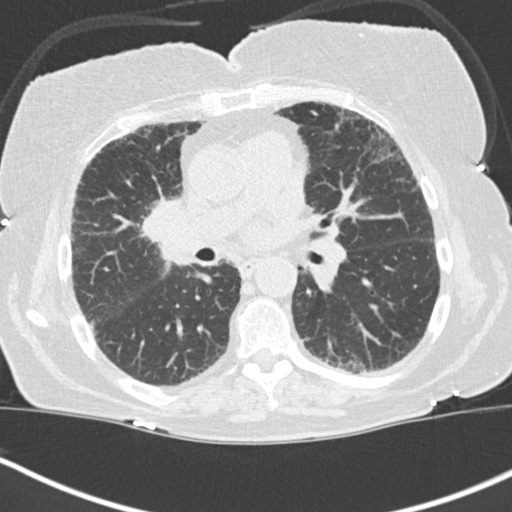
[im 95/155  lung]
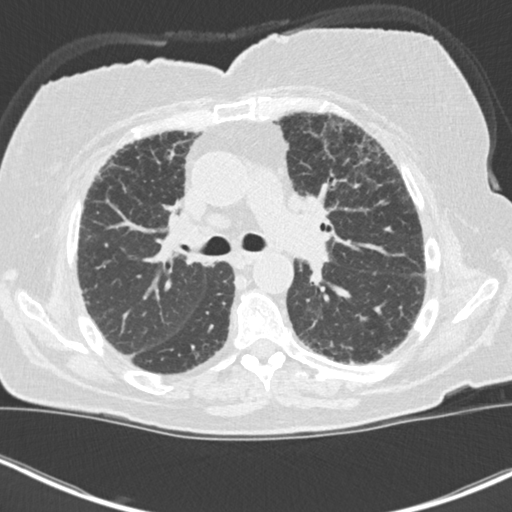
[im 107/155  mediastinal]
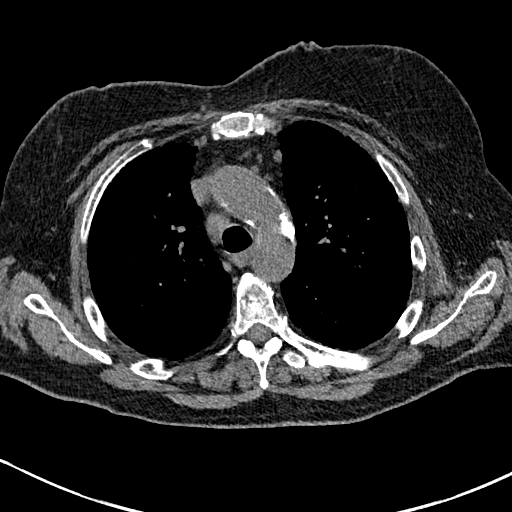
[im 107/155  lung]
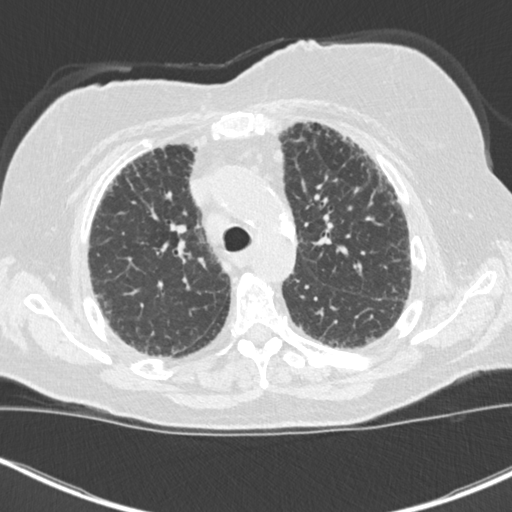
[im 119/155  lung]
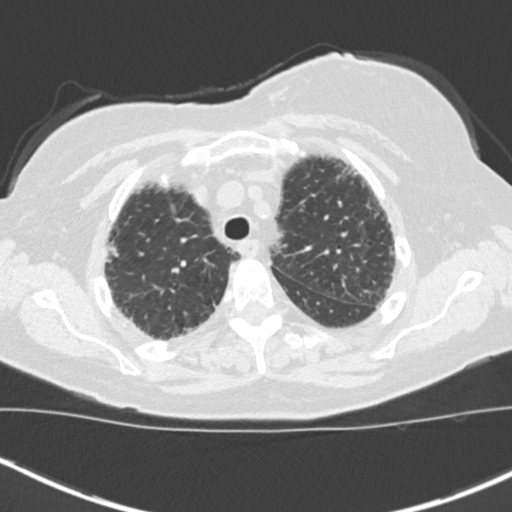
[im 131/155  lung]
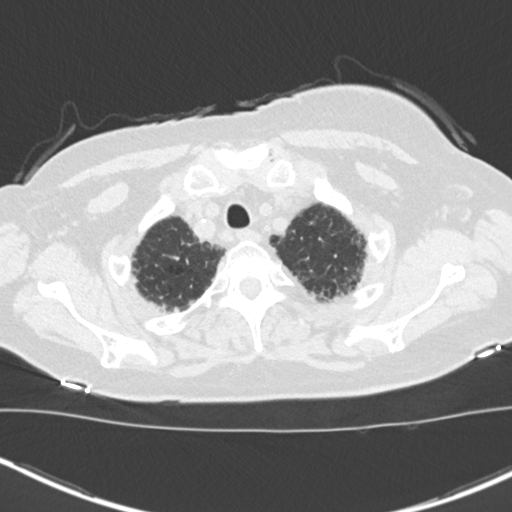
[im 143/155  lung]
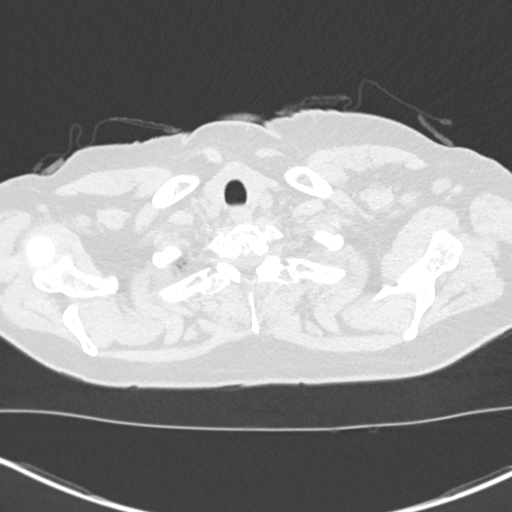

[Series 5: coronal · coronal · 0.61mm/px · 3 of 151 slices shown]
[im 31/151  lung]
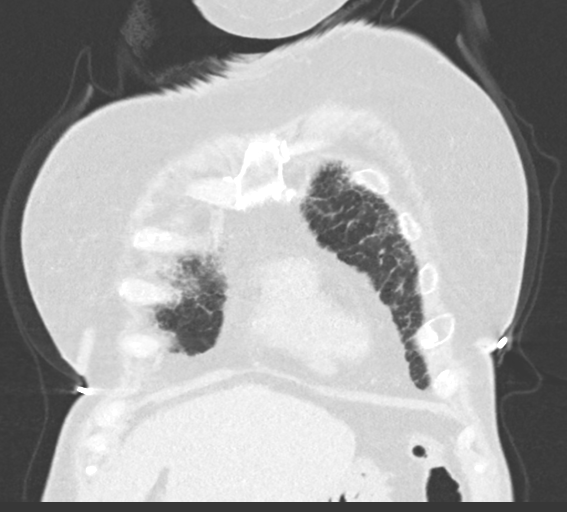
[im 61/151  lung]
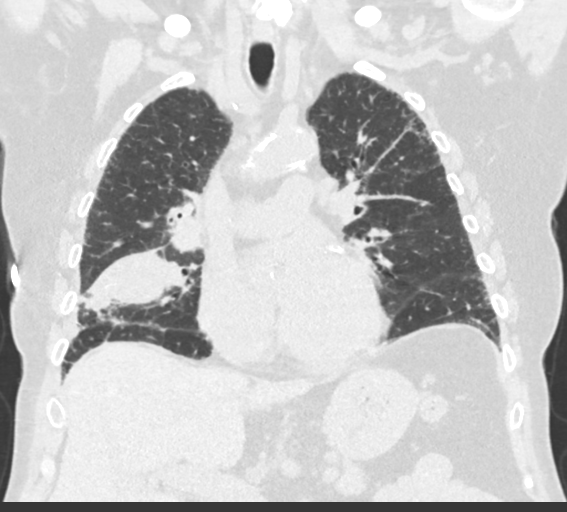
[im 91/151  lung]
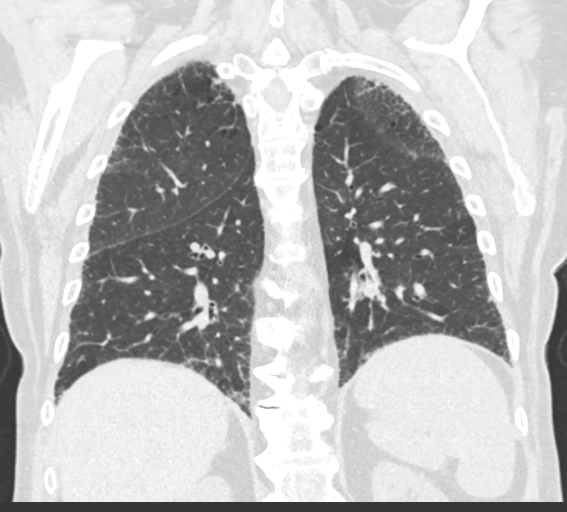

[15 of 36 positions shown; findings below may reference images not displayed]

FINDINGS: Cardiovascular: Heart size normal. Aortic atherosclerosis. Three
vessel coronary artery calcifications.

Mediastinum/Nodes: The trachea appears patent and is midline. Normal
appearance of the esophagus. No supraclavicular or axillary
adenopathy. The hilar structures are suboptimally evaluated due to
lack of IV contrast material. Within this limitation, persistent,
asymmetric enlargement of the right hilum concerning for adenopathy.

Lungs/Pleura: There is no pleural effusion. Mild paraseptal and
centrilobular emphysema. Similar appearance of diffusely increased
interstitial markings throughout both lungs. Peripheral predominant
interstitial reticulation is also noted which appears similar to
previous study. A mass like consolidation within the right middle
lobe measures 5.5 x 4.2 by 3.4 cm (volume = 41 cm^3), image 88/4.
Previously this measured 4.0 x 3.7 by 2.2 cm. (Volume = 17 cm^3)

Upper Abdomen: No acute abnormality noted within the upper abdomen.
The contour the liver appears nodular concerning for cirrhosis.
Normal appearance of the adrenal glands.

Musculoskeletal: Multi level spondylosis identified. No acute or
suspicious osseous abnormality.
IMPRESSION: 1. Interval increase in size of mass like consolidation within the
right middle lobe. Findings are worrisome for malignancy. Further
evaluation with PET-CT is advised.
2. Persistent, asymmetric enlargement of the right hilum concerning
for adenopathy.
3. Similar appearance of diffusely increased interstitial markings
throughout both lungs with peripheral predominant interstitial
reticulation. Findings are nonspecific and may reflect underlying
interstitial lung disease.
4. Three vessel coronary artery calcifications noted.
5. Morphologic features of the liver concerning for cirrhosis.
6. Emphysema and aortic atherosclerosis.

Aortic Atherosclerosis (05ICT-BWS.S) and Emphysema (05ICT-UWD.6).

## 2020-12-27 ENCOUNTER — Other Ambulatory Visit: Payer: Self-pay | Admitting: Physician Assistant

## 2020-12-27 DIAGNOSIS — T508X5D Adverse effect of diagnostic agents, subsequent encounter: Secondary | ICD-10-CM

## 2020-12-27 MED ORDER — PREDNISONE 50 MG PO TABS: ORAL_TABLET | ORAL | 0 refills | Status: DC

## 2020-12-31 ENCOUNTER — Ambulatory Visit (HOSPITAL_COMMUNITY)
Admission: RE | Admit: 2020-12-31 | Discharge: 2020-12-31 | Disposition: A | Discharge status: Home / Self Care | Payer: Medicare HMO | Source: Ambulatory Visit | Attending: Radiation Oncology | Admitting: Radiation Oncology

## 2020-12-31 ENCOUNTER — Other Ambulatory Visit: Payer: Self-pay

## 2020-12-31 DIAGNOSIS — C342 Malignant neoplasm of middle lobe, bronchus or lung: Secondary | ICD-10-CM | POA: Diagnosis not present

## 2020-12-31 DIAGNOSIS — I619 Nontraumatic intracerebral hemorrhage, unspecified: Secondary | ICD-10-CM | POA: Diagnosis not present

## 2020-12-31 DIAGNOSIS — C7931 Secondary malignant neoplasm of brain: Secondary | ICD-10-CM | POA: Diagnosis not present

## 2020-12-31 DIAGNOSIS — C3490 Malignant neoplasm of unspecified part of unspecified bronchus or lung: Secondary | ICD-10-CM | POA: Diagnosis not present

## 2020-12-31 MED ORDER — GADOBUTROL 1 MMOL/ML IV SOLN
6.0000 mL | Freq: Once | INTRAVENOUS | Status: AC | PRN
  Administered 2020-12-31: 6 mL via INTRAVENOUS

## 2021-01-01 NOTE — Progress Notes (Signed)
Radiation Oncology         [phone] ________________________________  Name: [name] MRN: [id number]  Date: 01/02/2021  DOB: 03-22-1951  Follow-Up Visit Note  CC: [name], MD  [name], MD    ICD-10-CM   1. Small cell lung cancer, right middle lobe (HCC)  C34.2   2. 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 Since her last visit, she was seen by Dr. [name] on 12/19/2020 and remains under observation with repeat chest CT scan in three months.  MRI of brain on 12/31/2020 showed stable chronic and non-specific white matter changes without new intracranial abnormality. There was also noted to be stable resolution of the suspected small left cerebellar metastasis. There was no metastatic disease identified.  On review of systems, she reports overall feeling well, the best she has even prior to knowing she had lung cancer. She denies headaches or visual problems.  She denies any dizziness or balance problems.  ALLERGIES:  is allergic to iohexol, tape, ciprofloxacin, latex, penicillins, and povidone-iodine.  Meds: Current Outpatient Medications  Medication Sig Dispense Refill  . Alpha-Lipoic Acid 200 MG CAPS Take by mouth.    . Probiotic Product (PROBIOTIC PO) Take 1 capsule by mouth daily.    Marland Kitchen albuterol (VENTOLIN HFA) 108 (90 Base) MCG/ACT inhaler Inhale 2 puffs into  the lungs every 4 (four) hours as needed for wheezing or shortness of breath. 1 each 3  . aspirin-acetaminophen-caffeine (EXCEDRIN MIGRAINE) 250-250-65 MG tablet Take 2 tablets by mouth daily as needed for headache. Up to 4 times a week    . b complex vitamins tablet Take 1 tablet by mouth daily. With Zinc and vitamin C    . Benzocaine (BOIL-EASE EX) Apply 1 application topically daily as needed (Boil).    . Biotin 5000 MCG TABS Take 10,000 mcg by mouth daily. Keratin    . carbamide peroxide (DEBROX) 6.5 % OTIC solution 5 drops daily as needed.    . Cholecalciferol (VITAMIN D3) 50 MCG (2000 UT) TABS Take 8,000 Units by mouth daily.    . colestipol (COLESTID) 1 g tablet Take 3 tablets (3 g total) by mouth daily. Do not take within two hours of other medications. 90 tablet 5  . Cyanocobalamin (VITAMIN B-12 PO) Take 5,000 mg by mouth. Vit B6 also    . diazepam (VALIUM) 5 MG tablet Take 5 mg by mouth daily as needed. 1/2 tab qhs    . diclofenac Sodium (VOLTAREN) 1 % GEL Apply topically 4 (four) times daily.    . Fluticasone-Umeclidin-Vilant (TRELEGY ELLIPTA) 100-62.5-25 MCG/INH AEPB Inhale 1 puff into the lungs daily. 28 each 5  . folic acid (FOLVITE) 1 MG tablet Take by mouth daily. 1,333 mcg QD    . furosemide (LASIX) 40 MG tablet Take 1 tablet (40 mg total) by mouth 2 (two) times daily. (Patient taking differently: Take 40 mg by mouth every  3 (three) days.) 30 tablet 1  . hydrocortisone cream 1 % Apply 1 application topically daily as needed for itching.    Marland Kitchen Hydrocortisone, Perianal, (PROCTO-PAK) 1 % CREA Apply anorectally twice daily for two weeks. 28 g 0  . levothyroxine (SYNTHROID) 112 MCG tablet Take 112 mcg by mouth daily.    . metFORMIN (GLUCOPHAGE) 500 MG tablet Take 1,000 mg by mouth 2 (two) times daily with a meal. Pt has been taking 2tabs BID.    . Multiple Vitamins-Minerals (MULTIVITAMIN WITH MINERALS) tablet Take 1 tablet by mouth daily. Adult 50+    . neomycin-bacitracin-polymyxin  (NEOSPORIN) OINT Apply 1 application topically daily as needed for irritation or wound care.    . omega-3 acid ethyl esters (LOVAZA) 1 g capsule Take 2 g by mouth 2 (two) times daily.    Marland Kitchen OVER THE COUNTER MEDICATION Apple Cider Vinegar 458m QD    . OVER THE COUNTER MEDICATION Code Liver Oil with Vit A and D3 QD    . oxyCODONE-acetaminophen (PERCOCET/ROXICET) 5-325 MG tablet Take 0.5 tablets by mouth 2 (two) times daily as needed for severe pain.    . pantoprazole (PROTONIX) 40 MG tablet Take 40 mg by mouth daily.    . pravastatin (PRAVACHOL) 40 MG tablet Take 40 mg by mouth daily.    . predniSONE (DELTASONE) 50 MG tablet Take 1 tablet 13 hours before your CT scan, 7 hours before your CT scan, and 1 hour before your CT scan 3 tablet 0  . pyridOXINE (VITAMIN B-6) 50 MG tablet Take 200 mg by mouth daily.    . TechLite Lancets MISC     . traZODone (DESYREL) 150 MG tablet Take 150 mg by mouth at bedtime. 1/2 tab qhs    . XARELTO 20 MG TABS tablet Take 20 mg by mouth daily.     No current facility-administered medications for this encounter.    Physical Findings: The patient is in no acute distress. Patient is alert and oriented.  height is 5' 6"  (1.676 m) and weight is 149 lb (67.6 kg). Her temporal temperature is 96.7 F (35.9 C) (abnormal). Her blood pressure is 137/63 and her pulse is 79. Her respiration is 18 and oxygen saturation is 97%.  Lungs are clear to auscultation bilaterally. Heart has regular rate and rhythm. No palpable cervical, supraclavicular, or axillary adenopathy. Abdomen soft, non-tender, normal bowel sounds.  The neurological examination is non-focal.   Lab Findings: Lab Results  Component Value Date   WBC CANCELED 12/19/2020   WBC 4.6 12/19/2020   HGB CANCELED 12/19/2020   HGB 10.1 (L) 12/19/2020   HCT CANCELED 12/19/2020   HCT 31.7 (L) 12/19/2020   MCV 100.3 (H) 12/19/2020   PLT CANCELED 12/19/2020   PLT 186 12/19/2020    Radiographic Findings: MR Brain W Wo  Contrast  Result Date: 01/01/2021 CLINICAL DATA:  70year old female with small cell lung cancer, has not yet received radiation to the brain but after chemotherapy a solitary 4 mm cerebellar metastasis was no longer visible in December. Has declined whole brain radiation to this point. Restaging. EXAM: MRI HEAD WITHOUT AND WITH CONTRAST TECHNIQUE: Multiplanar, multiecho pulse sequences of the brain and surrounding structures were obtained without and with intravenous contrast. CONTRAST:  668mGADAVIST GADOBUTROL 1 MMOL/ML IV SOLN COMPARISON:  Brain MRI 09/21/2020 and earlier. FINDINGS: Brain: Punctate enhancement of the left cerebellum near midline seen in August of last year remains resolved (series 16, image 39). No abnormal enhancement today. No midline shift,  mass effect, or evidence of intracranial mass lesion. No dural thickening. No restricted diffusion to suggest acute infarction. No ventriculomegaly, extra-axial collection or acute intracranial hemorrhage. Cervicomedullary junction and pituitary are within normal limits. Widely scattered nonspecific white matter T2 and FLAIR hyperintensity in both hemispheres appears stable since August. Similar patchy T2 heterogeneity in the pons is stable. Punctate microhemorrhage in the area of the resolved left vermis enhancement is stable. No other chronic cerebral blood products. No cortical encephalomalacia identified. Vascular: Major intracranial vascular flow voids are stable. The major dural venous sinuses are enhancing and appear to be patent. Skull and upper cervical spine: Visualized bone marrow signal is within normal limits. Negative visible cervical spine and spinal cord. Sinuses/Orbits: Stable and negative. Other: Trace mastoid fluid is stable. Visible internal auditory structures appear normal. Visible scalp and face appear negative. IMPRESSION: 1. No metastatic disease identified. Stable resolution of the suspected small left cerebellar metastasis. 2.  Stable chronic and nonspecific white matter changes. No new Intracranial abnormality. Electronically Signed   By: Genevie Ann M.D.   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  No evidence of recurrence on clinical exam today. Recent MRI of brain showed resolution of the suspected small left cerebellar metastasis without new metastatic disease.  Patient did not receive cranial irradiation but her suspected small cerebellar metastasis cleared after chemotherapy.  She did not wish to consider prophylactic cranial radiation.   Plan: The patient is scheduled to follow up with Dr. [name] on 03/22/2021. She will follow up with radiation oncology in 4 months.  Prior to this follow-up the patient will have an MRI of the brain.  Total time spent in this encounter was 15 minutes which included reviewing the patient's most recent follow-up with Dr. [name], MRI of brain, physical examination, and documentation.  ____________________________________   Blair Promise, PhD, MD  This document serves as a record of services personally performed by [name], MD. It was created on his behalf by Clerance Lav, a trained medical scribe. The creation of this record is based on the scribe's personal observations and the provider's statements to them. This document has been checked and approved by the attending provider.

## 2021-01-02 ENCOUNTER — Other Ambulatory Visit: Payer: Self-pay

## 2021-01-02 ENCOUNTER — Encounter: Payer: Self-pay | Admitting: Radiation Oncology

## 2021-01-02 ENCOUNTER — Ambulatory Visit
Admission: RE | Admit: 2021-01-02 | Discharge: 2021-01-02 | Disposition: A | Discharge status: Home / Self Care | Payer: Medicare HMO | Source: Ambulatory Visit | Attending: Radiation Oncology | Admitting: Radiation Oncology

## 2021-01-02 DIAGNOSIS — C3490 Malignant neoplasm of unspecified part of unspecified bronchus or lung: Secondary | ICD-10-CM

## 2021-01-02 DIAGNOSIS — C771 Secondary and unspecified malignant neoplasm of intrathoracic lymph nodes: Secondary | ICD-10-CM | POA: Insufficient documentation

## 2021-01-02 DIAGNOSIS — Z923 Personal history of irradiation: Secondary | ICD-10-CM | POA: Insufficient documentation

## 2021-01-02 DIAGNOSIS — Z79899 Other long term (current) drug therapy: Secondary | ICD-10-CM | POA: Insufficient documentation

## 2021-01-02 DIAGNOSIS — C7931 Secondary malignant neoplasm of brain: Secondary | ICD-10-CM | POA: Insufficient documentation

## 2021-01-02 DIAGNOSIS — C342 Malignant neoplasm of middle lobe, bronchus or lung: Secondary | ICD-10-CM | POA: Diagnosis not present

## 2021-01-02 DIAGNOSIS — Z7982 Long term (current) use of aspirin: Secondary | ICD-10-CM | POA: Insufficient documentation

## 2021-01-02 DIAGNOSIS — Z7901 Long term (current) use of anticoagulants: Secondary | ICD-10-CM | POA: Insufficient documentation

## 2021-01-02 DIAGNOSIS — Z7984 Long term (current) use of oral hypoglycemic drugs: Secondary | ICD-10-CM | POA: Diagnosis not present

## 2021-01-02 DIAGNOSIS — Z08 Encounter for follow-up examination after completed treatment for malignant neoplasm: Secondary | ICD-10-CM | POA: Diagnosis not present

## 2021-01-02 NOTE — Progress Notes (Addendum)
Patient is here today for follow up to radiation that was completed October 2021.  She reports pain in tailbone area. Denies shortness of breath. Cough has resolved. Reports appetite is good. Denies fatigue. Patient denies any swallowing difficulty or choking concerns.  Vitals:   01/02/21 1114  BP: 137/63  Pulse: 79  Resp: 18  Temp: (!) 96.7 F (35.9 C)  TempSrc: Temporal  SpO2: 97%  Weight: 67.6 kg  Height: 5' 6"  (1.676 m)

## 2021-01-03 DIAGNOSIS — F411 Generalized anxiety disorder: Secondary | ICD-10-CM | POA: Diagnosis not present

## 2021-01-03 DIAGNOSIS — F339 Major depressive disorder, recurrent, unspecified: Secondary | ICD-10-CM | POA: Diagnosis not present

## 2021-01-11 DIAGNOSIS — I10 Essential (primary) hypertension: Secondary | ICD-10-CM | POA: Diagnosis not present

## 2021-01-11 DIAGNOSIS — E1165 Type 2 diabetes mellitus with hyperglycemia: Secondary | ICD-10-CM | POA: Diagnosis not present

## 2021-01-11 DIAGNOSIS — K922 Gastrointestinal hemorrhage, unspecified: Secondary | ICD-10-CM | POA: Diagnosis not present

## 2021-01-11 DIAGNOSIS — D509 Iron deficiency anemia, unspecified: Secondary | ICD-10-CM | POA: Diagnosis not present

## 2021-01-15 IMAGING — PT NM PET TUM IMG INITIAL (PI) SKULL BASE T - THIGH
1 series · 4 of 4 positions shown · non-contrast
Comparison: CT chest 04/12/2020 and 12/19/2019.

CLINICAL DATA: Initial treatment strategy for lung nodule.

EXAM:
NUCLEAR MEDICINE PET SKULL BASE TO THIGH
TECHNIQUE: 7.2 mCi F-18 FDG was injected intravenously. Full-ring PET imaging
was performed from the skull base to thigh after the radiotracer. CT
data was obtained and used for attenuation correction and anatomic
localization.
Fasting blood glucose: 128 mg/dl

[Series 1091: results mm oncology reading · 1.0mm · 0.89mm/px · 4 of 4 slices shown]
[im 1/4]
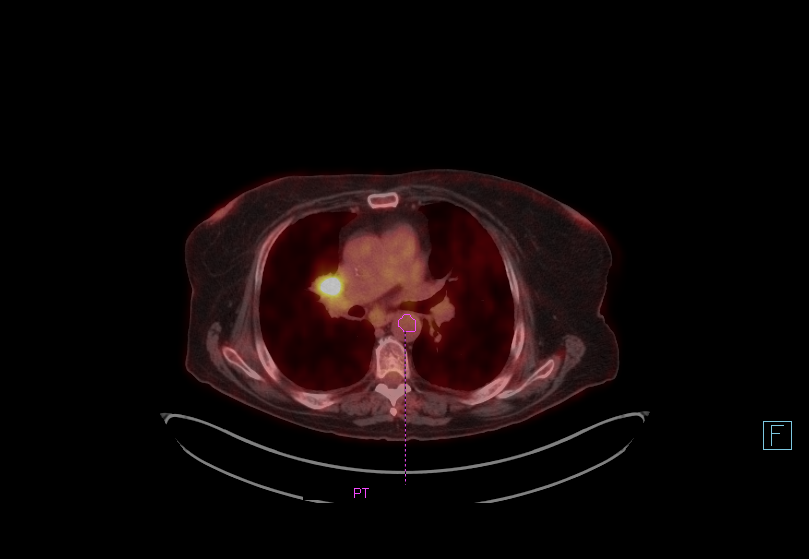
[im 2/4]
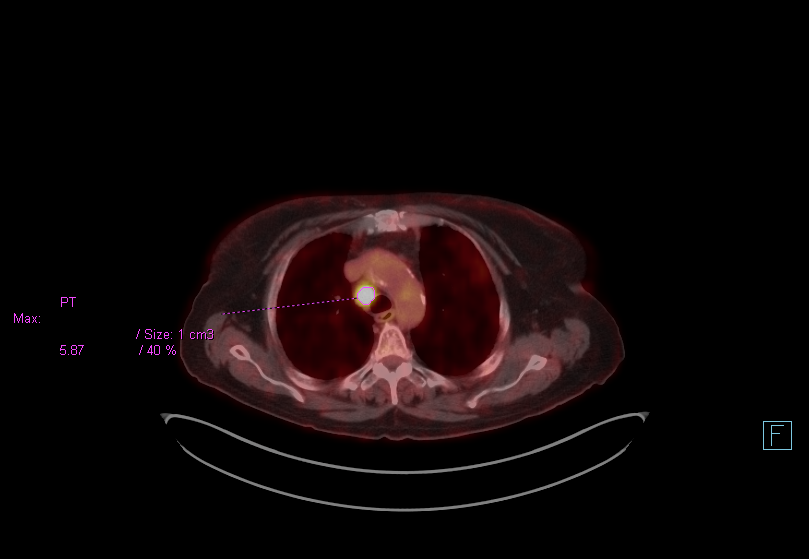
[im 3/4]
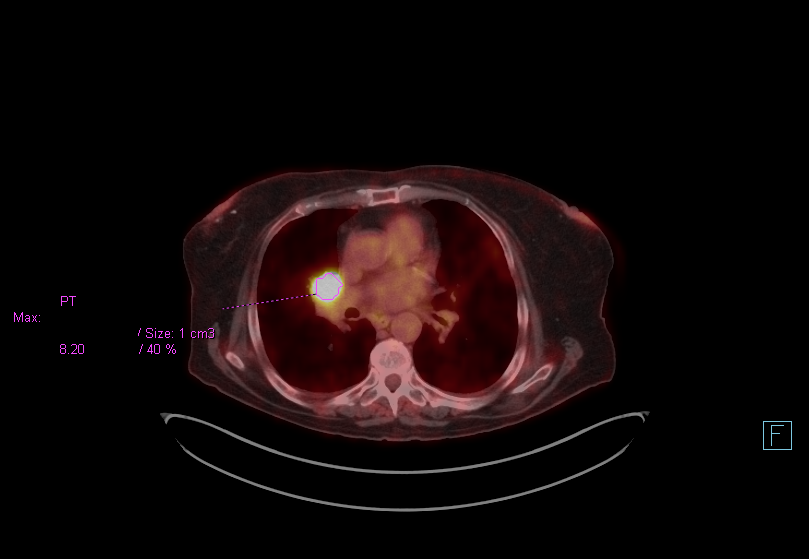
[im 4/4]
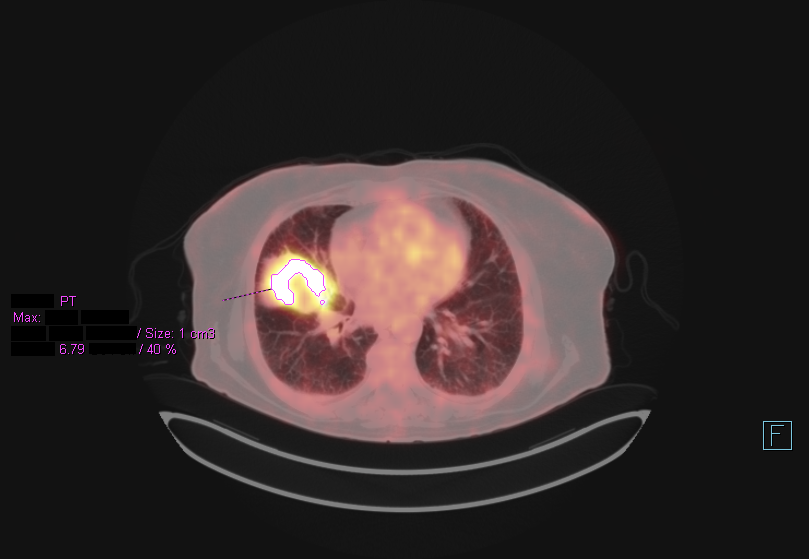

[4 of 4 positions shown; findings below may reference images not displayed]

FINDINGS: Mediastinal blood pool activity: SUV max

Liver activity: SUV max NA

NECK:

No abnormal hypermetabolism.

Incidental CT findings:

None.

CHEST:

Mild hypermetabolism the thyroid. Hypermetabolic low right
paratracheal lymph node measures 1.7 cm with an SUV max of 14.7.
Hypermetabolic right hilar lymph node is difficult to measure on CT
but has an SUV max of 20.5. Lateral segment right middle lobe mass
measures approximately 4.6 x 5.8 cm with an SUV max of 17.0. New
small area of consolidation in the medial right lower lobe (4/88) is
mildly hypermetabolic and likely infectious or inflammatory given
its relative absence on 04/12/2020.

Incidental CT findings:

Atherosclerotic calcification of the aorta, aortic valve and
coronary arteries. Heart is at the upper limits of normal in size to
mildly enlarged. No pericardial effusion. Trace bilateral pleural
effusions. Centrilobular and paraseptal emphysema.

ABDOMEN/PELVIS:

No abnormal hypermetabolism in the liver, adrenal glands, spleen or
pancreas. No hypermetabolic lymph nodes.

Incidental CT findings:

Liver margin is irregular. Cholecystectomy. Adrenal glands, kidneys,
spleen, pancreas stomach and bowel are grossly unremarkable.
Atherosclerotic calcification of the aorta without aneurysm.

SKELETON:

No abnormal hypermetabolism.

Incidental CT findings:

Degenerative changes in the spine. No worrisome lytic or sclerotic
lesions.
IMPRESSION: 1. Hypermetabolic right middle lobe mass with hypermetabolic
metastatic right hilar and low right paratracheal adenopathy,
findings most consistent with primary bronchogenic carcinoma (T3 N2
M0 or stage IIIB disease).
2. Trace bilateral pleural effusions.
3. Cirrhosis.
4. Mildly hypermetabolic thyroid.  Consider laboratory correlation.
5. Aortic atherosclerosis ([id number]). Coronary artery
calcification.
6.  Emphysema ([id number]).

## 2021-01-17 DIAGNOSIS — F411 Generalized anxiety disorder: Secondary | ICD-10-CM | POA: Diagnosis not present

## 2021-01-17 DIAGNOSIS — F339 Major depressive disorder, recurrent, unspecified: Secondary | ICD-10-CM | POA: Diagnosis not present

## 2021-01-23 DIAGNOSIS — L905 Scar conditions and fibrosis of skin: Secondary | ICD-10-CM | POA: Diagnosis not present

## 2021-01-23 DIAGNOSIS — X32XXXD Exposure to sunlight, subsequent encounter: Secondary | ICD-10-CM | POA: Diagnosis not present

## 2021-01-23 DIAGNOSIS — L570 Actinic keratosis: Secondary | ICD-10-CM | POA: Diagnosis not present

## 2021-02-08 DIAGNOSIS — R200 Anesthesia of skin: Secondary | ICD-10-CM | POA: Diagnosis not present

## 2021-02-08 DIAGNOSIS — M6530 Trigger finger, unspecified finger: Secondary | ICD-10-CM | POA: Diagnosis not present

## 2021-02-08 DIAGNOSIS — M65332 Trigger finger, left middle finger: Secondary | ICD-10-CM | POA: Diagnosis not present

## 2021-02-08 DIAGNOSIS — M79642 Pain in left hand: Secondary | ICD-10-CM | POA: Diagnosis not present

## 2021-02-08 DIAGNOSIS — M79601 Pain in right arm: Secondary | ICD-10-CM | POA: Diagnosis not present

## 2021-02-12 DIAGNOSIS — N1832 Chronic kidney disease, stage 3b: Secondary | ICD-10-CM | POA: Diagnosis not present

## 2021-02-12 DIAGNOSIS — J449 Chronic obstructive pulmonary disease, unspecified: Secondary | ICD-10-CM | POA: Diagnosis not present

## 2021-02-12 DIAGNOSIS — E782 Mixed hyperlipidemia: Secondary | ICD-10-CM | POA: Diagnosis not present

## 2021-02-12 DIAGNOSIS — R799 Abnormal finding of blood chemistry, unspecified: Secondary | ICD-10-CM | POA: Diagnosis not present

## 2021-02-12 DIAGNOSIS — E039 Hypothyroidism, unspecified: Secondary | ICD-10-CM | POA: Diagnosis not present

## 2021-02-12 DIAGNOSIS — I10 Essential (primary) hypertension: Secondary | ICD-10-CM | POA: Diagnosis not present

## 2021-02-12 DIAGNOSIS — E1165 Type 2 diabetes mellitus with hyperglycemia: Secondary | ICD-10-CM | POA: Diagnosis not present

## 2021-02-12 DIAGNOSIS — K7460 Unspecified cirrhosis of liver: Secondary | ICD-10-CM | POA: Diagnosis not present

## 2021-02-12 DIAGNOSIS — K219 Gastro-esophageal reflux disease without esophagitis: Secondary | ICD-10-CM | POA: Diagnosis not present

## 2021-02-14 DIAGNOSIS — M533 Sacrococcygeal disorders, not elsewhere classified: Secondary | ICD-10-CM | POA: Diagnosis not present

## 2021-02-15 DIAGNOSIS — E039 Hypothyroidism, unspecified: Secondary | ICD-10-CM | POA: Diagnosis not present

## 2021-02-15 DIAGNOSIS — D519 Vitamin B12 deficiency anemia, unspecified: Secondary | ICD-10-CM | POA: Diagnosis not present

## 2021-02-15 DIAGNOSIS — D509 Iron deficiency anemia, unspecified: Secondary | ICD-10-CM | POA: Diagnosis not present

## 2021-02-15 DIAGNOSIS — E1165 Type 2 diabetes mellitus with hyperglycemia: Secondary | ICD-10-CM | POA: Diagnosis not present

## 2021-02-15 DIAGNOSIS — Z79899 Other long term (current) drug therapy: Secondary | ICD-10-CM | POA: Diagnosis not present

## 2021-02-15 DIAGNOSIS — G894 Chronic pain syndrome: Secondary | ICD-10-CM | POA: Diagnosis not present

## 2021-02-15 DIAGNOSIS — N1832 Chronic kidney disease, stage 3b: Secondary | ICD-10-CM | POA: Diagnosis not present

## 2021-02-15 DIAGNOSIS — I10 Essential (primary) hypertension: Secondary | ICD-10-CM | POA: Diagnosis not present

## 2021-02-15 DIAGNOSIS — G4700 Insomnia, unspecified: Secondary | ICD-10-CM | POA: Diagnosis not present

## 2021-02-15 DIAGNOSIS — E785 Hyperlipidemia, unspecified: Secondary | ICD-10-CM | POA: Diagnosis not present

## 2021-02-16 DIAGNOSIS — X32XXXD Exposure to sunlight, subsequent encounter: Secondary | ICD-10-CM | POA: Diagnosis not present

## 2021-02-16 DIAGNOSIS — L570 Actinic keratosis: Secondary | ICD-10-CM | POA: Diagnosis not present

## 2021-02-20 DIAGNOSIS — J449 Chronic obstructive pulmonary disease, unspecified: Secondary | ICD-10-CM | POA: Diagnosis not present

## 2021-02-20 DIAGNOSIS — I10 Essential (primary) hypertension: Secondary | ICD-10-CM | POA: Diagnosis not present

## 2021-02-20 DIAGNOSIS — K219 Gastro-esophageal reflux disease without esophagitis: Secondary | ICD-10-CM | POA: Diagnosis not present

## 2021-02-20 DIAGNOSIS — R799 Abnormal finding of blood chemistry, unspecified: Secondary | ICD-10-CM | POA: Diagnosis not present

## 2021-02-20 DIAGNOSIS — E039 Hypothyroidism, unspecified: Secondary | ICD-10-CM | POA: Diagnosis not present

## 2021-02-20 DIAGNOSIS — E1165 Type 2 diabetes mellitus with hyperglycemia: Secondary | ICD-10-CM | POA: Diagnosis not present

## 2021-02-20 DIAGNOSIS — N1832 Chronic kidney disease, stage 3b: Secondary | ICD-10-CM | POA: Diagnosis not present

## 2021-02-20 DIAGNOSIS — E782 Mixed hyperlipidemia: Secondary | ICD-10-CM | POA: Diagnosis not present

## 2021-02-20 DIAGNOSIS — K7460 Unspecified cirrhosis of liver: Secondary | ICD-10-CM | POA: Diagnosis not present

## 2021-02-21 IMAGING — MR MR HEAD WO/W CM
14 series · 48 of 48 positions shown · IV contrast (gadavist)
Comparison: 09/05/2014 brain MRI

CLINICAL DATA: Small cell lung cancer staging

EXAM:
MRI HEAD WITHOUT AND WITH CONTRAST
TECHNIQUE: Multiplanar, multiecho pulse sequences of the brain and surrounding
structures were obtained without and with intravenous contrast.
CONTRAST:  7mL GADAVIST GADOBUTROL 1 MMOL/ML IV SOLN

[Series 5: DWI · axial · 3.0mm · 1.31mm/px · z∈[-39,+109]mm · 7 of 104 slices shown (1 of 4)]
[im 1/104]
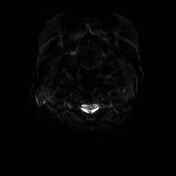
[im 18/104]
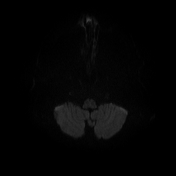
[im 35/104]
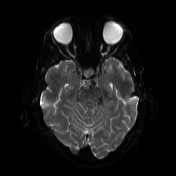
[im 52/104]
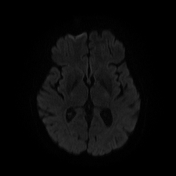
[im 69/104]
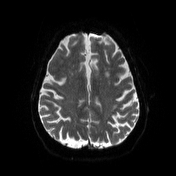
[im 86/104]
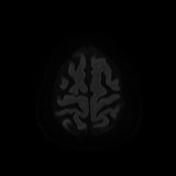
[im 104/104]
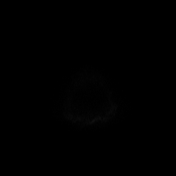

[Series 6: DWI · axial · 3.0mm · 1.31mm/px · z∈[-39,+109]mm · 3 of 52 slices shown (2 of 4)]
[im 1/52]
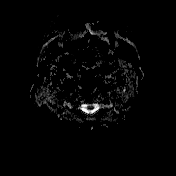
[im 26/52]
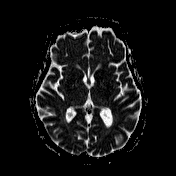
[im 52/52]
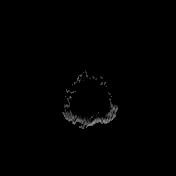

[Series 7: T1 · sagittal · 5.0mm · 0.75mm/px · 1 of 25 slices shown (1 of 2)]
[im 1/25]
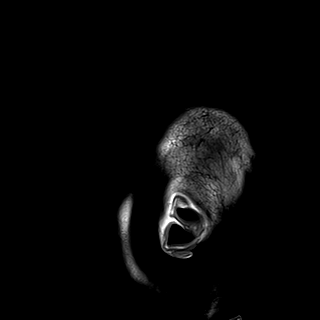

[Series 8: T2 · axial · 5.0mm · 0.57mm/px · 1 of 25 slices shown]
[im 1/25]
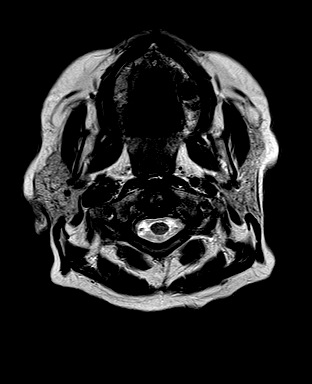

[Series 9: mip_images(sw) · axial · 32.0mm · 0.69mm/px · z∈[-33,+91]mm · 2 of 33 slices shown]
[im 1/33]
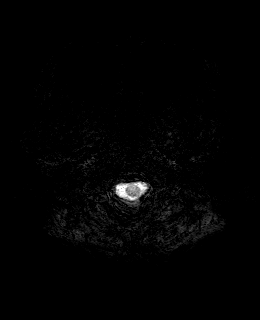
[im 33/33]
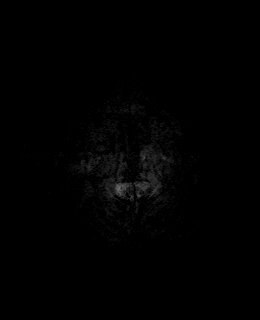

[Series 10: swi_images · axial · 4.0mm · 0.69mm/px · z∈[-46,+105]mm · 2 of 40 slices shown]
[im 1/40]
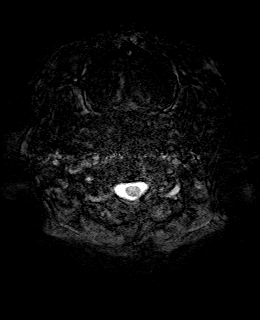
[im 40/40]
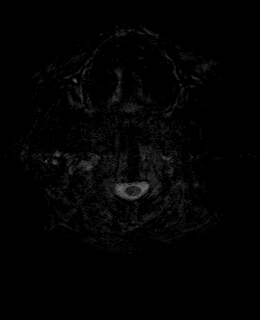

[Series 11: FLAIR · axial · 3.0mm · 0.69mm/px · z∈[-46,+105]mm · 3 of 53 slices shown]
[im 1/53]
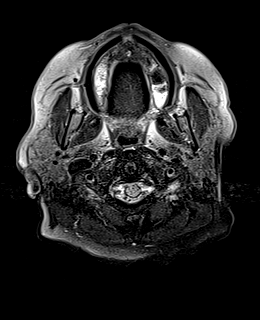
[im 27/53]
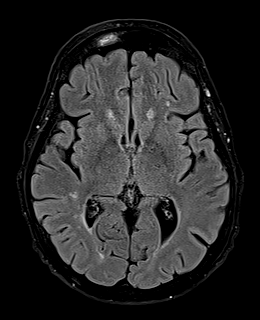
[im 53/53]
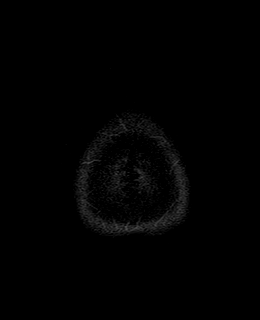

[Series 12: T1 · axial · 1.0mm · 0.86mm/px · z∈[-47,+107]mm · 9 of 160 slices shown (2 of 2)]
[im 1/160]
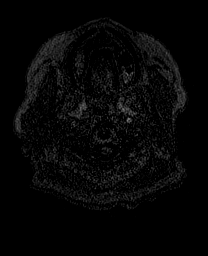
[im 20/160]
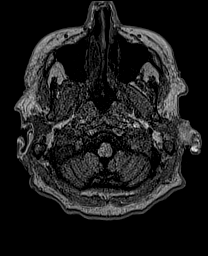
[im 40/160]
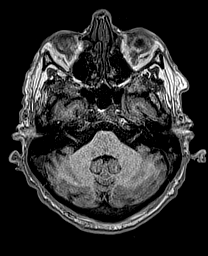
[im 60/160]
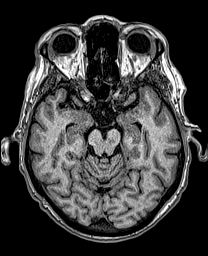
[im 80/160]
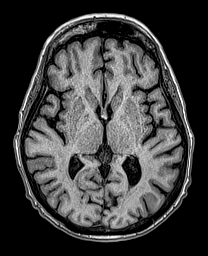
[im 100/160]
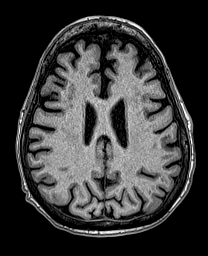
[im 120/160]
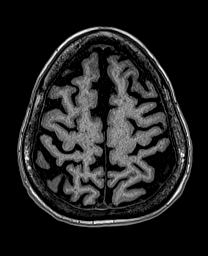
[im 140/160]
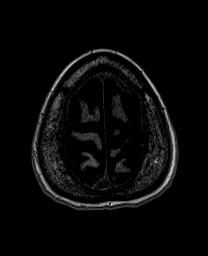
[im 160/160]
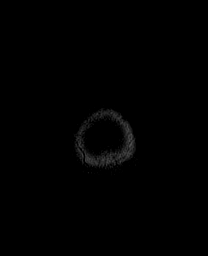

[Series 13: DWI · coronal · 5.0mm · 1.25mm/px · 4 of 76 slices shown (3 of 4)]
[im 1/76]
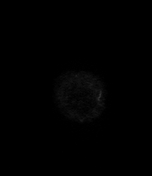
[im 26/76]
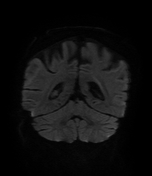
[im 51/76]
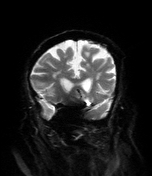
[im 76/76]
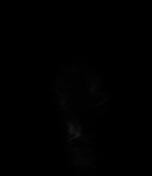

[Series 14: DWI · coronal · 5.0mm · 1.25mm/px · 2 of 38 slices shown (4 of 4)]
[im 1/38]
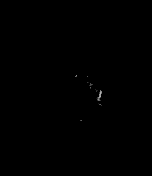
[im 38/38]
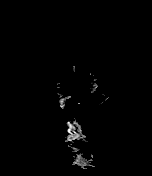

[Series 15: T2 post-contrast · coronal · 5.0mm · 0.57mm/px · 2 of 29 slices shown]
[im 1/29]
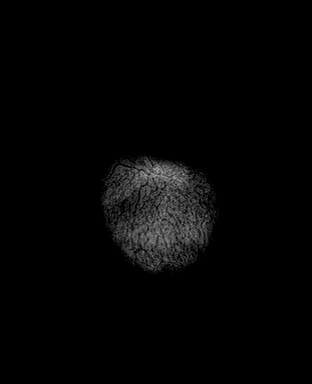
[im 29/29]
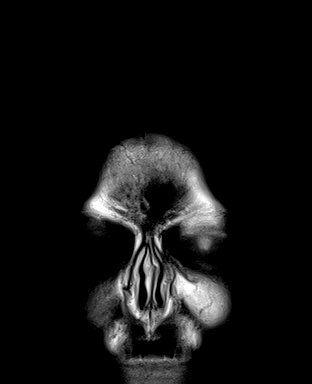

[Series 16: T1 post-contrast · axial · 1.0mm · 0.86mm/px · z∈[-47,+107]mm · 9 of 160 slices shown (1 of 3)]
[im 1/160]
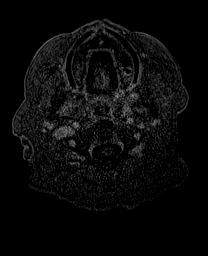
[im 20/160]
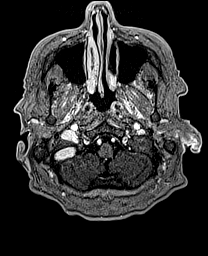
[im 40/160]
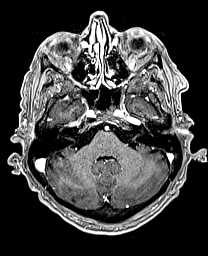
[im 60/160]
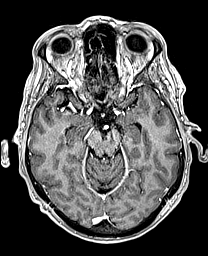
[im 80/160]
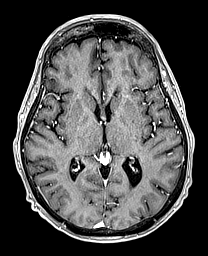
[im 100/160]
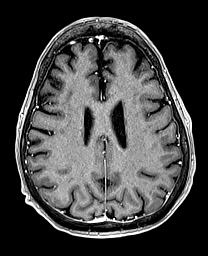
[im 120/160]
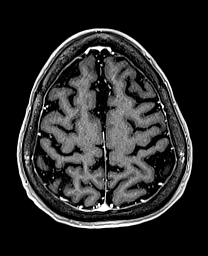
[im 140/160]
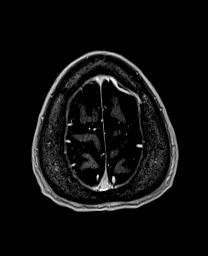
[im 160/160]
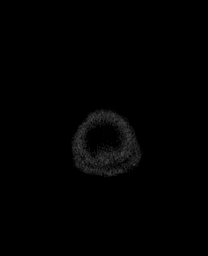

[Series 17: T1 post-contrast · coronal · 5.0mm · 0.43mm/px · 2 of 29 slices shown (2 of 3)]
[im 1/29]
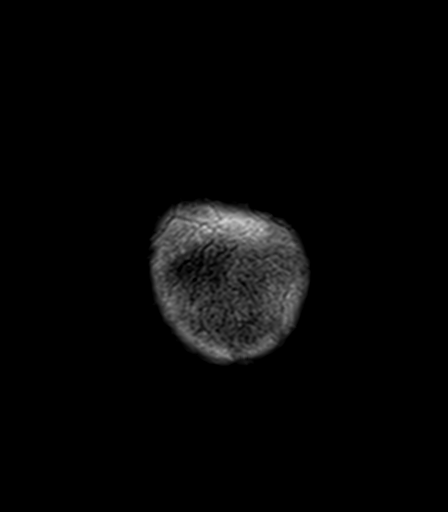
[im 29/29]
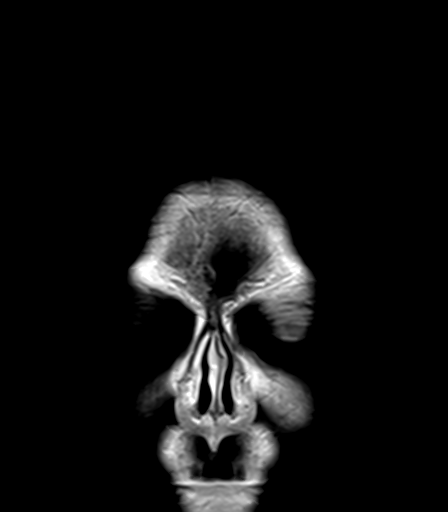

[Series 18: T1 post-contrast · sagittal · 5.0mm · 0.75mm/px · 1 of 25 slices shown (3 of 3)]
[im 1/25]
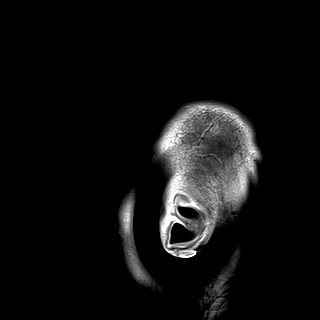

[48 of 48 positions shown; findings below may reference images not displayed]

FINDINGS: Brain: Enhancing nodular lesion in the midline cerebellum measuring
4 mm on coronal postcontrast imaging, with mild adjacent vasogenic
edema.

Subtle nodular densities marked on axial slices more laterally in
the cerebellum are not seen on coronal acquisition and attributed to
artifact.

No incidental infarct, hemorrhage, hydrocephalus, or collection.
Mild chronic small vessel disease in the hemispheric white matter.

Vascular: Normal flow voids and vascular enhancement

Skull and upper cervical spine: No evidence of bony metastasis

Sinuses/Orbits: Negative for mass.
IMPRESSION: Solitary 4 mm metastasis in the midline cerebellum with mild
vasogenic edema.

## 2021-02-21 IMAGING — CT CT CTA ABD/PEL W/CM AND/OR W/O CM
3 of 9 series · 11 of 46 positions shown, 17 images · IV contrast (Omnipaque or Isovue)
Comparison: 05/02/2020 and previous

CLINICAL DATA: Mesenteric ischemia, weight loss, abdominal pain.
History of small cell lung carcinoma post chemotherapy.

EXAM:
CTA ABDOMEN AND PELVIS WITH CONTRAST
TECHNIQUE: Multidetector CT imaging of the abdomen and pelvis was performed
using the standard protocol during bolus administration of
intravenous contrast. Multiplanar reconstructed images and MIPs were
obtained and reviewed to evaluate the vascular anatomy.
CONTRAST:  80mL OMNIPAQUE IOHEXOL 350 MG/ML SOLN

[Series 4: arterial · axial · arterial · 0.71mm/px · z∈[+891,+999]mm · 3 of 244 slices shown]
[im 28/244  soft-tissue]
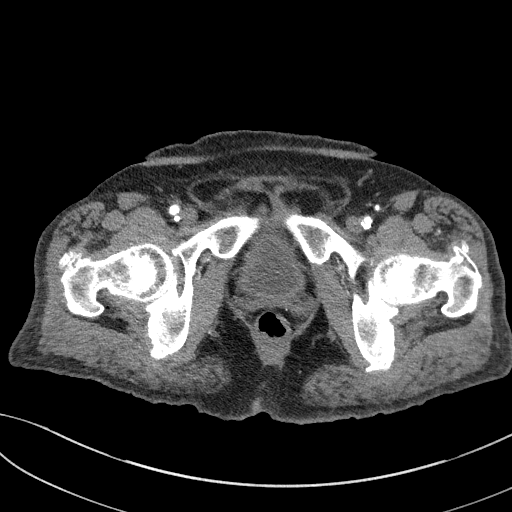
[im 55/244  soft-tissue]
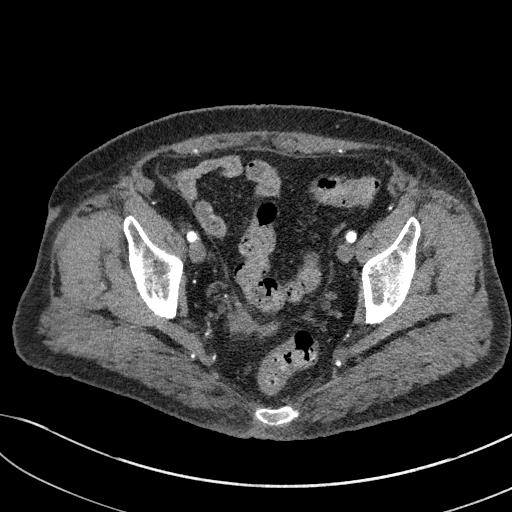
[im 82/244  soft-tissue]
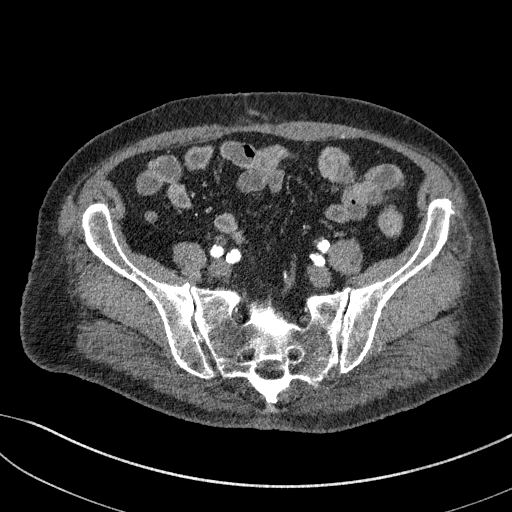

[Series 7: cor art · coronal · 0.81mm/px · 2 of 158 slices shown, 3 images]
[im 53/158  soft-tissue]
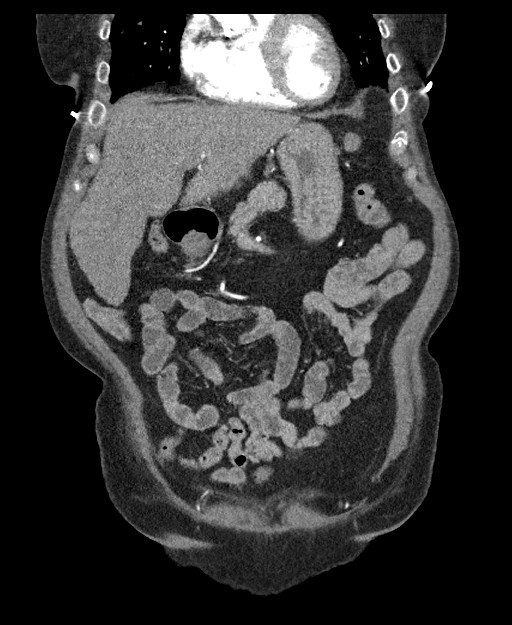
[im 53/158  bone]
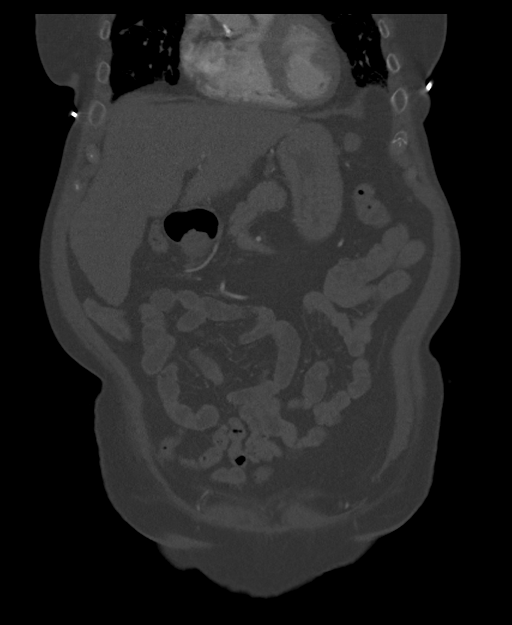
[im 105/158  soft-tissue]
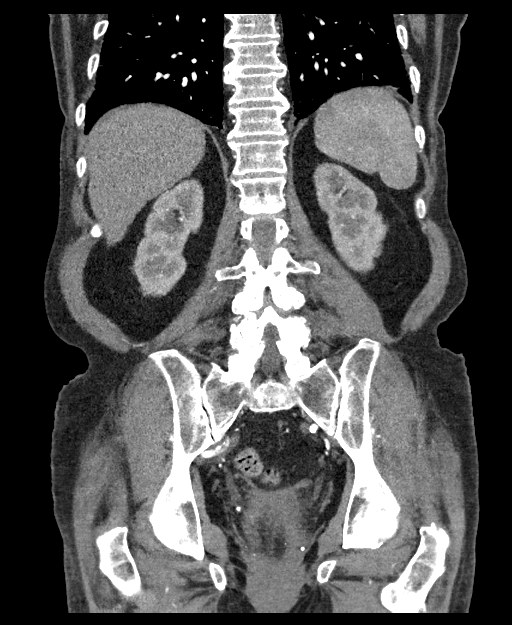

[Series 11: portal venous · axial · portal-venous · 0.71mm/px · z∈[+910,+1250]mm · 6 of 96 slices shown, 11 images]
[im 14/96  soft-tissue]
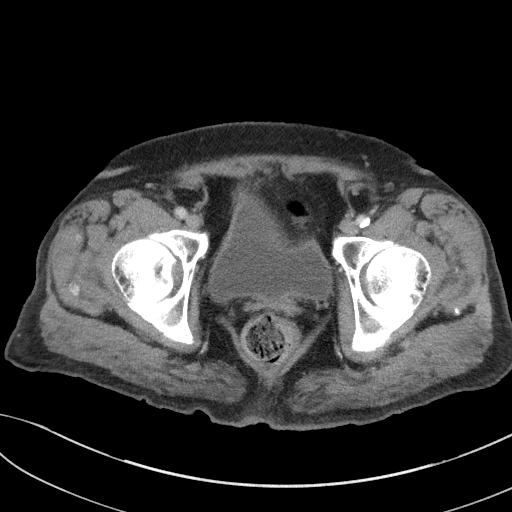
[im 14/96  bone]
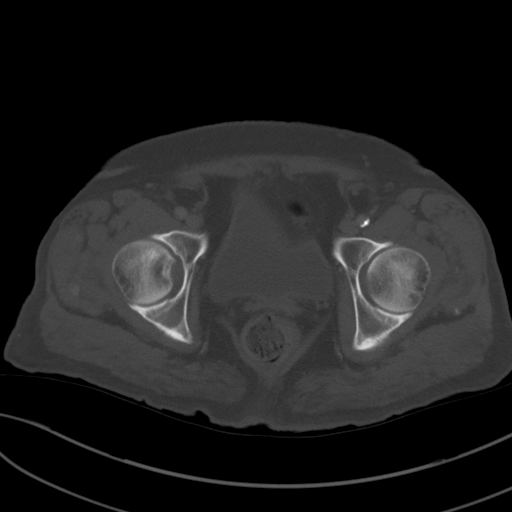
[im 28/96  soft-tissue]
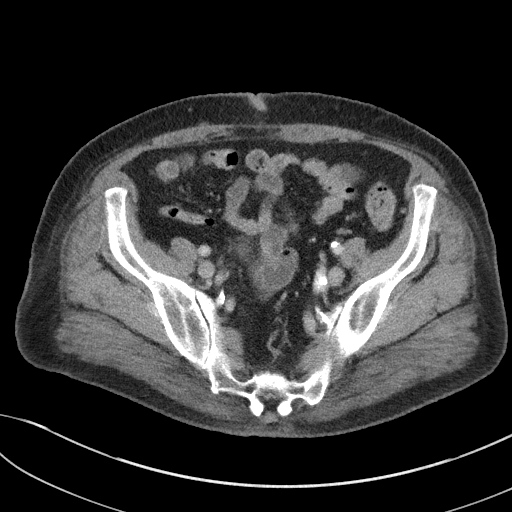
[im 41/96  soft-tissue]
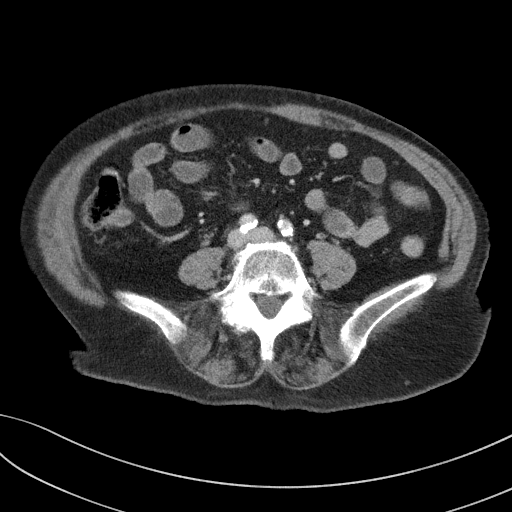
[im 41/96  lung]
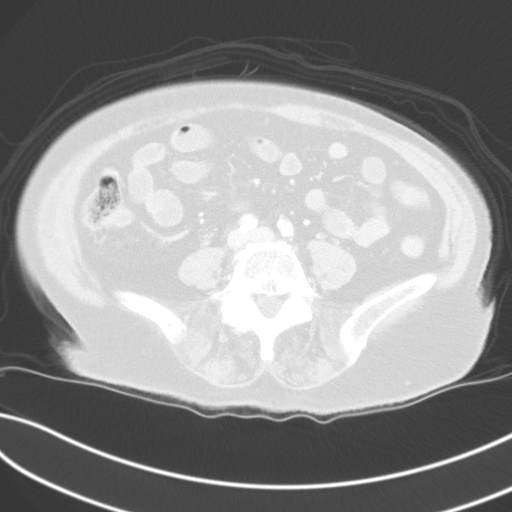
[im 55/96  soft-tissue]
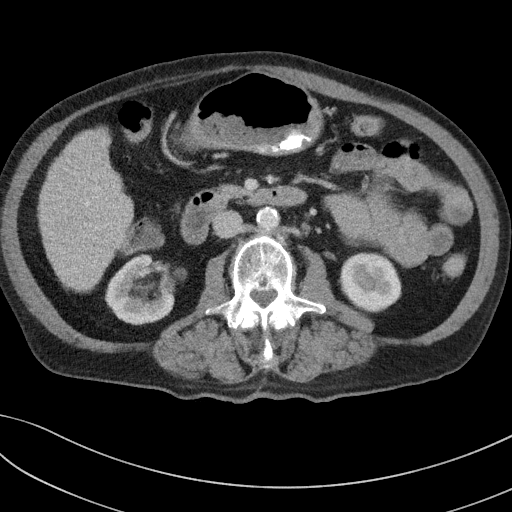
[im 55/96  lung]
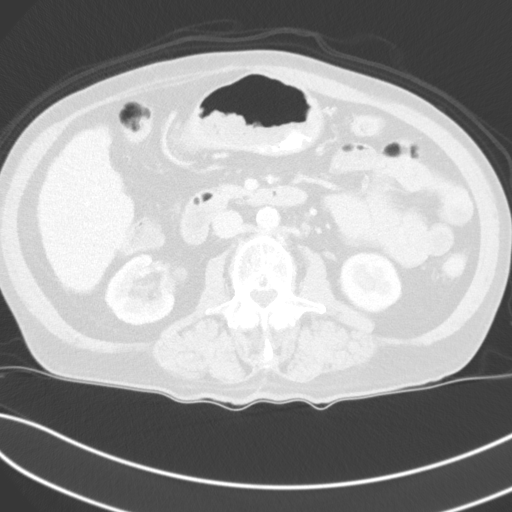
[im 68/96  soft-tissue]
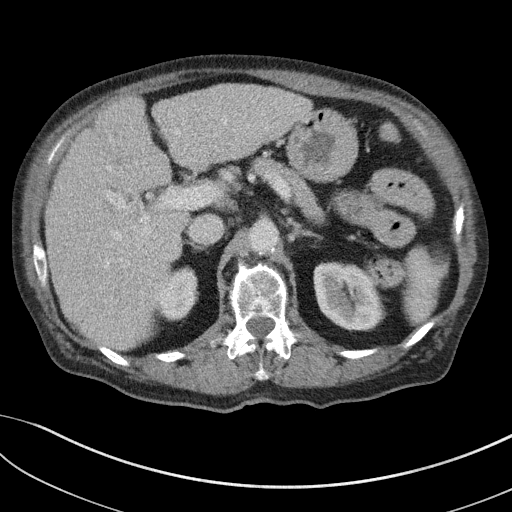
[im 68/96  lung]
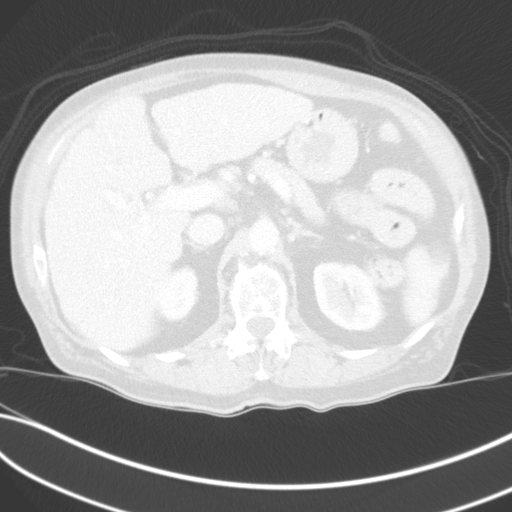
[im 82/96  soft-tissue]
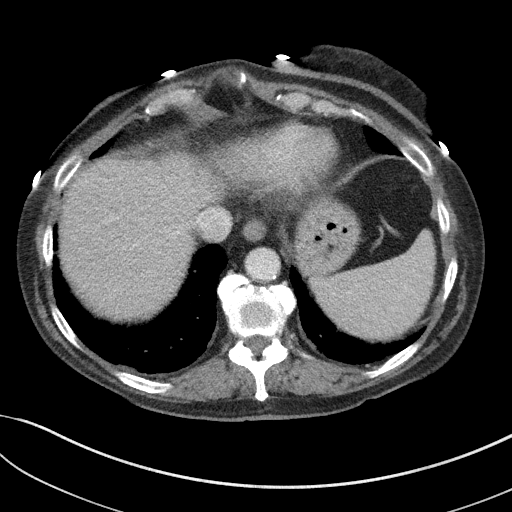
[im 82/96  lung]
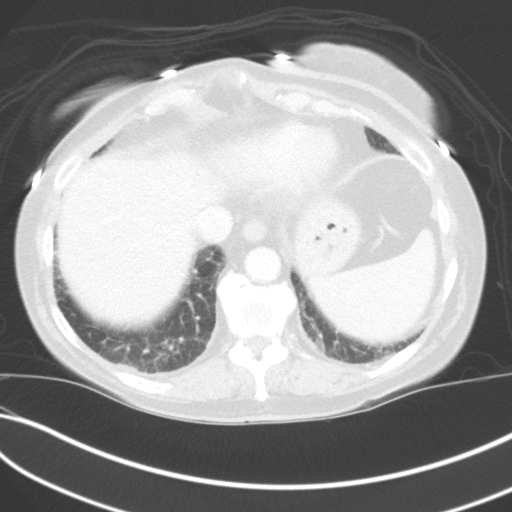

[11 of 46 positions shown; findings below may reference images not displayed]

FINDINGS: VASCULAR

Aorta: Moderate calcified atheromatous plaque. No high-grade
stenosis. No aneurysm or dissection. Eccentric nonocclusive mural
thrombus in the infrarenal segment.

Celiac: Patent without evidence of aneurysm, dissection, vasculitis
or significant stenosis.

SMA: Patent without evidence of aneurysm, dissection, vasculitis or
significant stenosis.

Renals: Single left, with irregular plaque extending from the ostium
a long length of at least 2.1 cm resulting in at least moderate
stenosis. Single right renal artery, with calcified ostial plaque
along the length of at least 1.5 cm resulting in at least mild
stenosis, patent distally.

IMA: Short-segment origin occlusion or stenosis, reconstituted
distally by visceral collaterals.

Inflow: Scattered calcified nonocclusive plaque. No aneurysm or
dissection.

Proximal Outflow: Atheromatous, patent

Veins: Patent hepatic veins, portal vein, SMV, splenic vein,
bilateral renal veins. Unremarkable iliac venous system and IVC. No
venous pathology evident.

Review of the MIP images confirms the above findings.

NON-VASCULAR

Lower chest: Right mid lung mass, incompletely visualized. Coronary
calcifications. No pleural or pericardial effusion.

Hepatobiliary: Nodular liver contour without focal lesion or biliary
ductal dilatation. Cholecystectomy clips.

Pancreas: Unremarkable. No pancreatic ductal dilatation or
surrounding inflammatory changes.

Spleen: Normal in size without focal abnormality.

Adrenals/Urinary Tract: Adrenal glands unremarkable. Symmetric renal
enhancement. No hydronephrosis. Urinary bladder incompletely
distended.

Stomach/Bowel: Stomach is nondistended. Small-bowel decompressed.
Normal appendix. The colon is nondilated, unremarkable.

Lymphatic: No abdominal or pelvic adenopathy. Stable subcentimeter
left para-aortic and aortocaval mesenteric lymph nodes since
10/30/2013.

Reproductive: Status post hysterectomy. No adnexal masses.

Other: Right pelvic phleboliths.  No ascites.  No free air.

Musculoskeletal: Postop and degenerative changes in the lower lumbar
spine. Bilateral femoral head AVN without subchondral collapse. No
fracture or worrisome bone lesion.
IMPRESSION: 1. No significant proximal mesenteric arterial occlusive disease to
suggest an etiology of mesenteric ischemia.
2. Bilateral renal artery ostial stenoses.
3. Right mid lung mass, incompletely visualized.
4. Coronary and aortic Atherosclerosis ([id number]).
5. Cirrhosis.
6. Bilateral femoral head AVN without subchondral collapse.

## 2021-02-27 ENCOUNTER — Ambulatory Visit: Payer: Medicare HMO | Admitting: Cardiology

## 2021-02-27 NOTE — Progress Notes (Signed)
Rescheduled

## 2021-02-28 ENCOUNTER — Ambulatory Visit
Admission: EM | Admit: 2021-02-28 | Discharge: 2021-02-28 | Disposition: A | Discharge status: Home / Self Care | Payer: Medicare HMO

## 2021-02-28 ENCOUNTER — Emergency Department (HOSPITAL_COMMUNITY): Payer: Medicare HMO

## 2021-02-28 ENCOUNTER — Inpatient Hospital Stay (HOSPITAL_COMMUNITY)
Admission: EM | Admit: 2021-02-28 | Discharge: 2021-03-03 | DRG: 054 | Disposition: A | Discharge status: Home / Self Care | Payer: Medicare HMO | Source: Ambulatory Visit | Attending: Internal Medicine | Admitting: Internal Medicine

## 2021-02-28 ENCOUNTER — Observation Stay (HOSPITAL_COMMUNITY): Payer: Medicare HMO

## 2021-02-28 ENCOUNTER — Other Ambulatory Visit: Payer: Self-pay

## 2021-02-28 ENCOUNTER — Encounter (HOSPITAL_COMMUNITY): Payer: Self-pay

## 2021-02-28 DIAGNOSIS — G9389 Other specified disorders of brain: Secondary | ICD-10-CM | POA: Diagnosis not present

## 2021-02-28 DIAGNOSIS — Z79890 Hormone replacement therapy (postmenopausal): Secondary | ICD-10-CM

## 2021-02-28 DIAGNOSIS — D649 Anemia, unspecified: Secondary | ICD-10-CM | POA: Diagnosis not present

## 2021-02-28 DIAGNOSIS — N1831 Chronic kidney disease, stage 3a: Secondary | ICD-10-CM | POA: Diagnosis not present

## 2021-02-28 DIAGNOSIS — J9601 Acute respiratory failure with hypoxia: Secondary | ICD-10-CM | POA: Diagnosis present

## 2021-02-28 DIAGNOSIS — Z794 Long term (current) use of insulin: Secondary | ICD-10-CM

## 2021-02-28 DIAGNOSIS — Z20822 Contact with and (suspected) exposure to covid-19: Secondary | ICD-10-CM | POA: Diagnosis present

## 2021-02-28 DIAGNOSIS — Z923 Personal history of irradiation: Secondary | ICD-10-CM

## 2021-02-28 DIAGNOSIS — Z87891 Personal history of nicotine dependence: Secondary | ICD-10-CM

## 2021-02-28 DIAGNOSIS — Z888 Allergy status to other drugs, medicaments and biological substances status: Secondary | ICD-10-CM | POA: Diagnosis not present

## 2021-02-28 DIAGNOSIS — Z880 Allergy status to penicillin: Secondary | ICD-10-CM | POA: Diagnosis not present

## 2021-02-28 DIAGNOSIS — K7460 Unspecified cirrhosis of liver: Secondary | ICD-10-CM | POA: Diagnosis not present

## 2021-02-28 DIAGNOSIS — R42 Dizziness and giddiness: Secondary | ICD-10-CM | POA: Diagnosis not present

## 2021-02-28 DIAGNOSIS — I6529 Occlusion and stenosis of unspecified carotid artery: Secondary | ICD-10-CM | POA: Diagnosis not present

## 2021-02-28 DIAGNOSIS — J811 Chronic pulmonary edema: Secondary | ICD-10-CM | POA: Diagnosis not present

## 2021-02-28 DIAGNOSIS — J410 Simple chronic bronchitis: Secondary | ICD-10-CM | POA: Diagnosis not present

## 2021-02-28 DIAGNOSIS — E119 Type 2 diabetes mellitus without complications: Secondary | ICD-10-CM | POA: Diagnosis not present

## 2021-02-28 DIAGNOSIS — F17210 Nicotine dependence, cigarettes, uncomplicated: Secondary | ICD-10-CM | POA: Diagnosis not present

## 2021-02-28 DIAGNOSIS — E039 Hypothyroidism, unspecified: Secondary | ICD-10-CM | POA: Diagnosis present

## 2021-02-28 DIAGNOSIS — J449 Chronic obstructive pulmonary disease, unspecified: Secondary | ICD-10-CM | POA: Diagnosis present

## 2021-02-28 DIAGNOSIS — R519 Headache, unspecified: Secondary | ICD-10-CM | POA: Diagnosis not present

## 2021-02-28 DIAGNOSIS — C342 Malignant neoplasm of middle lobe, bronchus or lung: Secondary | ICD-10-CM | POA: Diagnosis not present

## 2021-02-28 DIAGNOSIS — F419 Anxiety disorder, unspecified: Secondary | ICD-10-CM | POA: Diagnosis present

## 2021-02-28 DIAGNOSIS — K219 Gastro-esophageal reflux disease without esophagitis: Secondary | ICD-10-CM | POA: Diagnosis present

## 2021-02-28 DIAGNOSIS — I5033 Acute on chronic diastolic (congestive) heart failure: Secondary | ICD-10-CM

## 2021-02-28 DIAGNOSIS — Z91040 Latex allergy status: Secondary | ICD-10-CM | POA: Diagnosis not present

## 2021-02-28 DIAGNOSIS — Z79899 Other long term (current) drug therapy: Secondary | ICD-10-CM | POA: Diagnosis not present

## 2021-02-28 DIAGNOSIS — G43909 Migraine, unspecified, not intractable, without status migrainosus: Secondary | ICD-10-CM | POA: Diagnosis present

## 2021-02-28 DIAGNOSIS — Z9221 Personal history of antineoplastic chemotherapy: Secondary | ICD-10-CM

## 2021-02-28 DIAGNOSIS — I700 Atherosclerosis of aorta: Secondary | ICD-10-CM | POA: Diagnosis not present

## 2021-02-28 DIAGNOSIS — C7931 Secondary malignant neoplasm of brain: Principal | ICD-10-CM | POA: Diagnosis present

## 2021-02-28 DIAGNOSIS — E1122 Type 2 diabetes mellitus with diabetic chronic kidney disease: Secondary | ICD-10-CM | POA: Diagnosis not present

## 2021-02-28 DIAGNOSIS — Z85118 Personal history of other malignant neoplasm of bronchus and lung: Secondary | ICD-10-CM | POA: Diagnosis not present

## 2021-02-28 DIAGNOSIS — Z85841 Personal history of malignant neoplasm of brain: Secondary | ICD-10-CM | POA: Diagnosis not present

## 2021-02-28 DIAGNOSIS — I110 Hypertensive heart disease with heart failure: Secondary | ICD-10-CM | POA: Diagnosis not present

## 2021-02-28 DIAGNOSIS — J441 Chronic obstructive pulmonary disease with (acute) exacerbation: Secondary | ICD-10-CM | POA: Diagnosis present

## 2021-02-28 DIAGNOSIS — Z86718 Personal history of other venous thrombosis and embolism: Secondary | ICD-10-CM | POA: Diagnosis not present

## 2021-02-28 DIAGNOSIS — Z955 Presence of coronary angioplasty implant and graft: Secondary | ICD-10-CM | POA: Diagnosis not present

## 2021-02-28 DIAGNOSIS — I5032 Chronic diastolic (congestive) heart failure: Secondary | ICD-10-CM | POA: Diagnosis not present

## 2021-02-28 DIAGNOSIS — I2510 Atherosclerotic heart disease of native coronary artery without angina pectoris: Secondary | ICD-10-CM | POA: Diagnosis not present

## 2021-02-28 DIAGNOSIS — I509 Heart failure, unspecified: Secondary | ICD-10-CM | POA: Diagnosis not present

## 2021-02-28 DIAGNOSIS — C7951 Secondary malignant neoplasm of bone: Secondary | ICD-10-CM | POA: Diagnosis not present

## 2021-02-28 DIAGNOSIS — G936 Cerebral edema: Secondary | ICD-10-CM | POA: Diagnosis not present

## 2021-02-28 DIAGNOSIS — R112 Nausea with vomiting, unspecified: Secondary | ICD-10-CM

## 2021-02-28 DIAGNOSIS — M5136 Other intervertebral disc degeneration, lumbar region: Secondary | ICD-10-CM | POA: Diagnosis not present

## 2021-02-28 DIAGNOSIS — Z7951 Long term (current) use of inhaled steroids: Secondary | ICD-10-CM

## 2021-02-28 DIAGNOSIS — N183 Chronic kidney disease, stage 3 unspecified: Secondary | ICD-10-CM | POA: Diagnosis present

## 2021-02-28 DIAGNOSIS — G629 Polyneuropathy, unspecified: Secondary | ICD-10-CM | POA: Diagnosis not present

## 2021-02-28 DIAGNOSIS — Z7901 Long term (current) use of anticoagulants: Secondary | ICD-10-CM

## 2021-02-28 DIAGNOSIS — I252 Old myocardial infarction: Secondary | ICD-10-CM

## 2021-02-28 DIAGNOSIS — I6782 Cerebral ischemia: Secondary | ICD-10-CM | POA: Diagnosis not present

## 2021-02-28 DIAGNOSIS — I130 Hypertensive heart and chronic kidney disease with heart failure and stage 1 through stage 4 chronic kidney disease, or unspecified chronic kidney disease: Secondary | ICD-10-CM | POA: Diagnosis not present

## 2021-02-28 DIAGNOSIS — Z7984 Long term (current) use of oral hypoglycemic drugs: Secondary | ICD-10-CM

## 2021-02-28 DIAGNOSIS — I5043 Acute on chronic combined systolic (congestive) and diastolic (congestive) heart failure: Secondary | ICD-10-CM | POA: Diagnosis present

## 2021-02-28 DIAGNOSIS — Z87442 Personal history of urinary calculi: Secondary | ICD-10-CM | POA: Diagnosis not present

## 2021-02-28 HISTORY — DX: Acute on chronic diastolic (congestive) heart failure: I50.33

## 2021-02-28 LAB — LIPASE, BLOOD: Lipase: 22 U/L (ref 11–51)

## 2021-02-28 LAB — COMPREHENSIVE METABOLIC PANEL
ALT: 12 U/L (ref 0–44)
AST: 20 U/L (ref 15–41)
Albumin: 3.7 g/dL (ref 3.5–5.0)
Alkaline Phosphatase: 44 U/L (ref 38–126)
Anion gap: 12 (ref 5–15)
BUN: 20 mg/dL (ref 8–23)
CO2: 25 mmol/L (ref 22–32)
Calcium: 9.9 mg/dL (ref 8.9–10.3)
Chloride: 99 mmol/L (ref 98–111)
Creatinine, Ser: 1 mg/dL (ref 0.44–1.00)
GFR, Estimated: >60 mL/min (ref >60)
Glucose, Bld: 138 mg/dL — ABNORMAL HIGH (ref 70–99)
Potassium: 4.4 mmol/L (ref 3.5–5.1)
Sodium: 136 mmol/L (ref 135–145)
Total Bilirubin: 0.6 mg/dL (ref 0.3–1.2)
Total Protein: 7.4 g/dL (ref 6.5–8.1)

## 2021-02-28 LAB — CBC
HCT: 37.2 % (ref 36.0–46.0)
Hemoglobin: 11.7 g/dL — ABNORMAL LOW (ref 12.0–15.0)
MCH: 31.1 pg (ref 26.0–34.0)
MCHC: 31.5 g/dL (ref 30.0–36.0)
MCV: 98.9 fL (ref 80.0–100.0)
Platelets: 213 10*3/uL (ref 150–400)
RBC: 3.76 MIL/uL — ABNORMAL LOW (ref 3.87–5.11)
RDW: 13.6 % (ref 11.5–15.5)
WBC: 6.8 10*3/uL (ref 4.0–10.5)
nRBC: 0 % (ref 0.0–0.2)

## 2021-02-28 LAB — CBG MONITORING, ED: Glucose-Capillary: 97 mg/dL (ref 70–99)

## 2021-02-28 LAB — URINALYSIS, ROUTINE W REFLEX MICROSCOPIC
Bilirubin Urine: NEGATIVE
Glucose, UA: NEGATIVE mg/dL
Hgb urine dipstick: NEGATIVE
Ketones, ur: 5 mg/dL — AB
Leukocytes,Ua: NEGATIVE
Nitrite: NEGATIVE
Protein, ur: NEGATIVE mg/dL
Specific Gravity, Urine: 1.018 (ref 1.005–1.030)
pH: 5 (ref 5.0–8.0)

## 2021-02-28 LAB — TROPONIN I (HIGH SENSITIVITY)
Troponin I (High Sensitivity): 4 ng/L (ref <18)
Troponin I (High Sensitivity): 5 ng/L (ref <18)

## 2021-02-28 LAB — BRAIN NATRIURETIC PEPTIDE: B Natriuretic Peptide: 35.8 pg/mL (ref 0.0–100.0)

## 2021-02-28 LAB — RESP PANEL BY RT-PCR (FLU A&B, COVID) ARPGX2
Influenza A by PCR: NEGATIVE
Influenza B by PCR: NEGATIVE
SARS Coronavirus 2 by RT PCR: NEGATIVE

## 2021-02-28 MED ORDER — DIAZEPAM 5 MG PO TABS
2.5000 mg | ORAL_TABLET | Freq: Every day | ORAL | Status: DC
  Administered 2021-03-01 – 2021-03-02 (×3): 2.5 mg via ORAL
  Filled 2021-02-28 (×3): qty 1

## 2021-02-28 MED ORDER — SODIUM CHLORIDE 0.9 % IV BOLUS
500.0000 mL | Freq: Once | INTRAVENOUS | Status: AC
  Administered 2021-02-28: 500 mL via INTRAVENOUS

## 2021-02-28 MED ORDER — LORAZEPAM 2 MG/ML IJ SOLN
0.5000 mg | Freq: Once | INTRAMUSCULAR | Status: AC | PRN
  Administered 2021-02-28: 1 mg via INTRAVENOUS
  Filled 2021-02-28: qty 1

## 2021-02-28 MED ORDER — ALBUTEROL SULFATE (2.5 MG/3ML) 0.083% IN NEBU: 2.5000 mg | INHALATION_SOLUTION | RESPIRATORY_TRACT | Status: DC | PRN

## 2021-02-28 MED ORDER — TRAZODONE HCL 150 MG PO TABS
75.0000 mg | ORAL_TABLET | Freq: Every day | ORAL | Status: DC
  Administered 2021-03-01 – 2021-03-02 (×3): 75 mg via ORAL
  Filled 2021-02-28 (×3): qty 1

## 2021-02-28 MED ORDER — DEXAMETHASONE SODIUM PHOSPHATE 4 MG/ML IJ SOLN
4.0000 mg | Freq: Four times a day (QID) | INTRAMUSCULAR | Status: DC
  Administered 2021-03-01 – 2021-03-03 (×10): 4 mg via INTRAVENOUS
  Filled 2021-02-28 (×12): qty 1

## 2021-02-28 MED ORDER — FENTANYL CITRATE (PF) 100 MCG/2ML IJ SOLN
25.0000 ug | INTRAMUSCULAR | Status: DC | PRN
  Administered 2021-02-28 – 2021-03-01 (×2): 25 ug via INTRAVENOUS
  Filled 2021-02-28 (×2): qty 2

## 2021-02-28 MED ORDER — FLUTICASONE-UMECLIDIN-VILANT 100-62.5-25 MCG/INH IN AEPB: 1.0000 | INHALATION_SPRAY | Freq: Every day | RESPIRATORY_TRACT | Status: DC

## 2021-02-28 MED ORDER — GADOBUTROL 1 MMOL/ML IV SOLN
6.5000 mL | Freq: Once | INTRAVENOUS | Status: AC | PRN
  Administered 2021-02-28: 6.5 mL via INTRAVENOUS

## 2021-02-28 MED ORDER — INSULIN ASPART 100 UNIT/ML IJ SOLN
0.0000 [IU] | INTRAMUSCULAR | Status: DC
  Administered 2021-03-01: 1 [IU] via SUBCUTANEOUS
  Administered 2021-03-01: 2 [IU] via SUBCUTANEOUS
  Administered 2021-03-01: 1 [IU] via SUBCUTANEOUS

## 2021-02-28 MED ORDER — PRAVASTATIN SODIUM 40 MG PO TABS
40.0000 mg | ORAL_TABLET | Freq: Every day | ORAL | Status: DC
  Administered 2021-03-01 – 2021-03-02 (×2): 40 mg via ORAL
  Filled 2021-02-28 (×2): qty 1

## 2021-02-28 MED ORDER — PANTOPRAZOLE SODIUM 40 MG PO TBEC
40.0000 mg | DELAYED_RELEASE_TABLET | Freq: Every day | ORAL | Status: DC
  Administered 2021-03-01 – 2021-03-03 (×3): 40 mg via ORAL
  Filled 2021-02-28 (×4): qty 1

## 2021-02-28 MED ORDER — SODIUM CHLORIDE 0.9 % IV SOLN: 250.0000 mL | INTRAVENOUS | Status: DC | PRN

## 2021-02-28 MED ORDER — ONDANSETRON HCL 4 MG PO TABS: 4.0000 mg | ORAL_TABLET | Freq: Four times a day (QID) | ORAL | Status: DC | PRN

## 2021-02-28 MED ORDER — ACETAMINOPHEN 325 MG PO TABS: 650.0000 mg | ORAL_TABLET | Freq: Four times a day (QID) | ORAL | Status: DC | PRN

## 2021-02-28 MED ORDER — FLUTICASONE FUROATE-VILANTEROL 100-25 MCG/INH IN AEPB
1.0000 | INHALATION_SPRAY | Freq: Every day | RESPIRATORY_TRACT | Status: DC
  Administered 2021-03-01 – 2021-03-03 (×2): 1 via RESPIRATORY_TRACT
  Filled 2021-02-28: qty 28

## 2021-02-28 MED ORDER — ONDANSETRON HCL 4 MG/2ML IJ SOLN
4.0000 mg | Freq: Four times a day (QID) | INTRAMUSCULAR | Status: DC | PRN
Start: 2021-02-28 — End: 2021-03-03

## 2021-02-28 MED ORDER — UMECLIDINIUM BROMIDE 62.5 MCG/INH IN AEPB
1.0000 | INHALATION_SPRAY | Freq: Every day | RESPIRATORY_TRACT | Status: DC
  Administered 2021-03-01 – 2021-03-03 (×2): 1 via RESPIRATORY_TRACT
  Filled 2021-02-28: qty 7

## 2021-02-28 MED ORDER — MECLIZINE HCL 25 MG PO TABS
25.0000 mg | ORAL_TABLET | Freq: Three times a day (TID) | ORAL | Status: DC | PRN
  Filled 2021-02-28: qty 1

## 2021-02-28 MED ORDER — DEXAMETHASONE SODIUM PHOSPHATE 10 MG/ML IJ SOLN
10.0000 mg | Freq: Once | INTRAMUSCULAR | Status: AC
  Administered 2021-02-28: 10 mg via INTRAVENOUS
  Filled 2021-02-28: qty 1

## 2021-02-28 MED ORDER — FUROSEMIDE 10 MG/ML IJ SOLN
40.0000 mg | Freq: Once | INTRAMUSCULAR | Status: AC
  Administered 2021-02-28: 40 mg via INTRAVENOUS
  Filled 2021-02-28: qty 4

## 2021-02-28 MED ORDER — ACETAMINOPHEN 650 MG RE SUPP: 650.0000 mg | Freq: Four times a day (QID) | RECTAL | Status: DC | PRN

## 2021-02-28 MED ORDER — ONDANSETRON HCL 4 MG/2ML IJ SOLN
4.0000 mg | Freq: Once | INTRAMUSCULAR | Status: AC
  Administered 2021-02-28: 4 mg via INTRAVENOUS
  Filled 2021-02-28: qty 2

## 2021-02-28 MED ORDER — FUROSEMIDE 40 MG PO TABS
40.0000 mg | ORAL_TABLET | ORAL | Status: DC
  Administered 2021-03-01: 40 mg via ORAL
  Filled 2021-02-28: qty 1

## 2021-02-28 NOTE — ED Triage Notes (Addendum)
Pt reports headache, N/V and dizziness x3 days. Sent here from [facility] for further evaluation. Pt unable to keep food or fluids down

## 2021-02-28 NOTE — ED Notes (Signed)
2nd trop 1815

## 2021-02-28 NOTE — H&P (Signed)
History and Physical    [name] MWN:[id number] DOB: 1950/10/24 DOA: 02/28/2021  PCP: [name], MD   Patient coming from: Home   Chief Complaint: Headache, N/V, dizziness   HPI: [name] is a 70 y.o. female with medical history significant for small cell lung cancer, chronic diastolic CHF, COPD, cirrhosis, CAD, CKD 3A, anxiety, type 2 diabetes mellitus, and history of left upper extremity superficial vein thrombosis no longer on Xarelto, now presenting to the emergency department with 3 days of headache, nausea, vomiting, and dizziness.  Patient reports that she had been in her usual state until 3 days ago when she developed a new headache mostly involving the front of her head, and nausea, vomiting, and dizziness mainly with movement.  She feels better with laying still.  She has never experienced this previously.  She denies any fevers, chills, cough, shortness of breath, or leg swelling.  She was diagnosed with small cell lung cancer after presenting in August 2021 with right middle lobe mass, adenopathy, and tiny solitary brain metastasis.  She is undergoing chemotherapy and radiotherapy and is currently being observed by oncology with plan for repeat CT in June.  ED Course: Upon arrival to the ED, patient is found to be afebrile, saturating mid 80s on room air, and with stable blood pressure.  EKG features a sinus rhythm.  Chest x-ray notable for known RML mass and chronic interstitial markings with possible acute increased interstitial opacities.  Chemistry panel unremarkable and CBC with mild normocytic anemia.  BNP and troponin are normal.  Neurosurgery was consulted by the ED physician and the patient was treated with Decadron, Zofran, and IV fluids.  Review of Systems:  All other systems reviewed and apart from HPI, are negative.  Past Medical History:  Diagnosis Date  . Acute on chronic diastolic CHF (congestive heart failure) ([place]) 02/28/2021  . Allergic rhinitis    . 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 Anemia   . Anxiety   . ASCVD (arteriosclerotic cardiovascular disease)    MI in 96 requiring BMS CX; DES to M1 in 2000;normal coronary angiography in 2004  . Cholelithiasis   . COPD (chronic obstructive pulmonary disease) ([place])   . DDD (degenerative disc disease), lumbar   . Depression   . Diabetes mellitus    Type II  . Diarrhea   . DVT (deep venous thrombosis) ([place])    patient said no  . GERD (gastroesophageal reflux disease)   . Headache    migraine  . History of kidney stones    2006  . History of radiation therapy 06/28/2020-08/08/2020   right Lung; Dr. [name]  . Hyperlipidemia   . Hypertension   . Hypotension   . Hypothyroidism   . Low back pain   . Myocardial infarction ([place]) 01/1995  . Nephrolithiasis 2006   stone extraction   . Panic attacks   . Peripheral neuropathy   . Peripheral vascular disease ([facility])    legs  . Pneumonia 12/22/2019  . Sciatic pain    right  . Small cell lung cancer ([place])   . Tobacco abuse   . Tremor     Past Surgical History:  Procedure Laterality Date  . AGILE CAPSULE N/A 03/07/2020   Procedure: AGILE CAPSULE;  Surgeon: Daneil Dolin, MD;  [facility];  Service: Endoscopy;  Laterality: N/A;  7:30am  . BACK SURGERY     fusion  . BRONCHIAL NEEDLE ASPIRATION BIOPSY  N/A 05/20/2020   Procedure: BRONCHIAL NEEDLE ASPIRATION BIOPSIES;  Surgeon: Collene Gobble, MD;  Location[facility] ENDOSCOPY;  Service: Pulmonary;  Laterality: N/A;  . CHOLECYSTECTOMY    . COLONOSCOPY  01/2009   EXB:[id number] rectum/repeat in 5 yrs  . COLONOSCOPY N/A 04/29/2014   Dr.Rourk- attempted/incomplete colonoscopy. inadequate prep  . COLONOSCOPY N/A 05/27/2014   GMW:[id number] coli. Colonic polyps-removed as described above.Status post segmental biopsy. single tubular adenoma and random colon bx neg  . COLONOSCOPY WITH PROPOFOL N/A 12/23/2019   Procedure: COLONOSCOPY WITH PROPOFOL;  Surgeon: Ronnette Juniper,  MD;  Location: [place];  Service: Gastroenterology;  Laterality: N/A;  . DILATION AND CURETTAGE OF UTERUS  1974  . ESOPHAGOGASTRODUODENOSCOPY  05/2010   Dr. Tessie Fass, erosion. 71F dilation  . ESOPHAGOGASTRODUODENOSCOPY N/A 04/29/2014   Dr.Rourk- normal esophagus s/p passage of maloney dilator. small hiatal hernia- bx= chronic inflammation.  . ESOPHAGOGASTRODUODENOSCOPY (EGD) WITH PROPOFOL N/A 12/23/2019   Procedure: ESOPHAGOGASTRODUODENOSCOPY (EGD) WITH PROPOFOL;  Surgeon: Ronnette Juniper, MD;  Location: [place];  Service: Gastroenterology;  Laterality: N/A;  . GIVENS CAPSULE STUDY N/A 03/22/2020   Procedure: GIVENS CAPSULE STUDY;  Surgeon: Daneil Dolin, MD;  [facility];  Service: Endoscopy;  Laterality: N/A;  7:30am  . HEMOSTASIS CLIP PLACEMENT  12/23/2019   Procedure: HEMOSTASIS CLIP PLACEMENT;  Surgeon: Ronnette Juniper, MD;  Location: [place];  Service: Gastroenterology;;  . HOT HEMOSTASIS N/A 12/23/2019   Procedure: HOT HEMOSTASIS (ARGON PLASMA COAGULATION/BICAP);  Surgeon: Ronnette Juniper, MD;  Location: [place];  Service: Gastroenterology;  Laterality: N/A;  . LEFT HEART CATH AND CORONARY ANGIOGRAPHY N/A 12/24/2019   Procedure: LEFT HEART CATH AND CORONARY ANGIOGRAPHY;  Surgeon: Nigel Mormon, MD;  Location: [place] [facility];  Service: Cardiovascular;  Laterality: N/A;  . Venia Minks DILATION N/A 04/29/2014   Procedure: Venia Minks DILATION;  Surgeon: Daneil Dolin, MD;  [facility];  Service: Endoscopy;  Laterality: N/A;  . PARTIAL HYSTERECTOMY  1978  . POLYPECTOMY  12/23/2019   Procedure: POLYPECTOMY;  Surgeon: Ronnette Juniper, MD;  Location: [facility] ENDOSCOPY;  Service: Gastroenterology;;  . SHOULDER SURGERY Left    Left shoulder for RTC;left arm surgery '98/left hand surgery 2001  . TOTAL ABDOMINAL HYSTERECTOMY W/ BILATERAL SALPINGOOPHORECTOMY  2002  . UMBILICAL HERNIA REPAIR  2008  . VIDEO BRONCHOSCOPY WITH ENDOBRONCHIAL ULTRASOUND N/A 05/20/2020   Procedure:  VIDEO BRONCHOSCOPY WITH ENDOBRONCHIAL ULTRASOUND;  Surgeon: Collene Gobble, MD;  Location[facility] END[facility]COPY;  Service: Pulmonary;  Laterality: N/A;    Social History:   reports that she quit smoking about 3 years ago. Her smoking use included cigarettes. She has a 20.00 pack-year smoking history. She has never used smokeless tobacco. She reports that she does not drink alcohol and does not use drugs.  Allergies  Allergen Reactions  . Iohexol      Desc: CHEST TIGHTNESS,BRETHING PROBLEMS NEEDS PRE MEDS 13 hour premeds given 06/08/2020 without incident   . Tape Other (See Comments)    Tears skin  . Ciprofloxacin Rash  . Latex Rash    GLOVES   . Penicillins Swelling    Did it involve swelling of the face/tongue/throat, SOB, or low BP? Yes Did it involve sudden or severe rash/hives, skin peeling, or any reaction on the inside of your mouth or nose? No Did you need to seek medical attention at a hospital or doctor's office? Yes When did it last happen?1996 If all above answers are "NO", may proceed with cephalosporin use.  . Povidone-Iodine Rash  Family History  Problem Relation Age of Onset  . Depression Mother   . Bipolar disorder Mother   . Dementia Mother   . Pulmonary fibrosis Mother   . Alcohol abuse Father   . Aneurysm Father        deceased age 8, brain  . Colon cancer Paternal Grandfather        age greater than 58     Prior to Admission medications   Medication Sig Start Date End Date Taking? Authorizing Provider  albuterol (VENTOLIN HFA) 108 (90 Base) MCG/ACT inhaler Inhale 2 puffs into the lungs every 4 (four) hours as needed for wheezing or shortness of breath. 11/17/20   Chesley Mires, MD  Alpha-Lipoic Acid 200 MG CAPS Take by mouth.    [provider]  aspirin-acetaminophen-caffeine (EXCEDRIN MIGRAINE) [phone] MG tablet Take 2 tablets by mouth daily as needed for headache. Up to 4 times a week    [provider]  b complex vitamins tablet  Take 1 tablet by mouth daily. With Zinc and vitamin C    [provider]  Benzocaine (BOIL-EASE EX) Apply 1 application topically daily as needed (Boil).    [provider]  Biotin 5000 MCG TABS Take 10,000 mcg by mouth daily. Keratin    [provider]  carbamide peroxide (DEBROX) 6.5 % OTIC solution 5 drops daily as needed.    [provider]  Cholecalciferol (VITAMIN D3) 50 MCG (2000 UT) TABS Take 8,000 Units by mouth daily.    [provider]  colestipol (COLESTID) 1 g tablet Take 3 tablets (3 g total) by mouth daily. Do not take within two hours of other medications. 12/14/20   Mahala Menghini, PA-C  Cyanocobalamin (VITAMIN B-12 PO) Take 5,000 mg by mouth. Vit B6 also    [provider]  diazepam (VALIUM) 5 MG tablet Take 5 mg by mouth daily as needed. 1/2 tab qhs    [provider]  diclofenac Sodium (VOLTAREN) 1 % GEL Apply topically 4 (four) times daily.    [provider]  Fluticasone-Umeclidin-Vilant (TRELEGY ELLIPTA) 100-62.5-25 MCG/INH AEPB Inhale 1 puff into the lungs daily. 05/31/20   Chesley Mires, MD  folic acid (FOLVITE) 1 MG tablet Take by mouth daily. 1,333 mcg QD    [provider]  furosemide (LASIX) 40 MG tablet Take 1 tablet (40 mg total) by mouth 2 (two) times daily. Patient taking differently: Take 40 mg by mouth every 3 (three) days. 12/25/19   Shelly Coss, MD  hydrocortisone cream 1 % Apply 1 application topically daily as needed for itching.    [provider]  Hydrocortisone, Perianal, (PROCTO-PAK) 1 % CREA Apply anorectally twice daily for two weeks. 07/12/20   Mahala Menghini, PA-C  levothyroxine (SYNTHROID) 112 MCG tablet Take 112 mcg by mouth daily. 12/02/20   [provider]  metFORMIN (GLUCOPHAGE) 500 MG tablet Take 1,000 mg by mouth 2 (two) times daily with a meal. Pt has been taking 2tabs BID. 09/18/19   [provider]  Multiple Vitamins-Minerals (MULTIVITAMIN  WITH MINERALS) tablet Take 1 tablet by mouth daily. Adult 50+    [provider]  neomycin-bacitracin-polymyxin (NEOSPORIN) OINT Apply 1 application topically daily as needed for irritation or wound care.    [provider]  omega-3 acid ethyl esters (LOVAZA) 1 g capsule Take 2 g by mouth 2 (two) times daily.    [provider]  OVER THE COUNTER MEDICATION Apple Cider Vinegar 473m QD  [provider]  OVER THE COUNTER MEDICATION Code Liver Oil with Vit A and D3 QD    [provider]  oxyCODONE-acetaminophen (PERCOCET/ROXICET) 5-325 MG tablet Take 0.5 tablets by mouth 2 (two) times daily as needed for severe pain. 05/20/20   Collene Gobble, MD  pantoprazole (PROTONIX) 40 MG tablet Take 40 mg by mouth daily. 09/29/19   [provider]  pravastatin (PRAVACHOL) 40 MG tablet Take 40 mg by mouth daily. 07/11/20   [provider]  predniSONE (DELTASONE) 50 MG tablet Take 1 tablet 13 hours before your CT scan, 7 hours before your CT scan, and 1 hour before your CT scan 12/27/20   Heilingoetter, Cassandra L, PA-C  Probiotic Product (PROBIOTIC PO) Take 1 capsule by mouth daily.    [provider]  pyridOXINE (VITAMIN B-6) 50 MG tablet Take 200 mg by mouth daily.    [provider]  TechLite Lancets [place]  07/18/20   [provider]  traZODone (DESYREL) 150 MG tablet Take 150 mg by mouth at bedtime. 1/2 tab qhs 08/19/15   [provider]  XARELTO 20 MG TABS tablet Take 20 mg by mouth daily. 11/17/20   [provider]    Physical Exam: Vitals:   02/28/21 1715 02/28/21 1800 02/28/21 1830 02/28/21 1900  BP: 138/70 (!) 135/57 140/64 (!) 142/56  Pulse: 81 (!) 59 (!) 58 (!) 57  Resp: (!) 22 18 20  (!) 21  Temp:      TempSrc:      SpO2: 93% 92% 92% 98%    Constitutional: NAD, calm  Eyes: PERTLA, lids and conjunctivae normal ENMT: Mucous membranes are moist. Posterior pharynx clear of any exudate or  lesions.   Neck: supple, no masses  Respiratory:  no wheezing, no crackles. No accessory muscle use.  Cardiovascular: S1 & S2 heard, regular rate and rhythm. No extremity edema.  Abdomen: No distension, no tenderness, soft. Bowel sounds active.  Musculoskeletal: no clubbing / cyanosis. No joint deformity upper and lower extremities.   Skin: no significant rashes, lesions, ulcers. Warm, dry, well-perfused. Neurologic: CN 2-12 grossly intact. Sensation intact. Moving all extremities.  Psychiatric: Alert and oriented to person, place, and situation. Very pleasant and cooperative.    Labs and Imaging on Admission: I have personally reviewed following labs and imaging studies  CBC: Recent Labs  Lab 02/28/21 1056  WBC 6.8  HGB 11.7*  HCT 37.2  MCV 98.9  PLT 732   Basic Metabolic Panel: Recent Labs  Lab 02/28/21 1056  NA 136  K 4.4  CL 99  CO2 25  GLUCOSE 138*  BUN 20  CREATININE 1.00  CALCIUM 9.9   GFR: CrCl cannot be calculated (Unknown ideal weight.). Liver Function Tests: Recent Labs  Lab 02/28/21 1056  AST 20  ALT 12  ALKPHOS 44  BILITOT 0.6  PROT 7.4  ALBUMIN 3.7   Recent Labs  Lab 02/28/21 1056  LIPASE 22   No results for input(s): AMMONIA in the last 168 hours. Coagulation Profile: No results for input(s): INR, PROTIME in the last 168 hours. Cardiac Enzymes: No results for input(s): CKTOTAL, CKMB, CKMBINDEX, TROPONINI in the last 168 hours. BNP (last 3 results) No results for input(s): PROBNP in the last 8760 hours. HbA1C: No results for input(s): HGBA1C in the last 72 hours. CBG: Recent Labs  Lab 02/28/21 2005  GLUCAP 97   Lipid Profile: No results for input(s): CHOL, HDL, LDLCALC, TRIG, CHOLHDL, LDLDIRECT in the last 72 hours. Thyroid  Function Tests: No results for input(s): TSH, T4TOTAL, FREET4, T3FREE, THYROIDAB in the last 72 hours. Anemia Panel: No results for input(s): VITAMINB12, FOLATE, FERRITIN, TIBC, IRON, RETICCTPCT in the last 72  hours. Urine analysis:    Component Value Date/Time   COLORURINE AMBER (A) 02/28/2021 1904   APPEARANCEUR CLOUDY (A) 02/28/2021 1904   LABSPEC 1.018 02/28/2021 1904   PHURINE 5.0 02/28/2021 1904   GLUCOSEU NEGATIVE 02/28/2021 1904   HGBUR NEGATIVE 02/28/2021 1904   BILIRUBINUR NEGATIVE 02/28/2021 1904   KETONESUR 5 (A) 02/28/2021 1904   PROTEINUR NEGATIVE 02/28/2021 1904   UROBILINOGEN 0.2 09/04/2014 1841   NITRITE NEGATIVE 02/28/2021 1904   LEUKOCYTESUR NEGATIVE 02/28/2021 1904   Sepsis Labs: @LABRCNTIP (procalcitonin:4,lacticidven:4) ) Recent Results (from the past 240 hour(s))  Resp Panel by RT-PCR (Flu A&B, Covid) Nasopharyngeal Swab     Status: None   Collection Time: 02/28/21  5:03 PM   Specimen: Nasopharyngeal Swab; Nasopharyngeal(NP) swabs in vial transport medium  Result Value Ref Range Status   SARS Coronavirus 2 by RT PCR NEGATIVE NEGATIVE Final    Comment: (NOTE) SARS-CoV-2 target nucleic acids are NOT DETECTED.  The SARS-CoV-2 RNA is generally detectable in upper respiratory specimens during the acute phase of infection. The lowest concentration of SARS-CoV-2 viral copies this assay can detect is 138 copies/mL. A negative result does not preclude SARS-Cov-2 infection and should not be used as the sole basis for treatment or other patient management decisions. A negative result may occur with  improper specimen collection/handling, submission of specimen other than nasopharyngeal swab, presence of viral mutation(s) within the areas targeted by this assay, and inadequate number of viral copies(<138 copies/mL). A negative result must be combined with clinical observations, patient history, and epidemiological information. The expected result is Negative.  Fact Sheet for Patients:  EntrepreneurPulse.com.au  Fact Sheet for Healthcare Providers:  IncredibleEmployment.be  This test is no t yet approved or cleared by the [place] FDA and  has been authorized for detection and/or diagnosis of SARS-CoV-2 by FDA under an Emergency Use Authorization (EUA). This EUA will remain  in effect (meaning this test can be used) for the duration of the COVID-19 declaration under Section 564(b)(1) of the Act, 21 U.S.C.section 360bbb-3(b)(1), unless the authorization is terminated  or revoked sooner.       Influenza A by PCR NEGATIVE NEGATIVE Final   Influenza B by PCR NEGATIVE NEGATIVE Final    Comment: (NOTE) The Xpert Xpress SARS-CoV-2/FLU/RSV plus assay is intended as an aid in the diagnosis of influenza from Nasopharyngeal swab specimens and should not be used as a sole basis for treatment. Nasal washings and aspirates are unacceptable for Xpert Xpress SARS-CoV-2/FLU/RSV testing.  Fact Sheet for Patients: EntrepreneurPulse.com.au  Fact Sheet for Healthcare Providers: IncredibleEmployment.be  This test is not yet approved or cleared by the [place] FDA and has been authorized for detection and/or diagnosis of SARS-CoV-2 by FDA under an Emergency Use Authorization (EUA). This EUA will remain in effect (meaning this test can be used) for the duration of the COVID-19 declaration under Section 564(b)(1) of the Act, 21 U.S.C. section 360bbb-3(b)(1), unless the authorization is terminated or revoked.  Performed at [facility], [place] [address] [place], [place] [address]      Radiological Exams on Admission: CT Head Wo Contrast  Result Date: 02/28/2021 CLINICAL DATA:  Peripheral vertigo EXAM: CT HEAD WITHOUT CONTRAST TECHNIQUE: Contiguous axial images were obtained from the base of the skull through the vertex without intravenous contrast.  COMPARISON:  Most recent MRI 12/31/2020 CT 02/07/2015 FINDINGS: Brain: Marked hypoattenuation in the left cerebellar hemisphere with a volume positive process partially effacing the fourth ventricle extending from the midline  cerebellum (4/9) concerning for enlarging underlying metastatic lesion in this patient with history of small cell lung cancer and previously demonstrated cerebellar metastatic focus in this vicinity. No supratentorial mass is CT evident. No evidence of acute infarction, hemorrhage, hydrocephalus. Insert white matter Symmetric prominence of the remaining ventricles, cisterns and sulci compatible with parenchymal volume loss. Vascular: Atherosclerotic calcification of the carotid siphons. No hyperdense vessel. Skull: No calvarial fracture or suspicious osseous lesion. No scalp swelling or hematoma. Sinuses/Orbits: Paranasal sinuses and mastoid air cells are predominantly clear. Prior right lens extraction. Orbital contents are otherwise unremarkable. Other: None. 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 Consider further characterization with a contrast enhanced MRI. These results were called by telephone at the time of interpretation on 02/28/2021 at 6:26 pm to provider [place] , who verbally acknowledged these results. Electronically Signed   By: Lovena Le M.D.   On: 02/28/2021 18:26   DG Chest Portable 1 View  Result Date: 02/28/2021 CLINICAL DATA:  Headache and dizziness x3 days. EXAM: PORTABLE CHEST 1 VIEW COMPARISON:  January 09, 2020 FINDINGS: Mild to moderate severity increased interstitial lung markings are seen. In ill-defined focal opacity is seen overlying the right lung base. This is present on the prior study. There is no evidence of a pleural effusion or pneumothorax. The heart size and mediastinal contours are within normal limits. Mild calcification of the aortic arch is noted. The visualized skeletal structures are unremarkable. IMPRESSION: 1. Increased interstitial lung markings which are likely, in part, chronic in nature. A mild superimposed component of interstitial edema cannot be  excluded. 2. Findings consistent with the patient's known RIGHT middle lobe pulmonary mass. Electronically Signed   By: Virgina Norfolk M.D.   On: 02/28/2021 17:40    EKG: Independently reviewed. Sinus rhythm.   Assessment/Plan  1. 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 Small cell lung cancer  - Presented in August 2021 with RML mass, right hilar and paratracheal adenopathy, and tiny solitary brain metastasis   - Followed by Dr. [name], was treated with chemotherapy and radiotherapy, and currently on observation with plan for repeat CT in June    3. Acute hypoxic respiratory failure    - Saturations were mid-80s on rm air, now stable on 2 Lpm supplemental O2  - Patient denies cough, wheeze, fever, leg swelling, or SOB  - BNP is normal, CXR with known RML mass and suggestion of possible mild interstitial edema  - Plan for IV Lasix x1 followed by her usual oral dosing, continue supplemental O2 as needed    4. CKD IIIa - SCr is 1.00 on admission, lower than priors  - Monitor   5. COPD - No cough or wheezing on admission  - Continue Trelegy and as-needed albuterol    6. Type II DM  - A1c was 8.2% in March 2021  - She will be on systemic steroids as above  - Check CBGs, use SSI as  needed   7. Chronic diastolic CHF  - No peripheral edema noted and BNP is normal but she has new supplemental O2 requirement and suggestion of mild interstitial edema on CXR - EF was preserved one year ago   - Lasix 40 mg IV x1 then resume her usual oral dosing, monitor weight and I/Os   8. CAD - No anginal complaints  - Continue statin   9. Cirrhosis  - Appears  compensated, continue strict alcohol avoidance    DVT prophylaxis: SCDs  Code Status: Full Level of Care: Level of care: Telemetry Medical Family Communication: None present  Disposition Plan:  Patient is from: Home  Anticipated d/c is to: Home  Anticipated d/c date is: Possibly as early as 03/01/21 Patient currently: Pending MRI brain, neurosurgery consultation, symptom-control  Consults called: Neurosurgery  Admission status: Observation    Vianne Bulls, MD [facility]  02/28/2021, 8:28 PM

## 2021-02-28 NOTE — ED Provider Notes (Signed)
[place] EMERGENCY DEPARTMENT Provider Note   CSN: [id number] Arrival date & time: 02/28/21  1027     History Chief Complaint  Patient presents with  . Emesis  . Dizziness  . Headache    [name] is a 70 y.o. female.  The history is provided by the patient.  Emesis Severity:  Moderate Duration:  3 days Timing:  Intermittent Quality:  Stomach contents Progression:  Unchanged Chronicity:  New Recent urination:  Normal Context: not post-tussive   Relieved by:  Nothing Worsened by:  Nothing Ineffective treatments:  None tried Associated symptoms: diarrhea, fever and headaches   Associated symptoms: no abdominal pain, no arthralgias, no cough, no sore throat and no URI   Associated symptoms comment:  Hypoxia at urgent care  Risk factors: no suspect food intake and no travel to endemic areas   Patient with COPD not on O2 presents with hypoxia, nausea, vomiting and diarrhea and headache for 3 days.  No f/c/r.  No cough, no SOB.  No CP.  No weakness.  Feels like she is falling.      Past Medical History:  Diagnosis Date  . Allergic rhinitis   . 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 Anemia   . Anxiety   . ASCVD (arteriosclerotic cardiovascular disease)    MI in 96 requiring BMS CX; DES to M1 in 2000;normal coronary angiography in 2004  . Cholelithiasis   . COPD (chronic obstructive pulmonary disease) ([place])   . DDD (degenerative disc disease), lumbar   . Depression   . Diabetes mellitus    Type II  . Diarrhea   . DVT (deep venous thrombosis) ([place])    patient said no  . GERD (gastroesophageal reflux disease)   . Headache    migraine  . History of kidney stones    2006  . History of radiation therapy 06/28/2020-08/08/2020   right Lung; Dr. [name]  . Hyperlipidemia   . Hypertension   . Hypotension   . Hypothyroidism   . Low back pain   . Myocardial infarction ([place]) 01/1995  . Nephrolithiasis 2006   stone  extraction   . Panic attacks   . Peripheral neuropathy   . Peripheral vascular disease (H[facility])    legs  . Pneumonia 12/22/2019  . Sciatic pain    right  . Small cell lung cancer ([place])   . Tobacco abuse   . Tremor     Patient Active Problem List   Diagnosis Date Noted  . 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 Encounter for antineoplastic chemotherapy 05/30/2020  . Goals of care, counseling/discussion 05/30/2020  . Intolerance to cold 05/23/2020  . Mediastinal adenopathy 05/20/2020  . Abnormal weight loss 05/17/2020  . IDA (iron deficiency anemia) 02/15/2020  . Hepatic cirrhosis ([place]) 02/15/2020  . CKD (chronic kidney disease), stage IIIa 01/09/2020  . Lung mass 01/09/2020  . Orthostatic hypotension 12/31/2019  . Non-ST elevation (NSTEMI) myocardial infarction ([place])   . Anemia   . Dyspnea   . Hx of heart artery stent   . Benign hypertension with CKD (chronic kidney disease) stage III ([place])   . Former smoker   . Chest pain 12/20/2019  . Weakness 09/04/2014  . Dizziness 09/04/2014  . Hepatomegaly 03/31/2014  . Esophageal dysphagia 03/31/2014  . Unspecified constipation 03/31/2014  . AKI (acute kidney injury) ([place]) 10/30/2013  . Altered mental status 09/28/2013  . ETOH abuse 09/28/2013  .  Altered mental state 09/28/2013  . Hyperkalemia 06/05/2013  . Bradycardia 06/05/2013  . Edema 06/02/2013  . Hyponatremia 06/02/2013  . Pneumonia due to infectious agent 12/26/2012  . Insomnia due to mental disorder 09/02/2012  . RECTAL PAIN 09/04/2010  . CHEST PAIN 08/22/2010  . Insulin dependent type 2 diabetes mellitus ([place]) 06/16/2010  . Coronary artery disease/Prior Stents 1997 and 2000/RCA occlusion 12/2019 06/16/2010  . MICROALBUMINURIA 06/01/2009  . 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 OTHER DYSPHAGIA 01/05/2009  . COLONIC POLYPS, ADENOMATOUS, HX OF 01/05/2009  . SCHATZKI'S RING, HX OF 01/05/2009  . BACK PAIN 08/04/2008  . DIARRHEA, CHRONIC 08/04/2008   . TOBACCO ABUSE 06/03/2007  . IBS 06/03/2007  . NEPHROLITHIASIS 02/04/2007  . ANXIETY STATE NOS 11/26/2006  . Hypothyroidism 11/01/2006  . Mixed hyperlipidemia 11/01/2006  . Depression 11/01/2006  . PERIPHERAL NEUROPATHY 11/01/2006  . CATARACT NOS 11/01/2006  . ALLERGIC RHINITIS 11/01/2006  . COPD (chronic obstructive pulmonary disease) ([place]) 11/01/2006  . GERD 11/01/2006  . DEGENERATION, DISC NOS 11/01/2006  . LOW BACK PAIN 11/01/2006  . DVT, HX OF 11/01/2006    Past Surgical History:  Procedure Laterality Date  . AGILE CAPSULE N/A 03/07/2020   Procedure: AGILE CAPSULE;  Surgeon: Daneil Dolin, MD;  [facility];  Service: Endoscopy;  Laterality: N/A;  7:30am  . BACK SURGERY     fusion  . BRONCHIAL NEEDLE ASPIRATION BIOPSY N/A 05/20/2020   Procedure: BRONCHIAL NEEDLE ASPIRATION BIOPSIES;  Surgeon: Collene Gobble, MD;  Location: [facility] ENDOSCOPY;  Service: Pulmonary;  Laterality: N/A;  . CHOLECYSTECTOMY    . COLONOSCOPY  01/2009   ZOX:WRUEAV rectum/repeat in 5 yrs  . COLONOSCOPY N/A 04/29/2014   Dr.Rourk- attempted/incomplete colonoscopy. inadequate prep  . COLONOSCOPY N/A 05/27/2014   WUJ:[id number] coli. Colonic polyps-removed as described above.Status post segmental biopsy. single tubular adenoma and random colon bx neg  . COLONOSCOPY WITH PROPOFOL N/A 12/23/2019   Procedure: COLONOSCOPY WITH PROPOFOL;  Surgeon: Ronnette Juniper, MD;  Location: [place];  Service: Gastroenterology;  Laterality: N/A;  . DILATION AND CURETTAGE OF UTERUS  1974  . ESOPHAGOGASTRODUODENOSCOPY  05/2010   Dr. Tessie Fass, erosion. 28F dilation  . ESOPHAGOGASTRODUODENOSCOPY N/A 04/29/2014   Dr.Rourk- normal esophagus s/p passage of maloney dilator. small hiatal hernia- bx= chronic inflammation.  . ESOPHAGOGASTRODUODENOSCOPY (EGD) WITH PROPOFOL N/A 12/23/2019   Procedure: ESOPHAGOGASTRODUODENOSCOPY (EGD) WITH PROPOFOL;  Surgeon: Ronnette Juniper, MD;  Location: [facility];  Service: Gastroenterology;   Laterality: N/A;  . GIVENS CAPSULE STUDY N/A 03/22/2020   Procedure: GIVENS CAPSULE STUDY;  Surgeon: Daneil Dolin, MD;  [facility];  Service: Endoscopy;  Laterality: N/A;  7:30am  . HEMOSTASIS CLIP PLACEMENT  12/23/2019   Procedure: HEMOSTASIS CLIP PLACEMENT;  Surgeon: Ronnette Juniper, MD;  Location: [facility];  Service: Gastroenterology;;  . HOT HEMOSTASIS N/A 12/23/2019   Procedure: HOT HEMOSTASIS (ARGON PLASMA COAGULATION/BICAP);  Surgeon: Ronnette Juniper, MD;  Location: [facility];  Service: Gastroenterology;  Laterality: N/A;  . LEFT HEART CATH AND CORONARY ANGIOGRAPHY N/A 12/24/2019   Procedure: LEFT HEART CATH AND CORONARY ANGIOGRAPHY;  Surgeon: Nigel Mormon, MD;  Location: [facility] CV LAB;  Service: Cardiovascular;  Laterality: N/A;  . Venia Minks DILATION N/A 04/29/2014   Procedure: Venia Minks DILATION;  Surgeon: Daneil Dolin, MD;  [facility];  Service: Endoscopy;  Laterality: N/A;  . PARTIAL HYSTERECTOMY  1978  . POLYPECTOMY  12/23/2019   Procedure: POLYPECTOMY;  Surgeon: Ronnette Juniper, MD;  Location: [facility];  Service: Gastroenterology;;  .  SHOULDER SURGERY Left    Left shoulder for RTC;left arm surgery '98/left hand surgery 2001  . TOTAL ABDOMINAL HYSTERECTOMY W/ BILATERAL SALPINGOOPHORECTOMY  2002  . UMBILICAL HERNIA REPAIR  2008  . VIDEO BRONCHOSCOPY WITH ENDOBRONCHIAL ULTRASOUND N/A 05/20/2020   Procedure: VIDEO BRONCHOSCOPY WITH ENDOBRONCHIAL ULTRASOUND;  Surgeon: Collene Gobble, MD;  Location: [facility] ENDOSCOPY;  Service: Pulmonary;  Laterality: N/A;     OB History    Gravida  3   Para  2   Term  2   Preterm      AB  1   Living  2     SAB  1   IAB      Ectopic      Multiple      Live Births              Family History  Problem Relation Age of Onset  . Depression Mother   . Bipolar disorder Mother   . Dementia Mother   . Pulmonary fibrosis Mother   . Alcohol abuse Father   . Aneurysm Father        deceased age 78, brain   . Colon cancer Paternal Grandfather        age greater than 44    Social History   Tobacco Use  . Smoking status: Former Smoker    Packs/day: 0.50    Years: 40.00    Pack years: 20.00    Types: Cigarettes    Quit date: 08/30/2017    Years since quitting: 3.5  . Smokeless tobacco: Never Used  Vaping Use  . Vaping Use: Never used  Substance Use Topics  . Alcohol use: No  . Drug use: No    Home Medications Prior to Admission medications   Medication Sig Start Date End Date Taking? Authorizing Provider  albuterol (VENTOLIN HFA) 108 (90 Base) MCG/ACT inhaler Inhale 2 puffs into the lungs every 4 (four) hours as needed for wheezing or shortness of breath. 11/17/20   Chesley Mires, MD  Alpha-Lipoic Acid 200 MG CAPS Take by mouth.    [provider]  aspirin-acetaminophen-caffeine (EXCEDRIN MIGRAINE) [phone] MG tablet Take 2 tablets by mouth daily as needed for headache. Up to 4 times a week    [provider]  b complex vitamins tablet Take 1 tablet by mouth daily. With Zinc and vitamin C    [provider]  Benzocaine (BOIL-EASE EX) Apply 1 application topically daily as needed (Boil).    [provider]  Biotin 5000 MCG TABS Take 10,000 mcg by mouth daily. Keratin    [provider]  carbamide peroxide (DEBROX) 6.5 % OTIC solution 5 drops daily as needed.    [provider]  Cholecalciferol (VITAMIN D3) 50 MCG (2000 UT) TABS Take 8,000 Units by mouth daily.    [provider]  colestipol (COLESTID) 1 g tablet Take 3 tablets (3 g total) by mouth daily. Do not take within two hours of other medications. 12/14/20   Mahala Menghini, PA-C  Cyanocobalamin (VITAMIN B-12 PO) Take 5,000 mg by mouth. Vit B6 also    [provider]  diazepam (VALIUM) 5 MG tablet Take 5 mg by mouth daily as needed. 1/2 tab qhs    [provider]  diclofenac Sodium (VOLTAREN) 1 % GEL Apply topically 4 (four) times daily.    [provider]  Fluticasone-Umeclidin-Vilant (TRELEGY ELLIPTA) 100-62.5-25 MCG/INH AEPB Inhale 1 puff into the lungs daily. 05/31/20   Chesley Mires,  MD  folic acid (FOLVITE) 1 MG tablet Take by mouth daily. 1,333 mcg QD    [provider]  furosemide (LASIX) 40 MG tablet Take 1 tablet (40 mg total) by mouth 2 (two) times daily. Patient taking differently: Take 40 mg by mouth every 3 (three) days. 12/25/19   Shelly Coss, MD  hydrocortisone cream 1 % Apply 1 application topically daily as needed for itching.    [provider]  Hydrocortisone, Perianal, (PROCTO-PAK) 1 % CREA Apply anorectally twice daily for two weeks. 07/12/20   Mahala Menghini, PA-C  levothyroxine (SYNTHROID) 112 MCG tablet Take 112 mcg by mouth daily. 12/02/20   [provider]  metFORMIN (GLUCOPHAGE) 500 MG tablet Take 1,000 mg by mouth 2 (two) times daily with a meal. Pt has been taking 2tabs BID. 09/18/19   [provider]  Multiple Vitamins-Minerals (MULTIVITAMIN WITH MINERALS) tablet Take 1 tablet by mouth daily. Adult 50+    [provider]  neomycin-bacitracin-polymyxin (NEOSPORIN) OINT Apply 1 application topically daily as needed for irritation or wound care.    [provider]  omega-3 acid ethyl esters (LOVAZA) 1 g capsule Take 2 g by mouth 2 (two) times daily.    [provider]  OVER THE COUNTER MEDICATION Apple Cider Vinegar 448m QD    [provider]  OVER THE COUNTER MEDICATION Code Liver Oil with Vit A and D3 QD    [provider]  oxyCODONE-acetaminophen (PERCOCET/ROXICET) 5-325 MG tablet Take 0.5 tablets by mouth 2 (two) times daily as needed for severe pain. 05/20/20   BCollene Gobble MD  pantoprazole (PROTONIX) 40 MG tablet Take 40 mg by mouth daily. 09/29/19   [provider]  pravastatin (PRAVACHOL) 40 MG tablet Take 40 mg by mouth daily. 07/11/20   [provider]  predniSONE (DELTASONE) 50 MG tablet Take 1  tablet 13 hours before your CT scan, 7 hours before your CT scan, and 1 hour before your CT scan 12/27/20   Heilingoetter, Cassandra L, PA-C  Probiotic Product (PROBIOTIC PO) Take 1 capsule by mouth daily.    [provider]  pyridOXINE (VITAMIN B-6) 50 MG tablet Take 200 mg by mouth daily.    [provider]  TechLite Lancets MRobbins 07/18/20   [provider]  traZODone (DESYREL) 150 MG tablet Take 150 mg by mouth at bedtime. 1/2 tab qhs 08/19/15   [provider]  XARELTO 20 MG TABS tablet Take 20 mg by mouth daily. 11/17/20   [provider]    Allergies    Iohexol, Tape, Ciprofloxacin, Latex, Penicillins, and Povidone-iodine  Review of Systems   Review of Systems  Constitutional: Positive for fever.  HENT: Negative for congestion and sore throat.   Eyes: Negative for visual disturbance.  Respiratory: Negative for cough.   Cardiovascular: Negative for chest pain.  Gastrointestinal: Positive for diarrhea, nausea and vomiting. Negative for abdominal pain.  Genitourinary: Negative for difficulty urinating.  Musculoskeletal: Negative for arthralgias.  Skin: Negative for rash.  Neurological: Positive for light-headedness and headaches. Negative for facial asymmetry and speech difficulty.  Psychiatric/Behavioral: Negative for agitation.  All other systems reviewed and are negative.   Physical Exam Updated Vital Signs BP (!) 135/57   Pulse (!) 59   Temp 98.5 F (36.9 C) (Oral)   Resp 18   SpO2 92%   Physical Exam Vitals and nursing note reviewed.  Constitutional:      General: She is not in acute distress.  Appearance: Normal appearance.  HENT:     Head: Normocephalic and atraumatic.     Nose: Nose normal.     Mouth/Throat:     Mouth: Mucous membranes are moist.     Pharynx: Oropharynx is clear.  Eyes:     Conjunctiva/sclera: Conjunctivae normal.     Pupils: Pupils are equal, round, and reactive to light.  Cardiovascular:      Rate and Rhythm: Normal rate and regular rhythm.     Pulses: Normal pulses.     Heart sounds: Normal heart sounds.  Pulmonary:     Breath sounds: Decreased air movement present.  Chest:     Chest wall: No tenderness.  Abdominal:     General: Abdomen is flat. Bowel sounds are normal.     Palpations: Abdomen is soft.     Tenderness: There is no abdominal tenderness. There is no guarding.  Musculoskeletal:        General: Normal range of motion.     Cervical back: Normal range of motion and neck supple.  Skin:    General: Skin is warm and dry.     Capillary Refill: Capillary refill takes less than 2 seconds.  Neurological:     General: No focal deficit present.     Mental Status: She is alert and oriented to person, place, and time.     Cranial Nerves: No cranial nerve deficit.     Deep Tendon Reflexes: Reflexes normal.     ED Results / Procedures / Treatments   Labs (all labs ordered are listed, but only abnormal results are displayed) Results for orders placed or performed during the hospital encounter of 02/28/21  Lipase, blood  Result Value Ref Range   Lipase 22 11 - 51 U/L  Comprehensive metabolic panel  Result Value Ref Range   Sodium 136 135 - 145 mmol/L   Potassium 4.4 3.5 - 5.1 mmol/L   Chloride 99 98 - 111 mmol/L   CO2 25 22 - 32 mmol/L   Glucose, Bld 138 (H) 70 - 99 mg/dL   BUN 20 8 - 23 mg/dL   Creatinine, Ser 1.00 0.44 - 1.00 mg/dL   Calcium 9.9 8.9 - 10.3 mg/dL   Total Protein 7.4 6.5 - 8.1 g/dL   Albumin 3.7 3.5 - 5.0 g/dL   AST 20 15 - 41 U/L   ALT 12 0 - 44 U/L   Alkaline Phosphatase 44 38 - 126 U/L   Total Bilirubin 0.6 0.3 - 1.2 mg/dL   GFR, Estimated >60 >60 mL/min   Anion gap 12 5 - 15  CBC  Result Value Ref Range   WBC 6.8 4.0 - 10.5 K/uL   RBC 3.76 (L) 3.87 - 5.11 MIL/uL   Hemoglobin 11.7 (L) 12.0 - 15.0 g/dL   HCT 37.2 36.0 - 46.0 %   MCV 98.9 80.0 - 100.0 fL   MCH 31.1 26.0 - 34.0 pg   MCHC 31.5 30.0 - 36.0 g/dL   RDW 13.6 11.5 - 15.5 %    Platelets 213 150 - 400 K/uL   nRBC 0.0 0.0 - 0.2 %  Troponin I (High Sensitivity)  Result Value Ref Range   Troponin I (High Sensitivity) 4 <18 ng/L   CT Head Wo Contrast  Result Date: 02/28/2021 CLINICAL DATA:  Peripheral vertigo EXAM: CT HEAD WITHOUT CONTRAST TECHNIQUE: Contiguous axial images were obtained from the base of the skull through the vertex without intravenous contrast. COMPARISON:  Most recent MRI 12/31/2020 CT 02/07/2015 FINDINGS:  Brain: Marked hypoattenuation in the left cerebellar hemisphere with a volume positive process partially effacing the fourth ventricle extending from the midline cerebellum (4/9) concerning for enlarging underlying metastatic lesion in this patient with history of small cell lung cancer and previously demonstrated cerebellar metastatic focus in this vicinity. No supratentorial mass is CT evident. No evidence of acute infarction, hemorrhage, hydrocephalus. Insert white matter Symmetric prominence of the remaining ventricles, cisterns and sulci compatible with parenchymal volume loss. Vascular: Atherosclerotic calcification of the carotid siphons. No hyperdense vessel. Skull: No calvarial fracture or suspicious osseous lesion. No scalp swelling or hematoma. Sinuses/Orbits: Paranasal sinuses and mastoid air cells are predominantly clear. Prior right lens extraction. Orbital contents are otherwise unremarkable. Other: None. 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 Consider further characterization with a contrast enhanced MRI. These results were called by telephone at the time of interpretation on 02/28/2021 at 6:26 pm to provider [facility] , who verbally acknowledged these results. Electronically Signed   By: Lovena Le M.D.   On: 02/28/2021 18:26   DG Chest Portable 1 View  Result Date: 02/28/2021 CLINICAL DATA:  Headache and dizziness x3 days. EXAM:  PORTABLE CHEST 1 VIEW COMPARISON:  January 09, 2020 FINDINGS: Mild to moderate severity increased interstitial lung markings are seen. In ill-defined focal opacity is seen overlying the right lung base. This is present on the prior study. There is no evidence of a pleural effusion or pneumothorax. The heart size and mediastinal contours are within normal limits. Mild calcification of the aortic arch is noted. The visualized skeletal structures are unremarkable. IMPRESSION: 1. Increased interstitial lung markings which are likely, in part, chronic in nature. A mild superimposed component of interstitial edema cannot be excluded. 2. Findings consistent with the patient's known RIGHT middle lobe pulmonary mass. Electronically Signed   By: Virgina Norfolk M.D.   On: 02/28/2021 17:40    EKG None  Radiology DG Chest Portable 1 View  Result Date: 02/28/2021 CLINICAL DATA:  Headache and dizziness x3 days. EXAM: PORTABLE CHEST 1 VIEW COMPARISON:  January 09, 2020 FINDINGS: Mild to moderate severity increased interstitial lung markings are seen. In ill-defined focal opacity is seen overlying the right lung base. This is present on the prior study. There is no evidence of a pleural effusion or pneumothorax. The heart size and mediastinal contours are within normal limits. Mild calcification of the aortic arch is noted. The visualized skeletal structures are unremarkable. IMPRESSION: 1. Increased interstitial lung markings which are likely, in part, chronic in nature. A mild superimposed component of interstitial edema cannot be excluded. 2. Findings consistent with the patient's known RIGHT middle lobe pulmonary mass. Electronically Signed   By: Virgina Norfolk M.D.   On: 02/28/2021 17:40    Procedures Procedures   Medications Ordered in ED Medications  dexamethasone (DECADRON) injection 10 mg (has no administration in time range)  sodium chloride 0.9 % bolus 500 mL (0 mLs Intravenous Stopped 02/28/21 1813)   ondansetron (ZOFRAN) injection 4 mg (4 mg Intravenous Given 02/28/21 1652)    ED Course  I have reviewed the triage vital signs and the nursing notes.  Pertinent labs & imaging results that were available during my care of the patient were reviewed by me and considered in my medical decision making (see chart for details).   642 case d/w Vinnie Costella via phone, he and Dr. Kathyrn Sheriff have reviewed images MRI with and without.  Steroids are fine.  Final Clinical Impression(s) / ED Diagnoses Final diagnoses:  Nausea vomiting and diarrhea  Lightheadedness  Acute congestive heart failure, unspecified heart failure type [facility])  Brain metastases ([place])   Admit to medicine  Admit to medicine    Clarise Chacko, MD 02/28/21 1842

## 2021-02-28 NOTE — ED Notes (Signed)
Patient transported to MRI 

## 2021-02-28 NOTE — Progress Notes (Signed)
Patient admitted to 6n02 via bed transport.

## 2021-02-28 NOTE — ED Notes (Signed)
RN placed pt on 2L of O2. O2 was 90%

## 2021-02-28 NOTE — ED Triage Notes (Signed)
Pt states headache, dizzy, and vomitting x 3 days.

## 2021-02-28 NOTE — ED Notes (Signed)
Pt is currently in MRI. MRI will take pt to 6N when finished with scan.

## 2021-02-28 NOTE — ED Notes (Signed)
Patient is being discharged from the Urgent Care and sent to the Emergency Department via pov . Per Faustino Congress, FNP, patient is in need of higher level of care due to low o2. Patient is aware and verbalizes understanding of plan of care.  Vitals:   02/28/21 0935  BP: 116/67  Pulse: 77  Resp: 16  Temp: 98.3 F (36.8 C)  SpO2: (!) 85%

## 2021-02-28 NOTE — Progress Notes (Signed)
Patient not in room. Fran Lowes, RN VAST

## 2021-02-28 NOTE — ED Notes (Signed)
RN attempted report x1.  

## 2021-02-28 NOTE — ED Notes (Signed)
Patient transported to CT 

## 2021-03-01 ENCOUNTER — Telehealth: Payer: Self-pay | Admitting: Radiation Therapy

## 2021-03-01 DIAGNOSIS — C342 Malignant neoplasm of middle lobe, bronchus or lung: Secondary | ICD-10-CM | POA: Diagnosis present

## 2021-03-01 DIAGNOSIS — Z20822 Contact with and (suspected) exposure to covid-19: Secondary | ICD-10-CM | POA: Diagnosis present

## 2021-03-01 DIAGNOSIS — E119 Type 2 diabetes mellitus without complications: Secondary | ICD-10-CM | POA: Diagnosis not present

## 2021-03-01 DIAGNOSIS — Z87891 Personal history of nicotine dependence: Secondary | ICD-10-CM | POA: Diagnosis not present

## 2021-03-01 DIAGNOSIS — D649 Anemia, unspecified: Secondary | ICD-10-CM | POA: Diagnosis not present

## 2021-03-01 DIAGNOSIS — N1831 Chronic kidney disease, stage 3a: Secondary | ICD-10-CM | POA: Diagnosis present

## 2021-03-01 DIAGNOSIS — C7951 Secondary malignant neoplasm of bone: Secondary | ICD-10-CM | POA: Diagnosis not present

## 2021-03-01 DIAGNOSIS — Z9221 Personal history of antineoplastic chemotherapy: Secondary | ICD-10-CM | POA: Diagnosis not present

## 2021-03-01 DIAGNOSIS — G9389 Other specified disorders of brain: Secondary | ICD-10-CM | POA: Diagnosis not present

## 2021-03-01 DIAGNOSIS — Z955 Presence of coronary angioplasty implant and graft: Secondary | ICD-10-CM | POA: Diagnosis not present

## 2021-03-01 DIAGNOSIS — G629 Polyneuropathy, unspecified: Secondary | ICD-10-CM | POA: Diagnosis not present

## 2021-03-01 DIAGNOSIS — F419 Anxiety disorder, unspecified: Secondary | ICD-10-CM | POA: Diagnosis present

## 2021-03-01 DIAGNOSIS — J449 Chronic obstructive pulmonary disease, unspecified: Secondary | ICD-10-CM | POA: Diagnosis present

## 2021-03-01 DIAGNOSIS — C7931 Secondary malignant neoplasm of brain: Secondary | ICD-10-CM | POA: Diagnosis present

## 2021-03-01 DIAGNOSIS — Z79899 Other long term (current) drug therapy: Secondary | ICD-10-CM | POA: Diagnosis not present

## 2021-03-01 DIAGNOSIS — I5032 Chronic diastolic (congestive) heart failure: Secondary | ICD-10-CM | POA: Diagnosis present

## 2021-03-01 DIAGNOSIS — I2510 Atherosclerotic heart disease of native coronary artery without angina pectoris: Secondary | ICD-10-CM | POA: Diagnosis present

## 2021-03-01 DIAGNOSIS — M5136 Other intervertebral disc degeneration, lumbar region: Secondary | ICD-10-CM | POA: Diagnosis not present

## 2021-03-01 DIAGNOSIS — G936 Cerebral edema: Secondary | ICD-10-CM | POA: Diagnosis present

## 2021-03-01 DIAGNOSIS — E1122 Type 2 diabetes mellitus with diabetic chronic kidney disease: Secondary | ICD-10-CM | POA: Diagnosis present

## 2021-03-01 DIAGNOSIS — Z87442 Personal history of urinary calculi: Secondary | ICD-10-CM | POA: Diagnosis not present

## 2021-03-01 DIAGNOSIS — Z923 Personal history of irradiation: Secondary | ICD-10-CM | POA: Diagnosis not present

## 2021-03-01 DIAGNOSIS — Z91040 Latex allergy status: Secondary | ICD-10-CM | POA: Diagnosis not present

## 2021-03-01 DIAGNOSIS — E039 Hypothyroidism, unspecified: Secondary | ICD-10-CM | POA: Diagnosis present

## 2021-03-01 DIAGNOSIS — I252 Old myocardial infarction: Secondary | ICD-10-CM | POA: Diagnosis not present

## 2021-03-01 DIAGNOSIS — R42 Dizziness and giddiness: Secondary | ICD-10-CM | POA: Diagnosis present

## 2021-03-01 DIAGNOSIS — I509 Heart failure, unspecified: Secondary | ICD-10-CM | POA: Diagnosis not present

## 2021-03-01 DIAGNOSIS — K219 Gastro-esophageal reflux disease without esophagitis: Secondary | ICD-10-CM | POA: Diagnosis present

## 2021-03-01 DIAGNOSIS — F17210 Nicotine dependence, cigarettes, uncomplicated: Secondary | ICD-10-CM | POA: Diagnosis not present

## 2021-03-01 DIAGNOSIS — Z880 Allergy status to penicillin: Secondary | ICD-10-CM | POA: Diagnosis not present

## 2021-03-01 DIAGNOSIS — Z86718 Personal history of other venous thrombosis and embolism: Secondary | ICD-10-CM | POA: Diagnosis not present

## 2021-03-01 DIAGNOSIS — J9601 Acute respiratory failure with hypoxia: Secondary | ICD-10-CM | POA: Diagnosis present

## 2021-03-01 DIAGNOSIS — I6782 Cerebral ischemia: Secondary | ICD-10-CM | POA: Diagnosis not present

## 2021-03-01 DIAGNOSIS — K7460 Unspecified cirrhosis of liver: Secondary | ICD-10-CM | POA: Diagnosis present

## 2021-03-01 DIAGNOSIS — Z888 Allergy status to other drugs, medicaments and biological substances status: Secondary | ICD-10-CM | POA: Diagnosis not present

## 2021-03-01 DIAGNOSIS — I130 Hypertensive heart and chronic kidney disease with heart failure and stage 1 through stage 4 chronic kidney disease, or unspecified chronic kidney disease: Secondary | ICD-10-CM | POA: Diagnosis present

## 2021-03-01 LAB — CBC
HCT: 35.6 % — ABNORMAL LOW (ref 36.0–46.0)
Hemoglobin: 11.5 g/dL — ABNORMAL LOW (ref 12.0–15.0)
MCH: 31.4 pg (ref 26.0–34.0)
MCHC: 32.3 g/dL (ref 30.0–36.0)
MCV: 97.3 fL (ref 80.0–100.0)
Platelets: 186 10*3/uL (ref 150–400)
RBC: 3.66 MIL/uL — ABNORMAL LOW (ref 3.87–5.11)
RDW: 13.3 % (ref 11.5–15.5)
WBC: 5.2 10*3/uL (ref 4.0–10.5)
nRBC: 0 % (ref 0.0–0.2)

## 2021-03-01 LAB — BASIC METABOLIC PANEL
Anion gap: 9 (ref 5–15)
BUN: 24 mg/dL — ABNORMAL HIGH (ref 8–23)
CO2: 27 mmol/L (ref 22–32)
Calcium: 9.7 mg/dL (ref 8.9–10.3)
Chloride: 100 mmol/L (ref 98–111)
Creatinine, Ser: 1.14 mg/dL — ABNORMAL HIGH (ref 0.44–1.00)
GFR, Estimated: 52 mL/min — ABNORMAL LOW (ref >60)
Glucose, Bld: 112 mg/dL — ABNORMAL HIGH (ref 70–99)
Potassium: 4.2 mmol/L (ref 3.5–5.1)
Sodium: 136 mmol/L (ref 135–145)

## 2021-03-01 LAB — GLUCOSE, CAPILLARY
Glucose-Capillary: 116 mg/dL — ABNORMAL HIGH (ref 70–99)
Glucose-Capillary: 130 mg/dL — ABNORMAL HIGH (ref 70–99)
Glucose-Capillary: 134 mg/dL — ABNORMAL HIGH (ref 70–99)
Glucose-Capillary: 141 mg/dL — ABNORMAL HIGH (ref 70–99)
Glucose-Capillary: 192 mg/dL — ABNORMAL HIGH (ref 70–99)
Glucose-Capillary: 238 mg/dL — ABNORMAL HIGH (ref 70–99)

## 2021-03-01 LAB — HEMOGLOBIN A1C
Hgb A1c MFr Bld: 6.5 % — ABNORMAL HIGH (ref 4.8–5.6)
Mean Plasma Glucose: 139.85 mg/dL

## 2021-03-01 LAB — HIV ANTIBODY (ROUTINE TESTING W REFLEX): HIV Screen 4th Generation wRfx: NONREACTIVE

## 2021-03-01 MED ORDER — FUROSEMIDE 40 MG PO TABS: 40.0000 mg | ORAL_TABLET | Freq: Every day | ORAL | Status: DC

## 2021-03-01 MED ORDER — FENTANYL CITRATE (PF) 100 MCG/2ML IJ SOLN
25.0000 ug | INTRAMUSCULAR | Status: DC | PRN
  Administered 2021-03-01 – 2021-03-03 (×3): 25 ug via INTRAVENOUS
  Filled 2021-03-01 (×3): qty 2

## 2021-03-01 MED ORDER — PAROXETINE HCL 20 MG PO TABS
20.0000 mg | ORAL_TABLET | Freq: Every day | ORAL | Status: DC
  Administered 2021-03-01 – 2021-03-03 (×3): 20 mg via ORAL
  Filled 2021-03-01 (×3): qty 1

## 2021-03-01 MED ORDER — ROSUVASTATIN CALCIUM 20 MG PO TABS: 20.0000 mg | ORAL_TABLET | Freq: Every day | ORAL | Status: DC

## 2021-03-01 MED ORDER — LEVOTHYROXINE SODIUM 112 MCG PO TABS
112.0000 ug | ORAL_TABLET | Freq: Every day | ORAL | Status: DC
  Administered 2021-03-01 – 2021-03-03 (×3): 112 ug via ORAL
  Filled 2021-03-01 (×3): qty 1

## 2021-03-01 MED ORDER — BUTALBITAL-APAP-CAFFEINE 50-325-40 MG PO TABS: 1.0000 | ORAL_TABLET | Freq: Four times a day (QID) | ORAL | Status: DC | PRN

## 2021-03-01 MED ORDER — INSULIN ASPART 100 UNIT/ML IJ SOLN
0.0000 [IU] | Freq: Three times a day (TID) | INTRAMUSCULAR | Status: DC
  Administered 2021-03-01: 3 [IU] via SUBCUTANEOUS
  Administered 2021-03-02 (×2): 2 [IU] via SUBCUTANEOUS
  Administered 2021-03-03: 3 [IU] via SUBCUTANEOUS
  Administered 2021-03-03: 2 [IU] via SUBCUTANEOUS

## 2021-03-01 NOTE — Progress Notes (Addendum)
Histology and Location of Primary Cancer: right lung    Location/Histology of Brain Tumor:  02/28/2021 MR Brain W and Wo Contrast    Patient presented with symptoms of:  Presented to Emergency Room on 02/28/2021 with headache, dizziness & vomiting x 3 days  Past or anticipated interventions, if any, per neurosurgery: no   Past or anticipated interventions, if any, per medical oncology: unknown at this time  Dose of Decadron, if applicable: yes, 72m [id number] Recent neurologic symptoms, if any:   Seizures: no  Headaches: yes  Nausea: yes  Dizziness/ataxia: yes  Difficulty with hand coordination: no  Focal numbness/weakness: no  Visual deficits/changes: no  Confusion/Memory deficits: no  Painful bone metastases at present, if any: no  SAFETY ISSUES:  Prior radiation? yes, to right lung 06/28/2020-08/08/2020  Pacemaker/ICD? no  Possible current pregnancy? no, hysterectomy  Is the patient on methotrexate? no  Additional Complaints / other details: essential tremors in head, chest and left hand are worsening, affecting balance.    There were no vitals filed for this visit. They were taken prior to arrival by [facility].

## 2021-03-01 NOTE — Progress Notes (Signed)
[name]  MLY:[id number] DOB: 1950-11-22 DOA: 02/28/2021 PCP: [name], MD    Brief Narrative:  70 year old with a history of RML small cell lung cancer diagnosed August 8127, chronic diastolic CHF, COPD, cirrhosis, CAD, CKD stage IIIa, anxiety, DM2, and LUE superficial vein thrombosis who presented to the ED with 3 days of HA, N/V, and dizziness.  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  This was confirmed by follow-up MRI. This lesion was known from initial staging imaging, but had responded to systemic chemotherapy in prior f/u studies.   Significant Events:  5/17 admit via ED  Consultants:  Rad Onc Neurosurgery   Code Status: FULL CODE  Antimicrobials:  none   DVT prophylaxis: SCDs  Subjective: Afebrile.  Vital signs stable.  Saturations 95% on 2 L nasal cannula.  Ongoing headache.  Is very hungry.  Denies shortness of breath or chest pain.  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 [name] and Dr. [name] - s/p chemo and XRT to chest - Rad Onc to discuss whole brain XRT w/ pt (she is very concerned about tx related cognitive decline)  Acute hypoxic resp failure - Chronic diastolic CHF - possible pulmonary edema  sats in mid 80s on presentation - has quickly been weaned to RA after simple diuresis - appears to have been related to mild pulmonary edema   CKD Stage 3A Creatinine slightly increased with the use of diuretic -follow trend  COPD Well compensated at present with no wheezing  DM2 CBG presently well controlled despite steroid therapy -A1c 6.5 -monitor with SSI -controlled with only oral medications at home  CAD Asymptomatic  Cirrhosis  Appears well compensated presently  Normocytic anemia Likely simply related to chronic  disease/cancer -check anemia panel   Family Communication:  Status is: Observation  The patient will require care spanning > 2 midnights and should be moved to inpatient because: Inpatient level of care appropriate due to severity of illness  Dispo: The patient is from: Home              Anticipated d/c is to: Home              Patient currently is not medically stable to d/c.   Difficult to place patient No   Objective: Blood pressure (!) 118/58, pulse 60, temperature 97.9 F (36.6 C), temperature source Oral, resp. rate 18, SpO2 95 %.  Intake/Output Summary (Last 24 hours) at 03/01/2021 0755 Last data filed at 02/28/2021 1813 Gross per 24 hour  Intake 500 ml  Output --  Net 500 ml   There were no vitals filed for this visit.  Examination: General: No acute respiratory distress Lungs: Clear to auscultation bilaterally without wheezes or crackles Cardiovascular: Regular rate and rhythm without murmur gallop or rub normal S1 and S2 Abdomen: Nontender, nondistended, soft, bowel sounds positive, no rebound, no ascites, no appreciable mass Extremities: No significant cyanosis, clubbing, or edema bilateral lower extremities  CBC: Recent Labs  Lab 02/28/21 1056 03/01/21 0506  WBC 6.8 5.2  HGB 11.7* 11.5*  HCT 37.2 35.6*  MCV 98.9 97.3  PLT 213 517   Basic Metabolic Panel: Recent Labs  Lab 02/28/21 1056 03/01/21 0506  NA 136 136  K 4.4 4.2  CL 99 100  CO2 25 27  GLUCOSE 138* 112*  BUN 20 24*  CREATININE 1.00 1.14*  CALCIUM 9.9 9.7   GFR: CrCl cannot be calculated (Unknown ideal weight.).  Liver Function Tests: Recent Labs  Lab 02/28/21 1056  AST 20  ALT 12  ALKPHOS 44  BILITOT 0.6  PROT 7.4  ALBUMIN 3.7   Recent Labs  Lab 02/28/21 1056  LIPASE 22    HbA1C: Hgb A1c MFr Bld  Date/Time Value Ref Range Status  03/01/2021 05:06 AM 6.5 (H) 4.8 - 5.6 % Final    Comment:    (NOTE) Pre diabetes:          5.7%-6.4%  Diabetes:               >6.4%  Glycemic control for   <7.0% adults with diabetes   12/20/2019 12:38 PM 8.2 (H) 4.8 - 5.6 % Final    Comment:    (NOTE) Pre diabetes:          5.7%-6.4% Diabetes:              >6.4% Glycemic control for   <7.0% adults with diabetes     CBG: Recent Labs  Lab 02/28/21 2005 03/01/21 0029 03/01/21 0409  GLUCAP 97 192* 134*    Recent Results (from the past 240 hour(s))  Resp Panel by RT-PCR (Flu A&B, Covid) Nasopharyngeal Swab     Status: None   Collection Time: 02/28/21  5:03 PM   Specimen: Nasopharyngeal Swab; Nasopharyngeal(NP) swabs in vial transport medium  Result Value Ref Range Status   SARS Coronavirus 2 by RT PCR NEGATIVE NEGATIVE Final    Comment: (NOTE) SARS-CoV-2 target nucleic acids are NOT DETECTED.  The SARS-CoV-2 RNA is generally detectable in upper respiratory specimens during the acute phase of infection. The lowest concentration of SARS-CoV-2 viral copies this assay can detect is 138 copies/mL. A negative result does not preclude SARS-Cov-2 infection and should not be used as the sole basis for treatment or other patient management decisions. A negative result may occur with  improper specimen collection/handling, submission of specimen other than nasopharyngeal swab, presence of viral mutation(s) within the areas targeted by this assay, and inadequate number of viral copies(<138 copies/mL). A negative result must be combined with clinical observations, patient history, and epidemiological information. The expected result is Negative.  Fact Sheet for Patients:  EntrepreneurPulse.com.au  Fact Sheet for Healthcare Providers:  IncredibleEmployment.be  This test is no t yet approved or cleared by the [place] FDA and  has been authorized for detection and/or diagnosis of SARS-CoV-2 by FDA under an Emergency Use Authorization (EUA). This EUA will remain  in effect (meaning this test can be used) for the  duration of the COVID-19 declaration under Section 564(b)(1) of the Act, 21 U.S.C.section 360bbb-3(b)(1), unless the authorization is terminated  or revoked sooner.       Influenza A by PCR NEGATIVE NEGATIVE Final   Influenza B by PCR NEGATIVE NEGATIVE Final    Comment: (NOTE) The Xpert Xpress SARS-CoV-2/FLU/RSV plus assay is intended as an aid in the diagnosis of influenza from Nasopharyngeal swab specimens and should not be used as a sole basis for treatment. Nasal washings and aspirates are unacceptable for Xpert Xpress SARS-CoV-2/FLU/RSV testing.  Fact Sheet for Patients: EntrepreneurPulse.com.au  Fact Sheet for Healthcare Providers: IncredibleEmployment.be  This test is not yet approved or cleared by the [place] FDA and has been authorized for detection and/or diagnosis of SARS-CoV-2 by FDA under an Emergency Use Authorization (EUA). This EUA will  remain in effect (meaning this test can be used) for the duration of the COVID-19 declaration under Section 564(b)(1) of the Act, 21 U.S.C. section 360bbb-3(b)(1), unless the authorization is terminated or revoked.  Performed at [facility], [place] [address] [place], [place] [address]      Scheduled Meds: . dexamethasone (DECADRON) injection  4 mg Intravenous Q6H  . diazepam  2.5 mg Oral QHS  . fluticasone furoate-vilanterol  1 puff Inhalation Daily   And  . umeclidinium bromide  1 puff Inhalation Daily  . furosemide  40 mg Oral Q3 days  . insulin aspart  0-9 Units Subcutaneous Q4H  . pantoprazole  40 mg Oral Daily  . pravastatin  40 mg Oral q1800  . traZODone  75 mg Oral QHS   Continuous Infusions: . sodium chloride       LOS: 0 days   Cherene Altes, MD [facility] Office  [phone] Pager - Text Page per Amion  If 7PM-7AM, please contact night-coverage per Amion 03/01/2021, 7:55 AM

## 2021-03-01 NOTE — Progress Notes (Addendum)
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 female who is seen as a courtesy of Dr. 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 Today, she is accompanied by good friend.  She continues to be admitted. The patient presented to the ED on 02/28/21 with chief complaint of headache, nausea, vomiting, and dizziness. Upon arrival to the ED, patient was found to be afebrile, saturating mid 80s on room air, with stable blood pressure. Patient reports that she had been in her usual state until 3 days ago when she developed a new headache mostly involving the front of her head, and nausea, vomiting, and dizziness mainly with movement. CT head findings taken while admitted to ED were concerning for recurrent and enlarging left paramedian cerebellar metastasis with vasogenic edema.  MRI of head showed a 2.4 cm contrast-enhancing mass in the peripheral left cerebellar hemisphere with large amount of edema; most consistent with a solitary metastasis. No other lesions.  EKG features a sinus rhythm.  Chest x-ray notable for known RML mass and chronic interstitial markings with possible acute increased interstitial opacities  Patient was initially diagnosed with small cell lung cancer after presenting in August 2021 with right middle lobe mass, adenopathy, and tiny solitary brain metastasis.  She is undergoing chemotherapy and radiotherapy and is  currently being observed by oncology.  Patient had completed her first cycle of chemotherapy on 06/13/2020  and had tolerated it fairly well. Patient completed her next cycle on 08/08/20. On 07/14/20, patient underwent chest CT showing an interval decrease in the size of an FDG mass of the lateral segment of right middle lobe.   Patient received MRI of her head in regards to her Dx of brain metastasis on 09/21/20. Results showed that the previously identified cerebellar lesion is no longer seen. No new abnormality or visible metastatic disease was shown. An MRI performed on 12/31/2020 portrayed  identical findings to that of the MRI performed on 09/21/20, with stable chronic and non-specific white matter changes.  Patient CT on 12/02/20 showed an interval decrease in the volume of the right middle lobe pulmonary mass and stable right hilar para bronchovascular thickening with no evidence of disease progression.  The patient had been offered prophylactic cranial radiation on several occasions but given her fear of dementia she did not wish to proceed with this therapy.  She did however wish to continue having frequent brain MRIs.  Her last MRI prior to the most recent admission showed no evidence of metastatic disease to the brain.  She was scheduled for an additional MRI in July of this year.   PREVIOUS RADIATION THERAPY: yes  Radiation Treatment Dates: 06/28/2020 through 08/08/2020 Site Technique Total Dose (Gy) Dose per Fx (Gy) Completed Fx Beam Energies  Lung, Right: Lung_Rt 3D 60/60 2 30/30 6X, 15X      PAST MEDICAL HISTORY:  Past Medical History:  Diagnosis Date  . Acute on chronic diastolic CHF (congestive heart failure) ([place]) 02/28/2021  .  Allergic rhinitis   . 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 Anemia   . Anxiety   . ASCVD (arteriosclerotic cardiovascular disease)    MI in 96 requiring BMS CX; DES to M1 in 2000;normal coronary angiography in 2004  . Cholelithiasis    . COPD (chronic obstructive pulmonary disease) ([place])   . DDD (degenerative disc disease), lumbar   . Depression   . Diabetes mellitus    Type II  . Diarrhea   . DVT (deep venous thrombosis) ([place])    patient said no  . GERD (gastroesophageal reflux disease)   . Headache    migraine  . History of kidney stones    2006  . History of radiation therapy 06/28/2020-08/08/2020   right Lung; Dr. [name]  . Hyperlipidemia   . Hypertension   . Hypotension   . Hypothyroidism   . Low back pain   . Myocardial infarction ([place]) 01/1995  . Nephrolithiasis 2006   stone extraction   . Panic attacks   . Peripheral neuropathy   . Peripheral vascular disease ([facility])    legs  . Pneumonia 12/22/2019  . Sciatic pain    right  . Small cell lung cancer ([place])   . Tobacco abuse   . Tremor     PAST SURGICAL HISTORY: Past Surgical History:  Procedure Laterality Date  . AGILE CAPSULE N/A 03/07/2020   Procedure: AGILE CAPSULE;  Surgeon: Daneil Dolin, MD;  [facility];  Service: Endoscopy;  Laterality: N/A;  7:30am  . BACK SURGERY     fusion  . BRONCHIAL NEEDLE ASPIRATION BIOPSY N/A 05/20/2020   Procedure: BRONCHIAL NEEDLE ASPIRATION BIOPSIES;  Surgeon: Collene Gobble, MD;  Location: [facility];  Service: Pulmonary;  Laterality: N/A;  . CHOLECYSTECTOMY    . COLONOSCOPY  01/2009   JKD:TOIZTI rectum/repeat in 5 yrs  . COLONOSCOPY N/A 04/29/2014   Dr.Rourk- attempted/incomplete colonoscopy. inadequate prep  . COLONOSCOPY N/A 05/27/2014   WPY:[id number] coli. Colonic polyps-removed as described above.Status post segmental biopsy. single tubular adenoma and random colon bx neg  . COLONOSCOPY WITH PROPOFOL N/A 12/23/2019   Procedure: COLONOSCOPY WITH PROPOFOL;  Surgeon: Ronnette Juniper, MD;  Location: [facility];  Service: Gastroenterology;  Laterality: N/A;  . DILATION AND CURETTAGE OF UTERUS  1974  . ESOPHAGOGASTRODUODENOSCOPY  05/2010   Dr. Tessie Fass, erosion. 60F dilation  .  ESOPHAGOGASTRODUODENOSCOPY N/A 04/29/2014   Dr.Rourk- normal esophagus s/p passage of maloney dilator. small hiatal hernia- bx= chronic inflammation.  . ESOPHAGOGASTRODUODENOSCOPY (EGD) WITH PROPOFOL N/A 12/23/2019   Procedure: ESOPHAGOGASTRODUODENOSCOPY (EGD) WITH PROPOFOL;  Surgeon: Ronnette Juniper, MD;  Location: [facility];  Service: Gastroenterology;  Laterality: N/A;  . GIVENS CAPSULE STUDY N/A 03/22/2020   Procedure: GIVENS CAPSULE STUDY;  Surgeon: Daneil Dolin, MD;  [facility];  Service: Endoscopy;  Laterality: N/A;  7:30am  . HEMOSTASIS CLIP PLACEMENT  12/23/2019   Procedure: HEMOSTASIS CLIP PLACEMENT;  Surgeon: Ronnette Juniper, MD;  Location: [facility];  Service: Gastroenterology;;  . HOT HEMOSTASIS N/A 12/23/2019   Procedure: HOT HEMOSTASIS (ARGON PLASMA COAGULATION/BICAP);  Surgeon: Ronnette Juniper, MD;  Location: [facility];  Service: Gastroenterology;  Laterality: N/A;  . LEFT HEART CATH AND CORONARY ANGIOGRAPHY N/A 12/24/2019   Procedure: LEFT HEART CATH AND CORONARY ANGIOGRAPHY;  Surgeon: Nigel Mormon, MD;  Location: [facility];  Service: Cardiovascular;  Laterality: N/A;  . Venia Minks DILATION N/A 04/29/2014   Procedure: Venia Minks DILATION;  Surgeon: Daneil Dolin, MD;  [facility];  Service: Endoscopy;  Laterality: N/A;  . PARTIAL HYSTERECTOMY  1978  . POLYPECTOMY  12/23/2019   Procedure: POLYPECTOMY;  Surgeon: Ronnette Juniper, MD;  Location: [facility] ENDOSCOPY;  Service: Gastroenterology;;  . SHOULDER SURGERY Left    Left shoulder for RTC;left arm surgery '98/left hand surgery 2001  . TOTAL ABDOMINAL HYSTERECTOMY W/ BILATERAL SALPINGOOPHORECTOMY  2002  . UMBILICAL HERNIA REPAIR  2008  . VIDEO BRONCHOSCOPY WITH ENDOBRONCHIAL ULTRASOUND N/A 05/20/2020   Procedure: VIDEO BRONCHOSCOPY WITH ENDOBRONCHIAL ULTRASOUND;  Surgeon: Collene Gobble, MD;  Location: [facility] ENDOSCOPY;  Service: Pulmonary;  Laterality: N/A;    FAMILY HISTORY:  Family History  Problem Relation  Age of Onset  . Depression Mother   . Bipolar disorder Mother   . Dementia Mother   . Pulmonary fibrosis Mother   . Alcohol abuse Father   . Aneurysm Father        deceased age 4, brain  . Colon cancer Paternal Grandfather        age greater than 69    SOCIAL HISTORY:  Social History   Tobacco Use  . Smoking status: Former Smoker    Packs/day: 0.50    Years: 40.00    Pack years: 20.00    Types: Cigarettes    Quit date: 08/30/2017    Years since quitting: 3.5  . Smokeless tobacco: Never Used  Vaping Use  . Vaping Use: Never used  Substance Use Topics  . Alcohol use: No  . Drug use: No    ALLERGIES:  Allergies  Allergen Reactions  . Iohexol Shortness Of Breath and Other (See Comments)    CHEST TIGHTNESS and BREATHING PROBLEMS- NEEDS PRE-MEDS 13 hours beforehand-  premeds given 06/08/2020 without incident   . Tape Other (See Comments)    Tears the skin!!  . Betadine [Povidone Iodine] Rash  . Ciprofloxacin Rash  . Latex Rash and Other (See Comments)    NO latex gloves!!   . Penicillins Swelling    Did it involve swelling of the face/tongue/throat, SOB, or low BP? Yes Did it involve sudden or severe rash/hives, skin peeling, or any reaction on the inside of your mouth or nose? No Did you need to seek medical attention at a hospital or doctor's office? Yes When did it last happen?1996 If all above answers are "NO", may proceed with cephalosporin use.  . Povidone-Iodine Rash    MEDICATIONS:  No current facility-administered medications for this encounter.   No current outpatient medications on file.   Facility-Administered Medications Ordered in Other Encounters  Medication Dose Route Frequency Provider Last Rate Last Admin  . acetaminophen (TYLENOL) tablet 650 mg  650 mg Oral Q6H PRN Opyd, Timothy S, MD      . albuterol (PROVENTIL) (2.5 MG/3ML) 0.083% nebulizer solution 2.5 mg  2.5 mg Inhalation Q4H PRN Opyd, Ilene Qua, MD      .  butalbital-acetaminophen-caffeine (FIORICET) 50-325-40 MG per tablet 1 tablet  1 tablet Oral Q6H PRN Cherene Altes, MD      . dexamethasone (DECADRON) injection 4 mg  4 mg Intravenous Q6H Opyd, Ilene Qua, MD   4 mg at 03/02/21 1739  . diazepam (VALIUM) tablet 2.5 mg  2.5 mg Oral QHS Opyd, Ilene Qua, MD   2.5 mg at 03/01/21 2120  . fentaNYL (SUBLIMAZE) injection 25-50 mcg  25-50 mcg Intravenous Q2H PRN Cherene Altes, MD   25 mcg at 03/01/21 2121  . fluticasone furoate-vilanterol (BREO ELLIPTA) 100-25 MCG/INH 1 puff  1 puff Inhalation Daily Opyd, Ilene Qua, MD   1 puff at 03/01/21 1028   And  . umeclidinium bromide (INCRUSE ELLIPTA) 62.5 MCG/INH 1 puff  1 puff Inhalation Daily Opyd, Ilene Qua, MD   1 puff at 03/01/21 1028  . furosemide (LASIX) tablet 40 mg  40 mg Oral Daily Joette Catching T, MD      . insulin aspart (novoLOG) injection 0-9 Units  0-9 Units Subcutaneous TID WC Cherene Altes, MD   2 Units at 03/02/21 1746  . levothyroxine (SYNTHROID) tablet 112 mcg  112 mcg Oral QAC breakfast Cherene Altes, MD   112 mcg at 03/02/21 0630  . meclizine (ANTIVERT) tablet 25 mg  25 mg Oral TID PRN Opyd, Ilene Qua, MD      . ondansetron (ZOFRAN) tablet 4 mg  4 mg Oral Q6H PRN Opyd, Ilene Qua, MD       Or  . ondansetron (ZOFRAN) injection 4 mg  4 mg Intravenous Q6H PRN Opyd, Ilene Qua, MD      . pantoprazole (PROTONIX) EC tablet 40 mg  40 mg Oral Daily Opyd, Ilene Qua, MD   40 mg at 03/02/21 0924  . PARoxetine (PAXIL) tablet 20 mg  20 mg Oral Daily Cherene Altes, MD   20 mg at 03/02/21 0924  . pravastatin (PRAVACHOL) tablet 40 mg  40 mg Oral q1800 Opyd, Ilene Qua, MD   40 mg at 03/02/21 1738  . traZODone (DESYREL) tablet 75 mg  75 mg Oral QHS Opyd, Ilene Qua, MD   75 mg at 03/01/21 2120    REVIEW OF SYSTEMS:  REVIEW OF SYSTEMS: A 10+ POINT REVIEW OF SYSTEMS WAS OBTAINED including neurology, dermatology, psychiatry, cardiac, respiratory, lymph, extremities, GI, GU,  musculoskeletal, constitutional, reproductive, HEENT. All pertinent positives are noted in the HPI. All others are negative.   PHYSICAL EXAM:  General: Alert and oriented, in no acute distress, lying in hospital bed.  She shows minimal confusion at this time HEENT: Head is normocephalic. Extraocular movements are intact. Oropharynx is clear.   Neck: Neck is supple, no palpable cervical or supraclavicular lymphadenopathy. Heart: Regular in rate and rhythm with no murmurs, rubs, or gallops. Chest: Clear to auscultation bilaterally, with no rhonchi, wheezes, or rales. Abdomen: Soft, nontender, nondistended, with no rigidity or guarding. Extremities: No cyanosis or edema. Lymphatics: see Neck Exam Skin: No concerning lesions. Musculoskeletal: symmetric strength and muscle tone throughout.  Patient experienced a traumatic amputation of her right hand and wrist.  She wears a dressing over this area to protect the area Neurologic: Cranial nerves II through XII are grossly intact. No obvious focalities. Speech is fluent.  Walking not tested at this time. Psychiatric: Judgment and insight are intact. Affect is appropriate.    ECOG = 3  0 - Asymptomatic (Fully active, able to carry on all predisease activities without restriction)  1 - Symptomatic but completely ambulatory (Restricted in physically strenuous activity but ambulatory and able to carry out work of a light or sedentary nature. For example, light housework, office work)  2 - Symptomatic, <50% in bed during the day (Ambulatory and capable of all self care but unable to carry out any work activities. Up and about more than 50% of waking hours)  3 - Symptomatic, >50% in bed, but not bedbound (Capable of only limited self-care, confined to bed or chair 50% or more of waking hours)  4 - Bedbound (Completely disabled. Cannot carry on any self-care. Totally confined to bed  or chair)  5 - Death   Eustace Pen MM, Creech RH, Tormey DC, et al.  [phone]). "Toxicity and response criteria of the [facility]". [place] Oncol. 5 (6): 649-55  LABORATORY DATA:  Lab Results  Component Value Date   WBC 8.0 03/02/2021   HGB 11.1 (L) 03/02/2021   HCT 34.2 (L) 03/02/2021   MCV 96.1 03/02/2021   PLT 201 03/02/2021   NEUTROABS 3.1 12/19/2020   Lab Results  Component Value Date   NA 136 03/02/2021   K 4.5 03/02/2021   CL 100 03/02/2021   CO2 27 03/02/2021   GLUCOSE 155 (H) 03/02/2021   CREATININE 1.21 (H) 03/02/2021   CALCIUM 9.2 03/02/2021      RADIOGRAPHY: CT Head Wo Contrast  Result Date: 02/28/2021 CLINICAL DATA:  Peripheral vertigo EXAM: CT HEAD WITHOUT CONTRAST TECHNIQUE: Contiguous axial images were obtained from the base of the skull through the vertex without intravenous contrast. COMPARISON:  Most recent MRI 12/31/2020 CT 02/07/2015 FINDINGS: Brain: Marked hypoattenuation in the left cerebellar hemisphere with a volume positive process partially effacing the fourth ventricle extending from the midline cerebellum (4/9) concerning for enlarging underlying metastatic lesion in this patient with history of small cell lung cancer and previously demonstrated cerebellar metastatic focus in this vicinity. No supratentorial mass is CT evident. No evidence of acute infarction, hemorrhage, hydrocephalus. Insert white matter Symmetric prominence of the remaining ventricles, cisterns and sulci compatible with parenchymal volume loss. Vascular: Atherosclerotic calcification of the carotid siphons. No hyperdense vessel. Skull: No calvarial fracture or suspicious osseous lesion. No scalp swelling or hematoma. Sinuses/Orbits: Paranasal sinuses and mastoid air cells are predominantly clear. Prior right lens extraction. Orbital contents are otherwise unremarkable. Other: None. 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 Consider  further characterization with a contrast enhanced MRI. These results were called by telephone at the time of interpretation on 02/28/2021 at 6:26 pm to provider [facility] , who verbally acknowledged these results. Electronically Signed   By: Lovena Le M.D.   On: 02/28/2021 18:26   MR Brain W and Wo Contrast  Result Date: 02/28/2021 CLINICAL DATA:  Vertigo EXAM: MRI HEAD WITHOUT AND WITH CONTRAST TECHNIQUE: Multiplanar, multiecho pulse sequences of the brain and surrounding structures were obtained without and with intravenous contrast. CONTRAST:  6.61m GADAVIST GADOBUTROL 1 MMOL/ML IV SOLN COMPARISON:  Head CT 02/28/2021 FINDINGS: Brain: No acute infarct, mass effect or extra-axial collection. No acute or chronic hemorrhage. There is multifocal hyperintense T2-weighted signal within the white matter. Parenchymal volume and CSF spaces are normal. Large amount of edema in the left cerebellar hemisphere. 2.4 cm contrast-enhancing mass in the peripheral left cerebellar hemisphere. The midline structures are normal. Vascular: Major flow voids are preserved. Skull and upper cervical spine: Normal calvarium and skull base. Visualized upper cervical spine and soft tissues are normal. Sinuses/Orbits:No paranasal sinus fluid levels or advanced mucosal thickening. No mastoid or middle ear effusion. Normal orbits. IMPRESSION: 1. 2.4 cm contrast-enhancing mass in the peripheral left cerebellar hemisphere with large amount of edema. This is most consistent with a solitary metastasis. No other lesions. 2. Findings of chronic microvascular ischemia. Electronically Signed   By: KUlyses JarredM.D.   On: 02/28/2021 22:14   DG Chest Portable 1 View  Result Date: 02/28/2021 CLINICAL DATA:  Headache and dizziness x3 days. EXAM: PORTABLE CHEST 1 VIEW COMPARISON:  January 09, 2020 FINDINGS: Mild to moderate severity increased interstitial lung markings  are seen. In ill-defined focal opacity is seen overlying the right lung  base. This is present on the prior study. There is no evidence of a pleural effusion or pneumothorax. The heart size and mediastinal contours are within normal limits. Mild calcification of the aortic arch is noted. The visualized skeletal structures are unremarkable. IMPRESSION: 1. Increased interstitial lung markings which are likely, in part, chronic in nature. A mild superimposed component of interstitial edema cannot be excluded. 2. Findings consistent with the patient's known RIGHT middle lobe pulmonary mass. Electronically Signed   By: Virgina Norfolk M.D.   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  Given the diagnosis of small cell lung cancer SRS is not recommended as the initial brain treatment given the significant risk for metastatic disease to other sites of the brain.   PLAN: Patient will undergo CT simulation later today with her first treatment later this afternoon.  She will continue daily treatments while she is inpatient and continue radiation therapy as an outpatient once discharged.  She will receive 14 treatments to the brain.  Total time spent in this encounter was 45 minutes which included reviewing the patient's most recent ED visits, hospital admissions, consultations, follow-ups, chest x-rays, CT scans, PET scan, procedures, biopsies, pathology reports, chemotherapy, physical examination, and documentation.  ------------------------------------------------  Blair Promise, PhD, MD  This document serves as a record of services personally performed by [name], MD. It was created on his behalf by Roney Mans, a trained medical scribe. The creation of this record is based on the scribe's personal observations and the  provider's statements to them. This document has been checked and approved by the attending provider.

## 2021-03-01 NOTE — Progress Notes (Incomplete)
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 female who is seen as a courtesy of Dr. 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 Today, she is accompanied by ***. The patient presented to the ED on 02/28/21 with chief complaint of headache, nausea, vomiting, and dizziness. Upon arrival to the ED, patient was found to be afebrile, saturating mid 80s on room air, with stable blood pressure. Patient reports that she had been in her usual state until 3 days ago when she developed a new headache mostly involving the front of her head, and nausea, vomiting, and dizziness mainly with movement. CT head findings taken while admitted to ED were concerning for recurrent and enlarging left paramedian cerebellar metastasis with vasogenic edema.  MRI of head showed a 2.4 cm contrast-enhancing mass in the peripheral left cerebellar hemisphere with large amount of edema; most consistent with a solitary metastasis. No other lesions.  EKG features a sinus rhythm.  Chest x-ray notable for known RML mass and chronic interstitial markings with possible acute increased interstitial opacities  Patient was initially diagnosed with small cell lung cancer after presenting in August 2021 with right middle lobe mass, adenopathy, and tiny solitary brain metastasis.  She is undergoing chemotherapy and radiotherapy and is currently being observed by  oncology.  Patient had completed her first cycle of chemotherapy on 06/13/2020  and had tolerated it fairly well. Patient completed her next cycle on 08/08/20. On 07/14/20, patient underwent chest CT showing an interval decrease in the size of an FDG mass of the lateral segment of right middle lobe.   Patient received MRI of her head in regards to her Dx of brain metastasis on 09/21/20. Results showed that the previously identified cerebellar lesion is no longer seen. No new abnormality or visible metastatic disease was shown. An MRI performed on 12/31/2020 portrayed  identical findings to that of the MRI performed on 09/21/20, with stable chronic and non-specific white matter changes.  Patient CT on 12/02/20 showed an interval decrease in the volume of the right middle lobe pulmonary mass and stable right hilar para bronchovascular thickening with no evidence of disease progression.  On 02/28/21, patient went to an Urgent Care due to low O2 (O2 stat. of 80%) and was then transferred to [facility] ED due to need of higher level care.She reported headache, nausea, vomiting and dizziness.   PREVIOUS RADIATION THERAPY: No  PAST MEDICAL HISTORY:  Past Medical History:  Diagnosis Date  . Acute on chronic diastolic CHF (congestive heart failure) ([place]) 02/28/2021  . Allergic rhinitis   . 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 Anemia   . Anxiety   . ASCVD (arteriosclerotic cardiovascular disease)    MI in 96 requiring BMS CX; DES to M1 in 2000;normal coronary angiography in 2004  . Cholelithiasis   . COPD (chronic obstructive pulmonary disease) ([place])   . DDD (degenerative disc disease), lumbar   . Depression   .  Diabetes mellitus    Type II  . Diarrhea   . DVT (deep venous thrombosis) ([place])    patient said no  . GERD (gastroesophageal reflux disease)   . Headache    migraine  . History of kidney stones    2006  . History of radiation therapy 06/28/2020-08/08/2020   right Lung; Dr.  [name]  . Hyperlipidemia   . Hypertension   . Hypotension   . Hypothyroidism   . Low back pain   . Myocardial infarction ([place]) 01/1995  . Nephrolithiasis 2006   stone extraction   . Panic attacks   . Peripheral neuropathy   . Peripheral vascular disease ([facility])    legs  . Pneumonia 12/22/2019  . Sciatic pain    right  . Small cell lung cancer ([place])   . Tobacco abuse   . Tremor     PAST SURGICAL HISTORY: Past Surgical History:  Procedure Laterality Date  . AGILE CAPSULE N/A 03/07/2020   Procedure: AGILE CAPSULE;  Surgeon: Daneil Dolin, MD;  [facility];  Service: Endoscopy;  Laterality: N/A;  7:30am  . BACK SURGERY     fusion  . BRONCHIAL NEEDLE ASPIRATION BIOPSY N/A 05/20/2020   Procedure: BRONCHIAL NEEDLE ASPIRATION BIOPSIES;  Surgeon: Collene Gobble, MD;  Location: [facility] ENDOSCOPY;  Service: Pulmonary;  Laterality: N/A;  . CHOLECYSTECTOMY    . COLONOSCOPY  01/2009   EQA:STMHDQ rectum/repeat in 5 yrs  . COLONOSCOPY N/A 04/29/2014   Dr.Rourk- attempted/incomplete colonoscopy. inadequate prep  . COLONOSCOPY N/A 05/27/2014   QIW:[id number] coli. Colonic polyps-removed as described above.Status post segmental biopsy. single tubular adenoma and random colon bx neg  . COLONOSCOPY WITH PROPOFOL N/A 12/23/2019   Procedure: COLONOSCOPY WITH PROPOFOL;  Surgeon: Ronnette Juniper, MD;  Location: [place];  Service: Gastroenterology;  Laterality: N/A;  . DILATION AND CURETTAGE OF UTERUS  1974  . ESOPHAGOGASTRODUODENOSCOPY  05/2010   Dr. Tessie Fass, erosion. 27F dilation  . ESOPHAGOGASTRODUODENOSCOPY N/A 04/29/2014   Dr.Rourk- normal esophagus s/p passage of maloney dilator. small hiatal hernia- bx= chronic inflammation.  . ESOPHAGOGASTRODUODENOSCOPY (EGD) WITH PROPOFOL N/A 12/23/2019   Procedure: ESOPHAGOGASTRODUODENOSCOPY (EGD) WITH PROPOFOL;  Surgeon: Ronnette Juniper, MD;  Location: [place];  Service: Gastroenterology;  Laterality: N/A;  . GIVENS CAPSULE STUDY N/A 03/22/2020    Procedure: GIVENS CAPSULE STUDY;  Surgeon: Daneil Dolin, MD;  [facility];  Service: Endoscopy;  Laterality: N/A;  7:30am  . HEMOSTASIS CLIP PLACEMENT  12/23/2019   Procedure: HEMOSTASIS CLIP PLACEMENT;  Surgeon: Ronnette Juniper, MD;  Location: [place];  Service: Gastroenterology;;  . HOT HEMOSTASIS N/A 12/23/2019   Procedure: HOT HEMOSTASIS (ARGON PLASMA COAGULATION/BICAP);  Surgeon: Ronnette Juniper, MD;  Location: [place];  Service: Gastroenterology;  Laterality: N/A;  . LEFT HEART CATH AND CORONARY ANGIOGRAPHY N/A 12/24/2019   Procedure: LEFT HEART CATH AND CORONARY ANGIOGRAPHY;  Surgeon: Nigel Mormon, MD;  Location: [facility];  Service: Cardiovascular;  Laterality: N/A;  . Venia Minks DILATION N/A 04/29/2014   Procedure: Venia Minks DILATION;  Surgeon: Daneil Dolin, MD;  [facility];  Service: Endoscopy;  Laterality: N/A;  . PARTIAL HYSTERECTOMY  1978  . POLYPECTOMY  12/23/2019   Procedure: POLYPECTOMY;  Surgeon: Ronnette Juniper, MD;  Location: [facility];  Service: Gastroenterology;;  . SHOULDER SURGERY Left    Left shoulder for RTC;left arm surgery '98/left hand surgery 2001  . TOTAL ABDOMINAL HYSTERECTOMY W/ BILATERAL SALPINGOOPHORECTOMY  2002  . UMBILICAL HERNIA REPAIR  2008  .  VIDEO BRONCHOSCOPY WITH ENDOBRONCHIAL ULTRASOUND N/A 05/20/2020   Procedure: VIDEO BRONCHOSCOPY WITH ENDOBRONCHIAL ULTRASOUND;  Surgeon: Collene Gobble, MD;  Location: [facility] ENDOSCOPY;  Service: Pulmonary;  Laterality: N/A;    FAMILY HISTORY:  Family History  Problem Relation Age of Onset  . Depression Mother   . Bipolar disorder Mother   . Dementia Mother   . Pulmonary fibrosis Mother   . Alcohol abuse Father   . Aneurysm Father        deceased age 7, brain  . Colon cancer Paternal Grandfather        age greater than 39    SOCIAL HISTORY:  Social History   Tobacco Use  . Smoking status: Former Smoker    Packs/day: 0.50    Years: 40.00    Pack years: 20.00     Types: Cigarettes    Quit date: 08/30/2017    Years since quitting: 3.5  . Smokeless tobacco: Never Used  Vaping Use  . Vaping Use: Never used  Substance Use Topics  . Alcohol use: No  . Drug use: No    ALLERGIES:  Allergies  Allergen Reactions  . Iohexol Shortness Of Breath and Other (See Comments)    CHEST TIGHTNESS and BREATHING PROBLEMS- NEEDS PRE-MEDS 13 hours beforehand-  premeds given 06/08/2020 without incident   . Tape Other (See Comments)    Tears the skin!!  . Betadine [Povidone Iodine] Rash  . Ciprofloxacin Rash  . Latex Rash and Other (See Comments)    NO latex gloves!!   . Penicillins Swelling    Did it involve swelling of the face/tongue/throat, SOB, or low BP? Yes Did it involve sudden or severe rash/hives, skin peeling, or any reaction on the inside of your mouth or nose? No Did you need to seek medical attention at a hospital or doctor's office? Yes When did it last happen?1996 If all above answers are "NO", may proceed with cephalosporin use.  . Povidone-Iodine Rash    MEDICATIONS:  No current facility-administered medications for this encounter.   No current outpatient medications on file.   Facility-Administered Medications Ordered in Other Encounters  Medication Dose Route Frequency Provider Last Rate Last Admin  . 0.9 %  sodium chloride infusion  250 mL Intravenous PRN Opyd, Ilene Qua, MD      . acetaminophen (TYLENOL) tablet 650 mg  650 mg Oral Q6H PRN Opyd, Ilene Qua, MD      . albuterol (PROVENTIL) (2.5 MG/3ML) 0.083% nebulizer solution 2.5 mg  2.5 mg Inhalation Q4H PRN Opyd, Ilene Qua, MD      . butalbital-acetaminophen-caffeine (FIORICET) 50-325-40 MG per tablet 1 tablet  1 tablet Oral Q6H PRN Cherene Altes, MD      . dexamethasone (DECADRON) injection 4 mg  4 mg Intravenous Q6H Opyd, Ilene Qua, MD   4 mg at 03/01/21 1751  . diazepam (VALIUM) tablet 2.5 mg  2.5 mg Oral QHS Opyd, Ilene Qua, MD   2.5 mg at 03/01/21 2120  . fentaNYL  (SUBLIMAZE) injection 25-50 mcg  25-50 mcg Intravenous Q2H PRN Cherene Altes, MD   25 mcg at 03/01/21 2121  . fluticasone furoate-vilanterol (BREO ELLIPTA) 100-25 MCG/INH 1 puff  1 puff Inhalation Daily Opyd, Ilene Qua, MD   1 puff at 03/01/21 1028   And  . umeclidinium bromide (INCRUSE ELLIPTA) 62.5 MCG/INH 1 puff  1 puff Inhalation Daily Opyd, Ilene Qua, MD   1 puff at 03/01/21 1028  . [START ON 03/02/2021] furosemide (LASIX) tablet  40 mg  40 mg Oral Daily Joette Catching T, MD      . insulin aspart (novoLOG) injection 0-9 Units  0-9 Units Subcutaneous TID WC Cherene Altes, MD   3 Units at 03/01/21 1750  . levothyroxine (SYNTHROID) tablet 112 mcg  112 mcg Oral QAC breakfast Cherene Altes, MD   112 mcg at 03/01/21 1027  . meclizine (ANTIVERT) tablet 25 mg  25 mg Oral TID PRN Opyd, Ilene Qua, MD      . ondansetron (ZOFRAN) tablet 4 mg  4 mg Oral Q6H PRN Opyd, Ilene Qua, MD       Or  . ondansetron (ZOFRAN) injection 4 mg  4 mg Intravenous Q6H PRN Opyd, Ilene Qua, MD      . pantoprazole (PROTONIX) EC tablet 40 mg  40 mg Oral Daily Opyd, Ilene Qua, MD   40 mg at 03/01/21 1028  . PARoxetine (PAXIL) tablet 20 mg  20 mg Oral Daily Cherene Altes, MD   20 mg at 03/01/21 1543  . pravastatin (PRAVACHOL) tablet 40 mg  40 mg Oral q1800 Opyd, Ilene Qua, MD   40 mg at 03/01/21 1751  . traZODone (DESYREL) tablet 75 mg  75 mg Oral QHS Opyd, Ilene Qua, MD   75 mg at 03/01/21 2120    REVIEW OF SYSTEMS:  REVIEW OF SYSTEMS: A 10+ POINT REVIEW OF SYSTEMS WAS OBTAINED including neurology, dermatology, psychiatry, cardiac, respiratory, lymph, extremities, GI, GU, musculoskeletal, constitutional, reproductive, HEENT. All pertinent positives are noted in the HPI. All others are negative.  *** PHYSICAL EXAM:  vitals were not taken for this visit.     General: Alert and oriented, in no acute distress HEENT: Head is normocephalic. Extraocular movements are intact. Oropharynx is clear.   Edentulous Neck: Neck is supple, no palpable cervical or supraclavicular lymphadenopathy. Heart: Regular in rate and rhythm with no murmurs, rubs, or gallops. Chest: Clear to auscultation bilaterally, with no rhonchi, wheezes, or rales. Abdomen: Soft, nontender, nondistended, with no rigidity or guarding. Extremities: No cyanosis or edema. Lymphatics: see Neck Exam Skin: No concerning lesions. Musculoskeletal: symmetric strength and muscle tone throughout.  Patient experienced a traumatic amputation of her right hand and wrist.  She wears a dressing over this area to protect the area Neurologic: Cranial nerves II through XII are grossly intact. No obvious focalities. Speech is fluent. Coordination is intact. Psychiatric: Judgment and insight are intact. Affect is appropriate.   ECOG = 1  0 - Asymptomatic (Fully active, able to carry on all predisease activities without restriction)  1 - Symptomatic but completely ambulatory (Restricted in physically strenuous activity but ambulatory and able to carry out work of a light or sedentary nature. For example, light housework, office work)  2 - Symptomatic, <50% in bed during the day (Ambulatory and capable of all self care but unable to carry out any work activities. Up and about more than 50% of waking hours)  3 - Symptomatic, >50% in bed, but not bedbound (Capable of only limited self-care, confined to bed or chair 50% or more of waking hours)  4 - Bedbound (Completely disabled. Cannot carry on any self-care. Totally confined to bed or chair)  5 - Death   Eustace Pen MM, Creech RH, Tormey DC, et al. [phone]). "Toxicity and response criteria of the [facility]". [place] Oncol. 5 (6): 649-55  LABORATORY DATA:  Lab Results  Component Value Date   WBC 5.2 03/01/2021   HGB 11.5 (L) 03/01/2021  HCT 35.6 (L) 03/01/2021   MCV 97.3 03/01/2021   PLT 186 03/01/2021   NEUTROABS 3.1 12/19/2020   Lab Results  Component Value  Date   NA 136 03/01/2021   K 4.2 03/01/2021   CL 100 03/01/2021   CO2 27 03/01/2021   GLUCOSE 112 (H) 03/01/2021   CREATININE 1.14 (H) 03/01/2021   CALCIUM 9.7 03/01/2021      RADIOGRAPHY: CT Head Wo Contrast  Result Date: 02/28/2021 CLINICAL DATA:  Peripheral vertigo EXAM: CT HEAD WITHOUT CONTRAST TECHNIQUE: Contiguous axial images were obtained from the base of the skull through the vertex without intravenous contrast. COMPARISON:  Most recent MRI 12/31/2020 CT 02/07/2015 FINDINGS: Brain: Marked hypoattenuation in the left cerebellar hemisphere with a volume positive process partially effacing the fourth ventricle extending from the midline cerebellum (4/9) concerning for enlarging underlying metastatic lesion in this patient with history of small cell lung cancer and previously demonstrated cerebellar metastatic focus in this vicinity. No supratentorial mass is CT evident. No evidence of acute infarction, hemorrhage, hydrocephalus. Insert white matter Symmetric prominence of the remaining ventricles, cisterns and sulci compatible with parenchymal volume loss. Vascular: Atherosclerotic calcification of the carotid siphons. No hyperdense vessel. Skull: No calvarial fracture or suspicious osseous lesion. No scalp swelling or hematoma. Sinuses/Orbits: Paranasal sinuses and mastoid air cells are predominantly clear. Prior right lens extraction. Orbital contents are otherwise unremarkable. Other: None. 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 Consider further characterization with a contrast enhanced MRI. These results were called by telephone at the time of interpretation on 02/28/2021 at 6:26 pm to provider [facility] , who verbally acknowledged these results. Electronically Signed   By: Lovena Le M.D.   On: 02/28/2021 18:26   MR Brain W and Wo Contrast  Result Date: 02/28/2021 CLINICAL DATA:   Vertigo EXAM: MRI HEAD WITHOUT AND WITH CONTRAST TECHNIQUE: Multiplanar, multiecho pulse sequences of the brain and surrounding structures were obtained without and with intravenous contrast. CONTRAST:  6.67m GADAVIST GADOBUTROL 1 MMOL/ML IV SOLN COMPARISON:  Head CT 02/28/2021 FINDINGS: Brain: No acute infarct, mass effect or extra-axial collection. No acute or chronic hemorrhage. There is multifocal hyperintense T2-weighted signal within the white matter. Parenchymal volume and CSF spaces are normal. Large amount of edema in the left cerebellar hemisphere. 2.4 cm contrast-enhancing mass in the peripheral left cerebellar hemisphere. The midline structures are normal. Vascular: Major flow voids are preserved. Skull and upper cervical spine: Normal calvarium and skull base. Visualized upper cervical spine and soft tissues are normal. Sinuses/Orbits:No paranasal sinus fluid levels or advanced mucosal thickening. No mastoid or middle ear effusion. Normal orbits. IMPRESSION: 1. 2.4 cm contrast-enhancing mass in the peripheral left cerebellar hemisphere with large amount of edema. This is most consistent with a solitary metastasis. No other lesions. 2. Findings of chronic microvascular ischemia. Electronically Signed   By: KUlyses JarredM.D.   On: 02/28/2021 22:14   DG Chest Portable 1 View  Result Date: 02/28/2021 CLINICAL DATA:  Headache and dizziness x3 days. EXAM: PORTABLE CHEST 1 VIEW COMPARISON:  January 09, 2020 FINDINGS: Mild to moderate severity increased interstitial lung markings are seen. In ill-defined focal opacity is seen overlying the right lung base. This is present on the prior study. There is no evidence of a pleural effusion or pneumothorax. The heart size and mediastinal contours are within normal limits. Mild calcification of the aortic arch is noted. The visualized skeletal structures are unremarkable. IMPRESSION: 1. Increased  interstitial lung markings which are likely, in part, chronic in  nature. A mild superimposed component of interstitial edema cannot be excluded. 2. Findings consistent with the patient's known RIGHT middle lobe pulmonary mass. Electronically Signed   By: Virgina Norfolk M.D.   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  Given her relatively young age and performance status she would be a candidate for aggressive therapy including radiation treatments to the chest along with chemotherapy as above.  Given the minimal metastatic disease in the brain we will hold off on whole brain radiation therapy and reassess with another MRI of the brain after she has completed her chemotherapy.  Given the patient's memory issues already she is hesitant to consider prophylactic cranial radiation should she become in remission.  Today, I talked to the patient and friend about the findings and work-up thus far.  We discussed the natural history of small cell lung cancer and general treatment, highlighting the role of radiotherapy in the management.  We discussed the available radiation techniques, and focused on the details of logistics and delivery.  We reviewed the anticipated acute and late sequelae associated with radiation in this setting.  The patient was encouraged to ask questions that I answered to the best of my ability.  A patient consent form was discussed and signed.  We retained a copy for our records.  The patient would like to proceed with radiation and will be scheduled for CT simulation.  PLAN: CT simulation next week with treatments to begin approximately 2 weeks from now, concomitant with her second cycle of chemotherapy.  Total time spent in this encounter was 60 minutes which included reviewing the patient's most recent  ED visits, hospital admissions, consultations, follow-ups, chest x-rays, CT scans, PET scan, procedures, biopsies, pathology reports, chemotherapy, physical examination, and documentation.  ------------------------------------------------  Blair Promise, PhD, MD  This document serves as a record of services personally performed by [name], MD. It was created on his behalf by Clerance Lav, a trained medical scribe. The creation of this record is based on the scribe's personal observations and the provider's statements to them. This document has been checked and approved by the attending provider.

## 2021-03-01 NOTE — Telephone Encounter (Addendum)
Dr. Kathyrn Sheriff called to make [place] aware of Ms. Malloy's admission and to request a radiation oncology consult. Ms. Nanni was previously treated by Dr. [name] for her small cell lung cancer. At the time of her lung treatment she was not interested in PCI because of the potential side effects.She asked that we reserve this if the need presented itself in the future. I called Ms. Coppolino to discuss moving forward with whole brain radiation now, due to the new finding on her recent brain MRI. She understands that this new 2.4 cm lesion is causing her undesired symptoms and is willing to proceed with whole brain radiation treatment planning. We discussed bringing her over to our department via [place] on Thursday 5/19, to meet with Dr. [name], followed by simulation. I spoke with the patient's nurse, Payson, about arranging transportation for this. Anabelle is going to share this with the patient's case manager for arrangements.   Please arrange [facility] transportation for Thursday 5/19 Arrive at [place] area at 9:45am.    Mont Dutton R.T.(R)(T) Radiation Special Procedures Navigator [phone]

## 2021-03-01 NOTE — Consult Note (Signed)
Chief Complaint   Chief Complaint  Patient presents with  . Emesis  . Dizziness  . Headache    History of Present Illness  [name] is a 70 y.o. female with a history of metastatic small-cell lung CA. Upon her initial diagnosis in 2021 she had a small cerebellar metastasis. She was hesitant to undergo PCI due to potential for cognitive side effect and she was treated with lung radiation and systemic therapy. Her initial cerebellar met resolved with systemic treatment however she has presented again yesterday with about 3 days of HA, N/V. She also c/o dizziness. No new N/T/W in the extremities. Initial w/u in the ED included CT demonstrating left cerebellar lesion with edema and neurosurgical consult was requested.   Past Medical History   Past Medical History:  Diagnosis Date  . Acute on chronic diastolic CHF (congestive heart failure) ([place]) 02/28/2021  . Allergic rhinitis   . 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 Anemia   . Anxiety   . ASCVD (arteriosclerotic cardiovascular disease)    MI in 96 requiring BMS CX; DES to M1 in 2000;normal coronary angiography in 2004  . Cholelithiasis   . COPD (chronic obstructive pulmonary disease) ([place])   . DDD (degenerative disc disease), lumbar   . Depression   . Diabetes mellitus    Type II  . Diarrhea   . DVT (deep venous thrombosis) ([place])    patient said no  . GERD (gastroesophageal reflux disease)   . Headache    migraine  . History of kidney stones    2006  . History of radiation therapy 06/28/2020-08/08/2020   right Lung; Dr. [name]  . Hyperlipidemia   . Hypertension   . Hypotension   . Hypothyroidism   . Low back pain   . Myocardial infarction ([place]) 01/1995  . Nephrolithiasis 2006   stone extraction   . Panic attacks   . Peripheral neuropathy   . Peripheral vascular disease ([facility])    legs  . Pneumonia 12/22/2019  . Sciatic pain    right  . Small cell lung cancer ([place])   . Tobacco abuse   .  Tremor     Past Surgical History   Past Surgical History:  Procedure Laterality Date  . AGILE CAPSULE N/A 03/07/2020   Procedure: AGILE CAPSULE;  Surgeon: Daneil Dolin, MD;  [facility];  Service: Endoscopy;  Laterality: N/A;  7:30am  . BACK SURGERY     fusion  . BRONCHIAL NEEDLE ASPIRATION BIOPSY N/A 05/20/2020   Procedure: BRONCHIAL NEEDLE ASPIRATION BIOPSIES;  Surgeon: Collene Gobble, MD;  Location: [facility] END[facility];  Service: Pulmonary;  Laterality: N/A;  . CHOLECYSTECTOMY    . COLONOSCOPY  01/2009   UXL:[id number] rectum/repeat in 5 yrs  . COLONOSCOPY N/A 04/29/2014   Dr.Rourk- attempted/incomplete colonoscopy. inadequate prep  . COLONOSCOPY N/A 05/27/2014   UVO:[id number] coli. Colonic polyps-removed as described above.Status post segmental biopsy. single tubular adenoma and random colon bx neg  . COLONOSCOPY WITH PROPOFOL N/A 12/23/2019   Procedure: COLONOSCOPY WITH PROPOFOL;  Surgeon: Ronnette Juniper, MD;  Location: [place];  Service: Gastroenterology;  Laterality: N/A;  . DILATION AND CURETTAGE OF UTERUS  1974  . ESOPHAGOGASTRODUODENOSCOPY  05/2010   Dr. Tessie Fass, erosion. 53F dilation  . ESOPHAGOGASTRODUODENOSCOPY N/A 04/29/2014   Dr.Rourk- normal esophagus s/p passage of maloney dilator. small hiatal hernia- bx= chronic inflammation.  . ESOPHAGOGASTRODUODENOSCOPY (EGD) WITH PROPOFOL N/A 12/23/2019   Procedure: ESOPHAGOGASTRODUODENOSCOPY (EGD) WITH PROPOFOL;  Surgeon: Ronnette Juniper, MD;  Location: [place];  Service: Gastroenterology;  Laterality: N/A;  . GIVENS CAPSULE STUDY N/A 03/22/2020   Procedure: GIVENS CAPSULE STUDY;  Surgeon: Daneil Dolin, MD;  [facility];  Service: Endoscopy;  Laterality: N/A;  7:30am  . HEMOSTASIS CLIP PLACEMENT  12/23/2019   Procedure: HEMOSTASIS CLIP PLACEMENT;  Surgeon: Ronnette Juniper, MD;  Location: [place];  Service: Gastroenterology;;  . HOT HEMOSTASIS N/A 12/23/2019   Procedure: HOT HEMOSTASIS (ARGON PLASMA  COAGULATION/BICAP);  Surgeon: Ronnette Juniper, MD;  Location: [place];  Service: Gastroenterology;  Laterality: N/A;  . LEFT HEART CATH AND CORONARY ANGIOGRAPHY N/A 12/24/2019   Procedure: LEFT HEART CATH AND CORONARY ANGIOGRAPHY;  Surgeon: Nigel Mormon, MD;  Location: [place] [facility];  Service: Cardiovascular;  Laterality: N/A;  . Venia Minks DILATION N/A 04/29/2014   Procedure: Venia Minks DILATION;  Surgeon: Daneil Dolin, MD;  [facility];  Service: Endoscopy;  Laterality: N/A;  . PARTIAL HYSTERECTOMY  1978  . POLYPECTOMY  12/23/2019   Procedure: POLYPECTOMY;  Surgeon: Ronnette Juniper, MD;  Location: [facility] ENDOSCOPY;  Service: Gastroenterology;;  . SHOULDER SURGERY Left    Left shoulder for RTC;left arm surgery '98/left hand surgery 2001  . TOTAL ABDOMINAL HYSTERECTOMY W/ BILATERAL SALPINGOOPHORECTOMY  2002  . UMBILICAL HERNIA REPAIR  2008  . VIDEO BRONCHOSCOPY WITH ENDOBRONCHIAL ULTRASOUND N/A 05/20/2020   Procedure: VIDEO BRONCHOSCOPY WITH ENDOBRONCHIAL ULTRASOUND;  Surgeon: Collene Gobble, MD;  Location: [facility] ENDOSCOPY;  Service: Pulmonary;  Laterality: N/A;    Social History   Social History   Tobacco Use  . Smoking status: Former Smoker    Packs/day: 0.50    Years: 40.00    Pack years: 20.00    Types: Cigarettes    Quit date: 08/30/2017    Years since quitting: 3.5  . Smokeless tobacco: Never Used  Vaping Use  . Vaping Use: Never used  Substance Use Topics  . Alcohol use: No  . Drug use: No    Medications   Prior to Admission medications   Medication Sig Start Date End Date Taking? Authorizing Provider  albuterol (VENTOLIN HFA) 108 (90 Base) MCG/ACT inhaler Inhale 2 puffs into the lungs every 4 (four) hours as needed for wheezing or shortness of breath. 11/17/20   Chesley Mires, MD  Alpha-Lipoic Acid 200 MG CAPS Take by mouth.    [provider]  aspirin-acetaminophen-caffeine (EXCEDRIN MIGRAINE) [phone] MG tablet Take 2 tablets by mouth daily as needed  for headache. Up to 4 times a week    [provider]  b complex vitamins tablet Take 1 tablet by mouth daily. With Zinc and vitamin C    [provider]  Benzocaine (BOIL-EASE EX) Apply 1 application topically daily as needed (Boil).    [provider]  Biotin 5000 MCG TABS Take 10,000 mcg by mouth daily. Keratin    [provider]  carbamide peroxide (DEBROX) 6.5 % OTIC solution 5 drops daily as needed.    [provider]  Cholecalciferol (VITAMIN D3) 50 MCG (2000 UT) TABS Take 8,000 Units by mouth daily.    [provider]  colestipol (COLESTID) 1 g tablet Take 3 tablets (3 g total) by mouth daily. Do not take within two hours of other medications. 12/14/20   Mahala Menghini, PA-C  Cyanocobalamin (VITAMIN B-12 PO) Take 5,000 mg by mouth. Vit B6 also    [provider]  diazepam (VALIUM) 5 MG tablet Take 5 mg by  mouth daily as needed. 1/2 tab qhs    [provider]  diclofenac Sodium (VOLTAREN) 1 % GEL Apply topically 4 (four) times daily.    [provider]  Fluticasone-Umeclidin-Vilant (TRELEGY ELLIPTA) 100-62.5-25 MCG/INH AEPB Inhale 1 puff into the lungs daily. 05/31/20   Chesley Mires, MD  folic acid (FOLVITE) 1 MG tablet Take by mouth daily. 1,333 mcg QD    [provider]  furosemide (LASIX) 40 MG tablet Take 1 tablet (40 mg total) by mouth 2 (two) times daily. Patient taking differently: Take 40 mg by mouth every 3 (three) days. 12/25/19   Shelly Coss, MD  hydrocortisone cream 1 % Apply 1 application topically daily as needed for itching.    [provider]  Hydrocortisone, Perianal, (PROCTO-PAK) 1 % CREA Apply anorectally twice daily for two weeks. 07/12/20   Mahala Menghini, PA-C  levothyroxine (SYNTHROID) 112 MCG tablet Take 112 mcg by mouth daily. 12/02/20   [provider]  metFORMIN (GLUCOPHAGE) 500 MG tablet Take 1,000 mg by mouth 2 (two) times daily with a meal. Pt has been  taking 2tabs BID. 09/18/19   [provider]  Multiple Vitamins-Minerals (MULTIVITAMIN WITH MINERALS) tablet Take 1 tablet by mouth daily. Adult 50+    [provider]  neomycin-bacitracin-polymyxin (NEOSPORIN) OINT Apply 1 application topically daily as needed for irritation or wound care.    [provider]  omega-3 acid ethyl esters (LOVAZA) 1 g capsule Take 2 g by mouth 2 (two) times daily.    [provider]  OVER THE COUNTER MEDICATION Apple Cider Vinegar 471m QD    [provider]  OVER THE COUNTER MEDICATION Code Liver Oil with Vit A and D3 QD    [provider]  oxyCODONE-acetaminophen (PERCOCET/ROXICET) 5-325 MG tablet Take 0.5 tablets by mouth 2 (two) times daily as needed for severe pain. 05/20/20   BCollene Gobble MD  pantoprazole (PROTONIX) 40 MG tablet Take 40 mg by mouth daily. 09/29/19   [provider]  pravastatin (PRAVACHOL) 40 MG tablet Take 40 mg by mouth daily. 07/11/20   [provider]  predniSONE (DELTASONE) 50 MG tablet Take 1 tablet 13 hours before your CT scan, 7 hours before your CT scan, and 1 hour before your CT scan 12/27/20   Heilingoetter, Cassandra L, PA-C  Probiotic Product (PROBIOTIC PO) Take 1 capsule by mouth daily.    [provider]  pyridOXINE (VITAMIN B-6) 50 MG tablet Take 200 mg by mouth daily.    [provider]  TechLite Lancets MLa Villita 07/18/20   [provider]  traZODone (DESYREL) 150 MG tablet Take 150 mg by mouth at bedtime. 1/2 tab qhs 08/19/15   [provider]  XARELTO 20 MG TABS tablet Take 20 mg by mouth daily. 11/17/20   [provider]    Allergies   Allergies  Allergen Reactions  . Iohexol Shortness Of Breath and Other (See Comments)    CHEST TIGHTNESS and BREATHING PROBLEMS- NEEDS PRE-MEDS 13 hours beforehand-  premeds given 06/08/2020 without incident   . Tape Other (See Comments)    Tears the skin!!  . Betadine [Povidone  Iodine] Rash  . Ciprofloxacin Rash  . Latex Rash and Other (See Comments)    NO latex gloves!!   . Penicillins Swelling    Did it involve swelling of the face/tongue/throat, SOB, or low BP? Yes Did it involve sudden or severe rash/hives, skin peeling, or any reaction on the inside of your  mouth or nose? No Did you need to seek medical attention at a hospital or doctor's office? Yes When did it last happen?1996 If all above answers are "NO", may proceed with cephalosporin use.  . Povidone-Iodine Rash    Review of Systems  ROS  Neurologic Exam  Awake, alert, oriented Memory and concentration grossly intact Speech fluent, appropriate CN grossly intact Motor exam: Upper Extremities Deltoid Bicep Tricep Grip  Right 5/5 5/5 5/5 5/5  Left 5/5 5/5 5/5 5/5   Lower Extremities IP Quad PF DF EHL  Right 5/5 5/5 5/5 5/5 5/5  Left 5/5 5/5 5/5 5/5 5/5   Sensation grossly intact to LT  Imaging  Her current brain imaging including CT and contrasted MRI demonstrates a left superficial cerebellar lobar metastasis, possible dural based, with significant surrounding edema and partial effacement of the 4th ventricle. No HCP.  Impression  - 70 y.o. female with small call lung CA presenting with new left cerebellar metastasis and surrounding edema. She elected to forgo PCI upon her diagnosis.  Plan  - No role for surgical treatment of this lesion - Would continued dexamethasone 53m q6hrs. - I have spoken with rad onc coordinator, patient will likely need radiation treatment of the new lesion, SRS may be an option.  I have reviewed the situation with the patient. We discussed the role of radiation in the treatment of small cell lung cancer and the fact that surgical treatment is not indicated. All her questions were answered.  NConsuella Lose MD [facility]

## 2021-03-02 ENCOUNTER — Ambulatory Visit
Admit: 2021-03-02 | Discharge: 2021-03-02 | Disposition: A | Discharge status: Home / Self Care | Payer: Medicare HMO | Source: Ambulatory Visit | Attending: Radiation Oncology | Admitting: Radiation Oncology

## 2021-03-02 ENCOUNTER — Encounter: Payer: Self-pay | Admitting: Radiation Oncology

## 2021-03-02 ENCOUNTER — Ambulatory Visit
Admit: 2021-03-02 | Discharge: 2021-03-02 | Disposition: A | Discharge status: Home / Self Care | Payer: Medicare HMO | Attending: Radiation Oncology | Admitting: Radiation Oncology

## 2021-03-02 ENCOUNTER — Ambulatory Visit
Admission: RE | Admit: 2021-03-02 | Discharge: 2021-03-02 | Disposition: A | Discharge status: Home / Self Care | Payer: Medicare HMO | Source: Ambulatory Visit | Attending: Radiation Oncology | Admitting: Radiation Oncology

## 2021-03-02 DIAGNOSIS — F17210 Nicotine dependence, cigarettes, uncomplicated: Secondary | ICD-10-CM | POA: Insufficient documentation

## 2021-03-02 DIAGNOSIS — E039 Hypothyroidism, unspecified: Secondary | ICD-10-CM | POA: Insufficient documentation

## 2021-03-02 DIAGNOSIS — Z923 Personal history of irradiation: Secondary | ICD-10-CM | POA: Insufficient documentation

## 2021-03-02 DIAGNOSIS — C7951 Secondary malignant neoplasm of bone: Secondary | ICD-10-CM | POA: Insufficient documentation

## 2021-03-02 DIAGNOSIS — C342 Malignant neoplasm of middle lobe, bronchus or lung: Secondary | ICD-10-CM | POA: Diagnosis not present

## 2021-03-02 DIAGNOSIS — J449 Chronic obstructive pulmonary disease, unspecified: Secondary | ICD-10-CM | POA: Diagnosis not present

## 2021-03-02 DIAGNOSIS — Z87891 Personal history of nicotine dependence: Secondary | ICD-10-CM | POA: Insufficient documentation

## 2021-03-02 DIAGNOSIS — E119 Type 2 diabetes mellitus without complications: Secondary | ICD-10-CM | POA: Insufficient documentation

## 2021-03-02 DIAGNOSIS — D649 Anemia, unspecified: Secondary | ICD-10-CM | POA: Diagnosis not present

## 2021-03-02 DIAGNOSIS — Z510 Encounter for antineoplastic radiation therapy: Secondary | ICD-10-CM | POA: Insufficient documentation

## 2021-03-02 DIAGNOSIS — I2510 Atherosclerotic heart disease of native coronary artery without angina pectoris: Secondary | ICD-10-CM | POA: Insufficient documentation

## 2021-03-02 DIAGNOSIS — I6782 Cerebral ischemia: Secondary | ICD-10-CM | POA: Insufficient documentation

## 2021-03-02 DIAGNOSIS — C7931 Secondary malignant neoplasm of brain: Secondary | ICD-10-CM | POA: Diagnosis not present

## 2021-03-02 DIAGNOSIS — Z9221 Personal history of antineoplastic chemotherapy: Secondary | ICD-10-CM | POA: Diagnosis not present

## 2021-03-02 DIAGNOSIS — I509 Heart failure, unspecified: Secondary | ICD-10-CM | POA: Diagnosis not present

## 2021-03-02 DIAGNOSIS — I252 Old myocardial infarction: Secondary | ICD-10-CM | POA: Insufficient documentation

## 2021-03-02 DIAGNOSIS — Z87442 Personal history of urinary calculi: Secondary | ICD-10-CM | POA: Insufficient documentation

## 2021-03-02 DIAGNOSIS — M5136 Other intervertebral disc degeneration, lumbar region: Secondary | ICD-10-CM | POA: Insufficient documentation

## 2021-03-02 DIAGNOSIS — Z86718 Personal history of other venous thrombosis and embolism: Secondary | ICD-10-CM | POA: Insufficient documentation

## 2021-03-02 DIAGNOSIS — G629 Polyneuropathy, unspecified: Secondary | ICD-10-CM | POA: Insufficient documentation

## 2021-03-02 DIAGNOSIS — Z79899 Other long term (current) drug therapy: Secondary | ICD-10-CM | POA: Insufficient documentation

## 2021-03-02 DIAGNOSIS — K219 Gastro-esophageal reflux disease without esophagitis: Secondary | ICD-10-CM | POA: Insufficient documentation

## 2021-03-02 LAB — IRON AND TIBC
Iron: 34 ug/dL (ref 28–170)
Saturation Ratios: 7 % — ABNORMAL LOW (ref 10.4–31.8)
TIBC: 475 ug/dL — ABNORMAL HIGH (ref 250–450)
UIBC: 441 ug/dL

## 2021-03-02 LAB — COMPREHENSIVE METABOLIC PANEL
ALT: 18 U/L (ref 0–44)
AST: 28 U/L (ref 15–41)
Albumin: 3.5 g/dL (ref 3.5–5.0)
Alkaline Phosphatase: 37 U/L — ABNORMAL LOW (ref 38–126)
Anion gap: 9 (ref 5–15)
BUN: 33 mg/dL — ABNORMAL HIGH (ref 8–23)
CO2: 27 mmol/L (ref 22–32)
Calcium: 9.2 mg/dL (ref 8.9–10.3)
Chloride: 100 mmol/L (ref 98–111)
Creatinine, Ser: 1.21 mg/dL — ABNORMAL HIGH (ref 0.44–1.00)
GFR, Estimated: 49 mL/min — ABNORMAL LOW (ref >60)
Glucose, Bld: 155 mg/dL — ABNORMAL HIGH (ref 70–99)
Potassium: 4.5 mmol/L (ref 3.5–5.1)
Sodium: 136 mmol/L (ref 135–145)
Total Bilirubin: 0.4 mg/dL (ref 0.3–1.2)
Total Protein: 7 g/dL (ref 6.5–8.1)

## 2021-03-02 LAB — RETICULOCYTES
Immature Retic Fract: 15.8 % (ref 2.3–15.9)
RBC.: 3.54 MIL/uL — ABNORMAL LOW (ref 3.87–5.11)
Retic Count, Absolute: 57.7 10*3/uL (ref 19.0–186.0)
Retic Ct Pct: 1.6 % (ref 0.4–3.1)

## 2021-03-02 LAB — CBC
HCT: 34.2 % — ABNORMAL LOW (ref 36.0–46.0)
Hemoglobin: 11.1 g/dL — ABNORMAL LOW (ref 12.0–15.0)
MCH: 31.2 pg (ref 26.0–34.0)
MCHC: 32.5 g/dL (ref 30.0–36.0)
MCV: 96.1 fL (ref 80.0–100.0)
Platelets: 201 10*3/uL (ref 150–400)
RBC: 3.56 MIL/uL — ABNORMAL LOW (ref 3.87–5.11)
RDW: 13.2 % (ref 11.5–15.5)
WBC: 8 10*3/uL (ref 4.0–10.5)
nRBC: 0 % (ref 0.0–0.2)

## 2021-03-02 LAB — FOLATE: Folate: 31.7 ng/mL (ref >5.9)

## 2021-03-02 LAB — MAGNESIUM: Magnesium: 1.8 mg/dL (ref 1.7–2.4)

## 2021-03-02 LAB — FERRITIN: Ferritin: 68 ng/mL (ref 11–307)

## 2021-03-02 LAB — VITAMIN B12: Vitamin B-12: 1753 pg/mL — ABNORMAL HIGH (ref 180–914)

## 2021-03-02 LAB — GLUCOSE, CAPILLARY
Glucose-Capillary: 165 mg/dL — ABNORMAL HIGH (ref 70–99)
Glucose-Capillary: 171 mg/dL — ABNORMAL HIGH (ref 70–99)
Glucose-Capillary: 248 mg/dL — ABNORMAL HIGH (ref 70–99)

## 2021-03-02 MED ORDER — FUROSEMIDE 40 MG PO TABS
40.0000 mg | ORAL_TABLET | Freq: Every day | ORAL | Status: DC
  Administered 2021-03-02 – 2021-03-03 (×2): 40 mg via ORAL
  Filled 2021-03-02 (×2): qty 1

## 2021-03-02 NOTE — Progress Notes (Signed)
See MD note for nursing evaluation. °

## 2021-03-02 NOTE — Progress Notes (Signed)
[name]  VQX:[id number] DOB: [birth year] DOA: 02/28/2021 PCP: [name], MD    Brief Narrative:  [phone] with a history of RML small cell lung cancer diagnosed August 9179, chronic diastolic CHF, COPD, cirrhosis, CAD, CKD stage IIIa, anxiety, DM2, and LUE superficial vein thrombosis who presented to the ED with 3 days of HA, N/V, and dizziness.  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  This was confirmed by follow-up MRI. This lesion was known from initial staging imaging, but had responded to systemic chemotherapy in prior f/u studies.   Significant Events:  5/17 admit via ED  Consultants:  [facility]   Code Status: FULL CODE  Antimicrobials:  none   DVT prophylaxis: SCDs  Subjective: Patient has been under the care of Radiation Oncology at [facility] for the majority of the day.  Her chart has been reviewed in detail.  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 [name] and Dr. [name] - s/p chemo and XRT to chest - Rad Onc to initiate whole brain XRT today  Acute hypoxic resp failure - Chronic diastolic CHF - possible pulmonary edema  sats in mid 80s on presentation - has quickly been weaned to RA after simple diuresis - appears to have been related to mild pulmonary edema - monitor trend    CKD Stage 3A Creatinine slightly increased with the use of diuretic -follow trend  Recent Labs  Lab 02/28/21 1056 03/01/21 0506 03/02/21 0259  CREATININE 1.00 1.14* 1.21*    COPD Well compensated with no wheezing  DM2 CBG controlled despite steroid therapy - A1c 6.5 - monitor with SSI -controlled with oral medications at home  CAD Asymptomatic  Cirrhosis  Appears well compensated presently  Normocytic anemia Likely simply related to  chronic disease/cancer - Fe low normal, but TIBC elevated - ferritin 68 - monitor for now w/o specific tx    Family Communication:  Status is: Observation  The patient will require care spanning > 2 midnights and should be moved to inpatient because: Inpatient level of care appropriate due to severity of illness  Dispo: The patient is from: Home              Anticipated d/c is to: Home              Patient currently is not medically stable to d/c.   Difficult to place patient No   Objective: Blood pressure (!) 132/53, pulse (!) 54, temperature 98.1 F (36.7 C), temperature source Oral, resp. rate 17, SpO2 94 %.  Intake/Output Summary (Last 24 hours) at 03/02/2021 0829 Last data filed at 03/01/2021 1718 Gross per 24 hour  Intake 780 ml  Output --  Net 780 ml   There were no vitals filed for this visit.  Examination: Pt off campus today  CBC: Recent Labs  Lab 02/28/21 1056 03/01/21 0506 03/02/21 0259  WBC 6.8 5.2 8.0  HGB 11.7* 11.5* 11.1*  HCT 37.2 35.6* 34.2*  MCV 98.9 97.3 96.1  PLT 213 186 150   Basic Metabolic Panel: Recent Labs  Lab 02/28/21 1056 03/01/21 0506 03/02/21 0259  NA 136 136 136  K 4.4 4.2 4.5  CL 99 100 100  CO2 25 27 27   GLUCOSE 138* 112* 155*  BUN 20 24* 33*  CREATININE 1.00  1.14* 1.21*  CALCIUM 9.9 9.7 9.2  MG  --   --  1.8   GFR: CrCl cannot be calculated (Unknown ideal weight.).  Liver Function Tests: Recent Labs  Lab 02/28/21 1056 03/02/21 0259  AST 20 28  ALT 12 18  ALKPHOS 44 37*  BILITOT 0.6 0.4  PROT 7.4 7.0  ALBUMIN 3.7 3.5   Recent Labs  Lab 02/28/21 1056  LIPASE 22    HbA1C: Hgb A1c MFr Bld  Date/Time Value Ref Range Status  03/01/2021 05:06 AM 6.5 (H) 4.8 - 5.6 % Final    Comment:    (NOTE) Pre diabetes:          5.7%-6.4%  Diabetes:              >6.4%  Glycemic control for   <7.0% adults with diabetes   12/20/2019 12:38 PM 8.2 (H) 4.8 - 5.6 % Final    Comment:    (NOTE) Pre diabetes:           5.7%-6.4% Diabetes:              >6.4% Glycemic control for   <7.0% adults with diabetes     CBG: Recent Labs  Lab 03/01/21 0804 03/01/21 1204 03/01/21 1717 03/01/21 2041 03/02/21 0801  GLUCAP 130* 116* 238* 141* 165*    Recent Results (from the past 240 hour(s))  Resp Panel by RT-PCR (Flu A&B, Covid) Nasopharyngeal Swab     Status: None   Collection Time: 02/28/21  5:03 PM   Specimen: Nasopharyngeal Swab; Nasopharyngeal(NP) swabs in vial transport medium  Result Value Ref Range Status   SARS Coronavirus 2 by RT PCR NEGATIVE NEGATIVE Final    Comment: (NOTE) SARS-CoV-2 target nucleic acids are NOT DETECTED.  The SARS-CoV-2 RNA is generally detectable in upper respiratory specimens during the acute phase of infection. The lowest concentration of SARS-CoV-2 viral copies this assay can detect is 138 copies/mL. A negative result does not preclude SARS-Cov-2 infection and should not be used as the sole basis for treatment or other patient management decisions. A negative result may occur with  improper specimen collection/handling, submission of specimen other than nasopharyngeal swab, presence of viral mutation(s) within the areas targeted by this assay, and inadequate number of viral copies(<138 copies/mL). A negative result must be combined with clinical observations, patient history, and epidemiological information. The expected result is Negative.  Fact Sheet for Patients:  EntrepreneurPulse.com.au  Fact Sheet for Healthcare Providers:  IncredibleEmployment.be  This test is no t yet approved or cleared by the [place] FDA and  has been authorized for detection and/or diagnosis of SARS-CoV-2 by FDA under an Emergency Use Authorization (EUA). This EUA will remain  in effect (meaning this test can be used) for the duration of the COVID-19 declaration under Section 564(b)(1) of the Act, 21 U.S.C.section 360bbb-3(b)(1), unless the  authorization is terminated  or revoked sooner.       Influenza A by PCR NEGATIVE NEGATIVE Final   Influenza B by PCR NEGATIVE NEGATIVE Final    Comment: (NOTE) The Xpert Xpress SARS-CoV-2/FLU/RSV plus assay is intended as an aid in the diagnosis of influenza from Nasopharyngeal swab specimens and should not be used as a sole basis for treatment. Nasal washings and aspirates are unacceptable for Xpert Xpress SARS-CoV-2/FLU/RSV testing.  Fact Sheet for Patients: EntrepreneurPulse.com.au  Fact Sheet for Healthcare Providers: IncredibleEmployment.be  This test is not yet approved or cleared by the [place] FDA and has been authorized for detection  and/or diagnosis of SARS-CoV-2 by FDA under an Emergency Use Authorization (EUA). This EUA will remain in effect (meaning this test can be used) for the duration of the COVID-19 declaration under Section 564(b)(1) of the Act, 21 U.S.C. section 360bbb-3(b)(1), unless the authorization is terminated or revoked.  Performed at [facility], [place] [address] [place], [place] [address]      Scheduled Meds: . dexamethasone (DECADRON) injection  4 mg Intravenous Q6H  . diazepam  2.5 mg Oral QHS  . fluticasone furoate-vilanterol  1 puff Inhalation Daily   And  . umeclidinium bromide  1 puff Inhalation Daily  . furosemide  40 mg Oral Daily  . insulin aspart  0-9 Units Subcutaneous TID WC  . levothyroxine  112 mcg Oral QAC breakfast  . pantoprazole  40 mg Oral Daily  . PARoxetine  20 mg Oral Daily  . pravastatin  40 mg Oral q1800  . traZODone  75 mg Oral QHS   Continuous Infusions: . sodium chloride       LOS: 1 day   Cherene Altes, MD [facility] Office  [phone] Pager - Text Page per Amion  If 7PM-7AM, please contact night-coverage per Amion 03/02/2021, 8:29 AM

## 2021-03-03 ENCOUNTER — Inpatient Hospital Stay (HOSPITAL_COMMUNITY): Payer: Medicare HMO

## 2021-03-03 ENCOUNTER — Ambulatory Visit
Admit: 2021-03-03 | Discharge: 2021-03-03 | Disposition: A | Discharge status: Home / Self Care | Payer: Medicare HMO | Attending: Radiation Oncology | Admitting: Radiation Oncology

## 2021-03-03 LAB — BASIC METABOLIC PANEL
Anion gap: 8 (ref 5–15)
BUN: 37 mg/dL — ABNORMAL HIGH (ref 8–23)
CO2: 26 mmol/L (ref 22–32)
Calcium: 9 mg/dL (ref 8.9–10.3)
Chloride: 98 mmol/L (ref 98–111)
Creatinine, Ser: 1.14 mg/dL — ABNORMAL HIGH (ref 0.44–1.00)
GFR, Estimated: 52 mL/min — ABNORMAL LOW (ref >60)
Glucose, Bld: 152 mg/dL — ABNORMAL HIGH (ref 70–99)
Potassium: 5.4 mmol/L — ABNORMAL HIGH (ref 3.5–5.1)
Sodium: 132 mmol/L — ABNORMAL LOW (ref 135–145)

## 2021-03-03 LAB — CBC
HCT: 34.6 % — ABNORMAL LOW (ref 36.0–46.0)
Hemoglobin: 11.4 g/dL — ABNORMAL LOW (ref 12.0–15.0)
MCH: 31.8 pg (ref 26.0–34.0)
MCHC: 32.9 g/dL (ref 30.0–36.0)
MCV: 96.6 fL (ref 80.0–100.0)
Platelets: 214 10*3/uL (ref 150–400)
RBC: 3.58 MIL/uL — ABNORMAL LOW (ref 3.87–5.11)
RDW: 13.6 % (ref 11.5–15.5)
WBC: 7.4 10*3/uL (ref 4.0–10.5)
nRBC: 0 % (ref 0.0–0.2)

## 2021-03-03 LAB — GLUCOSE, CAPILLARY
Glucose-Capillary: 172 mg/dL — ABNORMAL HIGH (ref 70–99)
Glucose-Capillary: 208 mg/dL — ABNORMAL HIGH (ref 70–99)

## 2021-03-03 MED ORDER — ACETAMINOPHEN 325 MG PO TABS: 650.0000 mg | ORAL_TABLET | Freq: Four times a day (QID) | ORAL | Status: DC | PRN

## 2021-03-03 MED ORDER — DEXAMETHASONE 4 MG PO TABS: 4.0000 mg | ORAL_TABLET | Freq: Two times a day (BID) | ORAL | 0 refills | Status: DC

## 2021-03-03 MED ORDER — DEXAMETHASONE 4 MG PO TABS
4.0000 mg | ORAL_TABLET | Freq: Two times a day (BID) | ORAL | Status: DC
  Filled 2021-03-03: qty 1

## 2021-03-03 MED ORDER — FUROSEMIDE 40 MG PO TABS: 40.0000 mg | ORAL_TABLET | Freq: Every day | ORAL | 1 refills | Status: DC

## 2021-03-03 NOTE — Discharge Instructions (Signed)
Metastatic Brain Tumor, Adult  A metastatic brain tumor is a collection of cancer cells that develop somewhere else in the body and then spread (metastasize) to the brain and grow there. In most cases, the cancer will also have spread to other areas of the body. In most cases, the first (primary) cancer was found and treated, but treatment did not prevent the cancer cells from spreading. Signs and symptoms of a metastatic brain tumor may occur months or even years after treatment for the primary cancer. In very rare cases, these symptoms may be the first sign of the primary cancer. What are the causes? This condition is caused by cancer. Among men, the most common cause of a metastatic brain tumor is lung cancer. Among women, the most common cause is breast cancer. Other kinds of cancer can also cause this condition, including:  Cancer of the skin (melanoma).  Cancer of the large intestine.  Cancer of the kidney.  Cancer of the prostate. What increases the risk? You are more likely to develop this condition if you have or have had cancer. What are the signs or symptoms? Symptoms of this condition depend on the location of the tumor within the brain. They may include:  Headache. This is often the first symptom. It may be worse in the morning and improve after vomiting.  Nausea and vomiting.  Vision, hearing, or speech changes and problems.  Seizures.  Inability to walk, loss of balance, or weakness and numbness on one side of the body or in an arm or leg.  Changes in mood, personality, memory, or thinking.  Changes in weight or energy. How is this diagnosed? This condition is diagnosed based on a medical history and a physical exam, including a neurological exam. Brain imaging tests will also be done, such as a CT scan or MRI. A sample of the tumor will be taken and studied in a lab (biopsy) to confirm the diagnosis. This will also determine the grade of the tumor, which may be low or  high. High-grade tumors grow faster. If the biopsy shows that this is a metastatic brain tumor, additional testing will be done to determine where the cancer came from. This could include tests such as full-body CT scans and PET scans. Blood tests called tumor markers may also be done. You may need to have additional procedures, such as colonoscopy, to help determine where the cancer came from. You may need to work with a health care provider who specializes in cancer treatment (oncologist). How is this treated? Treatment depends on the type of cancer you have and your overall health. It also depends on the size and location of your tumor or tumors. A combination of therapies is often required. The most common treatments include:  Surgery to remove the brain tumor or tumors. Once cancer has spread to the brain from other areas of the body, surgery is not used to treat the primary location of the cancer.  High-energy rays (radiation therapy) to help shrink or kill the brain tumor. There are different types of radiation therapy, including intensity-modulated radiation therapy, proton beam therapy, and stereotactic radiation therapy (radiosurgery).  Chemotherapy. This is the use of medicines to shrink or kill the tumor. The medicines can be given by injection or as a pill.  Targeted therapy. This uses substances that injure or kill cancer cells without affecting normal cells.  Immunotherapy. This is the use of medicines to boost the body's defense system (immune system) to kill the tumor  cells. You may be given medicines to help control pain, nausea, and seizures. Your health care provider may also refer you to physical, occupational, or speech therapy, or a combination of these therapies, as part of your treatment. Follow these instructions at home:  Take over-the-counter and prescription medicines only as told by your health care provider.  Consider joining a support group.  Work with your  health care provider to manage any side effects that you experience during treatment.  Do exercises and activities as told by your health care provider or therapist.  Keep all follow-up visits. This is important. Your health care provider will closely monitor you for any signs that the cancer is returning. You may need more frequent visits with your health care provider as well as periodic imaging studies to determine whether the brain tumor and the primary cancer are responding to treatment. Contact a health care provider if:  You have worsening symptoms or symptoms that have returned.  You cannot eat or drink what you need. Get help right away if:  You have a seizure, particularly if this is a new symptom.  You have a fever.  You have new symptoms, such as vision problems or trouble walking. Summary  A metastatic brain tumor is a collection of cancer cells that develop somewhere else in the body and then spread to the brain and grow there. In most cases, the cancer will have also spread to other areas of the body.  Treatment depends on the type of cancer you have and your overall health. It also depends on the size and location of your tumor or tumors.  Contact a health care provider if your symptoms return or get worse. Get help right away if you have a seizure, a fever, or new symptoms such as vision problems or trouble walking. This information is not intended to replace advice given to you by your health care provider. Make sure you discuss any questions you have with your health care provider. Document Revised: 06/27/2020 Document Reviewed: 06/27/2020 Elsevier Patient Education  2021 [employer].

## 2021-03-03 NOTE — Discharge Summary (Signed)
[name]  MR#: [id number]  DOB:05-04-1951  Date of Admission: 02/28/2021 Date of Discharge: 03/03/2021  Attending Physician:Willer Osorno Hennie Duos, MD  Patient's RFF:[facility], [name] Areola, MD  Consults: Rad Onc Neurosurgery   Disposition: D/C home   Follow-up Appts:  Follow-up Information    [name], MD Follow up in 1 week(s).   Specialty: Internal Medicine Contact information: [place] [address] [phone]        Consuella Lose, MD Follow up.   Specialty: Neurosurgery Why: As needed  Contact information: 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  This was confirmed by follow-up MRI. This lesion was known from initial staging imaging, but had responded to systemic chemotherapy in prior f/u studies.   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 Julien [name] and Dr. Sondra [name] - s/p chemo  and XRT to chest - Rad Onc initiated whole brain XRT w/ tx 5/19 and 5/20 - to resume tx on 5/23 via outpt   Acute hypoxic resp failure - Chronic diastolic CHF - possible pulmonary edema  sats in mid 80s on presentation - has quickly been weaned to RA after simple diuresis - appears to have been related to mild pulmonary edema - monitor trend - cont daily diuretic tx    CKD Stage 3A Creatinine slightly increased with the use of diuretic -follow trend as outpt   Recent Labs  Lab 02/28/21 1056 03/01/21 0506 03/02/21 0259 03/03/21 0111  CREATININE 1.00 1.14* 1.21* 1.14*    COPD Well compensated with no wheezing during this admission   DM2 CBG controlled despite steroid therapy - A1c 6.5 - monitor with SSI -controlled with oral medications at home - resume usual home meds at d/c   CAD Asymptomatic  Cirrhosis  Appears well compensated presently  Normocytic anemia Likely simply related to chronic disease/cancer - Fe low normal, but TIBC elevated - ferritin 68 - monitor for now w/o specific tx   Allergies as of 03/03/2021      Reactions   Iohexol Shortness Of Breath, Other (See Comments)   CHEST TIGHTNESS and BREATHING PROBLEMS- NEEDS PRE-MEDS 13 hours beforehand-  premeds given 06/08/2020 without incident    Tape Other (See Comments)   Tears the skin!!   Betadine [povidone Iodine] Rash   Ciprofloxacin Rash   Latex Rash, Other (See Comments)   NO latex gloves!!   Penicillins Swelling   Did it involve swelling of  the face/tongue/throat, SOB, or low BP? Yes Did it involve sudden or severe rash/hives, skin peeling, or any reaction on the inside of your mouth or nose? No Did you need to seek medical attention at a hospital or doctor's office? Yes When did it last happen?1996 If all above answers are "NO", may proceed with cephalosporin use.   Povidone-iodine Rash      Medication List    STOP taking these medications   predniSONE 50 MG tablet Commonly known as:  DELTASONE     TAKE these medications   acetaminophen 325 MG tablet Commonly known as: TYLENOL Take 2 tablets (650 mg total) by mouth every 6 (six) hours as needed for mild pain (or Fever >/= 101).   albuterol 108 (90 Base) MCG/ACT inhaler Commonly known as: VENTOLIN HFA Inhale 2 puffs into the lungs every 4 (four) hours as needed for wheezing or shortness of breath.   Alpha-Lipoic Acid 200 MG Caps Take 200 mg by mouth daily.   aspirin-acetaminophen-caffeine 250-250-65 MG tablet Commonly known as: EXCEDRIN MIGRAINE Take 2 tablets by mouth daily as needed for headache. Up to 4 times a week   b complex vitamins tablet Take 1 tablet by mouth daily. With Zinc and vitamin C   Biotin 5000 MCG Tabs Take 10,000 mcg by mouth daily. Keratin   BOIL-EASE EX Apply 1 application topically daily as needed (Boil).   carbamide peroxide 6.5 % OTIC solution Commonly known as: DEBROX Place 5 drops into both ears daily as needed (ears).   colestipol 1 g tablet Commonly known as: COLESTID Take 3 tablets (3 g total) by mouth daily. Do not take within two hours of other medications.   dexamethasone 4 MG tablet Commonly known as: DECADRON Take 1 tablet (4 mg total) by mouth every 12 (twelve) hours.   diazepam 5 MG tablet Commonly known as: VALIUM Take 2.5 mg by mouth at bedtime. 1/2 tab qhs   diclofenac Sodium 1 % Gel Commonly known as: VOLTAREN Apply 2 g topically daily as needed (pain).   folic acid 1 MG tablet Commonly known as: FOLVITE Take 1 mg by mouth daily.   furosemide 40 MG tablet Commonly known as: LASIX Take 1 tablet (40 mg total) by mouth daily. What changed: when to take this   Hydrocortisone (Perianal) 1 % Crea Commonly known as: Procto-Pak Apply anorectally twice daily for two weeks.   hydrocortisone cream 1 % Apply 1 application topically daily as needed for itching.   levothyroxine 112 MCG tablet Commonly known as: SYNTHROID Take 112 mcg by mouth daily.    metFORMIN 500 MG tablet Commonly known as: GLUCOPHAGE Take 1,000 mg by mouth 2 (two) times daily with a meal. Pt has been taking 2tabs BID.   multivitamin with minerals tablet Take 1 tablet by mouth daily. Adult 50+   neomycin-bacitracin-polymyxin Oint Commonly known as: NEOSPORIN Apply 1 application topically daily as needed for irritation or wound care.   omega-3 acid ethyl esters 1 g capsule Commonly known as: LOVAZA Take 1 g by mouth 2 (two) times daily.   OVER THE COUNTER MEDICATION Take 450 mg by mouth daily.   oxyCODONE-acetaminophen 5-325 MG tablet Commonly known as: PERCOCET/ROXICET Take 0.5 tablets by mouth 2 (two) times daily as needed for severe pain.   pantoprazole 40 MG tablet Commonly known as: PROTONIX Take 40 mg by mouth daily.   PARoxetine 20 MG tablet Commonly known as: PAXIL Take 20 mg by mouth daily.   PROBIOTIC PO Take 1 capsule  by mouth daily.   rosuvastatin 20 MG tablet Commonly known as: CRESTOR Take 20 mg by mouth daily as needed.   traZODone 150 MG tablet Commonly known as: DESYREL Take 75 mg by mouth at bedtime.   Trelegy Ellipta 100-62.5-25 MCG/INH Aepb Generic drug: Fluticasone-Umeclidin-Vilant Inhale 1 puff into the lungs daily.   Vitamin B12 1000 MCG Tbcr 1 tablet   Vitamin D3 50 MCG (2000 UT) Tabs Take 8,000 Units by mouth daily.       Day of Discharge BP (!) 141/72 (BP Location: Left Arm)   Pulse (!) 51   Temp 98.2 F (36.8 C) (Oral)   Resp 16   SpO2 94%   Physical Exam: General: No acute respiratory distress Lungs: Clear to auscultation bilaterally without wheezes or crackles Cardiovascular: Regular rate and rhythm without murmur gallop or rub normal S1 and S2 Abdomen: Nontender, nondistended, soft, bowel sounds positive, no rebound, no ascites, no appreciable mass Extremities: No significant cyanosis, clubbing, or edema bilateral lower extremities  Basic Metabolic Panel: Recent Labs  Lab 02/28/21 1056  03/01/21 0506 03/02/21 0259 03/03/21 0111  NA 136 136 136 132*  K 4.4 4.2 4.5 5.4*  CL 99 100 100 98  CO2 25 27 27 26   GLUCOSE 138* 112* 155* 152*  BUN 20 24* 33* 37*  CREATININE 1.00 1.14* 1.21* 1.14*  CALCIUM 9.9 9.7 9.2 9.0  MG  --   --  1.8  --     Liver Function Tests: Recent Labs  Lab 02/28/21 1056 03/02/21 0259  AST 20 28  ALT 12 18  ALKPHOS 44 37*  BILITOT 0.6 0.4  PROT 7.4 7.0  ALBUMIN 3.7 3.5   Recent Labs  Lab 02/28/21 1056  LIPASE 22   CBC: Recent Labs  Lab 02/28/21 1056 03/01/21 0506 03/02/21 0259 03/03/21 0111  WBC 6.8 5.2 8.0 7.4  HGB 11.7* 11.5* 11.1* 11.4*  HCT 37.2 35.6* 34.2* 34.6*  MCV 98.9 97.3 96.1 96.6  PLT 213 186 201 214   BNP (last 3 results)   CBG: Recent Labs  Lab 03/02/21 0801 03/02/21 1721 03/02/21 2100 03/03/21 0750 03/03/21 1201  GLUCAP 165* 171* 248* 172* 208*    Recent Results (from the past 240 hour(s))  Resp Panel by RT-PCR (Flu A&B, Covid) Nasopharyngeal Swab     Status: None   Collection Time: 02/28/21  5:03 PM   Specimen: Nasopharyngeal Swab; Nasopharyngeal(NP) swabs in vial transport medium  Result Value Ref Range Status   SARS Coronavirus 2 by RT PCR NEGATIVE NEGATIVE Final    Comment: (NOTE) SARS-CoV-2 target nucleic acids are NOT DETECTED.  The SARS-CoV-2 RNA is generally detectable in upper respiratory specimens during the acute phase of infection. The lowest concentration of SARS-CoV-2 viral copies this assay can detect is 138 copies/mL. A negative result does not preclude SARS-Cov-2 infection and should not be used as the sole basis for treatment or other patient management decisions. A negative result may occur with  improper specimen collection/handling, submission of specimen other than nasopharyngeal swab, presence of viral mutation(s) within the areas targeted by this assay, and inadequate number of viral copies(<138 copies/mL). A negative result must be combined with clinical  observations, patient history, and epidemiological information. The expected result is Negative.  Fact Sheet for Patients:  EntrepreneurPulse.com.au  Fact Sheet for Healthcare Providers:  IncredibleEmployment.be  This test is no t yet approved or cleared by the [place] FDA and  has been authorized for detection and/or diagnosis of SARS-CoV-2 by FDA  under an Emergency Use Authorization (EUA). This EUA will remain  in effect (meaning this test can be used) for the duration of the COVID-19 declaration under Section 564(b)(1) of the Act, 21 U.S.C.section 360bbb-3(b)(1), unless the authorization is terminated  or revoked sooner.       Influenza A by PCR NEGATIVE NEGATIVE Final   Influenza B by PCR NEGATIVE NEGATIVE Final    Comment: (NOTE) The Xpert Xpress SARS-CoV-2/FLU/RSV plus assay is intended as an aid in the diagnosis of influenza from Nasopharyngeal swab specimens and should not be used as a sole basis for treatment. Nasal washings and aspirates are unacceptable for Xpert Xpress SARS-CoV-2/FLU/RSV testing.  Fact Sheet for Patients: EntrepreneurPulse.com.au  Fact Sheet for Healthcare Providers: IncredibleEmployment.be  This test is not yet approved or cleared by the [place] FDA and has been authorized for detection and/or diagnosis of SARS-CoV-2 by FDA under an Emergency Use Authorization (EUA). This EUA will remain in effect (meaning this test can be used) for the duration of the COVID-19 declaration under Section 564(b)(1) of the Act, 21 U.S.C. section 360bbb-3(b)(1), unless the authorization is terminated or revoked.  Performed at [facility], [place] [address], [place], [place] [address]       Time spent in discharge (includes decision making & examination of pt): 35 minutes  03/03/2021, 12:05 PM   Cherene Altes, MD [facility] Office  [phone]

## 2021-03-06 ENCOUNTER — Ambulatory Visit
Admission: RE | Admit: 2021-03-06 | Discharge: 2021-03-06 | Disposition: A | Discharge status: Home / Self Care | Payer: Medicare HMO | Source: Ambulatory Visit | Attending: Radiation Oncology | Admitting: Radiation Oncology

## 2021-03-06 ENCOUNTER — Other Ambulatory Visit: Payer: Self-pay

## 2021-03-06 DIAGNOSIS — C7931 Secondary malignant neoplasm of brain: Secondary | ICD-10-CM | POA: Diagnosis not present

## 2021-03-06 DIAGNOSIS — Z510 Encounter for antineoplastic radiation therapy: Secondary | ICD-10-CM | POA: Diagnosis not present

## 2021-03-06 DIAGNOSIS — C342 Malignant neoplasm of middle lobe, bronchus or lung: Secondary | ICD-10-CM | POA: Diagnosis not present

## 2021-03-06 DIAGNOSIS — Z87891 Personal history of nicotine dependence: Secondary | ICD-10-CM | POA: Diagnosis not present

## 2021-03-07 ENCOUNTER — Encounter (HOSPITAL_COMMUNITY): Payer: Self-pay | Admitting: Occupational Therapy

## 2021-03-07 ENCOUNTER — Ambulatory Visit
Admission: RE | Admit: 2021-03-07 | Discharge: 2021-03-07 | Disposition: A | Discharge status: Home / Self Care | Payer: Medicare HMO | Source: Ambulatory Visit | Attending: Radiation Oncology | Admitting: Radiation Oncology

## 2021-03-07 ENCOUNTER — Ambulatory Visit (HOSPITAL_COMMUNITY): Payer: Medicare HMO | Attending: Orthopaedic Surgery | Admitting: Occupational Therapy

## 2021-03-07 DIAGNOSIS — C342 Malignant neoplasm of middle lobe, bronchus or lung: Secondary | ICD-10-CM | POA: Diagnosis not present

## 2021-03-07 DIAGNOSIS — R29898 Other symptoms and signs involving the musculoskeletal system: Secondary | ICD-10-CM

## 2021-03-07 DIAGNOSIS — Z87891 Personal history of nicotine dependence: Secondary | ICD-10-CM | POA: Diagnosis not present

## 2021-03-07 DIAGNOSIS — Z510 Encounter for antineoplastic radiation therapy: Secondary | ICD-10-CM | POA: Diagnosis not present

## 2021-03-07 DIAGNOSIS — M79642 Pain in left hand: Secondary | ICD-10-CM

## 2021-03-07 DIAGNOSIS — C7931 Secondary malignant neoplasm of brain: Secondary | ICD-10-CM | POA: Diagnosis not present

## 2021-03-07 NOTE — Patient Instructions (Signed)

## 2021-03-07 NOTE — Therapy (Signed)
[place], [place], [address] Phone: [phone]   Fax:  [phone]  Occupational Therapy Evaluation  Patient Details  Name: [name] MRN: [id number] Date of Birth: 1950/12/20 Referring Provider (OT): Dr. Harlin Heys   Encounter Date: 03/07/2021   OT End of Session - 03/07/21 1332    Visit Number 1    Number of Visits 1    Date for OT Re-Evaluation 03/08/21    Authorization Type 1) [employer] Medicare, copay $20  2) Medicaid    Progress Note Due on Visit 10    OT Start Time 1301    OT Stop Time 1329    OT Time Calculation (min) 28 min    Activity Tolerance Patient tolerated treatment well    Behavior During Therapy [facility] for tasks assessed/performed           Past Medical History:  Diagnosis Date  . Acute on chronic diastolic CHF (congestive heart failure) ([place]) 02/28/2021  . Allergic rhinitis   . 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 Anemia   . Anxiety   . ASCVD (arteriosclerotic cardiovascular disease)    MI in 96 requiring BMS CX; DES to M1 in 2000;normal coronary angiography in 2004  . Cholelithiasis   . COPD (chronic obstructive pulmonary disease) ([place])   . DDD (degenerative disc disease), lumbar   . Depression   . Diabetes mellitus    Type II  . Diarrhea   . DVT (deep venous thrombosis) ([place])    patient said no  . GERD (gastroesophageal reflux disease)   . Headache    migraine  . History of kidney stones    2006  . History of radiation therapy 06/28/2020-08/08/2020   right Lung; Dr. [name]  . Hyperlipidemia   . Hypertension   . Hypotension   . Hypothyroidism   . Low back pain   . Myocardial infarction ([place]) 01/1995  . Nephrolithiasis 2006   stone extraction   . Panic attacks   . Peripheral neuropathy   . Peripheral vascular disease ([facility])    legs  . Pneumonia 12/22/2019  . Sciatic pain    right  . Small cell lung cancer ([place])   . Tobacco abuse   .  Tremor     Past Surgical History:  Procedure Laterality Date  . AGILE CAPSULE N/A 03/07/2020   Procedure: AGILE CAPSULE;  Surgeon: Daneil Dolin, MD;  [facility];  Service: Endoscopy;  Laterality: N/A;  7:30am  . BACK SURGERY     fusion  . BRONCHIAL NEEDLE ASPIRATION BIOPSY N/A 05/20/2020   Procedure: BRONCHIAL NEEDLE ASPIRATION BIOPSIES;  Surgeon: Collene Gobble, MD;  Location: [facility] ENDOSCOPY;  Service: Pulmonary;  Laterality: N/A;  . CHOLECYSTECTOMY    . COLONOSCOPY  01/2009   [facility]:XNATFT rectum/repeat in 5 yrs  . COLONOSCOPY N/A 04/29/2014   Dr.Rourk- attempted/incomplete colonoscopy. inadequate prep  . COLONOSCOPY N/A 05/27/2014   [facility]:KGURKYHCW coli. Colonic polyps-removed as described above.Status post segmental biopsy. single tubular adenoma and random colon bx neg  . COLONOSCOPY WITH PROPOFOL N/A 12/23/2019   Procedure: COLONOSCOPY WITH PROPOFOL;  Surgeon: Ronnette Juniper, MD;  Location: [place];  Service: Gastroenterology;  Laterality: N/A;  . DILATION AND CURETTAGE OF UTERUS  1974  . ESOPHAGOGASTRODUODENOSCOPY  05/2010   Dr. Tessie Fass, erosion. 6F dilation  . ESOPHAGOGASTRODUODENOSCOPY N/A 04/29/2014   Dr.Rourk- normal esophagus s/p passage of maloney dilator. small hiatal hernia- bx= chronic inflammation.  Marland Kitchen  ESOPHAGOGASTRODUODENOSCOPY (EGD) WITH PROPOFOL N/A 12/23/2019   Procedure: ESOPHAGOGASTRODUODENOSCOPY (EGD) WITH PROPOFOL;  Surgeon: Ronnette Juniper, MD;  Location: [place];  Service: Gastroenterology;  Laterality: N/A;  . GIVENS CAPSULE STUDY N/A 03/22/2020   Procedure: GIVENS CAPSULE STUDY;  Surgeon: Daneil Dolin, MD;  [facility];  Service: Endoscopy;  Laterality: N/A;  7:30am  . HEMOSTASIS CLIP PLACEMENT  12/23/2019   Procedure: HEMOSTASIS CLIP PLACEMENT;  Surgeon: Ronnette Juniper, MD;  Location: [place];  Service: Gastroenterology;;  . HOT HEMOSTASIS N/A 12/23/2019   Procedure: HOT HEMOSTASIS (ARGON PLASMA COAGULATION/BICAP);  Surgeon: Ronnette Juniper,  MD;  Location: [place];  Service: Gastroenterology;  Laterality: N/A;  . LEFT HEART CATH AND CORONARY ANGIOGRAPHY N/A 12/24/2019   Procedure: LEFT HEART CATH AND CORONARY ANGIOGRAPHY;  Surgeon: Nigel Mormon, MD;  Location: [place] CV LAB;  Service: Cardiovascular;  Laterality: N/A;  . Venia Minks DILATION N/A 04/29/2014   Procedure: Venia Minks DILATION;  Surgeon: Daneil Dolin, MD;  [facility];  Service: Endoscopy;  Laterality: N/A;  . PARTIAL HYSTERECTOMY  1978  . POLYPECTOMY  12/23/2019   Procedure: POLYPECTOMY;  Surgeon: Ronnette Juniper, MD;  Location: [facility] END[facility];  Service: Gastroenterology;;  . SHOULDER SURGERY Left    Left shoulder for RTC;left arm surgery '98/left hand surgery 2001  . TOTAL ABDOMINAL HYSTERECTOMY W/ BILATERAL SALPINGOOPHORECTOMY  2002  . UMBILICAL HERNIA REPAIR  2008  . VIDEO BRONCHOSCOPY WITH ENDOBRONCHIAL ULTRASOUND N/A 05/20/2020   Procedure: VIDEO BRONCHOSCOPY WITH ENDOBRONCHIAL ULTRASOUND;  Surgeon: Collene Gobble, MD;  Location: [facility] ENDOSCOPY;  Service: Pulmonary;  Laterality: N/A;    There were no vitals filed for this visit.   Subjective Assessment - 03/07/21 1330    Subjective  S: That shot seemed to have done the trick.    Pertinent History Pt is a 70 y/o female presenting with left hand pain s/p dog bite that occurred approximately 2 months ago. Pt received a cortisone shot on 02/15/21 and reports pain seems to have resolved. Pt was referred to occupational therapy for evaluation and treatment by Dr. Harlin Heys.    Special Tests DASH: 4.55    Patient Stated Goals To have no pain.    Currently in Pain? No/denies             [facility] OT Assessment - 03/07/21 1305      Assessment   Medical Diagnosis left hand pain s/p dog bite    Referring Provider (OT) Dr. Harlin Heys    Onset Date/Surgical Date 12/13/20   approximate   Hand Dominance Left    Next MD Visit none scheduled-as needed    Prior Therapy None      Precautions    Precautions None      Restrictions   Weight Bearing Restrictions No      Balance Screen   Has the patient fallen in the past 6 months No      Prior Function   Level of Independence Independent    Vocation Retired    Leisure mowing, cleaning car      ADL   ADL comments Prior to the cortisone shot pt was difficulty with using hand during all ADLs-grasping and holding items, manipulating items, opening/closing      Written Expression   Dominant Hand Left      Cognition   Overall Cognitive Status Within Functional Limits for tasks assessed      Observation/Other Assessments   Quick DASH  4.55  ROM / Strength   AROM / PROM / Strength AROM;Strength      Strength   Strength Assessment Site Hand    Right/Left hand Left    Left Hand Gross Grasp Functional    Left Hand Grip (lbs) 55    Left Hand Lateral Pinch 15 lbs    Left Hand 3 Point Pinch 11 lbs      Right Hand AROM   R Thumb MCP 0-60 60 Degrees    R Thumb IP 0-80 70 Degrees    R Thumb Radial ABduction/ADduction 0-55 55    R Thumb Palmar ABduction/ADduction 0-45 45    R Index  MCP 0-90 20 Degrees   baseline   R Index PIP 0-100 80 Degrees   baseline   R Index DIP 0-70 0 Degrees   baseline              Quick Dash - 03/07/21 1318    Open a tight or new jar No difficulty    Do heavy household chores (wash walls, wash floors) No difficulty    Carry a shopping bag or briefcase No difficulty    Wash your back No difficulty    Use a knife to cut food No difficulty    Recreational activities in which you take some force or impact through your arm, shoulder, or hand (golf, hammering, tennis) Moderate difficulty    During the past week, to what extent has your arm, shoulder or hand problem interfered with your normal social activities with family, friends, neighbors, or groups? Not at all    During the past week, to what extent has your arm, shoulder or hand problem limited your work or other regular daily activities  Not at all    Arm, shoulder, or hand pain. None    Tingling (pins and needles) in your arm, shoulder, or hand None    Difficulty Sleeping No difficulty    DASH Score 4.55 %                      OT Education - 03/07/21 1323    Education Details red theraputty    Person(s) Educated Patient    Methods Explanation;Demonstration;Handout    Comprehension Verbalized understanding;Returned demonstration            OT Short Term Goals - 03/07/21 1336      OT SHORT TERM GOAL #1   Title Pt will be provided with and educated on HEP to improve strength in left hand for ADL and lawncare work.    Time 1    Period Days    Status Achieved    Target Date 03/07/21                    Plan - 03/07/21 1333    Clinical Impression Statement A: Pt is a 70 y/o female s/p dog bite with subsequent left hand pain occuring approximately 2 months ago. Pt received cortisone shot which seems to have eliminated her pain. Pt demonstrating ROM and strength WNL, with exception of 2nd digit which has limitations due to past surgery. Pt is functioning at her baseline, was provided with red theraputty HEP for continued strengthening. Pt questioning splint for outdoor work, discussed potential use of thumb spica splint. Also discussed joint protection strategies.    OT Occupational Profile and History Problem Focused Assessment - Including review of records relating to presenting problem    Occupational performance deficits (Please refer to evaluation  for details): ADL's;IADL's    Rehab Potential Good    Clinical Decision Making Limited treatment options, no task modification necessary    Comorbidities Affecting Occupational Performance: None    Modification or Assistance to Complete Evaluation  No modification of tasks or assist necessary to complete eval    OT Frequency One time visit    OT Treatment/Interventions Patient/family education    Plan P: No further OT services required at this time,  pt educated on HEP and potential use of thumb spica splint as needed.    OT Home Exercise Plan red theraputty grip and pinch strengthening    Consulted and Agree with Plan of Care Patient           Patient will benefit from skilled therapeutic intervention in order to improve the following deficits and impairments:           Visit Diagnosis: Pain in left hand  Other symptoms and signs involving the musculoskeletal system    Problem List Patient Active Problem List   Diagnosis Date Noted  . Brain mass 02/28/2021  . Acute on chronic diastolic CHF (congestive heart failure) ([place]) 02/28/2021  . Brain metastases ([place])   . Small cell lung cancer, right middle lobe ([place]) 05/30/2020  . Encounter for antineoplastic chemotherapy 05/30/2020  . Goals of care, counseling/discussion 05/30/2020  . Intolerance to cold 05/23/2020  . Mediastinal adenopathy 05/20/2020  . Abnormal weight loss 05/17/2020  . IDA (iron deficiency anemia) 02/15/2020  . Hepatic cirrhosis ([place]) 02/15/2020  . CKD (chronic kidney disease), stage IIIa 01/09/2020  . Lung mass 01/09/2020  . Orthostatic hypotension 12/31/2019  . Non-ST elevation (NSTEMI) myocardial infarction ([place])   . Anemia   . Dyspnea   . Hx of heart artery stent   . Benign hypertension with CKD (chronic kidney disease) stage III ([place])   . Former smoker   . Chest pain 12/20/2019  . Weakness 09/04/2014  . Dizziness 09/04/2014  . Hepatomegaly 03/31/2014  . Esophageal dysphagia 03/31/2014  . Unspecified constipation 03/31/2014  . AKI (acute kidney injury) ([place]) 10/30/2013  . Altered mental status 09/28/2013  . ETOH abuse 09/28/2013  . Altered mental state 09/28/2013  . Hyperkalemia 06/05/2013  . Bradycardia 06/05/2013  . Edema 06/02/2013  . Hyponatremia 06/02/2013  . Pneumonia due to infectious agent 12/26/2012  . Insomnia due to mental disorder 09/02/2012  . RECTAL PAIN 09/04/2010  . CHEST PAIN 08/22/2010  . Insulin dependent type 2  diabetes mellitus ([place]) 06/16/2010  . Coronary artery disease/Prior Stents 1997 and 2000/RCA occlusion 12/2019 06/16/2010  . MICROALBUMINURIA 06/01/2009  . 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 OTHER DYSPHAGIA 01/05/2009  . COLONIC POLYPS, ADENOMATOUS, HX OF 01/05/2009  . SCHATZKI'S RING, HX OF 01/05/2009  . BACK PAIN 08/04/2008  . DIARRHEA, CHRONIC 08/04/2008  . TOBACCO ABUSE 06/03/2007  . IBS 06/03/2007  . NEPHROLITHIASIS 02/04/2007  . ANXIETY STATE NOS 11/26/2006  . Hypothyroidism 11/01/2006  . Mixed hyperlipidemia 11/01/2006  . Depression 11/01/2006  . PERIPHERAL NEUROPATHY 11/01/2006  . CATARACT NOS 11/01/2006  . ALLERGIC RHINITIS 11/01/2006  . COPD (chronic obstructive pulmonary disease) ([place]) 11/01/2006  . GERD 11/01/2006  . DEGENERATION, DISC NOS 11/01/2006  . LOW BACK PAIN 11/01/2006  . DVT, HX OF 11/01/2006   Guadelupe Sabin, OTR/L  [phone] 03/07/2021, 1:37 PM  [place] [address] [place], [place], [address] Phone: [phone]   Fax:  [phone]  Name: [name] MRN: [id number] Date of Birth: [birth year]

## 2021-03-08 ENCOUNTER — Ambulatory Visit
Admission: RE | Admit: 2021-03-08 | Discharge: 2021-03-08 | Disposition: A | Discharge status: Home / Self Care | Payer: Medicare HMO | Source: Ambulatory Visit | Attending: Radiation Oncology | Admitting: Radiation Oncology

## 2021-03-08 ENCOUNTER — Other Ambulatory Visit: Payer: Self-pay | Admitting: Radiation Oncology

## 2021-03-08 ENCOUNTER — Other Ambulatory Visit: Payer: Self-pay

## 2021-03-08 DIAGNOSIS — R42 Dizziness and giddiness: Secondary | ICD-10-CM | POA: Diagnosis not present

## 2021-03-08 DIAGNOSIS — I10 Essential (primary) hypertension: Secondary | ICD-10-CM | POA: Diagnosis not present

## 2021-03-08 DIAGNOSIS — C342 Malignant neoplasm of middle lobe, bronchus or lung: Secondary | ICD-10-CM | POA: Diagnosis not present

## 2021-03-08 DIAGNOSIS — C7931 Secondary malignant neoplasm of brain: Secondary | ICD-10-CM | POA: Diagnosis not present

## 2021-03-08 DIAGNOSIS — D696 Thrombocytopenia, unspecified: Secondary | ICD-10-CM | POA: Diagnosis not present

## 2021-03-08 DIAGNOSIS — Z87891 Personal history of nicotine dependence: Secondary | ICD-10-CM | POA: Diagnosis not present

## 2021-03-08 DIAGNOSIS — M533 Sacrococcygeal disorders, not elsewhere classified: Secondary | ICD-10-CM | POA: Diagnosis not present

## 2021-03-08 DIAGNOSIS — D496 Neoplasm of unspecified behavior of brain: Secondary | ICD-10-CM | POA: Diagnosis not present

## 2021-03-08 DIAGNOSIS — I509 Heart failure, unspecified: Secondary | ICD-10-CM | POA: Diagnosis not present

## 2021-03-08 DIAGNOSIS — Z510 Encounter for antineoplastic radiation therapy: Secondary | ICD-10-CM | POA: Diagnosis not present

## 2021-03-08 MED ORDER — DEXAMETHASONE 4 MG PO TABS: 4.0000 mg | ORAL_TABLET | Freq: Two times a day (BID) | ORAL | 0 refills | Status: DC

## 2021-03-09 ENCOUNTER — Ambulatory Visit
Admission: RE | Admit: 2021-03-09 | Discharge: 2021-03-09 | Disposition: A | Discharge status: Home / Self Care | Payer: Medicare HMO | Source: Ambulatory Visit | Attending: Radiation Oncology | Admitting: Radiation Oncology

## 2021-03-09 DIAGNOSIS — C342 Malignant neoplasm of middle lobe, bronchus or lung: Secondary | ICD-10-CM | POA: Diagnosis not present

## 2021-03-09 DIAGNOSIS — Z510 Encounter for antineoplastic radiation therapy: Secondary | ICD-10-CM | POA: Diagnosis not present

## 2021-03-09 DIAGNOSIS — Z87891 Personal history of nicotine dependence: Secondary | ICD-10-CM | POA: Diagnosis not present

## 2021-03-09 DIAGNOSIS — C7931 Secondary malignant neoplasm of brain: Secondary | ICD-10-CM | POA: Diagnosis not present

## 2021-03-10 ENCOUNTER — Other Ambulatory Visit: Payer: Self-pay

## 2021-03-10 ENCOUNTER — Ambulatory Visit
Admission: RE | Admit: 2021-03-10 | Discharge: 2021-03-10 | Disposition: A | Discharge status: Home / Self Care | Payer: Medicare HMO | Source: Ambulatory Visit | Attending: Radiation Oncology | Admitting: Radiation Oncology

## 2021-03-10 DIAGNOSIS — C342 Malignant neoplasm of middle lobe, bronchus or lung: Secondary | ICD-10-CM | POA: Diagnosis not present

## 2021-03-10 DIAGNOSIS — Z87891 Personal history of nicotine dependence: Secondary | ICD-10-CM | POA: Diagnosis not present

## 2021-03-10 DIAGNOSIS — Z510 Encounter for antineoplastic radiation therapy: Secondary | ICD-10-CM | POA: Diagnosis not present

## 2021-03-10 DIAGNOSIS — C7931 Secondary malignant neoplasm of brain: Secondary | ICD-10-CM | POA: Diagnosis not present

## 2021-03-14 ENCOUNTER — Ambulatory Visit
Admission: RE | Admit: 2021-03-14 | Discharge: 2021-03-14 | Disposition: A | Discharge status: Home / Self Care | Payer: Medicare HMO | Source: Ambulatory Visit | Attending: Radiation Oncology | Admitting: Radiation Oncology

## 2021-03-14 ENCOUNTER — Other Ambulatory Visit: Payer: Self-pay

## 2021-03-14 DIAGNOSIS — C7931 Secondary malignant neoplasm of brain: Secondary | ICD-10-CM | POA: Diagnosis not present

## 2021-03-14 DIAGNOSIS — C342 Malignant neoplasm of middle lobe, bronchus or lung: Secondary | ICD-10-CM | POA: Diagnosis not present

## 2021-03-14 DIAGNOSIS — Z87891 Personal history of nicotine dependence: Secondary | ICD-10-CM | POA: Diagnosis not present

## 2021-03-14 DIAGNOSIS — Z510 Encounter for antineoplastic radiation therapy: Secondary | ICD-10-CM | POA: Diagnosis not present

## 2021-03-15 ENCOUNTER — Other Ambulatory Visit: Payer: Self-pay | Admitting: Medical Oncology

## 2021-03-15 ENCOUNTER — Ambulatory Visit
Admission: RE | Admit: 2021-03-15 | Discharge: 2021-03-15 | Disposition: A | Discharge status: Home / Self Care | Payer: Medicare HMO | Source: Ambulatory Visit | Attending: Radiation Oncology | Admitting: Radiation Oncology

## 2021-03-15 DIAGNOSIS — C342 Malignant neoplasm of middle lobe, bronchus or lung: Secondary | ICD-10-CM | POA: Insufficient documentation

## 2021-03-15 DIAGNOSIS — C7931 Secondary malignant neoplasm of brain: Secondary | ICD-10-CM | POA: Insufficient documentation

## 2021-03-15 DIAGNOSIS — Z510 Encounter for antineoplastic radiation therapy: Secondary | ICD-10-CM | POA: Diagnosis not present

## 2021-03-15 DIAGNOSIS — Z87891 Personal history of nicotine dependence: Secondary | ICD-10-CM | POA: Diagnosis not present

## 2021-03-15 DIAGNOSIS — T508X5D Adverse effect of diagnostic agents, subsequent encounter: Secondary | ICD-10-CM

## 2021-03-15 MED ORDER — PREDNISONE 50 MG PO TABS: ORAL_TABLET | ORAL | 3 refills | Status: DC

## 2021-03-16 ENCOUNTER — Other Ambulatory Visit: Payer: Self-pay

## 2021-03-16 ENCOUNTER — Ambulatory Visit
Admission: RE | Admit: 2021-03-16 | Discharge: 2021-03-16 | Disposition: A | Discharge status: Home / Self Care | Payer: Medicare HMO | Source: Ambulatory Visit | Attending: Radiation Oncology | Admitting: Radiation Oncology

## 2021-03-16 ENCOUNTER — Ambulatory Visit (HOSPITAL_COMMUNITY)
Admission: RE | Admit: 2021-03-16 | Discharge: 2021-03-16 | Disposition: A | Discharge status: Home / Self Care | Payer: Medicare HMO | Source: Ambulatory Visit | Attending: Internal Medicine | Admitting: Internal Medicine

## 2021-03-16 ENCOUNTER — Encounter (HOSPITAL_COMMUNITY): Payer: Self-pay

## 2021-03-16 DIAGNOSIS — J849 Interstitial pulmonary disease, unspecified: Secondary | ICD-10-CM | POA: Diagnosis not present

## 2021-03-16 DIAGNOSIS — C342 Malignant neoplasm of middle lobe, bronchus or lung: Secondary | ICD-10-CM | POA: Diagnosis not present

## 2021-03-16 DIAGNOSIS — I2510 Atherosclerotic heart disease of native coronary artery without angina pectoris: Secondary | ICD-10-CM | POA: Diagnosis not present

## 2021-03-16 DIAGNOSIS — Z87891 Personal history of nicotine dependence: Secondary | ICD-10-CM | POA: Diagnosis not present

## 2021-03-16 DIAGNOSIS — I700 Atherosclerosis of aorta: Secondary | ICD-10-CM | POA: Diagnosis not present

## 2021-03-16 DIAGNOSIS — Z510 Encounter for antineoplastic radiation therapy: Secondary | ICD-10-CM | POA: Diagnosis not present

## 2021-03-16 DIAGNOSIS — C7931 Secondary malignant neoplasm of brain: Secondary | ICD-10-CM | POA: Diagnosis not present

## 2021-03-16 DIAGNOSIS — C3490 Malignant neoplasm of unspecified part of unspecified bronchus or lung: Secondary | ICD-10-CM | POA: Diagnosis not present

## 2021-03-16 MED ORDER — IOHEXOL 300 MG/ML  SOLN
75.0000 mL | Freq: Once | INTRAMUSCULAR | Status: AC | PRN
  Administered 2021-03-16: 75 mL via INTRAVENOUS

## 2021-03-17 ENCOUNTER — Ambulatory Visit (HOSPITAL_COMMUNITY): Payer: Medicare HMO

## 2021-03-17 ENCOUNTER — Other Ambulatory Visit: Payer: Self-pay

## 2021-03-17 ENCOUNTER — Ambulatory Visit
Admission: RE | Admit: 2021-03-17 | Discharge: 2021-03-17 | Disposition: A | Discharge status: Home / Self Care | Payer: Medicare HMO | Source: Ambulatory Visit | Attending: Radiation Oncology | Admitting: Radiation Oncology

## 2021-03-17 DIAGNOSIS — Z510 Encounter for antineoplastic radiation therapy: Secondary | ICD-10-CM | POA: Diagnosis not present

## 2021-03-17 DIAGNOSIS — C342 Malignant neoplasm of middle lobe, bronchus or lung: Secondary | ICD-10-CM | POA: Diagnosis not present

## 2021-03-17 DIAGNOSIS — Z87891 Personal history of nicotine dependence: Secondary | ICD-10-CM | POA: Diagnosis not present

## 2021-03-17 DIAGNOSIS — C7931 Secondary malignant neoplasm of brain: Secondary | ICD-10-CM | POA: Diagnosis not present

## 2021-03-17 DIAGNOSIS — L659 Nonscarring hair loss, unspecified: Secondary | ICD-10-CM | POA: Diagnosis not present

## 2021-03-17 DIAGNOSIS — M533 Sacrococcygeal disorders, not elsewhere classified: Secondary | ICD-10-CM | POA: Diagnosis not present

## 2021-03-19 ENCOUNTER — Encounter (HOSPITAL_COMMUNITY): Payer: Self-pay | Admitting: Emergency Medicine

## 2021-03-19 ENCOUNTER — Other Ambulatory Visit: Payer: Self-pay

## 2021-03-19 ENCOUNTER — Emergency Department (HOSPITAL_COMMUNITY)
Admission: EM | Admit: 2021-03-19 | Discharge: 2021-03-19 | Disposition: A | Discharge status: Home / Self Care | Payer: Medicare HMO | Attending: Emergency Medicine | Admitting: Emergency Medicine

## 2021-03-19 DIAGNOSIS — I130 Hypertensive heart and chronic kidney disease with heart failure and stage 1 through stage 4 chronic kidney disease, or unspecified chronic kidney disease: Secondary | ICD-10-CM | POA: Insufficient documentation

## 2021-03-19 DIAGNOSIS — I5033 Acute on chronic diastolic (congestive) heart failure: Secondary | ICD-10-CM | POA: Insufficient documentation

## 2021-03-19 DIAGNOSIS — S61452A Open bite of left hand, initial encounter: Secondary | ICD-10-CM | POA: Diagnosis not present

## 2021-03-19 DIAGNOSIS — Z91040 Latex allergy status: Secondary | ICD-10-CM | POA: Diagnosis not present

## 2021-03-19 DIAGNOSIS — E1122 Type 2 diabetes mellitus with diabetic chronic kidney disease: Secondary | ICD-10-CM | POA: Diagnosis not present

## 2021-03-19 DIAGNOSIS — Z7984 Long term (current) use of oral hypoglycemic drugs: Secondary | ICD-10-CM | POA: Insufficient documentation

## 2021-03-19 DIAGNOSIS — E039 Hypothyroidism, unspecified: Secondary | ICD-10-CM | POA: Diagnosis not present

## 2021-03-19 DIAGNOSIS — N1831 Chronic kidney disease, stage 3a: Secondary | ICD-10-CM | POA: Diagnosis not present

## 2021-03-19 DIAGNOSIS — J449 Chronic obstructive pulmonary disease, unspecified: Secondary | ICD-10-CM | POA: Diagnosis not present

## 2021-03-19 DIAGNOSIS — Z79899 Other long term (current) drug therapy: Secondary | ICD-10-CM | POA: Insufficient documentation

## 2021-03-19 DIAGNOSIS — S6992XA Unspecified injury of left wrist, hand and finger(s), initial encounter: Secondary | ICD-10-CM | POA: Diagnosis present

## 2021-03-19 DIAGNOSIS — S61052A Open bite of left thumb without damage to nail, initial encounter: Secondary | ICD-10-CM | POA: Insufficient documentation

## 2021-03-19 DIAGNOSIS — Z85118 Personal history of other malignant neoplasm of bronchus and lung: Secondary | ICD-10-CM | POA: Insufficient documentation

## 2021-03-19 DIAGNOSIS — Z87891 Personal history of nicotine dependence: Secondary | ICD-10-CM | POA: Diagnosis not present

## 2021-03-19 DIAGNOSIS — S61552A Open bite of left wrist, initial encounter: Secondary | ICD-10-CM | POA: Diagnosis not present

## 2021-03-19 DIAGNOSIS — S51852A Open bite of left forearm, initial encounter: Secondary | ICD-10-CM | POA: Diagnosis not present

## 2021-03-19 DIAGNOSIS — W540XXA Bitten by dog, initial encounter: Secondary | ICD-10-CM | POA: Diagnosis not present

## 2021-03-19 MED ORDER — SULFAMETHOXAZOLE-TRIMETHOPRIM 800-160 MG PO TABS: 1.0000 | ORAL_TABLET | Freq: Two times a day (BID) | ORAL | 0 refills | Status: AC

## 2021-03-19 MED ORDER — SULFAMETHOXAZOLE-TRIMETHOPRIM 800-160 MG PO TABS
1.0000 | ORAL_TABLET | Freq: Once | ORAL | Status: AC
  Administered 2021-03-19: 1 via ORAL
  Filled 2021-03-19: qty 1

## 2021-03-19 MED ORDER — METRONIDAZOLE 500 MG PO TABS
500.0000 mg | ORAL_TABLET | Freq: Once | ORAL | Status: AC
  Administered 2021-03-19: 500 mg via ORAL
  Filled 2021-03-19: qty 1

## 2021-03-19 MED ORDER — METRONIDAZOLE 500 MG PO TABS: 500.0000 mg | ORAL_TABLET | Freq: Two times a day (BID) | ORAL | 0 refills | Status: DC

## 2021-03-19 NOTE — ED Notes (Signed)
Non-stick dressing applied.  No bleeding noted.

## 2021-03-19 NOTE — Discharge Instructions (Addendum)
Keep the wound clean and bandaged.  Clean the areas with mild soap and water.  Elevate your hand to help with swelling.  Take the antibiotics as directed until they are both finished.  Follow-up with your primary care provider for recheck or return to the emergency department for any worsening symptoms or signs of infection.

## 2021-03-19 NOTE — ED Provider Notes (Signed)
[facility] Provider Note   CSN: [id number] Arrival date & time: 03/19/21  1651     History Chief Complaint  Patient presents with  . Animal Bite    [name] is a 70 y.o. female.  HPI      [name] is a 70 y.o. 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  She states that her own dog bit her earlier today.  She has puncture wounds of the palmar surface of her left hand, dorsal thumb, and left wrist.  She states that the dog is an inside dog and immunizations are up-to-date.  She reports being bitten by the dog in the past.  Bite was unprovoked.  Her tetanus is up-to-date.  She denies numbness of her fingers or hand.  She has mild swelling of her distal left wrist.  She does not take any anticoagulants.   Past Medical History:  Diagnosis Date  . Acute on chronic diastolic CHF (congestive heart failure) ([place]) 02/28/2021  . Allergic rhinitis   . 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 Anemia   . Anxiety   . ASCVD (arteriosclerotic cardiovascular disease)    MI in 96 requiring BMS CX; DES to M1 in 2000;normal coronary angiography in 2004  . Cholelithiasis   . COPD (chronic obstructive pulmonary disease) ([place])   . DDD (degenerative disc disease), lumbar   . Depression   . Diabetes mellitus    Type II  . Diarrhea   . DVT (deep venous thrombosis) ([place])    patient said no  . GERD (gastroesophageal reflux disease)   . Headache    migraine  . History of kidney stones    2006  . History of radiation therapy 06/28/2020-08/08/2020   right Lung; Dr. [name]  . Hyperlipidemia   . Hypertension   . Hypotension   . Hypothyroidism   . Low back pain   . Myocardial infarction ([place]) 01/1995  . Nephrolithiasis 2006   stone extraction   . Panic attacks   .  Peripheral neuropathy   . Peripheral vascular disease (H[facility])    legs  . Pneumonia 12/22/2019  . Sciatic pain    right  . Small cell lung cancer ([place])   . Tobacco abuse   . Tremor     Patient Active Problem List   Diagnosis Date Noted  . Brain mass 02/28/2021  . Acute on chronic diastolic CHF (congestive heart failure) ([place]) 02/28/2021  . Brain metastases ([place])   . Small cell lung cancer, right middle lobe ([place]) 05/30/2020  . Encounter for antineoplastic chemotherapy 05/30/2020  . Goals of care, counseling/discussion 05/30/2020  . Intolerance to cold 05/23/2020  . Mediastinal adenopathy 05/20/2020  . Abnormal weight loss 05/17/2020  . IDA (iron deficiency anemia) 02/15/2020  . Hepatic cirrhosis ([place]) 02/15/2020  . CKD (chronic kidney disease), stage IIIa 01/09/2020  . Lung mass 01/09/2020  . Orthostatic hypotension 12/31/2019  . Non-ST elevation (NSTEMI) myocardial infarction ([place])   . Anemia   . Dyspnea   . Hx of heart artery stent   . Benign hypertension with CKD (chronic kidney disease) stage III ([place])   . Former smoker   . Chest pain 12/20/2019  . Weakness 09/04/2014  . Dizziness 09/04/2014  . Hepatomegaly 03/31/2014  . Esophageal dysphagia 03/31/2014  .  Unspecified constipation 03/31/2014  . AKI (acute kidney injury) ([place]) 10/30/2013  . Altered mental status 09/28/2013  . ETOH abuse 09/28/2013  . Altered mental state 09/28/2013  . Hyperkalemia 06/05/2013  . Bradycardia 06/05/2013  . Edema 06/02/2013  . Hyponatremia 06/02/2013  . Pneumonia due to infectious agent 12/26/2012  . Insomnia due to mental disorder 09/02/2012  . RECTAL PAIN 09/04/2010  . CHEST PAIN 08/22/2010  . Insulin dependent type 2 diabetes mellitus ([place]) 06/16/2010  . Coronary artery disease/Prior Stents 1997 and 2000/RCA occlusion 12/2019 06/16/2010  . MICROALBUMINURIA 06/01/2009  . 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 OTHER DYSPHAGIA 01/05/2009  . COLONIC POLYPS, ADENOMATOUS, HX OF  01/05/2009  . SCHATZKI'S RING, HX OF 01/05/2009  . BACK PAIN 08/04/2008  . DIARRHEA, CHRONIC 08/04/2008  . TOBACCO ABUSE 06/03/2007  . IBS 06/03/2007  . NEPHROLITHIASIS 02/04/2007  . ANXIETY STATE NOS 11/26/2006  . Hypothyroidism 11/01/2006  . Mixed hyperlipidemia 11/01/2006  . Depression 11/01/2006  . PERIPHERAL NEUROPATHY 11/01/2006  . CATARACT NOS 11/01/2006  . ALLERGIC RHINITIS 11/01/2006  . COPD (chronic obstructive pulmonary disease) ([place]) 11/01/2006  . GERD 11/01/2006  . DEGENERATION, DISC NOS 11/01/2006  . LOW BACK PAIN 11/01/2006  . DVT, HX OF 11/01/2006    Past Surgical History:  Procedure Laterality Date  . AGILE CAPSULE N/A 03/07/2020   Procedure: AGILE CAPSULE;  Surgeon: Daneil Dolin, MD;  [facility];  Service: Endoscopy;  Laterality: N/A;  7:30am  . BACK SURGERY     fusion  . BRONCHIAL NEEDLE ASPIRATION BIOPSY N/A 05/20/2020   Procedure: BRONCHIAL NEEDLE ASPIRATION BIOPSIES;  Surgeon: Collene Gobble, MD;  Location: [facility] ENDOSCOPY;  Service: Pulmonary;  Laterality: N/A;  . CHOLECYSTECTOMY    . COLONOSCOPY  01/2009   KGU:RKYHCW rectum/repeat in 5 yrs  . COLONOSCOPY N/A 04/29/2014   Dr.Rourk- attempted/incomplete colonoscopy. inadequate prep  . COLONOSCOPY N/A 05/27/2014   CBJ:SEGBTDVVO coli. Colonic polyps-removed as described above.Status post segmental biopsy. single tubular adenoma and random colon bx neg  . COLONOSCOPY WITH PROPOFOL N/A 12/23/2019   Procedure: COLONOSCOPY WITH PROPOFOL;  Surgeon: Ronnette Juniper, MD;  Location: [facility];  Service: Gastroenterology;  Laterality: N/A;  . DILATION AND CURETTAGE OF UTERUS  1974  . ESOPHAGOGASTRODUODENOSCOPY  05/2010   Dr. Tessie Fass, erosion. 77F dilation  . ESOPHAGOGASTRODUODENOSCOPY N/A 04/29/2014   Dr.Rourk- normal esophagus s/p passage of maloney dilator. small hiatal hernia- bx= chronic inflammation.  . ESOPHAGOGASTRODUODENOSCOPY (EGD) WITH PROPOFOL N/A 12/23/2019   Procedure: ESOPHAGOGASTRODUODENOSCOPY  (EGD) WITH PROPOFOL;  Surgeon: Ronnette Juniper, MD;  Location: [facility];  Service: Gastroenterology;  Laterality: N/A;  . GIVENS CAPSULE STUDY N/A 03/22/2020   Procedure: GIVENS CAPSULE STUDY;  Surgeon: Daneil Dolin, MD;  [facility];  Service: Endoscopy;  Laterality: N/A;  7:30am  . HEMOSTASIS CLIP PLACEMENT  12/23/2019   Procedure: HEMOSTASIS CLIP PLACEMENT;  Surgeon: Ronnette Juniper, MD;  Location: [facility];  Service: Gastroenterology;;  . HOT HEMOSTASIS N/A 12/23/2019   Procedure: HOT HEMOSTASIS (ARGON PLASMA COAGULATION/BICAP);  Surgeon: Ronnette Juniper, MD;  Location: [facility];  Service: Gastroenterology;  Laterality: N/A;  . LEFT HEART CATH AND CORONARY ANGIOGRAPHY N/A 12/24/2019   Procedure: LEFT HEART CATH AND CORONARY ANGIOGRAPHY;  Surgeon: Nigel Mormon, MD;  Location: [facility];  Service: Cardiovascular;  Laterality: N/A;  . Venia Minks DILATION N/A 04/29/2014   Procedure: Venia Minks DILATION;  Surgeon: Daneil Dolin, MD;  [facility];  Service: Endoscopy;  Laterality: N/A;  . PARTIAL  HYSTERECTOMY  1978  . POLYPECTOMY  12/23/2019   Procedure: POLYPECTOMY;  Surgeon: Ronnette Juniper, MD;  Location: [facility] ENDOSCOPY;  Service: Gastroenterology;;  . SHOULDER SURGERY Left    Left shoulder for RTC;left arm surgery '98/left hand surgery 2001  . TOTAL ABDOMINAL HYSTERECTOMY W/ BILATERAL SALPINGOOPHORECTOMY  2002  . UMBILICAL HERNIA REPAIR  2008  . VIDEO BRONCHOSCOPY WITH ENDOBRONCHIAL ULTRASOUND N/A 05/20/2020   Procedure: VIDEO BRONCHOSCOPY WITH ENDOBRONCHIAL ULTRASOUND;  Surgeon: Collene Gobble, MD;  Location: [facility] ENDOSCOPY;  Service: Pulmonary;  Laterality: N/A;     OB History    Gravida  3   Para  2   Term  2   Preterm      AB  1   Living  2     SAB  1   IAB      Ectopic      Multiple      Live Births              Family History  Problem Relation Age of Onset  . Depression Mother   . Bipolar disorder Mother   . Dementia Mother   .  Pulmonary fibrosis Mother   . Alcohol abuse Father   . Aneurysm Father        deceased age 1, brain  . Colon cancer Paternal Grandfather        age greater than 81    Social History   Tobacco Use  . Smoking status: Former Smoker    Packs/day: 0.50    Years: 40.00    Pack years: 20.00    Types: Cigarettes    Quit date: 08/30/2017    Years since quitting: 3.5  . Smokeless tobacco: Never Used  Vaping Use  . Vaping Use: Never used  Substance Use Topics  . Alcohol use: No  . Drug use: No    Home Medications Prior to Admission medications   Medication Sig Start Date End Date Taking? Authorizing Provider  metroNIDAZOLE (FLAGYL) 500 MG tablet Take 1 tablet (500 mg total) by mouth 2 (two) times daily. 03/19/21  Yes Queenie Aufiero, PA-C  sulfamethoxazole-trimethoprim (BACTRIM DS) 800-160 MG tablet Take 1 tablet by mouth 2 (two) times daily for 7 days. 03/19/21 03/26/21 Yes Aland Chestnutt, PA-C  acetaminophen (TYLENOL) 325 MG tablet Take 2 tablets (650 mg total) by mouth every 6 (six) hours as needed for mild pain (or Fever >/= 101). 03/03/21   Cherene Altes, MD  albuterol (VENTOLIN HFA) 108 (90 Base) MCG/ACT inhaler Inhale 2 puffs into the lungs every 4 (four) hours as needed for wheezing or shortness of breath. 11/17/20   Chesley Mires, MD  Alpha-Lipoic Acid 200 MG CAPS Take 200 mg by mouth daily.    [provider]  aspirin-acetaminophen-caffeine (EXCEDRIN MIGRAINE) [phone] MG tablet Take 2 tablets by mouth daily as needed for headache. Up to 4 times a week    [provider]  b complex vitamins tablet Take 1 tablet by mouth daily. With Zinc and vitamin C    [provider]  Benzocaine (BOIL-EASE EX) Apply 1 application topically daily as needed (Boil).    [provider]  Biotin 5000 MCG TABS Take 10,000 mcg by mouth daily. Keratin    [provider]  carbamide peroxide (DEBROX) 6.5 % OTIC solution Place 5 drops into both ears daily as  needed (ears).    [provider]  Cholecalciferol (VITAMIN D3) 50 MCG (2000 UT) TABS Take 8,000 Units by mouth daily.  [provider]  colestipol (COLESTID) 1 g tablet Take 3 tablets (3 g total) by mouth daily. Do not take within two hours of other medications. 12/14/20   Mahala Menghini, PA-C  Cyanocobalamin (VITAMIN B12) 1000 MCG TBCR 1 tablet    [provider]  dexamethasone (DECADRON) 4 MG tablet Take 1 tablet (4 mg total) by mouth every 12 (twelve) hours. 03/08/21   [name], MD  diazepam (VALIUM) 5 MG tablet Take 2.5 mg by mouth at bedtime. 1/2 tab qhs    [provider]  diclofenac Sodium (VOLTAREN) 1 % GEL Apply 2 g topically daily as needed (pain).    [provider]  Fluticasone-Umeclidin-Vilant (TRELEGY ELLIPTA) 100-62.5-25 MCG/INH AEPB Inhale 1 puff into the lungs daily. 05/31/20   Chesley Mires, MD  folic acid (FOLVITE) 1 MG tablet Take 1 mg by mouth daily.    [provider]  furosemide (LASIX) 40 MG tablet Take 1 tablet (40 mg total) by mouth daily. 03/03/21   Cherene Altes, MD  hydrocortisone cream 1 % Apply 1 application topically daily as needed for itching.    [provider]  Hydrocortisone, Perianal, (PROCTO-PAK) 1 % CREA Apply anorectally twice daily for two weeks. 07/12/20   Mahala Menghini, PA-C  levothyroxine (SYNTHROID) 112 MCG tablet Take 112 mcg by mouth daily. 12/02/20   [provider]  metFORMIN (GLUCOPHAGE) 500 MG tablet Take 1,000 mg by mouth 2 (two) times daily with a meal. Pt has been taking 2tabs BID. 09/18/19   [provider]  Multiple Vitamins-Minerals (MULTIVITAMIN WITH MINERALS) tablet Take 1 tablet by mouth daily. Adult 50+    [provider]  neomycin-bacitracin-polymyxin (NEOSPORIN) OINT Apply 1 application topically daily as needed for irritation or wound care.    [provider]  omega-3 acid ethyl esters (LOVAZA) 1 g capsule Take 1 g by mouth 2 (two)  times daily.    [provider]  OVER THE COUNTER MEDICATION Take 450 mg by mouth daily.    [provider]  oxyCODONE-acetaminophen (PERCOCET/ROXICET) 5-325 MG tablet Take 0.5 tablets by mouth 2 (two) times daily as needed for severe pain. 05/20/20   Collene Gobble, MD  pantoprazole (PROTONIX) 40 MG tablet Take 40 mg by mouth daily. 09/29/19   [provider]  PARoxetine (PAXIL) 20 MG tablet Take 20 mg by mouth daily. 01/12/21   [provider]  predniSONE (DELTASONE) 50 MG tablet Take one tablet 13 hours, one tablet 7 hours and 1 tablet 1 hour prior to CT scan. 03/15/21   Curt Bears, MD  Probiotic Product (PROBIOTIC PO) Take 1 capsule by mouth daily.    [provider]  rosuvastatin (CRESTOR) 20 MG tablet Take 20 mg by mouth daily as needed. 01/12/21   [provider]  traZODone (DESYREL) 150 MG tablet Take 75 mg by mouth at bedtime. 08/19/15   [provider]    Allergies    Iohexol, Tape, Betadine [povidone iodine], Ciprofloxacin, Latex, Penicillins, and Povidone-iodine  Review of Systems   Review of Systems  Constitutional: Negative for chills and fever.  Respiratory: Negative for shortness of breath.   Cardiovascular: Negative for chest pain.  Gastrointestinal: Negative for nausea and vomiting.  Musculoskeletal: Negative for arthralgias (Left wrist pain and swelling).  Skin: Positive for wound.       Puncture wounds of the left wrist, palmar surface of the hand and left thumb  Neurological: Negative for dizziness, weakness and numbness.  Hematological: Does  not bruise/bleed easily.    Physical Exam Updated Vital Signs BP 111/64 (BP Location: Right Arm)   Pulse 85   Temp 98 F (36.7 C) (Oral)   Resp 16   Ht 5' 6"  (1.676 m)   Wt 61.2 kg   SpO2 92%   BMI 21.79 kg/m   Physical Exam Vitals and nursing note reviewed.  Constitutional:      Appearance: Normal appearance.  HENT:     Head: Atraumatic.  Neck:      Thyroid: No thyromegaly.     Meningeal: Kernig's sign absent.  Cardiovascular:     Rate and Rhythm: Normal rate and regular rhythm.     Pulses: Normal pulses.  Pulmonary:     Effort: Pulmonary effort is normal.     Breath sounds: Normal breath sounds. No wheezing.  Musculoskeletal:        General: Swelling, tenderness and signs of injury present. Normal range of motion.     Comments: Patient has limited grip strength of the left hand which is baseline.  Able to fully extend the fingers of the left hand.  Tender to palpation of the ulnar aspect of the left wrist.  Mild edema noted.  No active bleeding.  Skin:    General: Skin is warm.     Capillary Refill: Capillary refill takes less than 2 seconds.     Findings: No rash.     Comments: Several small puncture wounds involving the left wrist, dorsal left thumb palmar surface left hand, no lacerations.  No active bleeding.  Neurological:     General: No focal deficit present.     Mental Status: She is alert.     Sensory: No sensory deficit.     Motor: No weakness.     ED Results / Procedures / Treatments   Labs (all labs ordered are listed, but only abnormal results are displayed) Labs Reviewed - No data to display  EKG None  Radiology No results found.  Procedures Procedures   Medications Ordered in ED Medications  metroNIDAZOLE (FLAGYL) tablet 500 mg (500 mg Oral Given 03/19/21 1821)  sulfamethoxazole-trimethoprim (BACTRIM DS) 800-160 MG per tablet 1 tablet (1 tablet Oral Given 03/19/21 1821)    ED Course  I have reviewed the triage vital signs and the nursing notes.  Pertinent labs & imaging results that were available during my care of the patient were reviewed by me and considered in my medical decision making (see chart for details).    MDM Rules/Calculators/A&P                          Patient here for evaluation of dog bite from her own dog.  States dog's immunizations are current and the dog does not go outside.   Her last tetanus is up-to-date.  Neurovascularly intact.  She has some limited range of motion of the fingers of the left hand which she states is baseline.  Wounds were cleaned by nursing staff and bandaged.  Patient does have penicillin allergy, so prescription given for metronidazole and Bactrim.  She agrees to close outpatient follow-up and wound care instructions were discussed and she is agreeable to turn to the emergency department if she develops any signs of infection.  Final Clinical Impression(s) / ED Diagnoses Final diagnoses:  Dog bite, initial encounter    Rx / DC Orders ED Discharge Orders         Ordered    metroNIDAZOLE (FLAGYL) 500  MG tablet  2 times daily        03/19/21 1818    sulfamethoxazole-trimethoprim (BACTRIM DS) 800-160 MG tablet  2 times daily        03/19/21 1818           Kem Parkinson, PA-C 03/19/21 1847    Milton Ferguson, MD 03/19/21 2321

## 2021-03-19 NOTE — ED Notes (Signed)
t-Dap is up to date per pt.

## 2021-03-19 NOTE — ED Notes (Signed)
Cleaned area with wound cleaner and applied ice-pack.  Bleeding controlled.  Bite noted to left hand and left wrist (inner and outer).

## 2021-03-19 NOTE — ED Triage Notes (Signed)
Pt c/o dog bite to left wrist/arm a few hours ago. States it was by her dog. States dog id up to date on rabies vaccines.

## 2021-03-20 ENCOUNTER — Inpatient Hospital Stay: Payer: Medicare HMO | Attending: Internal Medicine

## 2021-03-20 ENCOUNTER — Ambulatory Visit
Admission: RE | Admit: 2021-03-20 | Discharge: 2021-03-20 | Disposition: A | Discharge status: Home / Self Care | Payer: Medicare HMO | Source: Ambulatory Visit | Attending: Radiation Oncology | Admitting: Radiation Oncology

## 2021-03-20 DIAGNOSIS — C3490 Malignant neoplasm of unspecified part of unspecified bronchus or lung: Secondary | ICD-10-CM

## 2021-03-20 DIAGNOSIS — Z87891 Personal history of nicotine dependence: Secondary | ICD-10-CM | POA: Diagnosis not present

## 2021-03-20 DIAGNOSIS — M533 Sacrococcygeal disorders, not elsewhere classified: Secondary | ICD-10-CM | POA: Diagnosis not present

## 2021-03-20 DIAGNOSIS — C7931 Secondary malignant neoplasm of brain: Secondary | ICD-10-CM | POA: Insufficient documentation

## 2021-03-20 DIAGNOSIS — Z510 Encounter for antineoplastic radiation therapy: Secondary | ICD-10-CM | POA: Diagnosis not present

## 2021-03-20 DIAGNOSIS — L659 Nonscarring hair loss, unspecified: Secondary | ICD-10-CM | POA: Insufficient documentation

## 2021-03-20 DIAGNOSIS — C342 Malignant neoplasm of middle lobe, bronchus or lung: Secondary | ICD-10-CM | POA: Insufficient documentation

## 2021-03-20 LAB — CBC WITH DIFFERENTIAL (CANCER CENTER ONLY)
Abs Immature Granulocytes: 0.05 10*3/uL (ref 0.00–0.07)
Basophils Absolute: 0 10*3/uL (ref 0.0–0.1)
Basophils Relative: 0 %
Eosinophils Absolute: 0 10*3/uL (ref 0.0–0.5)
Eosinophils Relative: 0 %
HCT: 37.4 % (ref 36.0–46.0)
Hemoglobin: 12.2 g/dL (ref 12.0–15.0)
Immature Granulocytes: 1 %
Lymphocytes Relative: 3 %
Lymphs Abs: 0.3 10*3/uL — ABNORMAL LOW (ref 0.7–4.0)
MCH: 31 pg (ref 26.0–34.0)
MCHC: 32.6 g/dL (ref 30.0–36.0)
MCV: 95.2 fL (ref 80.0–100.0)
Monocytes Absolute: 0.4 10*3/uL (ref 0.1–1.0)
Monocytes Relative: 4 %
Neutro Abs: 8.5 10*3/uL — ABNORMAL HIGH (ref 1.7–7.7)
Neutrophils Relative %: 92 %
Platelet Count: 107 10*3/uL — ABNORMAL LOW (ref 150–400)
RBC: 3.93 MIL/uL (ref 3.87–5.11)
RDW: 13.6 % (ref 11.5–15.5)
WBC Count: 9.2 10*3/uL (ref 4.0–10.5)
nRBC: 0 % (ref 0.0–0.2)

## 2021-03-20 LAB — CMP (CANCER CENTER ONLY)
ALT: 57 U/L — ABNORMAL HIGH (ref 0–44)
AST: 15 U/L (ref 15–41)
Albumin: 3.5 g/dL (ref 3.5–5.0)
Alkaline Phosphatase: 51 U/L (ref 38–126)
Anion gap: 12 (ref 5–15)
BUN: 34 mg/dL — ABNORMAL HIGH (ref 8–23)
CO2: 27 mmol/L (ref 22–32)
Calcium: 9.7 mg/dL (ref 8.9–10.3)
Chloride: 95 mmol/L — ABNORMAL LOW (ref 98–111)
Creatinine: 1.47 mg/dL — ABNORMAL HIGH (ref 0.44–1.00)
GFR, Estimated: 38 mL/min — ABNORMAL LOW (ref >60)
Glucose, Bld: 320 mg/dL — ABNORMAL HIGH (ref 70–99)
Potassium: 4.9 mmol/L (ref 3.5–5.1)
Sodium: 134 mmol/L — ABNORMAL LOW (ref 135–145)
Total Bilirubin: 0.6 mg/dL (ref 0.3–1.2)
Total Protein: 6.9 g/dL (ref 6.5–8.1)

## 2021-03-21 ENCOUNTER — Encounter: Payer: Self-pay | Admitting: Radiology

## 2021-03-21 ENCOUNTER — Ambulatory Visit
Admission: RE | Admit: 2021-03-21 | Discharge: 2021-03-21 | Disposition: A | Discharge status: Home / Self Care | Payer: Medicare HMO | Source: Ambulatory Visit | Attending: Radiation Oncology | Admitting: Radiation Oncology

## 2021-03-21 ENCOUNTER — Other Ambulatory Visit: Payer: Self-pay

## 2021-03-21 DIAGNOSIS — Z87891 Personal history of nicotine dependence: Secondary | ICD-10-CM | POA: Diagnosis not present

## 2021-03-21 DIAGNOSIS — F339 Major depressive disorder, recurrent, unspecified: Secondary | ICD-10-CM | POA: Diagnosis not present

## 2021-03-21 DIAGNOSIS — C342 Malignant neoplasm of middle lobe, bronchus or lung: Secondary | ICD-10-CM | POA: Diagnosis not present

## 2021-03-21 DIAGNOSIS — Z510 Encounter for antineoplastic radiation therapy: Secondary | ICD-10-CM | POA: Diagnosis not present

## 2021-03-21 DIAGNOSIS — F411 Generalized anxiety disorder: Secondary | ICD-10-CM | POA: Diagnosis not present

## 2021-03-21 DIAGNOSIS — C7931 Secondary malignant neoplasm of brain: Secondary | ICD-10-CM | POA: Diagnosis not present

## 2021-03-21 NOTE — Progress Notes (Signed)
[facility] OFFICE PROGRESS NOTE  [name] Squibb, MD [address] [place] [address]  DIAGNOSIS: Extensive stage (T3, N2, M1c) small cell lung cancer presented with right middle lobe lung mass in addition to right hilar and low right paratracheal adenopathy in addition to tiny solitary brain metastasis diagnosed in August 2021.  PRIOR THERAPY: 1) Palliative systemic chemotherapy with carboplatin for AUC of 5 on day 1, etoposide 100 mg/M2 on days 1, 2 and 3 with Neulasta support.   Status post 4 cycles.  This is concurrent with radiotherapy which was completed.  CURRENT THERAPY: Whole brain radiation under the care of Dr. [name].  Last treatment expected on 03/22/2021  INTERVAL HISTORY: [name] 70 y.o. female returns to the clinic today for a follow-up visit accompanied by her friend.  The patient was initially diagnosed with small cell lung cancer in August 2021.  She completed systemic chemotherapy.  Following chemotherapy, the patient was offered prophylactic cranial irradiation on several occasions which the patient declined due to her fear of further dementia and she did not wish to proceed with this therapy.  However, she did wish to continue having frequent brain MRIs.  She was scheduled for her next brain MRI in July 2022.  However, the patient presented to the emergency room on 02/28/2021 for the chief complaint of headaches, dizziness, and emesis.  She had a CT scan of the head followed by a brain MRI which showed a 2.4 cm mass in the peripheral left cerebellar region with a large amount of edema which is consistent with a solitary brain metastasis.  The patient is currently undergoing whole brain radiation under the care of Dr. [name].  She is expected to complete this on 03/22/2021.  She is currently on dexamethasone for edema and knows the tapering instructions.  Otherwise, the patient is feeling fine today except for fatigue and generalized weakness from the  whole brain radiation. She also has alopecia and some skin rash on her head.  She denies any fever, chills, night sweats, or unexplained weight loss. He states she has a strong appetite and is "eating like a pig". She denies any chest pain, shortness of breath, or hemoptysis.  She reports her baseline mild cough. She denies any constipation. She has some baseline diarrhea which is unchanged. Her nausea and vomiting is resolved at this time.  Her headaches are resolved. The patient recently had a restaging CT scan the chest performed.  She is here today for evaluation to review her scan results.    MEDICAL HISTORY: Past Medical History:  Diagnosis Date  . Acute on chronic diastolic CHF (congestive heart failure) ([place]) 02/28/2021  . Allergic rhinitis   . 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 Anemia   . Anxiety   . ASCVD (arteriosclerotic cardiovascular disease)    MI in 96 requiring BMS CX; DES to M1 in 2000;normal coronary angiography in 2004  . Cholelithiasis   . COPD (chronic obstructive pulmonary disease) ([place])   . DDD (degenerative disc disease), lumbar   . Depression   . Diabetes mellitus    Type II  . Diarrhea   . DVT (deep venous thrombosis) ([place])    patient said no  . GERD (gastroesophageal reflux disease)   . Headache    migraine  . History of kidney stones    2006  . History of radiation therapy 06/28/2020-08/08/2020   right Lung; Dr. [name]  . Hyperlipidemia   .  Hypertension   . Hypotension   . Hypothyroidism   . Low back pain   . Myocardial infarction ([place]) 01/1995  . Nephrolithiasis 2006   stone extraction   . Panic attacks   . Peripheral neuropathy   . Peripheral vascular disease ([facility])    legs  . Pneumonia 12/22/2019  . Sciatic pain    right  . Small cell lung cancer ([place])   . Tobacco abuse   . Tremor     ALLERGIES:  is allergic to iohexol, tape, betadine [povidone iodine], ciprofloxacin, latex, penicillins, and  povidone-iodine.  MEDICATIONS:  Current Outpatient Medications  Medication Sig Dispense Refill  . acetaminophen (TYLENOL) 325 MG tablet Take 2 tablets (650 mg total) by mouth every 6 (six) hours as needed for mild pain (or Fever >/= 101).    Marland Kitchen albuterol (VENTOLIN HFA) 108 (90 Base) MCG/ACT inhaler Inhale 2 puffs into the lungs every 4 (four) hours as needed for wheezing or shortness of breath. 1 each 3  . Alpha-Lipoic Acid 200 MG CAPS Take 200 mg by mouth daily.    Marland Kitchen aspirin-acetaminophen-caffeine (EXCEDRIN MIGRAINE) 250-250-65 MG tablet Take 2 tablets by mouth daily as needed for headache. Up to 4 times a week    . b complex vitamins tablet Take 1 tablet by mouth daily. With Zinc and vitamin C    . Benzocaine (BOIL-EASE EX) Apply 1 application topically daily as needed (Boil).    . Biotin 5000 MCG TABS Take 10,000 mcg by mouth daily. Keratin    . carbamide peroxide (DEBROX) 6.5 % OTIC solution Place 5 drops into both ears daily as needed (ears).    . Cholecalciferol (VITAMIN D3) 50 MCG (2000 UT) TABS Take 8,000 Units by mouth daily.    . colestipol (COLESTID) 1 g tablet Take 3 tablets (3 g total) by mouth daily. Do not take within two hours of other medications. 90 tablet 5  . dexamethasone (DECADRON) 4 MG tablet Take 1 tablet (4 mg total) by mouth every 12 (twelve) hours. 28 tablet 0  . diazepam (VALIUM) 5 MG tablet Take 2.5 mg by mouth at bedtime. 1/2 tab qhs    . diclofenac Sodium (VOLTAREN) 1 % GEL Apply 2 g topically daily as needed (pain).    . Fluticasone-Umeclidin-Vilant (TRELEGY ELLIPTA) 100-62.5-25 MCG/INH AEPB Inhale 1 puff into the lungs daily. 28 each 5  . folic acid (FOLVITE) 1 MG tablet Take 1 mg by mouth daily.    . furosemide (LASIX) 40 MG tablet Take 1 tablet (40 mg total) by mouth daily. 30 tablet 1  . hydrocortisone cream 1 % Apply 1 application topically daily as needed for itching.    Marland Kitchen Hydrocortisone, Perianal, (PROCTO-PAK) 1 % CREA Apply anorectally twice daily for two  weeks. 28 g 0  . levothyroxine (SYNTHROID) 112 MCG tablet Take 112 mcg by mouth daily.    . metFORMIN (GLUCOPHAGE) 500 MG tablet Take 1,000 mg by mouth 2 (two) times daily with a meal. Pt has been taking 2tabs BID.    Marland Kitchen metroNIDAZOLE (FLAGYL) 500 MG tablet Take 1 tablet (500 mg total) by mouth 2 (two) times daily. 14 tablet 0  . Multiple Vitamins-Minerals (MULTIVITAMIN WITH MINERALS) tablet Take 1 tablet by mouth daily. Adult 50+    . neomycin-bacitracin-polymyxin (NEOSPORIN) OINT Apply 1 application topically daily as needed for irritation or wound care.    . omega-3 acid ethyl esters (LOVAZA) 1 g capsule Take 1 g by mouth 2 (two) times daily.    Marland Kitchen  oxyCODONE-acetaminophen (PERCOCET/ROXICET) 5-325 MG tablet Take 0.5 tablets by mouth 2 (two) times daily as needed for severe pain.    . pantoprazole (PROTONIX) 40 MG tablet Take 40 mg by mouth daily.    Marland Kitchen PARoxetine (PAXIL) 20 MG tablet Take 20 mg by mouth daily.    . predniSONE (DELTASONE) 50 MG tablet Take one tablet 13 hours, one tablet 7 hours and 1 tablet 1 hour prior to CT scan. 3 tablet 3  . Probiotic Product (PROBIOTIC PO) Take 1 capsule by mouth daily.    . rosuvastatin (CRESTOR) 20 MG tablet Take 20 mg by mouth daily as needed.    . sulfamethoxazole-trimethoprim (BACTRIM DS) 800-160 MG tablet Take 1 tablet by mouth 2 (two) times daily for 7 days. 14 tablet 0  . traZODone (DESYREL) 150 MG tablet Take 75 mg by mouth at bedtime.     No current facility-administered medications for this visit.    SURGICAL HISTORY:  Past Surgical History:  Procedure Laterality Date  . AGILE CAPSULE N/A 03/07/2020   Procedure: AGILE CAPSULE;  Surgeon: Daneil Dolin, MD;  [facility];  Service: Endoscopy;  Laterality: N/A;  7:30am  . BACK SURGERY     fusion  . BRONCHIAL NEEDLE ASPIRATION BIOPSY N/A 05/20/2020   Procedure: BRONCHIAL NEEDLE ASPIRATION BIOPSIES;  Surgeon: Collene Gobble, MD;  Location: [facility] ENDOSCOPY;  Service: Pulmonary;   Laterality: N/A;  . CHOLECYSTECTOMY    . COLONOSCOPY  01/2009   YJE:HUDJSH rectum/repeat in 5 yrs  . COLONOSCOPY N/A 04/29/2014   Dr.Rourk- attempted/incomplete colonoscopy. inadequate prep  . COLONOSCOPY N/A 05/27/2014   FWY:OVZCHYIFO coli. Colonic polyps-removed as described above.Status post segmental biopsy. single tubular adenoma and random colon bx neg  . COLONOSCOPY WITH PROPOFOL N/A 12/23/2019   Procedure: COLONOSCOPY WITH PROPOFOL;  Surgeon: Ronnette Juniper, MD;  Location: [place];  Service: Gastroenterology;  Laterality: N/A;  . DILATION AND CURETTAGE OF UTERUS  1974  . ESOPHAGOGASTRODUODENOSCOPY  05/2010   Dr. Tessie Fass, erosion. 64F dilation  . ESOPHAGOGASTRODUODENOSCOPY N/A 04/29/2014   Dr.Rourk- normal esophagus s/p passage of maloney dilator. small hiatal hernia- bx= chronic inflammation.  . ESOPHAGOGASTRODUODENOSCOPY (EGD) WITH PROPOFOL N/A 12/23/2019   Procedure: ESOPHAGOGASTRODUODENOSCOPY (EGD) WITH PROPOFOL;  Surgeon: Ronnette Juniper, MD;  Location: [place];  Service: Gastroenterology;  Laterality: N/A;  . GIVENS CAPSULE STUDY N/A 03/22/2020   Procedure: GIVENS CAPSULE STUDY;  Surgeon: Daneil Dolin, MD;  [facility];  Service: Endoscopy;  Laterality: N/A;  7:30am  . HEMOSTASIS CLIP PLACEMENT  12/23/2019   Procedure: HEMOSTASIS CLIP PLACEMENT;  Surgeon: Ronnette Juniper, MD;  Location: [place];  Service: Gastroenterology;;  . HOT HEMOSTASIS N/A 12/23/2019   Procedure: HOT HEMOSTASIS (ARGON PLASMA COAGULATION/BICAP);  Surgeon: Ronnette Juniper, MD;  Location: [place];  Service: Gastroenterology;  Laterality: N/A;  . LEFT HEART CATH AND CORONARY ANGIOGRAPHY N/A 12/24/2019   Procedure: LEFT HEART CATH AND CORONARY ANGIOGRAPHY;  Surgeon: Nigel Mormon, MD;  Location: [place] [facility];  Service: Cardiovascular;  Laterality: N/A;  . Venia Minks DILATION N/A 04/29/2014   Procedure: Venia Minks DILATION;  Surgeon: Daneil Dolin, MD;  [facility];  Service:  Endoscopy;  Laterality: N/A;  . PARTIAL HYSTERECTOMY  1978  . POLYPECTOMY  12/23/2019   Procedure: POLYPECTOMY;  Surgeon: Ronnette Juniper, MD;  Location: [facility] ENDOSCOPY;  Service: Gastroenterology;;  . SHOULDER SURGERY Left    Left shoulder for RTC;left arm surgery '98/left hand surgery 2001  . TOTAL ABDOMINAL HYSTERECTOMY W/ BILATERAL  SALPINGOOPHORECTOMY  2002  . UMBILICAL HERNIA REPAIR  2008  . VIDEO BRONCHOSCOPY WITH ENDOBRONCHIAL ULTRASOUND N/A 05/20/2020   Procedure: VIDEO BRONCHOSCOPY WITH ENDOBRONCHIAL ULTRASOUND;  Surgeon: Collene Gobble, MD;  Location: [facility] ENDOSCOPY;  Service: Pulmonary;  Laterality: N/A;    REVIEW OF SYSTEMS:   Review of Systems  Constitutional: Positive for fatigue. Negative for appetite change, chills, fever and unexpected weight change.  HENT:   Negative for mouth sores, nosebleeds, sore throat and trouble swallowing.   Eyes: Negative for eye problems and icterus.  Respiratory: Positive for baseline cough. Negative for hemoptysis, shortness of breath and wheezing.   Cardiovascular: Negative for chest pain and leg swelling.  Gastrointestinal: Positive for baseline diarrhea. Negative for abdominal pain, constipation, nausea and vomiting.  Genitourinary: Negative for bladder incontinence, difficulty urinating, dysuria, frequency and hematuria.   Musculoskeletal: Negative for back pain, gait problem, neck pain and neck stiffness.  Skin: Negative for itching and rash.  Neurological: Negative for dizziness, extremity weakness, gait problem, headaches, light-headedness and seizures.  Hematological: Negative for adenopathy. Does not bruise/bleed easily.  Psychiatric/Behavioral: Negative for confusion, depression and sleep disturbance. The patient is not nervous/anxious.     PHYSICAL EXAMINATION:  Blood pressure 101/84, pulse 72, temperature 98 F (36.7 C), temperature source Tympanic, resp. rate 16, height 5' 6"  (1.676 m), weight 136 lb 11.2 oz (62 kg), SpO2 97 %.  ECOG  PERFORMANCE STATUS: 1 - Symptomatic but completely ambulatory  Physical Exam  Constitutional: Oriented to person, place, and time and well-developed, well-nourished, and in no distress.  HENT:  Head: Normocephalic and atraumatic.  Mouth/Throat: Oropharynx is clear and moist. No oropharyngeal exudate.  Eyes: Conjunctivae are normal. Right eye exhibits no discharge. Left eye exhibits no discharge. No scleral icterus.  Neck: Normal range of motion. Neck supple.  Cardiovascular: Normal rate, regular rhythm, normal heart sounds and intact distal pulses.   Pulmonary/Chest: Effort normal. Mild wheezing. No respiratory distress.  No rales.  Abdominal: Soft. Bowel sounds are normal. Exhibits no distension and no mass. There is no tenderness.  Musculoskeletal: Absent right hand. Normal range of motion. Exhibits no edema.  Lymphadenopathy:    No cervical adenopathy.  Neurological: Alert and oriented to person, place, and time. Exhibits normal muscle tone. Gait normal. Uses a walker for ambulation.   Skin: Skin is warm and dry. No rash noted. Not diaphoretic. No erythema. No pallor.  Psychiatric: Mood, memory and judgment normal.  Vitals reviewed.  LABORATORY DATA: Lab Results  Component Value Date   WBC 9.2 03/20/2021   HGB 12.2 03/20/2021   HCT 37.4 03/20/2021   MCV 95.2 03/20/2021   PLT 107 (L) 03/20/2021      Chemistry      Component Value Date/Time   NA 134 (L) 03/20/2021 1013   NA 138 12/19/2020 1003   K 4.9 03/20/2021 1013   CL 95 (L) 03/20/2021 1013   CO2 27 03/20/2021 1013   BUN 34 (H) 03/20/2021 1013   BUN 15 12/19/2020 1003   CREATININE 1.47 (H) 03/20/2021 1013   CREATININE 0.85 05/03/2012 0942      Component Value Date/Time   CALCIUM 9.7 03/20/2021 1013   ALKPHOS 51 03/20/2021 1013   AST 15 03/20/2021 1013   ALT 57 (H) 03/20/2021 1013   BILITOT 0.6 03/20/2021 1013       RADIOGRAPHIC STUDIES:  CT Head Wo Contrast  Result Date: 02/28/2021 CLINICAL DATA:   Peripheral vertigo EXAM: CT HEAD WITHOUT CONTRAST TECHNIQUE: Contiguous axial images were obtained  from the base of the skull through the vertex without intravenous contrast. COMPARISON:  Most recent MRI 12/31/2020 CT 02/07/2015 FINDINGS: Brain: Marked hypoattenuation in the left cerebellar hemisphere with a volume positive process partially effacing the fourth ventricle extending from the midline cerebellum (4/9) concerning for enlarging underlying metastatic lesion in this patient with history of small cell lung cancer and previously demonstrated cerebellar metastatic focus in this vicinity. No supratentorial mass is CT evident. No evidence of acute infarction, hemorrhage, hydrocephalus. Insert white matter Symmetric prominence of the remaining ventricles, cisterns and sulci compatible with parenchymal volume loss. Vascular: Atherosclerotic calcification of the carotid siphons. No hyperdense vessel. Skull: No calvarial fracture or suspicious osseous lesion. No scalp swelling or hematoma. Sinuses/Orbits: Paranasal sinuses and mastoid air cells are predominantly clear. Prior right lens extraction. Orbital contents are otherwise unremarkable. Other: None. 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 Consider further characterization with a contrast enhanced MRI. These results were called by telephone at the time of interpretation on 02/28/2021 at 6:26 pm to provider [facility] , who verbally acknowledged these results. Electronically Signed   By: Lovena Le M.D.   On: 02/28/2021 18:26   CT Chest W Contrast  Result Date: 03/16/2021 CLINICAL DATA:  Follow-up metastatic small cell lung carcinoma. Previous chemotherapy. Undergoing radiation therapy. EXAM: CT CHEST WITH CONTRAST TECHNIQUE: Multidetector CT imaging of the chest was performed during intravenous contrast administration. The patient received a standard 13  hour steroid prep prior to the exam. CONTRAST:  48m OMNIPAQUE IOHEXOL 300 MG/ML  SOLN COMPARISON:  12/02/2020 FINDINGS: Cardiovascular: No acute findings. Aortic and coronary atherosclerotic calcification noted. Mediastinum/Nodes: Stable ill-defined soft tissue thickening in right parahilar region, without discrete lymphadenopathy. No other sites of lymphadenopathy identified. Lungs/Pleura: Chronic interstitial lung disease is again demonstrated. Soft tissue mass in posterior right middle lobe shows slight decrease in size, currently measuring 2.4 x 2.0 cm on image 79/5, compared to 2.9 x 2.1 cm previously. No new or enlarging pulmonary nodules or masses identified. No evidence of pleural effusion. Upper Abdomen: Normal adrenal glands. Findings of hepatic cirrhosis noted. Musculoskeletal:  No suspicious bone lesions. IMPRESSION: Slight decrease in size of posterior right middle lobe mass. Stable ill-defined soft tissue thickening in right parahilar region, without discrete lymphadenopathy. No new or progressive disease identified within the thorax. Chronic interstitial lung disease. Hepatic cirrhosis. Aortic Atherosclerosis (ICD10-I70.0). Electronically Signed   By: JMarlaine HindM.D.   On: 03/16/2021 12:27   MR Brain W and Wo Contrast  Result Date: 02/28/2021 CLINICAL DATA:  Vertigo EXAM: MRI HEAD WITHOUT AND WITH CONTRAST TECHNIQUE: Multiplanar, multiecho pulse sequences of the brain and surrounding structures were obtained without and with intravenous contrast. CONTRAST:  6.58mGADAVIST GADOBUTROL 1 MMOL/ML IV SOLN COMPARISON:  Head CT 02/28/2021 FINDINGS: Brain: No acute infarct, mass effect or extra-axial collection. No acute or chronic hemorrhage. There is multifocal hyperintense T2-weighted signal within the white matter. Parenchymal volume and CSF spaces are normal. Large amount of edema in the left cerebellar hemisphere. 2.4 cm contrast-enhancing mass in the peripheral left cerebellar hemisphere. The  midline structures are normal. Vascular: Major flow voids are preserved. Skull and upper cervical spine: Normal calvarium and skull base. Visualized upper cervical spine and soft tissues are normal. Sinuses/Orbits:No paranasal sinus fluid levels or advanced mucosal thickening. No mastoid or middle ear effusion. Normal orbits. IMPRESSION: 1. 2.4 cm contrast-enhancing mass in the peripheral left cerebellar hemisphere with large amount of edema. This  is most consistent with a solitary metastasis. No other lesions. 2. Findings of chronic microvascular ischemia. Electronically Signed   By: Ulyses Jarred M.D.   On: 02/28/2021 22:14   DG Chest Port 1 View  Result Date: 03/03/2021 CLINICAL DATA:  Pulmonary edema EXAM: PORTABLE CHEST 1 VIEW COMPARISON:  02/28/2021 FINDINGS: Asymmetric interstitial changes are again identified in keeping with chronic underlying interstitial lung disease. More focal opacity within the right lung base corresponds to the right basilar masslike opacity noted on CT examination of 12/02/2020. No new focal pulmonary infiltrate. No pneumothorax or pleural effusion. Cardiac size within normal limits. Pulmonary vascularity is normal. No acute bone abnormality. IMPRESSION: Stable chronic interstitial changes in keeping with underlying interstitial lung disease. Vague opacity within the right lung base corresponding to known pulmonary mass better seen on prior CT examination of 12/02/2020. Normal pulmonary vascularity. Electronically Signed   By: Fidela Salisbury MD   On: 03/03/2021 05:18   DG Chest Portable 1 View  Result Date: 02/28/2021 CLINICAL DATA:  Headache and dizziness x3 days. EXAM: PORTABLE CHEST 1 VIEW COMPARISON:  January 09, 2020 FINDINGS: Mild to moderate severity increased interstitial lung markings are seen. In ill-defined focal opacity is seen overlying the right lung base. This is present on the prior study. There is no evidence of a pleural effusion or pneumothorax. The heart size  and mediastinal contours are within normal limits. Mild calcification of the aortic arch is noted. The visualized skeletal structures are unremarkable. IMPRESSION: 1. Increased interstitial lung markings which are likely, in part, chronic in nature. A mild superimposed component of interstitial edema cannot be excluded. 2. Findings consistent with the patient's known RIGHT middle lobe pulmonary mass. Electronically Signed   By: Virgina Norfolk M.D.   On: 02/28/2021 17:40     ASSESSMENT/PLAN:  This is a very pleasant 70 year old Caucasian female diagnosed with extensive stage small cell lung cancer (T3, N2, M1 C).  She was diagnosed in August 2021.  She presented with a right middle lobe lung mass in addition to right hilar and low right paratracheal lymphadenopathy in addition to a tiny solitary brain metastasis.    Patient previously completed 4 cycles of systemic chemotherapy with carboplatin for an AUC of 5 on day 1 and etoposide 100 mg per metered squared with Neulasta support.  This was with concurrent radiotherapy. The patient had been on observation for several months.  There was resolution of the solitary brain metastasis on MRI of the brain.  The patient declined PCI due to concerns with dementia.  The patient presented to the emergency room on 02/28/2021 for the chief complaint of headaches, dizziness, and emesis.  Her known solitary brain metastasis enlarged to 2.4 cm.  She is currently undergoing whole brain radiation under the care of Dr. [name] and her last treatment is expected on 03/22/2021. She is on a tapering dose of decadron. Her symptoms resolved at this time except for fatigue, alopecia, and generalized weakness.  The patient was seen with Dr. [name] today.  The patient recently had a restaging CT scan performed.  Dr. [name] personally and independently reviewed the scan and discussed the results with the patient today.  The scan did not show any evidence of disease  progression.  Dr. [name] recommends that the patient continue on observation with a restaging CT scan of the chest in 3 months. I will order the scan with contast. However, if her creatinine is >1.5 with the labs before her scan, then we can hold  the IV contrast.  We will see her back at that time for follow-up visit and to review her scan results.  The patient was advised to call immediately if she has any concerning symptoms in the interval. The patient voices understanding of current disease status and treatment options and is in agreement with the current care plan. All questions were answered. The patient knows to call the clinic with any problems, questions or concerns. We can certainly see the patient much sooner if necessary      Orders Placed This Encounter  Procedures  . CT Chest W Contrast    Ok to hold contrast if creatinine is 1.5 and above    Standing Status:   Future    Standing Expiration Date:   03/22/2022    Order Specific Question:   If indicated for the ordered procedure, I authorize the administration of contrast media per Radiology protocol    Answer:   Yes    Order Specific Question:   Preferred imaging location?    Answer:   [facility]  . CT Abdomen Pelvis W Contrast    Standing Status:   Future    Standing Expiration Date:   03/22/2022    Order Specific Question:   If indicated for the ordered procedure, I authorize the administration of contrast media per Radiology protocol    Answer:   Yes    Order Specific Question:   Preferred imaging location?    Answer:   [facility]    Order Specific Question:   Is Oral Contrast requested for this exam?    Answer:   Yes, Per Radiology protocol  . CBC with Differential (Cancer Center Only)    Standing Status:   Future    Standing Expiration Date:   03/22/2022  . CMP ([place] only)    Standing Status:   Future    Standing Expiration Date:   03/22/2022       Tobe Sos Honesti Seaberg,  PA-C 03/22/21  ADDENDUM: Hematology/Oncology Attending: I had a face-to-face encounter with the patient today.  I reviewed her records, lab and scan and recommended her care plan.  This is a very pleasant 70 years old white female diagnosed with extensive stage small cell lung cancer in August 2021 status post 4 cycles of systemic chemotherapy with carboplatin, etoposide with Neulasta support.  She had resolution of the solitary brain metastasis at that time the patient has been doing fine and she declined prophylactic cranial irradiation.  The patient was seen at [facility] on Feb 28, 2021 with headache, dizziness and emesis and MRI of the brain showed solitary brain metastasis enlarged to 2.4 cm with vasogenic edema.  The patient was seen by Dr. [name] and started treatment with whole brain irradiation completed earlier today. She is feeling fine except for the alopecia. She had repeat CT scan of the chest performed recently.  I personally and independently reviewed the scans and discussed the results with the patient today. Her scan showed no concerning findings for disease recurrence or metastasis in the chest. I recommended for the patient to continue on observation with repeat CT scan of the chest, abdomen pelvis in 3 months. She was advised to call if she has any concerning symptoms in the interval. The total time spent in the appointment was 30 minutes. Disclaimer: This note was dictated with voice recognition software. Similar sounding words can inadvertently be transcribed and may be missed upon review. Eilleen Kempf, MD 03/22/21

## 2021-03-22 ENCOUNTER — Other Ambulatory Visit: Payer: Self-pay

## 2021-03-22 ENCOUNTER — Inpatient Hospital Stay (HOSPITAL_BASED_OUTPATIENT_CLINIC_OR_DEPARTMENT_OTHER): Payer: Medicare HMO | Admitting: Physician Assistant

## 2021-03-22 ENCOUNTER — Ambulatory Visit
Admission: RE | Admit: 2021-03-22 | Discharge: 2021-03-22 | Disposition: A | Discharge status: Home / Self Care | Payer: Medicare HMO | Source: Ambulatory Visit | Attending: Radiation Oncology | Admitting: Radiation Oncology

## 2021-03-22 ENCOUNTER — Encounter: Payer: Self-pay | Admitting: Radiation Oncology

## 2021-03-22 ENCOUNTER — Encounter: Payer: Self-pay | Admitting: Physician Assistant

## 2021-03-22 VITALS — BP 101/84 | HR 72 | Temp 98.0°F | Resp 16 | Ht 66.0 in | Wt 136.7 lb

## 2021-03-22 DIAGNOSIS — Z87891 Personal history of nicotine dependence: Secondary | ICD-10-CM | POA: Diagnosis not present

## 2021-03-22 DIAGNOSIS — L659 Nonscarring hair loss, unspecified: Secondary | ICD-10-CM | POA: Diagnosis not present

## 2021-03-22 DIAGNOSIS — Z510 Encounter for antineoplastic radiation therapy: Secondary | ICD-10-CM | POA: Diagnosis not present

## 2021-03-22 DIAGNOSIS — L2381 Allergic contact dermatitis due to animal (cat) (dog) dander: Secondary | ICD-10-CM | POA: Diagnosis not present

## 2021-03-22 DIAGNOSIS — C342 Malignant neoplasm of middle lobe, bronchus or lung: Secondary | ICD-10-CM | POA: Diagnosis not present

## 2021-03-22 DIAGNOSIS — W5581XD Bitten by other mammals, subsequent encounter: Secondary | ICD-10-CM | POA: Diagnosis not present

## 2021-03-22 DIAGNOSIS — Z923 Personal history of irradiation: Secondary | ICD-10-CM

## 2021-03-22 DIAGNOSIS — C7931 Secondary malignant neoplasm of brain: Secondary | ICD-10-CM | POA: Diagnosis not present

## 2021-03-22 HISTORY — DX: Personal history of irradiation: Z92.3

## 2021-03-23 DIAGNOSIS — F411 Generalized anxiety disorder: Secondary | ICD-10-CM | POA: Diagnosis not present

## 2021-03-23 DIAGNOSIS — F339 Major depressive disorder, recurrent, unspecified: Secondary | ICD-10-CM | POA: Diagnosis not present

## 2021-03-24 ENCOUNTER — Telehealth: Payer: Self-pay | Admitting: Physician Assistant

## 2021-03-24 NOTE — Telephone Encounter (Signed)
Scheduled per los. Called and left msg. Mailed printout  °

## 2021-03-27 ENCOUNTER — Ambulatory Visit: Payer: Medicare HMO | Admitting: Cardiology

## 2021-03-27 DIAGNOSIS — C3490 Malignant neoplasm of unspecified part of unspecified bronchus or lung: Secondary | ICD-10-CM | POA: Insufficient documentation

## 2021-03-27 DIAGNOSIS — C7931 Secondary malignant neoplasm of brain: Secondary | ICD-10-CM | POA: Insufficient documentation

## 2021-03-27 NOTE — Progress Notes (Signed)
Error

## 2021-03-29 IMAGING — CT CT CHEST W/ CM
2 of 4 series · 15 of 36 positions shown, 18 images · IV contrast (OMNIPAQUE)
Comparison: CT chest, 05/12/2020, PET-CT, 05/02/2020

CLINICAL DATA: Small-cell lung cancer restaging

EXAM:
CT CHEST WITH CONTRAST
TECHNIQUE: Multidetector CT imaging of the chest was performed during
intravenous contrast administration.
CONTRAST:  75mL OMNIPAQUE IOHEXOL 300 MG/ML  SOLN

[Series 2: axial st · axial · 0.71mm/px · z∈[-326,-56]mm · 12 of 159 slices shown, 15 images]
[im 12/159  mediastinal]
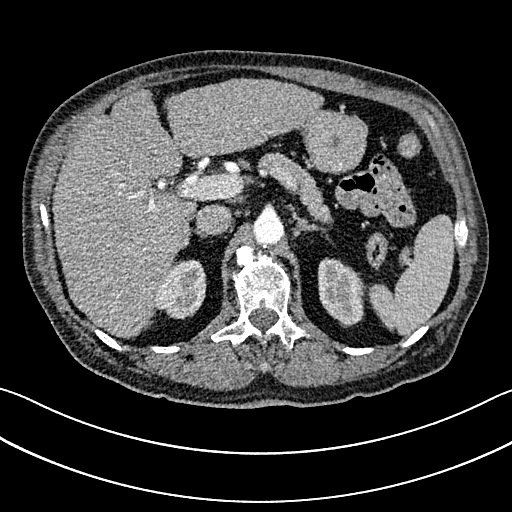
[im 12/159  lung]
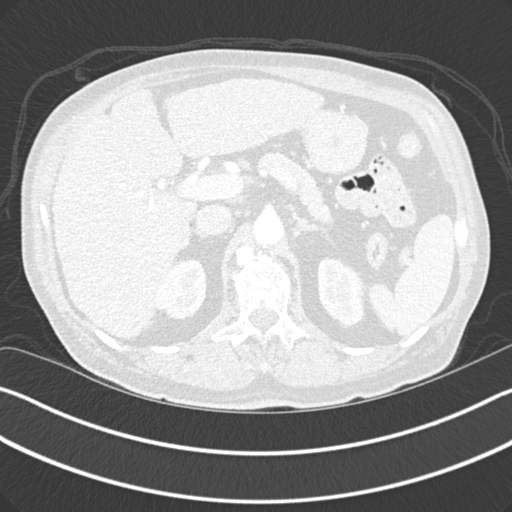
[im 23/159  lung]
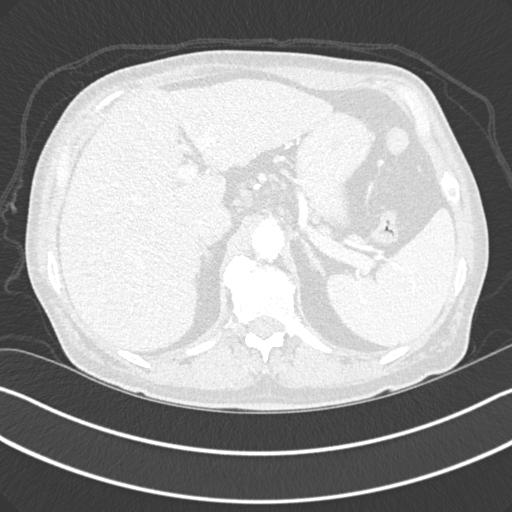
[im 34/159  lung]
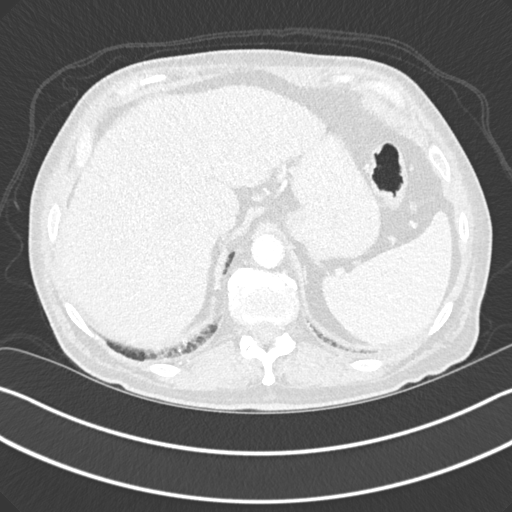
[im 46/159  lung]
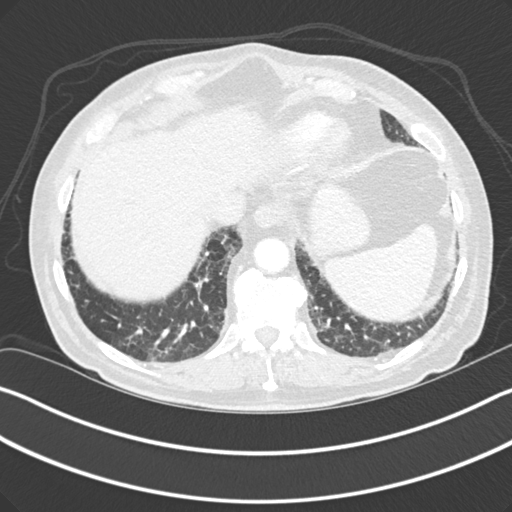
[im 57/159  mediastinal]
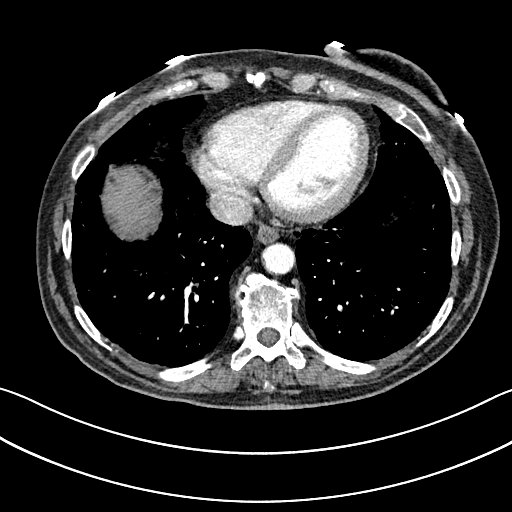
[im 57/159  lung]
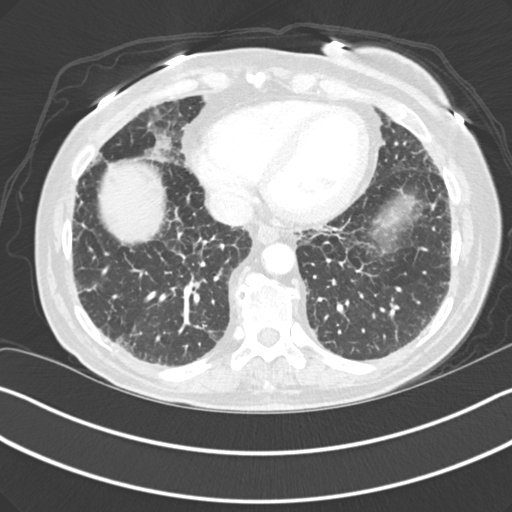
[im 68/159  lung]
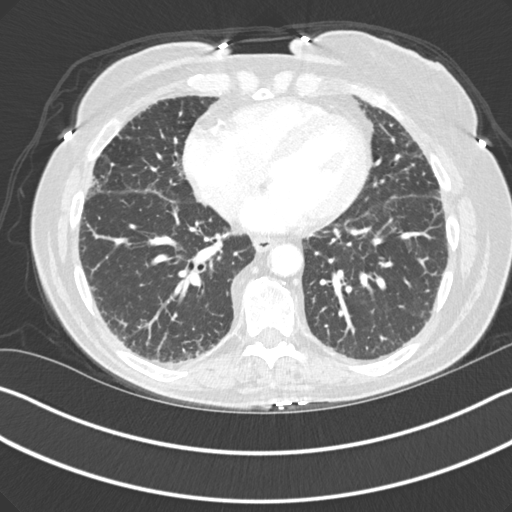
[im 91/159  lung]
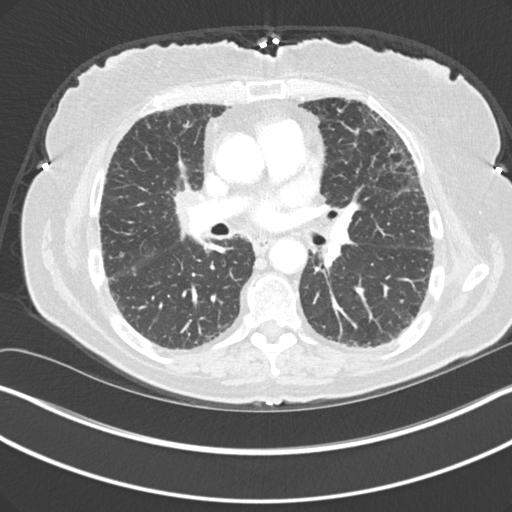
[im 102/159  lung]
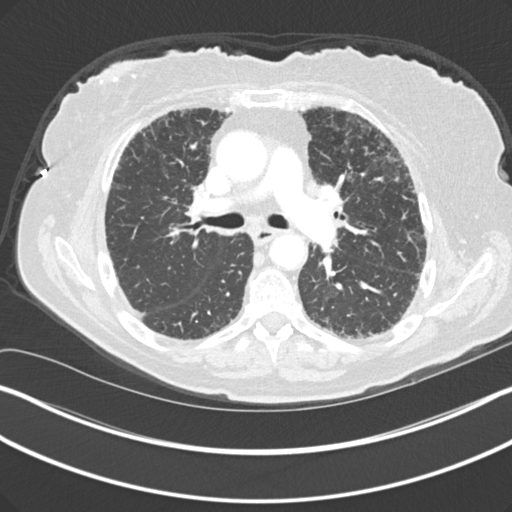
[im 113/159  mediastinal]
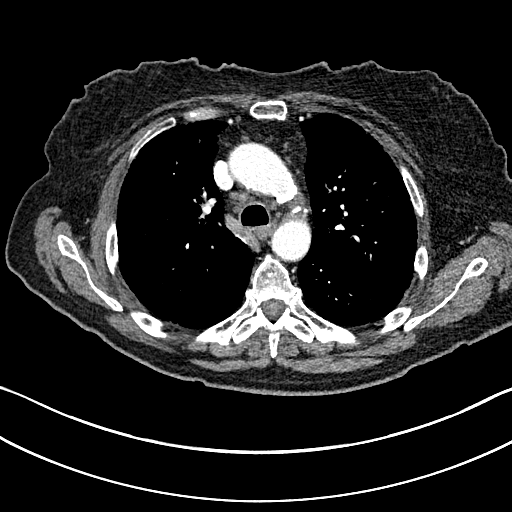
[im 113/159  lung]
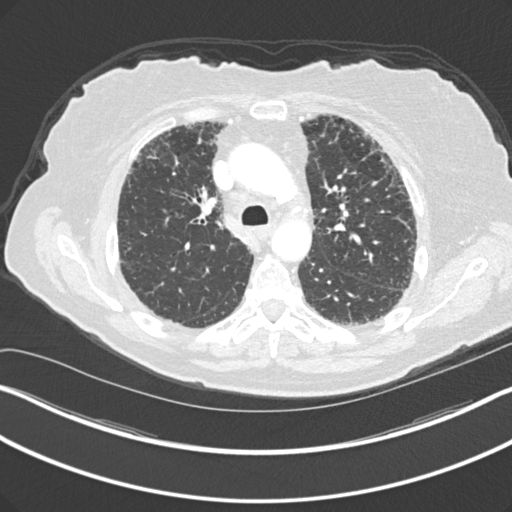
[im 125/159  lung]
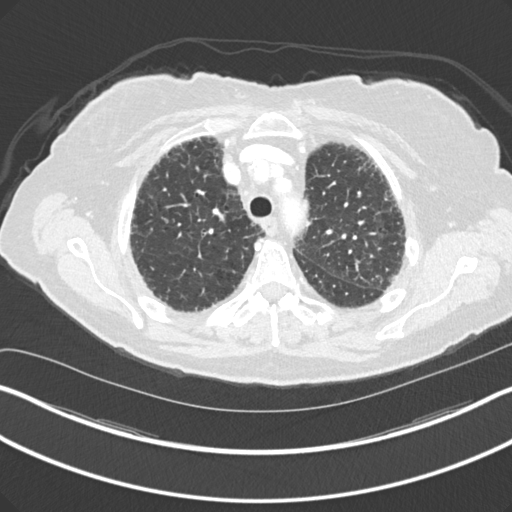
[im 136/159  lung]
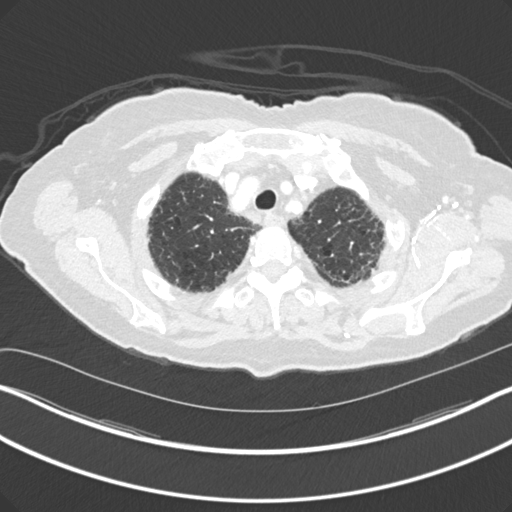
[im 147/159  lung]
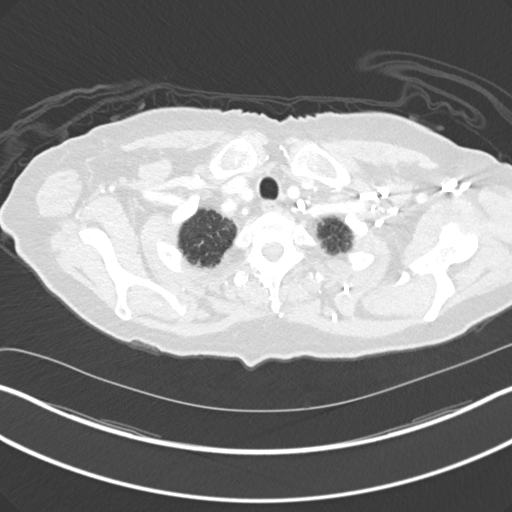

[Series 5: coronal · coronal · 0.75mm/px · 3 of 123 slices shown]
[im 25/123  lung]
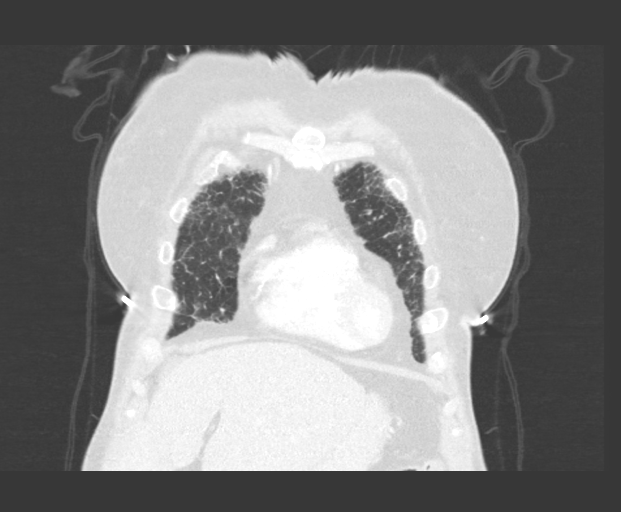
[im 49/123  lung]
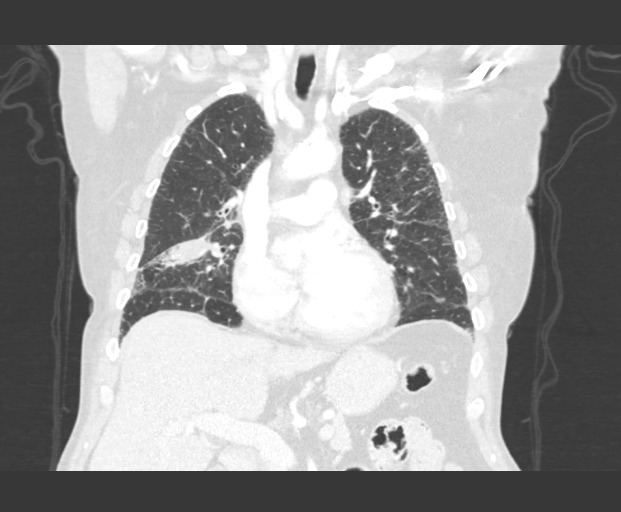
[im 74/123  lung]
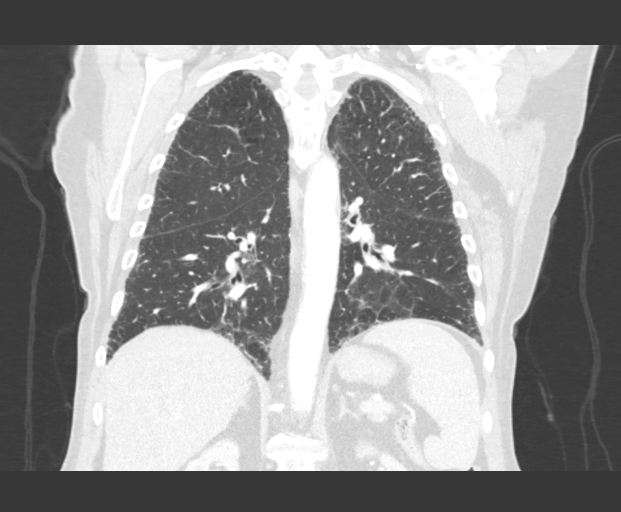

[15 of 36 positions shown; findings below may reference images not displayed]

FINDINGS: Cardiovascular: Aortic arthrosclerosis. Normal heart size.
Three-vessel coronary artery calcifications. No pericardial
effusion.

Mediastinum/Nodes: Interval reduction in size of enlarged, FDG avid
pretracheal and right hilar lymph nodes, index pretracheal node
measuring 1.2 x 1.0 cm, previously 1.6 x 1.5 cm (series 2, image
43). Thyroid gland, trachea, and esophagus demonstrate no
significant findings.

Lungs/Pleura: Interval decrease in size of a mass of the lateral
segment right middle lobe measuring approximately 3.5 x 3.5 cm,
previously at least 5.4 x 4.4 cm (series 2, image 77). Mild
centrilobular and paraseptal emphysema. Extensive peripheral
irregular interstitial opacity in a pattern without clear apical to
basal gradient. No pleural effusion or pneumothorax.

Upper Abdomen: No acute abnormality.

Musculoskeletal: No chest wall mass or suspicious bone lesions
identified.
IMPRESSION: 1. Interval decrease in size of an FDG avid mass of the lateral
segment right middle lobe.
2. Interval reduction in size of enlarged, FDG avid pretracheal and
right hilar lymph nodes.
3. Findings are consistent with treatment response of primary lung
mass and nodal metastatic disease.
4. Extensive peripheral irregular interstitial opacity in a pattern
without clear apical to basal gradient, consistent with mild
pulmonary fibrosis, in an "indeterminate for UIP" pattern if
characterized by ATS pulmonary fibrosis criteria. Attention on
follow-up for stability of fibrotic findings and pattern.
5. Emphysema ([id number].T).
6. Coronary artery disease. Aortic Atherosclerosis (9FR4D-[id number].E).

## 2021-04-11 DIAGNOSIS — F339 Major depressive disorder, recurrent, unspecified: Secondary | ICD-10-CM | POA: Diagnosis not present

## 2021-04-11 DIAGNOSIS — F411 Generalized anxiety disorder: Secondary | ICD-10-CM | POA: Diagnosis not present

## 2021-04-12 ENCOUNTER — Telehealth: Payer: Self-pay | Admitting: *Deleted

## 2021-04-12 NOTE — Telephone Encounter (Signed)
Called patient's friend Alexis Frock to inform that fu for 04-24-21 needs to be cancelled due to Dr. [name] being in the OR, patient has another fu on 05-08-21, lvm for a return call

## 2021-04-13 DIAGNOSIS — E1165 Type 2 diabetes mellitus with hyperglycemia: Secondary | ICD-10-CM | POA: Diagnosis not present

## 2021-04-13 DIAGNOSIS — K219 Gastro-esophageal reflux disease without esophagitis: Secondary | ICD-10-CM | POA: Diagnosis not present

## 2021-04-24 ENCOUNTER — Ambulatory Visit
Admission: EM | Admit: 2021-04-24 | Discharge: 2021-04-24 | Disposition: A | Discharge status: Home / Self Care | Payer: Medicare HMO | Attending: Family Medicine | Admitting: Family Medicine

## 2021-04-24 ENCOUNTER — Encounter: Payer: Self-pay | Admitting: Emergency Medicine

## 2021-04-24 ENCOUNTER — Encounter: Payer: Self-pay | Admitting: Internal Medicine

## 2021-04-24 ENCOUNTER — Ambulatory Visit: Payer: Medicare HMO | Admitting: Radiation Oncology

## 2021-04-24 DIAGNOSIS — L089 Local infection of the skin and subcutaneous tissue, unspecified: Secondary | ICD-10-CM

## 2021-04-24 DIAGNOSIS — W540XXA Bitten by dog, initial encounter: Secondary | ICD-10-CM

## 2021-04-24 DIAGNOSIS — S61432A Puncture wound without foreign body of left hand, initial encounter: Secondary | ICD-10-CM | POA: Diagnosis not present

## 2021-04-24 MED ORDER — SULFAMETHOXAZOLE-TRIMETHOPRIM 800-160 MG PO TABS: 1.0000 | ORAL_TABLET | Freq: Two times a day (BID) | ORAL | 0 refills | Status: DC

## 2021-04-24 NOTE — ED Triage Notes (Signed)
Dog bite on July 4th to left hand.  Patient thinks the area may be infected.

## 2021-04-24 NOTE — ED Provider Notes (Signed)
[facility]    CSN: [id number] Arrival date & time: 04/24/21  1223      History   Chief Complaint No chief complaint on file.   HPI [name] is a 70 y.o. female.   HPI Patient is concern for left hand infection related to her personal dog biting her. She has attempted to manage wound with OTC medication without relief.  She is a diabetic. Denies fever, nausea, or vomiting. Patient presents today for evaluation of dog bite wound to the left hand.  Patient was bitten by her personal dog on July 4 and has attempted to management at home without treatment.  She presents today she is concerned she has developed a secondary infection from the dog bite.  She is able to move and feel her fingers and hand.  She sustained multiple superficial abrasive and puncture wounds from the animal bite. The dog is up to date on immunizations. TDAP up to date.  Past Medical History:  Diagnosis Date   Acute on chronic diastolic CHF (congestive heart failure) ([place]) 02/28/2021   Allergic rhinitis    Amputation of hand, right (HCC)    traumatic   Anemia    Anxiety    ASCVD (arteriosclerotic cardiovascular disease)    MI in 96 requiring BMS CX; DES to M1 in 2000;normal coronary angiography in 2004   Cholelithiasis    COPD (chronic obstructive pulmonary disease) (HCC)    DDD (degenerative disc disease), lumbar    Depression    Diabetes mellitus    Type II   Diarrhea    DVT (deep venous thrombosis) ([place])    patient said no   GERD (gastroesophageal reflux disease)    Headache    migraine   History of kidney stones    2006   History of radiation therapy 06/28/2020-08/08/2020   right Lung; Dr. 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   Brain mass 02/28/2021   Acute on chronic diastolic CHF (congestive heart failure) ([place]) 02/28/2021   Brain metastases ([place])    Small cell lung cancer, right middle lobe ([place]) 05/30/2020   Encounter for antineoplastic chemotherapy 05/30/2020   Goals of care, counseling/discussion 05/30/2020   Intolerance to cold 05/23/2020   Mediastinal adenopathy 05/20/2020   Abnormal weight loss 05/17/2020   IDA (iron deficiency anemia) 02/15/2020   Hepatic cirrhosis ([place]) 02/15/2020   CKD (chronic kidney disease), stage IIIa 01/09/2020   Lung mass 01/09/2020   Orthostatic hypotension 12/31/2019   Non-ST elevation (NSTEMI) myocardial infarction ([place])    Anemia    Dyspnea    Hx of heart artery stent    Benign hypertension with CKD (chronic kidney disease) stage III ([place])    Former smoker    Chest pain 12/20/2019   Weakness 09/04/2014   Dizziness 09/04/2014   Hepatomegaly 03/31/2014   Esophageal dysphagia 03/31/2014   Unspecified constipation 03/31/2014   AKI (acute  kidney injury) ([place]) 10/30/2013   Altered mental status 09/28/2013   ETOH abuse 09/28/2013   Altered mental state 09/28/2013   Hyperkalemia 06/05/2013   Bradycardia 06/05/2013   Edema 06/02/2013   Hyponatremia 06/02/2013   Pneumonia due to infectious agent 12/26/2012   Insomnia due to mental disorder 09/02/2012   RECTAL PAIN 09/04/2010   CHEST PAIN 08/22/2010   Insulin dependent type 2 diabetes mellitus ([place]) 06/16/2010   Coronary artery disease/Prior Stents 1997 and 2000/RCA occlusion 12/2019 06/16/2010   MICROALBUMINURIA 06/01/2009   ACTINIC KERATOSIS, HEAD 04/20/2009   OTHER DYSPHAGIA 01/05/2009   COLONIC POLYPS, ADENOMATOUS, HX OF 01/05/2009   SCHATZKI'S RING, HX OF 01/05/2009   BACK PAIN 08/04/2008   DIARRHEA, CHRONIC  08/04/2008   TOBACCO ABUSE 06/03/2007   IBS 06/03/2007   NEPHROLITHIASIS 02/04/2007   ANXIETY STATE NOS 11/26/2006   Hypothyroidism 11/01/2006   Mixed hyperlipidemia 11/01/2006   Depression 11/01/2006   PERIPHERAL NEUROPATHY 11/01/2006   CATARACT NOS 11/01/2006   ALLERGIC RHINITIS 11/01/2006   COPD (chronic obstructive pulmonary disease) ([place]) 11/01/2006   GERD 11/01/2006   DEGENERATION, DISC NOS 11/01/2006   LOW BACK PAIN 11/01/2006   DVT, HX OF 11/01/2006    Past Surgical History:  Procedure Laterality Date   AGILE CAPSULE N/A 03/07/2020   Procedure: AGILE CAPSULE;  Surgeon: Daneil Dolin, MD;  [facility];  Service: Endoscopy;  Laterality: N/A;  7:30am   BACK SURGERY     fusion   BRONCHIAL NEEDLE ASPIRATION BIOPSY N/A 05/20/2020   Procedure: BRONCHIAL NEEDLE ASPIRATION BIOPSIES;  Surgeon: Collene Gobble, MD;  Location: [facility];  Service: Pulmonary;  Laterality: N/A;   CHOLECYSTECTOMY     COLONOSCOPY  01/2009   YYQ:MGNOIB rectum/repeat in 5 yrs   COLONOSCOPY N/A 04/29/2014   Dr.Rourk- attempted/incomplete colonoscopy. inadequate prep   COLONOSCOPY N/A 05/27/2014   BCW:UGQBVQXIH coli. Colonic polyps-removed as described above.Status post segmental biopsy. single tubular adenoma and random colon bx neg   COLONOSCOPY WITH PROPOFOL N/A 12/23/2019   Procedure: COLONOSCOPY WITH PROPOFOL;  Surgeon: Ronnette Juniper, MD;  Location: [place];  Service: Gastroenterology;  Laterality: N/A;   DILATION AND CURETTAGE OF UTERUS  1974   ESOPHAGOGASTRODUODENOSCOPY  05/2010   Dr. Tessie Fass, erosion. 40F dilation   ESOPHAGOGASTRODUODENOSCOPY N/A 04/29/2014   Dr.Rourk- normal esophagus s/p passage of maloney dilator. small hiatal hernia- bx= chronic inflammation.   ESOPHAGOGASTRODUODENOSCOPY (EGD) WITH PROPOFOL N/A 12/23/2019   Procedure: ESOPHAGOGASTRODUODENOSCOPY (EGD) WITH PROPOFOL;  Surgeon: Ronnette Juniper, MD;  Location: [place];  Service: Gastroenterology;  Laterality: N/A;    GIVENS CAPSULE STUDY N/A 03/22/2020   Procedure: GIVENS CAPSULE STUDY;  Surgeon: Daneil Dolin, MD;  [facility];  Service: Endoscopy;  Laterality: N/A;  7:30am   HEMOSTASIS CLIP PLACEMENT  12/23/2019   Procedure: HEMOSTASIS CLIP PLACEMENT;  Surgeon: Ronnette Juniper, MD;  Location: [place];  Service: Gastroenterology;;   HOT HEMOSTASIS N/A 12/23/2019   Procedure: HOT HEMOSTASIS (ARGON PLASMA COAGULATION/BICAP);  Surgeon: Ronnette Juniper, MD;  Location: [place];  Service: Gastroenterology;  Laterality: N/A;   LEFT HEART CATH AND CORONARY ANGIOGRAPHY N/A 12/24/2019   Procedure: LEFT HEART CATH AND CORONARY ANGIOGRAPHY;  Surgeon: Nigel Mormon, MD;  Location: [place] [facility];  Service: Cardiovascular;  Laterality: N/A;   MALONEY DILATION N/A 04/29/2014   Procedure: Venia Minks DILATION;  Surgeon: Daneil Dolin, MD;  [facility];  Service: Endoscopy;  Laterality: N/A;   PARTIAL HYSTERECTOMY  1978   POLYPECTOMY  12/23/2019   Procedure: POLYPECTOMY;  Surgeon: Ronnette Juniper, MD;  Location[facility];  Service: Gastroenterology;;   SHOULDER SURGERY Left    Left shoulder for RTC;left arm surgery '98/left hand surgery 2001   TOTAL ABDOMINAL HYSTERECTOMY W/ BILATERAL SALPINGOOPHORECTOMY  2263   UMBILICAL HERNIA REPAIR  2008   VIDEO BRONCHOSCOPY WITH ENDOBRONCHIAL ULTRASOUND N/A 05/20/2020   Procedure: VIDEO BRONCHOSCOPY WITH ENDOBRONCHIAL ULTRASOUND;  Surgeon: Collene Gobble, MD;  Location: [place] [facility];  Service: Pulmonary;  Laterality: N/A;    OB History     Gravida  3   Para  2   Term  2   Preterm      AB  1   Living  2      SAB  1   IAB      Ectopic      Multiple      Live Births               Home Medications    Prior to Admission medications   Medication Sig Start Date End Date Taking? Authorizing Provider  sulfamethoxazole-trimethoprim (BACTRIM DS) 800-160 MG tablet Take 1 tablet by mouth 2 (two) times daily. 04/24/21  Yes Scot Jun, FNP  acetaminophen (TYLENOL) 325 MG tablet Take 2 tablets (650 mg total) by mouth every 6 (six) hours as needed for mild pain (or Fever >/= 101). 03/03/21   Cherene Altes, MD  albuterol (VENTOLIN HFA) 108 (90 Base) MCG/ACT inhaler Inhale 2 puffs into the lungs every 4 (four) hours as needed for wheezing or shortness of breath. 11/17/20   Chesley Mires, MD  Alpha-Lipoic Acid 200 MG CAPS Take 200 mg by mouth daily.    [provider]  aspirin-acetaminophen-caffeine (EXCEDRIN MIGRAINE) [phone] MG tablet Take 2 tablets by mouth daily as needed for headache. Up to 4 times a week    [provider]  b complex vitamins tablet Take 1 tablet by mouth daily. With Zinc and vitamin C    [provider]  Benzocaine (BOIL-EASE EX) Apply 1 application topically daily as needed (Boil).    [provider]  Biotin 5000 MCG TABS Take 10,000 mcg by mouth daily. Keratin    [provider]  carbamide peroxide (DEBROX) 6.5 % OTIC solution Place 5 drops into both ears daily as needed (ears).    [provider]  Cholecalciferol (VITAMIN D3) 50 MCG (2000 UT) TABS Take 8,000 Units by mouth daily.    [provider]  colestipol (COLESTID) 1 g tablet Take 3 tablets (3 g total) by mouth daily. Do not take within two hours of other medications. 12/14/20   Mahala Menghini, PA-C  dexamethasone (DECADRON) 4 MG tablet Take 1 tablet (4 mg total) by mouth every 12 (twelve) hours. 03/08/21   [name], MD  diazepam (VALIUM) 5 MG tablet Take 2.5 mg by mouth at bedtime. 1/2 tab qhs    [provider]  diclofenac Sodium (VOLTAREN) 1 % GEL Apply 2 g topically daily as needed (pain).    [provider]  Fluticasone-Umeclidin-Vilant (TRELEGY ELLIPTA) 100-62.5-25 MCG/INH AEPB Inhale 1 puff into the lungs daily. 05/31/20   Chesley Mires, MD  folic acid (FOLVITE) 1 MG tablet Take 1 mg by mouth daily.    [provider]  furosemide (LASIX) 40  MG tablet Take 1 tablet (40 mg total) by mouth daily. 03/03/21   Cherene Altes, MD  hydrocortisone cream 1 % Apply 1 application topically daily as needed  for itching.    [provider]  Hydrocortisone, Perianal, (PROCTO-PAK) 1 % CREA Apply anorectally twice daily for two weeks. 07/12/20   Mahala Menghini, PA-C  levothyroxine (SYNTHROID) 112 MCG tablet Take 112 mcg by mouth daily. 12/02/20   [provider]  metFORMIN (GLUCOPHAGE) 500 MG tablet Take 1,000 mg by mouth 2 (two) times daily with a meal. Pt has been taking 2tabs BID. 09/18/19   [provider]  metroNIDAZOLE (FLAGYL) 500 MG tablet Take 1 tablet (500 mg total) by mouth 2 (two) times daily. 03/19/21   Triplett, Tammy, PA-C  Multiple Vitamins-Minerals (MULTIVITAMIN WITH MINERALS) tablet Take 1 tablet by mouth daily. Adult 50+    [provider]  neomycin-bacitracin-polymyxin (NEOSPORIN) OINT Apply 1 application topically daily as needed for irritation or wound care.    [provider]  omega-3 acid ethyl esters (LOVAZA) 1 g capsule Take 1 g by mouth 2 (two) times daily.    [provider]  oxyCODONE-acetaminophen (PERCOCET/ROXICET) 5-325 MG tablet Take 0.5 tablets by mouth 2 (two) times daily as needed for severe pain. 05/20/20   Collene Gobble, MD  pantoprazole (PROTONIX) 40 MG tablet Take 40 mg by mouth daily. 09/29/19   [provider]  PARoxetine (PAXIL) 20 MG tablet Take 20 mg by mouth daily. 01/12/21   [provider]  predniSONE (DELTASONE) 50 MG tablet Take one tablet 13 hours, one tablet 7 hours and 1 tablet 1 hour prior to CT scan. 03/15/21   Curt Bears, MD  Probiotic Product (PROBIOTIC PO) Take 1 capsule by mouth daily.    [provider]  rosuvastatin (CRESTOR) 20 MG tablet Take 20 mg by mouth daily as needed. 01/12/21   [provider]  traZODone (DESYREL) 150 MG tablet Take 75 mg by mouth at bedtime. 08/19/15   [provider]     Family History Family History  Problem Relation Age of Onset   Depression Mother    Bipolar disorder Mother    Dementia Mother    Pulmonary fibrosis Mother    Alcohol abuse Father    Aneurysm Father        deceased age 52, brain   Colon cancer Paternal Grandfather        age greater than 98    Social History Social History   Tobacco Use   Smoking status: Former    Packs/day: 0.50    Years: 40.00    Pack years: 20.00    Types: Cigarettes    Quit date: 08/30/2017    Years since quitting: 3.6   Smokeless tobacco: Never  Vaping Use   Vaping Use: Never used  Substance Use Topics   Alcohol use: No   Drug use: No     Allergies   Iohexol, Tape, Betadine [povidone iodine], Ciprofloxacin, Latex, Penicillins, and Povidone-iodine   Review of Systems Review of Systems Pertinent negatives listed in HPI   Physical Exam Triage Vital Signs ED Triage Vitals  Enc Vitals Group     BP 04/24/21 1437 112/64     Pulse Rate 04/24/21 1437 91     Resp 04/24/21 1437 16     Temp 04/24/21 1437 98.5 F (36.9 C)     Temp Source 04/24/21 1437 Oral     SpO2 04/24/21 1437 92 %     Weight --      Height --      Head Circumference --      Peak Flow --  Pain Score 04/24/21 1440 7     Pain Loc --      Pain Edu? --      Excl. in [place]? --    No data found.  Updated Vital Signs BP 112/64 (BP Location: Right Arm)   Pulse 91   Temp 98.5 F (36.9 C) (Oral)   Resp 16   SpO2 92%   Visual Acuity Right Eye Distance:   Left Eye Distance:   Bilateral Distance:    Right Eye Near:   Left Eye Near:    Bilateral Near:     Physical Exam General appearance: alert, well developed, well nourished, cooperative and in no distress Head: Normocephalic, without obvious abnormality, atraumatic Respiratory: Respirations even and unlabored, normal respiratory rate Heart:Rate and rhythm normal. No gallop or murmurs noted on exam  Extremities: No gross deformities Skin: Puncture and  abrasive wound (multiple) left hand trace swelling , tenderness present, no drainage  Psych: Appropriate mood and affect. Neurologic: Mental status: Alert, oriented to person, place, and time, thought content appropriate.   UC Treatments / Results  Labs (all labs ordered are listed, but only abnormal results are displayed) Labs Reviewed - No data to display  EKG   Radiology No results found.  Procedures Procedures (including critical care time)  Medications Ordered in UC Medications - No data to display  Initial Impression / Assessment and Plan / UC Course  I have reviewed the triage vital signs and the nursing notes.  Pertinent labs & imaging results that were available during my care of the patient were reviewed by me and considered in my medical decision making (see chart for details).     TDAP up to date. Treatment of infection with Bactrim ER precaution given if wounds worsen or fail to heal with treatment. Final Clinical Impressions(s) / UC Diagnoses   Final diagnoses:  Dog bite, initial encounter, left hand  Infected puncture wound of hand, left, initial encounter   Discharge Instructions   None    ED Prescriptions     Medication Sig Dispense Auth. Provider   sulfamethoxazole-trimethoprim (BACTRIM DS) 800-160 MG tablet Take 1 tablet by mouth 2 (two) times daily. 20 tablet Scot Jun, FNP      PDMP not reviewed this encounter.   Scot Jun, [place] 04/28/21 [phone]

## 2021-05-03 ENCOUNTER — Other Ambulatory Visit: Payer: Self-pay

## 2021-05-03 ENCOUNTER — Emergency Department (HOSPITAL_COMMUNITY)
Admission: EM | Admit: 2021-05-03 | Discharge: 2021-05-03 | Disposition: A | Discharge status: Home / Self Care | Payer: Medicare HMO | Attending: Emergency Medicine | Admitting: Emergency Medicine

## 2021-05-03 ENCOUNTER — Encounter: Payer: Self-pay | Admitting: Radiology

## 2021-05-03 DIAGNOSIS — I130 Hypertensive heart and chronic kidney disease with heart failure and stage 1 through stage 4 chronic kidney disease, or unspecified chronic kidney disease: Secondary | ICD-10-CM | POA: Insufficient documentation

## 2021-05-03 DIAGNOSIS — J449 Chronic obstructive pulmonary disease, unspecified: Secondary | ICD-10-CM | POA: Insufficient documentation

## 2021-05-03 DIAGNOSIS — E1122 Type 2 diabetes mellitus with diabetic chronic kidney disease: Secondary | ICD-10-CM | POA: Diagnosis not present

## 2021-05-03 DIAGNOSIS — E1129 Type 2 diabetes mellitus with other diabetic kidney complication: Secondary | ICD-10-CM | POA: Diagnosis not present

## 2021-05-03 DIAGNOSIS — Z91040 Latex allergy status: Secondary | ICD-10-CM | POA: Insufficient documentation

## 2021-05-03 DIAGNOSIS — Z923 Personal history of irradiation: Secondary | ICD-10-CM | POA: Diagnosis not present

## 2021-05-03 DIAGNOSIS — R5383 Other fatigue: Secondary | ICD-10-CM | POA: Diagnosis not present

## 2021-05-03 DIAGNOSIS — E1169 Type 2 diabetes mellitus with other specified complication: Secondary | ICD-10-CM | POA: Diagnosis not present

## 2021-05-03 DIAGNOSIS — Z85118 Personal history of other malignant neoplasm of bronchus and lung: Secondary | ICD-10-CM | POA: Diagnosis not present

## 2021-05-03 DIAGNOSIS — E1142 Type 2 diabetes mellitus with diabetic polyneuropathy: Secondary | ICD-10-CM | POA: Insufficient documentation

## 2021-05-03 DIAGNOSIS — Z9221 Personal history of antineoplastic chemotherapy: Secondary | ICD-10-CM | POA: Insufficient documentation

## 2021-05-03 DIAGNOSIS — E039 Hypothyroidism, unspecified: Secondary | ICD-10-CM | POA: Diagnosis not present

## 2021-05-03 DIAGNOSIS — I5033 Acute on chronic diastolic (congestive) heart failure: Secondary | ICD-10-CM | POA: Insufficient documentation

## 2021-05-03 DIAGNOSIS — E782 Mixed hyperlipidemia: Secondary | ICD-10-CM | POA: Diagnosis not present

## 2021-05-03 DIAGNOSIS — E1151 Type 2 diabetes mellitus with diabetic peripheral angiopathy without gangrene: Secondary | ICD-10-CM | POA: Insufficient documentation

## 2021-05-03 DIAGNOSIS — Z7984 Long term (current) use of oral hypoglycemic drugs: Secondary | ICD-10-CM | POA: Insufficient documentation

## 2021-05-03 DIAGNOSIS — Z7951 Long term (current) use of inhaled steroids: Secondary | ICD-10-CM | POA: Insufficient documentation

## 2021-05-03 DIAGNOSIS — N183 Chronic kidney disease, stage 3 unspecified: Secondary | ICD-10-CM | POA: Insufficient documentation

## 2021-05-03 DIAGNOSIS — E1136 Type 2 diabetes mellitus with diabetic cataract: Secondary | ICD-10-CM | POA: Diagnosis not present

## 2021-05-03 DIAGNOSIS — Z87891 Personal history of nicotine dependence: Secondary | ICD-10-CM | POA: Diagnosis not present

## 2021-05-03 DIAGNOSIS — Z85841 Personal history of malignant neoplasm of brain: Secondary | ICD-10-CM | POA: Diagnosis not present

## 2021-05-03 DIAGNOSIS — Z79899 Other long term (current) drug therapy: Secondary | ICD-10-CM | POA: Diagnosis not present

## 2021-05-03 NOTE — ED Provider Notes (Signed)
Emergency Medicine Provider Triage Evaluation Note  [name] , a 70 y.o. female  was evaluated in triage.  Pt complains of oxygen level and blood pressure low at home.  Review of Systems  Positive:  Negative: No fever  Physical Exam  BP 111/85 (BP Location: Left Arm)   Pulse 88   Temp 98.6 F (37 C) (Oral)   Resp 20   Ht 5' 6.75" (1.695 m)   Wt 59.9 kg   SpO2 93%   BMI 20.83 kg/m  Gen:   Awake, no distress   Resp:  Normal effort  MSK:   Moves extremities without difficulty  Other:    Medical Decision Making  Medically screening exam initiated at 1:40 PM.  Appropriate orders placed.  HARMONEY SIENKIEWICZ was informed that the remainder of the evaluation will be completed by another provider, this initial triage assessment does not replace that evaluation, and the importance of remaining in the ED until their evaluation is complete.     Fransico Meadow, [place] 05/03/21 1341    Daleen Bo, MD 05/04/21 [phone]

## 2021-05-03 NOTE — ED Provider Notes (Signed)
[facility] EMERGENCY DEPARTMENT Provider Note   CSN: [id number] Arrival date & time: 05/03/21  1304     History Chief Complaint  Patient presents with   Dizziness    [name] is a 70 y.o. female.  Pt presents to the ED today with low oxygen.  Pt said she was doing a routine check on her oxygen level at home.  The level was 65%.  She called her doctor who told her to come to the ED.  Pt said she was feeling her normal.  When she arrived here, it was 94%.  She waited nearly 3 hrs and her O2 sat is still in the 90s.  Unfortunately, no blood work or CXR was ordered while she was waiting.  She wants to go home now and does not want any tests.  She said she feels her normal.      Past Medical History:  Diagnosis Date   Acute on chronic diastolic CHF (congestive heart failure) ([place]) 02/28/2021   Allergic rhinitis    Amputation of hand, right (HCC)    traumatic   Anemia    Anxiety    ASCVD (arteriosclerotic cardiovascular disease)    MI in 96 requiring BMS CX; DES to M1 in 2000;normal coronary angiography in 2004   Cholelithiasis    COPD (chronic obstructive pulmonary disease) (HCC)    DDD (degenerative disc disease), lumbar    Depression    Diabetes mellitus    Type II   Diarrhea    DVT (deep venous thrombosis) ([place])    patient said no   GERD (gastroesophageal reflux disease)    Headache    migraine   History of kidney stones    2006   History of radiation therapy 06/28/2020-08/08/2020   right Lung; Dr. 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/27/2021   Brain mass 02/28/2021   Acute on chronic diastolic CHF (congestive heart failure) ([place]) 02/28/2021   Brain metastases ([place])    Small cell lung cancer, right middle lobe ([place]) 05/30/2020   Encounter for antineoplastic chemotherapy 05/30/2020   Goals of care, counseling/discussion 05/30/2020   Intolerance to cold 05/23/2020   Mediastinal adenopathy 05/20/2020   Abnormal weight loss 05/17/2020   IDA (iron deficiency anemia) 02/15/2020   Hepatic cirrhosis ([place]) 02/15/2020   CKD (chronic kidney disease), stage IIIa 01/09/2020   Lung mass 01/09/2020   Orthostatic hypotension 12/31/2019   Non-ST elevation (NSTEMI) myocardial infarction [facility])    Anemia    Dyspnea    Hx of heart artery stent    Benign hypertension with CKD (chronic kidney disease) stage III ([place])    Former smoker    Chest pain 12/20/2019   Weakness 09/04/2014   Dizziness 09/04/2014   Hepatomegaly 03/31/2014   Esophageal dysphagia 03/31/2014   Unspecified constipation 03/31/2014  AKI (acute kidney injury) ([place]) 10/30/2013   Altered mental status 09/28/2013   ETOH abuse 09/28/2013   Altered mental state 09/28/2013   Hyperkalemia 06/05/2013   Bradycardia 06/05/2013   Edema 06/02/2013   Hyponatremia 06/02/2013   Pneumonia due to infectious agent 12/26/2012   Insomnia due to mental disorder 09/02/2012   RECTAL PAIN 09/04/2010   CHEST PAIN 08/22/2010   Insulin dependent type 2 diabetes mellitus ([place]) 06/16/2010   Coronary artery disease/Prior Stents 1997 and 2000/RCA occlusion 12/2019 06/16/2010   MICROALBUMINURIA 06/01/2009   ACTINIC KERATOSIS, HEAD 04/20/2009   OTHER DYSPHAGIA 01/05/2009   COLONIC POLYPS, ADENOMATOUS, HX OF 01/05/2009   SCHATZKI'S RING, HX OF 01/05/2009   BACK PAIN 08/04/2008   DIARRHEA, CHRONIC 08/04/2008   TOBACCO ABUSE 06/03/2007   IBS 06/03/2007   NEPHROLITHIASIS 02/04/2007    ANXIETY STATE NOS 11/26/2006   Hypothyroidism 11/01/2006   Mixed hyperlipidemia 11/01/2006   Depression 11/01/2006   PERIPHERAL NEUROPATHY 11/01/2006   CATARACT NOS 11/01/2006   ALLERGIC RHINITIS 11/01/2006   COPD (chronic obstructive pulmonary disease) ([place]) 11/01/2006   GERD 11/01/2006   DEGENERATION, DISC NOS 11/01/2006   LOW BACK PAIN 11/01/2006   DVT, HX OF 11/01/2006    Past Surgical History:  Procedure Laterality Date   AGILE CAPSULE N/A 03/07/2020   Procedure: AGILE CAPSULE;  Surgeon: Daneil Dolin, MD;  [facility];  Service: Endoscopy;  Laterality: N/A;  7:30am   BACK SURGERY     fusion   BRONCHIAL NEEDLE ASPIRATION BIOPSY N/A 05/20/2020   Procedure: BRONCHIAL NEEDLE ASPIRATION BIOPSIES;  Surgeon: Collene Gobble, MD;  Location: [place] [facility]COPY;  Service: Pulmonary;  Laterality: N/A;   CHOLECYSTECTOMY     COLONOSCOPY  01/2009   XLK:[id number] rectum/repeat in 5 yrs   COLONOSCOPY N/A 04/29/2014   Dr.Rourk- attempted/incomplete colonoscopy. inadequate prep   COLONOSCOPY N/A 05/27/2014   VOZ:[id number] coli. Colonic polyps-removed as described above.Status post segmental biopsy. single tubular adenoma and random colon bx neg   COLONOSCOPY WITH PROPOFOL N/A 12/23/2019   Procedure: COLONOSCOPY WITH PROPOFOL;  Surgeon: Ronnette Juniper, MD;  Location: [place];  Service: Gastroenterology;  Laterality: N/A;   DILATION AND CURETTAGE OF UTERUS  1974   ESOPHAGOGASTRODUODENOSCOPY  05/2010   Dr. Tessie Fass, erosion. 58F dilation   ESOPHAGOGASTRODUODENOSCOPY N/A 04/29/2014   Dr.Rourk- normal esophagus s/p passage of maloney dilator. small hiatal hernia- bx= chronic inflammation.   ESOPHAGOGASTRODUODENOSCOPY (EGD) WITH PROPOFOL N/A 12/23/2019   Procedure: ESOPHAGOGASTRODUODENOSCOPY (EGD) WITH PROPOFOL;  Surgeon: Ronnette Juniper, MD;  Location: [place];  Service: Gastroenterology;  Laterality: N/A;   GIVENS CAPSULE STUDY N/A 03/22/2020   Procedure: GIVENS CAPSULE STUDY;  Surgeon: Daneil Dolin, MD;  [facility];  Service: Endoscopy;  Laterality: N/A;  7:30am   HEMOSTASIS CLIP PLACEMENT  12/23/2019   Procedure: HEMOSTASIS CLIP PLACEMENT;  Surgeon: Ronnette Juniper, MD;  Location: [place];  Service: Gastroenterology;;   HOT HEMOSTASIS N/A 12/23/2019   Procedure: HOT HEMOSTASIS (ARGON PLASMA COAGULATION/BICAP);  Surgeon: Ronnette Juniper, MD;  Location: [place];  Service: Gastroenterology;  Laterality: N/A;   LEFT HEART CATH AND CORONARY ANGIOGRAPHY N/A 12/24/2019   Procedure: LEFT HEART CATH AND CORONARY ANGIOGRAPHY;  Surgeon: Nigel Mormon, MD;  Location: [place] CV LAB;  Service: Cardiovascular;  Laterality: N/A;   MALONEY DILATION N/A 04/29/2014   Procedure: Venia Minks DILATION;  Surgeon: Daneil Dolin, MD;  [facility];  Service: Endoscopy;  Laterality: N/A;   PARTIAL HYSTERECTOMY  1978  POLYPECTOMY  12/23/2019   Procedure: POLYPECTOMY;  Surgeon: Ronnette Juniper, MD;  Location: [facility]COPY;  Service: Gastroenterology;;   SHOULDER SURGERY Left    Left shoulder for RTC;left arm surgery '98/left hand surgery 2001   TOTAL ABDOMINAL HYSTERECTOMY W/ BILATERAL SALPINGOOPHORECTOMY  3419   UMBILICAL HERNIA REPAIR  2008   VIDEO BRONCHOSCOPY WITH ENDOBRONCHIAL ULTRASOUND N/A 05/20/2020   Procedure: VIDEO BRONCHOSCOPY WITH ENDOBRONCHIAL ULTRASOUND;  Surgeon: Collene Gobble, MD;  Location: [place] ENDOSCOPY;  Service: Pulmonary;  Laterality: N/A;     OB History     Gravida  3   Para  2   Term  2   Preterm      AB  1   Living  2      SAB  1   IAB      Ectopic      Multiple      Live Births              Family History  Problem Relation Age of Onset   Depression Mother    Bipolar disorder Mother    Dementia Mother    Pulmonary fibrosis Mother    Alcohol abuse Father    Aneurysm Father        deceased age 31, brain   Colon cancer Paternal Grandfather        age greater than 78    Social History   Tobacco Use   Smoking status:  Former    Packs/day: 0.50    Years: 40.00    Pack years: 20.00    Types: Cigarettes    Quit date: 08/30/2017    Years since quitting: 3.6   Smokeless tobacco: Never  Vaping Use   Vaping Use: Never used  Substance Use Topics   Alcohol use: No   Drug use: No    Home Medications Prior to Admission medications   Medication Sig Start Date End Date Taking? Authorizing Provider  albuterol (VENTOLIN HFA) 108 (90 Base) MCG/ACT inhaler Inhale 2 puffs into the lungs every 4 (four) hours as needed for wheezing or shortness of breath. 11/17/20  Yes Chesley Mires, MD  Alpha-Lipoic Acid 200 MG CAPS Take 200 mg by mouth daily.   Yes [provider]  aspirin-acetaminophen-caffeine (EXCEDRIN MIGRAINE) [phone] MG tablet Take 2 tablets by mouth daily as needed for headache. Up to 4 times a week   Yes [provider]  b complex vitamins tablet Take 1 tablet by mouth daily. With Zinc and vitamin C   Yes [provider]  Cholecalciferol (VITAMIN D3) 50 MCG (2000 UT) TABS Take 8,000 Units by mouth daily.   Yes [provider]  colestipol (COLESTID) 1 g tablet Take 3 tablets (3 g total) by mouth daily. Do not take within two hours of other medications. 12/14/20  Yes Mahala Menghini, PA-C  diazepam (VALIUM) 5 MG tablet Take 2.5 mg by mouth at bedtime. 1/2 tab qhs   Yes [provider]  acetaminophen (TYLENOL) 325 MG tablet Take 2 tablets (650 mg total) by mouth every 6 (six) hours as needed for mild pain (or Fever >/= 101). Patient not taking: Reported on 05/03/2021 03/03/21   Cherene Altes, MD  Benzocaine (BOIL-EASE EX) Apply 1 application topically daily as needed (Boil). Patient not taking: Reported on 05/03/2021    [provider]  Biotin 5000 MCG TABS Take 10,000 mcg by mouth daily. Keratin Patient not taking: Reported on 05/03/2021    [provider]  carbamide  peroxide (DEBROX) 6.5 % OTIC solution Place 5 drops into both ears daily as needed  (ears). Patient not taking: Reported on 05/03/2021    [provider]  dexamethasone (DECADRON) 4 MG tablet Take 1 tablet (4 mg total) by mouth every 12 (twelve) hours. Patient not taking: Reported on 05/03/2021 03/08/21   [name], MD  diclofenac Sodium (VOLTAREN) 1 % GEL Apply 2 g topically daily as needed (pain). Patient not taking: Reported on 05/03/2021    [provider]  fluticasone (CUTIVATE) 0.05 % cream Apply 1 application topically daily as needed (itch). 03/29/21   [provider]  Fluticasone-Umeclidin-Vilant (TRELEGY ELLIPTA) 100-62.5-25 MCG/INH AEPB Inhale 1 puff into the lungs daily. 05/31/20   Chesley Mires, MD  folic acid (FOLVITE) 1 MG tablet Take 1 mg by mouth daily.    [provider]  furosemide (LASIX) 40 MG tablet Take 1 tablet (40 mg total) by mouth daily. 03/03/21   Cherene Altes, MD  hydrocortisone cream 1 % Apply 1 application topically daily as needed for itching.    [provider]  Hydrocortisone, Perianal, (PROCTO-PAK) 1 % CREA Apply anorectally twice daily for two weeks. 07/12/20   Mahala Menghini, PA-C  levothyroxine (SYNTHROID) 112 MCG tablet Take 112 mcg by mouth daily. 12/02/20   [provider]  metFORMIN (GLUCOPHAGE) 500 MG tablet Take 1,000 mg by mouth 2 (two) times daily with a meal. Pt has been taking 2tabs BID. 09/18/19   [provider]  metFORMIN (GLUCOPHAGE-XR) 500 MG 24 hr tablet Take 1,000 mg by mouth 2 (two) times daily. 02/20/21   [provider]  metroNIDAZOLE (FLAGYL) 500 MG tablet Take 1 tablet (500 mg total) by mouth 2 (two) times daily. 03/19/21   Triplett, Tammy, PA-C  Multiple Vitamins-Minerals (MULTIVITAMIN WITH MINERALS) tablet Take 1 tablet by mouth daily. Adult 50+    [provider]  neomycin-bacitracin-polymyxin (NEOSPORIN) OINT Apply 1 application topically daily as needed for irritation or wound care.    [provider]  omega-3 acid ethyl esters  (LOVAZA) 1 g capsule Take 1 g by mouth 2 (two) times daily.    [provider]  oxyCODONE-acetaminophen (PERCOCET/ROXICET) 5-325 MG tablet Take 0.5 tablets by mouth 2 (two) times daily as needed for severe pain. 05/20/20   Collene Gobble, MD  pantoprazole (PROTONIX) 40 MG tablet Take 40 mg by mouth daily. 09/29/19   [provider]  PARoxetine (PAXIL) 20 MG tablet Take 20 mg by mouth daily. 01/12/21   [provider]  predniSONE (DELTASONE) 50 MG tablet Take one tablet 13 hours, one tablet 7 hours and 1 tablet 1 hour prior to CT scan. 03/15/21   Curt Bears, MD  Probiotic Product (PROBIOTIC PO) Take 1 capsule by mouth daily.    [provider]  rosuvastatin (CRESTOR) 20 MG tablet Take 20 mg by mouth daily as needed. 01/12/21   [provider]  sulfamethoxazole-trimethoprim (BACTRIM DS) 800-160 MG tablet Take 1 tablet by mouth 2 (two) times daily. 04/24/21   Scot Jun, FNP  traZODone (DESYREL) 150 MG tablet Take 75 mg by mouth at bedtime. 08/19/15   [provider]    Allergies    Iohexol, Tape, Betadine [povidone iodine], Ciprofloxacin, Latex, Penicillins, and Povidone-iodine  Review of Systems   Review of Systems  Constitutional:  Positive for fatigue.  All other systems reviewed and are negative.  Physical Exam Updated Vital Signs BP 132/61 (BP Location: Right Arm)   Pulse 88  Temp 98.6 F (37 C) (Oral)   Resp 18   Ht 5' 6.75" (1.695 m)   Wt 59.9 kg   SpO2 94%   BMI 20.83 kg/m   Physical Exam Vitals and nursing note reviewed.  Constitutional:      Appearance: Normal appearance.  HENT:     Head: Normocephalic and atraumatic.     Right Ear: External ear normal.     Left Ear: External ear normal.     Nose: Nose normal.     Mouth/Throat:     Mouth: Mucous membranes are moist.     Pharynx: Oropharynx is clear.  Eyes:     Extraocular Movements: Extraocular movements intact.     Conjunctiva/sclera: Conjunctivae  normal.     Pupils: Pupils are equal, round, and reactive to light.  Cardiovascular:     Rate and Rhythm: Normal rate and regular rhythm.     Pulses: Normal pulses.     Heart sounds: Normal heart sounds.  Pulmonary:     Breath sounds: Wheezing present.  Abdominal:     General: Abdomen is flat. Bowel sounds are normal.     Palpations: Abdomen is soft.  Musculoskeletal:        General: Normal range of motion.     Cervical back: Normal range of motion and neck supple.  Skin:    General: Skin is warm.     Capillary Refill: Capillary refill takes less than 2 seconds.  Neurological:     General: No focal deficit present.     Mental Status: She is alert and oriented to person, place, and time.  Psychiatric:        Mood and Affect: Mood normal.        Behavior: Behavior normal.        Thought Content: Thought content normal.        Judgment: Judgment normal.    ED Results / Procedures / Treatments   Labs (all labs ordered are listed, but only abnormal results are displayed) Labs Reviewed - No data to display  EKG None  Radiology No results found.  Procedures Procedures   Medications Ordered in ED Medications - No data to display  ED Course  I have reviewed the triage vital signs and the nursing notes.  Pertinent labs & imaging results that were available during my care of the patient were reviewed by me and considered in my medical decision making (see chart for details).    MDM Rules/Calculators/A&P                           Vitals here are normal.  She is refusing any blood work or CXR.  She wants to go home.  She said she will come back if she develops sob.  Pt is awake and alert and able to make this decision.  She knows to return if anything worsens.  F/u with pcp.  Final Clinical Impression(s) / ED Diagnoses Final diagnoses:  Fatigue, unspecified type    Rx / DC Orders ED Discharge Orders     None        Isla Pence, MD 05/03/21 1555

## 2021-05-03 NOTE — ED Triage Notes (Signed)
Pt states her O2 level was as low as 65% today; pt spoke to nurse on phone who told her to come to ED

## 2021-05-03 NOTE — ED Notes (Signed)
Assumed care of pt at 1500

## 2021-05-04 ENCOUNTER — Ambulatory Visit (HOSPITAL_COMMUNITY)
Admission: RE | Admit: 2021-05-04 | Discharge: 2021-05-04 | Disposition: A | Discharge status: Home / Self Care | Payer: Medicare HMO | Source: Ambulatory Visit | Attending: Radiation Oncology | Admitting: Radiation Oncology

## 2021-05-04 DIAGNOSIS — C3490 Malignant neoplasm of unspecified part of unspecified bronchus or lung: Secondary | ICD-10-CM | POA: Insufficient documentation

## 2021-05-04 DIAGNOSIS — G9389 Other specified disorders of brain: Secondary | ICD-10-CM | POA: Diagnosis not present

## 2021-05-04 MED ORDER — GADOBUTROL 1 MMOL/ML IV SOLN
6.0000 mL | Freq: Once | INTRAVENOUS | Status: AC | PRN
  Administered 2021-05-04: 6 mL via INTRAVENOUS

## 2021-05-05 ENCOUNTER — Other Ambulatory Visit: Payer: Self-pay

## 2021-05-05 ENCOUNTER — Ambulatory Visit: Payer: Medicare HMO | Admitting: Cardiology

## 2021-05-05 ENCOUNTER — Encounter: Payer: Self-pay | Admitting: Cardiology

## 2021-05-05 VITALS — BP 123/50 | HR 93 | Temp 98.0°F | Resp 16 | Ht 66.0 in | Wt 135.0 lb

## 2021-05-05 DIAGNOSIS — I2510 Atherosclerotic heart disease of native coronary artery without angina pectoris: Secondary | ICD-10-CM | POA: Diagnosis not present

## 2021-05-05 NOTE — Progress Notes (Signed)
Follow up visit  Subjective:   [name], female    DOB: 1950/10/28, 70 y.o.   MRN: [id number]   HPI   Chief Complaint  Patient presents with   Coronary artery disease of native artery of native heart wi   Hypertension   Follow-up    3 month     70 y.o. 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  Patient recently underwent radiation for her solitary brain metastasis.  She continues to have dizziness symptoms.  From cardiac standpoint, she denies any angina, dyspnea symptoms.  Current Outpatient Medications on File Prior to Visit  Medication Sig Dispense Refill   acetaminophen (TYLENOL) 325 MG tablet Take 2 tablets (650 mg total) by mouth every 6 (six) hours as needed for mild pain (or Fever >/= 101). (Patient not taking: Reported on 05/03/2021)     albuterol (VENTOLIN HFA) 108 (90 Base) MCG/ACT inhaler Inhale 2 puffs into the lungs every 4 (four) hours as needed for wheezing or shortness of breath. 1 each 3   Alpha-Lipoic Acid 200 MG CAPS Take 200 mg by mouth daily.     aspirin-acetaminophen-caffeine (EXCEDRIN MIGRAINE) 250-250-65 MG tablet Take 2 tablets by mouth daily as needed for headache. Up to 4 times a week     b complex vitamins tablet Take 1 tablet by mouth daily. With Zinc and vitamin C     Benzocaine (BOIL-EASE EX) Apply 1 application topically daily as needed (Boil). (Patient not taking: Reported on 05/03/2021)     Biotin 5000 MCG TABS Take 10,000 mcg by mouth daily. Keratin (Patient not taking: Reported on 05/03/2021)     carbamide peroxide (DEBROX) 6.5 % OTIC solution Place 5 drops into both ears daily as needed (ears). (Patient not taking: Reported on 05/03/2021)     Cholecalciferol (VITAMIN D3) 50  MCG (2000 UT) TABS Take 8,000 Units by mouth daily.     colestipol (COLESTID) 1 g tablet Take 3 tablets (3 g total) by mouth daily. Do not take within two hours of other medications. 90 tablet 5   dexamethasone (DECADRON) 4 MG tablet Take 1 tablet (4 mg total) by mouth every 12 (twelve) hours. (Patient not taking: Reported on 05/03/2021) 28 tablet 0   diazepam (VALIUM) 5 MG tablet Take 2.5 mg by mouth at bedtime. 1/2 tab qhs     diclofenac Sodium (VOLTAREN) 1 % GEL Apply 2 g topically daily as needed (pain). (Patient not taking: Reported on 05/03/2021)     fluticasone (CUTIVATE) 0.05 % cream Apply 1 application topically daily as needed (itch).     Fluticasone-Umeclidin-Vilant (TRELEGY ELLIPTA) 100-62.5-25 MCG/INH AEPB Inhale 1 puff into the lungs daily. 28 each 5   folic acid (FOLVITE) 1 MG tablet Take 1 mg by mouth daily.     furosemide (LASIX) 40 MG tablet Take 1 tablet (40 mg total) by mouth daily. 30 tablet 1   hydrocortisone cream 1 % Apply 1 application topically daily as needed for itching.     Hydrocortisone, Perianal, (PROCTO-PAK) 1 % CREA Apply anorectally twice daily for two weeks. 28 g 0   levothyroxine (SYNTHROID) 112 MCG tablet Take 112 mcg by mouth daily.     metFORMIN (GLUCOPHAGE) 500 MG tablet Take 1,000 mg by mouth 2 (two) times daily with a meal. Pt  has been taking 2tabs BID.     metFORMIN (GLUCOPHAGE-XR) 500 MG 24 hr tablet Take 1,000 mg by mouth 2 (two) times daily.     metroNIDAZOLE (FLAGYL) 500 MG tablet Take 1 tablet (500 mg total) by mouth 2 (two) times daily. 14 tablet 0   Multiple Vitamins-Minerals (MULTIVITAMIN WITH MINERALS) tablet Take 1 tablet by mouth daily. Adult 50+     neomycin-bacitracin-polymyxin (NEOSPORIN) OINT Apply 1 application topically daily as needed for irritation or wound care.     omega-3 acid ethyl esters (LOVAZA) 1 g capsule Take 1 g by mouth 2 (two) times daily.     oxyCODONE-acetaminophen (PERCOCET/ROXICET) 5-325 MG tablet Take 0.5 tablets by mouth  2 (two) times daily as needed for severe pain.     pantoprazole (PROTONIX) 40 MG tablet Take 40 mg by mouth daily.     PARoxetine (PAXIL) 20 MG tablet Take 20 mg by mouth daily.     predniSONE (DELTASONE) 50 MG tablet Take one tablet 13 hours, one tablet 7 hours and 1 tablet 1 hour prior to CT scan. 3 tablet 3   Probiotic Product (PROBIOTIC PO) Take 1 capsule by mouth daily.     rosuvastatin (CRESTOR) 20 MG tablet Take 20 mg by mouth daily as needed.     sulfamethoxazole-trimethoprim (BACTRIM DS) 800-160 MG tablet Take 1 tablet by mouth 2 (two) times daily. 20 tablet 0   traZODone (DESYREL) 150 MG tablet Take 75 mg by mouth at bedtime.     No current facility-administered medications on file prior to visit.    Cardiovascular & other pertient studies:  EKG 11/23/2020: Sinus rhythm 67 bpm Old anteroseptal infarct Poor R-wave progression Nonspecific T-abnormality  MRI brain 09/21/2020: The previously identified cerebellar lesion is no longer seen. No new abnormality or visible metastatic disease today.  DVT US 07/2020: Left:  No evidence of deep vein thrombosis in the upper extremity. 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      Coronary angiography 12/24/2019: LM: Normal LAD: Minimal luminal irregularities LCx: Prox LCx 30%, prox 50% OM1 stenosis      RCA: Prox RCA CTO with right-to right bridging collaterals, and left-to-right collaterals   LVEDP 28 mmHg. Hb 7.9 g/dl.    Echocardiogram 12/21/2019: 1. Left ventricular ejection fraction, by estimation, is 55 to 60%. The  left ventricle has normal function. The left ventricle has no regional  wall motion abnormalities. Left ventricular diastolic parameters are  consistent with Grade II diastolic  dysfunction (pseudonormalization).   2. Right ventricular systolic function is normal. The right ventricular  size is normal.   3. Left atrial size was mildly  dilated.   4. The mitral valve is grossly normal. Mild to moderate mitral valve  regurgitation.   5. Trileaflet aortic valve with mild calcification. Mild aortic stenosis.  Mean PG 9 mmHg, Vmax 2.0 m/sec, AVA 2.1 cm2.   6. Estimated RA pressure 8 mmHg.   Recent labs: 03/20/2021: Glucose 320, BUN/Cr 30/1.47. EGFR 38. Na/K 134/4.9. ALT 57. Rest of the CMP normal H/H 12/37. MCV 95. Platelets 107 HbA1C 6.5%  09/05/2020: Glucose 110, BUN/Cr 15/1.18. EGFR 50. Na/K 139/4.4. Rest of the CMP normal H/H 8/24. MCV 109. Platelets 82  05/20/2020: Glucose 97, BUN/Cr 28/1.36. EGFR 40. Na/K 137/4.8. Albumin 3.2. Rest of the CMP normal H/H 9.8/31.1. MCV 95.7. Platelets 243 Chol 120, TG 90, HDL 41, LDL 62   01/11/2020: Glucose 210, BUN/Cr 21/1.44. EGFR 37.  Na/K 138/4.4.  H/H 10.4/34.4. MCV 91. Platelets 165  12/29/2019: Glucose 124, BUN/Cr 29/1.67. EGFR 31. Na/K 140/4.8.  H/H 12/38. MCV 87. Platelets 290  12/23/2019: Glucose 75, BUN/Cr 19/1.1. EGFR 52. Na/K 143/3.6. Rest of the CMP normal H/H 7.9/27.1. MCV 88. Platelets 233 HbA1C 8.2%  2015: TSH 3.8normal  2010: Chol 142, TG 189, HDL 36, LDL 68    Review of Systems  Cardiovascular:  Negative for chest pain, dyspnea on exertion, leg swelling, palpitations and syncope.        Vitals:   05/05/21 1032  BP: (!) 123/50  Pulse: 93  Resp: 16  Temp: 98 F (36.7 C)  SpO2: 93%    Body mass index is 21.79 kg/m. Filed Weights   05/05/21 1032  Weight: 135 lb (61.2 kg)     Objective:   Physical Exam Vitals and nursing note reviewed.  Constitutional:      General: She is not in acute distress. Neck:     Vascular: No JVD.  Cardiovascular:     Rate and Rhythm: Normal rate and regular rhythm.     Heart sounds: Normal heart sounds. No murmur heard. Pulmonary:     Effort: Pulmonary effort is normal.     Breath sounds: Normal breath sounds. No wheezing or rales.           Assessment & Recommendations:    70 y.o. Caucasian  female  with CAD (RCA CTO),  controlled hypertension, hyperlipidemia, type 2 diabetes mellitus, h/o GI Bleed,  former smoker, liver cirrhosis, hypothyroidism, COPD/asthma, depression, extensive stage small cell lung cancer with recurrent solitary brain meastasis, s/p palliative chemo and radiotherapy.  CAD: Known RCA CTO, without any ongoing angina symptoms. Overall, she is stable without any anginal symptoms.  She has recently ambulated steadily and given albumin to sepsis.  She has history of GI bleeding.  I think her risks of aspirin outweigh the benefits.  Therefore, not on aspirin. Lipids well controlled.  I will see her on as needed basis.  F/u as needed  Nigel Mormon, MD [facility] [place] Pager: [phone] Office: [phone]

## 2021-05-07 NOTE — Progress Notes (Signed)
Radiation Oncology         [phone] ________________________________  Name: [name] MRN: [id number]  Date: 05/08/2021  DOB: 01-Feb-1951  Follow-Up Visit Note  CC: [name], MD  [name], MD    ICD-10-CM   1. 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 She tolerated her most recent radiation treatment relatively well with her main complaints being of headaches, occasional dizziness, and mild fatigue. The patient was noted near the end of treatment to have erythema and alopecia to the top of her head, as well as a bandage over her left forearm from her dog biting her recently. Of note: on the day of her final treatment she was started on a decadron taper which should last her for approximately 3 weeks.    Recent imaging pertinent to the patients cancer diagnosis is as follows:  - Chest CT on 03/16/21 demonstrating: a slight decrease in size of the posterior right middle lobe mass, and stable ill-defined soft tissue thickening in the right parahilar region without discrete lymphadenopathy. Otherwise, there was no new or progressive disease seen within the thorax.  -MRI of the brain w/ and without contrast on 05/04/21 demonstrating: a decrease in size of the left cerebellar lesion now measuring  9 mm;  previously measuring 24 mm. Overall, no intracranial metastatic disease was seen.  Of note: the patient did present to an urgent care on 03/19/21 for treatment of the dog bite. She later returned to the urgent care on 04/24/21 presenting with concern for infection of the dog bite. The wounds from the visit note were described as multiple superficially abrasive wounds with accompanied puncture wounds. Both the patient and her dog were up to date on all of their vaccines; the patient was treated with bactrim and sent home.      More recently, the patient presented to the [facility] ED on 05/03/21 following a home O2 reading of 65%. The patient accordingly caller her primary care physician who told her to go to the ED. Upon arrival at the ED, the patients O2 saturation measured 94% and stayed in the 90s for the following 3 hours. No blood work or x-rays were performed while the patient was waiting, the patient stated that she wanted to go home without receiving any further testing.                      Allergies:  is allergic to iohexol, tape, betadine [povidone iodine], ciprofloxacin, latex, penicillins, and povidone-iodine.  Meds: Current Outpatient Medications  Medication Sig Dispense Refill   albuterol (VENTOLIN HFA) 108 (90 Base) MCG/ACT inhaler Inhale 2 puffs into the lungs every 4 (four) hours as needed for wheezing or shortness of breath. 1 each 3   Alpha-Lipoic Acid 200  MG CAPS Take 200 mg by mouth daily.     aspirin-acetaminophen-caffeine (EXCEDRIN MIGRAINE) 250-250-65 MG tablet Take 2 tablets by mouth daily as needed for headache. Up to 4 times a week     b complex vitamins tablet Take 1 tablet by mouth daily. With Zinc and vitamin C     Benzocaine (BOIL-EASE EX) Apply 1 application topically daily as needed (Boil).     Biotin 5000 MCG TABS Take 10,000 mcg by mouth daily. Keratin     carbamide peroxide (DEBROX) 6.5 % OTIC solution Place 5 drops into both ears daily as needed (ears).      Cholecalciferol (VITAMIN D3) 50 MCG (2000 UT) TABS Take 8,000 Units by mouth daily.     diazepam (VALIUM) 5 MG tablet Take 2.5 mg by mouth at bedtime. 1/2 tab qhs     fluticasone (CUTIVATE) 0.05 % cream Apply 1 application topically daily as needed (itch).     Fluticasone-Umeclidin-Vilant (TRELEGY ELLIPTA) 100-62.5-25 MCG/INH AEPB Inhale 1 puff into the lungs daily. 28 each 5   folic acid (FOLVITE) 1 MG tablet Take 1 mg by mouth daily.     furosemide (LASIX) 40 MG tablet Take 1 tablet (40 mg total) by mouth daily. 30 tablet 1   hydrocortisone cream 1 % Apply 1 application topically daily as needed for itching.     Hydrocortisone, Perianal, (PROCTO-PAK) 1 % CREA Apply anorectally twice daily for two weeks. 28 g 0   levothyroxine (SYNTHROID) 112 MCG tablet Take 112 mcg by mouth daily.     metFORMIN (GLUCOPHAGE) 500 MG tablet Take 1,000 mg by mouth 2 (two) times daily with a meal. Pt has been taking 2tabs BID.     metFORMIN (GLUCOPHAGE-XR) 500 MG 24 hr tablet Take 1,000 mg by mouth 2 (two) times daily.     metroNIDAZOLE (FLAGYL) 500 MG tablet Take 1 tablet (500 mg total) by mouth 2 (two) times daily. 14 tablet 0   Multiple Vitamins-Minerals (MULTIVITAMIN WITH MINERALS) tablet Take 1 tablet by mouth daily. Adult 50+     neomycin-bacitracin-polymyxin (NEOSPORIN) OINT Apply 1 application topically daily as needed for irritation or wound care.     omega-3 acid ethyl esters (LOVAZA) 1 g capsule Take 1 g by mouth 2 (two) times daily.     oxyCODONE-acetaminophen (PERCOCET/ROXICET) 5-325 MG tablet Take 0.5 tablets by mouth 2 (two) times daily as needed for severe pain.     pantoprazole (PROTONIX) 40 MG tablet Take 40 mg by mouth daily.     PARoxetine (PAXIL) 20 MG tablet Take 20 mg by mouth daily.     Probiotic Product (PROBIOTIC PO) Take 1 capsule by mouth daily.     rosuvastatin (CRESTOR) 20 MG tablet Take 20 mg by mouth daily as needed.     sulfamethoxazole-trimethoprim (BACTRIM DS) 800-160 MG tablet  Take 1 tablet by mouth 2 (two) times daily. 20 tablet 0   traZODone (DESYREL) 150 MG tablet Take 75 mg by mouth at bedtime.     No current facility-administered medications for this encounter.    Physical Findings: The patient is in no acute distress. Patient is alert and oriented.  height is 5' 6.75" (1.695 m) and weight is 136 lb 9.6 oz (62 kg). Her temperature is 97.9 F (36.6 C). Her blood pressure is 113/72 and her pulse is 83. Her respiration is 20 and oxygen saturation is 94%. .  No significant changes. Lungs are clear to auscultation bilaterally. Heart has regular rate and rhythm. No palpable cervical,  supraclavicular, or axillary adenopathy. Abdomen soft, non-tender, normal bowel sounds.  Scalp shows alopecia from her radiation therapy.  Skin is healed well.  Patient is known to have a resting tremor which has been noticed on previous exams.  She is ambulating without difficulty.   Lab Findings: Lab Results  Component Value Date   WBC 9.2 03/20/2021   HGB 12.2 03/20/2021   HCT 37.4 03/20/2021   MCV 95.2 03/20/2021   PLT 107 (L) 03/20/2021    Radiographic Findings: MR Brain W Wo Contrast  Result Date: 05/04/2021 CLINICAL DATA:  Staging of small cell lung carcinoma EXAM: MRI HEAD WITHOUT AND WITH CONTRAST TECHNIQUE: Multiplanar, multiecho pulse sequences of the brain and surrounding structures were obtained without and with intravenous contrast. CONTRAST:  32m GADAVIST GADOBUTROL 1 MMOL/ML IV SOLN COMPARISON:  None. FINDINGS: Brain: No acute infarct, mass effect or extra-axial collection. No acute or chronic hemorrhage. There is multifocal hyperintense T2-weighted signal within the white matter. Parenchymal volume and CSF spaces are normal. The midline structures are normal. Lateral left cerebellar lesion has decreased in size and now measures 9 mm, previously 24 mm (series 16, image 47). No other contrast-enhancing lesions. Vascular: Major flow voids are preserved. Skull and upper  cervical spine: Normal calvarium and skull base. Visualized upper cervical spine and soft tissues are normal. Sinuses/Orbits:No paranasal sinus fluid levels or advanced mucosal thickening. No mastoid or middle ear effusion. Normal orbits. IMPRESSION: 1. No intracranial metastatic disease. 2. Decreased size of left cerebellar lesion, now measuring 9 mm, previously 24 mm. Electronically Signed   By: KUlyses JarredM.D.   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  Recent brain MRI shows good response to her whole brain radiation therapy.  Plan: Routine follow-up in 3 months.  Prior to this follow-up appointment the patient will undergo a MRI of the brain.   ________________________  JBlair Promise PhD, MD   This document serves as a record of services personally performed by JGery Pray MD. It was created on his behalf by ERoney Mans a trained medical scribe. The creation of this record is based on the scribe's personal observations and the provider's statements to them. This document has been checked and approved by the attending provider.

## 2021-05-07 NOTE — Progress Notes (Incomplete)
  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 She reports having headaches, occasional dizziness, and mild fatigue. The patient is noted to have erythema and alopecia to the top her head, as well as a bandage over her left forearm from her fog biting her recently (has not had sutures placed for wound). The patient denies any visual or auditory changes, and her skin remains intact.  Of note: the patient is taking Excedrin migraine for headaches. She was started on a decadron taper this visit which will last her approximately 3 weeks.  Plan: The patient will follow-up with radiation oncology in one month   ________________________________________________ -----------------------------------  Blair Promise, PhD, MD  This document serves as a record of services personally performed by [name], MD. It was created on his behalf by Roney Mans, a trained medical scribe. The creation of this record is based on the scribe's personal observations and the provider's statements to them. This document has been checked and approved by the attending provider.

## 2021-05-08 ENCOUNTER — Ambulatory Visit
Admission: RE | Admit: 2021-05-08 | Discharge: 2021-05-08 | Disposition: A | Discharge status: Home / Self Care | Payer: Medicare HMO | Source: Ambulatory Visit | Attending: Radiation Oncology | Admitting: Radiation Oncology

## 2021-05-08 ENCOUNTER — Other Ambulatory Visit: Payer: Self-pay

## 2021-05-08 ENCOUNTER — Encounter: Payer: Self-pay | Admitting: Radiation Oncology

## 2021-05-08 DIAGNOSIS — Z7984 Long term (current) use of oral hypoglycemic drugs: Secondary | ICD-10-CM | POA: Insufficient documentation

## 2021-05-08 DIAGNOSIS — Z79899 Other long term (current) drug therapy: Secondary | ICD-10-CM | POA: Insufficient documentation

## 2021-05-08 DIAGNOSIS — C342 Malignant neoplasm of middle lobe, bronchus or lung: Secondary | ICD-10-CM | POA: Diagnosis not present

## 2021-05-08 DIAGNOSIS — Z923 Personal history of irradiation: Secondary | ICD-10-CM | POA: Insufficient documentation

## 2021-05-08 DIAGNOSIS — C7951 Secondary malignant neoplasm of bone: Secondary | ICD-10-CM | POA: Insufficient documentation

## 2021-05-08 DIAGNOSIS — R599 Enlarged lymph nodes, unspecified: Secondary | ICD-10-CM | POA: Insufficient documentation

## 2021-05-08 DIAGNOSIS — W540XXA Bitten by dog, initial encounter: Secondary | ICD-10-CM | POA: Insufficient documentation

## 2021-05-08 DIAGNOSIS — C7931 Secondary malignant neoplasm of brain: Secondary | ICD-10-CM | POA: Insufficient documentation

## 2021-05-08 NOTE — Progress Notes (Signed)
[name] is here today for follow up post radiation to the lung.  Lung Side: Right, completed on 08/08/20  Does the patient complain of any of the following: Pain:Patient denies pain Shortness of breath w/wo exertion: no Cough: productive in the morning.  Hemoptysis: no Pain with swallowing: no Swallowing/choking concerns: no Appetite: good Energy Level: Patient reports energy level is improving.  Post radiation skin Changes: intact.    Patient also completed radiation therapy to brain on 03/22/21. Patient reports having headaches, reports having 1-2 headaches per week. Reports having dizziness and issues with balance. Denies visual or auditory changes. Noted scalp to be peeling, skin continues to be slightly hyperpigmented. Patient denies slurred speech or nausea or vomiting    Additional comments if applicable:   Vitals:   05/08/21 1044  BP: 113/72  Pulse: 83  Resp: 20  Temp: 97.9 F (36.6 C)  SpO2: 94%  Weight: 136 lb 9.6 oz (62 kg)  Height: 5' 6.75" (1.695 m)

## 2021-05-09 DIAGNOSIS — F339 Major depressive disorder, recurrent, unspecified: Secondary | ICD-10-CM | POA: Diagnosis not present

## 2021-05-09 DIAGNOSIS — F411 Generalized anxiety disorder: Secondary | ICD-10-CM | POA: Diagnosis not present

## 2021-05-16 DIAGNOSIS — R799 Abnormal finding of blood chemistry, unspecified: Secondary | ICD-10-CM | POA: Diagnosis not present

## 2021-05-16 DIAGNOSIS — E1165 Type 2 diabetes mellitus with hyperglycemia: Secondary | ICD-10-CM | POA: Diagnosis not present

## 2021-05-16 DIAGNOSIS — D519 Vitamin B12 deficiency anemia, unspecified: Secondary | ICD-10-CM | POA: Diagnosis not present

## 2021-05-16 DIAGNOSIS — D509 Iron deficiency anemia, unspecified: Secondary | ICD-10-CM | POA: Diagnosis not present

## 2021-05-16 DIAGNOSIS — I10 Essential (primary) hypertension: Secondary | ICD-10-CM | POA: Diagnosis not present

## 2021-05-16 DIAGNOSIS — E559 Vitamin D deficiency, unspecified: Secondary | ICD-10-CM | POA: Diagnosis not present

## 2021-05-17 DIAGNOSIS — H9203 Otalgia, bilateral: Secondary | ICD-10-CM | POA: Diagnosis not present

## 2021-05-18 IMAGING — CT CT CHEST W/ CM
2 of 3 series · 15 of 36 positions shown, 18 images · IV contrast (OMNIPAQUE)
Comparison: 07/14/2020

CLINICAL DATA: Small cell lung cancer with brain metastases,
chemotherapy/XRT complete

EXAM:
CT CHEST WITH CONTRAST
TECHNIQUE: Multidetector CT imaging of the chest was performed during
intravenous contrast administration.
CONTRAST:  75mL OMNIPAQUE IOHEXOL 300 MG/ML  SOLN

[Series 2: axial st · axial · 0.70mm/px · z∈[-357,-79]mm · 12 of 165 slices shown, 15 images]
[im 13/165  mediastinal]
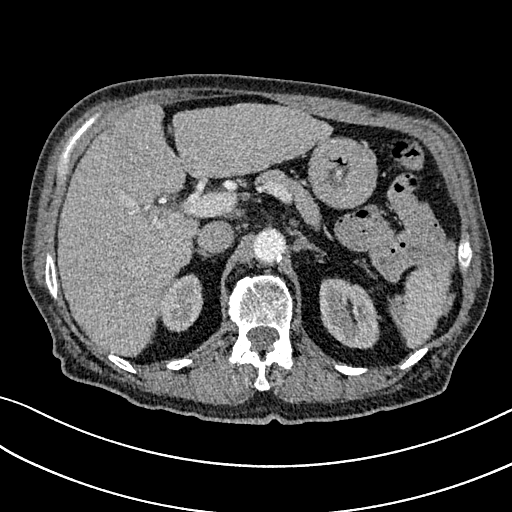
[im 13/165  lung]
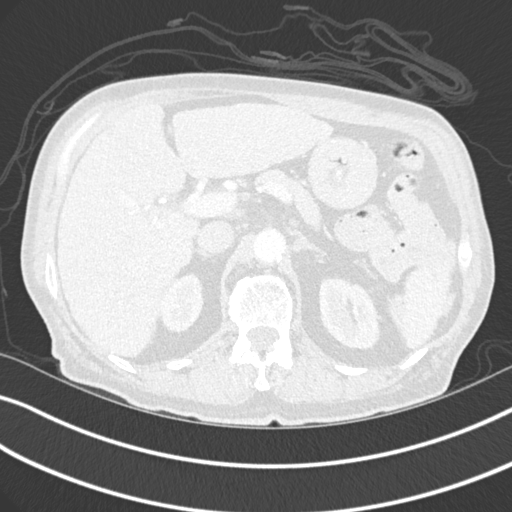
[im 25/165  lung]
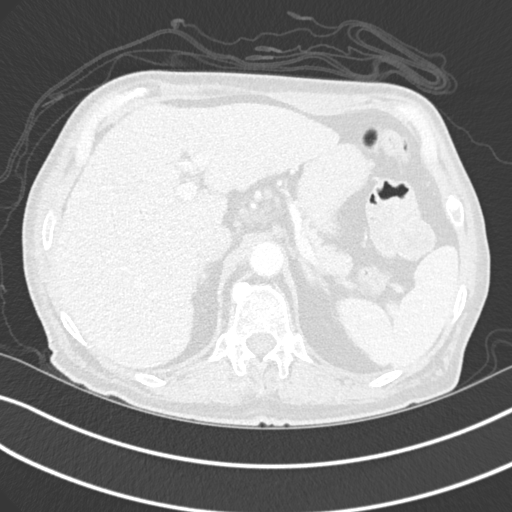
[im 37/165  lung]
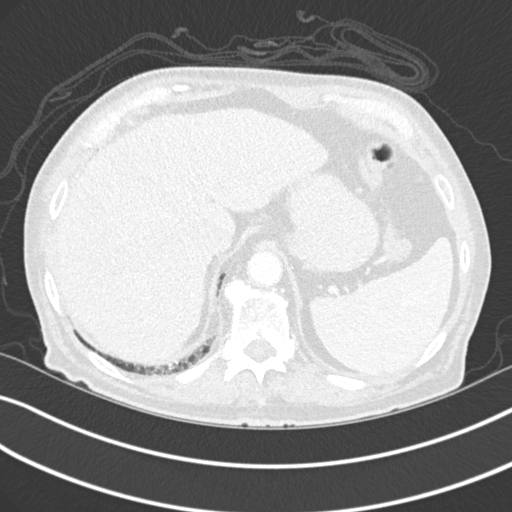
[im 49/165  lung]
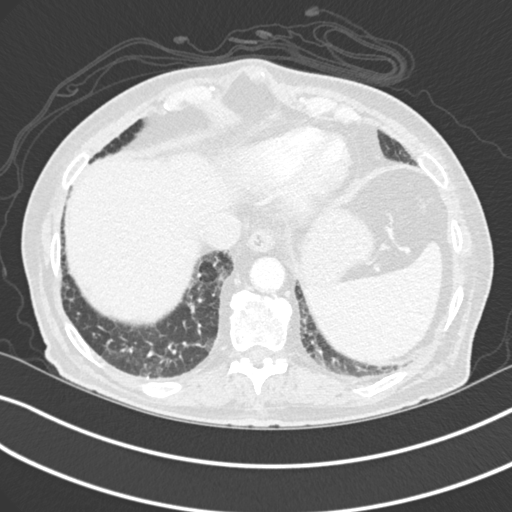
[im 61/165  mediastinal]
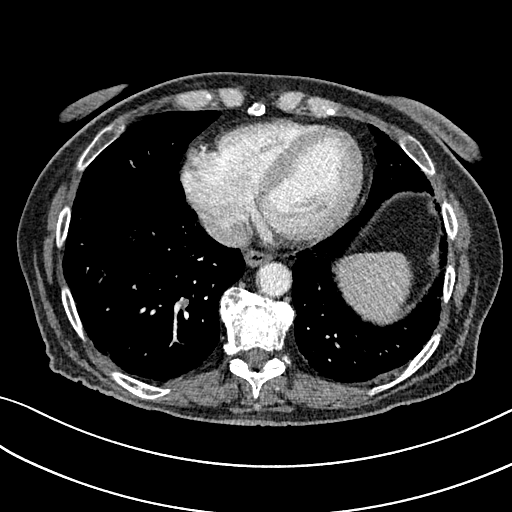
[im 61/165  lung]
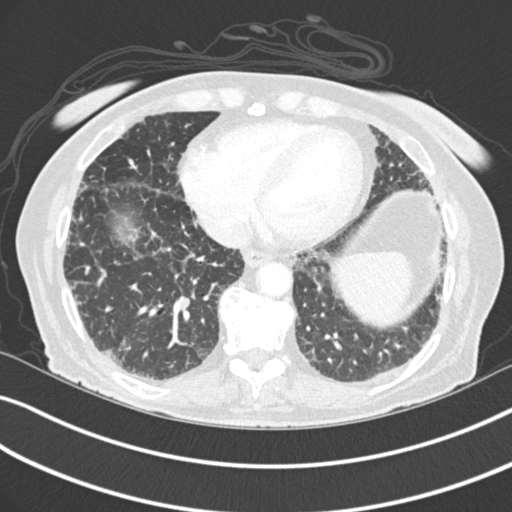
[im 73/165  lung]
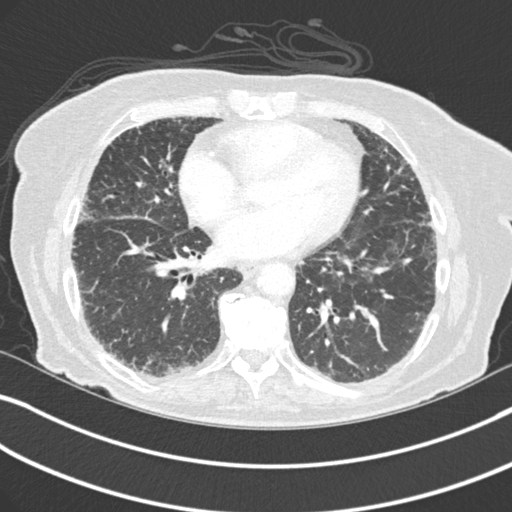
[im 92/165  lung]
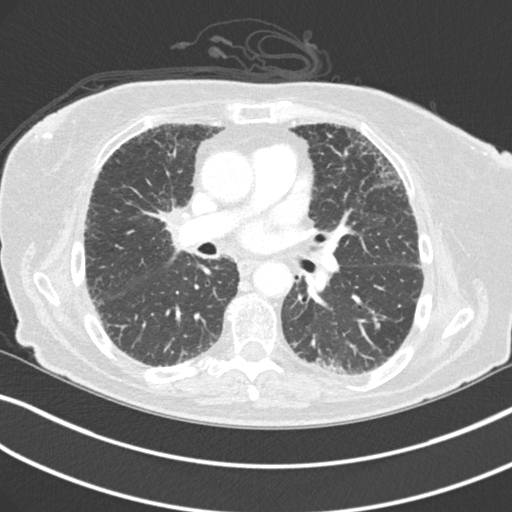
[im 104/165  lung]
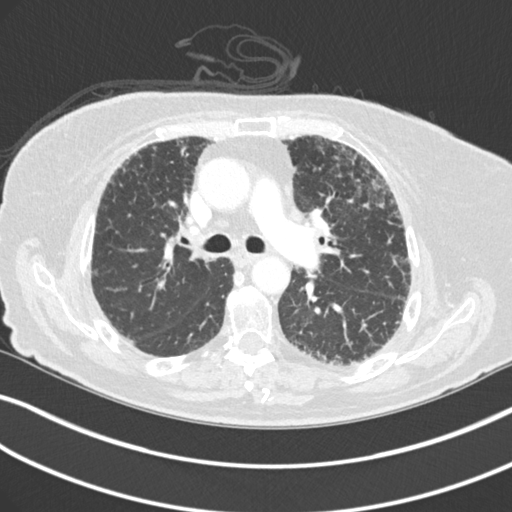
[im 116/165  mediastinal]
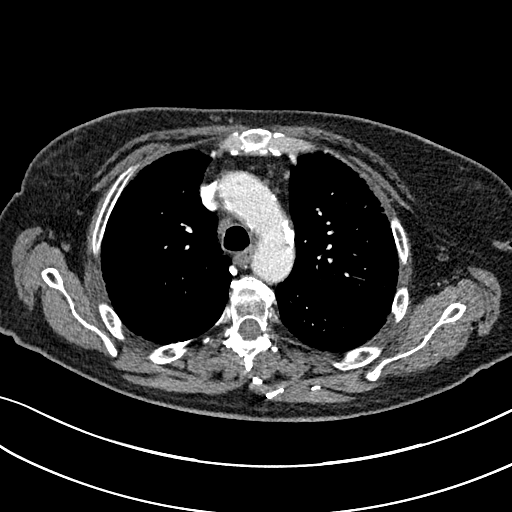
[im 116/165  lung]
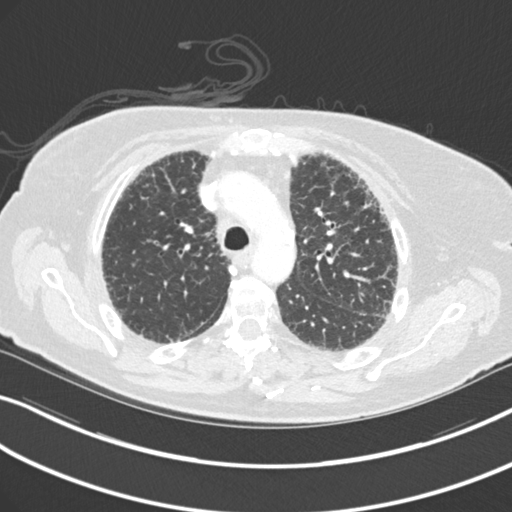
[im 128/165  lung]
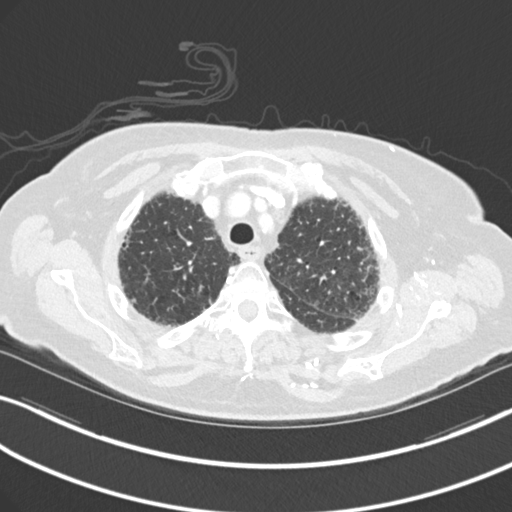
[im 140/165  lung]
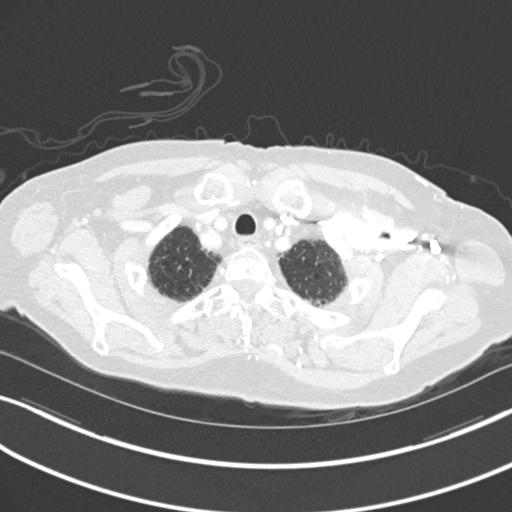
[im 152/165  lung]
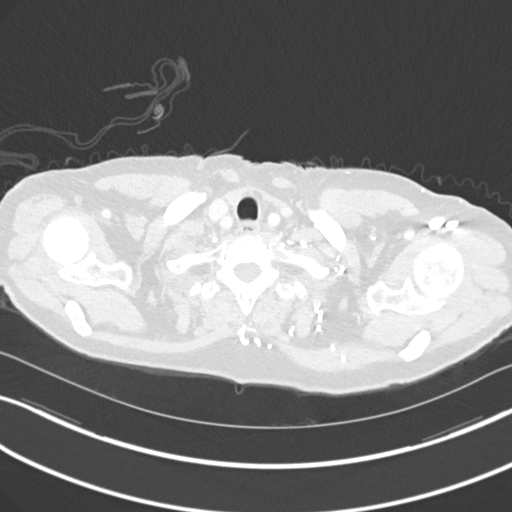

[Series 6: coronal · coronal · 0.67mm/px · 3 of 144 slices shown]
[im 29/144  lung]
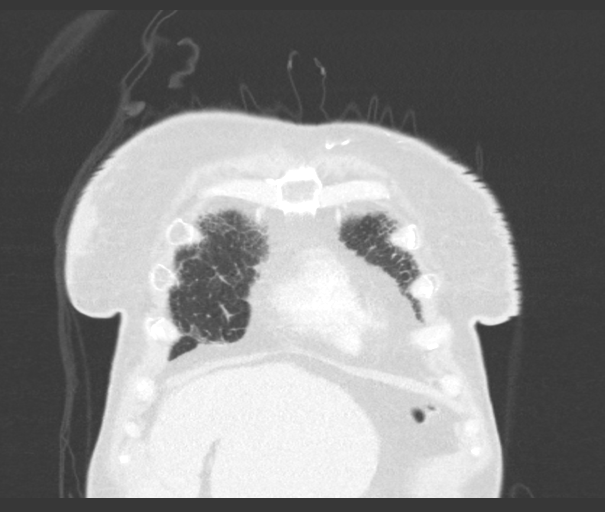
[im 58/144  lung]
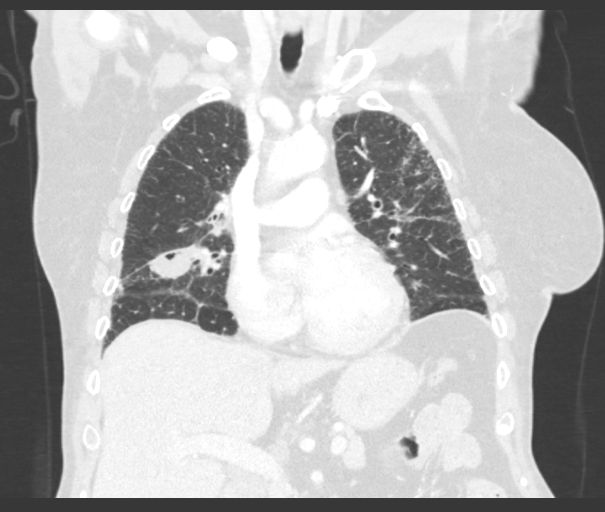
[im 86/144  lung]
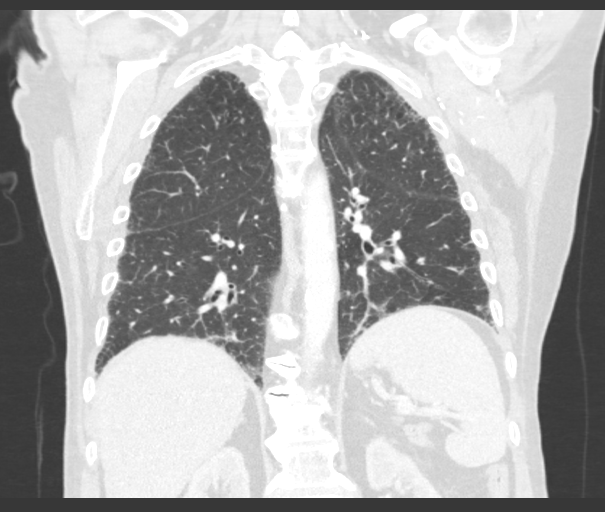

[15 of 36 positions shown; findings below may reference images not displayed]

FINDINGS: Cardiovascular: The heart is normal in size. No pericardial
effusion.

No evidence of thoracic aortic aneurysm. Atherosclerotic
calcifications of the aortic arch.

Three vessel coronary atherosclerosis.

Mediastinum/Nodes: Small mediastinal lymph nodes which do not meet
pathologic CT size criteria, including a dominant 9 mm short axis
subcarinal node, previously 10 mm. Additional 12 mm short axis right
hilar node, previously 13 mm. These are presumed to reflect
improving nodal metastases in this clinical context.

Visualized thyroid is unremarkable.

Lungs/Pleura: 2.3 x 3.5 cm mass in the posterior right middle lobe
(series 5/image 87), previously 2.6 x 4.3 cm, corresponding to the
patient's known primary bronchogenic neoplasm.

Associated subpleural reticulation/fibrosis in the lungs
bilaterally, likely reflecting superimposed chronic interstitial
lung disease.

No focal consolidation.

No pleural effusion or pneumothorax.

Upper Abdomen: Visualized upper abdomen is notable for a mildly
nodular hepatic contour, raising the possibility of cirrhosis. Prior
cholecystectomy. Vascular calcifications.

Musculoskeletal: Degenerative changes of the visualized
thoracolumbar spine.
IMPRESSION: 3.5 cm mass in the posterior right middle lobe, corresponding to the
patient's known primary bronchogenic neoplasm, decreased.

Small thoracic nodal metastases, mildly improved.

Aortic Atherosclerosis ([id number]).

## 2021-05-23 ENCOUNTER — Telehealth: Payer: Self-pay | Admitting: Pulmonary Disease

## 2021-05-23 NOTE — Telephone Encounter (Signed)
Call returned to patient, confirmed DOB. Patient states she does not feel she needs to use Trelegy any longer stating she is no longer wheezing or SOB. She is wanting to know if she can stop using it. She states she does have to use her rescue inhaler at all. I inquired as to whether she has been using the trelegy daily and she confirms she has. She states its just something else I have to pay for that I do not need.   VS please advise.

## 2021-05-23 NOTE — Telephone Encounter (Signed)
Okay to have her try stopping trelegy.  She can continue to use albuterol two puffs every 6 hours as needed for cough, wheeze, or chest congestion.  She should resume trelegy and call the office if her respiratory symptoms get worse.

## 2021-05-23 NOTE — Telephone Encounter (Signed)
Call returned to patient, made aware of VS recommendations. Voiced understanding.   Nothing further needed at this time.

## 2021-05-24 ENCOUNTER — Ambulatory Visit: Payer: Medicare HMO | Admitting: Cardiology

## 2021-05-29 DIAGNOSIS — H6123 Impacted cerumen, bilateral: Secondary | ICD-10-CM | POA: Diagnosis not present

## 2021-05-29 DIAGNOSIS — H9203 Otalgia, bilateral: Secondary | ICD-10-CM | POA: Diagnosis not present

## 2021-06-03 DIAGNOSIS — E1165 Type 2 diabetes mellitus with hyperglycemia: Secondary | ICD-10-CM | POA: Diagnosis not present

## 2021-06-03 DIAGNOSIS — R799 Abnormal finding of blood chemistry, unspecified: Secondary | ICD-10-CM | POA: Diagnosis not present

## 2021-06-03 DIAGNOSIS — N1832 Chronic kidney disease, stage 3b: Secondary | ICD-10-CM | POA: Diagnosis not present

## 2021-06-03 DIAGNOSIS — I10 Essential (primary) hypertension: Secondary | ICD-10-CM | POA: Diagnosis not present

## 2021-06-03 DIAGNOSIS — Z0001 Encounter for general adult medical examination with abnormal findings: Secondary | ICD-10-CM | POA: Diagnosis not present

## 2021-06-03 DIAGNOSIS — K7460 Unspecified cirrhosis of liver: Secondary | ICD-10-CM | POA: Diagnosis not present

## 2021-06-03 DIAGNOSIS — E782 Mixed hyperlipidemia: Secondary | ICD-10-CM | POA: Diagnosis not present

## 2021-06-03 DIAGNOSIS — K219 Gastro-esophageal reflux disease without esophagitis: Secondary | ICD-10-CM | POA: Diagnosis not present

## 2021-06-03 DIAGNOSIS — E039 Hypothyroidism, unspecified: Secondary | ICD-10-CM | POA: Diagnosis not present

## 2021-06-06 DIAGNOSIS — F339 Major depressive disorder, recurrent, unspecified: Secondary | ICD-10-CM | POA: Diagnosis not present

## 2021-06-06 DIAGNOSIS — F411 Generalized anxiety disorder: Secondary | ICD-10-CM | POA: Diagnosis not present

## 2021-06-06 IMAGING — MR MR HEAD WO/W CM
14 series · 48 of 48 positions shown · IV contrast (gadavist)
Comparison: 06/09/2019

CLINICAL DATA: Metastatic lung cancer restaging

EXAM:
MRI HEAD WITHOUT AND WITH CONTRAST
TECHNIQUE: Multiplanar, multiecho pulse sequences of the brain and surrounding
structures were obtained without and with intravenous contrast.
CONTRAST:  6mL GADAVIST GADOBUTROL 1 MMOL/ML IV SOLN

[Series 5: DWI · axial · 3.0mm · 1.36mm/px · z∈[-20,+131]mm · 5 of 104 slices shown (1 of 4)]
[im 1/104]
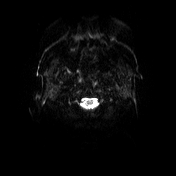
[im 26/104]
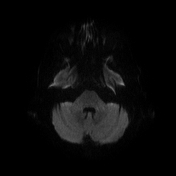
[im 52/104]
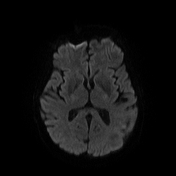
[im 78/104]
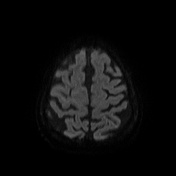
[im 104/104]
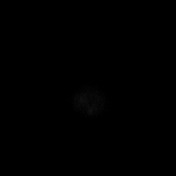

[Series 6: DWI · axial · 3.0mm · 1.36mm/px · z∈[-20,+131]mm · 3 of 52 slices shown (2 of 4)]
[im 1/52]
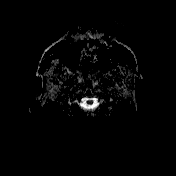
[im 26/52]
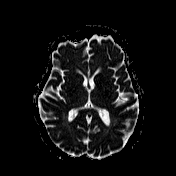
[im 52/52]
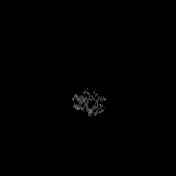

[Series 7: T1 · sagittal · 5.0mm · 0.75mm/px · 1 of 24 slices shown (1 of 2)]
[im 1/24]
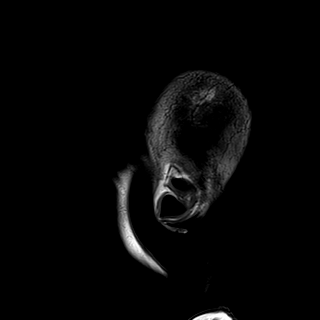

[Series 8: T2 · axial · 5.0mm · 0.62mm/px · 1 of 26 slices shown]
[im 1/26]
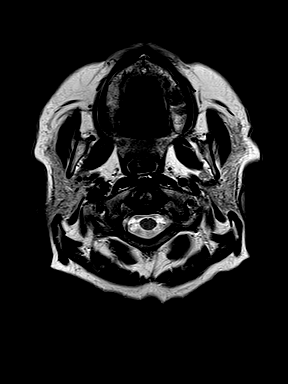

[Series 9: mip_images(sw) · axial · 24.0mm · 0.75mm/px · z∈[-18,+123]mm · 3 of 49 slices shown]
[im 1/49]
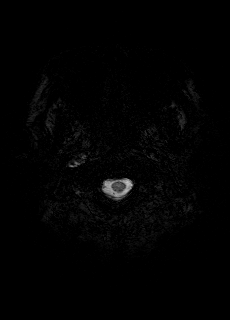
[im 25/49]
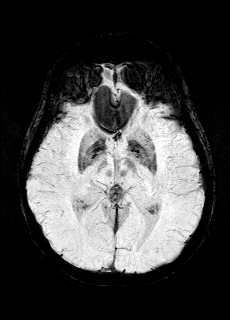
[im 49/49]
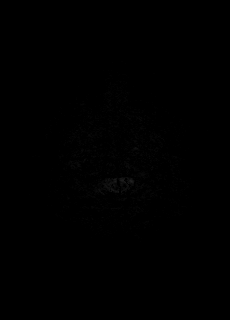

[Series 10: swi_images · axial · 3.0mm · 0.75mm/px · z∈[-29,+134]mm · 3 of 56 slices shown]
[im 1/56]
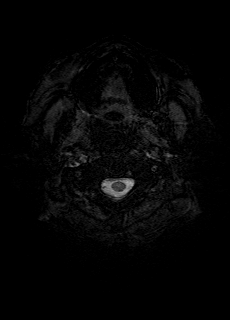
[im 28/56]
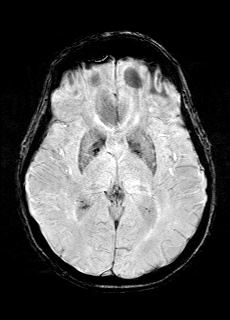
[im 56/56]
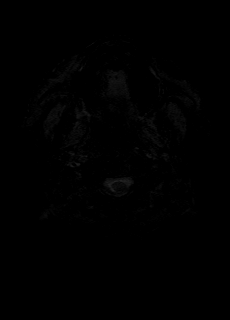

[Series 11: FLAIR · axial · 3.0mm · 0.75mm/px · z∈[-25,+131]mm · 3 of 54 slices shown]
[im 1/54]
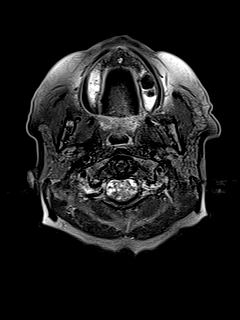
[im 27/54]
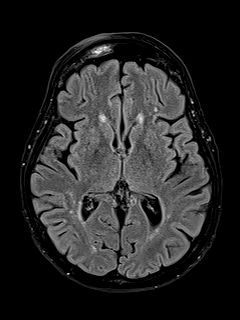
[im 54/54]
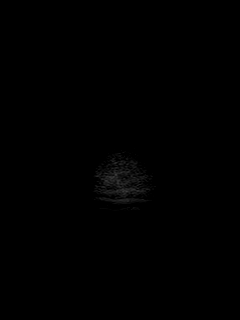

[Series 12: T1 · axial · 1.0mm · 0.94mm/px · z∈[-25,+131]mm · 9 of 160 slices shown (2 of 2)]
[im 1/160]
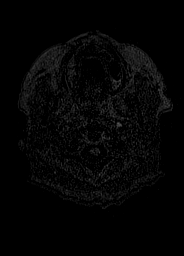
[im 20/160]
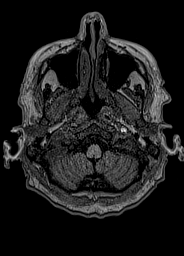
[im 40/160]
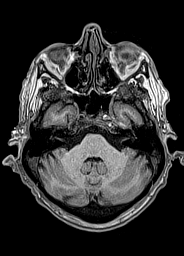
[im 60/160]
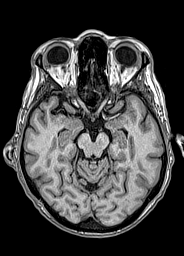
[im 80/160]
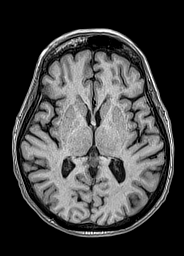
[im 100/160]
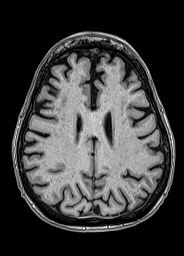
[im 120/160]
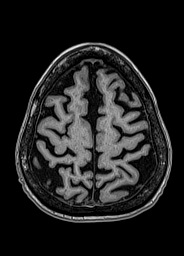
[im 140/160]
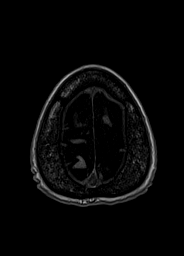
[im 160/160]
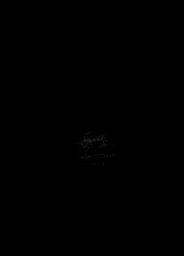

[Series 13: DWI · coronal · 5.0mm · 1.31mm/px · 4 of 72 slices shown (3 of 4)]
[im 1/72]
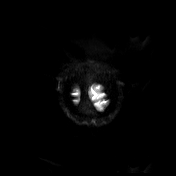
[im 24/72]
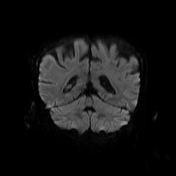
[im 48/72]
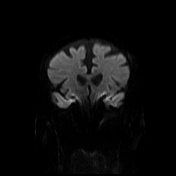
[im 72/72]
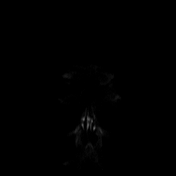

[Series 14: DWI · coronal · 5.0mm · 1.31mm/px · 2 of 36 slices shown (4 of 4)]
[im 1/36]
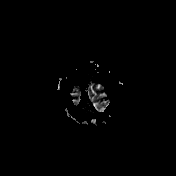
[im 36/36]
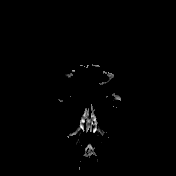

[Series 15: T2 post-contrast · coronal · 5.0mm · 0.57mm/px · 2 of 28 slices shown]
[im 1/28]
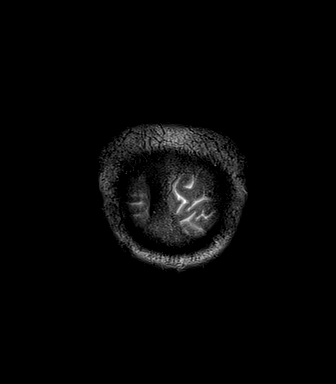
[im 28/28]
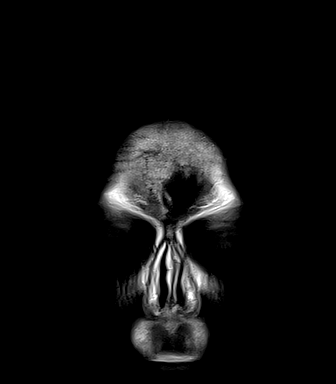

[Series 16: T1 post-contrast · axial · 1.0mm · 0.94mm/px · z∈[-25,+131]mm · 9 of 160 slices shown (1 of 3)]
[im 1/160]
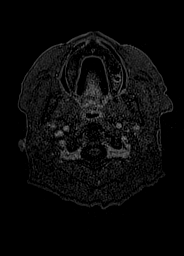
[im 20/160]
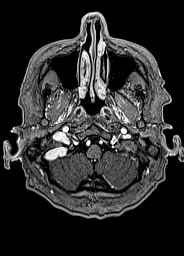
[im 40/160]
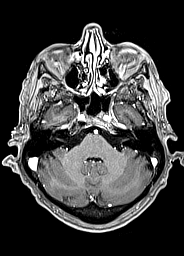
[im 60/160]
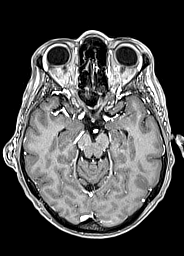
[im 80/160]
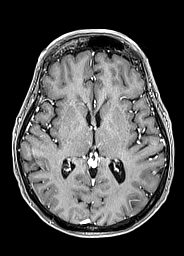
[im 100/160]
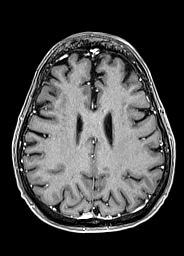
[im 120/160]
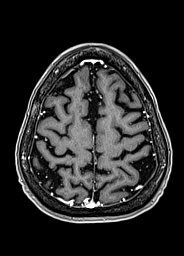
[im 140/160]
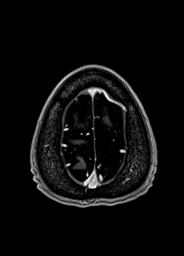
[im 160/160]
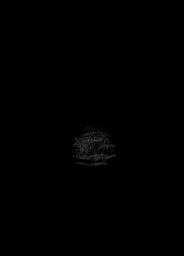

[Series 17: T1 post-contrast · coronal · 5.0mm · 0.43mm/px · 2 of 28 slices shown (2 of 3)]
[im 1/28]
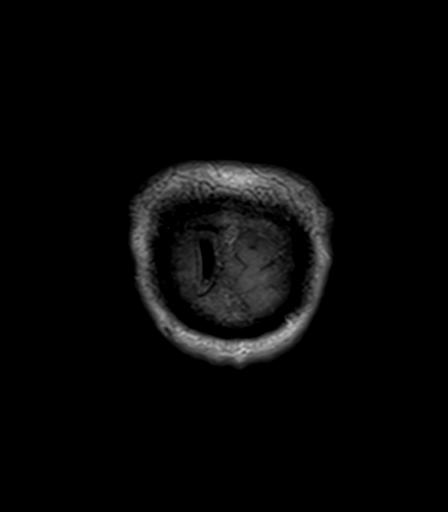
[im 28/28]
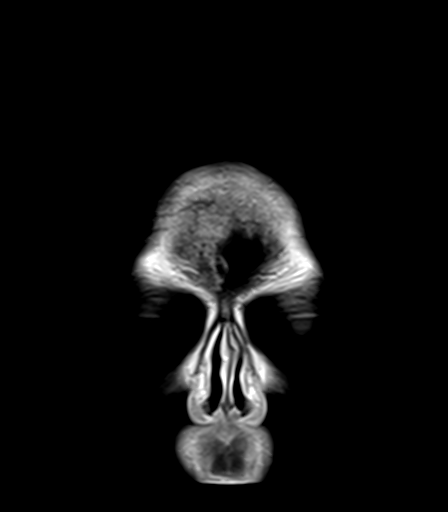

[Series 18: T1 post-contrast · sagittal · 5.0mm · 0.75mm/px · 1 of 24 slices shown (3 of 3)]
[im 1/24]
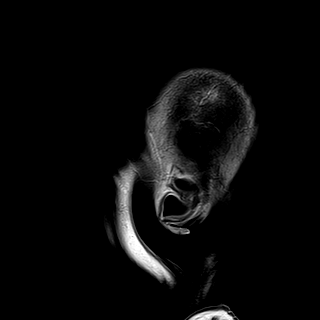

[48 of 48 positions shown; findings below may reference images not displayed]

FINDINGS: BRAIN

New Lesions: None.

Larger lesions: None.

Stable or Smaller lesions: The cerebellar lesion described on prior
is no longer seen.

Other Brain findings: Mild chronic white matter disease. No infarct,
hemorrhage, hydrocephalus, or collection.

Vascular: Unremarkable

Skull and upper cervical spine: Negative for focal marrow lesion

Sinuses/Orbits: Negative
IMPRESSION: The previously identified cerebellar lesion is no longer seen. No
new abnormality or visible metastatic disease today.

## 2021-06-20 ENCOUNTER — Encounter: Payer: Self-pay | Admitting: Gastroenterology

## 2021-06-20 ENCOUNTER — Other Ambulatory Visit: Payer: Self-pay

## 2021-06-20 ENCOUNTER — Telehealth: Payer: Self-pay | Admitting: Internal Medicine

## 2021-06-20 ENCOUNTER — Ambulatory Visit: Payer: Medicare HMO | Admitting: Gastroenterology

## 2021-06-20 ENCOUNTER — Other Ambulatory Visit: Payer: Medicare HMO

## 2021-06-20 VITALS — BP 119/64 | HR 91 | Temp 96.9°F | Ht 66.5 in | Wt 131.6 lb

## 2021-06-20 DIAGNOSIS — K7460 Unspecified cirrhosis of liver: Secondary | ICD-10-CM

## 2021-06-20 DIAGNOSIS — R197 Diarrhea, unspecified: Secondary | ICD-10-CM

## 2021-06-20 DIAGNOSIS — K219 Gastro-esophageal reflux disease without esophagitis: Secondary | ICD-10-CM | POA: Diagnosis not present

## 2021-06-20 MED ORDER — COLESTIPOL HCL 1 G PO TABS: 3.0000 g | ORAL_TABLET | Freq: Every day | ORAL | 5 refills | Status: DC

## 2021-06-20 NOTE — Telephone Encounter (Signed)
R/s appt per 9/6 sch msg. Called pt, no answer. Left msg with updated appt date and time.

## 2021-06-20 NOTE — Patient Instructions (Addendum)
We will follow up scan results as available.  When you feel strong enough, you can go for Hep A and Hep B vaccinations. It is a series of three shots. Check with Dr. [place] Crane and see if he can give them. If not, your local pharmacy may give[facility] an [facility] as well.  Continue colestid 2-3 tablets daily (do not take within 2 hours of other medications) for diarrhea.  Continue pantoprazole once daily for your heartburn, gastritis.  Return to the office in six months or call sooner if needed.

## 2021-06-20 NOTE — Progress Notes (Signed)
Primary Care Physician: [name], MD  Primary Gastroenterologist:  Garfield Cornea, MD   Chief Complaint  Patient presents with   Gastroesophageal Reflux    Doing ok   Diarrhea    Occ but better   Cirrhosis    HPI: [name] is a 70 y.o. female here for follow-up.  Patient last seen in March.  She has a history of IDA, diarrhea, cirrhosis by CT (March 2021).  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  Since her last visit she was diagnosed with solitary brain metastasis in 02/2021. She completed whole brain radiation 03/2021. She states she has some memory issues since then. In fact, she states she did not know she has cirrhosis which we have been following her for since 02/2020.   She denies abdominal pain. Her chronic diarrhea is well controlled on Colestid 2-3 grams daily. Generally has one large formed BM daily. Eating good. Weight down 5 pounds since 03/2021. Denies heartburn, states on PPI due to her stomach. Feels tires all the time since radiation. Has appt with ENT for vertigo, new since radiation.  Upcoming CT chest/abd/pelvis tomorrow.   Previous work-up:  March 2021 hospitalized with pneumonia.  Cirrhosis with small amount ascites noted on imaging at that time.  Meld sodium of 15.  Hemoglobin in the 7-8 range.  Hematochezia for several months.  Seen by [facility], Dr. Therisa Doyne.EGD/TCS completed during admission showing mild gastritis (no specimen collected), single polyp removed from colon (path-lipoma), single non-bleeding colonic angioectasia. Treated with argon plasma coagulation (APC). Friable (with contact bleeding) mucosa in the transverse colon. Clip (MR conditional) was placed. Hemoglobin on admission was 8.1 and at time of D/C was 7.9. she received one unit of prbcs. Patient had capsule endoscopy 03/22/20: few gastric erosions and small AVM. Few scattered small bowel erosions. Couple of small bowel AVMs.   Patient diagnosed with lung cancer August 2021.   Completed chemo/radiation.   CTA completed August 2021 to further evaluate chronic diarrhea.  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  Discussed with Dr. Sherren Mocha Early who she was already established with.  He felt aortic thrombus was insignificant and would not require further follow-up or treatment.  No evidence of mesenteric ischemia.  No need to further treat bilateral renal artery stenosis unless she had progressive renal insufficiency, baseline creatinine at the time was 1.5.  Current Outpatient Medications  Medication Sig Dispense Refill   albuterol (VENTOLIN HFA) 108 (90 Base) MCG/ACT inhaler Inhale 2 puffs into the lungs every 4 (four) hours as needed for wheezing or shortness of breath. 1 each 3   aspirin-acetaminophen-caffeine (EXCEDRIN MIGRAINE) 250-250-65 MG tablet Take 2 tablets by mouth daily as needed for headache. Up to 4 times a week     Benzocaine (BOIL-EASE EX) Apply 1 application topically daily as needed (Boil).     carbamide peroxide (DEBROX) 6.5 % OTIC solution Place 5 drops into both ears daily as needed (ears).     Cholecalciferol (VITAMIN D3) 50 MCG (2000 UT) TABS Take 8,000 Units by mouth daily.     colestipol (COLESTID) 1 g tablet Take 3 g by mouth daily.     diazepam (VALIUM) 5 MG tablet Take 2.5 mg by mouth at bedtime. 1/2 tab qhs     diclofenac Sodium (VOLTAREN) 1 % GEL Apply topically as needed.     empagliflozin (JARDIANCE) 10 MG TABS tablet Take by mouth daily.     Ferrous  Sulfate (IRON) 325 (65 Fe) MG TABS Take 1 tablet by mouth daily.     folic acid (FOLVITE) 1 MG tablet Take 1 mg by mouth daily.     furosemide (LASIX) 40 MG tablet Take 1 tablet (40 mg total) by mouth daily. 30 tablet 1   hydrocortisone cream 1 % Apply 1 application topically daily as needed for itching.     Hydrocortisone, Perianal, (PROCTO-PAK) 1 % CREA Apply anorectally twice daily for two weeks. 28 g 0   levocetirizine (XYZAL) 5 MG tablet Take 5 mg by  mouth at bedtime.     levothyroxine (SYNTHROID) 112 MCG tablet Take 112 mcg by mouth daily.     metFORMIN (GLUCOPHAGE-XR) 500 MG 24 hr tablet Take 1,000 mg by mouth 2 (two) times daily.     Multiple Vitamins-Minerals (MULTIVITAMIN WITH MINERALS) tablet Take 2 tablets by mouth daily. Adult 50+     neomycin-bacitracin-polymyxin (NEOSPORIN) OINT Apply 1 application topically daily as needed for irritation or wound care.     oxyCODONE-acetaminophen (PERCOCET/ROXICET) 5-325 MG tablet Take 0.5 tablets by mouth 2 (two) times daily as needed for severe pain.     pantoprazole (PROTONIX) 40 MG tablet Take 40 mg by mouth daily.     PARoxetine (PAXIL) 20 MG tablet Take 20 mg by mouth daily.     rosuvastatin (CRESTOR) 20 MG tablet Take 20 mg by mouth daily as needed.     traZODone (DESYREL) 150 MG tablet Take 75 mg by mouth at bedtime.     No current facility-administered medications for this visit.    Allergies as of 06/20/2021 - Review Complete 06/20/2021  Allergen Reaction Noted   Iohexol Shortness Of Breath and Other (See Comments) 02/14/2005   Tape Other (See Comments) 03/31/2014   Betadine [povidone iodine] Rash 02/28/2021   Ciprofloxacin Rash 02/04/2007   Latex Rash and Other (See Comments) 03/31/2014   Penicillins Swelling 02/04/2007   Povidone-iodine Rash 02/04/2007    ROS:  General: Negative for anorexia, weight loss, fever, chills, fatigue, weakness. ENT: Negative for hoarseness, difficulty swallowing , nasal congestion. CV: Negative for chest pain, angina, palpitations, dyspnea on exertion, peripheral edema.  Respiratory: Negative for dyspnea at rest, dyspnea on exertion, cough, sputum, wheezing.  GI: See history of present illness. GU:  Negative for dysuria, hematuria, urinary incontinence, urinary frequency, nocturnal urination.  Endo: Negative for unusual weight change.    Physical Examination:   BP 119/64   Pulse 91   Temp (!) 96.9 F (36.1 C) (Temporal)   Ht 5' 6.5"  (1.689 m)   Wt 131 lb 9.6 oz (59.7 kg)   BMI 20.92 kg/m   General: Well-nourished, well-developed in no acute distress.  Eyes: No icterus. Mouth: Oropharyngeal mucosa moist and pink , no lesions erythema or exudate. Lungs: Clear to auscultation bilaterally.  Heart: Regular rate and rhythm, no murmurs rubs or gallops.  Abdomen: Bowel sounds are normal, nontender, nondistended, no hepatosplenomegaly or masses, no abdominal bruits or hernia , no rebound or guarding.   Extremities: No lower extremity edema. No clubbing or deformities. Neuro: Alert and oriented x 4   Skin: Warm and dry, no jaundice.   Psych: Alert and cooperative, normal mood and affect.  Labs:  Lab Results  Component Value Date   WBC 9.2 03/20/2021   HGB 12.2 03/20/2021   HCT 37.4 03/20/2021   MCV 95.2 03/20/2021   PLT 107 (L) 03/20/2021   Lab Results  Component Value Date   CREATININE 1.47 (H) 03/20/2021  BUN 34 (H) 03/20/2021   NA 134 (L) 03/20/2021   K 4.9 03/20/2021   CL 95 (L) 03/20/2021   CO2 27 03/20/2021   Lab Results  Component Value Date   ALT 57 (H) 03/20/2021   AST 15 03/20/2021   ALKPHOS 51 03/20/2021   BILITOT 0.6 03/20/2021   Lab Results  Component Value Date   IRON 34 03/02/2021   TIBC 475 (H) 03/02/2021   FERRITIN 68 03/02/2021   Lab Results  Component Value Date   VITAMINB12 1,753 (H) 03/02/2021   Lab Results  Component Value Date   FOLATE 31.7 03/02/2021     05/16/21: Cre 0.88, Tbili 0.3, AP 56, AST 16, ALT 10, alb 4.2, H/H 11.4/35.6, platelets 203,000, Na 136  Imaging Studies: No results found.   Assessment: 70 y/o female with history of recent diagnosis of cirrhosis likely due to NASH (well compensated at this time), chronic diarrhea, IDA, weight loss, lung cancer presenting for follow up.   NASH cirrhosis: Not immune to hepatitis A or B. Overall has been stable. Recent LFTs, platelets, Creatinine, Sodium all normal. INR not available therefore MELD not calculated.  Follow up imaging tomorrow.   IDA: EGD/capsule/capsule study completed as noted above. Most recent H/H normal. Continue to monitor.  Chronic diarrhea: Occurring for years.  Previous random colon biopsies negative.  Celiac screen negative.  Symptoms present since gallbladder removed remotely.  Colestid 3 g daily.  Weigth Loss: down about five pounds since brain radiation. Appetite good. Will continue to monitor.   GERD/gastritis: on PPI. No symptoms at this time.   Plan: Hepatitis A and B vaccinations when she feels strong enough. Follow-up CT chest, abdomen, pelvis planned for tomorrow. Continue colestid 2-3 tablets daily for diarrhea.  Continue pantoprazole once daily for your heartburn, gastritis.  Return to the office in six months or call sooner if needed.

## 2021-06-21 ENCOUNTER — Ambulatory Visit (HOSPITAL_COMMUNITY)
Admission: RE | Admit: 2021-06-21 | Discharge: 2021-06-21 | Disposition: A | Discharge status: Home / Self Care | Payer: Medicare HMO | Source: Ambulatory Visit | Attending: Physician Assistant | Admitting: Physician Assistant

## 2021-06-21 ENCOUNTER — Encounter (HOSPITAL_COMMUNITY): Payer: Self-pay

## 2021-06-21 ENCOUNTER — Inpatient Hospital Stay: Payer: Medicare HMO | Attending: Internal Medicine

## 2021-06-21 DIAGNOSIS — I358 Other nonrheumatic aortic valve disorders: Secondary | ICD-10-CM | POA: Diagnosis not present

## 2021-06-21 DIAGNOSIS — I2510 Atherosclerotic heart disease of native coronary artery without angina pectoris: Secondary | ICD-10-CM | POA: Diagnosis not present

## 2021-06-21 DIAGNOSIS — C342 Malignant neoplasm of middle lobe, bronchus or lung: Secondary | ICD-10-CM

## 2021-06-21 DIAGNOSIS — C3490 Malignant neoplasm of unspecified part of unspecified bronchus or lung: Secondary | ICD-10-CM | POA: Diagnosis not present

## 2021-06-21 DIAGNOSIS — C7931 Secondary malignant neoplasm of brain: Secondary | ICD-10-CM | POA: Insufficient documentation

## 2021-06-21 DIAGNOSIS — J449 Chronic obstructive pulmonary disease, unspecified: Secondary | ICD-10-CM | POA: Diagnosis not present

## 2021-06-21 DIAGNOSIS — J439 Emphysema, unspecified: Secondary | ICD-10-CM | POA: Diagnosis not present

## 2021-06-21 DIAGNOSIS — I700 Atherosclerosis of aorta: Secondary | ICD-10-CM | POA: Diagnosis not present

## 2021-06-21 DIAGNOSIS — K7689 Other specified diseases of liver: Secondary | ICD-10-CM | POA: Diagnosis not present

## 2021-06-21 LAB — CBC WITH DIFFERENTIAL (CANCER CENTER ONLY)
Abs Immature Granulocytes: 0.01 10*3/uL (ref 0.00–0.07)
Basophils Absolute: 0 10*3/uL (ref 0.0–0.1)
Basophils Relative: 1 %
Eosinophils Absolute: 0.4 10*3/uL (ref 0.0–0.5)
Eosinophils Relative: 7 %
HCT: 37.6 % (ref 36.0–46.0)
Hemoglobin: 11.8 g/dL — ABNORMAL LOW (ref 12.0–15.0)
Immature Granulocytes: 0 %
Lymphocytes Relative: 17 %
Lymphs Abs: 1 10*3/uL (ref 0.7–4.0)
MCH: 31.4 pg (ref 26.0–34.0)
MCHC: 31.4 g/dL (ref 30.0–36.0)
MCV: 100 fL (ref 80.0–100.0)
Monocytes Absolute: 0.5 10*3/uL (ref 0.1–1.0)
Monocytes Relative: 8 %
Neutro Abs: 3.9 10*3/uL (ref 1.7–7.7)
Neutrophils Relative %: 67 %
Platelet Count: 218 10*3/uL (ref 150–400)
RBC: 3.76 MIL/uL — ABNORMAL LOW (ref 3.87–5.11)
RDW: 14 % (ref 11.5–15.5)
WBC Count: 5.8 10*3/uL (ref 4.0–10.5)
nRBC: 0 % (ref 0.0–0.2)

## 2021-06-21 LAB — CMP (CANCER CENTER ONLY)
ALT: 11 U/L (ref 0–44)
AST: 13 U/L — ABNORMAL LOW (ref 15–41)
Albumin: 3.8 g/dL (ref 3.5–5.0)
Alkaline Phosphatase: 53 U/L (ref 38–126)
Anion gap: 12 (ref 5–15)
BUN: 8 mg/dL (ref 8–23)
CO2: 25 mmol/L (ref 22–32)
Calcium: 10.4 mg/dL — ABNORMAL HIGH (ref 8.9–10.3)
Chloride: 103 mmol/L (ref 98–111)
Creatinine: 1.06 mg/dL — ABNORMAL HIGH (ref 0.44–1.00)
GFR, Estimated: 57 mL/min — ABNORMAL LOW (ref >60)
Glucose, Bld: 106 mg/dL — ABNORMAL HIGH (ref 70–99)
Potassium: 4.5 mmol/L (ref 3.5–5.1)
Sodium: 140 mmol/L (ref 135–145)
Total Bilirubin: 0.4 mg/dL (ref 0.3–1.2)
Total Protein: 7.5 g/dL (ref 6.5–8.1)

## 2021-06-21 MED ORDER — DIPHENHYDRAMINE HCL 50 MG/ML IJ SOLN: 25.0000 mg | Freq: Once | INTRAMUSCULAR | Status: AC

## 2021-06-21 MED ORDER — IOHEXOL 350 MG/ML SOLN
80.0000 mL | Freq: Once | INTRAVENOUS | Status: AC | PRN
  Administered 2021-06-21: 80 mL via INTRAVENOUS

## 2021-06-21 MED ORDER — DIPHENHYDRAMINE HCL 50 MG/ML IJ SOLN
INTRAMUSCULAR | Status: AC
  Administered 2021-06-21: 25 mg via INTRAVENOUS
  Filled 2021-06-21: qty 1

## 2021-06-22 ENCOUNTER — Other Ambulatory Visit: Payer: Self-pay

## 2021-06-22 ENCOUNTER — Telehealth: Payer: Self-pay | Admitting: Internal Medicine

## 2021-06-22 ENCOUNTER — Telehealth: Payer: Self-pay | Admitting: Pulmonary Disease

## 2021-06-22 ENCOUNTER — Inpatient Hospital Stay (HOSPITAL_BASED_OUTPATIENT_CLINIC_OR_DEPARTMENT_OTHER): Payer: Medicare HMO | Admitting: Internal Medicine

## 2021-06-22 VITALS — BP 110/68 | HR 91 | Temp 98.4°F | Resp 19 | Ht 66.5 in | Wt 131.1 lb

## 2021-06-22 DIAGNOSIS — J449 Chronic obstructive pulmonary disease, unspecified: Secondary | ICD-10-CM | POA: Diagnosis not present

## 2021-06-22 DIAGNOSIS — C3490 Malignant neoplasm of unspecified part of unspecified bronchus or lung: Secondary | ICD-10-CM | POA: Diagnosis not present

## 2021-06-22 DIAGNOSIS — C342 Malignant neoplasm of middle lobe, bronchus or lung: Secondary | ICD-10-CM | POA: Diagnosis not present

## 2021-06-22 DIAGNOSIS — C7931 Secondary malignant neoplasm of brain: Secondary | ICD-10-CM | POA: Diagnosis not present

## 2021-06-22 MED ORDER — TRELEGY ELLIPTA 100-62.5-25 MCG/INH IN AEPB
1.0000 | INHALATION_SPRAY | Freq: Every day | RESPIRATORY_TRACT | 0 refills | Status: DC
Start: 2021-06-22 — End: 2021-09-22

## 2021-06-22 NOTE — Telephone Encounter (Signed)
ATC [name], [facility]

## 2021-06-22 NOTE — Telephone Encounter (Signed)
Spoke with [name] to let her know of the recs from Dr. [name] and she expressed understanding. She asked if they could get samples of Trelegy for patient Advised her I would leave them up front for pick up. Also told her if they decide they would like to come in and do a walk to call the office and ask to speak to the nurse because there is not a schedule to put her on so we would have to arrange a time for her to come in and do the walk. She expressed understanding. Nothing further needed at this time.

## 2021-06-22 NOTE — Telephone Encounter (Signed)
Scheduled appt per 9/8 LOS - mailed letter with appt date and time

## 2021-06-22 NOTE — Telephone Encounter (Signed)
Called and spoke with [name] who is calling because patient was recently diagnosed with emphysema. She states that she had CT scan yesterday 06/21/21 and they got called today saying that it showed Emphysema. She states patient was taking Trelegy but stopped because it was making her cough excessively. [name] asked her to start it back and she has now been using it for 4 days and has not been coughing as much. But they are wondering if that is the inhaler she needs to be on with the new DX of Emphysema. Patient has been coughing with oxygen saturation going into the 80s. Clarene Critchley states that if she can get her to do some deep breathing, she can get her to go above 90 but not higher than 93. Patient does not wear oxygen and has been complaining of low energy and being tired all the time. [name] states that patient will not tell you and she tells everyone that she has stopped smoking but she is smoking about 6 cigarettes a week but does not want it mentioned to patient unless she tells [place] herself.   Dr. [name] please advise as of right now you do not have any openings at [facility] office

## 2021-06-22 NOTE — Telephone Encounter (Signed)
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  These are not new findings and not significantly changed from last CT chest.  Trelegy is a good inhaler for COPD from emphysema.  She should continue this if she is able to tolerate use without side effects, such as coughing.  She can come to the office for a nurse visit to do a walk test.  If her SpO2 on room air is < 88%, then she would qualify for home oxygen.

## 2021-06-22 NOTE — Progress Notes (Signed)
[place] [phone]   [phone]  OFFICE PROGRESS NOTE  [name], MD [address] [place] [address]  DIAGNOSIS: Extensive stage (T3, N2, M1c) small cell lung cancer presented with right middle lobe lung mass in addition to right hilar and low right paratracheal adenopathy in addition to tiny solitary brain metastasis diagnosed in August 2021.  PRIOR THERAPY:  1) Palliative systemic chemotherapy with carboplatin for AUC of 5 on day 1, etoposide 100 mg/M2 on days 1, 2 and 3 with Neulasta support.    Status post 4 cycles.  This is concurrent with radiotherapy which was completed.   CURRENT THERAPY: Observation.  INTERVAL HISTORY: [name] 70 y.o. female returns to the clinic today for follow-up visit accompanied by her friend.  The patient is feeling fine today with no concerning complaints except for occasional shortness of breath with exertion.  She continues to have alopecia after her brain radiation.  She denied having any current chest pain but has mild cough with no hemoptysis.  She denied having any nausea, vomiting, diarrhea or constipation.  She denied having any headache or visual changes.  She has no weight loss or night sweats.  She had repeat CT scan of the chest, abdomen and pelvis performed recently and she is here for evaluation and discussion of her scan results.  MEDICAL HISTORY: Past Medical History:  Diagnosis Date   Acute on chronic diastolic CHF (congestive heart failure) ([facility]) 02/28/2021   Allergic rhinitis    Amputation of hand, right (HCC)    traumatic   Anemia    Anxiety    ASCVD (arteriosclerotic cardiovascular disease)    MI in 96 requiring BMS CX; DES to M1 in 2000;normal coronary angiography in 2004   Cholelithiasis    COPD (chronic obstructive pulmonary disease) (HCC)    DDD (degenerative disc disease), lumbar    Depression    Diabetes mellitus    Type II   Diarrhea    DVT (deep venous  thrombosis) ([facility])    patient said no   GERD (gastroesophageal reflux disease)    Headache    migraine   History of kidney stones    2006   History of radiation therapy 06/28/2020-08/08/2020   right Lung; Dr. [name]   History of radiation therapy 03/22/2021   brain 03/02/2021-03/22/2021   Dr [name]   Hyperlipidemia    Hypertension    Hypotension    Hypothyroidism    Low back pain    Myocardial infarction [facility]) 01/1995   Nephrolithiasis 2006   stone extraction    Panic attacks    Peripheral neuropathy    Peripheral vascular disease (HCC)    legs   Pneumonia 12/22/2019   Sciatic pain    right   Small cell lung cancer ([facility])    Tobacco abuse    Tremor     ALLERGIES:  is allergic to iohexol, tape, betadine [povidone iodine], ciprofloxacin, latex, penicillins, and povidone-iodine.  MEDICATIONS:  Current Outpatient Medications  Medication Sig Dispense Refill   albuterol (VENTOLIN HFA) 108 (90 Base) MCG/ACT inhaler Inhale 2 puffs into the lungs every 4 (four) hours as needed for wheezing or shortness of breath. 1 each 3   aspirin-acetaminophen-caffeine (EXCEDRIN MIGRAINE) 250-250-65 MG tablet Take 2 tablets by mouth daily as needed for headache. Up to 4 times a week     Benzocaine (BOIL-EASE EX) Apply 1 application topically daily as needed (Boil).  carbamide peroxide (DEBROX) 6.5 % OTIC solution Place 5 drops into both ears daily as needed (ears).     Cholecalciferol (VITAMIN D3) 50 MCG (2000 UT) TABS Take 8,000 Units by mouth daily.     colestipol (COLESTID) 1 g tablet Take 3 tablets (3 g total) by mouth daily. Do not take within 2 hours of other medications. 90 tablet 5   diazepam (VALIUM) 5 MG tablet Take 2.5 mg by mouth at bedtime. 1/2 tab qhs     diclofenac Sodium (VOLTAREN) 1 % GEL Apply topically as needed.     empagliflozin (JARDIANCE) 10 MG TABS tablet Take by mouth daily.     Ferrous Sulfate (IRON) 325 (65 Fe) MG TABS Take 1 tablet by mouth daily.     folic  acid (FOLVITE) 1 MG tablet Take 1 mg by mouth daily.     furosemide (LASIX) 40 MG tablet Take 1 tablet (40 mg total) by mouth daily. 30 tablet 1   hydrocortisone cream 1 % Apply 1 application topically daily as needed for itching.     Hydrocortisone, Perianal, (PROCTO-PAK) 1 % CREA Apply anorectally twice daily for two weeks. 28 g 0   levocetirizine (XYZAL) 5 MG tablet Take 5 mg by mouth at bedtime.     levothyroxine (SYNTHROID) 112 MCG tablet Take 112 mcg by mouth daily.     metFORMIN (GLUCOPHAGE-XR) 500 MG 24 hr tablet Take 1,000 mg by mouth 2 (two) times daily.     Multiple Vitamins-Minerals (MULTIVITAMIN WITH MINERALS) tablet Take 2 tablets by mouth daily. Adult 50+     neomycin-bacitracin-polymyxin (NEOSPORIN) OINT Apply 1 application topically daily as needed for irritation or wound care.     oxyCODONE-acetaminophen (PERCOCET/ROXICET) 5-325 MG tablet Take 0.5 tablets by mouth 2 (two) times daily as needed for severe pain.     pantoprazole (PROTONIX) 40 MG tablet Take 40 mg by mouth daily.     PARoxetine (PAXIL) 20 MG tablet Take 20 mg by mouth daily.     rosuvastatin (CRESTOR) 20 MG tablet Take 20 mg by mouth daily as needed.     traZODone (DESYREL) 150 MG tablet Take 75 mg by mouth at bedtime.     No current facility-administered medications for this visit.    SURGICAL HISTORY:  Past Surgical History:  Procedure Laterality Date   AGILE CAPSULE N/A 03/07/2020   Procedure: AGILE CAPSULE;  Surgeon: Daneil Dolin, MD;  [facility];  Service: Endoscopy;  Laterality: N/A;  7:30am   BACK SURGERY     fusion   BRONCHIAL NEEDLE ASPIRATION BIOPSY N/A 05/20/2020   Procedure: BRONCHIAL NEEDLE ASPIRATION BIOPSIES;  Surgeon: Collene Gobble, MD;  Location: [facility];  Service: Pulmonary;  Laterality: N/A;   CHOLECYSTECTOMY     COLONOSCOPY  01/2009   UQJ:[id number] rectum/repeat in 5 yrs   COLONOSCOPY N/A 04/29/2014   Dr.Rourk- attempted/incomplete colonoscopy. inadequate prep    COLONOSCOPY N/A 05/27/2014   YBW:[id number] coli. Colonic polyps-removed as described above.Status post segmental biopsy. single tubular adenoma and random colon bx neg   COLONOSCOPY WITH PROPOFOL N/A 12/23/2019   Procedure: COLONOSCOPY WITH PROPOFOL;  Surgeon: Ronnette Juniper, MD;  Location: [facility];  Service: Gastroenterology;  Laterality: N/A;   DILATION AND CURETTAGE OF UTERUS  1974   ESOPHAGOGASTRODUODENOSCOPY  05/2010   Dr. Tessie Fass, erosion. 55F dilation   ESOPHAGOGASTRODUODENOSCOPY N/A 04/29/2014   Dr.Rourk- normal esophagus s/p passage of maloney dilator. small hiatal hernia- bx= chronic inflammation.   ESOPHAGOGASTRODUODENOSCOPY (EGD) WITH PROPOFOL N/A 12/23/2019  Procedure: ESOPHAGOGASTRODUODENOSCOPY (EGD) WITH PROPOFOL;  Surgeon: Ronnette Juniper, MD;  Location: [facility];  Service: Gastroenterology;  Laterality: N/A;   GIVENS CAPSULE STUDY N/A 03/22/2020   Procedure: GIVENS CAPSULE STUDY;  Surgeon: Daneil Dolin, MD;  [facility];  Service: Endoscopy;  Laterality: N/A;  7:30am   HEMOSTASIS CLIP PLACEMENT  12/23/2019   Procedure: HEMOSTASIS CLIP PLACEMENT;  Surgeon: Ronnette Juniper, MD;  Location: [facility];  Service: Gastroenterology;;   HOT HEMOSTASIS N/A 12/23/2019   Procedure: HOT HEMOSTASIS (ARGON PLASMA COAGULATION/BICAP);  Surgeon: Ronnette Juniper, MD;  Location: [facility];  Service: Gastroenterology;  Laterality: N/A;   LEFT HEART CATH AND CORONARY ANGIOGRAPHY N/A 12/24/2019   Procedure: LEFT HEART CATH AND CORONARY ANGIOGRAPHY;  Surgeon: Nigel Mormon, MD;  Location: [facility];  Service: Cardiovascular;  Laterality: N/A;   MALONEY DILATION N/A 04/29/2014   Procedure: Venia Minks DILATION;  Surgeon: Daneil Dolin, MD;  [facility];  Service: Endoscopy;  Laterality: N/A;   PARTIAL HYSTERECTOMY  1978   POLYPECTOMY  12/23/2019   Procedure: POLYPECTOMY;  Surgeon: Ronnette Juniper, MD;  Location: [facility];  Service: Gastroenterology;;   SHOULDER SURGERY  Left    Left shoulder for RTC;left arm surgery '98/left hand surgery 2001   TOTAL ABDOMINAL HYSTERECTOMY W/ BILATERAL SALPINGOOPHORECTOMY  3235   UMBILICAL HERNIA REPAIR  2008   VIDEO BRONCHOSCOPY WITH ENDOBRONCHIAL ULTRASOUND N/A 05/20/2020   Procedure: VIDEO BRONCHOSCOPY WITH ENDOBRONCHIAL ULTRASOUND;  Surgeon: Collene Gobble, MD;  Location: [facility];  Service: Pulmonary;  Laterality: N/A;    REVIEW OF SYSTEMS:  A comprehensive review of systems was negative except for: Constitutional: positive for fatigue Respiratory: positive for cough and dyspnea on exertion   PHYSICAL EXAMINATION: General appearance: alert, cooperative, fatigued, and no distress Head: Normocephalic, without obvious abnormality, atraumatic Neck: no adenopathy, no JVD, supple, symmetrical, trachea midline, and thyroid not enlarged, symmetric, no tenderness/mass/nodules Lymph nodes: Cervical, supraclavicular, and axillary nodes normal. Resp: clear to auscultation bilaterally Back: symmetric, no curvature. ROM normal. No CVA tenderness. Cardio: regular rate and rhythm, S1, S2 normal, no murmur, click, rub or gallop GI: soft, non-tender; bowel sounds normal; no masses,  no organomegaly Extremities: extremities normal, atraumatic, no cyanosis or edema  ECOG PERFORMANCE STATUS: 1 - Symptomatic but completely ambulatory  Blood pressure 110/68, pulse 91, temperature 98.4 F (36.9 C), temperature source Oral, resp. rate 19, height 5' 6.5" (1.689 m), weight 131 lb 1.6 oz (59.5 kg), SpO2 91 %.  LABORATORY DATA: Lab Results  Component Value Date   WBC 5.8 06/21/2021   HGB 11.8 (L) 06/21/2021   HCT 37.6 06/21/2021   MCV 100.0 06/21/2021   PLT 218 06/21/2021      Chemistry      Component Value Date/Time   NA 140 06/21/2021 0838   NA 138 12/19/2020 1003   K 4.5 06/21/2021 0838   CL 103 06/21/2021 0838   CO2 25 06/21/2021 0838   BUN 8 06/21/2021 0838   BUN 15 12/19/2020 1003   CREATININE 1.06 (H) 06/21/2021 0838    CREATININE 0.85 05/03/2012 0942      Component Value Date/Time   CALCIUM 10.4 (H) 06/21/2021 0838   ALKPHOS 53 06/21/2021 0838   AST 13 (L) 06/21/2021 0838   ALT 11 06/21/2021 0838   BILITOT 0.4 06/21/2021 0838       RADIOGRAPHIC STUDIES: CT Chest W Contrast  Result Date: 06/21/2021 CLINICAL DATA:  Primary Cancer Type: Lung Imaging Indication: Routine surveillance Interval therapy  since last imaging? No Initial Cancer Diagnosis Date: 05/20/2020; Established by: Biopsy-proven Detailed Pathology: Extensive stage small cell lung cancer. Primary Tumor location: Right middle lobe.  Solitary brain mass. Surgeries: Cholecystectomy. Hysterectomy. Maloney dilation. Back fusion. Chemotherapy: Yes; Ongoing?  No; Most recent administration: 08/2020 Immunotherapy? No Radiation therapy? Yes Date Range: 03/02/2021 - 03/22/2021; Target: Brain Date Range: 06/28/2020 - 08/08/2020; Target: Right lung EXAM: CT CHEST, ABDOMEN, AND PELVIS WITH CONTRAST TECHNIQUE: Multidetector CT imaging of the chest, abdomen and pelvis was performed following the standard protocol during bolus administration of intravenous contrast. CONTRAST:  52m OMNIPAQUE IOHEXOL 350 MG/ML SOLN, additional oral enteric contrast COMPARISON:  Most recent CT chest 03/16/2021. 06/08/2020 CT abdomen and pelvis. 05/02/2020 PET-CT. FINDINGS: CT CHEST FINDINGS Cardiovascular: Aortic atherosclerosis. Aortic valve calcifications. Normal heart size. Three-vessel coronary artery calcifications. No pericardial effusion. Mediastinum/Nodes: Unchanged soft tissue thickening about the right hilum (series 2, image 26). No discretely enlarged mediastinal, hilar, or axillary lymph nodes. Thyroid gland, trachea, and esophagus demonstrate no significant findings. Lungs/Pleura: Mild centrilobular and paraseptal emphysema. Mild pulmonary fibrosis in a pattern without clear apical to basal gradient, featuring irregular peripheral interstitial opacity and septal thickening,  unchanged. Unchanged post treatment appearance of the mass of the lateral segment right middle lobe, measuring 2.7 x 2.0 cm (series 6, image 69). No pleural effusion or pneumothorax. Musculoskeletal: No chest wall mass or suspicious bone lesions identified. CT ABDOMEN PELVIS FINDINGS Hepatobiliary: No solid liver abnormality is seen. Coarse, nodular contour of the liver. No gallstones, gallbladder wall thickening, or biliary dilatation. Pancreas: Unremarkable. No pancreatic ductal dilatation or surrounding inflammatory changes. Spleen: Normal in size without significant abnormality. Adrenals/Urinary Tract: Adrenal glands are unremarkable. Kidneys are normal, without renal calculi, solid lesion, or hydronephrosis. Bladder is unremarkable. Stomach/Bowel: Stomach is within normal limits. Appendix appears normal. No evidence of bowel wall thickening, distention, or inflammatory changes. Vascular/Lymphatic: Aortic atherosclerosis. No enlarged abdominal or pelvic lymph nodes. Reproductive: Status post hysterectomy. Other: No abdominal wall hernia or abnormality. No abdominopelvic ascites. Musculoskeletal: No acute or significant osseous findings. IMPRESSION: 1. Unchanged post treatment appearance of the mass of the lateral segment right middle lobe. 2. Unchanged soft tissue thickening about the right hilum. No discretely enlarged mediastinal lymph nodes. 3. Mild pulmonary fibrosis in a pattern without clear apical to basal gradient, featuring irregular peripheral interstitial opacity and septal thickening, unchanged. Consider pulmonary referral and dedicated I LD protocol examination of the chest to further characterize. 4. Emphysema. 5. Coronary artery disease. 6. Aortic valve calcifications. Correlate for echocardiographic evidence of aortic valve dysfunction. 7. Coarse, nodular contour of the liver, suggestive of cirrhosis. Aortic Atherosclerosis (ICD10-I70.0) and Emphysema (ICD10-J43.9). Electronically Signed   By:  AEddie CandleM.D.   On: 06/21/2021 12:02   CT Abdomen Pelvis W Contrast  Result Date: 06/21/2021 CLINICAL DATA:  Primary Cancer Type: Lung Imaging Indication: Routine surveillance Interval therapy since last imaging? No Initial Cancer Diagnosis Date: 05/20/2020; Established by: Biopsy-proven Detailed Pathology: Extensive stage small cell lung cancer. Primary Tumor location: Right middle lobe.  Solitary brain mass. Surgeries: Cholecystectomy. Hysterectomy. Maloney dilation. Back fusion. Chemotherapy: Yes; Ongoing?  No; Most recent administration: 08/2020 Immunotherapy? No Radiation therapy? Yes Date Range: 03/02/2021 - 03/22/2021; Target: Brain Date Range: 06/28/2020 - 08/08/2020; Target: Right lung EXAM: CT CHEST, ABDOMEN, AND PELVIS WITH CONTRAST TECHNIQUE: Multidetector CT imaging of the chest, abdomen and pelvis was performed following the standard protocol during bolus administration of intravenous contrast. CONTRAST:  841mOMNIPAQUE IOHEXOL 350 MG/ML SOLN, additional oral enteric contrast COMPARISON:  Most recent CT chest 03/16/2021. 06/08/2020 CT abdomen and pelvis. 05/02/2020 PET-CT. FINDINGS: CT CHEST FINDINGS Cardiovascular: Aortic atherosclerosis. Aortic valve calcifications. Normal heart size. Three-vessel coronary artery calcifications. No pericardial effusion. Mediastinum/Nodes: Unchanged soft tissue thickening about the right hilum (series 2, image 26). No discretely enlarged mediastinal, hilar, or axillary lymph nodes. Thyroid gland, trachea, and esophagus demonstrate no significant findings. Lungs/Pleura: Mild centrilobular and paraseptal emphysema. Mild pulmonary fibrosis in a pattern without clear apical to basal gradient, featuring irregular peripheral interstitial opacity and septal thickening, unchanged. Unchanged post treatment appearance of the mass of the lateral segment right middle lobe, measuring 2.7 x 2.0 cm (series 6, image 69). No pleural effusion or pneumothorax. Musculoskeletal: No  chest wall mass or suspicious bone lesions identified. CT ABDOMEN PELVIS FINDINGS Hepatobiliary: No solid liver abnormality is seen. Coarse, nodular contour of the liver. No gallstones, gallbladder wall thickening, or biliary dilatation. Pancreas: Unremarkable. No pancreatic ductal dilatation or surrounding inflammatory changes. Spleen: Normal in size without significant abnormality. Adrenals/Urinary Tract: Adrenal glands are unremarkable. Kidneys are normal, without renal calculi, solid lesion, or hydronephrosis. Bladder is unremarkable. Stomach/Bowel: Stomach is within normal limits. Appendix appears normal. No evidence of bowel wall thickening, distention, or inflammatory changes. Vascular/Lymphatic: Aortic atherosclerosis. No enlarged abdominal or pelvic lymph nodes. Reproductive: Status post hysterectomy. Other: No abdominal wall hernia or abnormality. No abdominopelvic ascites. Musculoskeletal: No acute or significant osseous findings. IMPRESSION: 1. Unchanged post treatment appearance of the mass of the lateral segment right middle lobe. 2. Unchanged soft tissue thickening about the right hilum. No discretely enlarged mediastinal lymph nodes. 3. Mild pulmonary fibrosis in a pattern without clear apical to basal gradient, featuring irregular peripheral interstitial opacity and septal thickening, unchanged. Consider pulmonary referral and dedicated I LD protocol examination of the chest to further characterize. 4. Emphysema. 5. Coronary artery disease. 6. Aortic valve calcifications. Correlate for echocardiographic evidence of aortic valve dysfunction. 7. Coarse, nodular contour of the liver, suggestive of cirrhosis. Aortic Atherosclerosis (ICD10-I70.0) and Emphysema (ICD10-J43.9). Electronically Signed   By: Eddie Candle M.D.   On: 06/21/2021 12:02     ASSESSMENT AND PLAN: This is a very pleasant 70 years old white female recently diagnosed with extensive stage small cell lung cancer (T3, N2, M1 C)  presented with right middle lobe lung mass in addition to right hilar and low right paratracheal lymphadenopathy in addition to tiny solitary brain metastasis diagnosed in August 2021. The patient is currently undergoing systemic chemotherapy with carboplatin for AUC of 5 on day 1, etoposide 100 mg/M2 with Neulasta support.  Status post 4 cycles.   She had evidence of metastatic disease to the brain and she underwent whole brain irradiation under the care of Dr. [name]. She is currently on observation and feeling fine. She had repeat CT scan of the chest, abdomen pelvis performed recently.  I personally and independently reviewed the scans and discussed the results with the patient today. Her scan showed no concerning findings for disease progression. I recommended for the patient to continue on observation with repeat CT scan of the chest, abdomen pelvis in 4 months. For the history of COPD and suspicious interstitial lung disease, she is followed by Dr. [name]. She was advised to call immediately if she has any other concerning symptoms in the interval. The patient voices understanding of current disease status and treatment options and is in agreement with the current care plan.  All questions were answered. The patient knows to call the clinic with any problems, questions or  concerns. We can certainly see the patient much sooner if necessary.  Disclaimer: This note was dictated with voice recognition software. Similar sounding words can inadvertently be transcribed and may not be corrected upon review.

## 2021-06-23 ENCOUNTER — Ambulatory Visit (INDEPENDENT_AMBULATORY_CARE_PROVIDER_SITE_OTHER): Payer: Medicare HMO | Admitting: Pulmonary Disease

## 2021-06-23 ENCOUNTER — Telehealth: Payer: Self-pay | Admitting: Pulmonary Disease

## 2021-06-23 DIAGNOSIS — R0602 Shortness of breath: Secondary | ICD-10-CM

## 2021-06-23 DIAGNOSIS — J410 Simple chronic bronchitis: Secondary | ICD-10-CM | POA: Diagnosis not present

## 2021-06-23 DIAGNOSIS — C342 Malignant neoplasm of middle lobe, bronchus or lung: Secondary | ICD-10-CM

## 2021-06-23 NOTE — Telephone Encounter (Signed)
Please arrange for 2 liters oxygen with exertion and sleep.

## 2021-06-23 NOTE — Telephone Encounter (Addendum)
Patient came into the office to pick up samples of inhaler. She mentioned that she wanted to see if she could do qualifying walk for oxygen. Advised patient that I could do it today if she had time. Walked patient in the office and she qualified for oxygen. She states that she has been having trouble with her oxygen since doing treatments for cancer. Will route to Dr. [name] to make him aware and see if he would like me to order oxygen for patient and if he would also like to order ONO on patient since she has been complaining of being tired all the time.  Dr. [name] please advise

## 2021-06-26 NOTE — Addendum Note (Signed)
Addended by: Elby Beck R on: 06/26/2021 11:19 AM   Modules accepted: Orders

## 2021-06-26 NOTE — Telephone Encounter (Signed)
Order has been placed. Patient is aware. Nothing further needed at this time.

## 2021-06-29 DIAGNOSIS — J209 Acute bronchitis, unspecified: Secondary | ICD-10-CM | POA: Diagnosis not present

## 2021-07-04 ENCOUNTER — Telehealth: Payer: Self-pay | Admitting: Pulmonary Disease

## 2021-07-05 DIAGNOSIS — H6123 Impacted cerumen, bilateral: Secondary | ICD-10-CM | POA: Diagnosis not present

## 2021-07-05 DIAGNOSIS — H900 Conductive hearing loss, bilateral: Secondary | ICD-10-CM | POA: Diagnosis not present

## 2021-07-05 NOTE — Telephone Encounter (Signed)
Pt stated she was told to call back in a few weeks about doing another walk.  Came in a few weeks ago and did a walk.  Patient has another appt this afternoon and will leave at 2 if she needed to come back.  Please advise.  [phone].

## 2021-07-05 NOTE — Progress Notes (Signed)
Reviewed recent CT chest/abd/pelvis: stable cirrhosis, no hepatoma. Planned as previously outlined.

## 2021-07-05 NOTE — Telephone Encounter (Signed)
ATC patient LVM advising pt to call us back to discuss

## 2021-07-05 NOTE — Telephone Encounter (Signed)
Patient was calling to make sure she doesn't need a second walk. I assured her that the walk she did a few weeks ago was enough to qualify her for oxygen. She voiced understanding. Nothing needed at this time.

## 2021-07-07 DIAGNOSIS — Z23 Encounter for immunization: Secondary | ICD-10-CM | POA: Diagnosis not present

## 2021-07-11 DIAGNOSIS — E119 Type 2 diabetes mellitus without complications: Secondary | ICD-10-CM | POA: Diagnosis not present

## 2021-07-14 DIAGNOSIS — H6123 Impacted cerumen, bilateral: Secondary | ICD-10-CM | POA: Diagnosis not present

## 2021-07-14 DIAGNOSIS — H9203 Otalgia, bilateral: Secondary | ICD-10-CM | POA: Diagnosis not present

## 2021-07-14 DIAGNOSIS — H900 Conductive hearing loss, bilateral: Secondary | ICD-10-CM | POA: Diagnosis not present

## 2021-07-17 DIAGNOSIS — F339 Major depressive disorder, recurrent, unspecified: Secondary | ICD-10-CM | POA: Diagnosis not present

## 2021-07-17 DIAGNOSIS — F411 Generalized anxiety disorder: Secondary | ICD-10-CM | POA: Diagnosis not present

## 2021-07-18 ENCOUNTER — Other Ambulatory Visit: Payer: Self-pay | Admitting: Otolaryngology

## 2021-07-19 DIAGNOSIS — I10 Essential (primary) hypertension: Secondary | ICD-10-CM | POA: Diagnosis not present

## 2021-07-19 DIAGNOSIS — H6123 Impacted cerumen, bilateral: Secondary | ICD-10-CM | POA: Diagnosis not present

## 2021-07-19 DIAGNOSIS — R251 Tremor, unspecified: Secondary | ICD-10-CM | POA: Diagnosis not present

## 2021-07-20 ENCOUNTER — Encounter: Payer: Self-pay | Admitting: Neurology

## 2021-07-25 ENCOUNTER — Other Ambulatory Visit: Payer: Self-pay | Admitting: Gastroenterology

## 2021-07-25 ENCOUNTER — Telehealth: Payer: Self-pay | Admitting: *Deleted

## 2021-07-25 NOTE — Telephone Encounter (Signed)
CALLED PATIENT TO INFORM OF MRI FOR 08-08-21- ARRIVAL TIME- 9:30 AM @ [facility], NO RESTRICTIONS TO TEST, PATIENT TO FU WITH DR. KINARD FOR RESULTS ON 08-10-21 @ 11:30 AM, LVM FOR A RETURN CALL

## 2021-07-26 DIAGNOSIS — H6123 Impacted cerumen, bilateral: Secondary | ICD-10-CM | POA: Diagnosis not present

## 2021-07-26 DIAGNOSIS — H6121 Impacted cerumen, right ear: Secondary | ICD-10-CM | POA: Diagnosis not present

## 2021-07-27 ENCOUNTER — Encounter (HOSPITAL_COMMUNITY): Payer: Self-pay | Admitting: Anesthesiology

## 2021-07-27 NOTE — Progress Notes (Signed)
Reviewed pt's upcoming procedure 08/04/21 with Dr Sabra Heck. Surgery will need to be done at [facility]. Spoke with Nira Conn at Dr Deeann Saint office and let her know that the case will need to be moved.

## 2021-07-29 DIAGNOSIS — J209 Acute bronchitis, unspecified: Secondary | ICD-10-CM | POA: Diagnosis not present

## 2021-07-31 DIAGNOSIS — H6121 Impacted cerumen, right ear: Secondary | ICD-10-CM | POA: Diagnosis not present

## 2021-08-04 ENCOUNTER — Ambulatory Visit (HOSPITAL_BASED_OUTPATIENT_CLINIC_OR_DEPARTMENT_OTHER): Admission: RE | Admit: 2021-08-04 | Payer: Medicare HMO | Source: Home / Self Care | Admitting: Otolaryngology

## 2021-08-04 ENCOUNTER — Encounter (HOSPITAL_BASED_OUTPATIENT_CLINIC_OR_DEPARTMENT_OTHER): Admission: RE | Payer: Self-pay | Source: Home / Self Care

## 2021-08-04 SURGERY — EXAM UNDER ANESTHESIA
Anesthesia: General

## 2021-08-08 ENCOUNTER — Ambulatory Visit (HOSPITAL_COMMUNITY)
Admission: RE | Admit: 2021-08-08 | Discharge: 2021-08-08 | Disposition: A | Discharge status: Home / Self Care | Payer: Medicare HMO | Source: Ambulatory Visit | Attending: Radiation Oncology | Admitting: Radiation Oncology

## 2021-08-08 ENCOUNTER — Other Ambulatory Visit: Payer: Self-pay

## 2021-08-08 DIAGNOSIS — G9389 Other specified disorders of brain: Secondary | ICD-10-CM | POA: Diagnosis not present

## 2021-08-08 DIAGNOSIS — C7931 Secondary malignant neoplasm of brain: Secondary | ICD-10-CM | POA: Insufficient documentation

## 2021-08-08 MED ORDER — GADOBUTROL 1 MMOL/ML IV SOLN
6.0000 mL | Freq: Once | INTRAVENOUS | Status: AC | PRN
  Administered 2021-08-08: 6 mL via INTRAVENOUS

## 2021-08-09 ENCOUNTER — Encounter: Payer: Self-pay | Admitting: Internal Medicine

## 2021-08-09 NOTE — Progress Notes (Signed)
Assessment/Plan:   1.  Essential Tremor.  -This is evidenced by the symmetrical nature and longstanding hx of gradually getting worse.  She actually saw a neurologist for this back in 1984.  We discussed nature and pathophysiology.  We discussed that this can continue to gradually get worse with time.  We discussed that some medications can worsen this, as can caffeine use.  We discussed medication therapy as well as surgical therapy.  Ultimately, the patient decided to hold off on taking any medication.  She feels that she is on plenty of other medications.  She also has a history of NASH, making medication difficult/complex.    -She and I discussed that oral medications generally do not treat head tremor very well.  Sometimes, Botox can be used to help this.  Again, this is not that bothersome to her and she decided to hold off on that for now.  -Patient does have history of kidney stones and we would not be able to use topiramate.  2.  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  Left cerebellar lesion actually is much improved  -Do not think that this at all contributes to her tremor  3.  Gait instability  -Her friend asks if there is anything that can help this.  We discussed physical therapy.  Declined physical therapy for now.  4.  Vertigo  -following with ENT, Dr. Benjamine Mola  -Patient's friend has followed orthostatics, which have been negative.  They asked me about this today.  Discussed that cerebellar lesions can sometimes produce vertigo, but they should discuss this more with oncology.  Discussed that this is really out of my area of expertise as a movement disorder physician.  They did note that they may be doing vestibular rehab soon.  Subjective:   [name] was seen today in the movement disorders clinic for neurologic consultation at the request of Valentino Nose, FNP.  The consultation is for the evaluation of  tremor.  Pt with friend who supplements hx.  Patient is a 70 year old female with a history of metastatic small cell lung cancer to brain (status post whole brain radiation, completed May, 2022) and NASH who presents today with complaints of tremor.  Medical records made available to me are reviewed.  Friend with her reports that pt had exposure to lots of herbicides/chemicals.  Friend also reports that they used to burn trash/plastics in the house.    Tremor: Yes.     How long has it been going on? Since 1984 - saw a neurologist in [place] at dx and was told it could be overusage - no medication was tried  At rest or with activation?  both  When is it noted the most?  "When I do anything"  Fam hx of tremor?  No. (Dad had tremor but they attributed to EtOH)  Located where?  Hand, arm, torso and head over the last 2 years, legs  Affected by caffeine:  No.   Affected by alcohol:  Yes.   (drinks 1-2 times per year and would help that)  Affected by stress:  Yes.    Affected by fatigue:  No.  Spills soup if on spoon:  Yes.    Affects ADL's (tying shoes, brushing teeth, etc):  Yes.   (Hand shakes but able to do it)  Tremor inducing meds:  Yes.   Albuterol; trelegy (doesn't think it changes tremor)   MRI brain  with and without gadolinium was just completed on August 08, 2021.  The area of metastatic disease in the left cerebellum is very small, and decreased compared to prior images.  There was at least moderate small vessel disease.  I personally reviewed those images.    ALLERGIES:   Allergies  Allergen Reactions   Bee Venom Shortness Of Breath and Swelling   Iohexol Shortness Of Breath and Other (See Comments)    CHEST TIGHTNESS and BREATHING PROBLEMS- NEEDS PRE-MEDS 13 hours beforehand-  premeds given 06/08/2020 without incident    Tape Other (See Comments)    Tears the skin!!   Betadine [Povidone Iodine] Rash   Ciprofloxacin Rash   Latex Rash and Other (See Comments)    NO latex  gloves!!    Penicillins Swelling    Did it involve swelling of the face/tongue/throat, SOB, or low BP? Yes Did it involve sudden or severe rash/hives, skin peeling, or any reaction on the inside of your mouth or nose? No Did you need to seek medical attention at a hospital or doctor's office? Yes When did it last happen?      1996 If all above answers are "NO", may proceed with cephalosporin use.   Povidone-Iodine Rash    CURRENT MEDICATIONS:  Current Outpatient Medications  Medication Instructions   albuterol (VENTOLIN HFA) 108 (90 Base) MCG/ACT inhaler 2 puffs, Inhalation, Every 4 hours PRN   aspirin-acetaminophen-caffeine (EXCEDRIN MIGRAINE) 250-250-65 MG tablet 2 tablets, Oral, Daily PRN, Up to 4 times a week    B Complex-C (B-COMPLEX WITH VITAMIN C) tablet 1 tablet, Oral, Daily   Benzocaine (BOIL-EASE EX) 1 application, Topical, Daily PRN   carbamide peroxide (DEBROX) 6.5 % OTIC solution 5 drops, Both EARS, Daily PRN   colestipol (COLESTID) 3 g, Oral, Daily, Do not take within 2 hours of other medications.   diazepam (VALIUM) 2.5 mg, Oral, Daily at bedtime, 1/2 tab qhs   diclofenac Sodium (VOLTAREN) 1 % GEL Topical, As needed   empagliflozin (JARDIANCE) 10 MG TABS tablet Oral, Daily   Ferrous Sulfate (IRON) 325 (65 Fe) MG TABS 1 tablet, Oral, Daily   Fluticasone-Umeclidin-Vilant (TRELEGY ELLIPTA) 100-62.5-25 MCG/INH AEPB 1 puff, Inhalation, Daily   folic acid (FOLVITE) 1 mg, Oral, Daily   furosemide (LASIX) 40 mg, Oral, Daily   guaiFENesin-dextromethorphan (ROBITUSSIN DM) 100-10 MG/5ML syrup 5 mLs, Oral, Every 4 hours PRN   hydrocortisone cream 1 % 1 application, Topical, Daily PRN   Hydrocortisone, Perianal, (PROCTO-PAK) 1 % CREA Apply anorectally twice daily for two weeks.   levocetirizine (XYZAL) 5 mg, Oral, Nightly   levothyroxine (SYNTHROID) 112 mcg, Oral, Daily   metFORMIN (GLUCOPHAGE-XR) 1,000 mg, Oral, 2 times daily   Multiple Vitamins-Minerals (MULTIVITAMIN WITH  MINERALS) tablet 2 tablets, Oral, Daily, Adult 50+   neomycin-bacitracin-polymyxin (NEOSPORIN) OINT 1 application, Topical, Daily PRN   oxyCODONE-acetaminophen (PERCOCET/ROXICET) 5-325 MG tablet 0.5 tablets, Oral, 2 times daily PRN   pantoprazole (PROTONIX) 40 mg, Oral, Daily   PARoxetine (PAXIL) 20 mg, Oral, Daily   rosuvastatin (CRESTOR) 20 mg, Oral, Daily PRN   traZODone (DESYREL) 75 mg, Oral, Daily at bedtime   Vitamin D3 8,000 Units, Oral, Daily   zinc sulfate 220 mg, Oral, Daily    Objective:   PHYSICAL EXAMINATION:    VITALS:   Vitals:   08/11/21 1318  BP: 130/68  Pulse: 86  Resp: 18  SpO2: 90%  Weight: 129 lb (58.5 kg)  Height: 5' 6"  (1.676 m)    GEN:  The patient  appears stated age and is in NAD. HEENT:  Normocephalic, atraumatic.  The mucous membranes are moist. The superficial temporal arteries are without ropiness or tenderness. CV:  RRR Lungs:  CTAB Neck/HEME:  There are no carotid bruits bilaterally.  Neurological examination:  Orientation: The patient is alert and oriented x3.  Cranial nerves: There is good facial symmetry.  Extraocular muscles are intact. The visual fields are full to confrontational testing. The speech is fluent and clear. Soft palate rises symmetrically and there is no tongue deviation. Hearing is intact to conversational tone. Sensation: Sensation is intact to light touch throughout (facial, trunk, extremities). There is no extinction with double simultaneous stimulation.  Motor: Strength is 5/5 in the bilateral upper and lower extremities. She has no hand on the R due to prior trauma.    Shoulder shrug is equal and symmetric.  There is no pronator drift.   Movement examination: Tone: There is nomral tone in the bilateral upper extremities.  The tone in the lower extremities is normal.  Abnormal movements: there is head tremor mostly in the "no" direction, but occasionally it was more complex.  She has tremor of the outstretched arms,  overall mild.  She has some trouble with Archimedes spirals on the left (not able to do with the right because she has no hand on the right).  She has trouble pouring water from 1 glass to another because of tremor, but also because of the logistics associated with the fact that she has no hand on the right. Coordination:  There is no decremation with RAM's bilaterally in the UE/LE.   Gait and Station: The patient has no difficulty arising out of a deep-seated chair without the use of the hands. The patient's stride length is good with mild trouble in the turns.   I have reviewed and interpreted the following labs independently   Chemistry      Component Value Date/Time   NA 140 06/21/2021 0838   NA 138 12/19/2020 1003   K 4.5 06/21/2021 0838   CL 103 06/21/2021 0838   CO2 25 06/21/2021 0838   BUN 8 06/21/2021 0838   BUN 15 12/19/2020 1003   CREATININE 1.06 (H) 06/21/2021 0838   CREATININE 0.85 05/03/2012 0942      Component Value Date/Time   CALCIUM 10.4 (H) 06/21/2021 0838   ALKPHOS 53 06/21/2021 0838   AST 13 (L) 06/21/2021 0838   ALT 11 06/21/2021 0838   BILITOT 0.4 06/21/2021 0838      Lab Results  Component Value Date   TSH 0.063 (L) 05/25/2020   Lab Results  Component Value Date   WBC 5.8 06/21/2021   HGB 11.8 (L) 06/21/2021   HCT 37.6 06/21/2021   MCV 100.0 06/21/2021   PLT 218 06/21/2021      Total time spent on today's visit was 41mnutes, including both face-to-face time and nonface-to-face time.  Time included that spent on review of records (prior notes available to me/labs/imaging if pertinent), discussing treatment and goals, answering patient's questions and coordinating care.  Cc:  HCelene Squibb MD

## 2021-08-09 NOTE — Progress Notes (Signed)
Radiation Oncology         [phone] ________________________________  Name: [name] MRN: [id number]  Date: 08/10/2021  DOB: 1951-04-05  Follow-Up Visit Note  CC: [name], MD  [name], MD    ICD-10-CM   1. Small cell lung cancer, right middle lobe (HCC)  C34.2     2. Brain metastases ([place])  C79.31 MR Brain W Wo Contrast      Diagnosis: Extensive stage (T3, N2, M1c) small cell carcinoma of the right middle lung with right hilar and low right paratracheal lymphadenopathy, now with cerebellar metastasis   Interval Since Last Radiation: 4 month and 19 days   Intent: Palliative  Radiation Treatment Dates: 03/02/2021 through 03/22/2021 Site Technique Total Dose (Gy) Dose per Fx (Gy) Completed Fx Beam Energies  Brain: Brain Complex 35/35 2.5 14/14 6X    Radiation Treatment Dates: 06/28/2020 through 08/08/2020 Site Technique Total Dose (Gy) Dose per Fx (Gy) Completed Fx Beam Energies  Lung, Right: Lung_Rt 3D 60/60 2 30/30 6X, 15X    Narrative:  The patient returns today for routine follow-up, she was last seen here for follow-up on 05/08/21. Since her last visit, the patient followed up with Dr. [name] on 06/22/21. During which time, the patient reported no concerning complaints except for occasional shortness of breath with exertion and continued alopecia after her brain radiation.  She denied having any current chest pain but reported mild cough with no hemoptysis.  She denied having any nausea, vomiting, diarrhea, constipation, headache, visual changes, weight loss, or night sweats.                Pertinent imaging since the patient was last seen includes:     --CT of the chest abdomen and pelvis with contrast on 06/21/21 which demonstrated the unchanged post-treatment appearance of the lateral right middle lobe mass, and soft tissue thickening about the right hilium. No discretely enlarged mediastinal lymph nodes were appreciated. Also seen to be unchanged was  mild pulmonary fibrosis (in a pattern without clear apical to basal gradient), featuring irregular peripheral interstitial opacity and septal thickening.  -- MRI of the head with and without contrast on 08/08/21 demonstrated a further interval decrease in size of the metastatic left cerebellar hemisphere lesion, measuring 6 mm. No new lesions were appreciated.         The patient does report some issues with dizziness.  She also has noticed her short-term memory to be off.  She has had some earwax issues recently and is seeing that ear nose and throat physician concerning this issue.  She denies any headaches or visual issues.       Allergies:  is allergic to bee venom, iohexol, tape, betadine [povidone iodine], ciprofloxacin, latex, penicillins, and povidone-iodine.  Meds: Current Outpatient Medications  Medication Sig Dispense Refill   albuterol (VENTOLIN HFA) 108 (90 Base) MCG/ACT inhaler Inhale 2 puffs into the lungs every 4 (four) hours as needed for wheezing or shortness of breath. 1 each 3   aspirin-acetaminophen-caffeine (EXCEDRIN MIGRAINE) 250-250-65 MG tablet Take 2 tablets by mouth daily as needed for headache. Up to 4 times a week     B Complex-C (B-COMPLEX WITH VITAMIN C) tablet Take 1 tablet by mouth daily.     Benzocaine (BOIL-EASE EX) Apply 1 application topically daily as needed (Boil).     carbamide peroxide (DEBROX) 6.5 % OTIC solution Place 5 drops into both ears daily as needed (ears).     Cholecalciferol (VITAMIN  D3) 50 MCG (2000 UT) TABS Take 8,000 Units by mouth daily.     colestipol (COLESTID) 1 g tablet Take 3 tablets (3 g total) by mouth daily. Do not take within 2 hours of other medications. 90 tablet 5   diazepam (VALIUM) 5 MG tablet Take 2.5 mg by mouth at bedtime. 1/2 tab qhs     diclofenac Sodium (VOLTAREN) 1 % GEL Apply topically as needed.     empagliflozin (JARDIANCE) 10 MG TABS tablet Take by mouth daily.     Fluticasone-Umeclidin-Vilant (TRELEGY ELLIPTA)  100-62.5-25 MCG/INH AEPB Inhale 1 puff into the lungs daily. 28 each 0   furosemide (LASIX) 40 MG tablet Take 1 tablet (40 mg total) by mouth daily. 30 tablet 1   guaiFENesin-dextromethorphan (ROBITUSSIN DM) 100-10 MG/5ML syrup Take 5 mLs by mouth every 4 (four) hours as needed for cough.     hydrocortisone cream 1 % Apply 1 application topically daily as needed for itching.     Hydrocortisone, Perianal, (PROCTO-PAK) 1 % CREA Apply anorectally twice daily for two weeks. 28 g 0   levothyroxine (SYNTHROID) 112 MCG tablet Take 112 mcg by mouth daily.     metFORMIN (GLUCOPHAGE-XR) 500 MG 24 hr tablet Take 1,000 mg by mouth 2 (two) times daily.     Multiple Vitamins-Minerals (MULTIVITAMIN WITH MINERALS) tablet Take 2 tablets by mouth daily. Adult 50+     neomycin-bacitracin-polymyxin (NEOSPORIN) OINT Apply 1 application topically daily as needed for irritation or wound care.     oxyCODONE-acetaminophen (PERCOCET/ROXICET) 5-325 MG tablet Take 0.5 tablets by mouth 2 (two) times daily as needed for severe pain.     pantoprazole (PROTONIX) 40 MG tablet Take 40 mg by mouth daily.     PARoxetine (PAXIL) 20 MG tablet Take 20 mg by mouth daily.     rosuvastatin (CRESTOR) 20 MG tablet Take 20 mg by mouth daily as needed.     traZODone (DESYREL) 150 MG tablet Take 75 mg by mouth at bedtime.     zinc sulfate 220 (50 Zn) MG capsule Take 220 mg by mouth daily.     Ferrous Sulfate (IRON) 325 (65 Fe) MG TABS Take 1 tablet by mouth daily.     folic acid (FOLVITE) 1 MG tablet Take 1 mg by mouth daily. (Patient not taking: Reported on 08/10/2021)     levocetirizine (XYZAL) 5 MG tablet Take 5 mg by mouth at bedtime. (Patient not taking: Reported on 08/10/2021)     No current facility-administered medications for this encounter.    Physical Findings: The patient is in no acute distress. Patient is alert and oriented.  height is 5' 6.5" (1.689 m) and weight is 130 lb (59 kg). Her temperature is 97.5 F (36.4 C)  (abnormal). Her blood pressure is 115/61 and her pulse is 87. Her respiration is 20 and oxygen saturation is 95%. .  No significant changes. Lungs are clear to auscultation bilaterally. Heart has regular rate and rhythm. No palpable cervical, supraclavicular, or axillary adenopathy. Abdomen soft, non-tender, normal bowel sounds.    Lab Findings: Lab Results  Component Value Date   WBC 5.8 06/21/2021   HGB 11.8 (L) 06/21/2021   HCT 37.6 06/21/2021   MCV 100.0 06/21/2021   PLT 218 06/21/2021    Radiographic Findings: MR Brain W Wo Contrast  Result Date: 08/09/2021 CLINICAL DATA:  Small cell lung cancer, staging. EXAM: MRI HEAD WITHOUT AND WITH CONTRAST TECHNIQUE: Multiplanar, multiecho pulse sequences of the brain and surrounding structures were obtained without  and with intravenous contrast. CONTRAST:  46m GADAVIST GADOBUTROL 1 MMOL/ML IV SOLN COMPARISON:  MRI of the brain May 04, 2021. FINDINGS: Brain: No acute infarction, hemorrhage, hydrocephalus or extra-axial collection. Further and interval decrease in size of the left cerebellar hemisphere enhancing lesion, now measuring 6 mm compared to 9 mm on prior. No new focus of abnormal contrast enhancement identified. Scattered foci of T2 hyperintensity are seen within the white matter of the cerebral hemispheres, stable. Vascular: Normal flow voids. Skull and upper cervical spine: Normal marrow signal. Sinuses/Orbits: Bilateral lens surgery. Paranasal sinuses are clear. Other: Bilateral mastoid effusion. IMPRESSION: 1. Further interval decrease in size of the metastatic left cerebellar hemisphere lesion, now measuring 6 mm. 2. No new lesion identified. Electronically Signed   By: KPedro EarlsM.D.   On: 08/09/2021 17:15    Impression:  Extensive stage (T3, N2, M1c) small cell carcinoma of the right middle lung with right hilar and low right paratracheal lymphadenopathy, now with cerebellar metastasis   The patient is recovering  from the effects of radiation.  She reports some occasional problems with dizziness and short-term memory issues since her whole brain radiation therapy.  She is quite pleased today with results of her recent MRI.  She will be following up with a neurologist Dr. TCarles Colletin the near future.  Plan: Patient will continue close follow-up concerning her systemic issues with small cell lung cancer with Dr. MJulien Nordmann  She will return to radiation oncology in 3 months after her upcoming brain MRI.   20 minutes of total time was spent for this patient encounter, including preparation, face-to-face counseling with the patient and coordination of care, physical exam, and documentation of the encounter. ____________________________________  JBlair Promise PhD, MD   This document serves as a record of services personally performed by JGery Pray MD. It was created on his behalf by ERoney Mans a trained medical scribe. The creation of this record is based on the scribe's personal observations and the provider's statements to them. This document has been checked and approved by the attending provider.

## 2021-08-10 ENCOUNTER — Other Ambulatory Visit: Payer: Self-pay

## 2021-08-10 ENCOUNTER — Ambulatory Visit
Admission: RE | Admit: 2021-08-10 | Discharge: 2021-08-10 | Disposition: A | Discharge status: Home / Self Care | Payer: Medicare HMO | Source: Ambulatory Visit | Attending: Radiation Oncology | Admitting: Radiation Oncology

## 2021-08-10 VITALS — BP 115/61 | HR 87 | Temp 97.5°F | Resp 20 | Ht 66.5 in | Wt 130.0 lb

## 2021-08-10 DIAGNOSIS — C342 Malignant neoplasm of middle lobe, bronchus or lung: Secondary | ICD-10-CM | POA: Diagnosis not present

## 2021-08-10 DIAGNOSIS — R0602 Shortness of breath: Secondary | ICD-10-CM | POA: Diagnosis not present

## 2021-08-10 DIAGNOSIS — Z79899 Other long term (current) drug therapy: Secondary | ICD-10-CM | POA: Insufficient documentation

## 2021-08-10 DIAGNOSIS — C7931 Secondary malignant neoplasm of brain: Secondary | ICD-10-CM | POA: Diagnosis not present

## 2021-08-10 DIAGNOSIS — Z923 Personal history of irradiation: Secondary | ICD-10-CM | POA: Diagnosis not present

## 2021-08-10 DIAGNOSIS — Z08 Encounter for follow-up examination after completed treatment for malignant neoplasm: Secondary | ICD-10-CM | POA: Diagnosis not present

## 2021-08-10 DIAGNOSIS — L659 Nonscarring hair loss, unspecified: Secondary | ICD-10-CM | POA: Insufficient documentation

## 2021-08-10 NOTE — Progress Notes (Signed)
[name] is here today for follow up post radiation to the lung.  Lung Side: Right, completed treatment on 08/08/20  Does the patient complain of any of the following: Pain:no Shortness of breath w/wo exertion: yes, patient currently using oxygen at 2 liters when at home.  Cough: Reports having productive cough.  Hemoptysis: no Pain with swallowing: no Swallowing/choking concerns: no Appetite: Fair Energy Level: Patient reports having a good energy.  Post radiation skin Changes: skin intact Wt Readings from Last 3 Encounters:  08/10/21 130 lb (59 kg)  06/22/21 131 lb 1.6 oz (59.5 kg)  06/20/21 131 lb 9.6 oz (59.7 kg)     Patient completed radiation treatment to Brain on 03/22/21. Patient reports having increased dizziness with position changes. Patient to follow up with neurology on 10/28/2. (Dr. Carles Collet)  Patient reports having occasional headache. Reports mild auditory changes to right ear.  Reports change to short term memory.   Patient reports receiving covid booster, flu and hep A &B vaccines recently.   Additional comments if applicable:  Vitals:   35/32/99 1147  BP: 115/61  Pulse: 87  Resp: 20  Temp: (!) 97.5 F (36.4 C)  SpO2: 95%  Weight: 130 lb (59 kg)  Height: 5' 6.5" (1.689 m)

## 2021-08-11 ENCOUNTER — Encounter: Payer: Self-pay | Admitting: Neurology

## 2021-08-11 ENCOUNTER — Ambulatory Visit (INDEPENDENT_AMBULATORY_CARE_PROVIDER_SITE_OTHER): Payer: Medicare HMO | Admitting: Neurology

## 2021-08-11 VITALS — BP 130/68 | HR 86 | Resp 18 | Ht 66.0 in | Wt 129.0 lb

## 2021-08-11 DIAGNOSIS — K7581 Nonalcoholic steatohepatitis (NASH): Secondary | ICD-10-CM | POA: Diagnosis not present

## 2021-08-11 DIAGNOSIS — R42 Dizziness and giddiness: Secondary | ICD-10-CM

## 2021-08-11 DIAGNOSIS — C7931 Secondary malignant neoplasm of brain: Secondary | ICD-10-CM | POA: Diagnosis not present

## 2021-08-11 DIAGNOSIS — G250 Essential tremor: Secondary | ICD-10-CM | POA: Diagnosis not present

## 2021-08-11 NOTE — Patient Instructions (Signed)
Essential Tremor A tremor is trembling or shaking that a person cannot control. Most tremors affect the hands or arms. Tremors can also affect the head, vocal cords, legs, and other parts of the body. Essential tremor is a tremor without a known cause. Usually, it occurs while a person is trying to perform an action. It tends to get worse gradually as a person ages. What are the causes? The cause of this condition is not known. What increases the risk? You are more likely to develop this condition if: You have a family member with essential tremor. You are age 70 or older. You take certain medicines. What are the signs or symptoms? The main sign of a tremor is a rhythmic shaking of certain parts of your body that is uncontrolled and unintentional. You may: Have difficulty eating with a spoon or fork. Have difficulty writing. Nod your head up and down or side to side. Have a quivering voice. The shaking may: Get worse over time. Come and go. Be more noticeable on one side of your body. Get worse due to stress, fatigue, caffeine, and extreme heat or cold. How is this diagnosed? This condition may be diagnosed based on: Your symptoms and medical history. A physical exam. There is no single test to diagnose an essential tremor. However, your health care provider may order tests to rule out other causes of your condition. These may include: Blood and urine tests. Imaging studies of your brain, such as CT scan and MRI. A test that measures involuntary muscle movement (electromyogram). How is this treated? Treatment for essential tremor depends on the severity of the condition. Some tremors may go away without treatment. Mild tremors may not need treatment if they do not affect your day-to-day life. Severe tremors may need to be treated using one or more of the following options: Medicines. Lifestyle changes. Occupational or physical therapy. Follow these instructions at  home: Lifestyle  Do not use any products that contain nicotine or tobacco, such as cigarettes and e-cigarettes. If you need help quitting, ask your health care provider. Limit your caffeine intake as told by your health care provider. Try to get 8 hours of sleep each night. Find ways to manage your stress that fits your lifestyle and personality. Consider trying meditation or yoga. Try to anticipate stressful situations and allow extra time to manage them. If you are struggling emotionally with the effects of your tremor, consider working with a mental health provider. General instructions Take over-the-counter and prescription medicines only as told by your health care provider. Avoid extreme heat and extreme cold. Keep all follow-up visits as told by your health care provider. This is important. Visits may include physical therapy visits. Contact a health care provider if: You experience any changes in the location or intensity of your tremors. You start having a tremor after starting a new medicine. You have tremor with other symptoms, such as: Numbness. Tingling. Pain. Weakness. Your tremor gets worse. Your tremor interferes with your daily life. You feel down, blue, or sad for at least 2 weeks in a row. Worrying about your tremor and what other people think about you interferes with your everyday life functions, including relationships, work, or school. Summary Essential tremor is a tremor without a known cause. Usually, it occurs when you are trying to perform an action. You are more likely to develop this condition if you have a family member with essential tremor. The main sign of a tremor is a rhythmic shaking of  certain parts of your body that is uncontrolled and unintentional. Treatment for essential tremor depends on the severity of the condition. This information is not intended to replace advice given to you by your health care provider. Make sure you discuss any questions  you have with your health care provider. Document Revised: 06/24/2020 Document Reviewed: 06/24/2020 Elsevier Patient Education  2022 [employer].

## 2021-08-17 IMAGING — CT CT CHEST W/ CM
2 of 4 series · 15 of 36 positions shown, 18 images · IV contrast (OMNIPAQUE)
Comparison: CT chest 09/03/2019

CLINICAL DATA: Lung cancer. Assess response to treatment is. Small
cell lung cancer with brain metastasis

EXAM:
CT CHEST WITH CONTRAST
TECHNIQUE: Multidetector CT imaging of the chest was performed during
intravenous contrast administration.
CONTRAST:  60mL OMNIPAQUE IOHEXOL 300 MG/ML  SOLN

[Series 2: axial st · axial · 0.77mm/px · z∈[-344,-88]mm · 12 of 152 slices shown, 15 images]
[im 12/152  mediastinal]
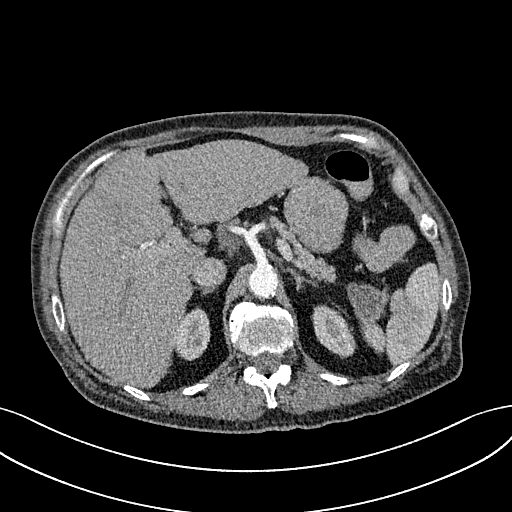
[im 12/152  lung]
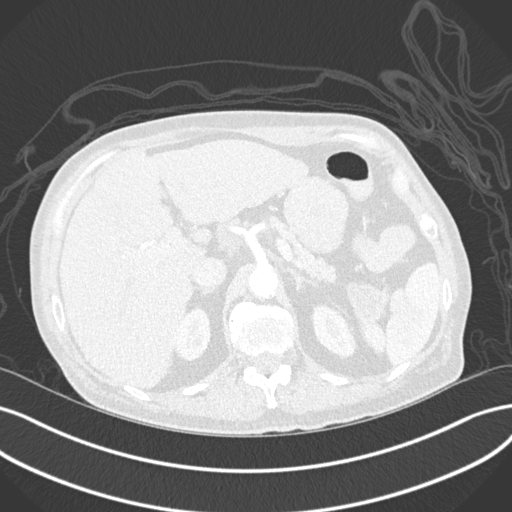
[im 24/152  lung]
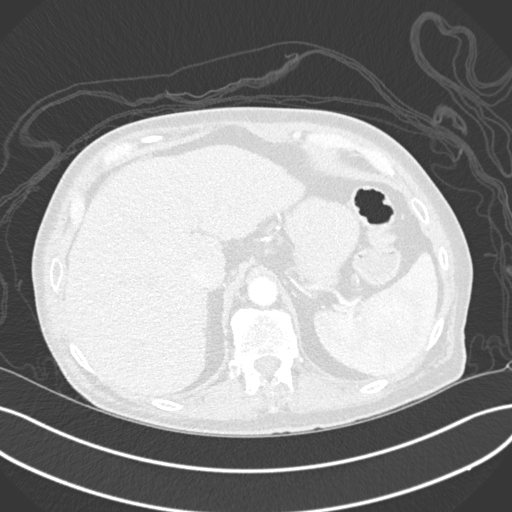
[im 35/152  lung]
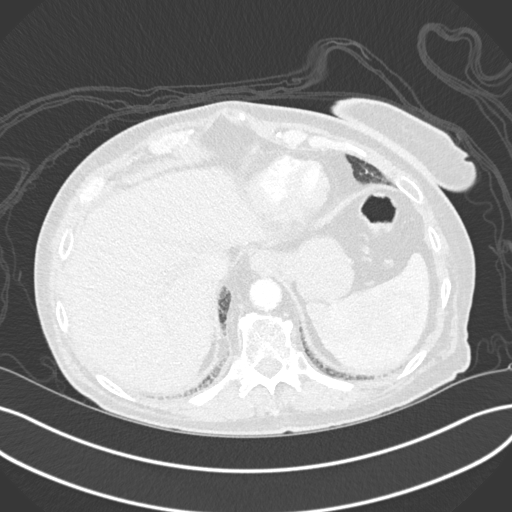
[im 47/152  lung]
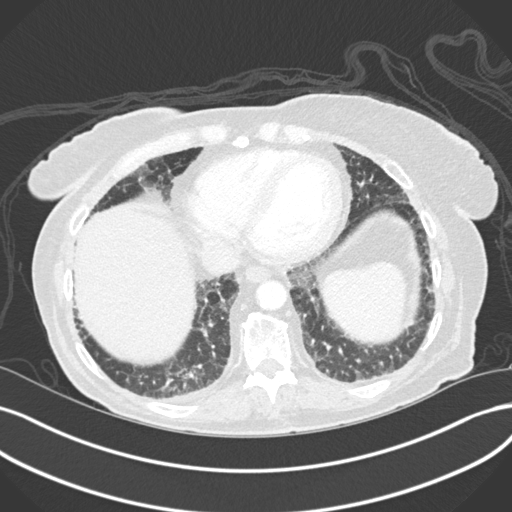
[im 59/152  mediastinal]
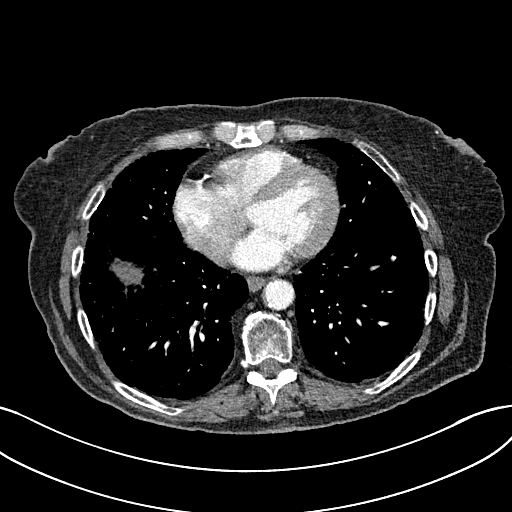
[im 59/152  lung]
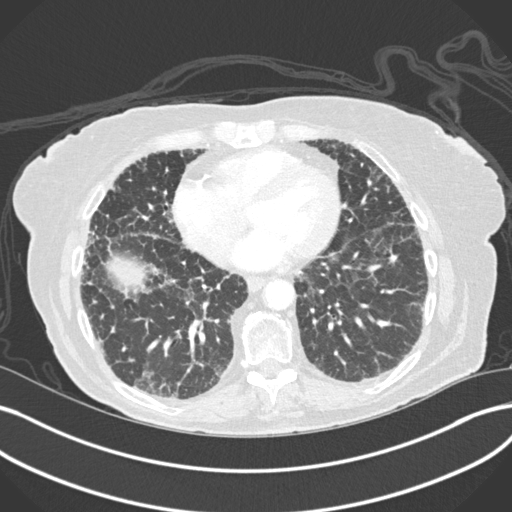
[im 70/152  lung]
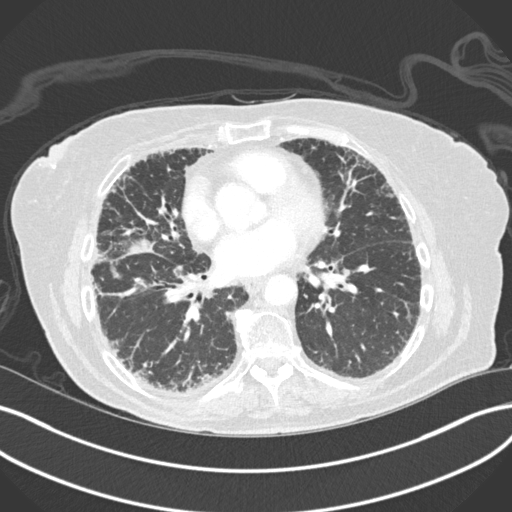
[im 82/152  lung]
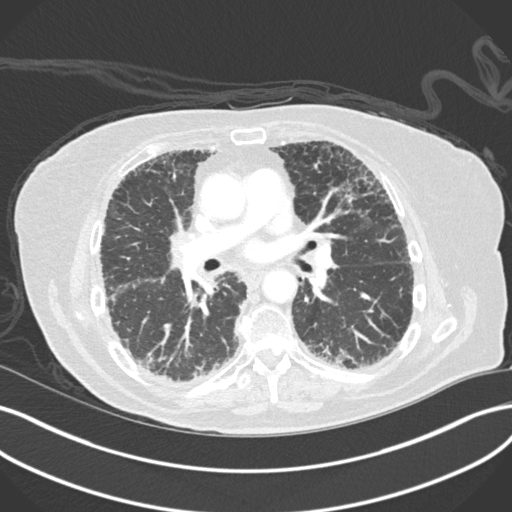
[im 93/152  lung]
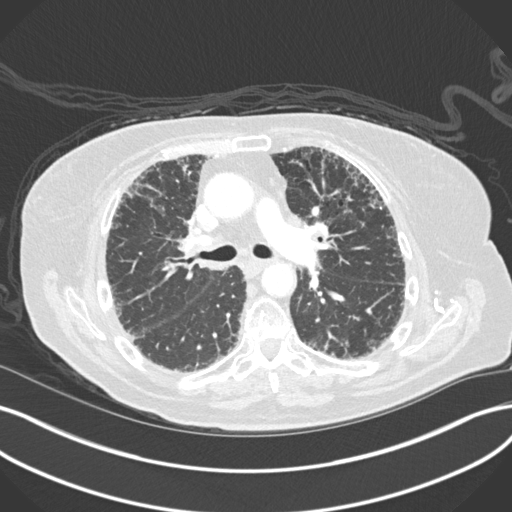
[im 105/152  mediastinal]
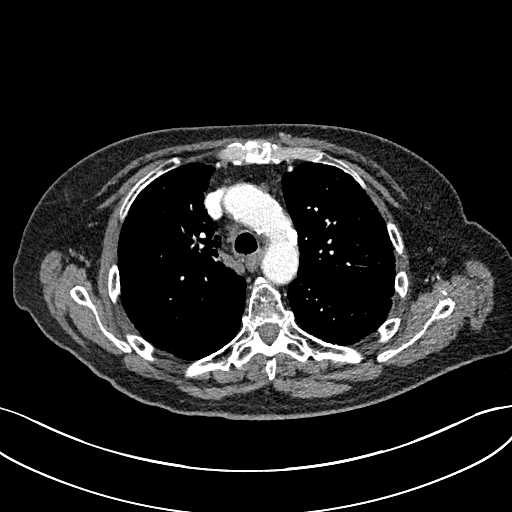
[im 105/152  lung]
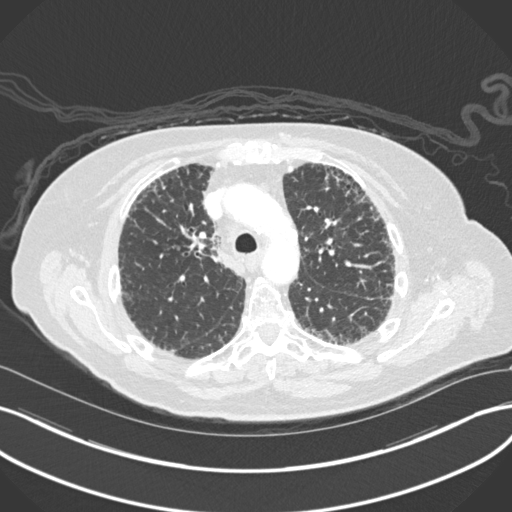
[im 117/152  lung]
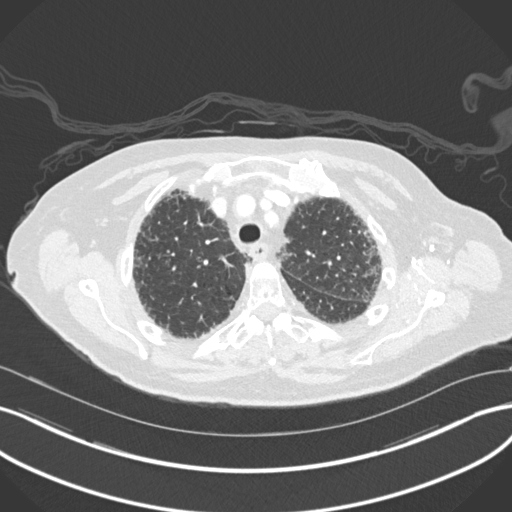
[im 128/152  lung]
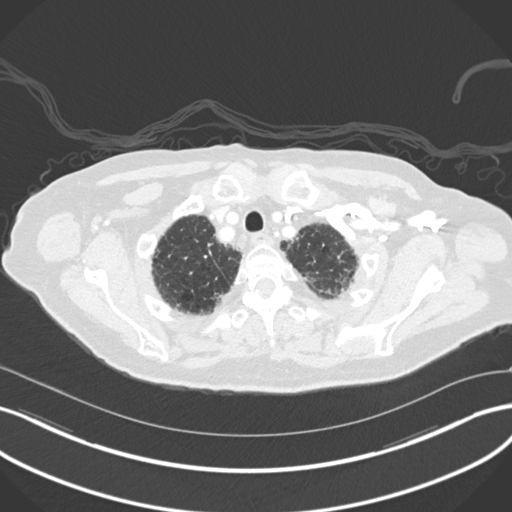
[im 140/152  lung]
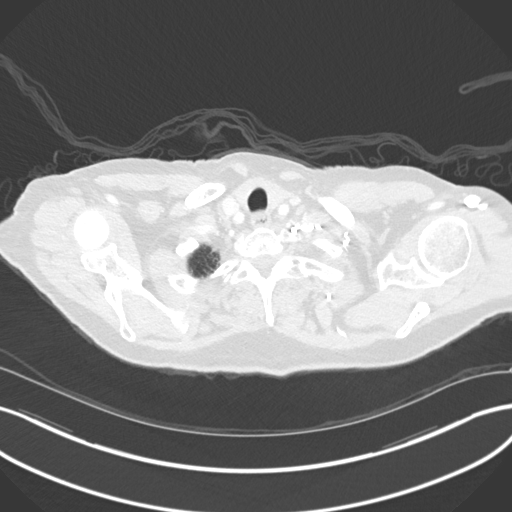

[Series 6: coronal · coronal · 0.61mm/px · 3 of 126 slices shown]
[im 26/126  lung]
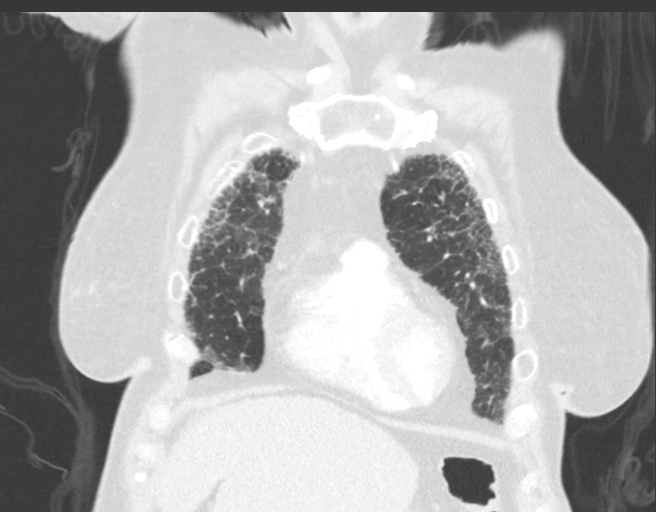
[im 51/126  lung]
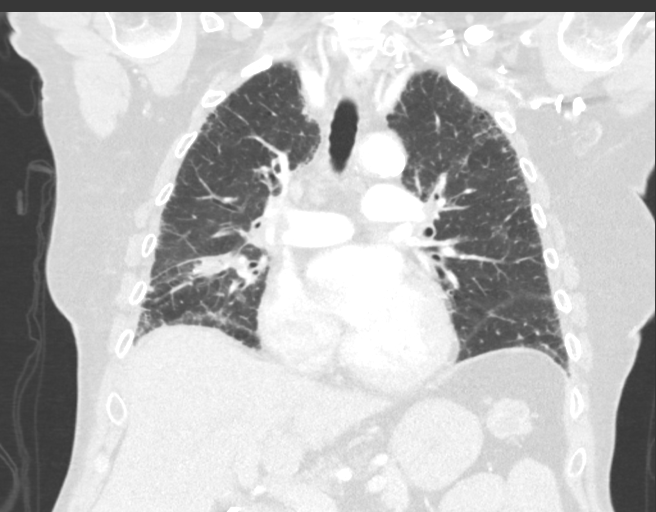
[im 76/126  lung]
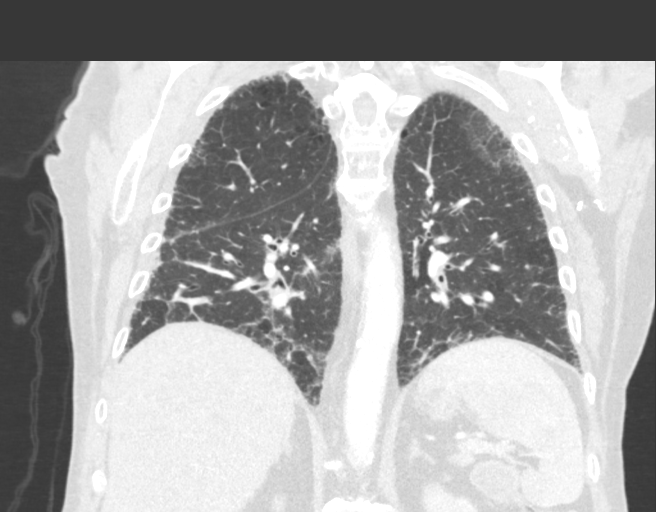

[15 of 36 positions shown; findings below may reference images not displayed]

FINDINGS: Cardiovascular: 1 Coronary artery calcification and aortic
atherosclerotic calcification.

Mediastinum/Nodes: No axillary or supraclavicular adenopathy. No
mediastinal or hilar adenopathy. No pericardial fluid. Esophagus
normal. Peribronchial thickening at RIGHT hilum is not changed.

Lungs/Pleura: RIGHT middle lobe pulmonary mass is decreased in size
measuring 2.9 x 1.8 cm compared to 3.5 x 2.3 cm. No new nodularity.

There is diffuse inter lobular septal thickening LEFT RIGHT lung.
Subpleural reticulation in the upper lobes.

Upper Abdomen: Limited view of the liver, kidneys, pancreas are
unremarkable. Normal adrenal glands.

Musculoskeletal: No aggressive osseous lesion.
IMPRESSION: 1. Interval decrease in volume RIGHT middle lobe pulmonary mass.
2. Stable RIGHT hilar para bronchovascular thickening.
3. No mediastinal lymphadenopathy.
4. No evidence disease progression.
5. Interstitial lung disease stable.

## 2021-08-30 DIAGNOSIS — E782 Mixed hyperlipidemia: Secondary | ICD-10-CM | POA: Diagnosis not present

## 2021-08-30 DIAGNOSIS — D509 Iron deficiency anemia, unspecified: Secondary | ICD-10-CM | POA: Diagnosis not present

## 2021-08-30 DIAGNOSIS — M79601 Pain in right arm: Secondary | ICD-10-CM | POA: Diagnosis not present

## 2021-08-30 DIAGNOSIS — E039 Hypothyroidism, unspecified: Secondary | ICD-10-CM | POA: Diagnosis not present

## 2021-08-30 DIAGNOSIS — M79642 Pain in left hand: Secondary | ICD-10-CM | POA: Diagnosis not present

## 2021-08-30 DIAGNOSIS — M65332 Trigger finger, left middle finger: Secondary | ICD-10-CM | POA: Diagnosis not present

## 2021-08-30 DIAGNOSIS — E1165 Type 2 diabetes mellitus with hyperglycemia: Secondary | ICD-10-CM | POA: Diagnosis not present

## 2021-08-30 DIAGNOSIS — M6530 Trigger finger, unspecified finger: Secondary | ICD-10-CM | POA: Diagnosis not present

## 2021-09-02 DIAGNOSIS — K7460 Unspecified cirrhosis of liver: Secondary | ICD-10-CM | POA: Diagnosis not present

## 2021-09-02 DIAGNOSIS — I10 Essential (primary) hypertension: Secondary | ICD-10-CM | POA: Diagnosis not present

## 2021-09-02 DIAGNOSIS — K219 Gastro-esophageal reflux disease without esophagitis: Secondary | ICD-10-CM | POA: Diagnosis not present

## 2021-09-02 DIAGNOSIS — R799 Abnormal finding of blood chemistry, unspecified: Secondary | ICD-10-CM | POA: Diagnosis not present

## 2021-09-02 DIAGNOSIS — E1165 Type 2 diabetes mellitus with hyperglycemia: Secondary | ICD-10-CM | POA: Diagnosis not present

## 2021-09-02 DIAGNOSIS — Z0001 Encounter for general adult medical examination with abnormal findings: Secondary | ICD-10-CM | POA: Diagnosis not present

## 2021-09-02 DIAGNOSIS — N1832 Chronic kidney disease, stage 3b: Secondary | ICD-10-CM | POA: Diagnosis not present

## 2021-09-02 DIAGNOSIS — E782 Mixed hyperlipidemia: Secondary | ICD-10-CM | POA: Diagnosis not present

## 2021-09-02 DIAGNOSIS — E039 Hypothyroidism, unspecified: Secondary | ICD-10-CM | POA: Diagnosis not present

## 2021-09-12 ENCOUNTER — Other Ambulatory Visit: Payer: Self-pay | Admitting: Gastroenterology

## 2021-09-13 ENCOUNTER — Ambulatory Visit: Payer: Medicare HMO | Admitting: Orthopedic Surgery

## 2021-09-13 DIAGNOSIS — E782 Mixed hyperlipidemia: Secondary | ICD-10-CM | POA: Diagnosis not present

## 2021-09-13 DIAGNOSIS — I10 Essential (primary) hypertension: Secondary | ICD-10-CM | POA: Diagnosis not present

## 2021-09-15 IMAGING — MR MR HEAD WO/W CM
16 of 20 series · 39 of 48 positions shown · IV contrast (gadavist)
Comparison: Brain MRI 09/21/2020 and earlier.

CLINICAL DATA: 69-year-old female with small cell lung cancer, has
not yet received radiation to the brain but after chemotherapy a
solitary 4 mm cerebellar metastasis was no longer visible in
[REDACTED]. Has declined whole brain radiation to this point.
Restaging.

EXAM:
MRI HEAD WITHOUT AND WITH CONTRAST
TECHNIQUE: Multiplanar, multiecho pulse sequences of the brain and surrounding
structures were obtained without and with intravenous contrast.
CONTRAST:  6mL GADAVIST GADOBUTROL 1 MMOL/ML IV SOLN

[Series 5: DWI · axial · 3.0mm · 1.36mm/px · z∈[-30,+118]mm · 5 of 104 slices shown (1 of 2)]
[im 1/104]
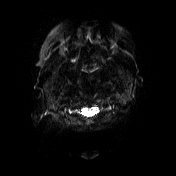
[im 26/104]
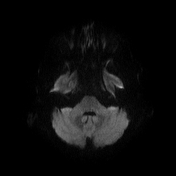
[im 52/104]
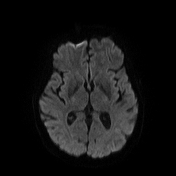
[im 78/104]
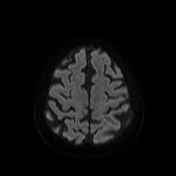
[im 104/104]
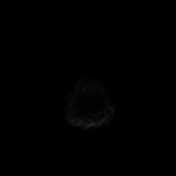

[Series 6: DWI · axial · 3.0mm · 1.36mm/px · z∈[-30,+118]mm · 2 of 52 slices shown (2 of 2)]
[im 1/52]
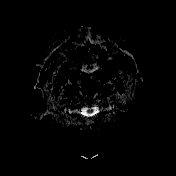
[im 52/52]
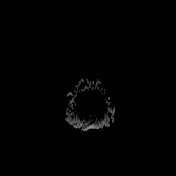

[Series 7: T1 · sagittal · 5.0mm · 0.75mm/px · 1 of 25 slices shown (1 of 4)]
[im 1/25]
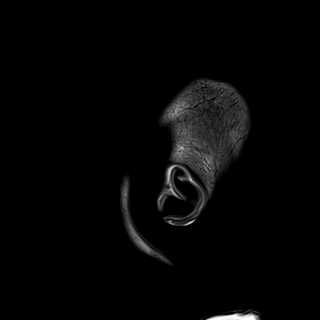

[Series 8: T2 · axial · 5.0mm · 0.62mm/px · 1 of 26 slices shown]
[im 1/26]
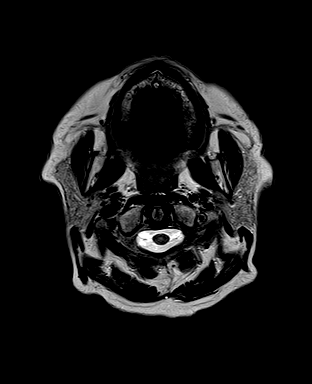

[Series 9: mip_images(sw) · axial · 24.0mm · 0.75mm/px · z∈[-54,+132]mm · 3 of 65 slices shown]
[im 1/65]
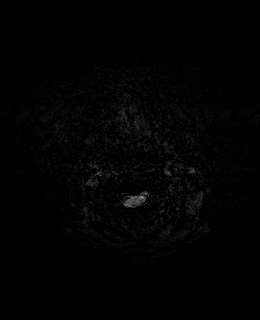
[im 33/65]
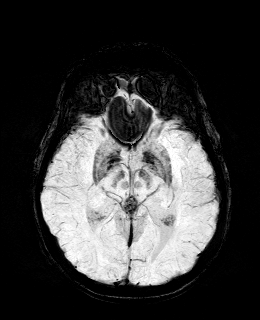
[im 65/65]
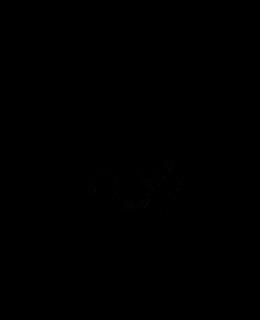

[Series 10: swi_images · axial · 3.0mm · 0.75mm/px · z∈[-65,+142]mm · 3 of 72 slices shown]
[im 1/72]
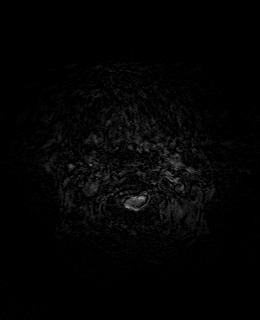
[im 36/72]
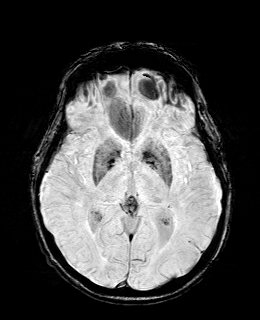
[im 72/72]
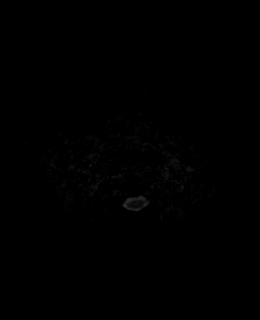

[Series 11: FLAIR · axial · 3.0mm · 0.75mm/px · z∈[-36,+113]mm · 2 of 52 slices shown]
[im 1/52]
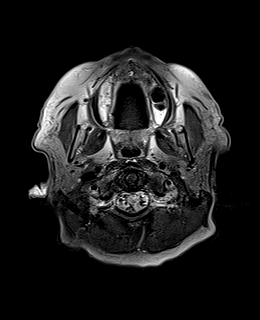
[im 52/52]
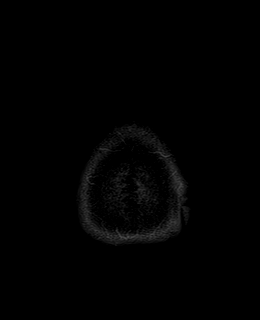

[Series 12: T1 · axial · 1.0mm · 0.94mm/px · z∈[-26,+113]mm · 6 of 144 slices shown (2 of 4)]
[im 1/144]
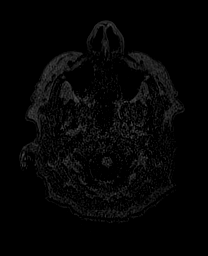
[im 29/144]
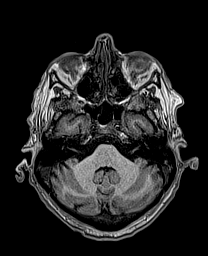
[im 58/144]
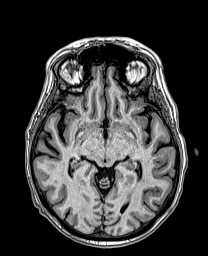
[im 86/144]
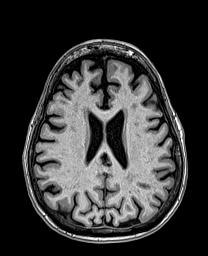
[im 115/144]
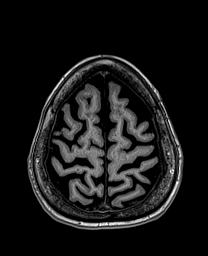
[im 144/144]
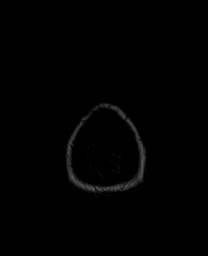

[Series 13: cor dwi_tracew · coronal · 5.0mm · 1.53mm/px · 3 of 62 slices shown]
[im 1/62]
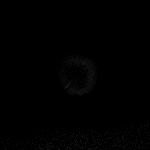
[im 31/62]
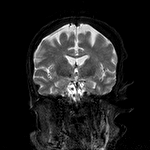
[im 62/62]
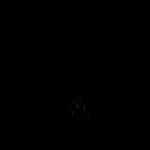

[Series 14: cor dwi_adc · coronal · 5.0mm · 1.53mm/px · 1 of 28 slices shown]
[im 1/28]
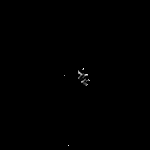

[Series 15: T2 post-contrast · coronal · 5.0mm · 0.57mm/px · 1 of 32 slices shown]
[im 1/32]
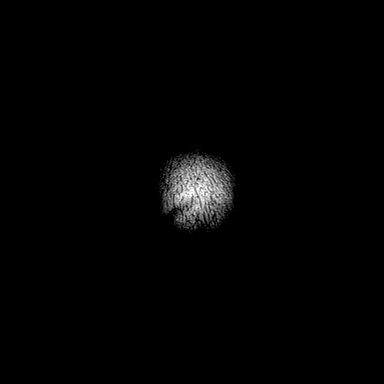

[Series 16: T1 post-contrast · axial · 1.0mm · 0.94mm/px · z∈[-26,+113]mm · 6 of 144 slices shown (1 of 3)]
[im 1/144]
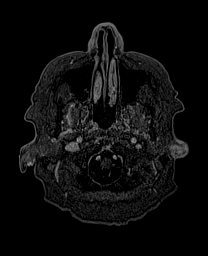
[im 29/144]
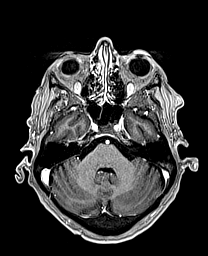
[im 58/144]
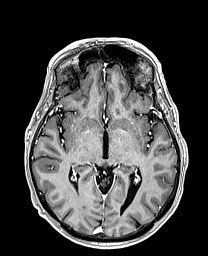
[im 86/144]
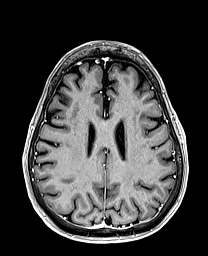
[im 115/144]
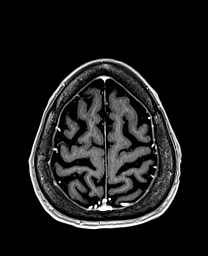
[im 144/144]
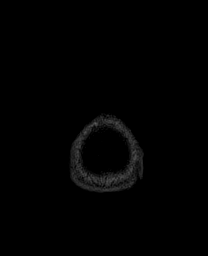

[Series 17: T1 · sagittal · 4.0mm · 0.94mm/px · 1 of 33 slices shown (3 of 4)]
[im 1/33]
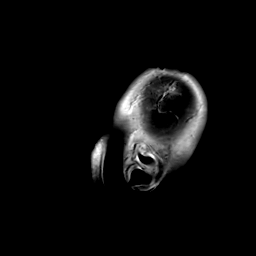

[Series 18: T1 · coronal · 4.0mm · 0.94mm/px · 2 of 42 slices shown (4 of 4)]
[im 1/42]
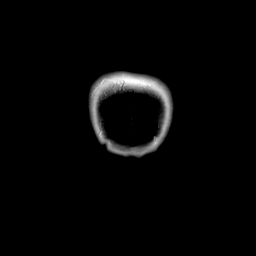
[im 42/42]
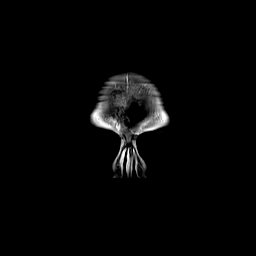

[Series 19: T1 post-contrast · coronal · 5.0mm · 0.43mm/px · 1 of 32 slices shown (2 of 3)]
[im 1/32]
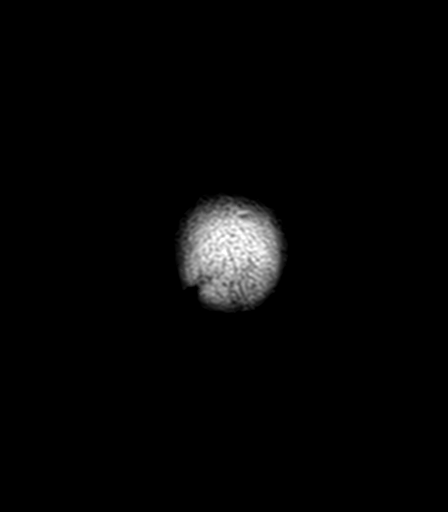

[Series 20: T1 post-contrast · sagittal · 5.0mm · 0.75mm/px · 1 of 25 slices shown (3 of 3)]
[im 1/25]
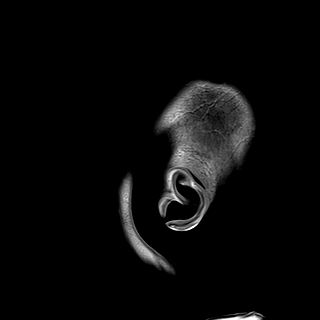

[39 of 48 positions shown; findings below may reference images not displayed]

FINDINGS: Brain: Punctate enhancement of the left cerebellum near midline seen
in [REDACTED] of last year remains resolved (series 16, image 39).

No abnormal enhancement today. No midline shift, mass effect, or
evidence of intracranial mass lesion. No dural thickening.

No restricted diffusion to suggest acute infarction. No
ventriculomegaly, extra-axial collection or acute intracranial
hemorrhage. Cervicomedullary junction and pituitary are within
normal limits.

Widely scattered nonspecific white matter T2 and FLAIR
hyperintensity in both hemispheres appears stable since [REDACTED].
Similar patchy T2 heterogeneity in the pons is stable. Punctate
microhemorrhage in the area of the resolved left vermis enhancement
is stable. No other chronic cerebral blood products. No cortical
encephalomalacia identified.

Vascular: Major intracranial vascular flow voids are stable. The
major dural venous sinuses are enhancing and appear to be patent.

Skull and upper cervical spine: Visualized bone marrow signal is
within normal limits. Negative visible cervical spine and spinal
cord.

Sinuses/Orbits: Stable and negative.

Other: Trace mastoid fluid is stable. Visible internal auditory
structures appear normal. Visible scalp and face appear negative.
IMPRESSION: 1. No metastatic disease identified. Stable resolution of the
suspected small left cerebellar metastasis.

2. Stable chronic and nonspecific white matter changes. No new
Intracranial abnormality.

## 2021-09-19 DIAGNOSIS — F339 Major depressive disorder, recurrent, unspecified: Secondary | ICD-10-CM | POA: Diagnosis not present

## 2021-09-19 DIAGNOSIS — F411 Generalized anxiety disorder: Secondary | ICD-10-CM | POA: Diagnosis not present

## 2021-09-19 DIAGNOSIS — Z79899 Other long term (current) drug therapy: Secondary | ICD-10-CM | POA: Diagnosis not present

## 2021-09-22 ENCOUNTER — Ambulatory Visit: Payer: Medicare HMO

## 2021-09-22 ENCOUNTER — Ambulatory Visit: Payer: Medicare HMO | Admitting: Orthopedic Surgery

## 2021-09-22 ENCOUNTER — Other Ambulatory Visit: Payer: Self-pay

## 2021-09-22 ENCOUNTER — Encounter: Payer: Self-pay | Admitting: Orthopedic Surgery

## 2021-09-22 VITALS — BP 110/80 | HR 85 | Ht 66.0 in | Wt 125.0 lb

## 2021-09-22 DIAGNOSIS — G8929 Other chronic pain: Secondary | ICD-10-CM

## 2021-09-22 DIAGNOSIS — M25512 Pain in left shoulder: Secondary | ICD-10-CM

## 2021-09-22 NOTE — Patient Instructions (Addendum)

## 2021-09-22 NOTE — Progress Notes (Signed)
New Patient Visit  Assessment: [name] is a 70 y.o. female with the following: 1. Chronic left shoulder pain; left rotator cuff tendinitis versus possible acute injury.  Plan: Patient had recent onset of left shoulder pain approximately 2 months ago.  It is most painful when he moves.  She has some difficulty with pain at nighttime.  She has been taking over-the-counter pain medications occasionally.  No therapy.  We discussed multiple treatment options, and she is interested in pursuing a steroid injection.  I think this will help with her symptoms.  We provided some home exercises for her to initiate as soon as possible.  If she has further issues, we can obtain an MRI, although she is a high risk surgical candidate.  Procedure note injection Left shoulder    Verbal consent was obtained to inject the left shoulder, subacromial space Timeout was completed to confirm the site of injection.  The skin was prepped with alcohol and ethyl chloride was sprayed at the injection site.  A 21-gauge needle was used to inject 40 mg of Depo-Medrol and 1% lidocaine (3 cc) into the subacromial space of the left shoulder using a posterolateral approach.  There were no complications. A sterile bandage was applied.    Follow-up: Return if symptoms worsen or fail to improve.  Subjective:  Chief Complaint  Patient presents with   Shoulder Pain    Lt shoulder pain for 2 mos. Pt states she has had     History of Present Illness: [name] is a 70 y.o. female who presents for evaluation of left shoulder pain.  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  As such, she relies heavily on her left shoulder.  She does have a history of left shoulder arthroscopy, although the details are unclear.  Approximately 2 months ago, she started pain in the left shoulder.  Pain is diffuse.  She was working in her kitchen, mopping her floor, when she noted a popping sensation in  the posteromedial aspect of the shoulder.  Since then, the pain has progressed, she now has pain in the anterior and lateral aspect of the shoulder.  She has some difficulty with overhead motion.   Review of Systems: No fevers or chills No numbness or tingling No chest pain No shortness of breath No bowel or bladder dysfunction No GI distress No headaches   Medical History:  Past Medical History:  Diagnosis Date   Acute on chronic diastolic CHF (congestive heart failure) ([place]) 02/28/2021   Allergic rhinitis    Amputation of hand, right (HCC)    traumatic   Anemia    Anxiety    ASCVD (arteriosclerotic cardiovascular disease)    MI in 96 requiring BMS CX; DES to M1 in 2000;normal coronary angiography in 2004   Cholelithiasis    COPD (chronic obstructive pulmonary disease) (HCC)    DDD (degenerative disc disease), lumbar    Depression    Diabetes mellitus    Type II   Diarrhea    DVT (deep venous thrombosis) ([place])    patient said no   GERD (gastroesophageal reflux disease)    Headache    migraine   History of kidney stones    2006   History of radiation therapy 06/28/2020-08/08/2020   right Lung; Dr. [name]   History of radiation therapy 03/22/2021   brain 03/02/2021-03/22/2021   Dr [name]   Hyperlipidemia    Hypertension    Hypotension  Hypothyroidism    Low back pain    Myocardial infarction [facility]) 01/1995   Nephrolithiasis 2006   stone extraction    Panic attacks    Peripheral neuropathy    Peripheral vascular disease (HCC)    legs   Pneumonia 12/22/2019   Sciatic pain    right   Small cell lung cancer ([place])    Tobacco abuse    Tremor     Past Surgical History:  Procedure Laterality Date   AGILE CAPSULE N/A 03/07/2020   Procedure: AGILE CAPSULE;  Surgeon: Daneil Dolin, MD;  [facility];  Service: Endoscopy;  Laterality: N/A;  7:30am   BACK SURGERY     fusion   BRONCHIAL NEEDLE ASPIRATION BIOPSY N/A 05/20/2020   Procedure:  BRONCHIAL NEEDLE ASPIRATION BIOPSIES;  Surgeon: Collene Gobble, MD;  Location: [facility];  Service: Pulmonary;  Laterality: N/A;   CHOLECYSTECTOMY     COLONOSCOPY  01/2009   SNK:[id number] rectum/repeat in 5 yrs   COLONOSCOPY N/A 04/29/2014   Dr.Rourk- attempted/incomplete colonoscopy. inadequate prep   COLONOSCOPY N/A 05/27/2014   HAL:[id number] coli. Colonic polyps-removed as described above.Status post segmental biopsy. single tubular adenoma and random colon bx neg   COLONOSCOPY WITH PROPOFOL N/A 12/23/2019   Procedure: COLONOSCOPY WITH PROPOFOL;  Surgeon: Ronnette Juniper, MD;  Location: [facility];  Service: Gastroenterology;  Laterality: N/A;   DILATION AND CURETTAGE OF UTERUS  1974   ESOPHAGOGASTRODUODENOSCOPY  05/2010   Dr. Tessie Fass, erosion. 78F dilation   ESOPHAGOGASTRODUODENOSCOPY N/A 04/29/2014   Dr.Rourk- normal esophagus s/p passage of maloney dilator. small hiatal hernia- bx= chronic inflammation.   ESOPHAGOGASTRODUODENOSCOPY (EGD) WITH PROPOFOL N/A 12/23/2019   Procedure: ESOPHAGOGASTRODUODENOSCOPY (EGD) WITH PROPOFOL;  Surgeon: Ronnette Juniper, MD;  Location: [facility];  Service: Gastroenterology;  Laterality: N/A;   GIVENS CAPSULE STUDY N/A 03/22/2020   Procedure: GIVENS CAPSULE STUDY;  Surgeon: Daneil Dolin, MD;  [facility];  Service: Endoscopy;  Laterality: N/A;  7:30am   HEMOSTASIS CLIP PLACEMENT  12/23/2019   Procedure: HEMOSTASIS CLIP PLACEMENT;  Surgeon: Ronnette Juniper, MD;  Location: [employer];  Service: Gastroenterology;;   HOT HEMOSTASIS N/A 12/23/2019   Procedure: HOT HEMOSTASIS (ARGON PLASMA COAGULATION/BICAP);  Surgeon: Ronnette Juniper, MD;  Location: [facility];  Service: Gastroenterology;  Laterality: N/A;   KNEE SURGERY Left    LEFT HEART CATH AND CORONARY ANGIOGRAPHY N/A 12/24/2019   Procedure: LEFT HEART CATH AND CORONARY ANGIOGRAPHY;  Surgeon: Nigel Mormon, MD;  Location: [facility];  Service: Cardiovascular;  Laterality: N/A;    MALONEY DILATION N/A 04/29/2014   Procedure: Venia Minks DILATION;  Surgeon: Daneil Dolin, MD;  [facility];  Service: Endoscopy;  Laterality: N/A;   PARTIAL HYSTERECTOMY  1978   POLYPECTOMY  12/23/2019   Procedure: POLYPECTOMY;  Surgeon: Ronnette Juniper, MD;  Location: [facility];  Service: Gastroenterology;;   SHOULDER SURGERY Left    Left shoulder for RTC;left arm surgery '98/left hand surgery 2001   TOTAL ABDOMINAL HYSTERECTOMY W/ BILATERAL SALPINGOOPHORECTOMY  7353   UMBILICAL HERNIA REPAIR  2008   VIDEO BRONCHOSCOPY WITH ENDOBRONCHIAL ULTRASOUND N/A 05/20/2020   Procedure: VIDEO BRONCHOSCOPY WITH ENDOBRONCHIAL ULTRASOUND;  Surgeon: Collene Gobble, MD;  Location: [facility];  Service: Pulmonary;  Laterality: N/A;    Family History  Problem Relation Age of Onset   Depression Mother    Bipolar disorder Mother    Dementia Mother    Pulmonary fibrosis Mother    Alcohol abuse Father  Aneurysm Father        deceased age 56, brain   Colon cancer Paternal Grandfather        age greater than 66   Healthy Child    Social History   Tobacco Use   Smoking status: Former    Packs/day: 0.50    Years: 40.00    Pack years: 20.00    Types: Cigarettes    Quit date: 08/30/2017    Years since quitting: 4.0   Smokeless tobacco: Never  Vaping Use   Vaping Use: Never used  Substance Use Topics   Alcohol use: No   Drug use: No    Allergies  Allergen Reactions   Bee Venom Shortness Of Breath and Swelling   Iohexol Shortness Of Breath and Other (See Comments)    CHEST TIGHTNESS and BREATHING PROBLEMS- NEEDS PRE-MEDS 13 hours beforehand-  premeds given 06/08/2020 without incident    Tape Other (See Comments)    Tears the skin!!   Betadine [Povidone Iodine] Rash   Ciprofloxacin Rash   Latex Rash and Other (See Comments)    NO latex gloves!!    Penicillins Swelling    Did it involve swelling of the face/tongue/throat, SOB, or low BP? Yes Did it involve sudden or severe  rash/hives, skin peeling, or any reaction on the inside of your mouth or nose? No Did you need to seek medical attention at a hospital or doctor's office? Yes When did it last happen?      1996 If all above answers are "NO", may proceed with cephalosporin use.   Povidone-Iodine Rash    Current Meds  Medication Sig   furosemide (LASIX) 10 MG/ML injection    megestrol (MEGACE) 40 MG tablet    metFORMIN (GLUCOPHAGE) 500 MG tablet     Objective: BP 110/80   Pulse 85   Ht 5' 6"  (1.676 m)   Wt 125 lb (56.7 kg)   BMI 20.18 kg/m   Physical Exam:  General: Elderly female.  Alert and oriented.  No acute distress. Gait: Normal gait.  History of right hand amputation.  Evaluation of left shoulder demonstrates no deformity.  No atrophy is appreciated.  Tenderness palpation over the anterior shoulder.  Tenderness to palpation over the lateral shoulder.  Pain with motion greater than 100 degrees of forward flexion.  Pain in the anterior shoulder with internal rotation to T12.  Fingers warm and well-perfused.  IMAGING: I personally ordered and reviewed the following images  X-rays of the left shoulder were obtained in clinic today.  No evidence of acute injury.  The glenohumeral joint is reduced.  There are some degenerative changes within the glenohumeral joint, but joint space is essentially maintained.  Sequelae of previous surgery, including a distal clavicle excision.  Impression: Left shoulder x-ray with mild degenerative changes, and sequelae of prior surgery.   New Medications:  No orders of the defined types were placed in this encounter.     Mordecai Rasmussen, MD  09/22/2021 8:47 PM

## 2021-10-18 ENCOUNTER — Other Ambulatory Visit: Payer: Self-pay | Admitting: Medical Oncology

## 2021-10-18 ENCOUNTER — Telehealth: Payer: Self-pay | Admitting: Medical Oncology

## 2021-10-18 DIAGNOSIS — T508X5D Adverse effect of diagnostic agents, subsequent encounter: Secondary | ICD-10-CM

## 2021-10-18 MED ORDER — PREDNISONE 50 MG PO TABS: ORAL_TABLET | ORAL | 5 refills | Status: DC

## 2021-10-18 NOTE — Telephone Encounter (Signed)
Pt confirmed CT scan dan premeds to take.

## 2021-10-20 ENCOUNTER — Other Ambulatory Visit: Payer: Self-pay

## 2021-10-20 ENCOUNTER — Telehealth: Payer: Self-pay | Admitting: Pulmonary Disease

## 2021-10-20 ENCOUNTER — Ambulatory Visit (HOSPITAL_COMMUNITY)
Admission: RE | Admit: 2021-10-20 | Discharge: 2021-10-20 | Disposition: A | Discharge status: Home / Self Care | Payer: Medicare HMO | Source: Ambulatory Visit | Attending: Internal Medicine | Admitting: Internal Medicine

## 2021-10-20 ENCOUNTER — Inpatient Hospital Stay: Payer: Medicare HMO | Attending: Internal Medicine

## 2021-10-20 DIAGNOSIS — J439 Emphysema, unspecified: Secondary | ICD-10-CM | POA: Diagnosis not present

## 2021-10-20 DIAGNOSIS — I700 Atherosclerosis of aorta: Secondary | ICD-10-CM | POA: Diagnosis not present

## 2021-10-20 DIAGNOSIS — K7689 Other specified diseases of liver: Secondary | ICD-10-CM | POA: Diagnosis not present

## 2021-10-20 DIAGNOSIS — C3490 Malignant neoplasm of unspecified part of unspecified bronchus or lung: Secondary | ICD-10-CM

## 2021-10-20 DIAGNOSIS — C7931 Secondary malignant neoplasm of brain: Secondary | ICD-10-CM | POA: Insufficient documentation

## 2021-10-20 DIAGNOSIS — C342 Malignant neoplasm of middle lobe, bronchus or lung: Secondary | ICD-10-CM | POA: Insufficient documentation

## 2021-10-20 DIAGNOSIS — K7460 Unspecified cirrhosis of liver: Secondary | ICD-10-CM | POA: Insufficient documentation

## 2021-10-20 DIAGNOSIS — J449 Chronic obstructive pulmonary disease, unspecified: Secondary | ICD-10-CM | POA: Insufficient documentation

## 2021-10-20 DIAGNOSIS — Z79899 Other long term (current) drug therapy: Secondary | ICD-10-CM | POA: Insufficient documentation

## 2021-10-20 LAB — CMP (CANCER CENTER ONLY)
ALT: 10 U/L (ref 0–44)
AST: 13 U/L — ABNORMAL LOW (ref 15–41)
Albumin: 4.1 g/dL (ref 3.5–5.0)
Alkaline Phosphatase: 49 U/L (ref 38–126)
Anion gap: 10 (ref 5–15)
BUN: 19 mg/dL (ref 8–23)
CO2: 22 mmol/L (ref 22–32)
Calcium: 10.1 mg/dL (ref 8.9–10.3)
Chloride: 104 mmol/L (ref 98–111)
Creatinine: 1.06 mg/dL — ABNORMAL HIGH (ref 0.44–1.00)
GFR, Estimated: 57 mL/min — ABNORMAL LOW (ref >60)
Glucose, Bld: 115 mg/dL — ABNORMAL HIGH (ref 70–99)
Potassium: 4.7 mmol/L (ref 3.5–5.1)
Sodium: 136 mmol/L (ref 135–145)
Total Bilirubin: 0.3 mg/dL (ref 0.3–1.2)
Total Protein: 7.7 g/dL (ref 6.5–8.1)

## 2021-10-20 LAB — CBC WITH DIFFERENTIAL (CANCER CENTER ONLY)
Abs Immature Granulocytes: 0.02 10*3/uL (ref 0.00–0.07)
Basophils Absolute: 0 10*3/uL (ref 0.0–0.1)
Basophils Relative: 0 %
Eosinophils Absolute: 0 10*3/uL (ref 0.0–0.5)
Eosinophils Relative: 0 %
HCT: 31.1 % — ABNORMAL LOW (ref 36.0–46.0)
Hemoglobin: 10.1 g/dL — ABNORMAL LOW (ref 12.0–15.0)
Immature Granulocytes: 0 %
Lymphocytes Relative: 10 %
Lymphs Abs: 0.5 10*3/uL — ABNORMAL LOW (ref 0.7–4.0)
MCH: 30.5 pg (ref 26.0–34.0)
MCHC: 32.5 g/dL (ref 30.0–36.0)
MCV: 94 fL (ref 80.0–100.0)
Monocytes Absolute: 0 10*3/uL — ABNORMAL LOW (ref 0.1–1.0)
Monocytes Relative: 1 %
Neutro Abs: 4.3 10*3/uL (ref 1.7–7.7)
Neutrophils Relative %: 89 %
Platelet Count: 215 10*3/uL (ref 150–400)
RBC: 3.31 MIL/uL — ABNORMAL LOW (ref 3.87–5.11)
RDW: 13.6 % (ref 11.5–15.5)
WBC Count: 4.9 10*3/uL (ref 4.0–10.5)
nRBC: 0 % (ref 0.0–0.2)

## 2021-10-20 MED ORDER — IOHEXOL 350 MG/ML SOLN
75.0000 mL | Freq: Once | INTRAVENOUS | Status: AC | PRN
  Administered 2021-10-20: 75 mL via INTRAVENOUS

## 2021-10-20 MED ORDER — SODIUM CHLORIDE (PF) 0.9 % IJ SOLN
INTRAMUSCULAR | Status: AC
  Filled 2021-10-20: qty 50

## 2021-10-20 NOTE — Telephone Encounter (Signed)
Atc patient. No answer. Left detailed VM (ok per DPR) letting her know that we are currently out of trelegy 100 samples but I will call her when we get some in.

## 2021-10-20 NOTE — Telephone Encounter (Signed)
ATC patient back. Left another detailed message advising patient we are out of samples but will call her when we get more in. Advised her to call back if she had any questions.

## 2021-10-20 NOTE — Telephone Encounter (Signed)
[name] (on [place]), friend of [name] returned call.  Stated there was no VM left but let her know detailed vm was left.  She was going to check again.  They were getting ready to leave for another appt.  Please advise.

## 2021-10-23 ENCOUNTER — Other Ambulatory Visit: Payer: Self-pay

## 2021-10-23 ENCOUNTER — Encounter: Payer: Self-pay | Admitting: Internal Medicine

## 2021-10-23 ENCOUNTER — Inpatient Hospital Stay (HOSPITAL_BASED_OUTPATIENT_CLINIC_OR_DEPARTMENT_OTHER): Payer: Medicare HMO | Admitting: Internal Medicine

## 2021-10-23 VITALS — BP 131/56 | HR 90 | Temp 98.1°F | Resp 17 | Ht 66.0 in | Wt 125.2 lb

## 2021-10-23 DIAGNOSIS — Z79899 Other long term (current) drug therapy: Secondary | ICD-10-CM | POA: Diagnosis not present

## 2021-10-23 DIAGNOSIS — C3490 Malignant neoplasm of unspecified part of unspecified bronchus or lung: Secondary | ICD-10-CM

## 2021-10-23 DIAGNOSIS — J449 Chronic obstructive pulmonary disease, unspecified: Secondary | ICD-10-CM | POA: Diagnosis not present

## 2021-10-23 DIAGNOSIS — C7931 Secondary malignant neoplasm of brain: Secondary | ICD-10-CM | POA: Diagnosis not present

## 2021-10-23 DIAGNOSIS — C342 Malignant neoplasm of middle lobe, bronchus or lung: Secondary | ICD-10-CM

## 2021-10-23 DIAGNOSIS — K7460 Unspecified cirrhosis of liver: Secondary | ICD-10-CM | POA: Diagnosis not present

## 2021-10-23 NOTE — Progress Notes (Signed)
[place] [phone]) [phone]   [phone]  OFFICE PROGRESS NOTE  [name], MD [address] [place] [address]  DIAGNOSIS: Extensive stage (T3, N2, M1c) small cell lung cancer presented with right middle lobe lung mass in addition to right hilar and low right paratracheal adenopathy in addition to tiny solitary brain metastasis diagnosed in August 2021.  PRIOR THERAPY:  1) Palliative systemic chemotherapy with carboplatin for AUC of 5 on day 1, etoposide 100 mg/M2 on days 1, 2 and 3 with Neulasta support.    Status post 4 cycles.  This is concurrent with radiotherapy which was completed.   CURRENT THERAPY: Observation.  INTERVAL HISTORY: [name] 71 y.o. female returns to the clinic today for follow-up visit accompanied by friend.  The patient is feeling fine today with no concerning complaints except for occasional dizzy spells.  She is followed by Dr. [name] after her whole brain radiation and expected to have repeat MRI of the brain later this month.  She denied having any current chest pain, shortness of breath, cough or hemoptysis.  She denied having any nausea, vomiting, diarrhea or constipation.  She has no headache or visual changes.  She has no recent weight loss or night sweats.  The patient had repeat CT scan of the chest, abdomen pelvis performed recently and she is here for evaluation and discussion of her scan results.  MEDICAL HISTORY: Past Medical History:  Diagnosis Date   Acute on chronic diastolic CHF (congestive heart failure) ([place]) 02/28/2021   Allergic rhinitis    Amputation of hand, right (HCC)    traumatic   Anemia    Anxiety    ASCVD (arteriosclerotic cardiovascular disease)    MI in 96 requiring BMS CX; DES to M1 in 2000;normal coronary angiography in 2004   Cholelithiasis    COPD (chronic obstructive pulmonary disease) (HCC)    DDD (degenerative disc disease), lumbar    Depression    Diabetes  mellitus    Type II   Diarrhea    DVT (deep venous thrombosis) ([place])    patient said no   GERD (gastroesophageal reflux disease)    Headache    migraine   History of kidney stones    2006   History of radiation therapy 06/28/2020-08/08/2020   right Lung; Dr. [name]   History of radiation therapy 03/22/2021   brain 03/02/2021-03/22/2021   Dr [name]   Hyperlipidemia    Hypertension    Hypotension    Hypothyroidism    Low back pain    Myocardial infarction [facility]) 01/1995   Nephrolithiasis 2006   stone extraction    Panic attacks    Peripheral neuropathy    Peripheral vascular disease (HCC)    legs   Pneumonia 12/22/2019   Sciatic pain    right   Small cell lung cancer ([place])    Tobacco abuse    Tremor     ALLERGIES:  is allergic to bee venom, iohexol, tape, betadine [povidone iodine], ciprofloxacin, latex, penicillins, and povidone-iodine.  MEDICATIONS:  Current Outpatient Medications  Medication Sig Dispense Refill   albuterol (VENTOLIN HFA) 108 (90 Base) MCG/ACT inhaler Inhale 2 puffs into the lungs every 4 (four) hours as needed for wheezing or shortness of breath. 1 each 3   aspirin-acetaminophen-caffeine (EXCEDRIN MIGRAINE) 250-250-65 MG tablet Take 2 tablets by mouth daily as needed for headache. Up to 4 times a week     Benzocaine (  BOIL-EASE EX) Apply 1 application topically daily as needed (Boil).     carbamide peroxide (DEBROX) 6.5 % OTIC solution Place 5 drops into both ears daily as needed (ears).     Cholecalciferol (VITAMIN D3) 50 MCG (2000 UT) TABS Take 8,000 Units by mouth daily.     colestipol (COLESTID) 1 g tablet TAKE 3 TABLETS BY MOUTH ONCE DAILY. DO NOT TAKE WITHIN TWO HOURS OF OTHER MEDS. 90 tablet 3   diazepam (VALIUM) 5 MG tablet Take 2.5 mg by mouth at bedtime. 1/2 tab qhs     diclofenac Sodium (VOLTAREN) 1 % GEL Apply topically as needed.     empagliflozin (JARDIANCE) 10 MG TABS tablet Take by mouth daily.     Ferrous Sulfate (IRON) 325 (65  Fe) MG TABS Take 1 tablet by mouth daily.     Fluticasone-Umeclidin-Vilant (TRELEGY ELLIPTA) 100-62.5-25 MCG/ACT AEPB 1 puff     furosemide (LASIX) 10 MG/ML injection      guaiFENesin-dextromethorphan (ROBITUSSIN DM) 100-10 MG/5ML syrup Take 5 mLs by mouth every 4 (four) hours as needed for cough.     hydrocortisone cream 1 % Apply 1 application topically daily as needed for itching.     Hydrocortisone, Perianal, (PROCTO-PAK) 1 % CREA Apply anorectally twice daily for two weeks. 28 g 0   levothyroxine (SYNTHROID) 125 MCG tablet 1 tablet in the morning on an empty stomach     megestrol (MEGACE) 40 MG tablet      metFORMIN (GLUCOPHAGE) 500 MG tablet      MODERNA COVID-19 BIVAL BOOSTER 50 MCG/0.5ML injection      neomycin-bacitracin-polymyxin (NEOSPORIN) OINT Apply 1 application topically daily as needed for irritation or wound care.     oxyCODONE-acetaminophen (PERCOCET/ROXICET) 5-325 MG tablet Take 0.5 tablets by mouth 2 (two) times daily as needed for severe pain.     OXYGEN      pantoprazole (PROTONIX) 40 MG tablet Take 40 mg by mouth daily.     PARoxetine (PAXIL) 20 MG tablet Take 20 mg by mouth daily.     predniSONE (DELTASONE) 50 MG tablet Take one tablet by mouth 13 hours, on tablet 7 hours and 1 tablet one hour prior to CT scan. Take benaddryl 50 mg onr hour prior to scan. 3 tablet 5   rosuvastatin (CRESTOR) 20 MG tablet Take 20 mg by mouth daily as needed.     traZODone (DESYREL) 150 MG tablet Take 75 mg by mouth at bedtime.     No current facility-administered medications for this visit.    SURGICAL HISTORY:  Past Surgical History:  Procedure Laterality Date   AGILE CAPSULE N/A 03/07/2020   Procedure: AGILE CAPSULE;  Surgeon: Daneil Dolin, MD;  [facility];  Service: Endoscopy;  Laterality: N/A;  7:30am   BACK SURGERY     fusion   BRONCHIAL NEEDLE ASPIRATION BIOPSY N/A 05/20/2020   Procedure: BRONCHIAL NEEDLE ASPIRATION BIOPSIES;  Surgeon: Collene Gobble, MD;   Location: [facility];  Service: Pulmonary;  Laterality: N/A;   CHOLECYSTECTOMY     COLONOSCOPY  01/2009   ESP:[id number] rectum/repeat in 5 yrs   COLONOSCOPY N/A 04/29/2014   Dr.Rourk- attempted/incomplete colonoscopy. inadequate prep   COLONOSCOPY N/A 05/27/2014   MAU:[id number] coli. Colonic polyps-removed as described above.Status post segmental biopsy. single tubular adenoma and random colon bx neg   COLONOSCOPY WITH PROPOFOL N/A 12/23/2019   Procedure: COLONOSCOPY WITH PROPOFOL;  Surgeon: Ronnette Juniper, MD;  Location: [facility];  Service: Gastroenterology;  Laterality: N/A;  DILATION AND CURETTAGE OF UTERUS  1974   ESOPHAGOGASTRODUODENOSCOPY  05/2010   Dr. Tessie Fass, erosion. 70F dilation   ESOPHAGOGASTRODUODENOSCOPY N/A 04/29/2014   Dr.Rourk- normal esophagus s/p passage of maloney dilator. small hiatal hernia- bx= chronic inflammation.   ESOPHAGOGASTRODUODENOSCOPY (EGD) WITH PROPOFOL N/A 12/23/2019   Procedure: ESOPHAGOGASTRODUODENOSCOPY (EGD) WITH PROPOFOL;  Surgeon: Ronnette Juniper, MD;  Location: [place];  Service: Gastroenterology;  Laterality: N/A;   GIVENS CAPSULE STUDY N/A 03/22/2020   Procedure: GIVENS CAPSULE STUDY;  Surgeon: Daneil Dolin, MD;  [facility];  Service: Endoscopy;  Laterality: N/A;  7:30am   HEMOSTASIS CLIP PLACEMENT  12/23/2019   Procedure: HEMOSTASIS CLIP PLACEMENT;  Surgeon: Ronnette Juniper, MD;  Location: [place] [place];  Service: Gastroenterology;;   HOT HEMOSTASIS N/A 12/23/2019   Procedure: HOT HEMOSTASIS (ARGON PLASMA COAGULATION/BICAP);  Surgeon: Ronnette Juniper, MD;  Location: [place];  Service: Gastroenterology;  Laterality: N/A;   KNEE SURGERY Left    LEFT HEART CATH AND CORONARY ANGIOGRAPHY N/A 12/24/2019   Procedure: LEFT HEART CATH AND CORONARY ANGIOGRAPHY;  Surgeon: Nigel Mormon, MD;  Location: [place] [facility];  Service: Cardiovascular;  Laterality: N/A;   MALONEY DILATION N/A 04/29/2014   Procedure: Venia Minks DILATION;   Surgeon: Daneil Dolin, MD;  [facility];  Service: Endoscopy;  Laterality: N/A;   PARTIAL HYSTERECTOMY  1978   POLYPECTOMY  12/23/2019   Procedure: POLYPECTOMY;  Surgeon: Ronnette Juniper, MD;  Location: [place];  Service: Gastroenterology;;   SHOULDER SURGERY Left    Left shoulder for RTC;left arm surgery '98/left hand surgery 2001   TOTAL ABDOMINAL HYSTERECTOMY W/ BILATERAL SALPINGOOPHORECTOMY  1856   UMBILICAL HERNIA REPAIR  2008   VIDEO BRONCHOSCOPY WITH ENDOBRONCHIAL ULTRASOUND N/A 05/20/2020   Procedure: VIDEO BRONCHOSCOPY WITH ENDOBRONCHIAL ULTRASOUND;  Surgeon: Collene Gobble, MD;  Location: [facility];  Service: Pulmonary;  Laterality: N/A;    REVIEW OF SYSTEMS:  A comprehensive review of systems was negative except for: Neurological: positive for dizziness   PHYSICAL EXAMINATION: General appearance: alert, cooperative, and no distress Head: Normocephalic, without obvious abnormality, atraumatic Neck: no adenopathy, no JVD, supple, symmetrical, trachea midline, and thyroid not enlarged, symmetric, no tenderness/mass/nodules Lymph nodes: Cervical, supraclavicular, and axillary nodes normal. Resp: clear to auscultation bilaterally Back: symmetric, no curvature. ROM normal. No CVA tenderness. Cardio: regular rate and rhythm, S1, S2 normal, no murmur, click, rub or gallop GI: soft, non-tender; bowel sounds normal; no masses,  no organomegaly Extremities: extremities normal, atraumatic, no cyanosis or edema  ECOG PERFORMANCE STATUS: 1 - Symptomatic but completely ambulatory  Blood pressure (!) 131/56, pulse 90, temperature 98.1 F (36.7 C), temperature source Axillary, resp. rate 17, height 5' 6"  (1.676 m), weight 125 lb 3.2 oz (56.8 kg), SpO2 94 %.  LABORATORY DATA: Lab Results  Component Value Date   WBC 4.9 10/20/2021   HGB 10.1 (L) 10/20/2021   HCT 31.1 (L) 10/20/2021   MCV 94.0 10/20/2021   PLT 215 10/20/2021      Chemistry      Component Value  Date/Time   NA 136 10/20/2021 1357   NA 138 12/19/2020 1003   K 4.7 10/20/2021 1357   CL 104 10/20/2021 1357   CO2 22 10/20/2021 1357   BUN 19 10/20/2021 1357   BUN 15 12/19/2020 1003   CREATININE 1.06 (H) 10/20/2021 1357   CREATININE 0.85 05/03/2012 0942      Component Value Date/Time   CALCIUM 10.1 10/20/2021 1357   ALKPHOS 49 10/20/2021 1357  AST 13 (L) 10/20/2021 1357   ALT 10 10/20/2021 1357   BILITOT 0.3 10/20/2021 1357       RADIOGRAPHIC STUDIES: CT Chest W Contrast  Result Date: 10/22/2021 CLINICAL DATA:  Restaging small cell lung cancer. EXAM: CT CHEST, ABDOMEN, AND PELVIS WITH CONTRAST TECHNIQUE: Multidetector CT imaging of the chest, abdomen and pelvis was performed following the standard protocol during bolus administration of intravenous contrast. CONTRAST:  65m OMNIPAQUE IOHEXOL 350 MG/ML SOLN COMPARISON:  06/21/2021 FINDINGS: CT CHEST FINDINGS Cardiovascular: Normal heart size. No pericardial effusion. Aortic atherosclerosis with coronary artery calcifications. Mediastinum/Nodes: Normal appearance of the thyroid gland. The trachea appears patent and is midline. Normal appearance of the esophagus. No enlarged mediastinal or hilar lymph nodes. No axillary or supraclavicular adenopathy. Lungs/Pleura: Mild changes of paraseptal and centrilobular emphysema. No pleural effusion identified. No acute airspace consolidation. Changes of pulmonary fibrosis are again noted including diffuse peripheral predominant interstitial reticulation, septal thickening, and traction bronchiectasis. The treated lesion within the right middle lobe measures 1.8 x 1.5 cm on today's study, image 70/4. On the previous exam this area was measured at 2.7 x 2.0 cm. Stable soft tissue thickening surrounding the adjacent hilar structures. Musculoskeletal: No chest wall mass or suspicious bone lesions identified. CT ABDOMEN PELVIS FINDINGS Hepatobiliary: Nodular contour the liver is again noted suggestive of  cirrhosis. Status post cholecystectomy. No bile duct dilatation. Pancreas: Unremarkable. No pancreatic ductal dilatation or surrounding inflammatory changes. Spleen: Normal in size without focal abnormality. Adrenals/Urinary Tract: Adrenal glands are unremarkable. Kidneys are normal, without renal calculi, focal lesion, or hydronephrosis. Bladder is unremarkable. Stomach/Bowel: Stomach is within normal limits. Appendix appears normal. No evidence of bowel wall thickening, distention, or inflammatory changes. Vascular/Lymphatic: Aortic atherosclerosis. No aneurysm. No abdominopelvic adenopathy identified. Reproductive: Status post hysterectomy. No adnexal masses. Other: No abdominal wall hernia or abnormality. No abdominopelvic ascites. Musculoskeletal: No acute or significant osseous findings. IMPRESSION: 1. Continued decrease in size of treated lesion within the right middle lobe. No new or progressive disease identified. 2. Morphologic features of the liver suggestive of cirrhosis. 3. Chronic interstitial lung disease is identified compatible with pulmonary fibrosis. More definitive characterization could be obtained with high-resolution CT of the chest if clinically indicated. 4. Coronary artery atherosclerotic calcifications. 5. Aortic Atherosclerosis (ICD10-I70.0) and Emphysema (ICD10-J43.9). Electronically Signed   By: TKerby MoorsM.D.   On: 10/22/2021 10:48   CT Abdomen Pelvis W Contrast  Result Date: 10/22/2021 CLINICAL DATA:  Restaging small cell lung cancer. EXAM: CT CHEST, ABDOMEN, AND PELVIS WITH CONTRAST TECHNIQUE: Multidetector CT imaging of the chest, abdomen and pelvis was performed following the standard protocol during bolus administration of intravenous contrast. CONTRAST:  772mOMNIPAQUE IOHEXOL 350 MG/ML SOLN COMPARISON:  06/21/2021 FINDINGS: CT CHEST FINDINGS Cardiovascular: Normal heart size. No pericardial effusion. Aortic atherosclerosis with coronary artery calcifications.  Mediastinum/Nodes: Normal appearance of the thyroid gland. The trachea appears patent and is midline. Normal appearance of the esophagus. No enlarged mediastinal or hilar lymph nodes. No axillary or supraclavicular adenopathy. Lungs/Pleura: Mild changes of paraseptal and centrilobular emphysema. No pleural effusion identified. No acute airspace consolidation. Changes of pulmonary fibrosis are again noted including diffuse peripheral predominant interstitial reticulation, septal thickening, and traction bronchiectasis. The treated lesion within the right middle lobe measures 1.8 x 1.5 cm on today's study, image 70/4. On the previous exam this area was measured at 2.7 x 2.0 cm. Stable soft tissue thickening surrounding the adjacent hilar structures. Musculoskeletal: No chest wall mass or suspicious bone lesions identified. CT  ABDOMEN PELVIS FINDINGS Hepatobiliary: Nodular contour the liver is again noted suggestive of cirrhosis. Status post cholecystectomy. No bile duct dilatation. Pancreas: Unremarkable. No pancreatic ductal dilatation or surrounding inflammatory changes. Spleen: Normal in size without focal abnormality. Adrenals/Urinary Tract: Adrenal glands are unremarkable. Kidneys are normal, without renal calculi, focal lesion, or hydronephrosis. Bladder is unremarkable. Stomach/Bowel: Stomach is within normal limits. Appendix appears normal. No evidence of bowel wall thickening, distention, or inflammatory changes. Vascular/Lymphatic: Aortic atherosclerosis. No aneurysm. No abdominopelvic adenopathy identified. Reproductive: Status post hysterectomy. No adnexal masses. Other: No abdominal wall hernia or abnormality. No abdominopelvic ascites. Musculoskeletal: No acute or significant osseous findings. IMPRESSION: 1. Continued decrease in size of treated lesion within the right middle lobe. No new or progressive disease identified. 2. Morphologic features of the liver suggestive of cirrhosis. 3. Chronic  interstitial lung disease is identified compatible with pulmonary fibrosis. More definitive characterization could be obtained with high-resolution CT of the chest if clinically indicated. 4. Coronary artery atherosclerotic calcifications. 5. Aortic Atherosclerosis (ICD10-I70.0) and Emphysema (ICD10-J43.9). Electronically Signed   By: Kerby Moors M.D.   On: 10/22/2021 10:48     ASSESSMENT AND PLAN: This is a very pleasant 71 years old white female recently diagnosed with extensive stage small cell lung cancer (T3, N2, M1 C) presented with right middle lobe lung mass in addition to right hilar and low right paratracheal lymphadenopathy in addition to tiny solitary brain metastasis diagnosed in August 2021. The patient is currently undergoing systemic chemotherapy with carboplatin for AUC of 5 on day 1, etoposide 100 mg/M2 with Neulasta support.  Status post 4 cycles.   She had evidence of metastatic disease to the brain and she underwent whole brain irradiation under the care of Dr. [name]. The patient is currently on observation and she is feeling fine today with no concerning complaints. She had repeat CT scan of the chest, abdomen pelvis performed recently.  I personally and independently reviewed the scan and discussed the results with the patient and her caregiver. Her scan showed no concerning findings for disease progression. I recommended for her to continue on observation with repeat CT scan of the chest, abdomen pelvis in 6 months. For the liver cirrhosis, she will continue her routine follow-up visit by her primary care physician. For the history of COPD and suspicious interstitial lung disease, she is followed by Dr. [name]. The patient was advised to call immediately if she has any other concerning symptoms in the interval. The patient voices understanding of current disease status and treatment options and is in agreement with the current care plan.  All questions were answered. The  patient knows to call the clinic with any problems, questions or concerns. We can certainly see the patient much sooner if necessary.  Disclaimer: This note was dictated with voice recognition software. Similar sounding words can inadvertently be transcribed and may not be corrected upon review.

## 2021-10-31 ENCOUNTER — Telehealth: Payer: Self-pay | Admitting: *Deleted

## 2021-10-31 ENCOUNTER — Other Ambulatory Visit: Payer: Self-pay

## 2021-10-31 ENCOUNTER — Encounter: Payer: Medicare HMO | Attending: Internal Medicine | Admitting: Nutrition

## 2021-10-31 VITALS — Ht 66.25 in | Wt 126.6 lb

## 2021-10-31 DIAGNOSIS — Z6820 Body mass index (BMI) 20.0-20.9, adult: Secondary | ICD-10-CM | POA: Insufficient documentation

## 2021-10-31 DIAGNOSIS — I129 Hypertensive chronic kidney disease with stage 1 through stage 4 chronic kidney disease, or unspecified chronic kidney disease: Secondary | ICD-10-CM

## 2021-10-31 DIAGNOSIS — K7030 Alcoholic cirrhosis of liver without ascites: Secondary | ICD-10-CM

## 2021-10-31 DIAGNOSIS — R918 Other nonspecific abnormal finding of lung field: Secondary | ICD-10-CM

## 2021-10-31 DIAGNOSIS — E440 Moderate protein-calorie malnutrition: Secondary | ICD-10-CM

## 2021-10-31 DIAGNOSIS — D509 Iron deficiency anemia, unspecified: Secondary | ICD-10-CM

## 2021-10-31 DIAGNOSIS — R634 Abnormal weight loss: Secondary | ICD-10-CM | POA: Insufficient documentation

## 2021-10-31 DIAGNOSIS — E118 Type 2 diabetes mellitus with unspecified complications: Secondary | ICD-10-CM

## 2021-10-31 DIAGNOSIS — E119 Type 2 diabetes mellitus without complications: Secondary | ICD-10-CM | POA: Diagnosis not present

## 2021-10-31 DIAGNOSIS — C3490 Malignant neoplasm of unspecified part of unspecified bronchus or lung: Secondary | ICD-10-CM

## 2021-10-31 DIAGNOSIS — N183 Chronic kidney disease, stage 3 unspecified: Secondary | ICD-10-CM

## 2021-10-31 DIAGNOSIS — C7931 Secondary malignant neoplasm of brain: Secondary | ICD-10-CM

## 2021-10-31 NOTE — Progress Notes (Signed)
Medical Nutrition Therapy  Appointment Start time:  6387  Appointment End time:  1600  Primary concerns today: Weight loss, s/p chemo/cancer and diabetes type 2  Referral diagnosis: R 63.4, e11.8,  Preferred learning style: no preference Learning readiness: Contemplating    NUTRITION ASSESSMENT  Here with a friend of hers who helps look after her. She helps cook meals, takes her for her appointments. She finished chemo treatment in June 2022 for brain tumor. Had lung cancer before that. Se[facility] a mental health counselor at [facility] and sees Dr. Nevada Crane as her PCP. Lung cancer treatment May 2021. Has lost weight from 189 lbs down to 126 lbs in the last 1-2 years- 55 lbs overall. She refuses to make many changes with her diet and food choices. Only eats 1 meal per day. Prefers chips and soda. Suggested food items were not well received.  She notes she doesn't like vegetables and is a picky eater. Wants to gain weight and improve her health but isn't willing to change her food choices to improve her outcomes. Her food intake is insuffient to meet her nutritional needs and is contributing to her weight loss and possible malnutrition. No appetite. Megace is ordered but doesn't seem to improve appetite. Drinks a lot of water 5-6 bottles per day. Does drink sodas at times. She is missing her hand on her right to her wrist.  Has Type 2 DM. A1C 6.5% Doesn't usually check blood sugars often.   Currently taking Jardiance 10 mg and Metformin 500 mg a day.  Anthropometrics  Wt Readings from Last 3 Encounters:  10/31/21 126 lb 9.6 oz (57.4 kg)  10/23/21 125 lb 3.2 oz (56.8 kg)  09/22/21 125 lb (56.7 kg)   Ht Readings from Last 3 Encounters:  10/31/21 5' 6.25" (1.683 m)  10/23/21 5' 6"  (1.676 m)  09/22/21 5' 6"  (1.676 m)   Body mass index is 20.28 kg/m. @BMIFA @ Facility age limit for growth percentiles is 20 years. Facility age limit for growth percentiles is 20  years.   Clinical Medical Hx: see chart Medications: Metformin , Jardiance Labs:  Lab Results  Component Value Date   HGBA1C 6.5 (H) 03/01/2021    Notable Signs/Symptoms: cold, no appetite, fatigue, weak,   Lifestyle & Dietary Hx Lives with a friend who helps her with meals and transportation/appointments and medications.  Estimated daily fluid intake: 80 oz Supplements:  Sleep: varies 4-5 hrs  Stress / self-care: her health Current average weekly physical activity: ADL  24-Hr Dietary Recall First Meal: Pringles, Dr. Malachi Bonds, coffee  Pringles in the evening  Beverages: water and soda.  Estimated Energy Needs Calories: 1800 Carbohydrate: 200g Protein: 135g Fat: 50g   NUTRITION DIAGNOSIS  NI-1.4 Inadequate energy intake As related to Weight loss of 55 lbs and anorexia.  As evidenced by poor diet insuffient in calories, protein and overall nutrients and 55 lbs weight loss.   NUTRITION INTERVENTION  Nutrition education (E-1) on the following topics:   Importance of good nutrition to meet her needs for cell repair, promote weight gain, build muscle and tissues. Discussed dangers of insuffinet nutrient intake to meet her needs contributing to possible malnutrition. Discussed need to eat small frequent meals of nutrient dense food Dangers of sugar and fat in the diet and potentially causing cancer cells to grow.  Handouts Provided Include  My Plate Meal Plan Card  Learning Style & Readiness for Change Teaching method utilized: Visual & Auditory  Demonstrated degree of understanding via: Teach  Back  Barriers to learning/adherence to lifestyle change: Resistance to change eating habits  Goals Established by Pt Goals  Eat 1/2 banana and pb sandwich at 9 am and glass of milk Cut down on pringles Increase intake of dried beans,  Increase protein rich foods, fruits and vegetables Try to eat 3 small  meals per day    MONITORING & EVALUATION Dietary intake, weekly  physical activity, and weight in 3 months. Would recommend to consider another oral agent than Jardiance she has has an issue with weight loss for her diabetes.  Next Steps  Patient is to increase food intake of nutrient dense foods.Marland Kitchen

## 2021-10-31 NOTE — Telephone Encounter (Signed)
Called patient to inform of MRI on 11-10-21- arrival time- 3:30 pm @ [facility], no restrictions to test, patient to receive results from Dr. [name] on 11-13-21 @ 3:45 pm, spoke with patient's friend Alexis Frock and she is aware of these appts.

## 2021-10-31 NOTE — Patient Instructions (Signed)
Goals  Eat 1/2 banana and pb sandwich at 9 am and glass of milk Cut down on pringles Increase intake of dried beans,  Increase protein rich foods, fruits and vegetables Try to eat 3 small  meals per day

## 2021-11-01 ENCOUNTER — Encounter: Payer: Self-pay | Admitting: Nutrition

## 2021-11-02 ENCOUNTER — Encounter: Payer: Self-pay | Admitting: Obstetrics & Gynecology

## 2021-11-02 ENCOUNTER — Other Ambulatory Visit: Payer: Self-pay

## 2021-11-02 ENCOUNTER — Ambulatory Visit (INDEPENDENT_AMBULATORY_CARE_PROVIDER_SITE_OTHER): Payer: Medicare HMO | Admitting: Obstetrics & Gynecology

## 2021-11-02 DIAGNOSIS — N763 Subacute and chronic vulvitis: Secondary | ICD-10-CM | POA: Diagnosis not present

## 2021-11-02 MED ORDER — FLUCONAZOLE 100 MG PO TABS: 100.0000 mg | ORAL_TABLET | Freq: Every day | ORAL | 0 refills | Status: DC

## 2021-11-02 NOTE — Progress Notes (Signed)
Chief Complaint  Patient presents with   Vaginal Itching      71 y.o. [id number] No LMP recorded. Patient has had a hysterectomy. The current method of family planning is status post hysterectomy.  Outpatient Encounter Medications as of 11/02/2021  Medication Sig   albuterol (VENTOLIN HFA) 108 (90 Base) MCG/ACT inhaler Inhale 2 puffs into the lungs every 4 (four) hours as needed for wheezing or shortness of breath.   aspirin-acetaminophen-caffeine (EXCEDRIN MIGRAINE) 250-250-65 MG tablet Take 2 tablets by mouth daily as needed for headache. Up to 4 times a week   Benzocaine (BOIL-EASE EX) Apply 1 application topically daily as needed (Boil).   carbamide peroxide (DEBROX) 6.5 % OTIC solution Place 5 drops into both ears daily as needed (ears).   Cholecalciferol (VITAMIN D3) 50 MCG (2000 UT) TABS Take 8,000 Units by mouth daily.   colestipol (COLESTID) 1 g tablet TAKE 3 TABLETS BY MOUTH ONCE DAILY. DO NOT TAKE WITHIN TWO HOURS OF OTHER MEDS.   diazepam (VALIUM) 5 MG tablet Take 2.5 mg by mouth at bedtime. 1/2 tab qhs   diclofenac Sodium (VOLTAREN) 1 % GEL Apply topically as needed.   empagliflozin (JARDIANCE) 10 MG TABS tablet Take by mouth daily.   Ferrous Sulfate (IRON) 325 (65 Fe) MG TABS Take 1 tablet by mouth daily.   fluconazole (DIFLUCAN) 100 MG tablet Take 1 tablet (100 mg total) by mouth daily.   Fluticasone-Umeclidin-Vilant (TRELEGY ELLIPTA) 100-62.5-25 MCG/ACT AEPB 1 puff   furosemide (LASIX) 20 MG tablet Take 20 mg by mouth daily.   guaiFENesin-dextromethorphan (ROBITUSSIN DM) 100-10 MG/5ML syrup Take 5 mLs by mouth every 4 (four) hours as needed for cough.   hydrocortisone cream 1 % Apply 1 application topically daily as needed for itching.   Hydrocortisone, Perianal, (PROCTO-PAK) 1 % CREA Apply anorectally twice daily for two weeks.   levothyroxine (SYNTHROID) 125 MCG tablet 1 tablet in the morning on an empty stomach   megestrol (MEGACE) 40 MG tablet 20 mg.   metFORMIN  (GLUCOPHAGE) 500 MG tablet    MODERNA COVID-19 BIVAL BOOSTER 50 MCG/0.5ML injection    neomycin-bacitracin-polymyxin (NEOSPORIN) OINT Apply 1 application topically daily as needed for irritation or wound care.   oxyCODONE-acetaminophen (PERCOCET/ROXICET) 5-325 MG tablet Take 0.5 tablets by mouth 2 (two) times daily as needed for severe pain.   OXYGEN 2L at home   pantoprazole (PROTONIX) 40 MG tablet Take 40 mg by mouth daily.   PARoxetine (PAXIL) 20 MG tablet Take 20 mg by mouth daily.   predniSONE (DELTASONE) 50 MG tablet Take one tablet by mouth 13 hours, on tablet 7 hours and 1 tablet one hour prior to CT scan. Take benaddryl 50 mg onr hour prior to scan.   rosuvastatin (CRESTOR) 20 MG tablet Take 20 mg by mouth daily as needed.   traZODone (DESYREL) 150 MG tablet Take 75 mg by mouth at bedtime.   [DISCONTINUED] FUROSEMIDE PO Take 5 mg by mouth. One tablet every other day   No facility-administered encounter medications on file as of 11/02/2021.    Subjective Pt with vaginal itching for 3 weeks No recent antibiotics Some urinary incontinence and uses pads Past Medical History:  Diagnosis Date   Acute on chronic diastolic CHF (congestive heart failure) ([place]) 02/28/2021   Allergic rhinitis    Amputation of hand, right (HCC)    traumatic   Anemia    Anxiety    ASCVD (arteriosclerotic cardiovascular disease)    MI in 96 requiring  BMS CX; DES to M1 in 2000;normal coronary angiography in 2004   Cholelithiasis    COPD (chronic obstructive pulmonary disease) (HCC)    DDD (degenerative disc disease), lumbar    Depression    Diabetes mellitus    Type II   Diarrhea    DVT (deep venous thrombosis) ([place])    patient said no   GERD (gastroesophageal reflux disease)    Headache    migraine   History of kidney stones    2006   History of radiation therapy 06/28/2020-08/08/2020   right Lung; Dr. [name]   History of radiation therapy 03/22/2021   brain 03/02/2021-03/22/2021   Dr [name]   Hyperlipidemia    Hypertension    Hypotension    Hypothyroidism    Low back pain    Myocardial infarction [facility]) 01/1995   Nephrolithiasis 2006   stone extraction    Panic attacks    Peripheral neuropathy    Peripheral vascular disease (HCC)    legs   Pneumonia 12/22/2019   Sciatic pain    right   Small cell lung cancer ([place])    Tobacco abuse    Tremor     Past Surgical History:  Procedure Laterality Date   AGILE CAPSULE N/A 03/07/2020   Procedure: AGILE CAPSULE;  Surgeon: Daneil Dolin, MD;  [facility];  Service: Endoscopy;  Laterality: N/A;  7:30am   BACK SURGERY     fusion   BRONCHIAL NEEDLE ASPIRATION BIOPSY N/A 05/20/2020   Procedure: BRONCHIAL NEEDLE ASPIRATION BIOPSIES;  Surgeon: Collene Gobble, MD;  Location: [facility];  Service: Pulmonary;  Laterality: N/A;   CHOLECYSTECTOMY     COLONOSCOPY  01/2009   [id number] rectum/repeat in 5 yrs   COLONOSCOPY N/A 04/29/2014   Dr.Rourk- attempted/incomplete colonoscopy. inadequate prep   COLONOSCOPY N/A 05/27/2014   TTS:[id number] coli. Colonic polyps-removed as described above.Status post segmental biopsy. single tubular adenoma and random colon bx neg   COLONOSCOPY WITH PROPOFOL N/A 12/23/2019   Procedure: COLONOSCOPY WITH PROPOFOL;  Surgeon: Ronnette Juniper, MD;  Location: [place];  Service: Gastroenterology;  Laterality: N/A;   DILATION AND CURETTAGE OF UTERUS  1974   ESOPHAGOGASTRODUODENOSCOPY  05/2010   Dr. Tessie Fass, erosion. 41F dilation   ESOPHAGOGASTRODUODENOSCOPY N/A 04/29/2014   Dr.Rourk- normal esophagus s/p passage of maloney dilator. small hiatal hernia- bx= chronic inflammation.   ESOPHAGOGASTRODUODENOSCOPY (EGD) WITH PROPOFOL N/A 12/23/2019   Procedure: ESOPHAGOGASTRODUODENOSCOPY (EGD) WITH PROPOFOL;  Surgeon: Ronnette Juniper, MD;  Location: [place];  Service: Gastroenterology;  Laterality: N/A;   GIVENS CAPSULE STUDY N/A 03/22/2020   Procedure: GIVENS CAPSULE STUDY;  Surgeon:  Daneil Dolin, MD;  [facility];  Service: Endoscopy;  Laterality: N/A;  7:30am   HEMOSTASIS CLIP PLACEMENT  12/23/2019   Procedure: HEMOSTASIS CLIP PLACEMENT;  Surgeon: Ronnette Juniper, MD;  Location: [place];  Service: Gastroenterology;;   HOT HEMOSTASIS N/A 12/23/2019   Procedure: HOT HEMOSTASIS (ARGON PLASMA COAGULATION/BICAP);  Surgeon: Ronnette Juniper, MD;  Location: [place];  Service: Gastroenterology;  Laterality: N/A;   KNEE SURGERY Left    LEFT HEART CATH AND CORONARY ANGIOGRAPHY N/A 12/24/2019   Procedure: LEFT HEART CATH AND CORONARY ANGIOGRAPHY;  Surgeon: Nigel Mormon, MD;  Location: [place] [facility];  Service: Cardiovascular;  Laterality: N/A;   MALONEY DILATION N/A 04/29/2014   Procedure: Venia Minks DILATION;  Surgeon: Daneil Dolin, MD;  [facility];  Service: Endoscopy;  Laterality: N/A;   PARTIAL HYSTERECTOMY  1978   POLYPECTOMY  12/23/2019   Procedure: POLYPECTOMY;  Surgeon: Ronnette Juniper, MD;  Location[facility];  Service: Gastroenterology;;   SHOULDER SURGERY Left    Left shoulder for RTC;left arm surgery '98/left hand surgery 2001   TOTAL ABDOMINAL HYSTERECTOMY W/ BILATERAL SALPINGOOPHORECTOMY  1443   UMBILICAL HERNIA REPAIR  2008   VIDEO BRONCHOSCOPY WITH ENDOBRONCHIAL ULTRASOUND N/A 05/20/2020   Procedure: VIDEO BRONCHOSCOPY WITH ENDOBRONCHIAL ULTRASOUND;  Surgeon: Collene Gobble, MD;  Location: [place] [facility];  Service: Pulmonary;  Laterality: N/A;    OB History     Gravida  3   Para  2   Term  2   Preterm      AB  1   Living  2      SAB  1   IAB      Ectopic      Multiple      Live Births              Allergies  Allergen Reactions   Bee Venom Shortness Of Breath and Swelling   Iohexol Shortness Of Breath and Other (See Comments)    CHEST TIGHTNESS and BREATHING PROBLEMS- NEEDS PRE-MEDS 13 hours beforehand-  premeds given 06/08/2020 without incident    Tape Other (See Comments)    Tears the skin!!    Betadine [Povidone Iodine] Rash   Ciprofloxacin Rash   Latex Rash and Other (See Comments)    NO latex gloves!!    Penicillins Swelling    Did it involve swelling of the face/tongue/throat, SOB, or low BP? Yes Did it involve sudden or severe rash/hives, skin peeling, or any reaction on the inside of your mouth or nose? No Did you need to seek medical attention at a hospital or doctor's office? Yes When did it last happen?      1996 If all above answers are "NO", may proceed with cephalosporin use.   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    Employer: UNEMPLOYED  Tobacco Use   Smoking status: Former    Packs/day: 0.50    Years: 40.00    Pack years: 20.00    Types: Cigarettes    Quit date: 08/30/2017    Years since quitting: 4.1   Smokeless tobacco: Never  Vaping Use   Vaping Use: Never used  Substance and Sexual Activity   Alcohol use: No   Drug use: No   Sexual activity: Not Currently    Birth control/protection: Surgical    Comment: hyst  Other Topics Concern   Not on file  Social History Narrative   Married x3Lives with husbandGED in 2005   Left handed   Drinks caffeine   One story home   Social Determinants of Health   Financial Resource Strain: Not on file  Food Insecurity: Not on file  Transportation Needs: Not on file  Physical Activity: Not on file  Stress: Not on file  Social Connections: Not on file    Family History  Problem Relation Age of Onset   Depression Mother    Bipolar disorder Mother    Dementia Mother    Pulmonary fibrosis Mother    Alcohol abuse Father  Aneurysm Father        deceased age 41, brain   Colon cancer Paternal Grandfather        age greater than  20   Healthy Child     Medications:       Current Outpatient Medications:    albuterol (VENTOLIN HFA) 108 (90 Base) MCG/ACT inhaler, Inhale 2 puffs into the lungs every 4 (four) hours as needed for wheezing or shortness of breath., Disp: 1 each, Rfl: 3   aspirin-acetaminophen-caffeine (EXCEDRIN MIGRAINE) 250-250-65 MG tablet, Take 2 tablets by mouth daily as needed for headache. Up to 4 times a week, Disp: , Rfl:    Benzocaine (BOIL-EASE EX), Apply 1 application topically daily as needed (Boil)., Disp: , Rfl:    carbamide peroxide (DEBROX) 6.5 % OTIC solution, Place 5 drops into both ears daily as needed (ears)., Disp: , Rfl:    Cholecalciferol (VITAMIN D3) 50 MCG (2000 UT) TABS, Take 8,000 Units by mouth daily., Disp: , Rfl:    colestipol (COLESTID) 1 g tablet, TAKE 3 TABLETS BY MOUTH ONCE DAILY. DO NOT TAKE WITHIN TWO HOURS OF OTHER MEDS., Disp: 90 tablet, Rfl: 3   diazepam (VALIUM) 5 MG tablet, Take 2.5 mg by mouth at bedtime. 1/2 tab qhs, Disp: , Rfl:    diclofenac Sodium (VOLTAREN) 1 % GEL, Apply topically as needed., Disp: , Rfl:    empagliflozin (JARDIANCE) 10 MG TABS tablet, Take by mouth daily., Disp: , Rfl:    Ferrous Sulfate (IRON) 325 (65 Fe) MG TABS, Take 1 tablet by mouth daily., Disp: , Rfl:    fluconazole (DIFLUCAN) 100 MG tablet, Take 1 tablet (100 mg total) by mouth daily., Disp: 7 tablet, Rfl: 0   Fluticasone-Umeclidin-Vilant (TRELEGY ELLIPTA) 100-62.5-25 MCG/ACT AEPB, 1 puff, Disp: , Rfl:    furosemide (LASIX) 20 MG tablet, Take 20 mg by mouth daily., Disp: , Rfl:    guaiFENesin-dextromethorphan (ROBITUSSIN DM) 100-10 MG/5ML syrup, Take 5 mLs by mouth every 4 (four) hours as needed for cough., Disp: , Rfl:    hydrocortisone cream 1 %, Apply 1 application topically daily as needed for itching., Disp: , Rfl:    Hydrocortisone, Perianal, (PROCTO-PAK) 1 % CREA, Apply anorectally twice daily for two weeks., Disp: 28 g, Rfl: 0   levothyroxine (SYNTHROID) 125 MCG tablet, 1 tablet  in the morning on an empty stomach, Disp: , Rfl:    megestrol (MEGACE) 40 MG tablet, 20 mg., Disp: , Rfl:    metFORMIN (GLUCOPHAGE) 500 MG tablet, , Disp: , Rfl:    MODERNA COVID-19 BIVAL BOOSTER 50 MCG/0.5ML injection, , Disp: , Rfl:    neomycin-bacitracin-polymyxin (NEOSPORIN) OINT, Apply 1 application topically daily as needed for irritation or wound care., Disp: , Rfl:    oxyCODONE-acetaminophen (PERCOCET/ROXICET) 5-325 MG tablet, Take 0.5 tablets by mouth 2 (two) times daily as needed for severe pain., Disp: , Rfl:    OXYGEN, 2L at home, Disp: , Rfl:    pantoprazole (PROTONIX) 40 MG tablet, Take 40 mg by mouth daily., Disp: , Rfl:    PARoxetine (PAXIL) 20 MG tablet, Take 20 mg by mouth daily., Disp: , Rfl:    predniSONE (DELTASONE) 50 MG tablet, Take one tablet by mouth 13 hours, on tablet 7 hours and 1 tablet one hour prior to CT scan. Take benaddryl 50 mg onr hour prior to scan., Disp: 3 tablet, Rfl: 5   rosuvastatin (CRESTOR) 20 MG tablet, Take 20 mg by mouth daily as needed., Disp: , Rfl:  traZODone (DESYREL) 150 MG tablet, Take 75 mg by mouth at bedtime., Disp: , Rfl:   Objective There were no vitals taken for this visit.  General WDWN female NAD Vulva:  normal appearing vulva with no masses, tenderness or lesions, erythema on both sides consistewnt with chronic moisture/yeast vulvitis Vagina:  normal mucosa, no discharge Painted with gentian violet    Pertinent ROS No burning with urination, frequency or urgency No nausea, vomiting or diarrhea Nor fever chills or other constitutional symptoms   Labs or studies     Impression Diagnoses this Encounter::   ICD-10-CM   1. Chronic vulvitis, pad/moisture related  N76.3       Established relevant diagnosis(es):   Plan/Recommendations: Meds ordered this encounter  Medications   fluconazole (DIFLUCAN) 100 MG tablet    Sig: Take 1 tablet (100 mg total) by mouth daily.    Dispense:  7 tablet    Refill:  0     Labs or Scans Ordered: No orders of the defined types were placed in this encounter.   Management:: GV placed Diflucan 100 mg x 7 days Use Fanny cream chronically  Follow up Return if symptoms worsen or fail to improve.      All questions were answered.

## 2021-11-09 ENCOUNTER — Other Ambulatory Visit: Payer: Self-pay | Admitting: Obstetrics & Gynecology

## 2021-11-10 ENCOUNTER — Other Ambulatory Visit: Payer: Self-pay

## 2021-11-10 ENCOUNTER — Ambulatory Visit (HOSPITAL_COMMUNITY)
Admission: RE | Admit: 2021-11-10 | Discharge: 2021-11-10 | Disposition: A | Discharge status: Home / Self Care | Payer: Medicare HMO | Source: Ambulatory Visit | Attending: Radiation Oncology | Admitting: Radiation Oncology

## 2021-11-10 DIAGNOSIS — G319 Degenerative disease of nervous system, unspecified: Secondary | ICD-10-CM | POA: Diagnosis not present

## 2021-11-10 DIAGNOSIS — G9389 Other specified disorders of brain: Secondary | ICD-10-CM | POA: Diagnosis not present

## 2021-11-10 DIAGNOSIS — C7931 Secondary malignant neoplasm of brain: Secondary | ICD-10-CM | POA: Diagnosis not present

## 2021-11-10 MED ORDER — GADOBUTROL 1 MMOL/ML IV SOLN
6.0000 mL | Freq: Once | INTRAVENOUS | Status: AC | PRN
  Administered 2021-11-10: 6 mL via INTRAVENOUS

## 2021-11-12 NOTE — Progress Notes (Signed)
Radiation Oncology         [phone] ________________________________  Name: [name] MRN: [id number]  Date: 11/13/2021  DOB: 09-23-1951  Follow-Up Visit Note  CC: [name], MD  [name], MD    ICD-10-CM   1. 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 Since her last visit in October, CT of the chest abdomen and pelvis on 10/20/21 showed no findings concerning for disease progression.   The patient followed up with Dr. [name] on 10/23/21. During which time, the patient reported feeling well other than occasional dizzy spells. She denied any headache, visual changes, chest pain, SOB, or hemoptysis. She will continue to follow up with Dr. [name] bi-annually, and repeat imaging every 6 months .            MRI of the brain on 11/10/21 demonstrated the 6 mm left cerebellar metastasis as unchanged in the interval, and progressive post radiation changes in the cerebral white matter. No evidence of new intracranial metastases were appreciated otherwise.          She denies any headaches or visual problems.  She reports improvement in her memory issues.  She occasionally will have some dizziness particularly when awakening in the morning.  This seems to be a chronic  problem for her.  She did have 2 episodes where she fell but did not get injured.           Allergies:  is allergic to bee venom, iohexol, tape, betadine [povidone iodine], ciprofloxacin, latex, penicillins, and povidone-iodine.  Meds: Current Outpatient Medications  Medication Sig Dispense Refill   albuterol (VENTOLIN HFA) 108 (90 Base) MCG/ACT inhaler Inhale 2 puffs into the lungs every 4 (four) hours as needed for wheezing or shortness of breath. 1 each 3   aspirin-acetaminophen-caffeine (EXCEDRIN MIGRAINE) 250-250-65 MG tablet Take 2 tablets by mouth daily as needed for headache. Up to 4 times a week     Benzocaine (BOIL-EASE EX) Apply 1 application topically daily as needed (Boil).     carbamide peroxide (DEBROX) 6.5 % OTIC solution Place 5 drops into both ears daily as needed (ears).     Cholecalciferol (VITAMIN D3) 50 MCG (2000 UT) TABS Take 8,000 Units by mouth daily.     colestipol (COLESTID) 1 g tablet TAKE 3 TABLETS BY MOUTH ONCE DAILY. DO NOT TAKE WITHIN TWO HOURS OF OTHER MEDS. 90 tablet 3   diazepam (VALIUM) 5 MG tablet Take 2.5 mg by mouth at bedtime. 1/2 tab qhs     diclofenac Sodium (VOLTAREN) 1 % GEL Apply topically as needed.     empagliflozin (JARDIANCE) 10 MG TABS tablet Take by mouth daily.     Ferrous Sulfate (IRON) 325 (65 Fe) MG TABS Take 1 tablet by  mouth daily.     fluconazole (DIFLUCAN) 100 MG tablet Take 1 tablet (100 mg total) by mouth daily. 7 tablet 0   Fluticasone-Umeclidin-Vilant (TRELEGY ELLIPTA) 100-62.5-25 MCG/ACT AEPB 1 puff     furosemide (LASIX) 20 MG tablet Take 20 mg by mouth daily.     guaiFENesin-dextromethorphan (ROBITUSSIN DM) 100-10 MG/5ML syrup Take 5 mLs by mouth every 4 (four) hours as needed for cough.     hydrocortisone cream 1 % Apply 1 application topically daily as needed for itching.     Hydrocortisone, Perianal, (PROCTO-PAK) 1 % CREA Apply anorectally twice daily for two weeks. 28 g 0   levothyroxine (SYNTHROID) 125 MCG tablet 1 tablet  in the morning on an empty stomach     megestrol (MEGACE) 40 MG tablet 20 mg.     metFORMIN (GLUCOPHAGE) 500 MG tablet      MODERNA COVID-19 BIVAL BOOSTER 50 MCG/0.5ML injection      neomycin-bacitracin-polymyxin (NEOSPORIN) OINT Apply 1 application topically daily as needed for irritation or wound care.     oxyCODONE-acetaminophen (PERCOCET/ROXICET) 5-325 MG tablet Take 0.5 tablets by mouth 2 (two) times daily as needed for severe pain.     OXYGEN 2L at home     pantoprazole (PROTONIX) 40 MG tablet Take 40 mg by mouth daily.     PARoxetine (PAXIL) 20 MG tablet Take 20 mg by mouth daily.     predniSONE (DELTASONE) 50 MG tablet Take one tablet by mouth 13 hours, on tablet 7 hours and 1 tablet one hour prior to CT scan. Take benaddryl 50 mg onr hour prior to scan. 3 tablet 5   rosuvastatin (CRESTOR) 20 MG tablet Take 20 mg by mouth daily as needed.     traZODone (DESYREL) 150 MG tablet Take 75 mg by mouth at bedtime.     No current facility-administered medications for this encounter.    Physical Findings: The patient is in no acute distress. Patient is alert and oriented.  height is 5' 6"  (1.676 m) and weight is 127 lb (57.6 kg). Her temporal temperature is 96.7 F (35.9 C) (abnormal). Her blood pressure is 141/70 (abnormal) and her pulse is 98. Her respiration is 18 and oxygen saturation is 98%. .  No significant changes. Lungs are clear to auscultation bilaterally. Heart has regular rate and rhythm. No palpable cervical, supraclavicular, or axillary adenopathy. Abdomen soft, non-tender, normal bowel sounds.  The neurological examination is nonfocal.   Lab Findings: Lab Results  Component Value Date   WBC 4.9 10/20/2021   HGB 10.1 (L) 10/20/2021   HCT 31.1 (L) 10/20/2021   MCV 94.0 10/20/2021   PLT 215 10/20/2021    Radiographic Findings: CT Chest W Contrast  Result Date: 10/22/2021 CLINICAL DATA:  Restaging small cell lung cancer. EXAM: CT CHEST, ABDOMEN, AND PELVIS WITH CONTRAST  TECHNIQUE: Multidetector CT imaging of the chest, abdomen and pelvis was performed following the standard protocol during bolus administration of intravenous contrast. CONTRAST:  55m OMNIPAQUE IOHEXOL 350 MG/ML SOLN COMPARISON:  06/21/2021 FINDINGS: CT CHEST FINDINGS Cardiovascular: Normal heart size. No pericardial effusion. Aortic atherosclerosis with coronary artery calcifications. Mediastinum/Nodes: Normal appearance of the thyroid gland. The trachea appears patent and is midline. Normal appearance of the esophagus. No enlarged mediastinal or hilar lymph nodes. No axillary or supraclavicular adenopathy. Lungs/Pleura: Mild changes of paraseptal and centrilobular emphysema. No pleural effusion identified. No acute airspace consolidation. Changes of pulmonary fibrosis are again noted including diffuse peripheral predominant interstitial reticulation, septal thickening, and traction bronchiectasis. The  treated lesion within the right middle lobe measures 1.8 x 1.5 cm on today's study, image 70/4. On the previous exam this area was measured at 2.7 x 2.0 cm. Stable soft tissue thickening surrounding the adjacent hilar structures. Musculoskeletal: No chest wall mass or suspicious bone lesions identified. CT ABDOMEN PELVIS FINDINGS Hepatobiliary: Nodular contour the liver is again noted suggestive of cirrhosis. Status post cholecystectomy. No bile duct dilatation. Pancreas: Unremarkable. No pancreatic ductal dilatation or surrounding inflammatory changes. Spleen: Normal in size without focal abnormality. Adrenals/Urinary Tract: Adrenal glands are unremarkable. Kidneys are normal, without renal calculi, focal lesion, or hydronephrosis. Bladder is unremarkable. Stomach/Bowel: Stomach is within normal limits. Appendix appears normal. No evidence of bowel wall thickening, distention, or inflammatory changes. Vascular/Lymphatic: Aortic atherosclerosis. No aneurysm. No abdominopelvic adenopathy identified. Reproductive:  Status post hysterectomy. No adnexal masses. Other: No abdominal wall hernia or abnormality. No abdominopelvic ascites. Musculoskeletal: No acute or significant osseous findings. IMPRESSION: 1. Continued decrease in size of treated lesion within the right middle lobe. No new or progressive disease identified. 2. Morphologic features of the liver suggestive of cirrhosis. 3. Chronic interstitial lung disease is identified compatible with pulmonary fibrosis. More definitive characterization could be obtained with high-resolution CT of the chest if clinically indicated. 4. Coronary artery atherosclerotic calcifications. 5. Aortic Atherosclerosis (ICD10-I70.0) and Emphysema (ICD10-J43.9). Electronically Signed   By: Kerby Moors M.D.   On: 10/22/2021 10:48   MR Brain W Wo Contrast  Result Date: 11/13/2021 CLINICAL DATA:  Brain/CNS neoplasm, staging. Small-cell lung cancer. History of whole brain radiation therapy in 2022. EXAM: MRI HEAD WITHOUT AND WITH CONTRAST TECHNIQUE: Multiplanar, multiecho pulse sequences of the brain and surrounding structures were obtained without and with intravenous contrast. CONTRAST:  52m GADAVIST GADOBUTROL 1 MMOL/ML IV SOLN COMPARISON:  Head MRI 08/08/2021 FINDINGS: Brain: There is no evidence an acute infarct, midline shift, or extra-axial fluid collection. A chronic microhemorrhage in the cerebellar vermis is unchanged. There is mild cerebral atrophy. Patchy and confluent T2 hyperintensities in the cerebral white matter and pons have progressed and are nonspecific but likely reflect progressive post radiation changes superimposed on chronic small vessel ischemia. A 6 mm enhancing lesion peripherally in the left cerebellar hemisphere is unchanged. No new enhancing brain lesion is identified. Vascular: Major intracranial vascular flow voids are preserved. Skull and upper cervical spine: Unremarkable bone marrow signal. Sinuses/Orbits: Bilateral cataract extraction. Clear paranasal  sinuses. Small to moderate bilateral mastoid effusions. Other: None. IMPRESSION: 1. Unchanged 6 mm left cerebellar metastasis. 2. No evidence of new intracranial metastases. 3. Progressive post radiation changes in the cerebral white matter. Electronically Signed   By: ALogan BoresM.D.   On: 11/13/2021 09:18   CT Abdomen Pelvis W Contrast  Result Date: 10/22/2021 CLINICAL DATA:  Restaging small cell lung cancer. EXAM: CT CHEST, ABDOMEN, AND PELVIS WITH CONTRAST TECHNIQUE: Multidetector CT imaging of the chest, abdomen and pelvis was performed following the standard protocol during bolus administration of intravenous contrast. CONTRAST:  739mOMNIPAQUE IOHEXOL 350 MG/ML SOLN COMPARISON:  06/21/2021 FINDINGS: CT CHEST FINDINGS Cardiovascular: Normal heart size. No pericardial effusion. Aortic atherosclerosis with coronary artery calcifications. Mediastinum/Nodes: Normal appearance of the thyroid gland. The trachea appears patent and is midline. Normal appearance of the esophagus. No enlarged mediastinal or hilar lymph nodes. No axillary or supraclavicular adenopathy. Lungs/Pleura: Mild changes of paraseptal and centrilobular emphysema. No pleural effusion identified. No acute airspace consolidation. Changes of pulmonary fibrosis are again noted including diffuse peripheral predominant interstitial reticulation, septal thickening, and traction  bronchiectasis. The treated lesion within the right middle lobe measures 1.8 x 1.5 cm on today's study, image 70/4. On the previous exam this area was measured at 2.7 x 2.0 cm. Stable soft tissue thickening surrounding the adjacent hilar structures. Musculoskeletal: No chest wall mass or suspicious bone lesions identified. CT ABDOMEN PELVIS FINDINGS Hepatobiliary: Nodular contour the liver is again noted suggestive of cirrhosis. Status post cholecystectomy. No bile duct dilatation. Pancreas: Unremarkable. No pancreatic ductal dilatation or surrounding inflammatory changes.  Spleen: Normal in size without focal abnormality. Adrenals/Urinary Tract: Adrenal glands are unremarkable. Kidneys are normal, without renal calculi, focal lesion, or hydronephrosis. Bladder is unremarkable. Stomach/Bowel: Stomach is within normal limits. Appendix appears normal. No evidence of bowel wall thickening, distention, or inflammatory changes. Vascular/Lymphatic: Aortic atherosclerosis. No aneurysm. No abdominopelvic adenopathy identified. Reproductive: Status post hysterectomy. No adnexal masses. Other: No abdominal wall hernia or abnormality. No abdominopelvic ascites. Musculoskeletal: No acute or significant osseous findings. IMPRESSION: 1. Continued decrease in size of treated lesion within the right middle lobe. No new or progressive disease identified. 2. Morphologic features of the liver suggestive of cirrhosis. 3. Chronic interstitial lung disease is identified compatible with pulmonary fibrosis. More definitive characterization could be obtained with high-resolution CT of the chest if clinically indicated. 4. Coronary artery atherosclerotic calcifications. 5. Aortic Atherosclerosis (ICD10-I70.0) and Emphysema (ICD10-J43.9). Electronically Signed   By: Kerby Moors M.D.   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  No evidence of progression on recent brain MRI.  She reports developing a rash after her chest CT scan as well as her brain MRI and continues to have problems with this rash since her brain MRI.  She does not wish to receive IV contrast with her next MRI and so therefore a order without contrast.  She understands the sensitivity will Be as good on the scan.  Recommend we repeat the brain MRI in 3 months but she does not want to come that frequently now that she feels  well.  She agrees to an MRI of the brain and chest CT scan in 6 months.  Plan: MRI of the brain without contrast in 6 months.  Follow-up soon afterwards for clinical exam and to review  results of study.   20 minutes of total time was spent for this patient encounter, including preparation, face-to-face counseling with the patient and coordination of care, physical exam, and documentation of the encounter. ____________________________________  Blair Promise, PhD, MD  This document serves as a record of services personally performed by [name], MD. It was created on his behalf by Roney Mans, a trained medical scribe. The creation of this record is based on the scribe's personal observations and the provider's statements to them. This document has been checked and approved by the attending provider.

## 2021-11-13 ENCOUNTER — Encounter: Payer: Self-pay | Admitting: Radiation Oncology

## 2021-11-13 ENCOUNTER — Ambulatory Visit
Admission: RE | Admit: 2021-11-13 | Discharge: 2021-11-13 | Disposition: A | Discharge status: Home / Self Care | Payer: Medicare HMO | Source: Ambulatory Visit | Attending: Radiation Oncology | Admitting: Radiation Oncology

## 2021-11-13 DIAGNOSIS — J432 Centrilobular emphysema: Secondary | ICD-10-CM | POA: Diagnosis not present

## 2021-11-13 DIAGNOSIS — C7951 Secondary malignant neoplasm of bone: Secondary | ICD-10-CM | POA: Diagnosis not present

## 2021-11-13 DIAGNOSIS — R42 Dizziness and giddiness: Secondary | ICD-10-CM | POA: Insufficient documentation

## 2021-11-13 DIAGNOSIS — C7931 Secondary malignant neoplasm of brain: Secondary | ICD-10-CM | POA: Diagnosis not present

## 2021-11-13 DIAGNOSIS — Z08 Encounter for follow-up examination after completed treatment for malignant neoplasm: Secondary | ICD-10-CM | POA: Diagnosis not present

## 2021-11-13 DIAGNOSIS — Z923 Personal history of irradiation: Secondary | ICD-10-CM | POA: Diagnosis not present

## 2021-11-13 DIAGNOSIS — C342 Malignant neoplasm of middle lobe, bronchus or lung: Secondary | ICD-10-CM | POA: Diagnosis not present

## 2021-11-13 DIAGNOSIS — I700 Atherosclerosis of aorta: Secondary | ICD-10-CM | POA: Diagnosis not present

## 2021-11-13 DIAGNOSIS — Z79899 Other long term (current) drug therapy: Secondary | ICD-10-CM | POA: Diagnosis not present

## 2021-11-13 DIAGNOSIS — Z7984 Long term (current) use of oral hypoglycemic drugs: Secondary | ICD-10-CM | POA: Insufficient documentation

## 2021-11-13 DIAGNOSIS — R21 Rash and other nonspecific skin eruption: Secondary | ICD-10-CM | POA: Insufficient documentation

## 2021-11-13 IMAGING — MR MR HEAD WO/W CM
7 of 14 series · 22 of 48 positions shown · IV contrast (6.5 M GAD)
Comparison: Head CT 02/28/2021

CLINICAL DATA: Vertigo

EXAM:
MRI HEAD WITHOUT AND WITH CONTRAST
TECHNIQUE: Multiplanar, multiecho pulse sequences of the brain and surrounding
structures were obtained without and with intravenous contrast.
CONTRAST:  6.5mL GADAVIST GADOBUTROL 1 MMOL/ML IV SOLN

[Series 2: DWI · axial · 3.0mm · 0.94mm/px · z∈[-87,+67]mm · 6 of 105 slices shown (1 of 2)]
[im 1/105]
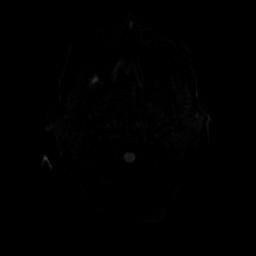
[im 21/105]
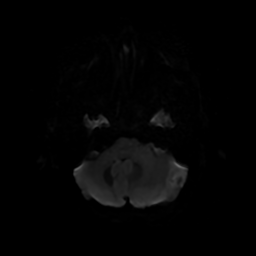
[im 42/105]
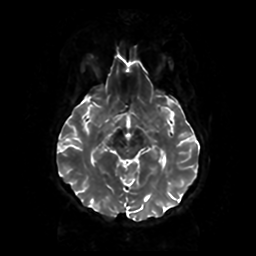
[im 63/105]
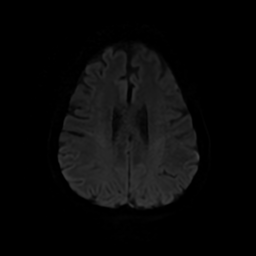
[im 84/105]
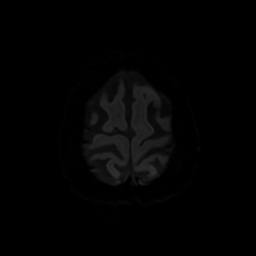
[im 105/105]
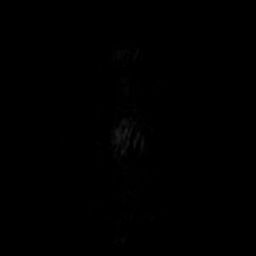

[Series 3: DWI · coronal · 4.0mm · 0.94mm/px · 5 of 74 slices shown (2 of 2)]
[im 1/74]
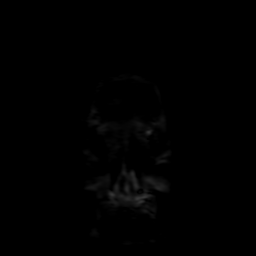
[im 19/74]
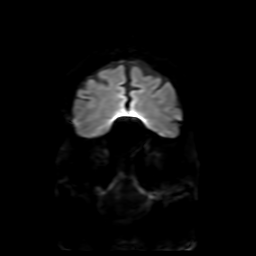
[im 37/74]
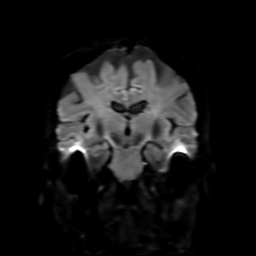
[im 55/74]
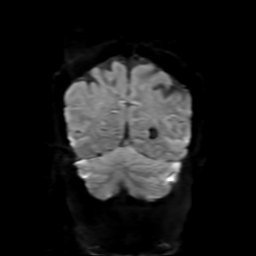
[im 74/74]
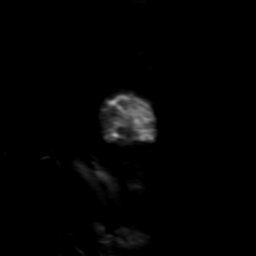

[Series 4: FLAIR · sagittal · 5.0mm · 0.23mm/px · 2 of 25 slices shown (1 of 2)]
[im 1/25]
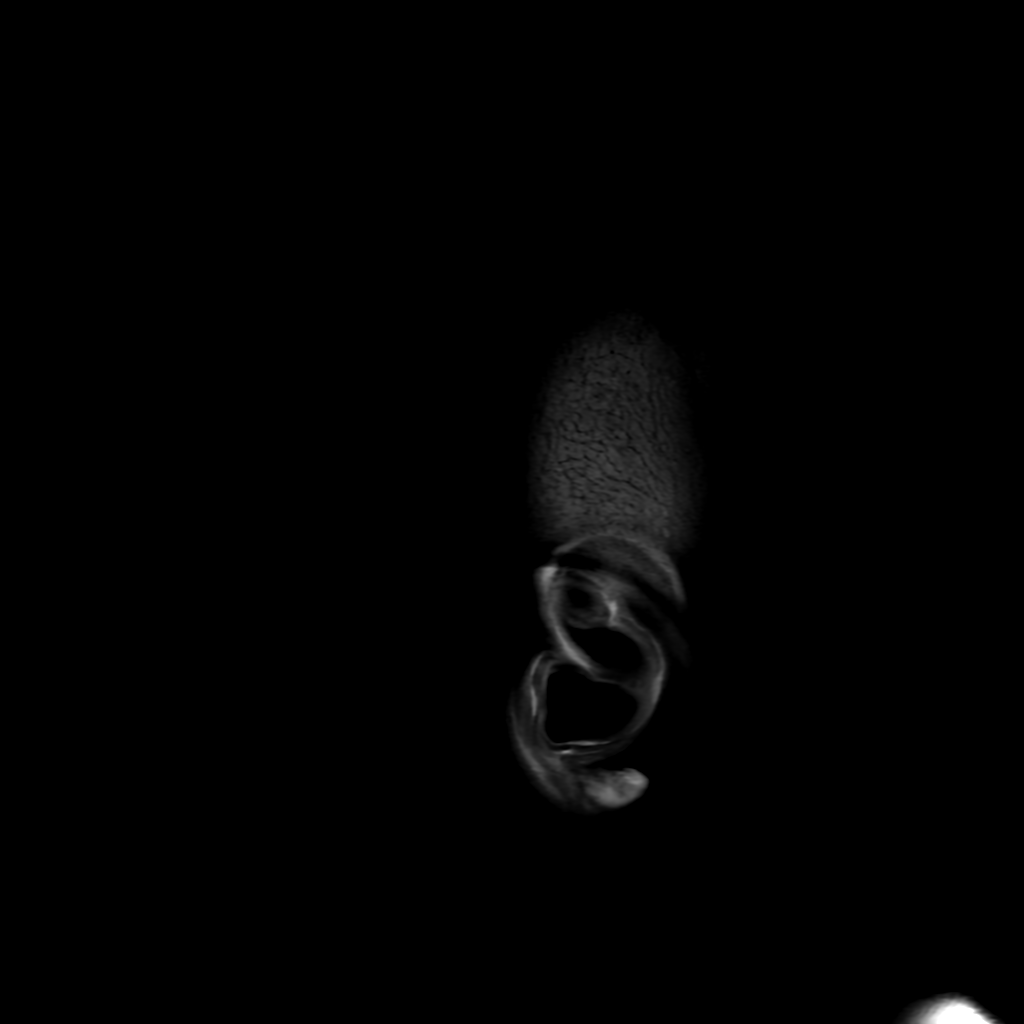
[im 25/25]
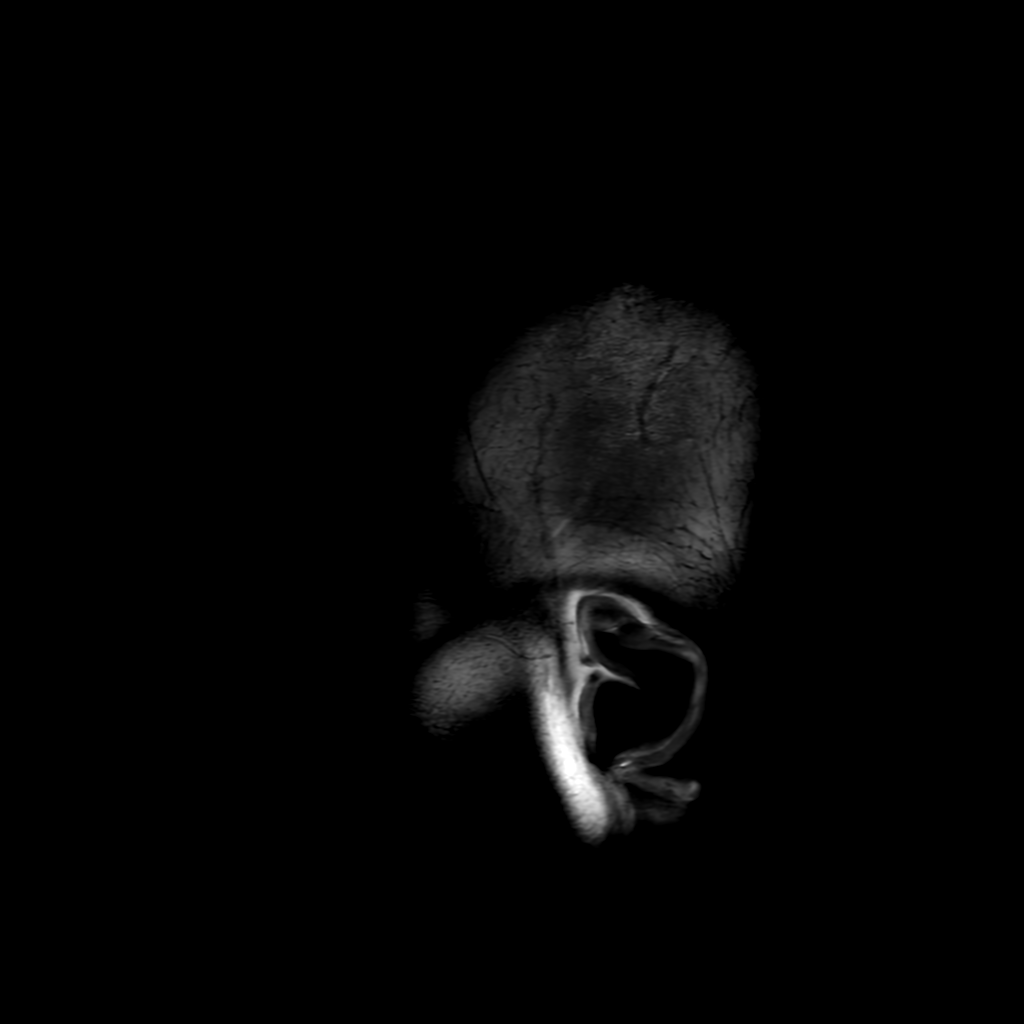

[Series 6: FLAIR · axial · 3.0mm · 0.45mm/px · z∈[-81,+74]mm · 2 of 27 slices shown (2 of 2)]
[im 1/27]
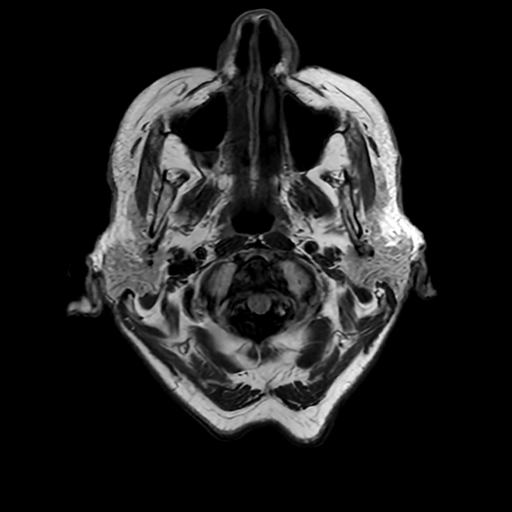
[im 27/27]
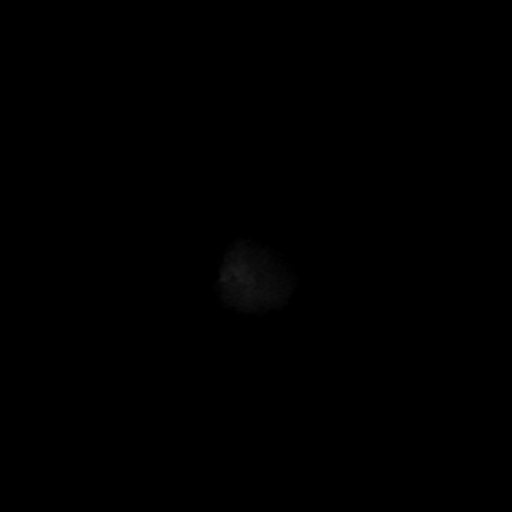

[Series 12: FLAIR post-contrast · sagittal · 5.0mm · 0.47mm/px · 2 of 25 slices shown]
[im 1/25]
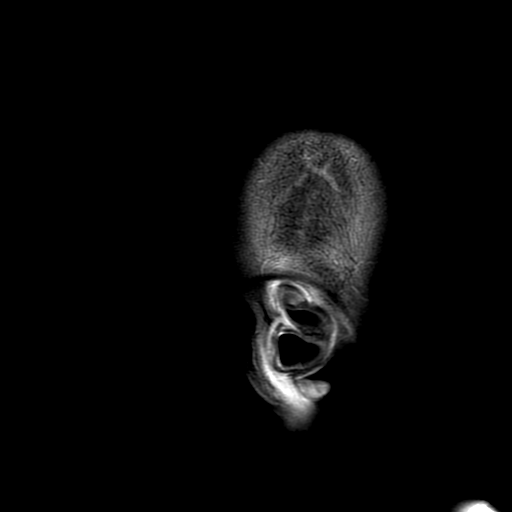
[im 25/25]
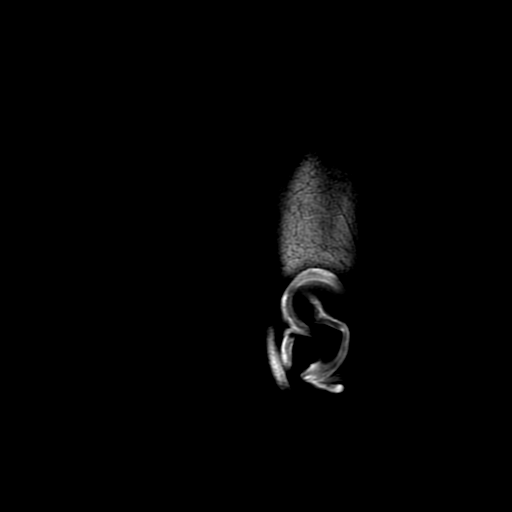

[Series 250: ADC · axial · 3.0mm · 0.94mm/px · z∈[-87,+67]mm · 3 of 53 slices shown (1 of 2)]
[im 1/53]
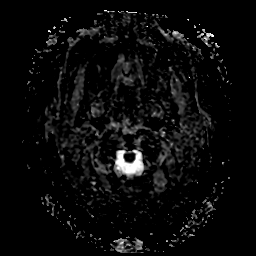
[im 27/53]
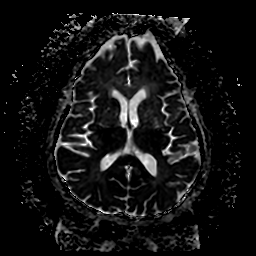
[im 53/53]
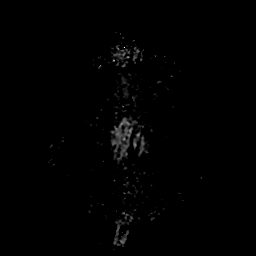

[Series 350: ADC · coronal · 4.0mm · 0.94mm/px · 2 of 37 slices shown (2 of 2)]
[im 1/37]
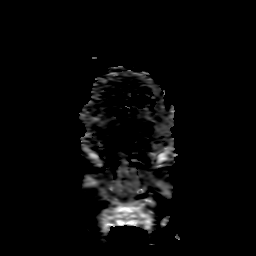
[im 37/37]
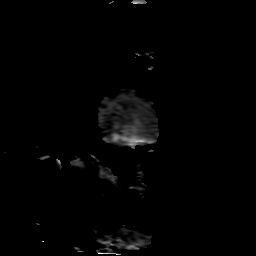

[22 of 48 positions shown; findings below may reference images not displayed]

FINDINGS: Brain: No acute infarct, mass effect or extra-axial collection. No
acute or chronic hemorrhage. There is multifocal hyperintense
T2-weighted signal within the white matter. Parenchymal volume and
CSF spaces are normal. Large amount of edema in the left cerebellar
hemisphere. 2.4 cm contrast-enhancing mass in the peripheral left
cerebellar hemisphere. The midline structures are normal.

Vascular: Major flow voids are preserved.

Skull and upper cervical spine: Normal calvarium and skull base.
Visualized upper cervical spine and soft tissues are normal.

Sinuses/Orbits:No paranasal sinus fluid levels or advanced mucosal
thickening. No mastoid or middle ear effusion. Normal orbits.
IMPRESSION: 1. 2.4 cm contrast-enhancing mass in the peripheral left cerebellar
hemisphere with large amount of edema. This is most consistent with
a solitary metastasis. No other lesions.
2. Findings of chronic microvascular ischemia.

## 2021-11-13 IMAGING — CT CT HEAD W/O CM
4 series · 15 of 47 positions shown, 17 images · non-contrast
Comparison: Most recent MRI 12/31/2020 CT 02/07/2015

CLINICAL DATA: Peripheral vertigo

EXAM:
CT HEAD WITHOUT CONTRAST
TECHNIQUE: Contiguous axial images were obtained from the base of the skull
through the vertex without intravenous contrast.

[Series 3: head bone · axial · 0.43mm/px · z∈[+258,+272]mm · 2 of 75 slices shown]
[im 8/75  bone]
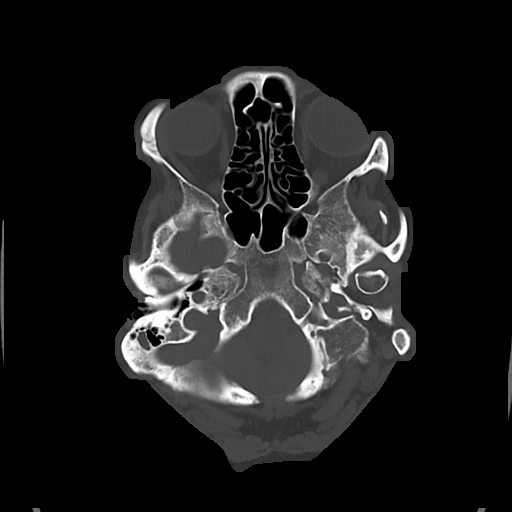
[im 15/75  bone]
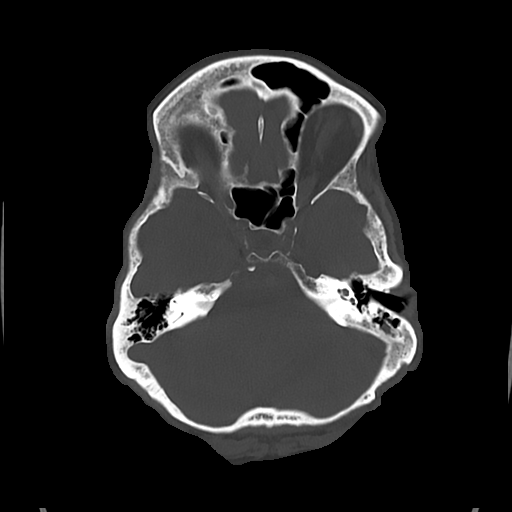

[Series 4: head wo · axial · 0.43mm/px · z∈[+259,+369]mm · 7 of 30 slices shown, 9 images]
[im 4/30  brain]
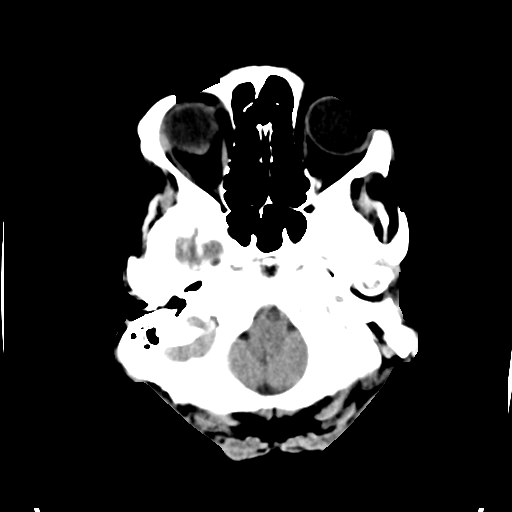
[im 4/30  bone]
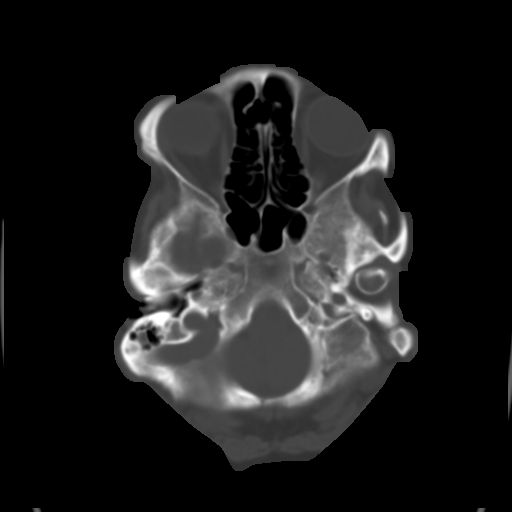
[im 8/30  brain]
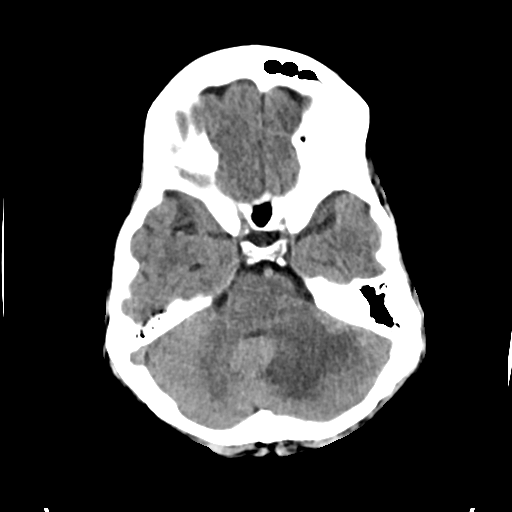
[im 11/30  brain]
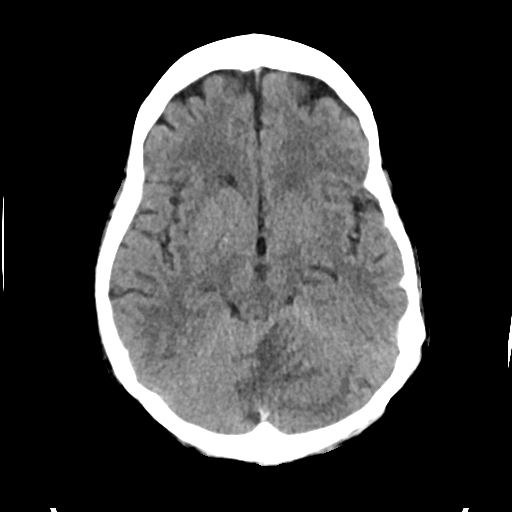
[im 15/30  brain]
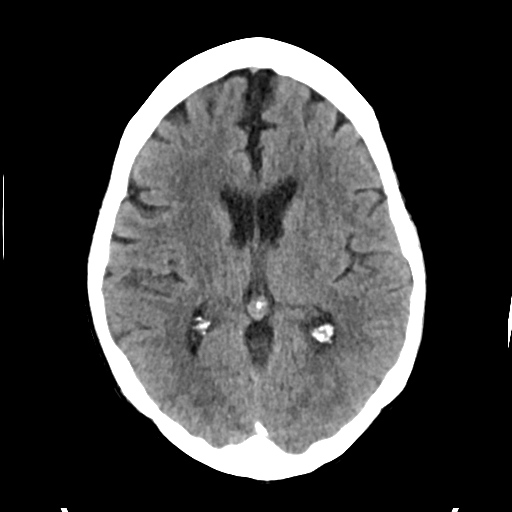
[im 19/30  brain]
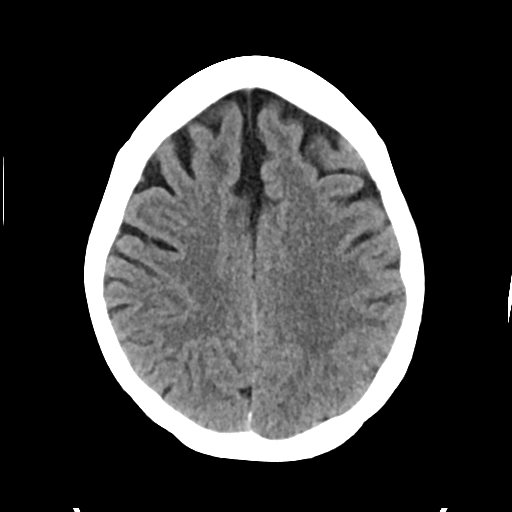
[im 19/30  bone]
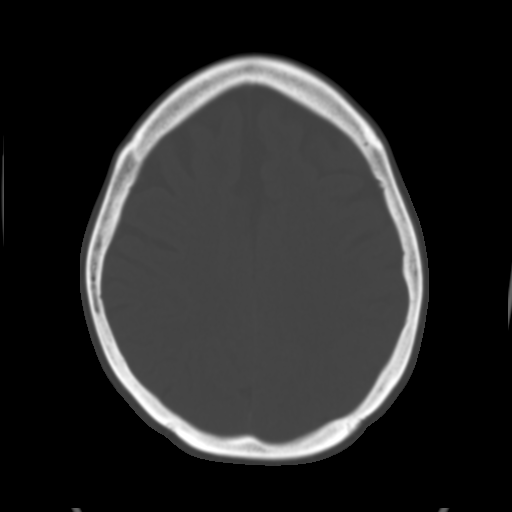
[im 22/30  brain]
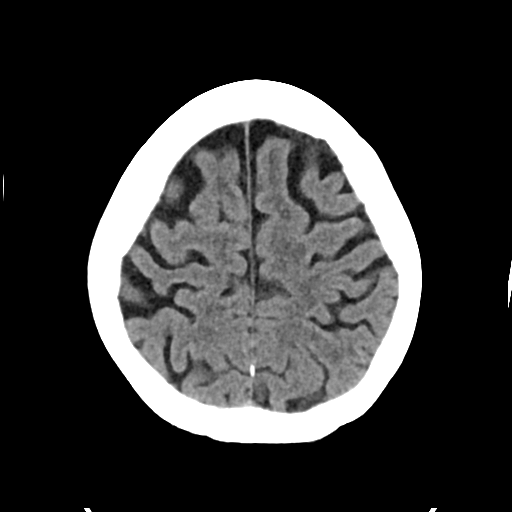
[im 26/30  brain]
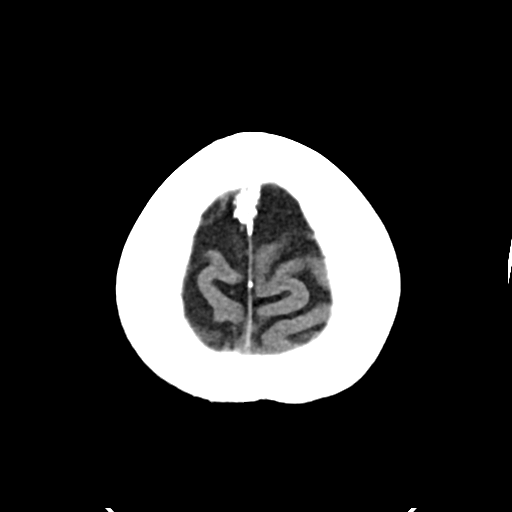

[Series 5: cor soft · coronal · 0.30mm/px · 3 of 68 slices shown]
[im 23/68  brain]
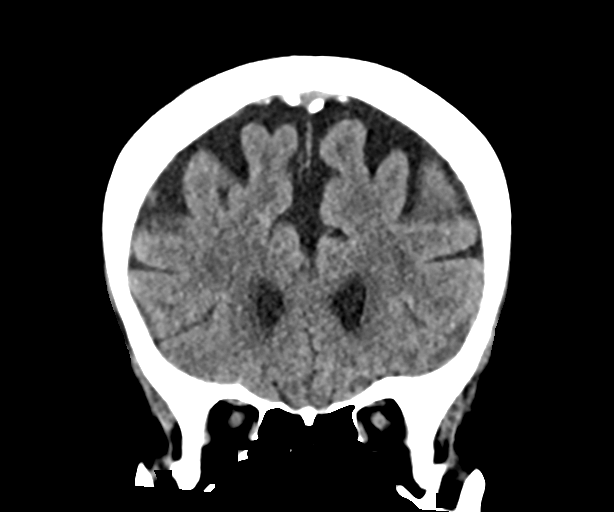
[im 30/68  brain]
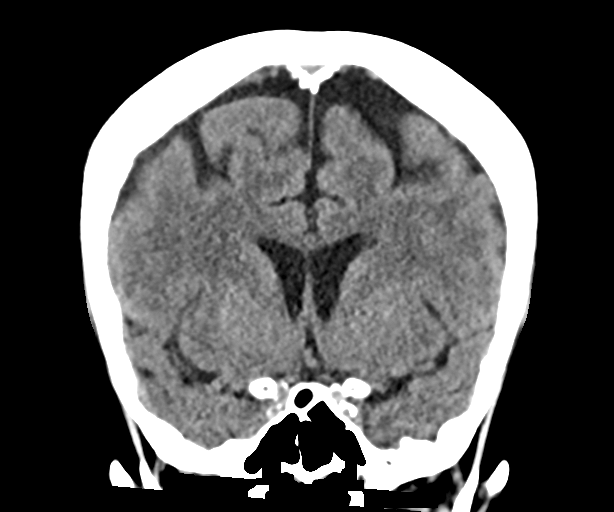
[im 38/68  brain]
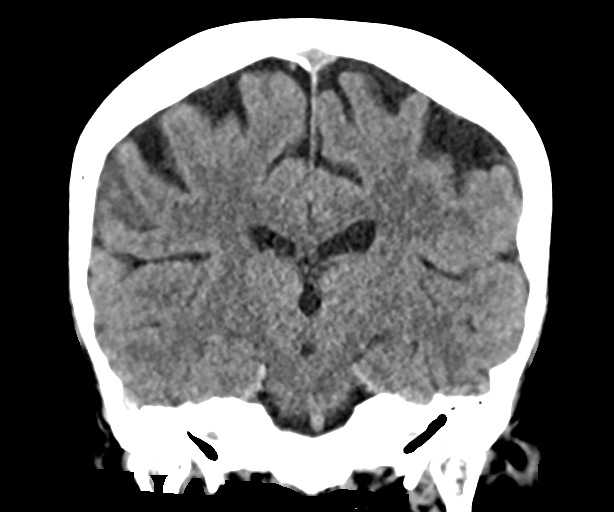

[Series 6: sag soft · sagittal · 0.30mm/px · 3 of 52 slices shown]
[im 18/52  brain]
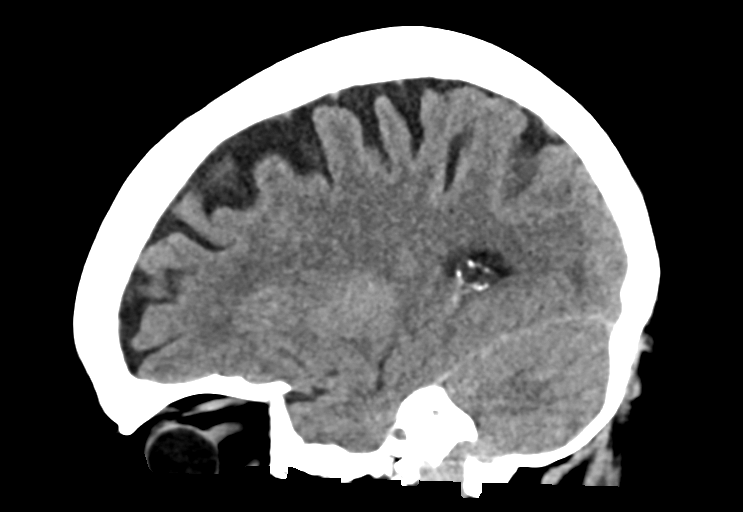
[im 26/52  brain]
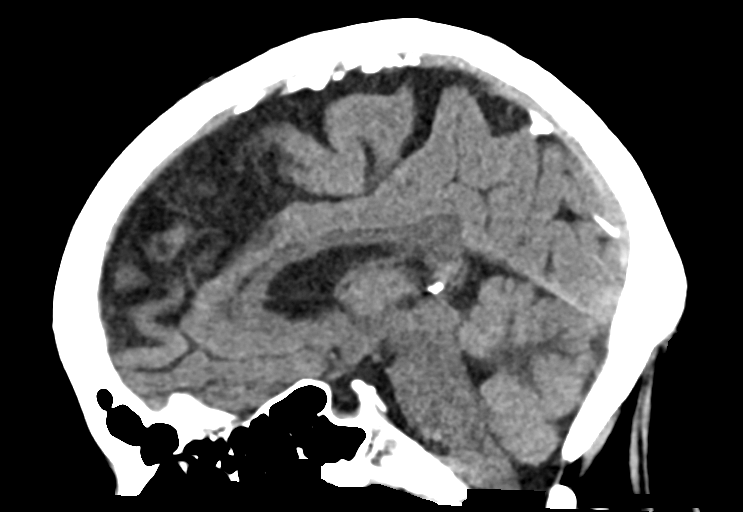
[im 35/52  brain]
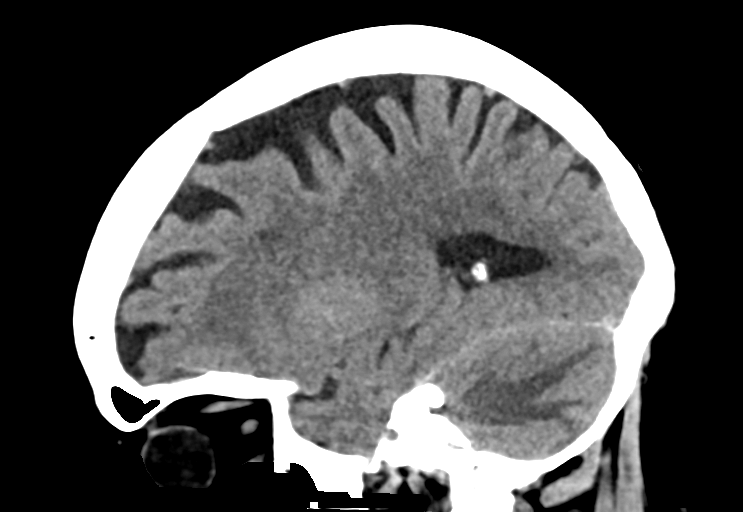

[15 of 47 positions shown; findings below may reference images not displayed]

FINDINGS: Brain: Marked hypoattenuation in the left cerebellar hemisphere with
a volume positive process partially effacing the fourth ventricle
extending from the midline cerebellum ([DATE]) concerning for enlarging
underlying metastatic lesion in this patient with history of small
cell lung cancer and previously demonstrated cerebellar metastatic
focus in this vicinity. No supratentorial mass is CT evident. No
evidence of acute infarction, hemorrhage, hydrocephalus. Insert
white matter Symmetric prominence of the remaining ventricles,
cisterns and sulci compatible with parenchymal volume loss.

Vascular: Atherosclerotic calcification of the carotid siphons. No
hyperdense vessel.

Skull: No calvarial fracture or suspicious osseous lesion. No scalp
swelling or hematoma.

Sinuses/Orbits: Paranasal sinuses and mastoid air cells are
predominantly clear. Prior right lens extraction. Orbital contents
are otherwise unremarkable.

Other: None.
IMPRESSION: Appearance highly concerning for recurrent and enlarging left
paramedian cerebellar metastasis with associated vasogenic edema.
Consider further characterization with a contrast enhanced MRI.

These results were called by telephone at the time of interpretation
on 02/28/2021 at [DATE] to provider JABLI SEDKI , who verbally
acknowledged these results.

## 2021-11-13 NOTE — Progress Notes (Signed)
[name] is here today for follow up post radiation to the brain.    They completed their radiation on: 03/22/21  Does the patient complain of any of the following: Headache: Patient denies recent headaches.  Visual Changes: No Hearing Changes: no Nausea: no Vomiting: no Balance or coordination issues:  yes,patient reports having 2 falls. Memory issues: Patient reports improvement with memory.   Is the patient currently on a Decadron regimen? : No  Patient reports having a reaction to  MRI contrast. Reports swelling and itching to left arm. Patient reports taking benadryl, which was mildly effective.   [name] is here today for follow up post radiation to the lung.  Lung Side: Right   Does the patient complain of any of the following: Pain: no  Shortness of breath w/wo exertion: No, patient continues to use 2 L 02 at night.  Cough: yes Hemoptysis: no Pain with swallowing: no Swallowing/choking concerns: at times. Appetite: Good  Energy Level: Continues to have mild fatigue.  Post radiation skin Changes: intact.     Additional comments if applicable:  Vitals:   26/37/85 1601  BP: (!) 141/70  Pulse: 98  Resp: 18  Temp: (!) 96.7 F (35.9 C)  TempSrc: Temporal  SpO2: 98%  Weight: 127 lb (57.6 kg)  Height: 5' 6"  (1.676 m)

## 2021-11-14 ENCOUNTER — Other Ambulatory Visit: Payer: Self-pay

## 2021-11-14 ENCOUNTER — Telehealth: Payer: Self-pay | Admitting: Pulmonary Disease

## 2021-11-14 DIAGNOSIS — J449 Chronic obstructive pulmonary disease, unspecified: Secondary | ICD-10-CM

## 2021-11-14 DIAGNOSIS — E782 Mixed hyperlipidemia: Secondary | ICD-10-CM | POA: Diagnosis not present

## 2021-11-14 DIAGNOSIS — I10 Essential (primary) hypertension: Secondary | ICD-10-CM | POA: Diagnosis not present

## 2021-11-14 MED ORDER — ALBUTEROL SULFATE (2.5 MG/3ML) 0.083% IN NEBU: 2.5000 mg | INHALATION_SOLUTION | Freq: Four times a day (QID) | RESPIRATORY_TRACT | 3 refills | Status: AC | PRN

## 2021-11-14 NOTE — Telephone Encounter (Signed)
Pt saw Dr. [name] and he noted wheezing yesterday. eresa stated Dr. [name] suggested a nebulizer  Dr. [name] suggested pt be seen sooner by Dr. [name]  but nothing available for [place] or [place].  Only wants to see Dr. [name] does not want to see any other providers or a NP.   Advised patients friend that we could keep her name in our box for any sooner appts.   Dr. [name] please advise if youre okay with patient waiting until end of March

## 2021-11-14 NOTE — Telephone Encounter (Signed)
Called and spoke to Russell (ok per dpr) she voiced understanding of double book for sooner appt and nebulizer/ solution. Nothing further needed.

## 2021-11-14 NOTE — Telephone Encounter (Signed)
Okay to double book her for visit with me in February either in [place], [place], or [place].  Can send order for home nebulizer, and send order for albuterol in nebulizer one vial q6h prn.

## 2021-11-15 ENCOUNTER — Telehealth: Payer: Self-pay | Admitting: Pulmonary Disease

## 2021-11-15 MED ORDER — TRELEGY ELLIPTA 100-62.5-25 MCG/ACT IN AEPB: 1.0000 | INHALATION_SPRAY | Freq: Every day | RESPIRATORY_TRACT | 0 refills | Status: DC

## 2021-11-15 NOTE — Telephone Encounter (Signed)
Called and spoke to patient. Let her know we do have 2 samples for her of trelegy 100. She states she will be by tomorrow morning to pick up. Nothing further needed.

## 2021-11-16 IMAGING — DX DG CHEST 1V PORT
1 series · 1 of 1 positions shown · non-contrast
Comparison: 02/28/2021

CLINICAL DATA: Pulmonary edema

EXAM:
PORTABLE CHEST 1 VIEW

[chest]
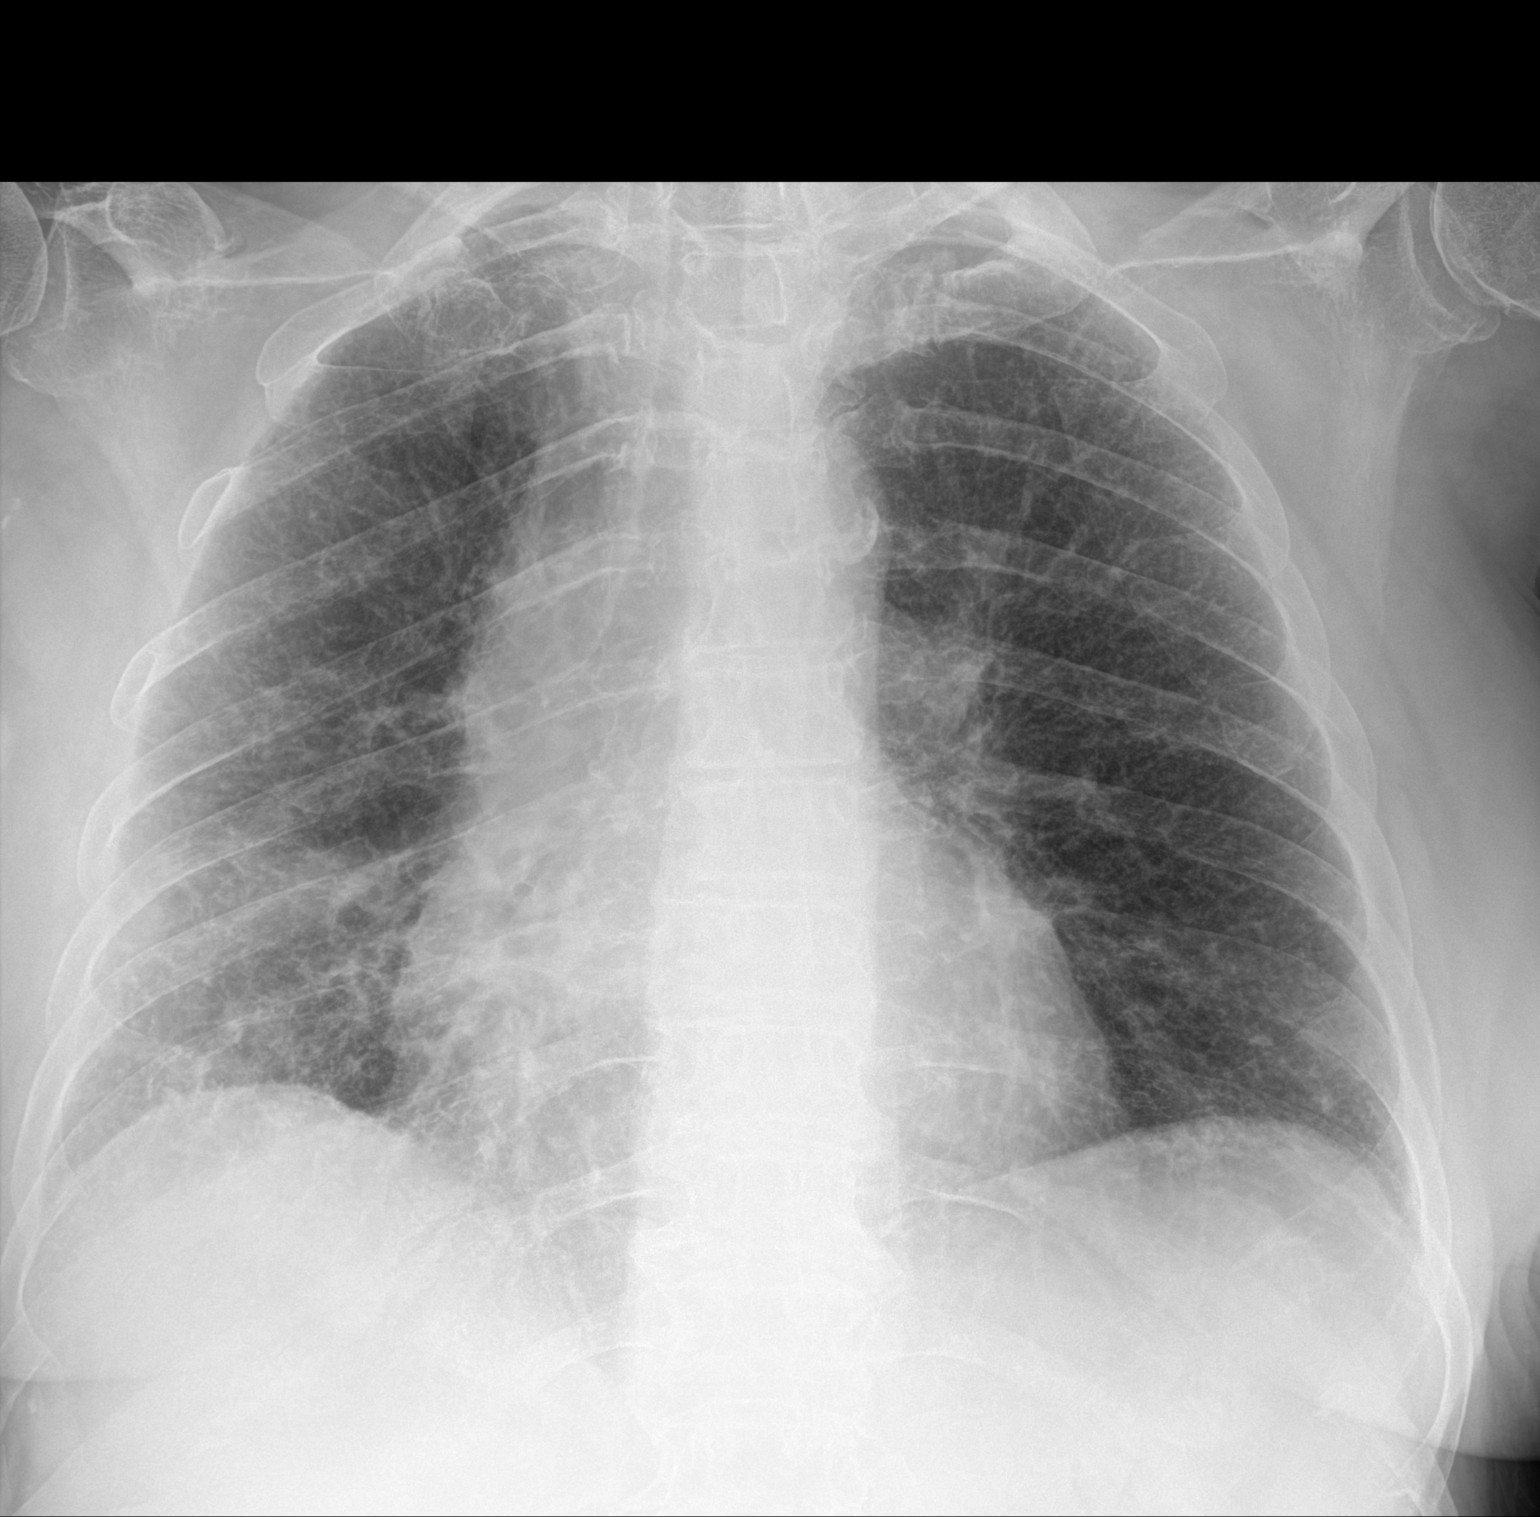

[1 of 1 positions shown; findings below may reference images not displayed]

FINDINGS: Asymmetric interstitial changes are again identified in keeping with
chronic underlying interstitial lung disease. More focal opacity
within the right lung base corresponds to the right basilar masslike
opacity noted on CT examination of 12/02/2020. No new focal
pulmonary infiltrate. No pneumothorax or pleural effusion. Cardiac
size within normal limits. Pulmonary vascularity is normal. No acute
bone abnormality.
IMPRESSION: Stable chronic interstitial changes in keeping with underlying
interstitial lung disease.

Vague opacity within the right lung base corresponding to known
pulmonary mass better seen on prior CT examination of 12/02/2020.

Normal pulmonary vascularity.

## 2021-11-21 ENCOUNTER — Telehealth: Payer: Self-pay | Admitting: Pulmonary Disease

## 2021-11-21 NOTE — Telephone Encounter (Signed)
ATC [name]. LMTCB if they still need help setting up nebulizer machine

## 2021-11-21 NOTE — Telephone Encounter (Signed)
ATC Teresa and patient. LMTCB

## 2021-11-22 ENCOUNTER — Telehealth: Payer: Self-pay | Admitting: Pulmonary Disease

## 2021-11-22 NOTE — Telephone Encounter (Signed)
Called and spoke to Pattison (ok per dpr) I advised anytime between 2-4:30 when am clinic is over will be fine for patient to come in to be shown how to use nebulizer. [name] states she will come either today or tomorrow in the afternoon. Nothing further needed.

## 2021-11-22 NOTE — Telephone Encounter (Signed)
Addressed in separate encounter.

## 2021-11-23 DIAGNOSIS — R809 Proteinuria, unspecified: Secondary | ICD-10-CM | POA: Diagnosis not present

## 2021-11-23 DIAGNOSIS — E039 Hypothyroidism, unspecified: Secondary | ICD-10-CM | POA: Diagnosis not present

## 2021-11-23 DIAGNOSIS — D509 Iron deficiency anemia, unspecified: Secondary | ICD-10-CM | POA: Diagnosis not present

## 2021-11-23 DIAGNOSIS — R799 Abnormal finding of blood chemistry, unspecified: Secondary | ICD-10-CM | POA: Diagnosis not present

## 2021-11-23 DIAGNOSIS — E1165 Type 2 diabetes mellitus with hyperglycemia: Secondary | ICD-10-CM | POA: Diagnosis not present

## 2021-11-23 DIAGNOSIS — N1832 Chronic kidney disease, stage 3b: Secondary | ICD-10-CM | POA: Diagnosis not present

## 2021-11-23 DIAGNOSIS — E782 Mixed hyperlipidemia: Secondary | ICD-10-CM | POA: Diagnosis not present

## 2021-11-28 ENCOUNTER — Telehealth: Payer: Self-pay

## 2021-11-28 DIAGNOSIS — F339 Major depressive disorder, recurrent, unspecified: Secondary | ICD-10-CM | POA: Diagnosis not present

## 2021-11-28 DIAGNOSIS — F411 Generalized anxiety disorder: Secondary | ICD-10-CM | POA: Diagnosis not present

## 2021-11-28 NOTE — Telephone Encounter (Signed)
Please find out following:  Any recent antibiotics?  How many stools per day?  Anybody else around her sick with diarrhea?  If recent antibiotics, she will need C diff GDH antigen.  If no recent antibiotics or people around her sick, then she can take up to three colestid twice daily, do not take within two hours of other meds.   Keep upcoming appointment.

## 2021-11-28 NOTE — Telephone Encounter (Signed)
Pt called stating that she has been having a lot of loose stools for the past couple days. Pt states that she has already taken three colestid today and they have not helped.

## 2021-11-29 IMAGING — CT CT CHEST W/ CM
2 of 4 series · 15 of 36 positions shown, 18 images · IV contrast (omnipaque)
Comparison: 12/02/2020

CLINICAL DATA: Follow-up metastatic small cell lung carcinoma.
Previous chemotherapy. Undergoing radiation therapy.

EXAM:
CT CHEST WITH CONTRAST
TECHNIQUE: Multidetector CT imaging of the chest was performed during
intravenous contrast administration. The patient received a standard
13 hour steroid prep prior to the exam.
CONTRAST:  75mL OMNIPAQUE IOHEXOL 300 MG/ML  SOLN

[Series 2: axial st · axial · 0.67mm/px · z∈[+1428,+1698]mm · 12 of 157 slices shown, 15 images]
[im 11/157  mediastinal]
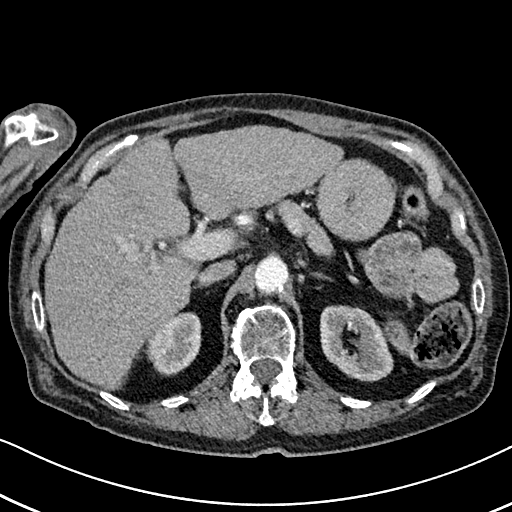
[im 11/157  lung]
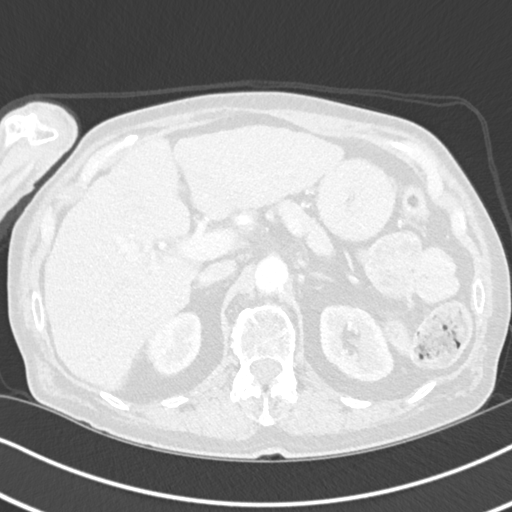
[im 21/157  lung]
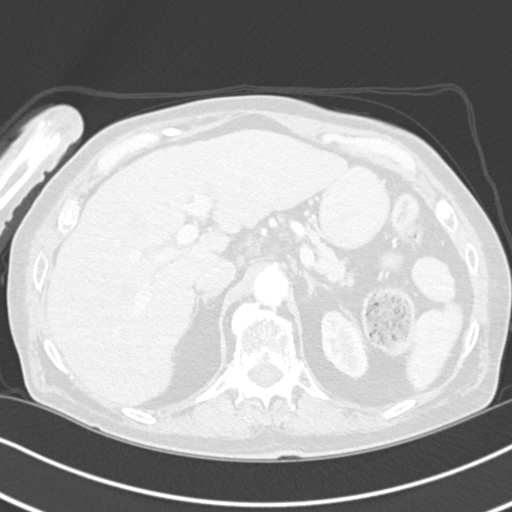
[im 32/157  lung]
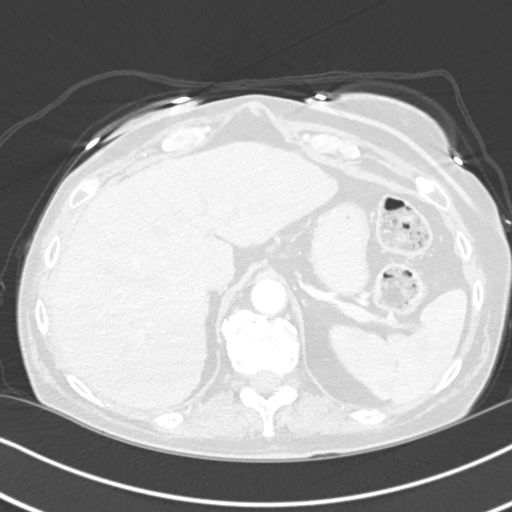
[im 53/157  lung]
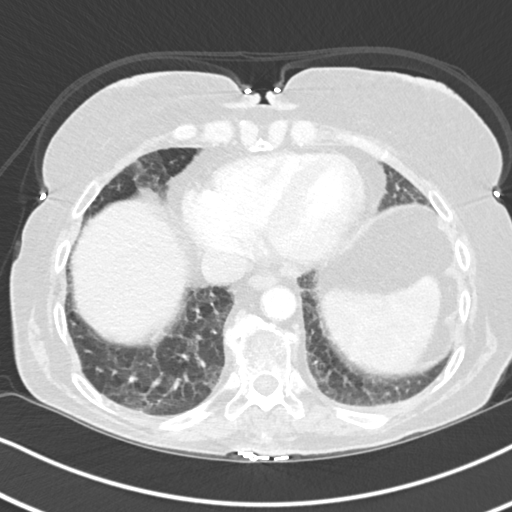
[im 63/157  mediastinal]
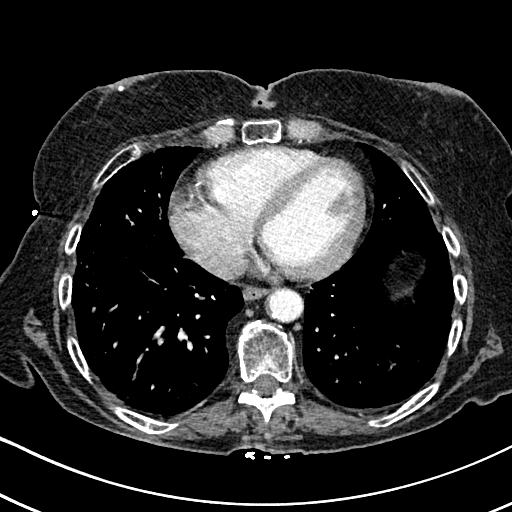
[im 63/157  lung]
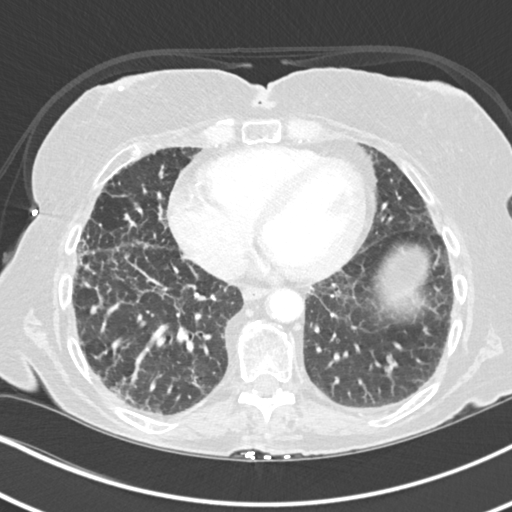
[im 73/157  lung]
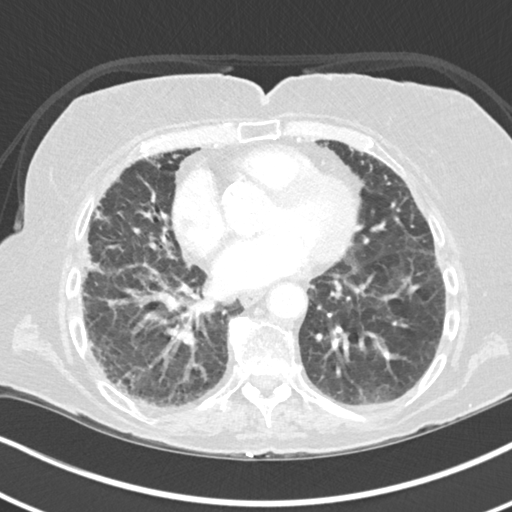
[im 84/157  lung]
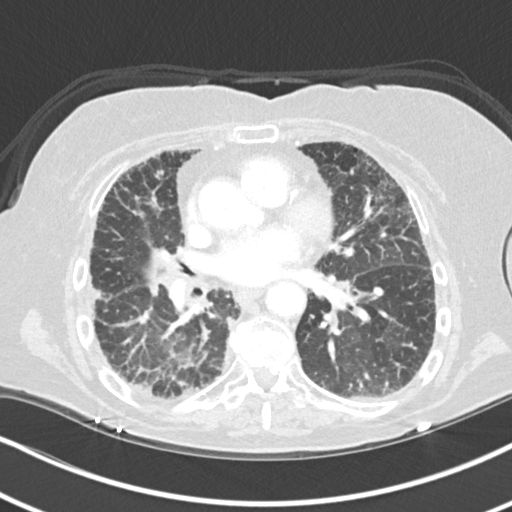
[im 94/157  lung]
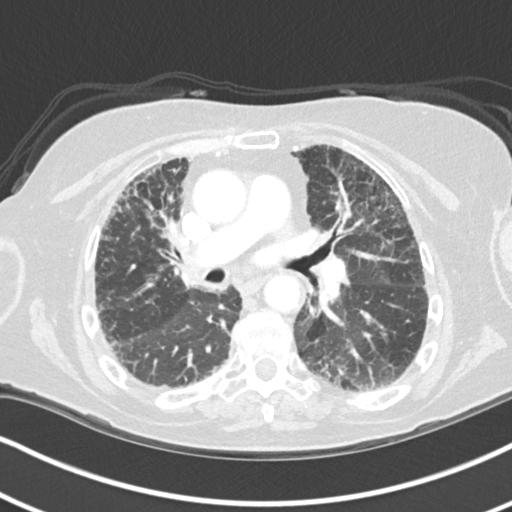
[im 105/157  mediastinal]
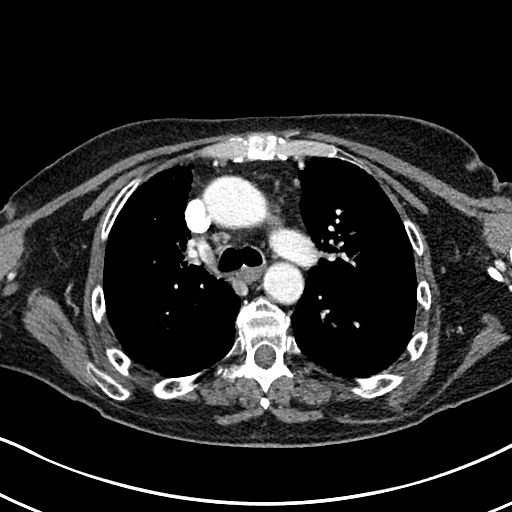
[im 105/157  lung]
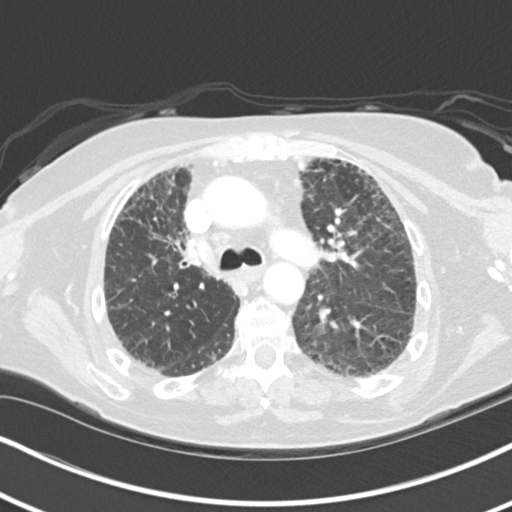
[im 125/157  lung]
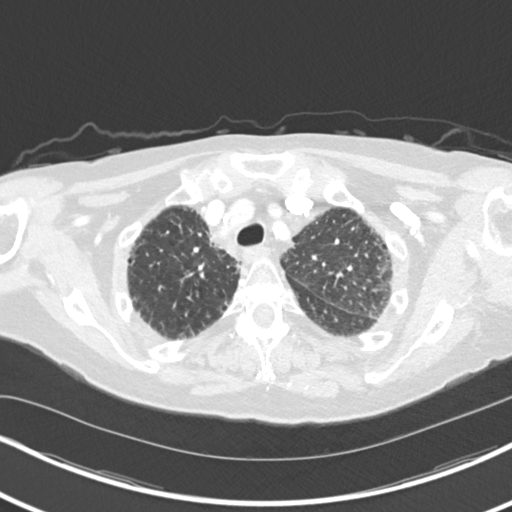
[im 136/157  lung]
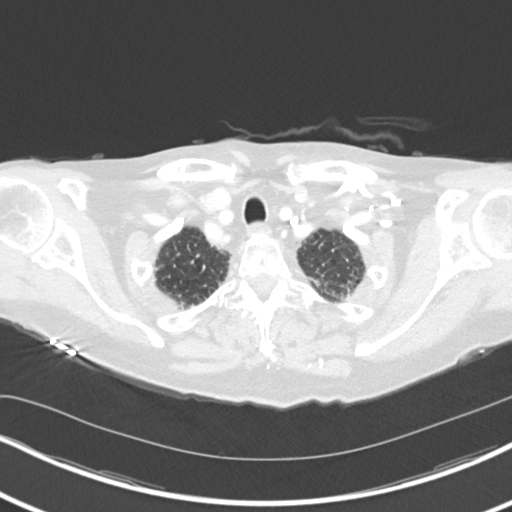
[im 146/157  lung]
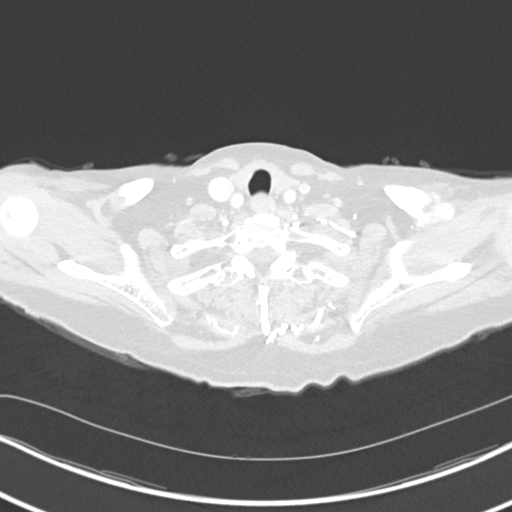

[Series 6: coronal · coronal · 0.62mm/px · 3 of 122 slices shown]
[im 25/122  lung]
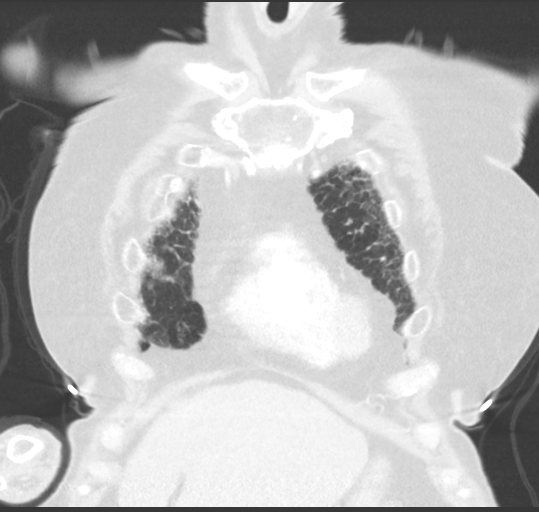
[im 49/122  lung]
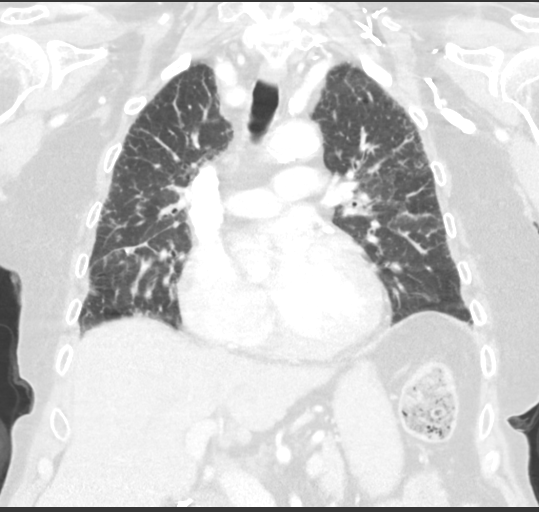
[im 73/122  lung]
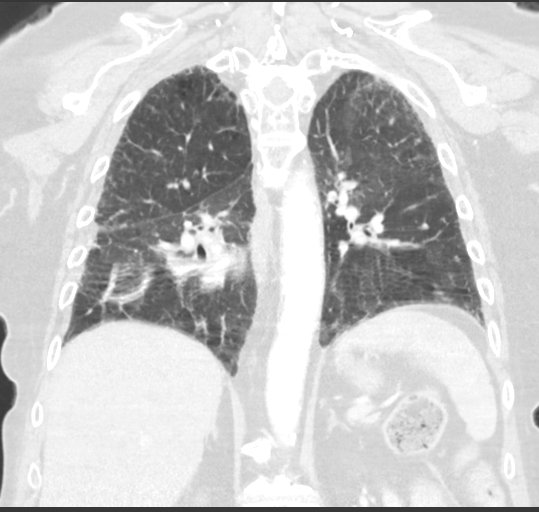

[15 of 36 positions shown; findings below may reference images not displayed]

FINDINGS: Cardiovascular: No acute findings. Aortic and coronary
atherosclerotic calcification noted.

Mediastinum/Nodes: Stable ill-defined soft tissue thickening in
right parahilar region, without discrete lymphadenopathy. No other
sites of lymphadenopathy identified.

Lungs/Pleura: Chronic interstitial lung disease is again
demonstrated. Soft tissue mass in posterior right middle lobe shows
slight decrease in size, currently measuring 2.4 x 2.0 cm on image
79/5, compared to 2.9 x 2.1 cm previously. No new or enlarging
pulmonary nodules or masses identified. No evidence of pleural
effusion.

Upper Abdomen: Normal adrenal glands. Findings of hepatic cirrhosis
noted.

Musculoskeletal:  No suspicious bone lesions.
IMPRESSION: Slight decrease in size of posterior right middle lobe mass.

Stable ill-defined soft tissue thickening in right parahilar region,
without discrete lymphadenopathy.

No new or progressive disease identified within the thorax.

Chronic interstitial lung disease.

Hepatic cirrhosis.

Aortic Atherosclerosis ([id number]).

## 2021-11-29 NOTE — Telephone Encounter (Signed)
Pt states that the last time she was on antibiotics was a couple months ago. The last 3 to 4 days she has been having 3 loose stools per day, but yesterday she had four. No one else around her is sick with diarrhea. I informed pt that she can take up to three colestid twice daily not to be taken within two hours of other meds and to keep her upcoming appointment. Pt verbalized understanding.

## 2021-11-29 NOTE — Telephone Encounter (Signed)
Agree with advice provided.

## 2021-12-06 DIAGNOSIS — N1832 Chronic kidney disease, stage 3b: Secondary | ICD-10-CM | POA: Diagnosis not present

## 2021-12-06 DIAGNOSIS — K219 Gastro-esophageal reflux disease without esophagitis: Secondary | ICD-10-CM | POA: Diagnosis not present

## 2021-12-06 DIAGNOSIS — E039 Hypothyroidism, unspecified: Secondary | ICD-10-CM | POA: Diagnosis not present

## 2021-12-06 DIAGNOSIS — E782 Mixed hyperlipidemia: Secondary | ICD-10-CM | POA: Diagnosis not present

## 2021-12-06 DIAGNOSIS — G4700 Insomnia, unspecified: Secondary | ICD-10-CM | POA: Diagnosis not present

## 2021-12-06 DIAGNOSIS — K7460 Unspecified cirrhosis of liver: Secondary | ICD-10-CM | POA: Diagnosis not present

## 2021-12-06 DIAGNOSIS — I10 Essential (primary) hypertension: Secondary | ICD-10-CM | POA: Diagnosis not present

## 2021-12-06 DIAGNOSIS — R799 Abnormal finding of blood chemistry, unspecified: Secondary | ICD-10-CM | POA: Diagnosis not present

## 2021-12-06 DIAGNOSIS — E1165 Type 2 diabetes mellitus with hyperglycemia: Secondary | ICD-10-CM | POA: Diagnosis not present

## 2021-12-13 ENCOUNTER — Encounter: Payer: Self-pay | Admitting: Pulmonary Disease

## 2021-12-13 ENCOUNTER — Other Ambulatory Visit: Payer: Self-pay

## 2021-12-13 ENCOUNTER — Ambulatory Visit: Payer: Medicare HMO | Admitting: Pulmonary Disease

## 2021-12-13 VITALS — BP 130/88 | HR 82 | Temp 98.4°F | Ht 66.0 in | Wt 125.4 lb

## 2021-12-13 DIAGNOSIS — J432 Centrilobular emphysema: Secondary | ICD-10-CM

## 2021-12-13 DIAGNOSIS — J9611 Chronic respiratory failure with hypoxia: Secondary | ICD-10-CM | POA: Diagnosis not present

## 2021-12-13 DIAGNOSIS — J849 Interstitial pulmonary disease, unspecified: Secondary | ICD-10-CM | POA: Diagnosis not present

## 2021-12-13 NOTE — Progress Notes (Signed)
[facility]  Chief Complaint  Patient presents with   Follow-up    Wheezing has improved.       Constitutional:  BP 130/88 (BP Location: Right Arm, Patient Position: Sitting)    Pulse 82    Temp 98.4 F (36.9 C) (Temporal)    Ht 5' 6"  (1.676 m)    Wt 125 lb 6.4 oz (56.9 kg)    SpO2 96% Comment: ra   BMI 20.24 kg/m   Past Medical History:  CAD, HTN, DM type 2, HLD, Hypothyroidism, Depression, ETOH with cirrhosis, DVT, diastolic CHF, Colon polyp, CKD 2, Neuropathy, Tremor  Past Surgical History:  She  has a past surgical history that includes Dilation and curettage of uterus (1974); Partial hysterectomy (1978); Total abdominal hysterectomy w/ bilateral salpingoophorectomy (2002); Shoulder surgery (Left); Cholecystectomy; Umbilical hernia repair (2008); Colonoscopy (01/2009); Esophagogastroduodenoscopy (05/2010); Colonoscopy (N/A, 04/29/2014); Esophagogastroduodenoscopy (N/A, 04/29/2014); maloney dilation (N/A, 04/29/2014); Colonoscopy (N/A, 05/27/2014); Esophagogastroduodenoscopy (egd) with propofol (N/A, 12/23/2019); Colonoscopy with propofol (N/A, 12/23/2019); Hemostasis clip placement (12/23/2019); polypectomy (12/23/2019); Hot hemostasis (N/A, 12/23/2019); LEFT HEART CATH AND CORONARY ANGIOGRAPHY (N/A, 12/24/2019); Agile capsule (N/A, 03/07/2020); Givens capsule study (N/A, 03/22/2020); Back surgery; Bronchial needle aspiration biopsy (N/A, 05/20/2020); Video bronchoscopy with endobronchial ultrasound (N/A, 05/20/2020); and Knee surgery (Left).  Brief Summary:  [name] is a 71 y.o. female former smoker with COPD and ILD.      Subjective:   She is here with her advocate and therapy dog.  CT chest from 10/20/21 showed decreased size of RML lesion, changes of emphysema and ILD.  She is using 2 liters at night and with exertion.  Her set up is too cumbersome to use due to weight of equipment and difficulty with dexterity due to  having only one hand.  She is not having cough, wheeze, sputum, or chest pain.  Sleeping okay.  Not having leg swelling.    Physical Exam:   Appearance - well kempt   ENMT - no sinus tenderness, no oral exudate, no LAN, Mallampati 2 airway, no stridor  Respiratory - decreased breath sounds bilaterally, basilar crackles and squeaks  CV - s1s2 regular rate and rhythm, no murmurs  Ext - no clubbing, no edema, absent Rt hand  Skin - no rashes  Psych - normal mood and affect   Pulmonary testing:  A1AT 02/17/20 >> 131 PFT 05/12/20 >> FEV1 2.20 (87%), FEV1% 77, DLCO 58%.  Poor technique due to coughing and unable to do lung volume testing  Chest Imaging:  CT chest 12/19/19 >> enlarged b/l hilar and mediastinal LN up to 18 mm, small b/l effusions, diffuse interstitial and interlobular septal prominence, 4.6 x 4 cm area of consolidation in RML, cirrhosis with small amount of ascites CT chest 09/03/20 >> RML mass now 2.3 x 3.5 cm, subpleural reticulation/fibrosis at lung bases  Sleep Tests:    Cardiac Tests:  Echo 12/21/19 >> EF 55 to 60%, grade 2 DD, mild LA dilation, mild/mod MR  Social History:  She  reports that she quit smoking about 4 years ago. Her smoking use included cigarettes. She has a 20.00 pack-year smoking history. She has never used smokeless tobacco. She reports that she does not drink alcohol and does not use drugs.  Family History:  Her family history includes Alcohol abuse in her father; Aneurysm in her father; Bipolar disorder in her mother; Colon cancer in her paternal grandfather; Dementia in her mother; Depression in her mother; Healthy in her child;  Pulmonary fibrosis in her mother.     Assessment/Plan:   COPD mixed type. - she wasn't able to complete PFT previously - don't seen utility in having her do PFT at this time - continue trelegy - prn albuterol  Interstitial lung disease. - has typical pattern for IPF/UIP - no findings to suggest CTD - she  would not be a candidate for antifibrotic therapy given history of liver disease - monitor clinically  Chronic respiratory failure with hypoxia. - uses 2 liters with exertion and sleep - gets supplies through [employer] - her current POC is to cumbersome for her to use effectively - will see if she can get transitioned to an [employer] device - explained that her insurance might not allow for her to switch to different oxygen supply company and if this is the case then she might try to raise funds to purchase equipment on her own  Small cell carcinoma of the lung. - followed by Dr. Curt Bears with [facility], and Dr. [name] with radiation oncology   Hx of CAD. - followed by Dr. 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 - followed by Dr. Garfield Cornea with [facility]  Time Spent Involved in Patient Care on Day of Examination:  39 minutes  Follow up:   Patient Instructions  Will try to arrange for [employer] portable oxygen concentrator  Follow up in 1 year  Medication List:   Allergies as of 12/13/2021       Reactions   Bee Venom Shortness Of Breath, Swelling   Iohexol Shortness Of Breath, Other (See Comments)   CHEST TIGHTNESS and BREATHING PROBLEMS- NEEDS PRE-MEDS 13 hours beforehand-  premeds given 06/08/2020 without incident    Tape Other (See Comments)   Tears the skin!!   Betadine [povidone Iodine] Rash   Ciprofloxacin Rash   Latex Rash, Other (See Comments)   NO latex gloves!!   Penicillins Swelling   Did it involve swelling of the face/tongue/throat, SOB, or low BP? Yes Did it involve sudden or severe rash/hives, skin peeling, or any reaction on the inside of your mouth or nose? No Did you need to seek medical attention at a hospital or doctor's office? Yes When did it last happen?      1996 If all above answers are "NO", may proceed with cephalosporin use.    Povidone-iodine Rash        Medication List        Accurate as of December 13, 2021  4:01 PM. If you have any questions, ask your nurse or doctor.          STOP taking these medications    megestrol 40 MG tablet Commonly known as: MEGACE Stopped by: Chesley Mires, MD       TAKE these medications    albuterol 108 (90 Base) MCG/ACT inhaler Commonly known as: VENTOLIN HFA Inhale 2 puffs into the lungs every 4 (four) hours as needed for wheezing or shortness of breath.   albuterol (2.5 MG/3ML) 0.083% nebulizer solution Commonly known as: PROVENTIL Take 3 mLs (2.5 mg total) by nebulization every 6 (six) hours as needed for up to 30 doses for wheezing or shortness of breath.   aspirin-acetaminophen-caffeine 250-250-65 MG tablet Commonly known as: EXCEDRIN MIGRAINE Take 2 tablets by mouth daily as needed for headache. Up to 4 times a week   BOIL-EASE EX Apply 1 application topically daily as needed (Boil).   carbamide  peroxide 6.5 % OTIC solution Commonly known as: DEBROX Place 5 drops into both ears daily as needed (ears).   colestipol 1 g tablet Commonly known as: COLESTID TAKE 3 TABLETS BY MOUTH ONCE DAILY. DO NOT TAKE WITHIN TWO HOURS OF OTHER MEDS.   diazepam 5 MG tablet Commonly known as: VALIUM Take 2.5 mg by mouth at bedtime. 1/2 tab qhs   diclofenac Sodium 1 % Gel Commonly known as: VOLTAREN Apply topically as needed.   empagliflozin 10 MG Tabs tablet Commonly known as: JARDIANCE Take 25 mg by mouth daily.   fluconazole 100 MG tablet Commonly known as: DIFLUCAN Take 1 tablet (100 mg total) by mouth daily.   furosemide 20 MG tablet Commonly known as: LASIX Take 20 mg by mouth daily.   guaiFENesin-dextromethorphan 100-10 MG/5ML syrup Commonly known as: ROBITUSSIN DM Take 5 mLs by mouth every 4 (four) hours as needed for cough.   Hydrocortisone (Perianal) 1 % Crea Commonly known as: Procto-Pak Apply anorectally twice daily for two weeks.    hydrocortisone cream 1 % Apply 1 application topically daily as needed for itching.   Iron 325 (65 Fe) MG Tabs Take 1 tablet by mouth daily.   levothyroxine 125 MCG tablet Commonly known as: SYNTHROID 1 tablet in the morning on an empty stomach   metFORMIN 500 MG tablet Commonly known as: GLUCOPHAGE   Moderna COVID-19 Bival Booster 50 MCG/0.5ML injection Generic drug: COVID-19 mRNA bivalent vaccine (Moderna)   neomycin-bacitracin-polymyxin Oint Commonly known as: NEOSPORIN Apply 1 application topically daily as needed for irritation or wound care.   oxyCODONE-acetaminophen 5-325 MG tablet Commonly known as: PERCOCET/ROXICET Take 0.5 tablets by mouth 2 (two) times daily as needed for severe pain.   OXYGEN 2L at home   pantoprazole 40 MG tablet Commonly known as: PROTONIX Take 40 mg by mouth daily.   PARoxetine 20 MG tablet Commonly known as: PAXIL Take 20 mg by mouth daily.   predniSONE 50 MG tablet Commonly known as: DELTASONE Take one tablet by mouth 13 hours, on tablet 7 hours and 1 tablet one hour prior to CT scan. Take benaddryl 50 mg onr hour prior to scan.   rosuvastatin 20 MG tablet Commonly known as: CRESTOR Take 20 mg by mouth daily as needed.   traZODone 150 MG tablet Commonly known as: DESYREL Take 75 mg by mouth at bedtime.   Trelegy Ellipta 100-62.5-25 MCG/ACT Aepb Generic drug: Fluticasone-Umeclidin-Vilant 1 puff   Trelegy Ellipta 100-62.5-25 MCG/ACT Aepb Generic drug: Fluticasone-Umeclidin-Vilant Inhale 1 puff into the lungs daily.   Vitamin D3 50 MCG (2000 UT) Tabs Take 8,000 Units by mouth daily.        Signature:  Chesley Mires, MD [place] Pager - [phone] 12/13/2021, 4:01 PM

## 2021-12-13 NOTE — Patient Instructions (Signed)
Will try to arrange for [employer] portable oxygen concentrator ? ?Follow up in 1 year ?

## 2021-12-18 ENCOUNTER — Ambulatory Visit: Payer: Medicare HMO | Admitting: Gastroenterology

## 2021-12-18 NOTE — Progress Notes (Deleted)
? ? ? ? ?Primary Care Physician: [name], MD ? ?Primary Gastroenterologist:  Garfield Cornea, MD ? ? ?No chief complaint on file. ? ? ?HPI: [name] is a 71 y.o. female here for follow up. She has h/o IDA, diarrhea (previous random colon biopsies negative/celiac screen negative), cirrhosis by CT (12/2019) felt to be due to NASH. Last seen in 06/2021.  ? 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 She suffers with some memory issues since then.  ? ?Previously advised to have Hep A/B vaccines when she feels strong enough.  ? ?CT Chest/Abd/pelvis for restaging of small cell lung cancer 10/2021: continued decrease in size of treated lesion within the right middle lobe. Ne new or progressive disease. Cirrhosis. Normal spleen. Chronic interstitial lung disease.  ? ?MRI brain 10/2021: unchanged 76m left cerebellar metastasis, progressive post radiation changes in the cerebral white matter.  ? ?Labs from 10/2021: Cre 1.06, LFTs normal. WBC 4900. H/H 10.1/31.1, platelets 215,000. ? ? ? ? ? ?Previous work-up: ?  ?March 2021 hospitalized with pneumonia.  Cirrhosis with small amount ascites noted on imaging at that time.  Meld sodium of 15.  Hemoglobin in the 7-8 range.  Hematochezia for several months.  [facility] by [facility], Dr. KTherisa DoyneEGD/TCS completed during admission showing mild gastritis (no specimen collected), single polyp removed from colon (path-lipoma), single non-bleeding colonic angioectasia. Treated with argon plasma coagulation (APC). Friable (with contact bleeding) mucosa in the transverse colon. Clip (MR conditional) was placed. Hemoglobin on admission was 8.1 and at time of D/C was 7.9. she received one unit of prbcs. Patient had capsule endoscopy 03/22/20: few gastric erosions and small AVM. Few scattered small bowel erosions. Couple of small bowel AVMs.  ?  ?Patient diagnosed with lung cancer August 2021.  Completed chemo/radiation. ?  ?CTA completed August 2021 to further evaluate chronic  diarrhea.  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  Discussed with Dr. TSherren MochaEarly who she was already established with.  He felt aortic thrombus was insignificant and would not require further follow-up or treatment.  No evidence of mesenteric ischemia.  No need to further treat bilateral renal artery stenosis unless she had progressive renal insufficiency, baseline creatinine at the time was 1.5. ?  ? ?Current Outpatient Medications  ?Medication Sig Dispense Refill  ? albuterol (PROVENTIL) (2.5 MG/3ML) 0.083% nebulizer solution Take 3 mLs (2.5 mg total) by nebulization every 6 (six) hours as needed for up to 30 doses for wheezing or shortness of breath. 90 mL 3  ? albuterol (VENTOLIN HFA) 108 (90 Base) MCG/ACT inhaler Inhale 2 puffs into the lungs every 4 (four) hours as needed for wheezing or shortness of breath. 1 each 3  ? aspirin-acetaminophen-caffeine (EXCEDRIN MIGRAINE) 250-250-65 MG tablet Take 2 tablets by mouth daily as needed for headache. Up to 4 times a week    ? Benzocaine (BOIL-EASE EX) Apply 1 application topically daily as needed (Boil).    ? carbamide peroxide (DEBROX) 6.5 % OTIC solution Place 5 drops into both ears daily as needed (ears).    ? Cholecalciferol (VITAMIN D3) 50 MCG (2000 UT) TABS Take 8,000 Units by mouth daily.    ? colestipol (COLESTID) 1 g tablet TAKE 3 TABLETS BY MOUTH ONCE DAILY. DO NOT TAKE WITHIN TWO HOURS OF OTHER MEDS. 90 tablet 3  ? diazepam (VALIUM) 5 MG tablet Take 2.5 mg by mouth at bedtime. 1/2 tab qhs    ? diclofenac Sodium (VOLTAREN) 1 %  GEL Apply topically as needed.    ? empagliflozin (JARDIANCE) 10 MG TABS tablet Take 25 mg by mouth daily.    ? Ferrous Sulfate (IRON) 325 (65 Fe) MG TABS Take 1 tablet by mouth daily.    ? fluconazole (DIFLUCAN) 100 MG tablet Take 1 tablet (100 mg total) by mouth daily. 7 tablet 0  ? Fluticasone-Umeclidin-Vilant (TRELEGY ELLIPTA) 100-62.5-25 MCG/ACT AEPB 1 puff    ?  Fluticasone-Umeclidin-Vilant (TRELEGY ELLIPTA) 100-62.5-25 MCG/ACT AEPB Inhale 1 puff into the lungs daily. 60 each 0  ? furosemide (LASIX) 20 MG tablet Take 20 mg by mouth daily.    ? guaiFENesin-dextromethorphan (ROBITUSSIN DM) 100-10 MG/5ML syrup Take 5 mLs by mouth every 4 (four) hours as needed for cough.    ? hydrocortisone cream 1 % Apply 1 application topically daily as needed for itching.    ? Hydrocortisone, Perianal, (PROCTO-PAK) 1 % CREA Apply anorectally twice daily for two weeks. 28 g 0  ? levothyroxine (SYNTHROID) 125 MCG tablet 1 tablet in the morning on an empty stomach    ? metFORMIN (GLUCOPHAGE) 500 MG tablet     ? MODERNA COVID-19 BIVAL BOOSTER 50 MCG/0.5ML injection     ? neomycin-bacitracin-polymyxin (NEOSPORIN) OINT Apply 1 application topically daily as needed for irritation or wound care.    ? oxyCODONE-acetaminophen (PERCOCET/ROXICET) 5-325 MG tablet Take 0.5 tablets by mouth 2 (two) times daily as needed for severe pain.    ? OXYGEN 2L at home    ? pantoprazole (PROTONIX) 40 MG tablet Take 40 mg by mouth daily.    ? PARoxetine (PAXIL) 20 MG tablet Take 20 mg by mouth daily.    ? predniSONE (DELTASONE) 50 MG tablet Take one tablet by mouth 13 hours, on tablet 7 hours and 1 tablet one hour prior to CT scan. Take benaddryl 50 mg onr hour prior to scan. 3 tablet 5  ? rosuvastatin (CRESTOR) 20 MG tablet Take 20 mg by mouth daily as needed.    ? traZODone (DESYREL) 150 MG tablet Take 75 mg by mouth at bedtime.    ? ?No current facility-administered medications for this visit.  ? ? ?Allergies as of 12/18/2021 - Review Complete 12/13/2021  ?Allergen Reaction Noted  ? Bee venom Shortness Of Breath and Swelling 08/10/2021  ? Iohexol Shortness Of Breath and Other (See Comments) 02/14/2005  ? Tape Other (See Comments) 03/31/2014  ? Betadine [povidone iodine] Rash 02/28/2021  ? Ciprofloxacin Rash 02/04/2007  ? Latex Rash and Other (See Comments) 03/31/2014  ? Penicillins Swelling 02/04/2007  ?  Povidone-iodine Rash 02/04/2007  ? ? ?ROS: ? ?General: Negative for anorexia, weight loss, fever, chills, fatigue, weakness. ?ENT: Negative for hoarseness, difficulty swallowing , nasal congestion. ?CV: Negative for chest pain, angina, palpitations, dyspnea on exertion, peripheral edema.  ?Respiratory: Negative for dyspnea at rest, dyspnea on exertion, cough, sputum, wheezing.  ?GI: See history of present illness. ?GU:  Negative for dysuria, hematuria, urinary incontinence, urinary frequency, nocturnal urination.  ?Endo: Negative for unusual weight change.  ?  ?Physical Examination: ? ? There were no vitals taken for this visit. ? ?General: Well-nourished, well-developed in no acute distress.  ?Eyes: No icterus. ?Mouth: Oropharyngeal mucosa moist and pink , no lesions erythema or exudate. ?Lungs: Clear to auscultation bilaterally.  ?Heart: Regular rate and rhythm, no murmurs rubs or gallops.  ?Abdomen: Bowel sounds are normal, nontender, nondistended, no hepatosplenomegaly or masses, no abdominal bruits or hernia , no rebound or guarding.   ?Extremities: No lower extremity edema. No  clubbing or deformities. ?Neuro: Alert and oriented x 4   ?Skin: Warm and dry, no jaundice.   ?Psych: Alert and cooperative, normal mood and affect. ? ?Labs:  ?***  ?Imaging Studies: ?No results found. ? ? ?Assessment: ? ? ? ? ?Plan: ? ?

## 2021-12-20 ENCOUNTER — Emergency Department (HOSPITAL_COMMUNITY): Payer: Medicare HMO

## 2021-12-20 ENCOUNTER — Emergency Department (HOSPITAL_COMMUNITY)
Admission: EM | Admit: 2021-12-20 | Discharge: 2021-12-20 | Disposition: A | Discharge status: Home / Self Care | Payer: Medicare HMO | Attending: Emergency Medicine | Admitting: Emergency Medicine

## 2021-12-20 ENCOUNTER — Encounter (HOSPITAL_COMMUNITY): Payer: Self-pay

## 2021-12-20 ENCOUNTER — Other Ambulatory Visit: Payer: Self-pay

## 2021-12-20 DIAGNOSIS — W01198A Fall on same level from slipping, tripping and stumbling with subsequent striking against other object, initial encounter: Secondary | ICD-10-CM | POA: Insufficient documentation

## 2021-12-20 DIAGNOSIS — W19XXXA Unspecified fall, initial encounter: Secondary | ICD-10-CM | POA: Diagnosis not present

## 2021-12-20 DIAGNOSIS — S82041A Displaced comminuted fracture of right patella, initial encounter for closed fracture: Secondary | ICD-10-CM | POA: Diagnosis not present

## 2021-12-20 DIAGNOSIS — R609 Edema, unspecified: Secondary | ICD-10-CM | POA: Diagnosis not present

## 2021-12-20 DIAGNOSIS — J449 Chronic obstructive pulmonary disease, unspecified: Secondary | ICD-10-CM | POA: Diagnosis not present

## 2021-12-20 DIAGNOSIS — Z7951 Long term (current) use of inhaled steroids: Secondary | ICD-10-CM | POA: Insufficient documentation

## 2021-12-20 DIAGNOSIS — S8991XA Unspecified injury of right lower leg, initial encounter: Secondary | ICD-10-CM | POA: Diagnosis present

## 2021-12-20 DIAGNOSIS — Z7984 Long term (current) use of oral hypoglycemic drugs: Secondary | ICD-10-CM | POA: Insufficient documentation

## 2021-12-20 DIAGNOSIS — S0990XA Unspecified injury of head, initial encounter: Secondary | ICD-10-CM | POA: Insufficient documentation

## 2021-12-20 DIAGNOSIS — Z91040 Latex allergy status: Secondary | ICD-10-CM | POA: Insufficient documentation

## 2021-12-20 DIAGNOSIS — S82001A Unspecified fracture of right patella, initial encounter for closed fracture: Secondary | ICD-10-CM | POA: Diagnosis not present

## 2021-12-20 DIAGNOSIS — Z85118 Personal history of other malignant neoplasm of bronchus and lung: Secondary | ICD-10-CM | POA: Insufficient documentation

## 2021-12-20 DIAGNOSIS — E119 Type 2 diabetes mellitus without complications: Secondary | ICD-10-CM | POA: Diagnosis not present

## 2021-12-20 MED ORDER — LIDOCAINE-EPINEPHRINE-TETRACAINE (LET) TOPICAL GEL
3.0000 mL | Freq: Once | TOPICAL | Status: AC
  Administered 2021-12-20: 3 mL via TOPICAL
  Filled 2021-12-20: qty 3

## 2021-12-20 MED ORDER — MORPHINE SULFATE (PF) 4 MG/ML IV SOLN
4.0000 mg | Freq: Once | INTRAVENOUS | Status: AC
  Administered 2021-12-20: 4 mg via INTRAMUSCULAR
  Filled 2021-12-20: qty 1

## 2021-12-20 MED ORDER — ONDANSETRON 4 MG PO TBDP
4.0000 mg | ORAL_TABLET | Freq: Once | ORAL | Status: AC
  Administered 2021-12-20: 4 mg via ORAL
  Filled 2021-12-20: qty 1

## 2021-12-20 NOTE — ED Provider Notes (Cosign Needed)
[facility] EMERGENCY DEPARTMENT Provider Note   CSN: [id number] Arrival date & time: 12/20/21  1619     History  Chief Complaint  Patient presents with   Fall    Rt knee pain    [name] is a 71 y.o. female with a history significant for COPD, with as needed oxygen requirement, history of MI, GERD, traumatic amputation of right hand, type 2 diabetes currently being treated for metastatic small cell lung cancer under the care of Dr. [name] presenting for evaluation of right knee pain and swelling after tripping and falling yesterday.  She is unsure of the exact mechanism, but tripped in her home and felt backwards and hit her head on the floor.  She denies LOC, headaches, vision changes or confusion since this event.  She has had great difficulty weightbearing on the right secondary to her knee pain.  She has used ice packs and elevation and the swelling has improved some but her pain persists.  She takes oxycodone 2.5 mg daily for chronic pain relief.  She has used this medication since the fall which did not completely eliminate her pain.  She has found no other alleviators.  She uses a walker at home at baseline.  The history is provided by the patient.      Home Medications Prior to Admission medications   Medication Sig Start Date End Date Taking? Authorizing Provider  albuterol (PROVENTIL) (2.5 MG/3ML) 0.083% nebulizer solution Take 3 mLs (2.5 mg total) by nebulization every 6 (six) hours as needed for up to 30 doses for wheezing or shortness of breath. 11/14/21   Chesley Mires, MD  albuterol (VENTOLIN HFA) 108 (90 Base) MCG/ACT inhaler Inhale 2 puffs into the lungs every 4 (four) hours as needed for wheezing or shortness of breath. 11/17/20   Chesley Mires, MD  aspirin-acetaminophen-caffeine (EXCEDRIN MIGRAINE) [phone] MG tablet Take 2 tablets by mouth daily as needed for headache. Up to 4 times a week    [provider]  Benzocaine (BOIL-EASE EX) Apply 1  application topically daily as needed (Boil).    [provider]  carbamide peroxide (DEBROX) 6.5 % OTIC solution Place 5 drops into both ears daily as needed (ears).    [provider]  Cholecalciferol (VITAMIN D3) 50 MCG (2000 UT) TABS Take 8,000 Units by mouth daily.    [provider]  colestipol (COLESTID) 1 g tablet TAKE 3 TABLETS BY MOUTH ONCE DAILY. DO NOT TAKE WITHIN TWO HOURS OF OTHER MEDS. 09/13/21   Annitta Needs, NP  diazepam (VALIUM) 5 MG tablet Take 2.5 mg by mouth at bedtime. 1/2 tab qhs    [provider]  diclofenac Sodium (VOLTAREN) 1 % GEL Apply topically as needed.    [provider]  empagliflozin (JARDIANCE) 10 MG TABS tablet Take 25 mg by mouth daily.    [provider]  Ferrous Sulfate (IRON) 325 (65 Fe) MG TABS Take 1 tablet by mouth daily.    [provider]  fluconazole (DIFLUCAN) 100 MG tablet Take 1 tablet (100 mg total) by mouth daily. 11/02/21   Florian Buff, MD  Fluticasone-Umeclidin-Vilant (TRELEGY ELLIPTA) 100-62.5-25 MCG/ACT AEPB 1 puff    [provider]  Fluticasone-Umeclidin-Vilant (TRELEGY ELLIPTA) 100-62.5-25 MCG/ACT AEPB Inhale 1 puff into the lungs daily. 11/15/21   Chesley Mires, MD  furosemide (LASIX) 20 MG tablet Take 20 mg by mouth daily. 10/31/21   [provider]  guaiFENesin-dextromethorphan (ROBITUSSIN DM) 100-10 MG/5ML syrup Take 5 mLs  by mouth every 4 (four) hours as needed for cough.    [provider]  hydrocortisone cream 1 % Apply 1 application topically daily as needed for itching.    [provider]  Hydrocortisone, Perianal, (PROCTO-PAK) 1 % CREA Apply anorectally twice daily for two weeks. 07/12/20   Mahala Menghini, PA-C  levothyroxine (SYNTHROID) 112 MCG tablet     [provider]  levothyroxine (SYNTHROID) 125 MCG tablet 1 tablet in the morning on an empty stomach    [provider]  metFORMIN (GLUCOPHAGE) 500 MG tablet   09/14/21   [provider]  MODERNA COVID-19 BIVAL BOOSTER 50 MCG/0.5ML injection  07/29/21   [provider]  Multiple Vitamin (MULTI VITAMIN) TABS     [provider]  neomycin-bacitracin-polymyxin (NEOSPORIN) OINT Apply 1 application topically daily as needed for irritation or wound care.    [provider]  Omega 3 1000 MG CAPS     [provider]  oxyCODONE-acetaminophen (PERCOCET/ROXICET) 5-325 MG tablet Take 0.5 tablets by mouth 2 (two) times daily as needed for severe pain. 05/20/20   Collene Gobble, MD  OXYGEN 2L at home    [provider]  pantoprazole (PROTONIX) 40 MG tablet Take 40 mg by mouth daily. 09/29/19   [provider]  pantoprazole (PROTONIX) 40 MG tablet     [provider]  PARoxetine (PAXIL) 20 MG tablet Take 20 mg by mouth daily. 01/12/21   [provider]  predniSONE (DELTASONE) 50 MG tablet Take one tablet by mouth 13 hours, on tablet 7 hours and 1 tablet one hour prior to CT scan. Take benaddryl 50 mg onr hour prior to scan. 10/18/21   Curt Bears, MD  pyridOXINE (B-6) 50 MG tablet     [provider]  rosuvastatin (CRESTOR) 20 MG tablet Take 20 mg by mouth daily as needed. 01/12/21   [provider]  Saccharomyces boulardii (PROBIOTIC) 250 MG CAPS     [provider]  traZODone (DESYREL) 150 MG tablet Take 75 mg by mouth at bedtime. 08/19/15   [provider]      Allergies    Bee venom, Iohexol, Tape, Betadine [povidone iodine], Ciprofloxacin, Latex, Penicillins, and Povidone-iodine    Review of Systems   Review of Systems  Constitutional:  Negative for fever.  Musculoskeletal:  Positive for arthralgias and joint swelling. Negative for myalgias.  Skin:  Positive for wound.  Neurological:  Negative for weakness and numbness.  All other systems reviewed and are negative.  Physical Exam Updated Vital Signs BP 133/71    Pulse 81    Temp 98.6 F (37  C) (Oral)    Resp 18    Ht 5' 6"  (1.676 m)    Wt 53.5 kg    SpO2 93%    BMI 19.05 kg/m  Physical Exam Constitutional:      Appearance: She is well-developed.  HENT:     Head: Atraumatic.  Cardiovascular:     Comments: Pulses equal bilaterally Musculoskeletal:        General: Swelling and tenderness present.     Cervical back: Normal range of motion.     Right knee: Swelling and effusion present. Abnormal patellar mobility.  Skin:    General: Skin is warm and dry.     Comments: There is a small superficial abrasion right anterior patella.  Neurological:     Mental Status: She is alert.     Sensory: No sensory deficit.  Motor: No weakness.     Deep Tendon Reflexes: Reflexes normal.    ED Results / Procedures / Treatments   Labs (all labs ordered are listed, but only abnormal results are displayed) Labs Reviewed - No data to display  EKG None  Radiology CT Head Wo Contrast  Result Date: 12/20/2021 CLINICAL DATA:  Head trauma, minor (Age >= 65y) Fall backwards striking back of head well at home yesterday. EXAM: CT HEAD WITHOUT CONTRAST TECHNIQUE: Contiguous axial images were obtained from the base of the skull through the vertex without intravenous contrast. RADIATION DOSE REDUCTION: This exam was performed according to the departmental dose-optimization program which includes automated exposure control, adjustment of the mA and/or kV according to patient size and/or use of iterative reconstruction technique. COMPARISON:  Brain MRI 11/10/2021, head CT 02/28/2021 FINDINGS: Brain: No acute intracranial hemorrhage. Small focus of low-density in the left cerebellum corresponds to prior metastasis on MRI. Normal brain volume for age, no hydrocephalus. Periventricular white matter hypodensities with equivocal progression from prior CT. No subdural or extra-axial collection. No midline shift. Vascular: Hyperdense vessel. Skull: No fracture or focal lesion. Sinuses/Orbits: Paranasal sinuses  and mastoid air cells are clear. The visualized orbits are unremarkable. Other: There is no confluent scalp hematoma. IMPRESSION: 1. No acute intracranial abnormality. No skull fracture. 2. Small focus of low-density in the left cerebellum corresponds to metastasis on MRI. 3. Periventricular white matter hypodensities typical of chronic small vessel ischemia with equivocal progression from prior CT. Electronically Signed   By: Keith Rake M.D.   On: 12/20/2021 17:36   DG Knee Complete 4 Views Right  Result Date: 12/20/2021 CLINICAL DATA:  Pain and swelling after fall. EXAM: RIGHT KNEE - COMPLETE 4+ VIEW COMPARISON:  None. FINDINGS: There is an acute mildly comminuted fracture through the mid patella. Superior pole is displaced 6 mm posteriorly. There is overlying soft tissue swelling and large joint effusion with lipohemarthrosis. There is no evidence for dislocation. There is mild medial compartment joint space narrowing compatible with degenerative change. There are vascular calcifications in the soft tissues. IMPRESSION: 1. Mildly displaced comminuted mid patellar fracture. 2. Large Lipohemarthrosis. Electronically Signed   By: Ronney Asters M.D.   On: 12/20/2021 17:30    Procedures Procedures    Medications Ordered in ED Medications  morphine (PF) 4 MG/ML injection 4 mg (4 mg Intramuscular Given 12/20/21 1719)  ondansetron (ZOFRAN-ODT) disintegrating tablet 4 mg (4 mg Oral Given 12/20/21 1718)  lidocaine-EPINEPHrine-tetracaine (LET) topical gel (3 mLs Topical Given 12/20/21 1839)    ED Course/ Medical Decision Making/ A&P                           Medical Decision Making Patient with a fall occurring yesterday, minor head injury along with right knee trauma including significant pain and effusion.  No other complaints of pain from this fall.  Amount and/or Complexity of Data Reviewed Radiology: ordered.    Details: CT head is negative for acute intracranial trauma.  Her right knee x-ray  confirms a mildly displaced comminuted mid patellar fracture.  There is a large lipohemarthrosis. Discussion of management or test interpretation with external provider(s): Patient was discussed with Dr.Cairns of orthopedics.  She is being discharged home with close follow-up with Dr. Amedeo Kinsman, his office will call her for an appointment in 2 days.  In the interim, ice and elevation, knee immobilizer at all times, no to minimal weightbearing on the right leg.  Risk Prescription drug management. Risk Details: No new medications were prescribed, however patient was advised that she should try increasing her oxycodone to 1 tablet instead of one half if needed for increased pain relief.  Of note, patient's tetanus vaccine is current.  Dressing applied to the abrasion.           Final Clinical Impression(s) / ED Diagnoses Final diagnoses:  Closed displaced comminuted fracture of right patella, initial encounter    Rx / DC Orders ED Discharge Orders     None         Landis Martins 12/20/21 2009

## 2021-12-20 NOTE — ED Triage Notes (Signed)
Pt states fell at home yesterday, landing on right knee, swelling to right knee, right ankle pain as well.  Pt alert, oriented,  Unable to bear weight to right extremity. Palpable peripheral pulses distal to injury ?

## 2021-12-20 NOTE — Discharge Instructions (Signed)
Wear the knee immobilizer at all times to protect your fracture.  Avoid or minimize as much as possible weightbearing on your right leg.  Use your walker to support your weight.  Use your oxycodone as needed for pain, you may need to take a whole tablet if the half a tablet is not covering your pain.  I also suggest elevation and ice to your knee is much as possible to help with pain and swelling as well. ?

## 2021-12-20 NOTE — ED Notes (Signed)
Assisted with bed pan.

## 2021-12-20 NOTE — ED Notes (Signed)
ED Provider at bedside. 

## 2021-12-21 DIAGNOSIS — S82041A Displaced comminuted fracture of right patella, initial encounter for closed fracture: Secondary | ICD-10-CM | POA: Diagnosis not present

## 2021-12-22 ENCOUNTER — Encounter: Payer: Self-pay | Admitting: Surgical

## 2021-12-22 ENCOUNTER — Ambulatory Visit (INDEPENDENT_AMBULATORY_CARE_PROVIDER_SITE_OTHER): Payer: Medicare HMO | Admitting: Surgical

## 2021-12-22 ENCOUNTER — Ambulatory Visit (INDEPENDENT_AMBULATORY_CARE_PROVIDER_SITE_OTHER): Payer: Medicare HMO | Admitting: Orthopedic Surgery

## 2021-12-22 ENCOUNTER — Other Ambulatory Visit: Payer: Self-pay

## 2021-12-22 ENCOUNTER — Encounter: Payer: Self-pay | Admitting: Orthopedic Surgery

## 2021-12-22 DIAGNOSIS — S82041A Displaced comminuted fracture of right patella, initial encounter for closed fracture: Secondary | ICD-10-CM | POA: Diagnosis not present

## 2021-12-22 NOTE — Progress Notes (Signed)
New Patient Visit ? ?Assessment: ?[name] is a 71 y.o. female with the following: ?1. Closed displaced comminuted fracture of right patella, initial encounter ? ?Plan: ?Patient sustained a comminuted, displaced right patella fracture.  We have discussed proceeding with nonoperative management.  However, I am concerned that the patella may not heal well.  In addition, there is a step-off, which is noticeable on the lateral x-ray.  Should we proceed with nonoperative management, she would be in a splint or brace for at least 6-8 weeks.  From there, she would likely develop stiffness, with limited motion in her right knee.  As a result, based on the injury x-rays, I have recommended surgery.  She is interested in surgery after answering all of her questions in regards to the options. ? ?The patient is very medically complex.  She is currently undergoing treatment for lung cancer.  Her cardiologist has previously told her that she should avoid general anesthesia if at all possible.  As a result, this would leave regional anesthesia as her primary option for surgery.  However, should this fail to provide sufficient analgesia, the neck step would be a general anesthesia.  After discussing this with the patient in great detail, she is interested in proceeding with operative fixation, understanding that this is a complex procedure given her medical comorbidities. ? ?I have discussed her case with anesthesia, and understandably, they have concerns.  Based on my discussions with our anesthesia providers, it is best that we refer her to [facility] for definitive management of this injury.  We will have her see Dr. Marlou Sa and his PA in clinic, to discuss treatment options, and a plan for definitive management.  I am happy to see the patient in follow-up at any time for issues related to her right knee, or other orthopedic problems. ? ? ?Follow-up: ?Return if symptoms worsen or fail to improve. ? ?Subjective: ? ?Chief  Complaint  ?Patient presents with  ? Knee Pain  ?  ED follow up RT patella fracture ?DOI 12/19/21  ? ? ?History of Present Illness: ?[name] is a 71 y.o. female who presents for evaluation of a right knee injury.  She is well-known to my practice.  She reports that she fell just a few days ago.  She landed directly onto her right knee.  She had immediate pain.  She presented to the emergency department, where x-rays demonstrated a comminuted, and displaced right patella fracture.  She has been in a knee immobilizer ever since.  She continues to have pain in the right knee.  She has multiple medical issues, which will make consideration for surgery very complex. ? ? ?Review of Systems: ?No fevers or chills ?No numbness or tingling ?No chest pain ?No shortness of breath ?No bowel or bladder dysfunction ?No GI distress ?No headaches ? ?Objective: ?There were no vitals taken for this visit. ? ?Physical Exam: ? ?General: Elderly female., Alert and oriented., No acute distress., and Seated in a wheelchair. ?Gait: Unable to ambulate. ? ?Evaluation of the right knee demonstrates swelling.  She has diffuse tenderness to palpation.  She has a superficial abrasion over the anterior aspect of her knee.  Painful with any range of motion.  She is able to wiggle her toes distally.  2+ DP pulse. ? ?IMAGING: ?I personally reviewed images previously obtained from the ED ? ?X-rays of the right knee from the emergency department were evaluated in clinic today.  She has a comminuted, and displaced fracture of  the right patella.  The primary fracture fragments demonstrate minimal distraction. ? ? ?New Medications:  ?No orders of the defined types were placed in this encounter. ? ? ? ? ?Mordecai Rasmussen, MD ? ?12/22/2021 ?12:00 PM ? ? ?

## 2021-12-25 ENCOUNTER — Other Ambulatory Visit: Payer: Self-pay

## 2021-12-25 ENCOUNTER — Encounter (HOSPITAL_COMMUNITY): Payer: Self-pay | Admitting: Orthopedic Surgery

## 2021-12-25 NOTE — Pre-Procedure Instructions (Signed)
?[facility] [place], [place] ?[place] ?[place] [address] ?Phone: [phone] Fax: [phone] ? ?[place], [place] ?[place] [address] ?Phone: [phone] Fax: [phone] ? ? ?PCP - [name], MD ?Cardiologist - Patwardhan, Reynold Bowen, MD ? ?Chest x-ray - 02/28/21 ?EKG - 03/01/21 ?ECHO - 12/21/30 ?Cardiac Cath - 12/24/19 ? ?ERAS Protcol - Clears until 0830 ? ?COVID TEST- N/A Ambulatory surgery ? ?Anesthesia review: Y ? ?Patient verbally denies any shortness of breath, fever, cough and chest pain during phone call ? ? ?-------------  SDW INSTRUCTIONS given: ? ?Your procedure is scheduled on 12/26/21. ? Report to [facility] Main Entrance "A" at 0900 A.M., and check in at the Admitting office. ? Call this number if you have problems the morning of surgery: ? [phone] ? ? Remember: ? Do not eat after midnight the night before your surgery ? ?You may drink clear liquids until 0830 the morning of your surgery.   ?Clear liquids allowed are: Water, Non-Citrus Juices (without pulp), Carbonated Beverages, Clear Tea, Black Coffee Only, and Gatorade ?  ? Take these medicines the morning of surgery with A SIP OF WATER  ?albuterol (PROVENTIL)-if needed ?albuterol (VENTOLIN HFA)-if needed (Please bring day of surgery) ?Fluticasone-Umeclidin-Vilant (TRELEGY ELLIPTA) ?levothyroxine (SYNTHROID) ?oxyCODONE-acetaminophen (PERCOCET/ROXICET)-if needed ?pantoprazole (PROTONIX) ?PARoxetine (PAXIL)  ?rosuvastatin (CRESTOR) ? ?Do not take empagliflozin (JARDIANCE) today or tomorrow ? ? ? ?As of today, STOP taking any Aspirin (unless otherwise instructed by your surgeon) Aleve, Naproxen, Ibuprofen, Motrin, Advil, Goody's, BC's, all herbal medications, fish oil, and all vitamins. ? ?.** PLEASE check your blood sugar the morning of your surgery when you wake up and every 2 hours until you get to the Short Stay unit. ? ?If your blood sugar is less  than 70 mg/dL, you will need to treat for low blood sugar: ?Do not take insulin. ?Treat a low blood sugar (less than 70 mg/dL) with ? cup of clear juice (cranberry or apple), 4 glucose tablets, OR glucose gel. ?Recheck blood sugar in 15 minutes after treatment (to make sure it is greater than 70 mg/dL). If your blood sugar is not greater than 70 mg/dL on recheck, call [phone] for further instructions.  ?         ?           Do not wear jewelry, make up, or nail polish ?           Do not wear lotions, powders, perfumes/colognes, or deodorant. ?           Do not shave 48 hours prior to surgery.  Men may shave face and neck. ?           Do not bring valuables to the hospital. ?           [facility] is not responsible for any belongings or valuables. ? ?Do NOT Smoke (Tobacco/Vaping) 24 hours prior to your procedure ?If you use a CPAP at night, you may bring all equipment for your overnight stay. ?  ?Contacts, glasses, dentures or bridgework may not be worn into surgery.    ?  ?For patients admitted to the hospital, discharge time will be determined by your treatment team. ?  ?Patients discharged the day of surgery will not be allowed to drive home, and someone needs to stay with them for 24 hours. ? ? ? ?Special instructions:   ?[place] [facility]- Preparing  For Surgery ? ?Before surgery, you can play an important role. Because skin is not sterile, your skin needs to be as free of germs as possible. You can reduce the number of germs on your skin by washing with CHG (chlorahexidine gluconate) Soap before surgery.  CHG is an antiseptic cleaner which kills germs and bonds with the skin to continue killing germs even after washing.   ? ?Oral Hygiene is also important to reduce your risk of infection.  Remember - BRUSH YOUR TEETH THE MORNING OF SURGERY WITH YOUR REGULAR TOOTHPASTE ? ?Please do not use if you have an allergy to CHG or antibacterial soaps. If your skin becomes reddened/irritated stop using the CHG.  ?Do not  shave (including legs and underarms) for at least 48 hours prior to first CHG shower. It is OK to shave your face. ? ?Please follow these instructions carefully. ?  ?Shower the NIGHT BEFORE SURGERY and the MORNING OF SURGERY with DIAL Soap.  ? ?Pat yourself dry with a CLEAN TOWEL. ? ?Wear CLEAN PAJAMAS to bed the night before surgery ? ?Place CLEAN SHEETS on your bed the night of your first shower and DO NOT SLEEP WITH PETS. ? ? ?Day of Surgery: ?Please shower morning of surgery  ?Wear Clean/Comfortable clothing the morning of surgery ?Do not apply any deodorants/lotions.   ?Remember to brush your teeth WITH YOUR REGULAR TOOTHPASTE. ?  ?Questions were answered. Patient verbalized understanding of instructions.  ? ? ?     ?

## 2021-12-25 NOTE — Progress Notes (Signed)
Anesthesia Chart Review: ?Same day workup ? ?Previously followed with cardiology for hx of CAD (RCA CTO),  controlled hypertension, hyperlipidemia. Last seen by Dr. Virgina Jock 05/05/22. Per note, she was stable from cardiac standpoint at that time without any anginal symptoms. She was recommended to followup with cardiology on an as needed basis.  ? ?Follows with pulmonologist Dr. [name] for history of COPD (maintained on Trelegy and as needed albuterol), interstitial lung disease, chronic respiratory failure with hypoxia on 2 L O2 with exertion and sleep.  Last seen 12/13/2021, stable at that time, recommended 1 year follow-up. ? 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  Last seen by Dr. Dineen Kid 11/13/2021 and noted to be stable at that time, no evidence of progression on recent brain MRI. ? ?Follows with neurology for history of essential tremor and gait instability. ? ?Patient will need day of surgery labs and evaluation. ? ?EKG 02/28/2021: NSR.  Rate 73. ? ?CT chest 10/20/2021: ?IMPRESSION: ?1. Continued decrease in size of treated lesion within the right ?middle lobe. No new or progressive disease identified. ?2. Morphologic features of the liver suggestive of cirrhosis. ?3. Chronic interstitial lung disease is identified compatible with ?pulmonary fibrosis. More definitive characterization could be ?obtained with high-resolution CT of the chest if clinically ?indicated. ?4. Coronary artery atherosclerotic calcifications. ?5. Aortic Atherosclerosis (ICD10-I70.0) and Emphysema (ICD10-J43.9). ? ?Coronary angiography 12/24/2019: ?LM: Normal ?LAD: Minimal luminal irregularities ?LCx: Prox LCx 30%, prox 50% OM1 stenosis      ?RCA: Prox RCA CTO with right-to right bridging collaterals, and left-to-right collaterals ?  ?LVEDP 28 mmHg. Hb 7.9 g/dl.  ?  ?Echocardiogram 12/21/2019: ?1. Left ventricular ejection fraction, by estimation, is 55 to  60%. The  ?left ventricle has normal function. The left ventricle has no regional  ?wall motion abnormalities. Left ventricular diastolic parameters are  ?consistent with Grade II diastolic  ?dysfunction (pseudonormalization).  ? 2. Right ventricular systolic function is normal. The right ventricular  ?size is normal.  ? 3. Left atrial size was mildly dilated.  ? 4. The mitral valve is grossly normal. Mild to moderate mitral valve  ?regurgitation.  ? 5. Trileaflet aortic valve with mild calcification. Mild aortic stenosis.  ?Mean PG 9 mmHg, Vmax 2.0 m/sec, AVA 2.1 cm2.  ? 6. Estimated RA pressure 8 mmHg.  ? ? ? ?Karoline Caldwell, PA-C ?[facility]/Anesthesiology ?Phone [phone] ?12/25/2021 4:30 PM ? ?

## 2021-12-25 NOTE — Anesthesia Preprocedure Evaluation (Signed)
Anesthesia Evaluation    Airway        Dental   Pulmonary Patient abstained from smoking., former smoker,           Cardiovascular hypertension,      Neuro/Psych    GI/Hepatic   Endo/Other  diabetes  Renal/GU      Musculoskeletal   Abdominal   Peds  Hematology   Anesthesia Other Findings   Reproductive/Obstetrics                             Anesthesia Physical Anesthesia Plan  ASA:   Anesthesia Plan:    Post-op Pain Management:    Induction:   PONV Risk Score and Plan:   Airway Management Planned:   Additional Equipment:   Intra-op Plan:   Post-operative Plan:   Informed Consent:   Plan Discussed with:   Anesthesia Plan Comments: (PAT note by Karoline Caldwell, PA-C: Previously followed with cardiology for hx of CAD (RCA CTO), controlled hypertension, hyperlipidemia. Last seen by Dr. Virgina Jock 05/05/22. Per note, she was stable from cardiac standpoint at that time without any anginal symptoms. She was recommended to followup with cardiology on an as needed basis.   Follows with pulmonologist Dr. [name] for history of COPD (maintained on Trelegy and as needed albuterol), interstitial lung disease, chronic respiratory failure with hypoxia on 2 L O2 with exertion and sleep.  Last seen 12/13/2021, stable at that time, recommended 1 year follow-up.  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  Last seen by Dr. Dineen Kid 11/13/2021 and noted to be stable at that time, no evidence of progression on recent brain MRI.  Follows with neurology for history of essential tremor and gait instability.  Patient will need day of surgery labs and evaluation.  EKG 02/28/2021: NSR.  Rate 73.  CT chest 10/20/2021: IMPRESSION: 1. Continued decrease in size of treated lesion within the right middle lobe. No new or progressive  disease identified. 2. Morphologic features of the liver suggestive of cirrhosis. 3. Chronic interstitial lung disease is identified compatible with pulmonary fibrosis. More definitive characterization could be obtained with high-resolution CT of the chest if clinically indicated. 4. Coronary artery atherosclerotic calcifications. 5. Aortic Atherosclerosis (ICD10-I70.0) and Emphysema (ICD10-J43.9).  Coronary angiography 12/24/2019: LM: Normal LAD: Minimal luminal irregularities LCx: Prox LCx 30%, prox 50% OM1 stenosis  RCA: Prox RCA CTO with right-to right bridging collaterals, and left-to-right collaterals  LVEDP 28 mmHg. Hb 7.9 g/dl.   Echocardiogram 12/21/2019: 1. Left ventricular ejection fraction, by estimation, is 55 to 60%. The  left ventricle has normal function. The left ventricle has no regional  wall motion abnormalities. Left ventricular diastolic parameters are  consistent with Grade II diastolic  dysfunction (pseudonormalization).  2. Right ventricular systolic function is normal. The right ventricular  size is normal.  3. Left atrial size was mildly dilated.  4. The mitral valve is grossly normal. Mild to moderate mitral valve  regurgitation.  5. Trileaflet aortic valve with mild calcification. Mild aortic stenosis.  Mean PG 9 mmHg, Vmax 2.0 m/sec, AVA 2.1 cm2.  6. Estimated RA pressure 8 mmHg.  )        Anesthesia Quick Evaluation

## 2021-12-25 NOTE — Progress Notes (Cosign Needed)
Office Visit Note   Patient: [name]           Date of Birth: 29-May-1951           MRN: [id number] Visit Date: 12/22/2021 Requested by: [name], MD [place],  [place] [address] PCP: [name], MD  Subjective: Chief Complaint  Patient presents with   Right Knee - New Patient (Initial Visit)    HPI: [name] is a 71 y.o. female who presents to the office complaining of right knee pain.  Patient sustained a direct fall onto her right knee on Tuesday.  She was on her way out the door to see her gastroenterologist as she has been dealing with a "unknown GI bleed".  She was seen in the emergency department where radiographs revealed displaced comminuted right patellar fracture.  She does report remote history of previous "ligament repair" on the right knee.  She has significant medical history including history of CHF, CAD, DVT, dementia, CKD, COPD, interstitial lung disease, small cell carcinoma of the lung, liver cirrhosis.  She has diabetes that is overall well controlled.  She is a current smoker and smokes about 1 carton per month but this is down from 3 cartons per month previously.  She does not take any blood thinners.  She has a cardiologist, Dr. Virgina Jock; patient reports he has told her she is not a candidate for general anesthesia.  She has a medical advocate and friend who helps her out named [name].              ROS: All systems reviewed are negative as they relate to the chief complaint within the history of present illness.  Patient denies fevers or chills.  Assessment & Plan: Visit Diagnoses:  1. Closed displaced comminuted fracture of right patella, initial encounter     Plan: Patient is a 71 year old female who presents for evaluation of displaced, comminuted right patellar fracture.  She was initially seen in the emergency department with follow-up by Dr. Amedeo Kinsman, but he referred her here for further evaluation on recommendation by  anesthesia team at [facility].  She has an operative patellar fracture in her right knee that without surgical intervention will surely go on to cause her severe pain and dysfunction from development of posttraumatic patellofemoral arthritis.  With her medical history, reach out to anesthesia team.  After they reviewed her chart, they did not feel there is anything to keep her from going ahead with right patella ORIF.  Plan for spinal anesthesia with regional block.  Anticipate overnight stay in the hospital for observation with possible discharge home the following day.  With her peripheral vascular disease, smoking history, diabetes, CKD, anticipate incisional Prevena wound VAC to limit risk of wound complication.  I did discuss the risks and benefits of nonoperative versus operative intervention including the risk of severe arthritis and dysfunction in the case of nonoperative intervention and risk of nerve/vessel damage, knee stiffness, postoperative infection, DVT/PE, medical complication from surgery in the case of operative intervention.  After discussion, patient and her friend would like to proceed with surgery.  She will be posted for 12/26/2021; Dr. Marlou Sa will reevaluate her in preop holding.  Follow-Up Instructions: No follow-ups on file.   Orders:  No orders of the defined types were placed in this encounter.  No orders of the defined types were placed in this encounter.     Procedures: No procedures performed   Clinical  Data: No additional findings.  Objective: Vital Signs: There were no vitals taken for this visit.  Physical Exam:  Constitutional: Patient appears well-developed HEENT:  Head: Normocephalic Eyes:EOM are normal Neck: Normal range of motion Cardiovascular: Normal rate Pulmonary/chest: Effort normal Neurologic: Patient is alert Skin: Skin is warm Psychiatric: Patient has normal mood and affect  Ortho Exam: Ortho exam demonstrates right knee in knee  immobilizer.  Knee immobilizer removed to reveal large knee effusion with severe tenderness overlying the right patella.  She is not able to perform straight leg raise.  No calf tenderness.  Negative Homans' sign.  Intact ankle dorsiflexion plantarflexion.  Right foot is warm well perfused.  No significant groin pain with logroll of right hip.  Small abrasion overlying the anterior medial aspect of the knee  Specialty Comments:  No specialty comments available.  Imaging: No results found.   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 ([place]) 03/27/2021   Brain mass 02/28/2021   Acute on chronic diastolic CHF (congestive heart failure) ([place]) 02/28/2021   Brain metastases ([place])    Small cell lung cancer, right middle lobe ([place]) 05/30/2020   Encounter for antineoplastic chemotherapy 05/30/2020   Goals of care, counseling/discussion 05/30/2020   Intolerance to cold 05/23/2020   Mediastinal adenopathy 05/20/2020   Abnormal weight loss 05/17/2020   IDA (iron deficiency anemia) 02/15/2020   Hepatic cirrhosis ([place]) 02/15/2020   CKD (chronic kidney disease), stage IIIa 01/09/2020   Lung mass 01/09/2020   Orthostatic hypotension 12/31/2019   Non-ST elevation (NSTEMI) myocardial infarction ([place])    Anemia    Dyspnea    Hx of heart artery stent    Benign hypertension with CKD (chronic kidney disease) stage III (HCC)    Former smoker    Chest pain 12/20/2019   Weakness 09/04/2014   Dizziness 09/04/2014   Hepatomegaly 03/31/2014   Esophageal dysphagia 03/31/2014   Unspecified constipation 03/31/2014   AKI (acute kidney injury) ([place]) 10/30/2013   Altered mental status 09/28/2013   ETOH abuse 09/28/2013   Altered mental state 09/28/2013   Hyperkalemia 06/05/2013   Bradycardia 06/05/2013   Edema 06/02/2013   Hyponatremia 06/02/2013   Pneumonia due to infectious agent 12/26/2012   Insomnia due to mental disorder 09/02/2012   RECTAL PAIN  09/04/2010   CHEST PAIN 08/22/2010   Insulin dependent type 2 diabetes mellitus ([place]) 06/16/2010   Coronary artery disease/Prior Stents 1997 and 2000/RCA occlusion 12/2019 06/16/2010   MICROALBUMINURIA 06/01/2009   ACTINIC KERATOSIS, HEAD 04/20/2009   OTHER DYSPHAGIA 01/05/2009   COLONIC POLYPS, ADENOMATOUS, HX OF 01/05/2009   SCHATZKI'S RING, HX OF 01/05/2009   BACK PAIN 08/04/2008   DIARRHEA, CHRONIC 08/04/2008   TOBACCO ABUSE 06/03/2007   IBS 06/03/2007   NEPHROLITHIASIS 02/04/2007   ANXIETY STATE NOS 11/26/2006   Hypothyroidism 11/01/2006   Mixed hyperlipidemia 11/01/2006   Depression 11/01/2006   PERIPHERAL NEUROPATHY 11/01/2006   CATARACT NOS 11/01/2006   ALLERGIC RHINITIS 11/01/2006   COPD (chronic obstructive pulmonary disease) ([place]) 11/01/2006   GERD 11/01/2006   DEGENERATION, DISC NOS 11/01/2006   LOW BACK PAIN 11/01/2006   DVT, HX OF 11/01/2006   Past Medical History:  Diagnosis Date   Acute on chronic diastolic CHF (congestive heart failure) ([place]) 02/28/2021   Allergic rhinitis    Amputation of hand, right (HCC) 1984   Anemia    Anxiety    ASCVD (arteriosclerotic cardiovascular disease)    MI in 96 requiring BMS  CX; DES to M1 in 2000;normal coronary angiography in 2004   Cholelithiasis    COPD (chronic obstructive pulmonary disease) (HCC)    DDD (degenerative disc disease), lumbar    Depression    Diabetes mellitus    Type II   Diarrhea    DVT (deep venous thrombosis) ([place])    patient said no   GERD (gastroesophageal reflux disease)    Headache    migraine   History of kidney stones    2006   History of radiation therapy 06/28/2020-08/08/2020   right Lung; Dr. [name]   History of radiation therapy 03/22/2021   brain 03/02/2021-03/22/2021   Dr [name]   Hyperlipidemia    Hypertension    Hypotension    Hypothyroidism    IBS (irritable bowel syndrome)    Low back pain    Myocardial infarction [facility]) 01/1995   Nephrolithiasis 2006   stone  extraction    Panic attacks    Peripheral neuropathy    Peripheral vascular disease (HCC)    legs   Pneumonia 12/22/2019   Sciatic pain    right   Small cell lung cancer ([place])    Tobacco abuse    Tremor    Weakness of extremity    left hand weakness    Family History  Problem Relation Age of Onset   Depression Mother    Bipolar disorder Mother    Dementia Mother    Pulmonary fibrosis Mother    Alcohol abuse Father    Aneurysm Father        deceased age 81, brain   Colon cancer Paternal Grandfather        age greater than 90   Healthy Child     Past Surgical History:  Procedure Laterality Date   AGILE CAPSULE N/A 03/07/2020   Procedure: AGILE CAPSULE;  Surgeon: Daneil Dolin, MD;  [facility];  Service: Endoscopy;  Laterality: N/A;  7:30am   BACK SURGERY     fusion   BRONCHIAL NEEDLE ASPIRATION BIOPSY N/A 05/20/2020   Procedure: BRONCHIAL NEEDLE ASPIRATION BIOPSIES;  Surgeon: Collene Gobble, MD;  Location: [place] [facility];  Service: Pulmonary;  Laterality: N/A;   CHOLECYSTECTOMY     COLONOSCOPY  01/2009   GBM:[id number] rectum/repeat in 5 yrs   COLONOSCOPY N/A 04/29/2014   Dr.Rourk- attempted/incomplete colonoscopy. inadequate prep   COLONOSCOPY N/A 05/27/2014   MCE:[id number] coli. Colonic polyps-removed as described above.Status post segmental biopsy. single tubular adenoma and random colon bx neg   COLONOSCOPY WITH PROPOFOL N/A 12/23/2019   Procedure: COLONOSCOPY WITH PROPOFOL;  Surgeon: Ronnette Juniper, MD;  Location: [facility];  Service: Gastroenterology;  Laterality: N/A;   DILATION AND CURETTAGE OF UTERUS  1974   ESOPHAGOGASTRODUODENOSCOPY  05/2010   Dr. Tessie Fass, erosion. 98F dilation   ESOPHAGOGASTRODUODENOSCOPY N/A 04/29/2014   Dr.Rourk- normal esophagus s/p passage of maloney dilator. small hiatal hernia- bx= chronic inflammation.   ESOPHAGOGASTRODUODENOSCOPY (EGD) WITH PROPOFOL N/A 12/23/2019   Procedure: ESOPHAGOGASTRODUODENOSCOPY (EGD) WITH  PROPOFOL;  Surgeon: Ronnette Juniper, MD;  Location: [facility];  Service: Gastroenterology;  Laterality: N/A;   GIVENS CAPSULE STUDY N/A 03/22/2020   Procedure: GIVENS CAPSULE STUDY;  Surgeon: Daneil Dolin, MD;  [facility];  Service: Endoscopy;  Laterality: N/A;  7:30am   HEMOSTASIS CLIP PLACEMENT  12/23/2019   Procedure: HEMOSTASIS CLIP PLACEMENT;  Surgeon: Ronnette Juniper, MD;  Location: [facility];  Service: Gastroenterology;;   HOT HEMOSTASIS N/A 12/23/2019   Procedure: HOT HEMOSTASIS (ARGON  PLASMA COAGULATION/BICAP);  Surgeon: Ronnette Juniper, MD;  Location: [facility];  Service: Gastroenterology;  Laterality: N/A;   KNEE SURGERY Left    LEFT HEART CATH AND CORONARY ANGIOGRAPHY N/A 12/24/2019   Procedure: LEFT HEART CATH AND CORONARY ANGIOGRAPHY;  Surgeon: Nigel Mormon, MD;  Location: [facility];  Service: Cardiovascular;  Laterality: N/A;   MALONEY DILATION N/A 04/29/2014   Procedure: Venia Minks DILATION;  Surgeon: Daneil Dolin, MD;  [facility];  Service: Endoscopy;  Laterality: N/A;   PARTIAL HYSTERECTOMY  1978   POLYPECTOMY  12/23/2019   Procedure: POLYPECTOMY;  Surgeon: Ronnette Juniper, MD;  Location: [facility];  Service: Gastroenterology;;   SHOULDER SURGERY Left    Left shoulder for RTC;left arm surgery '98/left hand surgery 2001   TOTAL ABDOMINAL HYSTERECTOMY W/ BILATERAL SALPINGOOPHORECTOMY  8786   UMBILICAL HERNIA REPAIR  2008   VIDEO BRONCHOSCOPY WITH ENDOBRONCHIAL ULTRASOUND N/A 05/20/2020   Procedure: VIDEO BRONCHOSCOPY WITH ENDOBRONCHIAL ULTRASOUND;  Surgeon: Collene Gobble, MD;  Location: [facility];  Service: Pulmonary;  Laterality: N/A;   Social History   Occupational History   Occupation: Presenter, 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

## 2021-12-26 ENCOUNTER — Ambulatory Visit (HOSPITAL_BASED_OUTPATIENT_CLINIC_OR_DEPARTMENT_OTHER): Payer: Medicare HMO | Admitting: Physician Assistant

## 2021-12-26 ENCOUNTER — Observation Stay (HOSPITAL_COMMUNITY): Payer: Medicare HMO

## 2021-12-26 ENCOUNTER — Ambulatory Visit (HOSPITAL_COMMUNITY): Payer: Medicare HMO

## 2021-12-26 ENCOUNTER — Observation Stay (HOSPITAL_COMMUNITY)
Admission: RE | Admit: 2021-12-26 | Discharge: 2021-12-29 | Disposition: A | Discharge status: Home Health | Payer: Medicare HMO | Attending: Orthopedic Surgery | Admitting: Orthopedic Surgery

## 2021-12-26 ENCOUNTER — Other Ambulatory Visit: Payer: Self-pay

## 2021-12-26 ENCOUNTER — Ambulatory Visit (HOSPITAL_COMMUNITY): Payer: Medicare HMO | Admitting: Physician Assistant

## 2021-12-26 ENCOUNTER — Encounter (HOSPITAL_COMMUNITY)
Admission: RE | Disposition: A | Discharge status: Home Health | Payer: Self-pay | Source: Home / Self Care | Attending: Orthopedic Surgery

## 2021-12-26 ENCOUNTER — Encounter (HOSPITAL_COMMUNITY): Payer: Self-pay | Admitting: Orthopedic Surgery

## 2021-12-26 DIAGNOSIS — I252 Old myocardial infarction: Secondary | ICD-10-CM | POA: Insufficient documentation

## 2021-12-26 DIAGNOSIS — F419 Anxiety disorder, unspecified: Secondary | ICD-10-CM | POA: Insufficient documentation

## 2021-12-26 DIAGNOSIS — E1151 Type 2 diabetes mellitus with diabetic peripheral angiopathy without gangrene: Secondary | ICD-10-CM | POA: Diagnosis not present

## 2021-12-26 DIAGNOSIS — R262 Difficulty in walking, not elsewhere classified: Secondary | ICD-10-CM | POA: Diagnosis not present

## 2021-12-26 DIAGNOSIS — S82041P Displaced comminuted fracture of right patella, subsequent encounter for closed fracture with malunion: Secondary | ICD-10-CM | POA: Diagnosis not present

## 2021-12-26 DIAGNOSIS — I70209 Unspecified atherosclerosis of native arteries of extremities, unspecified extremity: Secondary | ICD-10-CM | POA: Diagnosis not present

## 2021-12-26 DIAGNOSIS — S82001A Unspecified fracture of right patella, initial encounter for closed fracture: Secondary | ICD-10-CM

## 2021-12-26 DIAGNOSIS — F32A Depression, unspecified: Secondary | ICD-10-CM | POA: Insufficient documentation

## 2021-12-26 DIAGNOSIS — E1142 Type 2 diabetes mellitus with diabetic polyneuropathy: Secondary | ICD-10-CM | POA: Insufficient documentation

## 2021-12-26 DIAGNOSIS — Z923 Personal history of irradiation: Secondary | ICD-10-CM | POA: Insufficient documentation

## 2021-12-26 DIAGNOSIS — I5043 Acute on chronic combined systolic (congestive) and diastolic (congestive) heart failure: Secondary | ICD-10-CM | POA: Diagnosis not present

## 2021-12-26 DIAGNOSIS — S80211A Abrasion, right knee, initial encounter: Secondary | ICD-10-CM | POA: Diagnosis not present

## 2021-12-26 DIAGNOSIS — M2550 Pain in unspecified joint: Secondary | ICD-10-CM | POA: Insufficient documentation

## 2021-12-26 DIAGNOSIS — Z86718 Personal history of other venous thrombosis and embolism: Secondary | ICD-10-CM | POA: Insufficient documentation

## 2021-12-26 DIAGNOSIS — I110 Hypertensive heart disease with heart failure: Secondary | ICD-10-CM | POA: Diagnosis not present

## 2021-12-26 DIAGNOSIS — J439 Emphysema, unspecified: Secondary | ICD-10-CM | POA: Insufficient documentation

## 2021-12-26 DIAGNOSIS — M25561 Pain in right knee: Secondary | ICD-10-CM | POA: Diagnosis not present

## 2021-12-26 DIAGNOSIS — Z9221 Personal history of antineoplastic chemotherapy: Secondary | ICD-10-CM | POA: Insufficient documentation

## 2021-12-26 DIAGNOSIS — Z9981 Dependence on supplemental oxygen: Secondary | ICD-10-CM | POA: Insufficient documentation

## 2021-12-26 DIAGNOSIS — S82041A Displaced comminuted fracture of right patella, initial encounter for closed fracture: Principal | ICD-10-CM | POA: Insufficient documentation

## 2021-12-26 DIAGNOSIS — K219 Gastro-esophageal reflux disease without esophagitis: Secondary | ICD-10-CM | POA: Insufficient documentation

## 2021-12-26 DIAGNOSIS — Z419 Encounter for procedure for purposes other than remedying health state, unspecified: Secondary | ICD-10-CM

## 2021-12-26 DIAGNOSIS — Z7984 Long term (current) use of oral hypoglycemic drugs: Secondary | ICD-10-CM | POA: Insufficient documentation

## 2021-12-26 DIAGNOSIS — Z89111 Acquired absence of right hand: Secondary | ICD-10-CM | POA: Insufficient documentation

## 2021-12-26 DIAGNOSIS — E119 Type 2 diabetes mellitus without complications: Secondary | ICD-10-CM | POA: Insufficient documentation

## 2021-12-26 DIAGNOSIS — Z85118 Personal history of other malignant neoplasm of bronchus and lung: Secondary | ICD-10-CM | POA: Diagnosis not present

## 2021-12-26 DIAGNOSIS — Z7982 Long term (current) use of aspirin: Secondary | ICD-10-CM | POA: Diagnosis not present

## 2021-12-26 DIAGNOSIS — M25461 Effusion, right knee: Secondary | ICD-10-CM | POA: Diagnosis not present

## 2021-12-26 DIAGNOSIS — Z91040 Latex allergy status: Secondary | ICD-10-CM | POA: Diagnosis not present

## 2021-12-26 DIAGNOSIS — I2582 Chronic total occlusion of coronary artery: Secondary | ICD-10-CM | POA: Diagnosis not present

## 2021-12-26 DIAGNOSIS — J449 Chronic obstructive pulmonary disease, unspecified: Secondary | ICD-10-CM | POA: Insufficient documentation

## 2021-12-26 DIAGNOSIS — Z87891 Personal history of nicotine dependence: Secondary | ICD-10-CM | POA: Diagnosis not present

## 2021-12-26 DIAGNOSIS — J9611 Chronic respiratory failure with hypoxia: Secondary | ICD-10-CM | POA: Insufficient documentation

## 2021-12-26 DIAGNOSIS — S82001D Unspecified fracture of right patella, subsequent encounter for closed fracture with routine healing: Secondary | ICD-10-CM | POA: Diagnosis not present

## 2021-12-26 DIAGNOSIS — R519 Headache, unspecified: Secondary | ICD-10-CM | POA: Insufficient documentation

## 2021-12-26 DIAGNOSIS — W19XXXA Unspecified fall, initial encounter: Secondary | ICD-10-CM | POA: Diagnosis not present

## 2021-12-26 DIAGNOSIS — S82041D Displaced comminuted fracture of right patella, subsequent encounter for closed fracture with routine healing: Secondary | ICD-10-CM | POA: Diagnosis not present

## 2021-12-26 DIAGNOSIS — Z9181 History of falling: Secondary | ICD-10-CM | POA: Diagnosis not present

## 2021-12-26 DIAGNOSIS — E039 Hypothyroidism, unspecified: Secondary | ICD-10-CM | POA: Insufficient documentation

## 2021-12-26 DIAGNOSIS — J849 Interstitial pulmonary disease, unspecified: Secondary | ICD-10-CM | POA: Diagnosis not present

## 2021-12-26 DIAGNOSIS — F17210 Nicotine dependence, cigarettes, uncomplicated: Secondary | ICD-10-CM

## 2021-12-26 DIAGNOSIS — E785 Hyperlipidemia, unspecified: Secondary | ICD-10-CM | POA: Insufficient documentation

## 2021-12-26 DIAGNOSIS — Z01818 Encounter for other preprocedural examination: Secondary | ICD-10-CM

## 2021-12-26 DIAGNOSIS — I2510 Atherosclerotic heart disease of native coronary artery without angina pectoris: Secondary | ICD-10-CM

## 2021-12-26 DIAGNOSIS — G250 Essential tremor: Secondary | ICD-10-CM | POA: Insufficient documentation

## 2021-12-26 DIAGNOSIS — M1990 Unspecified osteoarthritis, unspecified site: Secondary | ICD-10-CM | POA: Insufficient documentation

## 2021-12-26 DIAGNOSIS — Z79899 Other long term (current) drug therapy: Secondary | ICD-10-CM | POA: Diagnosis not present

## 2021-12-26 DIAGNOSIS — S82009A Unspecified fracture of unspecified patella, initial encounter for closed fracture: Secondary | ICD-10-CM | POA: Diagnosis present

## 2021-12-26 DIAGNOSIS — R2689 Other abnormalities of gait and mobility: Secondary | ICD-10-CM | POA: Insufficient documentation

## 2021-12-26 DIAGNOSIS — I509 Heart failure, unspecified: Secondary | ICD-10-CM | POA: Diagnosis not present

## 2021-12-26 DIAGNOSIS — C7931 Secondary malignant neoplasm of brain: Secondary | ICD-10-CM | POA: Insufficient documentation

## 2021-12-26 DIAGNOSIS — Z8673 Personal history of transient ischemic attack (TIA), and cerebral infarction without residual deficits: Secondary | ICD-10-CM | POA: Insufficient documentation

## 2021-12-26 HISTORY — DX: Other symptoms and signs involving the musculoskeletal system: R29.898

## 2021-12-26 HISTORY — DX: Irritable bowel syndrome, unspecified: K58.9

## 2021-12-26 HISTORY — DX: Unspecified fracture of unspecified patella, initial encounter for closed fracture: S82.009A

## 2021-12-26 LAB — BASIC METABOLIC PANEL
Anion gap: 9 (ref 5–15)
BUN: 15 mg/dL (ref 8–23)
CO2: 24 mmol/L (ref 22–32)
Calcium: 9.4 mg/dL (ref 8.9–10.3)
Chloride: 102 mmol/L (ref 98–111)
Creatinine, Ser: 0.92 mg/dL (ref 0.44–1.00)
GFR, Estimated: >60 mL/min (ref >60)
Glucose, Bld: 113 mg/dL — ABNORMAL HIGH (ref 70–99)
Potassium: 4 mmol/L (ref 3.5–5.1)
Sodium: 135 mmol/L (ref 135–145)

## 2021-12-26 LAB — CBC
HCT: 29.5 % — ABNORMAL LOW (ref 36.0–46.0)
Hemoglobin: 8.8 g/dL — ABNORMAL LOW (ref 12.0–15.0)
MCH: 28.9 pg (ref 26.0–34.0)
MCHC: 29.8 g/dL — ABNORMAL LOW (ref 30.0–36.0)
MCV: 96.7 fL (ref 80.0–100.0)
Platelets: 226 10*3/uL (ref 150–400)
RBC: 3.05 MIL/uL — ABNORMAL LOW (ref 3.87–5.11)
RDW: 16.7 % — ABNORMAL HIGH (ref 11.5–15.5)
WBC: 5.5 10*3/uL (ref 4.0–10.5)
nRBC: 0 % (ref 0.0–0.2)

## 2021-12-26 LAB — GLUCOSE, CAPILLARY
Glucose-Capillary: 113 mg/dL — ABNORMAL HIGH (ref 70–99)
Glucose-Capillary: 101 mg/dL — ABNORMAL HIGH (ref 70–99)

## 2021-12-26 SURGERY — OPEN REDUCTION INTERNAL FIXATION (ORIF) PATELLA
Anesthesia: Spinal | Site: Knee | Laterality: Right

## 2021-12-26 MED ORDER — PANTOPRAZOLE SODIUM 40 MG PO TBEC
40.0000 mg | DELAYED_RELEASE_TABLET | Freq: Every day | ORAL | Status: DC
Start: 2021-12-27 — End: 2021-12-29
  Administered 2021-12-27 – 2021-12-28 (×2): 40 mg via ORAL
  Filled 2021-12-26 (×2): qty 1

## 2021-12-26 MED ORDER — PROPOFOL 10 MG/ML IV BOLUS
INTRAVENOUS | Status: DC | PRN
  Administered 2021-12-26: 20 mg via INTRAVENOUS

## 2021-12-26 MED ORDER — BUPIVACAINE IN DEXTROSE 0.75-8.25 % IT SOLN
INTRATHECAL | Status: DC | PRN
  Administered 2021-12-26: 2 mL via INTRATHECAL

## 2021-12-26 MED ORDER — PROMETHAZINE HCL 25 MG/ML IJ SOLN: 6.2500 mg | INTRAMUSCULAR | Status: DC | PRN

## 2021-12-26 MED ORDER — METFORMIN HCL 500 MG PO TABS
500.0000 mg | ORAL_TABLET | Freq: Every day | ORAL | Status: DC
Start: 2021-12-27 — End: 2021-12-29
  Administered 2021-12-28: 500 mg via ORAL
  Filled 2021-12-26 (×2): qty 1

## 2021-12-26 MED ORDER — ALBUTEROL SULFATE (2.5 MG/3ML) 0.083% IN NEBU: 2.5000 mg | INHALATION_SOLUTION | Freq: Four times a day (QID) | RESPIRATORY_TRACT | Status: DC | PRN

## 2021-12-26 MED ORDER — VANCOMYCIN HCL IN DEXTROSE 1-5 GM/200ML-% IV SOLN
1000.0000 mg | Freq: Two times a day (BID) | INTRAVENOUS | Status: AC
  Administered 2021-12-26: 1000 mg via INTRAVENOUS
  Filled 2021-12-26 (×2): qty 200

## 2021-12-26 MED ORDER — VANCOMYCIN HCL IN DEXTROSE 1-5 GM/200ML-% IV SOLN
1000.0000 mg | INTRAVENOUS | Status: AC
  Administered 2021-12-26: 1000 mg via INTRAVENOUS
  Filled 2021-12-26: qty 200

## 2021-12-26 MED ORDER — DIAZEPAM 5 MG PO TABS
2.5000 mg | ORAL_TABLET | Freq: Every day | ORAL | Status: DC
  Administered 2021-12-26 – 2021-12-28 (×3): 2.5 mg via ORAL
  Filled 2021-12-26 (×3): qty 1

## 2021-12-26 MED ORDER — FLUTICASONE FUROATE-VILANTEROL 100-25 MCG/ACT IN AEPB
1.0000 | INHALATION_SPRAY | Freq: Every day | RESPIRATORY_TRACT | Status: DC
Start: 2021-12-27 — End: 2021-12-29
  Administered 2021-12-27 – 2021-12-29 (×3): 1 via RESPIRATORY_TRACT
  Filled 2021-12-26: qty 28

## 2021-12-26 MED ORDER — METOCLOPRAMIDE HCL 5 MG PO TABS: 5.0000 mg | ORAL_TABLET | Freq: Three times a day (TID) | ORAL | Status: DC | PRN

## 2021-12-26 MED ORDER — HYDROMORPHONE HCL 1 MG/ML IJ SOLN
0.2500 mg | INTRAMUSCULAR | Status: DC | PRN
  Administered 2021-12-26: 0.5 mg via INTRAVENOUS
  Administered 2021-12-26: 0.25 mg via INTRAVENOUS
  Administered 2021-12-26: 0.5 mg via INTRAVENOUS
  Administered 2021-12-26: 0.25 mg via INTRAVENOUS

## 2021-12-26 MED ORDER — FENTANYL CITRATE (PF) 100 MCG/2ML IJ SOLN
INTRAMUSCULAR | Status: AC
  Filled 2021-12-26: qty 2

## 2021-12-26 MED ORDER — INSULIN ASPART 100 UNIT/ML IJ SOLN: 0.0000 [IU] | INTRAMUSCULAR | Status: DC | PRN

## 2021-12-26 MED ORDER — MIDAZOLAM HCL 2 MG/2ML IJ SOLN
INTRAMUSCULAR | Status: AC
  Filled 2021-12-26: qty 2

## 2021-12-26 MED ORDER — ALBUTEROL SULFATE HFA 108 (90 BASE) MCG/ACT IN AERS: 2.0000 | INHALATION_SPRAY | RESPIRATORY_TRACT | Status: DC | PRN

## 2021-12-26 MED ORDER — OXYCODONE HCL 5 MG PO TABS: 5.0000 mg | ORAL_TABLET | Freq: Once | ORAL | Status: DC | PRN

## 2021-12-26 MED ORDER — ONDANSETRON HCL 4 MG/2ML IJ SOLN: 4.0000 mg | Freq: Four times a day (QID) | INTRAMUSCULAR | Status: DC | PRN

## 2021-12-26 MED ORDER — 0.9 % SODIUM CHLORIDE (POUR BTL) OPTIME
TOPICAL | Status: DC | PRN
  Administered 2021-12-26 (×4): 1000 mL

## 2021-12-26 MED ORDER — LACTATED RINGERS IV SOLN: INTRAVENOUS | Status: DC

## 2021-12-26 MED ORDER — FENTANYL CITRATE (PF) 250 MCG/5ML IJ SOLN
INTRAMUSCULAR | Status: AC
  Filled 2021-12-26: qty 5

## 2021-12-26 MED ORDER — PROPOFOL 1000 MG/100ML IV EMUL
INTRAVENOUS | Status: AC
  Filled 2021-12-26: qty 100

## 2021-12-26 MED ORDER — TRANEXAMIC ACID-NACL 1000-0.7 MG/100ML-% IV SOLN
INTRAVENOUS | Status: AC
  Filled 2021-12-26: qty 100

## 2021-12-26 MED ORDER — METHOCARBAMOL 1000 MG/10ML IJ SOLN
500.0000 mg | Freq: Four times a day (QID) | INTRAVENOUS | Status: DC | PRN
  Filled 2021-12-26: qty 5

## 2021-12-26 MED ORDER — ORAL CARE MOUTH RINSE: 15.0000 mL | Freq: Once | OROMUCOSAL | Status: AC

## 2021-12-26 MED ORDER — FENTANYL CITRATE (PF) 250 MCG/5ML IJ SOLN
INTRAMUSCULAR | Status: DC | PRN
  Administered 2021-12-26: 50 ug via INTRAVENOUS

## 2021-12-26 MED ORDER — HYDROMORPHONE HCL 1 MG/ML IJ SOLN
INTRAMUSCULAR | Status: AC
Start: 2021-12-26 — End: 2021-12-27
  Filled 2021-12-26: qty 1

## 2021-12-26 MED ORDER — UMECLIDINIUM BROMIDE 62.5 MCG/ACT IN AEPB
1.0000 | INHALATION_SPRAY | Freq: Every day | RESPIRATORY_TRACT | Status: DC
  Administered 2021-12-27 – 2021-12-29 (×3): 1 via RESPIRATORY_TRACT
  Filled 2021-12-26: qty 7

## 2021-12-26 MED ORDER — PROPOFOL 500 MG/50ML IV EMUL
INTRAVENOUS | Status: DC | PRN
  Administered 2021-12-26: 50 ug/kg/min via INTRAVENOUS

## 2021-12-26 MED ORDER — MIDAZOLAM HCL 2 MG/2ML IJ SOLN
INTRAMUSCULAR | Status: DC | PRN
  Administered 2021-12-26 (×2): 1 mg via INTRAVENOUS

## 2021-12-26 MED ORDER — LEVOTHYROXINE SODIUM 112 MCG PO TABS
112.0000 ug | ORAL_TABLET | Freq: Every day | ORAL | Status: DC
  Administered 2021-12-27 – 2021-12-29 (×3): 112 ug via ORAL
  Filled 2021-12-26 (×3): qty 1

## 2021-12-26 MED ORDER — VANCOMYCIN HCL 1000 MG IV SOLR
INTRAVENOUS | Status: DC | PRN
  Administered 2021-12-26: 1000 mg

## 2021-12-26 MED ORDER — PROPOFOL 10 MG/ML IV BOLUS
INTRAVENOUS | Status: AC
  Filled 2021-12-26: qty 20

## 2021-12-26 MED ORDER — METHOCARBAMOL 500 MG PO TABS
500.0000 mg | ORAL_TABLET | Freq: Four times a day (QID) | ORAL | Status: DC | PRN
  Administered 2021-12-28 (×2): 500 mg via ORAL
  Filled 2021-12-26 (×2): qty 1

## 2021-12-26 MED ORDER — ONDANSETRON HCL 4 MG PO TABS
4.0000 mg | ORAL_TABLET | Freq: Four times a day (QID) | ORAL | Status: DC | PRN
Start: 2021-12-26 — End: 2021-12-29

## 2021-12-26 MED ORDER — OXYCODONE HCL 5 MG PO TABS
5.0000 mg | ORAL_TABLET | ORAL | Status: DC | PRN
  Administered 2021-12-26 – 2021-12-28 (×9): 10 mg via ORAL
  Filled 2021-12-26 (×9): qty 2

## 2021-12-26 MED ORDER — VANCOMYCIN HCL 1000 MG IV SOLR
INTRAVENOUS | Status: AC
  Filled 2021-12-26: qty 20

## 2021-12-26 MED ORDER — ASPIRIN 81 MG PO CHEW
81.0000 mg | CHEWABLE_TABLET | Freq: Two times a day (BID) | ORAL | Status: DC
  Administered 2021-12-26 – 2021-12-28 (×4): 81 mg via ORAL
  Filled 2021-12-26 (×5): qty 1

## 2021-12-26 MED ORDER — TRANEXAMIC ACID-NACL 1000-0.7 MG/100ML-% IV SOLN
INTRAVENOUS | Status: DC | PRN
  Administered 2021-12-26: 1000 mg via INTRAVENOUS

## 2021-12-26 MED ORDER — EMPAGLIFLOZIN 25 MG PO TABS
25.0000 mg | ORAL_TABLET | Freq: Every day | ORAL | Status: DC
  Administered 2021-12-27 – 2021-12-28 (×2): 25 mg via ORAL
  Filled 2021-12-26 (×3): qty 1

## 2021-12-26 MED ORDER — METOCLOPRAMIDE HCL 5 MG/ML IJ SOLN: 5.0000 mg | Freq: Three times a day (TID) | INTRAMUSCULAR | Status: DC | PRN

## 2021-12-26 MED ORDER — ACETAMINOPHEN 500 MG PO TABS
500.0000 mg | ORAL_TABLET | Freq: Four times a day (QID) | ORAL | Status: AC
  Administered 2021-12-26 – 2021-12-27 (×3): 500 mg via ORAL
  Filled 2021-12-26 (×4): qty 1

## 2021-12-26 MED ORDER — ACETAMINOPHEN 325 MG PO TABS: 325.0000 mg | ORAL_TABLET | Freq: Four times a day (QID) | ORAL | Status: DC | PRN

## 2021-12-26 MED ORDER — PHENYLEPHRINE HCL-NACL 20-0.9 MG/250ML-% IV SOLN
INTRAVENOUS | Status: DC | PRN
  Administered 2021-12-26: 25 ug/min via INTRAVENOUS

## 2021-12-26 MED ORDER — CHLORHEXIDINE GLUCONATE 0.12 % MT SOLN
15.0000 mL | Freq: Once | OROMUCOSAL | Status: AC
  Administered 2021-12-26: 15 mL via OROMUCOSAL
  Filled 2021-12-26: qty 15

## 2021-12-26 MED ORDER — SODIUM CHLORIDE 0.9 % IV SOLN: INTRAVENOUS | Status: DC

## 2021-12-26 MED ORDER — LIDOCAINE 2% (20 MG/ML) 5 ML SYRINGE
INTRAMUSCULAR | Status: AC
  Filled 2021-12-26: qty 5

## 2021-12-26 MED ORDER — OXYCODONE HCL 5 MG/5ML PO SOLN: 5.0000 mg | Freq: Once | ORAL | Status: DC | PRN

## 2021-12-26 MED ORDER — MORPHINE SULFATE (PF) 2 MG/ML IV SOLN
0.5000 mg | INTRAVENOUS | Status: DC | PRN
  Administered 2021-12-26 – 2021-12-27 (×3): 0.5 mg via INTRAVENOUS
  Filled 2021-12-26 (×3): qty 1

## 2021-12-26 MED ORDER — DOCUSATE SODIUM 100 MG PO CAPS
100.0000 mg | ORAL_CAPSULE | Freq: Two times a day (BID) | ORAL | Status: DC
  Administered 2021-12-26 – 2021-12-28 (×5): 100 mg via ORAL
  Filled 2021-12-26 (×5): qty 1

## 2021-12-26 MED ORDER — ONDANSETRON HCL 4 MG/2ML IJ SOLN
INTRAMUSCULAR | Status: DC | PRN
  Administered 2021-12-26: 4 mg via INTRAVENOUS

## 2021-12-26 MED ORDER — HYDROMORPHONE HCL 1 MG/ML IJ SOLN
INTRAMUSCULAR | Status: AC
  Filled 2021-12-26: qty 1

## 2021-12-26 SURGICAL SUPPLY — 79 items
BAG COUNTER SPONGE SURGICOUNT (BAG) ×3 IMPLANT
BAG SPNG CNTER NS LX DISP (BAG) ×1
BANDAGE ESMARK 6X9 LF (GAUZE/BANDAGES/DRESSINGS) ×2 IMPLANT
BIT DRILL 2.6 CANN (BIT) ×1 IMPLANT
BLADE CLIPPER SURG (BLADE) ×2 IMPLANT
BNDG CMPR 9X6 STRL LF SNTH (GAUZE/BANDAGES/DRESSINGS) ×1
BNDG COHESIVE 4X5 TAN STRL (GAUZE/BANDAGES/DRESSINGS) ×3 IMPLANT
BNDG ELASTIC 4X5.8 VLCR STR LF (GAUZE/BANDAGES/DRESSINGS) ×2 IMPLANT
BNDG ELASTIC 6X5.8 VLCR STR LF (GAUZE/BANDAGES/DRESSINGS) ×2 IMPLANT
BNDG ESMARK 6X9 LF (GAUZE/BANDAGES/DRESSINGS) ×2
COOLER ICEMAN CLASSIC (MISCELLANEOUS) ×1 IMPLANT
COVER SURGICAL LIGHT HANDLE (MISCELLANEOUS) ×3 IMPLANT
CUFF TOURN SGL QUICK 34 (TOURNIQUET CUFF)
CUFF TOURN SGL QUICK 42 (TOURNIQUET CUFF) IMPLANT
CUFF TRNQT CYL 34X4.125X (TOURNIQUET CUFF) IMPLANT
DECANTER SPIKE VIAL GLASS SM (MISCELLANEOUS) IMPLANT
DRAPE C-ARM 42X72 X-RAY (DRAPES) ×3 IMPLANT
DRAPE C-ARMOR (DRAPES) ×1 IMPLANT
DRAPE INCISE 23X17 IOBAN STRL (DRAPES) ×1
DRAPE INCISE 23X17 STRL (DRAPES) IMPLANT
DRAPE INCISE IOBAN 23X17 STRL (DRAPES) ×1 IMPLANT
DRESSING AQUACEL AG SP 3.5X10 (GAUZE/BANDAGES/DRESSINGS) IMPLANT
DRSG ADAPTIC 3X8 NADH LF (GAUZE/BANDAGES/DRESSINGS) ×2 IMPLANT
DRSG AQUACEL AG SP 3.5X10 (GAUZE/BANDAGES/DRESSINGS) ×2
DRSG EMULSION OIL 3X3 NADH (GAUZE/BANDAGES/DRESSINGS) ×2 IMPLANT
DRSG PAD ABDOMINAL 8X10 ST (GAUZE/BANDAGES/DRESSINGS) ×3 IMPLANT
DRSG XEROFORM 1X8 (GAUZE/BANDAGES/DRESSINGS) ×1 IMPLANT
ELECT REM PT RETURN 9FT ADLT (ELECTROSURGICAL) ×2
ELECTRODE REM PT RTRN 9FT ADLT (ELECTROSURGICAL) ×2 IMPLANT
FIBERTAPE 2 W/STRL NDL 17 (SUTURE) ×1 IMPLANT
GLOVE SRG 8 PF TXTR STRL LF DI (GLOVE) ×2 IMPLANT
GLOVE SURG ENC MOIS LTX SZ8 (GLOVE) ×4 IMPLANT
GLOVE SURG UNDER POLY LF SZ8 (GLOVE) ×2
GOWN STRL REUS W/ TWL LRG LVL3 (GOWN DISPOSABLE) IMPLANT
GOWN STRL REUS W/ TWL XL LVL3 (GOWN DISPOSABLE) ×4 IMPLANT
GOWN STRL REUS W/TWL LRG LVL3 (GOWN DISPOSABLE)
GOWN STRL REUS W/TWL XL LVL3 (GOWN DISPOSABLE) ×4
GUIDEWIRE 1.35MM  DUAL TROCAR (WIRE) ×6
GUIDEWIRE 1.35MM DUAL TROCAR (WIRE) IMPLANT
IMMOBILIZER KNEE 22 UNIV (SOFTGOODS) ×1 IMPLANT
KIT BASIN OR (CUSTOM PROCEDURE TRAY) ×3 IMPLANT
KIT TURNOVER KIT B (KITS) ×3 IMPLANT
MANIFOLD NEPTUNE II (INSTRUMENTS) ×3 IMPLANT
NEEDLE 22X1 1/2 (OR ONLY) (NEEDLE) IMPLANT
NS IRRIG 1000ML POUR BTL (IV SOLUTION) ×6 IMPLANT
PACK ORTHO EXTREMITY (CUSTOM PROCEDURE TRAY) ×3 IMPLANT
PAD ARMBOARD 7.5X6 YLW CONV (MISCELLANEOUS) ×6 IMPLANT
PAD CAST 4YDX4 CTTN HI CHSV (CAST SUPPLIES) ×4 IMPLANT
PAD COLD SHLDR WRAP-ON (PAD) ×1 IMPLANT
PADDING CAST COTTON 4X4 STRL (CAST SUPPLIES) ×4
PASSER SUT SWANSON 36MM LOOP (INSTRUMENTS) IMPLANT
SCREW 4X36MM CANN LO-PRO (Screw) ×2 IMPLANT
SCREW CANN BLUNT TIP 4X38 LP (Screw) ×1 IMPLANT
SCREW LO-PRO BLUNT TIP 4X40MM (Screw) ×1 IMPLANT
SPONGE T-LAP 4X18 ~~LOC~~+RFID (SPONGE) ×5 IMPLANT
STAPLER VISISTAT 35W (STAPLE) ×3 IMPLANT
STOCKINETTE IMPERVIOUS 9X36 MD (GAUZE/BANDAGES/DRESSINGS) ×3 IMPLANT
STRIP CLOSURE SKIN 1/2X4 (GAUZE/BANDAGES/DRESSINGS) ×1 IMPLANT
SUCTION FRAZIER HANDLE 10FR (MISCELLANEOUS)
SUCTION TUBE FRAZIER 10FR DISP (MISCELLANEOUS) ×2 IMPLANT
SUT FIBERWIRE #2 38 REV NDL BL (SUTURE)
SUT MNCRL AB 3-0 PS2 18 (SUTURE) ×1 IMPLANT
SUT STEEL 5 V 56 M (SUTURE) ×2 IMPLANT
SUT VIC AB 0 CT1 27 (SUTURE) ×4
SUT VIC AB 0 CT1 27XBRD ANBCTR (SUTURE) ×2 IMPLANT
SUT VIC AB 1 CT1 27 (SUTURE) ×6
SUT VIC AB 1 CT1 27XBRD ANBCTR (SUTURE) IMPLANT
SUT VIC AB 1 CT1 36 (SUTURE) ×1 IMPLANT
SUT VIC AB 2-0 CT1 (SUTURE) ×1 IMPLANT
SUT VIC AB 2-0 CT1 27 (SUTURE) ×4
SUT VIC AB 2-0 CT1 TAPERPNT 27 (SUTURE) ×4 IMPLANT
SUTURE FIBERWR#2 38 REV NDL BL (SUTURE) IMPLANT
SYR CONTROL 10ML LL (SYRINGE) IMPLANT
TOWEL GREEN STERILE (TOWEL DISPOSABLE) ×3 IMPLANT
TOWEL GREEN STERILE FF (TOWEL DISPOSABLE) ×3 IMPLANT
TUBE CONNECTING 12X1/4 (SUCTIONS) ×3 IMPLANT
UNDERPAD 30X36 HEAVY ABSORB (UNDERPADS AND DIAPERS) ×3 IMPLANT
WATER STERILE IRR 1000ML POUR (IV SOLUTION) ×2 IMPLANT
YANKAUER SUCT BULB TIP NO VENT (SUCTIONS) IMPLANT

## 2021-12-26 NOTE — Brief Op Note (Signed)
? ?  12/26/2021 ? ?12:57 PM ? ?PATIENT:  [name]  71 y.o. female ? ?PRE-OPERATIVE DIAGNOSIS:  RIGHT KNEE PATELLA FRACTURE ? ?POST-OPERATIVE DIAGNOSIS:  RIGHT KNEE PATELLA FRACTURE ? ?PROCEDURE:  Procedure(s): ?RIGHT OPEN REDUCTION INTERNAL (ORIF) FIXATION PATELLA ? ?SURGEON:  Surgeon(s): ?Meredith Pel, MD ? ?ASSISTANT: magnant pa ? ?ANESTHESIA:   spinal ? ?EBL: 50 ml   ? ?Total I/O ?In: 100 [IV Piggyback:100] ?Out: 300 [Blood:300] ? ?BLOOD ADMINISTERED: none ? ?DRAINS: none  ? ?LOCAL MEDICATIONS USED:  none ? ?SPECIMEN:  No Specimen ? ?COUNTS:  YES ? ?TOURNIQUET:  * Missing tourniquet times found for documented tourniquets in log: [id number] * ? ?DICTATION: .Other Dictation: Dictation Number [id number] ? ?PLAN OF CARE: Admit for overnight observation ? ?PATIENT DISPOSITION:  PACU - hemodynamically stable ? ? ? ? ? ? ? ? ? ? ? ? ?  ?

## 2021-12-26 NOTE — H&P (Signed)
[name] is an 71 y.o. female.   ?Chief Complaint: Right knee pain ?HPI: [name] is a 72 year old female with right knee pain.  She had a fall about a week ago.  She has multiple medical comorbidities.  She does have a remote history of DVT but no active thromboembolic disease.  No history of retinal vein or retinal arterial problems.  Radiographs demonstrate significantly displaced comminuted patella fracture.  Patient does ambulate and had no problems with her knee prior to this fall. ? ?Past Medical History:  ?Diagnosis Date  ? Acute on chronic diastolic CHF (congestive heart failure) ([place]) 02/28/2021  ? Allergic rhinitis   ? Amputation of hand, right ([place]) 1984  ? Anemia   ? Anxiety   ? ASCVD (arteriosclerotic cardiovascular disease)   ? MI in 96 requiring BMS CX; DES to M1 in 2000;normal coronary angiography in 2004  ? Cholelithiasis   ? COPD (chronic obstructive pulmonary disease) ([place])   ? DDD (degenerative disc disease), lumbar   ? Depression   ? Diabetes mellitus   ? Type II  ? Diarrhea   ? DVT (deep venous thrombosis) ([place])   ? patient said no  ? GERD (gastroesophageal reflux disease)   ? Headache   ? migraine  ? History of kidney stones   ? 2006  ? History of radiation therapy 06/28/2020-08/08/2020  ? right Lung; Dr. [name]  ? History of radiation therapy 03/22/2021  ? brain 03/02/2021-03/22/2021   Dr [name]  ? Hyperlipidemia   ? Hypertension   ? Hypotension   ? Hypothyroidism   ? IBS (irritable bowel syndrome)   ? Low back pain   ? Myocardial infarction [facility]) 01/1995  ? Nephrolithiasis 2006  ? stone extraction   ? Panic attacks   ? Peripheral neuropathy   ? Peripheral vascular disease ([place])   ? legs  ? Pneumonia 12/22/2019  ? Sciatic pain   ? right  ? Small cell lung cancer ([place])   ? Tobacco abuse   ? Tremor   ? Weakness of extremity   ? left hand weakness  ? ? ?Past Surgical History:  ?Procedure Laterality Date  ? AGILE CAPSULE N/A 03/07/2020  ? Procedure: AGILE CAPSULE;  Surgeon:  Daneil Dolin, MD;  [facility];  Service: Endoscopy;  Laterality: N/A;  7:30am  ? BACK SURGERY    ? fusion  ? BRONCHIAL NEEDLE ASPIRATION BIOPSY N/A 05/20/2020  ? Procedure: BRONCHIAL NEEDLE ASPIRATION BIOPSIES;  Surgeon: Collene Gobble, MD;  Location: [facility] ENDOSCOPY;  Service: Pulmonary;  Laterality: N/A;  ? CHOLECYSTECTOMY    ? COLONOSCOPY  01/2009  ? OQH:UTMLYY rectum/repeat in 5 yrs  ? COLONOSCOPY N/A 04/29/2014  ? Dr.Rourk- attempted/incomplete colonoscopy. inadequate prep  ? COLONOSCOPY N/A 05/27/2014  ? TKP:TWSFKCLEX coli. Colonic polyps-removed as described above.Status post segmental biopsy. single tubular adenoma and random colon bx neg  ? COLONOSCOPY WITH PROPOFOL N/A 12/23/2019  ? Procedure: COLONOSCOPY WITH PROPOFOL;  Surgeon: Ronnette Juniper, MD;  Location: [place];  Service: Gastroenterology;  Laterality: N/A;  ? [place] OF UTERUS  1974  ? ESOPHAGOGASTRODUODENOSCOPY  05/2010  ? Dr. Tessie Fass, erosion. 48F dilation  ? ESOPHAGOGASTRODUODENOSCOPY N/A 04/29/2014  ? Dr.Rourk- normal esophagus s/p passage of maloney dilator. small hiatal hernia- bx= chronic inflammation.  ? ESOPHAGOGASTRODUODENOSCOPY (EGD) WITH PROPOFOL N/A 12/23/2019  ? Procedure: ESOPHAGOGASTRODUODENOSCOPY (EGD) WITH PROPOFOL;  Surgeon: Ronnette Juniper, MD;  Location: [place];  Service: Gastroenterology;  Laterality: N/A;  ? GIVENS CAPSULE STUDY  N/A 03/22/2020  ? Procedure: GIVENS CAPSULE STUDY;  Surgeon: Daneil Dolin, MD;  [facility];  Service: Endoscopy;  Laterality: N/A;  7:30am  ? HEMOSTASIS CLIP PLACEMENT  12/23/2019  ? Procedure: HEMOSTASIS CLIP PLACEMENT;  Surgeon: Ronnette Juniper, MD;  Location: [place];  Service: Gastroenterology;;  ? HOT HEMOSTASIS N/A 12/23/2019  ? Procedure: HOT HEMOSTASIS (ARGON PLASMA COAGULATION/BICAP);  Surgeon: Ronnette Juniper, MD;  Location: [place];  Service: Gastroenterology;  Laterality: N/A;  ? KNEE SURGERY Left   ? LEFT HEART CATH AND CORONARY  ANGIOGRAPHY N/A 12/24/2019  ? Procedure: LEFT HEART CATH AND CORONARY ANGIOGRAPHY;  Surgeon: Nigel Mormon, MD;  Location: [place] CV LAB;  Service: Cardiovascular;  Laterality: N/A;  ? MALONEY DILATION N/A 04/29/2014  ? Procedure: MALONEY DILATION;  Surgeon: Daneil Dolin, MD;  [facility];  Service: Endoscopy;  Laterality: N/A;  ? PARTIAL HYSTERECTOMY  1978  ? POLYPECTOMY  12/23/2019  ? Procedure: POLYPECTOMY;  Surgeon: Ronnette Juniper, MD;  Location: [place];  Service: Gastroenterology;;  ? SHOULDER SURGERY Left   ? Left shoulder for RTC;left arm surgery '98/left hand surgery 2001  ? TOTAL ABDOMINAL HYSTERECTOMY W/ BILATERAL SALPINGOOPHORECTOMY  2002  ? UMBILICAL HERNIA REPAIR  2008  ? VIDEO BRONCHOSCOPY WITH ENDOBRONCHIAL ULTRASOUND N/A 05/20/2020  ? Procedure: VIDEO BRONCHOSCOPY WITH ENDOBRONCHIAL ULTRASOUND;  Surgeon: Collene Gobble, MD;  Location: [facility] ENDOSCOPY;  Service: Pulmonary;  Laterality: N/A;  ? ? ?Family History  ?Problem Relation Age of Onset  ? Depression Mother   ? Bipolar disorder Mother   ? Dementia Mother   ? Pulmonary fibrosis Mother   ? Alcohol abuse Father   ? Aneurysm Father   ?     deceased age 17, brain  ? Colon cancer Paternal Grandfather   ?     age greater than 73  ? Healthy Child   ? ?Social History:  reports that she quit smoking about 4 years ago. Her smoking use included cigarettes. She has a 20.00 pack-year smoking history. She has never used smokeless tobacco. She reports that she does not drink alcohol and does not use drugs. ? ?Allergies:  ?Allergies  ?Allergen Reactions  ? Bee Venom Shortness Of Breath and Swelling  ? Iohexol Shortness Of Breath and Other (See Comments)  ?  CHEST TIGHTNESS and BREATHING PROBLEMS- NEEDS PRE-MEDS ?13 hours beforehand-  premeds given 06/08/2020 without incident   ? Tape Other (See Comments)  ?  Tears the skin!!  ? Betadine [Povidone Iodine] Rash  ? Ciprofloxacin Rash  ? Latex Rash and Other (See Comments)  ?  NO latex  gloves!! ?  ? Penicillins Swelling  ?  Did it involve swelling of the face/tongue/throat, SOB, or low BP? Yes ?Did it involve sudden or severe rash/hives, skin peeling, or any reaction on the inside of your mouth or nose? No ?Did you need to seek medical attention at a hospital or doctor's office? Yes ?When did it last happen?      1996 ?If all above answers are "NO", may proceed with cephalosporin use.  ? Povidone-Iodine Rash  ? ? ?Medications Prior to Admission  ?Medication Sig Dispense Refill  ? Cholecalciferol (VITAMIN D3) 50 MCG (2000 UT) TABS Take 8,000 Units by mouth daily.    ? colestipol (COLESTID) 1 g tablet TAKE 3 TABLETS BY MOUTH ONCE DAILY. DO NOT TAKE WITHIN TWO HOURS OF OTHER MEDS. 90 tablet 3  ? diazepam (VALIUM) 5 MG tablet Take 2.5 mg by  mouth at bedtime. 1/2 tab qhs    ? empagliflozin (JARDIANCE) 10 MG TABS tablet Take 25 mg by mouth daily.    ? Ferrous Sulfate (IRON) 325 (65 Fe) MG TABS Take 1 tablet by mouth daily.    ? fluconazole (DIFLUCAN) 100 MG tablet Take 1 tablet (100 mg total) by mouth daily. 7 tablet 0  ? Fluticasone-Umeclidin-Vilant (TRELEGY ELLIPTA) 100-62.5-25 MCG/ACT AEPB Inhale 1 puff into the lungs daily. 60 each 0  ? furosemide (LASIX) 20 MG tablet Take 20 mg by mouth daily.    ? guaiFENesin-dextromethorphan (ROBITUSSIN DM) 100-10 MG/5ML syrup Take 5 mLs by mouth every 4 (four) hours as needed for cough.    ? hydrocortisone cream 1 % Apply 1 application topically daily as needed for itching.    ? levothyroxine (SYNTHROID) 112 MCG tablet     ? metFORMIN (GLUCOPHAGE) 500 MG tablet     ? Multiple Vitamin (MULTI VITAMIN) TABS     ? neomycin-bacitracin-polymyxin (NEOSPORIN) OINT Apply 1 application topically daily as needed for irritation or wound care.    ? Omega 3 1000 MG CAPS     ? oxyCODONE-acetaminophen (PERCOCET/ROXICET) 5-325 MG tablet Take 0.5 tablets by mouth 2 (two) times daily as needed for severe pain.    ? pantoprazole (PROTONIX) 40 MG tablet Take 40 mg by mouth daily.     ? PARoxetine (PAXIL) 20 MG tablet Take 20 mg by mouth daily.    ? pyridOXINE (B-6) 50 MG tablet     ? rosuvastatin (CRESTOR) 20 MG tablet Take 20 mg by mouth daily as needed.    ? Saccharomyces boulardii (PROBIOTIC)

## 2021-12-26 NOTE — Plan of Care (Signed)
?  Problem: Education: ?Goal: Knowledge of General Education information will improve ?Description: Including pain rating scale, medication(s)/side effects and non-pharmacologic comfort measures ?Outcome: Progressing ?  ?Problem: Health Behavior/Discharge Planning: ?Goal: Ability to manage health-related needs will improve ?Outcome: Progressing ?  ?Problem: Clinical Measurements: ?Goal: Will remain free from infection ?Outcome: Progressing ?  ?Problem: Activity: ?Goal: Risk for activity intolerance will decrease ?Outcome: Progressing ?  ?Problem: Nutrition: ?Goal: Adequate nutrition will be maintained ?Outcome: Progressing ?  ?Problem: Elimination: ?Goal: Will not experience complications related to bowel motility ?Outcome: Progressing ?  ?Problem: Pain Managment: ?Goal: General experience of comfort will improve ?Outcome: Progressing ?  ?Problem: Safety: ?Goal: Ability to remain free from injury will improve ?Outcome: Progressing ?  ?

## 2021-12-26 NOTE — Op Note (Signed)
NAME: [name], [name] A. ?MEDICAL RECORD NO: [id number] ?ACCOUNT NO: [id number] ?DATE OF BIRTH: [birth year] ?FACILITY: [facility] ?LOCATION: [facility] ?PHYSICIAN: Yetta Barre. Marlou Sa, MD ? ?Operative Report  ? ?DATE OF PROCEDURE: 12/26/2021 ? ?PREOPERATIVE DIAGNOSES:  Right knee patellar fracture. ? ?POSTOPERATIVE DIAGNOSIS:  Right knee patellar fracture. ? ?PROCEDURES:  Right knee patellar fracture open reduction and internal fixation. ? ?SURGEON:  Yetta Barre. Marlou Sa, MD ? ?ASSISTANT:  Annie Main. ? ?INDICATIONS:  This is a 71 year old patient with right knee patellar fracture, displaced who presents for operative management after explanation of risks and benefits. ? ?DESCRIPTION OF PROCEDURE:  The patient was brought to the operating room where spinal anesthetic was induced.  Preoperative antibiotics administered.  Timeout was called.  Right leg was pre-scrubbed with alcohol, then prepped with ChloraPrep solution and ? draped in a sterile manner.  The patient did have a small abrasion on the medial aspect of the patella out of the operative field, which was covered with benzoin and a sterile Tegaderm.  The rest of the knee was covered with clear I[place]an.  Tourniquet was ? not utilized.  Anterior approach to the knee was made.  Skin and subcutaneous tissue was sharply divided.  Soft tissue was carefully elevated off of the patella for later closure.  Fracture was identified by palpation.  Incision made transversely along  ?the fracture line in order to feel the reduction of the fracture.  Thorough irrigation of the joint was performed.  Next, using 2 tenaculums the fracture was reduced.  Three guide pins placed across the fracture in parallel fashion under fluoroscopic  ?guidance in the AP and lateral planes.  4.0 cannulated screws were then placed with smooth tips.  Good reduction was confirmed in the AP and lateral planes under fluoroscopy.  Suture tapes then placed through the outer 2 screws and tied in figure-of-eight fashion,   ?which gave very nice stability to the fracture.  Palpation demonstrated minimal to no step-off.  Thorough irrigation of the joint and the incision was performed.  Vancomycin powder placed into the joint and the capsule was closed using #1 Vicryl suture.  ? Next layer was closed over the sutures after irrigation and after placing vancomycin powder using 0 Vicryl suture.  Then, 2-0 Vicryl suture, and 3-0 Monocryl with Steri-Strips applied.  A square of Xeroform was utilized covered with another Tegaderm and ? then the Aquacel dressing was placed.  The knee was then placed in a bulky Ace wrap, iceman and knee immobilizer.  She will be weightbearing as tolerated, keeping the leg straight.  Discharge home tomorrow as planned.  Luke's assistance was required for ? retraction, opening, closing, mobilization of tissue.  His assistance was a medical necessity ? ? ?PUS ?D: 12/26/2021 1:08:34 pm T: 12/26/2021 2:42:00 pm  ?JOB: [id number]/ [id number]  ?

## 2021-12-26 NOTE — Anesthesia Procedure Notes (Signed)
Spinal ? ?Patient location during procedure: OR ?Start time: 12/26/2021 10:48 AM ?End time: 12/26/2021 10:53 AM ?Reason for block: surgical anesthesia ?Staffing ?Performed: anesthesiologist  ?Anesthesiologist: Lynda Rainwater, MD ?Preanesthetic Checklist ?Completed: patient identified, IV checked, site marked, risks and benefits discussed, surgical consent, monitors and equipment checked, pre-op evaluation and timeout performed ?Spinal Block ?Patient position: sitting ?Prep: DuraPrep ?Patient monitoring: heart rate, cardiac monitor, continuous pulse ox and blood pressure ?Approach: midline ?Location: L3-4 ?Injection technique: single-shot ?Needle ?Needle type: Sprotte  ?Needle gauge: 22 G ?Needle length: 9 cm ?Assessment ?Sensory level: T4 ?Events: CSF return ? ? ? ?

## 2021-12-26 NOTE — Transfer of Care (Signed)
Immediate Anesthesia Transfer of Care Note ? ?Patient: [name] ? ?Procedure(s) Performed: RIGHT OPEN REDUCTION INTERNAL (ORIF) FIXATION PATELLA (Right: Knee) ? ?Patient Location: PACU ? ?Anesthesia Type:Spinal ? ?Level of Consciousness: awake and alert  ? ?Airway & Oxygen Therapy: Patient Spontanous Breathing ? ?Post-op Assessment: Report given to RN and Post -op Vital signs reviewed and stable ? ?Post vital signs: Reviewed and stable ? ?Last Vitals:  ?Vitals Value Taken Time  ?BP    ?Temp    ?Pulse    ?Resp    ?SpO2    ? ? ?Last Pain:  ?Vitals:  ? 12/26/21 0932  ?TempSrc:   ?PainSc: 10-Worst pain ever  ?   ? ?Patients Stated Pain Goal: 0 (12/26/21 0932) ? ?Complications: No notable events documented. ?

## 2021-12-27 DIAGNOSIS — R262 Difficulty in walking, not elsewhere classified: Secondary | ICD-10-CM | POA: Diagnosis not present

## 2021-12-27 DIAGNOSIS — I110 Hypertensive heart disease with heart failure: Secondary | ICD-10-CM | POA: Diagnosis not present

## 2021-12-27 DIAGNOSIS — Z87891 Personal history of nicotine dependence: Secondary | ICD-10-CM | POA: Diagnosis not present

## 2021-12-27 DIAGNOSIS — I5043 Acute on chronic combined systolic (congestive) and diastolic (congestive) heart failure: Secondary | ICD-10-CM | POA: Diagnosis not present

## 2021-12-27 DIAGNOSIS — S82041A Displaced comminuted fracture of right patella, initial encounter for closed fracture: Secondary | ICD-10-CM | POA: Diagnosis not present

## 2021-12-27 DIAGNOSIS — E039 Hypothyroidism, unspecified: Secondary | ICD-10-CM | POA: Diagnosis not present

## 2021-12-27 DIAGNOSIS — Z85118 Personal history of other malignant neoplasm of bronchus and lung: Secondary | ICD-10-CM | POA: Diagnosis not present

## 2021-12-27 DIAGNOSIS — Z91040 Latex allergy status: Secondary | ICD-10-CM | POA: Diagnosis not present

## 2021-12-27 DIAGNOSIS — Z79899 Other long term (current) drug therapy: Secondary | ICD-10-CM | POA: Diagnosis not present

## 2021-12-27 DIAGNOSIS — Z8673 Personal history of transient ischemic attack (TIA), and cerebral infarction without residual deficits: Secondary | ICD-10-CM | POA: Diagnosis not present

## 2021-12-27 LAB — CBC
HCT: 29.2 % — ABNORMAL LOW (ref 36.0–46.0)
Hemoglobin: 8.9 g/dL — ABNORMAL LOW (ref 12.0–15.0)
MCH: 29.3 pg (ref 26.0–34.0)
MCHC: 30.5 g/dL (ref 30.0–36.0)
MCV: 96.1 fL (ref 80.0–100.0)
Platelets: 205 10*3/uL (ref 150–400)
RBC: 3.04 MIL/uL — ABNORMAL LOW (ref 3.87–5.11)
RDW: 16.4 % — ABNORMAL HIGH (ref 11.5–15.5)
WBC: 5.6 10*3/uL (ref 4.0–10.5)
nRBC: 0 % (ref 0.0–0.2)

## 2021-12-27 LAB — VITAMIN D 25 HYDROXY (VIT D DEFICIENCY, FRACTURES): Vit D, 25-Hydroxy: 80.14 ng/mL (ref 30–100)

## 2021-12-27 NOTE — TOC Initial Note (Addendum)
Transition of Care (TOC) - Initial/Assessment Note  ? ? ?Patient Details  ?Name: [name] ?MRN: [id number] ?Date of Birth: 1951/03/12 ? ?Transition of Care [facility]) CM/SW Contact:    ?Sharin Mons, RN ?Phone Number: ?12/27/2021, 4:04 PM ? ?Clinical Narrative:                 ?Admitted s/p fall, suffered R patellar fx. ?    - s/p R knee patellar fracture ORIF, 3/14 ?NCM @ bedside to speak with pt regarding d/c planning. Pt is from home with friend. PTA independent with ADL's. Pt states friend [name] to assist with care if needed @ D/C. ? ?Per PT's evaluation/recommendation: HHPT. Pt agreeable to [facility] services. Pt without preference. Referral made with [facility] and accepted. Face to face will be need for home health PT services from [facility]. ? ?Referral made with [employer] for platform RW. Equipment will be delivered to bedside prior to d/c. ?Pt states has transportation to home once d/c. ? ?TOC team will monitor and assist with needs. ?  ? ?Expected Discharge Plan: [place] (resides with friend, [name]) ?Barriers to Discharge: Continued Medical Work up ? ? ?Patient Goals and CMS Choice ?  ?  ?Choice offered to / list presented to : Patient ? ?Expected Discharge Plan and Services ?Expected Discharge Plan: [place] (resides with friend, [name]) ?  ?Discharge Planning Services: CM Consult ?  ?  ?                ?DME Arranged: [place] rolling (with RUE platform) ?DME Agency: [employer] ?Date DME Agency Contacted: 12/27/21 ?  ?  ?HH Arranged: PT ?[place] Agency: [place] ?Date HH Agency Contacted: 12/27/21 ?Time [place]: 7829 ?Representative spoke with at [place]: Tommi Rumps ? ?Prior Living Arrangements/Services ?  ?Lives with:: Friends ?Patient language and need for interpreter reviewed:: Yes ?Do you feel safe going back to the place where you live?: Yes      ?Need for Family Participation in Patient Care: Yes (Comment) ?Care giver support system in place?:  Yes (comment) ?  ?Criminal Activity/Legal Involvement Pertinent to Current Situation/Hospitalization: No - Comment as needed ? ?Activities of Daily Living ?Home Assistive Devices/Equipment: Gilford Rile (specify type), Wheelchair, Dentures (specify type), CBG Meter (full top/bottom) ?ADL Screening (condition at time of admission) ?Patient's cognitive ability adequate to safely complete daily activities?: Yes ?Is the patient deaf or have difficulty hearing?: No ?Does the patient have difficulty seeing, even when wearing glasses/contacts?: No ?Does the patient have difficulty concentrating, remembering, or making decisions?: No ?Patient able to express need for assistance with ADLs?: Yes ?Does the patient have difficulty dressing or bathing?: No ?Independently performs ADLs?: Yes (appropriate for developmental age) ?Does the patient have difficulty walking or climbing stairs?: Yes ?Weakness of Legs: Right ?Weakness of Arms/Hands: None ? ?Permission Sought/Granted ?  ?Permission granted to share information with : Yes, Verbal Permission Granted ? Share Information with NAME: Alexis Frock [facility]) [phone] ?   ?   ?   ? ?Emotional Assessment ?Appearance:: Appears stated age ?Attitude/Demeanor/Rapport: Engaged ?Affect (typically observed): Accepting ?Orientation: : Oriented to Situation, Oriented to  Time, Oriented to Place, Oriented to Self ?Alcohol / Substance Use: Not Applicable ?Psych Involvement: No (comment) ? ?Admission diagnosis:  Fracture of patella [S82.009A] ?Patient Active Problem List  ? Diagnosis Date Noted  ? Fracture of patella 12/26/2021  ? 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 Brain mass 02/28/2021  ?  Acute on chronic diastolic CHF (congestive heart failure) ([place]) 02/28/2021  ? Brain metastases ([place])   ? Small cell lung cancer, right middle lobe ([place]) 05/30/2020  ? Encounter for antineoplastic chemotherapy 05/30/2020  ? Goals of care, counseling/discussion 05/30/2020  ? Intolerance to cold  05/23/2020  ? Mediastinal adenopathy 05/20/2020  ? Abnormal weight loss 05/17/2020  ? IDA (iron deficiency anemia) 02/15/2020  ? Hepatic cirrhosis ([place]) 02/15/2020  ? CKD (chronic kidney disease), stage IIIa 01/09/2020  ? Lung mass 01/09/2020  ? Orthostatic hypotension 12/31/2019  ? Non-ST elevation (NSTEMI) myocardial infarction [facility])   ? Anemia   ? Dyspnea   ? Hx of heart artery stent   ? Benign hypertension with CKD (chronic kidney disease) stage III (HCC)   ? Former smoker   ? Chest pain 12/20/2019  ? Weakness 09/04/2014  ? Dizziness 09/04/2014  ? Hepatomegaly 03/31/2014  ? Esophageal dysphagia 03/31/2014  ? Unspecified constipation 03/31/2014  ? AKI (acute kidney injury) ([place]) 10/30/2013  ? Altered mental status 09/28/2013  ? ETOH abuse 09/28/2013  ? Altered mental state 09/28/2013  ? Hyperkalemia 06/05/2013  ? Bradycardia 06/05/2013  ? Edema 06/02/2013  ? Hyponatremia 06/02/2013  ? Pneumonia due to infectious agent 12/26/2012  ? Insomnia due to mental disorder 09/02/2012  ? RECTAL PAIN 09/04/2010  ? CHEST PAIN 08/22/2010  ? Insulin dependent type 2 diabetes mellitus ([place]) 06/16/2010  ? Coronary artery disease/Prior Stents 1997 and 2000/RCA occlusion 12/2019 06/16/2010  ? MICROALBUMINURIA 06/01/2009  ? ACTINIC KERATOSIS, HEAD 04/20/2009  ? OTHER DYSPHAGIA 01/05/2009  ? COLONIC POLYPS, ADENOMATOUS, HX OF 01/05/2009  ? SCHATZKI'S RING, HX OF 01/05/2009  ? BACK PAIN 08/04/2008  ? DIARRHEA, CHRONIC 08/04/2008  ? TOBACCO ABUSE 06/03/2007  ? IBS 06/03/2007  ? NEPHROLITHIASIS 02/04/2007  ? ANXIETY STATE NOS 11/26/2006  ? Hypothyroidism 11/01/2006  ? Mixed hyperlipidemia 11/01/2006  ? Depression 11/01/2006  ? PERIPHERAL NEUROPATHY 11/01/2006  ? CATARACT NOS 11/01/2006  ? ALLERGIC RHINITIS 11/01/2006  ? COPD (chronic obstructive pulmonary disease) ([place]) 11/01/2006  ? GERD 11/01/2006  ? DEGENERATION, DISC NOS 11/01/2006  ? LOW BACK PAIN 11/01/2006  ? DVT, HX OF 11/01/2006  ? ?PCP:  [name], MD ?Pharmacy:    ?[place], [place] ?[place] ?[place] [address] ?Phone: [phone] Fax: [phone] ? ?[place] ?[place] [address] ?Phone: [phone] Fax: [phone] ? ? ? ? ?Social Determinants of Health (SDOH) Interventions ?  ? ?Readmission Risk Interventions ?No flowsheet data found. ? ? ?

## 2021-12-27 NOTE — Plan of Care (Signed)
?  Problem: Elimination: ?Goal: Will not experience complications related to bowel motility ?Outcome: Progressing ?  ?Problem: Safety: ?Goal: Ability to remain free from injury will improve ?Outcome: Progressing ?  ?Problem: Skin Integrity: ?Goal: Risk for impaired skin integrity will decrease ?Outcome: Progressing ?  ?

## 2021-12-27 NOTE — Plan of Care (Signed)

## 2021-12-27 NOTE — Progress Notes (Signed)
Patient refused the Ice treatment stated it is too cold and makes her feel worse. Arthor Captain LPN ?

## 2021-12-27 NOTE — Evaluation (Signed)
Physical Therapy Evaluation Patient Details Name: [name] MRN: [id number] DOB: 07/19/1951 Today's Date: 12/27/2021  History of Present Illness  Patient is a 71 y/o female admitted after fall a week ago and positive for R comminuted patellar fracture now s/p ORIF.  PMH positive for CHF, PVD, Peripheral neuropathy, DVT, GERD, NSCLC s/p chemo/XRT, amputation of R hand, DM, HTN, MI, tobacco abuse, tremor.  Clinical Impression  Patient presents with decreased mobility due to pain in R knee, decreased ROM per restrictions from MD, decreased balance with history of falls, decreased safety awareness and decreased strength.  At baseline she furniture walks in her home and has a roommate that assists with some BADL's and IADL's.  She is on intermittent home O2 and owns a rollator.  She was able to transfer to [facility] with min A and perform sit to stand to R PFRW with min A.  Discussed with caregiver home needs and she will need to be able to walk 25' to get in the home, but caregiver, Rosey Bath, is able and willing to assist.  PT will continue to follow acutely.  Recommend follow up HHPT at d/c.      Recommendations for follow up therapy are one component of a multi-disciplinary discharge planning process, led by the attending physician.  Recommendations may be updated based on patient status, additional functional criteria and insurance authorization.  Follow Up Recommendations Home health PT    Assistance Recommended at Discharge Frequent or constant Supervision/Assistance  Patient can return home with the following  A little help with walking and/or transfers;A little help with bathing/dressing/bathroom;Direct supervision/assist for medications management;Direct supervision/assist for financial management;Help with stairs or ramp for entrance;Assistance with cooking/housework    Equipment Recommendations Rolling walker (2 wheels);Other (comment) (R UE platform)  Recommendations for Other Services        Functional Status Assessment Patient has had a recent decline in their functional status and demonstrates the ability to make significant improvements in function in a reasonable and predictable amount of time.     Precautions / Restrictions Precautions Precautions: Fall Required Braces or Orthoses: Knee Immobilizer - Right Knee Immobilizer - Right: On at all times Restrictions Weight Bearing Restrictions: Yes RLE Weight Bearing: Weight bearing as tolerated      Mobility  Bed Mobility Overal bed mobility: Needs Assistance Bed Mobility: Supine to Sit     Supine to sit: Mod assist     General bed mobility comments: assist for R LE and to lift trunk, assist for balance until able to get out to EOB and feet on floor    Transfers Overall transfer level: Needs assistance   Transfers: Bed to chair/wheelchair/BSC, Sit to/from Stand Sit to Stand: Min assist     Squat pivot transfers: Min assist     General transfer comment: to [facility] without device toward L with A for balance and safety cues for hand placement, assist up from [facility] to R PFRW min A for balance and cues for using platform    Ambulation/Gait Ambulation/Gait assistance: Min assist Gait Distance (Feet): 2 Feet Assistive device: Right platform walker Gait Pattern/deviations: Step-to pattern, Decreased stride length       General Gait Details: took about 3 steps forward and chair placed behind due to pain, cues for step to sequence  Stairs            Wheelchair Mobility    Modified Rankin (Stroke Patients Only)       Balance Overall balance assessment: Needs assistance Sitting-balance  support: Feet unsupported Sitting balance-Leahy Scale: Poor Sitting balance - Comments: leaning back needing min a until feet on floor and more anterior weight shift Postural control: Posterior lean Standing balance support: Bilateral upper extremity supported Standing balance-Leahy Scale: Poor Standing balance  comment: UE support and min A for balance initially                             Pertinent Vitals/Pain Pain Assessment Pain Assessment: 0-10 Pain Score: 10-Worst pain ever Pain Location: R knee Pain Descriptors / Indicators: Aching Pain Intervention(s): Monitored during session, RN gave pain meds during session, Patient requesting pain meds-RN notified, Limited activity within patient's tolerance    Home Living Family/patient expects to be discharged to:: Private residence Living Arrangements: Non-relatives/Friends Available Help at Discharge: Personal care attendant Type of Home: House Home Access: Ramped entrance       Home Layout: One level Home Equipment: Patent examiner (4 wheels)      Prior Function Prior Level of Function : Needs assist             Mobility Comments: reports multiple falls at home, cannot count, but usually gets up on her own (per pt); has rollator, but usually furniture walks ADLs Comments: caregiver assists with some BADL's all IADL's     Hand Dominance   Dominant Hand: Left    Extremity/Trunk Assessment   Upper Extremity Assessment Upper Extremity Assessment: RUE deficits/detail RUE Deficits / Details: hand amputation, stabilizes with residual limb well and uses for pressing button for phone    Lower Extremity Assessment Lower Extremity Assessment: RLE deficits/detail RLE Deficits / Details: ankle AROM limited by pain, encouraged continued AROM, assist for hip flexion with pain, but pt quickly learning how to assist with L LE RLE: Unable to fully assess due to immobilization       Communication   Communication: No difficulties  Cognition Arousal/Alertness: Awake/alert Behavior During Therapy: WFL for tasks assessed/performed Overall Cognitive Status: No family/caregiver present to determine baseline cognitive functioning Area of Impairment: Memory, Safety/judgement                     Memory: Decreased  short-term memory   Safety/Judgement: Decreased awareness of safety     General Comments: thought her friend/caregiver who brought her should still be here and did not remember having surgery; somewhat lacking awareness about deficits and concern for multiple falls in her history        General Comments General comments (skin integrity, edema, etc.): Spoke by phone with Rosey Bath, caregiver/roomate about need for 24 hour physical assist she feels she can provide.  Needs pt to be able to walk up ramp to get into the house 25'.  Discussed DME needs as well.    Exercises Total Joint Exercises Ankle Circles/Pumps: AROM, Supine, 5 reps   Assessment/Plan    PT Assessment Patient needs continued PT services  PT Problem List Decreased strength;Decreased mobility;Decreased activity tolerance;Decreased balance;Decreased knowledge of use of DME;Pain;Decreased range of motion;Decreased safety awareness       PT Treatment Interventions DME instruction;Therapeutic activities;Patient/family education;Therapeutic exercise;Gait training;Balance training;Functional mobility training    PT Goals (Current goals can be found in the Care Plan section)  Acute Rehab PT Goals Patient Stated Goal: to go home by tomorrow at latest PT Goal Formulation: With patient Time For Goal Achievement: 01/10/22 Potential to Achieve Goals: Good    Frequency Min 5X/week     Co-evaluation  AM-PAC PT "6 Clicks" Mobility  Outcome Measure Help needed turning from your back to your side while in a flat bed without using bedrails?: A Little Help needed moving from lying on your back to sitting on the side of a flat bed without using bedrails?: A Lot Help needed moving to and from a bed to a chair (including a wheelchair)?: A Little Help needed standing up from a chair using your arms (e.g., wheelchair or bedside chair)?: A Little Help needed to walk in hospital room?: Total Help needed climbing 3-5  steps with a railing? : Total 6 Click Score: 13    End of Session Equipment Utilized During Treatment: Gait belt;Right knee immobilizer Activity Tolerance: Patient limited by pain Patient left: in chair;with call bell/phone within reach;with chair alarm set   PT Visit Diagnosis: Other abnormalities of gait and mobility (R26.89);Pain;Difficulty in walking, not elsewhere classified (R26.2);History of falling (Z91.81) Pain - Right/Left: Right Pain - part of body: Knee    Time: 4782-9562 PT Time Calculation (min) (ACUTE ONLY): 43 min   Charges:   PT Evaluation $PT Eval Moderate Complexity: 1 Mod PT Treatments $Therapeutic Activity: 8-22 mins        Sheran Lawless, PT Acute Rehabilitation Services Pager:[phone] Office:[phone] 12/27/2021   Elray Mcgregor 12/27/2021, 11:52 AM

## 2021-12-27 NOTE — Progress Notes (Signed)
?  Subjective: ?Patient is a 71 year old female who presents POD 1 s/p right patella fracture ORIF.  Doing well overall.  No complaint of chest pain or abnormal shortness of breath.  She does have pulmonary history but no new shortness of breath.  No abdominal pain.  Passing gas well.  Did have a bowel movement this morning.  Denies any calf pain.  She has not really ambulated much with therapy but was able to transfer from her bed to her chair.  Oxygen saturation dropped a couple times earlier today but overall is maintaining well.  ? ?Objective: ?Vital signs in last 24 hours: ?Temp:  [97.7 ?F (36.5 ?C)-98.9 ?F (37.2 ?C)] 98.6 ?F (37 ?C) (03/15 1526) ?Pulse Rate:  [73-92] 92 (03/15 1526) ?Resp:  [17-19] 17 (03/15 1526) ?BP: (126-146)/(60-64) 146/62 (03/15 1526) ?SpO2:  [84 %-98 %] 84 % (03/15 1526) ? ?Intake/Output from previous day: ?03/14 0701 - 03/15 0700 ?In: 300.5 [I.V.:0.5; IV Piggyback:300] ?Out: 150 [Blood:150] ?Intake/Output this shift: ?Total I/O ?In: 240 [P.O.:240] ?Out: 3 [Urine:3] ? ?Exam: ? ?Ortho exam demonstrates postoperative dressing with no gross blood or drainage.  No calf tenderness.  Negative Homans' sign.  Intact ankle dorsiflexion, plantarflexion, palpable pedal pulses of the operative extremity. ? ?Labs: ?Recent Labs  ?  12/26/21 ?0956 12/27/21 ?0229  ?HGB 8.8* 8.9*  ? ?Recent Labs  ?  12/26/21 ?0956 12/27/21 ?0229  ?WBC 5.5 5.6  ?RBC 3.05* 3.04*  ?HCT 29.5* 29.2*  ?PLT 226 205  ? ?Recent Labs  ?  12/26/21 ?0956  ?NA 135  ?K 4.0  ?CL 102  ?CO2 24  ?BUN 15  ?CREATININE 0.92  ?GLUCOSE 113*  ?CALCIUM 9.4  ? ?No results for input(s): LABPT, INR in the last 72 hours. ? ?Assessment/Plan: ?Patient is a 71 year old female who presents s/p right patella fracture ORIF.  She is POD 1.  Weight-bear as tolerated with crutches or a walker (seems she is using a platform walker).  Plan to progress her with physical therapy and okay to discharge home likely tomorrow barring any setbacks with physical  therapy.  We will place home health physical therapy order, appreciate case manager and PT setting her up for home therapy ? ? ?Gerrianne Scale Drianna Chandran ?12/27/2021, 5:59 PM  ? ? ? ? ?

## 2021-12-27 NOTE — Anesthesia Postprocedure Evaluation (Signed)
Anesthesia Post Note ? ?Patient: [name] ? ?Procedure(s) Performed: RIGHT OPEN REDUCTION INTERNAL (ORIF) FIXATION PATELLA (Right: Knee) ? ?  ? ?Patient location during evaluation: PACU ?Anesthesia Type: Spinal ?Level of consciousness: awake and alert ?Pain management: pain level controlled ?Vital Signs Assessment: post-procedure vital signs reviewed and stable ?Respiratory status: spontaneous breathing, nonlabored ventilation and respiratory function stable ?Cardiovascular status: blood pressure returned to baseline and stable ?Postop Assessment: no apparent nausea or vomiting ?Anesthetic complications: no ? ? ?No notable events documented. ? ?Last Vitals:  ?Vitals:  ? 12/27/21 0729 12/27/21 1526  ?BP: 132/64 (!) 146/62  ?Pulse: 78 92  ?Resp: 17 17  ?Temp: 37.2 ?C 37 ?C  ?SpO2: 96% (!) 84%  ?  ?Last Pain:  ?Vitals:  ? 12/27/21 1718  ?TempSrc:   ?PainSc: 3   ? ?Pain Goal: Patients Stated Pain Goal: 3 (12/27/21 1631) ? ?  ?  ?  ?  ?  ?  ?  ? ?Lynda Rainwater ? ? ? ? ?

## 2021-12-28 ENCOUNTER — Encounter (HOSPITAL_COMMUNITY): Payer: Self-pay | Admitting: Orthopedic Surgery

## 2021-12-28 ENCOUNTER — Observation Stay (HOSPITAL_COMMUNITY): Payer: Medicare HMO

## 2021-12-28 DIAGNOSIS — Z85118 Personal history of other malignant neoplasm of bronchus and lung: Secondary | ICD-10-CM | POA: Diagnosis not present

## 2021-12-28 DIAGNOSIS — R262 Difficulty in walking, not elsewhere classified: Secondary | ICD-10-CM | POA: Diagnosis not present

## 2021-12-28 DIAGNOSIS — S82041A Displaced comminuted fracture of right patella, initial encounter for closed fracture: Secondary | ICD-10-CM | POA: Diagnosis not present

## 2021-12-28 DIAGNOSIS — Z87891 Personal history of nicotine dependence: Secondary | ICD-10-CM | POA: Diagnosis not present

## 2021-12-28 DIAGNOSIS — Z8673 Personal history of transient ischemic attack (TIA), and cerebral infarction without residual deficits: Secondary | ICD-10-CM | POA: Diagnosis not present

## 2021-12-28 DIAGNOSIS — I5043 Acute on chronic combined systolic (congestive) and diastolic (congestive) heart failure: Secondary | ICD-10-CM | POA: Diagnosis not present

## 2021-12-28 DIAGNOSIS — I110 Hypertensive heart disease with heart failure: Secondary | ICD-10-CM | POA: Diagnosis not present

## 2021-12-28 DIAGNOSIS — Z79899 Other long term (current) drug therapy: Secondary | ICD-10-CM | POA: Diagnosis not present

## 2021-12-28 DIAGNOSIS — S82001D Unspecified fracture of right patella, subsequent encounter for closed fracture with routine healing: Secondary | ICD-10-CM | POA: Diagnosis not present

## 2021-12-28 DIAGNOSIS — E039 Hypothyroidism, unspecified: Secondary | ICD-10-CM | POA: Diagnosis not present

## 2021-12-28 DIAGNOSIS — Z91040 Latex allergy status: Secondary | ICD-10-CM | POA: Diagnosis not present

## 2021-12-28 LAB — CBC
HCT: 27.3 % — ABNORMAL LOW (ref 36.0–46.0)
Hemoglobin: 8.5 g/dL — ABNORMAL LOW (ref 12.0–15.0)
MCH: 29.1 pg (ref 26.0–34.0)
MCHC: 31.1 g/dL (ref 30.0–36.0)
MCV: 93.5 fL (ref 80.0–100.0)
Platelets: 187 10*3/uL (ref 150–400)
RBC: 2.92 MIL/uL — ABNORMAL LOW (ref 3.87–5.11)
RDW: 16.4 % — ABNORMAL HIGH (ref 11.5–15.5)
WBC: 6 10*3/uL (ref 4.0–10.5)
nRBC: 0 % (ref 0.0–0.2)

## 2021-12-28 MED ORDER — ASPIRIN 81 MG PO CHEW: 81.0000 mg | CHEWABLE_TABLET | Freq: Two times a day (BID) | ORAL | 0 refills | Status: AC

## 2021-12-28 MED ORDER — OXYCODONE-ACETAMINOPHEN 10-325 MG PO TABS: 1.0000 | ORAL_TABLET | Freq: Four times a day (QID) | ORAL | 0 refills | Status: DC | PRN

## 2021-12-28 MED ORDER — DOCUSATE SODIUM 100 MG PO CAPS: 100.0000 mg | ORAL_CAPSULE | Freq: Two times a day (BID) | ORAL | 0 refills | Status: AC

## 2021-12-28 NOTE — Progress Notes (Signed)
This pt has also been refusing "Ice Man" and pt was educated.  ?

## 2021-12-28 NOTE — Progress Notes (Addendum)
Physical Therapy Treatment ?Patient Details ?Name: [name] ?MRN: [id number] ?DOB: Aug 18, 1951 ?Today's Date: 12/28/2021 ? ? ?History of Present Illness Patient is a 71 y/o female admitted after fall a week ago and positive for R comminuted patellar fracture now s/p ORIF.  PMH positive for CHF, PVD, Peripheral neuropathy, DVT, GERD, NSCLC s/p chemo/XRT, amputation of R hand, DM, HTN, MI, tobacco abuse, tremor. ? ?  ?PT Comments  ? ? Pt require min assist to sit EOB. She reported feeling like she was going to pass out secondary to decreased SPaO2. After supplemental O2 pt was feeling better and her vitals stabilized. She was able to ambulate further than the minimal distance needed to get into her house with min guard assist. She did take standing breaks during ambulation. She was having pain with movement but did not limit session. During treatment session patient showed deficits in strength, endurance, activity tolerance, and cognition. Recommending therapy services with home health physical therapy to address the previously stated deficits. Will continue to follow acutely to maximize functional mobility, independence and safety. ?  ?Recommendations for follow up therapy are one component of a multi-disciplinary discharge planning process, led by the attending physician.  Recommendations may be updated based on patient status, additional functional criteria and insurance authorization. ? ?Follow Up Recommendations ? Home health PT ?  ?  ?Assistance Recommended at Discharge Frequent or constant Supervision/Assistance  ?Patient can return home with the following A little help with walking and/or transfers;A little help with bathing/dressing/bathroom;Direct supervision/assist for medications management;Direct supervision/assist for financial management;Help with stairs or ramp for entrance;Assistance with cooking/housework;Assist for transportation ?  ?Equipment Recommendations ? Rolling walker (2 wheels);Other  (comment) (With R UE platform)  ?  ?Recommendations for Other Services   ? ? ?  ?Precautions / Restrictions Precautions ?Precautions: Fall ?Required Braces or Orthoses: Knee Immobilizer - Right ?Knee Immobilizer - Right: On at all times ?Restrictions ?Weight Bearing Restrictions: Yes ?RLE Weight Bearing: Weight bearing as tolerated  ?  ? ?Mobility ? Bed Mobility ?Overal bed mobility: Needs Assistance ?Bed Mobility: Supine to Sit ?  ?  ?Supine to sit: Mod assist ?  ?  ?General bed mobility comments: assist for R LE and to lift trunk. ?  ? ?Transfers ?Overall transfer level: Needs assistance ?Equipment used: Right platform walker ?Transfers: Sit to/from Stand ?Sit to Stand: Min assist ?  ?  ?  ?  ?  ?General transfer comment: required min assist and cues for foot and hand placement during sit to stand transfer. ?  ? ?Ambulation/Gait ?Ambulation/Gait assistance: Min guard ?Gait Distance (Feet): 60 Feet (with standing breaks.) ?Assistive device: Right platform walker ?Gait Pattern/deviations: Step-to pattern, Decreased stride length ?  ?  ?  ?General Gait Details: Pt was able to ambulated with Right platform walker with min guard. She was on 2 L of supplemental O2. She was easily distracted by posters in the hallway. ? ? ?Stairs ?  ?  ?  ?  ?  ? ? ?Wheelchair Mobility ?  ? ?Modified Rankin (Stroke Patients Only) ?  ? ? ?  ?Balance Overall balance assessment: Needs assistance ?Sitting-balance support: Feet unsupported, Bilateral upper extremity supported ?Sitting balance-Leahy Scale: Fair ?Sitting balance - Comments: Pt required supervision and at time min gurad due to feelings of passing out due to low SPaO2 ?  ?Standing balance support: Bilateral upper extremity supported ?Standing balance-Leahy Scale: Poor ?Standing balance comment: Min assist for balance upon standing, was able to progress to min guard. Pt  was min guard to ambulation. Heavy reliance on walker. ?  ?  ?  ?  ?  ?  ?  ?  ?  ?  ?  ?  ? ?  ?Cognition  Arousal/Alertness: Awake/alert ?Behavior During Therapy: [facility] for tasks assessed/performed ?Overall Cognitive Status: No family/caregiver present to determine baseline cognitive functioning ?Area of Impairment: Problem solving, Safety/judgement, Memory ?  ?  ?  ?  ?  ?  ?  ?  ?  ?  ?Memory: Decreased short-term memory ?  ?  ?  ?  ?General Comments: Pt's memory was improved today. She reported not eating yesterday; unsure if she does not remember or could not figure out how to order food to her room. ?  ?  ? ?  ?Exercises   ? ?  ?General Comments   ?  ?  ? ?Pertinent Vitals/Pain Pain Assessment ?Pain Assessment: Faces ?Faces Pain Scale: Hurts little more ?Pain Location: R knee, denies pain at first but grimaces with movment ?Pain Descriptors / Indicators: Aching, Grimacing, Operative site guarding  ? ? ?Home Living   ?  ?  ?  ?  ?  ?  ?  ?  ?  ?   ?  ?Prior Function    ?  ?  ?   ? ?PT Goals (current goals can now be found in the care plan section)   ? ?  ?Frequency ? ? ? Min 5X/week ? ? ? ?  ?PT Plan    ? ? ?Co-evaluation   ?  ?  ?  ?  ? ?  ?AM-PAC PT "6 Clicks" Mobility   ?Outcome Measure ? Help needed turning from your back to your side while in a flat bed without using bedrails?: A Little ?Help needed moving from lying on your back to sitting on the side of a flat bed without using bedrails?: A Lot ?Help needed moving to and from a bed to a chair (including a wheelchair)?: A Little ?Help needed standing up from a chair using your arms (e.g., wheelchair or bedside chair)?: A Little ?Help needed to walk in hospital room?: A Little ?Help needed climbing 3-5 steps with a railing? : Total ?6 Click Score: 15 ? ?  ?End of Session Equipment Utilized During Treatment: Gait belt;Right knee immobilizer ?Activity Tolerance: Patient limited by pain ?Patient left: in chair;with call bell/phone within reach;with chair alarm set ?  ?PT Visit Diagnosis: Other abnormalities of gait and mobility (R26.89);Pain;Difficulty in walking,  not elsewhere classified (R26.2);History of falling (Z91.81) ?Pain - Right/Left: Right ?Pain - part of body: Knee ?  ? ? ?Time: 4462-8638 ?PT Time Calculation (min) (ACUTE ONLY): 44 min ? ?Charges:  $Gait Training: 8-22 mins ?$Therapeutic Activity: 23-37 mins          ?          ? ?Quenton Fetter, SPT ? ? ? ?Quenton Fetter ?12/28/2021, 2:25 PM ? ?

## 2021-12-28 NOTE — Progress Notes (Signed)
Pt has been ready for discharge however there was miscommunication with the pts ride which kept pt in the unit tonight. Pts friend [name] said that she will be available to pick pt up tomorrow at [place]. We will have the pt ready for discharge by 8AM tomorrow. Will continue to monitor pt. ?

## 2021-12-28 NOTE — Progress Notes (Signed)
?  Subjective: ?Patient is a 71 year old female who presents POD 1 s/p right patella fracture ORIF.  Doing well overall.  No complaint of chest pain or abnormal shortness of breath.  She does have pulmonary history but no new shortness of breath.  No abdominal pain.  Passing gas well.  Did have a bowel movement this morning.  Denies any calf pain.  She has not really ambulated much with therapy but was able to transfer from her bed to her chair.  Oxygen saturation dropped a couple times earlier today but overall is maintaining well.  ? ?Objective: ?Vital signs in last 24 hours: ?Temp:  [97.7 ?F (36.5 ?C)-98 ?F (36.7 ?C)] 98 ?F (36.7 ?C) (03/16 1357) ?Pulse Rate:  [71-97] 71 (03/16 1357) ?Resp:  [17-18] 17 (03/16 1357) ?BP: (120-140)/(48-70) 120/48 (03/16 1357) ?SpO2:  [87 %-100 %] 87 % (03/16 1357) ? ?Intake/Output from previous day: ?03/15 0701 - 03/16 0700 ?In: 360 [P.O.:360] ?Out: 4 [Urine:4] ?Intake/Output this shift: ?Total I/O ?In: 240 [P.O.:240] ?Out: -  ? ?Exam: ? ?Ortho exam demonstrates postoperative dressing with no gross blood or drainage.  No calf tenderness.  Negative Homans' sign.  Intact ankle dorsiflexion, plantarflexion, palpable pedal pulses of the operative extremity. ? ?Labs: ?Recent Labs  ?  12/26/21 ?0956 12/27/21 ?0229 12/28/21 ?0232  ?HGB 8.8* 8.9* 8.5*  ? ?Recent Labs  ?  12/27/21 ?0229 12/28/21 ?0232  ?WBC 5.6 6.0  ?RBC 3.04* 2.92*  ?HCT 29.2* 27.3*  ?PLT 205 187  ? ?Recent Labs  ?  12/26/21 ?0956  ?NA 135  ?K 4.0  ?CL 102  ?CO2 24  ?BUN 15  ?CREATININE 0.92  ?GLUCOSE 113*  ?CALCIUM 9.4  ? ?No results for input(s): LABPT, INR in the last 72 hours. ? ?Assessment/Plan: ?Patient is a 71 year old female who presents s/p right patella fracture ORIF.  She is POD 1.  Weight-bear as tolerated with crutches or a walker (seems she is using a platform walker).  Plan to progress her with physical therapy and okay to discharge home likely tomorrow barring any setbacks with physical therapy.  We will  place home health physical therapy order, appreciate case manager and PT setting her up for home therapy ? ? ?Gerrianne Scale Aubry Tucholski ?12/28/2021, 4:34 PM  ? ? ? ? ?

## 2021-12-28 NOTE — Progress Notes (Signed)
Pt refusing to wear O2 but sating at 95 on room air. ?

## 2021-12-28 NOTE — Progress Notes (Signed)
Pt found sitting on floor, said she did not fall. Chair alarm was on. Dr. Marlou Sa PA Lurena Joiner, [employer] was notified. CNA will be placed outside of Pt room. Knee Immobilizer was on Pt. Vitals were stable no sign of injury or increased pain.  ?

## 2021-12-29 DIAGNOSIS — E039 Hypothyroidism, unspecified: Secondary | ICD-10-CM | POA: Diagnosis not present

## 2021-12-29 DIAGNOSIS — Z87891 Personal history of nicotine dependence: Secondary | ICD-10-CM | POA: Diagnosis not present

## 2021-12-29 DIAGNOSIS — R262 Difficulty in walking, not elsewhere classified: Secondary | ICD-10-CM | POA: Diagnosis not present

## 2021-12-29 DIAGNOSIS — Z85118 Personal history of other malignant neoplasm of bronchus and lung: Secondary | ICD-10-CM | POA: Diagnosis not present

## 2021-12-29 DIAGNOSIS — S82041A Displaced comminuted fracture of right patella, initial encounter for closed fracture: Secondary | ICD-10-CM | POA: Diagnosis not present

## 2021-12-29 DIAGNOSIS — Z8673 Personal history of transient ischemic attack (TIA), and cerebral infarction without residual deficits: Secondary | ICD-10-CM | POA: Diagnosis not present

## 2021-12-29 DIAGNOSIS — J849 Interstitial pulmonary disease, unspecified: Secondary | ICD-10-CM | POA: Diagnosis not present

## 2021-12-29 DIAGNOSIS — Z79899 Other long term (current) drug therapy: Secondary | ICD-10-CM | POA: Diagnosis not present

## 2021-12-29 DIAGNOSIS — J9611 Chronic respiratory failure with hypoxia: Secondary | ICD-10-CM | POA: Diagnosis not present

## 2021-12-29 DIAGNOSIS — Z91040 Latex allergy status: Secondary | ICD-10-CM | POA: Diagnosis not present

## 2021-12-29 DIAGNOSIS — I110 Hypertensive heart disease with heart failure: Secondary | ICD-10-CM | POA: Diagnosis not present

## 2021-12-29 DIAGNOSIS — I5043 Acute on chronic combined systolic (congestive) and diastolic (congestive) heart failure: Secondary | ICD-10-CM | POA: Diagnosis not present

## 2022-01-01 ENCOUNTER — Telehealth: Payer: Self-pay | Admitting: Radiology

## 2022-01-01 NOTE — Telephone Encounter (Signed)
IC advised ok for orders and WBAT with immobilizer per Lurena Joiner ?

## 2022-01-01 NOTE — Telephone Encounter (Signed)
Raquel Sarna, PT with [facility], just saw patient for start of care. He is requesting verbal orders for PT 2/wk x 3wks and 1/wk x 4wks.  He would also like to clarify patient's weightbearing status. ? ?Please call Shawn at [phone] to advise. ?

## 2022-01-03 ENCOUNTER — Encounter: Payer: Medicare HMO | Admitting: Orthopedic Surgery

## 2022-01-03 ENCOUNTER — Other Ambulatory Visit: Payer: Self-pay

## 2022-01-03 ENCOUNTER — Encounter: Payer: Self-pay | Admitting: Orthopedic Surgery

## 2022-01-03 ENCOUNTER — Ambulatory Visit (INDEPENDENT_AMBULATORY_CARE_PROVIDER_SITE_OTHER): Payer: Medicare HMO

## 2022-01-03 ENCOUNTER — Other Ambulatory Visit: Payer: Self-pay | Admitting: Surgical

## 2022-01-03 ENCOUNTER — Ambulatory Visit (INDEPENDENT_AMBULATORY_CARE_PROVIDER_SITE_OTHER): Payer: Medicare HMO | Admitting: Orthopedic Surgery

## 2022-01-03 DIAGNOSIS — S82041A Displaced comminuted fracture of right patella, initial encounter for closed fracture: Secondary | ICD-10-CM

## 2022-01-03 DIAGNOSIS — S82001D Unspecified fracture of right patella, subsequent encounter for closed fracture with routine healing: Secondary | ICD-10-CM | POA: Diagnosis not present

## 2022-01-03 DIAGNOSIS — E039 Hypothyroidism, unspecified: Secondary | ICD-10-CM | POA: Diagnosis not present

## 2022-01-03 DIAGNOSIS — D509 Iron deficiency anemia, unspecified: Secondary | ICD-10-CM | POA: Diagnosis not present

## 2022-01-03 MED ORDER — OXYCODONE-ACETAMINOPHEN 10-325 MG PO TABS: 1.0000 | ORAL_TABLET | Freq: Four times a day (QID) | ORAL | 0 refills | Status: DC | PRN

## 2022-01-03 NOTE — Progress Notes (Signed)
Post-Op Visit Note   Patient: [name]           Date of Birth: 1951-07-30           MRN: [id number] Visit Date: 01/03/2022 PCP: Benita Stabile, MD   Assessment & Plan:  Chief Complaint:  Chief Complaint  Patient presents with   Right Knee - Routine Post Op   Visit Diagnoses:  1. Closed displaced comminuted fracture of right patella, initial encounter     Plan: [name] is a 71 year old patient who is now about 10 days out from right knee patella fracture.  Patient has been weightbearing as tolerated in knee immobilizer.  On exam incision looks good.  Dependent erythema around the midportion of the incision with no effusion.  Patient is able to do a straight leg raise.  Negative Homans no calf tenderness.  Plan is to continue weightbearing as tolerated in the knee immobilizer.  Come back in 2 weeks for AP lateral radiographs of the right patella and start physical therapy and range of motion exercises at that time.  Follow-Up Instructions: Return in about 2 weeks (around 01/17/2022).   Orders:  Orders Placed This Encounter  Procedures   XR Knee 1-2 Views Right   No orders of the defined types were placed in this encounter.   Imaging: XR Knee 1-2 Views Right  Result Date: 01/03/2022 AP lateral radiographs right knee reviewed.  Patella fracture fixation has been performed with good reduction of the fracture.  No complicating features with hardware.  No acute fractures.  Bones are osteopenic.   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) 03/27/2021   Brain mass 02/28/2021   Acute on chronic diastolic CHF (congestive heart failure) (HCC) 02/28/2021   Brain metastases (HCC)    Small cell lung cancer, right middle lobe (HCC) 05/30/2020   Encounter for antineoplastic chemotherapy 05/30/2020   Goals of care, counseling/discussion 05/30/2020   Intolerance to cold 05/23/2020    Mediastinal adenopathy 05/20/2020   Abnormal weight loss 05/17/2020   IDA (iron deficiency anemia) 02/15/2020   Hepatic cirrhosis (HCC) 02/15/2020   CKD (chronic kidney disease), stage IIIa 01/09/2020   Lung mass 01/09/2020   Orthostatic hypotension 12/31/2019   Non-ST elevation (NSTEMI) myocardial infarction (HCC)    Anemia    Dyspnea    Hx of heart artery stent    Benign hypertension with CKD (chronic kidney disease) stage III (HCC)    Former smoker    Chest pain 12/20/2019   Weakness 09/04/2014   Dizziness 09/04/2014   Hepatomegaly 03/31/2014   Esophageal dysphagia 03/31/2014   Unspecified constipation 03/31/2014   AKI (acute kidney injury) (HCC) 10/30/2013   Altered mental status 09/28/2013   ETOH abuse 09/28/2013   Altered mental state 09/28/2013   Hyperkalemia 06/05/2013   Bradycardia 06/05/2013   Edema 06/02/2013   Hyponatremia 06/02/2013   Pneumonia due to infectious agent 12/26/2012   Insomnia due to mental disorder 09/02/2012   RECTAL PAIN 09/04/2010   CHEST PAIN 08/22/2010   Insulin dependent type 2 diabetes mellitus (HCC) 06/16/2010   Coronary artery disease/Prior Stents 1997 and 2000/RCA occlusion 12/2019 06/16/2010   MICROALBUMINURIA 06/01/2009   ACTINIC KERATOSIS, HEAD 04/20/2009   OTHER DYSPHAGIA 01/05/2009   COLONIC POLYPS, ADENOMATOUS, HX OF 01/05/2009   SCHATZKI'S RING, HX OF 01/05/2009   BACK PAIN 08/04/2008   DIARRHEA, CHRONIC 08/04/2008   TOBACCO ABUSE 06/03/2007  IBS 06/03/2007   NEPHROLITHIASIS 02/04/2007   ANXIETY STATE NOS 11/26/2006   Hypothyroidism 11/01/2006   Mixed hyperlipidemia 11/01/2006   Depression 11/01/2006   PERIPHERAL NEUROPATHY 11/01/2006   CATARACT NOS 11/01/2006   ALLERGIC RHINITIS 11/01/2006   COPD (chronic obstructive pulmonary disease) (HCC) 11/01/2006   GERD 11/01/2006   DEGENERATION, DISC NOS 11/01/2006   LOW BACK PAIN 11/01/2006   DVT, HX OF 11/01/2006   Past Medical History:  Diagnosis Date   Acute on chronic  diastolic CHF (congestive heart failure) (HCC) 02/28/2021   Allergic rhinitis    Amputation of hand, right (HCC) 1984   Anemia    Anxiety    ASCVD (arteriosclerotic cardiovascular disease)    MI in 96 requiring BMS CX; DES to M1 in 2000;normal coronary angiography in 2004   Cholelithiasis    COPD (chronic obstructive pulmonary disease) (HCC)    DDD (degenerative disc disease), lumbar    Depression    Diabetes mellitus    Type II   Diarrhea    DVT (deep venous thrombosis) (HCC)    patient said no   GERD (gastroesophageal reflux disease)    Headache    migraine   History of kidney stones    2006   History of radiation therapy 06/28/2020-08/08/2020   right Lung; Dr. Antony Blackbird   History of radiation therapy 03/22/2021   brain 03/02/2021-03/22/2021   Dr Antony Blackbird   Hyperlipidemia    Hypertension    Hypotension    Hypothyroidism    IBS (irritable bowel syndrome)    Low back pain    Myocardial infarction [facility]) 01/1995   Nephrolithiasis 2006   stone extraction    Panic attacks    Peripheral neuropathy    Peripheral vascular disease (HCC)    legs   Pneumonia 12/22/2019   Sciatic pain    right   Small cell lung cancer (HCC)    Tobacco abuse    Tremor    Weakness of extremity    left hand weakness    Family History  Problem Relation Age of Onset   Depression Mother    Bipolar disorder Mother    Dementia Mother    Pulmonary fibrosis Mother    Alcohol abuse Father    Aneurysm Father        deceased age 55, brain   Colon cancer Paternal Grandfather        age greater than 37   Healthy Child     Past Surgical History:  Procedure Laterality Date   AGILE CAPSULE N/A 03/07/2020   Procedure: AGILE CAPSULE;  Surgeon: Corbin Ade, MD;  [facility];  Service: Endoscopy;  Laterality: N/A;  7:30am   BACK SURGERY     fusion   BRONCHIAL NEEDLE ASPIRATION BIOPSY N/A 05/20/2020   Procedure: BRONCHIAL NEEDLE ASPIRATION BIOPSIES;  Surgeon: Leslye Peer, MD;   [facility] ENDOS[facility];  Service: Pulmonary;  Laterality: N/A;   CHOLECYSTECTOMY     COLONOSCOPY  01/2009   JXB:[id number] rectum/repeat in 5 yrs   COLONOSCOPY N/A 04/29/2014   Dr.Rourk- attempted/incomplete colonoscopy. inadequate prep   COLONOSCOPY N/A 05/27/2014   FAO:[id number] coli. Colonic polyps-removed as described above.Status post segmental biopsy. single tubular adenoma and random colon bx neg   COLONOSCOPY WITH PROPOFOL N/A 12/23/2019   Procedure: COLONOSCOPY WITH PROPOFOL;  Surgeon: Kerin Salen, MD;  Location: [facility] ENDOSCOPY;  Service: Gastroenterology;  Laterality: N/A;   DILATION AND CURETTAGE OF UTERUS  1974   ESOPHAGOGASTRODUODENOSCOPY  05/2010  Dr. Sanjuana Letters, erosion. 24F dilation   ESOPHAGOGASTRODUODENOSCOPY N/A 04/29/2014   Dr.Rourk- normal esophagus s/p passage of maloney dilator. small hiatal hernia- bx= chronic inflammation.   ESOPHAGOGASTRODUODENOSCOPY (EGD) WITH PROPOFOL N/A 12/23/2019   Procedure: ESOPHAGOGASTRODUODENOSCOPY (EGD) WITH PROPOFOL;  Surgeon: Kerin Salen, MD;  Location: [facility] ENDOSCOPY;  Service: Gastroenterology;  Laterality: N/A;   GIVENS CAPSULE STUDY N/A 03/22/2020   Procedure: GIVENS CAPSULE STUDY;  Surgeon: Corbin Ade, MD;  [facility];  Service: Endoscopy;  Laterality: N/A;  7:30am   HEMOSTASIS CLIP PLACEMENT  12/23/2019   Procedure: HEMOSTASIS CLIP PLACEMENT;  Surgeon: Kerin Salen, MD;  Location: [facility] ENDOSCOPY;  Service: Gastroenterology;;   HOT HEMOSTASIS N/A 12/23/2019   Procedure: HOT HEMOSTASIS (ARGON PLASMA COAGULATION/BICAP);  Surgeon: Kerin Salen, MD;  Location: [facility] ENDOSCOPY;  Service: Gastroenterology;  Laterality: N/A;   KNEE SURGERY Left    LEFT HEART CATH AND CORONARY ANGIOGRAPHY N/A 12/24/2019   Procedure: LEFT HEART CATH AND CORONARY ANGIOGRAPHY;  Surgeon: Elder Negus, MD;  [facility] INVAS[facility] CV LAB;  Service: Cardiovascular;  Laterality: N/A;   MALONEY DILATION N/A 04/29/2014   Procedure: Elease Hashimoto DILATION;   Surgeon: Corbin Ade, MD;  [facility];  Service: Endoscopy;  Laterality: N/A;   ORIF PATELLA Right 12/26/2021   Procedure: RIGHT OPEN REDUCTION INTERNAL (ORIF) FIXATION PATELLA;  Surgeon: Cammy Copa, MD;  Location: [facility] OR;  Service: Orthopedics;  Laterality: Right;   PARTIAL HYSTERECTOMY  1978   POLYPECTOMY  12/23/2019   Procedure: POLYPECTOMY;  Surgeon: Kerin Salen, MD;  Location: [facility] ENDOSCOPY;  Service: Gastroenterology;;   SHOULDER SURGERY Left    Left shoulder for RTC;left arm surgery '98/left hand surgery 2001   TOTAL ABDOMINAL HYSTERECTOMY W/ BILATERAL SALPINGOOPHORECTOMY  2002   UMBILICAL HERNIA REPAIR  2008   VIDEO BRONCHOSCOPY WITH ENDOBRONCHIAL ULTRASOUND N/A 05/20/2020   Procedure: VIDEO BRONCHOSCOPY WITH ENDOBRONCHIAL ULTRASOUND;  Surgeon: Leslye Peer, MD;  [facility];  Service: Pulmonary;  Laterality: N/A;   Social History   Occupational History   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

## 2022-01-04 ENCOUNTER — Other Ambulatory Visit (HOSPITAL_COMMUNITY): Payer: Self-pay | Admitting: Family Medicine

## 2022-01-04 DIAGNOSIS — S82001D Unspecified fracture of right patella, subsequent encounter for closed fracture with routine healing: Secondary | ICD-10-CM

## 2022-01-05 ENCOUNTER — Telehealth: Payer: Self-pay

## 2022-01-05 NOTE — Telephone Encounter (Signed)
I tried calling. No answer. LMVM advising could text photo to my phone so that Luke/Dr Marlou Sa could see it or have family send via mychart if patient has mychart.  ?

## 2022-01-05 NOTE — Telephone Encounter (Signed)
[name],PT with [facility] called stating that when bandage was removed from right knee and that patient has some redness on the distal and medial side and out.  CB# [phone].  Please advise  ?

## 2022-01-05 NOTE — Telephone Encounter (Signed)
She had this redness around the incision at her recent office visit and it did not seem infectious. Seems more related ot the swelling.  Incision was healing well.  Redness improved with elevation.

## 2022-01-08 ENCOUNTER — Ambulatory Visit: Payer: Medicare HMO | Admitting: Pulmonary Disease

## 2022-01-09 ENCOUNTER — Other Ambulatory Visit: Payer: Self-pay

## 2022-01-09 ENCOUNTER — Ambulatory Visit: Payer: Medicare HMO | Admitting: Internal Medicine

## 2022-01-09 ENCOUNTER — Encounter: Payer: Self-pay | Admitting: Internal Medicine

## 2022-01-09 VITALS — BP 116/63 | HR 100 | Temp 97.7°F | Ht 66.0 in | Wt 124.6 lb

## 2022-01-09 DIAGNOSIS — D509 Iron deficiency anemia, unspecified: Secondary | ICD-10-CM | POA: Diagnosis not present

## 2022-01-09 DIAGNOSIS — K7460 Unspecified cirrhosis of liver: Secondary | ICD-10-CM

## 2022-01-09 NOTE — Progress Notes (Signed)
? ? ?Primary Care Physician:  [name], MD ?Primary Gastroenterologist:  Dr.  Marland Kitchen ?Pre-Procedure History & Physical: ?HPI:  [name] is a 71 y.o. female here for evaluation of a notable drop in H&H recently.  Lady with multiple comorbidities including metastatic lung cancer to the brain  - status post XRT, interstitial fibrosis.,  NASH cirrhosis (well compensated) noted to have decline in her H&H from the 11 range to 9.8/30.6 last month.  On 3/22 her H&H further declined to 8.8 ferritin 49 iron saturation 9% (iron 27 and IBC 310). ? ?Patient has not had any melena or rectal bleeding.  History of chronic diarrhea controlled with Colestid she also takes opioids for pain control.  Has 1 bowel movement about every other day denies NSAIDs.  Denies abdominal pain nausea vomiting odynophagia, dysphagia, early satiety .  More short of breath recently.  Has little exercise tolerance.  Has been chronically hypoxemic recently had multiple falls.  She has oxygen concentrator at home. ? ?GI history is pertinent for chronic diarrhea over the years evaluated extensively. ? ?GI bleed requiring multiple units 2021.  Worked up here and in [place]; EGD/colonoscopy demonstrated AVMs that underwent APC.  Incidentally,  no endoscopic stigmata of portal hypertension previously,  no history of portal gastropathy or esophageal varices.  Her platelet count remains normal; her albumin remains normal. ? ?She has a history of pulmonary fibrosis and history of aortic thrombus bilateral renal artery stenosis.  No specific therapy has been recommended. ? ? ?Past Medical History:  ?Diagnosis Date  ? Acute on chronic diastolic CHF (congestive heart failure) ([place]) 02/28/2021  ? Allergic rhinitis   ? Amputation of hand, right ([place]) 1984  ? Anemia   ? Anxiety   ? ASCVD (arteriosclerotic cardiovascular disease)   ? MI in 96 requiring BMS CX; DES to M1 in 2000;normal coronary angiography in 2004  ? Cholelithiasis   ? COPD (chronic  obstructive pulmonary disease) ([place])   ? DDD (degenerative disc disease), lumbar   ? Depression   ? Diabetes mellitus   ? Type II  ? Diarrhea   ? DVT (deep venous thrombosis) ([place])   ? patient said no  ? GERD (gastroesophageal reflux disease)   ? Headache   ? migraine  ? History of kidney stones   ? 2006  ? History of radiation therapy 06/28/2020-08/08/2020  ? right Lung; Dr. [name]  ? History of radiation therapy 03/22/2021  ? brain 03/02/2021-03/22/2021   Dr [name]  ? Hyperlipidemia   ? Hypertension   ? Hypotension   ? Hypothyroidism   ? IBS (irritable bowel syndrome)   ? Low back pain   ? Myocardial infarction [facility]) 01/1995  ? Nephrolithiasis 2006  ? stone extraction   ? Panic attacks   ? Peripheral neuropathy   ? Peripheral vascular disease ([place])   ? legs  ? Pneumonia 12/22/2019  ? Sciatic pain   ? right  ? Small cell lung cancer ([place])   ? Tobacco abuse   ? Tremor   ? Weakness of extremity   ? left hand weakness  ? ? ?Past Surgical History:  ?Procedure Laterality Date  ? AGILE CAPSULE N/A 03/07/2020  ? Procedure: AGILE CAPSULE;  Surgeon: Daneil Dolin, MD;  [facility];  Service: Endoscopy;  Laterality: N/A;  7:30am  ? BACK SURGERY    ? fusion  ? BRONCHIAL NEEDLE ASPIRATION BIOPSY N/A 05/20/2020  ? Procedure: BRONCHIAL NEEDLE ASPIRATION BIOPSIES;  Surgeon: Lamonte Sakai,  [name] Fillers, MD;  Location: [facility] ENDOSCOPY;  Service: Pulmonary;  Laterality: N/A;  ? CHOLECYSTECTOMY    ? COLONOSCOPY  01/2009  ? STM:[id number] rectum/repeat in 5 yrs  ? COLONOSCOPY N/A 04/29/2014  ? Dr.Zita Ozimek- attempted/incomplete colonoscopy. inadequate prep  ? COLONOSCOPY N/A 05/27/2014  ? LNL:GXQJJHERD coli. Colonic polyps-removed as described above.Status post segmental biopsy. single tubular adenoma and random colon bx neg  ? COLONOSCOPY WITH PROPOFOL N/A 12/23/2019  ? Procedure: COLONOSCOPY WITH PROPOFOL;  Surgeon: Ronnette Juniper, MD;  Location: [place];  Service: Gastroenterology;  Laterality: N/A;  ? [place] OF  UTERUS  1974  ? ESOPHAGOGASTRODUODENOSCOPY  05/2010  ? Dr. Tessie Fass, erosion. 89F dilation  ? ESOPHAGOGASTRODUODENOSCOPY N/A 04/29/2014  ? Dr.Megen Madewell- normal esophagus s/p passage of maloney dilator. small hiatal hernia- bx= chronic inflammation.  ? ESOPHAGOGASTRODUODENOSCOPY (EGD) WITH PROPOFOL N/A 12/23/2019  ? Procedure: ESOPHAGOGASTRODUODENOSCOPY (EGD) WITH PROPOFOL;  Surgeon: Ronnette Juniper, MD;  Location: [place];  Service: Gastroenterology;  Laterality: N/A;  ? GIVENS CAPSULE STUDY N/A 03/22/2020  ? Procedure: GIVENS CAPSULE STUDY;  Surgeon: Daneil Dolin, MD;  [facility];  Service: Endoscopy;  Laterality: N/A;  7:30am  ? HEMOSTASIS CLIP PLACEMENT  12/23/2019  ? Procedure: HEMOSTASIS CLIP PLACEMENT;  Surgeon: Ronnette Juniper, MD;  Location: [place];  Service: Gastroenterology;;  ? HOT HEMOSTASIS N/A 12/23/2019  ? Procedure: HOT HEMOSTASIS (ARGON PLASMA COAGULATION/BICAP);  Surgeon: Ronnette Juniper, MD;  Location: [place];  Service: Gastroenterology;  Laterality: N/A;  ? KNEE SURGERY Left   ? LEFT HEART CATH AND CORONARY ANGIOGRAPHY N/A 12/24/2019  ? Procedure: LEFT HEART CATH AND CORONARY ANGIOGRAPHY;  Surgeon: Nigel Mormon, MD;  Location: [place] CV LAB;  Service: Cardiovascular;  Laterality: N/A;  ? MALONEY DILATION N/A 04/29/2014  ? Procedure: MALONEY DILATION;  Surgeon: Daneil Dolin, MD;  [facility];  Service: Endoscopy;  Laterality: N/A;  ? ORIF PATELLA Right 12/26/2021  ? Procedure: RIGHT OPEN REDUCTION INTERNAL (ORIF) FIXATION PATELLA;  Surgeon: Meredith Pel, MD;  Location: [place];  Service: Orthopedics;  Laterality: Right;  ? PARTIAL HYSTERECTOMY  1978  ? POLYPECTOMY  12/23/2019  ? Procedure: POLYPECTOMY;  Surgeon: Ronnette Juniper, MD;  Location: [place];  Service: Gastroenterology;;  ? SHOULDER SURGERY Left   ? Left shoulder for RTC;left arm surgery '98/left hand surgery 2001  ? TOTAL ABDOMINAL HYSTERECTOMY W/ BILATERAL SALPINGOOPHORECTOMY  2002  ?  UMBILICAL HERNIA REPAIR  2008  ? VIDEO BRONCHOSCOPY WITH ENDOBRONCHIAL ULTRASOUND N/A 05/20/2020  ? Procedure: VIDEO BRONCHOSCOPY WITH ENDOBRONCHIAL ULTRASOUND;  Surgeon: Collene Gobble, MD;  Location: [facility] ENDOSCOPY;  Service: Pulmonary;  Laterality: N/A;  ? ? ?Prior to Admission medications   ?Medication Sig Start Date End Date Taking? Authorizing Provider  ?albuterol (PROVENTIL) (2.5 MG/3ML) 0.083% nebulizer solution Take 3 mLs (2.5 mg total) by nebulization every 6 (six) hours as needed for up to 30 doses for wheezing or shortness of breath. 11/14/21  Yes Chesley Mires, MD  ?albuterol (VENTOLIN HFA) 108 (90 Base) MCG/ACT inhaler Inhale 2 puffs into the lungs every 4 (four) hours as needed for wheezing or shortness of breath. 11/17/20  Yes Chesley Mires, MD  ?aspirin 81 MG chewable tablet Chew 1 tablet (81 mg total) by mouth 2 (two) times daily. 12/28/21  Yes Magnant, Gerrianne Scale, PA-C  ?Benzocaine (BOIL-EASE EX) Apply 1 application. topically daily as needed (Boil).   Yes [provider]  ?carbamide peroxide (DEBROX) 6.5 % OTIC solution Place 5 drops into both ears  daily as needed (ears).   Yes [provider]  ?Cholecalciferol (VITAMIN D3) 50 MCG (2000 UT) TABS Take 8,000 Units by mouth daily.   Yes [provider]  ?colestipol (COLESTID) 1 g tablet TAKE 3 TABLETS BY MOUTH ONCE DAILY. DO NOT TAKE WITHIN TWO HOURS OF OTHER MEDS. 09/13/21  Yes Annitta Needs, NP  ?diazepam (VALIUM) 5 MG tablet Take 2.5 mg by mouth at bedtime. 1/2 tab qhs   Yes [provider]  ?diclofenac Sodium (VOLTAREN) 1 % GEL Apply topically as needed.   Yes [provider]  ?docusate sodium (COLACE) 100 MG capsule Take 1 capsule (100 mg total) by mouth 2 (two) times daily. 12/28/21  Yes Magnant, Charles L, PA-C  ?empagliflozin (JARDIANCE) 25 MG TABS tablet Take 25 mg by mouth daily.   Yes [provider]  ?Ferrous Sulfate (IRON) 325 (65 Fe) MG TABS Take 1 tablet by mouth daily.   Yes [provider]  ?Fluticasone-Umeclidin-Vilant (TRELEGY ELLIPTA) 100-62.5-25 MCG/ACT AEPB Inhale 1 puff into the lungs daily. 11/15/21  Yes Chesley Mires, MD  ?furosemide (LASIX) 20 MG tablet Take 20 mg by mouth daily.

## 2022-01-09 NOTE — Patient Instructions (Addendum)
It was good to see you again today! ? ?You do have a notable decline in your hemoglobin.  Even though I did not detect any blood in your stool today I suspect you are losing blood in small amounts over time related to blood blisters ( AVMs) in your GI tract. ? ?There is no cure. ? ?As discussed, you are not a good candidate to have a repeat colonoscopy or upper endoscopy. ? ?Practically, we should maintain your hemoglobin in an acceptable range.  You may benefit from periodic blood transfusions and continuing iron therapy. ? ?We will check an H&H today and decide whether or not you would benefit from transfusion of 1 unit of blood. ? ?Although you have evidence of cirrhosis, your liver continues to work quite well.  No specific further therapy is recommended. ? ?Further recommendations to follow. ?

## 2022-01-10 ENCOUNTER — Telehealth: Payer: Self-pay | Admitting: Orthopedic Surgery

## 2022-01-10 ENCOUNTER — Telehealth: Payer: Self-pay

## 2022-01-10 ENCOUNTER — Other Ambulatory Visit: Payer: Self-pay

## 2022-01-10 DIAGNOSIS — D509 Iron deficiency anemia, unspecified: Secondary | ICD-10-CM

## 2022-01-10 LAB — HEMOGLOBIN AND HEMATOCRIT, BLOOD
Hematocrit: 28 % — ABNORMAL LOW (ref 34.0–46.6)
Hemoglobin: 8.7 g/dL — ABNORMAL LOW (ref 11.1–15.9)

## 2022-01-10 NOTE — Telephone Encounter (Signed)
Labs that your requested a copy of from Dr. Durene Cal office are on your desk for review.  ?

## 2022-01-10 NOTE — Telephone Encounter (Signed)
Ben (PT) from [facility] called to report pt had a fall. Pt has no new injuries. Ben phone number is [phone] if any questions arise ?

## 2022-01-10 NOTE — Telephone Encounter (Signed)
OV made, appt card mailed ?

## 2022-01-10 NOTE — Telephone Encounter (Signed)
Pt is aware, forwarding to front to schedule.  ?

## 2022-01-10 NOTE — Telephone Encounter (Signed)
-----   Message from Daneil Dolin, MD sent at 01/10/2022  1:16 PM EDT ----- ?Needs 6-8 week f/u with APP. Thx. ? ?

## 2022-01-11 NOTE — Discharge Summary (Signed)
Physician Discharge Summary  ? ? ? ? ?Patient ID: ?[name] ?MRN: [id number] ?DOB/AGE: 03-13-1951 71 y.o. ? ?Admit date: 12/26/2021 ?Discharge date: 12/28/2021 ? ?Admission Diagnoses:  ?Principal Problem: ?  Fracture of patella ? ? ?Discharge Diagnoses:  ?Same ? ?Surgeries: Procedure(s): ?RIGHT OPEN REDUCTION INTERNAL (ORIF) FIXATION PATELLA on 12/26/2021 ?  ?Consultants:  ? ?Discharged Condition: Stable ? ?Hospital Course: [name] is an 71 y.o. female who was admitted 12/26/2021 with a chief complaint of right knee pain, and found to have a diagnosis of right knee patella fracture.  They were brought to the operating room on 12/26/2021 and underwent the above named procedures.  Pt awoke from anesthesia without complication and was transferred to the floor. On POD1, patient's pain was controlled but she somewhat struggled with mobility.  This improved on POD 2 and she was discharged home on POD 2.  She had no complaint of chest pain, shortness of breath, any other medical complaints during her stay aside from soreness after surgery..  Pt will f/u with Dr. Marlou Sa in clinic in ~1 weeks.  ? ?Antibiotics given:  ?Anti-infectives (From admission, onward)  ? ? Start     Dose/Rate Route Frequency Ordered Stop  ? 12/26/21 2200  vancomycin (VANCOCIN) IVPB 1000 mg/200 mL premix       ? 1,000 mg ?200 mL/hr over 60 Minutes Intravenous Every 12 hours 12/26/21 1759 12/27/21 0046  ? 12/26/21 1217  vancomycin (VANCOCIN) powder  Status:  Discontinued       ?   As needed 12/26/21 1218 12/26/21 1246  ? 12/26/21 0930  vancomycin (VANCOCIN) IVPB 1000 mg/200 mL premix       ? 1,000 mg ?200 mL/hr over 60 Minutes Intravenous On call to O.R. 12/26/21 0915 12/26/21 1112  ? ?  ?. ? ?Recent vital signs:  ?Vitals:  ? 12/29/21 0758 12/29/21 0759  ?BP:    ?Pulse:    ?Resp:    ?Temp:    ?SpO2: 96% 96%  ? ? ?Recent laboratory studies:  ?Results for orders placed or performed during the hospital encounter of 12/26/21  ?CBC  ?Result Value  Ref Range  ? WBC 5.5 4.0 - 10.5 K/uL  ? RBC 3.05 (L) 3.87 - 5.11 MIL/uL  ? Hemoglobin 8.8 (L) 12.0 - 15.0 g/dL  ? HCT 29.5 (L) 36.0 - 46.0 %  ? MCV 96.7 80.0 - 100.0 fL  ? MCH 28.9 26.0 - 34.0 pg  ? MCHC 29.8 (L) 30.0 - 36.0 g/dL  ? RDW 16.7 (H) 11.5 - 15.5 %  ? Platelets 226 150 - 400 K/uL  ? nRBC 0.0 0.0 - 0.2 %  ?Basic metabolic panel  ?Result Value Ref Range  ? Sodium 135 135 - 145 mmol/L  ? Potassium 4.0 3.5 - 5.1 mmol/L  ? Chloride 102 98 - 111 mmol/L  ? CO2 24 22 - 32 mmol/L  ? Glucose, Bld 113 (H) 70 - 99 mg/dL  ? BUN 15 8 - 23 mg/dL  ? Creatinine, Ser 0.92 0.44 - 1.00 mg/dL  ? Calcium 9.4 8.9 - 10.3 mg/dL  ? GFR, Estimated >60 >60 mL/min  ? Anion gap 9 5 - 15  ?Glucose, capillary  ?Result Value Ref Range  ? Glucose-Capillary 113 (H) 70 - 99 mg/dL  ?Glucose, capillary  ?Result Value Ref Range  ? Glucose-Capillary 101 (H) 70 - 99 mg/dL  ?CBC  ?Result Value Ref Range  ? WBC 5.6 4.0 - 10.5 K/uL  ? RBC 3.04 (L) 3.87 -  5.11 MIL/uL  ? Hemoglobin 8.9 (L) 12.0 - 15.0 g/dL  ? HCT 29.2 (L) 36.0 - 46.0 %  ? MCV 96.1 80.0 - 100.0 fL  ? MCH 29.3 26.0 - 34.0 pg  ? MCHC 30.5 30.0 - 36.0 g/dL  ? RDW 16.4 (H) 11.5 - 15.5 %  ? Platelets 205 150 - 400 K/uL  ? nRBC 0.0 0.0 - 0.2 %  ?CBC  ?Result Value Ref Range  ? WBC 6.0 4.0 - 10.5 K/uL  ? RBC 2.92 (L) 3.87 - 5.11 MIL/uL  ? Hemoglobin 8.5 (L) 12.0 - 15.0 g/dL  ? HCT 27.3 (L) 36.0 - 46.0 %  ? MCV 93.5 80.0 - 100.0 fL  ? MCH 29.1 26.0 - 34.0 pg  ? MCHC 31.1 30.0 - 36.0 g/dL  ? RDW 16.4 (H) 11.5 - 15.5 %  ? Platelets 187 150 - 400 K/uL  ? nRBC 0.0 0.0 - 0.2 %  ?VITAMIN D 25 Hydroxy (Vit-D Deficiency, Fractures)  ?Result Value Ref Range  ? Vit D, 25-Hydroxy 80.14 30 - 100 ng/mL  ? ? ?Discharge Medications:   ?Allergies as of 12/29/2021   ? ?   Reactions  ? Bee Venom Shortness Of Breath, Swelling  ? Iohexol Shortness Of Breath, Other (See Comments)  ? CHEST TIGHTNESS and BREATHING PROBLEMS- NEEDS PRE-MEDS ?13 hours beforehand-  premeds given 06/08/2020 without incident   ? Tape Other  (See Comments)  ? Tears the skin!!  ? Betadine [povidone Iodine] Rash  ? Ciprofloxacin Rash  ? Latex Rash, Other (See Comments)  ? NO latex gloves!!  ? Penicillins Swelling  ? Did it involve swelling of the face/tongue/throat, SOB, or low BP? Yes ?Did it involve sudden or severe rash/hives, skin peeling, or any reaction on the inside of your mouth or nose? No ?Did you need to seek medical attention at a hospital or doctor's office? Yes ?When did it last happen?      1996 ?If all above answers are "NO", may proceed with cephalosporin use.  ? Povidone-iodine Rash  ? ?  ? ?  ?Medication List  ?  ? ?STOP taking these medications   ? ?aspirin-acetaminophen-caffeine 250-250-65 MG tablet ?Commonly known as: [place] ?  ?oxyCODONE-acetaminophen 5-325 MG tablet ?Commonly known as: PERCOCET/ROXICET ?  ? ?  ? ?TAKE these medications   ? ?albuterol 108 (90 Base) MCG/ACT inhaler ?Commonly known as: VENTOLIN HFA ?Inhale 2 puffs into the lungs every 4 (four) hours as needed for wheezing or shortness of breath. ?  ?albuterol (2.5 MG/3ML) 0.083% nebulizer solution ?Commonly known as: PROVENTIL ?Take 3 mLs (2.5 mg total) by nebulization every 6 (six) hours as needed for up to 30 doses for wheezing or shortness of breath. ?  ?aspirin 81 MG chewable tablet ?Chew 1 tablet (81 mg total) by mouth 2 (two) times daily. ?  ?BOIL-EASE EX ?Apply 1 application. topically daily as needed (Boil). ?  ?carbamide peroxide 6.5 % OTIC solution ?Commonly known as: DEBROX ?Place 5 drops into both ears daily as needed (ears). ?  ?colestipol 1 g tablet ?Commonly known as: COLESTID ?TAKE 3 TABLETS BY MOUTH ONCE DAILY. DO NOT TAKE WITHIN TWO HOURS OF OTHER MEDS. ?  ?diazepam 5 MG tablet ?Commonly known as: VALIUM ?Take 2.5 mg by mouth at bedtime. 1/2 tab qhs ?  ?diclofenac Sodium 1 % Gel ?Commonly known as: VOLTAREN ?Apply topically as needed. ?  ?docusate sodium 100 MG capsule ?Commonly known as: COLACE ?Take 1 capsule (100 mg total) by mouth  2  (two) times daily. ?  ?empagliflozin 25 MG Tabs tablet ?Commonly known as: JARDIANCE ?Take 25 mg by mouth daily. ?  ?furosemide 20 MG tablet ?Commonly known as: LASIX ?Take 20 mg by mouth daily. ?  ?guaiFENesin-dextromethorphan 100-10 MG/5ML syrup ?Commonly known as: ROBITUSSIN DM ?Take 5 mLs by mouth every 4 (four) hours as needed for cough. ?  ?hydrocortisone cream 1 % ?Apply 1 application topically daily as needed for itching. ?  ?Iron 325 (65 Fe) MG Tabs ?Take 1 tablet by mouth daily. ?  ?levothyroxine 112 MCG tablet ?Commonly known as: SYNTHROID ?  ?metFORMIN 500 MG tablet ?Commonly known as: GLUCOPHAGE ?  ?Multi Vitamin Tabs ?  ?neomycin-bacitracin-polymyxin Oint ?Commonly known as: NEOSPORIN ?Apply 1 application topically daily as needed for irritation or wound care. ?  ?Omega 3 1000 MG Caps ?  ?OXYGEN ?2L at home ?  ?pantoprazole 40 MG tablet ?Commonly known as: PROTONIX ?Take 40 mg by mouth daily. ?  ?PARoxetine 20 MG tablet ?Commonly known as: PAXIL ?Take 20 mg by mouth daily. ?  ?predniSONE 50 MG tablet ?Commonly known as: DELTASONE ?Take one tablet by mouth 13 hours, on tablet 7 hours and 1 tablet one hour prior to CT scan. Take benaddryl 50 mg onr hour prior to scan. ?  ?rosuvastatin 20 MG tablet ?Commonly known as: CRESTOR ?Take 20 mg by mouth daily as needed. ?  ?traZODone 150 MG tablet ?Commonly known as: DESYREL ?Take 75 mg by mouth at bedtime. ?  ?Trelegy Ellipta 100-62.5-25 MCG/ACT Aepb ?Generic drug: Fluticasone-Umeclidin-Vilant ?Inhale 1 puff into the lungs daily. ?  ?Vitamin D3 50 MCG (2000 UT) Tabs ?Take 8,000 Units by mouth daily. ?  ? ?  ? ? ?Diagnostic Studies: DG Knee 1-2 Views Right ? ?Result Date: 12/28/2021 ?CLINICAL DATA:  Fracture of patella. EXAM: RIGHT KNEE - 1-2 VIEW COMPARISON:  12/26/2021 and 12/20/2021 FINDINGS: Two views of the knee demonstrate 3 surgical fixation screws in the patella. Minimal residual displacement at the patella fracture. Evidence for fluid and gas in the  suprapatellar region and compatible with recent surgery. Right knee is located. IMPRESSION: Internal fixation of the patellar fracture. Minimal residual displacement of the fracture. Expected postoperative

## 2022-01-11 NOTE — Telephone Encounter (Signed)
IC LM advising.  ?

## 2022-01-11 NOTE — Telephone Encounter (Signed)
We can check xrays on knee when she comes in      should still be in knee immobilizer

## 2022-01-12 ENCOUNTER — Telehealth: Payer: Self-pay | Admitting: Internal Medicine

## 2022-01-12 DIAGNOSIS — F411 Generalized anxiety disorder: Secondary | ICD-10-CM | POA: Diagnosis not present

## 2022-01-12 DIAGNOSIS — F339 Major depressive disorder, recurrent, unspecified: Secondary | ICD-10-CM | POA: Diagnosis not present

## 2022-01-12 NOTE — Telephone Encounter (Signed)
Received outside labs done 3/22 hemoglobin 8.8/29.0 MCV 92 platelet count 269,000 iron binding capacity 310 serum iron 27 iron saturation 9% ferritin 49 ([facility]) see recent consultation note and updated lab results management recommendations under separate tab. ?

## 2022-01-17 ENCOUNTER — Ambulatory Visit (INDEPENDENT_AMBULATORY_CARE_PROVIDER_SITE_OTHER): Payer: Medicare HMO | Admitting: Orthopedic Surgery

## 2022-01-17 ENCOUNTER — Other Ambulatory Visit: Payer: Self-pay | Admitting: Pulmonary Disease

## 2022-01-17 ENCOUNTER — Ambulatory Visit (INDEPENDENT_AMBULATORY_CARE_PROVIDER_SITE_OTHER): Payer: Medicare HMO

## 2022-01-17 DIAGNOSIS — S82041A Displaced comminuted fracture of right patella, initial encounter for closed fracture: Secondary | ICD-10-CM | POA: Diagnosis not present

## 2022-01-19 ENCOUNTER — Encounter: Payer: Self-pay | Admitting: Orthopedic Surgery

## 2022-01-19 NOTE — Progress Notes (Signed)
? ?Post-Op Visit Note ?  ?Patient: [name]           ?Date of Birth: [birth year]           ?MRN: [id number] ?Visit Date: 01/17/2022 ?PCP: [name], MD ? ? ?Assessment & Plan: ? ?Chief Complaint:  ?Chief Complaint  ?Patient presents with  ? Right Knee - Routine Post Op  ?  12/26/21 right patella ORIF  ? ?Visit Diagnoses:  ?1. Closed displaced comminuted fracture of right patella, initial encounter   ? ? ?Plan: Lakesia is a 71 year old patient with right patella open reduction internal fixation performed 12/26/2021.  She has been ambulating full weightbearing with walker and knee immobilizer.  Having some pain.  She has had falls x2.  On examination the incision looks intact.  Has a little bit of erythema around the incision which disappears with knee elevation.  No fluctuance or drainage from any of these regions.  She is able to do a straight leg raise relatively easily.  Radiographs look good.  Plan is to continue to work with PT for balance and walking in the knee immobilizer.  I want her to continue to stay in the knee immobilizer.  Come back in 3 weeks with lateral radiograph and AP radiograph of the patella and likely discontinuation of the knee immobilizer at that time.  No calf tenderness negative [place] today. ? ?Follow-Up Instructions: Return in about 3 weeks (around 02/07/2022).  ? ?Orders:  ?Orders Placed This Encounter  ?Procedures  ? XR Knee 1-2 Views Right  ? ?No orders of the defined types were placed in this encounter. ? ? ?Imaging: ?No results found. ? ?PMFS History: ?Patient Active Problem List  ? Diagnosis Date Noted  ? Fracture of patella 12/26/2021  ? 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 Brain mass 02/28/2021  ? Acute on chronic diastolic CHF (congestive heart failure) ([place]) 02/28/2021  ? Brain metastases   ? Small cell lung cancer, right middle lobe ([place]) 05/30/2020  ? Encounter for antineoplastic chemotherapy 05/30/2020  ? Goals of care, counseling/discussion 05/30/2020  ?  Intolerance to cold 05/23/2020  ? Mediastinal adenopathy 05/20/2020  ? Abnormal weight loss 05/17/2020  ? IDA (iron deficiency anemia) 02/15/2020  ? Hepatic cirrhosis ([place]) 02/15/2020  ? CKD (chronic kidney disease), stage IIIa 01/09/2020  ? Lung mass 01/09/2020  ? Orthostatic hypotension 12/31/2019  ? Non-ST elevation (NSTEMI) myocardial infarction [facility])   ? Anemia   ? Dyspnea   ? Hx of heart artery stent   ? Benign hypertension with CKD (chronic kidney disease) stage III (HCC)   ? Former smoker   ? Chest pain 12/20/2019  ? Weakness 09/04/2014  ? Dizziness 09/04/2014  ? Hepatomegaly 03/31/2014  ? Esophageal dysphagia 03/31/2014  ? Unspecified constipation 03/31/2014  ? AKI (acute kidney injury) ([place]) 10/30/2013  ? Altered mental status 09/28/2013  ? ETOH abuse 09/28/2013  ? Altered mental state 09/28/2013  ? Hyperkalemia 06/05/2013  ? Bradycardia 06/05/2013  ? Edema 06/02/2013  ? Hyponatremia 06/02/2013  ? Pneumonia due to infectious agent 12/26/2012  ? Insomnia due to mental disorder 09/02/2012  ? RECTAL PAIN 09/04/2010  ? CHEST PAIN 08/22/2010  ? Insulin dependent type 2 diabetes mellitus ([place]) 06/16/2010  ? Coronary artery disease/Prior Stents 1997 and 2000/RCA occlusion 12/2019 06/16/2010  ? MICROALBUMINURIA 06/01/2009  ? ACTINIC KERATOSIS, HEAD 04/20/2009  ? OTHER DYSPHAGIA 01/05/2009  ? COLONIC POLYPS, ADENOMATOUS, HX OF 01/05/2009  ? SCHATZKI'S RING, HX OF 01/05/2009  ? BACK  PAIN 08/04/2008  ? DIARRHEA, CHRONIC 08/04/2008  ? TOBACCO ABUSE 06/03/2007  ? IBS 06/03/2007  ? NEPHROLITHIASIS 02/04/2007  ? ANXIETY STATE NOS 11/26/2006  ? Hypothyroidism 11/01/2006  ? Mixed hyperlipidemia 11/01/2006  ? Depression 11/01/2006  ? PERIPHERAL NEUROPATHY 11/01/2006  ? CATARACT NOS 11/01/2006  ? ALLERGIC RHINITIS 11/01/2006  ? COPD (chronic obstructive pulmonary disease) ([place]) 11/01/2006  ? GERD 11/01/2006  ? DEGENERATION, DISC NOS 11/01/2006  ? LOW BACK PAIN 11/01/2006  ? DVT, HX OF 11/01/2006  ? ?Past Medical History:   ?Diagnosis Date  ? Acute on chronic diastolic CHF (congestive heart failure) ([place]) 02/28/2021  ? Allergic rhinitis   ? Amputation of hand, right ([place]) 1984  ? Anemia   ? Anxiety   ? ASCVD (arteriosclerotic cardiovascular disease)   ? MI in 96 requiring BMS CX; DES to M1 in 2000;normal coronary angiography in 2004  ? Cholelithiasis   ? COPD (chronic obstructive pulmonary disease) ([place])   ? DDD (degenerative disc disease), lumbar   ? Depression   ? Diabetes mellitus   ? Type II  ? Diarrhea   ? DVT (deep venous thrombosis) ([place])   ? patient said no  ? GERD (gastroesophageal reflux disease)   ? Headache   ? migraine  ? History of kidney stones   ? 2006  ? History of radiation therapy 06/28/2020-08/08/2020  ? right Lung; Dr. [name]  ? History of radiation therapy 03/22/2021  ? brain 03/02/2021-03/22/2021   Dr [name]  ? Hyperlipidemia   ? Hypertension   ? Hypotension   ? Hypothyroidism   ? IBS (irritable bowel syndrome)   ? Low back pain   ? Myocardial infarction [facility]) 01/1995  ? Nephrolithiasis 2006  ? stone extraction   ? Panic attacks   ? Peripheral neuropathy   ? Peripheral vascular disease ([place])   ? legs  ? Pneumonia 12/22/2019  ? Sciatic pain   ? right  ? Small cell lung cancer ([place])   ? Tobacco abuse   ? Tremor   ? Weakness of extremity   ? left hand weakness  ?  ?Family History  ?Problem Relation Age of Onset  ? Depression Mother   ? Bipolar disorder Mother   ? Dementia Mother   ? Pulmonary fibrosis Mother   ? Alcohol abuse Father   ? Aneurysm Father   ?     deceased age 60, brain  ? Colon cancer Paternal Grandfather   ?     age greater than 40  ? Healthy Child   ?  ?Past Surgical History:  ?Procedure Laterality Date  ? AGILE CAPSULE N/A 03/07/2020  ? Procedure: AGILE CAPSULE;  Surgeon: Daneil Dolin, MD;  [facility];  Service: Endoscopy;  Laterality: N/A;  7:30am  ? BACK SURGERY    ? fusion  ? BRONCHIAL NEEDLE ASPIRATION BIOPSY N/A 05/20/2020  ? Procedure: BRONCHIAL NEEDLE ASPIRATION  BIOPSIES;  Surgeon: Collene Gobble, MD;  Location: [facility] ENDOSCOPY;  Service: Pulmonary;  Laterality: N/A;  ? CHOLECYSTECTOMY    ? COLONOSCOPY  01/2009  ? NFA:OZHYQM rectum/repeat in 5 yrs  ? COLONOSCOPY N/A 04/29/2014  ? Dr.Rourk- attempted/incomplete colonoscopy. inadequate prep  ? COLONOSCOPY N/A 05/27/2014  ? VHQ:IONGEXBMW coli. Colonic polyps-removed as described above.Status post segmental biopsy. single tubular adenoma and random colon bx neg  ? COLONOSCOPY WITH PROPOFOL N/A 12/23/2019  ? Procedure: COLONOSCOPY WITH PROPOFOL;  Surgeon: Ronnette Juniper, MD;  Location: [place];  Service: Gastroenterology;  Laterality: N/A;  ?  [place] OF UTERUS  1974  ? ESOPHAGOGASTRODUODENOSCOPY  05/2010  ? Dr. Tessie Fass, erosion. 64F dilation  ? ESOPHAGOGASTRODUODENOSCOPY N/A 04/29/2014  ? Dr.Rourk- normal esophagus s/p passage of maloney dilator. small hiatal hernia- bx= chronic inflammation.  ? ESOPHAGOGASTRODUODENOSCOPY (EGD) WITH PROPOFOL N/A 12/23/2019  ? Procedure: ESOPHAGOGASTRODUODENOSCOPY (EGD) WITH PROPOFOL;  Surgeon: Ronnette Juniper, MD;  Location: [place];  Service: Gastroenterology;  Laterality: N/A;  ? GIVENS CAPSULE STUDY N/A 03/22/2020  ? Procedure: GIVENS CAPSULE STUDY;  Surgeon: Daneil Dolin, MD;  [facility];  Service: Endoscopy;  Laterality: N/A;  7:30am  ? HEMOSTASIS CLIP PLACEMENT  12/23/2019  ? Procedure: HEMOSTASIS CLIP PLACEMENT;  Surgeon: Ronnette Juniper, MD;  Location: [place];  Service: Gastroenterology;;  ? HOT HEMOSTASIS N/A 12/23/2019  ? Procedure: HOT HEMOSTASIS (ARGON PLASMA COAGULATION/BICAP);  Surgeon: Ronnette Juniper, MD;  Location: [place];  Service: Gastroenterology;  Laterality: N/A;  ? KNEE SURGERY Left   ? LEFT HEART CATH AND CORONARY ANGIOGRAPHY N/A 12/24/2019  ? Procedure: LEFT HEART CATH AND CORONARY ANGIOGRAPHY;  Surgeon: Nigel Mormon, MD;  Location: [place] [facility];  Service: Cardiovascular;  Laterality: N/A;  ? MALONEY DILATION N/A  04/29/2014  ? Procedure: MALONEY DILATION;  Surgeon: Daneil Dolin, MD;  [facility];  Service: Endoscopy;  Laterality: N/A;  ? ORIF PATELLA Right 12/26/2021  ? Procedure: RIGHT OPEN REDUCTION INTERNAL (O

## 2022-01-23 ENCOUNTER — Other Ambulatory Visit: Payer: Self-pay | Admitting: Gastroenterology

## 2022-01-23 NOTE — Telephone Encounter (Signed)
Attempted to call pt but unable to reach. Left message for her to return call. Due to multiple attempts trying to reach pt without being able to do so, per protocol encounter will be closed. ?

## 2022-01-23 NOTE — Telephone Encounter (Signed)
Last ov 01/09/22 ?

## 2022-01-24 ENCOUNTER — Other Ambulatory Visit: Payer: Self-pay

## 2022-01-24 DIAGNOSIS — W540XXA Bitten by dog, initial encounter: Secondary | ICD-10-CM | POA: Diagnosis not present

## 2022-01-24 DIAGNOSIS — M79642 Pain in left hand: Secondary | ICD-10-CM | POA: Diagnosis not present

## 2022-01-24 MED ORDER — COLESTIPOL HCL 1 G PO TABS: ORAL_TABLET | ORAL | 3 refills | Status: DC

## 2022-01-24 NOTE — Telephone Encounter (Signed)
Refills sent in

## 2022-01-29 DIAGNOSIS — J849 Interstitial pulmonary disease, unspecified: Secondary | ICD-10-CM | POA: Diagnosis not present

## 2022-01-29 DIAGNOSIS — J9611 Chronic respiratory failure with hypoxia: Secondary | ICD-10-CM | POA: Diagnosis not present

## 2022-02-06 DIAGNOSIS — F411 Generalized anxiety disorder: Secondary | ICD-10-CM | POA: Diagnosis not present

## 2022-02-06 DIAGNOSIS — F329 Major depressive disorder, single episode, unspecified: Secondary | ICD-10-CM | POA: Diagnosis not present

## 2022-02-14 ENCOUNTER — Ambulatory Visit (INDEPENDENT_AMBULATORY_CARE_PROVIDER_SITE_OTHER): Payer: Medicare HMO

## 2022-02-14 ENCOUNTER — Ambulatory Visit (INDEPENDENT_AMBULATORY_CARE_PROVIDER_SITE_OTHER): Payer: Medicare HMO | Admitting: Surgical

## 2022-02-14 ENCOUNTER — Encounter: Payer: Self-pay | Admitting: Orthopedic Surgery

## 2022-02-14 DIAGNOSIS — S82041A Displaced comminuted fracture of right patella, initial encounter for closed fracture: Secondary | ICD-10-CM

## 2022-02-18 ENCOUNTER — Encounter: Payer: Self-pay | Admitting: Orthopedic Surgery

## 2022-02-18 NOTE — Progress Notes (Signed)
? ?Post-Op Visit Note ?  ?Patient: [name]           ?Date of Birth: Sep 07, 1951           ?MRN: [id number] ?Visit Date: 02/14/2022 ?PCP: [name], MD ? ? ?Assessment & Plan: ? ?Chief Complaint:  ?Chief Complaint  ?Patient presents with  ? Right Knee - Fracture  ? ?Visit Diagnoses:  ?1. Closed displaced comminuted fracture of right patella, initial encounter   ? ? ?Plan: Patient is a 71 year old female who presents s/p right patella fracture ORIF on 12/26/2021.  She states that "everything is good".  She denies much in the way of pain or swelling. ?Physical therapy once per week and has about 3 more weeks of therapy.  Primarily working on balance to help with preventing falls.  She had 1 fall about 1.5 weeks ago but no increase in her pain since then.  She feels her passive and active range of motion of the right knee are improving.  No fevers, chills, night sweats, drainage from the incision, chest pain, shortness of breath that is new for her.  No calf pain. ? ?On exam, incision is very well-healed without evidence of infection or dehiscence.  No effusion noted.  No calf tenderness.  Negative Homans' sign.  There is no tenderness over the fracture site.  Patient is able to perform straight leg raise.  5 -/5 quadricep strength.  She has range of motion from 0 degrees to about 110 degrees.  Intact ankle dorsiflexion, plantarflexion.  1+ DP pulse of the operative extremity. ? ?Plan is continue with physical therapy and transition to home exercise program.  Radiographs of the right knee demonstrate continual good alignment of the articular surface of the patella with no significant change since prior radiographs.  Fracture line is still identifiable on radiographs but with her clinically improving with no tenderness over the fracture site, impression is healing patella fracture.  Ordered DEXA scan for further evaluation of osteoporosis.  Follow-up in 6 weeks for clinical recheck with Dr. Marlou Sa and likely  release at that time. ? ?Follow-Up Instructions: No follow-ups on file.  ? ?Orders:  ?Orders Placed This Encounter  ?Procedures  ? XR Knee 1-2 Views Right  ? DG BONE DENSITY (DXA)  ? ?No orders of the defined types were placed in this encounter. ? ? ?Imaging: ?No results found. ? ?PMFS History: ?Patient Active Problem List  ? Diagnosis Date Noted  ? Fracture of patella 12/26/2021  ? 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 Brain mass 02/28/2021  ? Acute on chronic diastolic CHF (congestive heart failure) ([place]) 02/28/2021  ? Brain metastases   ? Small cell lung cancer, right middle lobe ([place]) 05/30/2020  ? Encounter for antineoplastic chemotherapy 05/30/2020  ? Goals of care, counseling/discussion 05/30/2020  ? Intolerance to cold 05/23/2020  ? Mediastinal adenopathy 05/20/2020  ? Abnormal weight loss 05/17/2020  ? IDA (iron deficiency anemia) 02/15/2020  ? Hepatic cirrhosis ([place]) 02/15/2020  ? CKD (chronic kidney disease), stage IIIa 01/09/2020  ? Lung mass 01/09/2020  ? Orthostatic hypotension 12/31/2019  ? Non-ST elevation (NSTEMI) myocardial infarction [facility])   ? Anemia   ? Dyspnea   ? Hx of heart artery stent   ? Benign hypertension with CKD (chronic kidney disease) stage III (HCC)   ? Former smoker   ? Chest pain 12/20/2019  ? Weakness 09/04/2014  ? Dizziness 09/04/2014  ? Hepatomegaly 03/31/2014  ? Esophageal dysphagia 03/31/2014  ? Unspecified constipation  03/31/2014  ? AKI (acute kidney injury) ([place]) 10/30/2013  ? Altered mental status 09/28/2013  ? ETOH abuse 09/28/2013  ? Altered mental state 09/28/2013  ? Hyperkalemia 06/05/2013  ? Bradycardia 06/05/2013  ? Edema 06/02/2013  ? Hyponatremia 06/02/2013  ? Pneumonia due to infectious agent 12/26/2012  ? Insomnia due to mental disorder 09/02/2012  ? RECTAL PAIN 09/04/2010  ? CHEST PAIN 08/22/2010  ? Insulin dependent type 2 diabetes mellitus ([place]) 06/16/2010  ? Coronary artery disease/Prior Stents 1997 and 2000/RCA occlusion 12/2019 06/16/2010  ?  MICROALBUMINURIA 06/01/2009  ? ACTINIC KERATOSIS, HEAD 04/20/2009  ? OTHER DYSPHAGIA 01/05/2009  ? COLONIC POLYPS, ADENOMATOUS, HX OF 01/05/2009  ? SCHATZKI'S RING, HX OF 01/05/2009  ? BACK PAIN 08/04/2008  ? DIARRHEA, CHRONIC 08/04/2008  ? TOBACCO ABUSE 06/03/2007  ? IBS 06/03/2007  ? NEPHROLITHIASIS 02/04/2007  ? ANXIETY STATE NOS 11/26/2006  ? Hypothyroidism 11/01/2006  ? Mixed hyperlipidemia 11/01/2006  ? Depression 11/01/2006  ? PERIPHERAL NEUROPATHY 11/01/2006  ? CATARACT NOS 11/01/2006  ? ALLERGIC RHINITIS 11/01/2006  ? COPD (chronic obstructive pulmonary disease) ([place]) 11/01/2006  ? GERD 11/01/2006  ? DEGENERATION, DISC NOS 11/01/2006  ? LOW BACK PAIN 11/01/2006  ? DVT, HX OF 11/01/2006  ? ?Past Medical History:  ?Diagnosis Date  ? Acute on chronic diastolic CHF (congestive heart failure) ([place]) 02/28/2021  ? Allergic rhinitis   ? Amputation of hand, right ([place]) 1984  ? Anemia   ? Anxiety   ? ASCVD (arteriosclerotic cardiovascular disease)   ? MI in 96 requiring BMS CX; DES to M1 in 2000;normal coronary angiography in 2004  ? Cholelithiasis   ? COPD (chronic obstructive pulmonary disease) ([place])   ? DDD (degenerative disc disease), lumbar   ? Depression   ? Diabetes mellitus   ? Type II  ? Diarrhea   ? DVT (deep venous thrombosis) ([place])   ? patient said no  ? GERD (gastroesophageal reflux disease)   ? Headache   ? migraine  ? History of kidney stones   ? 2006  ? History of radiation therapy 06/28/2020-08/08/2020  ? right Lung; Dr. [name]  ? History of radiation therapy 03/22/2021  ? brain 03/02/2021-03/22/2021   Dr [name]  ? Hyperlipidemia   ? Hypertension   ? Hypotension   ? Hypothyroidism   ? IBS (irritable bowel syndrome)   ? Low back pain   ? Myocardial infarction [facility]) 01/1995  ? Nephrolithiasis 2006  ? stone extraction   ? Panic attacks   ? Peripheral neuropathy   ? Peripheral vascular disease ([place])   ? legs  ? Pneumonia 12/22/2019  ? Sciatic pain   ? right  ? Small cell lung cancer ([place])   ?  Tobacco abuse   ? Tremor   ? Weakness of extremity   ? left hand weakness  ?  ?Family History  ?Problem Relation Age of Onset  ? Depression Mother   ? Bipolar disorder Mother   ? Dementia Mother   ? Pulmonary fibrosis Mother   ? Alcohol abuse Father   ? Aneurysm Father   ?     deceased age 47, brain  ? Colon cancer Paternal Grandfather   ?     age greater than 72  ? Healthy Child   ?  ?Past Surgical History:  ?Procedure Laterality Date  ? AGILE CAPSULE N/A 03/07/2020  ? Procedure: AGILE CAPSULE;  Surgeon: Daneil Dolin, MD;  [facility];  Service: Endoscopy;  Laterality: N/A;  7:30am  ?  BACK SURGERY    ? fusion  ? BRONCHIAL NEEDLE ASPIRATION BIOPSY N/A 05/20/2020  ? Procedure: BRONCHIAL NEEDLE ASPIRATION BIOPSIES;  Surgeon: Collene Gobble, MD;  Location: [facility] ENDOSCOPY;  Service: Pulmonary;  Laterality: N/A;  ? CHOLECYSTECTOMY    ? COLONOSCOPY  01/2009  ? UNG:BMBOMQ rectum/repeat in 5 yrs  ? COLONOSCOPY N/A 04/29/2014  ? Dr.Rourk- attempted/incomplete colonoscopy. inadequate prep  ? COLONOSCOPY N/A 05/27/2014  ? TTC:[id number] coli. Colonic polyps-removed as described above.Status post segmental biopsy. single tubular adenoma and random colon bx neg  ? COLONOSCOPY WITH PROPOFOL N/A 12/23/2019  ? Procedure: COLONOSCOPY WITH PROPOFOL;  Surgeon: Ronnette Juniper, MD;  Location: [place];  Service: Gastroenterology;  Laterality: N/A;  ? [place] OF UTERUS  1974  ? ESOPHAGOGASTRODUODENOSCOPY  05/2010  ? Dr. Tessie Fass, erosion. 70F dilation  ? ESOPHAGOGASTRODUODENOSCOPY N/A 04/29/2014  ? Dr.Rourk- normal esophagus s/p passage of maloney dilator. small hiatal hernia- bx= chronic inflammation.  ? ESOPHAGOGASTRODUODENOSCOPY (EGD) WITH PROPOFOL N/A 12/23/2019  ? Procedure: ESOPHAGOGASTRODUODENOSCOPY (EGD) WITH PROPOFOL;  Surgeon: Ronnette Juniper, MD;  Location: [place];  Service: Gastroenterology;  Laterality: N/A;  ? GIVENS CAPSULE STUDY N/A 03/22/2020  ? Procedure: GIVENS CAPSULE STUDY;  Surgeon:  Daneil Dolin, MD;  [facility];  Service: Endoscopy;  Laterality: N/A;  7:30am  ? HEMOSTASIS CLIP PLACEMENT  12/23/2019  ? Procedure: HEMOSTASIS CLIP PLACEMENT;  Surgeon: Ronnette Juniper, MD;  Location: [facility]

## 2022-02-19 NOTE — Progress Notes (Signed)
? ? ?Referring Provider: [name], MD ?Primary Care Physician:  [name], MD ?Primary GI Physician: Dr. Gala Romney ? ?No chief complaint on file. ? ? ?HPI:   ?[name] is a 71 y.o. 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 ? ?Last seen in our office 01/09/2022 by Dr. Gala Romney for evaluation of notable drop in H&H recently.  In February, her hemoglobin declined to 9.8 from 11.  Hemoglobin declined further to 8.8 in March.  Ferritin 49, iron saturation 9%, iron 27.  Denied overt GI bleeding.  Chronic diarrhea controlled on Colestid.  Denied NSAIDs.  No significant GI symptoms.  Increase shortness of breath recently.  Has oxygen concentrator at home.  Overall, suspected likely occult GI bleeding of obscure etiology although AVMs in her gut are the likely culprit.  She was Hemoccult negative in the office.  Discussion was had regarding AVMs that they are not curable.  Stated patient was not a candidate for further endoscopic evaluation given her comorbidities.  Stated patient may benefit from periodic blood transfusions and continuing iron therapy.  Given symptomatology and comorbidities, stated transfusion threshold around 9 may be most appropriate.  Plan to check H&H. ? ?Her hemoglobin was stable at 8.7.  Recommended continuing iron and PPI, repeat CBC in 3 weeks.  This has not been completed. ? ?Today: ?Presents today with caregiver.  Also with service dog. ? ?Anemia:  ?No brbpr or melena. Feeling much better overall. Hasn't fallen in 2.5 weeks. Was falling daily when she saw Dr. Gala Romney at her last visit. Mentation much better as well. Care giver states she is about 100%. Breathing has improved though she states she never had this sensation of shortness of breath.  States it was more  of an issue with coughing.  Using inhaler in the morning if having a little cough and this has helped quite a bit. On 2L  chronically. Still taking iron daily.  No NSAIDs aside from 81 mg aspirin.  ? ?Caregiver reports patient has history of B12 and folate deficiency and is asking to have these levels checked today with her routine labs.  She is taking B complex vitamin and multivitamin. ? ?GERD:  ?Well controlled on Protonix daily. No dysphagia. No nausea or vomiting.  ?  ?Diarrhea:  ?Well controlled on colestipol. Taking 2 daily.  ?Started taking a couple stool softeners a day when she started pain medications after fracturing her knee.  She is now taking 1 stool softener daily.  Bowels are moving daily. ? ?Cirrhosis: ?Denies abdominal distention, lower extremity edema, yellowing of the eyes or skin, mental status changes. ?CT in January without focal liver lesion.  ?Not interested in having an EGD. ? ? ?Previous work-up: ?Hospitalized in March 2021 with pneumonia.  Also had anemia with hemoglobin in the 7-8 range, hematochezia for several months. EGD/TCS completed during admission showing mild gastritis (no specimen collected), single polyp removed from colon (path-lipoma), single non-bleeding colonic angioectasia. Treated with argon plasma coagulation (APC). Friable (with contact bleeding) mucosa in the transverse colon. Clip (MR conditional) was placed.  ? ?Capsule endoscopy 03/22/20: few gastric erosions and small AVM. Few scattered small bowel erosions. Couple of small bowel AVMs.  ? ?Past Medical History:  ?Diagnosis Date  ? Acute on chronic diastolic  CHF (congestive heart failure) ([place]) 02/28/2021  ? Allergic rhinitis   ? Amputation of hand, right ([place]) 1984  ? Anemia   ? Anxiety   ? ASCVD (arteriosclerotic cardiovascular disease)   ? MI in 96 requiring BMS CX; DES to M1 in 2000;normal coronary angiography in 2004  ? Cholelithiasis   ? COPD (chronic obstructive pulmonary disease) ([place])   ? DDD (degenerative  disc disease), lumbar   ? Depression   ? Diabetes mellitus   ? Type II  ? Diarrhea   ? DVT (deep venous thrombosis) ([place])   ? patient said no  ? GERD (gastroesophageal reflux disease)   ? Headache   ? migraine  ? History of kidney stones   ? 2006  ? History of radiation therapy 06/28/2020-08/08/2020  ? right Lung; Dr. [name]  ? History of radiation therapy 03/22/2021  ? brain 03/02/2021-03/22/2021   Dr [name]  ? Hyperlipidemia   ? Hypertension   ? Hypotension   ? Hypothyroidism   ? IBS (irritable bowel syndrome)   ? Low back pain   ? Myocardial infarction [facility]) 01/1995  ? Nephrolithiasis 2006  ? stone extraction   ? Panic attacks   ? Peripheral neuropathy   ? Peripheral vascular disease ([place])   ? legs  ? Pneumonia 12/22/2019  ? Sciatic pain   ? right  ? Small cell lung cancer ([place])   ? Tobacco abuse   ? Tremor   ? Weakness of extremity   ? left hand weakness  ? ? ?Past Surgical History:  ?Procedure Laterality Date  ? AGILE CAPSULE N/A 03/07/2020  ? Procedure: AGILE CAPSULE;  Surgeon: Daneil Dolin, MD;  [facility];  Service: Endoscopy;  Laterality: N/A;  7:30am  ? BACK SURGERY    ? fusion  ? BRONCHIAL NEEDLE ASPIRATION BIOPSY N/A 05/20/2020  ? Procedure: BRONCHIAL NEEDLE ASPIRATION BIOPSIES;  Surgeon: Collene Gobble, MD;  [facility]OSCOPY;  Service: Pulmonary;  Laterality: N/A;  ? CHOLECYSTECTOMY    ? COLONOSCOPY  01/2009  ? [id number] rectum/repeat in 5 yrs  ? COLONOSCOPY N/A 04/29/2014  ? Dr.Rourk- attempted/incomplete colonoscopy. inadequate prep  ? COLONOSCOPY N/A 05/27/2014  ? NFA:[id number] coli. Colonic polyps-removed as described above.Status post segmental biopsy. single tubular adenoma and random colon bx neg  ? COLONOSCOPY WITH PROPOFOL N/A 12/23/2019  ? Surgeon: Ronnette Juniper, MD; single polyp removed from colon (path-lipoma), single non-bleeding colonic angioectasia. Treated with argon plasma coagulation (APC). Friable (with contact bleeding) mucosa in the transverse colon.  Clip (MR conditional) was placed.  ? [place] OF UTERUS  1974  ? ESOPHAGOGASTRODUODENOSCOPY  05/2010  ? Dr. Tessie Fass, erosion. 9F dilation  ? ESOPHAGOGASTRODUODENOSCOPY N/A 04/29/2014  ? Dr.Rourk- normal esophagus s/p passage of maloney dilator. small hiatal hernia- bx= chronic inflammation.  ? ESOPHAGOGASTRODUODENOSCOPY (EGD) WITH PROPOFOL N/A 12/23/2019  ? Surgeon: Ronnette Juniper, MD;  mild gastritis (no specimen collected)  ? GIVENS CAPSULE STUDY N/A 03/22/2020  ? Surgeon: Daneil Dolin, MD; few gastric erosions and small AVM. Few scattered small bowel erosions. Couple of small bowel AVMs.  ? HEMOSTASIS CLIP PLACEMENT  12/23/2019  ? Procedure: HEMOSTASIS CLIP PLACEMENT;  Surgeon: Ronnette Juniper, MD;  Location: [place];  Service: Gastroenterology;;  ? HOT HEMOSTASIS N/A 12/23/2019  ? Procedure: HOT HEMOSTASIS (ARGON PLASMA COAGULATION/BICAP);  Surgeon: Ronnette Juniper, MD;  Location: [place];  Service: Gastroenterology;  Laterality: N/A;  ? KNEE SURGERY Left   ? LEFT HEART CATH AND CORONARY ANGIOGRAPHY N/A  12/24/2019  ? Procedure: LEFT HEART CATH AND CORONARY ANGIOGRAPHY;  Surgeon: Nigel Mormon, MD;  Location: [place] CV LAB;  Service: Cardiovascular;  Laterality: N/A;  ? MALONEY DILATION N/A 04/29/2014  ? Procedure: MALONEY DILATION;  Surgeon: Daneil Dolin, MD;  [facility];  Service: Endoscopy;  Laterality: N/A;  ? ORIF PATELLA Right 12/26/2021  ? Procedure: RIGHT OPEN REDUCTION INTERNAL (ORIF) FIXATION PATELLA;  Surgeon: Meredith Pel, MD;  Location: [place];  Service: Orthopedics;  Laterality: Right;  ? PARTIAL HYSTERECTOMY  1978  ? POLYPECTOMY  12/23/2019  ? Procedure: POLYPECTOMY;  Surgeon: Ronnette Juniper, MD;  Location: [place];  Service: Gastroenterology;;  ? SHOULDER SURGERY Left   ? Left shoulder for RTC;left arm surgery '98/left hand surgery 2001  ? TOTAL ABDOMINAL HYSTERECTOMY W/ BILATERAL SALPINGOOPHORECTOMY  2002  ? UMBILICAL HERNIA REPAIR  2008  ?  VIDEO BRONCHOSCOPY WITH ENDOBRONCHIAL ULTRASOUND N/A 05/20/2020  ? Procedure: VIDEO BRONCHOSCOPY WITH ENDOBRONCHIAL ULTRASOUND;  Surgeon: Collene Gobble, MD;  Location: [facility] ENDOSCOPY;  Service: Pulmonary;

## 2022-02-21 ENCOUNTER — Encounter: Payer: Self-pay | Admitting: Gastroenterology

## 2022-02-21 ENCOUNTER — Ambulatory Visit (INDEPENDENT_AMBULATORY_CARE_PROVIDER_SITE_OTHER): Payer: Medicare HMO | Admitting: Gastroenterology

## 2022-02-21 ENCOUNTER — Other Ambulatory Visit: Payer: Self-pay | Admitting: Gastroenterology

## 2022-02-21 VITALS — BP 136/67 | HR 90 | Temp 97.5°F | Ht 66.0 in | Wt 125.0 lb

## 2022-02-21 DIAGNOSIS — K219 Gastro-esophageal reflux disease without esophagitis: Secondary | ICD-10-CM | POA: Diagnosis not present

## 2022-02-21 DIAGNOSIS — D509 Iron deficiency anemia, unspecified: Secondary | ICD-10-CM

## 2022-02-21 DIAGNOSIS — R197 Diarrhea, unspecified: Secondary | ICD-10-CM | POA: Diagnosis not present

## 2022-02-21 DIAGNOSIS — K7460 Unspecified cirrhosis of liver: Secondary | ICD-10-CM | POA: Diagnosis not present

## 2022-02-21 NOTE — Patient Instructions (Signed)
Please have blood work completed at [facility]. ? ?We will arrange for you to have an ultrasound of your liver in July. ? ?Continue Protonix 40 mg daily 30 minutes before breakfast. ? ?Continue Colestid up to 3 tablets daily.  As we discussed, you can decrease your dose of Colestid if you are experiencing constipation. ? ?Continue ferrous sulfate 325 mg daily. ? ?Avoid all NSAID products including ibuprofen, Aleve, Advil, BC powders, Goody powders, and anything that says "NSAID" on the package. ? ?Monitor for rectal bleeding or black stools and let us know if this occurs. ? ?It was great meeting you today! ? ?We will determine the timing of your follow-up once I have reviewed your lab results. ? ?Aliene Altes, PA-C ?[facility] ? ?

## 2022-02-22 ENCOUNTER — Encounter: Payer: Self-pay | Admitting: Internal Medicine

## 2022-02-22 LAB — COMPREHENSIVE METABOLIC PANEL
ALT: 8 IU/L (ref 0–32)
AST: 16 IU/L (ref 0–40)
Albumin/Globulin Ratio: 1.3 (ref 1.2–2.2)
Albumin: 3.8 g/dL (ref 3.8–4.8)
Alkaline Phosphatase: 64 IU/L (ref 44–121)
BUN/Creatinine Ratio: 11 — ABNORMAL LOW (ref 12–28)
BUN: 9 mg/dL (ref 8–27)
Bilirubin Total: <0.2 mg/dL (ref 0.0–1.2)
CO2: 23 mmol/L (ref 20–29)
Calcium: 9.3 mg/dL (ref 8.7–10.3)
Chloride: 99 mmol/L (ref 96–106)
Creatinine, Ser: 0.84 mg/dL (ref 0.57–1.00)
Globulin, Total: 3 g/dL (ref 1.5–4.5)
Glucose: 94 mg/dL (ref 70–99)
Potassium: 4.2 mmol/L (ref 3.5–5.2)
Sodium: 137 mmol/L (ref 134–144)
Total Protein: 6.8 g/dL (ref 6.0–8.5)
eGFR: 75 mL/min/{1.73_m2} (ref >59)

## 2022-02-22 LAB — PROTIME-INR
INR: 1 (ref 0.9–1.2)
Prothrombin Time: 11 s (ref 9.1–12.0)

## 2022-02-22 LAB — CBC WITH DIFFERENTIAL/PLATELET
Basophils Absolute: 0 10*3/uL (ref 0.0–0.2)
Basos: 1 %
EOS (ABSOLUTE): 0.1 10*3/uL (ref 0.0–0.4)
Eos: 2 %
Hematocrit: 32.2 % — ABNORMAL LOW (ref 34.0–46.6)
Hemoglobin: 9.8 g/dL — ABNORMAL LOW (ref 11.1–15.9)
Immature Grans (Abs): 0 10*3/uL (ref 0.0–0.1)
Immature Granulocytes: 0 %
Lymphocytes Absolute: 0.9 10*3/uL (ref 0.7–3.1)
Lymphs: 20 %
MCH: 27.9 pg (ref 26.6–33.0)
MCHC: 30.4 g/dL — ABNORMAL LOW (ref 31.5–35.7)
MCV: 92 fL (ref 79–97)
Monocytes Absolute: 0.3 10*3/uL (ref 0.1–0.9)
Monocytes: 7 %
Neutrophils Absolute: 3 10*3/uL (ref 1.4–7.0)
Neutrophils: 70 %
Platelets: 226 10*3/uL (ref 150–450)
RBC: 3.51 x10E6/uL — ABNORMAL LOW (ref 3.77–5.28)
RDW: 16 % — ABNORMAL HIGH (ref 11.7–15.4)
WBC: 4.3 10*3/uL (ref 3.4–10.8)

## 2022-02-22 LAB — IRON,TIBC AND FERRITIN PANEL
Ferritin: 51 ng/mL (ref 15–150)
Iron Saturation: 15 % (ref 15–55)
Iron: 50 ug/dL (ref 27–139)
Total Iron Binding Capacity: 336 ug/dL (ref 250–450)
UIBC: 286 ug/dL (ref 118–369)

## 2022-02-22 LAB — B12 AND FOLATE PANEL
Folate: >20 ng/mL (ref >3.0)
Vitamin B-12: 634 pg/mL (ref 232–1245)

## 2022-02-22 LAB — SPECIMEN STATUS REPORT

## 2022-02-23 ENCOUNTER — Encounter: Payer: Self-pay | Admitting: Internal Medicine

## 2022-02-26 ENCOUNTER — Other Ambulatory Visit: Payer: Self-pay | Admitting: Surgical

## 2022-02-26 NOTE — Telephone Encounter (Signed)
Doesn't need this anymore, if she wants to stay on, recommend discuss with PCP

## 2022-02-27 ENCOUNTER — Encounter: Payer: Self-pay | Admitting: Nutrition

## 2022-02-27 ENCOUNTER — Encounter: Payer: Medicare HMO | Attending: Internal Medicine | Admitting: Nutrition

## 2022-02-27 VITALS — Ht 65.5 in | Wt 125.8 lb

## 2022-02-27 DIAGNOSIS — C3490 Malignant neoplasm of unspecified part of unspecified bronchus or lung: Secondary | ICD-10-CM | POA: Diagnosis not present

## 2022-02-27 DIAGNOSIS — R634 Abnormal weight loss: Secondary | ICD-10-CM | POA: Diagnosis not present

## 2022-02-27 DIAGNOSIS — D509 Iron deficiency anemia, unspecified: Secondary | ICD-10-CM | POA: Diagnosis not present

## 2022-02-27 DIAGNOSIS — E440 Moderate protein-calorie malnutrition: Secondary | ICD-10-CM | POA: Diagnosis not present

## 2022-02-27 DIAGNOSIS — N183 Chronic kidney disease, stage 3 unspecified: Secondary | ICD-10-CM | POA: Insufficient documentation

## 2022-02-27 DIAGNOSIS — E118 Type 2 diabetes mellitus with unspecified complications: Secondary | ICD-10-CM | POA: Insufficient documentation

## 2022-02-27 DIAGNOSIS — R918 Other nonspecific abnormal finding of lung field: Secondary | ICD-10-CM | POA: Diagnosis not present

## 2022-02-27 DIAGNOSIS — I129 Hypertensive chronic kidney disease with stage 1 through stage 4 chronic kidney disease, or unspecified chronic kidney disease: Secondary | ICD-10-CM | POA: Insufficient documentation

## 2022-02-27 DIAGNOSIS — C7931 Secondary malignant neoplasm of brain: Secondary | ICD-10-CM | POA: Insufficient documentation

## 2022-02-27 NOTE — Progress Notes (Signed)
Medical Nutrition Therapy  ?Appointment Start time:  7829  Appointment End time:  5621 ? ?Primary concerns today: Weight loss, s/p chemo/cancer and diabetes type 2  ?Referral diagnosis: R 63.4, e11.8,  ?Preferred learning style: no preference ?Learning readiness: Contemplating  ? ? ?NUTRITION ASSESSMENT  ?Gained back her 7 lbs she had previously lost. ?She fell and broke her right knee cap. ?Appetite is doing better. Feels better. ?She appears pale today. History of anemia. Followed by GI/oncology. ?Her friends prepares meals. She notes she has been eating well. Doesn't crave sweets anymore ?Diet has some processed foods but mostly eats 'county food' at home. ?On oxygen. ?On Jardiance. Doesn't check blood sugars. Last Glucose was 86 mg/dl. ?Metformin 500 mg once a day. ?Diet still has concentrated sweets of sodas, koolaid and some  snacks. ? ? ?Has Type 2 DM. A1C 6.5% Doesn't usually check blood sugars often. ?  ?Currently taking Jardiance 10 mg and Metformin 500 mg a day. ? ?Anthropometrics  ?Wt Readings from Last 3 Encounters:  ?02/21/22 125 lb (56.7 kg)  ?01/09/22 124 lb 9.6 oz (56.5 kg)  ?12/26/21 118 lb (53.5 kg)  ? ?Ht Readings from Last 3 Encounters:  ?02/21/22 5' 6"  (1.676 m)  ?01/09/22 5' 6"  (1.676 m)  ?12/26/21 5' 6.75" (1.695 m)  ? ?There is no height or weight on file to calculate BMI. ?@BMIFA @ ?Facility age limit for growth percentiles is 20 years. ?Facility age limit for growth percentiles is 20 years. ? ? ?Clinical ?Medical Hx: see chart ?Medications: Metformin , Jardiance ?Labs:  ?Lab Results  ?Component Value Date  ? HGBA1C 6.5 (H) 03/01/2021  ? ? ?Notable Signs/Symptoms: cold, no appetite, fatigue, weak,  ? ?Lifestyle & Dietary Hx ?Lives with a friend who helps her with meals and transportation/appointments and medications. ? ?Estimated daily fluid intake: 80 oz ?Supplements:  ?Sleep: varies 4-5 hrs  ?Stress / self-care: her health ?Current average weekly physical activity: ADL ? ?24-Hr Dietary  Recall ?B) frosted mini wheat cereal and  whole banana,  OJ, coffe ?L) Bologna sandwich with cheese and cake, Dr. Malachi Bonds ?D) Chicken , salad , and french fries, Koolaid, ?Celery  ?Pringles in the evening ? ?Beverages: water and soda. ? ?Estimated Energy Needs ?Calories: 1800 ?Carbohydrate: 200g ?Protein: 135g ?Fat: 50g ? ? ?NUTRITION DIAGNOSIS  ?NI-1.4 Inadequate energy intake As related to Weight loss of 55 lbs and anorexia.  As evidenced by poor diet insuffient in calories, protein and overall nutrients and 55 lbs weight loss. ? ? ?NUTRITION INTERVENTION  ?Nutrition education (E-1) on the following topics:  ? Importance of good nutrition to meet her needs for cell repair, promote weight gain, build muscle and tissues. Discussed dangers of insuffinet nutrient intake to meet her needs contributing to possible malnutrition. ? Encouraged more plant based lifestyle  ?Lifestyle Medicine ? ?- Whole Food, Plant Predominant Nutrition is highly recommended: Eat Plenty of vegetables, Mushrooms, fruits, Legumes, Whole Grains, Nuts, seeds in lieu of processed meats, processed snacks/pastries red meat, poultry, eggs.  ?  ?-It is better to avoid simple carbohydrates including: Cakes, Sweet Desserts, Ice Cream, Soda (diet and regular), Sweet Tea, Candies, Chips, Cookies, Store Bought Juices, Alcohol in Excess of  1-2 drinks a day, Lemonade,  Artificial Sweeteners, Doughnuts, Coffee Creamers, "Sugar-free" Products, etc, etc.  This is not a complete list..... ?.  ? ?Handouts Provided Include  ?Lifestyle grocery list ? ?Learning Style & Readiness for Change ?Teaching method utilized: Visual & Auditory  ?Demonstrated degree of understanding via: Teach Back  ?  Barriers to learning/adherence to lifestyle change: Resistance to change eating habits ? ?Goals Established by Pt ?Goals ? ?Add dried beans to meals for more protein ?Try Spring Mix for salad instead of ice berg. ?Cut out processed foods of bacon, hot dogs and bologna. ?Get lunch  meat from deli instead of back with the hot dogs. ?Increase high fiber foods of fresh fruits and vegetables. ?Keep up the great job. ?Gain 5 lbs in the next 3 months. ? ? ? ?MONITORING & EVALUATION ?Dietary intake, weekly physical activity, and weight in 3 months. ?Would recommend to consider another oral agent than Jardiance she has has an issue with weight loss for her diabetes. ? ?Next Steps  ?Patient is to increase food intake of nutrient dense foods.. ? ?

## 2022-02-27 NOTE — Patient Instructions (Signed)
Goals ? ?Add dried beans to meals for more protein ?Try Spring Mix for salad instead of ice berg. ?Cut out processed foods of bacon, hot dogs and bologna. ?Get lunch meat from deli instead of back with the hot dogs. ?Increase high fiber foods of fresh fruits and vegetables. ?Keep up the great job. ?Gain 5 lbs in the next 3 months. ?

## 2022-02-28 DIAGNOSIS — J849 Interstitial pulmonary disease, unspecified: Secondary | ICD-10-CM | POA: Diagnosis not present

## 2022-02-28 DIAGNOSIS — J9611 Chronic respiratory failure with hypoxia: Secondary | ICD-10-CM | POA: Diagnosis not present

## 2022-03-02 ENCOUNTER — Other Ambulatory Visit: Payer: Self-pay | Admitting: Gastroenterology

## 2022-03-03 ENCOUNTER — Other Ambulatory Visit: Payer: Self-pay | Admitting: Surgical

## 2022-03-06 ENCOUNTER — Telehealth: Payer: Self-pay | Admitting: Pulmonary Disease

## 2022-03-06 IMAGING — CT CT CHEST W/ CM
2 of 5 series · 12 of 36 positions shown, 15 images · IV contrast (OMNIPAQUE)
Comparison: Most recent CT chest 03/16/2021. 06/08/2020 CT abdomen
and pelvis. 05/02/2020 PET-CT.

CLINICAL DATA: Primary Cancer Type: Lung
TECHNIQUE: Multidetector CT imaging of the chest, abdomen and pelvis was
performed following the standard protocol during bolus
administration of intravenous contrast.

CONTRAST:  80mL OMNIPAQUE IOHEXOL 350 MG/ML SOLN, additional oral
enteric contrast

[Series 2: cap with · axial · 0.81mm/px · z∈[-117,+408]mm · 9 of 129 slices shown, 12 images]
[im 12/129  mediastinal]
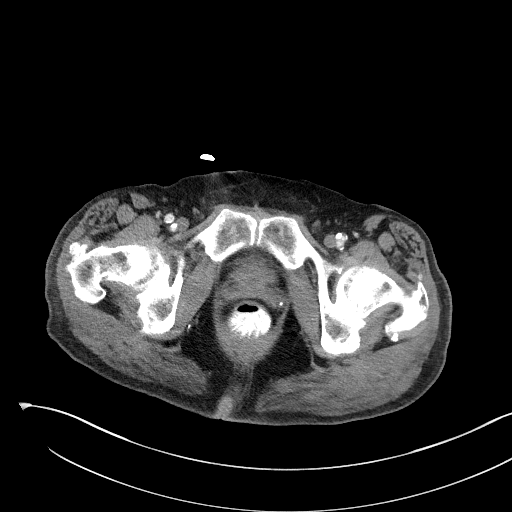
[im 12/129  lung]
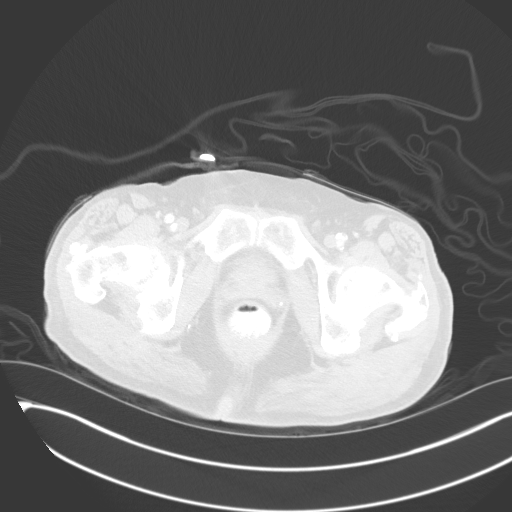
[im 24/129  lung]
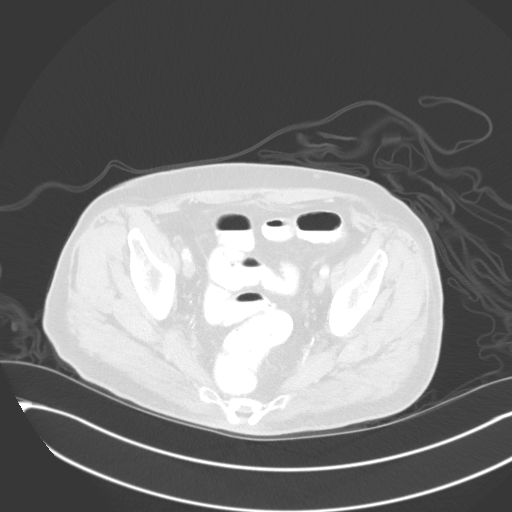
[im 35/129  lung]
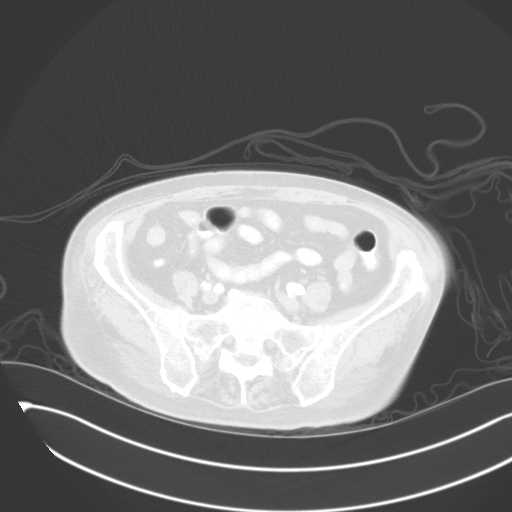
[im 47/129  lung]
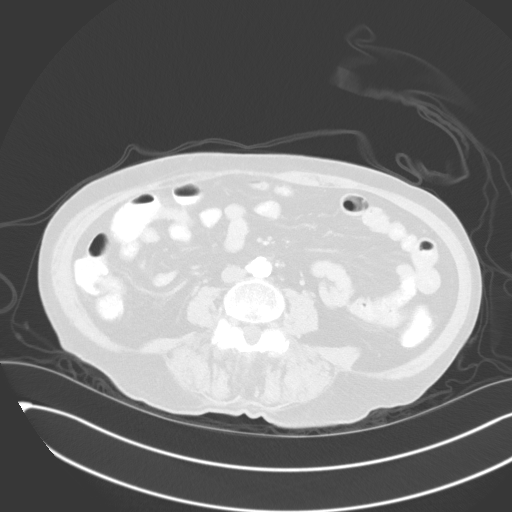
[im 70/129  mediastinal]
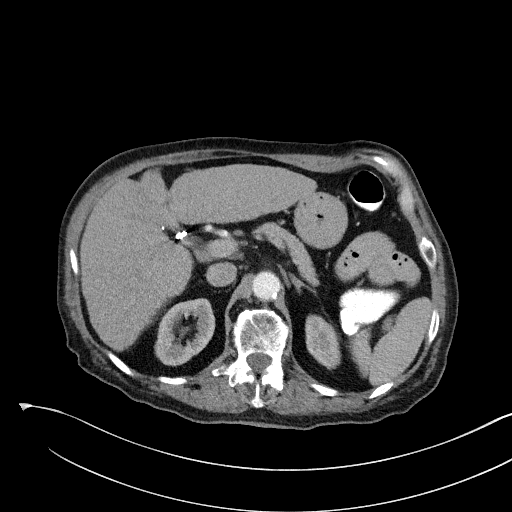
[im 70/129  lung]
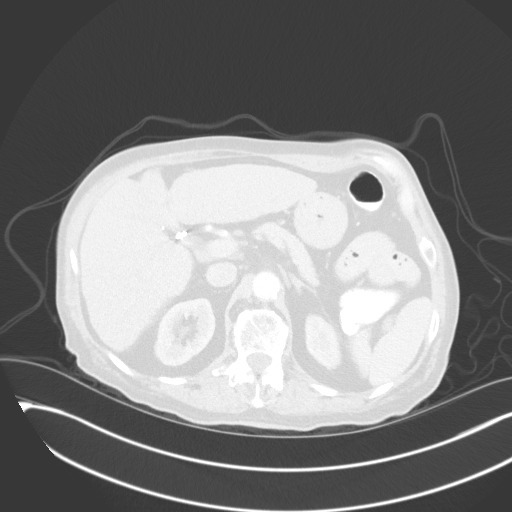
[im 82/129  lung]
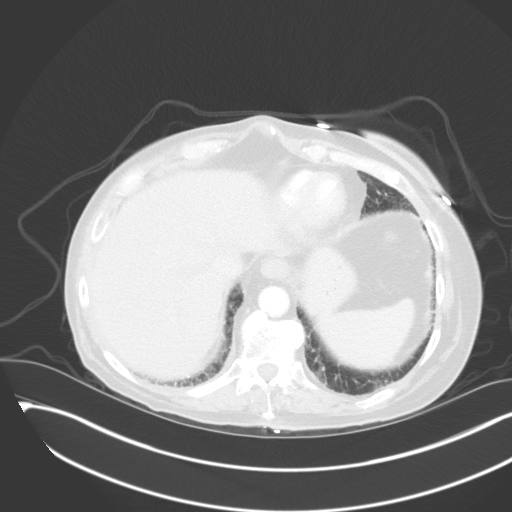
[im 94/129  lung]
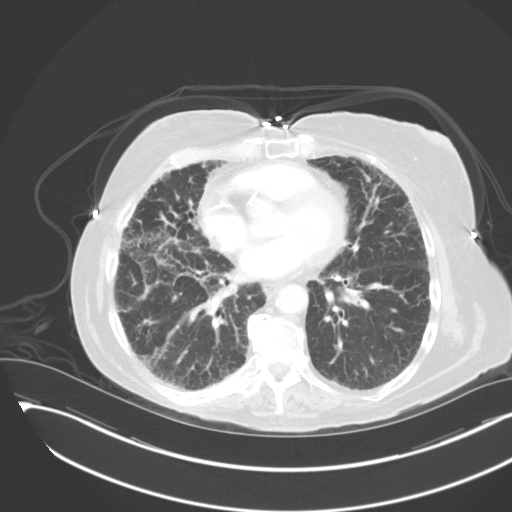
[im 105/129  lung]
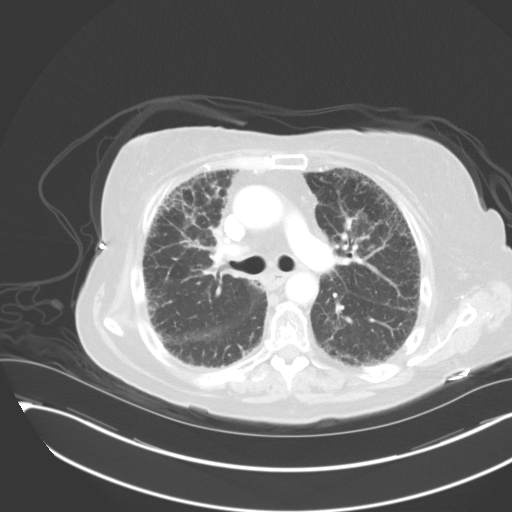
[im 117/129  mediastinal]
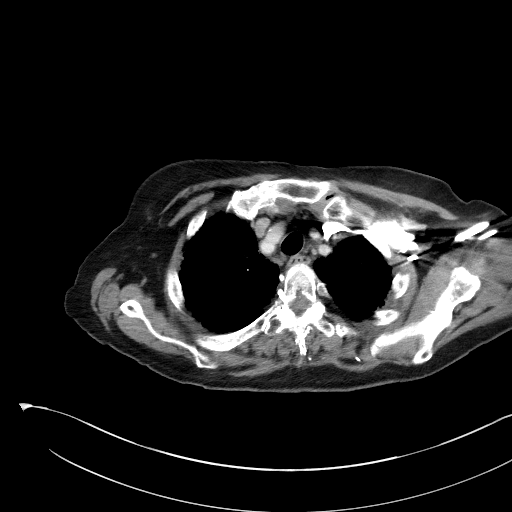
[im 117/129  lung]
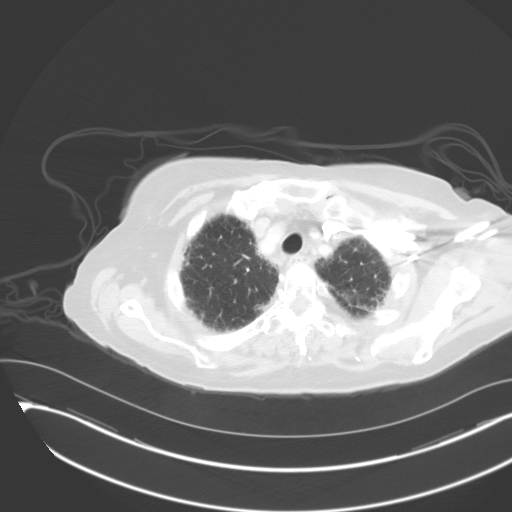

[Series 4: coronals · coronal · 0.79mm/px · 3 of 125 slices shown]
[im 25/125  lung]
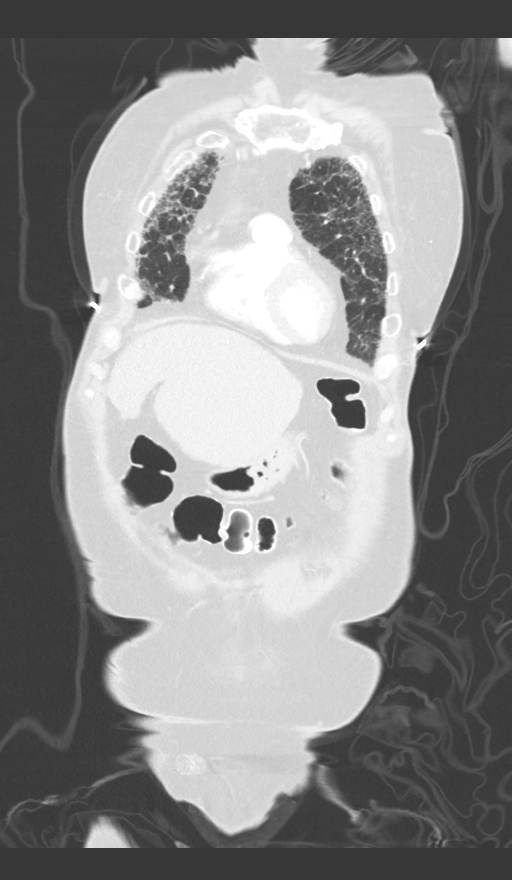
[im 50/125  lung]
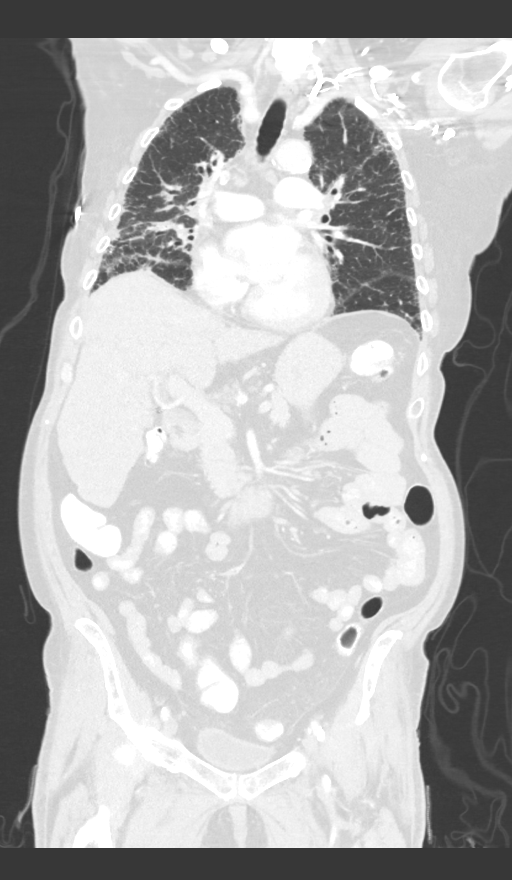
[im 75/125  lung]
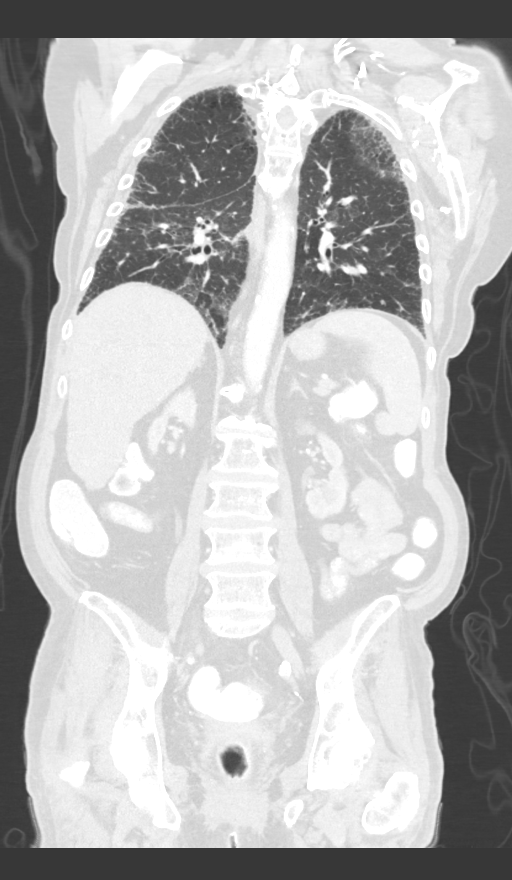

[12 of 36 positions shown; findings below may reference images not displayed]

Imaging Indication: Routine surveillance

Interval therapy since last imaging? No

Initial Cancer Diagnosis

Date: 05/20/2020; Established by: Biopsy-proven

Detailed Pathology: Extensive stage small cell lung cancer.

Primary Tumor location: Right middle lobe.  Solitary brain mass.

Surgeries: Cholecystectomy. Hysterectomy. Lavinia Jadrolinija dilation. Back
fusion.

Chemotherapy: Yes; Ongoing?  No; Most recent administration: [DATE]

Immunotherapy? No

Radiation therapy? Yes

Date Range: 03/02/2021 - 03/22/2021; Target: Brain

Date Range: 06/28/2020 - 08/08/2020; Target: Right lung

EXAM:
CT CHEST, ABDOMEN, AND PELVIS WITH CONTRAST
FINDINGS: CT CHEST FINDINGS

Cardiovascular: Aortic atherosclerosis. Aortic valve calcifications.
Normal heart size. Three-vessel coronary artery calcifications. No
pericardial effusion.

Mediastinum/Nodes: Unchanged soft tissue thickening about the right
hilum (series 2, image 26). No discretely enlarged mediastinal,
hilar, or axillary lymph nodes. Thyroid gland, trachea, and
esophagus demonstrate no significant findings.

Lungs/Pleura: Mild centrilobular and paraseptal emphysema. Mild
pulmonary fibrosis in a pattern without clear apical to basal
gradient, featuring irregular peripheral interstitial opacity and
septal thickening, unchanged. Unchanged post treatment appearance of
the mass of the lateral segment right middle lobe, measuring 2.7 x
2.0 cm (series 6, image 69). No pleural effusion or pneumothorax.

Musculoskeletal: No chest wall mass or suspicious bone lesions
identified.

CT ABDOMEN PELVIS FINDINGS

Hepatobiliary: No solid liver abnormality is seen. Coarse, nodular
contour of the liver. No gallstones, gallbladder wall thickening, or
biliary dilatation.

Pancreas: Unremarkable. No pancreatic ductal dilatation or
surrounding inflammatory changes.

Spleen: Normal in size without significant abnormality.

Adrenals/Urinary Tract: Adrenal glands are unremarkable. Kidneys are
normal, without renal calculi, solid lesion, or hydronephrosis.
Bladder is unremarkable.

Stomach/Bowel: Stomach is within normal limits. Appendix appears
normal. No evidence of bowel wall thickening, distention, or
inflammatory changes.

Vascular/Lymphatic: Aortic atherosclerosis. No enlarged abdominal or
pelvic lymph nodes.

Reproductive: Status post hysterectomy.

Other: No abdominal wall hernia or abnormality. No abdominopelvic
ascites.

Musculoskeletal: No acute or significant osseous findings.
IMPRESSION: 1. Unchanged post treatment appearance of the mass of the lateral
segment right middle lobe.
2. Unchanged soft tissue thickening about the right hilum. No
discretely enlarged mediastinal lymph nodes.
3. Mild pulmonary fibrosis in a pattern without clear apical to
basal gradient, featuring irregular peripheral interstitial opacity
and septal thickening, unchanged. Consider pulmonary referral and
dedicated I LD protocol examination of the chest to further
characterize.
4. Emphysema.
5. Coronary artery disease.
6. Aortic valve calcifications. Correlate for echocardiographic
evidence of aortic valve dysfunction.
7. Coarse, nodular contour of the liver, suggestive of cirrhosis.

Aortic Atherosclerosis (SQDV3-LDN.N) and Emphysema (SQDV3-FPG.E).

## 2022-03-06 IMAGING — CT CT ABD-PELV W/ CM
1 series · 1 of 1 positions shown · IV contrast (omnipaque)
Comparison: Most recent CT chest 03/16/2021. 06/08/2020 CT abdomen
and pelvis. 05/02/2020 PET-CT.

CLINICAL DATA: Primary Cancer Type: Lung
TECHNIQUE: Multidetector CT imaging of the chest, abdomen and pelvis was
performed following the standard protocol during bolus
administration of intravenous contrast.

CONTRAST:  80mL OMNIPAQUE IOHEXOL 350 MG/ML SOLN, additional oral
enteric contrast

[Series 1: topogram 1.0 tr20 · coronal · 2.00mm/px · 1 of 1 slices shown]
[im 1/1]
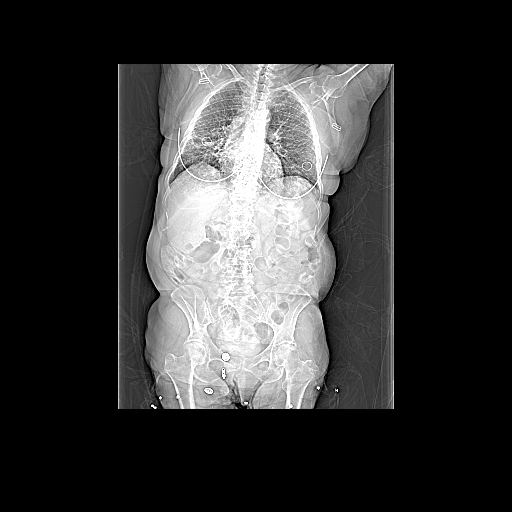

[1 of 1 positions shown; findings below may reference images not displayed]

Imaging Indication: Routine surveillance

Interval therapy since last imaging? No

Initial Cancer Diagnosis

Date: 05/20/2020; Established by: Biopsy-proven

Detailed Pathology: Extensive stage small cell lung cancer.

Primary Tumor location: Right middle lobe.  Solitary brain mass.

Surgeries: Cholecystectomy. Hysterectomy. Lavinia Jadrolinija dilation. Back
fusion.

Chemotherapy: Yes; Ongoing?  No; Most recent administration: [DATE]

Immunotherapy? No

Radiation therapy? Yes

Date Range: 03/02/2021 - 03/22/2021; Target: Brain

Date Range: 06/28/2020 - 08/08/2020; Target: Right lung

EXAM:
CT CHEST, ABDOMEN, AND PELVIS WITH CONTRAST
FINDINGS: CT CHEST FINDINGS

Cardiovascular: Aortic atherosclerosis. Aortic valve calcifications.
Normal heart size. Three-vessel coronary artery calcifications. No
pericardial effusion.

Mediastinum/Nodes: Unchanged soft tissue thickening about the right
hilum (series 2, image 26). No discretely enlarged mediastinal,
hilar, or axillary lymph nodes. Thyroid gland, trachea, and
esophagus demonstrate no significant findings.

Lungs/Pleura: Mild centrilobular and paraseptal emphysema. Mild
pulmonary fibrosis in a pattern without clear apical to basal
gradient, featuring irregular peripheral interstitial opacity and
septal thickening, unchanged. Unchanged post treatment appearance of
the mass of the lateral segment right middle lobe, measuring 2.7 x
2.0 cm (series 6, image 69). No pleural effusion or pneumothorax.

Musculoskeletal: No chest wall mass or suspicious bone lesions
identified.

CT ABDOMEN PELVIS FINDINGS

Hepatobiliary: No solid liver abnormality is seen. Coarse, nodular
contour of the liver. No gallstones, gallbladder wall thickening, or
biliary dilatation.

Pancreas: Unremarkable. No pancreatic ductal dilatation or
surrounding inflammatory changes.

Spleen: Normal in size without significant abnormality.

Adrenals/Urinary Tract: Adrenal glands are unremarkable. Kidneys are
normal, without renal calculi, solid lesion, or hydronephrosis.
Bladder is unremarkable.

Stomach/Bowel: Stomach is within normal limits. Appendix appears
normal. No evidence of bowel wall thickening, distention, or
inflammatory changes.

Vascular/Lymphatic: Aortic atherosclerosis. No enlarged abdominal or
pelvic lymph nodes.

Reproductive: Status post hysterectomy.

Other: No abdominal wall hernia or abnormality. No abdominopelvic
ascites.

Musculoskeletal: No acute or significant osseous findings.
IMPRESSION: 1. Unchanged post treatment appearance of the mass of the lateral
segment right middle lobe.
2. Unchanged soft tissue thickening about the right hilum. No
discretely enlarged mediastinal lymph nodes.
3. Mild pulmonary fibrosis in a pattern without clear apical to
basal gradient, featuring irregular peripheral interstitial opacity
and septal thickening, unchanged. Consider pulmonary referral and
dedicated I LD protocol examination of the chest to further
characterize.
4. Emphysema.
5. Coronary artery disease.
6. Aortic valve calcifications. Correlate for echocardiographic
evidence of aortic valve dysfunction.
7. Coarse, nodular contour of the liver, suggestive of cirrhosis.

Aortic Atherosclerosis (SQDV3-LDN.N) and Emphysema (SQDV3-FPG.E).

## 2022-03-06 NOTE — Telephone Encounter (Signed)
She is complaining of chest pain, increased oxygen demands and increased dyspnea, will need office visit to be evaluated.  Can call PCP or go to urgent care/ER if not able to come to [place] .   Please contact office for sooner follow up if symptoms do not improve or worsen or seek emergency care

## 2022-03-06 NOTE — Telephone Encounter (Signed)
Primary Pulmonologist: Dr. [name]   Last office visit and with whom: 12/13/21 Dr. [name]  What do we see them for (pulmonary problems): Resp. Failure with hypoxia/ILD/ Emphysema Last OV assessment/plan: see below   Was appointment offered to patient (explain)?  None available in [facility] office. Patient considering an appt in [place] and will accept/refuse when called back about triage message    Reason for call: Patient states she has been feeling that her breathing has been getting worse for a few weeks and that she has been having trouble with breathing. Has recently been raising O2 to 3LO2to keep sats above 90%. She states she recently had a fall and slid off the cough and has been having pain on her chest on side she fell on.   Offered patient an appt in [place] office to see an APP since Dr. [name] is out of office for 2 weeks but patient is unsure if she wants to come to [facility] office or not for an appt. She will let us know when we call back.   Tammy Parrett please advise.   Allergies  Allergen Reactions   Bee Venom Shortness Of Breath and Swelling   Iohexol Shortness Of Breath and Other (See Comments)    CHEST TIGHTNESS and BREATHING PROBLEMS- NEEDS PRE-MEDS 13 hours beforehand-  premeds given 06/08/2020 without incident    Tape Other (See Comments)    Tears the skin!!   Betadine [Povidone Iodine] Rash   Ciprofloxacin Rash   Latex Rash and Other (See Comments)    NO latex gloves!!    Penicillins Swelling    Did it involve swelling of the face/tongue/throat, SOB, or low BP? Yes Did it involve sudden or severe rash/hives, skin peeling, or any reaction on the inside of your mouth or nose? No Did you need to seek medical attention at a hospital or doctor's office? Yes When did it last happen?      1996 If all above answers are "NO", may proceed with cephalosporin use.   Povidone-Iodine Rash    Immunization History  Administered Date(s) Administered   Fluad Quad(high Dose 65+) 07/20/2020   H1N1  08/04/2008   Influenza Whole 07/18/2006, 07/15/2008   Influenza, High Dose Seasonal PF 06/19/2018   Influenza,inj,Quad PF,6+ Mos 07/06/2019   Moderna Sars-Covid-2 Vaccination 12/15/2019, 01/12/2020, 08/03/2020   PFIZER(Purple Top)SARS-COV-2 Vaccination 08/03/2020   Pneumococcal Polysaccharide-23 07/28/2006   Td 07/06/2012   Tdap 02/07/2015

## 2022-03-06 NOTE — Telephone Encounter (Signed)
Called and spoke with patient to let her know that Tammy would like for her to be seen in office. Patient expressed understanding. She has been scheduled for Friday. Advised her that if her symptoms get worse to call and see if there is a sooner opening or to go to urgent care. She expressed understanding. Nothing further needed at this time.

## 2022-03-06 NOTE — Telephone Encounter (Signed)
ATC LMTCB

## 2022-03-08 DIAGNOSIS — Z89211 Acquired absence of right upper limb below elbow: Secondary | ICD-10-CM | POA: Diagnosis not present

## 2022-03-09 ENCOUNTER — Ambulatory Visit (INDEPENDENT_AMBULATORY_CARE_PROVIDER_SITE_OTHER): Payer: Medicare HMO | Admitting: Nurse Practitioner

## 2022-03-09 ENCOUNTER — Encounter: Payer: Self-pay | Admitting: Nurse Practitioner

## 2022-03-09 ENCOUNTER — Ambulatory Visit (INDEPENDENT_AMBULATORY_CARE_PROVIDER_SITE_OTHER): Payer: Medicare HMO

## 2022-03-09 VITALS — BP 108/70 | HR 90 | Temp 97.8°F | Ht 66.0 in | Wt 124.0 lb

## 2022-03-09 DIAGNOSIS — J189 Pneumonia, unspecified organism: Secondary | ICD-10-CM | POA: Diagnosis not present

## 2022-03-09 DIAGNOSIS — C3490 Malignant neoplasm of unspecified part of unspecified bronchus or lung: Secondary | ICD-10-CM

## 2022-03-09 DIAGNOSIS — J9611 Chronic respiratory failure with hypoxia: Secondary | ICD-10-CM

## 2022-03-09 DIAGNOSIS — J90 Pleural effusion, not elsewhere classified: Secondary | ICD-10-CM | POA: Diagnosis not present

## 2022-03-09 DIAGNOSIS — M940 Chondrocostal junction syndrome [Tietze]: Secondary | ICD-10-CM | POA: Diagnosis not present

## 2022-03-09 DIAGNOSIS — R0789 Other chest pain: Secondary | ICD-10-CM | POA: Diagnosis not present

## 2022-03-09 DIAGNOSIS — J441 Chronic obstructive pulmonary disease with (acute) exacerbation: Secondary | ICD-10-CM

## 2022-03-09 DIAGNOSIS — C7931 Secondary malignant neoplasm of brain: Secondary | ICD-10-CM

## 2022-03-09 DIAGNOSIS — R0602 Shortness of breath: Secondary | ICD-10-CM | POA: Diagnosis not present

## 2022-03-09 MED ORDER — DOXYCYCLINE HYCLATE 100 MG PO TABS: 100.0000 mg | ORAL_TABLET | Freq: Two times a day (BID) | ORAL | 0 refills | Status: DC

## 2022-03-09 MED ORDER — TRELEGY ELLIPTA 100-62.5-25 MCG/ACT IN AEPB: 1.0000 | INHALATION_SPRAY | Freq: Every day | RESPIRATORY_TRACT | 0 refills | Status: AC

## 2022-03-09 MED ORDER — AZITHROMYCIN 250 MG PO TABS: ORAL_TABLET | ORAL | 0 refills | Status: DC

## 2022-03-09 MED ORDER — PREDNISONE 10 MG PO TABS: ORAL_TABLET | ORAL | 0 refills | Status: DC

## 2022-03-09 MED ORDER — LIDOCAINE 5 % EX PTCH: 1.0000 | MEDICATED_PATCH | CUTANEOUS | 0 refills | Status: AC

## 2022-03-09 NOTE — Patient Instructions (Addendum)
Continue Albuterol inhaler 2 puffs or 3 mL neb every 6 hours as needed for shortness of breath or wheezing. Notify if symptoms persist despite rescue inhaler/neb use. Continue Trelegy 1 puff daily. Brush tongue and rinse mouth afterwards Continue supplemental oxygen 2-3.5 lpm for goal >88-90%  Lidocaine patches - apply 1 patch every 24 hours, remove after 12 hours and discard  Tylenol 650 mg every 6 hours as needed for pain; do not take if you are taking your Percocet  Prednisone taper. 4 tabs for 2 days, then 3 tabs for 2 days, 2 tabs for 2 days, then 1 tab for 2 days, then stop. Take in AM with food Doxycyline 1 tab Twice daily for 7 days. Take with food. Wear sunscreen when outside Z pack (azithromycin). Take 2 tabs on day 1 followed by 1 tab daily for four additional days. Take with food.   Mucinex 600 mg Twice daily for chest congestion/cough  Contact your oncologist and neurologist to notify of worsening balance problems.   Follow up in 1 week with [name] or [name]. If symptoms do not improve or worsen, please contact office for sooner follow up or seek emergency care.

## 2022-03-09 NOTE — Assessment & Plan Note (Signed)
AECOPD related to pneumonia.  Will treat with prednisone taper.  Advised that she continue triple therapy with Trelegy and as needed albuterol.

## 2022-03-09 NOTE — Assessment & Plan Note (Signed)
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  Previous scan was clear.  She has had increasing balance instability and recent falls.  Advised that she notify her oncologist and neurologist so they can ensure no recurrence of disease.

## 2022-03-09 NOTE — Assessment & Plan Note (Signed)
Did not see any evidence of rib fracture on her CXR.  Suspect that musculoskeletal tenderness is related to costochondritis related to persistent coughing.  Advised that she use over-the-counter Tylenol and prescribed lidocaine patches.

## 2022-03-09 NOTE — Progress Notes (Signed)
@Patient  ID: [name], female    DOB: Aug 27, 1951, 71 y.o.   MRN: [id number]  Chief Complaint  Patient presents with   Follow-up    Patient says it hurts when she breathes.     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  She is a patient of Dr. [name] and last seen in office on 12/13/2021.  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 [name] and Dr. [name].  Past medical history significant for CAD s/p cardiac stents, IDA, hypertension, NSTEMI, CHF, allergic rhinitis, GERD, hypothyroid, peripheral neuropathy, chronic pain, CKD, HLD, anxiety, depression, history of EtOH abuse.  TEST/EVENTS:  12/19/2019 CT chest: Enlarged bilateral hilar and mediastinal lymph nodes up to 18 mm, small bilateral pleural effusions, diffuse interstitial and intralobular septal prominence, 4.6 x 4 cm area of consolidation right middle lobe, cirrhosis with small amount of ascites 12/21/2019 echocardiogram: EF 55 to 60%, G2 DD, mild LA dilation, mild/moderate MR 02/17/2020 A1 AT 131 05/12/2020 PFTs: FEV1 2.2 (87%), FEV1% 77, DLCO 58%.  Unable to do lung volume testing 09/03/2020 CT chest: Right middle lobe mass now 2.3 x 3.5 cm, subpleural reticulation/fibrosis at lung bases  12/13/2021: OV with Dr. [name].  CT chest from January showed decrease size of right middle lobe lesion, changes of emphysema and ILD.  Stable on 2 lpm supplemental O2.  Breathing stable and no acute respiratory symptoms.  Previously unable to complete PFTs.  Advised to continue Trelegy and as needed albuterol.  She does have probable UIP on CT scan but would not be a candidate for antifibrotic therapy given history of liver disease.  03/09/2022: Today-acute visit Patient presents today with advocate and service animal due to increased shortness of breath and pain when she takes  a big deep breath in.  On 5/17, she slid off her couch and landed on her right side; states she did not fall hard and did not experience any pain with the fall when she landed; no visible injury besides scraping her right elbow.  She feels like she has had increased shortness of breath and pain with inspiration since then.  She is also started developing more productive cough with purulent sputum.  She denies any fevers, night sweats, hemoptysis, lower extremity swelling.  They have increased her oxygen to 3.5 lpm at home and 3 LPM POC due to her shortness of breath however her sats have always been in the 90s.  She continues on Trelegy.  Has been using her albuterol a few times a day.  They also note that she has had some increased balance issues over the last few months.  She has also fallen more.  She did have previous mets to the brain which were radiated and last scan was clear.  She has follow-up scheduled next month with Dr. [name] and Dr. [name].   Allergies  Allergen Reactions   Bee Venom Shortness Of Breath and Swelling   Iohexol Shortness Of Breath and Other (See Comments)    CHEST TIGHTNESS and BREATHING PROBLEMS- NEEDS PRE-MEDS 13 hours beforehand-  premeds given 06/08/2020 without incident    Tape Other (See Comments)    Tears the skin!!   Betadine [Povidone Iodine] Rash   Ciprofloxacin Rash   Latex Rash and Other (See Comments)    NO latex gloves!!    Penicillins  Swelling    Did it involve swelling of the face/tongue/throat, SOB, or low BP? Yes Did it involve sudden or severe rash/hives, skin peeling, or any reaction on the inside of your mouth or nose? No Did you need to seek medical attention at a hospital or doctor's office? Yes When did it last happen?      1996 If all above answers are "NO", may proceed with cephalosporin use.   Povidone-Iodine Rash    Immunization History  Administered Date(s) Administered   Fluad Quad(high Dose 65+) 07/20/2020   H1N1 08/04/2008    Influenza Whole 07/18/2006, 07/15/2008   Influenza, High Dose Seasonal PF 06/19/2018   Influenza,inj,Quad PF,6+ Mos 07/06/2019   Moderna Sars-Covid-2 Vaccination 12/15/2019, 01/12/2020, 08/03/2020   PFIZER(Purple Top)SARS-COV-2 Vaccination 08/03/2020   Pneumococcal Polysaccharide-23 07/28/2006   Td 07/06/2012   Tdap 02/07/2015    Past Medical History:  Diagnosis Date   Acute on chronic diastolic CHF (congestive heart failure) ([place]) 02/28/2021   Allergic rhinitis    Amputation of hand, right ([place]) 1984   Anemia    Anxiety    ASCVD (arteriosclerotic cardiovascular disease)    MI in 96 requiring BMS CX; DES to M1 in 2000;normal coronary angiography in 2004   Cholelithiasis    COPD (chronic obstructive pulmonary disease) (HCC)    DDD (degenerative disc disease), lumbar    Depression    Diabetes mellitus    Type II   Diarrhea    DVT (deep venous thrombosis) ([place])    patient said no   GERD (gastroesophageal reflux disease)    Headache    migraine   History of kidney stones    2006   History of radiation therapy 06/28/2020-08/08/2020   right Lung; Dr. [name]   History of radiation therapy 03/22/2021   brain 03/02/2021-03/22/2021   Dr [name]   Hyperlipidemia    Hypertension    Hypotension    Hypothyroidism    IBS (irritable bowel syndrome)    Low back pain    Myocardial infarction [facility]) 01/1995   Nephrolithiasis 2006   stone extraction    Panic attacks    Peripheral neuropathy    Peripheral vascular disease (HCC)    legs   Pneumonia 12/22/2019   Sciatic pain    right   Small cell lung cancer ([place])    Tobacco abuse    Tremor    Weakness of extremity    left hand weakness    Tobacco History: Social History   Tobacco Use  Smoking Status Every Day   Packs/day: 0.25   Years: 40.00   Pack years: 10.00   Types: Cigarettes   Last attempt to quit: 08/30/2017   Years since quitting: 4.5  Smokeless Tobacco Never   Ready to quit: Not Answered Counseling  given: Not Answered   Outpatient Medications Prior to Visit  Medication Sig Dispense Refill   albuterol (PROVENTIL) (2.5 MG/3ML) 0.083% nebulizer solution Take 3 mLs (2.5 mg total) by nebulization every 6 (six) hours as needed for up to 30 doses for wheezing or shortness of breath. 90 mL 3   albuterol (VENTOLIN HFA) 108 (90 Base) MCG/ACT inhaler INHALE TWO PUFFS INTO THE LUNGS EVERY FOUR HOURS AS NEEDED FOR WHEEZING OR SHORTNESS OF BREATH 18 g 5   aspirin 81 MG chewable tablet Chew 1 tablet (81 mg total) by mouth 2 (two) times daily. 60 tablet 0   Benzocaine (BOIL-EASE EX) Apply 1 application. topically daily as needed (Boil).  carbamide peroxide (DEBROX) 6.5 % OTIC solution Place 5 drops into both ears daily as needed (ears).     colestipol (COLESTID) 1 g tablet TAKE 2 TABLETS BY MOUTH ONCE DAILY. DO NOT TAKE WITHIN TWO HOURS OF OTHER MEDS. 180 tablet 1   diclofenac Sodium (VOLTAREN) 1 % GEL Apply topically as needed.     docusate sodium (COLACE) 100 MG capsule Take 1 capsule (100 mg total) by mouth 2 (two) times daily. 10 capsule 0   empagliflozin (JARDIANCE) 25 MG TABS tablet Take 25 mg by mouth daily.     Ferrous Sulfate (IRON) 325 (65 Fe) MG TABS Take 1 tablet by mouth daily.     Fluticasone-Umeclidin-Vilant (TRELEGY ELLIPTA) 100-62.5-25 MCG/ACT AEPB Inhale 1 puff into the lungs daily. 60 each 0   hydrocortisone cream 1 % Apply 1 application topically daily as needed for itching.     levothyroxine (SYNTHROID) 112 MCG tablet      metFORMIN (GLUCOPHAGE) 500 MG tablet      Multiple Vitamin (MULTI VITAMIN) TABS      neomycin-bacitracin-polymyxin (NEOSPORIN) OINT Apply 1 application topically daily as needed for irritation or wound care.     Omega 3 1000 MG CAPS      oxyCODONE-acetaminophen (PERCOCET) 10-325 MG tablet Take 1 tablet by mouth every 6 (six) hours as needed for pain. 20 tablet 0   pantoprazole (PROTONIX) 40 MG tablet Take 40 mg by mouth daily.     PARoxetine (PAXIL) 20 MG  tablet Take 20 mg by mouth daily.     rosuvastatin (CRESTOR) 20 MG tablet Take 20 mg by mouth daily as needed.     Cholecalciferol (VITAMIN D3) 50 MCG (2000 UT) TABS Take 8,000 Units by mouth daily.     diazepam (VALIUM) 5 MG tablet Take 2.5 mg by mouth at bedtime. 1/2 tab qhs     furosemide (LASIX) 20 MG tablet Take 20 mg by mouth daily.     OXYGEN 2L at home     traZODone (DESYREL) 150 MG tablet Take 75 mg by mouth at bedtime.     No facility-administered medications prior to visit.     Review of Systems:   Constitutional: No weight loss or gain, night sweats, fevers, chills. +Fatigue, lassitude HEENT: No headaches, difficulty swallowing, tooth/dental problems, or sore throat. No sneezing, itching, ear ache, nasal congestion, or post nasal drip CV:  No chest pain, orthopnea, PND, swelling in lower extremities, anasarca, dizziness, palpitations, syncope Resp: + Chest tightness/discomfort with deep inspiration on right side; increased SOB; productive cough with purulent sputum.  No hemoptysis. No wheezing.  No chest wall deformity GI:  No heartburn, indigestion, abdominal pain, nausea, vomiting, diarrhea, change in bowel habits, loss of appetite, bloody stools.  GU: No dysuria, change in color of urine, urgency or frequency.  No flank pain, no hematuria  Skin: No rash, lesions, ulcerations MSK:  No joint pain or swelling.  No decreased range of motion.  No back pain. Neuro: No dizziness or lightheadedness. + Balance instability; frequent falls Psych: No depression or anxiety. Mood stable.     Physical Exam:  BP 108/70 (BP Location: Right Arm, Patient Position: Sitting, Cuff Size: Normal)   Pulse 90   Temp 97.8 F (36.6 C) (Oral)   Ht 5' 6"  (1.676 m)   Wt 124 lb (56.2 kg)   SpO2 95%   BMI 20.01 kg/m   GEN: Pleasant, interactive, chronically-ill appearing; in no acute distress. HEENT:  Normocephalic and atraumatic. PERRLA. Sclera white. Nasal  turbinates pink, moist and patent  bilaterally. No rhinorrhea present. Oropharynx pink and moist, without exudate or edema. No lesions, ulcerations, or postnasal drip.  NECK:  Supple w/ fair ROM. No JVD present. Normal carotid impulses w/o bruits. Thyroid symmetrical with no goiter or nodules palpated. No lymphadenopathy.   CV: RRR, no m/r/g, no peripheral edema. Pulses intact, +2 bilaterally. No cyanosis, pallor or clubbing. PULMONARY:  Unlabored, regular breathing.  Scattered expiratory wheezes and rhonchi bilaterally A&P . No accessory muscle use. No dullness to percussion. GI: BS present and normoactive. Soft, non-tender to palpation. No organomegaly or masses detected. No CVA tenderness. MSK: Tenderness to palpation right chest wall.  No erythema, warmth. Cap refil <2 sec all extrem. No deformities or joint swelling noted.  Neuro: A/Ox3. No focal deficits noted.   Skin: Warm, no lesions or rashe Psych: Normal affect and behavior. Judgement and thought content appropriate.     Lab Results:  CBC    Component Value Date/Time   WBC 4.3 02/21/2022 1511   WBC 6.0 12/28/2021 0232   RBC 3.51 (L) 02/21/2022 1511   RBC 2.92 (L) 12/28/2021 0232   HGB 9.8 (L) 02/21/2022 1511   HCT 32.2 (L) 02/21/2022 1511   PLT 226 02/21/2022 1511   MCV 92 02/21/2022 1511   MCH 27.9 02/21/2022 1511   MCH 29.1 12/28/2021 0232   MCHC 30.4 (L) 02/21/2022 1511   MCHC 31.1 12/28/2021 0232   RDW 16.0 (H) 02/21/2022 1511   LYMPHSABS 0.9 02/21/2022 1511   MONOABS 0.0 (L) 10/20/2021 1357   EOSABS 0.1 02/21/2022 1511   BASOSABS 0.0 02/21/2022 1511    BMET    Component Value Date/Time   NA 137 02/21/2022 1511   K 4.2 02/21/2022 1511   CL 99 02/21/2022 1511   CO2 23 02/21/2022 1511   GLUCOSE 94 02/21/2022 1511   GLUCOSE 113 (H) 12/26/2021 0956   BUN 9 02/21/2022 1511   CREATININE 0.84 02/21/2022 1511   CREATININE 1.06 (H) 10/20/2021 1357   CREATININE 0.85 05/03/2012 0942   CALCIUM 9.3 02/21/2022 1511   GFRNONAA >60 12/26/2021 0956    GFRNONAA 57 (L) 10/20/2021 1357   GFRAA 58 (L) 07/05/2020 1137    BNP    Component Value Date/Time   BNP 35.8 02/28/2021 1056     Imaging:  DG Chest 2 View  Result Date: 03/09/2022 CLINICAL DATA:  increased SOB; chest discomfort with deep breathing; recent fall on right side EXAM: CHEST - 2 VIEW COMPARISON:  Mar 03, 2021 FINDINGS: The heart size and mediastinal contours are within normal limits. There is consolidation at the paramediastinal right mid lung zone and is new. Small right pleural effusion. As before, again seen are the diffuse reticulonodular interstitial changes without significant interval change. The visualized skeletal structures are unremarkable. IMPRESSION: There is consolidation at the right paramediastinal mid lung zone and is new likely on the basis of pneumonia. Small right pleural effusion. Prominent reticulonodular interstitial changes without significant interval change. Electronically Signed   By: Frazier Richards M.D.   On: 03/09/2022 17:09   XR Knee 1-2 Views Right  Result Date: 02/18/2022 AP and lateral views of right knee reviewed.  Right patella fracture in good position and alignment with excellent alignment of the articular surface of the patella.  No change in fracture alignment or any displacement since prior radiographs.  Fracture line still identified with some increased fracture consolidation since last radiographs.        Latest Ref Rng & Units  05/12/2020    4:23 PM  PFT Results  FVC-Pre L 2.87    FVC-Predicted Pre % 86    Pre FEV1/FVC % % 77    FEV1-Pre L 2.20    FEV1-Predicted Pre % 87    DLCO uncorrected ml/min/mmHg 12.27    DLCO UNC% % 58    DLCO corrected ml/min/mmHg 12.27    DLCO COR %Predicted % 58    DLVA Predicted % 76      No results found for: NITRICOXIDE      Assessment & Plan:   CAP (community acquired pneumonia) New consolidation at paramediastinal right midlung zone, consistent with pneumonia based on symptoms.  Nontoxic  and in no acute distress.  Vital signs are stable.  She is allergic to Augmentin and Cipro.  We will treat with doxycycline and azithromycin.  Advised to use nebs at least twice a day.  Close follow-up and strict ED precautions.  Patient Instructions  Continue Albuterol inhaler 2 puffs or 3 mL neb every 6 hours as needed for shortness of breath or wheezing. Notify if symptoms persist despite rescue inhaler/neb use. Continue Trelegy 1 puff daily. Brush tongue and rinse mouth afterwards Continue supplemental oxygen 2-3.5 lpm for goal >88-90%  Lidocaine patches - apply 1 patch every 24 hours, remove after 12 hours and discard  Tylenol 650 mg every 6 hours as needed for pain; do not take if you are taking your Percocet  Prednisone taper. 4 tabs for 2 days, then 3 tabs for 2 days, 2 tabs for 2 days, then 1 tab for 2 days, then stop. Take in AM with food Doxycyline 1 tab Twice daily for 7 days. Take with food. Wear sunscreen when outside Z pack (azithromycin). Take 2 tabs on day 1 followed by 1 tab daily for four additional days. Take with food.   Mucinex 600 mg Twice daily for chest congestion/cough  Contact your oncologist and neurologist to notify of worsening balance problems.   Follow up in 1 week with Dr. [name] or Alanson Aly. If symptoms do not improve or worsen, please contact office for sooner follow up or seek emergency care.    COPD with acute exacerbation ([place]) AECOPD related to pneumonia.  Will treat with prednisone taper.  Advised that she continue triple therapy with Trelegy and as needed albuterol.  Chronic respiratory failure with hypoxia (HCC) Oxygen level stable in the 90s.  They did increase her to 3.5 lpm at home due to her increased shortness of breath.  Advised they continue to use 2-3.5 lpm for goal >88-90%; educated to not increase unless her oxygen is below this.  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  Previous scan was clear.   She has had increasing balance instability and recent falls.  Advised that she notify her oncologist and neurologist so they can ensure no recurrence of disease.  Costochondritis Did not see any evidence of rib fracture on her CXR.  Suspect that musculoskeletal tenderness is related to costochondritis related to persistent coughing.  Advised that she use over-the-counter Tylenol and prescribed lidocaine patches.   I spent 45 minutes of dedicated to the care of this patient on the date of this encounter to include pre-visit review of records, face-to-face time with the patient discussing conditions above, post visit ordering of testing, clinical documentation with the electronic health record, making appropriate referrals as documented, and communicating necessary findings to members of the patients care team.  Clayton Bibles,  NP 03/09/2022  Pt aware and understands NP's role.

## 2022-03-09 NOTE — Assessment & Plan Note (Addendum)
New consolidation at paramediastinal right midlung zone, consistent with pneumonia based on symptoms.  Nontoxic and in no acute distress.  Vital signs are stable.  She is allergic to Augmentin and Cipro.  We will treat with doxycycline and azithromycin.  Advised to use nebs at least twice a day.  Close follow-up and strict ED precautions.  Patient Instructions  Continue Albuterol inhaler 2 puffs or 3 mL neb every 6 hours as needed for shortness of breath or wheezing. Notify if symptoms persist despite rescue inhaler/neb use. Continue Trelegy 1 puff daily. Brush tongue and rinse mouth afterwards Continue supplemental oxygen 2-3.5 lpm for goal >88-90%  Lidocaine patches - apply 1 patch every 24 hours, remove after 12 hours and discard  Tylenol 650 mg every 6 hours as needed for pain; do not take if you are taking your Percocet  Prednisone taper. 4 tabs for 2 days, then 3 tabs for 2 days, 2 tabs for 2 days, then 1 tab for 2 days, then stop. Take in AM with food Doxycyline 1 tab Twice daily for 7 days. Take with food. Wear sunscreen when outside Z pack (azithromycin). Take 2 tabs on day 1 followed by 1 tab daily for four additional days. Take with food.   Mucinex 600 mg Twice daily for chest congestion/cough  Contact your oncologist and neurologist to notify of worsening balance problems.   Follow up in 1 week with Dr. [name] or Alanson Aly. If symptoms do not improve or worsen, please contact office for sooner follow up or seek emergency care.

## 2022-03-09 NOTE — Assessment & Plan Note (Addendum)
Oxygen level stable in the 90s.  They did increase her to 3.5 lpm at home due to her increased shortness of breath.  Advised they continue to use 2-3.5 lpm for goal >88-90%; educated to not increase unless her oxygen is below this.

## 2022-03-13 DIAGNOSIS — F411 Generalized anxiety disorder: Secondary | ICD-10-CM | POA: Diagnosis not present

## 2022-03-13 DIAGNOSIS — F329 Major depressive disorder, single episode, unspecified: Secondary | ICD-10-CM | POA: Diagnosis not present

## 2022-03-14 NOTE — Progress Notes (Signed)
Reviewed and agree with assessment/plan.   Chesley Mires, MD [facility] Pulmonary/Critical Care 03/14/2022, 12:50 PM Pager:  [phone]

## 2022-03-15 ENCOUNTER — Encounter: Payer: Self-pay | Admitting: Nurse Practitioner

## 2022-03-15 ENCOUNTER — Ambulatory Visit (INDEPENDENT_AMBULATORY_CARE_PROVIDER_SITE_OTHER): Payer: Medicare HMO | Admitting: Nurse Practitioner

## 2022-03-15 ENCOUNTER — Other Ambulatory Visit: Payer: Self-pay

## 2022-03-15 ENCOUNTER — Inpatient Hospital Stay (HOSPITAL_COMMUNITY)
Admission: EM | Admit: 2022-03-15 | Discharge: 2022-03-18 | DRG: 193 | Disposition: A | Discharge status: Home / Self Care | Payer: Medicare HMO | Source: Ambulatory Visit | Attending: Internal Medicine | Admitting: Internal Medicine

## 2022-03-15 ENCOUNTER — Emergency Department (HOSPITAL_COMMUNITY): Payer: Medicare HMO

## 2022-03-15 ENCOUNTER — Encounter (HOSPITAL_COMMUNITY): Payer: Self-pay | Admitting: Emergency Medicine

## 2022-03-15 VITALS — BP 124/68 | HR 87 | Temp 98.0°F | Ht 66.0 in | Wt 124.0 lb

## 2022-03-15 DIAGNOSIS — Z923 Personal history of irradiation: Secondary | ICD-10-CM

## 2022-03-15 DIAGNOSIS — D509 Iron deficiency anemia, unspecified: Secondary | ICD-10-CM | POA: Diagnosis present

## 2022-03-15 DIAGNOSIS — Z880 Allergy status to penicillin: Secondary | ICD-10-CM

## 2022-03-15 DIAGNOSIS — C342 Malignant neoplasm of middle lobe, bronchus or lung: Secondary | ICD-10-CM | POA: Diagnosis not present

## 2022-03-15 DIAGNOSIS — E782 Mixed hyperlipidemia: Secondary | ICD-10-CM | POA: Diagnosis present

## 2022-03-15 DIAGNOSIS — E1151 Type 2 diabetes mellitus with diabetic peripheral angiopathy without gangrene: Secondary | ICD-10-CM | POA: Diagnosis present

## 2022-03-15 DIAGNOSIS — I5043 Acute on chronic combined systolic (congestive) and diastolic (congestive) heart failure: Secondary | ICD-10-CM | POA: Diagnosis present

## 2022-03-15 DIAGNOSIS — J9621 Acute and chronic respiratory failure with hypoxia: Secondary | ICD-10-CM | POA: Diagnosis not present

## 2022-03-15 DIAGNOSIS — J9601 Acute respiratory failure with hypoxia: Secondary | ICD-10-CM | POA: Diagnosis not present

## 2022-03-15 DIAGNOSIS — Z85118 Personal history of other malignant neoplasm of bronchus and lung: Secondary | ICD-10-CM | POA: Diagnosis not present

## 2022-03-15 DIAGNOSIS — R7989 Other specified abnormal findings of blood chemistry: Secondary | ICD-10-CM

## 2022-03-15 DIAGNOSIS — K7460 Unspecified cirrhosis of liver: Secondary | ICD-10-CM | POA: Diagnosis present

## 2022-03-15 DIAGNOSIS — E1142 Type 2 diabetes mellitus with diabetic polyneuropathy: Secondary | ICD-10-CM | POA: Diagnosis present

## 2022-03-15 DIAGNOSIS — J189 Pneumonia, unspecified organism: Principal | ICD-10-CM | POA: Diagnosis present

## 2022-03-15 DIAGNOSIS — Z811 Family history of alcohol abuse and dependence: Secondary | ICD-10-CM

## 2022-03-15 DIAGNOSIS — J441 Chronic obstructive pulmonary disease with (acute) exacerbation: Secondary | ICD-10-CM | POA: Diagnosis not present

## 2022-03-15 DIAGNOSIS — I2510 Atherosclerotic heart disease of native coronary artery without angina pectoris: Secondary | ICD-10-CM | POA: Diagnosis present

## 2022-03-15 DIAGNOSIS — J95811 Postprocedural pneumothorax: Secondary | ICD-10-CM | POA: Diagnosis not present

## 2022-03-15 DIAGNOSIS — Z955 Presence of coronary angioplasty implant and graft: Secondary | ICD-10-CM | POA: Diagnosis not present

## 2022-03-15 DIAGNOSIS — Z91040 Latex allergy status: Secondary | ICD-10-CM

## 2022-03-15 DIAGNOSIS — J9383 Other pneumothorax: Secondary | ICD-10-CM | POA: Diagnosis not present

## 2022-03-15 DIAGNOSIS — C7931 Secondary malignant neoplasm of brain: Secondary | ICD-10-CM | POA: Diagnosis not present

## 2022-03-15 DIAGNOSIS — I447 Left bundle-branch block, unspecified: Secondary | ICD-10-CM | POA: Diagnosis present

## 2022-03-15 DIAGNOSIS — Z91048 Other nonmedicinal substance allergy status: Secondary | ICD-10-CM

## 2022-03-15 DIAGNOSIS — F17200 Nicotine dependence, unspecified, uncomplicated: Secondary | ICD-10-CM | POA: Diagnosis not present

## 2022-03-15 DIAGNOSIS — J90 Pleural effusion, not elsewhere classified: Secondary | ICD-10-CM | POA: Diagnosis not present

## 2022-03-15 DIAGNOSIS — R0902 Hypoxemia: Secondary | ICD-10-CM | POA: Diagnosis not present

## 2022-03-15 DIAGNOSIS — D179 Benign lipomatous neoplasm, unspecified: Secondary | ICD-10-CM | POA: Diagnosis present

## 2022-03-15 DIAGNOSIS — J939 Pneumothorax, unspecified: Secondary | ICD-10-CM | POA: Diagnosis not present

## 2022-03-15 DIAGNOSIS — J440 Chronic obstructive pulmonary disease with acute lower respiratory infection: Secondary | ICD-10-CM | POA: Diagnosis present

## 2022-03-15 DIAGNOSIS — F410 Panic disorder [episodic paroxysmal anxiety] without agoraphobia: Secondary | ICD-10-CM | POA: Diagnosis present

## 2022-03-15 DIAGNOSIS — I110 Hypertensive heart disease with heart failure: Secondary | ICD-10-CM | POA: Diagnosis not present

## 2022-03-15 DIAGNOSIS — I429 Cardiomyopathy, unspecified: Secondary | ICD-10-CM | POA: Diagnosis present

## 2022-03-15 DIAGNOSIS — F17210 Nicotine dependence, cigarettes, uncomplicated: Secondary | ICD-10-CM | POA: Diagnosis present

## 2022-03-15 DIAGNOSIS — Z7984 Long term (current) use of oral hypoglycemic drugs: Secondary | ICD-10-CM

## 2022-03-15 DIAGNOSIS — Z818 Family history of other mental and behavioral disorders: Secondary | ICD-10-CM

## 2022-03-15 DIAGNOSIS — E1165 Type 2 diabetes mellitus with hyperglycemia: Secondary | ICD-10-CM | POA: Diagnosis present

## 2022-03-15 DIAGNOSIS — Z882 Allergy status to sulfonamides status: Secondary | ICD-10-CM

## 2022-03-15 DIAGNOSIS — I252 Old myocardial infarction: Secondary | ICD-10-CM | POA: Diagnosis not present

## 2022-03-15 DIAGNOSIS — Z79899 Other long term (current) drug therapy: Secondary | ICD-10-CM

## 2022-03-15 DIAGNOSIS — Z79890 Hormone replacement therapy (postmenopausal): Secondary | ICD-10-CM

## 2022-03-15 DIAGNOSIS — Z888 Allergy status to other drugs, medicaments and biological substances status: Secondary | ICD-10-CM

## 2022-03-15 DIAGNOSIS — Z800 Family history of malignant neoplasm of digestive organs: Secondary | ICD-10-CM

## 2022-03-15 DIAGNOSIS — I509 Heart failure, unspecified: Secondary | ICD-10-CM | POA: Diagnosis not present

## 2022-03-15 DIAGNOSIS — Z9109 Other allergy status, other than to drugs and biological substances: Secondary | ICD-10-CM

## 2022-03-15 DIAGNOSIS — Z7951 Long term (current) use of inhaled steroids: Secondary | ICD-10-CM

## 2022-03-15 DIAGNOSIS — R0602 Shortness of breath: Secondary | ICD-10-CM | POA: Diagnosis not present

## 2022-03-15 DIAGNOSIS — Y95 Nosocomial condition: Secondary | ICD-10-CM | POA: Diagnosis not present

## 2022-03-15 DIAGNOSIS — K219 Gastro-esophageal reflux disease without esophagitis: Secondary | ICD-10-CM | POA: Diagnosis not present

## 2022-03-15 DIAGNOSIS — K635 Polyp of colon: Secondary | ICD-10-CM | POA: Diagnosis present

## 2022-03-15 DIAGNOSIS — E039 Hypothyroidism, unspecified: Secondary | ICD-10-CM | POA: Diagnosis not present

## 2022-03-15 DIAGNOSIS — E119 Type 2 diabetes mellitus without complications: Secondary | ICD-10-CM | POA: Diagnosis not present

## 2022-03-15 DIAGNOSIS — N1831 Chronic kidney disease, stage 3a: Secondary | ICD-10-CM | POA: Diagnosis not present

## 2022-03-15 DIAGNOSIS — I5033 Acute on chronic diastolic (congestive) heart failure: Secondary | ICD-10-CM | POA: Diagnosis not present

## 2022-03-15 DIAGNOSIS — Z7982 Long term (current) use of aspirin: Secondary | ICD-10-CM

## 2022-03-15 DIAGNOSIS — Z794 Long term (current) use of insulin: Secondary | ICD-10-CM | POA: Diagnosis not present

## 2022-03-15 LAB — BASIC METABOLIC PANEL
Anion gap: 7 (ref 5–15)
BUN: 21 mg/dL (ref 8–23)
CO2: 28 mmol/L (ref 22–32)
Calcium: 9.3 mg/dL (ref 8.9–10.3)
Chloride: 100 mmol/L (ref 98–111)
Creatinine, Ser: 0.92 mg/dL (ref 0.44–1.00)
GFR, Estimated: >60 mL/min (ref >60)
Glucose, Bld: 224 mg/dL — ABNORMAL HIGH (ref 70–99)
Potassium: 4.2 mmol/L (ref 3.5–5.1)
Sodium: 135 mmol/L (ref 135–145)

## 2022-03-15 LAB — LACTIC ACID, PLASMA
Lactic Acid, Venous: 2 mmol/L (ref 0.5–1.9)
Lactic Acid, Venous: 2.8 mmol/L (ref 0.5–1.9)
Lactic Acid, Venous: 1.6 mmol/L (ref 0.5–1.9)

## 2022-03-15 LAB — CBC
HCT: 31 % — ABNORMAL LOW (ref 36.0–46.0)
Hemoglobin: 9.1 g/dL — ABNORMAL LOW (ref 12.0–15.0)
MCH: 27.8 pg (ref 26.0–34.0)
MCHC: 29.4 g/dL — ABNORMAL LOW (ref 30.0–36.0)
MCV: 94.8 fL (ref 80.0–100.0)
Platelets: 201 10*3/uL (ref 150–400)
RBC: 3.27 MIL/uL — ABNORMAL LOW (ref 3.87–5.11)
RDW: 16.4 % — ABNORMAL HIGH (ref 11.5–15.5)
WBC: 4 10*3/uL (ref 4.0–10.5)
nRBC: 0 % (ref 0.0–0.2)

## 2022-03-15 LAB — BRAIN NATRIURETIC PEPTIDE: B Natriuretic Peptide: 1607 pg/mL — ABNORMAL HIGH (ref 0.0–100.0)

## 2022-03-15 LAB — GLUCOSE, CAPILLARY: Glucose-Capillary: 180 mg/dL — ABNORMAL HIGH (ref 70–99)

## 2022-03-15 MED ORDER — VANCOMYCIN HCL 1250 MG/250ML IV SOLN
1250.0000 mg | Freq: Once | INTRAVENOUS | Status: AC
  Administered 2022-03-15: 1250 mg via INTRAVENOUS
  Filled 2022-03-15: qty 250

## 2022-03-15 MED ORDER — IPRATROPIUM-ALBUTEROL 0.5-2.5 (3) MG/3ML IN SOLN
3.0000 mL | Freq: Four times a day (QID) | RESPIRATORY_TRACT | Status: DC
  Administered 2022-03-15: 3 mL via RESPIRATORY_TRACT
  Filled 2022-03-15: qty 3

## 2022-03-15 MED ORDER — TRAZODONE HCL 50 MG PO TABS
25.0000 mg | ORAL_TABLET | Freq: Once | ORAL | Status: AC
  Administered 2022-03-15: 25 mg via ORAL
  Filled 2022-03-15: qty 1

## 2022-03-15 MED ORDER — ONDANSETRON HCL 4 MG/2ML IJ SOLN: 4.0000 mg | Freq: Four times a day (QID) | INTRAMUSCULAR | Status: DC | PRN

## 2022-03-15 MED ORDER — VANCOMYCIN HCL 750 MG/150ML IV SOLN: 750.0000 mg | INTRAVENOUS | Status: DC

## 2022-03-15 MED ORDER — INSULIN ASPART 100 UNIT/ML IJ SOLN: 0.0000 [IU] | Freq: Every day | INTRAMUSCULAR | Status: DC

## 2022-03-15 MED ORDER — ROSUVASTATIN CALCIUM 20 MG PO TABS
20.0000 mg | ORAL_TABLET | Freq: Every day | ORAL | Status: DC
  Administered 2022-03-16 – 2022-03-18 (×3): 20 mg via ORAL
  Filled 2022-03-15 (×3): qty 1

## 2022-03-15 MED ORDER — IPRATROPIUM-ALBUTEROL 0.5-2.5 (3) MG/3ML IN SOLN: 3.0000 mL | RESPIRATORY_TRACT | Status: DC | PRN

## 2022-03-15 MED ORDER — ACETAMINOPHEN 325 MG PO TABS
650.0000 mg | ORAL_TABLET | Freq: Four times a day (QID) | ORAL | Status: DC | PRN
  Filled 2022-03-15: qty 2

## 2022-03-15 MED ORDER — SODIUM CHLORIDE 0.9 % IV SOLN: 1.0000 g | Freq: Once | INTRAVENOUS | Status: DC

## 2022-03-15 MED ORDER — FERROUS SULFATE 325 (65 FE) MG PO TABS
325.0000 mg | ORAL_TABLET | Freq: Every day | ORAL | Status: DC
  Administered 2022-03-16 – 2022-03-18 (×3): 325 mg via ORAL
  Filled 2022-03-15 (×3): qty 1

## 2022-03-15 MED ORDER — PANTOPRAZOLE SODIUM 40 MG PO TBEC
40.0000 mg | DELAYED_RELEASE_TABLET | Freq: Every day | ORAL | Status: DC
  Administered 2022-03-15 – 2022-03-18 (×4): 40 mg via ORAL
  Filled 2022-03-15 (×4): qty 1

## 2022-03-15 MED ORDER — METHYLPREDNISOLONE SODIUM SUCC 125 MG IJ SOLR
125.0000 mg | Freq: Once | INTRAMUSCULAR | Status: AC
Start: 2022-03-15 — End: 2022-03-15
  Administered 2022-03-15: 125 mg via INTRAVENOUS
  Filled 2022-03-15: qty 2

## 2022-03-15 MED ORDER — ENOXAPARIN SODIUM 40 MG/0.4ML IJ SOSY
40.0000 mg | PREFILLED_SYRINGE | INTRAMUSCULAR | Status: DC
  Administered 2022-03-15 – 2022-03-17 (×3): 40 mg via SUBCUTANEOUS
  Filled 2022-03-15 (×2): qty 0.4

## 2022-03-15 MED ORDER — ONDANSETRON HCL 4 MG PO TABS: 4.0000 mg | ORAL_TABLET | Freq: Four times a day (QID) | ORAL | Status: DC | PRN

## 2022-03-15 MED ORDER — IPRATROPIUM-ALBUTEROL 0.5-2.5 (3) MG/3ML IN SOLN
3.0000 mL | Freq: Three times a day (TID) | RESPIRATORY_TRACT | Status: DC
  Administered 2022-03-16 – 2022-03-18 (×6): 3 mL via RESPIRATORY_TRACT
  Filled 2022-03-15 (×7): qty 3

## 2022-03-15 MED ORDER — DM-GUAIFENESIN ER 30-600 MG PO TB12
1.0000 | ORAL_TABLET | Freq: Two times a day (BID) | ORAL | Status: DC
  Administered 2022-03-15 – 2022-03-18 (×6): 1 via ORAL
  Filled 2022-03-15 (×6): qty 1

## 2022-03-15 MED ORDER — SODIUM CHLORIDE 0.9 % IV SOLN: 500.0000 mg | Freq: Once | INTRAVENOUS | Status: DC

## 2022-03-15 MED ORDER — LEVOTHYROXINE SODIUM 112 MCG PO TABS
112.0000 ug | ORAL_TABLET | Freq: Every day | ORAL | Status: DC
  Administered 2022-03-16 – 2022-03-18 (×3): 112 ug via ORAL
  Filled 2022-03-15 (×3): qty 1

## 2022-03-15 MED ORDER — SODIUM CHLORIDE 0.9 % IV SOLN
2.0000 g | Freq: Three times a day (TID) | INTRAVENOUS | Status: DC
  Administered 2022-03-16 (×2): 2 g via INTRAVENOUS
  Filled 2022-03-15 (×2): qty 12.5

## 2022-03-15 MED ORDER — ASPIRIN 81 MG PO CHEW
81.0000 mg | CHEWABLE_TABLET | Freq: Two times a day (BID) | ORAL | Status: DC
  Administered 2022-03-15 – 2022-03-16 (×2): 81 mg via ORAL
  Filled 2022-03-15 (×2): qty 1

## 2022-03-15 MED ORDER — METHYLPREDNISOLONE SODIUM SUCC 40 MG IJ SOLR
40.0000 mg | Freq: Two times a day (BID) | INTRAMUSCULAR | Status: DC
  Administered 2022-03-16 – 2022-03-18 (×5): 40 mg via INTRAVENOUS
  Filled 2022-03-15 (×5): qty 1

## 2022-03-15 MED ORDER — INSULIN ASPART 100 UNIT/ML IJ SOLN: 0.0000 [IU] | Freq: Three times a day (TID) | INTRAMUSCULAR | Status: DC

## 2022-03-15 MED ORDER — ACETAMINOPHEN 650 MG RE SUPP: 650.0000 mg | Freq: Four times a day (QID) | RECTAL | Status: DC | PRN

## 2022-03-15 MED ORDER — COLESTIPOL HCL 1 G PO TABS
1.0000 g | ORAL_TABLET | Freq: Every day | ORAL | Status: DC
  Administered 2022-03-16 – 2022-03-18 (×3): 1 g via ORAL
  Filled 2022-03-15 (×4): qty 1

## 2022-03-15 NOTE — Assessment & Plan Note (Addendum)
Clinically worse. VS stable. At this point, she has failed outpatient treatment as we are very limited on abx choices given her allergies. I advised that the safest option for her is to be hospitalized with further workup and IV abx and steroids. Very hesitant initially but after lengthy discussion with her and her partner, she agreed. Declined EMS transport; will go via private vehicle. She wants to go to [facility] - called report to ED charge nurse, Otila Kluver.

## 2022-03-15 NOTE — ED Provider Notes (Cosign Needed Addendum)
[facility] EMERGENCY DEPARTMENT Provider Note   CSN: [id number] Arrival date & time: 03/15/22  1654     History  Chief Complaint  Patient presents with   Shortness of Breath    [name] is a 71 y.o. 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  Patient was diagnosed last week with pneumonia by her pulmonologist.  She had a follow-up visit today and they felt she had failed outpatient treatment they recommended she go to the ER.  She states she has been using her nebulizers twice a day with good relief.  She also states that she has been requiring more oxygen than her usual.  Her baseline is 2 L, and she has been requiring 3.   Shortness of Breath Associated symptoms: cough   Associated symptoms: no fever       Home Medications Prior to Admission medications   Medication Sig Start Date End Date Taking? Authorizing Provider  albuterol (PROVENTIL) (2.5 MG/3ML) 0.083% nebulizer solution Take 3 mLs (2.5 mg total) by nebulization every 6 (six) hours as needed for up to 30 doses for wheezing or shortness of breath. 11/14/21  Yes Chesley Mires, MD  albuterol (VENTOLIN HFA) 108 (90 Base) MCG/ACT inhaler INHALE TWO PUFFS INTO THE LUNGS EVERY FOUR HOURS AS NEEDED FOR WHEEZING OR SHORTNESS OF BREATH Patient taking differently: Inhale 2 puffs into the lungs every 4 (four) hours as needed for wheezing or shortness of breath. 01/17/22  Yes Chesley Mires, MD  azithromycin (ZITHROMAX) 250 MG tablet Take 2 tabs on day 1 followed by 1 tab daily for four additional days. 03/09/22  Yes Cobb, Karie Schwalbe, NP  Benzocaine (BOIL-EASE EX) Apply 1 application. topically daily as needed (Boil).   Yes [provider]  carbamide peroxide (DEBROX) 6.5 % OTIC solution Place 5 drops into both ears daily as needed (ears).   Yes [provider]  colestipol (COLESTID)  1 g tablet TAKE 2 TABLETS BY MOUTH ONCE DAILY. DO NOT TAKE WITHIN TWO HOURS OF OTHER MEDS. 03/05/22  Yes Aliene Altes S, PA-C  diazepam (VALIUM) 5 MG tablet Take 5 mg by mouth See admin instructions. Take 5 mg 1 to 2 times as needed for anxiety. 03/13/22  Yes [provider]  diclofenac Sodium (VOLTAREN) 1 % GEL Apply topically as needed.   Yes [provider]  docusate sodium (COLACE) 100 MG capsule Take 1 capsule (100 mg total) by mouth 2 (two) times daily. 12/28/21  Yes Magnant, Charles L, PA-C  doxycycline (VIBRA-TABS) 100 MG tablet Take 1 tablet (100 mg total) by mouth 2 (two) times daily. 03/09/22  Yes Cobb, Karie Schwalbe, NP  empagliflozin (JARDIANCE) 25 MG TABS tablet Take 25 mg by mouth daily.   Yes [provider]  Ferrous Sulfate (IRON) 325 (65 Fe) MG TABS Take 1 tablet by mouth daily.   Yes [provider]  Fluticasone-Umeclidin-Vilant (TRELEGY ELLIPTA) 100-62.5-25 MCG/ACT AEPB Inhale 1 puff into the lungs daily. 03/09/22  Yes Cobb, Karie Schwalbe, NP  hydrocortisone cream 1 % Apply 1 application topically daily as needed for itching.   Yes [provider]  levothyroxine (SYNTHROID) 112 MCG tablet Take 112 mcg by mouth daily before breakfast.   Yes [provider]  metFORMIN (GLUCOPHAGE) 500 MG tablet Take 500 mg by mouth daily with breakfast. 09/14/21  Yes [provider]  Multiple Vitamin (MULTI VITAMIN) TABS  Yes [provider]  neomycin-bacitracin-polymyxin (NEOSPORIN) OINT Apply 1 application topically daily as needed for irritation or wound care.   Yes [provider]  Omega 3 1000 MG CAPS    Yes [provider]  pantoprazole (PROTONIX) 40 MG tablet Take 40 mg by mouth daily. 09/29/19  Yes [provider]  PARoxetine (PAXIL) 20 MG tablet Take 20 mg by mouth daily. 01/12/21  Yes [provider]  rosuvastatin (CRESTOR) 20 MG tablet Take 20 mg by mouth daily as needed. 01/12/21  Yes  [provider]  traZODone (DESYREL) 100 MG tablet Take 100 mg by mouth at bedtime. 01/12/22  Yes [provider]  aspirin 81 MG chewable tablet Chew 1 tablet (81 mg total) by mouth 2 (two) times daily. Patient not taking: Reported on 03/15/2022 12/28/21   Magnant, Charles L, PA-C  lidocaine (LIDODERM) 5 % Place 1 patch onto the skin daily. Remove & Discard patch within 12 hours or as directed by MD Patient not taking: Reported on 03/15/2022 03/09/22   Belenda Cruise, Karie Schwalbe, NP  predniSONE (DELTASONE) 10 MG tablet 4 tabs for 2 days, then 3 tabs for 2 days, 2 tabs for 2 days, then 1 tab for 2 days, then stop Patient not taking: Reported on 03/15/2022 03/09/22   Clayton Bibles, NP      Allergies    Bee venom, Iohexol, Tape, Betadine [povidone iodine], Ciprofloxacin, Latex, Penicillins, and Povidone-iodine    Review of Systems   Review of Systems  Constitutional:  Negative for fever.  Respiratory:  Positive for cough and shortness of breath.        Chest soreness from coughing  All other systems reviewed and are negative.  Physical Exam Updated Vital Signs BP 117/69   Pulse 88   Temp 97.8 F (36.6 C) (Oral)   Resp 17   Ht 5' 6"  (1.676 m)   Wt 56.7 kg   SpO2 94%   BMI 20.18 kg/m  Physical Exam Vitals and nursing note reviewed.  Constitutional:      Appearance: Normal appearance.     Comments: On 3 L nasal cannula  HENT:     Head: Normocephalic and atraumatic.  Eyes:     Conjunctiva/sclera: Conjunctivae normal.  Cardiovascular:     Rate and Rhythm: Normal rate and regular rhythm.  Pulmonary:     Effort: Pulmonary effort is normal. No respiratory distress.     Breath sounds: Wheezing present.     Comments: Diffuse inspiratory and expiratory wheezing in all lung fields Abdominal:     General: There is no distension.     Palpations: Abdomen is soft.     Tenderness: There is no abdominal tenderness.  Skin:    General: Skin is warm and dry.  Neurological:      General: No focal deficit present.     Mental Status: She is alert.    ED Results / Procedures / Treatments   Labs (all labs ordered are listed, but only abnormal results are displayed) Labs Reviewed  CBC - Abnormal; Notable for the following components:      Result Value   RBC 3.27 (*)    Hemoglobin 9.1 (*)    HCT 31.0 (*)    MCHC 29.4 (*)    RDW 16.4 (*)    All other components within normal limits  BASIC METABOLIC PANEL - Abnormal; Notable for the following components:   Glucose, Bld 224 (*)    All other components within normal limits  LACTIC ACID, PLASMA - Abnormal; Notable for the following components:   Lactic Acid, Venous 2.0 (*)    All other components within normal limits  BRAIN NATRIURETIC PEPTIDE - Abnormal; Notable for the following components:   B Natriuretic Peptide 1,607.0 (*)    All other components within normal limits  CULTURE, BLOOD (ROUTINE X 2)  CULTURE, BLOOD (ROUTINE X 2)  LACTIC ACID, PLASMA    EKG EKG Interpretation  Date/Time:  Thursday March 15 2022 17:23:54 EDT Ventricular Rate:  90 PR Interval:  140 QRS Duration: 142 QT Interval:  398 QTC Calculation: 486 R Axis:   -22 Text Interpretation: Sinus rhythm with Fusion complexes Left bundle branch block Abnormal ECG When compared with ECG of 28-Feb-2021 10:51, Fusion complexes are now Present Left bundle branch block is now Present These are new findings Otherwise no significant change Confirmed by Daleen Bo [phone]) on 03/15/2022 6:05:17 PM  Radiology DG Chest 2 View  Result Date: 03/15/2022 CLINICAL DATA:  Short of breath, history of pneumonia EXAM: CHEST - 2 VIEW COMPARISON:  03/09/2022 FINDINGS: Frontal and lateral views of the chest demonstrate a stable cardiac silhouette. There is continued consolidation within the right upper and right lower lobes, with stable right pleural effusion. Stable interstitial and ground-glass prominence throughout the remaining portions of the lungs. No  pneumothorax. No acute bony abnormalities. IMPRESSION: 1. Continued right-sided pneumonia and parapneumonic effusion. 2. Chronic background scarring and fibrosis. Electronically Signed   By: Randa Ngo M.D.   On: 03/15/2022 18:23    Procedures .Critical Care Performed by: Kateri Plummer, PA-C Authorized by: Kateri Plummer, PA-C   Critical care provider statement:    Critical care time (minutes):  30   Critical care time was exclusive of:  Separately billable procedures and treating other patients   Critical care was necessary to treat or prevent imminent or life-threatening deterioration of the following conditions:  Sepsis and respiratory failure   Critical care was time spent personally by me on the following activities:  Development of treatment plan with patient or surrogate, discussions with consultants, evaluation of patient's response to treatment, examination of patient, ordering and review of laboratory studies, ordering and review of radiographic studies, ordering and performing treatments and interventions, pulse oximetry, re-evaluation of patient's condition and review of old charts    Medications Ordered in ED Medications  methylPREDNISolone sodium succinate (SOLU-MEDROL) 125 mg/2 mL injection 125 mg (has no administration in time range)  vancomycin (VANCOREADY) IVPB 1250 mg/250 mL (has no administration in time range)  ceFEPIme (MAXIPIME) 2 g in sodium chloride 0.9 % 100 mL IVPB (has no administration in time range)    ED Course/ Medical Decision Making/ A&P                           Medical Decision Making Amount and/or Complexity of Data Reviewed Labs: ordered. Radiology: ordered.  Risk Prescription drug management. Decision regarding hospitalization.  This patient is a 71 y.o. female who presents to the ED for concern of shortness of breath recently diagnosed with pneumonia.  Past Medical History / Co-morbidities / Social History: COPD, small cell lung  cancer with history of brain metastasis s/p radiation, hypothyroidism, insulin dependent diabetes, CKD, CHF with EF 55-60% on last echocardiogram (12/2019)  Additional history: Chart reviewed. Pertinent results include: Patient was diagnosed on 5/26 with pneumonia by her pulmonologist.  They gave her prednisone, and discharged her with doxycycline and azithromycin and nebulizers.  She had a follow-up visit today and they felt she had failed outpatient treatment.  They recommended she go to the ER for IV antibiotics and steroids.  Her imaging had also showed a parapneumonic effusion, and they have concerned it could be malignant if it does not improve after treating pneumonia.  Physical Exam: Physical exam performed. The pertinent findings include: On 3 L nasal cannula, and 100% saturation.  Inspiratory and expiratory wheezing in all lung fields to auscultation.  Lab Tests: I ordered, and personally interpreted labs.  The pertinent results include: No leukocytosis, hemoglobin appears stable compared to prior.  Glucose 224, otherwise electrolytes within normal limits.  Lactic acid 2.0.  BNP 1607.   Imaging Studies: I ordered imaging studies including chest x-ray. I independently visualized and interpreted imaging which showed continued right-sided pneumonia and parapneumonic effusion. I agree with the radiologist interpretation.   Cardiac Monitoring:  The patient was maintained on a cardiac monitor.  My attending physician Dr. Eulis Foster viewed and interpreted the cardiac monitored which showed an underlying rhythm of: sinus rhythm with left bundle branch block. I agree with this interpretation.   Medications: I ordered medication including empiric antibiotics  for pneumonia. I have reviewed the patients home medicines and have made adjustments as needed.  Consultations Obtained: I requested consultation with the hospitalist Dr Josephine Cables,  and discussed lab and imaging findings as well as pertinent plan -  they recommend: medical admission for CAP treatment.    Disposition: After consideration of the diagnostic results and the patients response to treatment, I feel that patient is requiring admission.   I discussed this case with my attending physician Dr. Eulis Foster who cosigned this note including patient's presenting symptoms, physical exam, and planned diagnostics and interventions. Attending physician stated agreement with plan or made changes to plan which were implemented.     Final Clinical Impression(s) / ED Diagnoses Final diagnoses:  Community acquired pneumonia of right lung, unspecified part of lung    Rx / DC Orders ED Discharge Orders     None      Portions of this report may have been transcribed using voice recognition software. Every effort was made to ensure accuracy; however, inadvertent computerized transcription errors may be present.   Estill Cotta 03/15/22 2214    Daleen Bo, MD 03/16/22 [phone]

## 2022-03-15 NOTE — Patient Instructions (Signed)
Please seek further evaluation at the emergency department   Contact us once you are discharged to schedule a follow up.  I hope you feel better soon!

## 2022-03-15 NOTE — ED Triage Notes (Signed)
Pt sent from 70 office for evaluation of pneumonia, diagnosed last week, x-ray today showed no improvement, pt currently SOB, chronically on O2.

## 2022-03-15 NOTE — H&P (Signed)
History and Physical    Patient: [name] WHQ:[id number] DOB: [birth year] DOA: 03/15/2022 DOS: the patient was seen and examined on 03/15/2022 PCP: [name], MD  Patient coming from: Home  Chief Complaint:  Chief Complaint  Patient presents with   Shortness of Breath   HPI: [name] is a 71 y.o. 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 Patient was diagnosed with CAP and AECOPD last week and was started on doxycycline and azithromycin (due to being allergic to Cipro and Augmentin) as well as prednisone taper, she went for a follow-up today and reported that symptoms are worse with more fatigue and worsening shortness of breath with minimal activity.  She continued to have productive cough with yellow phlegm and continues to have chest discomfort on coughing and inspiration.  O2 sat at home has been in the 80s especially in the mornings despite increasing supplemental oxygen to 3.5L from 2 LPM.  She has been compliant with medication and only have 1 day left for doxycycline, azithromycin was already completed.  Patient continues on Trelegy and has had to use albuterol inhaler several times in a day.  She endorsed poor appetite but denies chest pain, bloody sputum, lower extremity swelling, night sweats, nausea, vomiting, abdominal pain.  ED Course:  In the emergency department, she was intermittently tachypneic, but other vital signs were within normal range, O2 sat was 92-94% on supplemental oxygen at 3 LPM.  Work-up in the ED showed normocytic anemia, normal BMP except for hyperglycemia, lactic acid  2.0 and BNP 1,607.  Blood culture was pending. Chest x-ray showed 1. Continued right-sided pneumonia and parapneumonic effusion. 2. Chronic background scarring and fibrosis. She was started on IV vancomycin and Zosyn.  IV Solu-Medrol 125 mg x 1 was given.  Hospitalist was asked to admit patient for further evaluation and management.    Review of Systems: Review of systems as noted in the HPI. All other systems reviewed and are negative.   Past Medical History:  Diagnosis Date   Acute on chronic diastolic CHF (congestive heart failure) ([place]) 02/28/2021   Allergic rhinitis    Amputation of hand, right ([place]) 1984   Anemia    Anxiety    ASCVD (arteriosclerotic cardiovascular disease)    MI in 96 requiring BMS CX; DES to M1 in 2000;normal coronary angiography in 2004   Cholelithiasis    COPD (chronic obstructive pulmonary disease) (HCC)    DDD (degenerative disc disease), lumbar    Depression    Diabetes mellitus    Type II   Diarrhea    DVT (deep venous thrombosis) ([place])    patient said no   GERD (gastroesophageal reflux disease)    Headache    migraine   History of kidney stones    2006   History of radiation therapy 06/28/2020-08/08/2020   right Lung; Dr. [name]   History of radiation therapy 03/22/2021   brain 03/02/2021-03/22/2021   Dr [name]   Hyperlipidemia    Hypertension    Hypotension    Hypothyroidism    IBS (irritable bowel syndrome)    Low back pain    Myocardial infarction [facility]) 01/1995  Nephrolithiasis 2006   stone extraction    Panic attacks    Peripheral neuropathy    Peripheral vascular disease (HCC)    legs   Pneumonia 12/22/2019   Sciatic pain    right   Small cell lung cancer (HCC)    Tobacco abuse    Tremor    Weakness of extremity    left hand weakness   Past Surgical History:  Procedure Laterality Date   AGILE CAPSULE N/A 03/07/2020   Procedure: AGILE CAPSULE;  Surgeon: Daneil Dolin, MD;  [facility];  Service:  Endoscopy;  Laterality: N/A;  7:30am   BACK SURGERY     fusion   BRONCHIAL NEEDLE ASPIRATION BIOPSY N/A 05/20/2020   Procedure: BRONCHIAL NEEDLE ASPIRATION BIOPSIES;  Surgeon: Collene Gobble, MD;  Location: [facility];  Service: Pulmonary;  Laterality: N/A;   CHOLECYSTECTOMY     COLONOSCOPY  01/2009   BBC:[id number] rectum/repeat in 5 yrs   COLONOSCOPY N/A 04/29/2014   Dr.Rourk- attempted/incomplete colonoscopy. inadequate prep   COLONOSCOPY N/A 05/27/2014   QXI:[id number] coli. Colonic polyps-removed as described above.Status post segmental biopsy. single tubular adenoma and random colon bx neg   COLONOSCOPY WITH PROPOFOL N/A 12/23/2019   Surgeon: Ronnette Juniper, MD; single polyp removed from colon (path-lipoma), single non-bleeding colonic angioectasia. Treated with argon plasma coagulation (APC). Friable (with contact bleeding) mucosa in the transverse colon. Clip (MR conditional) was placed.   DILATION AND CURETTAGE OF UTERUS  1974   ESOPHAGOGASTRODUODENOSCOPY  05/2010   Dr. Tessie Fass, erosion. 83F dilation   ESOPHAGOGASTRODUODENOSCOPY N/A 04/29/2014   Dr.Rourk- normal esophagus s/p passage of maloney dilator. small hiatal hernia- bx= chronic inflammation.   ESOPHAGOGASTRODUODENOSCOPY (EGD) WITH PROPOFOL N/A 12/23/2019   Surgeon: Ronnette Juniper, MD;  mild gastritis (no specimen collected)   GIVENS CAPSULE STUDY N/A 03/22/2020   Surgeon: Daneil Dolin, MD; few gastric erosions and small AVM. Few scattered small bowel erosions. Couple of small bowel AVMs.   HEMOSTASIS CLIP PLACEMENT  12/23/2019   Procedure: HEMOSTASIS CLIP PLACEMENT;  Surgeon: Ronnette Juniper, MD;  Location: [place];  Service: Gastroenterology;;   HOT HEMOSTASIS N/A 12/23/2019   Procedure: HOT HEMOSTASIS (ARGON PLASMA COAGULATION/BICAP);  Surgeon: Ronnette Juniper, MD;  Location: [place];  Service: Gastroenterology;  Laterality: N/A;   KNEE SURGERY Left    LEFT HEART CATH AND CORONARY ANGIOGRAPHY N/A 12/24/2019   Procedure:  LEFT HEART CATH AND CORONARY ANGIOGRAPHY;  Surgeon: Nigel Mormon, MD;  Location: [place] [facility];  Service: Cardiovascular;  Laterality: N/A;   MALONEY DILATION N/A 04/29/2014   Procedure: Venia Minks DILATION;  Surgeon: Daneil Dolin, MD;  [facility];  Service: Endoscopy;  Laterality: N/A;   ORIF PATELLA Right 12/26/2021   Procedure: RIGHT OPEN REDUCTION INTERNAL (ORIF) FIXATION PATELLA;  Surgeon: Meredith Pel, MD;  Location: [place];  Service: Orthopedics;  Laterality: Right;   PARTIAL HYSTERECTOMY  1978   POLYPECTOMY  12/23/2019   Procedure: POLYPECTOMY;  Surgeon: Ronnette Juniper, MD;  Location: [place];  Service: Gastroenterology;;   SHOULDER SURGERY Left    Left shoulder for RTC;left arm surgery '98/left hand surgery 2001   TOTAL ABDOMINAL HYSTERECTOMY W/ BILATERAL SALPINGOOPHORECTOMY  0349   UMBILICAL HERNIA REPAIR  2008   VIDEO BRONCHOSCOPY WITH ENDOBRONCHIAL ULTRASOUND N/A 05/20/2020   Procedure: VIDEO BRONCHOSCOPY WITH ENDOBRONCHIAL ULTRASOUND;  Surgeon: Collene Gobble, MD;  Location: [facility];  Service: Pulmonary;  Laterality: N/A;    Social History:  reports that she has been  smoking cigarettes. She has a 10.00 pack-year smoking history. She has never used smokeless tobacco. She reports that she does not drink alcohol and does not use drugs.   Allergies  Allergen Reactions   Bee Venom Shortness Of Breath and Swelling   Iohexol Shortness Of Breath and Other (See Comments)    CHEST TIGHTNESS and BREATHING PROBLEMS- NEEDS PRE-MEDS 13 hours beforehand-  premeds given 06/08/2020 without incident    Tape Other (See Comments)    Tears the skin!!   Betadine [Povidone Iodine] Rash   Ciprofloxacin Rash   Latex Rash and Other (See Comments)    NO latex gloves!!    Penicillins Swelling    Did it involve swelling of the face/tongue/throat, SOB, or low BP? Yes Did it involve sudden or severe rash/hives, skin peeling, or any reaction on the inside of your  mouth or nose? No Did you need to seek medical attention at a hospital or doctor's office? Yes When did it last happen?      1996 If all above answers are "NO", may proceed with cephalosporin use.   Povidone-Iodine Rash    Family History  Problem Relation Age of Onset   Depression Mother    Bipolar disorder Mother    Dementia Mother    Pulmonary fibrosis Mother    Alcohol abuse Father    Aneurysm Father        deceased age 100, brain   Colon cancer Paternal Grandfather        age greater than 74   Healthy Child      Prior to Admission medications   Medication Sig Start Date End Date Taking? Authorizing Provider  albuterol (PROVENTIL) (2.5 MG/3ML) 0.083% nebulizer solution Take 3 mLs (2.5 mg total) by nebulization every 6 (six) hours as needed for up to 30 doses for wheezing or shortness of breath. 11/14/21  Yes Chesley Mires, MD  albuterol (VENTOLIN HFA) 108 (90 Base) MCG/ACT inhaler INHALE TWO PUFFS INTO THE LUNGS EVERY FOUR HOURS AS NEEDED FOR WHEEZING OR SHORTNESS OF BREATH Patient taking differently: Inhale 2 puffs into the lungs every 4 (four) hours as needed for wheezing or shortness of breath. 01/17/22  Yes Chesley Mires, MD  azithromycin (ZITHROMAX) 250 MG tablet Take 2 tabs on day 1 followed by 1 tab daily for four additional days. 03/09/22  Yes Cobb, Karie Schwalbe, NP  Benzocaine (BOIL-EASE EX) Apply 1 application. topically daily as needed (Boil).   Yes [provider]  carbamide peroxide (DEBROX) 6.5 % OTIC solution Place 5 drops into both ears daily as needed (ears).   Yes [provider]  colestipol (COLESTID) 1 g tablet TAKE 2 TABLETS BY MOUTH ONCE DAILY. DO NOT TAKE WITHIN TWO HOURS OF OTHER MEDS. 03/05/22  Yes Aliene Altes S, PA-C  diazepam (VALIUM) 5 MG tablet Take 5 mg by mouth See admin instructions. Take 5 mg 1 to 2 times as needed for anxiety. 03/13/22  Yes [provider]  diclofenac Sodium (VOLTAREN) 1 % GEL Apply topically as needed.   Yes  [provider]  docusate sodium (COLACE) 100 MG capsule Take 1 capsule (100 mg total) by mouth 2 (two) times daily. 12/28/21  Yes Magnant, Charles L, PA-C  doxycycline (VIBRA-TABS) 100 MG tablet Take 1 tablet (100 mg total) by mouth 2 (two) times daily. 03/09/22  Yes Cobb, Karie Schwalbe, NP  empagliflozin (JARDIANCE) 25 MG TABS tablet Take 25 mg by mouth daily.   Yes [provider]  Ferrous Sulfate (IRON)  325 (65 Fe) MG TABS Take 1 tablet by mouth daily.   Yes [provider]  Fluticasone-Umeclidin-Vilant (TRELEGY ELLIPTA) 100-62.5-25 MCG/ACT AEPB Inhale 1 puff into the lungs daily. 03/09/22  Yes Cobb, Karie Schwalbe, NP  hydrocortisone cream 1 % Apply 1 application topically daily as needed for itching.   Yes [provider]  levothyroxine (SYNTHROID) 112 MCG tablet Take 112 mcg by mouth daily before breakfast.   Yes [provider]  metFORMIN (GLUCOPHAGE) 500 MG tablet Take 500 mg by mouth daily with breakfast. 09/14/21  Yes [provider]  Multiple Vitamin (MULTI VITAMIN) TABS    Yes [provider]  neomycin-bacitracin-polymyxin (NEOSPORIN) OINT Apply 1 application topically daily as needed for irritation or wound care.   Yes [provider]  Omega 3 1000 MG CAPS    Yes [provider]  pantoprazole (PROTONIX) 40 MG tablet Take 40 mg by mouth daily. 09/29/19  Yes [provider]  PARoxetine (PAXIL) 20 MG tablet Take 20 mg by mouth daily. 01/12/21  Yes [provider]  rosuvastatin (CRESTOR) 20 MG tablet Take 20 mg by mouth daily as needed. 01/12/21  Yes [provider]  traZODone (DESYREL) 100 MG tablet Take 100 mg by mouth at bedtime. 01/12/22  Yes [provider]  aspirin 81 MG chewable tablet Chew 1 tablet (81 mg total) by mouth 2 (two) times daily. Patient not taking: Reported on 03/15/2022 12/28/21   Magnant, Charles L, PA-C  lidocaine (LIDODERM) 5 % Place 1 patch onto the skin daily.  Remove & Discard patch within 12 hours or as directed by MD Patient not taking: Reported on 03/15/2022 03/09/22   Cobb, Karie Schwalbe, NP  predniSONE (DELTASONE) 10 MG tablet 4 tabs for 2 days, then 3 tabs for 2 days, 2 tabs for 2 days, then 1 tab for 2 days, then stop Patient not taking: Reported on 03/15/2022 03/09/22   Clayton Bibles, NP    Physical Exam: BP 126/82   Pulse 96   Temp 98.2 F (36.8 C) (Oral)   Resp 20   Ht 5' 6"  (1.676 m)   Wt 56.7 kg   SpO2 99%   BMI 20.18 kg/m   General: 71 y.o. year-old female laying in bed and in no acute distress.  Alert and oriented x3. HEENT: NCAT, EOMI Neck: Supple, trachea medial Cardiovascular: Regular rate and rhythm with no rubs or gallops.  No thyromegaly or JVD noted.  No lower extremity edema. 2/4 pulses in all 4 extremities. Respiratory: Tachypnea.  Diffuse expiratory wheezes on auscultation  Abdomen: Soft, nontender nondistended with normal bowel sounds x4 quadrants. Muskuloskeletal: Amputated right hand.  No cyanosis, clubbing or edema noted bilaterally Neuro: CN II-XII intact, strength 5/5 x 4, sensation, reflexes intact Skin: No ulcerative lesions noted or rashes Psychiatry: Judgement and insight appear normal. Mood is appropriate for condition and setting          Labs on Admission:  Basic Metabolic Panel: Recent Labs  Lab 03/15/22 1754  NA 135  K 4.2  CL 100  CO2 28  GLUCOSE 224*  BUN 21  CREATININE 0.92  CALCIUM 9.3   Liver Function Tests: No results for input(s): AST, ALT, ALKPHOS, BILITOT, PROT, ALBUMIN in the last 168 hours. No results for input(s): LIPASE, AMYLASE in the last 168 hours. No results for input(s): AMMONIA in the last 168 hours. CBC: Recent Labs  Lab 03/15/22 1754  WBC 4.0  HGB 9.1*  HCT 31.0*  MCV 94.8  PLT 201   Cardiac Enzymes: No results for input(s): CKTOTAL, CKMB, CKMBINDEX, TROPONINI in the last 168 hours.  BNP (last 3 results) Recent Labs    03/15/22 1757  BNP 1,607.0*     ProBNP (last 3 results) No results for input(s): PROBNP in the last 8760 hours.  CBG: No results for input(s): GLUCAP in the last 168 hours.  Radiological Exams on Admission: DG Chest 2 View  Result Date: 03/15/2022 CLINICAL DATA:  Short of breath, history of pneumonia EXAM: CHEST - 2 VIEW COMPARISON:  03/09/2022 FINDINGS: Frontal and lateral views of the chest demonstrate a stable cardiac silhouette. There is continued consolidation within the right upper and right lower lobes, with stable right pleural effusion. Stable interstitial and ground-glass prominence throughout the remaining portions of the lungs. No pneumothorax. No acute bony abnormalities. IMPRESSION: 1. Continued right-sided pneumonia and parapneumonic effusion. 2. Chronic background scarring and fibrosis. Electronically Signed   By: Randa Ngo M.D.   On: 03/15/2022 18:23    EKG: I independently viewed the EKG done and my findings are as followed: Sinus rhythm with fusion complexes at a rate of 90 bpm.  LBBB  Assessment/Plan Present on Admission:  CAP (community acquired pneumonia)  COPD with acute exacerbation ([place])  TOBACCO ABUSE  Mixed hyperlipidemia  IDA (iron deficiency anemia)  GERD  Coronary artery disease/Prior Stents 1997 and 2000/RCA occlusion 12/2019  Principal Problem:   CAP (community acquired pneumonia) Active Problems:   Acquired hypothyroidism   Mixed hyperlipidemia   TOBACCO ABUSE   Coronary artery disease/Prior Stents 1997 and 2000/RCA occlusion 12/2019   COPD with acute exacerbation (HCC)   GERD   IDA (iron deficiency anemia)   Acute on chronic respiratory failure with hypoxia (HCC)   Elevated brain natriuretic peptide (BNP) level   Type 2 diabetes mellitus with hyperglycemia (HCC)   History of lung cancer  Acute on chronic respiratory failure secondary to CAP POA with superimposed COPD exacerbation status post outpatient treatment failure Chest x-ray showed continued right-sided  pneumonia and parapneumonic effusion Patient was treated with doxycycline and Azithromycin as an outpatient without improvement Patient was started on vancomycin and cefepime, we shall continue same at this time with plan to de-escalate/discontinue based on blood culture, sputum culture, urine Legionella, strep pneumo and procalcitonin Continue Tylenol as needed Continue Mucinex, incentive spirometry, flutter valve  Continue duo nebs,  Solu-Medrol. Continue Protonix to prevent steroid-induced ulcer Continue supplemental oxygen to maintain O2 sat > 94% with plan to wean patient down to baseline home oxygen requirement.  Lactic acidosis possibly secondary to above Lactic acid 2.0 > 2.8, continue to trend lactic acid  Elevated BNP rule out acute on chronic HFpEF BNP 1,607, patient does not appear to be fluid overloaded Continue total input/output, daily weights and fluid restriction Continue Cardiac diet  Echocardiogram done on 12/21/2019 showed LVEF of 55 to 60%.  No RWMA.  G2 DD.  Echocardiogram will be done in the morning   History of lung cancer with brain mets s/p radiation Chest x-ray showed parapneumonic effusion Continue to monitor and consider malignant effusion with consideration for thoracentesis if pneumonia does not resolve despite IV antibiotics  Type 2 diabetes mellitus with hyperglycemia CBG 224, continue ISS and hypoglycemic protocol  CAD s/p stent placement Continue aspirin, Crestor  Mixed hyperlipidemia Continue Crestor  GERD Continue Protonix  Hypothyroidism Continue Synthroid.  Iron deficiency anemia Continue ferrous sulfate  Tobacco abuse Patient states that she stopped smoking about 2 days ago   DVT prophylaxis:  Lovenox  Advance Care Planning:   Code Status: Full Code   Consults: None  Family Communication: None at bedside  Severity of Illness: The appropriate patient status for this patient is INPATIENT. Inpatient status is judged to be  reasonable and necessary in order to provide the required intensity of service to ensure the patient's safety. The patient's presenting symptoms, physical exam findings, and initial radiographic and laboratory data in the context of their chronic comorbidities is felt to place them at high risk for further clinical deterioration. Furthermore, it is not anticipated that the patient will be medically stable for discharge from the hospital within 2 midnights of admission.   * I certify that at the point of admission it is my clinical judgment that the patient will require inpatient hospital care spanning beyond 2 midnights from the point of admission due to high intensity of service, high risk for further deterioration and high frequency of surveillance required.*  Author: Bernadette Hoit, DO 03/15/2022 9:25 PM  For on call review [email or web address].

## 2022-03-15 NOTE — Progress Notes (Signed)
Pharmacy Antibiotic Note  [name] is a 71 y.o. female admitted on 03/15/2022 with pneumonia. Pharmacy has been consulted for vancomycin dosing.  Plan: Vancomycin 1250 mg IV loading dose followed by 750 mg IV q24 hr. Goal AUC 400-550 Calc AUC 436. Will follow levels, renal fxn, LOT.   Height: 5' 6"  (167.6 cm) Weight: 56.7 kg (125 lb) IBW/kg (Calculated) : 59.3  Temp (24hrs), Avg:97.9 F (36.6 C), Min:97.8 F (36.6 C), Max:98 F (36.7 C)  Recent Labs  Lab 03/15/22 1754 03/15/22 1942  WBC 4.0  --   CREATININE 0.92  --   LATICACIDVEN 2.0* 2.8*    Estimated Creatinine Clearance: 50.9 mL/min (by C-G formula based on SCr of 0.92 mg/dL).    Allergies  Allergen Reactions   Bee Venom Shortness Of Breath and Swelling   Iohexol Shortness Of Breath and Other (See Comments)    CHEST TIGHTNESS and BREATHING PROBLEMS- NEEDS PRE-MEDS 13 hours beforehand-  premeds given 06/08/2020 without incident    Tape Other (See Comments)    Tears the skin!!   Betadine [Povidone Iodine] Rash   Ciprofloxacin Rash   Latex Rash and Other (See Comments)    NO latex gloves!!    Penicillins Swelling    Did it involve swelling of the face/tongue/throat, SOB, or low BP? Yes Did it involve sudden or severe rash/hives, skin peeling, or any reaction on the inside of your mouth or nose? No Did you need to seek medical attention at a hospital or doctor's office? Yes When did it last happen?      1996 If all above answers are "NO", may proceed with cephalosporin use.   Povidone-Iodine Rash    Antimicrobials this admission: Cefepime  6/1 >>  vancomycin 6/1 >>    Microbiology results: 6/1 BCx: pending 6/1 Sputum: pending   Thank you for allowing pharmacy to be a part of this patient's care.  Blenda Nicely 03/15/2022 8:52 PM

## 2022-03-15 NOTE — ED Provider Notes (Signed)
  Face-to-face evaluation   History: Patient presenting for evaluation of cough, malaise, shortness of breath and hypoxia despite using home oxygen.  She saw her pulmonologist provider today who directed her to the hospital to be admitted for IV antibiotics, IV steroids and further evaluation.  She has a history of small cell lung cancer right side, currently not being treated.  She has a follow-up with oncology later this month  Physical exam: Elderly alert and cooperative.  Coughing persistently.  No overt shortness of breath.  No respiratory distress.  She is lucid.  MDM: Evaluation for  Chief Complaint  Patient presents with   Shortness of Breath     Patient presenting for shortness of breath.  EKG with new onset left bundle branch based on earlier tracing.  Chest x-ray concerning for recurrent cancer with secondary infection.  Some concern for CHF too.  Last cardiac echo did not show decreased ejection fraction.  Patient has IV contrast allergy and will need to be pretreated before CT imaging to evaluate for lung cancer.  In the meantime we will treat for pneumonia and arrange for admission.  Medical screening examination/treatment/procedure(s) were conducted as a shared visit with non-physician practitioner(s) and myself.  I personally evaluated the patient during the encounter    Daleen Bo, MD 03/16/22 [phone]

## 2022-03-15 NOTE — Progress Notes (Signed)
Actual weight is 127.3lbs

## 2022-03-15 NOTE — Progress Notes (Signed)
@Patient  ID: [name], female    DOB: 1951-02-05, 71 y.o.   MRN: [id number]  Chief Complaint  Patient presents with   Follow-up    Pt is here for follow up with her COPD. Pt states that she is doing worse since that visit. Pt was given doxy and azithromycin and prednisone last visit. Pt has had to use rescue inhaler 5 times a day now. Oxygen is bad in the mornings being in the low 80s.     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  She is a patient of Dr. [name] and last seen in office on 03/09/2022.  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 [name] and Dr. [name].  Past medical history significant for CAD s/p cardiac stents, IDA, hypertension, NSTEMI, CHF, allergic rhinitis, GERD, hypothyroid, peripheral neuropathy, chronic pain, CKD, HLD, anxiety, depression, history of EtOH abuse.  TEST/EVENTS:  12/19/2019 CT chest: Enlarged bilateral hilar and mediastinal lymph nodes up to 18 mm, small bilateral pleural effusions, diffuse interstitial and intralobular septal prominence, 4.6 x 4 cm area of consolidation right middle lobe, cirrhosis with small amount of ascites 12/21/2019 echocardiogram: EF 55 to 60%, G2 DD, mild LA dilation, mild/moderate MR 02/17/2020 A1 AT 131 05/12/2020 PFTs: FEV1 2.2 (87%), FEV1% 77, DLCO 58%.  Unable to do lung volume testing 09/03/2020 CT chest: Right middle lobe mass now 2.3 x 3.5 cm, subpleural reticulation/fibrosis at lung bases 03/09/2022 CXR 2 view: There is a consolidation at the paramediastinal right midlung zone which is new and likely pneumonia.  There is also a small right pleural effusion, likely parapneumonic.  Prominent reticulonodular interstitial changes without significant interval change.  12/13/2021: OV with Dr. [name].  CT chest from January showed decrease size of right middle  lobe lesion, changes of emphysema and ILD.  Stable on 2 lpm supplemental O2.  Breathing stable and no acute respiratory symptoms.  Previously unable to complete PFTs.  Advised to continue Trelegy and as needed albuterol.  She does have probable UIP on CT scan but would not be a candidate for antifibrotic therapy given history of liver disease.  03/09/2022: OV with Abrey Bradway NP due to increased shortness of breath and pain when she takes a big deep breath in.  On 5/17, she slid off her couch and landed on her right side; states she did not fall hard and did not experience any pain with the fall when she landed; no visible injury besides scraping her right elbow.  She feels like she has had increased shortness of breath and pain with inspiration since then.  She is also started developing more productive cough with purulent sputum.  She denies any fevers, night sweats, hemoptysis, lower extremity swelling.  They have increased her oxygen to 3.5 lpm at home and 3 LPM POC due to her shortness of breath however her sats have always been in the 90s.  She continues on Trelegy.  Has been using her albuterol a few times a day.  They also note that she has had some increased balance issues over the last few months.  She has also fallen more.  She did have previous mets to the brain which were radiated and last scan was clear.  She has follow-up scheduled next month with Dr. [name] and Dr. [name]. Recommended she notify neurologist for further  eval. New consolidation at paramediastinal right midlung zone, consistent with pneumonia based on symptoms.  Nontoxic and in no acute distress.  Vital signs are stable.  She is allergic to Augmentin and Cipro.  We will treat with doxycycline and azithromycin.  Advised to use nebs at least twice a day.  Pred taper for AECOPD. Close follow-up and strict ED precautions.  03/15/2022: Today - follow up Patient presents today with her partner for follow up after being found to have pneumonia of  right midlung and AECOPD. She was treated with doxy and azithromycin due to her allergies to augmentin and cipro, and placed on prednisone taper. Today, she reports feeling worse. She is more fatigued and is getting short of breath with very minimal activity. Her cough is unchanged, with purulent sputum still, and she continues to have chest discomfort with coughing and inspiration. They note that her oxygen levels have been in the 80's in the mornings, despite wearing her oxygen at 3.5 lpm during the night, which is increased from her 2 lpm baseline. She has not checked her temperature but has had intermittent chills. Her appetite is poor. She denies hemoptysis, night sweats, orthopnea, lower extremity swelling, chest pain. She has one day left of her doxy and completed her azithromycin. She continues on Trelegy and has been using her albuterol inhaler multiple times a day.   Allergies  Allergen Reactions   Bee Venom Shortness Of Breath and Swelling   Iohexol Shortness Of Breath and Other (See Comments)    CHEST TIGHTNESS and BREATHING PROBLEMS- NEEDS PRE-MEDS 13 hours beforehand-  premeds given 06/08/2020 without incident    Tape Other (See Comments)    Tears the skin!!   Betadine [Povidone Iodine] Rash   Ciprofloxacin Rash   Latex Rash and Other (See Comments)    NO latex gloves!!    Penicillins Swelling    Did it involve swelling of the face/tongue/throat, SOB, or low BP? Yes Did it involve sudden or severe rash/hives, skin peeling, or any reaction on the inside of your mouth or nose? No Did you need to seek medical attention at a hospital or doctor's office? Yes When did it last happen?      1996 If all above answers are "NO", may proceed with cephalosporin use.   Povidone-Iodine Rash    Immunization History  Administered Date(s) Administered   Fluad Quad(high Dose 65+) 07/20/2020   H1N1 08/04/2008   Influenza Whole 07/18/2006, 07/15/2008   Influenza, High Dose Seasonal PF 06/19/2018    Influenza,inj,Quad PF,6+ Mos 07/06/2019   Moderna Sars-Covid-2 Vaccination 12/15/2019, 01/12/2020, 08/03/2020   PFIZER(Purple Top)SARS-COV-2 Vaccination 08/03/2020   Pneumococcal Polysaccharide-23 07/28/2006   Td 07/06/2012   Tdap 02/07/2015    Past Medical History:  Diagnosis Date   Acute on chronic diastolic CHF (congestive heart failure) ([place]) 02/28/2021   Allergic rhinitis    Amputation of hand, right ([place]) 1984   Anemia    Anxiety    ASCVD (arteriosclerotic cardiovascular disease)    MI in 96 requiring BMS CX; DES to M1 in 2000;normal coronary angiography in 2004   Cholelithiasis    COPD (chronic obstructive pulmonary disease) (HCC)    DDD (degenerative disc disease), lumbar    Depression    Diabetes mellitus    Type II   Diarrhea    DVT (deep venous thrombosis) ([place])    patient said no   GERD (gastroesophageal reflux disease)    Headache    migraine   History of kidney stones  2006   History of radiation therapy 06/28/2020-08/08/2020   right Lung; Dr. [name]   History of radiation therapy 03/22/2021   brain 03/02/2021-03/22/2021   Dr [name]   Hyperlipidemia    Hypertension    Hypotension    Hypothyroidism    IBS (irritable bowel syndrome)    Low back pain    Myocardial infarction [facility]) 01/1995   Nephrolithiasis 2006   stone extraction    Panic attacks    Peripheral neuropathy    Peripheral vascular disease (HCC)    legs   Pneumonia 12/22/2019   Sciatic pain    right   Small cell lung cancer ([place])    Tobacco abuse    Tremor    Weakness of extremity    left hand weakness    Tobacco History: Social History   Tobacco Use  Smoking Status Every Day   Packs/day: 0.25   Years: 40.00   Pack years: 10.00   Types: Cigarettes   Last attempt to quit: 08/30/2017   Years since quitting: 4.5  Smokeless Tobacco Never   Ready to quit: Not Answered Counseling given: Not Answered   Outpatient Medications Prior to Visit  Medication Sig  Dispense Refill   albuterol (PROVENTIL) (2.5 MG/3ML) 0.083% nebulizer solution Take 3 mLs (2.5 mg total) by nebulization every 6 (six) hours as needed for up to 30 doses for wheezing or shortness of breath. 90 mL 3   albuterol (VENTOLIN HFA) 108 (90 Base) MCG/ACT inhaler INHALE TWO PUFFS INTO THE LUNGS EVERY FOUR HOURS AS NEEDED FOR WHEEZING OR SHORTNESS OF BREATH 18 g 5   aspirin 81 MG chewable tablet Chew 1 tablet (81 mg total) by mouth 2 (two) times daily. 60 tablet 0   azithromycin (ZITHROMAX) 250 MG tablet Take 2 tabs on day 1 followed by 1 tab daily for four additional days. 6 tablet 0   Benzocaine (BOIL-EASE EX) Apply 1 application. topically daily as needed (Boil).     carbamide peroxide (DEBROX) 6.5 % OTIC solution Place 5 drops into both ears daily as needed (ears).     colestipol (COLESTID) 1 g tablet TAKE 2 TABLETS BY MOUTH ONCE DAILY. DO NOT TAKE WITHIN TWO HOURS OF OTHER MEDS. 180 tablet 1   diclofenac Sodium (VOLTAREN) 1 % GEL Apply topically as needed.     docusate sodium (COLACE) 100 MG capsule Take 1 capsule (100 mg total) by mouth 2 (two) times daily. 10 capsule 0   doxycycline (VIBRA-TABS) 100 MG tablet Take 1 tablet (100 mg total) by mouth 2 (two) times daily. 14 tablet 0   empagliflozin (JARDIANCE) 25 MG TABS tablet Take 25 mg by mouth daily.     Ferrous Sulfate (IRON) 325 (65 Fe) MG TABS Take 1 tablet by mouth daily.     Fluticasone-Umeclidin-Vilant (TRELEGY ELLIPTA) 100-62.5-25 MCG/ACT AEPB Inhale 1 puff into the lungs daily. 60 each 0   Fluticasone-Umeclidin-Vilant (TRELEGY ELLIPTA) 100-62.5-25 MCG/ACT AEPB Inhale 1 puff into the lungs daily. 60 each 0   hydrocortisone cream 1 % Apply 1 application topically daily as needed for itching.     levothyroxine (SYNTHROID) 112 MCG tablet      lidocaine (LIDODERM) 5 % Place 1 patch onto the skin daily. Remove & Discard patch within 12 hours or as directed by MD 30 patch 0   metFORMIN (GLUCOPHAGE) 500 MG tablet      Multiple  Vitamin (MULTI VITAMIN) TABS      neomycin-bacitracin-polymyxin (NEOSPORIN) OINT Apply 1 application topically daily  as needed for irritation or wound care.     Omega 3 1000 MG CAPS      pantoprazole (PROTONIX) 40 MG tablet Take 40 mg by mouth daily.     PARoxetine (PAXIL) 20 MG tablet Take 20 mg by mouth daily.     predniSONE (DELTASONE) 10 MG tablet 4 tabs for 2 days, then 3 tabs for 2 days, 2 tabs for 2 days, then 1 tab for 2 days, then stop 20 tablet 0   rosuvastatin (CRESTOR) 20 MG tablet Take 20 mg by mouth daily as needed.     oxyCODONE-acetaminophen (PERCOCET) 10-325 MG tablet Take 1 tablet by mouth every 6 (six) hours as needed for pain. 20 tablet 0   No facility-administered medications prior to visit.     Review of Systems:   Constitutional: No weight loss or gain, night sweats, fevers, chills. +Fatigue, lassitude HEENT: No headaches, difficulty swallowing, tooth/dental problems, or sore throat. No sneezing, itching, ear ache, nasal congestion, or post nasal drip CV:  No chest pain, orthopnea, PND, swelling in lower extremities, anasarca, dizziness, palpitations, syncope Resp: + Chest tightness/discomfort with deep inspiration on right side; increased SOB; productive cough with purulent sputum.  No hemoptysis. No wheezing.  No chest wall deformity GI:  No heartburn, indigestion, abdominal pain, nausea, vomiting, diarrhea, change in bowel habits, loss of appetite, bloody stools.  GU: No dysuria, change in color of urine, urgency or frequency.  No flank pain, no hematuria  Skin: No rash, lesions, ulcerations MSK:  No joint pain or swelling.  No decreased range of motion.  No back pain. Neuro: No dizziness or lightheadedness. + Balance instability; frequent falls Psych: No depression or anxiety. Mood stable.     Physical Exam:  BP 124/68 (BP Location: Left Arm, Patient Position: Sitting, Cuff Size: Normal)   Pulse 87   Temp 98 F (36.7 C) (Oral)   Ht 5' 6"  (1.676 m)   Wt  124 lb (56.2 kg)   SpO2 94%   BMI 20.01 kg/m   GEN: Pleasant, interactive, chronically-ill appearing; in no acute distress. HEENT:  Normocephalic and atraumatic. PERRLA. Sclera white. Nasal turbinates pink, moist and patent bilaterally. No rhinorrhea present. Oropharynx pink and moist, without exudate or edema. No lesions, ulcerations, or postnasal drip.  NECK:  Supple w/ fair ROM. No JVD present. Normal carotid impulses w/o bruits. Thyroid symmetrical with no goiter or nodules palpated. No lymphadenopathy.   CV: RRR, no m/r/g, no peripheral edema. Pulses intact, +2 bilaterally. No cyanosis, pallor or clubbing. PULMONARY:  Unlabored, regular breathing.  Scattered minimal expiratory wheezes and rhonchi bilaterally A&P . No accessory muscle use. No dullness to percussion. GI: BS present and normoactive. Soft, non-tender to palpation. No organomegaly or masses detected. No CVA tenderness. MSK: Tenderness to palpation right chest wall.  No erythema, warmth. Cap refil <2 sec all extrem. No deformities or joint swelling noted.  Neuro: A/Ox3. No focal deficits noted.   Skin: Warm, no lesions or rashe Psych: Normal affect and behavior. Judgement and thought content appropriate.     Lab Results:  CBC    Component Value Date/Time   WBC 4.3 02/21/2022 1511   WBC 6.0 12/28/2021 0232   RBC 3.51 (L) 02/21/2022 1511   RBC 2.92 (L) 12/28/2021 0232   HGB 9.8 (L) 02/21/2022 1511   HCT 32.2 (L) 02/21/2022 1511   PLT 226 02/21/2022 1511   MCV 92 02/21/2022 1511   MCH 27.9 02/21/2022 1511   MCH 29.1 12/28/2021 0232  MCHC 30.4 (L) 02/21/2022 1511   MCHC 31.1 12/28/2021 0232   RDW 16.0 (H) 02/21/2022 1511   LYMPHSABS 0.9 02/21/2022 1511   MONOABS 0.0 (L) 10/20/2021 1357   EOSABS 0.1 02/21/2022 1511   BASOSABS 0.0 02/21/2022 1511    BMET    Component Value Date/Time   NA 137 02/21/2022 1511   K 4.2 02/21/2022 1511   CL 99 02/21/2022 1511   CO2 23 02/21/2022 1511   GLUCOSE 94 02/21/2022 1511    GLUCOSE 113 (H) 12/26/2021 0956   BUN 9 02/21/2022 1511   CREATININE 0.84 02/21/2022 1511   CREATININE 1.06 (H) 10/20/2021 1357   CREATININE 0.85 05/03/2012 0942   CALCIUM 9.3 02/21/2022 1511   GFRNONAA >60 12/26/2021 0956   GFRNONAA 57 (L) 10/20/2021 1357   GFRAA 58 (L) 07/05/2020 1137    BNP    Component Value Date/Time   BNP 35.8 02/28/2021 1056     Imaging:  DG Chest 2 View  Result Date: 03/09/2022 CLINICAL DATA:  increased SOB; chest discomfort with deep breathing; recent fall on right side EXAM: CHEST - 2 VIEW COMPARISON:  Mar 03, 2021 FINDINGS: The heart size and mediastinal contours are within normal limits. There is consolidation at the paramediastinal right mid lung zone and is new. Small right pleural effusion. As before, again seen are the diffuse reticulonodular interstitial changes without significant interval change. The visualized skeletal structures are unremarkable. IMPRESSION: There is consolidation at the right paramediastinal mid lung zone and is new likely on the basis of pneumonia. Small right pleural effusion. Prominent reticulonodular interstitial changes without significant interval change. Electronically Signed   By: Frazier Richards M.D.   On: 03/09/2022 17:09   XR Knee 1-2 Views Right  Result Date: 02/18/2022 AP and lateral views of right knee reviewed.  Right patella fracture in good position and alignment with excellent alignment of the articular surface of the patella.  No change in fracture alignment or any displacement since prior radiographs.  Fracture line still identified with some increased fracture consolidation since last radiographs.        Latest Ref Rng & Units 05/12/2020    4:23 PM  PFT Results  FVC-Pre L 2.87    FVC-Predicted Pre % 86    Pre FEV1/FVC % % 77    FEV1-Pre L 2.20    FEV1-Predicted Pre % 87    DLCO uncorrected ml/min/mmHg 12.27    DLCO UNC% % 58    DLCO corrected ml/min/mmHg 12.27    DLCO COR %Predicted % 58    DLVA  Predicted % 76      No results found for: NITRICOXIDE      Assessment & Plan:   CAP (community acquired pneumonia) Clinically worse. VS stable. At this point, she has failed outpatient treatment as we are very limited on abx choices given her allergies. I advised that the safest option for her is to be hospitalized with further workup and IV abx and steroids. Very hesitant initially but after lengthy discussion with her and her partner, she agreed. Declined EMS transport; will go via private vehicle. She wants to go to [facility] - called report to ED charge nurse, Otila Kluver.   COPD with acute exacerbation (HCC) Worsening symptoms. Further eval/management inpatient recommended.  Small cell lung cancer, right middle lobe (HCC) Suspect pleural effusion is parapneumonic; however, need to consider possibility of malignant effusion. Monitor and consider thoracentesis if it does not resolve with pna.    I spent 41  minutes of dedicated to the care of this patient on the date of this encounter to include pre-visit review of records, face-to-face time with the patient discussing conditions above, post visit ordering of testing, clinical documentation with the electronic health record, making appropriate referrals as documented, and communicating necessary findings to members of the patients care team.  Clayton Bibles, NP 03/15/2022  Pt aware and understands NP's role.

## 2022-03-15 NOTE — Assessment & Plan Note (Signed)
Suspect pleural effusion is parapneumonic; however, need to consider possibility of malignant effusion. Monitor and consider thoracentesis if it does not resolve with pna.

## 2022-03-15 NOTE — Progress Notes (Signed)
Unable to change weight on flowsheet, patient actual weight is 127.3lbs

## 2022-03-15 NOTE — Assessment & Plan Note (Signed)
Worsening symptoms. Further eval/management inpatient recommended.

## 2022-03-16 ENCOUNTER — Inpatient Hospital Stay (HOSPITAL_COMMUNITY): Payer: Medicare HMO

## 2022-03-16 DIAGNOSIS — I5033 Acute on chronic diastolic (congestive) heart failure: Secondary | ICD-10-CM

## 2022-03-16 DIAGNOSIS — I5043 Acute on chronic combined systolic (congestive) and diastolic (congestive) heart failure: Secondary | ICD-10-CM | POA: Diagnosis not present

## 2022-03-16 DIAGNOSIS — J189 Pneumonia, unspecified organism: Secondary | ICD-10-CM | POA: Diagnosis not present

## 2022-03-16 DIAGNOSIS — J9621 Acute and chronic respiratory failure with hypoxia: Secondary | ICD-10-CM | POA: Diagnosis not present

## 2022-03-16 LAB — APTT: aPTT: 34 seconds (ref 24–36)

## 2022-03-16 LAB — COMPREHENSIVE METABOLIC PANEL
ALT: 14 U/L (ref 0–44)
AST: 16 U/L (ref 15–41)
Albumin: 3.3 g/dL — ABNORMAL LOW (ref 3.5–5.0)
Alkaline Phosphatase: 51 U/L (ref 38–126)
Anion gap: 7 (ref 5–15)
BUN: 23 mg/dL (ref 8–23)
CO2: 28 mmol/L (ref 22–32)
Calcium: 9.2 mg/dL (ref 8.9–10.3)
Chloride: 101 mmol/L (ref 98–111)
Creatinine, Ser: 0.86 mg/dL (ref 0.44–1.00)
GFR, Estimated: >60 mL/min (ref >60)
Glucose, Bld: 153 mg/dL — ABNORMAL HIGH (ref 70–99)
Potassium: 4.6 mmol/L (ref 3.5–5.1)
Sodium: 136 mmol/L (ref 135–145)
Total Bilirubin: 0.6 mg/dL (ref 0.3–1.2)
Total Protein: 6.6 g/dL (ref 6.5–8.1)

## 2022-03-16 LAB — CBC
HCT: 30.8 % — ABNORMAL LOW (ref 36.0–46.0)
Hemoglobin: 9.1 g/dL — ABNORMAL LOW (ref 12.0–15.0)
MCH: 28.3 pg (ref 26.0–34.0)
MCHC: 29.5 g/dL — ABNORMAL LOW (ref 30.0–36.0)
MCV: 96 fL (ref 80.0–100.0)
Platelets: 202 10*3/uL (ref 150–400)
RBC: 3.21 MIL/uL — ABNORMAL LOW (ref 3.87–5.11)
RDW: 16.2 % — ABNORMAL HIGH (ref 11.5–15.5)
WBC: 3.8 10*3/uL — ABNORMAL LOW (ref 4.0–10.5)
nRBC: 0 % (ref 0.0–0.2)

## 2022-03-16 LAB — LACTIC ACID, PLASMA: Lactic Acid, Venous: 1 mmol/L (ref 0.5–1.9)

## 2022-03-16 LAB — PROCALCITONIN: Procalcitonin: <0.1 ng/mL

## 2022-03-16 LAB — HIV ANTIBODY (ROUTINE TESTING W REFLEX): HIV Screen 4th Generation wRfx: NONREACTIVE

## 2022-03-16 LAB — ECHOCARDIOGRAM COMPLETE
Area-P 1/2: 2.65 cm2
Height: 66 in
S' Lateral: 5 cm
Single Plane A4C EF: 24.2 %
Weight: 2000 oz

## 2022-03-16 LAB — MRSA NEXT GEN BY PCR, NASAL: MRSA by PCR Next Gen: NOT DETECTED

## 2022-03-16 LAB — PHOSPHORUS: Phosphorus: 3.4 mg/dL (ref 2.5–4.6)

## 2022-03-16 LAB — HEMOGLOBIN A1C
Hgb A1c MFr Bld: 5.8 % — ABNORMAL HIGH (ref 4.8–5.6)
Mean Plasma Glucose: 119.76 mg/dL

## 2022-03-16 LAB — GLUCOSE, CAPILLARY
Glucose-Capillary: 142 mg/dL — ABNORMAL HIGH (ref 70–99)
Glucose-Capillary: 172 mg/dL — ABNORMAL HIGH (ref 70–99)

## 2022-03-16 LAB — MAGNESIUM: Magnesium: 1.8 mg/dL (ref 1.7–2.4)

## 2022-03-16 MED ORDER — OXYCODONE HCL 5 MG PO TABS
5.0000 mg | ORAL_TABLET | Freq: Four times a day (QID) | ORAL | Status: DC | PRN
  Administered 2022-03-16 – 2022-03-17 (×3): 5 mg via ORAL
  Filled 2022-03-16 (×3): qty 1

## 2022-03-16 MED ORDER — FUROSEMIDE 10 MG/ML IJ SOLN
40.0000 mg | Freq: Once | INTRAMUSCULAR | Status: AC
  Administered 2022-03-16: 40 mg via INTRAVENOUS
  Filled 2022-03-16: qty 4

## 2022-03-16 MED ORDER — EMPAGLIFLOZIN 25 MG PO TABS
25.0000 mg | ORAL_TABLET | Freq: Every day | ORAL | Status: DC
  Administered 2022-03-17 – 2022-03-18 (×2): 25 mg via ORAL
  Filled 2022-03-16 (×3): qty 1

## 2022-03-16 MED ORDER — LOSARTAN POTASSIUM 25 MG PO TABS
12.5000 mg | ORAL_TABLET | Freq: Every day | ORAL | Status: DC
  Administered 2022-03-17 – 2022-03-18 (×2): 12.5 mg via ORAL
  Filled 2022-03-16 (×2): qty 1

## 2022-03-16 MED ORDER — SODIUM CHLORIDE 0.9 % IV SOLN: 2.0000 g | Freq: Two times a day (BID) | INTRAVENOUS | Status: DC

## 2022-03-16 NOTE — Consult Note (Signed)
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 CAP.  Patient was diagnosed with CAP  one week PTA week  as started on doxycycline and azithromycin (due to being allergic to Cipro and Augmentin) as well as prednisone taper, she went for a follow-up day of admit  and reported that symptoms are worse with more fatigue and worsening shortness of breath with minimal activity.  She continued to have productive cough with yellow phlegm and continues to have chest discomfort on coughing and on inspiration.  O2 sat at home has been in the 80s especially in the mornings despite increasing supplemental oxygen to 3.5L from 2 LPM.  She has been compliant with medication and only have 1 day left for doxycycline, azithromycin was already completed.  Rx chronically on Trelegy and has had to use albuterol inhaler several times in a day.     ED Course:  In the emergency department, she was intermittently tachypneic, but other vital signs were within normal range, O2 sat was 92-94% on supplemental oxygen at 3 LPM.  Work-up in the ED showed normocytic anemia, normal BMP except for hyperglycemia, lactic acid 2.0 and BNP 1,607.  Blood culture was pending. Chest x-ray showed 1. Continued right-sided pneumonia and parapneumonic effusion. 2. Chronic background scarring  and fibrosis. She was started on IV vancomycin and Zosyn.  IV Solu-Medrol 125 mg x 1 was given.  Hospitalist was asked to admit patient for further evaluation and management.  6/2 noon :  feeling better since admit.  Some pain with deep inspiration on R,  had some discolored mucus 6/1 but none today, no fever/ rigors.  Says sats usually drop in mid 80s p walks on RA and does not check sats while walking or titrate to any specific sats with ex        Significant Hospital Events: Including procedures, antibiotic start and stop dates in addition to other pertinent events   Echo 6/2 EF around 30-40 but TDS    Scheduled Meds:  colestipol  1 g Oral Daily   dextromethorphan-guaiFENesin  1 tablet Oral BID   [START ON 03/17/2022] empagliflozin  25 mg Oral Daily   enoxaparin (LOVENOX) injection  40 mg Subcutaneous Q24H   ferrous sulfate  325 mg Oral Daily   ipratropium-albuterol  3 mL Nebulization TID   levothyroxine  112 mcg Oral QAC breakfast   [START ON 03/17/2022] losartan  12.5 mg Oral Daily   methylPREDNISolone (SOLU-MEDROL) injection  40 mg Intravenous Q12H   pantoprazole  40 mg Oral Daily   rosuvastatin  20 mg Oral Daily   Continuous Infusions:  ceFEPIme (MAXIPIME) 2 GM IVPB (Mini-Bag Plus)     vancomycin     PRN Meds:.acetaminophen **OR** acetaminophen, ipratropium-albuterol, ondansetron **OR** ondansetron (ZOFRAN) IV, oxyCODONE    Interim History / Subjective:  Nad sitting up at 60 degrees  in bed   Objective   Blood pressure 123/78, pulse 92, temperature (!) 97.4 F (36.3 C), resp. rate (!) 23, height 5' 6"  (1.676 m), weight 56.7 kg, SpO2 93 %.    FiO2 (%):  [32 %] 32 %   Intake/Output Summary (Last 24 hours) at 03/16/2022 1327 Last data filed at 03/16/2022 0042 Gross per 24 hour  Intake 542.07 ml  Output --  Net 542.07 ml   Filed Weights   03/15/22 1705  Weight: 56.7 kg    Examination: Tmax:  98.5 General appearance:    chronically > acutely ill appearing   At Rest  02 sats  97% on 3lpm NP   No jvd Oropharynx clear,  mucosa nl Neck supple Lungs very distant  exp > insp rhonchi bilaterally RRR no s3 or or sign murmur Abd soft with  excursion  Extr warm with no edema or clubbing noted Neuro  Sensorium intact,  no apparent motor deficits    I personally reviewed images and agree with radiology impression as follows:  CXR:   pa and lateral 6/1 1. Continued right-sided pneumonia and parapneumonic effusion. 2. Chronic background scarring and fibrosis. My review: marked vol loss on R suggested by trachea deviated to R      Assessment & Plan:  1) Acute on chronic hypoxemic resp failure improving with rx for CAP but with significant residual scarring/atx and small effusion at R lung base   Advised: Make sure you check your oxygen saturation  AT  your highest level of activity (not after you stop)   to be sure it stays over 90% and adjust  02 flow upward to maintain this level if needed but remember to turn it back to previous settings when you stop (to conserve your supply).    2) RLL vol loss by cxr multifactorial but doubt airway obst based on prior FOB and mostly this is likely post RT post pna scarring  - can't excluded malignancy but no problem with d/c to outpt f/u : [facility]  3) new systolic chf  > rx per cards   She does not have copd but more likely AB and ok to continue trelegy for now and have her f/u with Dr [name]. CTa should be considered if not improving with optimal outpt rx       Labs   CBC: Recent Labs  Lab 03/15/22 1754 03/16/22 0014  WBC 4.0 3.8*  HGB 9.1* 9.1*  HCT 31.0* 30.8*  MCV 94.8 96.0  PLT 201 643    Basic Metabolic Panel: Recent Labs  Lab 03/15/22 1754 03/16/22 0014  NA 135 136  K 4.2 4.6  CL 100 101  CO2 28 28  GLUCOSE 224* 153*  BUN 21 23  CREATININE 0.92 0.86  CALCIUM 9.3 9.2  MG  --  1.8  PHOS  --  3.4   GFR: Estimated Creatinine Clearance: 54.5 mL/min (by C-G formula based on SCr of 0.86  mg/dL). Recent Labs  Lab 03/15/22 1754 03/15/22 1942 03/15/22 2136 03/16/22 0014  PROCALCITON  --   --   --  <0.10  WBC 4.0  --   --  3.8*  LATICACIDVEN 2.0* 2.8* 1.6 1.0    Liver Function Tests: Recent Labs  Lab 03/16/22 0014  AST 16  ALT 14  ALKPHOS 51  BILITOT 0.6  PROT 6.6  ALBUMIN 3.3*   No results for input(s): LIPASE, AMYLASE in the last 168 hours. No results for input(s): AMMONIA in the last 168  hours.  ABG    Component Value Date/Time   PHART 7.413 09/04/2014 2018   PCO2ART 41.8 09/04/2014 2018   PO2ART 54.3 (L) 09/04/2014 2018   HCO3 26.1 (H) 09/04/2014 2018   TCO2 23.7 09/04/2014 2018   ACIDBASEDEF 4.4 (H) 01/01/2013 0450   O2SAT 89.8 09/04/2014 2018     Coagulation Profile: No results for input(s): INR, PROTIME in the last 168 hours.  Cardiac Enzymes: No results for input(s): CKTOTAL, CKMB, CKMBINDEX, TROPONINI in the last 168 hours.  HbA1C: Hgb A1c MFr Bld  Date/Time Value Ref Range Status  03/15/2022 09:36 PM 5.8 (H) 4.8 - 5.6 % Final    Comment:    (NOTE) Pre diabetes:          5.7%-6.4%  Diabetes:              >6.4%  Glycemic control for   <7.0% adults with diabetes   03/01/2021 05:06 AM 6.5 (H) 4.8 - 5.6 % Final    Comment:    (NOTE) Pre diabetes:          5.7%-6.4%  Diabetes:              >6.4%  Glycemic control for   <7.0% adults with diabetes     CBG: Recent Labs  Lab 03/15/22 2236  GLUCAP 180*    Review of Systems:     Past Medical History:  She,  has a past medical history of Acute on chronic diastolic CHF (congestive heart failure) ([place]) (02/28/2021), Allergic rhinitis, Amputation of hand, right ([place]) (1984), Anemia, Anxiety, ASCVD (arteriosclerotic cardiovascular disease), Cholelithiasis, COPD (chronic obstructive pulmonary disease) ([place]), DDD (degenerative disc disease), lumbar, Depression, Diabetes mellitus, Diarrhea, DVT (deep venous thrombosis) ([place]), GERD (gastroesophageal reflux disease), Headache, History  of kidney stones, History of radiation therapy (06/28/2020-08/08/2020), History of radiation therapy (03/22/2021), Hyperlipidemia, Hypertension, Hypotension, Hypothyroidism, IBS (irritable bowel syndrome), Low back pain, Myocardial infarction ([place]) (01/1995), Nephrolithiasis (2006), Panic attacks, Peripheral neuropathy, Peripheral vascular disease ([place]), Pneumonia (12/22/2019), Sciatic pain, Small cell lung cancer ([place]), Tobacco abuse, Tremor, and Weakness of extremity.   Surgical History:   Past Surgical History:  Procedure Laterality Date   AGILE CAPSULE N/A 03/07/2020   Procedure: AGILE CAPSULE;  Surgeon: Daneil Dolin, MD;  [facility];  Service: Endoscopy;  Laterality: N/A;  7:30am   BACK SURGERY     fusion   BRONCHIAL NEEDLE ASPIRATION BIOPSY N/A 05/20/2020   Procedure: BRONCHIAL NEEDLE ASPIRATION BIOPSIES;  Surgeon: Collene Gobble, MD;  Location: [facility];  Service: Pulmonary;  Laterality: N/A;   CHOLECYSTECTOMY     COLONOSCOPY  01/2009   ONG:EXBMWU rectum/repeat in 5 yrs   COLONOSCOPY N/A 04/29/2014   Dr.Rourk- attempted/incomplete colonoscopy. inadequate prep   COLONOSCOPY N/A 05/27/2014   XLK:GMWNUUVOZ coli. Colonic polyps-removed as described above.Status post segmental biopsy. single tubular adenoma and random colon bx neg   COLONOSCOPY WITH PROPOFOL N/A 12/23/2019   Surgeon: Ronnette Juniper, MD; single polyp removed from colon (path-lipoma), single non-bleeding colonic angioectasia. Treated with argon plasma coagulation (APC). Friable (with contact bleeding) mucosa in the transverse colon. Clip (MR conditional) was placed.   DILATION AND CURETTAGE OF UTERUS  1974   ESOPHAGOGASTRODUODENOSCOPY  05/2010   Dr. Tessie Fass, erosion. 20F dilation   ESOPHAGOGASTRODUODENOSCOPY N/A 04/29/2014   Dr.Rourk- normal esophagus s/p passage of maloney dilator. small hiatal hernia- bx= chronic inflammation.   ESOPHAGOGASTRODUODENOSCOPY (EGD) WITH PROPOFOL N/A 12/23/2019   Surgeon:  Ronnette Juniper, MD;  mild gastritis (no specimen collected)  GIVENS CAPSULE STUDY N/A 03/22/2020   Surgeon: Daneil Dolin, MD; few gastric erosions and small AVM. Few scattered small bowel erosions. Couple of small bowel AVMs.   HEMOSTASIS CLIP PLACEMENT  12/23/2019   Procedure: HEMOSTASIS CLIP PLACEMENT;  Surgeon: Ronnette Juniper, MD;  Location: [facility];  Service: Gastroenterology;;   HOT HEMOSTASIS N/A 12/23/2019   Procedure: HOT HEMOSTASIS (ARGON PLASMA COAGULATION/BICAP);  Surgeon: Ronnette Juniper, MD;  Location: [facility];  Service: Gastroenterology;  Laterality: N/A;   KNEE SURGERY Left    LEFT HEART CATH AND CORONARY ANGIOGRAPHY N/A 12/24/2019   Procedure: LEFT HEART CATH AND CORONARY ANGIOGRAPHY;  Surgeon: Nigel Mormon, MD;  Location: [facility];  Service: Cardiovascular;  Laterality: N/A;   MALONEY DILATION N/A 04/29/2014   Procedure: Venia Minks DILATION;  Surgeon: Daneil Dolin, MD;  [facility];  Service: Endoscopy;  Laterality: N/A;   ORIF PATELLA Right 12/26/2021   Procedure: RIGHT OPEN REDUCTION INTERNAL (ORIF) FIXATION PATELLA;  Surgeon: Meredith Pel, MD;  Location: [facility];  Service: Orthopedics;  Laterality: Right;   PARTIAL HYSTERECTOMY  1978   POLYPECTOMY  12/23/2019   Procedure: POLYPECTOMY;  Surgeon: Ronnette Juniper, MD;  Location: [facility];  Service: Gastroenterology;;   SHOULDER SURGERY Left    Left shoulder for RTC;left arm surgery '98/left hand surgery 2001   TOTAL ABDOMINAL HYSTERECTOMY W/ BILATERAL SALPINGOOPHORECTOMY  3976   UMBILICAL HERNIA REPAIR  2008   VIDEO BRONCHOSCOPY WITH ENDOBRONCHIAL ULTRASOUND N/A 05/20/2020   Procedure: VIDEO BRONCHOSCOPY WITH ENDOBRONCHIAL ULTRASOUND;  Surgeon: Collene Gobble, MD;  Location: [facility];  Service: Pulmonary;  Laterality: N/A;     Social History:   reports that she has been smoking cigarettes. She has a 10.00 pack-year smoking history. She has never used smokeless tobacco. She reports  that she does not drink alcohol and does not use drugs.   Family History:  Her family history includes Alcohol abuse in her father; Aneurysm in her father; Bipolar disorder in her mother; Colon cancer in her paternal grandfather; Dementia in her mother; Depression in her mother; Healthy in her child; Pulmonary fibrosis in her mother.   Allergies Allergies  Allergen Reactions   Bee Venom Shortness Of Breath and Swelling   Iohexol Shortness Of Breath and Other (See Comments)    CHEST TIGHTNESS and BREATHING PROBLEMS- NEEDS PRE-MEDS 13 hours beforehand-  premeds given 06/08/2020 without incident    Tape Other (See Comments)    Tears the skin!!   Betadine [Povidone Iodine] Rash   Ciprofloxacin Rash   Latex Rash and Other (See Comments)    NO latex gloves!!    Penicillins Swelling    Did it involve swelling of the face/tongue/throat, SOB, or low BP? Yes Did it involve sudden or severe rash/hives, skin peeling, or any reaction on the inside of your mouth or nose? No Did you need to seek medical attention at a hospital or doctor's office? Yes When did it last happen?      1996 If all above answers are "NO", may proceed with cephalosporin use.   Povidone-Iodine Rash     Home Medications  Prior to Admission medications   Medication Sig Start Date End Date Taking? Authorizing Provider  albuterol (PROVENTIL) (2.5 MG/3ML) 0.083% nebulizer solution Take 3 mLs (2.5 mg total) by nebulization every 6 (six) hours as needed for up to 30 doses for wheezing or shortness of breath. 11/14/21  Yes Chesley Mires, MD  albuterol (VENTOLIN HFA) 108 (90 Base) MCG/ACT  inhaler INHALE TWO PUFFS INTO THE LUNGS EVERY FOUR HOURS AS NEEDED FOR WHEEZING OR SHORTNESS OF BREATH Patient taking differently: Inhale 2 puffs into the lungs every 4 (four) hours as needed for wheezing or shortness of breath. 01/17/22  Yes Chesley Mires, MD  azithromycin (ZITHROMAX) 250 MG tablet Take 2 tabs on day 1 followed by 1 tab daily for four  additional days. 03/09/22  Yes Cobb, Karie Schwalbe, NP  Benzocaine (BOIL-EASE EX) Apply 1 application. topically daily as needed (Boil).   Yes [provider]  carbamide peroxide (DEBROX) 6.5 % OTIC solution Place 5 drops into both ears daily as needed (ears).   Yes [provider]  colestipol (COLESTID) 1 g tablet TAKE 2 TABLETS BY MOUTH ONCE DAILY. DO NOT TAKE WITHIN TWO HOURS OF OTHER MEDS. 03/05/22  Yes Aliene Altes S, PA-C  diazepam (VALIUM) 5 MG tablet Take 5 mg by mouth See admin instructions. Take 5 mg 1 to 2 times as needed for anxiety. 03/13/22  Yes [provider]  diclofenac Sodium (VOLTAREN) 1 % GEL Apply topically as needed.   Yes [provider]  docusate sodium (COLACE) 100 MG capsule Take 1 capsule (100 mg total) by mouth 2 (two) times daily. 12/28/21  Yes Magnant, Charles L, PA-C  doxycycline (VIBRA-TABS) 100 MG tablet Take 1 tablet (100 mg total) by mouth 2 (two) times daily. 03/09/22  Yes Cobb, Karie Schwalbe, NP  empagliflozin (JARDIANCE) 25 MG TABS tablet Take 25 mg by mouth daily.   Yes [provider]  Ferrous Sulfate (IRON) 325 (65 Fe) MG TABS Take 1 tablet by mouth daily.   Yes [provider]  Fluticasone-Umeclidin-Vilant (TRELEGY ELLIPTA) 100-62.5-25 MCG/ACT AEPB Inhale 1 puff into the lungs daily. 03/09/22  Yes Cobb, Karie Schwalbe, NP  hydrocortisone cream 1 % Apply 1 application topically daily as needed for itching.   Yes [provider]  levothyroxine (SYNTHROID) 112 MCG tablet Take 112 mcg by mouth daily before breakfast.   Yes [provider]  metFORMIN (GLUCOPHAGE) 500 MG tablet Take 500 mg by mouth daily with breakfast. 09/14/21  Yes [provider]  Multiple Vitamin (MULTI VITAMIN) TABS    Yes [provider]  neomycin-bacitracin-polymyxin (NEOSPORIN) OINT Apply 1 application topically daily as needed for irritation or wound care.   Yes [provider]  Omega 3 1000 MG CAPS     Yes [provider]  pantoprazole (PROTONIX) 40 MG tablet Take 40 mg by mouth daily. 09/29/19  Yes [provider]  PARoxetine (PAXIL) 20 MG tablet Take 20 mg by mouth daily. 01/12/21  Yes [provider]  rosuvastatin (CRESTOR) 20 MG tablet Take 20 mg by mouth daily as needed. 01/12/21  Yes [provider]  traZODone (DESYREL) 100 MG tablet Take 100 mg by mouth at bedtime. 01/12/22  Yes [provider]  aspirin 81 MG chewable tablet Chew 1 tablet (81 mg total) by mouth 2 (two) times daily. Patient not taking: Reported on 03/15/2022 12/28/21   Magnant, Charles L, PA-C  lidocaine (LIDODERM) 5 % Place 1 patch onto the skin daily. Remove & Discard patch within 12 hours or as directed by MD Patient not taking: Reported on 03/15/2022 03/09/22   Cobb, Karie Schwalbe, NP  predniSONE (DELTASONE) 10 MG tablet 4 tabs for 2 days, then 3 tabs for 2 days, 2 tabs for 2 days, then 1 tab for 2 days, then stop Patient not taking: Reported on 03/15/2022 03/09/22   Clayton Bibles, NP  Christinia Gully, MD Pulmonary and [place] [phone]   After 7:00 pm call Elink  [phone]

## 2022-03-16 NOTE — Progress Notes (Signed)
Unable to get enough blood from patients arm for CBG check , continues to refuse fingersticks to left fingers, Notified Dr. Manuella Ghazi , will attempt to collect on arm again .

## 2022-03-16 NOTE — Progress Notes (Signed)
PROGRESS NOTE    [name]  UTM:[id number] DOB: 1951/04/23 DOA: 03/15/2022 PCP: [name], MD   Brief Narrative:    [name] is a 71 y.o. 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  She is now noted to have a new cardiomyopathy with systolic heart failure and is undergoing diuresis.  Assessment & Plan:   Principal Problem:   CAP (community acquired pneumonia) Active Problems:   Acquired hypothyroidism   Mixed hyperlipidemia   TOBACCO ABUSE   Coronary artery disease/Prior Stents 1997 and 2000/RCA occlusion 12/2019   COPD with acute exacerbation (HCC)   GERD   IDA (iron deficiency anemia)   Acute on chronic combined systolic and diastolic CHF (congestive heart failure) (HCC)   Acute on chronic respiratory failure with hypoxia (HCC)   Elevated brain natriuretic peptide (BNP) level   Type 2 diabetes mellitus with hyperglycemia (HCC)   History of lung cancer  Assessment and Plan:   Acute on chronic respiratory failure multifactorial -Most likely due to new cardiomyopathy with acute systolic CHF exacerbation -Recently treated for pneumonia and COPD exacerbation -Procalcitonin low, will DC antibiotics -Plan to continue IV steroids and breathing treatments for now  Acute systolic CHF exacerbation/new cardiomyopathy -2D echocardiogram with reduced EF of 30-35% -IV diuresis with Lasix 40 mg daily ordered -Holding beta-blocker due to wheezing on exam -Losartan -Appreciate cardiology evaluation -No outpatient ischemic evaluation anticipated, follows with Dr. Virgina Jock outpatient     History of small cell lung cancer with brain mets s/p radiation Chest x-ray showed parapneumonic effusion Follows with oncology outpatient   Type 2 diabetes mellitus with hyperglycemia Refusing blood glucose checks Hyperglycemia has improved  CAD s/p stent placement Continue Crestor Aspirin discontinued after prior GI bleed  Mixed hyperlipidemia Continue Crestor  GERD Continue Protonix  Hypothyroidism Continue Synthroid.  Iron deficiency anemia Continue ferrous sulfate  Tobacco abuse Patient states that she stopped smoking about 2 days ago      DVT prophylaxis: Lovenox Code Status: Full Family Communication: None at bedside Disposition Plan:  Status is: Inpatient Remains inpatient appropriate because: IV medications.   Consultants:  Pulmonary Cardiology  Procedures:  See below  Antimicrobials:  Anti-infectives (From admission, onward)    Start     Dose/Rate Route Frequency Ordered Stop   03/16/22 2200  ceFEPIme (MAXIPIME) 2 g in sodium chloride 0.9 % 100 mL IVPB        2 g 200 mL/hr over 30 Minutes Intravenous Every 12 hours 03/16/22 0830     03/16/22 2100  vancomycin (VANCOREADY) IVPB 750 mg/150 mL  Status:  Discontinued        750 mg 150 mL/hr over 60 Minutes Intravenous Every 24 hours 03/15/22 2038 03/16/22 1412   03/15/22 2200  ceFEPIme (MAXIPIME) 2 g in sodium chloride 0.9 % 100 mL IVPB  Status:  Discontinued        2 g 200 mL/hr over 30 Minutes Intravenous Every 8 hours 03/15/22 2018 03/16/22 0830   03/15/22 2100  vancomycin (VANCOREADY) IVPB 1250 mg/250 mL        1,250 mg 166.7 mL/hr over 90 Minutes Intravenous  Once 03/15/22 2018 03/16/22 0042  03/15/22 1930  cefTRIAXone (ROCEPHIN) 1 g in sodium chloride 0.9 % 100 mL IVPB  Status:  Discontinued        1 g 200 mL/hr over 30 Minutes Intravenous  Once 03/15/22 1924 03/15/22 1943   03/15/22 1930  azithromycin (ZITHROMAX) 500 mg in sodium chloride 0.9 % 250 mL IVPB  Status:  Discontinued         500 mg 250 mL/hr over 60 Minutes Intravenous  Once 03/15/22 1924 03/15/22 1943       Subjective: Patient seen and evaluated today with no further significant shortness of breath or concerns noted.  She states that she is feeling much better this morning.  Objective: Vitals:   03/16/22 0528 03/16/22 0836 03/16/22 1419 03/16/22 1450  BP: 123/78  126/86   Pulse: 92  96   Resp: (!) 23  18   Temp: (!) 97.4 F (36.3 C)  98.2 F (36.8 C)   TempSrc:   Oral   SpO2: 97% 93% 96% 90%  Weight:      Height:        Intake/Output Summary (Last 24 hours) at 03/16/2022 1502 Last data filed at 03/16/2022 0900 Gross per 24 hour  Intake 1022.07 ml  Output --  Net 1022.07 ml   Filed Weights   03/15/22 1705  Weight: 56.7 kg    Examination:  General exam: Appears calm and comfortable  Respiratory system: Diminished bilaterally with minimal wheezing. Respiratory effort normal.  Currently on 2 L nasal cannula Cardiovascular system: S1 & S2 heard, RRR.  Gastrointestinal system: Abdomen is soft Central nervous system: Alert and awake Extremities: No edema Skin: No significant lesions noted Psychiatry: Flat affect.    Data Reviewed: I have personally reviewed following labs and imaging studies  CBC: Recent Labs  Lab 03/15/22 1754 03/16/22 0014  WBC 4.0 3.8*  HGB 9.1* 9.1*  HCT 31.0* 30.8*  MCV 94.8 96.0  PLT 201 357   Basic Metabolic Panel: Recent Labs  Lab 03/15/22 1754 03/16/22 0014  NA 135 136  K 4.2 4.6  CL 100 101  CO2 28 28  GLUCOSE 224* 153*  BUN 21 23  CREATININE 0.92 0.86  CALCIUM 9.3 9.2  MG  --  1.8  PHOS  --  3.4   GFR: Estimated Creatinine Clearance: 54.5 mL/min (by C-G formula based on SCr of 0.86 mg/dL). Liver Function Tests: Recent Labs  Lab 03/16/22 0014  AST 16  ALT 14  ALKPHOS 51  BILITOT 0.6  PROT 6.6  ALBUMIN 3.3*   No results for input(s): LIPASE, AMYLASE in the last 168 hours. No results for input(s): AMMONIA in the last 168  hours. Coagulation Profile: No results for input(s): INR, PROTIME in the last 168 hours. Cardiac Enzymes: No results for input(s): CKTOTAL, CKMB, CKMBINDEX, TROPONINI in the last 168 hours. BNP (last 3 results) No results for input(s): PROBNP in the last 8760 hours. HbA1C: Recent Labs    03/15/22 2136  HGBA1C 5.8*   CBG: Recent Labs  Lab 03/15/22 2236  GLUCAP 180*   Lipid Profile: No results for input(s): CHOL, HDL, LDLCALC, TRIG, CHOLHDL, LDLDIRECT in the last 72 hours. Thyroid Function Tests: No results for input(s): TSH, T4TOTAL, FREET4, T3FREE, THYROIDAB in the last 72 hours. Anemia Panel: No results for input(s): VITAMINB12, FOLATE, FERRITIN, TIBC, IRON, RETICCTPCT in the last 72 hours. Sepsis Labs: Recent Labs  Lab 03/15/22 1754 03/15/22 1942 03/15/22 2136 03/16/22 0014  PROCALCITON  --   --   --  <  0.10  LATICACIDVEN 2.0* 2.8* 1.6 1.0    Recent Results (from the past 240 hour(s))  Blood culture (routine x 2)     Status: None (Preliminary result)   Collection Time: 03/15/22  7:42 PM   Specimen: BLOOD LEFT HAND  Result Value Ref Range Status   Specimen Description BLOOD LEFT HAND  Final   Special Requests   Final    BOTTLES DRAWN AEROBIC AND ANAEROBIC Blood Culture adequate volume Performed at [facility], [address], [place], [place] [address]    Culture PENDING  Incomplete   Report Status PENDING  Incomplete  Blood culture (routine x 2)     Status: None (Preliminary result)   Collection Time: 03/15/22  7:42 PM   Specimen: BLOOD LEFT ARM  Result Value Ref Range Status   Specimen Description BLOOD LEFT ARM  Final   Special Requests   Final    BOTTLES DRAWN AEROBIC AND ANAEROBIC Blood Culture results may not be optimal due to an excessive volume of blood received in culture bottles Performed at [facility], [address], [place], [place] [address]    Culture PENDING  Incomplete   Report Status PENDING  Incomplete  MRSA Next Gen by PCR, Nasal      Status: None   Collection Time: 03/16/22  9:47 AM   Specimen: Nasal Mucosa; Nasal Swab  Result Value Ref Range Status   MRSA by PCR Next Gen NOT DETECTED NOT DETECTED Final    Comment: (NOTE) The GeneXpert MRSA Assay (FDA approved for NASAL specimens only), is one component of a comprehensive MRSA colonization surveillance program. It is not intended to diagnose MRSA infection nor to guide or monitor treatment for MRSA infections. Test performance is not FDA approved in patients less than 11 years old. Performed at [facility], [address] [place], [place] [address]          Radiology Studies: DG Chest 2 View  Result Date: 03/15/2022 CLINICAL DATA:  Short of breath, history of pneumonia EXAM: CHEST - 2 VIEW COMPARISON:  03/09/2022 FINDINGS: Frontal and lateral views of the chest demonstrate a stable cardiac silhouette. There is continued consolidation within the right upper and right lower lobes, with stable right pleural effusion. Stable interstitial and ground-glass prominence throughout the remaining portions of the lungs. No pneumothorax. No acute bony abnormalities. IMPRESSION: 1. Continued right-sided pneumonia and parapneumonic effusion. 2. Chronic background scarring and fibrosis. Electronically Signed   By: Randa Ngo M.D.   On: 03/15/2022 18:23   ECHOCARDIOGRAM COMPLETE  Result Date: 03/16/2022    ECHOCARDIOGRAM REPORT   Patient Name:   [name] Date of Exam: 03/16/2022 Medical Rec #:  [id number]         Height:       66.0 in Accession #:    [id number]        Weight:       125.0 lb Date of Birth:  26-Dec-1950         BSA:          1.638 m Patient Age:    66 years          BP:           123/78 mmHg Patient Gender: F                 HR:           96 bpm. Exam Location:  [facility] Procedure: 2D Echo, Cardiac Doppler and Color Doppler Indications:  CHF  History:        Patient has prior history of Echocardiogram examinations, most                 recent 12/21/2019. CHF,  CAD and Previous Myocardial Infarction,                 COPD, Signs/Symptoms:Chest Pain; Risk Factors:Hypertension,                 Diabetes, Dyslipidemia and Former Smoker. Lung CA wity Mets,                 Patient requested the echo be terminated due to her inability to                 breathe.  Sonographer:    Wenda Low Referring Phys: [id number] OLADAPO ADEFESO  Sonographer Comments: Technically challenging study due to limited acoustic windows. Image acquisition challenging due to COPD and Image acquisition challenging due to respiratory motion. IMPRESSIONS  1. Limited study, only parasternal images and few apical images as patient declined further imaging  2. Left ventricular ejection fraction, by estimation, is 30 to 35%. The left ventricle has moderately decreased function. The left ventricle demonstrates regional wall motion abnormalities. Abnormal (paradoxical) septal motion, consistent with left bundle branch block.     The left ventricular internal cavity size was moderately dilated. Left ventricular diastolic parameters are indeterminate.  3. Right ventricular systolic function is normal. The right ventricular size is mildly enlarged.  4. The mitral valve is normal in structure. Trivial mitral valve regurgitation. No evidence of mitral stenosis.  5. The aortic valve is tricuspid. There is mild calcification of the aortic valve. Aortic valve regurgitation is not visualized. Unable to assess for aortic stenosis, gradients were not measured FINDINGS  Left Ventricle: Left ventricular ejection fraction, by estimation, is 30 to 35%. The left ventricle has moderately decreased function. The left ventricle demonstrates regional wall motion abnormalities. The left ventricular internal cavity size was moderately dilated. There is no left ventricular hypertrophy. Abnormal (paradoxical) septal motion, consistent with left bundle branch block. Left ventricular diastolic parameters are indeterminate. Right  Ventricle: The right ventricular size is mildly enlarged. Right vetricular wall thickness was not well visualized. Right ventricular systolic function is normal. Left Atrium: Left atrial size was not well visualized. Right Atrium: Right atrial size was not well visualized. Pericardium: There is no evidence of pericardial effusion. Mitral Valve: The mitral valve is normal in structure. Trivial mitral valve regurgitation. No evidence of mitral valve stenosis. MV peak gradient, 3.0 mmHg. The mean mitral valve gradient is 1.0 mmHg. Tricuspid Valve: The tricuspid valve is normal in structure. Tricuspid valve regurgitation is trivial. Aortic Valve: The aortic valve is tricuspid. There is mild calcification of the aortic valve. Aortic valve regurgitation is not visualized. Pulmonic Valve: The pulmonic valve was grossly normal. Pulmonic valve regurgitation is trivial. Aorta: The aortic root and ascending aorta are structurally normal, with no evidence of dilitation. IAS/Shunts: The interatrial septum was not well visualized.  LEFT VENTRICLE PLAX 2D LVIDd:         5.80 cm LVIDs:         5.00 cm LV PW:         0.80 cm LV IVS:        0.70 cm LVOT diam:     2.00 cm LVOT Area:     3.14 cm  LV Volumes (MOD) LV vol d, MOD A4C: 98.2 ml LV vol s,  MOD A4C: 74.4 ml LV SV MOD A4C:     98.2 ml LEFT ATRIUM         Index LA diam:    4.10 cm 2.50 cm/m                        PULMONIC VALVE AORTA                 PV Vmax:       0.72 m/s Ao Root diam: 3.10 cm PV Peak grad:  2.1 mmHg Ao Asc diam:  3.60 cm  MITRAL VALVE              TRICUSPID VALVE MV Area (PHT): 2.65 cm   TR Peak grad:   29.8 mmHg MV Peak grad:  3.0 mmHg   TR Vmax:        273.00 cm/s MV Mean grad:  1.0 mmHg MV Vmax:       0.87 m/s   SHUNTS MV Vmean:      51.9 cm/s  Systemic Diam: 2.00 cm Oswaldo Milian MD Electronically signed by Oswaldo Milian MD Signature Date/Time: 03/16/2022/10:37:24 AM    Final         Scheduled Meds:  colestipol  1 g Oral Daily    dextromethorphan-guaiFENesin  1 tablet Oral BID   [START ON 03/17/2022] empagliflozin  25 mg Oral Daily   enoxaparin (LOVENOX) injection  40 mg Subcutaneous Q24H   ferrous sulfate  325 mg Oral Daily   ipratropium-albuterol  3 mL Nebulization TID   levothyroxine  112 mcg Oral QAC breakfast   [START ON 03/17/2022] losartan  12.5 mg Oral Daily   methylPREDNISolone (SOLU-MEDROL) injection  40 mg Intravenous Q12H   pantoprazole  40 mg Oral Daily   rosuvastatin  20 mg Oral Daily   Continuous Infusions:  ceFEPIme (MAXIPIME) 2 GM IVPB (Mini-Bag Plus)       LOS: 1 day    Time spent: 35 minutes    Chozen Latulippe Darleen Crocker, DO [facility]  If 7PM-7AM, please contact night-coverage [email or web address] 03/16/2022, 3:02 PM

## 2022-03-16 NOTE — Progress Notes (Signed)
*  PRELIMINARY RESULTS* Echocardiogram 2D Echocardiogram has been performed.  [name] 03/16/2022, 9:29 AM

## 2022-03-16 NOTE — Progress Notes (Addendum)
Contacted Dr. Josephine Cables, patient refused to have fingerstick for blood sugar checks. She states that she doesn't want her fingers to go numb from the fingerpricks. After speaking with the patient she agreed to let us check them In her left arm.

## 2022-03-16 NOTE — Consult Note (Addendum)
Cardiology Consultation:   Patient ID: [name] MRN: [id number]; DOB: 06-29-1951  Admit date: 03/15/2022 Date of Consult: 03/16/2022  PCP:  [name], MD   [facility] Providers Cardiologist: Dr. Virgina Jock   Patient Profile:   [name] is a 71 y.o. 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 Manuella Ghazi.  History of Present Illness:   Ms. Leon is followed by Dr. Virgina Jock and her last visit with him was in 04/2021. She reported dizziness following recent radiation for her brain METS but denied any specific anginal symptoms. It was felt the risks of being on ASA outweighed benefits and this was discontinued with medical management recommended of her CAD.   She presented to [facility] on [facility]/15/2022 for evaluation of worsening dyspnea after having failed outpatient treatment for PNA. In talking with the patient today, she reports having worsening dyspnea at rest and with activity despite being on antibiotic therapy as an outpatient. Denies any specific chest pain or palpitations.  She does report having a productive cough with yellow phlegm. Says that she has a prescription for as needed Lasix but does not take this routinely. No recent lower extremity edema or abdominal distention.  Initial labs show WBC 4.0, Hgb 9.1, platelets 201, Na+ 135, K+ 4.2 and creatinine 0.92. BNP 1607. Lactic Acid 2.0. CXR showed continued right-sided PNA with parapneumonic effusion and background scarring or fibrosis. EKG showed NSR, HR 90 with new LBBB.   She was admitted for acute on chronic hypoxic respiratory failure in the setting of PNA. Was started on antibiotic therapy with Cefepime and Vancomycin. Given her  elevated BNP, she had an echo performed this morning which showed a reduced EF of 30-35% with wall motion abnormalities present and abnormal septal motion consistent with LBBB. No significant valve abnormalities identified but images were limited as she declined further imaging during the study.   Past Medical History:  Diagnosis Date   Acute on chronic diastolic CHF (congestive heart failure) ([place]) 02/28/2021   Allergic rhinitis    Amputation of hand, right ([place]) 1984   Anemia    Anxiety    ASCVD (arteriosclerotic cardiovascular disease)    MI in 96 requiring BMS CX; DES to M1 in 2000;normal coronary angiography in 2004   Cholelithiasis    COPD (chronic obstructive pulmonary disease) (HCC)    DDD (degenerative disc disease), lumbar    Depression    Diabetes mellitus    Type II   Diarrhea    DVT (deep venous thrombosis) ([place])    patient said no   GERD (gastroesophageal reflux disease)    Headache    migraine   History of kidney stones    2006   History of radiation therapy 06/28/2020-08/08/2020   right Lung; Dr. [name]   History of radiation therapy 03/22/2021   brain 03/02/2021-03/22/2021   Dr [name]   Hyperlipidemia    Hypertension    Hypotension    Hypothyroidism    IBS (irritable bowel syndrome)    Low back pain    Myocardial infarction [facility]) 01/1995   Nephrolithiasis 2006   stone extraction    Panic attacks    Peripheral neuropathy  Peripheral vascular disease (HCC)    legs   Pneumonia 12/22/2019   Sciatic pain    right   Small cell lung cancer (HCC)    Tobacco abuse    Tremor    Weakness of extremity    left hand weakness    Past Surgical History:  Procedure Laterality Date   AGILE CAPSULE N/A 03/07/2020   Procedure: AGILE CAPSULE;  Surgeon: Daneil Dolin, MD;  [facility];  Service: Endoscopy;  Laterality: N/A;  7:30am   BACK SURGERY     fusion   BRONCHIAL NEEDLE ASPIRATION BIOPSY N/A 05/20/2020   Procedure: BRONCHIAL NEEDLE  ASPIRATION BIOPSIES;  Surgeon: Collene Gobble, MD;  Location: [place] [facility];  Service: Pulmonary;  Laterality: N/A;   CHOLECYSTECTOMY     COLONOSCOPY  01/2009   QPY:PPJKDT rectum/repeat in 5 yrs   COLONOSCOPY N/A 04/29/2014   Dr.Rourk- attempted/incomplete colonoscopy. inadequate prep   COLONOSCOPY N/A 05/27/2014   OIZ:[id number] coli. Colonic polyps-removed as described above.Status post segmental biopsy. single tubular adenoma and random colon bx neg   COLONOSCOPY WITH PROPOFOL N/A 12/23/2019   Surgeon: Ronnette Juniper, MD; single polyp removed from colon (path-lipoma), single non-bleeding colonic angioectasia. Treated with argon plasma coagulation (APC). Friable (with contact bleeding) mucosa in the transverse colon. Clip (MR conditional) was placed.   DILATION AND CURETTAGE OF UTERUS  1974   ESOPHAGOGASTRODUODENOSCOPY  05/2010   Dr. Tessie Fass, erosion. 21F dilation   ESOPHAGOGASTRODUODENOSCOPY N/A 04/29/2014   Dr.Rourk- normal esophagus s/p passage of maloney dilator. small hiatal hernia- bx= chronic inflammation.   ESOPHAGOGASTRODUODENOSCOPY (EGD) WITH PROPOFOL N/A 12/23/2019   Surgeon: Ronnette Juniper, MD;  mild gastritis (no specimen collected)   GIVENS CAPSULE STUDY N/A 03/22/2020   Surgeon: Daneil Dolin, MD; few gastric erosions and small AVM. Few scattered small bowel erosions. Couple of small bowel AVMs.   HEMOSTASIS CLIP PLACEMENT  12/23/2019   Procedure: HEMOSTASIS CLIP PLACEMENT;  Surgeon: Ronnette Juniper, MD;  Location: [place];  Service: Gastroenterology;;   HOT HEMOSTASIS N/A 12/23/2019   Procedure: HOT HEMOSTASIS (ARGON PLASMA COAGULATION/BICAP);  Surgeon: Ronnette Juniper, MD;  Location: [place];  Service: Gastroenterology;  Laterality: N/A;   KNEE SURGERY Left    LEFT HEART CATH AND CORONARY ANGIOGRAPHY N/A 12/24/2019   Procedure: LEFT HEART CATH AND CORONARY ANGIOGRAPHY;  Surgeon: Nigel Mormon, MD;  Location: [place] [facility];  Service: Cardiovascular;  Laterality:  N/A;   MALONEY DILATION N/A 04/29/2014   Procedure: Venia Minks DILATION;  Surgeon: Daneil Dolin, MD;  [facility];  Service: Endoscopy;  Laterality: N/A;   ORIF PATELLA Right 12/26/2021   Procedure: RIGHT OPEN REDUCTION INTERNAL (ORIF) FIXATION PATELLA;  Surgeon: Meredith Pel, MD;  Location: [place];  Service: Orthopedics;  Laterality: Right;   PARTIAL HYSTERECTOMY  1978   POLYPECTOMY  12/23/2019   Procedure: POLYPECTOMY;  Surgeon: Ronnette Juniper, MD;  Location: [place];  Service: Gastroenterology;;   SHOULDER SURGERY Left    Left shoulder for RTC;left arm surgery '98/left hand surgery 2001   TOTAL ABDOMINAL HYSTERECTOMY W/ BILATERAL SALPINGOOPHORECTOMY  3382   UMBILICAL HERNIA REPAIR  2008   VIDEO BRONCHOSCOPY WITH ENDOBRONCHIAL ULTRASOUND N/A 05/20/2020   Procedure: VIDEO BRONCHOSCOPY WITH ENDOBRONCHIAL ULTRASOUND;  Surgeon: Collene Gobble, MD;  Location: [place] [facility];  Service: Pulmonary;  Laterality: N/A;     Home Medications:  Prior to Admission medications   Medication Sig Start Date End Date Taking? Authorizing Provider  albuterol (PROVENTIL) (2.5 MG/3ML) 0.083% nebulizer  solution Take 3 mLs (2.5 mg total) by nebulization every 6 (six) hours as needed for up to 30 doses for wheezing or shortness of breath. 11/14/21  Yes Chesley Mires, MD  albuterol (VENTOLIN HFA) 108 (90 Base) MCG/ACT inhaler INHALE TWO PUFFS INTO THE LUNGS EVERY FOUR HOURS AS NEEDED FOR WHEEZING OR SHORTNESS OF BREATH Patient taking differently: Inhale 2 puffs into the lungs every 4 (four) hours as needed for wheezing or shortness of breath. 01/17/22  Yes Chesley Mires, MD  azithromycin (ZITHROMAX) 250 MG tablet Take 2 tabs on day 1 followed by 1 tab daily for four additional days. 03/09/22  Yes Cobb, Karie Schwalbe, NP  Benzocaine (BOIL-EASE EX) Apply 1 application. topically daily as needed (Boil).   Yes [provider]  carbamide peroxide (DEBROX) 6.5 % OTIC solution Place 5 drops into both ears  daily as needed (ears).   Yes [provider]  colestipol (COLESTID) 1 g tablet TAKE 2 TABLETS BY MOUTH ONCE DAILY. DO NOT TAKE WITHIN TWO HOURS OF OTHER MEDS. 03/05/22  Yes Aliene Altes S, PA-C  diazepam (VALIUM) 5 MG tablet Take 5 mg by mouth See admin instructions. Take 5 mg 1 to 2 times as needed for anxiety. 03/13/22  Yes [provider]  diclofenac Sodium (VOLTAREN) 1 % GEL Apply topically as needed.   Yes [provider]  docusate sodium (COLACE) 100 MG capsule Take 1 capsule (100 mg total) by mouth 2 (two) times daily. 12/28/21  Yes Magnant, Charles L, PA-C  doxycycline (VIBRA-TABS) 100 MG tablet Take 1 tablet (100 mg total) by mouth 2 (two) times daily. 03/09/22  Yes Cobb, Karie Schwalbe, NP  empagliflozin (JARDIANCE) 25 MG TABS tablet Take 25 mg by mouth daily.   Yes [provider]  Ferrous Sulfate (IRON) 325 (65 Fe) MG TABS Take 1 tablet by mouth daily.   Yes [provider]  Fluticasone-Umeclidin-Vilant (TRELEGY ELLIPTA) 100-62.5-25 MCG/ACT AEPB Inhale 1 puff into the lungs daily. 03/09/22  Yes Cobb, Karie Schwalbe, NP  hydrocortisone cream 1 % Apply 1 application topically daily as needed for itching.   Yes [provider]  levothyroxine (SYNTHROID) 112 MCG tablet Take 112 mcg by mouth daily before breakfast.   Yes [provider]  metFORMIN (GLUCOPHAGE) 500 MG tablet Take 500 mg by mouth daily with breakfast. 09/14/21  Yes [provider]  Multiple Vitamin (MULTI VITAMIN) TABS    Yes [provider]  neomycin-bacitracin-polymyxin (NEOSPORIN) OINT Apply 1 application topically daily as needed for irritation or wound care.   Yes [provider]  Omega 3 1000 MG CAPS    Yes [provider]  pantoprazole (PROTONIX) 40 MG tablet Take 40 mg by mouth daily. 09/29/19  Yes [provider]  PARoxetine (PAXIL) 20 MG tablet Take 20 mg by mouth daily. 01/12/21  Yes [provider]   rosuvastatin (CRESTOR) 20 MG tablet Take 20 mg by mouth daily as needed. 01/12/21  Yes [provider]  traZODone (DESYREL) 100 MG tablet Take 100 mg by mouth at bedtime. 01/12/22  Yes [provider]  aspirin 81 MG chewable tablet Chew 1 tablet (81 mg total) by mouth 2 (two) times daily. Patient not taking: Reported on 03/15/2022 12/28/21   Magnant, Charles L, PA-C  lidocaine (LIDODERM) 5 % Place 1 patch onto the skin daily. Remove & Discard patch within 12 hours or as directed by MD Patient not taking: Reported on 03/15/2022 03/09/22   Belenda Cruise, Karie Schwalbe, NP  predniSONE (DELTASONE)  10 MG tablet 4 tabs for 2 days, then 3 tabs for 2 days, 2 tabs for 2 days, then 1 tab for 2 days, then stop Patient not taking: Reported on 03/15/2022 03/09/22   Clayton Bibles, NP    Inpatient Medications: Scheduled Meds:  aspirin  81 mg Oral BID   colestipol  1 g Oral Daily   dextromethorphan-guaiFENesin  1 tablet Oral BID   enoxaparin (LOVENOX) injection  40 mg Subcutaneous Q24H   ferrous sulfate  325 mg Oral Daily   insulin aspart  0-5 Units Subcutaneous QHS   insulin aspart  0-9 Units Subcutaneous TID WC   ipratropium-albuterol  3 mL Nebulization TID   levothyroxine  112 mcg Oral QAC breakfast   methylPREDNISolone (SOLU-MEDROL) injection  40 mg Intravenous Q12H   pantoprazole  40 mg Oral Daily   rosuvastatin  20 mg Oral Daily   Continuous Infusions:  ceFEPIme (MAXIPIME) 2 GM IVPB (Mini-Bag Plus)     vancomycin     PRN Meds: acetaminophen **OR** acetaminophen, ipratropium-albuterol, ondansetron **OR** ondansetron (ZOFRAN) IV  Allergies:    Allergies  Allergen Reactions   Bee Venom Shortness Of Breath and Swelling   Iohexol Shortness Of Breath and Other (See Comments)    CHEST TIGHTNESS and BREATHING PROBLEMS- NEEDS PRE-MEDS 13 hours beforehand-  premeds given 06/08/2020 without incident    Tape Other (See Comments)    Tears the skin!!   Betadine [Povidone Iodine] Rash    Ciprofloxacin Rash   Latex Rash and Other (See Comments)    NO latex gloves!!    Penicillins Swelling    Did it involve swelling of the face/tongue/throat, SOB, or low BP? Yes Did it involve sudden or severe rash/hives, skin peeling, or any reaction on the inside of your mouth or nose? No Did you need to seek medical attention at a hospital or doctor's office? Yes When did it last happen?      1996 If all above answers are "NO", may proceed with cephalosporin use.   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    Employer: UNEMPLOYED  Tobacco Use   Smoking status: Every Day    Packs/day: 0.25    Years: 40.00    Pack years: 10.00    Types: Cigarettes    Last attempt to quit: 08/30/2017    Years since quitting: 4.5   Smokeless tobacco: Never  Vaping Use   Vaping Use: Never used  Substance and Sexual Activity   Alcohol use: No   Drug use: No   Sexual activity: Not Currently    Birth control/protection: Surgical    Comment: hyst  Other Topics Concern   Not on file  Social History Narrative   Married x3Lives with husbandGED in 2005   Left handed   Drinks caffeine   One story home   Social Determinants of Health   Financial Resource Strain: Not on file  Food Insecurity: Not on file  Transportation Needs: Not on file  Physical Activity: Not on file  Stress: Not on file  Social Connections: Not on file  Intimate Partner Violence: Not on file    Family History:  Family History  Problem Relation Age of Onset   Depression Mother    Bipolar disorder Mother    Dementia Mother    Pulmonary fibrosis Mother    Alcohol abuse Father    Aneurysm Father        deceased age 81, brain    Colon cancer Paternal Grandfather        age greater than 59   Healthy Child      ROS:  Please see the history of present illness.   All other ROS reviewed and negative.     Physical Exam/Data:   Vitals:   03/15/22 2245 03/16/22 0056 03/16/22 0528 03/16/22 0836  BP:  112/71 123/78   Pulse:  87 92   Resp:  (!) 22 (!) 23   Temp:  98.5 F (36.9 C) (!) 97.4 F (36.3 C)   TempSrc:      SpO2: 95% 99% 97% 93%  Weight:      Height:        Intake/Output Summary (Last 24 hours) at 03/16/2022 1047 Last data filed at 03/16/2022 0042 Gross per 24 hour  Intake 542.07 ml  Output --  Net 542.07 ml      03/15/2022    5:05 PM 03/15/2022    2:18 PM 03/09/2022    4:18 PM  Last 3 Weights  Weight (lbs) 125 lb 124 lb 124 lb  Weight (kg) 56.7 kg 56.246 kg 56.246 kg     Body mass index is 20.18 kg/m.  General: Pleasant, thin female appearing in no acute distress.  HEENT: normal Neck: no JVD Vascular: No carotid bruits; Distal pulses 2+ bilaterally Cardiac:  normal S1, S2; RRR; no murmur. Lungs: rales along right base and diffuse wheezing along lung fields.  Abd: soft, nontender, no hepatomegaly  Ext: no pitting edema Musculoskeletal:  Amputation along right forearm.  Skin: warm and dry  Neuro:  CNs 2-12 intact, no focal abnormalities noted Psych:  Normal affect   EKG:  The EKG was personally reviewed and demonstrates: NSR, HR 90 with new LBBB.  Telemetry:  Telemetry was personally reviewed and demonstrates: NSR, HR in 80's to 90's.   Relevant CV Studies:  Cardiac Catheterization: 12/2019 LM: Normal LAD: Minimal luminal irregularities LCx: Prox LCx 30%, prox 50% OM1 stenosis      RCA: Prox RCA CTO with right-to right bridging collaterals, and left-to-right collaterals   LVEDP 28 mmHg. Hb 7.9 g/dl.    Recommendation:            She needs intervention to her prox RCA. However, need to optimize her Hb and LVEDP first. I gave her the option of optimizing over the weekend and  intervening on Monday/Tuesday. However, patient wants to go home today. I recommend the following prior to discharge.   1 U PRBC to optimize Hb. Iron supplementation on discharge. If okay with GI, start plavix and evaluate for any further bleeding, prior to coronary stenting.  IV lasix 40 mg ordered, discharge on lasix 40 PO bid.   I will arrange repeat CBC next week and outpatient follow up. Tentatively, plan for planned coronary intervention on 3/23.    Echocardiogram: 03/2022 Referring Phys: [id number] OLADAPO ADEFESO      Sonographer Comments: Technically challenging study due to limited  acoustic windows. Image acquisition challenging due to COPD and Image  acquisition challenging due to respiratory motion.  IMPRESSIONS     1. Limited study, only parasternal images and few apical images as  patient declined further  imaging   2. Left ventricular ejection fraction, by estimation, is 30 to 35%. The  left ventricle has moderately decreased function. The left ventricle  demonstrates regional wall motion abnormalities. Abnormal (paradoxical)  septal motion, consistent with left  bundle branch block.      The left ventricular internal cavity size was moderately dilated. Left  ventricular diastolic parameters are indeterminate.   3. Right ventricular systolic function is normal. The right ventricular  size is mildly enlarged.   4. The mitral valve is normal in structure. Trivial mitral valve  regurgitation. No evidence of mitral stenosis.   5. The aortic valve is tricuspid. There is mild calcification of the  aortic valve. Aortic valve regurgitation is not visualized. Unable to  assess for aortic stenosis, gradients were not measured     Laboratory Data:  High Sensitivity Troponin:  No results for input(s): TROPONINIHS in the last 720 hours.   Chemistry Recent Labs  Lab 03/15/22 1754 03/16/22 0014  NA 135 136  K 4.2 4.6  CL 100 101  CO2 28 28  GLUCOSE 224* 153*  BUN 21  23  CREATININE 0.92 0.86  CALCIUM 9.3 9.2  MG  --  1.8  GFRNONAA >60 >60  ANIONGAP 7 7    Recent Labs  Lab 03/16/22 0014  PROT 6.6  ALBUMIN 3.3*  AST 16  ALT 14  ALKPHOS 51  BILITOT 0.6   Lipids No results for input(s): CHOL, TRIG, HDL, LABVLDL, LDLCALC, CHOLHDL in the last 168 hours.  Hematology Recent Labs  Lab 03/15/22 1754 03/16/22 0014  WBC 4.0 3.8*  RBC 3.27* 3.21*  HGB 9.1* 9.1*  HCT 31.0* 30.8*  MCV 94.8 96.0  MCH 27.8 28.3  MCHC 29.4* 29.5*  RDW 16.4* 16.2*  PLT 201 202   Thyroid No results for input(s): TSH, FREET4 in the last 168 hours.  BNP Recent Labs  Lab 03/15/22 1757  BNP 1,607.0*    DDimer No results for input(s): DDIMER in the last 168 hours.   Radiology/Studies:  DG Chest 2 View  Result Date: 03/15/2022 CLINICAL DATA:  Short of breath, history of pneumonia EXAM: CHEST - 2 VIEW COMPARISON:  03/09/2022 FINDINGS: Frontal and lateral views of the chest demonstrate a stable cardiac silhouette. There is continued consolidation within the right upper and right lower lobes, with stable right pleural effusion. Stable interstitial and ground-glass prominence throughout the remaining portions of the lungs. No pneumothorax. No acute bony abnormalities. IMPRESSION: 1. Continued right-sided pneumonia and parapneumonic effusion. 2. Chronic background scarring and fibrosis. Electronically Signed   By: Randa Ngo M.D.   On: 03/15/2022 18:23     Assessment and Plan:   1. HFrEF/New Cardiomyopathy - Repeat echocardiogram this admission shows a reduced EF of 30 to 35% and she is also found to have a new left bundle branch block when compared to prior EKG tracings. 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 - BNP was elevated to 1607 on admission and she does have an effusion by CXR.  She has been treated for pneumonia but with procalcitonin negative, suspect more likely  heart failure as cause of her symptoms.  Start diuresis with IV Lasix 40 mg - She is on Jardiance as an outpatient for Type 2 DM. She also has a prescription at home to take Lasix as needed.  Hold off on beta-blocker for now given wheezing on exam.  Can add low-dose losartan prior to discharge if BP tolerates. Can follow-up with her Primary Cardiologist for further titration of GDMT pending BP response. Would not anticipate repeat ischemic evaluation as she has a chronic anemia and prior GIB and ASA was previously felt to be too high-risk.   2. CAD - She is s/p cath in 12/2019 showing CTO of proximal RCA with collaterals and nonobstructive disease along LAD and LCx. She denies any recent chest pain.  - Continue Crestor 64m daily. ASA was ordered on admission but she was not taking this prior to admission and previously felt to be too high risk for ASA. Will discontinue. Continue Crestor 257mdaily.   3. HLD - Followed by PCP. She remains on Crestor 2027maily.   4. PNA/Acute on Chronic Hypoxic Respiratory Failure - Currently on IV antibiotics and IV steroids. Pulmonology following.   5. Small Cell Lung Cancer  - Also has brain mets and being followed by Oncology as an outpatient.    Risk Assessment/Risk Scores:        New York Heart Association (NYHA) Functional Class NYHA Class II   For questions or updates, please contact [facility] Please consult [email or web address] for contact info under    Signed, BriErma HeritageA-C  03/16/2022 10:47 AM  Patient seen and examined.  Agree with above documentation.  [name] a 70 28ar old female with history of CAD, T2DM, GI bleeding, cirrhosis, COPD, small cell lung cancer with brain metastasis who we are consulted by Dr. ShaManuella Ghazir evaluation of heart failure.  She follows with Dr. PatVirgina Jockast seen 04/2021.  Most recent ischemic evaluation was cath in 12/2019 which showed CTO proximal RCA, nonobstructive disease in LAD and LCx.  At last  clinic visit with Dr. PatVirgina Jockelt the risks of aspirin outweighed the benefits and recommended discontinuing.  She presented to ED yesterday with shortness of breath.  Denies any chest pain.  Also having productive cough.  Initial vital signs notable for BP 114/54, pulse 90, SPO2 100% on 3 L.  Labs notable for sodium 135, potassium 4.2, creatinine 0.92, BNP 1607, lactate 2.0 > 2.8 > 1.6, procalcitonin less than 0.1, WBC 4.0, hemoglobin 9.1, platelets 201.  EKG shows sinus rhythm, rate 90, left bundle branch block.  Chest x-ray concerning for right-sided pneumonia and parapneumonic effusion.  Echocardiogram was limited study as patient declined further imaging but showed EF 30 to 35%.  On exam, patient is alert and oriented, regular rate and rhythm, no murmurs, bibasilar crackles, no LE edema, +JVD.  For her new systolic heart failure, she is not a good candidate for ischemic evaluation given her history of GI bleeding, for which Dr PatVirgina Jocks discontinued her aspirin.  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  She appears hypervolemic.  Suspect heart failure as cause of her symptoms rather than pneumonia with PCT negative.  Start diuresis with IV lasix 40 mg.  ChrDonato HeinzD

## 2022-03-16 NOTE — Plan of Care (Signed)

## 2022-03-16 NOTE — Progress Notes (Signed)
  Transition of Care [facility]) Screening Note   Patient Details  Name: KOREEN LIZAOLA Date of Birth: [birth year]   Transition of Care [facility]) CM/SW Contact:    Ihor Gully, LCSW Phone Number: 03/16/2022, 10:39 AM    Transition of Care Department [facility]) has reviewed patient and no TOC needs have been identified at this time. We will continue to monitor patient advancement through interdisciplinary progression rounds. If new patient transition needs arise, please place a TOC consult.

## 2022-03-17 LAB — BASIC METABOLIC PANEL
Anion gap: 8 (ref 5–15)
BUN: 30 mg/dL — ABNORMAL HIGH (ref 8–23)
CO2: 30 mmol/L (ref 22–32)
Calcium: 9.2 mg/dL (ref 8.9–10.3)
Chloride: 102 mmol/L (ref 98–111)
Creatinine, Ser: 0.99 mg/dL (ref 0.44–1.00)
GFR, Estimated: >60 mL/min (ref >60)
Glucose, Bld: 127 mg/dL — ABNORMAL HIGH (ref 70–99)
Potassium: 4 mmol/L (ref 3.5–5.1)
Sodium: 140 mmol/L (ref 135–145)

## 2022-03-17 LAB — GLUCOSE, CAPILLARY: Glucose-Capillary: 147 mg/dL — ABNORMAL HIGH (ref 70–99)

## 2022-03-17 LAB — MAGNESIUM: Magnesium: 1.8 mg/dL (ref 1.7–2.4)

## 2022-03-17 MED ORDER — FUROSEMIDE 20 MG PO TABS: 20.0000 mg | ORAL_TABLET | Freq: Every day | ORAL | 0 refills | Status: DC | PRN

## 2022-03-17 MED ORDER — LOSARTAN POTASSIUM 25 MG PO TABS
12.5000 mg | ORAL_TABLET | Freq: Every day | ORAL | 0 refills | Status: DC
Start: 2022-03-17 — End: 2022-03-18

## 2022-03-17 MED ORDER — PREDNISONE 10 MG PO TABS: 40.0000 mg | ORAL_TABLET | Freq: Every day | ORAL | 0 refills | Status: DC

## 2022-03-17 MED ORDER — DM-GUAIFENESIN ER 30-600 MG PO TB12
1.0000 | ORAL_TABLET | Freq: Two times a day (BID) | ORAL | 0 refills | Status: DC
Start: 2022-03-17 — End: 2022-04-02

## 2022-03-17 NOTE — Discharge Summary (Addendum)
Physician Discharge Summary  [name] PPI:[id number] DOB: 1951-07-17 DOA: 03/15/2022  PCP: [name], MD  Admit date: 03/15/2022  Discharge date: 03/18/2022  Admitted From:Home  Disposition:  Home  Recommendations for Outpatient Follow-up:  Follow up with PCP in 1-2 weeks Follow-up with cardiology Dr. Virgina Jock in 1-2 weeks for evaluation of new cardiomyopathy Follow-up with pulmonology outpatient in 1-2 weeks for evaluation of interstitial lung disease Continue losartan 12.5 mg daily as prescribed and continue Crestor Continue prednisone as prescribed for 5 more days and use breathing treatments as needed for shortness of breath or wheezing Continue other home medications as prior  Home Health: None  Equipment/Devices: Has home 2 L nasal cannula oxygen  Discharge Condition:Stable  CODE STATUS: Full  Diet recommendation: Heart Healthy  Brief/Interim Summary:  [name] is a 72 y.o. 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  She had 2D echocardiogram performed during her stay demonstrating findings of new cardiomyopathy with LVEF 30-35%.  She was seen by cardiology with recommendations to diurese with IV Lasix as she appeared slightly volume overloaded.  She appeared less congested on the day of discharge and had improved respiratory status and appears to be at baseline.  Cardiology has recommended initiation of losartan as noted above and she will also be given Lasix as needed for any worsening congestion or edema.  She will need close follow-up with her primary cardiologist as noted  above.  She was also seen by pulmonology and will remain on breathing treatments and steroids with follow-up outpatient.  No other acute events noted during the course of the stay.  Discharge Diagnoses:  Principal Problem:   CAP (community acquired pneumonia) Active Problems:   Acquired hypothyroidism   Mixed hyperlipidemia   TOBACCO ABUSE   Coronary artery disease/Prior Stents 1997 and 2000/RCA occlusion 12/2019   COPD with acute exacerbation (HCC)   GERD   IDA (iron deficiency anemia)   Acute on chronic combined systolic and diastolic CHF (congestive heart failure) (HCC)   Acute on chronic respiratory failure with hypoxia (HCC)   Elevated brain natriuretic peptide (BNP) level   Type 2 diabetes mellitus with hyperglycemia (HCC)   History of lung cancer  Principal discharge diagnosis: Acute on chronic hypoxemic respiratory failure-multifactorial with mild acute systolic CHF exacerbation with new cardiomyopathy combined with COPD exacerbation/ILD.  Discharge Instructions  Discharge Instructions     Ambulatory referral to Cardiology   Complete by: As directed    Ambulatory referral to Pulmonology   Complete by: As directed    Reason for referral: Interstitial Lung Disease(ILD)      Allergies as of 03/18/2022       Reactions   Bee Venom Shortness Of Breath, Swelling   Iohexol Shortness Of Breath, Other (See Comments)   CHEST TIGHTNESS and BREATHING PROBLEMS- NEEDS PRE-MEDS 13 hours beforehand-  premeds given 06/08/2020 without incident    Tape Other (See Comments)   Tears the skin!!   Betadine [povidone Iodine] Rash   Ciprofloxacin Rash   Latex Rash, Other (See Comments)   NO latex gloves!!   Penicillins Swelling   Did it involve swelling of the face/tongue/throat, SOB, or low BP?  Yes Did it involve sudden or severe rash/hives, skin peeling, or any reaction on the inside of your mouth or nose? No Did you need to seek medical attention at a hospital or doctor's office?  Yes When did it last happen?      1996 If all above answers are "NO", may proceed with cephalosporin use.   Povidone-iodine Rash        Medication List     STOP taking these medications    azithromycin 250 MG tablet Commonly known as: ZITHROMAX   doxycycline 100 MG tablet Commonly known as: VIBRA-TABS       TAKE these medications    albuterol (2.5 MG/3ML) 0.083% nebulizer solution Commonly known as: PROVENTIL Take 3 mLs (2.5 mg total) by nebulization every 6 (six) hours as needed for up to 30 doses for wheezing or shortness of breath. What changed: Another medication with the same name was changed. Make sure you understand how and when to take each.   albuterol 108 (90 Base) MCG/ACT inhaler Commonly known as: VENTOLIN HFA INHALE TWO PUFFS INTO THE LUNGS EVERY FOUR HOURS AS NEEDED FOR WHEEZING OR SHORTNESS OF BREATH What changed: See the new instructions.   aspirin 81 MG chewable tablet Chew 1 tablet (81 mg total) by mouth 2 (two) times daily.   BOIL-EASE EX Apply 1 application. topically daily as needed (Boil).   carbamide peroxide 6.5 % OTIC solution Commonly known as: DEBROX Place 5 drops into both ears daily as needed (ears).   colestipol 1 g tablet Commonly known as: COLESTID TAKE 2 TABLETS BY MOUTH ONCE DAILY. DO NOT TAKE WITHIN TWO HOURS OF OTHER MEDS.   dextromethorphan-guaiFENesin 30-600 MG 12hr tablet Commonly known as: MUCINEX DM Take 1 tablet by mouth 2 (two) times daily for 10 days.   diazepam 5 MG tablet Commonly known as: VALIUM Take 5 mg by mouth See admin instructions. Take 5 mg 1 to 2 times as needed for anxiety.   diclofenac Sodium 1 % Gel Commonly known as: VOLTAREN Apply topically as needed.   docusate sodium 100 MG capsule Commonly known as: COLACE Take 1 capsule (100 mg total) by mouth 2 (two) times daily.   empagliflozin 25 MG Tabs tablet Commonly known as: JARDIANCE Take 25 mg by mouth daily.   furosemide 20 MG  tablet Commonly known as: Lasix Take 1 tablet (20 mg total) by mouth daily as needed for fluid or edema. Take for 3 lb weight gain in 24 hours or 5 lb weight gain over 7 days.   hydrocortisone cream 1 % Apply 1 application topically daily as needed for itching.   Iron 325 (65 Fe) MG Tabs Take 1 tablet by mouth daily.   levothyroxine 112 MCG tablet Commonly known as: SYNTHROID Take 112 mcg by mouth daily before breakfast.   lidocaine 5 % Commonly known as: Lidoderm Place 1 patch onto the skin daily. Remove & Discard patch within 12 hours or as directed by MD   losartan 25 MG tablet Commonly known as: COZAAR Take 0.5 tablets (12.5 mg total) by mouth daily.   metFORMIN 500 MG tablet Commonly known as: GLUCOPHAGE Take 500 mg by mouth daily with breakfast.   Multi Vitamin Tabs   neomycin-bacitracin-polymyxin Oint Commonly known as: NEOSPORIN Apply 1 application topically daily as needed for irritation or wound care.   Omega 3 1000 MG Caps   pantoprazole 40 MG tablet Commonly known as: PROTONIX Take 40 mg by mouth daily.   PARoxetine 20 MG tablet  Commonly known as: PAXIL Take 20 mg by mouth daily.   predniSONE 10 MG tablet Commonly known as: DELTASONE Take 4 tablets (40 mg total) by mouth daily for 5 days. What changed:  how much to take how to take this when to take this additional instructions   rosuvastatin 20 MG tablet Commonly known as: CRESTOR Take 20 mg by mouth daily as needed.   traZODone 100 MG tablet Commonly known as: DESYREL Take 100 mg by mouth at bedtime.   Trelegy Ellipta 100-62.5-25 MCG/ACT Aepb Generic drug: Fluticasone-Umeclidin-Vilant Inhale 1 puff into the lungs daily.        Follow-up Information     Patwardhan, Reynold Bowen, MD. Schedule an appointment as soon as possible for a visit in 2 week(s).   Specialties: Cardiology, Radiology Contact information: [address] [place] [address] [phone]          [name], MD. Schedule an appointment as soon as possible for a visit in 1 week(s).   Specialty: Internal Medicine Contact information: [address] [place] [facility] [address] [phone]         Chesley Mires, MD. Schedule an appointment as soon as possible for a visit in 1 week(s).   Specialty: Pulmonary Disease Contact information: [address] [place] [address] [phone]                Allergies  Allergen Reactions   Bee Venom Shortness Of Breath and Swelling   Iohexol Shortness Of Breath and Other (See Comments)    CHEST TIGHTNESS and BREATHING PROBLEMS- NEEDS PRE-MEDS 13 hours beforehand-  premeds given 06/08/2020 without incident    Tape Other (See Comments)    Tears the skin!!   Betadine [Povidone Iodine] Rash   Ciprofloxacin Rash   Latex Rash and Other (See Comments)    NO latex gloves!!    Penicillins Swelling    Did it involve swelling of the face/tongue/throat, SOB, or low BP? Yes Did it involve sudden or severe rash/hives, skin peeling, or any reaction on the inside of your mouth or nose? No Did you need to seek medical attention at a hospital or doctor's office? Yes When did it last happen?      1996 If all above answers are "NO", may proceed with cephalosporin use.   Povidone-Iodine Rash    Consultations: Pulmonology Cardiology   Procedures/Studies: DG Chest 2 View  Result Date: 03/15/2022 CLINICAL DATA:  Short of breath, history of pneumonia EXAM: CHEST - 2 VIEW COMPARISON:  03/09/2022 FINDINGS: Frontal and lateral views of the chest demonstrate a stable cardiac silhouette. There is continued consolidation within the right upper and right lower lobes, with stable right pleural effusion. Stable interstitial and ground-glass prominence throughout the remaining portions of the lungs. No pneumothorax. No acute bony abnormalities. IMPRESSION: 1. Continued right-sided pneumonia and parapneumonic effusion. 2. Chronic  background scarring and fibrosis. Electronically Signed   By: Randa Ngo M.D.   On: 03/15/2022 18:23   DG Chest 2 View  Result Date: 03/09/2022 CLINICAL DATA:  increased SOB; chest discomfort with deep breathing; recent fall on right side EXAM: CHEST - 2 VIEW COMPARISON:  Mar 03, 2021 FINDINGS: The heart size and mediastinal contours are within normal limits. There is consolidation at the paramediastinal right mid lung zone and is new. Small right pleural effusion. As before, again seen are the diffuse reticulonodular interstitial changes without significant interval change. The visualized skeletal structures are unremarkable.  IMPRESSION: There is consolidation at the right paramediastinal mid lung zone and is new likely on the basis of pneumonia. Small right pleural effusion. Prominent reticulonodular interstitial changes without significant interval change. Electronically Signed   By: Frazier Richards M.D.   On: 03/09/2022 17:09   ECHOCARDIOGRAM COMPLETE  Result Date: 03/16/2022    ECHOCARDIOGRAM REPORT   Patient Name:   [name] Date of Exam: 03/16/2022 Medical Rec #:  [id number]         Height:       66.0 in Accession #:    [id number]        Weight:       125.0 lb Date of Birth:  Jul 28, 1951         BSA:          1.638 m Patient Age:    3 years          BP:           123/78 mmHg Patient Gender: F                 HR:           96 bpm. Exam Location:  [facility] Procedure: 2D Echo, Cardiac Doppler and Color Doppler Indications:    CHF  History:        Patient has prior history of Echocardiogram examinations, most                 recent 12/21/2019. CHF, CAD and Previous Myocardial Infarction,                 COPD, Signs/Symptoms:Chest Pain; Risk Factors:Hypertension,                 Diabetes, Dyslipidemia and Former Smoker. Lung CA wity Mets,                 Patient requested the echo be terminated due to her inability to                 breathe.  Sonographer:    Wenda Low Referring Phys: [id number]  OLADAPO ADEFESO  Sonographer Comments: Technically challenging study due to limited acoustic windows. Image acquisition challenging due to COPD and Image acquisition challenging due to respiratory motion. IMPRESSIONS  1. Limited study, only parasternal images and few apical images as patient declined further imaging  2. Left ventricular ejection fraction, by estimation, is 30 to 35%. The left ventricle has moderately decreased function. The left ventricle demonstrates regional wall motion abnormalities. Abnormal (paradoxical) septal motion, consistent with left bundle branch block.     The left ventricular internal cavity size was moderately dilated. Left ventricular diastolic parameters are indeterminate.  3. Right ventricular systolic function is normal. The right ventricular size is mildly enlarged.  4. The mitral valve is normal in structure. Trivial mitral valve regurgitation. No evidence of mitral stenosis.  5. The aortic valve is tricuspid. There is mild calcification of the aortic valve. Aortic valve regurgitation is not visualized. Unable to assess for aortic stenosis, gradients were not measured FINDINGS  Left Ventricle: Left ventricular ejection fraction, by estimation, is 30 to 35%. The left ventricle has moderately decreased function. The left ventricle demonstrates regional wall motion abnormalities. The left ventricular internal cavity size was moderately dilated. There is no left ventricular hypertrophy. Abnormal (paradoxical) septal motion, consistent with left bundle branch block. Left ventricular diastolic parameters are indeterminate. Right Ventricle: The right ventricular size is mildly enlarged. Right vetricular wall thickness was  not well visualized. Right ventricular systolic function is normal. Left Atrium: Left atrial size was not well visualized. Right Atrium: Right atrial size was not well visualized. Pericardium: There is no evidence of pericardial effusion. Mitral Valve: The mitral  valve is normal in structure. Trivial mitral valve regurgitation. No evidence of mitral valve stenosis. MV peak gradient, 3.0 mmHg. The mean mitral valve gradient is 1.0 mmHg. Tricuspid Valve: The tricuspid valve is normal in structure. Tricuspid valve regurgitation is trivial. Aortic Valve: The aortic valve is tricuspid. There is mild calcification of the aortic valve. Aortic valve regurgitation is not visualized. Pulmonic Valve: The pulmonic valve was grossly normal. Pulmonic valve regurgitation is trivial. Aorta: The aortic root and ascending aorta are structurally normal, with no evidence of dilitation. IAS/Shunts: The interatrial septum was not well visualized.  LEFT VENTRICLE PLAX 2D LVIDd:         5.80 cm LVIDs:         5.00 cm LV PW:         0.80 cm LV IVS:        0.70 cm LVOT diam:     2.00 cm LVOT Area:     3.14 cm  LV Volumes (MOD) LV vol d, MOD A4C: 98.2 ml LV vol s, MOD A4C: 74.4 ml LV SV MOD A4C:     98.2 ml LEFT ATRIUM         Index LA diam:    4.10 cm 2.50 cm/m                        PULMONIC VALVE AORTA                 PV Vmax:       0.72 m/s Ao Root diam: 3.10 cm PV Peak grad:  2.1 mmHg Ao Asc diam:  3.60 cm  MITRAL VALVE              TRICUSPID VALVE MV Area (PHT): 2.65 cm   TR Peak grad:   29.8 mmHg MV Peak grad:  3.0 mmHg   TR Vmax:        273.00 cm/s MV Mean grad:  1.0 mmHg MV Vmax:       0.87 m/s   SHUNTS MV Vmean:      51.9 cm/s  Systemic Diam: 2.00 cm Oswaldo Milian MD Electronically signed by Oswaldo Milian MD Signature Date/Time: 03/16/2022/10:37:24 AM    Final      Discharge Exam: Vitals:   03/17/22 2243 03/18/22 0530  BP: 112/64 115/70  Pulse: 79 75  Resp: 15 20  Temp: 98 F (36.7 C) 98 F (36.7 C)  SpO2: 98% 100%   Vitals:   03/17/22 1913 03/17/22 2243 03/18/22 0500 03/18/22 0530  BP:  112/64  115/70  Pulse: 86 79  75  Resp: 18 15  20   Temp:  98 F (36.7 C)  98 F (36.7 C)  TempSrc:      SpO2: 98% 98%  100%  Weight:   56.5 kg   Height:         General: Pt is alert, awake, not in acute distress Cardiovascular: RRR, S1/S2 +, no rubs, no gallops Respiratory: CTA bilaterally, minimal wheezing noted bilaterally, 2 L nasal cannula oxygen Abdominal: Soft, NT, ND, bowel sounds + Extremities: no edema, no cyanosis    The results of significant diagnostics from this hospitalization (including imaging, microbiology, ancillary and laboratory) are listed below for reference.  Microbiology: Recent Results (from the past 240 hour(s))  Blood culture (routine x 2)     Status: None (Preliminary result)   Collection Time: 03/15/22  7:42 PM   Specimen: BLOOD LEFT HAND  Result Value Ref Range Status   Specimen Description BLOOD LEFT HAND  Final   Special Requests   Final    BOTTLES DRAWN AEROBIC AND ANAEROBIC Blood Culture adequate volume   Culture   Final    NO GROWTH 3 DAYS Performed at [facility], [address], [place], [place] [address]    Report Status PENDING  Incomplete  Blood culture (routine x 2)     Status: None (Preliminary result)   Collection Time: 03/15/22  7:42 PM   Specimen: BLOOD LEFT ARM  Result Value Ref Range Status   Specimen Description BLOOD LEFT ARM  Final   Special Requests   Final    BOTTLES DRAWN AEROBIC AND ANAEROBIC Blood Culture results may not be optimal due to an excessive volume of blood received in culture bottles   Culture   Final    NO GROWTH 3 DAYS Performed at [facility], [address] [place], [place] [address]    Report Status PENDING  Incomplete  MRSA Next Gen by PCR, Nasal     Status: None   Collection Time: 03/16/22  9:47 AM   Specimen: Nasal Mucosa; Nasal Swab  Result Value Ref Range Status   MRSA by PCR Next Gen NOT DETECTED NOT DETECTED Final    Comment: (NOTE) The GeneXpert MRSA Assay (FDA approved for NASAL specimens only), is one component of a comprehensive MRSA colonization surveillance program. It is not intended to diagnose MRSA infection nor to guide or monitor  treatment for MRSA infections. Test performance is not FDA approved in patients less than 68 years old. Performed at [facility], [address] [place], [place] [address]      Labs: BNP (last 3 results) Recent Labs    03/15/22 1757  BNP 0,109.3*   Basic Metabolic Panel: Recent Labs  Lab 03/15/22 1754 03/16/22 0014 03/17/22 0530 03/18/22 0510  NA 135 136 140 140  K 4.2 4.6 4.0 4.1  CL 100 101 102 101  CO2 28 28 30 31   GLUCOSE 224* 153* 127* 143*  BUN 21 23 30* 32*  CREATININE 0.92 0.86 0.99 1.01*  CALCIUM 9.3 9.2 9.2 9.4  MG  --  1.8 1.8  --   PHOS  --  3.4  --   --    Liver Function Tests: Recent Labs  Lab 03/16/22 0014  AST 16  ALT 14  ALKPHOS 51  BILITOT 0.6  PROT 6.6  ALBUMIN 3.3*   No results for input(s): LIPASE, AMYLASE in the last 168 hours. No results for input(s): AMMONIA in the last 168 hours. CBC: Recent Labs  Lab 03/15/22 1754 03/16/22 0014  WBC 4.0 3.8*  HGB 9.1* 9.1*  HCT 31.0* 30.8*  MCV 94.8 96.0  PLT 201 202   Cardiac Enzymes: No results for input(s): CKTOTAL, CKMB, CKMBINDEX, TROPONINI in the last 168 hours. BNP: Invalid input(s): POCBNP CBG: Recent Labs  Lab 03/15/22 2236 03/16/22 1607 03/16/22 2216 03/17/22 1119  GLUCAP 180* 142* 172* 147*   D-Dimer No results for input(s): DDIMER in the last 72 hours. Hgb A1c Recent Labs    03/15/22 2136  HGBA1C 5.8*   Lipid Profile No results for input(s): CHOL, HDL, LDLCALC, TRIG, CHOLHDL, LDLDIRECT in the last 72 hours. Thyroid function studies No results  for input(s): TSH, T4TOTAL, T3FREE, THYROIDAB in the last 72 hours.  Invalid input(s): FREET3 Anemia work up No results for input(s): VITAMINB12, FOLATE, FERRITIN, TIBC, IRON, RETICCTPCT in the last 72 hours. Urinalysis    Component Value Date/Time   COLORURINE AMBER (A) 02/28/2021 1904   APPEARANCEUR CLOUDY (A) 02/28/2021 1904   LABSPEC 1.018 02/28/2021 1904   PHURINE 5.0 02/28/2021 1904   GLUCOSEU NEGATIVE  02/28/2021 1904   HGBUR NEGATIVE 02/28/2021 1904   BILIRUBINUR NEGATIVE 02/28/2021 1904   KETONESUR 5 (A) 02/28/2021 1904   PROTEINUR NEGATIVE 02/28/2021 1904   UROBILINOGEN 0.2 09/04/2014 1841   NITRITE NEGATIVE 02/28/2021 1904   LEUKOCYTESUR NEGATIVE 02/28/2021 1904   Sepsis Labs Invalid input(s): PROCALCITONIN,  WBC,  LACTICIDVEN Microbiology Recent Results (from the past 240 hour(s))  Blood culture (routine x 2)     Status: None (Preliminary result)   Collection Time: 03/15/22  7:42 PM   Specimen: BLOOD LEFT HAND  Result Value Ref Range Status   Specimen Description BLOOD LEFT HAND  Final   Special Requests   Final    BOTTLES DRAWN AEROBIC AND ANAEROBIC Blood Culture adequate volume   Culture   Final    NO GROWTH 3 DAYS Performed at [facility], [address], [place], [place] [address]    Report Status PENDING  Incomplete  Blood culture (routine x 2)     Status: None (Preliminary result)   Collection Time: 03/15/22  7:42 PM   Specimen: BLOOD LEFT ARM  Result Value Ref Range Status   Specimen Description BLOOD LEFT ARM  Final   Special Requests   Final    BOTTLES DRAWN AEROBIC AND ANAEROBIC Blood Culture results may not be optimal due to an excessive volume of blood received in culture bottles   Culture   Final    NO GROWTH 3 DAYS Performed at [facility], [address], [place], [place] [address]    Report Status PENDING  Incomplete  MRSA Next Gen by PCR, Nasal     Status: None   Collection Time: 03/16/22  9:47 AM   Specimen: Nasal Mucosa; Nasal Swab  Result Value Ref Range Status   MRSA by PCR Next Gen NOT DETECTED NOT DETECTED Final    Comment: (NOTE) The GeneXpert MRSA Assay (FDA approved for NASAL specimens only), is one component of a comprehensive MRSA colonization surveillance program. It is not intended to diagnose MRSA infection nor to guide or monitor treatment for MRSA infections. Test performance is not FDA approved in patients less than 77  years old. Performed at [facility], [address] [place], [place] [address]      Time coordinating discharge: 35 minutes  SIGNED:   Rodena Goldmann, DO [facility] 03/18/2022, 8:58 AM  If 7PM-7AM, please contact night-coverage [email or web address]

## 2022-03-18 LAB — BASIC METABOLIC PANEL
Anion gap: 8 (ref 5–15)
BUN: 32 mg/dL — ABNORMAL HIGH (ref 8–23)
CO2: 31 mmol/L (ref 22–32)
Calcium: 9.4 mg/dL (ref 8.9–10.3)
Chloride: 101 mmol/L (ref 98–111)
Creatinine, Ser: 1.01 mg/dL — ABNORMAL HIGH (ref 0.44–1.00)
GFR, Estimated: 60 mL/min — ABNORMAL LOW (ref >60)
Glucose, Bld: 143 mg/dL — ABNORMAL HIGH (ref 70–99)
Potassium: 4.1 mmol/L (ref 3.5–5.1)
Sodium: 140 mmol/L (ref 135–145)

## 2022-03-18 MED ORDER — LOSARTAN POTASSIUM 25 MG PO TABS: 12.5000 mg | ORAL_TABLET | Freq: Every day | ORAL | 0 refills | Status: AC

## 2022-03-18 MED ORDER — FUROSEMIDE 20 MG PO TABS: 20.0000 mg | ORAL_TABLET | Freq: Every day | ORAL | 0 refills | Status: AC | PRN

## 2022-03-18 MED ORDER — PREDNISONE 10 MG PO TABS: 40.0000 mg | ORAL_TABLET | Freq: Every day | ORAL | 0 refills | Status: AC

## 2022-03-18 NOTE — Progress Notes (Signed)
Ng Discharge Note  Admit Date:  03/15/2022 Discharge date: 03/18/2022   [name] to be D/C'd Home per MD order.  AVS completed. Patient/caregiver able to verbalize understanding.  Discharge Medication: Allergies as of 03/18/2022       Reactions   Bee Venom Shortness Of Breath, Swelling   Iohexol Shortness Of Breath, Other (See Comments)   CHEST TIGHTNESS and BREATHING PROBLEMS- NEEDS PRE-MEDS 13 hours beforehand-  premeds given 06/08/2020 without incident    Tape Other (See Comments)   Tears the skin!!   Betadine [povidone Iodine] Rash   Ciprofloxacin Rash   Latex Rash, Other (See Comments)   NO latex gloves!!   Penicillins Swelling   Did it involve swelling of the face/tongue/throat, SOB, or low BP? Yes Did it involve sudden or severe rash/hives, skin peeling, or any reaction on the inside of your mouth or nose? No Did you need to seek medical attention at a hospital or doctor's office? Yes When did it last happen?      1996 If all above answers are "NO", may proceed with cephalosporin use.   Povidone-iodine Rash        Medication List     STOP taking these medications    azithromycin 250 MG tablet Commonly known as: ZITHROMAX   doxycycline 100 MG tablet Commonly known as: VIBRA-TABS       TAKE these medications    albuterol (2.5 MG/3ML) 0.083% nebulizer solution Commonly known as: PROVENTIL Take 3 mLs (2.5 mg total) by nebulization every 6 (six) hours as needed for up to 30 doses for wheezing or shortness of breath. What changed: Another medication with the same name was changed. Make sure you understand how and when to take each.   albuterol 108 (90 Base) MCG/ACT inhaler Commonly known as: VENTOLIN HFA INHALE TWO PUFFS INTO THE LUNGS EVERY FOUR HOURS AS NEEDED FOR WHEEZING OR SHORTNESS OF BREATH What changed: See the new instructions.   aspirin 81 MG chewable tablet Chew 1 tablet (81 mg total) by mouth 2 (two) times daily.   BOIL-EASE EX Apply 1  application. topically daily as needed (Boil).   carbamide peroxide 6.5 % OTIC solution Commonly known as: DEBROX Place 5 drops into both ears daily as needed (ears).   colestipol 1 g tablet Commonly known as: COLESTID TAKE 2 TABLETS BY MOUTH ONCE DAILY. DO NOT TAKE WITHIN TWO HOURS OF OTHER MEDS.   dextromethorphan-guaiFENesin 30-600 MG 12hr tablet Commonly known as: MUCINEX DM Take 1 tablet by mouth 2 (two) times daily for 10 days.   diazepam 5 MG tablet Commonly known as: VALIUM Take 5 mg by mouth See admin instructions. Take 5 mg 1 to 2 times as needed for anxiety.   diclofenac Sodium 1 % Gel Commonly known as: VOLTAREN Apply topically as needed.   docusate sodium 100 MG capsule Commonly known as: COLACE Take 1 capsule (100 mg total) by mouth 2 (two) times daily.   empagliflozin 25 MG Tabs tablet Commonly known as: JARDIANCE Take 25 mg by mouth daily.   furosemide 20 MG tablet Commonly known as: Lasix Take 1 tablet (20 mg total) by mouth daily as needed for fluid or edema. Take for 3 lb weight gain in 24 hours or 5 lb weight gain over 7 days.   hydrocortisone cream 1 % Apply 1 application topically daily as needed for itching.   Iron 325 (65 Fe) MG Tabs Take 1 tablet by mouth daily.   levothyroxine 112 MCG tablet Commonly  known as: SYNTHROID Take 112 mcg by mouth daily before breakfast.   lidocaine 5 % Commonly known as: Lidoderm Place 1 patch onto the skin daily. Remove & Discard patch within 12 hours or as directed by MD   losartan 25 MG tablet Commonly known as: COZAAR Take 0.5 tablets (12.5 mg total) by mouth daily.   metFORMIN 500 MG tablet Commonly known as: GLUCOPHAGE Take 500 mg by mouth daily with breakfast.   Multi Vitamin Tabs   neomycin-bacitracin-polymyxin Oint Commonly known as: NEOSPORIN Apply 1 application topically daily as needed for irritation or wound care.   Omega 3 1000 MG Caps   pantoprazole 40 MG tablet Commonly known as:  PROTONIX Take 40 mg by mouth daily.   PARoxetine 20 MG tablet Commonly known as: PAXIL Take 20 mg by mouth daily.   predniSONE 10 MG tablet Commonly known as: DELTASONE Take 4 tablets (40 mg total) by mouth daily for 5 days. What changed:  how much to take how to take this when to take this additional instructions   rosuvastatin 20 MG tablet Commonly known as: CRESTOR Take 20 mg by mouth daily as needed.   traZODone 100 MG tablet Commonly known as: DESYREL Take 100 mg by mouth at bedtime.   Trelegy Ellipta 100-62.5-25 MCG/ACT Aepb Generic drug: Fluticasone-Umeclidin-Vilant Inhale 1 puff into the lungs daily.        Discharge Assessment: Vitals:   03/18/22 0530 03/18/22 0906  BP: 115/70   Pulse: 75   Resp: 20   Temp: 98 F (36.7 C)   SpO2: 100% 98%   Skin clean, dry and intact without evidence of skin break down, no evidence of skin tears noted. IV catheter discontinued intact. Site without signs and symptoms of complications - no redness or edema noted at insertion site, patient denies c/o pain - only slight tenderness at site.  Dressing with slight pressure applied.  D/c Instructions-Education: Discharge instructions given to patient/family with verbalized understanding. D/c education completed with patient/family including follow up instructions, medication list, d/c activities limitations if indicated, with other d/c instructions as indicated by MD - patient able to verbalize understanding, all questions fully answered. Patient instructed to return to ED, call 911, or call MD for any changes in condition.  Patient escorted via [place], and D/C home via private auto.  Tsosie Billing, LPN 04/15/9020 11:55 AM

## 2022-03-19 ENCOUNTER — Telehealth: Payer: Self-pay | Admitting: Pulmonary Disease

## 2022-03-19 NOTE — Progress Notes (Signed)
Reviewed and agree with assessment/plan.   Chesley Mires, MD [facility] Pulmonary/Critical Care 03/19/2022, 1:48 PM Pager:  [phone]

## 2022-03-19 NOTE — Telephone Encounter (Signed)
I am okay with scheduling her for afternoon appointment in [place] office during the week of June 12.

## 2022-03-19 NOTE — Telephone Encounter (Signed)
Called and spoke with [name] (ok per dpr) and scheduled patient for tues 6/13 at 2:00. Nothing further needed

## 2022-03-19 NOTE — Telephone Encounter (Signed)
Pt needing hospital follow-up with Dr. [name].  Preferred not to go to [facility] office.  Only have 15 minute slots on the schedule this week.  Please advise.   Dr. [name] please advise, do you want patient added on/ booked ina  15  min slot for HFU or would you like her to be scheduled with next available which is around August.

## 2022-03-20 ENCOUNTER — Telehealth: Payer: Self-pay | Admitting: *Deleted

## 2022-03-20 LAB — CULTURE, BLOOD (ROUTINE X 2)
Culture: NO GROWTH
Culture: NO GROWTH
Special Requests: ADEQUATE

## 2022-03-20 NOTE — Telephone Encounter (Signed)
Transition Care Management Unsuccessful Follow-up Telephone Call  Date of discharge and from where:  03/18/22 [facility]  Attempts:  1st Attempt  Reason for unsuccessful TCM follow-up call:  reached patient via phone, states she is not available to complete assessment today. Requests call tomorrow.  States she has an appointment to see Dr Nevada Crane on Monday 03/26/22.  Kelli Churn RN, CCM, [place] [employer] Management Coordinator  [phone]

## 2022-03-21 ENCOUNTER — Other Ambulatory Visit: Payer: Self-pay | Admitting: Surgical

## 2022-03-21 DIAGNOSIS — S82041A Displaced comminuted fracture of right patella, initial encounter for closed fracture: Secondary | ICD-10-CM

## 2022-03-21 DIAGNOSIS — M810 Age-related osteoporosis without current pathological fracture: Secondary | ICD-10-CM

## 2022-03-26 ENCOUNTER — Telehealth: Payer: Self-pay

## 2022-03-26 ENCOUNTER — Telehealth: Payer: Self-pay | Admitting: Student

## 2022-03-26 ENCOUNTER — Encounter: Payer: Self-pay | Admitting: Cardiology

## 2022-03-26 ENCOUNTER — Ambulatory Visit: Payer: Medicare HMO | Admitting: Cardiology

## 2022-03-26 ENCOUNTER — Emergency Department (HOSPITAL_COMMUNITY): Payer: Medicare HMO

## 2022-03-26 ENCOUNTER — Inpatient Hospital Stay (HOSPITAL_COMMUNITY)
Admission: EM | Admit: 2022-03-26 | Discharge: 2022-04-02 | DRG: 193 | Disposition: A | Discharge status: Home / Self Care | Payer: Medicare HMO | Attending: Family Medicine | Admitting: Family Medicine

## 2022-03-26 ENCOUNTER — Telehealth: Payer: Self-pay | Admitting: Pulmonary Disease

## 2022-03-26 ENCOUNTER — Other Ambulatory Visit: Payer: Self-pay

## 2022-03-26 VITALS — BP 119/77 | HR 77 | Temp 96.6°F | Resp 17 | Ht 66.0 in | Wt 124.8 lb

## 2022-03-26 DIAGNOSIS — Z881 Allergy status to other antibiotic agents status: Secondary | ICD-10-CM

## 2022-03-26 DIAGNOSIS — E1142 Type 2 diabetes mellitus with diabetic polyneuropathy: Secondary | ICD-10-CM | POA: Diagnosis present

## 2022-03-26 DIAGNOSIS — R7989 Other specified abnormal findings of blood chemistry: Secondary | ICD-10-CM | POA: Diagnosis present

## 2022-03-26 DIAGNOSIS — J95811 Postprocedural pneumothorax: Secondary | ICD-10-CM | POA: Diagnosis not present

## 2022-03-26 DIAGNOSIS — Z955 Presence of coronary angioplasty implant and graft: Secondary | ICD-10-CM

## 2022-03-26 DIAGNOSIS — I447 Left bundle-branch block, unspecified: Secondary | ICD-10-CM | POA: Diagnosis present

## 2022-03-26 DIAGNOSIS — R627 Adult failure to thrive: Secondary | ICD-10-CM

## 2022-03-26 DIAGNOSIS — E119 Type 2 diabetes mellitus without complications: Secondary | ICD-10-CM | POA: Diagnosis not present

## 2022-03-26 DIAGNOSIS — R0609 Other forms of dyspnea: Secondary | ICD-10-CM | POA: Diagnosis not present

## 2022-03-26 DIAGNOSIS — J441 Chronic obstructive pulmonary disease with (acute) exacerbation: Secondary | ICD-10-CM | POA: Diagnosis present

## 2022-03-26 DIAGNOSIS — I959 Hypotension, unspecified: Secondary | ICD-10-CM | POA: Diagnosis present

## 2022-03-26 DIAGNOSIS — K219 Gastro-esophageal reflux disease without esophagitis: Secondary | ICD-10-CM | POA: Diagnosis present

## 2022-03-26 DIAGNOSIS — R0602 Shortness of breath: Secondary | ICD-10-CM | POA: Diagnosis not present

## 2022-03-26 DIAGNOSIS — R091 Pleurisy: Secondary | ICD-10-CM | POA: Diagnosis not present

## 2022-03-26 DIAGNOSIS — Z9079 Acquired absence of other genital organ(s): Secondary | ICD-10-CM

## 2022-03-26 DIAGNOSIS — C7931 Secondary malignant neoplasm of brain: Secondary | ICD-10-CM | POA: Diagnosis present

## 2022-03-26 DIAGNOSIS — E039 Hypothyroidism, unspecified: Secondary | ICD-10-CM | POA: Diagnosis present

## 2022-03-26 DIAGNOSIS — I2582 Chronic total occlusion of coronary artery: Secondary | ICD-10-CM | POA: Diagnosis present

## 2022-03-26 DIAGNOSIS — Z79890 Hormone replacement therapy (postmenopausal): Secondary | ICD-10-CM

## 2022-03-26 DIAGNOSIS — Z66 Do not resuscitate: Secondary | ICD-10-CM | POA: Diagnosis not present

## 2022-03-26 DIAGNOSIS — I5042 Chronic combined systolic (congestive) and diastolic (congestive) heart failure: Secondary | ICD-10-CM | POA: Diagnosis present

## 2022-03-26 DIAGNOSIS — Z7984 Long term (current) use of oral hypoglycemic drugs: Secondary | ICD-10-CM

## 2022-03-26 DIAGNOSIS — I509 Heart failure, unspecified: Secondary | ICD-10-CM | POA: Diagnosis not present

## 2022-03-26 DIAGNOSIS — I130 Hypertensive heart and chronic kidney disease with heart failure and stage 1 through stage 4 chronic kidney disease, or unspecified chronic kidney disease: Secondary | ICD-10-CM | POA: Diagnosis present

## 2022-03-26 DIAGNOSIS — Z7951 Long term (current) use of inhaled steroids: Secondary | ICD-10-CM

## 2022-03-26 DIAGNOSIS — C342 Malignant neoplasm of middle lobe, bronchus or lung: Secondary | ICD-10-CM | POA: Diagnosis not present

## 2022-03-26 DIAGNOSIS — J9611 Chronic respiratory failure with hypoxia: Secondary | ICD-10-CM | POA: Diagnosis not present

## 2022-03-26 DIAGNOSIS — J9621 Acute and chronic respiratory failure with hypoxia: Secondary | ICD-10-CM | POA: Diagnosis present

## 2022-03-26 DIAGNOSIS — J939 Pneumothorax, unspecified: Secondary | ICD-10-CM

## 2022-03-26 DIAGNOSIS — N179 Acute kidney failure, unspecified: Secondary | ICD-10-CM | POA: Diagnosis present

## 2022-03-26 DIAGNOSIS — K7460 Unspecified cirrhosis of liver: Secondary | ICD-10-CM | POA: Diagnosis present

## 2022-03-26 DIAGNOSIS — N183 Chronic kidney disease, stage 3 unspecified: Secondary | ICD-10-CM | POA: Diagnosis present

## 2022-03-26 DIAGNOSIS — J440 Chronic obstructive pulmonary disease with acute lower respiratory infection: Secondary | ICD-10-CM | POA: Diagnosis not present

## 2022-03-26 DIAGNOSIS — Z89111 Acquired absence of right hand: Secondary | ICD-10-CM

## 2022-03-26 DIAGNOSIS — N1831 Chronic kidney disease, stage 3a: Secondary | ICD-10-CM | POA: Diagnosis present

## 2022-03-26 DIAGNOSIS — Z90722 Acquired absence of ovaries, bilateral: Secondary | ICD-10-CM

## 2022-03-26 DIAGNOSIS — Z9221 Personal history of antineoplastic chemotherapy: Secondary | ICD-10-CM

## 2022-03-26 DIAGNOSIS — Z9981 Dependence on supplemental oxygen: Secondary | ICD-10-CM

## 2022-03-26 DIAGNOSIS — E1151 Type 2 diabetes mellitus with diabetic peripheral angiopathy without gangrene: Secondary | ICD-10-CM | POA: Diagnosis present

## 2022-03-26 DIAGNOSIS — D539 Nutritional anemia, unspecified: Secondary | ICD-10-CM | POA: Diagnosis present

## 2022-03-26 DIAGNOSIS — D509 Iron deficiency anemia, unspecified: Secondary | ICD-10-CM | POA: Diagnosis present

## 2022-03-26 DIAGNOSIS — I5020 Unspecified systolic (congestive) heart failure: Secondary | ICD-10-CM

## 2022-03-26 DIAGNOSIS — J189 Pneumonia, unspecified organism: Secondary | ICD-10-CM | POA: Diagnosis not present

## 2022-03-26 DIAGNOSIS — J181 Lobar pneumonia, unspecified organism: Secondary | ICD-10-CM | POA: Diagnosis not present

## 2022-03-26 DIAGNOSIS — J90 Pleural effusion, not elsewhere classified: Secondary | ICD-10-CM | POA: Diagnosis not present

## 2022-03-26 DIAGNOSIS — Z7982 Long term (current) use of aspirin: Secondary | ICD-10-CM

## 2022-03-26 DIAGNOSIS — I5032 Chronic diastolic (congestive) heart failure: Secondary | ICD-10-CM | POA: Insufficient documentation

## 2022-03-26 DIAGNOSIS — J9383 Other pneumothorax: Secondary | ICD-10-CM | POA: Diagnosis not present

## 2022-03-26 DIAGNOSIS — Z79891 Long term (current) use of opiate analgesic: Secondary | ICD-10-CM

## 2022-03-26 DIAGNOSIS — I5022 Chronic systolic (congestive) heart failure: Secondary | ICD-10-CM | POA: Diagnosis not present

## 2022-03-26 DIAGNOSIS — I252 Old myocardial infarction: Secondary | ICD-10-CM

## 2022-03-26 DIAGNOSIS — R846 Abnormal cytological findings in specimens from respiratory organs and thorax: Secondary | ICD-10-CM | POA: Diagnosis not present

## 2022-03-26 DIAGNOSIS — J84112 Idiopathic pulmonary fibrosis: Secondary | ICD-10-CM | POA: Diagnosis present

## 2022-03-26 DIAGNOSIS — R0902 Hypoxemia: Principal | ICD-10-CM | POA: Insufficient documentation

## 2022-03-26 DIAGNOSIS — Y95 Nosocomial condition: Secondary | ICD-10-CM | POA: Diagnosis present

## 2022-03-26 DIAGNOSIS — J9601 Acute respiratory failure with hypoxia: Secondary | ICD-10-CM | POA: Diagnosis not present

## 2022-03-26 DIAGNOSIS — E1122 Type 2 diabetes mellitus with diabetic chronic kidney disease: Secondary | ICD-10-CM | POA: Diagnosis present

## 2022-03-26 DIAGNOSIS — J849 Interstitial pulmonary disease, unspecified: Secondary | ICD-10-CM | POA: Diagnosis not present

## 2022-03-26 DIAGNOSIS — Z794 Long term (current) use of insulin: Secondary | ICD-10-CM | POA: Diagnosis not present

## 2022-03-26 DIAGNOSIS — I2510 Atherosclerotic heart disease of native coronary artery without angina pectoris: Secondary | ICD-10-CM | POA: Diagnosis present

## 2022-03-26 DIAGNOSIS — E782 Mixed hyperlipidemia: Secondary | ICD-10-CM | POA: Diagnosis present

## 2022-03-26 DIAGNOSIS — Z91048 Other nonmedicinal substance allergy status: Secondary | ICD-10-CM

## 2022-03-26 DIAGNOSIS — Z8719 Personal history of other diseases of the digestive system: Secondary | ICD-10-CM

## 2022-03-26 DIAGNOSIS — Z91041 Radiographic dye allergy status: Secondary | ICD-10-CM

## 2022-03-26 DIAGNOSIS — Z888 Allergy status to other drugs, medicaments and biological substances status: Secondary | ICD-10-CM

## 2022-03-26 DIAGNOSIS — I110 Hypertensive heart disease with heart failure: Secondary | ICD-10-CM | POA: Diagnosis not present

## 2022-03-26 DIAGNOSIS — D649 Anemia, unspecified: Secondary | ICD-10-CM

## 2022-03-26 DIAGNOSIS — Z20822 Contact with and (suspected) exposure to covid-19: Secondary | ICD-10-CM | POA: Diagnosis present

## 2022-03-26 DIAGNOSIS — I129 Hypertensive chronic kidney disease with stage 1 through stage 4 chronic kidney disease, or unspecified chronic kidney disease: Secondary | ICD-10-CM | POA: Diagnosis present

## 2022-03-26 DIAGNOSIS — Z9049 Acquired absence of other specified parts of digestive tract: Secondary | ICD-10-CM

## 2022-03-26 DIAGNOSIS — Z923 Personal history of irradiation: Secondary | ICD-10-CM

## 2022-03-26 DIAGNOSIS — Z91030 Bee allergy status: Secondary | ICD-10-CM

## 2022-03-26 DIAGNOSIS — R21 Rash and other nonspecific skin eruption: Secondary | ICD-10-CM | POA: Diagnosis not present

## 2022-03-26 DIAGNOSIS — J9690 Respiratory failure, unspecified, unspecified whether with hypoxia or hypercapnia: Secondary | ICD-10-CM | POA: Diagnosis not present

## 2022-03-26 DIAGNOSIS — R918 Other nonspecific abnormal finding of lung field: Secondary | ICD-10-CM | POA: Diagnosis not present

## 2022-03-26 DIAGNOSIS — Z880 Allergy status to penicillin: Secondary | ICD-10-CM

## 2022-03-26 DIAGNOSIS — F17210 Nicotine dependence, cigarettes, uncomplicated: Secondary | ICD-10-CM | POA: Diagnosis present

## 2022-03-26 DIAGNOSIS — Z91040 Latex allergy status: Secondary | ICD-10-CM

## 2022-03-26 DIAGNOSIS — Z79899 Other long term (current) drug therapy: Secondary | ICD-10-CM

## 2022-03-26 HISTORY — DX: Unspecified systolic (congestive) heart failure: I50.20

## 2022-03-26 HISTORY — DX: Non-ST elevation (NSTEMI) myocardial infarction: I21.4

## 2022-03-26 HISTORY — DX: Adult failure to thrive: R62.7

## 2022-03-26 LAB — COMPREHENSIVE METABOLIC PANEL
ALT: 13 U/L (ref 0–44)
AST: 14 U/L — ABNORMAL LOW (ref 15–41)
Albumin: 3.1 g/dL — ABNORMAL LOW (ref 3.5–5.0)
Alkaline Phosphatase: 53 U/L (ref 38–126)
Anion gap: 7 (ref 5–15)
BUN: 12 mg/dL (ref 8–23)
CO2: 34 mmol/L — ABNORMAL HIGH (ref 22–32)
Calcium: 9.6 mg/dL (ref 8.9–10.3)
Chloride: 98 mmol/L (ref 98–111)
Creatinine, Ser: 1.16 mg/dL — ABNORMAL HIGH (ref 0.44–1.00)
GFR, Estimated: 51 mL/min — ABNORMAL LOW (ref >60)
Glucose, Bld: 165 mg/dL — ABNORMAL HIGH (ref 70–99)
Potassium: 4.8 mmol/L (ref 3.5–5.1)
Sodium: 139 mmol/L (ref 135–145)
Total Bilirubin: 0.9 mg/dL (ref 0.3–1.2)
Total Protein: 6.4 g/dL — ABNORMAL LOW (ref 6.5–8.1)

## 2022-03-26 LAB — CBC WITH DIFFERENTIAL/PLATELET
Abs Immature Granulocytes: 0.03 10*3/uL (ref 0.00–0.07)
Basophils Absolute: 0 10*3/uL (ref 0.0–0.1)
Basophils Relative: 0 %
Eosinophils Absolute: 0.1 10*3/uL (ref 0.0–0.5)
Eosinophils Relative: 1 %
HCT: 35 % — ABNORMAL LOW (ref 36.0–46.0)
Hemoglobin: 10.3 g/dL — ABNORMAL LOW (ref 12.0–15.0)
Immature Granulocytes: 1 %
Lymphocytes Relative: 10 %
Lymphs Abs: 0.6 10*3/uL — ABNORMAL LOW (ref 0.7–4.0)
MCH: 28.5 pg (ref 26.0–34.0)
MCHC: 29.4 g/dL — ABNORMAL LOW (ref 30.0–36.0)
MCV: 96.7 fL (ref 80.0–100.0)
Monocytes Absolute: 0.5 10*3/uL (ref 0.1–1.0)
Monocytes Relative: 8 %
Neutro Abs: 5.1 10*3/uL (ref 1.7–7.7)
Neutrophils Relative %: 80 %
Platelets: 189 10*3/uL (ref 150–400)
RBC: 3.62 MIL/uL — ABNORMAL LOW (ref 3.87–5.11)
RDW: 16.2 % — ABNORMAL HIGH (ref 11.5–15.5)
WBC: 6.4 10*3/uL (ref 4.0–10.5)
nRBC: 0 % (ref 0.0–0.2)

## 2022-03-26 LAB — SARS CORONAVIRUS 2 BY RT PCR: SARS Coronavirus 2 by RT PCR: NEGATIVE

## 2022-03-26 LAB — BRAIN NATRIURETIC PEPTIDE: B Natriuretic Peptide: 996.4 pg/mL — ABNORMAL HIGH (ref 0.0–100.0)

## 2022-03-26 MED ORDER — VANCOMYCIN HCL 750 MG/150ML IV SOLN: 750.0000 mg | INTRAVENOUS | Status: DC

## 2022-03-26 MED ORDER — ROSUVASTATIN CALCIUM 20 MG PO TABS
20.0000 mg | ORAL_TABLET | Freq: Every day | ORAL | Status: DC
  Administered 2022-03-27 – 2022-04-02 (×7): 20 mg via ORAL
  Filled 2022-03-26 (×7): qty 1

## 2022-03-26 MED ORDER — FLUTICASONE FUROATE-VILANTEROL 100-25 MCG/ACT IN AEPB
1.0000 | INHALATION_SPRAY | Freq: Every day | RESPIRATORY_TRACT | Status: DC
  Administered 2022-03-27 – 2022-04-02 (×6): 1 via RESPIRATORY_TRACT
  Filled 2022-03-26: qty 28

## 2022-03-26 MED ORDER — PANTOPRAZOLE SODIUM 40 MG PO TBEC
40.0000 mg | DELAYED_RELEASE_TABLET | Freq: Every day | ORAL | Status: DC
  Administered 2022-03-27 – 2022-04-02 (×7): 40 mg via ORAL
  Filled 2022-03-26 (×7): qty 1

## 2022-03-26 MED ORDER — ADULT MULTIVITAMIN W/MINERALS CH: 1.0000 | ORAL_TABLET | Freq: Every day | ORAL | Status: DC

## 2022-03-26 MED ORDER — B COMPLEX-C PO TABS: 2.0000 | ORAL_TABLET | Freq: Every day | ORAL | Status: DC

## 2022-03-26 MED ORDER — VITAMIN D 25 MCG (1000 UNIT) PO TABS: 6000.0000 [IU] | ORAL_TABLET | Freq: Every day | ORAL | Status: DC

## 2022-03-26 MED ORDER — DOCUSATE SODIUM 100 MG PO CAPS
100.0000 mg | ORAL_CAPSULE | Freq: Two times a day (BID) | ORAL | Status: DC
  Administered 2022-03-26 – 2022-04-02 (×13): 100 mg via ORAL
  Filled 2022-03-26 (×13): qty 1

## 2022-03-26 MED ORDER — SODIUM CHLORIDE 0.9 % IV SOLN
2.0000 g | Freq: Two times a day (BID) | INTRAVENOUS | Status: AC
  Administered 2022-03-26 – 2022-03-31 (×10): 2 g via INTRAVENOUS
  Filled 2022-03-26 (×10): qty 12.5

## 2022-03-26 MED ORDER — FERROUS SULFATE 325 (65 FE) MG PO TABS
325.0000 mg | ORAL_TABLET | Freq: Every day | ORAL | Status: DC
  Administered 2022-03-27 – 2022-03-28 (×2): 325 mg via ORAL
  Filled 2022-03-26 (×2): qty 1

## 2022-03-26 MED ORDER — VANCOMYCIN HCL IN DEXTROSE 1-5 GM/200ML-% IV SOLN
1000.0000 mg | Freq: Once | INTRAVENOUS | Status: AC
  Administered 2022-03-26: 1000 mg via INTRAVENOUS
  Filled 2022-03-26: qty 200

## 2022-03-26 MED ORDER — OMEGA-3-ACID ETHYL ESTERS 1 G PO CAPS
1.0000 g | ORAL_CAPSULE | Freq: Two times a day (BID) | ORAL | Status: DC
  Administered 2022-03-27 – 2022-04-02 (×12): 1 g via ORAL
  Filled 2022-03-26 (×14): qty 1

## 2022-03-26 MED ORDER — OMEGA 3 1000 MG PO CAPS
1000.0000 mg | ORAL_CAPSULE | Freq: Every day | ORAL | Status: DC
Start: 2022-03-26 — End: 2022-03-26

## 2022-03-26 MED ORDER — COLESTIPOL HCL 1 G PO TABS
2.0000 g | ORAL_TABLET | Freq: Every day | ORAL | Status: DC
  Administered 2022-03-26 – 2022-04-02 (×7): 2 g via ORAL
  Filled 2022-03-26 (×9): qty 2

## 2022-03-26 MED ORDER — FUROSEMIDE 10 MG/ML IJ SOLN
40.0000 mg | Freq: Once | INTRAMUSCULAR | Status: AC
  Administered 2022-03-26: 40 mg via INTRAVENOUS
  Filled 2022-03-26: qty 4

## 2022-03-26 MED ORDER — DIAZEPAM 5 MG PO TABS: 2.5000 mg | ORAL_TABLET | Freq: Two times a day (BID) | ORAL | Status: DC | PRN

## 2022-03-26 MED ORDER — ENOXAPARIN SODIUM 40 MG/0.4ML IJ SOSY
40.0000 mg | PREFILLED_SYRINGE | INTRAMUSCULAR | Status: DC
  Administered 2022-03-26 – 2022-04-01 (×7): 40 mg via SUBCUTANEOUS
  Filled 2022-03-26 (×7): qty 0.4

## 2022-03-26 MED ORDER — COQ-10 100 MG PO CAPS: 100.0000 mg | ORAL_CAPSULE | Freq: Every day | ORAL | Status: DC

## 2022-03-26 MED ORDER — B COMPLEX VITAMINS PO CAPS: 2.0000 | ORAL_CAPSULE | Freq: Every day | ORAL | Status: DC

## 2022-03-26 MED ORDER — INSULIN ASPART 100 UNIT/ML IJ SOLN: 0.0000 [IU] | Freq: Three times a day (TID) | INTRAMUSCULAR | Status: DC

## 2022-03-26 MED ORDER — ASPIRIN 81 MG PO CHEW
81.0000 mg | CHEWABLE_TABLET | Freq: Two times a day (BID) | ORAL | Status: DC
  Administered 2022-03-26 – 2022-04-02 (×14): 81 mg via ORAL
  Filled 2022-03-26 (×14): qty 1

## 2022-03-26 MED ORDER — UMECLIDINIUM BROMIDE 62.5 MCG/ACT IN AEPB
1.0000 | INHALATION_SPRAY | Freq: Every day | RESPIRATORY_TRACT | Status: DC
  Administered 2022-03-27 – 2022-04-02 (×6): 1 via RESPIRATORY_TRACT
  Filled 2022-03-26: qty 7

## 2022-03-26 MED ORDER — TRAZODONE HCL 100 MG PO TABS
100.0000 mg | ORAL_TABLET | Freq: Every day | ORAL | Status: DC
  Administered 2022-03-26 – 2022-04-01 (×7): 100 mg via ORAL
  Filled 2022-03-26 (×5): qty 1
  Filled 2022-03-26: qty 2
  Filled 2022-03-26: qty 1

## 2022-03-26 MED ORDER — LEVOTHYROXINE SODIUM 112 MCG PO TABS
112.0000 ug | ORAL_TABLET | Freq: Every day | ORAL | Status: DC
  Administered 2022-03-27 – 2022-04-02 (×7): 112 ug via ORAL
  Filled 2022-03-26 (×8): qty 1

## 2022-03-26 MED ORDER — PAROXETINE HCL 20 MG PO TABS
20.0000 mg | ORAL_TABLET | Freq: Every day | ORAL | Status: DC
  Administered 2022-03-27 – 2022-04-02 (×7): 20 mg via ORAL
  Filled 2022-03-26 (×9): qty 1

## 2022-03-26 NOTE — ED Notes (Signed)
Patient transported to CT 

## 2022-03-26 NOTE — ED Notes (Signed)
Dr. Dominic Pea aware of pt being a hard stick. Dr is going to try to stick pt

## 2022-03-26 NOTE — Assessment & Plan Note (Signed)
Patient with history of COPD on 3 L of O2 at baseline, and ILD, and asthma, with recent hospitalization 6/1-6/4 for community-acquired pneumonia.  Patient reports feeling improved after hospitalization but notes feeling short of breath this morning.  Denies fever, cough, sputum production, but does appreciate increasing shortness of breath with activity level.  On admission patient satting greater than 92% on 3 L O2.  Chest x-ray concerning for pneumonia, CT chest demonstrating consolidation in the medial right middle lobe and inferior right upper lobe with partially loculated right-sided pleural effusion.  Patient was started on vancomycin and cefepime.  Given patient respiratory findings and lack of sputum production, or sputum purulence, less concern for COPD exacerbation. - Admit to FPTS, attending Dr. Mallie Mussel - Vancomycin per pharmacy - Cefepime per [place] per unit - PT/OT - DVT PPx Lovenox - Heart healthy/Carb modified diet

## 2022-03-26 NOTE — Progress Notes (Addendum)
Follow up visit  Subjective:   [name], female    DOB: Aug 29, 1951, 71 y.o.   MRN: [id number]   HPI   Chief Complaint  Patient presents with   Hospitalization Follow-up   Shortness of Breath    71 y.o. 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  Patient was seen in [facility] emergency room on 03/15/2022 with worsening dyspnea and having failed outpatient treatment for pneumonia.  She was found to have right-sided pneumonia on chest x-ray with parapneumonic effusion and background scarring or fibrosis.  BNP was elevated at 1600.  Echocardiogram showed EF 30-35% with abnormal septal motion abnormality consistent with left bundle branch block.  Patient is here today with her caregiver and friend [name]. Patient has had worsening dyspnea since hospital discharge. This morning, her O2 sat was down to 50s with minimal ambulation at home. She remains on 3 L supplemental oxygen. She denies any angina with exertion, but does report pleuritic right sided chest pain.  On a separate note, she has had decrease in appetite, and weight loss. She denies any melena.   She was scheduled to undergo lung and brain scan at the end of this month for surveillance of her cancer.   Oncologist Dr [name] (10/2021):  This is a very pleasant 71 years old white female recently diagnosed with extensive stage small cell lung cancer (T3, N2, M1 C) presented with right middle lobe lung mass in addition to right hilar and low right paratracheal lymphadenopathy in addition to tiny solitary brain metastasis diagnosed in August 2021. The patient is currently undergoing systemic chemotherapy with carboplatin for AUC of 5 on day 1, etoposide 100 mg/M2  with Neulasta support.  Status post 4 cycles.   She had evidence of metastatic disease to the brain and she underwent whole brain irradiation under the care of Dr. [name]. The patient is currently on observation and she is feeling fine today with no concerning complaints. She had repeat CT scan of the chest, abdomen pelvis performed recently.  I personally and independently reviewed the scan and discussed the results with the patient and her caregiver. Her scan showed no concerning findings for disease progression. I recommended for her to continue on observation with repeat CT scan of the chest, abdomen pelvis in 6 months. For the liver cirrhosis, she will continue her routine follow-up visit by her primary care physician. For the history of COPD and suspicious interstitial lung disease, she is followed by Dr. [name]. The patient was advised to call immediately if she has any other concerning symptoms in the interval. The patient voices understanding of current disease status and treatment options and is in agreement with the current care plan.    Current Outpatient Medications:    albuterol (PROVENTIL) (2.5 MG/3ML) 0.083% nebulizer solution, Take 3 mLs (2.5 mg total) by nebulization every 6 (six) hours as needed for up to 30 doses for wheezing or shortness of breath., Disp: 90 mL, Rfl: 3   albuterol (VENTOLIN HFA) 108 (90 Base) MCG/ACT inhaler, INHALE TWO PUFFS INTO THE LUNGS EVERY FOUR HOURS AS NEEDED FOR WHEEZING OR SHORTNESS OF BREATH (Patient taking differently: Inhale 2 puffs into the lungs every 4 (four) hours as needed for wheezing or shortness of breath.), Disp: 18 g, Rfl: 5  aspirin 81 MG chewable tablet, Chew 1 tablet (81 mg total) by mouth 2 (two) times daily. (Patient not taking: Reported on 03/15/2022), Disp: 60 tablet, Rfl: 0   Benzocaine (BOIL-EASE EX), Apply 1 application. topically daily as needed (Boil)., Disp: , Rfl:    carbamide peroxide (DEBROX) 6.5 % OTIC solution, Place 5 drops  into both ears daily as needed (ears)., Disp: , Rfl:    colestipol (COLESTID) 1 g tablet, TAKE 2 TABLETS BY MOUTH ONCE DAILY. DO NOT TAKE WITHIN TWO HOURS OF OTHER MEDS., Disp: 180 tablet, Rfl: 1   dextromethorphan-guaiFENesin (MUCINEX DM) 30-600 MG 12hr tablet, Take 1 tablet by mouth 2 (two) times daily for 10 days., Disp: 20 tablet, Rfl: 0   diazepam (VALIUM) 5 MG tablet, Take 5 mg by mouth See admin instructions. Take 5 mg 1 to 2 times as needed for anxiety., Disp: , Rfl:    diclofenac Sodium (VOLTAREN) 1 % GEL, Apply topically as needed., Disp: , Rfl:    docusate sodium (COLACE) 100 MG capsule, Take 1 capsule (100 mg total) by mouth 2 (two) times daily., Disp: 10 capsule, Rfl: 0   empagliflozin (JARDIANCE) 25 MG TABS tablet, Take 25 mg by mouth daily., Disp: , Rfl:    Ferrous Sulfate (IRON) 325 (65 Fe) MG TABS, Take 1 tablet by mouth daily., Disp: , Rfl:    Fluticasone-Umeclidin-Vilant (TRELEGY ELLIPTA) 100-62.5-25 MCG/ACT AEPB, Inhale 1 puff into the lungs daily., Disp: 60 each, Rfl: 0   furosemide (LASIX) 20 MG tablet, Take 1 tablet (20 mg total) by mouth daily as needed for fluid or edema. Take for 3 lb weight gain in 24 hours or 5 lb weight gain over 7 days., Disp: 30 tablet, Rfl: 0   hydrocortisone cream 1 %, Apply 1 application topically daily as needed for itching., Disp: , Rfl:    levothyroxine (SYNTHROID) 112 MCG tablet, Take 112 mcg by mouth daily before breakfast., Disp: , Rfl:    lidocaine (LIDODERM) 5 %, Place 1 patch onto the skin daily. Remove & Discard patch within 12 hours or as directed by MD (Patient not taking: Reported on 03/15/2022), Disp: 30 patch, Rfl: 0   losartan (COZAAR) 25 MG tablet, Take 0.5 tablets (12.5 mg total) by mouth daily., Disp: 15 tablet, Rfl: 0   metFORMIN (GLUCOPHAGE) 500 MG tablet, Take 500 mg by mouth daily with breakfast., Disp: , Rfl:    Multiple Vitamin (MULTI VITAMIN) TABS, , Disp: , Rfl:    neomycin-bacitracin-polymyxin (NEOSPORIN) OINT, Apply 1  application topically daily as needed for irritation or wound care., Disp: , Rfl:    Omega 3 1000 MG CAPS, , Disp: , Rfl:    pantoprazole (PROTONIX) 40 MG tablet, Take 40 mg by mouth daily., Disp: , Rfl:    PARoxetine (PAXIL) 20 MG tablet, Take 20 mg by mouth daily., Disp: , Rfl:    rosuvastatin (CRESTOR) 20 MG tablet, Take 20 mg by mouth daily as needed., Disp: , Rfl:    traZODone (DESYREL) 100 MG tablet, Take 100 mg by mouth at bedtime., Disp: , Rfl:     Cardiovascular & other pertient studies:  EKG 03/26/2022: Sinus rhythm 79 bpm  Left bundle branch block  Echocardiogram 03/16/2022:  1. Limited study, only parasternal images and few apical images as  patient declined further imaging   2. Left ventricular ejection fraction, by estimation, is 30 to 35%. The  left ventricle has moderately decreased function. The left ventricle  demonstrates regional wall motion abnormalities. Abnormal (paradoxical)  septal motion, consistent with left bundle branch block.      The left ventricular internal cavity size was moderately dilated. Left  ventricular diastolic parameters are indeterminate.   3. Right ventricular systolic function is normal. The right ventricular  size is mildly enlarged.   4. The mitral valve is normal in structure. Trivial mitral valve  regurgitation. No evidence of mitral stenosis.   5. The aortic valve is tricuspid. There is mild calcification of the  aortic valve. Aortic valve regurgitation is not visualized. Unable to  assess for aortic stenosis, gradients were not measured   CXR 03/15/2022: 1. Continued right-sided pneumonia and parapneumonic effusion. 2. Chronic background scarring and fibrosis.    MRI brain 11/10/2021: 1. Unchanged 6 mm left cerebellar metastasis. 2. No evidence of new intracranial metastases. 3. Progressive post radiation changes in the cerebral white matter.  CT Chest 10/20/2021: 1. Continued decrease in size of treated lesion within the  right middle lobe. No new or progressive disease identified. 2. Morphologic features of the liver suggestive of cirrhosis. 3. Chronic interstitial lung disease is identified compatible with pulmonary fibrosis. More definitive characterization could be obtained with high-resolution CT of the chest if clinically indicated. 4. Coronary artery atherosclerotic calcifications. 5. Aortic Atherosclerosis (ICD10-I70.0) and Emphysema (ICD10-J43.9).      DVT US 07/2020: Left:  No evidence of deep vein thrombosis in the upper extremity. 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      Coronary angiography 12/24/2019: LM: Normal LAD: Minimal luminal irregularities LCx: Prox LCx 30%, prox 50% OM1 stenosis      RCA: Prox RCA CTO with right-to right bridging collaterals, and left-to-right collaterals   LVEDP 28 mmHg. Hb 7.9 g/dl.    Recent labs: 03/18/2022: Glucose 143, BUN/Cr 32/1.01. EGFR 60. Na/K 140/4.1.  H/H 9.1/30.8. MCV 96. Platelets 202 HbA1C 5.8%   Review of Systems  Constitutional: Positive for decreased appetite and weight loss.  Cardiovascular:  Positive for chest pain (Right sided, pleuritic) and dyspnea on exertion. Negative for leg swelling, palpitations and syncope.         Vitals:   03/26/22 1046  BP: 119/77  Pulse: 77  Resp: 17  Temp: (!) 96.6 F (35.9 C)  SpO2: 99%     Body mass index is 20.14 kg/m. Filed Weights   03/26/22 1046  Weight: 124 lb 12.8 oz (56.6 kg)     Objective:   Physical Exam Vitals and nursing note reviewed.  Constitutional:      General: She is not in acute distress.    Appearance: She is underweight.  Neck:     Vascular: No JVD.  Cardiovascular:     Rate and Rhythm: Normal rate and regular rhythm.     Heart sounds: Normal heart sounds. No murmur heard. Pulmonary:     Effort: Pulmonary effort is normal.     Breath sounds: Normal breath sounds. No wheezing or  rales.            Assessment & Recommendations:    71 y.o. Caucasian female  with CAD (RCA CTO), HFrEF (EF 30-35% in 03/2022), controlled hypertension, hyperlipidemia, type 2 diabetes mellitus, anemia w/ h/o GI Bleed,  liver cirrhosis, hypothyroidism, COPD/asthma, depression, small cell lung cancer with recurrent solitary brain meastasis, s/p palliative chemo and radiotherapy, now with recent pneumonia, worsening exertional dyspnea  Exertional dyspnea: Likely multifactorial-including recent pneumonia, parapneumonic effusion., COPD/asthma, h/o lung cancer, as well as new diagnosis of systolic  heart failure. Clinically, she appears fairly euvolemic, but was noted to have elevated BNP at [facility] even in absence of leg edema. While her O2 sats are normal at rest on 3 L O2, I am concerned about her profound hypoxia on ambulation noted at home. She lives alone, with her caregiver, friend [name] currently spending a lot of time with her. I am concerned that at her current health situation, she is at high risk for further decompensation at home. I recommend ER evaluation and possible hospital admission for further workup including chest imaging.   HFrEF: Clinically euvolemic. Etiology could be CAD, as well as chest radiation. She currently does not have any angina symptoms. She is known to have mid RCA CTO (Cath 2021), that was medically treated given her comorbidities as well h/o GI bleed then. Given her GI bleeding history, anemia, multiple comorbidities, and failure to thrive, she remains a poor candidate for any form of revascularization, even if she were to have obstructive CAD. I do not recommend diagnostic coronary angiogram. I recommend medical therapy alone for her cardiomyopathy. Continue losartan. Based on rest of the workup, will consider changing to [facility]. Hold BB for now until further workup is completed to ensure not in acute decompensated heart failure. Continue  Jardiance.  CAD: As above. Continue Aspirin, statin.  I recommend ER evaluation and possible hospital admission for further workup including chest imaging.  I anticipate patient will be admitted to medicine service. I will continue to follow in consultative role.   Of note, patient is DNR by her own wishes.   I spoke with emergency room PA and alerted them about patient's anticipated visit to the ER.  F/u in 6 weeks  Shawnay Bramel Esther Hardy, MD [facility] Cardiovascular. PA Pager: [phone] Office: [phone]

## 2022-03-26 NOTE — Assessment & Plan Note (Deleted)
Creatinine is improved to 1.19. Baseline ~0.9 -Avoid nephrotoxic agent -Daily BMP

## 2022-03-26 NOTE — Telephone Encounter (Signed)
Patient's caregiver [name] called the office with concerns that patient's home oxygen saturations is 70-74% this morning despite use of home oxygen at 3 L/min.  Patient has had worsening shortness of breath over the last 2 to 3 days.  Denies swelling or weight gain.  Patient was recently admitted with pneumonia and acute heart failure.  Advised patient's caregiver that I recommend patient be further evaluated in the emergency department for volume overload. Patient's caregiver was agreeable.

## 2022-03-26 NOTE — ED Triage Notes (Signed)
Pt with hx of lung CA, CHF, and recent admission for PNA here for eval of sob x 1 week. Pt wears 3L O2 at home. Went to PCP today and advised to come to ED. Family reports SpO2 drops to mid 50% when she gets up, even while wearing O2.

## 2022-03-26 NOTE — Assessment & Plan Note (Addendum)
Stable -Hold home meds, consider restarting when appropriate

## 2022-03-26 NOTE — Assessment & Plan Note (Deleted)
Stable on baseline home 3L [place] with reassuring 02 sats. Home meds include Trelegy Ellipta, albuterol  -Continue Trellergy -O2 sats 88-92%

## 2022-03-26 NOTE — Assessment & Plan Note (Deleted)
Small cell lung cancer  diagnosed 2021 with metatesis to brain. She is s/p 4 cycles of chemotherapy, with concurrent whole brain radiation treatment completed 09/15/20. In remission since June 2022.

## 2022-03-26 NOTE — Progress Notes (Signed)
Pharmacy Antibiotic Note  [name] is a 71 y.o. female for which pharmacy has been consulted for cefepime and vancomycin dosing for pneumonia.  Patient with a history of DM, HTN, COPD, interstitial lung disease, small cell lung cancer s/p radiation, HF. Patient presenting with DOB. Recent admission for CAP.  SCr 1.16 WBC 6.4; T 97.3 F HR 84>70; RR 20>21  Plan: Cefepime 2g q12hr Vancomycin 1000 mg given in ED then 750 mg q24hr (eAUC 486) unless change in renal function Trend WBC, Fever, Renal function, & Clinical course F/u cultures, clinical course, WBC, fever De-escalate when able Levels at steady state F/u MRSA PCR     Temp (24hrs), Avg:97 F (36.1 C), Min:96.6 F (35.9 C), Max:97.3 F (36.3 C)  Recent Labs  Lab 03/26/22 1333  WBC 6.4  CREATININE 1.16*    Estimated Creatinine Clearance: 40.3 mL/min (A) (by C-G formula based on SCr of 1.16 mg/dL (H)).    Allergies  Allergen Reactions   Bee Venom Shortness Of Breath and Swelling   Iohexol Shortness Of Breath and Other (See Comments)    CHEST TIGHTNESS and BREATHING PROBLEMS- NEEDS PRE-MEDS 13 hours beforehand-  premeds given 06/08/2020 without incident    Tape Other (See Comments)    Tears the skin!!   Betadine [Povidone Iodine] Rash   Ciprofloxacin Rash   Latex Rash and Other (See Comments)    NO latex gloves!!    Penicillins Swelling    Did it involve swelling of the face/tongue/throat, SOB, or low BP? Yes Did it involve sudden or severe rash/hives, skin peeling, or any reaction on the inside of your mouth or nose? No Did you need to seek medical attention at a hospital or doctor's office? Yes When did it last happen?      1996 If all above answers are "NO", may proceed with cephalosporin use.   Povidone-Iodine Rash    Antimicrobials this admission: vancomycin 6/12 >>  cefepime 6/12 >>  Microbiology results: Pending  Thank you for allowing pharmacy to be a part of this patient's  care.  Lorelei Pont, PharmD, BCPS 03/26/2022 6:33 PM ED Clinical Pharmacist -  [phone]

## 2022-03-26 NOTE — Assessment & Plan Note (Addendum)
Very well controlled, last A1c 5.8% -Continue home Jardiance -Holding home Metformin -Glucose monitoring on daily BMP only

## 2022-03-26 NOTE — Telephone Encounter (Signed)
Looks like she is still being assessed in ER at [facility].  If she needs hospital admission, then the hospitalist will likely admitted her and can then arrange for pulmonary consult.  If she does get admitted to the th hospital, then I will let the pulmonary team in [facility] know to see her as a consult.

## 2022-03-26 NOTE — Assessment & Plan Note (Deleted)
[name] is a 71 y.o. female presenting with hypoxia. Differential for this patient's presentation of this includes pneumonia, CHF exacerbation, PE, and metastatic disease. PE less likely as patient has had symptoms of hypoxia that are positional, with no cough, chest pain, or leg swelling. Patient seen by oncology in January noting no progression/developemnt of cancer, making malignancy less likely. Patient hypoxia likely multifactorial with some component of pneumonia and chf exacerbation. Pneumonia most likely given CT chest findings and respiratory status. Patient could also be suffering for CHF exacerbation given BNP and lung exam.

## 2022-03-26 NOTE — ED Notes (Signed)
RN gave pt sandwich and water

## 2022-03-26 NOTE — Assessment & Plan Note (Signed)
Patient taking synthroid 112 mcg daily -continue home med

## 2022-03-26 NOTE — ED Provider Notes (Addendum)
[name] Provider Note   CSN: [id number] Arrival date & time: 03/26/22  1241     History  Chief Complaint  Patient presents with   Shortness of Breath    [name] is a 71 y.o. female hx of DM, HTN, COPD on 3 L nasal cannula, interstitial lung disease, small cell lung cancer status postradiation, recent diagnosis of heart failure, here presenting with shortness of breath.  Patient was recently admitted for pneumonia and had an echo that showed EF of 35%.  Patient was diuresed in the hospital and was discharged on prednisone taper then given a course of Lasix 20 mg for 5 days.  Patient was told to follow-up with pulmonology and cardiology.  Patient followed up with Dr. Virgina Jock today.  She states that she is on 3 L nasal cannula all the time and with minimal exertion, she would desat into the 50s.  Patient denies any fevers or cough.  States that her leg swelling is stable.   The history is provided by the patient.       Home Medications Prior to Admission medications   Medication Sig Start Date End Date Taking? Authorizing Provider  albuterol (PROVENTIL) (2.5 MG/3ML) 0.083% nebulizer solution Take 3 mLs (2.5 mg total) by nebulization every 6 (six) hours as needed for up to 30 doses for wheezing or shortness of breath. 11/14/21   Chesley Mires, MD  albuterol (VENTOLIN HFA) 108 (90 Base) MCG/ACT inhaler INHALE TWO PUFFS INTO THE LUNGS EVERY FOUR HOURS AS NEEDED FOR WHEEZING OR SHORTNESS OF BREATH Patient taking differently: Inhale 2 puffs into the lungs every 4 (four) hours as needed for wheezing or shortness of breath. 01/17/22   Chesley Mires, MD  aspirin 81 MG chewable tablet Chew 1 tablet (81 mg total) by mouth 2 (two) times daily. 12/28/21   Magnant, Charles L, PA-C  b complex vitamins capsule Take 1 capsule by mouth daily.    [provider]  Benzocaine (BOIL-EASE EX) Apply 1 application. topically daily as needed (Boil).     [provider]  carbamide peroxide (DEBROX) 6.5 % OTIC solution Place 5 drops into both ears daily as needed (ears).    [provider]  Cholecalciferol (VITAMIN D3) 50 MCG (2000 UT) capsule Take 1 capsule by mouth daily.    [provider]  Coenzyme Q10 (COQ-10 PO) Take 100 mg by mouth daily.    [provider]  colestipol (COLESTID) 1 g tablet TAKE 2 TABLETS BY MOUTH ONCE DAILY. DO NOT TAKE WITHIN TWO HOURS OF OTHER MEDS. 03/05/22   Erenest Rasher, PA-C  dextromethorphan-guaiFENesin [facility] DM) 30-600 MG 12hr tablet Take 1 tablet by mouth 2 (two) times daily for 10 days. 03/17/22 03/27/22  Manuella Ghazi, Pratik D, DO  diazepam (VALIUM) 5 MG tablet Take 5 mg by mouth See admin instructions. Take 5 mg 1 to 2 times as needed for anxiety. 03/13/22   [provider]  diclofenac Sodium (VOLTAREN) 1 % GEL Apply topically as needed.    [provider]  docusate sodium (COLACE) 100 MG capsule Take 1 capsule (100 mg total) by mouth 2 (two) times daily. 12/28/21   Magnant, Charles L, PA-C  empagliflozin (JARDIANCE) 25 MG TABS tablet Take 25 mg by mouth daily.    [provider]  Ferrous Sulfate (IRON) 325 (65 Fe) MG TABS Take 1 tablet by mouth daily.    [provider]  Fluticasone-Umeclidin-Vilant (TRELEGY ELLIPTA) 100-62.5-25 MCG/ACT AEPB Inhale 1  puff into the lungs daily. 03/09/22   Cobb, Karie Schwalbe, NP  furosemide (LASIX) 20 MG tablet Take 1 tablet (20 mg total) by mouth daily as needed for fluid or edema. Take for 3 lb weight gain in 24 hours or 5 lb weight gain over 7 days. 03/18/22 04/17/22  Manuella Ghazi, Pratik D, DO  hydrocortisone cream 1 % Apply 1 application topically daily as needed for itching.    [provider]  levothyroxine (SYNTHROID) 112 MCG tablet Take 112 mcg by mouth daily before breakfast.    [provider]  lidocaine (LIDODERM) 5 % Place 1 patch onto the skin daily. Remove & Discard patch within 12 hours or as  directed by MD 03/09/22   Belenda Cruise, Karie Schwalbe, NP  losartan (COZAAR) 25 MG tablet Take 0.5 tablets (12.5 mg total) by mouth daily. 03/18/22 04/17/22  Manuella Ghazi, Pratik D, DO  metFORMIN (GLUCOPHAGE) 500 MG tablet Take 500 mg by mouth daily with breakfast. 09/14/21   [provider]  Multiple Vitamin (MULTI VITAMIN) TABS     [provider]  neomycin-bacitracin-polymyxin (NEOSPORIN) OINT Apply 1 application topically daily as needed for irritation or wound care.    [provider]  NON [place]    [provider]  Omega 3 1000 MG CAPS     [provider]  pantoprazole (PROTONIX) 40 MG tablet Take 40 mg by mouth daily. 09/29/19   [provider]  PARoxetine (PAXIL) 20 MG tablet Take 20 mg by mouth daily. 01/12/21   [provider]  rosuvastatin (CRESTOR) 20 MG tablet Take 20 mg by mouth daily as needed. 01/12/21   [provider]  traZODone (DESYREL) 100 MG tablet Take 100 mg by mouth at bedtime. 01/12/22   [provider]      Allergies    Bee venom, Iohexol, Tape, Betadine [povidone iodine], Ciprofloxacin, Latex, Penicillins, and Povidone-iodine    Review of Systems   Review of Systems  Respiratory:  Positive for shortness of breath.   All other systems reviewed and are negative.   Physical Exam Updated Vital Signs BP (!) 108/57   Pulse 84   Temp (!) 97.3 F (36.3 C) (Oral)   Resp 20   SpO2 98%  Physical Exam Vitals and nursing note reviewed.  Constitutional:      Comments: Chronically ill  HENT:     Head: Normocephalic.  Eyes:     Extraocular Movements: Extraocular movements intact.     Pupils: Pupils are equal, round, and reactive to light.  Cardiovascular:     Rate and Rhythm: Normal rate and regular rhythm.  Pulmonary:     Comments: Slightly tachypneic.  Crackles on the right base Abdominal:     General: Bowel sounds are normal.     Palpations: Abdomen is soft.  Musculoskeletal:         General: Normal range of motion.     Cervical back: Normal range of motion and neck supple.     Comments: Trace pedal edema  Skin:    General: Skin is warm.     Capillary Refill: Capillary refill takes less than 2 seconds.  Neurological:     General: No focal deficit present.     Mental Status: She is oriented to person, place, and time.  Psychiatric:        Mood and Affect: Mood normal.        Behavior: Behavior normal.     ED Results / Procedures / Treatments  Labs (all labs ordered are listed, but only abnormal results are displayed) Labs Reviewed  CBC WITH DIFFERENTIAL/PLATELET - Abnormal; Notable for the following components:      Result Value   RBC 3.62 (*)    Hemoglobin 10.3 (*)    HCT 35.0 (*)    MCHC 29.4 (*)    RDW 16.2 (*)    Lymphs Abs 0.6 (*)    All other components within normal limits  BRAIN NATRIURETIC PEPTIDE - Abnormal; Notable for the following components:   B Natriuretic Peptide 996.4 (*)    All other components within normal limits  COMPREHENSIVE METABOLIC PANEL - Abnormal; Notable for the following components:   CO2 34 (*)    Glucose, Bld 165 (*)    Creatinine, Ser 1.16 (*)    Total Protein 6.4 (*)    Albumin 3.1 (*)    AST 14 (*)    GFR, Estimated 51 (*)    All other components within normal limits  SARS CORONAVIRUS 2 BY RT PCR    EKG EKG Interpretation  Date/Time:  Monday March 26 2022 13:01:35 EDT Ventricular Rate:  85 PR Interval:  134 QRS Duration: 138 QT Interval:  400 QTC Calculation: 476 R Axis:   13 Text Interpretation: Normal sinus rhythm Non-specific intra-ventricular conduction block Minimal voltage criteria for LVH, may be normal variant ( Cornell product ) T wave abnormality, consider lateral ischemia Abnormal ECG When compared with ECG of 15-Mar-2022 17:23, No significant change since last tracing Confirmed by Wandra Arthurs [phone]) on 03/26/2022 4:22:38 PM  Radiology DG Chest 2 View  Result Date: 03/26/2022 CLINICAL  DATA:  Shortness of breath EXAM: CHEST - 2 VIEW COMPARISON:  03/15/2022 FINDINGS: Bilateral diffuse interstitial thickening consistent with underlying chronic interstitial lung disease. More focal right lower lobe and right middle lobe airspace disease concerning for pneumonia. Trace right pleural effusion. No left pleural effusion. No pneumothorax. Stable cardiomediastinal silhouette. No aggressive osseous lesion. IMPRESSION: 1. Right lower lobe and right middle lobe pneumonia superimposed upon chronic interstitial lung disease. Overall, no significant interval change. Electronically Signed   By: Kathreen Devoid M.D.   On: 03/26/2022 14:50    Procedures Procedures    CRITICAL CARE Performed by: Wandra Arthurs   Total critical care time: 30 minutes  Critical care time was exclusive of separately billable procedures and treating other patients.  Critical care was necessary to treat or prevent imminent or life-threatening deterioration.  Critical care was time spent personally by me on the following activities: development of treatment plan with patient and/or surrogate as well as nursing, discussions with consultants, evaluation of patient's response to treatment, examination of patient, obtaining history from patient or surrogate, ordering and performing treatments and interventions, ordering and review of laboratory studies, ordering and review of radiographic studies, pulse oximetry and re-evaluation of patient's condition.  Angiocath insertion Performed by: Wandra Arthurs  Consent: Verbal consent obtained. Risks and benefits: risks, benefits and alternatives were discussed Time out: Immediately prior to procedure a "time out" was called to verify the correct patient, procedure, equipment, support staff and site/side marked as required.  Preparation: Patient was prepped and draped in the usual sterile fashion.  Vein Location: L brachial  Ultrasound Guided  Gauge: 20 long   Normal blood  return and flush without difficulty Patient tolerance: Patient tolerated the procedure well with no immediate complications.    Medications Ordered in ED Medications  furosemide (LASIX) injection 40 mg (has no administration in time range)  ED Course/ Medical Decision Making/ A&P                           Medical Decision Making [name] is a 71 y.o. female here presenting with shortness of breath.  Patient is on 3 L nasal cannula currently.  Her oxygen level is 98%.  Since patient desats easily to the 50s, I am concerned for possible pneumonia versus COPD versus worsening heart failure.  Patient has a contrast dye allergy.  Plan to get CT chest noncontrast.  We will also get CBC and BMP and BNP.  Patient would likely benefit from diuresis if her BNP is persistently elevated.  6:18 PM I reviewed patient's labs and independently reviewed her imaging.  Patient's white blood cell count is normal.  Patient's BNP remains elevated at around 1000.  Her BNP was 1600 last week.  Her chest x-ray showed persistent right lower lobe and right lower lobe pneumonia.  I perform a noncontrast CT scan that showed new dense airspace consolidation in the right middle lobe with new moderate right pleural effusion that is partially loculated.  Concern for possible parapneumonic effusion.  I ordered lactate and cultures and will give another course of IV antibiotics.  At this point patient will be admitted for hypoxia likely multifactorial.  Patient likely has CHF and also pneumonia with pleural effusion.  I messaged Dr. Virgina Jock from cardiology and he will see patient as consult.   Problems Addressed: Congestive heart failure, unspecified HF chronicity, unspecified heart failure type [facility]): acute illness or injury HCAP (healthcare-associated pneumonia): acute illness or injury Hypoxia: acute illness or injury Pleural effusion: acute illness or injury  Amount and/or Complexity of Data Reviewed Labs:  ordered. Decision-making details documented in ED Course. Radiology: ordered and independent interpretation performed. Decision-making details documented in ED Course. ECG/medicine tests: ordered and independent interpretation performed. Decision-making details documented in ED Course.  Risk Prescription drug management. Decision regarding hospitalization.    Final Clinical Impression(s) / ED Diagnoses Final diagnoses:  None    Rx / DC Orders ED Discharge Orders     None         Drenda Freeze, MD 03/26/22 Ladoris Gene    Drenda Freeze, MD 03/26/22 1931

## 2022-03-26 NOTE — Assessment & Plan Note (Signed)
Patient taking Protonix 40 mg daily -continue home meds

## 2022-03-26 NOTE — Telephone Encounter (Signed)
Pt's friend called requesting that Dr. [name] and Dr. [name] be made aware that pt was admitted to [facility] for heart failure, pneumonia and respiratory failure.   Routed to providers.

## 2022-03-26 NOTE — Telephone Encounter (Signed)
Called and spoke with [name] who states that patient is in the hospital at [facility], admitted by cardiologist when she went in for OV today.  Pt's friend [name] stated that cardiologist Dr. Virgina Jock was wanting Dr. [name] to reach out to him and consult with them as well as far as any testing that would need to be done.     Please advise.

## 2022-03-26 NOTE — Assessment & Plan Note (Signed)
Patient has been smoking since she was 16, with history of smoking a carton a month. Patient now smokes infrequently, 1 cigarette every 2-3 days.

## 2022-03-26 NOTE — Assessment & Plan Note (Signed)
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 RCA Chronic Total Occlusion (Cath 2021), that was medically treated given her comorbidities as well h/o GI bleed then. Home meds include ASA 81, and crestor 20 mg -Continue home meds

## 2022-03-26 NOTE — Assessment & Plan Note (Addendum)
Hgb 9.7 this am. s/p IV iron (ferric gluconate 260m daily x3 days 6/14-6/17) -continue home ferrous sulfate 3220mdaily -daily CBC -transfusion threshold 8

## 2022-03-26 NOTE — Assessment & Plan Note (Addendum)
Unfortunately strict UOP not recorded, but weight is stable.  Appears euvolemic on exam. -Cardiology following, appreciate recommendations -Hold home Lasix for now -Strict I's and O's, daily weights -Jardiance 52m daily -Continue losartan 12.5 mg -hold BB per cards, to restart outpatient

## 2022-03-26 NOTE — H&P (Addendum)
Hospital Admission History and Physical Service Pager: [phone]  Patient name: [name] Medical record number: [id number] Date of Birth: May 01, 1951 Age: 71 y.o. Gender: female  Primary Care Provider: [name], MD Consultants: Cardiology Code Status: DNR  Preferred Emergency Contact: Alexis Frock [phone]  Chief Complaint: hypoxia  Assessment and Plan: [name] is a 71 y.o. female presenting with hypoxia. Differential for this patient's presentation of this includes pneumonia, CHF exacerbation, PE, and metastatic disease. PE less likely as patient has had symptoms of hypoxia that are positional, with no cough, chest pain, or leg swelling. Pneumonia most likely given CT chest findings and respiratory status. Patient could also be suffering for CHF exacerbation given BNP and lung exam. Patient hypoxia likely multifactorial, with some component of pneumonia, and CHF exacerbation.   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 Patient follows with doctor Chesley Mires, [place]. Patient had recent hospitalization 6/1-6/4 for community-acquired pneumonia.  Patient reports feeling improved after hospitalization but notes feeling short of breath this morning.  Denies fever, cough, sputum production, but does appreciate increasing shortness of breath with activity level.  On admission patient satting greater than 92% on 3 L O2.  Chest x-ray concerning for pneumonia, CT chest demonstrating consolidation in the medial right middle lobe and inferior right upper lobe with partially loculated right-sided pleural effusion.  Patient was started on  vancomycin and cefepime.  Given patient respiratory findings and lack of sputum production, or sputum purulence, less concern for COPD exacerbation. Patient with notable parapneumonic effusion. This will likely need to be tapped, will consult Pulmonology.  - Vancomycin per pharmacy - Cefepime per pharmacy - Vitals per unit - Consult Pulmonology - Consider thoracentesis for parapneumonic effusion  Acute exacerbation of CHF (congestive heart failure) ([place]) Patient with history of HFrEF (EF 30 to 35% in 03/2022), following with Dr. Virgina Jock at [facility]. Patient reporting increased dyspnea and paroxysmal nocturnal dyspnea symptoms for last few weeks, post last hospitalization. On admission Patient satting 95% on 3 L O2, with BNP 996.4.  Patient had crackles bilaterally on exam diffusely, but appears euvolemic on exam with no signs of edema in extremities.  Patient appears to be in HF exacerbation, and is status post 1 dose of IV Lasix 40 mg. -Cardiology consulted, appreciate recs - Strict I's and O's - Daily weights - Continuous cardiac monitoring - Consider redose IV lasix 40 mg  Rash of foot Patient appreciates rash on dorsum of right foot, present since this morning. Non pruritic, blanching. Unsure at this time etiology of rash, but will continue to monitor.  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 She was diagnosed 2021. She is s/p 4 cycles of chemotherapy, with concurrent whole brain irradiation completed 09/15/20. Recent CT chest/abd/pelv showed no concern for disease progression. Patient states she's been in remission since June 2022.  Cirrhosis of liver ([place]) Cirrhosis noted on imaging, and commented on by GI doc 01/09/22. Per GI, patient noted to have evidence of Cirrhosis, but intact liver function.  IDA (iron deficiency anemia)  Patient with history of IDA,  taking ferrous sulfate 325 Patient hgb 10.3 on admission, w/ MCV 96.7. Patient seen by GI 01/09/22 and is not good candidate for endoscopy or colonoscopy. GI thought anemia 2/2 AVMs. Patient labs on admission suggesting normocytic anemia, likely anemia of chronic disease. -continue home med -transfusion threshold 8  CKD (chronic kidney disease), stage IIIa Patient with hx of CKD III per chart review. Baseline Cr 0.8-0.9, Cr today 1.16. Patient nearly has AKI, will continue to monitor. -AM BMP  Benign hypertension with CKD (chronic kidney disease) stage III ([place]) Patient with hx of htn on chart review. Patient taking losartan 25 mg daily, will hold in setting of near AKI with soft BP 98/48. -Hold home meds, consider restarting when appropriate  GERD Patient taking Protonix 40 mg daily -continue home meds  COPD with acute exacerbation ([place]) Patient with history of COPD on 3 L of O2 at baseline, interstitial lung disease, and asthma.  Home meds include Trelegy Ellipta, albuterol  -Continue Trellergy -O2 sats 88-92%  Coronary artery disease/Prior Stents 1997 and 2000/RCA occlusion 12/2019 Patient s/p MI in 96 with stent placement. RCA Chronic Total Occlusion (Cath 2021), that was medically treated given her comorbidities as well h/o GI bleed then. Home meds include ASA 81, and crestor 20 mg -Continue home meds  TOBACCO ABUSE Patient has been smoking since she was 16, with history of smoking a carton a month. Patient now smokes infrequently, 1 cigarette every 2-3 days.   Mixed hyperlipidemia Patient home meds include Crestor 20 mg daily. Last lipid panel at goal on 05/2020 w/ T chol 120, HDL 41, LDL 62. -Continue home meds  Insulin dependent type 2 diabetes mellitus ([place]) Patient with history of DM2, on metformin 500 BID and Jardiance 25 mg daily. Last HgbA1c 03/15/22 at last admission, 5.8.  -Hold home meds -sSSI -CBG checks qAC and qHS  Acquired hypothyroidism Patient taking  synthroid 112 mcg daily -continue home med    FEN/GI: Heart Healthy/Carb modified VTE Prophylaxis: Lovenox  Disposition: Admit to Med Tele  History of Present Illness:  [name] is a 71 y.o. female presenting with hypoxia   D/c from hospital 03/18/22 for PNA, and felt like she was doing better. Coughing cleared and sputum color/production went away. Woke up today feeling SOB with movment, no chest pain, no cough or phlegm, no increase in the amount of O2. Has also been feeling dizzy when SOB, but had no fall or LOC. Patient went to cards appointment and saw MD who recommeded she come in for evaluation. Notes extreme SOB with walking to the bathroom and back, with O2 on. Also notes she has been feeling SOB when laying/ flat, that has been off and on for a month or so.     In the ED, patient was sating 98% on 3 L. Patient was diuresed with 40 mg IV lasix, and started on Vancomycin and Cefipime.   Review Of Systems: Per HPI  Pertinent Past Medical History: Small cell lung cancer, in remission since June 2022. Received chemo and radiation.  S/p traumatic amputation of right hand  Remainder reviewed in history tab.   Pertinent Past Surgical History: Amputation of right hand in 1984    Remainder reviewed in history tab.  Pertinent Social History: Tobacco use: Yes/No/Former smokes 1 cig every 2-3 days. Smoking since 16, had been up to 3 cartons in a month. 10 packs in a carton Alcohol use: Over a year since last drink Other Substance  use: None Lives with partner, [name]  Pertinent Family History:  Remainder reviewed in history tab.   Important Outpatient Medications:  Remainder reviewed in medication history.   Objective: BP (!) 106/59 (BP Location: Right Arm)   Pulse 71   Temp 98.3 F (36.8 C)   Resp 20   SpO2 97%  Exam: General: Well appearing, polite, speaking in full sentences, NAD, white female Eyes: EMOI ENTM: MMM Neck: Soft, no  lympadenopathy Cardiovascular: Distant heart sounds Respiratory: Diffuse crackles bilaterally, comfortable work of breathing Gastrointestinal: Soft, NTTP, non-distended MSK: Pulses intact in all extremities, cap refill < 2 sec Derm: Erythematous rash on dorsal surface of right foot, non-puritic Psych: Good mood/ affect  Labs:  CBC BMET  Recent Labs  Lab 03/27/22 0135  WBC 4.4  HGB 9.4*  HCT 31.6*  PLT 162   Recent Labs  Lab 03/27/22 0135  NA 138  K 3.9  CL 96*  CO2 33*  BUN 14  CREATININE 1.21*  GLUCOSE 102*  CALCIUM 8.9     EKG: My own interpretation (not copied from electronic read)  Sinus rhythm with prolonged QT interval 138 with Qtc 476    Imaging Studies Performed:  Imaging Study (ie. Chest x-ray) Impression from Radiologist:    CXR: Right lower lobe and right middle lobe pneumonia superimposed upon chronic interstitial lung disease. Overall, no significant interval change.  CT chest: 1. New dense airspace consolidation in the medial right middle lobe and inferior right upper lobe abutting the mediastinum. There is some new calcifications in this region. Findings may related to pneumonia, but underlying malignancy/recurrence not excluded. 2. New moderate-sized right pleural effusion, partially loculated. 3. New small layering left pleural effusion. 4. Stable cardiomegaly. 5. Stable chronic interstitial changes.  My Interpretation:  CXR: Right lower and middle lobe pneumonia   Holley Bouche, MD 03/27/2022, 5:57 AM PGY-1, [place] Intern pager: [phone], text pages welcome Secure chat group [place]

## 2022-03-26 NOTE — Assessment & Plan Note (Signed)
Patient home meds include Crestor 20 mg daily. Last lipid panel at goal on 05/2020 w/ T chol 120, HDL 41, LDL 62. -Continue home meds

## 2022-03-26 NOTE — Assessment & Plan Note (Signed)
Cirrhosis noted on imaging, and commented on by GI doc 01/09/22. Per GI, patient noted to have evidence of Cirrhosis, but intact liver function.

## 2022-03-26 NOTE — ED Provider Triage Note (Signed)
Emergency Medicine Provider Triage Evaluation Note  [name] , a 71 y.o. female  was evaluated in triage.  Pt complains of poor oxygen saturation in the 50s when she moves/stands/sits.  Recommended by cardiology to come back to the ED.  Was recently admitted at [facility], ED for pneumonia work-up.  Active Hx of lung cancer, CHF, DMT2.  No active complaints at this time.  Denies fevers or chest pain.  Review of Systems  Positive:  Negative: As above  Physical Exam  BP (!) 108/57   Pulse 84   Temp (!) 97.3 F (36.3 C) (Oral)   Resp 20   SpO2 98%  Gen:   Awake, no distress   Resp:  Mildly increased effort, tachypneic, on 3L O2 MSK:   Moves extremities without difficulty  Other:  Abdomen soft, nontender  Medical Decision Making  Medically screening exam initiated at 1:09 PM.  Appropriate orders placed.  [name] was informed that the remainder of the evaluation will be completed by another provider, this initial triage assessment does not replace that evaluation, and the importance of remaining in the ED until their evaluation is complete.     Prince Rome, PA-C 94/49/67 [id number]

## 2022-03-27 ENCOUNTER — Inpatient Hospital Stay (HOSPITAL_COMMUNITY): Payer: Medicare HMO

## 2022-03-27 ENCOUNTER — Inpatient Hospital Stay: Payer: Medicare HMO | Admitting: Pulmonary Disease

## 2022-03-27 DIAGNOSIS — Y95 Nosocomial condition: Secondary | ICD-10-CM

## 2022-03-27 DIAGNOSIS — C342 Malignant neoplasm of middle lobe, bronchus or lung: Secondary | ICD-10-CM

## 2022-03-27 DIAGNOSIS — J90 Pleural effusion, not elsewhere classified: Secondary | ICD-10-CM

## 2022-03-27 DIAGNOSIS — R0902 Hypoxemia: Secondary | ICD-10-CM

## 2022-03-27 DIAGNOSIS — J189 Pneumonia, unspecified organism: Principal | ICD-10-CM

## 2022-03-27 DIAGNOSIS — R21 Rash and other nonspecific skin eruption: Secondary | ICD-10-CM | POA: Insufficient documentation

## 2022-03-27 DIAGNOSIS — J939 Pneumothorax, unspecified: Secondary | ICD-10-CM | POA: Diagnosis not present

## 2022-03-27 DIAGNOSIS — I509 Heart failure, unspecified: Secondary | ICD-10-CM

## 2022-03-27 LAB — BASIC METABOLIC PANEL
Anion gap: 9 (ref 5–15)
BUN: 14 mg/dL (ref 8–23)
CO2: 33 mmol/L — ABNORMAL HIGH (ref 22–32)
Calcium: 8.9 mg/dL (ref 8.9–10.3)
Chloride: 96 mmol/L — ABNORMAL LOW (ref 98–111)
Creatinine, Ser: 1.21 mg/dL — ABNORMAL HIGH (ref 0.44–1.00)
GFR, Estimated: 48 mL/min — ABNORMAL LOW (ref >60)
Glucose, Bld: 102 mg/dL — ABNORMAL HIGH (ref 70–99)
Potassium: 3.9 mmol/L (ref 3.5–5.1)
Sodium: 138 mmol/L (ref 135–145)

## 2022-03-27 LAB — ALBUMIN, PLEURAL OR PERITONEAL FLUID: Albumin, Fluid: <1.5 g/dL

## 2022-03-27 LAB — PROTEIN, PLEURAL OR PERITONEAL FLUID: Total protein, fluid: <3 g/dL

## 2022-03-27 LAB — BODY FLUID CELL COUNT WITH DIFFERENTIAL
Eos, Fluid: 0 %
Lymphs, Fluid: 79 %
Monocyte-Macrophage-Serous Fluid: 10 % — ABNORMAL LOW (ref 50–90)
Neutrophil Count, Fluid: 11 % (ref 0–25)
Total Nucleated Cell Count, Fluid: 132 cu mm (ref 0–1000)

## 2022-03-27 LAB — LACTATE DEHYDROGENASE, PLEURAL OR PERITONEAL FLUID: LD, Fluid: 46 U/L — ABNORMAL HIGH (ref 3–23)

## 2022-03-27 LAB — GLUCOSE, PLEURAL OR PERITONEAL FLUID: Glucose, Fluid: 110 mg/dL

## 2022-03-27 LAB — CBC
HCT: 31.6 % — ABNORMAL LOW (ref 36.0–46.0)
Hemoglobin: 9.4 g/dL — ABNORMAL LOW (ref 12.0–15.0)
MCH: 28.4 pg (ref 26.0–34.0)
MCHC: 29.7 g/dL — ABNORMAL LOW (ref 30.0–36.0)
MCV: 95.5 fL (ref 80.0–100.0)
Platelets: 162 10*3/uL (ref 150–400)
RBC: 3.31 MIL/uL — ABNORMAL LOW (ref 3.87–5.11)
RDW: 16.2 % — ABNORMAL HIGH (ref 11.5–15.5)
WBC: 4.4 10*3/uL (ref 4.0–10.5)
nRBC: 0 % (ref 0.0–0.2)

## 2022-03-27 LAB — LACTIC ACID, PLASMA: Lactic Acid, Venous: 0.6 mmol/L (ref 0.5–1.9)

## 2022-03-27 LAB — ALBUMIN: Albumin: 2.9 g/dL — ABNORMAL LOW (ref 3.5–5.0)

## 2022-03-27 LAB — AMYLASE, PLEURAL OR PERITONEAL FLUID: Amylase, Fluid: 19 U/L

## 2022-03-27 LAB — MRSA NEXT GEN BY PCR, NASAL: MRSA by PCR Next Gen: NOT DETECTED

## 2022-03-27 LAB — LACTATE DEHYDROGENASE: LDH: 216 U/L — ABNORMAL HIGH (ref 98–192)

## 2022-03-27 MED ORDER — FUROSEMIDE 10 MG/ML IJ SOLN
40.0000 mg | Freq: Once | INTRAMUSCULAR | Status: AC
Start: 2022-03-27 — End: 2022-03-27
  Administered 2022-03-27: 40 mg via INTRAVENOUS
  Filled 2022-03-27: qty 4

## 2022-03-27 MED ORDER — LOSARTAN POTASSIUM 25 MG PO TABS
12.5000 mg | ORAL_TABLET | Freq: Every day | ORAL | Status: DC
  Administered 2022-03-27 – 2022-03-28 (×2): 12.5 mg via ORAL
  Filled 2022-03-27 (×2): qty 1

## 2022-03-27 MED ORDER — EMPAGLIFLOZIN 25 MG PO TABS
25.0000 mg | ORAL_TABLET | Freq: Every day | ORAL | Status: DC
  Administered 2022-03-28 – 2022-03-31 (×4): 25 mg via ORAL
  Filled 2022-03-27 (×5): qty 1

## 2022-03-27 MED ORDER — OXYCODONE HCL 5 MG PO TABS
5.0000 mg | ORAL_TABLET | Freq: Two times a day (BID) | ORAL | Status: DC | PRN
  Administered 2022-03-27 – 2022-04-01 (×7): 5 mg via ORAL
  Filled 2022-03-27 (×7): qty 1

## 2022-03-27 NOTE — Procedures (Signed)
Thoracentesis  Procedure Note  [name]  [id number]  28-Jul-1951  Date:03/27/22  Time:9:50 AM   Provider Performing:Naaman Curro V. Ahmani Prehn   Procedure: Thoracentesis with imaging guidance (87867)  Indication(s) Pleural Effusion  Consent Risks of the procedure as well as the alternatives and risks of each were explained to the patient and/or caregiver.  Consent for the procedure was obtained and is signed in the bedside chart  Anesthesia Topical only with 1% lidocaine    Time Out Verified patient identification, verified procedure, site/side was marked, verified correct patient position, special equipment/implants available, medications/allergies/relevant history reviewed, required imaging and test results available.   Sterile Technique Maximal sterile technique including full sterile barrier drape, hand hygiene, sterile gown, sterile gloves, mask, hair covering, sterile ultrasound probe cover (if used).  Procedure Description Ultrasound was used to identify appropriate pleural anatomy for placement and overlying skin marked.  Area of drainage cleaned and draped in sterile fashion. Lidocaine was used to anesthetize the skin and subcutaneous tissue.  1400 cc's of amber -brownish yellow appearing, free flowing fluid was drained from the right pleural space. Catheter then removed and bandaid applied to site.   Complications/Tolerance None; patient tolerated the procedure well. Chest X-ray is ordered to confirm no post-procedural complication.   [place] pic in media tab  EBL Minimal   Specimen(s) Pleural fluid  [name] Lascola V. Elsworth Soho MD

## 2022-03-27 NOTE — Progress Notes (Addendum)
PT Cancellation Note  Patient Details Name: [name] MRN: [id number] DOB: 1951-03-31   Cancelled Treatment:    Reason Eval/Treat Not Completed: Patient at procedure or test/unavailable (thoracentesis)  Wyona Almas, PT, DPT Acute Rehabilitation Services Office [place] 03/27/2022, 9:51 AM

## 2022-03-27 NOTE — Progress Notes (Signed)
Heart Failure Stewardship Pharmacist Progress Note   PCP: [name], MD PCP-Cardiologist: None    HPI:  [name] is a 71 y.o. 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   Last echo showed EF 30-35%, normal RV, abnormal septal motion consistent with LBBB, and trivial MR (03/2022). She is followed by Dr. Virgina Jock at [facility].   She presented with hypoxia. She has symptoms of hypoxia that are positional, with no cough, chest pain, or leg swelling. Pneumonia most likely given CT chest findings and respiratory status. Patient could also be suffering for CHF exacerbation given BNP and lung exam. Patient hypoxia likely multifactorial, with some component of pneumonia, and CHF exacerbation. She was discharged from the hospital on 03/18/2022 for PNA. She is currently on 3L [place].   03/26/2022 chest X-ray with right lower lobe and right middle lobe pneumonia superimposed upon chronic interstitial lung disease. CT chest on admission with new dense airspace consolidation in the medial right middle lobe and inferior right upper lobe. She underweight thoracentesis on 03/27/2022 with 1.4L of fluid removed, now with right pneumothorax.  Current HF Medications: Diuretic: Furosemide 40 mg IV x1 Beta Blocker: none ACE/ARB/ARNI: Losartan 12.5 mg daily Aldosterone Antagonist: none SGLT2i: Jardiance 25 mg daily  Prior to admission HF Medications: Diuretic: furosemide 20 mg PRN Beta blocker: none ACE/ARB/ARNI: losartan 25 mg daily Aldosterone Antagonist: none SGLT2i: Jardiance 25 mg daily  Pertinent Lab Values: Labs 03/27/2022: Serum creatinine 1.21, BUN 14, Potassium 3.9, Sodium 138, BNP 996.4, A1c 5.8% 03/27/2022: lung  cytology and cultures pending  Vital Signs: Weight: not yet documented (admission weight 124 lbs) Blood pressure: 108/58 mmHg  Heart rate: 75-80 bpm  I/O: not documented  Medication Assistance / Insurance Benefits Check: Does the patient have prescription insurance?  Yes Type of insurance plan: [employer] Medicare  Does the patient qualify for medication assistance through manufacturers or grants?   Yes Eligible grants and/or patient assistance programs: Jardiance and [employer] Medication assistance applications in progress: Jardiance and [employer] Medication assistance applications approved: none Approved medication assistance renewals will be completed by: Dr. Bonney Roussel office  Outpatient Pharmacy:  Prior to admission outpatient pharmacy: [facility] Is the patient willing to use [facility] at discharge? Yes Is the patient willing to transition their outpatient pharmacy to utilize a [facility] outpatient pharmacy?   Pending   Assessment: 1. 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 NYHA class IV symptoms. -Patient on carboplatin regimen for SCLC, which is cardiotoxic and may be contributing to her cardiomyopathy. She has also underwent chest radiation for her SCLC which is known to be cardiotoxic. EF in 12/2019 was 55-60% when she underwent [facility] which also showed CTO of RCA and it was recommended she underwent intervention, however it was medically managed. No repeat echo until 03/2022 which showed new drop to 30-35%.   -Patient is euvolemic on exam. With increasing Scr of 1.21 (BL Scr 0.8-0.9) and BNP elevated, but similar to recent discharge at 996 (1600 on admission on 03/15/2022). This appears to be more of a recurrence of PNA vs. HF exacerbation would recommend holding further furosemide.  -Patient would benefit from addition of spironolactone, however defer until AKI resolved.  -SBP soft  and  with AKI, would recommend keeping SBP >110 to support renal perfusion. Hold losartan 12.5 mg daily.  -Patient on Jardiance 25 mg daily. Ok to continue.  -Per notes patient on beta-blocker and held by cardiologist with possible HF decompensation, however beta blocker not listed on PTA medication list. Patient may benefit from bisoprolol given most B1 selective with known COPD on 2L [place] at [facility]. Will continue to hold for now and initiate closer to discharge.   Plan: 1) Medication changes recommended at this time: -Hold further IV furosemide -Hold losartan until Scr improves  2) Patient assistance: -pending  3)  Education  - To be completed prior to discharge  Cathrine Muster, PharmD, [facility] PGY2 Cardiology Pharmacy Resident

## 2022-03-27 NOTE — Assessment & Plan Note (Addendum)
Stable and afebrile. Will continue her abx. -Pulmonology following, appreciate recs -Continue IV cefepime. (day 4/5)

## 2022-03-27 NOTE — Progress Notes (Signed)
Patient resting peacefully this morning. Workup thus far reviewed. Consolidation likely cause of her symptoms, unlikely to be related to heart failure decompensation. BP soft. Continue low dose losartan. Hold BB still.   Nigel Mormon, MD Pager: [phone] Office: [phone]

## 2022-03-27 NOTE — Progress Notes (Signed)
Patient refusing blood sugar checks and insulin

## 2022-03-27 NOTE — Progress Notes (Addendum)
Daily Progress Note Intern Pager: [phone]  Patient name: [name] Medical record number: [id number] Date of birth: [birth year] Age: 71 y.o. Gender: female  Primary Care Provider: [name], MD Consultants: Pulmonology Code Status: Full  Pt Overview and Major Events to Date:  6/12-admitted  Assessment and Plan: [name] is a 71 y.o. female presenting with hypoxia in setting of pneumonia and pleural effusion.  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   Acute respiratory failure with hypoxia (HCC) Patient this morning endorses improved symptoms.  Currently on baseline oxygen of 3L [place] reassuring O2 status and on exam have right basilar crackles.  Her AHRF likely multifactorial given history of COPD, HF exacerbation, imaging finding of possible pneumonia and moderate loculated pleural effusion secondary to her small cell lung cancer.  Main culprit for her worsening dyspnea is likely her PNA and pleural effusion, pulmonology has been consulted  Who performed thoracentesis yielding 1.4L with fluid sent for chemistry and cytology -Pulmonology following, appreciate recs -Follow up pleura fluid studies  -O2 sats 88-92% - PT/OT eval and treat -continue O2 supplementation  Pneumonia Hard to tell if patient's dyspnea is due to pneumonia given that she was recently discharged from hospital for pneumonia.  On admission have normal white blood counts and has been afebrile. Her CXR shows right middle lobe and lower lobe infiltrate suspicious for pneumonia.Patient likely failed PNA treatment from prior hospitalization.  Pulmonology following who recommend continuing cefepime treatment. -Pulmonology following, appreciate recs - Cefepime per pharmacy - continue routine vitals  Acute exacerbation of CHF (congestive heart failure) ([place]) Patient's most recent echo shows EF 30-35% this month.  BNP on admission  was 996.4.  Been having worsening dyspnea likely to be read by CHF exacerbation.  She is s/p 1 dose Lasix 40 mg.  Cardiology following and does not think patient's hypoxia is of cardiac etiology. 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 She is s/p 4 cycles of chemotherapy, with concurrent whole brain radiation treatment completed 09/15/20. In remission since June 2022.  Cirrhosis of liver ([place]) Cirrhosis noted on imaging, and commented on by GI doc 01/09/22. Per GI, patient noted to have evidence of Cirrhosis, but intact liver function.  IDA (iron deficiency anemia) Hemoglobin is 9.6 this morning and asymptomatic.  Home medication includes ferrous sulfate 325 daily. -continue home med -transfusion threshold 8  COPD with acute exacerbation ([place]) Patient with history of COPD on 3 L of O2 at baseline, interstitial lung disease, and asthma.  Home meds include Trelegy Ellipta, albuterol  -Continue Trellergy -O2 sats 88-92%   Insulin dependent type 2 diabetes mellitus ([place]) Blood glucose today was 102. Home med include metformin 500 BID and Jardiance 25 mg daily -Hold home meds -sSSI -Monitor blood glucose with BMP    Chronic and stable conditions Hypothyroidism-Synthroid 112 mcg daily  Hyperlipidemia-continue Crestor 20 mg daily CAD s/p stent-continue aspirin, and Crestor 20 mg daily GERD-continue 40 mg Protonix daily. Liver cirrhosis-follow outpatient GI Hypertension-holding home medication of losartan 25 mg daily  FEN/GI: Carb modified diet PPx: Lovenox Dispo:Home pending clinical improvement . Barriers include clinical status.   Subjective:  [name] says she is doing well and feeling much better today.  No complaint at this time  Objective: Temp:  [  97.6 F (36.4 C)-98.3 F (36.8 C)] 97.8 F  (36.6 C) (06/13 1133) Pulse Rate:  [63-83] 75 (06/13 1133) Resp:  [15-27] 17 (06/13 1133) BP: (87-124)/(47-77) 92/56 (06/13 1133) SpO2:  [95 %-100 %] 100 % (06/13 1133) Physical Exam: General: Alert, well appearing, NAD CV: RRR, no murmurs, normal S1/S2 Pulm: CTAB, good WOB on 3L [place], right basilar crackles Abd: Soft, no distension, no tenderness Ext: No BLE edema   Laboratory: Most recent CBC Lab Results  Component Value Date   WBC 4.4 03/27/2022   HGB 9.4 (L) 03/27/2022   HCT 31.6 (L) 03/27/2022   MCV 95.5 03/27/2022   PLT 162 03/27/2022   Most recent BMP    Latest Ref Rng & Units 03/27/2022    1:35 AM  BMP  Glucose 70 - 99 mg/dL 102   BUN 8 - 23 mg/dL 14   Creatinine 0.44 - 1.00 mg/dL 1.21   Sodium 135 - 145 mmol/L 138   Potassium 3.5 - 5.1 mmol/L 3.9   Chloride 98 - 111 mmol/L 96   CO2 22 - 32 mmol/L 33   Calcium 8.9 - 10.3 mg/dL 8.9      Imaging/Diagnostic Tests: DG CHEST PORT 1 VIEW  Addendum Date: 03/27/2022   ADDENDUM REPORT: 03/27/2022 10:12 ADDENDUM: These results were called by telephone at the time of interpretation on 03/27/2022 at 10:12 am to provider [facility] , who verbally acknowledged these results. Electronically Signed   By: Zetta Bills M.D.   On: 03/27/2022 10:12   Result Date: 03/27/2022 CLINICAL DATA:  Post thoracentesis on the RIGHT with shortness of breath. EXAM: PORTABLE CHEST 1 VIEW COMPARISON:  CT of October 20, 2021 in March 26, 2022. FINDINGS: Lucency along the RIGHT mediastinal border. Lucency at the RIGHT lung apex and along the RIGHT lateral chest implies moderate pneumothorax following evacuation of partially loculated pleural fluid in the RIGHT chest. There is perhaps trace pleural fluid in the RIGHT lung base. There is no shift of mediastinal structures accounting for slight rotation to the RIGHT. The heart size is stable. Added density over the RIGHT heart border compatible with treated lung cancer similar to previous imaging. Subtle  graded opacity at the inferior aspect of the LEFT chest. No LEFT-sided pneumothorax. On limited assessment there is no acute skeletal process. IMPRESSION: 1. Moderate RIGHT pneumothorax following thoracentesis with likely small amount of pleural fluid in the RIGHT lung base. Appearance of pneumothorax implies areas of scarring and "trapped lung" following treatment of lung cancer. 2. Subtle graded opacity in the inferior LEFT chest compatible with small LEFT-sided effusion. 3. Background interstitial markings may reflect a combination of edema and chronic changes. 4. A call is out to the referring provider to further discuss findings in the above case. Electronically Signed: By: Zetta Bills M.D. On: 03/27/2022 10:05   CT Chest Wo Contrast  Result Date: 03/26/2022 CLINICAL DATA:  Pleural effusion, malignancy suspected. History of small cell lung cancer. EXAM: CT CHEST WITHOUT CONTRAST TECHNIQUE: Multidetector CT imaging of the chest was performed following the standard protocol without IV contrast. RADIATION DOSE REDUCTION: This exam was performed according to the departmental dose-optimization program which includes automated exposure control, adjustment of the mA and/or kV according to patient size and/or use of iterative reconstruction technique. COMPARISON:  Chest x-ray same day. CT chest abdomen and pelvis 10/20/2021. FINDINGS: Cardiovascular: Heart is mildly enlarged, unchanged. Aorta is normal in size. There are atherosclerotic calcifications of the aorta and coronary arteries. There is no pericardial  effusion. Mediastinum/Nodes: No discrete enlarged lymph nodes are identified. Evaluation for hilar lymphadenopathy is limited secondary to lack of intravenous contrast. The esophagus and visualized thyroid gland are within normal limits. Lungs/Pleura: Chronic peripheral interstitial changes are similar to the prior study. There is a moderate-sized partially loculated right pleural effusion which is new  from prior. There is also a new small layering left pleural effusion. There is new airspace consolidation within the medial right middle lobe and adjacent inferior right upper lobe extending to the hilum. This abuts the mediastinum. There is some central amorphous calcifications in this region image 6/81. This area encompasses the previously identified nodular density in the right middle lobe. Air bronchograms are present. Trachea and central airways are patent. Upper Abdomen: Cholecystectomy clips are present. No acute abnormality. Musculoskeletal: No chest wall mass or suspicious bone lesions identified. IMPRESSION: 1. New dense airspace consolidation in the medial right middle lobe and inferior right upper lobe abutting the mediastinum. There is some new calcifications in this region. Findings may related to pneumonia, but underlying malignancy/recurrence not excluded. 2. New moderate-sized right pleural effusion, partially loculated. 3. New small layering left pleural effusion. 4. Stable cardiomegaly. 5. Stable chronic interstitial changes. Aortic Atherosclerosis (ICD10-I70.0). Electronically Signed   By: Ronney Asters M.D.   On: 03/26/2022 18:08   DG Chest 2 View  Result Date: 03/26/2022 CLINICAL DATA:  Shortness of breath EXAM: CHEST - 2 VIEW COMPARISON:  03/15/2022 FINDINGS: Bilateral diffuse interstitial thickening consistent with underlying chronic interstitial lung disease. More focal right lower lobe and right middle lobe airspace disease concerning for pneumonia. Trace right pleural effusion. No left pleural effusion. No pneumothorax. Stable cardiomediastinal silhouette. No aggressive osseous lesion. IMPRESSION: 1. Right lower lobe and right middle lobe pneumonia superimposed upon chronic interstitial lung disease. Overall, no significant interval change. Electronically Signed   By: Kathreen Devoid M.D.   On: 03/26/2022 14:50    Alen Bleacher, MD 03/27/2022, 12:51 PM  PGY-1, [place] Intern pager: [phone], text pages welcome Secure chat group [place]

## 2022-03-27 NOTE — Assessment & Plan Note (Addendum)
Secondary to right-sided pleural effusion. Significantly improved s/p thoracentesis with 1400 cc pleural fluid removed. Stable on her home 3L -Pulmonology following, appreciate recommendations -Continue daily CXR for monitoring of pneumothorax (see below) -Holding IV Lasix for now (see HFrEF below) -O2 sat goals 88-92%

## 2022-03-27 NOTE — Consult Note (Signed)
NAME:  [name], MRN:  [id number], DOB:  [birth year], LOS: 1 ADMISSION DATE:  03/26/2022, CONSULTATION DATE:  03/27/2022  REFERRING MD:  Nori Riis, MD, CHIEF COMPLAINT: Shortness of breath  History of Present Illness:  71 year old with COPD on home oxygen 3 L and history of small cell lung cancer admitted with shortness of breath for a few days and right-sided pleuritic chest pain around the right breast for 2 days. She was seen 5/26 and 6/1 in the office and treated with prednisone taper and antibiotics She was hospitalized 6/1 to 6/4 for community-acquired pneumonia  Admission chest x-ray showed right middle lower lobe consolidation, CT chest showed consolidation in the right middle lobe and inferior right upper lobe with partially loculated right-sided pleural effusion. Hence PCCM consulted.  Oxygen saturation on admission was 92% on 3 L.  She was started on vancomycin and cefepime BNP was 996 Rash was noted on the dorsum of her right foot  Pertinent  Medical History  HFrEF COPD on home oxygen, 3 L, follows with Dr. 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  Unable to do lung volume testing  CT chest with contrast 10/2021 -decrease in size of right middle lobe lesion from 2.7 x 2.0 cm to 1.8 x 1.5 cm  Significant Hospital Events: Including procedures, antibiotic start and stop dates in addition to other pertinent events   Right thoracentesis -1400 cc of fluid removed  Interim History / Subjective:  Oxygen saturation 95% on 3 L. Complains of right-sided chest pain  Objective   Blood pressure 103/68, pulse 80, temperature 98.1 F (36.7 C), temperature source Oral, resp. rate (!) 24, SpO2 100 %.         Intake/Output Summary (Last 24 hours) at 03/27/2022 0953 Last data filed at 03/26/2022 2151 Gross per 24 hour  Intake 295.75 ml  Output --  Net 295.75 ml   There were no vitals filed for this visit.  Examination: General: Elderly woman, able to/supine no distress HENT: Mild pallor, no icterus, no JVD Lungs: Decreased breath sounds right base, crackles left base, no rhonchi Cardiovascular: S1-S2 regular, no murmur Abdomen: Soft, nontender, no hepatosplenomegaly Extremities: No edema, no rash Neuro: Alert, oriented, nonfocal   Labs did not show any leukocytosis, stable anemia, slight increase in creatinine from 1.0-1.2, normal electrolytes  Resolved Hospital Problem list     Assessment & Plan:  Right middle and inferior right upper lobe consolidation -Failed treatment for CAP -This is her second admission for the same, now being treated for with HAP coverage-cefepime and vancomycin. Vancomycin can be discontinued since MRSA PCR is negative   Right pleural effusion -differential includes transudative due to heart failure, although BNP is high she does not appear to be in overt CHF Exudative -parapneumonic versus recurrent malignancy  -Thoracentesis was performed with removal of 1.4 L of fluid. -Fluid sent for chemistry, cell count and cytology, await results  Of note, she does not seem to have significant airway obstruction on PFTs and hypoxia is more likely on the basis of pulmonary fibrosis.  Not considered to be a candidate for antifibrotic's due to liver disease  Best Practice (right click and "Reselect all SmartList Selections" daily)   Per primary service Code  Status:  full code   Labs   CBC: Recent Labs  Lab 03/26/22 1333 03/27/22 0135  WBC 6.4 4.4  NEUTROABS 5.1  --   HGB 10.3* 9.4*  HCT 35.0* 31.6*  MCV 96.7 95.5  PLT 189 993    Basic Metabolic Panel: Recent Labs  Lab 03/26/22 1333 03/27/22 0135  NA 139 138  K 4.8 3.9  CL 98 96*  CO2 34* 33*   GLUCOSE 165* 102*  BUN 12 14  CREATININE 1.16* 1.21*  CALCIUM 9.6 8.9   GFR: Estimated Creatinine Clearance: 38.7 mL/min (A) (by C-G formula based on SCr of 1.21 mg/dL (H)). Recent Labs  Lab 03/26/22 1333 03/27/22 0135  WBC 6.4 4.4  LATICACIDVEN  --  0.6    Liver Function Tests: Recent Labs  Lab 03/26/22 1333  AST 14*  ALT 13  ALKPHOS 53  BILITOT 0.9  PROT 6.4*  ALBUMIN 3.1*   No results for input(s): "LIPASE", "AMYLASE" in the last 168 hours. No results for input(s): "AMMONIA" in the last 168 hours.  ABG    Component Value Date/Time   PHART 7.413 09/04/2014 2018   PCO2ART 41.8 09/04/2014 2018   PO2ART 54.3 (L) 09/04/2014 2018   HCO3 26.1 (H) 09/04/2014 2018   TCO2 23.7 09/04/2014 2018   ACIDBASEDEF 4.4 (H) 01/01/2013 0450   O2SAT 89.8 09/04/2014 2018     Coagulation Profile: No results for input(s): "INR", "PROTIME" in the last 168 hours.  Cardiac Enzymes: No results for input(s): "CKTOTAL", "CKMB", "CKMBINDEX", "TROPONINI" in the last 168 hours.  HbA1C: Hgb A1c MFr Bld  Date/Time Value Ref Range Status  03/15/2022 09:36 PM 5.8 (H) 4.8 - 5.6 % Final    Comment:    (NOTE) Pre diabetes:          5.7%-6.4%  Diabetes:              >6.4%  Glycemic control for   <7.0% adults with diabetes   03/01/2021 05:06 AM 6.5 (H) 4.8 - 5.6 % Final    Comment:    (NOTE) Pre diabetes:          5.7%-6.4%  Diabetes:              >6.4%  Glycemic control for   <7.0% adults with diabetes     CBG: No results for input(s): "GLUCAP" in the last 168 hours.  Review of Systems:   Shortness of breath Chest pain  Constitutional: negative for anorexia, fevers and sweats  Eyes: negative for irritation, redness and visual disturbance  Ears, nose, mouth, throat, and face: negative for earaches, epistaxis, nasal congestion and sore throat   Cardiovascular: negative for lower extremity edema, orthopnea, palpitations and syncope  Gastrointestinal: negative for abdominal  pain, constipation, diarrhea, melena, nausea and vomiting  Genitourinary:negative for dysuria, frequency and hematuria  Hematologic/lymphatic: negative for bleeding, easy bruising and lymphadenopathy  Musculoskeletal:negative for arthralgias, muscle weakness and stiff joints  Neurological: negative for coordination problems, gait problems, headaches and weakness  Endocrine: negative for diabetic symptoms including polydipsia, polyuria and weight loss    Past Medical History:  She,  has a past medical history of Acute on chronic diastolic CHF (congestive heart failure) ([place]) (02/28/2021), Allergic rhinitis, Amputation of hand, right ([place]) (1984), Anemia, Anxiety, ASCVD (arteriosclerotic cardiovascular disease), Cholelithiasis, COPD (chronic obstructive pulmonary disease) ([place]), DDD (degenerative disc disease), lumbar, Depression, Diabetes mellitus, Diarrhea, DVT (deep venous thrombosis) ([place]), GERD (gastroesophageal reflux disease), Headache, History of kidney stones, History of radiation therapy (06/28/2020-08/08/2020),  History of radiation therapy (03/22/2021), Hyperlipidemia, Hypertension, Hypotension, Hypothyroidism, IBS (irritable bowel syndrome), Low back pain, Myocardial infarction [facility]) (01/1995), Nephrolithiasis (2006), Panic attacks, Peripheral neuropathy, Peripheral vascular disease ([place]), Pneumonia (12/22/2019), Sciatic pain, Small cell lung cancer ([place]), Tobacco abuse, Tremor, and Weakness of extremity.   Surgical History:   Past Surgical History:  Procedure Laterality Date   AGILE CAPSULE N/A 03/07/2020   Procedure: AGILE CAPSULE;  Surgeon: Daneil Dolin, MD;  [facility];  Service: Endoscopy;  Laterality: N/A;  7:30am   BACK SURGERY     fusion   BRONCHIAL NEEDLE ASPIRATION BIOPSY N/A 05/20/2020   Procedure: BRONCHIAL NEEDLE ASPIRATION BIOPSIES;  Surgeon: Collene Gobble, MD;  Location: [facility];  Service: Pulmonary;  Laterality: N/A;   CHOLECYSTECTOMY      COLONOSCOPY  01/2009   YNW:GNFAOZ rectum/repeat in 5 yrs   COLONOSCOPY N/A 04/29/2014   Dr.Rourk- attempted/incomplete colonoscopy. inadequate prep   COLONOSCOPY N/A 05/27/2014   HYQ:MVHQIONGE coli. Colonic polyps-removed as described above.Status post segmental biopsy. single tubular adenoma and random colon bx neg   COLONOSCOPY WITH PROPOFOL N/A 12/23/2019   Surgeon: Ronnette Juniper, MD; single polyp removed from colon (path-lipoma), single non-bleeding colonic angioectasia. Treated with argon plasma coagulation (APC). Friable (with contact bleeding) mucosa in the transverse colon. Clip (MR conditional) was placed.   DILATION AND CURETTAGE OF UTERUS  1974   ESOPHAGOGASTRODUODENOSCOPY  05/2010   Dr. Tessie Fass, erosion. 94F dilation   ESOPHAGOGASTRODUODENOSCOPY N/A 04/29/2014   Dr.Rourk- normal esophagus s/p passage of maloney dilator. small hiatal hernia- bx= chronic inflammation.   ESOPHAGOGASTRODUODENOSCOPY (EGD) WITH PROPOFOL N/A 12/23/2019   Surgeon: Ronnette Juniper, MD;  mild gastritis (no specimen collected)   GIVENS CAPSULE STUDY N/A 03/22/2020   Surgeon: Daneil Dolin, MD; few gastric erosions and small AVM. Few scattered small bowel erosions. Couple of small bowel AVMs.   HEMOSTASIS CLIP PLACEMENT  12/23/2019   Procedure: HEMOSTASIS CLIP PLACEMENT;  Surgeon: Ronnette Juniper, MD;  Location: [facility];  Service: Gastroenterology;;   HOT HEMOSTASIS N/A 12/23/2019   Procedure: HOT HEMOSTASIS (ARGON PLASMA COAGULATION/BICAP);  Surgeon: Ronnette Juniper, MD;  Location: [facility];  Service: Gastroenterology;  Laterality: N/A;   KNEE SURGERY Left    LEFT HEART CATH AND CORONARY ANGIOGRAPHY N/A 12/24/2019   Procedure: LEFT HEART CATH AND CORONARY ANGIOGRAPHY;  Surgeon: Nigel Mormon, MD;  Location: [facility];  Service: Cardiovascular;  Laterality: N/A;   MALONEY DILATION N/A 04/29/2014   Procedure: Venia Minks DILATION;  Surgeon: Daneil Dolin, MD;  [facility];  Service:  Endoscopy;  Laterality: N/A;   ORIF PATELLA Right 12/26/2021   Procedure: RIGHT OPEN REDUCTION INTERNAL (ORIF) FIXATION PATELLA;  Surgeon: Meredith Pel, MD;  Location: [facility];  Service: Orthopedics;  Laterality: Right;   PARTIAL HYSTERECTOMY  1978   POLYPECTOMY  12/23/2019   Procedure: POLYPECTOMY;  Surgeon: Ronnette Juniper, MD;  Location: [facility];  Service: Gastroenterology;;   SHOULDER SURGERY Left    Left shoulder for RTC;left arm surgery '98/left hand surgery 2001   TOTAL ABDOMINAL HYSTERECTOMY W/ BILATERAL SALPINGOOPHORECTOMY  9528   UMBILICAL HERNIA REPAIR  2008   VIDEO BRONCHOSCOPY WITH ENDOBRONCHIAL ULTRASOUND N/A 05/20/2020   Procedure: VIDEO BRONCHOSCOPY WITH ENDOBRONCHIAL ULTRASOUND;  Surgeon: Collene Gobble, MD;  Location: [facility];  Service: Pulmonary;  Laterality: N/A;     Social History:   reports that she has been smoking cigarettes. She has a 10.00 pack-year smoking history. She has never used smokeless tobacco.  She reports that she does not drink alcohol and does not use drugs.   Family History:  Her family history includes Alcohol abuse in her father; Aneurysm in her father; Bipolar disorder in her mother; Colon cancer in her paternal grandfather; Dementia in her mother; Depression in her mother; Healthy in her child; Pulmonary fibrosis in her mother.   Allergies Allergies  Allergen Reactions   Bee Venom Shortness Of Breath and Swelling   Iohexol Shortness Of Breath and Other (See Comments)    CHEST TIGHTNESS and BREATHING PROBLEMS- NEEDS PRE-MEDS 13 hours beforehand-  premeds given 06/08/2020 without incident    Tape Other (See Comments)    Tears the skin!!   Betadine [Povidone Iodine] Rash   Ciprofloxacin Rash   Latex Rash and Other (See Comments)    NO latex gloves!!    Penicillins Swelling    Did it involve swelling of the face/tongue/throat, SOB, or low BP? Yes Did it involve sudden or severe rash/hives, skin peeling, or any reaction on the  inside of your mouth or nose? No Did you need to seek medical attention at a hospital or doctor's office? Yes When did it last happen?      1996 If all above answers are "NO", may proceed with cephalosporin use.   Povidone-Iodine Rash     Home Medications  Prior to Admission medications   Medication Sig Start Date End Date Taking? Authorizing Provider  albuterol (PROVENTIL) (2.5 MG/3ML) 0.083% nebulizer solution Take 3 mLs (2.5 mg total) by nebulization every 6 (six) hours as needed for up to 30 doses for wheezing or shortness of breath. 11/14/21  Yes Chesley Mires, MD  albuterol (VENTOLIN HFA) 108 (90 Base) MCG/ACT inhaler INHALE TWO PUFFS INTO THE LUNGS EVERY FOUR HOURS AS NEEDED FOR WHEEZING OR SHORTNESS OF BREATH Patient taking differently: Inhale 2 puffs into the lungs every 4 (four) hours as needed for wheezing or shortness of breath. 01/17/22  Yes Chesley Mires, MD  aspirin 81 MG chewable tablet Chew 1 tablet (81 mg total) by mouth 2 (two) times daily. 12/28/21  Yes Magnant, Charles L, PA-C  b complex vitamins capsule Take 2 capsules by mouth daily.   Yes [provider]  Benzocaine (BOIL-EASE EX) Apply 1 application. topically daily as needed (Boil).   Yes [provider]  carbamide peroxide (DEBROX) 6.5 % OTIC solution Place 5 drops into both ears daily as needed (ears).   Yes [provider]  Cholecalciferol (VITAMIN D3) 50 MCG (2000 UT) capsule Take 6,000 Units by mouth daily.   Yes [provider]  Coenzyme Q10 (COQ-10 PO) Take 100 mg by mouth daily.   Yes [provider]  colestipol (COLESTID) 1 g tablet TAKE 2 TABLETS BY MOUTH ONCE DAILY. DO NOT TAKE WITHIN TWO HOURS OF OTHER MEDS. Patient taking differently: Take 2 g by mouth daily. Do not take within two hours of other meds. 03/05/22  Yes Erenest Rasher, PA-C  dextromethorphan-guaiFENesin (MUCINEX DM) 30-600 MG 12hr tablet Take 1 tablet by mouth 2 (two) times daily for 10 days. 03/17/22  03/27/22 Yes Shah, Pratik D, DO  diazepam (VALIUM) 5 MG tablet Take 2.5 mg by mouth 2 (two) times daily as needed for anxiety. 03/13/22  Yes [provider]  diclofenac Sodium (VOLTAREN) 1 % GEL Apply 2 g topically as needed (pain).   Yes [provider]  docusate sodium (COLACE) 100 MG capsule Take 1 capsule (100 mg total) by mouth 2 (two) times daily. 12/28/21  Yes Magnant, Charles L, PA-C  empagliflozin (JARDIANCE) 25 MG TABS tablet Take 25 mg by mouth daily.   Yes [provider]  Ferrous Sulfate (IRON) 325 (65 Fe) MG TABS Take 325 mg by mouth daily.   Yes [provider]  Fluticasone-Umeclidin-Vilant (TRELEGY ELLIPTA) 100-62.5-25 MCG/ACT AEPB Inhale 1 puff into the lungs daily. 03/09/22  Yes Cobb, Karie Schwalbe, NP  furosemide (LASIX) 20 MG tablet Take 1 tablet (20 mg total) by mouth daily as needed for fluid or edema. Take for 3 lb weight gain in 24 hours or 5 lb weight gain over 7 days. Patient taking differently: Take 20 mg by mouth daily. 03/18/22 04/17/22 Yes Shah, Pratik D, DO  hydrocortisone cream 1 % Apply 1 application topically daily as needed for itching.   Yes [provider]  levothyroxine (SYNTHROID) 112 MCG tablet Take 112 mcg by mouth daily before breakfast.   Yes [provider]  lidocaine (LIDODERM) 5 % Place 1 patch onto the skin daily. Remove & Discard patch within 12 hours or as directed by MD 03/09/22  Yes Cobb, Karie Schwalbe, NP  losartan (COZAAR) 25 MG tablet Take 0.5 tablets (12.5 mg total) by mouth daily. 03/18/22 04/17/22 Yes Shah, Pratik D, DO  metFORMIN (GLUCOPHAGE) 500 MG tablet Take 500 mg by mouth 2 (two) times daily with a meal. 09/14/21  Yes [provider]  Multiple Vitamin (MULTI VITAMIN) TABS Take 1 tablet by mouth daily.   Yes [provider]  neomycin-bacitracin-polymyxin (NEOSPORIN) OINT Apply 1 application topically daily as needed for irritation or wound care.   Yes [provider]  NON  FORMULARY Take 1 capsule by mouth daily. [place]   Yes [provider]  Omega 3 1000 MG CAPS Take 1,000 mg by mouth daily.   Yes [provider]  pantoprazole (PROTONIX) 40 MG tablet Take 40 mg by mouth daily. 09/29/19  Yes [provider]  PARoxetine (PAXIL) 20 MG tablet Take 20 mg by mouth daily. 01/12/21  Yes [provider]  rosuvastatin (CRESTOR) 20 MG tablet Take 20 mg by mouth daily. 01/12/21  Yes [provider]  traZODone (DESYREL) 100 MG tablet Take 100 mg by mouth at bedtime. 01/12/22  Yes [provider]     Kara Mead MD. FCCP. [facility] Pager : [phone]  If no response to pager , please call [phone] until 7 pm After 7:00 pm call [place]  [phone]   03/27/2022

## 2022-03-27 NOTE — Progress Notes (Signed)
FPTS Brief Progress Note  S:Went to see patient, sleeping comfortably. Did not disturb.   O: BP (!) 96/59   Pulse 70   Temp 97.6 F (36.4 C) (Oral)   Resp 15   SpO2 100%     A/P: - Plans per day team - Orders reviewed. Labs for AM ordered, which was adjusted as needed.   Holley Bouche, MD 03/27/2022, 3:30 AM PGY-1, [facility] Night Resident  Please page [phone] with questions.

## 2022-03-27 NOTE — Progress Notes (Signed)
Heart Failure Navigator Progress Note  Assessed for Heart & Vascular TOC clinic readiness.  Patient is a [facility] patient. .   Navigator available for reassessment of patient.   Earnestine Leys, BSN, Clinical cytogeneticist Only

## 2022-03-27 NOTE — Progress Notes (Signed)
Post thoracentesis chest x-ray shows right pneumothorax. Patient appears comfortable on 3 L saturating 100% , denies chest pain or shortness of breath, in fact feels improved We will repeat chest x-ray in 4 to 6 hours and monitor closely  Delaney Perona V. Elsworth Soho MD

## 2022-03-27 NOTE — Progress Notes (Signed)
Patient refused blood sugar check and insulin this morning

## 2022-03-28 ENCOUNTER — Encounter (HOSPITAL_COMMUNITY): Payer: Self-pay | Admitting: Student

## 2022-03-28 ENCOUNTER — Inpatient Hospital Stay (HOSPITAL_COMMUNITY): Payer: Medicare HMO

## 2022-03-28 DIAGNOSIS — I5022 Chronic systolic (congestive) heart failure: Secondary | ICD-10-CM

## 2022-03-28 DIAGNOSIS — J90 Pleural effusion, not elsewhere classified: Secondary | ICD-10-CM | POA: Insufficient documentation

## 2022-03-28 DIAGNOSIS — J9611 Chronic respiratory failure with hypoxia: Secondary | ICD-10-CM | POA: Diagnosis not present

## 2022-03-28 DIAGNOSIS — F411 Generalized anxiety disorder: Secondary | ICD-10-CM | POA: Insufficient documentation

## 2022-03-28 DIAGNOSIS — J189 Pneumonia, unspecified organism: Secondary | ICD-10-CM | POA: Diagnosis not present

## 2022-03-28 DIAGNOSIS — J939 Pneumothorax, unspecified: Secondary | ICD-10-CM

## 2022-03-28 DIAGNOSIS — I509 Heart failure, unspecified: Secondary | ICD-10-CM | POA: Diagnosis not present

## 2022-03-28 DIAGNOSIS — Z794 Long term (current) use of insulin: Secondary | ICD-10-CM

## 2022-03-28 DIAGNOSIS — E119 Type 2 diabetes mellitus without complications: Secondary | ICD-10-CM

## 2022-03-28 DIAGNOSIS — Z62819 Personal history of unspecified abuse in childhood: Secondary | ICD-10-CM

## 2022-03-28 DIAGNOSIS — J441 Chronic obstructive pulmonary disease with (acute) exacerbation: Secondary | ICD-10-CM

## 2022-03-28 DIAGNOSIS — F339 Major depressive disorder, recurrent, unspecified: Secondary | ICD-10-CM | POA: Insufficient documentation

## 2022-03-28 DIAGNOSIS — J9383 Other pneumothorax: Secondary | ICD-10-CM

## 2022-03-28 DIAGNOSIS — F17200 Nicotine dependence, unspecified, uncomplicated: Secondary | ICD-10-CM | POA: Insufficient documentation

## 2022-03-28 DIAGNOSIS — J9601 Acute respiratory failure with hypoxia: Secondary | ICD-10-CM

## 2022-03-28 HISTORY — DX: Personal history of unspecified abuse in childhood: Z62.819

## 2022-03-28 LAB — BASIC METABOLIC PANEL
Anion gap: 6 (ref 5–15)
BUN: 24 mg/dL — ABNORMAL HIGH (ref 8–23)
CO2: 34 mmol/L — ABNORMAL HIGH (ref 22–32)
Calcium: 8.3 mg/dL — ABNORMAL LOW (ref 8.9–10.3)
Chloride: 97 mmol/L — ABNORMAL LOW (ref 98–111)
Creatinine, Ser: 1.47 mg/dL — ABNORMAL HIGH (ref 0.44–1.00)
GFR, Estimated: 38 mL/min — ABNORMAL LOW (ref >60)
Glucose, Bld: 177 mg/dL — ABNORMAL HIGH (ref 70–99)
Potassium: 4.1 mmol/L (ref 3.5–5.1)
Sodium: 137 mmol/L (ref 135–145)

## 2022-03-28 LAB — TRIGLYCERIDES, BODY FLUIDS: Triglycerides, Fluid: 10 mg/dL

## 2022-03-28 LAB — MAGNESIUM: Magnesium: 1.8 mg/dL (ref 1.7–2.4)

## 2022-03-28 MED ORDER — FUROSEMIDE NICU IV SYRINGE 10 MG/ML
20.0000 mg | Freq: Every day | INTRAMUSCULAR | Status: DC
  Filled 2022-03-28: qty 2

## 2022-03-28 MED ORDER — SODIUM CHLORIDE 0.9 % IV SOLN
250.0000 mg | Freq: Every day | INTRAVENOUS | Status: AC
Start: 2022-03-28 — End: 2022-03-31
  Administered 2022-03-28 – 2022-03-31 (×4): 250 mg via INTRAVENOUS
  Filled 2022-03-28 (×4): qty 20

## 2022-03-28 MED ORDER — FUROSEMIDE 10 MG/ML IJ SOLN
20.0000 mg | Freq: Every day | INTRAMUSCULAR | Status: DC
Start: 2022-03-28 — End: 2022-03-29
  Administered 2022-03-28 – 2022-03-29 (×2): 20 mg via INTRAVENOUS
  Filled 2022-03-28 (×2): qty 2

## 2022-03-28 MED ORDER — FERROUS SULFATE 325 (65 FE) MG PO TABS
325.0000 mg | ORAL_TABLET | Freq: Every day | ORAL | Status: DC
  Administered 2022-04-01 – 2022-04-02 (×2): 325 mg via ORAL
  Filled 2022-03-28 (×2): qty 1

## 2022-03-28 NOTE — Progress Notes (Addendum)
Heart Failure Stewardship Pharmacist Progress Note   PCP: [name], MD PCP-Cardiologist: None    HPI:  [name] is a 71 y.o. 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   Last echo showed EF 30-35%, normal RV, abnormal septal motion consistent with LBBB, and trivial MR (03/2022). She is followed by Dr. Virgina Jock at [facility].   She presented with hypoxia. She has symptoms of hypoxia that are positional, with no cough, chest pain, or leg swelling. Pneumonia most likely given CT chest findings and respiratory status. Patient could also be suffering for CHF exacerbation given BNP and lung exam. Patient hypoxia likely multifactorial, with some component of pneumonia, and CHF exacerbation. She was discharged from the hospital on 03/18/2022 for PNA. She is currently on 3L [place].   03/26/2022 chest X-ray with right lower lobe and right middle lobe pneumonia superimposed upon chronic interstitial lung disease. CT chest on admission with new dense airspace consolidation in the medial right middle lobe and inferior right upper lobe. She underweight thoracentesis on 03/27/2022 with 1.4L of fluid removed, now with right pneumothorax.  Current HF Medications: Diuretic: none Beta Blocker: none ACE/ARB/ARNI: Losartan 12.5 mg daily Aldosterone Antagonist: none SGLT2i: Jardiance 25 mg daily  Prior to admission HF Medications: Diuretic: furosemide 20 mg PRN Beta blocker: none ACE/ARB/ARNI: losartan 25 mg daily Aldosterone Antagonist: none SGLT2i: Jardiance 25 mg daily  Pertinent Lab Values: Labs 03/28/2022: Serum creatinine 1.47, BUN 24, Potassium 4.1, Sodium 137, BNP 996.4, A1c 5.8% 03/27/2022: lung cytology and  cultures: rare WBC present, no organisms seen 02/21/2022: ferritin 51, Tsat 15%  Vital Signs: Weight: not yet documented (admission weight 124 lbs) Blood pressure: 93/50 mmHg  Heart rate: 65-85 bpm  I/O: not documented  Medication Assistance / Insurance Benefits Check: Does the patient have prescription insurance?  Yes Type of insurance plan: [employer] Medicare  Does the patient qualify for medication assistance through manufacturers or grants?   Yes Eligible grants and/or patient assistance programs: Jardiance and [employer] Medication assistance applications in progress: Jardiance and [employer] Medication assistance applications approved: none Approved medication assistance renewals will be completed by: Dr. Bonney Roussel office  Outpatient Pharmacy:  Prior to admission outpatient pharmacy: [facility] Is the patient willing to use [facility] at discharge? Yes Is the patient willing to transition their outpatient pharmacy to utilize a [facility] outpatient pharmacy?   Pending   Assessment: 1. 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 NYHA class IV symptoms. -Patient on carboplatin regimen for SCLC, which is cardiotoxic and may be contributing to her cardiomyopathy. She has also underwent chest radiation for her SCLC which is known to be cardiotoxic. EF in 12/2019 was 55-60% when she underwent [facility] which also showed CTO of RCA and it was recommended she underwent intervention, however it was medically managed. No repeat echo until 03/2022 which showed new drop to 30-35%.   -Patient is euvolemic on exam. BNP elevated, but similar to recent discharge at 996 (1600 on admission on 03/15/2022). This appears to be more of a recurrence of PNA vs. HF exacerbation would recommend holding further furosemide.  -Patient would benefit from addition of spironolactone, however defer until AKI resolved.  -SBP soft and  with  AKI (Scr 1.47, BL Scr 0.8-0.9), would recommend keeping SBP >110 to support renal perfusion. Hold losartan 12.5 mg daily.  -Patient on Jardiance 25 mg daily. Ok to continue.  -Per notes patient on beta-blocker and held by cardiologist with possible HF decompensation, however beta blocker not listed on PTA medication list or dispense report. Patient may benefit from bisoprolol given most B1 selective with known COPD on 2L [place] at [facility]. Will continue to hold for now and initiate closer to discharge.  -Patient with low ferritin and Tsat meets criteria for IV replacement. Has never received IV iron in the past. Currently on PO iron. Recommend switch to IV iron replacement for hospitalization benefit.   Plan: 1) Medication changes recommended at this time: -Hold further IV furosemide -Hold losartan until AKI resolves -Give IV iron ferric gluconate 250 mg x 4 doses  2) Patient assistance: -pending  3)  Education  - To be completed prior to discharge  Cathrine Muster, PharmD, [place] PGY2 Cardiology Pharmacy Resident

## 2022-03-28 NOTE — Plan of Care (Signed)
  Problem: Education: Goal: Ability to demonstrate management of disease process will improve Outcome: Progressing Goal: Ability to verbalize understanding of medication therapies will improve Outcome: Progressing Goal: Individualized Educational Video(s) Outcome: Progressing   Problem: Activity: Goal: Capacity to carry out activities will improve Outcome: Progressing   Problem: Cardiac: Goal: Ability to achieve and maintain adequate cardiopulmonary perfusion will improve Outcome: Progressing   Problem: Coping: Goal: Ability to adjust to condition or change in health will improve Outcome: Progressing   Problem: Fluid Volume: Goal: Ability to maintain a balanced intake and output will improve Outcome: Progressing   Problem: Health Behavior/Discharge Planning: Goal: Ability to identify and utilize available resources and services will improve Outcome: Progressing Goal: Ability to manage health-related needs will improve Outcome: Progressing   Problem: Metabolic: Goal: Ability to maintain appropriate glucose levels will improve Outcome: Progressing   Problem: Nutritional: Goal: Maintenance of adequate nutrition will improve Outcome: Progressing Goal: Progress toward achieving an optimal weight will improve Outcome: Progressing   Problem: Skin Integrity: Goal: Risk for impaired skin integrity will decrease Outcome: Progressing   Problem: Tissue Perfusion: Goal: Adequacy of tissue perfusion will improve Outcome: Progressing   Problem: Education: Goal: Knowledge of General Education information will improve Description: Including pain rating scale, medication(s)/side effects and non-pharmacologic comfort measures Outcome: Progressing   Problem: Health Behavior/Discharge Planning: Goal: Ability to manage health-related needs will improve Outcome: Progressing   Problem: Clinical Measurements: Goal: Ability to maintain clinical measurements within normal limits will  improve Outcome: Progressing Goal: Will remain free from infection Outcome: Progressing Goal: Diagnostic test results will improve Outcome: Progressing Goal: Respiratory complications will improve Outcome: Progressing Goal: Cardiovascular complication will be avoided Outcome: Progressing   Problem: Activity: Goal: Risk for activity intolerance will decrease Outcome: Progressing   Problem: Nutrition: Goal: Adequate nutrition will be maintained Outcome: Progressing   Problem: Coping: Goal: Level of anxiety will decrease Outcome: Progressing   Problem: Pain Managment: Goal: General experience of comfort will improve Outcome: Progressing   Problem: Safety: Goal: Ability to remain free from injury will improve Outcome: Progressing

## 2022-03-28 NOTE — Progress Notes (Signed)
Patient refusing blood sugar checks and insulin. Pt verbalized understanding of the risks and benefits of BG monitoring

## 2022-03-28 NOTE — Progress Notes (Signed)
Daily Progress Note Intern Pager: [phone]  Patient name: [name] Medical record number: [id number] Date of birth: 04/19/1951 Age: 71 y.o. Gender: female  Primary Care Provider: [name], MD Consultants: Pulmonology, cardiology Code Status: Full  Pt Overview and Major Events to Date:  6/12-admitted 6/13: Thoracentesis   Assessment and Plan:  [name] is a 71 year old female presents with hypoxia in the setting of pneumonia and pleural effusion.   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   * Pneumonia Afebrile overnight and on exam have diminished breath sound on the right lungs. MRSA swan negative and abx narrowed and currently on IV cefepime day 2/7 treatment. -Pulmonology following, appreciate recs -Continue IV cefepime.  Acute respiratory failure with hypoxia ([place]) Patient is currently at baseline of 3L [place] with reassuring O2 sats. Had thoracentesis completed by pulmonology yesterday. Repeat CXR this morning showed slight interval increase in right pneumothax. On exam has diminished breath sound on the right.  -Pulmonology following appreciate recs -O2 sat goals 88-92   Acute decompensation of Heart Failure mid-range Ejection Fraction Patient's most recent echo shows EF 30-35% this month.  Transudative finding of thoracentesis fluid study. No crackles on exam. Discussed finsings with cards who recommend daily IV 26m lasix and holding losartan. -Cardiology consulted, appreciate recs -hold Losartan and BB per Cards -Daily IV lasix 260m- Strict I's and O's - Daily weights - Continuous cardiac monitoring  Small cell lung cancer, right middle lobe ([facility]  diagnosed 2021 with metatesis to brain. She is s/p 4 cycles of chemotherapy, with concurrent whole brain radiation treatment completed 09/15/20. In remission since June 2022.  IDA (iron deficiency  anemia) Hemoglobin is 9.6 this morning and asymptomatic.  Home medication includes ferrous sulfate 325 daily.  -Give IV iron infusion. -continue home med -transfusion threshold 8  Acute on CKD (chronic kidney disease), stage IIIa Creatinine this morning increased from 1.47 to 2.1 yesterday. Her baseline is ~0.9. Worsening AKI is likely due to intravascular volume depletion 2/2 to pleura effusion. -Avoid nephrotoxic agent -Daily BMP  Benign hypertension with CKD (chronic kidney disease) stage III ([facility] had intermittent hypotension since admission yesterday. BP range 90-127/46-89 with some low MAPs to 50s. She reports BP usually run low and symptomatic with occasional dizziness but not overnight. Home med include losartan 25 mg daily -Hold home meds, consider restarting when appropriate  COPD with acute exacerbation ([place] is currently at baseline an endorses improved dyspnea. On 3L [place] cannula overnight with stable O2 sats. Home meds include Trelegy Ellipta, albuterol  -Continue Trellergy -O2 sats 88-92%  Insulin dependent type 2 diabetes mellitus (HCC) H blood glucose this morning was 177.  home medication include metformin 500 BID and Jardiance 25 mg daily. -sSSI -Monitor glucose with BMP -Hold home meds   FEN/GI: Carb modified diet PPx: Lovenox Dispo:Home pending clinical improvement . Barriers include clinical status.  Subjective:  [name] she is doing well and feeling better today.  No complaint  Objective: Temp:  [98.2 F (36.8 C)-98.3 F (36.8 C)] 98.2 F (36.8 C) (06/14 0832) Pulse Rate:  [67-85] 85 (06/14 0832) Resp:  [17-20] 18 (06/14 0832) BP: (90-127)/(46-89) 93/50 (06/14 0832) SpO2:  [94 %-100 %] 100 % (06/14 0832) Physical Exam: General: Alert, well appearing, NAD CV: RRR, no murmurs, normal S1/S2 Pulm:  good WOB on 3L [place], diminished breath sounds on right lungs. Abd: Soft,  no distension, no tenderness Skin: dry, warm Ext: No BLE  edema   Laboratory: Most recent CBC Lab Results  Component Value Date   WBC 4.4 03/27/2022   HGB 9.4 (L) 03/27/2022   HCT 31.6 (L) 03/27/2022   MCV 95.5 03/27/2022   PLT 162 03/27/2022   Most recent BMP    Latest Ref Rng & Units 03/28/2022   12:43 AM  BMP  Glucose 70 - 99 mg/dL 177   BUN 8 - 23 mg/dL 24   Creatinine 0.44 - 1.00 mg/dL 1.47   Sodium 135 - 145 mmol/L 137   Potassium 3.5 - 5.1 mmol/L 4.1   Chloride 98 - 111 mmol/L 97   CO2 22 - 32 mmol/L 34   Calcium 8.9 - 10.3 mg/dL 8.3     Imaging/Diagnostic Tests: DG CHEST PORT 1 VIEW  Result Date: 03/28/2022 CLINICAL DATA:  Follow-up hydropneumothorax. EXAM: PORTABLE CHEST 1 VIEW COMPARISON:  Radiographs earlier the same date and 03/27/2022. CT 03/26/2022. FINDINGS: 1352 hours. Previously demonstrated right-sided pneumothorax is slightly smaller with partial re-expansion of the right upper lobe. There is no mediastinal shift. Underlying interstitial opacities in both lungs are stable, likely due to fibrosis. There is probably a small amount of pleural fluid bilaterally. No acute osseous findings. IMPRESSION: Interval decreased size of right-sided hydropneumothorax without mediastinal shift. 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 Electronically Signed   By: Richardean Sale M.D.   On: 03/28/2022 14:08   DG Chest Port 1 View  Result Date: 03/28/2022 CLINICAL DATA:  Acute respiratory failure EXAM: PORTABLE CHEST 1 VIEW COMPARISON:  Chest radiograph 1 day prior FINDINGS: Cardiomediastinal silhouette is stable The right pneumothorax appears slightly increased in size since the study from 1 day prior. There is no leftward mediastinal shift. Patchy opacities in the left lung base have worsened in the interim, with a likely small left pleural effusion. There is no significant right effusion. The bones are stable. IMPRESSION: 1. Slight interval increase in size of the right pneumothorax with no leftward mediastinal  shift. 2. Worsening opacities in the left lung base with a small left pleural effusion. These results will be called to the ordering clinician or representative by the Radiologist Assistant, and communication documented in the PACS or [employer]. Electronically Signed   By: Valetta Mole M.D.   On: 03/28/2022 08:26     Alen Bleacher, MD 03/28/2022, 2:48 PM  PGY-1, [place] Intern pager: [phone], text pages welcome Secure chat group [place]

## 2022-03-28 NOTE — Progress Notes (Signed)
Daily Progress Note Intern Pager: [phone]  Patient name: [name] Medical record number: [id number] Date of birth: 06-08-1951 Age: 71 y.o. Gender: female  Primary Care Provider: [name], MD Consultants: Pulmonology, cardiology Code Status: Full  Pt Overview and Major Events to Date:  6/12-admitted 6/13- Thoracentesis performed 6/15-chest tube placement  Assessment and Plan: [name] is a 71 year old female who presents with hypoxia, found to have moderate pleural effusion.  Pertinent PMH/PSH includes T2DM, CHF, COPD, liver cirrhosis, small cell lung cancer with brain metastasis, GERD. .  * Acute respiratory failure with hypoxia [facility]) Patient is s/p thoracentesis with transudative pleural effusion likely of cardiac etiology. Her CXR this morning showed worsening pneumothorax. Pulmonology following, who performed chest tube placement today. -Pulmonology follow, appreciate recs -Cardiology following, appreciate rec -Continue 20 mg IV Lasix daily -O2 sat goals 88-92%  Pneumonia Patient is afebrile overnight and WBC is wnl.  She denies any chest pain.  We will continue IV antibiotics. -Pulmonology following, appreciate recs -Continue IV cefepime.  Acute decompensation of Heart Failure mid-range Ejection Fraction Patient is s/p thoracentesis with transudative pleural effusion likely of cardiac etiology. Mg this morning was 1.8 repleted with 2g magnesium sulfate.  Continue IV lasix per card. -Cardiology consulted, appreciate recs -hold Losartan and BB per Cards -Daily IV lasix 31m - Strict I's and O's - Daily weights - Continuous cardiac monitoring   IDA (iron deficiency anemia) Hemoglobin this morning was 9.4, patient is s/p iron infusion yesterday for cardiac benefit.  Home medication includes ferrous sulfate 325 daily. -continue home med -transfusion threshold 8  Acute on CKD (chronic kidney disease), stage IIIa Creatinine this morning was 1.21 and  slightly improved from yesterday.  Baseline ~0.9 -Avoid nephrotoxic agent -Daily BMP  Benign hypertension with CKD (chronic kidney disease) stage III (HCC) Patient's BP in 24 hours ranged from 93-139/50-84.  BP at baseline usually run low. -Hold home meds, consider restarting when appropriate  COPD with acute exacerbation ([facility] Patient currently at 3L [place] which is her home baseline. Home meds include Trelegy Ellipta, albuterol  -Continue Trellergy -O2 sats 88-92%  Insulin dependent type 2 diabetes mellitus ([place] She has blood glucose this morning was 121.  Home medication include metformin 500 BID and Jardiance 25 mg daily. -sSSI -Monitor glucose with BMP -Hold home meds   FEN/GI: Carb modified diet PPx: Lovenox Dispo:Home pending clinical improvement . Barriers include chest tube placement.   Subjective:  [name] laying in bed comfortably and reports doing well.  Reports having heartburn but otherwise no other complaint.    Objective: Temp:  [97.3 F (36.3 C)-98.3 F (36.8 C)] 98 F (36.7 C) (06/15 1044) Pulse Rate:  [71-86] 71 (06/15 1310) Resp:  [16-24] 20 (06/15 1310) BP: (101-139)/(58-84) 102/65 (06/15 1310) SpO2:  [92 %-100 %] 100 % (06/15 1310) Weight:  [55.7 kg] 55.7 kg (06/15 0500) Physical Exam: General: Alert, well appearing, NAD CV: RRR, no murmurs, normal S1/S2 Pulm:  good WOB on 3 L nasal cannula Abd: Soft, no distension, no tenderness Skin: dry, warm Ext: No BLE edema, +2 Pedal and radial pulse.   Laboratory: Most recent CBC Lab Results  Component Value Date   WBC 4.4 03/27/2022   HGB 9.4 (L) 03/27/2022   HCT 31.6 (L) 03/27/2022   MCV 95.5 03/27/2022   PLT 162 03/27/2022   Most recent BMP    Latest Ref Rng & Units 03/29/2022    4:18 AM  BMP  Glucose 70 - 99  mg/dL 121   BUN 8 - 23 mg/dL 26   Creatinine 0.44 - 1.00 mg/dL 1.42   Sodium 135 - 145 mmol/L 137   Potassium 3.5 - 5.1 mmol/L 3.8   Chloride 98 - 111 mmol/L 98   CO2 22 - 32  mmol/L 30   Calcium 8.9 - 10.3 mg/dL 8.5     Imaging/Diagnostic Tests: DG CHEST PORT 1 VIEW  Result Date: 03/29/2022 CLINICAL DATA:  Right chest tube placement EXAM: PORTABLE CHEST 1 VIEW COMPARISON:  Radiograph 03/30/2022 FINDINGS: Interval placement of a right apical pigtail chest tube. Decreased pneumothorax with trace residual pleural gas in the right apex. Persistent bilateral interstitial and airspace disease, and small bilateral pleural effusions. IMPRESSION: Trace residual right apical pneumothorax after chest tube placement. Persistent bilateral interstitial and airspace disease and small bilateral pleural effusions. Electronically Signed   By: Maurine Simmering M.D.   On: 03/29/2022 14:04   DG CHEST PORT 1 VIEW  Result Date: 03/29/2022 CLINICAL DATA:  Right pneumothorax EXAM: PORTABLE CHEST 1 VIEW COMPARISON:  Previous studies including the examination done earlier today FINDINGS: There is interval removal of right chest tube. There is slight increase in size of right pneumothorax. Transverse diameter of heart is increased. Increased interstitial markings in both lungs may suggest scarring. There is small left pleural effusion. Surgical clips are seen in the right upper quadrant of abdomen. IMPRESSION: There is slight increase in size of right pneumothorax after removal of right chest tube. Cardiomegaly. Left pleural effusion. Increased interstitial markings in both lungs suggest scarring. Possibility of superimposed interstitial pneumonia is not excluded. Electronically Signed   By: Elmer Picker M.D.   On: 03/29/2022 12:25   DG CHEST PORT 1 VIEW  Result Date: 03/29/2022 CLINICAL DATA:  Chest tube placement EXAM: PORTABLE CHEST 1 VIEW COMPARISON:  03/29/2022 at 0815 hours FINDINGS: Interval placement of right-sided pigtail pleural drainage catheter. Re-expansion of the right lung with trace residual right-sided pneumothorax, less than 5% volume. Hazy opacities within the right lung, likely  atelectasis. Persistent left basilar opacity with small left pleural effusion. No left-sided pneumothorax. Trachea is midline. Stable cardiomegaly. Aortic atherosclerosis. IMPRESSION: Interval placement of right-sided chest tube with re-expansion of the right lung, trace residual right-sided pneumothorax, less than 5% volume. Electronically Signed   By: Davina Poke D.O.   On: 03/29/2022 09:55   DG Chest Port 1 View  Addendum Date: 03/29/2022   ADDENDUM REPORT: 03/29/2022 08:51 ADDENDUM: Findings discussed with Dr. Elsworth Soho via telephone at 8:45 a.m. Electronically Signed   By: Margaretha Sheffield M.D.   On: 03/29/2022 08:51   Result Date: 03/29/2022 CLINICAL DATA:  Chest radiograph March 28, 2021. EXAM: PORTABLE CHEST 1 VIEW COMPARISON:  Chest radiograph March 28, 2022. FINDINGS: Interval increase in right-sided pneumothorax, now large. There is evidence of leftward mediastinal shift. Adjacent compressive atelectasis in the right lung. Redemonstrated left lung base opacities with suspected small left pleural effusion. Similar size of the cardiomediastinal silhouette. Polyarticular degenerative change. Call of report is in progress at the time of dictation. IMPRESSION: 1. Interval increase in right-sided pneumothorax, now large. There is evidence of leftward mediastinal shift, raising concern for potential tension. 2. Redemonstrated left lung base opacities with small left pleural effusion. Electronically Signed: By: Margaretha Sheffield M.D. On: 03/29/2022 08:34    Alen Bleacher, MD 03/29/2022, 3:03 PM  PGY-1, [place] Intern pager: [phone], text pages welcome Secure chat group [place]

## 2022-03-28 NOTE — Evaluation (Signed)
Physical Therapy Evaluation & Discharge  Patient Details Name: [name] MRN: [id number] DOB: 12-22-1950 Today's Date: 03/28/2022  History of Present Illness  Pt is a 71yo F admitted on 03/26/22 with worsening dyspnea and O2 sats in the 50s. Found to have R ptx and s/p R thoracentesis. Pt was recently admitted to [facility] on 6/1 and treated for pneumonia. PMH: small cell lung cancer, CHF, CKD, COPD, DM, chronic respiratory failure, hypothyroidism, HLD, GERD  Clinical Impression  Prior to admission, pt was modI with ambulation using a rollator and requiring assistance from girlfriend with IADLs. Pt is currently supervision for all mobility and ambulated 338f with rollator. O2 sats at 96% on 3L after ambulation. Pt states she feels she is at her functional baseline and has enough support to safely discharge home. Pt encouraged to continue ambulating throughout inpatient stay. PT will sign off on this patient, but if pt has a decline in functional status please reconsult.      Recommendations for follow up therapy are one component of a multi-disciplinary discharge planning process, led by the attending physician.  Recommendations may be updated based on patient status, additional functional criteria and insurance authorization.  Follow Up Recommendations No PT follow up    Assistance Recommended at Discharge PRN  Patient can return home with the following  Assist for transportation;Assistance with cooking/housework    Equipment Recommendations None recommended by PT  Recommendations for Other Services       Functional Status Assessment Patient has had a recent decline in their functional status and demonstrates the ability to make significant improvements in function in a reasonable and predictable amount of time.     Precautions / Restrictions Precautions Precautions: None Restrictions Weight Bearing Restrictions: No      Mobility  Bed Mobility Overal bed mobility:  Modified Independent             General bed mobility comments: HOB elevated, use of bed rails, no physical assist required    Transfers Overall transfer level: Needs assistance Equipment used: None Transfers: Sit to/from Stand Sit to Stand: Supervision           General transfer comment: supervision for safety and line management    Ambulation/Gait Ambulation/Gait assistance: Supervision Gait Distance (Feet): 300 Feet Assistive device: Rollator (4 wheels) Gait Pattern/deviations: Step-through pattern, Decreased stride length, Trunk flexed Gait velocity: decreased Gait velocity interpretation: 1.31 - 2.62 ft/sec, indicative of limited community ambulator   General Gait Details: No LOB noted, supervision for safety, pt states she feels she is ambualting at baseline  SScience writer   Modified Rankin (Stroke Patients Only)       Balance Overall balance assessment: Mild deficits observed, not formally tested                                           Pertinent Vitals/Pain Pain Assessment Pain Assessment: No/denies pain    Home Living Family/patient expects to be discharged to:: Private residence Living Arrangements: Spouse/significant other Available Help at Discharge: Family Type of Home: House Home Access: Ramped entrance       Home Layout: One level Home Equipment: Rollator (4 wheels)      Prior Function Prior Level of Function : Needs assist       Physical Assist : ADLs (physical)  ADLs (physical): IADLs (Pts girlfriend assists with IADLS) Mobility Comments: uses rollator for longer distances       Hand Dominance   Dominant Hand: Left    Extremity/Trunk Assessment   Upper Extremity Assessment Upper Extremity Assessment: Overall WFL for tasks assessed    Lower Extremity Assessment Lower Extremity Assessment: Overall WFL for tasks assessed    Cervical / Trunk Assessment Cervical /  Trunk Assessment: Kyphotic  Communication   Communication: No difficulties  Cognition Arousal/Alertness: Awake/alert Behavior During Therapy: WFL for tasks assessed/performed Overall Cognitive Status: Within Functional Limits for tasks assessed                                          General Comments      Exercises     Assessment/Plan    PT Assessment Patient does not need any further PT services  PT Problem List         PT Treatment Interventions      PT Goals (Current goals can be found in the Care Plan section)  Acute Rehab PT Goals Patient Stated Goal: to regain strength PT Goal Formulation: All assessment and education complete, DC therapy    Frequency       Co-evaluation               AM-PAC PT "6 Clicks" Mobility  Outcome Measure Help needed turning from your back to your side while in a flat bed without using bedrails?: None Help needed moving from lying on your back to sitting on the side of a flat bed without using bedrails?: None Help needed moving to and from a bed to a chair (including a wheelchair)?: A Little Help needed standing up from a chair using your arms (e.g., wheelchair or bedside chair)?: A Little Help needed to walk in hospital room?: A Little Help needed climbing 3-5 steps with a railing? : A Little 6 Click Score: 20    End of Session Equipment Utilized During Treatment: Gait belt;Oxygen Activity Tolerance: Patient tolerated treatment well Patient left: in bed;with call bell/phone within reach Nurse Communication: Mobility status PT Visit Diagnosis: Muscle weakness (generalized) (M62.81);Difficulty in walking, not elsewhere classified (R26.2)    Time: 3668-1594 PT Time Calculation (min) (ACUTE ONLY): 31 min   Charges:   PT Evaluation $PT Eval Low Complexity: 1 Low PT Treatments $Therapeutic Activity: 8-22 mins      Mackie Pai, SPT Acute Rehabilitation Services  Office: [phone]   Mackie Pai 03/28/2022, 11:28 AM

## 2022-03-28 NOTE — Hospital Course (Addendum)
[name] is a 71 y.o. female who was admitted to the [facility] Teaching Service at [address] [facility] for acute hypoxic respiratory failure. Hospital course is outlined below by system.    Acute respiratory failure with hypoxia Presented to the ED due to worsening dyspnea and initial work-up includes CTA and checks x-ray which showed large sized pleural effusion.  Pulmonology was consulted who performed thoracentesis (6/13) that drained about 1.4L of transudative fluid suggestive of cardia cause. Cardiology was consulted (see below for for CHF management).  Serial chest x-ray after thoracentesis showed worsening pneumothorax and chest tube was placed on 6/15 and removed 6/16. Subsequent CXR was stable and pulmonology plan out patient follow up scheduled for 6/26.  Discharge patient denies dyspnea and at baseline 3L [place] with good work of breathing and reassuring O2 sats.  Significant events 6/13 Right thoracentesis -1400 cc of fluid removed , ex vacuo pneumothorax 6/13 pleural fluid results > transudative 6/14 pneumothorax improved 03/29/2022 right chest tube placed 03/30/2022 right chest tube removed   HFrEF with elevated BNP BNP on admission was 996.4 and most recent echo 2 months ago showed EF of 30- 35%.  Given high history of HFrEF and transudative pleural effusion, her dyspnea was most likely of cardiac etiology.  Cardiology was consulted who recommend PRN IV Lasix, reducing Jardiance to 31m daily and holding BB until follow up with Cardiology outpatient  Pneumonia Checks x-ray on admission showed right middle lobe infiltrate consistent with pneumonia.  Patient was suspected to have failed outpatient treatment for community-acquired pneumonia.  Was started on empirical antibiotic initially broad-spectrum but narrowed after MRSA swab was negative.  She completed 5 days of antibiotics for which she was treated with IV cefepime..Marland Kitchen  PCP follow-up recommendations Reduced Jardiance to 10 mg daily   Will start beta blocker outpatient after follow up with cardiology. Hold off on Entresto, cardiology concerned about tolerance Lasix  254mdaily PRN per cards

## 2022-03-28 NOTE — Progress Notes (Signed)
NAME:  [name], MRN:  [id number], DOB:  04-14-1951, LOS: 2 ADMISSION DATE:  03/26/2022, CONSULTATION DATE:  03/28/2022  REFERRING MD:  Nori Riis, MD, CHIEF COMPLAINT: Shortness of breath  History of Present Illness:  71 year old with COPD on home oxygen 3 L and history of small cell lung cancer admitted with shortness of breath for a few days and right-sided pleuritic chest pain around the right breast for 2 days. She was seen 5/26 and 6/1 in the office and treated with prednisone taper and antibiotics She was hospitalized 6/1 to 6/4 for community-acquired pneumonia  Admission chest x-ray showed right middle lower lobe consolidation, CT chest showed consolidation in the right middle lobe and inferior right upper lobe with partially loculated right-sided pleural effusion. Hence PCCM consulted.  Oxygen saturation on admission was 92% on 3 L.  She was started on vancomycin and cefepime BNP was 996 Rash was noted on the dorsum of her right foot  Pertinent  Medical History  HFrEF -EF 30 to 35%, LBBB COPD on home oxygen, 3 L, follows with Dr. 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  Unable to do lung volume testing  CT chest with contrast 10/2021 -decrease in size of right middle lobe lesion from 2.7 x 2.0 cm to 1.8 x 1.5 cm  Significant Hospital Events: Including procedures, antibiotic start and stop dates in addition to other pertinent events   6/13 Right thoracentesis -1400 cc of fluid removed , ex vacuo pneumothorax 6/13 pleural fluid results >> transudative, 79% lymphocytes  Interim History / Subjective:   Continues to have right-sided chest pain. Saturation 100% on 2 L  Objective   Blood pressure (!)  93/50, pulse 85, temperature 98.2 F (36.8 C), temperature source Oral, resp. rate 18, SpO2 100 %.        Intake/Output Summary (Last 24 hours) at 03/28/2022 1025 Last data filed at 03/28/2022 0947 Gross per 24 hour  Intake 480 ml  Output --  Net 480 ml    There were no vitals filed for this visit.  Examination: General: Elderly woman, lying supine, no distress HENT: Mild pallor, no icterus, no JVD Lungs: Crackles left base, decreased breath sounds right, no accessory muscle use Cardiovascular: S1-S2 regular, no murmur Abdomen: Soft, nontender, no hepatosplenomegaly Extremities: No edema, no rash Neuro: Alert, oriented, nonfocal   Labs show normal electrolytes, increasing creatinine to 1.5  Resolved Hospital Problem list     Assessment & Plan:  Right middle and inferior right upper lobe consolidation -Failed treatment for CAP -This is her second admission for the same,  being treated for  HAP with cefepime   Right pneumothorax -appears ex-vacuo Chest x-ray 6/14 independently reviewed shows slight increase, no mediastinal shift to suggest tension, patient is tolerating well -We will repeat chest x-ray this afternoon and if no improvement then consider pigtail chest tube,   Right pleural effusion - -Thoracentesis 6/12 removal of 1.4 L of fluid. -Appears transudative perhaps due to heart failure  Chronic respiratory failure with hypoxia ILD - significant airway obstruction on PFTs and hypoxia is more likely on the basis of pulmonary fibrosis.  Not considered to be a candidate for antifibrotic's due to liver disease  Best Practice (right click  and "Reselect all SmartList Selections" daily)   Per primary service Code Status:  full code   Labs   CBC: Recent Labs  Lab 03/26/22 1333 03/27/22 0135  WBC 6.4 4.4  NEUTROABS 5.1  --   HGB 10.3* 9.4*  HCT 35.0* 31.6*  MCV 96.7 95.5  PLT 189 162     Basic Metabolic Panel: Recent Labs  Lab 03/26/22 1333  03/27/22 0135 03/28/22 0043  NA 139 138 137  K 4.8 3.9 4.1  CL 98 96* 97*  CO2 34* 33* 34*  GLUCOSE 165* 102* 177*  BUN 12 14 24*  CREATININE 1.16* 1.21* 1.47*  CALCIUM 9.6 8.9 8.3*  MG  --   --  1.8    GFR: Estimated Creatinine Clearance: 31.8 mL/min (A) (by C-G formula based on SCr of 1.47 mg/dL (H)). Recent Labs  Lab 03/26/22 1333 03/27/22 0135  WBC 6.4 4.4  LATICACIDVEN  --  0.6     Liver Function Tests: Recent Labs  Lab 03/26/22 1333 03/27/22 0958  AST 14*  --   ALT 13  --   ALKPHOS 53  --   BILITOT 0.9  --   PROT 6.4*  --   ALBUMIN 3.1* 2.9*    No results for input(s): "LIPASE", "AMYLASE" in the last 168 hours. No results for input(s): "AMMONIA" in the last 168 hours.  ABG    Component Value Date/Time   PHART 7.413 09/04/2014 2018   PCO2ART 41.8 09/04/2014 2018   PO2ART 54.3 (L) 09/04/2014 2018   HCO3 26.1 (H) 09/04/2014 2018   TCO2 23.7 09/04/2014 2018   ACIDBASEDEF 4.4 (H) 01/01/2013 0450   O2SAT 89.8 09/04/2014 2018     Coagulation Profile: No results for input(s): "INR", "PROTIME" in the last 168 hours.  Cardiac Enzymes: No results for input(s): "CKTOTAL", "CKMB", "CKMBINDEX", "TROPONINI" in the last 168 hours.  HbA1C: Hgb A1c MFr Bld  Date/Time Value Ref Range Status  03/15/2022 09:36 PM 5.8 (H) 4.8 - 5.6 % Final    Comment:    (NOTE) Pre diabetes:          5.7%-6.4%  Diabetes:              >6.4%  Glycemic control for   <7.0% adults with diabetes   03/01/2021 05:06 AM 6.5 (H) 4.8 - 5.6 % Final    Comment:    (NOTE) Pre diabetes:          5.7%-6.4%  Diabetes:              >6.4%  Glycemic control for   <7.0% adults with diabetes     CBG: No results for input(s): "GLUCAP" in the last 168 hours.  Kara Mead MD. Shade Flood. [facility] Pager : [phone]  If no response to pager , please call [phone] until 7 pm After 7:00 pm call Elink  [phone]   03/28/2022

## 2022-03-28 NOTE — Progress Notes (Signed)
OT Screen Note  Patient Details Name: [name] MRN: [id number] DOB: 21-Nov-1950   Cancelled Treatment:    Reason Eval/Treat Not Completed: OT screened, no needs identified, will sign off. Ot spoke with patient and provided energy conservation handout. Pt reading handout and expressed understanding of energy conservation handout.   Fleeta Emmer, OTR/L  Acute Rehabilitation Services Office: [phone] .  Jeri Modena 03/28/2022, 3:15 PM

## 2022-03-29 ENCOUNTER — Inpatient Hospital Stay (HOSPITAL_COMMUNITY): Payer: Medicare HMO

## 2022-03-29 ENCOUNTER — Telehealth: Payer: Self-pay

## 2022-03-29 DIAGNOSIS — J9383 Other pneumothorax: Secondary | ICD-10-CM

## 2022-03-29 DIAGNOSIS — J90 Pleural effusion, not elsewhere classified: Secondary | ICD-10-CM

## 2022-03-29 DIAGNOSIS — J939 Pneumothorax, unspecified: Secondary | ICD-10-CM

## 2022-03-29 DIAGNOSIS — J9601 Acute respiratory failure with hypoxia: Secondary | ICD-10-CM

## 2022-03-29 LAB — BASIC METABOLIC PANEL
Anion gap: 9 (ref 5–15)
BUN: 26 mg/dL — ABNORMAL HIGH (ref 8–23)
CO2: 30 mmol/L (ref 22–32)
Calcium: 8.5 mg/dL — ABNORMAL LOW (ref 8.9–10.3)
Chloride: 98 mmol/L (ref 98–111)
Creatinine, Ser: 1.42 mg/dL — ABNORMAL HIGH (ref 0.44–1.00)
GFR, Estimated: 40 mL/min — ABNORMAL LOW (ref >60)
Glucose, Bld: 121 mg/dL — ABNORMAL HIGH (ref 70–99)
Potassium: 3.8 mmol/L (ref 3.5–5.1)
Sodium: 137 mmol/L (ref 135–145)

## 2022-03-29 LAB — MAGNESIUM: Magnesium: 1.8 mg/dL (ref 1.7–2.4)

## 2022-03-29 MED ORDER — SODIUM CHLORIDE 0.9% FLUSH
10.0000 mL | Freq: Three times a day (TID) | INTRAVENOUS | Status: DC
  Administered 2022-03-29 – 2022-04-02 (×10): 10 mL

## 2022-03-29 MED ORDER — FENTANYL CITRATE PF 50 MCG/ML IJ SOSY
PREFILLED_SYRINGE | INTRAMUSCULAR | Status: AC
  Administered 2022-03-29: 50 ug via INTRAVENOUS
  Filled 2022-03-29: qty 1

## 2022-03-29 MED ORDER — MAGNESIUM SULFATE 2 GM/50ML IV SOLN
2.0000 g | Freq: Once | INTRAVENOUS | Status: AC
  Administered 2022-03-29: 2 g via INTRAVENOUS
  Filled 2022-03-29: qty 50

## 2022-03-29 MED ORDER — TRAMADOL HCL 50 MG PO TABS: 50.0000 mg | ORAL_TABLET | Freq: Four times a day (QID) | ORAL | Status: DC | PRN

## 2022-03-29 MED ORDER — ACETAMINOPHEN 325 MG PO TABS
650.0000 mg | ORAL_TABLET | ORAL | Status: DC | PRN
  Administered 2022-03-30: 650 mg via ORAL
  Filled 2022-03-29: qty 2

## 2022-03-29 MED ORDER — FENTANYL CITRATE PF 50 MCG/ML IJ SOSY: 50.0000 ug | PREFILLED_SYRINGE | Freq: Once | INTRAMUSCULAR | Status: AC

## 2022-03-29 MED ORDER — FENTANYL CITRATE PF 50 MCG/ML IJ SOSY
50.0000 ug | PREFILLED_SYRINGE | Freq: Once | INTRAMUSCULAR | Status: AC
  Administered 2022-03-29: 50 ug via INTRAVENOUS

## 2022-03-29 MED ORDER — FENTANYL CITRATE PF 50 MCG/ML IJ SOSY
PREFILLED_SYRINGE | INTRAMUSCULAR | Status: AC
  Administered 2022-03-29: 50 ug
  Filled 2022-03-29: qty 1

## 2022-03-29 NOTE — Care Management Important Message (Signed)
Important Message  Patient Details  Name: [name] MRN: [id number] Date of Birth: January 27, 1951   Medicare Important Message Given:  Yes     Orbie Pyo 03/29/2022, 2:27 PM

## 2022-03-29 NOTE — Progress Notes (Signed)
Assisted Dr Elsworth Soho with Chest tube placement.  17mg Fentanyl given IV.  Patient tolerated well.

## 2022-03-29 NOTE — Progress Notes (Addendum)
Subjective:  Has pain at the chest tube site Breathing better Asks, "am I going to die?"  Objective:  Vital Signs in the last 24 hours: Temp:  [98.2 F (36.8 C)-98.3 F (36.8 C)] 98.3 F (36.8 C) (06/15 0655) Pulse Rate:  [75-86] 75 (06/15 0655) Resp:  [17-18] 18 (06/15 0655) BP: (93-139)/(50-84) 109/67 (06/15 0655) SpO2:  [97 %-100 %] 99 % (06/15 0751) Weight:  [55.7 kg] 55.7 kg (06/15 0500)  Intake/Output from previous day: 06/14 0701 - 06/15 0700 In: 8 [P.O.:960; IV Piggyback:670] Out: -   Physical Exam Vitals and nursing note reviewed.  Constitutional:      General: She is not in acute distress.    Appearance: She is underweight.  Neck:     Vascular: No JVD.  Cardiovascular:     Rate and Rhythm: Normal rate and regular rhythm.     Heart sounds: Normal heart sounds. No murmur heard. Pulmonary:     Effort: Pulmonary effort is normal.     Breath sounds: Normal breath sounds. No wheezing or rales.  Chest:     Comments: Right sided chest tube in place Musculoskeletal:     Right lower leg: No edema.     Left lower leg: No edema.     Cardiac Studies:  Imaging/tests reviewed and independently interpreted:  CXR 03/30/2022: 1. Stable RIGHT perihilar masslike changes and increased interstitial markings. 2. Subtle lucency at the RIGHT lower chest may reflect small anterior or basilar pneumothorax though on this upright view no definitive signs of apical component. Chest tube in stable position. 3. Stable interstitial changes in the lungs.  Echocardiogram 03/16/2022:  1. Limited study, only parasternal images and few apical images as  patient declined further imaging   2. Left ventricular ejection fraction, by estimation, is 30 to 35%. The  left ventricle has moderately decreased function. The left ventricle  demonstrates regional wall motion abnormalities. Abnormal (paradoxical)  septal motion, consistent with left bundle branch block.      The left ventricular  internal cavity size was moderately dilated. Left  ventricular diastolic parameters are indeterminate.   3. Right ventricular systolic function is normal. The right ventricular  size is mildly enlarged.   4. The mitral valve is normal in structure. Trivial mitral valve  regurgitation. No evidence of mitral stenosis.   5. The aortic valve is tricuspid. There is mild calcification of the  aortic valve. Aortic valve regurgitation is not visualized. Unable to  assess for aortic stenosis, gradients were not measured   Assessment & Recommendations:  71 y.o. 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 Clinically, she still appears fairly euvolemic, in spite of elevated BNP.    HFrEF: Etiology could be CAD, as well as chest radiation. She currently does not have any angina symptoms. She is known to have mid RCA CTO (Cath 2021), that was medically treated given her comorbidities as well h/o GI bleed then. Given her GI bleeding history, anemia, multiple comorbidities, and failure to thrive, she remains a poor candidate for any form of revascularization, even if she were to have obstructive CAD.  I do not recommend diagnostic coronary angiogram. I recommend medical therapy alone for her cardiomyopathy.  GDMT for HFrEF has been on hold due to soft blood pressures. Given improvement in BP and Cr, resume losartan 12.5 mg today. I am not sure she will tolerate Entresto at  this time. Okay to continue Jardiance, consider reducing to 10 mg. Will start beta blocker probably outpatient. Can use lasix 20 mg daily prn   CAD: As above. Continue Aspirin, statin.   Patient and caregiver/friend [name] asked re: hospice. Patient has multiple medical illnesses that could be terminal, but she has lived nearly 2 years since initial diagnosis of cancer. Recent decompensation is due to HFrEF and lung issues. I do not know if she will qualify for hospice at this time but could certainly try. That said, she certainly needs help at home. She lives alone, [name] provides care but has limitations. She definitely wants to be home. Maybe home health will be an option.I appreciate the primary team for looking into various options for her post discharge care, whenever that would be.   I am away over the weekend. Given her relative stability from cardiac standpoint, Dr. Terri Skains will peripherally follow her over the weekend. I will be back on Monday. If any urgent questions till then, please reach out to Dr. Terri Skains.    Discussed interpretation of tests and management recommendations with the primary team   Nigel Mormon, MD Pager: [phone] Office: [phone]'

## 2022-03-29 NOTE — Progress Notes (Signed)
Looks euvolemic. Okay to discharge on Jardiance 25 mg only as heart failure med. Will resume the rest outpatient.  Will arrange f/u.   Nigel Mormon, MD Pager: [phone] Office: [phone]

## 2022-03-29 NOTE — Procedures (Signed)
Insertion of Chest Tube Procedure Note  [name]  [id number]  [birth year]  Date:03/29/22  Time:1:38 PM    Provider Performing: Leanna Sato. Melecio Cueto   Procedure: Chest Tube Insertion (15520)  Indication(s) Pneumothorax She accidentally ripped out her chest tube and IV when she suddenly got up for the bathroom.  Repeat chest x-ray showed recurrence of pneumothorax and chest tube had to be reinserted   Consent Risks of the procedure as well as the alternatives and risks of each were explained to the patient and/or caregiver.  Consent for the procedure was obtained and is signed in the bedside chart  Anesthesia Topical only with 1% lidocaine    Time Out Verified patient identification, verified procedure, site/side was marked, verified correct patient position, special equipment/implants available, medications/allergies/relevant history reviewed, required imaging and test results available.   Sterile Technique Maximal sterile technique including full sterile barrier drape, hand hygiene, sterile gown, sterile gloves, mask, hair covering, sterile ultrasound probe cover (if used).   Procedure Description Ultrasound not used to identify appropriate pleural anatomy for placement and overlying skin marked. Area of placement cleaned and draped in sterile fashion.  A 39F French pigtail pleural catheter was placed into the right pleural space using Seldinger technique. Appropriate return of air was obtained.  The tube was connected to atrium and placed on -20 cm H2O wall suction.   Complications/Tolerance None; patient tolerated the procedure well. Chest X-ray is ordered to verify placement.   EBL Minimal  Specimen(s) none   [name] V. Elsworth Soho MD

## 2022-03-29 NOTE — Progress Notes (Signed)
Heart Failure Stewardship Pharmacist Progress Note   PCP: [name], MD PCP-Cardiologist: None    HPI:  [name] is a 71 y.o. female with PMH significant for COPD, chronic respiratory failure on supplemental oxygen via [place] at 2 LPM, ILD, history of small cell carcinoma of the lung with brain metastasis s/p irradiation, CAD s/p stent placement and RCA CTO (2021), T2DM and deficiency anemia, hypothyroidism, hyperlipidemia, CKD 3, GERD.   Last echo showed EF 30-35%, normal RV, abnormal septal motion consistent with LBBB, and trivial MR (03/2022). She is followed by Dr. Virgina Jock at [facility].   She presented with hypoxia. She has symptoms of hypoxia that are positional, with no cough, chest pain, or leg swelling. Pneumonia most likely given CT chest findings and respiratory status. Patient could also be suffering for CHF exacerbation given BNP and lung exam. Patient hypoxia likely multifactorial, with some component of pneumonia, and CHF exacerbation. She was discharged from the hospital on 03/18/2022 for PNA. She is currently on 3L [place].   03/26/2022 chest X-ray with right lower lobe and right middle lobe pneumonia superimposed upon chronic interstitial lung disease. CT chest on admission with new dense airspace consolidation in the medial right middle lobe and inferior right upper lobe. She underweight thoracentesis on 03/27/2022 with 1.4L of fluid removed, now with right pneumothorax.  Current HF Medications: Diuretic: furosemide 20 mg IV daily Beta Blocker: none ACE/ARB/ARNI: none (Losartan 12.5 mg daily held with AKI) Aldosterone Antagonist: none SGLT2i: Jardiance 25 mg daily  Prior to admission HF Medications: Diuretic: furosemide 20 mg PRN Beta blocker: none ACE/ARB/ARNI: losartan 25 mg daily Aldosterone Antagonist: none SGLT2i: Jardiance 25 mg daily  Pertinent Lab Values: Labs 03/28/2022: Serum creatinine 1.42, BUN 24, Potassium 3.8, Sodium 137, BNP 996.4, A1c  5.8% 03/27/2022: lung cytology and cultures: rare WBC present, no organisms seen 02/21/2022: ferritin 51, Tsat 15%  Vital Signs: Weight: 122 lbs (admission weight 124 lbs) Blood pressure: 101/58 mmHg  Heart rate: 75 bpm  I/O: not documented  Medication Assistance / Insurance Benefits Check: Does the patient have prescription insurance?  Yes Type of insurance plan: [employer] Medicare  Does the patient qualify for medication assistance through manufacturers or grants?   Yes Eligible grants and/or patient assistance programs: Jardiance and Entresto Medication assistance applications in progress: Jardiance and Entresto Medication assistance applications approved: none Approved medication assistance renewals will be completed by: Dr. Bonney Roussel office  Outpatient Pharmacy:  Prior to admission outpatient pharmacy: [facility] Is the patient willing to use [facility] at discharge? Yes Is the patient willing to transition their outpatient pharmacy to utilize a [facility] outpatient pharmacy?   Pending   Assessment: 1. 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 NYHA class IV symptoms. -Patient on carboplatin regimen for SCLC, which is cardiotoxic and may be contributing to her cardiomyopathy. She has also underwent chest radiation for her SCLC which is known to be cardiotoxic. EF in 12/2019 was 55-60% when she underwent [facility] which also showed CTO of RCA and it was recommended she underwent intervention, however it was medically managed. No repeat echo until 03/2022 which showed new drop to 30-35%.   -Patient is euvolemic on exam. BNP elevated, but similar to recent discharge at 996 (1600 on admission on 03/15/2022). This appears to be more of a recurrence of PNA vs. HF exacerbation would recommend holding further furosemide.  -Patient would benefit from addition of spironolactone, however defer until AKI  resolved.  -SBP soft and with AKI (Scr 1.42, BL Scr 0.8-0.9), would recommend keeping SBP >110 to support renal perfusion. Continue to hold losartan 12.5 mg daily.  -Patient on Jardiance 25 mg daily. Ok to continue.  -Per notes patient on beta-blocker and held by cardiologist with possible HF decompensation, however beta blocker not listed on PTA medication list or dispense report. Patient may benefit from bisoprolol given most B1 selective with known COPD on 2L [place] at [facility]. -Patient with low ferritin and Tsat meets criteria for IV replacement. Has never received IV iron in the past. Currently on PO iron. Continue IV iron replacement for hospitalization benefit.   Plan: 1) Medication changes recommended at this time: -Consider adding bisoprolol 2.5 mg daily at follow-up  2) Patient assistance: -completed 03/29/2022  3)  Education  - Patient has been educated on current HF medications and potential additions to HF medication regimen - Patient verbalizes understanding that over the next few months, these medication doses may change and more medications may be added to optimize HF regimen - Patient has been educated on basic disease state pathophysiology and goals of therapy   Cathrine Muster, PharmD, [place] PGY2 Cardiology Pharmacy Resident

## 2022-03-29 NOTE — Procedures (Signed)
Insertion of Chest Tube Procedure Note  [name]  [id number]  12/18/1950  Date:03/29/22  Time:9:31 AM    Provider Performing: Leanna Sato. [name]   Procedure: Chest Tube Insertion (60677)  Indication(s) Pneumothorax  Consent Risks of the procedure as well as the alternatives and risks of each were explained to the patient and/or caregiver.  Consent for the procedure was obtained and is signed in the bedside chart  Anesthesia Topical only with 1% lidocaine    Time Out Verified patient identification, verified procedure, site/side was marked, verified correct patient position, special equipment/implants available, medications/allergies/relevant history reviewed, required imaging and test results available.   Sterile Technique Maximal sterile technique including full sterile barrier drape, hand hygiene, sterile gown, sterile gloves, mask, hair covering, sterile ultrasound probe cover (if used).   Procedure Description Ultrasound not used to identify appropriate pleural anatomy for placement and overlying skin marked. Area of placement cleaned and draped in sterile fashion.  A 14 French pigtail pleural catheter was placed into the right pleural space using Seldinger technique. Appropriate return of air was obtained.  The tube was connected to atrium and placed on -20 cm H2O wall suction.   Complications/Tolerance None; patient tolerated the procedure well. Chest X-ray is ordered to verify placement.   EBL Minimal  Specimen(s) none  [name] V. [name] Soho MD

## 2022-03-29 NOTE — Progress Notes (Signed)
  Pigtail chest tube placed on right with good reexpansion of the lung. Unfortunately, this got dislodged when she suddenly got up to use the bathroom.  Chest x-ray showed that pneumothorax recurred.  Pigtail was replaced and repeat chest x-ray again shows good expansion  Patient educated to use the call bell when she needs assistance  Manny Vitolo V. Elsworth Soho MD

## 2022-03-29 NOTE — TOC Initial Note (Signed)
Transition of Care [facility]) - Initial/Assessment Note    Patient Details  Name: [name] MRN: [id number] Date of Birth: 1951/04/07  [facility] CM/SW Contact:    Marilu Favre, RN Phone Number: 03/29/2022, 12:40 PM  Clinical Narrative:                  Spoke to patient at bedside. Confirmed face sheet information.   PCP Allyn Kenner.  Patient lives with her friend. Patient has transportation to appointments.   Patient has home oxygen , she is unsure of the name of the agency oxygen is through but at discharge friend can bring portable oxygen to hospital.  Expected Discharge Plan: Home/Self Care Barriers to Discharge: Continued Medical Work up   Patient Goals and CMS Choice Patient states their goals for this hospitalization and ongoing recovery are:: to return to home CMS Medicare.gov Compare Post Acute Care list provided to:: Patient Choice offered to / list presented to : Patient  Expected Discharge Plan and Services Expected Discharge Plan: Home/Self Care   Discharge Planning Services: CM Consult Post Acute Care Choice: [place] arrangements for the past 2 months: Single Family Home                 DME Arranged: N/A         HH Arranged: NA          Prior Living Arrangements/Services Living arrangements for the past 2 months: Single Family Home Lives with:: Friends Patient language and need for interpreter reviewed:: Yes Do you feel safe going back to the place where you live?: Yes      Need for Family Participation in Patient Care: Yes (Comment) Care giver support system in place?: Yes (comment) Current home services: DME Criminal Activity/Legal Involvement Pertinent to Current Situation/Hospitalization: No - Comment as needed  Activities of Daily Living      Permission Sought/Granted   Permission granted to share information with : No              Emotional Assessment Appearance:: Appears stated  age Attitude/Demeanor/Rapport: Engaged Affect (typically observed): Accepting Orientation: : Oriented to Self, Oriented to Place, Oriented to  Time, Oriented to Situation Alcohol / Substance Use: Not Applicable Psych Involvement: No (comment)  Admission diagnosis:  Pleural effusion [J90] Hypoxia [R09.02] HCAP (healthcare-associated pneumonia) [J18.9] Congestive heart failure, unspecified HF chronicity, unspecified heart failure type [facility]) [I50.9] Patient Active Problem List   Diagnosis Date Noted   Heart failure with mid-range ejection fraction ([place]) 03/28/2022   Generalized anxiety disorder 03/28/2022   Nicotine dependence, unspecified, uncomplicated 09/32/6712   Recurrent major depression ([place]) 03/28/2022   Pleural effusion    Rash of foot 03/27/2022   Pneumonia 03/27/2022   Acute decompensation of Heart Failure mid-range Ejection Fraction 03/26/2022   Acute respiratory failure with hypoxia ([place]) 03/15/2022   Type 2 diabetes mellitus with hyperglycemia ([place]) 03/15/2022   History of lung cancer 03/15/2022   Chronic respiratory failure with hypoxia ([place]) 03/09/2022   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 ([place]) 03/27/2021   Acute on chronic combined systolic and diastolic CHF (congestive heart failure) ([place]) 02/28/2021   Brain metastases    Small cell lung cancer, right middle lobe ([place]) 05/30/2020   Encounter for antineoplastic chemotherapy 05/30/2020   Abnormal weight loss 05/17/2020   IDA (iron deficiency anemia) 02/15/2020   Cirrhosis of liver ([place]) 02/15/2020   Acute on CKD (chronic kidney disease), stage IIIa 01/09/2020   Hx of heart artery stent  Benign hypertension with CKD (chronic kidney disease) stage III (HCC)    Former smoker    Hepatomegaly 03/31/2014   Esophageal dysphagia 03/31/2014   Unspecified constipation 03/31/2014   ETOH abuse 09/28/2013   Hyperkalemia 06/05/2013   Insomnia due to mental disorder 09/02/2012   Insulin dependent type 2 diabetes mellitus ([place])  06/16/2010   Coronary artery disease/Prior Stents 1997 and 2000/RCA occlusion 12/2019 06/16/2010   MICROALBUMINURIA 06/01/2009   ACTINIC KERATOSIS, HEAD 04/20/2009   BACK PAIN 08/04/2008   DIARRHEA, CHRONIC 08/04/2008   TOBACCO ABUSE 06/03/2007   IBS 06/03/2007   ANXIETY STATE NOS 11/26/2006   Acquired hypothyroidism 11/01/2006   Mixed hyperlipidemia 11/01/2006   Depression 11/01/2006   PERIPHERAL NEUROPATHY 11/01/2006   ALLERGIC RHINITIS 11/01/2006   COPD with acute exacerbation ([place]) 11/01/2006   GERD 11/01/2006   DEGENERATION, DISC NOS 11/01/2006   LOW BACK PAIN 11/01/2006   PCP:  [name], MD Pharmacy:   [place], [place] [address] Phone: [phone] Fax: [place], [place] [address] Phone: [phone] Fax: [phone]  [place], [place] - [address] [place] [address] [place] [address] Phone: [phone] Fax: [phone]     Social Determinants of Health (SDOH) Interventions    Readmission Risk Interventions     No data to display

## 2022-03-29 NOTE — Progress Notes (Signed)
Assisted Dr Elsworth Soho with Chest Tube placement.  Total 130mg Fentanly given IV.  Patient tolerated well.  Time out complete

## 2022-03-29 NOTE — Progress Notes (Signed)
Mobility Specialist Progress Note:   03/29/22 1700  Mobility  Activity Transferred to/from [facility]  Level of Assistance Standby assist, set-up cues, supervision of patient - no hands on  Assistive Device None  Distance Ambulated (ft) 2 ft  Activity Response Tolerated well  $Mobility charge 1 Mobility   Pt requesting to go to [facility]. Transferred to [facility] d/t urine frequency/urgency. Pt only requiring assistance with lines/chest tube. Left on BSC with NT present.   Nelta Numbers Acute Rehab Secure Chat or Office Phone: [phone]

## 2022-03-29 NOTE — Telephone Encounter (Signed)
Pts caregiver Alexis Frock called and stated that she would like to know the estimated life expectancy and ask if we could have hospice come to the pts house one she is discharged from the hospital. Please advise.

## 2022-03-29 NOTE — Progress Notes (Signed)
FPTS Brief Progress Note  S:Went to bedside to see patient. Patient sleeping, did not disturb   O: BP 139/84 (BP Location: Right Arm)   Pulse 86   Temp 98.3 F (36.8 C) (Oral)   Resp 17   SpO2 97%     A/P: - Plans per day team - Orders reviewed. Labs for AM ordered, which was adjusted as needed.   Holley Bouche, MD 03/29/2022, 2:24 AM PGY-1, [facility] Night Resident  Please page [phone] with questions.

## 2022-03-29 NOTE — Progress Notes (Signed)
NAME:  [name], MRN:  [id number], DOB:  02/08/1951, LOS: 3 ADMISSION DATE:  03/26/2022, CONSULTATION DATE:  03/29/2022  REFERRING MD:  Nori Riis, MD, CHIEF COMPLAINT: Shortness of breath  History of Present Illness:  71 year old with COPD on home oxygen 3 L and history of small cell lung cancer admitted with shortness of breath for a few days and right-sided pleuritic chest pain around the right breast for 2 days. She was seen 5/26 and 6/1 in the office and treated with prednisone taper and antibiotics She was hospitalized 6/1 to 6/4 for community-acquired pneumonia  Admission chest x-ray showed right middle lower lobe consolidation, CT chest showed consolidation in the right middle lobe and inferior right upper lobe with partially loculated right-sided pleural effusion. Hence PCCM consulted.  Oxygen saturation on admission was 92% on 3 L.  She was started on vancomycin and cefepime BNP was 996 Rash was noted on the dorsum of her right foot  Pertinent  Medical History  HFrEF -EF 30 to 35%, LBBB COPD on home oxygen, 3 L, follows with Dr. [name]  ILD, probable UIP Small cell lung cancer diagnosed 2021, status post 4 cycles of chemo and whole brain radiation 09/2020 for brain mets Cirrhosis of liver, on imaging CKD stage II, baseline creatinine was 0.8 Iron deficiency anemia   05/12/2020 PFTs: FEV1 2.2 (87%), FEV1% 77, DLCO 58%.  Unable to do lung volume testing  CT chest with contrast 10/2021 -decrease in size of right middle lobe lesion from 2.7 x 2.0 cm to 1.8 x 1.5 cm  Significant Hospital Events: Including procedures, antibiotic start and stop dates in addition to other pertinent events   6/13 Right thoracentesis -1400 cc of fluid removed , ex vacuo pneumothorax 6/13 pleural fluid results >> transudative, 79% lymphocytes 6/14 pneumothorax improved  Interim History / Subjective:   No chest pain or dyspnea Saturation 95% on 3 L nasal cannula  Objective    Blood pressure 102/66, pulse 73, temperature 98 F (36.7 C), temperature source Oral, resp. rate 16, weight 55.7 kg, SpO2 100 %.        Intake/Output Summary (Last 24 hours) at 03/29/2022 1135 Last data filed at 03/29/2022 0600 Gross per 24 hour  Intake 1270 ml  Output --  Net 1270 ml    Filed Weights   03/29/22 0500  Weight: 55.7 kg    Examination: General: Elderly woman, lying supine, no distress HENT: Mild pallor, no icterus, no JVD Lungs: No accessory muscle use, decreased breath sounds on right, trachea midline Cardiovascular: No murmur, S1-S2 regular Abdomen: Soft, nontender, no hepatosplenomegaly Extremities: No edema, no rash Neuro: Alert, oriented, nonfocal   Labs show normal electrolytes, creatinine stable at 1.4  Chest x-ray 6/15 independently reviewed shows increasing right pneumothorax  Resolved Hospital Problem list     Assessment & Plan:  Right middle and inferior right upper lobe consolidation -Failed treatment for CAP -This is her second admission for the same,  being treated for  HAP with cefepime , plan for 5 days  Right pneumothorax -ex-vacuo -Right pigtail chest tube placed with good release of air, chest x-ray shows good reexpansion of right lung -Mild air leak present with good pleural variation -Place chest tube to 20 cm suction -Repeat chest x-ray in a.m. -Tylenol and tramadol as needed for pain   Right pleural effusion - -Thoracentesis 6/12 removal of 1.4 L of fluid. -Appears transudative perhaps due to heart failure -Await cytology  Chronic respiratory failure with hypoxia  ILD - no significant airway obstruction on PFTs and hypoxia is more likely on the basis of pulmonary fibrosis.  Not considered  a candidate for antifibrotic's due to liver disease  HFrEF -per cardiology and primary team  Best Practice (right click and "Reselect all SmartList Selections" daily)   Per primary service Code Status:  full code   Labs    CBC: Recent Labs  Lab 03/26/22 1333 03/27/22 0135  WBC 6.4 4.4  NEUTROABS 5.1  --   HGB 10.3* 9.4*  HCT 35.0* 31.6*  MCV 96.7 95.5  PLT 189 162     Basic Metabolic Panel: Recent Labs  Lab 03/26/22 1333 03/27/22 0135 03/28/22 0043 03/29/22 0418  NA 139 138 137 137  K 4.8 3.9 4.1 3.8  CL 98 96* 97* 98  CO2 34* 33* 34* 30  GLUCOSE 165* 102* 177* 121*  BUN 12 14 24* 26*  CREATININE 1.16* 1.21* 1.47* 1.42*  CALCIUM 9.6 8.9 8.3* 8.5*  MG  --   --  1.8 1.8    GFR: Estimated Creatinine Clearance: 32.4 mL/min (A) (by C-G formula based on SCr of 1.42 mg/dL (H)). Recent Labs  Lab 03/26/22 1333 03/27/22 0135  WBC 6.4 4.4  LATICACIDVEN  --  0.6     Liver Function Tests: Recent Labs  Lab 03/26/22 1333 03/27/22 0958  AST 14*  --   ALT 13  --   ALKPHOS 53  --   BILITOT 0.9  --   PROT 6.4*  --   ALBUMIN 3.1* 2.9*    No results for input(s): "LIPASE", "AMYLASE" in the last 168 hours. No results for input(s): "AMMONIA" in the last 168 hours.  ABG    Component Value Date/Time   PHART 7.413 09/04/2014 2018   PCO2ART 41.8 09/04/2014 2018   PO2ART 54.3 (L) 09/04/2014 2018   HCO3 26.1 (H) 09/04/2014 2018   TCO2 23.7 09/04/2014 2018   ACIDBASEDEF 4.4 (H) 01/01/2013 0450   O2SAT 89.8 09/04/2014 2018     Coagulation Profile: No results for input(s): "INR", "PROTIME" in the last 168 hours.  Cardiac Enzymes: No results for input(s): "CKTOTAL", "CKMB", "CKMBINDEX", "TROPONINI" in the last 168 hours.  HbA1C: Hgb A1c MFr Bld  Date/Time Value Ref Range Status  03/15/2022 09:36 PM 5.8 (H) 4.8 - 5.6 % Final    Comment:    (NOTE) Pre diabetes:          5.7%-6.4%  Diabetes:              >6.4%  Glycemic control for   <7.0% adults with diabetes   03/01/2021 05:06 AM 6.5 (H) 4.8 - 5.6 % Final    Comment:    (NOTE) Pre diabetes:          5.7%-6.4%  Diabetes:              >6.4%  Glycemic control for   <7.0% adults with diabetes     CBG: No results for  input(s): "GLUCAP" in the last 168 hours.  Kara Mead MD. [place] Flood. [place] [facility] Pager : [phone]  If no response to pager , please call [phone] until 7 pm After 7:00 pm call Elink  [phone]   03/29/2022

## 2022-03-29 NOTE — Telephone Encounter (Signed)
Discussed with patient and caregiver [name]. Both in agreement that patient will need at least some help at home, whether home health or rehab. Or even palliative care, but Im not sure she would qualify for hospice.   Nigel Mormon, MD Pager: [phone] Office: [phone]

## 2022-03-30 ENCOUNTER — Inpatient Hospital Stay (HOSPITAL_COMMUNITY): Payer: Medicare HMO

## 2022-03-30 ENCOUNTER — Telehealth: Payer: Self-pay | Admitting: Internal Medicine

## 2022-03-30 DIAGNOSIS — J95811 Postprocedural pneumothorax: Secondary | ICD-10-CM

## 2022-03-30 LAB — BASIC METABOLIC PANEL
Anion gap: 8 (ref 5–15)
BUN: 22 mg/dL (ref 8–23)
CO2: 29 mmol/L (ref 22–32)
Calcium: 8.7 mg/dL — ABNORMAL LOW (ref 8.9–10.3)
Chloride: 102 mmol/L (ref 98–111)
Creatinine, Ser: 1.19 mg/dL — ABNORMAL HIGH (ref 0.44–1.00)
GFR, Estimated: 49 mL/min — ABNORMAL LOW (ref >60)
Glucose, Bld: 101 mg/dL — ABNORMAL HIGH (ref 70–99)
Potassium: 3.9 mmol/L (ref 3.5–5.1)
Sodium: 139 mmol/L (ref 135–145)

## 2022-03-30 LAB — CHOLESTEROL, BODY FLUID: Cholesterol, Fluid: 14 mg/dL

## 2022-03-30 LAB — BODY FLUID CULTURE W GRAM STAIN: Culture: NO GROWTH

## 2022-03-30 LAB — MAGNESIUM: Magnesium: 2.5 mg/dL — ABNORMAL HIGH (ref 1.7–2.4)

## 2022-03-30 MED ORDER — LOSARTAN POTASSIUM 25 MG PO TABS
12.5000 mg | ORAL_TABLET | Freq: Every day | ORAL | Status: DC
  Administered 2022-03-30 – 2022-04-02 (×4): 12.5 mg via ORAL
  Filled 2022-03-30 (×4): qty 1

## 2022-03-30 NOTE — Progress Notes (Signed)
FPTS Brief Progress Note  S:Went bedside to see patient, patient sleeping. Did not disturb   O: BP 121/70 (BP Location: Left Arm)   Pulse 79   Temp 98.1 F (36.7 C) (Oral)   Resp 18   Wt 55.7 kg   SpO2 99%   BMI 19.82 kg/m     A/P: - Plans per day team - Orders reviewed. Labs for AM ordered, which was adjusted as needed.   Holley Bouche, MD 03/30/2022, 4:03 AM PGY-1, [facility] Night Resident  Please page [phone] with questions.

## 2022-03-30 NOTE — Progress Notes (Signed)
PCCM   Chest tube removed at the direction of Dr. Tamala Julian.   Clean technique was utilized including handwashing and gloves.  Sutures removed and tube pulled revealing intact pigtail tip.  Sterile gauze placed on the insertion site and overlayed with occlusive Tegaderm dressing.    Leave dressing in place for 72 hours. Do not submerge or expose to excess moisture during that time.    Georgann Housekeeper, AGACNP-BC [facility]  See Amion for personal pager PCCM on call pager [phone] until 7pm. Please call Elink 7p-7a. [phone]  03/30/2022 3:24 PM

## 2022-03-30 NOTE — Progress Notes (Signed)
NAME:  [name], MRN:  [id number], DOB:  April 04, 1951, LOS: 4 ADMISSION DATE:  03/26/2022, CONSULTATION DATE:  03/30/2022  REFERRING MD:  Nori Riis, MD, CHIEF COMPLAINT: Shortness of breath  History of Present Illness:  71 year old with COPD on home oxygen 3 L and history of small cell lung cancer admitted with shortness of breath for a few days and right-sided pleuritic chest pain around the right breast for 2 days. She was seen 5/26 and 6/1 in the office and treated with prednisone taper and antibiotics She was hospitalized 6/1 to 6/4 for community-acquired pneumonia  Admission chest x-ray showed right middle lower lobe consolidation, CT chest showed consolidation in the right middle lobe and inferior right upper lobe with partially loculated right-sided pleural effusion. Hence PCCM consulted.  Oxygen saturation on admission was 92% on 3 L.  She was started on vancomycin and cefepime BNP was 996 Rash was noted on the dorsum of her right foot  Pertinent  Medical History  HFrEF -EF 30 to 35%, LBBB COPD on home oxygen, 3 L, follows with Dr. [name] at [facility]  ILD, probable UIP Small cell lung cancer diagnosed 2021, status post 4 cycles of chemo and whole brain radiation 09/2020 for brain mets Cirrhosis of liver, on imaging CKD stage II, baseline creatinine was 0.8 Iron deficiency anemia   05/12/2020 PFTs: FEV1 2.2 (87%), FEV1% 77, DLCO 58%.  Unable to do lung volume testing  CT chest with contrast 10/2021 -decrease in size of right middle lobe lesion from 2.7 x 2.0 cm to 1.8 x 1.5 cm  Significant Hospital Events: Including procedures, antibiotic start and stop dates in addition to other pertinent events   6/13 Right thoracentesis -1400 cc of fluid removed , ex vacuo pneumothorax 6/13 pleural fluid results >> transudative, 79% lymphocytes 6/14 pneumothorax improved 03/29/2022 right chest tube placed 03/30/2022 right chest tube has been clamped  Interim History / Subjective:    No acute distress Objective   Blood pressure 123/79, pulse 77, temperature 98.2 F (36.8 C), temperature source Oral, resp. rate 17, weight 50.6 kg, SpO2 99 %.        Intake/Output Summary (Last 24 hours) at 03/30/2022 0908 Last data filed at 03/30/2022 0656 Gross per 24 hour  Intake 858.38 ml  Output 1200 ml  Net -341.62 ml   Filed Weights   03/29/22 0500 03/30/22 0500  Weight: 55.7 kg 50.6 kg    Examination: General: Thin frail female no acute distress HEENT: MM pink/moist s no JVD or lymphadenopathy Neuro: Grossly intact without focal defect CV: Heart sounds are regular PULM: Intubated chest tube right without airleak 20 cm  GI: soft, bsx4 active  GU: Extremities: warm/dry, negative edema, right hand. Skin: no rashes or lesions    Labs show normal electrolytes, creatinine stable at 1.4  Chest x-ray 03/30/2022 chest tube in good position  Resolved Hospital Problem list     Assessment & Plan:     Right pleural effusion - Thoracentesis of the right 03/26/2022 with 1.4 L of fluid Pneumothorax status post thoracentesis with chest tube placement 04/01/2022 03/30/2022 chest tube Check chest x-ray Work towards removal of chest tube in the near future Await cytology Chronic respiratory failure with hypoxia ILD - no significant airway obstruction on PFTs and hypoxia is more likely on the basis of pulmonary fibrosis.  Not considered  a candidate for antifibrotic's due to liver disease  Per cardiology  Best Practice (right click and "Reselect all SmartList Selections" daily)   Per  primary service Code Status:  full code   Labs   CBC: Recent Labs  Lab 03/26/22 1333 03/27/22 0135  WBC 6.4 4.4  NEUTROABS 5.1  --   HGB 10.3* 9.4*  HCT 35.0* 31.6*  MCV 96.7 95.5  PLT 189 268    Basic Metabolic Panel: Recent Labs  Lab 03/26/22 1333 03/27/22 0135 03/28/22 0043 03/29/22 0418 03/30/22 0258  NA 139 138 137 137 139  K 4.8 3.9 4.1 3.8 3.9  CL 98 96* 97*  98 102  CO2 34* 33* 34* 30 29  GLUCOSE 165* 102* 177* 121* 101*  BUN 12 14 24* 26* 22  CREATININE 1.16* 1.21* 1.47* 1.42* 1.19*  CALCIUM 9.6 8.9 8.3* 8.5* 8.7*  MG  --   --  1.8 1.8 2.5*   GFR: Estimated Creatinine Clearance: 35.1 mL/min (A) (by C-G formula based on SCr of 1.19 mg/dL (H)). Recent Labs  Lab 03/26/22 1333 03/27/22 0135  WBC 6.4 4.4  LATICACIDVEN  --  0.6    Liver Function Tests: Recent Labs  Lab 03/26/22 1333 03/27/22 0958  AST 14*  --   ALT 13  --   ALKPHOS 53  --   BILITOT 0.9  --   PROT 6.4*  --   ALBUMIN 3.1* 2.9*   No results for input(s): "LIPASE", "AMYLASE" in the last 168 hours. No results for input(s): "AMMONIA" in the last 168 hours.  ABG    Component Value Date/Time   PHART 7.413 09/04/2014 2018   PCO2ART 41.8 09/04/2014 2018   PO2ART 54.3 (L) 09/04/2014 2018   HCO3 26.1 (H) 09/04/2014 2018   TCO2 23.7 09/04/2014 2018   ACIDBASEDEF 4.4 (H) 01/01/2013 0450   O2SAT 89.8 09/04/2014 2018     Coagulation Profile: No results for input(s): "INR", "PROTIME" in the last 168 hours.  Cardiac Enzymes: No results for input(s): "CKTOTAL", "CKMB", "CKMBINDEX", "TROPONINI" in the last 168 hours.  HbA1C: Hgb A1c MFr Bld  Date/Time Value Ref Range Status  03/15/2022 09:36 PM 5.8 (H) 4.8 - 5.6 % Final    Comment:    (NOTE) Pre diabetes:          5.7%-6.4%  Diabetes:              >6.4%  Glycemic control for   <7.0% adults with diabetes   03/01/2021 05:06 AM 6.5 (H) 4.8 - 5.6 % Final    Comment:    (NOTE) Pre diabetes:          5.7%-6.4%  Diabetes:              >6.4%  Glycemic control for   <7.0% adults with diabetes     CBG: No results for input(s): "GLUCAP" in the last 168 hours.  Richardson Landry Barkley Kratochvil ACNP Acute Care Nurse Practitioner [place] Please consult Amion 03/30/2022, 9:08 AM

## 2022-03-30 NOTE — Progress Notes (Signed)
Lung remains up, will set up OP f/u appt.  Available PRN.  Erskine Emery MD

## 2022-03-30 NOTE — Telephone Encounter (Signed)
Patient scheduled on 04/13/2022 at 11:30am with Eric Form, NP- reminder mailed to address on file- nothing further needed.

## 2022-03-30 NOTE — Plan of Care (Signed)
  Problem: Coping: Goal: Ability to adjust to condition or change in health will improve Outcome: Progressing

## 2022-03-30 NOTE — Progress Notes (Signed)
Heart Failure Stewardship Pharmacist Progress Note   PCP: [name], MD PCP-Cardiologist: None    HPI:  [name] is a 71 y.o. 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   Last echo showed EF 30-35%, normal RV, abnormal septal motion consistent with LBBB, and trivial MR (03/2022). She is followed by Dr. Virgina Jock at [facility].   She presented with hypoxia. She has symptoms of hypoxia that are positional, with no cough, chest pain, or leg swelling. Pneumonia most likely given CT chest findings and respiratory status. Patient could also be suffering for CHF exacerbation given BNP and lung exam. Patient hypoxia likely multifactorial, with some component of pneumonia, and CHF exacerbation. She was discharged from the hospital on 03/18/2022 for PNA. She is currently on 3L [place].   03/26/2022 chest X-ray with right lower lobe and right middle lobe pneumonia superimposed upon chronic interstitial lung disease. CT chest on admission with new dense airspace consolidation in the medial right middle lobe and inferior right upper lobe. She underweight thoracentesis on 03/27/2022 with 1.4L of fluid removed, now with right pneumothorax.Has chest tube inserted.   Current HF Medications: Diuretic: none Beta Blocker: none ACE/ARB/ARNI: Losartan 12.5 mg daily Aldosterone Antagonist: none SGLT2i: Jardiance 25 mg daily  Prior to admission HF Medications: Diuretic: furosemide 20 mg PRN Beta blocker: none ACE/ARB/ARNI: losartan 25 mg daily Aldosterone Antagonist: none SGLT2i: Jardiance 25 mg daily  Pertinent Lab Values: Labs 03/30/2022: Serum creatinine 1.19, BUN 22, Potassium 3.9, Sodium 139, BNP 996.4, A1c 5.8% 03/27/2022:  lung cytology and cultures: rare WBC present, no organisms seen to date 02/21/2022: ferritin 51, Tsat 15%  Vital Signs: Weight: wt inaccurate today (admission weight 124 lbs) Blood pressure: 120/70-80 mmHg  Heart rate: 75 bpm  I/O: not documented  Medication Assistance / Insurance Benefits Check: Does the patient have prescription insurance?  Yes Type of insurance plan: [employer] Medicare  Does the patient qualify for medication assistance through manufacturers or grants?   Yes Eligible grants and/or patient assistance programs: [employer]ard[employer] and [employer] Medication assistance applications in progress: Jardiance and [employer] Medication assistance applications approved: none Approved medication assistance renewals will be completed by: Dr. Bonney Roussel office  Outpatient Pharmacy:  Prior to admission outpatient pharmacy: [facility] Is the patient willing to use [facility] at discharge? Yes Is the patient willing to transition their outpatient pharmacy to utilize a [facility] outpatient pharmacy?   Pending   Assessment: 1. 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 NYHA class IV symptoms. -Patient on carboplatin regimen for SCLC, which is cardiotoxic and may be contributing to her cardiomyopathy. She has also underwent chest radiation for her SCLC which is known to be cardiotoxic. EF in 12/2019 was 55-60% when she underwent [facility] which also showed CTO of RCA and it was recommended she underwent intervention, however it was medically managed. No repeat echo until 03/2022 which showed new drop to 30-35%.   -Patient is euvolemic on exam. BNP elevated, but similar to recent discharge at 996 (1600 on admission on 03/15/2022). This appears to be more of a recurrence of PNA vs. HF exacerbation would recommend holding further furosemide.  -Patient would benefit from addition of spironolactone, however defer until AKI  resolved.  -SBP ok and with Scr trending down after AKI, losartan 12.5 mg daily resumed today. (Scr 1.19, BL Scr 0.8-0.9), would recommend keeping SBP >110 to support renal perfusion.  -Patient on Jardiance 25 mg daily. Ok to continue.  -Per notes patient on beta-blocker and held by cardiologist with possible HF decompensation, however beta blocker not listed on PTA medication list or dispense report. Patient may benefit from bisoprolol given most B1 selective with known COPD on 2L [place] at [facility]. -Patient with low ferritin and Tsat meets criteria for IV replacement. Has never received IV iron in the past. Currently on PO iron. Continue IV iron replacement for hospitalization benefit.   Plan: 1) Medication changes recommended at this time: -Consider adding bisoprolol 2.5 mg daily at follow-up -Consider spironolactone 12.5 mg daily, if Scr returns to BL at follow-up  2) Patient assistance: -completed 03/29/2022  3)  Education  - Patient has been educated on current HF medications and potential additions to HF medication regimen - Patient verbalizes understanding that over the next few months, these medication doses may change and more medications may be added to optimize HF regimen - Patient has been educated on basic disease state pathophysiology and goals of therapy   Cathrine Muster, PharmD, [place] PGY2 Cardiology Pharmacy Resident

## 2022-03-30 NOTE — Telephone Encounter (Signed)
2 week f/u with VS or any APP, f/u effusion

## 2022-03-30 NOTE — Progress Notes (Addendum)
Daily Progress Note Intern Pager: [phone]  Patient name: [name] Medical record number: [id number] Date of birth: October 30, 1950 Age: 71 y.o. Gender: female  Primary Care Provider: [name], MD Consultants: Pulmonology, cardiology Code Status: Full  Pt Overview and Major Events to Date:  6/11: Admitted 6/12: Thoracentesis performed 6/15: Chest tube placement  Assessment and Plan: [name] is a 71 year old female who presented with hypoxia and found to have moderate pleural effusion.  Pertinent PMH/PSH includes 2 DM, CHF, COPD, liver cirrhosis, small cell lung cancer with brain metastasis, GERD.   * Acute respiratory failure with hypoxia ([place]) Chest tube in place for recent Pneumothorax yesterday.Stable CXR this morning with no signs of pneumothorax. -Pulmonology follow, appreciate recs -Cardiology following, appreciate rec -Holding  IV Lasix for now -O2 sat goals 88-92%  Pneumonia Stable and afebrile. Will continue her abx. -Pulmonology following, appreciate recs -Continue IV cefepime. (day 4/5)  Acute decompensation of Heart Failure mid-range Ejection Fraction Euvolemic. CXR today without Pleura effusion. Pt at baseline 3L [place] and reports improved dyspnea. With improved BP and Cr patient's Jardiance and Losartan was restated  -Cardiology following, appreciate recs -Holding IV lasix for now - Strict I's and O's - Daily weights - Continuous cardiac monitoring -Started Jardiance 71m daily per Cards -Restarted losartan 12.5 mg per cards -hold  BB per Cards to restart outpatient  Insulin dependent type 2 diabetes mellitus ([place] She has blood glucose this morning was 101.  Home medication include metformin 500 BID and Jardiance 25 mg daily. -sSSI -Monitor glucose with BMP -Hold home meds  COPD with acute exacerbation (HCC) Stable on baseline home 3L [place] with reassuring 02 sats. Home meds include Trelegy Ellipta, albuterol  -Continue Trellergy -O2 sats  88-92%   FEN/GI: Carb modified diet PPx: Lovenox Dispo:Home pending clinical improvement .   Subjective:  [name] she is doing well and feels like she is back to her baseline.  No complaint of dyspnea.  Objective: Temp:  [97.6 F (36.4 C)-98.2 F (36.8 C)] 98.1 F (36.7 C) (06/16 1022) Pulse Rate:  [71-80] 75 (06/16 1022) Resp:  [16-20] 18 (06/16 1022) BP: (102-128)/(65-79) 118/69 (06/16 1022) SpO2:  [88 %-100 %] 96 % (06/16 1022) Weight:  [50.6 kg] 50.6 kg (06/16 0500) Physical Exam: General: Alert, well appearing, NAD HEENT: Atraumatic, MMM, No sclera icterus CV: RRR, no murmurs, normal S1/S2 Pulm: Coarse breath sounds bilaterally with good air movement, good WOB on 3 L [place] Abd: Soft, no distension, no tenderness Skin: dry, warm Ext: No BLE edema, +2 Pedal and radial pulse.   Laboratory: Most recent CBC Lab Results  Component Value Date   WBC 4.4 03/27/2022   HGB 9.4 (L) 03/27/2022   HCT 31.6 (L) 03/27/2022   MCV 95.5 03/27/2022   PLT 162 03/27/2022   Most recent BMP    Latest Ref Rng & Units 03/30/2022    2:58 AM  BMP  Glucose 70 - 99 mg/dL 101   BUN 8 - 23 mg/dL 22   Creatinine 0.44 - 1.00 mg/dL 1.19   Sodium 135 - 145 mmol/L 139   Potassium 3.5 - 5.1 mmol/L 3.9   Chloride 98 - 111 mmol/L 102   CO2 22 - 32 mmol/L 29   Calcium 8.9 - 10.3 mg/dL 8.7     Imaging/Diagnostic Tests: DG CHEST PORT 1 VIEW  Result Date: 03/30/2022 CLINICAL DATA:  Chest tube in place. EXAM: PORTABLE CHEST 1 VIEW COMPARISON:  AP chest 03/30/2022 at  5:23 a.m.; AP chest 03/29/2022; AP chest 03/03/2021; CT chest 03/26/2022 FINDINGS: 03/30/2022 at 11:42 a.m. Redemonstration of right-sided chest tube with pigtail overlying the right lung apex. This appears unchanged from 03/29/2022. Slight difference in shape likely due to more lordotic positioning on the current radiograph and 03/29/2022. Cardiac silhouette is again mildly enlarged. Mediastinal contours are within normal limits  with mild calcification within aortic arch. Moderate bilateral chronic interstitial thickening. Increased bibasilar heterogeneous airspace opacities compared to 03/03/2021, unchanged from most recent prior radiographs. Known right perihilar masslike changes on recent CT. No definite pneumothorax is seen. The subtle lucency overlying the right hemidiaphragm on most recent radiograph is not seen on the current radiograph. At least small bilateral pleural effusions, appearing moderate on the right and mild-to-moderate on the left on 03/26/2022 CT. Moderate multilevel degenerative disc changes of the thoracic spine. IMPRESSION: 1. No pneumothorax is seen. The subtle lucency overlying the right hemidiaphragm on most recent radiograph is not seen on the current radiograph. Unchanged right-sided chest tube. 2. Unchanged right perihilar masslike density. 3. Unchanged bibasilar heterogeneous opacities and small bilateral pleural effusions. Electronically Signed   By: Yvonne Kendall M.D.   On: 03/30/2022 11:59   DG Chest Port 1 View  Result Date: 03/30/2022 CLINICAL DATA:  A 71 year old female presents for evaluation of hypoxia. EXAM: PORTABLE CHEST 1 VIEW COMPARISON:  None March 29, 2022. FINDINGS: Trachea midline. Cardiomediastinal contours and hilar structures are stable with known RIGHT perihilar masslike changes and increased interstitial markings. A RIGHT-sided chest tube remains in place, tip over the RIGHT lung apex. Subtle lucency at the RIGHT lower chest may reflect small anterior or basilar pneumothorax though on this upright view no definitive signs of apical component. Interstitial changes are similar to prior imaging. On limited assessment there is no acute skeletal process. IMPRESSION: 1. Stable RIGHT perihilar masslike changes and increased interstitial markings. 2. Subtle lucency at the RIGHT lower chest may reflect small anterior or basilar pneumothorax though on this upright view no definitive signs of  apical component. Chest tube in stable position. 3. Stable interstitial changes in the lungs. Electronically Signed   By: Zetta Bills M.D.   On: 03/30/2022 08:28    Alen Bleacher, MD 03/30/2022, 12:18 PM  PGY-1, [place] Intern pager: [phone], text pages welcome Secure chat group [place]

## 2022-03-30 NOTE — Progress Notes (Signed)
Clamped chest tube

## 2022-03-30 NOTE — Progress Notes (Signed)
Mobility Specialist Progress Note   03/30/22 1600  Mobility  Activity Ambulated with assistance in hallway;Ambulated with assistance to bathroom  Level of Assistance Contact guard assist, steadying assist  Assistive Device Four wheel walker  Distance Ambulated (ft) 130 ft  Activity Response Tolerated well  $Mobility charge 1 Mobility   Pre Mobility: 85 HR, 94% SpO2 on 3LO2 During Mobility: 93 HR, 91% SpO2 on 3LO2 Post Mobility: 91 HR, 95%  SpO2 on 3LO2  Received in bed having no complaints and agreeable. Ambulated in halls w/o fault or complaint at minG, SpO2 levels stayed >91%. Once back in the room pt requesting to use the BR, successful void w/o incident. Pt left in bed w/ call bell in reach and needs met.  Holland Falling Mobility Specialist Phone Number [phone]'

## 2022-03-31 ENCOUNTER — Inpatient Hospital Stay (HOSPITAL_COMMUNITY): Payer: Medicare HMO

## 2022-03-31 DIAGNOSIS — D509 Iron deficiency anemia, unspecified: Secondary | ICD-10-CM

## 2022-03-31 DIAGNOSIS — N1831 Chronic kidney disease, stage 3a: Secondary | ICD-10-CM

## 2022-03-31 LAB — CULTURE, BLOOD (ROUTINE X 2)
Culture: NO GROWTH
Culture: NO GROWTH
Special Requests: ADEQUATE
Special Requests: ADEQUATE

## 2022-03-31 LAB — BASIC METABOLIC PANEL
Anion gap: 6 (ref 5–15)
BUN: 19 mg/dL (ref 8–23)
CO2: 26 mmol/L (ref 22–32)
Calcium: 8.4 mg/dL — ABNORMAL LOW (ref 8.9–10.3)
Chloride: 103 mmol/L (ref 98–111)
Creatinine, Ser: 1.03 mg/dL — ABNORMAL HIGH (ref 0.44–1.00)
GFR, Estimated: 58 mL/min — ABNORMAL LOW (ref >60)
Glucose, Bld: 87 mg/dL (ref 70–99)
Potassium: 3.6 mmol/L (ref 3.5–5.1)
Sodium: 135 mmol/L (ref 135–145)

## 2022-03-31 LAB — CBC WITH DIFFERENTIAL/PLATELET
Abs Immature Granulocytes: 0.01 10*3/uL (ref 0.00–0.07)
Basophils Absolute: 0 10*3/uL (ref 0.0–0.1)
Basophils Relative: 0 %
Eosinophils Absolute: 0.2 10*3/uL (ref 0.0–0.5)
Eosinophils Relative: 4 %
HCT: 31.3 % — ABNORMAL LOW (ref 36.0–46.0)
Hemoglobin: 9.3 g/dL — ABNORMAL LOW (ref 12.0–15.0)
Immature Granulocytes: 0 %
Lymphocytes Relative: 14 %
Lymphs Abs: 0.6 10*3/uL — ABNORMAL LOW (ref 0.7–4.0)
MCH: 28.3 pg (ref 26.0–34.0)
MCHC: 29.7 g/dL — ABNORMAL LOW (ref 30.0–36.0)
MCV: 95.1 fL (ref 80.0–100.0)
Monocytes Absolute: 0.5 10*3/uL (ref 0.1–1.0)
Monocytes Relative: 12 %
Neutro Abs: 3.1 10*3/uL (ref 1.7–7.7)
Neutrophils Relative %: 70 %
Platelets: 120 10*3/uL — ABNORMAL LOW (ref 150–400)
RBC: 3.29 MIL/uL — ABNORMAL LOW (ref 3.87–5.11)
RDW: 15.9 % — ABNORMAL HIGH (ref 11.5–15.5)
WBC: 4.5 10*3/uL (ref 4.0–10.5)
nRBC: 0 % (ref 0.0–0.2)

## 2022-03-31 LAB — MAGNESIUM: Magnesium: 2.2 mg/dL (ref 1.7–2.4)

## 2022-03-31 MED ORDER — EMPAGLIFLOZIN 10 MG PO TABS
10.0000 mg | ORAL_TABLET | Freq: Every day | ORAL | Status: DC
  Administered 2022-04-01 – 2022-04-02 (×2): 10 mg via ORAL
  Filled 2022-03-31 (×3): qty 1

## 2022-03-31 NOTE — Progress Notes (Signed)
Mobility Specialist Progress Note:   03/31/22 1530  Mobility  Activity Ambulated with assistance to bathroom  Level of Assistance Contact guard assist, steadying assist  Assistive Device None  Distance Ambulated (ft) 20 ft  Activity Response Tolerated well  $Mobility charge 1 Mobility   Pt requesting to go to BR for BM. BM unsuccessful. Required no physical assistance, only min guard for safety. Pt back in bed with Xray present. Bed alarm on.  Nelta Numbers Acute Rehab Secure Chat or Office Phone: [phone]

## 2022-03-31 NOTE — Progress Notes (Addendum)
Daily Progress Note Intern Pager: [phone]  Patient name: [name] Medical record number: [id number] Date of birth: May 10, 1951 Age: 71 y.o. Gender: female  Primary Care Provider: [name], MD Consultants: Pulmonology, cardiology Code Status: Full  Pt Overview and Major Events to Date:  6/11: Admitted 6/12: Thoracentesis performed 6/15: Chest tube placed 6/16: Chest tube removed  Assessment and Plan: [name] is a 71 year old female who presented with hypoxia and found to have moderate pleural effusion.  She is s/p thoracentesis and chest tube placement.   Pertinent PMH/PSH includes DM, CHF, COPD, liver cirrhosis, small cell lung cancer with brain metastasis, GERD.   * Acute respiratory failure with hypoxia (HCC) Chest tube taken out yesterday and morning CXR slight worsening interval R pneumotharax, will monitor closely and follow pulmonology recommendations. -Pulmonology follow, appreciate recs -Cardiology following, appreciate rec -Continue daily CXR -Holding  IV Lasix for now -O2 sat goals 88-92%  Pneumonia Stable and afebrile. Will complete her 5 days of abx today -Pulmonology following, appreciate recs -Continue IV cefepime. (day 5/5)  Acute decompensation of Heart Failure mid-range Ejection Fraction Euvolemic and stable on baseline 3L [place]. Wt today is 54.9kg -Cardiology consulted, appreciate recs - Lasix PRN  - Strict I's and O's - Daily weights - Continuous cardiac monitoring - Decreased Jardiance to 56m daily per Cards -Continue losartan 12.5 mg per cards -hold  BB per Cards to restart outpatient  Insulin dependent type 2 diabetes mellitus ([place] She has blood glucose this morning was 101.  Home medication include metformin 500 BID and Jardiance 25 mg daily. -sSSI -Monitor glucose with BMP -Restarted home Jardiance, holding Metformin meds  COPD with acute exacerbation ([place] Stable on baseline home 3L [place] with reassuring 02 sats. Home  meds include Trelegy Ellipta, albuterol  -Continue Trellergy -O2 sats 88-92%  Benign hypertension with CKD (chronic kidney disease) stage III (HCC) Mild low BP this morning at 113/52. Asymptomatic. Will watch closely.  Acute on CKD (chronic kidney disease), stage IIIa Creatinine is improved to 1.03. Baseline ~0.9 -Avoid nephrotoxic agent -Daily BMP  IDA (iron deficiency anemia) Hemoglobin is stable from 2 days ago at 9.3. She will get iron infusion today.  Home medication includes ferrous sulfate 325 daily. -continue home med -transfusion threshold 8  FEN/GI: Carb modified diet PPx: Lovenox Dispo:Home pending clinical improvement . Barriers include pneumothorax.   Subjective:  [name] laying in bed comfortably.  She says she is doing well and has no complaint.  Objective: Temp:  [97.9 F (36.6 C)-98.3 F (36.8 C)] 97.9 F (36.6 C) (06/17 0806) Pulse Rate:  [69-78] 74 (06/17 0806) Resp:  [16-18] 18 (06/17 0806) BP: (97-123)/(52-80) 113/52 (06/17 0806) SpO2:  [99 %-100 %] 99 % (06/17 0820) Weight:  [54.9 kg-55.2 kg] 54.9 kg (06/17 0820) Physical Exam: General: Laying comfortably in bed, NAD CV: RRR, no murmurs, normal S1/S2 Pulm: CTAB, good WOB on 3L [place] Abd: Soft, no distension, no tenderness Skin: dry, warm Ext: No BLE edema, +2 Pedal and radial pulse.   Laboratory: Most recent CBC Lab Results  Component Value Date   WBC 4.5 03/31/2022   HGB 9.3 (L) 03/31/2022   HCT 31.3 (L) 03/31/2022   MCV 95.1 03/31/2022   PLT 120 (L) 03/31/2022   Most recent BMP    Latest Ref Rng & Units 03/31/2022    1:24 AM  BMP  Glucose 70 - 99 mg/dL 87   BUN 8 - 23 mg/dL 19   Creatinine 0.44 -  1.00 mg/dL 1.03   Sodium 135 - 145 mmol/L 135   Potassium 3.5 - 5.1 mmol/L 3.6   Chloride 98 - 111 mmol/L 103   CO2 22 - 32 mmol/L 26   Calcium 8.9 - 10.3 mg/dL 8.4     Imaging/Diagnostic Tests: DG CHEST PORT 1 VIEW  Result Date: 03/31/2022 CLINICAL DATA:  Chest tube in place.  EXAM: PORTABLE CHEST 1 VIEW COMPARISON:  03/30/2022, 03/29/2022 and 03/28/2022 FINDINGS: Interval removal of patient's right apical chest tube. Minimal interval worsening of small right-sided pneumothorax compared to 03/30/2022 although improved compared to 03/28/2022. Stable interstitial changes bilaterally. Suggestion of small bilateral pleural effusions slightly worse on the right and unchanged on the left. Stable right perihilar opacification. Mild stable cardiomegaly. Two metallic densities which are possible surgical clips overlie the medial right lung base, medial upper abdomen. Remainder of the exam is unchanged. IMPRESSION: 1. Interval removal right apical chest tube with slight interval worsening of small right-sided pneumothorax compared to 03/30/2022, although improved compared to 03/28/2022. 2. Stable bilateral interstitial changes and right perihilar opacification. Small bilateral pleural effusions slightly worse on the right and unchanged on the left. Electronically Signed   By: Marin Olp M.D.   On: 03/31/2022 07:55    Alen Bleacher, MD 03/31/2022, 12:09 PM  PGY-1, [place] Intern pager: [phone], text pages welcome Secure chat group [place]

## 2022-03-31 NOTE — Progress Notes (Signed)
Mobility Specialist Progress Note:   03/31/22 1020  Mobility  Activity Ambulated with assistance in hallway  Level of Assistance Contact guard assist, steadying assist  Assistive Device Four wheel walker  Distance Ambulated (ft) 130 ft  Activity Response Tolerated well  $Mobility charge 1 Mobility   Pt agreeable to mobility session. Required no physical assist, min guard for safety. Pt asx throughout ambulation on 3LO2. Back in chair with all needs met, chair alarm on.  Nelta Numbers Acute Rehab Secure Chat or Office Phone: [phone]

## 2022-03-31 NOTE — Progress Notes (Signed)
NAME:  [name], MRN:  [id number], DOB:  [birth year], LOS: 5 ADMISSION DATE:  03/26/2022, CONSULTATION DATE:  03/31/2022  REFERRING MD:  Nori Riis, MD, CHIEF COMPLAINT: Shortness of breath  History of Present Illness:  71 year old with COPD on home oxygen 3 L and history of small cell lung cancer admitted with shortness of breath for a few days and right-sided pleuritic chest pain around the right breast for 2 days. She was seen 5/26 and 6/1 in the office and treated with prednisone taper and antibiotics She was hospitalized 6/1 to 6/4 for community-acquired pneumonia  Admission chest x-ray showed right middle lower lobe consolidation, CT chest showed consolidation in the right middle lobe and inferior right upper lobe with partially loculated right-sided pleural effusion. Hence PCCM consulted.  Oxygen saturation on admission was 92% on 3 L.  She was started on vancomycin and cefepime BNP was 996 Rash was noted on the dorsum of her right foot  Pertinent  Medical History  HFrEF -EF 30 to 35%, LBBB COPD on home oxygen, 3 L, follows with Dr. [name] at [facility]  ILD, probable UIP Small cell lung cancer diagnosed 2021, status post 4 cycles of chemo and whole brain radiation 09/2020 for brain mets Cirrhosis of liver, on imaging CKD stage II, baseline creatinine was 0.8 Iron deficiency anemia   05/12/2020 PFTs: FEV1 2.2 (87%), FEV1% 77, DLCO 58%.  Unable to do lung volume testing  CT chest with contrast 10/2021 -decrease in size of right middle lobe lesion from 2.7 x 2.0 cm to 1.8 x 1.5 cm  Significant Hospital Events: Including procedures, antibiotic start and stop dates in addition to other pertinent events   6/13 Right thoracentesis -1400 cc of fluid removed , ex vacuo pneumothorax 6/13 pleural fluid results >> transudative, 79% lymphocytes 6/14 pneumothorax improved 03/29/2022 right chest tube placed 03/30/2022 right chest tube has been clamped->removed 6/17 pulm called back  for recurrent ptx   Interim History / Subjective:   No distress Objective   Blood pressure (Abnormal) 113/52, pulse 74, temperature 97.9 F (36.6 C), temperature source Oral, resp. rate 18, weight 54.9 kg, SpO2 99 %.        Intake/Output Summary (Last 24 hours) at 03/31/2022 1137 Last data filed at 03/31/2022 1000 Gross per 24 hour  Intake 390 ml  Output no documentation  Net 390 ml   Filed Weights   03/30/22 0500 03/31/22 0446 03/31/22 0820  Weight: 50.6 kg 55.2 kg 54.9 kg    Examination: General resting in bed no distress HENT NCAT no JVD Pulm clear. Dec bases Card rrr Abd soft  Ext warm  Neuro intact  CXR worseing right PTX chronic scarring   Resolved Hospital Problem list   CAP   Assessment & Plan:    Right pleural effusion (transudate) Pneumothorax Right  ILD-no significant airway obstruction on PFTs and hypoxia is more likely on the basis of pulmonary fibrosis.  Not considered  a candidate for antifibrotic's due to liver disease Chronic respiratory failure  Heart failure  H/o lung cancer   Current Pulm problem  Recurrent Right PTX.  Felt initially ex-vacuo but ended up having CT placed and eventually chest re-expansion. Chest tube removed 6/17 and now has small interval worsening of right PTX. Currently no symptoms so this is reassuring Plan Repeat CXR at 3 pm and in am. Hope we can avoid having to place new tube I would not discharge her   Best Practice (right click and "Reselect all SmartList Selections"  daily)   Per primary service Code Status:  full code

## 2022-03-31 NOTE — Progress Notes (Signed)
CXR reviewed. Still w/ right PTX. Looks about the same to me. No new or worsening shortness of breath. Still has some right scapular discomfort but this was present when I saw her earlier this am.  Plan Cont to monitor I have instructed her to let us know if she gets more short of breath or of pain worsens in which case we will get CXR earlier and possibly plan to replace chest tube.   Erick Colace ACNP-BC [place] Pager # [phone] OR # [phone] if no answer\

## 2022-03-31 NOTE — Progress Notes (Signed)
  S:Went to bedside to check on patient, she was asleep so I did not wake her.   O: BP 123/80 (BP Location: Left Arm)   Pulse 78   Temp 98.1 F (36.7 C) (Oral)   Resp 18   Wt 50.6 kg   SpO2 100%   BMI 18.01 kg/m   General: Patient resting comfortably, in no acute distress. Resp: normal work of breathing noted   A/P: Vitals stable. Orders reviewed and appropriate. Continue plan per day team.   Donney Dice, DO 03/31/2022, 12:12 AM PGY-2, [place] Medicine Service pager [phone]

## 2022-04-01 ENCOUNTER — Inpatient Hospital Stay (HOSPITAL_COMMUNITY): Payer: Medicare HMO

## 2022-04-01 LAB — BASIC METABOLIC PANEL
Anion gap: 8 (ref 5–15)
BUN: 20 mg/dL (ref 8–23)
CO2: 27 mmol/L (ref 22–32)
Calcium: 8.6 mg/dL — ABNORMAL LOW (ref 8.9–10.3)
Chloride: 104 mmol/L (ref 98–111)
Creatinine, Ser: 0.99 mg/dL (ref 0.44–1.00)
GFR, Estimated: >60 mL/min (ref >60)
Glucose, Bld: 178 mg/dL — ABNORMAL HIGH (ref 70–99)
Potassium: 3.8 mmol/L (ref 3.5–5.1)
Sodium: 139 mmol/L (ref 135–145)

## 2022-04-01 LAB — CBC WITH DIFFERENTIAL/PLATELET
Abs Immature Granulocytes: 0.01 10*3/uL (ref 0.00–0.07)
Basophils Absolute: 0 10*3/uL (ref 0.0–0.1)
Basophils Relative: 0 %
Eosinophils Absolute: 0.1 10*3/uL (ref 0.0–0.5)
Eosinophils Relative: 3 %
HCT: 28.3 % — ABNORMAL LOW (ref 36.0–46.0)
Hemoglobin: 8.6 g/dL — ABNORMAL LOW (ref 12.0–15.0)
Immature Granulocytes: 0 %
Lymphocytes Relative: 16 %
Lymphs Abs: 0.6 10*3/uL — ABNORMAL LOW (ref 0.7–4.0)
MCH: 28.8 pg (ref 26.0–34.0)
MCHC: 30.4 g/dL (ref 30.0–36.0)
MCV: 94.6 fL (ref 80.0–100.0)
Monocytes Absolute: 0.5 10*3/uL (ref 0.1–1.0)
Monocytes Relative: 12 %
Neutro Abs: 2.7 10*3/uL (ref 1.7–7.7)
Neutrophils Relative %: 69 %
Platelets: 115 10*3/uL — ABNORMAL LOW (ref 150–400)
RBC: 2.99 MIL/uL — ABNORMAL LOW (ref 3.87–5.11)
RDW: 15.8 % — ABNORMAL HIGH (ref 11.5–15.5)
WBC: 3.9 10*3/uL — ABNORMAL LOW (ref 4.0–10.5)
nRBC: 0 % (ref 0.0–0.2)

## 2022-04-01 LAB — MAGNESIUM: Magnesium: 1.9 mg/dL (ref 1.7–2.4)

## 2022-04-01 NOTE — Progress Notes (Addendum)
Daily Progress Note Intern Pager: [phone]  Patient name: [name] Medical record number: [id number] Date of birth: 1950-11-19 Age: 71 y.o. Gender: female  Primary Care Provider: [name], MD Consultants: Fatima Sanger, cardiology Code Status: DNR  Pt Overview and Major Events to Date:  6/11: Admitted 6/12: Thoracentesis c/b PTX 6/15: Chest tube placed 6/16: Chest tube removed  Assessment and Plan:  [name] is a 71 year old female admitted with acute hypoxic respiratory failure secondary to large right pleural effusion. 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  * Acute on chronic respiratory failure with hypoxemia (HCC) Secondary to right-sided pleural effusion. Significantly improved s/p thoracentesis with 1400 cc pleural fluid removed. Stable on her home 3L -Pulmonology following, appreciate recommendations -Continue daily CXR for monitoring of pneumothorax (see below) -Holding IV Lasix for now (see HFrEF below) -O2 sat goals 88-92%  Pneumothorax Stable on CXR this morning.  No hypoxia on her baseline 3L and patient is asymptomatic. -Pulm following, appreciate recommendations -Serial CXRs  Chronic heart failure with preserved ejection fraction (HFpEF) ([place]) Unfortunately strict UOP not recorded, but weight is stable.  Appears euvolemic on exam. -Cardiology following, appreciate recommendations -Hold home Lasix for now -Strict I's and O's, daily weights -Jardiance 79m daily -Continue losartan 12.5 mg -hold BB per cards, to restart outpatient  Non-insulin dependent type 2 diabetes mellitus ([place] Very well controlled, last A1c 5.8% -Continue home Jardiance -Holding home Metformin -Glucose monitoring on daily BMP only  Benign hypertension with CKD (chronic kidney disease) stage III [place] Normotensive since admission. -Continue  Losartan 12.59mdaily  HCAP (healthcare-associated pneumonia) S/p 5 days of Cefepime (6/12-6/17)  IDA (iron deficiency anemia) Hgb 8.6 this am. s/p IV iron (ferric gluconate 25074maily x3 days 6/14-6/17) -continue home ferrous sulfate 325m38mily -daily CBC -transfusion threshold 8   FEN/GI: Heart healthy/carb modified diet PPx: Lovenox Dispo:Home  in 1-2 days . Barriers include ongoing monitoring of pneumothorax.   Subjective:  No acute events overnight. Patient denies complaints this morning. Feels her breathing is "good". No shortness of breath at rest or with ambulating.  Objective: Temp:  [97.6 F (36.4 C)-98.5 F (36.9 C)] 98.5 F (36.9 C) (06/18 0915) Pulse Rate:  [73-83] 73 (06/18 0915) Resp:  [17-19] 18 (06/18 0915) BP: (102-125)/(56-70) 102/57 (06/18 0915) SpO2:  [98 %-100 %] 98 % (06/18 0915) Weight:  [54.2 kg] 54.2 kg (06/18 0500) Physical Exam: General: NAD Cardiovascular: RRR, normal S1/S2 Respiratory: normal effort on 3L, slightly diminished BS right upper lung field. Bibasilar crackles Abdomen: soft, nontender Extremities: no peripheral edema  Laboratory: Most recent CBC Lab Results  Component Value Date   WBC 3.9 (L) 04/01/2022   HGB 8.6 (L) 04/01/2022   HCT 28.3 (L) 04/01/2022   MCV 94.6 04/01/2022   PLT 115 (L) 04/01/2022   Most recent BMP    Latest Ref Rng & Units 04/01/2022   12:53 AM  BMP  Glucose 70 - 99 mg/dL 178   BUN 8 - 23 mg/dL 20   Creatinine 0.44 - 1.00 mg/dL 0.99   Sodium 135 - 145 mmol/L 139   Potassium 3.5 - 5.1 mmol/L 3.8   Chloride 98 - 111 mmol/L 104   CO2 22 - 32 mmol/L 27   Calcium 8.9 - 10.3 mg/dL 8.6     Other pertinent labs: None  Imaging/Diagnostic Tests: DG Chest Port 1 View Result  Date: 04/01/2022 CLINICAL DATA:  71 year old female with history of pneumothorax. EXAM: PORTABLE CHEST 1 VIEW COMPARISON:  Chest x-ray 03/31/2022. FINDINGS: Persistent small right apical pneumothorax appears unchanged. There is also  evidence of a small volume of right-sided pleural fluid. No left pneumothorax. Widespread areas of interstitial prominence and peribronchial cuffing are noted throughout the lungs bilaterally, likely related in part to chronic interstitial lung disease given similar to prior studies, however, there is also cephalization of the pulmonary vasculature and some patchy ill-defined opacities such that superimposed pulmonary edema is not excluded. Notably, the aeration is asymmetrically worse in the right mid to lower lung as well, such that superimposed infection is not excluded. Mild cardiomegaly. Mass-like fullness in the right perihilar region. Upper mediastinal contours are within normal limits. Atherosclerotic calcifications are noted in the thoracic aorta. IMPRESSION: 1. Right-sided hydropneumothorax with stable pneumothorax component. 2. Small left pleural effusion. 3. The appearance of the lungs is unusual, complicated by pre-existing interstitial lung disease. Findings may suggest pulmonary edema, but are asymmetric including potential areas of infectious consolidation throughout the right mid to lower lung. Additionally, there is mass-like fullness in the perihilar aspect of the right hemithorax. The possibility of centrally obstructing neoplasm should be considered. Further evaluation with contrast enhanced chest CT is recommended at this time if clinically appropriate. 4. Aortic atherosclerosis. 1. Electronically Signed   By: Vinnie Langton M.D.   On: 04/01/2022 08:28    Alcus Dad, MD 04/01/2022, 11:33 AM  PGY-2, [place] Intern pager: [phone], text pages welcome Secure chat group [place]

## 2022-04-01 NOTE — Progress Notes (Signed)
Mobility Specialist Progress Note:   04/01/22 1300  Mobility  Activity Ambulated with assistance in hallway  Level of Assistance Standby assist, set-up cues, supervision of patient - no hands on  Assistive Device Four wheel walker  Distance Ambulated (ft) 130 ft  Activity Response Tolerated well  $Mobility charge 1 Mobility   Pt eager for mobility session. SpO2 Wfl on 3LO2. Pt not requiring any physical assistance throughout. Left sitting EOB with all needs met.   Nelta Numbers Acute Rehab Secure Chat or Office Phone: [phone]

## 2022-04-01 NOTE — Progress Notes (Signed)
Mobility Specialist Progress Note:   04/01/22 1045  Mobility  Activity  (repositioned in bed)  Level of Assistance Maximum assist, patient does 25-49%  Assistive Device None  Activity Response Tolerated well  $Mobility charge 1 Mobility   Session limited by lethargy/fatigue. Difficult to arouse upon arrival, able to assist with repositioning however required significant assistance. Pt left in bed, will f/u later today for hopeful OOB mobility.   Nelta Numbers Acute Rehab Secure Chat or Office Phone: [phone]

## 2022-04-01 NOTE — Assessment & Plan Note (Addendum)
Stable on CXR this morning.  No hypoxia on her baseline 3L and patient is asymptomatic. Will f/u CXR today. -Pulm following, appreciate recommendations -Serial CXRs

## 2022-04-01 NOTE — Progress Notes (Signed)
NAME:  [name], MRN:  [id number], DOB:  Mar 26, 1951, LOS: 6 ADMISSION DATE:  03/26/2022, CONSULTATION DATE:  04/01/2022  REFERRING MD:  Nori Riis, MD, CHIEF COMPLAINT: Shortness of breath  History of Present Illness:  71 year old with COPD on home oxygen 3 L and history of small cell lung cancer admitted with shortness of breath for a few days and right-sided pleuritic chest pain around the right breast for 2 days. She was seen 5/26 and 6/1 in the office and treated with prednisone taper and antibiotics She was hospitalized 6/1 to 6/4 for community-acquired pneumonia  Admission chest x-ray showed right middle lower lobe consolidation, CT chest showed consolidation in the right middle lobe and inferior right upper lobe with partially loculated right-sided pleural effusion. Hence PCCM consulted.  Oxygen saturation on admission was 92% on 3 L.  She was started on vancomycin and cefepime BNP was 996 Rash was noted on the dorsum of her right foot  Pertinent  Medical History  HFrEF -EF 30 to 35%, LBBB COPD on home oxygen, 3 L, follows with Dr. [name]  ILD, probable UIP Small cell lung cancer diagnosed 2021, status post 4 cycles of chemo and whole brain radiation 09/2020 for brain mets Cirrhosis of liver, on imaging CKD stage II, baseline creatinine was 0.8 Iron deficiency anemia   05/12/2020 PFTs: FEV1 2.2 (87%), FEV1% 77, DLCO 58%.  Unable to do lung volume testing  CT chest with contrast 10/2021 -decrease in size of right middle lobe lesion from 2.7 x 2.0 cm to 1.8 x 1.5 cm  Significant Hospital Events: Including procedures, antibiotic start and stop dates in addition to other pertinent events   6/13 Right thoracentesis -1400 cc of fluid removed , ex vacuo pneumothorax 6/13 pleural fluid results >> transudative, 79% lymphocytes 6/14 pneumothorax improved 03/29/2022 right chest tube placed 03/30/2022 right chest tube has been clamped->removed 6/17 pulm called back  for recurrent ptx   Interim History / Subjective:   No distress pain better Objective   Blood pressure (Abnormal) 109/59, pulse 74, temperature 97.6 F (36.4 C), temperature source Oral, resp. rate 18, weight 54.2 kg, SpO2 99 %.        Intake/Output Summary (Last 24 hours) at 04/01/2022 0706 Last data filed at 03/31/2022 1818 Gross per 24 hour  Intake 640 ml  Output no documentation  Net 640 ml   Filed Weights   03/31/22 0446 03/31/22 0820 04/01/22 0500  Weight: 55.2 kg 54.9 kg 54.2 kg    Examination:  General frail 71 year old female resting in bed no acute distress HEENT normocephalic atraumatic no jugular venous distention Pulmonary: Some basilar rales, no accessory use CXR looks a little better  Cardiac regular rate and rhythm Abdomen soft nontender Extremities warm dry  Resolved Hospital Problem list   CAP   Assessment & Plan:    Right pleural effusion (transudate) Pneumothorax Right  ILD-no significant airway obstruction on PFTs and hypoxia is more likely on the basis of pulmonary fibrosis.  Not considered  a candidate for antifibrotic's due to liver disease Chronic respiratory failure  Heart failure  H/o lung cancer   Current Pulm problem  Recurrent Right PTX.  -stable overnight. Actually looks a little better.  Plan Would keep her here one more day; f/u am cxr if stable could be d/c'd w/ follow up at our clinic 1 week  Best Practice (right click and "Reselect all SmartList Selections" daily)   Per primary service Code Status:  full code  Collier Salina  Starleen Arms ACNP-BC [place] Pager # [phone] OR # [phone] if no answer

## 2022-04-02 ENCOUNTER — Other Ambulatory Visit (HOSPITAL_COMMUNITY): Payer: Self-pay

## 2022-04-02 ENCOUNTER — Encounter: Payer: Self-pay | Admitting: Internal Medicine

## 2022-04-02 ENCOUNTER — Ambulatory Visit: Payer: Medicare HMO | Admitting: Surgical

## 2022-04-02 DIAGNOSIS — J9621 Acute and chronic respiratory failure with hypoxia: Secondary | ICD-10-CM

## 2022-04-02 LAB — CBC WITH DIFFERENTIAL/PLATELET
Abs Immature Granulocytes: 0.02 10*3/uL (ref 0.00–0.07)
Basophils Absolute: 0 10*3/uL (ref 0.0–0.1)
Basophils Relative: 1 %
Eosinophils Absolute: 0.2 10*3/uL (ref 0.0–0.5)
Eosinophils Relative: 5 %
HCT: 32.3 % — ABNORMAL LOW (ref 36.0–46.0)
Hemoglobin: 9.7 g/dL — ABNORMAL LOW (ref 12.0–15.0)
Immature Granulocytes: 1 %
Lymphocytes Relative: 22 %
Lymphs Abs: 0.8 10*3/uL (ref 0.7–4.0)
MCH: 28 pg (ref 26.0–34.0)
MCHC: 30 g/dL (ref 30.0–36.0)
MCV: 93.1 fL (ref 80.0–100.0)
Monocytes Absolute: 0.4 10*3/uL (ref 0.1–1.0)
Monocytes Relative: 12 %
Neutro Abs: 2.1 10*3/uL (ref 1.7–7.7)
Neutrophils Relative %: 59 %
Platelets: 122 10*3/uL — ABNORMAL LOW (ref 150–400)
RBC: 3.47 MIL/uL — ABNORMAL LOW (ref 3.87–5.11)
RDW: 16.1 % — ABNORMAL HIGH (ref 11.5–15.5)
WBC: 3.4 10*3/uL — ABNORMAL LOW (ref 4.0–10.5)
nRBC: 0 % (ref 0.0–0.2)

## 2022-04-02 LAB — BASIC METABOLIC PANEL
Anion gap: 6 (ref 5–15)
BUN: 15 mg/dL (ref 8–23)
CO2: 27 mmol/L (ref 22–32)
Calcium: 8.8 mg/dL — ABNORMAL LOW (ref 8.9–10.3)
Chloride: 105 mmol/L (ref 98–111)
Creatinine, Ser: 1.04 mg/dL — ABNORMAL HIGH (ref 0.44–1.00)
GFR, Estimated: 58 mL/min — ABNORMAL LOW (ref >60)
Glucose, Bld: 87 mg/dL (ref 70–99)
Potassium: 3.8 mmol/L (ref 3.5–5.1)
Sodium: 138 mmol/L (ref 135–145)

## 2022-04-02 LAB — MAGNESIUM: Magnesium: 2 mg/dL (ref 1.7–2.4)

## 2022-04-02 MED ORDER — EMPAGLIFLOZIN 10 MG PO TABS
10.0000 mg | ORAL_TABLET | Freq: Every day | ORAL | 0 refills | Status: AC
  Filled 2022-04-02: qty 30, 30d supply, fill #0

## 2022-04-02 MED ORDER — ASPIRIN 81 MG PO CHEW: 81.0000 mg | CHEWABLE_TABLET | Freq: Every day | ORAL | Status: DC

## 2022-04-02 MED ORDER — ACETAMINOPHEN 325 MG PO TABS: 650.0000 mg | ORAL_TABLET | ORAL | Status: AC | PRN

## 2022-04-02 NOTE — Evaluation (Signed)
Physical Therapy Evaluation and discharge Patient Details Name: [name] MRN: [id number] DOB: [birth year] Today's Date: 04/02/2022  History of Present Illness  Pt is a 71yo F admitted on 03/26/22 with worsening dyspnea and O2 sats in the 50s. Found to have R ptx and s/p R thoracentesis. Had a R chest tube placed on 6/15 and had it removed 6/16 secondary to R ptx. Pt was recently admitted to [facility] on 6/1 and treated for pneumonia. PMH: small cell lung cancer, CHF, CKD, COPD, DM, chronic respiratory failure, hypothyroidism, HLD, GERD  Clinical Impression  Pt admitted secondary to problem above with deficits below. Pt overall at a supervision to mod I level with mobility tasks. No LOB noted. Pt reports she feels close to her baseline mobility. Reports friends and neighbors can help as needed at d/c. No further skilled PT needs at this time. Will sign off. If needs change, please re-consult.        Recommendations for follow up therapy are one component of a multi-disciplinary discharge planning process, led by the attending physician.  Recommendations may be updated based on patient status, additional functional criteria and insurance authorization.  Follow Up Recommendations No PT follow up    Assistance Recommended at Discharge Intermittent Supervision/Assistance  Patient can return home with the following  Assist for transportation;Assistance with cooking/housework    Equipment Recommendations None recommended by PT  Recommendations for Other Services       Functional Status Assessment Patient has had a recent decline in their functional status and demonstrates the ability to make significant improvements in function in a reasonable and predictable amount of time.     Precautions / Restrictions Precautions Precautions: None Restrictions Weight Bearing Restrictions: No      Mobility  Bed Mobility Overal bed mobility: Modified Independent                   Transfers Overall transfer level: Modified independent Equipment used: Rollator (4 wheels)                    Ambulation/Gait Ambulation/Gait assistance: Supervision Gait Distance (Feet): 150 Feet Assistive device: Rollator (4 wheels) Gait Pattern/deviations: Step-through pattern, Decreased stride length Gait velocity: decreased     General Gait Details: Supervision for safety and line management. No LOB noted. Pt reports feeling close to baseline.  Stairs            Wheelchair Mobility    Modified Rankin (Stroke Patients Only)       Balance Overall balance assessment: Mild deficits observed, not formally tested                                           Pertinent Vitals/Pain Pain Assessment Pain Assessment: No/denies pain    Home Living Family/patient expects to be discharged to:: Private residence Living Arrangements: Alone Available Help at Discharge: Family;Available PRN/intermittently Type of Home: House Home Access: Ramped entrance       Home Layout: One level Home Equipment: Conservation officer, nature (2 wheels)      Prior Function Prior Level of Function : Independent/Modified Independent             Mobility Comments: uses rollator for longer distances       Hand Dominance        Extremity/Trunk Assessment   Upper Extremity Assessment Upper Extremity Assessment: Generalized weakness  Lower Extremity Assessment Lower Extremity Assessment: Generalized weakness (R hand deformity at baseline)    Cervical / Trunk Assessment Cervical / Trunk Assessment: Kyphotic  Communication   Communication: No difficulties  Cognition Arousal/Alertness: Awake/alert Behavior During Therapy: WFL for tasks assessed/performed Overall Cognitive Status: Within Functional Limits for tasks assessed                                          General Comments      Exercises     Assessment/Plan    PT Assessment  Patient does not need any further PT services  PT Problem List         PT Treatment Interventions      PT Goals (Current goals can be found in the Care Plan section)  Acute Rehab PT Goals Patient Stated Goal: to go home PT Goal Formulation: All assessment and education complete, DC therapy Time For Goal Achievement: 04/02/22 Potential to Achieve Goals: Good    Frequency       Co-evaluation               AM-PAC PT "6 Clicks" Mobility  Outcome Measure Help needed turning from your back to your side while in a flat bed without using bedrails?: None Help needed moving from lying on your back to sitting on the side of a flat bed without using bedrails?: None Help needed moving to and from a bed to a chair (including a wheelchair)?: None Help needed standing up from a chair using your arms (e.g., wheelchair or bedside chair)?: None Help needed to walk in hospital room?: None Help needed climbing 3-5 steps with a railing? : A Little 6 Click Score: 23    End of Session Equipment Utilized During Treatment: Oxygen Activity Tolerance: Patient tolerated treatment well Patient left: in bed;with call bell/phone within reach;with bed alarm set (sitting EOB) Nurse Communication: Mobility status PT Visit Diagnosis: Muscle weakness (generalized) (M62.81);Other abnormalities of gait and mobility (R26.89)    Time: 1610-9604 PT Time Calculation (min) (ACUTE ONLY): 12 min   Charges:   PT Evaluation $PT Eval Low Complexity: 1 Low          Reuel Derby, PT, DPT  Acute Rehabilitation Services  Office: [phone]   Rudean Hitt 04/02/2022, 12:57 PM

## 2022-04-02 NOTE — Discharge Summary (Signed)
[facility] Discharge Summary  Patient name: [name] Medical record number: [id number] Date of birth: [birth year] Age: 71 y.o. Gender: female Date of Admission: 03/26/2022  Date of Discharge: 04/02/2022 Admitting Physician: Holley Bouche, MD  Primary Care Provider: [name], MD Consultants: Cardiology, pulmonology  Indication for Hospitalization: Acute hypoxic respiratory failure  Discharge Diagnoses/Problem List:  Principal Problem:   Acute on chronic respiratory failure with hypoxemia [facility]) Active Problems:   Pneumothorax   Chronic heart failure with preserved ejection fraction (HFpEF) ([place])   Non-insulin dependent type 2 diabetes mellitus ([place])   Benign hypertension with CKD (chronic kidney disease) stage III (HCC)   IDA (iron deficiency anemia)   Hypoxia   HCAP (healthcare-associated pneumonia)    Disposition: Home  Discharge Condition: Stable  Discharge Exam:  Blood pressure 120/67, pulse 80, temperature (!) 97.4 F (36.3 C), temperature source Oral, resp. rate 17, weight 55.9 kg, SpO2 99 %.  General: awake, alert, NAD CV: RRR, no murmurs auscultated Pulm: CTAB, normal WOB on 3 LNC Abdomen: soft, nondistended, nontender, normoactive BS  Brief Hospital Course:  [name] is a 71 y.o. female who was admitted to the [facility] Teaching Service at [facility] for acute hypoxic respiratory failure. Hospital course is outlined below by system.    Acute respiratory failure with hypoxia Presented to the ED due to worsening dyspnea and initial work-up includes CTA and checks x-ray which showed large sized pleural effusion.  Pulmonology was consulted who performed thoracentesis (6/13) that drained about 1.4L of transudative fluid suggestive of cardia cause. Cardiology was consulted (see below for for CHF management).  Serial chest x-ray after thoracentesis showed worsening pneumothorax and chest tube was placed on 6/15 and removed  6/16. Subsequent CXR was stable and pulmonology plan out patient follow up scheduled for 6/26.  Discharge patient denies dyspnea and at baseline 3L [place] with good work of breathing and reassuring O2 sats.  Significant events 6/13 Right thoracentesis -1400 cc of fluid removed , ex vacuo pneumothorax 6/13 pleural fluid results > transudative 6/14 pneumothorax improved 03/29/2022 right chest tube placed 03/30/2022 right chest tube removed   HFrEF with elevated BNP BNP on admission was 996.4 and most recent echo 2 months ago showed EF of 30- 35%.  Given high history of HFrEF and transudative pleural effusion, her dyspnea was most likely of cardiac etiology.  Cardiology was consulted who recommend PRN IV Lasix, reducing Jardiance to 79m daily and holding BB until follow up with Cardiology outpatient  Pneumonia Checks x-ray on admission showed right middle lobe infiltrate consistent with pneumonia.  Patient was suspected to have failed outpatient treatment for community-acquired pneumonia.  Was started on empirical antibiotic initially broad-spectrum but narrowed after MRSA swab was negative.  She completed 5 days of antibiotics for which she was treated with IV cefepime..Marland Kitchen  PCP follow-up recommendations Reduced Jardiance to 10 mg daily  Will start beta blocker outpatient after follow up with cardiology. Hold off on Entresto, cardiology concerned about tolerance Lasix will br 267mdaily PRN per cards  Significant Procedures:  Right thoracentesis (6/13) Chest tube placement (6/15)  Significant Labs and Imaging:  Recent Labs  Lab 03/31/22 0124 04/01/22 0053 04/02/22 0356  WBC 4.5 3.9* 3.4*  HGB 9.3* 8.6* 9.7*  HCT 31.3* 28.3* 32.3*  PLT 120* 115* 122*   Recent Labs  Lab 03/27/22 0958 03/28/22 0043 03/29/22 0418 03/30/22 0258 03/31/22 0124 04/01/22 0053 04/02/22 0356  NA  --    < >  137 139 135 139 138  K  --    < > 3.8 3.9 3.6 3.8 3.8  CL  --    < > 98 102 103 104 105  CO2  --     < > 30 29 26 27 27   GLUCOSE  --    < > 121* 101* 87 178* 87  BUN  --    < > 26* 22 19 20 15   CREATININE  --    < > 1.42* 1.19* 1.03* 0.99 1.04*  CALCIUM  --    < > 8.5* 8.7* 8.4* 8.6* 8.8*  MG  --    < > 1.8 2.5* 2.2 1.9 2.0  ALBUMIN 2.9*  --   --   --   --   --   --    < > = values in this interval not displayed.     Results/Tests Pending at Time of Discharge: None  Discharge Medications:  Allergies as of 04/02/2022       Reactions   Bee Venom Shortness Of Breath, Swelling   Iohexol Shortness Of Breath, Other (See Comments)   CHEST TIGHTNESS and BREATHING PROBLEMS- NEEDS PRE-MEDS 13 hours beforehand-  premeds given 06/08/2020 without incident    Tape Other (See Comments)   Tears the skin!!   Betadine [povidone Iodine] Rash   Ciprofloxacin Rash   Latex Rash, Other (See Comments)   NO latex gloves!!   Penicillins Swelling   Did it involve swelling of the face/tongue/throat, SOB, or low BP? Yes Did it involve sudden or severe rash/hives, skin peeling, or any reaction on the inside of your mouth or nose? No Did you need to seek medical attention at a hospital or doctor's office? Yes When did it last happen?      1996 If all above answers are "NO", may proceed with cephalosporin use.   Povidone-iodine Rash        Medication List     STOP taking these medications    dextromethorphan-guaiFENesin 30-600 MG 12hr tablet Commonly known as: MUCINEX DM       TAKE these medications    acetaminophen 325 MG tablet Commonly known as: TYLENOL Take 2 tablets (650 mg total) by mouth every 4 (four) hours as needed for mild pain or fever.   albuterol (2.5 MG/3ML) 0.083% nebulizer solution Commonly known as: PROVENTIL Take 3 mLs (2.5 mg total) by nebulization every 6 (six) hours as needed for up to 30 doses for wheezing or shortness of breath. What changed: Another medication with the same name was changed. Make sure you understand how and when to take each.   albuterol 108 (90  Base) MCG/ACT inhaler Commonly known as: VENTOLIN HFA INHALE TWO PUFFS INTO THE LUNGS EVERY FOUR HOURS AS NEEDED FOR WHEEZING OR SHORTNESS OF BREATH What changed: See the new instructions.   aspirin 81 MG chewable tablet Chew 1 tablet (81 mg total) by mouth 2 (two) times daily.   b complex vitamins capsule Take 2 capsules by mouth daily.   BOIL-EASE EX Apply 1 application. topically daily as needed (Boil).   carbamide peroxide 6.5 % OTIC solution Commonly known as: DEBROX Place 5 drops into both ears daily as needed (ears).   colestipol 1 g tablet Commonly known as: COLESTID TAKE 2 TABLETS BY MOUTH ONCE DAILY. DO NOT TAKE WITHIN TWO HOURS OF OTHER MEDS. What changed:  how much to take how to take this when to take this additional instructions   COQ-10 PO Take  100 mg by mouth daily.   diazepam 5 MG tablet Commonly known as: VALIUM Take 2.5 mg by mouth 2 (two) times daily as needed for anxiety.   diclofenac Sodium 1 % Gel Commonly known as: VOLTAREN Apply 2 g topically as needed (pain).   docusate sodium 100 MG capsule Commonly known as: COLACE Take 1 capsule (100 mg total) by mouth 2 (two) times daily.   furosemide 20 MG tablet Commonly known as: Lasix Take 1 tablet (20 mg total) by mouth daily as needed for fluid or edema. Take for 3 lb weight gain in 24 hours or 5 lb weight gain over 7 days. What changed:  when to take this additional instructions   hydrocortisone cream 1 % Apply 1 application topically daily as needed for itching.   Iron 325 (65 Fe) MG Tabs Take 325 mg by mouth daily.   Jardiance 10 MG Tabs tablet Generic drug: empagliflozin Take 1 tablet (10 mg total) by mouth daily. Start taking on: April 03, 2022 What changed:  medication strength how much to take   levothyroxine 112 MCG tablet Commonly known as: SYNTHROID Take 112 mcg by mouth daily before breakfast.   lidocaine 5 % Commonly known as: Lidoderm Place 1 patch onto the skin  daily. Remove & Discard patch within 12 hours or as directed by MD   losartan 25 MG tablet Commonly known as: COZAAR Take 0.5 tablets (12.5 mg total) by mouth daily.   metFORMIN 500 MG tablet Commonly known as: GLUCOPHAGE Take 500 mg by mouth 2 (two) times daily with a meal.   Multi Vitamin Tabs Take 1 tablet by mouth daily.   neomycin-bacitracin-polymyxin Oint Commonly known as: NEOSPORIN Apply 1 application topically daily as needed for irritation or wound care.   NON FORMULARY Take 1 capsule by mouth daily. BRAIN HEALTH SUPPLEMENT   Omega 3 1000 MG Caps Take 1,000 mg by mouth daily.   pantoprazole 40 MG tablet Commonly known as: PROTONIX Take 40 mg by mouth daily.   PARoxetine 20 MG tablet Commonly known as: PAXIL Take 20 mg by mouth daily.   rosuvastatin 20 MG tablet Commonly known as: CRESTOR Take 20 mg by mouth daily.   traZODone 100 MG tablet Commonly known as: DESYREL Take 100 mg by mouth at bedtime.   Trelegy Ellipta 100-62.5-25 MCG/ACT Aepb Generic drug: Fluticasone-Umeclidin-Vilant Inhale 1 puff into the lungs daily.   Vitamin D3 50 MCG (2000 UT) capsule Take 6,000 Units by mouth daily.        Discharge Instructions: Please refer to Patient Instructions section of EMR for full details.  Patient was counseled important signs and symptoms that should prompt return to medical care, changes in medications, dietary instructions, activity restrictions, and follow up appointments.   Follow-Up Appointments: Future Appointments  Date Time Provider [place]  04/09/2022  2:30 PM Clayton Bibles, NP [facility] None  04/13/2022 11:30 AM [name], NP [facility] None  04/19/2022 10:00 AM [facility] None  04/26/2022  9:30 AM Curt Bears, MD [facility] None  05/14/2022  3:00 PM [name], MD [facility] None  08/28/2022  2:00 PM Crumpton, Wynona Neat, RD [facility] None    Alen Bleacher, MD 04/02/2022, 2:47 PM PGY-1, [place]

## 2022-04-02 NOTE — Discharge Instructions (Addendum)
Dear [name],   Thank you for letting us participate in your care! In this section, you will find a brief hospital admission summary of why you were admitted to the hospital and post-hospital plan.  You were admitted because you were experiencing worsening shortness of breath.  You were found to have fluid in your lungs which was drained and chest tube was placed to help get extra air out of your lungs.  The following changes were made Reduced your Jardiance to 10 mg daily Lasix will be as needed and your beta-blocker is on hold until you see your cardiologist.   POST-HOSPITAL & CARE INSTRUCTIONS Please follow-up with your pulmonology and cardiology follow-up appointments. Please let PCP/Specialists know of any changes in medications that were made.  Please see medications section of this packet for any medication changes.   DOCTOR'S APPOINTMENTS & FOLLOW UP Future Appointments  Date Time Provider [place]  04/09/2022  2:30 PM Clayton Bibles, NP [facility] None  04/13/2022 11:30 AM [name], NP [facility] None  04/19/2022 10:00 AM [facility] None  04/26/2022  9:30 AM Curt Bears, MD [facility] None  05/14/2022  3:00 PM [name], MD [facility] None  08/28/2022  2:00 PM Crumpton, Wynona Neat, RD [facility] None     Thank you for choosing [facility]! Take care and be well!  [facility]  [place], [place] [address] [phone]

## 2022-04-02 NOTE — Progress Notes (Signed)
  S:Went to check on patient, she was sleeping so I did not wake her.   O: BP 132/72 (BP Location: Left Arm)   Pulse 79   Temp (!) 97.5 F (36.4 C) (Oral)   Resp 16   Wt 54.2 kg   SpO2 96%   BMI 19.29 kg/m   General: Patient sleeping comfortably, in no acute distress. Resp: normal work of breathing noted  A/P: Vitals stable and orders reviewed. Continue plan per day team.   Donney Dice, DO 04/02/2022, 4:15 AM PGY-2, [name] [facility] pager [phone]

## 2022-04-02 NOTE — Progress Notes (Addendum)
Heart Failure Stewardship Pharmacist Progress Note   PCP: [name], MD PCP-Cardiologist: None    HPI:  [name] is a 71 y.o. 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   Last echo showed EF 30-35%, normal RV, abnormal septal motion consistent with LBBB, and trivial MR (03/2022). She is followed by Dr. Virgina Jock at [facility].   She presented with hypoxia. She has symptoms of hypoxia that are positional, with no cough, chest pain, or leg swelling. Pneumonia most likely given CT chest findings and respiratory status. Patient could also be suffering for CHF exacerbation given BNP and lung exam. Patient hypoxia likely multifactorial, with some component of pneumonia, and CHF exacerbation. She was discharged from the hospital on 03/18/2022 for PNA. She is currently on 3L [place].   03/26/2022 chest X-ray with right lower lobe and right middle lobe pneumonia superimposed upon chronic interstitial lung disease. CT chest on admission with new dense airspace consolidation in the medial right middle lobe and inferior right upper lobe. She underweight thoracentesis on 03/27/2022 with 1.4L of fluid removed, now with right pneumothorax. Chest tube inserted and removed 03/30/2022, but unfortunately had recurrence. Now stable and will be followed-up outpatient.    Current HF Medications: Diuretic: none Beta Blocker: none ACE/ARB/ARNI: Losartan 12.5 mg daily Aldosterone Antagonist: none SGLT2i: Jardiance 10 mg daily  Prior to admission HF Medications: Diuretic: furosemide 20 mg PRN Beta blocker: none ACE/ARB/ARNI: losartan 25 mg daily Aldosterone Antagonist: none SGLT2i: Jardiance 25 mg daily  Pertinent Lab  Values: Serum creatinine 1.04, BUN 15, Potassium 3.8, Sodium 138, BNP 996.4, A1c 5.8% 02/21/2022: ferritin 51, Tsat 15%  Vital Signs: Weight: 123 lbs (admission weight 124 lbs) Blood pressure: 95-120/50-70 mmHg  Heart rate: 75-80 bpm  I/O: not documented  Medication Assistance / Insurance Benefits Check: Does the patient have prescription insurance?  Yes Type of insurance plan: [employer] Medicare  Does the patient qualify for medication assistance through manufacturers or grants?   Yes Eligible grants and/or patient assistance programs: Jardiance and Entresto Medication assistance applications in progress: Jardiance and Entresto Medication assistance applications approved: none Approved medication assistance renewals will be completed by: Dr. Bonney Roussel office  Outpatient Pharmacy:  Prior to admission outpatient pharmacy: [facility] Is the patient willing to use [place] at discharge? Yes Is the patient willing to transition their outpatient pharmacy to utilize a [facility] outpatient pharmacy?   Pending   Assessment: 1. 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 NYHA class IV symptoms. -Patient on carboplatin regimen for SCLC, which is cardiotoxic and may be contributing to her cardiomyopathy. She has also underwent chest radiation for her SCLC which is known to be cardiotoxic. EF in 12/2019 was 55-60% when she underwent [facility] which also showed CTO of RCA and it was recommended she underwent intervention, however it was medically managed. No repeat echo until 03/2022 which showed new drop to 30-35%.   -Patient is euvolemic on exam. BNP elevated, but similar to recent discharge at 996 (1600 on admission on 03/15/2022). This appears to be more of a recurrence of PNA vs. HF exacerbation would recommend holding further furosemide.  -Patient would benefit from addition of spironolactone, however defer  until AKI resolved.  -  SBP ok and with Scr trending down after AKI, losartan 12.5 mg daily from home continued. (Scr 1.04, BL Scr 0.8-0.9), would recommend keeping SBP >110 to support renal perfusion.  -Patient on Jardiance 25 mg daily. Ok to continue.  -Per notes patient on beta-blocker and held by cardiologist with possible HF decompensation, however beta blocker not listed on PTA medication list or dispense report. Patient may benefit from bisoprolol given most B1 selective with known COPD on 2L [place] at [facility]. -Patient with low ferritin and Tsat meets criteria for IV replacement. Has never received IV iron in the past. S/p IV iron, continue current PO iron.  Plan: 1) Medication changes recommended at this time: -Consider adding bisoprolol 2.5 mg daily -Consider spironolactone 12.5 mg daily  2) Patient assistance: -completed 03/29/2022  3)  Education  - Patient has been educated on current HF medications and potential additions to HF medication regimen - Patient verbalizes understanding that over the next few months, these medication doses may change and more medications may be added to optimize HF regimen - Patient has been educated on basic disease state pathophysiology and goals of therapy   Cathrine Muster, PharmD, [place] PGY2 Cardiology Pharmacy Resident

## 2022-04-02 NOTE — Telephone Encounter (Signed)
Patient is scheduled on 6/26 at 2:30pm with Roxan Diesel, NP- appointment reminder mailed, sending to triage to advise on CXR order

## 2022-04-02 NOTE — Progress Notes (Signed)
Mobility Specialist Progress Note:   04/02/22 0950  Mobility  Activity  (bed level exercises)  Range of Motion/Exercises Active;All extremities  Level of Assistance Independent  Assistive Device None  Activity Response Tolerated well  $Mobility charge 1 Mobility   Pt difficult to arouse again this morning. Declined OOB mobility at this time d/t fatigue. Performed multiple exercises, however very limited. Pt left with all needs met, will f/u for ambulation later today.   Nelta Numbers Acute Rehab Secure Chat or Office Phone: [phone]

## 2022-04-02 NOTE — Progress Notes (Signed)
Patient given discharge instructions and stated understanding.

## 2022-04-02 NOTE — Progress Notes (Signed)
NAME:  [name], MRN:  [id number], DOB:  1951/03/13, LOS: 7 ADMISSION DATE:  03/26/2022, CONSULTATION DATE:  04/02/2022  REFERRING MD:  Nori Riis, MD, CHIEF COMPLAINT: Shortness of breath  History of Present Illness:  71 year old with COPD on home oxygen 3 L and history of small cell lung cancer admitted with shortness of breath for a few days and right-sided pleuritic chest pain around the right breast for 2 days. She was seen 5/26 and 6/1 in the office and treated with prednisone taper and antibiotics She was hospitalized 6/1 to 6/4 for community-acquired pneumonia  Admission chest x-ray showed right middle lower lobe consolidation, CT chest showed consolidation in the right middle lobe and inferior right upper lobe with partially loculated right-sided pleural effusion. Hence PCCM consulted.  Oxygen saturation on admission was 92% on 3 L.  She was started on vancomycin and cefepime BNP was 996 Rash was noted on the dorsum of her right foot  Pertinent  Medical History  HFrEF -EF 30 to 35%, LBBB COPD on home oxygen, 3 L, follows with Dr. [name] at [facility]  ILD, probable UIP Small cell lung cancer diagnosed 2021, status post 4 cycles of chemo and whole brain radiation 09/2020 for brain mets Cirrhosis of liver, on imaging CKD stage II, baseline creatinine was 0.8 Iron deficiency anemia   05/12/2020 PFTs: FEV1 2.2 (87%), FEV1% 77, DLCO 58%.  Unable to do lung volume testing  CT chest with contrast 10/2021 -decrease in size of right middle lobe lesion from 2.7 x 2.0 cm to 1.8 x 1.5 cm  Significant Hospital Events: Including procedures, antibiotic start and stop dates in addition to other pertinent events   6/13 Right thoracentesis -1400 cc of fluid removed , ex vacuo pneumothorax 6/13 pleural fluid results >> transudative, 79% lymphocytes 6/14 pneumothorax improved 03/29/2022 right chest tube placed 03/30/2022 right chest tube has been clamped->removed 6/17 pulm called back  for recurrent ptx   Interim History / Subjective:   No acute issues. Patient is requesting to go home.   Objective   Blood pressure 120/67, pulse 80, temperature (!) 97.4 F (36.3 C), temperature source Oral, resp. rate 17, weight 55.9 kg, SpO2 99 %.        Intake/Output Summary (Last 24 hours) at 04/02/2022 0954 Last data filed at 04/01/2022 1527 Gross per 24 hour  Intake 360 ml  Output --  Net 360 ml   Filed Weights   03/31/22 0820 04/01/22 0500 04/02/22 0500  Weight: 54.9 kg 54.2 kg 55.9 kg    Examination: General frail 71 year old female resting in bed no acute distress HEENT normocephalic atraumatic no jugular venous distention Pulmonary: right basilar rales, no accessory use Cardiac regular rate and rhythm Abdomen soft nontender Extremities warm dry  Resolved Hospital Problem list   CAP   Assessment & Plan:   Right pleural effusion (transudate) Pneumothorax Right  ILD-no significant airway obstruction on PFTs and hypoxia is more likely on the basis of pulmonary fibrosis.  Not considered  a candidate for antifibrotic's due to liver disease Chronic respiratory failure  Heart failure  H/o Small Cell lung cancer   Plan: - Pneumothorax remains stable on last x-ray 6/18 - She is scheduled for follow up in pulmonary clinic on 6/26 at 2:30pm with Roxan Diesel, NP - Patient stable for discharge from respiratory standpoint. She does live alone so recommend PT consult to assess functional ability prior to going home for further safety recs.   PCCM will sign off. Please call  with any further questions.   Best Practice (right click and "Reselect all SmartList Selections" daily)   Per primary service Code Status:  DNR  Freda Jackson, MD [place] [facility] Office: [phone]   See Amion for personal pager PCCM on call pager [phone] until 7pm. Please call Elink 7p-7a. [phone]

## 2022-04-02 NOTE — Plan of Care (Signed)

## 2022-04-02 NOTE — Progress Notes (Signed)
Mobility Specialist Progress Note:   04/02/22 1220  Mobility  Activity Ambulated independently to bathroom  Level of Assistance Standby assist, set-up cues, supervision of patient - no hands on  Assistive Device None  Distance Ambulated (ft) 30 ft  Activity Response Tolerated well  $Mobility charge 1 Mobility   Responded to bed alarm. Pt found in BR. No AD needed throughout. Pt back in bed with all needs met. Bed alarm on.  Nelta Numbers Acute Rehab Secure Chat or Office Phone: [phone]

## 2022-04-03 ENCOUNTER — Telehealth: Payer: Self-pay | Admitting: Internal Medicine

## 2022-04-03 NOTE — Telephone Encounter (Signed)
Called patient regarding upcoming July appointment, patient has been called and voicemail was left.

## 2022-04-09 ENCOUNTER — Other Ambulatory Visit: Payer: Self-pay

## 2022-04-09 ENCOUNTER — Inpatient Hospital Stay: Payer: Medicare HMO | Admitting: Nurse Practitioner

## 2022-04-09 DIAGNOSIS — J441 Chronic obstructive pulmonary disease with (acute) exacerbation: Secondary | ICD-10-CM

## 2022-04-11 DIAGNOSIS — I509 Heart failure, unspecified: Secondary | ICD-10-CM | POA: Diagnosis not present

## 2022-04-11 DIAGNOSIS — E782 Mixed hyperlipidemia: Secondary | ICD-10-CM | POA: Diagnosis not present

## 2022-04-11 DIAGNOSIS — M6281 Muscle weakness (generalized): Secondary | ICD-10-CM | POA: Diagnosis not present

## 2022-04-11 DIAGNOSIS — E1165 Type 2 diabetes mellitus with hyperglycemia: Secondary | ICD-10-CM | POA: Diagnosis not present

## 2022-04-11 DIAGNOSIS — G894 Chronic pain syndrome: Secondary | ICD-10-CM | POA: Diagnosis not present

## 2022-04-11 DIAGNOSIS — K589 Irritable bowel syndrome without diarrhea: Secondary | ICD-10-CM | POA: Diagnosis not present

## 2022-04-11 DIAGNOSIS — E039 Hypothyroidism, unspecified: Secondary | ICD-10-CM | POA: Diagnosis not present

## 2022-04-11 DIAGNOSIS — G250 Essential tremor: Secondary | ICD-10-CM | POA: Diagnosis not present

## 2022-04-11 DIAGNOSIS — J449 Chronic obstructive pulmonary disease, unspecified: Secondary | ICD-10-CM | POA: Diagnosis not present

## 2022-04-11 DIAGNOSIS — I10 Essential (primary) hypertension: Secondary | ICD-10-CM | POA: Diagnosis not present

## 2022-04-12 LAB — CYTOLOGY - NON PAP

## 2022-04-13 ENCOUNTER — Inpatient Hospital Stay: Payer: Medicare HMO | Admitting: Acute Care

## 2022-04-19 ENCOUNTER — Inpatient Hospital Stay: Payer: Medicare HMO | Attending: Internal Medicine

## 2022-04-19 ENCOUNTER — Telehealth: Payer: Self-pay | Admitting: Pulmonary Disease

## 2022-04-19 ENCOUNTER — Ambulatory Visit: Payer: Medicare HMO | Admitting: Internal Medicine

## 2022-04-19 MED ORDER — TRELEGY ELLIPTA 100-62.5-25 MCG/ACT IN AEPB: 1.0000 | INHALATION_SPRAY | Freq: Every day | RESPIRATORY_TRACT | 0 refills | Status: DC

## 2022-04-19 MED ORDER — TRELEGY ELLIPTA 100-62.5-25 MCG/ACT IN AEPB: 1.0000 | INHALATION_SPRAY | Freq: Every day | RESPIRATORY_TRACT | 0 refills | Status: AC

## 2022-04-19 NOTE — Telephone Encounter (Signed)
Called and notified patient that samples would be at front for her.  Also offered to give her [place] paper work and she accepted. Both placed at front for pick up.

## 2022-04-23 ENCOUNTER — Ambulatory Visit: Payer: Medicare HMO | Admitting: Cardiology

## 2022-04-23 IMAGING — MR MR HEAD WO/W CM
13 series · 48 of 48 positions shown · IV contrast (gadavist)
Comparison: MRI of the brain May 04, 2021.

CLINICAL DATA: Small cell lung cancer, staging.

EXAM:
MRI HEAD WITHOUT AND WITH CONTRAST
TECHNIQUE: Multiplanar, multiecho pulse sequences of the brain and surrounding
structures were obtained without and with intravenous contrast.
CONTRAST:  6mL GADAVIST GADOBUTROL 1 MMOL/ML IV SOLN

[Series 5: DWI · axial · 3.0mm · 1.36mm/px · z∈[-27,+122]mm · 6 of 103 slices shown (1 of 2)]
[im 1/103]
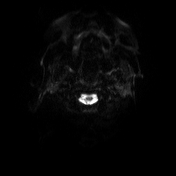
[im 21/103]
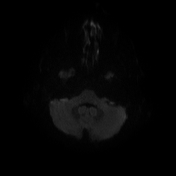
[im 41/103]
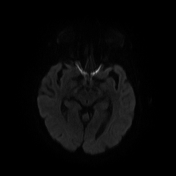
[im 62/103]
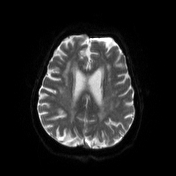
[im 82/103]
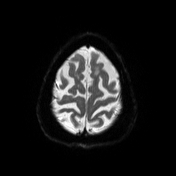
[im 103/103]
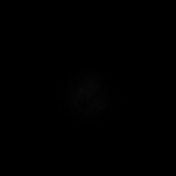

[Series 6: DWI · axial · 3.0mm · 1.36mm/px · z∈[-27,+122]mm · 3 of 52 slices shown (2 of 2)]
[im 1/52]
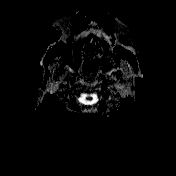
[im 26/52]
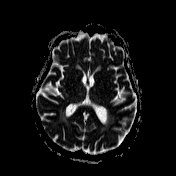
[im 52/52]
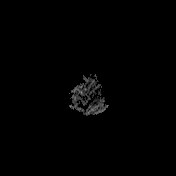

[Series 7: T1 · sagittal · 5.0mm · 0.75mm/px · 1 of 24 slices shown (1 of 2)]
[im 1/24]
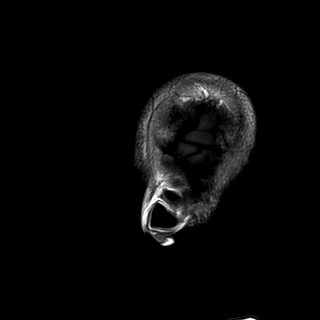

[Series 8: T2 · axial · 5.0mm · 0.62mm/px · z∈[-35,+123]mm · 2 of 26 slices shown]
[im 1/26]
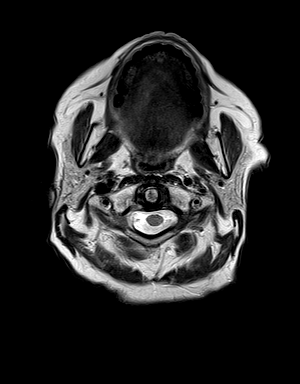
[im 26/26]
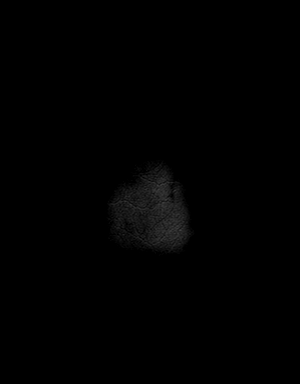

[Series 9: swi_images · axial · 3.0mm · 0.75mm/px · z∈[-36,+124]mm · 3 of 56 slices shown]
[im 1/56]
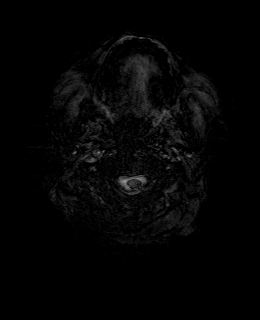
[im 28/56]
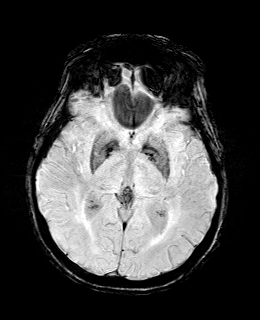
[im 56/56]
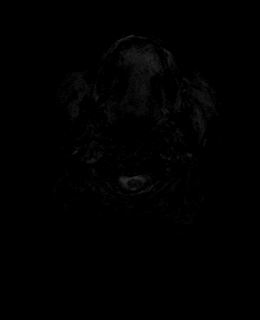

[Series 11: FLAIR · axial · 3.0mm · 0.75mm/px · z∈[-33,+121]mm · 3 of 54 slices shown]
[im 1/54]
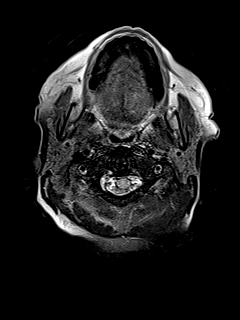
[im 27/54]
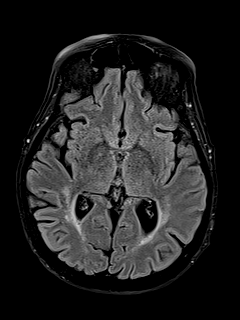
[im 54/54]
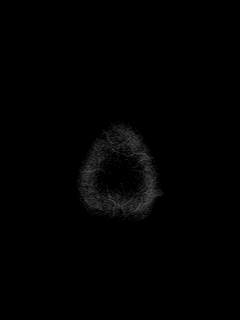

[Series 12: T1 · axial · 1.0mm · 0.94mm/px · z∈[-39,+131]mm · 10 of 176 slices shown (2 of 2)]
[im 1/176]
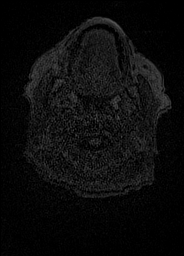
[im 20/176]
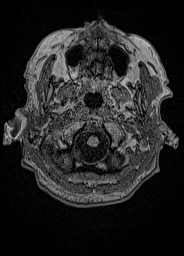
[im 39/176]
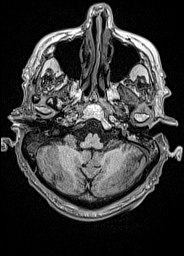
[im 59/176]
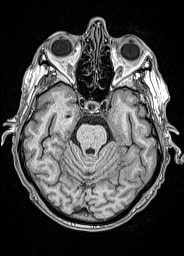
[im 78/176]
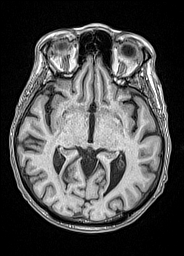
[im 98/176]
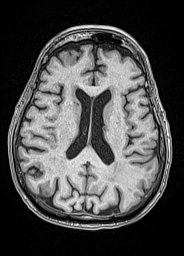
[im 117/176]
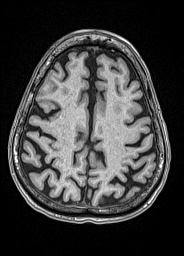
[im 137/176]
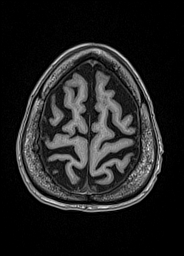
[im 156/176]
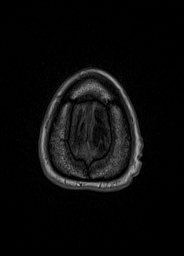
[im 176/176]
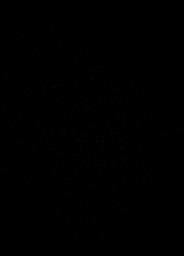

[Series 13: cor dwi_tracew · coronal · 5.0mm · 1.53mm/px · 3 of 54 slices shown]
[im 1/54]
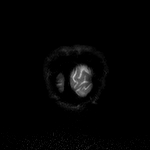
[im 27/54]
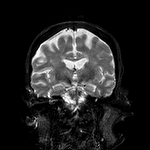
[im 54/54]
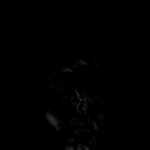

[Series 14: cor dwi_adc · coronal · 5.0mm · 1.53mm/px · 2 of 27 slices shown]
[im 1/27]
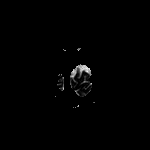
[im 27/27]
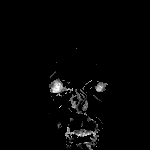

[Series 15: T2 post-contrast · coronal · 5.0mm · 0.57mm/px · 2 of 28 slices shown]
[im 1/28]
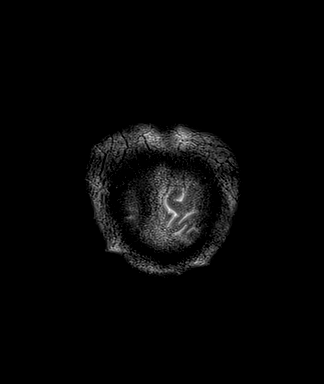
[im 28/28]
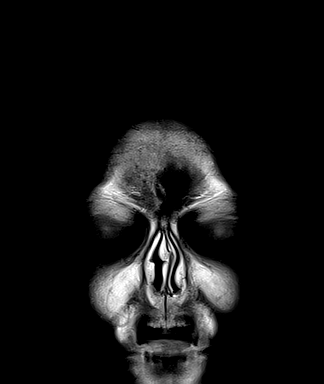

[Series 16: T1 post-contrast · axial · 1.0mm · 0.94mm/px · z∈[-39,+131]mm · 10 of 176 slices shown (1 of 3)]
[im 1/176]
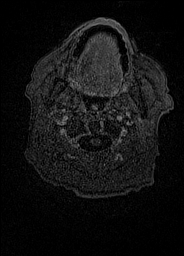
[im 20/176]
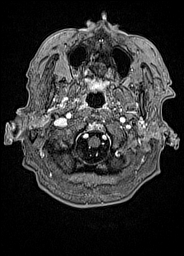
[im 39/176]
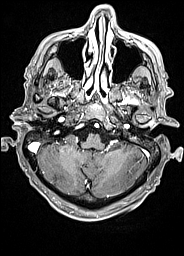
[im 59/176]
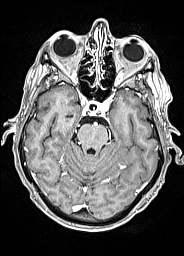
[im 78/176]
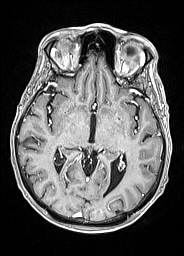
[im 98/176]
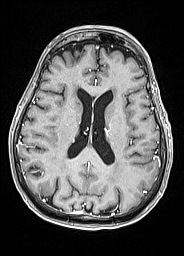
[im 117/176]
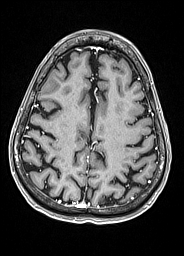
[im 137/176]
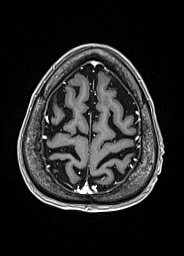
[im 156/176]
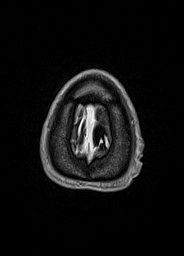
[im 176/176]
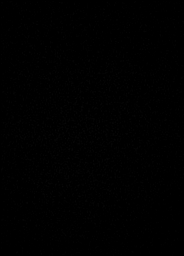

[Series 17: T1 post-contrast · coronal · 5.0mm · 0.43mm/px · 2 of 28 slices shown (2 of 3)]
[im 1/28]
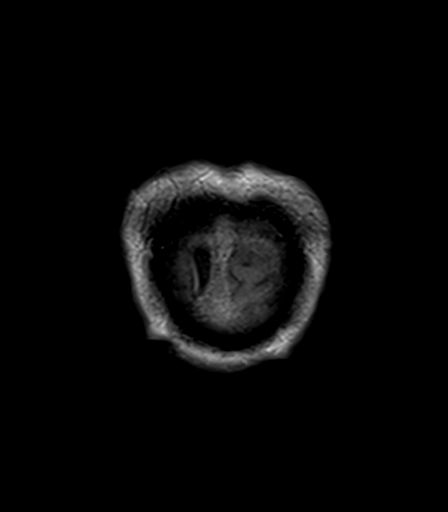
[im 28/28]
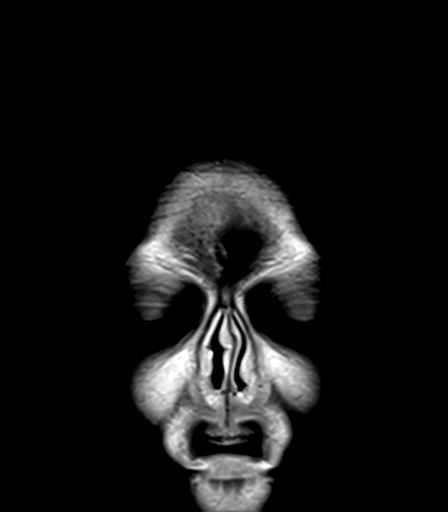

[Series 18: T1 post-contrast · sagittal · 5.0mm · 0.75mm/px · 1 of 24 slices shown (3 of 3)]
[im 1/24]
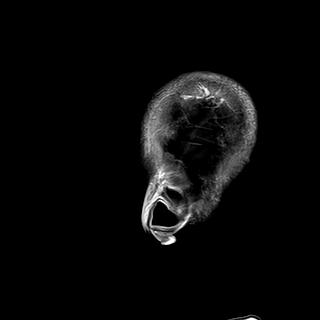

[48 of 48 positions shown; findings below may reference images not displayed]

FINDINGS: Brain: No acute infarction, hemorrhage, hydrocephalus or extra-axial
collection.

Further and interval decrease in size of the left cerebellar
hemisphere enhancing lesion, now measuring 6 mm compared to 9 mm on
prior. No new focus of abnormal contrast enhancement identified.

Scattered foci of T2 hyperintensity are seen within the white matter
of the cerebral hemispheres, stable.

Vascular: Normal flow voids.

Skull and upper cervical spine: Normal marrow signal.

Sinuses/Orbits: Bilateral lens surgery. Paranasal sinuses are clear.

Other: Bilateral mastoid effusion.
IMPRESSION: 1. Further interval decrease in size of the metastatic left
cerebellar hemisphere lesion, now measuring 6 mm.
2. No new lesion identified.

## 2022-04-26 ENCOUNTER — Inpatient Hospital Stay: Payer: Medicare HMO | Admitting: Internal Medicine

## 2022-04-27 ENCOUNTER — Ambulatory Visit: Payer: Medicare HMO | Admitting: Surgical

## 2022-04-27 LAB — FUNGUS CULTURE WITH STAIN

## 2022-04-27 LAB — FUNGAL ORGANISM REFLEX

## 2022-04-27 LAB — FUNGUS CULTURE RESULT

## 2022-04-30 ENCOUNTER — Emergency Department (HOSPITAL_COMMUNITY)
Admission: EM | Admit: 2022-04-30 | Discharge: 2022-04-30 | Disposition: A | Discharge status: Home / Self Care | Payer: Medicare HMO | Attending: Emergency Medicine | Admitting: Emergency Medicine

## 2022-04-30 ENCOUNTER — Telehealth: Payer: Self-pay | Admitting: *Deleted

## 2022-04-30 ENCOUNTER — Other Ambulatory Visit: Payer: Self-pay

## 2022-04-30 ENCOUNTER — Encounter (HOSPITAL_COMMUNITY): Payer: Self-pay | Admitting: *Deleted

## 2022-04-30 DIAGNOSIS — Z5321 Procedure and treatment not carried out due to patient leaving prior to being seen by health care provider: Secondary | ICD-10-CM | POA: Diagnosis not present

## 2022-04-30 DIAGNOSIS — J9611 Chronic respiratory failure with hypoxia: Secondary | ICD-10-CM | POA: Diagnosis not present

## 2022-04-30 DIAGNOSIS — R197 Diarrhea, unspecified: Secondary | ICD-10-CM | POA: Diagnosis not present

## 2022-04-30 DIAGNOSIS — J849 Interstitial pulmonary disease, unspecified: Secondary | ICD-10-CM | POA: Diagnosis not present

## 2022-04-30 LAB — COMPREHENSIVE METABOLIC PANEL
ALT: 13 U/L (ref 0–44)
AST: 14 U/L — ABNORMAL LOW (ref 15–41)
Albumin: 3.1 g/dL — ABNORMAL LOW (ref 3.5–5.0)
Alkaline Phosphatase: 50 U/L (ref 38–126)
Anion gap: 6 (ref 5–15)
BUN: 13 mg/dL (ref 8–23)
CO2: 26 mmol/L (ref 22–32)
Calcium: 8.9 mg/dL (ref 8.9–10.3)
Chloride: 104 mmol/L (ref 98–111)
Creatinine, Ser: 0.76 mg/dL (ref 0.44–1.00)
GFR, Estimated: >60 mL/min (ref >60)
Glucose, Bld: 127 mg/dL — ABNORMAL HIGH (ref 70–99)
Potassium: 3.7 mmol/L (ref 3.5–5.1)
Sodium: 136 mmol/L (ref 135–145)
Total Bilirubin: 0.4 mg/dL (ref 0.3–1.2)
Total Protein: 6.5 g/dL (ref 6.5–8.1)

## 2022-04-30 LAB — CBC
HCT: 32.5 % — ABNORMAL LOW (ref 36.0–46.0)
Hemoglobin: 9.8 g/dL — ABNORMAL LOW (ref 12.0–15.0)
MCH: 30.2 pg (ref 26.0–34.0)
MCHC: 30.2 g/dL (ref 30.0–36.0)
MCV: 100 fL (ref 80.0–100.0)
Platelets: 177 10*3/uL (ref 150–400)
RBC: 3.25 MIL/uL — ABNORMAL LOW (ref 3.87–5.11)
RDW: 17.6 % — ABNORMAL HIGH (ref 11.5–15.5)
WBC: 4 10*3/uL (ref 4.0–10.5)
nRBC: 0 % (ref 0.0–0.2)

## 2022-04-30 LAB — LIPASE, BLOOD: Lipase: 27 U/L (ref 11–51)

## 2022-04-30 NOTE — Telephone Encounter (Signed)
Spoke with patient.  Reports new onset black stools for the last week.  She does take iron, but has been on iron chronically and has never had any issues with black stools in the past.   Also notes some coffee-ground appearance to her stools. 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  She is also had new onset diarrhea with multiple bowel movements daily, nocturnal stools.  Colestipol is no longer helping.  I recommended ER evaluation.  Patient is agreeable.  I also spoke with patient's caregiver [name] who agrees to take patient to the emergency room today.

## 2022-04-30 NOTE — ED Triage Notes (Signed)
Pt with diarrhea x 1 week.  Instructed by PCP to come here.  Denies any sick contacts.  Pt denies taking anything for it.

## 2022-04-30 NOTE — Telephone Encounter (Signed)
Spoke to pt's caregiver [name] (DPR), she informed me that pt is having black tarry loose stools. It's been about 4 days. It's now diarrhea. Caregiver call PCP and they recommended she go to ED. Pt refused to go. Caregiver states that Colestipol is no longer working for her. She would also like a prescription for Glucerna, because pt is not eating.

## 2022-04-30 NOTE — Telephone Encounter (Signed)
CALLED PATIENT TO INFORM OF MRI AND FU WITH DR. KINARD, SPOKE WITH FRIEND TERESA RUHLAND AND SHE STATED THAT PATIENT DOESN'T WANT TO HAVE MRI DUE TO BE DIAGNOSED WITH  CONGESTIVE HEART FAILURE, INFORMED PATIENT'S FRIEND TERESA RUHLAND THAT I WOULD SEND DR. KINARD AN IN-BASKET MESSAGE, PATIENT'S FRIEND TERESA RUHLAND STATED THAT SHE UNDERSTOOD THIS

## 2022-05-01 ENCOUNTER — Other Ambulatory Visit: Payer: Self-pay | Admitting: *Deleted

## 2022-05-01 DIAGNOSIS — R197 Diarrhea, unspecified: Secondary | ICD-10-CM

## 2022-05-01 NOTE — Telephone Encounter (Signed)
Double checked labs completed yesterday. Hgb was 9.8.

## 2022-05-01 NOTE — Telephone Encounter (Signed)
Noted  

## 2022-05-01 NOTE — Telephone Encounter (Signed)
Spoke to pt's caregiver [name] [facility]) informed her to have stools studies completed at [facility]. She voiced understanding. Informed of labs results. Caregiver informed me that she would like to speak to provider to make sure that was the right date of the hemoglobin, because she thinks hemoglobin was lower than stated.

## 2022-05-01 NOTE — Telephone Encounter (Signed)
Patient did go to the emergency room yesterday, but did not stay for complete evaluation.  She did have blood work completed that showed a stable hemoglobin of 9.8 suggesting black stool is not secondary to GI bleeding. No other significant laboratory abnormalities.   Recommend checking C diff and GI path panel, giardia, cryptosporidium through [facility] due to new onset diarrhea. Please arrange.   If she develops weakness, lightheadedness, dizziness, return to the ED.

## 2022-05-07 ENCOUNTER — Other Ambulatory Visit: Payer: Self-pay

## 2022-05-14 ENCOUNTER — Ambulatory Visit: Payer: Self-pay | Admitting: Radiation Oncology

## 2022-05-14 DIAGNOSIS — J449 Chronic obstructive pulmonary disease, unspecified: Secondary | ICD-10-CM | POA: Diagnosis not present

## 2022-05-14 DIAGNOSIS — E1165 Type 2 diabetes mellitus with hyperglycemia: Secondary | ICD-10-CM | POA: Diagnosis not present

## 2022-05-14 DIAGNOSIS — N1832 Chronic kidney disease, stage 3b: Secondary | ICD-10-CM | POA: Diagnosis not present

## 2022-05-14 DIAGNOSIS — I129 Hypertensive chronic kidney disease with stage 1 through stage 4 chronic kidney disease, or unspecified chronic kidney disease: Secondary | ICD-10-CM | POA: Diagnosis not present

## 2022-05-24 ENCOUNTER — Telehealth: Payer: Self-pay | Admitting: *Deleted

## 2022-05-24 ENCOUNTER — Other Ambulatory Visit (HOSPITAL_COMMUNITY): Payer: Self-pay | Admitting: Family Medicine

## 2022-05-24 ENCOUNTER — Other Ambulatory Visit (HOSPITAL_COMMUNITY): Payer: Medicare HMO

## 2022-05-24 DIAGNOSIS — S82001D Unspecified fracture of right patella, subsequent encounter for closed fracture with routine healing: Secondary | ICD-10-CM

## 2022-05-24 NOTE — Telephone Encounter (Signed)
CALLED PATIENT TO ASK IF SHE WOULD LIKE TO HAVE AN MRI THAT DR. KINARD ORDERED, PATIENT DECLINED TO HAVE THIS SCAN, INFORMED DR. [name]

## 2022-05-31 ENCOUNTER — Ambulatory Visit: Payer: Medicare HMO | Admitting: Radiation Oncology

## 2022-06-02 DIAGNOSIS — R69 Illness, unspecified: Secondary | ICD-10-CM | POA: Diagnosis not present

## 2022-06-02 DIAGNOSIS — R5381 Other malaise: Secondary | ICD-10-CM | POA: Diagnosis not present

## 2022-06-07 DIAGNOSIS — J449 Chronic obstructive pulmonary disease, unspecified: Secondary | ICD-10-CM | POA: Diagnosis not present

## 2022-06-07 DIAGNOSIS — E1165 Type 2 diabetes mellitus with hyperglycemia: Secondary | ICD-10-CM | POA: Diagnosis not present

## 2022-06-07 DIAGNOSIS — I129 Hypertensive chronic kidney disease with stage 1 through stage 4 chronic kidney disease, or unspecified chronic kidney disease: Secondary | ICD-10-CM | POA: Diagnosis not present

## 2022-06-07 DIAGNOSIS — N1832 Chronic kidney disease, stage 3b: Secondary | ICD-10-CM | POA: Diagnosis not present

## 2022-07-05 IMAGING — CT CT ABD-PELV W/ CM
2 of 5 series · 13 of 36 positions shown, 16 images · IV contrast (omnipaque)
Comparison: 06/21/2021

CLINICAL DATA: Restaging small cell lung cancer.

EXAM:
CT CHEST, ABDOMEN, AND PELVIS WITH CONTRAST
TECHNIQUE: Multidetector CT imaging of the chest, abdomen and pelvis was
performed following the standard protocol during bolus
administration of intravenous contrast.
CONTRAST:  75mL OMNIPAQUE IOHEXOL 350 MG/ML SOLN

[Series 2: cap with · axial · 0.86mm/px · z∈[-605,-85]mm · 10 of 128 slices shown, 13 images]
[im 12/128  mediastinal]
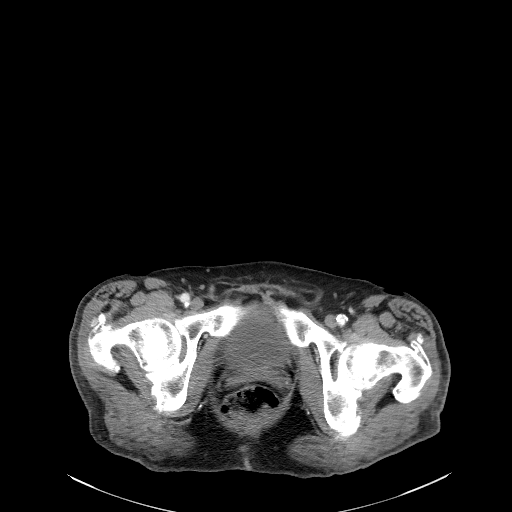
[im 12/128  lung]
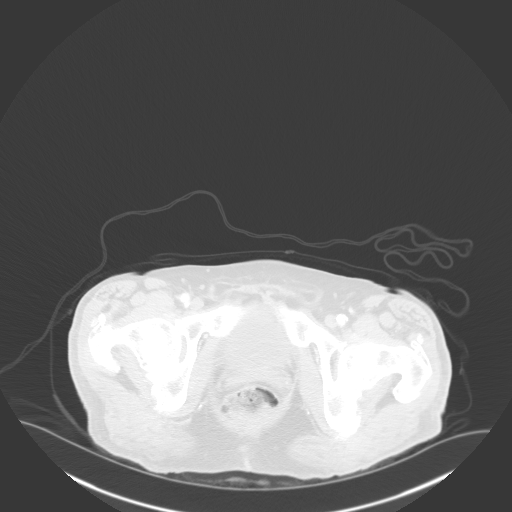
[im 24/128  lung]
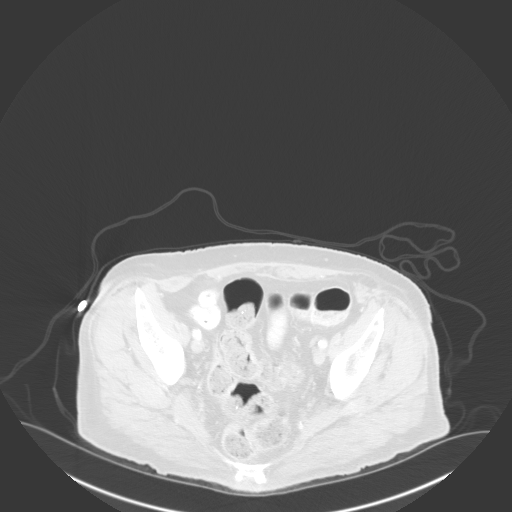
[im 35/128  lung]
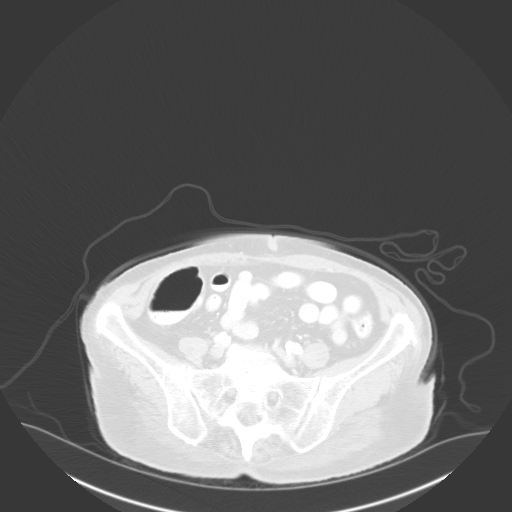
[im 47/128  lung]
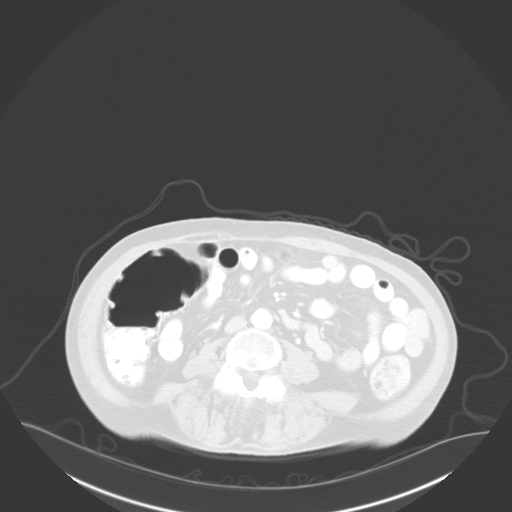
[im 58/128  mediastinal]
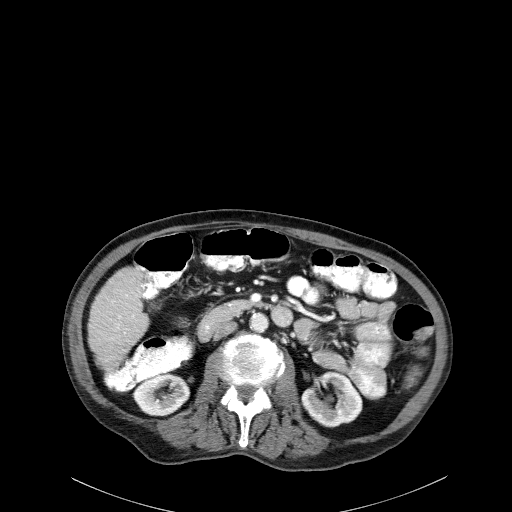
[im 58/128  lung]
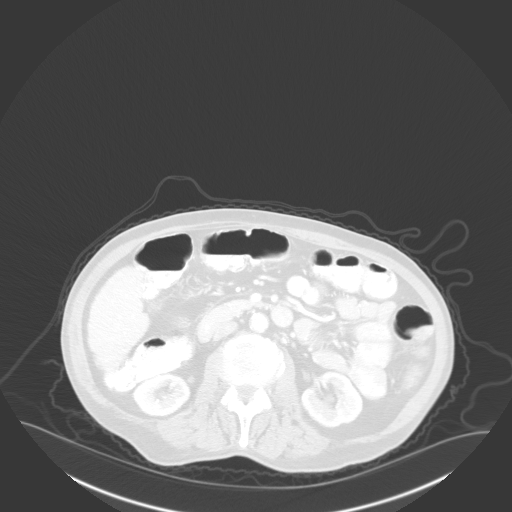
[im 70/128  lung]
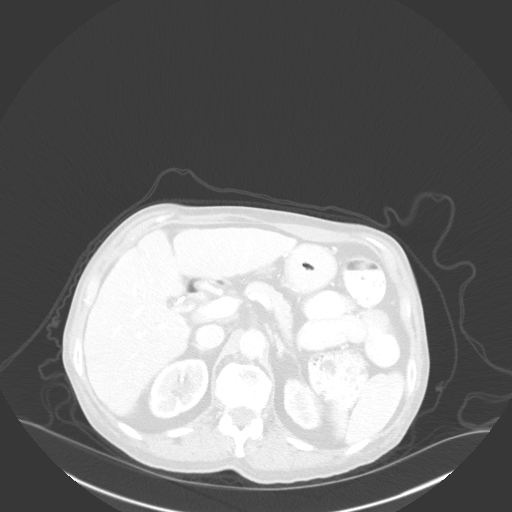
[im 81/128  lung]
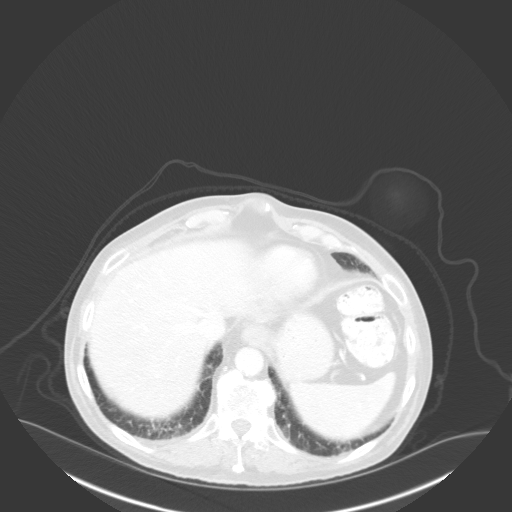
[im 93/128  lung]
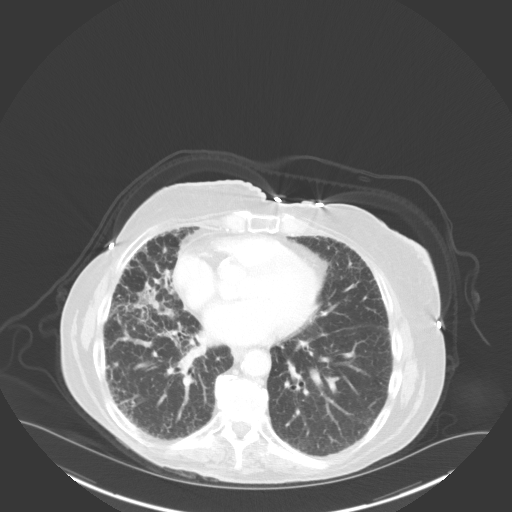
[im 104/128  mediastinal]
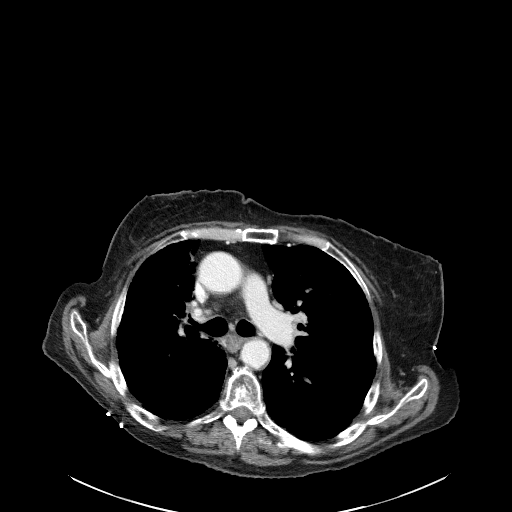
[im 104/128  lung]
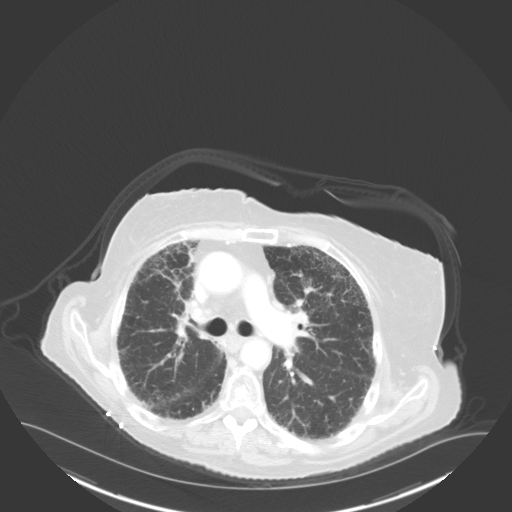
[im 116/128  lung]
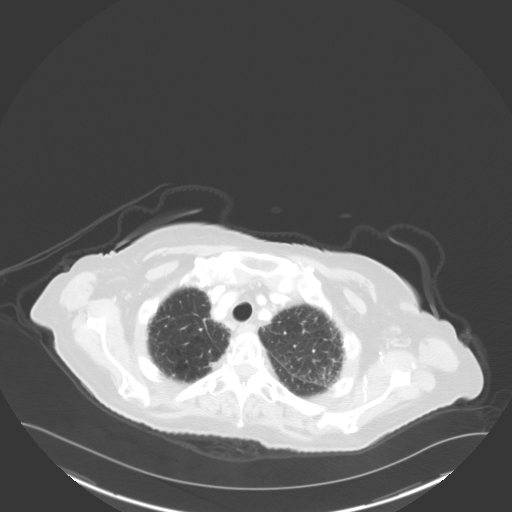

[Series 5: coronals · coronal · 0.86mm/px · 3 of 125 slices shown]
[im 25/125  lung]
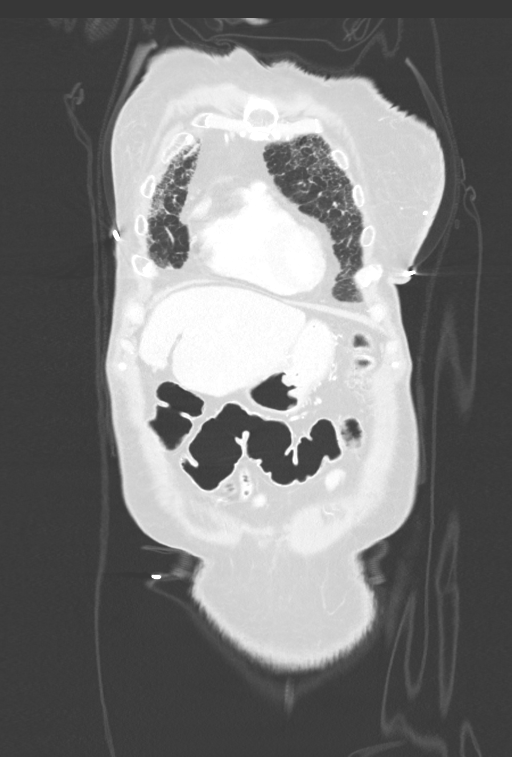
[im 50/125  lung]
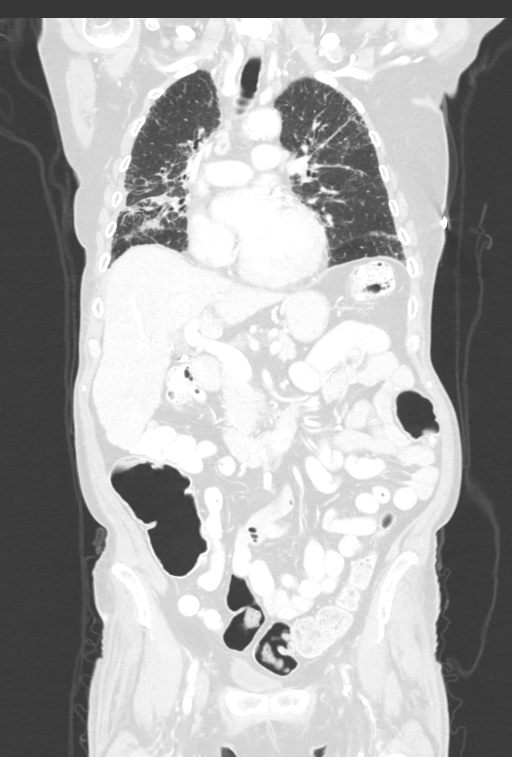
[im 75/125  lung]
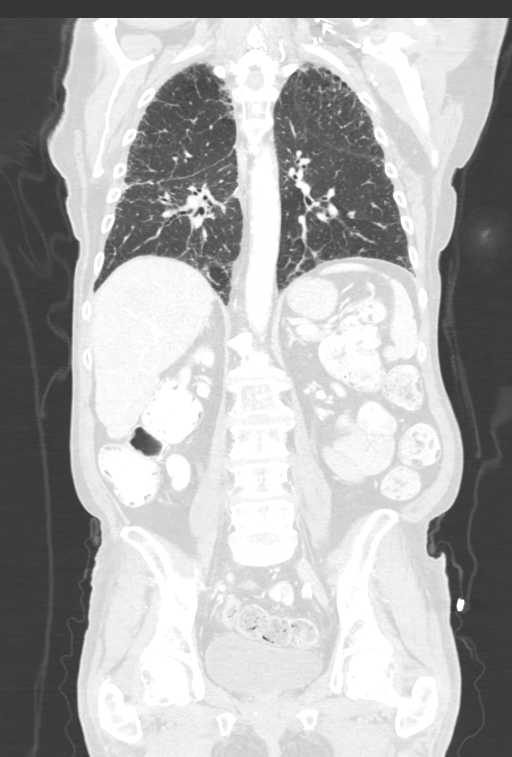

[13 of 36 positions shown; findings below may reference images not displayed]

FINDINGS: CT CHEST FINDINGS

Cardiovascular: Normal heart size. No pericardial effusion. Aortic
atherosclerosis with coronary artery calcifications.

Mediastinum/Nodes: Normal appearance of the thyroid gland. The
trachea appears patent and is midline. Normal appearance of the
esophagus. No enlarged mediastinal or hilar lymph nodes. No axillary
or supraclavicular adenopathy.

Lungs/Pleura: Mild changes of paraseptal and centrilobular
emphysema. No pleural effusion identified. No acute airspace
consolidation. Changes of pulmonary fibrosis are again noted
including diffuse peripheral predominant interstitial reticulation,
septal thickening, and traction bronchiectasis.

The treated lesion within the right middle lobe measures 1.8 x
cm on today's study, image 70/4. On the previous exam this area was
measured at 2.7 x 2.0 cm. Stable soft tissue thickening surrounding
the adjacent hilar structures.

Musculoskeletal: No chest wall mass or suspicious bone lesions
identified.

CT ABDOMEN PELVIS FINDINGS

Hepatobiliary: Nodular contour the liver is again noted suggestive
of cirrhosis. Status post cholecystectomy. No bile duct dilatation.

Pancreas: Unremarkable. No pancreatic ductal dilatation or
surrounding inflammatory changes.

Spleen: Normal in size without focal abnormality.

Adrenals/Urinary Tract: Adrenal glands are unremarkable. Kidneys are
normal, without renal calculi, focal lesion, or hydronephrosis.
Bladder is unremarkable.

Stomach/Bowel: Stomach is within normal limits. Appendix appears
normal. No evidence of bowel wall thickening, distention, or
inflammatory changes.

Vascular/Lymphatic: Aortic atherosclerosis. No aneurysm. No
abdominopelvic adenopathy identified.

Reproductive: Status post hysterectomy. No adnexal masses.

Other: No abdominal wall hernia or abnormality. No abdominopelvic
ascites.

Musculoskeletal: No acute or significant osseous findings.
IMPRESSION: 1. Continued decrease in size of treated lesion within the right
middle lobe. No new or progressive disease identified.
2. Morphologic features of the liver suggestive of cirrhosis.
3. Chronic interstitial lung disease is identified compatible with
pulmonary fibrosis. More definitive characterization could be
obtained with high-resolution CT of the chest if clinically
indicated.
4. Coronary artery atherosclerotic calcifications.
5. Aortic Atherosclerosis ([id number]) and Emphysema ([id number]).

## 2022-07-05 IMAGING — CT CT CHEST W/ CM
2 of 5 series · 13 of 36 positions shown, 16 images · IV contrast (OMNIPAQUE)
Comparison: 06/21/2021

CLINICAL DATA: Restaging small cell lung cancer.

EXAM:
CT CHEST, ABDOMEN, AND PELVIS WITH CONTRAST
TECHNIQUE: Multidetector CT imaging of the chest, abdomen and pelvis was
performed following the standard protocol during bolus
administration of intravenous contrast.
CONTRAST:  75mL OMNIPAQUE IOHEXOL 350 MG/ML SOLN

[Series 2: cap with · axial · 0.86mm/px · z∈[-605,-85]mm · 10 of 128 slices shown, 13 images]
[im 12/128  mediastinal]
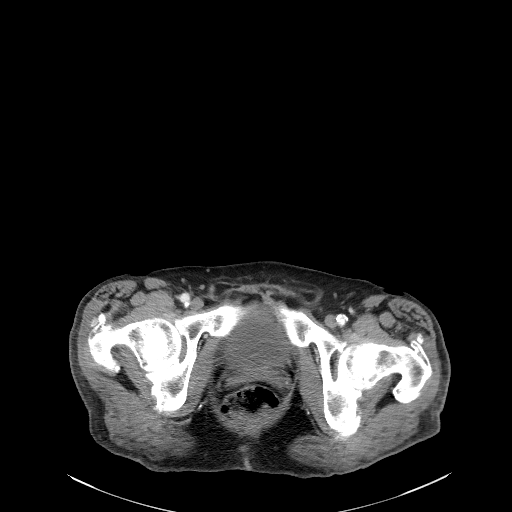
[im 12/128  lung]
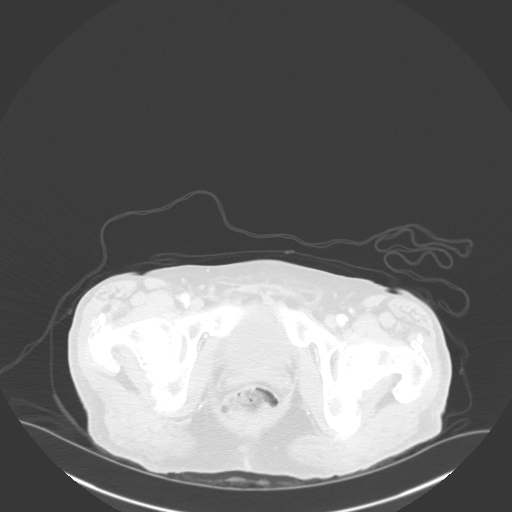
[im 24/128  lung]
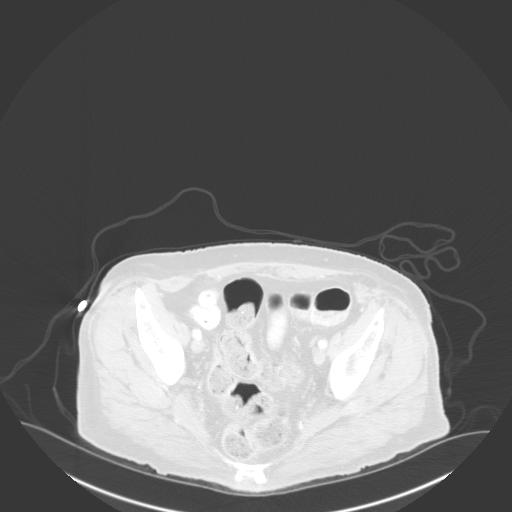
[im 35/128  lung]
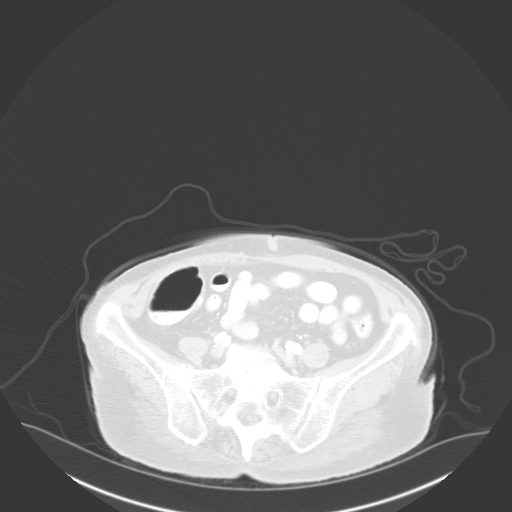
[im 47/128  lung]
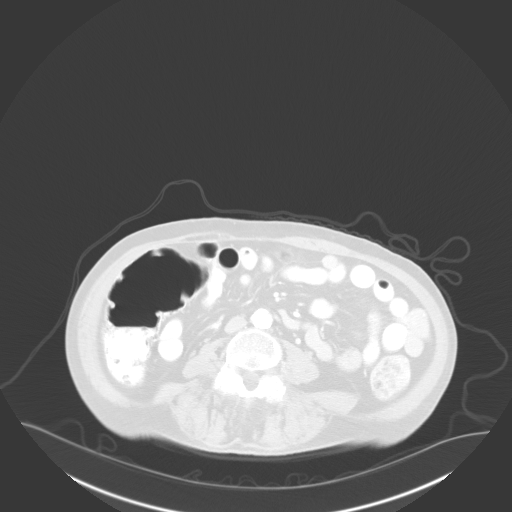
[im 58/128  mediastinal]
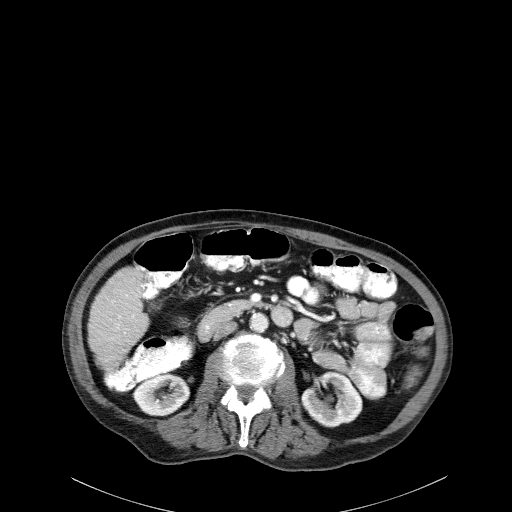
[im 58/128  lung]
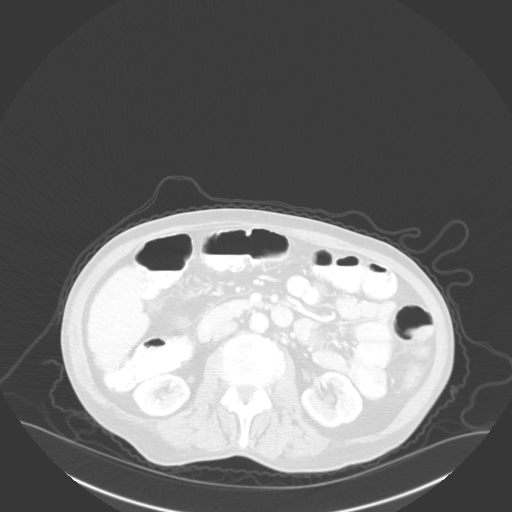
[im 70/128  lung]
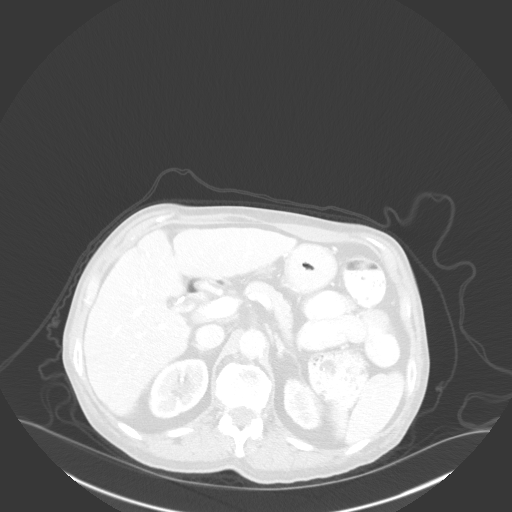
[im 81/128  lung]
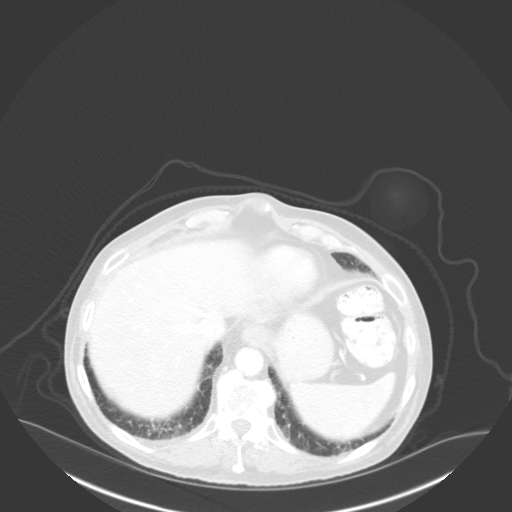
[im 93/128  lung]
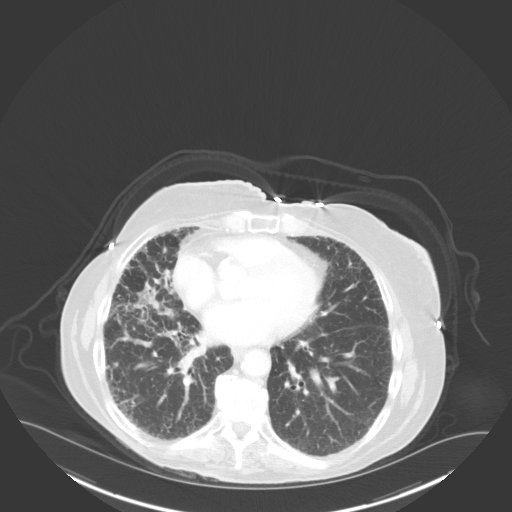
[im 104/128  mediastinal]
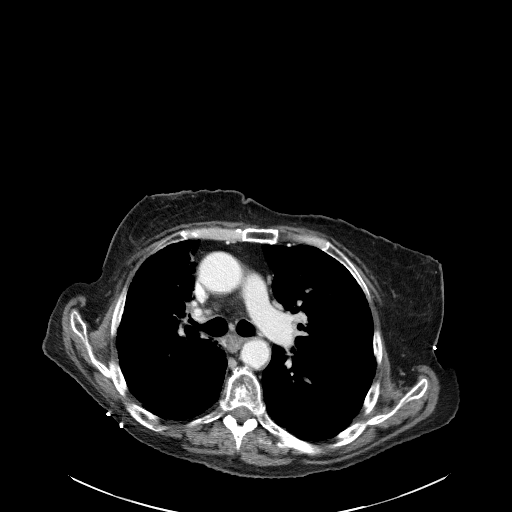
[im 104/128  lung]
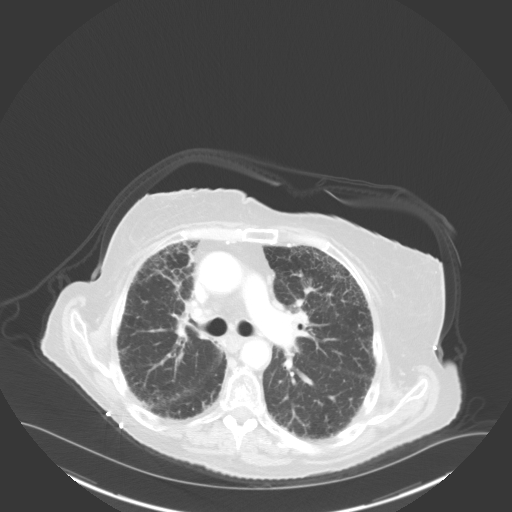
[im 116/128  lung]
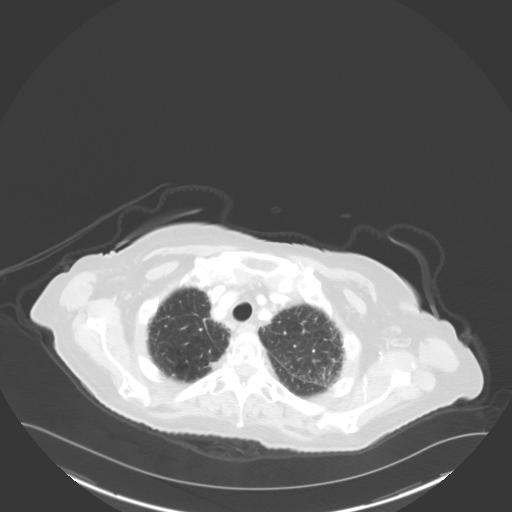

[Series 5: coronals · coronal · 0.86mm/px · 3 of 125 slices shown]
[im 25/125  lung]
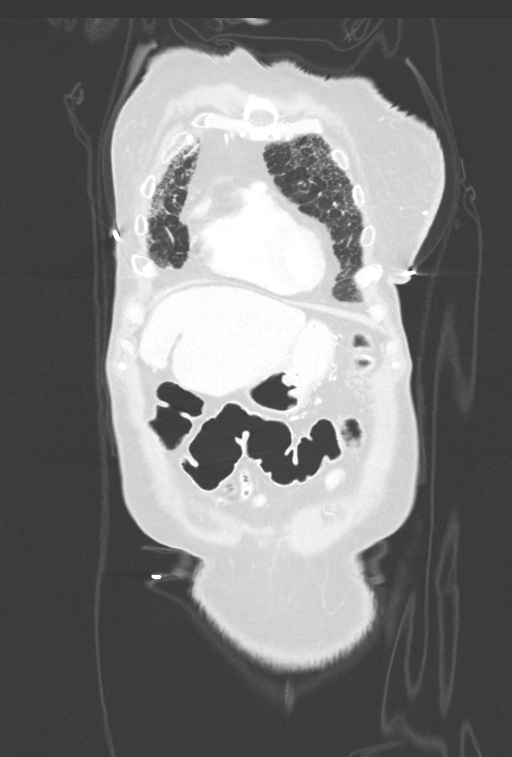
[im 50/125  lung]
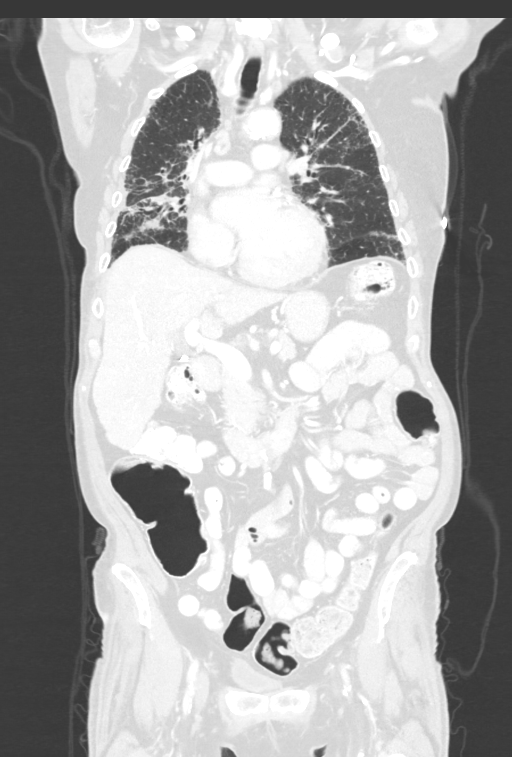
[im 75/125  lung]
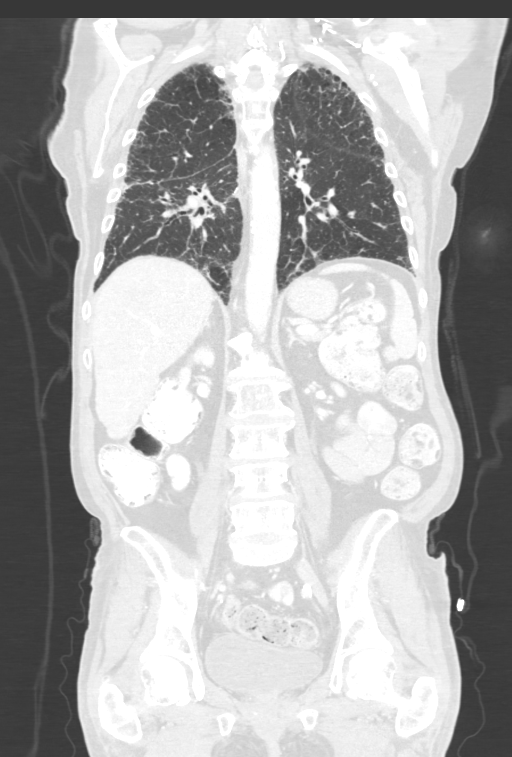

[13 of 36 positions shown; findings below may reference images not displayed]

FINDINGS: CT CHEST FINDINGS

Cardiovascular: Normal heart size. No pericardial effusion. Aortic
atherosclerosis with coronary artery calcifications.

Mediastinum/Nodes: Normal appearance of the thyroid gland. The
trachea appears patent and is midline. Normal appearance of the
esophagus. No enlarged mediastinal or hilar lymph nodes. No axillary
or supraclavicular adenopathy.

Lungs/Pleura: Mild changes of paraseptal and centrilobular
emphysema. No pleural effusion identified. No acute airspace
consolidation. Changes of pulmonary fibrosis are again noted
including diffuse peripheral predominant interstitial reticulation,
septal thickening, and traction bronchiectasis.

The treated lesion within the right middle lobe measures 1.8 x
cm on today's study, image 70/4. On the previous exam this area was
measured at 2.7 x 2.0 cm. Stable soft tissue thickening surrounding
the adjacent hilar structures.

Musculoskeletal: No chest wall mass or suspicious bone lesions
identified.

CT ABDOMEN PELVIS FINDINGS

Hepatobiliary: Nodular contour the liver is again noted suggestive
of cirrhosis. Status post cholecystectomy. No bile duct dilatation.

Pancreas: Unremarkable. No pancreatic ductal dilatation or
surrounding inflammatory changes.

Spleen: Normal in size without focal abnormality.

Adrenals/Urinary Tract: Adrenal glands are unremarkable. Kidneys are
normal, without renal calculi, focal lesion, or hydronephrosis.
Bladder is unremarkable.

Stomach/Bowel: Stomach is within normal limits. Appendix appears
normal. No evidence of bowel wall thickening, distention, or
inflammatory changes.

Vascular/Lymphatic: Aortic atherosclerosis. No aneurysm. No
abdominopelvic adenopathy identified.

Reproductive: Status post hysterectomy. No adnexal masses.

Other: No abdominal wall hernia or abnormality. No abdominopelvic
ascites.

Musculoskeletal: No acute or significant osseous findings.
IMPRESSION: 1. Continued decrease in size of treated lesion within the right
middle lobe. No new or progressive disease identified.
2. Morphologic features of the liver suggestive of cirrhosis.
3. Chronic interstitial lung disease is identified compatible with
pulmonary fibrosis. More definitive characterization could be
obtained with high-resolution CT of the chest if clinically
indicated.
4. Coronary artery atherosclerotic calcifications.
5. Aortic Atherosclerosis ([id number]) and Emphysema ([id number]).

## 2022-07-26 IMAGING — MR MR HEAD WO/W CM
15 series · 48 of 48 positions shown · IV contrast (gadavist)
Comparison: Head MRI 08/08/2021

CLINICAL DATA: Brain/CNS neoplasm, staging. Small-cell lung cancer.
History of whole brain radiation therapy in 7577.

EXAM:
MRI HEAD WITHOUT AND WITH CONTRAST
TECHNIQUE: Multiplanar, multiecho pulse sequences of the brain and surrounding
structures were obtained without and with intravenous contrast.
CONTRAST:  6mL GADAVIST GADOBUTROL 1 MMOL/ML IV SOLN

[Series 5: DWI · axial · 3.0mm · 1.36mm/px · z∈[-35,+102]mm · 6 of 96 slices shown (1 of 2)]
[im 1/96]
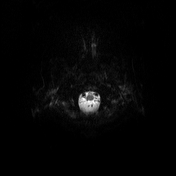
[im 20/96]
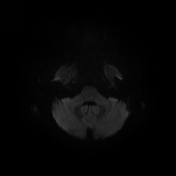
[im 39/96]
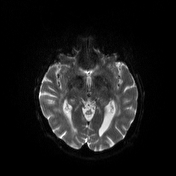
[im 58/96]
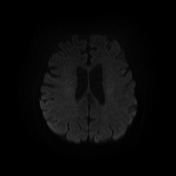
[im 77/96]
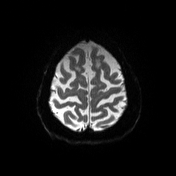
[im 96/96]
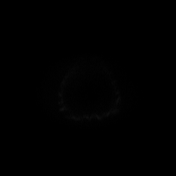

[Series 6: DWI · axial · 3.0mm · 1.36mm/px · z∈[-35,+102]mm · 3 of 48 slices shown (2 of 2)]
[im 1/48]
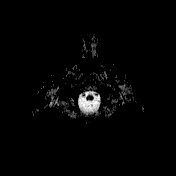
[im 24/48]
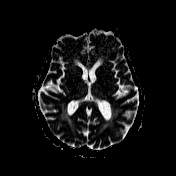
[im 48/48]
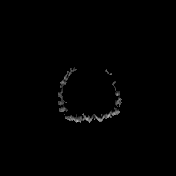

[Series 7: T1 · sagittal · 5.0mm · 0.75mm/px · 1 of 24 slices shown (1 of 4)]
[im 1/24]
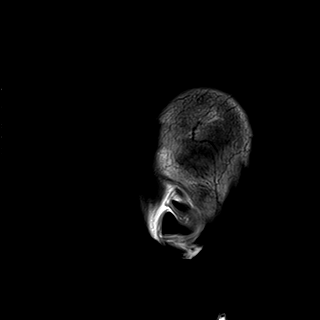

[Series 8: T2 · axial · 5.0mm · 0.62mm/px · z∈[-46,+111]mm · 2 of 26 slices shown]
[im 1/26]
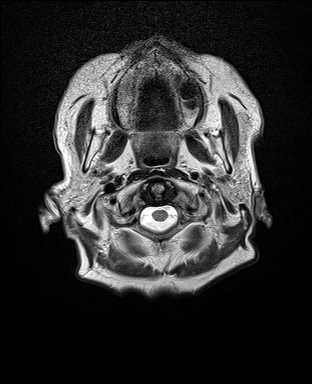
[im 26/26]
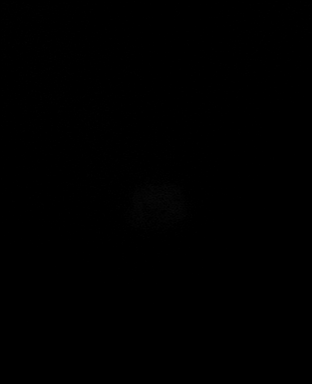

[Series 9: swi_images · axial · 3.0mm · 0.75mm/px · z∈[-53,+118]mm · 3 of 60 slices shown]
[im 1/60]
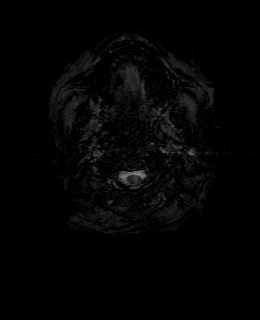
[im 30/60]
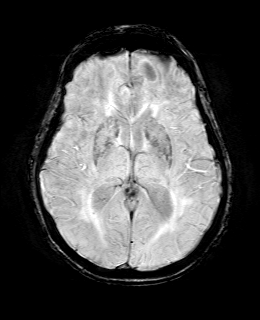
[im 60/60]
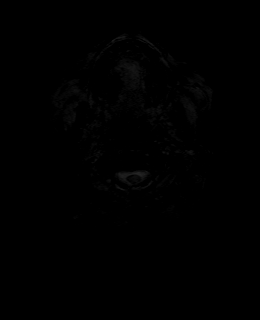

[Series 11: FLAIR · axial · 3.0mm · 0.75mm/px · z∈[-42,+106]mm · 3 of 52 slices shown]
[im 1/52]
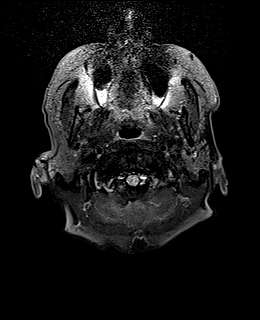
[im 26/52]
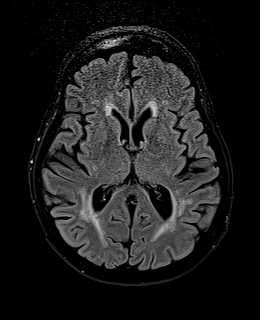
[im 52/52]
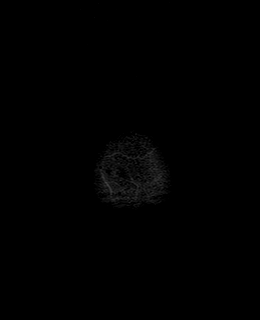

[Series 12: T1 · axial · 1.0mm · 0.94mm/px · z∈[-37,+102]mm · 8 of 144 slices shown (2 of 4)]
[im 1/144]
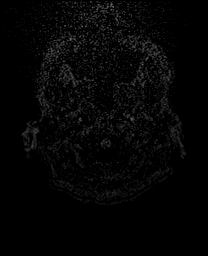
[im 21/144]
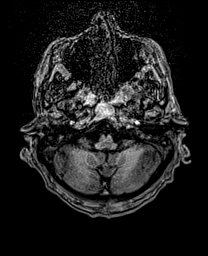
[im 41/144]
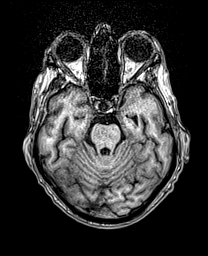
[im 62/144]
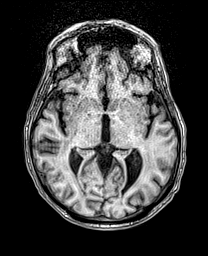
[im 82/144]
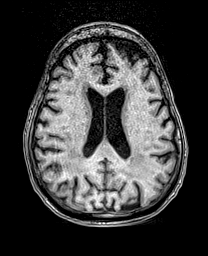
[im 103/144]
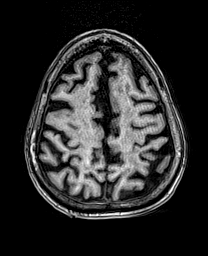
[im 123/144]
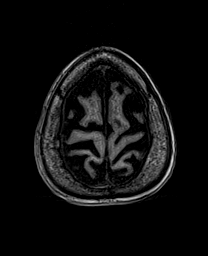
[im 144/144]
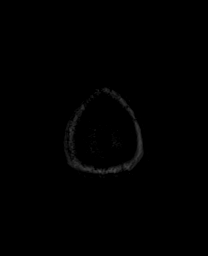

[Series 13: cor dwi_tracew · coronal · 5.0mm · 1.53mm/px · 3 of 56 slices shown]
[im 1/56]
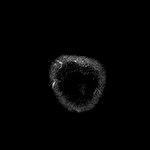
[im 28/56]
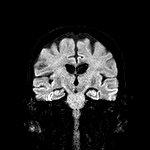
[im 56/56]
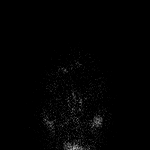

[Series 14: cor dwi_adc · coronal · 5.0mm · 1.53mm/px · 2 of 27 slices shown]
[im 1/27]
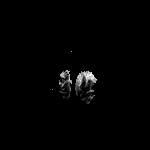
[im 27/27]
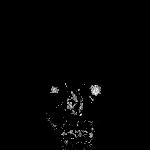

[Series 15: T2 post-contrast · coronal · 5.0mm · 0.57mm/px · 2 of 28 slices shown]
[im 1/28]
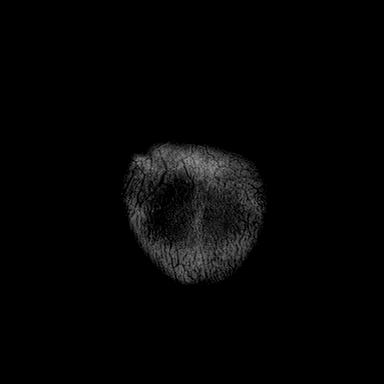
[im 28/28]
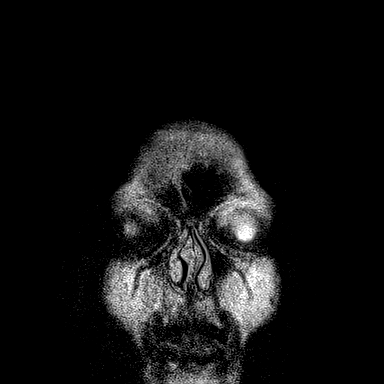

[Series 16: T1 post-contrast · axial · 1.0mm · 0.94mm/px · z∈[-37,+102]mm · 8 of 144 slices shown (1 of 3)]
[im 1/144]
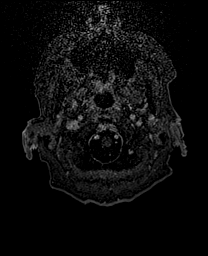
[im 21/144]
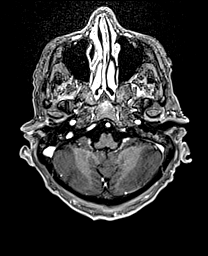
[im 41/144]
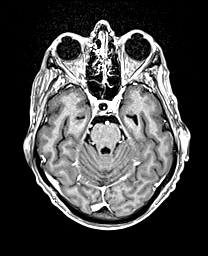
[im 62/144]
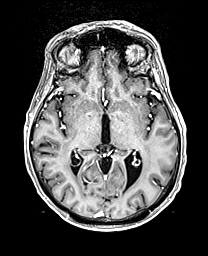
[im 82/144]
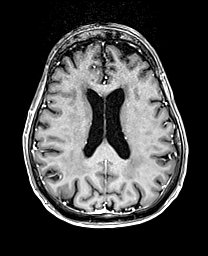
[im 103/144]
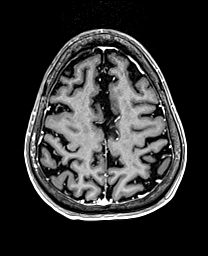
[im 123/144]
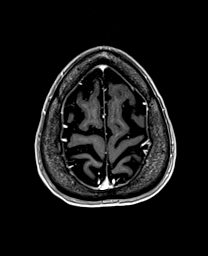
[im 144/144]
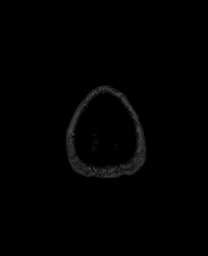

[Series 17: T1 · sagittal · 4.0mm · 0.94mm/px · 2 of 34 slices shown (3 of 4)]
[im 1/34]
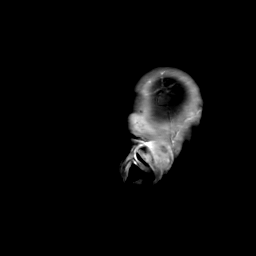
[im 34/34]
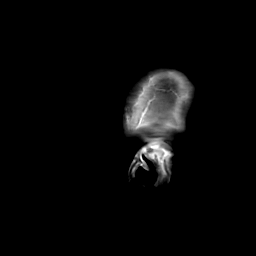

[Series 18: T1 · coronal · 4.0mm · 0.94mm/px · 2 of 41 slices shown (4 of 4)]
[im 1/41]
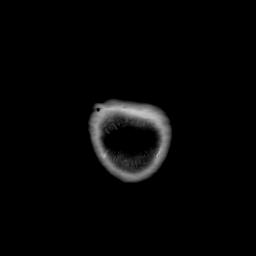
[im 41/41]
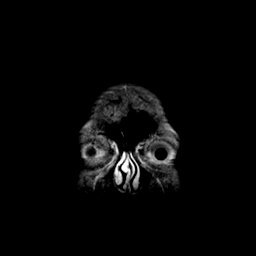

[Series 19: T1 post-contrast · coronal · 5.0mm · 0.43mm/px · 2 of 28 slices shown (2 of 3)]
[im 1/28]
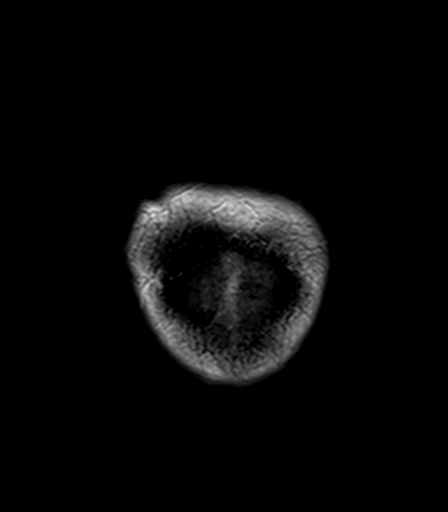
[im 28/28]
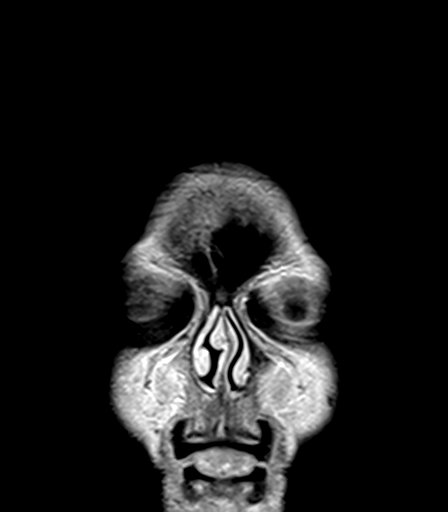

[Series 20: T1 post-contrast · sagittal · 5.0mm · 0.75mm/px · 1 of 24 slices shown (3 of 3)]
[im 1/24]
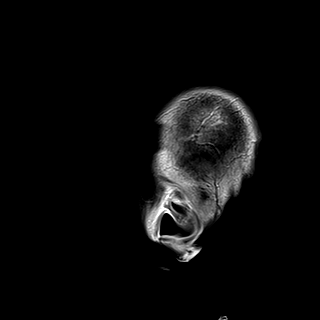

[48 of 48 positions shown; findings below may reference images not displayed]

FINDINGS: Brain: There is no evidence an acute infarct, midline shift, or
extra-axial fluid collection. A chronic microhemorrhage in the
cerebellar vermis is unchanged. There is mild cerebral atrophy.
Patchy and confluent T2 hyperintensities in the cerebral white
matter and pons have progressed and are nonspecific but likely
reflect progressive post radiation changes superimposed on chronic
small vessel ischemia.

A 6 mm enhancing lesion peripherally in the left cerebellar
hemisphere is unchanged. No new enhancing brain lesion is
identified.

Vascular: Major intracranial vascular flow voids are preserved.

Skull and upper cervical spine: Unremarkable bone marrow signal.

Sinuses/Orbits: Bilateral cataract extraction. Clear paranasal
sinuses. Small to moderate bilateral mastoid effusions.

Other: None.
IMPRESSION: 1. Unchanged 6 mm left cerebellar metastasis.
2. No evidence of new intracranial metastases.
3. Progressive post radiation changes in the cerebral white matter.

## 2022-08-28 ENCOUNTER — Ambulatory Visit: Payer: Medicare HMO | Admitting: Nutrition

## 2022-09-04 IMAGING — DX DG KNEE COMPLETE 4+V*R*
4 series · 4 of 4 positions shown · non-contrast
Comparison: None.

CLINICAL DATA: Pain and swelling after fall.

EXAM:
RIGHT KNEE - COMPLETE 4+ VIEW

[knee ap]
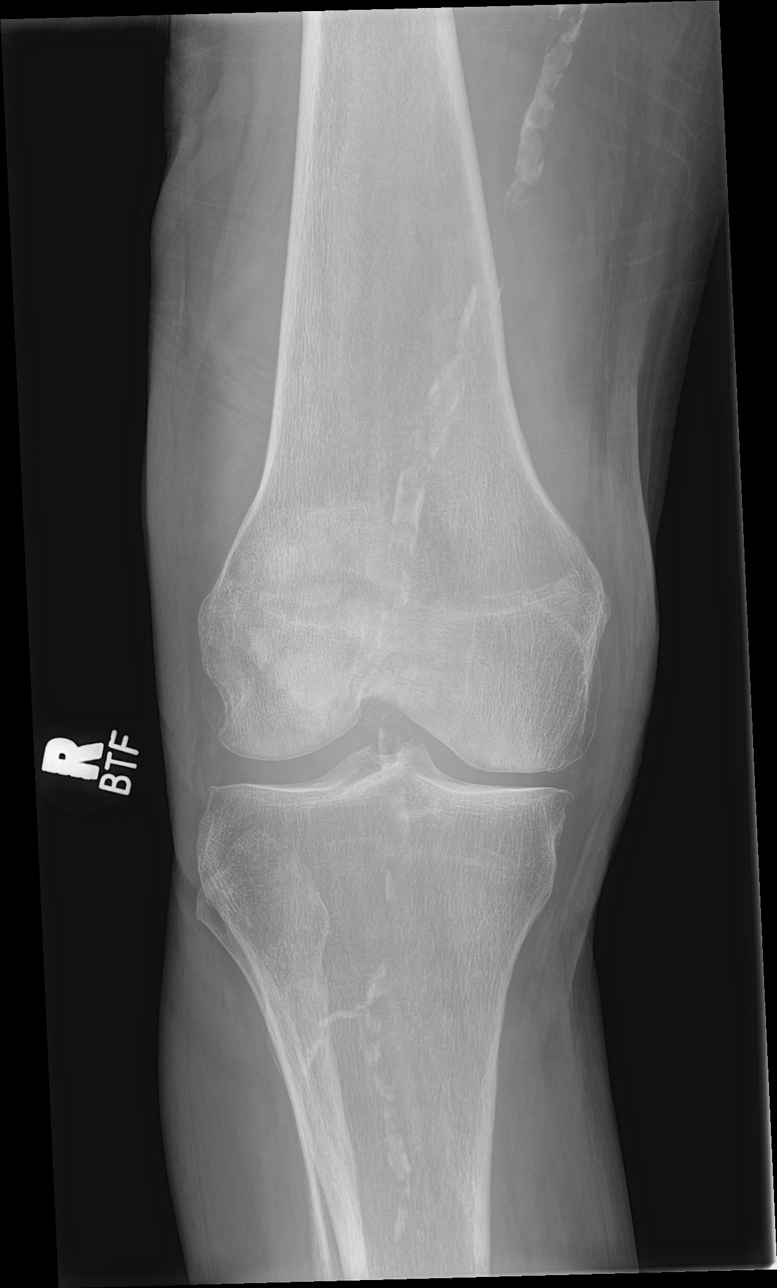

[knee obl (1 of 2)]
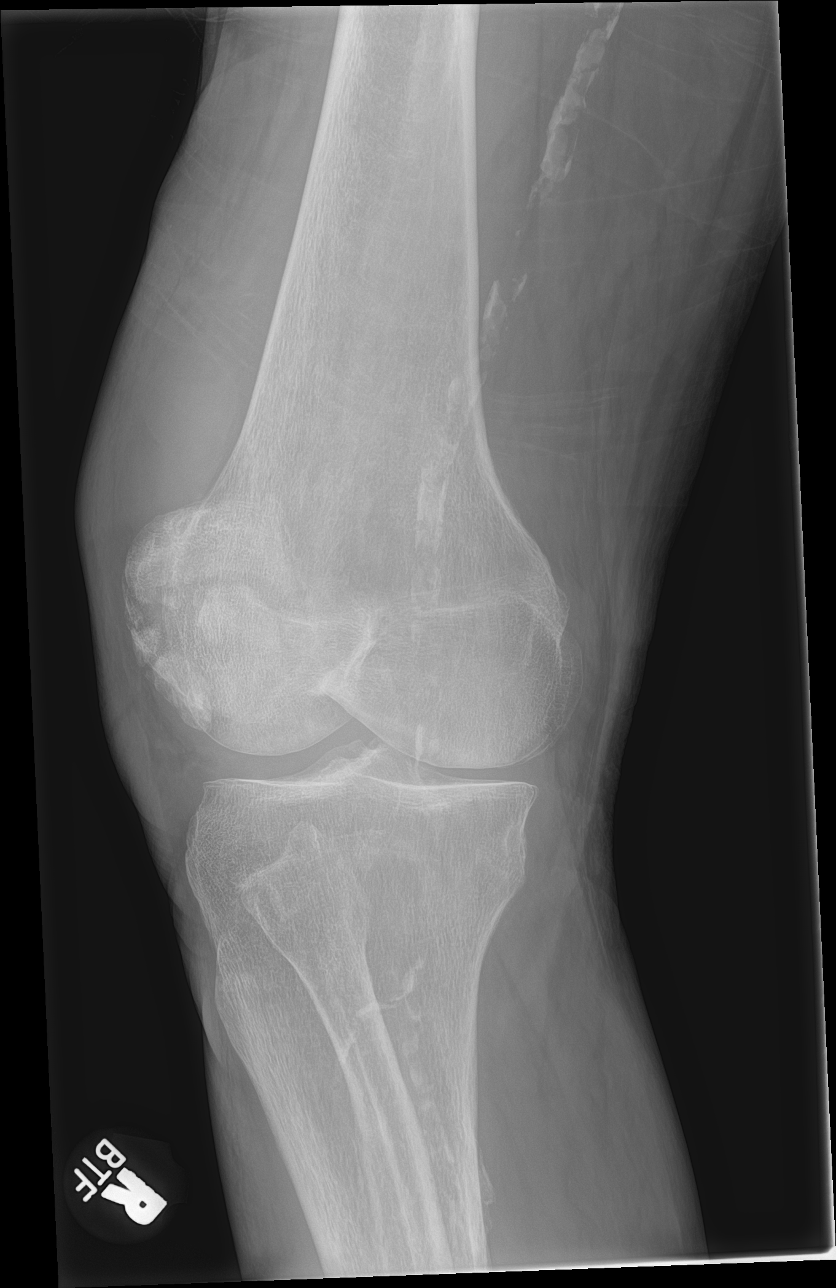

[knee obl (2 of 2)]
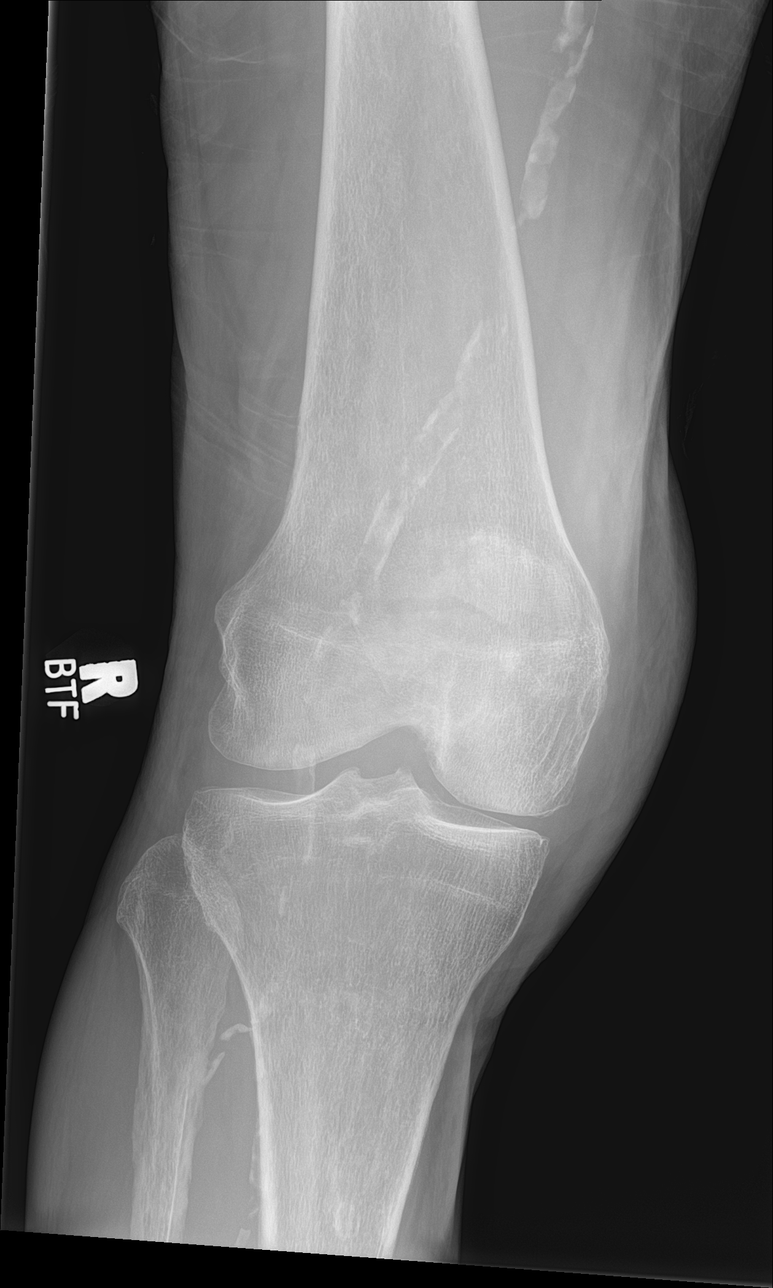

[knee lat]
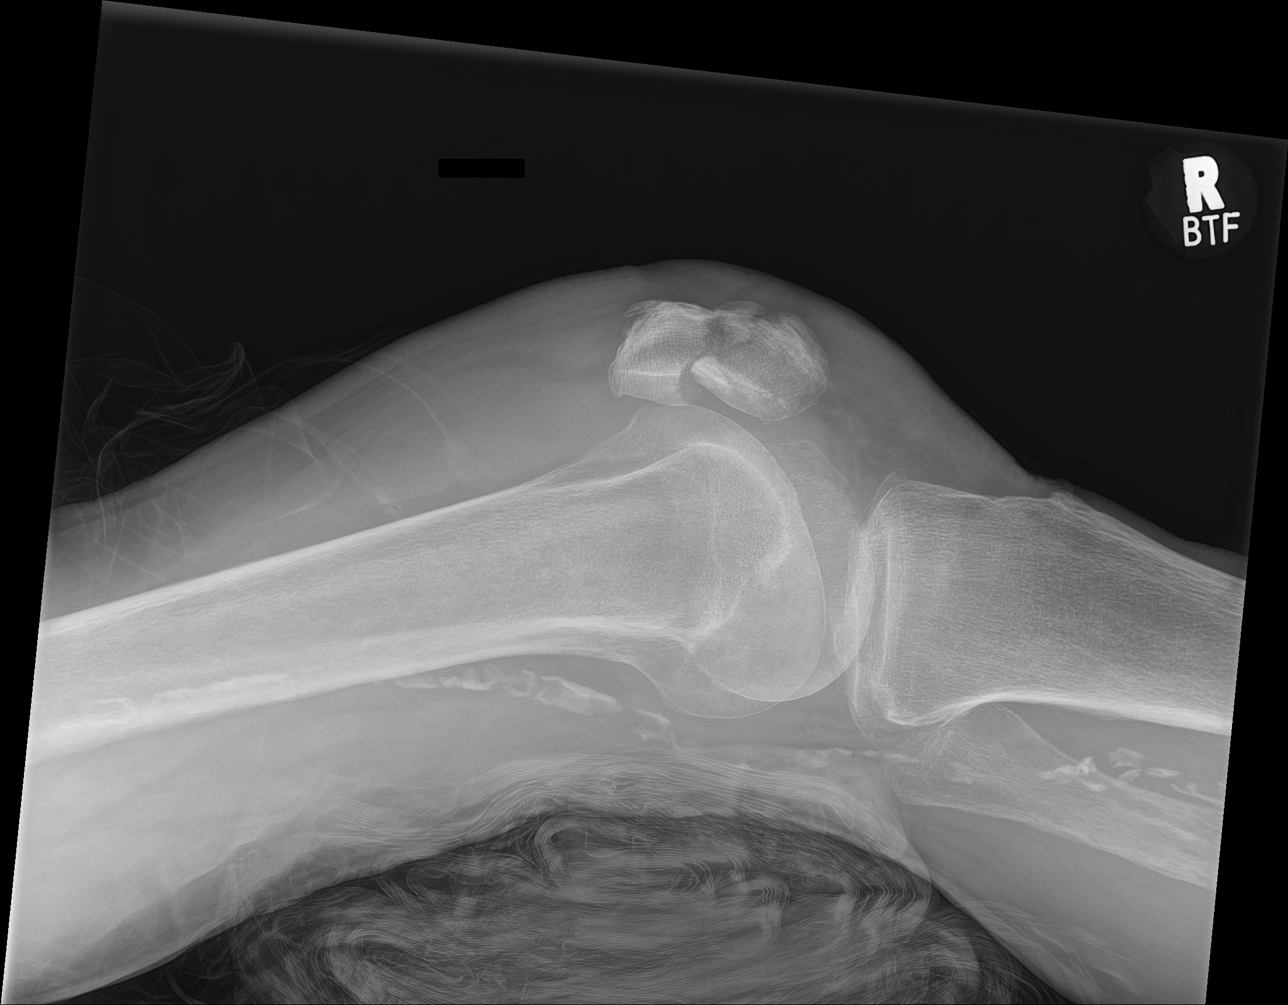

[4 of 4 positions shown; findings below may reference images not displayed]

FINDINGS: There is an acute mildly comminuted fracture through the mid
patella. Superior pole is displaced 6 mm posteriorly. There is
overlying soft tissue swelling and large joint effusion with
lipohemarthrosis. There is no evidence for dislocation. There is
mild medial compartment joint space narrowing compatible with
degenerative change. There are vascular calcifications in the soft
tissues.
IMPRESSION: 1. Mildly displaced comminuted mid patellar fracture.
2. Large Lipohemarthrosis.

## 2022-09-04 IMAGING — CT CT HEAD W/O CM
3 of 4 series · 15 of 47 positions shown, 18 images · non-contrast
Comparison: Brain MRI 11/10/2021, head CT 02/28/2021

CLINICAL DATA: Head trauma, minor (Age >= 65y)

Fall backwards striking back of head well at home yesterday.
EXAM:
CT HEAD WITHOUT CONTRAST
TECHNIQUE: Contiguous axial images were obtained from the base of the skull
through the vertex without intravenous contrast.
RADIATION DOSE REDUCTION: This exam was performed according to the
departmental dose-optimization program which includes automated
exposure control, adjustment of the mA and/or kV according to
patient size and/or use of iterative reconstruction technique.

[Series 2: head w o · axial · 0.43mm/px · z∈[+7,+137]mm · 9 of 32 slices shown, 12 images]
[im 3/32  brain]
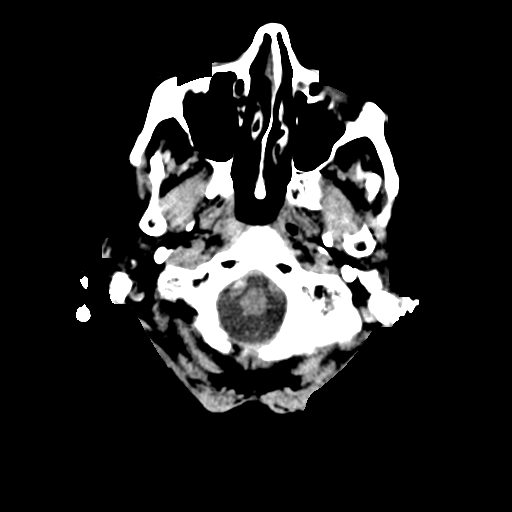
[im 3/32  bone]
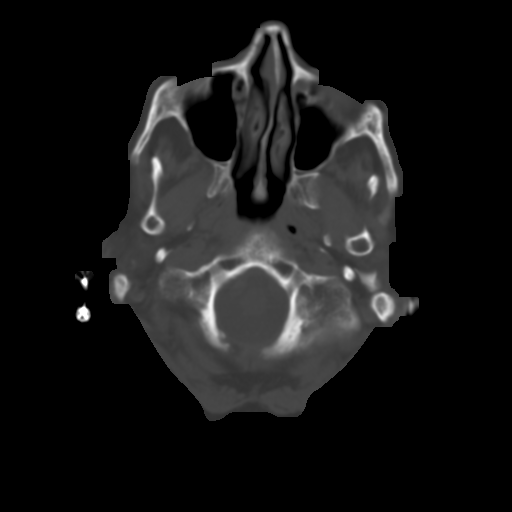
[im 7/32  brain]
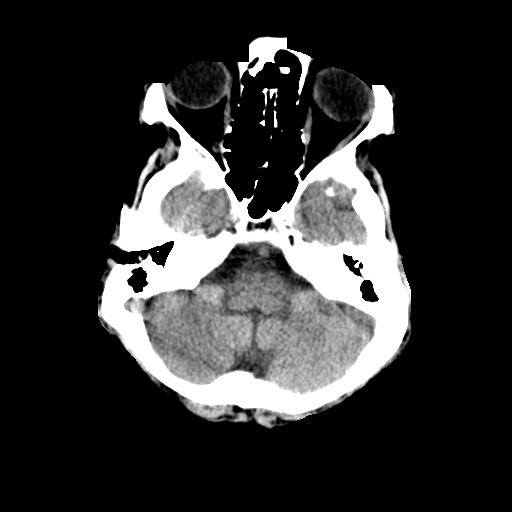
[im 9/32  brain]
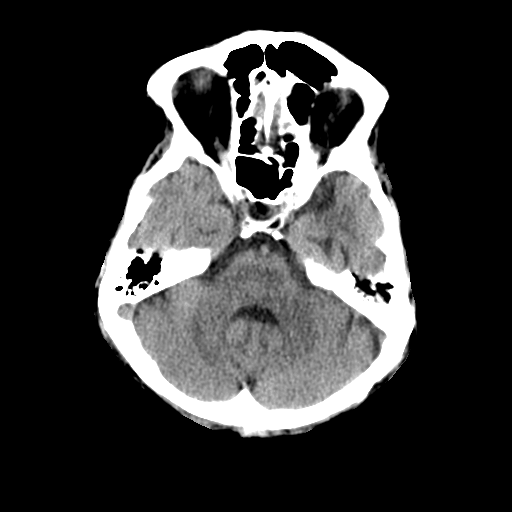
[im 14/32  brain]
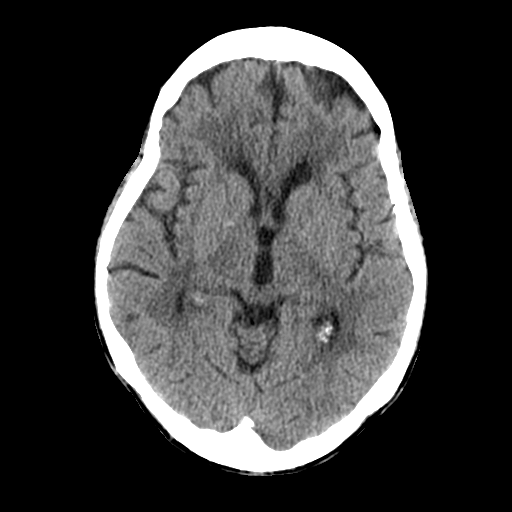
[im 16/32  brain]
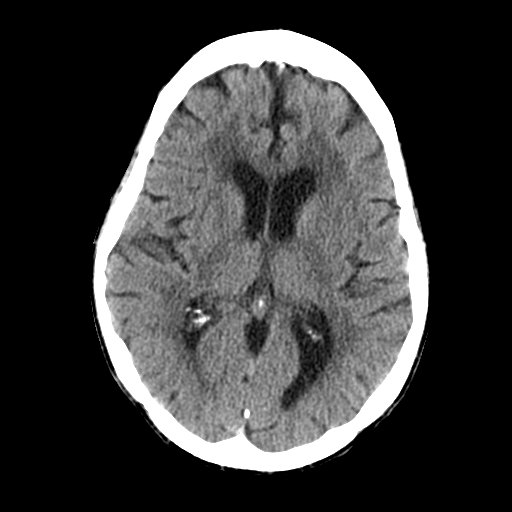
[im 16/32  bone]
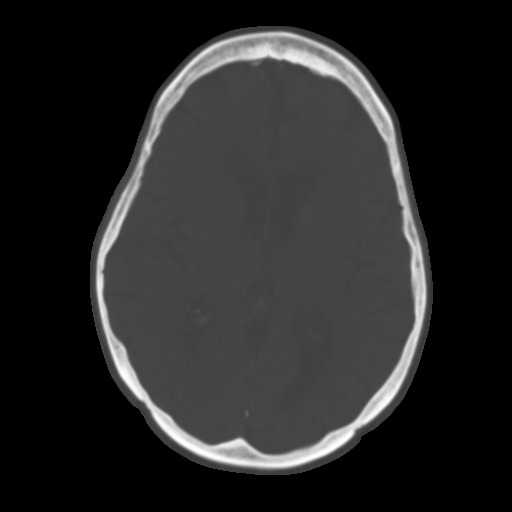
[im 18/32  brain]
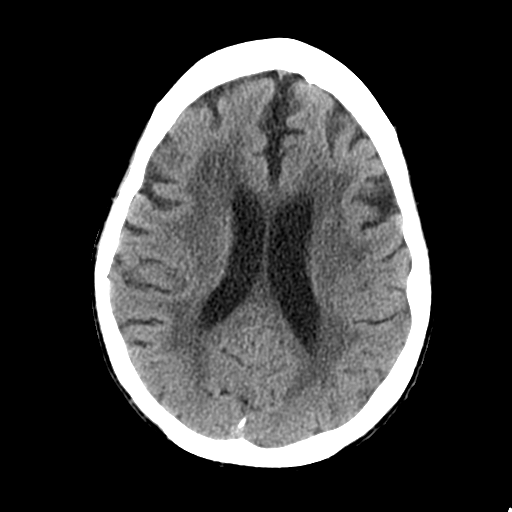
[im 23/32  brain]
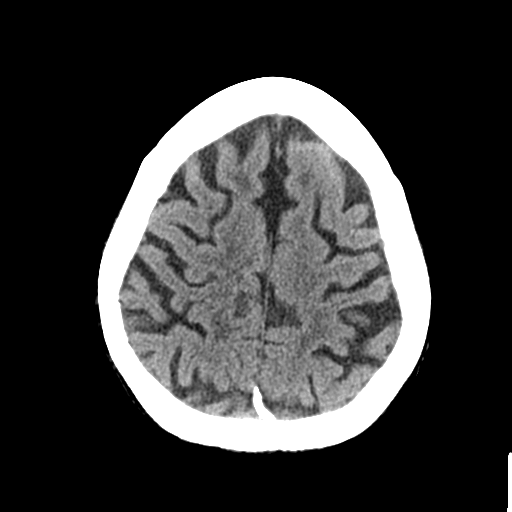
[im 25/32  brain]
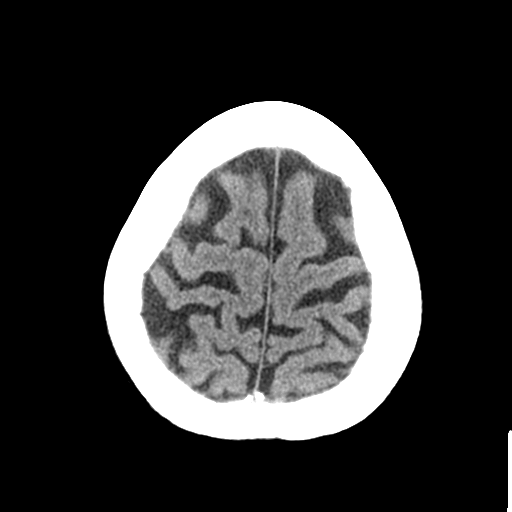
[im 29/32  brain]
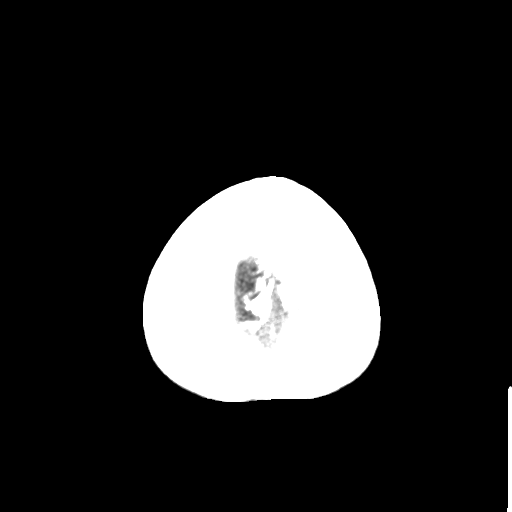
[im 29/32  bone]
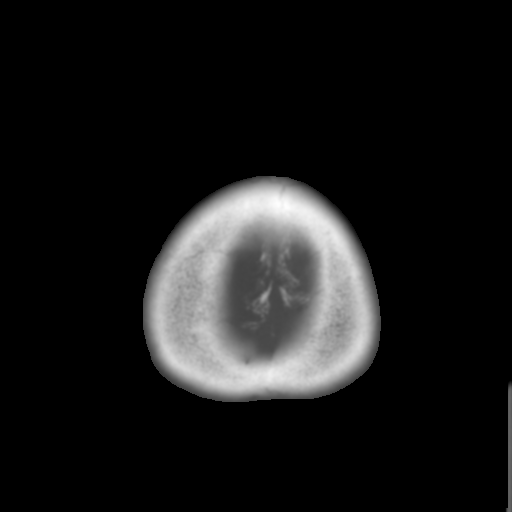

[Series 4: coronal soft · coronal · 0.32mm/px · 3 of 67 slices shown]
[im 23/67  brain]
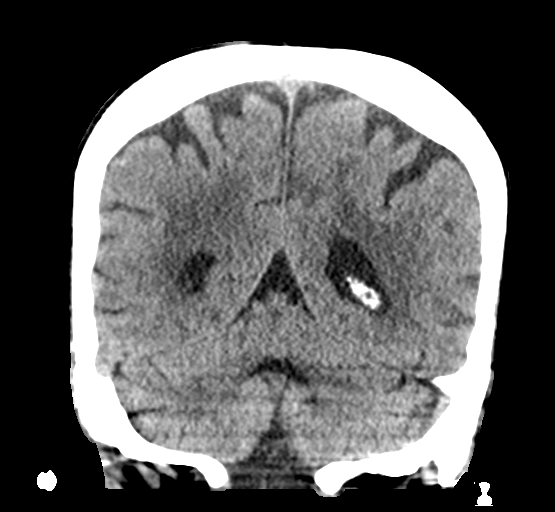
[im 30/67  brain]
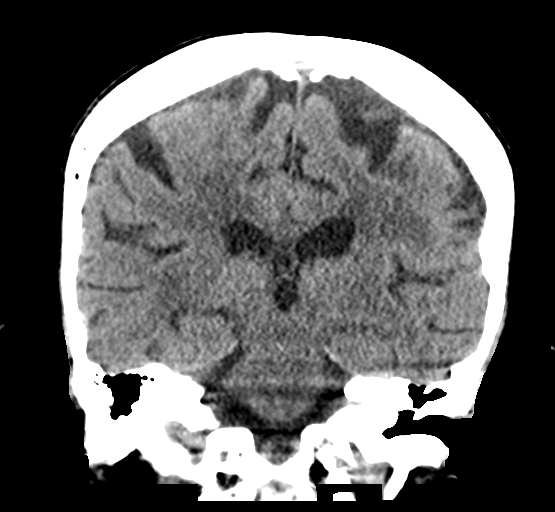
[im 37/67  brain]
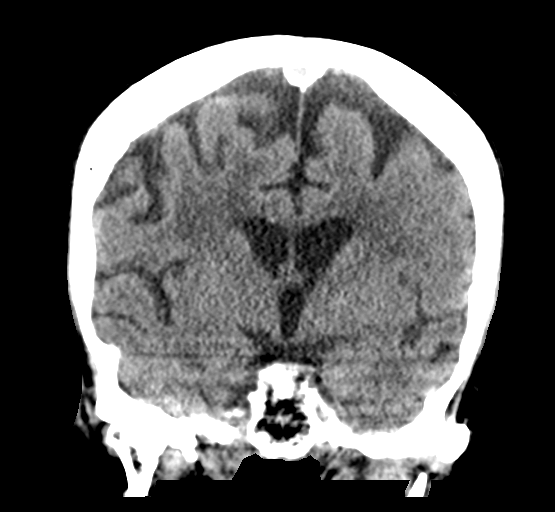

[Series 5: sagittal soft · sagittal · 0.36mm/px · 3 of 50 slices shown]
[im 17/50  brain]
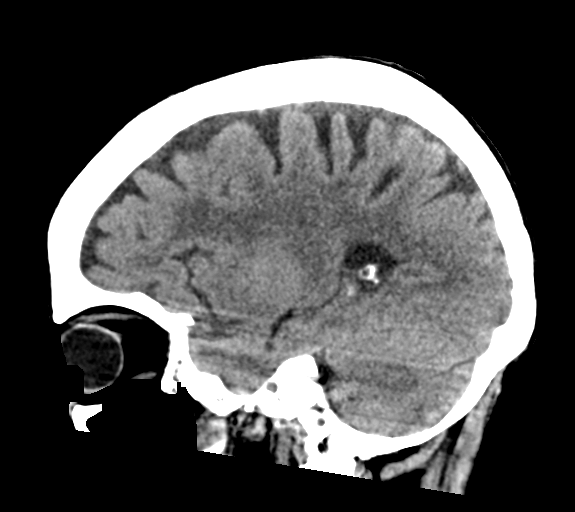
[im 25/50  brain]
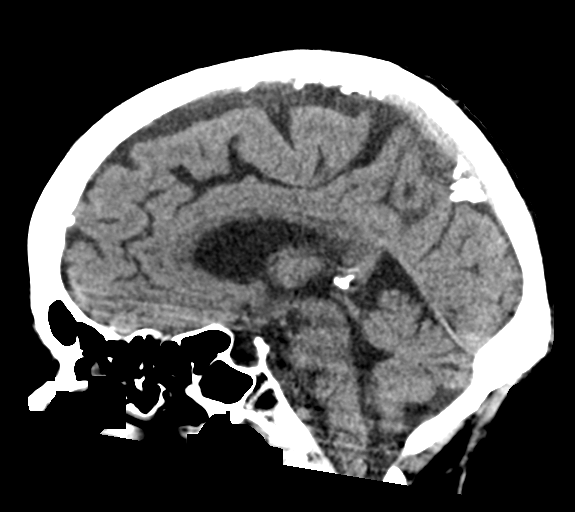
[im 33/50  brain]
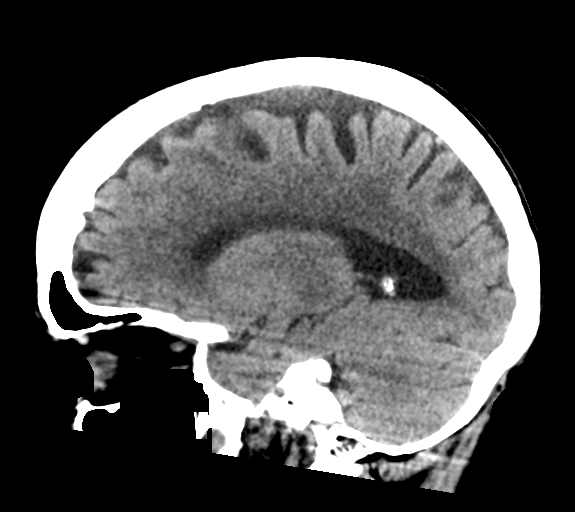

[15 of 47 positions shown; findings below may reference images not displayed]

FINDINGS: Brain: No acute intracranial hemorrhage. Small focus of low-density
in the left cerebellum corresponds to prior metastasis on MRI.
Normal brain volume for age, no hydrocephalus. Periventricular white
matter hypodensities with equivocal progression from prior CT. No
subdural or extra-axial collection. No midline shift.

Vascular: Hyperdense vessel.

Skull: No fracture or focal lesion.

Sinuses/Orbits: Paranasal sinuses and mastoid air cells are clear.
The visualized orbits are unremarkable.

Other: There is no confluent scalp hematoma.
IMPRESSION: 1. No acute intracranial abnormality. No skull fracture.
2. Small focus of low-density in the left cerebellum corresponds to
metastasis on MRI.
3. Periventricular white matter hypodensities typical of chronic
small vessel ischemia with equivocal progression from prior CT.

## 2022-09-10 IMAGING — CR DG KNEE 1-2V PORT*R*
2 series · 2 of 2 positions shown · non-contrast
Comparison: 12/20/2021

CLINICAL DATA: Fracture patella

EXAM:
PORTABLE RIGHT KNEE - 1-2 VIEW

[xtable lateral]
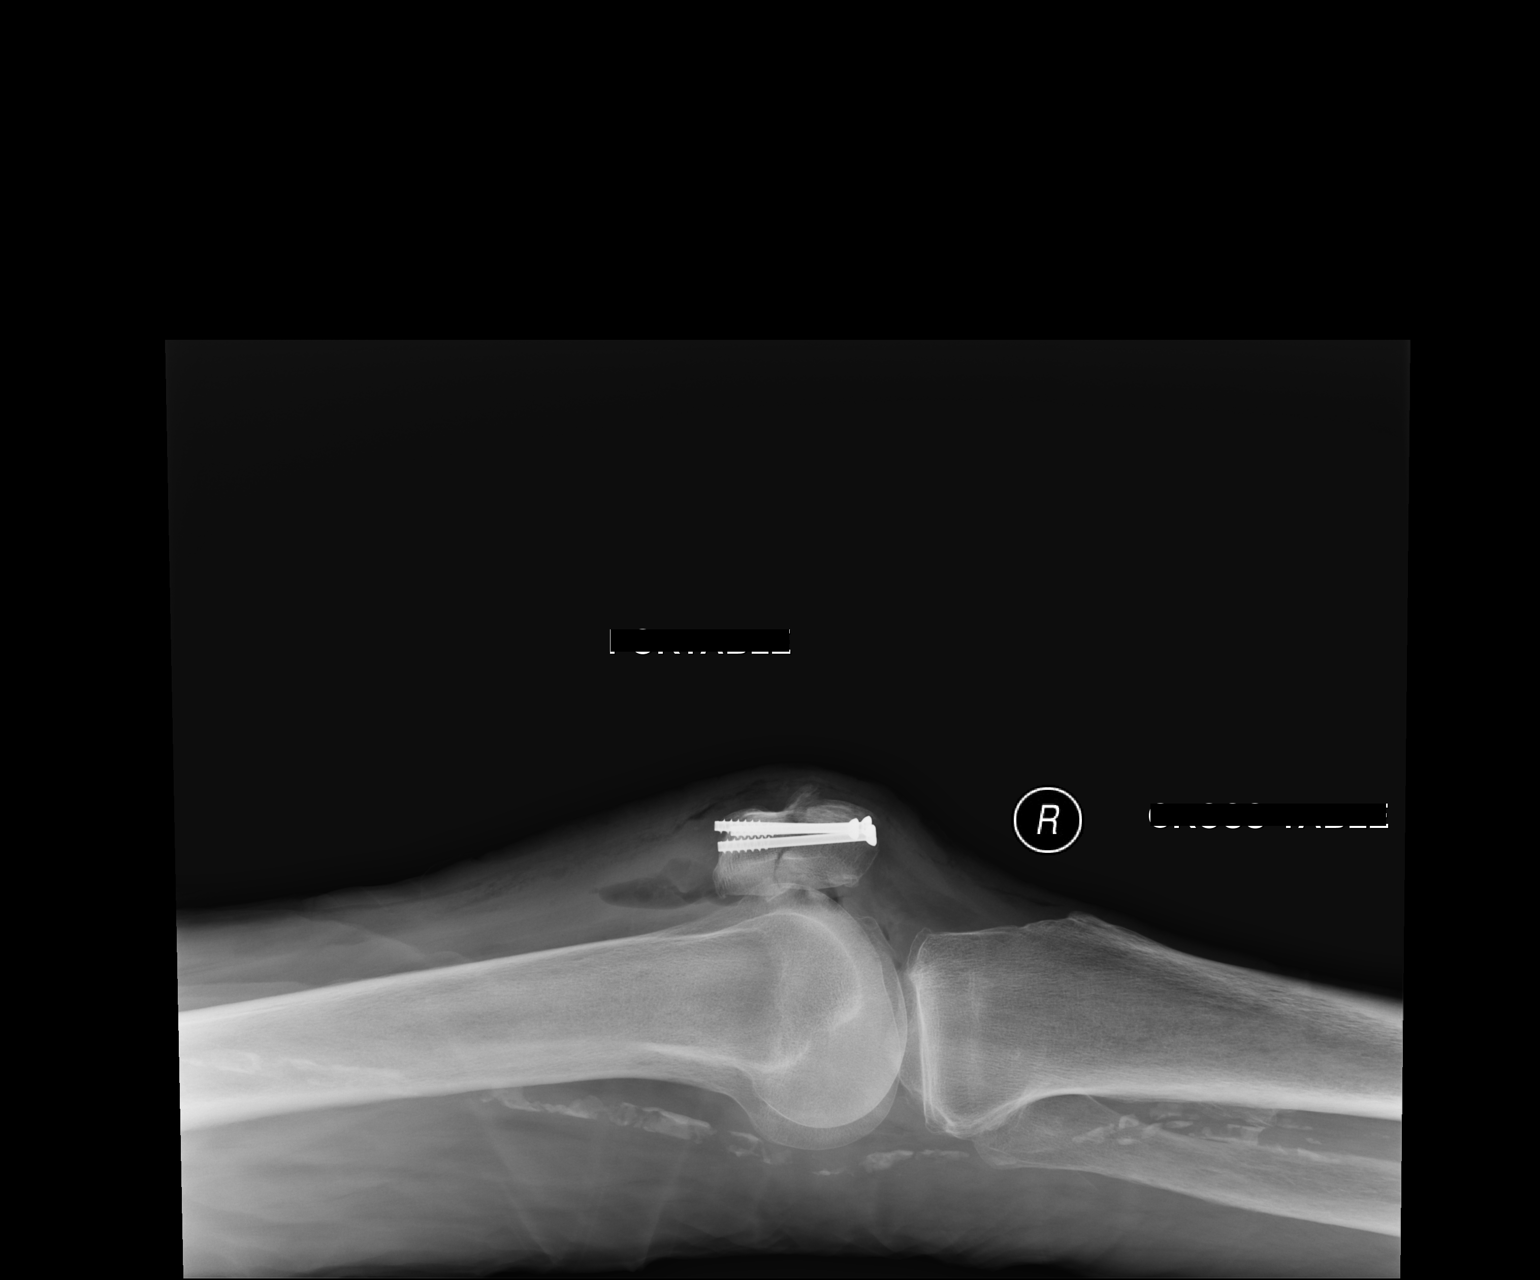

[AP]
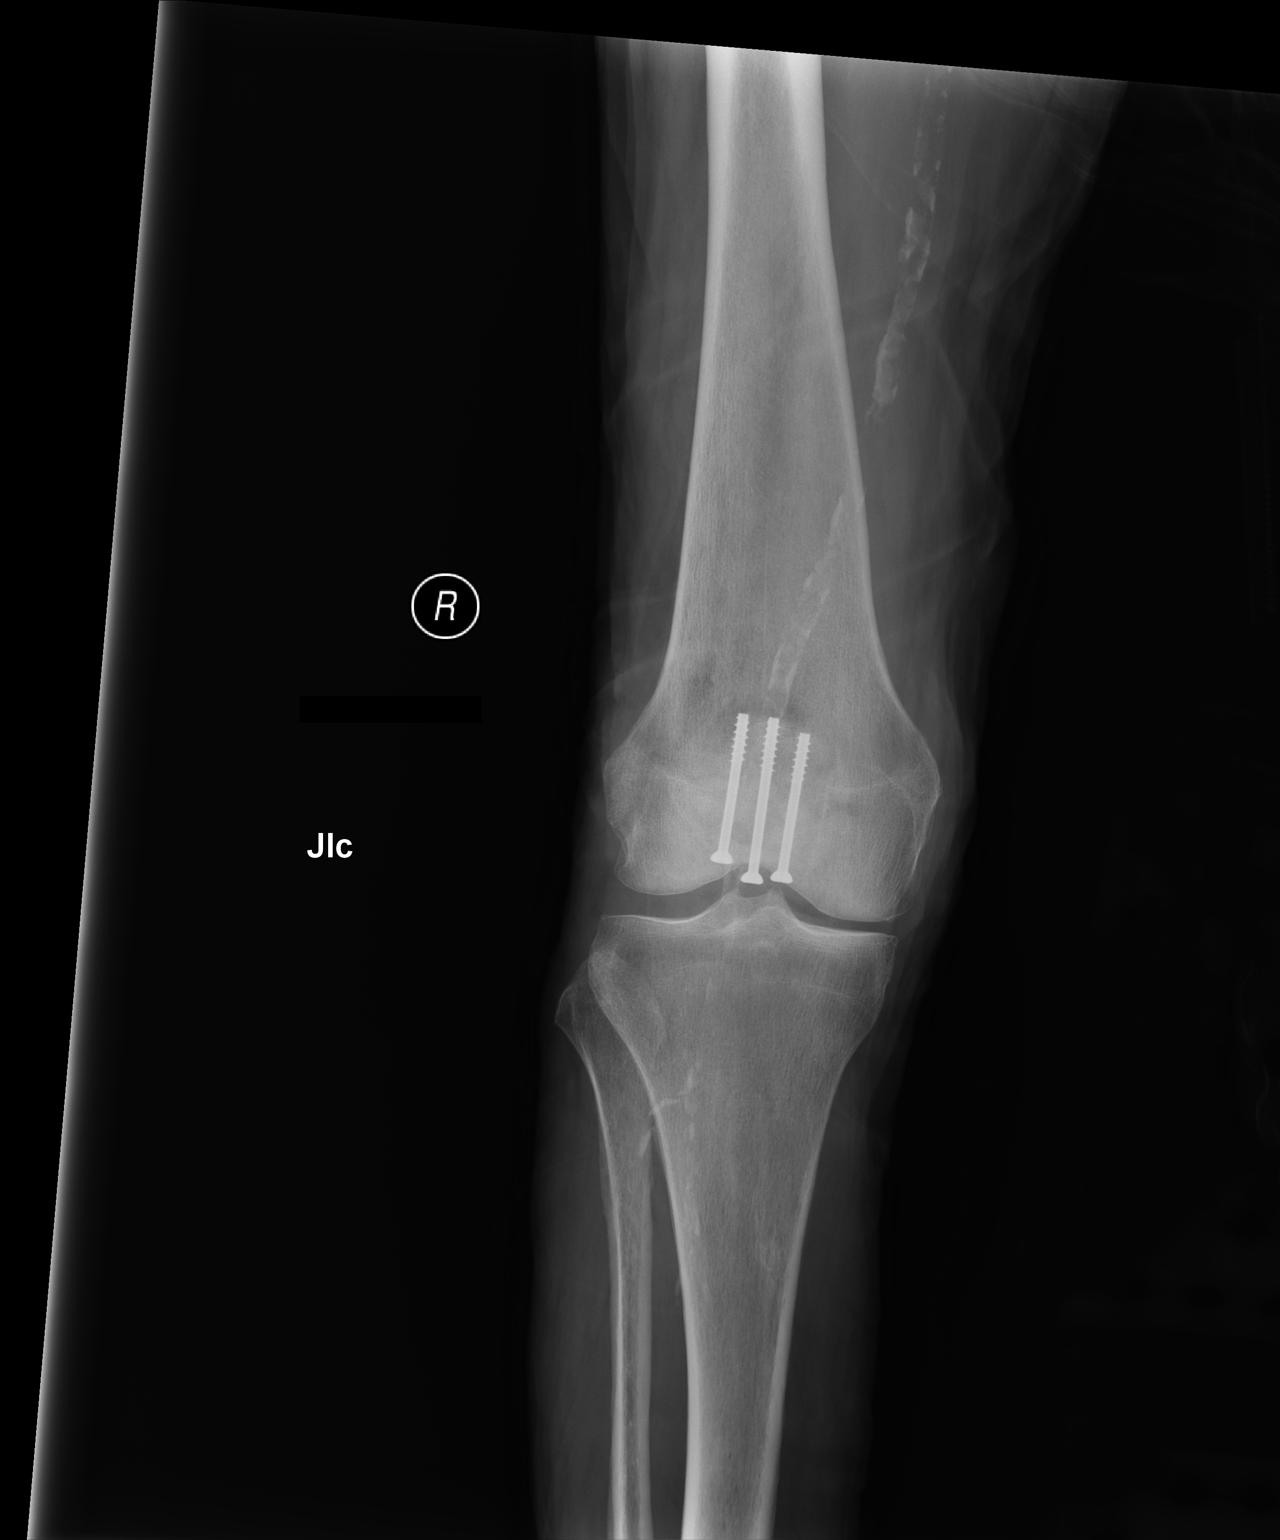

[2 of 2 positions shown; findings below may reference images not displayed]

FINDINGS: There is interval internal fixation of comminuted fracture of
patella with 3 surgical screws. There is improvement in alignment of
fracture fragments. There are pockets of air in the soft tissues.
Arterial calcifications are seen in the soft tissues.
IMPRESSION: There is interval reduction and internal fixation of comminuted
fracture of right patella.

## 2022-09-10 IMAGING — RF DG KNEE 1-2V*R*
1 series · 2 of 2 positions shown · non-contrast
Comparison: Right knee radiographs 12/20/2021

CLINICAL DATA: ORIF of right patella fracture.

EXAM:
RIGHT KNEE - 1-2 VIEW

[Series 1: run · 2 of 2 slices shown]
[im 1/2]
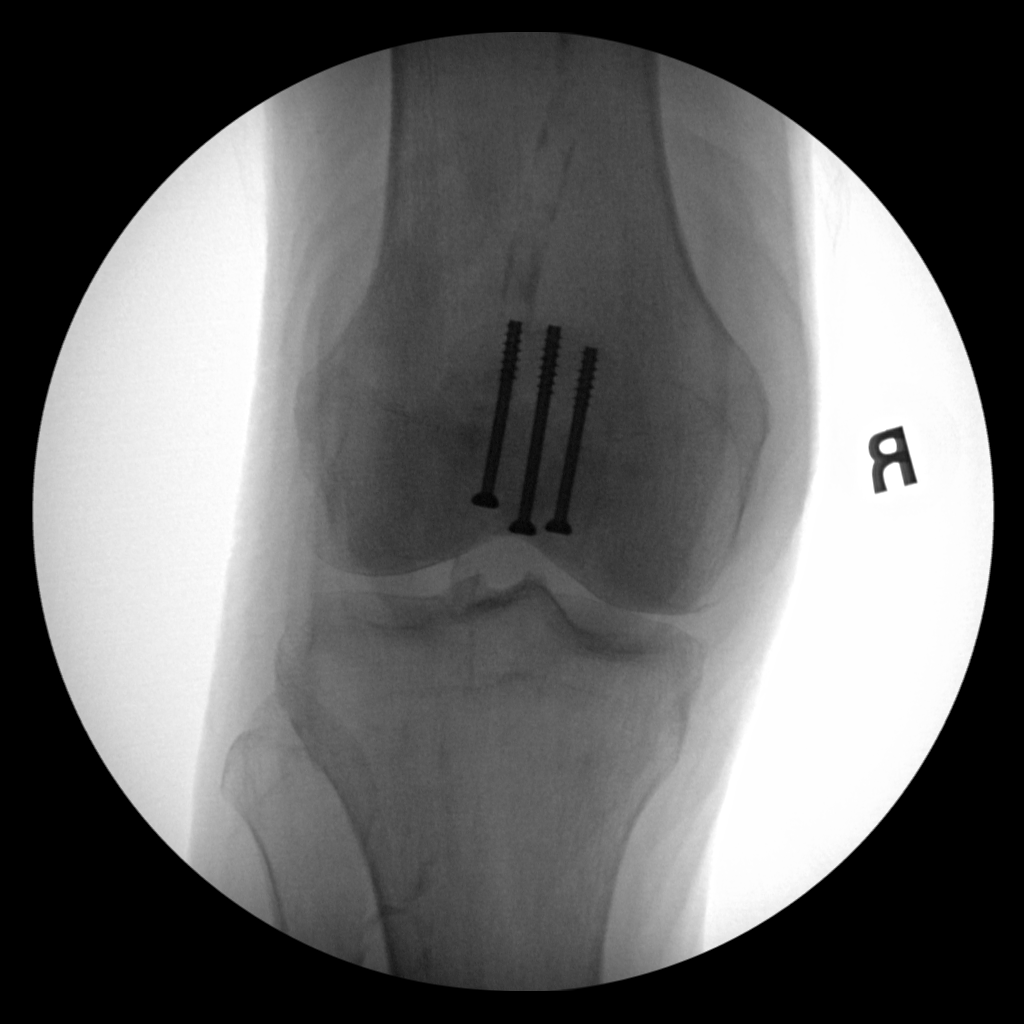
[im 2/2]
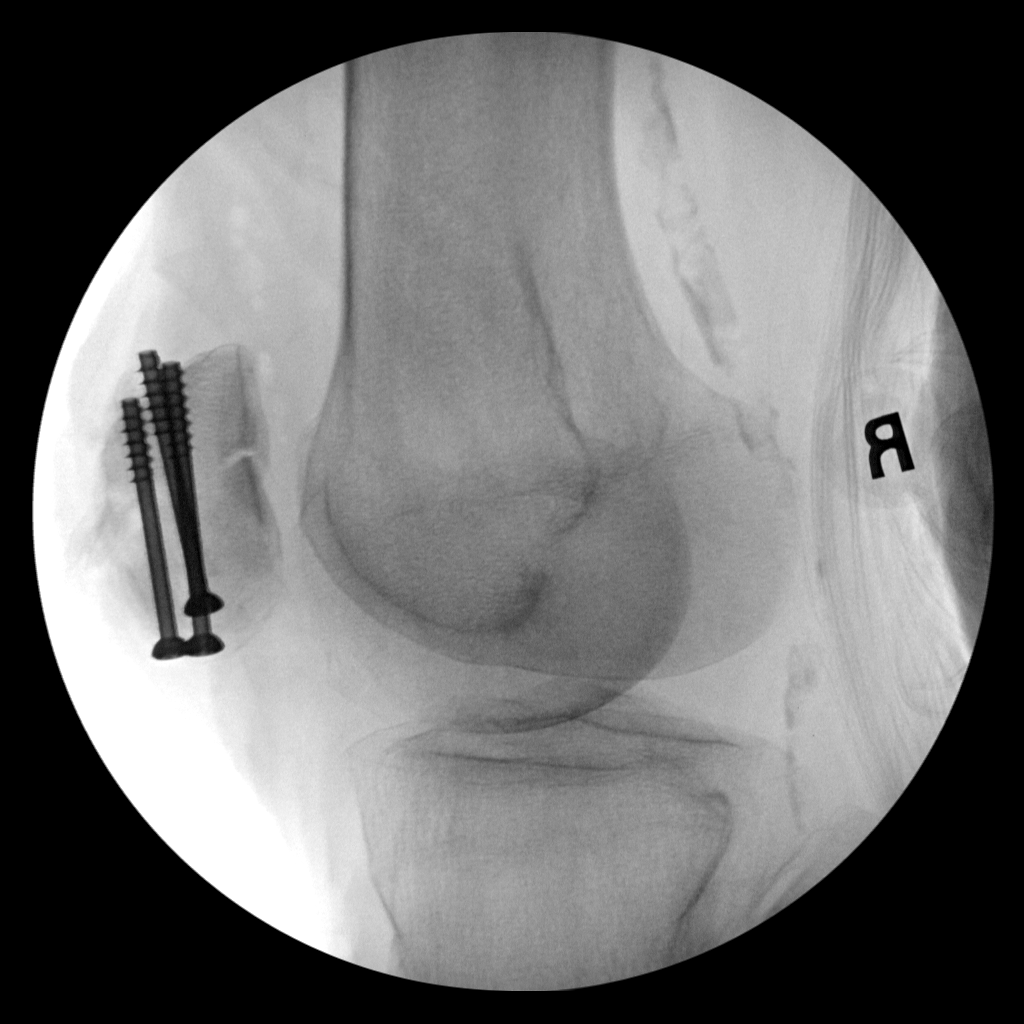

[2 of 2 positions shown; findings below may reference images not displayed]

FLUOROSCOPY TIME:  Fluoroscopy Time: 36 seconds

Radiation Exposure Index: 1.8 mGy
FINDINGS: Two intraoperative fluoroscopic images are provided. There has been
interval ORIF of the previously described mid patella fracture with
placement of 3 screws and improved alignment.
IMPRESSION: Intraoperative images during ORIF of patella fracture.

## 2022-09-12 IMAGING — DX DG KNEE 1-2V*R*
1 series · 2 of 2 positions shown · non-contrast
Comparison: 12/26/2021 and 12/20/2021

CLINICAL DATA: Fracture of patella.

EXAM:
RIGHT KNEE - 1-2 VIEW

[Series 1: knee · 0.14mm/px · 2 of 2 slices shown]
[im 1/2]
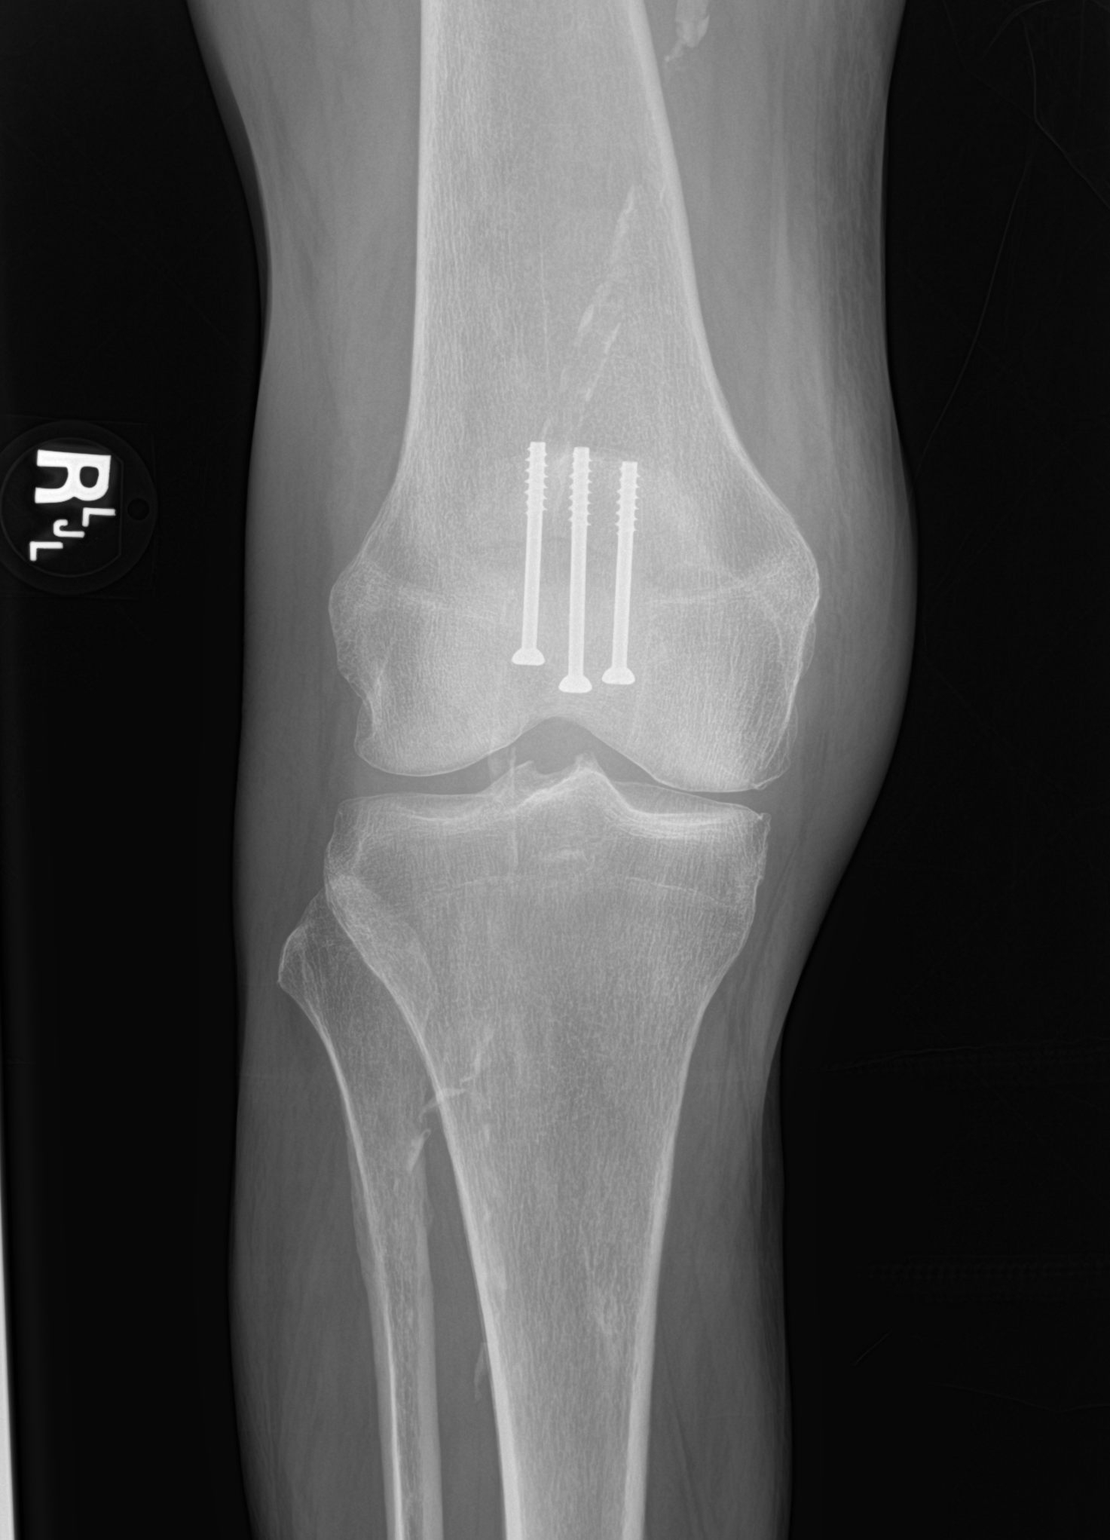
[im 2/2]
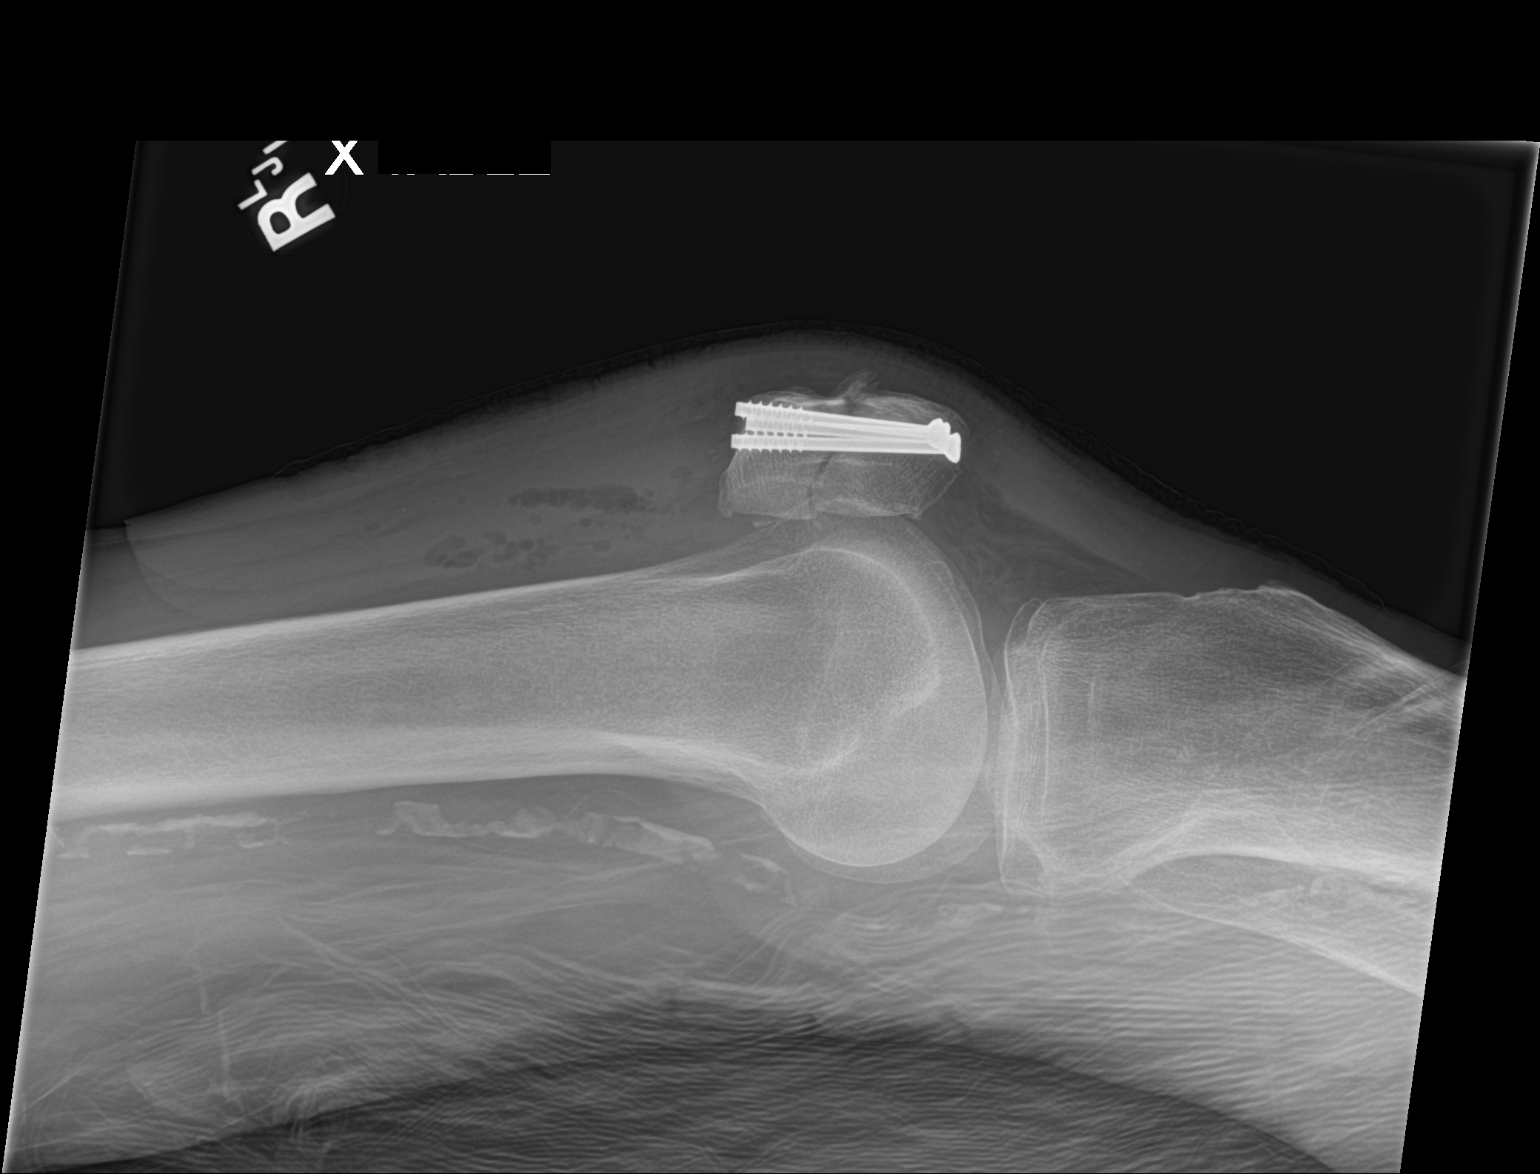

[2 of 2 positions shown; findings below may reference images not displayed]

FINDINGS: Two views of the knee demonstrate 3 surgical fixation screws in the
patella. Minimal residual displacement at the patella fracture.
Evidence for fluid and gas in the suprapatellar region and
compatible with recent surgery. Right knee is located.
IMPRESSION: Internal fixation of the patellar fracture. Minimal residual
displacement of the fracture.

Expected postoperative soft tissue changes.

## 2022-11-28 IMAGING — DX DG CHEST 2V
2 series · 2 of 2 positions shown · non-contrast
Comparison: 03/09/2022

CLINICAL DATA: Short of breath, history of pneumonia

EXAM:
CHEST - 2 VIEW

[chest pa]
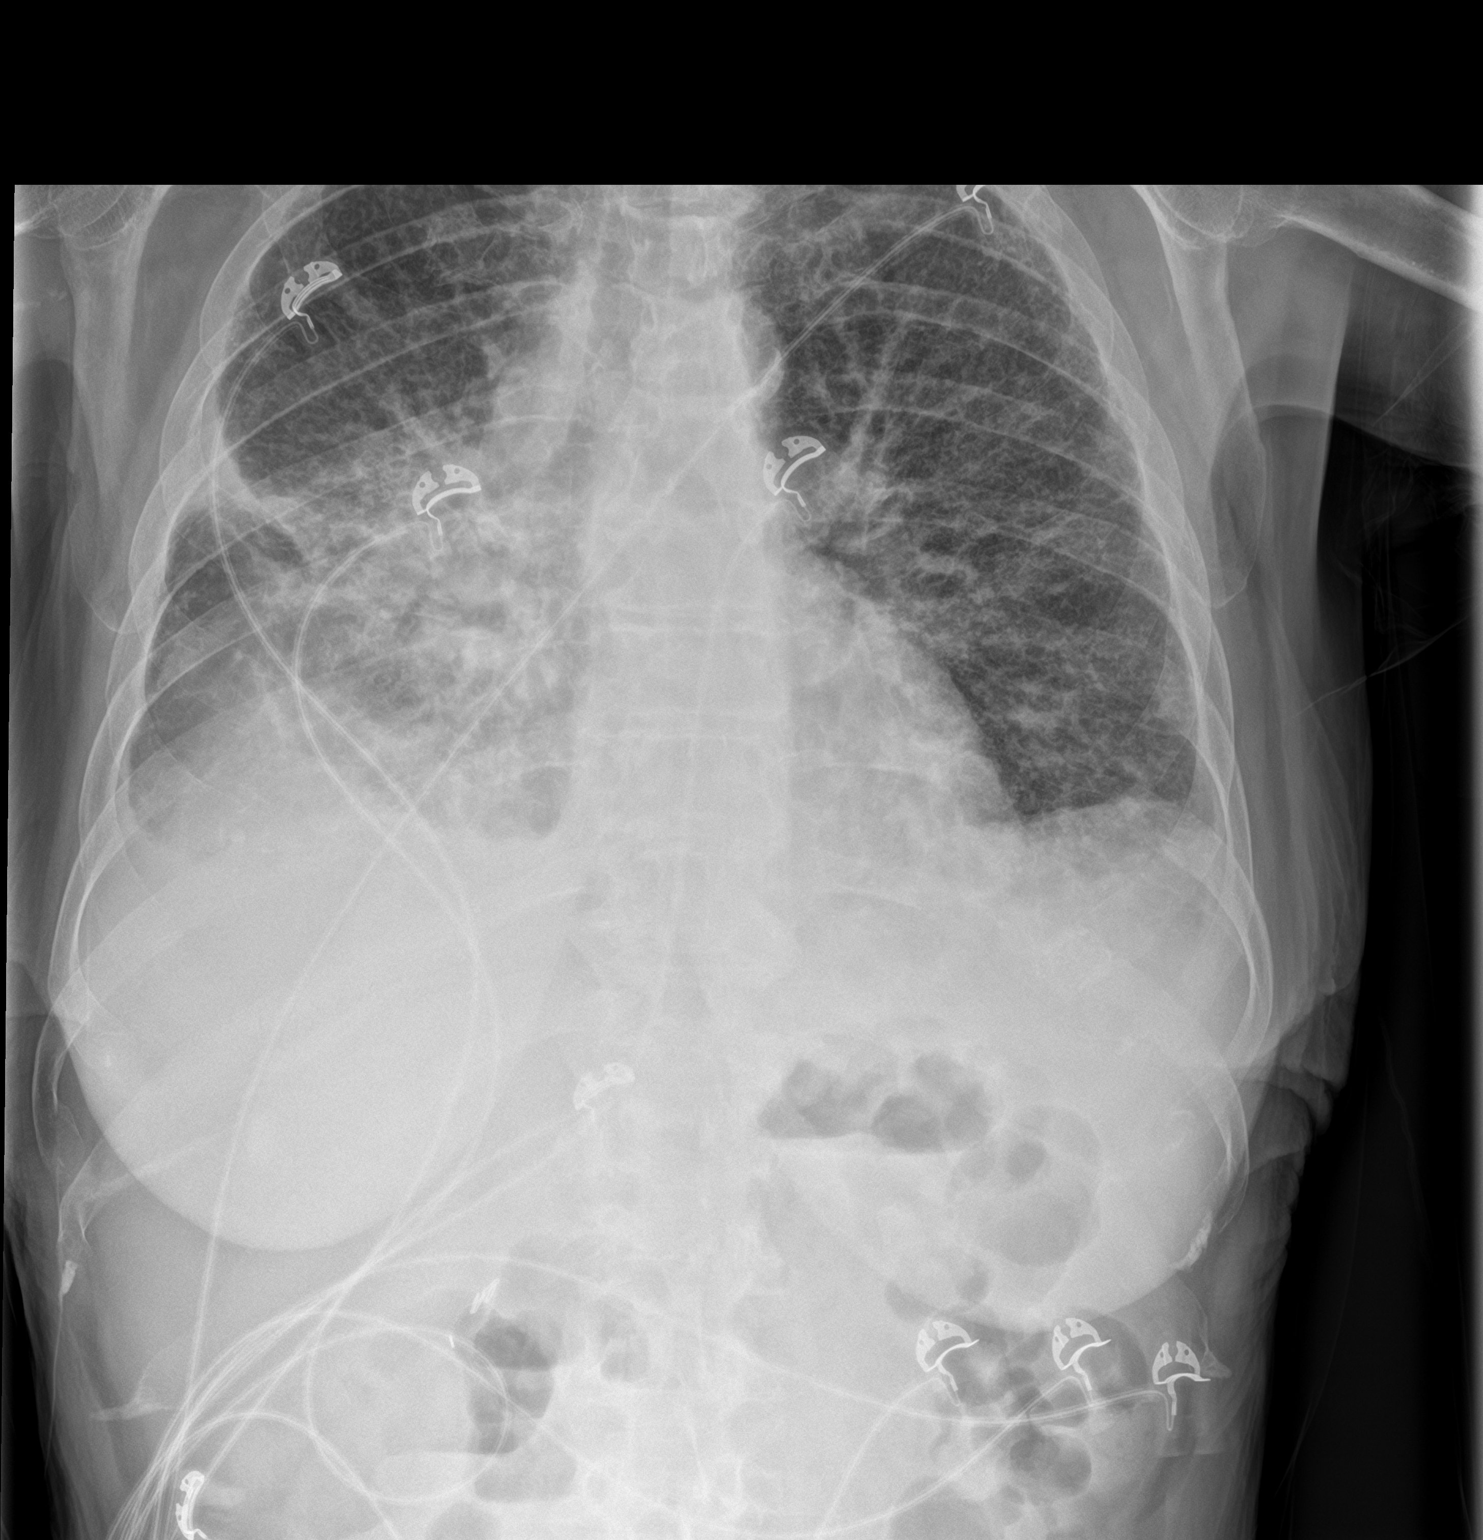

[chest lat]
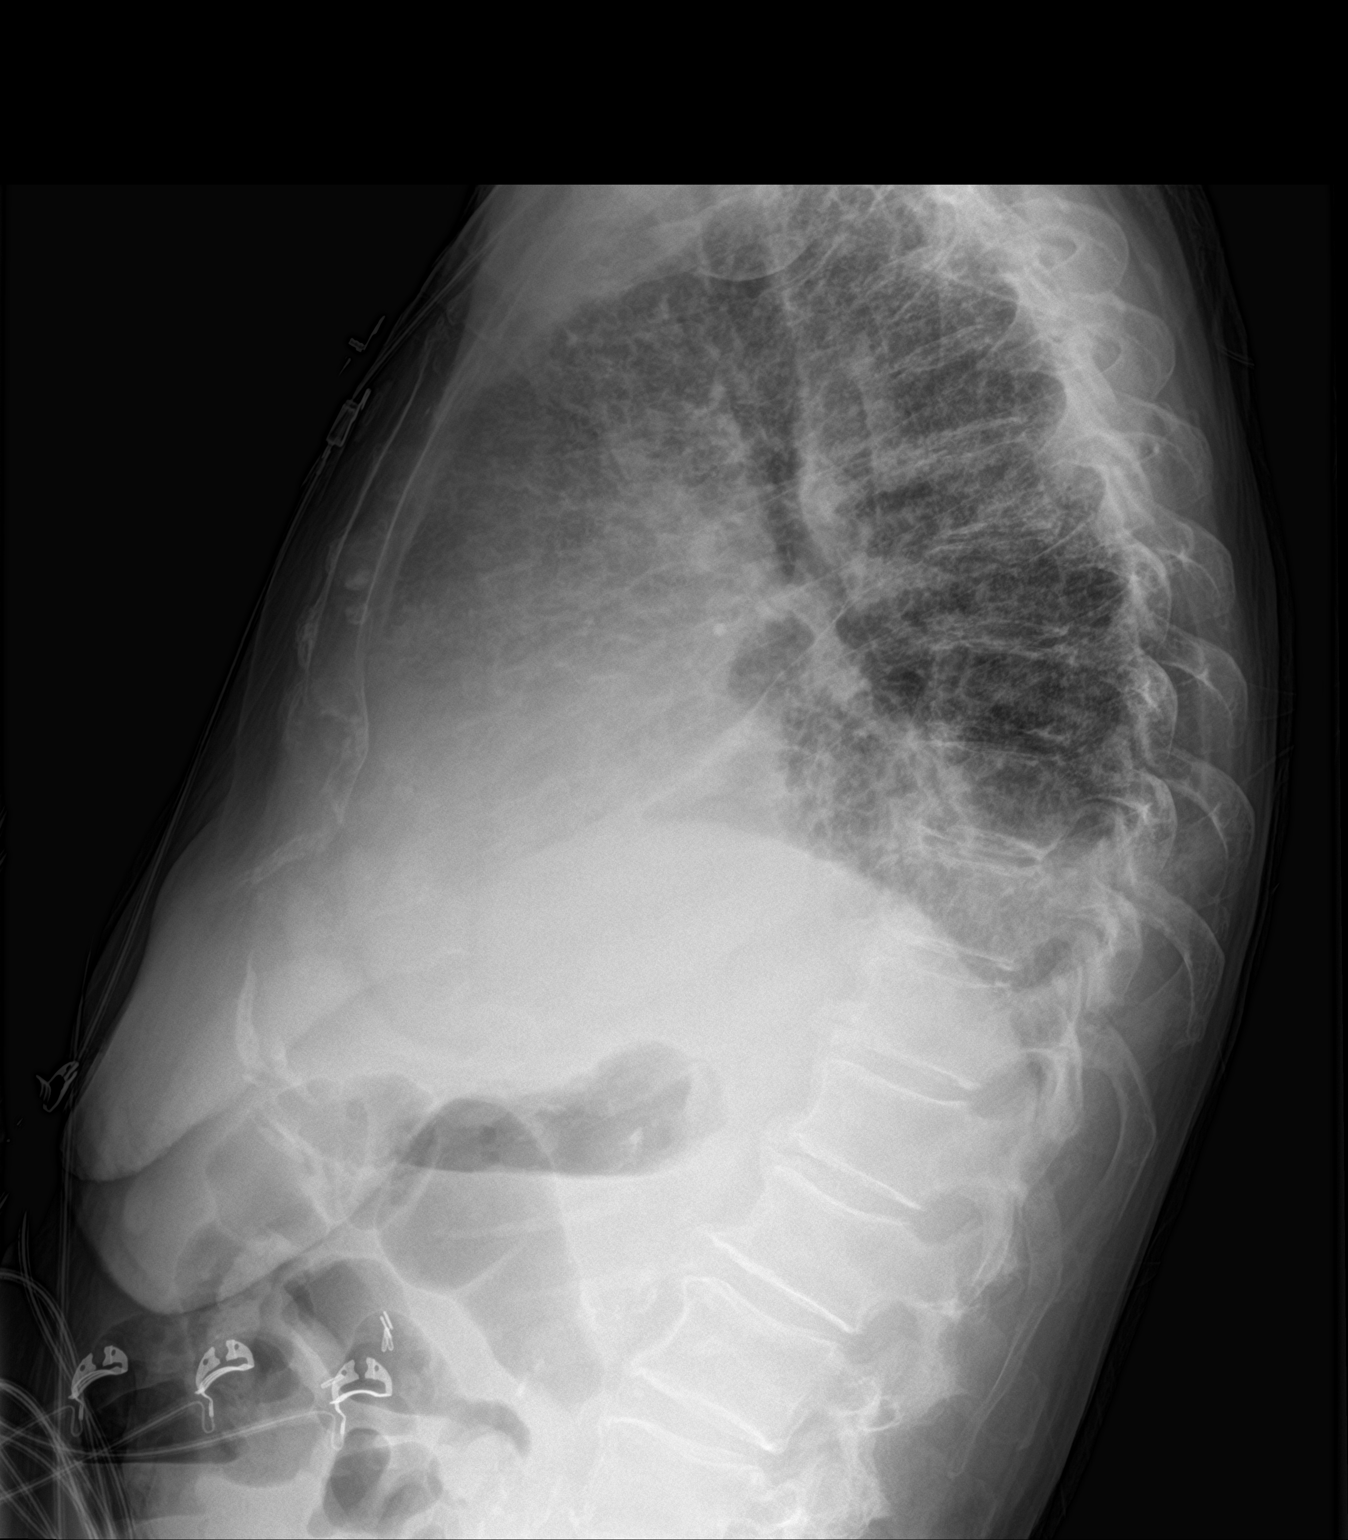

[2 of 2 positions shown; findings below may reference images not displayed]

FINDINGS: Frontal and lateral views of the chest demonstrate a stable cardiac
silhouette. There is continued consolidation within the right upper
and right lower lobes, with stable right pleural effusion. Stable
interstitial and ground-glass prominence throughout the remaining
portions of the lungs. No pneumothorax. No acute bony abnormalities.
IMPRESSION: 1. Continued right-sided pneumonia and parapneumonic effusion.
2. Chronic background scarring and fibrosis.

## 2022-12-09 IMAGING — CT CT CHEST W/O CM
2 of 4 series · 15 of 36 positions shown, 18 images · non-contrast
Comparison: Chest x-ray same day. CT chest abdomen and pelvis
10/20/2021.

CLINICAL DATA: Pleural effusion, malignancy suspected. History of
small cell lung cancer.



[Series 4: chest wo · axial · 0.71mm/px · z∈[-851,-577]mm · 12 of 163 slices shown, 15 images]
[im 13/163  mediastinal]
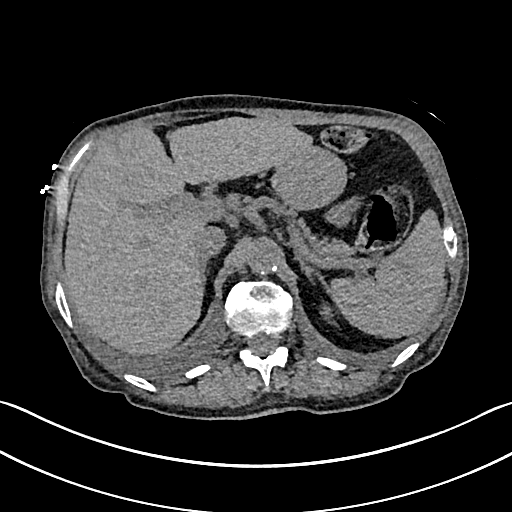
[im 13/163  lung]
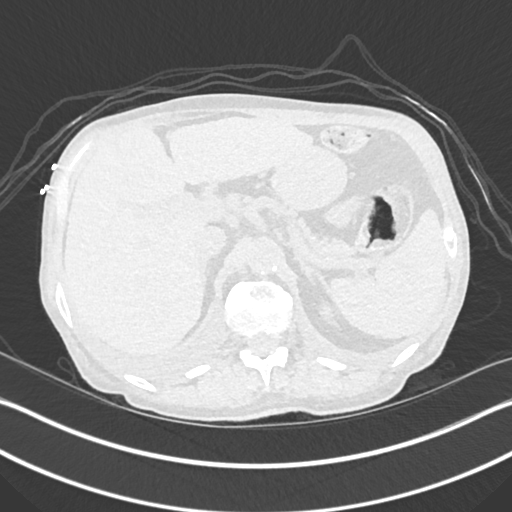
[im 25/163  lung]
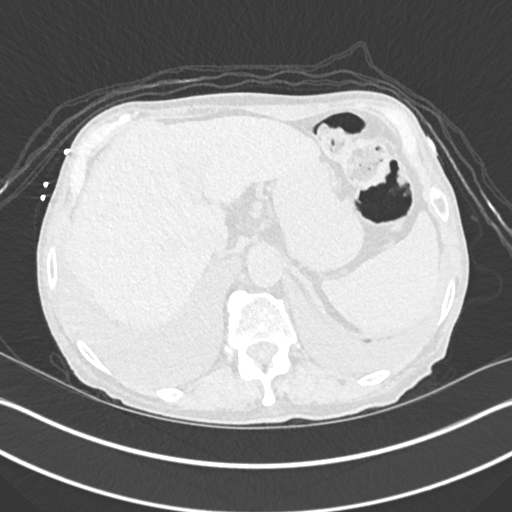
[im 38/163  lung]
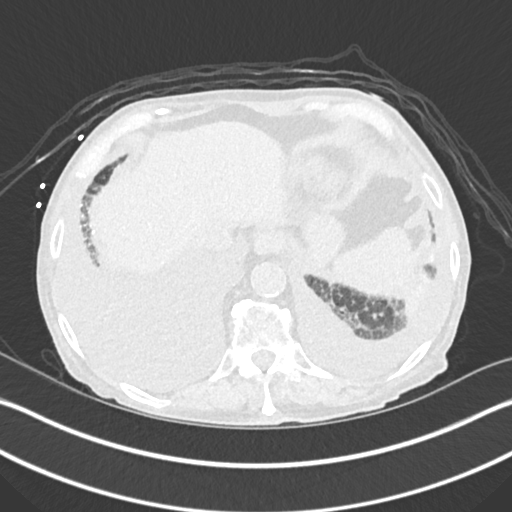
[im 50/163  lung]
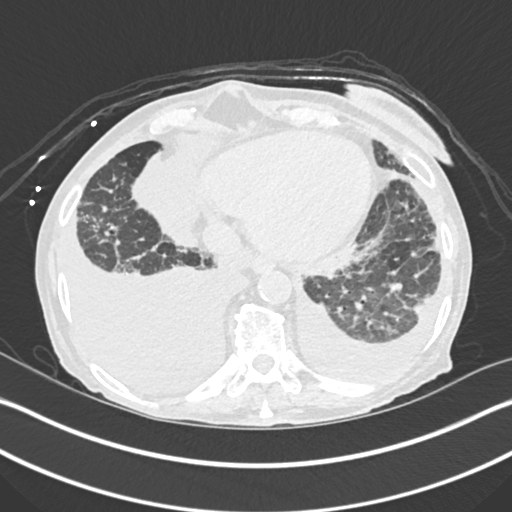
[im 63/163  mediastinal]
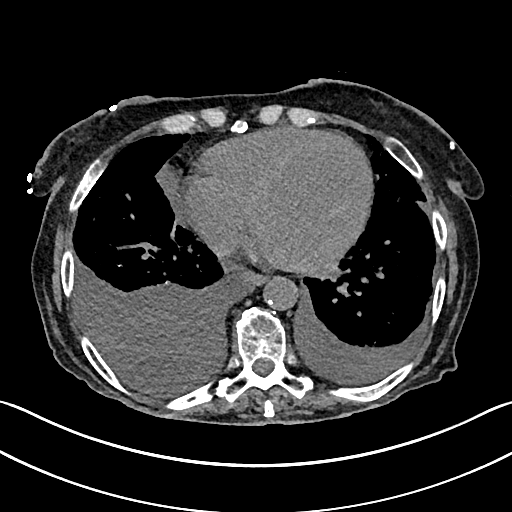
[im 63/163  lung]
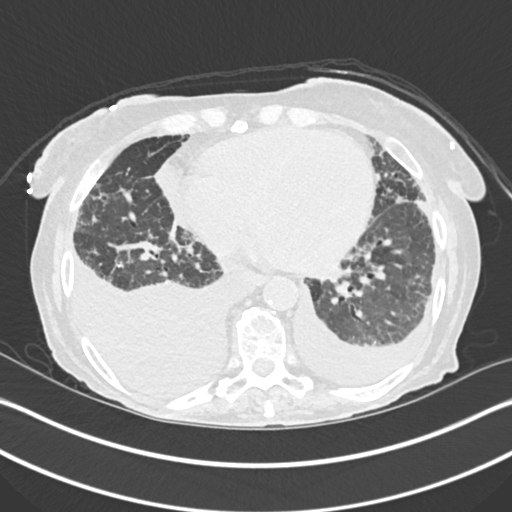
[im 75/163  lung]
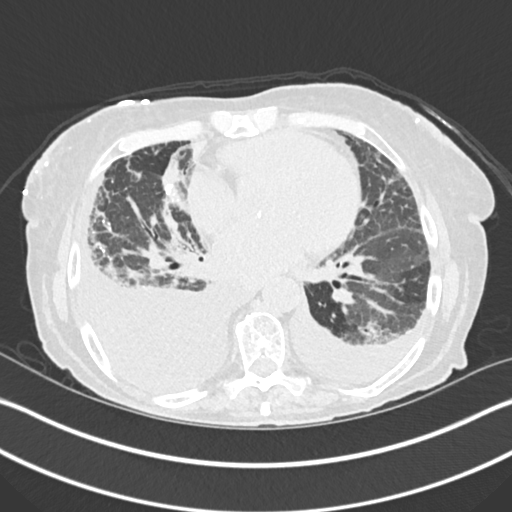
[im 88/163  lung]
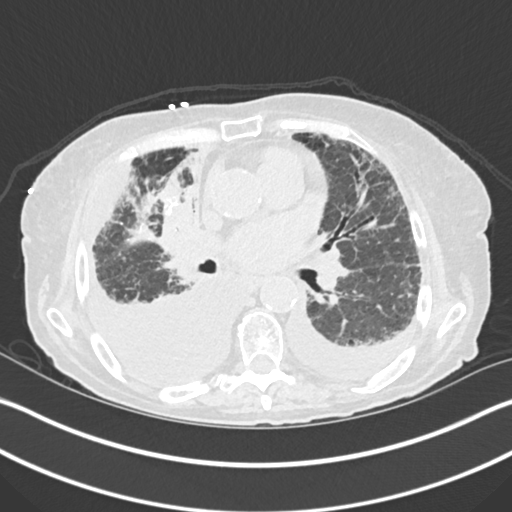
[im 100/163  lung]
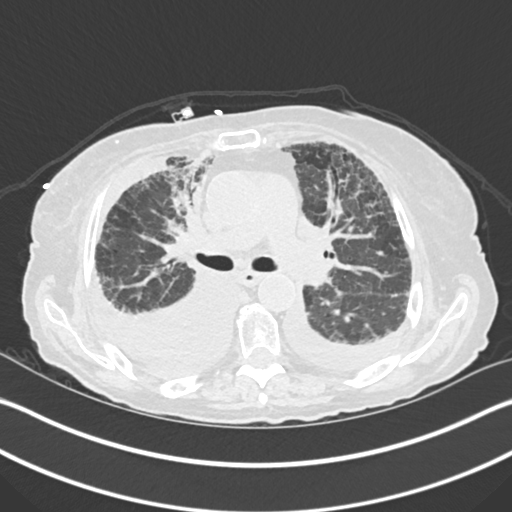
[im 113/163  mediastinal]
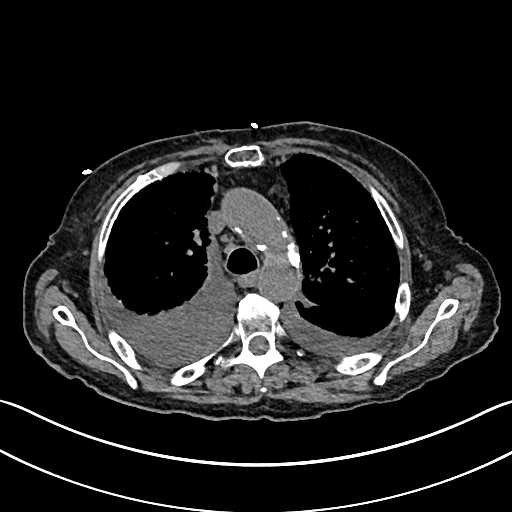
[im 113/163  lung]
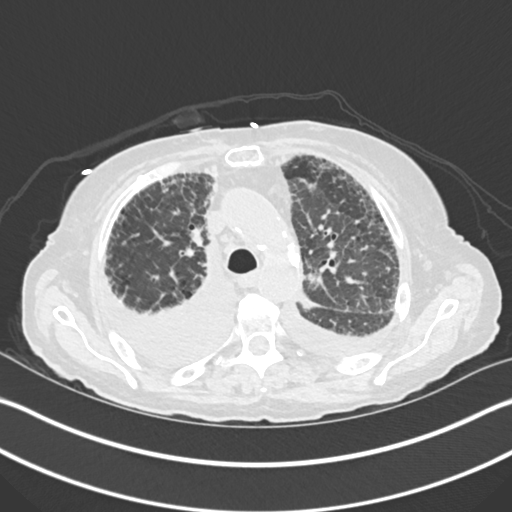
[im 125/163  lung]
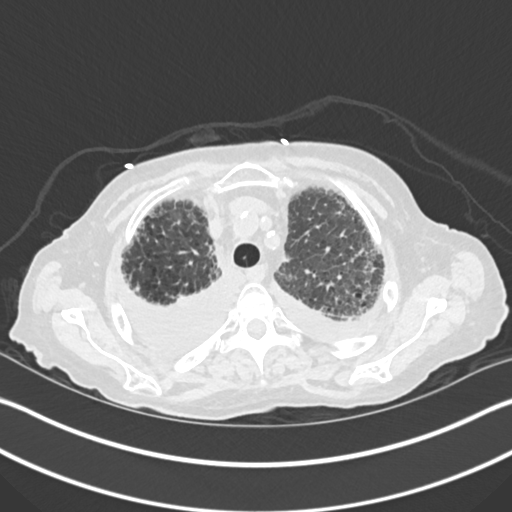
[im 138/163  lung]
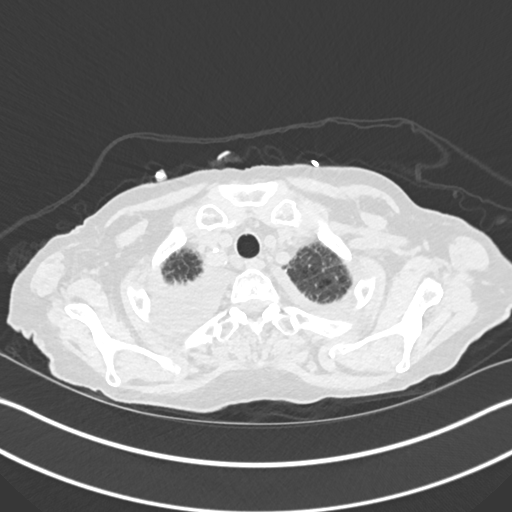
[im 150/163  lung]
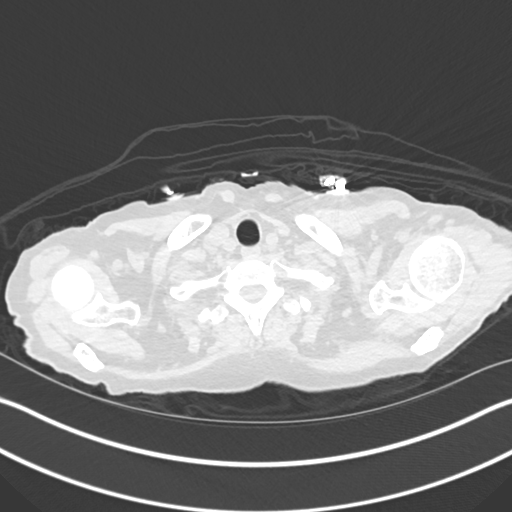

[Series 7: cor · coronal · 0.65mm/px · 3 of 132 slices shown]
[im 27/132  lung]
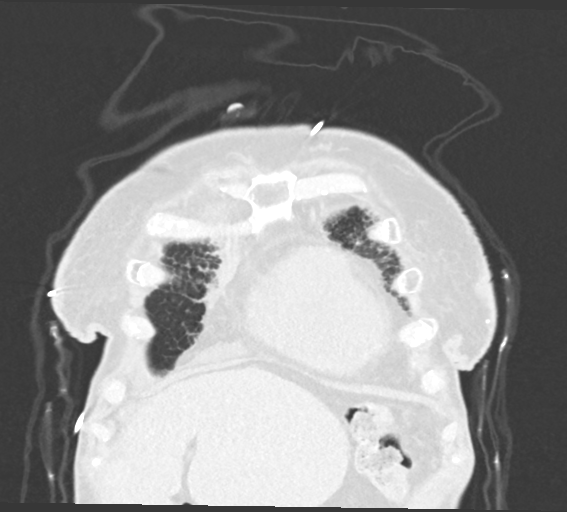
[im 53/132  lung]
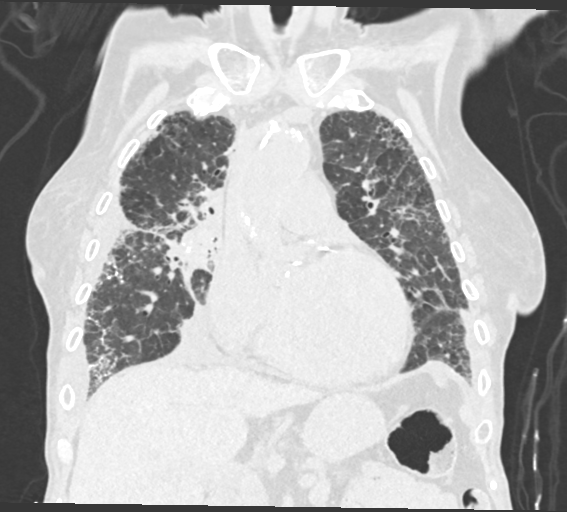
[im 79/132  lung]
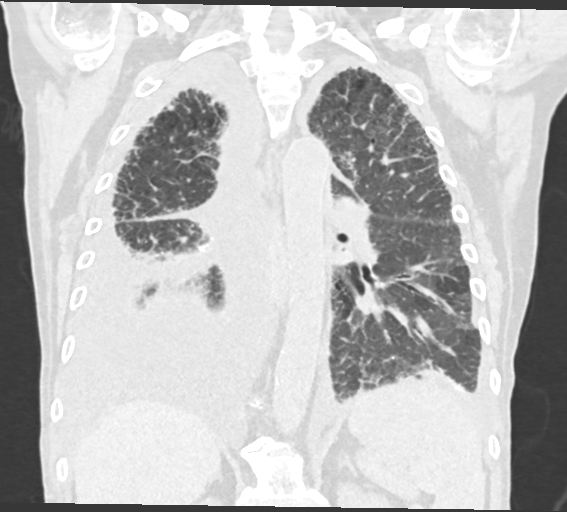

[15 of 36 positions shown; findings below may reference images not displayed]

FINDINGS: Cardiovascular: Heart is mildly enlarged, unchanged. Aorta is normal
in size. There are atherosclerotic calcifications of the aorta and
coronary arteries. There is no pericardial effusion.

Mediastinum/Nodes: No discrete enlarged lymph nodes are identified.
Evaluation for hilar lymphadenopathy is limited secondary to lack of
intravenous contrast. The esophagus and visualized thyroid gland are
within normal limits.

Lungs/Pleura: Chronic peripheral interstitial changes are similar to
the prior study. There is a moderate-sized partially loculated right
pleural effusion which is new from prior. There is also a new small
layering left pleural effusion. There is new airspace consolidation
within the medial right middle lobe and adjacent inferior right
upper lobe extending to the hilum. This abuts the mediastinum. There
is some central amorphous calcifications in this region image 6/81.
This area encompasses the previously identified nodular density in
the right middle lobe. Air bronchograms are present. Trachea and
central airways are patent.

Upper Abdomen: Cholecystectomy clips are present. No acute
abnormality.

Musculoskeletal: No chest wall mass or suspicious bone lesions
identified.
IMPRESSION: 1. New dense airspace consolidation in the medial right middle lobe
and inferior right upper lobe abutting the mediastinum. There is
some new calcifications in this region. Findings may related to
pneumonia, but underlying malignancy/recurrence not excluded.
2. New moderate-sized right pleural effusion, partially loculated.
3. New small layering left pleural effusion.
4. Stable cardiomegaly.
5. Stable chronic interstitial changes.

Aortic Atherosclerosis ([id number]).

## 2022-12-09 IMAGING — DX DG CHEST 2V
2 series · 2 of 2 positions shown · non-contrast
Comparison: 03/15/2022

CLINICAL DATA: Shortness of breath

EXAM:
CHEST - 2 VIEW

[chest pa]
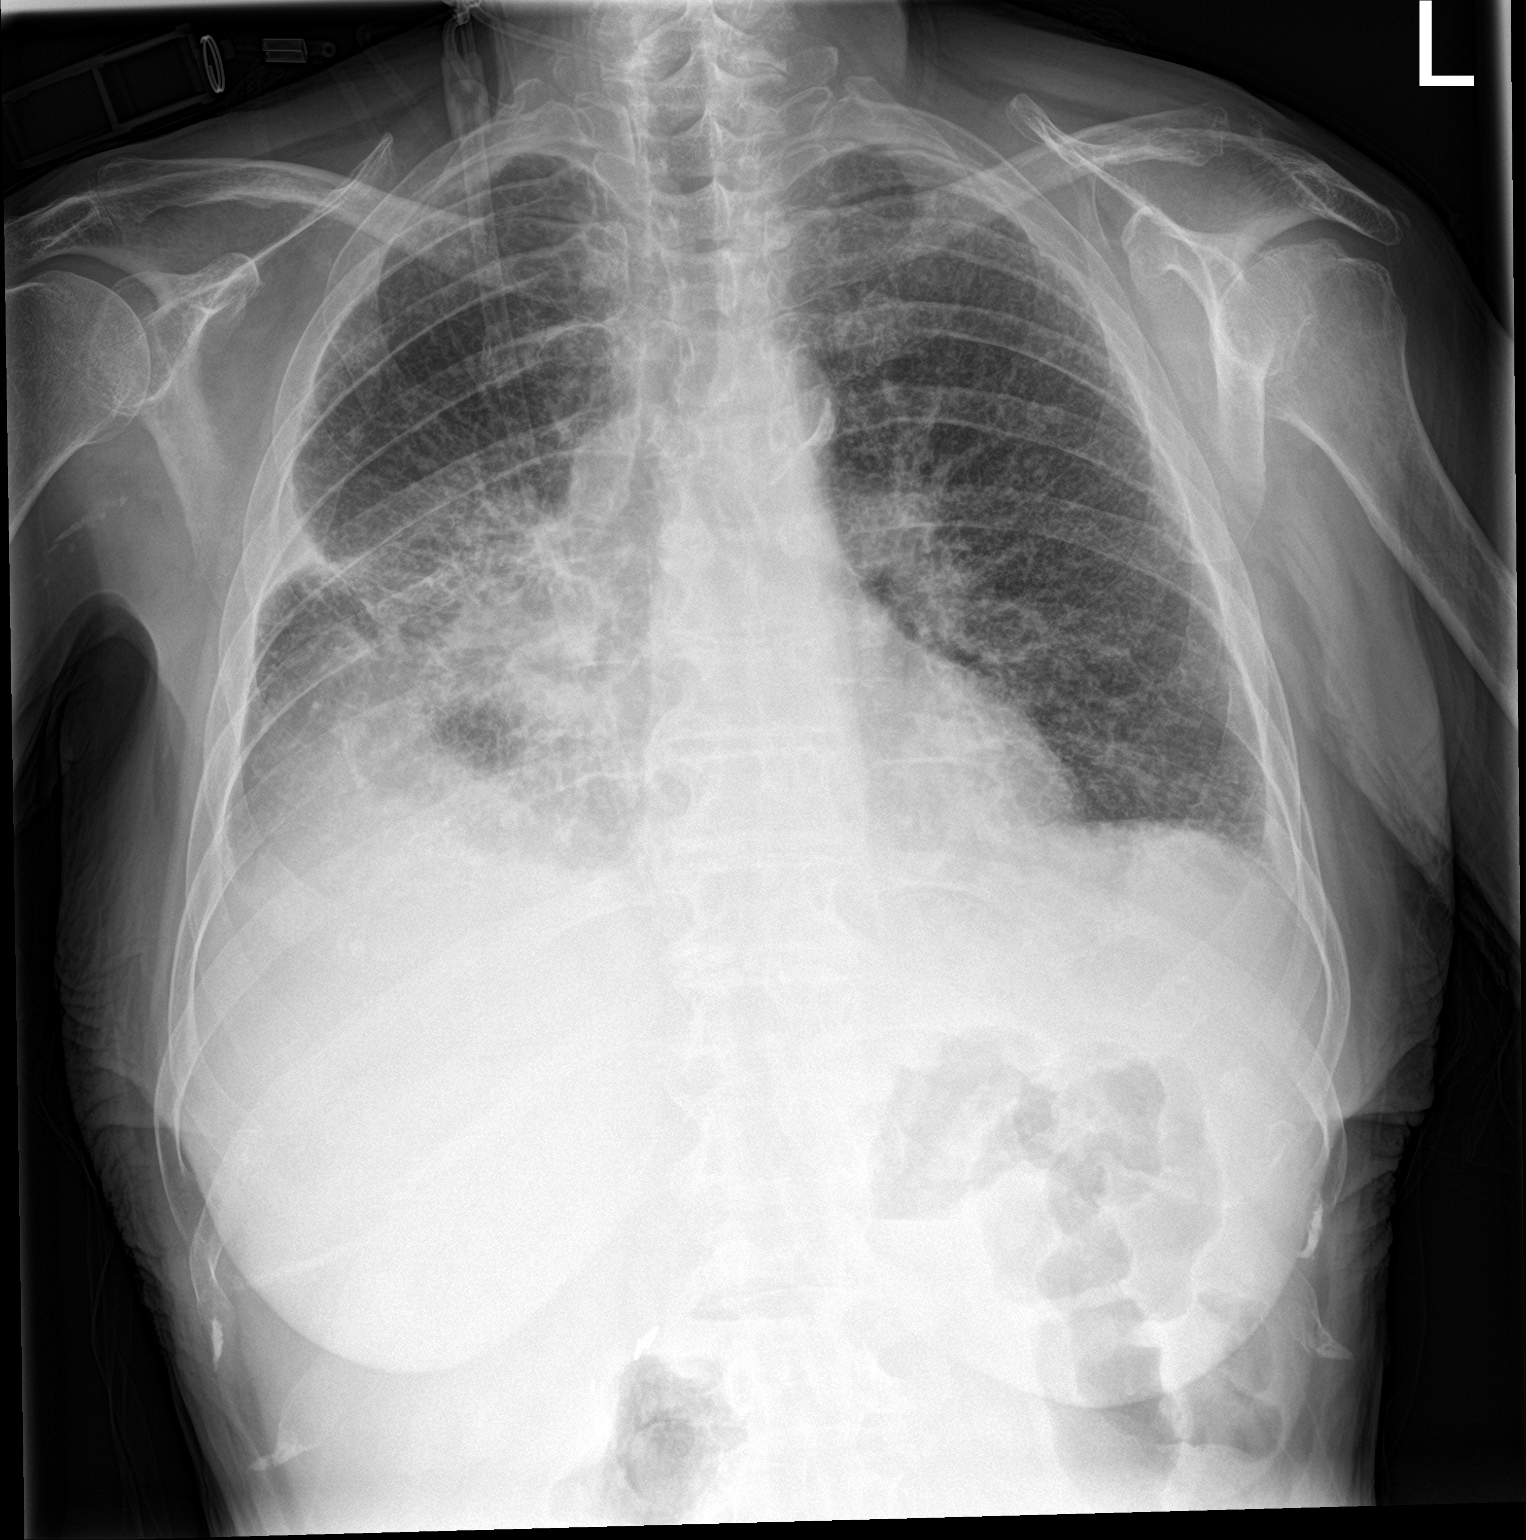

[chest lat]
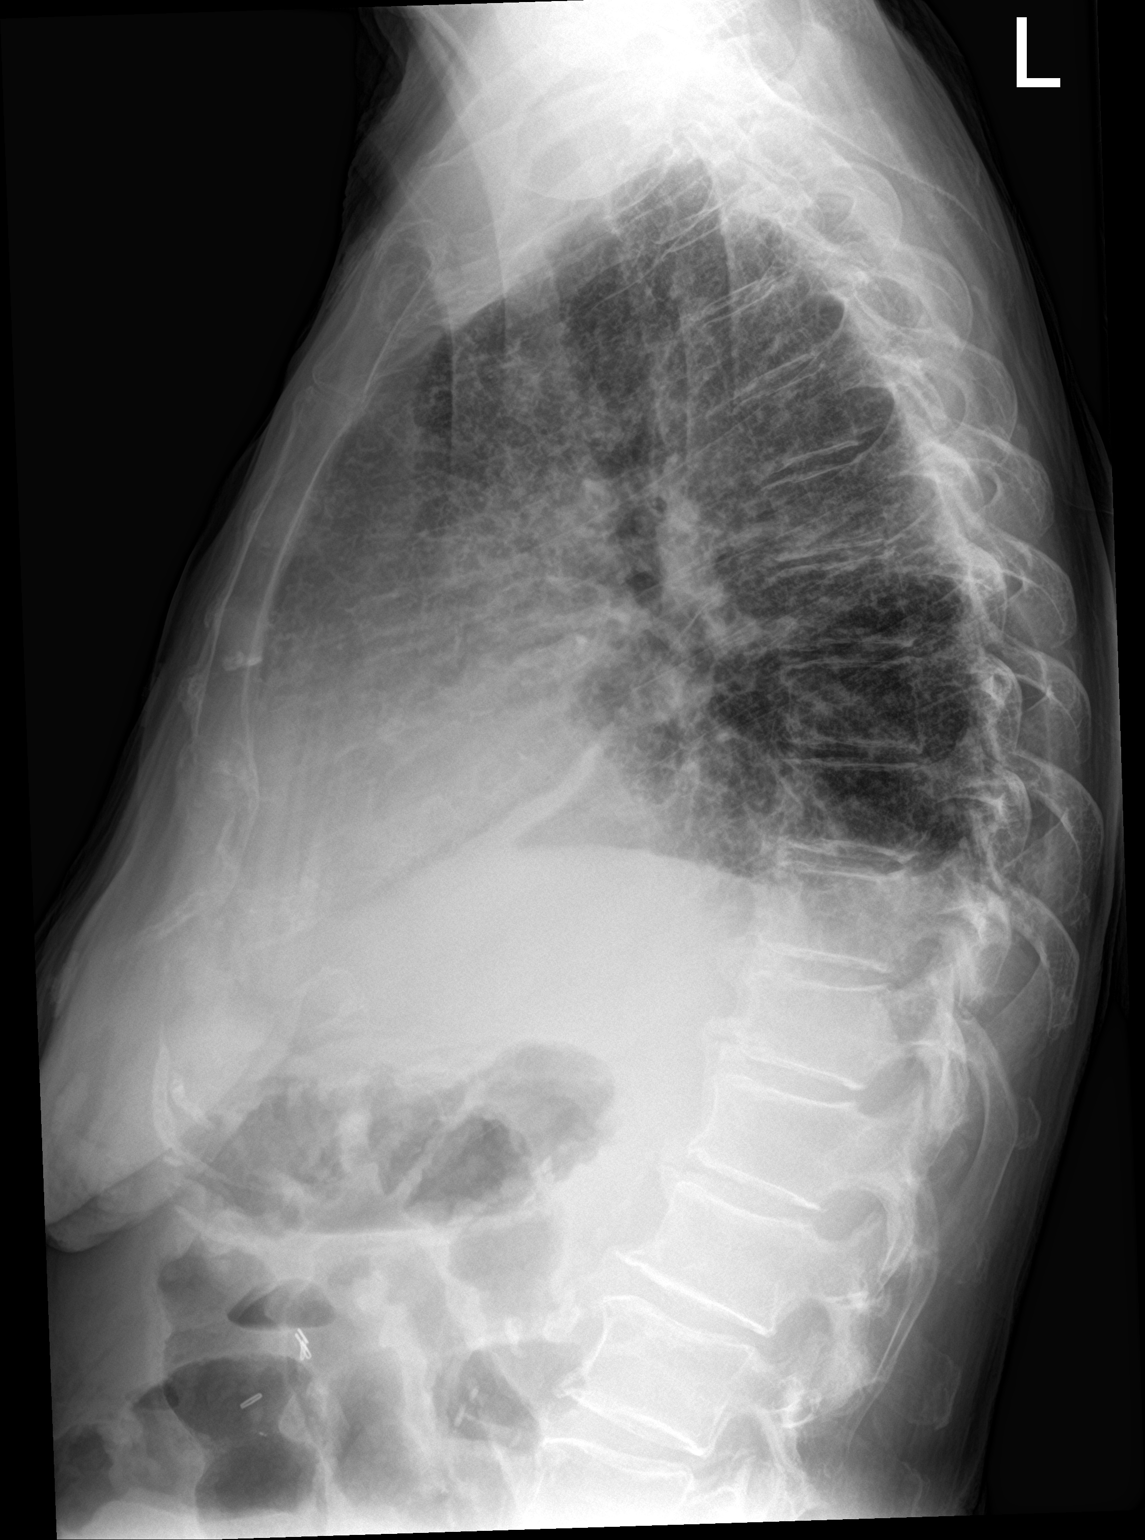

[2 of 2 positions shown; findings below may reference images not displayed]

FINDINGS: Bilateral diffuse interstitial thickening consistent with underlying
chronic interstitial lung disease. More focal right lower lobe and
right middle lobe airspace disease concerning for pneumonia. Trace
right pleural effusion. No left pleural effusion. No pneumothorax.
Stable cardiomediastinal silhouette. No aggressive osseous lesion.
IMPRESSION: 1. Right lower lobe and right middle lobe pneumonia superimposed
upon chronic interstitial lung disease. Overall, no significant
interval change.

## 2022-12-10 IMAGING — DX DG CHEST 1V PORT
1 series · 1 of 1 positions shown · non-contrast
Comparison: Earlier same day

CLINICAL DATA: Right-sided pneumothorax

EXAM:
PORTABLE CHEST 1 VIEW

[chest]
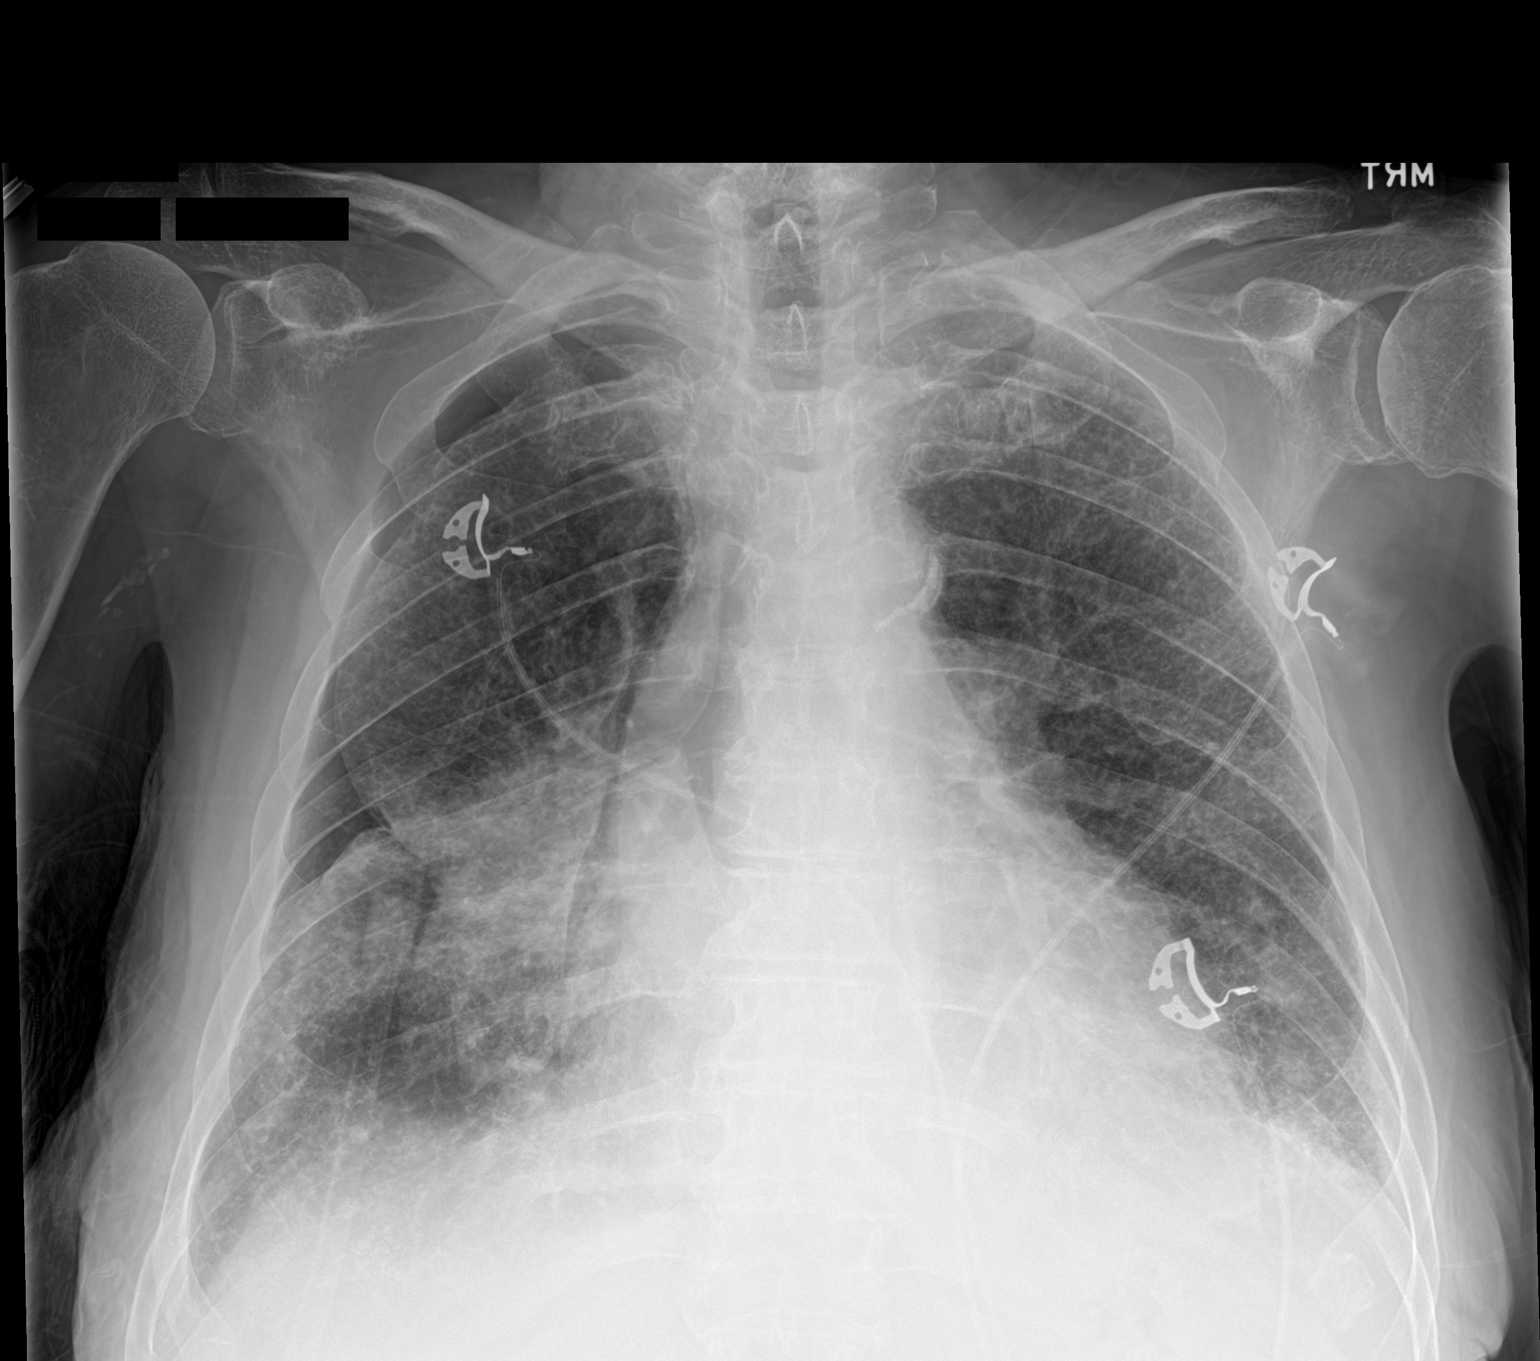

[1 of 1 positions shown; findings below may reference images not displayed]

FINDINGS: Right-sided pneumothorax is either the same or minimally larger. No
evidence of tension. Chronic interstitial lung markings remain
visible, with focal infiltrate in the right lower lung. Bilateral
layering effusions.
IMPRESSION: Right pneumothorax is either the same or minimally larger. No
evidence of tension. No other change.

## 2022-12-10 IMAGING — DX DG CHEST 1V PORT
1 series · 1 of 1 positions shown · non-contrast
Comparison: CT October 20, 2021 in March 26, 2022.
COMPARISON: CT October 20, 2021 in March 26, 2022.

Addendum:
CLINICAL DATA: Post thoracentesis on the RIGHT with shortness of
breath.

EXAM:
PORTABLE CHEST 1 VIEW

[chest]
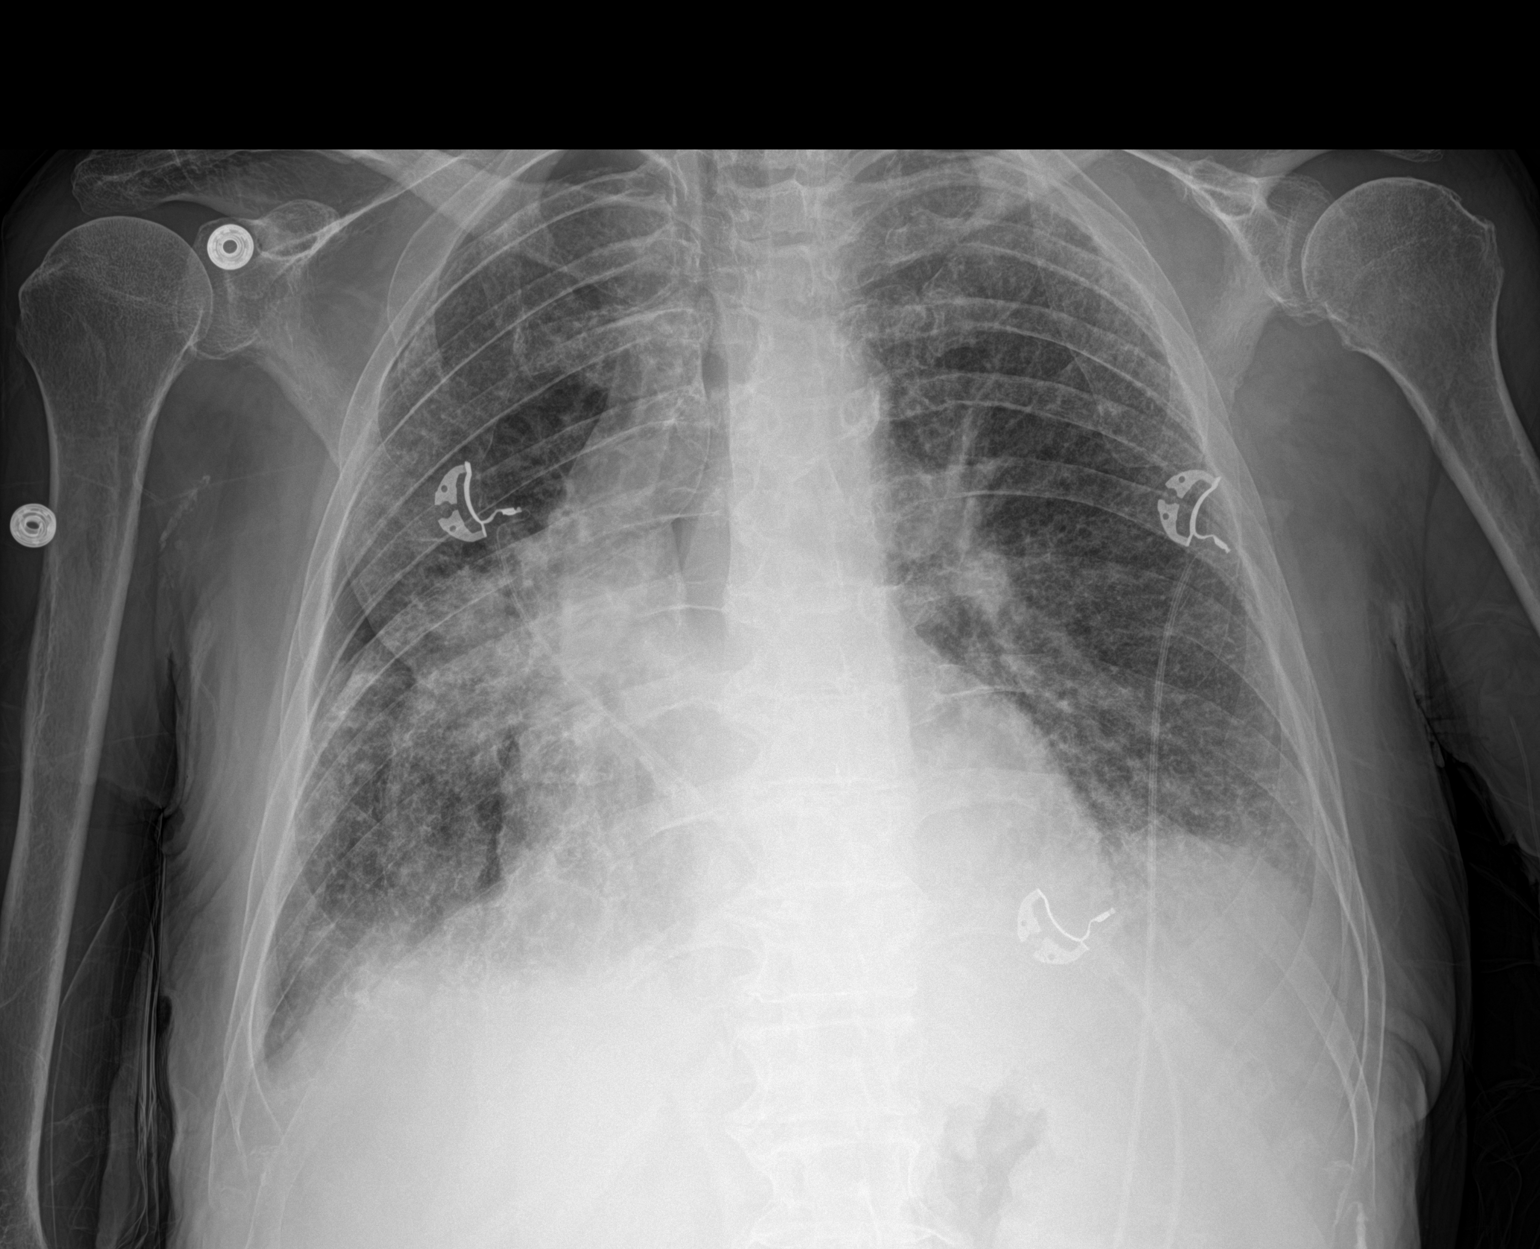

[1 of 1 positions shown; findings below may reference images not displayed]

FINDINGS: Lucency along the RIGHT mediastinal border. Lucency at the RIGHT
lung apex and along the RIGHT lateral chest implies moderate
pneumothorax following evacuation of partially loculated pleural
fluid in the RIGHT chest. There is perhaps trace pleural fluid in
the RIGHT lung base. There is no shift of mediastinal structures
accounting for slight rotation to the RIGHT. The heart size is
stable. Added density over the RIGHT heart border compatible with
treated lung cancer similar to previous imaging. Subtle graded
opacity at the inferior aspect of the LEFT chest. No LEFT-sided
pneumothorax.

On limited assessment there is no acute skeletal process.
IMPRESSION: 1. Moderate RIGHT pneumothorax following thoracentesis with likely
small amount of pleural fluid in the RIGHT lung base. Appearance of
pneumothorax implies areas of scarring and "trapped lung" following
treatment of lung cancer.
2. Subtle graded opacity in the inferior LEFT chest compatible with
small LEFT-sided effusion.
3. Background interstitial markings may reflect a combination of
edema and chronic changes.
4. A call is out to the referring provider to further discuss
findings in the above case.

ADDENDUM:
These results were called by telephone at the time of interpretation
on 03/27/2022 at [DATE] to provider PIOTR PAEZ , who verbally
acknowledged these results.

*** End of Addendum ***
FINDINGS: Lucency along the RIGHT mediastinal border. Lucency at the RIGHT
lung apex and along the RIGHT lateral chest implies moderate
pneumothorax following evacuation of partially loculated pleural
fluid in the RIGHT chest. There is perhaps trace pleural fluid in
the RIGHT lung base. There is no shift of mediastinal structures
accounting for slight rotation to the RIGHT. The heart size is
stable. Added density over the RIGHT heart border compatible with
treated lung cancer similar to previous imaging. Subtle graded
opacity at the inferior aspect of the LEFT chest. No LEFT-sided
pneumothorax.

On limited assessment there is no acute skeletal process.
IMPRESSION: 1. Moderate RIGHT pneumothorax following thoracentesis with likely
small amount of pleural fluid in the RIGHT lung base. Appearance of
pneumothorax implies areas of scarring and "trapped lung" following
treatment of lung cancer.
2. Subtle graded opacity in the inferior LEFT chest compatible with
small LEFT-sided effusion.
3. Background interstitial markings may reflect a combination of
edema and chronic changes.
4. A call is out to the referring provider to further discuss
findings in the above case.

## 2022-12-11 IMAGING — DX DG CHEST 1V PORT
1 series · 1 of 1 positions shown · non-contrast
Comparison: Chest radiograph 1 day prior

CLINICAL DATA: Acute respiratory failure

EXAM:
PORTABLE CHEST 1 VIEW

[chest ap]
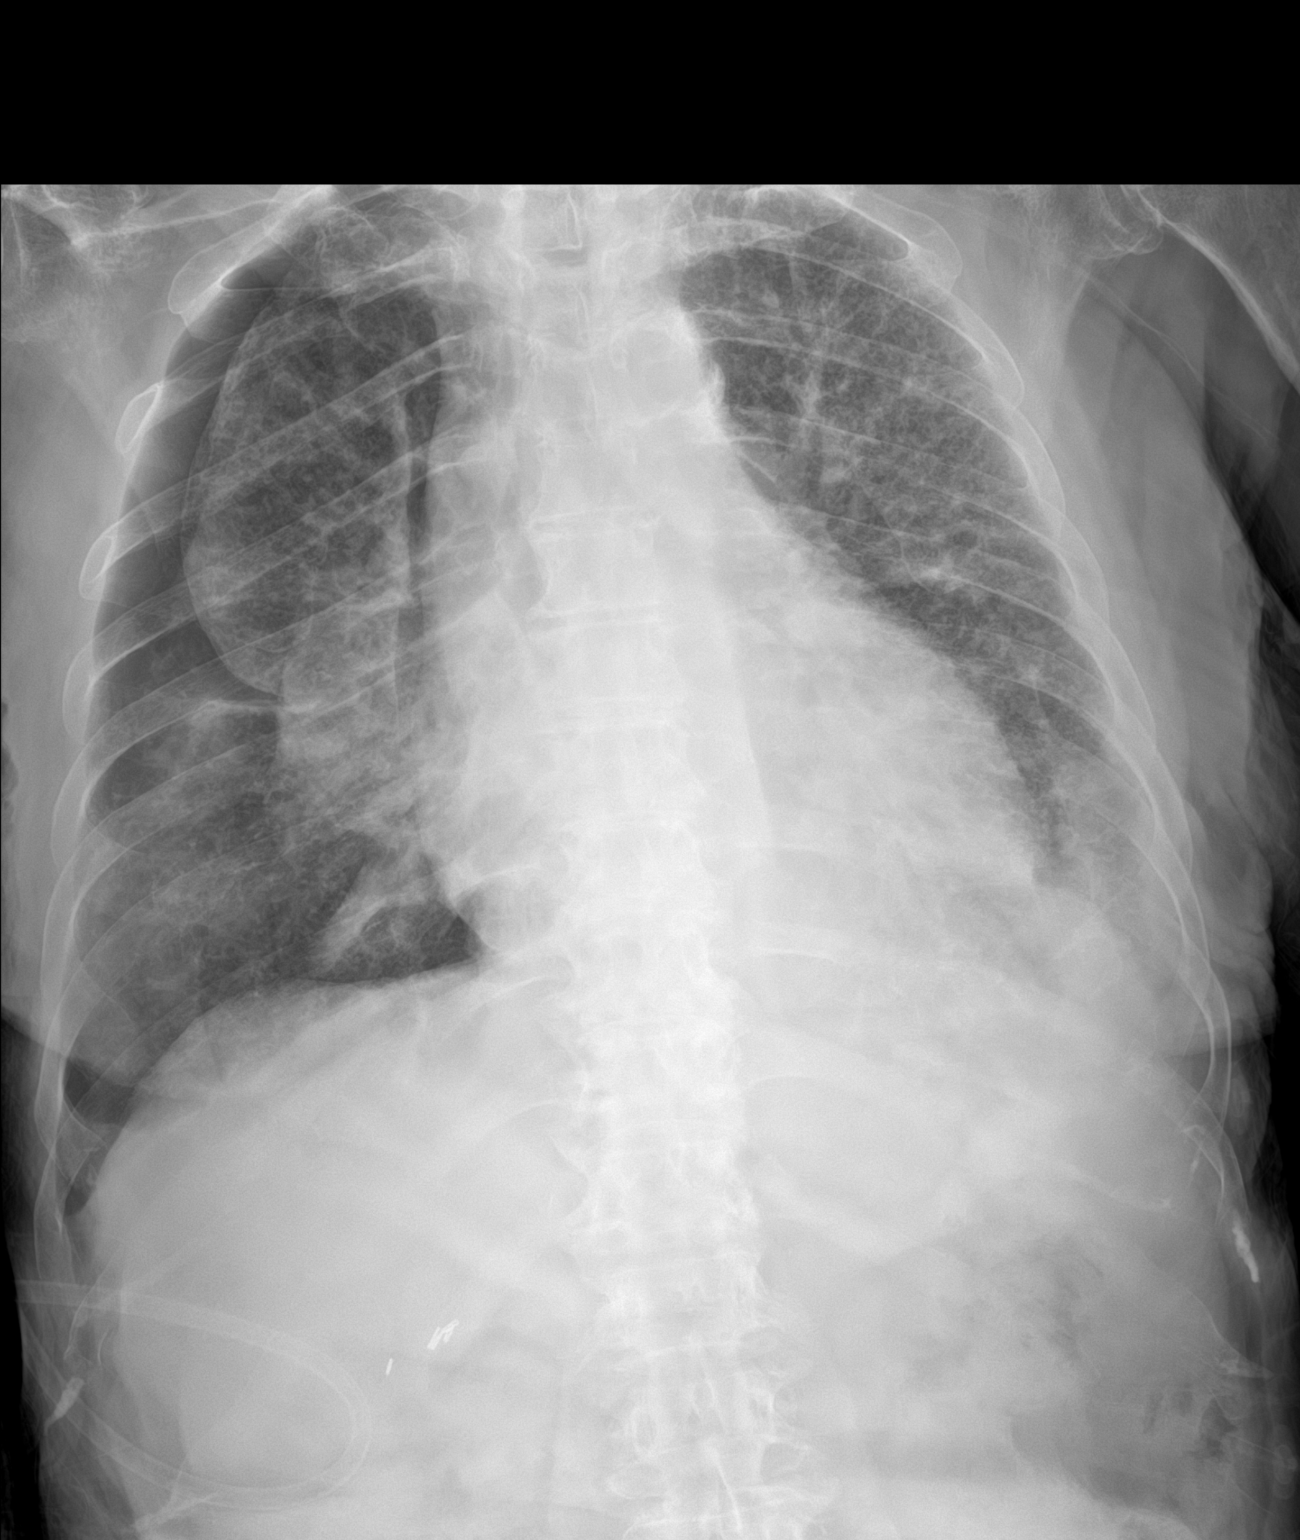

[1 of 1 positions shown; findings below may reference images not displayed]

FINDINGS: Cardiomediastinal silhouette is stable

The right pneumothorax appears slightly increased in size since the
study from 1 day prior. There is no leftward mediastinal shift.
Patchy opacities in the left lung base have worsened in the interim,
with a likely small left pleural effusion. There is no significant
right effusion.

The bones are stable.
IMPRESSION: 1. Slight interval increase in size of the right pneumothorax with
no leftward mediastinal shift.
2. Worsening opacities in the left lung base with a small left
pleural effusion.

These results will be called to the ordering clinician or
representative by the Radiologist Assistant, and communication
documented in the PACS or [REDACTED].

## 2022-12-11 IMAGING — DX DG CHEST 1V PORT
1 series · 1 of 1 positions shown · non-contrast
Comparison: Radiographs earlier the same date and 03/27/2022. CT
03/26/2022.

CLINICAL DATA: Follow-up hydropneumothorax.

EXAM:
PORTABLE CHEST 1 VIEW

[chest]
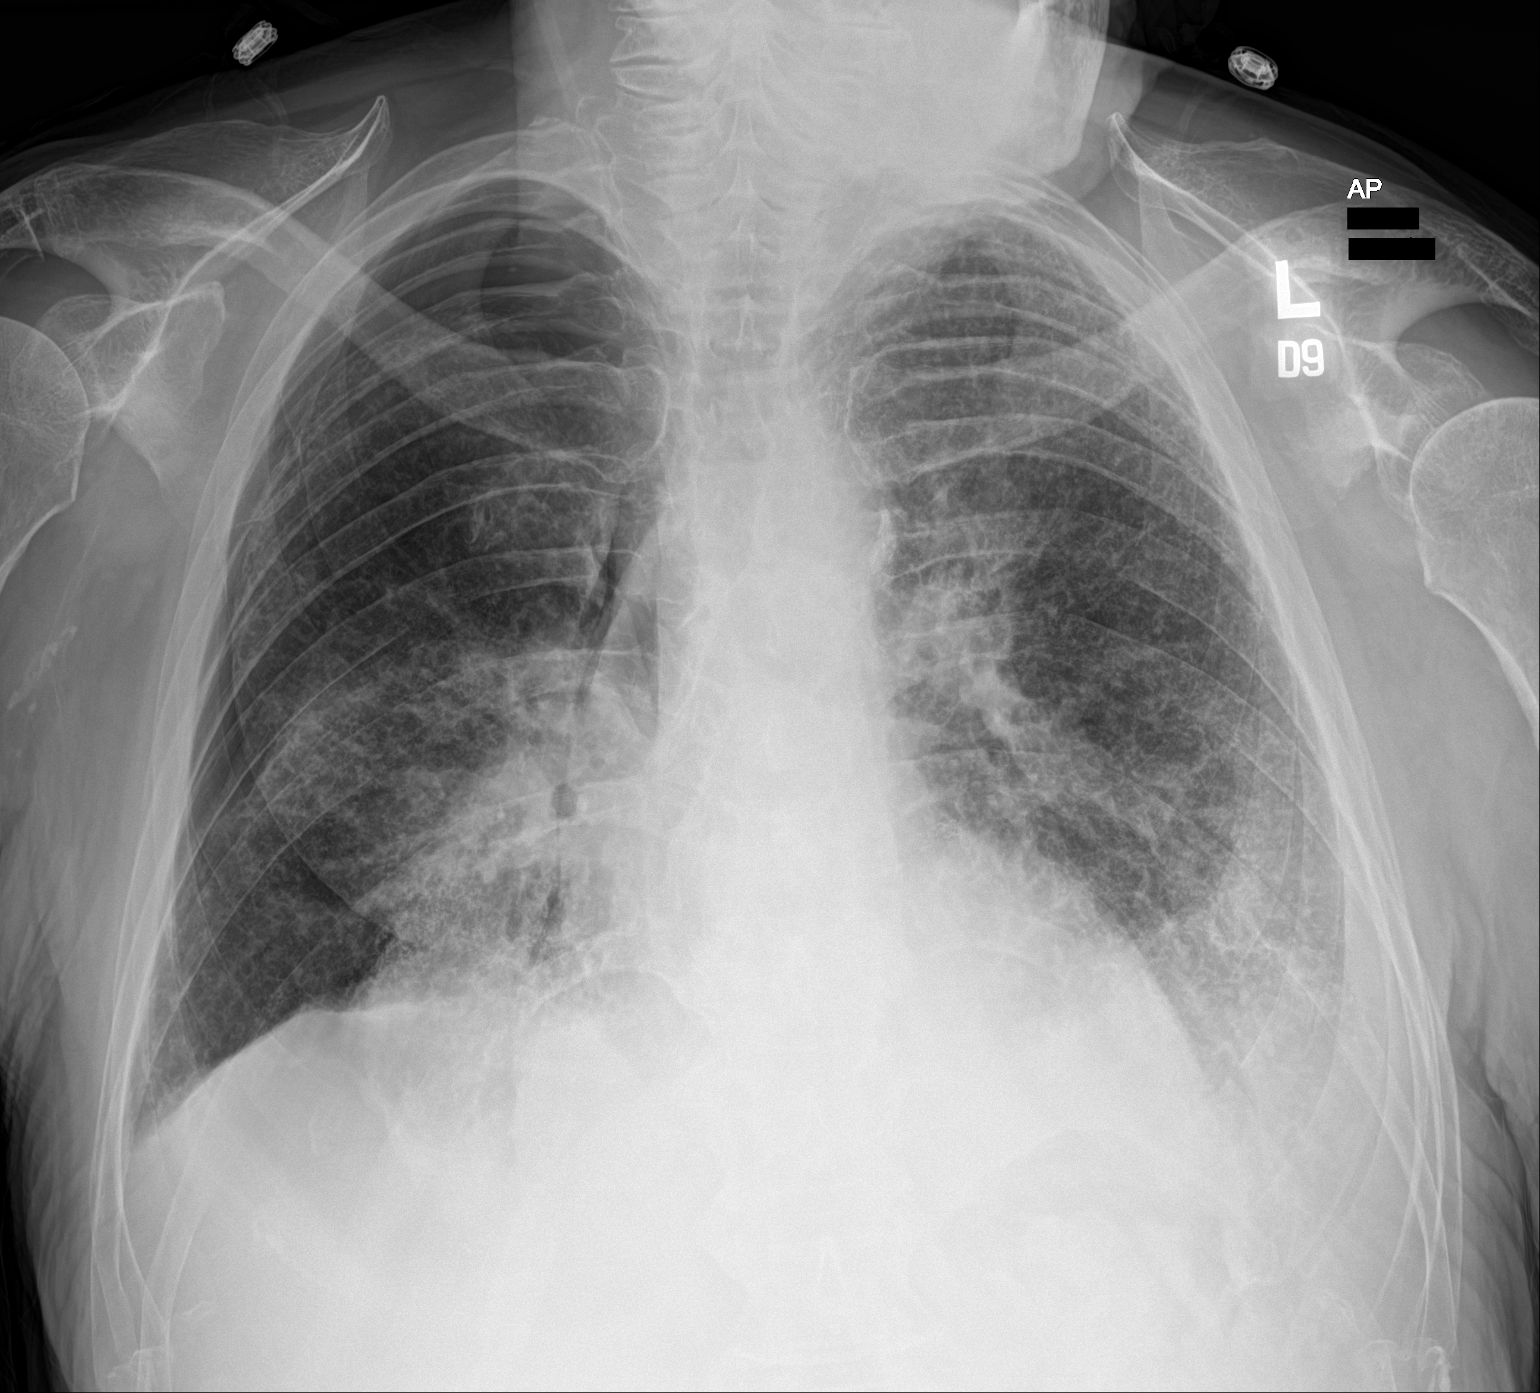

[1 of 1 positions shown; findings below may reference images not displayed]

FINDINGS: 4119 hours. Previously demonstrated right-sided pneumothorax is
slightly smaller with partial re-expansion of the right upper lobe.
There is no mediastinal shift. Underlying interstitial opacities in
both lungs are stable, likely due to fibrosis. There is probably a
small amount of pleural fluid bilaterally. No acute osseous
findings.
IMPRESSION: Interval decreased size of right-sided hydropneumothorax without
mediastinal shift. Grossly stable underlying fibrotic interstitial
lung disease.

## 2022-12-12 IMAGING — DX DG CHEST 1V PORT
1 series · 1 of 1 positions shown · non-contrast
Comparison: Previous studies including the examination done earlier
today

CLINICAL DATA: Right pneumothorax

EXAM:
PORTABLE CHEST 1 VIEW

[chest ap]
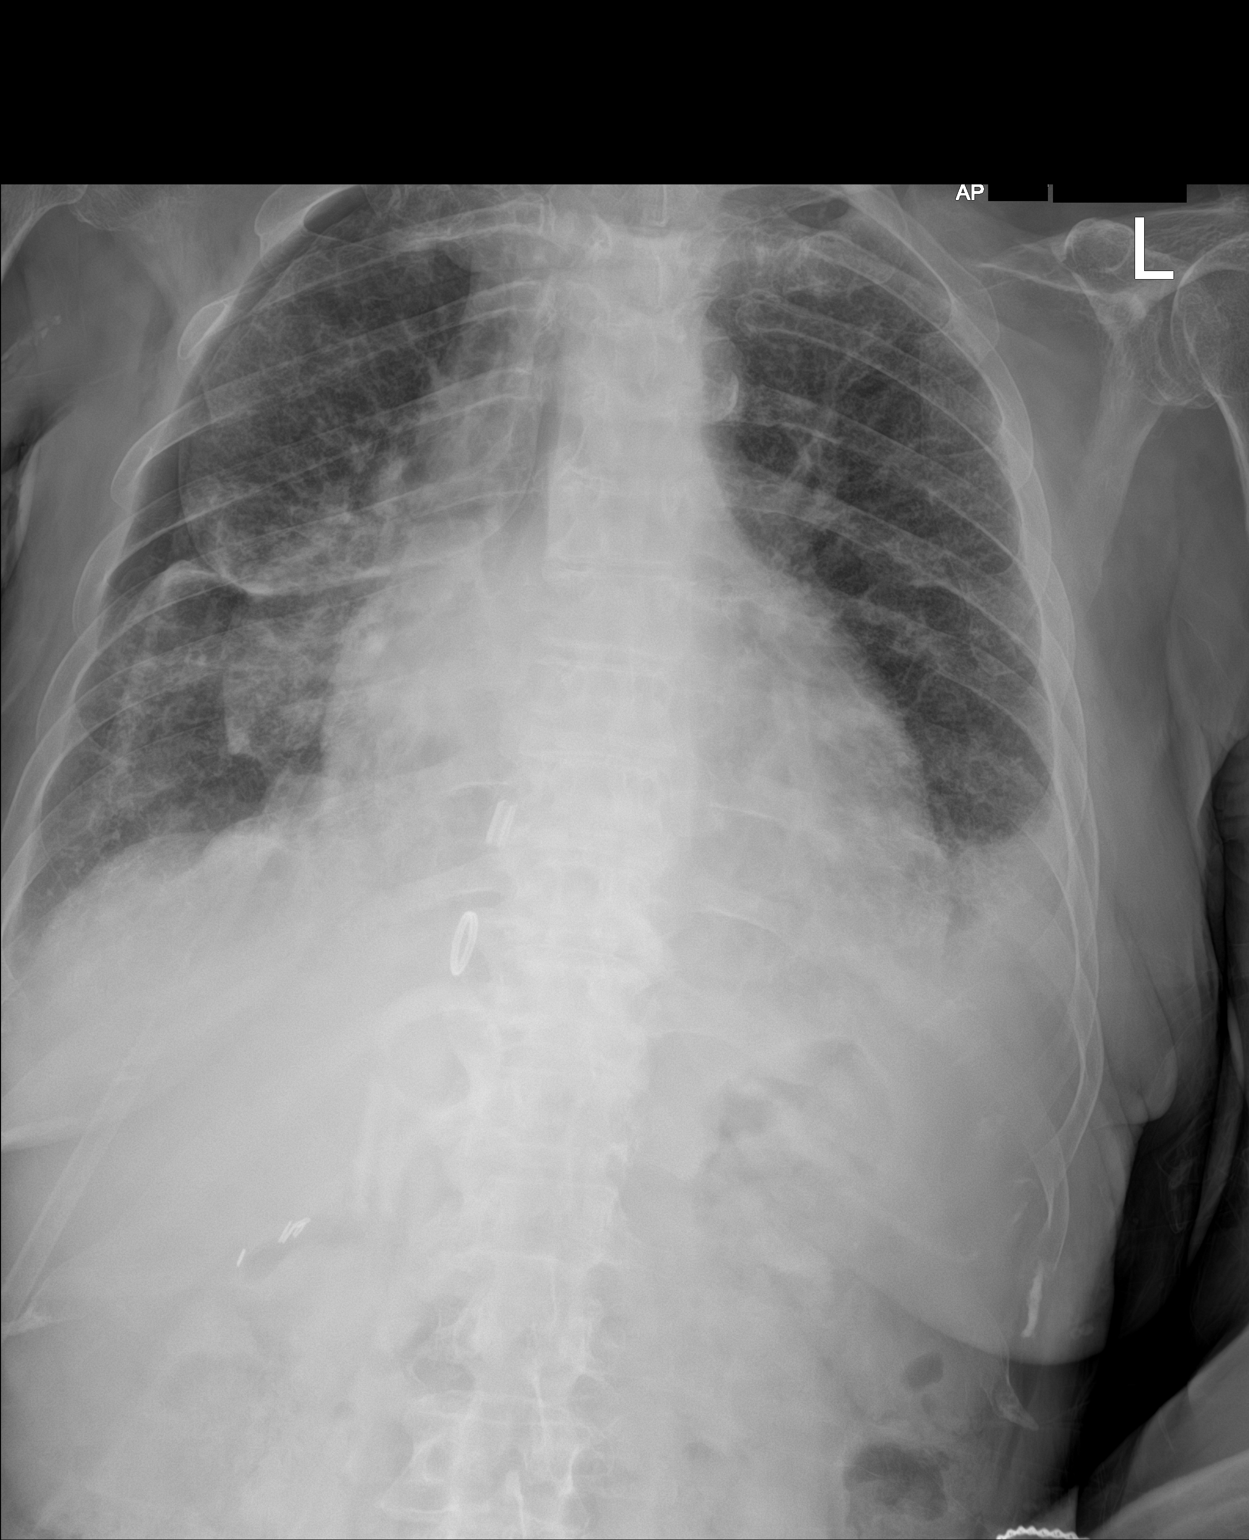

[1 of 1 positions shown; findings below may reference images not displayed]

FINDINGS: There is interval removal of right chest tube. There is slight
increase in size of right pneumothorax. Transverse diameter of heart
is increased. Increased interstitial markings in both lungs may
suggest scarring. There is small left pleural effusion. Surgical
clips are seen in the right upper quadrant of abdomen.
IMPRESSION: There is slight increase in size of right pneumothorax after removal
of right chest tube. Cardiomegaly. Left pleural effusion. Increased
interstitial markings in both lungs suggest scarring. Possibility of
superimposed interstitial pneumonia is not excluded.

## 2022-12-12 IMAGING — DX DG CHEST 1V PORT
1 series · 1 of 1 positions shown · non-contrast
Comparison: Chest radiograph March 28, 2022.
COMPARISON: Chest radiograph March 28, 2022.

Addendum:
CLINICAL DATA: Chest radiograph March 28, 2021.

EXAM:
PORTABLE CHEST 1 VIEW

[chest]
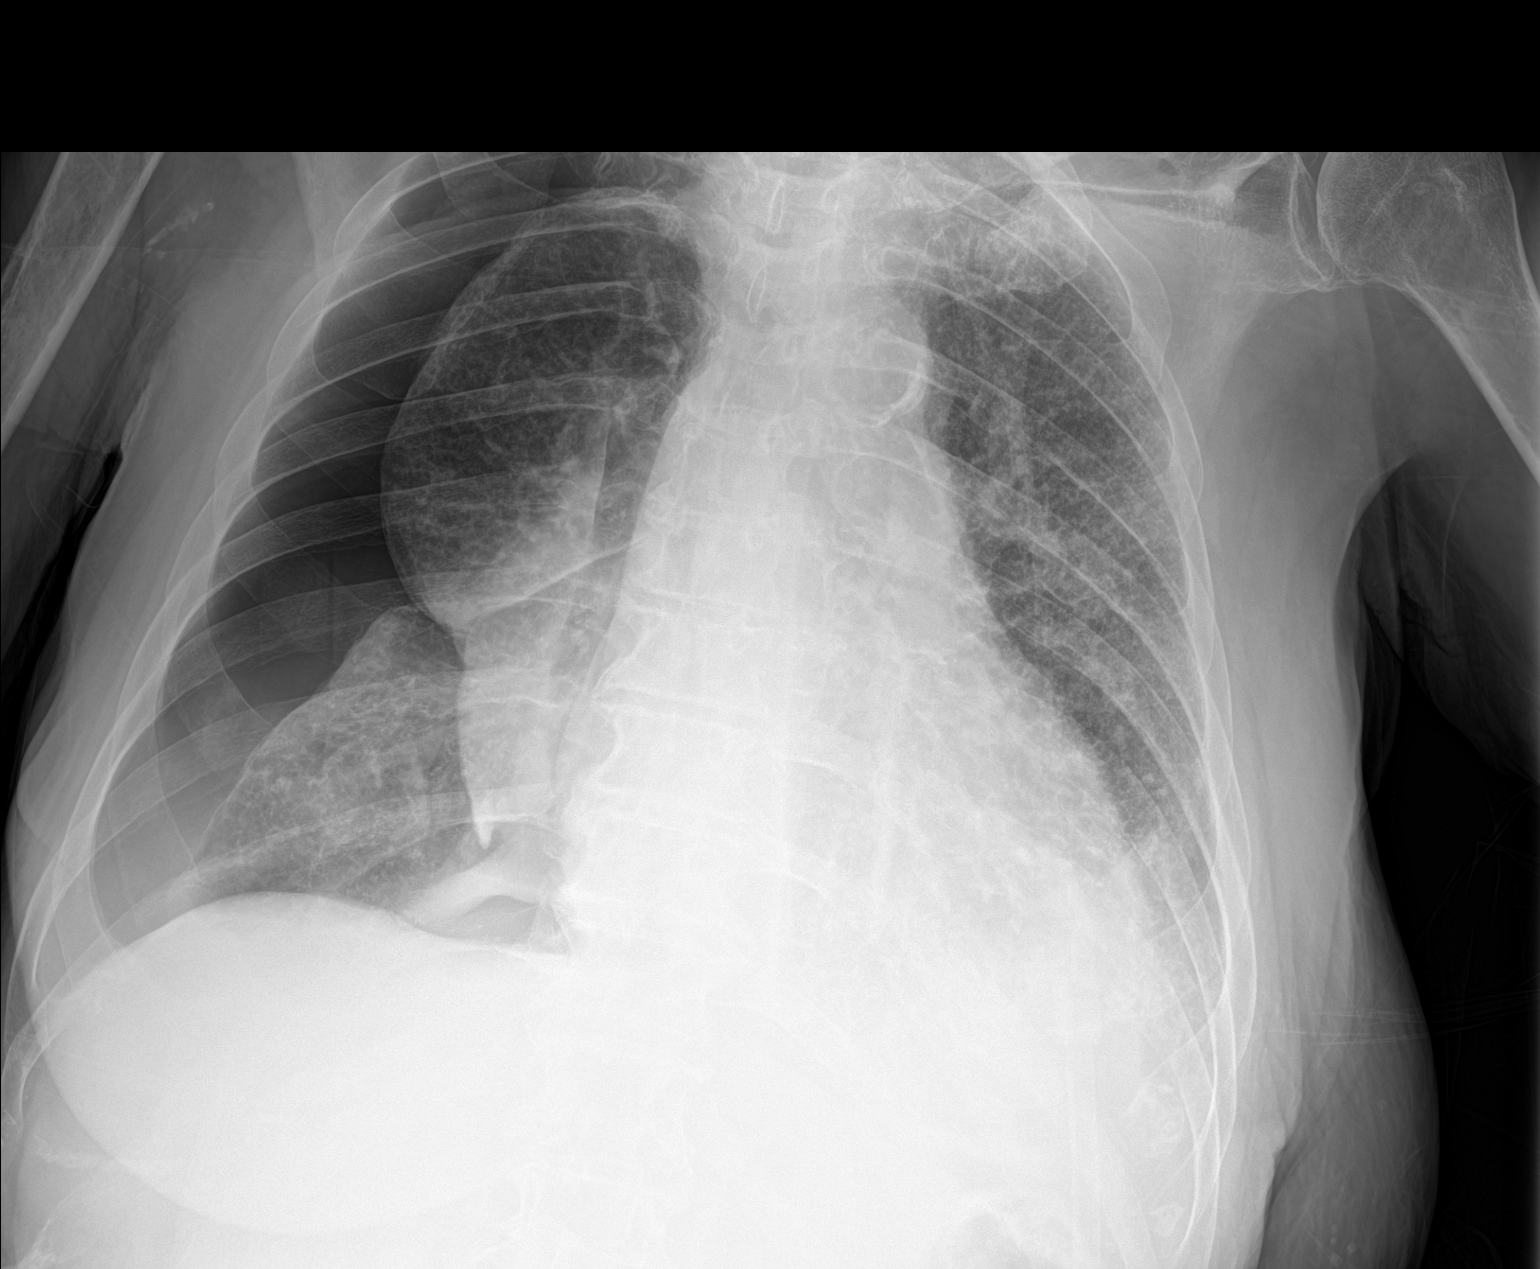

[1 of 1 positions shown; findings below may reference images not displayed]

FINDINGS: Interval increase in right-sided pneumothorax, now large. There is
evidence of leftward mediastinal shift. Adjacent compressive
atelectasis in the right lung. Redemonstrated left lung base
opacities with suspected small left pleural effusion. Similar size
of the cardiomediastinal silhouette. Polyarticular degenerative
change.

Call of report is in progress at the time of dictation.
IMPRESSION: 1. Interval increase in right-sided pneumothorax, now large. There
is evidence of leftward mediastinal shift, raising concern for
potential tension.
2. Redemonstrated left lung base opacities with small left pleural
effusion.

ADDENDUM:
Findings discussed with Dr. Yama via telephone at [DATE] a.m.

*** End of Addendum ***
FINDINGS: Interval increase in right-sided pneumothorax, now large. There is
evidence of leftward mediastinal shift. Adjacent compressive
atelectasis in the right lung. Redemonstrated left lung base
opacities with suspected small left pleural effusion. Similar size
of the cardiomediastinal silhouette. Polyarticular degenerative
change.

Call of report is in progress at the time of dictation.
IMPRESSION: 1. Interval increase in right-sided pneumothorax, now large. There
is evidence of leftward mediastinal shift, raising concern for
potential tension.
2. Redemonstrated left lung base opacities with small left pleural
effusion.

## 2022-12-12 IMAGING — DX DG CHEST 1V PORT
1 series · 1 of 1 positions shown · non-contrast
Comparison: Radiograph 03/30/2022

CLINICAL DATA: Right chest tube placement

EXAM:
PORTABLE CHEST 1 VIEW

[chest ap]
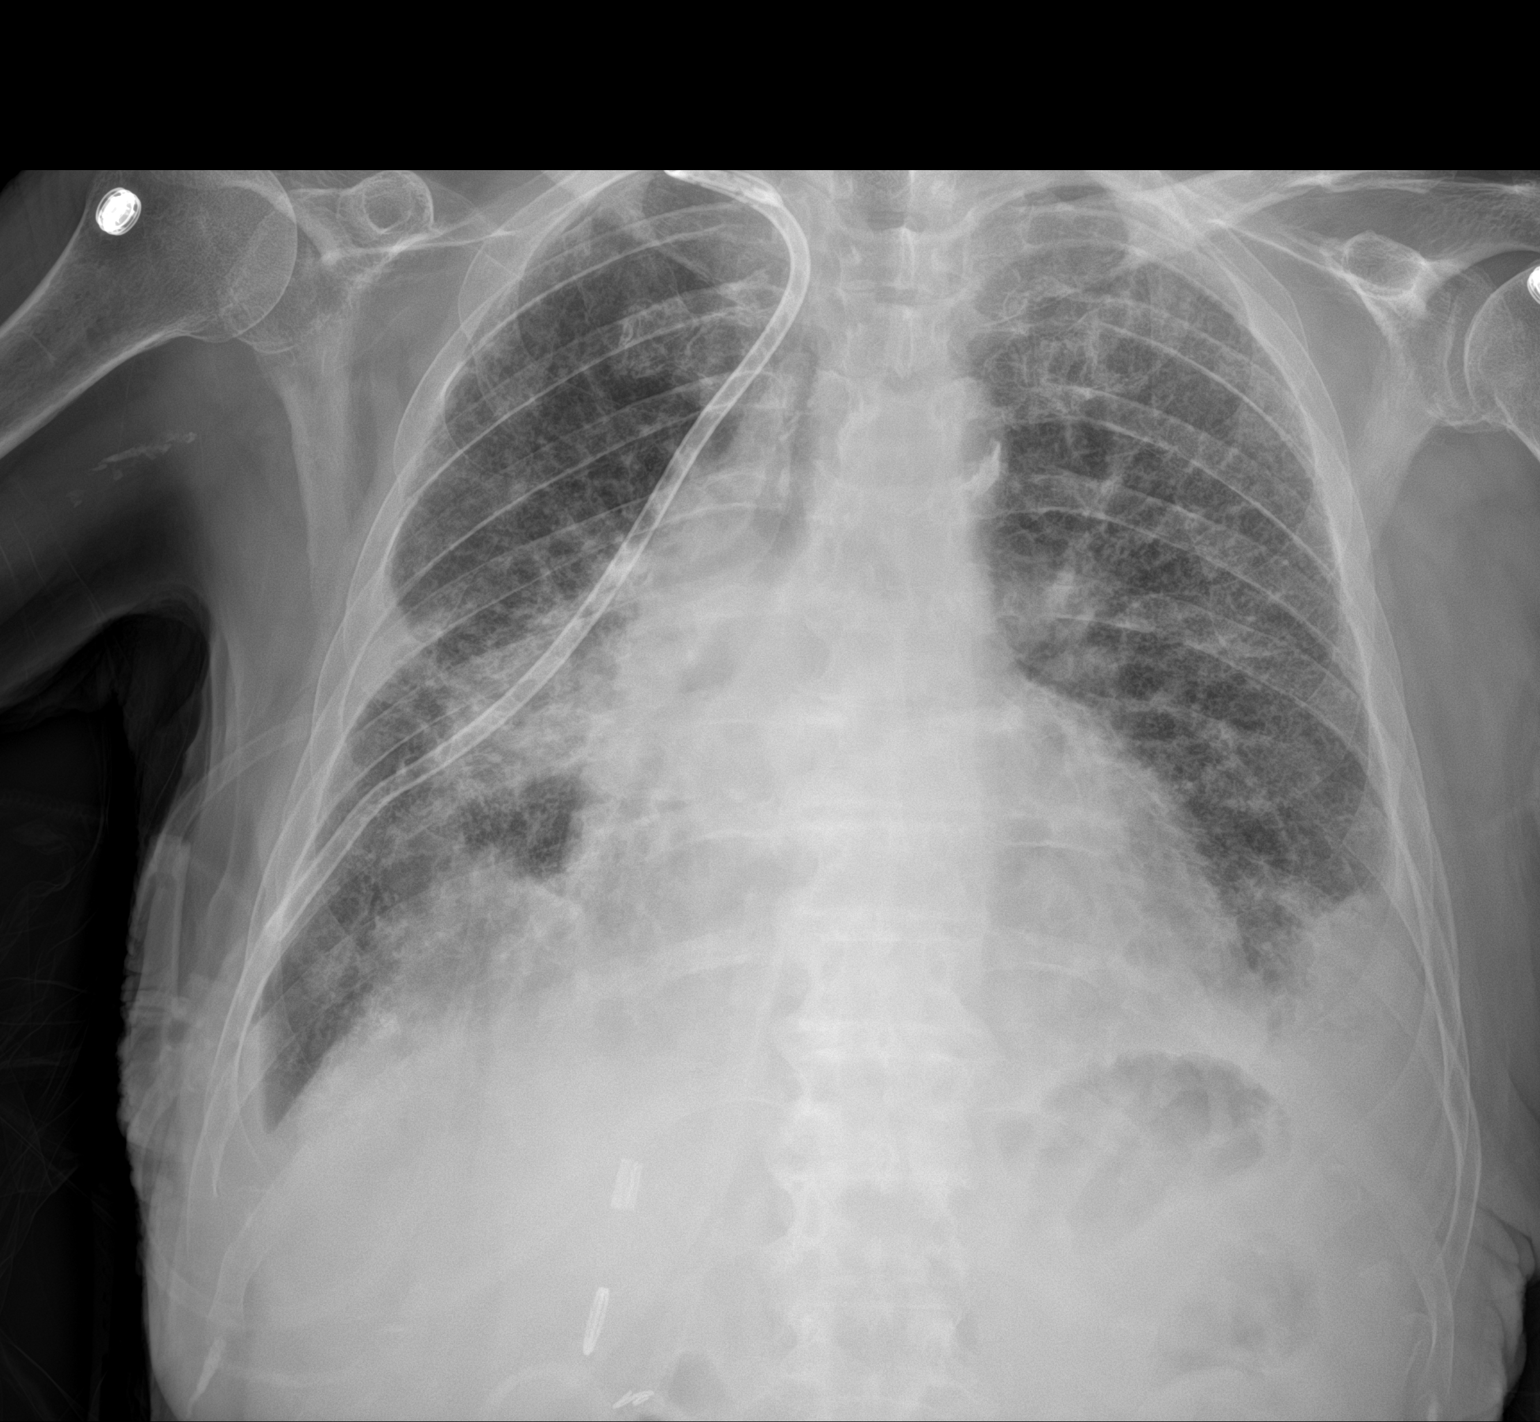

[1 of 1 positions shown; findings below may reference images not displayed]

FINDINGS: Interval placement of a right apical pigtail chest tube. Decreased
pneumothorax with trace residual pleural gas in the right apex.
Persistent bilateral interstitial and airspace disease, and small
bilateral pleural effusions.
IMPRESSION: Trace residual right apical pneumothorax after chest tube placement.

Persistent bilateral interstitial and airspace disease and small
bilateral pleural effusions.

## 2022-12-12 IMAGING — DX DG CHEST 1V PORT
1 series · 1 of 1 positions shown · non-contrast
Comparison: 03/29/2022 at 3131 hours

CLINICAL DATA: Chest tube placement

EXAM:
PORTABLE CHEST 1 VIEW

[chest ap]
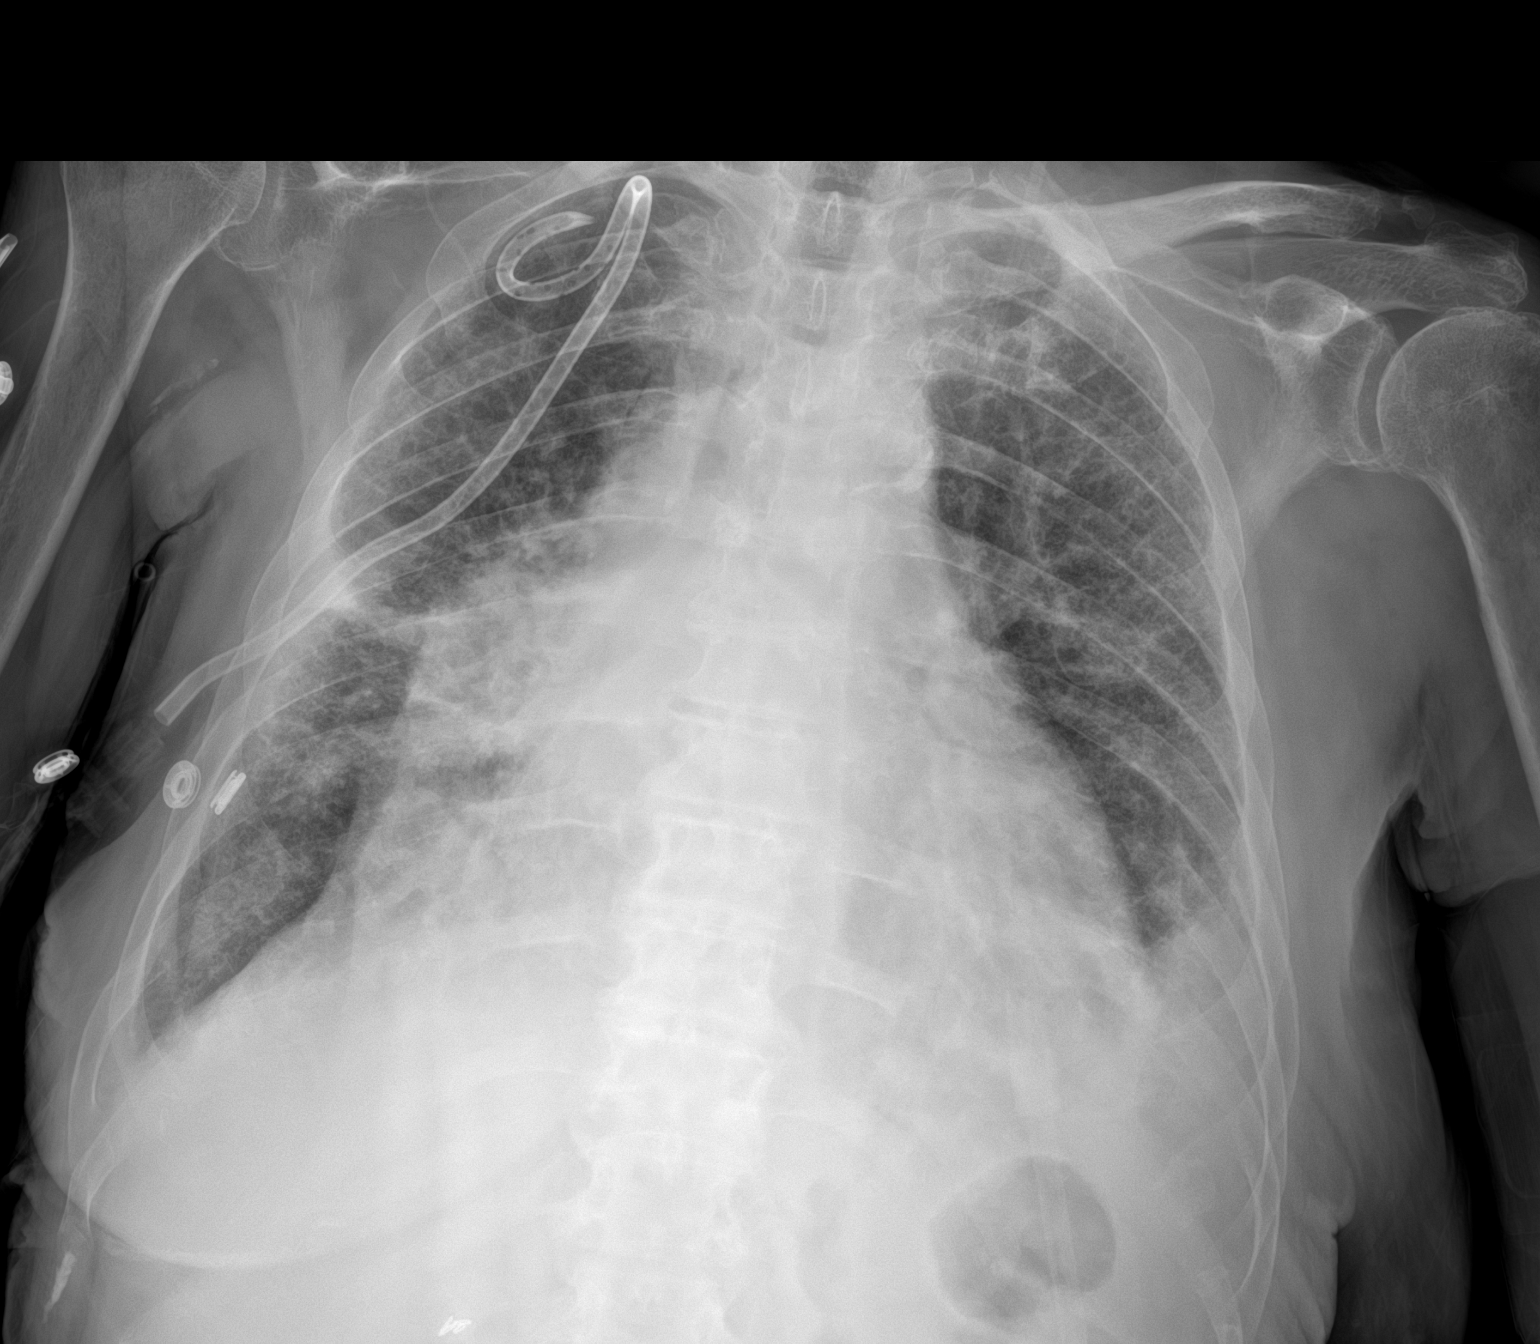

[1 of 1 positions shown; findings below may reference images not displayed]

FINDINGS: Interval placement of right-sided pigtail pleural drainage catheter.
Re-expansion of the right lung with trace residual right-sided
pneumothorax, less than 5% volume. Hazy opacities within the right
lung, likely atelectasis. Persistent left basilar opacity with small
left pleural effusion. No left-sided pneumothorax. Trachea is
midline. Stable cardiomegaly. Aortic atherosclerosis.
IMPRESSION: Interval placement of right-sided chest tube with re-expansion of
the right lung, trace residual right-sided pneumothorax, less than
5% volume.

## 2022-12-13 IMAGING — DX DG CHEST 1V PORT
1 series · 1 of 1 positions shown · non-contrast
Comparison: None March 29, 2022.

CLINICAL DATA: A 70-year-old female presents for evaluation of
hypoxia.

EXAM:
PORTABLE CHEST 1 VIEW

[chest ap]
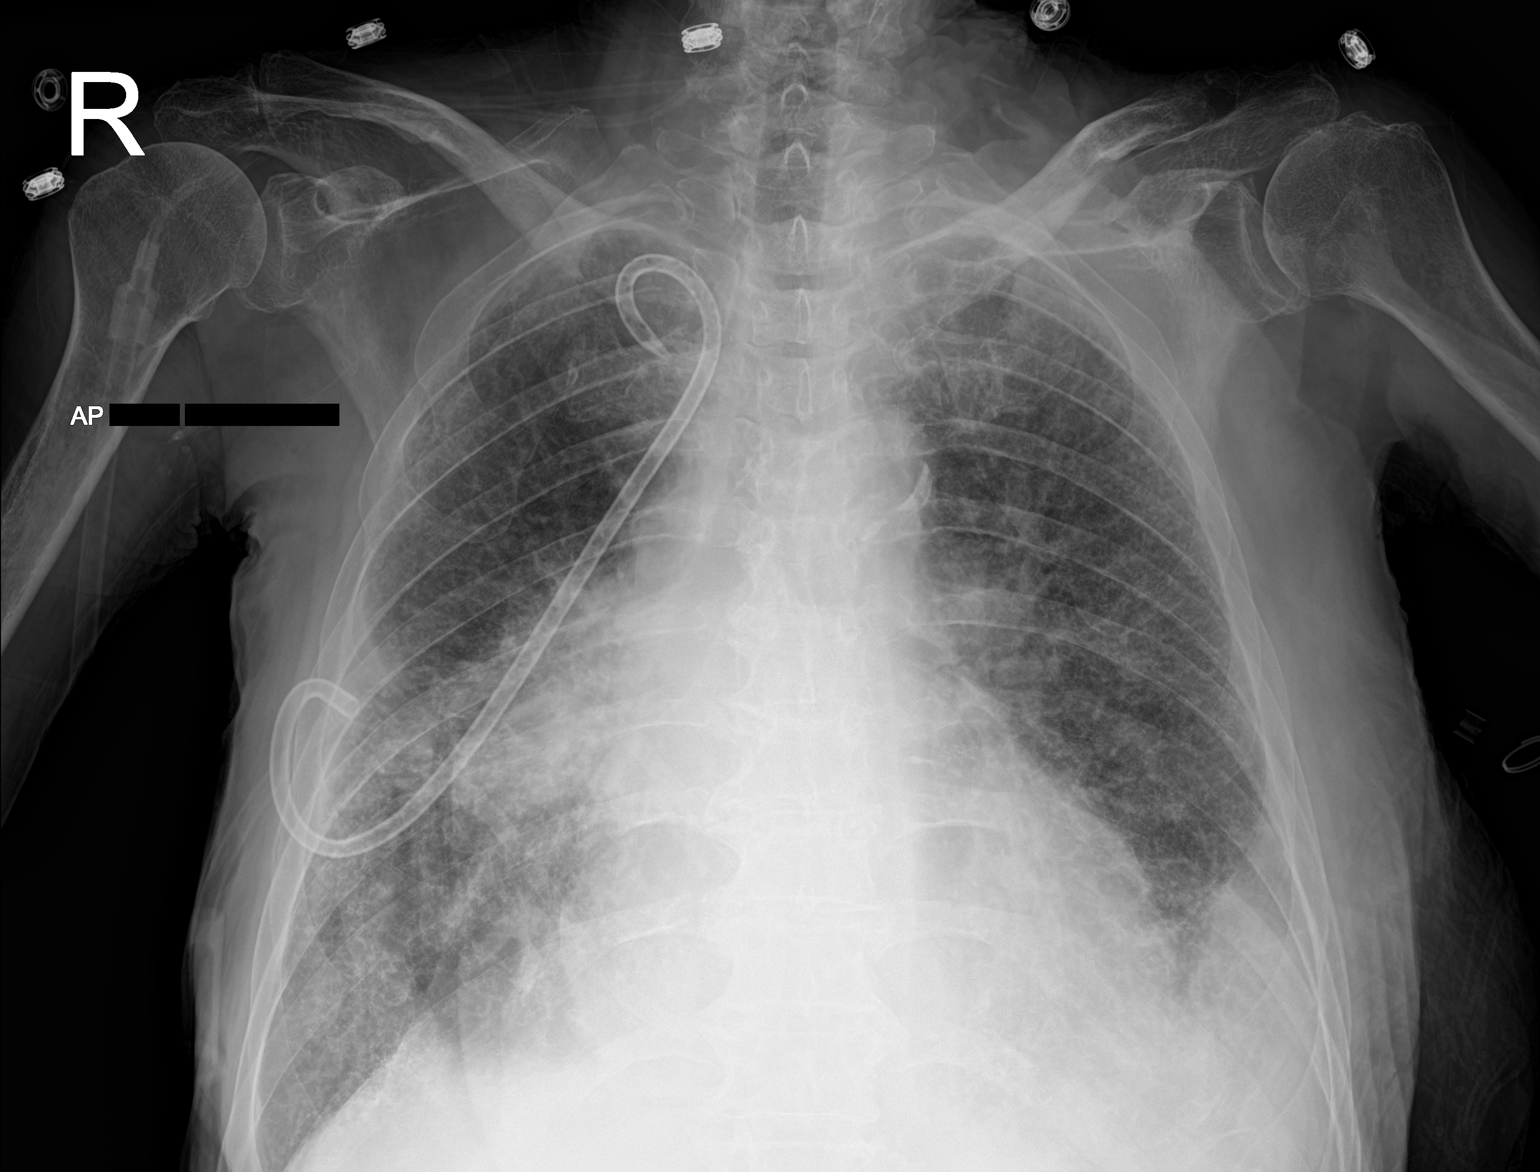

[1 of 1 positions shown; findings below may reference images not displayed]

FINDINGS: Trachea midline. Cardiomediastinal contours and hilar structures are
stable with known RIGHT perihilar masslike changes and increased
interstitial markings. A RIGHT-sided chest tube remains in place,
tip over the RIGHT lung apex.

Subtle lucency at the RIGHT lower chest may reflect small anterior
or basilar pneumothorax though on this upright view no definitive
signs of apical component.

Interstitial changes are similar to prior imaging.

On limited assessment there is no acute skeletal process.
IMPRESSION: 1. Stable RIGHT perihilar masslike changes and increased
interstitial markings.
2. Subtle lucency at the RIGHT lower chest may reflect small
anterior or basilar pneumothorax though on this upright view no
definitive signs of apical component.
Chest tube in stable position.
3. Stable interstitial changes in the lungs.

## 2022-12-13 IMAGING — DX DG CHEST 1V PORT
1 series · 1 of 1 positions shown · non-contrast
Comparison: AP chest 03/30/2022 at [DATE] a.m.; AP chest 03/29/2022;
AP chest 03/03/2021; CT chest 03/26/2022

CLINICAL DATA: Chest tube in place.

EXAM:
PORTABLE CHEST 1 VIEW

[chest]
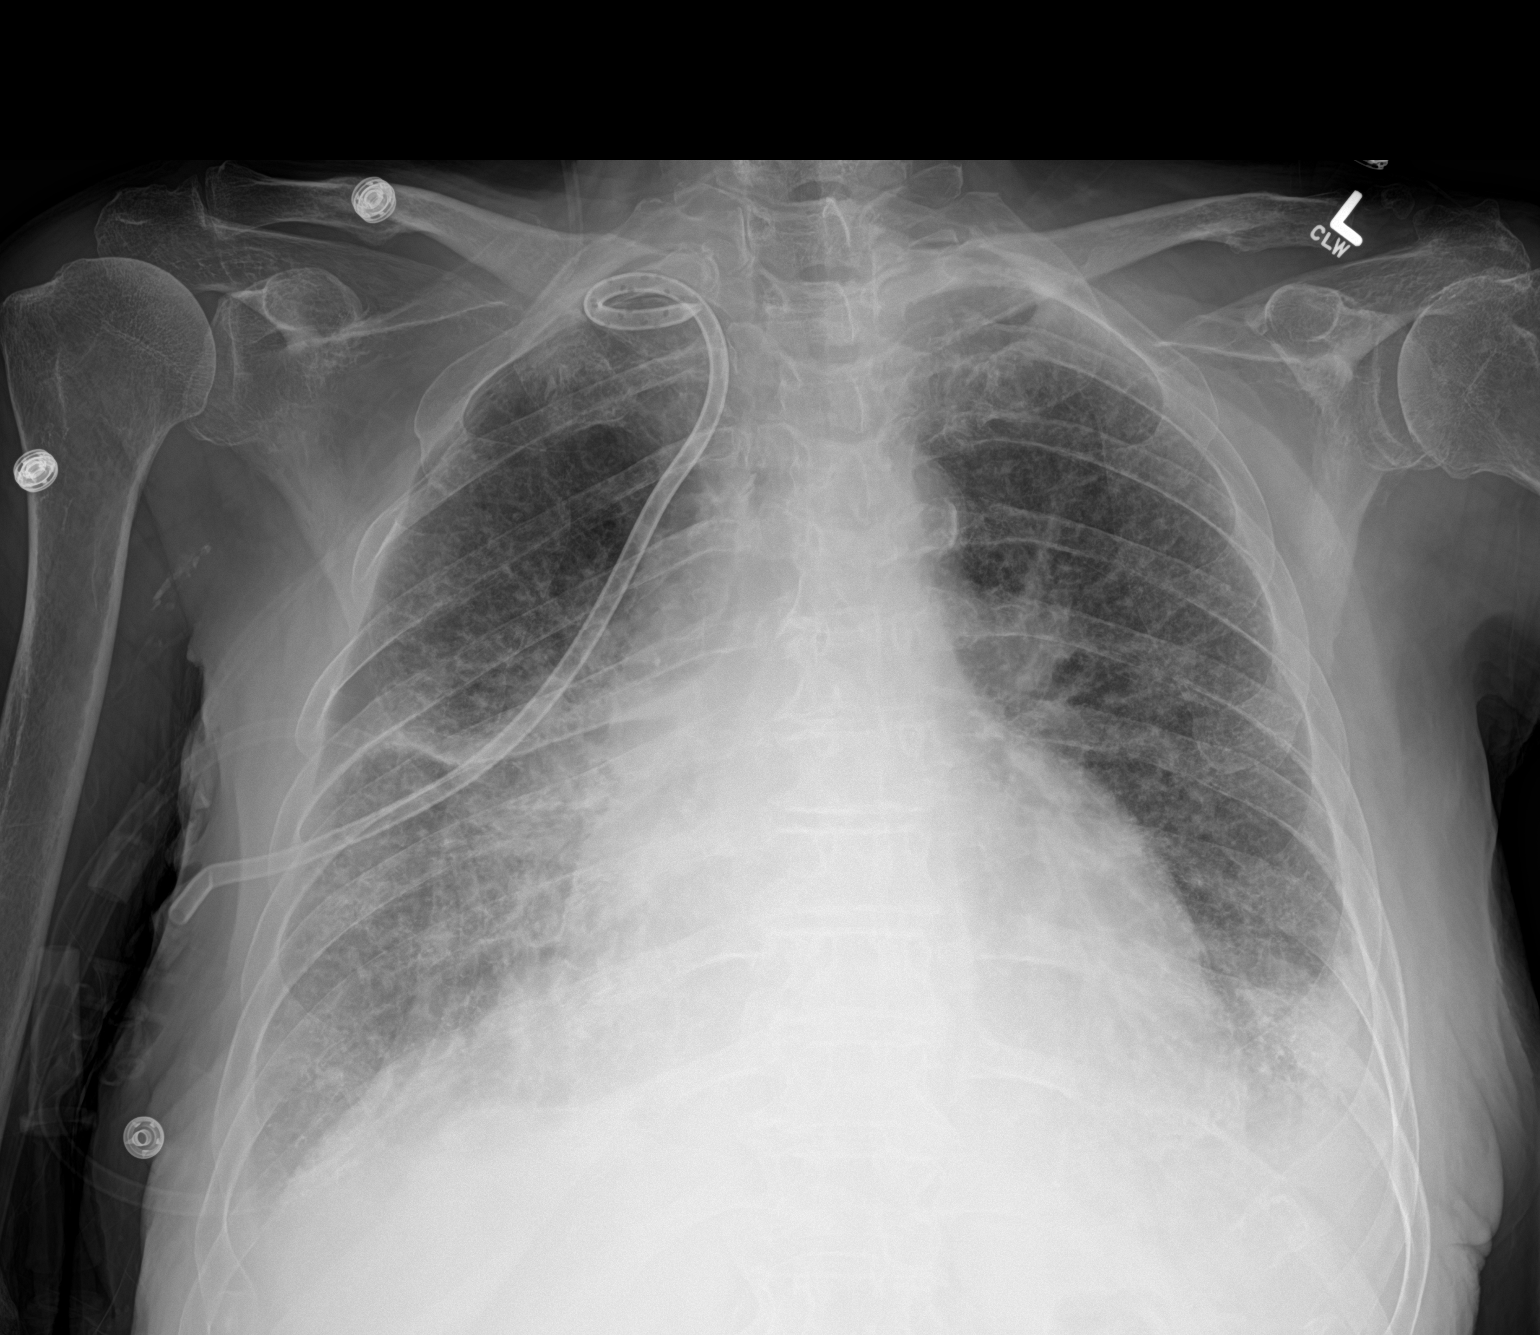

[1 of 1 positions shown; findings below may reference images not displayed]

FINDINGS: 03/30/2022 at [DATE] a.m. Redemonstration of right-sided chest tube
with pigtail overlying the right lung apex. This appears unchanged
from 03/29/2022. Slight difference in shape likely due to more
lordotic positioning on the current radiograph and 03/29/2022.

Cardiac silhouette is again mildly enlarged. Mediastinal contours
are within normal limits with mild calcification within aortic arch.
Moderate bilateral chronic interstitial thickening. Increased
bibasilar heterogeneous airspace opacities compared to 03/03/2021,
unchanged from most recent prior radiographs. Known right perihilar
masslike changes on recent CT.

No definite pneumothorax is seen. The subtle lucency overlying the
right hemidiaphragm on most recent radiograph is not seen on the
current radiograph.

At least small bilateral pleural effusions, appearing moderate on
the right and mild-to-moderate on the left on 03/26/2022 CT.
Moderate multilevel degenerative disc changes of the thoracic spine.
IMPRESSION: 1. No pneumothorax is seen. The subtle lucency overlying the right
hemidiaphragm on most recent radiograph is not seen on the current
radiograph. Unchanged right-sided chest tube.
2. Unchanged right perihilar masslike density.
3. Unchanged bibasilar heterogeneous opacities and small bilateral
pleural effusions.

## 2022-12-14 IMAGING — DX DG CHEST 1V PORT
1 series · 2 of 2 positions shown · non-contrast
Comparison: Previous studies including the examination done earlier
today

CLINICAL DATA: Pneumothorax

EXAM:
PORTABLE CHEST 1 VIEW

[Series 1: chest · 0.14mm/px · 2 of 2 slices shown]
[im 1/2]
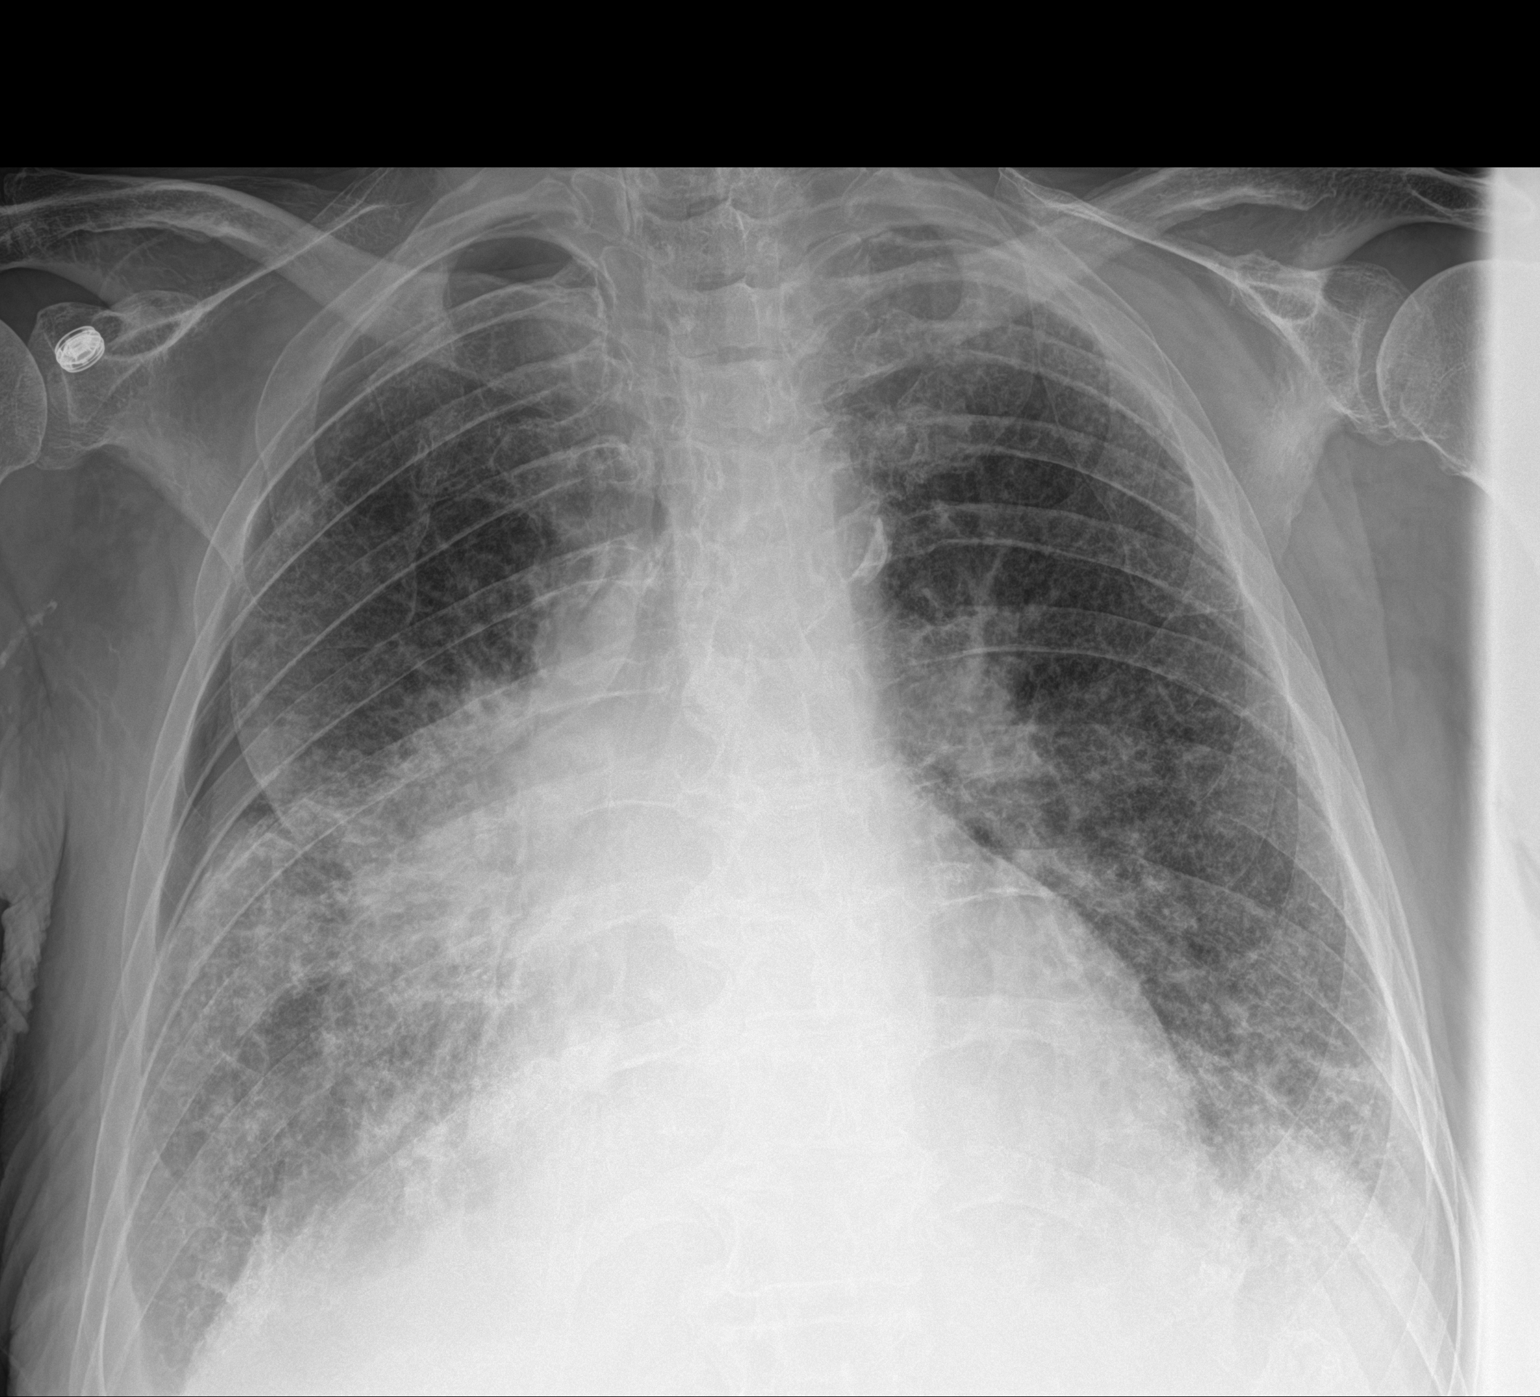
[im 2/2]
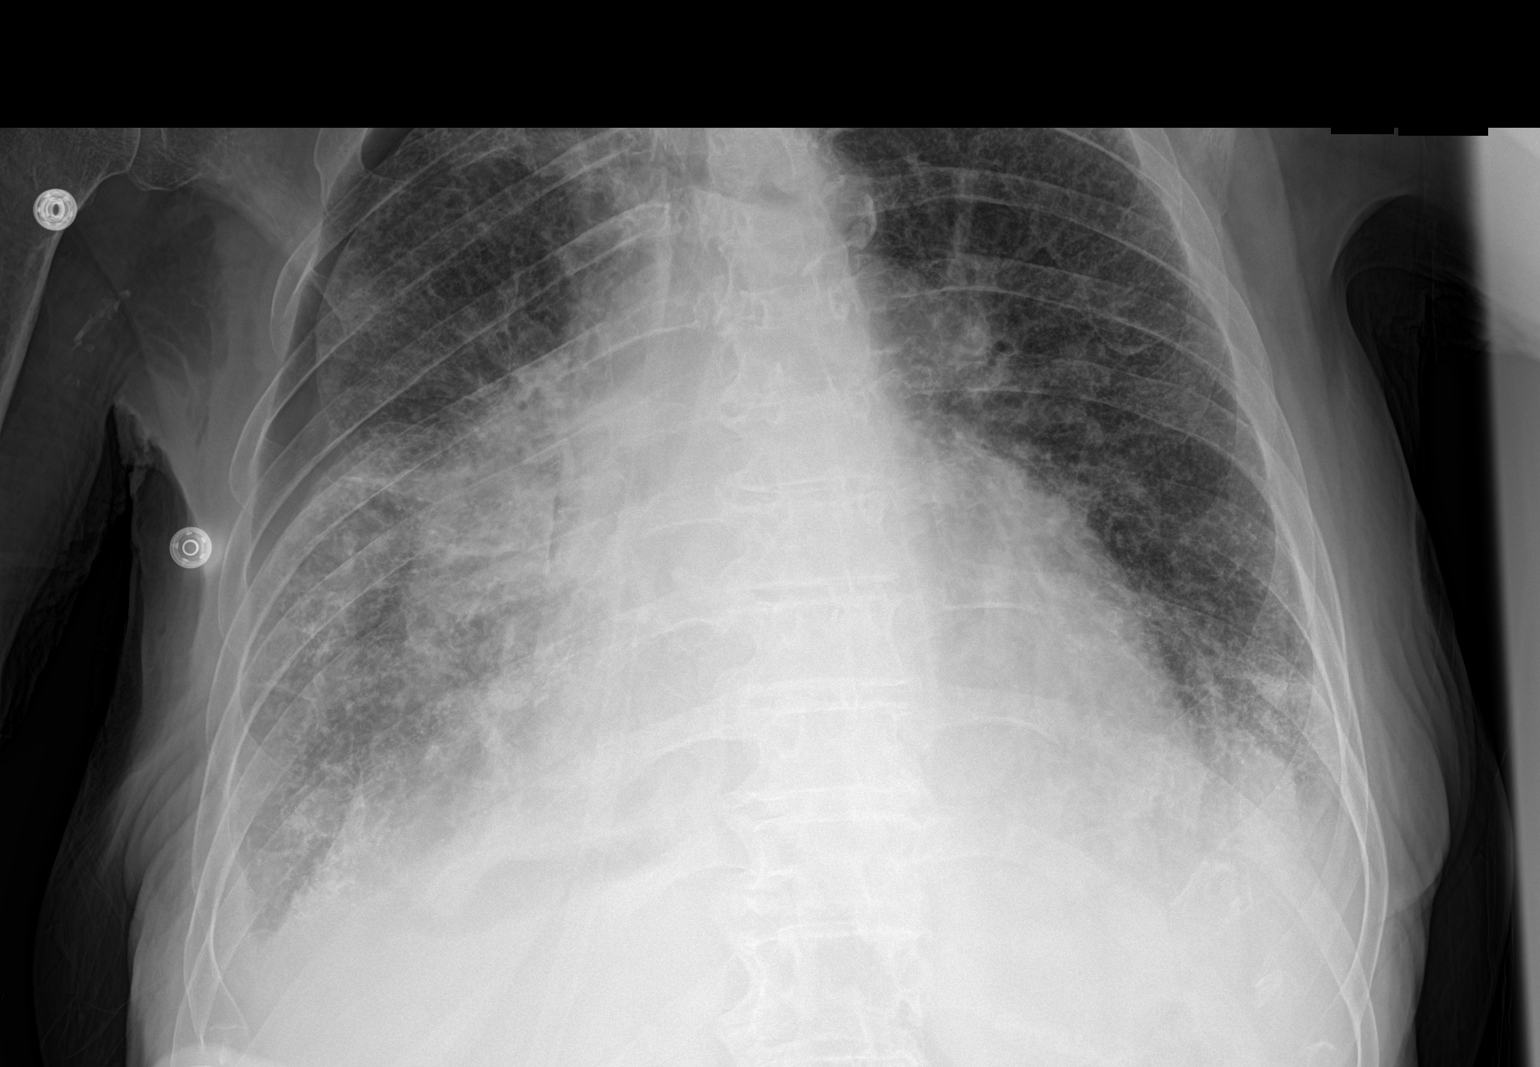

[2 of 2 positions shown; findings below may reference images not displayed]

FINDINGS: There is slight interval increase in pneumothorax in the periphery
of right upper lung fields. Transverse diameter of heart is
increased. Central pulmonary vessels are prominent. Increased
interstitial markings are seen in the parahilar regions and lower
lung fields with no significant interval change. Small bilateral
pleural effusions are seen.
IMPRESSION: There is slight interval increase in size of right pneumothorax.

Cardiomegaly. Central pulmonary vessels are prominent suggesting
CHF. Increased interstitial markings are seen in both lungs, more so
on the right side suggesting asymmetric pulmonary edema or
pneumonia. Small bilateral pleural effusions.

## 2022-12-14 IMAGING — DX DG CHEST 1V PORT
1 series · 1 of 1 positions shown · non-contrast
Comparison: 03/30/2022, 03/29/2022 and 03/28/2022

CLINICAL DATA: Chest tube in place.

EXAM:
PORTABLE CHEST 1 VIEW

[chest]
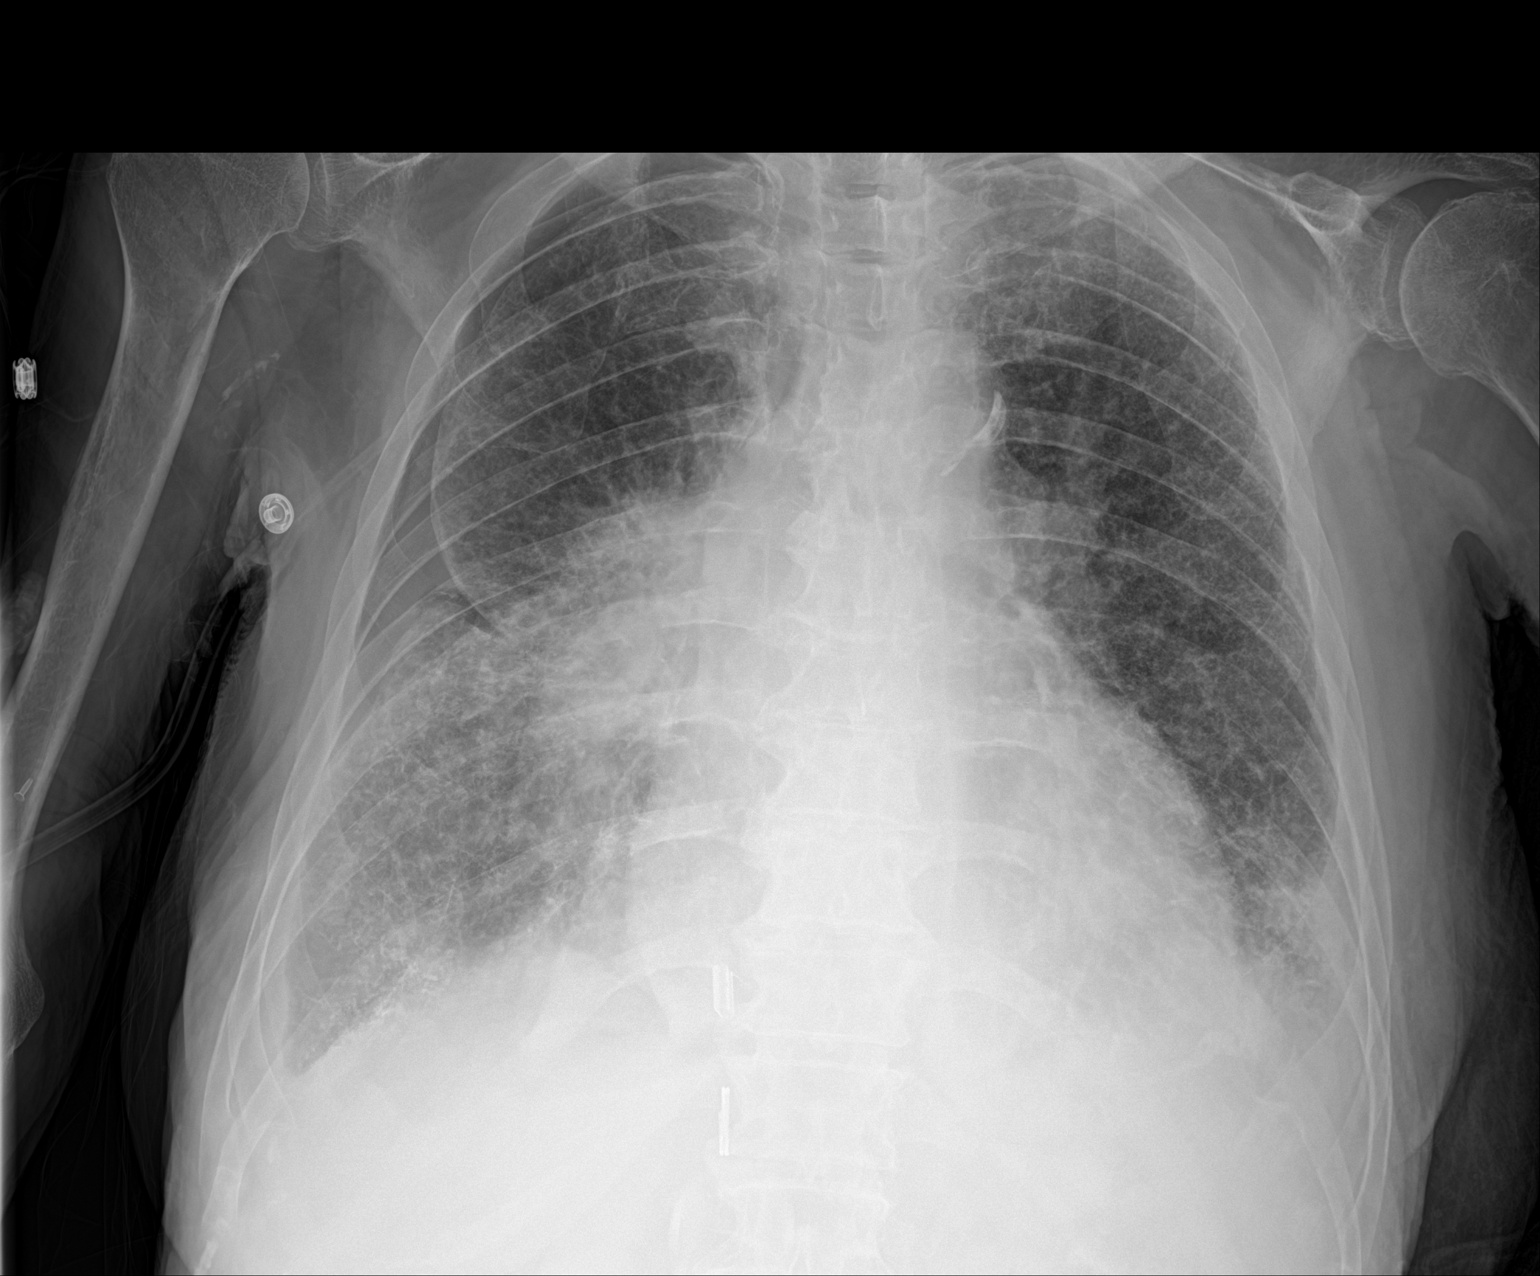

[1 of 1 positions shown; findings below may reference images not displayed]

FINDINGS: Interval removal of patient's right apical chest tube. Minimal
interval worsening of small right-sided pneumothorax compared to
03/30/2022 although improved compared to 03/28/2022. Stable
interstitial changes bilaterally. Suggestion of small bilateral
pleural effusions slightly worse on the right and unchanged on the
left. Stable right perihilar opacification. Mild stable
cardiomegaly. Two metallic densities which are possible surgical
clips overlie the medial right lung base, medial upper abdomen.
Remainder of the exam is unchanged.
IMPRESSION: 1. Interval removal right apical chest tube with slight interval
worsening of small right-sided pneumothorax compared to 03/30/2022,
although improved compared to 03/28/2022.
2. Stable bilateral interstitial changes and right perihilar
opacification. Small bilateral pleural effusions slightly worse on
the right and unchanged on the left.

## 2022-12-15 IMAGING — DX DG CHEST 1V PORT
1 series · 1 of 1 positions shown · non-contrast
Comparison: Chest x-ray 03/31/2022.

CLINICAL DATA: 70-year-old female with history of pneumothorax.

EXAM:
PORTABLE CHEST 1 VIEW

[chest]
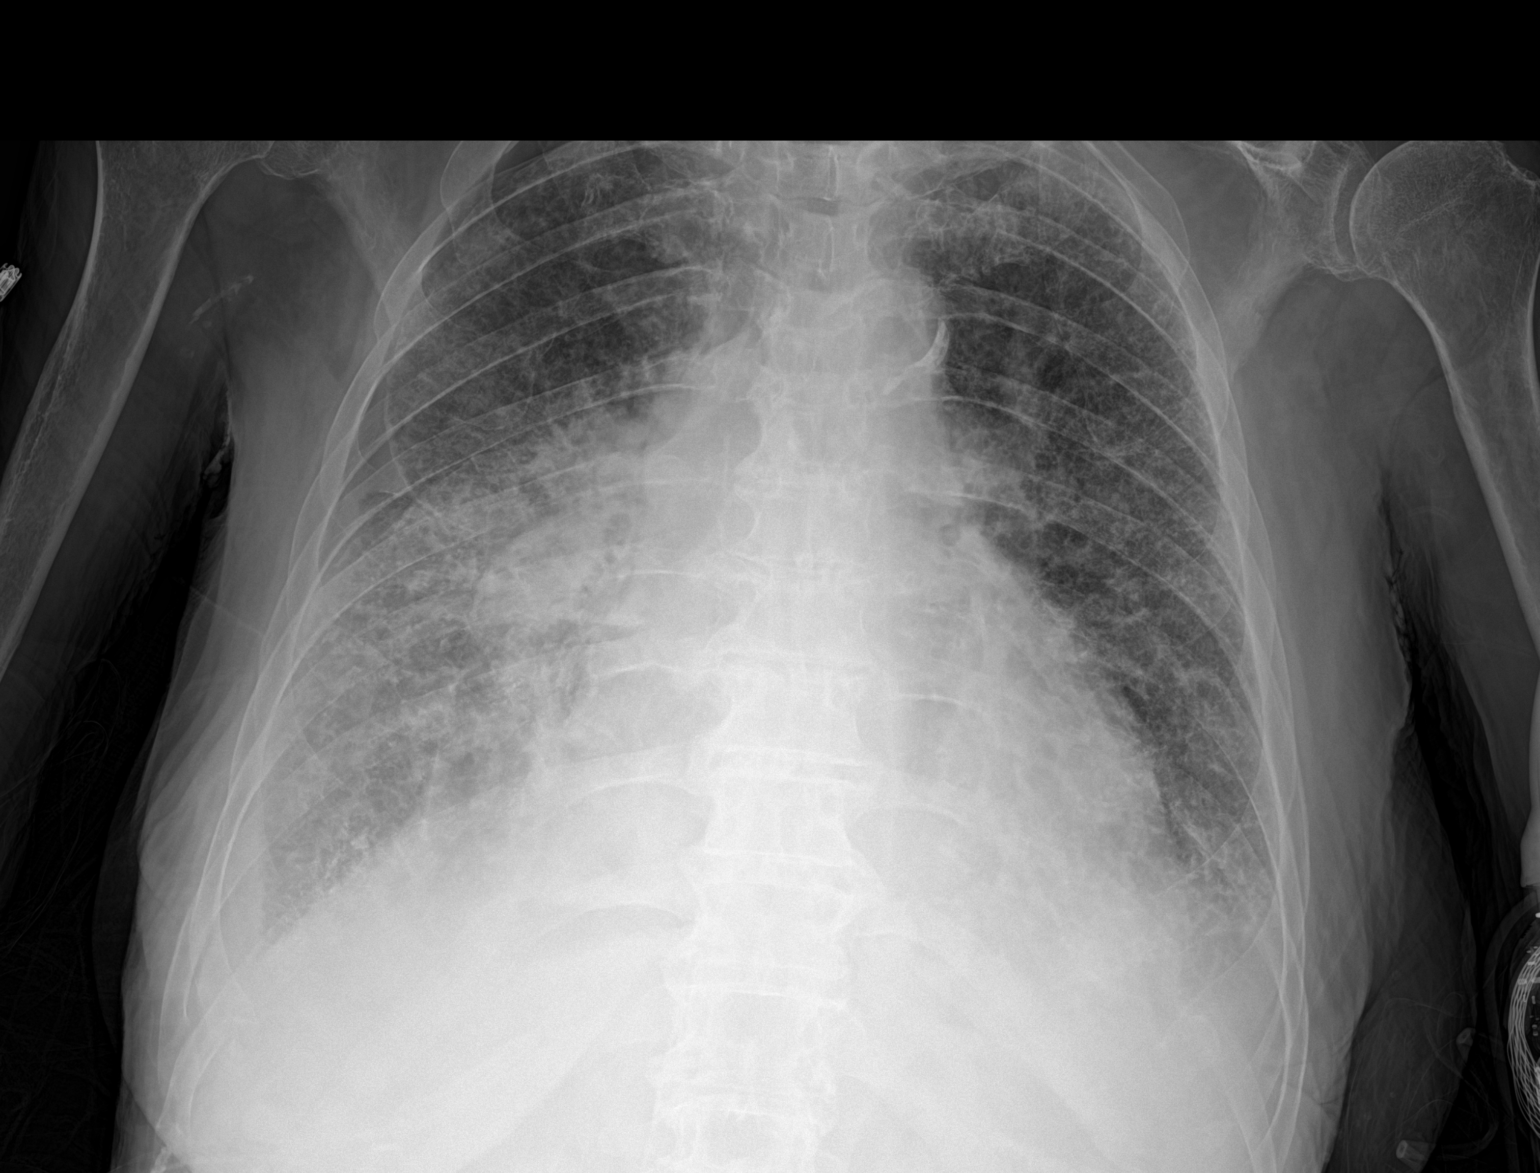

[1 of 1 positions shown; findings below may reference images not displayed]

FINDINGS: Persistent small right apical pneumothorax appears unchanged. There
is also evidence of a small volume of right-sided pleural fluid. No
left pneumothorax. Widespread areas of interstitial prominence and
peribronchial cuffing are noted throughout the lungs bilaterally,
likely related in part to chronic interstitial lung disease given
similar to prior studies, however, there is also cephalization of
the pulmonary vasculature and some patchy ill-defined opacities such
that superimposed pulmonary edema is not excluded. Notably, the
aeration is asymmetrically worse in the right mid to lower lung as
well, such that superimposed infection is not excluded. Mild
cardiomegaly. Mass-like fullness in the right perihilar region.
Upper mediastinal contours are within normal limits. Atherosclerotic
calcifications are noted in the thoracic aorta.
IMPRESSION: 1. Right-sided hydropneumothorax with stable pneumothorax component.
2. Small left pleural effusion.
3. The appearance of the lungs is unusual, complicated by
pre-existing interstitial lung disease. Findings may suggest
pulmonary edema, but are asymmetric including potential areas of
infectious consolidation throughout the right mid to lower lung.
Additionally, there is mass-like fullness in the perihilar aspect of
the right hemithorax. The possibility of centrally obstructing
neoplasm should be considered. Further evaluation with contrast
enhanced chest CT is recommended at this time if clinically
appropriate.
4. Aortic atherosclerosis.

1.

## 2023-09-04 NOTE — Telephone Encounter (Signed)
Telephone call
